# Patient Record
Sex: Male | Born: 1980 | Hispanic: No | Marital: Single
Health system: Southern US, Academic
[De-identification: ages and names within clinical notes are randomized; demographics above are authoritative.]

## PROBLEM LIST (undated history)

## (undated) ENCOUNTER — Ambulatory Visit
Payer: MEDICARE | Attending: Student in an Organized Health Care Education/Training Program | Primary: Student in an Organized Health Care Education/Training Program

## (undated) ENCOUNTER — Ambulatory Visit

## (undated) ENCOUNTER — Ambulatory Visit: Payer: MEDICARE

## (undated) ENCOUNTER — Telehealth

## (undated) ENCOUNTER — Encounter

## (undated) ENCOUNTER — Ambulatory Visit: Payer: MEDICARE | Attending: Pulmonary Disease | Primary: Pulmonary Disease

## (undated) ENCOUNTER — Inpatient Hospital Stay

## (undated) ENCOUNTER — Ambulatory Visit: Attending: Otolaryngology | Primary: Otolaryngology

## (undated) ENCOUNTER — Encounter: Attending: Family | Primary: Family

## (undated) ENCOUNTER — Ambulatory Visit: Payer: MEDICARE | Attending: Otolaryngology | Primary: Otolaryngology

## (undated) ENCOUNTER — Ambulatory Visit: Payer: MEDICARE | Attending: Nephrology | Primary: Nephrology

## (undated) ENCOUNTER — Encounter: Attending: Cardiovascular Disease | Primary: Cardiovascular Disease

## (undated) ENCOUNTER — Encounter: Attending: Pulmonary Disease | Primary: Pulmonary Disease

## (undated) ENCOUNTER — Encounter: Attending: Nephrology | Primary: Nephrology

## (undated) ENCOUNTER — Ambulatory Visit: Attending: Family | Primary: Family

## (undated) ENCOUNTER — Other Ambulatory Visit

## (undated) ENCOUNTER — Ambulatory Visit: Payer: MEDICARE | Attending: Cardiovascular Disease | Primary: Cardiovascular Disease

## (undated) ENCOUNTER — Encounter
Attending: Student in an Organized Health Care Education/Training Program | Primary: Student in an Organized Health Care Education/Training Program

## (undated) ENCOUNTER — Telehealth: Attending: Family | Primary: Family

## (undated) ENCOUNTER — Ambulatory Visit: Payer: MEDICARE | Attending: Surgery | Primary: Surgery

## (undated) ENCOUNTER — Telehealth: Attending: Otolaryngology | Primary: Otolaryngology

## (undated) ENCOUNTER — Encounter: Attending: Surgery | Primary: Surgery

## (undated) ENCOUNTER — Telehealth: Attending: Internal Medicine | Primary: Internal Medicine

## (undated) ENCOUNTER — Ambulatory Visit: Payer: MEDICARE | Attending: "Endocrinology | Primary: "Endocrinology

## (undated) ENCOUNTER — Telehealth: Attending: Nephrology | Primary: Nephrology

## (undated) ENCOUNTER — Ambulatory Visit: Attending: Nephrology | Primary: Nephrology

## (undated) DIAGNOSIS — I10 Essential (primary) hypertension: Secondary | ICD-10-CM

## (undated) DIAGNOSIS — R509 Fever, unspecified: Secondary | ICD-10-CM

## (undated) DIAGNOSIS — A630 Anogenital (venereal) warts: Secondary | ICD-10-CM

## (undated) DIAGNOSIS — B20 Human immunodeficiency virus [HIV] disease: Secondary | ICD-10-CM

## (undated) DIAGNOSIS — J189 Pneumonia, unspecified organism: Secondary | ICD-10-CM

## (undated) DIAGNOSIS — J45909 Unspecified asthma, uncomplicated: Secondary | ICD-10-CM

## (undated) DIAGNOSIS — R51 Headache: Secondary | ICD-10-CM

## (undated) DIAGNOSIS — Z91018 Allergy to other foods: Secondary | ICD-10-CM

## (undated) DIAGNOSIS — Z992 Dependence on renal dialysis: Secondary | ICD-10-CM

## (undated) DIAGNOSIS — Z23 Encounter for immunization: Secondary | ICD-10-CM

## (undated) DIAGNOSIS — Z21 Asymptomatic human immunodeficiency virus [HIV] infection status: Secondary | ICD-10-CM

## (undated) DIAGNOSIS — B029 Zoster without complications: Secondary | ICD-10-CM

## (undated) DIAGNOSIS — R519 Headache, unspecified: Secondary | ICD-10-CM

## (undated) DIAGNOSIS — I82409 Acute embolism and thrombosis of unspecified deep veins of unspecified lower extremity: Secondary | ICD-10-CM

## (undated) DIAGNOSIS — Z9109 Other allergy status, other than to drugs and biological substances: Secondary | ICD-10-CM

## (undated) DIAGNOSIS — R569 Unspecified convulsions: Secondary | ICD-10-CM

## (undated) DIAGNOSIS — M79606 Pain in leg, unspecified: Secondary | ICD-10-CM

## (undated) DIAGNOSIS — L0390 Cellulitis, unspecified: Secondary | ICD-10-CM

## (undated) DIAGNOSIS — N186 End stage renal disease: Secondary | ICD-10-CM

## (undated) HISTORY — PX: FRACTURE SURGERY: SHX138

## (undated) HISTORY — DX: Encounter for immunization: Z23

## (undated) HISTORY — DX: Anogenital (venereal) warts: A63.0

---
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
## 2002-04-06 ENCOUNTER — Emergency Department (HOSPITAL_COMMUNITY): Admission: EM | Admit: 2002-04-06 | Discharge: 2002-04-06 | Payer: Self-pay | Admitting: Emergency Medicine

## 2002-04-06 ENCOUNTER — Encounter: Payer: Self-pay | Admitting: Emergency Medicine

## 2005-05-18 ENCOUNTER — Emergency Department (HOSPITAL_COMMUNITY): Admission: EM | Admit: 2005-05-18 | Discharge: 2005-05-18 | Payer: Self-pay | Admitting: Emergency Medicine

## 2005-05-20 ENCOUNTER — Emergency Department (HOSPITAL_COMMUNITY): Admission: EM | Admit: 2005-05-20 | Discharge: 2005-05-21 | Payer: Self-pay | Admitting: Emergency Medicine

## 2008-04-10 ENCOUNTER — Emergency Department (HOSPITAL_COMMUNITY): Admission: EM | Admit: 2008-04-10 | Discharge: 2008-04-10 | Payer: Self-pay | Admitting: Emergency Medicine

## 2009-10-19 ENCOUNTER — Emergency Department (HOSPITAL_COMMUNITY): Admission: EM | Admit: 2009-10-19 | Discharge: 2009-10-19 | Payer: Self-pay | Admitting: Emergency Medicine

## 2010-07-30 ENCOUNTER — Emergency Department (HOSPITAL_COMMUNITY): Payer: BC Managed Care – PPO

## 2010-07-30 ENCOUNTER — Emergency Department (HOSPITAL_COMMUNITY)
Admission: EM | Admit: 2010-07-30 | Discharge: 2010-07-30 | Disposition: A | Discharge status: Home / Self Care | Payer: BC Managed Care – PPO | Attending: Emergency Medicine | Admitting: Emergency Medicine

## 2010-07-30 DIAGNOSIS — J45909 Unspecified asthma, uncomplicated: Secondary | ICD-10-CM | POA: Insufficient documentation

## 2010-07-30 DIAGNOSIS — R51 Headache: Secondary | ICD-10-CM | POA: Insufficient documentation

## 2010-07-30 DIAGNOSIS — R059 Cough, unspecified: Secondary | ICD-10-CM | POA: Insufficient documentation

## 2010-07-30 DIAGNOSIS — IMO0001 Reserved for inherently not codable concepts without codable children: Secondary | ICD-10-CM | POA: Insufficient documentation

## 2010-07-30 DIAGNOSIS — R079 Chest pain, unspecified: Secondary | ICD-10-CM | POA: Insufficient documentation

## 2010-07-30 DIAGNOSIS — R05 Cough: Secondary | ICD-10-CM | POA: Insufficient documentation

## 2010-07-30 DIAGNOSIS — J189 Pneumonia, unspecified organism: Secondary | ICD-10-CM | POA: Insufficient documentation

## 2010-07-30 LAB — POCT I-STAT, CHEM 8
BUN: 15 mg/dL (ref 6–23)
Calcium, Ion: 1.19 mmol/L (ref 1.12–1.32)
Chloride: 100 mEq/L (ref 96–112)
Creatinine, Ser: 1.4 mg/dL (ref 0.4–1.5)
Glucose, Bld: 134 mg/dL — ABNORMAL HIGH (ref 70–99)
HCT: 45 % (ref 39.0–52.0)
Hemoglobin: 15.3 g/dL (ref 13.0–17.0)
Potassium: 4.5 mEq/L (ref 3.5–5.1)
Sodium: 136 mEq/L (ref 135–145)
TCO2: 27 mmol/L (ref 0–100)

## 2010-07-30 LAB — CBC
HCT: 39.5 % (ref 39.0–52.0)
Hemoglobin: 14 g/dL (ref 13.0–17.0)
MCH: 29.2 pg (ref 26.0–34.0)
MCHC: 35.4 g/dL (ref 30.0–36.0)
MCV: 82.5 fL (ref 78.0–100.0)
Platelets: 148 10*3/uL — ABNORMAL LOW (ref 150–400)
RBC: 4.79 MIL/uL (ref 4.22–5.81)
RDW: 13.2 % (ref 11.5–15.5)
WBC: 16.3 10*3/uL — ABNORMAL HIGH (ref 4.0–10.5)

## 2010-07-30 LAB — DIFFERENTIAL
Basophils Absolute: 0 10*3/uL (ref 0.0–0.1)
Basophils Relative: 0 % (ref 0–1)
Eosinophils Absolute: 0 10*3/uL (ref 0.0–0.7)
Eosinophils Relative: 0 % (ref 0–5)
Lymphocytes Relative: 3 % — ABNORMAL LOW (ref 12–46)
Lymphs Abs: 0.4 10*3/uL — ABNORMAL LOW (ref 0.7–4.0)
Monocytes Absolute: 1 10*3/uL (ref 0.1–1.0)
Monocytes Relative: 6 % (ref 3–12)
Neutro Abs: 14.9 10*3/uL — ABNORMAL HIGH (ref 1.7–7.7)
Neutrophils Relative %: 91 % — ABNORMAL HIGH (ref 43–77)

## 2013-09-03 ENCOUNTER — Emergency Department (HOSPITAL_COMMUNITY): Payer: BC Managed Care – PPO

## 2013-09-03 ENCOUNTER — Emergency Department (HOSPITAL_COMMUNITY)
Admission: EM | Admit: 2013-09-03 | Discharge: 2013-09-03 | Disposition: A | Discharge status: Home / Self Care | Payer: BC Managed Care – PPO | Attending: Emergency Medicine | Admitting: Emergency Medicine

## 2013-09-03 ENCOUNTER — Encounter (HOSPITAL_COMMUNITY): Payer: Self-pay | Admitting: Emergency Medicine

## 2013-09-03 DIAGNOSIS — N12 Tubulo-interstitial nephritis, not specified as acute or chronic: Secondary | ICD-10-CM

## 2013-09-03 DIAGNOSIS — J45909 Unspecified asthma, uncomplicated: Secondary | ICD-10-CM | POA: Insufficient documentation

## 2013-09-03 DIAGNOSIS — Z79899 Other long term (current) drug therapy: Secondary | ICD-10-CM | POA: Insufficient documentation

## 2013-09-03 HISTORY — DX: Unspecified asthma, uncomplicated: J45.909

## 2013-09-03 LAB — I-STAT CHEM 8, ED
BUN: 6 mg/dL (ref 6–23)
Calcium, Ion: 1.19 mmol/L (ref 1.12–1.23)
Chloride: 104 mEq/L (ref 96–112)
Creatinine, Ser: 1 mg/dL (ref 0.50–1.35)
Glucose, Bld: 107 mg/dL — ABNORMAL HIGH (ref 70–99)
HCT: 42 % (ref 39.0–52.0)
Hemoglobin: 14.3 g/dL (ref 13.0–17.0)
Potassium: 4 mEq/L (ref 3.7–5.3)
Sodium: 139 mEq/L (ref 137–147)
TCO2: 24 mmol/L (ref 0–100)

## 2013-09-03 LAB — CBC WITH DIFFERENTIAL/PLATELET
Basophils Absolute: 0 10*3/uL (ref 0.0–0.1)
Basophils Relative: 0 % (ref 0–1)
Eosinophils Absolute: 0.1 10*3/uL (ref 0.0–0.7)
Eosinophils Relative: 3 % (ref 0–5)
HCT: 38.8 % — ABNORMAL LOW (ref 39.0–52.0)
Hemoglobin: 13.4 g/dL (ref 13.0–17.0)
Lymphocytes Relative: 25 % (ref 12–46)
Lymphs Abs: 1.3 10*3/uL (ref 0.7–4.0)
MCH: 28 pg (ref 26.0–34.0)
MCHC: 34.5 g/dL (ref 30.0–36.0)
MCV: 81 fL (ref 78.0–100.0)
Monocytes Absolute: 0.6 10*3/uL (ref 0.1–1.0)
Monocytes Relative: 12 % (ref 3–12)
Neutro Abs: 3.2 10*3/uL (ref 1.7–7.7)
Neutrophils Relative %: 60 % (ref 43–77)
Platelets: 213 10*3/uL (ref 150–400)
RBC: 4.79 MIL/uL (ref 4.22–5.81)
RDW: 13.5 % (ref 11.5–15.5)
WBC: 5.3 10*3/uL (ref 4.0–10.5)

## 2013-09-03 LAB — URINE MICROSCOPIC-ADD ON

## 2013-09-03 LAB — HEPATIC FUNCTION PANEL
ALT: 45 U/L (ref 0–53)
AST: 58 U/L — ABNORMAL HIGH (ref 0–37)
Albumin: 3 g/dL — ABNORMAL LOW (ref 3.5–5.2)
Alkaline Phosphatase: 52 U/L (ref 39–117)
Bilirubin, Direct: <0.2 mg/dL (ref 0.0–0.3)
Total Bilirubin: 0.5 mg/dL (ref 0.3–1.2)
Total Protein: 8.8 g/dL — ABNORMAL HIGH (ref 6.0–8.3)

## 2013-09-03 LAB — URINALYSIS, ROUTINE W REFLEX MICROSCOPIC
Bilirubin Urine: NEGATIVE
Glucose, UA: NEGATIVE mg/dL
Ketones, ur: NEGATIVE mg/dL
Nitrite: NEGATIVE
Protein, ur: 100 mg/dL — AB
Specific Gravity, Urine: 1.018 (ref 1.005–1.030)
Urobilinogen, UA: 0.2 mg/dL (ref 0.0–1.0)
pH: 7.5 (ref 5.0–8.0)

## 2013-09-03 LAB — LIPASE, BLOOD: Lipase: 39 U/L (ref 11–59)

## 2013-09-03 MED ORDER — HYDROCODONE-ACETAMINOPHEN 5-325 MG PO TABS: 1.0000 | ORAL_TABLET | Freq: Four times a day (QID) | ORAL | Status: DC | PRN

## 2013-09-03 MED ORDER — ACETAMINOPHEN 325 MG PO TABS
650.0000 mg | ORAL_TABLET | Freq: Once | ORAL | Status: AC
  Administered 2013-09-03: 650 mg via ORAL
  Filled 2013-09-03: qty 2

## 2013-09-03 MED ORDER — CIPROFLOXACIN HCL 500 MG PO TABS: 500.0000 mg | ORAL_TABLET | Freq: Two times a day (BID) | ORAL | Status: DC

## 2013-09-03 MED ORDER — CIPROFLOXACIN IN D5W 400 MG/200ML IV SOLN
400.0000 mg | Freq: Once | INTRAVENOUS | Status: AC
  Administered 2013-09-03: 400 mg via INTRAVENOUS
  Filled 2013-09-03: qty 200

## 2013-09-03 NOTE — ED Provider Notes (Signed)
Medical screening examination/treatment/procedure(s) were performed by non-physician practitioner and as supervising physician I was immediately available for consultation/collaboration.   EKG Interpretation None        Fredia Sorrow, MD 09/03/13 [phone]

## 2013-09-03 NOTE — ED Notes (Signed)
No report of nausea.

## 2013-09-03 NOTE — Discharge Instructions (Signed)
Please call your doctor for a followup appointment within 24-48 hours. When you talk to your doctor please let them know that you were seen in the emergency department and have them acquire all of your records so that they can discuss the findings with you and formulate a treatment plan to fully care for your new and ongoing problems. Please call and set-up an appointment with Urology Please take antibiotics as prescribed and on a full stomach  Please drink plenty of water  Please rest and stay hydrated  Please take medications as prescribed While on pain medications there is to be no drinking alcohol, driving, operating any heavy machinery. Please do not take any extra Tylenol with this medication for this can lead to Tylenol overdose and liver issues.  Please continue to monitor symptoms closely and if symptoms are to worsen or change (fever greater than 101, chills, sweating, nausea, vomiting, chest pain, shortness of breath, difficulty breathing, numbness, tingling, nausea, vomiting, worsening or changes to pain pattern) please report back to the ED immediately    Pyelonephritis, Adult Pyelonephritis is a kidney infection. In general, there are 2 main types of pyelonephritis:  Infections that come on quickly without any warning (acute pyelonephritis).  Infections that persist for a long period of time (chronic pyelonephritis). CAUSES  Two main causes of pyelonephritis are:  Bacteria traveling from the bladder to the kidney. This is a problem especially in pregnant women. The urine in the bladder can become filled with bacteria from multiple causes, including:  Inflammation of the prostate gland (prostatitis).  Sexual intercourse in females.  Bladder infection (cystitis).  Bacteria traveling from the bloodstream to the tissue part of the kidney. Problems that may increase your risk of getting a kidney infection include:  Diabetes.  Kidney stones or bladder  stones.  Cancer.  Catheters placed in the bladder.  Other abnormalities of the kidney or ureter. SYMPTOMS   Abdominal pain.  Pain in the side or flank area.  Fever.  Chills.  Upset stomach.  Blood in the urine (dark urine).  Frequent urination.  Strong or persistent urge to urinate.  Burning or stinging when urinating. DIAGNOSIS  Your caregiver may diagnose your kidney infection based on your symptoms. A urine sample may also be taken. TREATMENT  In general, treatment depends on how severe the infection is.   If the infection is mild and caught early, your caregiver may treat you with oral antibiotics and send you home.  If the infection is more severe, the bacteria may have gotten into the bloodstream. This will require intravenous (IV) antibiotics and a hospital stay. Symptoms may include:  High fever.  Severe flank pain.  Shaking chills.  Even after a hospital stay, your caregiver may require you to be on oral antibiotics for a period of time.  Other treatments may be required depending upon the cause of the infection. HOME CARE INSTRUCTIONS   Take your antibiotics as directed. Finish them even if you start to feel better.  Make an appointment to have your urine checked to make sure the infection is gone.  Drink enough fluids to keep your urine clear or pale yellow.  Take medicines for the bladder if you have urgency and frequency of urination as directed by your caregiver. SEEK IMMEDIATE MEDICAL CARE IF:   You have a fever or persistent symptoms for more than 2-3 days.  You have a fever and your symptoms suddenly get worse.  You are unable to take your antibiotics or  fluids.  You develop shaking chills.  You experience extreme weakness or fainting.  There is no improvement after 2 days of treatment. MAKE SURE YOU:  Understand these instructions.  Will watch your condition.  Will get help right away if you are not doing well or get  worse. Document Released: 02/13/2005 Document Revised: 08/15/2011 Document Reviewed: 07/20/2010 [facility] Patient Information 2015 [place], [place]. This information is not intended to replace advice given to you by your health care provider. Make sure you discuss any questions you have with your health care provider.   Emergency Department Resource Guide 1) Find a Doctor and Pay Out of Pocket Although you won't have to find out who is covered by your insurance plan, it is a good idea to ask around and get recommendations. You will then need to call the office and see if the doctor you have chosen will accept you as a new patient and what types of options they offer for patients who are self-pay. Some doctors offer discounts or will set up payment plans for their patients who do not have insurance, but you will need to ask so you aren't surprised when you get to your appointment.  2) Contact Your Local Health Department Not all health departments have doctors that can see patients for sick visits, but many do, so it is worth a call to see if yours does. If you don't know where your local health department is, you can check in your phone book. The CDC also has a tool to help you locate your state's health department, and many state websites also have listings of all of their local health departments.  3) Find a [facility] If your illness is not likely to be very severe or complicated, you may want to try a walk in clinic. These are popping up all over the country in pharmacies, drugstores, and shopping centers. They're usually staffed by nurse practitioners or physician assistants that have been trained to treat common illnesses and complaints. They're usually fairly quick and inexpensive. However, if you have serious medical issues or chronic medical problems, these are probably not your best option.  No Primary Care Doctor: - Call Health Connect at  [phone] - they can help you locate a primary  care doctor that  accepts your insurance, provides certain services, etc. - Physician Referral Service- [phone]  Chronic Pain Problems: Organization         Address  Phone   Notes  [facility]  [phone] Patients need to be referred by their primary care doctor.   Medication Assistance: Organization         Address  Phone   Notes  [facility] Medication [facility] [address] [place], [place] [address] [phone] --Must be a resident of [facility] -- Must have NO insurance coverage whatsoever (no Medicaid/ Medicare, etc.) -- The pt. MUST have a primary care doctor that directs their care regularly and follows them in the community   MedAssist  [phone]   [employer]  [phone]    Agencies that provide inexpensive medical care: Organization         Address  Phone   Notes  [place]  [phone]   [facility]    [phone]   [facility] [place],  [address] [phone]   [place] [address] [place] [phone]  Planned Parenthood    [phone]   [facility]    [phone]   Community Health and [address], [place] Phone:  [phone], Fax:  [phone] Hours of Operation:  9 am - 6 pm, M-F.  Also accepts Medicaid/Medicare and self-pay.  [facility] [place] [address], [address], [employer] Phone: [phone], Fax: [phone]. Hours of Operation:  8:30 am - 5:30 pm, M-F.  Also accepts Medicaid and self-pay.  [facility] [place] [address] [place] Phone: [phone]   [place], [place], [place] [phone], Ext. [phone] Mondays & Thursdays: 7-9 AM.  First 15 patients are seen on a first come, first serve basis.    [place]  Providers:  Organization         Address  Phone   Notes  [facility] [address], [place] [phone] Also accepts self-pay patients.  [facility] [id number] [place], [place]  [phone]   [place], [address], [place] [phone]   [facility] [address] [place] [phone]   Lucianne Lei [address], [place]   [phone] Only accepts [place] Access [place] patients after they have their name applied to their card.   Self-Pay (no insurance) in [facility]:  Organization         Address  Phone   Notes  Sickle Cell Patients, [facility] Internal Medicine [place] [phone]   [facility] [place] [phone]   [facility] [place]  [place], [place], [place] [phone]   [facility]/Dr. Osei-Bonsu  [address] [place] or [address], [place] [phone] Phone number for both [place] and [place] locations is the same.  Urgent Medical and [facility] [address], [place] [phone]   [facility] [address] [place] or [address] [phone]   [facility] [address], [place] [phone], phone; [phone], fax Sees patients 1st and 3rd Saturday of every month.  Must not qualify for public or private insurance (i.e. Medicaid, Medicare, [place] Health Choice, Veterans' Benefits)  Household income should be no more than 200% of the poverty level The clinic cannot treat you if you are pregnant or think you are pregnant  Sexually transmitted diseases are not treated at the clinic.    Dental Care: Organization         Address  Phone  Notes  [facility] of [facility] [employer] [phone] Accepts children up to age 61 who are enrolled in [place] or [place]; pregnant women with a Medicaid card; and children who have applied for Medicaid or [place] Health Choice, but were declined, whose parents can pay a reduced fee at time of service.  [facility] Department of [facility]  [address], [facility] [employer] [phone] Accepts children up to age 54 who are enrolled in [place] or [place]; pregnant women with a Medicaid card; and children who have applied for Medicaid or [place], but were declined,  whose parents can pay a reduced fee at time of service.  [place] Adult Dental Access PROGRAM  [place] [phone] Patients are seen by appointment only. Walk-ins are not accepted. [place] will see patients 25 years of age and older. Monday - Tuesday (8am-5pm) Most Wednesdays (8:30-5pm) $30 per visit, cash only  [facility] Adult Dental Access PROGRAM  [address], [facility] [phone] Patients are seen by appointment only. Walk-ins are not accepted. [place] will see patients 31 years of age and older. One Wednesday Evening (Monthly: Volunteer Based).  $30 per visit, cash only  [place]  [phone] for adults; Children under age 40, call Graduate Pediatric Dentistry at [phone]. Children aged 25-14, please call [phone] to request a pediatric application.  Dental services are provided in all areas of dental care including fillings, crowns and bridges, complete and partial dentures, implants, gum treatment, root canals, and extractions. Preventive care is also provided. Treatment is provided to both adults and children. Patients are selected via a lottery and there is often a waiting list.   [facility] [address] [place]  [phone] [email or web address]   [facility]  [address] [place], [place] [phone], Ext. [phone] Second and Fourth Thursday of each month, opens at 6:30 AM; Clinic ends at 9 AM.  Patients are seen on a first-come first-served basis, and a limited number are seen during each clinic.   [facility]  [address] Hillard Danker [place], [place] [phone]   Eligibility Requirements You must have lived in [place], [place], or [place] counties for at least the last three months.   You cannot be eligible for state or federal sponsored [employer], including [employer], [place], or [employer].   You generally cannot be eligible for healthcare insurance through your employer.    How to apply: Eligibility screenings are held every Tuesday and Wednesday afternoon from 1:00 pm until 4:00 pm. You do not need an appointment for the interview!  [facility] [address] [place], [place]   [place]  [place] Department  [place]  [phone]    Behavioral Health Resources in the Community: Intensive Outpatient Programs Organization         Address  Phone  Notes  [place]. [address] [place], [place] [phone]   [facility] Outpatient [address], [place], [place]   ADS: Alcohol & Drug Svcs [address] [place], [place]   [place] [address]  [place], [place] or [phone]   Substance Abuse Resources Organization         Address  Phone  Notes  Alcohol and Drug Services  [phone]   [place]  [phone]   [facility]   Chinita Pester  [phone]   Residential & Outpatient Substance Abuse Program  [phone]   Psychological Services Organization         Address  Phone  Notes  [facility] [place]  [place]  [phone]   [place] [address], [place] or [phone]    Mobile Crisis Teams Organization         Address  Phone  Notes  [facility]rapeutic Alternatives, Mobile  Crisis Care Unit  [phone]   [facility]  [address] [place], [place]   [facility] [address] [place] [phone]    Self-Help/Support Groups Organization         Address  Phone             Notes  [place]. of [place] - variety of support groups  [place] Call for more information  Narcotics Anonymous (NA), [facility] [address], [employer] [place]  2 meetings at this location   Special educational needs teacher         Address  Phone  Notes  ASAP Residential Treatment [place],    [place]  [phone]   [facility]  [address], [place] [id number], [place], [place]   [place], [place] [phone] Admissions: 8am-3pm M-F  Incentives Substance [place] [address],    [place], [place] [phone]   [facility] [address] [place], [place], [place]   [facility] [address]  [place], [place]   [facility]ight Programs - Intensive Outpatient [address], [place], [place]   [facility] ([place].,  [place], [place] [phone] or [phone]   [facility] Services (RTS) [address], [place], [place] Accepts Medicaid  [facility] [place] [address],  [place] [phone] Substance Abuse/Addiction Treatment   [facility] Organization         Address  Phone  Notes  [facility]  [phone]   Domenic Schwab, PhD [address] Arlis Porta [place], [place]   [phone] or [phone]    [place], [place] [phone]   [facility] [address] [place], [place] [phone] Insurance/Medicaid/sponsorship through [facility] and Families [address] [place]                                    [place], [place] [phone] [place] [address], [place] [phone]    Dr. Adele Schilder  [phone]   [facility] [place] Dept. 1) [address], [place] 2) [place] 3)  [place] [address], [place] [phone]  [phone]   [place] [phone] or [phone] (After Hours)

## 2013-09-03 NOTE — ED Notes (Signed)
Patient with abdominal pain that started at work yesterday afternoon.  Patient states he did vomit x1, which did not help with the pain.  Patient has taken APAP over the counter for pain, with no relief.

## 2013-09-03 NOTE — ED Notes (Signed)
Pt transported to CT ?

## 2013-09-03 NOTE — ED Provider Notes (Signed)
CSN: [id number]     Arrival date & time 09/03/13  0351 History   First MD Initiated Contact with Patient 09/03/13 [phone]     Chief Complaint  Patient presents with  . Abdominal Pain     (Consider location/radiation/quality/duration/timing/severity/associated sxs/prior Treatment) The history is provided by the patient. No language interpreter was used.  Jim Wyatt is a 33 y/o M with PMHx of asthma presenting to the ED with abdominal pain that started yesterday at approximately 3:30 PM. As per patient, reported that the abdominal pain is generalized, with more discomfort to the left side - described as a sharp pain that is intermittent with radiation to the lower back. Stated that he has had one episode of emesis this morning - NB/NB. Reported that he has noticed some blood in his urine this morning and was concerned. Reported that he has had multiple episodes of loose stools - mainly brown and watery. Stated that he has been using Tylenol for his pain with minimal relief. Stated that he does drink a lot of sweet tea. Denied fever, chills, melena, hematochezia, dysuria, chest pain, shortness of breath, difficulty breathing, leg swelling, penile pain/discharge/swelling.  PCP none   Past Medical History  Diagnosis Date  . Asthma    History reviewed. No pertinent past surgical history. History reviewed. No pertinent family history. History  Substance Use Topics  . Smoking status: Never Smoker   . Smokeless tobacco: Not on file  . Alcohol Use: No    Review of Systems  Constitutional: Negative for fever and chills.  Respiratory: Negative for chest tightness and shortness of breath.   Cardiovascular: Negative for chest pain.  Gastrointestinal: Positive for nausea, vomiting and abdominal pain. Negative for diarrhea, constipation, blood in stool and anal bleeding.  Genitourinary: Positive for hematuria. Negative for dysuria, flank pain, decreased urine volume, discharge, penile swelling,  scrotal swelling, penile pain and testicular pain.  Musculoskeletal: Positive for back pain. Negative for neck pain.  Neurological: Negative for weakness and numbness.      Allergies  Augmentin  Home Medications   Prior to Admission medications   Medication Sig Start Date End Date Taking? Authorizing Provider  albuterol (PROVENTIL HFA;VENTOLIN HFA) 108 (90 BASE) MCG/ACT inhaler Inhale 2 puffs into the lungs every 6 (six) hours as needed for wheezing or shortness of breath.   Yes Historical Provider, MD  ciprofloxacin (CIPRO) 500 MG tablet Take 1 tablet (500 mg total) by mouth 2 (two) times daily. 09/03/13   Maye Parkinson, PA-C  HYDROcodone-acetaminophen (NORCO/VICODIN) 5-325 MG per tablet Take 1 tablet by mouth every 6 (six) hours as needed for moderate pain or severe pain. 09/03/13   Happy [place], PA-C   BP 135/82  Pulse 66  Temp(Src) 97.9 F (36.6 C) (Oral)  Resp 16  SpO2 98% Physical Exam  Nursing note and vitals reviewed. Constitutional: He is oriented to person, place, and time. He appears well-developed and well-nourished. No distress.  HENT:  Head: Normocephalic and atraumatic.  Mouth/Throat: Oropharynx is clear and moist. No oropharyngeal exudate.  Eyes: Conjunctivae and EOM are normal. Pupils are equal, round, and reactive to light. Right eye exhibits no discharge. Left eye exhibits no discharge.  Neck: Normal range of motion. Neck supple. No tracheal deviation present.  Cardiovascular: Normal rate, regular rhythm and normal heart sounds.  Exam reveals no friction rub.   No murmur heard. Pulses:      Radial pulses are 2+ on the right side, and 2+ on the left side.  Pulmonary/Chest: Effort normal and breath sounds normal. No respiratory distress. He has no wheezes. He has no rales.  Abdominal: Soft. Bowel sounds are normal. He exhibits no distension. There is tenderness in the left lower quadrant. There is CVA tenderness. There is no rebound and no guarding.     Negative abdominal distension  BS normoactive in all 4 quadrants Abdomen soft to palpation  Discomfort upon palpation to the left side of the abdomen - mainly the LLQ Positive left sided CVA tenderness Negative peritoneal signs Negative rigidity or guarding noted  Musculoskeletal: Normal range of motion. He exhibits no edema and no tenderness.  Full ROM to upper and lower extremities without difficulty noted, negative ataxia noted.  Lymphadenopathy:    He has no cervical adenopathy.  Neurological: He is alert and oriented to person, place, and time. No cranial nerve deficit. He exhibits normal muscle tone. Coordination normal.  Skin: Skin is warm and dry. No rash noted. He is not diaphoretic. No erythema.  Psychiatric: He has a normal mood and affect. His behavior is normal. Thought content normal.    ED Course  Procedures (including critical care time)  Results for orders placed during the hospital encounter of 09/03/13  CBC WITH DIFFERENTIAL      Result Value Ref Range   WBC 5.3  4.0 - 10.5 K/uL   RBC 4.79  4.22 - 5.81 MIL/uL   Hemoglobin 13.4  13.0 - 17.0 g/dL   HCT 38.8 (*) 39.0 - 52.0 %   MCV 81.0  78.0 - 100.0 fL   MCH 28.0  26.0 - 34.0 pg   MCHC 34.5  30.0 - 36.0 g/dL   RDW 13.5  11.5 - 15.5 %   Platelets 213  150 - 400 K/uL   Neutrophils Relative % 60  43 - 77 %   Neutro Abs 3.2  1.7 - 7.7 K/uL   Lymphocytes Relative 25  12 - 46 %   Lymphs Abs 1.3  0.7 - 4.0 K/uL   Monocytes Relative 12  3 - 12 %   Monocytes Absolute 0.6  0.1 - 1.0 K/uL   Eosinophils Relative 3  0 - 5 %   Eosinophils Absolute 0.1  0.0 - 0.7 K/uL   Basophils Relative 0  0 - 1 %   Basophils Absolute 0.0  0.0 - 0.1 K/uL  URINALYSIS, ROUTINE W REFLEX MICROSCOPIC      Result Value Ref Range   Color, Urine YELLOW  YELLOW   APPearance CLOUDY (*) CLEAR   Specific Gravity, Urine 1.018  1.005 - 1.030   pH 7.5  5.0 - 8.0   Glucose, UA NEGATIVE  NEGATIVE mg/dL   Hgb urine dipstick TRACE (*) NEGATIVE    Bilirubin Urine NEGATIVE  NEGATIVE   Ketones, ur NEGATIVE  NEGATIVE mg/dL   Protein, ur 100 (*) NEGATIVE mg/dL   Urobilinogen, UA 0.2  0.0 - 1.0 mg/dL   Nitrite NEGATIVE  NEGATIVE   Leukocytes, UA MODERATE (*) NEGATIVE  LIPASE, BLOOD      Result Value Ref Range   Lipase 39  11 - 59 U/L  HEPATIC FUNCTION PANEL      Result Value Ref Range   Total Protein 8.8 (*) 6.0 - 8.3 g/dL   Albumin 3.0 (*) 3.5 - 5.2 g/dL   AST 58 (*) 0 - 37 U/L   ALT 45  0 - 53 U/L   Alkaline Phosphatase 52  39 - 117 U/L   Total Bilirubin 0.5  0.3 -  1.2 mg/dL   Bilirubin, Direct <0.2  0.0 - 0.3 mg/dL   Indirect Bilirubin NOT CALCULATED  0.3 - 0.9 mg/dL  URINE MICROSCOPIC-ADD ON      Result Value Ref Range   WBC, UA TOO NUMEROUS TO COUNT  <3 WBC/hpf   RBC / HPF 3-6  <3 RBC/hpf   Bacteria, UA MANY (*) RARE  I-STAT CHEM 8, ED      Result Value Ref Range   Sodium 139  137 - 147 mEq/L   Potassium 4.0  3.7 - 5.3 mEq/L   Chloride 104  96 - 112 mEq/L   BUN 6  6 - 23 mg/dL   Creatinine, Ser 1.00  0.50 - 1.35 mg/dL   Glucose, Bld 107 (*) 70 - 99 mg/dL   Calcium, Ion 1.19  1.12 - 1.23 mmol/L   TCO2 24  0 - 100 mmol/L   Hemoglobin 14.3  13.0 - 17.0 g/dL   HCT 42.0  39.0 - 52.0 %    Labs Review Labs Reviewed  CBC WITH DIFFERENTIAL - Abnormal; Notable for the following:    HCT 38.8 (*)    All other components within normal limits  URINALYSIS, ROUTINE W REFLEX MICROSCOPIC - Abnormal; Notable for the following:    APPearance CLOUDY (*)    Hgb urine dipstick TRACE (*)    Protein, ur 100 (*)    Leukocytes, UA MODERATE (*)    All other components within normal limits  HEPATIC FUNCTION PANEL - Abnormal; Notable for the following:    Total Protein 8.8 (*)    Albumin 3.0 (*)    AST 58 (*)    All other components within normal limits  URINE MICROSCOPIC-ADD ON - Abnormal; Notable for the following:    Bacteria, UA MANY (*)    All other components within normal limits  I-STAT CHEM 8, ED - Abnormal; Notable for  the following:    Glucose, Bld 107 (*)    All other components within normal limits  URINE CULTURE  LIPASE, BLOOD    Imaging Review Ct Abdomen Pelvis Wo Contrast  09/03/2013   CLINICAL DATA:  Abdominal pain  EXAM: CT ABDOMEN AND PELVIS WITHOUT CONTRAST  TECHNIQUE: Multidetector CT imaging of the abdomen and pelvis was performed following the standard protocol without IV contrast.  COMPARISON:  None.  FINDINGS: The lung bases are free of acute infiltrate or sizable effusion.  The liver, gallbladder, spleen, adrenal glands and pancreas are within normal limits. The kidneys are well visualized bilaterally. No renal calculi or obstructive changes are seen. The ureters are within normal limits bilaterally.  A few small appendicoliths are seen although no appendiceal dilatation is noted. Mild diverticular change is noted without definitive diverticulitis. The bladder is well distended. No pelvic mass lesion or sidewall abnormality is noted. Multiple inguinal lymph nodes are noted which are likely reactive in nature. A few small iliac lymph nodes are noted as well also likely reactive in nature.  Postsurgical changes are noted in the proximal right femur. A few sclerotic lesions are noted within the left iliac bone likely representing bone islands.  IMPRESSION: Scattered ankle lymph nodes likely reactive in nature. Clinical correlation and follow-up is recommended.  Scattered sclerotic densities within the left iliac bone likely representing bone islands. Short-term plain film follow-up is recommended in 3-6 months to assess for stability.  Diverticulosis without diverticulitis. Appendicolith without appendicitis.   Electronically Signed   By: Inez Catalina M.D.   On: 09/03/2013 08:48  EKG Interpretation None      MDM   Final diagnoses:  Pyelonephritis    Medications  ciprofloxacin (CIPRO) IVPB 400 mg (0 mg Intravenous Stopped 09/03/13 0956)  acetaminophen (TYLENOL) tablet 650 mg (650 mg Oral Given  09/03/13 1007)    Filed Vitals:   09/03/13 0930 09/03/13 0946 09/03/13 1000 09/03/13 1015  BP: 135/73 122/65 127/61 135/82  Pulse: 73 64 65 66  Temp:      TempSrc:      Resp: 16  16 16   SpO2: 97% 98% 99% 98%   CBC negative elevated white blood cell count-negative left shift or leukocytosis noted. Chem 8 noted a properly balanced electrolytes. BUN and creatinine elevation. Hepatic function panel noted mildly elevated AST of 58. Negative elevated bilirubin or alkaline phosphate. Lipase negative elevation. Urinalysis noted trace of Hgb with many bacteria and WBC too numerous to count. Urine culture pending. CT abdomen and pelvis without contrast noted scattered inguinal lymph nodes are likely reactive in nature. Scattered sclerotic densities within the left iliac bone likely representing bone islands-followup plain film recommended in 3-6 months. Diverticulosis without findings of diverticulitis. Appendicolith without appendicitis. Patient started on IV antibiotics, ciprofloxacin given secondary to possible pyelonephritis-cannot rule out possible prostatitis. Patient given IV fluids. Discussed case in great detail with attending physician, Dr. Gloris Manchester, as per physician recommended antibiotics to be administered in the ED setting and for patient to be discharged with antibiotics and for patient to follow-up with Urology. Attending physician reviewed labs and imaging.  Doubt pancreatitis. Doubt appendicitis. Doubt diverticulitis. Doubt colitis. Suspicion to be possible pyelonephritis versus prostatitis. Patient stable, afebrile. Patient not septic appearing. Discharged patient with antibiotics. For to help him on the center and neurology. Discussed with patient to rest and stay hydrated. Discussed with patient to closely monitor symptoms and if symptoms are to worsen or change to report back to the ED - strict return instructions given.  Patient agreed to plan of care, understood, all questions  answered.   Jamse Mead, PA-C 09/03/13 1253

## 2013-09-03 NOTE — ED Notes (Signed)
Given patient water, crackers, apple sauce and [place] sandwich for oral and food challenge.

## 2013-09-04 LAB — URINE CULTURE
Colony Count: 6000
Special Requests: NORMAL

## 2013-10-28 DIAGNOSIS — I82409 Acute embolism and thrombosis of unspecified deep veins of unspecified lower extremity: Secondary | ICD-10-CM

## 2013-10-28 DIAGNOSIS — J189 Pneumonia, unspecified organism: Secondary | ICD-10-CM

## 2013-10-28 HISTORY — DX: Acute embolism and thrombosis of unspecified deep veins of unspecified lower extremity: I82.409

## 2013-10-28 HISTORY — DX: Pneumonia, unspecified organism: J18.9

## 2013-11-01 ENCOUNTER — Emergency Department (HOSPITAL_COMMUNITY)
Admission: EM | Admit: 2013-11-01 | Discharge: 2013-11-02 | Disposition: A | Discharge status: Home / Self Care | Payer: BC Managed Care – PPO | Attending: Emergency Medicine | Admitting: Emergency Medicine

## 2013-11-01 ENCOUNTER — Encounter (HOSPITAL_COMMUNITY): Payer: Self-pay | Admitting: Emergency Medicine

## 2013-11-01 DIAGNOSIS — J189 Pneumonia, unspecified organism: Secondary | ICD-10-CM

## 2013-11-01 DIAGNOSIS — J159 Unspecified bacterial pneumonia: Secondary | ICD-10-CM | POA: Insufficient documentation

## 2013-11-01 DIAGNOSIS — R079 Chest pain, unspecified: Secondary | ICD-10-CM | POA: Insufficient documentation

## 2013-11-01 DIAGNOSIS — Z792 Long term (current) use of antibiotics: Secondary | ICD-10-CM | POA: Insufficient documentation

## 2013-11-01 DIAGNOSIS — J45901 Unspecified asthma with (acute) exacerbation: Secondary | ICD-10-CM | POA: Insufficient documentation

## 2013-11-01 NOTE — ED Notes (Addendum)
Pt. reports mid chest pain with SOB and dry cough onset Wednesday this week , denies nausea or diaphoresis . Pt. currently taking Prednisone for bronchitis.

## 2013-11-02 ENCOUNTER — Emergency Department (HOSPITAL_COMMUNITY): Payer: BC Managed Care – PPO

## 2013-11-02 LAB — I-STAT TROPONIN, ED: Troponin i, poc: 0 ng/mL (ref 0.00–0.08)

## 2013-11-02 MED ORDER — DOXYCYCLINE HYCLATE 100 MG PO CAPS: 100.0000 mg | ORAL_CAPSULE | Freq: Two times a day (BID) | ORAL | Status: DC

## 2013-11-02 NOTE — Discharge Instructions (Signed)
Limit sun exposure while taking antibiotic. Take Tylenol for pain. Stay well hydrated with fluids.  If you were given medicines take as directed.  If you are on coumadin or contraceptives realize their levels and effectiveness is altered by many different medicines.  If you have any reaction (rash, tongues swelling, other) to the medicines stop taking and see a physician.   Please follow up as directed and return to the ER or see a physician for new or worsening symptoms.  Thank you. Filed Vitals:   11/01/13 2336 11/01/13 2338 11/02/13 0000  BP: 136/82  131/74  Pulse: 87  80  Temp: 98.6 F (37 C)    TempSrc: Oral    Resp: 20  13  Height: 5\' 7"  (1.702 m)    Weight: 203 lb (92.08 kg)    SpO2: 99% 99% 95%

## 2013-11-02 NOTE — ED Provider Notes (Signed)
CSN: [id number]     Arrival date & time 11/01/13  2329 History   First MD Initiated Contact with Patient 11/01/13 2355     Chief Complaint  Patient presents with  . Chest Pain     (Consider location/radiation/quality/duration/timing/severity/associated sxs/prior Treatment) HPI Comments: 33 year old male with asthma history, no significant medical history otherwise, no smoking or alcohol presents with anterior chest ache, productive cough since Wednesday. Fairly constant. No cardiac history, no recent surgeries no leg swelling or leg pain no active cancer. Patient feels well otherwise and tolerating oral. Patient on prednisone for bronchitis diagnoses, no current antibiotics. Patient has had pneumonia in the past.  Patient is a 33 y.o. male presenting with chest pain. The history is provided by the patient.  Chest Pain Associated symptoms: cough and shortness of breath   Associated symptoms: no abdominal pain, no back pain, no fever, no headache and not vomiting     Past Medical History  Diagnosis Date  . Asthma    History reviewed. No pertinent past surgical history. No family history on file. History  Substance Use Topics  . Smoking status: Never Smoker   . Smokeless tobacco: Not on file  . Alcohol Use: No    Review of Systems  Constitutional: Negative for fever and chills.  HENT: Positive for congestion.   Eyes: Negative for visual disturbance.  Respiratory: Positive for cough and shortness of breath.   Cardiovascular: Positive for chest pain.  Gastrointestinal: Negative for vomiting and abdominal pain.  Genitourinary: Negative for dysuria and flank pain.  Musculoskeletal: Negative for back pain, neck pain and neck stiffness.  Skin: Negative for rash.  Neurological: Negative for light-headedness and headaches.      Allergies  Augmentin  Home Medications   Prior to Admission medications   Medication Sig Start Date End Date Taking? Authorizing Provider  albuterol  (PROVENTIL HFA;VENTOLIN HFA) 108 (90 BASE) MCG/ACT inhaler Inhale 2 puffs into the lungs every 6 (six) hours as needed for wheezing or shortness of breath.    Historical Provider, MD  ciprofloxacin (CIPRO) 500 MG tablet Take 1 tablet (500 mg total) by mouth 2 (two) times daily. 09/03/13   Marissa Sciacca, PA-C  HYDROcodone-acetaminophen (NORCO/VICODIN) 5-325 MG per tablet Take 1 tablet by mouth every 6 (six) hours as needed for moderate pain or severe pain. 09/03/13   Marissa Sciacca, PA-C   BP 136/82  Pulse 87  Temp(Src) 98.6 F (37 C) (Oral)  Resp 20  Ht 5\' 7"  (1.702 m)  Wt 203 lb (92.08 kg)  BMI 31.79 kg/m2  SpO2 99% Physical Exam  Nursing note and vitals reviewed. Constitutional: He is oriented to person, place, and time. He appears well-developed and well-nourished.  HENT:  Head: Normocephalic and atraumatic.  Eyes: Conjunctivae are normal. Right eye exhibits no discharge. Left eye exhibits no discharge.  Neck: Normal range of motion. Neck supple. No tracheal deviation present.  Cardiovascular: Normal rate and regular rhythm.   Pulmonary/Chest: Effort normal. He has rales (left lower).  Abdominal: Soft. He exhibits no distension. There is no tenderness. There is no guarding.  Musculoskeletal: He exhibits no edema and no tenderness.  Neurological: He is alert and oriented to person, place, and time.  Skin: Skin is warm. No rash noted.  Psychiatric: He has a normal mood and affect.    ED Course  Procedures (including critical care time) [place], ED    Imaging Review Dg Chest 2 View  11/02/2013   CLINICAL  DATA:  Chest pain, cough for 4 days.  EXAM: CHEST  2 VIEW  COMPARISON:  Chest radiograph July 30, 2010  FINDINGS: Patchy airspace opacities in left lung. No pleural effusions. Mild prominence of the central bronchovascular markings. No pneumothorax Cardiomediastinal silhouette is unremarkable. Soft tissue planes and included osseous structures  are nonsuspicious.  IMPRESSION: Patchy left lung airspace opacities concerning for pneumonia. Considering mild interstitial prominence this could reflect bronchopneumonia.   Electronically Signed   By: Elon Alas   On: 11/02/2013 00:25     EKG Interpretation   Date/Time:  Saturday November 01 2013 23:32:47 EDT Ventricular Rate:  79 PR Interval:  182 QRS Duration: 88 QT Interval:  374 QTC Calculation: 428 R Axis:   66 Text Interpretation:  Normal sinus rhythm Normal ECG Confirmed by Octavis Sheeler   MD, Mollie Rossano ([id number]) on 11/01/2013 11:56:29 PM      MDM   Final diagnoses:  CAP (community acquired pneumonia)   Clinically patient has pneumonia, confirmed on x-ray. I reviewed the chest x-ray showing infiltrate left lower lung. Patient very low risk for cardiac or blood clot. PE RC negative. EKG no acute findings. Plan for oral antibiotics and followup outpatient.  Results and differential diagnosis were discussed with the patient/parent/guardian. Close follow up outpatient was discussed, comfortable with the plan.   Medications - No data to display  Filed Vitals:   11/01/13 2336 11/01/13 2338  BP: 136/82   Pulse: 87   Temp: 98.6 F (37 C)   TempSrc: Oral   Resp: 20   Height: 5\' 7"  (1.702 m)   Weight: 203 lb (92.08 kg)   SpO2: 99% 99%         Mariea Clonts, MD 11/02/13 [phone]

## 2013-11-06 ENCOUNTER — Encounter (HOSPITAL_COMMUNITY): Payer: Self-pay | Admitting: Emergency Medicine

## 2013-11-06 DIAGNOSIS — R0902 Hypoxemia: Secondary | ICD-10-CM | POA: Diagnosis present

## 2013-11-06 DIAGNOSIS — K769 Liver disease, unspecified: Secondary | ICD-10-CM | POA: Diagnosis present

## 2013-11-06 DIAGNOSIS — K645 Perianal venous thrombosis: Secondary | ICD-10-CM | POA: Diagnosis present

## 2013-11-06 DIAGNOSIS — K2990 Gastroduodenitis, unspecified, without bleeding: Secondary | ICD-10-CM

## 2013-11-06 DIAGNOSIS — K439 Ventral hernia without obstruction or gangrene: Secondary | ICD-10-CM | POA: Diagnosis present

## 2013-11-06 DIAGNOSIS — J45909 Unspecified asthma, uncomplicated: Secondary | ICD-10-CM | POA: Diagnosis present

## 2013-11-06 DIAGNOSIS — R000 Tachycardia, unspecified: Secondary | ICD-10-CM | POA: Diagnosis present

## 2013-11-06 DIAGNOSIS — B59 Pneumocystosis: Secondary | ICD-10-CM | POA: Diagnosis present

## 2013-11-06 DIAGNOSIS — E875 Hyperkalemia: Secondary | ICD-10-CM | POA: Diagnosis present

## 2013-11-06 DIAGNOSIS — R319 Hematuria, unspecified: Secondary | ICD-10-CM | POA: Diagnosis present

## 2013-11-06 DIAGNOSIS — R197 Diarrhea, unspecified: Secondary | ICD-10-CM | POA: Diagnosis present

## 2013-11-06 DIAGNOSIS — R748 Abnormal levels of other serum enzymes: Secondary | ICD-10-CM | POA: Diagnosis present

## 2013-11-06 DIAGNOSIS — N170 Acute kidney failure with tubular necrosis: Secondary | ICD-10-CM | POA: Diagnosis not present

## 2013-11-06 DIAGNOSIS — B20 Human immunodeficiency virus [HIV] disease: Principal | ICD-10-CM | POA: Diagnosis present

## 2013-11-06 DIAGNOSIS — K2970 Gastritis, unspecified, without bleeding: Secondary | ICD-10-CM | POA: Diagnosis present

## 2013-11-06 DIAGNOSIS — E873 Alkalosis: Secondary | ICD-10-CM | POA: Diagnosis not present

## 2013-11-06 DIAGNOSIS — D696 Thrombocytopenia, unspecified: Secondary | ICD-10-CM | POA: Diagnosis present

## 2013-11-06 DIAGNOSIS — N032 Chronic nephritic syndrome with diffuse membranous glomerulonephritis: Secondary | ICD-10-CM | POA: Diagnosis present

## 2013-11-06 DIAGNOSIS — E874 Mixed disorder of acid-base balance: Secondary | ICD-10-CM | POA: Diagnosis not present

## 2013-11-06 DIAGNOSIS — B259 Cytomegaloviral disease, unspecified: Secondary | ICD-10-CM | POA: Diagnosis present

## 2013-11-06 NOTE — ED Notes (Addendum)
Pt. reports mid chest pain with SOB and dry cough for several days , seen here 5 days ago diagnosed with pneumonia discharged home .

## 2013-11-07 ENCOUNTER — Emergency Department (HOSPITAL_COMMUNITY): Payer: Self-pay

## 2013-11-07 ENCOUNTER — Inpatient Hospital Stay (HOSPITAL_COMMUNITY)
Admission: EM | Admit: 2013-11-07 | Discharge: 2013-11-25 | DRG: 974 | Disposition: A | Discharge status: Home / Self Care | Payer: Self-pay | Attending: Internal Medicine | Admitting: Internal Medicine

## 2013-11-07 DIAGNOSIS — B20 Human immunodeficiency virus [HIV] disease: Principal | ICD-10-CM

## 2013-11-07 DIAGNOSIS — N179 Acute kidney failure, unspecified: Secondary | ICD-10-CM

## 2013-11-07 DIAGNOSIS — Z8619 Personal history of other infectious and parasitic diseases: Secondary | ICD-10-CM | POA: Diagnosis present

## 2013-11-07 DIAGNOSIS — R319 Hematuria, unspecified: Secondary | ICD-10-CM

## 2013-11-07 DIAGNOSIS — I82409 Acute embolism and thrombosis of unspecified deep veins of unspecified lower extremity: Secondary | ICD-10-CM | POA: Diagnosis present

## 2013-11-07 DIAGNOSIS — J4520 Mild intermittent asthma, uncomplicated: Secondary | ICD-10-CM

## 2013-11-07 DIAGNOSIS — J45909 Unspecified asthma, uncomplicated: Secondary | ICD-10-CM | POA: Diagnosis present

## 2013-11-07 DIAGNOSIS — R748 Abnormal levels of other serum enzymes: Secondary | ICD-10-CM

## 2013-11-07 DIAGNOSIS — J189 Pneumonia, unspecified organism: Secondary | ICD-10-CM | POA: Diagnosis present

## 2013-11-07 DIAGNOSIS — B59 Pneumocystosis: Secondary | ICD-10-CM

## 2013-11-07 DIAGNOSIS — I82403 Acute embolism and thrombosis of unspecified deep veins of lower extremity, bilateral: Secondary | ICD-10-CM

## 2013-11-07 DIAGNOSIS — R109 Unspecified abdominal pain: Secondary | ICD-10-CM | POA: Diagnosis present

## 2013-11-07 DIAGNOSIS — R1013 Epigastric pain: Secondary | ICD-10-CM

## 2013-11-07 DIAGNOSIS — E669 Obesity, unspecified: Secondary | ICD-10-CM | POA: Diagnosis present

## 2013-11-07 DIAGNOSIS — Z21 Asymptomatic human immunodeficiency virus [HIV] infection status: Secondary | ICD-10-CM

## 2013-11-07 DIAGNOSIS — R509 Fever, unspecified: Secondary | ICD-10-CM

## 2013-11-07 HISTORY — DX: Human immunodeficiency virus (HIV) disease: B20

## 2013-11-07 HISTORY — DX: Asymptomatic human immunodeficiency virus (hiv) infection status: Z21

## 2013-11-07 HISTORY — DX: Zoster without complications: B02.9

## 2013-11-07 LAB — CBC WITH DIFFERENTIAL/PLATELET
Basophils Absolute: 0.1 10*3/uL (ref 0.0–0.1)
Basophils Relative: 1 % (ref 0–1)
Eosinophils Absolute: 0 10*3/uL (ref 0.0–0.7)
Eosinophils Relative: 0 % (ref 0–5)
HCT: 35.9 % — ABNORMAL LOW (ref 39.0–52.0)
Hemoglobin: 12.2 g/dL — ABNORMAL LOW (ref 13.0–17.0)
Lymphocytes Relative: 20 % (ref 12–46)
Lymphs Abs: 1.6 10*3/uL (ref 0.7–4.0)
MCH: 27.4 pg (ref 26.0–34.0)
MCHC: 34 g/dL (ref 30.0–36.0)
MCV: 80.5 fL (ref 78.0–100.0)
Monocytes Absolute: 0.4 10*3/uL (ref 0.1–1.0)
Monocytes Relative: 5 % (ref 3–12)
Neutro Abs: 5.8 10*3/uL (ref 1.7–7.7)
Neutrophils Relative %: 74 % (ref 43–77)
Platelets: 152 10*3/uL (ref 150–400)
RBC: 4.46 MIL/uL (ref 4.22–5.81)
RDW: 13.8 % (ref 11.5–15.5)
WBC Morphology: INCREASED
WBC: 7.9 10*3/uL (ref 4.0–10.5)

## 2013-11-07 LAB — STREP PNEUMONIAE URINARY ANTIGEN: Strep Pneumo Urinary Antigen: NEGATIVE

## 2013-11-07 LAB — URINALYSIS, ROUTINE W REFLEX MICROSCOPIC
Bilirubin Urine: NEGATIVE
Bilirubin Urine: NEGATIVE
Glucose, UA: NEGATIVE mg/dL
Glucose, UA: 250 mg/dL — AB
Ketones, ur: NEGATIVE mg/dL
Ketones, ur: 40 mg/dL — AB
Leukocytes, UA: NEGATIVE
Nitrite: NEGATIVE
Nitrite: POSITIVE — AB
Protein, ur: 100 mg/dL — AB
Protein, ur: >300 mg/dL — AB
Specific Gravity, Urine: 1.015 (ref 1.005–1.030)
Specific Gravity, Urine: 1.01 (ref 1.005–1.030)
Urobilinogen, UA: 0.2 mg/dL (ref 0.0–1.0)
Urobilinogen, UA: 4 mg/dL — ABNORMAL HIGH (ref 0.0–1.0)
pH: 7.5 (ref 5.0–8.0)
pH: 8.5 — ABNORMAL HIGH (ref 5.0–8.0)

## 2013-11-07 LAB — COMPREHENSIVE METABOLIC PANEL
ALT: 64 U/L — ABNORMAL HIGH (ref 0–53)
ALT: 51 U/L (ref 0–53)
AST: 68 U/L — ABNORMAL HIGH (ref 0–37)
AST: 49 U/L — ABNORMAL HIGH (ref 0–37)
Albumin: 2.5 g/dL — ABNORMAL LOW (ref 3.5–5.2)
Albumin: 2.2 g/dL — ABNORMAL LOW (ref 3.5–5.2)
Alkaline Phosphatase: 55 U/L (ref 39–117)
Alkaline Phosphatase: 52 U/L (ref 39–117)
Anion gap: 17 — ABNORMAL HIGH (ref 5–15)
Anion gap: 11 (ref 5–15)
BUN: 10 mg/dL (ref 6–23)
BUN: 8 mg/dL (ref 6–23)
CO2: 20 mEq/L (ref 19–32)
CO2: 24 mEq/L (ref 19–32)
Calcium: 8.9 mg/dL (ref 8.4–10.5)
Calcium: 8.3 mg/dL — ABNORMAL LOW (ref 8.4–10.5)
Chloride: 98 mEq/L (ref 96–112)
Chloride: 98 mEq/L (ref 96–112)
Creatinine, Ser: 0.86 mg/dL (ref 0.50–1.35)
Creatinine, Ser: 0.96 mg/dL (ref 0.50–1.35)
GFR calc Af Amer: >90 mL/min (ref >90)
GFR calc Af Amer: >90 mL/min (ref >90)
GFR calc non Af Amer: >90 mL/min (ref >90)
GFR calc non Af Amer: >90 mL/min (ref >90)
Glucose, Bld: 80 mg/dL (ref 70–99)
Glucose, Bld: 116 mg/dL — ABNORMAL HIGH (ref 70–99)
Potassium: 4.8 mEq/L (ref 3.7–5.3)
Potassium: 4 mEq/L (ref 3.7–5.3)
Sodium: 135 mEq/L — ABNORMAL LOW (ref 137–147)
Sodium: 133 mEq/L — ABNORMAL LOW (ref 137–147)
Total Bilirubin: 0.5 mg/dL (ref 0.3–1.2)
Total Bilirubin: 0.6 mg/dL (ref 0.3–1.2)
Total Protein: 7.8 g/dL (ref 6.0–8.3)
Total Protein: 7 g/dL (ref 6.0–8.3)

## 2013-11-07 LAB — URINE MICROSCOPIC-ADD ON

## 2013-11-07 LAB — I-STAT CG4 LACTIC ACID, ED: Lactic Acid, Venous: 2.23 mmol/L — ABNORMAL HIGH (ref 0.5–2.2)

## 2013-11-07 LAB — I-STAT ARTERIAL BLOOD GAS, ED
Acid-Base Excess: 4 mmol/L — ABNORMAL HIGH (ref 0.0–2.0)
Bicarbonate: 25.6 mEq/L — ABNORMAL HIGH (ref 20.0–24.0)
O2 Saturation: 93 %
Patient temperature: 98.6
TCO2: 26 mmol/L (ref 0–100)
pCO2 arterial: 28.6 mmHg — ABNORMAL LOW (ref 35.0–45.0)
pH, Arterial: 7.56 — ABNORMAL HIGH (ref 7.350–7.450)
pO2, Arterial: 56 mmHg — ABNORMAL LOW (ref 80.0–100.0)

## 2013-11-07 LAB — PROTIME-INR
INR: 1.07 (ref 0.00–1.49)
Prothrombin Time: 13.9 seconds (ref 11.6–15.2)

## 2013-11-07 LAB — TROPONIN I: Troponin I: <0.3 ng/mL (ref <0.30)

## 2013-11-07 LAB — LIPASE, BLOOD: Lipase: 35 U/L (ref 11–59)

## 2013-11-07 LAB — PRO B NATRIURETIC PEPTIDE: Pro B Natriuretic peptide (BNP): 33.5 pg/mL (ref 0–125)

## 2013-11-07 LAB — SALICYLATE LEVEL: Salicylate Lvl: <2 mg/dL — ABNORMAL LOW (ref 2.8–20.0)

## 2013-11-07 MED ORDER — ONDANSETRON HCL 4 MG/2ML IJ SOLN
4.0000 mg | Freq: Three times a day (TID) | INTRAMUSCULAR | Status: DC | PRN
  Administered 2013-11-10: 4 mg via INTRAVENOUS
  Filled 2013-11-07 (×2): qty 2

## 2013-11-07 MED ORDER — LEVOFLOXACIN IN D5W 750 MG/150ML IV SOLN
750.0000 mg | Freq: Once | INTRAVENOUS | Status: AC
Start: 2013-11-07 — End: 2013-11-07
  Administered 2013-11-07: 750 mg via INTRAVENOUS
  Filled 2013-11-07: qty 150

## 2013-11-07 MED ORDER — PANTOPRAZOLE SODIUM 40 MG PO TBEC
40.0000 mg | DELAYED_RELEASE_TABLET | Freq: Every day | ORAL | Status: DC
  Administered 2013-11-07 – 2013-11-25 (×19): 40 mg via ORAL
  Filled 2013-11-07 (×16): qty 1

## 2013-11-07 MED ORDER — HEPARIN SODIUM (PORCINE) 5000 UNIT/ML IJ SOLN
5000.0000 [IU] | Freq: Three times a day (TID) | INTRAMUSCULAR | Status: DC
  Administered 2013-11-07 – 2013-11-09 (×4): 5000 [IU] via SUBCUTANEOUS
  Filled 2013-11-07 (×16): qty 1

## 2013-11-07 MED ORDER — ALBUTEROL SULFATE (2.5 MG/3ML) 0.083% IN NEBU
3.0000 mL | INHALATION_SOLUTION | Freq: Four times a day (QID) | RESPIRATORY_TRACT | Status: DC | PRN
  Administered 2013-11-07 – 2013-11-22 (×10): 3 mL via RESPIRATORY_TRACT
  Filled 2013-11-07 (×10): qty 3

## 2013-11-07 MED ORDER — SODIUM CHLORIDE 0.9 % IV BOLUS (SEPSIS)
1000.0000 mL | Freq: Once | INTRAVENOUS | Status: AC
  Administered 2013-11-07: 1000 mL via INTRAVENOUS

## 2013-11-07 MED ORDER — ACETAMINOPHEN 325 MG PO TABS
650.0000 mg | ORAL_TABLET | Freq: Four times a day (QID) | ORAL | Status: DC | PRN
  Administered 2013-11-07 – 2013-11-24 (×25): 650 mg via ORAL
  Filled 2013-11-07 (×26): qty 2

## 2013-11-07 MED ORDER — MORPHINE SULFATE 4 MG/ML IJ SOLN
4.0000 mg | Freq: Once | INTRAMUSCULAR | Status: AC
  Administered 2013-11-07: 4 mg via INTRAVENOUS
  Filled 2013-11-07: qty 1

## 2013-11-07 MED ORDER — SODIUM CHLORIDE 0.9 % IV SOLN: INTRAVENOUS | Status: DC

## 2013-11-07 MED ORDER — VANCOMYCIN HCL IN DEXTROSE 1-5 GM/200ML-% IV SOLN
1000.0000 mg | Freq: Once | INTRAVENOUS | Status: DC
  Filled 2013-11-07: qty 200

## 2013-11-07 MED ORDER — IOHEXOL 350 MG/ML SOLN
100.0000 mL | Freq: Once | INTRAVENOUS | Status: AC | PRN
  Administered 2013-11-07: 100 mL via INTRAVENOUS

## 2013-11-07 MED ORDER — SODIUM CHLORIDE 0.9 % IV SOLN
INTRAVENOUS | Status: AC
  Administered 2013-11-07: 07:00:00 via INTRAVENOUS

## 2013-11-07 MED ORDER — LEVOFLOXACIN IN D5W 750 MG/150ML IV SOLN
750.0000 mg | INTRAVENOUS | Status: DC
  Administered 2013-11-07 – 2013-11-10 (×4): 750 mg via INTRAVENOUS
  Filled 2013-11-07 (×4): qty 150

## 2013-11-07 MED ORDER — ONDANSETRON HCL 4 MG/2ML IJ SOLN
4.0000 mg | Freq: Once | INTRAMUSCULAR | Status: AC
  Administered 2013-11-07: 4 mg via INTRAVENOUS
  Filled 2013-11-07: qty 2

## 2013-11-07 MED ORDER — VANCOMYCIN HCL IN DEXTROSE 1-5 GM/200ML-% IV SOLN
1000.0000 mg | Freq: Once | INTRAVENOUS | Status: AC
Start: 2013-11-07 — End: 2013-11-07
  Administered 2013-11-07: 1000 mg via INTRAVENOUS
  Filled 2013-11-07: qty 200

## 2013-11-07 MED ORDER — VANCOMYCIN HCL IN DEXTROSE 1-5 GM/200ML-% IV SOLN
1000.0000 mg | Freq: Three times a day (TID) | INTRAVENOUS | Status: DC
  Administered 2013-11-07 – 2013-11-09 (×7): 1000 mg via INTRAVENOUS
  Filled 2013-11-07 (×10): qty 200

## 2013-11-07 NOTE — H&P (Signed)
[facility] History and Physical  [name] [name] DOB: 06-11-1980 DOA: 11/07/2013  Referring physician: ED physician PCP: No PCP Per Patient  Specialists:   Chief Complaint: Chest pain, shortness of breath and abdominal pain to  HPI: Jim Wyatt is a 33 y.o. male with a past medical history of asthma, and a recently treated community-acquired pneumonia, who presents with a chest pain, shortness of breath and abdominal pain.  The patient was seen on 9/5 and diagnosed with community-acquired pneumonia. He was discharged home with doxycycline. He has been compliant with medication. His condition has improved initially. But 2 days ago, he started having chest pain again. It is located in the front chest. It is aggravated by coughing and deep breath. He also has a fever and chills. He has mild cough with very little sputum production. He took ibuprofen for chest pain, 12 pills in the past 2 days. During the past 2 days, he used albuterol inhaler very frequently, approximately once every hour.  In yesterday morning, patient started having abdominal pain. It is located at the epigastric area. It is constant, aching-like pain. Without radiation. He has nausea, but no vomiting. He also has watery diarrhea, without blood in stool. He had 4 bowel movement yesterday with watery stools. Because of worsening chest pain, abdominal pain shortness of breath, he comes to the emergency room.  He was found to have lipase 35 and negative urinalysis for UTI. CT abdomen and CTA showed left lower lobe pneumonia. No significant abnormalities in abdomen was demonstrated. ABG showed pH 7.56, PCO2 28.6, PaO2 56. Lactic acid is 2.23.  Review of Systems: As presented in the history of presenting illness, rest negative.  Where does patient live?  Lives with roommate in [place] Can patient participate in ADLs? Yes  Allergy:  Allergies  Allergen Reactions  . Augmentin [Amoxicillin-Pot  Clavulanate] Other (See Comments)    unknown  . Other     Patient has multiple food allergies    Past Medical History  Diagnosis Date  . Asthma   . Pneumonia     History reviewed. No pertinent past surgical history.  Social History:  reports that he has never smoked. He does not have any smokeless tobacco history on file. He reports that he does not drink alcohol or use illicit drugs.  Family History: Mother and sisters are healthy, father and no.  Prior to Admission medications   Medication Sig Start Date End Date Taking? Authorizing Provider  albuterol (PROVENTIL HFA;VENTOLIN HFA) 108 (90 BASE) MCG/ACT inhaler Inhale 2 puffs into the lungs every 6 (six) hours as needed for wheezing or shortness of breath.   Yes Historical Provider, MD  doxycycline (VIBRA-TABS) 100 MG tablet Take 100 mg by mouth 2 (two) times daily. For 7 days 11/02/13 11/09/13 Yes Historical Provider, MD  ibuprofen (ADVIL,MOTRIN) 200 MG tablet Take 600 mg by mouth 2 (two) times daily as needed.   Yes Historical Provider, MD    Physical Exam: Filed Vitals:   11/06/13 2355 11/07/13 0005 11/07/13 0021 11/07/13 0510  BP: 144/80 117/95  132/76  Pulse:   100 86  Temp: 100 F (37.8 C)   98.9 F (37.2 C)  TempSrc: Oral   Oral  Resp: 26 27 26 16   SpO2: 90%  98% 100%   General: Not in acute distress HEENT:       Eyes: PERRL, EOMI, no scleral icterus       ENT: No discharge from the ears and nose, no  pharynx injection, no tonsillar enlargement.        Neck: No JVD, no bruit, no mass felt. Cardiac: S1/S2, RRR, No murmurs, gallops or rubs Pulm: Good air movement bilaterally. No rales, wheezing, rhonchi or rubs. Abd: Soft, nondistended, tender over the epigastric area, no rebound pain, no organomegaly, BS present Ext: No edema. 2+DP/PT pulse bilaterally Musculoskeletal: No joint deformities, erythema, or stiffness, ROM full Skin: No rashes.  Neuro: Alert and oriented X3, cranial nerves II-XII grossly intact, muscle  strength 5/5 in all extremeties, sensation to light touch intact.  Psych: Patient is not psychotic, no suicidal or hemocidal ideation.  Labs on Admission:  Basic Metabolic Panel:  Recent Labs Lab 11/07/13 0023  NA 135*  K 4.8  CL 98  CO2 20  GLUCOSE 80  BUN 10  CREATININE 0.86  CALCIUM 8.9   Liver Function Tests:  Recent Labs Lab 11/07/13 0023  AST 68*  ALT 64*  ALKPHOS 55  BILITOT 0.5  PROT 7.8  ALBUMIN 2.5*    Recent Labs Lab 11/07/13 0023  LIPASE 35   No results found for this basename: AMMONIA,  in the last 168 hours CBC: No results found for this basename: WBC, NEUTROABS, HGB, HCT, MCV, PLT,  in the last 168 hours Cardiac Enzymes: No results found for this basename: CKTOTAL, CKMB, CKMBINDEX, TROPONINI,  in the last 168 hours  BNP (last 3 results) No results found for this basename: PROBNP,  in the last 8760 hours CBG: No results found for this basename: GLUCAP,  in the last 168 hours  Radiological Exams on Admission: Dg Chest 2 View  11/07/2013   CLINICAL DATA:  Chest pain, recent diagnosis of pneumonia.  EXAM: CHEST  2 VIEW  COMPARISON:  Chest radiograph November 02, 2013  FINDINGS: Cardiomediastinal silhouette is unremarkable and unchanged. Mild perihilar peribronchial cuffing with slightly increasing patchy airspace opacities left mid and lower lung zone. No pleural effusions. No pneumothorax. Soft tissue planes and included osseous structures nonsuspicious peer  IMPRESSION: Perihilar peribronchial cuffing and slightly worsening left lung patchy consolidation concerning for bronchopneumonia.   Electronically Signed   By: Elon Alas   On: 11/07/2013 01:42   Ct Angio Chest Pe W/cm &/or Wo Cm  11/07/2013   CLINICAL DATA:  Mid chest pain and shortness of breath with dry cough for several days. Diagnosed with pneumonia 5 days ago. Chest pain, hypoxia, tachycardia.  EXAM: CT ANGIOGRAPHY CHEST  CT ABDOMEN AND PELVIS WITH CONTRAST  TECHNIQUE: Multidetector  CT imaging of the chest was performed using the standard protocol during bolus administration of intravenous contrast. Multiplanar CT image reconstructions and MIPs were obtained to evaluate the vascular anatomy. Multidetector CT imaging of the abdomen and pelvis was performed using the standard protocol during bolus administration of intravenous contrast.  CONTRAST:  170mL OMNIPAQUE IOHEXOL 350 MG/ML SOLN  COMPARISON:  CT abdomen and pelvis 09/03/2013.  Chest 11/07/2013.  FINDINGS: CTA CHEST FINDINGS  Technically adequate study with moderately good opacification of the central and segmental pulmonary arteries. No focal filling defects are demonstrated. No evidence of significant pulmonary embolus.  Normal heart size. Normal caliber thoracic aorta. Great vessel origins are patent. Bilateral hilar and mediastinal lymph nodes are present without significant pathologic enlargement, likely to be reactive. Small esophageal hiatal hernia. Esophagus is decompressed.  Patchy airspace disease demonstrated in the left lung base consistent with left lower lobe pneumonia. Additional patchy infiltrative changes throughout the right lung as well. Airways appear patent. No pleural effusions. No pneumothorax.  The with  CT ABDOMEN and PELVIS FINDINGS  The liver, spleen, gallbladder, pancreas, adrenal glands, kidneys, abdominal aorta, inferior vena cava, and retroperitoneal lymph nodes are unremarkable. Stomach and small bowel are not abnormally distended. Gas and stool in the colon without distention. No free air or free fluid in the abdomen.  Pelvis: A 2 mm appendicolith is demonstrated. The appendix is not abnormally distended and no inflammatory changes are present. Unlikely to represent acute appendicitis. Bladder is mostly decompressed. Prostate gland is not enlarged. No free or loculated pelvic fluid collections. No pelvic mass or lymphadenopathy. No destructive bone lesions.  Review of the MIP images confirms the above  findings.  IMPRESSION: No evidence of significant pulmonary embolus. Patchy bilateral airspace disease in the lungs, concentrated in the left lower lung, consistent with pneumonia.  Appendicolith with without evidence of appendicitis. No acute changes otherwise demonstrated in the abdomen or pelvis.   Electronically Signed   By: Lucienne Capers M.D.   On: 11/07/2013 03:46   Ct Abdomen Pelvis W Contrast  11/07/2013   CLINICAL DATA:  Mid chest pain and shortness of breath with dry cough for several days. Diagnosed with pneumonia 5 days ago. Chest pain, hypoxia, tachycardia.  EXAM: CT ANGIOGRAPHY CHEST  CT ABDOMEN AND PELVIS WITH CONTRAST  TECHNIQUE: Multidetector CT imaging of the chest was performed using the standard protocol during bolus administration of intravenous contrast. Multiplanar CT image reconstructions and MIPs were obtained to evaluate the vascular anatomy. Multidetector CT imaging of the abdomen and pelvis was performed using the standard protocol during bolus administration of intravenous contrast.  CONTRAST:  144mL OMNIPAQUE IOHEXOL 350 MG/ML SOLN  COMPARISON:  CT abdomen and pelvis 09/03/2013.  Chest 11/07/2013.  FINDINGS: CTA CHEST FINDINGS  Technically adequate study with moderately good opacification of the central and segmental pulmonary arteries. No focal filling defects are demonstrated. No evidence of significant pulmonary embolus.  Normal heart size. Normal caliber thoracic aorta. Great vessel origins are patent. Bilateral hilar and mediastinal lymph nodes are present without significant pathologic enlargement, likely to be reactive. Small esophageal hiatal hernia. Esophagus is decompressed.  Patchy airspace disease demonstrated in the left lung base consistent with left lower lobe pneumonia. Additional patchy infiltrative changes throughout the right lung as well. Airways appear patent. No pleural effusions. No pneumothorax.  The with  CT ABDOMEN and PELVIS FINDINGS  The liver, spleen,  gallbladder, pancreas, adrenal glands, kidneys, abdominal aorta, inferior vena cava, and retroperitoneal lymph nodes are unremarkable. Stomach and small bowel are not abnormally distended. Gas and stool in the colon without distention. No free air or free fluid in the abdomen.  Pelvis: A 2 mm appendicolith is demonstrated. The appendix is not abnormally distended and no inflammatory changes are present. Unlikely to represent acute appendicitis. Bladder is mostly decompressed. Prostate gland is not enlarged. No free or loculated pelvic fluid collections. No pelvic mass or lymphadenopathy. No destructive bone lesions.  Review of the MIP images confirms the above findings.  IMPRESSION: No evidence of significant pulmonary embolus. Patchy bilateral airspace disease in the lungs, concentrated in the left lower lung, consistent with pneumonia.  Appendicolith with without evidence of appendicitis. No acute changes otherwise demonstrated in the abdomen or pelvis.   Electronically Signed   By: Lucienne Capers M.D.   On: 11/07/2013 03:46    Assessment/Plan Principal Problem:   Pneumonia Active Problems:   Abdominal pain   PNA (pneumonia)   1. Healthcare associated pneumonia:  CT chest and abdomen reveals left pulmonary  infiltrates. Patient seems to have a new pneumonia. He meets criteria for healthcare associated pneumonia. Patient is allergic to Augmentin, making him not a good candidate for beta lactamase. His respiratory alkalosis is likely due to overuse of albuterol inhaler.  -will admitte to Telemetry Bed -will get blood culture x 2 -will get urine legionella and S. pneumococcal antigen - will treat with vancomycin and Levaquin -will treat with albuterol inhaler -will give Zofran for Nausea -will get EKG and trop x 1  2.  abdominal pain and diarrhea: It is likely due to ibuprofen-induced gastritis, however it does not explain the diarrhea clearly. C diff colitis needs to be ruled out given his  recent antibiotics use.  -check C diff PCR -Protoxi 40 mg daily - IVF: NS 150cc/h - Zofran for nausea  3. Asthma: stable. No wheezing on lung auscultation -Albuterol inhaler when necessary   DVT ppx: SQ Heparin     Code Status: Full code Family Communication:  Yes, patient's girl friend  at bed side Disposition Plan: Admit to inpatient  Ivor Costa [facility] Pager [phone]  If 7PM-7AM, please contact night-coverage [email or web address] Password [email or web address] 11/07/2013, 5:13 AM

## 2013-11-07 NOTE — Progress Notes (Signed)
Pt placed on enteric precautions. Pt stated that he had 4 loose stools yesterday but has not had any since then.  Will obtain sample when pt has a bowel movement.  Jim Wyatt E

## 2013-11-07 NOTE — Progress Notes (Addendum)
Patient admitted after midnight by Dr. Blaine Hamper- please see H&P.   1. Healthcare associated pneumonia: CT chest and abdomen reveals left pulmonary infiltrates. Patient seems to have a new pneumonia. He meets criteria for healthcare associated pneumonia. Patient is allergic to Augmentin, making him not a good candidate for beta lactamase. His respiratory alkalosis is likely due to overuse of albuterol inhaler.  -will admitte to Telemetry Bed  -will get blood culture x 2  -will get urine legionella and S. pneumococcal antigen -HIV  - will treat with vancomycin and Levaquin  -will treat with albuterol inhaler  -will give Zofran for Nausea  -will get EKG and trop x 1  2. abdominal pain and diarrhea: It is likely due to ibuprofen-induced gastritis, however it does not explain the diarrhea clearly. C diff colitis needs to be ruled out given his recent antibiotics use.  -check C diff PCR  -Protoxi 40 mg daily  - IVF: NS 150cc/h  - Zofran for nausea  3. Asthma: stable. No wheezing on lung auscultation  -Albuterol inhaler when necessary 4. Hematuria -U/A -no pain so doubt stone- just had CT scan of abd  Eulogio Bear DO

## 2013-11-07 NOTE — ED Notes (Signed)
Pt. In xray 

## 2013-11-07 NOTE — ED Notes (Signed)
CG-4 result reported to Dr. Lita Mains

## 2013-11-07 NOTE — ED Provider Notes (Signed)
CSN: [id number]     Arrival date & time 11/06/13  2345 History   First MD Initiated Contact with Patient 11/07/13 0000     Chief Complaint  Patient presents with  . Chest Pain     (Consider location/radiation/quality/duration/timing/severity/associated sxs/prior Treatment) HPI Patient was seen on 9/5 and diagnosed with community-acquired pneumonia. Was discharged home with doxycycline. Patient states he's been compliant with medication. He's had worsening of his shortness of breath. Is worse with any exertion. He is at the point where he is unable to ambulate due to shortness of breath. He continues to have a mild cough. He's had subjective fevers and chills. He also is complaining of one day of upper abdominal pain. He believes that the pain is radiating from his chest. He's had no nausea or vomiting. Patient does admit to increased ibuprofen use. He has no lower extremity swelling or pain. Patient's lower chest pain is worse with deep breathing and coughing. Patient has an unknown allergy to Augmentin.     Past Medical History  Diagnosis Date  . Asthma   . Pneumonia    History reviewed. No pertinent past surgical history. No family history on file. History  Substance Use Topics  . Smoking status: Never Smoker   . Smokeless tobacco: Not on file  . Alcohol Use: No    Review of Systems  Constitutional: Positive for fever, chills and fatigue.  Respiratory: Positive for cough and shortness of breath. Negative for wheezing.   Cardiovascular: Positive for chest pain. Negative for palpitations and leg swelling.  Gastrointestinal: Positive for abdominal pain. Negative for nausea, vomiting, diarrhea and blood in stool.  Genitourinary: Negative for dysuria, frequency and hematuria.  Musculoskeletal: Negative for back pain, neck pain and neck stiffness.  Skin: Negative for rash and wound.  Neurological: Negative for dizziness, weakness, light-headedness, numbness and headaches.  All other  systems reviewed and are negative.     Allergies  Augmentin and Other  Home Medications   Prior to Admission medications   Medication Sig Start Date End Date Taking? Authorizing Provider  albuterol (PROVENTIL HFA;VENTOLIN HFA) 108 (90 BASE) MCG/ACT inhaler Inhale 2 puffs into the lungs every 6 (six) hours as needed for wheezing or shortness of breath.   Yes Historical Provider, MD  doxycycline (VIBRA-TABS) 100 MG tablet Take 100 mg by mouth 2 (two) times daily. For 7 days 11/02/13 11/09/13 Yes Historical Provider, MD  ibuprofen (ADVIL,MOTRIN) 200 MG tablet Take 600 mg by mouth 2 (two) times daily as needed.   Yes Historical Provider, MD   BP 117/95  Pulse 100  Temp(Src) 100 F (37.8 C) (Oral)  Resp 26  SpO2 98% Physical Exam  Nursing note and vitals reviewed. Constitutional: He is oriented to person, place, and time. He appears well-developed and well-nourished. He appears distressed.  Ill-appearing.   HENT:  Head: Normocephalic and atraumatic.  Mouth/Throat: Oropharynx is clear and moist. No oropharyngeal exudate.  Eyes: EOM are normal. Pupils are equal, round, and reactive to light.  Neck: Normal range of motion. Neck supple.  Cardiovascular: Normal rate and regular rhythm.   Pulmonary/Chest: Breath sounds normal. He is in respiratory distress. He has no wheezes. He has no rales. He exhibits no tenderness.  Tachypnea. Decreased breath sounds left base.  Abdominal: Soft. Bowel sounds are normal. He exhibits no distension and no mass. There is tenderness (abdominal tenderness in the epigastric and left upper quadrants.). There is no rebound and no guarding.  Musculoskeletal: Normal range of motion. He exhibits  no edema and no tenderness.  No calf swelling or tenderness.  Neurological: He is alert and oriented to person, place, and time.  Moves all extremities. 4/5 motor. Sensation is grossly intact.  Skin: Skin is warm and dry. No rash noted. No erythema.  Psychiatric: He has a  normal mood and affect. His behavior is normal.    ED Course  Procedures (including critical care time) Labs Review Labs Reviewed  URINALYSIS, ROUTINE W REFLEX MICROSCOPIC - Abnormal; Notable for the following:    APPearance CLOUDY (*)    Hgb urine dipstick SMALL (*)    Protein, ur 100 (*)    All other components within normal limits  COMPREHENSIVE METABOLIC PANEL - Abnormal; Notable for the following:    Sodium 135 (*)    Albumin 2.5 (*)    AST 68 (*)    ALT 64 (*)    Anion gap 17 (*)    All other components within normal limits  SALICYLATE LEVEL - Abnormal; Notable for the following:    Salicylate Lvl 123456 (*)    All other components within normal limits  I-STAT CG4 LACTIC ACID, ED - Abnormal; Notable for the following:    Lactic Acid, Venous 2.23 (*)    All other components within normal limits  I-STAT ARTERIAL BLOOD GAS, ED - Abnormal; Notable for the following:    pH, Arterial 7.560 (*)    pCO2 arterial 28.6 (*)    pO2, Arterial 56.0 (*)    Bicarbonate 25.6 (*)    Acid-Base Excess 4.0 (*)    All other components within normal limits  CULTURE, BLOOD (ROUTINE X 2)  CULTURE, BLOOD (ROUTINE X 2)  LIPASE, BLOOD  URINE MICROSCOPIC-ADD ON  CBC  PRO B NATRIURETIC PEPTIDE  TROPONIN I    Imaging Review Dg Chest 2 View  11/07/2013   CLINICAL DATA:  Chest pain, recent diagnosis of pneumonia.  EXAM: CHEST  2 VIEW  COMPARISON:  Chest radiograph November 02, 2013  FINDINGS: Cardiomediastinal silhouette is unremarkable and unchanged. Mild perihilar peribronchial cuffing with slightly increasing patchy airspace opacities left mid and lower lung zone. No pleural effusions. No pneumothorax. Soft tissue planes and included osseous structures nonsuspicious peer  IMPRESSION: Perihilar peribronchial cuffing and slightly worsening left lung patchy consolidation concerning for bronchopneumonia.   Electronically Signed   By: Elon Alas   On: 11/07/2013 01:42   Ct Angio Chest Pe W/cm  &/or Wo Cm  11/07/2013   CLINICAL DATA:  Mid chest pain and shortness of breath with dry cough for several days. Diagnosed with pneumonia 5 days ago. Chest pain, hypoxia, tachycardia.  EXAM: CT ANGIOGRAPHY CHEST  CT ABDOMEN AND PELVIS WITH CONTRAST  TECHNIQUE: Multidetector CT imaging of the chest was performed using the standard protocol during bolus administration of intravenous contrast. Multiplanar CT image reconstructions and MIPs were obtained to evaluate the vascular anatomy. Multidetector CT imaging of the abdomen and pelvis was performed using the standard protocol during bolus administration of intravenous contrast.  CONTRAST:  180mL OMNIPAQUE IOHEXOL 350 MG/ML SOLN  COMPARISON:  CT abdomen and pelvis 09/03/2013.  Chest 11/07/2013.  FINDINGS: CTA CHEST FINDINGS  Technically adequate study with moderately good opacification of the central and segmental pulmonary arteries. No focal filling defects are demonstrated. No evidence of significant pulmonary embolus.  Normal heart size. Normal caliber thoracic aorta. Great vessel origins are patent. Bilateral hilar and mediastinal lymph nodes are present without significant pathologic enlargement, likely to be reactive. Small esophageal hiatal hernia. Esophagus  is decompressed.  Patchy airspace disease demonstrated in the left lung base consistent with left lower lobe pneumonia. Additional patchy infiltrative changes throughout the right lung as well. Airways appear patent. No pleural effusions. No pneumothorax.  The with  CT ABDOMEN and PELVIS FINDINGS  The liver, spleen, gallbladder, pancreas, adrenal glands, kidneys, abdominal aorta, inferior vena cava, and retroperitoneal lymph nodes are unremarkable. Stomach and small bowel are not abnormally distended. Gas and stool in the colon without distention. No free air or free fluid in the abdomen.  Pelvis: A 2 mm appendicolith is demonstrated. The appendix is not abnormally distended and no inflammatory changes are  present. Unlikely to represent acute appendicitis. Bladder is mostly decompressed. Prostate gland is not enlarged. No free or loculated pelvic fluid collections. No pelvic mass or lymphadenopathy. No destructive bone lesions.  Review of the MIP images confirms the above findings.  IMPRESSION: No evidence of significant pulmonary embolus. Patchy bilateral airspace disease in the lungs, concentrated in the left lower lung, consistent with pneumonia.  Appendicolith with without evidence of appendicitis. No acute changes otherwise demonstrated in the abdomen or pelvis.   Electronically Signed   By: Lucienne Capers M.D.   On: 11/07/2013 03:46   Ct Abdomen Pelvis W Contrast  11/07/2013   CLINICAL DATA:  Mid chest pain and shortness of breath with dry cough for several days. Diagnosed with pneumonia 5 days ago. Chest pain, hypoxia, tachycardia.  EXAM: CT ANGIOGRAPHY CHEST  CT ABDOMEN AND PELVIS WITH CONTRAST  TECHNIQUE: Multidetector CT imaging of the chest was performed using the standard protocol during bolus administration of intravenous contrast. Multiplanar CT image reconstructions and MIPs were obtained to evaluate the vascular anatomy. Multidetector CT imaging of the abdomen and pelvis was performed using the standard protocol during bolus administration of intravenous contrast.  CONTRAST:  159mL OMNIPAQUE IOHEXOL 350 MG/ML SOLN  COMPARISON:  CT abdomen and pelvis 09/03/2013.  Chest 11/07/2013.  FINDINGS: CTA CHEST FINDINGS  Technically adequate study with moderately good opacification of the central and segmental pulmonary arteries. No focal filling defects are demonstrated. No evidence of significant pulmonary embolus.  Normal heart size. Normal caliber thoracic aorta. Great vessel origins are patent. Bilateral hilar and mediastinal lymph nodes are present without significant pathologic enlargement, likely to be reactive. Small esophageal hiatal hernia. Esophagus is decompressed.  Patchy airspace disease  demonstrated in the left lung base consistent with left lower lobe pneumonia. Additional patchy infiltrative changes throughout the right lung as well. Airways appear patent. No pleural effusions. No pneumothorax.  The with  CT ABDOMEN and PELVIS FINDINGS  The liver, spleen, gallbladder, pancreas, adrenal glands, kidneys, abdominal aorta, inferior vena cava, and retroperitoneal lymph nodes are unremarkable. Stomach and small bowel are not abnormally distended. Gas and stool in the colon without distention. No free air or free fluid in the abdomen.  Pelvis: A 2 mm appendicolith is demonstrated. The appendix is not abnormally distended and no inflammatory changes are present. Unlikely to represent acute appendicitis. Bladder is mostly decompressed. Prostate gland is not enlarged. No free or loculated pelvic fluid collections. No pelvic mass or lymphadenopathy. No destructive bone lesions.  Review of the MIP images confirms the above findings.  IMPRESSION: No evidence of significant pulmonary embolus. Patchy bilateral airspace disease in the lungs, concentrated in the left lower lung, consistent with pneumonia.  Appendicolith with without evidence of appendicitis. No acute changes otherwise demonstrated in the abdomen or pelvis.   Electronically Signed   By: Oren Beckmann.D.  On: 11/07/2013 03:46     EKG Interpretation None      MDM   Final diagnoses:  Community acquired pneumonia   Patient appears more comfortable after fluids and IV pain medication. CT chest and abdomen reveals left pulmonary infiltrates. Discussed with hospitalist and will admit to a tele bed.   Julianne Rice, MD 11/07/13 [phone]

## 2013-11-07 NOTE — Progress Notes (Signed)
Utilization review completed. Chrysta Fulcher, RN, BSN. 

## 2013-11-07 NOTE — Progress Notes (Signed)
ANTIBIOTIC CONSULT NOTE - INITIAL  Pharmacy Consult for vancomycin Indication: rule out pneumonia  Allergies  Allergen Reactions  . Augmentin [Amoxicillin-Pot Clavulanate] Other (See Comments)    unknown  . Other     Patient has multiple food allergies    Patient Measurements: Height: 5\' 7"  (170.2 cm) Weight: 208 lb 3.2 oz (94.439 kg) IBW/kg (Calculated) : 66.1  Vital Signs: Temp: 99.4 F (37.4 C) (09/11 0530) Temp src: Oral (09/11 0530) BP: 128/82 mmHg (09/11 0530) Pulse Rate: 87 (09/11 0530)  Labs:  Recent Labs  11/07/13 0023  CREATININE 0.86   Estimated Creatinine Clearance: 137.5 ml/min (by C-G formula based on Cr of 0.86).   Microbiology: No results found for this or any previous visit (from the past 720 hour(s)).  Medical History: Past Medical History  Diagnosis Date  . Asthma   . Pneumonia     Medications:  Prescriptions prior to admission  Medication Sig Dispense Refill  . albuterol (PROVENTIL HFA;VENTOLIN HFA) 108 (90 BASE) MCG/ACT inhaler Inhale 2 puffs into the lungs every 6 (six) hours as needed for wheezing or shortness of breath.      . doxycycline (VIBRA-TABS) 100 MG tablet Take 100 mg by mouth 2 (two) times daily. For 7 days      . ibuprofen (ADVIL,MOTRIN) 200 MG tablet Take 600 mg by mouth 2 (two) times daily as needed.       Scheduled:  . sodium chloride   Intravenous STAT  . heparin  5,000 Units Subcutaneous 3 times per day  . levofloxacin (LEVAQUIN) IV  750 mg Intravenous Q24H  . pantoprazole  40 mg Oral Q1200  . vancomycin  1,000 mg Intravenous Once  . vancomycin  1,000 mg Intravenous Q8H    Assessment: 33yo male c/o CO w/ dry cough for several days, was dx'd w/ PNA 5d ago, has been taking doxycycline, reports compliance, no evidence of PE on CT but c/w PNA, to begin IV ABX.  Goal of Therapy:  Vancomycin trough level 15-20 mcg/ml  Plan:  Will start vancomycin 1000mg  IV Q8H and monitor CBC, Cx, levels prn.  Wynona Neat, PharmD,  BCPS  11/07/2013,5:40 AM

## 2013-11-08 DIAGNOSIS — R319 Hematuria, unspecified: Secondary | ICD-10-CM

## 2013-11-08 DIAGNOSIS — J45909 Unspecified asthma, uncomplicated: Secondary | ICD-10-CM

## 2013-11-08 LAB — BASIC METABOLIC PANEL
Anion gap: 12 (ref 5–15)
BUN: 11 mg/dL (ref 6–23)
CO2: 21 mEq/L (ref 19–32)
Calcium: 8.2 mg/dL — ABNORMAL LOW (ref 8.4–10.5)
Chloride: 97 mEq/L (ref 96–112)
Creatinine, Ser: 0.98 mg/dL (ref 0.50–1.35)
GFR calc Af Amer: >90 mL/min (ref >90)
GFR calc non Af Amer: >90 mL/min (ref >90)
Glucose, Bld: 136 mg/dL — ABNORMAL HIGH (ref 70–99)
Potassium: 3.8 mEq/L (ref 3.7–5.3)
Sodium: 130 mEq/L — ABNORMAL LOW (ref 137–147)

## 2013-11-08 LAB — CBC
HCT: 36.5 % — ABNORMAL LOW (ref 39.0–52.0)
Hemoglobin: 12.5 g/dL — ABNORMAL LOW (ref 13.0–17.0)
MCH: 26.8 pg (ref 26.0–34.0)
MCHC: 34.2 g/dL (ref 30.0–36.0)
MCV: 78.3 fL (ref 78.0–100.0)
Platelets: 155 10*3/uL (ref 150–400)
RBC: 4.66 MIL/uL (ref 4.22–5.81)
RDW: 13.8 % (ref 11.5–15.5)
WBC: 8.5 10*3/uL (ref 4.0–10.5)

## 2013-11-08 LAB — GC/CHLAMYDIA PROBE AMP
CT Probe RNA: NEGATIVE
GC Probe RNA: NEGATIVE

## 2013-11-08 LAB — CLOSTRIDIUM DIFFICILE BY PCR: Toxigenic C. Difficile by PCR: NEGATIVE

## 2013-11-08 MED ORDER — IBUPROFEN 600 MG PO TABS
600.0000 mg | ORAL_TABLET | Freq: Four times a day (QID) | ORAL | Status: DC | PRN
  Administered 2013-11-08 – 2013-11-09 (×2): 600 mg via ORAL
  Filled 2013-11-08 (×3): qty 1

## 2013-11-08 MED ORDER — IBUPROFEN 400 MG PO TABS
400.0000 mg | ORAL_TABLET | Freq: Once | ORAL | Status: AC
  Administered 2013-11-08: 400 mg via ORAL
  Filled 2013-11-08: qty 1

## 2013-11-08 NOTE — Progress Notes (Signed)
11/08/2013 12:52 AM Progress Notes The patient had a temperature of 100.1 with chills and was given 650 mg of Tylenol at 2330.  An hour later his temp went up to 102.8. He received scheduled Vancomycin. The physician was informed and he ordered a one time dose of ibuprofen and to give ice to the patient. Will continue to monitor the patient.  Lupita Dawn

## 2013-11-08 NOTE — Progress Notes (Signed)
PROGRESS NOTE  Jim Wyatt [name] DOB: [birth year] DOA: 11/07/2013 PCP: No PCP Per Patient  Assessment/Plan: Healthcare associated pneumonia:  -CT chest and abdomen reveals left pulmonary infiltrates.  -blood culture x 2  -urine legionella  -S. pneumococcal antigen negative  -HIV  -vancomycin and Levaquin   abdominal pain and diarrhea: It is likely due to ibuprofen-induced gastritis, however it does not explain the diarrhea clearly. C diff colitis needs to be ruled out given his recent antibiotics use.  -C diff PCR  -Protonix 40 mg daily  - Zofran for nausea  CT scan done  Asthma: stable. No wheezing on lung auscultation  -Albuterol inhaler when necessary   Hematuria  -resolved -?UTI- culture pending -no pain so doubt stone- just had CT scan of abd   Code Status: full Family Communication: patient Disposition Plan:    Consultants:       HPI/Subjective: Feeling some better this AM Less SOB -fever last PM  Objective: Filed Vitals:   11/08/13 0700  BP: 123/64  Pulse: 90  Temp: 98.6 F (37 C)  Resp: 19    Intake/Output Summary (Last 24 hours) at 11/08/13 0848 Last data filed at 11/07/13 1215  Gross per 24 hour  Intake    240 ml  Output      0 ml  Net    240 ml   Filed Weights   11/07/13 0530  Weight: 94.439 kg (208 lb 3.2 oz)    Exam:   General:  A+Ox3, NAD  Cardiovascular: rrr  Respiratory: clear, no wheezing  Abdomen: +BS, soft  Musculoskeletal: no edema   Data Reviewed: Basic Metabolic Panel:  Recent Labs Lab 11/07/13 0023 11/07/13 0855 11/08/13 0135  NA 135* 133* 130*  K 4.8 4.0 3.8  CL 98 98 97  CO2 20 24 21   GLUCOSE 80 116* 136*  BUN 10 8 11   CREATININE 0.86 0.96 0.98  CALCIUM 8.9 8.3* 8.2*   Liver Function Tests:  Recent Labs Lab 11/07/13 0023 11/07/13 0855  AST 68* 49*  ALT 64* 51  ALKPHOS 55 52  BILITOT 0.5 0.6  PROT 7.8 7.0  ALBUMIN 2.5* 2.2*    Recent Labs Lab 11/07/13 0023  LIPASE 35    No results found for this basename: AMMONIA,  in the last 168 hours CBC:  Recent Labs Lab 11/07/13 0855 11/08/13 0135  WBC 7.9 8.5  NEUTROABS 5.8  --   HGB 12.2* 12.5*  HCT 35.9* 36.5*  MCV 80.5 78.3  PLT 152 155   Cardiac Enzymes:  Recent Labs Lab 11/07/13 0855  TROPONINI <0.30   BNP (last 3 results)  Recent Labs  11/07/13 0855  PROBNP 33.5   CBG: No results found for this basename: GLUCAP,  in the last 168 hours  No results found for this or any previous visit (from the past 240 hour(s)).   Studies: Dg Chest 2 View  11/07/2013   CLINICAL DATA:  Chest pain, recent diagnosis of pneumonia.  EXAM: CHEST  2 VIEW  COMPARISON:  Chest radiograph November 02, 2013  FINDINGS: Cardiomediastinal silhouette is unremarkable and unchanged. Mild perihilar peribronchial cuffing with slightly increasing patchy airspace opacities left mid and lower lung zone. No pleural effusions. No pneumothorax. Soft tissue planes and included osseous structures nonsuspicious peer  IMPRESSION: Perihilar peribronchial cuffing and slightly worsening left lung patchy consolidation concerning for bronchopneumonia.   Electronically Signed   By: Elon Alas   On: 11/07/2013 01:42   Ct Angio Chest Pe W/cm &/or Wo  Cm  11/07/2013   CLINICAL DATA:  Mid chest pain and shortness of breath with dry cough for several days. Diagnosed with pneumonia 5 days ago. Chest pain, hypoxia, tachycardia.  EXAM: CT ANGIOGRAPHY CHEST  CT ABDOMEN AND PELVIS WITH CONTRAST  TECHNIQUE: Multidetector CT imaging of the chest was performed using the standard protocol during bolus administration of intravenous contrast. Multiplanar CT image reconstructions and MIPs were obtained to evaluate the vascular anatomy. Multidetector CT imaging of the abdomen and pelvis was performed using the standard protocol during bolus administration of intravenous contrast.  CONTRAST:  146mL OMNIPAQUE IOHEXOL 350 MG/ML SOLN  COMPARISON:  CT abdomen and  pelvis 09/03/2013.  Chest 11/07/2013.  FINDINGS: CTA CHEST FINDINGS  Technically adequate study with moderately good opacification of the central and segmental pulmonary arteries. No focal filling defects are demonstrated. No evidence of significant pulmonary embolus.  Normal heart size. Normal caliber thoracic aorta. Great vessel origins are patent. Bilateral hilar and mediastinal lymph nodes are present without significant pathologic enlargement, likely to be reactive. Small esophageal hiatal hernia. Esophagus is decompressed.  Patchy airspace disease demonstrated in the left lung base consistent with left lower lobe pneumonia. Additional patchy infiltrative changes throughout the right lung as well. Airways appear patent. No pleural effusions. No pneumothorax.  The with  CT ABDOMEN and PELVIS FINDINGS  The liver, spleen, gallbladder, pancreas, adrenal glands, kidneys, abdominal aorta, inferior vena cava, and retroperitoneal lymph nodes are unremarkable. Stomach and small bowel are not abnormally distended. Gas and stool in the colon without distention. No free air or free fluid in the abdomen.  Pelvis: A 2 mm appendicolith is demonstrated. The appendix is not abnormally distended and no inflammatory changes are present. Unlikely to represent acute appendicitis. Bladder is mostly decompressed. Prostate gland is not enlarged. No free or loculated pelvic fluid collections. No pelvic mass or lymphadenopathy. No destructive bone lesions.  Review of the MIP images confirms the above findings.  IMPRESSION: No evidence of significant pulmonary embolus. Patchy bilateral airspace disease in the lungs, concentrated in the left lower lung, consistent with pneumonia.  Appendicolith with without evidence of appendicitis. No acute changes otherwise demonstrated in the abdomen or pelvis.   Electronically Signed   By: Lucienne Capers M.D.   On: 11/07/2013 03:46   Ct Abdomen Pelvis W Contrast  11/07/2013   CLINICAL DATA:  Mid  chest pain and shortness of breath with dry cough for several days. Diagnosed with pneumonia 5 days ago. Chest pain, hypoxia, tachycardia.  EXAM: CT ANGIOGRAPHY CHEST  CT ABDOMEN AND PELVIS WITH CONTRAST  TECHNIQUE: Multidetector CT imaging of the chest was performed using the standard protocol during bolus administration of intravenous contrast. Multiplanar CT image reconstructions and MIPs were obtained to evaluate the vascular anatomy. Multidetector CT imaging of the abdomen and pelvis was performed using the standard protocol during bolus administration of intravenous contrast.  CONTRAST:  17mL OMNIPAQUE IOHEXOL 350 MG/ML SOLN  COMPARISON:  CT abdomen and pelvis 09/03/2013.  Chest 11/07/2013.  FINDINGS: CTA CHEST FINDINGS  Technically adequate study with moderately good opacification of the central and segmental pulmonary arteries. No focal filling defects are demonstrated. No evidence of significant pulmonary embolus.  Normal heart size. Normal caliber thoracic aorta. Great vessel origins are patent. Bilateral hilar and mediastinal lymph nodes are present without significant pathologic enlargement, likely to be reactive. Small esophageal hiatal hernia. Esophagus is decompressed.  Patchy airspace disease demonstrated in the left lung base consistent with left lower lobe pneumonia. Additional patchy infiltrative  changes throughout the right lung as well. Airways appear patent. No pleural effusions. No pneumothorax.  The with  CT ABDOMEN and PELVIS FINDINGS  The liver, spleen, gallbladder, pancreas, adrenal glands, kidneys, abdominal aorta, inferior vena cava, and retroperitoneal lymph nodes are unremarkable. Stomach and small bowel are not abnormally distended. Gas and stool in the colon without distention. No free air or free fluid in the abdomen.  Pelvis: A 2 mm appendicolith is demonstrated. The appendix is not abnormally distended and no inflammatory changes are present. Unlikely to represent acute  appendicitis. Bladder is mostly decompressed. Prostate gland is not enlarged. No free or loculated pelvic fluid collections. No pelvic mass or lymphadenopathy. No destructive bone lesions.  Review of the MIP images confirms the above findings.  IMPRESSION: No evidence of significant pulmonary embolus. Patchy bilateral airspace disease in the lungs, concentrated in the left lower lung, consistent with pneumonia.  Appendicolith with without evidence of appendicitis. No acute changes otherwise demonstrated in the abdomen or pelvis.   Electronically Signed   By: Lucienne Capers M.D.   On: 11/07/2013 03:46    Scheduled Meds: . heparin  5,000 Units Subcutaneous 3 times per day  . levofloxacin (LEVAQUIN) IV  750 mg Intravenous Q24H  . pantoprazole  40 mg Oral Q1200  . vancomycin  1,000 mg Intravenous Q8H   Continuous Infusions:  Antibiotics Given (last 72 hours)   Date/Time Action Medication Dose Rate   11/07/13 0659 Given   levofloxacin (LEVAQUIN) IVPB 750 mg 750 mg 100 mL/hr   11/07/13 0834 Given   vancomycin (VANCOCIN) IVPB 1000 mg/200 mL premix 1,000 mg 200 mL/hr   11/07/13 1540 Given   vancomycin (VANCOCIN) IVPB 1000 mg/200 mL premix 1,000 mg 200 mL/hr   11/08/13 0029 Given   vancomycin (VANCOCIN) IVPB 1000 mg/200 mL premix 1,000 mg 200 mL/hr   11/08/13 0549 Given   levofloxacin (LEVAQUIN) IVPB 750 mg 750 mg 100 mL/hr   11/08/13 0734 Given   vancomycin (VANCOCIN) IVPB 1000 mg/200 mL premix 1,000 mg 200 mL/hr      Principal Problem:   Healthcare-associated pneumonia Active Problems:   Asthma   Abdominal pain    Time spent: 35 min    Latoya Maulding, [facility] Pager [phone]. If 7PM-7AM, please contact night-coverage at [email or web address], password [facility] 11/08/2013, 8:48 AM  LOS: 1 day

## 2013-11-09 ENCOUNTER — Encounter (HOSPITAL_COMMUNITY): Payer: Self-pay | Admitting: Internal Medicine

## 2013-11-09 DIAGNOSIS — R748 Abnormal levels of other serum enzymes: Secondary | ICD-10-CM | POA: Diagnosis present

## 2013-11-09 DIAGNOSIS — Z8619 Personal history of other infectious and parasitic diseases: Secondary | ICD-10-CM | POA: Diagnosis present

## 2013-11-09 DIAGNOSIS — Z21 Asymptomatic human immunodeficiency virus [HIV] infection status: Secondary | ICD-10-CM | POA: Diagnosis present

## 2013-11-09 DIAGNOSIS — E669 Obesity, unspecified: Secondary | ICD-10-CM | POA: Diagnosis present

## 2013-11-09 LAB — URINE CULTURE
Colony Count: NO GROWTH
Culture: NO GROWTH

## 2013-11-09 LAB — HIV 1/2 CONFIRMATION
HIV-1 antibody: POSITIVE — AB
HIV-2 Ab: NEGATIVE

## 2013-11-09 LAB — LEGIONELLA ANTIGEN, URINE: Legionella Antigen, Urine: NEGATIVE

## 2013-11-09 LAB — LACTATE DEHYDROGENASE: LDH: 541 U/L — ABNORMAL HIGH (ref 94–250)

## 2013-11-09 LAB — HIV ANTIBODY (ROUTINE TESTING W REFLEX): HIV 1&2 Ab, 4th Generation: REACTIVE — AB

## 2013-11-09 MED ORDER — DEXTROSE 5 % IV SOLN
340.0000 mg | Freq: Four times a day (QID) | INTRAVENOUS | Status: DC
  Administered 2013-11-09: 340 mg via INTRAVENOUS
  Filled 2013-11-09 (×4): qty 21.3

## 2013-11-09 MED ORDER — SULFAMETHOXAZOLE-TRIMETHOPRIM 400-80 MG/5ML IV SOLN
340.0000 mg | Freq: Four times a day (QID) | INTRAVENOUS | Status: DC
  Administered 2013-11-10: 340 mg via INTRAVENOUS
  Filled 2013-11-09 (×5): qty 21.3

## 2013-11-09 NOTE — Progress Notes (Signed)
Ambulated in hallway with patient. Patient walked approx. 800 feet. Denies any SOB or dizziness. O2 SATS 96-100%,  HR did jump increase to110-120 during the walk. Roxan Hockey, RN

## 2013-11-09 NOTE — Progress Notes (Signed)
Patient refused Heparin SQ. Patient questions if it was the cause of his "urine being blood tinged." Ysabelle Goodroe, Vilinda Boehringer, RN

## 2013-11-09 NOTE — Progress Notes (Signed)
PT's girlfriend asked to speak with RN. She asked the RN was the patient ok because he was making comments that "he is dying", and "this virus is taking over my body." Girlfriend informed nurse that she was diagnosed with HIV 2 years ago and informed the patient, who was in denial and refused to be tested. Asked nurse was the HIV test performed and nurse informed her that the patient would have to share that information with her.  Roxan Hockey, RN

## 2013-11-09 NOTE — Progress Notes (Signed)
PROGRESS NOTE  Jim DENNEN [name] DOB: 22-Oct-1980 DOA: 11/07/2013 PCP: No PCP Per Patient  Assessment/Plan: Healthcare associated pneumonia:  -CT chest and abdomen reveals left pulmonary infiltrates.  -blood culture x 2 NGTD -urine legionella  -S. pneumococcal antigen negative  -HIV  -vancomycin and Levaquin   abdominal pain and diarrhea: It is likely due to ibuprofen-induced gastritis Diarrhea resolved -C diff PCR neg -Protonix 40 mg daily  - Zofran for nausea  CT scan done  Asthma: stable. No wheezing on lung auscultation  -Albuterol inhaler when necessary   Hematuria  -resolved -?UTI- culture pending- NGTD -no pain so doubt stone- just had CT scan of abd   Code Status: full Family Communication: patient Disposition Plan:    Consultants:       HPI/Subjective: Still with occasional SOB Still getting fevers  Objective: Filed Vitals:   11/09/13 0720  BP:   Pulse:   Temp: 99.5 F (37.5 C)  Resp:     Intake/Output Summary (Last 24 hours) at 11/09/13 0920 Last data filed at 11/09/13 0532  Gross per 24 hour  Intake   1070 ml  Output      0 ml  Net   1070 ml   Filed Weights   11/07/13 0530  Weight: 94.439 kg (208 lb 3.2 oz)    Exam:   General:  A+Ox3, NAD  Cardiovascular: rrr  Respiratory: clear, no wheezing  Abdomen: +BS, soft  Musculoskeletal: no edema   Data Reviewed: Basic Metabolic Panel:  Recent Labs Lab 11/07/13 0023 11/07/13 0855 11/08/13 0135  NA 135* 133* 130*  K 4.8 4.0 3.8  CL 98 98 97  CO2 20 24 21   GLUCOSE 80 116* 136*  BUN 10 8 11   CREATININE 0.86 0.96 0.98  CALCIUM 8.9 8.3* 8.2*   Liver Function Tests:  Recent Labs Lab 11/07/13 0023 11/07/13 0855  AST 68* 49*  ALT 64* 51  ALKPHOS 55 52  BILITOT 0.5 0.6  PROT 7.8 7.0  ALBUMIN 2.5* 2.2*    Recent Labs Lab 11/07/13 0023  LIPASE 35   No results found for this basename: AMMONIA,  in the last 168 hours CBC:  Recent Labs Lab  11/07/13 0855 11/08/13 0135  WBC 7.9 8.5  NEUTROABS 5.8  --   HGB 12.2* 12.5*  HCT 35.9* 36.5*  MCV 80.5 78.3  PLT 152 155   Cardiac Enzymes:  Recent Labs Lab 11/07/13 0855  TROPONINI <0.30   BNP (last 3 results)  Recent Labs  11/07/13 0855  PROBNP 33.5   CBG: No results found for this basename: GLUCAP,  in the last 168 hours  Recent Results (from the past 240 hour(s))  CULTURE, BLOOD (ROUTINE X 2)     Status: None   Collection Time    11/07/13 12:23 AM      Result Value Ref Range Status   Specimen Description BLOOD RIGHT HAND   Final   Special Requests BOTTLES DRAWN AEROBIC AND ANAEROBIC 5CC   Final   Culture  Setup Time     Final   Value: 11/07/2013 04:48     Performed at [facility]   Culture     Final   Value:        BLOOD CULTURE RECEIVED NO GROWTH TO DATE CULTURE WILL BE HELD FOR 5 DAYS BEFORE ISSUING A FINAL NEGATIVE REPORT     Performed at [facility]   Report Status PENDING   Incomplete  CULTURE, BLOOD (ROUTINE X 2)  Status: None   Collection Time    11/07/13 12:28 AM      Result Value Ref Range Status   Specimen Description BLOOD LEFT ANTECUBITAL   Final   Special Requests BOTTLES DRAWN AEROBIC AND ANAEROBIC 10CC   Final   Culture  Setup Time     Final   Value: 11/07/2013 04:48     Performed at [facility]   Culture     Final   Value:        BLOOD CULTURE RECEIVED NO GROWTH TO DATE CULTURE WILL BE HELD FOR 5 DAYS BEFORE ISSUING A FINAL NEGATIVE REPORT     Performed at [facility]   Report Status PENDING   Incomplete  URINE CULTURE     Status: None   Collection Time    11/07/13  4:52 PM      Result Value Ref Range Status   Specimen Description URINE, RANDOM   Final   Special Requests NONE   Final   Culture  Setup Time     Final   Value: 11/07/2013 19:58     Performed at [place]     Final   Value: NO GROWTH     Performed at [facility]   Culture     Final   Value: NO  GROWTH     Performed at [facility]   Report Status 11/09/2013 FINAL   Final  GC/CHLAMYDIA PROBE AMP     Status: None   Collection Time    11/07/13  4:52 PM      Result Value Ref Range Status   CT Probe RNA NEGATIVE  NEGATIVE Final   GC Probe RNA NEGATIVE  NEGATIVE Final   Comment: (NOTE)                                                                                               **Normal Reference Range: Negative**          Assay performed using the Gen-Probe APTIMA COMBO2 (R) Assay.     Acceptable specimen types for this assay include APTIMA Swabs (Unisex,     endocervical, urethral, or vaginal), first void urine, and ThinPrep     liquid based cytology samples.     Performed at [facility] DIFFICILE BY PCR     Status: None   Collection Time    11/07/13 10:35 PM      Result Value Ref Range Status   C difficile by pcr NEGATIVE  NEGATIVE Final     Studies: No results found.  Scheduled Meds: . heparin  5,000 Units Subcutaneous 3 times per day  . levofloxacin (LEVAQUIN) IV  750 mg Intravenous Q24H  . pantoprazole  40 mg Oral Q1200  . vancomycin  1,000 mg Intravenous Q8H   Continuous Infusions:  Antibiotics Given (last 72 hours)   Date/Time Action Medication Dose Rate   11/07/13 0659 Given   levofloxacin (LEVAQUIN) IVPB 750 mg 750 mg 100 mL/hr   11/07/13 0834 Given   vancomycin (VANCOCIN) IVPB 1000 mg/200 mL premix  1,000 mg 200 mL/hr   11/07/13 1540 Given   vancomycin (VANCOCIN) IVPB 1000 mg/200 mL premix 1,000 mg 200 mL/hr   11/08/13 0029 Given   vancomycin (VANCOCIN) IVPB 1000 mg/200 mL premix 1,000 mg 200 mL/hr   11/08/13 0549 Given   levofloxacin (LEVAQUIN) IVPB 750 mg 750 mg 100 mL/hr   11/08/13 0734 Given   vancomycin (VANCOCIN) IVPB 1000 mg/200 mL premix 1,000 mg 200 mL/hr   11/08/13 1621 Given   vancomycin (VANCOCIN) IVPB 1000 mg/200 mL premix 1,000 mg 200 mL/hr   11/08/13 2308 Given   vancomycin (VANCOCIN) IVPB 1000 mg/200 mL  premix 1,000 mg 200 mL/hr   11/09/13 0532 Given   levofloxacin (LEVAQUIN) IVPB 750 mg 750 mg 100 mL/hr   11/09/13 0719 Given   vancomycin (VANCOCIN) IVPB 1000 mg/200 mL premix 1,000 mg 200 mL/hr      Principal Problem:   Healthcare-associated pneumonia Active Problems:   Asthma   Abdominal pain    Time spent: 35 min    Havard Radigan  [facility] Pager [phone]. If 7PM-7AM, please contact night-coverage at [email or web address], password [facility] 11/09/2013, 9:20 AM  LOS: 2 days

## 2013-11-09 NOTE — Progress Notes (Signed)
ANTIBIOTIC CONSULT NOTE - INITIAL  Pharmacy Consult for Bactrim Indication: r/o PCP PNA  Allergies  Allergen Reactions  . Augmentin [Amoxicillin-Pot Clavulanate] Other (See Comments)    unknown  . Banana   . Other     Patient has multiple food allergies    Patient Measurements: Height: 5\' 7"  (170.2 cm) Weight: 208 lb 3.2 oz (94.439 kg) IBW/kg (Calculated) : 66.1 Adjusted Body Weight: 77.4 kg  Vital Signs: Temp: 99.2 F (37.3 C) (09/13 1337) Temp src: Oral (09/13 1337) BP: 112/67 mmHg (09/13 1337) Pulse Rate: 116 (09/13 1337) Intake/Output from previous day: 09/12 0701 - 09/13 0700 In: 1310 [P.O.:960; IV Piggyback:350] Out: -  Intake/Output from this shift: Total I/O In: 360 [P.O.:360] Out: -   Labs:  Recent Labs  11/07/13 0023 11/07/13 0855 11/08/13 0135  WBC  --  7.9 8.5  HGB  --  12.2* 12.5*  PLT  --  152 155  CREATININE 0.86 0.96 0.98   Estimated Creatinine Clearance: 120.7 ml/min (by C-G formula based on Cr of 0.98). No results found for this basename: VANCOTROUGH, VANCOPEAK, VANCORANDOM, [place], GENTPEAK, GENTRANDOM, TOBRATROUGH, TOBRAPEAK, TOBRARND, AMIKACINPEAK, AMIKACINTROU, AMIKACIN,  in the last 72 hours   Microbiology: Recent Results (from the past 720 hour(s))  CULTURE, BLOOD (ROUTINE X 2)     Status: None   Collection Time    11/07/13 12:23 AM      Result Value Ref Range Status   Specimen Description BLOOD RIGHT HAND   Final   Special Requests BOTTLES DRAWN AEROBIC AND ANAEROBIC 5CC   Final   Culture  Setup Time     Final   Value: 11/07/2013 04:48     Performed at [facility]   Culture     Final   Value:        BLOOD CULTURE RECEIVED NO GROWTH TO DATE CULTURE WILL BE HELD FOR 5 DAYS BEFORE ISSUING A FINAL NEGATIVE REPORT     Performed at [facility]   Report Status PENDING   Incomplete  CULTURE, BLOOD (ROUTINE X 2)     Status: None   Collection Time    11/07/13 12:28 AM      Result Value Ref Range Status   Specimen Description BLOOD LEFT ANTECUBITAL   Final   Special Requests BOTTLES DRAWN AEROBIC AND ANAEROBIC 10CC   Final   Culture  Setup Time     Final   Value: 11/07/2013 04:48     Performed at [facility]   Culture     Final   Value:        BLOOD CULTURE RECEIVED NO GROWTH TO DATE CULTURE WILL BE HELD FOR 5 DAYS BEFORE ISSUING A FINAL NEGATIVE REPORT     Performed at [facility]   Report Status PENDING   Incomplete  URINE CULTURE     Status: None   Collection Time    11/07/13  4:52 PM      Result Value Ref Range Status   Specimen Description URINE, RANDOM   Final   Special Requests NONE   Final   Culture  Setup Time     Final   Value: 11/07/2013 19:58     Performed at [place]     Final   Value: NO GROWTH     Performed at [facility]   Culture     Final   Value: NO GROWTH     Performed at [facility]   Report Status 11/09/2013 FINAL   Final  GC/CHLAMYDIA PROBE AMP     Status: None   Collection Time    11/07/13  4:52 PM      Result Value Ref Range Status   CT Probe RNA NEGATIVE  NEGATIVE Final   GC Probe RNA NEGATIVE  NEGATIVE Final   Comment: (NOTE)                                                                                               **Normal Reference Range: Negative**          Assay performed using the Gen-Probe APTIMA COMBO2 (R) Assay.     Acceptable specimen types for this assay include APTIMA Swabs (Unisex,     endocervical, urethral, or vaginal), first void urine, and ThinPrep     liquid based cytology samples.     Performed at [facility] DIFFICILE BY PCR     Status: None   Collection Time    11/07/13 10:35 PM      Result Value Ref Range Status   C difficile by pcr NEGATIVE  NEGATIVE Final    Medical History: Past Medical History  Diagnosis Date  . Asthma   . Pneumonia   . Shingles     Assessment: 33 YOM who was admitted on 9/11 for worsening PNA despite  outpatient treatment with doxycycline. The patient has been noted to have persistent fevers and lacking improvement in infection. HIV antibody test has resulted as positive - waiting on confirmatory testing. ID was consulted and have discontinued Vancomycin and consulted pharmacy to dose Bactrim for r/o PCP PNA. SCr 0.98, CrCl~100 ml/min.   Given the patient's BMI>30 - will utilize an adjusted body weight for dosing.  Goal of Therapy:  Proper antibiotics for infection/cultures adjusted for renal/hepatic function   Plan:  1. Start Bactrim 340 mg TMP every 6 hours (~17.5 mg/kg/day of TMP) 2. Continue Levaquin 750 mg IV every 24 hours 3. Will continue to follow renal function, culture results, LOT, and antibiotic de-escalation plans   Alycia Rossetti, PharmD, BCPS Clinical Pharmacist Pager: [phone] 11/09/2013 4:35 PM
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
## 2013-11-10 DIAGNOSIS — Z21 Asymptomatic human immunodeficiency virus [HIV] infection status: Secondary | ICD-10-CM

## 2013-11-10 LAB — RESPIRATORY VIRUS PANEL
Adenovirus: NOT DETECTED
Influenza A H1: NOT DETECTED
Influenza A H3: NOT DETECTED
Influenza A: NOT DETECTED
Influenza B: NOT DETECTED
Metapneumovirus: NOT DETECTED
Parainfluenza 1: NOT DETECTED
Parainfluenza 2: NOT DETECTED
Parainfluenza 3: NOT DETECTED
Respiratory Syncytial Virus A: NOT DETECTED
Respiratory Syncytial Virus B: NOT DETECTED
Rhinovirus: NOT DETECTED

## 2013-11-10 LAB — RPR

## 2013-11-10 LAB — T-HELPER CELLS (CD4) COUNT (NOT AT ARMC)
CD4 % Helper T Cell: 3 % — ABNORMAL LOW (ref 33–55)
CD4 T Cell Abs: 40 /uL — ABNORMAL LOW (ref 400–2700)

## 2013-11-10 LAB — HEPATITIS PANEL, ACUTE
HCV Ab: NEGATIVE
Hep A IgM: NONREACTIVE
Hep B C IgM: NONREACTIVE
Hepatitis B Surface Ag: NEGATIVE

## 2013-11-10 MED ORDER — NYSTATIN 100000 UNIT/ML MT SUSP
5.0000 mL | Freq: Four times a day (QID) | OROMUCOSAL | Status: DC
  Administered 2013-11-10 – 2013-11-25 (×49): 500000 [IU] via ORAL
  Filled 2013-11-10 (×62): qty 5

## 2013-11-10 MED ORDER — LEVOFLOXACIN 750 MG PO TABS
750.0000 mg | ORAL_TABLET | ORAL | Status: DC
  Administered 2013-11-11: 750 mg via ORAL
  Filled 2013-11-10 (×2): qty 1

## 2013-11-10 MED ORDER — PROMETHAZINE HCL 25 MG/ML IJ SOLN: 12.5000 mg | Freq: Four times a day (QID) | INTRAMUSCULAR | Status: DC | PRN

## 2013-11-10 MED ORDER — IBUPROFEN 600 MG PO TABS
600.0000 mg | ORAL_TABLET | Freq: Four times a day (QID) | ORAL | Status: DC | PRN
  Administered 2013-11-10: 600 mg via ORAL
  Filled 2013-11-10 (×2): qty 1

## 2013-11-10 MED ORDER — ONDANSETRON HCL 4 MG/2ML IJ SOLN
4.0000 mg | INTRAMUSCULAR | Status: DC | PRN
  Administered 2013-11-10 – 2013-11-15 (×9): 4 mg via INTRAVENOUS
  Filled 2013-11-10 (×10): qty 2

## 2013-11-10 MED ORDER — SULFAMETHOXAZOLE-TMP DS 800-160 MG PO TABS
2.0000 | ORAL_TABLET | Freq: Four times a day (QID) | ORAL | Status: DC
  Administered 2013-11-10 – 2013-11-11 (×4): 2 via ORAL
  Filled 2013-11-10 (×7): qty 2

## 2013-11-10 NOTE — Progress Notes (Addendum)
PROGRESS NOTE  Jim Wyatt [id number] DOB: 11-Jan-1981 DOA: 11/07/2013 PCP: No PCP Per Patient  Assessment/Plan: Healthcare associated pneumonia:  -CT chest and abdomen reveals left pulmonary infiltrates.  -blood culture x 2 NGTD -urine legionella  -S. pneumococcal antigen negative  -HIV positive -Levaquin  - bactrim added for PCP coverage -change both to PO  HIV positive -Appreciate Dr. Megan Salon  abdominal pain and diarrhea: It is likely due to ibuprofen-induced gastritis Diarrhea resolved -C diff PCR neg -Protonix 40 mg daily  - Zofran for nausea  CT scan done  Asthma: stable. No wheezing on lung auscultation  -Albuterol inhaler when necessary   Hematuria  -resolved -?UTI- culture pending- NGTD -no pain so doubt stone- just had CT scan of abd  Refusing heparin -SCDS and ambualtion  Code Status: full Family Communication: patient Disposition Plan: home in AM?   Consultants:       HPI/Subjective: Still trying to process yesterday's information   Objective: Filed Vitals:   11/10/13 0513  BP: 104/62  Pulse: 98  Temp: 99.8 F (37.7 C)  Resp: 18    Intake/Output Summary (Last 24 hours) at 11/10/13 1001 Last data filed at 11/10/13 0600  Gross per 24 hour  Intake   1630 ml  Output      0 ml  Net   1630 ml   Filed Weights   11/07/13 0530  Weight: 94.439 kg (208 lb 3.2 oz)    Exam:   General:  A+Ox3, NAD  Cardiovascular: rrr  Respiratory: clear, no wheezing  Abdomen: +BS, soft  Musculoskeletal: no edema   Data Reviewed: Basic Metabolic Panel:  Recent Labs Lab 11/07/13 0023 11/07/13 0855 11/08/13 0135  NA 135* 133* 130*  K 4.8 4.0 3.8  CL 98 98 97  CO2 20 24 21   GLUCOSE 80 116* 136*  BUN 10 8 11   CREATININE 0.86 0.96 0.98  CALCIUM 8.9 8.3* 8.2*   Liver Function Tests:  Recent Labs Lab 11/07/13 0023 11/07/13 0855  AST 68* 49*  ALT 64* 51  ALKPHOS 55 52  BILITOT 0.5 0.6  PROT 7.8 7.0  ALBUMIN 2.5* 2.2*     Recent Labs Lab 11/07/13 0023  LIPASE 35   No results found for this basename: AMMONIA,  in the last 168 hours CBC:  Recent Labs Lab 11/07/13 0855 11/08/13 0135  WBC 7.9 8.5  NEUTROABS 5.8  --   HGB 12.2* 12.5*  HCT 35.9* 36.5*  MCV 80.5 78.3  PLT 152 155   Cardiac Enzymes:  Recent Labs Lab 11/07/13 0855  TROPONINI <0.30   BNP (last 3 results)  Recent Labs  11/07/13 0855  PROBNP 33.5   CBG: No results found for this basename: GLUCAP,  in the last 168 hours  Recent Results (from the past 240 hour(s))  CULTURE, BLOOD (ROUTINE X 2)     Status: None   Collection Time    11/07/13 12:23 AM      Result Value Ref Range Status   Specimen Description BLOOD RIGHT HAND   Final   Special Requests BOTTLES DRAWN AEROBIC AND ANAEROBIC 5CC   Final   Culture  Setup Time     Final   Value: 11/07/2013 04:48     Performed at [facility]   Culture     Final   Value:        BLOOD CULTURE RECEIVED NO GROWTH TO DATE CULTURE WILL BE HELD FOR 5 DAYS BEFORE ISSUING A FINAL NEGATIVE REPORT  Performed at [facility]   Report Status PENDING   Incomplete  CULTURE, BLOOD (ROUTINE X 2)     Status: None   Collection Time    11/07/13 12:28 AM      Result Value Ref Range Status   Specimen Description BLOOD LEFT ANTECUBITAL   Final   Special Requests BOTTLES DRAWN AEROBIC AND ANAEROBIC 10CC   Final   Culture  Setup Time     Final   Value: 11/07/2013 04:48     Performed at [facility]   Culture     Final   Value:        BLOOD CULTURE RECEIVED NO GROWTH TO DATE CULTURE WILL BE HELD FOR 5 DAYS BEFORE ISSUING A FINAL NEGATIVE REPORT     Performed at [facility]   Report Status PENDING   Incomplete  URINE CULTURE     Status: None   Collection Time    11/07/13  4:52 PM      Result Value Ref Range Status   Specimen Description URINE, RANDOM   Final   Special Requests NONE   Final   Culture  Setup Time     Final   Value: 11/07/2013 19:58      Performed at [place]     Final   Value: NO GROWTH     Performed at [facility]   Culture     Final   Value: NO GROWTH     Performed at [facility]   Report Status 11/09/2013 FINAL   Final  GC/CHLAMYDIA PROBE AMP     Status: None   Collection Time    11/07/13  4:52 PM      Result Value Ref Range Status   CT Probe RNA NEGATIVE  NEGATIVE Final   GC Probe RNA NEGATIVE  NEGATIVE Final   Comment: (NOTE)                                                                                               **Normal Reference Range: Negative**          Assay performed using the Gen-Probe APTIMA COMBO2 (R) Assay.     Acceptable specimen types for this assay include APTIMA Swabs (Unisex,     endocervical, urethral, or vaginal), first void urine, and ThinPrep     liquid based cytology samples.     Performed at [facility] DIFFICILE BY PCR     Status: None   Collection Time    11/07/13 10:35 PM      Result Value Ref Range Status   C difficile by pcr NEGATIVE  NEGATIVE Final     Studies: No results found.  Scheduled Meds: . heparin  5,000 Units Subcutaneous 3 times per day  . levofloxacin (LEVAQUIN) IV  750 mg Intravenous Q24H  . pantoprazole  40 mg Oral Q1200  . sulfamethoxazole-trimethoprim  340 mg of trimethoprim Intravenous Q6H   Continuous Infusions:  Antibiotics Given (last 72 hours)   Date/Time Action Medication Dose Rate   11/07/13 1540 Given  vancomycin (VANCOCIN) IVPB 1000 mg/200 mL premix 1,000 mg 200 mL/hr   11/08/13 0029 Given   vancomycin (VANCOCIN) IVPB 1000 mg/200 mL premix 1,000 mg 200 mL/hr   11/08/13 0549 Given   levofloxacin (LEVAQUIN) IVPB 750 mg 750 mg 100 mL/hr   11/08/13 0734 Given   vancomycin (VANCOCIN) IVPB 1000 mg/200 mL premix 1,000 mg 200 mL/hr   11/08/13 1621 Given   vancomycin (VANCOCIN) IVPB 1000 mg/200 mL premix 1,000 mg 200 mL/hr   11/08/13 2308 Given   vancomycin (VANCOCIN) IVPB 1000  mg/200 mL premix 1,000 mg 200 mL/hr   11/09/13 0532 Given   levofloxacin (LEVAQUIN) IVPB 750 mg 750 mg 100 mL/hr   11/09/13 0719 Given   vancomycin (VANCOCIN) IVPB 1000 mg/200 mL premix 1,000 mg 200 mL/hr   11/09/13 1951 Given  [not here from pharm]   sulfamethoxazole-trimethoprim (BACTRIM) 340 mg of trimethoprim in dextrose 5 % 500 mL IVPB 340 mg of trimethoprim 347.5 mL/hr   11/10/13 0211 Given   sulfamethoxazole-trimethoprim (BACTRIM) 340 mg of trimethoprim in dextrose 5 % 500 mL IVPB 340 mg of trimethoprim 347.5 mL/hr   11/10/13 0521 Given   levofloxacin (LEVAQUIN) IVPB 750 mg 750 mg 100 mL/hr      Principal Problem:   Pneumonia Active Problems:   Asthma   HIV antibody positive   Elevated liver enzymes   History of shingles   Obesity    Time spent: 25 min    Dominico Rod, [facility] Pager [phone]. If 7PM-7AM, please contact night-coverage at [email or web address], password [email or web address] 11/10/2013, 10:01 AM  LOS: 3 days

## 2013-11-10 NOTE — Progress Notes (Addendum)
INFECTIOUS DISEASE PROGRESS NOTE  ID: Jim Wyatt is a 33 y.o. male with  Principal Problem:   Pneumonia Active Problems:   Asthma   HIV antibody positive   Elevated liver enzymes   History of shingles   Obesity  Subjective: N/v last pm with "d" medication  Abtx:  Anti-infectives   Start     Dose/Rate Route Frequency Ordered Stop   11/10/13 0200  sulfamethoxazole-trimethoprim (BACTRIM) 340 mg of trimethoprim in dextrose 5 % 500 mL IVPB     340 mg of trimethoprim 347.5 mL/hr over 90 Minutes Intravenous Every 6 hours 11/09/13 2056     11/09/13 1800  sulfamethoxazole-trimethoprim (BACTRIM) 340 mg of trimethoprim in dextrose 5 % 500 mL IVPB  Status:  Discontinued     340 mg of trimethoprim 347.5 mL/hr over 90 Minutes Intravenous 4 times per day 11/09/13 1637 11/09/13 2056   11/07/13 0800  vancomycin (VANCOCIN) IVPB 1000 mg/200 mL premix  Status:  Discontinued     1,000 mg 200 mL/hr over 60 Minutes Intravenous Every 8 hours 11/07/13 0542 11/09/13 1603   11/07/13 0600  levofloxacin (LEVAQUIN) IVPB 750 mg     750 mg 100 mL/hr over 90 Minutes Intravenous Every 24 hours 11/07/13 0539     11/07/13 0545  vancomycin (VANCOCIN) IVPB 1000 mg/200 mL premix  Status:  Discontinued     1,000 mg 200 mL/hr over 60 Minutes Intravenous  Once 11/07/13 0539 11/07/13 0557   11/07/13 0030  levofloxacin (LEVAQUIN) IVPB 750 mg     750 mg 100 mL/hr over 90 Minutes Intravenous  Once 11/07/13 0029 11/07/13 0235   11/07/13 0030  vancomycin (VANCOCIN) IVPB 1000 mg/200 mL premix     1,000 mg 200 mL/hr over 60 Minutes Intravenous  Once 11/07/13 0029 11/07/13 0405      Medications:  Scheduled: . heparin  5,000 Units Subcutaneous 3 times per day  . levofloxacin (LEVAQUIN) IV  750 mg Intravenous Q24H  . pantoprazole  40 mg Oral Q1200  . sulfamethoxazole-trimethoprim  340 mg of trimethoprim Intravenous Q6H    Objective: Vital signs in last 24 hours: Temp:  [99.2 F (37.3 C)-102.6 F (39.2 C)]  99.8 F (37.7 C) (09/14 0513) Pulse Rate:  [98-126] 98 (09/14 0513) Resp:  [18-24] 18 (09/14 0513) BP: (104-127)/(62-67) 104/62 mmHg (09/14 0513) SpO2:  [95 %-100 %] 97 % (09/14 0513)   General appearance: alert, cooperative and no distress Resp: diminished breath sounds bilaterally Cardio: regular rate and rhythm GI: normal findings: bowel sounds normal and soft, non-tender  Lab Results  Recent Labs  11/08/13 0135  WBC 8.5  HGB 12.5*  HCT 36.5*  NA 130*  K 3.8  CL 97  CO2 21  BUN 11  CREATININE 0.98   Liver Panel No results found for this basename: PROT, ALBUMIN, AST, ALT, ALKPHOS, BILITOT, BILIDIR, IBILI,  in the last 72 hours Sedimentation Rate No results found for this basename: ESRSEDRATE,  in the last 72 hours C-Reactive Protein No results found for this basename: CRP,  in the last 72 hours  Microbiology: Recent Results (from the past 240 hour(s))  CULTURE, BLOOD (ROUTINE X 2)     Status: None   Collection Time    11/07/13 12:23 AM      Result Value Ref Range Status   Specimen Description BLOOD RIGHT HAND   Final   Special Requests BOTTLES DRAWN AEROBIC AND ANAEROBIC 5CC   Final   Culture  Setup Time  Final   Value: 11/07/2013 04:48     Performed at [facility]   Culture     Final   Value:        BLOOD CULTURE RECEIVED NO GROWTH TO DATE CULTURE WILL BE HELD FOR 5 DAYS BEFORE ISSUING A FINAL NEGATIVE REPORT     Performed at [facility]   Report Status PENDING   Incomplete  CULTURE, BLOOD (ROUTINE X 2)     Status: None   Collection Time    11/07/13 12:28 AM      Result Value Ref Range Status   Specimen Description BLOOD LEFT ANTECUBITAL   Final   Special Requests BOTTLES DRAWN AEROBIC AND ANAEROBIC 10CC   Final   Culture  Setup Time     Final   Value: 11/07/2013 04:48     Performed at [facility]   Culture     Final   Value:        BLOOD CULTURE RECEIVED NO GROWTH TO DATE CULTURE WILL BE HELD FOR 5 DAYS BEFORE ISSUING A  FINAL NEGATIVE REPORT     Performed at [facility]   Report Status PENDING   Incomplete  URINE CULTURE     Status: None   Collection Time    11/07/13  4:52 PM      Result Value Ref Range Status   Specimen Description URINE, RANDOM   Final   Special Requests NONE   Final   Culture  Setup Time     Final   Value: 11/07/2013 19:58     Performed at [place]     Final   Value: NO GROWTH     Performed at [facility]   Culture     Final   Value: NO GROWTH     Performed at [facility]   Report Status 11/09/2013 FINAL   Final  GC/CHLAMYDIA PROBE AMP     Status: None   Collection Time    11/07/13  4:52 PM      Result Value Ref Range Status   CT Probe RNA NEGATIVE  NEGATIVE Final   GC Probe RNA NEGATIVE  NEGATIVE Final   Comment: (NOTE)                                                                                               **Normal Reference Range: Negative**          Assay performed using the Gen-Probe APTIMA COMBO2 (R) Assay.     Acceptable specimen types for this assay include APTIMA Swabs (Unisex,     endocervical, urethral, or vaginal), first void urine, and ThinPrep     liquid based cytology samples.     Performed at [facility] DIFFICILE BY PCR     Status: None   Collection Time    11/07/13 10:35 PM      Result Value Ref Range Status   C difficile by pcr NEGATIVE  NEGATIVE Final    Studies/Results: No results found.   Assessment/Plan: Pneumonia HIV+  Total  days of antibiotics: levaquin (day 4), bactrim day 2  Can change anbx (bactrim and leavquin) to PO Await CD4 and HIV RNA, genotype, HLA, RPR, GC/Chlamydia Check PPD/quantiferon He is not hypoxic Hold ART to f/u He has informed his partner         Bobby Rumpf Infectious Diseases (pager) [phone] [email or web address] 11/10/2013, 10:38 AM  LOS: 3 days

## 2013-11-10 NOTE — Progress Notes (Signed)
Patient refuses heparin s.q. He believes it is causing blood in his urine last time blood in urine was a.m. 11-09-2013 on 1st shift per patient

## 2013-11-10 NOTE — Progress Notes (Signed)
Patient c/o of nausea and some vomiting with I.V. Bactrium. Kathline [employer] N.P.  Paged and made aware and change Zofran orders change to q4hrs.

## 2013-11-11 ENCOUNTER — Inpatient Hospital Stay (HOSPITAL_COMMUNITY): Payer: Self-pay

## 2013-11-11 DIAGNOSIS — R509 Fever, unspecified: Secondary | ICD-10-CM

## 2013-11-11 DIAGNOSIS — B20 Human immunodeficiency virus [HIV] disease: Principal | ICD-10-CM

## 2013-11-11 DIAGNOSIS — N179 Acute kidney failure, unspecified: Secondary | ICD-10-CM

## 2013-11-11 LAB — BASIC METABOLIC PANEL
Anion gap: 15 (ref 5–15)
Anion gap: 12 (ref 5–15)
BUN: 20 mg/dL (ref 6–23)
BUN: 21 mg/dL (ref 6–23)
CO2: 20 mEq/L (ref 19–32)
CO2: 21 mEq/L (ref 19–32)
Calcium: 8 mg/dL — ABNORMAL LOW (ref 8.4–10.5)
Calcium: 8.1 mg/dL — ABNORMAL LOW (ref 8.4–10.5)
Chloride: 94 mEq/L — ABNORMAL LOW (ref 96–112)
Chloride: 97 mEq/L (ref 96–112)
Creatinine, Ser: 2.55 mg/dL — ABNORMAL HIGH (ref 0.50–1.35)
Creatinine, Ser: 2.37 mg/dL — ABNORMAL HIGH (ref 0.50–1.35)
GFR calc Af Amer: 37 mL/min — ABNORMAL LOW (ref >90)
GFR calc Af Amer: 41 mL/min — ABNORMAL LOW (ref >90)
GFR calc non Af Amer: 32 mL/min — ABNORMAL LOW (ref >90)
GFR calc non Af Amer: 35 mL/min — ABNORMAL LOW (ref >90)
Glucose, Bld: 79 mg/dL (ref 70–99)
Glucose, Bld: 91 mg/dL (ref 70–99)
Potassium: 4.3 mEq/L (ref 3.7–5.3)
Potassium: 4.3 mEq/L (ref 3.7–5.3)
Sodium: 129 mEq/L — ABNORMAL LOW (ref 137–147)
Sodium: 130 mEq/L — ABNORMAL LOW (ref 137–147)

## 2013-11-11 LAB — CBC
HCT: 35.9 % — ABNORMAL LOW (ref 39.0–52.0)
Hemoglobin: 12.3 g/dL — ABNORMAL LOW (ref 13.0–17.0)
MCH: 26.3 pg (ref 26.0–34.0)
MCHC: 34.3 g/dL (ref 30.0–36.0)
MCV: 76.9 fL — ABNORMAL LOW (ref 78.0–100.0)
Platelets: 104 10*3/uL — ABNORMAL LOW (ref 150–400)
RBC: 4.67 MIL/uL (ref 4.22–5.81)
RDW: 13.9 % (ref 11.5–15.5)
WBC: 5.6 10*3/uL (ref 4.0–10.5)

## 2013-11-11 LAB — HIV-1 RNA ULTRAQUANT REFLEX TO GENTYP+
HIV 1 RNA Quant: 67561 copies/mL — ABNORMAL HIGH (ref <20)
HIV-1 RNA Quant, Log: 4.83 {Log} — ABNORMAL HIGH (ref <1.30)

## 2013-11-11 MED ORDER — SULFAMETHOXAZOLE-TMP DS 800-160 MG PO TABS
2.0000 | ORAL_TABLET | Freq: Three times a day (TID) | ORAL | Status: DC
  Administered 2013-11-11 – 2013-11-12 (×3): 2 via ORAL
  Filled 2013-11-11 (×5): qty 2

## 2013-11-11 MED ORDER — LEVOFLOXACIN 750 MG PO TABS
750.0000 mg | ORAL_TABLET | ORAL | Status: DC
  Administered 2013-11-13: 750 mg via ORAL
  Filled 2013-11-11 (×2): qty 1

## 2013-11-11 MED ORDER — SODIUM CHLORIDE 0.9 % IV BOLUS (SEPSIS)
500.0000 mL | Freq: Once | INTRAVENOUS | Status: AC
  Administered 2013-11-11: 500 mL via INTRAVENOUS

## 2013-11-11 MED ORDER — SODIUM CHLORIDE 0.9 % IV SOLN
INTRAVENOUS | Status: DC
  Administered 2013-11-11 – 2013-11-14 (×3): via INTRAVENOUS

## 2013-11-11 NOTE — Progress Notes (Signed)
INFECTIOUS DISEASE PROGRESS NOTE  ID: Jim Wyatt is a 33 y.o. male with  Principal Problem:   Pneumonia Active Problems:   Asthma   HIV antibody positive   Elevated liver enzymes   History of shingles   Obesity  Subjective: C/o dizziness. No SOB  Abtx:  Anti-infectives   Start     Dose/Rate Route Frequency Ordered Stop   11/11/13 0500  levofloxacin (LEVAQUIN) tablet 750 mg     750 mg Oral Every 24 hours 11/10/13 1042     11/10/13 1200  sulfamethoxazole-trimethoprim (BACTRIM DS) 800-160 MG per tablet 2 tablet     2 tablet Oral 4 times daily 11/10/13 1100     11/10/13 0200  sulfamethoxazole-trimethoprim (BACTRIM) 340 mg of trimethoprim in dextrose 5 % 500 mL IVPB  Status:  Discontinued     340 mg of trimethoprim 347.5 mL/hr over 90 Minutes Intravenous Every 6 hours 11/09/13 2056 11/10/13 1100   11/09/13 1800  sulfamethoxazole-trimethoprim (BACTRIM) 340 mg of trimethoprim in dextrose 5 % 500 mL IVPB  Status:  Discontinued     340 mg of trimethoprim 347.5 mL/hr over 90 Minutes Intravenous 4 times per day 11/09/13 1637 11/09/13 2056   11/07/13 0800  vancomycin (VANCOCIN) IVPB 1000 mg/200 mL premix  Status:  Discontinued     1,000 mg 200 mL/hr over 60 Minutes Intravenous Every 8 hours 11/07/13 0542 11/09/13 1603   11/07/13 0600  levofloxacin (LEVAQUIN) IVPB 750 mg  Status:  Discontinued     750 mg 100 mL/hr over 90 Minutes Intravenous Every 24 hours 11/07/13 0539 11/10/13 1042   11/07/13 0545  vancomycin (VANCOCIN) IVPB 1000 mg/200 mL premix  Status:  Discontinued     1,000 mg 200 mL/hr over 60 Minutes Intravenous  Once 11/07/13 0539 11/07/13 0557   11/07/13 0030  levofloxacin (LEVAQUIN) IVPB 750 mg     750 mg 100 mL/hr over 90 Minutes Intravenous  Once 11/07/13 0029 11/07/13 0235   11/07/13 0030  vancomycin (VANCOCIN) IVPB 1000 mg/200 mL premix     1,000 mg 200 mL/hr over 60 Minutes Intravenous  Once 11/07/13 0029 11/07/13 0405      Medications:  Scheduled: .  heparin  5,000 Units Subcutaneous 3 times per day  . levofloxacin  750 mg Oral Q24H  . nystatin  5 mL Oral QID  . pantoprazole  40 mg Oral Q1200  . sulfamethoxazole-trimethoprim  2 tablet Oral QID    Objective: Vital signs in last 24 hours: Temp:  [97.9 F (36.6 C)-101.7 F (38.7 C)] 97.9 F (36.6 C) (09/15 0535) Pulse Rate:  [82-110] 82 (09/15 0535) Resp:  [18-20] 19 (09/15 0535) BP: (109-127)/(53-66) 109/53 mmHg (09/15 0535) SpO2:  [97 %-100 %] 100 % (09/15 0535)   General appearance: alert, cooperative and no distress Resp: clear to auscultation bilaterally Cardio: regular rate and rhythm GI: normal findings: bowel sounds normal and soft, non-tender  Lab Results  Recent Labs  11/11/13 0333  WBC 5.6  HGB 12.3*  HCT 35.9*  NA 129*  K 4.3  CL 94*  CO2 20  BUN 20  CREATININE 2.55*   Liver Panel No results found for this basename: PROT, ALBUMIN, AST, ALT, ALKPHOS, BILITOT, BILIDIR, IBILI,  in the last 72 hours Sedimentation Rate No results found for this basename: ESRSEDRATE,  in the last 72 hours C-Reactive Protein No results found for this basename: CRP,  in the last 72 hours  Microbiology: Recent Results (from the past 240 hour(s))  CULTURE, BLOOD (  ROUTINE X 2)     Status: None   Collection Time    11/07/13 12:23 AM      Result Value Ref Range Status   Specimen Description BLOOD RIGHT HAND   Final   Special Requests BOTTLES DRAWN AEROBIC AND ANAEROBIC 5CC   Final   Culture  Setup Time     Final   Value: 11/07/2013 04:48     Performed at [facility]   Culture     Final   Value:        BLOOD CULTURE RECEIVED NO GROWTH TO DATE CULTURE WILL BE HELD FOR 5 DAYS BEFORE ISSUING A FINAL NEGATIVE REPORT     Performed at [facility]   Report Status PENDING   Incomplete  CULTURE, BLOOD (ROUTINE X 2)     Status: None   Collection Time    11/07/13 12:28 AM      Result Value Ref Range Status   Specimen Description BLOOD LEFT ANTECUBITAL   Final    Special Requests BOTTLES DRAWN AEROBIC AND ANAEROBIC 10CC   Final   Culture  Setup Time     Final   Value: 11/07/2013 04:48     Performed at [facility]   Culture     Final   Value:        BLOOD CULTURE RECEIVED NO GROWTH TO DATE CULTURE WILL BE HELD FOR 5 DAYS BEFORE ISSUING A FINAL NEGATIVE REPORT     Performed at [facility]   Report Status PENDING   Incomplete  URINE CULTURE     Status: None   Collection Time    11/07/13  4:52 PM      Result Value Ref Range Status   Specimen Description URINE, RANDOM   Final   Special Requests NONE   Final   Culture  Setup Time     Final   Value: 11/07/2013 19:58     Performed at [facility]     Final   Value: NO GROWTH     Performed at [facility]   Culture     Final   Value: NO GROWTH     Performed at [facility]   Report Status 11/09/2013 FINAL   Final  GC/CHLAMYDIA PROBE AMP     Status: None   Collection Time    11/07/13  4:52 PM      Result Value Ref Range Status   CT Probe RNA NEGATIVE  NEGATIVE Final   GC Probe RNA NEGATIVE  NEGATIVE Final   Comment: (NOTE)                                                                                               **Normal Reference Range: Negative**          Assay performed using the Gen-Probe APTIMA COMBO2 (R) Assay.     Acceptable specimen types for this assay include APTIMA Swabs (Unisex,     endocervical, urethral, or vaginal), first void urine, and ThinPrep     liquid based cytology samples.  Performed at [facility] DIFFICILE BY PCR     Status: None   Collection Time    11/07/13 10:35 PM      Result Value Ref Range Status   C difficile by pcr NEGATIVE  NEGATIVE Final  RESPIRATORY VIRUS PANEL     Status: None   Collection Time    11/09/13  3:19 PM      Result Value Ref Range Status   Source - RVPAN NASAL SWAB   Corrected   Comment: CORRECTED ON 09/14 AT 2100: PREVIOUSLY REPORTED AS NASAL SWAB    Respiratory Syncytial Virus A NOT DETECTED   Final   Respiratory Syncytial Virus B NOT DETECTED   Final   Influenza A NOT DETECTED   Final   Influenza B NOT DETECTED   Final   Parainfluenza 1 NOT DETECTED   Final   Parainfluenza 2 NOT DETECTED   Final   Parainfluenza 3 NOT DETECTED   Final   Metapneumovirus NOT DETECTED   Final   Rhinovirus NOT DETECTED   Final   Adenovirus NOT DETECTED   Final   Influenza A H1 NOT DETECTED   Final   Influenza A H3 NOT DETECTED   Final   Comment: (NOTE)           Normal Reference Range for each Analyte: NOT DETECTED     Testing performed using the Lumine[employer] xTAG Respiratory Viral Panel test     kit.     The analytical performance characteristics of this assay have been     determined by [facility].  The modifications have not been     cleared or approved by the FDA. This assay has been validated pursuant     to the CLIA regulations and is used for clinical purposes.     Performed at [facility]    Studies/Results: No results found.   Assessment/Plan: AIDS Pneumonia ARF  Will decrease his Bactrim dose to TID Await quantiferon Await his HIV RNA and genotype Not clear why his Cr increased (dramatically). Will recheck to see if we can still use bactrim, change dose or use alternate.   Total days of antibiotics levaquin (day 5), bactrim day 3          Bobby Rumpf Infectious Diseases (pager) [phone] [email or web address] 11/11/2013, 9:56 AM  LOS: 4 days

## 2013-11-11 NOTE — Progress Notes (Addendum)
PROGRESS NOTE  Jim Wyatt LEX:[id number] DOB: Sep 27, 1980 DOA: 11/07/2013 PCP: No PCP Per Patient  Jim Wyatt is a 33 y.o. male with a past medical history of asthma, and a recently treated community-acquired pneumonia, who presents with a chest pain, shortness of breath and abdominal pain.  The patient was seen on 9/5 and diagnosed with community-acquired pneumonia. He was discharged home with doxycycline. He has been compliant with medication. His condition has improved initially. But 2 days ago, he started having chest pain again. It is located in the front chest. It is aggravated by coughing and deep breath. He also has a fever and chills. He has mild cough with very little sputum production. He took ibuprofen for chest pain, 12 pills in the past 2 days. During the past 2 days, he used albuterol inhaler very frequently, approximately once every hour. He has been diagnosed with new HIV.  Seen by ID.  Developed AKI and is getting IVF  Assessment/Plan: Healthcare associated pneumonia:  -CT chest and abdomen reveals left pulmonary infiltrates.  -blood culture x 2 NGTD -urine legionella  -S. pneumococcal antigen negative  -HIV positive -Levaquin  - bactrim added for PCP coverage -change both to PO- time with meals to lessen nausea -fever curve has improved  AKI -stat repeat IVF -U/S ok  HIV positive -Appreciate ID  abdominal pain and diarrhea: It is likely due to ibuprofen-induced gastritis Diarrhea resolved -C diff PCR neg -Protonix 40 mg daily  - Zofran for nausea  CT scan done  Asthma: stable. No wheezing on lung auscultation  -Albuterol inhaler when necessary   Hematuria  -resolved -?UTI- culture pending- NGTD -no pain so doubt stone- just had CT scan of abd  Refusing heparin -SCDS and ambualtion  Code Status: full Family Communication: patient Disposition Plan: home soon   Consultants:       HPI/Subjective: Feeling better Some dizziness with  standing up   Objective: Filed Vitals:   11/11/13 1005  BP: 125/74  Pulse: 87  Temp: 98.6 F (37 C)  Resp:     Intake/Output Summary (Last 24 hours) at 11/11/13 1045 Last data filed at 11/10/13 1708  Gross per 24 hour  Intake    480 ml  Output   1051 ml  Net   -571 ml   Filed Weights   11/07/13 0530  Weight: 94.439 kg (208 lb 3.2 oz)    Exam:   General:  A+Ox3, NAD  Cardiovascular: rrr  Respiratory: clear, no wheezing  Abdomen: +BS, soft  Musculoskeletal: no edema   Data Reviewed: Basic Metabolic Panel:  Recent Labs Lab 11/07/13 0023 11/07/13 0855 11/08/13 0135 11/11/13 0333  NA 135* 133* 130* 129*  K 4.8 4.0 3.8 4.3  CL 98 98 97 94*  CO2 '20 24 21 20  ' GLUCOSE 80 116* 136* 79  BUN '10 8 11 20  ' CREATININE 0.86 0.96 0.98 2.55*  CALCIUM 8.9 8.3* 8.2* 8.0*   Liver Function Tests:  Recent Labs Lab 11/07/13 0023 11/07/13 0855  AST 68* 49*  ALT 64* 51  ALKPHOS 55 52  BILITOT 0.5 0.6  PROT 7.8 7.0  ALBUMIN 2.5* 2.2*    Recent Labs Lab 11/07/13 0023  LIPASE 35   No results found for this basename: AMMONIA,  in the last 168 hours CBC:  Recent Labs Lab 11/07/13 0855 11/08/13 0135 11/11/13 0333  WBC 7.9 8.5 5.6  NEUTROABS 5.8  --   --   HGB 12.2* 12.5* 12.3*  HCT 35.9* 36.5*  35.9*  MCV 80.5 78.3 76.9*  PLT 152 155 104*   Cardiac Enzymes:  Recent Labs Lab 11/07/13 0855  TROPONINI <0.30   BNP (last 3 results)  Recent Labs  11/07/13 0855  PROBNP 33.5   CBG: No results found for this basename: GLUCAP,  in the last 168 hours  Recent Results (from the past 240 hour(s))  CULTURE, BLOOD (ROUTINE X 2)     Status: None   Collection Time    11/07/13 12:23 AM      Result Value Ref Range Status   Specimen Description BLOOD RIGHT HAND   Final   Special Requests BOTTLES DRAWN AEROBIC AND ANAEROBIC 5CC   Final   Culture  Setup Time     Final   Value: 11/07/2013 04:48     Performed at [facility]   Culture     Final    Value:        BLOOD CULTURE RECEIVED NO GROWTH TO DATE CULTURE WILL BE HELD FOR 5 DAYS BEFORE ISSUING A FINAL NEGATIVE REPORT     Performed at [facility]   Report Status PENDING   Incomplete  CULTURE, BLOOD (ROUTINE X 2)     Status: None   Collection Time    11/07/13 12:28 AM      Result Value Ref Range Status   Specimen Description BLOOD LEFT ANTECUBITAL   Final   Special Requests BOTTLES DRAWN AEROBIC AND ANAEROBIC 10CC   Final   Culture  Setup Time     Final   Value: 11/07/2013 04:48     Performed at [facility]   Culture     Final   Value:        BLOOD CULTURE RECEIVED NO GROWTH TO DATE CULTURE WILL BE HELD FOR 5 DAYS BEFORE ISSUING A FINAL NEGATIVE REPORT     Performed at [facility]   Report Status PENDING   Incomplete  URINE CULTURE     Status: None   Collection Time    11/07/13  4:52 PM      Result Value Ref Range Status   Specimen Description URINE, RANDOM   Final   Special Requests NONE   Final   Culture  Setup Time     Final   Value: 11/07/2013 19:58     Performed at [place]     Final   Value: NO GROWTH     Performed at [facility]   Culture     Final   Value: NO GROWTH     Performed at [facility]   Report Status 11/09/2013 FINAL   Final  GC/CHLAMYDIA PROBE AMP     Status: None   Collection Time    11/07/13  4:52 PM      Result Value Ref Range Status   CT Probe RNA NEGATIVE  NEGATIVE Final   GC Probe RNA NEGATIVE  NEGATIVE Final   Comment: (NOTE)                                                                                               **  Normal Reference Range: Negative**          Assay performed using the Gen-Probe APTIMA COMBO2 (R) Assay.     Acceptable specimen types for this assay include APTIMA Swabs (Unisex,     endocervical, urethral, or vaginal), first void urine, and ThinPrep     liquid based cytology samples.     Performed at [facility] DIFFICILE BY  PCR     Status: None   Collection Time    11/07/13 10:35 PM      Result Value Ref Range Status   C difficile by pcr NEGATIVE  NEGATIVE Final  RESPIRATORY VIRUS PANEL     Status: None   Collection Time    11/09/13  3:19 PM      Result Value Ref Range Status   Source - RVPAN NASAL SWAB   Corrected   Comment: CORRECTED ON 09/14 AT 2100: PREVIOUSLY REPORTED AS NASAL SWAB   Respiratory Syncytial Virus A NOT DETECTED   Final   Respiratory Syncytial Virus B NOT DETECTED   Final   Influenza A NOT DETECTED   Final   Influenza B NOT DETECTED   Final   Parainfluenza 1 NOT DETECTED   Final   Parainfluenza 2 NOT DETECTED   Final   Parainfluenza 3 NOT DETECTED   Final   Metapneumovirus NOT DETECTED   Final   Rhinovirus NOT DETECTED   Final   Adenovirus NOT DETECTED   Final   Influenza A H1 NOT DETECTED   Final   Influenza A H3 NOT DETECTED   Final   Comment: (NOTE)           Normal Reference Range for each Analyte: NOT DETECTED     Testing performed using the Luminex xTAG Respiratory Viral Panel test     kit.     The analytical performance characteristics of this assay have been     determined by [facility].  The modifications have not been     cleared or approved by the FDA. This assay has been validated pursuant     to the CLIA regulations and is used for clinical purposes.     Performed at [facility]     Studies: US Renal  11/11/2013   CLINICAL DATA:  Acute renal injury, fever  EXAM: RENAL/URINARY TRACT ULTRASOUND COMPLETE  COMPARISON:  None.  FINDINGS: Right Kidney:  Length: 12.1 cm. Diffuse increased echogenicity of the right kidney is noted.  Left Kidney:  Length: 12.7 cm. Echogenicity within normal limits. No mass or hydronephrosis visualized.  Bladder:  Incompletely distended.  No gross abnormality is noted.  IMPRESSION: Hyperechoic right kidney. This may be related to a component of medical renal disease. No other focal abnormality is noted.   Electronically Signed    By: Inez Catalina M.D.   On: 11/11/2013 10:07    Scheduled Meds: . heparin  5,000 Units Subcutaneous 3 times per day  . levofloxacin  750 mg Oral Q24H  . nystatin  5 mL Oral QID  . pantoprazole  40 mg Oral Q1200  . sulfamethoxazole-trimethoprim  2 tablet Oral TID   Continuous Infusions: . sodium chloride 125 mL/hr at 11/11/13 0742   Antibiotics Given (last 72 hours)   Date/Time Action Medication Dose Rate   11/08/13 1621 Given   vancomycin (VANCOCIN) IVPB 1000 mg/200 mL premix 1,000 mg 200 mL/hr   11/08/13 2308 Given   vancomycin (VANCOCIN) IVPB 1000 mg/200 mL premix 1,000 mg 200  mL/hr   11/09/13 0532 Given   levofloxacin (LEVAQUIN) IVPB 750 mg 750 mg 100 mL/hr   11/09/13 0719 Given   vancomycin (VANCOCIN) IVPB 1000 mg/200 mL premix 1,000 mg 200 mL/hr   11/09/13 1951 Given  [not here from pharm]   sulfamethoxazole-trimethoprim (BACTRIM) 340 mg of trimethoprim in dextrose 5 % 500 mL IVPB 340 mg of trimethoprim 347.5 mL/hr   11/10/13 0211 Given   sulfamethoxazole-trimethoprim (BACTRIM) 340 mg of trimethoprim in dextrose 5 % 500 mL IVPB 340 mg of trimethoprim 347.5 mL/hr   11/10/13 0521 Given   levofloxacin (LEVAQUIN) IVPB 750 mg 750 mg 100 mL/hr   11/10/13 1155 Given   sulfamethoxazole-trimethoprim (BACTRIM DS) 800-160 MG per tablet 2 tablet 2 tablet    11/10/13 1821 Given   sulfamethoxazole-trimethoprim (BACTRIM DS) 800-160 MG per tablet 2 tablet 2 tablet    11/10/13 2221 Given   sulfamethoxazole-trimethoprim (BACTRIM DS) 800-160 MG per tablet 2 tablet 2 tablet    11/11/13 0551 Given   levofloxacin (LEVAQUIN) tablet 750 mg 750 mg    11/11/13 1006 Given   sulfamethoxazole-trimethoprim (BACTRIM DS) 800-160 MG per tablet 2 tablet 2 tablet       Principal Problem:   Pneumonia Active Problems:   Asthma   HIV antibody positive   Elevated liver enzymes   History of shingles   Obesity    Time spent: 25 min    Hermie Reagor, [facility] Pager [phone]. If  7PM-7AM, please contact night-coverage at [email or web address], password [facility] 11/11/2013, 10:45 AM  LOS: 4 days

## 2013-11-11 NOTE — Progress Notes (Signed)
Pt complains of feeling "light headed", vitals taken, ID MD at bedside, pt encouraged to have PO intake, pt told to not get up without assistance, will continue to monitor Rickard Rhymes, RN

## 2013-11-11 NOTE — Progress Notes (Addendum)
ANTIBIOTIC CONSULT NOTE - FOLLOW UP  Pharmacy Consult:  Bactrim Indication:  Rule out PCP PNA  Allergies  Allergen Reactions  . Augmentin [Amoxicillin-Pot Clavulanate] Other (See Comments)    unknown  . Banana   . Cauliflower [Brassica Oleracea Italica] Other (See Comments)    Pt states his throat closes up  . Other     Patient has multiple food allergies    Patient Measurements: Height: '5\' 7"'  (170.2 cm) Weight: 208 lb 3.2 oz (94.439 kg) IBW/kg (Calculated) : 66.1 Adjusted Body Weight: 77 kg  Vital Signs: Temp: 98.6 F (37 C) (09/15 1005) Temp src: Oral (09/15 1005) BP: 125/74 mmHg (09/15 1005) Pulse Rate: 87 (09/15 1005) Intake/Output from previous day: 09/14 0701 - 09/15 0700 In: 480 [P.O.:480] Out: 1051 [Urine:1050; Stool:1]  Labs:  Recent Labs  11/11/13 0333 11/11/13 1108  WBC 5.6  --   HGB 12.3*  --   PLT 104*  --   CREATININE 2.55* 2.37*   Estimated Creatinine Clearance: 49.9 ml/min (by C-G formula based on Cr of 2.37). No results found for this basename: VANCOTROUGH, VANCOPEAK, VANCORANDOM, [place], GENTPEAK, GENTRANDOM, TOBRATROUGH, TOBRAPEAK, TOBRARND, AMIKACINPEAK, AMIKACINTROU, AMIKACIN,  in the last 72 hours   Microbiology: Recent Results (from the past 720 hour(s))  CULTURE, BLOOD (ROUTINE X 2)     Status: None   Collection Time    11/07/13 12:23 AM      Result Value Ref Range Status   Specimen Description BLOOD RIGHT HAND   Final   Special Requests BOTTLES DRAWN AEROBIC AND ANAEROBIC 5CC   Final   Culture  Setup Time     Final   Value: 11/07/2013 04:48     Performed at [facility]   Culture     Final   Value:        BLOOD CULTURE RECEIVED NO GROWTH TO DATE CULTURE WILL BE HELD FOR 5 DAYS BEFORE ISSUING A FINAL NEGATIVE REPORT     Performed at [facility]   Report Status PENDING   Incomplete  CULTURE, BLOOD (ROUTINE X 2)     Status: None   Collection Time    11/07/13 12:28 AM      Result Value Ref Range Status   Specimen Description BLOOD LEFT ANTECUBITAL   Final   Special Requests BOTTLES DRAWN AEROBIC AND ANAEROBIC 10CC   Final   Culture  Setup Time     Final   Value: 11/07/2013 04:48     Performed at [facility]   Culture     Final   Value:        BLOOD CULTURE RECEIVED NO GROWTH TO DATE CULTURE WILL BE HELD FOR 5 DAYS BEFORE ISSUING A FINAL NEGATIVE REPORT     Performed at [facility]   Report Status PENDING   Incomplete  URINE CULTURE     Status: None   Collection Time    11/07/13  4:52 PM      Result Value Ref Range Status   Specimen Description URINE, RANDOM   Final   Special Requests NONE   Final   Culture  Setup Time     Final   Value: 11/07/2013 19:58     Performed at [facility]     Final   Value: NO GROWTH     Performed at [facility]   Culture     Final   Value: NO GROWTH     Performed at  [facility]   Report Status 11/09/2013 FINAL   Final  GC/CHLAMYDIA PROBE AMP     Status: None   Collection Time    11/07/13  4:52 PM      Result Value Ref Range Status   CT Probe RNA NEGATIVE  NEGATIVE Final   GC Probe RNA NEGATIVE  NEGATIVE Final   Comment: (NOTE)                                                                                               **Normal Reference Range: Negative**          Assay performed using the Gen-Probe APTIMA COMBO2 (R) Assay.     Acceptable specimen types for this assay include APTIMA Swabs (Unisex,     endocervical, urethral, or vaginal), first void urine, and ThinPrep     liquid based cytology samples.     Performed at [facility] DIFFICILE BY PCR     Status: None   Collection Time    11/07/13 10:35 PM      Result Value Ref Range Status   C difficile by pcr NEGATIVE  NEGATIVE Final  RESPIRATORY VIRUS PANEL     Status: None   Collection Time    11/09/13  3:19 PM      Result Value Ref Range Status   Source - RVPAN NASAL SWAB   Corrected   Comment: CORRECTED ON  09/14 AT 2100: PREVIOUSLY REPORTED AS NASAL SWAB   Respiratory Syncytial Virus A NOT DETECTED   Final   Respiratory Syncytial Virus B NOT DETECTED   Final   Influenza A NOT DETECTED   Final   Influenza B NOT DETECTED   Final   Parainfluenza 1 NOT DETECTED   Final   Parainfluenza 2 NOT DETECTED   Final   Parainfluenza 3 NOT DETECTED   Final   Metapneumovirus NOT DETECTED   Final   Rhinovirus NOT DETECTED   Final   Adenovirus NOT DETECTED   Final   Influenza A H1 NOT DETECTED   Final   Influenza A H3 NOT DETECTED   Final   Comment: (NOTE)           Normal Reference Range for each Analyte: NOT DETECTED     Testing performed using the Luminex xTAG Respiratory Viral Panel test     kit.     The analytical performance characteristics of this assay have been     determined by [facility].  The modifications have not been     cleared or approved by the FDA. This assay has been validated pursuant     to the CLIA regulations and is used for clinical purposes.     Performed at [facility]: 61 YOM who was admitted on 11/07/13 for worsening PNA despite outpatient treatment with doxycycline. The patient has been noted to have persistent fevers and lacking improvement in infection. HIV antibody test has resulted as positive and his CD4 count is low at 40.  Patient to continue on Bactrim for rule out  PCP PNA.  His renal function has significantly declined (confirmed).  Vanc 9/11 >> 9/13 LVQ 9/11 >>  Bactrim 9/13 >>  9/11 BCx x2 - NGTD 9/11 C.diff - neg 9/11 UCx - negative 9/11 S pneumo / Legionella - neg 9/11 HIV Ab - reactive 9/13 RVP - negative   Goal of Therapy:  Proper antibiotics for infection/cultures adjusted for renal/hepatic function    Plan:  - Decrease Bactrim to 2 DS tabs PO TID.  ID may switch to alternative therapy. - Change LVQ to 712m PO Q48H - Monitor renal function and K+ closely - F/U d/c ibuprofen    Hiran Leard D. DMina Marble PharmD,  BCPS Pager:  [phone]/15/2015, 12:59 PM

## 2013-11-11 NOTE — Care Management Note (Signed)
    Page 1 of 2   11/26/2013     10:50:41 AM CARE MANAGEMENT NOTE 11/26/2013  Patient:  Jim Wyatt, Jim Wyatt   Account Number:  [id number]  Date Initiated:  11/11/2013  Documentation initiated by:  Thyra Yinger  Subjective/Objective Assessment:   Pt adm on 11/06/13 with PNA, new 042 diagnosis.  PTA, pt independent, lives with girlfriend.     Action/Plan:   Will follow for dc needs as pt progresses.  Pt uninsured and has no PCP.   Anticipated DC Date:  11/25/2013   Anticipated DC Plan:  [place]  CM consult  Medication [facility]  Follow-up appt scheduled      Choice offered to / List presented to:             Status of service:  Completed, signed off Medicare Important Message given?   (If response is "NO", the following Medicare IM given date fields will be blank) Date Medicare IM given:   Medicare IM given by:   Date Additional Medicare IM given:   Additional Medicare IM given by:    Discharge Disposition:  HOME/SELF CARE  Per UR Regulation:  Reviewed for med. necessity/level of care/duration of stay  If discussed at [place] of Stay Meetings, dates discussed:   11/18/2013  11/20/2013  11/25/2013    Comments:  11/25/13 Ellan Lambert, RN, BSN [phone] Pt for dc home today with girlfriend, mother to assist.  Pt eligible for medication assistance through [facility] program.  Pt given [facility] letter with explanation of program benefits.  ID clinic to assist with costly HIV meds, per ID physician.  11/24/13 Ellan Lambert, RN, BSN [phone] Follow up appt made at [facility] for October 1 at 11:15.  Pt to dc on home Lovenox, and will need PT/INR check at this visit.  Clinic aware of need for PT/INR check at this appt.  Appt info put on AVS in EPIC.  11/18/13 Ellan Lambert, RN, BSN [phone] Pt's hosp stay complicated by acute kidney injury. Nephrology following; will cont to follow  progress.

## 2013-11-12 LAB — BASIC METABOLIC PANEL
Anion gap: 13 (ref 5–15)
BUN: 21 mg/dL (ref 6–23)
CO2: 19 mEq/L (ref 19–32)
Calcium: 7.7 mg/dL — ABNORMAL LOW (ref 8.4–10.5)
Chloride: 97 mEq/L (ref 96–112)
Creatinine, Ser: 2.35 mg/dL — ABNORMAL HIGH (ref 0.50–1.35)
GFR calc Af Amer: 41 mL/min — ABNORMAL LOW (ref >90)
GFR calc non Af Amer: 35 mL/min — ABNORMAL LOW (ref >90)
Glucose, Bld: 81 mg/dL (ref 70–99)
Potassium: 4.7 mEq/L (ref 3.7–5.3)
Sodium: 129 mEq/L — ABNORMAL LOW (ref 137–147)

## 2013-11-12 LAB — URINALYSIS, ROUTINE W REFLEX MICROSCOPIC
Bilirubin Urine: NEGATIVE
Glucose, UA: NEGATIVE mg/dL
Ketones, ur: NEGATIVE mg/dL
Leukocytes, UA: NEGATIVE
Nitrite: NEGATIVE
Protein, ur: 100 mg/dL — AB
Specific Gravity, Urine: 1.014 (ref 1.005–1.030)
Urobilinogen, UA: 1 mg/dL (ref 0.0–1.0)
pH: 7 (ref 5.0–8.0)

## 2013-11-12 LAB — URINE MICROSCOPIC-ADD ON

## 2013-11-12 LAB — QUANTIFERON TB GOLD ASSAY (BLOOD)
Interferon Gamma Release Assay: NEGATIVE
Mitogen value: 1.66 IU/mL
Quantiferon Nil Value: 0.42 IU/mL
TB Ag value: 0.38 IU/mL
TB Antigen Minus Nil Value: 0 IU/mL

## 2013-11-12 LAB — CBC
HCT: 35.2 % — ABNORMAL LOW (ref 39.0–52.0)
Hemoglobin: 11.9 g/dL — ABNORMAL LOW (ref 13.0–17.0)
MCH: 26.6 pg (ref 26.0–34.0)
MCHC: 33.8 g/dL (ref 30.0–36.0)
MCV: 78.6 fL (ref 78.0–100.0)
Platelets: 98 10*3/uL — ABNORMAL LOW (ref 150–400)
RBC: 4.48 MIL/uL (ref 4.22–5.81)
RDW: 14.1 % (ref 11.5–15.5)
WBC: 4.8 10*3/uL (ref 4.0–10.5)

## 2013-11-12 LAB — SODIUM, URINE, RANDOM: Sodium, Ur: 62 mEq/L

## 2013-11-12 LAB — CREATININE, URINE, RANDOM: Creatinine, Urine: 94.11 mg/dL

## 2013-11-12 MED ORDER — CYCLOBENZAPRINE HCL 10 MG PO TABS: 10.0000 mg | ORAL_TABLET | Freq: Once | ORAL | Status: DC

## 2013-11-12 MED ORDER — CLINDAMYCIN HCL 300 MG PO CAPS
300.0000 mg | ORAL_CAPSULE | Freq: Four times a day (QID) | ORAL | Status: DC
  Administered 2013-11-12 – 2013-11-25 (×51): 300 mg via ORAL
  Filled 2013-11-12 (×55): qty 1

## 2013-11-12 MED ORDER — OXYCODONE-ACETAMINOPHEN 5-325 MG PO TABS
1.0000 | ORAL_TABLET | Freq: Once | ORAL | Status: AC
  Administered 2013-11-12: 1 via ORAL
  Filled 2013-11-12: qty 1

## 2013-11-12 MED ORDER — PRIMAQUINE PHOSPHATE 26.3 MG PO TABS
30.0000 mg | ORAL_TABLET | Freq: Every day | ORAL | Status: DC
  Administered 2013-11-12 – 2013-11-25 (×14): 30 mg via ORAL
  Filled 2013-11-12 (×15): qty 2

## 2013-11-12 NOTE — Progress Notes (Signed)
Pt ambulated in hallway with gf.  Pt with no complaints, back to bed with call bell in reach.  Will continue to monitor.

## 2013-11-12 NOTE — Progress Notes (Signed)
PROGRESS NOTE  Jim Wyatt AGT:[id number] DOB: January 27, 1981 DOA: 11/07/2013 PCP: No PCP Per Patient  Jim Wyatt is a 33 y.o. male with a past medical history of asthma, and a recently treated community-acquired pneumonia, who presents with a chest pain, shortness of breath and abdominal pain.  The patient was seen on 9/5 and diagnosed with community-acquired pneumonia. He was discharged home with doxycycline. He has been compliant with medication. His condition has improved initially. But 2 days ago, he started having chest pain again. It is located in the front chest. It is aggravated by coughing and deep breath. He also has a fever and chills. He has mild cough with very little sputum production. He took ibuprofen for chest pain, 12 pills in the past 2 days. During the past 2 days, he used albuterol inhaler very frequently, approximately once every hour. He has been diagnosed with new HIV.  Seen by ID.  Developed AKI and is getting IVF  Assessment/Plan: Healthcare associated pneumonia:  -CT chest and abdomen reveals left pulmonary infiltrates.  -blood culture x 2 NGTD -urine legionella  -S. pneumococcal antigen negative  -HIV positive -Levaquin  - bactrim added for PCP coverage, But has to be stopped for acute renal failure. Changed to clindamycin.   AKI Could be secondary to combination of bactrim, vancomycin and contrast and nsaids int he last few days.  Bactrim and vancomycin stopped. Hydration and stop all nephrotoxic medications.  Repeat level improving. US RENAL  No hydronephrosis. Urine studies ordered and pending. Urine analysis ordered and repeat BMP ordered for am. If persistently high will call renal consult for further recommendations.   HIV positive -Appreciate ID  abdominal pain and diarrhea: It is likely due to ibuprofen-induced gastritis Diarrhea resolved -C diff PCR neg -Protonix 40 mg daily  - Zofran for nausea  CT scan done  Asthma: stable. No wheezing  on lung auscultation  -Albuterol inhaler when necessary   Hematuria  -resolved -?UTI- culture pending- NGTD -no pain so doubt stone- just had CT scan of abd  Refusing heparin -SCDS and ambulation.   Code Status: full Family Communication: patient Disposition Plan: home soon   Consultants:   ID    HPI/Subjective: Feeling better HAD A good sleep last night.    Objective: Filed Vitals:   11/12/13 1300  BP: 126/74  Pulse: 85  Temp: 98.7 F (37.1 C)  Resp: 18    Intake/Output Summary (Last 24 hours) at 11/12/13 1624 Last data filed at 11/12/13 0900  Gross per 24 hour  Intake    480 ml  Output   1125 ml  Net   -645 ml   Filed Weights   11/07/13 0530  Weight: 94.439 kg (208 lb 3.2 oz)    Exam:   General:  A+Ox3, NAD  Cardiovascular: rrr  Respiratory: clear, no wheezing  Abdomen: +BS, soft  Musculoskeletal: no edema   Data Reviewed: Basic Metabolic Panel:  Recent Labs Lab 11/07/13 0855 11/08/13 0135 11/11/13 0333 11/11/13 1108 11/12/13 0004  NA 133* 130* 129* 130* 129*  K 4.0 3.8 4.3 4.3 4.7  CL 98 97 94* 97 97  CO2 '24 21 20 21 19  ' GLUCOSE 116* 136* 79 91 81  BUN '8 11 20 21 21  ' CREATININE 0.96 0.98 2.55* 2.37* 2.35*  CALCIUM 8.3* 8.2* 8.0* 8.1* 7.7*   Liver Function Tests:  Recent Labs Lab 11/07/13 0023 11/07/13 0855  AST 68* 49*  ALT 64* 51  ALKPHOS 55 52  BILITOT 0.5  0.6  PROT 7.8 7.0  ALBUMIN 2.5* 2.2*    Recent Labs Lab 11/07/13 0023  LIPASE 35   No results found for this basename: AMMONIA,  in the last 168 hours CBC:  Recent Labs Lab 11/07/13 0855 11/08/13 0135 11/11/13 0333 11/12/13 0004  WBC 7.9 8.5 5.6 4.8  NEUTROABS 5.8  --   --   --   HGB 12.2* 12.5* 12.3* 11.9*  HCT 35.9* 36.5* 35.9* 35.2*  MCV 80.5 78.3 76.9* 78.6  PLT 152 155 104* 98*   Cardiac Enzymes:  Recent Labs Lab 11/07/13 0855  TROPONINI <0.30   BNP (last 3 results)  Recent Labs  11/07/13 0855  PROBNP 33.5   CBG: No results  found for this basename: GLUCAP,  in the last 168 hours  Recent Results (from the past 240 hour(s))  CULTURE, BLOOD (ROUTINE X 2)     Status: None   Collection Time    11/07/13 12:23 AM      Result Value Ref Range Status   Specimen Description BLOOD RIGHT HAND   Final   Special Requests BOTTLES DRAWN AEROBIC AND ANAEROBIC 5CC   Final   Culture  Setup Time     Final   Value: 11/07/2013 04:48     Performed at [facility]   Culture     Final   Value:        BLOOD CULTURE RECEIVED NO GROWTH TO DATE CULTURE WILL BE HELD FOR 5 DAYS BEFORE ISSUING A FINAL NEGATIVE REPORT     Performed at [facility]   Report Status PENDING   Incomplete  CULTURE, BLOOD (ROUTINE X 2)     Status: None   Collection Time    11/07/13 12:28 AM      Result Value Ref Range Status   Specimen Description BLOOD LEFT ANTECUBITAL   Final   Special Requests BOTTLES DRAWN AEROBIC AND ANAEROBIC 10CC   Final   Culture  Setup Time     Final   Value: 11/07/2013 04:48     Performed at [facility]   Culture     Final   Value:        BLOOD CULTURE RECEIVED NO GROWTH TO DATE CULTURE WILL BE HELD FOR 5 DAYS BEFORE ISSUING A FINAL NEGATIVE REPORT     Performed at [facility]   Report Status PENDING   Incomplete  URINE CULTURE     Status: None   Collection Time    11/07/13  4:52 PM      Result Value Ref Range Status   Specimen Description URINE, RANDOM   Final   Special Requests NONE   Final   Culture  Setup Time     Final   Value: 11/07/2013 19:58     Performed at [facility]     Final   Value: NO GROWTH     Performed at [facility]   Culture     Final   Value: NO GROWTH     Performed at [facility]   Report Status 11/09/2013 FINAL   Final  GC/CHLAMYDIA PROBE AMP     Status: None   Collection Time    11/07/13  4:52 PM      Result Value Ref Range Status   CT Probe RNA NEGATIVE  NEGATIVE Final   GC Probe RNA NEGATIVE  NEGATIVE Final    Comment: (NOTE)                                                                                               **  Normal Reference Range: Negative**          Assay performed using the Gen-Probe APTIMA COMBO2 (R) Assay.     Acceptable specimen types for this assay include APTIMA Swabs (Unisex,     endocervical, urethral, or vaginal), first void urine, and ThinPrep     liquid based cytology samples.     Performed at [facility] DIFFICILE BY PCR     Status: None   Collection Time    11/07/13 10:35 PM      Result Value Ref Range Status   C difficile by pcr NEGATIVE  NEGATIVE Final  RESPIRATORY VIRUS PANEL     Status: None   Collection Time    11/09/13  3:19 PM      Result Value Ref Range Status   Source - RVPAN NASAL SWAB   Corrected   Comment: CORRECTED ON 09/14 AT 2100: PREVIOUSLY REPORTED AS NASAL SWAB   Respiratory Syncytial Virus A NOT DETECTED   Final   Respiratory Syncytial Virus B NOT DETECTED   Final   Influenza A NOT DETECTED   Final   Influenza B NOT DETECTED   Final   Parainfluenza 1 NOT DETECTED   Final   Parainfluenza 2 NOT DETECTED   Final   Parainfluenza 3 NOT DETECTED   Final   Metapneumovirus NOT DETECTED   Final   Rhinovirus NOT DETECTED   Final   Adenovirus NOT DETECTED   Final   Influenza A H1 NOT DETECTED   Final   Influenza A H3 NOT DETECTED   Final   Comment: (NOTE)           Normal Reference Range for each Analyte: NOT DETECTED     Testing performed using the Luminex xTAG Respiratory Viral Panel test     kit.     The analytical performance characteristics of this assay have been     determined by [facility].  The modifications have not been     cleared or approved by the FDA. This assay has been validated pursuant     to the CLIA regulations and is used for clinical purposes.     Performed at [facility]     Studies: US Renal  11/11/2013   CLINICAL DATA:  Acute renal injury, fever  EXAM: RENAL/URINARY TRACT  ULTRASOUND COMPLETE  COMPARISON:  None.  FINDINGS: Right Kidney:  Length: 12.1 cm. Diffuse increased echogenicity of the right kidney is noted.  Left Kidney:  Length: 12.7 cm. Echogenicity within normal limits. No mass or hydronephrosis visualized.  Bladder:  Incompletely distended.  No gross abnormality is noted.  IMPRESSION: Hyperechoic right kidney. This may be related to a component of medical renal disease. No other focal abnormality is noted.   Electronically Signed   By: Inez Catalina M.D.   On: 11/11/2013 10:07    Scheduled Meds: . clindamycin  300 mg Oral 4 times per day  . [START ON 11/13/2013] levofloxacin  750 mg Oral Q48H  . nystatin  5 mL Oral QID  . pantoprazole  40 mg Oral Q1200  . primaquine  30 mg Oral Daily   Continuous Infusions: . sodium chloride 125 mL/hr at 11/11/13 0865   Antibiotics Given (last 72 hours)   Date/Time Action Medication Dose Rate   11/09/13 1951 Given  [not here from pharm]   sulfamethoxazole-trimethoprim (BACTRIM) 340 mg of trimethoprim in dextrose 5 % 500 mL IVPB 340 mg of trimethoprim 347.5 mL/hr  11/10/13 0211 Given   sulfamethoxazole-trimethoprim (BACTRIM) 340 mg of trimethoprim in dextrose 5 % 500 mL IVPB 340 mg of trimethoprim 347.5 mL/hr   11/10/13 0521 Given   levofloxacin (LEVAQUIN) IVPB 750 mg 750 mg 100 mL/hr   11/10/13 1155 Given   sulfamethoxazole-trimethoprim (BACTRIM DS) 800-160 MG per tablet 2 tablet 2 tablet    11/10/13 1821 Given   sulfamethoxazole-trimethoprim (BACTRIM DS) 800-160 MG per tablet 2 tablet 2 tablet    11/10/13 2221 Given   sulfamethoxazole-trimethoprim (BACTRIM DS) 800-160 MG per tablet 2 tablet 2 tablet    11/11/13 0551 Given   levofloxacin (LEVAQUIN) tablet 750 mg 750 mg    11/11/13 1006 Given   sulfamethoxazole-trimethoprim (BACTRIM DS) 800-160 MG per tablet 2 tablet 2 tablet    11/11/13 1501 Given   sulfamethoxazole-trimethoprim (BACTRIM DS) 800-160 MG per tablet 2 tablet 2 tablet    11/11/13 2310 Given    sulfamethoxazole-trimethoprim (BACTRIM DS) 800-160 MG per tablet 2 tablet 2 tablet    11/12/13 0915 Given   sulfamethoxazole-trimethoprim (BACTRIM DS) 800-160 MG per tablet 2 tablet 2 tablet       Principal Problem:   Pneumonia Active Problems:   Asthma   HIV antibody positive   Elevated liver enzymes   History of shingles   Obesity   AKI (acute kidney injury)    Time spent: 25 min    [facility] Pager [phone]. If 7PM-7AM, please contact night-coverage at [email or web address], password [facility] 11/12/2013, 4:24 PM  LOS: 5 days

## 2013-11-12 NOTE — Progress Notes (Addendum)
INFECTIOUS DISEASE PROGRESS NOTE  ID: Jim Wyatt is a 33 y.o. male with  Principal Problem:   Pneumonia Active Problems:   Asthma   HIV antibody positive   Elevated liver enzymes   History of shingles   Obesity   AKI (acute kidney injury)  Subjective: Without complaints  Abtx:  Anti-infectives   Start     Dose/Rate Route Frequency Ordered Stop   11/13/13 0600  levofloxacin (LEVAQUIN) tablet 750 mg     750 mg Oral Every 48 hours 11/11/13 1306     11/11/13 1600  sulfamethoxazole-trimethoprim (BACTRIM DS) 800-160 MG per tablet 2 tablet     2 tablet Oral 3 times daily 11/11/13 1011     11/11/13 0500  levofloxacin (LEVAQUIN) tablet 750 mg  Status:  Discontinued     750 mg Oral Every 24 hours 11/10/13 1042 11/11/13 1306   11/10/13 1200  sulfamethoxazole-trimethoprim (BACTRIM DS) 800-160 MG per tablet 2 tablet  Status:  Discontinued     2 tablet Oral 4 times daily 11/10/13 1100 11/11/13 1011   11/10/13 0200  sulfamethoxazole-trimethoprim (BACTRIM) 340 mg of trimethoprim in dextrose 5 % 500 mL IVPB  Status:  Discontinued     340 mg of trimethoprim 347.5 mL/hr over 90 Minutes Intravenous Every 6 hours 11/09/13 2056 11/10/13 1100   11/09/13 1800  sulfamethoxazole-trimethoprim (BACTRIM) 340 mg of trimethoprim in dextrose 5 % 500 mL IVPB  Status:  Discontinued     340 mg of trimethoprim 347.5 mL/hr over 90 Minutes Intravenous 4 times per day 11/09/13 1637 11/09/13 2056   11/07/13 0800  vancomycin (VANCOCIN) IVPB 1000 mg/200 mL premix  Status:  Discontinued     1,000 mg 200 mL/hr over 60 Minutes Intravenous Every 8 hours 11/07/13 0542 11/09/13 1603   11/07/13 0600  levofloxacin (LEVAQUIN) IVPB 750 mg  Status:  Discontinued     750 mg 100 mL/hr over 90 Minutes Intravenous Every 24 hours 11/07/13 0539 11/10/13 1042   11/07/13 0545  vancomycin (VANCOCIN) IVPB 1000 mg/200 mL premix  Status:  Discontinued     1,000 mg 200 mL/hr over 60 Minutes Intravenous  Once 11/07/13 0539 11/07/13  0557   11/07/13 0030  levofloxacin (LEVAQUIN) IVPB 750 mg     750 mg 100 mL/hr over 90 Minutes Intravenous  Once 11/07/13 0029 11/07/13 0235   11/07/13 0030  vancomycin (VANCOCIN) IVPB 1000 mg/200 mL premix     1,000 mg 200 mL/hr over 60 Minutes Intravenous  Once 11/07/13 0029 11/07/13 0405      Medications:  Scheduled: . [START ON 11/13/2013] levofloxacin  750 mg Oral Q48H  . nystatin  5 mL Oral QID  . pantoprazole  40 mg Oral Q1200  . sulfamethoxazole-trimethoprim  2 tablet Oral TID    Objective: Vital signs in last 24 hours: Temp:  [98.4 F (36.9 C)-99.9 F (37.7 C)] 98.7 F (37.1 C) (09/16 1300) Pulse Rate:  [85-96] 85 (09/16 1300) Resp:  [18-20] 18 (09/16 1300) BP: (125-127)/(66-74) 126/74 mmHg (09/16 1300) SpO2:  [98 %-100 %] 98 % (09/16 1300)   General appearance: alert and no distress Resp: clear to auscultation bilaterally Cardio: regular rate and rhythm GI: normal findings: bowel sounds normal and soft, non-tender  Lab Results  Recent Labs  11/11/13 0333 11/11/13 1108 11/12/13 0004  WBC 5.6  --  4.8  HGB 12.3*  --  11.9*  HCT 35.9*  --  35.2*  NA 129* 130* 129*  K 4.3 4.3 4.7  CL 94*  97 97  CO2 _0 BUN _1 CREATININE 2.55* 2.37* 2.35*   Liver Panel No results found for this basename: PROT, ALBUMIN, AST, ALT, ALKPHOS, BILITOT, BILIDIR, IBILI,  in the last 72 hours Sedimentation Rate No results found for this basename: ESRSEDRATE,  in the last 72 hours C-Reactive Protein No results found for this basename: CRP,  in the last 72 hours  Microbiology: Recent Results (from the past 240 hour(s))  CULTURE, BLOOD (ROUTINE X 2)     Status: None   Collection Time    11/07/13 12:23 AM      Result Value Ref Range Status   Specimen Description BLOOD RIGHT HAND   Final   Special Requests BOTTLES DRAWN AEROBIC AND ANAEROBIC 5CC   Final   Culture  Setup Time     Final   Value: 11/07/2013 04:48     Performed at [facility]   Culture      Final   Value:        BLOOD CULTURE RECEIVED NO GROWTH TO DATE CULTURE WILL BE HELD FOR 5 DAYS BEFORE ISSUING A FINAL NEGATIVE REPORT     Performed at [facility]   Report Status PENDING   Incomplete  CULTURE, BLOOD (ROUTINE X 2)     Status: None   Collection Time    11/07/13 12:28 AM      Result Value Ref Range Status   Specimen Description BLOOD LEFT ANTECUBITAL   Final   Special Requests BOTTLES DRAWN AEROBIC AND ANAEROBIC 10CC   Final   Culture  Setup Time     Final   Value: 11/07/2013 04:48     Performed at [facility]   Culture     Final   Value:        BLOOD CULTURE RECEIVED NO GROWTH TO DATE CULTURE WILL BE HELD FOR 5 DAYS BEFORE ISSUING A FINAL NEGATIVE REPORT     Performed at [facility]   Report Status PENDING   Incomplete  URINE CULTURE     Status: None   Collection Time    11/07/13  4:52 PM      Result Value Ref Range Status   Specimen Description URINE, RANDOM   Final   Special Requests NONE   Final   Culture  Setup Time     Final   Value: 11/07/2013 19:58     Performed at [facility]     Final   Value: NO GROWTH     Performed at [facility]   Culture     Final   Value: NO GROWTH     Performed at [facility]   Report Status 11/09/2013 FINAL   Final  GC/CHLAMYDIA PROBE AMP     Status: None   Collection Time    11/07/13  4:52 PM      Result Value Ref Range Status   CT Probe RNA NEGATIVE  NEGATIVE Final   GC Probe RNA NEGATIVE  NEGATIVE Final   Comment: (NOTE)                                                                                               **  Normal Reference Range: Negative**          Assay performed using the Gen-Probe APTIMA COMBO2 (R) Assay.     Acceptable specimen types for this assay include APTIMA Swabs (Unisex,     endocervical, urethral, or vaginal), first void urine, and ThinPrep     liquid based cytology samples.     Performed at [facility]  DIFFICILE BY PCR     Status: None   Collection Time    11/07/13 10:35 PM      Result Value Ref Range Status   C difficile by pcr NEGATIVE  NEGATIVE Final  RESPIRATORY VIRUS PANEL     Status: None   Collection Time    11/09/13  3:19 PM      Result Value Ref Range Status   Source - RVPAN NASAL SWAB   Corrected   Comment: CORRECTED ON 09/14 AT 2100: PREVIOUSLY REPORTED AS NASAL SWAB   Respiratory Syncytial Virus A NOT DETECTED   Final   Respiratory Syncytial Virus B NOT DETECTED   Final   Influenza A NOT DETECTED   Final   Influenza B NOT DETECTED   Final   Parainfluenza 1 NOT DETECTED   Final   Parainfluenza 2 NOT DETECTED   Final   Parainfluenza 3 NOT DETECTED   Final   Metapneumovirus NOT DETECTED   Final   Rhinovirus NOT DETECTED   Final   Adenovirus NOT DETECTED   Final   Influenza A H1 NOT DETECTED   Final   Influenza A H3 NOT DETECTED   Final   Comment: (NOTE)           Normal Reference Range for each Analyte: NOT DETECTED     Testing performed using the Luminex xTAG Respiratory Viral Panel test     kit.     The analytical performance characteristics of this assay have been     determined by [facility].  The modifications have not been     cleared or approved by the FDA. This assay has been validated pursuant     to the CLIA regulations and is used for clinical purposes.     Performed at [facility]    Studies/Results: US Renal  11/11/2013   CLINICAL DATA:  Acute renal injury, fever  EXAM: RENAL/URINARY TRACT ULTRASOUND COMPLETE  COMPARISON:  None.  FINDINGS: Right Kidney:  Length: 12.1 cm. Diffuse increased echogenicity of the right kidney is noted.  Left Kidney:  Length: 12.7 cm. Echogenicity within normal limits. No mass or hydronephrosis visualized.  Bladder:  Incompletely distended.  No gross abnormality is noted.  IMPRESSION: Hyperechoic right kidney. This may be related to a component of medical renal disease. No other focal abnormality is noted.    Electronically Signed   By: Inez Catalina M.D.   On: 11/11/2013 10:07     Assessment/Plan: AIDS  Pneumonia  ARF   Cr unchanged Will change bactrim to clinda, pyramethamine to see if we can improve his Cr quantiferon (-) Await his HIV genotype  May need to consider renal eval D/c planning  Total days of antibiotics levaquin (day 6), bactrim day [facility] (pager) [phone] [email or web address] 11/12/2013, 2:44 PM  LOS: 5 days

## 2013-11-13 LAB — CULTURE, BLOOD (ROUTINE X 2)
Culture: NO GROWTH
Culture: NO GROWTH

## 2013-11-13 LAB — CBC
HCT: 33.2 % — ABNORMAL LOW (ref 39.0–52.0)
Hemoglobin: 11.4 g/dL — ABNORMAL LOW (ref 13.0–17.0)
MCH: 26.9 pg (ref 26.0–34.0)
MCHC: 34.3 g/dL (ref 30.0–36.0)
MCV: 78.3 fL (ref 78.0–100.0)
Platelets: 117 10*3/uL — ABNORMAL LOW (ref 150–400)
RBC: 4.24 MIL/uL (ref 4.22–5.81)
RDW: 14.6 % (ref 11.5–15.5)
WBC: 5 10*3/uL (ref 4.0–10.5)

## 2013-11-13 LAB — BASIC METABOLIC PANEL
Anion gap: 14 (ref 5–15)
BUN: 19 mg/dL (ref 6–23)
CO2: 17 mEq/L — ABNORMAL LOW (ref 19–32)
Calcium: 7.6 mg/dL — ABNORMAL LOW (ref 8.4–10.5)
Chloride: 100 mEq/L (ref 96–112)
Creatinine, Ser: 2.44 mg/dL — ABNORMAL HIGH (ref 0.50–1.35)
GFR calc Af Amer: 39 mL/min — ABNORMAL LOW (ref >90)
GFR calc non Af Amer: 34 mL/min — ABNORMAL LOW (ref >90)
Glucose, Bld: 80 mg/dL (ref 70–99)
Potassium: 5.3 mEq/L (ref 3.7–5.3)
Sodium: 131 mEq/L — ABNORMAL LOW (ref 137–147)

## 2013-11-13 MED ORDER — AZITHROMYCIN 600 MG PO TABS
1200.0000 mg | ORAL_TABLET | ORAL | Status: DC
  Administered 2013-11-13: 1200 mg via ORAL
  Filled 2013-11-13 (×2): qty 2

## 2013-11-13 NOTE — Consult Note (Signed)
Reason for Consult: AKI  Referring Physician: Dr. Karleen Hampshire  HPI: Jim Wyatt is an 33 y.o. male with past medical history of asthma and newly diagnosed HIV/AIDS (CD4 109, VL 67K on 11/10/13) who were consulted for in regards to acute kidney injury.   Pt was admitted on 11/07/13 with left lower lobe HCAP due to previously failed antibiotic therapy with doxycyline  for CAP (ED admission on 9/6). On admission (9/11) he was given IV contrast for CT abdomen/pelvis imaging. He was also started on IV vancomycin (9/11-9/13) and levaquin (9/11-17) later put on bactrim DS (9/13-9/15) and since yesterday on clindamycin/primaquine (9/16) for suspected PCP pneumonia. He was not septic on admission or during hospitalization with final negative blood cultures and no episodes of recorded hypotension. He reports using OTC ibuprofen prior to admission for chest pain (reported taking 12 pills in 2 days) and also received 400-600 mg on 9/11-/14 during current hospitalization.   His creatine 07/30/10 on istat-chem8 panel was 1.4 and on admission was 0.86. Last normal Cr was was 0.98 on 9/12. Creatinine was not rechecked until 9/15 where it was found to be elevated at 2.55. He was started (and currently) on IV fluids (NS 125 mL/hr) ith improvement of creatinine to 2.35 on 9/16 and 2.44 today.    UA since 09/03/13 has revealed proteinuria (100 to >300) and hematuria (trace to large recently). Renal US on 11/11/13 revealed 12.1 cm right kidney with diffuse increased echogenicity and left 12.7 cm with normal echogenicity. CT abdomen w/cont on 9/11 was normal. Urinary sodium was 62 and urinary creatinine was 94.11 on 9/16 with calculated FeNa of 1.2%.   He denies personal history of kidney disease but reports his maternal grandmother was on HD for ESRD. He denies history of hematuria but reports having a UTI in July of this year that was treated. He denies fever, chills, rash, arthralgias, flank pain, LE edema, or urinary symptoms.  He had gross hematuria on 9/13 during hospitalization after receiving SQ heparin (9/11-13) with microcytic anemia since admission (Hg 11.4-12.5) with no prior history of anemia. He has also had acute thrombocytopenia since hospitalization (98K-117K) with history of thrombocytopenia in 2012 (148K).   He currently has nausea after starting clindamycin and tenderness to palpation of epigastric bulge with decreased PO intake for the past 2 days.      PMH:   Past Medical History  Diagnosis Date  . Asthma   . Pneumonia   . Shingles     PSH:  History reviewed. No pertinent past surgical history.  Allergies:  Allergies  Allergen Reactions  . Augmentin [Amoxicillin-Pot Clavulanate] Other (See Comments)    unknown  . Banana   . Cauliflower [Brassica Oleracea Italica] Other (See Comments)    Pt states his throat closes up  . Other     Patient has multiple food allergies    Medications:   Prior to Admission medications   Medication Sig Start Date End Date Taking? Authorizing Provider  albuterol (PROVENTIL HFA;VENTOLIN HFA) 108 (90 BASE) MCG/ACT inhaler Inhale 2 puffs into the lungs every 6 (six) hours as needed for wheezing or shortness of breath.   Yes Historical Provider, MD  ibuprofen (ADVIL,MOTRIN) 200 MG tablet Take 600 mg by mouth 2 (two) times daily as needed.   Yes Historical Provider, MD    Discontinued Meds:   Medications Discontinued During This Encounter  Medication Reason  . predniSONE (DELTASONE) 20 MG tablet Completed Course  . doxycycline (VIBRAMYCIN) 100 MG capsule Inpatient  Standard  . 0.9 %  sodium chloride infusion   . vancomycin (VANCOCIN) IVPB 1000 mg/200 mL premix   . vancomycin (VANCOCIN) IVPB 1000 mg/200 mL premix   . sulfamethoxazole-trimethoprim (BACTRIM) 340 mg of trimethoprim in dextrose 5 % 500 mL IVPB   . ondansetron (ZOFRAN) injection 4 mg   . levofloxacin (LEVAQUIN) IVPB 750 mg   . sulfamethoxazole-trimethoprim (BACTRIM) 340 mg of trimethoprim in  dextrose 5 % 500 mL IVPB   . ibuprofen (ADVIL,MOTRIN) tablet 600 mg   . sulfamethoxazole-trimethoprim (BACTRIM DS) 800-160 MG per tablet 2 tablet   . levofloxacin (LEVAQUIN) tablet 750 mg   . ibuprofen (ADVIL,MOTRIN) tablet 600 mg   . heparin injection 5,000 Units   . sulfamethoxazole-trimethoprim (BACTRIM DS) 800-160 MG per tablet 2 tablet       Family History:  No family history on file.  Social History:  reports that he has never smoked. He does not have any smokeless tobacco history on file. He reports that he does not drink alcohol or use illicit drugs. Review of Systems  Constitutional: Negative for fever and chills.       Decreased PO intake   HENT: Negative for sore throat.   Respiratory: Positive for cough (improved). Negative for hemoptysis, sputum production, shortness of breath and wheezing.   Cardiovascular: Negative for chest pain and leg swelling.  Gastrointestinal: Positive for nausea, vomiting and abdominal pain (epigastric ). Negative for diarrhea, constipation and blood in stool.  Genitourinary: Positive for hematuria (resolved). Negative for dysuria, urgency, frequency and flank pain.  Musculoskeletal: Negative for joint pain.  Skin: Negative for rash.  Neurological: Positive for headaches. Negative for dizziness.      Blood pressure 119/71, pulse 86, temperature 99.4 F (37.4 C), temperature source Oral, resp. rate 19, height '5\' 7"'  (1.702 m), weight 208 lb 3.2 oz (94.439 kg), SpO2 95.00%.  Physical Exam  Constitutional: He is oriented to person, place, and time. He appears well-developed and well-nourished. No distress.  HENT:  Head: Normocephalic and atraumatic.  Right Ear: External ear normal.  Left Ear: External ear normal.  Nose: Nose normal.  Mouth/Throat: Oropharynx is clear and moist. No oropharyngeal exudate.  Eyes: EOM are normal.  Neck: Normal range of motion. Neck supple.  Cardiovascular: Normal rate and regular rhythm.   Respiratory: Effort  normal and breath sounds normal. No respiratory distress. He has no wheezes. He has no rales.  GI: Soft. Bowel sounds are normal. He exhibits no distension. There is tenderness (At site of buldge (epigastric)). There is no rebound and no guarding.  Appears to be ventral hernia, unable to assess if reproducible   Musculoskeletal: Normal range of motion. He exhibits no edema and no tenderness.  Neurological: He is alert and oriented to person, place, and time.  Skin: Skin is warm and dry. No rash noted. He is not diaphoretic. No erythema. No pallor.  Psychiatric: He has a normal mood and affect. His behavior is normal. Judgment and thought content normal.  Neck A&P cervical nodes CV GR2/6 M,  Abdm Liver down 4 cm, pos bs, soft, firm ? Midline hernia 5 cm above UMB  Creatinine, Ser  Date/Time Value Ref Range Status  11/13/2013  4:00 AM 2.44* 0.50 - 1.35 mg/dL Final  11/12/2013 12:04 AM 2.35* 0.50 - 1.35 mg/dL Final  11/11/2013 11:08 AM 2.37* 0.50 - 1.35 mg/dL Final  11/11/2013  3:33 AM 2.55* 0.50 - 1.35 mg/dL Final  11/08/2013  1:35 AM 0.98  0.50 - 1.35 mg/dL Final  11/07/2013  8:55 AM 0.96  0.50 - 1.35 mg/dL Final  11/07/2013 12:23 AM 0.86  0.50 - 1.35 mg/dL Final  09/03/2013  4:21 AM 1.00  0.50 - 1.35 mg/dL Final  07/30/2010  4:37 PM 1.40  0.4 - 1.5 mg/dL Final    Results for orders placed during the hospital encounter of 11/07/13 (from the past 48 hour(s))  CBC     Status: Abnormal   Collection Time    11/12/13 12:04 AM      Result Value Ref Range   WBC 4.8  4.0 - 10.5 K/uL   RBC 4.48  4.22 - 5.81 MIL/uL   Hemoglobin 11.9 (*) 13.0 - 17.0 g/dL   HCT 35.2 (*) 39.0 - 52.0 %   MCV 78.6  78.0 - 100.0 fL   MCH 26.6  26.0 - 34.0 pg   MCHC 33.8  30.0 - 36.0 g/dL   RDW 14.1  11.5 - 15.5 %   Platelets 98 (*) 150 - 400 K/uL   Comment: CONSISTENT WITH PREVIOUS RESULT  BASIC METABOLIC PANEL     Status: Abnormal   Collection Time    11/12/13 12:04 AM      Result Value Ref Range   Sodium 129 (*) 137  - 147 mEq/L   Potassium 4.7  3.7 - 5.3 mEq/L   Chloride 97  96 - 112 mEq/L   CO2 19  19 - 32 mEq/L   Glucose, Bld 81  70 - 99 mg/dL   BUN 21  6 - 23 mg/dL   Creatinine, Ser 2.35 (*) 0.50 - 1.35 mg/dL   Calcium 7.7 (*) 8.4 - 10.5 mg/dL   GFR calc non Af Amer 35 (*) >90 mL/min   GFR calc Af Amer 41 (*) >90 mL/min   Comment: (NOTE)     The eGFR has been calculated using the CKD EPI equation.     This calculation has not been validated in all clinical situations.     eGFR's persistently <90 mL/min signify possible Chronic Kidney     Disease.   Anion gap 13  5 - 15  SODIUM, URINE, RANDOM     Status: None   Collection Time    11/12/13  4:33 PM      Result Value Ref Range   Sodium, Ur 62    CREATININE, URINE, RANDOM     Status: None   Collection Time    11/12/13  4:33 PM      Result Value Ref Range   Creatinine, Urine 94.11    URINALYSIS, ROUTINE W REFLEX MICROSCOPIC     Status: Abnormal   Collection Time    11/12/13  4:33 PM      Result Value Ref Range   Color, Urine YELLOW  YELLOW   APPearance CLEAR  CLEAR   Specific Gravity, Urine 1.014  1.005 - 1.030   pH 7.0  5.0 - 8.0   Glucose, UA NEGATIVE  NEGATIVE mg/dL   Hgb urine dipstick MODERATE (*) NEGATIVE   Bilirubin Urine NEGATIVE  NEGATIVE   Ketones, ur NEGATIVE  NEGATIVE mg/dL   Protein, ur 100 (*) NEGATIVE mg/dL   Urobilinogen, UA 1.0  0.0 - 1.0 mg/dL   Nitrite NEGATIVE  NEGATIVE   Leukocytes, UA NEGATIVE  NEGATIVE  URINE MICROSCOPIC-ADD ON     Status: None   Collection Time    11/12/13  4:33 PM      Result Value Ref Range   Squamous Epithelial / LPF RARE  RARE  WBC, UA 0-2  <3 WBC/hpf   RBC / HPF 3-6  <3 RBC/hpf   Bacteria, UA RARE  RARE  BASIC METABOLIC PANEL     Status: Abnormal   Collection Time    11/13/13  4:00 AM      Result Value Ref Range   Sodium 131 (*) 137 - 147 mEq/L   Potassium 5.3  3.7 - 5.3 mEq/L   Chloride 100  96 - 112 mEq/L   CO2 17 (*) 19 - 32 mEq/L   Glucose, Bld 80  70 - 99 mg/dL   BUN 19   6 - 23 mg/dL   Creatinine, Ser 2.44 (*) 0.50 - 1.35 mg/dL   Calcium 7.6 (*) 8.4 - 10.5 mg/dL   GFR calc non Af Amer 34 (*) >90 mL/min   GFR calc Af Amer 39 (*) >90 mL/min   Comment: (NOTE)     The eGFR has been calculated using the CKD EPI equation.     This calculation has not been validated in all clinical situations.     eGFR's persistently <90 mL/min signify possible Chronic Kidney     Disease.   Anion gap 14  5 - 15  CBC     Status: Abnormal   Collection Time    11/13/13 11:30 AM      Result Value Ref Range   WBC 5.0  4.0 - 10.5 K/uL   RBC 4.24  4.22 - 5.81 MIL/uL   Hemoglobin 11.4 (*) 13.0 - 17.0 g/dL   HCT 33.2 (*) 39.0 - 52.0 %   MCV 78.3  78.0 - 100.0 fL   MCH 26.9  26.0 - 34.0 pg   MCHC 34.3  30.0 - 36.0 g/dL   RDW 14.6  11.5 - 15.5 %   Platelets 117 (*) 150 - 400 K/uL   Comment: SPECIMEN CHECKED FOR CLOTS     CONSISTENT WITH PREVIOUS RESULT    No results found.   Assessment: Mr Guidroz is 33 yo man with pmh of asthma and newly diagnosed HIV/AIDS (CD4 40, VL 67K on 11/10/13) admitted on 9/11 for suspected HCAP most likely with PCP PNA who we are consulted for in regards to AKI with creatinine peak of 2.55.     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 No sepsis or hypotension suggesting ischemia. AIN unlikely as well with no fever, rash, or arthralgias, however would obtain CBC w/diff to assess for peripheral eosinophilia. FeNa 1.2% consistent with ATN (however typically <1% in CIN). Pt responsive to IV fluids, would continue at current rate. 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 Would also obtain CK level.  Monitor renal function panel daily, daily weights, strict I/O's, and avoid nephrotoxins (especially NSAIDS, sulfa drugs).         MOST LIKELY BACTRIM OR NSAID WITH BACTRIM BEING PRIME CANDIDATE.  HAS INTERSTITIAL PATTERN OF E'lyte/K.  LESS LIKELY CONTRAST.  WOULD AVOID SULFA FROM NOW ON. SHOULD RESOLVE SPONT, BUT IF HANGS ON BX AND ? STEROIDS Newly Diagnosed  HIV Infection/AIDS - Most likely since at least 2012 (lympocytopenia on 07/30/10 CBC)  ID following. Plan to start HAART once genotype known.  HCAP vs Possible PCP Pneumonia - Elevated LDH 541. Currently on clindamycin and primaquine. ID following. No hypoxia requiring corticosteroid use. Anion Gap Metabolic Acidosis - AG 14 with bicarbonate 17 in setting of AKI. Continue to monitor. May need bicarbonate if no improvement.  PART OF AIN TYPE PICTURE WITH RTA. Hyponatremia - Na 135 on admission (9/11), today 131. No significant response with IV fluids. Obtain serum osmolarity.   Hyperkalemia -  K 5.3 today. Most likely due to acidosis from AKI. Continue to monitor daily.  Acute Microcytic Anemia - Pt with no prior history of anemia with recent hematuria thought to be due to SQ heparin in setting of thrombocytopenia. Continue holding heparin. With elevated LDH and bactrim use hemolytic anemia in setting of possible G6PD deficiency possible. Obtain G6PD and checking hemolytic anemia labs .NEED TO CHECK G6PD Acute Thrombocytopenia - Platelet count 117K today. Most likely due to recent bactrim use vs heparin use.  VS G6PD Elevated AST/ALT - Hepatitis panel negative. No reported alcohol use. Normal appearing liver on CT abdomen. Etiology possibly due to HIV infection vs NAFLD.   RABBANI, MARJAN 11/13/2013, 2:13 PM

## 2013-11-13 NOTE — Progress Notes (Signed)
ANTIBIOTIC CONSULT NOTE - FOLLOW UP  Pharmacy Consult: azithromycin Indication:  Rule out PCP PNA  Allergies  Allergen Reactions  . Augmentin [Amoxicillin-Pot Clavulanate] Other (See Comments)    unknown  . Banana   . Cauliflower [Brassica Oleracea Italica] Other (See Comments)    Pt states his throat closes up  . Other     Patient has multiple food allergies    Patient Measurements: Height: '5\' 7"'  (170.2 cm) Weight: 208 lb 3.2 oz (94.439 kg) IBW/kg (Calculated) : 66.1 Adjusted Body Weight: 77 kg  Vital Signs: Temp: 99.4 F (37.4 C) (09/17 0428) Temp src: Oral (09/17 0428) BP: 119/71 mmHg (09/17 0428) Pulse Rate: 86 (09/17 0428) Intake/Output from previous day: 09/16 0701 - 09/17 0700 In: 1610 [P.O.:240; I.V.:1250] Out: -   Labs:  Recent Labs  11/11/13 0333 11/11/13 1108 11/12/13 0004 11/12/13 1633 11/13/13 0400  WBC 5.6  --  4.8  --   --   HGB 12.3*  --  11.9*  --   --   PLT 104*  --  98*  --   --   LABCREA  --   --   --  94.11  --   CREATININE 2.55* 2.37* 2.35*  --  2.44*   Estimated Creatinine Clearance: 48.5 ml/min (by C-G formula based on Cr of 2.44). No results found for this basename: VANCOTROUGH, VANCOPEAK, VANCORANDOM, [place], GENTPEAK, GENTRANDOM, [place], TOBRAPEAK, TOBRARND, AMIKACINPEAK, AMIKACINTROU, AMIKACIN,  in the last 72 hours   Microbiology: Recent Results (from the past 720 hour(s))  CULTURE, BLOOD (ROUTINE X 2)     Status: None   Collection Time    11/07/13 12:23 AM      Result Value Ref Range Status   Specimen Description BLOOD RIGHT HAND   Final   Special Requests BOTTLES DRAWN AEROBIC AND ANAEROBIC 5CC   Final   Culture  Setup Time     Final   Value: 11/07/2013 04:48     Performed at [facility]   Culture     Final   Value:        BLOOD CULTURE RECEIVED NO GROWTH TO DATE CULTURE WILL BE HELD FOR 5 DAYS BEFORE ISSUING A FINAL NEGATIVE REPORT     Performed at [facility]   Report Status PENDING    Incomplete  CULTURE, BLOOD (ROUTINE X 2)     Status: None   Collection Time    11/07/13 12:28 AM      Result Value Ref Range Status   Specimen Description BLOOD LEFT ANTECUBITAL   Final   Special Requests BOTTLES DRAWN AEROBIC AND ANAEROBIC 10CC   Final   Culture  Setup Time     Final   Value: 11/07/2013 04:48     Performed at [facility]   Culture     Final   Value:        BLOOD CULTURE RECEIVED NO GROWTH TO DATE CULTURE WILL BE HELD FOR 5 DAYS BEFORE ISSUING A FINAL NEGATIVE REPORT     Performed at [facility]   Report Status PENDING   Incomplete  URINE CULTURE     Status: None   Collection Time    11/07/13  4:52 PM      Result Value Ref Range Status   Specimen Description URINE, RANDOM   Final   Special Requests NONE   Final   Culture  Setup Time     Final   Value: 11/07/2013 19:58  Performed at [place]     Final   Value: NO GROWTH     Performed at [facility]   Culture     Final   Value: NO GROWTH     Performed at [facility]   Report Status 11/09/2013 FINAL   Final  GC/CHLAMYDIA PROBE AMP     Status: None   Collection Time    11/07/13  4:52 PM      Result Value Ref Range Status   CT Probe RNA NEGATIVE  NEGATIVE Final   GC Probe RNA NEGATIVE  NEGATIVE Final   Comment: (NOTE)                                                                                               **Normal Reference Range: Negative**          Assay performed using the Gen-Probe APTIMA COMBO2 (R) Assay.     Acceptable specimen types for this assay include APTIMA Swabs (Unisex,     endocervical, urethral, or vaginal), first void urine, and ThinPrep     liquid based cytology samples.     Performed at [facility] DIFFICILE BY PCR     Status: None   Collection Time    11/07/13 10:35 PM      Result Value Ref Range Status   C difficile by pcr NEGATIVE  NEGATIVE Final  RESPIRATORY VIRUS PANEL     Status: None    Collection Time    11/09/13  3:19 PM      Result Value Ref Range Status   Source - RVPAN NASAL SWAB   Corrected   Comment: CORRECTED ON 09/14 AT 2100: PREVIOUSLY REPORTED AS NASAL SWAB   Respiratory Syncytial Virus A NOT DETECTED   Final   Respiratory Syncytial Virus B NOT DETECTED   Final   Influenza A NOT DETECTED   Final   Influenza B NOT DETECTED   Final   Parainfluenza 1 NOT DETECTED   Final   Parainfluenza 2 NOT DETECTED   Final   Parainfluenza 3 NOT DETECTED   Final   Metapneumovirus NOT DETECTED   Final   Rhinovirus NOT DETECTED   Final   Adenovirus NOT DETECTED   Final   Influenza A H1 NOT DETECTED   Final   Influenza A H3 NOT DETECTED   Final   Comment: (NOTE)           Normal Reference Range for each Analyte: NOT DETECTED     Testing performed using the Luminex xTAG Respiratory Viral Panel test     kit.     The analytical performance characteristics of this assay have been     determined by [facility].  The modifications have not been     cleared or approved by the FDA. This assay has been validated pursuant     to the CLIA regulations and is used for clinical purposes.     Performed at [place]: 38 YOM who was admitted on  11/07/13 for worsening PNA despite outpatient treatment with doxycycline. The patient has been noted to have persistent fevers and lacking improvement in infection. Newly dx HIV with a CD4 54. Has been on treatment for PCP. Renal function has been an issue. D/w ID and will put on weekly azith for MAC prophylaxis.    Plan:   Azithromycin 1255m PO weekly  MOnnie Boer PharmD Pager: [phone]/17/2015 9:54 AM

## 2013-11-13 NOTE — Progress Notes (Addendum)
INFECTIOUS DISEASE PROGRESS NOTE  ID: Jim Wyatt is a 33 y.o. male with  Principal Problem:   Pneumonia Active Problems:   Asthma   HIV antibody positive   Elevated liver enzymes   History of shingles   Obesity   AKI (acute kidney injury)  Subjective: n/v  Abtx:  Anti-infectives   Start     Dose/Rate Route Frequency Ordered Stop   11/13/13 1100  azithromycin (ZITHROMAX) tablet 1,200 mg     1,200 mg Oral Weekly 11/13/13 0950     11/13/13 0600  levofloxacin (LEVAQUIN) tablet 750 mg     750 mg Oral Every 48 hours 11/11/13 1306     11/12/13 1800  clindamycin (CLEOCIN) capsule 300 mg     300 mg Oral 4 times per day 11/12/13 1458 11/29/13 1759   11/12/13 1600  primaquine tablet 30 mg     30 mg Oral Daily 11/12/13 1458 11/29/13 0959   11/11/13 1600  sulfamethoxazole-trimethoprim (BACTRIM DS) 800-160 MG per tablet 2 tablet  Status:  Discontinued     2 tablet Oral 3 times daily 11/11/13 1011 11/12/13 1458   11/11/13 0500  levofloxacin (LEVAQUIN) tablet 750 mg  Status:  Discontinued     750 mg Oral Every 24 hours 11/10/13 1042 11/11/13 1306   11/10/13 1200  sulfamethoxazole-trimethoprim (BACTRIM DS) 800-160 MG per tablet 2 tablet  Status:  Discontinued     2 tablet Oral 4 times daily 11/10/13 1100 11/11/13 1011   11/10/13 0200  sulfamethoxazole-trimethoprim (BACTRIM) 340 mg of trimethoprim in dextrose 5 % 500 mL IVPB  Status:  Discontinued     340 mg of trimethoprim 347.5 mL/hr over 90 Minutes Intravenous Every 6 hours 11/09/13 2056 11/10/13 1100   11/09/13 1800  sulfamethoxazole-trimethoprim (BACTRIM) 340 mg of trimethoprim in dextrose 5 % 500 mL IVPB  Status:  Discontinued     340 mg of trimethoprim 347.5 mL/hr over 90 Minutes Intravenous 4 times per day 11/09/13 1637 11/09/13 2056   11/07/13 0800  vancomycin (VANCOCIN) IVPB 1000 mg/200 mL premix  Status:  Discontinued     1,000 mg 200 mL/hr over 60 Minutes Intravenous Every 8 hours 11/07/13 0542 11/09/13 1603   11/07/13  0600  levofloxacin (LEVAQUIN) IVPB 750 mg  Status:  Discontinued     750 mg 100 mL/hr over 90 Minutes Intravenous Every 24 hours 11/07/13 0539 11/10/13 1042   11/07/13 0545  vancomycin (VANCOCIN) IVPB 1000 mg/200 mL premix  Status:  Discontinued     1,000 mg 200 mL/hr over 60 Minutes Intravenous  Once 11/07/13 0539 11/07/13 0557   11/07/13 0030  levofloxacin (LEVAQUIN) IVPB 750 mg     750 mg 100 mL/hr over 90 Minutes Intravenous  Once 11/07/13 0029 11/07/13 0235   11/07/13 0030  vancomycin (VANCOCIN) IVPB 1000 mg/200 mL premix     1,000 mg 200 mL/hr over 60 Minutes Intravenous  Once 11/07/13 0029 11/07/13 0405      Medications:  Scheduled: . azithromycin  1,200 mg Oral Weekly  . clindamycin  300 mg Oral 4 times per day  . cyclobenzaprine  10 mg Oral Once  . levofloxacin  750 mg Oral Q48H  . nystatin  5 mL Oral QID  . pantoprazole  40 mg Oral Q1200  . primaquine  30 mg Oral Daily    Objective: Vital signs in last 24 hours: Temp:  [98.6 F (37 C)-99.4 F (37.4 C)] 99.4 F (37.4 C) (09/17 0428) Pulse Rate:  [85-89] 86 (09/17 0428)  Resp:  [18-19] 19 (09/17 0428) BP: (119-129)/(71-74) 119/71 mmHg (09/17 0428) SpO2:  [95 %-98 %] 95 % (09/17 0428)   General appearance: mild distress Resp: clear to auscultation bilaterally Cardio: regular rate and rhythm GI: normal findings: bowel sounds normal and soft, non-tender  Lab Results  Recent Labs  11/11/13 0333  11/12/13 0004 11/13/13 0400  WBC 5.6  --  4.8  --   HGB 12.3*  --  11.9*  --   HCT 35.9*  --  35.2*  --   NA 129*  < > 129* 131*  K 4.3  < > 4.7 5.3  CL 94*  < > 97 100  CO2 20  < > 19 17*  BUN 20  < > 21 19  CREATININE 2.55*  < > 2.35* 2.44*  < > = values in this interval not displayed. Liver Panel No results found for this basename: PROT, ALBUMIN, AST, ALT, ALKPHOS, BILITOT, BILIDIR, IBILI,  in the last 72 hours Sedimentation Rate No results found for this basename: ESRSEDRATE,  in the last 72  hours C-Reactive Protein No results found for this basename: CRP,  in the last 72 hours  Microbiology: Recent Results (from the past 240 hour(s))  CULTURE, BLOOD (ROUTINE X 2)     Status: None   Collection Time    11/07/13 12:23 AM      Result Value Ref Range Status   Specimen Description BLOOD RIGHT HAND   Final   Special Requests BOTTLES DRAWN AEROBIC AND ANAEROBIC 5CC   Final   Culture  Setup Time     Final   Value: 11/07/2013 04:48     Performed at [facility]   Culture     Final   Value: NO GROWTH 5 DAYS     Performed at [facility]   Report Status 11/13/2013 FINAL   Final  CULTURE, BLOOD (ROUTINE X 2)     Status: None   Collection Time    11/07/13 12:28 AM      Result Value Ref Range Status   Specimen Description BLOOD LEFT ANTECUBITAL   Final   Special Requests BOTTLES DRAWN AEROBIC AND ANAEROBIC 10CC   Final   Culture  Setup Time     Final   Value: 11/07/2013 04:48     Performed at [facility]   Culture     Final   Value: NO GROWTH 5 DAYS     Performed at [facility]   Report Status 11/13/2013 FINAL   Final  URINE CULTURE     Status: None   Collection Time    11/07/13  4:52 PM      Result Value Ref Range Status   Specimen Description URINE, RANDOM   Final   Special Requests NONE   Final   Culture  Setup Time     Final   Value: 11/07/2013 19:58     Performed at [facility]     Final   Value: NO GROWTH     Performed at [facility]   Culture     Final   Value: NO GROWTH     Performed at [facility]   Report Status 11/09/2013 FINAL   Final  GC/CHLAMYDIA PROBE AMP     Status: None   Collection Time    11/07/13  4:52 PM      Result Value Ref Range Status   CT Probe RNA NEGATIVE  NEGATIVE Final   GC Probe RNA NEGATIVE  NEGATIVE Final   Comment: (NOTE)                                                                                               **Normal Reference Range: Negative**           Assay performed using the Gen-Probe APTIMA COMBO2 (R) Assay.     Acceptable specimen types for this assay include APTIMA Swabs (Unisex,     endocervical, urethral, or vaginal), first void urine, and ThinPrep     liquid based cytology samples.     Performed at [facility] DIFFICILE BY PCR     Status: None   Collection Time    11/07/13 10:35 PM      Result Value Ref Range Status   C difficile by pcr NEGATIVE  NEGATIVE Final  RESPIRATORY VIRUS PANEL     Status: None   Collection Time    11/09/13  3:19 PM      Result Value Ref Range Status   Source - RVPAN NASAL SWAB   Corrected   Comment: CORRECTED ON 09/14 AT 2100: PREVIOUSLY REPORTED AS NASAL SWAB   Respiratory Syncytial Virus A NOT DETECTED   Final   Respiratory Syncytial Virus B NOT DETECTED   Final   Influenza A NOT DETECTED   Final   Influenza B NOT DETECTED   Final   Parainfluenza 1 NOT DETECTED   Final   Parainfluenza 2 NOT DETECTED   Final   Parainfluenza 3 NOT DETECTED   Final   Metapneumovirus NOT DETECTED   Final   Rhinovirus NOT DETECTED   Final   Adenovirus NOT DETECTED   Final   Influenza A H1 NOT DETECTED   Final   Influenza A H3 NOT DETECTED   Final   Comment: (NOTE)           Normal Reference Range for each Analyte: NOT DETECTED     Testing performed using the Luminex xTAG Respiratory Viral Panel test     kit.     The analytical performance characteristics of this assay have been     determined by [facility].  The modifications have not been     cleared or approved by the FDA. This assay has been validated pursuant     to the CLIA regulations and is used for clinical purposes.     Performed at [facility]    Studies/Results: No results found.   Assessment/Plan: AIDS  Pneumonia  ARF   May need to change clinda/primaquine to atovaquone (vs azithro as cause of n/v).  Cr grossly unchanged  quantiferon (-)  Await his HIV genotype  Will get renal eval     Total days of antibiotics:  9/16 clinda/primaquine 9/11 levaquin 9/17 9/13 bactrim 9/16         Bobby Rumpf Infectious Diseases (pager) [phone] [email or web address] 11/13/2013, 11:57 AM  LOS: 6 days

## 2013-11-13 NOTE — Progress Notes (Signed)
PROGRESS NOTE  Jim Wyatt UKG:[id number] DOB: 1980/05/21 DOA: 11/07/2013 PCP: No PCP Per Patient  Jim Wyatt is a 33 y.o. male with a past medical history of asthma, and a recently treated community-acquired pneumonia, who presents with a chest pain, shortness of breath and abdominal pain.  The patient was seen on 9/5 and diagnosed with community-acquired pneumonia. He was discharged home with doxycycline. He has been compliant with medication. His condition has improved initially. But 2 days ago, he started having chest pain again. It is located in the front chest. It is aggravated by coughing and deep breath. He also has a fever and chills. He has mild cough with very little sputum production. He took ibuprofen for chest pain, 12 pills in the past 2 days. During the past 2 days, he used albuterol inhaler very frequently, approximately once every hour. He has been diagnosed with new HIV.  Seen by ID.  Developed AKI and is getting IVF  Assessment/Plan:   Healthcare associated pneumonia:  -CT chest and abdomen reveals left pulmonary infiltrates.  -blood culture x 2 NGTD -urine legionella  -S. pneumococcal antigen negative  -HIV positive -Levaquin  - bactrim added for PCP coverage, But has to be stopped for acute renal failure. Changed to clindamycin.   AKI Could be secondary to combination of bactrim, vancomycin and contrast and nsaids int he last few days.  Bactrim and vancomycin stopped. Hydration and stop all nephrotoxic medications.  Repeat level improving. US RENAL  No hydronephrosis. Urine studies ordered and pending. Urine analysis ordered and repeat BMP ordered for am. If persistently high will call renal consult for further recommendations.   HIV positive -Appreciate ID  abdominal pain and diarrhea: It is likely due to ibuprofen-induced gastritis Diarrhea resolved -C diff PCR neg -Protonix 40 mg daily  - Zofran for nausea  CT scan done  Asthma: stable. No  wheezing on lung auscultation  -Albuterol inhaler when necessary   Hematuria  -resolved -?UTI- culture pending- NGTD -no pain so doubt stone- just had CT scan of abd  Refusing heparin -SCDS and ambulation.   Code Status: full Family Communication: patient Disposition Plan: home soon   Consultants: Renal ID    HPI/Subjective: Feeling better HAD A good sleep last night.    Objective: Filed Vitals:   11/13/13 0428  BP: 119/71  Pulse: 86  Temp: 99.4 F (37.4 C)  Resp: 19    Intake/Output Summary (Last 24 hours) at 11/13/13 1705 Last data filed at 11/13/13 0749  Gross per 24 hour  Intake   1490 ml  Output      0 ml  Net   1490 ml   Filed Weights   11/07/13 0530  Weight: 94.439 kg (208 lb 3.2 oz)    Exam:   General:  A+Ox3, NAD  Cardiovascular: rrr  Respiratory: clear, no wheezing  Abdomen: +BS, soft  Musculoskeletal: no edema   Data Reviewed: Basic Metabolic Panel:  Recent Labs Lab 11/08/13 0135 11/11/13 0333 11/11/13 1108 11/12/13 0004 11/13/13 0400  NA 130* 129* 130* 129* 131*  K 3.8 4.3 4.3 4.7 5.3  CL 97 94* 97 97 100  CO2 '21 20 21 19 ' 17*  GLUCOSE 136* 79 91 81 80  BUN '11 20 21 21 19  ' CREATININE 0.98 2.55* 2.37* 2.35* 2.44*  CALCIUM 8.2* 8.0* 8.1* 7.7* 7.6*   Liver Function Tests:  Recent Labs Lab 11/07/13 0023 11/07/13 0855  AST 68* 49*  ALT 64* 51  ALKPHOS 55 52  BILITOT 0.5 0.6  PROT 7.8 7.0  ALBUMIN 2.5* 2.2*    Recent Labs Lab 11/07/13 0023  LIPASE 35   No results found for this basename: AMMONIA,  in the last 168 hours CBC:  Recent Labs Lab 11/07/13 0855 11/08/13 0135 11/11/13 0333 11/12/13 0004 11/13/13 1130  WBC 7.9 8.5 5.6 4.8 5.0  NEUTROABS 5.8  --   --   --   --   HGB 12.2* 12.5* 12.3* 11.9* 11.4*  HCT 35.9* 36.5* 35.9* 35.2* 33.2*  MCV 80.5 78.3 76.9* 78.6 78.3  PLT 152 155 104* 98* 117*   Cardiac Enzymes:  Recent Labs Lab 11/07/13 0855  TROPONINI <0.30   BNP (last 3  results)  Recent Labs  11/07/13 0855  PROBNP 33.5   CBG: No results found for this basename: GLUCAP,  in the last 168 hours  Recent Results (from the past 240 hour(s))  CULTURE, BLOOD (ROUTINE X 2)     Status: None   Collection Time    11/07/13 12:23 AM      Result Value Ref Range Status   Specimen Description BLOOD RIGHT HAND   Final   Special Requests BOTTLES DRAWN AEROBIC AND ANAEROBIC 5CC   Final   Culture  Setup Time     Final   Value: 11/07/2013 04:48     Performed at [facility]   Culture     Final   Value: NO GROWTH 5 DAYS     Performed at [facility]   Report Status 11/13/2013 FINAL   Final  CULTURE, BLOOD (ROUTINE X 2)     Status: None   Collection Time    11/07/13 12:28 AM      Result Value Ref Range Status   Specimen Description BLOOD LEFT ANTECUBITAL   Final   Special Requests BOTTLES DRAWN AEROBIC AND ANAEROBIC 10CC   Final   Culture  Setup Time     Final   Value: 11/07/2013 04:48     Performed at [facility]   Culture     Final   Value: NO GROWTH 5 DAYS     Performed at [facility]   Report Status 11/13/2013 FINAL   Final  URINE CULTURE     Status: None   Collection Time    11/07/13  4:52 PM      Result Value Ref Range Status   Specimen Description URINE, RANDOM   Final   Special Requests NONE   Final   Culture  Setup Time     Final   Value: 11/07/2013 19:58     Performed at [facility]     Final   Value: NO GROWTH     Performed at [facility]   Culture     Final   Value: NO GROWTH     Performed at [facility]   Report Status 11/09/2013 FINAL   Final  GC/CHLAMYDIA PROBE AMP     Status: None   Collection Time    11/07/13  4:52 PM      Result Value Ref Range Status   CT Probe RNA NEGATIVE  NEGATIVE Final   GC Probe RNA NEGATIVE  NEGATIVE Final   Comment: (NOTE)                                                                                               **  Normal  Reference Range: Negative**          Assay performed using the Gen-Probe APTIMA COMBO2 (R) Assay.     Acceptable specimen types for this assay include APTIMA Swabs (Unisex,     endocervical, urethral, or vaginal), first void urine, and ThinPrep     liquid based cytology samples.     Performed at [facility] DIFFICILE BY PCR     Status: None   Collection Time    11/07/13 10:35 PM      Result Value Ref Range Status   C difficile by pcr NEGATIVE  NEGATIVE Final  RESPIRATORY VIRUS PANEL     Status: None   Collection Time    11/09/13  3:19 PM      Result Value Ref Range Status   Source - RVPAN NASAL SWAB   Corrected   Comment: CORRECTED ON 09/14 AT 2100: PREVIOUSLY REPORTED AS NASAL SWAB   Respiratory Syncytial Virus A NOT DETECTED   Final   Respiratory Syncytial Virus B NOT DETECTED   Final   Influenza A NOT DETECTED   Final   Influenza B NOT DETECTED   Final   Parainfluenza 1 NOT DETECTED   Final   Parainfluenza 2 NOT DETECTED   Final   Parainfluenza 3 NOT DETECTED   Final   Metapneumovirus NOT DETECTED   Final   Rhinovirus NOT DETECTED   Final   Adenovirus NOT DETECTED   Final   Influenza A H1 NOT DETECTED   Final   Influenza A H3 NOT DETECTED   Final   Comment: (NOTE)           Normal Reference Range for each Analyte: NOT DETECTED     Testing performed using the Luminex xTAG Respiratory Viral Panel test     kit.     The analytical performance characteristics of this assay have been     determined by [facility].  The modifications have not been     cleared or approved by the FDA. This assay has been validated pursuant     to the CLIA regulations and is used for clinical purposes.     Performed at [facility]     Studies: No results found.  Scheduled Meds: . azithromycin  1,200 mg Oral Weekly  . clindamycin  300 mg Oral 4 times per day  . cyclobenzaprine  10 mg Oral Once  . levofloxacin  750 mg Oral Q48H  . nystatin  5 mL Oral QID   . pantoprazole  40 mg Oral Q1200  . primaquine  30 mg Oral Daily   Continuous Infusions: . sodium chloride 125 mL/hr at 11/12/13 2158   Antibiotics Given (last 72 hours)   Date/Time Action Medication Dose   11/10/13 1821 Given   sulfamethoxazole-trimethoprim (BACTRIM DS) 800-160 MG per tablet 2 tablet 2 tablet   11/10/13 2221 Given   sulfamethoxazole-trimethoprim (BACTRIM DS) 800-160 MG per tablet 2 tablet 2 tablet   11/11/13 0551 Given   levofloxacin (LEVAQUIN) tablet 750 mg 750 mg   11/11/13 1006 Given   sulfamethoxazole-trimethoprim (BACTRIM DS) 800-160 MG per tablet 2 tablet 2 tablet   11/11/13 1501 Given   sulfamethoxazole-trimethoprim (BACTRIM DS) 800-160 MG per tablet 2 tablet 2 tablet   11/11/13 2310 Given   sulfamethoxazole-trimethoprim (BACTRIM DS) 800-160 MG per tablet 2 tablet 2 tablet   11/12/13 0915 Given   sulfamethoxazole-trimethoprim (BACTRIM DS) 800-160 MG per tablet 2 tablet 2 tablet  11/12/13 1701 Given   clindamycin (CLEOCIN) capsule 300 mg 300 mg   11/12/13 1701 Given   primaquine tablet 30 mg 30 mg   11/12/13 2350 Given   clindamycin (CLEOCIN) capsule 300 mg 300 mg   11/13/13 0525 Given   levofloxacin (LEVAQUIN) tablet 750 mg 750 mg   11/13/13 0525 Given   clindamycin (CLEOCIN) capsule 300 mg 300 mg   11/13/13 7158 Given   primaquine tablet 30 mg 30 mg   11/13/13 1113 Given   clindamycin (CLEOCIN) capsule 300 mg 300 mg   11/13/13 1113 Given   azithromycin (ZITHROMAX) tablet 1,200 mg 1,200 mg      Principal Problem:   Pneumonia Active Problems:   Asthma   HIV antibody positive   Elevated liver enzymes   History of shingles   Obesity   AKI (acute kidney injury)    Time spent: 25 min    [facility] Pager [phone]. If 7PM-7AM, please contact night-coverage at [email or web address], password [email or web address] 11/13/2013, 5:05 PM  LOS: 6 days

## 2013-11-14 ENCOUNTER — Inpatient Hospital Stay (HOSPITAL_COMMUNITY): Payer: Self-pay

## 2013-11-14 DIAGNOSIS — R509 Fever, unspecified: Secondary | ICD-10-CM

## 2013-11-14 DIAGNOSIS — B20 Human immunodeficiency virus [HIV] disease: Secondary | ICD-10-CM | POA: Diagnosis present

## 2013-11-14 DIAGNOSIS — R079 Chest pain, unspecified: Secondary | ICD-10-CM

## 2013-11-14 DIAGNOSIS — R0602 Shortness of breath: Secondary | ICD-10-CM

## 2013-11-14 LAB — CBC WITH DIFFERENTIAL/PLATELET
Basophils Absolute: 0.1 10*3/uL (ref 0.0–0.1)
Basophils Relative: 1 % (ref 0–1)
Eosinophils Absolute: 0 10*3/uL (ref 0.0–0.7)
Eosinophils Relative: 0 % (ref 0–5)
HCT: 32.9 % — ABNORMAL LOW (ref 39.0–52.0)
Hemoglobin: 11.1 g/dL — ABNORMAL LOW (ref 13.0–17.0)
Lymphocytes Relative: 38 % (ref 12–46)
Lymphs Abs: 2.2 10*3/uL (ref 0.7–4.0)
MCH: 25.8 pg — ABNORMAL LOW (ref 26.0–34.0)
MCHC: 33.7 g/dL (ref 30.0–36.0)
MCV: 76.5 fL — ABNORMAL LOW (ref 78.0–100.0)
Monocytes Absolute: 0.3 10*3/uL (ref 0.1–1.0)
Monocytes Relative: 6 % (ref 3–12)
Neutro Abs: 3.2 10*3/uL (ref 1.7–7.7)
Neutrophils Relative %: 55 % (ref 43–77)
Platelets: 136 10*3/uL — ABNORMAL LOW (ref 150–400)
RBC: 4.3 MIL/uL (ref 4.22–5.81)
RDW: 14.4 % (ref 11.5–15.5)
WBC: 5.9 10*3/uL (ref 4.0–10.5)

## 2013-11-14 LAB — HAPTOGLOBIN: Haptoglobin: 284 mg/dL — ABNORMAL HIGH (ref 45–215)

## 2013-11-14 LAB — RENAL FUNCTION PANEL
Albumin: 1.6 g/dL — ABNORMAL LOW (ref 3.5–5.2)
Anion gap: 13 (ref 5–15)
BUN: 21 mg/dL (ref 6–23)
CO2: 17 mEq/L — ABNORMAL LOW (ref 19–32)
Calcium: 7.5 mg/dL — ABNORMAL LOW (ref 8.4–10.5)
Chloride: 98 mEq/L (ref 96–112)
Creatinine, Ser: 2.6 mg/dL — ABNORMAL HIGH (ref 0.50–1.35)
GFR calc Af Amer: 36 mL/min — ABNORMAL LOW (ref >90)
GFR calc non Af Amer: 31 mL/min — ABNORMAL LOW (ref >90)
Glucose, Bld: 86 mg/dL (ref 70–99)
Phosphorus: 2 mg/dL — ABNORMAL LOW (ref 2.3–4.6)
Potassium: 5.2 mEq/L (ref 3.7–5.3)
Sodium: 128 mEq/L — ABNORMAL LOW (ref 137–147)

## 2013-11-14 LAB — LACTATE DEHYDROGENASE: LDH: 697 U/L — ABNORMAL HIGH (ref 94–250)

## 2013-11-14 LAB — RETICULOCYTES
RBC.: 4.3 MIL/uL (ref 4.22–5.81)
Retic Count, Absolute: 34.4 10*3/uL (ref 19.0–186.0)
Retic Ct Pct: 0.8 % (ref 0.4–3.1)

## 2013-11-14 LAB — CK: Total CK: 328 U/L — ABNORMAL HIGH (ref 7–232)

## 2013-11-14 LAB — OSMOLALITY: Osmolality: 267 mOsm/kg — ABNORMAL LOW (ref 275–300)

## 2013-11-14 LAB — DIRECT ANTIGLOBULIN TEST (NOT AT ARMC)
DAT, IgG: NEGATIVE
DAT, complement: NEGATIVE

## 2013-11-14 LAB — BILIRUBIN, TOTAL: Total Bilirubin: 0.4 mg/dL (ref 0.3–1.2)

## 2013-11-14 LAB — PROTEIN / CREATININE RATIO, URINE
Creatinine, Urine: 91.68 mg/dL
Protein Creatinine Ratio: 16.08 — ABNORMAL HIGH (ref 0.00–0.15)
Total Protein, Urine: 1474 mg/dL

## 2013-11-14 LAB — TECHNOLOGIST SMEAR REVIEW

## 2013-11-14 LAB — HLA B*5701: HLA B 5701: NEGATIVE

## 2013-11-14 MED ORDER — SODIUM BICARBONATE 650 MG PO TABS
650.0000 mg | ORAL_TABLET | Freq: Three times a day (TID) | ORAL | Status: DC
  Administered 2013-11-14 (×3): 650 mg via ORAL
  Filled 2013-11-14 (×6): qty 1

## 2013-11-14 NOTE — Progress Notes (Signed)
*  PRELIMINARY RESULTS* Vascular Ultrasound Lower extremity venous duplex has been completed.  Preliminary findings: no evidence of DVT  Landry Mellow, RDMS, RVT  11/14/2013, 12:13 PM

## 2013-11-14 NOTE — Progress Notes (Signed)
Pt is complaining of chills and being cold. Pt denies nausea but states that his stomach is upset. Gave the pt zofran 4mg . [facility] notified. Will continue to monitor.  Shanon Rosser, RN

## 2013-11-14 NOTE — Progress Notes (Signed)
PROGRESS NOTE  Jim Wyatt LPF:[id number] DOB: 1980/06/17 DOA: 11/07/2013 PCP: No PCP Per Patient  Jim Wyatt is a 33 y.o. male with a past medical history of asthma, and a recently treated community-acquired pneumonia, who presents with a chest pain, shortness of breath and abdominal pain.  The patient was seen on 9/5 and diagnosed with community-acquired pneumonia. He was discharged home with doxycycline. He has been compliant with medication. His condition has improved initially. But 2 days ago, he started having chest pain again. It is located in the front chest. It is aggravated by coughing and deep breath. He also has a fever and chills. He has mild cough with very little sputum production. He took ibuprofen for chest pain, 12 pills in the past 2 days. During the past 2 days, he used albuterol inhaler very frequently, approximately once every hour. He has been diagnosed with new HIV.  Seen by ID.  Developed AKI and is getting IVF  Assessment/Plan:   Healthcare associated pneumonia:  -CT chest and abdomen reveals left pulmonary infiltrates.  -blood culture x 2 NGTD -urine legionella negative.  -S. pneumococcal antigen negative  -HIV positive - bactrim added for PCP coverage, But has to be stopped for acute renal failure. Changed to clindamycin.   AKI Could be secondary to combination of bactrim, vancomycin and contrast and nsaids int he last few days.  Bactrim and vancomycin stopped. Hydration and stop all nephrotoxic medications.  Renal consulted and recommendations given. Creatinine continues to increase.    HIV positive -Appreciate ID  abdominal pain and diarrhea: It is likely due to ibuprofen-induced gastritis Diarrhea resolved -C diff PCR neg -Protonix 40 mg daily  - Zofran for nausea  CT scan done  Asthma: stable. No wheezing on lung auscultation  -Albuterol inhaler when necessary   Hematuria  -resolved -?UTI- culture pending- NGTD   Refusing  heparin -SCDS and ambulation.   Fever: Blood cultures and UA done, CXR ordered. No rash seen.   Code Status: full Family Communication: patient Disposition Plan: home soon   Consultants: Renal ID    HPI/Subjective: No nausea or vomiting or abdominal pain.    Objective: Filed Vitals:   11/14/13 0327  BP: 124/70  Pulse: 99  Temp: 102 F (38.9 C)  Resp: 18    Intake/Output Summary (Last 24 hours) at 11/14/13 1647 Last data filed at 11/14/13 1445  Gross per 24 hour  Intake   1220 ml  Output      0 ml  Net   1220 ml   Filed Weights   11/07/13 0530  Weight: 94.439 kg (208 lb 3.2 oz)    Exam:   General:  A+Ox3, NAD  Cardiovascular: rrr  Respiratory: clear, no wheezing  Abdomen: +BS, soft  Musculoskeletal: no edema   Data Reviewed: Basic Metabolic Panel:  Recent Labs Lab 11/11/13 0333 11/11/13 1108 11/12/13 0004 11/13/13 0400 11/14/13 0323  NA 129* 130* 129* 131* 128*  K 4.3 4.3 4.7 5.3 5.2  CL 94* 97 97 100 98  CO2 '20 21 19 ' 17* 17*  GLUCOSE 79 91 81 80 86  BUN '20 21 21 19 21  ' CREATININE 2.55* 2.37* 2.35* 2.44* 2.60*  CALCIUM 8.0* 8.1* 7.7* 7.6* 7.5*  PHOS  --   --   --   --  2.0*   Liver Function Tests:  Recent Labs Lab 11/14/13 0323  BILITOT 0.4  ALBUMIN 1.6*   No results found for this basename: LIPASE, AMYLASE,  in the last  168 hours No results found for this basename: AMMONIA,  in the last 168 hours CBC:  Recent Labs Lab 11/08/13 0135 11/11/13 0333 11/12/13 0004 11/13/13 1130 11/14/13 0323  WBC 8.5 5.6 4.8 5.0 5.9  NEUTROABS  --   --   --   --  3.2  HGB 12.5* 12.3* 11.9* 11.4* 11.1*  HCT 36.5* 35.9* 35.2* 33.2* 32.9*  MCV 78.3 76.9* 78.6 78.3 76.5*  PLT 155 104* 98* 117* 136*   Cardiac Enzymes:  Recent Labs Lab 11/14/13 0323  CKTOTAL 328*   BNP (last 3 results)  Recent Labs  11/07/13 0855  PROBNP 33.5   CBG: No results found for this basename: GLUCAP,  in the last 168 hours  Recent Results (from the  past 240 hour(s))  CULTURE, BLOOD (ROUTINE X 2)     Status: None   Collection Time    11/07/13 12:23 AM      Result Value Ref Range Status   Specimen Description BLOOD RIGHT HAND   Final   Special Requests BOTTLES DRAWN AEROBIC AND ANAEROBIC 5CC   Final   Culture  Setup Time     Final   Value: 11/07/2013 04:48     Performed at [facility]   Culture     Final   Value: NO GROWTH 5 DAYS     Performed at [facility]   Report Status 11/13/2013 FINAL   Final  CULTURE, BLOOD (ROUTINE X 2)     Status: None   Collection Time    11/07/13 12:28 AM      Result Value Ref Range Status   Specimen Description BLOOD LEFT ANTECUBITAL   Final   Special Requests BOTTLES DRAWN AEROBIC AND ANAEROBIC 10CC   Final   Culture  Setup Time     Final   Value: 11/07/2013 04:48     Performed at [facility]   Culture     Final   Value: NO GROWTH 5 DAYS     Performed at [facility]   Report Status 11/13/2013 FINAL   Final  URINE CULTURE     Status: None   Collection Time    11/07/13  4:52 PM      Result Value Ref Range Status   Specimen Description URINE, RANDOM   Final   Special Requests NONE   Final   Culture  Setup Time     Final   Value: 11/07/2013 19:58     Performed at [place]     Final   Value: NO GROWTH     Performed at [facility]   Culture     Final   Value: NO GROWTH     Performed at [facility]   Report Status 11/09/2013 FINAL   Final  GC/CHLAMYDIA PROBE AMP     Status: None   Collection Time    11/07/13  4:52 PM      Result Value Ref Range Status   CT Probe RNA NEGATIVE  NEGATIVE Final   GC Probe RNA NEGATIVE  NEGATIVE Final   Comment: (NOTE)                                                                                               **  Normal Reference Range: Negative**          Assay performed using the Gen-Probe APTIMA COMBO2 (R) Assay.     Acceptable specimen types for this assay include APTIMA  Swabs (Unisex,     endocervical, urethral, or vaginal), first void urine, and ThinPrep     liquid based cytology samples.     Performed at [facility] DIFFICILE BY PCR     Status: None   Collection Time    11/07/13 10:35 PM      Result Value Ref Range Status   C difficile by pcr NEGATIVE  NEGATIVE Final  RESPIRATORY VIRUS PANEL     Status: None   Collection Time    11/09/13  3:19 PM      Result Value Ref Range Status   Source - RVPAN NASAL SWAB   Corrected   Comment: CORRECTED ON 09/14 AT 2100: PREVIOUSLY REPORTED AS NASAL SWAB   Respiratory Syncytial Virus A NOT DETECTED   Final   Respiratory Syncytial Virus B NOT DETECTED   Final   Influenza A NOT DETECTED   Final   Influenza B NOT DETECTED   Final   Parainfluenza 1 NOT DETECTED   Final   Parainfluenza 2 NOT DETECTED   Final   Parainfluenza 3 NOT DETECTED   Final   Metapneumovirus NOT DETECTED   Final   Rhinovirus NOT DETECTED   Final   Adenovirus NOT DETECTED   Final   Influenza A H1 NOT DETECTED   Final   Influenza A H3 NOT DETECTED   Final   Comment: (NOTE)           Normal Reference Range for each Analyte: NOT DETECTED     Testing performed using the Luminex xTAG Respiratory Viral Panel test     kit.     The analytical performance characteristics of this assay have been     determined by [facility].  The modifications have not been     cleared or approved by the FDA. This assay has been validated pursuant     to the CLIA regulations and is used for clinical purposes.     Performed at [facility]     Studies: Dg Chest 2 View  11/14/2013   CLINICAL DATA:  Fever, pneumonia  EXAM: CHEST  2 VIEW  COMPARISON:  11/07/2012  FINDINGS: Cardiomediastinal silhouette is stable. Persistent mild perihilar bronchitic changes. Again noted patchy infiltrate in the left perihilar and left infrahilar. Follow-up to complete resolution is recommended. No pulmonary edema.  IMPRESSION: Persistent  perihilar bronchitic changes and left perihilar and infrahilar infiltrate/ pneumonia. Follow-up to resolution is recommended. No new infiltrate. No pulmonary edema.   Electronically Signed   By: Lahoma Crocker M.D.   On: 11/14/2013 11:57    Scheduled Meds: . azithromycin  1,200 mg Oral Weekly  . clindamycin  300 mg Oral 4 times per day  . cyclobenzaprine  10 mg Oral Once  . nystatin  5 mL Oral QID  . pantoprazole  40 mg Oral Q1200  . primaquine  30 mg Oral Daily  . sodium bicarbonate  650 mg Oral TID   Continuous Infusions: . sodium chloride 125 mL/hr at 11/14/13 1337   Antibiotics Given (last 72 hours)   Date/Time Action Medication Dose   11/11/13 2310 Given   sulfamethoxazole-trimethoprim (BACTRIM DS) 800-160 MG per tablet 2 tablet 2 tablet   11/12/13 0915 Given   sulfamethoxazole-trimethoprim (BACTRIM DS) 800-160 MG  per tablet 2 tablet 2 tablet   11/12/13 1701 Given   clindamycin (CLEOCIN) capsule 300 mg 300 mg   11/12/13 1701 Given   primaquine tablet 30 mg 30 mg   11/12/13 2350 Given   clindamycin (CLEOCIN) capsule 300 mg 300 mg   11/13/13 0525 Given   levofloxacin (LEVAQUIN) tablet 750 mg 750 mg   11/13/13 0525 Given   clindamycin (CLEOCIN) capsule 300 mg 300 mg   11/13/13 0905 Given   primaquine tablet 30 mg 30 mg   11/13/13 1113 Given   clindamycin (CLEOCIN) capsule 300 mg 300 mg   11/13/13 1113 Given   azithromycin (ZITHROMAX) tablet 1,200 mg 1,200 mg   11/13/13 1707 Given   clindamycin (CLEOCIN) capsule 300 mg 300 mg   11/14/13 0001 Given   clindamycin (CLEOCIN) capsule 300 mg 300 mg   11/14/13 0545 Given   clindamycin (CLEOCIN) capsule 300 mg 300 mg   11/14/13 1106 Given   clindamycin (CLEOCIN) capsule 300 mg 300 mg   11/14/13 1106 Given   primaquine tablet 30 mg 30 mg      Principal Problem:   Pneumonia Active Problems:   Asthma   Elevated liver enzymes   History of shingles   Obesity   AKI (acute kidney injury)   Human immunodeficiency virus (HIV)  disease    Time spent: 25 min    [facility] Pager [phone]. If 7PM-7AM, please contact night-coverage at [email or web address], password [facility] 11/14/2013, 4:47 PM  LOS: 7 days

## 2013-11-14 NOTE — Progress Notes (Signed)
[facility] Rounding Note   Subjective:  Pt seen and examined in AM. Pt with fever of 102 F last night. He reports feeling warm with no chills. He continues to make good urine output with no hematuria or urinary symptoms. His nausea has resolved. He has a mild nonproductive cough but denies dyspnea, wheezing, LE edema, rash, arthralgias, abdominal/flank pain, or change in BM. He has decreased PO intake for the past few days and is currently on IVF's.      Objective:    General: Sleeping in bed  HEENT: No oral thrush, cervical adenopathy  Cardiac: Normal rate and regular rhythm.  Pulmonary: Decreased breath sound at bases. No wheezing, ronchi, or rales GI: Ventral hernia (midline) with tenderness. Normal BS.  Extremities: No LE edema  Vital signs in last 24 hours: Filed Vitals:   11/12/13 2032 11/13/13 0428 11/13/13 2000 11/14/13 0327  BP: 129/74 119/71 136/85 124/70  Pulse: 89 86 98 99  Temp: 98.6 F (37 C) 99.4 F (37.4 C) 99.7 F (37.6 C) 102 F (38.9 C)  TempSrc: Oral Oral Oral Oral  Resp: 18 19  18   Height:      Weight:      SpO2: 95% 95% 99% 95%   Weight change:   Intake/Output Summary (Last 24 hours) at 11/14/13 0839 Last data filed at 11/13/13 2100  Gross per 24 hour  Intake    625 ml  Output      0 ml  Net    625 ml    Physical Exam:  Blood pressure 124/70, pulse 99, temperature 102 F (38.9 C), temperature source Oral, resp. rate 18, height 5\' 7"  (1.702 m), weight 208 lb 3.2 oz (94.439 kg), SpO2 95.00%.  Labs:   Recent Labs Lab 11/07/13 0855 11/08/13 0135 11/11/13 0333 11/11/13 1108 11/12/13 0004 11/13/13 0400 11/14/13 0323  NA 133* 130* 129* 130* 129* 131* 128*  K 4.0 3.8 4.3 4.3 4.7 5.3 5.2  CL 98 97 94* 97 97 100 98  CO2 24 21 20 21 19  17* 17*  GLUCOSE 116* 136* 79 91 81 80 86  BUN 8 11 20 21 21 19 21   CREATININE 0.96 0.98 2.55* 2.37* 2.35* 2.44* 2.60*  CALCIUM 8.3* 8.2* 8.0* 8.1* 7.7* 7.6* 7.5*  PHOS  --   --   --   --   --    --  2.0*     Recent Labs Lab 11/07/13 0855 11/14/13 0323  AST 49*  --   ALT 51  --   ALKPHOS 52  --   BILITOT 0.6 0.4  PROT 7.0  --   ALBUMIN 2.2* 1.6*    Recent Labs Lab 11/07/13 0855  11/11/13 0333 11/12/13 0004 11/13/13 1130 11/14/13 0323  WBC 7.9  < > 5.6 4.8 5.0 5.9  NEUTROABS 5.8  --   --   --   --  3.2  HGB 12.2*  < > 12.3* 11.9* 11.4* 11.1*  HCT 35.9*  < > 35.9* 35.2* 33.2* 32.9*  MCV 80.5  < > 76.9* 78.6 78.3 76.5*  PLT 152  < > 104* 98* 117* 136*  < > = values in this interval not displayed.    ) Recent Labs Lab 11/07/13 0855 11/14/13 0323  CKTOTAL  --  328*  TROPONINI <0.30  --      Studies/Results: No results found.  . sodium chloride 125 mL/hr at 11/13/13 2100   . azithromycin  1,200 mg Oral Weekly  . clindamycin  300 mg Oral 4 times per day  . cyclobenzaprine  10 mg Oral Once  . levofloxacin  750 mg Oral Q48H  . nystatin  5 mL Oral QID  . pantoprazole  40 mg Oral Q1200  . primaquine  30 mg Oral Daily     I  have reviewed scheduled and prn medications.  ASSESSMENT/RECOMMENDATIONS  Assessment: Jim Wyatt is 33 yo man with pmh of asthma and newly diagnosed HIV/AIDS (CD4 40, VL 67K on 11/10/13) admitted on 9/11 for HCAP who we are consulted for in regards to AKI with a creatinine of 0.86 that has increased to 2.6 in the setting of IV contrast, IV/po bactrim, vancomycin and pre-admission NSAIDS.   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 AIN could be likely given recent fever, however no rash, arthralgias, white casts, or peripheral eosinophilia. FeNa 1.2% consistent with ATN and pt not responsive to IV fluids,  will likely take a few weeks for recovery. 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 CK level mildly elevated (not in range of rhabdo). Monitor renal function panel daily, daily weights, strict I/O's, and avoid nephrotoxins. Consider repeating UA.  Newly Diagnosed HIV Infection/AIDS - Most likely since at least 2012 (lympocytopenia on 07/30/10 CBC) ID following. Plan to start HAART once genotype known.  HCAP vs Possible PCP Pneumonia - Repeat LDH elevated at 697 with CD4 <200 concerning for PCP PNA. Currently on clindamycin and primaquine. ID following. No hypoxia requiring corticosteroid use. Consider repeat CXR. Anion Gap Metabolic Acidosis - AG 13 with bicarbonate 17 in setting of AKI. Consider adding sodium bicarbonate.  Hyponatremia - Na 135 on admission (9/11), today 128. No significant response with IV fluids. F/U serum osmolarity. Possibly SIADH due to PNA.  Hyperkalemia - K 5.2 today. Most likely due to acidosis from AKI. Continue to monitor daily.  Acute Microcytic Anemia -  Hg stable at 11.1, below baseline 14. Hemolytic labs due not suggest hemolytic anemia. F/U G6PD.   Acute Thrombocytopenia - Platelet count 136K today. Most likely due to recent bactrim use vs heparin use vs G6PD. TTP unlikely given no schistocytes on blood smear  Elevated AST/ALT - Hepatitis panel negative. No reported alcohol use. Normal appearing liver on CT abdomen. Etiology possibly due to HIV infection vs NAFLD  Above note by Dr. Naaman Plummer reviewed, pt seen and examined.  Impossible to specifically pinpoint the culprit cause of development of AKI in house following new recognition of HIV, administration of IV contrast, pre admission NSAIDS, and multiple inpt drug exposures including IV/po bactrim, vancomycin and NSAIDS.  Appears euvolemic by exam, reports voiding "hourly" although NO UOP is being recorded  by staff.  Has dipstick proteinuria (present since July - not quantitated) and one urine with TNTC RBC's although second UA this admit showed only 3-6 RBC's. Curiously increased echogenicity of only the right kidney. Creatinine has not yet plateaued. He is anxious to go home - but in my opinion should not be sent out until his renal function at least reaches a plateau. Jim Maes, MD [facility] [phone] Pager  11/14/2013, 1:44 PM

## 2013-11-14 NOTE — Progress Notes (Signed)
INFECTIOUS DISEASE PROGRESS NOTE  ID: Jim Wyatt is a 33 y.o. male with  Principal Problem:   Pneumonia Active Problems:   Asthma   HIV antibody positive   Elevated liver enzymes   History of shingles   Obesity   AKI (acute kidney injury)  Subjective: Denies loose BM, no dysuria, no pain at IV site.   Abtx:  Anti-infectives   Start     Dose/Rate Route Frequency Ordered Stop   11/13/13 1100  azithromycin (ZITHROMAX) tablet 1,200 mg     1,200 mg Oral Weekly 11/13/13 0950     11/13/13 0600  levofloxacin (LEVAQUIN) tablet 750 mg     750 mg Oral Every 48 hours 11/11/13 1306     11/12/13 1800  clindamycin (CLEOCIN) capsule 300 mg     300 mg Oral 4 times per day 11/12/13 1458 11/29/13 1759   11/12/13 1600  primaquine tablet 30 mg     30 mg Oral Daily 11/12/13 1458 11/29/13 0959   11/11/13 1600  sulfamethoxazole-trimethoprim (BACTRIM DS) 800-160 MG per tablet 2 tablet  Status:  Discontinued     2 tablet Oral 3 times daily 11/11/13 1011 11/12/13 1458   11/11/13 0500  levofloxacin (LEVAQUIN) tablet 750 mg  Status:  Discontinued     750 mg Oral Every 24 hours 11/10/13 1042 11/11/13 1306   11/10/13 1200  sulfamethoxazole-trimethoprim (BACTRIM DS) 800-160 MG per tablet 2 tablet  Status:  Discontinued     2 tablet Oral 4 times daily 11/10/13 1100 11/11/13 1011   11/10/13 0200  sulfamethoxazole-trimethoprim (BACTRIM) 340 mg of trimethoprim in dextrose 5 % 500 mL IVPB  Status:  Discontinued     340 mg of trimethoprim 347.5 mL/hr over 90 Minutes Intravenous Every 6 hours 11/09/13 2056 11/10/13 1100   11/09/13 1800  sulfamethoxazole-trimethoprim (BACTRIM) 340 mg of trimethoprim in dextrose 5 % 500 mL IVPB  Status:  Discontinued     340 mg of trimethoprim 347.5 mL/hr over 90 Minutes Intravenous 4 times per day 11/09/13 1637 11/09/13 2056   11/07/13 0800  vancomycin (VANCOCIN) IVPB 1000 mg/200 mL premix  Status:  Discontinued     1,000 mg 200 mL/hr over 60 Minutes Intravenous Every 8  hours 11/07/13 0542 11/09/13 1603   11/07/13 0600  levofloxacin (LEVAQUIN) IVPB 750 mg  Status:  Discontinued     750 mg 100 mL/hr over 90 Minutes Intravenous Every 24 hours 11/07/13 0539 11/10/13 1042   11/07/13 0545  vancomycin (VANCOCIN) IVPB 1000 mg/200 mL premix  Status:  Discontinued     1,000 mg 200 mL/hr over 60 Minutes Intravenous  Once 11/07/13 0539 11/07/13 0557   11/07/13 0030  levofloxacin (LEVAQUIN) IVPB 750 mg     750 mg 100 mL/hr over 90 Minutes Intravenous  Once 11/07/13 0029 11/07/13 0235   11/07/13 0030  vancomycin (VANCOCIN) IVPB 1000 mg/200 mL premix     1,000 mg 200 mL/hr over 60 Minutes Intravenous  Once 11/07/13 0029 11/07/13 0405      Medications:  Scheduled: . azithromycin  1,200 mg Oral Weekly  . clindamycin  300 mg Oral 4 times per day  . cyclobenzaprine  10 mg Oral Once  . nystatin  5 mL Oral QID  . pantoprazole  40 mg Oral Q1200  . primaquine  30 mg Oral Daily    Objective: Vital signs in last 24 hours: Temp:  [99.7 F (37.6 C)-102 F (38.9 C)] 102 F (38.9 C) (09/18 0327) Pulse Rate:  [98-99] 99 (  09/18 0327) Resp:  [18] 18 (09/18 0327) BP: (124-136)/(70-85) 124/70 mmHg (09/18 0327) SpO2:  [95 %-99 %] 95 % (09/18 0327)   General appearance: alert, cooperative and no distress Eyes: negative findings: conjunctivae and sclerae normal and pupils equal, round, reactive to light and accomodation Throat: lips, mucosa, and tongue normal; teeth and gums normal Resp: clear to auscultation bilaterally Cardio: regular rate and rhythm GI: normal findings: bowel sounds normal and soft, non-tender Extremities: LUE IV site is clean with no proximal cordis. He has no cordis or pain in his calves.  Lab Results  Recent Labs  11/13/13 0400 11/13/13 1130 11/14/13 0323  WBC  --  5.0 5.9  HGB  --  11.4* 11.1*  HCT  --  33.2* 32.9*  NA 131*  --  128*  K 5.3  --  5.2  CL 100  --  98  CO2 17*  --  17*  BUN 19  --  21  CREATININE 2.44*  --  2.60*    Liver Panel  Recent Labs  11/14/13 0323  ALBUMIN 1.6*  BILITOT 0.4   Sedimentation Rate No results found for this basename: ESRSEDRATE,  in the last 72 hours C-Reactive Protein No results found for this basename: CRP,  in the last 72 hours  Microbiology: Recent Results (from the past 240 hour(s))  CULTURE, BLOOD (ROUTINE X 2)     Status: None   Collection Time    11/07/13 12:23 AM      Result Value Ref Range Status   Specimen Description BLOOD RIGHT HAND   Final   Special Requests BOTTLES DRAWN AEROBIC AND ANAEROBIC 5CC   Final   Culture  Setup Time     Final   Value: 11/07/2013 04:48     Performed at [facility]   Culture     Final   Value: NO GROWTH 5 DAYS     Performed at [facility]   Report Status 11/13/2013 FINAL   Final  CULTURE, BLOOD (ROUTINE X 2)     Status: None   Collection Time    11/07/13 12:28 AM      Result Value Ref Range Status   Specimen Description BLOOD LEFT ANTECUBITAL   Final   Special Requests BOTTLES DRAWN AEROBIC AND ANAEROBIC 10CC   Final   Culture  Setup Time     Final   Value: 11/07/2013 04:48     Performed at [facility]   Culture     Final   Value: NO GROWTH 5 DAYS     Performed at [facility]   Report Status 11/13/2013 FINAL   Final  URINE CULTURE     Status: None   Collection Time    11/07/13  4:52 PM      Result Value Ref Range Status   Specimen Description URINE, RANDOM   Final   Special Requests NONE   Final   Culture  Setup Time     Final   Value: 11/07/2013 19:58     Performed at [facility]     Final   Value: NO GROWTH     Performed at [facility]   Culture     Final   Value: NO GROWTH     Performed at [facility]   Report Status 11/09/2013 FINAL   Final  GC/CHLAMYDIA PROBE AMP     Status: None   Collection Time    11/07/13  4:52 PM      Result Value Ref Range Status   CT Probe RNA NEGATIVE  NEGATIVE Final   GC Probe RNA NEGATIVE   NEGATIVE Final   Comment: (NOTE)                                                                                               **Normal Reference Range: Negative**          Assay performed using the Gen-Probe APTIMA COMBO2 (R) Assay.     Acceptable specimen types for this assay include APTIMA Swabs (Unisex,     endocervical, urethral, or vaginal), first void urine, and ThinPrep     liquid based cytology samples.     Performed at [facility] DIFFICILE BY PCR     Status: None   Collection Time    11/07/13 10:35 PM      Result Value Ref Range Status   C difficile by pcr NEGATIVE  NEGATIVE Final  RESPIRATORY VIRUS PANEL     Status: None   Collection Time    11/09/13  3:19 PM      Result Value Ref Range Status   Source - RVPAN NASAL SWAB   Corrected   Comment: CORRECTED ON 09/14 AT 2100: PREVIOUSLY REPORTED AS NASAL SWAB   Respiratory Syncytial Virus A NOT DETECTED   Final   Respiratory Syncytial Virus B NOT DETECTED   Final   Influenza A NOT DETECTED   Final   Influenza B NOT DETECTED   Final   Parainfluenza 1 NOT DETECTED   Final   Parainfluenza 2 NOT DETECTED   Final   Parainfluenza 3 NOT DETECTED   Final   Metapneumovirus NOT DETECTED   Final   Rhinovirus NOT DETECTED   Final   Adenovirus NOT DETECTED   Final   Influenza A H1 NOT DETECTED   Final   Influenza A H3 NOT DETECTED   Final   Comment: (NOTE)           Normal Reference Range for each Analyte: NOT DETECTED     Testing performed using the Luminex xTAG Respiratory Viral Panel test     kit.     The analytical performance characteristics of this assay have been     determined by [facility].  The modifications have not been     cleared or approved by the FDA. This assay has been validated pursuant     to the CLIA regulations and is used for clinical purposes.     Performed at [facility]    Studies/Results: No results found.   Assessment/Plan: AIDS  Pneumonia  ARF    Appreciate renal eval.  Will check fever w/u- doppler LE, CXR, BCx, UCx changed clinda/primaquine  Cr grossly unchanged  quantiferon (-)  Await his HIV genotype- would like to see if his Cr improves prior to starting ART.   Total days of antibiotics:  9/16 clinda/primaquine  9/11 levaquin 9/18  9/13 bactrim 9/16          Jim Wyatt Infectious Diseases (pager) [phone] [email or web address] 11/14/2013,  10:46 AM  LOS: 7 days

## 2013-11-15 ENCOUNTER — Inpatient Hospital Stay (HOSPITAL_COMMUNITY): Payer: Self-pay

## 2013-11-15 DIAGNOSIS — E872 Acidosis, unspecified: Secondary | ICD-10-CM

## 2013-11-15 LAB — CBC WITH DIFFERENTIAL/PLATELET
Basophils Absolute: 0.1 10*3/uL (ref 0.0–0.1)
Basophils Relative: 1 % (ref 0–1)
Eosinophils Absolute: 0 10*3/uL (ref 0.0–0.7)
Eosinophils Relative: 0 % (ref 0–5)
HCT: 33.4 % — ABNORMAL LOW (ref 39.0–52.0)
Hemoglobin: 11.6 g/dL — ABNORMAL LOW (ref 13.0–17.0)
Lymphocytes Relative: 25 % (ref 12–46)
Lymphs Abs: 1.7 10*3/uL (ref 0.7–4.0)
MCH: 27.4 pg (ref 26.0–34.0)
MCHC: 34.7 g/dL (ref 30.0–36.0)
MCV: 78.8 fL (ref 78.0–100.0)
Monocytes Absolute: 0.5 10*3/uL (ref 0.1–1.0)
Monocytes Relative: 8 % (ref 3–12)
Neutro Abs: 4.3 10*3/uL (ref 1.7–7.7)
Neutrophils Relative %: 66 % (ref 43–77)
Platelets: 166 10*3/uL (ref 150–400)
RBC: 4.24 MIL/uL (ref 4.22–5.81)
RDW: 15 % (ref 11.5–15.5)
WBC: 6.6 10*3/uL (ref 4.0–10.5)

## 2013-11-15 LAB — RENAL FUNCTION PANEL
Albumin: 1.5 g/dL — ABNORMAL LOW (ref 3.5–5.2)
Anion gap: 12 (ref 5–15)
BUN: 24 mg/dL — ABNORMAL HIGH (ref 6–23)
CO2: 14 mEq/L — ABNORMAL LOW (ref 19–32)
Calcium: 7.5 mg/dL — ABNORMAL LOW (ref 8.4–10.5)
Chloride: 101 mEq/L (ref 96–112)
Creatinine, Ser: 3.62 mg/dL — ABNORMAL HIGH (ref 0.50–1.35)
GFR calc Af Amer: 24 mL/min — ABNORMAL LOW (ref >90)
GFR calc non Af Amer: 21 mL/min — ABNORMAL LOW (ref >90)
Glucose, Bld: 91 mg/dL (ref 70–99)
Phosphorus: 3.4 mg/dL (ref 2.3–4.6)
Potassium: 5 mEq/L (ref 3.7–5.3)
Sodium: 127 mEq/L — ABNORMAL LOW (ref 137–147)

## 2013-11-15 LAB — URINE CULTURE
Colony Count: NO GROWTH
Culture: NO GROWTH
Special Requests: NORMAL

## 2013-11-15 LAB — OSMOLALITY, URINE: Osmolality, Ur: 318 mOsm/kg — ABNORMAL LOW (ref 390–1090)

## 2013-11-15 LAB — HEPATIC FUNCTION PANEL
ALT: 73 U/L — ABNORMAL HIGH (ref 0–53)
AST: 99 U/L — ABNORMAL HIGH (ref 0–37)
Albumin: 1.5 g/dL — ABNORMAL LOW (ref 3.5–5.2)
Alkaline Phosphatase: 75 U/L (ref 39–117)
Bilirubin, Direct: <0.2 mg/dL (ref 0.0–0.3)
Total Bilirubin: 0.4 mg/dL (ref 0.3–1.2)
Total Protein: 5.9 g/dL — ABNORMAL LOW (ref 6.0–8.3)

## 2013-11-15 LAB — SODIUM, URINE, RANDOM: Sodium, Ur: 81 mEq/L

## 2013-11-15 LAB — LIPID PANEL
Cholesterol: 100 mg/dL (ref 0–200)
HDL: 8 mg/dL — ABNORMAL LOW (ref >39)
LDL Cholesterol: 27 mg/dL (ref 0–99)
Total CHOL/HDL Ratio: 12.5 RATIO
Triglycerides: 327 mg/dL — ABNORMAL HIGH (ref <150)
VLDL: 65 mg/dL — ABNORMAL HIGH (ref 0–40)

## 2013-11-15 LAB — LIPASE, BLOOD: Lipase: 81 U/L — ABNORMAL HIGH (ref 11–59)

## 2013-11-15 MED ORDER — SODIUM BICARBONATE 8.4 % IV SOLN
INTRAVENOUS | Status: DC
  Administered 2013-11-15 – 2013-11-16 (×2): via INTRAVENOUS
  Filled 2013-11-15 (×4): qty 1000

## 2013-11-15 MED ORDER — ZOLPIDEM TARTRATE 5 MG PO TABS
5.0000 mg | ORAL_TABLET | Freq: Every evening | ORAL | Status: DC | PRN
  Administered 2013-11-15 – 2013-11-16 (×2): 5 mg via ORAL
  Filled 2013-11-15 (×2): qty 1

## 2013-11-15 MED ORDER — NAPHAZOLINE HCL 0.1 % OP SOLN
1.0000 [drp] | Freq: Four times a day (QID) | OPHTHALMIC | Status: DC | PRN
  Administered 2013-11-15: 1 [drp] via OPHTHALMIC
  Filled 2013-11-15 (×2): qty 15

## 2013-11-15 NOTE — Progress Notes (Addendum)
PROGRESS NOTE  Jim Wyatt:[id number] DOB: 01-08-1981 DOA: 11/07/2013 PCP: No PCP Per Patient  Jim Wyatt is a 33 y.o. male with a past medical history of asthma, and a recently treated community-acquired pneumonia, who presents with a chest pain, shortness of breath and abdominal pain.  The patient was seen on 9/5 and diagnosed with community-acquired pneumonia. He was discharged home with doxycycline. He has been compliant with medication. His condition has improved initially. But 2 days ago, he started having chest pain again. It is located in the front chest. It is aggravated by coughing and deep breath. He also has a fever and chills. He has mild cough with very little sputum production. He took ibuprofen for chest pain, 12 pills in the past 2 days. During the past 2 days, he used albuterol inhaler very frequently, approximately once every hour. He has been diagnosed with new HIV.  Seen by ID.  Developed AKI and is getting IVF  Assessment/Plan:   Healthcare associated pneumonia:  -CT chest and abdomen reveals left pulmonary infiltrates.  -blood culture x 2 NGTD -urine legionella negative.  -S. pneumococcal antigen negative  -HIV positive - bactrim added for PCP coverage, But has to be stopped for acute renal failure. Changed to clindamycin and primaquine.   AKI Could be secondary to combination of bactrim, vancomycin and contrast and nsaids int he last few days.  Bactrim and vancomycin stopped. Hydration and stop all nephrotoxic medications.  Renal consulted and recommendations given. Creatinine continues to increase despite, stopping the medications and started him on sodium bicarb. Plan for renal biopsy on Monday. IR consulted.   HIV positive -Appreciate ID  abdominal pain and diarrhea: thought to be  due to ibuprofen-induced gastritis. CT abdomen and pelvis on admission did not reveal any acute pathology.  Diarrhea resolved. -C diff PCR neg -Protonix 40 mg daily    - Zofran for nausea. US abdomen ordered to further evaluate the liver. Mild elevation of the liver function tests and mild elevation of the lipase.    Asthma: stable. No wheezing on lung auscultation . He reports his breathing is better.  -Albuterol inhaler when necessary   Hematuria  -resolved -?UTI- culture pending- NGTD   Refusing heparin -SCDS and ambulation.   Fever: Blood cultures and UA done, CXR ordered. No rash seen. Venous duplex negative for acute dvt. CXR does not reveal any new infiltrates. US abdomen is pending.   Code Status: full Family Communication: discussed with the mother in detail over the phone.  Disposition Plan: home soon   Consultants: Renal ID IR.    HPI/Subjective: No nausea or vomiting , BUT REPORTS OCCASIONAL abdominal pain.   Objective: Filed Vitals:   11/15/13 1550  BP: 145/81  Pulse: 105  Temp: 98.9 F (37.2 C)  Resp: 19    Intake/Output Summary (Last 24 hours) at 11/15/13 1744 Last data filed at 11/15/13 1300  Gross per 24 hour  Intake    120 ml  Output      0 ml  Net    120 ml   Filed Weights   11/07/13 0530 11/14/13 1737 11/15/13 0615  Weight: 94.439 kg (208 lb 3.2 oz) 95.346 kg (210 lb 3.2 oz) 96.026 kg (211 lb 11.2 oz)    Exam:   General:  A+Ox3, NAD  Cardiovascular: rrr  Respiratory: clear, no wheezing  Abdomen: +BS, soft  Musculoskeletal: no edema   Data Reviewed: Basic Metabolic Panel:  Recent Labs Lab 11/11/13 1108 11/12/13 0004 11/13/13  0400 11/14/13 0323 11/15/13 0820  NA 130* 129* 131* 128* 127*  K 4.3 4.7 5.3 5.2 5.0  CL 97 97 100 98 101  CO2 21 19 17* 17* 14*  GLUCOSE 91 81 80 86 91  BUN _0 24*  CREATININE 2.37* 2.35* 2.44* 2.60* 3.62*  CALCIUM 8.1* 7.7* 7.6* 7.5* 7.5*  PHOS  --   --   --  2.0* 3.4   Liver Function Tests:  Recent Labs Lab 11/14/13 0323 11/15/13 0820  AST  --  99*  ALT  --  73*  ALKPHOS  --  75  BILITOT 0.4 0.4  PROT  --  5.9*  ALBUMIN 1.6* 1.5*   1.5*    Recent Labs Lab 11/15/13 0820  LIPASE 81*   No results found for this basename: AMMONIA,  in the last 168 hours CBC:  Recent Labs Lab 11/11/13 0333 11/12/13 0004 11/13/13 1130 11/14/13 0323 11/15/13 0820  WBC 5.6 4.8 5.0 5.9 6.6  NEUTROABS  --   --   --  3.2 4.3  HGB 12.3* 11.9* 11.4* 11.1* 11.6*  HCT 35.9* 35.2* 33.2* 32.9* 33.4*  MCV 76.9* 78.6 78.3 76.5* 78.8  PLT 104* 98* 117* 136* 166   Cardiac Enzymes:  Recent Labs Lab 11/14/13 0323  CKTOTAL 328*   BNP (last 3 results)  Recent Labs  11/07/13 0855  PROBNP 33.5   CBG: No results found for this basename: GLUCAP,  in the last 168 hours  Recent Results (from the past 240 hour(s))  CULTURE, BLOOD (ROUTINE X 2)     Status: None   Collection Time    11/07/13 12:23 AM      Result Value Ref Range Status   Specimen Description BLOOD RIGHT HAND   Final   Special Requests BOTTLES DRAWN AEROBIC AND ANAEROBIC 5CC   Final   Culture  Setup Time     Final   Value: 11/07/2013 04:48     Performed at [facility]   Culture     Final   Value: NO GROWTH 5 DAYS     Performed at [facility]   Report Status 11/13/2013 FINAL   Final  CULTURE, BLOOD (ROUTINE X 2)     Status: None   Collection Time    11/07/13 12:28 AM      Result Value Ref Range Status   Specimen Description BLOOD LEFT ANTECUBITAL   Final   Special Requests BOTTLES DRAWN AEROBIC AND ANAEROBIC 10CC   Final   Culture  Setup Time     Final   Value: 11/07/2013 04:48     Performed at [facility]   Culture     Final   Value: NO GROWTH 5 DAYS     Performed at [facility]   Report Status 11/13/2013 FINAL   Final  URINE CULTURE     Status: None   Collection Time    11/07/13  4:52 PM      Result Value Ref Range Status   Specimen Description URINE, RANDOM   Final   Special Requests NONE   Final   Culture  Setup Time     Final   Value: 11/07/2013 19:58     Performed at [place]     Final    Value: NO GROWTH     Performed at [facility]   Culture     Final   Value: NO GROWTH  Performed at [facility]   Report Status 11/09/2013 FINAL   Final  GC/CHLAMYDIA PROBE AMP     Status: None   Collection Time    11/07/13  4:52 PM      Result Value Ref Range Status   CT Probe RNA NEGATIVE  NEGATIVE Final   GC Probe RNA NEGATIVE  NEGATIVE Final   Comment: (NOTE)                                                                                               **Normal Reference Range: Negative**          Assay performed using the Gen-Probe APTIMA COMBO2 (R) Assay.     Acceptable specimen types for this assay include APTIMA Swabs (Unisex,     endocervical, urethral, or vaginal), first void urine, and ThinPrep     liquid based cytology samples.     Performed at [facility] DIFFICILE BY PCR     Status: None   Collection Time    11/07/13 10:35 PM      Result Value Ref Range Status   C difficile by pcr NEGATIVE  NEGATIVE Final  RESPIRATORY VIRUS PANEL     Status: None   Collection Time    11/09/13  3:19 PM      Result Value Ref Range Status   Source - RVPAN NASAL SWAB   Corrected   Comment: CORRECTED ON 09/14 AT 2100: PREVIOUSLY REPORTED AS NASAL SWAB   Respiratory Syncytial Virus A NOT DETECTED   Final   Respiratory Syncytial Virus B NOT DETECTED   Final   Influenza A NOT DETECTED   Final   Influenza B NOT DETECTED   Final   Parainfluenza 1 NOT DETECTED   Final   Parainfluenza 2 NOT DETECTED   Final   Parainfluenza 3 NOT DETECTED   Final   Metapneumovirus NOT DETECTED   Final   Rhinovirus NOT DETECTED   Final   Adenovirus NOT DETECTED   Final   Influenza A H1 NOT DETECTED   Final   Influenza A H3 NOT DETECTED   Final   Comment: (NOTE)           Normal Reference Range for each Analyte: NOT DETECTED     Testing performed using the Luminex xTAG Respiratory Viral Panel test     kit.     The analytical performance characteristics of this  assay have been     determined by [facility].  The modifications have not been     cleared or approved by the FDA. This assay has been validated pursuant     to the CLIA regulations and is used for clinical purposes.     Performed at [place], BLOOD (ROUTINE X 2)     Status: None   Collection Time    11/14/13 12:30 PM      Result Value Ref Range Status   Specimen Description BLOOD RIGHT ARM   Final   Special Requests BOTTLES DRAWN AEROBIC ONLY 10CC   Final   Culture  Setup Time     Final   Value: 11/14/2013 21:41     Performed at [facility]   Culture     Final   Value:        BLOOD CULTURE RECEIVED NO GROWTH TO DATE CULTURE WILL BE HELD FOR 5 DAYS BEFORE ISSUING A FINAL NEGATIVE REPORT     Performed at [facility]   Report Status PENDING   Incomplete     Studies: Dg Chest 2 View  11/14/2013   CLINICAL DATA:  Fever, pneumonia  EXAM: CHEST  2 VIEW  COMPARISON:  11/07/2012  FINDINGS: Cardiomediastinal silhouette is stable. Persistent mild perihilar bronchitic changes. Again noted patchy infiltrate in the left perihilar and left infrahilar. Follow-up to complete resolution is recommended. No pulmonary edema.  IMPRESSION: Persistent perihilar bronchitic changes and left perihilar and infrahilar infiltrate/ pneumonia. Follow-up to resolution is recommended. No new infiltrate. No pulmonary edema.   Electronically Signed   By: Lahoma Crocker M.D.   On: 11/14/2013 11:57    Scheduled Meds: . azithromycin  1,200 mg Oral Weekly  . clindamycin  300 mg Oral 4 times per day  . cyclobenzaprine  10 mg Oral Once  . nystatin  5 mL Oral QID  . pantoprazole  40 mg Oral Q1200  . primaquine  30 mg Oral Daily   Continuous Infusions: . dextrose 5 % 1,000 mL with sodium bicarbonate 150 mEq infusion 75 mL/hr at 11/15/13 1330   Antibiotics Given (last 72 hours)   Date/Time Action Medication Dose   11/12/13 2350 Given   clindamycin (CLEOCIN) capsule 300 mg 300  mg   11/13/13 0525 Given   levofloxacin (LEVAQUIN) tablet 750 mg 750 mg   11/13/13 0525 Given   clindamycin (CLEOCIN) capsule 300 mg 300 mg   11/13/13 0905 Given   primaquine tablet 30 mg 30 mg   11/13/13 1113 Given   clindamycin (CLEOCIN) capsule 300 mg 300 mg   11/13/13 1113 Given   azithromycin (ZITHROMAX) tablet 1,200 mg 1,200 mg   11/13/13 1707 Given   clindamycin (CLEOCIN) capsule 300 mg 300 mg   11/14/13 0001 Given   clindamycin (CLEOCIN) capsule 300 mg 300 mg   11/14/13 0545 Given   clindamycin (CLEOCIN) capsule 300 mg 300 mg   11/14/13 1106 Given   clindamycin (CLEOCIN) capsule 300 mg 300 mg   11/14/13 1106 Given   primaquine tablet 30 mg 30 mg   11/14/13 1802 Given   clindamycin (CLEOCIN) capsule 300 mg 300 mg   11/14/13 2333 Given   clindamycin (CLEOCIN) capsule 300 mg 300 mg   11/15/13 0615 Given   clindamycin (CLEOCIN) capsule 300 mg 300 mg   11/15/13 1143 Given   clindamycin (CLEOCIN) capsule 300 mg 300 mg   11/15/13 1143 Given   primaquine tablet 30 mg 30 mg      Principal Problem:   Pneumonia Active Problems:   Asthma   Elevated liver enzymes   History of shingles   Obesity   AKI (acute kidney injury)   Human immunodeficiency virus (HIV) disease    Time spent: 25 min    [facility] Pager [phone]. If 7PM-7AM, please contact night-coverage at [email or web address], password [facility] 11/15/2013, 5:44 PM  LOS: 8 days

## 2013-11-15 NOTE — Consult Note (Signed)
Chief Complaint: Chief Complaint  Patient presents with  . Chest Pain   Acute renal failure New proteinuria  Referring Physician(s): Jamal Maes MD  History of Present Illness: Jim Wyatt is a 33 y.o. male  Pt admitted with pneumonia 11/07/13 Work up reveals worsening renal functions Acute renal insufficiency Baseline Cr 0.86 to 2.6 to 3.6 today Now with new proteinuria Recent diagnosis HIV/AIDS Dr Lorrene Reid has requested consult IR for random renal biopsy I have seen and examined pt Scheduled tentatively for 9/21  Past Medical History  Diagnosis Date  . Asthma   . Pneumonia   . Shingles     History reviewed. No pertinent past surgical history.  Allergies: Augmentin; Bactrim; Banana; Cauliflower; and Other  Medications: Prior to Admission medications   Medication Sig Start Date End Date Taking? Authorizing Provider  albuterol (PROVENTIL HFA;VENTOLIN HFA) 108 (90 BASE) MCG/ACT inhaler Inhale 2 puffs into the lungs every 6 (six) hours as needed for wheezing or shortness of breath.   Yes Historical Provider, MD  ibuprofen (ADVIL,MOTRIN) 200 MG tablet Take 600 mg by mouth 2 (two) times daily as needed.   Yes Historical Provider, MD    No family history on file.  History   Social History  . Marital Status: Single    Spouse Name: N/A    Number of Children: N/A  . Years of Education: N/A   Social History Main Topics  . Smoking status: Never Smoker   . Smokeless tobacco: None  . Alcohol Use: No  . Drug Use: No  . Sexual Activity: Yes    Partners: Female   Other Topics Concern  . None   Social History Narrative  . None     Review of Systems: A 12 point ROS discussed and pertinent positives are indicated in the HPI above.  All other systems are negative.  Review of Systems  Constitutional: Positive for appetite change. Negative for activity change, fatigue and unexpected weight change.  Respiratory: Negative for cough, choking, chest tightness  and shortness of breath.   Cardiovascular: Negative for chest pain.  Gastrointestinal: Positive for nausea. Negative for vomiting, abdominal pain and abdominal distention.  Genitourinary: Negative for difficulty urinating.  Musculoskeletal: Negative for back pain.  Skin: Negative for color change.  Psychiatric/Behavioral: Negative for confusion.    Vital Signs: BP 133/87  Pulse 98  Temp(Src) 98.6 F (37 C) (Oral)  Resp 18  Ht 5\' 7"  (1.702 m)  Wt 96.026 kg (211 lb 11.2 oz)  BMI 33.15 kg/m2  SpO2 95%  Physical Exam  Constitutional: He is oriented to person, place, and time. He appears well-nourished.  Cardiovascular: Normal rate and regular rhythm.   No murmur heard. Pulmonary/Chest: Effort normal and breath sounds normal. He has no wheezes.  Abdominal: Soft. Bowel sounds are normal. There is no tenderness.  Musculoskeletal: Normal range of motion. He exhibits no tenderness.  Neurological: He is alert and oriented to person, place, and time.  Skin: Skin is warm and dry.  Psychiatric: He has a normal mood and affect. His behavior is normal. Judgment and thought content normal.    Imaging: Dg Chest 2 View  11/14/2013   CLINICAL DATA:  Fever, pneumonia  EXAM: CHEST  2 VIEW  COMPARISON:  11/07/2012  FINDINGS: Cardiomediastinal silhouette is stable. Persistent mild perihilar bronchitic changes. Again noted patchy infiltrate in the left perihilar and left infrahilar. Follow-up to complete resolution is recommended. No pulmonary edema.  IMPRESSION: Persistent perihilar bronchitic changes and left perihilar  and infrahilar infiltrate/ pneumonia. Follow-up to resolution is recommended. No new infiltrate. No pulmonary edema.   Electronically Signed   By: Lahoma Crocker M.D.   On: 11/14/2013 11:57   Dg Chest 2 View  11/07/2013   CLINICAL DATA:  Chest pain, recent diagnosis of pneumonia.  EXAM: CHEST  2 VIEW  COMPARISON:  Chest radiograph November 02, 2013  FINDINGS: Cardiomediastinal silhouette is  unremarkable and unchanged. Mild perihilar peribronchial cuffing with slightly increasing patchy airspace opacities left mid and lower lung zone. No pleural effusions. No pneumothorax. Soft tissue planes and included osseous structures nonsuspicious peer  IMPRESSION: Perihilar peribronchial cuffing and slightly worsening left lung patchy consolidation concerning for bronchopneumonia.   Electronically Signed   By: Elon Alas   On: 11/07/2013 01:42   Dg Chest 2 View  11/02/2013   CLINICAL DATA:  Chest pain, cough for 4 days.  EXAM: CHEST  2 VIEW  COMPARISON:  Chest radiograph July 30, 2010  FINDINGS: Patchy airspace opacities in left lung. No pleural effusions. Mild prominence of the central bronchovascular markings. No pneumothorax Cardiomediastinal silhouette is unremarkable. Soft tissue planes and included osseous structures are nonsuspicious.  IMPRESSION: Patchy left lung airspace opacities concerning for pneumonia. Considering mild interstitial prominence this could reflect bronchopneumonia.   Electronically Signed   By: Elon Alas   On: 11/02/2013 00:25   Ct Angio Chest Pe W/cm &/or Wo Cm  11/07/2013   CLINICAL DATA:  Mid chest pain and shortness of breath with dry cough for several days. Diagnosed with pneumonia 5 days ago. Chest pain, hypoxia, tachycardia.  EXAM: CT ANGIOGRAPHY CHEST  CT ABDOMEN AND PELVIS WITH CONTRAST  TECHNIQUE: Multidetector CT imaging of the chest was performed using the standard protocol during bolus administration of intravenous contrast. Multiplanar CT image reconstructions and MIPs were obtained to evaluate the vascular anatomy. Multidetector CT imaging of the abdomen and pelvis was performed using the standard protocol during bolus administration of intravenous contrast.  CONTRAST:  151mL OMNIPAQUE IOHEXOL 350 MG/ML SOLN  COMPARISON:  CT abdomen and pelvis 09/03/2013.  Chest 11/07/2013.  FINDINGS: CTA CHEST FINDINGS  Technically adequate study with moderately good  opacification of the central and segmental pulmonary arteries. No focal filling defects are demonstrated. No evidence of significant pulmonary embolus.  Normal heart size. Normal caliber thoracic aorta. Great vessel origins are patent. Bilateral hilar and mediastinal lymph nodes are present without significant pathologic enlargement, likely to be reactive. Small esophageal hiatal hernia. Esophagus is decompressed.  Patchy airspace disease demonstrated in the left lung base consistent with left lower lobe pneumonia. Additional patchy infiltrative changes throughout the right lung as well. Airways appear patent. No pleural effusions. No pneumothorax.  The with  CT ABDOMEN and PELVIS FINDINGS  The liver, spleen, gallbladder, pancreas, adrenal glands, kidneys, abdominal aorta, inferior vena cava, and retroperitoneal lymph nodes are unremarkable. Stomach and small bowel are not abnormally distended. Gas and stool in the colon without distention. No free air or free fluid in the abdomen.  Pelvis: A 2 mm appendicolith is demonstrated. The appendix is not abnormally distended and no inflammatory changes are present. Unlikely to represent acute appendicitis. Bladder is mostly decompressed. Prostate gland is not enlarged. No free or loculated pelvic fluid collections. No pelvic mass or lymphadenopathy. No destructive bone lesions.  Review of the MIP images confirms the above findings.  IMPRESSION: No evidence of significant pulmonary embolus. Patchy bilateral airspace disease in the lungs, concentrated in the left lower lung, consistent with pneumonia.  Appendicolith  with without evidence of appendicitis. No acute changes otherwise demonstrated in the abdomen or pelvis.   Electronically Signed   By: Lucienne Capers M.D.   On: 11/07/2013 03:46   Ct Abdomen Pelvis W Contrast  11/07/2013   CLINICAL DATA:  Mid chest pain and shortness of breath with dry cough for several days. Diagnosed with pneumonia 5 days ago. Chest pain,  hypoxia, tachycardia.  EXAM: CT ANGIOGRAPHY CHEST  CT ABDOMEN AND PELVIS WITH CONTRAST  TECHNIQUE: Multidetector CT imaging of the chest was performed using the standard protocol during bolus administration of intravenous contrast. Multiplanar CT image reconstructions and MIPs were obtained to evaluate the vascular anatomy. Multidetector CT imaging of the abdomen and pelvis was performed using the standard protocol during bolus administration of intravenous contrast.  CONTRAST:  139mL OMNIPAQUE IOHEXOL 350 MG/ML SOLN  COMPARISON:  CT abdomen and pelvis 09/03/2013.  Chest 11/07/2013.  FINDINGS: CTA CHEST FINDINGS  Technically adequate study with moderately good opacification of the central and segmental pulmonary arteries. No focal filling defects are demonstrated. No evidence of significant pulmonary embolus.  Normal heart size. Normal caliber thoracic aorta. Great vessel origins are patent. Bilateral hilar and mediastinal lymph nodes are present without significant pathologic enlargement, likely to be reactive. Small esophageal hiatal hernia. Esophagus is decompressed.  Patchy airspace disease demonstrated in the left lung base consistent with left lower lobe pneumonia. Additional patchy infiltrative changes throughout the right lung as well. Airways appear patent. No pleural effusions. No pneumothorax.  The with  CT ABDOMEN and PELVIS FINDINGS  The liver, spleen, gallbladder, pancreas, adrenal glands, kidneys, abdominal aorta, inferior vena cava, and retroperitoneal lymph nodes are unremarkable. Stomach and small bowel are not abnormally distended. Gas and stool in the colon without distention. No free air or free fluid in the abdomen.  Pelvis: A 2 mm appendicolith is demonstrated. The appendix is not abnormally distended and no inflammatory changes are present. Unlikely to represent acute appendicitis. Bladder is mostly decompressed. Prostate gland is not enlarged. No free or loculated pelvic fluid collections.  No pelvic mass or lymphadenopathy. No destructive bone lesions.  Review of the MIP images confirms the above findings.  IMPRESSION: No evidence of significant pulmonary embolus. Patchy bilateral airspace disease in the lungs, concentrated in the left lower lung, consistent with pneumonia.  Appendicolith with without evidence of appendicitis. No acute changes otherwise demonstrated in the abdomen or pelvis.   Electronically Signed   By: Lucienne Capers M.D.   On: 11/07/2013 03:46   US Renal  11/11/2013   CLINICAL DATA:  Acute renal injury, fever  EXAM: RENAL/URINARY TRACT ULTRASOUND COMPLETE  COMPARISON:  None.  FINDINGS: Right Kidney:  Length: 12.1 cm. Diffuse increased echogenicity of the right kidney is noted.  Left Kidney:  Length: 12.7 cm. Echogenicity within normal limits. No mass or hydronephrosis visualized.  Bladder:  Incompletely distended.  No gross abnormality is noted.  IMPRESSION: Hyperechoic right kidney. This may be related to a component of medical renal disease. No other focal abnormality is noted.   Electronically Signed   By: Inez Catalina M.D.   On: 11/11/2013 10:07    Labs: Lab Results  Component Value Date   WBC 6.6 11/15/2013   HCT 33.4* 11/15/2013   MCV 78.8 11/15/2013   PLT 166 11/15/2013   NA 127* 11/15/2013   K 5.0 11/15/2013   CL 101 11/15/2013   CO2 14* 11/15/2013   GLUCOSE 91 11/15/2013   BUN 24* 11/15/2013   CREATININE 3.62* 11/15/2013  CALCIUM 7.5* 11/15/2013   PROT 5.9* 11/15/2013   ALBUMIN 1.5* 11/15/2013   ALBUMIN 1.5* 11/15/2013   AST 99* 11/15/2013   ALT 73* 11/15/2013   ALKPHOS 75 11/15/2013   BILITOT 0.4 11/15/2013   GFRNONAA 21* 11/15/2013   GFRAA 24* 11/15/2013   INR 1.07 11/07/2013    Assessment and Plan:  Worsening renal function Acute renal insufficiency New onset proteinuria Recent diagnosis HIV/AIDS Scheduled for random renal biopsy  Tentatively for 11/17/13 Check temp Pt aware of procedure benefits and risks and agreeable to proceed Consent signed  and in chart  Thank you for this interesting consult.  I greatly enjoyed meeting Jim Wyatt and look forward to participating in their care.  Lavonia Drafts PAC-IR   I spent a total of 25 minutes face to face in clinical consultation, greater than 50% of which was counseling/coordinating care for random renal biopsy

## 2013-11-15 NOTE — Progress Notes (Signed)
Pt is concerned of eyes being red-assessment shows blood shot in outside corners of both eyes; pt has complaints of stomach pain-describes the pain as being sharp; notified Dr. Karleen Hampshire, Dr. Karleen Hampshire will put in orders for clear eye drops.  Rowe Pavy, RN

## 2013-11-15 NOTE — Progress Notes (Addendum)
[facility]    Date of Admission:  11/07/2013   Total days of antibiotics 10        Day 4 clinda        Day 4 primaquine           ID: Jim Wyatt is a 33 y.o. male with  hiv disease, CD 4 count 40/VL 67,561 geno pending, ART naive admitted for pneumonia currently being treated for PCP, having worsening aki Principal Problem:   Pneumonia Active Problems:   Asthma   Elevated liver enzymes   History of shingles   Obesity   AKI (acute kidney injury)   Human immunodeficiency virus (HIV) disease    Subjective: Still feeling feverish. tmax 100.3 yesterday. Labs show continuing worsening of aki. Reports abdominal pain  Medications:  . azithromycin  1,200 mg Oral Weekly  . clindamycin  300 mg Oral 4 times per day  . cyclobenzaprine  10 mg Oral Once  . nystatin  5 mL Oral QID  . pantoprazole  40 mg Oral Q1200  . primaquine  30 mg Oral Daily    Objective: Vital signs in last 24 hours: Temp:  [98.6 F (37 C)-100.3 F (37.9 C)] 98.6 F (37 C) (09/19 1150) Pulse Rate:  [98-112] 98 (09/19 0615) Resp:  [18] 18 (09/19 0615) BP: (133-150)/(67-87) 133/87 mmHg (09/19 0615) SpO2:  [92 %-95 %] 95 % (09/19 0615) Weight:  [210 lb 3.2 oz (95.346 kg)-211 lb 11.2 oz (96.026 kg)] 211 lb 11.2 oz (96.026 kg) (09/19 0615)  Physical Exam  Constitutional: He is oriented to person, place, and time. He appears well-developed and well-nourished. No distress.  HENT:  Mouth/Throat: Oropharynx is clear and moist. No oropharyngeal exudate.  Cardiovascular: Normal rate, regular rhythm and normal heart sounds. Exam reveals no gallop and no friction rub.  No murmur heard.  Pulmonary/Chest: Effort normal and breath sounds normal. No respiratory distress. He has no wheezes.  Abdominal: Soft. Bowel sounds are normal. He exhibits no distension. There is no tenderness.  Lymphadenopathy:  He has no cervical adenopathy.  Neurological: He is alert and oriented to person,  place, and time.  Skin: warm to touch no rash  Psychiatric: He has a normal mood and affect. His behavior is normal.     Lab Results  Recent Labs  11/14/13 0323 11/15/13 0820  WBC 5.9 6.6  HGB 11.1* 11.6*  HCT 32.9* 33.4*  NA 128* 127*  K 5.2 5.0  CL 98 101  CO2 17* 14*  BUN 21 24*  CREATININE 2.60* 3.62*   Liver Panel  Recent Labs  11/14/13 0323 11/15/13 0820  PROT  --  5.9*  ALBUMIN 1.6* 1.5*  1.5*  AST  --  99*  ALT  --  73*  ALKPHOS  --  75  BILITOT 0.4 0.4  BILIDIR  --  <0.2  IBILI  --  NOT CALCULATED   Sedimentation Rate No results found for this basename: ESRSEDRATE,  in the last 72 hours C-Reactive Protein No results found for this basename: CRP,  in the last 72 hours  Microbiology: 9/18 blood cx ngtd 9/18 urine cx ngtd Studies/Results: Dg Chest 2 View  11/14/2013   CLINICAL DATA:  Fever, pneumonia  EXAM: CHEST  2 VIEW  COMPARISON:  11/07/2012  FINDINGS: Cardiomediastinal silhouette is stable. Persistent mild perihilar bronchitic changes. Again noted patchy infiltrate in the left perihilar and left infrahilar. Follow-up to complete resolution is recommended. No pulmonary edema.  IMPRESSION:  Persistent perihilar bronchitic changes and left perihilar and infrahilar infiltrate/ pneumonia. Follow-up to resolution is recommended. No new infiltrate. No pulmonary edema.   Electronically Signed   By: Lahoma Crocker M.D.   On: 11/14/2013 11:57    Assessment/Plan: Presumed PCP = continue with clindamycin and primaquine  Fever = will get afb blood culture  hiv disease= awaiting genotype but mainly deferring treatment until AKI is improved. Will undergo renal biopsy. Can do abacavir based regimen since [id number] is negative.  oi proph = azithromycin q week.  Metabolic acidosis = likely related to worsening AKI. Defer to renal went to start bicarb. Or if further worsens to whether he will need dialysis       Baxter Flattery [facility] Infect[facility]  Dise[facility] Cell: [phone] Pager: [phone]  11/15/2013, 3:13 PM

## 2013-11-15 NOTE — Progress Notes (Signed)
[facility] Rounding Note   Subjective:  Pt seen and examined in AM. Pt with low-grade fever of 100.3 F and chills last night. He continues to make good urine output with no hematuria or urinary symptoms. Overnight he was dyspneic with SpO2 of 92% requiring 2L oxygen via nasal cannula which he is currently on. He denies cough, wheezing, LE edema, rash, arthralgias, flank pain, bleeding, or change in BM. He continues to have abdominal pain and has decreased PO intake for the past few days. He continues to be on IVF's.  His renal failure continues to worsen, with heavy proteinuria noted on UPC    Objective:   Vital signs in last 24 hours: Filed Vitals:   11/14/13 1400 11/14/13 1737 11/14/13 2128 11/15/13 0615  BP: 138/79  150/67 133/87  Pulse: 101  112 98  Temp: 99.8 F (37.7 C)  100.2 F (37.9 C) 100.3 F (37.9 C)  TempSrc: Oral  Oral Oral  Resp: 18  18 18   Height:      Weight:  210 lb 3.2 oz (95.346 kg)  211 lb 11.2 oz (96.026 kg)  SpO2: 94%  92% 95%   Weight change:   Intake/Output Summary (Last 24 hours) at 11/15/13 0723 Last data filed at 11/14/13 1445  Gross per 24 hour  Intake    480 ml  Output      0 ml  Net    480 ml    Physical Exam:  Blood pressure 133/87, pulse 98, temperature 100.3 F (37.9 C), temperature source Oral, resp. rate 18, height 5\' 7"  (1.702 m), weight 211 lb 11.2 oz (96.026 kg), SpO2 95.00%.  General: Sleeping in bed Seen later and awake, alert, no distress HEENT: No oral thrush, cervical adenopathy  Cardiac: Normal rate and regular rhythm.  Pulmonary: Decreased breath sound at bases. No wheezing, ronchi, or rales GI: Ventral hernia (midline) with tenderness to palpation. Normal BS.  Extremities: No LE edema  Labs:   Recent Labs Lab 11/11/13 0333 11/11/13 1108 11/12/13 0004 11/13/13 0400 11/14/13 0323  NA 129* 130* 129* 131* 128*  K 4.3 4.3 4.7 5.3 5.2  CL 94* 97 97 100 98  CO2 20 21 19  17* 17*  GLUCOSE 79 91 81 80 86   BUN 20 21 21 19 21   CREATININE 2.55* 2.37* 2.35* 2.44* 2.60*  CALCIUM 8.0* 8.1* 7.7* 7.6* 7.5*  PHOS  --   --   --   --  2.0*     Recent Labs Lab 11/14/13 0323  BILITOT 0.4  ALBUMIN 1.6*    Recent Labs Lab 11/11/13 0333 11/12/13 0004 11/13/13 1130 11/14/13 0323  WBC 5.6 4.8 5.0 5.9  NEUTROABS  --   --   --  3.2  HGB 12.3* 11.9* 11.4* 11.1*  HCT 35.9* 35.2* 33.2* 32.9*  MCV 76.9* 78.6 78.3 76.5*  PLT 104* 98* 117* 136*     Recent Labs Lab 11/14/13 0323  CKTOTAL 328*     Studies/Results: Dg Chest 2 View  11/14/2013   CLINICAL DATA:  Fever, pneumonia  EXAM: CHEST  2 VIEW  COMPARISON:  11/07/2012  FINDINGS: Cardiomediastinal silhouette is stable. Persistent mild perihilar bronchitic changes. Again noted patchy infiltrate in the left perihilar and left infrahilar. Follow-up to complete resolution is recommended. No pulmonary edema.  IMPRESSION: Persistent perihilar bronchitic changes and left perihilar and infrahilar infiltrate/ pneumonia. Follow-up to resolution is recommended. No new infiltrate. No pulmonary edema.   Electronically Signed   By: Lahoma Crocker  M.D.   On: 11/14/2013 11:57    . sodium chloride 125 mL/hr at 11/14/13 1337   . azithromycin  1,200 mg Oral Weekly  . clindamycin  300 mg Oral 4 times per day  . cyclobenzaprine  10 mg Oral Once  . nystatin  5 mL Oral QID  . pantoprazole  40 mg Oral Q1200  . primaquine  30 mg Oral Daily  . sodium bicarbonate  650 mg Oral TID     I  have reviewed scheduled and prn medications.  ASSESSMENT/RECOMMENDATIONS   BACKGROUND Mr Kallenbach is 33 yo man with pmh of asthma and newly diagnosed HIV/AIDS (CD4 40, VL 67K on 11/10/13) admitted on 9/11 for HCAP who we are consulted for in regards to AKI with a creatinine of 0.86 that  increased to 2.6 in the setting of IV contrast, IV/po bactrim, vancomycin and pre-admission/admission NSAIDS. Renal function has continued to worsen despite elimination of offending agents, and pt  noted to have heavy proteinuria.    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  Initial working dx was AIN vs ATN (contrast/bactrim/levaquin) but with continued worsening of renal function, development of metabolic acidosis and hyperkalemia, and documentation of heavy proteinuria (16.6 grams) I have great concern about the possibility of HIVAN (ie collapsing FSGS) and cannot r/o HIVICK or other primary glomerular injury.    Continue to monitor renal function panel daily, daily weights, strict I/O's (no output recorded), and avoid nephrotoxins.  Will proceed with diagnostic percutaneous renal biopsy on Monday via IR.  Additional serologies ordered.   Newly Diagnosed HIV Infection/AIDS - Most likely since at least 2012 (lympocytopenia on 07/30/10 CBC). ID following. Plan to start HAART once genotype and renal function improves. Probable PCP Pneumonia - Persistent fevers, blood cultures pending. Repeat LDH elevated at 697, CD4 <200, and patchy inflitrate concerning for PCP PNA. Currently on clindamycin and primaquine. ID following. Pt with hypoxia overnight requiring oxygen supplementation, consider corticosteroids. Repeat CXR from 9/18 unchanged.  Metabolic Acidosis -  AG 12 with bicarbonate 14 in setting of AKI despite oral bicarb. Will give isotonic sodium bicarbonate at a rate of 50/hour Hypotonic Euvolemic Hyponatremia - Worsening. Na 127 today, 135 on admission (9/11). Chronic since >48 hr duration. No significant response with IV fluids. Obtain urine osmolarity and sodium. Possibly SIADH due to PCP PNA vs medication induced.  Hyperkalemia - Mild. Restrict diet with worsening renal function Acute Microcytic Anemia -  Hg stable at 11.6, below baseline 14. Hemolytic labs do not suggest hemolytic anemia. F/U G6PD.   Acute Thrombocytopenia - Resolved. Platelet count 166K today near baseline. Most likely due to recent  bactrim use in setting of chronic liver disease. TTP unlikely given no schistocytes on blood smear  Elevated AST/ALT - Hepatitis panel negative. No reported alcohol use. Normal appearing liver on CT abdomen. Etiology possibly due to HIV infection vs NAFLD. Repeat hepatic function panel and obtain lipase due to abdominal pain.  Juluis Mire, MD PGY-II IMTS Pager [phone] 11/15/2013, 7:23 AM  I have seen and examined this patient and agree with plan as noted above with highlighted additions. Recommend proceeding with percutaneous renal biopsy on Monday to establish cause of nephrotic range proteinuria and AKI with continued worsening.  Have discussed benefits and risks with patient and he agrees to proceed. Bx will be arranged through IR for Monday Give  IVF with bicarb slow rate.  Caren Griffins  Lorrene Reid, MD [facility] [phone] Pager 11/15/2013, 11:34 AM

## 2013-11-16 LAB — RENAL FUNCTION PANEL
Albumin: 1.3 g/dL — ABNORMAL LOW (ref 3.5–5.2)
Anion gap: 10 (ref 5–15)
BUN: 26 mg/dL — ABNORMAL HIGH (ref 6–23)
CO2: 19 mEq/L (ref 19–32)
Calcium: 7.3 mg/dL — ABNORMAL LOW (ref 8.4–10.5)
Chloride: 100 mEq/L (ref 96–112)
Creatinine, Ser: 4.53 mg/dL — ABNORMAL HIGH (ref 0.50–1.35)
GFR calc Af Amer: 19 mL/min — ABNORMAL LOW (ref >90)
GFR calc non Af Amer: 16 mL/min — ABNORMAL LOW (ref >90)
Glucose, Bld: 98 mg/dL (ref 70–99)
Phosphorus: 4 mg/dL (ref 2.3–4.6)
Potassium: 4.3 mEq/L (ref 3.7–5.3)
Sodium: 129 mEq/L — ABNORMAL LOW (ref 137–147)

## 2013-11-16 LAB — URINALYSIS, ROUTINE W REFLEX MICROSCOPIC
Bilirubin Urine: NEGATIVE
Glucose, UA: NEGATIVE mg/dL
Ketones, ur: NEGATIVE mg/dL
Leukocytes, UA: NEGATIVE
Nitrite: NEGATIVE
Protein, ur: >300 mg/dL — AB
Specific Gravity, Urine: 1.017 (ref 1.005–1.030)
Urobilinogen, UA: 0.2 mg/dL (ref 0.0–1.0)
pH: 7 (ref 5.0–8.0)

## 2013-11-16 LAB — LIPASE, BLOOD: Lipase: 73 U/L — ABNORMAL HIGH (ref 11–59)

## 2013-11-16 LAB — URINE MICROSCOPIC-ADD ON

## 2013-11-16 LAB — TROPONIN I: Troponin I: <0.3 ng/mL (ref <0.30)

## 2013-11-16 MED ORDER — MORPHINE SULFATE 2 MG/ML IJ SOLN
1.0000 mg | INTRAMUSCULAR | Status: DC | PRN
  Administered 2013-11-16: 2 mg via INTRAVENOUS
  Administered 2013-11-17: 1 mg via INTRAVENOUS
  Administered 2013-11-18 – 2013-11-20 (×3): 2 mg via INTRAVENOUS
  Filled 2013-11-16 (×5): qty 1

## 2013-11-16 MED ORDER — SODIUM BICARBONATE 650 MG PO TABS
650.0000 mg | ORAL_TABLET | Freq: Three times a day (TID) | ORAL | Status: DC
  Administered 2013-11-16 – 2013-11-18 (×6): 650 mg via ORAL
  Filled 2013-11-16 (×9): qty 1

## 2013-11-16 MED ORDER — GI COCKTAIL ~~LOC~~
30.0000 mL | Freq: Once | ORAL | Status: AC
  Administered 2013-11-16: 30 mL via ORAL
  Filled 2013-11-16: qty 30

## 2013-11-16 NOTE — Progress Notes (Signed)
[facility] Rounding Note   Subjective:  Pt seen and examined in AM. Pt's mother reports he had temperature of 101.5 F last night however per nurse he did not. He also had chills,  headache, dyspnea (low SpO2 91% requiring 1-2 L oxygen via [place]), dry cough, and possibly swelling/pain of his right foot. He continues to make good urine by his report  output (however it is not being recorded) with no hematuria or urinary symptoms. He denies rash, arthralgias, or flank pain. He continues to have abdominal pain and had recent dark stools. His renal function continues to worsen and he is scheduled to have renal biopsy on Monday.   Objective:   Vital signs in last 24 hours: Filed Vitals:   11/15/13 1516 11/15/13 1550 11/16/13 0433 11/16/13 0525  BP:  145/81  125/64  Pulse:  105  120  Temp: 99.8 F (37.7 C) 98.9 F (37.2 C)  99.6 F (37.6 C)  TempSrc:  Oral  Oral  Resp:  19  18  Height:      Weight:    213 lb 3.2 oz (96.707 kg)  SpO2:  92% 94% 91%   Weight change: 3 lb (1.361 kg)  Intake/Output Summary (Last 24 hours) at 11/16/13 0813 Last data filed at 11/15/13 1700  Gross per 24 hour  Intake    240 ml  Output      0 ml  Net    240 ml    Physical Exam:  Blood pressure 125/64, pulse 120, temperature 99.6 F (37.6 C), temperature source Oral, resp. rate 18, height 5\' 7"  (1.702 m), weight 213 lb 3.2 oz (96.707 kg), SpO2 91.00%.  General: No acute distress, breathing on 2L oxygen via [place] HEENT: No oral thrush, cervical adenopathy  Cardiac: Normal rate and regular rhythm  Pulmonary: Decreased breath sound at bases. No wheezing, ronchi, or rales GI: Ventral hernia (midline) with tenderness to palpation. Normal BS.  Extremities: No LE edema  Labs:   Recent Labs Lab 11/11/13 0333 11/11/13 1108 11/12/13 0004 11/13/13 0400 11/14/13 0323 11/15/13 0820 11/16/13 0415  NA 129* 130* 129* 131* 128* 127* 129*  K 4.3 4.3 4.7 5.3 5.2 5.0 4.3  CL 94* 97 97 100 98 101 100   CO2 20 21 19  17* 17* 14* 19  GLUCOSE 79 91 81 80 86 91 98  BUN 20 21 21 19 21  24* 26*  CREATININE 2.55* 2.37* 2.35* 2.44* 2.60* 3.62* 4.53*  CALCIUM 8.0* 8.1* 7.7* 7.6* 7.5* 7.5* 7.3*  PHOS  --   --   --   --  2.0* 3.4 4.0     Recent Labs Lab 11/14/13 0323 11/15/13 0820 11/16/13 0415  AST  --  99*  --   ALT  --  73*  --   ALKPHOS  --  75  --   BILITOT 0.4 0.4  --   PROT  --  5.9*  --   ALBUMIN 1.6* 1.5*  1.5* 1.3*    Recent Labs Lab 11/12/13 0004 11/13/13 1130 11/14/13 0323 11/15/13 0820  WBC 4.8 5.0 5.9 6.6  NEUTROABS  --   --  3.2 4.3  HGB 11.9* 11.4* 11.1* 11.6*  HCT 35.2* 33.2* 32.9* 33.4*  MCV 78.6 78.3 76.5* 78.8  PLT 98* 117* 136* 166     Recent Labs Lab 11/14/13 0323  CKTOTAL 328*     Studies/Results: Dg Chest 2 View  11/14/2013   CLINICAL DATA:  Fever, pneumonia  EXAM: CHEST  2  VIEW  COMPARISON:  11/07/2012  FINDINGS: Cardiomediastinal silhouette is stable. Persistent mild perihilar bronchitic changes. Again noted patchy infiltrate in the left perihilar and left infrahilar. Follow-up to complete resolution is recommended. No pulmonary edema.  IMPRESSION: Persistent perihilar bronchitic changes and left perihilar and infrahilar infiltrate/ pneumonia. Follow-up to resolution is recommended. No new infiltrate. No pulmonary edema.   Electronically Signed   By: Lahoma Crocker M.D.   On: 11/14/2013 11:57   US Abdomen Complete  11/15/2013   CLINICAL DATA:  Abdominal pain.  EXAM: ULTRASOUND ABDOMEN COMPLETE  COMPARISON:  CT of the abdomen and pelvis performed 11/07/2013, and renal ultrasound performed 11/11/2013  FINDINGS: Gallbladder:  A small amount of echogenic sludge is noted within the gallbladder. The gallbladder is otherwise unremarkable. No gallbladder wall thickening or pericholecystic fluid is seen. No or sonographic Murphy's sign is elicited.  Common bile duct:  Diameter: 0.5 cm, within normal limits in caliber.  Liver:  No focal lesion identified. Within  normal limits in parenchymal echogenicity.  IVC:  No abnormality visualized.  Pancreas:  Visualized portion unremarkable.  Spleen:  Size and appearance within normal limits.  Right Kidney:  Length: 13.8 cm. Significantly increased parenchymal echogenicity noted. No mass or hydronephrosis visualized.  Left Kidney:  Length: 15.3 cm. Significantly increased parenchymal echogenicity noted. No mass or hydronephrosis visualized.  Abdominal aorta:  No aneurysm visualized. The mid to distal abdominal aorta is obscured by overlying bowel gas.  Other findings:  None.  IMPRESSION: 1. No acute abnormality seen in the abdomen. 2. Small amount of echogenic sludge noted within the gallbladder; gallbladder otherwise unremarkable in appearance. 3. Significantly increased bilateral renal parenchymal echogenicity raises concern for medical renal disease, as noted on the prior study.   Electronically Signed   By: Garald Balding M.D.   On: 11/15/2013 21:47    . dextrose 5 % 1,000 mL with sodium bicarbonate 150 mEq infusion 75 mL/hr at 11/16/13 0319   . azithromycin  1,200 mg Oral Weekly  . clindamycin  300 mg Oral 4 times per day  . cyclobenzaprine  10 mg Oral Once  . nystatin  5 mL Oral QID  . pantoprazole  40 mg Oral Q1200  . primaquine  30 mg Oral Daily     I  have reviewed scheduled and prn medications.  ASSESSMENT/RECOMMENDATIONS   BACKGROUND Jim Wyatt is 33 yo man with pmh of asthma and newly diagnosed HIV/AIDS (CD4 40, VL 67K on 11/10/13) admitted on 9/11 for HCAP who we are consulted for in regards to AKI with a creatinine of 0.86 that  increased to 2.6 in the setting of IV contrast, IV/po bactrim, vancomycin and pre-admission/admission NSAIDS. Renal function has continued to worsen despite elimination of offending agents, and pt noted to have nephrotic range proteinuria.    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 Cr increased to 4.53 from 3.62 yesterday, above  baseline of 1. Pt with nephrotic-range proteinuria (16 g) and hypoalbuminemia concerning for collapsing form of FSGS or other HIV associated nephropathy (initially AKI was attributed to possible contrast +/- AIN (NSAIDS, antibiotics) vs ATN (contrast/bactrim/vancomycin/levaquin) but had only brief exposures to those meds, and this process is more rapidly progressive than expected). Continue to monitor renal function panel daily, daily weights, strict I/O's (still no output recorded but reported by pt), and avoid nephrotoxins. Pt to have IR guided diagnostic percutaneous renal biopsy on 11/17/13 to determine etiology of renal failure. Follow-up C3/C4, ANA, and ANCA screen  and repeat UA are all still pending.  Newly Diagnosed HIV Infection/AIDS - Most likely since at least 2012 (lympocytopenia on 07/30/10 CBC). ID following. Plan to start HAART once genotype known (possibly abacavir based regimen) and renal function improves. Pt on weekly 1200 mg PO azithromycin for MAC prophylaxis. 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 CXR on 9/18 unchanged. Blood cultures and AFB cultures pending. LDH elevated at 697, CD4 <200, and patchy inflitrate concerning for PCP PNA. Currently on clindamycin and primaquine. ID following. Pt with persistent hypoxia requiring oxygen supplementation, consider corticosteroids. Check pulse oximetry while ambulating.   Metabolic Acidosis -  Resolved. AG 10 with improved bicarbonate of 19 after IV bicarbonate administration. Will discontinue isotonic sodium bicarbonate 50 mL/hr  today and resume PO sodium bicarbonate 650 mg TID.  Hypotonic Euvolemic Hyponatremia - Improving. Na 129 today, 135 on admission (9/11). Chronic since >48 hr duration. No significant response with IV fluids. 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 Continue fluid restriction.  Hyperkalemia - Resolved.  Restrict diet with worsening renal function Acute Microcytic Anemia -  Hg stable at 11.6, below baseline 14. Hemolytic labs do not suggest hemolytic anemia. F/U G6PD. Obtain FOBT due to recent dark stools.  Acute Thrombocytopenia - Resolved. Platelet count 166K yesterrday near baseline. Most likely due to recent bactrim use in setting of chronic liver disease. TTP unlikely given no schistocytes on blood smear  Elevated AST/ALT - Hepatitis panel negative. No reported alcohol use. Normal appearing liver on CT abdomen. Etiology possibly due to HIV infection vs NAFLD. US abdomen on 9/19 with small amount of sludge within gallbladder. Have been abnormal since July so not new but not explained.  Jim Mire, MD PGY-II IMTS Pager [phone] 11/16/2013, 8:13 AM  I have seen and examined this patient and agree with plan as noted above with highlighted additions. Plan is for percutaneous renal biopsy on Monday to establish cause of nephrotic range proteinuria and AKI with continued worsening. Have discussed benefits and risks with patient and he agrees to proceed. Bx will be arranged through IR for Monday. There are multiple serologies still pending (ANA, complements, ANCA,) as well as a repeat UA (has had 1 UA with TNTC RBC's, a second with 3-6). Plan discussed with pt and his mother.  Jamal Maes, MD [facility] [phone] Pager 11/16/2013, 12:24 PM

## 2013-11-16 NOTE — Progress Notes (Signed)
[facility]    Date of Admission:  11/07/2013   Total days of antibiotics 11        Day 5 clinda        Day 5 primaquine           ID: Jim Wyatt is a 33 y.o. male with  hiv disease, CD 4 count 40/VL 67,561 geno pending, ART naive admitted for pneumonia currently being treated for PCP, having worsening aki Principal Problem:   Pneumonia Active Problems:   Asthma   Elevated liver enzymes   History of shingles   Obesity   AKI (acute kidney injury)   Human immunodeficiency virus (HIV) disease    Subjective: tmax 100.3 yesterday. Reports that abdominal pain is improved but now having substernal chest pain, intermittent no worsening feels somewhat worse with inspiration, occuring at rest, not necessarily worse with laying down. Worse with belching  Medications:  . azithromycin  1,200 mg Oral Weekly  . clindamycin  300 mg Oral 4 times per day  . cyclobenzaprine  10 mg Oral Once  . nystatin  5 mL Oral QID  . pantoprazole  40 mg Oral Q1200  . primaquine  30 mg Oral Daily  . sodium bicarbonate  650 mg Oral TID    Objective: Vital signs in last 24 hours: Temp:  [98.9 F (37.2 C)-99.8 F (37.7 C)] 99 F (37.2 C) (09/20 0947) Pulse Rate:  [98-120] 98 (09/20 0947) Resp:  [18-19] 19 (09/20 0947) BP: (125-145)/(64-81) 126/76 mmHg (09/20 0947) SpO2:  [91 %-94 %] 94 % (09/20 0947) Weight:  [213 lb 3.2 oz (96.707 kg)] 213 lb 3.2 oz (96.707 kg) (09/20 0525)  Physical Exam  Constitutional: He is oriented to person, place, and time. He appears well-developed and well-nourished. No distress.  HENT: left eye is injected Mouth/Throat: Oropharynx is clear and moist. No oropharyngeal exudate.  Cardiovascular: Normal rate, regular rhythm and normal heart sounds. Exam reveals no gallop and no friction rub.  No murmur heard.  chest = no chest wall tenderness Pulmonary/Chest: Effort normal and breath sounds normal. No respiratory distress. He has no  wheezes.  Abdominal: Soft. Bowel sounds are decreased.[name] He exhibits no distension. There is no tenderness.  Lymphadenopathy:  He has no cervical adenopathy.  Neurological: He is alert and oriented to person, place, and time.  Skin: warm to touch no rash  Psychiatric: He has a normal mood and affect. His behavior is normal.     Lab Results  Recent Labs  11/14/13 0323 11/15/13 0820 11/16/13 0415  WBC 5.9 6.6  --   HGB 11.1* 11.6*  --   HCT 32.9* 33.4*  --   NA 128* 127* 129*  K 5.2 5.0 4.3  CL 98 101 100  CO2 17* 14* 19  BUN 21 24* 26*  CREATININE 2.60* 3.62* 4.53*   Liver Panel  Recent Labs  11/14/13 0323 11/15/13 0820 11/16/13 0415  PROT  --  5.9*  --   ALBUMIN 1.6* 1.5*  1.5* 1.3*  AST  --  99*  --   ALT  --  73*  --   ALKPHOS  --  75  --   BILITOT 0.4 0.4  --   BILIDIR  --  <0.2  --   IBILI  --  NOT CALCULATED  --    Sedimentation Rate No results found for this basename: ESRSEDRATE,  in the last 72 hours C-Reactive Protein No results found for this basename:  CRP,  in the last 72 hours  Microbiology: 9/18 blood cx ngtd 9/18 urine cx ngtd Studies/Results: US Abdomen Complete  11/15/2013   CLINICAL DATA:  Abdominal pain.  EXAM: ULTRASOUND ABDOMEN COMPLETE  COMPARISON:  CT of the abdomen and pelvis performed 11/07/2013, and renal ultrasound performed 11/11/2013  FINDINGS: Gallbladder:  A small amount of echogenic sludge is noted within the gallbladder. The gallbladder is otherwise unremarkable. No gallbladder wall thickening or pericholecystic fluid is seen. No or sonographic Murphy's sign is elicited.  Common bile duct:  Diameter: 0.5 cm, within normal limits in caliber.  Liver:  No focal lesion identified. Within normal limits in parenchymal echogenicity.  IVC:  No abnormality visualized.  Pancreas:  Visualized portion unremarkable.  Spleen:  Size and appearance within normal limits.  Right Kidney:  Length: 13.8 cm. Significantly increased parenchymal  echogenicity noted. No mass or hydronephrosis visualized.  Left Kidney:  Length: 15.3 cm. Significantly increased parenchymal echogenicity noted. No mass or hydronephrosis visualized.  Abdominal aorta:  No aneurysm visualized. The mid to distal abdominal aorta is obscured by overlying bowel gas.  Other findings:  None.  IMPRESSION: 1. No acute abnormality seen in the abdomen. 2. Small amount of echogenic sludge noted within the gallbladder; gallbladder otherwise unremarkable in appearance. 3. Significantly increased bilateral renal parenchymal echogenicity raises concern for medical renal disease, as noted on the prior study.   Electronically Signed   By: Garald Balding M.D.   On: 11/15/2013 21:47    Assessment/Plan: Presumed PCP = continue with clindamycin and primaquine  Chest pain = primary team is starting chest pain work up. Has element that sounds like reflux. Agree to start GI cocktail to see if any improvement.  Fever = will get afb blood culture and check cmv /cmv VL  hiv disease= awaiting genotype but mainly deferring treatment until AKI is improved. Will undergo renal biopsy tomorrow. Can do abacavir based regimen since [id number] is negative.  aki = creatinine continues to worsen, but is not noticing difficulty with urination  oi proph = azithromycin q week.  Metabolic acidosis = improved with bicarb. Not meeting criteria for dialysis at this time       Baxter Flattery [facility] Cell: [phone] Pager: [phone]  11/16/2013, 2:26 PM

## 2013-11-16 NOTE — Progress Notes (Signed)
PROGRESS NOTE  Jim Wyatt FYT:[id number] DOB: 26-Mar-1980 DOA: 11/07/2013 PCP: No PCP Per Patient  Jim Wyatt is a 33 y.o. male with a past medical history of asthma, and a recently treated community-acquired pneumonia, who presents with a chest pain, shortness of breath and abdominal pain.  The patient was seen on 9/5 and diagnosed with community-acquired pneumonia. He was discharged home with doxycycline. He has been compliant with medication. His condition has improved initially. But 2 days ago, he started having chest pain again. It is located in the front chest. It is aggravated by coughing and deep breath. He also has a fever and chills. He has mild cough with very little sputum production. He took ibuprofen for chest pain, 12 pills in the past 2 days. During the past 2 days, he used albuterol inhaler very frequently, approximately once every hour. He has been diagnosed with new HIV.  Seen by ID.  Developed AKI and is getting IVF  Assessment/Plan:   Healthcare associated pneumonia:  -CT chest and abdomen reveals left pulmonary infiltrates.  -blood culture x 2 NGTD -urine legionella negative.  -S. pneumococcal antigen negative  -HIV positive - bactrim added for PCP coverage, But has to be stopped for acute renal failure. Changed to clindamycin and primaquine.   AKI Could be secondary to combination of bactrim, vancomycin and contrast and nsaids int he last few days.  Bactrim and vancomycin stopped. Hydration and stop all nephrotoxic medications.  Renal consulted and recommendations given. Creatinine continues to increase despite, stopping the medications and started him on sodium bicarb. Plan for renal biopsy on Monday. IR consulted.   HIV positive -Appreciate ID  abdominal pain: thought to be  due to ibuprofen-induced gastritis. ? Mild pancreatitis, consistent with elevation of lipase.  CT abdomen and pelvis on admission did not reveal any acute pathology.  Diarrhea  resolved. -C diff PCR neg -Protonix 40 mg daily  - Zofran for nausea. US abdomen ordered to further evaluate the liver. Mild elevation of the liver function tests and mild elevation of the lipase.    Asthma: stable. No wheezing on lung auscultation . He reports his breathing is better.  -Albuterol inhaler when necessary   Hematuria  -resolved -?UTI- culture pending- NGTD   Refusing heparin -SCDS and ambulation.   Fever: Blood cultures and UA done, CXR ordered. No rash seen. Venous duplex negative for acute dvt. CXR does not reveal any new infiltrates. US abdomen did reveal gall bladder sludge. HIDA scan ordered.  Pt reports some epigastric pain. Gi cocktail ordered . On protonix. EKG does not reveal any ischemic changes, troponin ordered.   Code Status: full Family Communication: discussed with the mother in detail.  Disposition Plan: pending further investigation.   Consultants: Renal ID IR.    HPI/Subjective: No nausea or vomiting , BUT REPORTS OCCASIONAL abdominal pain.   Objective: Filed Vitals:   11/16/13 1457  BP: 137/77  Pulse: 108  Temp: 100.3 F (37.9 C)  Resp:     Intake/Output Summary (Last 24 hours) at 11/16/13 1602 Last data filed at 11/16/13 0900  Gross per 24 hour  Intake    240 ml  Output      0 ml  Net    240 ml   Filed Weights   11/14/13 1737 11/15/13 0615 11/16/13 0525  Weight: 95.346 kg (210 lb 3.2 oz) 96.026 kg (211 lb 11.2 oz) 96.707 kg (213 lb 3.2 oz)    Exam:   General:  A+Ox3, NAD  Cardiovascular: rrr  Respiratory: clear, no wheezing  Abdomen: +BS, soft  Musculoskeletal: no edema   Data Reviewed: Basic Metabolic Panel:  Recent Labs Lab 11/12/13 0004 11/13/13 0400 11/14/13 0323 11/15/13 0820 11/16/13 0415  NA 129* 131* 128* 127* 129*  K 4.7 5.3 5.2 5.0 4.3  CL 97 100 98 101 100  CO2 19 17* 17* 14* 19  GLUCOSE 81 80 86 91 98  BUN _0 24* 26*  CREATININE 2.35* 2.44* 2.60* 3.62* 4.53*  CALCIUM 7.7* 7.6*  7.5* 7.5* 7.3*  PHOS  --   --  2.0* 3.4 4.0   Liver Function Tests:  Recent Labs Lab 11/14/13 0323 11/15/13 0820 11/16/13 0415  AST  --  99*  --   ALT  --  73*  --   ALKPHOS  --  75  --   BILITOT 0.4 0.4  --   PROT  --  5.9*  --   ALBUMIN 1.6* 1.5*  1.5* 1.3*    Recent Labs Lab 11/15/13 0820 11/16/13 1157  LIPASE 81* 73*   No results found for this basename: AMMONIA,  in the last 168 hours CBC:  Recent Labs Lab 11/11/13 0333 11/12/13 0004 11/13/13 1130 11/14/13 0323 11/15/13 0820  WBC 5.6 4.8 5.0 5.9 6.6  NEUTROABS  --   --   --  3.2 4.3  HGB 12.3* 11.9* 11.4* 11.1* 11.6*  HCT 35.9* 35.2* 33.2* 32.9* 33.4*  MCV 76.9* 78.6 78.3 76.5* 78.8  PLT 104* 98* 117* 136* 166   Cardiac Enzymes:  Recent Labs Lab 11/14/13 0323 11/16/13 1157  CKTOTAL 328*  --   TROPONINI  --  <0.30   BNP (last 3 results)  Recent Labs  11/07/13 0855  PROBNP 33.5   CBG: No results found for this basename: GLUCAP,  in the last 168 hours  Recent Results (from the past 240 hour(s))  CULTURE, BLOOD (ROUTINE X 2)     Status: None   Collection Time    11/07/13 12:23 AM      Result Value Ref Range Status   Specimen Description BLOOD RIGHT HAND   Final   Special Requests BOTTLES DRAWN AEROBIC AND ANAEROBIC 5CC   Final   Culture  Setup Time     Final   Value: 11/07/2013 04:48     Performed at [facility]   Culture     Final   Value: NO GROWTH 5 DAYS     Performed at [facility]   Report Status 11/13/2013 FINAL   Final  CULTURE, BLOOD (ROUTINE X 2)     Status: None   Collection Time    11/07/13 12:28 AM      Result Value Ref Range Status   Specimen Description BLOOD LEFT ANTECUBITAL   Final   Special Requests BOTTLES DRAWN AEROBIC AND ANAEROBIC 10CC   Final   Culture  Setup Time     Final   Value: 11/07/2013 04:48     Performed at [facility]   Culture     Final   Value: NO GROWTH 5 DAYS     Performed at [facility]   Report Status  11/13/2013 FINAL   Final  URINE CULTURE     Status: None   Collection Time    11/07/13  4:52 PM      Result Value Ref Range Status   Specimen Description URINE, RANDOM   Final   Special Requests NONE   Final  Culture  Setup Time     Final   Value: 11/07/2013 19:58     Performed at [place]     Final   Value: NO GROWTH     Performed at [facility]   Culture     Final   Value: NO GROWTH     Performed at [facility]   Report Status 11/09/2013 FINAL   Final  GC/CHLAMYDIA PROBE AMP     Status: None   Collection Time    11/07/13  4:52 PM      Result Value Ref Range Status   CT Probe RNA NEGATIVE  NEGATIVE Final   GC Probe RNA NEGATIVE  NEGATIVE Final   Comment: (NOTE)                                                                                               **Normal Reference Range: Negative**          Assay performed using the Gen-Probe APTIMA COMBO2 (R) Assay.     Acceptable specimen types for this assay include APTIMA Swabs (Unisex,     endocervical, urethral, or vaginal), first void urine, and ThinPrep     liquid based cytology samples.     Performed at [facility] DIFFICILE BY PCR     Status: None   Collection Time    11/07/13 10:35 PM      Result Value Ref Range Status   C difficile by pcr NEGATIVE  NEGATIVE Final  RESPIRATORY VIRUS PANEL     Status: None   Collection Time    11/09/13  3:19 PM      Result Value Ref Range Status   Source - RVPAN NASAL SWAB   Corrected   Comment: CORRECTED ON 09/14 AT 2100: PREVIOUSLY REPORTED AS NASAL SWAB   Respiratory Syncytial Virus A NOT DETECTED   Final   Respiratory Syncytial Virus B NOT DETECTED   Final   Influenza A NOT DETECTED   Final   Influenza B NOT DETECTED   Final   Parainfluenza 1 NOT DETECTED   Final   Parainfluenza 2 NOT DETECTED   Final   Parainfluenza 3 NOT DETECTED   Final   Metapneumovirus NOT DETECTED   Final   Rhinovirus NOT DETECTED   Final    Adenovirus NOT DETECTED   Final   Influenza A H1 NOT DETECTED   Final   Influenza A H3 NOT DETECTED   Final   Comment: (NOTE)           Normal Reference Range for each Analyte: NOT DETECTED     Testing performed using the Luminex xTAG Respiratory Viral Panel test     kit.     The analytical performance characteristics of this assay have been     determined by [facility].  The modifications have not been     cleared or approved by the FDA. This assay has been validated pursuant     to the CLIA regulations and is used for clinical purposes.  Performed at [place], BLOOD (ROUTINE X 2)     Status: None   Collection Time    11/14/13 12:30 PM      Result Value Ref Range Status   Specimen Description BLOOD RIGHT ARM   Final   Special Requests BOTTLES DRAWN AEROBIC ONLY 10CC   Final   Culture  Setup Time     Final   Value: 11/14/2013 21:41     Performed at [facility]   Culture     Final   Value:        BLOOD CULTURE RECEIVED NO GROWTH TO DATE CULTURE WILL BE HELD FOR 5 DAYS BEFORE ISSUING A FINAL NEGATIVE REPORT     Performed at [facility]   Report Status PENDING   Incomplete  URINE CULTURE     Status: None   Collection Time    11/14/13  5:44 PM      Result Value Ref Range Status   Specimen Description URINE, RANDOM   Final   Special Requests Normal   Final   Culture  Setup Time     Final   Value: 11/14/2013 18:24     Performed at [place]     Final   Value: NO GROWTH     Performed at [facility]   Culture     Final   Value: NO GROWTH     Performed at [facility]   Report Status 11/15/2013 FINAL   Final  AFB CULTURE, BLOOD     Status: None   Collection Time    11/15/13  8:40 PM      Result Value Ref Range Status   Specimen Description BLOOD RIGHT ARM   Final   Special Requests 5CC AFB   Final   Culture     Final   Value: CULTURE WILL BE EXAMINED FOR 6 WEEKS BEFORE ISSUING A  FINAL REPORT     Performed at [facility]   Report Status PENDING   Incomplete     Studies: US Abdomen Complete  11/15/2013   CLINICAL DATA:  Abdominal pain.  EXAM: ULTRASOUND ABDOMEN COMPLETE  COMPARISON:  CT of the abdomen and pelvis performed 11/07/2013, and renal ultrasound performed 11/11/2013  FINDINGS: Gallbladder:  A small amount of echogenic sludge is noted within the gallbladder. The gallbladder is otherwise unremarkable. No gallbladder wall thickening or pericholecystic fluid is seen. No or sonographic Murphy's sign is elicited.  Common bile duct:  Diameter: 0.5 cm, within normal limits in caliber.  Liver:  No focal lesion identified. Within normal limits in parenchymal echogenicity.  IVC:  No abnormality visualized.  Pancreas:  Visualized portion unremarkable.  Spleen:  Size and appearance within normal limits.  Right Kidney:  Length: 13.8 cm. Significantly increased parenchymal echogenicity noted. No mass or hydronephrosis visualized.  Left Kidney:  Length: 15.3 cm. Significantly increased parenchymal echogenicity noted. No mass or hydronephrosis visualized.  Abdominal aorta:  No aneurysm visualized. The mid to distal abdominal aorta is obscured by overlying bowel gas.  Other findings:  None.  IMPRESSION: 1. No acute abnormality seen in the abdomen. 2. Small amount of echogenic sludge noted within the gallbladder; gallbladder otherwise unremarkable in appearance. 3. Significantly increased bilateral renal parenchymal echogenicity raises concern for medical renal disease, as noted on the prior study.   Electronically Signed   By: Garald Balding M.D.   On: 11/15/2013 21:47    Scheduled Meds: .  azithromycin  1,200 mg Oral Weekly  . clindamycin  300 mg Oral 4 times per day  . cyclobenzaprine  10 mg Oral Once  . nystatin  5 mL Oral QID  . pantoprazole  40 mg Oral Q1200  . primaquine  30 mg Oral Daily  . sodium bicarbonate  650 mg Oral TID   Continuous Infusions:   Antibiotics  Given (last 72 hours)   Date/Time Action Medication Dose   11/13/13 1707 Given   clindamycin (CLEOCIN) capsule 300 mg 300 mg   11/14/13 0001 Given   clindamycin (CLEOCIN) capsule 300 mg 300 mg   11/14/13 0545 Given   clindamycin (CLEOCIN) capsule 300 mg 300 mg   11/14/13 1106 Given   clindamycin (CLEOCIN) capsule 300 mg 300 mg   11/14/13 1106 Given   primaquine tablet 30 mg 30 mg   11/14/13 1802 Given   clindamycin (CLEOCIN) capsule 300 mg 300 mg   11/14/13 2333 Given   clindamycin (CLEOCIN) capsule 300 mg 300 mg   11/15/13 0615 Given   clindamycin (CLEOCIN) capsule 300 mg 300 mg   11/15/13 1143 Given   clindamycin (CLEOCIN) capsule 300 mg 300 mg   11/15/13 1143 Given   primaquine tablet 30 mg 30 mg   11/15/13 1808 Given   clindamycin (CLEOCIN) capsule 300 mg 300 mg   11/16/13 0010 Given   clindamycin (CLEOCIN) capsule 300 mg 300 mg   11/16/13 1115 Given   clindamycin (CLEOCIN) capsule 300 mg 300 mg   11/16/13 5208 Given   primaquine tablet 30 mg 30 mg   11/16/13 1152 Given   clindamycin (CLEOCIN) capsule 300 mg 300 mg      Principal Problem:   Pneumonia Active Problems:   Asthma   Elevated liver enzymes   History of shingles   Obesity   AKI (acute kidney injury)   Human immunodeficiency virus (HIV) disease    Time spent: 25 min    [facility] Pager [phone]. If 7PM-7AM, please contact night-coverage at [email or web address], password [facility] 11/16/2013, 4:02 PM  LOS: 9 days

## 2013-11-17 ENCOUNTER — Inpatient Hospital Stay (HOSPITAL_COMMUNITY): Payer: Self-pay

## 2013-11-17 LAB — RENAL FUNCTION PANEL
Albumin: 1.3 g/dL — ABNORMAL LOW (ref 3.5–5.2)
Anion gap: 9 (ref 5–15)
BUN: 29 mg/dL — ABNORMAL HIGH (ref 6–23)
CO2: 20 mEq/L (ref 19–32)
Calcium: 7.4 mg/dL — ABNORMAL LOW (ref 8.4–10.5)
Chloride: 102 mEq/L (ref 96–112)
Creatinine, Ser: 5.2 mg/dL — ABNORMAL HIGH (ref 0.50–1.35)
GFR calc Af Amer: 16 mL/min — ABNORMAL LOW (ref >90)
GFR calc non Af Amer: 14 mL/min — ABNORMAL LOW (ref >90)
Glucose, Bld: 87 mg/dL (ref 70–99)
Phosphorus: 3.9 mg/dL (ref 2.3–4.6)
Potassium: 4.9 mEq/L (ref 3.7–5.3)
Sodium: 131 mEq/L — ABNORMAL LOW (ref 137–147)

## 2013-11-17 LAB — MPO/PR-3 (ANCA) ANTIBODIES
Myeloperoxidase Abs: <1
Serine Protease 3: <1

## 2013-11-17 LAB — LIPASE, BLOOD: Lipase: 51 U/L (ref 11–59)

## 2013-11-17 LAB — HEPATIC FUNCTION PANEL
ALT: 50 U/L (ref 0–53)
AST: 70 U/L — ABNORMAL HIGH (ref 0–37)
Albumin: 1.4 g/dL — ABNORMAL LOW (ref 3.5–5.2)
Alkaline Phosphatase: 64 U/L (ref 39–117)
Bilirubin, Direct: <0.2 mg/dL (ref 0.0–0.3)
Total Bilirubin: 0.4 mg/dL (ref 0.3–1.2)
Total Protein: 5.5 g/dL — ABNORMAL LOW (ref 6.0–8.3)

## 2013-11-17 LAB — APTT: aPTT: 37 seconds (ref 24–37)

## 2013-11-17 LAB — ANCA TITERS: C-ANCA: 1:40 {titer} — ABNORMAL HIGH

## 2013-11-17 LAB — CBC
HCT: 29.1 % — ABNORMAL LOW (ref 39.0–52.0)
Hemoglobin: 10.2 g/dL — ABNORMAL LOW (ref 13.0–17.0)
MCH: 26.8 pg (ref 26.0–34.0)
MCHC: 35.1 g/dL (ref 30.0–36.0)
MCV: 76.4 fL — ABNORMAL LOW (ref 78.0–100.0)
Platelets: 215 10*3/uL (ref 150–400)
RBC: 3.81 MIL/uL — ABNORMAL LOW (ref 4.22–5.81)
RDW: 14.9 % (ref 11.5–15.5)
WBC: 5.6 10*3/uL (ref 4.0–10.5)

## 2013-11-17 LAB — PROTIME-INR
INR: 1.02 (ref 0.00–1.49)
Prothrombin Time: 13.4 seconds (ref 11.6–15.2)

## 2013-11-17 LAB — ANCA SCREEN W REFLEX TITER
Atypical p-ANCA Screen: NEGATIVE
c-ANCA Screen: POSITIVE — AB
p-ANCA Screen: NEGATIVE

## 2013-11-17 LAB — GLUCOSE 6 PHOSPHATE DEHYDROGENASE: G6PDH: 14.4 U/g Hgb (ref 7.0–20.5)

## 2013-11-17 LAB — ANTI-DNA ANTIBODY, DOUBLE-STRANDED: ds DNA Ab: <1 IU/mL

## 2013-11-17 LAB — ANA: Anti Nuclear Antibody(ANA): NEGATIVE

## 2013-11-17 MED ORDER — POLYETHYLENE GLYCOL 3350 17 G PO PACK
17.0000 g | PACK | Freq: Every day | ORAL | Status: DC | PRN
  Filled 2013-11-17: qty 1

## 2013-11-17 MED ORDER — LIDOCAINE HCL (PF) 1 % IJ SOLN
INTRAMUSCULAR | Status: AC
  Filled 2013-11-17: qty 10

## 2013-11-17 MED ORDER — MIDAZOLAM HCL 2 MG/2ML IJ SOLN
INTRAMUSCULAR | Status: AC | PRN
  Administered 2013-11-17: 1 mg via INTRAVENOUS

## 2013-11-17 MED ORDER — HYDROCORTISONE 2.5 % RE CREA
TOPICAL_CREAM | Freq: Four times a day (QID) | RECTAL | Status: DC
  Administered 2013-11-17 – 2013-11-18 (×3): via RECTAL
  Administered 2013-11-19 (×2): 1 via RECTAL
  Administered 2013-11-21 – 2013-11-22 (×3): via RECTAL
  Filled 2013-11-17 (×2): qty 28.35

## 2013-11-17 MED ORDER — MIDAZOLAM HCL 2 MG/2ML IJ SOLN
INTRAMUSCULAR | Status: AC
  Filled 2013-11-17: qty 4

## 2013-11-17 MED ORDER — FENTANYL CITRATE 0.05 MG/ML IJ SOLN
INTRAMUSCULAR | Status: AC | PRN
  Administered 2013-11-17: 50 ug via INTRAVENOUS

## 2013-11-17 MED ORDER — TECHNETIUM TC 99M MEBROFENIN IV KIT: 5.0000 | PACK | Freq: Once | INTRAVENOUS | Status: AC | PRN

## 2013-11-17 MED ORDER — GI COCKTAIL ~~LOC~~
30.0000 mL | Freq: Once | ORAL | Status: DC
  Filled 2013-11-17: qty 30

## 2013-11-17 MED ORDER — DOCUSATE SODIUM 100 MG PO CAPS: 100.0000 mg | ORAL_CAPSULE | Freq: Two times a day (BID) | ORAL | Status: DC | PRN

## 2013-11-17 MED ORDER — FENTANYL CITRATE 0.05 MG/ML IJ SOLN
INTRAMUSCULAR | Status: AC
  Filled 2013-11-17: qty 4

## 2013-11-17 MED ORDER — HYDROCODONE-ACETAMINOPHEN 5-325 MG PO TABS
1.0000 | ORAL_TABLET | ORAL | Status: DC | PRN
  Administered 2013-11-17 (×2): 1 via ORAL
  Administered 2013-11-18 – 2013-11-25 (×23): 2 via ORAL
  Filled 2013-11-17 (×14): qty 2
  Filled 2013-11-17: qty 1
  Filled 2013-11-17 (×2): qty 2
  Filled 2013-11-17: qty 1
  Filled 2013-11-17 (×9): qty 2

## 2013-11-17 NOTE — Progress Notes (Signed)
PROGRESS NOTE  Jim Wyatt Xxxscotton PPJ:[id number] DOB: [birth year] DOA: 11/07/2013 PCP: No PCP Per Patient  Jim Wyatt is a 33 y.o. male with a past medical history of asthma, and a recently treated community-acquired pneumonia, who presents with a chest pain, shortness of breath and abdominal pain.  The patient was seen on 9/5 and diagnosed with community-acquired pneumonia. He was discharged home with doxycycline.he comes back with persistent recurrent symptoms. 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  He has been diagnosed with new HIV.  Seen by ID.  He developed acute renal failure.   Assessment/Plan:   Healthcare associated pneumonia:  -CT chest and abdomen reveals left pulmonary infiltrates.  -blood culture x 2 NGTD -urine legionella negative.  -S. pneumococcal antigen negative  -HIV positive - bactrim added for PCP coverage, But has to be stopped for acute renal failure. Changed to clindamycin and primaquine.   AKI Could be secondary to combination of bactrim, vancomycin and contrast and nsaids int he last few days.  Bactrim and vancomycin stopped. Hydration and stop all nephrotoxic medications.  Renal consulted and recommendations given. Creatinine continues to increase despite, stopping the medications and started him on sodium bicarb. S/p renal biopsy today. [name]   HIV positive -Appreciate ID  abdominal pain: thought to be  due to ibuprofen-induced gastritis. ? Mild pancreatitis, consistent with elevation of lipase.  CT abdomen and pelvis on admission did not reveal any acute pathology.  Diarrhea resolved. -C diff PCR neg -Protonix 40 mg daily  - Zofran for nausea. US abdomen ordered to further evaluate the liver. Mild elevation of the liver function tests and mild elevation of the lipase.    Asthma: stable. No wheezing on lung auscultation . He reports his breathing is better.  -Albuterol inhaler when necessary   Hematuria  -resolved -?UTI- culture pending-  NGTD   Refusing heparin -SCDS and ambulation.   Fever: Blood cultures and UA done, CXR ordered. No rash seen. Venous duplex negative for acute dvt. CXR does not reveal any new infiltrates. US abdomen did reveal gall bladder sludge. HIDA scan ordered.  Pt reports some epigastric pain. Gi cocktail ordered . On protonix. EKG does not reveal any ischemic changes, troponin ordered.   Code Status: full Family Communication: discussed with the mother in detail.  Disposition Plan: pending further investigation.   Consultants: Renal ID IR.    HPI/Subjective: No nausea or vomiting , BUT REPORTS OCCASIONAL abdominal pain.   Objective: Filed Vitals:   11/17/13 1612  BP: 144/67  Pulse: 99  Temp: 99.9 F (37.7 C)  Resp: 22    Intake/Output Summary (Last 24 hours) at 11/17/13 1923 Last data filed at 11/17/13 1230  Gross per 24 hour  Intake    240 ml  Output      0 ml  Net    240 ml   Filed Weights   11/15/13 0615 11/16/13 0525 11/17/13 0543  Weight: 96.026 kg (211 lb 11.2 oz) 96.707 kg (213 lb 3.2 oz) 95.89 kg (211 lb 6.4 oz)    Exam:   General:  A+Ox3, NAD  Cardiovascular: rrr  Respiratory: clear, no wheezing  Abdomen: +BS, soft  Musculoskeletal: no edema   Data Reviewed: Basic Metabolic Panel:  Recent Labs Lab 11/13/13 0400 11/14/13 0323 11/15/13 0820 11/16/13 0415 11/17/13 0540  NA 131* 128* 127* 129* 131*  K 5.3 5.2 5.0 4.3 4.9  CL 100 98 101 100 102  CO2 17* 17* 14* 19 20  GLUCOSE 80  86 91 98 87  BUN 19 21 24* 26* 29*  CREATININE 2.44* 2.60* 3.62* 4.53* 5.20*  CALCIUM 7.6* 7.5* 7.5* 7.3* 7.4*  PHOS  --  2.0* 3.4 4.0 3.9   Liver Function Tests:  Recent Labs Lab 11/14/13 0323 11/15/13 0820 11/16/13 0415 11/17/13 0540 11/17/13 1400  AST  --  99*  --   --  70*  ALT  --  73*  --   --  50  ALKPHOS  --  75  --   --  64  BILITOT 0.4 0.4  --   --  0.4  PROT  --  5.9*  --   --  5.5*  ALBUMIN 1.6* 1.5*  1.5* 1.3* 1.3* 1.4*    Recent  Labs Lab 11/15/13 0820 11/16/13 1157 11/17/13 0540  LIPASE 81* 73* 51   No results found for this basename: AMMONIA,  in the last 168 hours CBC:  Recent Labs Lab 11/12/13 0004 11/13/13 1130 11/14/13 0323 11/15/13 0820 11/17/13 0540  WBC 4.8 5.0 5.9 6.6 5.6  NEUTROABS  --   --  3.2 4.3  --   HGB 11.9* 11.4* 11.1* 11.6* 10.2*  HCT 35.2* 33.2* 32.9* 33.4* 29.1*  MCV 78.6 78.3 76.5* 78.8 76.4*  PLT 98* 117* 136* 166 215   Cardiac Enzymes:  Recent Labs Lab 11/14/13 0323 11/16/13 1157  CKTOTAL 328*  --   TROPONINI  --  <0.30   BNP (last 3 results)  Recent Labs  11/07/13 0855  PROBNP 33.5   CBG: No results found for this basename: GLUCAP,  in the last 168 hours  Recent Results (from the past 240 hour(s))  CLOSTRIDIUM DIFFICILE BY PCR     Status: None   Collection Time    11/07/13 10:35 PM      Result Value Ref Range Status   C difficile by pcr NEGATIVE  NEGATIVE Final  RESPIRATORY VIRUS PANEL     Status: None   Collection Time    11/09/13  3:19 PM      Result Value Ref Range Status   Source - RVPAN NASAL SWAB   Corrected   Comment: CORRECTED ON 09/14 AT 2100: PREVIOUSLY REPORTED AS NASAL SWAB   Respiratory Syncytial Virus A NOT DETECTED   Final   Respiratory Syncytial Virus B NOT DETECTED   Final   Influenza A NOT DETECTED   Final   Influenza B NOT DETECTED   Final   Parainfluenza 1 NOT DETECTED   Final   Parainfluenza 2 NOT DETECTED   Final   Parainfluenza 3 NOT DETECTED   Final   Metapneumovirus NOT DETECTED   Final   Rhinovirus NOT DETECTED   Final   Adenovirus NOT DETECTED   Final   Influenza A H1 NOT DETECTED   Final   Influenza A H3 NOT DETECTED   Final   Comment: (NOTE)           Normal Reference Range for each Analyte: NOT DETECTED     Testing performed using the Luminex xTAG Respiratory Viral Panel test     kit.     The analytical performance characteristics of this assay have been     determined by [facility].  The modifications  have not been     cleared or approved by the FDA. This assay has been validated pursuant     to the CLIA regulations and is used for clinical purposes.     Performed at [facility]  CULTURE, BLOOD (ROUTINE X 2)     Status: None   Collection Time    11/14/13 12:30 PM      Result Value Ref Range Status   Specimen Description BLOOD RIGHT ARM   Final   Special Requests BOTTLES DRAWN AEROBIC ONLY 10CC   Final   Culture  Setup Time     Final   Value: 11/14/2013 21:41     Performed at [facility]   Culture     Final   Value:        BLOOD CULTURE RECEIVED NO GROWTH TO DATE CULTURE WILL BE HELD FOR 5 DAYS BEFORE ISSUING A FINAL NEGATIVE REPORT     Performed at [facility]   Report Status PENDING   Incomplete  URINE CULTURE     Status: None   Collection Time    11/14/13  5:44 PM      Result Value Ref Range Status   Specimen Description URINE, RANDOM   Final   Special Requests Normal   Final   Culture  Setup Time     Final   Value: 11/14/2013 18:24     Performed at [facility]     Final   Value: NO GROWTH     Performed at [facility]   Culture     Final   Value: NO GROWTH     Performed at [facility]   Report Status 11/15/2013 FINAL   Final  AFB CULTURE, BLOOD     Status: None   Collection Time    11/15/13  8:40 PM      Result Value Ref Range Status   Specimen Description BLOOD RIGHT ARM   Final   Special Requests 5CC AFB   Final   Culture     Final   Value: CULTURE WILL BE EXAMINED FOR 6 WEEKS BEFORE ISSUING A FINAL REPORT     Performed at [facility]   Report Status PENDING   Incomplete     Studies: Nm Hepatobiliary  11/17/2013   CLINICAL DATA:  Abdominal pain, nausea, vomiting.  EXAM: NUCLEAR MEDICINE HEPATOBILIARY IMAGING  TECHNIQUE: Sequential images of the abdomen were obtained out to 60 minutes following intravenous administration of radiopharmaceutical.  RADIOPHARMACEUTICALS:  5.0 Millicurie  [id number] Choletec  COMPARISON:  Ultrasound 11/15/2012  FINDINGS: There is prompt uptake and excretion of radiotracer by the liver. Slight filling of the gallbladder early with the late further filling of the gallbladder. No evidence of cystic duct or common bile duct obstruction.  IMPRESSION: No evidence of biliary obstruction. There is slight filling of the gallbladder early with delayed further filling. This could reflect mild chronic gallbladder disease.   Electronically Signed   By: Rolm Baptise M.D.   On: 11/17/2013 09:27   US Abdomen Complete  11/15/2013   CLINICAL DATA:  Abdominal pain.  EXAM: ULTRASOUND ABDOMEN COMPLETE  COMPARISON:  CT of the abdomen and pelvis performed 11/07/2013, and renal ultrasound performed 11/11/2013  FINDINGS: Gallbladder:  A small amount of echogenic sludge is noted within the gallbladder. The gallbladder is otherwise unremarkable. No gallbladder wall thickening or pericholecystic fluid is seen. No or sonographic Murphy's sign is elicited.  Common bile duct:  Diameter: 0.5 cm, within normal limits in caliber.  Liver:  No focal lesion identified. Within normal limits in parenchymal echogenicity.  IVC:  No abnormality visualized.  Pancreas:  Visualized portion unremarkable.  Spleen:  Size and appearance within normal  limits.  Right Kidney:  Length: 13.8 cm. Significantly increased parenchymal echogenicity noted. No mass or hydronephrosis visualized.  Left Kidney:  Length: 15.3 cm. Significantly increased parenchymal echogenicity noted. No mass or hydronephrosis visualized.  Abdominal aorta:  No aneurysm visualized. The mid to distal abdominal aorta is obscured by overlying bowel gas.  Other findings:  None.  IMPRESSION: 1. No acute abnormality seen in the abdomen. 2. Small amount of echogenic sludge noted within the gallbladder; gallbladder otherwise unremarkable in appearance. 3. Significantly increased bilateral renal parenchymal echogenicity raises concern for medical renal  disease, as noted on the prior study.   Electronically Signed   By: Garald Balding M.D.   On: 11/15/2013 21:47   US Biopsy  11/17/2013   CLINICAL DATA:  33 year old male with acute renal failure, new diagnosis of HIV and progressive proteinuria. Random renal biopsy is warranted.  EXAM: ULTRASOUND BIOPSY CORE LIVER  Date: 11/17/2013  PROCEDURE: 1. Ultrasound-guided random biopsy of the lower pole cortex of the right kidney Interventional Radiologist:  Criselda Peaches, MD  ANESTHESIA/SEDATION: Moderate (conscious) sedation was used. Two mg Versed, 50 mcg Fentanyl were administered intravenously. The patient's vital signs were monitored continuously by radiology nursing throughout the procedure.  Sedation Time: 19 minutes  TECHNIQUE: Informed consent was obtained from the patient following explanation of the procedure, risks, benefits and alternatives. The patient understands, agrees and consents for the procedure. All questions were addressed. A time out was performed.  The bilateral flanks were interrogated with ultrasound. The right lower renal pole appears more accessible to percutaneous biopsy. An appropriate skin entry site was selected and marked. The region was sterilely prepped and draped in the standard fashion using Betadine skin prep.  Local anesthesia was attained by infiltration with 1% lidocaine. A small dermatotomy was made. Under real-time sonographic guidance, a 15 gauge trocar needle was advanced and positioned in the retroperitoneal fat just outside the renal parenchyma. Two 16 gauge core biopsies were then obtained, again under real-time sonographic guidance. Post biopsy imaging demonstrates no perinephric hematoma or evidence of active bleeding.  The biopsy specimens were placed in saline. The patient tolerated the procedure well.  COMPLICATIONS: None immediate  IMPRESSION: Technically successful ultrasound-guided random core biopsy of the lower pole cortex of the right kidney.  Signed,   Criselda Peaches, MD  [facility]  [facility]   Electronically Signed   By: Jacqulynn Cadet M.D.   On: 11/17/2013 15:35    Scheduled Meds: . azithromycin  1,200 mg Oral Weekly  . clindamycin  300 mg Oral 4 times per day  . cyclobenzaprine  10 mg Oral Once  . fentaNYL      . gi cocktail  30 mL Oral Once  . hydrocortisone   Rectal QID  . lidocaine (PF)      . midazolam      . nystatin  5 mL Oral QID  . pantoprazole  40 mg Oral Q1200  . primaquine  30 mg Oral Daily  . sodium bicarbonate  650 mg Oral TID   Continuous Infusions:   Antibiotics Given (last 72 hours)   Date/Time Action Medication Dose   11/14/13 2333 Given   clindamycin (CLEOCIN) capsule 300 mg 300 mg   11/15/13 0615 Given   clindamycin (CLEOCIN) capsule 300 mg 300 mg   11/15/13 1143 Given   clindamycin (CLEOCIN) capsule 300 mg 300 mg   11/15/13 1143 Given   primaquine tablet 30 mg 30 mg   11/15/13  1808 Given   clindamycin (CLEOCIN) capsule 300 mg 300 mg   11/16/13 0010 Given   clindamycin (CLEOCIN) capsule 300 mg 300 mg   11/16/13 0524 Given   clindamycin (CLEOCIN) capsule 300 mg 300 mg   11/16/13 5521 Given   primaquine tablet 30 mg 30 mg   11/16/13 1152 Given   clindamycin (CLEOCIN) capsule 300 mg 300 mg   11/16/13 1624 Given   clindamycin (CLEOCIN) capsule 300 mg 300 mg   11/16/13 2309 Given   clindamycin (CLEOCIN) capsule 300 mg 300 mg   11/17/13 7471 Given   clindamycin (CLEOCIN) capsule 300 mg 300 mg   11/17/13 5953 Given   primaquine tablet 30 mg 30 mg   11/17/13 1812 Given   clindamycin (CLEOCIN) capsule 300 mg 300 mg      Principal Problem:   Pneumonia Active Problems:   Asthma   Elevated liver enzymes   History of shingles   Obesity   AKI (acute kidney injury)   Human immunodeficiency virus (HIV) disease    Time spent: 25 min    [facility] Pager [phone]. If 7PM-7AM, please contact night-coverage at  [email or web address], password [facility] 11/17/2013, 7:23 PM  LOS: 10 days

## 2013-11-17 NOTE — Progress Notes (Signed)
I saw the patient and agree with the above assessment and plan.    Patient for renal biopsy today. Chart reviewed and patient has contrast load 11/07/2013 CT chest and CT abdomen pelvis.  Renal function was abnormal DFR until 11/11/2013 and has subsequently increased to 5.2 today. He also has nephrotic syndrome in the UPC of 16. This is in the setting of new diagnosis of HIV/AIDS.  He was on NSAIDs at home before admission. I think is possible that he has a component of contrast nephropathy with NSAID use leading to the acute kidney injury. Is also possible that his nephrotic syndrome as a consequence of HIV or other glomerular disease. This should be sorted out with today's biopsy. There are no indications for dialysis at the current time. We'll need to follow him closely and if there is no evidence of renal recovery pursue permanent access.  Serologies to date include a negative ANA but complements, double-stranded DNA, ANCA studies are pending.

## 2013-11-17 NOTE — Progress Notes (Signed)
Notified Dr. Karleen Hampshire of per request for something for hemmroids and stool softner.

## 2013-11-17 NOTE — Progress Notes (Signed)
[facility] Rounding Note   Subjective:  Pt seen and examined in AM.  Pt with low grade fever of 100.3 F yesterday and overnight Tmax 99.9 F. He is more conversant this morning and is feeling more like his normal self. He feels a little warm but denies chills. He reports his breathing is better and is not currently on oxygen. His mother reports he was a little short of breath after walking in the hall yesterday. He has a mild headache but denies chest pain which he believes yesterday was due to  acid reflux that improved with protonix. He denies nausea, vomiting, abdominal pain, and no longer has dark stools.  He has been making good urine by his report and has collected 3 jugs. He denies hematuria or other urinary symptoms. He denies rash, arthralgias, or flank pain. His renal function continues to worsen and he is scheduled to have renal biopsy this morning.   Objective:   Vital signs in last 24 hours: Filed Vitals:   11/16/13 0947 11/16/13 1457 11/16/13 2227 11/17/13 0543  BP: 126/76 137/77 125/70 131/62  Pulse: 98 108 93 107  Temp: 99 F (37.2 C) 100.3 F (37.9 C) 99 F (37.2 C) 99.9 F (37.7 C)  TempSrc: Oral Oral Oral Oral  Resp: 19  18 18   Height:      Weight:    211 lb 6.4 oz (95.89 kg)  SpO2: 94% 94% 92% 91%   Weight change: -1 lb 12.8 oz (-0.816 kg)  Intake/Output Summary (Last 24 hours) at 11/17/13 0730 Last data filed at 11/16/13 1800  Gross per 24 hour  Intake   2355 ml  Output      0 ml  Net   2355 ml    Physical Exam:  Blood pressure 131/62, pulse 107, temperature 99.9 F (37.7 C), temperature source Oral, resp. rate 18, height 5\' 7"  (1.702 m), weight 211 lb 6.4 oz (95.89 kg), SpO2 91.00%.  General: No acute distress, breathing on 2L oxygen via [place] HEENT: No oral thrush, cervical adenopathy  Cardiac: Normal rate and regular rhythm  Pulmonary: Crackles at  bases. No wheezing, ronchi, or rales GI: Ventral hernia (midline) with tenderness to  palpation. Normal BS.  Extremities: No LE edema  Labs:   Recent Labs Lab 11/11/13 1108 11/12/13 0004 11/13/13 0400 11/14/13 0323 11/15/13 0820 11/16/13 0415 11/17/13 0540  NA 130* 129* 131* 128* 127* 129* 131*  K 4.3 4.7 5.3 5.2 5.0 4.3 4.9  CL 97 97 100 98 101 100 102  CO2 21 19 17* 17* 14* 19 20  GLUCOSE 91 81 80 86 91 98 87  BUN 21 21 19 21  24* 26* 29*  CREATININE 2.37* 2.35* 2.44* 2.60* 3.62* 4.53* 5.20*  CALCIUM 8.1* 7.7* 7.6* 7.5* 7.5* 7.3* 7.4*  PHOS  --   --   --  2.0* 3.4 4.0 3.9     Recent Labs Lab 11/14/13 0323 11/15/13 0820 11/16/13 0415 11/17/13 0540  AST  --  99*  --   --   ALT  --  73*  --   --   ALKPHOS  --  75  --   --   BILITOT 0.4 0.4  --   --   PROT  --  5.9*  --   --   ALBUMIN 1.6* 1.5*  1.5* 1.3* 1.3*    Recent Labs Lab 11/13/13 1130 11/14/13 0323 11/15/13 0820 11/17/13 0540  WBC 5.0 5.9 6.6 5.6  NEUTROABS  --  3.2 4.3  --   HGB 11.4* 11.1* 11.6* 10.2*  HCT 33.2* 32.9* 33.4* 29.1*  MCV 78.3 76.5* 78.8 76.4*  PLT 117* 136* 166 215     Recent Labs Lab 11/14/13 0323 11/16/13 1157  CKTOTAL 328*  --   TROPONINI  --  <0.30     Studies/Results: US Abdomen Complete  11/15/2013   CLINICAL DATA:  Abdominal pain.  EXAM: ULTRASOUND ABDOMEN COMPLETE  COMPARISON:  CT of the abdomen and pelvis performed 11/07/2013, and renal ultrasound performed 11/11/2013  FINDINGS: Gallbladder:  A small amount of echogenic sludge is noted within the gallbladder. The gallbladder is otherwise unremarkable. No gallbladder wall thickening or pericholecystic fluid is seen. No or sonographic Murphy's sign is elicited.  Common bile duct:  Diameter: 0.5 cm, within normal limits in caliber.  Liver:  No focal lesion identified. Within normal limits in parenchymal echogenicity.  IVC:  No abnormality visualized.  Pancreas:  Visualized portion unremarkable.  Spleen:  Size and appearance within normal limits.  Right Kidney:  Length: 13.8 cm. Significantly increased  parenchymal echogenicity noted. No mass or hydronephrosis visualized.  Left Kidney:  Length: 15.3 cm. Significantly increased parenchymal echogenicity noted. No mass or hydronephrosis visualized.  Abdominal aorta:  No aneurysm visualized. The mid to distal abdominal aorta is obscured by overlying bowel gas.  Other findings:  None.  IMPRESSION: 1. No acute abnormality seen in the abdomen. 2. Small amount of echogenic sludge noted within the gallbladder; gallbladder otherwise unremarkable in appearance. 3. Significantly increased bilateral renal parenchymal echogenicity raises concern for medical renal disease, as noted on the prior study.   Electronically Signed   By: Garald Balding M.D.   On: 11/15/2013 21:47      . azithromycin  1,200 mg Oral Weekly  . clindamycin  300 mg Oral 4 times per day  . cyclobenzaprine  10 mg Oral Once  . nystatin  5 mL Oral QID  . pantoprazole  40 mg Oral Q1200  . primaquine  30 mg Oral Daily  . sodium bicarbonate  650 mg Oral TID     I  have reviewed scheduled and prn medications.  ASSESSMENT/RECOMMENDATIONS   BACKGROUND: Jim Wyatt is 33 yo man with pmh of asthma and newly diagnosed HIV/AIDS (CD4 40, VL 67K on 11/10/13) admitted on 9/11 for HCAP who we are consulted for in regards to AKI with a creatinine of 0.86 that  increased to 2.6 in the setting of IV contrast, IV/po bactrim, vancomycin and pre-admission/admission NSAIDS. Renal function has continued to worsen despite elimination of offending agents, and pt noted to have nephrotic range proteinuria.    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 Cr increased to 5.20 from  4.53 yesterday, above baseline of 1. Pt with nephrotic-range proteinuria (16 g) and hypoalbuminemia concerning for collapsing form of FSGS or other HIV associated nephropathy. Pt also with hematuria during hospitalization. Initially AKI thought to be due to ATN  (contast/bactrim/vancomycin/levaquin) vs AIN (NSAIDs, antibiotics, HIV infection) but renal function continues to worsen despite elimination of these offending toxin agents. Continue to monitor renal function panel daily, daily weights, strict I/O's (still no output recorded but reported by pt), and avoid nephrotoxins. Pt to have IR guided diagnostic percutaneous renal biopsy this morning to determine etiology of renal failure. Preliminary results in 48-72 hrs. Consider corticosteroid administration based on etiology. Follow-up C3/C4, ANA, and ANCA screen. Newly Diagnosed HIV 1 Infection/AIDS - Most likely since at least 2012 (  lympocytopenia on 07/30/10 CBC). ID following. Plan to start HAART once genotype known (possibly abacavir based regimen) and renal function improves. Pt on weekly 1200 mg PO azithromycin for MAC prophylaxis. 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 CXR on 9/18 unchanged. Blood cultures, AFB cultures, and CMV Ab pending. LDH elevated at 697, CD4 <200, and patchy inflitrates concerning for PCP PNA. Currently on clindamycin and primaquine. ID following. Pt with PaO2 of 56 on admission (9/11) with persistent hypoxia (SpO2 91%) during hospitalization requiring oxygen supplementation, consider corticosteroid administration. Check pulse oximetry while ambulating.   Metabolic Acidosis -  Resolved. No AG (9) with normal bicarbonate of 20. S/p IV bicarbonate administration on 9/19.Continue PO sodium bicarbonate 650 mg TID.  Hypotonic Euvolemic Hyponatremia - Improving. Na 131 today, 135 on admission (9/11). Chronic since >48 hr duration. No significant response with IV fluids. 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 Continue fluid restriction.  Hyperkalemia - Resolved. Restrict diet with worsening renal function Acute Microcytic Anemia -  Hg stable at 10.2, however below baseline 14  (on admission 12.2). Hemolytic labs do not suggest hemolytic anemia. F/U G6PD. Obtain FOBT due to recent dark stools. Pt with hematuria on recent UA on 11/16/13. Acute Thrombocytopenia - Resolved. Platelet count 215K yesterday near baseline. Most likely due to recent bactrim use in setting of chronic liver disease. TTP unlikely given no schistocytes on blood smear  Chronically elevated AST/ALT -  Elevated since July 2015. Hepatitis panel negative. No reported alcohol use. Normal appearing liver on CT abdomen. Etiology most likely due to chronic HIV infection. US abdomen on 9/19 with small amount of sludge within gallbladder.   Juluis Mire, MD PGY-II IMTS Pager [phone] 11/17/2013, 7:30 AM

## 2013-11-17 NOTE — Progress Notes (Signed)
INFECTIOUS DISEASE PROGRESS NOTE  ID: Jim Wyatt is a 33 y.o. male with  Principal Problem:   Pneumonia Active Problems:   Asthma   Elevated liver enzymes   History of shingles   Obesity   AKI (acute kidney injury)   Human immunodeficiency virus (HIV) disease  Subjective: Feeling poorly after taking his meds.  Has chest discomfort, does not feel like meds are stuck.   Abtx:  Anti-infectives   Start     Dose/Rate Route Frequency Ordered Stop   11/13/13 1100  azithromycin (ZITHROMAX) tablet 1,200 mg     1,200 mg Oral Weekly 11/13/13 0950     11/13/13 0600  levofloxacin (LEVAQUIN) tablet 750 mg  Status:  Discontinued     750 mg Oral Every 48 hours 11/11/13 1306 11/14/13 1050   11/12/13 1800  clindamycin (CLEOCIN) capsule 300 mg     300 mg Oral 4 times per day 11/12/13 1458 11/29/13 1759   11/12/13 1600  primaquine tablet 30 mg     30 mg Oral Daily 11/12/13 1458 11/29/13 0959   11/11/13 1600  sulfamethoxazole-trimethoprim (BACTRIM DS) 800-160 MG per tablet 2 tablet  Status:  Discontinued     2 tablet Oral 3 times daily 11/11/13 1011 11/12/13 1458   11/11/13 0500  levofloxacin (LEVAQUIN) tablet 750 mg  Status:  Discontinued     750 mg Oral Every 24 hours 11/10/13 1042 11/11/13 1306   11/10/13 1200  sulfamethoxazole-trimethoprim (BACTRIM DS) 800-160 MG per tablet 2 tablet  Status:  Discontinued     2 tablet Oral 4 times daily 11/10/13 1100 11/11/13 1011   11/10/13 0200  sulfamethoxazole-trimethoprim (BACTRIM) 340 mg of trimethoprim in dextrose 5 % 500 mL IVPB  Status:  Discontinued     340 mg of trimethoprim 347.5 mL/hr over 90 Minutes Intravenous Every 6 hours 11/09/13 2056 11/10/13 1100   11/09/13 1800  sulfamethoxazole-trimethoprim (BACTRIM) 340 mg of trimethoprim in dextrose 5 % 500 mL IVPB  Status:  Discontinued     340 mg of trimethoprim 347.5 mL/hr over 90 Minutes Intravenous 4 times per day 11/09/13 1637 11/09/13 2056   11/07/13 0800  vancomycin (VANCOCIN) IVPB  1000 mg/200 mL premix  Status:  Discontinued     1,000 mg 200 mL/hr over 60 Minutes Intravenous Every 8 hours 11/07/13 0542 11/09/13 1603   11/07/13 0600  levofloxacin (LEVAQUIN) IVPB 750 mg  Status:  Discontinued     750 mg 100 mL/hr over 90 Minutes Intravenous Every 24 hours 11/07/13 0539 11/10/13 1042   11/07/13 0545  vancomycin (VANCOCIN) IVPB 1000 mg/200 mL premix  Status:  Discontinued     1,000 mg 200 mL/hr over 60 Minutes Intravenous  Once 11/07/13 0539 11/07/13 0557   11/07/13 0030  levofloxacin (LEVAQUIN) IVPB 750 mg     750 mg 100 mL/hr over 90 Minutes Intravenous  Once 11/07/13 0029 11/07/13 0235   11/07/13 0030  vancomycin (VANCOCIN) IVPB 1000 mg/200 mL premix     1,000 mg 200 mL/hr over 60 Minutes Intravenous  Once 11/07/13 0029 11/07/13 0405      Medications:  Scheduled: . azithromycin  1,200 mg Oral Weekly  . clindamycin  300 mg Oral 4 times per day  . cyclobenzaprine  10 mg Oral Once  . gi cocktail  30 mL Oral Once  . nystatin  5 mL Oral QID  . pantoprazole  40 mg Oral Q1200  . primaquine  30 mg Oral Daily  . sodium bicarbonate  650 mg Oral  TID    Objective: Vital signs in last 24 hours: Temp:  [98.4 F (36.9 C)-100.3 F (37.9 C)] 98.4 F (36.9 C) (09/21 1031) Pulse Rate:  [93-108] 96 (09/21 1031) Resp:  [16-18] 16 (09/21 1031) BP: (125-137)/(62-87) 137/87 mmHg (09/21 1031) SpO2:  [91 %-94 %] 92 % (09/21 1031) Weight:  [95.89 kg (211 lb 6.4 oz)] 95.89 kg (211 lb 6.4 oz) (09/21 0543)   General appearance: alert, cooperative and no distress Resp: clear to auscultation bilaterally Cardio: regular rate and rhythm GI: normal findings: bowel sounds normal and soft, non-tender  Lab Results  Recent Labs  11/15/13 0820 11/16/13 0415 11/17/13 0540  WBC 6.6  --  5.6  HGB 11.6*  --  10.2*  HCT 33.4*  --  29.1*  NA 127* 129* 131*  K 5.0 4.3 4.9  CL 101 100 102  CO2 14* 19 20  BUN 24* 26* 29*  CREATININE 3.62* 4.53* 5.20*   Liver Panel  Recent  Labs  11/15/13 0820 11/16/13 0415 11/17/13 0540  PROT 5.9*  --   --   ALBUMIN 1.5*  1.5* 1.3* 1.3*  AST 99*  --   --   ALT 73*  --   --   ALKPHOS 75  --   --   BILITOT 0.4  --   --   BILIDIR <0.2  --   --   IBILI NOT CALCULATED  --   --    Sedimentation Rate No results found for this basename: ESRSEDRATE,  in the last 72 hours C-Reactive Protein No results found for this basename: CRP,  in the last 72 hours  Microbiology: Recent Results (from the past 240 hour(s))  URINE CULTURE     Status: None   Collection Time    11/07/13  4:52 PM      Result Value Ref Range Status   Specimen Description URINE, RANDOM   Final   Special Requests NONE   Final   Culture  Setup Time     Final   Value: 11/07/2013 19:58     Performed at [facility]     Final   Value: NO GROWTH     Performed at [facility]   Culture     Final   Value: NO GROWTH     Performed at [facility]   Report Status 11/09/2013 FINAL   Final  GC/CHLAMYDIA PROBE AMP     Status: None   Collection Time    11/07/13  4:52 PM      Result Value Ref Range Status   CT Probe RNA NEGATIVE  NEGATIVE Final   GC Probe RNA NEGATIVE  NEGATIVE Final   Comment: (NOTE)                                                                                               **Normal Reference Range: Negative**          Assay performed using the Gen-Probe APTIMA COMBO2 (R) Assay.     Acceptable specimen types for this assay include APTIMA Swabs (Unisex,  endocervical, urethral, or vaginal), first void urine, and ThinPrep     liquid based cytology samples.     Performed at [facility] DIFFICILE BY PCR     Status: None   Collection Time    11/07/13 10:35 PM      Result Value Ref Range Status   C difficile by pcr NEGATIVE  NEGATIVE Final  RESPIRATORY VIRUS PANEL     Status: None   Collection Time    11/09/13  3:19 PM      Result Value Ref Range Status   Source - RVPAN NASAL  SWAB   Corrected   Comment: CORRECTED ON 09/14 AT 2100: PREVIOUSLY REPORTED AS NASAL SWAB   Respiratory Syncytial Virus A NOT DETECTED   Final   Respiratory Syncytial Virus B NOT DETECTED   Final   Influenza A NOT DETECTED   Final   Influenza B NOT DETECTED   Final   Parainfluenza 1 NOT DETECTED   Final   Parainfluenza 2 NOT DETECTED   Final   Parainfluenza 3 NOT DETECTED   Final   Metapneumovirus NOT DETECTED   Final   Rhinovirus NOT DETECTED   Final   Adenovirus NOT DETECTED   Final   Influenza A H1 NOT DETECTED   Final   Influenza A H3 NOT DETECTED   Final   Comment: (NOTE)           Normal Reference Range for each Analyte: NOT DETECTED     Testing performed using the Luminex xTAG Respiratory Viral Panel test     kit.     The analytical performance characteristics of this assay have been     determined by [facility].  The modifications have not been     cleared or approved by the FDA. This assay has been validated pursuant     to the CLIA regulations and is used for clinical purposes.     Performed at [place], BLOOD (ROUTINE X 2)     Status: None   Collection Time    11/14/13 12:30 PM      Result Value Ref Range Status   Specimen Description BLOOD RIGHT ARM   Final   Special Requests BOTTLES DRAWN AEROBIC ONLY 10CC   Final   Culture  Setup Time     Final   Value: 11/14/2013 21:41     Performed at [facility]   Culture     Final   Value:        BLOOD CULTURE RECEIVED NO GROWTH TO DATE CULTURE WILL BE HELD FOR 5 DAYS BEFORE ISSUING A FINAL NEGATIVE REPORT     Performed at [facility]   Report Status PENDING   Incomplete  URINE CULTURE     Status: None   Collection Time    11/14/13  5:44 PM      Result Value Ref Range Status   Specimen Description URINE, RANDOM   Final   Special Requests Normal   Final   Culture  Setup Time     Final   Value: 11/14/2013 18:24     Performed at [place]     Final     Value: NO GROWTH     Performed at [facility]   Culture     Final   Value: NO GROWTH     Performed at [facility]   Report Status 11/15/2013 FINAL  Final  AFB CULTURE, BLOOD     Status: None   Collection Time    11/15/13  8:40 PM      Result Value Ref Range Status   Specimen Description BLOOD RIGHT ARM   Final   Special Requests 5CC AFB   Final   Culture     Final   Value: CULTURE WILL BE EXAMINED FOR 6 WEEKS BEFORE ISSUING A FINAL REPORT     Performed at [facility]   Report Status PENDING   Incomplete    Studies/Results: Nm Hepatobiliary  11/17/2013   CLINICAL DATA:  Abdominal pain, nausea, vomiting.  EXAM: NUCLEAR MEDICINE HEPATOBILIARY IMAGING  TECHNIQUE: Sequential images of the abdomen were obtained out to 60 minutes following intravenous administration of radiopharmaceutical.  RADIOPHARMACEUTICALS:  5.0 Millicurie IY-64B Choletec  COMPARISON:  Ultrasound 11/15/2012  FINDINGS: There is prompt uptake and excretion of radiotracer by the liver. Slight filling of the gallbladder early with the late further filling of the gallbladder. No evidence of cystic duct or common bile duct obstruction.  IMPRESSION: No evidence of biliary obstruction. There is slight filling of the gallbladder early with delayed further filling. This could reflect mild chronic gallbladder disease.   Electronically Signed   By: Rolm Baptise M.D.   On: 11/17/2013 09:27   US Abdomen Complete  11/15/2013   CLINICAL DATA:  Abdominal pain.  EXAM: ULTRASOUND ABDOMEN COMPLETE  COMPARISON:  CT of the abdomen and pelvis performed 11/07/2013, and renal ultrasound performed 11/11/2013  FINDINGS: Gallbladder:  A small amount of echogenic sludge is noted within the gallbladder. The gallbladder is otherwise unremarkable. No gallbladder wall thickening or pericholecystic fluid is seen. No or sonographic Murphy's sign is elicited.  Common bile duct:  Diameter: 0.5 cm, within normal limits in caliber.   Liver:  No focal lesion identified. Within normal limits in parenchymal echogenicity.  IVC:  No abnormality visualized.  Pancreas:  Visualized portion unremarkable.  Spleen:  Size and appearance within normal limits.  Right Kidney:  Length: 13.8 cm. Significantly increased parenchymal echogenicity noted. No mass or hydronephrosis visualized.  Left Kidney:  Length: 15.3 cm. Significantly increased parenchymal echogenicity noted. No mass or hydronephrosis visualized.  Abdominal aorta:  No aneurysm visualized. The mid to distal abdominal aorta is obscured by overlying bowel gas.  Other findings:  None.  IMPRESSION: 1. No acute abnormality seen in the abdomen. 2. Small amount of echogenic sludge noted within the gallbladder; gallbladder otherwise unremarkable in appearance. 3. Significantly increased bilateral renal parenchymal echogenicity raises concern for medical renal disease, as noted on the prior study.   Electronically Signed   By: Garald Balding M.D.   On: 11/15/2013 21:47     Assessment/Plan: AIDS  Pneumonia  ARF   changed clinda/primaquine  Fever w/u negative (BCx, UCx, doppler) For renal Bx today quantiferon (-)  Await his HIV genotype- would like to see if his Cr improves prior to starting ART.   Total days of antibiotics:  9/16 clinda/primaquine  9/11 levaquin 9/18  9/13 bactrim 9/16         Bobby Rumpf Infectious Diseases (pager) [phone] [email or web address] 11/17/2013, 11:36 AM  LOS: 10 days

## 2013-11-17 NOTE — Progress Notes (Signed)
CP note: pt called out c/o chest discomfort. Student nurse in to assess, get EKG and VS. Dr. Karleen Hampshire notified and will continue to monitor.

## 2013-11-17 NOTE — Procedures (Signed)
Interventional Radiology Procedure Note  Procedure:  Random core biopsy of right lower pole renal cortex Complications: None immediate Recommendations:  - Bedrest x 4 hrs - Path pending  Signed,  Criselda Peaches, MD

## 2013-11-18 DIAGNOSIS — D509 Iron deficiency anemia, unspecified: Secondary | ICD-10-CM

## 2013-11-18 DIAGNOSIS — I10 Essential (primary) hypertension: Secondary | ICD-10-CM

## 2013-11-18 DIAGNOSIS — E871 Hypo-osmolality and hyponatremia: Secondary | ICD-10-CM

## 2013-11-18 LAB — CMV IGM: CMV IgM: 53.4 AU/mL — ABNORMAL HIGH (ref <30.00)

## 2013-11-18 LAB — RENAL FUNCTION PANEL
Albumin: 1.5 g/dL — ABNORMAL LOW (ref 3.5–5.2)
Anion gap: 11 (ref 5–15)
BUN: 35 mg/dL — ABNORMAL HIGH (ref 6–23)
CO2: 19 mEq/L (ref 19–32)
Calcium: 7.8 mg/dL — ABNORMAL LOW (ref 8.4–10.5)
Chloride: 98 mEq/L (ref 96–112)
Creatinine, Ser: 5.33 mg/dL — ABNORMAL HIGH (ref 0.50–1.35)
GFR calc Af Amer: 15 mL/min — ABNORMAL LOW (ref >90)
GFR calc non Af Amer: 13 mL/min — ABNORMAL LOW (ref >90)
Glucose, Bld: 87 mg/dL (ref 70–99)
Phosphorus: 3.9 mg/dL (ref 2.3–4.6)
Potassium: 4.9 mEq/L (ref 3.7–5.3)
Sodium: 128 mEq/L — ABNORMAL LOW (ref 137–147)

## 2013-11-18 LAB — CMV ANTIBODY, IGG (EIA): CMV Ab - IgG: 1 U/mL — ABNORMAL HIGH (ref <0.60)

## 2013-11-18 LAB — CBC
HCT: 30.9 % — ABNORMAL LOW (ref 39.0–52.0)
Hemoglobin: 10.5 g/dL — ABNORMAL LOW (ref 13.0–17.0)
MCH: 26.2 pg (ref 26.0–34.0)
MCHC: 34 g/dL (ref 30.0–36.0)
MCV: 77.1 fL — ABNORMAL LOW (ref 78.0–100.0)
Platelets: 253 10*3/uL (ref 150–400)
RBC: 4.01 MIL/uL — ABNORMAL LOW (ref 4.22–5.81)
RDW: 15.2 % (ref 11.5–15.5)
WBC: 6.3 10*3/uL (ref 4.0–10.5)

## 2013-11-18 LAB — C3 COMPLEMENT: C3 Complement: 82 mg/dL — ABNORMAL LOW (ref 90–180)

## 2013-11-18 LAB — C4 COMPLEMENT: Complement C4, Body Fluid: 21 mg/dL (ref 10–40)

## 2013-11-18 MED ORDER — MAGNESIUM HYDROXIDE 400 MG/5ML PO SUSP: 30.0000 mL | Freq: Every day | ORAL | Status: DC | PRN

## 2013-11-18 MED ORDER — WITCH HAZEL-GLYCERIN EX PADS
MEDICATED_PAD | CUTANEOUS | Status: DC | PRN
  Administered 2013-11-18: 1 via TOPICAL
  Filled 2013-11-18: qty 100

## 2013-11-18 MED ORDER — SODIUM BICARBONATE 650 MG PO TABS: 1300.0000 mg | ORAL_TABLET | Freq: Three times a day (TID) | ORAL | Status: DC

## 2013-11-18 MED ORDER — SODIUM BICARBONATE 650 MG PO TABS
1300.0000 mg | ORAL_TABLET | Freq: Two times a day (BID) | ORAL | Status: DC
Start: 2013-11-18 — End: 2013-11-25
  Administered 2013-11-18 – 2013-11-25 (×15): 1300 mg via ORAL
  Filled 2013-11-18 (×16): qty 2

## 2013-11-18 MED ORDER — HYDROCORTISONE ACE-PRAMOXINE 1-1 % RE FOAM
1.0000 | Freq: Two times a day (BID) | RECTAL | Status: DC
  Administered 2013-11-18 – 2013-11-22 (×4): 1 via RECTAL
  Filled 2013-11-18: qty 10

## 2013-11-18 NOTE — Progress Notes (Signed)
[facility] Rounding Note   Subjective:  Pt seen and examined in AM.  He had no complications from his renal biopsy yesterday and denies flank or back pain. He denies fever or chills last night. Tmax was 99.6F. He continues to have low oxygen saturation and uses nasal canula oxygen on and off. He has painful nonbleeding external hemorrhoids and is using anusul cream and tucks pads for which are providing mild relief. He continues to make good urine by his report and denies hematuria or other urinary symptoms.  Objective:   Vital signs in last 24 hours: Filed Vitals:   11/17/13 1725 11/17/13 1750 11/17/13 1947 11/18/13 0540  BP: 146/81 142/70 124/68 143/85  Pulse: 103 102 102 84  Temp:   99.5 F (37.5 C) 98.5 F (36.9 C)  TempSrc:   Oral Oral  Resp:   20 18  Height:      Weight:    211 lb (95.709 kg)  SpO2:   91% 94%   Weight change: -6.4 oz (-0.181 kg)  Intake/Output Summary (Last 24 hours) at 11/18/13 0708 Last data filed at 11/17/13 1230  Gross per 24 hour  Intake    240 ml  Output      0 ml  Net    240 ml    Physical Exam:  Blood pressure 143/85, pulse 84, temperature 98.5 F (36.9 C), temperature source Oral, resp. rate 18, height 5\' 7"  (1.702 m), weight 211 lb (95.709 kg), SpO2 94.00%.  General: No acute distress, breathing on 2L oxygen via [place] HEENT: No oral thrush, cervical adenopathy  Cardiac: Normal rate and regular rhythm  Pulmonary: Crackles at  bases. No wheezing, ronchi, or rales GI: Ventral hernia (midline) with tenderness to palpation. Normal BS.  Extremities: No LE edema  Labs:   Recent Labs Lab 11/12/13 0004 11/13/13 0400 11/14/13 0323 11/15/13 0820 11/16/13 0415 11/17/13 0540 11/18/13 0550  NA 129* 131* 128* 127* 129* 131* 128*  K 4.7 5.3 5.2 5.0 4.3 4.9 4.9  CL 97 100 98 101 100 102 98  CO2 19 17* 17* 14* 19 20 19   GLUCOSE 81 80 86 91 98 87 87  BUN 21 19 21  24* 26* 29* 35*  CREATININE 2.35* 2.44* 2.60* 3.62* 4.53* 5.20*  5.33*  CALCIUM 7.7* 7.6* 7.5* 7.5* 7.3* 7.4* 7.8*  PHOS  --   --  2.0* 3.4 4.0 3.9 3.9     Recent Labs Lab 11/14/13 0323 11/15/13 0820  11/17/13 0540 11/17/13 1400 11/18/13 0550  AST  --  99*  --   --  70*  --   ALT  --  73*  --   --  50  --   ALKPHOS  --  75  --   --  64  --   BILITOT 0.4 0.4  --   --  0.4  --   PROT  --  5.9*  --   --  5.5*  --   ALBUMIN 1.6* 1.5*  1.5*  < > 1.3* 1.4* 1.5*  < > = values in this interval not displayed.  Recent Labs Lab 11/14/13 0323 11/15/13 0820 11/17/13 0540 11/18/13 0550  WBC 5.9 6.6 5.6 6.3  NEUTROABS 3.2 4.3  --   --   HGB 11.1* 11.6* 10.2* 10.5*  HCT 32.9* 33.4* 29.1* 30.9*  MCV 76.5* 78.8 76.4* 77.1*  PLT 136* 166 215 253     Recent Labs Lab 11/14/13 0323 11/16/13 1157  CKTOTAL 328*  --  TROPONINI  --  <0.30     Studies/Results: Nm Hepatobiliary  11/17/2013   CLINICAL DATA:  Abdominal pain, nausea, vomiting.  EXAM: NUCLEAR MEDICINE HEPATOBILIARY IMAGING  TECHNIQUE: Sequential images of the abdomen were obtained out to 60 minutes following intravenous administration of radiopharmaceutical.  RADIOPHARMACEUTICALS:  5.0 Millicurie 123XX123 Choletec  COMPARISON:  Ultrasound 11/15/2012  FINDINGS: There is prompt uptake and excretion of radiotracer by the liver. Slight filling of the gallbladder early with the late further filling of the gallbladder. No evidence of cystic duct or common bile duct obstruction.  IMPRESSION: No evidence of biliary obstruction. There is slight filling of the gallbladder early with delayed further filling. This could reflect mild chronic gallbladder disease.   Electronically Signed   By: Rolm Baptise M.D.   On: 11/17/2013 09:27   US Biopsy  11/17/2013   CLINICAL DATA:  33 year old male with acute renal failure, new diagnosis of HIV and progressive proteinuria. Random renal biopsy is warranted.  EXAM: ULTRASOUND BIOPSY CORE LIVER  Date: 11/17/2013  PROCEDURE: 1. Ultrasound-guided random biopsy of the lower  pole cortex of the right kidney Interventional Radiologist:  Criselda Peaches, MD  ANESTHESIA/SEDATION: Moderate (conscious) sedation was used. Two mg Versed, 50 mcg Fentanyl were administered intravenously. The patient's vital signs were monitored continuously by radiology nursing throughout the procedure.  Sedation Time: 19 minutes  TECHNIQUE: Informed consent was obtained from the patient following explanation of the procedure, risks, benefits and alternatives. The patient understands, agrees and consents for the procedure. All questions were addressed. A time out was performed.  The bilateral flanks were interrogated with ultrasound. The right lower renal pole appears more accessible to percutaneous biopsy. An appropriate skin entry site was selected and marked. The region was sterilely prepped and draped in the standard fashion using Betadine skin prep.  Local anesthesia was attained by infiltration with 1% lidocaine. A small dermatotomy was made. Under real-time sonographic guidance, a 15 gauge trocar needle was advanced and positioned in the retroperitoneal fat just outside the renal parenchyma. Two 16 gauge core biopsies were then obtained, again under real-time sonographic guidance. Post biopsy imaging demonstrates no perinephric hematoma or evidence of active bleeding.  The biopsy specimens were placed in saline. The patient tolerated the procedure well.  COMPLICATIONS: None immediate  IMPRESSION: Technically successful ultrasound-guided random core biopsy of the lower pole cortex of the right kidney.  Signed,  Criselda Peaches, MD  [facility]  [facility]   Electronically Signed   By: Jacqulynn Cadet M.D.   On: 11/17/2013 15:35      . azithromycin  1,200 mg Oral Weekly  . clindamycin  300 mg Oral 4 times per day  . cyclobenzaprine  10 mg Oral Once  . gi cocktail  30 mL Oral Once  . gi cocktail  30 mL Oral Once  . hydrocortisone   Rectal QID   . nystatin  5 mL Oral QID  . pantoprazole  40 mg Oral Q1200  . primaquine  30 mg Oral Daily  . sodium bicarbonate  650 mg Oral TID     I  have reviewed scheduled and prn medications.  ASSESSMENT/RECOMMENDATIONS   BACKGROUND: Mr Rhame is 33 yo man with pmh of asthma and newly diagnosed HIV/AIDS (CD4 40, VL 67K on 11/10/13) admitted on 9/11 for HCAP who we are consulted for in regards to AKI with a creatinine of 0.86 that  increased to 2.6 in the setting of IV contrast, IV/po bactrim,  vancomycin and pre-admission/admission NSAIDS. Renal function has continued to worsen despite elimination of offending agents, and pt noted to have nephrotic range proteinuria.    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 Possibly reaching plateau? 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 Pt also with hematuria during hospitalization. Continue to monitor renal function panel daily, daily weights, strict I/O's (still no output recorded but reported by pt), and avoid nephrotoxins. Pt had IR guided diagnostic percutaneous renal biopsy on 9/21 with preliminary results in 24-48 hrs. c-ANCA positive however most likely falsely positive in setting of HIV infection. Follow-up C3/C4 levels. Newly Diagnosed HIV 1 Infection/AIDS - Most likely since at least 2012 (lympocytopenia on 07/30/10 CBC). ID following. Plan to start HAART once genotype known (possibly abacavir based regimen) and renal function improves. Pt on weekly 1200 mg PO azithromycin for MAC prophylaxis. 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 CXR on 9/18 unchanged. Blood cultures, AFB cultures, and CMV Ab  pending. LDH elevated at 697, CD4 <200, and patchy inflitrates concerning for PCP PNA. Currently on clindamycin and primaquine. ID following. Pt with PaO2 of 56 on admission (9/11) with persistent hypoxia (SpO2 91%) during hospitalization requiring oxygen supplementation, consider corticosteroid administration. Check pulse oximetry while ambulating.   Hypertension - Currently hypertensive not on anti-hypertensive therapy. Continue to monitor. Metabolic Acidosis -  Resolved. No AG (11) with normal bicarbonate of 19. S/p IV bicarbonate administration on 9/19. Continue PO sodium bicarbonate 650 mg TID.  Hypotonic Euvolemic Hyponatremia - Na 128 today, 135 on admission (9/11). Chronic since >48 hr duration. No significant response with IV fluids. 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 Continue fluid restriction.  Hyperkalemia - Resolved. Restrict diet with worsening renal function Acute Microcytic Anemia -  Hg stable at 10.5, however below baseline 14 (on admission 12.2). Hemolytic labs did not suggest hemolytic anemia and pt is G6PD negative. Obtain FOBT due to recent dark stools (pt reported to have external non-bleeding hemorrhoids). Pt with hematuria on recent UA on 11/16/13.  Acute Thrombocytopenia - Resolved. Platelet count 253K today. Most likely due to recent bactrim use in setting of chronic liver disease. TTP unlikely given no schistocytes on blood smear  Chronically elevated AST/ALT -  Elevated since July 2015. Hepatitis panel negative. No reported alcohol use. Normal appearing liver on CT abdomen. Etiology most likely due to chronic HIV infection. US abdomen on 9/19 with small amount of sludge within gallbladder.   Juluis Mire, MD PGY-II IMTS Pager [phone] 11/18/2013, 7:08 AM  Attending note to follow

## 2013-11-18 NOTE — Progress Notes (Signed)
PROGRESS NOTE  Baruch Lewers Xxxscotton TWS:[id number] DOB: 1980-10-25 DOA: 11/07/2013 PCP: No PCP Per Patient  Jim Wyatt is a 33 y.o. male with a past medical history of asthma, and a recently treated community-acquired pneumonia, who presents with a chest pain, shortness of breath and abdominal pain.  The patient was seen on 9/5 and diagnosed with community-acquired pneumonia. He was discharged home with doxycycline.he comes back with persistent recurrent symptoms. 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  ID consulted for his new HIV status. On admission his creatinine was less than 1.   He developed acute renal failure and his creatinine was 2.55 on 9/15. His US renal is negative for hydronephrosis. His creatinine went up to 5.33 over the last one week despite hydration. He continues to have good urine output. Renal consulted and he underwent renal biopsy on 9/21 with results pending. .   Assessment/Plan:   Healthcare associated pneumonia:  -CT chest and abdomen reveals left pulmonary infiltrates.  -blood culture x 2 NGTD -urine legionella negative.  -S. pneumococcal antigen negative  -HIV positive - bactrim added for PCP coverage, But has to be stopped for acute renal failure. Changed to clindamycin and primaquine.   AKI Could be secondary to combination of bactrim, vancomycin and contrast and nsaids int he last few days.  Bactrim and vancomycin stopped. Hydration and stop all nephrotoxic medications.  Renal consulted and recommendations given. Creatinine continues to increase despite, stopping the medications and started him on sodium bicarb. S/p renal biopsy on 9/21 .   HIV positive -Appreciate ID  abdominal pain: thought to be  due to ibuprofen-induced gastritis. ? Mild pancreatitis, consistent with elevation of lipase.  CT abdomen and pelvis on admission did not reveal any acute pathology.  Diarrhea resolved.C diff PCR neg -Protonix 40 mg daily  - Zofran for nausea. US  abdomen ordered to further evaluate the liver. Mild elevation of the liver function tests and mild elevation of the lipase. HIDA scan negative for acute cholecystitis.    Asthma: stable. No wheezing on lung auscultation . He reports his breathing is better.  -Albuterol inhaler when necessary   Hematuria  -resolved -?UTI- culture pending- NGTD   Refusing heparin -SCDS and ambulation.   Fever: Blood cultures and UA done, CXR ordered. No rash seen. Venous duplex negative for acute dvt. CXR does not reveal any new infiltrates. US abdomen did reveal gall bladder sludge. HIDA scan ordered and negative.   Pt reports some epigastric pain. Gi cocktail ordered . On protonix. EKG does not reveal any ischemic changes, troponin ordereda nd is negative.   Code Status: full Family Communication: discussed with the mother in detail.  Disposition Plan: pending further investigation.   Consultants: Renal ID IR.    HPI/Subjective: No nausea or vomiting , constipated, stool softners and laxatives ordered.  Objective: Filed Vitals:   11/18/13 1416  BP: 128/67  Pulse: 87  Temp: 98.5 F (36.9 C)  Resp: 18    Intake/Output Summary (Last 24 hours) at 11/18/13 1727 Last data filed at 11/18/13 1300  Gross per 24 hour  Intake    480 ml  Output    800 ml  Net   -320 ml   Filed Weights   11/16/13 0525 11/17/13 0543 11/18/13 0540  Weight: 96.707 kg (213 lb 3.2 oz) 95.89 kg (211 lb 6.4 oz) 95.709 kg (211 lb)    Exam:   General:  A+Ox3, NAD  Cardiovascular: rrr  Respiratory: clear, no wheezing  Abdomen: +BS, soft  Musculoskeletal: no edema   Data Reviewed: Basic Metabolic Panel:  Recent Labs Lab 11/14/13 0323 11/15/13 0820 11/16/13 0415 11/17/13 0540 11/18/13 0550  NA 128* 127* 129* 131* 128*  K 5.2 5.0 4.3 4.9 4.9  CL 98 101 100 102 98  CO2 17* 14* '19 20 19  ' GLUCOSE 86 91 98 87 87  BUN 21 24* 26* 29* 35*  CREATININE 2.60* 3.62* 4.53* 5.20* 5.33*  CALCIUM 7.5* 7.5*  7.3* 7.4* 7.8*  PHOS 2.0* 3.4 4.0 3.9 3.9   Liver Function Tests:  Recent Labs Lab 11/14/13 0323 11/15/13 0820 11/16/13 0415 11/17/13 0540 11/17/13 1400 11/18/13 0550  AST  --  99*  --   --  70*  --   ALT  --  73*  --   --  50  --   ALKPHOS  --  75  --   --  64  --   BILITOT 0.4 0.4  --   --  0.4  --   PROT  --  5.9*  --   --  5.5*  --   ALBUMIN 1.6* 1.5*  1.5* 1.3* 1.3* 1.4* 1.5*    Recent Labs Lab 11/15/13 0820 11/16/13 1157 11/17/13 0540  LIPASE 81* 73* 51   No results found for this basename: AMMONIA,  in the last 168 hours CBC:  Recent Labs Lab 11/13/13 1130 11/14/13 0323 11/15/13 0820 11/17/13 0540 11/18/13 0550  WBC 5.0 5.9 6.6 5.6 6.3  NEUTROABS  --  3.2 4.3  --   --   HGB 11.4* 11.1* 11.6* 10.2* 10.5*  HCT 33.2* 32.9* 33.4* 29.1* 30.9*  MCV 78.3 76.5* 78.8 76.4* 77.1*  PLT 117* 136* 166 215 253   Cardiac Enzymes:  Recent Labs Lab 11/14/13 0323 11/16/13 1157  CKTOTAL 328*  --   TROPONINI  --  <0.30   BNP (last 3 results)  Recent Labs  11/07/13 0855  PROBNP 33.5   CBG: No results found for this basename: GLUCAP,  in the last 168 hours  Recent Results (from the past 240 hour(s))  RESPIRATORY VIRUS PANEL     Status: None   Collection Time    11/09/13  3:19 PM      Result Value Ref Range Status   Source - RVPAN NASAL SWAB   Corrected   Comment: CORRECTED ON 09/14 AT 2100: PREVIOUSLY REPORTED AS NASAL SWAB   Respiratory Syncytial Virus A NOT DETECTED   Final   Respiratory Syncytial Virus B NOT DETECTED   Final   Influenza A NOT DETECTED   Final   Influenza B NOT DETECTED   Final   Parainfluenza 1 NOT DETECTED   Final   Parainfluenza 2 NOT DETECTED   Final   Parainfluenza 3 NOT DETECTED   Final   Metapneumovirus NOT DETECTED   Final   Rhinovirus NOT DETECTED   Final   Adenovirus NOT DETECTED   Final   Influenza A H1 NOT DETECTED   Final   Influenza A H3 NOT DETECTED   Final   Comment: (NOTE)           Normal Reference Range for  each Analyte: NOT DETECTED     Testing performed using the Luminex xTAG Respiratory Viral Panel test     kit.     The analytical performance characteristics of this assay have been     determined by [facility].  The modifications have not been     cleared or  approved by the FDA. This assay has been validated pursuant     to the CLIA regulations and is used for clinical purposes.     Performed at [place], BLOOD (ROUTINE X 2)     Status: None   Collection Time    11/14/13 12:30 PM      Result Value Ref Range Status   Specimen Description BLOOD RIGHT ARM   Final   Special Requests BOTTLES DRAWN AEROBIC ONLY 10CC   Final   Culture  Setup Time     Final   Value: 11/14/2013 21:41     Performed at [facility]   Culture     Final   Value:        BLOOD CULTURE RECEIVED NO GROWTH TO DATE CULTURE WILL BE HELD FOR 5 DAYS BEFORE ISSUING A FINAL NEGATIVE REPORT     Performed at [facility]   Report Status PENDING   Incomplete  URINE CULTURE     Status: None   Collection Time    11/14/13  5:44 PM      Result Value Ref Range Status   Specimen Description URINE, RANDOM   Final   Special Requests Normal   Final   Culture  Setup Time     Final   Value: 11/14/2013 18:24     Performed at [facility]     Final   Value: NO GROWTH     Performed at [facility]   Culture     Final   Value: NO GROWTH     Performed at [facility]   Report Status 11/15/2013 FINAL   Final  AFB CULTURE, BLOOD     Status: None   Collection Time    11/15/13  8:40 PM      Result Value Ref Range Status   Specimen Description BLOOD RIGHT ARM   Final   Special Requests 5CC AFB   Final   Culture     Final   Value: CULTURE WILL BE EXAMINED FOR 6 WEEKS BEFORE ISSUING A FINAL REPORT     Performed at [facility]   Report Status PENDING   Incomplete     Studies: Nm Hepatobiliary  11/17/2013   CLINICAL DATA:  Abdominal pain,  nausea, vomiting.  EXAM: NUCLEAR MEDICINE HEPATOBILIARY IMAGING  TECHNIQUE: Sequential images of the abdomen were obtained out to 60 minutes following intravenous administration of radiopharmaceutical.  RADIOPHARMACEUTICALS:  5.0 Millicurie XV-40G Choletec  COMPARISON:  Ultrasound 11/15/2012  FINDINGS: There is prompt uptake and excretion of radiotracer by the liver. Slight filling of the gallbladder early with the late further filling of the gallbladder. No evidence of cystic duct or common bile duct obstruction.  IMPRESSION: No evidence of biliary obstruction. There is slight filling of the gallbladder early with delayed further filling. This could reflect mild chronic gallbladder disease.   Electronically Signed   By: Rolm Baptise M.D.   On: 11/17/2013 09:27   US Biopsy  11/17/2013   CLINICAL DATA:  33 year old male with acute renal failure, new diagnosis of HIV and progressive proteinuria. Random renal biopsy is warranted.  EXAM: ULTRASOUND BIOPSY CORE LIVER  Date: 11/17/2013  PROCEDURE: 1. Ultrasound-guided random biopsy of the lower pole cortex of the right kidney Interventional Radiologist:  Criselda Peaches, MD  ANESTHESIA/SEDATION: Moderate (conscious) sedation was used. Two mg Versed, 50 mcg Fentanyl were administered intravenously. The patient's vital signs were monitored continuously  by radiology nursing throughout the procedure.  Sedation Time: 19 minutes  TECHNIQUE: Informed consent was obtained from the patient following explanation of the procedure, risks, benefits and alternatives. The patient understands, agrees and consents for the procedure. All questions were addressed. A time out was performed.  The bilateral flanks were interrogated with ultrasound. The right lower renal pole appears more accessible to percutaneous biopsy. An appropriate skin entry site was selected and marked. The region was sterilely prepped and draped in the standard fashion using Betadine skin prep.  Local anesthesia  was attained by infiltration with 1% lidocaine. A small dermatotomy was made. Under real-time sonographic guidance, a 15 gauge trocar needle was advanced and positioned in the retroperitoneal fat just outside the renal parenchyma. Two 16 gauge core biopsies were then obtained, again under real-time sonographic guidance. Post biopsy imaging demonstrates no perinephric hematoma or evidence of active bleeding.  The biopsy specimens were placed in saline. The patient tolerated the procedure well.  COMPLICATIONS: None immediate  IMPRESSION: Technically successful ultrasound-guided random core biopsy of the lower pole cortex of the right kidney.  Signed,  Criselda Peaches, MD  [facility]  [facility]   Electronically Signed   By: Jacqulynn Cadet M.D.   On: 11/17/2013 15:35    Scheduled Meds: . azithromycin  1,200 mg Oral Weekly  . clindamycin  300 mg Oral 4 times per day  . cyclobenzaprine  10 mg Oral Once  . hydrocortisone   Rectal QID  . hydrocortisone-pramoxine  1 applicator Rectal BID  . nystatin  5 mL Oral QID  . pantoprazole  40 mg Oral Q1200  . primaquine  30 mg Oral Daily  . sodium bicarbonate  1,300 mg Oral BID   Continuous Infusions:   Antibiotics Given (last 72 hours)   Date/Time Action Medication Dose   11/15/13 1808 Given   clindamycin (CLEOCIN) capsule 300 mg 300 mg   11/16/13 0010 Given   clindamycin (CLEOCIN) capsule 300 mg 300 mg   11/16/13 0524 Given   clindamycin (CLEOCIN) capsule 300 mg 300 mg   11/16/13 3474 Given   primaquine tablet 30 mg 30 mg   11/16/13 1152 Given   clindamycin (CLEOCIN) capsule 300 mg 300 mg   11/16/13 1624 Given   clindamycin (CLEOCIN) capsule 300 mg 300 mg   11/16/13 2309 Given   clindamycin (CLEOCIN) capsule 300 mg 300 mg   11/17/13 0948 Given   clindamycin (CLEOCIN) capsule 300 mg 300 mg   11/17/13 2595 Given   primaquine tablet 30 mg 30 mg   11/17/13 1812 Given   clindamycin (CLEOCIN)  capsule 300 mg 300 mg   11/18/13 0120 Given   clindamycin (CLEOCIN) capsule 300 mg 300 mg   11/18/13 6387 Given   clindamycin (CLEOCIN) capsule 300 mg 300 mg   11/18/13 1045 Given   clindamycin (CLEOCIN) capsule 300 mg 300 mg   11/18/13 1045 Given   primaquine tablet 30 mg 30 mg      Principal Problem:   Pneumonia Active Problems:   Asthma   Elevated liver enzymes   History of shingles   Obesity   AKI (acute kidney injury)   Human immunodeficiency virus (HIV) disease    Time spent: 25 min    [facility] Pager [phone]. If 7PM-7AM, please contact night-coverage at [email or web address], password [facility] 11/18/2013, 5:27 PM  LOS: 11 days

## 2013-11-18 NOTE — Progress Notes (Signed)
INFECTIOUS DISEASE PROGRESS NOTE  ID: Jim Wyatt is a 33 y.o. male with  Principal Problem:   Pneumonia Active Problems:   Asthma   Elevated liver enzymes   History of shingles   Obesity   AKI (acute kidney injury)   Human immunodeficiency virus (HIV) disease  Subjective: Feels well.   Abtx:  Anti-infectives   Start     Dose/Rate Route Frequency Ordered Stop   11/13/13 1100  azithromycin (ZITHROMAX) tablet 1,200 mg     1,200 mg Oral Weekly 11/13/13 0950     11/13/13 0600  levofloxacin (LEVAQUIN) tablet 750 mg  Status:  Discontinued     750 mg Oral Every 48 hours 11/11/13 1306 11/14/13 1050   11/12/13 1800  clindamycin (CLEOCIN) capsule 300 mg     300 mg Oral 4 times per day 11/12/13 1458 11/29/13 1759   11/12/13 1600  primaquine tablet 30 mg     30 mg Oral Daily 11/12/13 1458 11/29/13 0959   11/11/13 1600  sulfamethoxazole-trimethoprim (BACTRIM DS) 800-160 MG per tablet 2 tablet  Status:  Discontinued     2 tablet Oral 3 times daily 11/11/13 1011 11/12/13 1458   11/11/13 0500  levofloxacin (LEVAQUIN) tablet 750 mg  Status:  Discontinued     750 mg Oral Every 24 hours 11/10/13 1042 11/11/13 1306   11/10/13 1200  sulfamethoxazole-trimethoprim (BACTRIM DS) 800-160 MG per tablet 2 tablet  Status:  Discontinued     2 tablet Oral 4 times daily 11/10/13 1100 11/11/13 1011   11/10/13 0200  sulfamethoxazole-trimethoprim (BACTRIM) 340 mg of trimethoprim in dextrose 5 % 500 mL IVPB  Status:  Discontinued     340 mg of trimethoprim 347.5 mL/hr over 90 Minutes Intravenous Every 6 hours 11/09/13 2056 11/10/13 1100   11/09/13 1800  sulfamethoxazole-trimethoprim (BACTRIM) 340 mg of trimethoprim in dextrose 5 % 500 mL IVPB  Status:  Discontinued     340 mg of trimethoprim 347.5 mL/hr over 90 Minutes Intravenous 4 times per day 11/09/13 1637 11/09/13 2056   11/07/13 0800  vancomycin (VANCOCIN) IVPB 1000 mg/200 mL premix  Status:  Discontinued     1,000 mg 200 mL/hr over 60 Minutes  Intravenous Every 8 hours 11/07/13 0542 11/09/13 1603   11/07/13 0600  levofloxacin (LEVAQUIN) IVPB 750 mg  Status:  Discontinued     750 mg 100 mL/hr over 90 Minutes Intravenous Every 24 hours 11/07/13 0539 11/10/13 1042   11/07/13 0545  vancomycin (VANCOCIN) IVPB 1000 mg/200 mL premix  Status:  Discontinued     1,000 mg 200 mL/hr over 60 Minutes Intravenous  Once 11/07/13 0539 11/07/13 0557   11/07/13 0030  levofloxacin (LEVAQUIN) IVPB 750 mg     750 mg 100 mL/hr over 90 Minutes Intravenous  Once 11/07/13 0029 11/07/13 0235   11/07/13 0030  vancomycin (VANCOCIN) IVPB 1000 mg/200 mL premix     1,000 mg 200 mL/hr over 60 Minutes Intravenous  Once 11/07/13 0029 11/07/13 0405      Medications:  Scheduled: . azithromycin  1,200 mg Oral Weekly  . clindamycin  300 mg Oral 4 times per day  . cyclobenzaprine  10 mg Oral Once  . hydrocortisone   Rectal QID  . hydrocortisone-pramoxine  1 applicator Rectal BID  . nystatin  5 mL Oral QID  . pantoprazole  40 mg Oral Q1200  . primaquine  30 mg Oral Daily  . sodium bicarbonate  1,300 mg Oral BID    Objective: Vital signs in last  24 hours: Temp:  [98.5 F (36.9 C)-99.9 F (37.7 C)] 98.5 F (36.9 C) (09/22 0540) Pulse Rate:  [84-104] 84 (09/22 0540) Resp:  [18-24] 18 (09/22 0540) BP: (124-153)/(67-86) 143/85 mmHg (09/22 0540) SpO2:  [90 %-100 %] 94 % (09/22 0540) Weight:  [95.709 kg (211 lb)] 95.709 kg (211 lb) (09/22 0540)   General appearance: alert, cooperative and no distress Resp: rales bibasilar Cardio: regular rate and rhythm GI: normal findings: bowel sounds normal and soft, non-tender  Lab Results  Recent Labs  11/17/13 0540 11/18/13 0550  WBC 5.6 6.3  HGB 10.2* 10.5*  HCT 29.1* 30.9*  NA 131* 128*  K 4.9 4.9  CL 102 98  CO2 20 19  BUN 29* 35*  CREATININE 5.20* 5.33*   Liver Panel  Recent Labs  11/17/13 1400 11/18/13 0550  PROT 5.5*  --   ALBUMIN 1.4* 1.5*  AST 70*  --   ALT 50  --   ALKPHOS 64  --     BILITOT 0.4  --   BILIDIR <0.2  --   IBILI NOT CALCULATED  --    Sedimentation Rate No results found for this basename: ESRSEDRATE,  in the last 72 hours C-Reactive Protein No results found for this basename: CRP,  in the last 72 hours  Microbiology: Recent Results (from the past 240 hour(s))  RESPIRATORY VIRUS PANEL     Status: None   Collection Time    11/09/13  3:19 PM      Result Value Ref Range Status   Source - RVPAN NASAL SWAB   Corrected   Comment: CORRECTED ON 09/14 AT 2100: PREVIOUSLY REPORTED AS NASAL SWAB   Respiratory Syncytial Virus A NOT DETECTED   Final   Respiratory Syncytial Virus B NOT DETECTED   Final   Influenza A NOT DETECTED   Final   Influenza B NOT DETECTED   Final   Parainfluenza 1 NOT DETECTED   Final   Parainfluenza 2 NOT DETECTED   Final   Parainfluenza 3 NOT DETECTED   Final   Metapneumovirus NOT DETECTED   Final   Rhinovirus NOT DETECTED   Final   Adenovirus NOT DETECTED   Final   Influenza A H1 NOT DETECTED   Final   Influenza A H3 NOT DETECTED   Final   Comment: (NOTE)           Normal Reference Range for each Analyte: NOT DETECTED     Testing performed using the Luminex xTAG Respiratory Viral Panel test     kit.     The analytical performance characteristics of this assay have been     determined by [facility].  The modifications have not been     cleared or approved by the FDA. This assay has been validated pursuant     to the CLIA regulations and is used for clinical purposes.     Performed at [place], BLOOD (ROUTINE X 2)     Status: None   Collection Time    11/14/13 12:30 PM      Result Value Ref Range Status   Specimen Description BLOOD RIGHT ARM   Final   Special Requests BOTTLES DRAWN AEROBIC ONLY 10CC   Final   Culture  Setup Time     Final   Value: 11/14/2013 21:41     Performed at [facility]   Culture     Final   Value:  BLOOD CULTURE RECEIVED NO GROWTH TO DATE CULTURE WILL BE  HELD FOR 5 DAYS BEFORE ISSUING A FINAL NEGATIVE REPORT     Performed at [facility]   Report Status PENDING   Incomplete  URINE CULTURE     Status: None   Collection Time    11/14/13  5:44 PM      Result Value Ref Range Status   Specimen Description URINE, RANDOM   Final   Special Requests Normal   Final   Culture  Setup Time     Final   Value: 11/14/2013 18:24     Performed at [place]     Final   Value: NO GROWTH     Performed at [facility]   Culture     Final   Value: NO GROWTH     Performed at [facility]   Report Status 11/15/2013 FINAL   Final  AFB CULTURE, BLOOD     Status: None   Collection Time    11/15/13  8:40 PM      Result Value Ref Range Status   Specimen Description BLOOD RIGHT ARM   Final   Special Requests 5CC AFB   Final   Culture     Final   Value: CULTURE WILL BE EXAMINED FOR 6 WEEKS BEFORE ISSUING A FINAL REPORT     Performed at [facility]   Report Status PENDING   Incomplete    Studies/Results: Nm Hepatobiliary  11/17/2013   CLINICAL DATA:  Abdominal pain, nausea, vomiting.  EXAM: NUCLEAR MEDICINE HEPATOBILIARY IMAGING  TECHNIQUE: Sequential images of the abdomen were obtained out to 60 minutes following intravenous administration of radiopharmaceutical.  RADIOPHARMACEUTICALS:  5.0 Millicurie GQ-67Y Choletec  COMPARISON:  Ultrasound 11/15/2012  FINDINGS: There is prompt uptake and excretion of radiotracer by the liver. Slight filling of the gallbladder early with the late further filling of the gallbladder. No evidence of cystic duct or common bile duct obstruction.  IMPRESSION: No evidence of biliary obstruction. There is slight filling of the gallbladder early with delayed further filling. This could reflect mild chronic gallbladder disease.   Electronically Signed   By: Rolm Baptise M.D.   On: 11/17/2013 09:27   US Biopsy  11/17/2013   CLINICAL DATA:  33 year old male with acute renal  failure, new diagnosis of HIV and progressive proteinuria. Random renal biopsy is warranted.  EXAM: ULTRASOUND BIOPSY CORE LIVER  Date: 11/17/2013  PROCEDURE: 1. Ultrasound-guided random biopsy of the lower pole cortex of the right kidney Interventional Radiologist:  Criselda Peaches, MD  ANESTHESIA/SEDATION: Moderate (conscious) sedation was used. Two mg Versed, 50 mcg Fentanyl were administered intravenously. The patient's vital signs were monitored continuously by radiology nursing throughout the procedure.  Sedation Time: 19 minutes  TECHNIQUE: Informed consent was obtained from the patient following explanation of the procedure, risks, benefits and alternatives. The patient understands, agrees and consents for the procedure. All questions were addressed. A time out was performed.  The bilateral flanks were interrogated with ultrasound. The right lower renal pole appears more accessible to percutaneous biopsy. An appropriate skin entry site was selected and marked. The region was sterilely prepped and draped in the standard fashion using Betadine skin prep.  Local anesthesia was attained by infiltration with 1% lidocaine. A small dermatotomy was made. Under real-time sonographic guidance, a 15 gauge trocar needle was advanced and positioned in the retroperitoneal fat just outside the renal parenchyma. Two 16 gauge  core biopsies were then obtained, again under real-time sonographic guidance. Post biopsy imaging demonstrates no perinephric hematoma or evidence of active bleeding.  The biopsy specimens were placed in saline. The patient tolerated the procedure well.  COMPLICATIONS: None immediate  IMPRESSION: Technically successful ultrasound-guided random core biopsy of the lower pole cortex of the right kidney.  Signed,  Criselda Peaches, MD  [facility]  [facility]   Electronically Signed   By: Jacqulynn Cadet M.D.   On: 11/17/2013 15:35      Assessment/Plan: AIDS  Pneumonia  ARF   Fever w/u negative (BCx, UCx, doppler)  His CMV IgM is elevated, await PCR prior to starting valcyte.  Await renal Bx result  Await his HIV genotype- would like to see if his Cr improves prior to starting ART.   Total days of antibiotics:  9/16 clinda/primaquine  9/11 levaquin 9/18  9/13 bactrim 9/16          [facility] Infectious [facility] (pager) [phone] [email or web address] 11/18/2013, 12:00 PM  LOS: 11 days

## 2013-11-18 NOTE — Progress Notes (Signed)
Pt ambulated approx. 230ft no distress no oxygen needed. o2 sats while ambulating were 96%.

## 2013-11-18 NOTE — Progress Notes (Signed)
SATURATION QUALIFICATIONS: (This note is used to comply with regulatory documentation for home oxygen)  Patient Saturations on Room Air at Rest = 99%  Patient Saturations on Room Air while Ambulating = 96%  Patient Saturations on 0 Liters of oxygen while Ambulating = 0%  Please briefly explain why patient needs home oxygen:pt did not require oxygen to ambulate or while at rest

## 2013-11-18 NOTE — Progress Notes (Signed)
I saw the patient and agree with the above assessment and plan.     Patient status post renal biopsy tomorrow which was without complication. He feels well today and has no complaints. No hematuria postbiopsy. Serologic workup has been negative to this point. I will not know the preliminary findings on the biopsy until least tomorrow afternoon.  Serum creatinine is relatively stable compared to yesterday. Hopefully this is the beginning of some recovery. Urine output is not strictly recorded however it sounds as if he is making a reasonable amount.  He has mild persistent hyponatremia and we will place a 1.5 L fluid restriction today.

## 2013-11-19 ENCOUNTER — Inpatient Hospital Stay (HOSPITAL_COMMUNITY): Payer: Self-pay

## 2013-11-19 LAB — RENAL FUNCTION PANEL
Albumin: 1.3 g/dL — ABNORMAL LOW (ref 3.5–5.2)
Anion gap: 9 (ref 5–15)
BUN: 34 mg/dL — ABNORMAL HIGH (ref 6–23)
CO2: 21 mEq/L (ref 19–32)
Calcium: 7.6 mg/dL — ABNORMAL LOW (ref 8.4–10.5)
Chloride: 99 mEq/L (ref 96–112)
Creatinine, Ser: 5.11 mg/dL — ABNORMAL HIGH (ref 0.50–1.35)
GFR calc Af Amer: 16 mL/min — ABNORMAL LOW (ref >90)
GFR calc non Af Amer: 14 mL/min — ABNORMAL LOW (ref >90)
Glucose, Bld: 86 mg/dL (ref 70–99)
Phosphorus: 4.4 mg/dL (ref 2.3–4.6)
Potassium: 4.4 mEq/L (ref 3.7–5.3)
Sodium: 129 mEq/L — ABNORMAL LOW (ref 137–147)

## 2013-11-19 LAB — CMV (CYTOMEGALOVIRUS) DNA ULTRAQUANT, PCR: CMV DNA Quant: 89011 copies/mL — ABNORMAL HIGH

## 2013-11-19 MED ORDER — INFLUENZA VAC SPLIT QUAD 0.5 ML IM SUSY
0.5000 mL | PREFILLED_SYRINGE | INTRAMUSCULAR | Status: AC
  Administered 2013-11-20: 0.5 mL via INTRAMUSCULAR
  Filled 2013-11-19 (×2): qty 0.5

## 2013-11-19 MED ORDER — PNEUMOCOCCAL VAC POLYVALENT 25 MCG/0.5ML IJ INJ
0.5000 mL | INJECTION | INTRAMUSCULAR | Status: AC
  Administered 2013-11-20: 0.5 mL via INTRAMUSCULAR
  Filled 2013-11-19: qty 0.5

## 2013-11-19 NOTE — Progress Notes (Signed)
Patient is complaining of 10 out of 10 pain due to hemorrhoids.

## 2013-11-19 NOTE — Progress Notes (Signed)
PROGRESS NOTE  Kaci Lassley Xxxscotton [id number] DOB: [birth year] DOA: 11/07/2013 PCP: No PCP Per Patient  Jim Wyatt is a 33 y.o. male with a past medical history of asthma, and a recently treated community-acquired pneumonia, who presents with a chest pain, shortness of breath and abdominal pain.  The patient was seen on 9/5 and diagnosed with community-acquired pneumonia. He was discharged home with doxycycline.he comes back with persistent recurrent symptoms. 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  ID consulted for his new HIV status. On admission his creatinine was less than 1.   He developed acute renal failure and his creatinine was 2.55 on 9/15. His US renal is negative for hydronephrosis. His creatinine went up to 5.33 over the last one week despite hydration. He continues to have good urine output. Renal consulted and he underwent renal biopsy on 9/21 with results pending. .   Assessment/Plan:   Healthcare associated pneumonia:  -CT chest and abdomen reveals left pulmonary infiltrates.  -blood culture x 2 NGTD -urine legionella negative.  -S. pneumococcal antigen negative  -HIV positive -Continue clindamycin and primaquine -repeat CXR, wean O2.   AKI -due to combination of bactrim, vancomycin and contrast and nsaids int he last few days.  -Bactrim and vancomycin stopped  -Renal following, urine output good, -S/p renal biopsy on 9/21 .   HIV/AIDS  -Appreciate IDinput -await HIV genotype  abdominal pain: thought to be  due to ibuprofen-induced gastritis. ? Mild pancreatitis, consistent with elevation of lipase.  CT abdomen and pelvis on admission did not reveal any acute pathology.  -improving -Diarrhea resolved.C diff PCR neg -Protonix 40 mg daily  -US abdomen : some Gb sludge  Asthma: stable. No wheezing on lung auscultation . He reports his breathing is better.  -Albuterol inhaler when necessary   Fever: -improving -on Abx for pneumonia -CMV titres  positive, start Antivirals per ID  Hemorrhoids -continue anusol, hydrocortisone cream  Code Status: full Family Communication: discussed with the mother in detail.  Disposition Plan: pending further investigation.   Consultants: Renal ID IR  HPI/Subjective: No nausea or vomiting , constipated, stool softners and laxatives ordered.   Objective: Filed Vitals:   11/19/13 0455  BP: 130/77  Pulse: 82  Temp: 97.9 F (36.6 C)  Resp: 18    Intake/Output Summary (Last 24 hours) at 11/19/13 1752 Last data filed at 11/19/13 0455  Gross per 24 hour  Intake      0 ml  Output   1000 ml  Net  -1000 ml   Filed Weights   11/16/13 0525 11/17/13 0543 11/18/13 0540  Weight: 96.707 kg (213 lb 3.2 oz) 95.89 kg (211 lb 6.4 oz) 95.709 kg (211 lb)    Exam:   General:  A+Ox3, NAD  Cardiovascular: rrr  Respiratory: clear, no wheezing  Abdomen: +BS, soft  Musculoskeletal: no edema   Data Reviewed: Basic Metabolic Panel:  Recent Labs Lab 11/15/13 0820 11/16/13 0415 11/17/13 0540 11/18/13 0550 11/19/13 0557  NA 127* 129* 131* 128* 129*  K 5.0 4.3 4.9 4.9 4.4  CL 101 100 102 98 99  CO2 14* 19 20 19 21   GLUCOSE 91 98 87 87 86  BUN 24* 26* 29* 35* 34*  CREATININE 3.62* 4.53* 5.20* 5.33* 5.11*  CALCIUM 7.5* 7.3* 7.4* 7.8* 7.6*  PHOS 3.4 4.0 3.9 3.9 4.4   Liver Function Tests:  Recent Labs Lab 11/14/13 0323 11/15/13 0820 11/16/13 0415 11/17/13 0540 11/17/13 1400 11/18/13 0550 11/19/13 0557  AST  --  99*  --   --  70*  --   --   ALT  --  73*  --   --  50  --   --   ALKPHOS  --  75  --   --  64  --   --   BILITOT 0.4 0.4  --   --  0.4  --   --   PROT  --  5.9*  --   --  5.5*  --   --   ALBUMIN 1.6* 1.5*  1.5* 1.3* 1.3* 1.4* 1.5* 1.3*    Recent Labs Lab 11/15/13 0820 11/16/13 1157 11/17/13 0540  LIPASE 81* 73* 51   No results found for this basename: AMMONIA,  in the last 168 hours CBC:  Recent Labs Lab 11/13/13 1130 11/14/13 0323 11/15/13 0820  11/17/13 0540 11/18/13 0550  WBC 5.0 5.9 6.6 5.6 6.3  NEUTROABS  --  3.2 4.3  --   --   HGB 11.4* 11.1* 11.6* 10.2* 10.5*  HCT 33.2* 32.9* 33.4* 29.1* 30.9*  MCV 78.3 76.5* 78.8 76.4* 77.1*  PLT 117* 136* 166 215 253   Cardiac Enzymes:  Recent Labs Lab 11/14/13 0323 11/16/13 1157  CKTOTAL 328*  --   TROPONINI  --  <0.30   BNP (last 3 results)  Recent Labs  11/07/13 0855  PROBNP 33.5   CBG: No results found for this basename: GLUCAP,  in the last 168 hours  Recent Results (from the past 240 hour(s))  CULTURE, BLOOD (ROUTINE X 2)     Status: None   Collection Time    11/14/13 12:30 PM      Result Value Ref Range Status   Specimen Description BLOOD RIGHT ARM   Final   Special Requests BOTTLES DRAWN AEROBIC ONLY 10CC   Final   Culture  Setup Time     Final   Value: 11/14/2013 21:41     Performed at [facility]   Culture     Final   Value:        BLOOD CULTURE RECEIVED NO GROWTH TO DATE CULTURE WILL BE HELD FOR 5 DAYS BEFORE ISSUING A FINAL NEGATIVE REPORT     Performed at [facility]   Report Status PENDING   Incomplete  URINE CULTURE     Status: None   Collection Time    11/14/13  5:44 PM      Result Value Ref Range Status   Specimen Description URINE, RANDOM   Final   Special Requests Normal   Final   Culture  Setup Time     Final   Value: 11/14/2013 18:24     Performed at [facility]     Final   Value: NO GROWTH     Performed at [facility]   Culture     Final   Value: NO GROWTH     Performed at [facility]   Report Status 11/15/2013 FINAL   Final  AFB CULTURE, BLOOD     Status: None   Collection Time    11/15/13  8:40 PM      Result Value Ref Range Status   Specimen Description BLOOD RIGHT ARM   Final   Special Requests 5CC AFB   Final   Culture     Final   Value: CULTURE WILL BE EXAMINED FOR 6 WEEKS BEFORE ISSUING A FINAL REPORT     Performed at [facility]  Report Status PENDING    Incomplete     Studies: Dg Chest 2 View  11/19/2013   CLINICAL DATA:  Followup hypoxia.  EXAM: CHEST  2 VIEW  COMPARISON:  11/14/2013.  FINDINGS: Improved perihilar opacities, not yet quite clear consistent with resolving pneumonia. No focal consolidation. Small RIGHT pleural effusion persists. Unchanged cardiomediastinal silhouette.  IMPRESSION: Improved aeration.   Electronically Signed   By: Rolla Flatten M.D.   On: 11/19/2013 12:43    Scheduled Meds: . azithromycin  1,200 mg Oral Weekly  . clindamycin  300 mg Oral 4 times per day  . cyclobenzaprine  10 mg Oral Once  . hydrocortisone   Rectal QID  . hydrocortisone-pramoxine  1 applicator Rectal BID  . nystatin  5 mL Oral QID  . pantoprazole  40 mg Oral Q1200  . primaquine  30 mg Oral Daily  . sodium bicarbonate  1,300 mg Oral BID   Continuous Infusions:   Antibiotics Given (last 72 hours)   Date/Time Action Medication Dose   11/16/13 2309 Given   clindamycin (CLEOCIN) capsule 300 mg 300 mg   11/17/13 0948 Given   clindamycin (CLEOCIN) capsule 300 mg 300 mg   11/17/13 0948 Given   primaquine tablet 30 mg 30 mg   11/17/13 1812 Given   clindamycin (CLEOCIN) capsule 300 mg 300 mg   11/18/13 0120 Given   clindamycin (CLEOCIN) capsule 300 mg 300 mg   11/18/13 0521 Given   clindamycin (CLEOCIN) capsule 300 mg 300 mg   11/18/13 1045 Given   clindamycin (CLEOCIN) capsule 300 mg 300 mg   11/18/13 1045 Given   primaquine tablet 30 mg 30 mg   11/18/13 1822 Given   clindamycin (CLEOCIN) capsule 300 mg 300 mg   11/18/13 2207 Given   clindamycin (CLEOCIN) capsule 300 mg 300 mg   11/19/13 [id number] Given   clindamycin (CLEOCIN) capsule 300 mg 300 mg   11/19/13 1106 Given   primaquine tablet 30 mg 30 mg   11/19/13 1107 Given   clindamycin (CLEOCIN) capsule 300 mg 300 mg      Principal Problem:   Pneumonia Active Problems:   Asthma   Elevated liver enzymes   History of shingles   Obesity   AKI (acute kidney injury)   Human  immunodeficiency virus (HIV) disease    Time spent: 20 min    [facility] Pager [phone]. If 7PM-7AM, please contact night-coverage at [email or web address], password [facility] 11/19/2013, 5:52 PM  LOS: 12 days

## 2013-11-19 NOTE — Progress Notes (Signed)
[facility] Rounding Note   Subjective:  Pt seen and examined in AM.  No acute events overnight.  He denies fever or chills last night. Tmax was 97.71F last night.  He continues to make good urine output  and denies hematuria or other urinary symptoms. He reports his breathing is improved and is on and off nasal canula oxygen. He continues to have painful nonbleeding external hemorrhoids. No nausea, vomiting, or abdominal pain.   Objective:   Vital signs in last 24 hours: Filed Vitals:   11/18/13 0540 11/18/13 1416 11/18/13 1957 11/19/13 0455  BP: 143/85 128/67 138/84 130/77  Pulse: 84 87 92 82  Temp: 98.5 F (36.9 C) 98.5 F (36.9 C) 99 F (37.2 C) 97.9 F (36.6 C)  TempSrc: Oral Oral Oral Oral  Resp: 18 18 18 18   Height:      Weight: 211 lb (95.709 kg)     SpO2: 94% 94% 91% 95%   Weight change:   Intake/Output Summary (Last 24 hours) at 11/19/13 0707 Last data filed at 11/19/13 0455  Gross per 24 hour  Intake    480 ml  Output   1800 ml  Net  -1320 ml    Physical Exam:  Blood pressure 130/77, pulse 82, temperature 97.9 F (36.6 C), temperature source Oral, resp. rate 18, height 5\' 7"  (1.702 m), weight 211 lb (95.709 kg), SpO2 95.00%.  General: No acute distress, breathing on oxygen via [place] HEENT: No oral thrush, cervical adenopathy  Cardiac: Normal rate and regular rhythm  Pulmonary: Crackles at  bases. No wheezing, ronchi, or rales GI: Ventral hernia (midline) with tenderness to palpation. Normal BS.  Extremities: No LE edema  Labs:   Recent Labs Lab 11/13/13 0400 11/14/13 0323 11/15/13 0820 11/16/13 0415 11/17/13 0540 11/18/13 0550 11/19/13 0557  NA 131* 128* 127* 129* 131* 128* 129*  K 5.3 5.2 5.0 4.3 4.9 4.9 4.4  CL 100 98 101 100 102 98 99  CO2 17* 17* 14* 19 20 19 21   GLUCOSE 80 86 91 98 87 87 86  BUN 19 21 24* 26* 29* 35* 34*  CREATININE 2.44* 2.60* 3.62* 4.53* 5.20* 5.33* 5.11*  CALCIUM 7.6* 7.5* 7.5* 7.3* 7.4* 7.8* 7.6*  PHOS  --   2.0* 3.4 4.0 3.9 3.9 4.4     Recent Labs Lab 11/14/13 0323 11/15/13 0820  11/17/13 1400 11/18/13 0550 11/19/13 0557  AST  --  99*  --  70*  --   --   ALT  --  73*  --  50  --   --   ALKPHOS  --  75  --  64  --   --   BILITOT 0.4 0.4  --  0.4  --   --   PROT  --  5.9*  --  5.5*  --   --   ALBUMIN 1.6* 1.5*  1.5*  < > 1.4* 1.5* 1.3*  < > = values in this interval not displayed.  Recent Labs Lab 11/14/13 0323 11/15/13 0820 11/17/13 0540 11/18/13 0550  WBC 5.9 6.6 5.6 6.3  NEUTROABS 3.2 4.3  --   --   HGB 11.1* 11.6* 10.2* 10.5*  HCT 32.9* 33.4* 29.1* 30.9*  MCV 76.5* 78.8 76.4* 77.1*  PLT 136* 166 215 253     Recent Labs Lab 11/14/13 0323 11/16/13 1157  CKTOTAL 328*  --   TROPONINI  --  <0.30     Studies/Results: Nm Hepatobiliary  11/17/2013   CLINICAL DATA:  Abdominal pain, nausea, vomiting.  EXAM: NUCLEAR MEDICINE HEPATOBILIARY IMAGING  TECHNIQUE: Sequential images of the abdomen were obtained out to 60 minutes following intravenous administration of radiopharmaceutical.  RADIOPHARMACEUTICALS:  5.0 [place] [id number] Choletec  COMPARISON:  Ultrasound 11/15/2012  FINDINGS: There is prompt uptake and excretion of radiotracer by the liver. Slight filling of the gallbladder early with the late further filling of the gallbladder. No evidence of cystic duct or common bile duct obstruction.  IMPRESSION: No evidence of biliary obstruction. There is slight filling of the gallbladder early with delayed further filling. This could reflect mild chronic gallbladder disease.   Electronically Signed   By: Rolm Baptise M.D.   On: 11/17/2013 09:27   US Biopsy  11/17/2013   CLINICAL DATA:  33 year old male with acute renal failure, new diagnosis of HIV and progressive proteinuria. Random renal biopsy is warranted.  EXAM: ULTRASOUND BIOPSY CORE LIVER  Date: 11/17/2013  PROCEDURE: 1. Ultrasound-guided random biopsy of the lower pole cortex of the right kidney Interventional Radiologist:   Criselda Peaches, MD  ANESTHESIA/SEDATION: Moderate (conscious) sedation was used. Two mg Versed, 50 mcg Fentanyl were administered intravenously. The patient's vital signs were monitored continuously by radiology nursing throughout the procedure.  Sedation Time: 19 minutes  TECHNIQUE: Informed consent was obtained from the patient following explanation of the procedure, risks, benefits and alternatives. The patient understands, agrees and consents for the procedure. All questions were addressed. A time out was performed.  The bilateral flanks were interrogated with ultrasound. The right lower renal pole appears more accessible to percutaneous biopsy. An appropriate skin entry site was selected and marked. The region was sterilely prepped and draped in the standard fashion using Betadine skin prep.  Local anesthesia was attained by infiltration with 1% lidocaine. A small dermatotomy was made. Under real-time sonographic guidance, a 15 gauge trocar needle was advanced and positioned in the retroperitoneal fat just outside the renal parenchyma. Two 16 gauge core biopsies were then obtained, again under real-time sonographic guidance. Post biopsy imaging demonstrates no perinephric hematoma or evidence of active bleeding.  The biopsy specimens were placed in saline. The patient tolerated the procedure well.  COMPLICATIONS: None immediate  IMPRESSION: Technically successful ultrasound-guided random core biopsy of the lower pole cortex of the right kidney.  Signed,  Criselda Peaches, MD  [facility]  [facility]   Electronically Signed   By: Jacqulynn Cadet M.D.   On: 11/17/2013 15:35      . azithromycin  1,200 mg Oral Weekly  . clindamycin  300 mg Oral 4 times per day  . cyclobenzaprine  10 mg Oral Once  . hydrocortisone   Rectal QID  . hydrocortisone-pramoxine  1 applicator Rectal BID  . nystatin  5 mL Oral QID  . pantoprazole  40 mg Oral Q1200  .  primaquine  30 mg Oral Daily  . sodium bicarbonate  1,300 mg Oral BID     I  have reviewed scheduled and prn medications.  ASSESSMENT/RECOMMENDATIONS   BACKGROUND: Mr Yohey is 33 yo man with pmh of asthma and newly diagnosed HIV/AIDS (CD4 40, VL 67K on 11/10/13) admitted on 9/11 for HCAP who we are consulted for in regards to AKI with a creatinine of 0.86 that  increased to 2.6 in the setting of IV contrast, IV/po bactrim, vancomycin and pre-admission/admission NSAIDS. Renal function has continued to worsen despite elimination of offending agents, and pt noted to have nephrotic range proteinuria.    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 Pt also with hematuria during hospitalization. Continue to monitor renal function panel daily, daily weights, strict I/O's, and avoidance of nephrotoxins. Pt had IR guided diagnostic percutaneous renal biopsy on 9/21 with preliminary results possibly this afternoon. c-ANCA positive however most likely falsely positive in setting of HIV infection.  Newly Diagnosed HIV 1 Infection/AIDS - Most likely since at least 2012 (lympocytopenia on 07/30/10 CBC). ID following. Plan to start HAART once genotype known (possibly abacavir based regimen) and renal function improves. Pt on weekly 1200 mg PO azithromycin for MAC prophylaxis. Probable PCP Pneumonia - CXR on 9/18 unchanged. Blood cultures & AFB cultures pending. LDH elevated at 697, CD4 <200, and patchy inflitrates concerning for PCP PNA. Currently on clindamycin and primaquine. ID following. Pt with PaO2 of 56 on admission (9/11) with persistent hypoxia (SpO2 91%)  during hospitalization requiring oxygen supplementation. Yesterday with normal oxygen saturation with ambulation.   Acute CMV Infection - ID following. Pt to start valcyteon after PCR results. Metabolic Acidosis -  Resolved. No AG (11) with normal bicarbonate of 19. S/p IV bicarbonate administration on 9/19. Continue PO sodium bicarbonate 650 mg TID.  Hypotonic Euvolemic Hyponatremia - Na 129 today, 135 on admission (9/11). Chronic since >48 hr duration. No significant response with IV fluids. Urine sodium >20 and osmolarity >100 in setting on acute renal failure. Continue 1.5 L fluid restriction.  Acute Microcytic Anemia - Yesterday Hg stable at 10.5, however below baseline 14 (on admission 12.2). Hemolytic labs did not suggest hemolytic anemia and pt is G6PD negative. Obtain FOBT due to recent dark stools (pt reported to have external non-bleeding hemorrhoids). Pt with hematuria on recent UA on 11/16/13.  Acute Thrombocytopenia - Resolved. Platelet count 253K today. Most likely due to recent bactrim use in setting of chronic liver disease. TTP unlikely given no schistocytes on blood smear  Chronically elevated AST/ALT -  Elevated since July 2015. Hepatitis panel negative. No reported alcohol use. Normal appearing liver on CT abdomen. Etiology most likely due to chronic HIV infection. US abdomen on 9/19 with small amount of sludge within gallbladder.   Juluis Mire, MD PGY-II [facility] Pager [phone] 11/19/2013 7:07 AM  Attending note to follow

## 2013-11-19 NOTE — Progress Notes (Signed)
I saw the patient and agree with the above assessment and plan.    Patient is doing well today and has been stable. No new issues. Potentially there will be results of the renal biopsy the preliminary form this afternoon. There is a question as to whether or not he has a current CMV infection and ID is evaluating.  His serum creatinine has improved over the past 24 hours. He made 1.8 L of urine. I believe he is having some recovery from the acute insult he sustained which I believe is largely driven by contrast exposure.  I await the results of the biopsy.

## 2013-11-20 DIAGNOSIS — B259 Cytomegaloviral disease, unspecified: Secondary | ICD-10-CM

## 2013-11-20 LAB — RENAL FUNCTION PANEL
Albumin: 1.5 g/dL — ABNORMAL LOW (ref 3.5–5.2)
Anion gap: 11 (ref 5–15)
BUN: 31 mg/dL — ABNORMAL HIGH (ref 6–23)
CO2: 21 mEq/L (ref 19–32)
Calcium: 8 mg/dL — ABNORMAL LOW (ref 8.4–10.5)
Chloride: 98 mEq/L (ref 96–112)
Creatinine, Ser: 4.94 mg/dL — ABNORMAL HIGH (ref 0.50–1.35)
GFR calc Af Amer: 17 mL/min — ABNORMAL LOW (ref >90)
GFR calc non Af Amer: 14 mL/min — ABNORMAL LOW (ref >90)
Glucose, Bld: 82 mg/dL (ref 70–99)
Phosphorus: 4.7 mg/dL — ABNORMAL HIGH (ref 2.3–4.6)
Potassium: 4.4 mEq/L (ref 3.7–5.3)
Sodium: 130 mEq/L — ABNORMAL LOW (ref 137–147)

## 2013-11-20 LAB — CULTURE, BLOOD (ROUTINE X 2): Culture: NO GROWTH

## 2013-11-20 LAB — CBC
HCT: 31.4 % — ABNORMAL LOW (ref 39.0–52.0)
Hemoglobin: 10.7 g/dL — ABNORMAL LOW (ref 13.0–17.0)
MCH: 26.5 pg (ref 26.0–34.0)
MCHC: 34.1 g/dL (ref 30.0–36.0)
MCV: 77.7 fL — ABNORMAL LOW (ref 78.0–100.0)
Platelets: 312 10*3/uL (ref 150–400)
RBC: 4.04 MIL/uL — ABNORMAL LOW (ref 4.22–5.81)
RDW: 15.1 % (ref 11.5–15.5)
WBC: 7.2 10*3/uL (ref 4.0–10.5)

## 2013-11-20 MED ORDER — VALGANCICLOVIR HCL 450 MG PO TABS
450.0000 mg | ORAL_TABLET | ORAL | Status: DC
  Administered 2013-11-20 – 2013-11-24 (×3): 450 mg via ORAL
  Filled 2013-11-20 (×4): qty 1

## 2013-11-20 MED ORDER — DOCUSATE SODIUM 100 MG PO CAPS
100.0000 mg | ORAL_CAPSULE | Freq: Two times a day (BID) | ORAL | Status: DC
  Administered 2013-11-20 – 2013-11-25 (×11): 100 mg via ORAL
  Filled 2013-11-20 (×12): qty 1

## 2013-11-20 NOTE — Progress Notes (Signed)
INFECTIOUS DISEASE PROGRESS NOTE  ID: Jim Wyatt is a 33 y.o. male with  Principal Problem:   Pneumonia Active Problems:   Asthma   Elevated liver enzymes   History of shingles   Obesity   AKI (acute kidney injury)   Human immunodeficiency virus (HIV) disease  Subjective: No complaints.    Abtx:  Anti-infectives   Start     Dose/Rate Route Frequency Ordered Stop   11/13/13 1100  azithromycin (ZITHROMAX) tablet 1,200 mg     1,200 mg Oral Weekly 11/13/13 0950     11/13/13 0600  levofloxacin (LEVAQUIN) tablet 750 mg  Status:  Discontinued     750 mg Oral Every 48 hours 11/11/13 1306 11/14/13 1050   11/12/13 1800  clindamycin (CLEOCIN) capsule 300 mg     300 mg Oral 4 times per day 11/12/13 1458 11/29/13 1759   11/12/13 1600  primaquine tablet 30 mg     30 mg Oral Daily 11/12/13 1458 11/29/13 0959   11/11/13 1600  sulfamethoxazole-trimethoprim (BACTRIM DS) 800-160 MG per tablet 2 tablet  Status:  Discontinued     2 tablet Oral 3 times daily 11/11/13 1011 11/12/13 1458   11/11/13 0500  levofloxacin (LEVAQUIN) tablet 750 mg  Status:  Discontinued     750 mg Oral Every 24 hours 11/10/13 1042 11/11/13 1306   11/10/13 1200  sulfamethoxazole-trimethoprim (BACTRIM DS) 800-160 MG per tablet 2 tablet  Status:  Discontinued     2 tablet Oral 4 times daily 11/10/13 1100 11/11/13 1011   11/10/13 0200  sulfamethoxazole-trimethoprim (BACTRIM) 340 mg of trimethoprim in dextrose 5 % 500 mL IVPB  Status:  Discontinued     340 mg of trimethoprim 347.5 mL/hr over 90 Minutes Intravenous Every 6 hours 11/09/13 2056 11/10/13 1100   11/09/13 1800  sulfamethoxazole-trimethoprim (BACTRIM) 340 mg of trimethoprim in dextrose 5 % 500 mL IVPB  Status:  Discontinued     340 mg of trimethoprim 347.5 mL/hr over 90 Minutes Intravenous 4 times per day 11/09/13 1637 11/09/13 2056   11/07/13 0800  vancomycin (VANCOCIN) IVPB 1000 mg/200 mL premix  Status:  Discontinued     1,000 mg 200 mL/hr over 60  Minutes Intravenous Every 8 hours 11/07/13 0542 11/09/13 1603   11/07/13 0600  levofloxacin (LEVAQUIN) IVPB 750 mg  Status:  Discontinued     750 mg 100 mL/hr over 90 Minutes Intravenous Every 24 hours 11/07/13 0539 11/10/13 1042   11/07/13 0545  vancomycin (VANCOCIN) IVPB 1000 mg/200 mL premix  Status:  Discontinued     1,000 mg 200 mL/hr over 60 Minutes Intravenous  Once 11/07/13 0539 11/07/13 0557   11/07/13 0030  levofloxacin (LEVAQUIN) IVPB 750 mg     750 mg 100 mL/hr over 90 Minutes Intravenous  Once 11/07/13 0029 11/07/13 0235   11/07/13 0030  vancomycin (VANCOCIN) IVPB 1000 mg/200 mL premix     1,000 mg 200 mL/hr over 60 Minutes Intravenous  Once 11/07/13 0029 11/07/13 0405      Medications:  Scheduled: . azithromycin  1,200 mg Oral Weekly  . clindamycin  300 mg Oral 4 times per day  . cyclobenzaprine  10 mg Oral Once  . hydrocortisone   Rectal QID  . hydrocortisone-pramoxine  1 applicator Rectal BID  . Influenza vac split quadrivalent PF  0.5 mL Intramuscular Tomorrow-1000  . nystatin  5 mL Oral QID  . pantoprazole  40 mg Oral Q1200  . pneumococcal 23 valent vaccine  0.5 mL Intramuscular Tomorrow-1000  .  primaquine  30 mg Oral Daily  . sodium bicarbonate  1,300 mg Oral BID  . valGANciclovir  450 mg Oral QODAY    Objective: Vital signs in last 24 hours: Temp:  [97.9 F (36.6 C)-100.1 F (37.8 C)] 97.9 F (36.6 C) (09/24 0558) Pulse Rate:  [90-102] 90 (09/24 0558) Resp:  [18] 18 (09/24 0558) BP: (120-130)/(62-82) 130/82 mmHg (09/24 0558) SpO2:  [92 %-94 %] 94 % (09/24 0558) Weight:  [95.4 kg (210 lb 5.1 oz)] 95.4 kg (210 lb 5.1 oz) (09/24 0558)   General appearance: alert, cooperative and no distress  Lab Results  Recent Labs  11/18/13 0550 11/19/13 0557 11/20/13 0453  WBC 6.3  --  7.2  HGB 10.5*  --  10.7*  HCT 30.9*  --  31.4*  NA 128* 129* 130*  K 4.9 4.4 4.4  CL 98 99 98  CO2 19 21 21   BUN 35* 34* 31*  CREATININE 5.33* 5.11* 4.94*   Liver  Panel  Recent Labs  11/17/13 1400  11/19/13 0557 11/20/13 0453  PROT 5.5*  --   --   --   ALBUMIN 1.4*  < > 1.3* 1.5*  AST 70*  --   --   --   ALT 50  --   --   --   ALKPHOS 64  --   --   --   BILITOT 0.4  --   --   --   BILIDIR <0.2  --   --   --   IBILI NOT CALCULATED  --   --   --   < > = values in this interval not displayed. Sedimentation Rate No results found for this basename: ESRSEDRATE,  in the last 72 hours C-Reactive Protein No results found for this basename: CRP,  in the last 72 hours  Microbiology: Recent Results (from the past 240 hour(s))  CULTURE, BLOOD (ROUTINE X 2)     Status: None   Collection Time    11/14/13 12:30 PM      Result Value Ref Range Status   Specimen Description BLOOD RIGHT ARM   Final   Special Requests BOTTLES DRAWN AEROBIC ONLY 10CC   Final   Culture  Setup Time     Final   Value: 11/14/2013 21:41     Performed at [facility]   Culture     Final   Value: NO GROWTH 5 DAYS     Performed at [facility]   Report Status 11/20/2013 FINAL   Final  URINE CULTURE     Status: None   Collection Time    11/14/13  5:44 PM      Result Value Ref Range Status   Specimen Description URINE, RANDOM   Final   Special Requests Normal   Final   Culture  Setup Time     Final   Value: 11/14/2013 18:24     Performed at [facility]     Final   Value: NO GROWTH     Performed at [facility]   Culture     Final   Value: NO GROWTH     Performed at [facility]   Report Status 11/15/2013 FINAL   Final  AFB CULTURE, BLOOD     Status: None   Collection Time    11/15/13  8:40 PM      Result Value Ref Range Status   Specimen Description BLOOD RIGHT ARM  Final   Special Requests 5CC AFB   Final   Culture     Final   Value: CULTURE WILL BE EXAMINED FOR 6 WEEKS BEFORE ISSUING A FINAL REPORT     Performed at [facility]   Report Status PENDING   Incomplete    Studies/Results: Dg Chest 2  View  11/19/2013   CLINICAL DATA:  Followup hypoxia.  EXAM: CHEST  2 VIEW  COMPARISON:  11/14/2013.  FINDINGS: Improved perihilar opacities, not yet quite clear consistent with resolving pneumonia. No focal consolidation. Small RIGHT pleural effusion persists. Unchanged cardiomediastinal silhouette.  IMPRESSION: Improved aeration.   Electronically Signed   By: Rolla Flatten M.D.   On: 11/19/2013 12:43     Assessment/Plan: AIDS  Pneumonia  ARF  CMV PCR 89,011 Fever w/u negative (BCx, UCx, doppler)   His CMV PCR+, will start valcyte 450mg  qod. Query if this is cause of his ARF? Await renal Bx result- out today Await his HIV genotype-  Cr improving.   Total days of antibiotics:  9/24 Valcyte 9/16 clinda/primaquine  9/11 levaquin 9/18  9/13 bactrim 9/16          Bobby Rumpf Infectious Diseases (pager) [phone] [email or web address] 11/20/2013, 10:00 AM  LOS: 13 days

## 2013-11-20 NOTE — Progress Notes (Signed)
PROGRESS NOTE  Jim Wyatt Xxxscotton [name] DOB: 12/27/1980 DOA: 11/07/2013 PCP: No PCP Per Patient  Jim Wyatt is a 33 y.o. male with a past medical history of asthma, and a recently treated community-acquired pneumonia, who presents with a chest pain, shortness of breath and abdominal pain.  The patient was seen on 9/5 and diagnosed with community-acquired pneumonia. He was discharged home with doxycycline.he comes back with persistent recurrent symptoms. 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  ID consulted for his new HIV status. On admission his creatinine was less than 1.   He developed acute renal failure and his creatinine was 2.55 on 9/15. His US renal is negative for hydronephrosis. His creatinine went up to 5.33 over the last one week despite hydration. He continues to have good urine output. Renal consulted and he underwent renal biopsy on 9/21 with results pending. .   Assessment/Plan:  Healthcare associated pneumonia:  -CT chest and abdomen reveals left pulmonary infiltrates.  -blood culture x 2 NGTD -urine legionella negative.  -S. pneumococcal antigen negative  -HIV positive -Continue clindamycin and primaquine -repeat CXR, wean O2.   AKI -due to combination of bactrim, vancomycin and contrast and nsaids int he last few days.  -Bactrim and vancomycin stopped  -Renal following, urine output good, -S/p renal biopsy on 9/21, results pending   HIV/AIDS  -Appreciate IDinput -await HIV genotype  abdominal pain: thought to be  due to ibuprofen-induced gastritis. ? Mild pancreatitis, consistent with elevation of lipase.  CT abdomen and pelvis on admission did not reveal any acute pathology.  -improving -Diarrhea resolved.C diff PCR neg -Protonix 40 mg daily  -US abdomen : some Gb sludge  Asthma: stable. -Albuterol inhaler when necessary   Fever: -improving -on Abx for pneumonia -CMV titres positive, started Valcyte  Hemorrhoids -continue anusol,  hydrocortisone cream -add sitz bath   Code Status: full Family Communication: discussed with the mother   Disposition Plan: pending further investigation.   Consultants: Renal ID IR  HPI/Subjective: hemorrhoids painful since yesterday  Objective: Filed Vitals:   11/20/13 0558  BP: 130/82  Pulse: 90  Temp: 97.9 F (36.6 C)  Resp: 18    Intake/Output Summary (Last 24 hours) at 11/20/13 1356 Last data filed at 11/20/13 1019  Gross per 24 hour  Intake    740 ml  Output   1850 ml  Net  -1110 ml   Filed Weights   11/17/13 0543 11/18/13 0540 11/20/13 0558  Weight: 95.89 kg (211 lb 6.4 oz) 95.709 kg (211 lb) 95.4 kg (210 lb 5.1 oz)    Exam:   General:  A+Ox3, NAD  Cardiovascular: rrr  Respiratory: clear, no wheezing  Abdomen: +BS, soft  Musculoskeletal: no edema   Data Reviewed: Basic Metabolic Panel:  Recent Labs Lab 11/16/13 0415 11/17/13 0540 11/18/13 0550 11/19/13 0557 11/20/13 0453  NA 129* 131* 128* 129* 130*  K 4.3 4.9 4.9 4.4 4.4  CL 100 102 98 99 98  CO2 19 20 19 21 21   GLUCOSE 98 87 87 86 82  BUN 26* 29* 35* 34* 31*  CREATININE 4.53* 5.20* 5.33* 5.11* 4.94*  CALCIUM 7.3* 7.4* 7.8* 7.6* 8.0*  PHOS 4.0 3.9 3.9 4.4 4.7*   Liver Function Tests:  Recent Labs Lab 11/14/13 0323 11/15/13 0820  11/17/13 0540 11/17/13 1400 11/18/13 0550 11/19/13 0557 11/20/13 0453  AST  --  99*  --   --  70*  --   --   --   ALT  --  73*  --   --  50  --   --   --   ALKPHOS  --  75  --   --  64  --   --   --   BILITOT 0.4 0.4  --   --  0.4  --   --   --   PROT  --  5.9*  --   --  5.5*  --   --   --   ALBUMIN 1.6* 1.5*  1.5*  < > 1.3* 1.4* 1.5* 1.3* 1.5*  < > = values in this interval not displayed.  Recent Labs Lab 11/15/13 0820 11/16/13 1157 11/17/13 0540  LIPASE 81* 73* 51   No results found for this basename: AMMONIA,  in the last 168 hours CBC:  Recent Labs Lab 11/14/13 0323 11/15/13 0820 11/17/13 0540 11/18/13 0550 11/20/13 0453    WBC 5.9 6.6 5.6 6.3 7.2  NEUTROABS 3.2 4.3  --   --   --   HGB 11.1* 11.6* 10.2* 10.5* 10.7*  HCT 32.9* 33.4* 29.1* 30.9* 31.4*  MCV 76.5* 78.8 76.4* 77.1* 77.7*  PLT 136* 166 215 253 312   Cardiac Enzymes:  Recent Labs Lab 11/14/13 0323 11/16/13 1157  CKTOTAL 328*  --   TROPONINI  --  <0.30   BNP (last 3 results)  Recent Labs  11/07/13 0855  PROBNP 33.5   CBG: No results found for this basename: GLUCAP,  in the last 168 hours  Recent Results (from the past 240 hour(s))  CULTURE, BLOOD (ROUTINE X 2)     Status: None   Collection Time    11/14/13 12:30 PM      Result Value Ref Range Status   Specimen Description BLOOD RIGHT ARM   Final   Special Requests BOTTLES DRAWN AEROBIC ONLY 10CC   Final   Culture  Setup Time     Final   Value: 11/14/2013 21:41     Performed at [facility]   Culture     Final   Value: NO GROWTH 5 DAYS     Performed at [facility]   Report Status 11/20/2013 FINAL   Final  URINE CULTURE     Status: None   Collection Time    11/14/13  5:44 PM      Result Value Ref Range Status   Specimen Description URINE, RANDOM   Final   Special Requests Normal   Final   Culture  Setup Time     Final   Value: 11/14/2013 18:24     Performed at [facility]     Final   Value: NO GROWTH     Performed at [facility]   Culture     Final   Value: NO GROWTH     Performed at [facility]   Report Status 11/15/2013 FINAL   Final  AFB CULTURE, BLOOD     Status: None   Collection Time    11/15/13  8:40 PM      Result Value Ref Range Status   Specimen Description BLOOD RIGHT ARM   Final   Special Requests 5CC AFB   Final   Culture     Final   Value: CULTURE WILL BE EXAMINED FOR 6 WEEKS BEFORE ISSUING A FINAL REPORT     Performed at [facility]   Report Status PENDING   Incomplete     Studies: Dg Chest 2 View  11/19/2013   CLINICAL DATA:  Followup hypoxia.  EXAM: CHEST  2 VIEW   COMPARISON:  11/14/2013.  FINDINGS: Improved perihilar opacities, not yet quite clear consistent with resolving pneumonia. No focal consolidation. Small RIGHT pleural effusion persists. Unchanged cardiomediastinal silhouette.  IMPRESSION: Improved aeration.   Electronically Signed   By: Rolla Flatten M.D.   On: 11/19/2013 12:43    Scheduled Meds: . azithromycin  1,200 mg Oral Weekly  . clindamycin  300 mg Oral 4 times per day  . cyclobenzaprine  10 mg Oral Once  . docusate sodium  100 mg Oral BID  . hydrocortisone   Rectal QID  . hydrocortisone-pramoxine  1 applicator Rectal BID  . Influenza vac split quadrivalent PF  0.5 mL Intramuscular Tomorrow-1000  . nystatin  5 mL Oral QID  . pantoprazole  40 mg Oral Q1200  . pneumococcal 23 valent vaccine  0.5 mL Intramuscular Tomorrow-1000  . primaquine  30 mg Oral Daily  . sodium bicarbonate  1,300 mg Oral BID  . valGANciclovir  450 mg Oral QODAY   Continuous Infusions:   Antibiotics Given (last 72 hours)   Date/Time Action Medication Dose   11/17/13 1812 Given   clindamycin (CLEOCIN) capsule 300 mg 300 mg   11/18/13 0120 Given   clindamycin (CLEOCIN) capsule 300 mg 300 mg   11/18/13 0521 Given   clindamycin (CLEOCIN) capsule 300 mg 300 mg   11/18/13 1045 Given   clindamycin (CLEOCIN) capsule 300 mg 300 mg   11/18/13 1045 Given   primaquine tablet 30 mg 30 mg   11/18/13 1822 Given   clindamycin (CLEOCIN) capsule 300 mg 300 mg   11/18/13 2207 Given   clindamycin (CLEOCIN) capsule 300 mg 300 mg   11/19/13 0647 Given   clindamycin (CLEOCIN) capsule 300 mg 300 mg   11/19/13 1106 Given   primaquine tablet 30 mg 30 mg   11/19/13 1107 Given   clindamycin (CLEOCIN) capsule 300 mg 300 mg   11/19/13 1835 Given   clindamycin (CLEOCIN) capsule 300 mg 300 mg   11/19/13 2308 Given   clindamycin (CLEOCIN) capsule 300 mg 300 mg   11/20/13 0547 Given   clindamycin (CLEOCIN) capsule 300 mg 300 mg   11/20/13 1159 Given   primaquine tablet 30 mg  30 mg   11/20/13 1203 Given   clindamycin (CLEOCIN) capsule 300 mg 300 mg      Principal Problem:   Pneumonia Active Problems:   Asthma   Elevated liver enzymes   History of shingles   Obesity   AKI (acute kidney injury)   Human immunodeficiency virus (HIV) disease    Time spent: 48 min    [facility] Pager [phone]. If 7PM-7AM, please contact night-coverage at [email or web address], password [facility] 11/20/2013, 1:56 PM  LOS: 13 days

## 2013-11-20 NOTE — Progress Notes (Signed)
Spoke with Dr. Broadus John over the phone, regarding sitz bath. Explained to pt the benefits of doing the sitz baths. Sitz baths are still in the room, pt tolerated the first one well but has refused all other sitz baths. Etta Quill, RN

## 2013-11-20 NOTE — Progress Notes (Addendum)
[facility] Rounding Note   Subjective:  Pt seen and examined in AM.  Pt with low-grade fever of 100.1 F last night with night sweats.  He continues to make good urine output (1.85L yesterday) and denies hematuria or other urinary symptoms. He still has dyspnea worse at night and continues to require oxygen. His mother reports his nail beds become blue mostly at night. He continues to have painful nonbleeding external hemorrhoids. No nausea, vomiting, or abdominal pain. Per his mother both of his legs are swollen.   Objective:   Vital signs in last 24 hours: Filed Vitals:   11/18/13 1957 11/19/13 0455 11/19/13 2126 11/20/13 0558  BP: 138/84 130/77 120/62 130/82  Pulse: 92 82 102 90  Temp: 99 F (37.2 C) 97.9 F (36.6 C) 100.1 F (37.8 C) 97.9 F (36.6 C)  TempSrc: Oral Oral Oral Oral  Resp: 18 18 18 18   Height:      Weight:    210 lb 5.1 oz (95.4 kg)  SpO2: 91% 95% 92% 94%   Weight change:   Intake/Output Summary (Last 24 hours) at 11/20/13 0709 Last data filed at 11/20/13 0553  Gross per 24 hour  Intake    500 ml  Output   1850 ml  Net  -1350 ml    Physical Exam:  Blood pressure 130/82, pulse 90, temperature 97.9 F (36.6 C), temperature source Oral, resp. rate 18, height 5\' 7"  (1.702 m), weight 210 lb 5.1 oz (95.4 kg), SpO2 94.00%.  General: No acute distress, breathing on oxygen via [place] HEENT: No oral thrush, cervical adenopathy  Cardiac: Normal rate and regular rhythm  Pulmonary: Crackles at  bases. No wheezing, ronchi, or rales GI: Ventral hernia (midline) with tenderness to palpation. Normal BS.  Extremities: Nonpitting b/l LE edema  Labs:   Recent Labs Lab 11/14/13 0323 11/15/13 0820 11/16/13 0415 11/17/13 0540 11/18/13 0550 11/19/13 0557 11/20/13 0453  NA 128* 127* 129* 131* 128* 129* 130*  K 5.2 5.0 4.3 4.9 4.9 4.4 4.4  CL 98 101 100 102 98 99 98  CO2 17* 14* 19 20 19 21 21   GLUCOSE 86 91 98 87 87 86 82  BUN 21 24* 26* 29* 35* 34*  31*  CREATININE 2.60* 3.62* 4.53* 5.20* 5.33* 5.11* 4.94*  CALCIUM 7.5* 7.5* 7.3* 7.4* 7.8* 7.6* 8.0*  PHOS 2.0* 3.4 4.0 3.9 3.9 4.4 4.7*     Recent Labs Lab 11/14/13 0323 11/15/13 0820  11/17/13 1400 11/18/13 0550 11/19/13 0557 11/20/13 0453  AST  --  99*  --  70*  --   --   --   ALT  --  73*  --  50  --   --   --   ALKPHOS  --  75  --  64  --   --   --   BILITOT 0.4 0.4  --  0.4  --   --   --   PROT  --  5.9*  --  5.5*  --   --   --   ALBUMIN 1.6* 1.5*  1.5*  < > 1.4* 1.5* 1.3* 1.5*  < > = values in this interval not displayed.  Recent Labs Lab 11/14/13 0323 11/15/13 0820 11/17/13 0540 11/18/13 0550 11/20/13 0453  WBC 5.9 6.6 5.6 6.3 7.2  NEUTROABS 3.2 4.3  --   --   --   HGB 11.1* 11.6* 10.2* 10.5* 10.7*  HCT 32.9* 33.4* 29.1* 30.9* 31.4*  MCV 76.5* 78.8 76.4*  77.1* 77.7*  PLT 136* 166 215 253 312     Recent Labs Lab 11/14/13 0323 11/16/13 1157  CKTOTAL 328*  --   TROPONINI  --  <0.30     Studies/Results: Dg Chest 2 View  11/19/2013   CLINICAL DATA:  Followup hypoxia.  EXAM: CHEST  2 VIEW  COMPARISON:  11/14/2013.  FINDINGS: Improved perihilar opacities, not yet quite clear consistent with resolving pneumonia. No focal consolidation. Small RIGHT pleural effusion persists. Unchanged cardiomediastinal silhouette.  IMPRESSION: Improved aeration.   Electronically Signed   By: Rolla Flatten M.D.   On: 11/19/2013 12:43      . azithromycin  1,200 mg Oral Weekly  . clindamycin  300 mg Oral 4 times per day  . cyclobenzaprine  10 mg Oral Once  . hydrocortisone   Rectal QID  . hydrocortisone-pramoxine  1 applicator Rectal BID  . Influenza vac split quadrivalent PF  0.5 mL Intramuscular Tomorrow-1000  . nystatin  5 mL Oral QID  . pantoprazole  40 mg Oral Q1200  . pneumococcal 23 valent vaccine  0.5 mL Intramuscular Tomorrow-1000  . primaquine  30 mg Oral Daily  . sodium bicarbonate  1,300 mg Oral BID     I  have reviewed scheduled and prn  medications.  ASSESSMENT/RECOMMENDATIONS   BACKGROUND: Mr Wiley is 33 yo man with pmh of asthma and newly diagnosed HIV/AIDS (CD4 40, VL 67K on 11/10/13) admitted on 9/11 for HCAP who we are consulted for in regards to AKI with a creatinine of 0.86 that  increased to 2.6 in the setting of IV contrast, IV/po bactrim, vancomycin and pre-admission/admission NSAIDS. Renal function has continued to worsen despite elimination of offending agents, and pt noted to have nephrotic range proteinuria.    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 Pt also with hematuria during hospitalization. Continue to monitor renal function panel daily, daily weights, strict I/O's, and avoidance of nephrotoxins. Pt had IR guided diagnostic percutaneous renal biopsy on 9/21 with preliminary results possibly this afternoon. c-ANCA positive however most likely falsely positive in setting of HIV infection. No indication for HD at this time. Pt will need outpatient nephrology follow-up.  Newly Diagnosed HIV 1 Infection/AIDS - Most likely since at least 2012 (lympocytopenia on 07/30/10 CBC). ID following. Plan to start HAART once genotype known (possibly abacavir based regimen) and renal function improves. Pt on weekly 1200 mg PO azithromycin for MAC prophylaxis. Probable PCP Pneumonia - CXR on 9/18 unchanged. Blood cultures & AFB cultures pending. LDH elevated at 697, CD4 <200, and patchy inflitrates concerning for PCP PNA. Currently on clindamycin and primaquine. ID following. Pt with PaO2 of 56 on admission (9/11) with persistent hypoxia (SpO2 91%) during  hospitalization requiring oxygen supplementation. Corticosteroid administration with CMV co-infection shown to have poorer prognosis.    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 ID following. Pt to start valcyteon.  Metabolic Acidosis -  Resolved. S/p IV bicarbonate administration on 9/19. Continue PO sodium bicarbonate 650 mg TID.  Hypotonic Euvolemic Hyponatremia - Na 130 today, 135 on admission (9/11). Chronic since >48 hr duration. No significant response with IV fluids. Urine sodium >20 and osmolarity >100 in setting on acute  renal failure. Continue 1.5 L fluid restriction.   Hyperphosphatemia - Phos 4.7 today in setting of acute renal failure. Continue to monitor. Acute Microcytic Anemia - Hg stable at 10.7, however below baseline 14 (on admission 12.2). Hemolytic labs did not suggest hemolytic anemia and pt is G6PD negative. Obtain FOBT due to recent dark stools (pt reported to have external non-bleeding hemorrhoids). Pt with hematuria on recent UA on 11/16/13.  Acute Thrombocytopenia - Resolved. Most likely due to recent bactrim use in setting of chronic liver disease. TTP unlikely given no schistocytes on blood smear  Chronically elevated AST/ALT -  Elevated since July 2015. Hepatitis panel negative. No reported alcohol use. Normal appearing liver on CT abdomen. 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 US abdomen on 9/19 with small amount of sludge within gallbladder.   Juluis Mire, MD PGY-II IMTS Pager [phone] 11/20/2013 7:09 AM  Attending note to follow

## 2013-11-20 NOTE — Progress Notes (Signed)
First Sitz bath complete. Pt tolerated well, states he feels like it made the hemmrohoids worst. Will notify dr. Etta Quill, RN

## 2013-11-20 NOTE — Progress Notes (Addendum)
I saw the patient and agree with the above assessment and plan.    She has been stable over the past 24 hours. Serum creatinine continues to improve and urine output is excellent. He is having pain with hemorrhoids. CMV PCR in the blood was positive. He is to start Valcyte renally dosed. He continues to have low-grade fevers, I suspect this is related to the CMV.  I spoke with [facility] nephropathology yesterday afternoon. They sample was only received yesterday. I expect a more formal preliminary result today.

## 2013-11-21 ENCOUNTER — Encounter (HOSPITAL_COMMUNITY): Payer: Self-pay | Admitting: General Surgery

## 2013-11-21 DIAGNOSIS — K649 Unspecified hemorrhoids: Secondary | ICD-10-CM

## 2013-11-21 LAB — RENAL FUNCTION PANEL
Albumin: 1.3 g/dL — ABNORMAL LOW (ref 3.5–5.2)
Anion gap: 9 (ref 5–15)
BUN: 29 mg/dL — ABNORMAL HIGH (ref 6–23)
CO2: 20 mEq/L (ref 19–32)
Calcium: 7.6 mg/dL — ABNORMAL LOW (ref 8.4–10.5)
Chloride: 101 mEq/L (ref 96–112)
Creatinine, Ser: 4.65 mg/dL — ABNORMAL HIGH (ref 0.50–1.35)
GFR calc Af Amer: 18 mL/min — ABNORMAL LOW (ref >90)
GFR calc non Af Amer: 16 mL/min — ABNORMAL LOW (ref >90)
Glucose, Bld: 86 mg/dL (ref 70–99)
Phosphorus: 4 mg/dL (ref 2.3–4.6)
Potassium: 4.5 mEq/L (ref 3.7–5.3)
Sodium: 130 mEq/L — ABNORMAL LOW (ref 137–147)

## 2013-11-21 LAB — CBC
HCT: 26.8 % — ABNORMAL LOW (ref 39.0–52.0)
Hemoglobin: 9.3 g/dL — ABNORMAL LOW (ref 13.0–17.0)
MCH: 26.8 pg (ref 26.0–34.0)
MCHC: 34.7 g/dL (ref 30.0–36.0)
MCV: 77.2 fL — ABNORMAL LOW (ref 78.0–100.0)
Platelets: 256 10*3/uL (ref 150–400)
RBC: 3.47 MIL/uL — ABNORMAL LOW (ref 4.22–5.81)
RDW: 14.9 % (ref 11.5–15.5)
WBC: 7.1 10*3/uL (ref 4.0–10.5)

## 2013-11-21 MED ORDER — FUROSEMIDE 40 MG PO TABS
40.0000 mg | ORAL_TABLET | Freq: Two times a day (BID) | ORAL | Status: DC
  Administered 2013-11-21 – 2013-11-25 (×10): 40 mg via ORAL
  Filled 2013-11-21 (×12): qty 1

## 2013-11-21 MED ORDER — LIDOCAINE HCL (PF) 2 % IJ SOLN
0.0000 mL | Freq: Once | INTRAMUSCULAR | Status: AC | PRN
  Filled 2013-11-21: qty 20

## 2013-11-21 NOTE — Progress Notes (Signed)
[facility] Rounding Note   Subjective:  Pt seen and examined in AM. No acute events overnight. He continues to make good urine output and denies hematuria or other urinary symptoms. He continues to have dyspnea worse at night and continues to require oxygen. He reports snoring, daytime sleepiness, and waking up from sleep short of breath. He continues to have painful nonbleeding external hemorrhoids that was not relieved with Sitz bath yesterday. No nausea, vomiting, or abdominal pain. Per his mother both of his legs are swollen. He was started on valcyte yesterday for CMV infection. He denies vision problems. Pt made aware of recent preliminary results indicating unfortunately collapsing form of FSGS.  Objective:   Vital signs in last 24 hours: Filed Vitals:   11/20/13 0558 11/20/13 1417 11/20/13 2138 11/21/13 0454  BP: 130/82 136/85 130/73 139/67  Pulse: 90 99 99 100  Temp: 97.9 F (36.6 C) 98.5 F (36.9 C) 99.3 F (37.4 C) 99.1 F (37.3 C)  TempSrc: Oral Oral Oral Oral  Resp: 18 17 18 18   Height:      Weight: 210 lb 5.1 oz (95.4 kg)   209 lb 14.1 oz (95.2 kg)  SpO2: 94% 92% 99% 92%   Weight change: -7.1 oz (-0.2 kg)  Intake/Output Summary (Last 24 hours) at 11/21/13 0721 Last data filed at 11/20/13 1810  Gross per 24 hour  Intake    360 ml  Output    900 ml  Net   -540 ml    Physical Exam:  Blood pressure 139/67, pulse 100, temperature 99.1 F (37.3 C), temperature source Oral, resp. rate 18, height 5\' 7"  (1.702 m), weight 209 lb 14.1 oz (95.2 kg), SpO2 92.00%.  General: No acute distress, breathing on oxygen via [place] HEENT: No oral thrush, cervical adenopathy  Cardiac: Normal rate and regular rhythm  Pulmonary: Crackles at left bases. No wheezing, ronchi, or rales GI: Ventral hernia (midline) with tenderness to palpation. Normal BS.  Extremities: Nonpitting b/l LE edema  Labs:   Recent Labs Lab 11/15/13 0820 11/16/13 0415 11/17/13 0540  11/18/13 0550 11/19/13 0557 11/20/13 0453 11/21/13 0602  NA 127* 129* 131* 128* 129* 130* 130*  K 5.0 4.3 4.9 4.9 4.4 4.4 4.5  CL 101 100 102 98 99 98 101  CO2 14* 19 20 19 21 21 20   GLUCOSE 91 98 87 87 86 82 86  BUN 24* 26* 29* 35* 34* 31* 29*  CREATININE 3.62* 4.53* 5.20* 5.33* 5.11* 4.94* 4.65*  CALCIUM 7.5* 7.3* 7.4* 7.8* 7.6* 8.0* 7.6*  PHOS 3.4 4.0 3.9 3.9 4.4 4.7* 4.0     Recent Labs Lab 11/15/13 0820  11/17/13 1400  11/19/13 0557 11/20/13 0453 11/21/13 0602  AST 99*  --  70*  --   --   --   --   ALT 73*  --  50  --   --   --   --   ALKPHOS 75  --  64  --   --   --   --   BILITOT 0.4  --  0.4  --   --   --   --   PROT 5.9*  --  5.5*  --   --   --   --   ALBUMIN 1.5*  1.5*  < > 1.4*  < > 1.3* 1.5* 1.3*  < > = values in this interval not displayed.  Recent Labs Lab 11/15/13 0820 11/17/13 0540 11/18/13 0550 11/20/13 0453 11/21/13 0602  WBC  6.6 5.6 6.3 7.2 7.1  NEUTROABS 4.3  --   --   --   --   HGB 11.6* 10.2* 10.5* 10.7* 9.3*  HCT 33.4* 29.1* 30.9* 31.4* 26.8*  MCV 78.8 76.4* 77.1* 77.7* 77.2*  PLT 166 215 253 312 256     Recent Labs Lab 11/16/13 1157  TROPONINI <0.30     Studies/Results: Dg Chest 2 View  11/19/2013   CLINICAL DATA:  Followup hypoxia.  EXAM: CHEST  2 VIEW  COMPARISON:  11/14/2013.  FINDINGS: Improved perihilar opacities, not yet quite clear consistent with resolving pneumonia. No focal consolidation. Small RIGHT pleural effusion persists. Unchanged cardiomediastinal silhouette.  IMPRESSION: Improved aeration.   Electronically Signed   By: Rolla Flatten M.D.   On: 11/19/2013 12:43      . azithromycin  1,200 mg Oral Weekly  . clindamycin  300 mg Oral 4 times per day  . cyclobenzaprine  10 mg Oral Once  . docusate sodium  100 mg Oral BID  . hydrocortisone   Rectal QID  . hydrocortisone-pramoxine  1 applicator Rectal BID  . nystatin  5 mL Oral QID  . pantoprazole  40 mg Oral Q1200  . primaquine  30 mg Oral Daily  . sodium  bicarbonate  1,300 mg Oral BID  . valGANciclovir  450 mg Oral QODAY     I  have reviewed scheduled and prn medications.  ASSESSMENT/RECOMMENDATIONS   BACKGROUND: Mr Borrego is 33 yo man with pmh of asthma and newly diagnosed HIV/AIDS (CD4 40, VL 67K on 11/10/13) admitted on 9/11 for HCAP who we are consulted for in regards to AKI with a creatinine of 0.86 that  increased to 2.6 in the setting of IV contrast, IV/po bactrim, vancomycin and pre-admission/admission NSAIDS. Renal function has continued to worsen despite elimination of offending agents, and pt noted to have nephrotic range proteinuria.    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 Etiology most likely ATN from exogenous toxin exposure (contast, bactrim, vanc, levaquin & NSAID use) in setting of collapsing form of FSGS. Pt had IR guided diagnostic percutaneous renal biopsy on 9/21 with preliminary results indicating unfortunately collapsing form of FSGS. Continue to monitor renal function panel daily, daily weights, strict I/O's, and avoidance of nephrotoxins. No indication for HD at this time. Will start PO lasix 40 mg BID. Will consult RD for education on low sodium diet. Pt to have outpatient nephrology follow-up with Dr. [name].   Newly Diagnosed HIV 1 Infection/AIDS - Most likely since at least 2012 (lympocytopenia on 07/30/10 CBC). ID following. Plan to start HAART once genotype known (possibly abacavir based regimen) and renal function improves. Pt on weekly 1200 mg PO azithromycin for MAC prophylaxis. Probable PCP Pneumonia - CXR on 9/23 improved. Blood cultures negative. AFB cultures final report pending. LDH elevated at 697, CD4 <200, and patchy inflitrates concerning for PCP PNA. Currently on clindamycin and primaquine. ID following. Pt with PaO2 of 56 on admission (9/11) with persistent hypoxia (SpO2 91%) during  hospitalization requiring oxygen supplementation. Corticosteroid administration with CMV co-infection shown to have poorer prognosis.    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 ID following. On valganciclovir 450 mg QOD (renally dosed). Pt needs outpatient vision examination.  Metabolic Acidosis -  Resolved. S/p IV bicarbonate administration on 9/19. Continue PO sodium bicarbonate 650 mg TID and on discharge.  Hypotonic Euvolemic Hyponatremia - Na  130 today, 135 on admission (9/11). Chronic since >48 hr duration. No significant response with IV fluids. Urine sodium >20 and osmolarity >100 in setting on acute renal failure. Continue 1.5 L fluid restriction.   Hyperphosphatemia - Resolved. Phos 4 today in setting of acute renal failure. Continue to monitor. Acute Microcytic Anemia - Hg stable at 9.3 today, however below baseline 14 (on admission 12.2). Hemolytic labs did not suggest hemolytic anemia and pt is G6PD negative. Obtain FOBT due to recent dark stools (pt reported to have external non-bleeding hemorrhoids). Pt with hematuria on recent UA on 11/16/13.  Acute Thrombocytopenia - Resolved. Most likely due to recent bactrim use in setting of chronic liver disease. TTP unlikely given no schistocytes on blood smear  Chronically elevated AST/ALT -  Elevated since July 2015. Hepatitis panel negative. No reported alcohol use. Normal appearing liver on CT abdomen. 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 US abdomen on 9/19 with small amount of sludge within gallbladder.  Probable OSA - Pt needs outpatient sleep study.   Juluis Mire, MD PGY-II IMTS Pager [phone] 11/21/2013 7:21 AM  Attending note to follow

## 2013-11-21 NOTE — Consult Note (Signed)
Jim Wyatt. Georgette Dover, MD, [facility] Surgery  General/ Trauma Surgery  11/21/2013 3:53 PM

## 2013-11-21 NOTE — Progress Notes (Signed)
PROGRESS NOTE  Jim Wyatt Xxxscotton [id number] DOB: 21-Dec-1980 DOA: 11/07/2013 PCP: No PCP Per Patient  Jim Wyatt is a 33 y.o. male with a past medical history of asthma, and a recently treated community-acquired pneumonia, who presents with a chest pain, shortness of breath and abdominal pain.  The patient was seen on 9/5 and diagnosed with community-acquired pneumonia. He was discharged home with doxycycline.he comes back with persistent recurrent symptoms. 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  ID consulted for his new HIV status. On admission his creatinine was less than 1.   He developed acute renal failure and his creatinine was 2.55 on 9/15. His US renal is negative for hydronephrosis. His creatinine went up to 5.33 over the last one week despite hydration. He continues to have good urine output. Renal consulted and he underwent renal biopsy on 9/21 with results pending. .   Assessment/Plan:  Community acquired pneumonia/probable PCP pneumonia -CT chest and abdomen reveals left pulmonary infiltrates.  -blood culture x 2 NGTD -urine legionella negative.  -S. pneumococcal antigen negative  -Continue clindamycin and primaquine per ID, ? duration -repeat CXR-improved, wean off O2.   AKI -due to combination of bactrim, vancomycin and contrast and nsaids int he last few days.  -Bactrim and vancomycin stopped  -Renal following, urine output good, -S/p renal biopsy on 9/21, results pending  -creatinine improving  HIV/AIDS  -newly diagnosed -Appreciate IDinput -await HIV genotype, to start HAART when creatinine improves  Hemorrhoids/ extremely tender -likely thrombosed -not improving with anusol, hydrocortisone cream, stool softeners and sitz bath -will ask [facility] for input  Asthma: stable. -Albuterol inhaler PRN  Fever: -improving -on Abx for pneumonia -CMV titres positive, started Valcyte 9/24  Hemorrhoids -continue anusol, hydrocortisone cream -add sitz bath    Code Status: full Family Communication: discussed with the mother   Disposition Plan: pending further investigation.   Consultants: Renal ID IR  HPI/Subjective: hemorrhoids very painful for 2days Not improving despite steroids, sitz bath etc  Objective: Filed Vitals:   11/21/13 0454  BP: 139/67  Pulse: 100  Temp: 99.1 F (37.3 C)  Resp: 18    Intake/Output Summary (Last 24 hours) at 11/21/13 0912 Last data filed at 11/20/13 1810  Gross per 24 hour  Intake    360 ml  Output    900 ml  Net   -540 ml   Filed Weights   11/18/13 0540 11/20/13 0558 11/21/13 0454  Weight: 95.709 kg (211 lb) 95.4 kg (210 lb 5.1 oz) 95.2 kg (209 lb 14.1 oz)    Exam:   General:  A+Ox3, NAD  Cardiovascular: rrr  Respiratory: clear, no wheezing  Abdomen: +BS, soft  Rectal: protuberant large tender hemorrhoids  Musculoskeletal: no edema   Data Reviewed: Basic Metabolic Panel:  Recent Labs Lab 11/17/13 0540 11/18/13 0550 11/19/13 0557 11/20/13 0453 11/21/13 0602  NA 131* 128* 129* 130* 130*  K 4.9 4.9 4.4 4.4 4.5  CL 102 98 99 98 101  CO2 20 19 21 21 20   GLUCOSE 87 87 86 82 86  BUN 29* 35* 34* 31* 29*  CREATININE 5.20* 5.33* 5.11* 4.94* 4.65*  CALCIUM 7.4* 7.8* 7.6* 8.0* 7.6*  PHOS 3.9 3.9 4.4 4.7* 4.0   Liver Function Tests:  Recent Labs Lab 11/15/13 0820  11/17/13 1400 11/18/13 0550 11/19/13 0557 11/20/13 0453 11/21/13 0602  AST 99*  --  70*  --   --   --   --   ALT 73*  --  50  --   --   --   --   ALKPHOS 75  --  64  --   --   --   --   BILITOT 0.4  --  0.4  --   --   --   --   PROT 5.9*  --  5.5*  --   --   --   --   ALBUMIN 1.5*  1.5*  < > 1.4* 1.5* 1.3* 1.5* 1.3*  < > = values in this interval not displayed.  Recent Labs Lab 11/15/13 0820 11/16/13 1157 11/17/13 0540  LIPASE 81* 73* 51   No results found for this basename: AMMONIA,  in the last 168 hours CBC:  Recent Labs Lab 11/15/13 0820 11/17/13 0540 11/18/13 0550 11/20/13 0453  11/21/13 0602  WBC 6.6 5.6 6.3 7.2 7.1  NEUTROABS 4.3  --   --   --   --   HGB 11.6* 10.2* 10.5* 10.7* 9.3*  HCT 33.4* 29.1* 30.9* 31.4* 26.8*  MCV 78.8 76.4* 77.1* 77.7* 77.2*  PLT 166 215 253 312 256   Cardiac Enzymes:  Recent Labs Lab 11/16/13 1157  TROPONINI <0.30   BNP (last 3 results)  Recent Labs  11/07/13 0855  PROBNP 33.5   CBG: No results found for this basename: GLUCAP,  in the last 168 hours  Recent Results (from the past 240 hour(s))  CULTURE, BLOOD (ROUTINE X 2)     Status: None   Collection Time    11/14/13 12:30 PM      Result Value Ref Range Status   Specimen Description BLOOD RIGHT ARM   Final   Special Requests BOTTLES DRAWN AEROBIC ONLY 10CC   Final   Culture  Setup Time     Final   Value: 11/14/2013 21:41     Performed at [facility]   Culture     Final   Value: NO GROWTH 5 DAYS     Performed at [facility]   Report Status 11/20/2013 FINAL   Final  URINE CULTURE     Status: None   Collection Time    11/14/13  5:44 PM      Result Value Ref Range Status   Specimen Description URINE, RANDOM   Final   Special Requests Normal   Final   Culture  Setup Time     Final   Value: 11/14/2013 18:24     Performed at [facility] Count     Final   Value: NO GROWTH     Performed at [facility]   Culture     Final   Value: NO GROWTH     Performed at [facility]   Report Status 11/15/2013 FINAL   Final  AFB CULTURE, BLOOD     Status: None   Collection Time    11/15/13  8:40 PM      Result Value Ref Range Status   Specimen Description BLOOD RIGHT ARM   Final   Special Requests 5CC AFB   Final   Culture     Final   Value: CULTURE WILL BE EXAMINED FOR 6 WEEKS BEFORE ISSUING A FINAL REPORT     Performed at [facility]   Report Status PENDING   Incomplete     Studies: Dg Chest 2 View  11/19/2013   CLINICAL DATA:  Followup hypoxia.  EXAM: CHEST  2 VIEW  COMPARISON:  11/14/2013.  FINDINGS:  Improved perihilar opacities, not yet quite clear consistent with resolving pneumonia. No focal consolidation. Small RIGHT pleural effusion persists. Unchanged cardiomediastinal silhouette.  IMPRESSION: Improved aeration.   Electronically Signed   By: Rolla Flatten M.D.   On: 11/19/2013 12:43    Scheduled Meds: . azithromycin  1,200 mg Oral Weekly  . clindamycin  300 mg Oral 4 times per day  . cyclobenzaprine  10 mg Oral Once  . docusate sodium  100 mg Oral BID  . hydrocortisone   Rectal QID  . hydrocortisone-pramoxine  1 applicator Rectal BID  . nystatin  5 mL Oral QID  . pantoprazole  40 mg Oral Q1200  . primaquine  30 mg Oral Daily  . sodium bicarbonate  1,300 mg Oral BID  . valGANciclovir  450 mg Oral QODAY   Continuous Infusions:   Antibiotics Given (last 72 hours)   Date/Time Action Medication Dose   11/18/13 1045 Given   clindamycin (CLEOCIN) capsule 300 mg 300 mg   11/18/13 1045 Given   primaquine tablet 30 mg 30 mg   11/18/13 1822 Given   clindamycin (CLEOCIN) capsule 300 mg 300 mg   11/18/13 2207 Given   clindamycin (CLEOCIN) capsule 300 mg 300 mg   11/19/13 0647 Given   clindamycin (CLEOCIN) capsule 300 mg 300 mg   11/19/13 1106 Given   primaquine tablet 30 mg 30 mg   11/19/13 1107 Given   clindamycin (CLEOCIN) capsule 300 mg 300 mg   11/19/13 1835 Given   clindamycin (CLEOCIN) capsule 300 mg 300 mg   11/19/13 2308 Given   clindamycin (CLEOCIN) capsule 300 mg 300 mg   11/20/13 0547 Given   clindamycin (CLEOCIN) capsule 300 mg 300 mg   11/20/13 1159 Given   primaquine tablet 30 mg 30 mg   11/20/13 1203 Given   clindamycin (CLEOCIN) capsule 300 mg 300 mg   11/20/13 1513 Given   valGANciclovir (VALCYTE) 450 MG tablet TABS 450 mg 450 mg   11/20/13 2321 Given   clindamycin (CLEOCIN) capsule 300 mg 300 mg   11/21/13 0557 Given   clindamycin (CLEOCIN) capsule 300 mg 300 mg      Principal Problem:   Pneumonia Active Problems:   Asthma   Elevated liver  enzymes   History of shingles   Obesity   AKI (acute kidney injury)   Human immunodeficiency virus (HIV) disease    Time spent: 12 min    [facility] Pager [phone]. If 7PM-7AM, please contact night-coverage at [email or web address], password [facility] 11/21/2013, 9:12 AM  LOS: 14 days

## 2013-11-21 NOTE — Progress Notes (Signed)
I saw the patient and agree with the above assessment and plan.    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 He also has ATN. 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  Fortunately, he does have some improvement in renal function again today. He has excellent urine output and stable electrolytes. The mainstay of treatment of his renal disease with the treatment of the HIV. He is not a candidate for ACE inhibitor or ARB at the current time. I do not think we need to pursue vascular access immediately. He has a slightly more edema on exam today. We will start low-dose oral Lasix twice daily. I will arrange followup with me within the next 2 weeks with labs checked.

## 2013-11-21 NOTE — Procedures (Signed)
Incision and Drainage of Thrombosed Hemorrhoid Procedure Note  Pre-operative Diagnosis: thrombosed hemorrhoid  Post-operative Diagnosis: same  Indications: This is a 33 yo black male who is a newly diagnosed HIV patient who has been in the hospital for 2 weeks for CAP and ARF.  He has had worsening of his hemorrhoids over the last several days despite treatment.  He was noticed to have a thrombosed hemorrhoid and we were called to evaluate it for further treatment.  Anesthesia: 2% plain lidocaine  Procedure Details  The procedure, risks and complications have been discussed in detail (including, but not limited to infection, bleeding) with the patient, and the patient has given verbal consent to the procedure.  The skin was sterilely prepped and draped over the affected area in the usual fashion. After adequate local anesthesia, I&D with a #11 blade was performed on the left lateral hemorrhoid.  A fair amount of clot was evacuated and the cavity was emptied.  Minimal bleeding was encountered.  Pressure was held for hemostasis. The patient was observed until stable.  Findings: Thrombosed hemorrhoid  EBL: 10 cc's  Drains: none  Condition: Tolerated procedure well and Stable   Complications: none.  Delanee Xin E 3:20 PM 11/21/2013

## 2013-11-21 NOTE — Procedures (Signed)
Successful evacuation of thrombosed external hemorrhoid  Imogene Burn. Georgette Dover, MD, [facility] Surgery  General/ Trauma Surgery  11/21/2013 3:53 PM

## 2013-11-21 NOTE — Progress Notes (Signed)
INFECTIOUS DISEASE PROGRESS NOTE  ID: Jim Wyatt is a 33 y.o. male with  Principal Problem:   Pneumonia Active Problems:   Asthma   Elevated liver enzymes   History of shingles   Obesity   AKI (acute kidney injury)   Human immunodeficiency virus (HIV) disease  Subjective: C/o rectal pain  Abtx:  Anti-infectives   Start     Dose/Rate Route Frequency Ordered Stop   11/20/13 1100  valGANciclovir (VALCYTE) 450 MG tablet TABS 450 mg     450 mg Oral Every other day 11/20/13 1007     11/13/13 1100  azithromycin (ZITHROMAX) tablet 1,200 mg     1,200 mg Oral Weekly 11/13/13 0950     11/13/13 0600  levofloxacin (LEVAQUIN) tablet 750 mg  Status:  Discontinued     750 mg Oral Every 48 hours 11/11/13 1306 11/14/13 1050   11/12/13 1800  clindamycin (CLEOCIN) capsule 300 mg     300 mg Oral 4 times per day 11/12/13 1458 11/29/13 1759   11/12/13 1600  primaquine tablet 30 mg     30 mg Oral Daily 11/12/13 1458 11/29/13 0959   11/11/13 1600  sulfamethoxazole-trimethoprim (BACTRIM DS) 800-160 MG per tablet 2 tablet  Status:  Discontinued     2 tablet Oral 3 times daily 11/11/13 1011 11/12/13 1458   11/11/13 0500  levofloxacin (LEVAQUIN) tablet 750 mg  Status:  Discontinued     750 mg Oral Every 24 hours 11/10/13 1042 11/11/13 1306   11/10/13 1200  sulfamethoxazole-trimethoprim (BACTRIM DS) 800-160 MG per tablet 2 tablet  Status:  Discontinued     2 tablet Oral 4 times daily 11/10/13 1100 11/11/13 1011   11/10/13 0200  sulfamethoxazole-trimethoprim (BACTRIM) 340 mg of trimethoprim in dextrose 5 % 500 mL IVPB  Status:  Discontinued     340 mg of trimethoprim 347.5 mL/hr over 90 Minutes Intravenous Every 6 hours 11/09/13 2056 11/10/13 1100   11/09/13 1800  sulfamethoxazole-trimethoprim (BACTRIM) 340 mg of trimethoprim in dextrose 5 % 500 mL IVPB  Status:  Discontinued     340 mg of trimethoprim 347.5 mL/hr over 90 Minutes Intravenous 4 times per day 11/09/13 1637 11/09/13 2056   11/07/13 0800  vancomycin (VANCOCIN) IVPB 1000 mg/200 mL premix  Status:  Discontinued     1,000 mg 200 mL/hr over 60 Minutes Intravenous Every 8 hours 11/07/13 0542 11/09/13 1603   11/07/13 0600  levofloxacin (LEVAQUIN) IVPB 750 mg  Status:  Discontinued     750 mg 100 mL/hr over 90 Minutes Intravenous Every 24 hours 11/07/13 0539 11/10/13 1042   11/07/13 0545  vancomycin (VANCOCIN) IVPB 1000 mg/200 mL premix  Status:  Discontinued     1,000 mg 200 mL/hr over 60 Minutes Intravenous  Once 11/07/13 0539 11/07/13 0557   11/07/13 0030  levofloxacin (LEVAQUIN) IVPB 750 mg     750 mg 100 mL/hr over 90 Minutes Intravenous  Once 11/07/13 0029 11/07/13 0235   11/07/13 0030  vancomycin (VANCOCIN) IVPB 1000 mg/200 mL premix     1,000 mg 200 mL/hr over 60 Minutes Intravenous  Once 11/07/13 0029 11/07/13 0405      Medications:  Scheduled: . azithromycin  1,200 mg Oral Weekly  . clindamycin  300 mg Oral 4 times per day  . cyclobenzaprine  10 mg Oral Once  . docusate sodium  100 mg Oral BID  . hydrocortisone   Rectal QID  . hydrocortisone-pramoxine  1 applicator Rectal BID  . nystatin  5 mL  Oral QID  . pantoprazole  40 mg Oral Q1200  . primaquine  30 mg Oral Daily  . sodium bicarbonate  1,300 mg Oral BID  . valGANciclovir  450 mg Oral QODAY    Objective: Vital signs in last 24 hours: Temp:  [98.5 F (36.9 C)-99.3 F (37.4 C)] 99.1 F (37.3 C) (09/25 0454) Pulse Rate:  [99-100] 100 (09/25 0454) Resp:  [17-18] 18 (09/25 0454) BP: (130-139)/(67-85) 139/67 mmHg (09/25 0454) SpO2:  [92 %-99 %] 92 % (09/25 0454) Weight:  [95.2 kg (209 lb 14.1 oz)] 95.2 kg (209 lb 14.1 oz) (09/25 0454)   General appearance: alert, cooperative and no distress Resp: clear to auscultation bilaterally Cardio: regular rate and rhythm GI: normal findings: bowel sounds normal and soft, non-tender Extremities: edema 2+   Lab Results  Recent Labs  11/19/13 0557 11/20/13 0453 11/21/13 0602  WBC  --  7.2  7.1  HGB  --  10.7* 9.3*  HCT  --  31.4* 26.8*  NA 129* 130*  --   K 4.4 4.4  --   CL 99 98  --   CO2 21 21  --   BUN 34* 31*  --   CREATININE 5.11* 4.94*  --    Liver Panel  Recent Labs  11/19/13 0557 11/20/13 0453  ALBUMIN 1.3* 1.5*   Sedimentation Rate No results found for this basename: ESRSEDRATE,  in the last 72 hours C-Reactive Protein No results found for this basename: CRP,  in the last 72 hours  Microbiology: Recent Results (from the past 240 hour(s))  CULTURE, BLOOD (ROUTINE X 2)     Status: None   Collection Time    11/14/13 12:30 PM      Result Value Ref Range Status   Specimen Description BLOOD RIGHT ARM   Final   Special Requests BOTTLES DRAWN AEROBIC ONLY 10CC   Final   Culture  Setup Time     Final   Value: 11/14/2013 21:41     Performed at [facility]   Culture     Final   Value: NO GROWTH 5 DAYS     Performed at [facility]   Report Status 11/20/2013 FINAL   Final  URINE CULTURE     Status: None   Collection Time    11/14/13  5:44 PM      Result Value Ref Range Status   Specimen Description URINE, RANDOM   Final   Special Requests Normal   Final   Culture  Setup Time     Final   Value: 11/14/2013 18:24     Performed at [facility]     Final   Value: NO GROWTH     Performed at [facility]   Culture     Final   Value: NO GROWTH     Performed at [facility]   Report Status 11/15/2013 FINAL   Final  AFB CULTURE, BLOOD     Status: None   Collection Time    11/15/13  8:40 PM      Result Value Ref Range Status   Specimen Description BLOOD RIGHT ARM   Final   Special Requests 5CC AFB   Final   Culture     Final   Value: CULTURE WILL BE EXAMINED FOR 6 WEEKS BEFORE ISSUING A FINAL REPORT     Performed at [facility]   Report Status PENDING   Incomplete  Studies/Results: Dg Chest 2 View  11/19/2013   CLINICAL DATA:  Followup hypoxia.  EXAM: CHEST  2 VIEW  COMPARISON:   11/14/2013.  FINDINGS: Improved perihilar opacities, not yet quite clear consistent with resolving pneumonia. No focal consolidation. Small RIGHT pleural effusion persists. Unchanged cardiomediastinal silhouette.  IMPRESSION: Improved aeration.   Electronically Signed   By: Rolla Flatten M.D.   On: 11/19/2013 12:43     Assessment/Plan: AIDS  Pneumonia  ARF  CMV PCR 89,011  Hemorrhoids Fever w/u negative (BCx, UCx, doppler)   Await renal Bx result Await his HIV genotype- will re-order Cr improving yesterday.   Total days of antibiotics:  9/24 Valcyte  9/16 clinda/primaquine  9/11 levaquin 9/18  9/13 bactrim 9/16          Bobby Rumpf Infectious Diseases (pager) [phone] [email or web address] 11/21/2013, 7:16 AM  LOS: 14 days

## 2013-11-21 NOTE — Plan of Care (Signed)
Problem: Food- and Nutrition-Related Knowledge Deficit (NB-1.1) Goal: Nutrition education Formal process to instruct or train a patient/client in a skill or to impart knowledge to help patients/clients voluntarily manage or modify food choices and eating behavior to maintain or improve health. Outcome: Completed/Met Date Met:  11/21/13  RD consulted for low sodium diet education.  RD provided "Low Sodium Nutrition Therapy" handout from the Academy of [employer]. Reviewed patient's dietary recall. Provided examples on ways to decrease sodium intake in diet. Discouraged intake of processed foods and use of salt shaker. Encouraged fresh fruits and vegetables as well as whole grain sources of carbohydrates to maximize fiber intake. Teach back method used.  Expect fair compliance.  Body mass index is 32.86 kg/(m^2). Pt meets criteria for Obesity Class I based on current BMI.  Current diet order is Regular. Labs and medications reviewed. No further nutrition interventions warranted at this time. If additional nutrition issues arise, please re-consult RD.   Arthur Holms, RD, LDN Pager #: [phone] After-Hours Pager #: [phone]

## 2013-11-21 NOTE — Consult Note (Signed)
Jim Wyatt 1981-02-04  [id number].   Requesting MD: Dr. Domenic Polite Chief Complaint/Reason for Consult: thrombosed hemorrhoid HPI: This is a very pleasant 33 yo black male who was admitted for CAP.  While admitted he was newly diagnosed with HIV as well.  About 4-5 days ago, he began having rectal pain and was noted to have hemorrhoids.  He was started on anusol, hydrocortisone cream, stool softeners, and sitz bathes, although the RN states he has not been doing his sitz bathes.  The patient states that this is not helping and his pain continues to worsen.  He did have a BM yesterday that was soft and no blood was noted.  There was a concern for a thrombosed hemorrhoid and we have been asked to see him today for further evaluation and management.  ROS: Please see HPI, otherwise all other systems have been reviewed and are negative.  History reviewed. No pertinent family history.  Past Medical History  Diagnosis Date  . Asthma   . Pneumonia   . Shingles   . HIV infection     Past Surgical History  Procedure Laterality Date  . Femur fracture surgery      Social History:  reports that he has never smoked. He does not have any smokeless tobacco history on file. He reports that he does not drink alcohol or use illicit drugs.  Allergies:  Allergies  Allergen Reactions  . Augmentin [Amoxicillin-Pot Clavulanate] Other (See Comments)    unknown  . Bactrim [Sulfamethoxazole-Tmp Ds]     Renal failure  . Banana   . Cauliflower [Brassica Oleracea Italica] Other (See Comments)    Pt states his throat closes up  . Other     Patient has multiple food allergies    Medications Prior to Admission  Medication Sig Dispense Refill  . albuterol (PROVENTIL HFA;VENTOLIN HFA) 108 (90 BASE) MCG/ACT inhaler Inhale 2 puffs into the lungs every 6 (six) hours as needed for wheezing or shortness of breath.      . [EXPIRED] doxycycline (VIBRA-TABS) 100 MG tablet Take 100 mg by mouth 2 (two)  times daily. For 7 days      . ibuprofen (ADVIL,MOTRIN) 200 MG tablet Take 600 mg by mouth 2 (two) times daily as needed.        Blood pressure 139/67, pulse 100, temperature 99.1 F (37.3 C), temperature source Oral, resp. rate 18, height _0  (1.702 m), weight 209 lb 14.1 oz (95.2 kg), SpO2 92.00%. Physical Exam: General: pleasant, WD, WN black male who is laying in bed in NAD Heart: regular, rate, and rhythm.  Normal s1,s2. No obvious murmurs, gallops, or rubs noted.  Palpable radial and pedal pulses bilaterally Lungs: CTAB, no wheezes, rhonchi, or rales noted.  Respiratory effort nonlabored Abd: soft, NT, ND, +BS, no masses, hernias, or organomegaly Rectal: multiple external hemorrhoids that range from 1.5 to 3 cm in size circumferentially around his anus.  The largest is noted on the left side, but is soft.  There is one on the right side from 2-4 oclock that is harder and feels to have clot inside.  The other 2 noted at 12 and 6 are smaller and soft as well.  Psych: A&Ox3 with an appropriate affect.    Results for orders placed during the hospital encounter of 11/07/13 (from the past 48 hour(s))  RENAL FUNCTION PANEL     Status: Abnormal   Collection Time    11/20/13  4:53 AM      Result  Value Ref Range   Sodium 130 (*) 137 - 147 mEq/L   Potassium 4.4  3.7 - 5.3 mEq/L   Chloride 98  96 - 112 mEq/L   CO2 21  19 - 32 mEq/L   Glucose, Bld 82  70 - 99 mg/dL   BUN 31 (*) 6 - 23 mg/dL   Creatinine, Ser 4.94 (*) 0.50 - 1.35 mg/dL   Calcium 8.0 (*) 8.4 - 10.5 mg/dL   Phosphorus 4.7 (*) 2.3 - 4.6 mg/dL   Albumin 1.5 (*) 3.5 - 5.2 g/dL   GFR calc non Af Amer 14 (*) >90 mL/min   GFR calc Af Amer 17 (*) >90 mL/min   Comment: (NOTE)     The eGFR has been calculated using the CKD EPI equation.     This calculation has not been validated in all clinical situations.     eGFR's persistently <90 mL/min signify possible Chronic Kidney     Disease.   Anion gap 11  5 - 15  CBC     Status:  Abnormal   Collection Time    11/20/13  4:53 AM      Result Value Ref Range   WBC 7.2  4.0 - 10.5 K/uL   RBC 4.04 (*) 4.22 - 5.81 MIL/uL   Hemoglobin 10.7 (*) 13.0 - 17.0 g/dL   HCT 31.4 (*) 39.0 - 52.0 %   MCV 77.7 (*) 78.0 - 100.0 fL   MCH 26.5  26.0 - 34.0 pg   MCHC 34.1  30.0 - 36.0 g/dL   RDW 15.1  11.5 - 15.5 %   Platelets 312  150 - 400 K/uL  RENAL FUNCTION PANEL     Status: Abnormal   Collection Time    11/21/13  6:02 AM      Result Value Ref Range   Sodium 130 (*) 137 - 147 mEq/L   Potassium 4.5  3.7 - 5.3 mEq/L   Chloride 101  96 - 112 mEq/L   CO2 20  19 - 32 mEq/L   Glucose, Bld 86  70 - 99 mg/dL   BUN 29 (*) 6 - 23 mg/dL   Creatinine, Ser 4.65 (*) 0.50 - 1.35 mg/dL   Calcium 7.6 (*) 8.4 - 10.5 mg/dL   Phosphorus 4.0  2.3 - 4.6 mg/dL   Albumin 1.3 (*) 3.5 - 5.2 g/dL   GFR calc non Af Amer 16 (*) >90 mL/min   GFR calc Af Amer 18 (*) >90 mL/min   Comment: (NOTE)     The eGFR has been calculated using the CKD EPI equation.     This calculation has not been validated in all clinical situations.     eGFR's persistently <90 mL/min signify possible Chronic Kidney     Disease.   Anion gap 9  5 - 15  CBC     Status: Abnormal   Collection Time    11/21/13  6:02 AM      Result Value Ref Range   WBC 7.1  4.0 - 10.5 K/uL   RBC 3.47 (*) 4.22 - 5.81 MIL/uL   Hemoglobin 9.3 (*) 13.0 - 17.0 g/dL   HCT 26.8 (*) 39.0 - 52.0 %   MCV 77.2 (*) 78.0 - 100.0 fL   MCH 26.8  26.0 - 34.0 pg   MCHC 34.7  30.0 - 36.0 g/dL   RDW 14.9  11.5 - 15.5 %   Platelets 256  150 - 400 K/uL   Dg Chest 2 View  11/19/2013   CLINICAL DATA:  Followup hypoxia.  EXAM: CHEST  2 VIEW  COMPARISON:  11/14/2013.  FINDINGS: Improved perihilar opacities, not yet quite clear consistent with resolving pneumonia. No focal consolidation. Small RIGHT pleural effusion persists. Unchanged cardiomediastinal silhouette.  IMPRESSION: Improved aeration.   Electronically Signed   By: Rolla Flatten M.D.   On: 11/19/2013  12:43       Assessment/Plan 1. CAP 2. HIV 3. Multiple external hemorrhoids, with one that is thrombosed  Plan: 1. I have discussed with the patient that I feel the hemorrhoid on the right side needs to be lanced and clot evacuated.  I think this will make the patient's pain better.  He will still need to continue routine hemorrhoidal care as already being implemented after this procedure for his other hemorrhoids along with sitz bathes, which will be the biggest assistance to getting the others better. 2. Procedure planned for this afternoon when tray arrives.  Korri Ask E 11/21/2013, 11:58 AM Pager: [phone]

## 2013-11-22 DIAGNOSIS — M79609 Pain in unspecified limb: Secondary | ICD-10-CM

## 2013-11-22 LAB — RENAL FUNCTION PANEL
Albumin: 1.3 g/dL — ABNORMAL LOW (ref 3.5–5.2)
Anion gap: 11 (ref 5–15)
BUN: 29 mg/dL — ABNORMAL HIGH (ref 6–23)
CO2: 22 mEq/L (ref 19–32)
Calcium: 7.7 mg/dL — ABNORMAL LOW (ref 8.4–10.5)
Chloride: 99 mEq/L (ref 96–112)
Creatinine, Ser: 4.9 mg/dL — ABNORMAL HIGH (ref 0.50–1.35)
GFR calc Af Amer: 17 mL/min — ABNORMAL LOW (ref >90)
GFR calc non Af Amer: 15 mL/min — ABNORMAL LOW (ref >90)
Glucose, Bld: 89 mg/dL (ref 70–99)
Phosphorus: 4.5 mg/dL (ref 2.3–4.6)
Potassium: 4.5 mEq/L (ref 3.7–5.3)
Sodium: 132 mEq/L — ABNORMAL LOW (ref 137–147)

## 2013-11-22 LAB — LACTATE DEHYDROGENASE: LDH: 830 U/L — ABNORMAL HIGH (ref 94–250)

## 2013-11-22 LAB — CBC
HCT: 27.9 % — ABNORMAL LOW (ref 39.0–52.0)
Hemoglobin: 9.3 g/dL — ABNORMAL LOW (ref 13.0–17.0)
MCH: 26.6 pg (ref 26.0–34.0)
MCHC: 33.3 g/dL (ref 30.0–36.0)
MCV: 79.7 fL (ref 78.0–100.0)
Platelets: 278 10*3/uL (ref 150–400)
RBC: 3.5 MIL/uL — ABNORMAL LOW (ref 4.22–5.81)
RDW: 15.1 % (ref 11.5–15.5)
WBC: 9 10*3/uL (ref 4.0–10.5)

## 2013-11-22 LAB — HIV-1 GENOTYPR PLUS

## 2013-11-22 MED ORDER — ALUM & MAG HYDROXIDE-SIMETH 200-200-20 MG/5ML PO SUSP
15.0000 mL | ORAL | Status: DC | PRN
  Administered 2013-11-22: 15 mL via ORAL
  Filled 2013-11-22: qty 30

## 2013-11-22 MED ORDER — ENOXAPARIN SODIUM 100 MG/ML ~~LOC~~ SOLN
1.0000 mg/kg | SUBCUTANEOUS | Status: DC
  Administered 2013-11-22 – 2013-11-24 (×3): 95 mg via SUBCUTANEOUS
  Filled 2013-11-22 (×4): qty 1

## 2013-11-22 NOTE — Progress Notes (Signed)
11/22/2013 8:45 AM Nursing note  pt. Refusing Sitz baths at this time. Education provided to patient, still declined. Will continue to encourage and monitor patient.  Fuad Forget, Arville Lime

## 2013-11-22 NOTE — Progress Notes (Signed)
*  Preliminary Results* Bilateral lower extremity venous duplex completed. Bilateral lower extremities are positive for deep vein thrombosis involving bilateral posterior tibial and peroneal veins. There is no evidence of Baker's cyst bilaterally.  11/22/2013  Maudry Mayhew, RVT, RDCS, RDMS

## 2013-11-22 NOTE — Progress Notes (Signed)
Admit: 11/07/2013 LOS: 15  43M new diagnosis HIV/AIDS and AKI with nephrotic syndrome. 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    Subjective:  S/p  Hemorrhoid procedure with CCS yesterday; some improved discomfort Stable SCR, not better today though Has had mild tachycardia and persistent hypoxia Has bilateral calf pain and is tender to palpation in the calves Started on Lasix yesterday  09/25 0701 - 09/26 0700 In: -  Out: 2550 [Urine:2550]  Filed Weights   11/20/13 0558 11/21/13 0454 11/22/13 0400  Weight: 95.4 kg (210 lb 5.1 oz) 95.2 kg (209 lb 14.1 oz) 93.895 kg (207 lb)    Scheduled Meds: . azithromycin  1,200 mg Oral Weekly  . clindamycin  300 mg Oral 4 times per day  . cyclobenzaprine  10 mg Oral Once  . docusate sodium  100 mg Oral BID  . furosemide  40 mg Oral BID  . hydrocortisone   Rectal QID  . hydrocortisone-pramoxine  1 applicator Rectal BID  . nystatin  5 mL Oral QID  . pantoprazole  40 mg Oral Q1200  . primaquine  30 mg Oral Daily  . sodium bicarbonate  1,300 mg Oral BID  . valGANciclovir  450 mg Oral QODAY   Continuous Infusions:  PRN Meds:.acetaminophen, albuterol, HYDROcodone-acetaminophen, magnesium hydroxide, morphine injection, naphazoline, ondansetron (ZOFRAN) IV, polyethylene glycol, promethazine, witch hazel-glycerin, zolpidem  Current Labs: reviewed   Physical Exam:  Blood pressure 130/67, pulse 107, temperature 100.5 F (38.1 C), temperature source Oral, resp. rate 17, height 5\' 7"  (1.702 m), weight 93.895 kg (207 lb), SpO2 91.00%. NAD, resting comfortably Tachy, nl s1s2, regular CTAB, nl wo TTP in calves b/l, no cords Minimal LEE No rashes/lesions  Assessment 1. AKI 2/2 ATN, collapsing FSGS, normal baseline SCr 2. 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 HIV/AIDs 4. CMV Viremia 5. Persistent hypoxia and tachycardia 6. Calf pain b/l 7. Presumed PCP PNA, on Tx  Plan 1. Renal function is stable, no chagne to lasix for now 2. LE  Dopplers to eval for DVT 3. I favor starting ARVs to treat renal disease 4. Cont lasix for now 5. Outpt f/u with me has been arraged  Pearson Grippe MD 11/22/2013, 8:48 AM   Recent Labs Lab 11/20/13 0453 11/21/13 0602 11/22/13 0412  NA 130* 130* 132*  K 4.4 4.5 4.5  CL 98 101 99  CO2 21 20 22   GLUCOSE 82 86 89  BUN 31* 29* 29*  CREATININE 4.94* 4.65* 4.90*  CALCIUM 8.0* 7.6* 7.7*  PHOS 4.7* 4.0 4.5    Recent Labs Lab 11/20/13 0453 11/21/13 0602 11/22/13 0412  WBC 7.2 7.1 9.0  HGB 10.7* 9.3* 9.3*  HCT 31.4* 26.8* 27.9*  MCV 77.7* 77.2* 79.7  PLT 312 256 278

## 2013-11-22 NOTE — Progress Notes (Signed)
ANTICOAGULATION CONSULT NOTE - Initial Consult  Pharmacy Consult for Lovenox Indication: bilateral DVT  Allergies  Allergen Reactions  . Augmentin [Amoxicillin-Pot Clavulanate] Other (See Comments)    unknown  . Bactrim [Sulfamethoxazole-Tmp Ds]     Renal failure  . Banana   . Cauliflower [Brassica Oleracea Italica] Other (See Comments)    Pt states his throat closes up  . Other     Patient has multiple food allergies    Patient Measurements: Height: 5\' 7"  (170.2 cm) Weight: 207 lb (93.895 kg) IBW/kg (Calculated) : 66.1  Vital Signs: Temp: 100.5 F (38.1 C) (09/26 0400) Temp src: Oral (09/26 0400) BP: 130/67 mmHg (09/26 0400) Pulse Rate: 107 (09/26 0400)  Labs:  Recent Labs  11/20/13 0453 11/21/13 0602 11/22/13 0412  HGB 10.7* 9.3* 9.3*  HCT 31.4* 26.8* 27.9*  PLT 312 256 278  CREATININE 4.94* 4.65* 4.90*    Estimated Creatinine Clearance: 24.1 ml/min (by C-G formula based on Cr of 4.9).   Medical History: Past Medical History  Diagnosis Date  . Asthma   . Pneumonia   . Shingles   . HIV infection     Medications:  Scheduled:  . azithromycin  1,200 mg Oral Weekly  . clindamycin  300 mg Oral 4 times per day  . cyclobenzaprine  10 mg Oral Once  . docusate sodium  100 mg Oral BID  . furosemide  40 mg Oral BID  . hydrocortisone   Rectal QID  . hydrocortisone-pramoxine  1 applicator Rectal BID  . nystatin  5 mL Oral QID  . pantoprazole  40 mg Oral Q1200  . primaquine  30 mg Oral Daily  . sodium bicarbonate  1,300 mg Oral BID  . valGANciclovir  450 mg Oral QODAY    Assessment: 33 year old male admitted with new diagnosis HIV/AIDS and AKI currently being treated for PCP.  He is found to have bilateral DVTs. Note he refused pharmacologic VTE prophylaxis during this admission. He is to begin anticoagulation with Lovenox.  His Lovenox regimen will be adjusted for a low creatinine clearance.  Goal of Therapy:  Anti-Xa level 0.6-1 units/ml 4hrs after  LMWH dose given Monitor platelets by anticoagulation protocol: Yes   Plan:  Lovenox 95mg  SQ q24h (~1mg /kg q24h) CBC q72h  Legrand Como, Pharm.D., BCPS, AAHIVP Clinical Pharmacist Phone: [phone] or [phone] 11/22/2013, 2:02 PM

## 2013-11-22 NOTE — Progress Notes (Signed)
11/22/2013 12:37 PM Nursing note Dr. Broadus John paged and made aware of LE Doppler findings. Will await further orders.  Benay Pomeroy, Arville Lime

## 2013-11-22 NOTE — Progress Notes (Signed)
Subjective: Less hemorrhoid pain  Objective: Vital signs in last 24 hours: Temp:  [98 F (36.7 C)-100.5 F (38.1 C)] 100.5 F (38.1 C) (09/26 0400) Pulse Rate:  [101-388] 107 (09/26 0400) Resp:  [17-19] 17 (09/26 0400) BP: (125-139)/(67-79) 130/67 mmHg (09/26 0400) SpO2:  [91 %-93 %] 91 % (09/26 0400) Weight:  [207 lb (93.895 kg)] 207 lb (93.895 kg) (09/26 0400) Last BM Date: 11/20/13  Intake/Output from previous day: 09/25 0701 - 09/26 0700 In: -  Out: 2550 [Urine:2550] Intake/Output this shift:    Incision/Wound: thrombosed ext hem open with minimal drainage Lab Results:   Recent Labs  11/21/13 0602 11/22/13 0412  WBC 7.1 9.0  HGB 9.3* 9.3*  HCT 26.8* 27.9*  PLT 256 278   BMET  Recent Labs  11/21/13 0602 11/22/13 0412  NA 130* 132*  K 4.5 4.5  CL 101 99  CO2 20 22  GLUCOSE 86 89  BUN 29* 29*  CREATININE 4.65* 4.90*  CALCIUM 7.6* 7.7*   PT/INR No results found for this basename: LABPROT, INR,  in the last 72 hours ABG No results found for this basename: PHART, PCO2, PO2, HCO3,  in the last 72 hours  Studies/Results: No results found.  Anti-infectives: Anti-infectives   Start     Dose/Rate Route Frequency Ordered Stop   11/20/13 1100  valGANciclovir (VALCYTE) 450 MG tablet TABS 450 mg     450 mg Oral Every other day 11/20/13 1007     11/13/13 1100  azithromycin (ZITHROMAX) tablet 1,200 mg     1,200 mg Oral Weekly 11/13/13 0950     11/13/13 0600  levofloxacin (LEVAQUIN) tablet 750 mg  Status:  Discontinued     750 mg Oral Every 48 hours 11/11/13 1306 11/14/13 1050   11/12/13 1800  clindamycin (CLEOCIN) capsule 300 mg     300 mg Oral 4 times per day 11/12/13 1458 11/29/13 1759   11/12/13 1600  primaquine tablet 30 mg     30 mg Oral Daily 11/12/13 1458 11/29/13 0959   11/11/13 1600  sulfamethoxazole-trimethoprim (BACTRIM DS) 800-160 MG per tablet 2 tablet  Status:  Discontinued     2 tablet Oral 3 times daily 11/11/13 1011 11/12/13 1458   11/11/13 0500  levofloxacin (LEVAQUIN) tablet 750 mg  Status:  Discontinued     750 mg Oral Every 24 hours 11/10/13 1042 11/11/13 1306   11/10/13 1200  sulfamethoxazole-trimethoprim (BACTRIM DS) 800-160 MG per tablet 2 tablet  Status:  Discontinued     2 tablet Oral 4 times daily 11/10/13 1100 11/11/13 1011   11/10/13 0200  sulfamethoxazole-trimethoprim (BACTRIM) 340 mg of trimethoprim in dextrose 5 % 500 mL IVPB  Status:  Discontinued     340 mg of trimethoprim 347.5 mL/hr over 90 Minutes Intravenous Every 6 hours 11/09/13 2056 11/10/13 1100   11/09/13 1800  sulfamethoxazole-trimethoprim (BACTRIM) 340 mg of trimethoprim in dextrose 5 % 500 mL IVPB  Status:  Discontinued     340 mg of trimethoprim 347.5 mL/hr over 90 Minutes Intravenous 4 times per day 11/09/13 1637 11/09/13 2056   11/07/13 0800  vancomycin (VANCOCIN) IVPB 1000 mg/200 mL premix  Status:  Discontinued     1,000 mg 200 mL/hr over 60 Minutes Intravenous Every 8 hours 11/07/13 0542 11/09/13 1603   11/07/13 0600  levofloxacin (LEVAQUIN) IVPB 750 mg  Status:  Discontinued     750 mg 100 mL/hr over 90 Minutes Intravenous Every 24 hours 11/07/13 0539 11/10/13 1042   11/07/13 0545  vancomycin (VANCOCIN) IVPB 1000 mg/200 mL premix  Status:  Discontinued     1,000 mg 200 mL/hr over 60 Minutes Intravenous  Once 11/07/13 0539 11/07/13 0557   11/07/13 0030  levofloxacin (LEVAQUIN) IVPB 750 mg     750 mg 100 mL/hr over 90 Minutes Intravenous  Once 11/07/13 0029 11/07/13 0235   11/07/13 0030  vancomycin (VANCOCIN) IVPB 1000 mg/200 mL premix     1,000 mg 200 mL/hr over 60 Minutes Intravenous  Once 11/07/13 0029 11/07/13 0405      Assessment - S/P excision thrombosed ext hemorrhoid. Local care. Cover with gauze until drainage stops. Keep clean. He may call our office if he has further problems.    LOS: 15 days    Kamee Bobst E 11/22/2013

## 2013-11-22 NOTE — Progress Notes (Signed)
PROGRESS NOTE  Jim Wyatt Xxxscotton [name] DOB: 1980/07/27 DOA: 11/07/2013 PCP: No PCP Per Patient  Jim Wyatt is a 33 y.o. male with a past medical history of asthma, and a recently treated community-acquired pneumonia, who presents with a chest pain, shortness of breath and abdominal pain.  The patient was seen on 9/5 and diagnosed with community-acquired pneumonia. He was discharged home with doxycycline.he comes back with persistent recurrent symptoms. 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  ID consulted for his new HIV status. On admission his creatinine was less than 1.   He developed acute renal failure and his creatinine was 2.55 on 9/15. His US renal is negative for hydronephrosis. His creatinine went up to 5.33 over the last one week despite hydration. He continues to have good urine output. Renal consulted and he underwent renal biopsy on 9/21 with results pending. .   Assessment/Plan:  Community acquired pneumonia/probable PCP pneumonia -CT chest and abdomen reveals left pulmonary infiltrates.  -blood culture x 2 NGTD -urine legionella negative, S. pneumococcal antigen negative  -Continue clindamycin and primaquine per ID, ? duration -repeat CXR-improved, wean off O2.   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 due to bactrim, vancomycin and contrast and nsaids  -off nephrotoxic agents -Renal following, urine output good, -S/p renal biopsy on 9/21 -creatinine stable, trended up a little  HIV/AIDS  -newly diagnosed -Appreciate IDinput -per Renal, needs to start HAART due to HIVAN  Hemorrhoids/ extremely tender -thrombosed, s/p evacuation of hematoma per CCS 9/25 -continue anusol, hydrocortisone cream, stool softeners and sitz bath  Calf pain and tenderness -was refusing Heparin SQ and hence was started on SCDs per Dr.Akula last week -check dopplers  Asthma: stable. -Albuterol inhaler PRN  Fever: -improving -on Abx for pneumonia -CMV titres  positive, started Valcyte 9/24  Code Status: full Family Communication: discussed with the mother   Disposition Plan: pending further investigation.   Consultants: Renal ID IR  HPI/Subjective: Complains of calf pain today, hemorroids better but still tender  Objective: Filed Vitals:   11/22/13 0400  BP: 130/67  Pulse: 107  Temp: 100.5 F (38.1 C)  Resp: 17    Intake/Output Summary (Last 24 hours) at 11/22/13 [id number] Last data filed at 11/21/13 2352  Gross per 24 hour  Intake      0 ml  Output   2550 ml  Net  -2550 ml   Filed Weights   11/20/13 0558 11/21/13 0454 11/22/13 0400  Weight: 95.4 kg (210 lb 5.1 oz) 95.2 kg (209 lb 14.1 oz) 93.895 kg (207 lb)    Exam:   General:  A+Ox3, NAD  Cardiovascular: rrr  Respiratory: clear, no wheezing  Abdomen: +BS, soft  Rectal: protuberant large tender hemorrhoids  Musculoskeletal: no edema, mild calf tenderness  Data Reviewed: Basic Metabolic Panel:  Recent Labs Lab 11/18/13 0550 11/19/13 0557 11/20/13 0453 11/21/13 0602 11/22/13 0412  NA 128* 129* 130* 130* 132*  K 4.9 4.4 4.4 4.5 4.5  CL 98 99 98 101 99  CO2 19 21 21 20 22   GLUCOSE 87 86 82 86 89  BUN 35* 34* 31* 29* 29*  CREATININE 5.33* 5.11* 4.94* 4.65* 4.90*  CALCIUM 7.8* 7.6* 8.0* 7.6* 7.7*  PHOS 3.9 4.4 4.7* 4.0 4.5   Liver Function Tests:  Recent Labs Lab 11/17/13 1400 11/18/13 0550 11/19/13 0557 11/20/13 0453 11/21/13 0602 11/22/13 0412  AST 70*  --   --   --   --   --  ALT 50  --   --   --   --   --   ALKPHOS 64  --   --   --   --   --   BILITOT 0.4  --   --   --   --   --   PROT 5.5*  --   --   --   --   --   ALBUMIN 1.4* 1.5* 1.3* 1.5* 1.3* 1.3*    Recent Labs Lab 11/16/13 1157 11/17/13 0540  LIPASE 73* 51   No results found for this basename: AMMONIA,  in the last 168 hours CBC:  Recent Labs Lab 11/17/13 0540 11/18/13 0550 11/20/13 0453 11/21/13 0602 11/22/13 0412  WBC 5.6 6.3 7.2 7.1 9.0  HGB 10.2* 10.5* 10.7*  9.3* 9.3*  HCT 29.1* 30.9* 31.4* 26.8* 27.9*  MCV 76.4* 77.1* 77.7* 77.2* 79.7  PLT 215 253 312 256 278   Cardiac Enzymes:  Recent Labs Lab 11/16/13 1157  TROPONINI <0.30   BNP (last 3 results)  Recent Labs  11/07/13 0855  PROBNP 33.5   CBG: No results found for this basename: GLUCAP,  in the last 168 hours  Recent Results (from the past 240 hour(s))  CULTURE, BLOOD (ROUTINE X 2)     Status: None   Collection Time    11/14/13 12:30 PM      Result Value Ref Range Status   Specimen Description BLOOD RIGHT ARM   Final   Special Requests BOTTLES DRAWN AEROBIC ONLY 10CC   Final   Culture  Setup Time     Final   Value: 11/14/2013 21:41     Performed at [facility]   Culture     Final   Value: NO GROWTH 5 DAYS     Performed at [facility]   Report Status 11/20/2013 FINAL   Final  URINE CULTURE     Status: None   Collection Time    11/14/13  5:44 PM      Result Value Ref Range Status   Specimen Description URINE, RANDOM   Final   Special Requests Normal   Final   Culture  Setup Time     Final   Value: 11/14/2013 18:24     Performed at [facility]     Final   Value: NO GROWTH     Performed at [facility]   Culture     Final   Value: NO GROWTH     Performed at [facility]   Report Status 11/15/2013 FINAL   Final  AFB CULTURE, BLOOD     Status: None   Collection Time    11/15/13  8:40 PM      Result Value Ref Range Status   Specimen Description BLOOD RIGHT ARM   Final   Special Requests 5CC AFB   Final   Culture     Final   Value: CULTURE WILL BE EXAMINED FOR 6 WEEKS BEFORE ISSUING A FINAL REPORT     Performed at [facility]   Report Status PENDING   Incomplete     Studies: No results found.  Scheduled Meds: . azithromycin  1,200 mg Oral Weekly  . clindamycin  300 mg Oral 4 times per day  . cyclobenzaprine  10 mg Oral Once  . docusate sodium  100 mg Oral BID  . furosemide  40 mg Oral BID    . hydrocortisone  Rectal QID  . hydrocortisone-pramoxine  1 applicator Rectal BID  . nystatin  5 mL Oral QID  . pantoprazole  40 mg Oral Q1200  . primaquine  30 mg Oral Daily  . sodium bicarbonate  1,300 mg Oral BID  . valGANciclovir  450 mg Oral QODAY   Continuous Infusions:   Antibiotics Given (last 72 hours)   Date/Time Action Medication Dose   11/19/13 1106 Given   primaquine tablet 30 mg 30 mg   11/19/13 1107 Given   clindamycin (CLEOCIN) capsule 300 mg 300 mg   11/19/13 1835 Given   clindamycin (CLEOCIN) capsule 300 mg 300 mg   11/19/13 2308 Given   clindamycin (CLEOCIN) capsule 300 mg 300 mg   11/20/13 0547 Given   clindamycin (CLEOCIN) capsule 300 mg 300 mg   11/20/13 1159 Given   primaquine tablet 30 mg 30 mg   11/20/13 1203 Given   clindamycin (CLEOCIN) capsule 300 mg 300 mg   11/20/13 1513 Given   valGANciclovir (VALCYTE) 450 MG tablet TABS 450 mg 450 mg   11/20/13 2321 Given   clindamycin (CLEOCIN) capsule 300 mg 300 mg   11/21/13 0557 Given   clindamycin (CLEOCIN) capsule 300 mg 300 mg   11/21/13 1155 Given   clindamycin (CLEOCIN) capsule 300 mg 300 mg   11/21/13 1155 Given   primaquine tablet 30 mg 30 mg   11/21/13 1729 Given   clindamycin (CLEOCIN) capsule 300 mg 300 mg   11/21/13 2348 Given   clindamycin (CLEOCIN) capsule 300 mg 300 mg   11/22/13 [id number] Given   clindamycin (CLEOCIN) capsule 300 mg 300 mg      Principal Problem:   Pneumonia Active Problems:   Asthma   Elevated liver enzymes   History of shingles   Obesity   AKI (acute kidney injury)   Human immunodeficiency virus (HIV) disease    Time spent: 87 min    [place] [facility] Pager [phone]. If 7PM-7AM, please contact night-coverage at [email or web address], password [facility] 11/22/2013, 8:32 AM  LOS: 15 days

## 2013-11-23 ENCOUNTER — Inpatient Hospital Stay (HOSPITAL_COMMUNITY): Payer: Self-pay

## 2013-11-23 ENCOUNTER — Inpatient Hospital Stay (HOSPITAL_COMMUNITY): Payer: MEDICAID

## 2013-11-23 LAB — RENAL FUNCTION PANEL
Albumin: 1.2 g/dL — ABNORMAL LOW (ref 3.5–5.2)
Anion gap: 13 (ref 5–15)
BUN: 30 mg/dL — ABNORMAL HIGH (ref 6–23)
CO2: 20 mEq/L (ref 19–32)
Calcium: 7.5 mg/dL — ABNORMAL LOW (ref 8.4–10.5)
Chloride: 98 mEq/L (ref 96–112)
Creatinine, Ser: 4.85 mg/dL — ABNORMAL HIGH (ref 0.50–1.35)
GFR calc Af Amer: 17 mL/min — ABNORMAL LOW (ref >90)
GFR calc non Af Amer: 15 mL/min — ABNORMAL LOW (ref >90)
Glucose, Bld: 96 mg/dL (ref 70–99)
Phosphorus: 4.9 mg/dL — ABNORMAL HIGH (ref 2.3–4.6)
Potassium: 4.3 mEq/L (ref 3.7–5.3)
Sodium: 131 mEq/L — ABNORMAL LOW (ref 137–147)

## 2013-11-23 LAB — CBC
HCT: 27.2 % — ABNORMAL LOW (ref 39.0–52.0)
Hemoglobin: 9.3 g/dL — ABNORMAL LOW (ref 13.0–17.0)
MCH: 26.4 pg (ref 26.0–34.0)
MCHC: 34.2 g/dL (ref 30.0–36.0)
MCV: 77.3 fL — ABNORMAL LOW (ref 78.0–100.0)
Platelets: 278 10*3/uL (ref 150–400)
RBC: 3.52 MIL/uL — ABNORMAL LOW (ref 4.22–5.81)
RDW: 15 % (ref 11.5–15.5)
WBC: 9.7 10*3/uL (ref 4.0–10.5)

## 2013-11-23 LAB — HIV-1 RNA ULTRAQUANT REFLEX TO GENTYP+
HIV 1 RNA Quant: 98300 copies/mL — ABNORMAL HIGH (ref <20)
HIV-1 RNA Quant, Log: 4.99 {Log} — ABNORMAL HIGH (ref <1.30)

## 2013-11-23 LAB — PROTIME-INR
INR: 1.03 (ref 0.00–1.49)
Prothrombin Time: 13.5 seconds (ref 11.6–15.2)

## 2013-11-23 MED ORDER — COUMADIN BOOK
Freq: Once | Status: AC
  Administered 2013-11-23: 10:00:00
  Filled 2013-11-23: qty 1

## 2013-11-23 MED ORDER — TECHNETIUM TO 99M ALBUMIN AGGREGATED
6.0000 | Freq: Once | INTRAVENOUS | Status: AC | PRN
  Administered 2013-11-23: 6 via INTRAVENOUS

## 2013-11-23 MED ORDER — WARFARIN SODIUM 7.5 MG PO TABS
7.5000 mg | ORAL_TABLET | Freq: Once | ORAL | Status: AC
  Administered 2013-11-23: 7.5 mg via ORAL
  Filled 2013-11-23: qty 1

## 2013-11-23 MED ORDER — WARFARIN - PHARMACIST DOSING INPATIENT: Freq: Every day | Status: DC

## 2013-11-23 MED ORDER — TECHNETIUM TC 99M DIETHYLENETRIAME-PENTAACETIC ACID
40.0000 | Freq: Once | INTRAVENOUS | Status: AC | PRN
  Administered 2013-11-23: 40 via RESPIRATORY_TRACT

## 2013-11-23 MED ORDER — HYDROCORTISONE ACE-PRAMOXINE 1-1 % RE FOAM
1.0000 | Freq: Two times a day (BID) | RECTAL | Status: DC | PRN
  Filled 2013-11-23: qty 10

## 2013-11-23 MED ORDER — WARFARIN VIDEO
Freq: Once | Status: AC
  Administered 2013-11-23: 22:00:00

## 2013-11-23 MED ORDER — HYDROCORTISONE 2.5 % RE CREA
TOPICAL_CREAM | Freq: Two times a day (BID) | RECTAL | Status: DC
Start: 2013-11-23 — End: 2013-11-25
  Administered 2013-11-23: 10:00:00 via RECTAL
  Administered 2013-11-23: 1 via RECTAL
  Administered 2013-11-24: 22:00:00 via RECTAL
  Filled 2013-11-23: qty 28.35

## 2013-11-23 NOTE — Progress Notes (Signed)
ANTICOAGULATION CONSULT NOTE - Initial Consult  Pharmacy Consult for Lovenox + warfarin Indication: bilateral DVT  Allergies  Allergen Reactions  . Augmentin [Amoxicillin-Pot Clavulanate] Other (See Comments)    unknown  . Bactrim [Sulfamethoxazole-Tmp Ds]     Renal failure  . Banana   . Cauliflower [Brassica Oleracea Italica] Other (See Comments)    Pt states his throat closes up  . Other     Patient has multiple food allergies    Patient Measurements: Height: 5\' 7"  (170.2 cm) Weight: 203 lb (92.08 kg) IBW/kg (Calculated) : 66.1  Vital Signs: Temp: 99.7 F (37.6 C) (09/27 0440) Temp src: Oral (09/27 0440) BP: 136/83 mmHg (09/27 0440) Pulse Rate: 101 (09/27 0440)  Labs:  Recent Labs  11/21/13 0602 11/22/13 0412 11/23/13 0416  HGB 9.3* 9.3* 9.3*  HCT 26.8* 27.9* 27.2*  PLT 256 278 278  CREATININE 4.65* 4.90* 4.85*    Estimated Creatinine Clearance: 24.1 ml/min (by C-G formula based on Cr of 4.85).   Medical History: Past Medical History  Diagnosis Date  . Asthma   . Pneumonia   . Shingles   . HIV infection     Medications:  Scheduled:  . azithromycin  1,200 mg Oral Weekly  . clindamycin  300 mg Oral 4 times per day  . cyclobenzaprine  10 mg Oral Once  . docusate sodium  100 mg Oral BID  . enoxaparin (LOVENOX) injection  1 mg/kg Subcutaneous Q24H  . furosemide  40 mg Oral BID  . hydrocortisone   Rectal BID  . nystatin  5 mL Oral QID  . pantoprazole  40 mg Oral Q1200  . primaquine  30 mg Oral Daily  . sodium bicarbonate  1,300 mg Oral BID  . valGANciclovir  450 mg Oral QODAY    Assessment: 33 year old male admitted with new diagnosis HIV/AIDS and AKI currently being treated for PCP.  He was found to have bilateral DVTs.  He is currently on Lovenox and will be bridged to warfarin.  His CBC is stable, hgb moderately low at 9.3, will obtain baseline INR.    Goal of Therapy:  Anti-Xa level 0.6-1 units/ml 4hrs after LMWH dose given Monitor  platelets by anticoagulation protocol: Yes   Plan:  -Warfarin 7.5 mg po x1 -Daily PT/INR -Continue Lovenox 95 mg SQ q24h  -CBC q72h -Discontinue Lovenox when INR > 2 for > 24 hours -Monitor for potential warfarin drug interactions when HAART therapy decided -Monitor for bleeding as pt had minor procedure for thrombosed hemorrhoid on 9/25 -Warfarin book, video ordered, will need education   Hughes Better, PharmD, BCPS Clinical Pharmacist Pager: [phone] 11/23/2013 8:38 AM

## 2013-11-23 NOTE — Progress Notes (Signed)
PROGRESS NOTE  Jim Wyatt [id number] DOB: 08-Apr-1980 DOA: 11/07/2013 PCP: No PCP Per Patient  Jim Wyatt is a 33 y.o. male with a past medical history of asthma, and a recently treated community-acquired pneumonia, who presents with a chest pain, shortness of breath and abdominal pain.  The patient was seen on 9/5 and diagnosed with community-acquired pneumonia. He was discharged home with doxycycline.he comes back with persistent recurrent symptoms. 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  ID consulted for his new HIV status. On admission his creatinine was less than 1.   He developed acute renal failure and his creatinine was 2.55 on 9/15. His US renal is negative for hydronephrosis. His creatinine went up to 5.33 over the last one week despite hydration. He continues to have good urine output. Renal consulted and he underwent renal biopsy on 9/21. Biopsy c/w HIVAN and ATN, creatinine plateaued, then had issues with large painful thrombosed hemorroids which didn't improve with conservative Rx and then seen by [facility] and underwent incision and evacuation of clot. 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  -blood culture x 2 NGTD -urine legionella negative, S. pneumococcal antigen negative  -Continue clindamycin and primaquine per ID, ? duration -repeat CXR-improved, wean off O2.   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 due to bactrim, vancomycin and contrast and nsaids  -off nephrotoxic agents -Renal following, urine output good, -S/p renal biopsy on 9/21 -creatinine plateaued, needs to start HAART  HIV/AIDS  -newly diagnosed -Appreciate IDinput -per Renal, needs to start HAART due to  HIVAN  Hemorrhoids/ extremely tender -thrombosed, s/p evacuation of hematoma per [facility] 9/25 -continue anusol, hydrocortisone cream, stool softeners and sitz bath, pain improving  DVt and suspected PE -pleuritic chest pain overnight -will check VQ scan -was refusing Heparin SQ early on and hence was started on SCDs per Dr.Akula last week -also Hep SQ had to be stopped due to some hematuria and for renal biopsy -continue Lovenox, start Coumadin today -not candidate for NOACs due to AKI  Asthma: stable. -Albuterol inhaler PRN  Fever: -febrile again overnight -continues to be on Abx for probable PCP  Pneumonia per ID -CMV titres positive, started Valcyte 9/24 -FU blood Cx drawn 9/26  Code Status: full Family Communication: discussed with the mother 9/25 Disposition Plan: home in 2-3days if stable  Consultants: Renal ID IR  HPI/Subjective: Pleuritic chest pain since last pm Still with calf pain and tenderness but improving  Objective: Filed Vitals:   11/23/13 0440  BP: 136/83  Pulse: 101  Temp: 99.7 F (37.6 C)  Resp: 18    Intake/Output Summary (Last 24 hours) at 11/23/13 0827 Last data filed at 11/23/13 0441  Gross per 24 hour  Intake    240 ml  Output   2100 ml  Net  -1860 ml   Filed Weights   11/21/13 0454 11/22/13 0400 11/23/13 0440  Weight: 95.2 kg (209 lb 14.1 oz) 93.895 kg (207 lb) 92.08 kg (203 lb)    Exam:   General:  A+Ox3, NAD  Cardiovascular: rrr  Respiratory: clear, no wheezing  Abdomen: +BS, soft  Rectal: protuberant large tender hemorrhoids  Musculoskeletal: no edema, mild calf tenderness  Data Reviewed: Basic Metabolic Panel:  Recent Labs  Lab 11/19/13 0557 11/20/13 0453 11/21/13 0602 11/22/13 0412 11/23/13 0416  NA 129* 130* 130* 132* 131*  K 4.4 4.4 4.5 4.5 4.3  CL 99 98 101 99 98  CO2 21 21 20 22 20   GLUCOSE 86 82 86 89 96  BUN 34* 31* 29* 29* 30*  CREATININE 5.11* 4.94* 4.65* 4.90* 4.85*  CALCIUM 7.6* 8.0* 7.6*  7.7* 7.5*  PHOS 4.4 4.7* 4.0 4.5 4.9*   Liver Function Tests:  Recent Labs Lab 11/17/13 1400  11/19/13 0557 11/20/13 0453 11/21/13 0602 11/22/13 0412 11/23/13 0416  AST 70*  --   --   --   --   --   --   ALT 50  --   --   --   --   --   --   ALKPHOS 64  --   --   --   --   --   --   BILITOT 0.4  --   --   --   --   --   --   PROT 5.5*  --   --   --   --   --   --   ALBUMIN 1.4*  < > 1.3* 1.5* 1.3* 1.3* 1.2*  < > = values in this interval not displayed.  Recent Labs Lab 11/16/13 1157 11/17/13 0540  LIPASE 73* 51   No results found for this basename: AMMONIA,  in the last 168 hours CBC:  Recent Labs Lab 11/18/13 0550 11/20/13 0453 11/21/13 0602 11/22/13 0412 11/23/13 0416  WBC 6.3 7.2 7.1 9.0 9.7  HGB 10.5* 10.7* 9.3* 9.3* 9.3*  HCT 30.9* 31.4* 26.8* 27.9* 27.2*  MCV 77.1* 77.7* 77.2* 79.7 77.3*  PLT 253 312 256 278 278   Cardiac Enzymes:  Recent Labs Lab 11/16/13 1157  TROPONINI <0.30   BNP (last 3 results)  Recent Labs  11/07/13 0855  PROBNP 33.5   CBG: No results found for this basename: GLUCAP,  in the last 168 hours  Recent Results (from the past 240 hour(s))  CULTURE, BLOOD (ROUTINE X 2)     Status: None   Collection Time    11/14/13 12:30 PM      Result Value Ref Range Status   Specimen Description BLOOD RIGHT ARM   Final   Special Requests BOTTLES DRAWN AEROBIC ONLY 10CC   Final   Culture  Setup Time     Final   Value: 11/14/2013 21:41     Performed at [facility]   Culture     Final   Value: NO GROWTH 5 DAYS     Performed at [facility]   Report Status 11/20/2013 FINAL   Final  URINE CULTURE     Status: None   Collection Time    11/14/13  5:44 PM      Result Value Ref Range Status   Specimen Description URINE, RANDOM   Final   Special Requests Normal   Final   Culture  Setup Time     Final   Value: 11/14/2013 18:24     Performed at [place]     Final   Value: NO GROWTH      Performed at [facility]   Culture     Final   Value: NO GROWTH     Performed at [facility]   Report Status 11/15/2013 FINAL   Final  AFB CULTURE, BLOOD     Status:  None   Collection Time    11/15/13  8:40 PM      Result Value Ref Range Status   Specimen Description BLOOD RIGHT ARM   Final   Special Requests 5CC AFB   Final   Culture     Final   Value: CULTURE WILL BE EXAMINED FOR 6 WEEKS BEFORE ISSUING A FINAL REPORT     Performed at [facility]   Report Status PENDING   Incomplete     Studies: No results found.  Scheduled Meds: . azithromycin  1,200 mg Oral Weekly  . clindamycin  300 mg Oral 4 times per day  . cyclobenzaprine  10 mg Oral Once  . docusate sodium  100 mg Oral BID  . enoxaparin (LOVENOX) injection  1 mg/kg Subcutaneous Q24H  . furosemide  40 mg Oral BID  . hydrocortisone   Rectal BID  . nystatin  5 mL Oral QID  . pantoprazole  40 mg Oral Q1200  . primaquine  30 mg Oral Daily  . sodium bicarbonate  1,300 mg Oral BID  . valGANciclovir  450 mg Oral QODAY   Continuous Infusions:   Antibiotics Given (last 72 hours)   Date/Time Action Medication Dose   11/20/13 1159 Given   primaquine tablet 30 mg 30 mg   11/20/13 1203 Given   clindamycin (CLEOCIN) capsule 300 mg 300 mg   11/20/13 1513 Given   valGANciclovir (VALCYTE) 450 MG tablet TABS 450 mg 450 mg   11/20/13 2321 Given   clindamycin (CLEOCIN) capsule 300 mg 300 mg   11/21/13 0557 Given   clindamycin (CLEOCIN) capsule 300 mg 300 mg   11/21/13 1155 Given   clindamycin (CLEOCIN) capsule 300 mg 300 mg   11/21/13 1155 Given   primaquine tablet 30 mg 30 mg   11/21/13 1729 Given   clindamycin (CLEOCIN) capsule 300 mg 300 mg   11/21/13 2348 Given   clindamycin (CLEOCIN) capsule 300 mg 300 mg   11/22/13 0552 Given   clindamycin (CLEOCIN) capsule 300 mg 300 mg   11/22/13 0951 Given   valGANciclovir (VALCYTE) 450 MG tablet TABS 450 mg 450 mg   11/22/13 [id number] Given   primaquine  tablet 30 mg 30 mg   11/22/13 1205 Given   clindamycin (CLEOCIN) capsule 300 mg 300 mg   11/22/13 1713 Given   clindamycin (CLEOCIN) capsule 300 mg 300 mg   11/22/13 2342 Given   clindamycin (CLEOCIN) capsule 300 mg 300 mg   11/23/13 0729 Given  [Pt  refused dose at scheduled time.]   clindamycin (CLEOCIN) capsule 300 mg 300 mg      Principal Problem:   Pneumonia Active Problems:   Asthma   Elevated liver enzymes   History of shingles   Obesity   AKI (acute kidney injury)   Human immunodeficiency virus (HIV) disease    Time spent: 76 min    [place] [facility] Pager [phone]. If 7PM-7AM, please contact night-coverage at [email or web address], password [facility] 11/23/2013, 8:27 AM  LOS: 16 days

## 2013-11-23 NOTE — Progress Notes (Signed)
Jim Wyatt notified of new pain complaint. Pt is asleep without distress at present.

## 2013-11-23 NOTE — Progress Notes (Signed)
Pt awakened by right lateral rib pain 8/10, radiating down right arm, worse with deep breathing. Pain is intensified when I push on the area of right lateral ribs and right upper arm. Pt denies SOB, O2 sat 95% on 2L O2. Medicated for pain.

## 2013-11-23 NOTE — Progress Notes (Signed)
Admit: 11/07/2013 LOS: 16  70M new diagnosis HIV/AIDS and AKI with nephrotic syndrome. 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  B/l DVTs 11/22/13  Subjective:  Found to have b/l DVTs yesterday R sided pleuritic pain overnight Pain in R calf improved, L calf still tender GFR stable, good UOP On LMWH, beginning coumadin Fever overnight  09/26 0701 - 09/27 0700 In: 240 [P.O.:240] Out: 2100 [Urine:2100]  Filed Weights   11/21/13 0454 11/22/13 0400 11/23/13 0440  Weight: 95.2 kg (209 lb 14.1 oz) 93.895 kg (207 lb) 92.08 kg (203 lb)    Scheduled Meds: . azithromycin  1,200 mg Oral Weekly  . clindamycin  300 mg Oral 4 times per day  . coumadin book   Does not apply Once  . cyclobenzaprine  10 mg Oral Once  . docusate sodium  100 mg Oral BID  . enoxaparin (LOVENOX) injection  1 mg/kg Subcutaneous Q24H  . furosemide  40 mg Oral BID  . hydrocortisone   Rectal BID  . nystatin  5 mL Oral QID  . pantoprazole  40 mg Oral Q1200  . primaquine  30 mg Oral Daily  . sodium bicarbonate  1,300 mg Oral BID  . valGANciclovir  450 mg Oral QODAY  . warfarin  7.5 mg Oral ONCE-1800  . warfarin   Does not apply Once  . Warfarin - Pharmacist Dosing Inpatient   Does not apply q1800   Continuous Infusions:  PRN Meds:.acetaminophen, albuterol, alum & mag hydroxide-simeth, HYDROcodone-acetaminophen, hydrocortisone-pramoxine, magnesium hydroxide, morphine injection, naphazoline, ondansetron (ZOFRAN) IV, polyethylene glycol, promethazine, witch hazel-glycerin, zolpidem  Current Labs: reviewed   Physical Exam:  Blood pressure 136/83, pulse 101, temperature 99.7 F (37.6 C), temperature source Oral, resp. rate 18, height 5\' 7"  (1.702 m), weight 92.08 kg (203 lb), SpO2 97.00%. NAD, resting comfortably Tachy, nl s1s2, regular CTAB, nl wo TTP in calves b/l, no cords Minimal LEE No rashes/lesions  Assessment 1. AKI 2/2 ATN, collapsing FSGS, normal baseline SCr 2. 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 HIV/AIDs 4. 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 Persistent hypoxia and tachycardia -- ? PCP or PE 6. Presumed PCP PNA, on Tx 7. B/l DVTs in calves 11/22/13; LMWH + Coumadin; Has pleuritic chest pain 8. Persistent intermittent fevers  Plan 1. Renal function is stable, no chagne to lasix for now 2. Anticoaguation with TRH and Pharm 3. I favor starting ARVs to treat renal disease 4. Cont lasix for now 5. Outpt f/u with me has been arraged  Pearson Grippe MD 11/23/2013, 9:03 AM   Recent Labs Lab 11/21/13 0602 11/22/13 0412 11/23/13 0416  NA 130* 132* 131*  K 4.5 4.5 4.3  CL 101 99 98  CO2 20 22 20   GLUCOSE 86 89 96  BUN 29* 29* 30*  CREATININE 4.65* 4.90* 4.85*  CALCIUM 7.6* 7.7* 7.5*  PHOS 4.0 4.5 4.9*    Recent Labs Lab 11/21/13 0602 11/22/13 0412 11/23/13 0416  WBC 7.1 9.0 9.7  HGB 9.3* 9.3* 9.3*  HCT 26.8* 27.9* 27.2*  MCV 77.2* 79.7 77.3*  PLT 256 278 278

## 2013-11-23 NOTE — Progress Notes (Signed)
Jonette Eva notified of T 101.8. Blood cultures ordered. Pt educated.

## 2013-11-24 DIAGNOSIS — J45909 Unspecified asthma, uncomplicated: Secondary | ICD-10-CM | POA: Diagnosis present

## 2013-11-24 DIAGNOSIS — I82409 Acute embolism and thrombosis of unspecified deep veins of unspecified lower extremity: Secondary | ICD-10-CM | POA: Diagnosis present

## 2013-11-24 LAB — PROTIME-INR
INR: 1.08 (ref 0.00–1.49)
Prothrombin Time: 14 seconds (ref 11.6–15.2)

## 2013-11-24 LAB — RENAL FUNCTION PANEL
Albumin: 1.3 g/dL — ABNORMAL LOW (ref 3.5–5.2)
Anion gap: 14 (ref 5–15)
BUN: 35 mg/dL — ABNORMAL HIGH (ref 6–23)
CO2: 21 mEq/L (ref 19–32)
Calcium: 8.1 mg/dL — ABNORMAL LOW (ref 8.4–10.5)
Chloride: 95 mEq/L — ABNORMAL LOW (ref 96–112)
Creatinine, Ser: 5.02 mg/dL — ABNORMAL HIGH (ref 0.50–1.35)
GFR calc Af Amer: 16 mL/min — ABNORMAL LOW (ref >90)
GFR calc non Af Amer: 14 mL/min — ABNORMAL LOW (ref >90)
Glucose, Bld: 99 mg/dL (ref 70–99)
Phosphorus: 5 mg/dL — ABNORMAL HIGH (ref 2.3–4.6)
Potassium: 4.1 mEq/L (ref 3.7–5.3)
Sodium: 130 mEq/L — ABNORMAL LOW (ref 137–147)

## 2013-11-24 LAB — CBC
HCT: 30.4 % — ABNORMAL LOW (ref 39.0–52.0)
Hemoglobin: 10 g/dL — ABNORMAL LOW (ref 13.0–17.0)
MCH: 25.8 pg — ABNORMAL LOW (ref 26.0–34.0)
MCHC: 32.9 g/dL (ref 30.0–36.0)
MCV: 78.6 fL (ref 78.0–100.0)
Platelets: 346 10*3/uL (ref 150–400)
RBC: 3.87 MIL/uL — ABNORMAL LOW (ref 4.22–5.81)
RDW: 15.1 % (ref 11.5–15.5)
WBC: 9.3 10*3/uL (ref 4.0–10.5)

## 2013-11-24 MED ORDER — LAMIVUDINE 10 MG/ML PO SOLN
100.0000 mg | Freq: Every day | ORAL | Status: DC
  Administered 2013-11-24 – 2013-11-25 (×2): 100 mg via ORAL
  Filled 2013-11-24 (×2): qty 10

## 2013-11-24 MED ORDER — DARUNAVIR ETHANOLATE 800 MG PO TABS: 800.0000 mg | ORAL_TABLET | Freq: Every day | ORAL | Status: DC

## 2013-11-24 MED ORDER — ABACAVIR SULFATE 300 MG PO TABS
600.0000 mg | ORAL_TABLET | Freq: Every day | ORAL | Status: DC
  Administered 2013-11-24 – 2013-11-25 (×2): 600 mg via ORAL
  Filled 2013-11-24 (×2): qty 2

## 2013-11-24 MED ORDER — DARUNAVIR ETHANOLATE 800 MG PO TABS
800.0000 mg | ORAL_TABLET | Freq: Every day | ORAL | Status: DC
  Administered 2013-11-24 – 2013-11-25 (×2): 800 mg via ORAL
  Filled 2013-11-24 (×3): qty 1

## 2013-11-24 MED ORDER — RITONAVIR 100 MG PO TABS
100.0000 mg | ORAL_TABLET | Freq: Every day | ORAL | Status: DC
  Administered 2013-11-24 – 2013-11-25 (×2): 100 mg via ORAL
  Filled 2013-11-24 (×3): qty 1

## 2013-11-24 MED ORDER — WARFARIN SODIUM 7.5 MG PO TABS
7.5000 mg | ORAL_TABLET | Freq: Once | ORAL | Status: AC
  Administered 2013-11-24: 7.5 mg via ORAL
  Filled 2013-11-24: qty 1

## 2013-11-24 NOTE — Progress Notes (Addendum)
PROGRESS NOTE  Jim Wyatt [id number] DOB: 1981/02/19 DOA: 11/07/2013 PCP: No PCP Per Patient  Jim Wyatt is a 33 y.o. male with a past medical history of asthma, and a recently treated community-acquired pneumonia, who presents with a chest pain, shortness of breath and abdominal pain.  The patient was seen on 9/5 and diagnosed with community-acquired pneumonia. He was discharged home with doxycycline.he comes back with persistent recurrent symptoms. 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  ID consulted for his new HIV status. On admission his creatinine was less than 1.   He developed acute renal failure and his creatinine was 2.55 on 9/15. His US renal is negative for hydronephrosis. His creatinine went up to 5.33 over the last one week despite hydration. He continues to have good urine output. Renal consulted and he underwent renal biopsy on 9/21. Biopsy c/w HIVAN and ATN, creatinine plateaued, then had issues with large painful thrombosed hemorroids which didn't improve with conservative Rx and then seen by [facility] and underwent incision and evacuation of clot. 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  -blood culture x 2 NGTD -urine legionella negative, S. pneumococcal antigen negative  -Continue clindamycin and primaquine per ID, for 21days total -repeat CXR-improved, weaned off O2.   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 due to bactrim, vancomycin and contrast and nsaids  -off nephrotoxic agents -Renal following, urine output good, -S/p renal biopsy on 9/21 -creatinine plateaued, needs to start HAART, D/w dr.Hatcher  HIV/AIDS  -newly diagnosed -ID following -per Renal, needs to start  HAART due to HIVAN  Hemorrhoids/ extremely tender -thrombosed, s/p evacuation of hematoma per [facility] 9/25 -continue anusol, hydrocortisone cream, stool softeners and sitz bath, pain improving  DVt and suspected PE -pleuritic chest pain overnight 9/26, VQ scan normal -was refusing Heparin SQ early on and hence was started on SCDs per Dr.Akula last week -also Hep SQ had to be stopped due to some hematuria/hemorroids and for renal biopsy -continue Lovenox bridge, started Coumadin 9/27 -not candidate for NOACs due to AKI  Asthma: stable. -Albuterol inhaler PRN  Fever: -febrile again overnight 9/26 -continues to be on Abx for probable PCP Pneumonia per ID -CMV titres positive, started Valcyte 9/24 -Blood Cx drawn 9/26 negative  Code Status: full Family Communication: discussed with the mother 9/25 Disposition Plan: home tomorrow if stable  Consultants: Renal ID IR  HPI/Subjective: Pleuritic chest pain better Still with calf pain and tenderness but improving  Objective: Filed Vitals:   11/24/13 0505  BP: 139/80  Pulse: 99  Temp: 98.6 F (37 C)  Resp: 18    Intake/Output Summary (Last 24 hours) at 11/24/13 1019 Last data filed at 11/24/13 0800  Gross per 24 hour  Intake    120 ml  Output   1600 ml  Net  -1480 ml   Filed Weights   11/22/13 0400 11/23/13 0440 11/24/13 0505  Weight: 93.895 kg (207 lb) 92.08 kg (203 lb) 91.354 kg (201 lb 6.4 oz)    Exam:   General:  A+Ox3, NAD  Cardiovascular: rrr  Respiratory: clear, no wheezing  Abdomen: +BS, soft  Rectal: improved hemorroids  Musculoskeletal: mild edema, calf tenderness improving  Data Reviewed: Basic Metabolic Panel:  Recent Labs Lab  11/20/13 0453 11/21/13 0602 11/22/13 0412 11/23/13 0416 11/24/13 0535  NA 130* 130* 132* 131* 130*  K 4.4 4.5 4.5 4.3 4.1  CL 98 101 99 98 95*  CO2 21 20 22 20 21   GLUCOSE 82 86 89 96 99  BUN 31* 29* 29* 30* 35*  CREATININE 4.94* 4.65* 4.90* 4.85* 5.02*    CALCIUM 8.0* 7.6* 7.7* 7.5* 8.1*  PHOS 4.7* 4.0 4.5 4.9* 5.0*   Liver Function Tests:  Recent Labs Lab 11/17/13 1400  11/20/13 0453 11/21/13 0602 11/22/13 0412 11/23/13 0416 11/24/13 0535  AST 70*  --   --   --   --   --   --   ALT 50  --   --   --   --   --   --   ALKPHOS 64  --   --   --   --   --   --   BILITOT 0.4  --   --   --   --   --   --   PROT 5.5*  --   --   --   --   --   --   ALBUMIN 1.4*  < > 1.5* 1.3* 1.3* 1.2* 1.3*  < > = values in this interval not displayed. No results found for this basename: LIPASE, AMYLASE,  in the last 168 hours No results found for this basename: AMMONIA,  in the last 168 hours CBC:  Recent Labs Lab 11/20/13 0453 11/21/13 0602 11/22/13 0412 11/23/13 0416 11/24/13 0535  WBC 7.2 7.1 9.0 9.7 9.3  HGB 10.7* 9.3* 9.3* 9.3* 10.0*  HCT 31.4* 26.8* 27.9* 27.2* 30.4*  MCV 77.7* 77.2* 79.7 77.3* 78.6  PLT 312 256 278 278 346   Cardiac Enzymes: No results found for this basename: CKTOTAL, CKMB, CKMBINDEX, TROPONINI,  in the last 168 hours BNP (last 3 results)  Recent Labs  11/07/13 0855  PROBNP 33.5   CBG: No results found for this basename: GLUCAP,  in the last 168 hours  Recent Results (from the past 240 hour(s))  CULTURE, BLOOD (ROUTINE X 2)     Status: None   Collection Time    11/14/13 12:30 PM      Result Value Ref Range Status   Specimen Description BLOOD RIGHT ARM   Final   Special Requests BOTTLES DRAWN AEROBIC ONLY 10CC   Final   Culture  Setup Time     Final   Value: 11/14/2013 21:41     Performed at [facility]   Culture     Final   Value: NO GROWTH 5 DAYS     Performed at [facility]   Report Status 11/20/2013 FINAL   Final  URINE CULTURE     Status: None   Collection Time    11/14/13  5:44 PM      Result Value Ref Range Status   Specimen Description URINE, RANDOM   Final   Special Requests Normal   Final   Culture  Setup Time     Final   Value: 11/14/2013 18:24     Performed at  [place]     Final   Value: NO GROWTH     Performed at [facility]   Culture     Final   Value: NO GROWTH     Performed at [facility]   Report Status 11/15/2013 FINAL   Final  AFB CULTURE, BLOOD  Status: None   Collection Time    11/15/13  8:40 PM      Result Value Ref Range Status   Specimen Description BLOOD RIGHT ARM   Final   Special Requests 5CC AFB   Final   Culture     Final   Value: CULTURE WILL BE EXAMINED FOR 6 WEEKS BEFORE ISSUING A FINAL REPORT     Performed at [facility]   Report Status PENDING   Incomplete  CULTURE, BLOOD (SINGLE)     Status: None   Collection Time    11/22/13  9:30 PM      Result Value Ref Range Status   Specimen Description BLOOD RIGHT HAND   Final   Special Requests BOTTLES DRAWN AEROBIC AND ANAEROBIC 5CC EACH   Final   Culture  Setup Time     Final   Value: 11/23/2013 03:31     Performed at [facility]   Culture     Final   Value:        BLOOD CULTURE RECEIVED NO GROWTH TO DATE CULTURE WILL BE HELD FOR 5 DAYS BEFORE ISSUING A FINAL NEGATIVE REPORT     Performed at [facility]   Report Status PENDING   Incomplete  CULTURE, BLOOD (SINGLE)     Status: None   Collection Time    11/23/13  9:50 AM      Result Value Ref Range Status   Specimen Description BLOOD RIGHT ARM   Final   Special Requests BOTTLES DRAWN AEROBIC AND ANAEROBIC 10CC   Final   Culture  Setup Time     Final   Value: 11/23/2013 16:40     Performed at [facility]   Culture     Final   Value:        BLOOD CULTURE RECEIVED NO GROWTH TO DATE CULTURE WILL BE HELD FOR 5 DAYS BEFORE ISSUING A FINAL NEGATIVE REPORT     Performed at [facility]   Report Status PENDING   Incomplete     Studies: Dg Chest 1 View  11/23/2013   CLINICAL DATA:  Right-sided chest pain.  EXAM: CHEST - 1 VIEW  COMPARISON:  11/19/2013.  FINDINGS: Mediastinum and hilar structures are normal. Cardiomegaly with mild  pulmonary venous congestion. Mild interstitial prominence. Findings consistent mild CHF. No pleural effusion pneumothorax. No acute osseus abnormality.  IMPRESSION: Findings consistent with mild congestive heart failure.   Electronically Signed   By: Marcello Moores  Register   On: 11/23/2013 12:35   Nm Pulmonary Perf And Vent  11/23/2013   CLINICAL DATA:  Pleuritic chest pain, DVT, question pulmonary embolism  EXAM: NUCLEAR MEDICINE VENTILATION - PERFUSION LUNG SCAN  TECHNIQUE: Ventilation images were obtained in multiple projections using inhaled aerosol technetium 99 M DTPA. Perfusion images were obtained in multiple projections after intravenous injection of Tc-2m MAA.  RADIOPHARMACEUTICALS:  40 mCi Tc-90m DTPA aerosol inhalation and 6 mCi Tc-9m MAA IV  COMPARISON:  None ; correlation chest radiograph 11/23/2013  FINDINGS: Ventilation: Mild central airway deposition of aerosol. Swallowed aerosol within stomach. Small subsegmental ventilatory defects identified at the lower lobes.  Perfusion: Normal perfusion images.  Chest radiograph significant for enlargement of cardiac silhouette and question minimal failure.  IMPRESSION: Normal perfusion lung scan.   Electronically Signed   By: Lavonia Dana M.D.   On: 11/23/2013 12:58    Scheduled Meds: . azithromycin  1,200 mg Oral Weekly  . clindamycin  300 mg Oral 4 times  per day  . cyclobenzaprine  10 mg Oral Once  . docusate sodium  100 mg Oral BID  . enoxaparin (LOVENOX) injection  1 mg/kg Subcutaneous Q24H  . furosemide  40 mg Oral BID  . hydrocortisone   Rectal BID  . nystatin  5 mL Oral QID  . pantoprazole  40 mg Oral Q1200  . primaquine  30 mg Oral Daily  . sodium bicarbonate  1,300 mg Oral BID  . valGANciclovir  450 mg Oral QODAY  . warfarin  7.5 mg Oral ONCE-1800  . Warfarin - Pharmacist Dosing Inpatient   Does not apply q1800   Continuous Infusions:   Antibiotics Given (last 72 hours)   Date/Time Action Medication Dose   11/21/13 1155 Given    clindamycin (CLEOCIN) capsule 300 mg 300 mg   11/21/13 1155 Given   primaquine tablet 30 mg 30 mg   11/21/13 1729 Given   clindamycin (CLEOCIN) capsule 300 mg 300 mg   11/21/13 2348 Given   clindamycin (CLEOCIN) capsule 300 mg 300 mg   11/22/13 0552 Given   clindamycin (CLEOCIN) capsule 300 mg 300 mg   11/22/13 0951 Given   valGANciclovir (VALCYTE) 450 MG tablet TABS 450 mg 450 mg   11/22/13 0952 Given   primaquine tablet 30 mg 30 mg   11/22/13 1205 Given   clindamycin (CLEOCIN) capsule 300 mg 300 mg   11/22/13 1713 Given   clindamycin (CLEOCIN) capsule 300 mg 300 mg   11/22/13 2342 Given   clindamycin (CLEOCIN) capsule 300 mg 300 mg   11/23/13 0729 Given  [Pt  refused dose at scheduled time.]   clindamycin (CLEOCIN) capsule 300 mg 300 mg   11/23/13 1000 Given   primaquine tablet 30 mg 30 mg   11/23/13 1200 Given   clindamycin (CLEOCIN) capsule 300 mg 300 mg   11/23/13 1842 Given   clindamycin (CLEOCIN) capsule 300 mg 300 mg   11/24/13 0132 Given   clindamycin (CLEOCIN) capsule 300 mg 300 mg   11/24/13 [id number] Given   clindamycin (CLEOCIN) capsule 300 mg 300 mg      Principal Problem:   Pneumonia Active Problems:   Asthma   Elevated liver enzymes   History of shingles   Obesity   AKI (acute kidney injury)   Human immunodeficiency virus (HIV) disease    Time spent: 47 min    [facility] Pager [phone]. If 7PM-7AM, please contact night-coverage at [email or web address], password [facility] 11/24/2013, 10:19 AM  LOS: 17 days

## 2013-11-24 NOTE — Progress Notes (Signed)
INFECTIOUS DISEASE PROGRESS NOTE  ID: Jim Wyatt is a 33 y.o. male with  Principal Problem:   Pneumonia Active Problems:   Asthma   Elevated liver enzymes   History of shingles   Obesity   AKI (acute kidney injury)   Human immunodeficiency virus (HIV) disease  Subjective: C/o R sided pleuritic CP  Abtx:  Anti-infectives   Start     Dose/Rate Route Frequency Ordered Stop   11/20/13 1100  valGANciclovir (VALCYTE) 450 MG tablet TABS 450 mg     450 mg Oral Every other day 11/20/13 1007     11/13/13 1100  azithromycin (ZITHROMAX) tablet 1,200 mg     1,200 mg Oral Weekly 11/13/13 0950     11/13/13 0600  levofloxacin (LEVAQUIN) tablet 750 mg  Status:  Discontinued     750 mg Oral Every 48 hours 11/11/13 1306 11/14/13 1050   11/12/13 1800  clindamycin (CLEOCIN) capsule 300 mg     300 mg Oral 4 times per day 11/12/13 1458 11/29/13 1759   11/12/13 1600  primaquine tablet 30 mg     30 mg Oral Daily 11/12/13 1458 11/29/13 0959   11/11/13 1600  sulfamethoxazole-trimethoprim (BACTRIM DS) 800-160 MG per tablet 2 tablet  Status:  Discontinued     2 tablet Oral 3 times daily 11/11/13 1011 11/12/13 1458   11/11/13 0500  levofloxacin (LEVAQUIN) tablet 750 mg  Status:  Discontinued     750 mg Oral Every 24 hours 11/10/13 1042 11/11/13 1306   11/10/13 1200  sulfamethoxazole-trimethoprim (BACTRIM DS) 800-160 MG per tablet 2 tablet  Status:  Discontinued     2 tablet Oral 4 times daily 11/10/13 1100 11/11/13 1011   11/10/13 0200  sulfamethoxazole-trimethoprim (BACTRIM) 340 mg of trimethoprim in dextrose 5 % 500 mL IVPB  Status:  Discontinued     340 mg of trimethoprim 347.5 mL/hr over 90 Minutes Intravenous Every 6 hours 11/09/13 2056 11/10/13 1100   11/09/13 1800  sulfamethoxazole-trimethoprim (BACTRIM) 340 mg of trimethoprim in dextrose 5 % 500 mL IVPB  Status:  Discontinued     340 mg of trimethoprim 347.5 mL/hr over 90 Minutes Intravenous 4 times per day 11/09/13 1637 11/09/13 2056     11/07/13 0800  vancomycin (VANCOCIN) IVPB 1000 mg/200 mL premix  Status:  Discontinued     1,000 mg 200 mL/hr over 60 Minutes Intravenous Every 8 hours 11/07/13 0542 11/09/13 1603   11/07/13 0600  levofloxacin (LEVAQUIN) IVPB 750 mg  Status:  Discontinued     750 mg 100 mL/hr over 90 Minutes Intravenous Every 24 hours 11/07/13 0539 11/10/13 1042   11/07/13 0545  vancomycin (VANCOCIN) IVPB 1000 mg/200 mL premix  Status:  Discontinued     1,000 mg 200 mL/hr over 60 Minutes Intravenous  Once 11/07/13 0539 11/07/13 0557   11/07/13 0030  levofloxacin (LEVAQUIN) IVPB 750 mg     750 mg 100 mL/hr over 90 Minutes Intravenous  Once 11/07/13 0029 11/07/13 0235   11/07/13 0030  vancomycin (VANCOCIN) IVPB 1000 mg/200 mL premix     1,000 mg 200 mL/hr over 60 Minutes Intravenous  Once 11/07/13 0029 11/07/13 0405      Medications:  Scheduled: . azithromycin  1,200 mg Oral Weekly  . clindamycin  300 mg Oral 4 times per day  . cyclobenzaprine  10 mg Oral Once  . docusate sodium  100 mg Oral BID  . enoxaparin (LOVENOX) injection  1 mg/kg Subcutaneous Q24H  . furosemide  40 mg Oral  BID  . hydrocortisone   Rectal BID  . nystatin  5 mL Oral QID  . pantoprazole  40 mg Oral Q1200  . primaquine  30 mg Oral Daily  . sodium bicarbonate  1,300 mg Oral BID  . valGANciclovir  450 mg Oral QODAY  . warfarin  7.5 mg Oral ONCE-1800  . Warfarin - Pharmacist Dosing Inpatient   Does not apply q1800    Objective: Vital signs in last 24 hours: Temp:  [98.6 F (37 C)-100.1 F (37.8 C)] 98.6 F (37 C) (09/28 0505) Pulse Rate:  [99-111] 99 (09/28 0505) Resp:  [18-20] 18 (09/28 0505) BP: (130-139)/(70-81) 139/80 mmHg (09/28 0505) SpO2:  [90 %-95 %] 90 % (09/28 0505) Weight:  [91.354 kg (201 lb 6.4 oz)] 91.354 kg (201 lb 6.4 oz) (09/28 0505)   General appearance: alert, cooperative and no distress Resp: clear to auscultation bilaterally Cardio: regular rate and rhythm GI: normal findings: bowel sounds  normal and soft, non-tender Extremities: swollen BLE. L calf tender.   Lab Results  Recent Labs  11/23/13 0416 11/24/13 0535  WBC 9.7 9.3  HGB 9.3* 10.0*  HCT 27.2* 30.4*  NA 131* 130*  K 4.3 4.1  CL 98 95*  CO2 20 21  BUN 30* 35*  CREATININE 4.85* 5.02*   Liver Panel  Recent Labs  11/23/13 0416 11/24/13 0535  ALBUMIN 1.2* 1.3*   Sedimentation Rate No results found for this basename: ESRSEDRATE,  in the last 72 hours C-Reactive Protein No results found for this basename: CRP,  in the last 72 hours  Microbiology: Recent Results (from the past 240 hour(s))  CULTURE, BLOOD (ROUTINE X 2)     Status: None   Collection Time    11/14/13 12:30 PM      Result Value Ref Range Status   Specimen Description BLOOD RIGHT ARM   Final   Special Requests BOTTLES DRAWN AEROBIC ONLY 10CC   Final   Culture  Setup Time     Final   Value: 11/14/2013 21:41     Performed at [facility]   Culture     Final   Value: NO GROWTH 5 DAYS     Performed at [facility]   Report Status 11/20/2013 FINAL   Final  URINE CULTURE     Status: None   Collection Time    11/14/13  5:44 PM      Result Value Ref Range Status   Specimen Description URINE, RANDOM   Final   Special Requests Normal   Final   Culture  Setup Time     Final   Value: 11/14/2013 18:24     Performed at [facility]     Final   Value: NO GROWTH     Performed at [facility]   Culture     Final   Value: NO GROWTH     Performed at [facility]   Report Status 11/15/2013 FINAL   Final  AFB CULTURE, BLOOD     Status: None   Collection Time    11/15/13  8:40 PM      Result Value Ref Range Status   Specimen Description BLOOD RIGHT ARM   Final   Special Requests 5CC AFB   Final   Culture     Final   Value: CULTURE WILL BE EXAMINED FOR 6 WEEKS BEFORE ISSUING A FINAL REPORT     Performed at [facility]  Report Status PENDING   Incomplete  CULTURE, BLOOD  (SINGLE)     Status: None   Collection Time    11/22/13  9:30 PM      Result Value Ref Range Status   Specimen Description BLOOD RIGHT HAND   Final   Special Requests BOTTLES DRAWN AEROBIC AND ANAEROBIC [facility] EACH   Final   Culture  Setup Time     Final   Value: 11/23/2013 03:31     Performed at [facility]   Culture     Final   Value:        BLOOD CULTURE RECEIVED NO GROWTH TO DATE CULTURE WILL BE HELD FOR 5 DAYS BEFORE ISSUING A FINAL NEGATIVE REPORT     Performed at [facility]   Report Status PENDING   Incomplete  CULTURE, BLOOD (SINGLE)     Status: None   Collection Time    11/23/13  9:50 AM      Result Value Ref Range Status   Specimen Description BLOOD RIGHT ARM   Final   Special Requests BOTTLES DRAWN AEROBIC AND ANAEROBIC 10CC   Final   Culture  Setup Time     Final   Value: 11/23/2013 16:40     Performed at [facility]   Culture     Final   Value:        BLOOD CULTURE RECEIVED NO GROWTH TO DATE CULTURE WILL BE HELD FOR 5 DAYS BEFORE ISSUING A FINAL NEGATIVE REPORT     Performed at [facility]   Report Status PENDING   Incomplete    Studies/Results: Dg Chest 1 View  11/23/2013   CLINICAL DATA:  Right-sided chest pain.  EXAM: CHEST - 1 VIEW  COMPARISON:  11/19/2013.  FINDINGS: Mediastinum and hilar structures are normal. Cardiomegaly with mild pulmonary venous congestion. Mild interstitial prominence. Findings consistent mild CHF. No pleural effusion pneumothorax. No acute osseus abnormality.  IMPRESSION: Findings consistent with mild congestive heart failure.   Electronically Signed   By: Marcello Moores  Register   On: 11/23/2013 12:35   Nm Pulmonary Perf And Vent  11/23/2013   CLINICAL DATA:  Pleuritic chest pain, DVT, question pulmonary embolism  EXAM: NUCLEAR MEDICINE VENTILATION - PERFUSION LUNG SCAN  TECHNIQUE: Ventilation images were obtained in multiple projections using inhaled aerosol technetium 99 M DTPA. Perfusion images were obtained in  multiple projections after intravenous injection of Tc-64mMAA.  RADIOPHARMACEUTICALS:  40 mCi Tc-957mTPA aerosol inhalation and 6 mCi Tc-9971mA IV  COMPARISON:  None ; correlation chest radiograph 11/23/2013  FINDINGS: Ventilation: Mild central airway deposition of aerosol. Swallowed aerosol within stomach. Small subsegmental ventilatory defects identified at the lower lobes.  Perfusion: Normal perfusion images.  Chest radiograph significant for enlargement of cardiac silhouette and question minimal failure.  IMPRESSION: Normal perfusion lung scan.   Electronically Signed   By: MarLavonia DanaD.   On: 11/23/2013 12:58     Assessment/Plan: AIDS (genotype naive, HLA -) Pneumonia, ? PCP ARF- Bx  FSGS, HIVAN Bilateral DVT 11-22-13 CMV PCR 89,011  Hemorrhoids  Severe protein-alb malnutrition  Total days of antibiotics:  9/24 Valcyte  9/16 clinda/primaquine  9/11 levaquin 9/18  9/13 bactrim 9/16  Needs to continue clinda/primaquine til 10-4 Will start him on darunavir/norvir/abacavir/epivir He has insurance, gets rx's from [facility].  heparin/coumadin for new DVT         [facility] (pager) [phone] 11/24/2013, 10:30 AM  LOS: 17 days

## 2013-11-24 NOTE — Progress Notes (Signed)
ANTICOAGULATION CONSULT NOTE - Follow Up Consult  Pharmacy Consult for Lovenox and Coumadin Indication: DVT, bilateral  Allergies  Allergen Reactions  . Augmentin [Amoxicillin-Pot Clavulanate] Other (See Comments)    unknown  . Bactrim [Sulfamethoxazole-Tmp Ds]     Renal failure  . Banana   . Cauliflower [Brassica Oleracea Italica] Other (See Comments)    Pt states his throat closes up  . Other     Patient has multiple food allergies    Patient Measurements: Height: 5\' 7"  (170.2 cm) Weight: 201 lb 6.4 oz (91.354 kg) IBW/kg (Calculated) : 66.1  Vital Signs: Temp: 98.7 F (37.1 C) (09/28 1407) Temp src: Oral (09/28 1407) BP: 130/81 mmHg (09/28 1407) Pulse Rate: 115 (09/28 1407)  Labs:  Recent Labs  11/22/13 0412 11/23/13 0416 11/23/13 0955 11/24/13 0535  HGB 9.3* 9.3*  --  10.0*  HCT 27.9* 27.2*  --  30.4*  PLT 278 278  --  346  LABPROT  --   --  13.5 14.0  INR  --   --  1.03 1.08  CREATININE 4.90* 4.85*  --  5.02*    Estimated Creatinine Clearance: 23.2 ml/min (by C-G formula based on Cr of 5.02).  Assessment:  Day # 2 overlap Lovenox and Coumadin for bilateral DVT. INR essentially unchanged after Coumadin 7.5 mg x 1 on 11/23/13.  HAART added today.  Ritonavir may effect Coumadin needs, as it can induce metabolism. May increase Coumadin needs during initiation.  If Ritonavir is stopped when therapeutic on Coumadin, an increase in INR would be expected.   Goal of Therapy:  INR 2-3 Anti-Xa level 0.6-1 units/ml 4hrs after LMWH dose given Monitor platelets by anticoagulation protocol: Yes   Plan:   Repeat Coumadin 7.5 mg tonight.  Continue Lovenox 95 mg SQ q24hrs.  Continue Lovenox a minumum of 5 days and until INR > 2 for 24 hrs.  Arty Baumgartner, [place] Pager: [phone] 11/24/2013,4:06 PM

## 2013-11-24 NOTE — Progress Notes (Signed)
[facility] Rounding Note   Subjective:  Pt seen and examined in AM. No acute events overnight. He continues to make good urine output (1.6L out) and denies hematuria or other urinary symptoms. He continues to have right sided pleuritic chest pain. VQ scan did not reveal evidence of PE. He continues to have left LE pain and swelling. He is on lovenox and coumadin. He denies bleeding.    Objective:   Vital signs in last 24 hours: Filed Vitals:   11/23/13 0440 11/23/13 1351 11/23/13 2109 11/24/13 0505  BP: 136/83 133/70 130/81 139/80  Pulse: 101 111 109 99  Temp: 99.7 F (37.6 C) 100.1 F (37.8 C) 99.7 F (37.6 C) 98.6 F (37 C)  TempSrc: Oral Oral Oral Oral  Resp: 18 19 20 18   Height:      Weight: 203 lb (92.08 kg)   201 lb 6.4 oz (91.354 kg)  SpO2: 97% 95% 95% 90%   Weight change: -1 lb 9.6 oz (-0.726 kg)  Intake/Output Summary (Last 24 hours) at 11/24/13 0734 Last data filed at 11/24/13 0506  Gross per 24 hour  Intake      0 ml  Output   1600 ml  Net  -1600 ml    Physical Exam:  Blood pressure 139/80, pulse 99, temperature 98.6 F (37 C), temperature source Oral, resp. rate 18, height 5\' 7"  (1.702 m), weight 201 lb 6.4 oz (91.354 kg), SpO2 90.00%.  General: No acute distress, breathing on oxygen via [place] HEENT: No oral thrush Cardiac: Normal rate and regular rhythm  Pulmonary: Crackles at left base. No wheezing, ronchi, or rales GI: Ventral hernia (midline) with tenderness to palpation. Normal BS.  Extremities: Nonpitting b/l LE edema with tenderness to palpation of left LE  Labs:   Recent Labs Lab 11/18/13 0550 11/19/13 0557 11/20/13 0453 11/21/13 0602 11/22/13 0412 11/23/13 0416 11/24/13 0535  NA 128* 129* 130* 130* 132* 131* 130*  K 4.9 4.4 4.4 4.5 4.5 4.3 4.1  CL 98 99 98 101 99 98 95*  CO2 19 21 21 20 22 20 21   GLUCOSE 87 86 82 86 89 96 99  BUN 35* 34* 31* 29* 29* 30* 35*  CREATININE 5.33* 5.11* 4.94* 4.65* 4.90* 4.85* 5.02*  CALCIUM  7.8* 7.6* 8.0* 7.6* 7.7* 7.5* 8.1*  PHOS 3.9 4.4 4.7* 4.0 4.5 4.9* 5.0*     Recent Labs Lab 11/17/13 1400  11/22/13 0412 11/23/13 0416 11/24/13 0535  AST 70*  --   --   --   --   ALT 50  --   --   --   --   ALKPHOS 64  --   --   --   --   BILITOT 0.4  --   --   --   --   PROT 5.5*  --   --   --   --   ALBUMIN 1.4*  < > 1.3* 1.2* 1.3*  < > = values in this interval not displayed.  Recent Labs Lab 11/21/13 0602 11/22/13 0412 11/23/13 0416 11/24/13 0535  WBC 7.1 9.0 9.7 9.3  HGB 9.3* 9.3* 9.3* 10.0*  HCT 26.8* 27.9* 27.2* 30.4*  MCV 77.2* 79.7 77.3* 78.6  PLT 256 278 278 346    No results found for this basename: CKTOTAL, CKMB, CKMBINDEX, TROPONINI,  in the last 168 hours   Studies/Results: Dg Chest 1 View  11/23/2013   CLINICAL DATA:  Right-sided chest pain.  EXAM: CHEST - 1 VIEW  COMPARISON:  11/19/2013.  FINDINGS: Mediastinum and hilar structures are normal. Cardiomegaly with mild pulmonary venous congestion. Mild interstitial prominence. Findings consistent mild CHF. No pleural effusion pneumothorax. No acute osseus abnormality.  IMPRESSION: Findings consistent with mild congestive heart failure.   Electronically Signed   By: Marcello Moores  Register   On: 11/23/2013 12:35   Nm Pulmonary Perf And Vent  11/23/2013   CLINICAL DATA:  Pleuritic chest pain, DVT, question pulmonary embolism  EXAM: NUCLEAR MEDICINE VENTILATION - PERFUSION LUNG SCAN  TECHNIQUE: Ventilation images were obtained in multiple projections using inhaled aerosol technetium 99 M DTPA. Perfusion images were obtained in multiple projections after intravenous injection of Tc-73m MAA.  RADIOPHARMACEUTICALS:  40 mCi Tc-50m DTPA aerosol inhalation and 6 mCi Tc-48m MAA IV  COMPARISON:  None ; correlation chest radiograph 11/23/2013  FINDINGS: Ventilation: Mild central airway deposition of aerosol. Swallowed aerosol within stomach. Small subsegmental ventilatory defects identified at the lower lobes.  Perfusion: Normal  perfusion images.  Chest radiograph significant for enlargement of cardiac silhouette and question minimal failure.  IMPRESSION: Normal perfusion lung scan.   Electronically Signed   By: Lavonia Dana M.D.   On: 11/23/2013 12:58      . azithromycin  1,200 mg Oral Weekly  . clindamycin  300 mg Oral 4 times per day  . cyclobenzaprine  10 mg Oral Once  . docusate sodium  100 mg Oral BID  . enoxaparin (LOVENOX) injection  1 mg/kg Subcutaneous Q24H  . furosemide  40 mg Oral BID  . hydrocortisone   Rectal BID  . nystatin  5 mL Oral QID  . pantoprazole  40 mg Oral Q1200  . primaquine  30 mg Oral Daily  . sodium bicarbonate  1,300 mg Oral BID  . valGANciclovir  450 mg Oral QODAY  . warfarin  7.5 mg Oral ONCE-1800  . Warfarin - Pharmacist Dosing Inpatient   Does not apply q1800     I  have reviewed scheduled and prn medications.  ASSESSMENT/RECOMMENDATIONS   BACKGROUND: Jim Wyatt is 33 yo man with pmh of asthma and newly diagnosed HIV/AIDS (CD4 40, VL 67K on 11/10/13) admitted on 9/11 for HCAP who we are consulted for in regards to AKI with a creatinine of 0.86 that  increased to 2.6 in the setting of IV contrast, IV/po bactrim, vancomycin and pre-admission/admission NSAIDS. 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 Etiology most likely ATN from exogenous toxin exposure (contast with concomitant NSAID use) in setting of collapsing form of FSGS. Pt had IR guided diagnostic percutaneous renal biopsy on 9/21 indicating unfortunately collapsing form of FSGS and ATN. Pt to start on HAART per ID. Continue to monitor renal function panel daily, daily weights, strict I/O's, and avoidance of nephrotoxins. No indication  for HD at this time. Continue PO lasix 40 mg BID (pt with mild pulmonary edema on recent CXR) and low sodium diet. Pt to have outpatient nephrology follow-up with Dr. [name] (already scheduled).   Bilateral Acute Distal LE DVT - Pt with bilateral   On 9/26.  In setting of hypercoagulable state of nephrotic syndrome and refusal of DVT prophylaxis. Currently on lovenox and coumadin.  Newly Diagnosed HIV 1 Infection/AIDS - Most  likely since at least 2012 (lympocytopenia on 07/30/10 CBC). CD4 40 with viral load 98K. ID following. Plan to start HAART once genotype known (possibly abacavir based regimen) and renal function improves. Pt on weekly 1200 mg PO azithromycin for MAC prophylaxis. Probable PCP Pneumonia -  Continues to have hypoxia and fevers.  LDH elevated at 830, worsened from 541, CD4 <200, and patchy inflitrates concerning for PCP PNA. Currently on clindamycin and primaquine. ID following. Pt with PaO2 of 56 on admission (9/11) with persistent hypoxia (SpO2 91%) during hospitalization requiring oxygen supplementation. Corticosteroid administration with CMV co-infection shown to have poorer prognosis.    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 ID following. On valganciclovir 450 mg QOD (renally dosed). Pt needs outpatient vision examination.  Hypotonic Euvolemic Hyponatremia - Na 130, 135 on admission (9/11). Chronic since >48 hr duration. No significant response with IV fluids. Urine sodium >20 and osmolarity >100 in setting on acute renal failure. Continue 1.5 L fluid restriction.   Hyperphosphatemia -Phos 5 today in setting of acute renal failure. Continue to monitor. Metabolic Acidosis -  Resolved. S/p IV bicarbonate administration on 9/19. Continue PO sodium bicarbonate 650 mg TID and on discharge.  Acute Microcytic Anemia - Hg stable at 10 today, however below baseline 14 (on admission 12.2). Hemolytic labs did not suggest hemolytic anemia and pt is G6PD negative. Obtain FOBT due to recent dark stools  (pt reported to have external non-bleeding hemorrhoids). Pt with hematuria on recent UA on 11/16/13.  Acute Thrombocytopenia - Resolved. Most likely due to recent bactrim use in setting of chronic liver disease. TTP unlikely given no schistocytes on blood smear  Chronically elevated AST/ALT -  Elevated since July 2015. Hepatitis panel negative. No reported alcohol use. Normal appearing liver on CT abdomen. 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 US abdomen on 9/19 with small amount of sludge within gallbladder.  Probable OSA - Pt needs outpatient sleep study.   Juluis Mire, MD PGY-II IMTS Pager [phone] 11/24/2013 7:34 AM  Attending note to follow

## 2013-11-24 NOTE — Progress Notes (Signed)
I have seen and examined this patient and agree with the plan of care  [facility] W 11/24/2013, 10:55 AM

## 2013-11-25 DIAGNOSIS — I82409 Acute embolism and thrombosis of unspecified deep veins of unspecified lower extremity: Secondary | ICD-10-CM

## 2013-11-25 DIAGNOSIS — E43 Unspecified severe protein-calorie malnutrition: Secondary | ICD-10-CM

## 2013-11-25 LAB — RENAL FUNCTION PANEL
Albumin: 1.2 g/dL — ABNORMAL LOW (ref 3.5–5.2)
Anion gap: 12 (ref 5–15)
BUN: 37 mg/dL — ABNORMAL HIGH (ref 6–23)
CO2: 24 mEq/L (ref 19–32)
Calcium: 8 mg/dL — ABNORMAL LOW (ref 8.4–10.5)
Chloride: 96 mEq/L (ref 96–112)
Creatinine, Ser: 5.11 mg/dL — ABNORMAL HIGH (ref 0.50–1.35)
GFR calc Af Amer: 16 mL/min — ABNORMAL LOW (ref >90)
GFR calc non Af Amer: 14 mL/min — ABNORMAL LOW (ref >90)
Glucose, Bld: 88 mg/dL (ref 70–99)
Phosphorus: 5.8 mg/dL — ABNORMAL HIGH (ref 2.3–4.6)
Potassium: 4 mEq/L (ref 3.7–5.3)
Sodium: 132 mEq/L — ABNORMAL LOW (ref 137–147)

## 2013-11-25 LAB — PROTIME-INR
INR: 1.6 — ABNORMAL HIGH (ref 0.00–1.49)
Prothrombin Time: 19.1 seconds — ABNORMAL HIGH (ref 11.6–15.2)

## 2013-11-25 MED ORDER — PANTOPRAZOLE SODIUM 40 MG PO TBEC: 40.0000 mg | DELAYED_RELEASE_TABLET | Freq: Every day | ORAL | Status: DC

## 2013-11-25 MED ORDER — FUROSEMIDE 40 MG PO TABS: 40.0000 mg | ORAL_TABLET | Freq: Two times a day (BID) | ORAL | Status: DC

## 2013-11-25 MED ORDER — RITONAVIR 100 MG PO TABS: 100.0000 mg | ORAL_TABLET | Freq: Every day | ORAL | Status: DC

## 2013-11-25 MED ORDER — WARFARIN SODIUM 5 MG PO TABS: 5.0000 mg | ORAL_TABLET | Freq: Once | ORAL | Status: DC

## 2013-11-25 MED ORDER — UNABLE TO FIND: Status: DC

## 2013-11-25 MED ORDER — DARUNAVIR ETHANOLATE 800 MG PO TABS: 800.0000 mg | ORAL_TABLET | Freq: Every day | ORAL | Status: DC

## 2013-11-25 MED ORDER — POLYETHYLENE GLYCOL 3350 17 G PO PACK: 17.0000 g | PACK | Freq: Every day | ORAL | Status: DC | PRN

## 2013-11-25 MED ORDER — VALGANCICLOVIR HCL 450 MG PO TABS: 450.0000 mg | ORAL_TABLET | ORAL | Status: DC

## 2013-11-25 MED ORDER — PRIMAQUINE PHOSPHATE 26.3 MG PO TABS: 30.0000 mg | ORAL_TABLET | Freq: Every day | ORAL | Status: DC

## 2013-11-25 MED ORDER — ABACAVIR SULFATE 300 MG PO TABS: 600.0000 mg | ORAL_TABLET | Freq: Every day | ORAL | Status: DC

## 2013-11-25 MED ORDER — HYDROCODONE-ACETAMINOPHEN 5-325 MG PO TABS: 1.0000 | ORAL_TABLET | ORAL | Status: DC | PRN

## 2013-11-25 MED ORDER — CLINDAMYCIN HCL 300 MG PO CAPS: 300.0000 mg | ORAL_CAPSULE | Freq: Four times a day (QID) | ORAL | Status: DC

## 2013-11-25 MED ORDER — ENOXAPARIN SODIUM 100 MG/ML ~~LOC~~ SOLN
90.0000 mg | SUBCUTANEOUS | Status: DC
  Administered 2013-11-25: 90 mg via SUBCUTANEOUS
  Filled 2013-11-25: qty 1

## 2013-11-25 MED ORDER — ENOXAPARIN SODIUM 100 MG/ML ~~LOC~~ SOLN: 90.0000 mg | SUBCUTANEOUS | Status: DC

## 2013-11-25 MED ORDER — WARFARIN SODIUM 5 MG PO TABS
5.0000 mg | ORAL_TABLET | Freq: Once | ORAL | Status: AC
  Administered 2013-11-25: 5 mg via ORAL
  Filled 2013-11-25: qty 1

## 2013-11-25 MED ORDER — AZITHROMYCIN 600 MG PO TABS: 1200.0000 mg | ORAL_TABLET | ORAL | Status: DC

## 2013-11-25 MED ORDER — LAMIVUDINE 10 MG/ML PO SOLN: 100.0000 mg | Freq: Every day | ORAL | Status: DC

## 2013-11-25 NOTE — Progress Notes (Signed)
INFECTIOUS DISEASE PROGRESS NOTE  ID: Jim Wyatt is a 33 y.o. male with  Principal Problem:   Pneumonia Active Problems:   Asthma   Elevated liver enzymes   History of shingles   Obesity   AKI (acute kidney injury)   Human immunodeficiency virus (HIV) disease   DVT (deep venous thrombosis)   Asthma, chronic  Subjective: Feels better  Abtx:  Anti-infectives   Start     Dose/Rate Route Frequency Ordered Stop   11/25/13 1200  Darunavir Ethanolate (PREZISTA) tablet 800 mg  Status:  Discontinued     800 mg Oral Daily with breakfast 11/24/13 1046 11/24/13 1105   11/24/13 1200  ritonavir (NORVIR) tablet 100 mg     100 mg Oral Daily with breakfast 11/24/13 1046     11/24/13 1200  abacavir (ZIAGEN) tablet 600 mg     600 mg Oral Daily 11/24/13 1046     11/24/13 1200  lamiVUDine (EPIVIR) 10 MG/ML solution 100 mg     100 mg Oral Daily 11/24/13 1046     11/24/13 1200  Darunavir Ethanolate (PREZISTA) tablet 800 mg     800 mg Oral Daily with breakfast 11/24/13 1105     11/20/13 1100  valGANciclovir (VALCYTE) 450 MG tablet TABS 450 mg     450 mg Oral Every other day 11/20/13 1007     11/13/13 1100  azithromycin (ZITHROMAX) tablet 1,200 mg     1,200 mg Oral Weekly 11/13/13 0950     11/13/13 0600  levofloxacin (LEVAQUIN) tablet 750 mg  Status:  Discontinued     750 mg Oral Every 48 hours 11/11/13 1306 11/14/13 1050   11/12/13 1800  clindamycin (CLEOCIN) capsule 300 mg     300 mg Oral 4 times per day 11/12/13 1458 11/29/13 1759   11/12/13 1600  primaquine tablet 30 mg     30 mg Oral Daily 11/12/13 1458 11/29/13 0959   11/11/13 1600  sulfamethoxazole-trimethoprim (BACTRIM DS) 800-160 MG per tablet 2 tablet  Status:  Discontinued     2 tablet Oral 3 times daily 11/11/13 1011 11/12/13 1458   11/11/13 0500  levofloxacin (LEVAQUIN) tablet 750 mg  Status:  Discontinued     750 mg Oral Every 24 hours 11/10/13 1042 11/11/13 1306   11/10/13 1200  sulfamethoxazole-trimethoprim (BACTRIM  DS) 800-160 MG per tablet 2 tablet  Status:  Discontinued     2 tablet Oral 4 times daily 11/10/13 1100 11/11/13 1011   11/10/13 0200  sulfamethoxazole-trimethoprim (BACTRIM) 340 mg of trimethoprim in dextrose 5 % 500 mL IVPB  Status:  Discontinued     340 mg of trimethoprim 347.5 mL/hr over 90 Minutes Intravenous Every 6 hours 11/09/13 2056 11/10/13 1100   11/09/13 1800  sulfamethoxazole-trimethoprim (BACTRIM) 340 mg of trimethoprim in dextrose 5 % 500 mL IVPB  Status:  Discontinued     340 mg of trimethoprim 347.5 mL/hr over 90 Minutes Intravenous 4 times per day 11/09/13 1637 11/09/13 2056   11/07/13 0800  vancomycin (VANCOCIN) IVPB 1000 mg/200 mL premix  Status:  Discontinued     1,000 mg 200 mL/hr over 60 Minutes Intravenous Every 8 hours 11/07/13 0542 11/09/13 1603   11/07/13 0600  levofloxacin (LEVAQUIN) IVPB 750 mg  Status:  Discontinued     750 mg 100 mL/hr over 90 Minutes Intravenous Every 24 hours 11/07/13 0539 11/10/13 1042   11/07/13 0545  vancomycin (VANCOCIN) IVPB 1000 mg/200 mL premix  Status:  Discontinued     1,000 mg  200 mL/hr over 60 Minutes Intravenous  Once 11/07/13 0539 11/07/13 0557   11/07/13 0030  levofloxacin (LEVAQUIN) IVPB 750 mg     750 mg 100 mL/hr over 90 Minutes Intravenous  Once 11/07/13 0029 11/07/13 0235   11/07/13 0030  vancomycin (VANCOCIN) IVPB 1000 mg/200 mL premix     1,000 mg 200 mL/hr over 60 Minutes Intravenous  Once 11/07/13 0029 11/07/13 0405      Medications:  Scheduled: . abacavir  600 mg Oral Daily  . azithromycin  1,200 mg Oral Weekly  . clindamycin  300 mg Oral 4 times per day  . darunavir  800 mg Oral Q breakfast  . docusate sodium  100 mg Oral BID  . enoxaparin (LOVENOX) injection  90 mg Subcutaneous Q24H  . furosemide  40 mg Oral BID  . hydrocortisone   Rectal BID  . lamiVUDine  100 mg Oral Daily  . nystatin  5 mL Oral QID  . pantoprazole  40 mg Oral Q1200  . primaquine  30 mg Oral Daily  . ritonavir  100 mg Oral Q  breakfast  . sodium bicarbonate  1,300 mg Oral BID  . valGANciclovir  450 mg Oral QODAY  . warfarin  5 mg Oral ONCE-1800  . Warfarin - Pharmacist Dosing Inpatient   Does not apply q1800    Objective: Vital signs in last 24 hours: Temp:  [98.3 F (36.8 C)-98.7 F (37.1 C)] 98.5 F (36.9 C) (09/29 0500) Pulse Rate:  [100-115] 100 (09/29 0500) Resp:  [19-20] 19 (09/29 0500) BP: (130-140)/(81-84) 132/84 mmHg (09/29 0500) SpO2:  [94 %-95 %] 94 % (09/29 0500) Weight:  [91.6 kg (201 lb 15.1 oz)] 91.6 kg (201 lb 15.1 oz) (09/29 0500)   General appearance: alert, cooperative and no distress Resp: clear to auscultation bilaterally Cardio: regular rate and rhythm GI: normal findings: bowel sounds normal and soft, non-tender  Lab Results  Recent Labs  11/23/13 0416 11/24/13 0535 11/25/13 0456  WBC 9.7 9.3  --   HGB 9.3* 10.0*  --   HCT 27.2* 30.4*  --   NA 131* 130* 132*  K 4.3 4.1 4.0  CL 98 95* 96  CO2 _0 BUN 30* 35* 37*  CREATININE 4.85* 5.02* 5.11*   Liver Panel  Recent Labs  11/24/13 0535 11/25/13 0456  ALBUMIN 1.3* 1.2*   Sedimentation Rate No results found for this basename: ESRSEDRATE,  in the last 72 hours C-Reactive Protein No results found for this basename: CRP,  in the last 72 hours  Microbiology: Recent Results (from the past 240 hour(s))  AFB CULTURE, BLOOD     Status: None   Collection Time    11/15/13  8:40 PM      Result Value Ref Range Status   Specimen Description BLOOD RIGHT ARM   Final   Special Requests 5CC AFB   Final   Culture     Final   Value: CULTURE WILL BE EXAMINED FOR 6 WEEKS BEFORE ISSUING A FINAL REPORT     Performed at [facility]   Report Status PENDING   Incomplete  CULTURE, BLOOD (SINGLE)     Status: None   Collection Time    11/22/13  9:30 PM      Result Value Ref Range Status   Specimen Description BLOOD RIGHT HAND   Final   Special Requests BOTTLES DRAWN AEROBIC AND ANAEROBIC 5CC EACH   Final    Culture  Setup Time  Final   Value: 11/23/2013 03:31     Performed at [facility]   Culture     Final   Value:        BLOOD CULTURE RECEIVED NO GROWTH TO DATE CULTURE WILL BE HELD FOR 5 DAYS BEFORE ISSUING A FINAL NEGATIVE REPORT     Performed at [facility]   Report Status PENDING   Incomplete  CULTURE, BLOOD (SINGLE)     Status: None   Collection Time    11/23/13  9:50 AM      Result Value Ref Range Status   Specimen Description BLOOD RIGHT ARM   Final   Special Requests BOTTLES DRAWN AEROBIC AND ANAEROBIC 10CC   Final   Culture  Setup Time     Final   Value: 11/23/2013 16:40     Performed at [facility]   Culture     Final   Value:        BLOOD CULTURE RECEIVED NO GROWTH TO DATE CULTURE WILL BE HELD FOR 5 DAYS BEFORE ISSUING A FINAL NEGATIVE REPORT     Performed at [facility]   Report Status PENDING   Incomplete    Studies/Results: Dg Chest 1 View  11/23/2013   CLINICAL DATA:  Right-sided chest pain.  EXAM: CHEST - 1 VIEW  COMPARISON:  11/19/2013.  FINDINGS: Mediastinum and hilar structures are normal. Cardiomegaly with mild pulmonary venous congestion. Mild interstitial prominence. Findings consistent mild CHF. No pleural effusion pneumothorax. No acute osseus abnormality.  IMPRESSION: Findings consistent with mild congestive heart failure.   Electronically Signed   By: Marcello Moores  Register   On: 11/23/2013 12:35   Nm Pulmonary Perf And Vent  11/23/2013   CLINICAL DATA:  Pleuritic chest pain, DVT, question pulmonary embolism  EXAM: NUCLEAR MEDICINE VENTILATION - PERFUSION LUNG SCAN  TECHNIQUE: Ventilation images were obtained in multiple projections using inhaled aerosol technetium 99 M DTPA. Perfusion images were obtained in multiple projections after intravenous injection of Tc-33mMAA.  RADIOPHARMACEUTICALS:  40 mCi Tc-979mTPA aerosol inhalation and 6 mCi Tc-9955mA IV  COMPARISON:  None ; correlation chest radiograph 11/23/2013  FINDINGS:  Ventilation: Mild central airway deposition of aerosol. Swallowed aerosol within stomach. Small subsegmental ventilatory defects identified at the lower lobes.  Perfusion: Normal perfusion images.  Chest radiograph significant for enlargement of cardiac silhouette and question minimal failure.  IMPRESSION: Normal perfusion lung scan.   Electronically Signed   By: MarLavonia DanaD.   On: 11/23/2013 12:58     Assessment/Plan: AIDS (genotype naive, HLA -)  Pneumonia, ? PCP  ARF- Bx FSGS, HIVAN  Bilateral DVT 11-22-13  CMV PCR 89,011  Hemorrhoids  Severe protein-alb malnutrition   Total days of antibiotics:  9/24 Valcyte  9/16 clinda/primaquine  9/11 levaquin 9/18  9/13 bactrim 9/16   f/u appt 12-09-13 at 3:00 with Dr JefDellis Filbert HatJohnnye Simall try to get him emergency ADAP          [facility] (pager) [phone] 11/25/2013, 10:29 AM  LOS: 18 days

## 2013-11-25 NOTE — Discharge Summary (Signed)
Physician Discharge Summary  Jim Wyatt Xxxscotton [name] DOB: Feb 21, 1981 DOA: 11/07/2013  PCP: No PCP Per Patient  Admit date: 11/07/2013 Discharge date: 11/25/2013  Time spent: 45 minutes  Recommendations for Outpatient Follow-up:  1. [facility], INR check 10/1 and stop lovenox if INR >2 2. Dr.Sanford, Renal 10/9 at 8:15am 3. 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 male with a past medical history of asthma, and a recently treated community-acquired pneumonia, who was admitted with chest pain, shortness of breath and abdominal pain  Hospital Course:  He was admitted and was found to have HIV, and HCAP/presumed PCP pneumonia  ID consulted for his new HIV status. On admission his creatinine was less than 1. He developed acute renal failure and his creatinine was 2.55 on 9/15. His US renal is negative for hydronephrosis. His creatinine went up to 5.33 over the last one week despite hydration. He continues to have good urine output. Renal consulted and he underwent renal biopsy on 9/21.  Biopsy c/w HIVAN and ATN, creatinine plateaued, then had issues with large painful thrombosed hemorroids which didn't improve with conservative Rx and then seen by [facility] and underwent incision and evacuation of clot.  Then 9/26 started complaining of Calf pain and  tenderness, found to have DVTs, had been refusing heparin most of hospitalization and also had to be held due to hematuria and Renal biopsy  Community acquired pneumonia/probable PCP pneumonia  -CT chest and abdomen reveals left pulmonary infiltrates.  -blood culture x 2 NGTD  -urine legionella negative, S. pneumococcal antigen negative  -Continue clindamycin and primaquine per ID, for 21days total, needs 5 more days -repeat CXR-improved, weaned off O2.   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 due to bactrim, vancomycin, contrast and nsaids  -off nephrotoxic agents  -Renal following, urine output good, but creatinine stable and plateaued around 5 -S/p renal biopsy on 9/21  -creatinine plateaued, started HAART,  -now on Po lasix, FU with Dr.Sanford 10/9, may need to start HD down the road if creatinine doesn't improve  HIV/AIDS  -newly diagnosed  -Followed by ID  -started on HAART due to HIVAN  -FU with ID, per dr.Hatcher emergency ADAP application started  Hemorrhoids/ extremely tender  -thrombosed, s/p evacuation of hematoma per [facility] 9/25  -continue anusol, hydrocortisone cream, stool softeners and sitz bath, pain improving   DVt and suspected PE  -pleuritic chest pain overnight 9/26, VQ scan normal  -was refusing Heparin SQ early on and hence was started on SCDs per Dr.Akula last week  -also Hep SQ had to be stopped due to some hematuria/hemorroids and for renal biopsy  -started on Anticoagulation with Lovenox/Coumadin -continue Lovenox bridge Day 3, started Coumadin 9/27, INR at [facility] 1.6 today -Next INR check on 10/1, stop lovenox  if INR >2 -not candidate for NOACs due to AKI   Asthma: stable.  -Albuterol inhaler PRN   CMV positivity/Fever:  -continues to be on Abx for probable PCP Pneumonia per ID  -CMV titres positive, started Valcyte 9/24  -Blood Cx drawn 9/26 negative -improved and stable  Chronically elevated AST/ALT  - Elevated since July 2015. Hepatitis panel  negative. No reported alcohol use. Normal appearing liver on CT abdomen. 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 US abdomen on 9/19 with small amount of sludge within gallbladder   Consultations:  ID  Renal  Discharge Exam: Filed Vitals:   11/25/13 1251  BP: 135/71  Pulse: 107  Temp:   Resp: 18    General: AAOx3 Cardiovascular: S1S2/RRR Respiratory: CTAB  Discharge Instructions You were cared for by a hospitalist during your hospital stay. If you have any questions about your discharge medications or the care you received while you were in the hospital after you are discharged, you can call the unit and asked to speak with the hospitalist on call if the hospitalist that took care of you is not available. Once you are discharged, your primary care physician will handle any further medical issues. Please note that NO REFILLS for any discharge medications will be authorized once you are discharged, as it is imperative that you return to your primary care physician (or establish a relationship with a primary care physician if you do not have one) for your aftercare needs so that they can reassess your need for medications and monitor your lab values.  Discharge Instructions   Discharge instructions    Complete by:  As directed   Renal low sodium diet     Increase activity slowly    Complete by:  As directed           Current Discharge Medication List    START taking these medications   Details  abacavir (ZIAGEN) 300 MG tablet Take 2 tablets (600 mg total) by mouth daily. Qty: 60 tablet, Refills: 0    azithromycin (ZITHROMAX) 600 MG tablet Take 2 tablets (1,200 mg total) by mouth once a week. Qty: 16 tablet, Refills: 0    clindamycin (CLEOCIN) 300 MG capsule Take 1 capsule (300 mg total) by mouth every 6 (six) hours. For 5 days Qty: 20 capsule, Refills: 0    Darunavir Ethanolate (PREZISTA) 800 MG tablet Take 1 tablet (800 mg total) by mouth daily with  breakfast. Qty: 30 tablet, Refills: 0    enoxaparin (LOVENOX) 100 MG/ML injection Inject 0.9 mLs (90 mg total) into the skin daily. Qty: 2 Syringe, Refills: 0    furosemide (LASIX) 40 MG tablet Take 1 tablet (40 mg total) by mouth 2 (two) times daily. Qty: 60 tablet, Refills: 0    HYDROcodone-acetaminophen (NORCO/VICODIN) 5-325 MG per tablet Take 1-2 tablets by mouth every 4 (four) hours as needed for moderate pain. Qty: 30 tablet, Refills: 0    lamiVUDine (EPIVIR) 10 MG/ML solution Take 10 mLs (100 mg total) by mouth daily. Qty: 240 mL, Refills: 0    pantoprazole (PROTONIX) 40 MG tablet Take 1 tablet (40 mg total) by mouth daily at 12 noon. Qty: 30 tablet, Refills: 0    polyethylene glycol (MIRALAX / GLYCOLAX) packet Take 17 g by mouth daily as needed for moderate constipation. Qty: 14 each, Refills: 0    primaquine 26.3 MG tablet Take 2 tablets (30 mg total) by mouth daily. For 5 days Qty: 10 tablet, Refills: 0  ritonavir (NORVIR) 100 MG TABS tablet Take 1 tablet (100 mg total) by mouth daily with breakfast. Qty: 30 tablet, Refills: 0    valGANciclovir (VALCYTE) 450 MG tablet Take 1 tablet (450 mg total) by mouth every other day. Qty: 15 tablet, Refills: 0    warfarin (COUMADIN) 5 MG tablet Take 1 tablet (5 mg total) by mouth one time only at 6 PM. Take 9/29 and 9/30 and then based on INR check 10/1 Qty: 30 tablet, Refills: 0      CONTINUE these medications which have NOT CHANGED   Details  albuterol (PROVENTIL HFA;VENTOLIN HFA) 108 (90 BASE) MCG/ACT inhaler Inhale 2 puffs into the lungs every 6 (six) hours as needed for wheezing or shortness of breath.      STOP taking these medications     doxycycline (VIBRA-TABS) 100 MG tablet      ibuprofen (ADVIL,MOTRIN) 200 MG tablet        Allergies  Allergen Reactions  . Augmentin [Amoxicillin-Pot Clavulanate] Other (See Comments)    unknown  . Bactrim [Sulfamethoxazole-Tmp Ds]     Renal failure  . Banana   .  Cauliflower [Brassica Oleracea Italica] Other (See Comments)    Pt states his throat closes up  . Other     Patient has multiple food allergies   Follow-up Information   Follow up with Rexene Agent, MD On 12/05/2013. (8:15am)    Specialty:  Nephrology   Contact information:   [place] [address] [phone]       Follow up with [facility]     On 11/27/2013. (11:15  Dr. Doreene Burke    will also check PT/INR at this appt.  Please bring photo ID and all meds you are currently taking. )    Contact information:   [place] [id number] [phone]      Follow up with Bobby Rumpf, MD On 12/09/2013.   Specialty:  Infectious Diseases   Contact information:   [address] [place] [address] [phone]        The results of significant diagnostics from this hospitalization (including imaging, microbiology, ancillary and laboratory) are listed below for reference.    Significant Diagnostic Studies: Dg Chest 1 View  11/23/2013   CLINICAL DATA:  Right-sided chest pain.  EXAM: CHEST - 1 VIEW  COMPARISON:  11/19/2013.  FINDINGS: Mediastinum and hilar structures are normal. Cardiomegaly with mild pulmonary venous congestion. Mild interstitial prominence. Findings consistent mild CHF. No pleural effusion pneumothorax. No acute osseus abnormality.  IMPRESSION: Findings consistent with mild congestive heart failure.   Electronically Signed   By: Marcello Moores  Register   On: 11/23/2013 12:35   Dg Chest 2 View  11/19/2013   CLINICAL DATA:  Followup hypoxia.  EXAM: CHEST  2 VIEW  COMPARISON:  11/14/2013.  FINDINGS: Improved perihilar opacities, not yet quite clear consistent with resolving pneumonia. No focal consolidation. Small RIGHT pleural effusion persists. Unchanged cardiomediastinal silhouette.  IMPRESSION: Improved aeration.   Electronically Signed   By: Rolla Flatten M.D.   On: 11/19/2013 12:43   Dg Chest 2  View  11/14/2013   CLINICAL DATA:  Fever, pneumonia  EXAM: CHEST  2 VIEW  COMPARISON:  11/07/2012  FINDINGS: Cardiomediastinal silhouette is stable. Persistent mild perihilar bronchitic changes. Again noted patchy infiltrate in the left perihilar and left infrahilar. Follow-up to complete resolution is recommended. No pulmonary edema.  IMPRESSION: Persistent perihilar bronchitic changes and left  perihilar and infrahilar infiltrate/ pneumonia. Follow-up to resolution is recommended. No new infiltrate. No pulmonary edema.   Electronically Signed   By: Lahoma Crocker M.D.   On: 11/14/2013 11:57   Dg Chest 2 View  11/07/2013   CLINICAL DATA:  Chest pain, recent diagnosis of pneumonia.  EXAM: CHEST  2 VIEW  COMPARISON:  Chest radiograph November 02, 2013  FINDINGS: Cardiomediastinal silhouette is unremarkable and unchanged. Mild perihilar peribronchial cuffing with slightly increasing patchy airspace opacities left mid and lower lung zone. No pleural effusions. No pneumothorax. Soft tissue planes and included osseous structures nonsuspicious peer  IMPRESSION: Perihilar peribronchial cuffing and slightly worsening left lung patchy consolidation concerning for bronchopneumonia.   Electronically Signed   By: Elon Alas   On: 11/07/2013 01:42   Dg Chest 2 View  11/02/2013   CLINICAL DATA:  Chest pain, cough for 4 days.  EXAM: CHEST  2 VIEW  COMPARISON:  Chest radiograph July 30, 2010  FINDINGS: Patchy airspace opacities in left lung. No pleural effusions. Mild prominence of the central bronchovascular markings. No pneumothorax Cardiomediastinal silhouette is unremarkable. Soft tissue planes and included osseous structures are nonsuspicious.  IMPRESSION: Patchy left lung airspace opacities concerning for pneumonia. Considering mild interstitial prominence this could reflect bronchopneumonia.   Electronically Signed   By: Elon Alas   On: 11/02/2013 00:25   Ct Angio Chest Pe W/cm &/or Wo Cm  11/07/2013    CLINICAL DATA:  Mid chest pain and shortness of breath with dry cough for several days. Diagnosed with pneumonia 5 days ago. Chest pain, hypoxia, tachycardia.  EXAM: CT ANGIOGRAPHY CHEST  CT ABDOMEN AND PELVIS WITH CONTRAST  TECHNIQUE: Multidetector CT imaging of the chest was performed using the standard protocol during bolus administration of intravenous contrast. Multiplanar CT image reconstructions and MIPs were obtained to evaluate the vascular anatomy. Multidetector CT imaging of the abdomen and pelvis was performed using the standard protocol during bolus administration of intravenous contrast.  CONTRAST:  111mL OMNIPAQUE IOHEXOL 350 MG/ML SOLN  COMPARISON:  CT abdomen and pelvis 09/03/2013.  Chest 11/07/2013.  FINDINGS: CTA CHEST FINDINGS  Technically adequate study with moderately good opacification of the central and segmental pulmonary arteries. No focal filling defects are demonstrated. No evidence of significant pulmonary embolus.  Normal heart size. Normal caliber thoracic aorta. Great vessel origins are patent. Bilateral hilar and mediastinal lymph nodes are present without significant pathologic enlargement, likely to be reactive. Small esophageal hiatal hernia. Esophagus is decompressed.  Patchy airspace disease demonstrated in the left lung base consistent with left lower lobe pneumonia. Additional patchy infiltrative changes throughout the right lung as well. Airways appear patent. No pleural effusions. No pneumothorax.  The with  CT ABDOMEN and PELVIS FINDINGS  The liver, spleen, gallbladder, pancreas, adrenal glands, kidneys, abdominal aorta, inferior vena cava, and retroperitoneal lymph nodes are unremarkable. Stomach and small bowel are not abnormally distended. Gas and stool in the colon without distention. No free air or free fluid in the abdomen.  Pelvis: A 2 mm appendicolith is demonstrated. The appendix is not abnormally distended and no inflammatory changes are present. Unlikely to  represent acute appendicitis. Bladder is mostly decompressed. Prostate gland is not enlarged. No free or loculated pelvic fluid collections. No pelvic mass or lymphadenopathy. No destructive bone lesions.  Review of the MIP images confirms the above findings.  IMPRESSION: No evidence of significant pulmonary embolus. Patchy bilateral airspace disease in the lungs, concentrated in the left lower lung, consistent with pneumonia.  Appendicolith with without evidence of appendicitis. No acute changes otherwise demonstrated in the abdomen or pelvis.   Electronically Signed   By: Lucienne Capers M.D.   On: 11/07/2013 03:46   Nm Hepatobiliary  11/17/2013   CLINICAL DATA:  Abdominal pain, nausea, vomiting.  EXAM: NUCLEAR MEDICINE HEPATOBILIARY IMAGING  TECHNIQUE: Sequential images of the abdomen were obtained out to 60 minutes following intravenous administration of radiopharmaceutical.  RADIOPHARMACEUTICALS:  5.0 Mill[place]urie 123XX123 Choletec  COMPARISON:  Ultrasound 11/15/2012  FINDINGS: There is prompt uptake and excretion of radiotracer by the liver. Slight filling of the gallbladder early with the late further filling of the gallbladder. No evidence of cystic duct or common bile duct obstruction.  IMPRESSION: No evidence of biliary obstruction. There is slight filling of the gallbladder early with delayed further filling. This could reflect mild chronic gallbladder disease.   Electronically Signed   By: Rolm Baptise M.D.   On: 11/17/2013 09:27   US Abdomen Complete  11/15/2013   CLINICAL DATA:  Abdominal pain.  EXAM: ULTRASOUND ABDOMEN COMPLETE  COMPARISON:  CT of the abdomen and pelvis performed 11/07/2013, and renal ultrasound performed 11/11/2013  FINDINGS: Gallbladder:  A small amount of echogenic sludge is noted within the gallbladder. The gallbladder is otherwise unremarkable. No gallbladder wall thickening or pericholecystic fluid is seen. No or sonographic Murphy's sign is elicited.  Common bile duct:   Diameter: 0.5 cm, within normal limits in caliber.  Liver:  No focal lesion identified. Within normal limits in parenchymal echogenicity.  IVC:  No abnormality visualized.  Pancreas:  Visualized portion unremarkable.  Spleen:  Size and appearance within normal limits.  Right Kidney:  Length: 13.8 cm. Significantly increased parenchymal echogenicity noted. No mass or hydronephrosis visualized.  Left Kidney:  Length: 15.3 cm. Significantly increased parenchymal echogenicity noted. No mass or hydronephrosis visualized.  Abdominal aorta:  No aneurysm visualized. The mid to distal abdominal aorta is obscured by overlying bowel gas.  Other findings:  None.  IMPRESSION: 1. No acute abnormality seen in the abdomen. 2. Small amount of echogenic sludge noted within the gallbladder; gallbladder otherwise unremarkable in appearance. 3. Significantly increased bilateral renal parenchymal echogenicity raises concern for medical renal disease, as noted on the prior study.   Electronically Signed   By: Garald Balding M.D.   On: 11/15/2013 21:47   Ct Abdomen Pelvis W Contrast  11/07/2013   CLINICAL DATA:  Mid chest pain and shortness of breath with dry cough for several days. Diagnosed with pneumonia 5 days ago. Chest pain, hypoxia, tachycardia.  EXAM: CT ANGIOGRAPHY CHEST  CT ABDOMEN AND PELVIS WITH CONTRAST  TECHNIQUE: Multidetector CT imaging of the chest was performed using the standard protocol during bolus administration of intravenous contrast. Multiplanar CT image reconstructions and MIPs were obtained to evaluate the vascular anatomy. Multidetector CT imaging of the abdomen and pelvis was performed using the standard protocol during bolus administration of intravenous contrast.  CONTRAST:  187mL OMNIPAQUE IOHEXOL 350 MG/ML SOLN  COMPARISON:  CT abdomen and pelvis 09/03/2013.  Chest 11/07/2013.  FINDINGS: CTA CHEST FINDINGS  Technically adequate study with moderately good opacification of the central and segmental  pulmonary arteries. No focal filling defects are demonstrated. No evidence of significant pulmonary embolus.  Normal heart size. Normal caliber thoracic aorta. Great vessel origins are patent. Bilateral hilar and mediastinal lymph nodes are present without significant pathologic enlargement, likely to be reactive. Small esophageal hiatal hernia. Esophagus is decompressed.  Patchy airspace disease demonstrated in the left lung base  consistent with left lower lobe pneumonia. Additional patchy infiltrative changes throughout the right lung as well. Airways appear patent. No pleural effusions. No pneumothorax.  The with  CT ABDOMEN and PELVIS FINDINGS  The liver, spleen, gallbladder, pancreas, adrenal glands, kidneys, abdominal aorta, inferior vena cava, and retroperitoneal lymph nodes are unremarkable. Stomach and small bowel are not abnormally distended. Gas and stool in the colon without distention. No free air or free fluid in the abdomen.  Pelvis: A 2 mm appendicolith is demonstrated. The appendix is not abnormally distended and no inflammatory changes are present. Unlikely to represent acute appendicitis. Bladder is mostly decompressed. Prostate gland is not enlarged. No free or loculated pelvic fluid collections. No pelvic mass or lymphadenopathy. No destructive bone lesions.  Review of the MIP images confirms the above findings.  IMPRESSION: No evidence of significant pulmonary embolus. Patchy bilateral airspace disease in the lungs, concentrated in the left lower lung, consistent with pneumonia.  Appendicolith with without evidence of appendicitis. No acute changes otherwise demonstrated in the abdomen or pelvis.   Electronically Signed   By: Lucienne Capers M.D.   On: 11/07/2013 03:46   US Renal  11/11/2013   CLINICAL DATA:  Acute renal injury, fever  EXAM: RENAL/URINARY TRACT ULTRASOUND COMPLETE  COMPARISON:  None.  FINDINGS: Right Kidney:  Length: 12.1 cm. Diffuse increased echogenicity of the right  kidney is noted.  Left Kidney:  Length: 12.7 cm. Echogenicity within normal limits. No mass or hydronephrosis visualized.  Bladder:  Incompletely distended.  No gross abnormality is noted.  IMPRESSION: Hyperechoic right kidney. This may be related to a component of medical renal disease. No other focal abnormality is noted.   Electronically Signed   By: Inez Catalina M.D.   On: 11/11/2013 10:07   Nm Pulmonary Perf And Vent  11/23/2013   CLINICAL DATA:  Pleuritic chest pain, DVT, question pulmonary embolism  EXAM: NUCLEAR MEDICINE VENTILATION - PERFUSION LUNG SCAN  TECHNIQUE: Ventilation images were obtained in multiple projections using inhaled aerosol technetium 99 M DTPA. Perfusion images were obtained in multiple projections after intravenous injection of Tc-3m MAA.  RADIOPHARMACEUTICALS:  40 mCi Tc-18m DTPA aerosol inhalation and 6 mCi Tc-27m MAA IV  COMPARISON:  None ; correlation chest radiograph 11/23/2013  FINDINGS: Ventilation: Mild central airway deposition of aerosol. Swallowed aerosol within stomach. Small subsegmental ventilatory defects identified at the lower lobes.  Perfusion: Normal perfusion images.  Chest radiograph significant for enlargement of cardiac silhouette and question minimal failure.  IMPRESSION: Normal perfusion lung scan.   Electronically Signed   By: Lavonia Dana M.D.   On: 11/23/2013 12:58   US Biopsy  11/17/2013   CLINICAL DATA:  33 year old male with acute renal failure, new diagnosis of HIV and progressive proteinuria. Random renal biopsy is warranted.  EXAM: ULTRASOUND BIOPSY CORE LIVER  Date: 11/17/2013  PROCEDURE: 1. Ultrasound-guided random biopsy of the lower pole cortex of the right kidney Interventional Radiologist:  Criselda Peaches, MD  ANESTHESIA/SEDATION: Moderate (conscious) sedation was used. Two mg Versed, 50 mcg Fentanyl were administered intravenously. The patient's vital signs were monitored continuously by radiology nursing throughout the procedure.   Sedation Time: 19 minutes  TECHNIQUE: Informed consent was obtained from the patient following explanation of the procedure, risks, benefits and alternatives. The patient understands, agrees and consents for the procedure. All questions were addressed. A time out was performed.  The bilateral flanks were interrogated with ultrasound. The right lower renal pole appears more accessible to percutaneous biopsy. An appropriate  skin entry site was selected and marked. The region was sterilely prepped and draped in the standard fashion using Betadine skin prep.  Local anesthesia was attained by infiltration with 1% lidocaine. A small dermatotomy was made. Under real-time sonographic guidance, a 15 gauge trocar needle was advanced and positioned in the retroperitoneal fat just outside the renal parenchyma. Two 16 gauge core biopsies were then obtained, again under real-time sonographic guidance. Post biopsy imaging demonstrates no perinephric hematoma or evidence of active bleeding.  The biopsy specimens were placed in saline. The patient tolerated the procedure well.  COMPLICATIONS: None immediate  IMPRESSION: Technically successful ultrasound-guided random core biopsy of the lower pole cortex of the right kidney.  Signed,  Criselda Peaches, MD  [facility]  [facility]   Electronically Signed   By: Jacqulynn Cadet M.D.   On: 11/17/2013 15:35    Microbiology: Recent Results (from the past 240 hour(s))  AFB CULTURE, BLOOD     Status: None   Collection Time    11/15/13  8:40 PM      Result Value Ref Range Status   Specimen Description BLOOD RIGHT ARM   Final   Special Requests 5CC AFB   Final   Culture     Final   Value: CULTURE WILL BE EXAMINED FOR 6 WEEKS BEFORE ISSUING A FINAL REPORT     Performed at [facility]   Report Status PENDING   Incomplete  CULTURE, BLOOD (SINGLE)     Status: None   Collection Time    11/22/13  9:30 PM      Result  Value Ref Range Status   Specimen Description BLOOD RIGHT HAND   Final   Special Requests BOTTLES DRAWN AEROBIC AND ANAEROBIC 5CC EACH   Final   Culture  Setup Time     Final   Value: 11/23/2013 03:31     Performed at [facility]   Culture     Final   Value:        BLOOD CULTURE RECEIVED NO GROWTH TO DATE CULTURE WILL BE HELD FOR 5 DAYS BEFORE ISSUING A FINAL NEGATIVE REPORT     Performed at [facility]   Report Status PENDING   Incomplete  CULTURE, BLOOD (SINGLE)     Status: None   Collection Time    11/23/13  9:50 AM      Result Value Ref Range Status   Specimen Description BLOOD RIGHT ARM   Final   Special Requests BOTTLES DRAWN AEROBIC AND ANAEROBIC 10CC   Final   Culture  Setup Time     Final   Value: 11/23/2013 16:40     Performed at [facility]   Culture     Final   Value:        BLOOD CULTURE RECEIVED NO GROWTH TO DATE CULTURE WILL BE HELD FOR 5 DAYS BEFORE ISSUING A FINAL NEGATIVE REPORT     Performed at [employer]   Report Status PENDING   Incomplete     Labs: Basic Metabolic Panel:  Recent Labs Lab 11/21/13 0602 11/22/13 0412 11/23/13 0416 11/24/13 0535 11/25/13 0456  NA 130* 132* 131* 130* 132*  K 4.5 4.5 4.3 4.1 4.0  CL 101 99 98 95* 96  CO2 20 22 20 21 24   GLUCOSE 86 89 96 99 88  BUN 29* 29* 30* 35* 37*  CREATININE 4.65* 4.90* 4.85* 5.02* 5.11*  CALCIUM 7.6* 7.7* 7.5* 8.1* 8.0*  PHOS 4.0 4.5 4.9* 5.0* 5.8*   Liver Function Tests:  Recent Labs Lab 11/21/13 0602 11/22/13 0412 11/23/13 0416 11/24/13 0535 11/25/13 0456  ALBUMIN 1.3* 1.3* 1.2* 1.3* 1.2*   No results found for this basename: LIPASE, AMYLASE,  in the last 168 hours No results found for this basename: AMMONIA,  in the last 168 hours CBC:  Recent Labs Lab 11/20/13 0453 11/21/13 0602 11/22/13 0412 11/23/13 0416 11/24/13 0535  WBC 7.2 7.1 9.0 9.7 9.3  HGB 10.7* 9.3* 9.3* 9.3* 10.0*  HCT 31.4* 26.8* 27.9* 27.2* 30.4*  MCV 77.7* 77.2* 79.7  77.3* 78.6  PLT 312 256 278 278 346   Cardiac Enzymes: No results found for this basename: CKTOTAL, CKMB, CKMBINDEX, TROPONINI,  in the last 168 hours BNP: BNP (last 3 results)  Recent Labs  11/07/13 0855  PROBNP 33.5   CBG: No results found for this basename: GLUCAP,  in the last 168 hours     Signed:  Zakariyah Freimark  [facility] 11/25/2013, 1:03 PM

## 2013-11-25 NOTE — Progress Notes (Signed)
Pt vitals after ambulating in the hallways 544ft. Pt ambulated independently with  RN supervision and on room air denying any discomfort,SOB except discomfort in BLE. PDarden Palmer Lailana Shira RN.  11/25/13 1251  Vitals  BP 135/71 mmHg  MAP (mmHg) 86  BP Location Right arm  BP Method Automatic  Patient Position (if appropriate) Sitting  Pulse Rate ! 107  Pulse Rate Source Dinamap  Resp 18  Oxygen Therapy  SpO2 94 %  O2 Device None (Room air)

## 2013-11-25 NOTE — Progress Notes (Signed)
Pt given his coumadin and Lovenox. Pt educated and thought on Lovenox administration. Pt able to return education demonstration by self administering his Lovenox. Pt discharge education and instructions completed with pt with family at bedside. All voices denies any questions; pt IV removed and handed his medication prescriptions. Pt discharge home with family to transport him home. Pt given letter for work and school as requested. Francis Gaines Narada Uzzle RN.

## 2013-11-25 NOTE — Progress Notes (Signed)
ANTICOAGULATION CONSULT NOTE - Follow Up Consult  Pharmacy Consult for Lovenox and Coumadin Indication: DVT, bilateral  Allergies  Allergen Reactions  . Augmentin [Amoxicillin-Pot Clavulanate] Other (See Comments)    unknown  . Bactrim [Sulfamethoxazole-Tmp Ds]     Renal failure  . Banana   . Cauliflower [Brassica Oleracea Italica] Other (See Comments)    Pt states his throat closes up  . Other     Patient has multiple food allergies    Patient Measurements: Height: 5\' 7"  (170.2 cm) Weight: 201 lb 15.1 oz (91.6 kg) IBW/kg (Calculated) : 66.1  Vital Signs: Temp: 98.5 F (36.9 C) (09/29 0500) Temp src: Oral (09/29 0500) BP: 132/84 mmHg (09/29 0500) Pulse Rate: 100 (09/29 0500)  Labs:  Recent Labs  11/23/13 0416 11/23/13 0955 11/24/13 0535 11/25/13 0456  HGB 9.3*  --  10.0*  --   HCT 27.2*  --  30.4*  --   PLT 278  --  346  --   LABPROT  --  13.5 14.0 19.1*  INR  --  1.03 1.08 1.60*  CREATININE 4.85*  --  5.02* 5.11*    Estimated Creatinine Clearance: 22.8 ml/min (by C-G formula based on Cr of 5.11).  Assessment:  Day # 3 overlap Lovenox and Coumadin for bilateral DVT. INR up to 1.6 after Coumadin 7.5 mg daily x 2 days.  Expecting discharge today, outpatient PT/INR on 10/1.   HAART added 9/28.  Ritonavir may effect Coumadin needs, as it can induce metabolism. May increase Coumadin needs during initiation.  If Ritonavir is stopped when therapeutic on Coumadin, an increase in INR would be expected.    Discussed Coumadin use with patient, including precautions, monitoring, and potential drug-drug and drug-food interactions.  Goal of Therapy:  INR 2-3 Anti-Xa level 0.6-1 units/ml 4hrs after LMWH dose given Monitor platelets by anticoagulation protocol: Yes   Plan:   Coumadin 5 mg today.    Would continue with Coumadin 5 mg daily until 1st PT/INR check on 10/1, then adjust as needed.  Will adjust Lovenox from 95 to 90 mg SQ q24hrs for current weight. Patient to  learn self-administration.  Continue Lovenox a minumum of 5 days and until INR > 2 for 24 hrs.  Arty Baumgartner, [place] Pager: [phone] 11/25/2013,10:58 AM

## 2013-11-25 NOTE — Discharge Instructions (Signed)
Information on my medicine - Coumadin   (Warfarin)  This medication education was reviewed with me or my healthcare representative as part of my discharge preparation.  The pharmacist that spoke with me during my hospital stay was:  Arty Baumgartner, [facility]  Why was Coumadin prescribed for you? Coumadin was prescribed for you because you have a blood clot or a medical condition that can cause an increased risk of forming blood clots. Blood clots can cause serious health problems by blocking the flow of blood to the heart, lung, or brain. Coumadin can prevent harmful blood clots from forming. As a reminder your indication for Coumadin is:   Deep Vein Thrombosis Treatment  What test will check on my response to Coumadin? While on Coumadin (warfarin) you will need to have an INR test regularly to ensure that your dose is keeping you in the desired range. The INR (international normalized ratio) number is calculated from the result of the laboratory test called prothrombin time (PT).  If an INR APPOINTMENT HAS NOT ALREADY BEEN MADE FOR YOU please schedule an appointment to have this lab work done by your health care provider within 7 days. Your INR goal is usually a number between:  2 to 3 or your provider may give you a more narrow range like 2-2.5.  Ask your health care provider during an office visit what your goal INR is.  What  do you need to  know  About  COUMADIN? Take Coumadin (warfarin) exactly as prescribed by your healthcare provider about the same time each day.  DO NOT stop taking without talking to the doctor who prescribed the medication.  Stopping without other blood clot prevention medication to take the place of Coumadin may increase your risk of developing a new clot or stroke.  Get refills before you run out.  What do you do if you miss a dose? If you miss a dose, take it as soon as you remember on the same day then continue your regularly scheduled regimen the next day.  Do not  take two doses of Coumadin at the same time.  Important Safety Information A possible side effect of Coumadin (Warfarin) is an increased risk of bleeding. You should call your healthcare provider right away if you experience any of the following:   Bleeding from an injury or your nose that does not stop.   Unusual colored urine (red or dark brown) or unusual colored stools (red or black).   Unusual bruising for unknown reasons.   A serious fall or if you hit your head (even if there is no bleeding).  Some foods or medicines interact with Coumadin (warfarin) and might alter your response to warfarin. To help avoid this:   Eat a balanced diet, maintaining a consistent amount of Vitamin K.   Notify your provider about major diet changes you plan to make.   Avoid alcohol or limit your intake to 1 drink for women and 2 drinks for men per day. (1 drink is 5 oz. wine, 12 oz. beer, or 1.5 oz. liquor.)  Make sure that ANY health care provider who prescribes medication for you knows that you are taking Coumadin (warfarin).  Also make sure the healthcare provider who is monitoring your Coumadin knows when you have started a new medication including herbals and non-prescription products.  Coumadin (Warfarin)  Major Drug Interactions  Increased Warfarin Effect Decreased Warfarin Effect  Alcohol (large quantities) Antibiotics (esp. Septra/Bactrim, Flagyl, Cipro) Amiodarone (Cordarone) Aspirin (ASA) Cimetidine (  Tagamet) Megestrol (Megace) NSAIDs (ibuprofen, naproxen, etc.) Piroxicam (Feldene) Propafenone (Rythmol SR) Propranolol (Inderal) Isoniazid (INH) Posaconazole (Noxafil) Barbiturates (Phenobarbital) Carbamazepine (Tegretol) Chlordiazepoxide (Librium) Cholestyramine (Questran) Griseofulvin Oral Contraceptives Rifampin Sucralfate (Carafate) Vitamin K   Coumadin (Warfarin) Major Herbal Interactions  Increased Warfarin Effect Decreased Warfarin Effect  Garlic Ginseng Ginkgo biloba  Coenzyme Q10 Green tea St. Johns wort    Coumadin (Warfarin) FOOD Interactions  Eat a consistent number of servings per week of foods HIGH in Vitamin K (1 serving =  cup)  Collards (cooked, or boiled & drained) Kale (cooked, or boiled & drained) Mustard greens (cooked, or boiled & drained) Parsley *serving size only =  cup Spinach (cooked, or boiled & drained) Swiss chard (cooked, or boiled & drained) Turnip greens (cooked, or boiled & drained)  Eat a consistent number of servings per week of foods MEDIUM-HIGH in Vitamin K (1 serving = 1 cup)  Asparagus (cooked, or boiled & drained) Broccoli (cooked, boiled & drained, or raw & chopped) Brussel sprouts (cooked, or boiled & drained) *serving size only =  cup Lettuce, raw (green leaf, endive, romaine) Spinach, raw Turnip greens, raw & chopped   These websites have more information on Coumadin (warfarin):  FailFactory.se; VeganReport.com.au;

## 2013-11-25 NOTE — Progress Notes (Signed)
Pt transport off unit via wheelchair with mother, auntie and belongings at side. Pt given his 6pm med prior to discharge. Francis Gaines Jamile Sivils RN.

## 2013-11-27 ENCOUNTER — Encounter: Payer: Self-pay | Admitting: Internal Medicine

## 2013-11-27 ENCOUNTER — Ambulatory Visit: Payer: Self-pay | Attending: Internal Medicine | Admitting: Internal Medicine

## 2013-11-27 VITALS — BP 150/90 | HR 104 | Temp 98.0°F | Resp 18 | Ht 67.0 in | Wt 198.0 lb

## 2013-11-27 DIAGNOSIS — Z86718 Personal history of other venous thrombosis and embolism: Secondary | ICD-10-CM | POA: Insufficient documentation

## 2013-11-27 DIAGNOSIS — N032 Chronic nephritic syndrome with diffuse membranous glomerulonephritis: Secondary | ICD-10-CM | POA: Insufficient documentation

## 2013-11-27 DIAGNOSIS — N269 Renal sclerosis, unspecified: Secondary | ICD-10-CM | POA: Insufficient documentation

## 2013-11-27 DIAGNOSIS — L0390 Cellulitis, unspecified: Secondary | ICD-10-CM

## 2013-11-27 DIAGNOSIS — J45909 Unspecified asthma, uncomplicated: Secondary | ICD-10-CM | POA: Insufficient documentation

## 2013-11-27 DIAGNOSIS — Z79899 Other long term (current) drug therapy: Secondary | ICD-10-CM | POA: Insufficient documentation

## 2013-11-27 DIAGNOSIS — B20 Human immunodeficiency virus [HIV] disease: Secondary | ICD-10-CM | POA: Insufficient documentation

## 2013-11-27 DIAGNOSIS — Z7901 Long term (current) use of anticoagulants: Secondary | ICD-10-CM | POA: Insufficient documentation

## 2013-11-27 DIAGNOSIS — I82403 Acute embolism and thrombosis of unspecified deep veins of lower extremity, bilateral: Secondary | ICD-10-CM

## 2013-11-27 DIAGNOSIS — R918 Other nonspecific abnormal finding of lung field: Secondary | ICD-10-CM | POA: Insufficient documentation

## 2013-11-27 DIAGNOSIS — N039 Chronic nephritic syndrome with unspecified morphologic changes: Secondary | ICD-10-CM | POA: Insufficient documentation

## 2013-11-27 HISTORY — DX: Cellulitis, unspecified: L03.90

## 2013-11-27 LAB — POCT INR: INR: 1.9

## 2013-11-27 NOTE — Progress Notes (Signed)
Pt to establish care for newly diagnosis CAP, DVT in both legs, HIV Pt is compliant with taking all medications Pt on last dose Lovenox injections. Taking Coumadin  Denies sob or chest pain Swelling +3 in both legs Sats 94% r/a

## 2013-11-27 NOTE — Progress Notes (Signed)
Patient ID: Jim Wyatt, male   DOB: 11/15/1980, 33 y.o.   MRN: [id number]   Jim Wyatt, is a 32 y.o. male  [id number]  [id number]  DOB - 1980/03/05  CC:  Chief Complaint  Patient presents with  . Hospitalization Follow-up  . Pneumonia  . DVT  . Establish Care       HPI: Jim Wyatt is a 33 y.o. male here today to establish medical care. Young man with past medical history of asthma and recently treated for community acquired pneumonia was admitted to the hospital on 11/07/2013 with a new diagnosis of HIV/AIDS, acute renal failure-biopsy showed FSGS and ATN, positive CMV titers, elevated liver enzymes, and bilateral DVT. Patient is currently on multiple medications including anticoagulation with Coumadin, bridging with Lovenox injection. Renal ultrasound is negative for hydronephrosis. CT chest and abdomen reveals left pulmonary infiltrates and infectious disease specialist is considering probable PCP pneumonia, patient is on primaquine. Patient is here today as hospital follow-up. He has no new complaints. Patient claims compliance with medications. He still has swelling bilaterally on his lower limbs. Patient has No headache, No chest pain, No abdominal pain - No Nausea, No new weakness tingling or numbness, denies shortness of breath.  Allergies  Allergen Reactions  . Augmentin [Amoxicillin-Pot Clavulanate] Other (See Comments)    unknown  . Bactrim [Sulfamethoxazole-Tmp Ds]     Renal failure  . Banana   . Cauliflower [Brassica Oleracea Italica] Other (See Comments)    Pt states his throat closes up  . Other     Patient has multiple food allergies   Past Medical History  Diagnosis Date  . Asthma   . Pneumonia   . Shingles   . HIV infection    Current Outpatient Prescriptions on File Prior to Visit  Medication Sig Dispense Refill  . abacavir (ZIAGEN) 300 MG tablet Take 2 tablets (600 mg total) by mouth daily.  60 tablet  0  . albuterol  (PROVENTIL HFA;VENTOLIN HFA) 108 (90 BASE) MCG/ACT inhaler Inhale 2 puffs into the lungs every 6 (six) hours as needed for wheezing or shortness of breath.      [name] azithromycin (ZITHROMAX) 600 MG tablet Take 2 tablets (1,200 mg total) by mouth once a week.  16 tablet  0  . clindamycin (CLEOCIN) 300 MG capsule Take 1 capsule (300 mg total) by mouth every 6 (six) hours. For 5 days  20 capsule  0  . Darunavir Ethanolate (PREZISTA) 800 MG tablet Take 1 tablet (800 mg total) by mouth daily with breakfast.  30 tablet  0  . enoxaparin (LOVENOX) 100 MG/ML injection Inject 0.9 mLs (90 mg total) into the skin daily.  2 Syringe  0  . furosemide (LASIX) 40 MG tablet Take 1 tablet (40 mg total) by mouth 2 (two) times daily.  60 tablet  0  . HYDROcodone-acetaminophen (NORCO/VICODIN) 5-325 MG per tablet Take 1-2 tablets by mouth every 4 (four) hours as needed for moderate pain.  30 tablet  0  . pantoprazole (PROTONIX) 40 MG tablet Take 1 tablet (40 mg total) by mouth daily at 12 noon.  30 tablet  0  . primaquine 26.3 MG tablet Take 2 tablets (30 mg total) by mouth daily. For 5 days  10 tablet  0  . ritonavir (NORVIR) 100 MG TABS tablet Take 1 tablet (100 mg total) by mouth daily with breakfast.  30 tablet  0  . warfarin (COUMADIN) 5 MG tablet Take 1 tablet (5 mg total) by mouth  one time only at 6 PM. Take 9/29 and 9/30 and then based on INR check 10/1  30 tablet  0  . lamiVUDine (EPIVIR) 10 MG/ML solution Take 10 mLs (100 mg total) by mouth daily.  240 mL  0  . polyethylene glycol (MIRALAX / GLYCOLAX) packet Take 17 g by mouth daily as needed for moderate constipation.  14 each  0  . UNABLE TO FIND This note is to excuse Mr.Burdick from work due to medical illness requiring hospitalization till 10/6  1 %  0  . UNABLE TO FIND This note is to excuse Mr.Rafanan from school due to medical illness requiring hospitalization till 10/6  1 %  0  . valGANciclovir (VALCYTE) 450 MG tablet Take 1 tablet (450 mg total) by mouth  every other day.  15 tablet  0   No current facility-administered medications on file prior to visit.   History reviewed. No pertinent family history. History   Social History  . Marital Status: Single    Spouse Name: N/A    Number of Children: N/A  . Years of Education: N/A   Occupational History  . Not on file.   Social History Main Topics  . Smoking status: Never Smoker   . Smokeless tobacco: Not on file  . Alcohol Use: No  . Drug Use: No  . Sexual Activity: Yes    Partners: Female   Other Topics Concern  . Not on file   Social History Narrative  . No narrative on file    Review of Systems: Constitutional: Negative for fever, chills, diaphoresis, activity change, appetite change and fatigue. HENT: Negative for ear pain, nosebleeds, congestion, facial swelling, rhinorrhea, neck pain, neck stiffness and ear discharge.  Eyes: Negative for pain, discharge, redness, itching and visual disturbance. Respiratory: Negative for cough, choking, chest tightness, shortness of breath, wheezing and stridor.  Cardiovascular: Negative for chest pain, palpitations and leg swelling. Gastrointestinal: Negative for abdominal distention. Genitourinary: Negative for dysuria, urgency, frequency, hematuria, flank pain, decreased urine volume, difficulty urinating and dyspareunia.  Musculoskeletal: Negative for back pain, joint swelling, arthralgia and gait problem. Neurological: Negative for dizziness, tremors, seizures, syncope, facial asymmetry, speech difficulty, weakness, light-headedness, numbness and headaches.  Hematological: Negative for adenopathy. Does not bruise/bleed easily. Psychiatric/Behavioral: Negative for hallucinations, behavioral problems, confusion, dysphoric mood, decreased concentration and agitation.    Objective:   Filed Vitals:   11/27/13 1146  BP: 150/90  Pulse: 104  Temp: 98 F (36.7 C)  Resp: 18    Physical Exam: Constitutional: Patient appears  well-developed and well-nourished. No distress. HENT: Normocephalic, atraumatic, External right and left ear normal. Oropharynx is clear and moist.  Eyes: Conjunctivae and EOM are normal. PERRLA, no scleral icterus. Neck: Normal ROM. Neck supple. No JVD. No tracheal deviation. No thyromegaly. CVS: RRR, S1/S2 +, no murmurs, no gallops, no carotid bruit.  Pulmonary: Effort and breath sounds normal, no stridor, rhonchi, wheezes, rales.  Abdominal: Soft. BS +, no distension, tenderness, rebound or guarding.  Musculoskeletal: Normal range of motion. Bilateral lower limb edema ++ Lymphadenopathy: No lymphadenopathy noted, cervical, inguinal or axillary Neuro: Alert. Normal reflexes, muscle tone coordination. No cranial nerve deficit. Skin: Skin is warm and dry. No rash noted. Not diaphoretic. No erythema. No pallor. Psychiatric: Normal mood and affect. Behavior, judgment, thought content normal.  Lab Results  Component Value Date   WBC 9.3 11/24/2013   HGB 10.0* 11/24/2013   HCT 30.4* 11/24/2013   MCV 78.6 11/24/2013   PLT 346 11/24/2013  Lab Results  Component Value Date   CREATININE 5.11* 11/25/2013   BUN 37* 11/25/2013   NA 132* 11/25/2013   K 4.0 11/25/2013   CL 96 11/25/2013   CO2 24 11/25/2013    No results found for this basename: HGBA1C   Lipid Panel     Component Value Date/Time   CHOL 100 11/15/2013 0820   TRIG 327* 11/15/2013 0820   HDL 8* 11/15/2013 0820   CHOLHDL 12.5 11/15/2013 0820   VLDL 65* 11/15/2013 0820   LDLCALC 27 11/15/2013 0820       Assessment and plan:   1. DVT (deep venous thrombosis), bilateral  - INR Continue Coumadin and Lovenox until INR is therapeutic Return in one week  2. Human immunodeficiency virus (HIV) disease  - COMPLETE METABOLIC PANEL WITH GFR; Future Follow-up with infectious disease specialist  3. 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  The patient was given clear instructions to go to ER or return to medical center if symptoms don't improve, worsen or new problems develop. The patient verbalized understanding. The patient was told to call to get lab results if they haven't heard anything in the next week.     This note has been created with Surveyor, quantity. Any transcriptional errors are unintentional.    [name], MD, [place], [place], [place], [place]   11/27/2013, 12:24 PM

## 2013-11-28 ENCOUNTER — Other Ambulatory Visit: Payer: Self-pay | Admitting: Licensed Clinical Social Worker

## 2013-11-28 ENCOUNTER — Encounter (HOSPITAL_COMMUNITY): Payer: Self-pay

## 2013-11-28 DIAGNOSIS — B20 Human immunodeficiency virus [HIV] disease: Secondary | ICD-10-CM

## 2013-11-28 MED ORDER — LAMIVUDINE 10 MG/ML PO SOLN: 100.0000 mg | Freq: Every day | ORAL | Status: DC

## 2013-11-28 MED ORDER — VALGANCICLOVIR HCL 450 MG PO TABS: 450.0000 mg | ORAL_TABLET | ORAL | Status: DC

## 2013-11-28 MED ORDER — RITONAVIR 100 MG PO TABS: 100.0000 mg | ORAL_TABLET | Freq: Every day | ORAL | Status: DC

## 2013-11-28 MED ORDER — AZITHROMYCIN 600 MG PO TABS: 1200.0000 mg | ORAL_TABLET | ORAL | Status: DC

## 2013-11-28 MED ORDER — ABACAVIR SULFATE 300 MG PO TABS: 600.0000 mg | ORAL_TABLET | Freq: Every day | ORAL | Status: DC

## 2013-11-28 MED ORDER — DARUNAVIR ETHANOLATE 800 MG PO TABS: 800.0000 mg | ORAL_TABLET | Freq: Every day | ORAL | Status: DC

## 2013-11-29 LAB — CULTURE, BLOOD (SINGLE)
Culture: NO GROWTH
Culture: NO GROWTH

## 2013-12-01 ENCOUNTER — Other Ambulatory Visit: Payer: Self-pay | Admitting: Infectious Diseases

## 2013-12-01 DIAGNOSIS — B20 Human immunodeficiency virus [HIV] disease: Secondary | ICD-10-CM

## 2013-12-01 MED ORDER — VALGANCICLOVIR HCL 450 MG PO TABS: 450.0000 mg | ORAL_TABLET | ORAL | Status: DC

## 2013-12-01 MED ORDER — RALTEGRAVIR POTASSIUM 400 MG PO TABS: 400.0000 mg | ORAL_TABLET | Freq: Two times a day (BID) | ORAL | Status: DC

## 2013-12-01 MED ORDER — DOLUTEGRAVIR SODIUM 50 MG PO TABS: 50.0000 mg | ORAL_TABLET | Freq: Every day | ORAL | Status: DC

## 2013-12-02 ENCOUNTER — Encounter (HOSPITAL_COMMUNITY): Payer: Self-pay | Admitting: Emergency Medicine

## 2013-12-02 ENCOUNTER — Emergency Department (HOSPITAL_COMMUNITY)
Admission: EM | Admit: 2013-12-02 | Discharge: 2013-12-02 | Disposition: A | Discharge status: Home / Self Care | Payer: MEDICAID | Attending: Emergency Medicine | Admitting: Emergency Medicine

## 2013-12-02 ENCOUNTER — Telehealth: Payer: Self-pay | Admitting: General Practice

## 2013-12-02 DIAGNOSIS — Z8619 Personal history of other infectious and parasitic diseases: Secondary | ICD-10-CM | POA: Insufficient documentation

## 2013-12-02 DIAGNOSIS — R000 Tachycardia, unspecified: Secondary | ICD-10-CM | POA: Insufficient documentation

## 2013-12-02 DIAGNOSIS — J45909 Unspecified asthma, uncomplicated: Secondary | ICD-10-CM | POA: Insufficient documentation

## 2013-12-02 DIAGNOSIS — B20 Human immunodeficiency virus [HIV] disease: Secondary | ICD-10-CM | POA: Insufficient documentation

## 2013-12-02 DIAGNOSIS — Z86718 Personal history of other venous thrombosis and embolism: Secondary | ICD-10-CM

## 2013-12-02 DIAGNOSIS — Z7901 Long term (current) use of anticoagulants: Secondary | ICD-10-CM | POA: Insufficient documentation

## 2013-12-02 DIAGNOSIS — M7989 Other specified soft tissue disorders: Secondary | ICD-10-CM

## 2013-12-02 DIAGNOSIS — I82402 Acute embolism and thrombosis of unspecified deep veins of left lower extremity: Secondary | ICD-10-CM | POA: Insufficient documentation

## 2013-12-02 DIAGNOSIS — Z792 Long term (current) use of antibiotics: Secondary | ICD-10-CM | POA: Insufficient documentation

## 2013-12-02 DIAGNOSIS — Z8701 Personal history of pneumonia (recurrent): Secondary | ICD-10-CM | POA: Insufficient documentation

## 2013-12-02 DIAGNOSIS — Z79899 Other long term (current) drug therapy: Secondary | ICD-10-CM | POA: Insufficient documentation

## 2013-12-02 LAB — BASIC METABOLIC PANEL
Anion gap: 13 (ref 5–15)
BUN: 20 mg/dL (ref 6–23)
CO2: 20 mEq/L (ref 19–32)
Calcium: 8.3 mg/dL — ABNORMAL LOW (ref 8.4–10.5)
Chloride: 103 mEq/L (ref 96–112)
Creatinine, Ser: 3.09 mg/dL — ABNORMAL HIGH (ref 0.50–1.35)
GFR calc Af Amer: 29 mL/min — ABNORMAL LOW (ref >90)
GFR calc non Af Amer: 25 mL/min — ABNORMAL LOW (ref >90)
Glucose, Bld: 125 mg/dL — ABNORMAL HIGH (ref 70–99)
Potassium: 3.1 mEq/L — ABNORMAL LOW (ref 3.7–5.3)
Sodium: 136 mEq/L — ABNORMAL LOW (ref 137–147)

## 2013-12-02 LAB — CBC WITH DIFFERENTIAL/PLATELET
Basophils Absolute: 0 10*3/uL (ref 0.0–0.1)
Basophils Relative: 0 % (ref 0–1)
Eosinophils Absolute: 0.1 10*3/uL (ref 0.0–0.7)
Eosinophils Relative: 2 % (ref 0–5)
HCT: 25.7 % — ABNORMAL LOW (ref 39.0–52.0)
Hemoglobin: 8.4 g/dL — ABNORMAL LOW (ref 13.0–17.0)
Lymphocytes Relative: 18 % (ref 12–46)
Lymphs Abs: 1.5 10*3/uL (ref 0.7–4.0)
MCH: 25.6 pg — ABNORMAL LOW (ref 26.0–34.0)
MCHC: 32.7 g/dL (ref 30.0–36.0)
MCV: 78.4 fL (ref 78.0–100.0)
Monocytes Absolute: 0.9 10*3/uL (ref 0.1–1.0)
Monocytes Relative: 11 % (ref 3–12)
Neutro Abs: 5.7 10*3/uL (ref 1.7–7.7)
Neutrophils Relative %: 69 % (ref 43–77)
Platelets: 294 10*3/uL (ref 150–400)
RBC: 3.28 MIL/uL — ABNORMAL LOW (ref 4.22–5.81)
RDW: 14.9 % (ref 11.5–15.5)
WBC: 8.2 10*3/uL (ref 4.0–10.5)

## 2013-12-02 LAB — APTT: aPTT: 44 seconds — ABNORMAL HIGH (ref 24–37)

## 2013-12-02 LAB — PROTIME-INR
INR: 1.58 — ABNORMAL HIGH (ref 0.00–1.49)
Prothrombin Time: 18.9 seconds — ABNORMAL HIGH (ref 11.6–15.2)

## 2013-12-02 MED ORDER — SODIUM CHLORIDE 0.9 % IV BOLUS (SEPSIS)
1000.0000 mL | Freq: Once | INTRAVENOUS | Status: AC
  Administered 2013-12-02: 1000 mL via INTRAVENOUS

## 2013-12-02 MED ORDER — HYDROCODONE-ACETAMINOPHEN 5-325 MG PO TABS: 1.0000 | ORAL_TABLET | ORAL | Status: DC | PRN

## 2013-12-02 MED ORDER — HYDROCODONE-ACETAMINOPHEN 5-325 MG PO TABS
2.0000 | ORAL_TABLET | Freq: Once | ORAL | Status: AC
  Administered 2013-12-02: 2 via ORAL
  Filled 2013-12-02: qty 2

## 2013-12-02 MED ORDER — SODIUM CHLORIDE 0.9 % IV SOLN
Freq: Once | INTRAVENOUS | Status: AC
  Administered 2013-12-02: 11:00:00 via INTRAVENOUS

## 2013-12-02 NOTE — ED Provider Notes (Signed)
CSN: [id number]     Arrival date & time 12/02/13  [id number] History   First MD Initiated Contact with Patient 12/02/13 [phone]     Chief Complaint  Patient presents with  . Leg Pain    Left known DVT     (Consider location/radiation/quality/duration/timing/severity/associated sxs/prior Treatment) Patient is a 33 y.o. male presenting with leg pain. The history is provided by the patient. No language interpreter was used.  Leg Pain Location:  Leg Injury: no   Leg location:  L leg Pain details:    Severity:  Moderate   Progression:  Worsening Associated symptoms: no fever   Associated symptoms comment:  Patient returns to the hospital after discharge home on 11/27/13 after admission for new diagnosis of bilateral LE DVT, HIV and pneumonia. He complains of pain in the left lower leg with increased swelling. He reports he is compliant with his medications and has been for his hospital follow up appointment with the [facility] since discharge. No chest pain or SOB. No fever, N, V.   Past Medical History  Diagnosis Date  . Asthma   . Pneumonia   . Shingles   . HIV infection    Past Surgical History  Procedure Laterality Date  . Femur fracture surgery     History reviewed. No pertinent family history. History  Substance Use Topics  . Smoking status: Never Smoker   . Smokeless tobacco: Not on file  . Alcohol Use: No    Review of Systems  Constitutional: Negative for fever and chills.  HENT: Negative.   Respiratory: Negative.   Cardiovascular: Negative.   Gastrointestinal: Negative.   Musculoskeletal:       See HPI.  Skin: Negative.  Negative for color change.  Neurological: Negative.  Negative for numbness.      Allergies  Augmentin; Bactrim; Banana; Cauliflower; and Other  Home Medications   Prior to Admission medications   Medication Sig Start Date End Date Taking? Authorizing Provider  abacavir (ZIAGEN) 300 MG tablet Take 2 tablets (600 mg total) by mouth  daily. 11/28/13  Yes Carlyle Basques, MD  albuterol (PROVENTIL HFA;VENTOLIN HFA) 108 (90 BASE) MCG/ACT inhaler Inhale 2 puffs into the lungs every 6 (six) hours as needed for wheezing or shortness of breath.   Yes Historical Provider, MD  azithromycin (ZITHROMAX) 600 MG tablet Take 2 tablets (1,200 mg total) by mouth once a week. 11/28/13  Yes Carlyle Basques, MD  clindamycin (CLEOCIN) 300 MG capsule Take 1 capsule (300 mg total) by mouth every 6 (six) hours. For 5 days 11/25/13  Yes Domenic Polite, MD  furosemide (LASIX) 40 MG tablet Take 1 tablet (40 mg total) by mouth 2 (two) times daily. 11/25/13  Yes Domenic Polite, MD  HYDROcodone-acetaminophen (NORCO/VICODIN) 5-325 MG per tablet Take 1-2 tablets by mouth every 4 (four) hours as needed for moderate pain. 11/25/13  Yes Domenic Polite, MD  pantoprazole (PROTONIX) 40 MG tablet Take 1 tablet (40 mg total) by mouth daily at 12 noon. 11/25/13  Yes Domenic Polite, MD  warfarin (COUMADIN) 5 MG tablet Take 5 mg by mouth daily.   Yes Historical Provider, MD  lamiVUDine (EPIVIR) 10 MG/ML solution Take 10 mLs (100 mg total) by mouth daily. 11/28/13   Carlyle Basques, MD  primaquine 26.3 MG tablet Take 2 tablets (30 mg total) by mouth daily. For 5 days 11/25/13   Domenic Polite, MD  raltegravir (ISENTRESS) 400 MG tablet Take 1 tablet (400 mg total) by mouth 2 (two) times daily.  12/01/13   Campbell Riches, MD  valGANciclovir (VALCYTE) 450 MG tablet Take 1 tablet (450 mg total) by mouth every other day. 12/01/13   Campbell Riches, MD   BP 130/75  Pulse 113  Temp(Src) 98 F (36.7 C) (Oral)  Resp 18  Ht 5\' 7"  (1.702 m)  Wt 187 lb (84.823 kg)  BMI 29.28 kg/m2  SpO2 98% Physical Exam  Constitutional: He is oriented to person, place, and time. He appears well-developed and well-nourished.  HENT:  Head: Normocephalic.  Neck: Normal range of motion. Neck supple.  Cardiovascular: Regular rhythm and intact distal pulses.  Tachycardia present.   No murmur  heard. Pulmonary/Chest: Effort normal and breath sounds normal.  Abdominal: Soft. Bowel sounds are normal. There is no tenderness. There is no rebound and no guarding.  Musculoskeletal: Normal range of motion.  Left LE markedly swollen at the calf. Generally tender, soft. No discoloration. LE's are of equal temperature.   Neurological: He is alert and oriented to person, place, and time.  Skin: Skin is warm and dry. No rash noted.  Psychiatric: He has a normal mood and affect.    ED Course  Procedures (including critical care time) Labs Review Labs Reviewed  CBC WITH DIFFERENTIAL  PROTIME-INR  APTT  BASIC METABOLIC PANEL   Results for orders placed during the hospital encounter of 12/02/13  CBC WITH DIFFERENTIAL      Result Value Ref Range   WBC 8.2  4.0 - 10.5 K/uL   RBC 3.28 (*) 4.22 - 5.81 MIL/uL   Hemoglobin 8.4 (*) 13.0 - 17.0 g/dL   HCT 25.7 (*) 39.0 - 52.0 %   MCV 78.4  78.0 - 100.0 fL   MCH 25.6 (*) 26.0 - 34.0 pg   MCHC 32.7  30.0 - 36.0 g/dL   RDW 14.9  11.5 - 15.5 %   Platelets 294  150 - 400 K/uL   Neutrophils Relative % 69  43 - 77 %   Neutro Abs 5.7  1.7 - 7.7 K/uL   Lymphocytes Relative 18  12 - 46 %   Lymphs Abs 1.5  0.7 - 4.0 K/uL   Monocytes Relative 11  3 - 12 %   Monocytes Absolute 0.9  0.1 - 1.0 K/uL   Eosinophils Relative 2  0 - 5 %   Eosinophils Absolute 0.1  0.0 - 0.7 K/uL   Basophils Relative 0  0 - 1 %   Basophils Absolute 0.0  0.0 - 0.1 K/uL  PROTIME-INR      Result Value Ref Range   Prothrombin Time 18.9 (*) 11.6 - 15.2 seconds   INR 1.58 (*) 0.00 - 1.49  APTT      Result Value Ref Range   aPTT 44 (*) 24 - 37 seconds  BASIC METABOLIC PANEL      Result Value Ref Range   Sodium 136 (*) 137 - 147 mEq/L   Potassium 3.1 (*) 3.7 - 5.3 mEq/L   Chloride 103  96 - 112 mEq/L   CO2 20  19 - 32 mEq/L   Glucose, Bld 125 (*) 70 - 99 mg/dL   BUN 20  6 - 23 mg/dL   Creatinine, Ser 3.09 (*) 0.50 - 1.35 mg/dL   Calcium 8.3 (*) 8.4 - 10.5 mg/dL   GFR  calc non Af Amer 25 (*) >90 mL/min   GFR calc Af Amer 29 (*) >90 mL/min   Anion gap 13  5 - 15    Imaging  Review No results found.   EKG Interpretation None      MDM   Final diagnoses:  None    1. Lower extremity pain 2. History of DVT  Pain is improved with medications. Discussed with Dr. Audie Pinto. Contact was made with patient's primary care physician so that he can direct patient as to any recommended changes to Coumadin dosing can be arranged. Vital signs improved. He was noted to be tachycardic on arrival, improved with fluids, and also present on previous visits to ED. He is stable for discharge home.     Dewaine Oats, PA-C 12/03/13 2259

## 2013-12-02 NOTE — Telephone Encounter (Signed)
Nurse from [facility] is calling to get some advice to for pt. , nurse states that she had to go ER for coumadin levels. Nurse would like to know if Dr. Lynnda Child like to change medications for pt. Because coumadin levels were off. Please f/u with pt.

## 2013-12-02 NOTE — ED Notes (Signed)
33 yo via POV with c/o Left leg pain. 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 Pt has known blood clot in left leg. Reports pain increasing last pm. Pt took 2 Vicodin as prescribed at 8:45 w/o relief. [name]/o x3. Denies, Fever/chills, N/V/D.

## 2013-12-02 NOTE — Discharge Instructions (Signed)
KEEP YOUR SCHEDULED APPOINTMENT WITH DR. Doreene Burke THIS WEEK. RETURN HERE WITH ANY WORSENING SYMPTOMS OR NEW CONCERNS.

## 2013-12-02 NOTE — ED Notes (Signed)
Pt resting, watching tv; no needs at this time 

## 2013-12-04 ENCOUNTER — Inpatient Hospital Stay (HOSPITAL_COMMUNITY)
Admission: EM | Admit: 2013-12-04 | Discharge: 2013-12-08 | DRG: 974 | Disposition: A | Discharge status: Home / Self Care | Payer: Self-pay | Attending: Internal Medicine | Admitting: Internal Medicine

## 2013-12-04 ENCOUNTER — Telehealth: Payer: Self-pay

## 2013-12-04 ENCOUNTER — Emergency Department (HOSPITAL_COMMUNITY): Payer: Self-pay

## 2013-12-04 ENCOUNTER — Encounter (HOSPITAL_COMMUNITY): Payer: Self-pay | Admitting: Emergency Medicine

## 2013-12-04 ENCOUNTER — Ambulatory Visit (HOSPITAL_COMMUNITY)
Admission: RE | Admit: 2013-12-04 | Discharge: 2013-12-04 | Disposition: A | Discharge status: Home / Self Care | Payer: MEDICAID | Source: Ambulatory Visit | Attending: Vascular Surgery | Admitting: Vascular Surgery

## 2013-12-04 ENCOUNTER — Ambulatory Visit: Payer: Self-pay | Attending: Internal Medicine | Admitting: Internal Medicine

## 2013-12-04 VITALS — BP 139/97 | HR 114 | Temp 98.9°F | Resp 16

## 2013-12-04 DIAGNOSIS — I82409 Acute embolism and thrombosis of unspecified deep veins of unspecified lower extremity: Secondary | ICD-10-CM | POA: Diagnosis present

## 2013-12-04 DIAGNOSIS — R748 Abnormal levels of other serum enzymes: Secondary | ICD-10-CM

## 2013-12-04 DIAGNOSIS — Z881 Allergy status to other antibiotic agents status: Secondary | ICD-10-CM

## 2013-12-04 DIAGNOSIS — N269 Renal sclerosis, unspecified: Secondary | ICD-10-CM | POA: Diagnosis present

## 2013-12-04 DIAGNOSIS — Z6828 Body mass index (BMI) 28.0-28.9, adult: Secondary | ICD-10-CM

## 2013-12-04 DIAGNOSIS — M79609 Pain in unspecified limb: Secondary | ICD-10-CM

## 2013-12-04 DIAGNOSIS — N039 Chronic nephritic syndrome with unspecified morphologic changes: Secondary | ICD-10-CM | POA: Diagnosis present

## 2013-12-04 DIAGNOSIS — M7989 Other specified soft tissue disorders: Secondary | ICD-10-CM | POA: Diagnosis present

## 2013-12-04 DIAGNOSIS — N032 Chronic nephritic syndrome with diffuse membranous glomerulonephritis: Secondary | ICD-10-CM

## 2013-12-04 DIAGNOSIS — D638 Anemia in other chronic diseases classified elsewhere: Secondary | ICD-10-CM | POA: Diagnosis present

## 2013-12-04 DIAGNOSIS — N179 Acute kidney failure, unspecified: Secondary | ICD-10-CM

## 2013-12-04 DIAGNOSIS — R6510 Systemic inflammatory response syndrome (SIRS) of non-infectious origin without acute organ dysfunction: Secondary | ICD-10-CM

## 2013-12-04 DIAGNOSIS — Z8619 Personal history of other infectious and parasitic diseases: Secondary | ICD-10-CM

## 2013-12-04 DIAGNOSIS — B20 Human immunodeficiency virus [HIV] disease: Secondary | ICD-10-CM

## 2013-12-04 DIAGNOSIS — R609 Edema, unspecified: Secondary | ICD-10-CM

## 2013-12-04 DIAGNOSIS — A419 Sepsis, unspecified organism: Principal | ICD-10-CM | POA: Diagnosis present

## 2013-12-04 DIAGNOSIS — J4520 Mild intermittent asthma, uncomplicated: Secondary | ICD-10-CM

## 2013-12-04 DIAGNOSIS — N184 Chronic kidney disease, stage 4 (severe): Secondary | ICD-10-CM | POA: Diagnosis present

## 2013-12-04 DIAGNOSIS — L03116 Cellulitis of left lower limb: Secondary | ICD-10-CM

## 2013-12-04 DIAGNOSIS — E43 Unspecified severe protein-calorie malnutrition: Secondary | ICD-10-CM

## 2013-12-04 DIAGNOSIS — E669 Obesity, unspecified: Secondary | ICD-10-CM | POA: Diagnosis present

## 2013-12-04 DIAGNOSIS — R509 Fever, unspecified: Secondary | ICD-10-CM

## 2013-12-04 DIAGNOSIS — J45909 Unspecified asthma, uncomplicated: Secondary | ICD-10-CM | POA: Diagnosis present

## 2013-12-04 DIAGNOSIS — N170 Acute kidney failure with tubular necrosis: Secondary | ICD-10-CM | POA: Diagnosis present

## 2013-12-04 DIAGNOSIS — B259 Cytomegaloviral disease, unspecified: Secondary | ICD-10-CM | POA: Diagnosis present

## 2013-12-04 DIAGNOSIS — M79604 Pain in right leg: Secondary | ICD-10-CM | POA: Insufficient documentation

## 2013-12-04 DIAGNOSIS — I82403 Acute embolism and thrombosis of unspecified deep veins of lower extremity, bilateral: Secondary | ICD-10-CM

## 2013-12-04 DIAGNOSIS — I82402 Acute embolism and thrombosis of unspecified deep veins of left lower extremity: Secondary | ICD-10-CM | POA: Diagnosis present

## 2013-12-04 DIAGNOSIS — Z79899 Other long term (current) drug therapy: Secondary | ICD-10-CM

## 2013-12-04 DIAGNOSIS — M79605 Pain in left leg: Secondary | ICD-10-CM | POA: Insufficient documentation

## 2013-12-04 DIAGNOSIS — Z7901 Long term (current) use of anticoagulants: Secondary | ICD-10-CM

## 2013-12-04 DIAGNOSIS — R600 Localized edema: Secondary | ICD-10-CM

## 2013-12-04 DIAGNOSIS — R0602 Shortness of breath: Secondary | ICD-10-CM | POA: Insufficient documentation

## 2013-12-04 HISTORY — DX: Allergy to other foods: Z91.018

## 2013-12-04 HISTORY — DX: Acute embolism and thrombosis of unspecified deep veins of unspecified lower extremity: I82.409

## 2013-12-04 HISTORY — DX: Other allergy status, other than to drugs and biological substances: Z91.09

## 2013-12-04 HISTORY — DX: Cellulitis, unspecified: L03.90

## 2013-12-04 HISTORY — DX: Pneumonia, unspecified organism: J18.9

## 2013-12-04 LAB — PROTIME-INR
INR: 1.84 — ABNORMAL HIGH (ref 0.00–1.49)
Prothrombin Time: 21.3 seconds — ABNORMAL HIGH (ref 11.6–15.2)

## 2013-12-04 LAB — COMPLETE METABOLIC PANEL WITH GFR
ALT: 9 U/L (ref 0–53)
AST: 19 U/L (ref 0–37)
Albumin: 2 g/dL — ABNORMAL LOW (ref 3.5–5.2)
Alkaline Phosphatase: 60 U/L (ref 39–117)
BUN: 19 mg/dL (ref 6–23)
CO2: 18 mEq/L — ABNORMAL LOW (ref 19–32)
Calcium: 7.9 mg/dL — ABNORMAL LOW (ref 8.4–10.5)
Chloride: 103 mEq/L (ref 96–112)
Creat: 2.64 mg/dL — ABNORMAL HIGH (ref 0.50–1.35)
GFR, Est African American: 36 mL/min — ABNORMAL LOW
GFR, Est Non African American: 31 mL/min — ABNORMAL LOW
Glucose, Bld: 116 mg/dL — ABNORMAL HIGH (ref 70–99)
Potassium: 3.4 mEq/L — ABNORMAL LOW (ref 3.5–5.3)
Sodium: 133 mEq/L — ABNORMAL LOW (ref 135–145)
Total Bilirubin: 0.4 mg/dL (ref 0.2–1.2)
Total Protein: 6.6 g/dL (ref 6.0–8.3)

## 2013-12-04 LAB — COMPREHENSIVE METABOLIC PANEL
ALT: 10 U/L (ref 0–53)
AST: 21 U/L (ref 0–37)
Albumin: 1.3 g/dL — ABNORMAL LOW (ref 3.5–5.2)
Alkaline Phosphatase: 68 U/L (ref 39–117)
Anion gap: 13 (ref 5–15)
BUN: 20 mg/dL (ref 6–23)
CO2: 19 mEq/L (ref 19–32)
Calcium: 8 mg/dL — ABNORMAL LOW (ref 8.4–10.5)
Chloride: 101 mEq/L (ref 96–112)
Creatinine, Ser: 2.67 mg/dL — ABNORMAL HIGH (ref 0.50–1.35)
GFR calc Af Amer: 35 mL/min — ABNORMAL LOW (ref >90)
GFR calc non Af Amer: 30 mL/min — ABNORMAL LOW (ref >90)
Glucose, Bld: 111 mg/dL — ABNORMAL HIGH (ref 70–99)
Potassium: 3.4 mEq/L — ABNORMAL LOW (ref 3.7–5.3)
Sodium: 133 mEq/L — ABNORMAL LOW (ref 137–147)
Total Bilirubin: 0.3 mg/dL (ref 0.3–1.2)
Total Protein: 7.6 g/dL (ref 6.0–8.3)

## 2013-12-04 LAB — CBC WITH DIFFERENTIAL/PLATELET
Basophils Absolute: 0 10*3/uL (ref 0.0–0.1)
Basophils Relative: 1 % (ref 0–1)
Eosinophils Absolute: 0.1 10*3/uL (ref 0.0–0.7)
Eosinophils Relative: 1 % (ref 0–5)
HCT: 25.7 % — ABNORMAL LOW (ref 39.0–52.0)
Hemoglobin: 8.7 g/dL — ABNORMAL LOW (ref 13.0–17.0)
Lymphocytes Relative: 16 % (ref 12–46)
Lymphs Abs: 1.4 10*3/uL (ref 0.7–4.0)
MCH: 26.5 pg (ref 26.0–34.0)
MCHC: 33.9 g/dL (ref 30.0–36.0)
MCV: 78.4 fL (ref 78.0–100.0)
Monocytes Absolute: 0.7 10*3/uL (ref 0.1–1.0)
Monocytes Relative: 8 % (ref 3–12)
Neutro Abs: 6.6 10*3/uL (ref 1.7–7.7)
Neutrophils Relative %: 74 % (ref 43–77)
Platelets: 243 10*3/uL (ref 150–400)
RBC: 3.28 MIL/uL — ABNORMAL LOW (ref 4.22–5.81)
RDW: 15 % (ref 11.5–15.5)
WBC: 8.8 10*3/uL (ref 4.0–10.5)

## 2013-12-04 LAB — URINALYSIS, ROUTINE W REFLEX MICROSCOPIC
Bilirubin Urine: NEGATIVE
Glucose, UA: 250 mg/dL — AB
Ketones, ur: NEGATIVE mg/dL
Leukocytes, UA: NEGATIVE
Nitrite: NEGATIVE
Protein, ur: >300 mg/dL — AB
Specific Gravity, Urine: 1.022 (ref 1.005–1.030)
Urobilinogen, UA: 0.2 mg/dL (ref 0.0–1.0)
pH: 6.5 (ref 5.0–8.0)

## 2013-12-04 LAB — URINE MICROSCOPIC-ADD ON

## 2013-12-04 LAB — HIV-1 GENOTYPR PLUS

## 2013-12-04 LAB — POCT INR: INR: 2

## 2013-12-04 LAB — MAGNESIUM: Magnesium: 1.6 mg/dL (ref 1.5–2.5)

## 2013-12-04 LAB — RETICULOCYTES
RBC.: 3.03 MIL/uL — ABNORMAL LOW (ref 4.22–5.81)
Retic Count, Absolute: 60.6 10*3/uL (ref 19.0–186.0)
Retic Ct Pct: 2 % (ref 0.4–3.1)

## 2013-12-04 LAB — I-STAT CG4 LACTIC ACID, ED: Lactic Acid, Venous: 1.1 mmol/L (ref 0.5–2.2)

## 2013-12-04 MED ORDER — SODIUM CHLORIDE 0.9 % IV BOLUS (SEPSIS)
1000.0000 mL | Freq: Once | INTRAVENOUS | Status: AC
  Administered 2013-12-04: 1000 mL via INTRAVENOUS

## 2013-12-04 MED ORDER — SODIUM CHLORIDE 0.9 % IV SOLN: INTRAVENOUS | Status: DC

## 2013-12-04 MED ORDER — FUROSEMIDE 40 MG PO TABS
40.0000 mg | ORAL_TABLET | Freq: Two times a day (BID) | ORAL | Status: DC
  Administered 2013-12-05 – 2013-12-06 (×3): 40 mg via ORAL
  Filled 2013-12-04 (×6): qty 1

## 2013-12-04 MED ORDER — ACETAMINOPHEN 325 MG PO TABS
650.0000 mg | ORAL_TABLET | Freq: Four times a day (QID) | ORAL | Status: DC | PRN
  Administered 2013-12-04 – 2013-12-05 (×2): 650 mg via ORAL
  Filled 2013-12-04 (×2): qty 2

## 2013-12-04 MED ORDER — HYDROMORPHONE HCL 1 MG/ML IJ SOLN
1.0000 mg | Freq: Once | INTRAMUSCULAR | Status: AC
Start: 2013-12-04 — End: 2013-12-04
  Administered 2013-12-04: 1 mg via INTRAVENOUS
  Filled 2013-12-04: qty 1

## 2013-12-04 MED ORDER — ABACAVIR SULFATE 300 MG PO TABS
600.0000 mg | ORAL_TABLET | Freq: Every day | ORAL | Status: DC
  Administered 2013-12-04 – 2013-12-08 (×5): 600 mg via ORAL
  Filled 2013-12-04 (×5): qty 2

## 2013-12-04 MED ORDER — POTASSIUM CHLORIDE IN NACL 20-0.9 MEQ/L-% IV SOLN
INTRAVENOUS | Status: DC
  Administered 2013-12-04: 22:00:00 via INTRAVENOUS
  Filled 2013-12-04 (×4): qty 1000

## 2013-12-04 MED ORDER — CEFTRIAXONE SODIUM 2 G IJ SOLR
2.0000 g | INTRAMUSCULAR | Status: DC
  Administered 2013-12-04: 2 g via INTRAVENOUS
  Filled 2013-12-04 (×2): qty 2

## 2013-12-04 MED ORDER — LAMIVUDINE 10 MG/ML PO SOLN
150.0000 mg | Freq: Every day | ORAL | Status: DC
  Administered 2013-12-04 – 2013-12-05 (×2): 150 mg via ORAL
  Filled 2013-12-04 (×2): qty 15

## 2013-12-04 MED ORDER — WARFARIN SODIUM 7.5 MG PO TABS
7.5000 mg | ORAL_TABLET | ORAL | Status: AC
  Administered 2013-12-04: 7.5 mg via ORAL
  Filled 2013-12-04: qty 1

## 2013-12-04 MED ORDER — PANTOPRAZOLE SODIUM 40 MG PO TBEC
40.0000 mg | DELAYED_RELEASE_TABLET | Freq: Every day | ORAL | Status: DC
  Administered 2013-12-05 – 2013-12-07 (×3): 40 mg via ORAL
  Filled 2013-12-04 (×3): qty 1

## 2013-12-04 MED ORDER — VANCOMYCIN HCL IN DEXTROSE 1-5 GM/200ML-% IV SOLN
1000.0000 mg | Freq: Once | INTRAVENOUS | Status: DC
  Filled 2013-12-04: qty 200

## 2013-12-04 MED ORDER — ALBUTEROL SULFATE (2.5 MG/3ML) 0.083% IN NEBU: 2.5000 mg | INHALATION_SOLUTION | Freq: Four times a day (QID) | RESPIRATORY_TRACT | Status: DC | PRN

## 2013-12-04 MED ORDER — MORPHINE SULFATE ER 60 MG PO TBCR: 60.0000 mg | EXTENDED_RELEASE_TABLET | Freq: Two times a day (BID) | ORAL | Status: DC

## 2013-12-04 MED ORDER — ONDANSETRON HCL 4 MG/2ML IJ SOLN
4.0000 mg | Freq: Once | INTRAMUSCULAR | Status: AC
  Administered 2013-12-04: 4 mg via INTRAVENOUS
  Filled 2013-12-04: qty 2

## 2013-12-04 MED ORDER — MORPHINE SULFATE ER 30 MG PO TBCR
60.0000 mg | EXTENDED_RELEASE_TABLET | Freq: Two times a day (BID) | ORAL | Status: DC
  Administered 2013-12-04 – 2013-12-08 (×8): 60 mg via ORAL
  Filled 2013-12-04 (×8): qty 2

## 2013-12-04 MED ORDER — VALGANCICLOVIR HCL 450 MG PO TABS
450.0000 mg | ORAL_TABLET | Freq: Every day | ORAL | Status: DC
  Administered 2013-12-04 – 2013-12-08 (×5): 450 mg via ORAL
  Filled 2013-12-04 (×5): qty 1

## 2013-12-04 MED ORDER — HYDROCODONE-ACETAMINOPHEN 5-325 MG PO TABS
1.0000 | ORAL_TABLET | ORAL | Status: DC | PRN
  Administered 2013-12-05 – 2013-12-08 (×5): 2 via ORAL
  Filled 2013-12-04 (×5): qty 2

## 2013-12-04 MED ORDER — AZITHROMYCIN 600 MG PO TABS
1200.0000 mg | ORAL_TABLET | ORAL | Status: DC
  Administered 2013-12-08: 1200 mg via ORAL
  Filled 2013-12-04: qty 2

## 2013-12-04 MED ORDER — WARFARIN - PHARMACIST DOSING INPATIENT: Freq: Every day | Status: DC

## 2013-12-04 MED ORDER — ALBUTEROL SULFATE HFA 108 (90 BASE) MCG/ACT IN AERS: 2.0000 | INHALATION_SPRAY | Freq: Four times a day (QID) | RESPIRATORY_TRACT | Status: DC | PRN

## 2013-12-04 MED ORDER — RALTEGRAVIR POTASSIUM 400 MG PO TABS
400.0000 mg | ORAL_TABLET | Freq: Two times a day (BID) | ORAL | Status: DC
  Administered 2013-12-04 – 2013-12-08 (×8): 400 mg via ORAL
  Filled 2013-12-04 (×10): qty 1

## 2013-12-04 NOTE — ED Provider Notes (Signed)
CSN: [id number]     Arrival date & time 12/04/13  1608 History   First MD Initiated Contact with Patient 12/04/13 1625     Chief Complaint  Patient presents with  . Leg Pain     (Consider location/radiation/quality/duration/timing/severity/associated sxs/prior Treatment) HPI Patient presents today as after recent evaluation for increasing left lower extremity pain, attributed to persistent DVT, now with increasing pain in the extremity, more swelling, and no fever. The patient was discharged from this facility one week ago after admission for pneumonia. He notes no new nausea, vomiting chest pain, belly pain, confusion, disorientation.  The fever began today, though the pain has been present since discharge, increasing noticeably over the past day. Today he was at his 43 office, found to have DVT, sent here for evaluation. Patient states that he takes a medication as directed, including recent antibiotics, which she seemed to be azithromycin, clindamycin, as well as antiviral medication for HIV.  Past Medical History  Diagnosis Date  . Asthma   . Pneumonia   . Shingles   . HIV infection    Past Surgical History  Procedure Laterality Date  . Femur fracture surgery     No family history on file. History  Substance Use Topics  . Smoking status: Never Smoker   . Smokeless tobacco: Not on file  . Alcohol Use: No    Review of Systems  Constitutional: Negative for fever and chills.  HENT: Negative.   Respiratory: Negative.   Cardiovascular: Negative.   Gastrointestinal: Negative.   Musculoskeletal:       See HPI.  Skin: Negative.  Negative for color change.  Neurological: Negative.  Negative for numbness.      Allergies  Augmentin; Bactrim; Banana; Cauliflower; and Other  Home Medications   Prior to Admission medications   Medication Sig Start Date End Date Taking? Authorizing Provider  abacavir (ZIAGEN) 300 MG tablet Take 2 tablets (600 mg total) by mouth  daily. 11/28/13   Carlyle Basques, MD  albuterol (PROVENTIL HFA;VENTOLIN HFA) 108 (90 BASE) MCG/ACT inhaler Inhale 2 puffs into the lungs every 6 (six) hours as needed for wheezing or shortness of breath.    Historical Provider, MD  azithromycin (ZITHROMAX) 600 MG tablet Take 2 tablets (1,200 mg total) by mouth once a week. 11/28/13   Carlyle Basques, MD  clindamycin (CLEOCIN) 300 MG capsule Take 1 capsule (300 mg total) by mouth every 6 (six) hours. For 5 days 11/25/13   Domenic Polite, MD  furosemide (LASIX) 40 MG tablet Take 1 tablet (40 mg total) by mouth 2 (two) times daily. 11/25/13   Domenic Polite, MD  HYDROcodone-acetaminophen (NORCO/VICODIN) 5-325 MG per tablet Take 1-2 tablets by mouth every 4 (four) hours as needed for moderate pain. 11/25/13   Domenic Polite, MD  HYDROcodone-acetaminophen (NORCO/VICODIN) 5-325 MG per tablet Take 1-2 tablets by mouth every 4 (four) hours as needed. 12/02/13   Shari A Upstill, PA-C  lamiVUDine (EPIVIR) 10 MG/ML solution Take 10 mLs (100 mg total) by mouth daily. 11/28/13   Carlyle Basques, MD  morphine (MS CONTIN) 60 MG 12 hr tablet Take 1 tablet (60 mg total) by mouth every 12 (twelve) hours. 12/04/13   [name], MD  pantoprazole (PROTONIX) 40 MG tablet Take 1 tablet (40 mg total) by mouth daily at 12 noon. 11/25/13   Domenic Polite, MD  primaquine 26.3 MG tablet Take 2 tablets (30 mg total) by mouth daily. For 5 days 11/25/13   Domenic Polite, MD  raltegravir (  ISENTRESS) 400 MG tablet Take 1 tablet (400 mg total) by mouth 2 (two) times daily. 12/01/13   Campbell Riches, MD  valGANciclovir (VALCYTE) 450 MG tablet Take 1 tablet (450 mg total) by mouth every other day. 12/01/13   Campbell Riches, MD  warfarin (COUMADIN) 5 MG tablet Take 5 mg by mouth daily.    Historical Provider, MD   BP 147/90  Pulse 109  Temp(Src) 103 F (39.4 C) (Oral)  Resp 16  SpO2 95% Physical Exam  Constitutional: He is oriented to person, place, and time. He appears  well-developed and well-nourished.  HENT:  Head: Normocephalic.  Neck: Normal range of motion. Neck supple.  Cardiovascular: Regular rhythm and intact distal pulses.  Tachycardia present.   No murmur heard. Pulmonary/Chest: Effort normal and breath sounds normal.  Abdominal: Soft. Bowel sounds are normal. There is no tenderness. There is no rebound and no guarding.  Musculoskeletal: Normal range of motion.  Left LE markedly swollen through the thigh distally. The left  is noticeably warmer than the right. Patient has appreciable pulses in both ankles, moves both ankles, and all toes appropriately.   Neurological: He is alert and oriented to person, place, and time.  Skin: Skin is warm and dry. No rash noted.  Psychiatric: He has a normal mood and affect.    ED Course  Procedures (including critical care time) Labs Review Labs Reviewed  CBC WITH DIFFERENTIAL - Abnormal; Notable for the following:    RBC 3.28 (*)    Hemoglobin 8.7 (*)    HCT 25.7 (*)    All other components within normal limits  COMPREHENSIVE METABOLIC PANEL  T-HELPER CELLS (CD4) COUNT  PROTIME-INR  I-STAT CG4 LACTIC ACID, ED    Imaging Review Dg Chest Port 1 View  (if Code Sepsis Called)  12/04/2013   CLINICAL DATA:  Fever.  Recent pneumonia.  Left leg pain.  EXAM: PORTABLE CHEST - 1 VIEW  COMPARISON:  Radiographs dated 11/23/2013 and 11/19/2013 and CT scan of the chest dated 11/07/2013  FINDINGS: Heart size and pulmonary vascularity are normal and the lungs are clear. No osseous abnormality. No effusions.  IMPRESSION: Normal chest.   Electronically Signed   By: Rozetta Nunnery M.D.   On: 12/04/2013 16:56   Cardiac monitor 120 tachycardia, abnormal Pulse oximetry 96% room air normal After the initial evaluation I reviewed the patient's chart, including recent ultrasound study today, outpatient note from 2 days ago, as well as hospital records from his pneumonia.  5:59 PM On exam the patient is tachycardic, and  his fever isn't going down. He appears calm, continues to have appreciable pulses in both lower extremities, no purpura neurologic status each lower extremity. I discussed his case with our infectious disease team in order to select antibiotics to treat his presumed cellulitis.  MDM  This young male with HIV, recent hospitalization for pneumonia now presents with pain, swelling, in the lower extremity, with no fever. Patient is also tachycardic, given his recent hospitalization for pneumonia, but differential was considered.  The patient does not have evidence for pneumonia today, but with concern for cellulitis, as well as compromised immune status, he was admitted for further evaluation and management.     Carmin Muskrat, MD 12/04/13 [phone]

## 2013-12-04 NOTE — Progress Notes (Signed)
Received pt report from James,RN-ED. 

## 2013-12-04 NOTE — ED Notes (Signed)
RN informed of PT temp

## 2013-12-04 NOTE — ED Notes (Signed)
Report attempted 

## 2013-12-04 NOTE — Patient Instructions (Signed)

## 2013-12-04 NOTE — ED Notes (Addendum)
Pt sent from having doppler study completed which confirmed blood clots in both legs. Left leg is swollen, red and hot to touch. Pt also c/o sob after taking morphine at 1430. Pt also presents with fever. Pt discharge from hospital on sept 29 with dx pneumonia.

## 2013-12-04 NOTE — ED Notes (Signed)
Attempted report 

## 2013-12-04 NOTE — Progress Notes (Addendum)
*  Preliminary Results* Bilateral lower extremity venous duplex completed. Bilateral lower extremities are positive for deep vein thrombosis involving the left popliteal, bilateral posterior tibial, and left peroneal veins. There is no evidence of Baker's cyst bilaterally.  Patient encounter: Patient presented to our department shivering, which continued throughout his appointment even after receiving multiple layered heated blankets. He also was reporting some shortness of breath which he states he gets after he takes morphine. He was given 2L oxygen.   Preliminary results have been discussed with Dr.Jegede and he has instructed the patient to go to the emergency department.  12/04/2013  Maudry Mayhew, RVT, RDCS, RDMS

## 2013-12-04 NOTE — ED Notes (Addendum)
Pt sent to ED for further eval for positive blood clots in bilateral legs . Reports L leg swelling started two days ago; L leg swollen from above the knee down, hot to touch, pedal pulses present, CMS intact. Pt recently d/c for hospital for pneumonia, denies shortness of breath on assessment. Has been having chills starting today; fever 103.

## 2013-12-04 NOTE — Progress Notes (Unsigned)
Patient ID: Jim Wyatt, male   DOB: 1980/08/10, 33 y.o.   MRN: [id number]   Jim Wyatt, is a 33 y.o. male  [id number]  [id number]  DOB - 06/15/1980  Chief Complaint  Patient presents with  . Follow-up  . Leg Swelling  . Leg Pain        Subjective:   Jim Wyatt is a 33 y.o. male here today for a follow up visit. Patient has No headache, No chest pain, No abdominal pain - No Nausea, No new weakness tingling or numbness, No Cough - SOB.  No problems updated.  ALLERGIES: Allergies  Allergen Reactions  . Augmentin [Amoxicillin-Pot Clavulanate] Other (See Comments)    unknown  . Bactrim [Sulfamethoxazole-Tmp Ds]     Renal failure  . Banana   . Cauliflower [Brassica Oleracea Italica] Other (See Comments)    Pt states his throat closes up  . Other     Patient has multiple food allergies    PAST MEDICAL HISTORY: Past Medical History  Diagnosis Date  . Asthma   . Pneumonia   . Shingles   . HIV infection     MEDICATIONS AT HOME: Prior to Admission medications   Medication Sig Start Date End Date Taking? Authorizing Provider  abacavir (ZIAGEN) 300 MG tablet Take 2 tablets (600 mg total) by mouth daily. 11/28/13   Carlyle Basques, MD  albuterol (PROVENTIL HFA;VENTOLIN HFA) 108 (90 BASE) MCG/ACT inhaler Inhale 2 puffs into the lungs every 6 (six) hours as needed for wheezing or shortness of breath.    Historical Provider, MD  azithromycin (ZITHROMAX) 600 MG tablet Take 2 tablets (1,200 mg total) by mouth once a week. 11/28/13   Carlyle Basques, MD  clindamycin (CLEOCIN) 300 MG capsule Take 1 capsule (300 mg total) by mouth every 6 (six) hours. For 5 days 11/25/13   Domenic Polite, MD  furosemide (LASIX) 40 MG tablet Take 1 tablet (40 mg total) by mouth 2 (two) times daily. 11/25/13   Domenic Polite, MD  HYDROcodone-acetaminophen (NORCO/VICODIN) 5-325 MG per tablet Take 1-2 tablets by mouth every 4 (four) hours as needed for moderate pain. 11/25/13    Domenic Polite, MD  HYDROcodone-acetaminophen (NORCO/VICODIN) 5-325 MG per tablet Take 1-2 tablets by mouth every 4 (four) hours as needed. 12/02/13   Shari A Upstill, PA-C  lamiVUDine (EPIVIR) 10 MG/ML solution Take 10 mLs (100 mg total) by mouth daily. 11/28/13   Carlyle Basques, MD  pantoprazole (PROTONIX) 40 MG tablet Take 1 tablet (40 mg total) by mouth daily at 12 noon. 11/25/13   Domenic Polite, MD  primaquine 26.3 MG tablet Take 2 tablets (30 mg total) by mouth daily. For 5 days 11/25/13   Domenic Polite, MD  raltegravir (ISENTRESS) 400 MG tablet Take 1 tablet (400 mg total) by mouth 2 (two) times daily. 12/01/13   Campbell Riches, MD  valGANciclovir (VALCYTE) 450 MG tablet Take 1 tablet (450 mg total) by mouth every other day. 12/01/13   Campbell Riches, MD  warfarin (COUMADIN) 5 MG tablet Take 5 mg by mouth daily.    Historical Provider, MD     Objective:   Filed Vitals:   12/04/13 1018  BP: 139/97  Pulse: 114  Temp: 98.9 F (37.2 C)  TempSrc: Oral  Resp: 16  SpO2: 97%    Exam General appearance : Awake, alert, not in any distress. Speech Clear. Not toxic looking HEENT: Atraumatic and Normocephalic, pupils equally reactive to light and accomodation Neck: supple, no JVD. No  cervical lymphadenopathy.  Chest:Good air entry bilaterally, no added sounds  CVS: S1 S2 regular, no murmurs.  Abdomen: Bowel sounds present, Non tender and not distended with no gaurding, rigidity or rebound. Extremities: B/L Lower Ext shows no edema, both legs are warm to touch Neurology: Awake alert, and oriented X 3, CN II-XII intact, Non focal Skin:No Rash Wounds:N/A  Data Review No results found for this basename: HGBA1C     Assessment & Plan   1. Human immunodeficiency virus (HIV) disease *** - COMPLETE METABOLIC PANEL WITH GFR - INR - Lower Extremity Venous Duplex Left; Future      Return if symptoms worsen or fail to improve, for Follow up Pain and comorbidities, DVT.  The  patient was given clear instructions to go to ER or return to medical center if symptoms don't improve, worsen or new problems develop. The patient verbalized understanding. The patient was told to call to get lab results if they haven't heard anything in the next week.   This note has been created with Surveyor, quantity. Any transcriptional errors are unintentional.    [name], MD, [place], [place], [place] and [place], [place]   12/04/2013, 10:40 AM

## 2013-12-04 NOTE — Progress Notes (Signed)
ANTICOAGULATION CONSULT NOTE - Initial Consult  Pharmacy Consult for warfarin Indication: DVT  Allergies  Allergen Reactions  . Augmentin [Amoxicillin-Pot Clavulanate] Other (See Comments)    unknown  . Bactrim [Sulfamethoxazole-Tmp Ds]     Renal failure  . Banana   . Cauliflower [Brassica Oleracea Italica] Other (See Comments)    Pt states his throat closes up  . Corn-Containing Products Hives  . Fish Allergy Hives  . Other     Patient has multiple food allergies  . Vancomycin     Patient states vancomycin caused kidney injury  . Onion Rash   Patient Measurements: Height: 5' 6.93" (170 cm) Weight: 186 lb 15.2 oz (84.8 kg) IBW/kg (Calculated) : 65.94  Vital Signs: Temp: 99.8 F (37.7 C) (10/08 1830) Temp Source: Oral (10/08 1734) BP: 141/75 mmHg (10/08 1830) Pulse Rate: 115 (10/08 1830)  Labs:  Recent Labs  12/02/13 1006 12/04/13 1135 12/04/13 1643 12/04/13 1708  HGB 8.4*  --  8.7*  --   HCT 25.7*  --  25.7*  --   PLT 294  --  243  --   APTT 44*  --   --   --   LABPROT 18.9*  --   --  21.3*  INR 1.58* 2.0  --  1.84*  CREATININE 3.09*  --  2.67*  --     Estimated Creatinine Clearance: 42.1 ml/min (by C-G formula based on Cr of 2.67).   Medical History: Past Medical History  Diagnosis Date  . Asthma   . Pneumonia   . Shingles   . HIV infection    Assessment: 33 y/o male w/ persistent DVT.  Bilateral DVT diagnosed during admission on 09/26, started on warfarin and completed 5 day enoxaparin bridge. Presented today with lower extremity pain, and swelling suspicious for persistent bilateral DVT. Current INR 1.84.  Goal of Therapy: Goal INR of 2-3 Monitor platelets while on anticoagulation   Plan:  Warfarin 7.5 mg x 1 F/u INR, consider enoxaparin bridge while INR sub-therapeutic.  Carl Best 12/04/2013,6:49 PM  Agree with above. Home dose 5mg  daily so will give extra coumadin tonight with subtherapeutic INR.  Nena Jordan, PharmD,  BCPS 12/04/2013, 7:08 PM

## 2013-12-04 NOTE — Progress Notes (Unsigned)
Pt returns today to the clinic with c/o bilat leg swelling with pain 10/10 since going back to work Monday.states he was up walking well on Monday at work, when pain started Wednesday. States it feels like a broken femur. No reports of chest pain or sob associated with symptoms. Temp 98.9 Vss. Swelling seen lower ankle to calf area.  Taking Vicodin for pain INR- 2.0 Coumadin 5 mg tab

## 2013-12-04 NOTE — H&P (Signed)
Hospitalist Admission History and Physical  Patient name: Jim Wyatt Medical record number: [id number] Date of birth: [birth year] Age: 33 y.o. Gender: male  Primary Care Provider: [name], MD  Chief Complaint: sepsis, LLE pain, cellulitis  History of Present Illness:This is a 33 y.o. 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 Pt noted to have been admitted 9/11-9/29 for CAP/PCP PNA with secondary diagnoses of AKI  And DVT. Pt was discharged on coumadin. LE U/S 9/26 showed bilateral LE DVT present-predominantly in post tibial veins bilaterally. Pt states that he has been compliant to coumadin use at home. Denies eating any green/leafy vegetables. Was bridged off of coumadin out pt. Reports progressive worsening pain over the past 1-2 days. Pain poorly controlled on outpt pain regimen. Per mom, pt spike fever 103 at home today. Was discharged on course of clindamycin and primaquine per ID recs. Completed 3-4 days ago. Denies any CP, SOB.  Presented to ER MAXIMUM TEMPERATURE 103, heart rate in the 100s 20/20, respirations in the tens, blood pressure in the 130s to 140s over 90s, satting greater than 95% on room air.significant laboratories include white blood cell count 8.8, hemoglobin 8.7, potassium 3.4, creatinine of 2.67, calcium of 8, INR 1.4.lites are within normal limits at 1.1. CD4 count and UA pending. Preliminary LE u/s in ER shows persistence of bilateral LE DVTs with no reports of extension. CXR WNL.EDP Vanita Panda discussed case with ID (Comer) about abx selection. Recommendation was for vancomycin. However, mother and pt report vancomycin induced AKI with use in the past.   Assessment and Plan: Jim Wyatt is a 33 y.o. year old male presenting with sepsis, LLE pain, Cellulitis   Active Problems:   Sepsis   1- Sepsis  -Will need to be placed on broad spectrum cellulitis  coverage. However, has multiple abx allergies including beta lactams, bactrim and vancomycin.  -also recently completed course of clindamcyin -Will discussed case with ID as to appropriate abx in this case  -pan culture in the interim.   2-LLE pain/cellulitis  -ddx includes postthombotic syndrome/phlegmasia vs. Infection  -cont w/ coumadin for DVT treatment- has eaten some green vegetables including green beans-unclear if this had significant vitamin K assd anticoagulation normalization -? If pt xarelto candidate  -cont with pain regimen  -f/u on abx w/ ID   3- DVT  -as above  -INR 1.84 -pending formal LE U/S imaging results  -no extension of DVT preliminarily  -cont coumadin per pharmacy   4-HIV -newly diagnosed -CD 4 count pending.  -continue HAART treatment regimen  -f/u w/ ID recs   5-CKD  - renal function improving in review of recent blood work  -cont to avoid nephrotoxins in house  -? Vancomycin induced AKI in the past-HOLD  -renal consult as clinically indicated   6-Asthma -no resp distress/hypoxia currently  -cont home albuterol   7-Anemia -likely anemia of chronic disease  -hgb slighly uptrending from recent labs  -no overt signs of bleeding currently -follow-anemia panel   FEN/GI: heart healthy diet-avoid green leafy vegetables  Prophylaxis: coumadin  Disposition: pending further evaluation  Code Status:Full Code    Patient Active Problem List   Diagnosis Date Noted  . Sepsis 12/04/2013  . Human immunodeficiency virus (HIV) disease 11/27/2013  . 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 DVT (deep venous thrombosis) 11/24/2013  . Asthma, chronic 11/24/2013  . Human immunodeficiency virus (HIV)  disease 11/14/2013  . AKI (acute kidney injury) 11/11/2013  . Elevated liver enzymes 11/09/2013  . History of shingles 11/09/2013  . Obesity 11/09/2013  . Pneumonia   . Asthma    Past Medical History: Past  Medical History  Diagnosis Date  . Asthma   . Pneumonia   . Shingles   . HIV infection     Past Surgical History: Past Surgical History  Procedure Laterality Date  . Femur fracture surgery      Social History: History   Social History  . Marital Status: Single    Spouse Name: N/A    Number of Children: N/A  . Years of Education: N/A   Social History Main Topics  . Smoking status: Never Smoker   . Smokeless tobacco: None  . Alcohol Use: No  . Drug Use: No  . Sexual Activity: Yes    Partners: Female   Other Topics Concern  . None   Social History Narrative  . None    Family History: No family history on file.  Allergies: Allergies  Allergen Reactions  . Augmentin [Amoxicillin-Pot Clavulanate] Other (See Comments)    unknown  . Bactrim [Sulfamethoxazole-Tmp Ds]     Renal failure  . Banana   . Cauliflower [Brassica Oleracea Italica] Other (See Comments)    Pt states his throat closes up  . Corn-Containing Products Hives  . Fish Allergy Hives  . Other     Patient has multiple food allergies  . Onion Rash    Current Facility-Administered Medications  Medication Dose Route Frequency Provider Last Rate Last Dose  . 0.9 %  sodium chloride infusion   Intravenous Continuous Shanda Howells, MD      . abacavir [facility]) tablet 600 mg  600 mg Oral Daily Shanda Howells, MD      . acetaminophen (TYLENOL) tablet 650 mg  650 mg Oral Q6H PRN Babette Relic, MD   650 mg at 12/04/13 1707  . albuterol (PROVENTIL HFA;VENTOLIN HFA) 108 (90 BASE) MCG/ACT inhaler 2 puff  2 puff Inhalation Q6H PRN Shanda Howells, MD      . azithromycin [facility]) tablet 1,200 mg  1,200 mg Oral Weekly Shanda Howells, MD      . furosemide (LASIX) tablet 40 mg  40 mg Oral BID Shanda Howells, MD      . HYDROcodone-acetaminophen (NORCO/VICODIN) 5-325 MG per tablet 1-2 tablet  1-2 tablet Oral Q4H PRN Shanda Howells, MD      . lamiVUDine (EPIVIR) 10 MG/ML solution 100 mg  100 mg Oral Daily Shanda Howells, MD       . morphine (MS CONTIN) 12 hr tablet 60 mg  60 mg Oral Q12H Shanda Howells, MD      . Derrill Memo ON 12/05/2013] pantoprazole (PROTONIX) EC tablet 40 mg  40 mg Oral Q1200 Shanda Howells, MD      . raltegravir (ISENTRESS) tablet 400 mg  400 mg Oral BID Shanda Howells, MD      . valGANciclovir (VALCYTE) 450 MG tablet TABS 450 mg  450 mg Oral QODAY Shanda Howells, MD       Current Outpatient Prescriptions  Medication Sig Dispense Refill  . abacavir (ZIAGEN) 300 MG tablet Take 2 tablets (600 mg total) by mouth daily.  60 tablet  0  . albuterol (PROVENTIL HFA;VENTOLIN HFA) 108 (90 BASE) MCG/ACT inhaler Inhale 2 puffs into the lungs every 6 (six) hours as needed for wheezing or shortness of breath.      [name] azithromycin (ZITHROMAX) 600 MG  tablet Take 2 tablets (1,200 mg total) by mouth once a week.  16 tablet  0  . clindamycin (CLEOCIN) 300 MG capsule Take 1 capsule (300 mg total) by mouth every 6 (six) hours. For 5 days  20 capsule  0  . furosemide (LASIX) 40 MG tablet Take 1 tablet (40 mg total) by mouth 2 (two) times daily.  60 tablet  0  . HYDROcodone-acetaminophen (NORCO/VICODIN) 5-325 MG per tablet Take 1-2 tablets by mouth every 4 (four) hours as needed for moderate pain.  30 tablet  0  . lamiVUDine (EPIVIR) 10 MG/ML solution Take 10 mLs (100 mg total) by mouth daily.  240 mL  0  . morphine (MS CONTIN) 60 MG 12 hr tablet Take 1 tablet (60 mg total) by mouth every 12 (twelve) hours.  60 tablet  0  . pantoprazole (PROTONIX) 40 MG tablet Take 1 tablet (40 mg total) by mouth daily at 12 noon.  30 tablet  0  . raltegravir (ISENTRESS) 400 MG tablet Take 1 tablet (400 mg total) by mouth 2 (two) times daily.  180 tablet  3  . warfarin (COUMADIN) 5 MG tablet Take 5 mg by mouth daily at 6 PM.       . valGANciclovir (VALCYTE) 450 MG tablet Take 1 tablet (450 mg total) by mouth every other day.  120 tablet  3   Review Of Systems: 12 point ROS negative except as noted above in HPI.  Physical Exam: Filed Vitals:    12/04/13 1830  BP: 141/75  Pulse: 115  Temp: 99.8 F (37.7 C)  Resp: 15    General: alert and cooperative HEENT: PERRLA and extra ocular movement intact Heart: S1, S2 normal, no murmur, rub or gallop, regular rate and rhythm Lungs: clear to auscultation, no wheezes or rales and unlabored breathing Abdomen: abdomen is soft without significant tenderness, masses, organomegaly or guarding Extremities: bilateral LE L>Rswelling, + redness and TTP on left  Skin:as above  Neurology: normal without focal findings  Labs and Imaging: Lab Results  Component Value Date/Time   NA 133* 12/04/2013  4:43 PM   K 3.4* 12/04/2013  4:43 PM   CL 101 12/04/2013  4:43 PM   CO2 19 12/04/2013  4:43 PM   BUN 20 12/04/2013  4:43 PM   CREATININE 2.67* 12/04/2013  4:43 PM   GLUCOSE 111* 12/04/2013  4:43 PM   Lab Results  Component Value Date   WBC 8.8 12/04/2013   HGB 8.7* 12/04/2013   HCT 25.7* 12/04/2013   MCV 78.4 12/04/2013   PLT 243 12/04/2013    Dg Chest Port 1 View  (if Code Sepsis Called)  12/04/2013   CLINICAL DATA:  Fever.  Recent pneumonia.  Left leg pain.  EXAM: PORTABLE CHEST - 1 VIEW  COMPARISON:  Radiographs dated 11/23/2013 and 11/19/2013 and CT scan of the chest dated 11/07/2013  FINDINGS: Heart size and pulmonary vascularity are normal and the lungs are clear. No osseous abnormality. No effusions.  IMPRESSION: Normal chest.   Electronically Signed   By: Rozetta Nunnery M.D.   On: 12/04/2013 16:56           Shanda Howells MD  Pager: [phone]

## 2013-12-04 NOTE — Telephone Encounter (Signed)
Michelle from Vascular lab called with results from doppler Patient is positive for DVT Call transferred to Dr Doreene Burke

## 2013-12-05 ENCOUNTER — Telehealth: Payer: Self-pay

## 2013-12-05 ENCOUNTER — Encounter (HOSPITAL_COMMUNITY): Payer: Self-pay

## 2013-12-05 DIAGNOSIS — R509 Fever, unspecified: Secondary | ICD-10-CM

## 2013-12-05 DIAGNOSIS — N032 Chronic nephritic syndrome with diffuse membranous glomerulonephritis: Secondary | ICD-10-CM

## 2013-12-05 DIAGNOSIS — B20 Human immunodeficiency virus [HIV] disease: Secondary | ICD-10-CM

## 2013-12-05 DIAGNOSIS — I82403 Acute embolism and thrombosis of unspecified deep veins of lower extremity, bilateral: Secondary | ICD-10-CM

## 2013-12-05 LAB — IRON AND TIBC
Iron: <10 ug/dL — ABNORMAL LOW (ref 42–135)
UIBC: 166 ug/dL (ref 125–400)

## 2013-12-05 LAB — PROTIME-INR
INR: 1.86 — ABNORMAL HIGH (ref 0.00–1.49)
Prothrombin Time: 21.4 seconds — ABNORMAL HIGH (ref 11.6–15.2)

## 2013-12-05 LAB — COMPREHENSIVE METABOLIC PANEL
ALT: 8 U/L (ref 0–53)
AST: 18 U/L (ref 0–37)
Albumin: 1.2 g/dL — ABNORMAL LOW (ref 3.5–5.2)
Alkaline Phosphatase: 64 U/L (ref 39–117)
Anion gap: 12 (ref 5–15)
BUN: 18 mg/dL (ref 6–23)
CO2: 18 mEq/L — ABNORMAL LOW (ref 19–32)
Calcium: 8 mg/dL — ABNORMAL LOW (ref 8.4–10.5)
Chloride: 100 mEq/L (ref 96–112)
Creatinine, Ser: 2.62 mg/dL — ABNORMAL HIGH (ref 0.50–1.35)
GFR calc Af Amer: 36 mL/min — ABNORMAL LOW (ref >90)
GFR calc non Af Amer: 31 mL/min — ABNORMAL LOW (ref >90)
Glucose, Bld: 98 mg/dL (ref 70–99)
Potassium: 3.6 mEq/L — ABNORMAL LOW (ref 3.7–5.3)
Sodium: 130 mEq/L — ABNORMAL LOW (ref 137–147)
Total Bilirubin: 0.2 mg/dL — ABNORMAL LOW (ref 0.3–1.2)
Total Protein: 7.1 g/dL (ref 6.0–8.3)

## 2013-12-05 LAB — FOLATE: Folate: 6.5 ng/mL

## 2013-12-05 LAB — VITAMIN B12: Vitamin B-12: 425 pg/mL (ref 211–911)

## 2013-12-05 LAB — CBC WITH DIFFERENTIAL/PLATELET
Basophils Absolute: 0 10*3/uL (ref 0.0–0.1)
Basophils Relative: 0 % (ref 0–1)
Eosinophils Absolute: 0.1 10*3/uL (ref 0.0–0.7)
Eosinophils Relative: 1 % (ref 0–5)
HCT: 23.7 % — ABNORMAL LOW (ref 39.0–52.0)
Hemoglobin: 8 g/dL — ABNORMAL LOW (ref 13.0–17.0)
Lymphocytes Relative: 21 % (ref 12–46)
Lymphs Abs: 2.2 10*3/uL (ref 0.7–4.0)
MCH: 27.1 pg (ref 26.0–34.0)
MCHC: 33.8 g/dL (ref 30.0–36.0)
MCV: 80.3 fL (ref 78.0–100.0)
Monocytes Absolute: 1.3 10*3/uL — ABNORMAL HIGH (ref 0.1–1.0)
Monocytes Relative: 12 % (ref 3–12)
Neutro Abs: 7 10*3/uL (ref 1.7–7.7)
Neutrophils Relative %: 66 % (ref 43–77)
Platelets: 243 10*3/uL (ref 150–400)
RBC: 2.95 MIL/uL — ABNORMAL LOW (ref 4.22–5.81)
RDW: 15.2 % (ref 11.5–15.5)
WBC: 10.6 10*3/uL — ABNORMAL HIGH (ref 4.0–10.5)

## 2013-12-05 LAB — T-HELPER CELLS (CD4) COUNT (NOT AT ARMC)
CD4 % Helper T Cell: 5 % — ABNORMAL LOW (ref 33–55)
CD4 T Cell Abs: 80 /uL — ABNORMAL LOW (ref 400–2700)

## 2013-12-05 LAB — FERRITIN: Ferritin: 718 ng/mL — ABNORMAL HIGH (ref 22–322)

## 2013-12-05 MED ORDER — BOOST / RESOURCE BREEZE PO LIQD
1.0000 | Freq: Two times a day (BID) | ORAL | Status: DC
  Administered 2013-12-05 – 2013-12-06 (×2): 1 via ORAL

## 2013-12-05 MED ORDER — LAMIVUDINE 150 MG PO TABS
150.0000 mg | ORAL_TABLET | Freq: Every day | ORAL | Status: DC
  Administered 2013-12-06 – 2013-12-08 (×3): 150 mg via ORAL
  Filled 2013-12-05 (×4): qty 1

## 2013-12-05 MED ORDER — WARFARIN SODIUM 7.5 MG PO TABS
7.5000 mg | ORAL_TABLET | Freq: Once | ORAL | Status: AC
  Administered 2013-12-05: 7.5 mg via ORAL
  Filled 2013-12-05: qty 1

## 2013-12-05 MED ORDER — ENOXAPARIN SODIUM 80 MG/0.8ML ~~LOC~~ SOLN
80.0000 mg | Freq: Two times a day (BID) | SUBCUTANEOUS | Status: DC
  Administered 2013-12-05 – 2013-12-06 (×4): 80 mg via SUBCUTANEOUS
  Filled 2013-12-05 (×8): qty 0.8

## 2013-12-05 MED ORDER — HYDROMORPHONE HCL 1 MG/ML IJ SOLN
1.0000 mg | INTRAMUSCULAR | Status: DC | PRN
  Administered 2013-12-05: 1 mg via INTRAVENOUS
  Filled 2013-12-05 (×2): qty 1

## 2013-12-05 NOTE — Progress Notes (Signed)
Pt's hr stays around 120, but moves up to 140's with movement. Broadus John MD made aware.

## 2013-12-05 NOTE — Telephone Encounter (Signed)
[facility] called in reference to patient lab work Wanted to make [place] aware patient albumin level is 2.0 Dr Doreene Burke made aware

## 2013-12-05 NOTE — Consult Note (Signed)
[facility] for Infectious Disease    Date of Admission:  12/04/2013  Date of Consult:  12/05/2013  Reason for Consult: fever, ? cellulitis Referring Physician: Dr. Broadus John   HPI: Jim Wyatt is an 33 y.o. male with newly dx HIV/AIDS, FSGS ATN /HIVAN. 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 He has been started on antiretrovirals. This past week he had worsening of swelling in his left lower extremity was seen in the emergency department and had another Doppler performed. He became febrile and was admitted to the hospital service last night with concerns that he might have cellulitis. On exam he has profound asymmetry of his legs with severe left lower exam the edema. I do not think as much the way to suggest cellulitis. Terms of other symptoms he really has no other focal symptoms to suggest a specific site of infection.  Past Medical History  Diagnosis Date  . Shingles < 2010  . HIV infection dx'd 10/2013  . Cellulitis 11/2013    LLE  . CAP (community acquired pneumonia) 10/2013  . DVT (deep venous thrombosis) 10/2013    BLE  . Multiple food allergies   . Multiple environmental allergies     "trees, dogs, cats"  . Chronic kidney disease   . 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 Femur fracture surgery Right 09/2001  . Fracture surgery    ergies:   Allergies  Allergen Reactions  . Augmentin [Amoxicillin-Pot Clavulanate] Other (See Comments)    unknown  . Bactrim [Sulfamethoxazole-Tmp Ds]     Renal failure  . Banana   . Cauliflower [Brassica Oleracea Italica] Other (See Comments)    Pt states his throat closes up  . Fish Allergy Hives  . Other     Patient has multiple food allergies, says he is allergic to corn, but not corn containing products   . Vancomycin     Patient states  vancomycin caused kidney injury  . Onion Rash     Medications: I have reviewed patients current medications as documented in Epic Anti-infectives   Start     Dose/Rate Route Frequency Ordered Stop   12/08/13 1000  azithromycin (ZITHROMAX) tablet 1,200 mg     1,200 mg Oral Weekly 12/04/13 1844     12/05/13 1730  lamiVUDine (EPIVIR) tablet 150 mg     150 mg Oral Daily 12/05/13 1725     12/04/13 2200  abacavir (ZIAGEN) tablet 600 mg     600 mg Oral Daily 12/04/13 1844     12/04/13 2200  lamiVUDine (EPIVIR) 10 MG/ML solution 150 mg  Status:  Discontinued     150 mg Oral Daily 12/04/13 1844 12/05/13 1725   12/04/13 2200  raltegravir (ISENTRESS) tablet 400 mg     400 mg Oral 2 times daily 12/04/13 1844     12/04/13 2200  valGANciclovir (VALCYTE) 450 MG tablet TABS 450 mg     450 mg Oral Daily 12/04/13 1844     12/04/13 2200  cefTRIAXone (ROCEPHIN) 2 g in dextrose 5 % 50 mL IVPB  Status:  Discontinued     2 g 100 mL/hr over 30 Minutes Intravenous Every 24 hours 12/04/13 2043 12/05/13 1102   12/04/13 1800  vancomycin (VANCOCIN) IVPB 1000 mg/200 mL premix  Status:  Discontinued     1,000 mg 200  mL/hr over 60 Minutes Intravenous  Once 12/04/13 1759 12/04/13 1843      Social History:  reports that he has never smoked. He has never used smokeless tobacco. He reports that he drinks alcohol. He reports that he does not use illicit drugs.  History reviewed. No pertinent family history.  As in HPI and primary teams notes otherwise 12 point review of systems is negative  Blood pressure 140/84, pulse 120, temperature 98.5 F (36.9 C), temperature source Oral, resp. rate 16, height _0  (1.702 m), weight 180 lb 8.9 oz (81.9 kg), SpO2 96.00%. General: Alert and awake, oriented x3, not in any acute distress. HEENT: anicteric sclera, pupils reactive to light and accommodation, EOMI, oropharynx clear and without exudate CVS regular rate, normal r,  no murmur rubs or gallops Chest: clear to  auscultation bilaterally, no wheezing, rales or rhonchi Abdomen: soft nontender, nondistended, normal bowel sounds, Extremities: Severe swelling of his left lower extremity  Neuro: nonfocal, strength and sensation intact   Results for orders placed during the hospital encounter of 12/04/13 (from the past 48 hour(s))  CBC WITH DIFFERENTIAL     Status: Abnormal   Collection Time    12/04/13  4:43 PM      Result Value Ref Range   WBC 8.8  4.0 - 10.5 K/uL   RBC 3.28 (*) 4.22 - 5.81 MIL/uL   Hemoglobin 8.7 (*) 13.0 - 17.0 g/dL   HCT 25.7 (*) 39.0 - 52.0 %   MCV 78.4  78.0 - 100.0 fL   MCH 26.5  26.0 - 34.0 pg   MCHC 33.9  30.0 - 36.0 g/dL   RDW 15.0  11.5 - 15.5 %   Platelets 243  150 - 400 K/uL   Neutrophils Relative % 74  43 - 77 %   Neutro Abs 6.6  1.7 - 7.7 K/uL   Lymphocytes Relative 16  12 - 46 %   Lymphs Abs 1.4  0.7 - 4.0 K/uL   Monocytes Relative 8  3 - 12 %   Monocytes Absolute 0.7  0.1 - 1.0 K/uL   Eosinophils Relative 1  0 - 5 %   Eosinophils Absolute 0.1  0.0 - 0.7 K/uL   Basophils Relative 1  0 - 1 %   Basophils Absolute 0.0  0.0 - 0.1 K/uL  COMPREHENSIVE METABOLIC PANEL     Status: Abnormal   Collection Time    12/04/13  4:43 PM      Result Value Ref Range   Sodium 133 (*) 137 - 147 mEq/L   Potassium 3.4 (*) 3.7 - 5.3 mEq/L   Chloride 101  96 - 112 mEq/L   CO2 19  19 - 32 mEq/L   Glucose, Bld 111 (*) 70 - 99 mg/dL   BUN 20  6 - 23 mg/dL   Creatinine, Ser 2.67 (*) 0.50 - 1.35 mg/dL   Calcium 8.0 (*) 8.4 - 10.5 mg/dL   Total Protein 7.6  6.0 - 8.3 g/dL   Albumin 1.3 (*) 3.5 - 5.2 g/dL   AST 21  0 - 37 U/L   ALT 10  0 - 53 U/L   Alkaline Phosphatase 68  39 - 117 U/L   Total Bilirubin 0.3  0.3 - 1.2 mg/dL   GFR calc non Af Amer 30 (*) >90 mL/min   GFR calc Af Amer 35 (*) >90 mL/min   Comment: (NOTE)     The eGFR has been calculated using the CKD EPI equation.  This calculation has not been validated in all clinical situations.     eGFR's persistently <90  mL/min signify possible Chronic Kidney     Disease.   Anion gap 13  5 - 15  CULTURE, BLOOD (ROUTINE X 2)     Status: None   Collection Time    12/04/13  4:43 PM      Result Value Ref Range   Specimen Description BLOOD ARM RIGHT     Special Requests BOTTLES DRAWN AEROBIC AND ANAEROBIC 5CC     Culture  Setup Time       Value: 12/04/2013 23:08     Performed at [facility]   Culture       Value:        BLOOD CULTURE RECEIVED NO GROWTH TO DATE CULTURE WILL BE HELD FOR 5 DAYS BEFORE ISSUING A FINAL NEGATIVE REPORT     Performed at [facility]   Report Status PENDING    CULTURE, BLOOD (ROUTINE X 2)     Status: None   Collection Time    12/04/13  4:45 PM      Result Value Ref Range   Specimen Description BLOOD FOREARM LEFT     Special Requests BOTTLES DRAWN AEROBIC AND ANAEROBIC 5CC     Culture  Setup Time       Value: 12/04/2013 23:01     Performed at [facility]   Culture       Value:        BLOOD CULTURE RECEIVED NO GROWTH TO DATE CULTURE WILL BE HELD FOR 5 DAYS BEFORE ISSUING A FINAL NEGATIVE REPORT     Performed at [facility]   Report Status PENDING    I-STAT CG4 LACTIC ACID, ED     Status: None   Collection Time    12/04/13  4:52 PM      Result Value Ref Range   Lactic Acid, Venous 1.10  0.5 - 2.2 mmol/L  T-HELPER CELLS (CD4) COUNT     Status: Abnormal   Collection Time    12/04/13  5:08 PM      Result Value Ref Range   CD4 T Cell Abs 80 (*) 400 - 2700 /uL   CD4 % Helper T Cell 5 (*) 33 - 55 %   Comment: Performed at [place]     Status: Abnormal   Collection Time    12/04/13  5:08 PM      Result Value Ref Range   Prothrombin Time 21.3 (*) 11.6 - 15.2 seconds   INR 1.84 (*) 0.00 - 1.49  VITAMIN B12     Status: None   Collection Time    12/04/13  7:12 PM      Result Value Ref Range   Vitamin B-12 425  211 - 911 pg/mL   Comment: Performed at [place]     Status: None    Collection Time    12/04/13  7:12 PM      Result Value Ref Range   Folate 6.5     Comment: (NOTE)     Reference Ranges            Deficient:       0.4 - 3.3 ng/mL            Indeterminate:   3.4 - 5.4 ng/mL            Normal:              >  5.4 ng/mL     Performed at [facility] TIBC     Status: Abnormal   Collection Time    12/04/13  7:12 PM      Result Value Ref Range   Iron <10 (*) 42 - 135 ug/dL   TIBC Not calculated due to Iron <10.  215 - 435 ug/dL   Saturation Ratios Not calculated due to Iron <10.  20 - 55 %   UIBC 166  125 - 400 ug/dL   Comment: Performed at [place]     Status: Abnormal   Collection Time    12/04/13  7:12 PM      Result Value Ref Range   Ferritin 718 (*) 22 - 322 ng/mL   Comment: Performed at [place]     Status: Abnormal   Collection Time    12/04/13  7:12 PM      Result Value Ref Range   Retic Ct Pct 2.0  0.4 - 3.1 %   RBC. 3.03 (*) 4.22 - 5.81 MIL/uL   Retic Count, Manual 60.6  19.0 - 186.0 K/uL  MAGNESIUM     Status: None   Collection Time    12/04/13  7:43 PM      Result Value Ref Range   Magnesium 1.6  1.5 - 2.5 mg/dL  URINALYSIS, ROUTINE W REFLEX MICROSCOPIC     Status: Abnormal   Collection Time    12/04/13  8:41 PM      Result Value Ref Range   Color, Urine YELLOW  YELLOW   APPearance CLEAR  CLEAR   Specific Gravity, Urine 1.022  1.005 - 1.030   pH 6.5  5.0 - 8.0   Glucose, UA 250 (*) NEGATIVE mg/dL   Hgb urine dipstick LARGE (*) NEGATIVE   Bilirubin Urine NEGATIVE  NEGATIVE   Ketones, ur NEGATIVE  NEGATIVE mg/dL   Protein, ur >300 (*) NEGATIVE mg/dL   Urobilinogen, UA 0.2  0.0 - 1.0 mg/dL   Nitrite NEGATIVE  NEGATIVE   Leukocytes, UA NEGATIVE  NEGATIVE  URINE MICROSCOPIC-ADD ON     Status: Abnormal   Collection Time    12/04/13  8:41 PM      Result Value Ref Range   Squamous Epithelial / LPF RARE  RARE   WBC, UA 3-6  <3 WBC/hpf   RBC / HPF 11-20  <3  RBC/hpf   Bacteria, UA RARE  RARE   Casts HYALINE CASTS (*) NEGATIVE   Comment: GRANULAR CAST  CBC WITH DIFFERENTIAL     Status: Abnormal   Collection Time    12/05/13  5:50 AM      Result Value Ref Range   WBC 10.6 (*) 4.0 - 10.5 K/uL   RBC 2.95 (*) 4.22 - 5.81 MIL/uL   Hemoglobin 8.0 (*) 13.0 - 17.0 g/dL   HCT 23.7 (*) 39.0 - 52.0 %   MCV 80.3  78.0 - 100.0 fL   MCH 27.1  26.0 - 34.0 pg   MCHC 33.8  30.0 - 36.0 g/dL   RDW 15.2  11.5 - 15.5 %   Platelets 243  150 - 400 K/uL   Neutrophils Relative % 66  43 - 77 %   Neutro Abs 7.0  1.7 - 7.7 K/uL   Lymphocytes Relative 21  12 - 46 %   Lymphs Abs 2.2  0.7 - 4.0 K/uL   Monocytes Relative 12  3 - 12 %  Monocytes Absolute 1.3 (*) 0.1 - 1.0 K/uL   Eosinophils Relative 1  0 - 5 %   Eosinophils Absolute 0.1  0.0 - 0.7 K/uL   Basophils Relative 0  0 - 1 %   Basophils Absolute 0.0  0.0 - 0.1 K/uL  COMPREHENSIVE METABOLIC PANEL     Status: Abnormal   Collection Time    12/05/13  5:50 AM      Result Value Ref Range   Sodium 130 (*) 137 - 147 mEq/L   Potassium 3.6 (*) 3.7 - 5.3 mEq/L   Chloride 100  96 - 112 mEq/L   CO2 18 (*) 19 - 32 mEq/L   Glucose, Bld 98  70 - 99 mg/dL   BUN 18  6 - 23 mg/dL   Creatinine, Ser 2.62 (*) 0.50 - 1.35 mg/dL   Calcium 8.0 (*) 8.4 - 10.5 mg/dL   Total Protein 7.1  6.0 - 8.3 g/dL   Albumin 1.2 (*) 3.5 - 5.2 g/dL   AST 18  0 - 37 U/L   ALT 8  0 - 53 U/L   Alkaline Phosphatase 64  39 - 117 U/L   Total Bilirubin 0.2 (*) 0.3 - 1.2 mg/dL   GFR calc non Af Amer 31 (*) >90 mL/min   GFR calc Af Amer 36 (*) >90 mL/min   Comment: (NOTE)     The eGFR has been calculated using the CKD EPI equation.     This calculation has not been validated in all clinical situations.     eGFR's persistently <90 mL/min signify possible Chronic Kidney     Disease.   Anion gap 12  5 - 15  PROTIME-INR     Status: Abnormal   Collection Time    12/05/13  5:50 AM      Result Value Ref Range   Prothrombin Time 21.4 (*) 11.6 -  15.2 seconds   INR 1.86 (*) 0.00 - 1.49   _0 (sdes,specrequest,cult,reptstatus)   ) Recent Results (from the past 720 hour(s))  CULTURE, BLOOD (ROUTINE X 2)     Status: None   Collection Time    11/07/13 12:23 AM      Result Value Ref Range Status   Specimen Description BLOOD RIGHT HAND   Final   Special Requests BOTTLES DRAWN AEROBIC AND ANAEROBIC 5CC   Final   Culture  Setup Time     Final   Value: 11/07/2013 04:48     Performed at [facility]   Culture     Final   Value: NO GROWTH 5 DAYS     Performed at [facility]   Report Status 11/13/2013 FINAL   Final  CULTURE, BLOOD (ROUTINE X 2)     Status: None   Collection Time    11/07/13 12:28 AM      Result Value Ref Range Status   Specimen Description BLOOD LEFT ANTECUBITAL   Final   Special Requests BOTTLES DRAWN AEROBIC AND ANAEROBIC 10CC   Final   Culture  Setup Time     Final   Value: 11/07/2013 04:48     Performed at [facility]   Culture     Final   Value: NO GROWTH 5 DAYS     Performed at [facility]   Report Status 11/13/2013 FINAL   Final  URINE CULTURE     Status: None   Collection Time    11/07/13  4:52 PM      Result  Value Ref Range Status   Specimen Description URINE, RANDOM   Final   Special Requests NONE   Final   Culture  Setup Time     Final   Value: 11/07/2013 19:58     Performed at [place]     Final   Value: NO GROWTH     Performed at [facility]   Culture     Final   Value: NO GROWTH     Performed at [facility]   Report Status 11/09/2013 FINAL   Final  GC/CHLAMYDIA PROBE AMP     Status: None   Collection Time    11/07/13  4:52 PM      Result Value Ref Range Status   CT Probe RNA NEGATIVE  NEGATIVE Final   GC Probe RNA NEGATIVE  NEGATIVE Final   Comment: (NOTE)                                                                                               **Normal Reference Range: Negative**           Assay performed using the Gen-Probe APTIMA COMBO2 (R) Assay.     Acceptable specimen types for this assay include APTIMA Swabs (Unisex,     endocervical, urethral, or vaginal), first void urine, and ThinPrep     liquid based cytology samples.     Performed at [facility] DIFFICILE BY PCR     Status: None   Collection Time    11/07/13 10:35 PM      Result Value Ref Range Status   C difficile by pcr NEGATIVE  NEGATIVE Final  RESPIRATORY VIRUS PANEL     Status: None   Collection Time    11/09/13  3:19 PM      Result Value Ref Range Status   Source - RVPAN NASAL SWAB   Corrected   Comment: CORRECTED ON 09/14 AT 2100: PREVIOUSLY REPORTED AS NASAL SWAB   Respiratory Syncytial Virus A NOT DETECTED   Final   Respiratory Syncytial Virus B NOT DETECTED   Final   Influenza A NOT DETECTED   Final   Influenza B NOT DETECTED   Final   Parainfluenza 1 NOT DETECTED   Final   Parainfluenza 2 NOT DETECTED   Final   Parainfluenza 3 NOT DETECTED   Final   Metapneumovirus NOT DETECTED   Final   Rhinovirus NOT DETECTED   Final   Adenovirus NOT DETECTED   Final   Influenza A H1 NOT DETECTED   Final   Influenza A H3 NOT DETECTED   Final   Comment: (NOTE)           Normal Reference Range for each Analyte: NOT DETECTED     Testing performed using the Luminex xTAG Respiratory Viral Panel test     kit.     The analytical performance characteristics of this assay have been     determined by [facility].  The modifications have not been     cleared or approved by the FDA. This  assay has been validated pursuant     to the CLIA regulations and is used for clinical purposes.     Performed at [place], BLOOD (ROUTINE X 2)     Status: None   Collection Time    11/14/13 12:30 PM      Result Value Ref Range Status   Specimen Description BLOOD RIGHT ARM   Final   Special Requests BOTTLES DRAWN AEROBIC ONLY 10CC   Final   Culture  Setup Time     Final   Value:  11/14/2013 21:41     Performed at [facility]   Culture     Final   Value: NO GROWTH 5 DAYS     Performed at [facility]   Report Status 11/20/2013 FINAL   Final  URINE CULTURE     Status: None   Collection Time    11/14/13  5:44 PM      Result Value Ref Range Status   Specimen Description URINE, RANDOM   Final   Special Requests Normal   Final   Culture  Setup Time     Final   Value: 11/14/2013 18:24     Performed at [place]     Final   Value: NO GROWTH     Performed at [facility]   Culture     Final   Value: NO GROWTH     Performed at [facility]   Report Status 11/15/2013 FINAL   Final  AFB CULTURE, BLOOD     Status: None   Collection Time    11/15/13  8:40 PM      Result Value Ref Range Status   Specimen Description BLOOD RIGHT ARM   Final   Special Requests 5CC AFB   Final   Culture     Final   Value: CULTURE WILL BE EXAMINED FOR 6 WEEKS BEFORE ISSUING A FINAL REPORT     Performed at [facility]   Report Status PENDING   Incomplete  CULTURE, BLOOD (SINGLE)     Status: None   Collection Time    11/22/13  9:30 PM      Result Value Ref Range Status   Specimen Description BLOOD RIGHT HAND   Final   Special Requests BOTTLES DRAWN AEROBIC AND ANAEROBIC 5CC EACH   Final   Culture  Setup Time     Final   Value: 11/23/2013 03:31     Performed at [facility]   Culture     Final   Value: NO GROWTH 5 DAYS     Performed at [facility]   Report Status 11/29/2013 FINAL   Final  CULTURE, BLOOD (SINGLE)     Status: None   Collection Time    11/23/13  9:50 AM      Result Value Ref Range Status   Specimen Description BLOOD RIGHT ARM   Final   Special Requests BOTTLES DRAWN AEROBIC AND ANAEROBIC 10CC   Final   Culture  Setup Time     Final   Value: 11/23/2013 16:40     Performed at [facility]   Culture     Final   Value: NO GROWTH 5 DAYS     Performed at [facility]     Report Status 11/29/2013 FINAL   Final  CULTURE, BLOOD (ROUTINE X 2)     Status: None   Collection Time  12/04/13  4:43 PM      Result Value Ref Range Status   Specimen Description BLOOD ARM RIGHT   Final   Special Requests BOTTLES DRAWN AEROBIC AND ANAEROBIC 5CC   Final   Culture  Setup Time     Final   Value: 12/04/2013 23:08     Performed at [facility]   Culture     Final   Value:        BLOOD CULTURE RECEIVED NO GROWTH TO DATE CULTURE WILL BE HELD FOR 5 DAYS BEFORE ISSUING A FINAL NEGATIVE REPORT     Performed at [facility]   Report Status PENDING   Incomplete  CULTURE, BLOOD (ROUTINE X 2)     Status: None   Collection Time    12/04/13  4:45 PM      Result Value Ref Range Status   Specimen Description BLOOD FOREARM LEFT   Final   Special Requests BOTTLES DRAWN AEROBIC AND ANAEROBIC 5CC   Final   Culture  Setup Time     Final   Value: 12/04/2013 23:01     Performed at [facility]   Culture     Final   Value:        BLOOD CULTURE RECEIVED NO GROWTH TO DATE CULTURE WILL BE HELD FOR 5 DAYS BEFORE ISSUING A FINAL NEGATIVE REPORT     Performed at [facility]   Report Status PENDING   Incomplete     Impression/Recommendation  Active Problems:   Sepsis   CLAYVON PARLETT is a 33 y.o. male with  HIV,AIDS FSGS/ATN , bilateral DVT's admitted for fever but without focal signs pointing to specific site of infection. He is still on valcyte for possible CMV infection (he had CMV viremia but I do not know that he had proven end organ infection)  #1 Fevers:  --ok to check AFB blood cultures but I expect his fevers are due to either his HIV itself, IRIS and or his DVT  #2 HIV; continue ARV with epivir increased to 133m tablet and needs PCP prophylaxis will start dapsone  #3 FSGS/ATN HIVAN; renal fxn much improved  #4 DVT: on anticoagulations.  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 We'll try to assess if he has evidence of end organ  infection anywhere       12/05/2013, 5:26 PM   Thank you so much for this interesting consult  [phone](pager) [phone](office) 12/05/2013, 5:26 PM  CRhina BrackettDam 12/05/2013, 5:26 PM

## 2013-12-05 NOTE — Discharge Instructions (Signed)

## 2013-12-05 NOTE — Care Management Note (Signed)
    Page 1 of 1   12/08/2013     11:21:52 AM CARE MANAGEMENT NOTE 12/08/2013  Patient:  Jim Wyatt, Jim Wyatt   Account Number:  [id number]  Date Initiated:  12/05/2013  Documentation initiated by:  Tomi Bamberger  Subjective/Objective Assessment:   dx sepsis  admit- lives with mom.     Action/Plan:   Anticipated DC Date:  12/08/2013   Anticipated DC Plan:  [place]  CM consult      Choice offered to / List presented to:             Status of service:  Completed, signed off Medicare Important Message given?  NO (If response is "NO", the following Medicare IM given date fields will be blank) Date Medicare IM given:   Medicare IM given by:   Date Additional Medicare IM given:   Additional Medicare IM given by:    Discharge Disposition:  HOME/SELF CARE  Per UR Regulation:  Reviewed for med. necessity/level of care/duration of stay  If discussed at [place] of Stay Meetings, dates discussed:    Comments:  12/08/13 [place], BSN [phone] patient is for dc today, No needs anticipated.  12/05/13 1640 Tomi Bamberger RN, BSN [phone] patient lives with mom.  pta indep.  Patient states he does not have a problem with getting his medications unless they have started him on something new.  NCM checked with Dr. Tommy Medal and he states patient will not be starting anything new.

## 2013-12-05 NOTE — Progress Notes (Addendum)
[facility] PROGRESS NOTE  Jim Wyatt [id number] DOB: August 22, 1980 DOA: 12/04/2013 PCP: [name], MD  Assessment/Plan: 1. Fever/SIRS -suspect could be related to DVT in LLE and associated inflammation -I do not see evidence of cellulitis, CXR/UA unremarkable -FU Blood Cx -Will ask ID to eval, stop Rocephin that was started last night -last admission he was treated empirically for PCP pneumonia for which he completed the 21day course  2. 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 HIV/AIDs -diagnosed in 9/15 -on HAARt regimen, which was changed since discharge, per pt due to cost -emergency ADAP application was initiated per Dr.Hatcher piror to discharge  4. AKI -due to ATN and HIVAN, s/p Renal Biopsy -creatinine suspect ATN part improving -due to FU with Dr.Sanford today  5. Asthma -stable  6. Anemia  -due to chronic disease and Iron defi -stable  Code Status: Full Code Family Communication: none at bedside Disposition Plan: home when stable   Consultants:  ID  HPI/Subjective: Complains of worsening L leg pain and swelling for 3-4days  Objective: Filed Vitals:   12/05/13 0806  BP: 144/86  Pulse: 110  Temp: 99.1 F (37.3 C)  Resp:     Intake/Output Summary (Last 24 hours) at 12/05/13 1103 Last data filed at 12/05/13 0500  Gross per 24 hour  Intake    960 ml  Output    350 ml  Net    610 ml   Filed Weights   12/04/13 1734 12/04/13 2025  Weight: 84.8 kg (186 lb 15.2 oz) 81.9 kg (180 lb 8.9 oz)     Exam:   General:  AAOx3, no distress  Cardiovascular: S1S2/RRR  Respiratory: CTAB  Abdomen: soft, NT, BS present  Musculoskeletal: 3plus edema LLE, 1plus in RLE  Data Reviewed: Basic Metabolic Panel:  Recent Labs Lab 12/02/13 1006 12/04/13 0936 12/04/13 1643 12/04/13 1943 12/05/13 0550  NA 136* 133* 133*  --  130*  K 3.1* 3.4* 3.4*  --  3.6*  CL 103 103 101  --  100  CO2 20 18* 19  --  18*  GLUCOSE 125* 116* 111*  --  98  BUN 20 19 20   --  18  CREATININE 3.09* 2.64* 2.67*  --  2.62*  CALCIUM 8.3* 7.9* 8.0*  --  8.0*  MG  --   --   --  1.6  --    Liver Function Tests:  Recent Labs Lab 12/04/13 0936 12/04/13 1643 12/05/13 0550  AST 19 21 18   ALT 9 10 8   ALKPHOS 60 68 64  BILITOT 0.4 0.3 0.2*  PROT 6.6 7.6 7.1  ALBUMIN 2.0* 1.3* 1.2*   No results found for this basename: LIPASE, AMYLASE,  in the last 168 hours No results found for this basename: AMMONIA,  in the last 168 hours CBC:  Recent Labs Lab 12/02/13 1006 12/04/13 1643 12/05/13 0550  WBC 8.2 8.8 10.6*  NEUTROABS 5.7 6.6 7.0  HGB 8.4* 8.7* 8.0*  HCT 25.7* 25.7* 23.7*  MCV 78.4 78.4 80.3  PLT 294 243 243   Cardiac Enzymes: No results found for this basename: CKTOTAL, CKMB, CKMBINDEX, TROPONINI,  in the last 168 hours BNP (last 3 results)  Recent Labs  11/07/13 0855  PROBNP 33.5   CBG: No results found for this basename: GLUCAP,  in the last 168 hours  Recent Results (from the past 240 hour(s))  CULTURE, BLOOD (ROUTINE X 2)     Status: None   Collection Time    12/04/13  4:43 PM      Result Value Ref Range Status   Specimen Description BLOOD ARM RIGHT   Final   Special Requests BOTTLES DRAWN AEROBIC AND ANAEROBIC 5CC   Final   Culture  Setup Time     Final   Value: 12/04/2013 23:08     Performed at [facility]   Culture     Final   Value:        BLOOD CULTURE RECEIVED NO GROWTH TO DATE CULTURE WILL BE HELD FOR 5 DAYS BEFORE ISSUING A FINAL NEGATIVE REPORT      Performed at [facility]   Report Status PENDING   Incomplete  CULTURE, BLOOD (ROUTINE X 2)     Status: None   Collection Time    12/04/13  4:45 PM      Result Value Ref Range Status   Specimen Description BLOOD FOREARM LEFT   Final   Special Requests BOTTLES DRAWN AEROBIC AND ANAEROBIC 5CC   Final   Culture  Setup Time     Final   Value: 12/04/2013 23:01     Performed at [facility]   Culture     Final   Value:        BLOOD CULTURE RECEIVED NO GROWTH TO DATE CULTURE WILL BE HELD FOR 5 DAYS BEFORE ISSUING A FINAL NEGATIVE REPORT     Performed at [facility]   Report Status PENDING   Incomplete     Studies: Dg Chest Port 1 View  (if Code Sepsis Called)  12/04/2013   CLINICAL DATA:  Fever.  Recent pneumonia.  Left leg pain.  EXAM: PORTABLE CHEST - 1 VIEW  COMPARISON:  Radiographs dated 11/23/2013 and 11/19/2013 and CT scan of the chest dated 11/07/2013  FINDINGS: Heart size and pulmonary vascularity are normal and the lungs are clear. No osseous abnormality. No effusions.  IMPRESSION: Normal chest.   Electronically Signed   By: Rozetta Nunnery M.D.   On: 12/04/2013 16:56    Scheduled Meds: . abacavir  600 mg Oral Daily  . [START ON 12/08/2013] azithromycin  1,200 mg Oral Weekly  . furosemide  40 mg Oral BID  . lamiVUDine  150 mg Oral Daily  . morphine  60 mg Oral Q12H  . pantoprazole  40 mg Oral Q1200  . raltegravir  400 mg Oral BID  . valGANciclovir  450 mg Oral Daily  . Warfarin - Pharmacist Dosing Inpatient   Does not apply q1800   Continuous Infusions:  Antibiotics Given (last 72 hours)   Date/Time Action Medication Dose Rate   12/04/13 2133 Given   abacavir (ZIAGEN) tablet 600 mg 600 mg    12/04/13 2133 Given   raltegravir (ISENTRESS) tablet 400 mg 400 mg    12/04/13 2247 Given   lamiVUDine (EPIVIR) 10 MG/ML solution 150 mg 150 mg    12/04/13 2247 Given   valGANciclovir (VALCYTE) 450 MG tablet TABS 450 mg 450 mg    12/04/13 2247 Given    cefTRIAXone (ROCEPHIN) 2 g in dextrose 5 % 50 mL IVPB 2 g 100 mL/hr   12/05/13 0920  Given   abacavir (ZIAGEN) tablet 600 mg 600 mg    12/05/13 0920 Given   lamiVUDine (EPIVIR) 10 MG/ML solution 150 mg 150 mg    12/05/13 0920 Given   raltegravir (ISENTRESS) tablet 400 mg 400 mg    12/05/13 0920 Given   valGANciclovir (VALCYTE) 450 MG tablet TABS 450 mg 450 mg       Active Problems:   Sepsis    Time spent: 62min    [facility] Pager [phone]. If 7PM-7AM, please contact night-coverage at [email or web address], password [facility] 12/05/2013, 11:03 AM  LOS: 1 day

## 2013-12-05 NOTE — Progress Notes (Signed)
INITIAL NUTRITION ASSESSMENT  DOCUMENTATION CODES Per approved criteria  -Severe malnutrition in the context of chronic illness  Pt meets criteria for severe MALNUTRITION in the context of chronic illness as evidenced by 10% weight loss in 1 month and reported intake <75% of estimated needs for >1 month.  INTERVENTION: - Resource Breeze po BID, each supplement provides 250 kcal and 9 grams of protein - RD will continue to monitor.   NUTRITION DIAGNOSIS: Inadequate oral intake related to newly diagnosed HIV and cellulitis as evidenced by wt loss and reported intake less than estimated needs.   Goal: Pt to meet >/= 90% of their estimated nutrition needs   Monitor:  Weight trend, po intake, acceptance of supplements, labs  Reason for Assessment: MST  33 y.o. male  Admitting Dx: <principal problem not specified>  ASSESSMENT: 33 y.o. 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   - Pt with newly diagnosed HIV - Pt reports a 20 lb wt loss within the past month. He attributes this to a decrease in appetite. He says that he eats a McMuffin from [employer] for breakfast, skips lunch, and eats a small dinner.  - Pt with no signs of fat or muscle wasting.   Labs: Na and K Low BUN WNL Height: Ht Readings from Last 1 Encounters:  12/04/13 5\' 7"  (1.702 m)    Weight: Wt Readings from Last 1 Encounters:  12/04/13 180 lb 8.9 oz (81.9 kg)    Ideal Body Weight: 66.1 kg  % Ideal Body Weight: 139%  Wt Readings from Last 10 Encounters:  12/04/13 180 lb 8.9 oz (81.9 kg)  12/02/13 187 lb (84.823 kg)  11/27/13 198 lb (89.812 kg)  11/25/13 201 lb 15.1 oz (91.6 kg)  11/01/13 203 lb (92.08 kg)    Usual Body Weight: 200 lbs  % Usual Body Weight: 90%  BMI:  Body mass index is 28.27 kg/(m^2).  Estimated Nutritional Needs: Kcal: 2050-2300 Protein: 100-120 g Fluid: 2.0-2.3 L/day  Skin:  Intact  Diet Order: Cardiac  EDUCATION NEEDS: -Education needs addressed   Intake/Output Summary (Last 24 hours) at 12/05/13 1113 Last data filed at 12/05/13 0500  Gross per 24 hour  Intake    960 ml  Output    350 ml  Net    610 ml    Last BM: prior to admission   Labs:   Recent Labs Lab 12/04/13 0936 12/04/13 1643 12/04/13 1943 12/05/13 0550  NA 133* 133*  --  130*  K 3.4* 3.4*  --  3.6*  CL 103 101  --  100  CO2 18* 19  --  18*  BUN 19 20  --  18  CREATININE 2.64* 2.67*  --  2.62*  CALCIUM 7.9* 8.0*  --  8.0*  MG  --   --  1.6  --   GLUCOSE 116* 111*  --  98    CBG (last 3)  No results found for this basename: GLUCAP,  in the last 72 hours  Scheduled Meds: . abacavir  600 mg Oral Daily  . [START ON 12/08/2013] azithromycin  1,200 mg Oral Weekly  . enoxaparin (LOVENOX) injection  80 mg Subcutaneous Q12H  . furosemide  40 mg Oral BID  . lamiVUDine  150 mg Oral Daily  . morphine  60 mg Oral Q12H  . pantoprazole  40 mg Oral Q1200  . raltegravir  400 mg Oral BID  .  valGANciclovir  450 mg Oral Daily  . Warfarin - Pharmacist Dosing Inpatient   Does not apply q1800    Continuous Infusions:   Past Medical History  Diagnosis Date  . Shingles < 2010  . HIV infection dx'd 10/2013  . Cellulitis 11/2013    LLE  . CAP (community acquired pneumonia) 10/2013  . DVT (deep venous thrombosis) 10/2013    BLE  . Multiple food allergies   . Multiple environmental allergies     "trees, dogs, cats"  . Chronic kidney disease   . 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 Femur fracture surgery Right 09/2001  . Fracture surgery      Terrace Arabia RD, LDN

## 2013-12-05 NOTE — Progress Notes (Signed)
[facility] [id number] Admission Data: 12/05/2013 12:33 AM Attending Provider: Shanda Howells, MD [id number], Gabrielle Dare, MD Code Status: Full   Jim Wyatt is a 33 y.o. male patient admitted from ED:  -No acute distress noted.  -No complaints of shortness of breath.  -No complaints of chest pain.   Cardiac Monitoring: Box #3 in place. Cardiac monitor yields:normal sinus rhythm.  Blood pressure 146/84, pulse 120, temperature 100.2 F (37.9 C), temperature source Oral, resp. rate 20, height 5\' 7"  (1.702 m), weight 81.9 kg (180 lb 8.9 oz), SpO2 96.00%.   IV Fluids:  IV in place, occlusive dsg intact without redness, IV cath forearm right, condition patent and no redness normal saline with 20K at 148ml/hr.   Allergies:  Augmentin; Bactrim; Banana; Cauliflower; Corn-containing products; Fish allergy; Other; Vancomycin; and Onion  Past Medical History:   has a past medical history of Shingles (< 2010); HIV infection (dx'd 10/2013); Cellulitis (11/2013); CAP (community acquired pneumonia) (10/2013); DVT (deep venous thrombosis) (10/2013); Multiple food allergies; Multiple environmental allergies; Chronic kidney disease; and Vancomycin-induced nephrotoxicity (10/2013).  Past Surgical History:   has past surgical history that includes Femur fracture surgery (Right, 09/2001) and Fracture surgery.  Social History:   reports that he has never smoked. He has never used smokeless tobacco. He reports that he drinks alcohol. He reports that he does not use illicit drugs.  Skin: intact with BLE edema worse on L side, charted in [facility]  Patient/Family orientated to room. Information packet given to patient/family. Admission inpatient armband information verified with patient/family to include name and date of birth and placed on patient arm. Side rails up x 2, fall assessment and education completed with patient/family. Patient/family able to verbalize understanding of risk associated with falls  and verbalized understanding to call for assistance before getting out of bed. Call light within reach. Patient/family able to voice and demonstrate understanding of unit orientation instructions.

## 2013-12-05 NOTE — Progress Notes (Signed)
[facility] for warfarin/Lovenox Indication: DVT  Allergies  Allergen Reactions  . Augmentin [Amoxicillin-Pot Clavulanate] Other (See Comments)    unknown  . Bactrim [Sulfamethoxazole-Tmp Ds]     Renal failure  . Banana   . Cauliflower [Brassica Oleracea Italica] Other (See Comments)    Pt states his throat closes up  . Corn-Containing Products Hives  . Fish Allergy Hives  . Other     Patient has multiple food allergies  . Vancomycin     Patient states vancomycin caused kidney injury  . Onion Rash   Patient Measurements: Height: 5\' 7"  (170.2 cm) Weight: 180 lb 8.9 oz (81.9 kg) IBW/kg (Calculated) : 66.1  Vital Signs: Temp: 99.1 F (37.3 C) (10/09 0806) Temp Source: Oral (10/09 0806) BP: 144/86 mmHg (10/09 0806) Pulse Rate: 110 (10/09 0806)  Labs:  Recent Labs  12/04/13 0936 12/04/13 1135 12/04/13 1643 12/04/13 1708 12/05/13 0550  HGB  --   --  8.7*  --  8.0*  HCT  --   --  25.7*  --  23.7*  PLT  --   --  243  --  243  LABPROT  --   --   --  21.3* 21.4*  INR  --  2.0  --  1.84* 1.86*  CREATININE 2.64*  --  2.67*  --  2.62*    Estimated Creatinine Clearance: 42.2 ml/min (by C-G formula based on Cr of 2.62).   Medical History: Past Medical History  Diagnosis Date  . Shingles < 2010  . HIV infection dx'd 10/2013  . Cellulitis 11/2013    LLE  . CAP (community acquired pneumonia) 10/2013  . DVT (deep venous thrombosis) 10/2013    BLE  . Multiple food allergies   . Multiple environmental allergies     "trees, dogs, cats"  . Chronic kidney disease   . 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  Bilateral DVT diagnosed during admission on 09/26, had imaging completed yesterday which still showed presence of acute bilateral DVTs. Current INR 1.86. Will start Lovenox while INR is subtherapeutic. Pt has AKI with SCr to 2.6 today and eCrCl ~40.  Hgb is low at 8 this morning, platelets  are stable.   PTA warfarin dose: 5 mg po daily  Goal of Therapy: Goal INR of 2-3 Monitor platelets while on anticoagulation    Plan:  -Warfarin 7.5 mg po x1 -Daily INR -Lovenox 80 mg [place] q12h -CBC q72h -Monitor hgb and renal fx closely, pt is borderline for requiring dose adjustment  -Dc Lovenox when INR therapeutic   Hughes Better, PharmD, BCPS Clinical Pharmacist Pager: [phone] 12/05/2013 10:50 AM

## 2013-12-06 ENCOUNTER — Inpatient Hospital Stay (HOSPITAL_COMMUNITY): Payer: Self-pay

## 2013-12-06 ENCOUNTER — Encounter (HOSPITAL_COMMUNITY): Payer: Self-pay

## 2013-12-06 DIAGNOSIS — A419 Sepsis, unspecified organism: Principal | ICD-10-CM

## 2013-12-06 DIAGNOSIS — M60004 Infective myositis, unspecified left leg: Secondary | ICD-10-CM

## 2013-12-06 DIAGNOSIS — E43 Unspecified severe protein-calorie malnutrition: Secondary | ICD-10-CM

## 2013-12-06 DIAGNOSIS — L03116 Cellulitis of left lower limb: Secondary | ICD-10-CM

## 2013-12-06 DIAGNOSIS — B349 Viral infection, unspecified: Secondary | ICD-10-CM

## 2013-12-06 DIAGNOSIS — I82409 Acute embolism and thrombosis of unspecified deep veins of unspecified lower extremity: Secondary | ICD-10-CM

## 2013-12-06 LAB — CBC WITH DIFFERENTIAL/PLATELET
Basophils Absolute: 0 10*3/uL (ref 0.0–0.1)
Basophils Relative: 0 % (ref 0–1)
Eosinophils Absolute: 0.1 10*3/uL (ref 0.0–0.7)
Eosinophils Relative: 1 % (ref 0–5)
HCT: 24.4 % — ABNORMAL LOW (ref 39.0–52.0)
Hemoglobin: 8.1 g/dL — ABNORMAL LOW (ref 13.0–17.0)
Lymphocytes Relative: 21 % (ref 12–46)
Lymphs Abs: 2.2 10*3/uL (ref 0.7–4.0)
MCH: 26 pg (ref 26.0–34.0)
MCHC: 33.2 g/dL (ref 30.0–36.0)
MCV: 78.2 fL (ref 78.0–100.0)
Monocytes Absolute: 1.1 10*3/uL — ABNORMAL HIGH (ref 0.1–1.0)
Monocytes Relative: 10 % (ref 3–12)
Neutro Abs: 7 10*3/uL (ref 1.7–7.7)
Neutrophils Relative %: 68 % (ref 43–77)
Platelets: 301 10*3/uL (ref 150–400)
RBC: 3.12 MIL/uL — ABNORMAL LOW (ref 4.22–5.81)
RDW: 15 % (ref 11.5–15.5)
WBC: 10.4 10*3/uL (ref 4.0–10.5)

## 2013-12-06 LAB — COMPREHENSIVE METABOLIC PANEL
ALT: 8 U/L (ref 0–53)
AST: 20 U/L (ref 0–37)
Albumin: 1.3 g/dL — ABNORMAL LOW (ref 3.5–5.2)
Alkaline Phosphatase: 71 U/L (ref 39–117)
Anion gap: 13 (ref 5–15)
BUN: 23 mg/dL (ref 6–23)
CO2: 21 mEq/L (ref 19–32)
Calcium: 8.4 mg/dL (ref 8.4–10.5)
Chloride: 97 mEq/L (ref 96–112)
Creatinine, Ser: 3.15 mg/dL — ABNORMAL HIGH (ref 0.50–1.35)
GFR calc Af Amer: 29 mL/min — ABNORMAL LOW (ref >90)
GFR calc non Af Amer: 25 mL/min — ABNORMAL LOW (ref >90)
Glucose, Bld: 103 mg/dL — ABNORMAL HIGH (ref 70–99)
Potassium: 3.7 mEq/L (ref 3.7–5.3)
Sodium: 131 mEq/L — ABNORMAL LOW (ref 137–147)
Total Bilirubin: 0.3 mg/dL (ref 0.3–1.2)
Total Protein: 7.7 g/dL (ref 6.0–8.3)

## 2013-12-06 LAB — URINE CULTURE
Colony Count: NO GROWTH
Culture: NO GROWTH

## 2013-12-06 LAB — PROTIME-INR
INR: 2.43 — ABNORMAL HIGH (ref 0.00–1.49)
Prothrombin Time: 26.4 seconds — ABNORMAL HIGH (ref 11.6–15.2)

## 2013-12-06 MED ORDER — FUROSEMIDE 40 MG PO TABS
40.0000 mg | ORAL_TABLET | Freq: Every day | ORAL | Status: DC
  Administered 2013-12-07: 40 mg via ORAL
  Filled 2013-12-06: qty 1

## 2013-12-06 MED ORDER — WARFARIN SODIUM 5 MG PO TABS
5.0000 mg | ORAL_TABLET | Freq: Once | ORAL | Status: AC
  Administered 2013-12-06: 5 mg via ORAL
  Filled 2013-12-06: qty 1

## 2013-12-06 MED ORDER — DAPSONE 100 MG PO TABS
100.0000 mg | ORAL_TABLET | Freq: Every day | ORAL | Status: DC
  Administered 2013-12-06 – 2013-12-08 (×3): 100 mg via ORAL
  Filled 2013-12-06 (×3): qty 1

## 2013-12-06 NOTE — Progress Notes (Signed)
Orthopedic Tech Progress Note Patient Details:  Jim Wyatt 1980/10/29 [id number] Set to specifics used with PT Ortho Devices Type of Ortho Device: Crutches Ortho Device/Splint Interventions: Ordered   [name] 12/06/2013, 4:50 PM

## 2013-12-06 NOTE — Progress Notes (Signed)
[facility] for [facility]ious [facility]    Subjective: No new complaints   Antibiotics:  Anti-infectives   Start     Dose/Rate Route Frequency Ordered Stop   12/08/13 1000  azithromycin (ZITHROMAX) tablet 1,200 mg     1,200 mg Oral Weekly 12/04/13 1844     12/06/13 1015  dapsone tablet 100 mg     100 mg Oral Daily 12/06/13 1012     12/05/13 1800  lamiVUDine (EPIVIR) tablet 150 mg     150 mg Oral Daily 12/05/13 1725     12/04/13 2200  abacavir (ZIAGEN) tablet 600 mg     600 mg Oral Daily 12/04/13 1844     12/04/13 2200  lamiVUDine (EPIVIR) 10 MG/ML solution 150 mg  Status:  Discontinued     150 mg Oral Daily 12/04/13 1844 12/05/13 1725   12/04/13 2200  raltegravir (ISENTRESS) tablet 400 mg     400 mg Oral 2 times daily 12/04/13 1844     12/04/13 2200  valGANciclovir (VALCYTE) 450 MG tablet TABS 450 mg     450 mg Oral Daily 12/04/13 1844     12/04/13 2200  cefTRIAXone (ROCEPHIN) 2 g in dextrose 5 % 50 mL IVPB  Status:  Discontinued     2 g 100 mL/hr over 30 Minutes Intravenous Every 24 hours 12/04/13 2043 12/05/13 1102   12/04/13 1800  vancomycin (VANCOCIN) IVPB 1000 mg/200 mL premix  Status:  Discontinued     1,000 mg 200 mL/hr over 60 Minutes Intravenous  Once 12/04/13 1759 12/04/13 1843      Medications: Scheduled Meds: . abacavir  600 mg Oral Daily  . [START ON 12/08/2013] azithromycin  1,200 mg Oral Weekly  . dapsone  100 mg Oral Daily  . enoxaparin (LOVENOX) injection  80 mg Subcutaneous Q12H  . feeding supplement (RESOURCE BREEZE)  1 Container Oral BID BM  . [START ON 12/07/2013] furosemide  40 mg Oral Daily  . lamiVUDine  150 mg Oral Daily  . morphine  60 mg Oral Q12H  . pantoprazole  40 mg Oral Q1200  . raltegravir  400 mg Oral BID  . valGANciclovir  450 mg Oral Daily  . warfarin  5 mg Oral ONCE-1800  . Warfarin - Pharmacist Dosing Inpatient   Does not apply q1800   Continuous Infusions:  PRN Meds:.acetaminophen, albuterol, HYDROcodone-acetaminophen,  HYDROmorphone (DILAUDID) injection    Objective: Weight change:  No intake or output data in the 24 hours ending 12/06/13 1637 Blood pressure 138/84, pulse 114, temperature 99 F (37.2 C), temperature source Oral, resp. rate 18, height '5\' 7"'  (1.702 m), weight 180 lb 8.9 oz (81.9 kg), SpO2 98.00%. Temp:  [98.4 F (36.9 C)-99.8 F (37.7 C)] 99 F (37.2 C) (10/10 1408) Pulse Rate:  [112-134] 114 (10/10 1408) Resp:  [18] 18 (10/10 1408) BP: (137-154)/(77-89) 138/84 mmHg (10/10 1408) SpO2:  [95 %-98 %] 98 % (10/10 1408)  Physical Exam: General: Alert and awake, oriented x3, not in any acute distress.  HEENT: anicteric sclera, pupils reactive to light and accommodation, EOMI, oropharynx clear and without exudate  CVS regular rate, normal r, no murmur rubs or gallops  Chest: clear to auscultation bilaterally, no wheezing, rales or rhonchi  Abdomen: soft nontender, nondistended, normal bowel sounds,  Extremities: Severe swelling of his left lower extremity  Neuro: nonfocal, strength and sensation intact   CBC:  Recent Labs Lab 12/02/13 1006 12/04/13 1135 12/04/13 1643 12/04/13 1708 12/05/13 0550 12/06/13 0348  HGB 8.4*  --  8.7*  --  8.0* 8.1*  HCT 25.7*  --  25.7*  --  23.7* 24.4*  PLT 294  --  243  --  243 301  INR 1.58* 2.0  --  1.84* 1.86* 2.43*  APTT 44*  --   --   --   --   --      BMET  Recent Labs  12/05/13 0550 12/06/13 0348  NA 130* 131*  K 3.6* 3.7  CL 100 97  CO2 18* 21  GLUCOSE 98 103*  BUN 18 23  CREATININE 2.62* 3.15*  CALCIUM 8.0* 8.4     Liver Panel   Recent Labs  12/05/13 0550 12/06/13 0348  PROT 7.1 7.7  ALBUMIN 1.2* 1.3*  AST 18 20  ALT 8 8  ALKPHOS 64 71  BILITOT 0.2* 0.3       Sedimentation Rate No results found for this basename: ESRSEDRATE,  in the last 72 hours C-Reactive Protein No results found for this basename: CRP,  in the last 72 hours  Micro Results: Recent Results (from the past 720 hour(s))  CULTURE,  BLOOD (ROUTINE X 2)     Status: None   Collection Time    11/07/13 12:23 AM      Result Value Ref Range Status   Specimen Description BLOOD RIGHT HAND   Final   Special Requests BOTTLES DRAWN AEROBIC AND ANAEROBIC 5CC   Final   Culture  Setup Time     Final   Value: 11/07/2013 04:48     Performed at [facility]   Culture     Final   Value: NO GROWTH 5 DAYS     Performed at [facility]   Report Status 11/13/2013 FINAL   Final  CULTURE, BLOOD (ROUTINE X 2)     Status: None   Collection Time    11/07/13 12:28 AM      Result Value Ref Range Status   Specimen Description BLOOD LEFT ANTECUBITAL   Final   Special Requests BOTTLES DRAWN AEROBIC AND ANAEROBIC 10CC   Final   Culture  Setup Time     Final   Value: 11/07/2013 04:48     Performed at [facility]   Culture     Final   Value: NO GROWTH 5 DAYS     Performed at [facility]   Report Status 11/13/2013 FINAL   Final  URINE CULTURE     Status: None   Collection Time    11/07/13  4:52 PM      Result Value Ref Range Status   Specimen Description URINE, RANDOM   Final   Special Requests NONE   Final   Culture  Setup Time     Final   Value: 11/07/2013 19:58     Performed at [facility]     Final   Value: NO GROWTH     Performed at [facility]   Culture     Final   Value: NO GROWTH     Performed at [facility]   Report Status 11/09/2013 FINAL   Final  GC/CHLAMYDIA PROBE AMP     Status: None   Collection Time    11/07/13  4:52 PM      Result Value Ref Range Status   CT Probe RNA NEGATIVE  NEGATIVE Final   GC Probe RNA NEGATIVE  NEGATIVE Final   Comment: (NOTE)                                                                                               **  Normal Reference Range: Negative**          Assay performed using the Gen-Probe APTIMA COMBO2 (R) Assay.     Acceptable specimen types for this assay include APTIMA Swabs (Unisex,     endocervical,  urethral, or vaginal), first void urine, and ThinPrep     liquid based cytology samples.     Performed at [facility] DIFFICILE BY PCR     Status: None   Collection Time    11/07/13 10:35 PM      Result Value Ref Range Status   C difficile by pcr NEGATIVE  NEGATIVE Final  RESPIRATORY VIRUS PANEL     Status: None   Collection Time    11/09/13  3:19 PM      Result Value Ref Range Status   Source - RVPAN NASAL SWAB   Corrected   Comment: CORRECTED ON 09/14 AT 2100: PREVIOUSLY REPORTED AS NASAL SWAB   Respiratory Syncytial Virus A NOT DETECTED   Final   Respiratory Syncytial Virus B NOT DETECTED   Final   Influenza A NOT DETECTED   Final   Influenza B NOT DETECTED   Final   Parainfluenza 1 NOT DETECTED   Final   Parainfluenza 2 NOT DETECTED   Final   Parainfluenza 3 NOT DETECTED   Final   Metapneumovirus NOT DETECTED   Final   Rhinovirus NOT DETECTED   Final   Adenovirus NOT DETECTED   Final   Influenza A H1 NOT DETECTED   Final   Influenza A H3 NOT DETECTED   Final   Comment: (NOTE)           Normal Reference Range for each Analyte: NOT DETECTED     Testing performed using the Luminex xTAG Respiratory Viral Panel test     kit.     The analytical performance characteristics of this assay have been     determined by [facility].  The modifications have not been     cleared or approved by the FDA. This assay has been validated pursuant     to the CLIA regulations and is used for clinical purposes.     Performed at [place], BLOOD (ROUTINE X 2)     Status: None   Collection Time    11/14/13 12:30 PM      Result Value Ref Range Status   Specimen Description BLOOD RIGHT ARM   Final   Special Requests BOTTLES DRAWN AEROBIC ONLY 10CC   Final   Culture  Setup Time     Final   Value: 11/14/2013 21:41     Performed at [facility]   Culture     Final   Value: NO GROWTH 5 DAYS     Performed at [facility]   Report  Status 11/20/2013 FINAL   Final  URINE CULTURE     Status: None   Collection Time    11/14/13  5:44 PM      Result Value Ref Range Status   Specimen Description URINE, RANDOM   Final   Special Requests Normal   Final   Culture  Setup Time     Final   Value: 11/14/2013 18:24     Performed at [facility]     Final   Value: NO GROWTH     Performed at [facility]   Culture     Final   Value:  NO GROWTH     Performed at [facility]   Report Status 11/15/2013 FINAL   Final  AFB CULTURE, BLOOD     Status: None   Collection Time    11/15/13  8:40 PM      Result Value Ref Range Status   Specimen Description BLOOD RIGHT ARM   Final   Special Requests 5CC AFB   Final   Culture     Final   Value: CULTURE WILL BE EXAMINED FOR 6 WEEKS BEFORE ISSUING A FINAL REPORT     Performed at [facility]   Report Status PENDING   Incomplete  CULTURE, BLOOD (SINGLE)     Status: None   Collection Time    11/22/13  9:30 PM      Result Value Ref Range Status   Specimen Description BLOOD RIGHT HAND   Final   Special Requests BOTTLES DRAWN AEROBIC AND ANAEROBIC 5CC EACH   Final   Culture  Setup Time     Final   Value: 11/23/2013 03:31     Performed at [facility]   Culture     Final   Value: NO GROWTH 5 DAYS     Performed at [facility]   Report Status 11/29/2013 FINAL   Final  CULTURE, BLOOD (SINGLE)     Status: None   Collection Time    11/23/13  9:50 AM      Result Value Ref Range Status   Specimen Description BLOOD RIGHT ARM   Final   Special Requests BOTTLES DRAWN AEROBIC AND ANAEROBIC 10CC   Final   Culture  Setup Time     Final   Value: 11/23/2013 16:40     Performed at [facility]   Culture     Final   Value: NO GROWTH 5 DAYS     Performed at [facility]   Report Status 11/29/2013 FINAL   Final  CULTURE, BLOOD (ROUTINE X 2)     Status: None   Collection Time    12/04/13  4:43 PM      Result Value Ref  Range Status   Specimen Description BLOOD ARM RIGHT   Final   Special Requests BOTTLES DRAWN AEROBIC AND ANAEROBIC 5CC   Final   Culture  Setup Time     Final   Value: 12/04/2013 23:08     Performed at [facility]   Culture     Final   Value:        BLOOD CULTURE RECEIVED NO GROWTH TO DATE CULTURE WILL BE HELD FOR 5 DAYS BEFORE ISSUING A FINAL NEGATIVE REPORT     Performed at [facility]   Report Status PENDING   Incomplete  CULTURE, BLOOD (ROUTINE X 2)     Status: None   Collection Time    12/04/13  4:45 PM      Result Value Ref Range Status   Specimen Description BLOOD FOREARM LEFT   Final   Special Requests BOTTLES DRAWN AEROBIC AND ANAEROBIC 5CC   Final   Culture  Setup Time     Final   Value: 12/04/2013 23:01     Performed at [facility]   Culture     Final   Value:        BLOOD CULTURE RECEIVED NO GROWTH TO DATE CULTURE WILL BE HELD FOR 5 DAYS BEFORE ISSUING A FINAL NEGATIVE REPORT     Performed at [facility]  Report Status PENDING   Incomplete  URINE CULTURE     Status: None   Collection Time    12/04/13  8:37 PM      Result Value Ref Range Status   Specimen Description URINE, CLEAN CATCH   Final   Special Requests NONE   Final   Culture  Setup Time     Final   Value: 12/05/2013 02:55     Performed at [facility]     Final   Value: NO GROWTH     Performed at [facility]   Culture     Final   Value: NO GROWTH     Performed at [facility]   Report Status 12/06/2013 FINAL   Final  AFB CULTURE, BLOOD     Status: None   Collection Time    12/05/13  6:42 PM      Result Value Ref Range Status   Specimen Description BLOOD RIGHT ARM   Final   Special Requests 5CC   Final   Culture     Final   Value: CULTURE WILL BE EXAMINED FOR 6 WEEKS BEFORE ISSUING A FINAL REPORT     Performed at [facility]   Report Status PENDING   Incomplete    Studies/Results: Ct Tibia Fibula Left Wo  Contrast  12/06/2013   CLINICAL DATA:  Severe left leg pain and swelling.  EXAM: CT TIBIA FIBULA LEFT WITHOUT CONTRAST  TECHNIQUE: Multidetector CT imaging was performed according to the standard protocol. Multiplanar CT image reconstructions were also generated.  COMPARISON:  None.  FINDINGS: There is diffuse subcutaneous soft tissue swelling/ edema and fluid suggesting cellulitis. I suspect there is also diffuse myofasciitis. No definite CT findings to suggest septic arthritis or osteomyelitis. A small knee joint effusion is noted.  IMPRESSION: Diffuse cellulitis and myofasciitis without definite findings for septic arthritis or osteomyelitis.   Electronically Signed   By: Kalman Jewels M.D.   On: 12/06/2013 13:28   Dg Chest Port 1 View  (if Code Sepsis Called)  12/04/2013   CLINICAL DATA:  Fever.  Recent pneumonia.  Left leg pain.  EXAM: PORTABLE CHEST - 1 VIEW  COMPARISON:  Radiographs dated 11/23/2013 and 11/19/2013 and CT scan of the chest dated 11/07/2013  FINDINGS: Heart size and pulmonary vascularity are normal and the lungs are clear. No osseous abnormality. No effusions.  IMPRESSION: Normal chest.   Electronically Signed   By: Rozetta Nunnery M.D.   On: 12/04/2013 16:56      Assessment/Plan:  Active Problems:   Sepsis   Protein-calorie malnutrition, severe    Jim Wyatt is a 33 y.o. 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 He is still on valcyte for possible CMV infection (he had CMV viremia but I do not know that he had proven end organ infection)   #1 Fevers:  --ok to check AFB blood cultures but I expect his fevers are due to either his HIV itself, IRIS and or his DVT   #2 HIV; continue ARV with epivir increased to 141m tablet and needs PCP prophylaxis with  dapsone   #3 FSGS/ATN HIVAN; renal fxn much improved on admission cr still 3.15  #4 DVT: on anticoagulations.   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 Would defer whether or not to continue or stop his medication to his outpatient infectious disease provider.  I expect he should be stable for discharge once his anticoagulation has been worked out.  NOTE HE DOES ALREADY HAVE ADAP APPROVAL AND MEDS BEIGN SENT BY [facility]  HE SHOULD HAVE NEW SCRIPT FOR 150 MG TABLET OF EPIVIR SENT IN TO TAKE PLACE OF LIQUID EPIVIR  HE SHOULD HAVE DAPSONE 100MG SCRIPT SENT IN  HE CURRENTLY HAS APPT WITH DR. HATCHER ON 12/09/13    I will sign off for now  Please call with further questions.     LOS: 2 days   Alcide Evener 12/06/2013, 4:37 PM

## 2013-12-06 NOTE — Progress Notes (Signed)
[facility] PROGRESS NOTE  Jim Wyatt [id number] DOB: 28-Nov-1980 DOA: 12/04/2013 PCP: [name], MD  Assessment/Plan: 1. Fever/SIRS -suspect could be related to DVT in LLE and associated inflammation vs HIV vs IRIS -I do not see evidence of cellulitis, CXR/UA unremarkable -FU Blood Cx, appreciate ID consult -ARB cultures ordered -last admission he was treated empirically for PCP pneumonia for which he completed the 21day course  2. 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 HIV/AIDs -diagnosed in 9/15 -on HAARt regimen, which was changed since discharge, per pt due to cost -emergency ADAP application was initiated per Dr.Hatcher piror to discharge  4. AKI -due to ATN and FSGS/ HIVAN, s/p Renal Biopsy -creatinine suspect ATN part improving -due to FU with Dr.Sanford  -creatinine 3 today, will cut down lasix dose  5. Asthma -stable  6. 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  Code Status: Full Code Family Communication: none at bedside Disposition Plan: home when stable   Consultants:  ID  HPI/Subjective: Reports L leg pain  About the same, denies any change in swelling although it appears better to me  Objective: Filed Vitals:   12/06/13 0720  BP: 152/89  Pulse:   Temp:   Resp:    No intake or output data in the 24 hours ending 12/06/13 1058 Filed Weights   12/04/13 1734 12/04/13 2025  Weight: 84.8 kg (186 lb 15.2  oz) 81.9 kg (180 lb 8.9 oz)    Exam:   General:  AAOx3, no distress  Cardiovascular: S1S2/RRR  Respiratory: CTAB  Abdomen: soft, NT, BS present  Musculoskeletal: 2-3plus edema LLE, 1plus in RLE  Data Reviewed: Basic Metabolic Panel:  Recent Labs Lab 12/02/13 1006 12/04/13 0936 12/04/13 1643 12/04/13 1943 12/05/13 0550 12/06/13 0348  NA 136* 133* 133*  --  130* 131*  K 3.1* 3.4* 3.4*  --  3.6* 3.7  CL 103 103 101  --  100 97  CO2 20 18* 19  --  18* 21  GLUCOSE 125* 116* 111*  --  98 103*  BUN 20 19 20   --  18 23  CREATININE 3.09* 2.64* 2.67*  --  2.62* 3.15*  CALCIUM 8.3* 7.9* 8.0*  --  8.0* 8.4  MG  --   --   --  1.6  --   --    Liver Function Tests:  Recent Labs Lab 12/04/13 0936 12/04/13 1643 12/05/13 0550 12/06/13 0348  AST 19 21 18 20   ALT 9 10 8 8   ALKPHOS 60 68 64 71  BILITOT 0.4 0.3 0.2* 0.3  PROT 6.6 7.6 7.1 7.7  ALBUMIN 2.0* 1.3* 1.2* 1.3*   No results found for this basename: LIPASE, AMYLASE,  in the last 168 hours No results found for this basename: AMMONIA,  in the last 168 hours CBC:  Recent Labs Lab 12/02/13 1006 12/04/13 1643 12/05/13 0550 12/06/13 0348  WBC 8.2 8.8 10.6* 10.4  NEUTROABS 5.7 6.6 7.0 7.0  HGB 8.4* 8.7* 8.0* 8.1*  HCT 25.7* 25.7* 23.7*  24.4*  MCV 78.4 78.4 80.3 78.2  PLT 294 243 243 301   Cardiac Enzymes: No results found for this basename: CKTOTAL, CKMB, CKMBINDEX, TROPONINI,  in the last 168 hours BNP (last 3 results)  Recent Labs  11/07/13 0855  PROBNP 33.5   CBG: No results found for this basename: GLUCAP,  in the last 168 hours  Recent Results (from the past 240 hour(s))  CULTURE, BLOOD (ROUTINE X 2)     Status: None   Collection Time    12/04/13  4:43 PM      Result Value Ref Range Status   Specimen Description BLOOD ARM RIGHT   Final   Special Requests BOTTLES DRAWN AEROBIC AND ANAEROBIC 5CC   Final   Culture  Setup Time     Final   Value: 12/04/2013 23:08     Performed at [facility]   Culture     Final   Value:        BLOOD CULTURE RECEIVED NO GROWTH TO DATE CULTURE WILL BE HELD FOR 5 DAYS BEFORE ISSUING A FINAL NEGATIVE REPORT     Performed at [facility]   Report Status PENDING   Incomplete  CULTURE, BLOOD (ROUTINE X 2)     Status: None   Collection Time    12/04/13  4:45 PM      Result Value Ref Range Status   Specimen Description BLOOD FOREARM LEFT   Final   Special Requests BOTTLES DRAWN AEROBIC AND ANAEROBIC 5CC   Final   Culture  Setup Time     Final   Value: 12/04/2013 23:01     Performed at [facility]   Culture     Final   Value:        BLOOD CULTURE RECEIVED NO GROWTH TO DATE CULTURE WILL BE HELD FOR 5 DAYS BEFORE ISSUING A FINAL NEGATIVE REPORT     Performed at [facility]   Report Status PENDING   Incomplete  URINE CULTURE     Status: None   Collection Time    12/04/13  8:37 PM      Result Value Ref Range Status   Specimen Description URINE, CLEAN CATCH   Final   Special Requests NONE   Final   Culture  Setup Time     Final   Value: 12/05/2013 02:55     Performed at [place]     Final   Value: NO GROWTH     Performed at [facility]   Culture     Final   Value: NO GROWTH     Performed at [facility]   Report Status 12/06/2013 FINAL   Final     Studies: Dg Chest Port 1 View  (if Code Sepsis Called)  12/04/2013   CLINICAL DATA:  Fever.  Recent pneumonia.  Left leg pain.  EXAM: PORTABLE CHEST - 1 VIEW  COMPARISON:  Radiographs dated 11/23/2013 and 11/19/2013 and CT scan of the chest dated 11/07/2013  FINDINGS: Heart size and pulmonary vascularity are normal and the lungs are clear. No osseous abnormality. No effusions.  IMPRESSION: Normal chest.   Electronically Signed   By: Rozetta Nunnery M.D.   On: 12/04/2013 16:56    Scheduled Meds: . abacavir  600 mg Oral Daily  . [START ON 12/08/2013] azithromycin  1,200 mg Oral Weekly  . dapsone  100 mg Oral Daily  .  enoxaparin (LOVENOX) injection  80 mg Subcutaneous Q12H  . feeding supplement (RESOURCE BREEZE)  1 Container Oral BID BM  . [START ON 12/07/2013] furosemide  40 mg Oral Daily  . lamiVUDine  150 mg Oral Daily  . morphine  60 mg Oral Q12H  . pantoprazole  40 mg Oral Q1200  . raltegravir  400 mg Oral BID  . valGANciclovir  450 mg Oral Daily  . warfarin  5 mg Oral ONCE-1800  . Warfarin - Pharmacist Dosing Inpatient   Does not apply q1800   Continuous Infusions:  Antibiotics Given (last 72 hours)   Date/Time Action Medication Dose Rate   12/04/13 2133 Given   abacavir (ZIAGEN) tablet 600 mg 600 mg    12/04/13 2133 Given   raltegravir (ISENTRESS) tablet 400 mg 400 mg    12/04/13 2247 Given   lamiVUDine (EPIVIR) 10 MG/ML solution 150 mg 150 mg    12/04/13 2247 Given   valGANciclovir (VALCYTE) 450 MG tablet TABS 450 mg 450 mg    12/04/13 2247 Given   cefTRIAXone (ROCEPHIN) 2 g in dextrose 5 % 50 mL IVPB 2 g 100 mL/hr   12/05/13 0920 Given   abacavir (ZIAGEN) tablet 600 mg 600 mg    12/05/13 0920 Given   lamiVUDine (EPIVIR) 10 MG/ML solution 150 mg 150 mg    12/05/13 0920 Given   raltegravir (ISENTRESS) tablet 400 mg 400 mg    12/05/13 0920 Given   valGANciclovir (VALCYTE) 450 MG tablet TABS 450 mg 450 mg    12/05/13 2250 Given   raltegravir (ISENTRESS) tablet 400 mg 400 mg    12/06/13 1030 Given   abacavir (ZIAGEN) tablet 600 mg 600 mg    12/06/13 1030 Given   raltegravir (ISENTRESS) tablet 400 mg 400 mg    12/06/13 1030 Given   valGANciclovir (VALCYTE) 450 MG tablet TABS 450 mg 450 mg    12/06/13 1030 Given   lamiVUDine (EPIVIR) tablet 150 mg 150 mg       Active Problems:   Sepsis    Time spent: 42min    [facility] Pager [phone]. If 7PM-7AM, please contact night-coverage at [email or web address], password [facility] 12/06/2013, 10:58 AM  LOS: 2 days

## 2013-12-06 NOTE — Progress Notes (Signed)
[place] for warfarin/Lovenox Indication: DVT  Allergies  Allergen Reactions  . Augmentin [Amoxicillin-Pot Clavulanate] Other (See Comments)    unknown  . Bactrim [Sulfamethoxazole-Tmp Ds]     Renal failure  . Banana   . Cauliflower [Brassica Oleracea Italica] Other (See Comments)    Pt states his throat closes up  . Fish Allergy Hives  . Other     Patient has multiple food allergies, says he is allergic to corn, but not corn containing products   . Vancomycin     Patient states vancomycin caused kidney injury  . Onion Rash   Patient Measurements: Height: 5\' 7"  (170.2 cm) Weight: 180 lb 8.9 oz (81.9 kg) IBW/kg (Calculated) : 66.1  Vital Signs: Temp: 99.8 F (37.7 C) (10/10 0628) Temp Source: Oral (10/10 0628) BP: 152/89 mmHg (10/10 0720) Pulse Rate: 112 (10/10 0628)  Labs:  Recent Labs  12/04/13 1643 12/04/13 1708 12/05/13 0550 12/06/13 0348  HGB 8.7*  --  8.0* 8.1*  HCT 25.7*  --  23.7* 24.4*  PLT 243  --  243 301  LABPROT  --  21.3* 21.4* 26.4*  INR  --  1.84* 1.86* 2.43*  CREATININE 2.67*  --  2.62* 3.15*    Estimated Creatinine Clearance: 35.1 ml/min (by C-G formula based on Cr of 3.15).   Medical History: Past Medical History  Diagnosis Date  . Shingles < 2010  . HIV infection dx'd 10/2013  . Cellulitis 11/2013    LLE  . CAP (community acquired pneumonia) 10/2013  . DVT (deep venous thrombosis) 10/2013    BLE  . Multiple food allergies   . Multiple environmental allergies     "trees, dogs, cats"  . Chronic kidney disease   . 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  Bilateral DVT diagnosed during admission on 9/26, had imaging completed 10/8 which still showed presence of acute bilateral DVTs. Baseline INR subtherapeutic. Lovenox started until INR therapeutic x2. INR now therapeutic 1.86>>2.43 today x1. Will recheck in AM. Pt has AKI with SCr bumped again  2.62>>3.15 today and eCrCl ~35.  Hgb low stable 8.1, platelets wnl.   PTA warfarin dose: 5 mg po daily  Goal of Therapy: Goal INR of 2-3 Monitor platelets while on anticoagulation   Plan:  -Warfarin 5 mg po x1 -Daily PT/INR -Lovenox 80 mg [place] q12h -CBC q72h -Monitor hgb and renal fx closely -Dc Lovenox when INR therapeutic  Elicia Lamp, PharmD Clinical Pharmacist - Resident Pager [phone] 12/06/2013 10:18 AM

## 2013-12-06 NOTE — Evaluation (Signed)
Physical Therapy Evaluation Patient Details Name: Jim Wyatt MRN: [id number] DOB: Jan 07, 1981 Today's Date: 12/06/2013   History of Present Illness  Patient is a 33 yo male admitted 12/04/13 with LLE edema/pain, sepsis.  Patient with recent admission with BLE DVT's.  PMH:  CKD, HIV, asthma, DVT  Clinical Impression  Patient presents with pain/edema LLE impacting functional mobility.  Instructed patient with crutch ambulation.  Will need to be at mod I level to return home.  Will benefit from acute PT to achieve this level of function prior to discharge.    Follow Up Recommendations No PT follow up;Supervision - Intermittent    Equipment Recommendations  Crutches    Recommendations for Other Services       Precautions / Restrictions Precautions Precautions: None Restrictions Weight Bearing Restrictions: No      Mobility  Bed Mobility Overal bed mobility: Independent                Transfers Overall transfer level: Modified independent Equipment used: Crutches             General transfer comment: Instructed patient on proper use of crutches during transfers.  Able to perform safely.  Ambulation/Gait Ambulation/Gait assistance: Supervision Ambulation Distance (Feet): 250 Feet Assistive device: Crutches Gait Pattern/deviations: Step-to pattern;Decreased stride length;Decreased weight shift to left;Antalgic Gait velocity: Decreased Gait velocity interpretation: Below normal speed for age/gender General Gait Details: Instructed patient on safe use of crutches.  Patient unable to bear weight on LLE, so used a NWB gait pattern (swing to).  Instructed patient to attempt to put LLE on ground to use a more normal gait sequence once pain subsides.  Stairs            Wheelchair Mobility    Modified Rankin (Stroke Patients Only)       Balance Overall balance assessment: Needs assistance         Standing balance support: Single extremity  supported Standing balance-Leahy Scale: Fair Standing balance comment: Good balance with use of crutches                             Pertinent Vitals/Pain Pain Assessment: 0-10 Pain Score: 10-Worst pain ever Pain Location: Lt LE Pain Descriptors / Indicators: Aching;Throbbing Pain Intervention(s): Limited activity within patient's tolerance;Repositioned    Home Living Family/patient expects to be discharged to:: Private residence Living Arrangements: Alone Available Help at Discharge: Friend(s);Available PRN/intermittently Type of Home: House Home Access: Stairs to enter Entrance Stairs-Rails: None Entrance Stairs-Number of Steps: 1 Home Layout: One level Home Equipment: Cane - single point      Prior Function Level of Independence: Independent with assistive device(s)         Comments: Using cane pta due to pain in LE's.  Otherwise independent pta.     Hand Dominance        Extremity/Trunk Assessment   Upper Extremity Assessment: Overall WFL for tasks assessed           Lower Extremity Assessment: RLE deficits/detail RLE Deficits / Details: Significant edema in LLE.  Painful.  Able to move against gravity    Cervical / Trunk Assessment: Normal  Communication   Communication: No difficulties  Cognition Arousal/Alertness: Awake/alert Behavior During Therapy: WFL for tasks assessed/performed Overall Cognitive Status: Within Functional Limits for tasks assessed                      General Comments  Exercises        Assessment/Plan    PT Assessment Patient needs continued PT services  PT Diagnosis Abnormality of gait;Acute pain   PT Problem List Decreased strength;Decreased range of motion;Decreased balance;Decreased mobility;Decreased knowledge of use of DME;Pain  PT Treatment Interventions DME instruction;Gait training;Stair training;Functional mobility training;Therapeutic activities;Patient/family education   PT Goals  (Current goals can be found in the Care Plan section) Acute Rehab PT Goals Patient Stated Goal: To decrease pain PT Goal Formulation: With patient Time For Goal Achievement: 12/13/13 Potential to Achieve Goals: Good    Frequency Min 3X/week   Barriers to discharge Decreased caregiver support Lives alone per patient.  Has friends that can check on him prn.    Co-evaluation               End of Session Equipment Utilized During Treatment: Gait belt Activity Tolerance: Patient limited by pain Patient left: in bed;with call bell/phone within reach (LLE elevated) Nurse Communication: Mobility status (Need for crutches - RN paging MD for order)         [id number] PT Time Calculation (min): 26 min   Charges:   PT Evaluation $Initial PT Evaluation Tier I: 1 Procedure PT Treatments $Gait Training: 8-22 mins   PT G CodesDespina Pole 12/06/2013, 4:07 PM Carita Pian. Sanjuana Kava, [place] Pager [phone]

## 2013-12-06 NOTE — Progress Notes (Signed)
PT Cancellation Note  Patient Details Name: Jim Wyatt MRN: [id number] DOB: Mar 28, 1980   Cancelled Treatment:    Reason Eval/Treat Not Completed: Pain limiting ability to participate.  Patient declining PT at this time due to pain.  Will attempt to return today as time allows.   Despina Pole 12/06/2013, 10:54 AM Carita Pian. Sanjuana Kava, [place] Pager [phone]

## 2013-12-06 NOTE — ED Provider Notes (Signed)
Medical screening examination/treatment/procedure(s) were performed by non-physician practitioner and as supervising physician I was immediately available for consultation/collaboration.    Dot Lanes, MD 12/06/13 2025

## 2013-12-07 DIAGNOSIS — J4520 Mild intermittent asthma, uncomplicated: Secondary | ICD-10-CM

## 2013-12-07 DIAGNOSIS — N179 Acute kidney failure, unspecified: Secondary | ICD-10-CM

## 2013-12-07 DIAGNOSIS — M7989 Other specified soft tissue disorders: Secondary | ICD-10-CM | POA: Diagnosis present

## 2013-12-07 DIAGNOSIS — B259 Cytomegaloviral disease, unspecified: Secondary | ICD-10-CM | POA: Diagnosis present

## 2013-12-07 LAB — CBC WITH DIFFERENTIAL/PLATELET
Basophils Absolute: 0 10*3/uL (ref 0.0–0.1)
Basophils Relative: 0 % (ref 0–1)
Eosinophils Absolute: 0.2 10*3/uL (ref 0.0–0.7)
Eosinophils Relative: 2 % (ref 0–5)
HCT: 22.5 % — ABNORMAL LOW (ref 39.0–52.0)
Hemoglobin: 7.6 g/dL — ABNORMAL LOW (ref 13.0–17.0)
Lymphocytes Relative: 18 % (ref 12–46)
Lymphs Abs: 1.5 10*3/uL (ref 0.7–4.0)
MCH: 26.1 pg (ref 26.0–34.0)
MCHC: 33.8 g/dL (ref 30.0–36.0)
MCV: 77.3 fL — ABNORMAL LOW (ref 78.0–100.0)
Monocytes Absolute: 0.8 10*3/uL (ref 0.1–1.0)
Monocytes Relative: 10 % (ref 3–12)
Neutro Abs: 6.1 10*3/uL (ref 1.7–7.7)
Neutrophils Relative %: 70 % (ref 43–77)
Platelets: 281 10*3/uL (ref 150–400)
RBC: 2.91 MIL/uL — ABNORMAL LOW (ref 4.22–5.81)
RDW: 14.8 % (ref 11.5–15.5)
WBC: 8.6 10*3/uL (ref 4.0–10.5)

## 2013-12-07 LAB — COMPREHENSIVE METABOLIC PANEL
ALT: 9 U/L (ref 0–53)
AST: 23 U/L (ref 0–37)
Albumin: 1.2 g/dL — ABNORMAL LOW (ref 3.5–5.2)
Alkaline Phosphatase: 66 U/L (ref 39–117)
Anion gap: 14 (ref 5–15)
BUN: 25 mg/dL — ABNORMAL HIGH (ref 6–23)
CO2: 20 mEq/L (ref 19–32)
Calcium: 8.2 mg/dL — ABNORMAL LOW (ref 8.4–10.5)
Chloride: 95 mEq/L — ABNORMAL LOW (ref 96–112)
Creatinine, Ser: 3.08 mg/dL — ABNORMAL HIGH (ref 0.50–1.35)
GFR calc Af Amer: 30 mL/min — ABNORMAL LOW (ref >90)
GFR calc non Af Amer: 26 mL/min — ABNORMAL LOW (ref >90)
Glucose, Bld: 107 mg/dL — ABNORMAL HIGH (ref 70–99)
Potassium: 3.5 mEq/L — ABNORMAL LOW (ref 3.7–5.3)
Sodium: 129 mEq/L — ABNORMAL LOW (ref 137–147)
Total Bilirubin: <0.2 mg/dL — ABNORMAL LOW (ref 0.3–1.2)
Total Protein: 7.3 g/dL (ref 6.0–8.3)

## 2013-12-07 LAB — PROTIME-INR
INR: 2.67 — ABNORMAL HIGH (ref 0.00–1.49)
Prothrombin Time: 28.4 seconds — ABNORMAL HIGH (ref 11.6–15.2)

## 2013-12-07 MED ORDER — WARFARIN SODIUM 5 MG PO TABS
5.0000 mg | ORAL_TABLET | Freq: Once | ORAL | Status: AC
  Administered 2013-12-07: 5 mg via ORAL
  Filled 2013-12-07: qty 1

## 2013-12-07 MED ORDER — FUROSEMIDE 40 MG PO TABS
40.0000 mg | ORAL_TABLET | Freq: Two times a day (BID) | ORAL | Status: DC
  Administered 2013-12-07 – 2013-12-08 (×2): 40 mg via ORAL
  Filled 2013-12-07 (×4): qty 1

## 2013-12-07 MED ORDER — DAPSONE 100 MG PO TABS: 100.0000 mg | ORAL_TABLET | Freq: Every day | ORAL | Status: DC

## 2013-12-07 MED ORDER — LAMIVUDINE 150 MG PO TABS: 150.0000 mg | ORAL_TABLET | Freq: Every day | ORAL | Status: DC

## 2013-12-07 MED ORDER — WARFARIN SODIUM 4 MG PO TABS
4.0000 mg | ORAL_TABLET | Freq: Once | ORAL | Status: DC
  Filled 2013-12-07: qty 1

## 2013-12-07 NOTE — Progress Notes (Signed)
[address] for warfarin/Lovenox Indication: DVT  Allergies  Allergen Reactions  . Augmentin [Amoxicillin-Pot Clavulanate] Other (See Comments)    unknown  . Bactrim [Sulfamethoxazole-Tmp Ds]     Renal failure  . Banana   . Cauliflower [Brassica Oleracea Italica] Other (See Comments)    Pt states his throat closes up  . Fish Allergy Hives  . Vancomycin     Patient states vancomycin caused kidney injury  . Onion Rash   Patient Measurements: Height: 5\' 7"  (170.2 cm) Weight: 180 lb 8.9 oz (81.9 kg) IBW/kg (Calculated) : 66.1  Vital Signs: Temp: 98.6 F (37 C) (10/11 0636) Temp Source: Oral (10/11 0636) BP: 133/82 mmHg (10/11 0636) Pulse Rate: 108 (10/11 0636)  Labs:  Recent Labs  12/05/13 0550 12/06/13 0348 12/07/13 0520  HGB 8.0* 8.1* 7.6*  HCT 23.7* 24.4* 22.5*  PLT 243 301 281  LABPROT 21.4* 26.4* 28.4*  INR 1.86* 2.43* 2.67*  CREATININE 2.62* 3.15* 3.08*    Estimated Creatinine Clearance: 35.9 ml/min (by C-G formula based on Cr of 3.08).   Medical History: Past Medical History  Diagnosis Date  . Shingles < 2010  . HIV infection dx'd 10/2013  . Cellulitis 11/2013    LLE  . CAP (community acquired pneumonia) 10/2013  . DVT (deep venous thrombosis) 10/2013    BLE  . Multiple food allergies   . Multiple environmental allergies     "trees, dogs, cats"  . Chronic kidney disease   . 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  Bilateral DVT diagnosed during admission on 9/26, had imaging completed 10/8 which still showed presence of acute bilateral DVTs. Baseline INR subtherapeutic- Lovenox started and now d/c'd. INR now therapeutic 2.43>>2.67 today x2. Pt has AKI with SCr slightly decr to 3.08 today and CrCl ~35.  Hgb decreased 7.6, platelets wnl. No bleeding documented  PTA warfarin dose: 5 mg po daily  Goal of Therapy: Goal INR of 2-3 Monitor platelets while on  anticoagulation   Plan:  -Warfarin 5 mg po x1 -Daily PT/INR -d/c lovenox -Mon s/sx bleeding  Elicia Lamp, PharmD Clinical Pharmacist - Resident Pager [phone] 12/07/2013 8:28 AM

## 2013-12-07 NOTE — Progress Notes (Signed)
PT Cancellation Note  Patient Details Name: Jim Wyatt MRN: [id number] DOB: May 25, 1980   Cancelled Treatment:    Reason Eval/Treat Not Completed: Pain limiting ability to participate.  Attempted to see patient x2 today.  Patient refused on both attempts (pain, wanting compression stockings).  Will return tomorrow for PT session.   Despina Pole 12/07/2013, 2:48 PM Carita Pian. Sanjuana Kava, [place] Pager [phone]

## 2013-12-07 NOTE — Progress Notes (Addendum)
Physician Discharge Summary  Jim Wyatt [id number] DOB: Jul 17, 1980 DOA: 12/04/2013  PCP: [name], MD  Admit date: 12/04/2013 Discharge date: 12/07/2013  Time spent: 45 minutes  Recommendations for Outpatient Follow-up:  1. Dr. Johnnye Sima 10/13 at [facility], AFB cultures pending 2. Dr.Sanford with [facility] 10/14 3. Dr. 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 Pt noted to have been admitted 9/11-9/29 for CAP/PCP PNA with secondary diagnoses of AKI And DVT. Pt was discharged on coumadin. LE U/S 9/26 showed bilateral LE DVT present-predominantly in post tibial veins bilaterally. Pt states that he has been compliant to coumadin use at home. Denies eating any green/leafy vegetables. Was bridged off of coumadin out pt. Reports progressive worsening pain over the past 1-2 days. Pain poorly controlled on outpt pain regimen. Per mom, pt spike fever 103 at home today. Was discharged on course of clindamycin and primaquine per ID recs. Completed 3-4 days ago. Denies any CP, SOB.  Presented to ER MAXIMUM TEMPERATURE 103, heart rate in the 100s 20/20, respirations in the tens, blood pressure in the 130s to 140s over 90s, satting  greater than 95% on room air.significant laboratories include white blood cell count 8.8, hemoglobin 8.7, potassium 3.4, creatinine of 2.67, calcium of 8, INR 1.4.lites are within normal limits at 1.1. CD4 count and UA pending. Preliminary LE u/s in ER shows persistence of bilateral LE DVTs with no reports of extension  Hospital Course:  1. Fever/SIRS -felt to be related to DVT in LLE and associated inflammation vs HIV vs IRIS  -myself and ID Dr.Vandam did not see evidence of cellulitis clinically, CXR/UA unremarkable  -FU Blood Cx negative thus far, appreciate ID consult  -ARB cultures ordered and pending -last admission he was treated empirically for PCP pneumonia for which he completed the 21day course   2. DVT bilateral with worsening of LLE swelling/post phlebitic syndrome  -Venous duplex 10/8 noted, persistent DVT bilaterally, originally diagnosed 9/26  -Suspect worsened when INR subtherapeutic  -When lovenox was stopped as outpatient his INR was 1.9, he was discharged on Lovenox/warfarin bridge on 9/29 with INR of 1.6  -NOACs not an option given CKD  -bridged with lovenox again while INR subtherapeutic, INR 2.4 today, bridged with lovenox for 2 days. -CT of left tib-fib with soft tissue swelling, which cannot be differentiated from cellulitis and ? Myofascitis on report. -Again myself and ID Dr.VanDam didn't think he had evidence of infection in his LLE. -I called and reviewed with Radiologist again who was not impressed with report of ? Myofascitis, he noted intact fat and fascial planes and noted soft tissue swelling. -pain control, physical therapy, elevation and Ted hose  -and continued on anticoagulation with coumadin  3. HIV/AIDs  -diagnosed in 9/15  -on HAARt regimen, which was changed since discharge, per pt due to cost  -emergency ADAP application was initiated per Dr.Hatcher piror to discharge  -Fu with Dr.Hatcher 10/13, started on Dapsone for prophylaxis per  Dr.VanDam   4. AKI  -due to ATN and FSGS/ HIVAN, s/p Renal Biopsy  -creatinine suspect ATN part improving  -due to FU with Dr.Sanford 10/14 -creatinine stable, continue lasix   5. Asthma  -stable   6. Anemia  -due to chronic disease and Iron defi  -will need Erythropoetin as outpatient  7. Positive CMV  -on valcyte   Consultations:  ID Dr.VanDam  Discharge Exam: Filed Vitals:   12/07/13 1414  BP: 151/82  Pulse: 108  Temp: 98.3 F (36.8 C)  Resp: 16    General: AAOx3 Cardiovascular: S1S2/RRR Respiratory:CTAB  Discharge Instructions You were cared for by a hospitalist during your hospital stay. If you have any questions about your discharge medications or the care you received while you were in the hospital after you are discharged, you can call the unit and asked to speak with the hospitalist on call if the hospitalist that took care of you is not available. Once you are discharged, your primary care physician will handle any further medical issues. Please note that NO REFILLS for any discharge medications will be authorized once you are discharged, as it is imperative that you return to your primary care physician (or establish a relationship with a primary care physician if you do not have one) for your aftercare needs so that they can reassess your need for medications and monitor your lab values.  Discharge Instructions   Diet - low sodium heart healthy    Complete by:  As directed      Increase activity slowly    Complete by:  As directed           Current Discharge Medication List    START taking these medications   Details  dapsone 100 MG tablet Take 1 tablet (100 mg total) by mouth daily. Qty: 30 tablet, Refills: 0    lamiVUDine (EPIVIR) 150 MG tablet Take 1 tablet (150 mg total) by mouth daily. Qty: 30 tablet, Refills: 0      CONTINUE these medications which have NOT CHANGED   Details  abacavir (ZIAGEN) 300 MG tablet Take 2 tablets (600 mg total)  by mouth daily. Qty: 60 tablet, Refills: 0   Associated Diagnoses: Acquired immune deficiency syndrome    albuterol (PROVENTIL HFA;VENTOLIN HFA) 108 (90 BASE) MCG/ACT inhaler Inhale 2 puffs into the lungs every 6 (six) hours as needed for wheezing or shortness of breath.    azithromycin (ZITHROMAX) 600 MG tablet Take 2 tablets (1,200 mg total) by mouth once a week. Qty: 16 tablet, Refills: 0   Associated Diagnoses: Acquired immune deficiency syndrome    furosemide (LASIX) 40 MG tablet Take 1 tablet (40 mg total) by mouth 2 (two) times daily. Qty: 60 tablet, Refills: 0    HYDROcodone-acetaminophen (NORCO/VICODIN) 5-325 MG per tablet Take 1-2 tablets by mouth every 4 (four) hours as needed for moderate pain. Qty: 30 tablet, Refills: 0    morphine (MS CONTIN) 60 MG 12 hr tablet Take 1 tablet (60 mg total) by mouth every 12 (twelve) hours. Qty: 60 tablet, Refills: 0    pantoprazole (PROTONIX) 40 MG tablet Take 1 tablet (40 mg total) by mouth daily at 12 noon. Qty: 30 tablet, Refills: 0    raltegravir (ISENTRESS) 400  MG tablet Take 1 tablet (400 mg total) by mouth 2 (two) times daily. Qty: 180 tablet, Refills: 3    warfarin (COUMADIN) 5 MG tablet Take 5 mg by mouth daily at 6 PM.     valGANciclovir (VALCYTE) 450 MG tablet Take 1 tablet (450 mg total) by mouth every other day. Qty: 120 tablet, Refills: 3   Associated Diagnoses: Acquired immune deficiency syndrome      STOP taking these medications     clindamycin (CLEOCIN) 300 MG capsule      lamiVUDine (EPIVIR) 10 MG/ML solution        Allergies  Allergen Reactions  . Augmentin [Amoxicillin-Pot Clavulanate] Other (See Comments)    unknown  . Bactrim [Sulfamethoxazole-Tmp Ds]     Renal failure  . Banana   . Cauliflower [Brassica Oleracea Italica] Other (See Comments)    Pt states his throat closes up  . Fish Allergy Hives  . Vancomycin     Patient states vancomycin caused kidney injury  . Onion Rash   Follow-up  Information   Follow up with [facility]     On 12/11/2013. (4:30 pm for hospital follow up)    Contact information:   [employer] [place] [id number] [phone]       The results of significant diagnostics from this hospitalization (including imaging, microbiology, ancillary and laboratory) are listed below for reference.    Significant Diagnostic Studies: Dg Chest 1 View  11/23/2013   CLINICAL DATA:  Right-sided chest pain.  EXAM: CHEST - 1 VIEW  COMPARISON:  11/19/2013.  FINDINGS: Mediastinum and hilar structures are normal. Cardiomegaly with mild pulmonary venous congestion. Mild interstitial prominence. Findings consistent mild CHF. No pleural effusion pneumothorax. No acute osseus abnormality.  IMPRESSION: Findings consistent with mild congestive heart failure.   Electronically Signed   By: Marcello Moores  Register   On: 11/23/2013 12:35   Dg Chest 2 View  11/19/2013   CLINICAL DATA:  Followup hypoxia.  EXAM: CHEST  2 VIEW  COMPARISON:  11/14/2013.  FINDINGS: Improved perihilar opacities, not yet quite clear consistent with resolving pneumonia. No focal consolidation. Small RIGHT pleural effusion persists. Unchanged cardiomediastinal silhouette.  IMPRESSION: Improved aeration.   Electronically Signed   By: Rolla Flatten M.D.   On: 11/19/2013 12:43   Dg Chest 2 View  11/14/2013   CLINICAL DATA:  Fever, pneumonia  EXAM: CHEST  2 VIEW  COMPARISON:  11/07/2012  FINDINGS: Cardiomediastinal silhouette is stable. Persistent mild perihilar bronchitic changes. Again noted patchy infiltrate in the left perihilar and left infrahilar. Follow-up to complete resolution is recommended. No pulmonary edema.  IMPRESSION: Persistent perihilar bronchitic changes and left perihilar and infrahilar infiltrate/ pneumonia. Follow-up to resolution is recommended. No new infiltrate. No pulmonary edema.   Electronically Signed   By: Lahoma Crocker M.D.   On: 11/14/2013 11:57   Nm  Hepatobiliary  11/17/2013   CLINICAL DATA:  Abdominal pain, nausea, vomiting.  EXAM: NUCLEAR MEDICINE HEPATOBILIARY IMAGING  TECHNIQUE: Sequential images of the abdomen were obtained out to 60 minutes following intravenous administration of radiopharmaceutical.  RADIOPHARMACEUTICALS:  5.0 Mill[place]urie 123XX123 Choletec  COMPARISON:  Ultrasound 11/15/2012  FINDINGS: There is prompt uptake and excretion of radiotracer by the liver. Slight filling of the gallbladder early with the late further filling of the gallbladder. No evidence of cystic duct or common bile duct obstruction.  IMPRESSION: No evidence of biliary obstruction. There is slight filling of the gallbladder early with delayed further filling. This  could reflect mild chronic gallbladder disease.   Electronically Signed   By: Rolm Baptise M.D.   On: 11/17/2013 09:27   US Abdomen Complete  11/15/2013   CLINICAL DATA:  Abdominal pain.  EXAM: ULTRASOUND ABDOMEN COMPLETE  COMPARISON:  CT of the abdomen and pelvis performed 11/07/2013, and renal ultrasound performed 11/11/2013  FINDINGS: Gallbladder:  A small amount of echogenic sludge is noted within the gallbladder. The gallbladder is otherwise unremarkable. No gallbladder wall thickening or pericholecystic fluid is seen. No or sonographic Murphy's sign is elicited.  Common bile duct:  Diameter: 0.5 cm, within normal limits in caliber.  Liver:  No focal lesion identified. Within normal limits in parenchymal echogenicity.  IVC:  No abnormality visualized.  Pancreas:  Visualized portion unremarkable.  Spleen:  Size and appearance within normal limits.  Right Kidney:  Length: 13.8 cm. Significantly increased parenchymal echogenicity noted. No mass or hydronephrosis visualized.  Left Kidney:  Length: 15.3 cm. Significantly increased parenchymal echogenicity noted. No mass or hydronephrosis visualized.  Abdominal aorta:  No aneurysm visualized. The mid to distal abdominal aorta is obscured by overlying bowel gas.   Other findings:  None.  IMPRESSION: 1. No acute abnormality seen in the abdomen. 2. Small amount of echogenic sludge noted within the gallbladder; gallbladder otherwise unremarkable in appearance. 3. Significantly increased bilateral renal parenchymal echogenicity raises concern for medical renal disease, as noted on the prior study.   Electronically Signed   By: Garald Balding M.D.   On: 11/15/2013 21:47   US Renal  11/11/2013   CLINICAL DATA:  Acute renal injury, fever  EXAM: RENAL/URINARY TRACT ULTRASOUND COMPLETE  COMPARISON:  None.  FINDINGS: Right Kidney:  Length: 12.1 cm. Diffuse increased echogenicity of the right kidney is noted.  Left Kidney:  Length: 12.7 cm. Echogenicity within normal limits. No mass or hydronephrosis visualized.  Bladder:  Incompletely distended.  No gross abnormality is noted.  IMPRESSION: Hyperechoic right kidney. This may be related to a component of medical renal disease. No other focal abnormality is noted.   Electronically Signed   By: Inez Catalina M.D.   On: 11/11/2013 10:07   Ct Tibia Fibula Left Wo Contrast  12/06/2013   CLINICAL DATA:  Severe left leg pain and swelling.  EXAM: CT TIBIA FIBULA LEFT WITHOUT CONTRAST  TECHNIQUE: Multidetector CT imaging was performed according to the standard protocol. Multiplanar CT image reconstructions were also generated.  COMPARISON:  None.  FINDINGS: There is diffuse subcutaneous soft tissue swelling/ edema and fluid suggesting cellulitis. I suspect there is also diffuse myofasciitis. No definite CT findings to suggest septic arthritis or osteomyelitis. A small knee joint effusion is noted.  IMPRESSION: Diffuse cellulitis and myofasciitis without definite findings for septic arthritis or osteomyelitis.   Electronically Signed   By: Kalman Jewels M.D.   On: 12/06/2013 13:28   Nm Pulmonary Perf And Vent  11/23/2013   CLINICAL DATA:  Pleuritic chest pain, DVT, question pulmonary embolism  EXAM: NUCLEAR MEDICINE VENTILATION -  PERFUSION LUNG SCAN  TECHNIQUE: Ventilation images were obtained in multiple projections using inhaled aerosol technetium 99 M DTPA. Perfusion images were obtained in multiple projections after intravenous injection of Tc-4m MAA.  RADIOPHARMACEUTICALS:  40 mCi Tc-11m DTPA aerosol inhalation and 6 mCi Tc-51m MAA IV  COMPARISON:  None ; correlation chest radiograph 11/23/2013  FINDINGS: Ventilation: Mild central airway deposition of aerosol. Swallowed aerosol within stomach. Small subsegmental ventilatory defects identified at the lower lobes.  Perfusion: Normal perfusion images.  Chest radiograph  significant for enlargement of cardiac silhouette and question minimal failure.  IMPRESSION: Normal perfusion lung scan.   Electronically Signed   By: Lavonia Dana M.D.   On: 11/23/2013 12:58   US Biopsy  11/17/2013   CLINICAL DATA:  33 year old male with acute renal failure, new diagnosis of HIV and progressive proteinuria. Random renal biopsy is warranted.  EXAM: ULTRASOUND BIOPSY CORE LIVER  Date: 11/17/2013  PROCEDURE: 1. Ultrasound-guided random biopsy of the lower pole cortex of the right kidney Interventional Radiologist:  Criselda Peaches, MD  ANESTHESIA/SEDATION: Moderate (conscious) sedation was used. Two mg Versed, 50 mcg Fentanyl were administered intravenously. The patient's vital signs were monitored continuously by radiology nursing throughout the procedure.  Sedation Time: 19 minutes  TECHNIQUE: Informed consent was obtained from the patient following explanation of the procedure, risks, benefits and alternatives. The patient understands, agrees and consents for the procedure. All questions were addressed. A time out was performed.  The bilateral flanks were interrogated with ultrasound. The right lower renal pole appears more accessible to percutaneous biopsy. An appropriate skin entry site was selected and marked. The region was sterilely prepped and draped in the standard fashion using Betadine skin  prep.  Local anesthesia was attained by infiltration with 1% lidocaine. A small dermatotomy was made. Under real-time sonographic guidance, a 15 gauge trocar needle was advanced and positioned in the retroperitoneal fat just outside the renal parenchyma. Two 16 gauge core biopsies were then obtained, again under real-time sonographic guidance. Post biopsy imaging demonstrates no perinephric hematoma or evidence of active bleeding.  The biopsy specimens were placed in saline. The patient tolerated the procedure well.  COMPLICATIONS: None immediate  IMPRESSION: Technically successful ultrasound-guided random core biopsy of the lower pole cortex of the right kidney.  Signed,  Criselda Peaches, MD  [facility]  [facility]   Electronically Signed   By: Jacqulynn Cadet M.D.   On: 11/17/2013 15:35   Dg Chest Port 1 View  (if Code Sepsis Called)  12/04/2013   CLINICAL DATA:  Fever.  Recent pneumonia.  Left leg pain.  EXAM: PORTABLE CHEST - 1 VIEW  COMPARISON:  Radiographs dated 11/23/2013 and 11/19/2013 and CT scan of the chest dated 11/07/2013  FINDINGS: Heart size and pulmonary vascularity are normal and the lungs are clear. No osseous abnormality. No effusions.  IMPRESSION: Normal chest.   Electronically Signed   By: Rozetta Nunnery M.D.   On: 12/04/2013 16:56    Microbiology: Recent Results (from the past 240 hour(s))  CULTURE, BLOOD (ROUTINE X 2)     Status: None   Collection Time    12/04/13  4:43 PM      Result Value Ref Range Status   Specimen Description BLOOD ARM RIGHT   Final   Special Requests BOTTLES DRAWN AEROBIC AND ANAEROBIC 5CC   Final   Culture  Setup Time     Final   Value: 12/04/2013 23:08     Performed at [facility]   Culture     Final   Value:        BLOOD CULTURE RECEIVED NO GROWTH TO DATE CULTURE WILL BE HELD FOR 5 DAYS BEFORE ISSUING A FINAL NEGATIVE REPORT     Performed at [facility]   Report Status PENDING    Incomplete  CULTURE, BLOOD (ROUTINE X 2)     Status: None   Collection Time    12/04/13  4:45 PM  Result Value Ref Range Status   Specimen Description BLOOD FOREARM LEFT   Final   Special Requests BOTTLES DRAWN AEROBIC AND ANAEROBIC 5CC   Final   Culture  Setup Time     Final   Value: 12/04/2013 23:01     Performed at [facility]   Culture     Final   Value:        BLOOD CULTURE RECEIVED NO GROWTH TO DATE CULTURE WILL BE HELD FOR 5 DAYS BEFORE ISSUING A FINAL NEGATIVE REPORT     Performed at [facility]   Report Status PENDING   Incomplete  URINE CULTURE     Status: None   Collection Time    12/04/13  8:37 PM      Result Value Ref Range Status   Specimen Description URINE, CLEAN CATCH   Final   Special Requests NONE   Final   Culture  Setup Time     Final   Value: 12/05/2013 02:55     Performed at [place]     Final   Value: NO GROWTH     Performed at [facility]   Culture     Final   Value: NO GROWTH     Performed at [facility]   Report Status 12/06/2013 FINAL   Final  AFB CULTURE, BLOOD     Status: None   Collection Time    12/05/13  6:42 PM      Result Value Ref Range Status   Specimen Description BLOOD RIGHT ARM   Final   Special Requests 5CC   Final   Culture     Final   Value: CULTURE WILL BE EXAMINED FOR 6 WEEKS BEFORE ISSUING A FINAL REPORT     Performed at [facility]   Report Status PENDING   Incomplete     Labs: Basic Metabolic Panel:  Recent Labs Lab 12/04/13 0936 12/04/13 1643 12/04/13 1943 12/05/13 0550 12/06/13 0348 12/07/13 0520  NA 133* 133*  --  130* 131* 129*  K 3.4* 3.4*  --  3.6* 3.7 3.5*  CL 103 101  --  100 97 95*  CO2 18* 19  --  18* 21 20  GLUCOSE 116* 111*  --  98 103* 107*  BUN 19 20  --  18 23 25*  CREATININE 2.64* 2.67*  --  2.62* 3.15* 3.08*  CALCIUM 7.9* 8.0*  --  8.0* 8.4 8.2*  MG  --   --  1.6  --   --   --    Liver Function Tests:  Recent  Labs Lab 12/04/13 0936 12/04/13 1643 12/05/13 0550 12/06/13 0348 12/07/13 0520  AST 19 21 18 20 23   ALT 9 10 8 8 9   ALKPHOS 60 68 64 71 66  BILITOT 0.4 0.3 0.2* 0.3 <0.2*  PROT 6.6 7.6 7.1 7.7 7.3  ALBUMIN 2.0* 1.3* 1.2* 1.3* 1.2*   No results found for this basename: LIPASE, AMYLASE,  in the last 168 hours No results found for this basename: AMMONIA,  in the last 168 hours CBC:  Recent Labs Lab 12/02/13 1006 12/04/13 1643 12/05/13 0550 12/06/13 0348 12/07/13 0520  WBC 8.2 8.8 10.6* 10.4 8.6  NEUTROABS 5.7 6.6 7.0 7.0 6.1  HGB 8.4* 8.7* 8.0* 8.1* 7.6*  HCT 25.7* 25.7* 23.7* 24.4* 22.5*  MCV 78.4 78.4 80.3 78.2 77.3*  PLT 294 243 243 301 281   Cardiac Enzymes: No results  found for this basename: CKTOTAL, CKMB, CKMBINDEX, TROPONINI,  in the last 168 hours BNP: BNP (last 3 results)  Recent Labs  11/07/13 0855  PROBNP 33.5   CBG: No results found for this basename: GLUCAP,  in the last 168 hours     Signed:  Saidy Ormand  [facility] 12/07/2013, 2:19 PM

## 2013-12-07 NOTE — Progress Notes (Signed)
Pt temp 100.8. Pt refused Tylenol at that time. Pt asked to remove blankets to see if it would improve. Rogue Bussing, NP notified; no new orders given. Rechecked temp and it was 98.9. Will continue to monitor.

## 2013-12-08 LAB — COMPREHENSIVE METABOLIC PANEL
ALT: 7 U/L (ref 0–53)
AST: 21 U/L (ref 0–37)
Albumin: 1.2 g/dL — ABNORMAL LOW (ref 3.5–5.2)
Alkaline Phosphatase: 63 U/L (ref 39–117)
Anion gap: 14 (ref 5–15)
BUN: 22 mg/dL (ref 6–23)
CO2: 21 mEq/L (ref 19–32)
Calcium: 8.1 mg/dL — ABNORMAL LOW (ref 8.4–10.5)
Chloride: 98 mEq/L (ref 96–112)
Creatinine, Ser: 3.04 mg/dL — ABNORMAL HIGH (ref 0.50–1.35)
GFR calc Af Amer: 30 mL/min — ABNORMAL LOW (ref >90)
GFR calc non Af Amer: 26 mL/min — ABNORMAL LOW (ref >90)
Glucose, Bld: 100 mg/dL — ABNORMAL HIGH (ref 70–99)
Potassium: 3.2 mEq/L — ABNORMAL LOW (ref 3.7–5.3)
Sodium: 133 mEq/L — ABNORMAL LOW (ref 137–147)
Total Bilirubin: <0.2 mg/dL — ABNORMAL LOW (ref 0.3–1.2)
Total Protein: 7.3 g/dL (ref 6.0–8.3)

## 2013-12-08 LAB — CBC WITH DIFFERENTIAL/PLATELET
Basophils Absolute: 0 10*3/uL (ref 0.0–0.1)
Basophils Relative: 0 % (ref 0–1)
Eosinophils Absolute: 0.1 10*3/uL (ref 0.0–0.7)
Eosinophils Relative: 1 % (ref 0–5)
HCT: 21.8 % — ABNORMAL LOW (ref 39.0–52.0)
Hemoglobin: 7.3 g/dL — ABNORMAL LOW (ref 13.0–17.0)
Lymphocytes Relative: 19 % (ref 12–46)
Lymphs Abs: 1.2 10*3/uL (ref 0.7–4.0)
MCH: 26.5 pg (ref 26.0–34.0)
MCHC: 33.5 g/dL (ref 30.0–36.0)
MCV: 79.3 fL (ref 78.0–100.0)
Monocytes Absolute: 0.5 10*3/uL (ref 0.1–1.0)
Monocytes Relative: 8 % (ref 3–12)
Neutro Abs: 4.7 10*3/uL (ref 1.7–7.7)
Neutrophils Relative %: 72 % (ref 43–77)
Platelets: 329 10*3/uL (ref 150–400)
RBC: 2.75 MIL/uL — ABNORMAL LOW (ref 4.22–5.81)
RDW: 14.8 % (ref 11.5–15.5)
WBC: 6.5 10*3/uL (ref 4.0–10.5)

## 2013-12-08 LAB — PROTIME-INR
INR: 2.17 — ABNORMAL HIGH (ref 0.00–1.49)
Prothrombin Time: 24.2 seconds — ABNORMAL HIGH (ref 11.6–15.2)

## 2013-12-08 MED ORDER — DAPSONE 100 MG PO TABS: 100.0000 mg | ORAL_TABLET | Freq: Every day | ORAL | Status: DC

## 2013-12-08 MED ORDER — LAMIVUDINE 150 MG PO TABS: 150.0000 mg | ORAL_TABLET | Freq: Every day | ORAL | Status: DC

## 2013-12-08 NOTE — Discharge Summary (Signed)
Nira Retort to be D/C'd Home per MD order.  Discussed with the patient and all questions fully answered.    Medication List    STOP taking these medications       clindamycin 300 MG capsule  Commonly known as:  CLEOCIN     lamiVUDine 10 MG/ML solution  Commonly known as:  EPIVIR  Replaced by:  lamiVUDine 150 MG tablet      TAKE these medications       abacavir 300 MG tablet  Commonly known as:  ZIAGEN  Take 2 tablets (600 mg total) by mouth daily.     albuterol 108 (90 BASE) MCG/ACT inhaler  Commonly known as:  PROVENTIL HFA;VENTOLIN HFA  Inhale 2 puffs into the lungs every 6 (six) hours as needed for wheezing or shortness of breath.     azithromycin 600 MG tablet  Commonly known as:  ZITHROMAX  Take 2 tablets (1,200 mg total) by mouth once a week.     dapsone 100 MG tablet  Take 1 tablet (100 mg total) by mouth daily.     furosemide 40 MG tablet  Commonly known as:  LASIX  Take 1 tablet (40 mg total) by mouth 2 (two) times daily.     HYDROcodone-acetaminophen 5-325 MG per tablet  Commonly known as:  NORCO/VICODIN  Take 1-2 tablets by mouth every 4 (four) hours as needed for moderate pain.     lamiVUDine 150 MG tablet  Commonly known as:  EPIVIR  Take 1 tablet (150 mg total) by mouth daily.     morphine 60 MG 12 hr tablet  Commonly known as:  MS CONTIN  Take 1 tablet (60 mg total) by mouth every 12 (twelve) hours.     pantoprazole 40 MG tablet  Commonly known as:  PROTONIX  Take 1 tablet (40 mg total) by mouth daily at 12 noon.     raltegravir 400 MG tablet  Commonly known as:  ISENTRESS  Take 1 tablet (400 mg total) by mouth 2 (two) times daily.     valGANciclovir 450 MG tablet  Commonly known as:  VALCYTE  Take 1 tablet (450 mg total) by mouth every other day.     warfarin 5 MG tablet  Commonly known as:  COUMADIN  Take 5 mg by mouth daily at 6 PM.        VVS, Skin clean, dry and intact without evidence of skin break down, no evidence of  skin tears noted. IV catheter discontinued intact. Site without signs and symptoms of complications. Dressing and pressure applied.  An After Visit Summary was printed and given to the patient.  D/c education completed with patient/family including follow up instructions, medication list, d/c activities limitations if indicated, with other d/c instructions as indicated by MD - patient able to verbalize understanding, all questions fully answered.   Patient instructed to return to ED, call 911, or call MD for any changes in condition.   Patient escorted via [place], and D/C home via private auto.  Audria Nine F 12/08/2013 12:04 PM

## 2013-12-09 ENCOUNTER — Encounter: Payer: Self-pay | Admitting: Infectious Diseases

## 2013-12-09 ENCOUNTER — Ambulatory Visit (INDEPENDENT_AMBULATORY_CARE_PROVIDER_SITE_OTHER): Payer: Self-pay | Admitting: Infectious Diseases

## 2013-12-09 ENCOUNTER — Encounter: Payer: Self-pay | Admitting: *Deleted

## 2013-12-09 VITALS — BP 142/87 | HR 131 | Temp 98.1°F | Ht 67.0 in | Wt 180.0 lb

## 2013-12-09 DIAGNOSIS — I82403 Acute embolism and thrombosis of unspecified deep veins of lower extremity, bilateral: Secondary | ICD-10-CM

## 2013-12-09 DIAGNOSIS — N032 Chronic nephritic syndrome with diffuse membranous glomerulonephritis: Secondary | ICD-10-CM

## 2013-12-09 DIAGNOSIS — B20 Human immunodeficiency virus [HIV] disease: Secondary | ICD-10-CM

## 2013-12-09 NOTE — Assessment & Plan Note (Signed)
He appears to be doing well. States he is back to 90% except for his leg swelling.  Will change his epivir to 150 mg daily when he completes his liquid rx. Will see him back in 1 month and repeat his labs then.  He was vaccinated in the hospital for pneumonia and flu. He will need to check hep B S Ab. .  Will look forward to his CD4 coming up and stopping CMV rx, dapsone, azithro.

## 2013-12-09 NOTE — Progress Notes (Signed)
   Subjective:    Patient ID: Jim Wyatt, male    DOB: 06-22-1980, 32 y.o.   MRN: [id number]  HPI 33 yo M with newly dx HIV/AIDS 10-2013. 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 His genotype was naive, his HLA was negative. He was started on ABC/ISN/3TC. He was treated with clinda/primaquine then changed to dapsone. He also had a thrombosed hemorrhoid evacuated.  He returned to hospital on 10-8 with BLE DVTs. He was d/c home on 10-12 on coumadin. His last Cr was 3.04 on 12-08-13. His INR was 2.17.   HIV 1 RNA Quant (copies/mL)  Date Value  11/21/2013 98300*  11/10/2013 67561*     CD4 T Cell Abs (/uL)  Date Value  12/04/2013 80*  11/09/2013 40*   Today c/o continued leg swelling. They do feel better with compression stockings. He has not had to take any pain meds yet. Has f/u with [facility] (Dr Lenna Sciara) for INR monitoring. Has appt 10-15.  Has renal appt 10-14.  Has been back to work.  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   Review of Systems  Constitutional: Positive for unexpected weight change. Negative for fever, chills and appetite change.  Respiratory: Negative for shortness of breath.   Cardiovascular: Negative for chest pain.  Gastrointestinal: Negative for diarrhea and constipation.  Genitourinary: Negative for difficulty urinating.  has lost 30#. Can feel his HR increase with exertion. Feels like he has to force his urine out.      Objective:   Physical Exam  Constitutional: He appears well-developed and well-nourished.  HENT:  Mouth/Throat: No oropharyngeal exudate.  Eyes: EOM are normal. Pupils are equal, round, and reactive to light.  Neck: Neck supple.  Cardiovascular:  tachycardia  Pulmonary/Chest: Effort normal and breath sounds normal.  Abdominal: Soft. Bowel sounds are normal.  Musculoskeletal: He exhibits edema.  Lymphadenopathy:    He has no  cervical adenopathy.          Assessment & Plan:

## 2013-12-09 NOTE — Progress Notes (Signed)
Pt called requesting hydrocodone. Dr. Doreene Burke denied that rx and will speak with him on his next appointment on Thursday.

## 2013-12-09 NOTE — Assessment & Plan Note (Signed)
greatly appreciate renal f/u.

## 2013-12-09 NOTE — Assessment & Plan Note (Signed)
Greatly appreciate [facility] f/u. He has f/u scheduled with them.

## 2013-12-10 LAB — CULTURE, BLOOD (ROUTINE X 2)
Culture: NO GROWTH
Culture: NO GROWTH

## 2013-12-11 ENCOUNTER — Encounter: Payer: Self-pay | Admitting: Internal Medicine

## 2013-12-11 ENCOUNTER — Ambulatory Visit: Payer: Self-pay | Attending: Internal Medicine | Admitting: Internal Medicine

## 2013-12-11 VITALS — BP 160/87 | HR 106 | Temp 98.4°F | Resp 14

## 2013-12-11 DIAGNOSIS — I82402 Acute embolism and thrombosis of unspecified deep veins of left lower extremity: Secondary | ICD-10-CM

## 2013-12-11 DIAGNOSIS — N183 Chronic kidney disease, stage 3 (moderate): Secondary | ICD-10-CM | POA: Insufficient documentation

## 2013-12-11 DIAGNOSIS — Z09 Encounter for follow-up examination after completed treatment for conditions other than malignant neoplasm: Secondary | ICD-10-CM

## 2013-12-11 DIAGNOSIS — I82503 Chronic embolism and thrombosis of unspecified deep veins of lower extremity, bilateral: Secondary | ICD-10-CM | POA: Insufficient documentation

## 2013-12-11 DIAGNOSIS — B20 Human immunodeficiency virus [HIV] disease: Secondary | ICD-10-CM

## 2013-12-11 DIAGNOSIS — Z7901 Long term (current) use of anticoagulants: Secondary | ICD-10-CM | POA: Insufficient documentation

## 2013-12-11 DIAGNOSIS — N032 Chronic nephritic syndrome with diffuse membranous glomerulonephritis: Secondary | ICD-10-CM

## 2013-12-11 DIAGNOSIS — J45909 Unspecified asthma, uncomplicated: Secondary | ICD-10-CM | POA: Insufficient documentation

## 2013-12-11 DIAGNOSIS — Z21 Asymptomatic human immunodeficiency virus [HIV] infection status: Secondary | ICD-10-CM | POA: Insufficient documentation

## 2013-12-11 DIAGNOSIS — Z86718 Personal history of other venous thrombosis and embolism: Secondary | ICD-10-CM | POA: Insufficient documentation

## 2013-12-11 DIAGNOSIS — D631 Anemia in chronic kidney disease: Secondary | ICD-10-CM | POA: Insufficient documentation

## 2013-12-11 DIAGNOSIS — N039 Chronic nephritic syndrome with unspecified morphologic changes: Secondary | ICD-10-CM | POA: Insufficient documentation

## 2013-12-11 LAB — POCT INR: INR: 3.1

## 2013-12-11 MED ORDER — HYDROCODONE-ACETAMINOPHEN 5-325 MG PO TABS: 1.0000 | ORAL_TABLET | Freq: Three times a day (TID) | ORAL | Status: DC | PRN

## 2013-12-11 NOTE — Progress Notes (Signed)
Pt is here following up on his B/L DVT's

## 2013-12-11 NOTE — Progress Notes (Signed)
Patient ID: Jim Wyatt, male   DOB: 05/04/1980, 33 y.o.   MRN: [id number]   Jim Wyatt, is a 33 y.o. male  [id number]  [id number]  DOB - Apr 05, 1980  Chief Complaint  Patient presents with  . Follow-up        Subjective:   Jim Wyatt is a 33 y.o. male here today for a follow up visit. 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 Patient was managed and discharged to be followed up in the clinic today. Patient's sepsis was due to  Community-acquired Pneumonia/PCP pneumonia,  He also had secondary diagnosis of acute kidney injury. Patient was discharged home on Coumadin plan needs to follow-up INR today. Patient denies any new symptoms today. Temperature has subsided. Patient is compliant with medications. Patient has No headache, No chest pain, No abdominal pain - No Nausea, No new weakness tingling or numbness, No Cough - SOB. His kidney functions has improved slightly. He also has anemia of chronic disease with stable hemoglobin. Patient follows up with infectious disease and nephrologist.  Problem  Human Immunodeficiency Virus (Hiv) Disease  Hospital Discharge Follow-Up    ALLERGIES: Allergies  Allergen Reactions  . Augmentin [Amoxicillin-Pot Clavulanate] Other (See Comments)    unknown  . Bactrim [Sulfamethoxazole-Tmp Ds]     Renal failure  . Banana   . Cauliflower [Brassica Oleracea Italica] Other (See Comments)    Pt states his throat closes up  . Fish Allergy Hives  . Vancomycin     Patient states vancomycin caused kidney injury  . Onion Rash    PAST MEDICAL HISTORY: Past Medical History  Diagnosis Date  . Shingles < 2010  . HIV infection dx'd 10/2013  . Cellulitis 11/2013    LLE  . CAP (community acquired pneumonia) 10/2013  . DVT (deep venous thrombosis) 10/2013    BLE  . Multiple food allergies   . Multiple environmental allergies    "trees, dogs, cats"  . Chronic kidney disease   . 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 Authorizing Provider  albuterol (PROVENTIL HFA;VENTOLIN HFA) 108 (90 BASE) MCG/ACT inhaler Inhale 2 puffs into the lungs every 6 (six) hours as needed for wheezing or shortness of breath.   Yes Historical Provider, MD  azithromycin (ZITHROMAX) 600 MG tablet Take 2 tablets (1,200 mg total) by mouth once a week. 11/28/13  Yes Carlyle Basques, MD  dapsone 100 MG tablet Take 1 tablet (100 mg total) by mouth daily. 12/08/13  Yes Domenic Polite, MD  furosemide (LASIX) 40 MG tablet Take 1 tablet (40 mg total) by mouth 2 (two) times daily. 11/25/13  Yes Domenic Polite, MD  morphine (MS CONTIN) 60 MG 12 hr tablet Take 1 tablet (60 mg total) by mouth every 12 (twelve) hours. 12/04/13  Yes [name], MD  pantoprazole (PROTONIX) 40 MG tablet Take 1 tablet (40 mg total) by mouth daily at 12 noon. 11/25/13  Yes Domenic Polite, MD  raltegravir (ISENTRESS) 400 MG tablet Take 1 tablet (400 mg total) by mouth 2 (two) times daily. 12/01/13  Yes Campbell Riches, MD  valGANciclovir (VALCYTE) 450 MG tablet Take 1 tablet (450 mg total) by mouth every other day. 12/01/13  Yes Campbell Riches, MD  warfarin (COUMADIN) 5 MG tablet Take 5 mg by mouth daily at 6 PM.  Yes Historical Provider, MD  abacavir (ZIAGEN) 300 MG tablet Take 2 tablets (600 mg total) by mouth daily. 11/28/13   Carlyle Basques, MD  HYDROcodone-acetaminophen (NORCO/VICODIN) 5-325 MG per tablet Take 1-2 tablets by mouth every 8 (eight) hours as needed for moderate pain. 12/11/13   [name], MD  lamiVUDine (EPIVIR) 150 MG tablet Take 1 tablet (150 mg total) by mouth daily. 12/08/13   Domenic Polite, MD     Objective:   Filed Vitals:   12/11/13 1659  BP: 160/87  Pulse: 106  Temp: 98.4 F (36.9 C)  TempSrc: Oral  Resp: 14  SpO2: 99%     Exam General appearance : Awake, alert, not in any distress. Speech Clear. Not toxic looking HEENT: Atraumatic and Normocephalic, pupils equally reactive to light and accomodation Neck: supple, no JVD. No cervical lymphadenopathy.  Chest:Good air entry bilaterally, no added sounds  CVS: S1 S2 regular, no murmurs.  Abdomen: Bowel sounds present, Non tender and not distended with no gaurding, rigidity or rebound. Extremities: B/L Lower Ext shows ++edema, both legs are warm to touch Neurology: Awake alert, and oriented X 3, CN II-XII intact, Non focal   Data Review No results found for this basename: HGBA1C     Assessment & Plan   1. DVT (deep venous thrombosis), left  - INR today today is 3.1   Hold Coumadin for one day and continue same dose from tomorrow.  2. Human immunodeficiency virus (HIV) disease  Follow-up with infectious disease specialist.  3. 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  [facility] discharge follow-up  Patient is stable , continue current management. Patient has been encouraged to be compliant with all medications.  Return in about 1 week (around 12/18/2013) for INR Check.  The patient was given clear instructions to go to ER or return to medical center if symptoms don't improve, worsen or new problems develop. The patient verbalized understanding. The patient was told to call to get lab results if they haven't heard anything in the next week.   This note has been created with Surveyor, quantity. Any transcriptional errors are unintentional.    [name], MD, [place], [place], [place] and [place], [place]   12/11/2013, 5:42 PM

## 2013-12-18 ENCOUNTER — Ambulatory Visit: Payer: PRIVATE HEALTH INSURANCE | Attending: Internal Medicine | Admitting: Pharmacist

## 2013-12-18 DIAGNOSIS — I82409 Acute embolism and thrombosis of unspecified deep veins of unspecified lower extremity: Secondary | ICD-10-CM

## 2013-12-18 DIAGNOSIS — I82401 Acute embolism and thrombosis of unspecified deep veins of right lower extremity: Secondary | ICD-10-CM

## 2013-12-18 DIAGNOSIS — M7989 Other specified soft tissue disorders: Secondary | ICD-10-CM

## 2013-12-18 DIAGNOSIS — Z8619 Personal history of other infectious and parasitic diseases: Secondary | ICD-10-CM

## 2013-12-18 LAB — POCT INR: INR: 1.2

## 2013-12-25 ENCOUNTER — Ambulatory Visit: Payer: PRIVATE HEALTH INSURANCE | Attending: Internal Medicine | Admitting: Pharmacist

## 2013-12-25 DIAGNOSIS — M7989 Other specified soft tissue disorders: Secondary | ICD-10-CM

## 2013-12-25 DIAGNOSIS — I82409 Acute embolism and thrombosis of unspecified deep veins of unspecified lower extremity: Secondary | ICD-10-CM

## 2013-12-25 DIAGNOSIS — I82403 Acute embolism and thrombosis of unspecified deep veins of lower extremity, bilateral: Secondary | ICD-10-CM

## 2013-12-25 DIAGNOSIS — Z8619 Personal history of other infectious and parasitic diseases: Secondary | ICD-10-CM

## 2013-12-25 LAB — POCT INR: INR: 1.2

## 2013-12-27 ENCOUNTER — Other Ambulatory Visit: Payer: Self-pay | Admitting: Internal Medicine

## 2013-12-28 LAB — AFB CULTURE, BLOOD

## 2014-01-01 ENCOUNTER — Ambulatory Visit: Payer: PRIVATE HEALTH INSURANCE | Attending: Internal Medicine | Admitting: Pharmacist

## 2014-01-01 DIAGNOSIS — I8290 Acute embolism and thrombosis of unspecified vein: Secondary | ICD-10-CM | POA: Diagnosis not present

## 2014-01-01 LAB — POCT INR: INR: 1

## 2014-01-01 MED ORDER — WARFARIN SODIUM 5 MG PO TABS: 5.0000 mg | ORAL_TABLET | Freq: Every day | ORAL | Status: DC

## 2014-01-08 ENCOUNTER — Ambulatory Visit: Payer: Self-pay | Attending: Internal Medicine | Admitting: Pharmacist

## 2014-01-08 DIAGNOSIS — I82403 Acute embolism and thrombosis of unspecified deep veins of lower extremity, bilateral: Secondary | ICD-10-CM

## 2014-01-08 DIAGNOSIS — Z8619 Personal history of other infectious and parasitic diseases: Secondary | ICD-10-CM

## 2014-01-08 DIAGNOSIS — M7989 Other specified soft tissue disorders: Secondary | ICD-10-CM

## 2014-01-08 DIAGNOSIS — I82409 Acute embolism and thrombosis of unspecified deep veins of unspecified lower extremity: Secondary | ICD-10-CM

## 2014-01-08 LAB — POCT INR: INR: 1

## 2014-01-09 ENCOUNTER — Telehealth: Payer: Self-pay | Admitting: *Deleted

## 2014-01-09 NOTE — Telephone Encounter (Signed)
Valcyte rx will run out in 6 days.  Initial rx was covered by a special co-pay card which only covered 1-30-day rx.  Pt's insurance with [employer] does not start until 02/27/2014.   Left message at [facility] and [facility], pt's PCP.  Requested assistance with fill rx for Valcyte.  Cost to the pt would be over $1000 for a 30-day supply.  Requested a call back today to discuss this need.

## 2014-01-12 ENCOUNTER — Ambulatory Visit: Payer: Self-pay | Admitting: Infectious Diseases

## 2014-01-13 NOTE — Telephone Encounter (Signed)
Patient is able to get Valcyte, he will pick it up this Thursday.

## 2014-01-14 NOTE — Telephone Encounter (Addendum)
[facility] cannot help the pt. Per [facility] [place] Counselor, Pt has 80/20 copay card to cover 6 more rxes, out-of-pocket.  Approx $250.00/month for the pt.  Pt gets paid tomorrow, 01/15/14, and will pick up rx then.

## 2014-01-14 NOTE — Telephone Encounter (Signed)
thanks

## 2014-01-15 ENCOUNTER — Ambulatory Visit: Payer: Self-pay | Attending: Internal Medicine | Admitting: Pharmacist

## 2014-01-15 DIAGNOSIS — I82403 Acute embolism and thrombosis of unspecified deep veins of lower extremity, bilateral: Secondary | ICD-10-CM | POA: Insufficient documentation

## 2014-01-15 DIAGNOSIS — M7989 Other specified soft tissue disorders: Secondary | ICD-10-CM

## 2014-01-15 DIAGNOSIS — Z8619 Personal history of other infectious and parasitic diseases: Secondary | ICD-10-CM

## 2014-01-15 LAB — POCT INR: INR: 1.1

## 2014-01-15 NOTE — Patient Instructions (Signed)
Take 1 1/2 tablet tonight and tomorrow then 1 tablet daily until seen next Thursday.

## 2014-01-18 LAB — AFB CULTURE, BLOOD

## 2014-01-21 ENCOUNTER — Ambulatory Visit: Payer: Self-pay | Attending: Internal Medicine | Admitting: Pharmacist

## 2014-01-21 DIAGNOSIS — Z8619 Personal history of other infectious and parasitic diseases: Secondary | ICD-10-CM

## 2014-01-21 DIAGNOSIS — I82403 Acute embolism and thrombosis of unspecified deep veins of lower extremity, bilateral: Secondary | ICD-10-CM

## 2014-01-21 DIAGNOSIS — I82409 Acute embolism and thrombosis of unspecified deep veins of unspecified lower extremity: Secondary | ICD-10-CM

## 2014-01-21 DIAGNOSIS — M7989 Other specified soft tissue disorders: Secondary | ICD-10-CM

## 2014-01-21 LAB — POCT INR: INR: 0.9

## 2014-01-21 MED ORDER — FUROSEMIDE 40 MG PO TABS: 40.0000 mg | ORAL_TABLET | Freq: Two times a day (BID) | ORAL | Status: DC

## 2014-01-21 MED ORDER — WARFARIN SODIUM 7.5 MG PO TABS: 7.5000 mg | ORAL_TABLET | Freq: Every day | ORAL | Status: DC

## 2014-01-21 NOTE — Progress Notes (Signed)
Take Coumadin 10 mg tab Wednesday through Friday then continue 7.5 mg tab daily Return to clinic next Thursday for repeat

## 2014-01-21 NOTE — Patient Instructions (Signed)
Vitamin K Foods and Warfarin Warfarin is a medicine that helps prevent harmful blood clots by causing blood to clot more slowly. It does this by decreasing the activity of vitamin K, which promotes normal blood clotting. For the dose of warfarin you have been prescribed to work well, you need to get about the same amount of vitamin K from your food from day to day. Suddenly getting a lot more vitamin K could cause your blood to clot too quickly. A sudden decrease in vitamin K intake could cause your blood to clot too slowly. These changes in vitamin K intake could lead to dangerous blood clotsor to bleeding. WHAT GENERAL GUIDELINES DO I NEED TO FOLLOW?  Keep your intake of vitamin K consistent from day to day. To do this, you must be aware of which foods contain moderate or high amounts of vitamin K. Listed below are some foods that are very high, high, or moderately high in vitamin K. If you eat these foods, make sure you eat a consistent amount of them from day to day.  Avoid major changes in your diet, or tell your health care provider before changing your diet.  If you take a multivitamin that contains vitamin K, be sure to take it every day.  If you drink green tea, drink the same amount each day. WHAT FOODS ARE VERY HIGH IN VITAMIN K?   Greens, such as Swiss chard and beet, collard, mustard, or turnip greens (fresh or frozen, cooked).  Kale (fresh or frozen, cooked).   Parsley (raw).  Spinach (cooked).  WHAT FOODS ARE HIGH IN VITAMIN K?  Asparagus (frozen, cooked).  Beans, green (frozen, cooked).  Broccoli.   Bok choy (cooked).   Brussels sprouts (fresh or frozen, cooked).  Cabbage (cooked).  Coleslaw. WHAT FOODS ARE MODERATELY HIGH IN VITAMIN K?  Blueberries.  Black-eyed peas.  Endive (raw).   Green leaf lettuce (raw).   Green scallions (raw).  Kale (raw).  Okra (frozen, cooked).  Plantains (fried).  Romaine lettuce (raw).   Sauerkraut  (canned).   Spinach (raw). Document Released: 12/11/2008 Document Revised: 02/18/2013 Document Reviewed: 12/18/2012 [facility] Patient Information 2015 [place], [place]. This information is not intended to replace advice given to you by your health care provider. Make sure you discuss any questions you have with your health care provider. Warfarin: What You Need to Know Warfarin is an anticoagulant. Anticoagulants help prevent the formation of blood clots. They also help stop the growth of blood clots. Warfarin is sometimes referred to as a "blood thinner."  Normally, when body tissues are cut or damaged, the blood clots in order to prevent blood loss. Sometimes clots form inside your blood vessels and obstruct the flow of blood through your circulatory system (thrombosis). These clots may travel through your bloodstream and become lodged in smaller blood vessels in your brain, which can cause a stroke, or in your lungs (pulmonary embolism). WHO SHOULD USE WARFARIN? Warfarin is prescribed for people at risk of developing harmful blood clots:  People with surgically implanted mechanical heart valves, irregular heart rhythms called atrial fibrillation, and certain clotting disorders.  People who have developed harmful blood clotting in the past, including those who have had a stroke or a pulmonary embolism, or thrombosis in their legs (deep vein thrombosis [DVT]).  People with an existing blood clot, such as a pulmonary embolism. WARFARIN DOSING Warfarin tablets come in different strengths. Each tablet strength is a different color, with the amount of warfarin (in milligrams) clearly printed  on the tablet. If the color of your tablet is different than usual when you receive a new prescription, report it immediately to your pharmacist or health care provider. WARFARIN MONITORING The goal of warfarin therapy is to lessen the clotting tendency of blood but not prevent clotting completely. Your health  care provider will monitor the anticoagulation effect of warfarin closely and adjust your dose as needed. For your safety, blood tests called prothrombin time (PT) or international normalized ratio (INR) are used to measure the effects of warfarin. Both of these tests can be done with a finger stick or a blood draw. The longer it takes the blood to clot, the higher the PT or INR. Your health care provider will inform you of your "target" PT or INR range. If, at any time, your PT or INR is above the target range, there is a risk of bleeding. If your PT or INR is below the target range, there is a risk of clotting. Whether you are started on warfarin while you are in the hospital or in your health care provider's office, you will need to have your PT or INR checked within one week of starting the medicine. Initially, some people are asked to have their PT or INR checked as much as twice a week. Once you are on a stable maintenance dose, the PT or INR is checked less often, usually once every 2 to 4 weeks. The warfarin dose may be adjusted if the PT or INR is not within the target range. It is important to keep all laboratory and health care provider follow-up appointments. Not keeping appointments could result in a chronic or permanent injury, pain, or disability because warfarin is a medicine that requires close monitoring. WHAT ARE THE SIDE EFFECTS OF WARFARIN?  Too much warfarin can cause bleeding (hemorrhage) from any part of the body. This may include bleeding from the gums, blood in the urine, bloody or dark stools, a nosebleed that is not easily stopped, coughing up blood, or vomiting blood.  Too little warfarin can increase the risk of blood clots.  Too little or too much warfarin can also increase the risk of a stroke.  Warfarin use may cause a skin rash or irritation, an unusual fever, continual nausea or stomach upset, or severe pain in your joints or back. SPECIAL PRECAUTIONS WHILE TAKING  WARFARIN Warfarin should be taken exactly as directed. It is very important to take warfarin as directed since bleeding or blood clots could result in chronic or permanent injury, pain, or disability.  Take your medicine at the same time every day. If you forget to take your dose, you can take it if it is within 6 hours of when it was due.  Do not change the dose of warfarin on your own to make up for missed or extra doses.  If you miss more than 2 doses in a row, you should contact your health care provider for advice. Avoid situations that cause bleeding. You may have a tendency to bleed more easily than usual while taking warfarin. The following actions can limit bleeding:  Using a softer toothbrush.  Flossing with waxed floss rather than unwaxed floss.  Shaving with an Copy rather than a blade.  Limiting the use of sharp objects.  Avoiding potentially harmful activities, such as contact sports. Warfarin and Pregnancy or Breastfeeding  Warfarin is not advised during the first trimester of pregnancy due to an increased risk of birth defects. In certain situations, a  woman may take warfarin after her first trimester of pregnancy. A woman who becomes pregnant or plans to become pregnant while taking warfarin should notify her health care provider immediately.  Although warfarin does not pass into breast milk, a woman who wishes to breastfeed while taking warfarin should also consult with her health care provider. Alcohol, Smoking, and Illicit Drug Use  Alcohol affects how warfarin works in the body. It is best to avoid alcoholic drinks or consume very small amounts while taking warfarin. In general, alcohol intake should be limited to 1 oz (30 mL) of liquor, 6 oz (180 mL) of wine, or 12 oz (360 mL) of beer each day. Notify your health care provider if you change your alcohol intake.  Smoking affects how warfarin works. It is best to avoid smoking while taking warfarin. Notify  your health care provider if you change your smoking habits.  It is best to avoid all illicit drugs while taking warfarin since there are few studies that show how warfarin interacts with these drugs. Other Medicines and Dietary Supplements Many prescription and over-the-counter medicines can interfere with warfarin. Be sure all of your health care providers know you are taking warfarin. Notify your health care provider who prescribed warfarin for you or your pharmacist before starting or stopping any new medicines, including over-the-counter vitamins, dietary supplements, and pain medicines. Your warfarin dose may need to be adjusted. Some common over-the-counter medicines that may increase the risk of bleeding while taking warfarin include:   Acetaminophen.  Aspirin.  Nonsteroidal anti-inflammatory medicines (NSAIDs), such as ibuprofen or naproxen.  Vitamin E. Dietary Considerations  Foods that have moderate or high amounts of vitamin K can interfere with warfarin. Avoid major changes in your diet or notify your health care provider before changing your diet. Eat a consistent amount of foods that have moderate or high amounts of vitamin K. Eating less foods containing vitamin K can increase the risk of bleeding. Eating more foods containing vitamin K can increase the risk of blood clots. Additional questions about dietary considerations can be discussed with a dietitian. Foods that are very high in vitamin K:  Greens, such as Swiss chard and beet, collard, mustard, or turnip greens (fresh or frozen, cooked).  Kale (fresh or frozen, cooked).  Parsley (raw).  Spinach (cooked). Foods that are high in vitamin K:  Asparagus (frozen, cooked).  Beans, green (frozen, cooked).  Broccoli.  Bok choy (cooked).  Brussels sprouts (fresh or frozen, cooked).  Cabbage (cooked).   Coleslaw. Foods that are moderately high in vitamin K:  Blueberries.  Black-eyed peas.  Endive  (raw).  Green leaf lettuce (raw).  Green scallions (raw).  Kale (raw).  Okra (frozen, cooked).  Plantains (fried).  Romaine lettuce (raw).  Sauerkraut (canned).  Spinach (raw). CALL YOUR CLINIC OR HEALTH CARE PROVIDER IF YOU:  Plan to have any surgery or procedure.  Feel sick, especially if you have diarrhea or vomiting.  Experience or anticipate any major changes in your diet.  Start or stop a prescription or over-the-counter medicine.  Become, plan to become, or think you may be pregnant.  Are having heavier than usual menstrual periods.  Have had a fall, accident, or any symptoms of bleeding or unusual bruising.  Develop an unusual fever. CALL 911 IN THE [place] OR GO TO THE EMERGENCY DEPARTMENT IF YOU:   Think you may be having an allergic reaction to warfarin. The signs of an allergic reaction could include itching, rash, hives, swelling, chest tightness, or  trouble breathing.  See signs of blood in your urine. The signs could include reddish, pinkish, or tea-colored urine.  See signs of blood in your stools. The signs could include bright red or black stools.  Vomit or cough up blood. In these instances, the blood could have either a bright red or a "coffee-grounds" appearance.  Have bleeding that will not stop after applying pressure for 30 minutes such as cuts, nosebleeds, or other injuries.  Have severe pain in your joints or back.  Have a new and severe headache.  Have sudden weakness or numbness of your face, arm, or leg, especially on one side of your body.  Have sudden confusion or trouble understanding.  Have sudden trouble seeing in one or both eyes.  Have sudden trouble walking, dizziness, loss of balance, or coordination.  Have trouble speaking or understanding (aphasia). Document Released: 02/13/2005 Document Revised: 06/30/2013 Document Reviewed: 08/09/2012 [facility] Patient Information 2015 [place], [place]. This information is not intended  to replace advice given to you by your health care provider. Make sure you discuss any questions you have with your health care provider.

## 2014-01-29 ENCOUNTER — Ambulatory Visit: Payer: Self-pay | Attending: Internal Medicine | Admitting: Pharmacist

## 2014-01-29 DIAGNOSIS — I8290 Acute embolism and thrombosis of unspecified vein: Secondary | ICD-10-CM | POA: Insufficient documentation

## 2014-01-29 LAB — POCT INR: INR: 0.9

## 2014-02-04 ENCOUNTER — Encounter: Payer: Self-pay | Admitting: Infectious Diseases

## 2014-02-04 ENCOUNTER — Ambulatory Visit (INDEPENDENT_AMBULATORY_CARE_PROVIDER_SITE_OTHER): Payer: Self-pay | Admitting: Infectious Diseases

## 2014-02-04 ENCOUNTER — Telehealth: Payer: Self-pay | Admitting: *Deleted

## 2014-02-04 VITALS — Temp 98.7°F | Wt 201.0 lb

## 2014-02-04 DIAGNOSIS — I82409 Acute embolism and thrombosis of unspecified deep veins of unspecified lower extremity: Secondary | ICD-10-CM

## 2014-02-04 DIAGNOSIS — B20 Human immunodeficiency virus [HIV] disease: Secondary | ICD-10-CM

## 2014-02-04 DIAGNOSIS — Z79899 Other long term (current) drug therapy: Secondary | ICD-10-CM

## 2014-02-04 DIAGNOSIS — Z113 Encounter for screening for infections with a predominantly sexual mode of transmission: Secondary | ICD-10-CM

## 2014-02-04 NOTE — Assessment & Plan Note (Signed)
Appears to be doing better (swelling improved). He will f/u with PCP regarding coumadin dosing (INR is low).

## 2014-02-04 NOTE — Telephone Encounter (Signed)
Left patient a voice mail to find out which pharmacy he would like his medications sent to. Myrtis Hopping CMA

## 2014-02-04 NOTE — Progress Notes (Signed)
   Subjective:    Patient ID: Jim Wyatt, male    DOB: 1980-05-04, 33 y.o.   MRN: [id number]  HPI 33 yo M with newly dx HIV/AIDS 10-2013. 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 His genotype was naive, his HLA was negative. He was started on ABC/ISN/3TC. He was treated with clinda/primaquine then changed to dapsone. He also had a thrombosed hemorrhoid evacuated.  He returned to hospital on 10-8 with BLE DVTs. He was d/c home on 10-12 on coumadin. He has been off art for 1 week, ran out. Has not been able to get to pharmacy. Is still taking coumadin.  Legs are back to normal size.   HIV 1 RNA QUANT (copies/mL)  Date Value  11/21/2013 98300*  11/10/2013 67561*   CD4 T CELL ABS (/uL)  Date Value  12/04/2013 80*  11/09/2013 40*    Review of Systems  Constitutional: Negative for appetite change and unexpected weight change.  Gastrointestinal: Negative for diarrhea and constipation.  Genitourinary: Positive for difficulty urinating.      Objective:   Physical Exam  Constitutional: He appears well-developed and well-nourished.  HENT:  Mouth/Throat: No oropharyngeal exudate.  Eyes: EOM are normal. Pupils are equal, round, and reactive to light.  Neck: Neck supple.  Cardiovascular: Normal rate, regular rhythm and normal heart sounds.   Pulmonary/Chest: Effort normal and breath sounds normal.  Abdominal: Soft. Bowel sounds are normal. He exhibits no distension. There is no tenderness.  Musculoskeletal: He exhibits no edema.  Lymphadenopathy:    He has no cervical adenopathy.          Assessment & Plan:

## 2014-02-04 NOTE — Assessment & Plan Note (Signed)
He has been off his meds, synchronously. Pt will get his rx filled. Will have his CM f/u with him. Will not do labs today due to his being off meds. rtc 2 months with labs prior.

## 2014-02-05 ENCOUNTER — Ambulatory Visit: Payer: Self-pay | Attending: Internal Medicine | Admitting: Pharmacist

## 2014-02-05 DIAGNOSIS — I82409 Acute embolism and thrombosis of unspecified deep veins of unspecified lower extremity: Secondary | ICD-10-CM

## 2014-02-05 LAB — POCT INR: INR: 1

## 2014-02-05 MED ORDER — RIVAROXABAN (XARELTO) VTE STARTER PACK (15 & 20 MG): ORAL_TABLET | ORAL | Status: DC

## 2014-02-05 NOTE — Patient Instructions (Signed)
Stop taking coumadin We will start you on Xarelto 15 mg tab twice a day x 21 days then switch to 20 mg tab daily Return with next scheduled office visit

## 2014-02-27 HISTORY — PX: INSERTION OF DIALYSIS CATHETER: SHX1324

## 2014-03-25 ENCOUNTER — Other Ambulatory Visit: Payer: Self-pay

## 2014-04-08 ENCOUNTER — Ambulatory Visit: Payer: Self-pay | Admitting: Infectious Diseases

## 2014-07-30 ENCOUNTER — Inpatient Hospital Stay (HOSPITAL_COMMUNITY)
Admission: EM | Admit: 2014-07-30 | Discharge: 2014-08-02 | DRG: 975 | Disposition: A | Discharge status: Home / Self Care | Payer: No Typology Code available for payment source | Attending: Internal Medicine | Admitting: Internal Medicine

## 2014-07-30 ENCOUNTER — Emergency Department (HOSPITAL_COMMUNITY): Payer: No Typology Code available for payment source

## 2014-07-30 ENCOUNTER — Inpatient Hospital Stay (HOSPITAL_COMMUNITY): Payer: No Typology Code available for payment source

## 2014-07-30 ENCOUNTER — Encounter (HOSPITAL_COMMUNITY): Payer: Self-pay | Admitting: *Deleted

## 2014-07-30 ENCOUNTER — Other Ambulatory Visit (HOSPITAL_COMMUNITY): Payer: Self-pay

## 2014-07-30 DIAGNOSIS — N189 Chronic kidney disease, unspecified: Secondary | ICD-10-CM | POA: Insufficient documentation

## 2014-07-30 DIAGNOSIS — N08 Glomerular disorders in diseases classified elsewhere: Secondary | ICD-10-CM | POA: Diagnosis not present

## 2014-07-30 DIAGNOSIS — Z6830 Body mass index (BMI) 30.0-30.9, adult: Secondary | ICD-10-CM | POA: Diagnosis not present

## 2014-07-30 DIAGNOSIS — A419 Sepsis, unspecified organism: Secondary | ICD-10-CM | POA: Diagnosis present

## 2014-07-30 DIAGNOSIS — B20 Human immunodeficiency virus [HIV] disease: Secondary | ICD-10-CM | POA: Diagnosis present

## 2014-07-30 DIAGNOSIS — Z86718 Personal history of other venous thrombosis and embolism: Secondary | ICD-10-CM | POA: Diagnosis not present

## 2014-07-30 DIAGNOSIS — J189 Pneumonia, unspecified organism: Secondary | ICD-10-CM | POA: Diagnosis present

## 2014-07-30 DIAGNOSIS — M545 Low back pain, unspecified: Secondary | ICD-10-CM

## 2014-07-30 DIAGNOSIS — N039 Chronic nephritic syndrome with unspecified morphologic changes: Secondary | ICD-10-CM | POA: Diagnosis present

## 2014-07-30 DIAGNOSIS — I120 Hypertensive chronic kidney disease with stage 5 chronic kidney disease or end stage renal disease: Secondary | ICD-10-CM | POA: Diagnosis present

## 2014-07-30 DIAGNOSIS — E872 Acidosis: Secondary | ICD-10-CM | POA: Diagnosis present

## 2014-07-30 DIAGNOSIS — Z881 Allergy status to other antibiotic agents status: Secondary | ICD-10-CM

## 2014-07-30 DIAGNOSIS — D638 Anemia in other chronic diseases classified elsewhere: Secondary | ICD-10-CM | POA: Diagnosis present

## 2014-07-30 DIAGNOSIS — Z91018 Allergy to other foods: Secondary | ICD-10-CM

## 2014-07-30 DIAGNOSIS — D6959 Other secondary thrombocytopenia: Secondary | ICD-10-CM | POA: Diagnosis present

## 2014-07-30 DIAGNOSIS — R630 Anorexia: Secondary | ICD-10-CM | POA: Diagnosis present

## 2014-07-30 DIAGNOSIS — N051 Unspecified nephritic syndrome with focal and segmental glomerular lesions: Secondary | ICD-10-CM

## 2014-07-30 DIAGNOSIS — Z9119 Patient's noncompliance with other medical treatment and regimen: Secondary | ICD-10-CM | POA: Diagnosis present

## 2014-07-30 DIAGNOSIS — Z9114 Patient's other noncompliance with medication regimen: Secondary | ICD-10-CM | POA: Diagnosis not present

## 2014-07-30 DIAGNOSIS — N032 Chronic nephritic syndrome with diffuse membranous glomerulonephritis: Secondary | ICD-10-CM | POA: Diagnosis present

## 2014-07-30 DIAGNOSIS — R509 Fever, unspecified: Secondary | ICD-10-CM | POA: Diagnosis present

## 2014-07-30 DIAGNOSIS — D696 Thrombocytopenia, unspecified: Secondary | ICD-10-CM | POA: Diagnosis present

## 2014-07-30 DIAGNOSIS — N269 Renal sclerosis, unspecified: Secondary | ICD-10-CM | POA: Diagnosis present

## 2014-07-30 DIAGNOSIS — N179 Acute kidney failure, unspecified: Secondary | ICD-10-CM | POA: Diagnosis not present

## 2014-07-30 DIAGNOSIS — Z91013 Allergy to seafood: Secondary | ICD-10-CM | POA: Diagnosis not present

## 2014-07-30 DIAGNOSIS — N185 Chronic kidney disease, stage 5: Secondary | ICD-10-CM | POA: Diagnosis present

## 2014-07-30 LAB — CBC WITH DIFFERENTIAL/PLATELET
Basophils Absolute: 0 10*3/uL (ref 0.0–0.1)
Basophils Relative: 0 % (ref 0–1)
Eosinophils Absolute: 0.2 10*3/uL (ref 0.0–0.7)
Eosinophils Relative: 1 % (ref 0–5)
HCT: 31.9 % — ABNORMAL LOW (ref 39.0–52.0)
Hemoglobin: 10.9 g/dL — ABNORMAL LOW (ref 13.0–17.0)
Lymphocytes Relative: 16 % (ref 12–46)
Lymphs Abs: 1.8 10*3/uL (ref 0.7–4.0)
MCH: 27.1 pg (ref 26.0–34.0)
MCHC: 34.2 g/dL (ref 30.0–36.0)
MCV: 79.4 fL (ref 78.0–100.0)
Monocytes Absolute: 0.7 10*3/uL (ref 0.1–1.0)
Monocytes Relative: 6 % (ref 3–12)
Neutro Abs: 8.9 10*3/uL — ABNORMAL HIGH (ref 1.7–7.7)
Neutrophils Relative %: 77 % (ref 43–77)
Platelets: 139 10*3/uL — ABNORMAL LOW (ref 150–400)
RBC: 4.02 MIL/uL — ABNORMAL LOW (ref 4.22–5.81)
RDW: 13.4 % (ref 11.5–15.5)
WBC: 11.5 10*3/uL — ABNORMAL HIGH (ref 4.0–10.5)

## 2014-07-30 LAB — COMPREHENSIVE METABOLIC PANEL
ALT: 18 U/L (ref 17–63)
AST: 19 U/L (ref 15–41)
Albumin: 2.7 g/dL — ABNORMAL LOW (ref 3.5–5.0)
Alkaline Phosphatase: 50 U/L (ref 38–126)
Anion gap: 9 (ref 5–15)
BUN: 44 mg/dL — ABNORMAL HIGH (ref 6–20)
CO2: 18 mmol/L — ABNORMAL LOW (ref 22–32)
Calcium: 8.4 mg/dL — ABNORMAL LOW (ref 8.9–10.3)
Chloride: 103 mmol/L (ref 101–111)
Creatinine, Ser: 6.7 mg/dL — ABNORMAL HIGH (ref 0.61–1.24)
GFR calc Af Amer: 11 mL/min — ABNORMAL LOW (ref >60)
GFR calc non Af Amer: 10 mL/min — ABNORMAL LOW (ref >60)
Glucose, Bld: 93 mg/dL (ref 65–99)
Potassium: 5 mmol/L (ref 3.5–5.1)
Sodium: 130 mmol/L — ABNORMAL LOW (ref 135–145)
Total Bilirubin: 0.5 mg/dL (ref 0.3–1.2)
Total Protein: 9.1 g/dL — ABNORMAL HIGH (ref 6.5–8.1)

## 2014-07-30 LAB — URINALYSIS, ROUTINE W REFLEX MICROSCOPIC
Bilirubin Urine: NEGATIVE
Glucose, UA: NEGATIVE mg/dL
Ketones, ur: NEGATIVE mg/dL
Leukocytes, UA: NEGATIVE
Nitrite: NEGATIVE
Protein, ur: >300 mg/dL — AB
Specific Gravity, Urine: 1.011 (ref 1.005–1.030)
Urobilinogen, UA: 0.2 mg/dL (ref 0.0–1.0)
pH: 7 (ref 5.0–8.0)

## 2014-07-30 LAB — BRAIN NATRIURETIC PEPTIDE: B Natriuretic Peptide: 28.7 pg/mL (ref 0.0–100.0)

## 2014-07-30 LAB — MAGNESIUM: Magnesium: 1.6 mg/dL — ABNORMAL LOW (ref 1.7–2.4)

## 2014-07-30 LAB — STREP PNEUMONIAE URINARY ANTIGEN: Strep Pneumo Urinary Antigen: NEGATIVE

## 2014-07-30 LAB — PHOSPHORUS: Phosphorus: 3.6 mg/dL (ref 2.5–4.6)

## 2014-07-30 LAB — URINE MICROSCOPIC-ADD ON

## 2014-07-30 LAB — TROPONIN I: Troponin I: <0.03 ng/mL (ref <0.031)

## 2014-07-30 LAB — INFLUENZA PANEL BY PCR (TYPE A & B)
H1N1 flu by pcr: NOT DETECTED
Influenza A By PCR: NEGATIVE
Influenza B By PCR: NEGATIVE

## 2014-07-30 LAB — SAVE SMEAR

## 2014-07-30 LAB — I-STAT CG4 LACTIC ACID, ED: Lactic Acid, Venous: 0.74 mmol/L (ref 0.5–2.0)

## 2014-07-30 MED ORDER — DAPSONE 100 MG PO TABS
100.0000 mg | ORAL_TABLET | Freq: Every day | ORAL | Status: DC
  Administered 2014-07-30 – 2014-08-02 (×4): 100 mg via ORAL
  Filled 2014-07-30 (×4): qty 1

## 2014-07-30 MED ORDER — AZITHROMYCIN 500 MG IV SOLR
500.0000 mg | INTRAVENOUS | Status: DC
  Administered 2014-07-31: 500 mg via INTRAVENOUS
  Filled 2014-07-30 (×2): qty 500

## 2014-07-30 MED ORDER — DOXYCYCLINE HYCLATE 100 MG PO TABS: 100.0000 mg | ORAL_TABLET | Freq: Two times a day (BID) | ORAL | Status: DC

## 2014-07-30 MED ORDER — MORPHINE SULFATE 4 MG/ML IJ SOLN
4.0000 mg | Freq: Once | INTRAMUSCULAR | Status: AC
  Administered 2014-07-30: 4 mg via INTRAVENOUS
  Filled 2014-07-30: qty 1

## 2014-07-30 MED ORDER — LEVOFLOXACIN IN D5W 500 MG/100ML IV SOLN: 500.0000 mg | INTRAVENOUS | Status: DC

## 2014-07-30 MED ORDER — HYDRALAZINE HCL 20 MG/ML IJ SOLN: 5.0000 mg | Freq: Four times a day (QID) | INTRAMUSCULAR | Status: DC | PRN

## 2014-07-30 MED ORDER — ABACAVIR SULFATE 300 MG PO TABS
600.0000 mg | ORAL_TABLET | Freq: Every day | ORAL | Status: DC
  Administered 2014-07-30 – 2014-08-02 (×4): 600 mg via ORAL
  Filled 2014-07-30 (×4): qty 2

## 2014-07-30 MED ORDER — ACETAMINOPHEN 325 MG PO TABS
650.0000 mg | ORAL_TABLET | Freq: Four times a day (QID) | ORAL | Status: DC | PRN
  Administered 2014-07-30 – 2014-07-31 (×2): 650 mg via ORAL
  Filled 2014-07-30 (×2): qty 2

## 2014-07-30 MED ORDER — CEFTRIAXONE SODIUM IN DEXTROSE 20 MG/ML IV SOLN
1.0000 g | INTRAVENOUS | Status: DC
  Administered 2014-07-31: 1 g via INTRAVENOUS
  Filled 2014-07-30 (×2): qty 50

## 2014-07-30 MED ORDER — BUDESONIDE 0.25 MG/2ML IN SUSP
0.2500 mg | Freq: Two times a day (BID) | RESPIRATORY_TRACT | Status: DC
  Administered 2014-07-31 – 2014-08-02 (×4): 0.25 mg via RESPIRATORY_TRACT
  Filled 2014-07-30 (×10): qty 2

## 2014-07-30 MED ORDER — LAMIVUDINE 10 MG/ML PO SOLN
100.0000 mg | Freq: Every day | ORAL | Status: DC
  Administered 2014-07-31 – 2014-08-02 (×3): 100 mg via ORAL
  Filled 2014-07-30 (×3): qty 10

## 2014-07-30 MED ORDER — LAMIVUDINE 150 MG PO TABS
150.0000 mg | ORAL_TABLET | Freq: Once | ORAL | Status: AC
  Administered 2014-07-30: 150 mg via ORAL
  Filled 2014-07-30: qty 1

## 2014-07-30 MED ORDER — FUROSEMIDE 40 MG PO TABS: 40.0000 mg | ORAL_TABLET | Freq: Every day | ORAL | Status: DC

## 2014-07-30 MED ORDER — AMLODIPINE BESYLATE 5 MG PO TABS
5.0000 mg | ORAL_TABLET | Freq: Every day | ORAL | Status: DC
  Administered 2014-07-30 – 2014-08-02 (×4): 5 mg via ORAL
  Filled 2014-07-30 (×4): qty 1

## 2014-07-30 MED ORDER — ONDANSETRON HCL 4 MG/2ML IJ SOLN: 4.0000 mg | Freq: Four times a day (QID) | INTRAMUSCULAR | Status: DC | PRN

## 2014-07-30 MED ORDER — SODIUM CHLORIDE 0.9 % IV BOLUS (SEPSIS)
1000.0000 mL | Freq: Once | INTRAVENOUS | Status: AC
  Administered 2014-07-30: 1000 mL via INTRAVENOUS

## 2014-07-30 MED ORDER — IPRATROPIUM-ALBUTEROL 0.5-2.5 (3) MG/3ML IN SOLN: 3.0000 mL | RESPIRATORY_TRACT | Status: DC | PRN

## 2014-07-30 MED ORDER — SODIUM CHLORIDE 0.9 % IV SOLN: INTRAVENOUS | Status: DC

## 2014-07-30 MED ORDER — LEVOFLOXACIN IN D5W 750 MG/150ML IV SOLN
750.0000 mg | Freq: Once | INTRAVENOUS | Status: AC
  Administered 2014-07-30: 750 mg via INTRAVENOUS
  Filled 2014-07-30: qty 150

## 2014-07-30 MED ORDER — RALTEGRAVIR POTASSIUM 400 MG PO TABS
400.0000 mg | ORAL_TABLET | Freq: Two times a day (BID) | ORAL | Status: DC
  Administered 2014-07-30 – 2014-08-02 (×6): 400 mg via ORAL
  Filled 2014-07-30 (×7): qty 1

## 2014-07-30 MED ORDER — SODIUM CHLORIDE 0.9 % IV SOLN
INTRAVENOUS | Status: AC
  Administered 2014-07-30 – 2014-07-31 (×2): via INTRAVENOUS

## 2014-07-30 MED ORDER — GUAIFENESIN ER 600 MG PO TB12
600.0000 mg | ORAL_TABLET | Freq: Two times a day (BID) | ORAL | Status: DC
  Administered 2014-07-30 – 2014-08-02 (×7): 600 mg via ORAL
  Filled 2014-07-30 (×8): qty 1

## 2014-07-30 MED ORDER — LEVOFLOXACIN IN D5W 500 MG/100ML IV SOLN
500.0000 mg | INTRAVENOUS | Status: DC
Start: 2014-08-01 — End: 2014-07-30

## 2014-07-30 MED ORDER — SODIUM BICARBONATE 650 MG PO TABS
650.0000 mg | ORAL_TABLET | Freq: Two times a day (BID) | ORAL | Status: DC
  Administered 2014-07-30 (×2): 650 mg via ORAL
  Filled 2014-07-30 (×4): qty 1

## 2014-07-30 MED ORDER — AZTREONAM 1 G IJ SOLR
500.0000 mg | INTRAMUSCULAR | Status: DC
  Filled 2014-07-30: qty 1

## 2014-07-30 MED ORDER — ONDANSETRON HCL 4 MG/2ML IJ SOLN
4.0000 mg | Freq: Once | INTRAMUSCULAR | Status: AC
  Administered 2014-07-30: 4 mg via INTRAVENOUS
  Filled 2014-07-30: qty 2

## 2014-07-30 MED ORDER — METHOCARBAMOL 500 MG PO TABS
500.0000 mg | ORAL_TABLET | Freq: Three times a day (TID) | ORAL | Status: DC | PRN
  Administered 2014-08-01: 500 mg via ORAL
  Filled 2014-07-30 (×2): qty 1

## 2014-07-30 MED ORDER — ACETAMINOPHEN 325 MG PO TABS
650.0000 mg | ORAL_TABLET | ORAL | Status: DC | PRN
  Administered 2014-07-30: 650 mg via ORAL
  Filled 2014-07-30: qty 2

## 2014-07-30 MED ORDER — HEPARIN SODIUM (PORCINE) 5000 UNIT/ML IJ SOLN
5000.0000 [IU] | Freq: Three times a day (TID) | INTRAMUSCULAR | Status: DC
  Administered 2014-07-30 – 2014-08-02 (×9): 5000 [IU] via SUBCUTANEOUS
  Filled 2014-07-30 (×11): qty 1

## 2014-07-30 MED ORDER — DEXTROSE 5 % IV SOLN
500.0000 mg | Freq: Three times a day (TID) | INTRAVENOUS | Status: DC
  Administered 2014-07-30 (×2): 500 mg via INTRAVENOUS
  Filled 2014-07-30 (×4): qty 0.5

## 2014-07-30 NOTE — Progress Notes (Signed)
Utilization Review Completed.Donne Anon T6/03/2014

## 2014-07-30 NOTE — ED Notes (Signed)
Attempted to call report

## 2014-07-30 NOTE — ED Notes (Signed)
Pt c/o left lower back pain onset yesterday. Also reports productive cough x 1 week with yellow green sputum. Denies previous back injury

## 2014-07-30 NOTE — Progress Notes (Signed)
ANTIBIOTIC CONSULT NOTE - INITIAL  Pharmacy Consult for Levofloxacin & Azactam Indication: rule out pneumonia  Allergies  Allergen Reactions  . Augmentin [Amoxicillin-Pot Clavulanate] Other (See Comments)    unknown  . Bactrim [Sulfamethoxazole-Trimethoprim]     Renal failure  . Banana   . Cauliflower [Brassica Oleracea Italica] Other (See Comments)    Pt states his throat closes up  . Fish Allergy Hives  . Vancomycin     Patient states vancomycin caused kidney injury  . Onion Rash    Patient Measurements: Height: 5\' 7"  (170.2 cm) Weight: 185 lb (83.915 kg) IBW/kg (Calculated) : 66.1  Vital Signs: Temp: 101.1 F (38.4 C) (06/02 0625) Temp Source: Oral (06/02 0625) BP: 150/87 mmHg (06/02 0600) Pulse Rate: 114 (06/02 0600) Intake/Output from previous day: 06/01 0701 - 06/02 0700 In: 1000 [I.V.:1000] Out: -  Intake/Output from this shift:    Labs:  Recent Labs  07/30/14 0350  WBC 11.5*  HGB 10.9*  PLT 139*  CREATININE 6.70*   Estimated Creatinine Clearance: 16.5 mL/min (by C-G formula based on Cr of 6.7). No results for input(s): VANCOTROUGH, VANCOPEAK, VANCORANDOM, GENTTROUGH, GENTPEAK, GENTRANDOM, TOBRATROUGH, TOBRAPEAK, TOBRARND, AMIKACINPEAK, AMIKACINTROU, AMIKACIN in the last 72 hours.   Microbiology: No results found for this or any previous visit (from the past 720 hour(s)).  Medical History: Past Medical History  Diagnosis Date  . Shingles < 2010  . HIV infection dx'd 10/2013  . Cellulitis 11/2013    LLE  . CAP (community acquired pneumonia) 10/2013  . DVT (deep venous thrombosis) 10/2013    BLE  . Multiple food allergies   . Multiple environmental allergies     "trees, dogs, cats"  . Chronic kidney disease   . 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 acetaminophen (TYLENOL) 500 MG tablet Take 1,000 mg by mouth every 6 (six) hours as needed for  moderate pain, fever or headache.   07/29/2014 at Unknown time  . albuterol (PROVENTIL HFA;VENTOLIN HFA) 108 (90 BASE) MCG/ACT inhaler Inhale 2 puffs into the lungs every 6 (six) hours as needed for wheezing or shortness of breath.   07/29/2014 at Unknown time  . dapsone 100 MG tablet Take 1 tablet (100 mg total) by mouth daily. (Patient not taking: Reported on 02/04/2014) 30 tablet 0 Not Taking at Unknown time  . EPIVIR 10 MG/ML solution TAKE 10 ML BY MOUTH DAILY (Patient not taking: Reported on 07/30/2014) 240 mL 2 Not Taking at Unknown time  . furosemide (LASIX) 40 MG tablet Take 1 tablet (40 mg total) by mouth 2 (two) times daily. (Patient not taking: Reported on 07/30/2014) 60 tablet 0 Not Taking at Unknown time  . lamiVUDine (EPIVIR) 150 MG tablet Take 1 tablet (150 mg total) by mouth daily. (Patient not taking: Reported on 02/04/2014) 30 tablet 0 Not Taking at Unknown time  . raltegravir (ISENTRESS) 400 MG tablet Take 1 tablet (400 mg total) by mouth 2 (two) times daily. (Patient not taking: Reported on 02/04/2014) 180 tablet 3 Not Taking at Unknown time  . Rivaroxaban (XARELTO STARTER PACK) 15 & 20 MG TBPK Take as directed on package: Start with one 15mg  tablet by mouth twice a day with food. On Day 22, switch to one 20mg  tablet once a day with food. (Patient not taking: Reported on 07/30/2014) 51 each 0 Not Taking at Unknown time  . ZIAGEN 300 MG tablet TAKE 2 TABLETS BY MOUTH DAILY (  Patient not taking: Reported on 02/04/2014) 60 tablet 2 Not Taking at Unknown time   Assessment: 34 yo M admitted 07/30/2014 with SOB and productive cough.  Pharmacy consulted to dose levofloxacin and aztreonam.  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   Thank you  for allowing pharmacy to be a part of this patients care team.  Rowe Robert Pharm.D., BCPS, AQ-Cardiology Clinical Pharmacist 07/30/2014 10:53 AM Pager: [phone] Phone: [phone]

## 2014-07-30 NOTE — Progress Notes (Signed)
ANTIBIOTIC CONSULT NOTE - FOLLOW UP  Pharmacy Consult for Rocephin/Azithromycin Indication: pneumonia  Allergies  Allergen Reactions  . Augmentin [Amoxicillin-Pot Clavulanate] Other (See Comments)    unknown  . Bactrim [Sulfamethoxazole-Trimethoprim]     Renal failure  . Banana   . Cauliflower [Brassica Oleracea Italica] Other (See Comments)    Pt states his throat closes up  . Fish Allergy Hives  . Vancomycin     Patient states vancomycin caused kidney injury  . Onion Rash    Patient Measurements: Height: 5\' 7"  (170.2 cm) Weight: 185 lb (83.915 kg) IBW/kg (Calculated) : 66.1  Vital Signs: Temp: 98.8 F (37.1 C) (06/02 1314) Temp Source: Oral (06/02 1314) BP: 162/98 mmHg (06/02 1314) Pulse Rate: 101 (06/02 1314) Intake/Output from previous day: 06/01 0701 - 06/02 0700 In: 1000 [I.V.:1000] Out: -  Intake/Output from this shift: Total I/O In: 240 [P.O.:240] Out: -   Labs:  Recent Labs  07/30/14 0350  WBC 11.5*  HGB 10.9*  PLT 139*  CREATININE 6.70*   Estimated Creatinine Clearance: 16.5 mL/min (by C-G formula based on Cr of 6.7). No results for input(s): VANCOTROUGH, VANCOPEAK, VANCORANDOM, GENTTROUGH, GENTPEAK, GENTRANDOM, TOBRATROUGH, TOBRAPEAK, TOBRARND, AMIKACINPEAK, AMIKACINTROU, AMIKACIN in the last 72 hours.   Microbiology: No results found for this or any previous visit (from the past 720 hour(s)).  Anti-infectives    Start     Dose/Rate Route Frequency Ordered Stop   08/01/14 0530  levofloxacin (LEVAQUIN) IVPB 500 mg  Status:  Discontinued     500 mg 100 mL/hr over 60 Minutes Intravenous Every 24 hours 07/30/14 0809 07/30/14 0833   08/01/14 0530  levofloxacin (LEVAQUIN) IVPB 500 mg  Status:  Discontinued     500 mg 100 mL/hr over 60 Minutes Intravenous Every 48 hours 07/30/14 0833 07/30/14 1457   07/31/14 1000  lamiVUDine (EPIVIR) 10 MG/ML solution 100 mg     100 mg Oral Daily 07/30/14 1509     07/30/14 1600  raltegravir (ISENTRESS) tablet 400  mg     400 mg Oral 2 times daily 07/30/14 1452     07/30/14 1600  abacavir (ZIAGEN) tablet 600 mg     600 mg Oral Daily 07/30/14 1452     07/30/14 1600  dapsone tablet 100 mg     100 mg Oral Daily 07/30/14 1452     07/30/14 1515  lamiVUDine (EPIVIR) tablet 150 mg     150 mg Oral  Once 07/30/14 1509     07/30/14 1000  doxycycline (VIBRA-TABS) tablet 100 mg  Status:  Discontinued     100 mg Oral Every 12 hours 07/30/14 0809 07/30/14 0829   07/30/14 1000  aztreonam (AZACTAM) injection 500 mg  Status:  Discontinued     500 mg Intramuscular Every 24 hours 07/30/14 0903 07/30/14 0916   07/30/14 0930  aztreonam (AZACTAM) 500 mg in dextrose 5 % 50 mL IVPB  Status:  Discontinued     500 mg 100 mL/hr over 30 Minutes Intravenous 3 times per day 07/30/14 0916 07/30/14 1457   07/30/14 0500  levofloxacin (LEVAQUIN) IVPB 750 mg     750 mg 100 mL/hr over 90 Minutes Intravenous  Once 07/30/14 0459 07/30/14 0700      Assessment: 34 yo M admitted 07/30/2014 with SOB and productive cough.  Pt originally ordered Levaquin and Aztreonam for CAP.  Pt has been seen by ID and antibiotics changed to Rocephin and Azithromycin for CAP.  Pt has received adequate coverage for CAP today with the  Levaquin dose.  Will start new antibiotics 6/3.  Goal of Therapy:  Renal Adjustment of Medications  Plan:  Rocephin 1gm IV q24h - start 6/3 Azithromycin 500mg  IV q24h - start 6/3  [place], Pharm.D., BCPS Clinical Pharmacist Pager [phone] 07/30/2014 3:17 PM

## 2014-07-30 NOTE — Consult Note (Signed)
[facility] Infectious Disease Consult Note  Date: 07/30/2014               Patient Name:  Jim Wyatt MRN: [id number]  DOB: 03-09-1980 Age / Sex: 34 y.o., male   PCP: [name], MD         Requesting Physician: Dr. Barton Dubois, MD    Consulting Reason:  AIDS, non-compliance with HAART/OIppx      Chief Complaint:   History of Present Illness: Mr. Tumlinson is a 34 year old male with PMH of HIV/AIDS (CD4 - 80 Oct 2015, VL [id number] Sept 2015) newly diagnosed during September 2015 admission (he stopped HAART six months ago due to cost of meds), FSGS/HIVAN (biopsy proven), hx of B/L DVT (he stopped Xarelto six months ago due to nausea) here with c/o acute onset low back pain since yesterday morning that started while he was coughing.  He denies preceding trauma or overuse but did have recent four hour long car ride to [facility].  The pain is constant but of varying intensity.  He also notes f/c with temp of 100.69F at home a few days ago.  He reports cough x 1 week with associated dyspnea but says he thought he was just getting a cold.  The cough is productive of yellow sputum.  He denies headache, neck pain, change in vision or hearing, hemoptysis, N/V, decreased appetite, abdominal pain, diarrhea, leg pain or swelling.  He has continued to work and go to school.  There are sick contacts at work (with sinus infection symptoms).    He was febrile to 102.66F in the ED with leukocytosis (11.5) and CXR concerning for PNA.  He was started on Levaquin.  ID has been consulted due to AIDS, non-compliance with HAART and to initiate HAART/OIppx.  Meds: Current Facility-Administered Medications  Medication Dose Route Frequency Provider Last Rate Last Dose  . 0.9 %  sodium chloride infusion   Intravenous Continuous Barton Dubois, MD      . acetaminophen (TYLENOL) tablet 650 mg  650 mg Oral Q6H PRN Barton Dubois, MD      . budesonide (PULMICORT) nebulizer solution 0.25 mg  0.25 mg Nebulization BID Barton Dubois, MD      . guaiFENesin ([place]) 12 hr tablet 600 mg  600 mg Oral BID Barton Dubois, MD      . heparin injection 5,000 Units  5,000 Units Subcutaneous 3 times per day Barton Dubois, MD      . hydrALAZINE (APRESOLINE) injection 5 mg  5 mg Intravenous Q6H PRN Barton Dubois, MD      . ipratropium-albuterol (DUONEB) 0.5-2.5 (3) MG/3ML nebulizer solution 3 mL  3 mL Nebulization Q4H PRN Barton Dubois, MD      . Derrill Memo ON 08/01/2014] levofloxacin (LEVAQUIN) IVPB 500 mg  500 mg Intravenous Q48H Barton Dubois, MD      . methocarbamol (ROBAXIN) tablet 500 mg  500 mg Oral Q8H PRN Barton Dubois, MD      . ondansetron [facility]) injection 4 mg  4 mg Intravenous Q6H PRN Barton Dubois, MD      . sodium bicarbonate tablet 650 mg  650 mg Oral BID Barton Dubois, MD        Allergies: Allergies as of 07/30/2014 - Review Complete 07/30/2014  Allergen Reaction Noted  . Augmentin [amoxicillin-pot clavulanate] Other (See Comments) 09/03/2013  . Bactrim [sulfamethoxazole-trimethoprim]  11/14/2013  . Banana  11/09/2013  . Cauliflower [brassica oleracea italica] Other (See Comments) 11/10/2013  . Fish allergy  Hives 12/04/2013  . Vancomycin  12/04/2013  . Onion Rash 12/04/2013   Past Medical History  Diagnosis Date  . Shingles < 2010  . HIV infection dx'd 10/2013  . Cellulitis 11/2013    LLE  . CAP (community acquired pneumonia) 10/2013  . DVT (deep venous thrombosis) 10/2013    BLE  . Multiple food allergies   . Multiple environmental allergies     "trees, dogs, cats"  . Chronic kidney disease   . 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 Femur fracture surgery Right 09/2001  . Fracture surgery     History reviewed. No pertinent family history. History   Social History  . Marital Status: Single    Spouse Name: N/A  . Number of Children: N/A  . Years of Education: N/A   Occupational History  . Not on file.   Social History Main Topics  .  Smoking status: Never Smoker   . Smokeless tobacco: Never Used  . Alcohol Use: Yes     Comment: 12/04/2013 "might drink a couple times/yr"  . Drug Use: No  . Sexual Activity:    Partners: Female   Other Topics Concern  . Not on file   Social History Narrative    Review of Systems: General:  + fever/chills; denies decreased appetite HEENT:  Denies headache, neck pain, change in vision or hearing, sinus pain Cardiopulm:  Denies chest pain, palpitations; + cough with cough-associated dyspnea GI:  Denies N/V, abdominal pain, diarrhea or constipation GU: + foamy urine (unchanged from prior); denies decreased UOP, dysuria, hematuria   Physical Exam: Blood pressure 150/87, pulse 114, temperature 101.1 F (38.4 C), temperature source Oral, resp. rate 23, height 5\' 7"  (1.702 m), weight 185 lb (83.915 kg), SpO2 96 %.   General: sitting up in bed in NAD; non-toxic appearing HEENT: [place]/AT Cardiac: RRR, no rubs, murmurs or gallops Pulm: clear to auscultation bilaterally, moving normal volumes of air; able to sit up independently for lung exam Abd: soft, nontender, nondistended, BS present Ext: warm and well perfused, no pedal edema MSK:  TTP of lumbar spine and left paraspinal musculature; he is able to sit up and move around independently Neuro: alert and oriented X3, responding appropriately, MAE spontaneously  Lab results: Basic Metabolic Panel:  Recent Labs  07/30/14 0350  NA 130*  K 5.0  CL 103  CO2 18*  GLUCOSE 93  BUN 44*  CREATININE 6.70*  CALCIUM 8.4*   Liver Function Tests:  Recent Labs  07/30/14 0350  AST 19  ALT 18  ALKPHOS 50  BILITOT 0.5  PROT 9.1*  ALBUMIN 2.7*   CBC:  Recent Labs  07/30/14 0350  WBC 11.5*  NEUTROABS 8.9*  HGB 10.9*  HCT 31.9*  MCV 79.4  PLT 139*   Cardiac Enzymes:  Recent Labs  07/30/14 0350  TROPONINI <0.03   Urinalysis:  Recent Labs  07/30/14 0440  COLORURINE YELLOW  LABSPEC 1.011  PHURINE 7.0  GLUCOSEU  NEGATIVE  HGBUR LARGE*  BILIRUBINUR NEGATIVE  KETONESUR NEGATIVE  PROTEINUR >300*  UROBILINOGEN 0.2  NITRITE NEGATIVE  LEUKOCYTESUR NEGATIVE   Imaging results:  Dg Chest 2 View  07/30/2014   CLINICAL DATA:  Cough and bronchitis.  Back pain.  EXAM: CHEST  2 VIEW  COMPARISON:  12/04/2013  FINDINGS: Patchy airspace opacity in the right upper lobe concerning for pneumonia. Minimal ill-defined left lobe opacity, atelectasis or pneumonia. The heart size and mediastinal contours are normal. There  is no pleural effusion or pneumothorax. Pulmonary vasculature is normal. No acute osseous abnormality is seen.  IMPRESSION: Patchy right upper lobe pneumonia. Atelectasis versus pneumonia in the left lower lobe.   Electronically Signed   By: Jeb Levering M.D.   On: 07/30/2014 04:34   Antibiotic day: 1 Aztreonam two doses on 06/02 (stopped) Levaquin one dose on 06/02 (stopped)  Azithromycin day 0 (to start 06/03) Ceftriaxone day 0 (to start 06/03) Dapsone day 1 (start 06/02)  Assessment, Plan, & Recommendations by Problem: Sepsis due to CAP:  Fever, leukocytosis and RUL opacity in this community dwelling patient.  His last CD4 was 80 but CXR is not c/w PCP.   - d/c levaquin and aztreonam - START CAP coverage with azithromycin and ceftriaxone - trend WBC/fever curve with trx - follow-up blood cx results  Acquired immune deficiency syndrome:  Non-compliant with HAART for the past six months because he cannot afford medications.   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  Pain varies in degree of intensity, not severe on my exam.  He denies drug use hx, no recent trauma, no gross neuro deficits to suggest spinal/disc infection.  There is mild disc space flattening at L5-S1 on lumbar xray.  I think it would be unlikely to have both a lung and spine pathology and since we have lung source will treat as above for CAP.  Primary team has started Robaxin for muscle spasm.  If back pain does not respond to conservative treatment or if fever and leukocytosis persists despite CAP treatment, will need to consider re-imaging with non-contrast CT or MRI (given his HIVAN).  Signed: Francesca Oman, DO IMTS on ID Consult Service 07/30/2014, 8:58 AM

## 2014-07-30 NOTE — ED Notes (Addendum)
Pt c/o cough x 1 week and lower back pain. Denies injury to lower back or history. Pt endorses "issues with my left leg." Denies shooting pain down L leg. Per family at bedside, pt attended bike week and was riding in the rain. Pt has been having productive cough since.

## 2014-07-30 NOTE — ED Notes (Signed)
Pt c/o nausea with morphine; provided cold wash cloth and zofran given

## 2014-07-30 NOTE — Consult Note (Signed)
34 yo M with dx HIV/AIDS 10-2013. He was suspected to have PCP at that time. 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  He was started on antiretroviral therapy.. He was treated with clinda/primaquine then changed to dapsone.  He returned to hospital on 10-8 with BLE DVTs and was treated with coumadin which has subsequently been discontinued in January. He is followed by Dr. Ryan Sanders at [facility] for the FSGS.  On Dec 08, 2013 creat was 3.05mg/dl.  On January 07, 2014 creat was down to 1.65mg/dl and albumin 2.7gm. At that time he was started on losartan 25mg daily with plans for repeat labs 7 days later. (He cannot recall if he actually took the losartan.) He never had the labs done. He presented to the hospital today with back pain after coughing this morning with no fx but mild disc flattening at L5-S1, and CXR showed ?RUL PNA and LLL infx. Creatinine was 6.7 and renal ultrasound revealed more echogenic kidneys with reduction in size(right 11.7cm, left 11.0cm) when compared with September 2015.  He denies uremic symptoms.  He otherwise feels well.  Past Medical History  Diagnosis Date  . Shingles < 2010  . HIV infection dx'd 10/2013  . Cellulitis 11/2013    LLE  . CAP (community acquired pneumonia) 10/2013  . DVT (deep venous thrombosis) 10/2013    BLE  . Multiple food allergies   . Multiple environmental allergies     "trees, dogs, cats"  . Chronic kidney disease   . 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 Femur fracture surgery Right 09/2001  . Fracture surgery     Social History:  reports that he has never smoked. He has never used smokeless tobacco. He reports that he drinks alcohol. He reports that he does not use illicit drugs. Allergies:  Allergies  Allergen Reactions  . Augmentin [Amoxicillin-Pot Clavulanate]  Other (See Comments)    unknown  . Bactrim [Sulfamethoxazole-Trimethoprim]     Renal failure  . Banana   . Cauliflower [Brassica Oleracea Italica] Other (See Comments)    Pt states his throat closes up  . Fish Allergy Hives  . Vancomycin     Patient states vancomycin caused kidney injury  . Onion Rash   History reviewed. No pertinent family history.  Medications:  Prior to Admission:  Prescriptions prior to admission  Medication Sig Dispense Refill Last Dose  . acetaminophen (TYLENOL) 500 MG tablet Take 1,000 mg by mouth every 6 (six) hours as needed for moderate pain, fever or headache.   07/29/2014 at Unknown time  . albuterol (PROVENTIL HFA;VENTOLIN HFA) 108 (90 BASE) MCG/ACT inhaler Inhale 2 puffs into the lungs every 6 (six) hours as needed for wheezing or shortness of breath.   07/29/2014 at Unknown time  . dapsone 100 MG tablet Take 1 tablet (100 mg total) by mouth daily. (Patient not taking: Reported on 02/04/2014) 30 tablet 0 Not Taking at Unknown time  . EPIVIR 10 MG/ML solution TAKE 10 ML BY MOUTH DAILY (Patient not taking: Reported on 07/30/2014) 240 mL 2 Not Taking at Unknown time  . furosemide (LASIX) 40 MG tablet Take 1 tablet (40 mg total) by mouth 2 (two) times daily. (Patient not taking: Reported on 07/30/2014) 60 tablet 0 Not Taking at Unknown time  . lamiVUDine (EPIVIR) 150 MG tablet Take 1   tablet (150 mg total) by mouth daily. (Patient not taking: Reported on 02/04/2014) 30 tablet 0 Not Taking at Unknown time  . raltegravir (ISENTRESS) 400 MG tablet Take 1 tablet (400 mg total) by mouth 2 (two) times daily. (Patient not taking: Reported on 02/04/2014) 180 tablet 3 Not Taking at Unknown time  . Rivaroxaban (XARELTO STARTER PACK) 15 & 20 MG TBPK Take as directed on package: Start with one 15mg tablet by mouth twice a day with food. On Day 22, switch to one 20mg tablet once a day with food. (Patient not taking: Reported on 07/30/2014) 51 each 0 Not Taking at Unknown time  . ZIAGEN 300  MG tablet TAKE 2 TABLETS BY MOUTH DAILY (Patient not taking: Reported on 02/04/2014) 60 tablet 2 Not Taking at Unknown time   Scheduled: . abacavir  600 mg Oral Daily  . [START ON 07/31/2014] azithromycin  500 mg Intravenous Q24H  . budesonide (PULMICORT) nebulizer solution  0.25 mg Nebulization BID  . [START ON 07/31/2014] cefTRIAXone (ROCEPHIN)  IV  1 g Intravenous Q24H  . dapsone  100 mg Oral Daily  . guaiFENesin  600 mg Oral BID  . heparin  5,000 Units Subcutaneous 3 times per day  . [START ON 07/31/2014] lamiVUDine  100 mg Oral Daily  . lamiVUDine  150 mg Oral Once  . raltegravir  400 mg Oral BID  . sodium bicarbonate  650 mg Oral BID    ROS: Back pain and minimal cough otherwise doing well. ROS otherwise negative  Blood pressure 162/98, pulse 101, temperature 98.8 F (37.1 C), temperature source Oral, resp. rate 20, height 5' 7" (1.702 m), weight 83.915 kg (185 lb), SpO2 100 %.  General appearance: alert and cooperative Head: Normocephalic, without obvious abnormality, atraumatic Eyes: negative Ears: Normal Nose: Nares normal. Septum midline. Mucosa normal. No drainage or sinus tenderness. Throat: lips, mucosa, and tongue normal; teeth and gums normal Resp: clear to auscultation bilaterally Chest wall: no tenderness Cardio: regular rate and rhythm, S1, S2 normal, no murmur, click, rub or gallop right flank is tender, but no CVA tenderness GI: soft, non-tender; bowel sounds normal; no masses,  no organomegaly Extremities: extremities normal, atraumatic, no cyanosis or edema Skin: Skin color, texture, turgor normal. No rashes or lesions Neurologic: Grossly normal Results for orders placed or performed during the hospital encounter of 07/30/14 (from the past 48 hour(s))  CBC with Differential/Platelet     Status: Abnormal   Collection Time: 07/30/14  3:50 AM  Result Value Ref Range   WBC 11.5 (H) 4.0 - 10.5 K/uL   RBC 4.02 (L) 4.22 - 5.81 MIL/uL   Hemoglobin 10.9 (L) 13.0 - 17.0  g/dL   HCT 31.9 (L) 39.0 - 52.0 %   MCV 79.4 78.0 - 100.0 fL   MCH 27.1 26.0 - 34.0 pg   MCHC 34.2 30.0 - 36.0 g/dL   RDW 13.4 11.5 - 15.5 %   Platelets 139 (L) 150 - 400 K/uL   Neutrophils Relative % 77 43 - 77 %   Neutro Abs 8.9 (H) 1.7 - 7.7 K/uL   Lymphocytes Relative 16 12 - 46 %   Lymphs Abs 1.8 0.7 - 4.0 K/uL   Monocytes Relative 6 3 - 12 %   Monocytes Absolute 0.7 0.1 - 1.0 K/uL   Eosinophils Relative 1 0 - 5 %   Eosinophils Absolute 0.2 0.0 - 0.7 K/uL   Basophils Relative 0 0 - 1 %   Basophils Absolute 0.0 0.0 - 0.1   K/uL  Comprehensive metabolic panel     Status: Abnormal   Collection Time: 07/30/14  3:50 AM  Result Value Ref Range   Sodium 130 (L) 135 - 145 mmol/L   Potassium 5.0 3.5 - 5.1 mmol/L   Chloride 103 101 - 111 mmol/L   CO2 18 (L) 22 - 32 mmol/L   Glucose, Bld 93 65 - 99 mg/dL   BUN 44 (H) 6 - 20 mg/dL   Creatinine, Ser 6.70 (H) 0.61 - 1.24 mg/dL   Calcium 8.4 (L) 8.9 - 10.3 mg/dL   Total Protein 9.1 (H) 6.5 - 8.1 g/dL   Albumin 2.7 (L) 3.5 - 5.0 g/dL   AST 19 15 - 41 U/L   ALT 18 17 - 63 U/L   Alkaline Phosphatase 50 38 - 126 U/L   Total Bilirubin 0.5 0.3 - 1.2 mg/dL   GFR calc non Af Amer 10 (L) >60 mL/min   GFR calc Af Amer 11 (L) >60 mL/min    Comment: (NOTE) The eGFR has been calculated using the CKD EPI equation. This calculation has not been validated in all clinical situations. eGFR's persistently <60 mL/min signify possible Chronic Kidney Disease.    Anion gap 9 5 - 15  Brain natriuretic peptide     Status: None   Collection Time: 07/30/14  3:50 AM  Result Value Ref Range   B Natriuretic Peptide 28.7 0.0 - 100.0 pg/mL  Troponin I     Status: None   Collection Time: 07/30/14  3:50 AM  Result Value Ref Range   Troponin I <0.03 <0.031 ng/mL    Comment:        NO INDICATION OF MYOCARDIAL INJURY.   I-Stat CG4 Lactic Acid, ED     Status: None   Collection Time: 07/30/14  3:54 AM  Result Value Ref Range   Lactic Acid, Venous 0.74 0.5 -  2.0 mmol/L  Urinalysis, Routine w reflex microscopic (not at [facility])     Status: Abnormal   Collection Time: 07/30/14  4:40 AM  Result Value Ref Range   Color, Urine YELLOW YELLOW   APPearance CLEAR CLEAR   Specific Gravity, Urine 1.011 1.005 - 1.030   pH 7.0 5.0 - 8.0   Glucose, UA NEGATIVE NEGATIVE mg/dL   Hgb urine dipstick LARGE (A) NEGATIVE   Bilirubin Urine NEGATIVE NEGATIVE   Ketones, ur NEGATIVE NEGATIVE mg/dL   Protein, ur >300 (A) NEGATIVE mg/dL   Urobilinogen, UA 0.2 0.0 - 1.0 mg/dL   Nitrite NEGATIVE NEGATIVE   Leukocytes, UA NEGATIVE NEGATIVE  Urine microscopic-add on     Status: None   Collection Time: 07/30/14  4:40 AM  Result Value Ref Range   Squamous Epithelial / LPF RARE RARE   WBC, UA 0-2 <3 WBC/hpf   RBC / HPF 11-20 <3 RBC/hpf   Bacteria, UA RARE RARE  Magnesium     Status: Abnormal   Collection Time: 07/30/14  8:50 AM  Result Value Ref Range   Magnesium 1.6 (L) 1.7 - 2.4 mg/dL  Phosphorus     Status: None   Collection Time: 07/30/14  8:50 AM  Result Value Ref Range   Phosphorus 3.6 2.5 - 4.6 mg/dL  Save smear     Status: None   Collection Time: 07/30/14  8:50 AM  Result Value Ref Range   Smear Review SMEAR STAINED AND AVAILABLE FOR REVIEW   Strep pneumoniae urinary antigen     Status: None   Collection Time: 07/30/14  2:05   PM  Result Value Ref Range   Strep Pneumo Urinary Antigen NEGATIVE NEGATIVE    Comment:        Infection due to S. pneumoniae cannot be absolutely ruled out since the antigen present may be below the detection limit of the test.    Dg Chest 2 View  07/30/2014   CLINICAL DATA:  Cough and bronchitis.  Back pain.  EXAM: CHEST  2 VIEW  COMPARISON:  12/04/2013  FINDINGS: Patchy airspace opacity in the right upper lobe concerning for pneumonia. Minimal ill-defined left lobe opacity, atelectasis or pneumonia. The heart size and mediastinal contours are normal. There is no pleural effusion or pneumothorax. Pulmonary vasculature is normal.  No acute osseous abnormality is seen.  IMPRESSION: Patchy right upper lobe pneumonia. Atelectasis versus pneumonia in the left lower lobe.   Electronically Signed   By: Jeb Levering M.D.   On: 07/30/2014 04:34   Dg Lumbar Spine 2-3 Views  07/30/2014   CLINICAL DATA:  Low back pain from yesterday, no known injury  EXAM: LUMBAR SPINE - 2-3 VIEW  COMPARISON:  None.  FINDINGS: Three views of lumbar spine submitted. No acute fracture or subluxation. Alignment and vertebral body heights are preserved. Mild disc space flattening at L5-S1 level.  IMPRESSION: No acute fracture or subluxation. Mild disc space flattening at L5-S1 level.   Electronically Signed   By: Lahoma Crocker M.D.   On: 07/30/2014 11:14   US Renal  07/30/2014   CLINICAL DATA:  Acute on chronic renal failure.  EXAM: RENAL / URINARY TRACT ULTRASOUND COMPLETE  COMPARISON:  11/15/2013  FINDINGS: Right Kidney:  Length: 11.7 cm slightly decreased in size from prior exam. Marked increased cortical echogenicity slightly worse. No mass or hydronephrosis visualized.  Left Kidney:  Length: 11.0 cm slightly decreased in size from prior exam. Marked increased cortical echogenicity slightly worse. No mass or hydronephrosis visualized.  Bladder:  Appears normal for degree of bladder distention.  IMPRESSION: Slight decrease in size of kidneys compared to the prior exam with slight progression of marked increased cortical echogenicity bilaterally compatible with significant medical renal disease.   Electronically Signed   By: Marin Olp M.D.   On: 07/30/2014 11:09    Assessment:  1 Collapsing FSGS due HIVAN 2 AKI possibly medication related(due to ARB), less likely progression of CKD  due to aggressive nature of HIVAN FSGS or renal vein thrombosis 3 Hypertension  Plan: 1) He will find out if he got the losartan 2) Begin education about dialysis 3) Start amlodipine for BP 4) Renal vein doppler or MRV if no improvement  Mikaela Hilgeman C 07/30/2014, 4:20  PM

## 2014-07-30 NOTE — H&P (Addendum)
[facility] History and Physical  CORDARREL REGALBUTO [id number] DOB: [birth year] DOA: 07/30/2014  Referring physician: Dr. Lita Mains  PCP: [name], MD   Chief Complaint: SOB, productive cough, Fever  HPI: Jim Wyatt is a 34 y.o. 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 Patient reports having cough and some intermittent SOB for the last week; he endorses symptoms continue worsening and for the last 2 days has been also having fever now. He endorses anorexia as well and decrease PO intake during this time. Patient denies CP, nausea, vomiting, HA's, hematuria, dysuria, melena, hematochezia, hemoptysis or any other complaints. In Ed he was found to have fever up to 102.3, elevated WBC's, infiltrates on CXR suggesting PNA, worsening renal function (Cr 6.7) and was tachycardic/tachypneic. Blood cx's taken and patient started on levaquin. [facility] called to admit patient for further evaluation and tx.   Review of Systems:  Positive for lower back pain, productive cough and fever; otherwise negative except as otherwise mentioned on HPI.  Past Medical History  Diagnosis Date  . Shingles < 2010  . HIV infection dx'd 10/2013  . Cellulitis 11/2013    LLE  . CAP (community acquired pneumonia) 10/2013  . DVT (deep venous thrombosis) 10/2013    BLE  . Multiple food allergies   . Multiple environmental allergies     "trees, dogs, cats"  . Chronic kidney disease   . 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 Femur fracture surgery Right 09/2001  . Fracture surgery     Social History:  reports that he has never smoked. He has never used smokeless tobacco. He reports that he drinks alcohol. He reports that he does not use illicit  drugs.  Allergies  Allergen Reactions  . Augmentin [Amoxicillin-Pot Clavulanate] Other (See Comments)    unknown  . Bactrim [Sulfamethoxazole-Trimethoprim]     Renal failure  . Banana   . Cauliflower [Brassica Oleracea Italica] Other (See Comments)    Pt states his throat closes up  . Fish Allergy Hives  . Vancomycin     Patient states vancomycin caused kidney injury  . Onion Rash    Family History: positive for HTN only  Prior to Admission medications   Medication Sig Start Date End Date Taking? Authorizing Provider  acetaminophen (TYLENOL) 500 MG tablet Take 1,000 mg by mouth every 6 (six) hours as needed for moderate pain, fever or headache.   Yes Historical Provider, MD  albuterol (PROVENTIL HFA;VENTOLIN HFA) 108 (90 BASE) MCG/ACT inhaler Inhale 2 puffs into the lungs every 6 (six) hours as needed for wheezing or shortness of breath.   Yes Historical Provider, MD  dapsone 100 MG tablet Take 1 tablet (100 mg total) by mouth daily. Patient not taking: Reported on 02/04/2014 12/08/13   Domenic Polite, MD  EPIVIR 10 MG/ML solution TAKE 10 ML BY MOUTH DAILY Patient not taking: Reported on 07/30/2014 12/29/13   Carlyle Basques, MD  furosemide (LASIX) 40 MG tablet Take 1 tablet (40 mg total) by mouth 2 (two) times daily. Patient not taking: Reported on 07/30/2014 01/21/14   [name], MD  lamiVUDine (EPIVIR) 150 MG tablet Take 1 tablet (150 mg total) by mouth daily. Patient not taking: Reported on 02/04/2014 12/08/13   Domenic Polite, MD  raltegravir (ISENTRESS) 400  MG tablet Take 1 tablet (400 mg total) by mouth 2 (two) times daily. Patient not taking: Reported on 02/04/2014 12/01/13   Campbell Riches, MD  Rivaroxaban (XARELTO STARTER PACK) 15 & 20 MG TBPK Take as directed on package: Start with one 15mg  tablet by mouth twice a day with food. On Day 22, switch to one 20mg  tablet once a day with food. Patient not taking: Reported on 07/30/2014 02/05/14   [name], MD   ZIAGEN 300 MG tablet TAKE 2 TABLETS BY MOUTH DAILY Patient not taking: Reported on 02/04/2014 12/29/13   Carlyle Basques, MD   Physical Exam: Filed Vitals:   07/30/14 0520 07/30/14 0530 07/30/14 0600 07/30/14 0625  BP: 157/96 156/85 150/87   Pulse: 118 114 114   Temp: 102.3 F (39.1 C)   101.1 F (38.4 C)  TempSrc: Oral   Oral  Resp: 24 22 23    Height:      Weight:      SpO2: 96% 97% 96%     Wt Readings from Last 3 Encounters:  07/30/14 83.915 kg (185 lb)  02/04/14 91.173 kg (201 lb)  12/09/13 81.647 kg (180 lb)    General:  Appears calm, AAOX3, no complaining of CP, no using accessory and with good O2 sat on RA at this moment. Patient able to follow commands and with just some complaints of lower back pain. Eyes: PERRL, normal lids, irises & conjunctiva, no icterus, no nystagmus ENT: grossly normal hearing, lips & tongue; no thrush, no erythema, no nystagmus, no exudates Neck: no LAD, masses or thyromegaly, no JVD Cardiovascular: tachycardia, no m/r/g. No LE edema. Respiratory: scattered wheezing and positive rhonchi; no crackles. Normal respiratory effort. Abdomen: soft, nt, nd, positive BS Skin: no rash or induration seen on limited exam Musculoskeletal: grossly normal tone BUE/BLE Psychiatric: grossly normal mood and affect, speech fluent and appropriate Neurologic: grossly non-focal.          Labs on Admission:  Basic Metabolic Panel:  Recent Labs Lab 07/30/14 0350  NA 130*  K 5.0  CL 103  CO2 18*  GLUCOSE 93  BUN 44*  CREATININE 6.70*  CALCIUM 8.4*   Liver Function Tests:  Recent Labs Lab 07/30/14 0350  AST 19  ALT 18  ALKPHOS 50  BILITOT 0.5  PROT 9.1*  ALBUMIN 2.7*   CBC:  Recent Labs Lab 07/30/14 0350  WBC 11.5*  NEUTROABS 8.9*  HGB 10.9*  HCT 31.9*  MCV 79.4  PLT 139*   Cardiac Enzymes:  Recent Labs Lab 07/30/14 0350  TROPONINI <0.03    BNP (last 3 results)  Recent Labs  07/30/14 0350  BNP 28.7    ProBNP (last 3  results)  Recent Labs  11/07/13 0855  PROBNP 33.5    CBG: No results for input(s): GLUCAP in the last 168 hours.  Radiological Exams on Admission: Dg Chest 2 View  07/30/2014   CLINICAL DATA:  Cough and bronchitis.  Back pain.  EXAM: CHEST  2 VIEW  COMPARISON:  12/04/2013  FINDINGS: Patchy airspace opacity in the right upper lobe concerning for pneumonia. Minimal ill-defined left lobe opacity, atelectasis or pneumonia. The heart size and mediastinal contours are normal. There is no pleural effusion or pneumothorax. Pulmonary vasculature is normal. No acute osseous abnormality is seen.  IMPRESSION: Patchy right upper lobe pneumonia. Atelectasis versus pneumonia in the left lower lobe.   Electronically Signed   By: Jeb Levering M.D.   On: 07/30/2014 04:34    EKG:  Sinus tachycardia, mild changes suggesting LVH; no acute ischemic abnormalities  Assessment/Plan 1-Sepsis: due to CAP. 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: related to his HIV status -Cr has double from last Cr on records (had CKD stage 4-5) -will hold lasix  -UA no suggesting UTI -will give fluid challenge, check renal US -follow renal function -given mild metabolic acidosis will start bicarb BID -renal service consultation  4-Metabolic acidosis: as mentioned above; will use bicarb tablet BID -follow BMET in  am -combination of CKD    5-Anemia of chronic disease: Hgb at 10 -will allow renal service to decide on epogen/iron therapy -no transfusion needed -will follow Hgb trend  6-Thrombocytopenia: due to sepsis most likely -will follow platelets trend -will check peripheral smear -no signs of active bleeding  7-elevated BP: no hx of HTN -will use PRN hydralazine for now  8-Lower back pain: presumed to be secondary to muscle spasm -will use some PRN robaxin -lumbar x-ray ordered; but not hx of injury   ID (Dr. Graylon Good) Renal Service (Dr. Posey Pronto)  Code Status: Full Code DVT Prophylaxis:heparin Family Communication: no family at bedside Disposition Plan: telemetry, LOS > 2 midnights; inpatient  Time spent: 70 minutes  [place], [facility] Pager [phone]

## 2014-07-30 NOTE — ED Notes (Signed)
Pt is HIV positive; has been noncompliant with HIV medications x 44months

## 2014-07-30 NOTE — ED Provider Notes (Signed)
CSN: [id number]     Arrival date & time 07/30/14  [id number] History  This chart was scribed for Jim Rice, MD by Chester Holstein, ED Scribe. This patient was seen in room A13C/A13C and the patient's care was started at 3:37 AM.     Chief Complaint  Patient presents with  . Back Pain  . Cough    The history is provided by the patient. No language interpreter was used.   HPI Comments: JECARYOUS Wyatt is a 34 y.o. male with PMHx of HIV, DVT, and CKD who presents to the Emergency Department complaining of productive cough with green sputum with onset 1 week ago worsening yesterday. Pt notes associated chills, fever, SOB, and low back pain with onset yesterday morning. Pt reports h/o DVT but states he is no longer taking Xarelto due to nausea. Pt has taken Tylenol for relief. Pt denies leg swelling and chest pain.   Past Medical History  Diagnosis Date  . Shingles < 2010  . HIV infection dx'd 10/2013  . Cellulitis 11/2013    LLE  . CAP (community acquired pneumonia) 10/2013  . DVT (deep venous thrombosis) 10/2013    BLE  . Multiple food allergies   . Multiple environmental allergies     "trees, dogs, cats"  . Chronic kidney disease   . 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 Femur fracture surgery Right 09/2001  . Fracture surgery     History reviewed. No pertinent family history. History  Substance Use Topics  . Smoking status: Never Smoker   . Smokeless tobacco: Never Used  . Alcohol Use: Yes     Comment: 12/04/2013 "might drink a couple times/yr"    Review of Systems  Constitutional: Positive for fever and chills.  HENT: Negative for congestion and sore throat.   Respiratory: Positive for cough and shortness of breath.   Cardiovascular: Negative for chest pain and leg swelling.  Gastrointestinal: Negative for vomiting, abdominal pain, diarrhea and constipation.  Musculoskeletal: Positive for myalgias and back pain.  Negative for neck pain and neck stiffness.  Skin: Negative for rash.  Neurological: Negative for dizziness, weakness, light-headedness, numbness and headaches.  All other systems reviewed and are negative.     Allergies  Augmentin; Bactrim; Banana; Cauliflower; Fish allergy; Vancomycin; and Onion  Home Medications   Prior to Admission medications   Medication Sig Start Date End Date Taking? Authorizing Provider  acetaminophen (TYLENOL) 500 MG tablet Take 1,000 mg by mouth every 6 (six) hours as needed for moderate pain, fever or headache.   Yes Historical Provider, MD  albuterol (PROVENTIL HFA;VENTOLIN HFA) 108 (90 BASE) MCG/ACT inhaler Inhale 2 puffs into the lungs every 6 (six) hours as needed for wheezing or shortness of breath.   Yes Historical Provider, MD  dapsone 100 MG tablet Take 1 tablet (100 mg total) by mouth daily. Patient not taking: Reported on 02/04/2014 12/08/13   Domenic Polite, MD  EPIVIR 10 MG/ML solution TAKE 10 ML BY MOUTH DAILY Patient not taking: Reported on 07/30/2014 12/29/13   Carlyle Basques, MD  furosemide (LASIX) 40 MG tablet Take 1 tablet (40 mg total) by mouth 2 (two) times daily. Patient not taking: Reported on 07/30/2014 01/21/14   [name], MD  lamiVUDine (EPIVIR) 150 MG tablet Take 1 tablet (150 mg total) by mouth daily. Patient not taking: Reported on 02/04/2014 12/08/13   Domenic Polite, MD  raltegravir (ISENTRESS) 400 MG tablet Take 1 tablet (400  mg total) by mouth 2 (two) times daily. Patient not taking: Reported on 02/04/2014 12/01/13   Campbell Riches, MD  Rivaroxaban (XARELTO STARTER PACK) 15 & 20 MG TBPK Take as directed on package: Start with one 15mg  tablet by mouth twice a day with food. On Day 22, switch to one 20mg  tablet once a day with food. Patient not taking: Reported on 07/30/2014 02/05/14   [name], MD  ZIAGEN 300 MG tablet TAKE 2 TABLETS BY MOUTH DAILY Patient not taking: Reported on 02/04/2014 12/29/13   Carlyle Basques,  MD   BP 157/96 mmHg  Pulse 118  Temp(Src) 102.3 F (39.1 C) (Oral)  Resp 24  Ht 5\' 7"  (1.702 m)  Wt 185 lb (83.915 kg)  BMI 28.97 kg/m2  SpO2 96% Physical Exam  Constitutional: He is oriented to person, place, and time. He appears well-developed and well-nourished. No distress.  HENT:  Head: Normocephalic and atraumatic.  Mouth/Throat: Oropharynx is clear and moist. No oropharyngeal exudate.  Eyes: EOM are normal. Pupils are equal, round, and reactive to light.  Neck: Normal range of motion. Neck supple.  No meningismus  Cardiovascular: Regular rhythm.  Exam reveals no gallop and no friction rub.   No murmur heard. Tachycardia  Pulmonary/Chest: Effort normal. No respiratory distress. He has no wheezes. He has rales (few scattered wheals especially in the bases).  Abdominal: Soft. Bowel sounds are normal. He exhibits no distension and no mass. There is no tenderness. There is no rebound and no guarding.  Musculoskeletal: Normal range of motion. He exhibits no edema or tenderness.  Tenderness to palpation over the left lumbar paraspinal muscles. No CVA tenderness. No midline tenderness.  Neurological: He is alert and oriented to person, place, and time.  5/5 motor in all extremities. Sensation is fully intact.  Skin: Skin is warm and dry. No rash noted. No erythema.  Psychiatric: He has a normal mood and affect. His behavior is normal.  Nursing note and vitals reviewed.   ED Course  Procedures (including critical care time) DIAGNOSTIC STUDIES: Oxygen Saturation is 98% on room air, normal by my interpretation.    COORDINATION OF CARE: 3:40 AM Discussed treatment plan with patient at beside, the patient agrees with the plan and has no further questions at this time.   Labs Review Labs Reviewed  CBC WITH DIFFERENTIAL/PLATELET - Abnormal; Notable for the following:    WBC 11.5 (*)    RBC 4.02 (*)    Hemoglobin 10.9 (*)    HCT 31.9 (*)    Platelets 139 (*)    Neutro Abs 8.9  (*)    All other components within normal limits  COMPREHENSIVE METABOLIC PANEL - Abnormal; Notable for the following:    Sodium 130 (*)    CO2 18 (*)    BUN 44 (*)    Creatinine, Ser 6.70 (*)    Calcium 8.4 (*)    Total Protein 9.1 (*)    Albumin 2.7 (*)    GFR calc non Af Amer 10 (*)    GFR calc Af Amer 11 (*)    All other components within normal limits  URINALYSIS, ROUTINE W REFLEX MICROSCOPIC (NOT AT [facility]) - Abnormal; Notable for the following:    Hgb urine dipstick LARGE (*)    Protein, ur >300 (*)    All other components within normal limits  CULTURE, BLOOD (ROUTINE X 2)  CULTURE, BLOOD (ROUTINE X 2)  TROPONIN I  URINE MICROSCOPIC-ADD ON  BRAIN NATRIURETIC PEPTIDE  I-STAT CG4 LACTIC ACID, ED    Imaging Review Dg Chest 2 View  07/30/2014   CLINICAL DATA:  Cough and bronchitis.  Back pain.  EXAM: CHEST  2 VIEW  COMPARISON:  12/04/2013  FINDINGS: Patchy airspace opacity in the right upper lobe concerning for pneumonia. Minimal ill-defined left lobe opacity, atelectasis or pneumonia. The heart size and mediastinal contours are normal. There is no pleural effusion or pneumothorax. Pulmonary vasculature is normal. No acute osseous abnormality is seen.  IMPRESSION: Patchy right upper lobe pneumonia. Atelectasis versus pneumonia in the left lower lobe.   Electronically Signed   By: Jeb Levering M.D.   On: 07/30/2014 04:34     EKG Interpretation None      MDM   Final diagnoses:  Community acquired pneumonia   I personally performed the services described in this documentation, which was scribed in my presence. The recorded information has been reviewed and is accurate.    Patient with improved symptoms after meds and IV fluids. Normal lactic acid level. Patient with multiple allergies to different antibiotics. Will start on Levaquin and have hospitalist add as see fit. Discussed with Dr. Alcario Drought. Will admit to telemetry bed.  Jim Rice, MD 07/30/14 [phone]

## 2014-07-30 NOTE — Evaluation (Signed)
Physical Therapy Evaluation Patient Details Name: Jim Wyatt MRN: [id number] DOB: 24-Dec-1980 Today's Date: 07/30/2014   History of Present Illness  Jim Wyatt is a 34 y.o. 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 Patient reports having cough and some intermittent SOB for the last week; he endorses symptoms continue worsening and for the last 2 days has been also having fever now.  Clinical Impression  Pt is at or close to baseline functioning and should be safe at home with available assist. There are no further acute PT needs and vitals/oxygen needs are being met on RA.  Will sign off at this time.     Follow Up Recommendations No PT follow up    Equipment Recommendations  None recommended by PT    Recommendations for Other Services       Precautions / Restrictions Precautions Precautions: None      Mobility  Bed Mobility Overal bed mobility: Independent                Transfers Overall transfer level: Independent                  Ambulation/Gait Ambulation/Gait assistance: Independent Ambulation Distance (Feet): 400 Feet Assistive device: None Gait Pattern/deviations: WFL(Within Functional Limits) Gait velocity: can speed up appreciably to at or above age appropriate levels Gait velocity interpretation: at or above normal speed for age/gender General Gait Details: [facility] and age appropriate.  No overt deviation/LOB with higher level balance challenges incl. 360* scanning, abrupt direction changes/start stops, speed changes, stepping over and around obstacles, and stairs without UE assist.  Stairs Stairs: Yes Stairs assistance: Independent Stair Management: No rails;Alternating pattern Number of Stairs: 4    Wheelchair Mobility    Modified Rankin (Stroke  Patients Only)       Balance Overall balance assessment: Independent                                           Pertinent Vitals/Pain Pain Assessment: No/denies pain    Home Living Family/patient expects to be discharged to:: Private residence Living Arrangements: Alone Available Help at Discharge: Friend(s);Available PRN/intermittently Type of Home: House Home Access: Stairs to enter Entrance Stairs-Rails: None Entrance Stairs-Number of Steps: 1 Home Layout: One level Home Equipment: Cane - single point      Prior Function Level of Independence: Independent with assistive device(s)         Comments: Using cane pta due to pain in LE's.  Otherwise independent pta.     Hand Dominance        Extremity/Trunk Assessment   Upper Extremity Assessment: Overall WFL for tasks assessed           Lower Extremity Assessment: Overall WFL for tasks assessed      Cervical / Trunk Assessment: Normal  Communication   Communication: No difficulties  Cognition Arousal/Alertness: Awake/alert Behavior During Therapy: WFL for tasks assessed/performed Overall Cognitive Status: Within Functional Limits for tasks assessed                      General Comments      Exercises        Assessment/Plan    PT Assessment  Patent does not need any further PT services  PT Diagnosis     PT Problem List    PT Treatment Interventions     PT Goals (Current goals can be found in the Care Plan section) Acute Rehab PT Goals PT Goal Formulation: All assessment and education complete, DC therapy    Frequency     Barriers to discharge        Co-evaluation               End of Session   Activity Tolerance: Patient tolerated treatment well Patient left: Other (comment) (independent in room and halls) Nurse Communication: Mobility status         Time: 1443-2469 PT Time Calculation (min) (ACUTE ONLY): 16 min   Charges:   PT  Evaluation $Initial PT Evaluation Tier I: 1 Procedure     PT G Codes:        Jasim Harari, Tessie Fass 07/30/2014, 6:09 PM  07/30/2014  Donnella Sham, [place] [phone]  (pager)

## 2014-07-31 DIAGNOSIS — N179 Acute kidney failure, unspecified: Secondary | ICD-10-CM

## 2014-07-31 DIAGNOSIS — N08 Glomerular disorders in diseases classified elsewhere: Secondary | ICD-10-CM

## 2014-07-31 DIAGNOSIS — N189 Chronic kidney disease, unspecified: Secondary | ICD-10-CM

## 2014-07-31 LAB — RPR: RPR Ser Ql: NONREACTIVE

## 2014-07-31 LAB — CBC WITH DIFFERENTIAL/PLATELET
Basophils Absolute: 0 10*3/uL (ref 0.0–0.1)
Basophils Relative: 0 % (ref 0–1)
Eosinophils Absolute: 0.2 10*3/uL (ref 0.0–0.7)
Eosinophils Relative: 3 % (ref 0–5)
HCT: 28.3 % — ABNORMAL LOW (ref 39.0–52.0)
Hemoglobin: 9.8 g/dL — ABNORMAL LOW (ref 13.0–17.0)
Lymphocytes Relative: 20 % (ref 12–46)
Lymphs Abs: 1.8 10*3/uL (ref 0.7–4.0)
MCH: 27.6 pg (ref 26.0–34.0)
MCHC: 34.6 g/dL (ref 30.0–36.0)
MCV: 79.7 fL (ref 78.0–100.0)
Monocytes Absolute: 0.6 10*3/uL (ref 0.1–1.0)
Monocytes Relative: 7 % (ref 3–12)
Neutro Abs: 6.2 10*3/uL (ref 1.7–7.7)
Neutrophils Relative %: 70 % (ref 43–77)
Platelets: 166 10*3/uL (ref 150–400)
RBC: 3.55 MIL/uL — ABNORMAL LOW (ref 4.22–5.81)
RDW: 13.8 % (ref 11.5–15.5)
WBC: 8.8 10*3/uL (ref 4.0–10.5)

## 2014-07-31 LAB — BASIC METABOLIC PANEL
Anion gap: 8 (ref 5–15)
BUN: 42 mg/dL — ABNORMAL HIGH (ref 6–20)
CO2: 15 mmol/L — ABNORMAL LOW (ref 22–32)
Calcium: 8.3 mg/dL — ABNORMAL LOW (ref 8.9–10.3)
Chloride: 111 mmol/L (ref 101–111)
Creatinine, Ser: 6.22 mg/dL — ABNORMAL HIGH (ref 0.61–1.24)
GFR calc Af Amer: 13 mL/min — ABNORMAL LOW (ref >60)
GFR calc non Af Amer: 11 mL/min — ABNORMAL LOW (ref >60)
Glucose, Bld: 105 mg/dL — ABNORMAL HIGH (ref 65–99)
Potassium: 4.7 mmol/L (ref 3.5–5.1)
Sodium: 134 mmol/L — ABNORMAL LOW (ref 135–145)

## 2014-07-31 LAB — T-HELPER CELLS (CD4) COUNT (NOT AT ARMC)
CD4 % Helper T Cell: 3 % — ABNORMAL LOW (ref 33–55)
CD4 T Cell Abs: 60 /uL — ABNORMAL LOW (ref 400–2700)

## 2014-07-31 LAB — LEGIONELLA ANTIGEN, URINE

## 2014-07-31 LAB — PATHOLOGIST SMEAR REVIEW

## 2014-07-31 MED ORDER — SODIUM BICARBONATE 650 MG PO TABS
650.0000 mg | ORAL_TABLET | Freq: Three times a day (TID) | ORAL | Status: DC
  Administered 2014-07-31 – 2014-08-02 (×7): 650 mg via ORAL
  Filled 2014-07-31 (×9): qty 1

## 2014-07-31 MED ORDER — CEFUROXIME AXETIL 500 MG PO TABS
500.0000 mg | ORAL_TABLET | Freq: Two times a day (BID) | ORAL | Status: DC
  Administered 2014-08-01 – 2014-08-02 (×3): 500 mg via ORAL
  Filled 2014-07-31 (×5): qty 1

## 2014-07-31 MED ORDER — MAGNESIUM SULFATE 2 GM/50ML IV SOLN
2.0000 g | Freq: Once | INTRAVENOUS | Status: AC
  Administered 2014-07-31: 2 g via INTRAVENOUS
  Filled 2014-07-31: qty 50

## 2014-07-31 MED ORDER — AZITHROMYCIN 250 MG PO TABS
250.0000 mg | ORAL_TABLET | Freq: Every day | ORAL | Status: AC
  Administered 2014-07-31 – 2014-08-02 (×3): 250 mg via ORAL
  Filled 2014-07-31 (×3): qty 1

## 2014-07-31 NOTE — Progress Notes (Signed)
Patient ID: Jim Wyatt, male   DOB: 10/26/1980, 34 y.o.   MRN: [id number]  [facility] Progress Note    Assessment/ Plan:   1. 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 Urine output not charted but the patient informs me that he has voided urine. No acute dialysis needs noted at this time. 2. Metabolic acidosis (anion gap) secondary to acute renal failure, adjust oral sodium bicarbonate dose 3. Anemia: Anemia of chronic disease/chronic kidney disease, check iron studies 4. HIV infection: Anti-retroviral therapy restarted, continue to monitor 5. 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    Objective:   BP 156/93 mmHg  Pulse 99  Temp(Src) 99 F (37.2 C) (Oral)  Resp 20  Ht 5\' 7"  (1.702 m)  Wt 88.95 kg (196 lb 1.6 oz)  BMI 30.71 kg/m2  SpO2 98%  Intake/Output Summary (Last 24 hours) at 07/31/14 0913 Last data filed at 07/30/14 1755  Gross per 24 hour  Intake    480 ml  Output      0 ml  Net    480 ml   Weight change: 5.035 kg (11 lb 1.6 oz)  Physical Exam: Gen: Comfortably resting in bed CVS: Pulse regular in rate and rhythm, S1 and S2 normal Resp: Clear to auscultation, no rales Abd: Soft, flat, nontender Ext: No lower extremity edema noted  Imaging: Dg Chest 2 View  07/30/2014   CLINICAL DATA:  Cough and bronchitis.  Back pain.  EXAM: CHEST  2 VIEW  COMPARISON:  12/04/2013  FINDINGS: Patchy airspace opacity in the right upper lobe concerning for pneumonia. Minimal ill-defined left lobe opacity, atelectasis  or pneumonia. The heart size and mediastinal contours are normal. There is no pleural effusion or pneumothorax. Pulmonary vasculature is normal. No acute osseous abnormality is seen.  IMPRESSION: Patchy right upper lobe pneumonia. Atelectasis versus pneumonia in the left lower lobe.   Electronically Signed   By: Jeb Levering M.D.   On: 07/30/2014 04:34   Dg Lumbar Spine 2-3 Views  07/30/2014   CLINICAL DATA:  Low back pain from yesterday, no known injury  EXAM: LUMBAR SPINE - 2-3 VIEW  COMPARISON:  None.  FINDINGS: Three views of lumbar spine submitted. No acute fracture or subluxation. Alignment and vertebral body heights are preserved. Mild disc space flattening at L5-S1 level.  IMPRESSION: No acute fracture or subluxation. Mild disc space flattening at L5-S1 level.   Electronically Signed   By: Lahoma Crocker M.D.   On: 07/30/2014 11:14   US Renal  07/30/2014   CLINICAL DATA:  Acute on chronic renal failure.  EXAM: RENAL / URINARY TRACT ULTRASOUND COMPLETE  COMPARISON:  11/15/2013  FINDINGS: Right Kidney:  Length: 11.7 cm slightly decreased in size from prior exam. Marked increased cortical echogenicity slightly worse. No mass or hydronephrosis visualized.  Left Kidney:  Length: 11.0 cm slightly decreased in size from prior exam. Marked increased cortical echogenicity slightly worse. No mass or hydronephrosis visualized.  Bladder:  Appears normal for degree of bladder distention.  IMPRESSION: Slight decrease in size of kidneys compared to the prior exam with slight  progression of marked increased cortical echogenicity bilaterally compatible with significant medical renal disease.   Electronically Signed   By: Marin Olp M.D.   On: 07/30/2014 11:09    Labs: BMET  Recent Labs Lab 07/30/14 0350 07/30/14 0850 07/31/14 0504  NA 130*  --  134*  K 5.0  --  4.7  CL 103  --  111  CO2 18*  --  15*  GLUCOSE 93  --  105*  BUN 44*  --  42*  CREATININE 6.70*  --  6.22*  CALCIUM 8.4*  --  8.3*  PHOS   --  3.6  --    CBC  Recent Labs Lab 07/30/14 0350 07/31/14 0504  WBC 11.5* 8.8  NEUTROABS 8.9* 6.2  HGB 10.9* 9.8*  HCT 31.9* 28.3*  MCV 79.4 79.7  PLT 139* 166    Medications:    . abacavir  600 mg Oral Daily  . amLODipine  5 mg Oral Daily  . azithromycin  500 mg Intravenous Q24H  . budesonide (PULMICORT) nebulizer solution  0.25 mg Nebulization BID  . cefTRIAXone (ROCEPHIN)  IV  1 g Intravenous Q24H  . dapsone  100 mg Oral Daily  . guaiFENesin  600 mg Oral BID  . heparin  5,000 Units Subcutaneous 3 times per day  . lamiVUDine  100 mg Oral Daily  . raltegravir  400 mg Oral BID  . sodium bicarbonate  650 mg Oral BID   Elmarie Shiley, MD 07/31/2014, 9:13 AM

## 2014-07-31 NOTE — Progress Notes (Signed)
[facility] for Infect[facility] Disease    Date of Admission:  07/30/2014   Total days of antibiotics 2        Day 2 abc        Day 2 rlg        Day 2 lamivudine        Day 2 azithro/ceftriaxone        Day 2 dapsone   ID: Jim Wyatt is a 34 y.o. male with advanced uncontrolled HIV disease, off of ART. 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 abacavir  600 mg Oral Daily  . amLODipine  5 mg Oral Daily  . azithromycin  500 mg Intravenous Q24H  . budesonide (PULMICORT) nebulizer solution  0.25 mg Nebulization BID  . cefTRIAXone (ROCEPHIN)  IV  1 g Intravenous Q24H  . dapsone  100 mg Oral Daily  . guaiFENesin  600 mg Oral BID  . heparin  5,000 Units Subcutaneous 3 times per day  . lamiVUDine  100 mg Oral Daily  . raltegravir  400 mg Oral BID  . sodium bicarbonate  650 mg Oral TID    Objective: Vital signs in last 24 hours: Temp:  [98.8 F (37.1 C)-99 F (37.2 C)] 99 F (37.2 C) (06/03 0442) Pulse Rate:  [90-99] 99 (06/03 0442) Resp:  [20] 20 (06/03 0442) BP: (156-165)/(93-96) 165/95 mmHg (06/03 1206) SpO2:  [98 %] 98 % (06/03 0727) Weight:  [196 lb 1.6 oz (88.95 kg)] 196 lb 1.6 oz (88.95 kg) (06/03 0442) Physical Exam  Constitutional: He is oriented to person, place, and time. He appears well-developed and well-nourished. No distress.  HENT:  Mouth/Throat: Oropharynx is clear and moist. No oropharyngeal exudate.  Cardiovascular: Normal rate, regular rhythm and normal heart sounds. Exam reveals no  gallop and no friction rub.  No murmur heard.  Pulmonary/Chest: Effort normal and breath sounds normal. No respiratory distress. He has no wheezes.  Abdominal: Soft. Bowel sounds are normal. He exhibits no distension. There is no tenderness.  Lymphadenopathy:  He has no cervical adenopathy.  Neurological: He is alert and oriented to person, place, and time.  Skin: Skin is warm and dry. No rash noted. No erythema.  Psychiatric: He has a normal mood and affect. His behavior is normal.     Lab Results  Recent Labs  07/30/14 0350 07/31/14 0504  WBC 11.5* 8.8  HGB 10.9* 9.8*  HCT 31.9* 28.3*  NA 130* 134*  K 5.0 4.7  CL 103 111  CO2 18* 15*  BUN 44* 42*  CREATININE 6.70* 6.22*   Liver Panel  Recent Labs  07/30/14 0350  PROT 9.1*  ALBUMIN 2.7*  AST 19  ALT 18  ALKPHOS 50  BILITOT 0.5   Sedimentation Rate No results for input(s): ESRSEDRATE in the last 72 hours. C-Reactive Protein No results for input(s): CRP in the last 72 hours.  Microbiology: Flu negative 6/2 blood cx negative Studies/Results: Dg Chest 2 View  07/30/2014   CLINICAL DATA:  Cough and bronchitis.  Back pain.  EXAM: CHEST  2 VIEW  COMPARISON:  12/04/2013  FINDINGS: Patchy airspace opacity in the right upper lobe concerning for pneumonia. Minimal ill-defined left lobe opacity, atelectasis or pneumonia. The heart size and mediastinal contours are normal. There is no pleural effusion or pneumothorax. Pulmonary vasculature is normal. No acute osseous abnormality is seen.  IMPRESSION: Patchy right upper lobe pneumonia. Atelectasis versus pneumonia in the left lower lobe.   Electronically Signed   By: Jeb Levering M.D.   On: 07/30/2014 04:34   Dg Lumbar Spine 2-3 Views  07/30/2014   CLINICAL DATA:  Low back pain from yesterday, no known injury  EXAM: LUMBAR SPINE - 2-3 VIEW  COMPARISON:  None.  FINDINGS: Three views of lumbar spine submitted. No acute fracture or subluxation. Alignment and vertebral body  heights are preserved. Mild disc space flattening at L5-S1 level.  IMPRESSION: No acute fracture or subluxation. Mild disc space flattening at L5-S1 level.   Electronically Signed   By: Lahoma Crocker M.D.   On: 07/30/2014 11:14   US Renal  07/30/2014   CLINICAL DATA:  Acute on chronic renal failure.  EXAM: RENAL / URINARY TRACT ULTRASOUND COMPLETE  COMPARISON:  11/15/2013  FINDINGS: Right Kidney:  Length: 11.7 cm slightly decreased in size from prior exam. Marked increased cortical echogenicity slightly worse. No mass or hydronephrosis visualized.  Left Kidney:  Length: 11.0 cm slightly decreased in size from prior exam. Marked increased cortical echogenicity slightly worse. No mass or hydronephrosis visualized.  Bladder:  Appears normal for degree of bladder distention.  IMPRESSION: Slight decrease in size of kidneys compared to the prior exam with slight progression of marked increased cortical echogenicity bilaterally compatible with significant medical renal disease.   Electronically Signed   By: Marin Olp M.D.   On: 07/30/2014 11:09     Assessment/Plan: hiv disease = restarted on RLG BID, abacavir 600mg  daily, lamivudine 100mg  liquid daily. He dislike liquid formulation. Informed him that he is unable to take pill at this time due to his kidney function. He will need new prescriptions for his HIV meds, since his prior rx have lapsed  oi proph = continue with dapsone 100mg  daily, and will start weekly azithromycin 1200mg  Q wk starting next week x 3 months  CAP = continue on azithromycin, can switch to orals, needs 3 more days. Can change ceftriaxone to cefuroxime 500mg  Q 12 to finish out 5 addn days for a total of 7 days of antimicrobials  FSGS/HIVAN = unclear how long his creatinine has been at 6. May improve if restarted and stays on hiv medications.  Return to clinic in 2-4 wks.  Baxter Flattery [facility] Cell: [phone] Pager: [phone]  07/31/2014,  1:51 PM

## 2014-07-31 NOTE — Progress Notes (Signed)
PROGRESS NOTE  JSEAN STOCKHAM [name] DOB: May 14, 1980 DOA: 07/30/2014 PCP: [name], MD  HPI: Mr. Macgill is a 34 y.o. male with a history of HIV/AIDS and bilateral DVT who presented 07/30/2014 to the ED with a worsening cough, and SOB with new onset fever and low back pain. He has not been compliant with HAART for 6 months due to cost.  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   Community acquired pneumonia   Metabolic acidosis   Anemia of chronic disease   Thrombocytopenia   Acute on chronic renal failure   HIVAN (HIV-associated nephropathy)   Community acquired pneumonia with sepsis - cough improving, fever curve improving - denies SOB - continue antibiotics, duoneb and budesonide - appreciate ID input, antibiotics narrowed to Ceftin  HIV - ID following - CD 4 decreased at 60, prophylaxis per ID, with Zithromax, Dapsone - review of CXR does not support PCP - HAART  Acute on chronic renal failure / FSGS with chronic glomerulonephritis - Nephrology following - amlodipine for BP control,  - renal vein doppler or MRV if no improvement - slight improvement in his renal function   Non anion gap metabolic acidosis - secondary to acute on chronic renal failure - adjusted oral sodium bicarbonate dose  Anemia of chronic disease - obtain iron studies   Diet: Diet Heart Room service appropriate?: Yes; Fluid consistency:: Thin Fluids: none DVT Prophylaxis: heparin  Code Status: Full Code Family Communication: none Disposition Plan: monitor, supportive care  Consultants:  Infectious  Disease  Nephrology  Procedures:  None   Antibiotics  Anti-infectives    Start     Dose/Rate Route Frequency Ordered Stop   08/01/14 0530  levofloxacin (LEVAQUIN) IVPB 500 mg  Status:  Discontinued     500 mg 100 mL/hr over 60 Minutes Intravenous Every 24 hours 07/30/14 0809 07/30/14 0833   08/01/14 0530  levofloxacin (LEVAQUIN) IVPB 500 mg  Status:  Discontinued     500 mg 100 mL/hr over 60 Minutes Intravenous Every 48 hours 07/30/14 0833 07/30/14 1457   07/31/14 1000  lamiVUDine (EPIVIR) 10 MG/ML solution 100 mg     100 mg Oral Daily 07/30/14 1509     07/31/14 0800  cefTRIAXone (ROCEPHIN) 1 g in dextrose 5 % 50 mL IVPB - Premix     1 g 100 mL/hr over 30 Minutes Intravenous Every 24 hours 07/30/14 1519     07/31/14 0800  azithromycin (ZITHROMAX) 500 mg in dextrose 5 % 250 mL IVPB     500 mg 250 mL/hr over 60 Minutes Intravenous Every 24 hours 07/30/14 1519     07/30/14 1600  raltegravir (ISENTRESS) tablet 400 mg     400 mg Oral 2 times daily 07/30/14 1452     07/30/14 1600  abacavir (ZIAGEN) tablet 600 mg     600 mg Oral Daily 07/30/14 1452     07/30/14 1600  dapsone tablet 100 mg     100 mg Oral Daily 07/30/14 1452     07/30/14 1515  lamiVUDine (EPIVIR) tablet 150 mg     15 0 mg Oral  Once 07/30/14 1509 07/30/14 1639   07/30/14 1000  doxycycline (  VIBRA-TABS) tablet 100 mg  Status:  Discontinued     100 mg Oral Every 12 hours 07/30/14 0809 07/30/14 0829   07/30/14 1000  aztreonam (AZACTAM) injection 500 mg  Status:  Discontinued     500 mg Intramuscular Every 24 hours 07/30/14 0903 07/30/14 0916   07/30/14 0930  aztreonam (AZACTAM) 500 mg in dextrose 5 % 50 mL IVPB  Status:  Discontinued     500 mg 100 mL/hr over 30 Minutes Intravenous 3 times per day 07/30/14 0916 07/30/14 1457   07/30/14 0500  levofloxacin (LEVAQUIN) IVPB 750 mg     750 mg 100 mL/hr over 90 Minutes Intravenous  Once 07/30/14 0459 07/30/14 0700       Studies  Dg Chest 2 View  07/30/2014   CLINICAL  DATA:  Cough and bronchitis.  Back pain.  EXAM: CHEST  2 VIEW  COMPARISON:  12/04/2013  FINDINGS: Patchy airspace opacity in the right upper lobe concerning for pneumonia. Minimal ill-defined left lobe opacity, atelectasis or pneumonia. The heart size and mediastinal contours are normal. There is no pleural effusion or pneumothorax. Pulmonary vasculature is normal. No acute osseous abnormality is seen.  IMPRESSION: Patchy right upper lobe pneumonia. Atelectasis versus pneumonia in the left lower lobe.   Electronically Signed   By: Jeb Levering M.D.   On: 07/30/2014 04:34   Dg Lumbar Spine 2-3 Views  07/30/2014   CLINICAL DATA:  Low back pain from yesterday, no known injury  EXAM: LUMBAR SPINE - 2-3 VIEW  COMPARISON:  None.  FINDINGS: Three views of lumbar spine submitted. No acute fracture or subluxation. Alignment and vertebral body heights are preserved. Mild disc space flattening at L5-S1 level.  IMPRESSION: No acute fracture or subluxation. Mild disc space flattening at L5-S1 level.   Electronically Signed   By: Lahoma Crocker M.D.   On: 07/30/2014 11:14   US Renal  07/30/2014   CLINICAL DATA:  Acute on chronic renal failure.  EXAM: RENAL / URINARY TRACT ULTRASOUND COMPLETE  COMPARISON:  11/15/2013  FINDINGS: Right Kidney:  Length: 11.7 cm slightly decreased in size from prior exam. Marked increased cortical echogenicity slightly worse. No mass or hydronephrosis visualized.  Left Kidney:  Length: 11.0 cm slightly decreased in size from prior exam. Marked increased cortical echogenicity slightly worse. No mass or hydronephrosis visualized.  Bladder:  Appears normal for degree of bladder distention.  IMPRESSION: Slight decrease in size of kidneys compared to the prior exam with slight progression of marked increased cortical echogenicity bilaterally compatible with significant medical renal disease.   Electronically Signed   By: Marin Olp M.D.   On: 07/30/2014 11:09    Objective  Filed Vitals:    07/30/14 2135 07/31/14 0442 07/31/14 0727 07/31/14 1206  BP: 162/96 156/93  165/95  Pulse: 90 99    Temp: 98.8 F (37.1 C) 99 F (37.2 C)    TempSrc: Oral Oral    Resp: 20 20    Height:      Weight:  88.95 kg (196 lb 1.6 oz)    SpO2: 98% 98% 98%     Intake/Output Summary (Last 24 hours) at 07/31/14 1510 Last data filed at 07/31/14 0900  Gross per 24 hour  Intake    480 ml  Output      0 ml  Net    480 ml   Filed Weights   07/30/14 0335 07/31/14 0442  Weight: 83.915 kg (185 lb) 88.95 kg (196 lb 1.6 oz)  Exam:  General:  NAD, resting in bed  HEENT: normocephalic, no scleral icterus  Cardiovascular: RRR without MRG, 2+ peripheral pulses, no edema  Respiratory: CTA biL, good breath sounds, no wheezing, no crackles, no rales  Abdomen: soft, non tender, no guarding  MSK/Extremities: no clubbing/cyanosis, no joint swelling  Skin: no rashes  Neuro: non focal, strength 5/5 in all 4   Data Reviewed: Basic Metabolic Panel:  Recent Labs Lab 07/30/14 0350 07/30/14 0850 07/31/14 0504  NA 130*  --  134*  K 5.0  --  4.7  CL 103  --  111  CO2 18*  --  15*  GLUCOSE 93  --  105*  BUN 44*  --  42*  CREATININE 6.70*  --  6.22*  CALCIUM 8.4*  --  8.3*  MG  --  1.6*  --   PHOS  --  3.6  --    Liver Function Tests:  Recent Labs Lab 07/30/14 0350  AST 19  ALT 18  ALKPHOS 50  BILITOT 0.5  PROT 9.1*  ALBUMIN 2.7*   No results for input(s): LIPASE, AMYLASE in the last 168 hours. No results for input(s): AMMONIA in the last 168 hours. CBC:  Recent Labs Lab 07/30/14 0350 07/31/14 0504  WBC 11.5* 8.8  NEUTROABS 8.9* 6.2  HGB 10.9* 9.8*  HCT 31.9* 28.3*  MCV 79.4 79.7  PLT 139* 166   Cardiac Enzymes:  Recent Labs Lab 07/30/14 0350  TROPONINI <0.03   BNP (last 3 results)  Recent Labs  07/30/14 0350  BNP 28.7    ProBNP (last 3 results)  Recent Labs  11/07/13 0855  PROBNP 33.5    CBG: No results for input(s): GLUCAP in the last 168  hours.  Recent Results (from the past 240 hour(s))  Culture, blood (routine x 2)     Status: None (Preliminary result)   Collection Time: 07/30/14  5:20 AM  Result Value Ref Range Status   Specimen Description BLOOD RIGHT ANTECUBITAL  Final   Special Requests BOTTLES DRAWN AEROBIC AND ANAEROBIC 5CC  Final   Culture   Final           BLOOD CULTURE RECEIVED NO GROWTH TO DATE CULTURE WILL BE HELD FOR 5 DAYS BEFORE ISSUING A FINAL NEGATIVE REPORT Performed at [facility]    Report Status PENDING  Incomplete  Culture, blood (routine x 2)     Status: None (Preliminary result)   Collection Time: 07/30/14  5:24 AM  Result Value Ref Range Status   Specimen Description BLOOD LEFT HAND  Final   Special Requests BOTTLES DRAWN AEROBIC AND ANAEROBIC 5CC  Final   Culture   Final           BLOOD CULTURE RECEIVED NO GROWTH TO DATE CULTURE WILL BE HELD FOR 5 DAYS BEFORE ISSUING A FINAL NEGATIVE REPORT Performed at [facility]    Report Status PENDING  Incomplete     Scheduled Meds: . abacavir  600 mg Oral Daily  . amLODipine  5 mg Oral Daily  . azithromycin  500 mg Intravenous Q24H  . budesonide (PULMICORT) nebulizer solution  0.25 mg Nebulization BID  . cefTRIAXone (ROCEPHIN)  IV  1 g Intravenous Q24H  . dapsone  100 mg Oral Daily  . guaiFENesin  600 mg Oral BID  . heparin  5,000 Units Subcutaneous 3 times per day  . lamiVUDine  100 mg Oral Daily  . raltegravir  400 mg Oral BID  . sodium  bicarbonate  650 mg Oral TID   Continuous Infusions:    Emily Filbert, PA-S  Time Spent: 25 minutes  Marzetta Board, MD [facility] Pager [phone]. If 7 PM - 7 AM, please contact night-coverage at [email or web address], password [facility] 07/31/2014, 3:10 PM  LOS: 1 day

## 2014-08-01 LAB — HEPATITIS B SURFACE ANTIBODY, QUANTITATIVE: Hepatitis B-Post: <3.1 m[IU]/mL — ABNORMAL LOW (ref >9.9)

## 2014-08-01 LAB — BASIC METABOLIC PANEL
Anion gap: 8 (ref 5–15)
BUN: 41 mg/dL — ABNORMAL HIGH (ref 6–20)
CO2: 19 mmol/L — ABNORMAL LOW (ref 22–32)
Calcium: 8.5 mg/dL — ABNORMAL LOW (ref 8.9–10.3)
Chloride: 108 mmol/L (ref 101–111)
Creatinine, Ser: 6.23 mg/dL — ABNORMAL HIGH (ref 0.61–1.24)
GFR calc Af Amer: 13 mL/min — ABNORMAL LOW (ref >60)
GFR calc non Af Amer: 11 mL/min — ABNORMAL LOW (ref >60)
Glucose, Bld: 82 mg/dL (ref 65–99)
Potassium: 4.7 mmol/L (ref 3.5–5.1)
Sodium: 135 mmol/L (ref 135–145)

## 2014-08-01 LAB — CBC
HCT: 28.1 % — ABNORMAL LOW (ref 39.0–52.0)
Hemoglobin: 9.6 g/dL — ABNORMAL LOW (ref 13.0–17.0)
MCH: 27 pg (ref 26.0–34.0)
MCHC: 34.2 g/dL (ref 30.0–36.0)
MCV: 79.2 fL (ref 78.0–100.0)
Platelets: 129 10*3/uL — ABNORMAL LOW (ref 150–400)
RBC: 3.55 MIL/uL — ABNORMAL LOW (ref 4.22–5.81)
RDW: 13.3 % (ref 11.5–15.5)
WBC: 5.6 10*3/uL (ref 4.0–10.5)

## 2014-08-01 NOTE — Progress Notes (Signed)
PROGRESS NOTE  Jim Wyatt [name] DOB: 12-02-1980 DOA: 07/30/2014 PCP: Jim Chessman, MD  HPI: [name] is a 34 y.o. male with a history of HIV/AIDS and bilateral DVT who presented 07/30/2014 to the ED with a worsening cough, and SOB with new onset fever and low back pain. He has not been compliant with HAART for 6 months due to cost.  Subjective / 24 H Interval events - no chest pain, shortness of breath, no abdominal pain, nausea or vomiting.    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   Community acquired pneumonia   Metabolic acidosis   Anemia of chronic disease   Thrombocytopenia   Acute on chronic renal failure   HIVAN (HIV-associated nephropathy)   Community acquired pneumonia with sepsis - cough improving, fever curve improving - denies SOB - continue antibiotics, duoneb and budesonide - appreciate ID input, antibiotics narrowed to Ceftin  HIV - ID following - CD 4 decreased at 60, prophylaxis per ID, with Zithromax, Dapsone - review of CXR does not support PCP - HAART  Acute on chronic renal failure / FSGS with chronic glomerulonephritis - Nephrology following - amlodipine for BP control,  - renal vein doppler or MRV if no improvement - slight improvement in his renal function however still elevated Cr - monitor BMP in am, if stable may be able to go home on Sunday   Non anion gap metabolic acidosis - secondary to acute on chronic renal failure - adjusted oral sodium bicarbonate dose  Anemia of chronic disease - obtain iron studies   Diet: Diet Heart Room service appropriate?: Yes; Fluid consistency:: Thin Fluids: none DVT Prophylaxis: heparin  Code Status: Full Code Family Communication: none Disposition Plan: monitor, supportive care  Consultants:  Infectious  Disease  Nephrology  Procedures:  None   Antibiotics  Anti-infectives    Start     Dose/Rate Route Frequency Ordered Stop   08/01/14 0730  cefUROXime (CEFTIN) tablet 500 mg     500 mg Oral 2 times daily with meals 07/31/14 1605     08/01/14 0530  levofloxacin (LEVAQUIN) IVPB 500 mg  Status:  Discontinued     500 mg 100 mL/hr over 60 Minutes Intravenous Every 24 hours 07/30/14 0809 07/30/14 0833   08/01/14 0530  levofloxacin (LEVAQUIN) IVPB 500 mg  Status:  Discontinued     500 mg 100 mL/hr over 60 Minutes Intravenous Every 48 hours 07/30/14 0833 07/30/14 1457   07/31/14 1630  azithromycin (ZITHROMAX) tablet 250 mg     250 mg Oral Daily 07/31/14 1605 08/03/14 0959   07/31/14 1000  lamiVUDine (EPIVIR) 10 MG/ML solution 100 mg     100 mg Oral Daily 07/30/14 1509     07/31/14 0800  cefTRIAXone (ROCEPHIN) 1 g in dextrose 5 % 50 mL IVPB - Premix  Status:  Discontinued     1 g 100 mL/hr over 30 Minutes Intravenous Every 24 hours 07/30/14 1519 07/31/14 1605   07/31/14 0800  azithromycin (ZITHROMAX) 500 mg in dextrose 5 % 250 mL IVPB  Status:  Discontinued     500 mg 250 mL/hr over 60 Minutes Intravenous Every 24 hours 07/30/14 1519 07/31/14 1605   07/30/14 1600  raltegravir (ISENTRESS) tablet 400 mg     400 mg Oral 2 times daily 07/30/14 1452     06 /02/16 1600  abacavir (ZIAGEN) tablet 600 mg  600 mg Oral Daily 07/30/14 1452     07/30/14 1600  dapsone tablet 100 mg     100 mg Oral Daily 07/30/14 1452     07/30/14 1515  lamiVUDine (EPIVIR) tablet 150 mg     150 mg Oral  Once 07/30/14 1509 07/30/14 1639   07/30/14 1000  doxycycline (VIBRA-TABS) tablet 100 mg  Status:  Discontinued     100 mg Oral Every 12 hours 07/30/14 0809 07/30/14 0829   07/30/14 1000  aztreonam (AZACTAM) injection 500 mg  Status:  Discontinued     500 mg Intramuscular Every 24 hours 07/30/14 0903 07/30/14 0916   07/30/14 0930  aztreonam (AZACTAM) 500 mg in dextrose 5 % 50 mL IVPB  Status:  Discontinued      500 mg 100 mL/hr over 30 Minutes Intravenous 3 times per day 07/30/14 0916 07/30/14 1457   07/30/14 0500  levofloxacin (LEVAQUIN) IVPB 750 mg     750 mg 100 mL/hr over 90 Minutes Intravenous  Once 07/30/14 0459 07/30/14 0700       Studies  No results found.  Objective  Filed Vitals:   07/31/14 1500 07/31/14 2031 08/01/14 0412 08/01/14 1009  BP: 145/90 131/98 160/97 151/90  Pulse: 99 99 90 97  Temp: 98.4 F (36.9 C) 98.5 F (36.9 C) 98.7 F (37.1 C)   TempSrc: Oral Oral Oral   Resp: 20 20 20    Height:      Weight:   87 kg (191 lb 12.8 oz)   SpO2: 99% 97% 94%     Intake/Output Summary (Last 24 hours) at 08/01/14 1144 Last data filed at 08/01/14 0413  Gross per 24 hour  Intake    240 ml  Output   1000 ml  Net   -760 ml   Filed Weights   07/30/14 0335 07/31/14 0442 08/01/14 0412  Weight: 83.915 kg (185 lb) 88.95 kg (196 lb 1.6 oz) 87 kg (191 lb 12.8 oz)    Exam:  General:  NAD, resting in bed  HEENT: normocephalic, no scleral icterus  Cardiovascular: RRR without MRG, 2+ peripheral pulses, no edema  Respiratory: CTA biL, good breath sounds, no wheezing, no crackles, no rales  Abdomen: soft, non tender, no guarding  MSK/Extremities: no clubbing/cyanosis, no joint swelling  Data Reviewed: Basic Metabolic Panel:  Recent Labs Lab 07/30/14 0350 07/30/14 0850 07/31/14 0504 08/01/14 0234  NA 130*  --  134* 135  K 5.0  --  4.7 4.7  CL 103  --  111 108  CO2 18*  --  15* 19*  GLUCOSE 93  --  105* 82  BUN 44*  --  42* 41*  CREATININE 6.70*  --  6.22* 6.23*  CALCIUM 8.4*  --  8.3* 8.5*  MG  --  1.6*  --   --   PHOS  --  3.6  --   --    Liver Function Tests:  Recent Labs Lab 07/30/14 0350  AST 19  ALT 18  ALKPHOS 50  BILITOT 0.5  PROT 9.1*  ALBUMIN 2.7*   No results for input(s): LIPASE, AMYLASE in the last 168 hours. No results for input(s): AMMONIA in the last 168 hours. CBC:  Recent Labs Lab 07/30/14 0350 07/31/14 0504 08/01/14 0234   WBC 11.5* 8.8 5.6  NEUTROABS 8.9* 6.2  --   HGB 10.9* 9.8* 9.6*  HCT 31.9* 28.3* 28.1*  MCV 79.4 79.7 79.2  PLT 139* 166 129*   Cardiac Enzymes:  Recent Labs Lab  07/30/14 0350  TROPONINI <0.03   BNP (last 3 results)  Recent Labs  07/30/14 0350  BNP 28.7    ProBNP (last 3 results)  Recent Labs  11/07/13 0855  PROBNP 33.5    CBG: No results for input(s): GLUCAP in the last 168 hours.  Recent Results (from the past 240 hour(s))  Culture, blood (routine x 2)     Status: None (Preliminary result)   Collection Time: 07/30/14  5:20 AM  Result Value Ref Range Status   Specimen Description BLOOD RIGHT ANTECUBITAL  Final   Special Requests BOTTLES DRAWN AEROBIC AND ANAEROBIC 5CC  Final   Culture   Final           BLOOD CULTURE RECEIVED NO GROWTH TO DATE CULTURE WILL BE HELD FOR 5 DAYS BEFORE ISSUING A FINAL NEGATIVE REPORT Performed at [facility]    Report Status PENDING  Incomplete  Culture, blood (routine x 2)     Status: None (Preliminary result)   Collection Time: 07/30/14  5:24 AM  Result Value Ref Range Status   Specimen Description BLOOD LEFT HAND  Final   Special Requests BOTTLES DRAWN AEROBIC AND ANAEROBIC 5CC  Final   Culture   Final           BLOOD CULTURE RECEIVED NO GROWTH TO DATE CULTURE WILL BE HELD FOR 5 DAYS BEFORE ISSUING A FINAL NEGATIVE REPORT Performed at [facility]    Report Status PENDING  Incomplete     Scheduled Meds: . abacavir  600 mg Oral Daily  . amLODipine  5 mg Oral Daily  . azithromycin  250 mg Oral Daily  . budesonide (PULMICORT) nebulizer solution  0.25 mg Nebulization BID  . cefUROXime  500 mg Oral BID WC  . dapsone  100 mg Oral Daily  . guaiFENesin  600 mg Oral BID  . heparin  5,000 Units Subcutaneous 3 times per day  . lamiVUDine  100 mg Oral Daily  . raltegravir  400 mg Oral BID  . sodium bicarbonate  650 mg Oral TID   Continuous Infusions:   Marzetta Board, MD [facility] Pager [phone].  If 7 PM - 7 AM, please contact night-coverage at [email or web address], password [facility] 08/01/2014, 11:44 AM  LOS: 2 days

## 2014-08-01 NOTE — Progress Notes (Signed)
Patient ID: VANNAK LOJEK, male   DOB: [birth year], 34 y.o.   MRN: [id number]  [facility] Progress Note   Assessment/ Plan:   1. 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 No change in renal function overnight raising concerns as to whether this is his new baseline from progressive FSGS/HIVAN with his noncompliance with anti-retroviral therapy. I have sensitized the patient regarding possible need for dialysis in the future if this trend continues. Continue to hold ARB. 2. Metabolic acidosis (anion gap) secondary to acute renal failure, appears to be improving on oral sodium bicarbonate 3. Anemia: Anemia of chronic disease/chronic kidney disease, very low iron saturations-if intravenous iron 4. HIV infection: Anti-retroviral therapy restarted, continue to monitor for reconstitution (patient has started filling out forms for patient assistance to help him get his anti-retroviral therapy) 5. Community acquired pneumonia with sepsis: On broad-spectrum antibiotic therapy and further management/serologies per admission service  Subjective:   Reports to be feeling well-denies any specific complaints    Objective:   BP 160/97 mmHg  Pulse 90  Temp(Src) 98.7 F (37.1 C) (Oral)  Resp 20  Ht 5\' 7"  (1.702 m)  Wt 87 kg (191 lb 12.8 oz)  BMI 30.03 kg/m2  SpO2 94%  Intake/Output Summary (Last 24 hours) at 08/01/14 0851 Last data filed at 08/01/14 0413  Gross per 24 hour  Intake    480 ml  Output   1000 ml  Net   -520 ml   Weight change: -1.95 kg (-4 lb 4.8 oz)  Physical Exam: Gen: Comfortably resting in bed CVS: Pulse regular in rate and rhythm, S1 and S2 normal Resp: Clear to auscultation, no rales Abd: Soft, flat, nontender Ext: No lower extremity edema  Imaging: Dg Lumbar  Spine 2-3 Views  07/30/2014   CLINICAL DATA:  Low back pain from yesterday, no known injury  EXAM: LUMBAR SPINE - 2-3 VIEW  COMPARISON:  None.  FINDINGS: Three views of lumbar spine submitted. No acute fracture or subluxation. Alignment and vertebral body heights are preserved. Mild disc space flattening at L5-S1 level.  IMPRESSION: No acute fracture or subluxation. Mild disc space flattening at L5-S1 level.   Electronically Signed   By: Lahoma Crocker M.D.   On: 07/30/2014 11:14   US Renal  07/30/2014   CLINICAL DATA:  Acute on chronic renal failure.  EXAM: RENAL / URINARY TRACT ULTRASOUND COMPLETE  COMPARISON:  11/15/2013  FINDINGS: Right Kidney:  Length: 11.7 cm slightly decreased in size from prior exam. Marked increased cortical echogenicity slightly worse. No mass or hydronephrosis visualized.  Left Kidney:  Length: 11.0 cm slightly decreased in size from prior exam. Marked increased cortical echogenicity slightly worse. No mass or hydronephrosis visualized.  Bladder:  Appears normal for degree of bladder distention.  IMPRESSION: Slight decrease in size of kidneys compared to the prior exam with slight progression of marked increased cortical echogenicity bilaterally compatible with significant medical renal disease.   Electronically Signed   By: Marin Olp M.D.   On: 07/30/2014 11:09    Labs: BMET  Recent Labs Lab 07/30/14 0350 07/30/14 0850 07/31/14 0504 08/01/14 0234  NA 130*  --  134* 135  K 5.0  --  4.7 4.7  CL 103  --  111 108  CO2 18*  --  15* 19*  GLUCOSE 93  --  105* 82  BUN 44*  --  42* 41*  CREATININE 6.70*  --  6.22* 6.23*  CALCIUM 8.4*  --  8.3* 8.5*  PHOS  --  3.6  --   --    CBC  Recent Labs Lab 07/30/14 0350 07/31/14 0504 08/01/14 0234  WBC 11.5* 8.8 5.6  NEUTROABS 8.9* 6.2  --   HGB 10.9* 9.8* 9.6*  HCT 31.9* 28.3* 28.1*  MCV 79.4 79.7 79.2  PLT 139* 166 129*    Medications:    . abacavir  600 mg Oral Daily  . amLODipine  5 mg Oral Daily  .  azithromycin  250 mg Oral Daily  . budesonide (PULMICORT) nebulizer solution  0.25 mg Nebulization BID  . cefUROXime  500 mg Oral BID WC  . dapsone  100 mg Oral Daily  . guaiFENesin  600 mg Oral BID  . heparin  5,000 Units Subcutaneous 3 times per day  . lamiVUDine  100 mg Oral Daily  . raltegravir  400 mg Oral BID  . sodium bicarbonate  650 mg Oral TID   Elmarie Shiley, MD 08/01/2014, 8:51 AM

## 2014-08-02 DIAGNOSIS — D638 Anemia in other chronic diseases classified elsewhere: Secondary | ICD-10-CM

## 2014-08-02 LAB — RENAL FUNCTION PANEL
Albumin: 2.3 g/dL — ABNORMAL LOW (ref 3.5–5.0)
Anion gap: 5 (ref 5–15)
BUN: 41 mg/dL — ABNORMAL HIGH (ref 6–20)
CO2: 21 mmol/L — ABNORMAL LOW (ref 22–32)
Calcium: 8.4 mg/dL — ABNORMAL LOW (ref 8.9–10.3)
Chloride: 109 mmol/L (ref 101–111)
Creatinine, Ser: 6.45 mg/dL — ABNORMAL HIGH (ref 0.61–1.24)
GFR calc Af Amer: 12 mL/min — ABNORMAL LOW (ref >60)
GFR calc non Af Amer: 10 mL/min — ABNORMAL LOW (ref >60)
Glucose, Bld: 91 mg/dL (ref 65–99)
Phosphorus: 3.5 mg/dL (ref 2.5–4.6)
Potassium: 4.5 mmol/L (ref 3.5–5.1)
Sodium: 135 mmol/L (ref 135–145)

## 2014-08-02 MED ORDER — CEFUROXIME AXETIL 500 MG PO TABS: 500.0000 mg | ORAL_TABLET | Freq: Two times a day (BID) | ORAL | Status: DC

## 2014-08-02 MED ORDER — AMLODIPINE BESYLATE 5 MG PO TABS: 5.0000 mg | ORAL_TABLET | Freq: Every day | ORAL | Status: DC

## 2014-08-02 MED ORDER — LAMIVUDINE 10 MG/ML PO SOLN: 100.0000 mg | Freq: Every day | ORAL | Status: DC

## 2014-08-02 MED ORDER — RALTEGRAVIR POTASSIUM 400 MG PO TABS: 400.0000 mg | ORAL_TABLET | Freq: Two times a day (BID) | ORAL | Status: DC

## 2014-08-02 MED ORDER — ABACAVIR SULFATE 300 MG PO TABS
600.0000 mg | ORAL_TABLET | Freq: Every day | ORAL | Status: DC
Start: 2014-08-02 — End: 2014-08-26

## 2014-08-02 MED ORDER — SODIUM BICARBONATE 650 MG PO TABS: 650.0000 mg | ORAL_TABLET | Freq: Three times a day (TID) | ORAL | Status: DC

## 2014-08-02 MED ORDER — AZITHROMYCIN 600 MG PO TABS: 1200.0000 mg | ORAL_TABLET | ORAL | Status: DC

## 2014-08-02 MED ORDER — ABACAVIR SULFATE 300 MG PO TABS: 600.0000 mg | ORAL_TABLET | Freq: Every day | ORAL | Status: DC

## 2014-08-02 MED ORDER — DAPSONE 100 MG PO TABS: 100.0000 mg | ORAL_TABLET | Freq: Every day | ORAL | Status: DC

## 2014-08-02 MED ORDER — ALBUTEROL SULFATE HFA 108 (90 BASE) MCG/ACT IN AERS: 2.0000 | INHALATION_SPRAY | Freq: Four times a day (QID) | RESPIRATORY_TRACT | Status: DC | PRN

## 2014-08-02 NOTE — Discharge Instructions (Addendum)
Follow with [name], MD in 5-7 days  Please get a complete blood count and chemistry panel checked by your Primary MD at your next visit, and again as instructed by your Primary MD. Please get your medications reviewed and adjusted by your Primary MD.  Please request your Primary MD to go over all Hospital Tests and Procedure/Radiological results at the follow up, please get all Hospital records sent to your Prim MD by signing hospital release before you go home.  If you had Pneumonia of Lung problems at the Hospital: Please get a 2 view Chest X ray done in 6-8 weeks after hospital discharge or sooner if instructed by your Primary MD.  If you have Congestive Heart Failure: Please call your Cardiologist or Primary MD anytime you have any of the following symptoms:  1) 3 pound weight gain in 24 hours or 5 pounds in 1 week  2) shortness of breath, with or without a dry hacking cough  3) swelling in the hands, feet or stomach  4) if you have to sleep on extra pillows at night in order to breathe  Follow cardiac low salt diet and 1.5 lit/day fluid restriction.  If you have diabetes Accuchecks 4 times/day, Once in AM empty stomach and then before each meal. Log in all results and show them to your primary doctor at your next visit. If any glucose reading is under 80 or above 300 call your primary MD immediately.  If you have Seizure/Convulsions/Epilepsy: Please do not drive, operate heavy machinery, participate in activities at heights or participate in high speed sports until you have seen by Primary MD or a Neurologist and advised to do so again.  If you had Gastrointestinal Bleeding: Please ask your Primary MD to check a complete blood count within one week of discharge or at your next visit. Your endoscopic/colonoscopic biopsies that are pending at the time of discharge, will also need to followed by your Primary MD.  Get Medicines reviewed and adjusted. Please take all your  medications with you for your next visit with your Primary MD  Please request your Primary MD to go over all hospital tests and procedure/radiological results at the follow up, please ask your Primary MD to get all Hospital records sent to his/her office.  If you experience worsening of your admission symptoms, develop shortness of breath, life threatening emergency, suicidal or homicidal thoughts you must seek medical attention immediately by calling 911 or calling your MD immediately  if symptoms less severe.  You must read complete instructions/literature along with all the possible adverse reactions/side effects for all the Medicines you take and that have been prescribed to you. Take any new Medicines after you have completely understood and accpet all the possible adverse reactions/side effects.   Do not drive or operate heavy machinery when taking Pain medications.   Do not take more than prescribed Pain, Sleep and Anxiety Medications  Special Instructions: If you have smoked or chewed Tobacco  in the last 2 yrs please stop smoking, stop any regular Alcohol  and or any Recreational drug use.  Wear Seat belts while driving.  Please note You were cared for by a hospitalist during your hospital stay. If you have any questions about your discharge medications or the care you received while you were in the hospital after you are discharged, you can call the unit and asked to speak with the hospitalist on call if the hospitalist that took care of you is not available. Once  you are discharged, your primary care physician will handle any further medical issues. Please note that NO REFILLS for any discharge medications will be authorized once you are discharged, as it is imperative that you return to your primary care physician (or establish a relationship with a primary care physician if you do not have one) for your aftercare needs so that they can reassess your need for medications and monitor your  lab values.  You can reach the hospitalist office at phone [phone] or fax [phone]   If you do not have a primary care physician, you can call [phone] for a physician referral.  Activity: As tolerated with Full fall precautions use walker/cane & assistance as needed  Diet: renal  Disposition Home

## 2014-08-02 NOTE — Progress Notes (Signed)
Pt given discharge instructions. IV and tele removed. Pt alert and oriented. Left with all belongings.   Will continue to monitor.   [name]

## 2014-08-02 NOTE — Discharge Summary (Signed)
Physician Discharge Summary  TAFT CONROW [id number] DOB: 21-Jun-1980 DOA: 07/30/2014  PCP: [name], MD  Admit date: 07/30/2014 Discharge date: 08/02/2014  Time spent: > 30 minutes  Recommendations for Outpatient Follow-up:  1. Follow up with Dr. Posey Pronto / [facility] in 1-2 weeks 2. Follow up with Dr. 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 Patient reports having cough and some intermittent SOB for the last week; he endorses symptoms continue worsening and for the last 2 days has been also having fever now. He endorses anorexia as well and decrease PO intake during this time. Patient denies CP, nausea, vomiting, HA's, hematuria, dysuria, melena, hematochezia, hemoptysis or any other complaints. In Ed he was found to have fever up to 102.3, elevated WBC's, infiltrates on CXR suggesting PNA, worsening renal function (Cr 6.7) and was tachycardic/tachypneic. Blood cx's taken and patient started on levaquin. [facility] called to admit patient for further evaluation and tx.  Hospital Course:  Community acquired pneumonia with sepsis - patient was admitted and  initially started on Azithromycin and Ceftriaxone. ID was consulted and have followed patient while hospitalized. His antibiotics were narrowed to Azthromycin for 3 days and Ceftin. His respiratory status has been stable and was discharged home in stable condition to complete a total 7 day course with Ceftin.  HIV - ID following, CD 4 decreased at 60, prophylaxis per ID with Zithromax weekly and Dapsone. Review of CXR does not support PCP. He has been non compliant with his HAART medication, all his medications were prescribed on discharge.  Acute on chronic renal failure / FSGS with chronic glomerulonephritis - Nephrology was consulted and have followed patient while hospitalized. His renal function is severely impaired however remained stable, and will need close outpatient follow up, Nephrology will arrange close follow up.  Non anion gap metabolic acidosis - secondary to acute on chronic renal failure, adjusted oral sodium bicarbonate dose History of DVT - provoked in the setting of prolonged hospitalization in September 2015, completed Coumadin and was taken off of anticoagulation January 2016.  Anemia of chronic disease   Procedures:  None    Consultations:  Nephrology   Discharge Exam: Filed Vitals:   08/01/14 1500 08/01/14 1943 08/02/14 0408 08/02/14 1038  BP: 155/96 161/96 144/91   Pulse: 95 103 90   Temp:  98.7 F (37.1 C) 98.1 F (36.7 C)   TempSrc:  Oral Oral   Resp: 18 18 18    Height:      Weight:   87 kg (191 lb 12.8 oz)   SpO2: 97% 97% 98% 98%   General: NAD Cardiovascular: RRR Respiratory: CTA biL  Discharge  Instructions    Medication List    STOP taking these medications        furosemide 40 MG tablet  Commonly known as:  LASIX     Rivaroxaban 15 & 20 MG Tbpk  Commonly known as:  XARELTO STARTER PACK      TAKE these medications        abacavir 300 MG tablet  Commonly known as:  ZIAGEN  Take 2 tablets (600 mg total) by mouth daily.      acetaminophen 500 MG tablet  Commonly known as:  TYLENOL  Take 1,000 mg by mouth every 6 (six) hours as needed for moderate pain, fever or headache.     albuterol 108 (90 BASE) MCG/ACT inhaler  Commonly known as:  PROVENTIL HFA;VENTOLIN HFA  Inhale 2 puffs into the lungs every 6 (six) hours as needed for wheezing or shortness of breath.     amLODipine 5 MG tablet  Commonly known as:  NORVASC  Take 1 tablet (5 mg total) by mouth daily.     azithromycin 600 MG tablet  Commonly known as:  ZITHROMAX  Take 2 tablets (1,200 mg total) by mouth once a week.     cefUROXime 500 MG tablet  Commonly known as:  CEFTIN  Take 1 tablet (500 mg total) by mouth 2 (two) times daily with a meal.     dapsone 100 MG tablet  Take 1 tablet (100 mg total) by mouth daily.     lamiVUDine 10 MG/ML solution  Commonly known as:  EPIVIR  Take 10 mLs (100 mg total) by mouth daily.     raltegravir 400 MG tablet  Commonly known as:  ISENTRESS  Take 1 tablet (400 mg total) by mouth 2 (two) times daily.     sodium bicarbonate 650 MG tablet  Take 1 tablet (650 mg total) by mouth 3 (three) times daily.           Follow-up Information    Follow up with PATEL,JAY K., MD. Schedule an appointment as soon as possible for a visit in 1 week.   Specialty:  Nephrology   Contact information:   [place] [address] [phone]       Follow up with Bobby Rumpf, MD. Schedule an appointment as soon as possible for a visit in 2 weeks.   Specialty:  Infectious Diseases   Contact information:   [address] [address] [phone]      The results of significant diagnostics from this hospitalization (including imaging, microbiology, ancillary and laboratory) are listed below for reference.    Significant Diagnostic Studies: Dg Chest 2 View  07/30/2014   CLINICAL DATA:  Cough and bronchitis.  Back pain.  EXAM: CHEST  2 VIEW  COMPARISON:  12/04/2013  FINDINGS: Patchy  airspace opacity in the right upper lobe concerning for pneumonia. Minimal ill-defined left lobe opacity, atelectasis or pneumonia. The heart size and mediastinal contours are normal. There is no pleural effusion or pneumothorax. Pulmonary vasculature is normal. No acute osseous abnormality is seen.  IMPRESSION: Patchy right upper lobe pneumonia. Atelectasis versus pneumonia in the left lower lobe.   Electronically Signed   By: Jeb Levering M.D.   On: 07/30/2014 04:34   Dg Lumbar Spine 2-3 Views  07/30/2014   CLINICAL DATA:  Low back pain from yesterday, no known injury  EXAM: LUMBAR SPINE - 2-3 VIEW  COMPARISON:  None.  FINDINGS: Three views of lumbar spine submitted. No  acute fracture or subluxation. Alignment and vertebral body heights are preserved. Mild disc space flattening at L5-S1 level.  IMPRESSION: No acute fracture or subluxation. Mild disc space flattening at L5-S1 level.   Electronically Signed   By: Lahoma Crocker M.D.   On: 07/30/2014 11:14   US Renal  07/30/2014   CLINICAL DATA:  Acute on chronic renal failure.  EXAM: RENAL / URINARY TRACT ULTRASOUND COMPLETE  COMPARISON:  11/15/2013  FINDINGS: Right Kidney:  Length: 11.7 cm slightly decreased in size from prior exam. Marked increased cortical echogenicity slightly worse. No mass or hydronephrosis visualized.  Left Kidney:  Length: 11.0 cm slightly decreased in size from prior exam. Marked increased cortical echogenicity slightly worse. No mass or hydronephrosis visualized.  Bladder:  Appears normal for degree of bladder distention.  IMPRESSION: Slight decrease in size of kidneys compared to the prior exam with slight progression of marked increased cortical echogenicity bilaterally compatible with significant medical renal disease.   Electronically Signed   By: Marin Olp M.D.   On: 07/30/2014 11:09    Microbiology: Recent Results (from the past 240 hour(s))  Culture, blood (routine x 2)     Status: None (Preliminary result)    Collection Time: 07/30/14  5:20 AM  Result Value Ref Range Status   Specimen Description BLOOD RIGHT ANTECUBITAL  Final   Special Requests BOTTLES DRAWN AEROBIC AND ANAEROBIC 5CC  Final   Culture   Final           BLOOD CULTURE RECEIVED NO GROWTH TO DATE CULTURE WILL BE HELD FOR 5 DAYS BEFORE ISSUING A FINAL NEGATIVE REPORT Performed at [facility]    Report Status PENDING  Incomplete  Culture, blood (routine x 2)     Status: None (Preliminary result)   Collection Time: 07/30/14  5:24 AM  Result Value Ref Range Status   Specimen Description BLOOD LEFT HAND  Final   Special Requests BOTTLES DRAWN AEROBIC AND ANAEROBIC 5CC  Final   Culture   Final           BLOOD CULTURE RECEIVED NO GROWTH TO DATE CULTURE WILL BE HELD FOR 5 DAYS BEFORE ISSUING A FINAL NEGATIVE REPORT Performed at [facility]    Report Status PENDING  Incomplete    Labs: Basic Metabolic Panel:  Recent Labs Lab 07/30/14 0350 07/30/14 0850 07/31/14 0504 08/01/14 0234 08/02/14 1030  NA 130*  --  134* 135 135  K 5.0  --  4.7 4.7 4.5  CL 103  --  111 108 109  CO2 18*  --  15* 19* 21*  GLUCOSE 93  --  105* 82 91  BUN 44*  --  42* 41* 41*  CREATININE 6.70*  --  6.22* 6.23* 6.45*  CALCIUM 8.4*  --  8.3* 8.5* 8.4*  MG  --  1.6*  --   --   --   PHOS  --  3.6  --   --  3.5   Liver Function Tests:  Recent Labs Lab 07/30/14 0350 08/02/14 1030  AST 19  --   ALT 18  --   ALKPHOS 50  --   BILITOT 0.5  --   PROT 9.1*  --   ALBUMIN 2.7* 2.3*   CBC:  Recent Labs Lab 07/30/14 0350 07/31/14 0504 08/01/14 0234  WBC 11.5* 8.8 5.6  NEUTROABS 8.9* 6.2  --   HGB 10.9* 9.8* 9.6*  HCT 31.9* 28.3* 28.1*  MCV 79.4 79.7 79.2  PLT 139*  166 129*   Cardiac Enzymes:  Recent Labs Lab 07/30/14 0350  TROPONINI <0.03   BNP: BNP (last 3 results)  Recent Labs  07/30/14 0350  BNP 28.7    ProBNP (last 3 results)  Recent Labs  11/07/13 0855  PROBNP 33.5   Signed:  GHERGHE,  COSTIN  [facility] 08/02/2014, 1:03 PM

## 2014-08-03 LAB — HIV-1 RNA QUANT-NO REFLEX-BLD
HIV 1 RNA Quant: <20 copies/mL
LOG10 HIV-1 RNA: UNDETERMINED log10copy/mL

## 2014-08-05 LAB — CULTURE, BLOOD (ROUTINE X 2)
Culture: NO GROWTH
Culture: NO GROWTH

## 2014-08-26 ENCOUNTER — Ambulatory Visit (INDEPENDENT_AMBULATORY_CARE_PROVIDER_SITE_OTHER): Payer: No Typology Code available for payment source | Admitting: Internal Medicine

## 2014-08-26 ENCOUNTER — Encounter: Payer: Self-pay | Admitting: Internal Medicine

## 2014-08-26 VITALS — BP 165/108 | HR 76 | Temp 98.3°F | Ht 67.0 in | Wt 193.0 lb

## 2014-08-26 DIAGNOSIS — N032 Chronic nephritic syndrome with diffuse membranous glomerulonephritis: Secondary | ICD-10-CM

## 2014-08-26 DIAGNOSIS — Z9114 Patient's other noncompliance with medication regimen: Secondary | ICD-10-CM | POA: Diagnosis not present

## 2014-08-26 DIAGNOSIS — B20 Human immunodeficiency virus [HIV] disease: Secondary | ICD-10-CM | POA: Diagnosis not present

## 2014-08-26 LAB — BASIC METABOLIC PANEL WITH GFR
BUN: 51 mg/dL — ABNORMAL HIGH (ref 6–23)
CO2: 20 mEq/L (ref 19–32)
Calcium: 8.7 mg/dL (ref 8.4–10.5)
Chloride: 104 mEq/L (ref 96–112)
Creat: 6.66 mg/dL — ABNORMAL HIGH (ref 0.50–1.35)
GFR, Est African American: 12 mL/min — ABNORMAL LOW
GFR, Est Non African American: 10 mL/min — ABNORMAL LOW
Glucose, Bld: 88 mg/dL (ref 70–99)
Potassium: 4.9 mEq/L (ref 3.5–5.3)
Sodium: 134 mEq/L — ABNORMAL LOW (ref 135–145)

## 2014-08-26 MED ORDER — DARUNAVIR-COBICISTAT 800-150 MG PO TABS: 1.0000 | ORAL_TABLET | Freq: Every day | ORAL | Status: DC

## 2014-08-26 MED ORDER — DOLUTEGRAVIR SODIUM 50 MG PO TABS: 50.0000 mg | ORAL_TABLET | Freq: Every day | ORAL | Status: DC

## 2014-08-26 MED ORDER — ABACAVIR SULFATE 300 MG PO TABS: 600.0000 mg | ORAL_TABLET | Freq: Every day | ORAL | Status: DC

## 2014-08-26 NOTE — Progress Notes (Signed)
Patient ID: Jim Wyatt, male   DOB: 01/30/1981, 34 y.o.   MRN: [id number]       Patient ID: Jim Wyatt, male   DOB: 1980/03/25, 34 y.o.   MRN: [id number]  HPI Has been ABC/RLG/3TC where he was well controlled, but unclear what happened to access to hiv meds after he was released for the hospital. he now only taking 400mg  RLG daily . He still feels fatigued, works full time as well as goes to school.  Outpatient Encounter Prescriptions as of 08/26/2014  Medication Sig  . acetaminophen (TYLENOL) 500 MG tablet Take 1,000 mg by mouth every 6 (six) hours as needed for moderate pain, fever or headache.  . albuterol (PROVENTIL HFA;VENTOLIN HFA) 108 (90 BASE) MCG/ACT inhaler Inhale 2 puffs into the lungs every 6 (six) hours as needed for wheezing or shortness of breath.  [name] azithromycin (ZITHROMAX) 600 MG tablet Take 2 tablets (1,200 mg total) by mouth once a week.  . dapsone 100 MG tablet Take 1 tablet (100 mg total) by mouth daily.  . sodium bicarbonate 650 MG tablet Take 1 tablet (650 mg total) by mouth 3 (three) times daily.  [name] abacavir (ZIAGEN) 300 MG tablet Take 2 tablets (600 mg total) by mouth daily. (Patient not taking: Reported on 08/26/2014)  . amLODipine (NORVASC) 5 MG tablet Take 1 tablet (5 mg total) by mouth daily.  . cefUROXime (CEFTIN) 500 MG tablet Take 1 tablet (500 mg total) by mouth 2 (two) times daily with a meal. (Patient not taking: Reported on 08/26/2014)  . lamiVUDine (EPIVIR) 10 MG/ML solution Take 10 mLs (100 mg total) by mouth daily. (Patient not taking: Reported on 08/26/2014)  . raltegravir (ISENTRESS) 400 MG tablet Take 1 tablet (400 mg total) by mouth 2 (two) times daily. (Patient not taking: Reported on 08/26/2014)   No facility-administered encounter medications on file as of 08/26/2014.     Patient Active Problem List   Diagnosis Date Noted  . Community acquired pneumonia 07/30/2014  . Sepsis 07/30/2014  . Metabolic acidosis 123456  . Anemia of chronic  disease 07/30/2014  . Thrombocytopenia 07/30/2014  . Acute on chronic renal failure   . HIVAN (HIV-associated nephropathy)   . Human immunodeficiency virus (HIV) disease 12/11/2013  . Hospital discharge follow-up 12/11/2013  . CMV (cytomegalovirus infection) 12/07/2013  . Protein-calorie malnutrition, severe 12/06/2013  . 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 DVT (deep venous thrombosis) 11/24/2013  . Asthma, chronic 11/24/2013  . Acquired immune deficiency syndrome 11/14/2013  . AKI (acute kidney injury) 11/11/2013  . Elevated liver enzymes 11/09/2013  . History of shingles 11/09/2013  . Obesity 11/09/2013  . Pneumonia   . Asthma      Health Maintenance Due  Topic Date Due  . TETANUS/TDAP  01/21/2002     Review of Systems  Physical Exam   BP 165/108 mmHg  Pulse 76  Temp(Src) 98.3 F (36.8 C) (Oral)  Ht 5\' 7"  (1.702 m)  Wt 193 lb (87.544 kg)  BMI 30.22 kg/m2  Lab Results  Component Value Date   CD4TCELL 3* 07/30/2014   Lab Results  Component Value Date   CD4TABS 60* 07/30/2014   CD4TABS 80* 12/04/2013   CD4TABS 40* 11/09/2013   Lab Results  Component Value Date   HIV1RNAQUANT <20 07/31/2014   No results found for: HEPBSAB No results found for: RPR  CBC Lab Results  Component Value Date   WBC 5.6 08/01/2014   RBC 3.55* 08/01/2014  HGB 9.6* 08/01/2014   HCT 28.1* 08/01/2014   PLT 129* 08/01/2014   MCV 79.2 08/01/2014   MCH 27.0 08/01/2014   MCHC 34.2 08/01/2014   RDW 13.3 08/01/2014   LYMPHSABS 1.8 07/31/2014   MONOABS 0.6 07/31/2014   EOSABS 0.2 07/31/2014   BASOSABS 0.0 07/31/2014   BMET Lab Results  Component Value Date   NA 135 08/02/2014   K 4.5 08/02/2014   CL 109 08/02/2014   CO2 21* 08/02/2014   GLUCOSE 91 08/02/2014   BUN 41* 08/02/2014   CREATININE 6.45* 08/02/2014   CALCIUM 8.4* 08/02/2014   GFRNONAA 10* 08/02/2014   GFRAA 12* 08/02/2014     Assessment and Plan Will  change his regimen empiric: DLG(gilead), prezcobix (jansen card), abacavir(viiv). In order to improve adherence, we have narrowed regimen to once aday dosing. Again reimphasized take all 3 and hold off if he doesn't receive all 3 meds from pharmacy. We will trouble shoot his insurance for him. i suspect he has element of denial to not prioritze his meds and getting access of meds  fsgs nephropathy = will check his bmp  Spent 25 min in face to face time, greater than 50% in adherence counseling

## 2014-08-28 ENCOUNTER — Telehealth: Payer: Self-pay | Admitting: *Deleted

## 2014-08-28 LAB — HIV-1 RNA ULTRAQUANT REFLEX TO GENTYP+
HIV 1 RNA Quant: 1419 copies/mL — ABNORMAL HIGH (ref <20)
HIV-1 RNA Quant, Log: 3.15 {Log} — ABNORMAL HIGH (ref <1.30)

## 2014-08-28 NOTE — Telephone Encounter (Signed)
Jim Skeens, RN working on prior authorization of patient's medication, in contact with his insurance to determine why the abacavir is not covered.  Prescobix was authorized, Tivicay needed no authorization. She is working on [employer] for patient assistance. [name], RN

## 2014-09-03 NOTE — Telephone Encounter (Addendum)
PA for Prezcobix approved 08/27/14 to 08/27/15. Requesting that Jim Wyatt try to find some type of coverage for Ziagen.

## 2014-09-03 NOTE — Telephone Encounter (Signed)
Worked with Pam this morning.  She contacted [employer], requested the override for ziagen.  Approved.  RN contacted pharmacy, his copay is $15 for ziagen.  Patient does have copay assistance cards.  [facility] will have his complete regimen ready for pick up in 1 hour.  RN relayed this information to the patient.   Gwenlyn Perking from [facility] Department called for follow up on this patient. The HD was just notified of his positive test result.  RN confirmed results, Gwenlyn Perking picked up paper copies. [name], RN

## 2014-09-04 LAB — HIV-1 INTEGRASE GENOTYPE

## 2014-09-07 LAB — HIV-1 GENOTYPR PLUS

## 2014-10-06 ENCOUNTER — Encounter: Payer: Self-pay | Admitting: Internal Medicine

## 2014-10-12 ENCOUNTER — Telehealth: Payer: Self-pay | Admitting: *Deleted

## 2014-10-12 ENCOUNTER — Other Ambulatory Visit: Payer: Self-pay | Admitting: *Deleted

## 2014-10-12 DIAGNOSIS — N189 Chronic kidney disease, unspecified: Secondary | ICD-10-CM

## 2014-10-12 MED ORDER — ONDANSETRON HCL 4 MG PO TABS: ORAL_TABLET | ORAL | Status: DC

## 2014-10-12 NOTE — Telephone Encounter (Signed)
Patient will come in 8/16 for labs as requested by Dr. Baxter Flattery (CMP with GFR ordered).  He would like to try the zofran for his nausea, as discussed in the emails between patient and Dr. Baxter Flattery (see below).  Zofran will be a new prescription. Please advise strength, directions, # pills, # refills.   Jim Gandy, RN ________________________  I do not have a RX for Zofran. Anything for the nausea would help. My next scheduled appointment is on the 6th of September. Is there a time I can come in before that?  -Jim Wyatt  ----- Message -----  From: Jim Basques, MD  Sent: 10/08/2014 4:20 PM EDT  To: Jim Wyatt  Subject: RE: Visit Follow-Up Question   Hi Jim Wyatt, have you been taking zofran before you take your HIV meds. We can send in an RX for it to help out with your symptoms. i was wondering if you can come in so that we can check your kidney function. Sometimes if kidney function worsens, it usually appears as worsening nausea.    ----- Message -----   From: Jim Wyatt   Sent: 10/06/2014 4:03 PM EDT    To: Jim Basques, MD  Subject: Visit Follow-Up Question   New medications causing extreme nausea. Please advise. Jim Wyatt

## 2014-10-12 NOTE — Telephone Encounter (Signed)
Zofran 4 mg 30 minutes before her meds as needed for nausea, #30 with 3 refills.

## 2014-10-13 ENCOUNTER — Other Ambulatory Visit: Payer: No Typology Code available for payment source

## 2014-10-13 DIAGNOSIS — N189 Chronic kidney disease, unspecified: Secondary | ICD-10-CM

## 2014-10-14 ENCOUNTER — Telehealth: Payer: Self-pay

## 2014-10-14 LAB — COMPLETE METABOLIC PANEL WITH GFR
ALT: 19 U/L (ref 9–46)
AST: 27 U/L (ref 10–40)
Albumin: 3.1 g/dL — ABNORMAL LOW (ref 3.6–5.1)
Alkaline Phosphatase: 38 U/L — ABNORMAL LOW (ref 40–115)
BUN: 48 mg/dL — ABNORMAL HIGH (ref 7–25)
CO2: 21 mmol/L (ref 20–31)
Calcium: 8.5 mg/dL — ABNORMAL LOW (ref 8.6–10.3)
Chloride: 105 mmol/L (ref 98–110)
Creat: 8.56 mg/dL — ABNORMAL HIGH (ref 0.60–1.35)
GFR, Est African American: 9 mL/min — ABNORMAL LOW (ref >=60)
GFR, Est Non African American: 7 mL/min — ABNORMAL LOW (ref >=60)
Glucose, Bld: 83 mg/dL (ref 65–99)
Potassium: 4.6 mmol/L (ref 3.5–5.3)
Sodium: 131 mmol/L — ABNORMAL LOW (ref 135–146)
Total Bilirubin: 0.3 mg/dL (ref 0.2–1.2)
Total Protein: 9.1 g/dL — ABNORMAL HIGH (ref 6.1–8.1)

## 2014-10-14 NOTE — Telephone Encounter (Signed)
Message left on machine for patient to call our office.  - 9:30 am  I will need additional information regarding his health history per DR Tommy Medal .  He suspects he may have renal failure and that is why creatinine is elevated.  Medical record shows history of acute kidney injury.

## 2014-10-14 NOTE — Telephone Encounter (Signed)
[facility] calling with critical value : Creatine: 8.56   1 month ago Creatine : 6.66  Please advise.   Laverle Patter, RN

## 2014-10-14 NOTE — Telephone Encounter (Signed)
As per our discussions, we need to see if he is now on HD if not needs to be seen reguarly by Nephrology

## 2014-10-15 NOTE — Telephone Encounter (Signed)
Called the patient per Dr Baxter Flattery to obtain further information about kidney health history was unable to reach him and left another message to call the office.

## 2014-11-03 ENCOUNTER — Ambulatory Visit: Payer: No Typology Code available for payment source | Admitting: Internal Medicine

## 2014-12-18 ENCOUNTER — Inpatient Hospital Stay (HOSPITAL_COMMUNITY): Payer: No Typology Code available for payment source | Admitting: Anesthesiology

## 2014-12-18 ENCOUNTER — Encounter (HOSPITAL_COMMUNITY)
Admission: EM | Disposition: A | Discharge status: Home / Self Care | Payer: Self-pay | Source: Home / Self Care | Attending: Internal Medicine

## 2014-12-18 ENCOUNTER — Emergency Department (HOSPITAL_COMMUNITY): Payer: No Typology Code available for payment source

## 2014-12-18 ENCOUNTER — Inpatient Hospital Stay (HOSPITAL_COMMUNITY)
Admission: EM | Admit: 2014-12-18 | Discharge: 2014-12-23 | DRG: 853 | Disposition: A | Discharge status: Home / Self Care | Payer: No Typology Code available for payment source | Attending: Internal Medicine | Admitting: Internal Medicine

## 2014-12-18 ENCOUNTER — Emergency Department (HOSPITAL_COMMUNITY)
Admission: EM | Admit: 2014-12-18 | Discharge: 2014-12-18 | Disposition: A | Discharge status: Home / Self Care | Payer: No Typology Code available for payment source | Source: Home / Self Care | Attending: Emergency Medicine | Admitting: Emergency Medicine

## 2014-12-18 ENCOUNTER — Encounter (HOSPITAL_COMMUNITY): Payer: Self-pay | Admitting: Emergency Medicine

## 2014-12-18 DIAGNOSIS — N032 Chronic nephritic syndrome with diffuse membranous glomerulonephritis: Secondary | ICD-10-CM | POA: Diagnosis present

## 2014-12-18 DIAGNOSIS — R6520 Severe sepsis without septic shock: Secondary | ICD-10-CM | POA: Diagnosis present

## 2014-12-18 DIAGNOSIS — M545 Low back pain, unspecified: Secondary | ICD-10-CM

## 2014-12-18 DIAGNOSIS — I150 Renovascular hypertension: Secondary | ICD-10-CM | POA: Diagnosis present

## 2014-12-18 DIAGNOSIS — N184 Chronic kidney disease, stage 4 (severe): Secondary | ICD-10-CM | POA: Diagnosis present

## 2014-12-18 DIAGNOSIS — M6009 Infective myositis, multiple sites: Secondary | ICD-10-CM | POA: Diagnosis not present

## 2014-12-18 DIAGNOSIS — A419 Sepsis, unspecified organism: Secondary | ICD-10-CM | POA: Diagnosis present

## 2014-12-18 DIAGNOSIS — N031 Chronic nephritic syndrome with focal and segmental glomerular lesions: Secondary | ICD-10-CM | POA: Diagnosis present

## 2014-12-18 DIAGNOSIS — M60009 Infective myositis, unspecified site: Secondary | ICD-10-CM

## 2014-12-18 DIAGNOSIS — M60051 Infective myositis, right thigh: Secondary | ICD-10-CM | POA: Diagnosis present

## 2014-12-18 DIAGNOSIS — N170 Acute kidney failure with tubular necrosis: Secondary | ICD-10-CM | POA: Diagnosis present

## 2014-12-18 DIAGNOSIS — Z880 Allergy status to penicillin: Secondary | ICD-10-CM | POA: Diagnosis not present

## 2014-12-18 DIAGNOSIS — N186 End stage renal disease: Secondary | ICD-10-CM | POA: Diagnosis present

## 2014-12-18 DIAGNOSIS — E871 Hypo-osmolality and hyponatremia: Secondary | ICD-10-CM | POA: Diagnosis present

## 2014-12-18 DIAGNOSIS — L02415 Cutaneous abscess of right lower limb: Secondary | ICD-10-CM | POA: Diagnosis present

## 2014-12-18 DIAGNOSIS — R509 Fever, unspecified: Secondary | ICD-10-CM | POA: Diagnosis not present

## 2014-12-18 DIAGNOSIS — E222 Syndrome of inappropriate secretion of antidiuretic hormone: Secondary | ICD-10-CM | POA: Diagnosis present

## 2014-12-18 DIAGNOSIS — B9689 Other specified bacterial agents as the cause of diseases classified elsewhere: Secondary | ICD-10-CM | POA: Diagnosis not present

## 2014-12-18 DIAGNOSIS — N269 Renal sclerosis, unspecified: Secondary | ICD-10-CM | POA: Diagnosis present

## 2014-12-18 DIAGNOSIS — Z881 Allergy status to other antibiotic agents status: Secondary | ICD-10-CM

## 2014-12-18 DIAGNOSIS — B999 Unspecified infectious disease: Secondary | ICD-10-CM

## 2014-12-18 DIAGNOSIS — E872 Acidosis: Secondary | ICD-10-CM | POA: Diagnosis present

## 2014-12-18 DIAGNOSIS — D696 Thrombocytopenia, unspecified: Secondary | ICD-10-CM | POA: Diagnosis present

## 2014-12-18 DIAGNOSIS — D631 Anemia in chronic kidney disease: Secondary | ICD-10-CM | POA: Diagnosis present

## 2014-12-18 DIAGNOSIS — R110 Nausea: Secondary | ICD-10-CM | POA: Diagnosis not present

## 2014-12-18 DIAGNOSIS — M609 Myositis, unspecified: Secondary | ICD-10-CM | POA: Diagnosis present

## 2014-12-18 DIAGNOSIS — K5900 Constipation, unspecified: Secondary | ICD-10-CM | POA: Insufficient documentation

## 2014-12-18 DIAGNOSIS — R609 Edema, unspecified: Secondary | ICD-10-CM

## 2014-12-18 DIAGNOSIS — A400 Sepsis due to streptococcus, group A: Secondary | ICD-10-CM | POA: Diagnosis present

## 2014-12-18 DIAGNOSIS — W010XXA Fall on same level from slipping, tripping and stumbling without subsequent striking against object, initial encounter: Secondary | ICD-10-CM | POA: Diagnosis present

## 2014-12-18 DIAGNOSIS — B950 Streptococcus, group A, as the cause of diseases classified elsewhere: Secondary | ICD-10-CM | POA: Diagnosis present

## 2014-12-18 DIAGNOSIS — W19XXXA Unspecified fall, initial encounter: Secondary | ICD-10-CM

## 2014-12-18 DIAGNOSIS — M726 Necrotizing fasciitis: Secondary | ICD-10-CM

## 2014-12-18 DIAGNOSIS — Z21 Asymptomatic human immunodeficiency virus [HIV] infection status: Secondary | ICD-10-CM | POA: Diagnosis not present

## 2014-12-18 DIAGNOSIS — B20 Human immunodeficiency virus [HIV] disease: Secondary | ICD-10-CM | POA: Diagnosis present

## 2014-12-18 DIAGNOSIS — M79651 Pain in right thigh: Secondary | ICD-10-CM

## 2014-12-18 DIAGNOSIS — D72829 Elevated white blood cell count, unspecified: Secondary | ICD-10-CM | POA: Diagnosis present

## 2014-12-18 DIAGNOSIS — B3781 Candidal esophagitis: Secondary | ICD-10-CM | POA: Diagnosis not present

## 2014-12-18 HISTORY — PX: I & D EXTREMITY: SHX5045

## 2014-12-18 LAB — SEDIMENTATION RATE: Sed Rate: 122 mm/h — ABNORMAL HIGH (ref 0–16)

## 2014-12-18 LAB — I-STAT CG4 LACTIC ACID, ED
Lactic Acid, Venous: 1.1 mmol/L (ref 0.5–2.0)
Lactic Acid, Venous: 1.04 mmol/L (ref 0.5–2.0)

## 2014-12-18 LAB — CBC WITH DIFFERENTIAL/PLATELET
Basophils Absolute: 0 10*3/uL (ref 0.0–0.1)
Basophils Relative: 0 %
Eosinophils Absolute: 0 10*3/uL (ref 0.0–0.7)
Eosinophils Relative: 0 %
HCT: 25 % — ABNORMAL LOW (ref 39.0–52.0)
Hemoglobin: 8.4 g/dL — ABNORMAL LOW (ref 13.0–17.0)
Lymphocytes Relative: 6 %
Lymphs Abs: 1.3 10*3/uL (ref 0.7–4.0)
MCH: 28.2 pg (ref 26.0–34.0)
MCHC: 33.6 g/dL (ref 30.0–36.0)
MCV: 83.9 fL (ref 78.0–100.0)
Monocytes Absolute: 0.9 10*3/uL (ref 0.1–1.0)
Monocytes Relative: 4 %
Neutro Abs: 19.1 10*3/uL — ABNORMAL HIGH (ref 1.7–7.7)
Neutrophils Relative %: 90 %
Platelets: 127 10*3/uL — ABNORMAL LOW (ref 150–400)
RBC: 2.98 MIL/uL — ABNORMAL LOW (ref 4.22–5.81)
RDW: 13.4 % (ref 11.5–15.5)
WBC: 21.3 10*3/uL — ABNORMAL HIGH (ref 4.0–10.5)

## 2014-12-18 LAB — COMPREHENSIVE METABOLIC PANEL
ALT: 14 U/L — ABNORMAL LOW (ref 17–63)
AST: 17 U/L (ref 15–41)
Albumin: 2.6 g/dL — ABNORMAL LOW (ref 3.5–5.0)
Alkaline Phosphatase: 32 U/L — ABNORMAL LOW (ref 38–126)
Anion gap: 12 (ref 5–15)
BUN: 61 mg/dL — ABNORMAL HIGH (ref 6–20)
CO2: 11 mmol/L — ABNORMAL LOW (ref 22–32)
Calcium: 8.6 mg/dL — ABNORMAL LOW (ref 8.9–10.3)
Chloride: 107 mmol/L (ref 101–111)
Creatinine, Ser: 9.48 mg/dL — ABNORMAL HIGH (ref 0.61–1.24)
GFR calc Af Amer: 8 mL/min — ABNORMAL LOW (ref >60)
GFR calc non Af Amer: 7 mL/min — ABNORMAL LOW (ref >60)
Glucose, Bld: 96 mg/dL (ref 65–99)
Potassium: 4.6 mmol/L (ref 3.5–5.1)
Sodium: 130 mmol/L — ABNORMAL LOW (ref 135–145)
Total Bilirubin: 0.8 mg/dL (ref 0.3–1.2)
Total Protein: 8.7 g/dL — ABNORMAL HIGH (ref 6.5–8.1)

## 2014-12-18 LAB — URINALYSIS, ROUTINE W REFLEX MICROSCOPIC
Bilirubin Urine: NEGATIVE
Glucose, UA: NEGATIVE mg/dL
Ketones, ur: NEGATIVE mg/dL
Leukocytes, UA: NEGATIVE
Nitrite: NEGATIVE
Protein, ur: >300 mg/dL — AB
Specific Gravity, Urine: 1.012 (ref 1.005–1.030)
Urobilinogen, UA: 0.2 mg/dL (ref 0.0–1.0)
pH: 6 (ref 5.0–8.0)

## 2014-12-18 LAB — URINE MICROSCOPIC-ADD ON

## 2014-12-18 LAB — CK: Total CK: 440 U/L — ABNORMAL HIGH (ref 49–397)

## 2014-12-18 LAB — I-STAT TROPONIN, ED: Troponin i, poc: 0.03 ng/mL (ref 0.00–0.08)

## 2014-12-18 SURGERY — IRRIGATION AND DEBRIDEMENT EXTREMITY
Anesthesia: General | Site: Thigh | Laterality: Right

## 2014-12-18 MED ORDER — FENTANYL CITRATE (PF) 250 MCG/5ML IJ SOLN
INTRAMUSCULAR | Status: AC
  Filled 2014-12-18: qty 5

## 2014-12-18 MED ORDER — MIDAZOLAM HCL 2 MG/2ML IJ SOLN
INTRAMUSCULAR | Status: DC | PRN
  Administered 2014-12-18: 2 mg via INTRAVENOUS

## 2014-12-18 MED ORDER — OXYCODONE-ACETAMINOPHEN 5-325 MG PO TABS
ORAL_TABLET | ORAL | Status: AC
  Filled 2014-12-18: qty 1

## 2014-12-18 MED ORDER — LIDOCAINE HCL (CARDIAC) 20 MG/ML IV SOLN
INTRAVENOUS | Status: AC
  Filled 2014-12-18: qty 5

## 2014-12-18 MED ORDER — FENTANYL CITRATE (PF) 250 MCG/5ML IJ SOLN
INTRAMUSCULAR | Status: DC | PRN
  Administered 2014-12-18: 150 ug via INTRAVENOUS
  Administered 2014-12-18 (×3): 50 ug via INTRAVENOUS

## 2014-12-18 MED ORDER — CLINDAMYCIN PHOSPHATE 900 MG/50ML IV SOLN
900.0000 mg | Freq: Once | INTRAVENOUS | Status: AC
  Administered 2014-12-18: 900 mg via INTRAVENOUS
  Filled 2014-12-18: qty 50

## 2014-12-18 MED ORDER — MORPHINE SULFATE (PF) 4 MG/ML IV SOLN
4.0000 mg | Freq: Once | INTRAVENOUS | Status: AC
  Administered 2014-12-18: 4 mg via INTRAVENOUS
  Filled 2014-12-18: qty 1

## 2014-12-18 MED ORDER — PROPOFOL 10 MG/ML IV BOLUS
INTRAVENOUS | Status: AC
  Filled 2014-12-18: qty 20

## 2014-12-18 MED ORDER — PROPOFOL 10 MG/ML IV BOLUS
INTRAVENOUS | Status: DC | PRN
Start: 2014-12-18 — End: 2014-12-19
  Administered 2014-12-18: 200 mg via INTRAVENOUS

## 2014-12-18 MED ORDER — SODIUM CHLORIDE 0.9 % IV BOLUS (SEPSIS)
1000.0000 mL | Freq: Once | INTRAVENOUS | Status: AC
  Administered 2014-12-18: 1000 mL via INTRAVENOUS

## 2014-12-18 MED ORDER — MIDAZOLAM HCL 2 MG/2ML IJ SOLN
INTRAMUSCULAR | Status: AC
  Filled 2014-12-18: qty 4

## 2014-12-18 MED ORDER — SODIUM CHLORIDE 0.9 % IV SOLN
INTRAVENOUS | Status: DC | PRN
Start: 2014-12-18 — End: 2014-12-19
  Administered 2014-12-18: 23:00:00 via INTRAVENOUS

## 2014-12-18 MED ORDER — ACETAMINOPHEN 500 MG PO TABS
1000.0000 mg | ORAL_TABLET | Freq: Once | ORAL | Status: AC
  Administered 2014-12-18: 1000 mg via ORAL
  Filled 2014-12-18: qty 2

## 2014-12-18 MED ORDER — SUCCINYLCHOLINE CHLORIDE 20 MG/ML IJ SOLN
INTRAMUSCULAR | Status: DC | PRN
  Administered 2014-12-18: 100 mg via INTRAVENOUS

## 2014-12-18 MED ORDER — ACETAMINOPHEN 325 MG PO TABS
650.0000 mg | ORAL_TABLET | Freq: Once | ORAL | Status: DC
  Filled 2014-12-18: qty 2

## 2014-12-18 MED ORDER — LIDOCAINE HCL (CARDIAC) 20 MG/ML IV SOLN
INTRAVENOUS | Status: DC | PRN
  Administered 2014-12-18: 60 mg via INTRAVENOUS

## 2014-12-18 MED ORDER — OXYCODONE-ACETAMINOPHEN 5-325 MG PO TABS
1.0000 | ORAL_TABLET | Freq: Once | ORAL | Status: DC
  Filled 2014-12-18: qty 1

## 2014-12-18 MED ORDER — SODIUM CHLORIDE 0.9 % IV SOLN
4.0000 mg/kg | Freq: Once | INTRAVENOUS | Status: AC
  Administered 2014-12-18: 345 mg via INTRAVENOUS
  Filled 2014-12-18: qty 6.9

## 2014-12-18 MED ORDER — OXYCODONE-ACETAMINOPHEN 5-325 MG PO TABS
1.0000 | ORAL_TABLET | Freq: Once | ORAL | Status: AC
  Administered 2014-12-18: 1 via ORAL

## 2014-12-18 MED ORDER — SODIUM CHLORIDE 0.9 % IV SOLN
250.0000 mg | Freq: Two times a day (BID) | INTRAVENOUS | Status: DC
  Administered 2014-12-18 – 2014-12-19 (×2): 250 mg via INTRAVENOUS
  Filled 2014-12-18 (×3): qty 250

## 2014-12-18 MED ORDER — KETOROLAC TROMETHAMINE 30 MG/ML IJ SOLN: 30.0000 mg | Freq: Once | INTRAMUSCULAR | Status: DC

## 2014-12-18 SURGICAL SUPPLY — 38 items
BINDER ABD UNIV 10 28-50 (GAUZE/BANDAGES/DRESSINGS) IMPLANT
BINDER ABDOM UNIV 10 (GAUZE/BANDAGES/DRESSINGS) ×2
BNDG GAUZE ELAST 4 BULKY (GAUZE/BANDAGES/DRESSINGS) ×1 IMPLANT
CANISTER SUCTION 2500CC (MISCELLANEOUS) ×2 IMPLANT
COVER SURGICAL LIGHT HANDLE (MISCELLANEOUS) ×2 IMPLANT
DRAIN PENROSE 1/2X12 LTX STRL (WOUND CARE) ×1 IMPLANT
DRAPE LAPAROSCOPIC ABDOMINAL (DRAPES) ×1 IMPLANT
DRAPE PED LAPAROTOMY (DRAPES) IMPLANT
DRAPE UTILITY XL STRL (DRAPES) ×4 IMPLANT
DRSG PAD ABDOMINAL 8X10 ST (GAUZE/BANDAGES/DRESSINGS) ×1 IMPLANT
ELECT CAUTERY BLADE 6.4 (BLADE) ×1 IMPLANT
ELECT REM PT RETURN 9FT ADLT (ELECTROSURGICAL) ×2
ELECTRODE REM PT RTRN 9FT ADLT (ELECTROSURGICAL) ×1 IMPLANT
GAUZE SPONGE 4X4 12PLY STRL (GAUZE/BANDAGES/DRESSINGS) ×1 IMPLANT
GLOVE BIO SURGEON STRL SZ7.5 (GLOVE) ×1 IMPLANT
GLOVE BIOGEL PI IND STRL 7.0 (GLOVE) IMPLANT
GLOVE BIOGEL PI IND STRL 8 (GLOVE) ×1 IMPLANT
GLOVE BIOGEL PI INDICATOR 7.0 (GLOVE) ×1
GLOVE BIOGEL PI INDICATOR 8 (GLOVE) ×2
GLOVE ECLIPSE 7.0 STRL STRAW (GLOVE) ×1 IMPLANT
GLOVE SURG ORTHO 8.0 STRL STRW (GLOVE) ×1 IMPLANT
GOWN STRL REUS W/ TWL LRG LVL3 (GOWN DISPOSABLE) ×2 IMPLANT
GOWN STRL REUS W/ TWL XL LVL3 (GOWN DISPOSABLE) ×1 IMPLANT
GOWN STRL REUS W/TWL LRG LVL3 (GOWN DISPOSABLE) ×4
GOWN STRL REUS W/TWL XL LVL3 (GOWN DISPOSABLE)
KIT BASIN OR (CUSTOM PROCEDURE TRAY) ×2 IMPLANT
KIT ROOM TURNOVER OR (KITS) ×2 IMPLANT
NS IRRIG 1000ML POUR BTL (IV SOLUTION) ×2 IMPLANT
PACK GENERAL/GYN (CUSTOM PROCEDURE TRAY) ×2 IMPLANT
PAD ARMBOARD 7.5X6 YLW CONV (MISCELLANEOUS) ×2 IMPLANT
SPONGE GAUZE 4X4 12PLY STER LF (GAUZE/BANDAGES/DRESSINGS) ×1 IMPLANT
SUT VIC AB 0 CT1 27 (SUTURE) ×2
SUT VIC AB 0 CT1 27XBRD ANBCTR (SUTURE) IMPLANT
SWAB COLLECTION DEVICE MRSA (MISCELLANEOUS) ×1 IMPLANT
TAPE CLOTH SURG 6X10 WHT LF (GAUZE/BANDAGES/DRESSINGS) ×1 IMPLANT
TOWEL OR 17X24 6PK STRL BLUE (TOWEL DISPOSABLE) ×1 IMPLANT
TOWEL OR 17X26 10 PK STRL BLUE (TOWEL DISPOSABLE) ×2 IMPLANT
TUBE ANAEROBIC SPECIMEN COL (MISCELLANEOUS) ×1 IMPLANT

## 2014-12-18 NOTE — ED Notes (Signed)
Pt. tripped and fell at work yesterday , no LOC / ambulatory , reports pain at right thigh and left lower back ,denies hematuria , no fever / respirations unlabored.

## 2014-12-18 NOTE — ED Notes (Signed)
Pt back to room from xray.

## 2014-12-18 NOTE — ED Notes (Signed)
PA at bedside.

## 2014-12-18 NOTE — Anesthesia Preprocedure Evaluation (Addendum)
Anesthesia Evaluation  Patient identified by MRN, date of birth, ID band Patient awake    Reviewed: Allergy & Precautions, NPO status , Patient's Chart, lab work & pertinent test results  Airway Mallampati: III  TM Distance: >3 FB Neck ROM: Full    Dental  (+) Teeth Intact, Dental Advisory Given   Pulmonary asthma ,    breath sounds clear to auscultation       Cardiovascular negative cardio ROS   Rhythm:Regular Rate:Normal     Neuro/Psych negative neurological ROS     GI/Hepatic negative GI ROS, Neg liver ROS,   Endo/Other  negative endocrine ROS  Renal/GU CRF and ARFRenal disease     Musculoskeletal   Abdominal   Peds  Hematology  (+) anemia ,   Anesthesia Other Findings   Reproductive/Obstetrics                            Anesthesia Physical Anesthesia Plan  ASA: III  Anesthesia Plan: General   Post-op Pain Management:    Induction: Intravenous  Airway Management Planned: Oral ETT  Additional Equipment:   Intra-op Plan:   Post-operative Plan: Extubation in OR  Informed Consent: I have reviewed the patients History and Physical, chart, labs and discussed the procedure including the risks, benefits and alternatives for the proposed anesthesia with the patient or authorized representative who has indicated his/her understanding and acceptance.   Dental advisory given  Plan Discussed with: CRNA  Anesthesia Plan Comments:         Anesthesia Quick Evaluation

## 2014-12-18 NOTE — ED Notes (Signed)
Pt from home for eval of right thigh pain, pt states sween yesterday for fall at work and was going to be admitted for "infection" but had to leave AMA due to school issues. Pt back today with increased fever and sob with palpitations. Pt alert and oriented, c/o weakness as well. Tenderness noted to pt right thigh.

## 2014-12-18 NOTE — ED Notes (Signed)
Patient transported to X-ray 

## 2014-12-18 NOTE — ED Provider Notes (Signed)
CSN: [id number]     Arrival date & time 12/18/14  1715 History   First MD Initiated Contact with Patient 12/18/14 1748     Chief Complaint  Patient presents with  . Fever  . Leg Pain     (Consider location/radiation/quality/duration/timing/severity/associated sxs/prior Treatment) HPI   Jim Wyatt is a 34 y.o M with a pmhx of HIV, DVT, CKD who presents to the emergency department today with fever and right thigh pain. Patient states that he was seen in the emergency department yesterday for the same issue however he left AMA for personal reasons. Patient states that 3 days ago he fell at work onto his right side. Since then he began having left lower back pain. Over the course of the last 2 days the pain has migrated down into his right thigh/quality between his groin and his knee. Pain is severe 10 out of 10. Patient is unable to lift his right leg due to pain. Patient is running a fever of 102. Patient states that he is having chills and feels like his heart is racing. Denies numbness, tingling, chest pain, shortness of breath, IV drug use. Denies bowel or bladder incontinence, saddle anesthesia.  Patient has a rod in his right femur that was placed 13 years ago.  Past Medical History  Diagnosis Date  . Shingles < 2010  . HIV infection ([place]) dx'd 10/2013  . Cellulitis 11/2013    LLE  . CAP (community acquired pneumonia) 10/2013  . DVT (deep venous thrombosis) ([place]) 10/2013    BLE  . Multiple food allergies   . Multiple environmental allergies     "trees, dogs, cats"  . Chronic kidney disease   . 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 Femur fracture surgery Right 09/2001  . Fracture surgery     No family history on file. Social History  Substance Use Topics  . Smoking status: Never Smoker   . Smokeless tobacco: Never Used  . Alcohol Use: Yes    Review of Systems  All other systems reviewed and are  negative.     Allergies  Bactrim; Cauliflower; Vancomycin; Augmentin; Banana; Fish allergy; and Onion  Home Medications   Prior to Admission medications   Medication Sig Start Date End Date Taking? Authorizing Provider  abacavir (ZIAGEN) 300 MG tablet Take 2 tablets (600 mg total) by mouth daily. 08/26/14  Yes Carlyle Basques, MD  acetaminophen (TYLENOL) 500 MG tablet Take 1,000 mg by mouth every 6 (six) hours as needed for moderate pain, fever or headache.   Yes Historical Provider, MD  albuterol (PROVENTIL HFA;VENTOLIN HFA) 108 (90 BASE) MCG/ACT inhaler Inhale 2 puffs into the lungs every 6 (six) hours as needed for wheezing or shortness of breath. 08/02/14  Yes Costin Karlyne Greenspan, MD  darunavir-cobicistat (PREZCOBIX) 800-150 MG per tablet Take 1 tablet by mouth daily. Swallow whole. Do NOT crush, break or chew tablets. Take with food. 08/26/14  Yes Carlyle Basques, MD  dolutegravir (TIVICAY) 50 MG tablet Take 1 tablet (50 mg total) by mouth daily. 08/26/14  Yes Carlyle Basques, MD  amLODipine (NORVASC) 5 MG tablet Take 1 tablet (5 mg total) by mouth daily. 08/02/14   Costin Karlyne Greenspan, MD  azithromycin (ZITHROMAX) 600 MG tablet Take 2 tablets (1,200 mg total) by mouth once a week. 08/02/14   Costin Karlyne Greenspan, MD  dapsone 100 MG tablet Take 1 tablet (100 mg total) by mouth daily. 08/02/14   [place],  MD  ondansetron (ZOFRAN) 4 MG tablet 30 minutes before her meds as needed for nausea 10/12/14   Michel Bickers, MD  sodium bicarbonate 650 MG tablet Take 1 tablet (650 mg total) by mouth 3 (three) times daily. 08/02/14   Costin Karlyne Greenspan, MD   BP 164/98 mmHg  Pulse 121  Temp(Src) 102.3 F (39.1 C) (Oral)  Resp 21  Ht 5\' 7"  (1.702 m)  Wt 190 lb (86.183 kg)  BMI 29.75 kg/m2  SpO2 100% Physical Exam  Constitutional: He is oriented to person, place, and time. He appears well-developed and well-nourished. No distress.  Ill-appearing male lying in bed. Patient febrile and tachycardic. Patient is  shivering.  HENT:  Head: Normocephalic and atraumatic.  Mouth/Throat: Oropharynx is clear and moist. No oropharyngeal exudate.  Eyes: Conjunctivae and EOM are normal. Pupils are equal, round, and reactive to light. Right eye exhibits no discharge. Left eye exhibits no discharge. No scleral icterus.  Neck: Normal range of motion. Neck supple.  Cardiovascular: Normal rate, regular rhythm, normal heart sounds and intact distal pulses.  Exam reveals no gallop and no friction rub.   No murmur heard. Pulmonary/Chest: Effort normal and breath sounds normal. No respiratory distress. He has no wheezes. He has no rales. He exhibits no tenderness.  Abdominal: Soft. Bowel sounds are normal. He exhibits no distension and no mass. There is no tenderness. There is no rebound and no guarding.  Musculoskeletal: Normal range of motion. He exhibits no edema.  Severe tenderness to palpation of right quadricep. Area appears swollen when compared to left quadricep. No erythema or signs of cellulitis.  Lymphadenopathy:    He has no cervical adenopathy.  Neurological: He is alert and oriented to person, place, and time. No cranial nerve deficit.  Patient unable to lift right leg due to severe pain.  Strength 5/5 throughout. No sensory deficits.    Skin: Skin is warm. No rash noted. He is diaphoretic. No erythema. No pallor.  Psychiatric: He has a normal mood and affect. His behavior is normal.  Nursing note and vitals reviewed.   ED Course  Procedures (including critical care time) Labs Review Labs Reviewed  COMPREHENSIVE METABOLIC PANEL - Abnormal; Notable for the following:    Sodium 130 (*)    CO2 11 (*)    BUN 61 (*)    Creatinine, Ser 9.48 (*)    Calcium 8.6 (*)    Total Protein 8.7 (*)    Albumin 2.6 (*)    ALT 14 (*)    Alkaline Phosphatase 32 (*)    GFR calc non Af Amer 7 (*)    GFR calc Af Amer 8 (*)    All other components within normal limits  CBC WITH DIFFERENTIAL/PLATELET - Abnormal;  Notable for the following:    WBC 21.3 (*)    RBC 2.98 (*)    Hemoglobin 8.4 (*)    HCT 25.0 (*)    Platelets 127 (*)    Neutro Abs 19.1 (*)    All other components within normal limits  URINALYSIS, ROUTINE W REFLEX MICROSCOPIC (NOT AT [facility]) - Abnormal; Notable for the following:    Color, Urine AMBER (*)    APPearance HAZY (*)    Hgb urine dipstick LARGE (*)    Protein, ur >300 (*)    All other components within normal limits  CK - Abnormal; Notable for the following:    Total CK 440 (*)    All other components within normal limits  CULTURE,  BLOOD (ROUTINE X 2)  CULTURE, BLOOD (ROUTINE X 2)  URINE CULTURE  URINE MICROSCOPIC-ADD ON  SEDIMENTATION RATE  I-STAT CG4 LACTIC ACID, ED  I-STAT TROPOININ, ED  I-STAT CG4 LACTIC ACID, ED    Imaging Review Dg Chest 2 View  12/18/2014  CLINICAL DATA:  Shortness breath, fever, tachycardia EXAM: CHEST  2 VIEW COMPARISON:  07/30/2014 FINDINGS: Lungs are clear.  No pleural effusion or pneumothorax. The heart is normal in size. Visualized osseous structures are within normal limits. IMPRESSION: Normal chest radiographs. Electronically Signed   By: Julian Hy M.D.   On: 12/18/2014 18:37   Dg Lumbar Spine Complete  12/18/2014  CLINICAL DATA:  Status post fall, with injury to the lower back. Initial encounter. EXAM: LUMBAR SPINE - COMPLETE 4+ VIEW COMPARISON:  Lumbar spine radiographs performed 07/30/2014 FINDINGS: There is no evidence of fracture or subluxation. Vertebral bodies demonstrate normal height and alignment. Intervertebral disc spaces are preserved. The visualized neural foramina are grossly unremarkable in appearance. The visualized bowel gas pattern is unremarkable in appearance; air and stool are noted within the colon. The sacroiliac joints are within normal limits. IMPRESSION: No evidence of fracture or subluxation along the lumbar spine. Electronically Signed   By: Garald Balding M.D.   On: 12/18/2014 03:51   Dg Femur, Min 2  Views Right  12/18/2014  CLINICAL DATA:  Status post fall, with injury to the right thigh. Initial encounter. EXAM: RIGHT FEMUR 2 VIEWS COMPARISON:  None. FINDINGS: There is no evidence of acute fracture or dislocation. The patient's right femoral intramedullary rod and screws appear intact, with associated mild chronic deformity. The right femoral head remains seated at the acetabulum. The knee joint is grossly unremarkable in appearance. No significant joint effusion is identified. A small focus of calcification overlying the anterior distal thigh may reflect a small loose body or vascular calcification. No significant soft tissue abnormalities are characterized on radiograph. IMPRESSION: No evidence of acute fracture or dislocation. Visualized hardware appears grossly intact. Electronically Signed   By: Garald Balding M.D.   On: 12/18/2014 03:52   I have personally reviewed and evaluated these images and lab results as part of my medical decision-making.   EKG Interpretation   Date/Time:  Friday December 18 2014 17:39:16 EDT Ventricular Rate:  128 PR Interval:  114 QRS Duration: 80 QT Interval:  292 QTC Calculation: 426 R Axis:   83 Text Interpretation:  Sinus tachycardia Septal infarct , age undetermined  ST \\T \ T wave abnormality, consider inferolateral ischemia Abnormal ECG  Since last tracing Rate faster and  Non-specific ST-t changes are new  Confirmed by Eulis Foster  MD, Vira Agar [id number]) on 12/18/2014 6:45:58 PM      MDM   Final diagnoses:  Infection    Patient with HIV/ADS presents to the emergency department febrile to 102, tachycardic complaining of right thigh pain. On physical exam no evidence of cellulitis. However concern for deep tissue infection, necrotizing fasciitis. Last CD4 count in 07/2014 was 60. Pt is severely immunocompromised.  No direct trauma to right thigh. Less likely to be compartment syndrome.  Will MRI right femur.  Lactic acid within normal limits. However  patient meets 3 SIRS criteria. We'll give IV antibiotics. Patient has severe allergy to vancomycin and Augmentin. We will consult ID for antibiotic recommendations.   Spoke with Dr. Drucilla Schmidt who recommends daptomycin, imipenem, clindamycin.  MRI technician states that they will not take the patient because his fever is 102. They state that they will not  take the patient has less fever is less than 100 because the MRI can raise the temperature over 104.  Patient has received 3 g of Tylenol over the course of today. Fever is still slowly increasing now 103.  Spoke with Dr. Rosendo Gros with general surgery and discussed the issue with concern in delay of care due to not being able to get the MRI. With the understanding that neck retracting fasciitis is not been officially diagnosed due to lack of imaging the concern is that he has an active infection that needs to be quickly debrided. Dr. Rosendo Gros states that orthopedics needs to be consulted for this issue due to the location of the possible source of infection as well as the fact that this patient has a prosthetic rod in his right femur. Recommend CT scan.  CT scan order for patient. Unfortunately contrast cannot be ordered for this patient due to creatinine being above 9.  ER physician Dr. Eulis Foster spoke with on-call orthopedic surgeon Dr. Rolena Infante. See his note for the context of their discussion.  Pt signed out to Dr. Eulis Foster at shift change. Pending CT imaging. This pt will need to be admitted.       Dondra Spry [place], PA-C 12/18/14 2116  Daleen Bo, MD 12/21/14 [phone]

## 2014-12-18 NOTE — ED Provider Notes (Signed)
  Face-to-face evaluation   History: He presents for evaluation of right leg pain, which was initially, after a fall yesterday. He was evaluated overnight in the emergency department, found to have a fever, but left before completing evaluation.  Physical exam: Alert, calm, cooperative, uncomfortable. Right thigh is very tender and swollen relative to the left. Neurovascularly intact distally in both feet. There is no lesion of either foot.   21:05- case was discussed with on-call orthopedics, Dr. Rolena Infante. I explained to him that we were concerned that the patient may have deep tissue infection, such as necrotizing fasciitis. He feels that it is very unlikely that this leg swelling and pain is related to his prior orthopedic procedure and appliance placement. He states that he will be happy to evaluate and treat the patient. If he has proven to have osteomyelitis. He recommends that the patient be treated with I&D, if he has an abscess, but that would not be his area of care and expertise. The patient is in CT at this time for imaging, because and MRI could not be done.   21:55- . I discussed the case findings with radiologist, Dr. Radene Knee. He does not believe this case represents necrotizing fasciitis at this time. He does see evidence for abscess, pyomyositis and myositis. Call placed to Gen. surgery for assistance in assessment and treatment   22:05-  Discussed with Dr. Rosendo Gros. He will review the CT images, and consider an operative treatment.  22:50- Dr. Rosendo Gros prefers that the patient be admitted by a hospitalist, internist.  10:55 PM-Consult complete with Dr. Arnoldo Morale. Patient case explained and discussed. She agrees to admit patient for further evaluation and treatment. Call ended at 23:06   CRITICAL CARE Performed by: Richarda Blade Total critical care time: 45 minutes Critical care time was exclusive of separately billable procedures and treating other patients. Critical care was  necessary to treat or prevent imminent or life-threatening deterioration. Critical care was time spent personally by me on the following activities: development of treatment plan with patient and/or surrogate as well as nursing, discussions with consultants, evaluation of patient's response to treatment, examination of patient, obtaining history from patient or surrogate, ordering and performing treatments and interventions, ordering and review of laboratory studies, ordering and review of radiographic studies, pulse oximetry and re-evaluation of patient's condition.   Medical screening examination/treatment/procedure(s) were conducted as a shared visit with non-physician practitioner(s) and myself.  I personally evaluated the patient during the encounter  Daleen Bo, MD 12/21/14 [phone]

## 2014-12-18 NOTE — ED Provider Notes (Signed)
CSN: [id number]     Arrival date & time 12/18/14  0001 History   By signing my name below, I, Jim Wyatt, attest that this documentation has been prepared under the direction and in the presence of Sharlett Iles, MD . Electronically Signed: Evelene Wyatt, Scribe. 12/18/2014. 3:04 AM.   Chief Complaint  Patient presents with  . Fall    The history is provided by the patient. No language interpreter was used.     HPI Comments:  Jim Wyatt is a 34 y.o. male who presents to the Emergency Department s/p fall yesterday complaining of moderate lower back pain (L>R) and right thigh pain following the incident. He notes he tripped and fell onto right side but caught his weight with outstretched hands. His pain is worse with movement. He denies head injury, LOC, neck pain, abdominal pain and CP. No alleviating factors noted.   Past Medical History  Diagnosis Date  . Shingles < 2010  . HIV infection ([place]) dx'd 10/2013  . Cellulitis 11/2013    LLE  . CAP (community acquired pneumonia) 10/2013  . DVT (deep venous thrombosis) ([place]) 10/2013    BLE  . Multiple food allergies   . Multiple environmental allergies     "trees, dogs, cats"  . Chronic kidney disease   . 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 Femur fracture surgery Right 09/2001  . Fracture surgery     No family history on file. Social History  Substance Use Topics  . Smoking status: Never Smoker   . Smokeless tobacco: Never Used  . Alcohol Use: Yes    Review of Systems  10 systems reviewed and all are negative for acute change except as noted in the HPI.  Allergies  Augmentin; Bactrim; Banana; Cauliflower; Fish allergy; Vancomycin; and Onion  Home Medications   Prior to Admission medications   Medication Sig Start Date End Date Taking? Authorizing Provider  abacavir (ZIAGEN) 300 MG tablet Take 2 tablets (600 mg total) by mouth daily. 08/26/14    Carlyle Basques, MD  acetaminophen (TYLENOL) 500 MG tablet Take 1,000 mg by mouth every 6 (six) hours as needed for moderate pain, fever or headache.    Historical Provider, MD  albuterol (PROVENTIL HFA;VENTOLIN HFA) 108 (90 BASE) MCG/ACT inhaler Inhale 2 puffs into the lungs every 6 (six) hours as needed for wheezing or shortness of breath. 08/02/14   Costin Karlyne Greenspan, MD  amLODipine (NORVASC) 5 MG tablet Take 1 tablet (5 mg total) by mouth daily. 08/02/14   Costin Karlyne Greenspan, MD  azithromycin (ZITHROMAX) 600 MG tablet Take 2 tablets (1,200 mg total) by mouth once a week. 08/02/14   Costin Karlyne Greenspan, MD  dapsone 100 MG tablet Take 1 tablet (100 mg total) by mouth daily. 08/02/14   Costin Karlyne Greenspan, MD  darunavir-cobicistat (PREZCOBIX) 800-150 MG per tablet Take 1 tablet by mouth daily. Swallow whole. Do NOT crush, break or chew tablets. Take with food. 08/26/14   Carlyle Basques, MD  dolutegravir (TIVICAY) 50 MG tablet Take 1 tablet (50 mg total) by mouth daily. 08/26/14   Carlyle Basques, MD  ondansetron (ZOFRAN) 4 MG tablet 30 minutes before her meds as needed for nausea 10/12/14   Michel Bickers, MD  sodium bicarbonate 650 MG tablet Take 1 tablet (650 mg total) by mouth 3 (three) times daily. 08/02/14   Costin Karlyne Greenspan, MD   BP 175/97 mmHg  Pulse 94  Temp(Src) 98.7 F (  37.1 C) (Oral)  Resp 22  SpO2 100% Physical Exam  Constitutional: He is oriented to person, place, and time. He appears well-developed and well-nourished. No distress.  HENT:  Head: Normocephalic and atraumatic.  Moist mucous membranes  Eyes: Conjunctivae are normal. Pupils are equal, round, and reactive to light.  Neck: Neck supple.  Cardiovascular: Normal rate, regular rhythm, normal heart sounds and intact distal pulses.   No murmur heard. Pulmonary/Chest: Effort normal and breath sounds normal.  Abdominal: Soft. Bowel sounds are normal. He exhibits no distension. There is no tenderness.  Musculoskeletal: He exhibits no edema.  TTP  of right thigh with no swellling ecchymosis or obvious deformity TTP of right hip with no hip asymmetry   Neurological: He is alert and oriented to person, place, and time.  Fluent speech  Skin: Skin is warm and dry.  Psychiatric: He has a normal mood and affect. Judgment normal.  Nursing note and vitals reviewed.   ED Course  Procedures   DIAGNOSTIC STUDIES:  Oxygen Saturation is 100% on RA, normal by my interpretation.    COORDINATION OF CARE:  3:01 AM Will order XR of back and RLE, as well as pain meds. Discussed treatment plan with pt at bedside and pt agreed to plan.  Labs Review Labs Reviewed - No data to display  Imaging Review No results found. Imaging Results       DG Lumbar Spine Complete (Final result) Result time: 12/18/14 03:51:21   Final result by Rad Results In Interface (12/18/14 03:51:21)   Narrative:   CLINICAL DATA: Status post fall, with injury to the lower back. Initial encounter.  EXAM: LUMBAR SPINE - COMPLETE 4+ VIEW  COMPARISON: Lumbar spine radiographs performed 07/30/2014  FINDINGS: There is no evidence of fracture or subluxation. Vertebral bodies demonstrate normal height and alignment. Intervertebral disc spaces are preserved. The visualized neural foramina are grossly unremarkable in appearance.  The visualized bowel gas pattern is unremarkable in appearance; air and stool are noted within the colon. The sacroiliac joints are within normal limits.  IMPRESSION: No evidence of fracture or subluxation along the lumbar spine.   Electronically Signed By: Garald Balding M.D. On: 12/18/2014 03:51          DG FEMUR, MIN 2 VIEWS RIGHT (Final result) Result time: 12/18/14 03:52:46   Procedure changed from DG Femur 1V Right      Final result by Rad Results In Interface (12/18/14 03:52:46)   Narrative:   CLINICAL DATA: Status post fall, with injury to the right thigh. Initial encounter.  EXAM: RIGHT FEMUR 2  VIEWS  COMPARISON: None.  FINDINGS: There is no evidence of acute fracture or dislocation. The patient's right femoral intramedullary rod and screws appear intact, with associated mild chronic deformity. The right femoral head remains seated at the acetabulum. The knee joint is grossly unremarkable in appearance. No significant joint effusion is identified. A small focus of calcification overlying the anterior distal thigh may reflect a small loose body or vascular calcification.  No significant soft tissue abnormalities are characterized on radiograph.  IMPRESSION: No evidence of acute fracture or dislocation. Visualized hardware appears grossly intact.       EKG Interpretation None      MDM   Final diagnoses:  Left-sided low back pain without sciatica  Right thigh pain  Fever, unspecified fever cause   34yo M who p/w R thigh and low back pain after a fall yesterday. Pt uncomfortable but in NAD at presentation. VS notable for hypertension  at 175/111. He was neurovascularly intact b/l LE, tenderness to palpation of R thigh without obvious deformity or skin changes. Obtained plain films of back and R leg which were negative for acute injury. During ED course, nurse informed me that patient had spiked fever to 102. I reviewed his chart and noted that in June his CD4 count was very low, <100. Informed patient of my concern for infection given immunocompromise and need for admission to hospital with full infectious workup. Pt stated that he had to get to class and didn't want to stay. I reviewed risks of leaving including worsening condition, disability or even death. Pt voiced understanding but continued to request to leave. I emphasized importance of coming straight back after class. Pt left AMA.  I personally performed the services described in this documentation, which was scribed in my presence. The recorded information has been reviewed and is accurate.   Sharlett Iles, MD 12/19/14 2110

## 2014-12-18 NOTE — Anesthesia Procedure Notes (Signed)
Date/Time: 12/18/2014 11:29 PM Performed by: Valetta Fuller Pre-anesthesia Checklist: Patient identified, Emergency Drugs available, Suction available and Patient being monitored Patient Re-evaluated:Patient Re-evaluated prior to inductionOxygen Delivery Method: Circle system utilized Preoxygenation: Pre-oxygenation with 100% oxygen Intubation Type: IV induction and Rapid sequence Laryngoscope Size: Miller and 2 Grade View: Grade I Tube type: Oral Tube size: 7.5 mm Number of attempts: 1 Airway Equipment and Method: Stylet Placement Confirmation: ETT inserted through vocal cords under direct vision,  positive ETCO2 and breath sounds checked- equal and bilateral Secured at: 24 cm Tube secured with: Tape Dental Injury: Teeth and Oropharynx as per pre-operative assessment

## 2014-12-18 NOTE — ED Notes (Signed)
Notified Dr. Rex Kras of temp.  Removed extra blankets from pts.

## 2014-12-18 NOTE — Consult Note (Signed)
Reason for Consult: Right lower extremity pain Referring Physician: Dr. Florence Canner is an 34 y.o. male.  HPI: The patient is a 34 year old male with a past medical history significant for HIV/AIDS, DVT, chronic kidney disease.  Patient states that he has a two-day history of right lower extremity pain. Patient states that he's continue with fevers during this time. He states he has decreased range of motion secondary to pain. He states there has been no trauma to his right lower extremity.  Upon evaluation ER the patient on a CT scan which revealed a focal abscess of the right quadriceps muscle. Secondary to this surgery was consulted for management of his deep right thigh compartment abscess.  Past Medical History  Diagnosis Date  . Shingles < 2010  . HIV infection ([place]) dx'd 10/2013  . Cellulitis 11/2013    LLE  . CAP (community acquired pneumonia) 10/2013  . DVT (deep venous thrombosis) ([place]) 10/2013    BLE  . Multiple food allergies   . Multiple environmental allergies     "trees, dogs, cats"  . Chronic kidney disease   . 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 Femur fracture surgery Right 09/2001  . Fracture surgery      No family history on file.  Social History:  reports that he has never smoked. He has never used smokeless tobacco. He reports that he drinks alcohol. He reports that he does not use illicit drugs.  Allergies:  Allergies  Allergen Reactions  . Bactrim [Sulfamethoxazole-Trimethoprim] Other (See Comments)    Renal failure  . Cauliflower [Brassica Oleracea Italica] Anaphylaxis and Other (See Comments)    Pt states his throat closes up  . Vancomycin Other (See Comments)    Patient states vancomycin caused kidney injury  . Augmentin [Amoxicillin-Pot Clavulanate] Other (See Comments)    unknown  . Banana Other (See Comments)  . Fish Allergy Hives  . Onion Rash    Medications: I have  reviewed the patient's current medications.  Results for orders placed or performed during the hospital encounter of 12/18/14 (from the past 48 hour(s))  Comprehensive metabolic panel     Status: Abnormal   Collection Time: 12/18/14  5:41 PM  Result Value Ref Range   Sodium 130 (L) 135 - 145 mmol/L   Potassium 4.6 3.5 - 5.1 mmol/L   Chloride 107 101 - 111 mmol/L   CO2 11 (L) 22 - 32 mmol/L   Glucose, Bld 96 65 - 99 mg/dL   BUN 61 (H) 6 - 20 mg/dL   Creatinine, Ser 9.48 (H) 0.61 - 1.24 mg/dL   Calcium 8.6 (L) 8.9 - 10.3 mg/dL   Total Protein 8.7 (H) 6.5 - 8.1 g/dL   Albumin 2.6 (L) 3.5 - 5.0 g/dL   AST 17 15 - 41 U/L   ALT 14 (L) 17 - 63 U/L   Alkaline Phosphatase 32 (L) 38 - 126 U/L   Total Bilirubin 0.8 0.3 - 1.2 mg/dL   GFR calc non Af Amer 7 (L) >60 mL/min   GFR calc Af Amer 8 (L) >60 mL/min    Comment: (NOTE) The eGFR has been calculated using the CKD EPI equation. This calculation has not been validated in all clinical situations. eGFR's persistently <60 mL/min signify possible Chronic Kidney Disease.    Anion gap 12 5 - 15  CBC with Differential     Status: Abnormal   Collection Time: 12/18/14  5:41  PM  Result Value Ref Range   WBC 21.3 (H) 4.0 - 10.5 K/uL   RBC 2.98 (L) 4.22 - 5.81 MIL/uL   Hemoglobin 8.4 (L) 13.0 - 17.0 g/dL   HCT 25.0 (L) 39.0 - 52.0 %   MCV 83.9 78.0 - 100.0 fL   MCH 28.2 26.0 - 34.0 pg   MCHC 33.6 30.0 - 36.0 g/dL   RDW 13.4 11.5 - 15.5 %   Platelets 127 (L) 150 - 400 K/uL   Neutrophils Relative % 90 %   Lymphocytes Relative 6 %   Monocytes Relative 4 %   Eosinophils Relative 0 %   Basophils Relative 0 %   Neutro Abs 19.1 (H) 1.7 - 7.7 K/uL   Lymphs Abs 1.3 0.7 - 4.0 K/uL   Monocytes Absolute 0.9 0.1 - 1.0 K/uL   Eosinophils Absolute 0.0 0.0 - 0.7 K/uL   Basophils Absolute 0.0 0.0 - 0.1 K/uL   WBC Morphology FEW NEUTROPHIL BANDS NOTED   CK     Status: Abnormal   Collection Time: 12/18/14  5:41 PM  Result Value Ref Range   Total CK 440  (H) 49 - 397 U/L  I-Stat Troponin, ED (not at [facility])     Status: None   Collection Time: 12/18/14  5:56 PM  Result Value Ref Range   Troponin i, poc 0.03 0.00 - 0.08 ng/mL   Comment 3            Comment: Due to the release kinetics of cTnI, a negative result within the first hours of the onset of symptoms does not rule out myocardial infarction with certainty. If myocardial infarction is still suspected, repeat the test at appropriate intervals.   I-Stat CG4 Lactic Acid, ED (Not at [facility])     Status: None   Collection Time: 12/18/14  5:58 PM  Result Value Ref Range   Lactic Acid, Venous 1.10 0.5 - 2.0 mmol/L  Urinalysis, Routine w reflex microscopic (not at [facility])     Status: Abnormal   Collection Time: 12/18/14  7:25 PM  Result Value Ref Range   Color, Urine AMBER (A) YELLOW    Comment: BIOCHEMICALS MAY BE AFFECTED BY COLOR   APPearance HAZY (A) CLEAR   Specific Gravity, Urine 1.012 1.005 - 1.030   pH 6.0 5.0 - 8.0   Glucose, UA NEGATIVE NEGATIVE mg/dL   Hgb urine dipstick LARGE (A) NEGATIVE   Bilirubin Urine NEGATIVE NEGATIVE   Ketones, ur NEGATIVE NEGATIVE mg/dL   Protein, ur >300 (A) NEGATIVE mg/dL   Urobilinogen, UA 0.2 0.0 - 1.0 mg/dL   Nitrite NEGATIVE NEGATIVE   Leukocytes, UA NEGATIVE NEGATIVE  Urine microscopic-add on     Status: None   Collection Time: 12/18/14  7:25 PM  Result Value Ref Range   Squamous Epithelial / LPF RARE RARE   WBC, UA 0-2 <3 WBC/hpf   RBC / HPF 21-50 <3 RBC/hpf   Bacteria, UA RARE RARE  Sedimentation rate     Status: Abnormal   Collection Time: 12/18/14  8:48 PM  Result Value Ref Range   Sed Rate 122 (H) 0 - 16 mm/hr  I-Stat CG4 Lactic Acid, ED (Not at [facility])     Status: None   Collection Time: 12/18/14  8:55 PM  Result Value Ref Range   Lactic Acid, Venous 1.04 0.5 - 2.0 mmol/L    Dg Chest 2 View  12/18/2014  CLINICAL DATA:  Shortness breath, fever, tachycardia EXAM: CHEST  2 VIEW COMPARISON:  07/30/2014 FINDINGS: Lungs are clear.  No  pleural effusion or pneumothorax. The heart is normal in size. Visualized osseous structures are within normal limits. IMPRESSION: Normal chest radiographs. Electronically Signed   By: Julian Hy M.D.   On: 12/18/2014 18:37   Dg Lumbar Spine Complete  12/18/2014  CLINICAL DATA:  Status post fall, with injury to the lower back. Initial encounter. EXAM: LUMBAR SPINE - COMPLETE 4+ VIEW COMPARISON:  Lumbar spine radiographs performed 07/30/2014 FINDINGS: There is no evidence of fracture or subluxation. Vertebral bodies demonstrate normal height and alignment. Intervertebral disc spaces are preserved. The visualized neural foramina are grossly unremarkable in appearance. The visualized bowel gas pattern is unremarkable in appearance; air and stool are noted within the colon. The sacroiliac joints are within normal limits. IMPRESSION: No evidence of fracture or subluxation along the lumbar spine. Electronically Signed   By: Garald Balding M.D.   On: 12/18/2014 03:51   Ct Femur Right Wo Contrast  12/18/2014  CLINICAL DATA:  Acute onset right leg pain for 2 days, status post fall. High fever. Initial encounter. EXAM: CT OF THE RIGHT FEMUR WITHOUT CONTRAST TECHNIQUE: Multidetector CT imaging was performed according to the standard protocol. Multiplanar CT image reconstructions were also generated. COMPARISON:  None. FINDINGS: There is marked diffuse enlargement of the right quadriceps musculature. There is a single focus of focal bulging of the superficial musculature, just below the skin surface, measuring approximately 2.8 x 2.3 cm, approximately 14 cm below the level of the right greater femoral trochanter. This most likely reflects a focal abscess. Additional vague decreased attenuation is seen tracking within the deeper quadriceps musculature, with diffuse intramuscular edema extending medially, and underlying pyomyositis cannot be excluded. Diffuse surrounding soft tissue inflammation is seen. It is  difficult to determine how much of this is drainable without contrast. Per clinical correlation, there is no evidence for compartment syndrome. The underlying osseous structures appear grossly intact, with mild chronic deformity involving the proximal right femur. An intramedullary rod and screws appear grossly intact, without evidence of loosening. The right femoral head remains seated at the acetabulum. A trace knee joint effusion is noted, with a few small loose bodies anteriorly. Mild infrapatellar soft tissue swelling is noted. The visualized bowel structures are grossly unremarkable. The bladder is moderately distended and grossly unremarkable in appearance. A small urachal remnant is incidentally noted. Visualized inguinal nodes remain borderline normal in size. The vasculature is not well assessed without contrast. IMPRESSION: 1. Marked diffuse enlargement of the right quadriceps musculature. Single focus of focal bulging of the superficial musculature, just below the skin surface, measuring 2.8 x 2.3 cm, approximately 14 cm below the level of the right greater femoral trochanter at the proximal right thigh. This most likely reflects a focal abscess. Additional vague decreased attenuation within the quadriceps musculature, with diffuse intramuscular edema extending medially. Underlying pyomyositis cannot be excluded. It is difficult to determine how much of this is drainable without contrast. 2. Diffuse surrounding soft tissue inflammation noted about the anterior compartment. No evidence for compartment syndrome, per clinical correlation. 3. No evidence for osteomyelitis. 4. Trace right knee joint effusion, with a few small anterior loose bodies and mild infrapatellar soft tissue swelling. These results were called by telephone at the time of interpretation on 12/18/2014 at 10:15 pm to Dr. Eulis Foster, who verbally acknowledged these results. Electronically Signed   By: Garald Balding M.D.   On: 12/18/2014 22:20     Dg Femur, Min 2 Views Right  12/18/2014  CLINICAL DATA:  Status post fall, with injury to the right thigh. Initial encounter. EXAM: RIGHT FEMUR 2 VIEWS COMPARISON:  None. FINDINGS: There is no evidence of acute fracture or dislocation. The patient's right femoral intramedullary rod and screws appear intact, with associated mild chronic deformity. The right femoral head remains seated at the acetabulum. The knee joint is grossly unremarkable in appearance. No significant joint effusion is identified. A small focus of calcification overlying the anterior distal thigh may reflect a small loose body or vascular calcification. No significant soft tissue abnormalities are characterized on radiograph. IMPRESSION: No evidence of acute fracture or dislocation. Visualized hardware appears grossly intact. Electronically Signed   By: Garald Balding M.D.   On: 12/18/2014 03:52    Review of Systems  Constitutional: Positive for fever.  HENT: Negative.   Respiratory: Negative.   Cardiovascular: Negative.   Genitourinary: Negative.   Musculoskeletal: Positive for myalgias (rle pain).  Skin: Negative.   Neurological: Negative.    Blood pressure 178/97, pulse 124, temperature 102.6 F (39.2 C), temperature source Oral, resp. rate 21, height '5\' 7"'  (1.702 m), weight 86.183 kg (190 lb), SpO2 100 %. Physical Exam  Constitutional: He is oriented to person, place, and time. He appears well-developed and well-nourished.  HENT:  Head: Normocephalic and atraumatic.  Eyes: Conjunctivae and EOM are normal. Pupils are equal, round, and reactive to light.  Neck: Normal range of motion. Neck supple.  Cardiovascular: Normal rate, regular rhythm and normal heart sounds.   Respiratory: Effort normal and breath sounds normal.  GI: Soft. Bowel sounds are normal. He exhibits no distension. There is no tenderness.  Musculoskeletal:       Right knee: He exhibits decreased range of motion.       Legs: Neurological: He is  alert and oriented to person, place, and time.    Assessment/Plan: 34 year old male with AIDS/HIV, chronic kidney disease, with a right lower extremity abscess.  1. Antibiotics per ID 2. We'll proceed to the operative for I&D of right lower extremity abscess. I discussed with the patient this potentially could require multiple washouts as well as extensive debridement. 3. I discussed with the patient the risks benefits the procedure to include but not limited to: Infection, bleeding, damage to surrounding structures, possible need for further debridements the patient voiced understanding and wishes to proceed.   Rosario Jacks., Alfreddie Consalvo 12/18/2014, 10:43 PM

## 2014-12-18 NOTE — Discharge Instructions (Signed)

## 2014-12-18 NOTE — Progress Notes (Signed)
ANTIBIOTIC CONSULT NOTE - INITIAL  Pharmacy Consult for daptomycin and imipenem Indication: deep tissue infection  Allergies  Allergen Reactions  . Bactrim [Sulfamethoxazole-Trimethoprim] Other (See Comments)    Renal failure  . Cauliflower [Brassica Oleracea Italica] Anaphylaxis and Other (See Comments)    Pt states his throat closes up  . Vancomycin Other (See Comments)    Patient states vancomycin caused kidney injury  . Augmentin [Amoxicillin-Pot Clavulanate] Other (See Comments)    unknown  . Banana Other (See Comments)  . Fish Allergy Hives  . Onion Rash    Patient Measurements: Height: 5\' 7"  (170.2 cm) Weight: 190 lb (86.183 kg) IBW/kg (Calculated) : 66.1  Vital Signs: Temp: 102.3 F (39.1 C) (10/21 1729) Temp Source: Oral (10/21 1729) BP: 178/104 mmHg (10/21 2015) Pulse Rate: 120 (10/21 2015) Intake/Output from previous day:   Intake/Output from this shift:    Labs:  Recent Labs  12/18/14 1741  WBC 21.3*  HGB 8.4*  PLT 127*  CREATININE 9.48*   Estimated Creatinine Clearance: 11.8 mL/min (by C-G formula based on Cr of 9.48). No results for input(s): VANCOTROUGH, VANCOPEAK, VANCORANDOM, GENTTROUGH, GENTPEAK, GENTRANDOM, TOBRATROUGH, TOBRAPEAK, TOBRARND, AMIKACINPEAK, AMIKACINTROU, AMIKACIN in the last 72 hours.   Microbiology: No results found for this or any previous visit (from the past 720 hour(s)).  Medical History: Past Medical History  Diagnosis Date  . Shingles < 2010  . HIV infection ([place]) dx'd 10/2013  . Cellulitis 11/2013    LLE  . CAP (community acquired pneumonia) 10/2013  . DVT (deep venous thrombosis) ([place]) 10/2013    BLE  . Multiple food allergies   . Multiple environmental allergies     "trees, dogs, cats"  . Chronic kidney disease   . 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 Presenting to ED with fever and right thigh pain after falling yesterday at work. No evidence of cellulitis, but concern for deep tissue infection/necrotizing fasciitis. Pharmacy consulted to dose imipenem and daptomycin. ID has been consulted, but has not yet seen the patient.  WBC 21.3, febrile to 103, SCr 9.48, CrCl 11  Goal of Therapy:  Resolution of infection, clinical improvement  Plan:  -Imipenem 250 mg q12h -Daptomycin 345 mg (4 mg/kg) x 1 dose prior to ID consult Expected duration 10 days with resolution of temperature and/or normalization of WBC Follow up culture results, LOT, ID physician recommendations/daptomycin approval -F/U potential for initiation of HD   Governor Specking, PharmD Clinical Pharmacy Resident Pager: [phone] 12/18/2014,8:35 PM

## 2014-12-19 DIAGNOSIS — D696 Thrombocytopenia, unspecified: Secondary | ICD-10-CM

## 2014-12-19 DIAGNOSIS — B20 Human immunodeficiency virus [HIV] disease: Secondary | ICD-10-CM | POA: Diagnosis present

## 2014-12-19 DIAGNOSIS — B9689 Other specified bacterial agents as the cause of diseases classified elsewhere: Secondary | ICD-10-CM

## 2014-12-19 DIAGNOSIS — M726 Necrotizing fasciitis: Secondary | ICD-10-CM

## 2014-12-19 DIAGNOSIS — M6009 Infective myositis, multiple sites: Secondary | ICD-10-CM

## 2014-12-19 DIAGNOSIS — N184 Chronic kidney disease, stage 4 (severe): Secondary | ICD-10-CM | POA: Diagnosis present

## 2014-12-19 DIAGNOSIS — N186 End stage renal disease: Secondary | ICD-10-CM

## 2014-12-19 DIAGNOSIS — D72829 Elevated white blood cell count, unspecified: Secondary | ICD-10-CM | POA: Diagnosis present

## 2014-12-19 DIAGNOSIS — I150 Renovascular hypertension: Secondary | ICD-10-CM | POA: Diagnosis present

## 2014-12-19 DIAGNOSIS — E871 Hypo-osmolality and hyponatremia: Secondary | ICD-10-CM | POA: Diagnosis present

## 2014-12-19 DIAGNOSIS — D631 Anemia in chronic kidney disease: Secondary | ICD-10-CM | POA: Diagnosis present

## 2014-12-19 DIAGNOSIS — L02415 Cutaneous abscess of right lower limb: Secondary | ICD-10-CM | POA: Diagnosis present

## 2014-12-19 DIAGNOSIS — M60051 Infective myositis, right thigh: Secondary | ICD-10-CM

## 2014-12-19 LAB — BASIC METABOLIC PANEL
Anion gap: 11 (ref 5–15)
BUN: 62 mg/dL — ABNORMAL HIGH (ref 6–20)
CO2: 14 mmol/L — ABNORMAL LOW (ref 22–32)
Calcium: 8.4 mg/dL — ABNORMAL LOW (ref 8.9–10.3)
Chloride: 107 mmol/L (ref 101–111)
Creatinine, Ser: 9.56 mg/dL — ABNORMAL HIGH (ref 0.61–1.24)
GFR calc Af Amer: 7 mL/min — ABNORMAL LOW (ref >60)
GFR calc non Af Amer: 6 mL/min — ABNORMAL LOW (ref >60)
Glucose, Bld: 94 mg/dL (ref 65–99)
Potassium: 5.1 mmol/L (ref 3.5–5.1)
Sodium: 132 mmol/L — ABNORMAL LOW (ref 135–145)

## 2014-12-19 LAB — BLOOD GAS, ARTERIAL
Acid-base deficit: 12.1 mmol/L — ABNORMAL HIGH (ref 0.0–2.0)
Bicarbonate: 12.8 mEq/L — ABNORMAL LOW (ref 20.0–24.0)
Drawn by: 441381
FIO2: 24
O2 Saturation: 98.4 %
Patient temperature: 100.3
TCO2: 13.6 mmol/L (ref 0–100)
pCO2 arterial: 26.8 mmHg — ABNORMAL LOW (ref 35.0–45.0)
pH, Arterial: 7.306 — ABNORMAL LOW (ref 7.350–7.450)
pO2, Arterial: 120 mmHg — ABNORMAL HIGH (ref 80.0–100.0)

## 2014-12-19 LAB — GRAM STAIN

## 2014-12-19 LAB — CBC
HCT: 25.4 % — ABNORMAL LOW (ref 39.0–52.0)
Hemoglobin: 8.5 g/dL — ABNORMAL LOW (ref 13.0–17.0)
MCH: 28 pg (ref 26.0–34.0)
MCHC: 33.5 g/dL (ref 30.0–36.0)
MCV: 83.6 fL (ref 78.0–100.0)
Platelets: 114 10*3/uL — ABNORMAL LOW (ref 150–400)
RBC: 3.04 MIL/uL — ABNORMAL LOW (ref 4.22–5.81)
RDW: 13.8 % (ref 11.5–15.5)
WBC: 23.6 10*3/uL — ABNORMAL HIGH (ref 4.0–10.5)

## 2014-12-19 LAB — NA AND K (SODIUM & POTASSIUM), RAND UR
Potassium Urine: 34 mmol/L
Sodium, Ur: 36 mmol/L

## 2014-12-19 LAB — OSMOLALITY, URINE: Osmolality, Ur: 305 mOsm/kg — ABNORMAL LOW (ref 390–1090)

## 2014-12-19 MED ORDER — DAPSONE 100 MG PO TABS
100.0000 mg | ORAL_TABLET | Freq: Every day | ORAL | Status: DC
  Administered 2014-12-19 – 2014-12-23 (×5): 100 mg via ORAL
  Filled 2014-12-19 (×6): qty 1

## 2014-12-19 MED ORDER — SODIUM CHLORIDE 0.9 % IV SOLN
500.0000 mg | INTRAVENOUS | Status: DC
  Filled 2014-12-19: qty 10

## 2014-12-19 MED ORDER — DOLUTEGRAVIR SODIUM 50 MG PO TABS
50.0000 mg | ORAL_TABLET | Freq: Every day | ORAL | Status: DC
  Administered 2014-12-19 – 2014-12-23 (×5): 50 mg via ORAL
  Filled 2014-12-19 (×5): qty 1

## 2014-12-19 MED ORDER — DARUNAVIR-COBICISTAT 800-150 MG PO TABS
1.0000 | ORAL_TABLET | Freq: Every day | ORAL | Status: DC
  Administered 2014-12-19 – 2014-12-22 (×4): 1 via ORAL
  Filled 2014-12-19 (×6): qty 1

## 2014-12-19 MED ORDER — PROMETHAZINE HCL 25 MG/ML IJ SOLN: 6.2500 mg | INTRAMUSCULAR | Status: DC | PRN

## 2014-12-19 MED ORDER — ACETAMINOPHEN 325 MG PO TABS
650.0000 mg | ORAL_TABLET | Freq: Four times a day (QID) | ORAL | Status: DC | PRN
  Administered 2014-12-19: 650 mg via ORAL
  Administered 2014-12-20: 325 mg via ORAL
  Administered 2014-12-20: 650 mg via ORAL
  Filled 2014-12-19 (×3): qty 2

## 2014-12-19 MED ORDER — CLINDAMYCIN PHOSPHATE 900 MG/50ML IV SOLN
900.0000 mg | Freq: Three times a day (TID) | INTRAVENOUS | Status: DC
  Administered 2014-12-19 – 2014-12-22 (×9): 900 mg via INTRAVENOUS
  Filled 2014-12-19 (×11): qty 50

## 2014-12-19 MED ORDER — DARUNAVIR-COBICISTAT 800-150 MG PO TABS: 1.0000 | ORAL_TABLET | Freq: Every day | ORAL | Status: DC

## 2014-12-19 MED ORDER — OXYCODONE-ACETAMINOPHEN 7.5-325 MG PO TABS: 1.0000 | ORAL_TABLET | ORAL | Status: DC | PRN

## 2014-12-19 MED ORDER — ALUM & MAG HYDROXIDE-SIMETH 200-200-20 MG/5ML PO SUSP: 30.0000 mL | Freq: Four times a day (QID) | ORAL | Status: DC | PRN

## 2014-12-19 MED ORDER — SODIUM CHLORIDE 0.9 % IV SOLN
INTRAVENOUS | Status: AC
  Administered 2014-12-19: 22:00:00 via INTRAVENOUS

## 2014-12-19 MED ORDER — HYDROMORPHONE HCL 1 MG/ML IJ SOLN: 0.2500 mg | INTRAMUSCULAR | Status: DC | PRN

## 2014-12-19 MED ORDER — OXYCODONE-ACETAMINOPHEN 5-325 MG PO TABS
1.0000 | ORAL_TABLET | ORAL | Status: DC | PRN
  Administered 2014-12-19 – 2014-12-21 (×8): 1 via ORAL
  Filled 2014-12-19 (×8): qty 1

## 2014-12-19 MED ORDER — AZITHROMYCIN 600 MG PO TABS
1200.0000 mg | ORAL_TABLET | ORAL | Status: DC
  Administered 2014-12-20: 1200 mg via ORAL
  Filled 2014-12-19: qty 2

## 2014-12-19 MED ORDER — OXYCODONE HCL 5 MG PO TABS
2.5000 mg | ORAL_TABLET | ORAL | Status: DC | PRN
  Administered 2014-12-20 – 2014-12-21 (×2): 2.5 mg via ORAL
  Filled 2014-12-19 (×2): qty 1

## 2014-12-19 MED ORDER — ONDANSETRON HCL 4 MG PO TABS: 4.0000 mg | ORAL_TABLET | Freq: Four times a day (QID) | ORAL | Status: DC | PRN

## 2014-12-19 MED ORDER — OXYCODONE HCL 5 MG/5ML PO SOLN: 5.0000 mg | Freq: Once | ORAL | Status: DC | PRN

## 2014-12-19 MED ORDER — HYDROMORPHONE HCL 1 MG/ML IJ SOLN: 0.5000 mg | INTRAMUSCULAR | Status: DC | PRN

## 2014-12-19 MED ORDER — HYDRALAZINE HCL 20 MG/ML IJ SOLN: 2.0000 mg | Freq: Four times a day (QID) | INTRAMUSCULAR | Status: DC | PRN

## 2014-12-19 MED ORDER — HYDROMORPHONE HCL 1 MG/ML IJ SOLN: 2.0000 mg | INTRAMUSCULAR | Status: DC | PRN

## 2014-12-19 MED ORDER — OXYCODONE HCL 5 MG PO TABS: 5.0000 mg | ORAL_TABLET | Freq: Once | ORAL | Status: DC | PRN

## 2014-12-19 MED ORDER — ACETAMINOPHEN 650 MG RE SUPP: 650.0000 mg | Freq: Four times a day (QID) | RECTAL | Status: DC | PRN

## 2014-12-19 MED ORDER — ABACAVIR SULFATE 300 MG PO TABS
600.0000 mg | ORAL_TABLET | Freq: Every day | ORAL | Status: DC
  Administered 2014-12-19 – 2014-12-23 (×5): 600 mg via ORAL
  Filled 2014-12-19 (×5): qty 2

## 2014-12-19 MED ORDER — AMLODIPINE BESYLATE 5 MG PO TABS
5.0000 mg | ORAL_TABLET | Freq: Every day | ORAL | Status: DC
  Administered 2014-12-19 – 2014-12-23 (×5): 5 mg via ORAL
  Filled 2014-12-19: qty 2
  Filled 2014-12-19: qty 1
  Filled 2014-12-19: qty 2
  Filled 2014-12-19 (×3): qty 1

## 2014-12-19 MED ORDER — SODIUM CHLORIDE 0.9 % IV SOLN: INTRAVENOUS | Status: DC

## 2014-12-19 MED ORDER — DIPHENHYDRAMINE HCL 25 MG PO CAPS
25.0000 mg | ORAL_CAPSULE | Freq: Four times a day (QID) | ORAL | Status: DC | PRN
  Administered 2014-12-19 – 2014-12-20 (×3): 25 mg via ORAL
  Filled 2014-12-19 (×2): qty 1

## 2014-12-19 MED ORDER — ONDANSETRON HCL 4 MG/2ML IJ SOLN
4.0000 mg | Freq: Four times a day (QID) | INTRAMUSCULAR | Status: DC | PRN
  Administered 2014-12-20 – 2014-12-22 (×2): 4 mg via INTRAVENOUS
  Filled 2014-12-19 (×2): qty 2

## 2014-12-19 MED ORDER — 0.9 % SODIUM CHLORIDE (POUR BTL) OPTIME
TOPICAL | Status: DC | PRN
Start: 2014-12-18 — End: 2014-12-19
  Administered 2014-12-18: 1000 mL

## 2014-12-19 MED ORDER — SODIUM BICARBONATE 650 MG PO TABS
650.0000 mg | ORAL_TABLET | Freq: Three times a day (TID) | ORAL | Status: DC
  Administered 2014-12-19 – 2014-12-23 (×13): 650 mg via ORAL
  Filled 2014-12-19 (×13): qty 1

## 2014-12-19 MED ORDER — OXYCODONE HCL 5 MG PO TABS
5.0000 mg | ORAL_TABLET | ORAL | Status: DC | PRN
  Administered 2014-12-19 – 2014-12-22 (×7): 5 mg via ORAL
  Filled 2014-12-19 (×7): qty 1

## 2014-12-19 NOTE — Progress Notes (Signed)
Spoke to Dr. Rogue Bussing to clarify MRI order for this AM. Dr. Rogue Bussing said to hold off on MRI until rounding MD comes this AM and let them decide.  [place]

## 2014-12-19 NOTE — Progress Notes (Signed)
Patient admitted after midnight. Chart reviewed. Patient examined.  Principal Problem:   Right thigh abscess/myositis. On primaxin, daptomycin, clinda per ID. 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 No uremic signs or symptoms. Have consulted nephrology in case they want to plan access while inpatient.   Thrombocytopenia ([place])   AIDS: CD4 60, quant 1419 in June. Continue home meds   Renovascular hypertension   Anemia due to end stage renal disease ([place])   Hyponatremia

## 2014-12-19 NOTE — Progress Notes (Signed)
1 Day Post-Op I&D R thigh fluid collection Subjective: Pain slightly better  Objective: Vital signs in last 24 hours: Temp:  [98.1 F (36.7 C)-103 F (39.4 C)] 99.8 F (37.7 C) (10/22 0520) Pulse Rate:  [116-126] 121 (10/22 0648) Resp:  [15-27] 16 (10/22 0520) BP: (141-178)/(68-104) 141/83 mmHg (10/22 0520) SpO2:  [98 %-100 %] 100 % (10/22 0520) Weight:  [86.183 kg (190 lb)-87.4 kg (192 lb 10.9 oz)] 87.4 kg (192 lb 10.9 oz) (10/22 0119)   Intake/Output from previous day: 10/21 0701 - 10/22 0700 In: 520 [P.O.:320; I.V.:200] Out: 25 [Blood:25] Intake/Output this shift: Total I/O In: 240 [P.O.:240] Out: 550 [Urine:550]   General appearance: alert and cooperative Extremities: no edema  Incision: no significant drainage  Lab Results:   Recent Labs  12/18/14 1741 12/19/14 0442  WBC 21.3* 23.6*  HGB 8.4* 8.5*  HCT 25.0* 25.4*  PLT 127* 114*   BMET  Recent Labs  12/18/14 1741 12/19/14 0442  NA 130* 132*  K 4.6 5.1  CL 107 107  CO2 11* 14*  GLUCOSE 96 94  BUN 61* 62*  CREATININE 9.48* 9.56*  CALCIUM 8.6* 8.4*   PT/INR No results for input(s): LABPROT, INR in the last 72 hours. ABG  Recent Labs  12/19/14 0342  PHART 7.306*  HCO3 12.8*    MEDS, Scheduled . sodium chloride   Intravenous STAT  . abacavir  600 mg Oral Daily  . amLODipine  5 mg Oral Daily  . darunavir-cobicistat  1 tablet Oral Daily  . dolutegravir  50 mg Oral Daily  . imipenem-cilastatin  250 mg Intravenous Q12H    Studies/Results: Dg Chest 2 View  12/18/2014  CLINICAL DATA:  Shortness breath, fever, tachycardia EXAM: CHEST  2 VIEW COMPARISON:  07/30/2014 FINDINGS: Lungs are clear.  No pleural effusion or pneumothorax. The heart is normal in size. Visualized osseous structures are within normal limits. IMPRESSION: Normal chest radiographs. Electronically Signed   By: Julian Hy M.D.   On: 12/18/2014 18:37   Dg Lumbar Spine Complete  12/18/2014  CLINICAL DATA:  Status post  fall, with injury to the lower back. Initial encounter. EXAM: LUMBAR SPINE - COMPLETE 4+ VIEW COMPARISON:  Lumbar spine radiographs performed 07/30/2014 FINDINGS: There is no evidence of fracture or subluxation. Vertebral bodies demonstrate normal height and alignment. Intervertebral disc spaces are preserved. The visualized neural foramina are grossly unremarkable in appearance. The visualized bowel gas pattern is unremarkable in appearance; air and stool are noted within the colon. The sacroiliac joints are within normal limits. IMPRESSION: No evidence of fracture or subluxation along the lumbar spine. Electronically Signed   By: Garald Balding M.D.   On: 12/18/2014 03:51   Ct Femur Right Wo Contrast  12/18/2014  CLINICAL DATA:  Acute onset right leg pain for 2 days, status post fall. High fever. Initial encounter. EXAM: CT OF THE RIGHT FEMUR WITHOUT CONTRAST TECHNIQUE: Multidetector CT imaging was performed according to the standard protocol. Multiplanar CT image reconstructions were also generated. COMPARISON:  None. FINDINGS: There is marked diffuse enlargement of the right quadriceps musculature. There is a single focus of focal bulging of the superficial musculature, just below the skin surface, measuring approximately 2.8 x 2.3 cm, approximately 14 cm below the level of the right greater femoral trochanter. This most likely reflects a focal abscess. Additional vague decreased attenuation is seen tracking within the deeper quadriceps musculature, with diffuse intramuscular edema extending medially, and underlying pyomyositis cannot be excluded. Diffuse surrounding soft tissue inflammation is  seen. It is difficult to determine how much of this is drainable without contrast. Per clinical correlation, there is no evidence for compartment syndrome. The underlying osseous structures appear grossly intact, with mild chronic deformity involving the proximal right femur. An intramedullary rod and screws appear  grossly intact, without evidence of loosening. The right femoral head remains seated at the acetabulum. A trace knee joint effusion is noted, with a few small loose bodies anteriorly. Mild infrapatellar soft tissue swelling is noted. The visualized bowel structures are grossly unremarkable. The bladder is moderately distended and grossly unremarkable in appearance. A small urachal remnant is incidentally noted. Visualized inguinal nodes remain borderline normal in size. The vasculature is not well assessed without contrast. IMPRESSION: 1. Marked diffuse enlargement of the right quadriceps musculature. Single focus of focal bulging of the superficial musculature, just below the skin surface, measuring 2.8 x 2.3 cm, approximately 14 cm below the level of the right greater femoral trochanter at the proximal right thigh. This most likely reflects a focal abscess. Additional vague decreased attenuation within the quadriceps musculature, with diffuse intramuscular edema extending medially. Underlying pyomyositis cannot be excluded. It is difficult to determine how much of this is drainable without contrast. 2. Diffuse surrounding soft tissue inflammation noted about the anterior compartment. No evidence for compartment syndrome, per clinical correlation. 3. No evidence for osteomyelitis. 4. Trace right knee joint effusion, with a few small anterior loose bodies and mild infrapatellar soft tissue swelling. These results were called by telephone at the time of interpretation on 12/18/2014 at 10:15 pm to Dr. Eulis Foster, who verbally acknowledged these results. Electronically Signed   By: Garald Balding M.D.   On: 12/18/2014 22:20   Dg Femur, Min 2 Views Right  12/18/2014  CLINICAL DATA:  Status post fall, with injury to the right thigh. Initial encounter. EXAM: RIGHT FEMUR 2 VIEWS COMPARISON:  None. FINDINGS: There is no evidence of acute fracture or dislocation. The patient's right femoral intramedullary rod and screws  appear intact, with associated mild chronic deformity. The right femoral head remains seated at the acetabulum. The knee joint is grossly unremarkable in appearance. No significant joint effusion is identified. A small focus of calcification overlying the anterior distal thigh may reflect a small loose body or vascular calcification. No significant soft tissue abnormalities are characterized on radiograph. IMPRESSION: No evidence of acute fracture or dislocation. Visualized hardware appears grossly intact. Electronically Signed   By: Garald Balding M.D.   On: 12/18/2014 03:52    Assessment: s/p Procedure(s): IRRIGATION AND DEBRIDEMENT RIGHT LOWER EXTREMITY Patient Active Problem List   Diagnosis Date Noted  . Abscess of right thigh 12/19/2014  . HIV infection ([place]) 12/19/2014  . Chronic kidney disease (CKD), stage IV (severe) ([place]) 12/19/2014  . Leukocytosis 12/19/2014  . 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 Anemia due to end stage renal disease ([place]) 12/19/2014  . Hyponatremia 12/19/2014  . Pyomyositis 12/18/2014  . Community acquired pneumonia 07/30/2014  . Sepsis ([place]) 07/30/2014  . Metabolic acidosis [id number]  . Anemia of chronic disease 07/30/2014  . Thrombocytopenia ([place]) 07/30/2014  . Acute on chronic renal failure ([place])   . 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 Human immunodeficiency virus (HIV) disease ([place]) 12/11/2013  . Hospital discharge follow-up 12/11/2013  . CMV (cytomegalovirus infection) ([place]) 12/07/2013  . Protein-calorie malnutrition, severe ([place]) 12/06/2013  . 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 DVT (deep venous thrombosis) ([place]) 11/24/2013  . Asthma, chronic 11/24/2013  . Acquired immune deficiency  syndrome ([place]) 11/14/2013  . AKI (acute kidney injury) ([place]) 11/11/2013  . Elevated liver enzymes 11/09/2013  . History of shingles 11/09/2013  . Obesity 11/09/2013  . Pneumonia   . Asthma     S/p  I&D R thigh abscess  Plan: Gram stain shows GPC, cultures pending  Change dressing daily.  [place] drain left in wound to encourage drainage Abx per ID   LOS: 1 day     .Rosario Adie, MD [facility], [place]   12/19/2014 10:10 AM

## 2014-12-19 NOTE — Consult Note (Addendum)
[facility]    Date of Admission:  12/18/2014  Date of Consult:  12/19/2014  Reason for Consult: Necrotizing deep tissue infection in patient with AIDS Referring Physician: Dr. Conley Canal   HPI: Jim Wyatt is an 34 y.o. male with HIV and AIDS chronic kidney disease on border of needing hemodialysis but refusing to consider this who was last seen in our clinic in late June. [facility] emerge department yesterday with 2 days of fever chills and increased pain in his right thigh. There was concern and rightfully so for potential necrotizing-I despite emerge department staff and they called me for recommendations for empiric antibiotics in this patient who has apparent penicillin allergies and he was changed over to daptomycin, clindamycin and imipenem. CT scan showed  enlargement of the right quadriceps musculature. Single focus of focal bulging of the superficial musculature, just below the skin surface, measuring 2.8 x 2.3 cm, approximately 14 cm below the level of the right greater femoral trochanter at the proximal right thigh. This most likely reflects a focal abscess. Additional vague decreased attenuation within the quadriceps musculature, with diffuse intramuscular edema extending medially. Underlying pyomyositis cannot be excluded. It is difficult to determine how much of this is drainable without contrast. 2. Diffuse surrounding soft tissue inflammation noted about the anterior compartment. No evidence for compartment syndrome, per clinical correlation. 3. No evidence for osteomyelitis. 4. Trace right knee joint effusion, with a few small anterior loose bodies and mild infrapatellar soft tissue swelling.  He was taken promptly operating room last night by general surgery performed irrigation and debridement of right quadriceps abscess and debridement of myositis in the muscle.  Cultures from the OR have shown gram-positive cocci  in pairs and chains and he has been narrowed to daptomycin and clindamycin.  I was called by the emerge department and then last night again around 2:30 by the hospitalist with regards to recommendations of narrowing of antibiotics and now seeing the patient formally. I noticed that one of anti-his antiretrovirals was not initially ordered and I placed an order for this early am.  Past Medical History  Diagnosis Date  . Shingles < 2010  . HIV infection ([place]) dx'd 10/2013  . Cellulitis 11/2013    LLE  . CAP (community acquired pneumonia) 10/2013  . DVT (deep venous thrombosis) ([place]) 10/2013    BLE  . Multiple food allergies   . Multiple environmental allergies     "trees, dogs, cats"  . Chronic kidney disease   . 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 Femur fracture surgery Right 09/2001  . Fracture surgery      Social History:  reports that he has never smoked. He has never used smokeless tobacco. He reports that he drinks alcohol. He reports that he does not use illicit drugs.   No family history on file.  Allergies  Allergen Reactions  . Bactrim [Sulfamethoxazole-Trimethoprim] Other (See Comments)    Renal failure  . Cauliflower [Brassica Oleracea Italica] Anaphylaxis and Other (See Comments)    Pt states his throat closes up  . Vancomycin Other (See Comments)    Patient states vancomycin caused kidney injury  . Augmentin [Amoxicillin-Pot Clavulanate] Other (See Comments)    unknown  . Banana Other (See Comments)  . Fish Allergy Hives  . Onion Rash     Medications: I have reviewed patients current medications as  documented in Epic Anti-infectives    Start     Dose/Rate Route Frequency Ordered Stop   12/20/14 2200  DAPTOmycin (CUBICIN) 500 mg in sodium chloride 0.9 % IVPB     500 mg 220 mL/hr over 30 Minutes Intravenous Every 48 hours 12/19/14 1017     12/20/14 1000  azithromycin (ZITHROMAX) tablet 1,200 mg       1,200 mg Oral Weekly 12/19/14 1031     12/19/14 1045  dapsone tablet 100 mg     100 mg Oral Daily 12/19/14 1031     12/19/14 1045  darunavir-cobicistat (PREZCOBIX) 800-150 MG per tablet 1 tablet  Status:  Discontinued     1 tablet Oral Daily 12/19/14 1031 12/19/14 1033   12/19/14 1000  abacavir (ZIAGEN) tablet 600 mg     600 mg Oral Daily 12/19/14 0115     12/19/14 1000  dolutegravir (TIVICAY) tablet 50 mg     50 mg Oral Daily 12/19/14 0115     12/19/14 1000  darunavir-cobicistat (PREZCOBIX) 800-150 MG per tablet 1 tablet     1 tablet Oral Daily 12/19/14 0357     12/18/14 2130  DAPTOmycin (CUBICIN) 345 mg in sodium chloride 0.9 % IVPB     4 mg/kg  86.2 kg 213.8 mL/hr over 30 Minutes Intravenous  Once 12/18/14 2056 12/18/14 2318   12/18/14 2100  imipenem-cilastatin (PRIMAXIN) 250 mg in sodium chloride 0.9 % 100 mL IVPB     250 mg 200 mL/hr over 30 Minutes Intravenous Every 12 hours 12/18/14 2037     12/18/14 2030  clindamycin (CLEOCIN) IVPB 900 mg     900 mg 100 mL/hr over 30 Minutes Intravenous  Once 12/18/14 2028 12/18/14 2148         ROS: As in history present illness otherwise 12 point review systems negative   Blood pressure 177/95, pulse 134, temperature 102.1 F (38.9 C), temperature source Oral, resp. rate 16, height _0  (1.702 m), weight 192 lb 10.9 oz (87.4 kg), SpO2 100 %. General: Alert and awake, oriented x3, not in any acute distress. HEENT: anicteric sclera,  EOMI, oropharynx clear and without exudate Cardiovascular: egular rate, normal r,  no murmur rubs or gallops Pulmonary: clear to auscultation bilaterally, no wheezing, rales or rhonchi Gastrointestinal: soft nontender, nondistended, normal bowel sounds, Musculoskeletal: thigh with dressing  Neuro: nonfocal, strength and sensation intact   Results for orders placed or performed during the hospital encounter of 12/18/14 (from the past 48 hour(s))  Comprehensive metabolic panel     Status: Abnormal    Collection Time: 12/18/14  5:41 PM  Result Value Ref Range   Sodium 130 (L) 135 - 145 mmol/L   Potassium 4.6 3.5 - 5.1 mmol/L   Chloride 107 101 - 111 mmol/L   CO2 11 (L) 22 - 32 mmol/L   Glucose, Bld 96 65 - 99 mg/dL   BUN 61 (H) 6 - 20 mg/dL   Creatinine, Ser 9.48 (H) 0.61 - 1.24 mg/dL   Calcium 8.6 (L) 8.9 - 10.3 mg/dL   Total Protein 8.7 (H) 6.5 - 8.1 g/dL   Albumin 2.6 (L) 3.5 - 5.0 g/dL   AST 17 15 - 41 U/L   ALT 14 (L) 17 - 63 U/L   Alkaline Phosphatase 32 (L) 38 - 126 U/L   Total Bilirubin 0.8 0.3 - 1.2 mg/dL   GFR calc non Af Amer 7 (L) >60 mL/min   GFR calc Af Amer 8 (L) >60 mL/min  Comment: (NOTE) The eGFR has been calculated using the CKD EPI equation. This calculation has not been validated in all clinical situations. eGFR's persistently <60 mL/min signify possible Chronic Kidney Disease.    Anion gap 12 5 - 15  CBC with Differential     Status: Abnormal   Collection Time: 12/18/14  5:41 PM  Result Value Ref Range   WBC 21.3 (H) 4.0 - 10.5 K/uL   RBC 2.98 (L) 4.22 - 5.81 MIL/uL   Hemoglobin 8.4 (L) 13.0 - 17.0 g/dL   HCT 25.0 (L) 39.0 - 52.0 %   MCV 83.9 78.0 - 100.0 fL   MCH 28.2 26.0 - 34.0 pg   MCHC 33.6 30.0 - 36.0 g/dL   RDW 13.4 11.5 - 15.5 %   Platelets 127 (L) 150 - 400 K/uL   Neutrophils Relative % 90 %   Lymphocytes Relative 6 %   Monocytes Relative 4 %   Eosinophils Relative 0 %   Basophils Relative 0 %   Neutro Abs 19.1 (H) 1.7 - 7.7 K/uL   Lymphs Abs 1.3 0.7 - 4.0 K/uL   Monocytes Absolute 0.9 0.1 - 1.0 K/uL   Eosinophils Absolute 0.0 0.0 - 0.7 K/uL   Basophils Absolute 0.0 0.0 - 0.1 K/uL   WBC Morphology FEW NEUTROPHIL BANDS NOTED   Culture, blood (routine x 2)     Status: None (Preliminary result)   Collection Time: 12/18/14  5:41 PM  Result Value Ref Range   Specimen Description BLOOD RIGHT HAND    Special Requests BOTTLES DRAWN AEROBIC AND ANAEROBIC 5CC    Culture NO GROWTH < 24 HOURS    Report Status PENDING   CK     Status:  Abnormal   Collection Time: 12/18/14  5:41 PM  Result Value Ref Range   Total CK 440 (H) 49 - 397 U/L  I-Stat Troponin, ED (not at [facility])     Status: None   Collection Time: 12/18/14  5:56 PM  Result Value Ref Range   Troponin i, poc 0.03 0.00 - 0.08 ng/mL   Comment 3            Comment: Due to the release kinetics of cTnI, a negative result within the first hours of the onset of symptoms does not rule out myocardial infarction with certainty. If myocardial infarction is still suspected, repeat the test at appropriate intervals.   I-Stat CG4 Lactic Acid, ED (Not at [facility])     Status: None   Collection Time: 12/18/14  5:58 PM  Result Value Ref Range   Lactic Acid, Venous 1.10 0.5 - 2.0 mmol/L  Culture, blood (routine x 2)     Status: None (Preliminary result)   Collection Time: 12/18/14  6:00 PM  Result Value Ref Range   Specimen Description BLOOD RIGHT ARM    Special Requests BOTTLES DRAWN AEROBIC AND ANAEROBIC 5CC    Culture NO GROWTH < 24 HOURS    Report Status PENDING   Urine culture     Status: None (Preliminary result)   Collection Time: 12/18/14  7:10 PM  Result Value Ref Range   Specimen Description URINE, RANDOM    Special Requests NONE    Culture CULTURE REINCUBATED FOR BETTER GROWTH    Report Status PENDING   Urinalysis, Routine w reflex microscopic (not at [facility])     Status: Abnormal   Collection Time: 12/18/14  7:25 PM  Result Value Ref Range   Color, Urine AMBER (A) YELLOW  Comment: BIOCHEMICALS MAY BE AFFECTED BY COLOR   APPearance HAZY (A) CLEAR   Specific Gravity, Urine 1.012 1.005 - 1.030   pH 6.0 5.0 - 8.0   Glucose, UA NEGATIVE NEGATIVE mg/dL   Hgb urine dipstick LARGE (A) NEGATIVE   Bilirubin Urine NEGATIVE NEGATIVE   Ketones, ur NEGATIVE NEGATIVE mg/dL   Protein, ur >300 (A) NEGATIVE mg/dL   Urobilinogen, UA 0.2 0.0 - 1.0 mg/dL   Nitrite NEGATIVE NEGATIVE   Leukocytes, UA NEGATIVE NEGATIVE  Urine microscopic-add on     Status: None   Collection  Time: 12/18/14  7:25 PM  Result Value Ref Range   Squamous Epithelial / LPF RARE RARE   WBC, UA 0-2 <3 WBC/hpf   RBC / HPF 21-50 <3 RBC/hpf   Bacteria, UA RARE RARE  Sedimentation rate     Status: Abnormal   Collection Time: 12/18/14  8:48 PM  Result Value Ref Range   Sed Rate 122 (H) 0 - 16 mm/hr  I-Stat CG4 Lactic Acid, ED (Not at [facility])     Status: None   Collection Time: 12/18/14  8:55 PM  Result Value Ref Range   Lactic Acid, Venous 1.04 0.5 - 2.0 mmol/L  Anaerobic culture     Status: None (Preliminary result)   Collection Time: 12/18/14 11:55 PM  Result Value Ref Range   Specimen Description WOUND RIGHT THIGH    Special Requests PT ON DAPTOMYOIN,CLINDAMYCIN AND PRIMAXIN    Gram Stain PENDING    Culture PENDING    Report Status PENDING   Gram stain     Status: None   Collection Time: 12/18/14 11:55 PM  Result Value Ref Range   Specimen Description WOUND    Special Requests NONE    Gram Stain      MODERATE WBC PRESENT,BOTH PMN AND MONONUCLEAR FEW GRAM POSITIVE COCCI IN PAIRS IN CHAINS Gram Stain Report Called to,Read Back By and Verified With: LOURDIS _0  12/19/14 MKELLY    Report Status 12/19/2014 FINAL   Culture, routine-abscess     Status: None (Preliminary result)   Collection Time: 12/18/14 11:55 PM  Result Value Ref Range   Specimen Description WOUND RIGHT THIGH    Special Requests PT ON DAPTOMYCIN,CLINDAMYCIN AND PRIMAXIN    Gram Stain PENDING    Culture PENDING    Report Status PENDING   Blood gas, arterial     Status: Abnormal   Collection Time: 12/19/14  3:42 AM  Result Value Ref Range   FIO2 24.00    Delivery systems NASAL CANNULA    pH, Arterial 7.306 (L) 7.350 - 7.450   pCO2 arterial 26.8 (L) 35.0 - 45.0 mmHg   pO2, Arterial 120 (H) 80.0 - 100.0 mmHg   Bicarbonate 12.8 (L) 20.0 - 24.0 mEq/L   TCO2 13.6 0 - 100 mmol/L   Acid-base deficit 12.1 (H) 0.0 - 2.0 mmol/L   O2 Saturation 98.4 %   Patient temperature 100.3    Collection site LEFT RADIAL     Drawn by [id number]    Sample type ARTERIAL DRAW    Allens test (pass/fail) PASS PASS  Basic metabolic panel     Status: Abnormal   Collection Time: 12/19/14  4:42 AM  Result Value Ref Range   Sodium 132 (L) 135 - 145 mmol/L   Potassium 5.1 3.5 - 5.1 mmol/L   Chloride 107 101 - 111 mmol/L   CO2 14 (L) 22 - 32 mmol/L   Glucose, Bld 94 65 - 99 mg/dL  BUN 62 (H) 6 - 20 mg/dL   Creatinine, Ser 9.56 (H) 0.61 - 1.24 mg/dL   Calcium 8.4 (L) 8.9 - 10.3 mg/dL   GFR calc non Af Amer 6 (L) >60 mL/min   GFR calc Af Amer 7 (L) >60 mL/min    Comment: (NOTE) The eGFR has been calculated using the CKD EPI equation. This calculation has not been validated in all clinical situations. eGFR's persistently <60 mL/min signify possible Chronic Kidney Disease.    Anion gap 11 5 - 15  CBC     Status: Abnormal   Collection Time: 12/19/14  4:42 AM  Result Value Ref Range   WBC 23.6 (H) 4.0 - 10.5 K/uL   RBC 3.04 (L) 4.22 - 5.81 MIL/uL   Hemoglobin 8.5 (L) 13.0 - 17.0 g/dL   HCT 25.4 (L) 39.0 - 52.0 %   MCV 83.6 78.0 - 100.0 fL   MCH 28.0 26.0 - 34.0 pg   MCHC 33.5 30.0 - 36.0 g/dL   RDW 13.8 11.5 - 15.5 %   Platelets 114 (L) 150 - 400 K/uL    Comment: CONSISTENT WITH PREVIOUS RESULT  Osmolality, urine     Status: Abnormal   Collection Time: 12/19/14  7:50 AM  Result Value Ref Range   Osmolality, Ur 305 (L) 390 - 1090 mOsm/kg    Comment: Performed at [facility]  Na and K (sodium & potassium), rand urine     Status: None   Collection Time: 12/19/14  7:51 AM  Result Value Ref Range   Sodium, Ur 36 mmol/L   Potassium Urine Timed 34 mmol/L   _0 (sdes,specrequest,cult,reptstatus)   ) Recent Results (from the past 720 hour(s))  Culture, blood (routine x 2)     Status: None (Preliminary result)   Collection Time: 12/18/14  5:41 PM  Result Value Ref Range Status   Specimen Description BLOOD RIGHT HAND  Final   Special Requests BOTTLES DRAWN AEROBIC AND ANAEROBIC 5CC  Final    Culture NO GROWTH < 24 HOURS  Final   Report Status PENDING  Incomplete  Culture, blood (routine x 2)     Status: None (Preliminary result)   Collection Time: 12/18/14  6:00 PM  Result Value Ref Range Status   Specimen Description BLOOD RIGHT ARM  Final   Special Requests BOTTLES DRAWN AEROBIC AND ANAEROBIC 5CC  Final   Culture NO GROWTH < 24 HOURS  Final   Report Status PENDING  Incomplete  Urine culture     Status: None (Preliminary result)   Collection Time: 12/18/14  7:10 PM  Result Value Ref Range Status   Specimen Description URINE, RANDOM  Final   Special Requests NONE  Final   Culture CULTURE REINCUBATED FOR BETTER GROWTH  Final   Report Status PENDING  Incomplete  Anaerobic culture     Status: None (Preliminary result)   Collection Time: 12/18/14 11:55 PM  Result Value Ref Range Status   Specimen Description WOUND RIGHT THIGH  Final   Special Requests PT ON DAPTOMYOIN,CLINDAMYCIN AND PRIMAXIN  Final   Gram Stain PENDING  Incomplete   Culture PENDING  Incomplete   Report Status PENDING  Incomplete  Gram stain     Status: None   Collection Time: 12/18/14 11:55 PM  Result Value Ref Range Status   Specimen Description WOUND  Final   Special Requests NONE  Final   Gram Stain   Final    MODERATE WBC PRESENT,BOTH PMN AND MONONUCLEAR FEW GRAM POSITIVE  COCCI IN PAIRS IN CHAINS Gram Stain Report Called to,Read Back By and Verified With: LOURDIS _0  12/19/14 MKELLY    Report Status 12/19/2014 FINAL  Final  Culture, routine-abscess     Status: None (Preliminary result)   Collection Time: 12/18/14 11:55 PM  Result Value Ref Range Status   Specimen Description WOUND RIGHT THIGH  Final   Special Requests PT ON DAPTOMYCIN,CLINDAMYCIN AND PRIMAXIN  Final   Gram Stain PENDING  Incomplete   Culture PENDING  Incomplete   Report Status PENDING  Incomplete     Impression/Recommendation  Principal Problem:   Pyomyositis Active Problems:   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 male with  fulminant onset of abscess in muscle with myositis cultures growing gram-positive cocci in pairs and chains consistent with streptococcal infection though we've not seen any growth of the specific organism yet. He also has HIV and AIDS that has been fairly poorly controlled though he endorses being compliant with therapy for the last 2 months.  He also has chronic kidney disease and is in need of hemodialysis. He seems to think that he will get a donor kidney have told him he will likely need dialysis at least as a temporary measure and certainly would not qualify for renal transplantation as of 8 if his HIV remains uncontrolled. 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  #1 Severe infection with abscess myositis:   Greatly appreciate general surgery's critical help here.  We'll continue daptomycin and clindamycin for now and hopefully narrow  If he grows a sensitive strep or staph I will chanllenge him with cephalosporin as his allergy list to these antibiotics are very low. And he can't even recall his allergies other than stating that he believes he had a childhood allergy to penicillin  #2 HIV and AIDS: Continue his current salvage regimen of TivicayPREZCOBIX and abacavir, dapsone for PCP  #3 CKD: There is a disconnect in my mind and his understanding of his disease here I have told him that if he does not have emergent dialysis should the need arise that he will die and that donor kidney his mentation is not likely to happen anytime soon.  I spent greater than 80 minutes with the patient including greater than 50% of time in face to  face counsel of the patients and in coordination of their care.        12/19/2014, 2:18 PM   Thank you so much for this interesting consult  [place] [facility] [place] [phone] (pager) [phone] (office) 12/19/2014, 2:18 PM  Rhina [place] 12/19/2014, 2:18 PM

## 2014-12-19 NOTE — Transfer of Care (Signed)
Immediate Anesthesia Transfer of Care Note  Patient: Jim Wyatt  Procedure(s) Performed: Procedure(s): IRRIGATION AND DEBRIDEMENT RIGHT LOWER EXTREMITY (Right)  Patient Location: PACU  Anesthesia Type:General  Level of Consciousness: sedated and responds to stimulation  Airway & Oxygen Therapy: Patient connected to nasal cannula oxygen  Post-op Assessment: Report given to RN and Post -op Vital signs reviewed and stable  Post vital signs: Reviewed and stable  Last Vitals:  Filed Vitals:   12/18/14 2148  BP:   Pulse:   Temp: 39.2 C  Resp:     Complications: No apparent anesthesia complications

## 2014-12-19 NOTE — Op Note (Signed)
12/18/2014  12:06 AM  PATIENT:  Jim Wyatt  34 y.o. male  PRE-OPERATIVE DIAGNOSIS:  right lower leg abscess  POST-OPERATIVE DIAGNOSIS:  Right quadriceps abscess, myositis, edema  PROCEDURE:  Procedure(s): IRRIGATION AND DEBRIDEMENT RIGHT LOWER EXTREMITY (Right)  SURGEON:  Surgeon(s) and Role:    * Ralene Ok, MD - Primary  ANESTHESIA:   general  EBL:  Total I/O In: 200 [I.V.:200] Out: 25 [Blood:10cc]  BLOOD ADMINISTERED:none  DRAINS: half-inch Penrose drain placed within the quadriceps compartment laterally.    LOCAL MEDICATIONS USED:  NONE  SPECIMEN:  Source of SpecimABSCESS, QUADRICEPS   DISPOSITION OF SPECIMEN:  microbiology   COUNTS:  YES  TOURNIQUET:  * No tourniquets in log *  DICTATION: .Dragon Dictation  indications for procedure: Patient is a 34 year old male with a history of age/HIV with a right lower quadrant abscess seen on CT scan as well as likely myositis. Secondary to this the patient was taken to the operating room for incision and drainage of the abscess.  Details of procedure after the patient was consented he was taken back to the operative room placed in the supine position with bilateral SCDs in place. Patient was then placed under general endotracheal anesthesia. Patient was then prepped and draped in the usual sterile fashion. A timeout was called and all facts were verified.  A longitudinal incision was made in the area of the greatest amount of fluctuance. This was done over previous incision site. The left coronary used to maintain hemostasis dissection was taken down to the fascial level. There was no abscess encountered at this point. The fascia was incised. There was a large amount of edema seen subfascially. There was no amount of necrotic tissue that could be seen. The muscle reacted lightly with electrocautery. Upon opening the fascia of the anterior medial compartment there did appear to be a small amount of purulence just on the  posterior side of the rectus medialis. Cultures were obtained from this area. I further explored distally to this area however there is no further areas of abscess. At this time the area was irrigated out with sterile saline. The fascia of the lateral compartment was reapproximated using 0 Vicryl in a standard running fashion. Half-inch Penrose drain was then placed into the area where the abscess was seen. This is brought out to the skin incision site and anchored to the skin using a 2-0 nylon. The skin was left open and dressed with 4 x 4's. The wound was then wrapped with a Kerlix. The patient tied the procedure well was taken to the recovery room stable condition.   PLAN OF CARE: Admit to inpatient   PATIENT DISPOSITION:  PACU - hemodynamically stable.   Delay start of Pharmacological VTE agent (>24hrs) due to surgical blood loss or risk of bleeding: not applicable

## 2014-12-19 NOTE — Consult Note (Signed)
Renal Service Consult Note Jim Wyatt 12/19/2014 [place] Requesting Physician:  Dr Conley Canal, C.   Reason for Consult:  HIV patient with progressive CKD HPI: The patient is a 34 y.o. year-old with hx of HIV, DVT and renal failure, presented to ED yesterday with R thigh and back pain.  Temp in ED was 102deg, he was admitted and started on IV abx,for abscess, r/o nec fasciitis. Last CD4 count in Jun was 60.  STarted on dapto/ imipenem/ clindamycin. CT showed a focal abscess of the quadriceps. Went to OR - there was no necrotic tissue seen, there was a lot of edema subfascially. There was a small amount of purulence posterior to the rectus medialis, cx's were taken. Further exploration showed no other issues. The area was irrigated and Penrose drain placed. Skin was left open. Creat was 9.56 and we were called to see for progression of renal failure.   Patient is f/b Dr [name] at [facility].  Per EPIC records creat was 6.5 in June and up to 8.5 in August. 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  The family member that is here says she has POA of Jim Wyatt.    [name] denies any problems besides the R thigh pain, no anorexia, n/v/d, no abd pain, no dysgeusia, no insomnia or hypersomnia, no wt loss. Eating good, no fatigue.     Past Medical History  Past Medical History  Diagnosis Date  . Shingles < 2010  . HIV infection ([place]) dx'd 10/2013  . Cellulitis 11/2013    LLE  . CAP (community acquired pneumonia) 10/2013  . DVT (deep venous thrombosis) ([place]) 10/2013    BLE  . Multiple food allergies   . Multiple environmental allergies     "trees, dogs, cats"  . Chronic kidney disease   . 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 Femur fracture surgery Right 09/2001  . Fracture surgery     Family History No family history on file. Social History  reports that he has never smoked. He has never used smokeless tobacco. He reports that he drinks alcohol. He reports that he does not use illicit drugs. Allergies  Allergies  Allergen Reactions  . Bactrim [Sulfamethoxazole-Trimethoprim] Other (See Comments)    Renal failure  . Cauliflower [Brassica Oleracea Italica] Anaphylaxis and Other (See Comments)    Pt states his throat closes up  . Vancomycin Other (See Comments)    Patient states vancomycin caused kidney injury  . Augmentin [Amoxicillin-Pot Clavulanate] Other (See Comments)    unknown  . Banana Other (See Comments)  . Fish Allergy Hives  . Onion Rash   Home medications Prior to Admission medications   Medication Sig Start Date End Date Taking? Authorizing Provider  abacavir (ZIAGEN) 300 MG tablet Take 2 tablets (600 mg total) by mouth daily. 08/26/14  Yes Carlyle Basques, MD  acetaminophen (TYLENOL) 500 MG tablet Take 1,000 mg by mouth every 6 (six) hours as needed for moderate pain, fever or headache.   Yes Historical Provider, MD  albuterol (PROVENTIL HFA;VENTOLIN HFA) 108 (90 BASE) MCG/ACT inhaler Inhale 2 puffs into the lungs every 6 (six) hours as needed for wheezing or shortness of breath. 08/02/14  Yes Costin Karlyne Greenspan, MD  darunavir-cobicistat (PREZCOBIX) 800-150 MG per tablet Take 1 tablet by mouth daily. Swallow whole. Do NOT crush, break or chew tablets. Take with food. 08/26/14  Yes Carlyle Basques, MD  dolutegravir (TIVICAY) 50 MG tablet Take 1 tablet (50 mg total) by mouth daily. 08/26/14  Yes Carlyle Basques, MD  amLODipine (NORVASC) 5 MG tablet Take 1 tablet (5 mg total) by mouth daily. 08/02/14   Costin Karlyne Greenspan, MD  azithromycin (ZITHROMAX) 600 MG tablet Take 2 tablets (1,200 mg total) by mouth once a week. 08/02/14   Costin Karlyne Greenspan,  MD  dapsone 100 MG tablet Take 1 tablet (100 mg total) by mouth daily. 08/02/14   Costin Karlyne Greenspan, MD  ondansetron (ZOFRAN) 4 MG tablet 30 minutes before her meds as needed for nausea 10/12/14   Michel Bickers, MD  sodium bicarbonate 650 MG tablet Take 1 tablet (650 mg total) by mouth 3 (three) times daily. 08/02/14   Caren Griffins, MD   Liver Function Tests  Recent Labs Lab 12/18/14 1741  AST 17  ALT 14*  ALKPHOS 32*  BILITOT 0.8  PROT 8.7*  ALBUMIN 2.6*   No results for input(s): LIPASE, AMYLASE in the last 168 hours. CBC  Recent Labs Lab 12/18/14 1741 12/19/14 0442  WBC 21.3* 23.6*  NEUTROABS 19.1*  --   HGB 8.4* 8.5*  HCT 25.0* 25.4*  MCV 83.9 83.6  PLT 127* 412*   Basic Metabolic Panel  Recent Labs Lab 12/18/14 1741 12/19/14 0442  NA 130* 132*  K 4.6 5.1  CL 107 107  CO2 11* 14*  GLUCOSE 96 94  BUN 61* 62*  CREATININE 9.48* 9.56*  CALCIUM 8.6* 8.4*    Filed Vitals:   12/19/14 0648 12/19/14 1145 12/19/14 1412 12/19/14 1530  BP:  157/88 177/95 152/80  Pulse: 121 134 134 102  Temp:  99.7 F (37.6 C) 102.1 F (38.9 C) 101.4 F (38.6 C)  TempSrc:  Oral Oral Oral  Resp:   16   Height:      Weight:      SpO2:   100%    Exam Alert, WDWN, no distress, calm No rash, cyanosis or gangrene Sclera anicteric, throat clear No jvd Chest is clear bilat, no rales/ wheezing RRR no MRG ABd soft scaphoid ntnd no mass/ ascites/ hsm GU normal male LE's and UE's no edema/ wound/ ulcers Neuro nf, no asterixis, O x3  Na 132 K 5.1  CO2 14  BUN 62   Creat 9.56  Ca 8.4 WBC 23k  Hb 8.5  plt 114 UA 21-50 rbcs/ 0-2 wbcs >300 prot CXR negative]   Assessment: 1 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  He has progression of his CKD. Surprisingly, patient denies having dialysis discussions with his  nephrologist, which I find hard to believe.  He is not overtly uremic and HD is not indicated at this time. The family member present and the patient were discussing whether or not he would agree to do dialysis. This is not a good time for fistula placement with an active infection; regardless the patient said he would refuse any access at this time and wouldn't do anything "until December".  I did not ask a lot of questions about dialysis as the patient was withdrawn and didn't seem to want to talk about  his renal problems at all.  The family member present was a lot more worried than he was about these issues.   2 Met acidosis - already on bicarb tabs 3 R thigh abscess, s/p I&D 4 HIV on meds 5 HTN on norvasc only. No vol excess  Plan- OK with NS at 75 cc/hr.  No other suggestions, will sign off. He can f/u with DR [name] at [facility].   Kelly Splinter MD [employer] pager [phone]    cell [phone] 12/19/2014, 8:07 PM

## 2014-12-19 NOTE — H&P (Signed)
[facility] Admission History and Physical       Jim Wyatt [id number] DOB: [birth year] DOA: 12/18/2014  Referring physician: EDP PCP: [name], MD  Specialists:   Chief Complaint: Fevers and Chills  HPI: Jim Wyatt is a 34 y.o. male with a history of HIV disease who presented to the ED with complaints of 2 days of fever and chills and increased pain in his right Thigh.  A ct scan of the Thigh was performed and revealed a deep abscess in the quadricepts muscle, and General Surgery was consulted and evaluated him and took him to the ED for an I+D.  He was placed on IV antibiotic Therapy of Primaxin, Clindamycin and Daptomycin.   He was seen and evaluated for admission post operatively and is awake and alert and currently hemodynamically stable.  He has some complaints of pain at this time at his surgical site.     Review of Systems:  Constitutional: No Weight Loss, No Weight Gain, Night Sweats, +Fevers, +Chills, Dizziness, Light Headedness, Fatigue, or Generalized Weakness HEENT: No Headaches, Difficulty Swallowing,Tooth/Dental Problems,Sore Throat,  No Sneezing, Rhinitis, Ear Ache, Nasal Congestion, or Post Nasal Drip,  Cardio-vascular:  No Chest pain, Orthopnea, PND, Edema in Lower Extremities, Anasarca, Dizziness, Palpitations  Resp: No Dyspnea, No DOE, No Productive Cough, No Non-Productive Cough, No Hemoptysis, No Wheezing.    GI: No Heartburn, Indigestion, Abdominal Pain, Nausea, Vomiting, Diarrhea, Constipation, Hematemesis, Hematochezia, Melena, Change in Bowel Habits,  Loss of Appetite  GU: No Dysuria, No Change in Color of Urine, No Urgency or Urinary Frequency, No Flank pain.  Musculoskeletal: No Joint Pain or Swelling, No Decreased Range of Motion, No Back Pain.  Neurologic: No Syncope, No Seizures, Muscle Weakness, Paresthesia, Vision Disturbance or Loss, No Diplopia, No Vertigo, No Difficulty Walking,  Skin: No Rash or Lesions. Psych: No  Change in Mood or Affect, No Depression or Anxiety, No Memory loss, No Confusion, or Hallucinations   Past Medical History  Diagnosis Date  . Shingles < 2010  . HIV infection (Jim Wy[place]) dx'd 10/2013  . Cellulitis 11/2013    LLE  . CAP (community acquired pneumonia) 10/2013  . DVT (deep venous thrombosis) ([place]) 10/2013    BLE  . Multiple food allergies   . Multiple environmental allergies     "trees, dogs, cats"  . Chronic kidney disease   . 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 Femur fracture surgery Right 09/2001  . Fracture surgery        Prior to Admission medications   Medication Sig Start Date End Date Taking? Authorizing Deon Duer  abacavir (ZIAGEN) 300 MG tablet Take 2 tablets (600 mg total) by mouth daily. 08/26/14  Yes Carlyle Basques, MD  acetaminophen (TYLENOL) 500 MG tablet Take 1,000 mg by mouth every 6 (six) hours as needed for moderate pain, fever or headache.   Yes Historical Brenisha Tsui, MD  albuterol (PROVENTIL HFA;VENTOLIN HFA) 108 (90 BASE) MCG/ACT inhaler Inhale 2 puffs into the lungs every 6 (six) hours as needed for wheezing or shortness of breath. 08/02/14  Yes Costin Karlyne Greenspan, MD  darunavir-cobicistat (PREZCOBIX) 800-150 MG per tablet Take 1 tablet by mouth daily. Swallow whole. Do NOT crush, break or chew tablets. Take with food. 08/26/14  Yes Carlyle Basques, MD  dolutegravir (TIVICAY) 50 MG tablet Take 1 tablet (50 mg total) by mouth daily. 08/26/14  Yes Carlyle Basques, MD  amLODipine (NORVASC) 5 MG tablet Take 1 tablet (  5 mg total) by mouth daily. 08/02/14   Costin Karlyne Greenspan, MD  azithromycin (ZITHROMAX) 600 MG tablet Take 2 tablets (1,200 mg total) by mouth once a week. 08/02/14   Costin Karlyne Greenspan, MD  dapsone 100 MG tablet Take 1 tablet (100 mg total) by mouth daily. 08/02/14   Costin Karlyne Greenspan, MD  ondansetron (ZOFRAN) 4 MG tablet 30 minutes before her meds as needed for nausea 10/12/14   Michel Bickers, MD  sodium  bicarbonate 650 MG tablet Take 1 tablet (650 mg total) by mouth 3 (three) times daily. 08/02/14   Costin Karlyne Greenspan, MD     Allergies  Allergen Reactions  . Bactrim [Sulfamethoxazole-Trimethoprim] Other (See Comments)    Renal failure  . Cauliflower [Brassica Oleracea Italica] Anaphylaxis and Other (See Comments)    Pt states his throat closes up  . Vancomycin Other (See Comments)    Patient states vancomycin caused kidney injury  . Augmentin [Amoxicillin-Pot Clavulanate] Other (See Comments)    unknown  . Banana Other (See Comments)  . Fish Allergy Hives  . Onion Rash    Social History:  reports that he has never smoked. He has never used smokeless tobacco. He reports that he drinks alcohol. He reports that he does not use illicit drugs.    No family history on file.     Physical Exam:  GEN:  Pleasant Well Nourished and Well Developed  34 y.o. African American  male examined and in no acute distress; cooperative with exam Filed Vitals:   12/18/14 2148 12/19/14 0023 12/19/14 0038 12/19/14 0053  BP:  146/92 148/95 150/87  Pulse:  122 118 119  Temp: 102.6 F (39.2 C) 98.1 F (36.7 C)  99.7 F (37.6 C)  TempSrc: Oral     Resp:  15 16 16   Height:      Weight:      SpO2:  100% 100% 100%   Blood pressure 150/87, pulse 119, temperature 99.7 F (37.6 C), temperature source Oral, resp. rate 16, height 5\' 7"  (1.702 m), weight 86.183 kg (190 lb), SpO2 100 %. PSYCH: He is alert and oriented x4; does not appear anxious does not appear depressed; affect is normal HEENT: Normocephalic and Atraumatic, Mucous membranes pink; PERRLA; EOM intact; Fundi:  Benign;  No scleral icterus, Nares: Patent, Oropharynx: Clear, Fair Dentition,    Neck:  FROM, No Cervical Lymphadenopathy nor Thyromegaly or Carotid Bruit; No JVD; Breasts:: Not examined CHEST WALL: No tenderness CHEST: Normal respiration, clear to auscultation bilaterally HEART: Regular rate and rhythm; no murmurs rubs or gallops BACK:  No kyphosis or scoliosis; No CVA tenderness ABDOMEN: Positive Bowel Sounds, Soft Non-Tender, No Rebound or Guarding; No Masses, No Organomegaly. Rectal Exam: Not done EXTREMITIES: No Cyanosis, Clubbing, or Edema; No Ulcerations.except for Dressing on the lateral Right Quadriceps area with Penrose drain attached Genitalia: not examined PULSES: 2+ and symmetric SKIN: Normal hydration no rash or ulceration CNS:  Alert and Oriented x 4, No Focal Deficits Vascular: pulses palpable throughout    Labs on Admission:  Basic Metabolic Panel:  Recent Labs Lab 12/18/14 1741  NA 130*  K 4.6  CL 107  CO2 11*  GLUCOSE 96  BUN 61*  CREATININE 9.48*  CALCIUM 8.6*   Liver Function Tests:  Recent Labs Lab 12/18/14 1741  AST 17  ALT 14*  ALKPHOS 32*  BILITOT 0.8  PROT 8.7*  ALBUMIN 2.6*   No results for input(s): LIPASE, AMYLASE in the last 168 hours. No results for  input(s): AMMONIA in the last 168 hours. CBC:  Recent Labs Lab 12/18/14 1741  WBC 21.3*  NEUTROABS 19.1*  HGB 8.4*  HCT 25.0*  MCV 83.9  PLT 127*   Cardiac Enzymes:  Recent Labs Lab 12/18/14 1741  CKTOTAL 440*    BNP (last 3 results)  Recent Labs  07/30/14 0350  BNP 28.7    ProBNP (last 3 results) No results for input(s): PROBNP in the last 8760 hours.  CBG: No results for input(s): GLUCAP in the last 168 hours.  Radiological Exams on Admission: Dg Chest 2 View  12/18/2014  CLINICAL DATA:  Shortness breath, fever, tachycardia EXAM: CHEST  2 VIEW COMPARISON:  07/30/2014 FINDINGS: Lungs are clear.  No pleural effusion or pneumothorax. The heart is normal in size. Visualized osseous structures are within normal limits. IMPRESSION: Normal chest radiographs. Electronically Signed   By: Julian Hy M.D.   On: 12/18/2014 18:37   Dg Lumbar Spine Complete  12/18/2014  CLINICAL DATA:  Status post fall, with injury to the lower back. Initial encounter. EXAM: LUMBAR SPINE - COMPLETE 4+ VIEW  COMPARISON:  Lumbar spine radiographs performed 07/30/2014 FINDINGS: There is no evidence of fracture or subluxation. Vertebral bodies demonstrate normal height and alignment. Intervertebral disc spaces are preserved. The visualized neural foramina are grossly unremarkable in appearance. The visualized bowel gas pattern is unremarkable in appearance; air and stool are noted within the colon. The sacroiliac joints are within normal limits. IMPRESSION: No evidence of fracture or subluxation along the lumbar spine. Electronically Signed   By: Garald Balding M.D.   On: 12/18/2014 03:51   Ct Femur Right Wo Contrast  12/18/2014  CLINICAL DATA:  Acute onset right leg pain for 2 days, status post fall. High fever. Initial encounter. EXAM: CT OF THE RIGHT FEMUR WITHOUT CONTRAST TECHNIQUE: Multidetector CT imaging was performed according to the standard protocol. Multiplanar CT image reconstructions were also generated. COMPARISON:  None. FINDINGS: There is marked diffuse enlargement of the right quadriceps musculature. There is a single focus of focal bulging of the superficial musculature, just below the skin surface, measuring approximately 2.8 x 2.3 cm, approximately 14 cm below the level of the right greater femoral trochanter. This most likely reflects a focal abscess. Additional vague decreased attenuation is seen tracking within the deeper quadriceps musculature, with diffuse intramuscular edema extending medially, and underlying pyomyositis cannot be excluded. Diffuse surrounding soft tissue inflammation is seen. It is difficult to determine how much of this is drainable without contrast. Per clinical correlation, there is no evidence for compartment syndrome. The underlying osseous structures appear grossly intact, with mild chronic deformity involving the proximal right femur. An intramedullary rod and screws appear grossly intact, without evidence of loosening. The right femoral head remains seated at the  acetabulum. A trace knee joint effusion is noted, with a few small loose bodies anteriorly. Mild infrapatellar soft tissue swelling is noted. The visualized bowel structures are grossly unremarkable. The bladder is moderately distended and grossly unremarkable in appearance. A small urachal remnant is incidentally noted. Visualized inguinal nodes remain borderline normal in size. The vasculature is not well assessed without contrast. IMPRESSION: 1. Marked diffuse enlargement of the right quadriceps musculature. Single focus of focal bulging of the superficial musculature, just below the skin surface, measuring 2.8 x 2.3 cm, approximately 14 cm below the level of the right greater femoral trochanter at the proximal right thigh. This most likely reflects a focal abscess. Additional vague decreased attenuation within the quadriceps musculature, with  diffuse intramuscular edema extending medially. Underlying pyomyositis cannot be excluded. It is difficult to determine how much of this is drainable without contrast. 2. Diffuse surrounding soft tissue inflammation noted about the anterior compartment. No evidence for compartment syndrome, per clinical correlation. 3. No evidence for osteomyelitis. 4. Trace right knee joint effusion, with a few small anterior loose bodies and mild infrapatellar soft tissue swelling. These results were called by telephone at the time of interpretation on 12/18/2014 at 10:15 pm to Dr. Eulis Foster, who verbally acknowledged these results. Electronically Signed   By: Garald Balding M.D.   On: 12/18/2014 22:20   Dg Femur, Min 2 Views Right  12/18/2014  CLINICAL DATA:  Status post fall, with injury to the right thigh. Initial encounter. EXAM: RIGHT FEMUR 2 VIEWS COMPARISON:  None. FINDINGS: There is no evidence of acute fracture or dislocation. The patient's right femoral intramedullary rod and screws appear intact, with associated mild chronic deformity. The right femoral head remains seated at  the acetabulum. The knee joint is grossly unremarkable in appearance. No significant joint effusion is identified. A small focus of calcification overlying the anterior distal thigh may reflect a small loose body or vascular calcification. No significant soft tissue abnormalities are characterized on radiograph. IMPRESSION: No evidence of acute fracture or dislocation. Visualized hardware appears grossly intact. Electronically Signed   By: Garald Balding M.D.   On: 12/18/2014 03:52        Assessment/Plan:   34 y.o. male with  Active Problems:    1.     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 Rosendo Gros   IV antibiotics Primaxin, Daptomycin, and Clindamycin   Consult  Infectious Diseases in AM     2.     HIV disease     On Abacvir, Tivacay, and Prezcobix    Prezcobix held due to Sulfa Allergy   Consult ID in AM   Check CD4 Count      3.     Leukocytosis- due to #1   Monitor Trend      4.    Anemia- due to Renal Disease   Monitor Hb level      5.    Thrombocytopenia   Monitor PLTs      6.    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     Stage IV CKD   Resume Bicarb Rx   Consult Renal in AM   Monitor BUN/Cr        9.     DVT Prophylaxis   SCDs    Change to Lovenox 24 hours after surgery     Code Status:     FULL CODE        Family Communication:  No Family Present    Disposition Plan:    Inpatient  Status with Anticipated stay of 2-3 days then discharge to Home     Time spent:  [facility] Pager [phone]   If [place] Please Contact the Day Rounding Team MD for [facility]  If 7PM-7AM, Please Contact Night-Floor Coverage  [email or web address] Password [email or web address] 12/19/2014, 1:03 AM     ADDENDUM:   Patient  was seen and  examined on 12/19/2014

## 2014-12-20 ENCOUNTER — Encounter (HOSPITAL_COMMUNITY): Payer: Self-pay | Admitting: Student

## 2014-12-20 DIAGNOSIS — R110 Nausea: Secondary | ICD-10-CM | POA: Diagnosis not present

## 2014-12-20 DIAGNOSIS — B950 Streptococcus, group A, as the cause of diseases classified elsewhere: Secondary | ICD-10-CM

## 2014-12-20 LAB — RENAL FUNCTION PANEL
Albumin: 2 g/dL — ABNORMAL LOW (ref 3.5–5.0)
Anion gap: 6 (ref 5–15)
BUN: 66 mg/dL — ABNORMAL HIGH (ref 6–20)
CO2: 16 mmol/L — ABNORMAL LOW (ref 22–32)
Calcium: 8.5 mg/dL — ABNORMAL LOW (ref 8.9–10.3)
Chloride: 107 mmol/L (ref 101–111)
Creatinine, Ser: 9.69 mg/dL — ABNORMAL HIGH (ref 0.61–1.24)
GFR calc Af Amer: 7 mL/min — ABNORMAL LOW (ref >60)
GFR calc non Af Amer: 6 mL/min — ABNORMAL LOW (ref >60)
Glucose, Bld: 96 mg/dL (ref 65–99)
Phosphorus: 4.4 mg/dL (ref 2.5–4.6)
Potassium: 4.4 mmol/L (ref 3.5–5.1)
Sodium: 129 mmol/L — ABNORMAL LOW (ref 135–145)

## 2014-12-20 LAB — CBC
HCT: 24.1 % — ABNORMAL LOW (ref 39.0–52.0)
Hemoglobin: 8 g/dL — ABNORMAL LOW (ref 13.0–17.0)
MCH: 27.6 pg (ref 26.0–34.0)
MCHC: 33.2 g/dL (ref 30.0–36.0)
MCV: 83.1 fL (ref 78.0–100.0)
Platelets: 122 10*3/uL — ABNORMAL LOW (ref 150–400)
RBC: 2.9 MIL/uL — ABNORMAL LOW (ref 4.22–5.81)
RDW: 14.2 % (ref 11.5–15.5)
WBC: 22.4 10*3/uL — ABNORMAL HIGH (ref 4.0–10.5)

## 2014-12-20 LAB — URINE CULTURE: Culture: 9000

## 2014-12-20 MED ORDER — CEFAZOLIN SODIUM 1-5 GM-% IV SOLN
1.0000 g | Freq: Two times a day (BID) | INTRAVENOUS | Status: DC
  Administered 2014-12-20 – 2014-12-22 (×4): 1 g via INTRAVENOUS
  Filled 2014-12-20 (×7): qty 50

## 2014-12-20 MED ORDER — DARBEPOETIN ALFA 40 MCG/0.4ML IJ SOSY
40.0000 ug | PREFILLED_SYRINGE | INTRAMUSCULAR | Status: DC
  Administered 2014-12-20: 40 ug via SUBCUTANEOUS
  Filled 2014-12-20: qty 0.4

## 2014-12-20 NOTE — Progress Notes (Signed)
Pt temp elevated 102.2 orally.  2 Tylenol given, blood cultures done 10/21.

## 2014-12-20 NOTE — Evaluation (Signed)
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  Patient will benefit from skilled PT to increase their independence and safety with mobility to allow discharge to the venue listed below.       Follow Up Recommendations Outpatient PT;Supervision - Intermittent    Equipment Recommendations  Rolling walker with 5" wheels    Recommendations for Other Services       Precautions / Restrictions Precautions Precautions: None Restrictions Weight Bearing Restrictions: No      Mobility  Bed Mobility                  Transfers Overall transfer level: Needs assistance Equipment used: Rolling walker (2 wheeled) Transfers: Sit to/from Stand Sit to Stand: Supervision         General transfer comment: Overall good rise from commode using grab bars, incr time due to pain; cues for positioning  Ambulation/Gait Ambulation/Gait assistance: Min guard;Supervision Ambulation Distance (Feet): 80 Feet Assistive device: Rolling walker (2 wheeled) Gait Pattern/deviations: Step-to pattern;Antalgic Gait velocity: slow Gait velocity interpretation: Below normal speed for age/gender General Gait Details: minguard progressing to supervision; taking minimal weight RLE, but able to get his heel down; heavy reliance on RW  Stairs            Wheelchair Mobility    Modified Rankin (Stroke Patients Only)       Balance                                             Pertinent Vitals/Pain Pain Assessment: 0-10 Pain Score: 10-Worst pain ever Pain Location: R  anterior thigh; says it's a 10/10 pain; says it's not as bad as the DVT pain he has experienced Pain Descriptors / Indicators: Aching;Guarding;Sore Pain Intervention(s): Limited activity within patient's tolerance;Monitored during session;Premedicated before session;Repositioned    Home Living Family/patient expects to be discharged to:: Private residence Living Arrangements: Non-relatives/Friends Available Help at Discharge: Friend(s);Available PRN/intermittently Type of Home: House Home Access: Stairs to enter Entrance Stairs-Rails: None Entrance Stairs-Number of Steps: 1 Home Layout: One level Home Equipment: Cane - single point;Crutches Additional Comments: he may not be able to find his crutches    Prior Function Level of Independence: Independent with assistive device(s)         Comments: Has had previous admissions with femur fracture and DVTs     Hand Dominance        Extremity/Trunk Assessment   Upper Extremity Assessment: Overall WFL for tasks assessed           Lower Extremity Assessment: RLE deficits/detail RLE Deficits / Details: Grossly decr AROM and strenght, limited by pain; significant swelling R thigh    Cervical / Trunk Assessment: Normal  Communication   Communication: No difficulties  Cognition Arousal/Alertness: Awake/alert Behavior During Therapy: WFL for tasks assessed/performed Overall Cognitive Status: Within Functional Limits for tasks assessed                      General Comments General comments (skin integrity,  edema, etc.): Decr tolerance of knee bending -- does not want to stretch quad at all due to anticipation of pain    Exercises        Assessment/Plan    PT Assessment Patient needs continued PT services  PT Diagnosis Difficulty walking;Acute pain   PT Problem List Decreased strength;Decreased range of motion;Decreased activity tolerance;Decreased balance;Decreased mobility;Decreased knowledge of use of  DME;Pain;Decreased knowledge of precautions  PT Treatment Interventions DME instruction;Gait training;Stair training;Functional mobility training;Therapeutic activities;Therapeutic exercise;Patient/family education   PT Goals (Current goals can be found in the Care Plan section) Acute Rehab PT Goals Patient Stated Goal: Hopes he can graduate in December PT Goal Formulation: With patient Time For Goal Achievement: 12/27/14 Potential to Achieve Goals: Good    Frequency Min 5X/week   Barriers to discharge        Co-evaluation               End of Session   Activity Tolerance: Patient tolerated treatment well Patient left: in chair;with call bell/phone within reach Nurse Communication: Mobility status         Time: [id number] PT Time Calculation (min) (ACUTE ONLY): 34 min   Charges:   PT Evaluation $Initial PT Evaluation Tier I: 1 Procedure PT Treatments $Gait Training: 8-22 mins   PT G Codes:        Quin Hoop 12/20/2014, 5:35 PM  Roney [place], [place] Pager [phone] Office [phone]

## 2014-12-20 NOTE — Progress Notes (Signed)
Utilization review completed.  

## 2014-12-20 NOTE — Progress Notes (Signed)
MEDICATION RELATED CONSULT NOTE - INITIAL   Pharmacy Consult for anemia Indication: anemia associated with chronic renal failure   Patient Measurements: Height: 5\' 7"  (170.2 cm) Weight: 192 lb 10.9 oz (87.4 kg) IBW/kg (Calculated) : 66.1  Vital Signs: Temp: 102.2 F (39 C) (10/23 1130) Temp Source: Oral (10/23 1130) BP: 144/75 mmHg (10/23 1130) Pulse Rate: 131 (10/23 1130) Intake/Output from previous day: 10/22 0701 - 10/23 0700 In: 1280 [P.O.:480; I.V.:600; IV Piggyback:200] Out: 1825 [Urine:1825] Intake/Output from this shift: Total I/O In: 200 [P.O.:200] Out: 500 [Urine:500]  Labs:  Recent Labs  12/18/14 1741 12/19/14 0442 12/20/14 0350  WBC 21.3* 23.6* 22.4*  HGB 8.4* 8.5* 8.0*  HCT 25.0* 25.4* 24.1*  PLT 127* 114* 122*  CREATININE 9.48* 9.56* 9.69*  PHOS  --   --  4.4  ALBUMIN 2.6*  --  2.0*  PROT 8.7*  --   --   AST 17  --   --   ALT 14*  --   --   ALKPHOS 32*  --   --   BILITOT 0.8  --   --    Estimated Creatinine Clearance: 11.7 mL/min (by C-G formula based on Cr of 9.69).   Microbiology: Recent Results (from the past 720 hour(s))  Culture, blood (routine x 2)     Status: None (Preliminary result)   Collection Time: 12/18/14  5:41 PM  Result Value Ref Range Status   Specimen Description BLOOD RIGHT HAND  Final   Special Requests BOTTLES DRAWN AEROBIC AND ANAEROBIC 5CC  Final   Culture NO GROWTH 2 DAYS  Final   Report Status PENDING  Incomplete  Culture, blood (routine x 2)     Status: None (Preliminary result)   Collection Time: 12/18/14  6:00 PM  Result Value Ref Range Status   Specimen Description BLOOD RIGHT ARM  Final   Special Requests BOTTLES DRAWN AEROBIC AND ANAEROBIC 5CC  Final   Culture NO GROWTH 2 DAYS  Final   Report Status PENDING  Incomplete  Urine culture     Status: None   Collection Time: 12/18/14  7:10 PM  Result Value Ref Range Status   Specimen Description URINE, RANDOM  Final   Special Requests NONE  Final   Culture  9,000 COLONIES/mL INSIGNIFICANT GROWTH  Final   Report Status 12/20/2014 FINAL  Final  Anaerobic culture     Status: None (Preliminary result)   Collection Time: 12/18/14 11:55 PM  Result Value Ref Range Status   Specimen Description WOUND RIGHT THIGH  Final   Special Requests PT ON DAPTOMYOIN,CLINDAMYCIN AND PRIMAXIN  Final   Gram Stain PENDING  Incomplete   Culture   Final    NO ANAEROBES ISOLATED; CULTURE IN PROGRESS FOR 5 DAYS Performed at [facility]    Report Status PENDING  Incomplete  Gram stain     Status: None   Collection Time: 12/18/14 11:55 PM  Result Value Ref Range Status   Specimen Description WOUND  Final   Special Requests NONE  Final   Gram Stain   Final    MODERATE WBC PRESENT,BOTH PMN AND MONONUCLEAR FEW GRAM POSITIVE COCCI IN PAIRS IN CHAINS Gram Stain Report Called to,Read Back By and Verified With: LOURDIS @0139  12/19/14 MKELLY    Report Status 12/19/2014 FINAL  Final  Culture, routine-abscess     Status: None (Preliminary result)   Collection Time: 12/18/14 11:55 PM  Result Value Ref Range Status   Specimen Description WOUND RIGHT THIGH  Final   Special Requests PT ON DAPTOMYCIN,CLINDAMYCIN AND PRIMAXIN  Final   Gram Stain PENDING  Incomplete   Culture   Final    MODERATE GROUP A STREP (S.PYOGENES) ISOLATED Performed at [facility]    Report Status PENDING  Incomplete     Medications:  Scheduled:  . abacavir  600 mg Oral Daily  . amLODipine  5 mg Oral Daily  . azithromycin  1,200 mg Oral Weekly  . clindamycin (CLEOCIN) IV  900 mg Intravenous 3 times per day  . dapsone  100 mg Oral Daily  . DAPTOmycin (CUBICIN)  IV  500 mg Intravenous Q48H  . darbepoetin (ARANESP) injection - NON-DIALYSIS  40 mcg Subcutaneous Q Sun-1800  . darunavir-cobicistat  1 tablet Oral Daily  . dolutegravir  50 mg Oral Daily  . sodium bicarbonate  650 mg Oral TID    Assessment: 34 year old man known to pharmacy from daptomycin dosing to start on  Aranesp for anemia associated with chronic renal failure.  Plan:  Aranesp 40 mcg SQ weekly on Sunday. Anemia panel ordered for tomorrow by MD.  Group A strep growing in abscess culture.  Discussed with Dr. Tommy Medal - will stop daptomycin and change to cefazolin 1g IV q12h - continue clindamycin for now.  Candie Mile 12/20/2014,12:45 PM

## 2014-12-20 NOTE — Progress Notes (Signed)
2 Days Post-Op I&D R thigh fluid collection Subjective: complains of pain in his R thigh  Objective: Vital signs in last 24 hours: Temp:  [99.7 F (37.6 C)-102.1 F (38.9 C)] 100.6 F (38.1 C) (10/23 0606) Pulse Rate:  [102-138] 133 (10/23 0606) Resp:  [16-18] 18 (10/23 0606) BP: (135-177)/(67-95) 135/68 mmHg (10/23 0606) SpO2:  [95 %-100 %] 95 % (10/23 0606)   Intake/Output from previous day: 10/22 0701 - 10/23 0700 In: 1280 [P.O.:480; I.V.:600; IV Piggyback:200] Out: 1825 [Urine:1825] Intake/Output this shift:     General appearance: alert and cooperative Extremities: no edema  Incision: no significant drainage  Lab Results:   Recent Labs  12/19/14 0442 12/20/14 0350  WBC 23.6* 22.4*  HGB 8.5* 8.0*  HCT 25.4* 24.1*  PLT 114* 122*   BMET  Recent Labs  12/19/14 0442 12/20/14 0350  NA 132* 129*  K 5.1 4.4  CL 107 107  CO2 14* 16*  GLUCOSE 94 96  BUN 62* 66*  CREATININE 9.56* 9.69*  CALCIUM 8.4* 8.5*   PT/INR No results for input(s): LABPROT, INR in the last 72 hours. ABG  Recent Labs  12/19/14 0342  PHART 7.306*  HCO3 12.8*    MEDS, Scheduled . abacavir  600 mg Oral Daily  . amLODipine  5 mg Oral Daily  . azithromycin  1,200 mg Oral Weekly  . clindamycin (CLEOCIN) IV  900 mg Intravenous 3 times per day  . dapsone  100 mg Oral Daily  . DAPTOmycin (CUBICIN)  IV  500 mg Intravenous Q48H  . darunavir-cobicistat  1 tablet Oral Daily  . dolutegravir  50 mg Oral Daily  . sodium bicarbonate  650 mg Oral TID    Studies/Results: Dg Chest 2 View  12/18/2014  CLINICAL DATA:  Shortness breath, fever, tachycardia EXAM: CHEST  2 VIEW COMPARISON:  07/30/2014 FINDINGS: Lungs are clear.  No pleural effusion or pneumothorax. The heart is normal in size. Visualized osseous structures are within normal limits. IMPRESSION: Normal chest radiographs. Electronically Signed   By: Julian Hy M.D.   On: 12/18/2014 18:37   Ct Femur Right Wo  Contrast  12/18/2014  CLINICAL DATA:  Acute onset right leg pain for 2 days, status post fall. High fever. Initial encounter. EXAM: CT OF THE RIGHT FEMUR WITHOUT CONTRAST TECHNIQUE: Multidetector CT imaging was performed according to the standard protocol. Multiplanar CT image reconstructions were also generated. COMPARISON:  None. FINDINGS: There is marked diffuse enlargement of the right quadriceps musculature. There is a single focus of focal bulging of the superficial musculature, just below the skin surface, measuring approximately 2.8 x 2.3 cm, approximately 14 cm below the level of the right greater femoral trochanter. This most likely reflects a focal abscess. Additional vague decreased attenuation is seen tracking within the deeper quadriceps musculature, with diffuse intramuscular edema extending medially, and underlying pyomyositis cannot be excluded. Diffuse surrounding soft tissue inflammation is seen. It is difficult to determine how much of this is drainable without contrast. Per clinical correlation, there is no evidence for compartment syndrome. The underlying osseous structures appear grossly intact, with mild chronic deformity involving the proximal right femur. An intramedullary rod and screws appear grossly intact, without evidence of loosening. The right femoral head remains seated at the acetabulum. A trace knee joint effusion is noted, with a few small loose bodies anteriorly. Mild infrapatellar soft tissue swelling is noted. The visualized bowel structures are grossly unremarkable. The bladder is moderately distended and grossly unremarkable in appearance. A small urachal  remnant is incidentally noted. Visualized inguinal nodes remain borderline normal in size. The vasculature is not well assessed without contrast. IMPRESSION: 1. Marked diffuse enlargement of the right quadriceps musculature. Single focus of focal bulging of the superficial musculature, just below the skin surface,  measuring 2.8 x 2.3 cm, approximately 14 cm below the level of the right greater femoral trochanter at the proximal right thigh. This most likely reflects a focal abscess. Additional vague decreased attenuation within the quadriceps musculature, with diffuse intramuscular edema extending medially. Underlying pyomyositis cannot be excluded. It is difficult to determine how much of this is drainable without contrast. 2. Diffuse surrounding soft tissue inflammation noted about the anterior compartment. No evidence for compartment syndrome, per clinical correlation. 3. No evidence for osteomyelitis. 4. Trace right knee joint effusion, with a few small anterior loose bodies and mild infrapatellar soft tissue swelling. These results were called by telephone at the time of interpretation on 12/18/2014 at 10:15 pm to Dr. Eulis Foster, who verbally acknowledged these results. Electronically Signed   By: Garald Balding M.D.   On: 12/18/2014 22:20    Assessment: s/p Procedure(s): IRRIGATION AND DEBRIDEMENT RIGHT LOWER EXTREMITY Patient Active Problem List   Diagnosis Date Noted  . Abscess of right thigh 12/19/2014  . HIV infection ([place]) 12/19/2014  . Chronic kidney disease (CKD), stage IV (severe) ([place]) 12/19/2014  . Leukocytosis 12/19/2014  . 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 Anemia due to end stage renal disease ([place]) 12/19/2014  . Hyponatremia 12/19/2014  . Necrotizing fasciitis ([place])   . AIDS ([place])   . Pyomyositis 12/18/2014  . Community acquired pneumonia 07/30/2014  . Sepsis ([place]) 07/30/2014  . Metabolic acidosis [id number]  . Anemia of chronic disease 07/30/2014  . Thrombocytopenia ([place]) 07/30/2014  . Acute on chronic renal failure ([place])   . 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 Human immunodeficiency virus (HIV) disease ([place]) 12/11/2013  . Hospital discharge follow-up 12/11/2013  . CMV (cytomegalovirus infection) ([place]) 12/07/2013  . Protein-calorie malnutrition, severe ([place]) 12/06/2013   . 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 DVT (deep venous thrombosis) ([place]) 11/24/2013  . Asthma, chronic 11/24/2013  . Acquired immune deficiency syndrome ([place]) 11/14/2013  . AKI (acute kidney injury) ([place]) 11/11/2013  . Elevated liver enzymes 11/09/2013  . History of shingles 11/09/2013  . Obesity 11/09/2013  . Pneumonia   . Asthma     S/p I&D R thigh abscess  Plan: Gram stain shows GPC, cultures pending  Change dressing daily starting today.  [place] drain left in wound to encourage drainage Abx per ID   LOS: 2 days     .Rosario Adie, MD [facility], [place]   12/20/2014 10:28 AM

## 2014-12-20 NOTE — Progress Notes (Signed)
[place]    Subjective: Patient was being visited by family so I remove myself in the room since he do not peter comfortable wanting to talk to me about his varus conditions of family present.   Antibiotics:  Anti-infectives    Start     Dose/Rate Route Frequency Ordered Stop   12/20/14 2200  DAPTOmycin (CUBICIN) 500 mg in sodium chloride 0.9 % IVPB  Status:  Discontinued     500 mg 220 mL/hr over 30 Minutes Intravenous Every 48 hours 12/19/14 1017 12/20/14 1318   12/20/14 2200  ceFAZolin (ANCEF) IVPB 1 g/50 mL premix     1 g 100 mL/hr over 30 Minutes Intravenous Every 12 hours 12/20/14 1318     12/20/14 1000  azithromycin (ZITHROMAX) tablet 1,200 mg     1,200 mg Oral Weekly 12/19/14 1031     12/19/14 1530  clindamycin (CLEOCIN) IVPB 900 mg     900 mg 100 mL/hr over 30 Minutes Intravenous 3 times per day 12/19/14 1438     12/19/14 1045  dapsone tablet 100 mg     100 mg Oral Daily 12/19/14 1031     12/19/14 1045  darunavir-cobicistat (PREZCOBIX) 800-150 MG per tablet 1 tablet  Status:  Discontinued     1 tablet Oral Daily 12/19/14 1031 12/19/14 1033   12/19/14 1000  abacavir (ZIAGEN) tablet 600 mg     600 mg Oral Daily 12/19/14 0115     12/19/14 1000  dolutegravir (TIVICAY) tablet 50 mg     50 mg Oral Daily 12/19/14 0115     12/19/14 1000  darunavir-cobicistat (PREZCOBIX) 800-150 MG per tablet 1 tablet     1 tablet Oral Daily 12/19/14 0357     12/18/14 2130  DAPTOmycin (CUBICIN) 345 mg in sodium chloride 0.9 % IVPB     4 mg/kg  86.2 kg 213.8 mL/hr over 30 Minutes Intravenous  Once 12/18/14 2056 12/18/14 2318   12/18/14 2100  imipenem-cilastatin (PRIMAXIN) 250 mg in sodium chloride 0.9 % 100 mL IVPB  Status:  Discontinued     250 mg 200 mL/hr over 30 Minutes Intravenous Every 12 hours 12/18/14 2037 12/19/14 1438   12/18/14 2030  clindamycin (CLEOCIN) IVPB 900 mg     900 mg 100 mL/hr over 30 Minutes Intravenous  Once 12/18/14  2028 12/18/14 2148      Medications: Scheduled Meds: . abacavir  600 mg Oral Daily  . amLODipine  5 mg Oral Daily  . azithromycin  1,200 mg Oral Weekly  .  ceFAZolin (ANCEF) IV  1 g Intravenous Q12H  . clindamycin (CLEOCIN) IV  900 mg Intravenous 3 times per day  . dapsone  100 mg Oral Daily  . darbepoetin (ARANESP) injection - NON-DIALYSIS  40 mcg Subcutaneous Q Sun-1800  . darunavir-cobicistat  1 tablet Oral Daily  . dolutegravir  50 mg Oral Daily  . sodium bicarbonate  650 mg Oral TID   Continuous Infusions:  PRN Meds:.acetaminophen **OR** acetaminophen, alum & mag hydroxide-simeth, diphenhydrAMINE, hydrALAZINE, HYDROmorphone (DILAUDID) injection, ondansetron **OR** ondansetron (ZOFRAN) IV, oxyCODONE-acetaminophen **AND** oxyCODONE, oxyCODONE    Objective: Weight change:   Intake/Output Summary (Last 24 hours) at 12/20/14 1612 Last data filed at 12/20/14 1439  Gross per 24 hour  Intake    440 ml  Output   1375 ml  Net   -935 ml   Blood pressure 110/54, pulse 131, temperature 99.6 F (37.6 C),  temperature source Oral, resp. rate 18, height 5\' 7"  (1.702 m), weight 192 lb 10.9 oz (87.4 kg), SpO2 97 %. Temp:  [99.6 F (37.6 C)-102.2 F (39 C)] 99.6 F (37.6 C) (10/23 1422) Pulse Rate:  [131-138] 131 (10/23 1422) Resp:  [16-18] 18 (10/23 1422) BP: (110-165)/(54-95) 110/54 mmHg (10/23 1422) SpO2:  [95 %-99 %] 97 % (10/23 1422)  Physical Exam: General: Alert and awake, oriented x3, n See above  CBC:  CBC Latest Ref Rng 12/20/2014 12/19/2014 12/18/2014  WBC 4.0 - 10.5 K/uL 22.4(H) 23.6(H) 21.3(H)  Hemoglobin 13.0 - 17.0 g/dL 8.0(L) 8.5(L) 8.4(L)  Hematocrit 39.0 - 52.0 % 24.1(L) 25.4(L) 25.0(L)  Platelets 150 - 400 K/uL 122(L) 114(L) 127(L)   CBC Latest Ref Rng 12/20/2014 12/19/2014 12/18/2014  WBC 4.0 - 10.5 K/uL 22.4(H) 23.6(H) 21.3(H)  Hemoglobin 13.0 - 17.0 g/dL 8.0(L) 8.5(L) 8.4(L)  Hematocrit 39.0 - 52.0 % 24.1(L) 25.4(L) 25.0(L)  Platelets 150 - 400 K/uL  122(L) 114(L) 127(L)       BMET  Recent Labs  12/19/14 0442 12/20/14 0350  NA 132* 129*  K 5.1 4.4  CL 107 107  CO2 14* 16*  GLUCOSE 94 96  BUN 62* 66*  CREATININE 9.56* 9.69*  CALCIUM 8.4* 8.5*     Liver Panel   Recent Labs  12/18/14 1741 12/20/14 0350  PROT 8.7*  --   ALBUMIN 2.6* 2.0*  AST 17  --   ALT 14*  --   ALKPHOS 32*  --   BILITOT 0.8  --        Sedimentation Rate  Recent Labs  12/18/14 2048  ESRSEDRATE 122*   C-Reactive Protein No results for input(s): CRP in the last 72 hours.  Micro Results: Recent Results (from the past 720 hour(s))  Culture, blood (routine x 2)     Status: None (Preliminary result)   Collection Time: 12/18/14  5:41 PM  Result Value Ref Range Status   Specimen Description BLOOD RIGHT HAND  Final   Special Requests BOTTLES DRAWN AEROBIC AND ANAEROBIC 5CC  Final   Culture NO GROWTH 2 DAYS  Final   Report Status PENDING  Incomplete  Culture, blood (routine x 2)     Status: None (Preliminary result)   Collection Time: 12/18/14  6:00 PM  Result Value Ref Range Status   Specimen Description BLOOD RIGHT ARM  Final   Special Requests BOTTLES DRAWN AEROBIC AND ANAEROBIC 5CC  Final   Culture NO GROWTH 2 DAYS  Final   Report Status PENDING  Incomplete  Urine culture     Status: None   Collection Time: 12/18/14  7:10 PM  Result Value Ref Range Status   Specimen Description URINE, RANDOM  Final   Special Requests NONE  Final   Culture 9,000 COLONIES/mL INSIGNIFICANT GROWTH  Final   Report Status 12/20/2014 FINAL  Final  Anaerobic culture     Status: None (Preliminary result)   Collection Time: 12/18/14 11:55 PM  Result Value Ref Range Status   Specimen Description WOUND RIGHT THIGH  Final   Special Requests PT ON DAPTOMYOIN,CLINDAMYCIN AND PRIMAXIN  Final   Gram Stain PENDING  Incomplete   Culture   Final    NO ANAEROBES ISOLATED; CULTURE IN PROGRESS FOR 5 DAYS Performed at [facility]    Report Status  PENDING  Incomplete  Gram stain     Status: None   Collection Time: 12/18/14 11:55 PM  Result Value Ref Range Status   Specimen Description WOUND  Final   Special Requests NONE  Final   Gram Stain   Final    MODERATE WBC PRESENT,BOTH PMN AND MONONUCLEAR FEW GRAM POSITIVE COCCI IN PAIRS IN CHAINS Gram Stain Report Called to,Read Back By and Verified With: LOURDIS @0139  12/19/14 MKELLY    Report Status 12/19/2014 FINAL  Final  Culture, routine-abscess     Status: None (Preliminary result)   Collection Time: 12/18/14 11:55 PM  Result Value Ref Range Status   Specimen Description WOUND RIGHT THIGH  Final   Special Requests PT ON DAPTOMYCIN,CLINDAMYCIN AND PRIMAXIN  Final   Gram Stain PENDING  Incomplete   Culture   Final    MODERATE GROUP A STREP (S.PYOGENES) ISOLATED Performed at [facility]    Report Status PENDING  Incomplete    Studies/Results: Dg Chest 2 View  12/18/2014  CLINICAL DATA:  Shortness breath, fever, tachycardia EXAM: CHEST  2 VIEW COMPARISON:  07/30/2014 FINDINGS: Lungs are clear.  No pleural effusion or pneumothorax. The heart is normal in size. Visualized osseous structures are within normal limits. IMPRESSION: Normal chest radiographs. Electronically Signed   By: Julian Hy M.D.   On: 12/18/2014 18:37   Ct Femur Right Wo Contrast  12/18/2014  CLINICAL DATA:  Acute onset right leg pain for 2 days, status post fall. High fever. Initial encounter. EXAM: CT OF THE RIGHT FEMUR WITHOUT CONTRAST TECHNIQUE: Multidetector CT imaging was performed according to the standard protocol. Multiplanar CT image reconstructions were also generated. COMPARISON:  None. FINDINGS: There is marked diffuse enlargement of the right quadriceps musculature. There is a single focus of focal bulging of the superficial musculature, just below the skin surface, measuring approximately 2.8 x 2.3 cm, approximately 14 cm below the level of the right greater femoral trochanter. This  most likely reflects a focal abscess. Additional vague decreased attenuation is seen tracking within the deeper quadriceps musculature, with diffuse intramuscular edema extending medially, and underlying pyomyositis cannot be excluded. Diffuse surrounding soft tissue inflammation is seen. It is difficult to determine how much of this is drainable without contrast. Per clinical correlation, there is no evidence for compartment syndrome. The underlying osseous structures appear grossly intact, with mild chronic deformity involving the proximal right femur. An intramedullary rod and screws appear grossly intact, without evidence of loosening. The right femoral head remains seated at the acetabulum. A trace knee joint effusion is noted, with a few small loose bodies anteriorly. Mild infrapatellar soft tissue swelling is noted. The visualized bowel structures are grossly unremarkable. The bladder is moderately distended and grossly unremarkable in appearance. A small urachal remnant is incidentally noted. Visualized inguinal nodes remain borderline normal in size. The vasculature is not well assessed without contrast. IMPRESSION: 1. Marked diffuse enlargement of the right quadriceps musculature. Single focus of focal bulging of the superficial musculature, just below the skin surface, measuring 2.8 x 2.3 cm, approximately 14 cm below the level of the right greater femoral trochanter at the proximal right thigh. This most likely reflects a focal abscess. Additional vague decreased attenuation within the quadriceps musculature, with diffuse intramuscular edema extending medially. Underlying pyomyositis cannot be excluded. It is difficult to determine how much of this is drainable without contrast. 2. Diffuse surrounding soft tissue inflammation noted about the anterior compartment. No evidence for compartment syndrome, per clinical correlation. 3. No evidence for osteomyelitis. 4. Trace right knee joint effusion, with a  few small anterior loose bodies and mild infrapatellar soft tissue swelling. These results were called by telephone at  the time of interpretation on 12/18/2014 at 10:15 pm to Dr. Eulis Foster, who verbally acknowledged these results. Electronically Signed   By: Garald Balding M.D.   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 male  with fulminant onset of abscess in muscle with myositis cultures growing group A strep He also has HIV and AIDS that has been fairly poorly controlled though he endorses being compliant with therapy for the last 2 months.  He also has chronic kidney disease and is in need of hemodialysis. He seems to think that he will get a donor kidney have told him he will likely need dialysis at least as a temporary measure and certainly would not qualify for renal transplantation as of 8 if his HIV remains uncontrolled. 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  #1 Severe infection with abscess myositis due to GAS:  Narrow to ancef and clindamycin  Greatly appreciate general surgery's critical help here.   #2 HIV and AIDS: Continue his current salvage regimen of TivicayPREZCOBIX and abacavir, dapsone for PCP  #3 CKD: There is a disconnect in my mind and his understanding of his disease. Dr Jonnie Finner saw as well greatly appreciate his help as well.   LOS: 2 days   Alcide Evener 12/20/2014, 4:12 PM

## 2014-12-20 NOTE — Progress Notes (Addendum)
[facility] PROGRESS NOTE  Jim Wyatt [name] DOB: 08/31/1980 DOA: 12/18/2014 PCP: [name], MD  Summary 34 y.o. 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 A ct scan of the Thigh was performed and revealed a deep abscess in the quadricepts muscle, and General Surgery was consulted and evaluated him and took him to the ED for an I+D.    Assessment/Plan:  Principal Problem:   Abscess of right thigh s/p I and D. cx growing GAS. Now on ancef and clinda per ID Active Problems:   FSGS (focal segmental glomerulosclerosis) with progressive CKD stage 5 now. Nephrology consulted and signed off. Nauseated postoperatively, but no uremic signs PTA. F/u Dr. [name] after discharge.   Sepsis ([place])   Thrombocytopenia ([place])   Renovascular hypertension   Anemia due to end stage renal disease [facility]): will check anemia panel and aranesp per pharmacy.   Hyponatremia   AIDS ([place]) last CD4 60, quant 1419 in June   Nausea without vomiting: antiemetics   Code Status:  full Family Communication:  Friend at bedside Disposition Plan:    Consultants:  Surgery  ID  nephrology  Procedures:   i and d right thigh abscess  Antibiotics:  Clinda, ancef  HPI/Subjective: C/o nausea. pain  Objective: Filed Vitals:   12/20/14 1422  BP: 110/54  Pulse: 131  Temp: 99.6 F (37.6 C)  Resp: 18    Intake/Output Summary (Last 24 hours) at 12/20/14 1519 Last data filed at 12/20/14 1439  Gross per 24 hour  Intake    440 ml  Output   1375 ml  Net   -935 ml   Filed Weights   12/18/14 1729 12/19/14 0119  Weight: 86.183 kg (190 lb) 87.4 kg (192 lb 10.9 oz)    Exam:   General:  Uncomfortable. Cold rag on head. oriented  Cardiovascular: RRR without MGR  Respiratory: CTA without WRR  Abdomen: S, NT, ND  Ext: dressing CDI  Basic  Metabolic Panel:  Recent Labs Lab 12/18/14 1741 12/19/14 0442 12/20/14 0350  NA 130* 132* 129*  K 4.6 5.1 4.4  CL 107 107 107  CO2 11* 14* 16*  GLUCOSE 96 94 96  BUN 61* 62* 66*  CREATININE 9.48* 9.56* 9.69*  CALCIUM 8.6* 8.4* 8.5*  PHOS  --   --  4.4   Liver Function Tests:  Recent Labs Lab 12/18/14 1741 12/20/14 0350  AST 17  --   ALT 14*  --   ALKPHOS 32*  --   BILITOT 0.8  --   PROT 8.7*  --   ALBUMIN 2.6* 2.0*   No results for input(s): LIPASE, AMYLASE in the last 168 hours. No results for input(s): AMMONIA in the last 168 hours. CBC:  Recent Labs Lab 12/18/14 1741 12/19/14 0442 12/20/14 0350  WBC 21.3* 23.6* 22.4*  NEUTROABS 19.1*  --   --   HGB 8.4* 8.5* 8.0*  HCT 25.0* 25.4* 24.1*  MCV 83.9 83.6 83.1  PLT 127* 114* 122*   Cardiac Enzymes:  Recent Labs Lab 12/18/14 1741  CKTOTAL 440*   BNP (last 3 results)  Recent Labs  07/30/14 0350  BNP 28.7    ProBNP (last 3 results) No results for input(s): PROBNP in the last 8760 hours.  CBG: No results for input(s): GLUCAP in the last 168 hours.  Recent Results (from the  past 240 hour(s))  Culture, blood (routine x 2)     Status: None (Preliminary result)   Collection Time: 12/18/14  5:41 PM  Result Value Ref Range Status   Specimen Description BLOOD RIGHT HAND  Final   Special Requests BOTTLES DRAWN AEROBIC AND ANAEROBIC 5CC  Final   Culture NO GROWTH 2 DAYS  Final   Report Status PENDING  Incomplete  Culture, blood (routine x 2)     Status: None (Preliminary result)   Collection Time: 12/18/14  6:00 PM  Result Value Ref Range Status   Specimen Description BLOOD RIGHT ARM  Final   Special Requests BOTTLES DRAWN AEROBIC AND ANAEROBIC 5CC  Final   Culture NO GROWTH 2 DAYS  Final   Report Status PENDING  Incomplete  Urine culture     Status: None   Collection Time: 12/18/14  7:10 PM  Result Value Ref Range Status   Specimen Description URINE, RANDOM  Final   Special Requests NONE  Final    Culture 9,000 COLONIES/mL INSIGNIFICANT GROWTH  Final   Report Status 12/20/2014 FINAL  Final  Anaerobic culture     Status: None (Preliminary result)   Collection Time: 12/18/14 11:55 PM  Result Value Ref Range Status   Specimen Description WOUND RIGHT THIGH  Final   Special Requests PT ON DAPTOMYOIN,CLINDAMYCIN AND PRIMAXIN  Final   Gram Stain PENDING  Incomplete   Culture   Final    NO ANAEROBES ISOLATED; CULTURE IN PROGRESS FOR 5 DAYS Performed at [facility]    Report Status PENDING  Incomplete  Gram stain     Status: None   Collection Time: 12/18/14 11:55 PM  Result Value Ref Range Status   Specimen Description WOUND  Final   Special Requests NONE  Final   Gram Stain   Final    MODERATE WBC PRESENT,BOTH PMN AND MONONUCLEAR FEW GRAM POSITIVE COCCI IN PAIRS IN CHAINS Gram Stain Report Called to,Read Back By and Verified With: LOURDIS @0139  12/19/14 MKELLY    Report Status 12/19/2014 FINAL  Final  Culture, routine-abscess     Status: None (Preliminary result)   Collection Time: 12/18/14 11:55 PM  Result Value Ref Range Status   Specimen Description WOUND RIGHT THIGH  Final   Special Requests PT ON DAPTOMYCIN,CLINDAMYCIN AND PRIMAXIN  Final   Gram Stain PENDING  Incomplete   Culture   Final    MODERATE GROUP A STREP (S.PYOGENES) ISOLATED Performed at [facility]    Report Status PENDING  Incomplete     Studies: Dg Chest 2 View  12/18/2014  CLINICAL DATA:  Shortness breath, fever, tachycardia EXAM: CHEST  2 VIEW COMPARISON:  07/30/2014 FINDINGS: Lungs are clear.  No pleural effusion or pneumothorax. The heart is normal in size. Visualized osseous structures are within normal limits. IMPRESSION: Normal chest radiographs. Electronically Signed   By: Julian Hy M.D.   On: 12/18/2014 18:37   Ct Femur Right Wo Contrast  12/18/2014  CLINICAL DATA:  Acute onset right leg pain for 2 days, status post fall. High fever. Initial encounter. EXAM: CT OF  THE RIGHT FEMUR WITHOUT CONTRAST TECHNIQUE: Multidetector CT imaging was performed according to the standard protocol. Multiplanar CT image reconstructions were also generated. COMPARISON:  None. FINDINGS: There is marked diffuse enlargement of the right quadriceps musculature. There is a single focus of focal bulging of the superficial musculature, just below the skin surface, measuring approximately 2.8 x 2.3 cm, approximately 14 cm below the level of  the right greater femoral trochanter. This most likely reflects a focal abscess. Additional vague decreased attenuation is seen tracking within the deeper quadriceps musculature, with diffuse intramuscular edema extending medially, and underlying pyomyositis cannot be excluded. Diffuse surrounding soft tissue inflammation is seen. It is difficult to determine how much of this is drainable without contrast. Per clinical correlation, there is no evidence for compartment syndrome. The underlying osseous structures appear grossly intact, with mild chronic deformity involving the proximal right femur. An intramedullary rod and screws appear grossly intact, without evidence of loosening. The right femoral head remains seated at the acetabulum. A trace knee joint effusion is noted, with a few small loose bodies anteriorly. Mild infrapatellar soft tissue swelling is noted. The visualized bowel structures are grossly unremarkable. The bladder is moderately distended and grossly unremarkable in appearance. A small urachal remnant is incidentally noted. Visualized inguinal nodes remain borderline normal in size. The vasculature is not well assessed without contrast. IMPRESSION: 1. Marked diffuse enlargement of the right quadriceps musculature. Single focus of focal bulging of the superficial musculature, just below the skin surface, measuring 2.8 x 2.3 cm, approximately 14 cm below the level of the right greater femoral trochanter at the proximal right thigh. This most likely  reflects a focal abscess. Additional vague decreased attenuation within the quadriceps musculature, with diffuse intramuscular edema extending medially. Underlying pyomyositis cannot be excluded. It is difficult to determine how much of this is drainable without contrast. 2. Diffuse surrounding soft tissue inflammation noted about the anterior compartment. No evidence for compartment syndrome, per clinical correlation. 3. No evidence for osteomyelitis. 4. Trace right knee joint effusion, with a few small anterior loose bodies and mild infrapatellar soft tissue swelling. These results were called by telephone at the time of interpretation on 12/18/2014 at 10:15 pm to Dr. Eulis Foster, who verbally acknowledged these results. Electronically Signed   By: Garald Balding M.D.   On: 12/18/2014 22:20    Scheduled Meds: . abacavir  600 mg Oral Daily  . amLODipine  5 mg Oral Daily  . azithromycin  1,200 mg Oral Weekly  .  ceFAZolin (ANCEF) IV  1 g Intravenous Q12H  . clindamycin (CLEOCIN) IV  900 mg Intravenous 3 times per day  . dapsone  100 mg Oral Daily  . darbepoetin (ARANESP) injection - NON-DIALYSIS  40 mcg Subcutaneous Q Sun-1800  . darunavir-cobicistat  1 tablet Oral Daily  . dolutegravir  50 mg Oral Daily  . sodium bicarbonate  650 mg Oral TID   Continuous Infusions:   Time spent: 25 minutes  [facility] [email or web address], password [facility] 12/20/2014, 3:19 PM  LOS: 2 days

## 2014-12-20 NOTE — Anesthesia Postprocedure Evaluation (Signed)
  Anesthesia Post-op Note  Patient: Jim Wyatt  Procedure(s) Performed: Procedure(s): IRRIGATION AND DEBRIDEMENT RIGHT LOWER EXTREMITY (Right)  Patient Location: PACU  Anesthesia Type:General  Level of Consciousness: awake and alert   Airway and Oxygen Therapy: Patient Spontanous Breathing  Post-op Pain: mild  Post-op Assessment: Post-op Vital signs reviewed              Post-op Vital Signs: Reviewed  Last Vitals:  Filed Vitals:   12/20/14 1422  BP: 110/54  Pulse: 131  Temp: 37.6 C  Resp: 18    Complications: No apparent anesthesia complications

## 2014-12-21 ENCOUNTER — Encounter (HOSPITAL_COMMUNITY): Payer: Self-pay | Admitting: General Surgery

## 2014-12-21 DIAGNOSIS — N184 Chronic kidney disease, stage 4 (severe): Secondary | ICD-10-CM

## 2014-12-21 DIAGNOSIS — N032 Chronic nephritic syndrome with diffuse membranous glomerulonephritis: Secondary | ICD-10-CM

## 2014-12-21 DIAGNOSIS — B20 Human immunodeficiency virus [HIV] disease: Secondary | ICD-10-CM

## 2014-12-21 DIAGNOSIS — L02415 Cutaneous abscess of right lower limb: Secondary | ICD-10-CM

## 2014-12-21 DIAGNOSIS — R509 Fever, unspecified: Secondary | ICD-10-CM

## 2014-12-21 DIAGNOSIS — Z21 Asymptomatic human immunodeficiency virus [HIV] infection status: Secondary | ICD-10-CM

## 2014-12-21 LAB — VITAMIN B12: Vitamin B-12: 231 pg/mL (ref 180–914)

## 2014-12-21 LAB — CBC
HCT: 23.7 % — ABNORMAL LOW (ref 39.0–52.0)
Hemoglobin: 7.8 g/dL — ABNORMAL LOW (ref 13.0–17.0)
MCH: 27.7 pg (ref 26.0–34.0)
MCHC: 32.9 g/dL (ref 30.0–36.0)
MCV: 84 fL (ref 78.0–100.0)
Platelets: 171 10*3/uL (ref 150–400)
RBC: 2.82 MIL/uL — ABNORMAL LOW (ref 4.22–5.81)
RDW: 14.3 % (ref 11.5–15.5)
WBC: 25.5 10*3/uL — ABNORMAL HIGH (ref 4.0–10.5)

## 2014-12-21 LAB — CULTURE, ROUTINE-ABSCESS

## 2014-12-21 LAB — FERRITIN: Ferritin: 545 ng/mL — ABNORMAL HIGH (ref 24–336)

## 2014-12-21 LAB — IRON AND TIBC
Iron: 14 ug/dL — ABNORMAL LOW (ref 45–182)
Saturation Ratios: 9 % — ABNORMAL LOW (ref 17.9–39.5)
TIBC: 158 ug/dL — ABNORMAL LOW (ref 250–450)
UIBC: 144 ug/dL

## 2014-12-21 LAB — FOLATE: Folate: 8.3 ng/mL (ref >5.9)

## 2014-12-21 LAB — BASIC METABOLIC PANEL
Anion gap: 10 (ref 5–15)
BUN: 70 mg/dL — ABNORMAL HIGH (ref 6–20)
CO2: 15 mmol/L — ABNORMAL LOW (ref 22–32)
Calcium: 8.6 mg/dL — ABNORMAL LOW (ref 8.9–10.3)
Chloride: 107 mmol/L (ref 101–111)
Creatinine, Ser: 11.12 mg/dL — ABNORMAL HIGH (ref 0.61–1.24)
GFR calc Af Amer: 6 mL/min — ABNORMAL LOW (ref >60)
GFR calc non Af Amer: 5 mL/min — ABNORMAL LOW (ref >60)
Glucose, Bld: 78 mg/dL (ref 65–99)
Potassium: 4.6 mmol/L (ref 3.5–5.1)
Sodium: 132 mmol/L — ABNORMAL LOW (ref 135–145)

## 2014-12-21 LAB — T-HELPER CELLS (CD4) COUNT (NOT AT ARMC)
CD4 % Helper T Cell: 1 % — ABNORMAL LOW (ref 33–55)
CD4 T Cell Abs: 20 /uL — ABNORMAL LOW (ref 400–2700)

## 2014-12-21 MED ORDER — CAMPHOR-MENTHOL 0.5-0.5 % EX LOTN
TOPICAL_LOTION | CUTANEOUS | Status: DC | PRN
  Filled 2014-12-21: qty 222

## 2014-12-21 NOTE — Progress Notes (Signed)
3 Days Post-Op  Subjective: Still with significant swelling and discomfort in right thigh but better than before surgery Occasionally febrile  Objective: Vital signs in last 24 hours: Temp:  [99.6 F (37.6 C)-102.2 F (39 C)] 101.4 F (38.6 C) (10/24 0554) Pulse Rate:  [127-134] 134 (10/24 0554) Resp:  [18] 18 (10/24 0554) BP: (110-144)/(54-75) 139/64 mmHg (10/24 0554) SpO2:  [96 %-99 %] 97 % (10/24 0554) Last BM Date: 12/18/14  Intake/Output from previous day: 10/23 0701 - 10/24 0700 In: 798 [P.O.:798] Out: 1300 [Urine:1300] Intake/Output this shift:    General appearance: alert, cooperative and no distress Extremities: Right thigh - edematous; mild erythema down to knee; tender to palpation Wound - clean; no granulation tissue yet; Penrose drain with old bloody drainage; drain removed easily  Lab Results:   Recent Labs  12/20/14 0350 12/21/14 0212  WBC 22.4* 25.5*  HGB 8.0* 7.8*  HCT 24.1* 23.7*  PLT 122* 171   BMET  Recent Labs  12/20/14 0350 12/21/14 0212  NA 129* 132*  K 4.4 4.6  CL 107 107  CO2 16* 15*  GLUCOSE 96 78  BUN 66* 70*  CREATININE 9.69* 11.12*  CALCIUM 8.5* 8.6*   PT/INR No results for input(s): LABPROT, INR in the last 72 hours. ABG  Recent Labs  12/19/14 0342  PHART 7.306*  HCO3 12.8*    Studies/Results: No results found.  Anti-infectives: Anti-infectives    Start     Dose/Rate Route Frequency Ordered Stop   12/20/14 2200  DAPTOmycin (CUBICIN) 500 mg in sodium chloride 0.9 % IVPB  Status:  Discontinued     500 mg 220 mL/hr over 30 Minutes Intravenous Every 48 hours 12/19/14 1017 12/20/14 1318   12/20/14 2200  ceFAZolin (ANCEF) IVPB 1 g/50 mL premix     1 g 100 mL/hr over 30 Minutes Intravenous Every 12 hours 12/20/14 1318     12/20/14 1000  azithromycin (ZITHROMAX) tablet 1,200 mg     1,200 mg Oral Weekly 12/19/14 1031     12/19/14 1530  clindamycin (CLEOCIN) IVPB 900 mg     900 mg 100 mL/hr over 30 Minutes  Intravenous 3 times per day 12/19/14 1438     12/19/14 1045  dapsone tablet 100 mg     100 mg Oral Daily 12/19/14 1031     12/19/14 1045  darunavir-cobicistat (PREZCOBIX) 800-150 MG per tablet 1 tablet  Status:  Discontinued     1 tablet Oral Daily 12/19/14 1031 12/19/14 1033   12/19/14 1000  abacavir (ZIAGEN) tablet 600 mg     600 mg Oral Daily 12/19/14 0115     12/19/14 1000  dolutegravir (TIVICAY) tablet 50 mg     50 mg Oral Daily 12/19/14 0115     12/19/14 1000  darunavir-cobicistat (PREZCOBIX) 800-150 MG per tablet 1 tablet     1 tablet Oral Daily 12/19/14 0357     12/18/14 2130  DAPTOmycin (CUBICIN) 345 mg in sodium chloride 0.9 % IVPB     4 mg/kg  86.2 kg 213.8 mL/hr over 30 Minutes Intravenous  Once 12/18/14 2056 12/18/14 2318   12/18/14 2100  imipenem-cilastatin (PRIMAXIN) 250 mg in sodium chloride 0.9 % 100 mL IVPB  Status:  Discontinued     250 mg 200 mL/hr over 30 Minutes Intravenous Every 12 hours 12/18/14 2037 12/19/14 1438   12/18/14 2030  clindamycin (CLEOCIN) IVPB 900 mg     900 mg 100 mL/hr over 30 Minutes Intravenous  Once 12/18/14 2028 12/18/14 2148  Assessment/Plan: s/p Procedure(s): IRRIGATION AND DEBRIDEMENT RIGHT LOWER EXTREMITY (Right) Continue dressing changes  Antibiotics per Infectious Disease   LOS: 3 days    Vedika Dumlao K. 12/21/2014

## 2014-12-21 NOTE — Progress Notes (Signed)
[facility] PROGRESS NOTE  Jim Wyatt [id number] DOB: 1980/06/25 DOA: 12/18/2014 PCP: [name], MD  Summary 34 y.o. 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 A ct scan of the Thigh was performed and revealed a deep abscess in the quadricepts muscle, and General Surgery was consulted and evaluated him and took him to the ED for an I+D.    Assessment/Plan:  Principal Problem:   Abscess of right thigh s/p I and D. cx growing GAS. 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 Nephrology consulted and signed off. Nauseated postoperatively, but no uremic signs PTA. F/u Dr. [name] after discharge.   Sepsis ([place])   Thrombocytopenia ([place])   Renovascular hypertension   Anemia due to end stage renal disease ([place]): anemia panel and aranesp per pharmacy. -transfuse for < 7    Hyponatremia   AIDS ([place]) last CD4 60, quant 1419 in June   Nausea without vomiting: antiemetics   Code Status:  full Family Communication:  No one at bedside Disposition Plan:    Consultants:  Surgery  ID  nephrology  Procedures:   i and d right thigh abscess  Antibiotics:  Clinda, ancef  HPI/Subjective: Needs to go to the bathroom  Objective: Filed Vitals:   12/21/14 0554  BP: 139/64  Pulse: 134  Temp: 101.4 F (38.6 C)  Resp: 18    Intake/Output Summary (Last 24 hours) at 12/21/14 1031 Last data filed at 12/21/14 0351  Gross per 24 hour  Intake    798 ml  Output   1300 ml  Net   -502 ml   Filed Weights   12/18/14 1729 12/19/14 0119  Weight: 86.183 kg (190 lb) 87.4 kg (192 lb 10.9 oz)    Exam:   General:  flat  Cardiovascular: RRR without MGR  Respiratory: CTA without WRR  Abdomen: S, NT, ND  Ext: dressing CDI  Basic Metabolic Panel:  Recent  Labs Lab 12/18/14 1741 12/19/14 0442 12/20/14 0350 12/21/14 0212  NA 130* 132* 129* 132*  K 4.6 5.1 4.4 4.6  CL 107 107 107 107  CO2 11* 14* 16* 15*  GLUCOSE 96 94 96 78  BUN 61* 62* 66* 70*  CREATININE 9.48* 9.56* 9.69* 11.12*  CALCIUM 8.6* 8.4* 8.5* 8.6*  PHOS  --   --  4.4  --    Liver Function Tests:  Recent Labs Lab 12/18/14 1741 12/20/14 0350  AST 17  --   ALT 14*  --   ALKPHOS 32*  --   BILITOT 0.8  --   PROT 8.7*  --   ALBUMIN 2.6* 2.0*   No results for input(s): LIPASE, AMYLASE in the last 168 hours. No results for input(s): AMMONIA in the last 168 hours. CBC:  Recent Labs Lab 12/18/14 1741 12/19/14 0442 12/20/14 0350 12/21/14 0212  WBC 21.3* 23.6* 22.4* 25.5*  NEUTROABS 19.1*  --   --   --   HGB 8.4* 8.5* 8.0* 7.8*  HCT 25.0* 25.4* 24.1* 23.7*  MCV 83.9 83.6 83.1 84.0  PLT 127* 114* 122* 171   Cardiac Enzymes:  Recent Labs Lab 12/18/14 1741  CKTOTAL 440*   BNP (last 3 results)  Recent Labs  07/30/14 0350  BNP 28.7    ProBNP (last 3 results) No results for  input(s): PROBNP in the last 8760 hours.  CBG: No results for input(s): GLUCAP in the last 168 hours.  Recent Results (from the past 240 hour(s))  Culture, blood (routine x 2)     Status: None (Preliminary result)   Collection Time: 12/18/14  5:41 PM  Result Value Ref Range Status   Specimen Description BLOOD RIGHT HAND  Final   Special Requests BOTTLES DRAWN AEROBIC AND ANAEROBIC 5CC  Final   Culture NO GROWTH 2 DAYS  Final   Report Status PENDING  Incomplete  Culture, blood (routine x 2)     Status: None (Preliminary result)   Collection Time: 12/18/14  6:00 PM  Result Value Ref Range Status   Specimen Description BLOOD RIGHT ARM  Final   Special Requests BOTTLES DRAWN AEROBIC AND ANAEROBIC 5CC  Final   Culture NO GROWTH 2 DAYS  Final   Report Status PENDING  Incomplete  Urine culture     Status: None   Collection Time: 12/18/14  7:10 PM  Result Value Ref Range Status    Specimen Description URINE, RANDOM  Final   Special Requests NONE  Final   Culture 9,000 COLONIES/mL INSIGNIFICANT GROWTH  Final   Report Status 12/20/2014 FINAL  Final  Anaerobic culture     Status: None (Preliminary result)   Collection Time: 12/18/14 11:55 PM  Result Value Ref Range Status   Specimen Description WOUND RIGHT THIGH  Final   Special Requests PT ON DAPTOMYOIN,CLINDAMYCIN AND PRIMAXIN  Final   Gram Stain   Final    FEW WBC PRESENT,BOTH PMN AND MONONUCLEAR NO SQUAMOUS EPITHELIAL CELLS SEEN RARE GRAM POSITIVE COCCI IN PAIRS Performed at [facility]    Culture   Final    NO ANAEROBES ISOLATED; CULTURE IN PROGRESS FOR 5 DAYS Performed at [facility]    Report Status PENDING  Incomplete  Gram stain     Status: None   Collection Time: 12/18/14 11:55 PM  Result Value Ref Range Status   Specimen Description WOUND  Final   Special Requests NONE  Final   Gram Stain   Final    MODERATE WBC PRESENT,BOTH PMN AND MONONUCLEAR FEW GRAM POSITIVE COCCI IN PAIRS IN CHAINS Gram Stain Report Called to,Read Back By and Verified With: LOURDIS @0139  12/19/14 MKELLY    Report Status 12/19/2014 FINAL  Final  Culture, routine-abscess     Status: None (Preliminary result)   Collection Time: 12/18/14 11:55 PM  Result Value Ref Range Status   Specimen Description WOUND RIGHT THIGH  Final   Special Requests PT ON DAPTOMYCIN,CLINDAMYCIN AND PRIMAXIN  Final   Gram Stain   Final    FEW WBC PRESENT,BOTH PMN AND MONONUCLEAR NO SQUAMOUS EPITHELIAL CELLS SEEN RARE GRAM POSITIVE COCCI IN PAIRS Performed at [facility]    Culture   Final    MODERATE GROUP A STREP (S.PYOGENES) ISOLATED Performed at [facility]    Report Status PENDING  Incomplete     Studies: No results found.  Scheduled Meds: . abacavir  600 mg Oral Daily  . amLODipine  5 mg Oral Daily  . azithromycin  1,200 mg Oral Weekly  .  ceFAZolin (ANCEF) IV  1 g Intravenous Q12H  .  clindamycin (CLEOCIN) IV  900 mg Intravenous 3 times per day  . dapsone  100 mg Oral Daily  . darbepoetin (ARANESP) injection - NON-DIALYSIS  40 mcg Subcutaneous Q Sun-1800  . darunavir-cobicistat  1 tablet Oral Daily  . dolutegravir  50 mg Oral Daily  . sodium bicarbonate  650 mg Oral TID   Continuous Infusions:   Time spent: 25 minutes  Raye Wiens  [facility] [email or web address], password [facility] 12/21/2014, 10:31 AM  LOS: 3 days

## 2014-12-21 NOTE — Progress Notes (Signed)
PT Cancellation Note  Patient Details Name: Jim Wyatt MRN: [id number] DOB: [birth year]   Cancelled Treatment:    Reason Eval/Treat Not Completed: Other (comment)   Politely declining PT, is waiting on a tray, and would rather eat lunch first;   Brief discussion re: crutches versus RW -- at this time pt is requesting RW, and I agree if it helps with his confidence getting around (he had fallen with crutches before); will update doc flowsheets;   Roney Marion, [place] Pager [phone] Office [phone]    Roney Marion [facility] 12/21/2014, 2:33 PM

## 2014-12-21 NOTE — Care Management Note (Signed)
Case Management Note  Patient Details  Name: Jim Wyatt MRN: [id number] Date of Birth: [birth year]  Subjective/Objective:                    Action/Plan: PT recommending outpatient PT .  Referral form for outpatient PT on shadow chart , if needed at DC , MD please sign and call case manager , Thanks Magdalen Spatz RN [phone] . However, patient also has dressing changes , unsure at this point if patient will need home health at discharge . Will continue to follow. Magdalen Spatz RN BSN   Expected Discharge Date:  12/22/14               Expected Discharge Plan:     In-House Referral:     Discharge planning Services     Post Acute Care Choice:    Choice offered to:     DME Arranged:    DME Agency:     HH Arranged:    HH Agency:     Status of Service:  In process, will continue to follow  Medicare Important Message Given:    Date Medicare IM Given:    Medicare IM give by:    Date Additional Medicare IM Given:    Additional Medicare Important Message give by:     If discussed at [place] of Stay Meetings, dates discussed:    Additional Comments:  Marilu Favre, RN 12/21/2014, 11:01 AM

## 2014-12-21 NOTE — Progress Notes (Signed)
[facility] for Infectious Disease    Subjective:  C/o taking mx pills at the same time   Antibiotics:  Anti-infectives    Start     Dose/Rate Route Frequency Ordered Stop   12/20/14 2200  DAPTOmycin (CUBICIN) 500 mg in sodium chloride 0.9 % IVPB  Status:  Discontinued     500 mg 220 mL/hr over 30 Minutes Intravenous Every 48 hours 12/19/14 1017 12/20/14 1318   12/20/14 2200  ceFAZolin (ANCEF) IVPB 1 g/50 mL premix     1 g 100 mL/hr over 30 Minutes Intravenous Every 12 hours 12/20/14 1318     12/20/14 1000  azithromycin (ZITHROMAX) tablet 1,200 mg     1,200 mg Oral Weekly 12/19/14 1031     12/19/14 1530  clindamycin (CLEOCIN) IVPB 900 mg     900 mg 100 mL/hr over 30 Minutes Intravenous 3 times per day 12/19/14 1438     12/19/14 1045  dapsone tablet 100 mg     100 mg Oral Daily 12/19/14 1031     12/19/14 1045  darunavir-cobicistat (PREZCOBIX) 800-150 MG per tablet 1 tablet  Status:  Discontinued     1 tablet Oral Daily 12/19/14 1031 12/19/14 1033   12/19/14 1000  abacavir (ZIAGEN) tablet 600 mg     600 mg Oral Daily 12/19/14 0115     12/19/14 1000  dolutegravir (TIVICAY) tablet 50 mg     50 mg Oral Daily 12/19/14 0115     12/19/14 1000  darunavir-cobicistat (PREZCOBIX) 800-150 MG per tablet 1 tablet     1 tablet Oral Daily 12/19/14 0357     12/18/14 2130  DAPTOmycin (CUBICIN) 345 mg in sodium chloride 0.9 % IVPB     4 mg/kg  86.2 kg 213.8 mL/hr over 30 Minutes Intravenous  Once 12/18/14 2056 12/18/14 2318   12/18/14 2100  imipenem-cilastatin (PRIMAXIN) 250 mg in sodium chloride 0.9 % 100 mL IVPB  Status:  Discontinued     250 mg 200 mL/hr over 30 Minutes Intravenous Every 12 hours 12/18/14 2037 12/19/14 1438   12/18/14 2030  clindamycin (CLEOCIN) IVPB 900 mg     900 mg 100 mL/hr over 30 Minutes Intravenous  Once 12/18/14 2028 12/18/14 2148      Medications: Scheduled Meds: . abacavir  600 mg Oral Daily  . amLODipine  5 mg Oral Daily  .  azithromycin  1,200 mg Oral Weekly  .  ceFAZolin (ANCEF) IV  1 g Intravenous Q12H  . clindamycin (CLEOCIN) IV  900 mg Intravenous 3 times per day  . dapsone  100 mg Oral Daily  . darbepoetin (ARANESP) injection - NON-DIALYSIS  40 mcg Subcutaneous Q Sun-1800  . darunavir-cobicistat  1 tablet Oral Daily  . dolutegravir  50 mg Oral Daily  . sodium bicarbonate  650 mg Oral TID   Continuous Infusions:  PRN Meds:.acetaminophen **OR** acetaminophen, alum & mag hydroxide-simeth, camphor-menthol, diphenhydrAMINE, hydrALAZINE, HYDROmorphone (DILAUDID) injection, ondansetron **OR** ondansetron (ZOFRAN) IV, oxyCODONE-acetaminophen **AND** oxyCODONE, oxyCODONE    Objective: Weight change:   Intake/Output Summary (Last 24 hours) at 12/21/14 1332 Last data filed at 12/21/14 0351  Gross per 24 hour  Intake    598 ml  Output    800 ml  Net   -202 ml   Blood pressure 139/64, pulse 134, temperature 101.4 F (38.6 C), temperature source Oral, resp. rate 18, height 5\' 7"  (1.702 m), weight 192 lb 10.9 oz (87.4 kg), SpO2 97 %.  Temp:  [99.6 F (37.6 C)-101.4 F (38.6 C)] 101.4 F (38.6 C) (10/24 0554) Pulse Rate:  [127-134] 134 (10/24 0554) Resp:  [18] 18 (10/24 0554) BP: (110-139)/(54-64) 139/64 mmHg (10/24 0554) SpO2:  [96 %-97 %] 97 % (10/24 0554)  Physical Exam: General: Alert and awake, oriented x3, n Leg with wound packed Neuro: nonfocal  CBC:  CBC Latest Ref Rng 12/21/2014 12/20/2014 12/19/2014  WBC 4.0 - 10.5 K/uL 25.5(H) 22.4(H) 23.6(H)  Hemoglobin 13.0 - 17.0 g/dL 7.8(L) 8.0(L) 8.5(L)  Hematocrit 39.0 - 52.0 % 23.7(L) 24.1(L) 25.4(L)  Platelets 150 - 400 K/uL 171 122(L) 114(L)   CBC Latest Ref Rng 12/21/2014 12/20/2014 12/19/2014  WBC 4.0 - 10.5 K/uL 25.5(H) 22.4(H) 23.6(H)  Hemoglobin 13.0 - 17.0 g/dL 7.8(L) 8.0(L) 8.5(L)  Hematocrit 39.0 - 52.0 % 23.7(L) 24.1(L) 25.4(L)  Platelets 150 - 400 K/uL 171 122(L) 114(L)       BMET  Recent Labs  12/20/14 0350 12/21/14 0212    NA 129* 132*  K 4.4 4.6  CL 107 107  CO2 16* 15*  GLUCOSE 96 78  BUN 66* 70*  CREATININE 9.69* 11.12*  CALCIUM 8.5* 8.6*     Liver Panel   Recent Labs  12/18/14 1741 12/20/14 0350  PROT 8.7*  --   ALBUMIN 2.6* 2.0*  AST 17  --   ALT 14*  --   ALKPHOS 32*  --   BILITOT 0.8  --        Sedimentation Rate  Recent Labs  12/18/14 2048  ESRSEDRATE 122*   C-Reactive Protein No results for input(s): CRP in the last 72 hours.  Micro Results: Recent Results (from the past 720 hour(s))  Culture, blood (routine x 2)     Status: None (Preliminary result)   Collection Time: 12/18/14  5:41 PM  Result Value Ref Range Status   Specimen Description BLOOD RIGHT HAND  Final   Special Requests BOTTLES DRAWN AEROBIC AND ANAEROBIC 5CC  Final   Culture NO GROWTH 2 DAYS  Final   Report Status PENDING  Incomplete  Culture, blood (routine x 2)     Status: None (Preliminary result)   Collection Time: 12/18/14  6:00 PM  Result Value Ref Range Status   Specimen Description BLOOD RIGHT ARM  Final   Special Requests BOTTLES DRAWN AEROBIC AND ANAEROBIC 5CC  Final   Culture NO GROWTH 2 DAYS  Final   Report Status PENDING  Incomplete  Urine culture     Status: None   Collection Time: 12/18/14  7:10 PM  Result Value Ref Range Status   Specimen Description URINE, RANDOM  Final   Special Requests NONE  Final   Culture 9,000 COLONIES/mL INSIGNIFICANT GROWTH  Final   Report Status 12/20/2014 FINAL  Final  Anaerobic culture     Status: None (Preliminary result)   Collection Time: 12/18/14 11:55 PM  Result Value Ref Range Status   Specimen Description WOUND RIGHT THIGH  Final   Special Requests PT ON DAPTOMYOIN,CLINDAMYCIN AND PRIMAXIN  Final   Gram Stain   Final    FEW WBC PRESENT,BOTH PMN AND MONONUCLEAR NO SQUAMOUS EPITHELIAL CELLS SEEN RARE GRAM POSITIVE COCCI IN PAIRS Performed at [facility]    Culture   Final    NO ANAEROBES ISOLATED; CULTURE IN PROGRESS FOR 5  DAYS Performed at [facility]    Report Status PENDING  Incomplete  Gram stain     Status: None   Collection Time: 12/18/14 11:55 PM  Result Value Ref Range Status   Specimen Description WOUND  Final   Special Requests NONE  Final   Gram Stain   Final    MODERATE WBC PRESENT,BOTH PMN AND MONONUCLEAR FEW GRAM POSITIVE COCCI IN PAIRS IN CHAINS Gram Stain Report Called to,Read Back By and Verified With: LOURDIS @0139  12/19/14 MKELLY    Report Status 12/19/2014 FINAL  Final  Culture, routine-abscess     Status: None   Collection Time: 12/18/14 11:55 PM  Result Value Ref Range Status   Specimen Description WOUND RIGHT THIGH  Final   Special Requests PT ON DAPTOMYCIN,CLINDAMYCIN AND PRIMAXIN  Final   Gram Stain   Final    FEW WBC PRESENT,BOTH PMN AND MONONUCLEAR NO SQUAMOUS EPITHELIAL CELLS SEEN RARE GRAM POSITIVE COCCI IN PAIRS Performed at [facility]    Culture   Final    MODERATE GROUP A STREP (S.PYOGENES) ISOLATED Performed at [facility]    Report Status 12/21/2014 FINAL  Final    Studies/Results: No results found.    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 male  with fulminant onset of abscess in muscle with myositis cultures growing group A strep He also has HIV and AIDS that has been fairly poorly controlled though he endorses being compliant with therapy for the last 2 months.  He also has chronic kidney disease and is in need of hemodialysis. He seems to think that he will get a donor kidney have  told him he will likely need dialysis at least as a temporary measure and certainly would not qualify for renal transplantation as of 8 if his HIV remains uncontrolled. 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  #1 Severe infection with abscess myositis due to GAS: I have been concerned by his fevers persisting postop. If they do not go away I would VOTE for return to OR vs repeat imaging of the leg to ensure no occult pocket abscess that needs attention surgically  Narrow to ancef and clindamycin  Greatly appreciate general surgery's critical help here.   #2 HIV and AIDS: Continue his current salvage regimen of TivicayPREZCOBIX and abacavir, dapsone for PCP, azithromycin for M avium  #3 CKD: There is a disconnect in my mind and his understanding of his disease. Dr Jonnie Finner saw as well greatly appreciate his help as well.   LOS: 3 days   Alcide Evener 12/21/2014, 1:32 PM

## 2014-12-22 ENCOUNTER — Inpatient Hospital Stay (HOSPITAL_COMMUNITY): Payer: No Typology Code available for payment source

## 2014-12-22 DIAGNOSIS — D631 Anemia in chronic kidney disease: Secondary | ICD-10-CM

## 2014-12-22 DIAGNOSIS — R1310 Dysphagia, unspecified: Secondary | ICD-10-CM

## 2014-12-22 DIAGNOSIS — B3781 Candidal esophagitis: Secondary | ICD-10-CM | POA: Insufficient documentation

## 2014-12-22 DIAGNOSIS — N186 End stage renal disease: Secondary | ICD-10-CM

## 2014-12-22 DIAGNOSIS — K5900 Constipation, unspecified: Secondary | ICD-10-CM

## 2014-12-22 LAB — CBC
HCT: 22.4 % — ABNORMAL LOW (ref 39.0–52.0)
Hemoglobin: 7.4 g/dL — ABNORMAL LOW (ref 13.0–17.0)
MCH: 27.4 pg (ref 26.0–34.0)
MCHC: 33 g/dL (ref 30.0–36.0)
MCV: 83 fL (ref 78.0–100.0)
Platelets: 214 10*3/uL (ref 150–400)
RBC: 2.7 MIL/uL — ABNORMAL LOW (ref 4.22–5.81)
RDW: 14.6 % (ref 11.5–15.5)
WBC: 22 10*3/uL — ABNORMAL HIGH (ref 4.0–10.5)

## 2014-12-22 LAB — SYNOVIAL CELL COUNT + DIFF, W/ CRYSTALS
Crystals, Fluid: NONE SEEN
Eosinophils-Synovial: 0 % (ref 0–1)
Lymphocytes-Synovial Fld: 6 % (ref 0–20)
Monocyte-Macrophage-Synovial Fluid: 19 % — ABNORMAL LOW (ref 50–90)
Neutrophil, Synovial: 75 % — ABNORMAL HIGH (ref 0–25)
WBC, Synovial: 7477 /mm3 — ABNORMAL HIGH (ref 0–200)

## 2014-12-22 LAB — BASIC METABOLIC PANEL
Anion gap: 10 (ref 5–15)
BUN: 73 mg/dL — ABNORMAL HIGH (ref 6–20)
CO2: 16 mmol/L — ABNORMAL LOW (ref 22–32)
Calcium: 8.3 mg/dL — ABNORMAL LOW (ref 8.9–10.3)
Chloride: 104 mmol/L (ref 101–111)
Creatinine, Ser: 11.88 mg/dL — ABNORMAL HIGH (ref 0.61–1.24)
GFR calc Af Amer: 6 mL/min — ABNORMAL LOW (ref >60)
GFR calc non Af Amer: 5 mL/min — ABNORMAL LOW (ref >60)
Glucose, Bld: 102 mg/dL — ABNORMAL HIGH (ref 65–99)
Potassium: 3.8 mmol/L (ref 3.5–5.1)
Sodium: 130 mmol/L — ABNORMAL LOW (ref 135–145)

## 2014-12-22 MED ORDER — NYSTATIN 100000 UNIT/ML MT SUSP
5.0000 mL | Freq: Four times a day (QID) | OROMUCOSAL | Status: DC
  Administered 2014-12-22 – 2014-12-23 (×3): 500000 [IU] via ORAL
  Filled 2014-12-22 (×3): qty 5

## 2014-12-22 MED ORDER — POLYETHYLENE GLYCOL 3350 17 G PO PACK
17.0000 g | PACK | Freq: Every day | ORAL | Status: DC
  Administered 2014-12-22 – 2014-12-23 (×2): 17 g via ORAL
  Filled 2014-12-22 (×2): qty 1

## 2014-12-22 MED ORDER — BISACODYL 10 MG RE SUPP: 10.0000 mg | Freq: Every day | RECTAL | Status: DC | PRN

## 2014-12-22 MED ORDER — CEPHALEXIN 500 MG PO CAPS
500.0000 mg | ORAL_CAPSULE | Freq: Two times a day (BID) | ORAL | Status: DC
  Administered 2014-12-22 – 2014-12-23 (×2): 500 mg via ORAL
  Filled 2014-12-22 (×2): qty 1

## 2014-12-22 MED ORDER — DARUNAVIR-COBICISTAT 800-150 MG PO TABS
1.0000 | ORAL_TABLET | Freq: Every day | ORAL | Status: DC
  Administered 2014-12-23: 1 via ORAL
  Filled 2014-12-22 (×2): qty 1

## 2014-12-22 MED ORDER — LIDOCAINE HCL (PF) 1 % IJ SOLN
30.0000 mL | Freq: Once | INTRAMUSCULAR | Status: AC
  Administered 2014-12-22: 30 mL via INTRADERMAL
  Filled 2014-12-22: qty 30

## 2014-12-22 MED ORDER — PHENOL 1.4 % MT LIQD
1.0000 | OROMUCOSAL | Status: DC | PRN
  Administered 2014-12-22: 1 via OROMUCOSAL

## 2014-12-22 MED ORDER — FLUCONAZOLE 100 MG PO TABS
100.0000 mg | ORAL_TABLET | Freq: Every day | ORAL | Status: DC
  Administered 2014-12-22 – 2014-12-23 (×2): 100 mg via ORAL
  Filled 2014-12-22 (×2): qty 1
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
## 2014-12-22 NOTE — Progress Notes (Signed)
Patient ID: Jim Wyatt, male   DOB: 10/11/1980, 34 y.o.   MRN: [id number]     [facility]      [address] [place], [place] [address]    Phone: [phone] FAX: [phone]     Subjective:  complains of right knee pain.   Objective:  Vital signs:  Filed Vitals:   12/21/14 1505 12/21/14 2207 12/22/14 0208 12/22/14 0655  BP: 146/71 136/75 134/90 127/71  Pulse: 122 115 114 112  Temp: 99.9 F (37.7 C) 99.7 F (37.6 C) 99.5 F (37.5 C) 99.1 F (37.3 C)  TempSrc: Oral Oral Oral Oral  Resp: _0 Height:      Weight:      SpO2: 95% 94% 94% 94%    Last BM Date: 12/21/14  Intake/Output   Yesterday:  10/24 0701 - 10/25 0700 In: 480 [P.O.:480] Out: 850 [Urine:850] This shift:  Total I/O In: -  Out: 325 [Urine:325]  Physical Exam: General: Pt awake/alert/oriented x4 in no acute distress Skin: right thigh-incision is clean, subq tissue, surrounding skin is without erythema, induration or tenderness.    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   Sepsis ([place])   Thrombocytopenia (HCC)   Chronic kidney disease (CKD), stage IV (severe) (HCC)   Renovascular hypertension   Anemia due to end stage renal disease (HCC)   Hyponatremia   AIDS (HCC)   Nausea without vomiting    Results:   Labs: Results for orders placed or performed during the hospital encounter of 12/18/14 (from the past 48 hour(s))  CBC     Status: Abnormal   Collection Time: 12/21/14  2:12 AM  Result Value Ref Range   WBC 25.5 (H) 4.0 - 10.5 K/uL   RBC 2.82 (L) 4.22 - 5.81 MIL/uL   Hemoglobin 7.8 (L) 13.0 - 17.0 g/dL   HCT 23.7 (L) 39.0 - 52.0 %   MCV 84.0 78.0 - 100.0 fL   MCH 27.7 26.0 - 34.0 pg   MCHC 32.9 30.0 - 36.0 g/dL   RDW 14.3 11.5 - 15.5 %   Platelets 171 150 - 400 K/uL  Vitamin B12     Status: None    Collection Time: 12/21/14  2:12 AM  Result Value Ref Range   Vitamin B-12 231 180 - 914 pg/mL    Comment: (NOTE) This assay is not validated for testing neonatal or myeloproliferative syndrome specimens for Vitamin B12 levels.   Folate     Status: None   Collection Time: 12/21/14  2:12 AM  Result Value Ref Range   Folate 8.3 >5.9 ng/mL  Iron and TIBC     Status: Abnormal   Collection Time: 12/21/14  2:12 AM  Result Value Ref Range   Iron 14 (L) 45 - 182 ug/dL   TIBC 158 (L) 250 - 450 ug/dL   Saturation Ratios 9 (L) 17.9 - 39.5 %   UIBC 144 ug/dL  Ferritin     Status: Abnormal   Collection Time: 12/21/14  2:12 AM  Result Value Ref Range   Ferritin 545 (H) 24 - 336 ng/mL  Basic metabolic panel     Status: Abnormal   Collection Time: 12/21/14  2:12 AM  Result Value Ref Range   Sodium 132 (L) 135 - 145 mmol/L   Potassium 4.6 3.5 - 5.1 mmol/L   Chloride 107  101 - 111 mmol/L   CO2 15 (L) 22 - 32 mmol/L   Glucose, Bld 78 65 - 99 mg/dL   BUN 70 (H) 6 - 20 mg/dL   Creatinine, Ser 11.12 (H) 0.61 - 1.24 mg/dL   Calcium 8.6 (L) 8.9 - 10.3 mg/dL   GFR calc non Af Amer 5 (L) >60 mL/min   GFR calc Af Amer 6 (L) >60 mL/min    Comment: (NOTE) The eGFR has been calculated using the CKD EPI equation. This calculation has not been validated in all clinical situations. eGFR's persistently <60 mL/min signify possible Chronic Kidney Disease.    Anion gap 10 5 - 15    Imaging / Studies: No results found.  Medications / Allergies:  Scheduled Meds: . abacavir  600 mg Oral Daily  . amLODipine  5 mg Oral Daily  . azithromycin  1,200 mg Oral Weekly  .  ceFAZolin (ANCEF) IV  1 g Intravenous Q12H  . clindamycin (CLEOCIN) IV  900 mg Intravenous 3 times per day  . dapsone  100 mg Oral Daily  . darbepoetin (ARANESP) injection - NON-DIALYSIS  40 mcg Subcutaneous Q Sun-1800  . darunavir-cobicistat  1 tablet Oral Daily  . dolutegravir  50 mg Oral Daily  . sodium bicarbonate  650 mg Oral TID    Continuous Infusions:  PRN Meds:.acetaminophen **OR** acetaminophen, alum & mag hydroxide-simeth, camphor-menthol, diphenhydrAMINE, hydrALAZINE, HYDROmorphone (DILAUDID) injection, ondansetron **OR** ondansetron (ZOFRAN) IV, oxyCODONE-acetaminophen **AND** oxyCODONE, oxyCODONE, phenol  Antibiotics: Anti-infectives    Start     Dose/Rate Route Frequency Ordered Stop   12/20/14 2200  DAPTOmycin (CUBICIN) 500 mg in sodium chloride 0.9 % IVPB  Status:  Discontinued     500 mg 220 mL/hr over 30 Minutes Intravenous Every 48 hours 12/19/14 1017 12/20/14 1318   12/20/14 2200  ceFAZolin (ANCEF) IVPB 1 g/50 mL premix     1 g 100 mL/hr over 30 Minutes Intravenous Every 12 hours 12/20/14 1318     12/20/14 1000  azithromycin (ZITHROMAX) tablet 1,200 mg     1,200 mg Oral Weekly 12/19/14 1031     12/19/14 1530  clindamycin (CLEOCIN) IVPB 900 mg     900 mg 100 mL/hr over 30 Minutes Intravenous 3 times per day 12/19/14 1438     12/19/14 1045  dapsone tablet 100 mg     100 mg Oral Daily 12/19/14 1031     12/19/14 1045  darunavir-cobicistat (PREZCOBIX) 800-150 MG per tablet 1 tablet  Status:  Discontinued     1 tablet Oral Daily 12/19/14 1031 12/19/14 1033   12/19/14 1000  abacavir (ZIAGEN) tablet 600 mg     600 mg Oral Daily 12/19/14 0115     12/19/14 1000  dolutegravir (TIVICAY) tablet 50 mg     50 mg Oral Daily 12/19/14 0115     12/19/14 1000  darunavir-cobicistat (PREZCOBIX) 800-150 MG per tablet 1 tablet     1 tablet Oral Daily 12/19/14 0357     12/18/14 2130  DAPTOmycin (CUBICIN) 345 mg in sodium chloride 0.9 % IVPB     4 mg/kg  86.2 kg 213.8 mL/hr over 30 Minutes Intravenous  Once 12/18/14 2056 12/18/14 2318   12/18/14 2100  imipenem-cilastatin (PRIMAXIN) 250 mg in sodium chloride 0.9 % 100 mL IVPB  Status:  Discontinued     250 mg 200 mL/hr over 30 Minutes Intravenous Every 12 hours 12/18/14 2037 12/19/14 1438   12/18/14 2030  clindamycin (CLEOCIN) IVPB 900 mg     900  mg 100 mL/hr over  30 Minutes Intravenous  Once 12/18/14 2028 12/18/14 2148        Assessment/Plan POD#4 I&D RLE---Dr. Rosendo Gros -the wound is clean, there is no surrounding erythema or areas of fluctuance.  I do not think he needs to go back to the OR for further debridement.  He does appear to have an effusion to his right knee.  Could consider ortho consult for aspiration. -antibiotics per primary team  Erby Pian, [facility] Surgery Pager [phone](7A-4:30P) For consults and floor pages call [phone](7A-4:30P)  12/22/2014 9:39 AM

## 2014-12-22 NOTE — Progress Notes (Signed)
[facility] PROGRESS NOTE  Jim Wyatt [id number] DOB: November 25, 1980 DOA: 12/18/2014 PCP: [name], MD  Summary 34 y.o. 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 A ct scan of the Thigh was performed and revealed a deep abscess in the quadricepts muscle, and General Surgery was consulted and evaluated him and took him to the ED for an I+D.    Assessment/Plan:    Abscess of right thigh s/p I and D. cx growing GAS. Change to PO abx  ?effusion- x ray does not show large amount of fluid in joint-- will defer to ortho if tap can be done-patient is having pain    FSGS (focal segmental glomerulosclerosis) with progressive CKD stage 5 now. Nephrology consulted and signed off. Nauseated postoperatively, but no uremic signs PTA. F/u Dr. [name] after discharge.   Sepsis ([place])   Thrombocytopenia ([place])   Renovascular hypertension   Anemia due to end stage renal disease ([place]): anemia panel and aranesp per pharmacy. -transfuse for < 7    Hyponatremia   AIDS ([place]) last CD4 60, quant 1419 in June   Nausea without vomiting: antiemetics Constipation -added miralax and suppository -limit narcotics  Code Status:  full Family Communication:  No one at bedside Disposition Plan:    Consultants:  Surgery  ID  Nephrology  ortho  Procedures:   i and d right thigh abscess  Antibiotics:  Clinda, ancef  HPI/Subjective: Having pain in right knee that started yesterday  Objective: Filed Vitals:   12/22/14 0655  BP: 127/71  Pulse: 112  Temp: 99.1 F (37.3 C)  Resp: 16    Intake/Output Summary (Last 24 hours) at 12/22/14 1417 Last data filed at 12/22/14 0930  Gross per 24 hour  Intake    480 ml  Output    825 ml  Net   -345 ml   Filed Weights   12/18/14 1729 12/19/14 0119  Weight: 86.183 kg (190 lb) 87.4 kg (192 lb 10.9 oz)     Exam:   General:  flat  Cardiovascular: RRR without MGR  Respiratory: CTA without WRR  Abdomen: S, NT, ND  Ext: right knee warm   Basic Metabolic Panel:  Recent Labs Lab 12/18/14 1741 12/19/14 0442 12/20/14 0350 12/21/14 0212 12/22/14 1241  NA 130* 132* 129* 132* 130*  K 4.6 5.1 4.4 4.6 3.8  CL 107 107 107 107 104  CO2 11* 14* 16* 15* 16*  GLUCOSE 96 94 96 78 102*  BUN 61* 62* 66* 70* 73*  CREATININE 9.48* 9.56* 9.69* 11.12* 11.88*  CALCIUM 8.6* 8.4* 8.5* 8.6* 8.3*  PHOS  --   --  4.4  --   --    Liver Function Tests:  Recent Labs Lab 12/18/14 1741 12/20/14 0350  AST 17  --   ALT 14*  --   ALKPHOS 32*  --   BILITOT 0.8  --   PROT 8.7*  --   ALBUMIN 2.6* 2.0*   No results for input(s): LIPASE, AMYLASE in the last 168 hours. No results for input(s): AMMONIA in the last 168 hours. CBC:  Recent Labs Lab 12/18/14 1741 12/19/14 0442 12/20/14 0350 12/21/14 0212 12/22/14 1241  WBC 21.3* 23.6* 22.4* 25.5* 22.0*  NEUTROABS 19.1*  --   --   --   --   HGB 8.4* 8.5* 8.0* 7.8* 7.4*  HCT 25.0*  25.4* 24.1* 23.7* 22.4*  MCV 83.9 83.6 83.1 84.0 83.0  PLT 127* 114* 122* 171 214   Cardiac Enzymes:  Recent Labs Lab 12/18/14 1741  CKTOTAL 440*   BNP (last 3 results)  Recent Labs  07/30/14 0350  BNP 28.7    ProBNP (last 3 results) No results for input(s): PROBNP in the last 8760 hours.  CBG: No results for input(s): GLUCAP in the last 168 hours.  Recent Results (from the past 240 hour(s))  Culture, blood (routine x 2)     Status: None (Preliminary result)   Collection Time: 12/18/14  5:41 PM  Result Value Ref Range Status   Specimen Description BLOOD RIGHT HAND  Final   Special Requests BOTTLES DRAWN AEROBIC AND ANAEROBIC 5CC  Final   Culture NO GROWTH 3 DAYS  Final   Report Status PENDING  Incomplete  Culture, blood (routine x 2)     Status: None (Preliminary result)   Collection Time: 12/18/14  6:00 PM  Result Value Ref Range Status    Specimen Description BLOOD RIGHT ARM  Final   Special Requests BOTTLES DRAWN AEROBIC AND ANAEROBIC 5CC  Final   Culture NO GROWTH 3 DAYS  Final   Report Status PENDING  Incomplete  Urine culture     Status: None   Collection Time: 12/18/14  7:10 PM  Result Value Ref Range Status   Specimen Description URINE, RANDOM  Final   Special Requests NONE  Final   Culture 9,000 COLONIES/mL INSIGNIFICANT GROWTH  Final   Report Status 12/20/2014 FINAL  Final  Anaerobic culture     Status: None (Preliminary result)   Collection Time: 12/18/14 11:55 PM  Result Value Ref Range Status   Specimen Description WOUND RIGHT THIGH  Final   Special Requests PT ON DAPTOMYOIN,CLINDAMYCIN AND PRIMAXIN  Final   Gram Stain   Final    FEW WBC PRESENT,BOTH PMN AND MONONUCLEAR NO SQUAMOUS EPITHELIAL CELLS SEEN RARE GRAM POSITIVE COCCI IN PAIRS Performed at [facility]    Culture   Final    NO ANAEROBES ISOLATED; CULTURE IN PROGRESS FOR 5 DAYS Performed at [facility]    Report Status PENDING  Incomplete  Gram stain     Status: None   Collection Time: 12/18/14 11:55 PM  Result Value Ref Range Status   Specimen Description WOUND  Final   Special Requests NONE  Final   Gram Stain   Final    MODERATE WBC PRESENT,BOTH PMN AND MONONUCLEAR FEW GRAM POSITIVE COCCI IN PAIRS IN CHAINS Gram Stain Report Called to,Read Back By and Verified With: LOURDIS @0139  12/19/14 MKELLY    Report Status 12/19/2014 FINAL  Final  Culture, routine-abscess     Status: None   Collection Time: 12/18/14 11:55 PM  Result Value Ref Range Status   Specimen Description WOUND RIGHT THIGH  Final   Special Requests PT ON DAPTOMYCIN,CLINDAMYCIN AND PRIMAXIN  Final   Gram Stain   Final    FEW WBC PRESENT,BOTH PMN AND MONONUCLEAR NO SQUAMOUS EPITHELIAL CELLS SEEN RARE GRAM POSITIVE COCCI IN PAIRS Performed at [facility]    Culture   Final    MODERATE GROUP A STREP (S.PYOGENES) ISOLATED Performed at  [facility]    Report Status 12/21/2014 FINAL  Final     Studies: Dg Knee Complete 4 Views Right  12/22/2014  CLINICAL DATA:  Patient with pain and swelling in the right knee. Recent aspiration from the joint. EXAM: RIGHT KNEE -  COMPLETE 4+ VIEW COMPARISON:  None. FINDINGS: Distal femoral hardware is demonstrated. Normal anatomic alignment. Mild degenerative changes, predominantly within the lateral compartment. No evidence for acute fracture or dislocation. Small joint effusion. Soft tissue swelling and subcutaneous edema involving the overlying soft tissues. IMPRESSION: Overlying soft tissue swelling and edema. No acute osseous abnormality. Electronically Signed   By: Lovey Newcomer M.D.   On: 12/22/2014 14:13    Scheduled Meds: . abacavir  600 mg Oral Daily  . amLODipine  5 mg Oral Daily  . azithromycin  1,200 mg Oral Weekly  . cephALEXin  500 mg Oral Q12H  . dapsone  100 mg Oral Daily  . darbepoetin (ARANESP) injection - NON-DIALYSIS  40 mcg Subcutaneous Q Sun-1800  . darunavir-cobicistat  1 tablet Oral Daily  . dolutegravir  50 mg Oral Daily  . fluconazole  100 mg Oral Daily  . polyethylene glycol  17 g Oral Daily  . sodium bicarbonate  650 mg Oral TID   Continuous Infusions:   Time spent: 25 minutes  Jim Wyatt, Jim Wyatt  [facility] [email or web address], password [facility] 12/22/2014, 2:17 PM  LOS: 4 days

## 2014-12-22 NOTE — Progress Notes (Signed)
PT Cancellation Note  Patient Details Name: ERICK AYE MRN: [id number] DOB: 1980/07/30   Cancelled Treatment:    Reason Eval/Treat Not Completed: Patient at procedure or test/unavailable. Reportedly at radiology, not in room. Will follow as time allows.    Cassell Clement, PT, CSCS Pager [phone] Office [phone]  12/22/2014, 2:01 PM

## 2014-12-22 NOTE — Progress Notes (Signed)
Physical Therapy Treatment Patient Details Name: Jim Wyatt MRN: [id number] DOB: 08-07-1980 Today's Date: 12/22/2014    History of Present Illness 34 year old male with AIDS/HIV, chronic kidney disease, with a right lower extremity abscess R quadriceps; now s/p I&D; PMH includes femur fx and multiple DVTs    PT Comments    Patient needing heavy encouragement to participate in PT session today. Mobility was slow and multiple breaks were taken by patient throughout. Patient willing to ambulate in room for a total of 30 feet with rw and supervision. Will continue to follow patient and attempt to progress mobility and independence as tolerated.   Follow Up Recommendations  Outpatient PT;Supervision - Intermittent     Equipment Recommendations  Rolling walker with 5" wheels    Recommendations for Other Services       Precautions / Restrictions Precautions Precautions: Fall Restrictions Weight Bearing Restrictions: No    Mobility  Bed Mobility Overal bed mobility: Needs Assistance Bed Mobility: Supine to Sit;Sit to Supine     Supine to sit: HOB elevated;Supervision Sit to supine: HOB elevated;Supervision   General bed mobility comments: Patient using LLE and hands to assist with moving Rt LE, patient not following commands for safe technique, reporting that he will do it his way and "My way always works".   Transfers Overall transfer level: Needs assistance Equipment used: Rolling walker (2 wheeled) Transfers: Sit to/from Stand Sit to Stand: Supervision         General transfer comment: Not following cues for safe technique, using both arms to push up with rw. Patient elevating bed for sit to stand transfer.   Ambulation/Gait Ambulation/Gait assistance: Supervision Ambulation Distance (Feet): 30 Feet Assistive device: Rolling walker (2 wheeled) Gait Pattern/deviations: Step-to pattern;Decreased weight shift to right;Decreased stance time - right Gait velocity:  decreased   General Gait Details: no loss of balance during ambulation, 3 standing breaks which are reportedly due to abdominal pain. Patient refusing to ambulate in hall.    Stairs            Wheelchair Mobility    Modified Rankin (Stroke Patients Only)       Balance Overall balance assessment: Needs assistance Sitting-balance support: No upper extremity supported Sitting balance-Leahy Scale: Good     Standing balance support: During functional activity Standing balance-Leahy Scale: Fair Standing balance comment: using rw                    Cognition Arousal/Alertness: Awake/alert Behavior During Therapy: Flat affect;Agitated (states "you're pissing me off" regarding gait belt) Overall Cognitive Status: Within Functional Limits for tasks assessed                      Exercises      General Comments General comments (skin integrity, edema, etc.): heavy encouragement needed for patient to participate in PT session      Pertinent Vitals/Pain Pain Assessment: Faces (patient not rating pain 0/10, reports that it just hurts) Faces Pain Scale: Hurts even more Pain Location: abdomin and Rt thigh, abdomin in worse Pain Intervention(s): Limited activity within patient's tolerance;Monitored during session    Home Living                      Prior Function            PT Goals (current goals can now be found in the care plan section) Acute Rehab PT Goals Patient Stated Goal: none stated PT Goal  Formulation: With patient Time For Goal Achievement: 12/27/14 Potential to Achieve Goals: Good Progress towards PT goals: Not progressing toward goals - comment (patient with decreased ambulation distance today)    Frequency  Min 5X/week    PT Plan Current plan remains appropriate    Co-evaluation             End of Session Equipment Utilized During Treatment: Other (comment) (patient refusing use of gait belt. ) Activity Tolerance:  Other (comment) (patient not expressing reason for ambulation limits) Patient left: in bed;with call bell/phone within reach     Time: 1448-1511 PT Time Calculation (min) (ACUTE ONLY): 23 min  Charges:  $Therapeutic Activity: 23-37 mins                    G Codes:      Cassell Clement, PT, CSCS Pager [phone] Office [phone]  12/22/2014, 3:29 PM

## 2014-12-22 NOTE — Progress Notes (Signed)
[facility]    Subjective:  C/o d dysphagia, constipation   Antibiotics:  Anti-infectives    Start     Dose/Rate Route Frequency Ordered Stop   12/22/14 1600  cephALEXin (KEFLEX) capsule 500 mg     500 mg Oral Every 12 hours 12/22/14 1003     12/22/14 1015  fluconazole (DIFLUCAN) tablet 100 mg     100 mg Oral Daily 12/22/14 1003     12/20/14 2200  DAPTOmycin (CUBICIN) 500 mg in sodium chloride 0.9 % IVPB  Status:  Discontinued     500 mg 220 mL/hr over 30 Minutes Intravenous Every 48 hours 12/19/14 1017 12/20/14 1318   12/20/14 2200  ceFAZolin (ANCEF) IVPB 1 g/50 mL premix  Status:  Discontinued     1 g 100 mL/hr over 30 Minutes Intravenous Every 12 hours 12/20/14 1318 12/22/14 1003   12/20/14 1000  azithromycin (ZITHROMAX) tablet 1,200 mg     1,200 mg Oral Weekly 12/19/14 1031     12/19/14 1530  clindamycin (CLEOCIN) IVPB 900 mg  Status:  Discontinued     900 mg 100 mL/hr over 30 Minutes Intravenous 3 times per day 12/19/14 1438 12/22/14 1003   12/19/14 1045  dapsone tablet 100 mg     100 mg Oral Daily 12/19/14 1031     12/19/14 1045  darunavir-cobicistat (PREZCOBIX) 800-150 MG per tablet 1 tablet  Status:  Discontinued     1 tablet Oral Daily 12/19/14 1031 12/19/14 1033   12/19/14 1000  abacavir (ZIAGEN) tablet 600 mg     600 mg Oral Daily 12/19/14 0115     12/19/14 1000  dolutegravir (TIVICAY) tablet 50 mg     50 mg Oral Daily 12/19/14 0115     12/19/14 1000  darunavir-cobicistat (PREZCOBIX) 800-150 MG per tablet 1 tablet     1 tablet Oral Daily 12/19/14 0357     12/18/14 2130  DAPTOmycin (CUBICIN) 345 mg in sodium chloride 0.9 % IVPB     4 mg/kg  86.2 kg 213.8 mL/hr over 30 Minutes Intravenous  Once 12/18/14 2056 12/18/14 2318   12/18/14 2100  imipenem-cilastatin (PRIMAXIN) 250 mg in sodium chloride 0.9 % 100 mL IVPB  Status:  Discontinued     250 mg 200 mL/hr over 30 Minutes Intravenous Every 12 hours 12/18/14 2037  12/19/14 1438   12/18/14 2030  clindamycin (CLEOCIN) IVPB 900 mg     900 mg 100 mL/hr over 30 Minutes Intravenous  Once 12/18/14 2028 12/18/14 2148      Medications: Scheduled Meds: . abacavir  600 mg Oral Daily  . amLODipine  5 mg Oral Daily  . azithromycin  1,200 mg Oral Weekly  . cephALEXin  500 mg Oral Q12H  . dapsone  100 mg Oral Daily  . darbepoetin (ARANESP) injection - NON-DIALYSIS  40 mcg Subcutaneous Q Sun-1800  . darunavir-cobicistat  1 tablet Oral Daily  . dolutegravir  50 mg Oral Daily  . fluconazole  100 mg Oral Daily  . sodium bicarbonate  650 mg Oral TID   Continuous Infusions:  PRN Meds:.acetaminophen **OR** acetaminophen, alum & mag hydroxide-simeth, camphor-menthol, diphenhydrAMINE, hydrALAZINE, HYDROmorphone (DILAUDID) injection, ondansetron **OR** ondansetron (ZOFRAN) IV, oxyCODONE-acetaminophen **AND** oxyCODONE, oxyCODONE, phenol    Objective: Weight change:   Intake/Output Summary (Last 24 hours) at 12/22/14 1058 Last data filed at 12/22/14 0705  Gross per 24 hour  Intake    480 ml  Output  825 ml  Net   -345 ml   Blood pressure 127/71, pulse 112, temperature 99.1 F (37.3 C), temperature source Oral, resp. rate 16, height 5\' 7"  (1.702 m), weight 192 lb 10.9 oz (87.4 kg), SpO2 94 %. Temp:  [99.1 F (37.3 C)-99.9 F (37.7 C)] 99.1 F (37.3 C) (10/25 0655) Pulse Rate:  [112-122] 112 (10/25 0655) Resp:  [16] 16 (10/25 0655) BP: (127-146)/(71-90) 127/71 mmHg (10/25 0655) SpO2:  [94 %-95 %] 94 % (10/25 0655)  Physical Exam: General: Alert and awake, oriented x3, n Leg with wound packed Neuro: nonfocal  CBC:  CBC Latest Ref Rng 12/21/2014 12/20/2014 12/19/2014  WBC 4.0 - 10.5 K/uL 25.5(H) 22.4(H) 23.6(H)  Hemoglobin 13.0 - 17.0 g/dL 7.8(L) 8.0(L) 8.5(L)  Hematocrit 39.0 - 52.0 % 23.7(L) 24.1(L) 25.4(L)  Platelets 150 - 400 K/uL 171 122(L) 114(L)   CBC Latest Ref Rng 12/21/2014 12/20/2014 12/19/2014  WBC 4.0 - 10.5 K/uL 25.5(H) 22.4(H)  23.6(H)  Hemoglobin 13.0 - 17.0 g/dL 7.8(L) 8.0(L) 8.5(L)  Hematocrit 39.0 - 52.0 % 23.7(L) 24.1(L) 25.4(L)  Platelets 150 - 400 K/uL 171 122(L) 114(L)       BMET  Recent Labs  12/20/14 0350 12/21/14 0212  NA 129* 132*  K 4.4 4.6  CL 107 107  CO2 16* 15*  GLUCOSE 96 78  BUN 66* 70*  CREATININE 9.69* 11.12*  CALCIUM 8.5* 8.6*     Liver Panel   Recent Labs  12/20/14 0350  ALBUMIN 2.0*       Sedimentation Rate No results for input(s): ESRSEDRATE in the last 72 hours. C-Reactive Protein No results for input(s): CRP in the last 72 hours.  Micro Results: Recent Results (from the past 720 hour(s))  Culture, blood (routine x 2)     Status: None (Preliminary result)   Collection Time: 12/18/14  5:41 PM  Result Value Ref Range Status   Specimen Description BLOOD RIGHT HAND  Final   Special Requests BOTTLES DRAWN AEROBIC AND ANAEROBIC 5CC  Final   Culture NO GROWTH 3 DAYS  Final   Report Status PENDING  Incomplete  Culture, blood (routine x 2)     Status: None (Preliminary result)   Collection Time: 12/18/14  6:00 PM  Result Value Ref Range Status   Specimen Description BLOOD RIGHT ARM  Final   Special Requests BOTTLES DRAWN AEROBIC AND ANAEROBIC 5CC  Final   Culture NO GROWTH 3 DAYS  Final   Report Status PENDING  Incomplete  Urine culture     Status: None   Collection Time: 12/18/14  7:10 PM  Result Value Ref Range Status   Specimen Description URINE, RANDOM  Final   Special Requests NONE  Final   Culture 9,000 COLONIES/mL INSIGNIFICANT GROWTH  Final   Report Status 12/20/2014 FINAL  Final  Anaerobic culture     Status: None (Preliminary result)   Collection Time: 12/18/14 11:55 PM  Result Value Ref Range Status   Specimen Description WOUND RIGHT THIGH  Final   Special Requests PT ON DAPTOMYOIN,CLINDAMYCIN AND PRIMAXIN  Final   Gram Stain   Final    FEW WBC PRESENT,BOTH PMN AND MONONUCLEAR NO SQUAMOUS EPITHELIAL CELLS SEEN RARE GRAM POSITIVE COCCI IN  PAIRS Performed at [facility]    Culture   Final    NO ANAEROBES ISOLATED; CULTURE IN PROGRESS FOR 5 DAYS Performed at [facility]    Report Status PENDING  Incomplete  Gram stain     Status: None  Collection Time: 12/18/14 11:55 PM  Result Value Ref Range Status   Specimen Description WOUND  Final   Special Requests NONE  Final   Gram Stain   Final    MODERATE WBC PRESENT,BOTH PMN AND MONONUCLEAR FEW GRAM POSITIVE COCCI IN PAIRS IN CHAINS Gram Stain Report Called to,Read Back By and Verified With: LOURDIS @0139  12/19/14 MKELLY    Report Status 12/19/2014 FINAL  Final  Culture, routine-abscess     Status: None   Collection Time: 12/18/14 11:55 PM  Result Value Ref Range Status   Specimen Description WOUND RIGHT THIGH  Final   Special Requests PT ON DAPTOMYCIN,CLINDAMYCIN AND PRIMAXIN  Final   Gram Stain   Final    FEW WBC PRESENT,BOTH PMN AND MONONUCLEAR NO SQUAMOUS EPITHELIAL CELLS SEEN RARE GRAM POSITIVE COCCI IN PAIRS Performed at [facility]    Culture   Final    MODERATE GROUP A STREP (S.PYOGENES) ISOLATED Performed at [facility]    Report Status 12/21/2014 FINAL  Final    Studies/Results: No results found.    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 male  with fulminant onset of abscess in muscle with myositis cultures growing group A strep He also has HIV and AIDS that has been fairly poorly controlled though he endorses being compliant with therapy for the last 2 months.  He also  has chronic kidney disease and is in need of hemodialysis. He seems to think that he will get a donor kidney have told him he will likely need dialysis at least as a temporary measure and certainly would not qualify for renal transplantation as of 8 if his HIV remains uncontrolled. 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  #1 Severe infection with abscess myositis due to GAS: Fevers defervescing and CCS following  Change to PO keflex 500mg  po BID and finish up 2 weeks of postoperative antibiotics  Greatly appreciate general surgery's critical help here.   #2 HIV and AIDS: Continue his current salvage regimen of TivicayPREZCOBIX and abacavir, dapsone for PCP, azithromycin for M avium  #3 Dysphagia: will give fluconazole 100mg  daily in case he has esophageal candidasis  #4 constipation: defer to primary team, counselled him to reduce any narcotics he may be getting for pain  #3 CKD: There is a disconnect in my mind and his understanding of his disease. Dr Jonnie Finner saw as well greatly appreciate his help as well. IF he wants transplant his HIV will need to be under control and he should go to [facility] for evaluation.  I will arrange for [facility] for him in the next 2 weeks  I will otherwise sign off for now  Please call with further questions.  LOS: 4 days   Alcide Evener 12/22/2014, 10:58 AM

## 2014-12-23 LAB — CULTURE, BLOOD (ROUTINE X 2)
Culture: NO GROWTH
Culture: NO GROWTH

## 2014-12-23 LAB — CBC
HCT: 22.5 % — ABNORMAL LOW (ref 39.0–52.0)
Hemoglobin: 7.3 g/dL — ABNORMAL LOW (ref 13.0–17.0)
MCH: 27 pg (ref 26.0–34.0)
MCHC: 32.4 g/dL (ref 30.0–36.0)
MCV: 83.3 fL (ref 78.0–100.0)
Platelets: 231 10*3/uL (ref 150–400)
RBC: 2.7 MIL/uL — ABNORMAL LOW (ref 4.22–5.81)
RDW: 14.3 % (ref 11.5–15.5)
WBC: 19.7 10*3/uL — ABNORMAL HIGH (ref 4.0–10.5)

## 2014-12-23 LAB — BASIC METABOLIC PANEL
Anion gap: 11 (ref 5–15)
BUN: 74 mg/dL — ABNORMAL HIGH (ref 6–20)
CO2: 17 mmol/L — ABNORMAL LOW (ref 22–32)
Calcium: 8.2 mg/dL — ABNORMAL LOW (ref 8.9–10.3)
Chloride: 100 mmol/L — ABNORMAL LOW (ref 101–111)
Creatinine, Ser: 12.19 mg/dL — ABNORMAL HIGH (ref 0.61–1.24)
GFR calc Af Amer: 6 mL/min — ABNORMAL LOW (ref >60)
GFR calc non Af Amer: 5 mL/min — ABNORMAL LOW (ref >60)
Glucose, Bld: 107 mg/dL — ABNORMAL HIGH (ref 65–99)
Potassium: 3.9 mmol/L (ref 3.5–5.1)
Sodium: 128 mmol/L — ABNORMAL LOW (ref 135–145)

## 2014-12-23 LAB — ANAEROBIC CULTURE

## 2014-12-23 MED ORDER — CEPHALEXIN 500 MG PO CAPS: 500.0000 mg | ORAL_CAPSULE | Freq: Two times a day (BID) | ORAL | Status: DC

## 2014-12-23 MED ORDER — METOPROLOL TARTRATE 12.5 MG HALF TABLET
12.5000 mg | ORAL_TABLET | Freq: Two times a day (BID) | ORAL | Status: DC
  Administered 2014-12-23: 12.5 mg via ORAL
  Filled 2014-12-23: qty 1

## 2014-12-23 MED ORDER — OXYCODONE HCL 5 MG PO TABS: 5.0000 mg | ORAL_TABLET | ORAL | Status: DC | PRN

## 2014-12-23 MED ORDER — FLUCONAZOLE 100 MG PO TABS: 100.0000 mg | ORAL_TABLET | Freq: Every day | ORAL | Status: DC

## 2014-12-23 MED ORDER — NYSTATIN 100000 UNIT/ML MT SUSP: 5.0000 mL | Freq: Four times a day (QID) | OROMUCOSAL | Status: DC

## 2014-12-23 MED ORDER — METOPROLOL TARTRATE 25 MG PO TABS
12.5000 mg | ORAL_TABLET | Freq: Two times a day (BID) | ORAL | Status: DC
Start: 2014-12-23 — End: 2015-01-12

## 2014-12-23 NOTE — Progress Notes (Signed)
Patient ID: Jim Wyatt, male   DOB: [birth year], 34 y.o.   MRN: [id number]     [facility]      [address] [place], [place] [address]    Phone: [phone] FAX: [phone]     Subjective: No concerns re wound.    Objective:  Vital signs:  Filed Vitals:   12/22/14 1448 12/22/14 2139 12/22/14 2200 12/23/14 0632  BP: 141/79 172/89 146/75 161/85  Pulse: 110 122 108 110  Temp: 98.9 F (37.2 C) 98.4 F (36.9 C)  98.1 F (36.7 C)  TempSrc: Oral Oral  Oral  Resp: _0 Height:      Weight:      SpO2: 100% 96%  95%    Last BM Date: 12/22/14  Intake/Output   Yesterday:  10/25 0701 - 10/26 0700 In: 480 [P.O.:480] Out: 325 [Urine:325] This shift: I/O last 3 completed shifts: In: 480 [P.O.:480] Out: 600 [Urine:600]    Physical Exam: General: Pt awake/alert/oriented x4 in no acute distress Skin: RLE wound is clean, surrounding skin is soft and without erythema or induration, packing replaced.    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   Sepsis ([facility])   Thrombocytopenia (HCC)   Chronic kidney disease (CKD), stage IV (severe) (HCC)   Renovascular hypertension   Anemia due to end stage renal disease (HCC)   Hyponatremia   AIDS (HCC)   Nausea without vomiting   Candidal esophagitis (HCC)   Constipation    Results:   Labs: Results for orders placed or performed during the hospital encounter of 12/18/14 (from the past 48 hour(s))  CBC     Status: Abnormal   Collection Time: 12/22/14 12:41 PM  Result Value Ref Range   WBC 22.0 (H) 4.0 - 10.5 K/uL   RBC 2.70 (L) 4.22 - 5.81 MIL/uL   Hemoglobin 7.4 (L) 13.0 - 17.0 g/dL   HCT 22.4 (L) 39.0 - 52.0 %   MCV 83.0 78.0 - 100.0 fL   MCH 27.4 26.0 - 34.0 pg   MCHC 33.0 30.0 - 36.0 g/dL   RDW 14.6 11.5 - 15.5 %   Platelets 214 150 - 400 K/uL   Basic metabolic panel     Status: Abnormal   Collection Time: 12/22/14 12:41 PM  Result Value Ref Range   Sodium 130 (L) 135 - 145 mmol/L   Potassium 3.8 3.5 - 5.1 mmol/L   Chloride 104 101 - 111 mmol/L   CO2 16 (L) 22 - 32 mmol/L   Glucose, Bld 102 (H) 65 - 99 mg/dL   BUN 73 (H) 6 - 20 mg/dL   Creatinine, Ser 11.88 (H) 0.61 - 1.24 mg/dL   Calcium 8.3 (L) 8.9 - 10.3 mg/dL   GFR calc non Af Amer 5 (L) >60 mL/min   GFR calc Af Amer 6 (L) >60 mL/min    Comment: (NOTE) The eGFR has been calculated using the CKD EPI equation. This calculation has not been validated in all clinical situations. eGFR's persistently <60 mL/min signify possible Chronic Kidney Disease.    Anion gap 10 5 - 15  Body fluid culture     Status: None (Preliminary result)   Collection Time: 12/22/14  1:45 PM  Result Value Ref Range   Specimen Description FLUID KNEE RIGHT SYNOVIAL    Special Requests NONE    Gram Stain  RARE WBC PRESENT,BOTH PMN AND MONONUCLEAR NO ORGANISMS SEEN    Culture NO GROWTH < 24 HOURS    Report Status PENDING   Synovial cell count + diff, w/ crystals     Status: Abnormal   Collection Time: 12/22/14  1:45 PM  Result Value Ref Range   Color, Synovial YELLOW YELLOW   Appearance-Synovial CLOUDY (A) CLEAR   Crystals, Fluid NO CRYSTALS SEEN    WBC, Synovial 7477 (H) 0 - 200 /cu mm   Neutrophil, Synovial 75 (H) 0 - 25 %   Lymphocytes-Synovial Fld 6 0 - 20 %   Monocyte-Macrophage-Synovial Fluid 19 (L) 50 - 90 %   Eosinophils-Synovial 0 0 - 1 %  CBC     Status: Abnormal   Collection Time: 12/22/14 11:25 PM  Result Value Ref Range   WBC 19.7 (H) 4.0 - 10.5 K/uL   RBC 2.70 (L) 4.22 - 5.81 MIL/uL   Hemoglobin 7.3 (L) 13.0 - 17.0 g/dL   HCT 22.5 (L) 39.0 - 52.0 %   MCV 83.3 78.0 - 100.0 fL   MCH 27.0 26.0 - 34.0 pg   MCHC 32.4 30.0 - 36.0 g/dL   RDW 14.3 11.5 - 15.5 %   Platelets 231 150 - 400 K/uL  Basic metabolic panel     Status: Abnormal   Collection Time: 12/22/14 11:25 PM   Result Value Ref Range   Sodium 128 (L) 135 - 145 mmol/L   Potassium 3.9 3.5 - 5.1 mmol/L   Chloride 100 (L) 101 - 111 mmol/L   CO2 17 (L) 22 - 32 mmol/L   Glucose, Bld 107 (H) 65 - 99 mg/dL   BUN 74 (H) 6 - 20 mg/dL   Creatinine, Ser 12.19 (H) 0.61 - 1.24 mg/dL   Calcium 8.2 (L) 8.9 - 10.3 mg/dL   GFR calc non Af Amer 5 (L) >60 mL/min   GFR calc Af Amer 6 (L) >60 mL/min    Comment: (NOTE) The eGFR has been calculated using the CKD EPI equation. This calculation has not been validated in all clinical situations. eGFR's persistently <60 mL/min signify possible Chronic Kidney Disease.    Anion gap 11 5 - 15    Imaging / Studies: Dg Knee Complete 4 Views Right  12/22/2014  CLINICAL DATA:  Patient with pain and swelling in the right knee. Recent aspiration from the joint. EXAM: RIGHT KNEE - COMPLETE 4+ VIEW COMPARISON:  None. FINDINGS: Distal femoral hardware is demonstrated. Normal anatomic alignment. Mild degenerative changes, predominantly within the lateral compartment. No evidence for acute fracture or dislocation. Small joint effusion. Soft tissue swelling and subcutaneous edema involving the overlying soft tissues. IMPRESSION: Overlying soft tissue swelling and edema. No acute osseous abnormality. Electronically Signed   By: Lovey Newcomer M.D.   On: 12/22/2014 14:13    Medications / Allergies:  Scheduled Meds: . abacavir  600 mg Oral Daily  . amLODipine  5 mg Oral Daily  . azithromycin  1,200 mg Oral Weekly  . cephALEXin  500 mg Oral Q12H  . dapsone  100 mg Oral Daily  . darbepoetin (ARANESP) injection - NON-DIALYSIS  40 mcg Subcutaneous Q Sun-1800  . darunavir-cobicistat  1 tablet Oral Q breakfast  . dolutegravir  50 mg Oral Daily  . fluconazole  100 mg Oral Daily  . nystatin  5 mL Oral QID  . polyethylene glycol  17 g Oral Daily  . sodium bicarbonate  650 mg Oral TID   Continuous Infusions:  PRN Meds:.acetaminophen **  OR** acetaminophen, alum & mag hydroxide-simeth,  bisacodyl, camphor-menthol, diphenhydrAMINE, hydrALAZINE, HYDROmorphone (DILAUDID) injection, ondansetron **OR** ondansetron (ZOFRAN) IV, oxyCODONE-acetaminophen **AND** oxyCODONE, oxyCODONE, phenol  Antibiotics: Anti-infectives    Start     Dose/Rate Route Frequency Ordered Stop   12/23/14 0800  darunavir-cobicistat (PREZCOBIX) 800-150 MG per tablet 1 tablet     1 tablet Oral Daily with breakfast 12/22/14 1431     12/22/14 1600  cephALEXin (KEFLEX) capsule 500 mg     500 mg Oral Every 12 hours 12/22/14 1003     12/22/14 1015  fluconazole (DIFLUCAN) tablet 100 mg     100 mg Oral Daily 12/22/14 1003     12/20/14 2200  DAPTOmycin (CUBICIN) 500 mg in sodium chloride 0.9 % IVPB  Status:  Discontinued     500 mg 220 mL/hr over 30 Minutes Intravenous Every 48 hours 12/19/14 1017 12/20/14 1318   12/20/14 2200  ceFAZolin (ANCEF) IVPB 1 g/50 mL premix  Status:  Discontinued     1 g 100 mL/hr over 30 Minutes Intravenous Every 12 hours 12/20/14 1318 12/22/14 1003   12/20/14 1000  azithromycin (ZITHROMAX) tablet 1,200 mg     1,200 mg Oral Weekly 12/19/14 1031     12/19/14 1530  clindamycin (CLEOCIN) IVPB 900 mg  Status:  Discontinued     900 mg 100 mL/hr over 30 Minutes Intravenous 3 times per day 12/19/14 1438 12/22/14 1003   12/19/14 1045  dapsone tablet 100 mg     100 mg Oral Daily 12/19/14 1031     12/19/14 1045  darunavir-cobicistat (PREZCOBIX) 800-150 MG per tablet 1 tablet  Status:  Discontinued     1 tablet Oral Daily 12/19/14 1031 12/19/14 1033   12/19/14 1000  abacavir (ZIAGEN) tablet 600 mg     600 mg Oral Daily 12/19/14 0115     12/19/14 1000  dolutegravir (TIVICAY) tablet 50 mg     50 mg Oral Daily 12/19/14 0115     12/19/14 1000  darunavir-cobicistat (PREZCOBIX) 800-150 MG per tablet 1 tablet  Status:  Discontinued     1 tablet Oral Daily 12/19/14 0357 12/22/14 1431   12/18/14 2130  DAPTOmycin (CUBICIN) 345 mg in sodium chloride 0.9 % IVPB     4 mg/kg  86.2 kg 213.8 mL/hr over 30  Minutes Intravenous  Once 12/18/14 2056 12/18/14 2318   12/18/14 2100  imipenem-cilastatin (PRIMAXIN) 250 mg in sodium chloride 0.9 % 100 mL IVPB  Status:  Discontinued     250 mg 200 mL/hr over 30 Minutes Intravenous Every 12 hours 12/18/14 2037 12/19/14 1438   12/18/14 2030  clindamycin (CLEOCIN) IVPB 900 mg     900 mg 100 mL/hr over 30 Minutes Intravenous  Once 12/18/14 2028 12/18/14 2148          Assessment/Plan POD#5 I&D RLE---Dr. Rosendo Gros -wound is clean, WBC is tending down and afebrile.  No concerns for further abscesses.  He needs instruction on wet today dressing changes and home health.  Antibiotics per ID recs.   -dispo per primary team  Erby Pian, ANP-BC [facility] Pager [phone](7A-4:30P) For consults and floor pages call [phone](7A-4:30P)  12/23/2014 9:26 AM

## 2014-12-23 NOTE — Progress Notes (Signed)
Patient discharged to home with instructions, wheelchair from advance sent with patient.

## 2014-12-23 NOTE — Care Management Note (Signed)
Case Management Note  Patient Details  Name: KEISHAWN FANELLA MRN: [id number] Date of Birth: 05-Aug-1980  Subjective/Objective:                    Action/Plan:  Outpatient PT referral form faxed to [facility] . Expected Discharge Date:  12/22/14               Expected Discharge Plan:  Home/Self Care  In-House Referral:     Discharge planning Services  CM Consult  Post Acute Care Choice:    Choice offered to:  Patient  DME Arranged:    DME Agency:     HH Arranged:    [place] Agency:     Status of Service:  Completed, signed off  Medicare Important Message Given:    Date Medicare IM Given:    Medicare IM give by:    Date Additional Medicare IM Given:    Additional Medicare Important Message give by:     If discussed at [place] of Stay Meetings, dates discussed:    Additional Comments:  Marilu Favre, RN 12/23/2014, 9:41 AM

## 2014-12-23 NOTE — Progress Notes (Signed)
Physical Therapy Treatment Patient Details Name: Jim Wyatt MRN: [id number] DOB: Apr 18, 1980 Today's Date: 12/23/2014    History of Present Illness 34 year old male with AIDS/HIV, chronic kidney disease, with a right lower extremity abscess R quadriceps; now s/p I&D; PMH includes femur fx and multiple DVTs    PT Comments    Patient making gradual progress with PT. Patient able to increase his ambulation to 125 feet today but remains slow and frequent standing breaks required. Mobility limited by patient's reports of pain and fatigue. Patient resistant to cues for safety during session, refusing use of gait belt and suggestions for mobility. Will continue to follow and progress mobility as tolerated as well as encouraging safety with independence.   Follow Up Recommendations  Outpatient PT;Supervision - Intermittent     Equipment Recommendations  Rolling walker with 5" wheels    Recommendations for Other Services       Precautions / Restrictions Precautions Precautions: Fall Restrictions Weight Bearing Restrictions: No    Mobility  Bed Mobility Overal bed mobility: Needs Assistance Bed Mobility: Supine to Sit     Supine to sit: Supervision     General bed mobility comments: hooking Rt LE with Lt LE.   Transfers Overall transfer level: Needs assistance Equipment used: None Transfers: Sit to/from Stand Sit to Stand: Supervision         General transfer comment: standing with bed rail. Refusing use of rw or gait belt.   Ambulation/Gait Ambulation/Gait assistance: Supervision Ambulation Distance (Feet): 125 Feet Assistive device: Rolling walker (2 wheeled) Gait Pattern/deviations: Step-to pattern;Decreased weight shift to right;Decreased stance time - right Gait velocity: decreased   General Gait Details: frequent standing breaks taken during session, slow pattern but no loss of balance.    Stairs            Wheelchair Mobility    Modified  [place] (Stroke Patients Only)       Balance Overall balance assessment: Needs assistance Sitting-balance support: No upper extremity supported Sitting balance-Leahy Scale: Good     Standing balance support: During functional activity Standing balance-Leahy Scale: Fair Standing balance comment: using rw                    Cognition Arousal/Alertness: Awake/alert Behavior During Therapy: Flat affect Overall Cognitive Status: Within Functional Limits for tasks assessed                      Exercises      General Comments General comments (skin integrity, edema, etc.): Cues for safety during session, patient refusing use of gait belt and cues for safety during session.       Pertinent Vitals/Pain Pain Assessment: 0-10 Pain Score: 10-Worst pain ever (reporting pain as 30 out of 10. ) Pain Location: Rt leg, throat, abdomin Pain Descriptors / Indicators:  (pain) Pain Intervention(s): Monitored during session;Limited activity within patient's tolerance    Home Living                      Prior Function            PT Goals (current goals can now be found in the care plan section) Acute Rehab PT Goals Patient Stated Goal: get some rest. PT Goal Formulation: With patient Time For Goal Achievement: 12/27/14 Potential to Achieve Goals: Good Progress towards PT goals: Progressing toward goals    Frequency  Min 5X/week    PT Plan Current plan remains appropriate  Co-evaluation             End of Session Equipment Utilized During Treatment: Other (comment) (refusing use of gait belt) Activity Tolerance: Patient limited by fatigue;Patient limited by pain Patient left: in chair;with call bell/phone within reach;with nursing/sitter in room     Time: 0924-0959 PT Time Calculation (min) (ACUTE ONLY): 35 min  Charges:  $Gait Training: 23-37 mins                    G Codes:      Cassell Clement, PT, CSCS Pager [phone] Office [phone]  12/23/2014, 10:57 AM

## 2014-12-23 NOTE — Discharge Summary (Signed)
Physician Discharge Summary  Jim Wyatt [name] DOB: [birth year] DOA: 12/18/2014  PCP: [name], MD  Admit date: 12/18/2014 Discharge date: 12/23/2014  Time spent: 35 minutes  Recommendations for Outpatient Follow-up:  1. Suspect patient does not take medications as given 2. Cbc, bmp 1 week 3. ID follow up with Tommy Medal 2 week 4. Final culture from knee aspiration pending 5. 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 male with a history of HIV disease who presented to the ED with complaints of 2 days of fever and chills and increased pain in his right Thigh. A ct scan of the Thigh was performed and revealed a deep abscess in the quadricepts muscle, and General Surgery was consulted and evaluated him and took him to the ED for an I+D. He was placed on IV antibiotic Therapy of Primaxin, Clindamycin and Daptomycin. He was seen and evaluated for admission post operatively and is awake and alert and currently hemodynamically stable. He has some complaints of pain at this time at his surgical site  Hospital Course:  Severe infection with abscess myositis due to GAS:  Fevers defervescing and CCS following Change to PO keflex 500mg  po BID and finish up 2 weeks of postoperative antibiotics WBCs trending down  HIV and  AIDS:  TivicayPREZCOBIX and abacavir, dapsone for PCP, azithromycin for M avium  Dysphagia: will give fluconazole 100mg  daily in case he has esophageal candidasis -14 days treatment-- re-eval in 2 weeks to see if he needs longer -added nystatin  CKD:  - Dr Jonnie Finner  -follow up with Dr. [name] -do not think patient understands to severity of his kidney function -says he has never heard of dialysis  Knee effusion -appears to be swelling above knee -ortho did tap-- no growth on culture   Procedures:    Consultations:  Ortho  Surgery  ID  neprho  Discharge Exam: Filed Vitals:   12/23/14 0632  BP: 161/85  Pulse: 110  Temp: 98.1 F (36.7 C)  Resp: 18    General: awake, NAD-- throat pain stable   Discharge Instructions   Discharge Instructions    Diet general    Complete by:  As directed      Discharge instructions    Complete by:  As directed   Wet to dry dressing changes daily Cbc, BMP 1 week     Increase activity slowly    Complete by:  As directed           Discharge Medication List as of 12/23/2014 11:04 AM    START taking these medications   Details  cephALEXin (KEFLEX) 500 MG capsule Take 1 capsule (500 mg total) by mouth every 12 (twelve) hours., Starting 12/23/2014, Until Discontinued, Print    fluconazole (DIFLUCAN) 100 MG tablet Take 1 tablet (100 mg total) by  mouth daily., Starting 12/23/2014, Until Discontinued, Print    metoprolol tartrate (LOPRESSOR) 25 MG tablet Take 0.5 tablets (12.5 mg total) by mouth 2 (two) times daily., Starting 12/23/2014, Until Discontinued, Print    nystatin (MYCOSTATIN) 100000 UNIT/ML suspension Take 5 mLs (500,000 Units total) by mouth 4 (four) times daily., Starting 12/23/2014, Until Discontinued, Print    oxyCODONE (OXY IR/ROXICODONE) 5 MG immediate release tablet Take 1 tablet (5 mg total) by mouth every 4 (four) hours as needed for moderate pain., Starting 12/23/2014, Until Discontinued, Print       CONTINUE these medications which have NOT CHANGED   Details  abacavir (ZIAGEN) 300 MG tablet Take 2 tablets (600 mg total) by mouth daily., Starting 08/26/2014, Until Discontinued, Normal    acetaminophen (TYLENOL) 500 MG tablet Take 1,000 mg by mouth every 6 (six) hours as needed for moderate pain, fever or headache., Until Discontinued, Historical Med    albuterol (PROVENTIL HFA;VENTOLIN HFA) 108 (90 BASE) MCG/ACT inhaler Inhale 2 puffs into the lungs every 6 (six) hours as needed for wheezing or shortness of breath., Starting 08/02/2014, Until Discontinued, Print    darunavir-cobicistat (PREZCOBIX) 800-150 MG per tablet Take 1 tablet by mouth daily. Swallow whole. Do NOT crush, break or chew tablets. Take with food., Starting 08/26/2014, Until Discontinued, Normal    dolutegravir (TIVICAY) 50 MG tablet Take 1 tablet (50 mg total) by mouth daily., Starting 08/26/2014, Until Discontinued, Normal    amLODipine (NORVASC) 5 MG tablet Take 1 tablet (5 mg total) by mouth daily., Starting 08/02/2014, Until Discontinued, Print    azithromycin (ZITHROMAX) 600 MG tablet Take 2 tablets (1,200 mg total) by mouth once a week., Starting 08/02/2014, Until Discontinued, Print    dapsone 100 MG tablet Take 1 tablet (100 mg total) by mouth daily., Starting 08/02/2014, Until Discontinued, Print    ondansetron (ZOFRAN) 4 MG tablet 30 minutes before her meds as needed for nausea, Normal    sodium bicarbonate 650 MG tablet Take 1 tablet (650 mg total) by mouth 3 (three) times daily., Starting 08/02/2014, Until Discontinued, Print       Allergies  Allergen Reactions  . Bactrim [Sulfamethoxazole-Trimethoprim] Other (See Comments)    Renal failure  . Cauliflower [Brassica Oleracea Italica] Anaphylaxis and Other (See Comments)    Pt states his throat closes up  . Vancomycin Other (See Comments)    Patient states vancomycin caused kidney injury  . Augmentin [Amoxicillin-Pot Clavulanate] Other (See Comments)    unknown   . Banana Other (See Comments)  . Fish Allergy Hives  . Onion Rash   Follow-up Information    Follow up with [facility]   Specialty:  Rehabilitation   Why:  If they do not call you in 2 business days call them    Contact information:   [id number] [place]      Follow up with [name], MD In 1 week.   Specialty:  Internal Medicine   Contact information:   [place] [address] [phone]        The results of significant diagnostics from this hospitalization (including imaging, microbiology, ancillary and laboratory) are listed below for reference.    Significant Diagnostic Studies: Dg Chest 2 View  12/18/2014  CLINICAL DATA:  Shortness breath, fever, tachycardia EXAM: CHEST  2 VIEW COMPARISON:  07/30/2014 FINDINGS: Lungs are clear.  No pleural effusion or pneumothorax. The heart is normal in size. Visualized osseous structures are  within normal limits. IMPRESSION: Normal chest radiographs. Electronically Signed   By: Julian Hy M.D.   On: 12/18/2014 18:37   Dg Lumbar Spine Complete  12/18/2014  CLINICAL DATA:  Status post fall, with injury to the lower back. Initial encounter. EXAM: LUMBAR SPINE - COMPLETE 4+ VIEW COMPARISON:  Lumbar spine radiographs performed 07/30/2014 FINDINGS: There is no evidence of fracture or subluxation. Vertebral bodies demonstrate normal height and alignment. Intervertebral disc spaces are preserved. The visualized neural foramina are grossly unremarkable in appearance. The visualized bowel gas pattern is unremarkable in appearance; air and stool are noted within the colon. The sacroiliac joints are within normal limits. IMPRESSION: No evidence of fracture or subluxation along the lumbar spine. Electronically Signed   By: Garald Balding M.D.   On: 12/18/2014 03:51   Ct Femur Right Wo Contrast  12/18/2014  CLINICAL  DATA:  Acute onset right leg pain for 2 days, status post fall. High fever. Initial encounter. EXAM: CT OF THE RIGHT FEMUR WITHOUT CONTRAST TECHNIQUE: Multidetector CT imaging was performed according to the standard protocol. Multiplanar CT image reconstructions were also generated. COMPARISON:  None. FINDINGS: There is marked diffuse enlargement of the right quadriceps musculature. There is a single focus of focal bulging of the superficial musculature, just below the skin surface, measuring approximately 2.8 x 2.3 cm, approximately 14 cm below the level of the right greater femoral trochanter. This most likely reflects a focal abscess. Additional vague decreased attenuation is seen tracking within the deeper quadriceps musculature, with diffuse intramuscular edema extending medially, and underlying pyomyositis cannot be excluded. Diffuse surrounding soft tissue inflammation is seen. It is difficult to determine how much of this is drainable without contrast. Per clinical correlation, there is no evidence for compartment syndrome. The underlying osseous structures appear grossly intact, with mild chronic deformity involving the proximal right femur. An intramedullary rod and screws appear grossly intact, without evidence of loosening. The right femoral head remains seated at the acetabulum. A trace knee joint effusion is noted, with a few small loose bodies anteriorly. Mild infrapatellar soft tissue swelling is noted. The visualized bowel structures are grossly unremarkable. The bladder is moderately distended and grossly unremarkable in appearance. A small urachal remnant is incidentally noted. Visualized inguinal nodes remain borderline normal in size. The vasculature is not well assessed without contrast. IMPRESSION: 1. Marked diffuse enlargement of the right quadriceps musculature. Single focus of focal bulging of the superficial musculature, just below the skin surface, measuring 2.8 x 2.3 cm, approximately 14  cm below the level of the right greater femoral trochanter at the proximal right thigh. This most likely reflects a focal abscess. Additional vague decreased attenuation within the quadriceps musculature, with diffuse intramuscular edema extending medially. Underlying pyomyositis cannot be excluded. It is difficult to determine how much of this is drainable without contrast. 2. Diffuse surrounding soft tissue inflammation noted about the anterior compartment. No evidence for compartment syndrome, per clinical correlation. 3. No evidence for osteomyelitis. 4. Trace right knee joint effusion, with a few small anterior loose bodies and mild infrapatellar soft tissue swelling. These results were called by telephone at the time of interpretation on 12/18/2014 at 10:15 pm to Dr. Eulis Foster, who verbally acknowledged these results. Electronically Signed   By: Garald Balding M.D.   On: 12/18/2014 22:20   Dg Knee Complete 4 Views Right  12/22/2014  CLINICAL DATA:  Patient with pain and swelling in the right knee. Recent aspiration from the joint. EXAM: RIGHT KNEE - COMPLETE  4+ VIEW COMPARISON:  None. FINDINGS: Distal femoral hardware is demonstrated. Normal anatomic alignment. Mild degenerative changes, predominantly within the lateral compartment. No evidence for acute fracture or dislocation. Small joint effusion. Soft tissue swelling and subcutaneous edema involving the overlying soft tissues. IMPRESSION: Overlying soft tissue swelling and edema. No acute osseous abnormality. Electronically Signed   By: Lovey Newcomer M.D.   On: 12/22/2014 14:13   Dg Femur, Min 2 Views Right  12/18/2014  CLINICAL DATA:  Status post fall, with injury to the right thigh. Initial encounter. EXAM: RIGHT FEMUR 2 VIEWS COMPARISON:  None. FINDINGS: There is no evidence of acute fracture or dislocation. The patient's right femoral intramedullary rod and screws appear intact, with associated mild chronic deformity. The right femoral head remains  seated at the acetabulum. The knee joint is grossly unremarkable in appearance. No significant joint effusion is identified. A small focus of calcification overlying the anterior distal thigh may reflect a small loose body or vascular calcification. No significant soft tissue abnormalities are characterized on radiograph. IMPRESSION: No evidence of acute fracture or dislocation. Visualized hardware appears grossly intact. Electronically Signed   By: Garald Balding M.D.   On: 12/18/2014 03:52    Microbiology: Recent Results (from the past 240 hour(s))  Culture, blood (routine x 2)     Status: None   Collection Time: 12/18/14  5:41 PM  Result Value Ref Range Status   Specimen Description BLOOD RIGHT HAND  Final   Special Requests BOTTLES DRAWN AEROBIC AND ANAEROBIC 5CC  Final   Culture NO GROWTH 5 DAYS  Final   Report Status 12/23/2014 FINAL  Final  Culture, blood (routine x 2)     Status: None   Collection Time: 12/18/14  6:00 PM  Result Value Ref Range Status   Specimen Description BLOOD RIGHT ARM  Final   Special Requests BOTTLES DRAWN AEROBIC AND ANAEROBIC 5CC  Final   Culture NO GROWTH 5 DAYS  Final   Report Status 12/23/2014 FINAL  Final  Urine culture     Status: None   Collection Time: 12/18/14  7:10 PM  Result Value Ref Range Status   Specimen Description URINE, RANDOM  Final   Special Requests NONE  Final   Culture 9,000 COLONIES/mL INSIGNIFICANT GROWTH  Final   Report Status 12/20/2014 FINAL  Final  Anaerobic culture     Status: None   Collection Time: 12/18/14 11:55 PM  Result Value Ref Range Status   Specimen Description WOUND RIGHT THIGH  Final   Special Requests PT ON DAPTOMYOIN,CLINDAMYCIN AND PRIMAXIN  Final   Gram Stain   Final    FEW WBC PRESENT,BOTH PMN AND MONONUCLEAR NO SQUAMOUS EPITHELIAL CELLS SEEN RARE GRAM POSITIVE COCCI IN PAIRS Performed at [facility]    Culture   Final    NO ANAEROBES ISOLATED Performed at [facility]    Report  Status 12/23/2014 FINAL  Final  Gram stain     Status: None   Collection Time: 12/18/14 11:55 PM  Result Value Ref Range Status   Specimen Description WOUND  Final   Special Requests NONE  Final   Gram Stain   Final    MODERATE WBC PRESENT,BOTH PMN AND MONONUCLEAR FEW GRAM POSITIVE COCCI IN PAIRS IN CHAINS Gram Stain Report Called to,Read Back By and Verified With: LOURDIS @0139  12/19/14 MKELLY    Report Status 12/19/2014 FINAL  Final  Culture, routine-abscess     Status: None   Collection Time: 12/18/14 11:55 PM  Result Value Ref Range Status   Specimen Description WOUND RIGHT THIGH  Final   Special Requests PT ON DAPTOMYCIN,CLINDAMYCIN AND PRIMAXIN  Final   Gram Stain   Final    FEW WBC PRESENT,BOTH PMN AND MONONUCLEAR NO SQUAMOUS EPITHELIAL CELLS SEEN RARE GRAM POSITIVE COCCI IN PAIRS Performed at [facility]    Culture   Final    MODERATE GROUP A STREP (S.PYOGENES) ISOLATED Performed at [facility]    Report Status 12/21/2014 FINAL  Final  Body fluid culture     Status: None (Preliminary result)   Collection Time: 12/22/14  1:45 PM  Result Value Ref Range Status   Specimen Description FLUID KNEE RIGHT SYNOVIAL  Final   Special Requests NONE  Final   Gram Stain   Final    RARE WBC PRESENT,BOTH PMN AND MONONUCLEAR NO ORGANISMS SEEN    Culture NO GROWTH < 24 HOURS  Final   Report Status PENDING  Incomplete     Labs: Basic Metabolic Panel:  Recent Labs Lab 12/19/14 0442 12/20/14 0350 12/21/14 0212 12/22/14 1241 12/22/14 2325  NA 132* 129* 132* 130* 128*  K 5.1 4.4 4.6 3.8 3.9  CL 107 107 107 104 100*  CO2 14* 16* 15* 16* 17*  GLUCOSE 94 96 78 102* 107*  BUN 62* 66* 70* 73* 74*  CREATININE 9.56* 9.69* 11.12* 11.88* 12.19*  CALCIUM 8.4* 8.5* 8.6* 8.3* 8.2*  PHOS  --  4.4  --   --   --    Liver Function Tests:  Recent Labs Lab 12/18/14 1741 12/20/14 0350  AST 17  --   ALT 14*  --   ALKPHOS 32*  --   BILITOT 0.8  --   PROT 8.7*   --   ALBUMIN 2.6* 2.0*   No results for input(s): LIPASE, AMYLASE in the last 168 hours. No results for input(s): AMMONIA in the last 168 hours. CBC:  Recent Labs Lab 12/18/14 1741 12/19/14 0442 12/20/14 0350 12/21/14 0212 12/22/14 1241 12/22/14 2325  WBC 21.3* 23.6* 22.4* 25.5* 22.0* 19.7*  NEUTROABS 19.1*  --   --   --   --   --   HGB 8.4* 8.5* 8.0* 7.8* 7.4* 7.3*  HCT 25.0* 25.4* 24.1* 23.7* 22.4* 22.5*  MCV 83.9 83.6 83.1 84.0 83.0 83.3  PLT 127* 114* 122* 171 214 231   Cardiac Enzymes:  Recent Labs Lab 12/18/14 1741  CKTOTAL 440*   BNP: BNP (last 3 results)  Recent Labs  07/30/14 0350  BNP 28.7    ProBNP (last 3 results) No results for input(s): PROBNP in the last 8760 hours.  CBG: No results for input(s): GLUCAP in the last 168 hours.     SignedEulogio Bear  [facility] 12/23/2014, 3:48 PM

## 2014-12-25 LAB — BODY FLUID CULTURE: Culture: NO GROWTH

## 2015-01-11 ENCOUNTER — Telehealth: Payer: Self-pay | Admitting: Infectious Disease

## 2015-01-11 ENCOUNTER — Ambulatory Visit (INDEPENDENT_AMBULATORY_CARE_PROVIDER_SITE_OTHER): Payer: No Typology Code available for payment source | Admitting: Internal Medicine

## 2015-01-11 ENCOUNTER — Encounter: Payer: Self-pay | Admitting: Internal Medicine

## 2015-01-11 VITALS — BP 162/96 | HR 98 | Temp 97.5°F | Wt 176.0 lb

## 2015-01-11 DIAGNOSIS — N189 Chronic kidney disease, unspecified: Secondary | ICD-10-CM

## 2015-01-11 DIAGNOSIS — B20 Human immunodeficiency virus [HIV] disease: Secondary | ICD-10-CM

## 2015-01-11 DIAGNOSIS — D631 Anemia in chronic kidney disease: Secondary | ICD-10-CM

## 2015-01-11 DIAGNOSIS — N184 Chronic kidney disease, stage 4 (severe): Secondary | ICD-10-CM

## 2015-01-11 DIAGNOSIS — M25461 Effusion, right knee: Secondary | ICD-10-CM

## 2015-01-11 LAB — COMPLETE METABOLIC PANEL WITH GFR
ALT: 12 U/L (ref 9–46)
AST: 41 U/L — ABNORMAL HIGH (ref 10–40)
Albumin: 2.9 g/dL — ABNORMAL LOW (ref 3.6–5.1)
Alkaline Phosphatase: 35 U/L — ABNORMAL LOW (ref 40–115)
BUN: 72 mg/dL — ABNORMAL HIGH (ref 7–25)
CO2: 16 mmol/L — ABNORMAL LOW (ref 20–31)
Calcium: 8.3 mg/dL — ABNORMAL LOW (ref 8.6–10.3)
Chloride: 103 mmol/L (ref 98–110)
Creat: 10.77 mg/dL — ABNORMAL HIGH (ref 0.60–1.35)
GFR, Est African American: 7 mL/min — ABNORMAL LOW (ref >=60)
GFR, Est Non African American: 6 mL/min — ABNORMAL LOW (ref >=60)
Glucose, Bld: 86 mg/dL (ref 65–99)
Potassium: 4.7 mmol/L (ref 3.5–5.3)
Sodium: 129 mmol/L — ABNORMAL LOW (ref 135–146)
Total Bilirubin: 0.5 mg/dL (ref 0.2–1.2)
Total Protein: 9.9 g/dL — ABNORMAL HIGH (ref 6.1–8.1)

## 2015-01-11 LAB — CBC WITH DIFFERENTIAL/PLATELET
Basophils Absolute: 0 10*3/uL (ref 0.0–0.1)
Basophils Relative: 1 % (ref 0–1)
Eosinophils Absolute: 0.2 10*3/uL (ref 0.0–0.7)
Eosinophils Relative: 4 % (ref 0–5)
HCT: 17 % — ABNORMAL LOW (ref 39.0–52.0)
Hemoglobin: 5.6 g/dL — CL (ref 13.0–17.0)
Lymphocytes Relative: 38 % (ref 12–46)
Lymphs Abs: 1.6 10*3/uL (ref 0.7–4.0)
MCH: 27.9 pg (ref 26.0–34.0)
MCHC: 32.9 g/dL (ref 30.0–36.0)
MCV: 84.6 fL (ref 78.0–100.0)
MPV: 8.9 fL (ref 8.6–12.4)
Monocytes Absolute: 0.3 10*3/uL (ref 0.1–1.0)
Monocytes Relative: 7 % (ref 3–12)
Neutro Abs: 2.1 10*3/uL (ref 1.7–7.7)
Neutrophils Relative %: 50 % (ref 43–77)
Platelets: 206 10*3/uL (ref 150–400)
RBC: 2.01 MIL/uL — ABNORMAL LOW (ref 4.22–5.81)
RDW: 14.5 % (ref 11.5–15.5)
WBC: 4.1 10*3/uL (ref 4.0–10.5)

## 2015-01-11 NOTE — Telephone Encounter (Signed)
Received critical lab of hemoglobin 5.6  Will call pt advise return for repeat blood draw and blood transfusion in the ED  Left VM to call back re critically low lab  If he does not want to come to ED tonight at minimum should have repeated first thing in am since he meets criteria for blood transfusion

## 2015-01-11 NOTE — Progress Notes (Signed)
Patient ID: Jim Wyatt, male   DOB: [birth year], 34 y.o.   MRN: [id number]       Patient ID: Jim Wyatt, male   DOB: 04/18/1980, 34 y.o.   MRN: [id number]  HPI Ronith is a 34yo M with HIV disease, FSGS with CKD 5, and Cr 12.  CD 4 count of 20/VL 1419 (in June) taking DLG/DRVc/ABC. Recently hospitalized for deep tissue infection/abscess 2/2 GAS. He did have associated right knee effusion where he underwent arthrocentesis roughly day 4 of abtx. Cell count did not suggest septic arthritis @7 ,500 cells with 75% N. The decision was made to discharge on 10/26 with 2 wk of cephalexin. He is here for follow up for hiv disease and his group a strep infection. He has finished his 2 wk course of oral abtx but still has signifcantly swelling of his right knee. He denies any fever, chills, but has occ nightsweats. He feels that his knee is becoming increasingly swollen and painful when weight bearing. He comes in with a cane.  Outpatient Encounter Prescriptions as of 01/11/2015  Medication Sig  . abacavir (ZIAGEN) 300 MG tablet Take 2 tablets (600 mg total) by mouth daily.  [name] acetaminophen (TYLENOL) 500 MG tablet Take 1,000 mg by mouth every 6 (six) hours as needed for moderate pain, fever or headache.  . albuterol (PROVENTIL HFA;VENTOLIN HFA) 108 (90 BASE) MCG/ACT inhaler Inhale 2 puffs into the lungs every 6 (six) hours as needed for wheezing or shortness of breath.  [name] amLODipine (NORVASC) 5 MG tablet Take 1 tablet (5 mg total) by mouth daily.  [name] azithromycin (ZITHROMAX) 600 MG tablet Take 2 tablets (1,200 mg total) by mouth once a week.  . cephALEXin (KEFLEX) 500 MG capsule Take 1 capsule (500 mg total) by mouth every 12 (twelve) hours.  . dapsone 100 MG tablet Take 1 tablet (100 mg total) by mouth daily.  . darunavir-cobicistat (PREZCOBIX) 800-150 MG per tablet Take 1 tablet by mouth daily. Swallow whole. Do NOT crush, break or chew tablets. Take with food.  . dolutegravir (TIVICAY) 50 MG tablet  Take 1 tablet (50 mg total) by mouth daily.  . fluconazole (DIFLUCAN) 100 MG tablet Take 1 tablet (100 mg total) by mouth daily.  . sodium bicarbonate 650 MG tablet Take 1 tablet (650 mg total) by mouth 3 (three) times daily.  . metoprolol tartrate (LOPRESSOR) 25 MG tablet Take 0.5 tablets (12.5 mg total) by mouth 2 (two) times daily. (Patient not taking: Reported on 01/11/2015)  . nystatin (MYCOSTATIN) 100000 UNIT/ML suspension Take 5 mLs (500,000 Units total) by mouth 4 (four) times daily. (Patient not taking: Reported on 01/11/2015)  . ondansetron (ZOFRAN) 4 MG tablet 30 minutes before her meds as needed for nausea (Patient not taking: Reported on 01/11/2015)  . oxyCODONE (OXY IR/ROXICODONE) 5 MG immediate release tablet Take 1 tablet (5 mg total) by mouth every 4 (four) hours as needed for moderate pain. (Patient not taking: Reported on 01/11/2015)   No facility-administered encounter medications on file as of 01/11/2015.     Patient Active Problem List   Diagnosis Date Noted  . Candidal esophagitis ([place])   . Constipation   . Nausea without vomiting 12/20/2014  . Abscess of right thigh 12/19/2014  . Chronic kidney disease (CKD), stage IV (severe) ([place]) 12/19/2014  . 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 Anemia due to end stage renal disease ([place]) 12/19/2014  . Hyponatremia 12/19/2014  . AIDS ([place])   . Community acquired pneumonia 07/30/2014  .  Sepsis ([place]) 07/30/2014  . Metabolic acidosis 123456  . Anemia of chronic disease 07/30/2014  . Thrombocytopenia ([place]) 07/30/2014  . Acute on chronic renal failure ([place])   . 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 Human immunodeficiency virus (HIV) disease ([place]) 12/11/2013  . Hospital discharge follow-up 12/11/2013  . CMV (cytomegalovirus infection) ([place]) 12/07/2013  . Protein-calorie malnutrition, severe ([place]) 12/06/2013  . 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 DVT (deep venous  thrombosis) ([place]) 11/24/2013  . Asthma, chronic 11/24/2013  . Acquired immune deficiency syndrome ([place]) 11/14/2013  . AKI (acute kidney injury) ([place]) 11/11/2013  . Elevated liver enzymes 11/09/2013  . History of shingles 11/09/2013  . Obesity 11/09/2013  . Pneumonia   . Asthma      Health Maintenance Due  Topic Date Due  . TETANUS/TDAP  01/21/2002  . INFLUENZA VACCINE  09/28/2014     Review of Systems + right knee swelling, pain. occ nightsweats. Fatigue. 10 point ros is otherwise negative Physical Exam   BP 162/96 mmHg  Pulse 98  Temp(Src) 97.5 F (36.4 C) (Oral)  Wt 176 lb (79.833 kg) Physical Exam  Constitutional: He is oriented to person, place, and time. He appears well-developed and well-nourished. No distress.  HENT:  Mouth/Throat: Oropharynx is clear and moist. No oropharyngeal exudate.  Ext: right knee has moderate size effusion, warm to touch, no erythema. Full range of motion Lymphadenopathy:  He has no cervical adenopathy.  Skin: Skin is warm and dry. No rash noted. No erythema.  Psychiatric: He has a normal mood and affect. His behavior is normal.    Lab Results  Component Value Date   CD4TCELL 1* 12/19/2014   Lab Results  Component Value Date   CD4TABS 20* 12/19/2014   CD4TABS 60* 07/30/2014   CD4TABS 80* 12/04/2013   Lab Results  Component Value Date   HIV1RNAQUANT 1419* 08/26/2014   No results found for: HEPBSAB No results found for: RPR  CBC Lab Results  Component Value Date   WBC 19.7* 12/22/2014   RBC 2.70* 12/22/2014   HGB 7.3* 12/22/2014   HCT 22.5* 12/22/2014   PLT 231 12/22/2014   MCV 83.3 12/22/2014   MCH 27.0 12/22/2014   MCHC 32.4 12/22/2014   RDW 14.3 12/22/2014   LYMPHSABS 1.3 12/18/2014   MONOABS 0.9 12/18/2014   EOSABS 0.0 12/18/2014   BASOSABS 0.0 12/18/2014   BMET Lab Results  Component Value Date   NA 128* 12/22/2014   K 3.9 12/22/2014   CL 100* 12/22/2014   CO2 17* 12/22/2014   GLUCOSE 107* 12/22/2014    BUN 74* 12/22/2014   CREATININE 12.19* 12/22/2014   CALCIUM 8.2* 12/22/2014   GFRNONAA 5* 12/22/2014   GFRAA 6* 12/22/2014     Assessment and Plan  hiv disease = will continue on current regimen, but need to check labs  ckd 5 = dose adjust his hiv regimen. Recommend he follows up with Dr. [name]. Continues to take bicarb for metabolic acidosis  Right knee effusion = we were able to get [facility] appt for tomorrow at 2:30 to retap knee to see if he has septic arthrtis. His previous arthrocentesis was on 10/25 with cell count of 7,477 75N but he had been  Leukocytosis = will recheck cbc with diff to see that his wbc has recovered  ---------------- Addendum = CBC shows low hgb of 5.8 but unsure if that is correct. Patient is asymptomatic. Clinic staff has asked patient to go to ED  for evaluation and likely blood transfuision

## 2015-01-12 ENCOUNTER — Telehealth: Payer: Self-pay | Admitting: *Deleted

## 2015-01-12 ENCOUNTER — Observation Stay (HOSPITAL_COMMUNITY)
Admission: EM | Admit: 2015-01-12 | Discharge: 2015-01-13 | Disposition: A | Discharge status: Home / Self Care | Payer: No Typology Code available for payment source | Attending: Internal Medicine | Admitting: Internal Medicine

## 2015-01-12 ENCOUNTER — Emergency Department (HOSPITAL_COMMUNITY): Payer: No Typology Code available for payment source

## 2015-01-12 ENCOUNTER — Encounter (HOSPITAL_COMMUNITY): Payer: Self-pay | Admitting: Emergency Medicine

## 2015-01-12 ENCOUNTER — Telehealth: Payer: Self-pay | Admitting: Internal Medicine

## 2015-01-12 DIAGNOSIS — B20 Human immunodeficiency virus [HIV] disease: Secondary | ICD-10-CM | POA: Insufficient documentation

## 2015-01-12 DIAGNOSIS — N184 Chronic kidney disease, stage 4 (severe): Secondary | ICD-10-CM | POA: Insufficient documentation

## 2015-01-12 DIAGNOSIS — D62 Acute posthemorrhagic anemia: Secondary | ICD-10-CM | POA: Diagnosis not present

## 2015-01-12 DIAGNOSIS — D649 Anemia, unspecified: Secondary | ICD-10-CM

## 2015-01-12 DIAGNOSIS — M25461 Effusion, right knee: Secondary | ICD-10-CM

## 2015-01-12 DIAGNOSIS — Z8701 Personal history of pneumonia (recurrent): Secondary | ICD-10-CM | POA: Insufficient documentation

## 2015-01-12 DIAGNOSIS — Z872 Personal history of diseases of the skin and subcutaneous tissue: Secondary | ICD-10-CM | POA: Insufficient documentation

## 2015-01-12 DIAGNOSIS — D631 Anemia in chronic kidney disease: Secondary | ICD-10-CM | POA: Insufficient documentation

## 2015-01-12 DIAGNOSIS — D638 Anemia in other chronic diseases classified elsewhere: Secondary | ICD-10-CM | POA: Diagnosis not present

## 2015-01-12 DIAGNOSIS — Z79899 Other long term (current) drug therapy: Secondary | ICD-10-CM | POA: Insufficient documentation

## 2015-01-12 DIAGNOSIS — Z86718 Personal history of other venous thrombosis and embolism: Secondary | ICD-10-CM | POA: Insufficient documentation

## 2015-01-12 LAB — CBC WITH DIFFERENTIAL/PLATELET
Basophils Absolute: 0 10*3/uL (ref 0.0–0.1)
Basophils Relative: 1 %
Eosinophils Absolute: 0.2 10*3/uL (ref 0.0–0.7)
Eosinophils Relative: 6 %
HCT: 16.1 % — ABNORMAL LOW (ref 39.0–52.0)
Hemoglobin: 5.1 g/dL — CL (ref 13.0–17.0)
Lymphocytes Relative: 43 %
Lymphs Abs: 1.5 10*3/uL (ref 0.7–4.0)
MCH: 26.7 pg (ref 26.0–34.0)
MCHC: 31.7 g/dL (ref 30.0–36.0)
MCV: 84.3 fL (ref 78.0–100.0)
Monocytes Absolute: 0.2 10*3/uL (ref 0.1–1.0)
Monocytes Relative: 7 %
Neutro Abs: 1.5 10*3/uL — ABNORMAL LOW (ref 1.7–7.7)
Neutrophils Relative %: 44 %
Platelets: 198 10*3/uL (ref 150–400)
RBC: 1.91 MIL/uL — ABNORMAL LOW (ref 4.22–5.81)
RDW: 14.9 % (ref 11.5–15.5)
WBC: 3.5 10*3/uL — ABNORMAL LOW (ref 4.0–10.5)

## 2015-01-12 LAB — CBC
HCT: 17.2 % — ABNORMAL LOW (ref 39.0–52.0)
Hemoglobin: 5.6 g/dL — CL (ref 13.0–17.0)
MCH: 27.5 pg (ref 26.0–34.0)
MCHC: 32.6 g/dL (ref 30.0–36.0)
MCV: 84.3 fL (ref 78.0–100.0)
Platelets: 177 10*3/uL (ref 150–400)
RBC: 2.04 MIL/uL — ABNORMAL LOW (ref 4.22–5.81)
RDW: 14.6 % (ref 11.5–15.5)
WBC: 3.5 10*3/uL — ABNORMAL LOW (ref 4.0–10.5)

## 2015-01-12 LAB — ABO/RH: ABO/RH(D): O POS

## 2015-01-12 LAB — BASIC METABOLIC PANEL
Anion gap: 7 (ref 5–15)
BUN: 75 mg/dL — ABNORMAL HIGH (ref 6–20)
CO2: 17 mmol/L — ABNORMAL LOW (ref 22–32)
Calcium: 8.6 mg/dL — ABNORMAL LOW (ref 8.9–10.3)
Chloride: 107 mmol/L (ref 101–111)
Creatinine, Ser: 11.66 mg/dL — ABNORMAL HIGH (ref 0.61–1.24)
GFR calc Af Amer: 6 mL/min — ABNORMAL LOW (ref >60)
GFR calc non Af Amer: 5 mL/min — ABNORMAL LOW (ref >60)
Glucose, Bld: 94 mg/dL (ref 65–99)
Potassium: 4.7 mmol/L (ref 3.5–5.1)
Sodium: 131 mmol/L — ABNORMAL LOW (ref 135–145)

## 2015-01-12 LAB — RETICULOCYTES
RBC.: <1 MIL/uL — ABNORMAL LOW (ref 4.22–5.81)
Retic Ct Pct: 1.7 % (ref 0.4–3.1)

## 2015-01-12 LAB — PREPARE RBC (CROSSMATCH)

## 2015-01-12 LAB — FERRITIN: Ferritin: 484 ng/mL — ABNORMAL HIGH (ref 24–336)

## 2015-01-12 LAB — I-STAT TROPONIN, ED: Troponin i, poc: 0.02 ng/mL (ref 0.00–0.08)

## 2015-01-12 LAB — IRON AND TIBC
Iron: 59 ug/dL (ref 45–182)
Saturation Ratios: 24 % (ref 17.9–39.5)
TIBC: 248 ug/dL — ABNORMAL LOW (ref 250–450)
UIBC: 189 ug/dL

## 2015-01-12 LAB — FOLATE: Folate: 10.6 ng/mL (ref >5.9)

## 2015-01-12 LAB — VITAMIN B12: Vitamin B-12: 1187 pg/mL — ABNORMAL HIGH (ref 180–914)

## 2015-01-12 LAB — RPR

## 2015-01-12 MED ORDER — DOLUTEGRAVIR SODIUM 50 MG PO TABS
50.0000 mg | ORAL_TABLET | Freq: Every day | ORAL | Status: DC
  Administered 2015-01-12: 50 mg via ORAL
  Filled 2015-01-12: qty 1

## 2015-01-12 MED ORDER — HYDRALAZINE HCL 20 MG/ML IJ SOLN: 5.0000 mg | INTRAMUSCULAR | Status: DC | PRN

## 2015-01-12 MED ORDER — DARUNAVIR-COBICISTAT 800-150 MG PO TABS
1.0000 | ORAL_TABLET | Freq: Every day | ORAL | Status: DC
  Administered 2015-01-12: 1 via ORAL
  Filled 2015-01-12 (×2): qty 1

## 2015-01-12 MED ORDER — OXYCODONE HCL 5 MG PO TABS: 5.0000 mg | ORAL_TABLET | ORAL | Status: DC | PRN

## 2015-01-12 MED ORDER — ONDANSETRON HCL 4 MG PO TABS: 4.0000 mg | ORAL_TABLET | Freq: Three times a day (TID) | ORAL | Status: DC | PRN

## 2015-01-12 MED ORDER — SODIUM CHLORIDE 0.9 % IV SOLN
Freq: Once | INTRAVENOUS | Status: AC
Start: 2015-01-13 — End: 2015-01-13
  Administered 2015-01-13: via INTRAVENOUS

## 2015-01-12 MED ORDER — ABACAVIR SULFATE 300 MG PO TABS
600.0000 mg | ORAL_TABLET | Freq: Every day | ORAL | Status: DC
  Administered 2015-01-12: 600 mg via ORAL
  Filled 2015-01-12: qty 2

## 2015-01-12 MED ORDER — ALBUTEROL SULFATE (2.5 MG/3ML) 0.083% IN NEBU: 2.5000 mg | INHALATION_SOLUTION | Freq: Four times a day (QID) | RESPIRATORY_TRACT | Status: DC | PRN

## 2015-01-12 MED ORDER — ACETAMINOPHEN 500 MG PO TABS
1000.0000 mg | ORAL_TABLET | Freq: Four times a day (QID) | ORAL | Status: DC | PRN
  Administered 2015-01-12: 1000 mg via ORAL
  Filled 2015-01-12: qty 2

## 2015-01-12 MED ORDER — DAPSONE 100 MG PO TABS
100.0000 mg | ORAL_TABLET | Freq: Every day | ORAL | Status: DC
  Administered 2015-01-12: 100 mg via ORAL
  Filled 2015-01-12 (×2): qty 1

## 2015-01-12 MED ORDER — SODIUM CHLORIDE 0.9 % IV SOLN
10.0000 mL/h | Freq: Once | INTRAVENOUS | Status: AC
  Administered 2015-01-12: 10 mL/h via INTRAVENOUS

## 2015-01-12 NOTE — ED Notes (Signed)
Patient returned from Xray and placed in the room

## 2015-01-12 NOTE — Telephone Encounter (Signed)
Also left patient a voicemail advising him that physicians have been trying to reach him regarding his critical lab values, advise he follow instructions and report to the ED. [name], RN

## 2015-01-12 NOTE — Progress Notes (Signed)
[facility] notified of hgb 5.6. Pt states he will leave by 7 am if he is not discharge because he has to go to class.

## 2015-01-12 NOTE — ED Notes (Signed)
Pt reports he was sent here by PCP for low hemoglobin drawn yesterday of 5.7. Pt denies having low hgb in the past.

## 2015-01-12 NOTE — Telephone Encounter (Signed)
Mr Jim Wyatt DID call me back shortly after midnight. He is aware of his hgb value and would agree to a blood transfuion  If repeat labs showed his hemoglobin to still be too low. He does not have symptoms--though this level is low enough he should be transfused regardless of symptoms. He plans on going to class this am and then keeping his appt with orthopedics though I told him they would not likely drain his knee if they knew how low his hemoglobin was. He was going to see if they were willing to do it with the low hemoglobing and if not he would come back to get blood checked then, otherwise after his ortho appt. He is going to need a recheck regardless and will need transfusion.  It may get a little tricky given his very poor renal funciotn. May want to transfuse him ONE unit carefullyy then wai and see if he can tolerate a 2nd unit. He is not on HD (refuses this)

## 2015-01-12 NOTE — ED Provider Notes (Signed)
CSN: [id number]     Arrival date & time 01/12/15  1418 History   First MD Initiated Contact with Patient 01/12/15 1536     Chief Complaint  Patient presents with  . low hemoglobin    . Chest Pain     (Consider location/radiation/quality/duration/timing/severity/associated sxs/prior Treatment) Patient is a 34 y.o. male presenting with weakness. The history is provided by the patient.  Weakness This is a new problem. The current episode started more than 2 days ago. The problem occurs constantly. The problem has been gradually worsening. Pertinent negatives include no chest pain, no abdominal pain, no headaches and no shortness of breath. The symptoms are aggravated by exertion. Nothing relieves the symptoms. He has tried nothing for the symptoms.    Past Medical History  Diagnosis Date  . Shingles < 2010  . HIV infection ([place]) dx'd 10/2013  . Cellulitis 11/2013    LLE  . CAP (community acquired pneumonia) 10/2013  . DVT (deep venous thrombosis) ([place]) 10/2013    BLE  . Multiple food allergies   . Multiple environmental allergies     "trees, dogs, cats"  . Chronic kidney disease   . 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 Femur fracture surgery Right 09/2001  . Fracture surgery    . I&d extremity Right 12/18/2014    Procedure: IRRIGATION AND DEBRIDEMENT RIGHT LOWER EXTREMITY;  Surgeon: Ralene Ok, MD;  Location: [facility];  Service: General;  Laterality: Right;   No family history on file. Social History  Substance Use Topics  . Smoking status: Never Smoker   . Smokeless tobacco: Never Used  . Alcohol Use: Yes    Review of Systems  Respiratory: Negative for shortness of breath.   Cardiovascular: Negative for chest pain.  Gastrointestinal: Negative for abdominal pain.  Neurological: Positive for weakness. Negative for headaches.  All other systems reviewed and are negative.     Allergies  Bactrim; Cauliflower;  Vancomycin; Augmentin; Banana; Fish allergy; and Onion  Home Medications   Prior to Admission medications   Medication Sig Start Date End Date Taking? Authorizing Provider  abacavir (ZIAGEN) 300 MG tablet Take 2 tablets (600 mg total) by mouth daily. 08/26/14   Carlyle Basques, MD  acetaminophen (TYLENOL) 500 MG tablet Take 1,000 mg by mouth every 6 (six) hours as needed for moderate pain, fever or headache.    Historical Provider, MD  albuterol (PROVENTIL HFA;VENTOLIN HFA) 108 (90 BASE) MCG/ACT inhaler Inhale 2 puffs into the lungs every 6 (six) hours as needed for wheezing or shortness of breath. 08/02/14   Costin Karlyne Greenspan, MD  amLODipine (NORVASC) 5 MG tablet Take 1 tablet (5 mg total) by mouth daily. 08/02/14   Costin Karlyne Greenspan, MD  azithromycin (ZITHROMAX) 600 MG tablet Take 2 tablets (1,200 mg total) by mouth once a week. 08/02/14   Costin Karlyne Greenspan, MD  cephALEXin (KEFLEX) 500 MG capsule Take 1 capsule (500 mg total) by mouth every 12 (twelve) hours. 12/23/14   Geradine Girt, DO  dapsone 100 MG tablet Take 1 tablet (100 mg total) by mouth daily. 08/02/14   Costin Karlyne Greenspan, MD  darunavir-cobicistat (PREZCOBIX) 800-150 MG per tablet Take 1 tablet by mouth daily. Swallow whole. Do NOT crush, break or chew tablets. Take with food. 08/26/14   Carlyle Basques, MD  dolutegravir (TIVICAY) 50 MG tablet Take 1 tablet (50 mg total) by mouth daily. 08/26/14   Carlyle Basques, MD  fluconazole (DIFLUCAN) 100 MG  tablet Take 1 tablet (100 mg total) by mouth daily. 12/23/14   Geradine Girt, DO  metoprolol tartrate (LOPRESSOR) 25 MG tablet Take 0.5 tablets (12.5 mg total) by mouth 2 (two) times daily. Patient not taking: Reported on 01/11/2015 12/23/14   Geradine Girt, DO  nystatin (MYCOSTATIN) 100000 UNIT/ML suspension Take 5 mLs (500,000 Units total) by mouth 4 (four) times daily. Patient not taking: Reported on 01/11/2015 12/23/14   Geradine Girt, DO  ondansetron (ZOFRAN) 4 MG tablet 30 minutes before her meds  as needed for nausea Patient not taking: Reported on 01/11/2015 10/12/14   Michel Bickers, MD  oxyCODONE (OXY IR/ROXICODONE) 5 MG immediate release tablet Take 1 tablet (5 mg total) by mouth every 4 (four) hours as needed for moderate pain. Patient not taking: Reported on 01/11/2015 12/23/14   Geradine Girt, DO  sodium bicarbonate 650 MG tablet Take 1 tablet (650 mg total) by mouth 3 (three) times daily. 08/02/14   Costin Karlyne Greenspan, MD   BP 158/112 mmHg  Pulse 86  Temp(Src) 97.8 F (36.6 C) (Oral)  Resp 16  Ht 5\' 7"  (1.702 m)  Wt 176 lb (79.833 kg)  BMI 27.56 kg/m2  SpO2 100% Physical Exam  Constitutional: He is oriented to person, place, and time. He appears well-developed and well-nourished. No distress.  HENT:  Head: Normocephalic and atraumatic.  Eyes: Conjunctivae are normal.  Neck: Neck supple. No tracheal deviation present.  Cardiovascular: Normal rate, regular rhythm and normal heart sounds.   Pulmonary/Chest: Effort normal and breath sounds normal. No respiratory distress.  Abdominal: Soft. He exhibits no distension. There is no tenderness.  Musculoskeletal:  Right thigh wound is clean, dry, intact with very scant bleeding after removal of dressing which appears to have dried out.  Neurological: He is alert and oriented to person, place, and time.  Skin: Skin is warm and dry.  Psychiatric: He has a normal mood and affect.    ED Course  Procedures (including critical care time)  CRITICAL CARE Performed by: Leo Grosser Total critical care time: 30 minutes Critical care time was exclusive of separately billable procedures and treating other patients. Critical care was necessary to treat or prevent imminent or life-threatening deterioration. Critical care was time spent personally by me on the following activities: development of treatment plan with patient and/or surrogate as well as nursing, discussions with consultants, evaluation of patient's response to treatment,  examination of patient, obtaining history from patient or surrogate, ordering and performing treatments and interventions, ordering and review of laboratory studies, ordering and review of radiographic studies, pulse oximetry and re-evaluation of patient's condition.   Labs Review Labs Reviewed  BASIC METABOLIC PANEL - Abnormal; Notable for the following:    Sodium 131 (*)    CO2 17 (*)    BUN 75 (*)    Creatinine, Ser 11.66 (*)    Calcium 8.6 (*)    GFR calc non Af Amer 5 (*)    GFR calc Af Amer 6 (*)    All other components within normal limits  CBC WITH DIFFERENTIAL/PLATELET - Abnormal; Notable for the following:    WBC 3.5 (*)    RBC 1.91 (*)    Hemoglobin 5.1 (*)    HCT 16.1 (*)    Neutro Abs 1.5 (*)    All other components within normal limits  VITAMIN B12  FOLATE  IRON AND TIBC  FERRITIN  RETICULOCYTES  I-STAT TROPOININ, ED  TYPE AND SCREEN  ABO/RH  PREPARE  RBC (CROSSMATCH)    Imaging Review Dg Chest 2 View  01/12/2015  CLINICAL DATA:  Right lower chest pain beginning today.  Asthma. EXAM: CHEST  2 VIEW COMPARISON:  12/18/2014 FINDINGS: The heart size and mediastinal contours are within normal limits. Both lungs are clear. The visualized skeletal structures are unremarkable. IMPRESSION: No active cardiopulmonary disease. Electronically Signed   By: Marin Olp M.D.   On: 01/12/2015 15:19   I have personally reviewed and evaluated these images and lab results as part of my medical decision-making.   EKG Interpretation   Date/Time:  Tuesday January 12 2015 14:32:11 EST Ventricular Rate:  89 PR Interval:  184 QRS Duration: 82 QT Interval:  378 QTC Calculation: 459 R Axis:   80 Text Interpretation:  Normal sinus rhythm Normal ECG Confirmed by Addison Whidbee  MD, Quillian Quince [id number]) on 01/12/2015 3:41:15 PM      MDM   Final diagnoses:  Symptomatic anemia  Chronic kidney disease (CKD), stage IV (severe) (HCC)  Acute blood loss anemia   34 year old male with history  of HIV and CKD with chronic anemia and recent admission for abscess of right thigh status post incision and drainage presents with ongoing weakness, seen in routine follow-up by infectious disease yesterday and on screening lab work noted to have a hemoglobin of 5.  Patient has progressively decreasing hemoglobin today, will be provided with urgent PRBC transfusion for symptomatically anemia. Etiology is likely multifactorial with chronic kidney disease suppressing hematopoiesis and chronic low volume bleed from right leg wound which appears clean, dry, and intact today but has small amount of bleeding after removal of bandage. Anemia panel ordered for inpatient follow-up. Will require admission to the hospital for monitoring after transfusion. Hospitalist was consulted for admission and will see the patient in the emergency department.     Leo Grosser, MD 01/13/15 [phone]

## 2015-01-12 NOTE — ED Notes (Signed)
Pt now reports he is also having some chest pain after being told  By PCP he may need a blood transfusion. Rates cp 4/10, across chest. Pt reports he feels anxious.

## 2015-01-12 NOTE — Telephone Encounter (Signed)
Received call from [facility] for critical lab value of Creatinine of 10.77.  Review of the chart shows this is in line with previous readings as he has CKD4.  I also noted that Dr Tommy Medal was notified about his critical lab value of Hgb of 5.6 and left VM message for patient to see ED or follow up in AM for repeat testing.  I also attempted to call to reiterate this but I did not have an answer on his listed number.  Jim Groves, DO

## 2015-01-12 NOTE — Telephone Encounter (Signed)
Thanks Ambre!

## 2015-01-12 NOTE — ED Notes (Signed)
Attempted Report x1.   

## 2015-01-12 NOTE — Telephone Encounter (Signed)
Thanks Cindee Salt that is his baseline creatinine right now. Probably needs to go on HD eventually but he refuses. I am sorry you got called

## 2015-01-12 NOTE — ED Notes (Signed)
Diet Tray Ordered

## 2015-01-12 NOTE — ED Notes (Signed)
Admitting MD at the bedside.  

## 2015-01-12 NOTE — Progress Notes (Signed)
Attempted to get report. Told to call back in 5 minutes.

## 2015-01-12 NOTE — Telephone Encounter (Signed)
RN received referral for [facility] Nurse. Prior to contacting the patient RN reviewed the patient's chart and noted he is currently inpatient. The plan at this time is to visit the patient in the hospital for a face to face encounter to offer additional assistance with medication adherence and management of his HIV.

## 2015-01-12 NOTE — H&P (Signed)
[facility] History and Physical  Jim Wyatt [id number] DOB: 1981/01/01 DOA: 01/12/2015  Referring physician: Emergency Department PCP: [name], MD   CHIEF COMPLAINT:  anemia    HPI: Jim Wyatt is a 34 y.o. male with multiple medical problems not limited to asthma,  HIV / AIDS, CKD stage 4,  DVT in 2015, CAP with sepsis and chronic anemia. Patient was hospitalized late October for right quadricep abscess for which he underwent I&D by Dr. Rosendo Gros. He was treated with primaxin, daptomycin, and clindamycin. Wound culture positive for Group A strep. During same admission he had a right knee effusion which was aspirated but culture negative. Discharged home 12/23/14 with 2 weeks of Keflex. Patient had follow-up appointment yesterday with Infectious Disease who was concerned about right knee swelling.  Patient has been doing wet-to-dry dressings at home daily. There has been some bleeding from the wound the patient describes it as minimal.  No right thigh pain. Overall patient feels the swelling in his right knee has improved since hospital discharge. It is still uncomfortable to walk at times. No fevers or chills. Patient complains of intermittent lower right chest pain . Pain unrelated to coughing or movement, mainly just tender to palpation Patient has no other medical complaints. No blood in stool, no hematemesis, no melena. Bowel movements are normal.   ED COURSE:     1 unit of packed red blood cells ordered and in infusing  Labs:   Sodium 131, BUN 75, creatinine 11.6, albumin 2.9, AST 41, ALT 12 troponin 0.02 WBC 3.5 hemoglobin 5.1 , platelets 190  CXR:   No active disease  EKG:    Normal sinus rhythm. QT interval 459   Medications  0.9 %  sodium chloride infusion (not administered)    Review of Systems  Constitutional: Negative.   HENT: Negative.   Eyes: Negative.   Respiratory: Negative.   Cardiovascular: Negative.   Gastrointestinal: Negative.    Genitourinary: Negative.   Musculoskeletal: Positive for joint pain.  Skin: Negative.   Neurological: Negative.   Endo/Heme/Allergies: Negative.   Psychiatric/Behavioral: Negative.     Past Medical History  Diagnosis Date  . Shingles < 2010  . HIV infection ([place]) dx'd 10/2013  . Cellulitis 11/2013    LLE  . CAP (community acquired pneumonia) 10/2013  . DVT (deep venous thrombosis) ([place]) 10/2013    BLE  . Multiple food allergies   . Multiple environmental allergies     "trees, dogs, cats"  . Chronic kidney disease   . 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 Femur fracture surgery Right 09/2001  . Fracture surgery    . I&d extremity Right 12/18/2014    Procedure: IRRIGATION AND DEBRIDEMENT RIGHT LOWER EXTREMITY;  Surgeon: Ralene Ok, MD;  Location: [facility];  Service: General;  Laterality: Right;    SOCIAL HISTORY:  reports that he has never smoked. He has never used smokeless tobacco. He reports that he drinks alcohol. He reports that he does not use illicit drugs. Lives:   With roommate at home   Assistive devices:   None needed for ambulation.   Allergies  Allergen Reactions  . Bactrim [Sulfamethoxazole-Trimethoprim] Other (See Comments)    Renal failure  . Cauliflower [Brassica Oleracea Italica] Anaphylaxis and Other (See Comments)    Pt states his throat closes up  . Vancomycin Other (See Comments)    Patient states vancomycin caused kidney injury  . Augmentin [Amoxicillin-Pot Clavulanate] Other (See  Comments)    unknown  . Banana Other (See Comments)  . Fish Allergy Hives  . Onion Rash   [place]: Maternal Grandmother had diabetes and kidney disease   Prior to Admission medications   Medication Sig Start Date End Date Taking? Authorizing Provider  abacavir (ZIAGEN) 300 MG tablet Take 2 tablets (600 mg total) by mouth daily. 08/26/14   Carlyle Basques, MD  acetaminophen (TYLENOL) 500 MG tablet Take 1,000 mg by  mouth every 6 (six) hours as needed for moderate pain, fever or headache.    Historical Provider, MD  albuterol (PROVENTIL HFA;VENTOLIN HFA) 108 (90 BASE) MCG/ACT inhaler Inhale 2 puffs into the lungs every 6 (six) hours as needed for wheezing or shortness of breath. 08/02/14   Costin Karlyne Greenspan, MD  amLODipine (NORVASC) 5 MG tablet Take 1 tablet (5 mg total) by mouth daily. 08/02/14   Costin Karlyne Greenspan, MD  azithromycin (ZITHROMAX) 600 MG tablet Take 2 tablets (1,200 mg total) by mouth once a week. 08/02/14   Costin Karlyne Greenspan, MD  cephALEXin (KEFLEX) 500 MG capsule Take 1 capsule (500 mg total) by mouth every 12 (twelve) hours. 12/23/14   Geradine Girt, DO  dapsone 100 MG tablet Take 1 tablet (100 mg total) by mouth daily. 08/02/14   Costin Karlyne Greenspan, MD  darunavir-cobicistat (PREZCOBIX) 800-150 MG per tablet Take 1 tablet by mouth daily. Swallow whole. Do NOT crush, break or chew tablets. Take with food. 08/26/14   Carlyle Basques, MD  dolutegravir (TIVICAY) 50 MG tablet Take 1 tablet (50 mg total) by mouth daily. 08/26/14   Carlyle Basques, MD  fluconazole (DIFLUCAN) 100 MG tablet Take 1 tablet (100 mg total) by mouth daily. 12/23/14   Geradine Girt, DO  metoprolol tartrate (LOPRESSOR) 25 MG tablet Take 0.5 tablets (12.5 mg total) by mouth 2 (two) times daily. Patient not taking: Reported on 01/11/2015 12/23/14   Geradine Girt, DO  nystatin (MYCOSTATIN) 100000 UNIT/ML suspension Take 5 mLs (500,000 Units total) by mouth 4 (four) times daily. Patient not taking: Reported on 01/11/2015 12/23/14   Geradine Girt, DO  ondansetron (ZOFRAN) 4 MG tablet 30 minutes before her meds as needed for nausea Patient not taking: Reported on 01/11/2015 10/12/14   Michel Bickers, MD  oxyCODONE (OXY IR/ROXICODONE) 5 MG immediate release tablet Take 1 tablet (5 mg total) by mouth every 4 (four) hours as needed for moderate pain. Patient not taking: Reported on 01/11/2015 12/23/14   Geradine Girt, DO  sodium bicarbonate 650 MG  tablet Take 1 tablet (650 mg total) by mouth 3 (three) times daily. 08/02/14   Costin Karlyne Greenspan, MD   PHYSICAL EXAM: Filed Vitals:   01/12/15 1425 01/12/15 1537 01/12/15 1600  BP: 166/101 158/112 149/101  Pulse: 95 86 90  Temp: 97.8 F (36.6 C)    TempSrc: Oral    Resp: 18 16 18   Height: 5\' 7"  (1.702 m)    Weight: 79.833 kg (176 lb)    SpO2: 100% 100% 100%    Wt Readings from Last 3 Encounters:  01/12/15 79.833 kg (176 lb)  01/11/15 79.833 kg (176 lb)  12/19/14 87.4 kg (192 lb 10.9 oz)    General:  Pleasant black male. Appears calm and comfortable Eyes: PER, normal lids, irises & conjunctiva ENT: grossly normal hearing, lips & tongue Neck: no LAD, no masses Cardiovascular: RRR, no murmurs. No LE edema.  Respiratory: Respirations even and unlabored. Normal respiratory effort. Lungs CTA bilaterally, no wheezes / rales .  Abdomen: soft, non-distended, non-tender, active bowel sounds. No obvious masses.  Skin: no rash seen on limited exam Right LE: Approx 5 inch vertical wound open with pink tissue, no purulent drainage or odor. Scant blood on fresh dressing. Right knee edematous but nontender.  Musculoskeletal: grossly normal tone BUE/BLE. Localized tenderness right of lower sternum Psychiatric: grossly normal mood and affect, speech fluent and appropriate Neurologic: grossly non-focal.         LABS ON ADMISSION:    Basic Metabolic Panel:  Recent Labs Lab 01/11/15 1651 01/12/15 1450  NA 129* 131*  K 4.7 4.7  CL 103 107  CO2 16* 17*  GLUCOSE 86 94  BUN 72* 75*  CREATININE 10.77* 11.66*  CALCIUM 8.3* 8.6*   Liver Function Tests:  Recent Labs Lab 01/11/15 1651  AST 41*  ALT 12  ALKPHOS 35*  BILITOT 0.5  PROT 9.9*  ALBUMIN 2.9*    CBC:  Recent Labs Lab 01/11/15 1651 01/12/15 1450  WBC 4.1 3.5*  NEUTROABS 2.1 1.5*  HGB 5.6* 5.1*  HCT 17.0* 16.1*  MCV 84.6 84.3  PLT 206 198    Recent Labs  07/30/14 0350  BNP 28.7   CREATININE: 11.66 mg/dL  ABNORMAL (01/12/15 1450) Estimated creatinine clearance - 9.3 mL/min  Radiological Exams on Admission: Dg Chest 2 View  01/12/2015  CLINICAL DATA:  Right lower chest pain beginning today.  Asthma. EXAM: CHEST  2 VIEW COMPARISON:  12/18/2014 FINDINGS: The heart size and mediastinal contours are within normal limits. Both lungs are clear. The visualized skeletal structures are unremarkable. IMPRESSION: No active cardiopulmonary disease. Electronically Signed   By: Marin Olp M.D.   On: 01/12/2015 15:19    ASSESSMENT / PLAN  Anemia of chronic disease with acute drop in Hgb. Baseline hemoglobin is 7-8, down to 5.6 yesterday and 5.1 today. Patient describes only scant blood loss with right thigh dressing changes. Surprising that hemoglobin would have fallen 2 g in 3 weeks based on minimal blood loss patient is describing but no other overt bleeding.  -Admit to observation, Medical bed -1 unit of red blood cells currently being transfused now -follow up CBC this evening  Right thigh wound. He had I&D of right thigh abscess late last month.Treated with IV antibiotics followed by 2 weeks of Keflex. Doing daily wet to dry dressings at home.  Wound looks clean -Consult General Surgery to evaluate wound. Healing as expected?  Right knee pain / swelling.  Knee aspirated last month, negative culture. Patient feels swelling overall better but still uncomfortable to walk.  -consider Orthopedic consult in am for persistent right knee pain / swelling.   HIV / AIDS. Followed by Infectious Disease. Patient states he is compliant with medications. CD4 abs 20 with 1% Helper T cells on 12/19/14 -Continue home HIV meds.   CKD. Creatinine 11-12 which is about baseline. Patient evaluated by nephrology during last month's admission. 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 Patient states he is still thinking about dialysis but has not decided.   History of  lower extremity DVT. No pharmacologic prophylaxis for now given active bleeding  CONSULTANTS:    General Surgery  Code Status: fulll code  DVT Prophylaxis: SCDs and ambulation Q shift  Family Communication:   Patient alert, oriented and understands plan of care.  Disposition Plan: Discharge to home 24-48 hours   Time spent: 60 minutes Tye Savoy  NP [facility] Pager [phone]

## 2015-01-13 ENCOUNTER — Ambulatory Visit: Payer: Self-pay | Admitting: *Deleted

## 2015-01-13 DIAGNOSIS — B20 Human immunodeficiency virus [HIV] disease: Secondary | ICD-10-CM

## 2015-01-13 LAB — BASIC METABOLIC PANEL
Anion gap: 10 (ref 5–15)
BUN: 75 mg/dL — ABNORMAL HIGH (ref 6–20)
CO2: 15 mmol/L — ABNORMAL LOW (ref 22–32)
Calcium: 8.6 mg/dL — ABNORMAL LOW (ref 8.9–10.3)
Chloride: 108 mmol/L (ref 101–111)
Creatinine, Ser: 11.57 mg/dL — ABNORMAL HIGH (ref 0.61–1.24)
GFR calc Af Amer: 6 mL/min — ABNORMAL LOW (ref >60)
GFR calc non Af Amer: 5 mL/min — ABNORMAL LOW (ref >60)
Glucose, Bld: 89 mg/dL (ref 65–99)
Potassium: 4.6 mmol/L (ref 3.5–5.1)
Sodium: 133 mmol/L — ABNORMAL LOW (ref 135–145)

## 2015-01-13 LAB — T-HELPER CELL (CD4) - (RCID CLINIC ONLY)
CD4 % Helper T Cell: 4 % — ABNORMAL LOW (ref 33–55)
CD4 T Cell Abs: 50 /uL — ABNORMAL LOW (ref 400–2700)

## 2015-01-13 LAB — CBC
HCT: 20.7 % — ABNORMAL LOW (ref 39.0–52.0)
Hemoglobin: 7 g/dL — ABNORMAL LOW (ref 13.0–17.0)
MCH: 28.5 pg (ref 26.0–34.0)
MCHC: 33.8 g/dL (ref 30.0–36.0)
MCV: 84.1 fL (ref 78.0–100.0)
Platelets: 185 10*3/uL (ref 150–400)
RBC: 2.46 MIL/uL — ABNORMAL LOW (ref 4.22–5.81)
RDW: 14.4 % (ref 11.5–15.5)
WBC: 3.9 10*3/uL — ABNORMAL LOW (ref 4.0–10.5)

## 2015-01-13 LAB — HIV-1 RNA QUANT-NO REFLEX-BLD
HIV 1 RNA Quant: 114123 copies/mL — ABNORMAL HIGH (ref <20)
HIV-1 RNA Quant, Log: 5.06 Log copies/mL — ABNORMAL HIGH (ref <1.30)

## 2015-01-13 NOTE — Progress Notes (Signed)
Pt has signed out AMA. He verbalized understanding and consequences of leaving hospital against medical advice.

## 2015-01-13 NOTE — Progress Notes (Signed)
Midlevel informed of pt leaving AMA.

## 2015-01-14 LAB — TYPE AND SCREEN
ABO/RH(D): O POS
Antibody Screen: NEGATIVE
Unit division: 0
Unit division: 0

## 2015-01-15 ENCOUNTER — Telehealth: Payer: Self-pay | Admitting: *Deleted

## 2015-01-15 NOTE — Progress Notes (Signed)
Patient ID: Jim Wyatt, male   DOB: 11/23/1980, 34 y.o.   MRN: [id number] RN received communication for Dr Tommy Medal stating he would like for me to meet the patient in the hospital to offer [facility]. RN arrived at 9am this morning and upon reviewing the chart noted that the patient signed himself out the hospital against medical advice before 7am this morning. RN informed Dr Tommy Medal of the findings as well.

## 2015-01-15 NOTE — Telephone Encounter (Signed)
Patient called stating he went to the hospital on Dr. Lucianne Lei Dam's advise due to a critical hemoglobin of 5.6. He noticed that it is now 7.0 and wanted to know if he needed to go back. Talked to Dr. Tommy Medal and he said that 7 is ok; informed patient. Myrtis Hopping

## 2015-01-28 DIAGNOSIS — N186 End stage renal disease: Secondary | ICD-10-CM

## 2015-01-28 DIAGNOSIS — Z992 Dependence on renal dialysis: Secondary | ICD-10-CM

## 2015-01-28 HISTORY — DX: End stage renal disease: N18.6

## 2015-01-28 HISTORY — DX: End stage renal disease: Z99.2

## 2015-02-02 ENCOUNTER — Other Ambulatory Visit: Payer: Self-pay | Admitting: *Deleted

## 2015-02-02 DIAGNOSIS — N185 Chronic kidney disease, stage 5: Secondary | ICD-10-CM

## 2015-02-02 DIAGNOSIS — Z01810 Encounter for preprocedural cardiovascular examination: Secondary | ICD-10-CM

## 2015-02-08 ENCOUNTER — Emergency Department (HOSPITAL_COMMUNITY)
Admission: EM | Admit: 2015-02-08 | Discharge: 2015-02-09 | Disposition: A | Discharge status: Home / Self Care | Payer: No Typology Code available for payment source | Attending: Emergency Medicine | Admitting: Emergency Medicine

## 2015-02-08 ENCOUNTER — Encounter (HOSPITAL_COMMUNITY): Payer: Self-pay | Admitting: Family Medicine

## 2015-02-08 DIAGNOSIS — Z86718 Personal history of other venous thrombosis and embolism: Secondary | ICD-10-CM | POA: Insufficient documentation

## 2015-02-08 DIAGNOSIS — Z872 Personal history of diseases of the skin and subcutaneous tissue: Secondary | ICD-10-CM | POA: Diagnosis not present

## 2015-02-08 DIAGNOSIS — Z8619 Personal history of other infectious and parasitic diseases: Secondary | ICD-10-CM | POA: Insufficient documentation

## 2015-02-08 DIAGNOSIS — Z21 Asymptomatic human immunodeficiency virus [HIV] infection status: Secondary | ICD-10-CM | POA: Diagnosis not present

## 2015-02-08 DIAGNOSIS — I129 Hypertensive chronic kidney disease with stage 1 through stage 4 chronic kidney disease, or unspecified chronic kidney disease: Secondary | ICD-10-CM | POA: Insufficient documentation

## 2015-02-08 DIAGNOSIS — N189 Chronic kidney disease, unspecified: Secondary | ICD-10-CM | POA: Diagnosis not present

## 2015-02-08 DIAGNOSIS — Z79899 Other long term (current) drug therapy: Secondary | ICD-10-CM | POA: Diagnosis not present

## 2015-02-08 DIAGNOSIS — D649 Anemia, unspecified: Secondary | ICD-10-CM | POA: Insufficient documentation

## 2015-02-08 DIAGNOSIS — Z8701 Personal history of pneumonia (recurrent): Secondary | ICD-10-CM | POA: Diagnosis not present

## 2015-02-08 DIAGNOSIS — R718 Other abnormality of red blood cells: Secondary | ICD-10-CM | POA: Diagnosis present

## 2015-02-08 HISTORY — DX: Essential (primary) hypertension: I10

## 2015-02-08 LAB — COMPREHENSIVE METABOLIC PANEL
ALT: 12 U/L — ABNORMAL LOW (ref 17–63)
AST: 15 U/L (ref 15–41)
Albumin: 2.7 g/dL — ABNORMAL LOW (ref 3.5–5.0)
Alkaline Phosphatase: 34 U/L — ABNORMAL LOW (ref 38–126)
Anion gap: 9 (ref 5–15)
BUN: 73 mg/dL — ABNORMAL HIGH (ref 6–20)
CO2: 19 mmol/L — ABNORMAL LOW (ref 22–32)
Calcium: 8.6 mg/dL — ABNORMAL LOW (ref 8.9–10.3)
Chloride: 105 mmol/L (ref 101–111)
Creatinine, Ser: 10.71 mg/dL — ABNORMAL HIGH (ref 0.61–1.24)
GFR calc Af Amer: 6 mL/min — ABNORMAL LOW (ref >60)
GFR calc non Af Amer: 6 mL/min — ABNORMAL LOW (ref >60)
Glucose, Bld: 118 mg/dL — ABNORMAL HIGH (ref 65–99)
Potassium: 4.6 mmol/L (ref 3.5–5.1)
Sodium: 133 mmol/L — ABNORMAL LOW (ref 135–145)
Total Bilirubin: 0.6 mg/dL (ref 0.3–1.2)
Total Protein: 9.2 g/dL — ABNORMAL HIGH (ref 6.5–8.1)

## 2015-02-08 LAB — CBC
HCT: 19 % — ABNORMAL LOW (ref 39.0–52.0)
Hemoglobin: 6.2 g/dL — CL (ref 13.0–17.0)
MCH: 28.7 pg (ref 26.0–34.0)
MCHC: 32.6 g/dL (ref 30.0–36.0)
MCV: 88 fL (ref 78.0–100.0)
Platelets: 172 10*3/uL (ref 150–400)
RBC: 2.16 MIL/uL — ABNORMAL LOW (ref 4.22–5.81)
RDW: 14.5 % (ref 11.5–15.5)
WBC: 4.6 10*3/uL (ref 4.0–10.5)

## 2015-02-08 LAB — TYPE AND SCREEN
ABO/RH(D): O POS
Antibody Screen: NEGATIVE

## 2015-02-08 NOTE — ED Notes (Signed)
Pt sent here by his doctor for low hgb. Denies any weakness, SOB.

## 2015-02-08 NOTE — ED Provider Notes (Signed)
CSN: [id number]     Arrival date & time 02/08/15  1728 History   First MD Initiated Contact with Patient 02/08/15 2117     Chief Complaint  Patient presents with  . low hemo      (Consider location/radiation/quality/duration/timing/severity/associated sxs/prior Treatment) HPI   Jim Wyatt is a(n) 34 y.o. male who presents for anemia. He has a pmh of HIV (poorly controlled with last CD4 count at 50 and Viral load >11000).  Patient was seen by his nephrologist today. He got a call from his nephrologist telling him to go to the emergency department for blood transfusion. The patient denies chest pain, shortness of breath, feelings of presyncope. He has chronic anemia. He denies any recent blood loss.Denies fevers, chills, myalgias, arthralgias. Denies DOE, SOB, chest tightness or pressure, radiation to left arm, jaw or back, or diaphoresis. Denies dysuria, flank pain, suprapubic pain, frequency, urgency, or hematuria. Denies headaches, light headedness, weakness, visual disturbances. Denies abdominal pain, nausea, vomiting, diarrhea or constipation.     Past Medical History  Diagnosis Date  . Shingles < 2010  . HIV infection ([place]) dx'd 10/2013  . Cellulitis 11/2013    LLE  . CAP (community acquired pneumonia) 10/2013  . DVT (deep venous thrombosis) ([place]) 10/2013    BLE  . Multiple food allergies   . Multiple environmental allergies     "trees, dogs, cats"  . Chronic kidney disease   . 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 Hypertension    Past Surgical History  Procedure Laterality Date  . Femur fracture surgery Right 09/2001  . Fracture surgery    . I&d extremity Right 12/18/2014    Procedure: IRRIGATION AND DEBRIDEMENT RIGHT LOWER EXTREMITY;  Surgeon: Ralene Ok, MD;  Location: [facility];  Service: General;  Laterality: Right;   Family History  Problem Relation Age of Onset  . Diabetes Maternal Grandmother   . Kidney disease Maternal Grandmother    Social History   Substance Use Topics  . Smoking status: Never Smoker   . Smokeless tobacco: Never Used  . Alcohol Use: Yes    Review of Systems  Ten systems reviewed and are negative for acute change, except as noted in the HPI.    Allergies  Bactrim; Cauliflower; Vancomycin; Amlodipine; Augmentin; Banana; Fish allergy; and Onion  Home Medications   Prior to Admission medications   Medication Sig Start Date End Date Taking? Authorizing Provider  abacavir (ZIAGEN) 300 MG tablet Take 2 tablets (600 mg total) by mouth daily. 08/26/14  Yes Carlyle Basques, MD  carvedilol (COREG) 12.5 MG tablet Take 12.5 mg by mouth 2 (two) times daily. 01/28/15  Yes Historical Provider, MD  dapsone 100 MG tablet Take 1 tablet (100 mg total) by mouth daily. 08/02/14  Yes Costin Karlyne Greenspan, MD  darunavir-cobicistat (PREZCOBIX) 800-150 MG per tablet Take 1 tablet by mouth daily. Swallow whole. Do NOT crush, break or chew tablets. Take with food. 08/26/14  Yes Carlyle Basques, MD  dolutegravir (TIVICAY) 50 MG tablet Take 1 tablet (50 mg total) by mouth daily. 08/26/14  Yes Carlyle Basques, MD  albuterol (PROVENTIL HFA;VENTOLIN HFA) 108 (90 BASE) MCG/ACT inhaler Inhale 2 puffs into the lungs every 6 (six) hours as needed for wheezing or shortness of breath. 08/02/14   Costin Karlyne Greenspan, MD  ondansetron (ZOFRAN) 4 MG tablet 30 minutes before her meds as needed for nausea Patient taking differently: Take 4 mg by mouth every 8 (eight) hours as needed for nausea or vomiting. 30 minutes before  her meds as needed for nausea 10/12/14   Michel Bickers, MD  oxyCODONE (OXY IR/ROXICODONE) 5 MG immediate release tablet Take 1 tablet (5 mg total) by mouth every 4 (four) hours as needed for moderate pain. 12/23/14   Jessica Upchurch Vann, DO   BP 173/94 mmHg  Pulse 97  Temp(Src) 99.3 F (37.4 C) (Oral)  Resp 18  SpO2 100% Physical Exam Physical Exam  Nursing note and vitals reviewed. Constitutional: He appears well-developed and well-nourished. No  distress.  HENT:  Head: Normocephalic and atraumatic.  Eyes: Conjunctivae normal are normal. No scleral icterus.  Neck: Normal range of motion. Neck supple.  Cardiovascular: Normal rate, regular rhythm and normal heart sounds.   Pulmonary/Chest: Effort normal and breath sounds normal. No respiratory distress.  Abdominal: Soft. There is no tenderness.  Musculoskeletal: He exhibits no edema.  Neurological: He is alert.  Skin: Skin is warm and dry. He is not diaphoretic.  Psychiatric: His behavior is normal.    ED Course  Procedures (including critical care time) Labs Review Labs Reviewed  COMPREHENSIVE METABOLIC PANEL - Abnormal; Notable for the following:    Sodium 133 (*)    CO2 19 (*)    Glucose, Bld 118 (*)    BUN 73 (*)    Creatinine, Ser 10.71 (*)    Calcium 8.6 (*)    Total Protein 9.2 (*)    Albumin 2.7 (*)    ALT 12 (*)    Alkaline Phosphatase 34 (*)    GFR calc non Af Amer 6 (*)    GFR calc Af Amer 6 (*)    All other components within normal limits  CBC - Abnormal; Notable for the following:    RBC 2.16 (*)    Hemoglobin 6.2 (*)    HCT 19.0 (*)    All other components within normal limits  POC OCCULT BLOOD, ED  TYPE AND SCREEN    Imaging Review No results found. I have personally reviewed and evaluated these images and lab results as part of my medical decision-making.   EKG Interpretation None      MDM   Final diagnoses:  Anemia, unspecified anemia type    Patient with negative orthostatics. I have discussed the case with Dr. Rex Kras. We feel that given his high viral load and low CD4 count, a blood transfusion is not without risk of acquired infection such as cytomegalovirus.  I spoke with Dr. Jimmy Footman who is on call for nephrology. I discussed the case with him and provided his last 3. Hemoglobin switch of all been in the range of 5-7. Today, the patient's hemoglobin is 6.2. He is asymptomatic. I do not feel that blood transfusion is necessary, Dr.  Jimmy Footman agrees. Patient is in agreement with plan of care as well. He will be discharged. Asked him to follow-up in the next 2 days for recheck of his hemoglobin to make sure it is not trending downward. Patient agrees. He appears safe for discharge at this time. Discussed return precautions.    Margarita Mail, PA-C 02/09/15 [place], MD 02/09/15 [phone]

## 2015-02-08 NOTE — Discharge Instructions (Signed)
Anemia, Nonspecific Anemia is a condition in which the concentration of red blood cells or hemoglobin in the blood is below normal. Hemoglobin is a substance in red blood cells that carries oxygen to the tissues of the body. Anemia results in not enough oxygen reaching these tissues.  CAUSES  Common causes of anemia include:   Excessive bleeding. Bleeding may be internal or external. This includes excessive bleeding from periods (in women) or from the intestine.   Poor nutrition.   Chronic kidney, thyroid, and liver disease.  Bone marrow disorders that decrease red blood cell production.  Cancer and treatments for cancer.  HIV, AIDS, and their treatments.  Spleen problems that increase red blood cell destruction.  Blood disorders.  Excess destruction of red blood cells due to infection, medicines, and autoimmune disorders. SIGNS AND SYMPTOMS   Minor weakness.   Dizziness.   Headache.  Palpitations.   Shortness of breath, especially with exercise.   Paleness.  Cold sensitivity.  Indigestion.  Nausea.  Difficulty sleeping.  Difficulty concentrating. Symptoms may occur suddenly or they may develop slowly.  DIAGNOSIS  Additional blood tests are often needed. These help your health care provider determine the best treatment. Your health care provider will check your stool for blood and look for other causes of blood loss.  TREATMENT  Treatment varies depending on the cause of the anemia. Treatment can include:   Supplements of iron, vitamin B12, or folic acid.   Hormone medicines.   A blood transfusion. This may be needed if blood loss is severe.   Hospitalization. This may be needed if there is significant continual blood loss.   Dietary changes.  Spleen removal. HOME CARE INSTRUCTIONS Keep all follow-up appointments. It often takes many weeks to correct anemia, and having your health care provider check on your condition and your response to  treatment is very important. SEEK IMMEDIATE MEDICAL CARE IF:   You develop extreme weakness, shortness of breath, or chest pain.   You become dizzy or have trouble concentrating.  You develop heavy vaginal bleeding.   You develop a rash.   You have bloody or black, tarry stools.   You faint.   You vomit up blood.   You vomit repeatedly.   You have abdominal pain.  You have a fever or persistent symptoms for more than 2-3 days.   You have a fever and your symptoms suddenly get worse.   You are dehydrated.  MAKE SURE YOU:  Understand these instructions.  Will watch your condition.  Will get help right away if you are not doing well or get worse.   This information is not intended to replace advice given to you by your health care provider. Make sure you discuss any questions you have with your health care provider.   Document Released: 03/23/2004 Document Revised: 10/16/2012 Document Reviewed: 08/09/2012 Elsevier Interactive Patient Education 2016 [employer]  

## 2015-02-08 NOTE — ED Notes (Signed)
Pt states he came from doctor for low Hbg, no c/o SOB or dizziness. Pt has had blood transfusion before, denies blood in stool, reports bleeding gums recently

## 2015-02-08 NOTE — Discharge Summary (Signed)
Patient left AMA at 6:42am the morning after admission,  prior to being seen by daytime rounding MD.

## 2015-02-08 NOTE — ED Notes (Signed)
Hgb 6.2 from lab.

## 2015-02-08 NOTE — ED Notes (Signed)
Call from

## 2015-02-10 ENCOUNTER — Encounter: Payer: Self-pay | Admitting: Vascular Surgery

## 2015-02-16 ENCOUNTER — Other Ambulatory Visit: Payer: Self-pay

## 2015-02-16 ENCOUNTER — Ambulatory Visit (INDEPENDENT_AMBULATORY_CARE_PROVIDER_SITE_OTHER): Payer: PRIVATE HEALTH INSURANCE | Admitting: Vascular Surgery

## 2015-02-16 ENCOUNTER — Ambulatory Visit (INDEPENDENT_AMBULATORY_CARE_PROVIDER_SITE_OTHER)
Admission: RE | Admit: 2015-02-16 | Discharge: 2015-02-16 | Disposition: A | Discharge status: Home / Self Care | Payer: PRIVATE HEALTH INSURANCE | Source: Ambulatory Visit | Attending: Vascular Surgery | Admitting: Vascular Surgery

## 2015-02-16 ENCOUNTER — Encounter: Payer: Self-pay | Admitting: Vascular Surgery

## 2015-02-16 ENCOUNTER — Ambulatory Visit (HOSPITAL_COMMUNITY)
Admission: RE | Admit: 2015-02-16 | Discharge: 2015-02-16 | Disposition: A | Discharge status: Home / Self Care | Payer: PRIVATE HEALTH INSURANCE | Source: Ambulatory Visit | Attending: Vascular Surgery | Admitting: Vascular Surgery

## 2015-02-16 VITALS — BP 157/98 | HR 95 | Temp 98.2°F | Resp 16 | Ht 67.5 in | Wt 172.6 lb

## 2015-02-16 DIAGNOSIS — Z01810 Encounter for preprocedural cardiovascular examination: Secondary | ICD-10-CM | POA: Diagnosis not present

## 2015-02-16 DIAGNOSIS — N186 End stage renal disease: Secondary | ICD-10-CM | POA: Diagnosis not present

## 2015-02-16 DIAGNOSIS — N185 Chronic kidney disease, stage 5: Secondary | ICD-10-CM | POA: Diagnosis not present

## 2015-02-16 NOTE — Progress Notes (Signed)
Subjective:     Patient ID: Jim Wyatt, male   DOB: Mar 10, 1980, 34 y.o.   MRN: [id number]  HPI this 34 year old male has end-stage renal disease and was referred by Dr. [name] for evaluation of vascular access. Patient is right-handed. He is HIV positive. His dialysis began last week through a catheter in the right IJ. He does not know the exact etiology of his kidney failure.   Past Medical History  Diagnosis Date  . Shingles < 2010  . HIV infection ([place]) dx'd 10/2013  . Cellulitis 11/2013    LLE  . CAP (community acquired pneumonia) 10/2013  . DVT (deep venous thrombosis) ([place]) 10/2013    BLE  . Multiple food allergies   . Multiple environmental allergies     "trees, dogs, cats"  . Chronic kidney disease   . 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 Hypertension     Social History  Substance Use Topics  . Smoking status: Never Smoker   . Smokeless tobacco: Never Used  . Alcohol Use: Yes    Family History  Problem Relation Age of Onset  . Diabetes Maternal Grandmother   . Kidney disease Maternal Grandmother     Allergies  Allergen Reactions  . Bactrim [Sulfamethoxazole-Trimethoprim] Other (See Comments)    Renal failure  . Cauliflower [Brassica Oleracea Italica] Anaphylaxis and Other (See Comments)    Pt states his throat closes up  . Vancomycin Other (See Comments)    Patient states vancomycin caused kidney injury  . Amlodipine Palpitations    fatigue  . Augmentin [Amoxicillin-Pot Clavulanate] Other (See Comments)    unknown  . Banana Other (See Comments)  . Fish Allergy Hives  . Onion Rash     Current outpatient prescriptions:  .  abacavir (ZIAGEN) 300 MG tablet, Take 2 tablets (600 mg total) by mouth daily., Disp: 60 tablet, Rfl: 11 .  albuterol (PROVENTIL HFA;VENTOLIN HFA) 108 (90 BASE) MCG/ACT inhaler, Inhale 2 puffs into the lungs every 6 (six) hours as needed for wheezing or shortness of breath., Disp: 1 Inhaler, Rfl: 2 .  carvedilol (COREG) 12.5  MG tablet, Take 12.5 mg by mouth 2 (two) times daily., Disp: , Rfl: 12 .  dapsone 100 MG tablet, Take 1 tablet (100 mg total) by mouth daily., Disp: 30 tablet, Rfl: 1 .  darunavir-cobicistat (PREZCOBIX) 800-150 MG per tablet, Take 1 tablet by mouth daily. Swallow whole. Do NOT crush, break or chew tablets. Take with food., Disp: 30 tablet, Rfl: 11 .  dolutegravir (TIVICAY) 50 MG tablet, Take 1 tablet (50 mg total) by mouth daily., Disp: 30 tablet, Rfl: 11 .  ondansetron (ZOFRAN) 4 MG tablet, 30 minutes before her meds as needed for nausea (Patient taking differently: Take 4 mg by mouth every 8 (eight) hours as needed for nausea or vomiting. 30 minutes before her meds as needed for nausea), Disp: 30 tablet, Rfl: 3 .  oxyCODONE (OXY IR/ROXICODONE) 5 MG immediate release tablet, Take 1 tablet (5 mg total) by mouth every 4 (four) hours as needed for moderate pain., Disp: 20 tablet, Rfl: 0  Filed Vitals:   02/16/15 1220 02/16/15 1222  BP: 174/101 157/98  Pulse: 95   Temp: 98.2 F (36.8 C)   TempSrc: Oral   Resp: 16   Height: 5' 7.5" (1.715 m)   Weight: 172 lb 9.6 oz (78.291 kg)     Body mass index is 26.62 kg/(m^2).           Review of Systems Denies chest pain,  dyspnea on exertion, PND, orthopnea. Does have a history of leg swelling and DVT. As noted above has HIV positive status. -other systems negative complete review of systems other than history of shingles     Objective:   Physical Exam BP 157/98 mmHg  Pulse 95  Temp(Src) 98.2 F (36.8 C) (Oral)  Resp 16  Ht 5' 7.5" (1.715 m)  Wt 172 lb 9.6 oz (78.291 kg)  BMI 26.62 kg/m2  Gen.-alert and oriented x3 in no apparent distress HEENT normal for age Lungs no rhonchi or wheezing Cardiovascular regular rhythm no murmurs carotid pulses 3+ palpable no bruits audible Abdomen soft nontender no palpable masses Musculoskeletal free of  major deformities Skin clear -no rashes Neurologic normal Lower extremities 3+ femoral and  dorsalis pedis pulses palpable bilaterally with no edema  both upper extremities examined. Cephalic vein in the left forearm appears to be  And excellent vein communicating with the basilic system in the antecubital area.   Today I ordered bilateral vein mapping and arterial studies. There are serial studies in the upper extremities are normal. The vein mapping confirms a widely patent cephalic vein in the left forearm as well as upper arm.      Assessment:      #1 end-stage renal disease-on dialysis Tuesday Thursday Saturday -started last week #2 HIV positive #3 history of shingles     Plan:      plan left radial cephalic AV fistula on Wednesday January 4 - risks and benefits discussed with patient and he would like to proceed

## 2015-02-16 NOTE — Progress Notes (Signed)
Filed Vitals:   02/16/15 1220 02/16/15 1222  BP: 174/101 157/98  Pulse: 95   Temp: 98.2 F (36.8 C)   TempSrc: Oral   Resp: 16   Height: 5' 7.5" (1.715 m)   Weight: 172 lb 9.6 oz (78.291 kg)

## 2015-02-19 NOTE — Progress Notes (Signed)
I am closing these encounters as I am responsible for the [facility] now and the provider at this time is no longer employed at our facility to close them. I did not provide this service on this date.    Nicoletta Ba, PharmD, BCPS, [facility]  [phone]

## 2015-02-19 NOTE — Progress Notes (Signed)
I am closing these encounters as I am responsible for the [facility] now and the provider at this time is no longer employed at our facility to close them. I did not provide this service on this date.    Nicoletta Ba, PharmD, BCPS, [facility]  [phone]

## 2015-02-19 NOTE — Progress Notes (Signed)
I am closing these encounters as I am responsible for the [facility] now and the provider at this time is no longer employed at our facility to close them. I did not provide this service on this date.    Nicoletta Ba, PharmD, BCPS, [facility]  [phone]

## 2015-02-19 NOTE — Progress Notes (Signed)
I am closing these encounters as I am responsible for the [facility] now and the provider at this time is no longer employed at our facility to close them. I did not provide this service on this date.    Nicoletta Ba, PharmD, BCPS, [facility]  [phone]

## 2015-02-19 NOTE — Progress Notes (Signed)
I am closing these encounters as I am responsible for the [facility] now and the provider at this time is no longer employed at our facility to close them. I did not provide this service on this date.    Nicoletta Ba, PharmD, BCPS, [facility]  [phone]

## 2015-02-25 ENCOUNTER — Encounter (HOSPITAL_COMMUNITY): Payer: Self-pay | Admitting: Cardiology

## 2015-02-25 ENCOUNTER — Inpatient Hospital Stay (HOSPITAL_COMMUNITY)
Admission: EM | Admit: 2015-02-25 | Discharge: 2015-02-27 | DRG: 974 | Disposition: A | Discharge status: Home / Self Care | Payer: PRIVATE HEALTH INSURANCE | Attending: Internal Medicine | Admitting: Internal Medicine

## 2015-02-25 ENCOUNTER — Emergency Department (HOSPITAL_COMMUNITY): Payer: PRIVATE HEALTH INSURANCE

## 2015-02-25 DIAGNOSIS — Z79891 Long term (current) use of opiate analgesic: Secondary | ICD-10-CM | POA: Diagnosis not present

## 2015-02-25 DIAGNOSIS — D696 Thrombocytopenia, unspecified: Secondary | ICD-10-CM | POA: Diagnosis present

## 2015-02-25 DIAGNOSIS — N186 End stage renal disease: Secondary | ICD-10-CM | POA: Diagnosis present

## 2015-02-25 DIAGNOSIS — R000 Tachycardia, unspecified: Secondary | ICD-10-CM

## 2015-02-25 DIAGNOSIS — E876 Hypokalemia: Secondary | ICD-10-CM | POA: Diagnosis present

## 2015-02-25 DIAGNOSIS — Z881 Allergy status to other antibiotic agents status: Secondary | ICD-10-CM | POA: Diagnosis not present

## 2015-02-25 DIAGNOSIS — Z91013 Allergy to seafood: Secondary | ICD-10-CM

## 2015-02-25 DIAGNOSIS — I120 Hypertensive chronic kidney disease with stage 5 chronic kidney disease or end stage renal disease: Secondary | ICD-10-CM | POA: Diagnosis present

## 2015-02-25 DIAGNOSIS — E43 Unspecified severe protein-calorie malnutrition: Secondary | ICD-10-CM | POA: Diagnosis present

## 2015-02-25 DIAGNOSIS — M25559 Pain in unspecified hip: Secondary | ICD-10-CM

## 2015-02-25 DIAGNOSIS — J189 Pneumonia, unspecified organism: Secondary | ICD-10-CM | POA: Diagnosis not present

## 2015-02-25 DIAGNOSIS — Z6826 Body mass index (BMI) 26.0-26.9, adult: Secondary | ICD-10-CM | POA: Diagnosis not present

## 2015-02-25 DIAGNOSIS — Z91018 Allergy to other foods: Secondary | ICD-10-CM | POA: Diagnosis not present

## 2015-02-25 DIAGNOSIS — Z79899 Other long term (current) drug therapy: Secondary | ICD-10-CM | POA: Diagnosis not present

## 2015-02-25 DIAGNOSIS — M79606 Pain in leg, unspecified: Secondary | ICD-10-CM

## 2015-02-25 DIAGNOSIS — Z86718 Personal history of other venous thrombosis and embolism: Secondary | ICD-10-CM

## 2015-02-25 DIAGNOSIS — M79604 Pain in right leg: Secondary | ICD-10-CM

## 2015-02-25 DIAGNOSIS — B20 Human immunodeficiency virus [HIV] disease: Secondary | ICD-10-CM | POA: Diagnosis present

## 2015-02-25 DIAGNOSIS — M79609 Pain in unspecified limb: Secondary | ICD-10-CM | POA: Diagnosis not present

## 2015-02-25 DIAGNOSIS — R509 Fever, unspecified: Secondary | ICD-10-CM

## 2015-02-25 DIAGNOSIS — Z888 Allergy status to other drugs, medicaments and biological substances status: Secondary | ICD-10-CM

## 2015-02-25 DIAGNOSIS — A419 Sepsis, unspecified organism: Principal | ICD-10-CM | POA: Diagnosis present

## 2015-02-25 DIAGNOSIS — Z992 Dependence on renal dialysis: Secondary | ICD-10-CM | POA: Diagnosis not present

## 2015-02-25 DIAGNOSIS — D638 Anemia in other chronic diseases classified elsewhere: Secondary | ICD-10-CM | POA: Diagnosis present

## 2015-02-25 HISTORY — DX: Fever, unspecified: R50.9

## 2015-02-25 HISTORY — DX: Pain in leg, unspecified: M79.606

## 2015-02-25 LAB — COMPREHENSIVE METABOLIC PANEL
ALT: 16 U/L — ABNORMAL LOW (ref 17–63)
AST: 35 U/L (ref 15–41)
Albumin: 2.6 g/dL — ABNORMAL LOW (ref 3.5–5.0)
Alkaline Phosphatase: 37 U/L — ABNORMAL LOW (ref 38–126)
Anion gap: 9 (ref 5–15)
BUN: 7 mg/dL (ref 6–20)
CO2: 27 mmol/L (ref 22–32)
Calcium: 8.4 mg/dL — ABNORMAL LOW (ref 8.9–10.3)
Chloride: 97 mmol/L — ABNORMAL LOW (ref 101–111)
Creatinine, Ser: 3.15 mg/dL — ABNORMAL HIGH (ref 0.61–1.24)
GFR calc Af Amer: 28 mL/min — ABNORMAL LOW (ref >60)
GFR calc non Af Amer: 25 mL/min — ABNORMAL LOW (ref >60)
Glucose, Bld: 84 mg/dL (ref 65–99)
Potassium: 3.1 mmol/L — ABNORMAL LOW (ref 3.5–5.1)
Sodium: 133 mmol/L — ABNORMAL LOW (ref 135–145)
Total Bilirubin: 0.7 mg/dL (ref 0.3–1.2)
Total Protein: 8.5 g/dL — ABNORMAL HIGH (ref 6.5–8.1)

## 2015-02-25 LAB — CBC WITH DIFFERENTIAL/PLATELET
Basophils Absolute: 0 10*3/uL (ref 0.0–0.1)
Basophils Relative: 1 %
Eosinophils Absolute: 0.1 10*3/uL (ref 0.0–0.7)
Eosinophils Relative: 1 %
HCT: 23.9 % — ABNORMAL LOW (ref 39.0–52.0)
Hemoglobin: 7.9 g/dL — ABNORMAL LOW (ref 13.0–17.0)
Lymphocytes Relative: 18 %
Lymphs Abs: 1.6 10*3/uL (ref 0.7–4.0)
MCH: 29.3 pg (ref 26.0–34.0)
MCHC: 33.1 g/dL (ref 30.0–36.0)
MCV: 88.5 fL (ref 78.0–100.0)
Monocytes Absolute: 0.6 10*3/uL (ref 0.1–1.0)
Monocytes Relative: 6 %
Neutro Abs: 6.6 10*3/uL (ref 1.7–7.7)
Neutrophils Relative %: 74 %
Platelets: 127 10*3/uL — ABNORMAL LOW (ref 150–400)
RBC: 2.7 MIL/uL — ABNORMAL LOW (ref 4.22–5.81)
RDW: 13.8 % (ref 11.5–15.5)
WBC: 8.8 10*3/uL (ref 4.0–10.5)

## 2015-02-25 LAB — SEDIMENTATION RATE: Sed Rate: 122 mm/hr — ABNORMAL HIGH (ref 0–16)

## 2015-02-25 LAB — CREATININE, SERUM
Creatinine, Ser: 4.12 mg/dL — ABNORMAL HIGH (ref 0.61–1.24)
GFR calc Af Amer: 21 mL/min — ABNORMAL LOW (ref >60)
GFR calc non Af Amer: 18 mL/min — ABNORMAL LOW (ref >60)

## 2015-02-25 LAB — INFLUENZA PANEL BY PCR (TYPE A & B)
H1N1 flu by pcr: NOT DETECTED
Influenza A By PCR: NEGATIVE
Influenza B By PCR: NEGATIVE

## 2015-02-25 LAB — CBC
HCT: 20.5 % — ABNORMAL LOW (ref 39.0–52.0)
Hemoglobin: 6.7 g/dL — CL (ref 13.0–17.0)
MCH: 28.6 pg (ref 26.0–34.0)
MCHC: 32.7 g/dL (ref 30.0–36.0)
MCV: 87.6 fL (ref 78.0–100.0)
Platelets: 113 10*3/uL — ABNORMAL LOW (ref 150–400)
RBC: 2.34 MIL/uL — ABNORMAL LOW (ref 4.22–5.81)
RDW: 13.8 % (ref 11.5–15.5)
WBC: 8.9 10*3/uL (ref 4.0–10.5)

## 2015-02-25 LAB — URINALYSIS, ROUTINE W REFLEX MICROSCOPIC
Bilirubin Urine: NEGATIVE
Glucose, UA: NEGATIVE mg/dL
Ketones, ur: NEGATIVE mg/dL
Leukocytes, UA: NEGATIVE
Nitrite: NEGATIVE
Protein, ur: >300 mg/dL — AB
Specific Gravity, Urine: 1.01 (ref 1.005–1.030)
pH: 8 (ref 5.0–8.0)

## 2015-02-25 LAB — URINE MICROSCOPIC-ADD ON: Bacteria, UA: NONE SEEN

## 2015-02-25 LAB — PREPARE RBC (CROSSMATCH)

## 2015-02-25 LAB — I-STAT CG4 LACTIC ACID, ED
Lactic Acid, Venous: 1.36 mmol/L (ref 0.5–2.0)
Lactic Acid, Venous: 0.44 mmol/L — ABNORMAL LOW (ref 0.5–2.0)

## 2015-02-25 LAB — C-REACTIVE PROTEIN: CRP: 8.2 mg/dL — ABNORMAL HIGH (ref <1.0)

## 2015-02-25 MED ORDER — HYDROMORPHONE HCL 1 MG/ML IJ SOLN
1.0000 mg | Freq: Once | INTRAMUSCULAR | Status: AC
  Administered 2015-02-25: 1 mg via INTRAVENOUS
  Filled 2015-02-25: qty 1

## 2015-02-25 MED ORDER — HEPARIN SODIUM (PORCINE) 5000 UNIT/ML IJ SOLN
5000.0000 [IU] | Freq: Three times a day (TID) | INTRAMUSCULAR | Status: DC
  Administered 2015-02-25 – 2015-02-27 (×5): 5000 [IU] via SUBCUTANEOUS
  Filled 2015-02-25 (×5): qty 1

## 2015-02-25 MED ORDER — SODIUM CHLORIDE 0.9 % IV SOLN: Freq: Once | INTRAVENOUS | Status: DC

## 2015-02-25 MED ORDER — SULFAMETHOXAZOLE-TRIMETHOPRIM 400-80 MG/5ML IV SOLN
380.0000 mg | INTRAVENOUS | Status: DC
  Filled 2015-02-25: qty 23.8

## 2015-02-25 MED ORDER — SODIUM CHLORIDE 0.9 % IV BOLUS (SEPSIS)
1000.0000 mL | Freq: Once | INTRAVENOUS | Status: AC
Start: 2015-02-25 — End: 2015-02-25
  Administered 2015-02-25: 1000 mL via INTRAVENOUS

## 2015-02-25 MED ORDER — DOLUTEGRAVIR SODIUM 50 MG PO TABS
50.0000 mg | ORAL_TABLET | Freq: Every day | ORAL | Status: DC
  Administered 2015-02-26: 50 mg via ORAL
  Filled 2015-02-25 (×3): qty 1

## 2015-02-25 MED ORDER — ACETAMINOPHEN 325 MG PO TABS
650.0000 mg | ORAL_TABLET | ORAL | Status: DC | PRN
  Administered 2015-02-25: 650 mg via ORAL
  Filled 2015-02-25: qty 2

## 2015-02-25 MED ORDER — ACETAMINOPHEN 325 MG PO TABS
325.0000 mg | ORAL_TABLET | Freq: Once | ORAL | Status: AC
  Administered 2015-02-26: 325 mg via ORAL
  Filled 2015-02-25: qty 1

## 2015-02-25 MED ORDER — OXYCODONE HCL 5 MG PO TABS
5.0000 mg | ORAL_TABLET | ORAL | Status: DC | PRN
  Administered 2015-02-25 – 2015-02-27 (×6): 5 mg via ORAL
  Filled 2015-02-25 (×6): qty 1

## 2015-02-25 MED ORDER — LEVOFLOXACIN IN D5W 750 MG/150ML IV SOLN
750.0000 mg | Freq: Once | INTRAVENOUS | Status: AC
  Administered 2015-02-26: 750 mg via INTRAVENOUS
  Filled 2015-02-25: qty 150

## 2015-02-25 MED ORDER — SODIUM CHLORIDE 0.9 % IV BOLUS (SEPSIS)
500.0000 mL | Freq: Once | INTRAVENOUS | Status: AC
Start: 2015-02-25 — End: 2015-02-25
  Administered 2015-02-25: 500 mL via INTRAVENOUS

## 2015-02-25 MED ORDER — CEFEPIME HCL 2 G IJ SOLR
2.0000 g | Freq: Once | INTRAMUSCULAR | Status: AC
  Administered 2015-02-25: 2 g via INTRAVENOUS
  Filled 2015-02-25 (×2): qty 2

## 2015-02-25 MED ORDER — ABACAVIR SULFATE 300 MG PO TABS
600.0000 mg | ORAL_TABLET | Freq: Every day | ORAL | Status: DC
Start: 2015-02-25 — End: 2015-02-27
  Administered 2015-02-26: 600 mg via ORAL
  Filled 2015-02-25 (×3): qty 2

## 2015-02-25 MED ORDER — PIPERACILLIN-TAZOBACTAM 3.375 G IVPB 30 MIN: 3.3750 g | Freq: Once | INTRAVENOUS | Status: DC

## 2015-02-25 MED ORDER — CARVEDILOL 12.5 MG PO TABS
12.5000 mg | ORAL_TABLET | Freq: Two times a day (BID) | ORAL | Status: DC
  Administered 2015-02-25 – 2015-02-27 (×4): 12.5 mg via ORAL
  Filled 2015-02-25 (×4): qty 1

## 2015-02-25 MED ORDER — SODIUM CHLORIDE 0.9 % IV SOLN
INTRAVENOUS | Status: DC
  Administered 2015-02-25: 17:00:00 via INTRAVENOUS

## 2015-02-25 MED ORDER — ACETAMINOPHEN 325 MG PO TABS
650.0000 mg | ORAL_TABLET | Freq: Once | ORAL | Status: AC
  Administered 2015-02-25: 650 mg via ORAL
  Filled 2015-02-25: qty 2

## 2015-02-25 MED ORDER — LEVOFLOXACIN IN D5W 500 MG/100ML IV SOLN: 500.0000 mg | INTRAVENOUS | Status: DC

## 2015-02-25 MED ORDER — DARUNAVIR-COBICISTAT 800-150 MG PO TABS
1.0000 | ORAL_TABLET | Freq: Every day | ORAL | Status: DC
  Administered 2015-02-26: 1 via ORAL
  Filled 2015-02-25 (×3): qty 1

## 2015-02-25 MED ORDER — HYDROMORPHONE HCL 1 MG/ML IJ SOLN
1.0000 mg | Freq: Once | INTRAMUSCULAR | Status: AC
Start: 2015-02-25 — End: 2015-02-25
  Administered 2015-02-25: 1 mg via INTRAVENOUS
  Filled 2015-02-25: qty 1

## 2015-02-25 MED ORDER — POTASSIUM CHLORIDE CRYS ER 20 MEQ PO TBCR
40.0000 meq | EXTENDED_RELEASE_TABLET | Freq: Once | ORAL | Status: AC
Start: 2015-02-25 — End: 2015-02-25
  Administered 2015-02-25: 40 meq via ORAL
  Filled 2015-02-25: qty 2

## 2015-02-25 MED ORDER — LEVOFLOXACIN IN D5W 750 MG/150ML IV SOLN: 750.0000 mg | INTRAVENOUS | Status: DC

## 2015-02-25 MED ORDER — DAPSONE 100 MG PO TABS
100.0000 mg | ORAL_TABLET | Freq: Every day | ORAL | Status: DC
  Administered 2015-02-26: 100 mg via ORAL
  Filled 2015-02-25 (×3): qty 1

## 2015-02-25 NOTE — Progress Notes (Addendum)
ANTIBIOTIC CONSULT NOTE - INITIAL  Pharmacy Consult for cefepime Indication: Possible port infection  Allergies  Allergen Reactions  . Bactrim [Sulfamethoxazole-Trimethoprim] Other (See Comments)    Renal failure  . Cauliflower [Brassica Oleracea Italica] Anaphylaxis and Other (See Comments)    Pt states his throat closes up  . Vancomycin Other (See Comments)    Patient states vancomycin caused kidney injury  . Amlodipine Palpitations    fatigue  . Augmentin [Amoxicillin-Pot Clavulanate] Other (See Comments)    unknown  . Banana Other (See Comments)  . Fish Allergy Hives  . Onion Rash    Patient Measurements: Height: 5\' 7"  (170.2 cm) Weight: 167 lb (75.751 kg) IBW/kg (Calculated) : 66.1 Adjusted Body Weight:  Vital Signs: Temp: 101.6 F (38.7 C) (12/29 1223) Temp Source: Oral (12/29 1223) BP: 140/83 mmHg (12/29 1223) Pulse Rate: 117 (12/29 1223) Intake/Output from previous day:   Intake/Output from this shift:    Labs: No results for input(s): WBC, HGB, PLT, LABCREA, CREATININE in the last 72 hours. Estimated Creatinine Clearance: 9.3 mL/min (by C-G formula based on Cr of 10.71). No results for input(s): VANCOTROUGH, VANCOPEAK, VANCORANDOM, GENTTROUGH, GENTPEAK, GENTRANDOM, TOBRATROUGH, TOBRAPEAK, TOBRARND, AMIKACINPEAK, AMIKACINTROU, AMIKACIN in the last 72 hours.   Microbiology: No results found for this or any previous visit (from the past 720 hour(s)).  Medical History: Past Medical History  Diagnosis Date  . Shingles < 2010  . HIV infection ([place]) dx'd 10/2013  . Cellulitis 11/2013    LLE  . CAP (community acquired pneumonia) 10/2013  . DVT (deep venous thrombosis) ([place]) 10/2013    BLE  . Multiple food allergies   . Multiple environmental allergies     "trees, dogs, cats"  . Chronic kidney disease   . 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 Hypertension     Medications:  See EMR   Assessment: ESRD on HD (new-start) admitted for fever and  possible port infection. Pt received vancomycin x1 today at HD. Tm 101.6, LA wnl, blood and urine cx drawn.  Goal of Therapy:  Resolution of infection  Plan:  -Cefepme 2g IV x1 then 2g IV qHD -Monitor cultures, HD schedule  -Resumption of vanc  Masters, Jake Church 02/25/2015,1:06 PM  Addendum -Add Bactrim for empiric PCP coverage -HIV RNA elevated, CD4 50 -Watch cultures -Bactrim 5 mg/kg IV q24h -Pt has documented Bactrim allergy but is already ESRD   Harvel Quale  02/25/2015 2:46 PM  ADDN: Pharmacy is consulted to renally adjust levaquin. Pt received cefepime 2g IV once in the ED which is good for 24h and will start levaquin tomorrow morning.  Plan: Levaquin 750mg  IV once followed by 500mg  q48h for 5 day course  Andrey Cota. Diona Foley, PharmD, [facility] Pharmacist Pager [phone]

## 2015-02-25 NOTE — ED Notes (Signed)
Pt reports he has been having cramps in his legs for the past couple of days. Today at dialysis he had a fever and was told to come here. States they are concerned that his port may be infected and gave him vancomycin there.

## 2015-02-25 NOTE — Progress Notes (Signed)
Pt with continued temp of 100.2 post admin of 650 mg tylenol p.o. And 500cc bolus. Page to K. Schorr to ensure it is OK to administer blood at this time with call back. New order for an additional 325 mg tylenol p.o prior to starting blood, then OK to transfuse. Dorthey Sawyer, RN

## 2015-02-25 NOTE — Progress Notes (Addendum)
Pt with elevated temp of 101.1. Denies chills. Page to K. Schorr for notification. New order for Tylenol 650 prn and 500cc NS bolus. Dorthey Sawyer, RN

## 2015-02-25 NOTE — H&P (Addendum)
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 Today was brought to the hospital after patient was found to have fever during the session of hemodialysis. Patient was given vancomycin and sent to the ED for further evaluation. Patient was found to be tachycardic and febrile. Code sepsis was called. Patient was given 1 L normal saline bolus blood pressure has now improved, was not given 30 ml/kg  fluid bolus due to the ESRD. As patient complained of right thigh pain CT scan of the right leg was done which was negative for any significant abnormality. Lower extremity venous duplex was done which was negative for DVT. Patient only complains of cough with yellow-colored phlegm which started this morning before he went for hemodialysis. He denies chest pain. He also had shortness of breath this morning. Denies nausea vomiting or diarrhea. Patient has history of AIDS with last CD4 count of 20 , and is followed by infectious disease as outpatient. Currently patient is on antiretroviral therapy, dapsone for PCP prophylaxis.  Patient currently not hypoxic, not requiring oxygen O2 sats 99 percent on room air  Allergies:   Allergies  Allergen Reactions  . Bactrim [Sulfamethoxazole-Trimethoprim] Other (See Comments)    Renal failure  . Cauliflower [Brassica Oleracea Italica] Anaphylaxis and Other (See Comments)    Pt states his throat closes up  . Vancomycin Other (See Comments)    Patient states vancomycin caused kidney injury  . Amlodipine Palpitations    fatigue  . Augmentin [Amoxicillin-Pot Clavulanate] Other (See  Comments)    unknown  . Banana Other (See Comments)  . Fish Allergy Hives  . Onion Rash      Past Medical History  Diagnosis Date  . Shingles < 2010  . HIV infection ([place]) dx'd 10/2013  . Cellulitis 11/2013    LLE  . CAP (community acquired pneumonia) 10/2013  . DVT (deep venous thrombosis) ([place]) 10/2013    BLE  . Multiple food allergies   . Multiple environmental allergies     "trees, dogs, cats"  . Chronic kidney disease   . 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 Hypertension     Past Surgical History  Procedure Laterality Date  . Femur fracture surgery Right 09/2001  . Fracture surgery    . I&d extremity Right 12/18/2014    Procedure: IRRIGATION AND DEBRIDEMENT RIGHT LOWER EXTREMITY;  Surgeon: Ralene Ok, MD;  Location: [facility];  Service: General;  Laterality: Right;    Prior to Admission medications   Medication Sig Start Date End Date Taking? Authorizing Provider  abacavir (ZIAGEN) 300 MG tablet Take 2 tablets (600 mg total) by mouth daily. 08/26/14  Yes Carlyle Basques, MD  albuterol (PROVENTIL HFA;VENTOLIN HFA) 108 (90 BASE) MCG/ACT inhaler Inhale 2 puffs into the lungs every 6 (six) hours as needed for wheezing or shortness of breath. 08/02/14  Yes Costin Karlyne Greenspan, MD  carvedilol (COREG) 12.5 MG tablet Take 12.5 mg by mouth 2 (two) times daily. 01/28/15  Yes Historical Provider, MD  dapsone 100 MG tablet Take 1 tablet (100 mg total) by mouth daily. 08/02/14  Yes Costin Karlyne Greenspan, MD  darunavir-cobicistat (PREZCOBIX) 800-150 MG per tablet Take 1 tablet by mouth daily. Swallow whole. Do  NOT crush, break or chew tablets. Take with food. 08/26/14  Yes Carlyle Basques, MD  dolutegravir (TIVICAY) 50 MG tablet Take 1 tablet (50 mg total) by mouth daily. 08/26/14  Yes Carlyle Basques, MD  ondansetron (ZOFRAN) 4 MG tablet 30 minutes before her meds as needed for nausea Patient taking differently: Take 4 mg by mouth every 8 (eight) hours as needed for nausea or vomiting. 30 minutes  before her meds as needed for nausea 10/12/14  Yes Michel Bickers, MD  oxyCODONE (OXY IR/ROXICODONE) 5 MG immediate release tablet Take 1 tablet (5 mg total) by mouth every 4 (four) hours as needed for moderate pain. 12/23/14  Yes Geradine Girt, DO    Social History:  reports that he has never smoked. He has never used smokeless tobacco. He reports that he drinks alcohol. He reports that he does not use illicit drugs.  Family History  Problem Relation Age of Onset  . Diabetes Maternal Grandmother   . Kidney disease Maternal Grandmother     Filed Weights   02/25/15 1240  Weight: 75.751 kg (167 lb)    All the positives are listed in BOLD  Review of Systems:  HEENT: Headache, blurred vision, runny nose, sore throat Neck: Hypothyroidism, hyperthyroidism,,lymphadenopathy Chest : Shortness of breath, history of COPD, Asthma, cough Heart : Chest pain, history of coronary arterey disease GI:  Nausea, vomiting, diarrhea, constipation, GERD GU: Dysuria, urgency, frequency of urination, hematuria Neuro: Stroke, seizures, syncope Psych: Depression, anxiety, hallucinations   Physical Exam: Blood pressure 160/98, pulse 106, temperature 101.6 F (38.7 C), temperature source Oral, resp. rate 22, height 5\' 7"  (1.702 m), weight 75.751 kg (167 lb), SpO2 99 %. Constitutional:   Patient is a well-developed and well-nourished male* in no acute distress and cooperative with exam. Head: Normocephalic and atraumatic Mouth: Mucus membranes moist Eyes: PERRL, EOMI, conjunctivae normal Neck: Supple, No Thyromegaly Cardiovascular: RRR, S1 normal, S2 normal Pulmonary/Chest: CTAB, no wheezes, rales, or rhonchi. Hemodialysis catheter in place, no erythema or tenderness noted around the catheter site. Abdominal: Soft. Non-tender, non-distended, bowel sounds are normal, no masses, organomegaly, or guarding present.  Neurological: A&O x3, Strength is normal and symmetric bilaterally, cranial nerve II-XII are  grossly intact, no focal motor deficit, sensory intact to light touch bilaterally.  Extremities : No Cyanosis, Clubbing or Edema. Tenderness noted on palpation of right thigh  Labs on Admission:  Basic Metabolic Panel:  Recent Labs Lab 02/25/15 1215  NA 133*  K 3.1*  CL 97*  CO2 27  GLUCOSE 84  BUN 7  CREATININE 3.15*  CALCIUM 8.4*   Liver Function Tests:  Recent Labs Lab 02/25/15 1215  AST 35  ALT 16*  ALKPHOS 37*  BILITOT 0.7  PROT 8.5*  ALBUMIN 2.6*   No results for input(s): LIPASE, AMYLASE in the last 168 hours. No results for input(s): AMMONIA in the last 168 hours. CBC:  Recent Labs Lab 02/25/15 1215  WBC 8.8  NEUTROABS 6.6  HGB 7.9*  HCT 23.9*  MCV 88.5  PLT 127*   Cardiac Enzymes: No results for input(s): CKTOTAL, CKMB, CKMBINDEX, TROPONINI in the last 168 hours.  BNP (last 3 results)  Recent Labs  07/30/14 0350  BNP 28.7    Radiological Exams on Admission: Dg Chest 2 View  02/25/2015  CLINICAL DATA:  Shortness of breath. EXAM: CHEST  2 VIEW COMPARISON:  January 12, 2015. FINDINGS: The heart size and mediastinal contours are within normal limits. No pneumothorax or pleural effusion is noted. Interval  placement of right internal jugular dialysis catheter is noted with distal tip in expected position of SVC. Left lung is clear. New right upper lobe airspace opacity is noted concerning for possible pneumonia. The visualized skeletal structures are unremarkable. IMPRESSION: New right upper lobe airspace opacity is noted concerning for pneumonia or possibly atelectasis. Electronically Signed   By: Marijo Conception, M.D.   On: 02/25/2015 13:20   Ct Femur Right Wo Contrast  02/25/2015  CLINICAL DATA:  Pain in the mid calf.  History of myositis. EXAM: CT OF THE RIGHT FEMUR WITHOUT CONTRAST TECHNIQUE: Multidetector CT imaging was performed according to the standard protocol. Multiplanar CT image reconstructions were also generated. COMPARISON:  CT scan  dated 12/18/2014 and radiographs dated 02/25/2015 and 12/22/2014 FINDINGS: The soft tissue swelling and edema seen in the anterior musculature of the right thigh on the prior CT scan of 12/18/2014 has completely resolved. The muscles of the thigh appear normal. The fracture of the mid femur is completely healed and unchanged. There is no knee joint effusion or hip joint effusion. IMPRESSION: No acute abnormalities of right femur or of the soft tissues of the right thigh. Resolution of the soft tissue swelling noted on the prior CT scan of 12/18/2014. Electronically Signed   By: Lorriane Shire M.D.   On: 02/25/2015 14:12   Dg Hip Unilat With Pelvis 2-3 Views Right  02/25/2015  CLINICAL DATA:  Right pelvic and hip pain, no known injury, initial encounter EXAM: DG HIP (WITH OR WITHOUT PELVIS) 2-3V RIGHT COMPARISON:  12/18/2014 FINDINGS: There again noted changes consistent with prior medullary rod placement. Healing femoral fracture is again noted. No acute fracture or dislocation is seen. IMPRESSION: Healing right femoral fracture following ORIF. No acute abnormality is noted. Electronically Signed   By: Inez Catalina M.D.   On: 02/25/2015 13:20    EKG: Independently reviewed. Normal sinus rhythm   Assessment/Plan Active Problems:   Sepsis ([place])   CAP (community acquired pneumonia)   AIDS  Community acquired pneumonia Will admit the patient and start Levaquin per pharmacy consultation. Check urine for Legionella, urinary strep pneumonia and region, blood cultures 2, sputum culture. I called and discussed with infectious disease Dr. Linus Salmons, who does not recommend starting treatment for healthcare associated pneumonia.   AIDS  patient has CD4 20, continue antiretroviral therapy Dapsone for PCP prophylaxis  ESRD  patient is on hemodialysis.  Hypokalemia   replace potassium and check BMP in a.m.  Right leg pain  patient had I& of right thigh abscess in October. CT scan of the femur/thigh  shows no significant abnormality, and shows resolution of the soft tissue swelling noted on prior CT.    Hypertension Continue Coreg 12.5 m twice a day  DVT prophylaxis Heparin  Code status: Full code  Family discussion: No family present at bedside   Time Spent on Admission: 60 minutes  [facility] Pager: [phone] 02/25/2015, 3:43 PM  If 7PM-7AM, please contact night-coveragand hypokalemiae  [email or web address]  Password [email or web address]

## 2015-02-25 NOTE — ED Provider Notes (Signed)
CSN: [id number]     Arrival date & time 02/25/15  1118 History   First MD Initiated Contact with Patient 02/25/15 1138     Chief Complaint  Patient presents with  . Fever  . Leg Pain     (Consider location/radiation/quality/duration/timing/severity/associated sxs/prior Treatment) Patient is a 34 y.o. male presenting with fever and leg pain. The history is provided by the patient.  Fever Temp source:  Subjective Severity:  Moderate Timing:  Constant Progression:  Worsening Chronicity:  New Relieved by:  None tried Worsened by:  Nothing tried Ineffective treatments:  None tried Associated symptoms: chills and myalgias   Associated symptoms: no chest pain, no confusion, no congestion, no cough, no diarrhea, no dysuria, no ear pain, no headaches, no nausea, no rash, no rhinorrhea, no somnolence, no sore throat and no vomiting   Risk factors: immunosuppression (Pt has AIDS)   Risk factors: no recent travel and no sick contacts   Leg Pain Associated symptoms: fever   Associated symptoms: no back pain and no neck pain     Past Medical History  Diagnosis Date  . Shingles < 2010  . HIV infection ([place]) dx'd 10/2013  . Cellulitis 11/2013    LLE  . CAP (community acquired pneumonia) 10/2013  . DVT (deep venous thrombosis) ([place]) 10/2013    BLE  . Multiple food allergies   . Multiple environmental allergies     "trees, dogs, cats"  . Chronic kidney disease   . 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 Hypertension    Past Surgical History  Procedure Laterality Date  . Femur fracture surgery Right 09/2001  . Fracture surgery    . I&d extremity Right 12/18/2014    Procedure: IRRIGATION AND DEBRIDEMENT RIGHT LOWER EXTREMITY;  Surgeon: Ralene Ok, MD;  Location: [facility];  Service: General;  Laterality: Right;   Family History  Problem Relation Age of Onset  . Diabetes Maternal Grandmother   . Kidney disease Maternal Grandmother    Social History  Substance Use Topics  .  Smoking status: Never Smoker   . Smokeless tobacco: Never Used  . Alcohol Use: Yes    Review of Systems  Constitutional: Positive for fever and chills.  HENT: Negative for congestion, ear pain, rhinorrhea and sore throat.   Eyes: Negative for pain.  Respiratory: Negative for cough and chest tightness.   Cardiovascular: Negative for chest pain.  Gastrointestinal: Negative for nausea, vomiting and diarrhea.  Genitourinary: Negative for dysuria.  Musculoskeletal: Positive for myalgias, arthralgias and gait problem. Negative for back pain and neck pain.  Skin: Negative for rash.  Neurological: Negative for headaches.  Psychiatric/Behavioral: Negative for confusion.      Allergies  Bactrim; Cauliflower; Vancomycin; Amlodipine; Augmentin; Banana; Fish allergy; and Onion  Home Medications   Prior to Admission medications   Medication Sig Start Date End Date Taking? Authorizing Provider  abacavir (ZIAGEN) 300 MG tablet Take 2 tablets (600 mg total) by mouth daily. 08/26/14  Yes Carlyle Basques, MD  albuterol (PROVENTIL HFA;VENTOLIN HFA) 108 (90 BASE) MCG/ACT inhaler Inhale 2 puffs into the lungs every 6 (six) hours as needed for wheezing or shortness of breath. 08/02/14  Yes Costin Karlyne Greenspan, MD  carvedilol (COREG) 12.5 MG tablet Take 12.5 mg by mouth 2 (two) times daily. 01/28/15  Yes Historical Provider, MD  dapsone 100 MG tablet Take 1 tablet (100 mg total) by mouth daily. 08/02/14  Yes Costin Karlyne Greenspan, MD  darunavir-cobicistat (PREZCOBIX) 800-150 MG per tablet Take 1 tablet by mouth  daily. Swallow whole. Do NOT crush, break or chew tablets. Take with food. 08/26/14  Yes Carlyle Basques, MD  dolutegravir (TIVICAY) 50 MG tablet Take 1 tablet (50 mg total) by mouth daily. 08/26/14  Yes Carlyle Basques, MD  ondansetron (ZOFRAN) 4 MG tablet 30 minutes before her meds as needed for nausea Patient taking differently: Take 4 mg by mouth every 8 (eight) hours as needed for nausea or vomiting. 30 minutes  before her meds as needed for nausea 10/12/14  Yes Michel Bickers, MD  oxyCODONE (OXY IR/ROXICODONE) 5 MG immediate release tablet Take 1 tablet (5 mg total) by mouth every 4 (four) hours as needed for moderate pain. 12/23/14  Yes Geradine Girt, DO  I'm very worried about this at BP 159/109 mmHg  Pulse 102  Temp(Src) 101.6 F (38.7 C) (Oral)  Resp 22  Ht 5\' 7"  (1.702 m)  Wt 75.751 kg  BMI 26.15 kg/m2  SpO2 99% Physical Exam  Constitutional: He appears well-developed and well-nourished. He appears ill. He appears distressed.  HENT:  Head: Head is without abrasion, without contusion and without laceration.  Mouth/Throat: Oropharynx is clear and moist and mucous membranes are normal.  Eyes: Conjunctivae and EOM are normal. Pupils are equal, round, and reactive to light.  Neck: Normal range of motion.  Cardiovascular: Regular rhythm and normal pulses.  Tachycardia present.   Pulmonary/Chest: Effort normal and breath sounds normal.  Abdominal: Normal appearance. There is no tenderness. There is no rigidity, no rebound, no guarding, no CVA tenderness, no tenderness at McBurney's point and negative Murphy's sign. No hernia.  Musculoskeletal:       Right hip: He exhibits decreased range of motion and tenderness. He exhibits no swelling, no deformity and no laceration.       Right upper leg: He exhibits tenderness. He exhibits no swelling, no edema, no deformity and no laceration.       Legs: Neurological: He is alert. He has normal strength. No cranial nerve deficit or sensory deficit. Coordination normal. GCS eye subscore is 4. GCS verbal subscore is 5. GCS motor subscore is 6.  Skin: No abrasion and no rash noted. He is not diaphoretic.       ED Course  Procedures (including critical care time) Labs Review Labs Reviewed  COMPREHENSIVE METABOLIC PANEL - Abnormal; Notable for the following:    Sodium 133 (*)    Potassium 3.1 (*)    Chloride 97 (*)    Creatinine, Ser 3.15 (*)     Calcium 8.4 (*)    Total Protein 8.5 (*)    Albumin 2.6 (*)    ALT 16 (*)    Alkaline Phosphatase 37 (*)    GFR calc non Af Amer 25 (*)    GFR calc Af Amer 28 (*)    All other components within normal limits  URINALYSIS, ROUTINE W REFLEX MICROSCOPIC (NOT AT [facility]) - Abnormal; Notable for the following:    Hgb urine dipstick MODERATE (*)    Protein, ur >300 (*)    All other components within normal limits  CBC WITH DIFFERENTIAL/PLATELET - Abnormal; Notable for the following:    RBC 2.70 (*)    Hemoglobin 7.9 (*)    HCT 23.9 (*)    Platelets 127 (*)    All other components within normal limits  SEDIMENTATION RATE - Abnormal; Notable for the following:    Sed Rate 122 (*)    All other components within normal limits  URINE MICROSCOPIC-ADD ON - Abnormal;  Notable for the following:    Squamous Epithelial / LPF 0-5 (*)    All other components within normal limits  I-STAT CG4 LACTIC ACID, ED - Abnormal; Notable for the following:    Lactic Acid, Venous 0.44 (*)    All other components within normal limits  CULTURE, BLOOD (ROUTINE X 2)  CULTURE, BLOOD (ROUTINE X 2)  URINE CULTURE  C-REACTIVE PROTEIN  I-STAT CG4 LACTIC ACID, ED    Imaging Review Dg Chest 2 View  02/25/2015  CLINICAL DATA:  Shortness of breath. EXAM: CHEST  2 VIEW COMPARISON:  January 12, 2015. FINDINGS: The heart size and mediastinal contours are within normal limits. No pneumothorax or pleural effusion is noted. Interval placement of right internal jugular dialysis catheter is noted with distal tip in expected position of SVC. Left lung is clear. New right upper lobe airspace opacity is noted concerning for possible pneumonia. The visualized skeletal structures are unremarkable. IMPRESSION: New right upper lobe airspace opacity is noted concerning for pneumonia or possibly atelectasis. Electronically Signed   By: Marijo Conception, M.D.   On: 02/25/2015 13:20   Ct Femur Right Wo Contrast  02/25/2015  CLINICAL DATA:   Pain in the mid calf.  History of myositis. EXAM: CT OF THE RIGHT FEMUR WITHOUT CONTRAST TECHNIQUE: Multidetector CT imaging was performed according to the standard protocol. Multiplanar CT image reconstructions were also generated. COMPARISON:  CT scan dated 12/18/2014 and radiographs dated 02/25/2015 and 12/22/2014 FINDINGS: The soft tissue swelling and edema seen in the anterior musculature of the right thigh on the prior CT scan of 12/18/2014 has completely resolved. The muscles of the thigh appear normal. The fracture of the mid femur is completely healed and unchanged. There is no knee joint effusion or hip joint effusion. IMPRESSION: No acute abnormalities of right femur or of the soft tissues of the right thigh. Resolution of the soft tissue swelling noted on the prior CT scan of 12/18/2014. Electronically Signed   By: Lorriane Shire M.D.   On: 02/25/2015 14:12   Dg Hip Unilat With Pelvis 2-3 Views Right  02/25/2015  CLINICAL DATA:  Right pelvic and hip pain, no known injury, initial encounter EXAM: DG HIP (WITH OR WITHOUT PELVIS) 2-3V RIGHT COMPARISON:  12/18/2014 FINDINGS: There again noted changes consistent with prior medullary rod placement. Healing femoral fracture is again noted. No acute fracture or dislocation is seen. IMPRESSION: Healing right femoral fracture following ORIF. No acute abnormality is noted. Electronically Signed   By: Inez Catalina M.D.   On: 02/25/2015 13:20   I have personally reviewed and evaluated these images and lab results as part of my medical decision-making.   EKG Interpretation None      MDM   Final diagnoses:  Hip pain  Leg pain  Right leg pain  Fever, unspecified fever cause  Tachycardia  Sepsis, due to unspecified organism [facility])    34 year old African-American male with history of AIDS, end-stage renal disease currently on dialysis through a right-sided PermCath, history of right leg myositis present in the setting of fever and leg pain. Patient  was at dialysis today received full session of dialysis. He was noted during session to have a fever. Patient given vancomycin during dialysis. Due to concern with fever and immunosuppression and sent to emergency department for further evaluation.  On arrival patient was found to be tachycardic and febrile. No hypotension noted. Code sepsis was called at that time. Due to dialysis-dependent state will not initiate 30 mL  per KG at this time. Will start with 1 L normal saline bolus. Patient reported pain in right thigh. He reports this is similar to both previous DVT and myositis and leg. Patient has no obvious erythema, fluctuance, induration and leg. He does have full range of motion although pain with passive range of motion at right hip joint. Patient's catheter site has mild erythema but no fluctuance or drainage. No obvious infection noted. Patient has no tenderness to palpation at that site. Patient denies any recent sick contacts, recent travel. He does report compliance with medication.  Patient had blood cultures, basic laboratory analysis, lactate, inflammatory markers, chest x-ray, ultrasound right leg, CT right leg obtained to further evaluate the cause of sepsis. Patient given cefepime in the emergency department as he has already received vancomycin in dialysis. Patient additionally given pain and antipyretic medication. We'll continue to monitor.  CT leg without obvious recurrence of abscess. No significant fracture noted on x-ray of hip. Patient had no significant electrolyte abnormality noted. Lactic acid was not significantly elevated. White blood cell count within normal limits. Patient's tachycardia improving with fluids and pain medication. Chest x-ray concerning for possible pneumonia. Urinalysis not consistent with urinary tract infection.  Due to possible pneumonia attempted to give patient Bactrim in setting of AIDS status. Patient reportedly has allergy to medication. The patient  has fever and tachycardia of unknown origin at this time. Possible pneumonia or catheter site infection. Additionally awaiting ultrasound right leg for possible DVT. Patient admitted to medicine at this time for further management of symptoms. Discussed case with Dr. Toney Sang team. Patient stable at time of admission and in agreement with plan.  Attending has seen and evaluated patient and Dr. Rex Kras is in agreement with plan.  Esaw Grandchild, MD 02/25/15 [place], MD 02/26/15 [place], MD 02/26/15 0730

## 2015-02-25 NOTE — Progress Notes (Signed)
Flu PCR negative. Discontinued droplet precautions. Dorthey Sawyer, RN

## 2015-02-25 NOTE — Progress Notes (Signed)
Admission note:  Arrival Method: patient arrived from ED on a stretcher. Mental Orientation:  Alert and oriented x 4. Telemetry: Tele [id number] box, verified by two nurses, NSR 104. Assessment: See doc flow sheets. Skin: Warm, dry and flaky, healed incision noted on the right thigh, assessed by two nurses Abigail Butts). IV: Right forearm NS 50 ml/hr. Pain: 6/10 in right thigh, administered oxy 5mg . Tubes: N/A Safety Measures: Call light and phone within reach, bed in low position. Fall Prevention Safety Plan: Reviewed fall prevention safety plan, understood and acknowledged. Admission Screening: IN progress. 6700 Orientation: Patient has been oriented to the unit, staff and to the room.

## 2015-02-25 NOTE — Progress Notes (Signed)
CRITICAL VALUE ALERT  Critical value received:  hgb = 6.7  Date of notification:  02/25/15  Time of notification:  1939  Critical value read back:Yes.    Nurse who received alert:  Ulice Dash, RN  MD notified (1st page):  Raliegh Ip Schorr  Time of first page:  1939  Responding MD:  Lamar Blinks  , New order for type and screen  Time MD responded:  1940

## 2015-02-25 NOTE — Progress Notes (Signed)
Preliminary results by tech - Right Lower Ext. Venous Duplex Completed. Negative for deep and superficial vein thrombosis in the right leg.  Susanna Benge, BS, RDMS, RVT  

## 2015-02-26 DIAGNOSIS — J189 Pneumonia, unspecified organism: Secondary | ICD-10-CM

## 2015-02-26 DIAGNOSIS — A419 Sepsis, unspecified organism: Principal | ICD-10-CM

## 2015-02-26 LAB — CBC
HCT: 23.8 % — ABNORMAL LOW (ref 39.0–52.0)
Hemoglobin: 7.7 g/dL — ABNORMAL LOW (ref 13.0–17.0)
MCH: 28.5 pg (ref 26.0–34.0)
MCHC: 32.4 g/dL (ref 30.0–36.0)
MCV: 88.1 fL (ref 78.0–100.0)
Platelets: 118 10*3/uL — ABNORMAL LOW (ref 150–400)
RBC: 2.7 MIL/uL — ABNORMAL LOW (ref 4.22–5.81)
RDW: 14.5 % (ref 11.5–15.5)
WBC: 8.6 10*3/uL (ref 4.0–10.5)

## 2015-02-26 LAB — COMPREHENSIVE METABOLIC PANEL
ALT: 16 U/L — ABNORMAL LOW (ref 17–63)
AST: 32 U/L (ref 15–41)
Albumin: 2.2 g/dL — ABNORMAL LOW (ref 3.5–5.0)
Alkaline Phosphatase: 34 U/L — ABNORMAL LOW (ref 38–126)
Anion gap: 7 (ref 5–15)
BUN: 16 mg/dL (ref 6–20)
CO2: 22 mmol/L (ref 22–32)
Calcium: 8.5 mg/dL — ABNORMAL LOW (ref 8.9–10.3)
Chloride: 105 mmol/L (ref 101–111)
Creatinine, Ser: 5.42 mg/dL — ABNORMAL HIGH (ref 0.61–1.24)
GFR calc Af Amer: 15 mL/min — ABNORMAL LOW (ref >60)
GFR calc non Af Amer: 13 mL/min — ABNORMAL LOW (ref >60)
Glucose, Bld: 73 mg/dL (ref 65–99)
Potassium: 3.7 mmol/L (ref 3.5–5.1)
Sodium: 134 mmol/L — ABNORMAL LOW (ref 135–145)
Total Bilirubin: 0.8 mg/dL (ref 0.3–1.2)
Total Protein: 7.6 g/dL (ref 6.5–8.1)

## 2015-02-26 LAB — URINE CULTURE: Culture: NO GROWTH

## 2015-02-26 MED ORDER — HYDRALAZINE HCL 20 MG/ML IJ SOLN
10.0000 mg | Freq: Once | INTRAMUSCULAR | Status: AC
  Administered 2015-02-26: 10 mg via INTRAVENOUS
  Filled 2015-02-26: qty 1

## 2015-02-26 MED ORDER — ONDANSETRON HCL 4 MG/2ML IJ SOLN
4.0000 mg | Freq: Four times a day (QID) | INTRAMUSCULAR | Status: DC | PRN
  Administered 2015-02-26: 4 mg via INTRAVENOUS
  Filled 2015-02-26: qty 2

## 2015-02-26 NOTE — Progress Notes (Signed)
Utilization Review Completed.Donne Anon T12/30/2016

## 2015-02-26 NOTE — Progress Notes (Addendum)
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 Today was brought to the hospital after patient was found to have fever during the session of hemodialysis. Patient was given vancomycin and sent to the ED for further evaluation. Patient was found to be tachycardic and febrile. Code sepsis was called. Patient was given 1 L normal saline bolus blood pressure has now improved, was not given 30 ml/kg fluid bolus due to the ESRD. As patient complained of right thigh pain CT scan of the right leg was done which was negative for any significant abnormality. Lower extremity venous duplex was done which was negative for DVT. Patient only complains of cough with yellow-colored phlegm which started this morning before he went for hemodialysis. He denies chest pain. He also had shortness of breath this morning. Denies nausea vomiting or diarrhea. Patient has history of AIDS with last CD4 count of 20 , and is followed by infectious disease as outpatient. Currently patient is on antiretroviral therapy, dapsone for PCP prophylaxis.  Patient currently not hypoxic, not requiring oxygen O2 sats 99 percent on room air  Assessment/Plan  Sepsis due to possible community acquired pneumonia, felt well this morning, but then started vomiting this afternoon.  CXR with RUL infiltrate vs. atelectasis -  If he develops diarrhea, check GI pathogen panel -  Continue levofloxacin -  Repeat CXR in AM -  Has right chest HD catheter, but no signs of surrounding infection  Nausea  and vomiting -  May be due to side effect from medication -  Could suggest gastritis/gastroenteritis -  Supportive care  AIDS  patient has CD4 50 and VL 114 123 on 01/11/2015 -  Continue antiretroviral therapy -  Dapsone for PCP prophylaxis -  Consider azithromycin for MAC prophylaxis once levofloxacin course complete  ESRD patient is on hemodialysis.  Hypokalemia, resolved  Right leg pain patient had I& of right thigh abscess in October. CT scan of the femur/thigh shows no significant abnormality, and shows resolution of the soft tissue swelling noted on prior CT.  Duplex negative.     Hypertension  Continue Coreg 12.5 m twice a day  Diet:  renal Access:  PIV IVF:  yes Proph:  heparin  Code Status: full Family Communication: patient alone Disposition Plan: pending blood cultures negative, tolerating diet, possibly discharge tomorrow if fevers trending down   Consultants:  Nephrology for HD  Procedures:  CT right thigh, duplex right lower extremity   Antibiotics:  Levofloxacin 12/29 >   HPI/Subjective:  This morning, he stated that he felt well.  He had had some shortness fo breath at the time of admission, but denies SOB, cough, sputum production today.  Denied nausea, vomiting, diarrhea.  THis afternoon, he developed nausea adn vomiting and felt unwell.     Objective: Filed Vitals:   02/26/15 0128 02/26/15 0406 02/26/15 0610 02/26/15 1000  BP: 148/103 168/103 154/91 154/98  Pulse: 100 94 99 102  Temp: 99.2 F (37.3 C) 98.5 F (36.9 C) 98.5 F (36.9 C) 99.3 F (37.4 C)  TempSrc: Oral Oral  Oral  Resp: 16  20 18   Height:      Weight:  SpO2: 99% 100% 100% 99%    Intake/Output Summary (Last 24 hours) at 02/26/15 1713 Last data filed at 02/26/15 1300  Gross per 24 hour  Intake 1196.67 ml  Output    450 ml  Net 746.67 ml   Filed Weights   02/25/15 1240  Weight: 75.751 kg (167 lb)   Body mass index is 26.15 kg/(m^2).  Exam:   General:   Adult male, No acute distress  HEENT:  NCAT, MMM  Cardiovascular:  RRR, nl S1, S2 2/6 systolic murmur, 2+ pulses, warm extremities.  Right chest HD catheter site c/d/i without induration, erythema, or purulence  Respiratory:  CTAB, no increased WOB  Abdomen:   NABS, soft, NT/ND  MSK:   Normal tone and bulk, no LEE  Neuro:  Grossly intact  Data Reviewed: Basic Metabolic Panel:  Recent Labs Lab 02/25/15 1215 02/25/15 1843 02/26/15 0810  NA 133*  --  134*  K 3.1*  --  3.7  CL 97*  --  105  CO2 27  --  22  GLUCOSE 84  --  73  BUN 7  --  16  CREATININE 3.15* 4.12* 5.42*  CALCIUM 8.4*  --  8.5*   Liver Function Tests:  Recent Labs Lab 02/25/15 1215 02/26/15 0810  AST 35 32  ALT 16* 16*  ALKPHOS 37* 34*  BILITOT 0.7 0.8  PROT 8.5* 7.6  ALBUMIN 2.6* 2.2*   No results for input(s): LIPASE, AMYLASE in the last 168 hours. No results for input(s): AMMONIA in the last 168 hours. CBC:  Recent Labs Lab 02/25/15 1215 02/25/15 1843 02/26/15 0810  WBC 8.8 8.9 8.6  NEUTROABS 6.6  --   --   HGB 7.9* 6.7* 7.7*  HCT 23.9* 20.5* 23.8*  MCV 88.5 87.6 88.1  PLT 127* 113* 118*    Recent Results (from the past 240 hour(s))  Culture, blood (routine x 2)     Status: None (Preliminary result)   Collection Time: 02/25/15 12:15 PM  Result Value Ref Range Status   Specimen Description BLOOD LEFT FOREARM  Final   Special Requests BOTTLES DRAWN AEROBIC AND ANAEROBIC 5MLS  Final   Culture NO GROWTH < 24 HOURS  Final   Report Status PENDING  Incomplete  Urine culture     Status: None   Collection Time: 02/25/15 12:51 PM  Result Value Ref Range Status   Specimen Description URINE, CLEAN CATCH  Final   Special Requests NONE  Final   Culture NO GROWTH 1 DAY  Final   Report Status 02/26/2015 FINAL  Final  Culture, blood (routine x 2)     Status: None (Preliminary result)   Collection Time: 02/25/15  1:40 PM  Result Value Ref Range Status   Specimen Description BLOOD LEFT HAND   Final   Special Requests BOTTLES DRAWN AEROBIC AND ANAEROBIC 5MLS  Final   Culture NO GROWTH < 24 HOURS  Final   Report Status PENDING  Incomplete     Studies: Dg Chest 2 View  02/25/2015  CLINICAL DATA:  Shortness of breath. EXAM: CHEST  2 VIEW COMPARISON:  January 12, 2015. FINDINGS: The heart size and mediastinal contours are within normal limits. No pneumothorax or pleural effusion is noted. Interval placement of right internal jugular dialysis catheter is noted with distal tip in expected position of SVC. Left lung is clear. New right upper lobe airspace opacity is noted concerning for possible pneumonia. The visualized skeletal structures are unremarkable. IMPRESSION: New right upper lobe airspace  opacity is noted concerning for pneumonia or possibly atelectasis. Electronically Signed   By: Marijo Conception, M.D.   On: 02/25/2015 13:20   Ct Femur Right Wo Contrast  02/25/2015  CLINICAL DATA:  Pain in the mid calf.  History of myositis. EXAM: CT OF THE RIGHT FEMUR WITHOUT CONTRAST TECHNIQUE: Multidetector CT imaging was performed according to the standard protocol. Multiplanar CT image reconstructions were also generated. COMPARISON:  CT scan dated 12/18/2014 and radiographs dated 02/25/2015 and 12/22/2014 FINDINGS: The soft tissue swelling and edema seen in the anterior musculature of the right thigh on the prior CT scan of 12/18/2014 has completely resolved. The muscles of the thigh appear normal. The fracture of the mid femur is completely healed and unchanged. There is no knee joint effusion or hip joint effusion. IMPRESSION: No acute abnormalities of right femur or of the soft tissues of the right thigh. Resolution of the soft tissue swelling noted on the prior CT scan of 12/18/2014. Electronically Signed   By: Lorriane Shire M.D.   On: 02/25/2015 14:12   Dg Hip Unilat With Pelvis 2-3 Views Right  02/25/2015  CLINICAL DATA:  Right pelvic and hip pain, no known injury, initial encounter  EXAM: DG HIP (WITH OR WITHOUT PELVIS) 2-3V RIGHT COMPARISON:  12/18/2014 FINDINGS: There again noted changes consistent with prior medullary rod placement. Healing femoral fracture is again noted. No acute fracture or dislocation is seen. IMPRESSION: Healing right femoral fracture following ORIF. No acute abnormality is noted. Electronically Signed   By: Inez Catalina M.D.   On: 02/25/2015 13:20    Scheduled Meds: . sodium chloride   Intravenous Once  . abacavir  600 mg Oral Daily  . carvedilol  12.5 mg Oral BID  . dapsone  100 mg Oral Daily  . darunavir-cobicistat  1 tablet Oral Daily  . dolutegravir  50 mg Oral Daily  . heparin subcutaneous  5,000 Units Subcutaneous 3 times per day  . [START ON 02/28/2015] levofloxacin (LEVAQUIN) IV  500 mg Intravenous Q48H   Continuous Infusions: . sodium chloride 50 mL/hr at 02/26/15 [id number]    Active Problems:   Sepsis ([place])   CAP (community acquired pneumonia)    Time spent: 30 min    Jonavon Trieu, [facility] Pager [phone]. If 7PM-7AM, please contact night-coverage at [email or web address], password [facility] 02/26/2015, 5:13 PM  LOS: 1 day

## 2015-02-26 NOTE — Progress Notes (Signed)
Pt with elevated B/P of 168/103.  Remains asymptomatic. Page to K. Schorr for notification. Dorthey Sawyer, RN

## 2015-02-27 ENCOUNTER — Inpatient Hospital Stay (HOSPITAL_COMMUNITY): Payer: PRIVATE HEALTH INSURANCE

## 2015-02-27 DIAGNOSIS — B20 Human immunodeficiency virus [HIV] disease: Secondary | ICD-10-CM

## 2015-02-27 DIAGNOSIS — D631 Anemia in chronic kidney disease: Secondary | ICD-10-CM

## 2015-02-27 DIAGNOSIS — N186 End stage renal disease: Secondary | ICD-10-CM

## 2015-02-27 DIAGNOSIS — Z992 Dependence on renal dialysis: Secondary | ICD-10-CM

## 2015-02-27 DIAGNOSIS — D696 Thrombocytopenia, unspecified: Secondary | ICD-10-CM

## 2015-02-27 LAB — RENAL FUNCTION PANEL
Albumin: 2.2 g/dL — ABNORMAL LOW (ref 3.5–5.0)
Anion gap: 7 (ref 5–15)
BUN: 24 mg/dL — ABNORMAL HIGH (ref 6–20)
CO2: 23 mmol/L (ref 22–32)
Calcium: 8.7 mg/dL — ABNORMAL LOW (ref 8.9–10.3)
Chloride: 105 mmol/L (ref 101–111)
Creatinine, Ser: 7.86 mg/dL — ABNORMAL HIGH (ref 0.61–1.24)
GFR calc Af Amer: 9 mL/min — ABNORMAL LOW (ref >60)
GFR calc non Af Amer: 8 mL/min — ABNORMAL LOW (ref >60)
Glucose, Bld: 88 mg/dL (ref 65–99)
Phosphorus: 3.1 mg/dL (ref 2.5–4.6)
Potassium: 3.9 mmol/L (ref 3.5–5.1)
Sodium: 135 mmol/L (ref 135–145)

## 2015-02-27 LAB — TYPE AND SCREEN
ABO/RH(D): O POS
Antibody Screen: NEGATIVE
Unit division: 0

## 2015-02-27 LAB — CBC
HCT: 23.6 % — ABNORMAL LOW (ref 39.0–52.0)
Hemoglobin: 7.6 g/dL — ABNORMAL LOW (ref 13.0–17.0)
MCH: 28.3 pg (ref 26.0–34.0)
MCHC: 32.2 g/dL (ref 30.0–36.0)
MCV: 87.7 fL (ref 78.0–100.0)
Platelets: 159 10*3/uL (ref 150–400)
RBC: 2.69 MIL/uL — ABNORMAL LOW (ref 4.22–5.81)
RDW: 14.7 % (ref 11.5–15.5)
WBC: 6.3 10*3/uL (ref 4.0–10.5)

## 2015-02-27 MED ORDER — RENA-VITE PO TABS: 1.0000 | ORAL_TABLET | Freq: Every day | ORAL | Status: DC

## 2015-02-27 MED ORDER — SODIUM CHLORIDE 0.9 % IV SOLN: 100.0000 mL | INTRAVENOUS | Status: DC | PRN

## 2015-02-27 MED ORDER — PROMETHAZINE HCL 12.5 MG PO TABS: 12.5000 mg | ORAL_TABLET | Freq: Four times a day (QID) | ORAL | Status: DC | PRN

## 2015-02-27 MED ORDER — LIDOCAINE-PRILOCAINE 2.5-2.5 % EX CREA: 1.0000 "application " | TOPICAL_CREAM | CUTANEOUS | Status: DC | PRN

## 2015-02-27 MED ORDER — PENTAFLUOROPROP-TETRAFLUOROETH EX AERO: 1.0000 "application " | INHALATION_SPRAY | CUTANEOUS | Status: DC | PRN

## 2015-02-27 MED ORDER — LEVOFLOXACIN 500 MG PO TABS: 500.0000 mg | ORAL_TABLET | ORAL | Status: DC

## 2015-02-27 MED ORDER — NEPRO/CARBSTEADY PO LIQD: 237.0000 mL | Freq: Two times a day (BID) | ORAL | Status: DC

## 2015-02-27 MED ORDER — NA FERRIC GLUC CPLX IN SUCROSE 12.5 MG/ML IV SOLN
125.0000 mg | Freq: Once | INTRAVENOUS | Status: AC
  Administered 2015-02-27: 125 mg via INTRAVENOUS
  Filled 2015-02-27: qty 10

## 2015-02-27 MED ORDER — OXYCODONE HCL 5 MG PO TABS
ORAL_TABLET | ORAL | Status: AC
  Filled 2015-02-27: qty 1

## 2015-02-27 MED ORDER — HEPARIN SODIUM (PORCINE) 1000 UNIT/ML DIALYSIS: 1000.0000 [IU] | INTRAMUSCULAR | Status: DC | PRN

## 2015-02-27 MED ORDER — ALTEPLASE 2 MG IJ SOLR: 2.0000 mg | Freq: Once | INTRAMUSCULAR | Status: DC | PRN

## 2015-02-27 MED ORDER — LIDOCAINE HCL (PF) 1 % IJ SOLN: 5.0000 mL | INTRAMUSCULAR | Status: DC | PRN

## 2015-02-27 MED ORDER — LEVOFLOXACIN 500 MG PO TABS: 750.0000 mg | ORAL_TABLET | ORAL | Status: DC

## 2015-02-27 NOTE — Consult Note (Signed)
[facility] Renal Consultation Note    Indication for Consultation:  Management of ESRD/hemodialysis; anemia, hypertension/volume and secondary hyperparathyroidism PCP: [name], MD  HPI: Jim Wyatt is a 34 y.o. male with ESRD, HTN, HIV, hx DVT admitted with sepsis/CAP.  Had fever at dialysis 12/29 and sent to the hospital. CXR showed RUL infiltrate.  A CT of the right thigh was due due to pain which was negative. He was treated with sepsis protocol.  BC have been negative to date.  He will be d/c later today following dialysis. Tmax was 99.3. He felt worse before HD but worsened during treatment.  He primary symptom was a cough. He has only been on HD for several weeks and does not yet have a permanent access.  One is planned for next week. He denies SOB, edema, N, V, D.  He has had some cramps during his HD treatments.  He is due for HD today.   Past Medical History  Diagnosis Date  . Shingles < 2010  . HIV infection ([place]) dx'd 10/2013  . Cellulitis 11/2013    LLE  . CAP (community acquired pneumonia) 10/2013  . DVT (deep venous thrombosis) ([place]) 10/2013    BLE  . Multiple food allergies   . Multiple environmental allergies     "trees, dogs, cats"  . Hypertension   . Fever   . Leg pain   . Chronic kidney disease   . 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 Femur fracture surgery Right 09/2001  . Fracture surgery    . I&d extremity Right 12/18/2014    Procedure: IRRIGATION AND DEBRIDEMENT RIGHT LOWER EXTREMITY;  Surgeon: Ralene Ok, MD;  Location: [place];  Service: General;  Laterality: Right;  . Insertion of dialysis catheter  2016   Family History  Problem Relation Age of Onset  . Diabetes Maternal Grandmother   . Kidney disease Maternal Grandmother    Social History:  reports that he has never smoked. He has never used smokeless tobacco. He reports that he drinks alcohol. He reports that he  does not use illicit drugs. Allergies  Allergen Reactions  . Bactrim [Sulfamethoxazole-Trimethoprim] Other (See Comments)    Renal failure  . Cauliflower [Brassica Oleracea Italica] Anaphylaxis and Other (See Comments)    Pt states his throat closes up  . Vancomycin Other (See Comments)    Patient states vancomycin caused kidney injury  . Amlodipine Palpitations    fatigue  . Augmentin [Amoxicillin-Pot Clavulanate] Other (See Comments)    unknown  . Banana Other (See Comments)  . Fish Allergy Hives  . Onion Rash   Prior to Admission medications   Medication Sig Start Date End Date Taking? Authorizing Provider  abacavir (ZIAGEN) 300 MG tablet Take 2 tablets (600 mg total) by mouth daily. 08/26/14  Yes Carlyle Basques, MD  albuterol (PROVENTIL HFA;VENTOLIN HFA) 108 (90 BASE) MCG/ACT inhaler Inhale 2 puffs into the lungs every 6 (six) hours as needed for wheezing or shortness of breath. 08/02/14  Yes Costin Karlyne Greenspan, MD  carvedilol (COREG) 12.5 MG tablet Take 12.5 mg by mouth 2 (two) times daily. 01/28/15  Yes Historical Provider, MD  dapsone 100 MG tablet Take 1 tablet (100 mg total) by mouth daily. 08/02/14  Yes Costin Karlyne Greenspan, MD  darunavir-cobicistat (PREZCOBIX) 800-150 MG per tablet Take 1 tablet by mouth daily. Swallow whole. Do NOT crush, break or chew tablets. Take with food. 08/26/14  Yes Carlyle Basques,  MD  dolutegravir (TIVICAY) 50 MG tablet Take 1 tablet (50 mg total) by mouth daily. 08/26/14  Yes Carlyle Basques, MD  ondansetron (ZOFRAN) 4 MG tablet 30 minutes before her meds as needed for nausea Patient taking differently: Take 4 mg by mouth every 8 (eight) hours as needed for nausea or vomiting. 30 minutes before her meds as needed for nausea 10/12/14  Yes Michel Bickers, MD  oxyCODONE (OXY IR/ROXICODONE) 5 MG immediate release tablet Take 1 tablet (5 mg total) by mouth every 4 (four) hours as needed for moderate pain. 12/23/14  Yes Geradine Girt, DO  levofloxacin (LEVAQUIN) 500 MG  tablet Take 1.5 tablets (750 mg total) by mouth every other day. 02/28/15   Janece Canterbury, MD  promethazine (PHENERGAN) 12.5 MG tablet Take 1 tablet (12.5 mg total) by mouth every 6 (six) hours as needed for nausea or vomiting (and 30 minutes prior to taking antibiotics). 02/27/15   Janece Canterbury, MD   Current Facility-Administered Medications  Medication Dose Route Frequency Provider Last Rate Last Dose  . 0.9 %  sodium chloride infusion   Intravenous Continuous Oswald Hillock, MD 50 mL/hr at 02/26/15 0427    . 0.9 %  sodium chloride infusion   Intravenous Once Jeryl [place], NP   Stopped at 02/25/15 2045  . abacavir (ZIAGEN) tablet 600 mg  600 mg Oral Daily Oswald Hillock, MD   600 mg at 02/26/15 1030  . acetaminophen (TYLENOL) tablet 650 mg  650 mg Oral Q4H PRN Rhetta Mura Schorr, NP   650 mg at 02/25/15 2200  . carvedilol (COREG) tablet 12.5 mg  12.5 mg Oral BID Oswald Hillock, MD   12.5 mg at 02/27/15 1096  . dapsone tablet 100 mg  100 mg Oral Daily Oswald Hillock, MD   100 mg at 02/26/15 1030  . darunavir-cobicistat (PREZCOBIX) 800-150 MG per tablet 1 tablet  1 tablet Oral Daily Oswald Hillock, MD   1 tablet at 02/26/15 1031  . dolutegravir (TIVICAY) tablet 50 mg  50 mg Oral Daily Oswald Hillock, MD   50 mg at 02/26/15 1030  . feeding supplement (NEPRO CARB STEADY) liquid 237 mL  237 mL Oral BID BM Alric Seton, PA-C   237 mL at 02/27/15 0916  . heparin injection 5,000 Units  5,000 Units Subcutaneous 3 times per day Oswald Hillock, MD   5,000 Units at 02/27/15 [phone]  . [START ON 02/28/2015] levofloxacin (LEVAQUIN) IVPB 500 mg  500 mg Intravenous Q48H Rebecka Apley, RPH      . multivitamin (RENA-VIT) tablet 1 tablet  1 tablet Oral QHS Alric Seton, PA-C      . ondansetron [facility]) injection 4 mg  4 mg Intravenous Q6H PRN Janece Canterbury, MD   4 mg at 02/26/15 1422  . oxyCODONE (Oxy IR/ROXICODONE) immediate release tablet 5 mg  5 mg Oral Q4H PRN Oswald Hillock, MD   5 mg at 02/26/15 2249    Labs: Basic Metabolic Panel:  Recent Labs Lab 02/25/15 1215 02/25/15 1843 02/26/15 0810  NA 133*  --  134*  K 3.1*  --  3.7  CL 97*  --  105  CO2 27  --  22  GLUCOSE 84  --  73  BUN 7  --  16  CREATININE 3.15* 4.12* 5.42*  CALCIUM 8.4*  --  8.5*   Liver Function Tests:  Recent Labs Lab 02/25/15 1215 02/26/15 0810  AST 35 32  ALT 16* 16*  ALKPHOS 37* 34*  BILITOT 0.7 0.8  PROT 8.5* 7.6  ALBUMIN 2.6* 2.2*   CBC:  Recent Labs Lab 02/25/15 1215 02/25/15 1843 02/26/15 0810  WBC 8.8 8.9 8.6  NEUTROABS 6.6  --   --   HGB 7.9* 6.7* 7.7*  HCT 23.9* 20.5* 23.8*  MCV 88.5 87.6 88.1  PLT 127* 113* 118*  Studies/Results: Dg Chest 2 View  02/25/2015  CLINICAL DATA:  Shortness of breath. EXAM: CHEST  2 VIEW COMPARISON:  January 12, 2015. FINDINGS: The heart size and mediastinal contours are within normal limits. No pneumothorax or pleural effusion is noted. Interval placement of right internal jugular dialysis catheter is noted with distal tip in expected position of SVC. Left lung is clear. New right upper lobe airspace opacity is noted concerning for possible pneumonia. The visualized skeletal structures are unremarkable. IMPRESSION: New right upper lobe airspace opacity is noted concerning for pneumonia or possibly atelectasis. Electronically Signed   By: Marijo Conception, M.D.   On: 02/25/2015 13:20   Ct Femur Right Wo Contrast  02/25/2015  CLINICAL DATA:  Pain in the mid calf.  History of myositis. EXAM: CT OF THE RIGHT FEMUR WITHOUT CONTRAST TECHNIQUE: Multidetector CT imaging was performed according to the standard protocol. Multiplanar CT image reconstructions were also generated. COMPARISON:  CT scan dated 12/18/2014 and radiographs dated 02/25/2015 and 12/22/2014 FINDINGS: The soft tissue swelling and edema seen in the anterior musculature of the right thigh on the prior CT scan of 12/18/2014 has completely resolved. The muscles of the thigh appear normal. The  fracture of the mid femur is completely healed and unchanged. There is no knee joint effusion or hip joint effusion. IMPRESSION: No acute abnormalities of right femur or of the soft tissues of the right thigh. Resolution of the soft tissue swelling noted on the prior CT scan of 12/18/2014. Electronically Signed   By: Lorriane Shire M.D.   On: 02/25/2015 14:12   Dg Chest Port 1 View  02/27/2015  CLINICAL DATA:  Pneumonia. EXAM: PORTABLE CHEST 1 VIEW COMPARISON:  February 25, 2015. FINDINGS: The heart size and mediastinal contours are within normal limits. No pneumothorax or pleural effusion is noted. Right internal jugular dialysis catheter is again noted with distal tip in expected position of the SVC. Left lung is clear. Stable right upper lobe airspace opacity is noted concerning for pneumonia or atelectasis. The visualized skeletal structures are unremarkable. IMPRESSION: Stable right upper lobe airspace opacity is noted concerning for pneumonia or atelectasis. Electronically Signed   By: Marijo Conception, M.D.   On: 02/27/2015 08:42   Dg Hip Unilat With Pelvis 2-3 Views Right  02/25/2015  CLINICAL DATA:  Right pelvic and hip pain, no known injury, initial encounter EXAM: DG HIP (WITH OR WITHOUT PELVIS) 2-3V RIGHT COMPARISON:  12/18/2014 FINDINGS: There again noted changes consistent with prior medullary rod placement. Healing femoral fracture is again noted. No acute fracture or dislocation is seen. IMPRESSION: Healing right femoral fracture following ORIF. No acute abnormality is noted. Electronically Signed   By: Inez Catalina M.D.   On: 02/25/2015 13:20    ROS: As per HPI otherwise negative. Physical Exam: Filed Vitals:   02/26/15 1723 02/26/15 2144 02/27/15 0505 02/27/15 0824  BP: 146/96 155/93 155/99 168/87  Pulse: 94 93 89 91  Temp: 98.9 F (37.2 C) 99.2 F (37.3 C) 98.8 F (37.1 C) 98.2 F (36.8 C)  TempSrc: Oral  Oral Oral  Resp: '17 18 20 18  ' Height:  Weight:  75.751 kg (167  lb)    SpO2: 100% 100% 100% 100%     General: Well developed, well nourished young AAM in no acute distress. Head: Normocephalic, atraumatic, sclera non-icteric, mucus membranes are moist Neck: Supple. JVD not elevated. Lungs: Crackels left base Breathing is unlabored. Heart: RRR with S1 S2. No murmurs, rubs, or gallops appreciated. Abdomen: Soft, non-tender, non-distended with normoactive bowel sounds. M-S:  Strength and tone appear normal for age. Lower extremities:without edema or ischemic changes, no open wounds  Neuro: Alert and oriented X 3. Moves all extremities spontaneously. Psych:  Responds to questions appropriately with a normal affect. Dialysis Access:right IJ  Dialysis Orders: relatively new start at [facility] 4.25 EDW 76 but leaving below edw 2k 2.5 ca [name] 400/A 1.5 Mircera 200 12/27 venofer 100 q HD no VDRA  Assessment/Plan: 1.  Sepsis due to possible CAP - RUL infiltrate per CXR  BC no growth todate. -to complete a 7 day course of levaquin.ESR 122; fol;low up BC after d/c 2.  ESRD -  TTS - HD today prior to discharge 3.  Hypertension/volume  - continue to lower EDW/volume 4.  Anemia  - continue Fe - had ESA this week - Hgb 6.7 on admission up to 7.7 post transfusion 1 unit yesterday 5.  Metabolic bone disease -  Not on VDRA yet or binders - outpt P was 5.6 with iPTH 261- will check with SW at outpt unit to see what binders his insurance covers and start this after discharge. 6.  Nutrition -renal diet 7.  HIV/AIDS- CDr 50 12/2014 - current meds  Myriam Jacobson, PA-C [facility] [phone] 02/27/2015, 11:39 AM   Pt seen, examined and agree w A/P as above. ESRD patient with hx of HIV, shingles, CAP, DVT and HTN.  Presented with fever during last HD, given IV abx and sent to ED 12/29. Had pulm infiltrate on CXR and admitted for pneumonia. Today fevers are down and plan is for dc home. Asked to see for HD today prior to dc.  Kelly Splinter MD [employer] pager [phone]    cell [phone] 02/27/2015, 1:37 PM

## 2015-02-27 NOTE — Discharge Summary (Addendum)
Physician Discharge Summary  Jim Wyatt [id number] DOB: Feb 07, 1981 DOA: 02/25/2015  PCP: [name], MD  Admit date: 02/25/2015 Discharge date: 02/27/2015  Recommendations for Outpatient Follow-up:  1. F/u with PCP in 1-2 weeks or sooner if needed.  Repeat CXR in 3-4 weeks to ensure resolution of pneumonia. 2. Complete 7-day course of levofloxacin  Discharge Diagnoses:  Principal Problem:   Sepsis ([place]) Active Problems:   Protein-calorie malnutrition, severe ([place])   Anemia of chronic disease   Thrombocytopenia ([place])   AIDS ([place])   End stage renal disease ([place])   CAP (community acquired pneumonia)   Discharge Condition: stable, improved  Diet recommendation: renal dialysis  Wt Readings from Last 3 Encounters:  02/26/15 75.751 kg (167 lb)  02/16/15 78.291 kg (172 lb 9.6 oz)  01/12/15 80.8 kg (178 lb 2.1 oz)    History of present illness:   34 year old male who has a past medical history of Shingles (< 2010); AIDS ([place]) (dx'd 10/2013) CD4 50 and VL 114 123 on 01/11/2015, compliant with ART and dapsone; Cellulitis (11/2013); CAP (community acquired pneumonia) (10/2013); DVT (deep venous thrombosis) ([place]) (10/2013); ESRD on HD, 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  He was brought to the hospital after he was found to have fever during the session of hemodialysis. He was given vancomycin and sent to the ED for further evaluation. Patient was tachycardic and febrile and was found to have a RUL pneumonia on  CXR.  He also complained of worsening right leg pain.  As patient complained of right thigh pain CT scan of the right leg was done which was negative for any significant abnormality. Lower extremity venous duplex was done which was negative for DVT.  Hospital Course:   Sepsis due to possible community acquired pneumonia. CXR with persistent RUL infiltrate.  Blood cultures were no growth to date. He was given levofloxacin after discussion  with infectious disease. He should complete a seven-day course of levofloxacin and was given pills to take. He will not need antibiotics at hemodialysis.  There are no signs of infection on the CT scan of his right leg. His right chest hemodialysis catheter also showed no signs of infection.  Nausea and vomiting, common side effects from antibiotics. He was able to tolerate food and drink.  AIDS.  CD4 50 and VL 114 123 on 01/11/2015. Continued antiretroviral therapy and dapsone for PCP prophylaxis. Follow-up with infectious disease. He is currently on levofloxacin but once he has completed this course, consider azithromycin for MAC prophylaxis.  ESRD, continued hemodialysis.  Hypokalemia, resolved With hemodialysis.   Right leg pain, patient had I& of right thigh abscess in October. CT scan of the femur/thigh shows no significant abnormality, and shows resolution of the soft tissue swelling noted on prior CT. Duplex  was negative.    Hypertension Stable, continued Coreg 12.5 m twice a day  Anemia of renal disease, received 1 unit transfusion during hospitalization.  Chronic intermittent thrombocytopenia, platelets remained greater than 100,000, no obvious bleeding.  Consultants:  Nephrology for HD  Procedures:  CT right thigh, duplex right lower extremity  Antibiotics:  Levofloxacin 12/29 >  Discharge Exam: Filed Vitals:   02/27/15 0505 02/27/15 0824  BP: 155/99 168/87  Pulse: 89 91  Temp: 98.8 F (37.1 C) 98.2 F (36.8 C)  Resp: 20 18   Filed Vitals:   02/26/15 1723 02/26/15 2144 02/27/15 0505 02/27/15 0824  BP: 146/96 155/93 155/99 168/87  Pulse: 94 93 89 91  Temp: 98.9 F (37.2  C) 99.2 F (37.3 C) 98.8 F (37.1 C) 98.2 F (36.8 C)  TempSrc: Oral  Oral Oral  Resp: 17 18 20 18   Height:      Weight:  75.751 kg (167 lb)    SpO2: 100% 100% 100% 100%     General: Adult male, No acute distress  HEENT: NCAT, MMM  Cardiovascular: RRR, nl S1, S2 Right  chest HD catheter site c/d/i without induration, erythema, or purulence  Respiratory: CTAB, no increased WOB  Abdomen: NABS, soft, NT/ND  MSK: Normal tone and bulk, no LEE  Neuro: Grossly intact  Discharge Instructions      Discharge Instructions    Call MD for:  difficulty breathing, headache or visual disturbances    Complete by:  As directed      Call MD for:  extreme fatigue    Complete by:  As directed      Call MD for:  hives    Complete by:  As directed      Call MD for:  persistant dizziness or light-headedness    Complete by:  As directed      Call MD for:  persistant nausea and vomiting    Complete by:  As directed      Call MD for:  severe uncontrolled pain    Complete by:  As directed      Call MD for:  temperature >100.4    Complete by:  As directed      Diet - low sodium heart healthy    Complete by:  As directed      Discharge instructions    Complete by:  As directed   Please continue levofloxacin, your next dose is due tomorrow morning and your last dose is due Tuesday morning.  Take phenergan about 30 minutes to an hour before you take your antibiotics.  If you develop new fevers, chills, shortness of breath, or any other concerning symptoms, please return to the hospital.     Increase activity slowly    Complete by:  As directed             Medication List    STOP taking these medications        ondansetron 4 MG tablet  Commonly known as:  ZOFRAN      TAKE these medications        abacavir 300 MG tablet  Commonly known as:  ZIAGEN  Take 2 tablets (600 mg total) by mouth daily.     albuterol 108 (90 Base) MCG/ACT inhaler  Commonly known as:  PROVENTIL HFA;VENTOLIN HFA  Inhale 2 puffs into the lungs every 6 (six) hours as needed for wheezing or shortness of breath.     carvedilol 12.5 MG tablet  Commonly known as:  COREG  Take 12.5 mg by mouth 2 (two) times daily.     dapsone 100 MG tablet  Take 1 tablet (100 mg total) by mouth  daily.     darunavir-cobicistat 800-150 MG tablet  Commonly known as:  PREZCOBIX  Take 1 tablet by mouth daily. Swallow whole. Do NOT crush, break or chew tablets. Take with food.  Notes to Patient:  Take this medicine with your next meal and then resume taking as prescribed     dolutegravir 50 MG tablet  Commonly known as:  TIVICAY  Take 1 tablet (50 mg total) by mouth daily.     levofloxacin 500 MG tablet  Commonly known as:  LEVAQUIN  Take 1 tablet (500  mg total) by mouth every other day.     oxyCODONE 5 MG immediate release tablet  Commonly known as:  Oxy IR/ROXICODONE  Take 1 tablet (5 mg total) by mouth every 4 (four) hours as needed for moderate pain.     promethazine 12.5 MG tablet  Commonly known as:  PHENERGAN  Take 1 tablet (12.5 mg total) by mouth every 6 (six) hours as needed for nausea or vomiting (and 30 minutes prior to taking antibiotics).       Follow-up Information    Follow up with [name], MD.   Specialty:  Internal Medicine   Contact information:   [place] [address] [phone]       Follow up with Carlyle Basques, MD. Schedule an appointment as soon as possible for a visit in 2 weeks.   Specialty:  Infectious Diseases   Contact information:   [address] [place] [address] [phone]        The results of significant diagnostics from this hospitalization (including imaging, microbiology, ancillary and laboratory) are listed below for reference.    Significant Diagnostic Studies: Dg Chest 2 View  02/25/2015  CLINICAL DATA:  Shortness of breath. EXAM: CHEST  2 VIEW COMPARISON:  January 12, 2015. FINDINGS: The heart size and mediastinal contours are within normal limits. No pneumothorax or pleural effusion is noted. Interval placement of right internal jugular dialysis catheter is noted with distal tip in expected position of SVC. Left lung is clear. New right upper lobe airspace opacity is  noted concerning for possible pneumonia. The visualized skeletal structures are unremarkable. IMPRESSION: New right upper lobe airspace opacity is noted concerning for pneumonia or possibly atelectasis. Electronically Signed   By: Marijo Conception, M.D.   On: 02/25/2015 13:20   Ct Femur Right Wo Contrast  02/25/2015  CLINICAL DATA:  Pain in the mid calf.  History of myositis. EXAM: CT OF THE RIGHT FEMUR WITHOUT CONTRAST TECHNIQUE: Multidetector CT imaging was performed according to the standard protocol. Multiplanar CT image reconstructions were also generated. COMPARISON:  CT scan dated 12/18/2014 and radiographs dated 02/25/2015 and 12/22/2014 FINDINGS: The soft tissue swelling and edema seen in the anterior musculature of the right thigh on the prior CT scan of 12/18/2014 has completely resolved. The muscles of the thigh appear normal. The fracture of the mid femur is completely healed and unchanged. There is no knee joint effusion or hip joint effusion. IMPRESSION: No acute abnormalities of right femur or of the soft tissues of the right thigh. Resolution of the soft tissue swelling noted on the prior CT scan of 12/18/2014. Electronically Signed   By: Lorriane Shire M.D.   On: 02/25/2015 14:12   Dg Chest Port 1 View  02/27/2015  CLINICAL DATA:  Pneumonia. EXAM: PORTABLE CHEST 1 VIEW COMPARISON:  February 25, 2015. FINDINGS: The heart size and mediastinal contours are within normal limits. No pneumothorax or pleural effusion is noted. Right internal jugular dialysis catheter is again noted with distal tip in expected position of the SVC. Left lung is clear. Stable right upper lobe airspace opacity is noted concerning for pneumonia or atelectasis. The visualized skeletal structures are unremarkable. IMPRESSION: Stable right upper lobe airspace opacity is noted concerning for pneumonia or atelectasis. Electronically Signed   By: Marijo Conception, M.D.   On: 02/27/2015 08:42   Dg Hip Unilat With Pelvis 2-3  Views Right  02/25/2015  CLINICAL DATA:  Right pelvic and hip  pain, no known injury, initial encounter EXAM: DG HIP (WITH OR WITHOUT PELVIS) 2-3V RIGHT COMPARISON:  12/18/2014 FINDINGS: There again noted changes consistent with prior medullary rod placement. Healing femoral fracture is again noted. No acute fracture or dislocation is seen. IMPRESSION: Healing right femoral fracture following ORIF. No acute abnormality is noted. Electronically Signed   By: Inez Catalina M.D.   On: 02/25/2015 13:20    Microbiology: Recent Results (from the past 240 hour(s))  Culture, blood (routine x 2)     Status: None (Preliminary result)   Collection Time: 02/25/15 12:15 PM  Result Value Ref Range Status   Specimen Description BLOOD LEFT FOREARM  Final   Special Requests BOTTLES DRAWN AEROBIC AND ANAEROBIC 5MLS  Final   Culture NO GROWTH 2 DAYS  Final   Report Status PENDING  Incomplete  Urine culture     Status: None   Collection Time: 02/25/15 12:51 PM  Result Value Ref Range Status   Specimen Description URINE, CLEAN CATCH  Final   Special Requests NONE  Final   Culture NO GROWTH 1 DAY  Final   Report Status 02/26/2015 FINAL  Final  Culture, blood (routine x 2)     Status: None (Preliminary result)   Collection Time: 02/25/15  1:40 PM  Result Value Ref Range Status   Specimen Description BLOOD LEFT HAND  Final   Special Requests BOTTLES DRAWN AEROBIC AND ANAEROBIC 5MLS  Final   Culture NO GROWTH 2 DAYS  Final   Report Status PENDING  Incomplete  Culture, blood (single)     Status: None (Preliminary result)   Collection Time: 02/26/15 11:30 AM  Result Value Ref Range Status   Specimen Description BLOOD RIGHT HAND  Final   Special Requests BOTTLES DRAWN AEROBIC AND ANAEROBIC 10CC  Final   Culture NO GROWTH < 24 HOURS  Final   Report Status PENDING  Incomplete     Labs: Basic Metabolic Panel:  Recent Labs Lab 02/25/15 1215 02/25/15 1843 02/26/15 0810  NA 133*  --  134*  K 3.1*  --  3.7   CL 97*  --  105  CO2 27  --  22  GLUCOSE 84  --  73  BUN 7  --  16  CREATININE 3.15* 4.12* 5.42*  CALCIUM 8.4*  --  8.5*   Liver Function Tests:  Recent Labs Lab 02/25/15 1215 02/26/15 0810  AST 35 32  ALT 16* 16*  ALKPHOS 37* 34*  BILITOT 0.7 0.8  PROT 8.5* 7.6  ALBUMIN 2.6* 2.2*   No results for input(s): LIPASE, AMYLASE in the last 168 hours. No results for input(s): AMMONIA in the last 168 hours. CBC:  Recent Labs Lab 02/25/15 1215 02/25/15 1843 02/26/15 0810  WBC 8.8 8.9 8.6  NEUTROABS 6.6  --   --   HGB 7.9* 6.7* 7.7*  HCT 23.9* 20.5* 23.8*  MCV 88.5 87.6 88.1  PLT 127* 113* 118*   Cardiac Enzymes: No results for input(s): CKTOTAL, CKMB, CKMBINDEX, TROPONINI in the last 168 hours. BNP: BNP (last 3 results)  Recent Labs  07/30/14 0350  BNP 28.7    ProBNP (last 3 results) No results for input(s): PROBNP in the last 8760 hours.  CBG: No results for input(s): GLUCAP in the last 168 hours.  Time coordinating discharge: 35 minutes  Signed:  Maryon Kemnitz  [facility] 02/27/2015, 12:59 PM

## 2015-02-27 NOTE — Progress Notes (Signed)
Pt bp 156/103, notified MD, no new orders, MD okay to proceed with discharge.

## 2015-02-27 NOTE — Progress Notes (Signed)
Jim Wyatt to be D/C'd Home per MD order.  Discussed prescriptions and follow up appointments with the patient. Prescriptions given to patient, medication list explained in detail. Pt verbalized understanding.    Medication List    STOP taking these medications        ondansetron 4 MG tablet  Commonly known as:  ZOFRAN      TAKE these medications        abacavir 300 MG tablet  Commonly known as:  ZIAGEN  Take 2 tablets (600 mg total) by mouth daily.     albuterol 108 (90 Base) MCG/ACT inhaler  Commonly known as:  PROVENTIL HFA;VENTOLIN HFA  Inhale 2 puffs into the lungs every 6 (six) hours as needed for wheezing or shortness of breath.     carvedilol 12.5 MG tablet  Commonly known as:  COREG  Take 12.5 mg by mouth 2 (two) times daily.     dapsone 100 MG tablet  Take 1 tablet (100 mg total) by mouth daily.     darunavir-cobicistat 800-150 MG tablet  Commonly known as:  PREZCOBIX  Take 1 tablet by mouth daily. Swallow whole. Do NOT crush, break or chew tablets. Take with food.  Notes to Patient:  Take this medicine with your next meal and then resume taking as prescribed     dolutegravir 50 MG tablet  Commonly known as:  TIVICAY  Take 1 tablet (50 mg total) by mouth daily.     levofloxacin 500 MG tablet  Commonly known as:  LEVAQUIN  Take 1 tablet (500 mg total) by mouth every other day.     oxyCODONE 5 MG immediate release tablet  Commonly known as:  Oxy IR/ROXICODONE  Take 1 tablet (5 mg total) by mouth every 4 (four) hours as needed for moderate pain.     promethazine 12.5 MG tablet  Commonly known as:  PHENERGAN  Take 1 tablet (12.5 mg total) by mouth every 6 (six) hours as needed for nausea or vomiting (and 30 minutes prior to taking antibiotics).        Filed Vitals:   02/27/15 1600 02/27/15 1622  BP: 160/89 156/103  Pulse: 88 84  Temp:  98.5 F (36.9 C)  Resp: 15 14    Skin clean, dry and intact without evidence of skin break down, no evidence  of skin tears noted. IV catheter discontinued intact. Site without signs and symptoms of complications. Dressing and pressure applied. Pt denies pain at this time. No complaints noted.  An After Visit Summary was printed and given to the patient. Patient escorted via [place], and D/C home via private auto.  [name] A 02/27/2015 6:26 PM

## 2015-03-02 ENCOUNTER — Encounter (HOSPITAL_COMMUNITY): Payer: Self-pay | Admitting: *Deleted

## 2015-03-02 LAB — CULTURE, BLOOD (ROUTINE X 2)
Culture: NO GROWTH
Culture: NO GROWTH

## 2015-03-02 LAB — HIV 1/2 AB DIFFERENTIATION
HIV 1 Ab: POSITIVE — AB
HIV 2 Ab: UNDETERMINED

## 2015-03-02 LAB — HIV ANTIBODY (ROUTINE TESTING W REFLEX)

## 2015-03-02 MED ORDER — SODIUM CHLORIDE 0.9 % IV SOLN
INTRAVENOUS | Status: DC
  Administered 2015-03-03 (×2): via INTRAVENOUS

## 2015-03-02 MED ORDER — CIPROFLOXACIN IN D5W 400 MG/200ML IV SOLN
400.0000 mg | INTRAVENOUS | Status: AC
  Administered 2015-03-03: 400 mg via INTRAVENOUS
  Filled 2015-03-02: qty 200

## 2015-03-02 MED ORDER — CHLORHEXIDINE GLUCONATE CLOTH 2 % EX PADS: 6.0000 | MEDICATED_PAD | Freq: Once | CUTANEOUS | Status: DC

## 2015-03-02 NOTE — Progress Notes (Signed)
Dr. Kalman Shan, Anesthesia, made aware of pt abnormal CXR and recent admission for PNA. Okay to repeat CXR on DOS.

## 2015-03-02 NOTE — Progress Notes (Signed)
Measure pt neck on DOS to complete Sleep Apnea screening.

## 2015-03-02 NOTE — Progress Notes (Signed)
Pt denies SOB, chest pain, and being under the care of a cardiologist. Pt denies having a stress test , echo and cardiac cath. Pt made aware to stop otc vitamins, herbal medications, and NSAID's. Pt verbalized understanding of all pre-op instructions.

## 2015-03-03 ENCOUNTER — Encounter (HOSPITAL_COMMUNITY): Payer: Self-pay | Admitting: Anesthesiology

## 2015-03-03 ENCOUNTER — Ambulatory Visit (HOSPITAL_COMMUNITY)
Admission: RE | Admit: 2015-03-03 | Discharge: 2015-03-03 | Disposition: A | Discharge status: Home / Self Care | Payer: BLUE CROSS/BLUE SHIELD | Source: Ambulatory Visit | Attending: Vascular Surgery | Admitting: Vascular Surgery

## 2015-03-03 ENCOUNTER — Other Ambulatory Visit: Payer: Self-pay | Admitting: *Deleted

## 2015-03-03 ENCOUNTER — Encounter (HOSPITAL_COMMUNITY)
Admission: RE | Disposition: A | Discharge status: Home / Self Care | Payer: Self-pay | Source: Ambulatory Visit | Attending: Vascular Surgery

## 2015-03-03 ENCOUNTER — Ambulatory Visit (HOSPITAL_COMMUNITY): Payer: BLUE CROSS/BLUE SHIELD | Admitting: Anesthesiology

## 2015-03-03 DIAGNOSIS — N186 End stage renal disease: Secondary | ICD-10-CM | POA: Insufficient documentation

## 2015-03-03 DIAGNOSIS — Z992 Dependence on renal dialysis: Secondary | ICD-10-CM | POA: Diagnosis not present

## 2015-03-03 DIAGNOSIS — Z4931 Encounter for adequacy testing for hemodialysis: Secondary | ICD-10-CM

## 2015-03-03 DIAGNOSIS — Z880 Allergy status to penicillin: Secondary | ICD-10-CM | POA: Insufficient documentation

## 2015-03-03 DIAGNOSIS — Z21 Asymptomatic human immunodeficiency virus [HIV] infection status: Secondary | ICD-10-CM | POA: Insufficient documentation

## 2015-03-03 DIAGNOSIS — I120 Hypertensive chronic kidney disease with stage 5 chronic kidney disease or end stage renal disease: Secondary | ICD-10-CM | POA: Diagnosis not present

## 2015-03-03 HISTORY — PX: AV FISTULA PLACEMENT: SHX1204

## 2015-03-03 HISTORY — DX: Headache: R51

## 2015-03-03 HISTORY — DX: Headache, unspecified: R51.9

## 2015-03-03 LAB — CULTURE, BLOOD (SINGLE): Culture: NO GROWTH

## 2015-03-03 LAB — POCT I-STAT 4, (NA,K, GLUC, HGB,HCT)
Glucose, Bld: 96 mg/dL (ref 65–99)
HCT: 31 % — ABNORMAL LOW (ref 39.0–52.0)
Hemoglobin: 10.5 g/dL — ABNORMAL LOW (ref 13.0–17.0)
Potassium: 3.2 mmol/L — ABNORMAL LOW (ref 3.5–5.1)
Sodium: 139 mmol/L (ref 135–145)

## 2015-03-03 SURGERY — ARTERIOVENOUS (AV) FISTULA CREATION
Anesthesia: Monitor Anesthesia Care | Site: Arm Lower | Laterality: Left

## 2015-03-03 MED ORDER — HEPARIN SODIUM (PORCINE) 5000 UNIT/ML IJ SOLN
INTRAMUSCULAR | Status: DC | PRN
  Administered 2015-03-03: 500 mL

## 2015-03-03 MED ORDER — FENTANYL CITRATE (PF) 250 MCG/5ML IJ SOLN
INTRAMUSCULAR | Status: AC
  Filled 2015-03-03: qty 5

## 2015-03-03 MED ORDER — EPHEDRINE SULFATE 50 MG/ML IJ SOLN
INTRAMUSCULAR | Status: AC
  Filled 2015-03-03: qty 1

## 2015-03-03 MED ORDER — FENTANYL CITRATE (PF) 100 MCG/2ML IJ SOLN
INTRAMUSCULAR | Status: DC | PRN
  Administered 2015-03-03: 25 ug via INTRAVENOUS
  Administered 2015-03-03: 50 ug via INTRAVENOUS

## 2015-03-03 MED ORDER — DIPHENHYDRAMINE HCL 50 MG/ML IJ SOLN
INTRAMUSCULAR | Status: AC
  Filled 2015-03-03: qty 1

## 2015-03-03 MED ORDER — DIPHENHYDRAMINE HCL 50 MG/ML IJ SOLN
INTRAMUSCULAR | Status: DC | PRN
  Administered 2015-03-03: 25 mg via INTRAVENOUS

## 2015-03-03 MED ORDER — LIDOCAINE-EPINEPHRINE (PF) 1 %-1:200000 IJ SOLN
INTRAMUSCULAR | Status: AC
  Filled 2015-03-03: qty 30

## 2015-03-03 MED ORDER — OXYCODONE HCL 5 MG PO TABS: 5.0000 mg | ORAL_TABLET | ORAL | Status: DC | PRN

## 2015-03-03 MED ORDER — PROPOFOL 10 MG/ML IV BOLUS
INTRAVENOUS | Status: AC
  Filled 2015-03-03: qty 20

## 2015-03-03 MED ORDER — MIDAZOLAM HCL 5 MG/5ML IJ SOLN
INTRAMUSCULAR | Status: DC | PRN
Start: 2015-03-03 — End: 2015-03-03
  Administered 2015-03-03: 2 mg via INTRAVENOUS

## 2015-03-03 MED ORDER — PROPOFOL 10 MG/ML IV BOLUS
INTRAVENOUS | Status: DC | PRN
  Administered 2015-03-03: 20 mg via INTRAVENOUS

## 2015-03-03 MED ORDER — MIDAZOLAM HCL 2 MG/2ML IJ SOLN
INTRAMUSCULAR | Status: AC
  Filled 2015-03-03: qty 2

## 2015-03-03 MED ORDER — PROTAMINE SULFATE 10 MG/ML IV SOLN
INTRAVENOUS | Status: AC
  Filled 2015-03-03: qty 5

## 2015-03-03 MED ORDER — LIDOCAINE HCL (CARDIAC) 20 MG/ML IV SOLN
INTRAVENOUS | Status: AC
  Filled 2015-03-03: qty 5

## 2015-03-03 MED ORDER — DEXAMETHASONE SODIUM PHOSPHATE 4 MG/ML IJ SOLN
INTRAMUSCULAR | Status: AC
  Filled 2015-03-03: qty 2

## 2015-03-03 MED ORDER — ESMOLOL HCL 100 MG/10ML IV SOLN
INTRAVENOUS | Status: AC
  Filled 2015-03-03: qty 10

## 2015-03-03 MED ORDER — PROPOFOL 500 MG/50ML IV EMUL
INTRAVENOUS | Status: DC | PRN
  Administered 2015-03-03: 75 ug/kg/min via INTRAVENOUS

## 2015-03-03 MED ORDER — ONDANSETRON HCL 4 MG/2ML IJ SOLN
INTRAMUSCULAR | Status: AC
  Filled 2015-03-03: qty 2

## 2015-03-03 MED ORDER — STERILE WATER FOR INJECTION IJ SOLN
INTRAMUSCULAR | Status: AC
  Filled 2015-03-03: qty 10

## 2015-03-03 MED ORDER — LIDOCAINE-EPINEPHRINE (PF) 1 %-1:200000 IJ SOLN
INTRAMUSCULAR | Status: DC | PRN
  Administered 2015-03-03: 30 mL

## 2015-03-03 MED ORDER — 0.9 % SODIUM CHLORIDE (POUR BTL) OPTIME
TOPICAL | Status: DC | PRN
  Administered 2015-03-03: 1000 mL

## 2015-03-03 MED ORDER — FENTANYL CITRATE (PF) 100 MCG/2ML IJ SOLN: 25.0000 ug | INTRAMUSCULAR | Status: DC | PRN

## 2015-03-03 MED ORDER — ONDANSETRON HCL 4 MG/2ML IJ SOLN: 4.0000 mg | Freq: Once | INTRAMUSCULAR | Status: DC | PRN

## 2015-03-03 SURGICAL SUPPLY — 27 items
ARMBAND PINK RESTRICT EXTREMIT (MISCELLANEOUS) ×2 IMPLANT
CANISTER SUCTION 2500CC (MISCELLANEOUS) ×2 IMPLANT
CLIP TI MEDIUM 6 (CLIP) ×2 IMPLANT
CLIP TI WIDE RED SMALL 6 (CLIP) ×2 IMPLANT
COVER PROBE W GEL 5X96 (DRAPES) IMPLANT
DRAIN PENROSE 1/4X12 LTX STRL (WOUND CARE) ×2 IMPLANT
ELECT REM PT RETURN 9FT ADLT (ELECTROSURGICAL) ×2
ELECTRODE REM PT RTRN 9FT ADLT (ELECTROSURGICAL) ×1 IMPLANT
GEL ULTRASOUND 20GR AQUASONIC (MISCELLANEOUS) IMPLANT
GLOVE BIO SURGEON STRL SZ 6.5 (GLOVE) ×4 IMPLANT
GLOVE BIOGEL PI IND STRL 6.5 (GLOVE) IMPLANT
GLOVE BIOGEL PI INDICATOR 6.5 (GLOVE) ×2
GLOVE SS BIOGEL STRL SZ 7 (GLOVE) ×1 IMPLANT
GLOVE SUPERSENSE BIOGEL SZ 7 (GLOVE) ×1
GOWN STRL REUS W/ TWL LRG LVL3 (GOWN DISPOSABLE) ×3 IMPLANT
GOWN STRL REUS W/TWL LRG LVL3 (GOWN DISPOSABLE) ×6
KIT BASIN OR (CUSTOM PROCEDURE TRAY) ×2 IMPLANT
KIT ROOM TURNOVER OR (KITS) ×2 IMPLANT
LIQUID BAND (GAUZE/BANDAGES/DRESSINGS) ×2 IMPLANT
NS IRRIG 1000ML POUR BTL (IV SOLUTION) ×2 IMPLANT
PACK CV ACCESS (CUSTOM PROCEDURE TRAY) ×2 IMPLANT
PAD ARMBOARD 7.5X6 YLW CONV (MISCELLANEOUS) ×4 IMPLANT
SUT PROLENE 6 0 BV (SUTURE) ×2 IMPLANT
SUT VIC AB 3-0 SH 27 (SUTURE) ×2
SUT VIC AB 3-0 SH 27X BRD (SUTURE) ×1 IMPLANT
UNDERPAD 30X30 INCONTINENT (UNDERPADS AND DIAPERS) ×2 IMPLANT
WATER STERILE IRR 1000ML POUR (IV SOLUTION) ×2 IMPLANT

## 2015-03-03 NOTE — Anesthesia Procedure Notes (Signed)
Procedure Name: MAC Date/Time: 03/03/2015 11:10 AM Performed by: Suzy Bouchard Patient Re-evaluated:Patient Re-evaluated prior to inductionOxygen Delivery Method: Nasal cannula Intubation Type: IV induction

## 2015-03-03 NOTE — Op Note (Signed)
OPERATIVE REPORT  Date of Surgery: 03/03/2015  Surgeon: [name], MD  Assistant: Leontine Locket  PA   Pre-op Diagnosis: End Stage Renal Disease N18.6  Post-op Diagnosis: end stage renal disease  Procedure: Procedure(s): Creation of Left Arm RADIOCEPHALIC ARTERIOVENOUS FISTULA   Anesthesia: Mac  EBL: Minimal  Complications: None  Procedure Details: The patient was taken off and placed in supine position at which time the left upper extremity was prepped Betadine scrub and solution draped in routine sterile manner. After infiltration of 1% Xylocaine with epinephrine a short longitudinal incision was made just proximal to the wrist midway between the radial artery and cephalic vein. The cephalic vein was dissected free branches ligated with 3 and 4-0 silk ties and divided. It was transected after ligating it distally and gently dilated with heparinized saline and marked for orientation purposes. It was an excellent vein being about 3-3-1/2 mm in size. Radial artery was then exposed beneath fascia. Care was taken not to injure the radial nerve. Artery was a 2-1/2-3 mm vessel with good pulse. It was encircled proximally and distally with Vesseloops opened 15 blade extended with Potts scissors. The vein was carefully measured spatulated and anastomosed end to side with 6-0 Prolene. Vessel loops then released there was excellent pulse and palpable thrill up to the antecubital area. No heparin or protamine was given. Adequate hemostasis was achieved and the wound was closed in layers of Vicryl subcuticular fashion with Dermabond patient taken to recovery room in satisfactory condition   [name], MD 03/03/2015 12:08 PM

## 2015-03-03 NOTE — Transfer of Care (Signed)
Immediate Anesthesia Transfer of Care Note  Patient: Jim Wyatt  Procedure(s) Performed: Procedure(s): Creation of Left Arm RADIOCEPHALIC ARTERIOVENOUS FISTULA  (Left)  Patient Location: PACU  Anesthesia Type:MAC  Level of Consciousness: awake, alert , oriented and patient cooperative  Airway & Oxygen Therapy: Patient Spontanous Breathing  Post-op Assessment: Report given to RN and Post -op Vital signs reviewed and stable  Post vital signs: Reviewed and stable  Last Vitals:  Filed Vitals:   03/03/15 0935 03/03/15 1217  BP: 155/101 149/90  Pulse:    Temp:  36.6 C  Resp:  15    Complications: No apparent anesthesia complications

## 2015-03-03 NOTE — Anesthesia Preprocedure Evaluation (Addendum)
Anesthesia Evaluation  Patient identified by MRN, date of birth, ID band Patient awake    Reviewed: Allergy & Precautions, NPO status , Patient's Chart, lab work & pertinent test results  Airway Mallampati: III  TM Distance: >3 FB Neck ROM: Full    Dental  (+) Teeth Intact, Dental Advisory Given   Pulmonary asthma , pneumonia, resolved,  Pt treated for CAP on 02/25/15 given fever and RUL infiltrate  Completely clear breath sounds  breath sounds clear to auscultation       Cardiovascular hypertension, negative cardio ROS   Rhythm:Regular Rate:Normal     Neuro/Psych  Headaches,    GI/Hepatic negative GI ROS, Neg liver ROS,   Endo/Other  negative endocrine ROS  Renal/GU CRF and ARFRenal diseaseK is 3.4     Musculoskeletal   Abdominal   Peds  Hematology  (+) anemia , HIV, Hgb is 10 today   Anesthesia Other Findings States he never had respiratory distress or wheezing or needed oxygen during CAP treatment 1 week ago   Reproductive/Obstetrics                           Anesthesia Physical  Anesthesia Plan  ASA: III  Anesthesia Plan: MAC   Post-op Pain Management:    Induction: Intravenous  Airway Management Planned: Simple Face Mask  Additional Equipment:   Intra-op Plan:   Post-operative Plan:   Informed Consent: I have reviewed the patients History and Physical, chart, labs and discussed the procedure including the risks, benefits and alternatives for the proposed anesthesia with the patient or authorized representative who has indicated his/her understanding and acceptance.   Dental advisory given  Plan Discussed with: CRNA and Anesthesiologist  Anesthesia Plan Comments:         Anesthesia Quick Evaluation

## 2015-03-03 NOTE — Discharge Instructions (Signed)
° ° °  03/03/2015 Jim Wyatt [id number] 1980/11/25  Surgeon(s): Mal Misty, MD  Procedure(s): Creation of Left Arm RADIOCEPHALIC ARTERIOVENOUS FISTULA   x Do not stick fistula for 12 weeks

## 2015-03-03 NOTE — Interval H&P Note (Signed)
History and Physical Interval Note:  03/03/2015 10:31 AM  Jim Wyatt  has presented today for surgery, with the diagnosis of End Stage Renal Disease N18.6  The various methods of treatment have been discussed with the patient and family. After consideration of risks, benefits and other options for treatment, the patient has consented to  Procedure(s): RADIOCEPHALIC ARTERIOVENOUS (AV) FISTULA CREATION (Left) as a surgical intervention .  The patient's history has been reviewed, patient examined, no change in status, stable for surgery.  I have reviewed the patient's chart and labs.  Questions were answered to the patient's satisfaction.     [name]

## 2015-03-03 NOTE — Anesthesia Postprocedure Evaluation (Signed)
Anesthesia Post Note  Patient: Jim Wyatt  Procedure(s) Performed: Procedure(s) (LRB): Creation of Left Arm RADIOCEPHALIC ARTERIOVENOUS FISTULA  (Left)  Patient location during evaluation: PACU Anesthesia Type: MAC Level of consciousness: awake and alert Pain management: pain level controlled Vital Signs Assessment: post-procedure vital signs reviewed and stable Respiratory status: spontaneous breathing, nonlabored ventilation and respiratory function stable Cardiovascular status: blood pressure returned to baseline and stable Postop Assessment: no signs of nausea or vomiting Anesthetic complications: no    Last Vitals:  Filed Vitals:   03/03/15 1315 03/03/15 1330  BP: 139/98 140/99  Pulse: 82 89  Temp:    Resp: 17 14    Last Pain:  Filed Vitals:   03/03/15 1335  PainSc: Asleep                 Zenaida Deed

## 2015-03-03 NOTE — H&P (View-Only) (Signed)
Subjective:     Patient ID: Jim Wyatt, male   DOB: Dec 08, 1980, 35 y.o.   MRN: [id number]  HPI this 35 year old male has end-stage renal disease and was referred by Dr. [name] for evaluation of vascular access. Patient is right-handed. He is HIV positive. His dialysis began last week through a catheter in the right IJ. He does not know the exact etiology of his kidney failure.   Past Medical History  Diagnosis Date  . Shingles < 2010  . HIV infection ([place]) dx'd 10/2013  . Cellulitis 11/2013    LLE  . CAP (community acquired pneumonia) 10/2013  . DVT (deep venous thrombosis) ([place]) 10/2013    BLE  . Multiple food allergies   . Multiple environmental allergies     "trees, dogs, cats"  . Chronic kidney disease   . 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 Hypertension     Social History  Substance Use Topics  . Smoking status: Never Smoker   . Smokeless tobacco: Never Used  . Alcohol Use: Yes    Family History  Problem Relation Age of Onset  . Diabetes Maternal Grandmother   . Kidney disease Maternal Grandmother     Allergies  Allergen Reactions  . Bactrim [Sulfamethoxazole-Trimethoprim] Other (See Comments)    Renal failure  . Cauliflower [Brassica Oleracea Italica] Anaphylaxis and Other (See Comments)    Pt states his throat closes up  . Vancomycin Other (See Comments)    Patient states vancomycin caused kidney injury  . Amlodipine Palpitations    fatigue  . Augmentin [Amoxicillin-Pot Clavulanate] Other (See Comments)    unknown  . Banana Other (See Comments)  . Fish Allergy Hives  . Onion Rash     Current outpatient prescriptions:  .  abacavir (ZIAGEN) 300 MG tablet, Take 2 tablets (600 mg total) by mouth daily., Disp: 60 tablet, Rfl: 11 .  albuterol (PROVENTIL HFA;VENTOLIN HFA) 108 (90 BASE) MCG/ACT inhaler, Inhale 2 puffs into the lungs every 6 (six) hours as needed for wheezing or shortness of breath., Disp: 1 Inhaler, Rfl: 2 .  carvedilol (COREG) 12.5  MG tablet, Take 12.5 mg by mouth 2 (two) times daily., Disp: , Rfl: 12 .  dapsone 100 MG tablet, Take 1 tablet (100 mg total) by mouth daily., Disp: 30 tablet, Rfl: 1 .  darunavir-cobicistat (PREZCOBIX) 800-150 MG per tablet, Take 1 tablet by mouth daily. Swallow whole. Do NOT crush, break or chew tablets. Take with food., Disp: 30 tablet, Rfl: 11 .  dolutegravir (TIVICAY) 50 MG tablet, Take 1 tablet (50 mg total) by mouth daily., Disp: 30 tablet, Rfl: 11 .  ondansetron (ZOFRAN) 4 MG tablet, 30 minutes before her meds as needed for nausea (Patient taking differently: Take 4 mg by mouth every 8 (eight) hours as needed for nausea or vomiting. 30 minutes before her meds as needed for nausea), Disp: 30 tablet, Rfl: 3 .  oxyCODONE (OXY IR/ROXICODONE) 5 MG immediate release tablet, Take 1 tablet (5 mg total) by mouth every 4 (four) hours as needed for moderate pain., Disp: 20 tablet, Rfl: 0  Filed Vitals:   02/16/15 1220 02/16/15 1222  BP: 174/101 157/98  Pulse: 95   Temp: 98.2 F (36.8 C)   TempSrc: Oral   Resp: 16   Height: 5' 7.5" (1.715 m)   Weight: 172 lb 9.6 oz (78.291 kg)     Body mass index is 26.62 kg/(m^2).           Review of Systems Denies chest pain,  dyspnea on exertion, PND, orthopnea. Does have a history of leg swelling and DVT. As noted above has HIV positive status. -other systems negative complete review of systems other than history of shingles     Objective:   Physical Exam BP 157/98 mmHg  Pulse 95  Temp(Src) 98.2 F (36.8 C) (Oral)  Resp 16  Ht 5' 7.5" (1.715 m)  Wt 172 lb 9.6 oz (78.291 kg)  BMI 26.62 kg/m2  Gen.-alert and oriented x3 in no apparent distress HEENT normal for age Lungs no rhonchi or wheezing Cardiovascular regular rhythm no murmurs carotid pulses 3+ palpable no bruits audible Abdomen soft nontender no palpable masses Musculoskeletal free of  major deformities Skin clear -no rashes Neurologic normal Lower extremities 3+ femoral and  dorsalis pedis pulses palpable bilaterally with no edema  both upper extremities examined. Cephalic vein in the left forearm appears to be  And excellent vein communicating with the basilic system in the antecubital area.   Today I ordered bilateral vein mapping and arterial studies. There are serial studies in the upper extremities are normal. The vein mapping confirms a widely patent cephalic vein in the left forearm as well as upper arm.      Assessment:      #1 end-stage renal disease-on dialysis Tuesday Thursday Saturday -started last week #2 HIV positive #3 history of shingles     Plan:      plan left radial cephalic AV fistula on Wednesday January 4 - risks and benefits discussed with patient and he would like to proceed

## 2015-03-04 ENCOUNTER — Encounter (HOSPITAL_COMMUNITY): Payer: Self-pay | Admitting: Vascular Surgery

## 2015-03-05 ENCOUNTER — Telehealth: Payer: Self-pay | Admitting: Vascular Surgery

## 2015-03-05 NOTE — Telephone Encounter (Signed)
LM for pt re appt, dpm °

## 2015-03-05 NOTE — Telephone Encounter (Signed)
-----   Message from Mena Goes, RN sent at 03/03/2015 12:43 PM EST ----- Regarding: schedule   ----- Message -----    From: Gabriel Earing, PA-C    Sent: 03/03/2015  12:05 PM      To: [facility]vs Charge Pool  S/p left RC AVF. F/u with Dr. Kellie Simmering in 6 weeks with duplex.  Thanks, Aldona Bar

## 2015-03-18 ENCOUNTER — Inpatient Hospital Stay: Payer: PRIVATE HEALTH INSURANCE | Admitting: Internal Medicine

## 2015-03-22 ENCOUNTER — Telehealth: Payer: Self-pay | Admitting: *Deleted

## 2015-03-22 NOTE — Telephone Encounter (Signed)
RN received referral for [place] to assist the patient with medication adherence. RN reviewed the chart prior to contacting the patient and noted he had a fistula placed for Dialysis. The chart states he is suppose to receive dialysis treatment on T,Th, Sat. RN contacted the patient and left a message stating my name and reason for my call. RN asked the patient to return my call. RN would also like to ensure that the patient is seeking financial guidance from the Case Workers at the dialysis center. The patient would most likely qualify for disability based on his Dialysis/ESRD.

## 2015-03-22 NOTE — Telephone Encounter (Signed)
Thanks Ambre!

## 2015-04-16 ENCOUNTER — Encounter: Payer: Self-pay | Admitting: Vascular Surgery

## 2015-04-20 ENCOUNTER — Ambulatory Visit: Payer: Self-pay | Admitting: Internal Medicine

## 2015-04-22 ENCOUNTER — Encounter: Payer: Self-pay | Admitting: Internal Medicine

## 2015-04-22 ENCOUNTER — Ambulatory Visit (INDEPENDENT_AMBULATORY_CARE_PROVIDER_SITE_OTHER): Payer: BLUE CROSS/BLUE SHIELD | Admitting: Internal Medicine

## 2015-04-22 VITALS — BP 145/89 | HR 98 | Temp 99.3°F | Ht 67.0 in | Wt 158.0 lb

## 2015-04-22 DIAGNOSIS — B20 Human immunodeficiency virus [HIV] disease: Secondary | ICD-10-CM

## 2015-04-22 MED ORDER — DOLUTEGRAVIR SODIUM 50 MG PO TABS: 50.0000 mg | ORAL_TABLET | Freq: Every day | ORAL | Status: DC

## 2015-04-22 MED ORDER — DARUNAVIR-COBICISTAT 800-150 MG PO TABS: 1.0000 | ORAL_TABLET | Freq: Every day | ORAL | Status: DC

## 2015-04-22 MED ORDER — ONDANSETRON 8 MG PO TBDP: 8.0000 mg | ORAL_TABLET | Freq: Three times a day (TID) | ORAL | Status: DC | PRN

## 2015-04-22 MED ORDER — RILPIVIRINE HCL 25 MG PO TABS: 25.0000 mg | ORAL_TABLET | Freq: Every day | ORAL | Status: DC

## 2015-04-22 MED ORDER — DAPSONE 100 MG PO TABS: 100.0000 mg | ORAL_TABLET | Freq: Every day | ORAL | Status: DC

## 2015-04-22 NOTE — Progress Notes (Signed)
Patient ID: Jim Wyatt, male   DOB: August 06, 1980, 35 y.o.   MRN: [id number]       Patient ID: Jim Wyatt, male   DOB: 10-Jan-1981, 35 y.o.   MRN: [id number]  HPI Jim Wyatt is a 35yo M with HIV disease and CKD 5 with ESRD on HD, poorly controlled with CD 4 cout50/VL 114,000 Nov 2016. Hx of poor insight and non-compliance Stopped taking his hiv regimen due to nausea. He has recently started hemodialysis which makes him fatigue on days of dialysis but tolerating his sessions.   Outpatient Encounter Prescriptions as of 04/22/2015  Medication Sig  . abacavir (ZIAGEN) 300 MG tablet Take 2 tablets (600 mg total) by mouth daily. (Patient not taking: Reported on 04/22/2015)  . albuterol (PROVENTIL HFA;VENTOLIN HFA) 108 (90 BASE) MCG/ACT inhaler Inhale 2 puffs into the lungs every 6 (six) hours as needed for wheezing or shortness of breath. (Patient not taking: Reported on 04/22/2015)  . carvedilol (COREG) 12.5 MG tablet Take 12.5 mg by mouth 2 (two) times daily. Reported on 04/22/2015  . dapsone 100 MG tablet Take 1 tablet (100 mg total) by mouth daily. (Patient not taking: Reported on 04/22/2015)  . darunavir-cobicistat (PREZCOBIX) 800-150 MG per tablet Take 1 tablet by mouth daily. Swallow whole. Do NOT crush, break or chew tablets. Take with food. (Patient not taking: Reported on 04/22/2015)  . dolutegravir (TIVICAY) 50 MG tablet Take 1 tablet (50 mg total) by mouth daily. (Patient not taking: Reported on 04/22/2015)  . levofloxacin (LEVAQUIN) 500 MG tablet Take 1 tablet (500 mg total) by mouth every other day. (Patient not taking: Reported on 04/22/2015)  . oxyCODONE (OXY IR/ROXICODONE) 5 MG immediate release tablet Take 1 tablet (5 mg total) by mouth every 4 (four) hours as needed for moderate pain. (Patient not taking: Reported on 04/22/2015)  . promethazine (PHENERGAN) 12.5 MG tablet Take 1 tablet (12.5 mg total) by mouth every 6 (six) hours as needed for nausea or vomiting (and 30 minutes prior to  taking antibiotics). (Patient not taking: Reported on 04/22/2015)   No facility-administered encounter medications on file as of 04/22/2015.     Patient Active Problem List   Diagnosis Date Noted  . CAP (community acquired pneumonia) 02/25/2015  . End stage renal disease ([place]) 02/16/2015  . Swelling of right knee joint 01/12/2015  . History of DVT (deep vein thrombosis) 01/12/2015  . Anemia 01/12/2015  . Acute blood loss anemia   . Candidal esophagitis ([place])   . Constipation   . Nausea without vomiting 12/20/2014  . Abscess of right thigh 12/19/2014  . Chronic kidney disease (CKD), stage IV (severe) ([place]) 12/19/2014  . 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 Anemia due to end stage renal disease ([place]) 12/19/2014  . Hyponatremia 12/19/2014  . AIDS ([place])   . Community acquired pneumonia 07/30/2014  . Sepsis ([place]) 07/30/2014  . Metabolic acidosis 123456  . Anemia of chronic disease 07/30/2014  . Thrombocytopenia ([place]) 07/30/2014  . Acute on chronic renal failure ([place])   . 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 Human immunodeficiency virus (HIV) disease ([place]) 12/11/2013  . Hospital discharge follow-up 12/11/2013  . CMV (cytomegalovirus infection) ([place]) 12/07/2013  . Protein-calorie malnutrition, severe ([place]) 12/06/2013  . 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 DVT (deep venous thrombosis) ([place]) 11/24/2013  . Asthma, chronic 11/24/2013  . Acquired immune deficiency syndrome ([place]) 11/14/2013  . AKI (acute kidney injury) ([place]) 11/11/2013  . Elevated liver enzymes 11/09/2013  . History  of shingles 11/09/2013  . Obesity 11/09/2013  . Pneumonia   . Asthma      Health Maintenance Due  Topic Date Due  . TETANUS/TDAP  01/21/2002     Review of Systems Nausea with medications. No fever, chills, nightsweats. Physical Exam   BP 145/89 mmHg  Pulse 98  Temp(Src) 99.3 F (37.4 C) (Oral)  Ht 5\' 7"  (1.702 m)  Wt 158 lb  (71.668 kg)  BMI 24.74 kg/m2 Physical Exam  Constitutional: He is oriented to person, place, and time. He appears chronically ill. No distress.  HENT:  Mouth/Throat: Oropharynx is clear and moist. No oropharyngeal exudate.  Cardiovascular: Normal rate, regular rhythm and normal heart sounds. Exam reveals no gallop and no friction rub.  No murmur heard.  Pulmonary/Chest: Effort normal and breath sounds normal. No respiratory distress. He has no wheezes.  Abdominal: Soft. Bowel sounds are normal. He exhibits no distension. There is no tenderness.  Lymphadenopathy:  He has no cervical adenopathy.  Neurological: He is alert and oriented to person, place, and time.  Skin: Skin is warm and dry. No rash noted. No erythema.  Psychiatric: He has a normal mood and affect. His behavior is normal.    Lab Results  Component Value Date   CD4TCELL 4* 01/11/2015   Lab Results  Component Value Date   CD4TABS 50* 01/11/2015   CD4TABS 20* 12/19/2014   CD4TABS 60* 07/30/2014   Lab Results  Component Value Date   HIV1RNAQUANT [id number]* 01/11/2015   No results found for: HEPBSAB No results found for: RPR  CBC Lab Results  Component Value Date   WBC 6.3 02/27/2015   RBC 2.69* 02/27/2015   HGB 10.5* 03/03/2015   HCT 31.0* 03/03/2015   PLT 159 02/27/2015   MCV 87.7 02/27/2015   MCH 28.3 02/27/2015   MCHC 32.2 02/27/2015   RDW 14.7 02/27/2015   LYMPHSABS 1.6 02/25/2015   MONOABS 0.6 02/25/2015   EOSABS 0.1 02/25/2015   BASOSABS 0.0 02/25/2015   BMET Lab Results  Component Value Date   NA 139 03/03/2015   K 3.2* 03/03/2015   CL 105 02/27/2015   CO2 23 02/27/2015   GLUCOSE 96 03/03/2015   BUN 24* 02/27/2015   CREATININE 7.86* 02/27/2015   CALCIUM 8.7* 02/27/2015   GFRNONAA 8* 02/27/2015   GFRAA 9* 02/27/2015     Assessment and Plan HIV disease - poorly controlled = discuss this regimen - Dlg/prexcobix/ripilivirine, in hopes he is willing to take these medicaitons  Nausea =  Will give anti emetic  AIDS =Refill dapsone for oi proph  Will get baseline labs today

## 2015-04-23 LAB — T-HELPER CELL (CD4) - (RCID CLINIC ONLY)
CD4 % Helper T Cell: 4 % — ABNORMAL LOW (ref 33–55)
CD4 T Cell Abs: 20 /uL — ABNORMAL LOW (ref 400–2700)

## 2015-04-26 LAB — HIV-1 RNA QUANT-NO REFLEX-BLD
HIV 1 RNA Quant: 83181 copies/mL — ABNORMAL HIGH (ref <20)
HIV-1 RNA Quant, Log: 4.92 Log copies/mL — ABNORMAL HIGH (ref <1.30)

## 2015-04-27 ENCOUNTER — Ambulatory Visit (INDEPENDENT_AMBULATORY_CARE_PROVIDER_SITE_OTHER): Payer: Self-pay | Admitting: Vascular Surgery

## 2015-04-27 ENCOUNTER — Ambulatory Visit (HOSPITAL_COMMUNITY)
Admission: RE | Admit: 2015-04-27 | Discharge: 2015-04-27 | Disposition: A | Discharge status: Home / Self Care | Payer: BLUE CROSS/BLUE SHIELD | Source: Ambulatory Visit | Attending: Vascular Surgery | Admitting: Vascular Surgery

## 2015-04-27 ENCOUNTER — Encounter: Payer: Self-pay | Admitting: Vascular Surgery

## 2015-04-27 VITALS — BP 138/80 | HR 96 | Temp 99.3°F | Resp 16 | Ht 67.0 in | Wt 155.0 lb

## 2015-04-27 DIAGNOSIS — B20 Human immunodeficiency virus [HIV] disease: Secondary | ICD-10-CM | POA: Insufficient documentation

## 2015-04-27 DIAGNOSIS — N186 End stage renal disease: Secondary | ICD-10-CM | POA: Diagnosis present

## 2015-04-27 DIAGNOSIS — I129 Hypertensive chronic kidney disease with stage 1 through stage 4 chronic kidney disease, or unspecified chronic kidney disease: Secondary | ICD-10-CM | POA: Diagnosis not present

## 2015-04-27 DIAGNOSIS — N189 Chronic kidney disease, unspecified: Secondary | ICD-10-CM | POA: Insufficient documentation

## 2015-04-27 DIAGNOSIS — Z4931 Encounter for adequacy testing for hemodialysis: Secondary | ICD-10-CM

## 2015-04-27 DIAGNOSIS — Z992 Dependence on renal dialysis: Secondary | ICD-10-CM

## 2015-04-27 NOTE — Progress Notes (Signed)
Subjective:     Patient ID: Jim Wyatt, male   DOB: 1980/08/27, 35 y.o.   MRN: [id number]  HPI  This 35 year old male end-stage renal disease on hemodialysis Tuesday Thursday Saturday. He returns today for follow-up regarding a left radial-cephalic AV fistula which I created on 03/03/2015. He denies pain or numbness in the left hand. He currently is being dialyzed through a catheter in the right IJ.   Review of Systems     Objective:   Physical Exam BP 138/80 mmHg  Pulse 96  Temp(Src) 99.3 F (37.4 C) (Oral)  Resp 16  Ht 5\' 7"  (1.702 m)  Wt 155 lb (70.308 kg)  BMI 24.27 kg/m2  SpO2 98%   general well-developed well nourished male no apparent distress alert and oriented 3  left upper extremity with excellent radial-cephalic AV fistula with vein easily visualized through the skin with excellent pulse and palpable thrill. Left hand well perfused. A few branches are noted but are not affecting the flow through the fistula    today I ordered a duplex scan of the left forearm fistula which reveals it to be of good caliber and with excellent flow. As noted 2 branches are present which could be ligated and later time if the fistula does not develop properly     Assessment:      end-stage renal disease with a nicely functioning left radial-cephalic AV fistula 2 branches are visible but fistula has developed nicely with excellent flow and do not think these branches will effect performance     Plan:      may utilize left radial-cephalic AV fistula as of 06/01/2015  If for some reason flow rates diminish could consider ligation of side branches at a later date but doubt that will be necessary

## 2015-05-24 ENCOUNTER — Ambulatory Visit: Payer: BLUE CROSS/BLUE SHIELD | Admitting: Internal Medicine

## 2015-06-16 ENCOUNTER — Telehealth: Payer: Self-pay | Admitting: *Deleted

## 2015-06-16 NOTE — Telephone Encounter (Signed)
He has been historically reluctant to take his hiv medication. We can either try to reach out him through hemodialysis center, though i don't know which one that is.can also try to have ambre reach out. I feel like we have tried on numerous fronts to get him to be adherent to his hiv meds. He knows the consequences if he doesn't take meds.

## 2015-06-16 NOTE — Telephone Encounter (Signed)
RN received a message from [facility], stating concerns about the patient's level of commitment adhering to his medications. RN reviewed Mr. Hanko chart prior to calling the patient. A message was left stating the my name and that I would love to offer support to him. RN asked the patient to please return my call and if he did not want to speak directly I would be happy to answer his email or text message. RN stressed to the patient that I would really appreciate the opportunity to be a support for him. RN provided my work cell phone and email address for the patient to return my call

## 2015-06-16 NOTE — Telephone Encounter (Signed)
[facility] in [place] reporting unable to reach pt since 04/23/15 to deliver rxes.  RN sent Kinnie Scales, Community[facility] RN, a message requesting that she reach out to the pt.  The patient also "No Showed" for his last appt with Dr. Baxter Flattery on 05/24/15.  Patient currently on hemodialysis on T/Th/Sat.  Last CD4 value was 20, VL = [id number].

## 2015-06-17 ENCOUNTER — Telehealth: Payer: Self-pay | Admitting: *Deleted

## 2015-06-17 NOTE — Telephone Encounter (Signed)
-----   Message from Ileana Roup, RN sent at 06/16/2015 10:47 AM EDT ----- Regarding: Previous referral It looks like you tried to contact this pt a while ago.  Per [facility] in [place] they have been unable to contact the pt to deliver his HIV rxes since 04/23/15.  Would you be able to reach out to him again?  He also needs to make follow-up appts for lab and MD.  Thank you for the great work you are doing.  Langley Gauss

## 2015-07-06 ENCOUNTER — Encounter (HOSPITAL_COMMUNITY): Payer: Self-pay | Admitting: Emergency Medicine

## 2015-07-06 ENCOUNTER — Emergency Department (HOSPITAL_COMMUNITY): Payer: BLUE CROSS/BLUE SHIELD

## 2015-07-06 ENCOUNTER — Encounter (HOSPITAL_COMMUNITY): Payer: BLUE CROSS/BLUE SHIELD

## 2015-07-06 ENCOUNTER — Emergency Department (HOSPITAL_COMMUNITY)
Admit: 2015-07-06 | Discharge: 2015-07-06 | Disposition: A | Discharge status: Home / Self Care | Payer: BLUE CROSS/BLUE SHIELD | Attending: Emergency Medicine | Admitting: Emergency Medicine

## 2015-07-06 ENCOUNTER — Inpatient Hospital Stay (HOSPITAL_COMMUNITY)
Admission: EM | Admit: 2015-07-06 | Discharge: 2015-07-10 | DRG: 102 | Disposition: A | Discharge status: Home / Self Care | Payer: BLUE CROSS/BLUE SHIELD | Attending: Internal Medicine | Admitting: Internal Medicine

## 2015-07-06 DIAGNOSIS — N186 End stage renal disease: Secondary | ICD-10-CM | POA: Diagnosis present

## 2015-07-06 DIAGNOSIS — D696 Thrombocytopenia, unspecified: Secondary | ICD-10-CM | POA: Diagnosis present

## 2015-07-06 DIAGNOSIS — R112 Nausea with vomiting, unspecified: Secondary | ICD-10-CM | POA: Diagnosis not present

## 2015-07-06 DIAGNOSIS — G039 Meningitis, unspecified: Secondary | ICD-10-CM | POA: Insufficient documentation

## 2015-07-06 DIAGNOSIS — Z22322 Carrier or suspected carrier of Methicillin resistant Staphylococcus aureus: Secondary | ICD-10-CM

## 2015-07-06 DIAGNOSIS — E8889 Other specified metabolic disorders: Secondary | ICD-10-CM | POA: Diagnosis present

## 2015-07-06 DIAGNOSIS — B20 Human immunodeficiency virus [HIV] disease: Secondary | ICD-10-CM | POA: Diagnosis present

## 2015-07-06 DIAGNOSIS — R9431 Abnormal electrocardiogram [ECG] [EKG]: Secondary | ICD-10-CM | POA: Diagnosis not present

## 2015-07-06 DIAGNOSIS — Z888 Allergy status to other drugs, medicaments and biological substances status: Secondary | ICD-10-CM | POA: Diagnosis not present

## 2015-07-06 DIAGNOSIS — R51 Headache: Principal | ICD-10-CM | POA: Diagnosis present

## 2015-07-06 DIAGNOSIS — Z883 Allergy status to other anti-infective agents status: Secondary | ICD-10-CM

## 2015-07-06 DIAGNOSIS — N2581 Secondary hyperparathyroidism of renal origin: Secondary | ICD-10-CM | POA: Diagnosis present

## 2015-07-06 DIAGNOSIS — Z86718 Personal history of other venous thrombosis and embolism: Secondary | ICD-10-CM | POA: Diagnosis not present

## 2015-07-06 DIAGNOSIS — N032 Chronic nephritic syndrome with diffuse membranous glomerulonephritis: Secondary | ICD-10-CM | POA: Diagnosis present

## 2015-07-06 DIAGNOSIS — Z79899 Other long term (current) drug therapy: Secondary | ICD-10-CM | POA: Diagnosis not present

## 2015-07-06 DIAGNOSIS — R519 Headache, unspecified: Secondary | ICD-10-CM

## 2015-07-06 DIAGNOSIS — Z21 Asymptomatic human immunodeficiency virus [HIV] infection status: Secondary | ICD-10-CM | POA: Diagnosis not present

## 2015-07-06 DIAGNOSIS — Z9119 Patient's noncompliance with other medical treatment and regimen: Secondary | ICD-10-CM | POA: Diagnosis not present

## 2015-07-06 DIAGNOSIS — R609 Edema, unspecified: Secondary | ICD-10-CM

## 2015-07-06 DIAGNOSIS — D631 Anemia in chronic kidney disease: Secondary | ICD-10-CM | POA: Diagnosis present

## 2015-07-06 DIAGNOSIS — D899 Disorder involving the immune mechanism, unspecified: Secondary | ICD-10-CM

## 2015-07-06 DIAGNOSIS — I160 Hypertensive urgency: Secondary | ICD-10-CM | POA: Diagnosis present

## 2015-07-06 DIAGNOSIS — D849 Immunodeficiency, unspecified: Secondary | ICD-10-CM | POA: Diagnosis present

## 2015-07-06 DIAGNOSIS — Z91018 Allergy to other foods: Secondary | ICD-10-CM | POA: Diagnosis not present

## 2015-07-06 DIAGNOSIS — Z91013 Allergy to seafood: Secondary | ICD-10-CM | POA: Diagnosis not present

## 2015-07-06 DIAGNOSIS — I120 Hypertensive chronic kidney disease with stage 5 chronic kidney disease or end stage renal disease: Secondary | ICD-10-CM | POA: Diagnosis present

## 2015-07-06 DIAGNOSIS — G94 Other disorders of brain in diseases classified elsewhere: Secondary | ICD-10-CM | POA: Diagnosis present

## 2015-07-06 DIAGNOSIS — Z881 Allergy status to other antibiotic agents status: Secondary | ICD-10-CM | POA: Diagnosis not present

## 2015-07-06 DIAGNOSIS — Z992 Dependence on renal dialysis: Secondary | ICD-10-CM | POA: Diagnosis not present

## 2015-07-06 DIAGNOSIS — R0600 Dyspnea, unspecified: Secondary | ICD-10-CM

## 2015-07-06 DIAGNOSIS — H538 Other visual disturbances: Secondary | ICD-10-CM | POA: Diagnosis not present

## 2015-07-06 LAB — COMPREHENSIVE METABOLIC PANEL
ALT: 10 U/L — ABNORMAL LOW (ref 17–63)
AST: 11 U/L — ABNORMAL LOW (ref 15–41)
Albumin: 3.1 g/dL — ABNORMAL LOW (ref 3.5–5.0)
Alkaline Phosphatase: 41 U/L (ref 38–126)
Anion gap: 11 (ref 5–15)
BUN: 42 mg/dL — ABNORMAL HIGH (ref 6–20)
CO2: 27 mmol/L (ref 22–32)
Calcium: 8.4 mg/dL — ABNORMAL LOW (ref 8.9–10.3)
Chloride: 99 mmol/L — ABNORMAL LOW (ref 101–111)
Creatinine, Ser: 8.99 mg/dL — ABNORMAL HIGH (ref 0.61–1.24)
GFR calc Af Amer: 8 mL/min — ABNORMAL LOW (ref >60)
GFR calc non Af Amer: 7 mL/min — ABNORMAL LOW (ref >60)
Glucose, Bld: 95 mg/dL (ref 65–99)
Potassium: 3.9 mmol/L (ref 3.5–5.1)
Sodium: 137 mmol/L (ref 135–145)
Total Bilirubin: 1.5 mg/dL — ABNORMAL HIGH (ref 0.3–1.2)
Total Protein: 8.5 g/dL — ABNORMAL HIGH (ref 6.5–8.1)

## 2015-07-06 LAB — CBC
HCT: 31.6 % — ABNORMAL LOW (ref 39.0–52.0)
Hemoglobin: 10.5 g/dL — ABNORMAL LOW (ref 13.0–17.0)
MCH: 27.8 pg (ref 26.0–34.0)
MCHC: 33.2 g/dL (ref 30.0–36.0)
MCV: 83.6 fL (ref 78.0–100.0)
Platelets: 48 10*3/uL — ABNORMAL LOW (ref 150–400)
RBC: 3.78 MIL/uL — ABNORMAL LOW (ref 4.22–5.81)
RDW: 16.8 % — ABNORMAL HIGH (ref 11.5–15.5)
WBC: 4.4 10*3/uL (ref 4.0–10.5)

## 2015-07-06 LAB — CRYPTOCOCCAL ANTIGEN: Crypto Ag: NEGATIVE

## 2015-07-06 LAB — TROPONIN I
Troponin I: 0.04 ng/mL — ABNORMAL HIGH (ref <0.031)
Troponin I: 0.04 ng/mL — ABNORMAL HIGH (ref <0.031)

## 2015-07-06 LAB — LIPASE, BLOOD: Lipase: 33 U/L (ref 11–51)

## 2015-07-06 MED ORDER — SODIUM CHLORIDE 0.9% FLUSH: 3.0000 mL | INTRAVENOUS | Status: DC | PRN

## 2015-07-06 MED ORDER — CLONIDINE HCL 0.1 MG PO TABS
0.1000 mg | ORAL_TABLET | Freq: Once | ORAL | Status: AC
  Administered 2015-07-06: 0.1 mg via ORAL
  Filled 2015-07-06: qty 1

## 2015-07-06 MED ORDER — DIPHENHYDRAMINE HCL 50 MG/ML IJ SOLN
25.0000 mg | Freq: Once | INTRAMUSCULAR | Status: AC
  Administered 2015-07-06: 25 mg via INTRAVENOUS
  Filled 2015-07-06: qty 1

## 2015-07-06 MED ORDER — CARVEDILOL 12.5 MG PO TABS
12.5000 mg | ORAL_TABLET | Freq: Two times a day (BID) | ORAL | Status: DC
  Administered 2015-07-06 – 2015-07-10 (×7): 12.5 mg via ORAL
  Filled 2015-07-06 (×7): qty 1

## 2015-07-06 MED ORDER — SODIUM CHLORIDE 0.9% FLUSH
3.0000 mL | Freq: Two times a day (BID) | INTRAVENOUS | Status: DC
  Administered 2015-07-06 – 2015-07-09 (×5): 3 mL via INTRAVENOUS

## 2015-07-06 MED ORDER — METOCLOPRAMIDE HCL 5 MG/ML IJ SOLN
10.0000 mg | Freq: Once | INTRAMUSCULAR | Status: AC
  Administered 2015-07-06: 10 mg via INTRAVENOUS
  Filled 2015-07-06: qty 2

## 2015-07-06 MED ORDER — HYDROMORPHONE HCL 1 MG/ML IJ SOLN
0.5000 mg | Freq: Four times a day (QID) | INTRAMUSCULAR | Status: DC | PRN
  Administered 2015-07-06 – 2015-07-09 (×8): 0.5 mg via INTRAVENOUS
  Filled 2015-07-06 (×8): qty 1

## 2015-07-06 MED ORDER — PROMETHAZINE HCL 25 MG PO TABS
12.5000 mg | ORAL_TABLET | Freq: Four times a day (QID) | ORAL | Status: DC | PRN
  Administered 2015-07-08: 12.5 mg via ORAL
  Filled 2015-07-06 (×2): qty 1

## 2015-07-06 MED ORDER — PROCHLORPERAZINE EDISYLATE 5 MG/ML IJ SOLN
5.0000 mg | Freq: Four times a day (QID) | INTRAMUSCULAR | Status: DC | PRN
  Administered 2015-07-07 – 2015-07-08 (×2): 5 mg via INTRAVENOUS
  Filled 2015-07-06 (×2): qty 2

## 2015-07-06 MED ORDER — PROMETHAZINE HCL 12.5 MG RE SUPP
12.5000 mg | Freq: Four times a day (QID) | RECTAL | Status: DC | PRN
  Filled 2015-07-06: qty 1

## 2015-07-06 MED ORDER — LABETALOL HCL 5 MG/ML IV SOLN
20.0000 mg | Freq: Once | INTRAVENOUS | Status: AC
  Administered 2015-07-06: 20 mg via INTRAVENOUS
  Filled 2015-07-06: qty 4

## 2015-07-06 MED ORDER — LABETALOL HCL 5 MG/ML IV SOLN
10.0000 mg | Freq: Once | INTRAVENOUS | Status: AC
  Administered 2015-07-06: 10 mg via INTRAVENOUS
  Filled 2015-07-06: qty 4

## 2015-07-06 NOTE — ED Notes (Signed)
Pt is refusing MRI.

## 2015-07-06 NOTE — ED Notes (Signed)
Notified Dr. Redmond Pulling of patients refusal of MRI.

## 2015-07-06 NOTE — Progress Notes (Signed)
Pt arrived on unit 2140 hrs, A&O x4 no obvious distress, C/O 8/10 headache, bruising noted to left arm area of fistula, admission orders implemented, pt oriented to room and equipment

## 2015-07-06 NOTE — ED Notes (Signed)
Admitting MD at bedside.

## 2015-07-06 NOTE — ED Notes (Signed)
Portable X-ray at bedside- Tommi Rumps.

## 2015-07-06 NOTE — Progress Notes (Signed)
Date: 07/06/2015               Patient Name:  Jim Wyatt MRN: [id number]  DOB: Sep 23, 1980 Age / Sex: 35 y.o., male   PCP: [name], MD         Medical Service: Internal Medicine Teaching Service         Attending Physician: Dr. Sid Falcon, MD    First Contact: Dr. Jule Ser Pager: [phone]  Second Contact: Dr. Dellia Nims Pager: [phone]       After Hours (After 5p/  First Contact Pager: [phone]  weekends / holidays): Second Contact Pager: [phone]   Chief Complaint: headache, nausea, vomiting  History of Present Illness: Jim Wyatt is a 35 y.o. 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  States his headaches began last Tuesday and have progressively become worse.  Described as throbbing, constant, located "down the middle of my brain".  Rates pain currently and at worst a 7/10.  Has taken Tylenol at home with no relief.  He cannot think of anything that makes pain better.  It is worsened by change of position.  He does have a remote history of headaches but nothing of this intensity and unremitting duration.  Associated symptoms also include blurry vision that began about 3 days ago which is entirely new to him.  He denies any confusion, changes in mental status, stiff neck, fever, chills, myalgias, cough, rashes, diarrhea or constipation.  He reports weight loss this week, fatigue, malaise, and mild abdominal pain.  No recent sick contacts.  He does report some gait instability that is not new for him.  No recent falls.  During the course of his illness, patient has developed nausea and vomiting that began same day as his headaches.  He does have history of nausea and vomiting as noted during previous hospitalizations and reports nausea with many of his medications especially his HIV meds.  He is reporting  about 2 episodes of non-blood, non-bilious emesis per day with constant nausea.  Vomiting is associated with oral intake but he reports no loss of appetite.  This has resulted in him being unable to take any of his medications for the past week, including BP meds.     Patient went to dialysis today for the first time in 7 days due to his acute illness.  While at dialysis, he developed pain and swelling at his left fistula site so they switched access to his right chest dialysis access.  This was stopped shortly afterwards due to headache and blurred vision.  He ultimately received about 1/2 his dialysis treatment today.  In the ED, patient was hypertensive and given labetalol and clonidine.  He was also given anti-emetics.  His symptoms did not really improve with this.  CT was negative.  EKG showed possible new T-wave inversions that were new.  Troponin was 0.04.  CBC with WBC 4.4, Hgb 10.5, HCT 31, platelets 48.  BMET showed K+ 3.9, creatine 8.9.  Meds: Current Facility-Administered Medications  Medication Dose Route Frequency Provider Last Rate Last Dose  . carvedilol (COREG) tablet 12.5 mg  12.5 mg Oral BID WC Charlesetta Shanks, MD   12.5 mg at 07/06/15 1520  . promethazine (PHENERGAN) tablet 12.5 mg  12.5 mg Oral Q6H PRN Francesca Oman, DO  Or  . promethazine (PHENERGAN) suppository 12.5 mg  12.5 mg Rectal Q6H PRN Francesca Oman, DO       Or  . prochlorperazine (COMPAZINE) injection 5 mg  5 mg Intravenous Q6H PRN Francesca Oman, DO       Current Outpatient Prescriptions  Medication Sig Dispense Refill  . acetaminophen (TYLENOL) 500 MG tablet Take 1,000 mg by mouth every 6 (six) hours as needed (pain).    [name] albuterol (PROVENTIL HFA;VENTOLIN HFA) 108 (90 BASE) MCG/ACT inhaler Inhale 2 puffs into the lungs every 6 (six) hours as needed for wheezing or shortness of breath. 1 Inhaler 2  . carvedilol (COREG) 12.5 MG tablet Take 12.5 mg by mouth 2 (two) times daily. Reported on 04/22/2015  12  .  ondansetron (ZOFRAN ODT) 8 MG disintegrating tablet Take 1 tablet (8 mg total) by mouth every 8 (eight) hours as needed for nausea or vomiting. 30 tablet 3  . dapsone 100 MG tablet Take 1 tablet (100 mg total) by mouth daily. (Patient not taking: Reported on 07/06/2015) 30 tablet 1  . darunavir-cobicistat (PREZCOBIX) 800-150 MG tablet Take 1 tablet by mouth daily. Swallow whole. Do NOT crush, break or chew tablets. Take with food. (Patient not taking: Reported on 07/06/2015) 30 tablet 11  . dolutegravir (TIVICAY) 50 MG tablet Take 1 tablet (50 mg total) by mouth daily. (Patient not taking: Reported on 07/06/2015) 30 tablet 11  . rilpivirine (EDURANT) 25 MG TABS tablet Take 1 tablet (25 mg total) by mouth daily with breakfast. (Patient not taking: Reported on 07/06/2015) 30 tablet 11    Allergies: Allergies as of 07/06/2015 - Review Complete 07/06/2015  Allergen Reaction Noted  . Bactrim [sulfamethoxazole-trimethoprim] Other (See Comments) 11/14/2013  . Cauliflower [brassica oleracea italica] Anaphylaxis and Other (See Comments) 11/10/2013  . Vancomycin Other (See Comments) 12/04/2013  . Amlodipine Palpitations 01/12/2015  . Augmentin [amoxicillin-pot clavulanate] Other (See Comments) 09/03/2013  . Banana Other (See Comments) 11/09/2013  . Fish allergy Hives 12/04/2013  . Onion Rash 12/04/2013   Past Medical History  Diagnosis Date  . Shingles < 2010  . HIV infection ([place]) dx'd 10/2013  . Cellulitis 11/2013    LLE  . CAP (community acquired pneumonia) 10/2013  . DVT (deep venous thrombosis) ([place]) 10/2013    BLE  . Multiple food allergies   . Multiple environmental allergies     "trees, dogs, cats"  . Hypertension   . Fever   . Leg pain   . Chronic kidney disease   . 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 Asthma   . Headache    Past Surgical History  Procedure Laterality Date  . Femur fracture surgery Right 09/2001  . Fracture surgery    . I&d extremity Right 12/18/2014     Procedure: IRRIGATION AND DEBRIDEMENT RIGHT LOWER EXTREMITY;  Surgeon: Ralene Ok, MD;  Location: [place];  Service: General;  Laterality: Right;  . Insertion of dialysis catheter  2016  . Av fistula placement Left 03/03/2015    Procedure: Creation of Left Arm RADIOCEPHALIC ARTERIOVENOUS FISTULA ;  Surgeon: Mal Misty, MD;  Location: [facility] OR;  Service: Vascular;  Laterality: Left;   Family History  Problem Relation Age of Onset  . Diabetes Maternal Grandmother   . Kidney disease Maternal Grandmother   . Hypertension Mother    Social History   Social History  . Marital Status: Single    Spouse Name: N/A  . Number of Children: N/A  . Years of Education: N/A  Occupational History  . Not on file.   Social History Main Topics  . Smoking status: Never Smoker   . Smokeless tobacco: Never Used  . Alcohol Use: Yes     Comment: I stopped drinking along time ago "  . Drug Use: No  . Sexual Activity: Not on file   Other Topics Concern  . Not on file   Social History Narrative    Review of Systems: Pertinent items are noted in HPI.  Physical Exam: Blood pressure 160/107, pulse 85, temperature 98.6 F (37 C), temperature source Oral, resp. rate 16, height 5\' 7"  (1.702 m), weight 150 lb (68.04 kg), SpO2 100 %. General: lying in bed, tired appearing, alert and oriented HEENT: EOMI, no scleral icterus, fundoscopic exam negative Cardiac: RRR, no rubs, murmurs or gallops Pulm: clear to auscultation bilaterally, moving normal volumes of air Abd: soft, nondistended, BS present, mild diffuse tenderness Ext: warm and well perfused, mild pedal edema.  Left AV fistula with thrill, mild swelling of left forearm Neuro: alert and oriented X3, cranial nerves II-XII grossly intact Skin: warm, dry, intact  Lab results: Basic Metabolic Panel:  Recent Labs  07/06/15 1100  NA 137  K 3.9  CL 99*  CO2 27  GLUCOSE 95  BUN 42*  CREATININE 8.99*  CALCIUM 8.4*   Liver Function  Tests:  Recent Labs  07/06/15 1100  AST 11*  ALT 10*  ALKPHOS 41  BILITOT 1.5*  PROT 8.5*  ALBUMIN 3.1*    Recent Labs  07/06/15 1100  LIPASE 33   CBC:  Recent Labs  07/06/15 1100  WBC 4.4  HGB 10.5*  HCT 31.6*  MCV 83.6  PLT 48*   Cardiac Enzymes:  Recent Labs  07/06/15 1425  TROPONINI 0.04*    Imaging results:  Ct Head Wo Contrast  07/06/2015  CLINICAL DATA:  Headache since last Tuesday. Pt also hypertensive Hx: HTN, Dialysis, HIV infection EXAM: CT HEAD WITHOUT CONTRAST TECHNIQUE: Contiguous axial images were obtained from the base of the skull through the vertex without intravenous contrast. COMPARISON:  04/10/2008 FINDINGS: Brain: No evidence of acute infarction, hemorrhage, extra-axial collection, ventriculomegaly, or mass effect. Vascular: No hyperdense vessel or unexpected calcification. Skull: Negative for fracture or focal lesion. Sinuses/Orbits: Mild mucoperiosteal thickening in visualized maxillary sinuses with changes of previous right maxillary antrectomy. Other: None. IMPRESSION: 1. Negative for bleed or other acute intracranial process. Electronically Signed   By: Lucrezia Europe M.D.   On: 07/06/2015 12:59    Other results: EKG: sinus rhythm, prolonged QT, ? 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  Neurologic exam is presently unremarkable.  No fever, neck stiffness.  Symptoms may be related to non-adherence with blood pressure medications as his BP is quite elevated at admission.  May  also be due to dehydration for poor PO intake and vomiting.  Due to his immune compromise, intracranial OI is also on the differential despite no focal neurologic deficits or other symptoms to suggest an acute infectious illness.  Patient does have history of previous OI.  At present, patient is mentating appropriately, non-toxic appearing - MRI brain without contrast to help rule out mass or CNS infection - check cryptococcal antigen, hold off on LP for now unless neuro exam changes - check Toxoplasma IgG - check influenza - UDS - consider ophthamology consult in AM for proper fundoscopic exam - consider checking CMV IgM - blood pressure control - Dilaudid prn for now  Nausea & Vomiting: patient reports poor PO intake over the past week but no loss of appetite and is currently asking to eat.  No recent sick contacts, no diarrhea to suggest a gastroenteritis - clear liquids as tolerated - pherergan or compazine q6h prn  EKG Changes: showing possible new T-wave inversions previously not seen on prior EKGs.  He is not having any chest pain and low suspicion for cardiac event.  A troponin checked in the ED was 0.04. - trend troponins - telemetry - repeat EKG in AM  HTN: patient on Carvedilol 12.5mg  BID but has not taken for past week - restart home meds - consider adding hydralazine if needed  HIV Disease: patient has not been taking ART since March and has been non-adherent in the past.  Supposed to be on rilpivirine, Tivicay, and Prezcobix.  He also has not been taking his Dapsone for OI prophylaxis. - hold HIV meds as he is not taking at home - check CD4, VL  ESRD: he is on Tues, Thurs, Sat dialysis.  Went today but did not get full treatment.  Otherwise had not been in past week.  Had pain and swelling in left arm today during HD resulting in stopping treatment.  Ultrasound showed no evidence of DVT or superficial thrombosis - nephrology consult in the morning  Normocytic Anemia:  hemoglobin 10.5 on admission which is better than previous.  Likely 2/2 to chronic disease - follow for now  Thrombocytopenia: chronic, no signs of obvious bleeding.  Likely 2/2 to chronic disease state - follow for now  Diet: Clear Liquid  Code: Full  DVT PPx: SCD   Dispo: Disposition is deferred at this time, awaiting improvement of current medical problems. Anticipated discharge in approximately 2-3 day(s).   The patient does have a current PCP ([name], MD) and does not need an [facility] hospital follow-up appointment after discharge.  The patient does not have transportation limitations that hinder transportation to clinic appointments.  Signed: Jule Ser, DO 07/06/2015, 8:47 PM

## 2015-07-06 NOTE — ED Notes (Signed)
Onset one week ago nausea and emesis denies diarrhea. Has dialysis on Tues/Thurs/Sat did not go for 8 days due to symptoms. Went to dialysis today used left arm access swelling developed left forearm pain 9/10 throbbing radial bruit felt. Dialysis switch to right chest dialysis access. Total time 1:15 mins. Stopped due to patient complaints of headache and blurry vision.  Currently headache 10/10 throbbing. Alert answering and following commands appropriate.

## 2015-07-06 NOTE — ED Provider Notes (Signed)
CSN: [id number]     Arrival date & time 07/06/15  1034 History   First MD Initiated Contact with Patient 07/06/15 1059     Chief Complaint  Patient presents with  . Headache  . Emesis  . Vascular Access Problem     (Consider location/radiation/quality/duration/timing/severity/associated sxs/prior Treatment) HPI Patient states he had a severe headache that started 1 week ago. He denies significant history of headaches. He reports it's severe and generalized. He reports he missed dialysis due to discomfort and feeling sick. Today he went to dialysis but did not make it through his full treatment. He reports his arm started to swell at his access site in the fistula. He reports when that happened his headache got much more intense again. He reports he has had nausea and about 2 episodes of vomiting per day. He states 3 days ago he developed blurred vision. He does not endorse focal weakness numbness or extremity dysfunction. He does however report when he is been up and trying to move around he gets dizzy. He reports due to his symptoms he has not been able to take his medications regularly and has not really been able to eat. He has not had fever or neck stiffness. He has tried Tylenol for pain with no relief. He endorses photophobia. Past Medical History  Diagnosis Date  . Shingles < 2010  . HIV infection ([place]) dx'd 10/2013  . Cellulitis 11/2013    LLE  . CAP (community acquired pneumonia) 10/2013  . DVT (deep venous thrombosis) ([place]) 10/2013    BLE  . Multiple food allergies   . Multiple environmental allergies     "trees, dogs, cats"  . Hypertension   . Fever   . Leg pain   . Chronic kidney disease   . 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 Asthma   . Headache    Past Surgical History  Procedure Laterality Date  . Femur fracture surgery Right 09/2001  . Fracture surgery    . I&d extremity Right 12/18/2014    Procedure: IRRIGATION AND DEBRIDEMENT RIGHT LOWER EXTREMITY;   Surgeon: Ralene Ok, MD;  Location: [place];  Service: General;  Laterality: Right;  . Insertion of dialysis catheter  2016  . Av fistula placement Left 03/03/2015    Procedure: Creation of Left Arm RADIOCEPHALIC ARTERIOVENOUS FISTULA ;  Surgeon: Mal Misty, MD;  Location: [facility] OR;  Service: Vascular;  Laterality: Left;   Family History  Problem Relation Age of Onset  . Diabetes Maternal Grandmother   . Kidney disease Maternal Grandmother   . Hypertension Mother    Social History  Substance Use Topics  . Smoking status: Never Smoker   . Smokeless tobacco: Never Used  . Alcohol Use: Yes     Comment: I stopped drinking along time ago "    Review of Systems 10 Systems reviewed and are negative for acute change except as noted in the HPI.    Allergies  Bactrim; Cauliflower; Vancomycin; Amlodipine; Augmentin; Banana; Fish allergy; and Onion  Home Medications   Prior to Admission medications   Medication Sig Start Date End Date Taking? Authorizing Provider  acetaminophen (TYLENOL) 500 MG tablet Take 1,000 mg by mouth every 6 (six) hours as needed (pain).   Yes Historical Provider, MD  albuterol (PROVENTIL HFA;VENTOLIN HFA) 108 (90 BASE) MCG/ACT inhaler Inhale 2 puffs into the lungs every 6 (six) hours as needed for wheezing or shortness of breath. 08/02/14  Yes Costin Karlyne Greenspan, MD  carvedilol (COREG) 12.5  MG tablet Take 12.5 mg by mouth 2 (two) times daily. Reported on 04/22/2015 01/28/15  Yes Historical Provider, MD  ondansetron (ZOFRAN ODT) 8 MG disintegrating tablet Take 1 tablet (8 mg total) by mouth every 8 (eight) hours as needed for nausea or vomiting. 04/22/15  Yes Carlyle Basques, MD  dapsone 100 MG tablet Take 1 tablet (100 mg total) by mouth daily. Patient not taking: Reported on 07/06/2015 04/22/15   Carlyle Basques, MD  darunavir-cobicistat (PREZCOBIX) 800-150 MG tablet Take 1 tablet by mouth daily. Swallow whole. Do NOT crush, break or chew tablets. Take with food. Patient not  taking: Reported on 07/06/2015 04/22/15   Carlyle Basques, MD  dolutegravir (TIVICAY) 50 MG tablet Take 1 tablet (50 mg total) by mouth daily. Patient not taking: Reported on 07/06/2015 04/22/15   Carlyle Basques, MD  rilpivirine (EDURANT) 25 MG TABS tablet Take 1 tablet (25 mg total) by mouth daily with breakfast. Patient not taking: Reported on 07/06/2015 04/22/15   Carlyle Basques, MD   BP 162/119 mmHg  Pulse 87  Temp(Src) 98.6 F (37 C) (Oral)  Resp 13  Ht 5\' 7"  (1.702 m)  Wt 150 lb (68.04 kg)  BMI 23.49 kg/m2  SpO2 100% Physical Exam  Constitutional: He is oriented to person, place, and time.  Awake, alert, nontoxic appearance. Patient appears to be in pain but is not toxic appearing. Mental status is awake and alert.  HENT:  Head: Normocephalic and atraumatic.  Nose: Nose normal.  Mouth/Throat: Oropharynx is clear and moist. No oropharyngeal exudate.  Eyes: Conjunctivae and EOM are normal. Pupils are equal, round, and reactive to light. Right eye exhibits no discharge. Left eye exhibits no discharge.  Neck: Neck supple.  Cardiovascular: Normal rate, regular rhythm, normal heart sounds and intact distal pulses.   Pulmonary/Chest: Effort normal and breath sounds normal. He exhibits no tenderness.  Abdominal: Soft. He exhibits no distension. There is no tenderness. There is no rebound.  Musculoskeletal: Normal range of motion. He exhibits tenderness.  Baseline ROM, no obvious new focal weakness. Patient has a AV fistula on the right forearm. He reports significant pain with palpation on the volar proximal aspect of his forearm. There is palpable thrill in the fistula. I do not appreciate warmth or crepitus. He did not have any active bleeding, there are to clean dressings over his access sites. No lower extremity edema or calf tenderness.  Neurological: He is alert and oriented to person, place, and time. No cranial nerve deficit. He exhibits normal muscle tone.  Mental status and motor strength  appears baseline for patient and situation.  Skin: Skin is warm and dry. No rash noted.  Psychiatric: He has a normal mood and affect.  Nursing note and vitals reviewed.   ED Course  Procedures (including critical care time) CRITICAL CARE Performed by: Charlesetta Shanks   Total critical care time: 45 minutes  Critical care time was exclusive of separately billable procedures and treating other patients.  Critical care was necessary to treat or prevent imminent or life-threatening deterioration.  Critical care was time spent personally by me on the following activities: development of treatment plan with patient and/or surrogate as well as nursing, discussions with consultants, evaluation of patient's response to treatment, examination of patient, obtaining history from patient or surrogate, ordering and performing treatments and interventions, ordering and review of laboratory studies, ordering and review of radiographic studies, pulse oximetry and re-evaluation of patient's condition. Labs Review Labs Reviewed  COMPREHENSIVE METABOLIC PANEL - Abnormal; Notable  for the following:    Chloride 99 (*)    BUN 42 (*)    Creatinine, Ser 8.99 (*)    Calcium 8.4 (*)    Total Protein 8.5 (*)    Albumin 3.1 (*)    AST 11 (*)    ALT 10 (*)    Total Bilirubin 1.5 (*)    GFR calc non Af Amer 7 (*)    GFR calc Af Amer 8 (*)    All other components within normal limits  CBC - Abnormal; Notable for the following:    RBC 3.78 (*)    Hemoglobin 10.5 (*)    HCT 31.6 (*)    RDW 16.8 (*)    Platelets 48 (*)    All other components within normal limits  TROPONIN I - Abnormal; Notable for the following:    Troponin I 0.04 (*)    All other components within normal limits  LIPASE, BLOOD    Imaging Review Ct Head Wo Contrast  07/06/2015  CLINICAL DATA:  Headache since last Tuesday. Pt also hypertensive Hx: HTN, Dialysis, HIV infection EXAM: CT HEAD WITHOUT CONTRAST TECHNIQUE: Contiguous axial  images were obtained from the base of the skull through the vertex without intravenous contrast. COMPARISON:  04/10/2008 FINDINGS: Brain: No evidence of acute infarction, hemorrhage, extra-axial collection, ventriculomegaly, or mass effect. Vascular: No hyperdense vessel or unexpected calcification. Skull: Negative for fracture or focal lesion. Sinuses/Orbits: Mild mucoperiosteal thickening in visualized maxillary sinuses with changes of previous right maxillary antrectomy. Other: None. IMPRESSION: 1. Negative for bleed or other acute intracranial process. Electronically Signed   By: Lucrezia Europe M.D.   On: 07/06/2015 12:59   I have personally reviewed and evaluated these images and lab results as part of my medical decision-making.   EKG Interpretation   Date/Time:  Tuesday Jul 06 2015 11:50:31 EDT Ventricular Rate:  86 PR Interval:  193 QRS Duration: 86 QT Interval:  413 QTC Calculation: 494 R Axis:   95 Text Interpretation:  Sinus rhythm Probable left atrial enlargement  Borderline right axis deviation Abnormal T, consider ischemia, diffuse  leads Prolonged QT interval agree. t wave inversion. new compared with old  Confirmed by Johnney Killian, MD, Jeannie Done [phone]) on 07/06/2015 12:20:42 PM      Recheck: Patient reported significant improvement with Reglan, Benadryl and labetalol. Mental status remains clear. Consult: Dr. Redmond Pulling internal medicine service has evaluated the patient for admission.. MDM   Final diagnoses:  Hypertensive urgency  Acute intractable headache, unspecified headache type  ESRD on dialysis [facility])   Patient presents with a weeklong headache. Symptoms have incrementally developed. Blurred vision developed about 3 days after onset of headache. He has not had fever, neck stiffness or loss of vision. Highest suspicion is for hypertension related headache. Patient's mental status is clear and neurologic examination is intact. Patient has been noncompliant with blood pressure  medications and pressure is significantly elevated. Patient also has HIV/AIDS. At this time, I did feel that intracranial infection was of somewhat lower probability given clear mental status, no localizing neurologic symptoms, no fever or signs of acute infectious illness. Patient will be admitted for ongoing management. Patient has been given labetalol for blood pressure management and Reglan and Benadryl for headache.    Charlesetta Shanks, MD 07/06/15 [phone]

## 2015-07-06 NOTE — ED Notes (Signed)
Patient transported to MRI 

## 2015-07-06 NOTE — H&P (Signed)
Date: 07/06/2015               Patient Name:  Jim Wyatt MRN: [id number]  DOB: 06/05/1980 Age / Sex: 35 y.o., male   PCP: [name], MD              Medical Service: Internal Medicine Teaching Service              Attending Physician: Dr. Charlesetta Shanks, MD    First Contact: Devonne Doughty, MS 3 Pager: [phone]  Second Contact: Dr. Jule Ser Pager: [phone]  Third Contact Dr. 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  He says headaches began last Tuesday.  He describes headache as a throbbing, unremitting pain that has been constant for the past week, worsening slightly this morning.  He says pain is predominantly in the "middle of my brain" and he points to the frontal area of his skull.  He rates pain 7/10.  He has taken Tylenol with no relief.  Nothing seems to make the pain better.  Changing positions makes the pain worse.  Coughing and laughing do not make the pain worse.  He has had headaches in the past, but never this intense for so long. He also states he has had blurry vision over the past week associated with headaches.  He has never had blurry vision in the past and does not wear glasses/contacts.  He denies confusion, changes in mental status, stiff neck, fevers, chills.  He endorses some gait instability but says this is not new.  He does not have a history of migraines.   Pt also complains of one week of nausea and vomiting that began on the same day as his headaches.  Pt says he has about 2 episodes of non-bloody vomiting per day.  Vomiting is associated with liquid and food intake.  He says he cannot keep any  food, liquid, or medication down.  He has not taken anything to alleviate the pain and says the Zofran he received in the ED has not helped.  He says he's had similar episodes of nausea and vomiting earlier this year due to HAART side effects.  Anti-emetics helped relieved symptoms at this time but he can't remember what specific medication helped.  Pt states he has a good appetite but constantly vomits back up whatever he attempts to eat.  He has lost 6 pounds in the past week.  Denies diarrhea or constipation.  No sick contacts.      Of note, patient discontinued his HIV medications and dapsone in March due to nausea and vomiting.  He has not seen his ID doctor since February, when his CD4 count was 20.  His CD4 count has been <100 for the past year.     Patient is also on T/Th/Sat dialysis for CKD.  He missed his dialysis appointments last week due to headache, nausea and vomiting.  He presented to dialysis today, where he developed pain and discomfort at L fistula access site when dialysis began.  Dialysis was aborted due to patient pain and headache.  No bleeding from fistula site.  His last complete dialysis session was last Tuesday.   Pt states he began having SOB this morning prior to dialysis - no cough or CP.  Denies any specific recent sick contacts, although he says his coworkers are often sick. He works as an Media planner and was well enough to go into work up until today, despite constant headache, nausea, and vomiting.   Also spoke to patient's fiance.  She says pt began developing headaches over the past month and n/v for the past week.  She also notes he frequently has chills but does not know if he's had any fevers.  She says he is not very compliant with his medicines but that he doesn't like discussing his health with her.   Meds: Current Facility-Administered Medications  Medication Dose Route Frequency Provider Last Rate Last Dose  . carvedilol (COREG) tablet 12.5 mg  12.5 mg  Oral BID WC Charlesetta Shanks, MD   12.5 mg at 07/06/15 1520   Current Outpatient Prescriptions  Medication Sig Dispense Refill  . acetaminophen (TYLENOL) 500 MG tablet Take 1,000 mg by mouth every 6 (six) hours as needed (pain).    [name] albuterol (PROVENTIL HFA;VENTOLIN HFA) 108 (90 BASE) MCG/ACT inhaler Inhale 2 puffs into the lungs every 6 (six) hours as needed for wheezing or shortness of breath. 1 Inhaler 2  . carvedilol (COREG) 12.5 MG tablet Take 12.5 mg by mouth 2 (two) times daily. Reported on 04/22/2015  12  . ondansetron (ZOFRAN ODT) 8 MG disintegrating tablet Take 1 tablet (8 mg total) by mouth every 8 (eight) hours as needed for nausea or vomiting. 30 tablet 3  . dapsone 100 MG tablet Take 1 tablet (100 mg total) by mouth daily. (Patient not taking: Reported on 07/06/2015) 30 tablet 1  . darunavir-cobicistat (PREZCOBIX) 800-150 MG tablet Take 1 tablet by mouth daily. Swallow whole. Do NOT crush, break or chew tablets. Take with food. (Patient not taking: Reported on 07/06/2015) 30 tablet 11  . dolutegravir (TIVICAY) 50 MG tablet Take 1 tablet (50 mg total) by mouth daily. (Patient not taking: Reported on 07/06/2015) 30 tablet 11  . rilpivirine (EDURANT) 25 MG TABS tablet Take 1 tablet (25 mg total) by mouth daily with breakfast. (Patient not taking: Reported on 07/06/2015) 30 tablet 11    Allergies: Allergies as of 07/06/2015 - Review Complete 07/06/2015  Allergen Reaction Noted  . Bactrim [sulfamethoxazole-trimethoprim] Other (See Comments) 11/14/2013  . Cauliflower [brassica oleracea italica] Anaphylaxis and Other (See Comments) 11/10/2013  . Vancomycin Other (See Comments) 12/04/2013  . Amlodipine Palpitations 01/12/2015  . Augmentin [amoxicillin-pot clavulanate] Other (See Comments) 09/03/2013  . Banana Other (See Comments) 11/09/2013  . Fish allergy Hives 12/04/2013  . Onion Rash 12/04/2013   Past Medical History  Diagnosis Date  . Shingles < 2010  . HIV infection ([facility]) dx'd 10/2013    . Cellulitis 11/2013    LLE  . CAP (community acquired pneumonia) 10/2013  . DVT (deep venous thrombosis) ([place]) 10/2013    BLE  . Multiple food allergies   . Multiple environmental allergies     "trees, dogs, cats"  . Hypertension   . Fever   . Leg pain   . Chronic kidney disease   . 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 Asthma   . Headache    Past Surgical History  Procedure Laterality Date  . Femur fracture surgery Right 09/2001  . Fracture surgery    . I&d extremity Right 12/18/2014    Procedure: IRRIGATION AND DEBRIDEMENT  RIGHT LOWER EXTREMITY;  Surgeon: Ralene Ok, MD;  Location: [facility];  Service: General;  Laterality: Right;  . Insertion of dialysis catheter  2016  . Av fistula placement Left 03/03/2015    Procedure: Creation of Left Arm RADIOCEPHALIC ARTERIOVENOUS FISTULA ;  Surgeon: Mal Misty, MD;  Location: [facility] OR;  Service: Vascular;  Laterality: Left;   Family History  Problem Relation Age of Onset  . Diabetes Maternal Grandmother   . Kidney disease Maternal Grandmother   . Hypertension Mother    Social History   Social History  . Marital Status: Single    Spouse Name: N/A  . Number of Children: N/A  . Years of Education: N/A   Occupational History  . Not on file.   Social History Main Topics  . Smoking status: Never Smoker   . Smokeless tobacco: Never Used  . Alcohol Use: Yes     Comment: I stopped drinking along time ago "  . Drug Use: No  . Sexual Activity: Not on file   Other Topics Concern  . Not on file   Social History Narrative    Review of Systems: Pertinent items noted in HPI and remainder of comprehensive ROS otherwise negative.  Physical Exam: Blood pressure 162/119, pulse 87, temperature 98.6 F (37 C), temperature source Oral, resp. rate 13, height 5\' 7"  (1.702 m), weight 68.04 kg (150 lb), SpO2 100 %. BP 160/107 mmHg  Pulse 85  Temp(Src) 98.6 F (37 C) (Oral)  Resp 16  Ht 5\' 7"  (1.702 m)  Wt 68.04 kg (150 lb)   BMI 23.49 kg/m2  SpO2 100%  General Appearance:    Fatigued, alert, cooperative  Head:    Normocephalic, without obvious abnormality, atraumatic  Eyes:    PERRL, conjunctiva/corneas clear, EOM's intact, fundi    benign, both eyes       Neck:   Supple, symmetrical, trachea midline, no adenopathy;       thyroid:  No enlargement/tenderness/nodules; no carotid   bruit or JVD  Lungs:     Clear to auscultation bilaterally, respirations unlabored  Heart:    Regular rate and rhythm.  No murmur  Abdomen:    Soft, nondistended.  Mild tenderness to palpation in all 4 quadrants.    Extremities:   Extremities normal, atraumatic, no cyanosis or edema  Pulses:   2+ and symmetric radial pulses   Skin:   Skin color, texture, turgor normal, no rashes or lesions  Neurologic:   CN III, IV, V, VI, VII, IX, X intact.  Normal strength and sensation throughout.  5/5 strength in upper and lower extremities.    Lab results: CBC    Component Value Date/Time   WBC 4.4 07/06/2015 1100   RBC 3.78* 07/06/2015 1100   RBC <1.00* 01/12/2015 1617   HGB 10.5* 07/06/2015 1100   HCT 31.6* 07/06/2015 1100   PLT 48* 07/06/2015 1100   MCV 83.6 07/06/2015 1100   MCH 27.8 07/06/2015 1100   MCHC 33.2 07/06/2015 1100   RDW 16.8* 07/06/2015 1100   LYMPHSABS 1.6 02/25/2015 1215   MONOABS 0.6 02/25/2015 1215   EOSABS 0.1 02/25/2015 1215   BASOSABS 0.0 02/25/2015 1215    BMP Latest Ref Rng 07/06/2015  Glucose 65 - 99 mg/dL 95  BUN 6 - 20 mg/dL 42(H)  Creatinine 0.61 - 1.24 mg/dL 8.99(H)  Sodium 135 - 145 mmol/L 137  Potassium 3.5 - 5.1 mmol/L 3.9  Chloride 101 - 111 mmol/L 99(L)  CO2 22 - 32  mmol/L 27  Calcium 8.9 - 10.3 mg/dL 8.4(L)   Lipase: 33 Troponin: 0.04  Imaging results:  Ct Head Wo Contrast  07/06/2015  CLINICAL DATA:  Headache since last Tuesday. Pt also hypertensive Hx: HTN, Dialysis, HIV infection EXAM: CT HEAD WITHOUT CONTRAST TECHNIQUE: Contiguous axial images were obtained from the base of the skull  through the vertex without intravenous contrast. COMPARISON:  04/10/2008 FINDINGS: Brain: No evidence of acute infarction, hemorrhage, extra-axial collection, ventriculomegaly, or mass effect. Vascular: No hyperdense vessel or unexpected calcification. Skull: Negative for fracture or focal lesion. Sinuses/Orbits: Mild mucoperiosteal thickening in visualized maxillary sinuses with changes of previous right maxillary antrectomy. Other: None. IMPRESSION: 1. Negative for bleed or other acute intracranial process. Electronically Signed   By: Lucrezia Europe M.D.   On: 07/06/2015 12:59   Other results: EKG: Sinus rhythm Probable left atrial enlargement  Borderline right axis deviation Abnormal T, consider ischemia, diffuse  leads Prolonged QT interval   Assessment & Plan by Problem: 35 yo M with hx of HIV/CD4<50 and Stage 5 CKD presenting with 1 week of Ha, worsening today, as well as 1 week of n/v.    Headache/blurry vision Given patient's headache and most recent CD4< 20, concerned for an opportunistic infection such as toxoplasmosis vs. Cryptococcal meningitis vs. CMV.  Less concerned for meningitis given pt afebrile and denies nuchal rigidity.  Lack of fever would also point away from infectious cause, but patient's immunocompromised status necessitates full workup to rule out infection.  CNS lymphoma also on differential given headaches in an immunocompromised patient.  Further imaging will be helpful in teasing out potential CNS causes of headache.  Headache may also be secondary due dehydration from vomiting.  Pt has been unable to keep down liquids for the past week, putting him at risk of dehydration and subsequent headaches.  Of note, patient is fairly conversant and does not appear toxic- decreasing concern for acute systemic infection.  -MRI brain without contrast (in setting of CKD, contrast is contraindicated). Should help rule out mass or CNS infection.  -Cryptococcal antigen -Toxoplasma gondii  Ab -Influenza panel  -UDS   Nausea/vomiting Nausea and vomiting associated with dietary intake is concerning for PUD vs. Gastroparesis.  PUD would explain vomiting after meals, but would expect poor appetite in anticipation of vomiting.  Gastroparesis may be less likely because no history of DM.  Not concerned about drug side effect because he has not started any new medications recently.  No sick contacts and no diarrhea, so less concerned about gastroenteritis.  -Clear liquids as tolerated -Zofran PRN   SOB  Likely secondary to pain during dialysis this morning.  EKG ruled out MI.  May be asthma exacerbation.  -Requests to be on nasal cannula for now  HIV  Has not been on HAART since March.  No recent illnesses until this past week.  -CD4 count/HIV viral load  -do NOT restart HAART at this time  HTN Pt has been taking Carvedilol as prescribed.  Systolics have been in the 160s since admission.   -Carvedilol 12.5mg  q2  CKD Diagnosed with CKD in January 2017.  T/Th/Sun dialysis - noncompliant over past week.  -Avoid contrast and IVF unless necessary  This is a Careers information officer Note.  The care of the patient was discussed with Dr. Jule Ser and the assessment and plan was formulated with their assistance.  Please see their note for official documentation of the patient encounter.   Signed: Randal Buba, Med Student 07/06/2015, 4:35  PM  

## 2015-07-06 NOTE — ED Notes (Signed)
Pt back in room.

## 2015-07-06 NOTE — Progress Notes (Signed)
VASCULAR LAB PRELIMINARY  PRELIMINARY  PRELIMINARY  PRELIMINARY  Left upper extremity venous duplex completed.    Left arm-  No evidence of DVT or superficial thrombosis.   Other finding patent throughout the AVF. Radial artery connected to cephalic forearm. Bandage on patient mid forearm test was limited.  Janifer Adie, RVT, RDMS 07/06/2015, 4:39 PM

## 2015-07-06 NOTE — ED Notes (Signed)
Attempted report x1. 

## 2015-07-07 ENCOUNTER — Inpatient Hospital Stay (HOSPITAL_COMMUNITY): Payer: BLUE CROSS/BLUE SHIELD

## 2015-07-07 DIAGNOSIS — R112 Nausea with vomiting, unspecified: Secondary | ICD-10-CM

## 2015-07-07 DIAGNOSIS — H538 Other visual disturbances: Secondary | ICD-10-CM

## 2015-07-07 DIAGNOSIS — B20 Human immunodeficiency virus [HIV] disease: Secondary | ICD-10-CM

## 2015-07-07 DIAGNOSIS — Z9119 Patient's noncompliance with other medical treatment and regimen: Secondary | ICD-10-CM

## 2015-07-07 DIAGNOSIS — R9431 Abnormal electrocardiogram [ECG] [EKG]: Secondary | ICD-10-CM

## 2015-07-07 DIAGNOSIS — H539 Unspecified visual disturbance: Secondary | ICD-10-CM

## 2015-07-07 DIAGNOSIS — N186 End stage renal disease: Secondary | ICD-10-CM

## 2015-07-07 DIAGNOSIS — Z9115 Patient's noncompliance with renal dialysis: Secondary | ICD-10-CM

## 2015-07-07 DIAGNOSIS — Z992 Dependence on renal dialysis: Secondary | ICD-10-CM

## 2015-07-07 DIAGNOSIS — I120 Hypertensive chronic kidney disease with stage 5 chronic kidney disease or end stage renal disease: Secondary | ICD-10-CM

## 2015-07-07 DIAGNOSIS — D649 Anemia, unspecified: Secondary | ICD-10-CM

## 2015-07-07 DIAGNOSIS — I160 Hypertensive urgency: Secondary | ICD-10-CM | POA: Insufficient documentation

## 2015-07-07 DIAGNOSIS — R519 Headache, unspecified: Secondary | ICD-10-CM | POA: Insufficient documentation

## 2015-07-07 DIAGNOSIS — Z21 Asymptomatic human immunodeficiency virus [HIV] infection status: Secondary | ICD-10-CM

## 2015-07-07 DIAGNOSIS — R51 Headache: Principal | ICD-10-CM

## 2015-07-07 LAB — TOXOPLASMA GONDII ANTIBODY, IGG: Toxoplasma IgG Ratio: <3 IU/mL (ref 0.0–7.1)

## 2015-07-07 LAB — CBC WITH DIFFERENTIAL/PLATELET
Basophils Absolute: 0 10*3/uL (ref 0.0–0.1)
Basophils Relative: 1 %
Eosinophils Absolute: 0.3 10*3/uL (ref 0.0–0.7)
Eosinophils Relative: 10 %
HCT: 30.1 % — ABNORMAL LOW (ref 39.0–52.0)
Hemoglobin: 9.7 g/dL — ABNORMAL LOW (ref 13.0–17.0)
Lymphocytes Relative: 15 %
Lymphs Abs: 0.4 10*3/uL — ABNORMAL LOW (ref 0.7–4.0)
MCH: 26.9 pg (ref 26.0–34.0)
MCHC: 32.2 g/dL (ref 30.0–36.0)
MCV: 83.6 fL (ref 78.0–100.0)
Monocytes Absolute: 0.5 10*3/uL (ref 0.1–1.0)
Monocytes Relative: 16 %
Neutro Abs: 1.7 10*3/uL (ref 1.7–7.7)
Neutrophils Relative %: 57 %
Platelets: 54 10*3/uL — ABNORMAL LOW (ref 150–400)
RBC: 3.6 MIL/uL — ABNORMAL LOW (ref 4.22–5.81)
RDW: 16.5 % — ABNORMAL HIGH (ref 11.5–15.5)
WBC: 2.9 10*3/uL — ABNORMAL LOW (ref 4.0–10.5)

## 2015-07-07 LAB — CBC
HCT: 30.6 % — ABNORMAL LOW (ref 39.0–52.0)
Hemoglobin: 10.1 g/dL — ABNORMAL LOW (ref 13.0–17.0)
MCH: 27.7 pg (ref 26.0–34.0)
MCHC: 33 g/dL (ref 30.0–36.0)
MCV: 84.1 fL (ref 78.0–100.0)
Platelets: 76 10*3/uL — ABNORMAL LOW (ref 150–400)
RBC: 3.64 MIL/uL — ABNORMAL LOW (ref 4.22–5.81)
RDW: 16.5 % — ABNORMAL HIGH (ref 11.5–15.5)
WBC: 4 10*3/uL (ref 4.0–10.5)

## 2015-07-07 LAB — T-HELPER CELLS (CD4) COUNT (NOT AT ARMC)
CD4 % Helper T Cell: 3 % — ABNORMAL LOW (ref 33–55)
CD4 T Cell Abs: 10 /uL — ABNORMAL LOW (ref 400–2700)

## 2015-07-07 LAB — HIV-1 RNA QUANT-NO REFLEX-BLD
HIV 1 RNA Quant: 86000 copies/mL
LOG10 HIV-1 RNA: 4.934 log10copy/mL

## 2015-07-07 LAB — RAPID URINE DRUG SCREEN, HOSP PERFORMED
Amphetamines: NOT DETECTED
Barbiturates: NOT DETECTED
Benzodiazepines: NOT DETECTED
Cocaine: NOT DETECTED
Opiates: POSITIVE — AB
Tetrahydrocannabinol: NOT DETECTED

## 2015-07-07 LAB — RENAL FUNCTION PANEL
Albumin: 3 g/dL — ABNORMAL LOW (ref 3.5–5.0)
Anion gap: 13 (ref 5–15)
BUN: 54 mg/dL — ABNORMAL HIGH (ref 6–20)
CO2: 25 mmol/L (ref 22–32)
Calcium: 8.8 mg/dL — ABNORMAL LOW (ref 8.9–10.3)
Chloride: 101 mmol/L (ref 101–111)
Creatinine, Ser: 11.46 mg/dL — ABNORMAL HIGH (ref 0.61–1.24)
GFR calc Af Amer: 6 mL/min — ABNORMAL LOW (ref >60)
GFR calc non Af Amer: 5 mL/min — ABNORMAL LOW (ref >60)
Glucose, Bld: 79 mg/dL (ref 65–99)
Phosphorus: 6.7 mg/dL — ABNORMAL HIGH (ref 2.5–4.6)
Potassium: 4.3 mmol/L (ref 3.5–5.1)
Sodium: 139 mmol/L (ref 135–145)

## 2015-07-07 LAB — PROTIME-INR
INR: 1.1 (ref 0.00–1.49)
Prothrombin Time: 14.4 seconds (ref 11.6–15.2)

## 2015-07-07 LAB — TYPE AND SCREEN
ABO/RH(D): O POS
Antibody Screen: NEGATIVE

## 2015-07-07 LAB — INFLUENZA PANEL BY PCR (TYPE A & B)
H1N1 flu by pcr: NOT DETECTED
Influenza A By PCR: NEGATIVE
Influenza B By PCR: NEGATIVE

## 2015-07-07 LAB — CSF CELL COUNT WITH DIFFERENTIAL
RBC Count, CSF: 5 /mm3 — ABNORMAL HIGH
Tube #: 1
WBC, CSF: 2 /mm3 (ref 0–5)

## 2015-07-07 LAB — MRSA PCR SCREENING: MRSA by PCR: POSITIVE — AB

## 2015-07-07 LAB — TROPONIN I: Troponin I: 0.04 ng/mL — ABNORMAL HIGH (ref <0.031)

## 2015-07-07 LAB — GLUCOSE, CSF: Glucose, CSF: 52 mg/dL (ref 40–70)

## 2015-07-07 LAB — PROTEIN, CSF: Total  Protein, CSF: 81 mg/dL — ABNORMAL HIGH (ref 15–45)

## 2015-07-07 MED ORDER — RENA-VITE PO TABS
1.0000 | ORAL_TABLET | Freq: Every day | ORAL | Status: DC
  Administered 2015-07-08 – 2015-07-09 (×2): 1 via ORAL
  Filled 2015-07-07 (×2): qty 1

## 2015-07-07 MED ORDER — HYDRALAZINE HCL 20 MG/ML IJ SOLN
5.0000 mg | Freq: Four times a day (QID) | INTRAMUSCULAR | Status: DC
  Administered 2015-07-07 (×2): 5 mg via INTRAVENOUS
  Filled 2015-07-07 (×2): qty 1

## 2015-07-07 MED ORDER — HYDROMORPHONE HCL 1 MG/ML IJ SOLN
0.5000 mg | Freq: Once | INTRAMUSCULAR | Status: DC
  Filled 2015-07-07: qty 1

## 2015-07-07 MED ORDER — DIPHENHYDRAMINE HCL 50 MG/ML IJ SOLN: 12.5000 mg | Freq: Once | INTRAMUSCULAR | Status: DC

## 2015-07-07 MED ORDER — HYDRALAZINE HCL 20 MG/ML IJ SOLN
10.0000 mg | Freq: Four times a day (QID) | INTRAMUSCULAR | Status: DC
  Administered 2015-07-07 – 2015-07-08 (×3): 10 mg via INTRAVENOUS
  Filled 2015-07-07 (×3): qty 1

## 2015-07-07 MED ORDER — SODIUM CHLORIDE 0.9 % IV SOLN
Freq: Once | INTRAVENOUS | Status: AC
  Administered 2015-07-07: 17:00:00 via INTRAVENOUS

## 2015-07-07 MED ORDER — METOCLOPRAMIDE HCL 5 MG/ML IJ SOLN: 10.0000 mg | Freq: Once | INTRAMUSCULAR | Status: DC

## 2015-07-07 MED ORDER — CALCITRIOL 0.25 MCG PO CAPS
0.2500 ug | ORAL_CAPSULE | ORAL | Status: DC
  Administered 2015-07-08 – 2015-07-10 (×2): 0.25 ug via ORAL
  Filled 2015-07-07 (×2): qty 1

## 2015-07-07 MED ORDER — LABETALOL HCL 5 MG/ML IV SOLN
10.0000 mg | INTRAVENOUS | Status: DC | PRN
  Administered 2015-07-08: 10 mg via INTRAVENOUS
  Filled 2015-07-07: qty 4

## 2015-07-07 NOTE — Consult Note (Signed)
[facility] Renal Consultation Note  Indication for Consultation:  Management of ESRD/hemodialysis; anemia, hypertension/volume and secondary hyperparathyroidism  HPI: Jim Wyatt is a 35 y.o. 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 He went to [facility]est ([facility]) after missing txs on 5/06  and 5/04  With HO multple Missed txs over past month. His L FA AVF infiltrated but  used his R IJ Perm cath .Have used his AVF multiple times with plans to remove his perm cath  This week as op . Pre HD  BP 209/127 and below his edw  By 1 kg.He complained  of severe headache , blurred vision  And signed off hd after 2hr 20 min and came to ER. He denies fevers, chills, chest pain , dizziness, abdominal pain. He reports missing intermittent Hd txs because" feels too weak."      In the ER/hypertensive and given labetalol and clonidine / given anti-emetics/CT  Hd was negative. EKG showed possible new T-wave inversions that were new. Troponin was 0.04. CBC=WBC 4.4, Hgb 10.5, platelets 48. K+ 3.9, creatine 8.9.CXR No active dz.      Past Medical History  Diagnosis Date  . Shingles < 2010  . HIV infection ([place]) dx'd 10/2013  . Cellulitis 11/2013    LLE  . CAP (community acquired pneumonia) 10/2013  . DVT (deep venous thrombosis) ([place]) 10/2013    BLE  . Multiple food allergies   . Multiple environmental allergies     "trees, dogs, cats"  . Hypertension   . Fever   . Leg pain   . Chronic kidney disease   . 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 Asthma   . Headache     Past Surgical History  Procedure Laterality Date  . Femur fracture surgery Right 09/2001  . Fracture surgery    . I&d extremity Right 12/18/2014    Procedure: IRRIGATION AND DEBRIDEMENT RIGHT LOWER EXTREMITY;  Surgeon: Ralene Ok, MD;  Location: [place];  Service: General;  Laterality: Right;  . Insertion of  dialysis catheter  2016  . Av fistula placement Left 03/03/2015    Procedure: Creation of Left Arm RADIOCEPHALIC ARTERIOVENOUS FISTULA ;  Surgeon: Mal Misty, MD;  Location[facility] OR;  Service: Vascular;  Laterality: Left;      Family History  Problem Relation Age of Onset  . Diabetes Maternal Grandmother   . Kidney disease Maternal Grandmother   . Hypertension Mother       reports that he has never smoked. He has never used smokeless tobacco. He reports that he drinks alcohol. He reports that he does not use illicit drugs.   Allergies  Allergen Reactions  . Bactrim [Sulfamethoxazole-Trimethoprim] Other (See Comments)    Renal failure  . Cauliflower [Brassica Oleracea Italica] Anaphylaxis and Other (See Comments)    Pt states his throat closes up  . Vancomycin Other (See Comments)    Patient states vancomycin caused kidney injury  . Amlodipine Palpitations    fatigue  . Augmentin [Amoxicillin-Pot Clavulanate] Other (See Comments)    unknown  . Banana Other (See Comments)  . Fish Allergy Hives  . Onion Rash    Prior to Admission medications   Medication Sig Start Date End Date Taking? Authorizing Provider  acetaminophen (TYLENOL) 500 MG tablet Take 1,000 mg by mouth every 6 (six) hours as needed (pain).   Yes Historical Provider,  MD  albuterol (PROVENTIL HFA;VENTOLIN HFA) 108 (90 BASE) MCG/ACT inhaler Inhale 2 puffs into the lungs every 6 (six) hours as needed for wheezing or shortness of breath. 08/02/14  Yes Costin Karlyne Greenspan, MD  carvedilol (COREG) 12.5 MG tablet Take 12.5 mg by mouth 2 (two) times daily. Reported on 04/22/2015 01/28/15  Yes Historical Provider, MD  ondansetron (ZOFRAN ODT) 8 MG disintegrating tablet Take 1 tablet (8 mg total) by mouth every 8 (eight) hours as needed for nausea or vomiting. 04/22/15  Yes Carlyle Basques, MD  dapsone 100 MG tablet Take 1 tablet (100 mg total) by mouth daily. Patient not taking: Reported on 07/06/2015 04/22/15   Carlyle Basques, MD   darunavir-cobicistat (PREZCOBIX) 800-150 MG tablet Take 1 tablet by mouth daily. Swallow whole. Do NOT crush, break or chew tablets. Take with food. Patient not taking: Reported on 07/06/2015 04/22/15   Carlyle Basques, MD  dolutegravir (TIVICAY) 50 MG tablet Take 1 tablet (50 mg total) by mouth daily. Patient not taking: Reported on 07/06/2015 04/22/15   Carlyle Basques, MD  rilpivirine [facility]) 25 MG TABS tablet Take 1 tablet (25 mg total) by mouth daily with breakfast. Patient not taking: Reported on 07/06/2015 04/22/15   Carlyle Basques, MD     Results for orders placed or performed during the hospital encounter of 07/06/15 (from the past 48 hour(s))  Lipase, blood     Status: None   Collection Time: 07/06/15 11:00 AM  Result Value Ref Range   Lipase 33 11 - 51 U/L  Comprehensive metabolic panel     Status: Abnormal   Collection Time: 07/06/15 11:00 AM  Result Value Ref Range   Sodium 137 135 - 145 mmol/L   Potassium 3.9 3.5 - 5.1 mmol/L   Chloride 99 (L) 101 - 111 mmol/L   CO2 27 22 - 32 mmol/L   Glucose, Bld 95 65 - 99 mg/dL   BUN 42 (H) 6 - 20 mg/dL   Creatinine, Ser 8.99 (H) 0.61 - 1.24 mg/dL   Calcium 8.4 (L) 8.9 - 10.3 mg/dL   Total Protein 8.5 (H) 6.5 - 8.1 g/dL   Albumin 3.1 (L) 3.5 - 5.0 g/dL   AST 11 (L) 15 - 41 U/L   ALT 10 (L) 17 - 63 U/L   Alkaline Phosphatase 41 38 - 126 U/L   Total Bilirubin 1.5 (H) 0.3 - 1.2 mg/dL   GFR calc non Af Amer 7 (L) >60 mL/min   GFR calc Af Amer 8 (L) >60 mL/min    Comment: (NOTE) The eGFR has been calculated using the CKD EPI equation. This calculation has not been validated in all clinical situations. eGFR's persistently <60 mL/min signify possible Chronic Kidney Disease.    Anion gap 11 5 - 15  CBC     Status: Abnormal   Collection Time: 07/06/15 11:00 AM  Result Value Ref Range   WBC 4.4 4.0 - 10.5 K/uL   RBC 3.78 (L) 4.22 - 5.81 MIL/uL   Hemoglobin 10.5 (L) 13.0 - 17.0 g/dL   HCT 31.6 (L) 39.0 - 52.0 %   MCV 83.6 78.0 - 100.0  fL   MCH 27.8 26.0 - 34.0 pg   MCHC 33.2 30.0 - 36.0 g/dL   RDW 16.8 (H) 11.5 - 15.5 %   Platelets 48 (L) 150 - 400 K/uL    Comment: PLATELET COUNT CONFIRMED BY SMEAR  Troponin I     Status: Abnormal   Collection Time: 07/06/15  2:25 PM  Result  Value Ref Range   Troponin I 0.04 (H) <0.031 ng/mL    Comment:        PERSISTENTLY INCREASED TROPONIN VALUES IN THE RANGE OF 0.04-0.49 ng/mL CAN BE SEEN IN:       -UNSTABLE ANGINA       -CONGESTIVE HEART FAILURE       -MYOCARDITIS       -CHEST TRAUMA       -ARRYHTHMIAS       -LATE PRESENTING MYOCARDIAL INFARCTION       -COPD   CLINICAL FOLLOW-UP RECOMMENDED.   T-helper cells (CD4) count (not at [facility])     Status: Abnormal   Collection Time: 07/06/15  5:31 PM  Result Value Ref Range   CD4 T Cell Abs 10 (L) 400 - 2700 /uL   CD4 % Helper T Cell 3 (L) 33 - 55 %    Comment: Performed at [facility]  Toxoplasma gondii antibody, IgG     Status: None   Collection Time: 07/06/15  5:31 PM  Result Value Ref Range   Toxoplasma IgG Ratio <3.0 0.0 - 7.1 IU/mL    Comment: (NOTE)                                 Negative        <7.2                                 Equivocal  7.2 - 8.7                                 Positive        >8.7 Performed At: [facility] [place], [place] [id number] Lindon Romp MD [id number]   Cryptococcal antigen     Status: None   Collection Time: 07/06/15 10:10 PM  Result Value Ref Range   Crypto Ag NEGATIVE NEGATIVE   Cryptococcal Ag Titer NOT INDICATED NOT INDICATED  Troponin I (q 6hr x 3)     Status: Abnormal   Collection Time: 07/06/15 10:10 PM  Result Value Ref Range   Troponin I 0.04 (H) <0.031 ng/mL  Influenza panel by PCR (type A & B, H1N1)     Status: None   Collection Time: 07/06/15 11:31 PM  Result Value Ref Range   Influenza A By PCR NEGATIVE NEGATIVE   Influenza B By PCR NEGATIVE NEGATIVE   H1N1 flu by pcr NOT DETECTED NOT DETECTED    Comment:         The Xpert Flu assay (FDA approved for nasal aspirates or washes and nasopharyngeal swab specimens), is intended as an aid in the diagnosis of influenza and should not be used as a sole basis for treatment.   Troponin I (q 6hr x 3)     Status: Abnormal   Collection Time: 07/07/15  2:50 AM  Result Value Ref Range   Troponin I 0.04 (H) <0.031 ng/mL    Comment:        PERSISTENTLY INCREASED TROPONIN VALUES IN THE RANGE OF 0.04-0.49 ng/mL CAN BE SEEN IN:       -UNSTABLE ANGINA       -CONGESTIVE HEART FAILURE       -MYOCARDITIS       -CHEST TRAUMA       -  ARRYHTHMIAS       -LATE PRESENTING MYOCARDIAL INFARCTION       -COPD   CLINICAL FOLLOW-UP RECOMMENDED.   CBC with Differential/Platelet     Status: Abnormal   Collection Time: 07/07/15  2:50 AM  Result Value Ref Range   WBC 2.9 (L) 4.0 - 10.5 K/uL   RBC 3.60 (L) 4.22 - 5.81 MIL/uL   Hemoglobin 9.7 (L) 13.0 - 17.0 g/dL   HCT 30.1 (L) 39.0 - 52.0 %   MCV 83.6 78.0 - 100.0 fL   MCH 26.9 26.0 - 34.0 pg   MCHC 32.2 30.0 - 36.0 g/dL   RDW 16.5 (H) 11.5 - 15.5 %   Platelets 54 (L) 150 - 400 K/uL    Comment: REPEATED TO VERIFY CONSISTENT WITH PREVIOUS RESULT    Neutrophils Relative % 57 %   Neutro Abs 1.7 1.7 - 7.7 K/uL   Lymphocytes Relative 15 %   Lymphs Abs 0.4 (L) 0.7 - 4.0 K/uL   Monocytes Relative 16 %   Monocytes Absolute 0.5 0.1 - 1.0 K/uL   Eosinophils Relative 10 %   Eosinophils Absolute 0.3 0.0 - 0.7 K/uL   Basophils Relative 1 %   Basophils Absolute 0.0 0.0 - 0.1 K/uL  Renal function panel     Status: Abnormal   Collection Time: 07/07/15  2:50 AM  Result Value Ref Range   Sodium 139 135 - 145 mmol/L   Potassium 4.3 3.5 - 5.1 mmol/L   Chloride 101 101 - 111 mmol/L   CO2 25 22 - 32 mmol/L   Glucose, Bld 79 65 - 99 mg/dL   BUN 54 (H) 6 - 20 mg/dL   Creatinine, Ser 11.46 (H) 0.61 - 1.24 mg/dL   Calcium 8.8 (L) 8.9 - 10.3 mg/dL   Phosphorus 6.7 (H) 2.5 - 4.6 mg/dL   Albumin 3.0 (L) 3.5 - 5.0 g/dL   GFR calc  non Af Amer 5 (L) >60 mL/min   GFR calc Af Amer 6 (L) >60 mL/min    Comment: (NOTE) The eGFR has been calculated using the CKD EPI equation. This calculation has not been validated in all clinical situations. eGFR's persistently <60 mL/min signify possible Chronic Kidney Disease.    Anion gap 13 5 - 15  Urine rapid drug screen (hosp performed)     Status: Abnormal   Collection Time: 07/07/15  7:59 AM  Result Value Ref Range   Opiates POSITIVE (A) NONE DETECTED   Cocaine NONE DETECTED NONE DETECTED   Benzodiazepines NONE DETECTED NONE DETECTED   Amphetamines NONE DETECTED NONE DETECTED   Tetrahydrocannabinol NONE DETECTED NONE DETECTED   Barbiturates NONE DETECTED NONE DETECTED    Comment:        DRUG SCREEN FOR MEDICAL PURPOSES ONLY.  IF CONFIRMATION IS NEEDED FOR ANY PURPOSE, NOTIFY LAB WITHIN 5 DAYS.        LOWEST DETECTABLE LIMITS FOR URINE DRUG SCREEN Drug Class       Cutoff (ng/mL) Amphetamine      1000 Barbiturate      200 Benzodiazepine   774 Tricyclics       128 Opiates          300 Cocaine          300 THC              50   Protime-INR     Status: None   Collection Time: 07/07/15 12:01 PM  Result Value Ref Range   Prothrombin Time 14.4  11.6 - 15.2 seconds   INR 1.10 0.00 - 1.49    ROS: see hpi  Physical Exam: Filed Vitals:   07/07/15 1030 07/07/15 1031  BP: 173/110   Pulse: 83   Temp:    Resp:  11     General: alert thin AA M  Chronically  ill appearing NAD HEENT: EOMI, no icterus, MM dry  Cardiac: RRR, no rubs, murmurs or gallops Pulm: CTA  Bilaterally,nonlabored breathjing Abd: soft, nondistended, BS + normal, mild diffuse tenderness Extremities: trace  pedal edema. Left AV fistula with thrill, mild swelling of left forearm Neuro: alert/ oriented X3,  Moves all exterm. No gross focal deficits Skin: warm, dry, no overt rash, no pedal ulcera Dialysis Access L FA AVF pos bruit  And swelling sec Infiltration appearance / R IL perm  No dc    Dialysis Orders: [facility]   [facility] . EDW = 71.0 ( yesrt pre 69.9) HD Bath 2k,2ca  Time 3hr 63mn Heparin 5000. Access LFA AVF and R IJ perm cath     Clacitriol 0.289m po /HD   Other  Op labs = hgb 11.2 5/02 ca 9.3  Phos 6.8  pth 508<402  Assessment/Plan 1. Headaches/ Blurry Vision/ HTN= per Admit  Team wu= element of Uremia and htn playing some role in  2. ESRD -  HD TTS use permcath with avf infiltration yest. Ice  Pack to swollen AVF 3. Hypertension/volume  - volume ok is below edw/ needs meds ?? Compliance as op  4. Anemia of ESRD  - HGB 9.7 no eas at op start Aranesp in hosp/  5. Metabolic bone disease -  Po vit d on hd , needs binders when eating 6. Thrombocytopenia= per admit team 7. HIV = per admit, noted he Has not taken ART since March and has been non-adherent in the past   DaErnest HaberPA-C [phone]/11/2015, 12:58 PM

## 2015-07-07 NOTE — Progress Notes (Signed)
Pt back from IR 

## 2015-07-07 NOTE — Progress Notes (Signed)
Pt vomited once. md made aware. md aware also that pt is not complaint with strict bed rest. md will change order.   Pt has yellow socks on, bed alarm, and fall mat in room.   Plan is for pt to get 1 time extra dose for pain right before he goes down for MRI. rn called MRI and will see when MRI can be performed to plan this.

## 2015-07-07 NOTE — Care Management Note (Signed)
Case Management Note  Patient Details  Name: Jim Wyatt MRN: [id number] Date of Birth: [birth year]  Subjective/Objective:   Pt admitted with Acquired immune deficiency syndrome. He is from home with friends.                  Action/Plan: Continue medical work up. CM following for discharge needs.   Expected Discharge Date:                  Expected Discharge Plan:     In-House Referral:     Discharge planning Services     Post Acute Care Choice:    Choice offered to:     DME Arranged:    DME Agency:     HH Arranged:    HH Agency:     Status of Service:  In process, will continue to follow  Medicare Important Message Given:    Date Medicare IM Given:    Medicare IM give by:    Date Additional Medicare IM Given:    Additional Medicare Important Message give by:     If discussed at [place] of Stay Meetings, dates discussed:    Additional Comments:  Pollie Friar, RN 07/07/2015, 3:23 PM

## 2015-07-07 NOTE — Progress Notes (Signed)
Pt refusing to wear yellow socks

## 2015-07-07 NOTE — Progress Notes (Signed)
Pt bed alarm activated, pt adamant that he is going to get out of bed to go to restroom and wash hands, Is somewhat respectful but non compliant with strict bedrest order. Pt did ambulate to restroom with steady gait and w/o assist and back to bed w/o incident.

## 2015-07-07 NOTE — Progress Notes (Addendum)
Pt  reported sudden onset SOB to tech. Tech informed rn, tech obtained vitals. 100% on room air. Placed on 2 L. rn assessed, pt R 11/min. No distress noted.   md paged and made aware

## 2015-07-07 NOTE — Progress Notes (Signed)
   07/07/15 2207  Vitals  Temp 98 F (36.7 C)  Temp Source Oral  BP (!) 153/109 mmHg (RN notified)  BP Location Right Arm  BP Method Automatic  Patient Position (if appropriate) Lying  Pulse Rate 82  Pulse Rate Source Dinamap  Resp 18  Oxygen Therapy  SpO2 98 %  O2 Device Nasal Cannula  O2 Flow Rate (L/min) 2 L/min   Teaching services paged.

## 2015-07-07 NOTE — Progress Notes (Signed)
Per infection prevention, droplet precautions can be discontinued.

## 2015-07-07 NOTE — Progress Notes (Signed)
Visitor came out and said pt was ready to talk to nurse.  rn went in and explained phlebotomy was going to draw blood work, that pt had order for platelet transfusion and a lumbar puncture. Pt agreeable to plan. Will get pt to sign blood/platelet consent once type and screen performed.

## 2015-07-07 NOTE — Progress Notes (Signed)
Radiology called and stated pt must have a bedside LP attempt before IR will do LP. rn explained pts comorbidities. Protocol is still 1 attempt bedside LP.  Will alert md.  Dr Johnnye Sima made aware and called IR to see if IR initially could do LP. No definite answer at this time.

## 2015-07-07 NOTE — Progress Notes (Signed)
Pt to IR

## 2015-07-07 NOTE — Consult Note (Signed)
[facility]  Date of Admission:  07/06/2015  Date of Consult:  07/07/2015  Reason for Consult:AIDS Referring Physician: Daryll Drown  Impression/Recommendation AIDS (prev DRVc/RPV/DTGV) ESRD Headache N/v Worsened vision  Would Check BCx Check doppler LUE MRI of head Check LP (esp for HSV pcr, Crypto, JC pcr, routine Cx, CMV pcr) after PLT transfusion Agree with holding ART for now.  Hold atbx unless he clinically changes (fever, mental status change), until he has LP (then start vanco/ceftriaxone/amp). Would ask for pharmacy assistance for these due to his "allergy" history.   Comment Broad ddx on this pt. His low CD4 count would suggest that he may have an atypical presentation of meningitis as he may lack the inflammatory response to make this.  I do not believe it is safe to do his LP at bedside due to his low PLT count. WIll need PLT transfusion prior and if bedside fails, he would need 2 plt transfusions.   Thank you so much for this interesting consult,   Bobby Rumpf (pager) [phone] [email or web address]  Jim Wyatt is an 35 y.o. male.  HPI: 35 yo M with AIDS, ESRD, who has been off ART since 03-2015, adm on 5-9 with headaches for 1 month and n/v for the last week. He also has had blurry vision which is new. He has had chills but has not had fever. He has missed HD appts this week and finally his work, due to feeling unwell.  In ED he was found to have nl CT head. WBC normal, PLT 48.   HIV 1 RNA QUANT (copies/mL)  Date Value  04/22/2015 83181*  01/11/2015 114123*  08/26/2014 1419*   CD4 T CELL ABS (/uL)  Date Value  07/06/2015 10*  04/22/2015 20*  01/11/2015 50*      Past Medical History  Diagnosis Date  . Shingles < 2010  . HIV infection ([place]) dx'd 10/2013  . Cellulitis 11/2013    LLE  . CAP (community acquired pneumonia) 10/2013  . DVT (deep venous thrombosis) ([place]) 10/2013    BLE  . Multiple food allergies   .  Multiple environmental allergies     "trees, dogs, cats"  . Hypertension   . Fever   . Leg pain   . Chronic kidney disease   . 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 Asthma   . Headache     Past Surgical History  Procedure Laterality Date  . Femur fracture surgery Right 09/2001  . Fracture surgery    . I&d extremity Right 12/18/2014    Procedure: IRRIGATION AND DEBRIDEMENT RIGHT LOWER EXTREMITY;  Surgeon: Ralene Ok, MD;  Location: [facility];  Service: General;  Laterality: Right;  . Insertion of dialysis catheter  2016  . Av fistula placement Left 03/03/2015    Procedure: Creation of Left Arm RADIOCEPHALIC ARTERIOVENOUS FISTULA ;  Surgeon: Mal Misty, MD;  Location: [facility];  Service: Vascular;  Laterality: Left;     Allergies  Allergen Reactions  . Bactrim [Sulfamethoxazole-Trimethoprim] Other (See Comments)    Renal failure  . Cauliflower [Brassica Oleracea Italica] Anaphylaxis and Other (See Comments)    Pt states his throat closes up  . Vancomycin Other (See Comments)    Patient states vancomycin caused kidney injury  . Amlodipine Palpitations    fatigue  . Augmentin [Amoxicillin-Pot Clavulanate] Other (See Comments)    unknown  . Banana Other (See Comments)  . Fish Allergy Hives  . Onion Rash    Medications:  Scheduled: . sodium chloride   Intravenous Once  . [START ON 07/08/2015] calcitRIOL  0.25 mcg Oral Q T,Th,Sa-HD  . carvedilol  12.5 mg Oral BID WC  . hydrALAZINE  10 mg Intravenous Q6H  .  HYDROmorphone (DILAUDID) injection  0.5 mg Intravenous Once  . [START ON 07/08/2015] multivitamin  1 tablet Oral QHS  . sodium chloride flush  3 mL Intravenous Q12H    Abtx:  Anti-infectives    None      Total days of antibiotics: 0          Social History:  reports that he has never smoked. He has never used smokeless tobacco. He reports that he drinks alcohol. He reports that he does not use illicit drugs.  Family History  Problem Relation Age of Onset    . Diabetes Maternal Grandmother   . Kidney disease Maternal Grandmother   . Hypertension Mother     General ROS: see HPI. no oral ulcers. ART gave him n/v. no loose BM, brusing LUE AVF. see HPI  Blood pressure 180/108, pulse 87, temperature 98.4 F (36.9 C), temperature source Oral, resp. rate 16, height '5\' 7"'  (1.702 m), weight 68.176 kg (150 lb 4.8 oz), SpO2 98 %. General appearance: alert, cooperative, fatigued and mild distress Eyes: negative findings: pupils equal, round, reactive to light and accomodation and no photophobia Throat: normal findings: oropharynx pink & moist without lesions or evidence of thrush Neck: no adenopathy, supple, symmetrical, trachea midline and FROM without resistance to movement Lungs: clear to auscultation bilaterally and R chest HD line- clean, no d/c. present since Dec 2016 Heart: regular rate and rhythm Abdomen: normal findings: bowel sounds normal and soft, non-tender Extremities: edema none LE and LUE AVF swollen, bruising. non-tender. +thrill/bruit Neurologic: Mental status: Alert, oriented, thought content appropriate Sensory: normal Motor: grossly normal   Results for orders placed or performed during the hospital encounter of 07/06/15 (from the past 48 hour(s))  Lipase, blood     Status: None   Collection Time: 07/06/15 11:00 AM  Result Value Ref Range   Lipase 33 11 - 51 U/L  Comprehensive metabolic panel     Status: Abnormal   Collection Time: 07/06/15 11:00 AM  Result Value Ref Range   Sodium 137 135 - 145 mmol/L   Potassium 3.9 3.5 - 5.1 mmol/L   Chloride 99 (L) 101 - 111 mmol/L   CO2 27 22 - 32 mmol/L   Glucose, Bld 95 65 - 99 mg/dL   BUN 42 (H) 6 - 20 mg/dL   Creatinine, Ser 8.99 (H) 0.61 - 1.24 mg/dL   Calcium 8.4 (L) 8.9 - 10.3 mg/dL   Total Protein 8.5 (H) 6.5 - 8.1 g/dL   Albumin 3.1 (L) 3.5 - 5.0 g/dL   AST 11 (L) 15 - 41 U/L   ALT 10 (L) 17 - 63 U/L   Alkaline Phosphatase 41 38 - 126 U/L   Total Bilirubin 1.5 (H) 0.3 -  1.2 mg/dL   GFR calc non Af Amer 7 (L) >60 mL/min   GFR calc Af Amer 8 (L) >60 mL/min    Comment: (NOTE) The eGFR has been calculated using the CKD EPI equation. This calculation has not been validated in all clinical situations. eGFR's persistently <60 mL/min signify possible Chronic Kidney Disease.    Anion gap 11 5 - 15  CBC     Status: Abnormal   Collection Time: 07/06/15 11:00 AM  Result Value Ref Range   WBC 4.4 4.0 -  10.5 K/uL   RBC 3.78 (L) 4.22 - 5.81 MIL/uL   Hemoglobin 10.5 (L) 13.0 - 17.0 g/dL   HCT 31.6 (L) 39.0 - 52.0 %   MCV 83.6 78.0 - 100.0 fL   MCH 27.8 26.0 - 34.0 pg   MCHC 33.2 30.0 - 36.0 g/dL   RDW 16.8 (H) 11.5 - 15.5 %   Platelets 48 (L) 150 - 400 K/uL    Comment: PLATELET COUNT CONFIRMED BY SMEAR  Troponin I     Status: Abnormal   Collection Time: 07/06/15  2:25 PM  Result Value Ref Range   Troponin I 0.04 (H) <0.031 ng/mL    Comment:        PERSISTENTLY INCREASED TROPONIN VALUES IN THE RANGE OF 0.04-0.49 ng/mL CAN BE SEEN IN:       -UNSTABLE ANGINA       -CONGESTIVE HEART FAILURE       -MYOCARDITIS       -CHEST TRAUMA       -ARRYHTHMIAS       -LATE PRESENTING MYOCARDIAL INFARCTION       -COPD   CLINICAL FOLLOW-UP RECOMMENDED.   T-helper cells (CD4) count (not at [facility])     Status: Abnormal   Collection Time: 07/06/15  5:31 PM  Result Value Ref Range   CD4 T Cell Abs 10 (L) 400 - 2700 /uL   CD4 % Helper T Cell 3 (L) 33 - 55 %    Comment: Performed at [facility]  Toxoplasma gondii antibody, IgG     Status: None   Collection Time: 07/06/15  5:31 PM  Result Value Ref Range   Toxoplasma IgG Ratio <3.0 0.0 - 7.1 IU/mL    Comment: (NOTE)                                 Negative        <7.2                                 Equivocal  7.2 - 8.7                                 Positive        >8.7 Performed At: [facility] [place], [place] [id number] Lindon Romp MD TZ:[id number]   Cryptococcal  antigen     Status: None   Collection Time: 07/06/15 10:10 PM  Result Value Ref Range   Crypto Ag NEGATIVE NEGATIVE   Cryptococcal Ag Titer NOT INDICATED NOT INDICATED  Troponin I (q 6hr x 3)     Status: Abnormal   Collection Time: 07/06/15 10:10 PM  Result Value Ref Range   Troponin I 0.04 (H) <0.031 ng/mL  Influenza panel by PCR (type A & B, H1N1)     Status: None   Collection Time: 07/06/15 11:31 PM  Result Value Ref Range   Influenza A By PCR NEGATIVE NEGATIVE   Influenza B By PCR NEGATIVE NEGATIVE   H1N1 flu by pcr NOT DETECTED NOT DETECTED    Comment:        The Xpert Flu assay (FDA approved for nasal aspirates or washes and nasopharyngeal swab specimens), is intended as an aid in the diagnosis of influenza and should not be used as  a sole basis for treatment.   Troponin I (q 6hr x 3)     Status: Abnormal   Collection Time: 07/07/15  2:50 AM  Result Value Ref Range   Troponin I 0.04 (H) <0.031 ng/mL    Comment:        PERSISTENTLY INCREASED TROPONIN VALUES IN THE RANGE OF 0.04-0.49 ng/mL CAN BE SEEN IN:       -UNSTABLE ANGINA       -CONGESTIVE HEART FAILURE       -MYOCARDITIS       -CHEST TRAUMA       -ARRYHTHMIAS       -LATE PRESENTING MYOCARDIAL INFARCTION       -COPD   CLINICAL FOLLOW-UP RECOMMENDED.   CBC with Differential/Platelet     Status: Abnormal   Collection Time: 07/07/15  2:50 AM  Result Value Ref Range   WBC 2.9 (L) 4.0 - 10.5 K/uL   RBC 3.60 (L) 4.22 - 5.81 MIL/uL   Hemoglobin 9.7 (L) 13.0 - 17.0 g/dL   HCT 30.1 (L) 39.0 - 52.0 %   MCV 83.6 78.0 - 100.0 fL   MCH 26.9 26.0 - 34.0 pg   MCHC 32.2 30.0 - 36.0 g/dL   RDW 16.5 (H) 11.5 - 15.5 %   Platelets 54 (L) 150 - 400 K/uL    Comment: REPEATED TO VERIFY CONSISTENT WITH PREVIOUS RESULT    Neutrophils Relative % 57 %   Neutro Abs 1.7 1.7 - 7.7 K/uL   Lymphocytes Relative 15 %   Lymphs Abs 0.4 (L) 0.7 - 4.0 K/uL   Monocytes Relative 16 %   Monocytes Absolute 0.5 0.1 - 1.0 K/uL    Eosinophils Relative 10 %   Eosinophils Absolute 0.3 0.0 - 0.7 K/uL   Basophils Relative 1 %   Basophils Absolute 0.0 0.0 - 0.1 K/uL  Renal function panel     Status: Abnormal   Collection Time: 07/07/15  2:50 AM  Result Value Ref Range   Sodium 139 135 - 145 mmol/L   Potassium 4.3 3.5 - 5.1 mmol/L   Chloride 101 101 - 111 mmol/L   CO2 25 22 - 32 mmol/L   Glucose, Bld 79 65 - 99 mg/dL   BUN 54 (H) 6 - 20 mg/dL   Creatinine, Ser 11.46 (H) 0.61 - 1.24 mg/dL   Calcium 8.8 (L) 8.9 - 10.3 mg/dL   Phosphorus 6.7 (H) 2.5 - 4.6 mg/dL   Albumin 3.0 (L) 3.5 - 5.0 g/dL   GFR calc non Af Amer 5 (L) >60 mL/min   GFR calc Af Amer 6 (L) >60 mL/min    Comment: (NOTE) The eGFR has been calculated using the CKD EPI equation. This calculation has not been validated in all clinical situations. eGFR's persistently <60 mL/min signify possible Chronic Kidney Disease.    Anion gap 13 5 - 15  Urine rapid drug screen (hosp performed)     Status: Abnormal   Collection Time: 07/07/15  7:59 AM  Result Value Ref Range   Opiates POSITIVE (A) NONE DETECTED   Cocaine NONE DETECTED NONE DETECTED   Benzodiazepines NONE DETECTED NONE DETECTED   Amphetamines NONE DETECTED NONE DETECTED   Tetrahydrocannabinol NONE DETECTED NONE DETECTED   Barbiturates NONE DETECTED NONE DETECTED    Comment:        DRUG SCREEN FOR MEDICAL PURPOSES ONLY.  IF CONFIRMATION IS NEEDED FOR ANY PURPOSE, NOTIFY LAB WITHIN 5 DAYS.        LOWEST DETECTABLE LIMITS  FOR URINE DRUG SCREEN Drug Class       Cutoff (ng/mL) Amphetamine      1000 Barbiturate      200 Benzodiazepine   702 Tricyclics       637 Opiates          300 Cocaine          300 THC              50   Protime-INR     Status: None   Collection Time: 07/07/15 12:01 PM  Result Value Ref Range   Prothrombin Time 14.4 11.6 - 15.2 seconds   INR 1.10 0.00 - 1.49  Prepare Pheresed Platelets     Status: None (Preliminary result)   Collection Time: 07/07/15 12:57 PM    Result Value Ref Range   Unit Number [id number]    Blood Component Type PLTPHER LR2    Unit division 00    Status of Unit ALLOCATED    Transfusion Status OK TO TRANSFUSE       Component Value Date/Time   SDES BLOOD RIGHT HAND 02/26/2015 1130   SPECREQUEST BOTTLES DRAWN AEROBIC AND ANAEROBIC 10CC 02/26/2015 1130   CULT NO GROWTH 5 DAYS 02/26/2015 1130   REPTSTATUS 03/03/2015 FINAL 02/26/2015 1130   Ct Head Wo Contrast  07/06/2015  CLINICAL DATA:  Headache since last Tuesday. Pt also hypertensive Hx: HTN, Dialysis, HIV infection EXAM: CT HEAD WITHOUT CONTRAST TECHNIQUE: Contiguous axial images were obtained from the base of the skull through the vertex without intravenous contrast. COMPARISON:  04/10/2008 FINDINGS: Brain: No evidence of acute infarction, hemorrhage, extra-axial collection, ventriculomegaly, or mass effect. Vascular: No hyperdense vessel or unexpected calcification. Skull: Negative for fracture or focal lesion. Sinuses/Orbits: Mild mucoperiosteal thickening in visualized maxillary sinuses with changes of previous right maxillary antrectomy. Other: None. IMPRESSION: 1. Negative for bleed or other acute intracranial process. Electronically Signed   By: Lucrezia Europe M.D.   On: 07/06/2015 12:59   Dg Chest Port 1 View  07/06/2015  CLINICAL DATA:  Dyspnea and shortness of breath for 1 day. On dialysis. EXAM: PORTABLE CHEST 1 VIEW COMPARISON:  02/27/2015 FINDINGS: Heart size remains at the upper limits of normal and stable. Right jugular dialysis catheter remains in appropriate position. Previously seen right upper lobe airspace disease has resolved since prior exam. Both lungs are clear. No evidence pneumothorax or pleural effusion. IMPRESSION: No active disease. Electronically Signed   By: Earle Gell M.D.   On: 07/06/2015 20:55   No results found for this or any previous visit (from the past 240 hour(s)).    07/07/2015, 2:34 PM     LOS: 1 day    Records and images were  personally reviewed where available.

## 2015-07-07 NOTE — Progress Notes (Signed)
Page sent to md asking if pt still needs to be on strict bed rest.

## 2015-07-07 NOTE — Progress Notes (Signed)
Pt bed alarm went off, rn entered, pt sitting on the side of bed. rn informed pt that he was on strict bed rest. Pt not happy with this, saying he will get up no matter what. rn explained that she needed to talk to the doctor about reason for strict bed rest. Pt said "hurry up, you have 2 minutes to figure out". rn asked if pt would not get up while rn was out of room and pt had urinal at bedside. Pt said he would not agree and that now rn had "1 min 37 seconds".  Pt saying he had to wash his hands. rn offered to get a wet wash cloth with soap for pt to wash hands.   This rn came out of room. Night shift rn went into room to remind pt about bed rest.

## 2015-07-07 NOTE — Progress Notes (Addendum)
rn performed nasal swab and swab had blood on it after swabbing left nares. No active bleeding noted from nares.   md made aware

## 2015-07-07 NOTE — Progress Notes (Signed)
rn went into pt room. Pt talking on cell phone. rn told pt she needed to talk to him for a minute.  Pt continued talking on the phone, said "it's going to take a minute" and pointed to cell phone. rn said that she really needed to talk to the patient for a minute. Pt continued to talk on the phone and said "its going to take a minute".  rn said she would come back in 5 minutes.  rn needed to inform pt that he was getting more blood work done for a type and screen and then a platelet transfusion was ordered and rn needed to get informed consent for platet transfusion.

## 2015-07-07 NOTE — Progress Notes (Signed)
Subjective: Pt was seen and examined during morning rounds.  He complains of a headache and blurry vision, but says that Dilaudid helped his headache.  He says he is still nauseous this morning but was able to eat a sandwich yesterday without vomiting.  While examining the patient, he felt like he needed to vomit and starting spitting into the bag without any gross emesis.  He says he slept well overnight.  Pt says he went down for MRI yesterday but the noise of the machine hurt his head too much so he was unable to undergo MRI.  He is amenable to trying MRI again today if he is given pain medicine ahead of time.  Later in the morning, he stated he had SOB which was not relieved with Albuterol.  Oxygen supplementation via nasal cannula improved symptoms immediately.  He denies CP, fevers, chills, or abdominal pain.  BP remained elevated in the 170s overnight and this morning.     Objective: Vital signs in last 24 hours: Filed Vitals:   07/07/15 0811 07/07/15 0954 07/07/15 1030 07/07/15 1031  BP: 174/116 162/106 173/110   Pulse: 88 83 83   Temp: 97.6 F (36.4 C) 97.9 F (36.6 C)    TempSrc: Oral Oral    Resp: 17 17  11   Height:      Weight:      SpO2: 100% 99% 100%    Weight change:   Intake/Output Summary (Last 24 hours) at 07/07/15 1342 Last data filed at 07/06/15 2158  Gross per 24 hour  Intake      3 ml  Output      0 ml  Net      3 ml   General: Cooperative but not very interactive. Lungs: CTAB Heart: RRR. 2+ radial pulses Abdomen: Soft, nondistended.  No TTP  Extremities: Warm, dry.   Lab Results: CBC    Component Value Date/Time   WBC 2.9* 07/07/2015 0250   RBC 3.60* 07/07/2015 0250   RBC <1.00* 01/12/2015 1617   HGB 9.7* 07/07/2015 0250   HCT 30.1* 07/07/2015 0250   PLT 54* 07/07/2015 0250   MCV 83.6 07/07/2015 0250   MCH 26.9 07/07/2015 0250   MCHC 32.2 07/07/2015 0250   RDW 16.5* 07/07/2015 0250   LYMPHSABS 0.4* 07/07/2015 0250   MONOABS 0.5 07/07/2015 0250    EOSABS 0.3 07/07/2015 0250   BASOSABS 0.0 07/07/2015 0250   CMP Latest Ref Rng 07/07/2015 07/06/2015  Glucose 65 - 99 mg/dL 79 95  BUN 6 - 20 mg/dL 54(H) 42(H)  Creatinine 0.61 - 1.24 mg/dL 11.46(H) 8.99(H)  Sodium 135 - 145 mmol/L 139 137  Potassium 3.5 - 5.1 mmol/L 4.3 3.9  Chloride 101 - 111 mmol/L 101 99(L)  CO2 22 - 32 mmol/L 25 27  Calcium 8.9 - 10.3 mg/dL 8.8(L) 8.4(L)  Total Protein 6.5 - 8.1 g/dL - 8.5(H)  Total Bilirubin 0.3 - 1.2 mg/dL - 1.5(H)  Alkaline Phos 38 - 126 U/L - 41  AST 15 - 41 U/L - 11(L)  ALT 17 - 63 U/L - 10(L)    Lipase: 33 Troponin: 0.04, 0.04, 0.04  Toxoplasma IgG ratio: <3.0 (negative) Cryptococcal antigen: negative  Flu panel: negative   CD4 count: 10 (low)   UDS: positive for opiates (drawn after Dilaudid administered)   INR: 1.1   Micro Results: No results found for this or any previous visit (from the past 240 hour(s)). Studies/Results: Ct Head Wo Contrast  07/06/2015  CLINICAL DATA:  Headache  since last Tuesday. Pt also hypertensive Hx: HTN, Dialysis, HIV infection EXAM: CT HEAD WITHOUT CONTRAST TECHNIQUE: Contiguous axial images were obtained from the base of the skull through the vertex without intravenous contrast. COMPARISON:  04/10/2008 FINDINGS: Brain: No evidence of acute infarction, hemorrhage, extra-axial collection, ventriculomegaly, or mass effect. Vascular: No hyperdense vessel or unexpected calcification. Skull: Negative for fracture or focal lesion. Sinuses/Orbits: Mild mucoperiosteal thickening in visualized maxillary sinuses with changes of previous right maxillary antrectomy. Other: None. IMPRESSION: 1. Negative for bleed or other acute intracranial process. Electronically Signed   By: Lucrezia Europe M.D.   On: 07/06/2015 12:59   Dg Chest Port 1 View  07/06/2015  CLINICAL DATA:  Dyspnea and shortness of breath for 1 day. On dialysis. EXAM: PORTABLE CHEST 1 VIEW COMPARISON:  02/27/2015 FINDINGS: Heart size remains at the upper  limits of normal and stable. Right jugular dialysis catheter remains in appropriate position. Previously seen right upper lobe airspace disease has resolved since prior exam. Both lungs are clear. No evidence pneumothorax or pleural effusion. IMPRESSION: No active disease. Electronically Signed   By: Earle Gell M.D.   On: 07/06/2015 20:55   Medications: I have reviewed the patient's current medications. Scheduled Meds: . sodium chloride   Intravenous Once  . [START ON 07/08/2015] calcitRIOL  0.25 mcg Oral Q T,Th,Sa-HD  . carvedilol  12.5 mg Oral BID WC  . hydrALAZINE  5 mg Intravenous Q6H  .  HYDROmorphone (DILAUDID) injection  0.5 mg Intravenous Once  . [START ON 07/08/2015] multivitamin  1 tablet Oral QHS  . sodium chloride flush  3 mL Intravenous Q12H   Continuous Infusions:  PRN Meds:.HYDROmorphone (DILAUDID) injection, promethazine **OR** prochlorperazine **OR** promethazine, sodium chloride flush Assessment/Plan: 35 yo M with hx of HIV/CD4 <20 and Stage 5 CKD presenting with 1 week of Ha, worsening today, as well as 1 week of n/v.   Headache/blurry vision Most concerned for uncontrolled HTN causing headache and symptoms.  Patient does not look toxic and is afebile and not tachycardic.  However, must rule out opportunistic infection in setting of CD4 count of 10 and one week of symptoms.  Cryptococcal antigen negative and toxo IgG negative.  Awaiting results of toxo Ab, crypto Ab.   Have consulted ID who suggests holding off on antibiotic therapy until MRI can be done.  ID recommends LP today.  Given thrombocytopenia, will need platelet transfusion prior to LP, which will be performed by IR.  Optho consulted for complaint of blurry vision - will not see patient until MRI performed.  Have discussed importance of MRI with patient and he is amenable for scan today if he is given pain medicine ahead of time. -ID following, appreciate recs -MRI scan of brain today with adequate pain meds ahead of  time  -LP this afternoon following platelet transfusion. Will be performed by IR, appreciate help.  -Dilaudid PRN for headache  -Cryptococcal Ab, toxo Ab pending  -Hydralazine 5mg  q6 for hypertension -Coreg 12.5mg  bid   Nausea/vomiting Able to tolerate sandwich last night without vomiting.  Had some jello this morning but felt nauseous and nearly vomited. Received compazine 5mg  injection this morning.  -Clear liquids as tolerated -Compazine PRN   HTN Remains hypertensive (systolics in [id number]) despite Coreg 12.5 mg bid.   -Start hydralazine 5mg  q6  -Continue Coreg 12.5 mg bid   CKD Diagnosed with CKD in January 2017. T/Th/Sun dialysis - noncompliant over past week. -Nephrology following, appreciate recs -Will call outpatient dialysis team to alert  that patient is in hospital  [place] in setting of chronic disease -Transfuse platelets prior to LP  HIV  Has not been on HAART since March.  CD4 count of 10.  -do NOT restart HAART at this time  This is a Careers information officer Note.  The care of the patient was discussed with Dr. Jule Ser and the assessment and plan formulated with their assistance.  Please see their attached note for official documentation of the daily encounter.   LOS: 1 day   Randal Buba, Med Student 07/07/2015, 1:42 PM

## 2015-07-07 NOTE — Progress Notes (Signed)
Subjective: Patient seen and examined this morning on rounds.  Still complains of headache and blurry vision.  Reports improvement of symptoms with dilaudid and anti-nausea meds but still is nauseous as well.  He refused MRI last night due to concern that the noise would hur his head.  He is more agreeable to go today.  Also agreeable to LP.  No fever, chills, abdominal pain.  BP still elevated.  Objective: Vital signs in last 24 hours: Filed Vitals:   07/07/15 0954 07/07/15 1030 07/07/15 1031 07/07/15 1402  BP: 162/106 173/110  180/108  Pulse: 83 83  87  Temp: 97.9 F (36.6 C)   98.4 F (36.9 C)  TempSrc: Oral   Oral  Resp: 17  11 16   Height:      Weight:      SpO2: 99% 100%  98%   Weight change:   Intake/Output Summary (Last 24 hours) at 07/07/15 1416 Last data filed at 07/06/15 2158  Gross per 24 hour  Intake      3 ml  Output      0 ml  Net      3 ml   General: resting in bed, not distressed, withdrawn HEENT: EOMI, no scleral icterus Cardiac: RRR, no rubs, murmurs or gallops Pulm: clear to auscultation bilaterally, moving normal volumes of air Abd: soft, nontender, nondistended, BS present Ext: warm and well perfused, no pedal edema Neuro: alert and oriented X3, cranial nerves II-XII grossly intact  Lab Results: Basic Metabolic Panel:  Recent Labs Lab 07/06/15 1100 07/07/15 0250  NA 137 139  K 3.9 4.3  CL 99* 101  CO2 27 25  GLUCOSE 95 79  BUN 42* 54*  CREATININE 8.99* 11.46*  CALCIUM 8.4* 8.8*  PHOS  --  6.7*   Liver Function Tests:  Recent Labs Lab 07/06/15 1100 07/07/15 0250  AST 11*  --   ALT 10*  --   ALKPHOS 41  --   BILITOT 1.5*  --   PROT 8.5*  --   ALBUMIN 3.1* 3.0*    Recent Labs Lab 07/06/15 1100  LIPASE 33   CBC:  Recent Labs Lab 07/06/15 1100 07/07/15 0250  WBC 4.4 2.9*  NEUTROABS  --  1.7  HGB 10.5* 9.7*  HCT 31.6* 30.1*  MCV 83.6 83.6  PLT 48* 54*   Cardiac Enzymes:  Recent Labs Lab 07/06/15 1425  07/06/15 2210 07/07/15 0250  TROPONINI 0.04* 0.04* 0.04*   Coagulation:  Recent Labs Lab 07/07/15 1201  LABPROT 14.4  INR 1.10   Urine Drug Screen: Drugs of Abuse     Component Value Date/Time   LABOPIA POSITIVE* 07/07/2015 0759   COCAINSCRNUR NONE DETECTED 07/07/2015 0759   LABBENZ NONE DETECTED 07/07/2015 0759   AMPHETMU NONE DETECTED 07/07/2015 0759   THCU NONE DETECTED 07/07/2015 0759   LABBARB NONE DETECTED 07/07/2015 0759     Micro Results: No results found for this or any previous visit (from the past 240 hour(s)). Studies/Results: Ct Head Wo Contrast  07/06/2015  CLINICAL DATA:  Headache since last Tuesday. Pt also hypertensive Hx: HTN, Dialysis, HIV infection EXAM: CT HEAD WITHOUT CONTRAST TECHNIQUE: Contiguous axial images were obtained from the base of the skull through the vertex without intravenous contrast. COMPARISON:  04/10/2008 FINDINGS: Brain: No evidence of acute infarction, hemorrhage, extra-axial collection, ventriculomegaly, or mass effect. Vascular: No hyperdense vessel or unexpected calcification. Skull: Negative for fracture or focal lesion. Sinuses/Orbits: Mild mucoperiosteal thickening in visualized maxillary sinuses with changes of  previous right maxillary antrectomy. Other: None. IMPRESSION: 1. Negative for bleed or other acute intracranial process. Electronically Signed   By: Lucrezia Europe M.D.   On: 07/06/2015 12:59   Dg Chest Port 1 View  07/06/2015  CLINICAL DATA:  Dyspnea and shortness of breath for 1 day. On dialysis. EXAM: PORTABLE CHEST 1 VIEW COMPARISON:  02/27/2015 FINDINGS: Heart size remains at the upper limits of normal and stable. Right jugular dialysis catheter remains in appropriate position. Previously seen right upper lobe airspace disease has resolved since prior exam. Both lungs are clear. No evidence pneumothorax or pleural effusion. IMPRESSION: No active disease. Electronically Signed   By: Earle Gell M.D.   On: 07/06/2015 20:55    Medications: I have reviewed the patient's current medications. Scheduled Meds: . sodium chloride   Intravenous Once  . [START ON 07/08/2015] calcitRIOL  0.25 mcg Oral Q T,Th,Sa-HD  . carvedilol  12.5 mg Oral BID WC  . hydrALAZINE  5 mg Intravenous Q6H  .  HYDROmorphone (DILAUDID) injection  0.5 mg Intravenous Once  . [START ON 07/08/2015] multivitamin  1 tablet Oral QHS  . 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  Remained hypertensive overnight on home medications.  Due to his immune compromise, OI remains a concern as well - appreciate ID recommendations - await MRI - LP under image guidance d/t low platelets.  Will give a unit of platelets ahead of time - Dilaudid prn for headache - Continue coreg 12.5mg  BID - Add hydralazine 10mg  Q6H, if not improved may consider adding clonidine - Toxoplasma IgG negative, IgM processing - Cryptococcal antigen negative - discussed with ophthamology who would like MRI results first  Nausea & Vomiting: patient reports poor PO intake over the past week but no loss of appetite and is currently tolerating diet okay.  Did have some emesis this morning. No recent sick contacts, no diarrhea to suggest a gastroenteritis.  May be due to uremia from missed HD sessions. - clear liquids as tolerated - pherergan or  compazine q6h prn  EKG Changes: showing possible new T-wave inversions previously not seen on prior EKGs. He is not having any chest pain and low suspicion for cardiac event. A troponin checked in the ED was 0.04. - troponins overnight were stable - d/c telemetry  HTN: patient on Carvedilol 12.5mg  BID but has not taken for past week - continue carvedilol - added hydralazine as above  HIV Disease: patient has not been taking ART since March and has been non-adherent in the past. Supposed to be on rilpivirine, Tivicay, and Prezcobix. He also has not been taking his Dapsone for OI prophylaxis. - hold HIV meds as he is not taking at home - CD4 10 - VL pending  ESRD: he is on Tues, Thurs, Sat dialysis. He has been non-adherent recently with his HD so symptoms may be in part due to uremia as well - nephrology consult  Normocytic Anemia: hemoglobin  10.5 on admission which is better than previous. Likely 2/2 to chronic disease - follow for now  Thrombocytopenia: chronic, no signs of obvious bleeding. Likely 2/2 to chronic disease state - follow for now  Diet: Clear Liquid  Code: Full  Dispo: Disposition is deferred at this time, awaiting improvement of current medical problems.  Anticipated discharge in approximately 2-3 day(s).   The patient does have a current PCP ([name], MD) and does not need an [facility] hospital follow-up appointment after discharge.  The patient does not have transportation limitations that hinder transportation to clinic appointments.  .Services Needed at time of discharge: Y = Yes, Blank = No PT:   OT:   RN:   Equipment:   Other:     LOS: 1 day   Jule Ser, DO 07/07/2015, 2:16 PM

## 2015-07-07 NOTE — Progress Notes (Signed)
Per infection prevention pt should have MRSA pcr if never performed before.

## 2015-07-07 NOTE — Progress Notes (Signed)
rn called MRI, they had 5 stat MRI lined up in front of pt.  md made aware.

## 2015-07-07 NOTE — Progress Notes (Signed)
Pt 99% on room air but requesting to still wear oxygen [place]

## 2015-07-07 NOTE — Progress Notes (Signed)
Pt stating headache was returning. rn informed pt that he could not receive IV pain medication while platelets were infusing. Pt stated "I am not going to make it". rn informed pt that he could get IV pain medication once infusion was done.

## 2015-07-08 ENCOUNTER — Inpatient Hospital Stay (HOSPITAL_COMMUNITY): Payer: BLUE CROSS/BLUE SHIELD

## 2015-07-08 DIAGNOSIS — G039 Meningitis, unspecified: Secondary | ICD-10-CM

## 2015-07-08 DIAGNOSIS — R519 Headache, unspecified: Secondary | ICD-10-CM | POA: Insufficient documentation

## 2015-07-08 DIAGNOSIS — R51 Headache: Secondary | ICD-10-CM

## 2015-07-08 DIAGNOSIS — H538 Other visual disturbances: Secondary | ICD-10-CM | POA: Insufficient documentation

## 2015-07-08 DIAGNOSIS — D696 Thrombocytopenia, unspecified: Secondary | ICD-10-CM

## 2015-07-08 LAB — PREPARE PLATELET PHERESIS: Unit division: 0

## 2015-07-08 LAB — CRYPTOCOCCAL ANTIGEN, CSF: Crypto Ag: NEGATIVE

## 2015-07-08 LAB — CBC
HCT: 29.7 % — ABNORMAL LOW (ref 39.0–52.0)
Hemoglobin: 9.7 g/dL — ABNORMAL LOW (ref 13.0–17.0)
MCH: 27.5 pg (ref 26.0–34.0)
MCHC: 32.7 g/dL (ref 30.0–36.0)
MCV: 84.1 fL (ref 78.0–100.0)
Platelets: 71 10*3/uL — ABNORMAL LOW (ref 150–400)
RBC: 3.53 MIL/uL — ABNORMAL LOW (ref 4.22–5.81)
RDW: 16.6 % — ABNORMAL HIGH (ref 11.5–15.5)
WBC: 3.9 10*3/uL — ABNORMAL LOW (ref 4.0–10.5)

## 2015-07-08 LAB — VDRL, CSF: VDRL Quant, CSF: NONREACTIVE

## 2015-07-08 LAB — RENAL FUNCTION PANEL
Albumin: 3.1 g/dL — ABNORMAL LOW (ref 3.5–5.0)
Anion gap: 16 — ABNORMAL HIGH (ref 5–15)
BUN: 60 mg/dL — ABNORMAL HIGH (ref 6–20)
CO2: 22 mmol/L (ref 22–32)
Calcium: 9.2 mg/dL (ref 8.9–10.3)
Chloride: 102 mmol/L (ref 101–111)
Creatinine, Ser: 12.74 mg/dL — ABNORMAL HIGH (ref 0.61–1.24)
GFR calc Af Amer: 5 mL/min — ABNORMAL LOW (ref >60)
GFR calc non Af Amer: 5 mL/min — ABNORMAL LOW (ref >60)
Glucose, Bld: 97 mg/dL (ref 65–99)
Phosphorus: 6.6 mg/dL — ABNORMAL HIGH (ref 2.5–4.6)
Potassium: 4.1 mmol/L (ref 3.5–5.1)
Sodium: 140 mmol/L (ref 135–145)

## 2015-07-08 LAB — TOXOPLASMA GONDII ANTIBODY, IGM: Toxoplasma Antibody- IgM: <3 AU/mL (ref 0.0–7.9)

## 2015-07-08 LAB — INFECT DISEASE AB IGM REFLEX 1

## 2015-07-08 MED ORDER — DOLUTEGRAVIR SODIUM 50 MG PO TABS
50.0000 mg | ORAL_TABLET | Freq: Every day | ORAL | Status: DC
  Administered 2015-07-08 – 2015-07-09 (×2): 50 mg via ORAL
  Filled 2015-07-08 (×3): qty 1

## 2015-07-08 MED ORDER — CALCIUM CARBONATE ANTACID 500 MG PO CHEW
200.0000 mg | CHEWABLE_TABLET | Freq: Three times a day (TID) | ORAL | Status: DC
  Administered 2015-07-08 – 2015-07-10 (×7): 200 mg via ORAL
  Filled 2015-07-08 (×7): qty 1

## 2015-07-08 MED ORDER — SODIUM CHLORIDE 0.9 % IV SOLN
500.0000 mg | Freq: Once | INTRAVENOUS | Status: AC
  Administered 2015-07-08: 500 mg via INTRAVENOUS
  Filled 2015-07-08: qty 0.5

## 2015-07-08 MED ORDER — ACETAMINOPHEN 325 MG PO TABS
650.0000 mg | ORAL_TABLET | Freq: Four times a day (QID) | ORAL | Status: DC | PRN
  Administered 2015-07-08 – 2015-07-09 (×5): 650 mg via ORAL
  Filled 2015-07-08 (×5): qty 2

## 2015-07-08 MED ORDER — DARUNAVIR-COBICISTAT 800-150 MG PO TABS
1.0000 | ORAL_TABLET | Freq: Every day | ORAL | Status: DC
  Administered 2015-07-08 – 2015-07-09 (×2): 1 via ORAL
  Filled 2015-07-08 (×2): qty 1

## 2015-07-08 MED ORDER — HYDRALAZINE HCL 25 MG PO TABS
25.0000 mg | ORAL_TABLET | Freq: Four times a day (QID) | ORAL | Status: DC
  Administered 2015-07-08 – 2015-07-10 (×9): 25 mg via ORAL
  Filled 2015-07-08 (×9): qty 1

## 2015-07-08 MED ORDER — RILPIVIRINE HCL 25 MG PO TABS
25.0000 mg | ORAL_TABLET | Freq: Every day | ORAL | Status: DC
  Administered 2015-07-09: 25 mg via ORAL
  Filled 2015-07-08: qty 1

## 2015-07-08 MED ORDER — VANCOMYCIN HCL IN DEXTROSE 750-5 MG/150ML-% IV SOLN
750.0000 mg | INTRAVENOUS | Status: DC | PRN
  Administered 2015-07-09: 750 mg via INTRAVENOUS
  Filled 2015-07-08 (×2): qty 150

## 2015-07-08 MED ORDER — VANCOMYCIN HCL 10 G IV SOLR
1500.0000 mg | Freq: Once | INTRAVENOUS | Status: DC
  Filled 2015-07-08: qty 1500

## 2015-07-08 MED ORDER — SODIUM CHLORIDE 0.9 % IV SOLN
500.0000 mg | INTRAVENOUS | Status: DC
  Administered 2015-07-09 – 2015-07-10 (×2): 500 mg via INTRAVENOUS
  Filled 2015-07-08 (×2): qty 0.5

## 2015-07-08 MED ORDER — VANCOMYCIN HCL 10 G IV SOLR
1500.0000 mg | Freq: Once | INTRAVENOUS | Status: AC
  Administered 2015-07-08: 1500 mg via INTRAVENOUS
  Filled 2015-07-08: qty 1500

## 2015-07-08 NOTE — Progress Notes (Signed)
Pt refused MRI this am. Pt educated, continued to refuse but stated that he may later once his headache is relieved. MRI to be rescheduled at this time.

## 2015-07-08 NOTE — Progress Notes (Signed)
Dressing changed on peripheral IV in LFA, flushed and saline locked. Patient states he hasn't been taking any of his medications due to how sick they make him feel. Patient states he has not spoken with anyone about his HIV diagnosis although he states his girlfriend is aware and has been tested. RN and patient discussed community support groups as well as discussing treatment plan with MD in order to find what is best for him and what works best for treating side effects of HIV medications. Patient covered eyes and began to regress, RN respected patients privacy and reinforced that RN is present if he changes his mind or has any further questions. RN will continue to monitor.

## 2015-07-08 NOTE — Progress Notes (Signed)
INFECTIOUS DISEASE PROGRESS NOTE  ID: Jim Wyatt is a 35 y.o. 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 Will be willing to retry ART if it does not make him nauseated.   Abtx:  Anti-infectives    Start     Dose/Rate Route Frequency Ordered Stop   07/09/15 1400  meropenem (MERREM) 500 mg in sodium chloride 0.9 % 50 mL IVPB     500 mg 100 mL/hr over 30 Minutes Intravenous Every 24 hours 07/08/15 1203     07/09/15 1400  vancomycin (VANCOCIN) IVPB 750 mg/150 ml premix     750 mg 150 mL/hr over 60 Minutes Intravenous Every Dialysis 07/08/15 1206     07/08/15 1545  vancomycin (VANCOCIN) 1,500 mg in sodium chloride 0.9 % 500 mL IVPB     1,500 mg 250 mL/hr over 120 Minutes Intravenous  Once 07/08/15 1536     07/08/15 1315  vancomycin (VANCOCIN) 1,500 mg in sodium chloride 0.9 % 500 mL IVPB  Status:  Discontinued     1,500 mg 250 mL/hr over 120 Minutes Intravenous  Once 07/08/15 1203 07/08/15 1536   07/08/15 1245  meropenem (MERREM) 500 mg in sodium chloride 0.9 % 50 mL IVPB     500 mg 100 mL/hr over 30 Minutes Intravenous  Once 07/08/15 1203 07/08/15 1524      Medications:  Scheduled: . calcitRIOL  0.25 mcg Oral Q T,Th,Sa-HD  . calcium carbonate  200 mg of elemental calcium Oral TID WC  . carvedilol  12.5 mg Oral BID WC  . hydrALAZINE  25 mg Oral Q6H  .  HYDROmorphone (DILAUDID) injection  0.5 mg Intravenous Once  . [START ON 07/09/2015] meropenem (MERREM) IV  500 mg Intravenous Q24H  . multivitamin  1 tablet Oral QHS  . sodium chloride flush  3 mL Intravenous Q12H  . vancomycin  1,500  mg Intravenous Once    Objective: Vital signs in last 24 hours: Temp:  [97.7 F (36.5 C)-98.3 F (36.8 C)] 97.9 F (36.6 C) (05/11 1457) Pulse Rate:  [78-98] 78 (05/11 1457) Resp:  [15-18] 16 (05/11 1457) BP: (150-179)/(92-116) 150/92 mmHg (05/11 1457) SpO2:  [98 %-100 %] 100 % (05/11 1457)   General appearance: alert, cooperative and no distress Neck: no tenderness with motion.  Resp: clear to auscultation bilaterally Cardio: regular rate and rhythm GI: normal findings: bowel sounds normal and soft, non-tender  Lab Results  Recent Labs  07/07/15 0250 07/07/15 2125 07/08/15 0516  WBC 2.9* 4.0 3.9*  HGB 9.7* 10.1* 9.7*  HCT 30.1* 30.6* 29.7*  NA 139  --  140  K 4.3  --  4.1  CL 101  --  102  CO2 25  --  22  BUN 54*  --  60*  CREATININE 11.46*  --  12.74*   Liver Panel  Recent Labs  07/06/15 1100 07/07/15 0250 07/08/15 0516  PROT 8.5*  --   --   ALBUMIN 3.1* 3.0* 3.1*  AST 11*  --   --   ALT 10*  --   --   ALKPHOS 41  --   --  BILITOT 1.5*  --   --    Sedimentation Rate No results for input(s): ESRSEDRATE in the last 72 hours. C-Reactive Protein No results for input(s): CRP in the last 72 hours.  Microbiology: Recent Results (from the past 240 hour(s))  MRSA PCR Screening     Status: Abnormal   Collection Time: 07/07/15 12:59 PM  Result Value Ref Range Status   MRSA by PCR POSITIVE (A) NEGATIVE Final    Comment:        The GeneXpert MRSA Assay (FDA approved for NASAL specimens only), is one component of a comprehensive MRSA colonization surveillance program. It is not intended to diagnose MRSA infection nor to guide or monitor treatment for MRSA infections. RESULT CALLED TO, READ BACK BY AND VERIFIED WITH: F. HASZ RN 15:30 07/07/15 ([email or web address])   Anaerobic culture     Status: None (Preliminary result)   Collection Time: 07/07/15  5:57 PM  Result Value Ref Range Status   Specimen Description CSF  Final   Special Requests NONE  Final   Gram  Stain   Final    CYTOSPIN SMEAR WBC PRESENT, PREDOMINANTLY MONONUCLEAR NO ORGANISMS SEEN    Culture   Final    NO ANAEROBES ISOLATED; CULTURE IN PROGRESS FOR 5 DAYS   Report Status PENDING  Incomplete  CSF culture     Status: None (Preliminary result)   Collection Time: 07/07/15  5:57 PM  Result Value Ref Range Status   Specimen Description CSF  Final   Special Requests NONE  Final   Gram Stain   Final    WBC PRESENT, PREDOMINANTLY MONONUCLEAR NO ORGANISMS SEEN CYTOSPIN    Culture NO GROWTH < 24 HOURS  Final   Report Status PENDING  Incomplete  Culture, blood (routine x 2)     Status: None (Preliminary result)   Collection Time: 07/07/15  6:55 PM  Result Value Ref Range Status   Specimen Description BLOOD RIGHT FOREARM  Final   Special Requests BOTTLES DRAWN AEROBIC ONLY [place]  Final   Culture NO GROWTH < 24 HOURS  Final   Report Status PENDING  Incomplete  Culture, blood (Routine X 2) w Reflex to ID Panel     Status: None (Preliminary result)   Collection Time: 07/07/15  9:25 PM  Result Value Ref Range Status   Specimen Description BLOOD RIGHT HAND  Final   Special Requests   Final    BOTTLES DRAWN AEROBIC AND ANAEROBIC BLUE 10CC RED [place]   Culture NO GROWTH < 24 HOURS  Final   Report Status PENDING  Incomplete    Studies/Results: Dg Chest Port 1 View  07/06/2015  CLINICAL DATA:  Dyspnea and shortness of breath for 1 day. On dialysis. EXAM: PORTABLE CHEST 1 VIEW COMPARISON:  02/27/2015 FINDINGS: Heart size remains at the upper limits of normal and stable. Right jugular dialysis catheter remains in appropriate position. Previously seen right upper lobe airspace disease has resolved since prior exam. Both lungs are clear. No evidence pneumothorax or pleural effusion. IMPRESSION: No active disease. Electronically Signed   By: Earle Gell M.D.   On: 07/06/2015 20:55   Dg Fluoro Guide Lumbar Puncture  07/07/2015  CLINICAL DATA:  HIV with fever, headache and blurred vision. EXAM:  DIAGNOSTIC LUMBAR PUNCTURE UNDER FLUOROSCOPIC GUIDANCE FLUOROSCOPY TIME:  Radiation Exposure Index (as provided by the fluoroscopic device): 4.6 mGy If the device does not provide the exposure index: Fluoroscopy Time (in minutes and seconds):  0 minutes and 56 seconds Number of  Acquired Images: PROCEDURE: Informed consent was obtained from the patient prior to the procedure, including potential complications of headache, allergy, and pain. With the patient prone, the lower back was prepped with Betadine. 1% Lidocaine was used for local anesthesia. Lumbar puncture was performed at the L4-5 level using a 20 gauge needle with return of clear CSF. 10.5 ml of CSF were obtained for laboratory studies. The patient tolerated the procedure well and there were no apparent complications. IMPRESSION: Fluoroscopic guided lumbar puncture yielding 10.5 cc clear CSF for appropriate laboratory evaluation. Electronically Signed   By: Marijo Sanes M.D.   On: 07/07/2015 17:26     Assessment/Plan: AIDS (prev DRVc/RPV/DTGV) ESRD Meningitis (7477 WBC, 75%N) Headache N/v Worsened vision Thrombocytopenia  Total days of antibiotics: 2 vanco/merrem  Await CSF Cx and PCR studies.  Would restart his ART No change in atbx          Bobby Rumpf Infectious Diseases (pager) [phone] [email or web address] 07/08/2015, 4:00 PM  LOS: 2 days

## 2015-07-08 NOTE — Progress Notes (Addendum)
Pharmacy Antibiotic Note  Jim Wyatt is a 35 y.o. male with HIV, ESRD admitted on 07/06/2015 with headache/blurry vision, concern for an opportunistic infection such as toxoplasmosis vs. Cryptococcal meningitis vs. CMV. Today 07/08/15 the pharmacy has been consulted for Vancomycin and Meropenem dosing for possible meningitis.  Neprhology folowing:  ESRD - HD TTS .Noting that the patient went to outpt HD yesterday 5/10 ([facility]) after missing txs on 5/06 and 5/04 With HO multple Missed txs over past month.  Nephrology plans for HD tomorrow 5/12.   Plan: Vancomycin 1500 mg IV x1 then plan 750 mg IV after each dialys (HD on Friday 5/12). Plan vancomycin  750mg  IV x1 after HD tomorrow . Will f/u tomorrow and order further HD doses based on HD schedule.  Meropenem 500 mg IV x1 today then 500 mg IV q24h (post HD on HD days) Monitor clinical status, renal function, culture results Vancomycin random pre HD level at steady state. (goal level = 15-25 mcg/ml  pre HD).   Height: 5\' 7"  (170.2 cm) Weight: 150 lb 4.8 oz (68.176 kg) IBW/kg (Calculated) : 66.1  Temp (24hrs), Avg:98.1 F (36.7 C), Min:97.7 F (36.5 C), Max:98.4 F (36.9 C)   Recent Labs Lab 07/06/15 1100 07/07/15 0250 07/07/15 2125 07/08/15 0516  WBC 4.4 2.9* 4.0 3.9*  CREATININE 8.99* 11.46*  --  12.74*    Estimated Creatinine Clearance: 7.8 mL/min (by C-G formula based on Cr of 12.74).    Allergies  Allergen Reactions  . Bactrim [Sulfamethoxazole-Trimethoprim] Other (See Comments)    Renal failure  . Cauliflower [Brassica Oleracea Italica] Anaphylaxis and Other (See Comments)    Pt states his throat closes up  . Vancomycin Other (See Comments)    Patient states vancomycin caused kidney injury  . Amlodipine Palpitations    fatigue  . Augmentin [Amoxicillin-Pot Clavulanate] Other (See Comments)    unknown  . Banana Other (See Comments)  . Fish Allergy Hives  . Onion Rash    Antimicrobials this  admission: Vancomycin 5/11 >> Meropenem 5/11 >>   Dose adjustments this admission:   Microbiology results: 5/10 BCx: pending 5/10 CSF: pending 5/10  MRSA PCR:  positive  Thank you for allowing pharmacy to be a part of this patient's care.  Nicole Cella, RPh Clinical Pharmacist Pager: [phone] 07/08/2015 11:42 AM

## 2015-07-08 NOTE — Progress Notes (Signed)
Subjective: Jim Wyatt was seen and examined on morning rounds.  He was talkative and sitting up at beginning of interview but became more withdrawn over course of conversation.  States headache and blurry vision have not changed - continue to be constant.  He says Dilaudid helps decrease headache pain for a couple of hours but then it gets worse again.  Also endorses nausea and one episode of vomiting yesterday, although he has only taken anti-emetics once during hospitalization.  He has been able to keep down his oral medications.  Overall, patient seems withdrawn and does not like interacting with the team.  He does not want to go to dialysis.  He had an LP done yesterday following platelet transfusion that he said didn't cause much pain.  Blood pressure continues to be elevated, with systolics in the [id number].  He is on Carvedilol, hydralazine, and labetalol. He says most recent NSAID use was twice a day Ibuprofen from Thursday to Monday last week.  He normally doesn't take NSAIDs.  When asked about his Augmentin allergy, he says he was told he had the allergy as a child and is unsure what the reaction was.     Objective: Vital signs in last 24 hours: Filed Vitals:   07/08/15 0443 07/08/15 0657 07/08/15 0723 07/08/15 0842  BP: 166/108 161/110 157/95 162/100  Pulse: 81  85 92  Temp:    98.2 F (36.8 C)  TempSrc:    Oral  Resp:    16  Height:      Weight:      SpO2:    100%   Weight change:   Intake/Output Summary (Last 24 hours) at 07/08/15 1057 Last data filed at 07/08/15 0538  Gross per 24 hour  Intake   1178 ml  Output      0 ml  Net   1178 ml   General: Cooperative, but not very interactive Lungs: CTAB Heart: RRR, 2+ radial pulses Abdomen: Soft, nondistended.  No TTP Extremities: Warm, dry  Lab Results: CBC    Component Value Date/Time   WBC 3.9* 07/08/2015 0516   RBC 3.53* 07/08/2015 0516   RBC <1.00* 01/12/2015 1617   HGB 9.7* 07/08/2015 0516   HCT 29.7* 07/08/2015  0516   PLT 71* 07/08/2015 0516   MCV 84.1 07/08/2015 0516   MCH 27.5 07/08/2015 0516   MCHC 32.7 07/08/2015 0516   RDW 16.6* 07/08/2015 0516   LYMPHSABS 0.4* 07/07/2015 0250   MONOABS 0.5 07/07/2015 0250   EOSABS 0.3 07/07/2015 0250   BASOSABS 0.0 07/07/2015 0250    CMP Latest Ref Rng 07/08/2015 07/07/2015 07/06/2015  Glucose 65 - 99 mg/dL 97 79 95  BUN 6 - 20 mg/dL 60(H) 54(H) 42(H)  Creatinine 0.61 - 1.24 mg/dL 12.74(H) 11.46(H) 8.99(H)  Sodium 135 - 145 mmol/L 140 139 137  Potassium 3.5 - 5.1 mmol/L 4.1 4.3 3.9  Chloride 101 - 111 mmol/L 102 101 99(L)  CO2 22 - 32 mmol/L 22 25 27   Calcium 8.9 - 10.3 mg/dL 9.2 8.8(L) 8.4(L)  Total Protein 6.5 - 8.1 g/dL - - 8.5(H)  Total Bilirubin 0.3 - 1.2 mg/dL - - 1.5(H)  Alkaline Phos 38 - 126 U/L - - 41  AST 15 - 41 U/L - - 11(L)  ALT 17 - 63 U/L - - 10(L)   CSF cultures pending: Cyrptococcal Ag, JC virus, HSV, CMV, VDRL CSF fluid: clear, 5 RBC per field, Protein 81 (high), glucose 51 (normal).   No organisms.  WBCs.  Cultures pending: anaerobes, acid fast, fungi   Micro Results: Recent Results (from the past 240 hour(s))  MRSA PCR Screening     Status: Abnormal   Collection Time: 07/07/15 12:59 PM  Result Value Ref Range Status   MRSA by PCR POSITIVE (A) NEGATIVE Final    Comment:        The GeneXpert MRSA Assay (FDA approved for NASAL specimens only), is one component of a comprehensive MRSA colonization surveillance program. It is not intended to diagnose MRSA infection nor to guide or monitor treatment for MRSA infections. RESULT CALLED TO, READ BACK BY AND VERIFIED WITH: F. HASZ RN 15:30 07/07/15 ([email or web address])   CSF culture     Status: None (Preliminary result)   Collection Time: 07/07/15  5:57 PM  Result Value Ref Range Status   Specimen Description CSF  Final   Special Requests NONE  Final   Gram Stain   Final    WBC PRESENT, PREDOMINANTLY MONONUCLEAR NO ORGANISMS SEEN CYTOSPIN    Culture PENDING  Incomplete   Report  Status PENDING  Incomplete  Culture, blood (routine x 2)     Status: None (Preliminary result)   Collection Time: 07/07/15  6:55 PM  Result Value Ref Range Status   Specimen Description BLOOD RIGHT FOREARM  Final   Special Requests BOTTLES DRAWN AEROBIC ONLY [place]  Final   Culture PENDING  Incomplete   Report Status PENDING  Incomplete  Culture, blood (Routine X 2) w Reflex to ID Panel     Status: None (Preliminary result)   Collection Time: 07/07/15  9:25 PM  Result Value Ref Range Status   Specimen Description BLOOD RIGHT HAND  Final   Special Requests   Final    BOTTLES DRAWN AEROBIC AND ANAEROBIC BLUE 10CC RED [place]   Culture PENDING  Incomplete   Report Status PENDING  Incomplete   Studies/Results: Ct Head Wo Contrast  07/06/2015  CLINICAL DATA:  Headache since last Tuesday. Pt also hypertensive Hx: HTN, Dialysis, HIV infection EXAM: CT HEAD WITHOUT CONTRAST TECHNIQUE: Contiguous axial images were obtained from the base of the skull through the vertex without intravenous contrast. COMPARISON:  04/10/2008 FINDINGS: Brain: No evidence of acute infarction, hemorrhage, extra-axial collection, ventriculomegaly, or mass effect. Vascular: No hyperdense vessel or unexpected calcification. Skull: Negative for fracture or focal lesion. Sinuses/Orbits: Mild mucoperiosteal thickening in visualized maxillary sinuses with changes of previous right maxillary antrectomy. Other: None. IMPRESSION: 1. Negative for bleed or other acute intracranial process. Electronically Signed   By: Lucrezia Europe M.D.   On: 07/06/2015 12:59   Dg Chest Port 1 View  07/06/2015  CLINICAL DATA:  Dyspnea and shortness of breath for 1 day. On dialysis. EXAM: PORTABLE CHEST 1 VIEW COMPARISON:  02/27/2015 FINDINGS: Heart size remains at the upper limits of normal and stable. Right jugular dialysis catheter remains in appropriate position. Previously seen right upper lobe airspace disease has resolved since prior exam. Both lungs are clear.  No evidence pneumothorax or pleural effusion. IMPRESSION: No active disease. Electronically Signed   By: Earle Gell M.D.   On: 07/06/2015 20:55   Dg Fluoro Guide Lumbar Puncture  07/07/2015  CLINICAL DATA:  HIV with fever, headache and blurred vision. EXAM: DIAGNOSTIC LUMBAR PUNCTURE UNDER FLUOROSCOPIC GUIDANCE FLUOROSCOPY TIME:  Radiation Exposure Index (as provided by the fluoroscopic device): 4.6 mGy If the device does not provide the exposure index: Fluoroscopy Time (in minutes and seconds):  0 minutes and 56 seconds Number of Acquired Images: PROCEDURE: Informed  consent was obtained from the patient prior to the procedure, including potential complications of headache, allergy, and pain. With the patient prone, the lower back was prepped with Betadine. 1% Lidocaine was used for local anesthesia. Lumbar puncture was performed at the L4-5 level using a 20 gauge needle with return of clear CSF. 10.5 ml of CSF were obtained for laboratory studies. The patient tolerated the procedure well and there were no apparent complications. IMPRESSION: Fluoroscopic guided lumbar puncture yielding 10.5 cc clear CSF for appropriate laboratory evaluation. Electronically Signed   By: Marijo Sanes M.D.   On: 07/07/2015 17:26   Medications: I have reviewed the patient's current medications. Scheduled Meds: . calcitRIOL  0.25 mcg Oral Q T,Th,Sa-HD  . calcium carbonate  200 mg of elemental calcium Oral TID WC  . carvedilol  12.5 mg Oral BID WC  . hydrALAZINE  25 mg Oral Q6H  .  HYDROmorphone (DILAUDID) injection  0.5 mg Intravenous Once  . multivitamin  1 tablet Oral QHS  . 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     Headache/blurry vision Continues to endorse headache and blurry vision.  Headache only temporarily quelled with Dilaudid.  Remains hypertensive, labetalol added to regimen last night.  LP lab results pending.  MRI scheduled for this morning - will receive pain meds before imaging.  ID recommends beginning abx treatment for asymptomatic meningitis - Vancomycin and Miripenem in setting of Augmentin allergy.  Still working up cause of headache: uremia vs. HTN vs. Opportunistic infection    -ID following, appreciate recs -Per ID recommendation, will start on Vancomycin and Miripenem for possible aseptic meningitis.  Vancomycin okay despite allergy d/t already terrible renal function - will not cause more harm to kidneys.  Using Miripenem instead of ceftriaxone/ampcillin per pharmacy because better for patients on dialysis and avoids ampcillin given bactrim allergy.  -MRI scan of brain today with adequate pain meds ahead of time  -Dilaudid PRN q6 for headache  -Tylenol 650 mg PRN for headache  -Up Hydralazine to 25mg  q6 -Coreg 12.5mg  bid   -Labetalol 10mg  PRN for HTN  -CSF results pending    Nausea/vomiting One episode of vomiting yesterday.  Continues to be nauseous.   -Clear liquids as tolerated -Compazine PRN   HTN Remains hypertensive (systolics in [id number]) despite Coreg 12.5 mg bid, hydralazine 5mg  and labetalol 10mg .  Make changes as outline above:  -Up hydralazine to 25mg  q6  -Continue Coreg 12.5 mg bid -Labetalol 10mg  PRN    CKD T/Th/Sun dialysis.  Does not want to go to dialysis today.  Nephro recommends dialysis tomorrow  -Nephrology following, appreciate recs  HIV  Has not been on HAART since March. CD4 count of 10.  -do NOT restart HAART at this time  This is a Careers information officer Note.  The care of the patient was discussed with Dr. Jule Ser and the assessment and plan formulated with their assistance.  Please see their attached note for official  documentation of the daily encounter.   LOS: 2 days   Jim Wyatt, Med Student 07/08/2015, 10:57 AM

## 2015-07-08 NOTE — Progress Notes (Signed)
Subjective: Patient seen and examined this morning on rounds.  Still complaining of headache and nods head yes to whether having blurry vision.  Underwent LP yesterday with no difficulty.  Refused MRI this morning, reminded him that he has a 1 time pain medication dose to get prior to MRI.  No fever or chills.  Objective: Vital signs in last 24 hours: Filed Vitals:   07/08/15 0443 07/08/15 0657 07/08/15 0723 07/08/15 0842  BP: 166/108 161/110 157/95 162/100  Pulse: 81  85 92  Temp:    98.2 F (36.8 C)  TempSrc:    Oral  Resp:    16  Height:      Weight:      SpO2:    100%   Weight change:   Intake/Output Summary (Last 24 hours) at 07/08/15 1102 Last data filed at 07/08/15 0538  Gross per 24 hour  Intake   1178 ml  Output      0 ml  Net   1178 ml   General: resting in bed, not distressed, withdrawn, soft spoken HEENT: EOMI, no scleral icterus Cardiac: RRR, no rubs, murmurs or gallops Pulm: clear to auscultation bilaterally, moving normal volumes of air Abd: soft, nontender, nondistended, BS present Ext: warm and well perfused, no pedal edema Neuro: alert and oriented X3, cranial nerves II-XII grossly intact  Lab Results: Basic Metabolic Panel:  Recent Labs Lab 07/07/15 0250 07/08/15 0516  NA 139 140  K 4.3 4.1  CL 101 102  CO2 25 22  GLUCOSE 79 97  BUN 54* 60*  CREATININE 11.46* 12.74*  CALCIUM 8.8* 9.2  PHOS 6.7* 6.6*   Liver Function Tests:  Recent Labs Lab 07/06/15 1100 07/07/15 0250 07/08/15 0516  AST 11*  --   --   ALT 10*  --   --   ALKPHOS 41  --   --   BILITOT 1.5*  --   --   PROT 8.5*  --   --   ALBUMIN 3.1* 3.0* 3.1*    Recent Labs Lab 07/06/15 1100  LIPASE 33   CBC:  Recent Labs Lab 07/07/15 0250 07/07/15 2125 07/08/15 0516  WBC 2.9* 4.0 3.9*  NEUTROABS 1.7  --   --   HGB 9.7* 10.1* 9.7*  HCT 30.1* 30.6* 29.7*  MCV 83.6 84.1 84.1  PLT 54* 76* 71*   Cardiac Enzymes:  Recent Labs Lab 07/06/15 1425 07/06/15 2210  07/07/15 0250  TROPONINI 0.04* 0.04* 0.04*   Coagulation:  Recent Labs Lab 07/07/15 1201  LABPROT 14.4  INR 1.10   Urine Drug Screen: Drugs of Abuse     Component Value Date/Time   LABOPIA POSITIVE* 07/07/2015 0759   COCAINSCRNUR NONE DETECTED 07/07/2015 0759   LABBENZ NONE DETECTED 07/07/2015 0759   AMPHETMU NONE DETECTED 07/07/2015 0759   THCU NONE DETECTED 07/07/2015 0759   LABBARB NONE DETECTED 07/07/2015 0759     Micro Results: Recent Results (from the past 240 hour(s))  MRSA PCR Screening     Status: Abnormal   Collection Time: 07/07/15 12:59 PM  Result Value Ref Range Status   MRSA by PCR POSITIVE (A) NEGATIVE Final    Comment:        The GeneXpert MRSA Assay (FDA approved for NASAL specimens only), is one component of a comprehensive MRSA colonization surveillance program. It is not intended to diagnose MRSA infection nor to guide or monitor treatment for MRSA infections. RESULT CALLED TO, READ BACK BY AND VERIFIED WITH: F. HASZ RN  15:30 07/07/15 ([email or web address])   CSF culture     Status: None (Preliminary result)   Collection Time: 07/07/15  5:57 PM  Result Value Ref Range Status   Specimen Description CSF  Final   Special Requests NONE  Final   Gram Stain   Final    WBC PRESENT, PREDOMINANTLY MONONUCLEAR NO ORGANISMS SEEN CYTOSPIN    Culture PENDING  Incomplete   Report Status PENDING  Incomplete  Culture, blood (routine x 2)     Status: None (Preliminary result)   Collection Time: 07/07/15  6:55 PM  Result Value Ref Range Status   Specimen Description BLOOD RIGHT FOREARM  Final   Special Requests BOTTLES DRAWN AEROBIC ONLY [place]  Final   Culture PENDING  Incomplete   Report Status PENDING  Incomplete  Culture, blood (Routine X 2) w Reflex to ID Panel     Status: None (Preliminary result)   Collection Time: 07/07/15  9:25 PM  Result Value Ref Range Status   Specimen Description BLOOD RIGHT HAND  Final   Special Requests   Final    BOTTLES DRAWN  AEROBIC AND ANAEROBIC BLUE 10CC RED [place]   Culture PENDING  Incomplete   Report Status PENDING  Incomplete   Studies/Results: Ct Head Wo Contrast  07/06/2015  CLINICAL DATA:  Headache since last Tuesday. Pt also hypertensive Hx: HTN, Dialysis, HIV infection EXAM: CT HEAD WITHOUT CONTRAST TECHNIQUE: Contiguous axial images were obtained from the base of the skull through the vertex without intravenous contrast. COMPARISON:  04/10/2008 FINDINGS: Brain: No evidence of acute infarction, hemorrhage, extra-axial collection, ventriculomegaly, or mass effect. Vascular: No hyperdense vessel or unexpected calcification. Skull: Negative for fracture or focal lesion. Sinuses/Orbits: Mild mucoperiosteal thickening in visualized maxillary sinuses with changes of previous right maxillary antrectomy. Other: None. IMPRESSION: 1. Negative for bleed or other acute intracranial process. Electronically Signed   By: Lucrezia Europe M.D.   On: 07/06/2015 12:59   Dg Chest Port 1 View  07/06/2015  CLINICAL DATA:  Dyspnea and shortness of breath for 1 day. On dialysis. EXAM: PORTABLE CHEST 1 VIEW COMPARISON:  02/27/2015 FINDINGS: Heart size remains at the upper limits of normal and stable. Right jugular dialysis catheter remains in appropriate position. Previously seen right upper lobe airspace disease has resolved since prior exam. Both lungs are clear. No evidence pneumothorax or pleural effusion. IMPRESSION: No active disease. Electronically Signed   By: Earle Gell M.D.   On: 07/06/2015 20:55   Dg Fluoro Guide Lumbar Puncture  07/07/2015  CLINICAL DATA:  HIV with fever, headache and blurred vision. EXAM: DIAGNOSTIC LUMBAR PUNCTURE UNDER FLUOROSCOPIC GUIDANCE FLUOROSCOPY TIME:  Radiation Exposure Index (as provided by the fluoroscopic device): 4.6 mGy If the device does not provide the exposure index: Fluoroscopy Time (in minutes and seconds):  0 minutes and 56 seconds Number of Acquired Images: PROCEDURE: Informed consent was  obtained from the patient prior to the procedure, including potential complications of headache, allergy, and pain. With the patient prone, the lower back was prepped with Betadine. 1% Lidocaine was used for local anesthesia. Lumbar puncture was performed at the L4-5 level using a 20 gauge needle with return of clear CSF. 10.5 ml of CSF were obtained for laboratory studies. The patient tolerated the procedure well and there were no apparent complications. IMPRESSION: Fluoroscopic guided lumbar puncture yielding 10.5 cc clear CSF for appropriate laboratory evaluation. Electronically Signed   By: Marijo Sanes M.D.   On: 07/07/2015 17:26   Medications: I have reviewed  the patient's current medications. Scheduled Meds: . calcitRIOL  0.25 mcg Oral Q T,Th,Sa-HD  . calcium carbonate  200 mg of elemental calcium Oral TID WC  . carvedilol  12.5 mg Oral BID WC  . hydrALAZINE  25 mg Oral Q6H  .  HYDROmorphone (DILAUDID) injection  0.5 mg Intravenous Once  . multivitamin  1 tablet Oral QHS  . 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  May very well be driven by his blood pressure and non-adherence to HD.  He has remained hypertensive overnight and PRN Labetalol was added.  However, given his profound immune compromise, infection still remains very much a concern. - appreciate  infectious disease for following and their recommendations - await MRI, hopefully today - continue dilaudid prn for headache, add tylenol prn - LP done yesterday with glucose 52, protein 81, WBC 2, RBC 5 not indicative of bacterial meningitis.  Due to his low CD4 and potentially being unable to mount an inflammatory response, this may be an atypical presentation.  Spoke with our pharmacist who recommended Vanc and Meropenem in place of Vanc, Ceftriaxone, Ampicillin due to his allergies - continuing blood pressure control as below - discussed with ophthamology who would like MRI results first - await further LP culture data  Nausea & Vomiting: seems to be improving, tolerating clear liquid diet - clear liquids as tolerated - pherergan or compazine q6h prn  HTN: patient on Carvedilol 12.5mg  BID at home but has not taken for past week prior to admission - continue carvedilol - increase hydralazine to 25mg  PO Q6H - labetalol 10mg  IV Q2H prn for SBP > 160 or DBP >95  HIV Disease: patient has not been taking ART since March and has been non-adherent in the past. Supposed to be on rilpivirine, Tivicay, and Prezcobix. He also has not been taking his Dapsone for OI prophylaxis. - holding HIV meds as he is not taking at home - CD4 10 - VL 86,000  ESRD: he is on Tues, Thurs, Sat dialysis. He has been non-adherent recently with his HD so symptoms may be in part due to uremia as well - nephrology consult - plan for HD tomorrow AM, but patient informed nursing this morning that he does not want to have dialysis.   - discussed with patient the necessity for dialysis and he says he will think about it  Normocytic Anemia: Likely 2/2 to chronic disease - follow for now  Thrombocytopenia: chronic, no signs of obvious bleeding. Likely 2/2 to chronic disease state - follow for now  Diet: Clear Liquid  Code: Full  Dispo: Disposition is deferred at this time, awaiting improvement of current medical  problems.  Anticipated discharge in approximately 2-3 day(s).   The patient does have a current PCP ([name], MD) and does not need an [facility] hospital follow-up appointment after discharge.  The patient does not have transportation limitations that hinder transportation to clinic appointments.  .Services Needed at time  of discharge: Y = Yes, Blank = No PT:   OT:   RN:   Equipment:   Other:     LOS: 2 days   Jule Ser, DO 07/08/2015, 11:02 AM

## 2015-07-08 NOTE — Progress Notes (Signed)
[facility] ROUNDING NOTE   Subjective:   Interval History:  No complaints no issues this am resting in bed  Objective:  Vital signs in last 24 hours:  Temp:  [97.7 F (36.5 C)-98.4 F (36.9 C)] 98.2 F (36.8 C) (05/11 0842) Pulse Rate:  [81-98] 92 (05/11 0842) Resp:  [11-18] 16 (05/11 0842) BP: (153-180)/(95-116) 162/100 mmHg (05/11 0842) SpO2:  [98 %-100 %] 100 % (05/11 0842)  Weight change:  Filed Weights   07/06/15 1040 07/06/15 2157 07/07/15 0455  Weight: 68.04 kg (150 lb) 68.357 kg (150 lb 11.2 oz) 68.176 kg (150 lb 4.8 oz)    Intake/Output: I/O last 3 completed shifts: In: 1181 [P.O.:480; I.V.:506; Blood:195] Out: -    Intake/Output this shift:     General: alert thin AA M Chronically ill appearing NAD HEENT: EOMI, no icterus, MM dry  Cardiac: RRR, no rubs, murmurs or gallops Pulm: CTA Bilaterally,nonlabored breathjing Abd: soft, nondistended, BS + normal, mild diffuse tenderness Extremities: trace pedal edema. Left AV fistula with thrill, mild swelling of left forearm Neuro: alert/ oriented X3, Moves all exterm. No gross focal deficits Skin: warm, dry, no overt rash, no pedal ulcera Dialysis Access L FA AVF pos bruit And swelling sec Infiltration appearance / R IL perm    Dialysis Orders: [facility] . EDW = 71.0 ( yesrt pre 69.9) HD Bath 2k,2ca Time 3hr 56min Heparin 5000. Access LFA AVF and R IJ perm cath  Clacitriol 0.25mcg po /HD     Basic Metabolic Panel:  Recent Labs Lab 07/06/15 1100 07/07/15 0250 07/08/15 0516  NA 137 139 140  K 3.9 4.3 4.1  CL 99* 101 102  CO2 27 25 22   GLUCOSE 95 79 97  BUN 42* 54* 60*  CREATININE 8.99* 11.46* 12.74*  CALCIUM 8.4* 8.8* 9.2  PHOS  --  6.7* 6.6*    Liver Function Tests:  Recent Labs Lab 07/06/15 1100 07/07/15 0250 07/08/15 0516  AST 11*  --   --   ALT 10*  --   --   ALKPHOS 41  --   --   BILITOT 1.5*  --   --   PROT 8.5*  --   --   ALBUMIN 3.1* 3.0*  3.1*    Recent Labs Lab 07/06/15 1100  LIPASE 33   No results for input(s): AMMONIA in the last 168 hours.  CBC:  Recent Labs Lab 07/06/15 1100 07/07/15 0250 07/07/15 2125 07/08/15 0516  WBC 4.4 2.9* 4.0 3.9*  NEUTROABS  --  1.7  --   --   HGB 10.5* 9.7* 10.1* 9.7*  HCT 31.6* 30.1* 30.6* 29.7*  MCV 83.6 83.6 84.1 84.1  PLT 48* 54* 76* 71*    Cardiac Enzymes:  Recent Labs Lab 07/06/15 1425 07/06/15 2210 07/07/15 0250  TROPONINI 0.04* 0.04* 0.04*    BNP: Invalid input(s): POCBNP  CBG: No results for input(s): GLUCAP in the last 168 hours.  Microbiology: Results for orders placed or performed during the hospital encounter of 07/06/15  MRSA PCR Screening     Status: Abnormal   Collection Time: 07/07/15 12:59 PM  Result Value Ref Range Status   MRSA by PCR POSITIVE (A) NEGATIVE Final    Comment:        The GeneXpert MRSA Assay (FDA approved for NASAL specimens only), is one component of a comprehensive MRSA colonization surveillance program. It is not intended to diagnose MRSA infection nor to guide or monitor treatment for MRSA infections.  RESULT CALLED TO, READ BACK BY AND VERIFIED WITH: F. HASZ RN 15:30 07/07/15 ([email or web address])   CSF culture     Status: None (Preliminary result)   Collection Time: 07/07/15  5:57 PM  Result Value Ref Range Status   Specimen Description CSF  Final   Special Requests NONE  Final   Gram Stain   Final    WBC PRESENT, PREDOMINANTLY MONONUCLEAR NO ORGANISMS SEEN CYTOSPIN    Culture PENDING  Incomplete   Report Status PENDING  Incomplete  Culture, blood (routine x 2)     Status: None (Preliminary result)   Collection Time: 07/07/15  6:55 PM  Result Value Ref Range Status   Specimen Description BLOOD RIGHT FOREARM  Final   Special Requests BOTTLES DRAWN AEROBIC ONLY [place]  Final   Culture PENDING  Incomplete   Report Status PENDING  Incomplete  Culture, blood (Routine X 2) w Reflex to ID Panel     Status: None (Preliminary  result)   Collection Time: 07/07/15  9:25 PM  Result Value Ref Range Status   Specimen Description BLOOD RIGHT HAND  Final   Special Requests   Final    BOTTLES DRAWN AEROBIC AND ANAEROBIC BLUE 10CC RED [place]   Culture PENDING  Incomplete   Report Status PENDING  Incomplete    Coagulation Studies:  Recent Labs  07/07/15 1201  LABPROT 14.4  INR 1.10    Urinalysis: No results for input(s): COLORURINE, LABSPEC, PHURINE, GLUCOSEU, HGBUR, BILIRUBINUR, KETONESUR, PROTEINUR, UROBILINOGEN, NITRITE, LEUKOCYTESUR in the last 72 hours.  Invalid input(s): APPERANCEUR    Imaging: Ct Head Wo Contrast  07/06/2015  CLINICAL DATA:  Headache since last Tuesday. Pt also hypertensive Hx: HTN, Dialysis, HIV infection EXAM: CT HEAD WITHOUT CONTRAST TECHNIQUE: Contiguous axial images were obtained from the base of the skull through the vertex without intravenous contrast. COMPARISON:  04/10/2008 FINDINGS: Brain: No evidence of acute infarction, hemorrhage, extra-axial collection, ventriculomegaly, or mass effect. Vascular: No hyperdense vessel or unexpected calcification. Skull: Negative for fracture or focal lesion. Sinuses/Orbits: Mild mucoperiosteal thickening in visualized maxillary sinuses with changes of previous right maxillary antrectomy. Other: None. IMPRESSION: 1. Negative for bleed or other acute intracranial process. Electronically Signed   By: Lucrezia Europe M.D.   On: 07/06/2015 12:59   Dg Chest Port 1 View  07/06/2015  CLINICAL DATA:  Dyspnea and shortness of breath for 1 day. On dialysis. EXAM: PORTABLE CHEST 1 VIEW COMPARISON:  02/27/2015 FINDINGS: Heart size remains at the upper limits of normal and stable. Right jugular dialysis catheter remains in appropriate position. Previously seen right upper lobe airspace disease has resolved since prior exam. Both lungs are clear. No evidence pneumothorax or pleural effusion. IMPRESSION: No active disease. Electronically Signed   By: Earle Gell M.D.   On:  07/06/2015 20:55   Dg Fluoro Guide Lumbar Puncture  07/07/2015  CLINICAL DATA:  HIV with fever, headache and blurred vision. EXAM: DIAGNOSTIC LUMBAR PUNCTURE UNDER FLUOROSCOPIC GUIDANCE FLUOROSCOPY TIME:  Radiation Exposure Index (as provided by the fluoroscopic device): 4.6 mGy If the device does not provide the exposure index: Fluoroscopy Time (in minutes and seconds):  0 minutes and 56 seconds Number of Acquired Images: PROCEDURE: Informed consent was obtained from the patient prior to the procedure, including potential complications of headache, allergy, and pain. With the patient prone, the lower back was prepped with Betadine. 1% Lidocaine was used for local anesthesia. Lumbar puncture was performed at the L4-5 level using a 20 gauge needle with return  of clear CSF. 10.5 ml of CSF were obtained for laboratory studies. The patient tolerated the procedure well and there were no apparent complications. IMPRESSION: Fluoroscopic guided lumbar puncture yielding 10.5 cc clear CSF for appropriate laboratory evaluation. Electronically Signed   By: Marijo Sanes M.D.   On: 07/07/2015 17:26     Medications:     . calcitRIOL  0.25 mcg Oral Q T,Th,Sa-HD  . calcium carbonate  200 mg of elemental calcium Oral TID WC  . carvedilol  12.5 mg Oral BID WC  . hydrALAZINE  25 mg Oral Q6H  .  HYDROmorphone (DILAUDID) injection  0.5 mg Intravenous Once  . multivitamin  1 tablet Oral QHS  . sodium chloride flush  3 mL Intravenous Q12H   acetaminophen, HYDROmorphone (DILAUDID) injection, labetalol, promethazine **OR** prochlorperazine **OR** promethazine, sodium chloride flush  Assessment/ Plan:  Jim Wyatt is a 35 y.o. 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 He went [facility] yest ([facility]) after missing txs on 5/06 and 5/04 With HO multple Missed txs over past month. His L FA AVF infiltrated but used his R IJ  Perm cath .Have used his AVF multiple times with plans to remove his perm cath This week as op  1. Headaches/ Blurry Vision/ HTN= per Admit Team wu= element of Uremia and htn playing some role in  2. ESRD - HD TTS use permcath with avf infiltration yest. Ice Pack to swollen AVF  Will  Dialyze tomorrow and rest arm  3. Hypertension/volume - volume ok is below edw/ needs meds ?? Compliance as op  4. Anemia of ESRD - HGB 9.7 no eas at op start Aranesp in hosp/  5. Metabolic bone disease - Po vit d on hd , needs binders when eating 6. Thrombocytopenia= per admit team 7. HIV = per admit, noted he Has not taken ART since March and has been non-adherent in the past   LOS: 2 Annalis Kaczmarczyk W @TODAY @10 :17 AM

## 2015-07-09 DIAGNOSIS — B20 Human immunodeficiency virus [HIV] disease: Secondary | ICD-10-CM | POA: Insufficient documentation

## 2015-07-09 DIAGNOSIS — G039 Meningitis, unspecified: Secondary | ICD-10-CM | POA: Insufficient documentation

## 2015-07-09 LAB — RENAL FUNCTION PANEL
Albumin: 3.1 g/dL — ABNORMAL LOW (ref 3.5–5.0)
Anion gap: 15 (ref 5–15)
BUN: 62 mg/dL — ABNORMAL HIGH (ref 6–20)
CO2: 21 mmol/L — ABNORMAL LOW (ref 22–32)
Calcium: 9.1 mg/dL (ref 8.9–10.3)
Chloride: 103 mmol/L (ref 101–111)
Creatinine, Ser: 13.84 mg/dL — ABNORMAL HIGH (ref 0.61–1.24)
GFR calc Af Amer: 5 mL/min — ABNORMAL LOW (ref >60)
GFR calc non Af Amer: 4 mL/min — ABNORMAL LOW (ref >60)
Glucose, Bld: 84 mg/dL (ref 65–99)
Phosphorus: 5.8 mg/dL — ABNORMAL HIGH (ref 2.5–4.6)
Potassium: 4.1 mmol/L (ref 3.5–5.1)
Sodium: 139 mmol/L (ref 135–145)

## 2015-07-09 LAB — CBC
HCT: 30.8 % — ABNORMAL LOW (ref 39.0–52.0)
Hemoglobin: 10 g/dL — ABNORMAL LOW (ref 13.0–17.0)
MCH: 27.4 pg (ref 26.0–34.0)
MCHC: 32.5 g/dL (ref 30.0–36.0)
MCV: 84.4 fL (ref 78.0–100.0)
Platelets: 69 10*3/uL — ABNORMAL LOW (ref 150–400)
RBC: 3.65 MIL/uL — ABNORMAL LOW (ref 4.22–5.81)
RDW: 16.7 % — ABNORMAL HIGH (ref 11.5–15.5)
WBC: 3.9 10*3/uL — ABNORMAL LOW (ref 4.0–10.5)

## 2015-07-09 LAB — HERPES SIMPLEX VIRUS(HSV) DNA BY PCR
HSV 1 DNA: NEGATIVE
HSV 2 DNA: NEGATIVE

## 2015-07-09 MED ORDER — DARUNAVIR-COBICISTAT 800-150 MG PO TABS: 1.0000 | ORAL_TABLET | Freq: Every day | ORAL | Status: DC

## 2015-07-09 MED ORDER — CHLORHEXIDINE GLUCONATE CLOTH 2 % EX PADS
6.0000 | MEDICATED_PAD | Freq: Every day | CUTANEOUS | Status: DC
  Administered 2015-07-10: 6 via TOPICAL

## 2015-07-09 MED ORDER — ACETAMINOPHEN 325 MG PO TABS
ORAL_TABLET | ORAL | Status: AC
  Filled 2015-07-09: qty 2

## 2015-07-09 MED ORDER — PROCHLORPERAZINE EDISYLATE 5 MG/ML IJ SOLN
5.0000 mg | Freq: Four times a day (QID) | INTRAMUSCULAR | Status: DC
  Administered 2015-07-09 – 2015-07-10 (×3): 5 mg via INTRAVENOUS
  Filled 2015-07-09: qty 2
  Filled 2015-07-09 (×4): qty 1
  Filled 2015-07-09 (×2): qty 2

## 2015-07-09 MED ORDER — MUPIROCIN 2 % EX OINT
1.0000 "application " | TOPICAL_OINTMENT | Freq: Two times a day (BID) | CUTANEOUS | Status: DC
  Administered 2015-07-10 (×2): 1 via NASAL
  Filled 2015-07-09: qty 22

## 2015-07-09 MED ORDER — HYDROMORPHONE HCL 1 MG/ML IJ SOLN
INTRAMUSCULAR | Status: AC
  Filled 2015-07-09: qty 1

## 2015-07-09 MED ORDER — DILTIAZEM HCL ER COATED BEADS 120 MG PO CP24
120.0000 mg | ORAL_CAPSULE | Freq: Every day | ORAL | Status: DC
  Administered 2015-07-09 – 2015-07-10 (×2): 120 mg via ORAL
  Filled 2015-07-09 (×3): qty 1

## 2015-07-09 MED ORDER — PROMETHAZINE HCL 25 MG PO TABS
12.5000 mg | ORAL_TABLET | Freq: Four times a day (QID) | ORAL | Status: DC
  Administered 2015-07-10 (×2): 12.5 mg via ORAL
  Filled 2015-07-09 (×3): qty 1

## 2015-07-09 MED ORDER — PROMETHAZINE HCL 25 MG RE SUPP
12.5000 mg | Freq: Four times a day (QID) | RECTAL | Status: DC
  Filled 2015-07-09 (×4): qty 1

## 2015-07-09 MED ORDER — VANCOMYCIN HCL IN DEXTROSE 750-5 MG/150ML-% IV SOLN
INTRAVENOUS | Status: AC
  Filled 2015-07-09: qty 150

## 2015-07-09 MED ORDER — POLYVINYL ALCOHOL 1.4 % OP SOLN
1.0000 [drp] | OPHTHALMIC | Status: DC | PRN
  Administered 2015-07-10: 1 [drp] via OPHTHALMIC
  Filled 2015-07-09: qty 15

## 2015-07-09 MED ORDER — ALBUTEROL SULFATE (2.5 MG/3ML) 0.083% IN NEBU
2.5000 mg | INHALATION_SOLUTION | Freq: Four times a day (QID) | RESPIRATORY_TRACT | Status: DC | PRN
  Administered 2015-07-09: 2.5 mg via RESPIRATORY_TRACT
  Filled 2015-07-09: qty 3

## 2015-07-09 MED ORDER — POLYETHYLENE GLYCOL 3350 17 G PO PACK
17.0000 g | PACK | Freq: Every day | ORAL | Status: DC
  Administered 2015-07-10: 17 g via ORAL
  Filled 2015-07-09 (×2): qty 1

## 2015-07-09 MED ORDER — HYDROMORPHONE HCL 1 MG/ML IJ SOLN
0.5000 mg | INTRAMUSCULAR | Status: DC | PRN
  Administered 2015-07-09 (×3): 0.5 mg via INTRAVENOUS
  Filled 2015-07-09: qty 1

## 2015-07-09 MED ORDER — LORAZEPAM 1 MG PO TABS
1.0000 mg | ORAL_TABLET | Freq: Once | ORAL | Status: AC
  Administered 2015-07-09: 1 mg via ORAL
  Filled 2015-07-09: qty 1

## 2015-07-09 NOTE — Procedures (Signed)
I have seen and examined this patient and agree with the plan of care   Seen on dialysis   BP 150/70 goal 500 cc  Hb 10   Ca  9.1  Phos 5.8   Alb 3.1   Alder Murri W 07/09/2015, 9:14 AM

## 2015-07-09 NOTE — Progress Notes (Signed)
INFECTIOUS DISEASE PROGRESS NOTE  ID: Jim Wyatt is a 35 y.o. 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  Can't keep ART down.  No BM in 2 days  Abtx:  Anti-infectives    Start     Dose/Rate Route Frequency Ordered Stop   07/10/15 0800  darunavir-cobicistat (PREZCOBIX) 800-150 MG per tablet 1 tablet     1 tablet Oral Daily with breakfast 07/09/15 1337     07/09/15 1400  meropenem (MERREM) 500 mg in sodium chloride 0.9 % 50 mL IVPB     500 mg 100 mL/hr over 30 Minutes Intravenous Every 24 hours 07/08/15 1203     07/09/15 1400  vancomycin (VANCOCIN) IVPB 750 mg/150 ml premix     750 mg 150 mL/hr over 60 Minutes Intravenous Every Dialysis 07/08/15 1206     07/09/15 0934  Vancomycin (VANCOCIN) 750-5 MG/150ML-% IVPB    Comments:  [place], Zenaida   : cabinet override      07/09/15 0934 07/09/15 1005   07/09/15 0800  rilpivirine (EDURANT) tablet 25 mg     25 mg Oral Daily with breakfast 07/08/15 1817     07/08/15 2000  dolutegravir (TIVICAY) tablet 50 mg     50 mg Oral Daily 07/08/15 1817     07/08/15 2000  darunavir-cobicistat (PREZCOBIX) 800-150 MG per tablet 1 tablet  Status:  Discontinued     1 tablet Oral Daily 07/08/15 1817 07/09/15 1337   07/08/15 1545  vancomycin (VANCOCIN) 1,500 mg in sodium chloride 0.9 % 500 mL IVPB     1,500 mg 250 mL/hr over 120 Minutes Intravenous  Once 07/08/15 1536 07/08/15 1834   07/08/15 1315  vancomycin (VANCOCIN) 1,500 mg in sodium chloride 0.9 % 500 mL IVPB  Status:  Discontinued     1,500 mg 250 mL/hr over 120  Minutes Intravenous  Once 07/08/15 1203 07/08/15 1536   07/08/15 1245  meropenem (MERREM) 500 mg in sodium chloride 0.9 % 50 mL IVPB     500 mg 100 mL/hr over 30 Minutes Intravenous  Once 07/08/15 1203 07/08/15 1524      Medications:  Scheduled: . calcitRIOL  0.25 mcg Oral Q T,Th,Sa-HD  . calcium carbonate  200 mg of elemental calcium Oral TID WC  . carvedilol  12.5 mg Oral BID WC  . [START ON 07/10/2015] Chlorhexidine Gluconate Cloth  6 each Topical Q0600  . [START ON 07/10/2015] darunavir-cobicistat  1 tablet Oral Q breakfast  . diltiazem  120 mg Oral Daily  . dolutegravir  50 mg Oral Daily  . hydrALAZINE  25 mg Oral Q6H  .  HYDROmorphone (DILAUDID) injection  0.5 mg Intravenous Once  . meropenem (MERREM) IV  500 mg Intravenous Q24H  . multivitamin  1 tablet Oral QHS  . mupirocin ointment  1 application Nasal BID  . promethazine  12.5 mg Oral Q6H   Or  . prochlorperazine  5 mg Intravenous Q6H   Or  . promethazine  12.5 mg Rectal Q6H  . rilpivirine  25 mg Oral Q breakfast  . sodium chloride flush  3 mL Intravenous  Q12H    Objective: Vital signs in last 24 hours: Temp:  [97.9 F (36.6 C)-98.2 F (36.8 C)] 98.2 F (36.8 C) (05/12 1422) Pulse Rate:  [81-94] 94 (05/12 1422) Resp:  [18] 18 (05/12 1422) BP: (152-178)/(85-103) 157/97 mmHg (05/12 1422) SpO2:  [96 %-100 %] 99 % (05/12 1422) Weight:  [67.7 kg (149 lb 4 oz)-71.714 kg (158 lb 1.6 oz)] 67.7 kg (149 lb 4 oz) (05/12 1107)   General appearance: alert, cooperative and mild distress Neck: FROM Resp: clear to auscultation bilaterally Cardio: tachycardia GI: normal findings: bowel sounds normal and soft, non-tender  Lab Results  Recent Labs  07/08/15 0516 07/09/15 0342  WBC 3.9* 3.9*  HGB 9.7* 10.0*  HCT 29.7* 30.8*  NA 140 139  K 4.1 4.1  CL 102 103  CO2 22 21*  BUN 60* 62*  CREATININE 12.74* 13.84*   Liver Panel  Recent Labs  07/08/15 0516 07/09/15 0342  ALBUMIN 3.1* 3.1*   Sedimentation Rate No  results for input(s): ESRSEDRATE in the last 72 hours. C-Reactive Protein No results for input(s): CRP in the last 72 hours.  Microbiology: Recent Results (from the past 240 hour(s))  MRSA PCR Screening     Status: Abnormal   Collection Time: 07/07/15 12:59 PM  Result Value Ref Range Status   MRSA by PCR POSITIVE (A) NEGATIVE Final    Comment:        The GeneXpert MRSA Assay (FDA approved for NASAL specimens only), is one component of a comprehensive MRSA colonization surveillance program. It is not intended to diagnose MRSA infection nor to guide or monitor treatment for MRSA infections. RESULT CALLED TO, READ BACK BY AND VERIFIED WITH: F. HASZ RN 15:30 07/07/15 ([email or web address])   Anaerobic culture     Status: None (Preliminary result)   Collection Time: 07/07/15  5:57 PM  Result Value Ref Range Status   Specimen Description CSF  Final   Special Requests NONE  Final   Gram Stain   Final    CYTOSPIN SMEAR WBC PRESENT, PREDOMINANTLY MONONUCLEAR NO ORGANISMS SEEN    Culture   Final    NO ANAEROBES ISOLATED; CULTURE IN PROGRESS FOR 5 DAYS   Report Status PENDING  Incomplete  CSF culture     Status: None (Preliminary result)   Collection Time: 07/07/15  5:57 PM  Result Value Ref Range Status   Specimen Description CSF  Final   Special Requests NONE  Final   Gram Stain   Final    WBC PRESENT, PREDOMINANTLY MONONUCLEAR NO ORGANISMS SEEN CYTOSPIN    Culture NO GROWTH 2 DAYS  Final   Report Status PENDING  Incomplete  Culture, blood (routine x 2)     Status: None (Preliminary result)   Collection Time: 07/07/15  6:55 PM  Result Value Ref Range Status   Specimen Description BLOOD RIGHT FOREARM  Final   Special Requests BOTTLES DRAWN AEROBIC ONLY [place]  Final   Culture NO GROWTH 2 DAYS  Final   Report Status PENDING  Incomplete  Culture, blood (Routine X 2) w Reflex to ID Panel     Status: None (Preliminary result)   Collection Time: 07/07/15  9:25 PM  Result Value Ref Range  Status   Specimen Description BLOOD RIGHT HAND  Final   Special Requests   Final    BOTTLES DRAWN AEROBIC AND ANAEROBIC BLUE 10CC RED [place]   Culture NO GROWTH 2 DAYS  Final   Report Status PENDING  Incomplete  Studies/Results: Mr Brain Wo Contrast  07/08/2015  CLINICAL DATA:  Headache with nausea and vomiting. HIV, and ESRD, hypertension EXAM: MRI HEAD WITHOUT CONTRAST TECHNIQUE: Multiplanar, multiecho pulse sequences of the brain and surrounding structures were obtained without intravenous contrast. COMPARISON:  CT head 07/06/2015 FINDINGS: T2 hyperintensity in the basal ganglia bilaterally. This is in the caudate and putamen and is symmetric. The thalamus is spared. Brainstem and cerebellum normal. Cerebral white matter normal. No areas of restricted diffusion.  Negative for hemorrhage. Negative for mass or edema. No fluid collection or shift of the midline structures. Paranasal sinuses clear. Pituitary and skull base normal. Normal orbit. IMPRESSION: Symmetric T2 hyperintensity in the caudate and putamen bilaterally without restricted diffusion. Given the history of HIV, HIV encephalopathy is a consideration. Metabolic or toxic encephalopathy possible. These lesions are not hyperintense on T1 as typically seen with hepatic encephalopathy or hyperglycemia. Extra pontine myelinolysis is a consideration. Electronically Signed   By: Franchot Gallo M.D.   On: 07/08/2015 21:35   Dg Fluoro Guide Lumbar Puncture  07/07/2015  CLINICAL DATA:  HIV with fever, headache and blurred vision. EXAM: DIAGNOSTIC LUMBAR PUNCTURE UNDER FLUOROSCOPIC GUIDANCE FLUOROSCOPY TIME:  Radiation Exposure Index (as provided by the fluoroscopic device): 4.6 mGy If the device does not provide the exposure index: Fluoroscopy Time (in minutes and seconds):  0 minutes and 56 seconds Number of Acquired Images: PROCEDURE: Informed consent was obtained from the patient prior to the procedure, including potential complications of headache,  allergy, and pain. With the patient prone, the lower back was prepped with Betadine. 1% Lidocaine was used for local anesthesia. Lumbar puncture was performed at the L4-5 level using a 20 gauge needle with return of clear CSF. 10.5 ml of CSF were obtained for laboratory studies. The patient tolerated the procedure well and there were no apparent complications. IMPRESSION: Fluoroscopic guided lumbar puncture yielding 10.5 cc clear CSF for appropriate laboratory evaluation. Electronically Signed   By: Marijo Sanes M.D.   On: 07/07/2015 17:26     Assessment/Plan: AIDS (prev DRVc/RPV/DTGV) ESRD Meningitis (7477 WBC, 75%N)  Headache  N/v  Worsened vision Thrombocytopenia HTN  Total days of antibiotics: 3 vanco/merrem  No change in antibiotics at this time His BCx and CXF Cx are pending Hold  ART due to N/V Bowel hygiene          Bobby Rumpf Infectious Diseases (pager) [phone] [email or web address] 07/09/2015, 4:13 PM  LOS: 3 days

## 2015-07-09 NOTE — Care Management Note (Signed)
Case Management Note  Patient Details  Name: Jim Wyatt MRN: [id number] Date of Birth: 03-24-1980  Subjective/Objective:                    Action/Plan: Pt with continued medical work up. Having some issues with nausea. CM following for discharge needs.   Expected Discharge Date:                  Expected Discharge Plan:     In-House Referral:     Discharge planning Services     Post Acute Care Choice:    Choice offered to:     DME Arranged:    DME Agency:     HH Arranged:    HH Agency:     Status of Service:  In process, will continue to follow  Medicare Important Message Given:    Date Medicare IM Given:    Medicare IM give by:    Date Additional Medicare IM Given:    Additional Medicare Important Message give by:     If discussed at [place] of Stay Meetings, dates discussed:    Additional Comments:  Pollie Friar, RN 07/09/2015, 2:00 PM

## 2015-07-09 NOTE — Progress Notes (Signed)
Nurse tech states she went into room to answer bed alarm, patient was in bathroom, NT asked patient if he was OK, patient did not reply, NT attempted to open door, patient slammed door shut. Patient then ambulated back to bed, no issues. Patient upset that staff uses bed alarm. RN explained floor policy, explained patient is at risk for falls r/t blurred vision and d/t extra equipment (IV pole). Patient still refused bed alarm. Patient stated he hated this hospital, needed to leave. Patient's mom stated to patient that he needed to stay. Will continue to monitor patient.

## 2015-07-09 NOTE — Progress Notes (Signed)
Subjective: Patient seen and examined this morning on rounds while at HD.  He is still complaining of headache, blurry vision, nausea, and photophobia.  Headaches reported as improved with dilaudid interspersed with Tylenol.  Reports he vomited last night after starting his HIV medications.  He continues to be nauseous but has not been asking for his anti-emetics.  Objective: Vital signs in last 24 hours: Filed Vitals:   07/09/15 1030 07/09/15 1100 07/09/15 1107 07/09/15 1422  BP: 163/89 169/88 163/95 157/97  Pulse: 83 89 86 94  Temp:   97.9 F (36.6 C) 98.2 F (36.8 C)  TempSrc:   Oral Oral  Resp:   18 18  Height:      Weight:   149 lb 4 oz (67.7 kg)   SpO2:   100% 99%   Weight change:   Intake/Output Summary (Last 24 hours) at 07/09/15 1424 Last data filed at 07/09/15 1107  Gross per 24 hour  Intake      0 ml  Output      0 ml  Net      0 ml   General: resting in bed, not distressed, withdrawn, soft spoken HEENT: EOMI, no scleral icterus Pulm: normal effort Neuro: alert and oriented X3, cranial nerves II-XII grossly intact  Lab Results: Basic Metabolic Panel:  Recent Labs Lab 07/08/15 0516 07/09/15 0342  NA 140 139  K 4.1 4.1  CL 102 103  CO2 22 21*  GLUCOSE 97 84  BUN 60* 62*  CREATININE 12.74* 13.84*  CALCIUM 9.2 9.1  PHOS 6.6* 5.8*   Liver Function Tests:  Recent Labs Lab 07/06/15 1100  07/08/15 0516 07/09/15 0342  AST 11*  --   --   --   ALT 10*  --   --   --   ALKPHOS 41  --   --   --   BILITOT 1.5*  --   --   --   PROT 8.5*  --   --   --   ALBUMIN 3.1*  < > 3.1* 3.1*  < > = values in this interval not displayed.  Recent Labs Lab 07/06/15 1100  LIPASE 33   CBC:  Recent Labs Lab 07/07/15 0250  07/08/15 0516 07/09/15 0342  WBC 2.9*  < > 3.9* 3.9*  NEUTROABS 1.7  --   --   --   HGB 9.7*  < > 9.7* 10.0*  HCT 30.1*  < > 29.7* 30.8*  MCV 83.6  < > 84.1 84.4  PLT 54*  < > 71* 69*  < > = values in this interval not  displayed. Cardiac Enzymes:  Recent Labs Lab 07/06/15 1425 07/06/15 2210 07/07/15 0250  TROPONINI 0.04* 0.04* 0.04*   Coagulation:  Recent Labs Lab 07/07/15 1201  LABPROT 14.4  INR 1.10   Urine Drug Screen: Drugs of Abuse     Component Value Date/Time   LABOPIA POSITIVE* 07/07/2015 0759   COCAINSCRNUR NONE DETECTED 07/07/2015 0759   LABBENZ NONE DETECTED 07/07/2015 0759   AMPHETMU NONE DETECTED 07/07/2015 0759   THCU NONE DETECTED 07/07/2015 0759   LABBARB NONE DETECTED 07/07/2015 0759     Micro Results: Recent Results (from the past 240 hour(s))  MRSA PCR Screening     Status: Abnormal   Collection Time: 07/07/15 12:59 PM  Result Value Ref Range Status   MRSA by PCR POSITIVE (A) NEGATIVE Final    Comment:        The GeneXpert MRSA Assay (FDA approved  for NASAL specimens only), is one component of a comprehensive MRSA colonization surveillance program. It is not intended to diagnose MRSA infection nor to guide or monitor treatment for MRSA infections. RESULT CALLED TO, READ BACK BY AND VERIFIED WITH: F. HASZ RN 15:30 07/07/15 ([email or web address])   Anaerobic culture     Status: None (Preliminary result)   Collection Time: 07/07/15  5:57 PM  Result Value Ref Range Status   Specimen Description CSF  Final   Special Requests NONE  Final   Gram Stain   Final    CYTOSPIN SMEAR WBC PRESENT, PREDOMINANTLY MONONUCLEAR NO ORGANISMS SEEN    Culture   Final    NO ANAEROBES ISOLATED; CULTURE IN PROGRESS FOR 5 DAYS   Report Status PENDING  Incomplete  CSF culture     Status: None (Preliminary result)   Collection Time: 07/07/15  5:57 PM  Result Value Ref Range Status   Specimen Description CSF  Final   Special Requests NONE  Final   Gram Stain   Final    WBC PRESENT, PREDOMINANTLY MONONUCLEAR NO ORGANISMS SEEN CYTOSPIN    Culture NO GROWTH 2 DAYS  Final   Report Status PENDING  Incomplete  Culture, blood (routine x 2)     Status: None (Preliminary result)    Collection Time: 07/07/15  6:55 PM  Result Value Ref Range Status   Specimen Description BLOOD RIGHT FOREARM  Final   Special Requests BOTTLES DRAWN AEROBIC ONLY [place]  Final   Culture NO GROWTH < 24 HOURS  Final   Report Status PENDING  Incomplete  Culture, blood (Routine X 2) w Reflex to ID Panel     Status: None (Preliminary result)   Collection Time: 07/07/15  9:25 PM  Result Value Ref Range Status   Specimen Description BLOOD RIGHT HAND  Final   Special Requests   Final    BOTTLES DRAWN AEROBIC AND ANAEROBIC BLUE 10CC RED [place]   Culture NO GROWTH < 24 HOURS  Final   Report Status PENDING  Incomplete   Studies/Results: Mr Brain Wo Contrast  07/08/2015  CLINICAL DATA:  Headache with nausea and vomiting. HIV, and ESRD, hypertension EXAM: MRI HEAD WITHOUT CONTRAST TECHNIQUE: Multiplanar, multiecho pulse sequences of the brain and surrounding structures were obtained without intravenous contrast. COMPARISON:  CT head 07/06/2015 FINDINGS: T2 hyperintensity in the basal ganglia bilaterally. This is in the caudate and putamen and is symmetric. The thalamus is spared. Brainstem and cerebellum normal. Cerebral white matter normal. No areas of restricted diffusion.  Negative for hemorrhage. Negative for mass or edema. No fluid collection or shift of the midline structures. Paranasal sinuses clear. Pituitary and skull base normal. Normal orbit. IMPRESSION: Symmetric T2 hyperintensity in the caudate and putamen bilaterally without restricted diffusion. Given the history of HIV, HIV encephalopathy is a consideration. Metabolic or toxic encephalopathy possible. These lesions are not hyperintense on T1 as typically seen with hepatic encephalopathy or hyperglycemia. Extra pontine myelinolysis is a consideration. Electronically Signed   By: Franchot Gallo M.D.   On: 07/08/2015 21:35   Dg Fluoro Guide Lumbar Puncture  07/07/2015  CLINICAL DATA:  HIV with fever, headache and blurred vision. EXAM: DIAGNOSTIC LUMBAR  PUNCTURE UNDER FLUOROSCOPIC GUIDANCE FLUOROSCOPY TIME:  Radiation Exposure Index (as provided by the fluoroscopic device): 4.6 mGy If the device does not provide the exposure index: Fluoroscopy Time (in minutes and seconds):  0 minutes and 56 seconds Number of Acquired Images: PROCEDURE: Informed consent was obtained from the patient prior to  the procedure, including potential complications of headache, allergy, and pain. With the patient prone, the lower back was prepped with Betadine. 1% Lidocaine was used for local anesthesia. Lumbar puncture was performed at the L4-5 level using a 20 gauge needle with return of clear CSF. 10.5 ml of CSF were obtained for laboratory studies. The patient tolerated the procedure well and there were no apparent complications. IMPRESSION: Fluoroscopic guided lumbar puncture yielding 10.5 cc clear CSF for appropriate laboratory evaluation. Electronically Signed   By: Marijo Sanes M.D.   On: 07/07/2015 17:26   Medications: I have reviewed the patient's current medications. Scheduled Meds: . calcitRIOL  0.25 mcg Oral Q T,Th,Sa-HD  . calcium carbonate  200 mg of elemental calcium Oral TID WC  . carvedilol  12.5 mg Oral BID WC  . [START ON 07/10/2015] Chlorhexidine Gluconate Cloth  6 each Topical Q0600  . [START ON 07/10/2015] darunavir-cobicistat  1 tablet Oral Q breakfast  . diltiazem  120 mg Oral Daily  . dolutegravir  50 mg Oral Daily  . hydrALAZINE  25 mg Oral Q6H  .  HYDROmorphone (DILAUDID) injection  0.5 mg Intravenous Once  . meropenem (MERREM) IV  500 mg Intravenous Q24H  . multivitamin  1 tablet Oral QHS  . mupirocin ointment  1 application Nasal BID  . promethazine  12.5 mg Oral Q6H   Or  . prochlorperazine  5 mg Intravenous Q6H   Or  . promethazine  12.5 mg Rectal Q6H  . rilpivirine  25 mg Oral Q breakfast  . 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  May be due to blood pressure and non-adherence to HD.  MRI completed yesterday with consideration for HIV encephalopathy vs metabolic vs toxic encephalopathy based on symmetric T2 hyperintensity in the caudate and putamen bilaterally without restricted diffusion.  Given his profound immune compromise, infection still remains very much a concern. - appreciate infectious disease for following and their recommendations - restarted ART yesterday, at risk for IRIS - treatment for HIV encephalopathy is ART compliance.  We will schedule his anti-emetics to be given at times of getting ART - continue dilaudid prn, tylenol prn - continue vancomycin and meropenem with concern for atypical meningitis presentation - continuing blood pressure control as below - await further LP culture data  Nausea & Vomiting: still having nausea, not much vomiting.  Advance diet and see how he does.  Patient believes medications make him nauseous - renal diet - pherergan or compazine q6h scheduled  HTN: patient on Carvedilol 12.5mg  BID at home but has not taken for past week prior to admission - continue carvedilol 12.5mg  PO BID - continue hydralazine to 25mg  PO Q6H - add diltiazem 120mg  PO daily - labetalol 10mg  IV Q2H prn for SBP > 160 or DBP >95  HIV Disease: patient has not been taking ART since March and has been non-adherent in the past. Supposed to be on rilpivirine,  Tivicay, and Prezcobix. He also has not been taking his Dapsone for OI prophylaxis. - restarted his HIV medications yesterday, hopefully he will be able to continue these - scheduled anti-emetics as above - CD4 10 - VL 86,000  ESRD: he is on Tues, Thurs, Sat dialysis. He has been non-adherent recently with his HD so symptoms may be in part due to uremia as well - nephrology consult - HD today   Non-Adherence to Treatment: Patient has been non-adherent to therapy for HIV, HTN, and HD.  Reports nausea and adverse effects to treatment as a large contributor but wonder if there is an underlying depression component - administer PHQ-9 this afternoon  Normocytic Anemia: Likely 2/2 to chronic disease - follow for now  Thrombocytopenia: chronic, no signs of obvious bleeding. Likely 2/2 to chronic disease state - follow for now  Diet: Clear Liquid  Code: Full  Dispo: Disposition is deferred at this time, awaiting improvement of current medical problems.  Anticipated discharge in approximately 2-3 day(s).   The patient does have a current PCP ([name], MD) and does not need an [facility] hospital follow-up appointment after discharge.  The patient does not have transportation limitations that hinder transportation to clinic appointments.  .Services Needed at time of discharge: Y = Yes, Blank = No PT:   OT:   RN:   Equipment:   Other:     LOS: 3 days   Jule Ser, DO 07/09/2015, 2:24 PM

## 2015-07-09 NOTE — Progress Notes (Signed)
Subjective: Mr Jim Wyatt was seen and examined on morning rounds today.  He was receiving dialysis when we saw him.  Continues to complain of generalized headache, blurry vision (L>R), and nausea.  Headache improved with Dilaudid q6 and Tylenol in between doses of Dilaudid.  He said he vomited once last night after re-starting his HIV medications.  He is frustrated that he continues to be nauseous, despite the fact that he has not asked for any anti-emetics.  Blood pressure continues to be elevated with systolics in the 123456.  Discussed MRI results showing HIV encephalopathy and importance of continuing HAART therapy - patient states his nausea makes him unable to take medication.   Objective: Vital signs in last 24 hours: Filed Vitals:   07/09/15 1000 07/09/15 1030 07/09/15 1100 07/09/15 1107  BP: 173/91 163/89 169/88 163/95  Pulse: 92 83 89 86  Temp:    97.9 F (36.6 C)  TempSrc:    Oral  Resp:    18  Height:      Weight:    67.7 kg (149 lb 4 oz)  SpO2:    100%   Weight change:   Intake/Output Summary (Last 24 hours) at 07/09/15 1237 Last data filed at 07/09/15 1107  Gross per 24 hour  Intake      0 ml  Output      0 ml  Net      0 ml   General: Moody, frustrated.  NAD.  Respiratory: breathing unlabored Heart: RRR. No JVD.  Abdomen: Soft, non-distended   Lab Results: CBC    Component Value Date/Time   WBC 3.9* 07/09/2015 0342   RBC 3.65* 07/09/2015 0342   RBC <1.00* 01/12/2015 1617   HGB 10.0* 07/09/2015 0342   HCT 30.8* 07/09/2015 0342   PLT 69* 07/09/2015 0342   MCV 84.4 07/09/2015 0342   MCH 27.4 07/09/2015 0342   MCHC 32.5 07/09/2015 0342   RDW 16.7* 07/09/2015 0342   LYMPHSABS 0.4* 07/07/2015 0250   MONOABS 0.5 07/07/2015 0250   EOSABS 0.3 07/07/2015 0250   BASOSABS 0.0 07/07/2015 0250     BMP Latest Ref Rng 07/09/2015 07/08/2015 07/07/2015  Glucose 65 - 99 mg/dL 84 97 79  BUN 6 - 20 mg/dL 62(H) 60(H) 54(H)  Creatinine 0.61 - 1.24 mg/dL 13.84(H) 12.74(H)  11.46(H)  Sodium 135 - 145 mmol/L 139 140 139  Potassium 3.5 - 5.1 mmol/L 4.1 4.1 4.3  Chloride 101 - 111 mmol/L 103 102 101  CO2 22 - 32 mmol/L 21(L) 22 25  Calcium 8.9 - 10.3 mg/dL 9.1 9.2 8.8(L)    Acid fast, fungi, anaerobic CSF cultures pending JC, HSV, CMV pending  Negative cultures: Cryptococcal, VDRL, Toxo   Micro Results: Recent Results (from the past 240 hour(s))  MRSA PCR Screening     Status: Abnormal   Collection Time: 07/07/15 12:59 PM  Result Value Ref Range Status   MRSA by PCR POSITIVE (A) NEGATIVE Final    Comment:        The GeneXpert MRSA Assay (FDA approved for NASAL specimens only), is one component of a comprehensive MRSA colonization surveillance program. It is not intended to diagnose MRSA infection nor to guide or monitor treatment for MRSA infections. RESULT CALLED TO, READ BACK BY AND VERIFIED WITH: F. HASZ RN 15:30 07/07/15 ([email or web address])   Anaerobic culture     Status: None (Preliminary result)   Collection Time: 07/07/15  5:57 PM  Result Value Ref Range Status   Specimen Description CSF  Final   Special Requests NONE  Final   Gram Stain   Final    CYTOSPIN SMEAR WBC PRESENT, PREDOMINANTLY MONONUCLEAR NO ORGANISMS SEEN    Culture   Final    NO ANAEROBES ISOLATED; CULTURE IN PROGRESS FOR 5 DAYS   Report Status PENDING  Incomplete  CSF culture     Status: None (Preliminary result)   Collection Time: 07/07/15  5:57 PM  Result Value Ref Range Status   Specimen Description CSF  Final   Special Requests NONE  Final   Gram Stain   Final    WBC PRESENT, PREDOMINANTLY MONONUCLEAR NO ORGANISMS SEEN CYTOSPIN    Culture NO GROWTH 2 DAYS  Final   Report Status PENDING  Incomplete  Culture, blood (routine x 2)     Status: None (Preliminary result)   Collection Time: 07/07/15  6:55 PM  Result Value Ref Range Status   Specimen Description BLOOD RIGHT FOREARM  Final   Special Requests BOTTLES DRAWN AEROBIC ONLY [place]  Final   Culture NO GROWTH < 24  HOURS  Final   Report Status PENDING  Incomplete  Culture, blood (Routine X 2) w Reflex to ID Panel     Status: None (Preliminary result)   Collection Time: 07/07/15  9:25 PM  Result Value Ref Range Status   Specimen Description BLOOD RIGHT HAND  Final   Special Requests   Final    BOTTLES DRAWN AEROBIC AND ANAEROBIC BLUE 10CC RED [place]   Culture NO GROWTH < 24 HOURS  Final   Report Status PENDING  Incomplete   Studies/Results: Mr Brain Wo Contrast  07/08/2015  CLINICAL DATA:  Headache with nausea and vomiting. HIV, and ESRD, hypertension EXAM: MRI HEAD WITHOUT CONTRAST TECHNIQUE: Multiplanar, multiecho pulse sequences of the brain and surrounding structures were obtained without intravenous contrast. COMPARISON:  CT head 07/06/2015 FINDINGS: T2 hyperintensity in the basal ganglia bilaterally. This is in the caudate and putamen and is symmetric. The thalamus is spared. Brainstem and cerebellum normal. Cerebral white matter normal. No areas of restricted diffusion.  Negative for hemorrhage. Negative for mass or edema. No fluid collection or shift of the midline structures. Paranasal sinuses clear. Pituitary and skull base normal. Normal orbit. IMPRESSION: Symmetric T2 hyperintensity in the caudate and putamen bilaterally without restricted diffusion. Given the history of HIV, HIV encephalopathy is a consideration. Metabolic or toxic encephalopathy possible. These lesions are not hyperintense on T1 as typically seen with hepatic encephalopathy or hyperglycemia. Extra pontine myelinolysis is a consideration. Electronically Signed   By: Franchot Gallo M.D.   On: 07/08/2015 21:35   Dg Fluoro Guide Lumbar Puncture  07/07/2015  CLINICAL DATA:  HIV with fever, headache and blurred vision. EXAM: DIAGNOSTIC LUMBAR PUNCTURE UNDER FLUOROSCOPIC GUIDANCE FLUOROSCOPY TIME:  Radiation Exposure Index (as provided by the fluoroscopic device): 4.6 mGy If the device does not provide the exposure index: Fluoroscopy Time  (in minutes and seconds):  0 minutes and 56 seconds Number of Acquired Images: PROCEDURE: Informed consent was obtained from the patient prior to the procedure, including potential complications of headache, allergy, and pain. With the patient prone, the lower back was prepped with Betadine. 1% Lidocaine was used for local anesthesia. Lumbar puncture was performed at the L4-5 level using a 20 gauge needle with return of clear CSF. 10.5 ml of CSF were obtained for laboratory studies. The patient tolerated the procedure well and there were no apparent complications. IMPRESSION: Fluoroscopic guided lumbar puncture yielding 10.5 cc clear CSF for  appropriate laboratory evaluation. Electronically Signed   By: Marijo Sanes M.D.   On: 07/07/2015 17:26   Medications: I have reviewed the patient's current medications. Scheduled Meds: . calcitRIOL  0.25 mcg Oral Q T,Th,Sa-HD  . calcium carbonate  200 mg of elemental calcium Oral TID WC  . carvedilol  12.5 mg Oral BID WC  . darunavir-cobicistat  1 tablet Oral Daily  . diltiazem  120 mg Oral Daily  . dolutegravir  50 mg Oral Daily  . hydrALAZINE  25 mg Oral Q6H  .  HYDROmorphone (DILAUDID) injection  0.5 mg Intravenous Once  . meropenem (MERREM) IV  500 mg Intravenous Q24H  . multivitamin  1 tablet Oral QHS  . promethazine  12.5 mg Oral Q6H   Or  . prochlorperazine  5 mg Intravenous Q6H   Or  . promethazine  12.5 mg Rectal Q6H  . rilpivirine  25 mg Oral Q breakfast  . sodium chloride flush  3 mL Intravenous Q12H   Continuous Infusions:  PRN Meds:.acetaminophen, HYDROmorphone (DILAUDID) injection, labetalol, sodium chloride flush, vancomycin Assessment/Plan: 35 yo M with hx of HIV/CD4 <20 and Stage 5 CKD presenting with 1 week of Ha, worsening today, as well as 1 week of n/v.   Headache/blurry vision Continues to endorse headache and blurry vision, vision more blurry in L eye than R. Headache only temporarily quelled with Dilaudid.  MRI findings  from yesterday showed: "Symmetric T2 hyperintensity in the caudate and putamen bilaterally without restricted diffusion. Given the history of HIV, HIV encephalopathy is a consideration. Metabolic or toxic encephalopathy possible. These lesions are not hyperintense on T1 as typically seen with hepatic encephalopathy or hyperglycemia. Extra pontine myelinolysis is a consideration."  Treatment for HIV encephalopathy is HAART compliance, which was restarted yesterday.   -ID following, appreciate recs -Restarted HAART therapy (home meds) yesterday - Prezcobix, Tivicay, Edurant.  Continue HAART.   -Per ID recommendation, continue on Vancomycin and Miripenem for possible aseptic meningitis.  Will continue until lab results back.    -Dilaudid PRN q4 for headache  -Tylenol 650 mg PRN for headache   Nausea/vomiting One episode of vomiting yesterday after receiving HAART meds. Continues to be nauseous.  Pt believes medications causing nausea.  -Compazine q6 (NOT PRN)   HTN Remains hypertensive (systolics in 123456).  Is on Coreg 12.5 mg bid and increased hydralazine to 25mg  q6 yesterday, without improvement. -Continue Coreg 12.5 mg bid and hydralazine 25mg  -Start Diltiazem 120mg  qday   CKD T/Th/Sun dialysis.  Received dialysis today, was able to tolerate. -Nephrology following, appreciate recs  HIV  Pt had been of HAART meds since March.  Meds were restarted yesterday but pt states he vomited after taking medication.  -Continue HIV meds - Prezcobix, Tivicay, Edurant.  -Start Compazine q6 to hopefully quell nausea.   This is a Careers information officer Note.  The care of the patient was discussed with Dr. Jule Ser and the assessment and plan formulated with their assistance.  Please see their attached note for official documentation of the daily encounter.   LOS: 3 days   Randal Buba, Med Student 07/09/2015, 12:37 PM

## 2015-07-10 DIAGNOSIS — D849 Immunodeficiency, unspecified: Secondary | ICD-10-CM

## 2015-07-10 LAB — CBC
HCT: 29.6 % — ABNORMAL LOW (ref 39.0–52.0)
Hemoglobin: 9.7 g/dL — ABNORMAL LOW (ref 13.0–17.0)
MCH: 28 pg (ref 26.0–34.0)
MCHC: 32.8 g/dL (ref 30.0–36.0)
MCV: 85.5 fL (ref 78.0–100.0)
Platelets: 75 10*3/uL — ABNORMAL LOW (ref 150–400)
RBC: 3.46 MIL/uL — ABNORMAL LOW (ref 4.22–5.81)
RDW: 16.5 % — ABNORMAL HIGH (ref 11.5–15.5)
WBC: 3.1 10*3/uL — ABNORMAL LOW (ref 4.0–10.5)

## 2015-07-10 LAB — RENAL FUNCTION PANEL
Albumin: 2.9 g/dL — ABNORMAL LOW (ref 3.5–5.0)
Anion gap: 10 (ref 5–15)
BUN: 22 mg/dL — ABNORMAL HIGH (ref 6–20)
CO2: 28 mmol/L (ref 22–32)
Calcium: 8.7 mg/dL — ABNORMAL LOW (ref 8.9–10.3)
Chloride: 99 mmol/L — ABNORMAL LOW (ref 101–111)
Creatinine, Ser: 7.99 mg/dL — ABNORMAL HIGH (ref 0.61–1.24)
GFR calc Af Amer: 9 mL/min — ABNORMAL LOW (ref >60)
GFR calc non Af Amer: 8 mL/min — ABNORMAL LOW (ref >60)
Glucose, Bld: 114 mg/dL — ABNORMAL HIGH (ref 65–99)
Phosphorus: 4.3 mg/dL (ref 2.5–4.6)
Potassium: 3.6 mmol/L (ref 3.5–5.1)
Sodium: 137 mmol/L (ref 135–145)

## 2015-07-10 LAB — CMV DNA, QUANTITATIVE, PCR
CMV DNA Quant: NEGATIVE IU/mL
Log10 CMV Qn DNA Pl: UNDETERMINED log10 IU/mL

## 2015-07-10 MED ORDER — DILTIAZEM HCL ER COATED BEADS 180 MG PO CP24: 180.0000 mg | ORAL_CAPSULE | Freq: Every day | ORAL | Status: DC

## 2015-07-10 MED ORDER — PROMETHAZINE HCL 12.5 MG PO TABS: 12.5000 mg | ORAL_TABLET | Freq: Four times a day (QID) | ORAL | Status: DC | PRN

## 2015-07-10 MED ORDER — CALCIUM CARBONATE ANTACID 500 MG PO CHEW: 200.0000 mg | CHEWABLE_TABLET | Freq: Three times a day (TID) | ORAL | Status: DC

## 2015-07-10 MED ORDER — HYDRALAZINE HCL 25 MG PO TABS: 25.0000 mg | ORAL_TABLET | Freq: Three times a day (TID) | ORAL | Status: DC

## 2015-07-10 MED ORDER — ALBUTEROL SULFATE HFA 108 (90 BASE) MCG/ACT IN AERS: 2.0000 | INHALATION_SPRAY | Freq: Four times a day (QID) | RESPIRATORY_TRACT | Status: DC | PRN

## 2015-07-10 MED ORDER — HYDROCODONE-ACETAMINOPHEN 5-325 MG PO TABS: 1.0000 | ORAL_TABLET | Freq: Four times a day (QID) | ORAL | Status: DC | PRN

## 2015-07-10 MED ORDER — RENA-VITE PO TABS: 1.0000 | ORAL_TABLET | Freq: Every day | ORAL | Status: DC

## 2015-07-10 NOTE — Progress Notes (Signed)
Pt discharging at this time taking all personal belongings. IV discontinued, dry dressing applied. Discharge instructions provided along with prescriptions with verbal understanding. Pt denies pain or discomfort. No noted distress. Pt aware to follow up appt.

## 2015-07-10 NOTE — Progress Notes (Signed)
Patient ID: Jim Wyatt, male   DOB: [birth year], 35 y.o.   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 calcitRIOL  0.25 mcg Oral Q T,Th,Sa-HD  . calcium carbonate  200 mg of elemental calcium Oral TID WC  . carvedilol  12.5 mg Oral BID WC  . Chlorhexidine Gluconate Cloth  6 each Topical Q0600  . diltiazem  120 mg Oral Daily  . hydrALAZINE  25 mg Oral Q6H  .  HYDROmorphone (DILAUDID) injection  0.5 mg Intravenous Once  . meropenem (MERREM) IV  500 mg Intravenous Q24H  . multivitamin  1 tablet Oral QHS  . mupirocin ointment  1 application Nasal BID  . polyethylene glycol  17 g Oral Daily  . promethazine  12.5 mg Oral Q6H   Or  . prochlorperazine  5 mg Intravenous Q6H   Or  . promethazine  12.5 mg Rectal Q6H  . sodium chloride flush  3 mL Intravenous Q12H    SUBJECTIVE: He tells me that he is feeling much better. He is no longer having any headache and his nausea has improved. He was able to eat lunch today without any difficulty. He still has a gritty sensation in both eyes (left greater than right) but this has improved and he no longer has significant blurred vision. He states that he has always had difficulty tolerating his antiretroviral medications. He has been taking them infrequently over the past several months because he says that a cause nausea and vomiting. He has not tried taking his HIV medications since admission. He tells me that he  feels like vancomycin makes him anxious and he does not want to take anymore.  Review of Systems: Review of Systems  Constitutional: Negative for fever, chills, weight loss, malaise/fatigue and diaphoresis.  HENT: Negative for sore throat.   Eyes: Positive for pain. Negative for blurred vision.  Respiratory: Negative for cough, sputum production and shortness of breath.   Cardiovascular: Negative for chest pain.  Gastrointestinal: Negative for nausea, vomiting and diarrhea.  Genitourinary: Negative for dysuria and frequency.  Musculoskeletal: Negative for myalgias, joint pain and neck pain.  Skin: Negative for rash.  Neurological: Negative for dizziness, focal weakness and headaches.    Past Medical History  Diagnosis Date  . Shingles < 2010  . HIV infection ([place]) dx'd 10/2013  . Cellulitis 11/2013    LLE  . CAP (community acquired pneumonia) 10/2013  . DVT (deep venous thrombosis) ([place]) 10/2013    BLE  . Multiple food allergies   . Multiple environmental allergies     "trees, dogs, cats"  . Hypertension   . Fever   . Leg pain   . Chronic kidney disease   . 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 Asthma   . Headache     Social History  Substance Use Topics  . Smoking status:  Never Smoker   . Smokeless tobacco: Never Used  . Alcohol Use: Yes     Comment: I stopped drinking along time ago "    Family History  Problem Relation Age of Onset  . Diabetes Maternal Grandmother   . Kidney disease Maternal Grandmother   . Hypertension Mother    Allergies  Allergen Reactions  . Bactrim [Sulfamethoxazole-Trimethoprim] Other (See Comments)    Renal failure  . Cauliflower [Brassica Oleracea Italica] Anaphylaxis and Other (See Comments)    Pt states his throat closes up  . Vancomycin Other (See Comments)    Patient states vancomycin caused kidney injury  . Amlodipine Palpitations    fatigue  . Augmentin [Amoxicillin-Pot Clavulanate] Other (See Comments)    unknown  .  Banana Other (See Comments)  . Fish Allergy Hives  . Onion Rash    OBJECTIVE: Filed Vitals:   07/10/15 0505 07/10/15 0832 07/10/15 1038 07/10/15 1237  BP: 144/89 154/94 149/90 151/91  Pulse: 96 105  96  Temp: 98.4 F (36.9 C) 98.7 F (37.1 C) 98 F (36.7 C) 98.2 F (36.8 C)  TempSrc: Oral Oral Oral Oral  Resp: 20 18 18 18   Height:      Weight:      SpO2: 98% 99% 99% 100%   Body mass index is 23.37 kg/(m^2).  Physical Exam  Constitutional: He is oriented to person, place, and time.  He is alert, comfortable and talkative.  HENT:  Mouth/Throat: No oropharyngeal exudate.  Eyes: Conjunctivae are normal. Left eye exhibits no discharge.  Neck: Neck supple.  Cardiovascular: Normal rate and regular rhythm.   No murmur heard. Pulmonary/Chest: Breath sounds normal. He has no wheezes. He has no rales.  Abdominal: Soft. He exhibits no mass. There is no tenderness.  Musculoskeletal: Normal range of motion. He exhibits no edema or tenderness.  Neurological: He is alert and oriented to person, place, and time.  Skin: No rash noted.  Psychiatric: Mood and affect normal.    Lab Results Lab Results  Component Value Date   WBC 3.1* 07/10/2015   HGB 9.7* 07/10/2015   HCT 29.6* 07/10/2015   MCV 85.5 07/10/2015   PLT 75* 07/10/2015    Lab Results  Component Value Date   CREATININE 7.99* 07/10/2015   BUN 22* 07/10/2015   NA 137 07/10/2015   K 3.6 07/10/2015   CL 99* 07/10/2015   CO2 28 07/10/2015    Lab Results  Component Value Date   ALT 10* 07/06/2015   AST 11* 07/06/2015   ALKPHOS 41 07/06/2015   BILITOT 1.5* 07/06/2015     Microbiology: Recent Results (from the past 240 hour(s))  MRSA PCR Screening     Status: Abnormal   Collection Time: 07/07/15 12:59 PM  Result Value Ref Range Status   MRSA by PCR POSITIVE (A) NEGATIVE Final    Comment:        The GeneXpert MRSA Assay (FDA approved for NASAL specimens only), is one component of a comprehensive MRSA  colonization surveillance program. It is not intended to diagnose MRSA infection nor to guide or monitor treatment for MRSA infections. RESULT CALLED TO, READ BACK BY AND VERIFIED WITH: F. HASZ RN 15:30 07/07/15 ([email or web address])   Anaerobic culture     Status: None (Preliminary result)   Collection Time: 07/07/15  5:57 PM  Result Value Ref Range Status   Specimen Description CSF  Final   Special Requests NONE  Final   Gram Stain   Final  CYTOSPIN SMEAR WBC PRESENT, PREDOMINANTLY MONONUCLEAR NO ORGANISMS SEEN    Culture   Final    NO ANAEROBES ISOLATED; CULTURE IN PROGRESS FOR 5 DAYS   Report Status PENDING  Incomplete  CSF culture     Status: None (Preliminary result)   Collection Time: 07/07/15  5:57 PM  Result Value Ref Range Status   Specimen Description CSF  Final   Special Requests NONE  Final   Gram Stain   Final    WBC PRESENT, PREDOMINANTLY MONONUCLEAR NO ORGANISMS SEEN CYTOSPIN    Culture NO GROWTH 3 DAYS  Final   Report Status PENDING  Incomplete  Culture, blood (routine x 2)     Status: None (Preliminary result)   Collection Time: 07/07/15  6:55 PM  Result Value Ref Range Status   Specimen Description BLOOD RIGHT FOREARM  Final   Special Requests BOTTLES DRAWN AEROBIC ONLY [place]  Final   Culture NO GROWTH 3 DAYS  Final   Report Status PENDING  Incomplete  Culture, blood (Routine X 2) w Reflex to ID Panel     Status: None (Preliminary result)   Collection Time: 07/07/15  9:25 PM  Result Value Ref Range Status   Specimen Description BLOOD RIGHT HAND  Final   Special Requests   Final    BOTTLES DRAWN AEROBIC AND ANAEROBIC BLUE 10CC RED [place]   Culture NO GROWTH 3 DAYS  Final   Report Status PENDING  Incomplete     ASSESSMENT: I'm not certain what caused his headache, nausea and blurred vision. His CSF results are fairly unimpressive and totally nonspecific. He only had 2 white blood cells and some elevation of his protein. His cryptococcal antigen is negative. CSF  and blood cultures are negative. I doubt that he has bacterial meningitis. He does not want to take anymore vancomycin so I will stop both vancomycin and meropenem for now. He does not feel like he can tolerate his antiretroviral medications. This will need to be addressed as an outpatient.  PLAN: 1. Discontinue vancomycin and meropenem 2. Home soon with outpatient follow-up if he continues to improve  Michel Bickers, MD [facility] for [place] [phone] pager   [phone] cell 07/10/2015, 1:43 PM

## 2015-07-10 NOTE — Progress Notes (Addendum)
[facility] ROUNDING NOTE   Subjective:   Interval History:  No complaints  Restless night   Objective:  Vital signs in last 24 hours:  Temp:  [97.9 F (36.6 C)-98.7 F (37.1 C)] 98.7 F (37.1 C) (05/13 0832) Pulse Rate:  [83-107] 105 (05/13 0832) Resp:  [15-20] 18 (05/13 0832) BP: (142-173)/(74-101) 154/94 mmHg (05/13 0832) SpO2:  [98 %-100 %] 99 % (05/13 0832) Weight:  [67.7 kg (149 lb 4 oz)] 67.7 kg (149 lb 4 oz) (05/12 1107)  Weight change: -3.614 kg (-7 lb 15.5 oz) Filed Weights   07/09/15 0559 07/09/15 0718 07/09/15 1107  Weight: 71.714 kg (158 lb 1.6 oz) 68.1 kg (150 lb 2.1 oz) 67.7 kg (149 lb 4 oz)    Intake/Output: I/O last 3 completed shifts: In: 240 [P.O.:240] Out: 0    Intake/Output this shift:     General: alert thin AA M Chronically ill appearing NAD HEENT: EOMI, no icterus, MM dry  Cardiac: RRR, no rubs, murmurs or gallops Pulm: CTA Bilaterally,nonlabored breathjing Abd: soft, nondistended, BS + normal, mild diffuse tenderness Extremities: trace pedal edema. Left AV fistula with thrill, mild swelling of left forearm Neuro: alert/ oriented X3, Moves all exterm. No gross focal deficits Skin: warm, dry, no overt rash, no pedal ulcera Dialysis Access L FA AVF pos bruit And swelling sec Infiltration appearance / R IL perm   Dialysis Orders: [facility] . EDW = 71.0 ( yesrt pre 69.9) HD Bath 2k,2ca Time 3hr 42min Heparin 5000. Access LFA AVF and R IJ perm cath  Clacitriol 0.36mcg po /HD    Basic Metabolic Panel:  Recent Labs Lab 07/06/15 1100 07/07/15 0250 07/08/15 0516 07/09/15 0342 07/10/15 0310  NA 137 139 140 139 137  K 3.9 4.3 4.1 4.1 3.6  CL 99* 101 102 103 99*  CO2 27 25 22  21* 28  GLUCOSE 95 79 97 84 114*  BUN 42* 54* 60* 62* 22*  CREATININE 8.99* 11.46* 12.74* 13.84* 7.99*  CALCIUM 8.4* 8.8* 9.2 9.1 8.7*  PHOS  --  6.7* 6.6* 5.8* 4.3    Liver Function Tests:  Recent Labs Lab  07/06/15 1100 07/07/15 0250 07/08/15 0516 07/09/15 0342 07/10/15 0310  AST 11*  --   --   --   --   ALT 10*  --   --   --   --   ALKPHOS 41  --   --   --   --   BILITOT 1.5*  --   --   --   --   PROT 8.5*  --   --   --   --   ALBUMIN 3.1* 3.0* 3.1* 3.1* 2.9*    Recent Labs Lab 07/06/15 1100  LIPASE 33   No results for input(s): AMMONIA in the last 168 hours.  CBC:  Recent Labs Lab 07/07/15 0250 07/07/15 2125 07/08/15 0516 07/09/15 0342 07/10/15 0311  WBC 2.9* 4.0 3.9* 3.9* 3.1*  NEUTROABS 1.7  --   --   --   --   HGB 9.7* 10.1* 9.7* 10.0* 9.7*  HCT 30.1* 30.6* 29.7* 30.8* 29.6*  MCV 83.6 84.1 84.1 84.4 85.5  PLT 54* 76* 71* 69* 75*    Cardiac Enzymes:  Recent Labs Lab 07/06/15 1425 07/06/15 2210 07/07/15 0250  TROPONINI 0.04* 0.04* 0.04*    BNP: Invalid input(s): POCBNP  CBG: No results for input(s): GLUCAP in the last 168 hours.  Microbiology: Results for orders placed or performed during the  hospital encounter of 07/06/15  MRSA PCR Screening     Status: Abnormal   Collection Time: 07/07/15 12:59 PM  Result Value Ref Range Status   MRSA by PCR POSITIVE (A) NEGATIVE Final    Comment:        The GeneXpert MRSA Assay (FDA approved for NASAL specimens only), is one component of a comprehensive MRSA colonization surveillance program. It is not intended to diagnose MRSA infection nor to guide or monitor treatment for MRSA infections. RESULT CALLED TO, READ BACK BY AND VERIFIED WITH: F. HASZ RN 15:30 07/07/15 ([email or web address])   Anaerobic culture     Status: None (Preliminary result)   Collection Time: 07/07/15  5:57 PM  Result Value Ref Range Status   Specimen Description CSF  Final   Special Requests NONE  Final   Gram Stain   Final    CYTOSPIN SMEAR WBC PRESENT, PREDOMINANTLY MONONUCLEAR NO ORGANISMS SEEN    Culture   Final    NO ANAEROBES ISOLATED; CULTURE IN PROGRESS FOR 5 DAYS   Report Status PENDING  Incomplete  CSF culture     Status:  None (Preliminary result)   Collection Time: 07/07/15  5:57 PM  Result Value Ref Range Status   Specimen Description CSF  Final   Special Requests NONE  Final   Gram Stain   Final    WBC PRESENT, PREDOMINANTLY MONONUCLEAR NO ORGANISMS SEEN CYTOSPIN    Culture NO GROWTH 2 DAYS  Final   Report Status PENDING  Incomplete  Culture, blood (routine x 2)     Status: None (Preliminary result)   Collection Time: 07/07/15  6:55 PM  Result Value Ref Range Status   Specimen Description BLOOD RIGHT FOREARM  Final   Special Requests BOTTLES DRAWN AEROBIC ONLY [place]  Final   Culture NO GROWTH 2 DAYS  Final   Report Status PENDING  Incomplete  Culture, blood (Routine X 2) w Reflex to ID Panel     Status: None (Preliminary result)   Collection Time: 07/07/15  9:25 PM  Result Value Ref Range Status   Specimen Description BLOOD RIGHT HAND  Final   Special Requests   Final    BOTTLES DRAWN AEROBIC AND ANAEROBIC BLUE 10CC RED [place]   Culture NO GROWTH 2 DAYS  Final   Report Status PENDING  Incomplete    Coagulation Studies:  Recent Labs  07/07/15 1201  LABPROT 14.4  INR 1.10    Urinalysis: No results for input(s): COLORURINE, LABSPEC, PHURINE, GLUCOSEU, HGBUR, BILIRUBINUR, KETONESUR, PROTEINUR, UROBILINOGEN, NITRITE, LEUKOCYTESUR in the last 72 hours.  Invalid input(s): APPERANCEUR    Imaging: Mr Brain Wo Contrast  07/08/2015  CLINICAL DATA:  Headache with nausea and vomiting. HIV, and ESRD, hypertension EXAM: MRI HEAD WITHOUT CONTRAST TECHNIQUE: Multiplanar, multiecho pulse sequences of the brain and surrounding structures were obtained without intravenous contrast. COMPARISON:  CT head 07/06/2015 FINDINGS: T2 hyperintensity in the basal ganglia bilaterally. This is in the caudate and putamen and is symmetric. The thalamus is spared. Brainstem and cerebellum normal. Cerebral white matter normal. No areas of restricted diffusion.  Negative for hemorrhage. Negative for mass or edema. No fluid  collection or shift of the midline structures. Paranasal sinuses clear. Pituitary and skull base normal. Normal orbit. IMPRESSION: Symmetric T2 hyperintensity in the caudate and putamen bilaterally without restricted diffusion. Given the history of HIV, HIV encephalopathy is a consideration. Metabolic or toxic encephalopathy possible. These lesions are not hyperintense on T1 as typically seen with hepatic encephalopathy or  hyperglycemia. Extra pontine myelinolysis is a consideration. Electronically Signed   By: Franchot Gallo M.D.   On: 07/08/2015 21:35     Medications:     . calcitRIOL  0.25 mcg Oral Q T,Th,Sa-HD  . calcium carbonate  200 mg of elemental calcium Oral TID WC  . carvedilol  12.5 mg Oral BID WC  . Chlorhexidine Gluconate Cloth  6 each Topical Q0600  . diltiazem  120 mg Oral Daily  . hydrALAZINE  25 mg Oral Q6H  .  HYDROmorphone (DILAUDID) injection  0.5 mg Intravenous Once  . meropenem (MERREM) IV  500 mg Intravenous Q24H  . multivitamin  1 tablet Oral QHS  . mupirocin ointment  1 application Nasal BID  . polyethylene glycol  17 g Oral Daily  . promethazine  12.5 mg Oral Q6H   Or  . prochlorperazine  5 mg Intravenous Q6H   Or  . promethazine  12.5 mg Rectal Q6H  . sodium chloride flush  3 mL Intravenous Q12H   acetaminophen, albuterol, HYDROmorphone (DILAUDID) injection, labetalol, polyvinyl alcohol, sodium chloride flush, vancomycin  Assessment/ Plan:  Jim Wyatt is a 35 y.o. 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 He went [facility]est ([facility]) after missing txs on 5/06 and 5/04 With HO multple Missed txs over past month. His L FA AVF infiltrated but used his R IJ Perm cath .Have used his AVF multiple times with plans to remove his perm cath This week as op  1. Headaches/ Blurry Vision/ HTN= per Admit Team wu= element of Uremia and htn playing some role in   2. ESRD - HD TTS use permcath with avf infiltration yest. Ice Pack to swollen AVF  3. Hypertension/volume - volume ok is below edw/ needs meds ?? Compliance as op  4. Anemia of ESRD - HGB 9.7 no eas at op start Aranesp in hosp/  5. Metabolic bone disease - Po vit d on hd , needs binders when eating 6. Thrombocytopenia= per admit team 7. HIV = per admit, noted he Has not taken ART since March and has been non-adherent in the past    LOS: 4 WEBB,MARTIN W   @TODAY @9 :14 AM  As Dw Dr.Webb Pt off HD OP schedule TTS/ Clear for dc from renal issues and   has spot for Monday 5//15/17  630 at [facility]  Then go back to Thursday / Sat. If dc next 24 hrs / I discussed with Pt in room today this schedule , he agrees . Ernest Haber PA-C

## 2015-07-10 NOTE — Discharge Summary (Signed)
Name: Jim Wyatt MRN: [id number] DOB: November 03, 1980 35 y.o. 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   Thrombocytopenia ([place])   Anemia due to end stage renal disease ([place])   ESRD on dialysis ([place])   Hypertensive urgency   Blurry vision, bilateral   AIDS (acquired immune deficiency syndrome) ([place])   Meningitis  Discharge Medications:   Medication List    STOP taking these medications        acetaminophen 500 MG tablet  Commonly known as:  TYLENOL     ondansetron 8 MG disintegrating tablet  Commonly known as:  ZOFRAN ODT      TAKE these medications        albuterol 108 (90 Base) MCG/ACT inhaler  Commonly known as:  PROVENTIL HFA;VENTOLIN HFA  Inhale 2 puffs into the lungs every 6 (six) hours as needed for wheezing or shortness of breath.     calcium carbonate 500 MG chewable tablet  Commonly known as:  TUMS - dosed in mg elemental calcium  Chew 1 tablet (200 mg of elemental calcium total) by mouth 3 (three) times daily with meals.     carvedilol 12.5 MG tablet  Commonly known as:  COREG  Take 12.5 mg by mouth 2 (two) times daily. Reported on 04/22/2015     dapsone 100 MG tablet  Take 1 tablet (100 mg total) by mouth daily.     darunavir-cobicistat 800-150 MG tablet  Commonly known as:  PREZCOBIX  Take 1 tablet by mouth daily. Swallow whole. Do NOT crush, break or chew tablets. Take with food.     diltiazem 180 MG 24 hr capsule  Commonly known as:  CARDIZEM CD  Take 1 capsule (180 mg total) by mouth daily.  Start taking on:  07/11/2015     dolutegravir 50 MG tablet  Commonly known as:  TIVICAY  Take 1 tablet (50 mg total) by  mouth daily.     hydrALAZINE 25 MG tablet  Commonly known as:  APRESOLINE  Take 1 tablet (25 mg total) by mouth 3 (three) times daily.     HYDROcodone-acetaminophen 5-325 MG tablet  Commonly known as:  NORCO  Take 1-2 tablets by mouth every 6 (six) hours as needed for moderate pain or severe pain.     multivitamin Tabs tablet  Take 1 tablet by mouth at bedtime.     promethazine 12.5 MG tablet  Commonly known as:  PHENERGAN  Take 1 tablet (12.5 mg total) by mouth every 6 (six) hours as needed for nausea or vomiting.     rilpivirine 25 MG Tabs tablet  Commonly known as:  EDURANT  Take 1 tablet (25 mg total) by mouth daily with breakfast.        Disposition and follow-up:   [name] was discharged from [facility] in Stable condition.    1.  At the hospital follow up visit please address:  - how is his nausea, headaches? - is he compliant with BP meds? - has he followed up with infectious disease to start back on ART? - is he going to dialysis?  2.  Labs / imaging needed at time of follow-up: none  3.  Pending labs/  test needing follow-up: none  Follow-up Appointments:     Follow-up Information    Schedule an appointment as soon as possible for a visit with [place].   Why:  If you do not hear from them by Tuesday, please call to make appointment.   Contact information:   [address] [place] [id number]      Schedule an appointment as soon as possible for a visit with [facility] for Infectious Disease.   Specialty:  Infectious Diseases   Why:  Call to make appointment with Dr. Johnnye Sima or Dr. Lequita Asal information:   [place], [place] [id number] [place] [phone]      Discharge Instructions:   Consultations: Treatment Team:  Edrick Oh, MD  Procedures Performed:  Ct Head Wo Contrast  07/06/2015   CLINICAL DATA:  Headache since last Tuesday. Pt also hypertensive Hx: HTN, Dialysis, HIV infection EXAM: CT HEAD WITHOUT CONTRAST TECHNIQUE: Contiguous axial images were obtained from the base of the skull through the vertex without intravenous contrast. COMPARISON:  04/10/2008 FINDINGS: Brain: No evidence of acute infarction, hemorrhage, extra-axial collection, ventriculomegaly, or mass effect. Vascular: No hyperdense vessel or unexpected calcification. Skull: Negative for fracture or focal lesion. Sinuses/Orbits: Mild mucoperiosteal thickening in visualized maxillary sinuses with changes of previous right maxillary antrectomy. Other: None. IMPRESSION: 1. Negative for bleed or other acute intracranial process. Electronically Signed   By: Lucrezia Europe M.D.   On: 07/06/2015 12:59   Mr Brain Wo Contrast  07/08/2015  CLINICAL DATA:  Headache with nausea and vomiting. HIV, and ESRD, hypertension EXAM: MRI HEAD WITHOUT CONTRAST TECHNIQUE: Multiplanar, multiecho pulse sequences of the brain and surrounding structures were obtained without intravenous contrast. COMPARISON:  CT head 07/06/2015 FINDINGS: T2 hyperintensity in the basal ganglia bilaterally. This is in the caudate and putamen and is symmetric. The thalamus is spared. Brainstem and cerebellum normal. Cerebral white matter normal. No areas of restricted diffusion.  Negative for hemorrhage. Negative for mass or edema. No fluid collection or shift of the midline structures. Paranasal sinuses clear. Pituitary and skull base normal. Normal orbit. IMPRESSION: Symmetric T2 hyperintensity in the caudate and putamen bilaterally without restricted diffusion. Given the history of HIV, HIV encephalopathy is a consideration. Metabolic or toxic encephalopathy possible. These lesions are not hyperintense on T1 as typically seen with hepatic encephalopathy or hyperglycemia. Extra pontine myelinolysis is a consideration. Electronically Signed   By: Franchot Gallo M.D.   On:  07/08/2015 21:35   Dg Chest Port 1 View  07/06/2015  CLINICAL DATA:  Dyspnea and shortness of breath for 1 day. On dialysis. EXAM: PORTABLE CHEST 1 VIEW COMPARISON:  02/27/2015 FINDINGS: Heart size remains at the upper limits of normal and stable. Right jugular dialysis catheter remains in appropriate position. Previously seen right upper lobe airspace disease has resolved since prior exam. Both lungs are clear. No evidence pneumothorax or pleural effusion. IMPRESSION: No active disease. Electronically Signed   By: Earle Gell M.D.   On: 07/06/2015 20:55   Dg Fluoro Guide Lumbar Puncture  07/07/2015  CLINICAL DATA:  HIV with fever, headache and blurred vision. EXAM: DIAGNOSTIC LUMBAR PUNCTURE UNDER FLUOROSCOPIC GUIDANCE FLUOROSCOPY TIME:  Radiation Exposure Index (as provided by the fluoroscopic device): 4.6 mGy If the device does not provide the exposure index: Fluoroscopy Time (in minutes and seconds):  0 minutes and 56 seconds Number of Acquired Images: PROCEDURE: Informed consent was obtained  from the patient prior to the procedure, including potential complications of headache, allergy, and pain. With the patient prone, the lower back was prepped with Betadine. 1% Lidocaine was used for local anesthesia. Lumbar puncture was performed at the L4-5 level using a 20 gauge needle with return of clear CSF. 10.5 ml of CSF were obtained for laboratory studies. The patient tolerated the procedure well and there were no apparent complications. IMPRESSION: Fluoroscopic guided lumbar puncture yielding 10.5 cc clear CSF for appropriate laboratory evaluation. Electronically Signed   By: Marijo Sanes M.D.   On: 07/07/2015 17:26     Admission HPI:  Jim Wyatt is a 35 y.o. 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 States his headaches began last Tuesday  and have progressively become worse. Described as throbbing, constant, located "down the middle of my brain". Rates pain currently and at worst a 7/10. Has taken Tylenol at home with no relief. He cannot think of anything that makes pain better. It is worsened by change of position. He does have a remote history of headaches but nothing of this intensity and unremitting duration. Associated symptoms also include blurry vision that began about 3 days ago which is entirely new to him. He denies any confusion, changes in mental status, stiff neck, fever, chills, myalgias, cough, rashes, diarrhea or constipation. He reports weight loss this week, fatigue, malaise, and mild abdominal pain. No recent sick contacts. He does report some gait instability that is not new for him. No recent falls. During the course of his illness, patient has developed nausea and vomiting that began same day as his headaches. He does have history of nausea and vomiting as noted during previous hospitalizations and reports nausea with many of his medications especially his HIV meds. He is reporting about 2 episodes of non-blood, non-bilious emesis per day with constant nausea. Vomiting is associated with oral intake but he reports no loss of appetite. This has resulted in him being unable to take any of his medications for the past week, including BP meds.   Patient went to dialysis today for the first time in 7 days due to his acute illness. While at dialysis, he developed pain and swelling at his left fistula site so they switched access to his right chest dialysis access. This was stopped shortly afterwards due to headache and blurred vision. He ultimately received about 1/2 his dialysis treatment today.  In the ED, patient was hypertensive and given labetalol and clonidine. He was also given anti-emetics. His symptoms did not really improve with this. CT was negative. EKG showed possible new T-wave inversions  that were new. Troponin was 0.04. CBC with WBC 4.4, Hgb 10.5, HCT 31, platelets 48. BMET showed K+ 3.9, creatine 8.9.  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)   Hypertensive urgency   Blurry vision, bilateral   AIDS (acquired immune deficiency syndrome) ([place])   Meningitis   Headache/blurry vision: Jim Wyatt was admitted to the internal medicine teaching service at [facility] on May 9th with one week of  headaches, blurry vision, nausea, and vomiting.  He presented following a dialysis appointment during which time he developed swelling around his AV fistula site and had increased headache pain.  Had not been to dialysis in one week and has not been taking HIV medications for the past two months.  He stated he had a constant headache for the past week that was not relieved with Tylenol.  Headache was associated with constant blurry vision in both eyes.   During admission he was hypertensive to the 160s with HR in the 80s.  Hypertension was poorly controlled with Coreg, Hydralazine, and Diltiazem during hospitalization.  Headache pain was controlled with Dilaudid and Tylenol.  He underwent LP and head MRI while admitted.  MRI showed "symmetric T2 hyperintensity in the caudate and putamen bilaterally without restricted diffusion", suggestive of HIV encephalopathy vs. metabolic or toxic encephalopathy.  LP did not show any specific findings and was negative for JC virus, HSV and Cryptococcus.  ID followed patient throughout hospitalization and put him on Vancomycin and Meropenem for two days to cover atypical meningitis until cultures came back.  Consulted with ophtho who did not feel it necessary to  consult patient because CMV result negative.  Workup was negative for opportunistic infection as cause of symptoms. By hospital day 5, pt stated his headache and blurry vision had subsided and he was ready for discharge.  Pt was discharged with a prescription for Norco (15 tablets) for headache pain.            Nausea/vomiting: Pt had nausea throughout hospitalization which improved with Phenergan.  He associated the nausea with his HIV medication.  Nausea had resolved by discharge on Day 5 and he was tolerating a regular diet.  He was discharged with a prescription for Phenergan to take as needed.    HIV : Pt had CD 4 count of 10 and viral load of 86,000 on admission.  Had been of HAART medications since March.  ID started patient back on his HAART therapy (Prezcobix, Tivicay, Edurant) on Hospital Day 3.  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  Plan to follow up as outpatient with infectious disease clinic and continue HAART therapy and dapsone prophylaxis.    CKD: Patient is a T/Th/S dialysis patient who is poorly compliant.  Missed one week of dialysis prior to admission.  He received dialysis during hospitalization which improved his creatinine from 11 during admission to 8 during discharge.  CKD may have contributed to headache.  Patient was educated on the importance of making his scheduled dialysis appointments and how important compliance is towards procuring a kidney transplant in the future.   HTN: Hypertension poorly controlled during hospitalization despite treatment with Coreg, Hydralazine, and Diltiazem. Systolics were in the [id number] during admission and trended down to the 140s upon discharge.  Patient was discharged with instructions to continue home medication (Coreg 12.5 mg), as well as Hydralazine 25 mg tid and Diltiazem 180mg  daily.    Discharge  Vitals:   BP 159/90 mmHg  Pulse 108  Temp(Src) 98.9 F (37.2 C) (Oral)  Resp 18  Ht 5\' 7"  (1.702 m)  Wt 149 lb 4 oz (67.7 kg)  BMI 23.37 kg/m2  SpO2 100%  Discharge Labs:  Results for orders placed or performed during the hospital encounter of 07/06/15 (from the past 24 hour(s))  Renal function panel  Status: Abnormal   Collection Time: 07/10/15  3:10 AM  Result Value Ref Range   Sodium 137 135 - 145 mmol/L   Potassium 3.6 3.5 - 5.1 mmol/L   Chloride 99 (L) 101 - 111 mmol/L   CO2 28 22 - 32 mmol/L   Glucose, Bld 114 (H) 65 - 99 mg/dL   BUN 22 (H) 6 - 20 mg/dL   Creatinine, Ser 7.99 (H) 0.61 - 1.24 mg/dL   Calcium 8.7 (L) 8.9 - 10.3 mg/dL   Phosphorus 4.3 2.5 - 4.6 mg/dL   Albumin 2.9 (L) 3.5 - 5.0 g/dL   GFR calc non Af Amer 8 (L) >60 mL/min   GFR calc Af Amer 9 (L) >60 mL/min   Anion gap 10 5 - 15  CBC     Status: Abnormal   Collection Time: 07/10/15  3:11 AM  Result Value Ref Range   WBC 3.1 (L) 4.0 - 10.5 K/uL   RBC 3.46 (L) 4.22 - 5.81 MIL/uL   Hemoglobin 9.7 (L) 13.0 - 17.0 g/dL   HCT 29.6 (L) 39.0 - 52.0 %   MCV 85.5 78.0 - 100.0 fL   MCH 28.0 26.0 - 34.0 pg   MCHC 32.8 30.0 - 36.0 g/dL   RDW 16.5 (H) 11.5 - 15.5 %   Platelets 75 (L) 150 - 400 K/uL    Signed: Jule Ser, DO 07/10/2015, 4:14 PM

## 2015-07-10 NOTE — Progress Notes (Signed)
Subjective: Patient seen and examined this morning.  Overnight, patient reports his headache is doing better, nausea is better with scheduled anti-emetics.  States that when he takes his ART he just vomits.  He does not want any more doses of vancomycin because it causes him to be anxious.  He received a one-time dose of Ativan last night.  He reports his vision is improved, but Left eye is still a little blurry.  He tolerated HD okay yesterday with no acute issues.  Reports he has not tried to eat solid food since we advanced his diet.  He is asking for his bed to be repositioned so that it is not in the sun.  Objective: Vital signs in last 24 hours: Filed Vitals:   07/09/15 2128 07/10/15 0016 07/10/15 0505 07/10/15 0832  BP: 147/83 148/74 144/89 154/94  Pulse: 88 107 96 105  Temp: 98.5 F (36.9 C) 98.6 F (37 C) 98.4 F (36.9 C) 98.7 F (37.1 C)  TempSrc: Oral Oral Oral Oral  Resp: 18 20 20 18   Height:      Weight:      SpO2: 100% 99% 98% 99%   Weight change: -7 lb 15.5 oz (-3.614 kg)  Intake/Output Summary (Last 24 hours) at 07/10/15 0848 Last data filed at 07/09/15 1900  Gross per 24 hour  Intake    240 ml  Output      0 ml  Net    240 ml   General: sleeping in bed, easily aroused, not distressed, withdrawn, soft spoken HEENT: EOMI, no scleral icterus Neck: full ROM Cardiac: mild tachycardia Pulm: normal effort Extremities: mild swelling at left fistula site Neuro: alert and oriented X3, cranial nerves II-XII grossly intact  Lab Results: Basic Metabolic Panel:  Recent Labs Lab 07/09/15 0342 07/10/15 0310  NA 139 137  K 4.1 3.6  CL 103 99*  CO2 21* 28  GLUCOSE 84 114*  BUN 62* 22*  CREATININE 13.84* 7.99*  CALCIUM 9.1 8.7*  PHOS 5.8* 4.3   Liver Function Tests:  Recent Labs Lab 07/06/15 1100  07/09/15 0342 07/10/15 0310  AST 11*  --   --   --   ALT 10*  --   --   --   ALKPHOS 41  --   --   --   BILITOT 1.5*  --   --   --   PROT 8.5*  --   --    --   ALBUMIN 3.1*  < > 3.1* 2.9*  < > = values in this interval not displayed.  Recent Labs Lab 07/06/15 1100  LIPASE 33   CBC:  Recent Labs Lab 07/07/15 0250  07/09/15 0342 07/10/15 0311  WBC 2.9*  < > 3.9* 3.1*  NEUTROABS 1.7  --   --   --   HGB 9.7*  < > 10.0* 9.7*  HCT 30.1*  < > 30.8* 29.6*  MCV 83.6  < > 84.4 85.5  PLT 54*  < > 69* 75*  < > = values in this interval not displayed. Cardiac Enzymes:  Recent Labs Lab 07/06/15 1425 07/06/15 2210 07/07/15 0250  TROPONINI 0.04* 0.04* 0.04*   Coagulation:  Recent Labs Lab 07/07/15 1201  LABPROT 14.4  INR 1.10   Urine Drug Screen: Drugs of Abuse     Component Value Date/Time   LABOPIA POSITIVE* 07/07/2015 0759   COCAINSCRNUR NONE DETECTED 07/07/2015 [place] DETECTED 07/07/2015 [place] DETECTED 07/07/2015 0759  THCU NONE DETECTED 07/07/2015 0759   LABBARB NONE DETECTED 07/07/2015 [id number]     Micro Results: Recent Results (from the past 240 hour(s))  MRSA PCR Screening     Status: Abnormal   Collection Time: 07/07/15 12:59 PM  Result Value Ref Range Status   MRSA by PCR POSITIVE (A) NEGATIVE Final    Comment:        The GeneXpert MRSA Assay (FDA approved for NASAL specimens only), is one component of a comprehensive MRSA colonization surveillance program. It is not intended to diagnose MRSA infection nor to guide or monitor treatment for MRSA infections. RESULT CALLED TO, READ BACK BY AND VERIFIED WITH: F. HASZ RN 15:30 07/07/15 ([email or web address])   Anaerobic culture     Status: None (Preliminary result)   Collection Time: 07/07/15  5:57 PM  Result Value Ref Range Status   Specimen Description CSF  Final   Special Requests NONE  Final   Gram Stain   Final    CYTOSPIN SMEAR WBC PRESENT, PREDOMINANTLY MONONUCLEAR NO ORGANISMS SEEN    Culture   Final    NO ANAEROBES ISOLATED; CULTURE IN PROGRESS FOR 5 DAYS   Report Status PENDING  Incomplete  CSF culture     Status: None  (Preliminary result)   Collection Time: 07/07/15  5:57 PM  Result Value Ref Range Status   Specimen Description CSF  Final   Special Requests NONE  Final   Gram Stain   Final    WBC PRESENT, PREDOMINANTLY MONONUCLEAR NO ORGANISMS SEEN CYTOSPIN    Culture NO GROWTH 2 DAYS  Final   Report Status PENDING  Incomplete  Culture, blood (routine x 2)     Status: None (Preliminary result)   Collection Time: 07/07/15  6:55 PM  Result Value Ref Range Status   Specimen Description BLOOD RIGHT FOREARM  Final   Special Requests BOTTLES DRAWN AEROBIC ONLY [place]  Final   Culture NO GROWTH 2 DAYS  Final   Report Status PENDING  Incomplete  Culture, blood (Routine X 2) w Reflex to ID Panel     Status: None (Preliminary result)   Collection Time: 07/07/15  9:25 PM  Result Value Ref Range Status   Specimen Description BLOOD RIGHT HAND  Final   Special Requests   Final    BOTTLES DRAWN AEROBIC AND ANAEROBIC BLUE 10CC RED [place]   Culture NO GROWTH 2 DAYS  Final   Report Status PENDING  Incomplete   Studies/Results: Mr Brain Wo Contrast  07/08/2015  CLINICAL DATA:  Headache with nausea and vomiting. HIV, and ESRD, hypertension EXAM: MRI HEAD WITHOUT CONTRAST TECHNIQUE: Multiplanar, multiecho pulse sequences of the brain and surrounding structures were obtained without intravenous contrast. COMPARISON:  CT head 07/06/2015 FINDINGS: T2 hyperintensity in the basal ganglia bilaterally. This is in the caudate and putamen and is symmetric. The thalamus is spared. Brainstem and cerebellum normal. Cerebral white matter normal. No areas of restricted diffusion.  Negative for hemorrhage. Negative for mass or edema. No fluid collection or shift of the midline structures. Paranasal sinuses clear. Pituitary and skull base normal. Normal orbit. IMPRESSION: Symmetric T2 hyperintensity in the caudate and putamen bilaterally without restricted diffusion. Given the history of HIV, HIV encephalopathy is a consideration. Metabolic  or toxic encephalopathy possible. These lesions are not hyperintense on T1 as typically seen with hepatic encephalopathy or hyperglycemia. Extra pontine myelinolysis is a consideration. Electronically Signed   By: Franchot Gallo M.D.   On: 07/08/2015 21:35   Medications: I  have reviewed the patient's current medications. Scheduled Meds: . calcitRIOL  0.25 mcg Oral Q T,Th,Sa-HD  . calcium carbonate  200 mg of elemental calcium Oral TID WC  . carvedilol  12.5 mg Oral BID WC  . Chlorhexidine Gluconate Cloth  6 each Topical Q0600  . diltiazem  120 mg Oral Daily  . hydrALAZINE  25 mg Oral Q6H  .  HYDROmorphone (DILAUDID) injection  0.5 mg Intravenous Once  . meropenem (MERREM) IV  500 mg Intravenous Q24H  . multivitamin  1 tablet Oral QHS  . mupirocin ointment  1 application Nasal BID  . polyethylene glycol  17 g Oral Daily  . promethazine  12.5 mg Oral Q6H   Or  . prochlorperazine  5 mg Intravenous Q6H   Or  . promethazine  12.5 mg Rectal Q6H  . 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  States headaches are better this AM.  Unsure of whether this may be due to blood pressure and non-adherence to HD.  MRI completed 5/11 with consideration for  HIV encephalopathy vs metabolic vs toxic encephalopathy based on symmetric T2 hyperintensity in the caudate and putamen bilaterally without restricted diffusion.  Given his profound immune compromise, infection still remains very much a concern. - appreciate infectious disease for following and their recommendations - ART held by ID due to nausea and vomiting - continue dilaudid prn, tylenol prn - continuing vancomycin and meropenem with concern for atypical bacterial meningitis presentation per ID.  Patient is reluctant to continue vancomycin but tried to explain to why we are treating him currently with antibiotics - also considering that this may be his HIV presenting as an aseptic meningitis - continuing blood pressure control as below - await further LP culture data.  Thus far, CSF gram stain with WBC present, no organisms.  Culture is pending.  HSV negative, crypto negative.  CMV and JC are pending.  Nausea & Vomiting: nausea better on scheduled anti-emetics.  No more vomiting but we are holding his ART.  Reports he has not eaten solid food yet since advancing his diet - renal diet - pherergan or compazine q6h scheduled  HTN: patient on Carvedilol 12.5mg  BID at home but has not taken for past week prior to admission - blood pressure is better controlled today.  Follow today after getting his diltiazem.  May increase diltiazem tomorrow - continue carvedilol 12.5mg  PO BID - continue hydralazine to 25mg  PO Q6H - continue diltiazem 120mg  PO daily - labetalol 10mg  IV Q2H prn for SBP > 160 or DBP >95  HIV Disease: patient has not been taking ART since March and has been non-adherent in the past. Supposed to be on rilpivirine, Tivicay, and Prezcobix. He also has not been taking his Dapsone for OI prophylaxis. - ID following - restarted his HIV medications 5/11, but stopped them  on 5/12 due to nausea/vomiting - CD4 10 - VL 86,000  ESRD: he is on Tues, Thurs, Sat dialysis. He has been  non-adherent recently with his HD so symptoms may be in part due to uremia as well - nephrology consult - HD yesterday  Non-Adherence to Treatment: Patient has been non-adherent to therapy for HIV, HTN, and HD.  Reports nausea and adverse effects to treatment as a large contributor but wonder if there is an underlying depression component - PHQ-9 given to him by [facility] and patient stated he would fill it out later - he reports his mood is good and he has no feelings of being down or depressed  Normocytic Anemia: Likely 2/2 to chronic disease - follow for now  Thrombocytopenia: chronic, no signs of obvious bleeding. Likely 2/2 to chronic disease state - follow for now  Diet: Renal  Code: Full  Dispo: Disposition is deferred at this time, awaiting improvement of current medical problems.  Anticipated discharge in approximately 2-3 day(s).   The patient does have a current PCP ([name], MD) and does not need an [facility] hospital follow-up appointment after discharge.  The patient does not have transportation limitations that hinder transportation to clinic appointments.  .Services Needed at time of discharge: Y = Yes, Blank = No PT:   OT:   RN:   Equipment:   Other:     LOS: 4 days   Jule Ser, DO 07/10/2015, 8:48 AM

## 2015-07-10 NOTE — Discharge Instructions (Signed)
Please go to dialysis on Monday morning.  Then follow your regular schedule thereafter.  Please make an appointment with Dr. Baxter Flattery or Dr. Johnnye Sima at Infectious Disease.  I have asked our [facility] to make an appointment for you.  If you do not hear from them by Tuesday, please call.  We are discharging you on 2 new blood pressure medications:  Diltiazem and Hydralazine.  Please take as prescribed.  We are discharging you with some anti-nausea medications and a short-course of pain medications.  While taking Norco, do not use Tylenol because Norco has Tylenol already in it.  I also sent in a refill for your albuterol inhaler.  Take care, Dr. Juleen China

## 2015-07-11 LAB — JC VIRUS, PCR CSF: JC Virus PCR, CSF: NEGATIVE

## 2015-07-11 LAB — CSF CULTURE W GRAM STAIN: Culture: NO GROWTH

## 2015-07-11 LAB — CSF CULTURE

## 2015-07-13 LAB — CULTURE, BLOOD (ROUTINE X 2)
Culture: NO GROWTH
Culture: NO GROWTH

## 2015-07-13 LAB — ANAEROBIC CULTURE

## 2015-08-05 LAB — FUNGUS CULTURE WITH STAIN

## 2015-08-05 LAB — FUNGAL ORGANISM REFLEX

## 2015-08-05 LAB — FUNGUS CULTURE RESULT

## 2015-08-20 LAB — ACID FAST CULTURE WITH REFLEXED SENSITIVITIES (MYCOBACTERIA): Acid Fast Culture: NEGATIVE

## 2015-08-30 ENCOUNTER — Ambulatory Visit (INDEPENDENT_AMBULATORY_CARE_PROVIDER_SITE_OTHER): Payer: BLUE CROSS/BLUE SHIELD | Admitting: Internal Medicine

## 2015-08-30 ENCOUNTER — Encounter: Payer: Self-pay | Admitting: Internal Medicine

## 2015-08-30 VITALS — BP 196/125 | HR 98 | Temp 98.6°F | Wt 141.2 lb

## 2015-08-30 DIAGNOSIS — B20 Human immunodeficiency virus [HIV] disease: Secondary | ICD-10-CM

## 2015-08-30 DIAGNOSIS — G43A0 Cyclical vomiting, not intractable: Secondary | ICD-10-CM

## 2015-08-30 DIAGNOSIS — I10 Essential (primary) hypertension: Secondary | ICD-10-CM

## 2015-08-30 DIAGNOSIS — Z9114 Patient's other noncompliance with medication regimen: Secondary | ICD-10-CM

## 2015-08-30 DIAGNOSIS — Z992 Dependence on renal dialysis: Secondary | ICD-10-CM

## 2015-08-30 DIAGNOSIS — R1115 Cyclical vomiting syndrome unrelated to migraine: Secondary | ICD-10-CM

## 2015-08-30 DIAGNOSIS — N186 End stage renal disease: Secondary | ICD-10-CM

## 2015-08-30 MED ORDER — METOCLOPRAMIDE HCL 5 MG PO TABS: 5.0000 mg | ORAL_TABLET | Freq: Four times a day (QID) | ORAL | Status: DC

## 2015-08-30 NOTE — Progress Notes (Signed)
Patient ID: Jim Wyatt, male   DOB: 03-31-1980, 35 y.o.   MRN: [id number]       Patient ID: Jim Wyatt, male   DOB: Mar 13, 1980, 35 y.o.   MRN: [id number]  HPI 35yo M with HIV disease, CD 4 count 10/VL 86,000 ( may 2017) admitted mid may for headache, meningitis ruled out. He states that his hiv medications often caused nausea with vomiting despite taking anti-emetics. He states he would split his hiv medications and take them through out the day with meals Taking medications irregularly, thus asked to stop taking hiv meds and readdress in clinic. He states that he gets n/v with hiv meds as well as other medications, including multivitamin. He does tolerate taking his BP meds, he does mention that they were recently changed 2 wk ago by dr sanford. Despite sbp in 200, he denies headache, chest pain, visual disturbance. He states he feels poorly when his bp in 150s. He states that he only makes it to HD roughly twice a week due to his work schedule for which he does not want to miss since it jeapordizes his employment/access to insurance.  He is not taking any oi proph again because of n/v.   Outpatient Encounter Prescriptions as of 08/30/2015  Medication Sig  . albuterol (PROVENTIL HFA;VENTOLIN HFA) 108 (90 Base) MCG/ACT inhaler Inhale 2 puffs into the lungs every 6 (six) hours as needed for wheezing or shortness of breath.  . calcium carbonate (TUMS - DOSED IN MG ELEMENTAL CALCIUM) 500 MG chewable tablet Chew 1 tablet (200 mg of elemental calcium total) by mouth 3 (three) times daily with meals.  . carvedilol (COREG) 12.5 MG tablet Take 12.5 mg by mouth 2 (two) times daily. Reported on 04/22/2015  . diltiazem (CARDIZEM CD) 180 MG 24 hr capsule Take 1 capsule (180 mg total) by mouth daily.  . hydrALAZINE (APRESOLINE) 25 MG tablet Take 1 tablet (25 mg total) by mouth 3 (three) times daily.  [name] HYDROcodone-acetaminophen (NORCO) 5-325 MG tablet Take 1-2 tablets by mouth every 6 (six) hours as  needed for moderate pain or severe pain.  . multivitamin (RENA-VIT) TABS tablet Take 1 tablet by mouth at bedtime.  . promethazine (PHENERGAN) 12.5 MG tablet Take 1 tablet (12.5 mg total) by mouth every 6 (six) hours as needed for nausea or vomiting.  . [DISCONTINUED] dapsone 100 MG tablet Take 1 tablet (100 mg total) by mouth daily. (Patient not taking: Reported on 07/06/2015)  . [DISCONTINUED] darunavir-cobicistat (PREZCOBIX) 800-150 MG tablet Take 1 tablet by mouth daily. Swallow whole. Do NOT crush, break or chew tablets. Take with food. (Patient not taking: Reported on 07/06/2015)  . [DISCONTINUED] dolutegravir (TIVICAY) 50 MG tablet Take 1 tablet (50 mg total) by mouth daily. (Patient not taking: Reported on 07/06/2015)  . [DISCONTINUED] rilpivirine (EDURANT) 25 MG TABS tablet Take 1 tablet (25 mg total) by mouth daily with breakfast. (Patient not taking: Reported on 07/06/2015)   No facility-administered encounter medications on file as of 08/30/2015.     Patient Active Problem List   Diagnosis Date Noted  . AIDS (acquired immune deficiency syndrome) ([place])   . Meningitis   . Cephalalgia   . Blurry vision, bilateral   . Headache   . Hypertensive urgency   . New onset of headache in immunocompromised patient ([place]) 07/06/2015  . ESRD on dialysis ([place]) 04/27/2015  . CAP (community acquired pneumonia) 02/25/2015  . End stage renal disease ([place]) 02/16/2015  . Swelling of right knee joint  01/12/2015  . History of DVT (deep vein thrombosis) 01/12/2015  . Anemia 01/12/2015  . Acute blood loss anemia   . Candidal esophagitis ([place])   . Constipation   . Nausea without vomiting 12/20/2014  . Abscess of right thigh 12/19/2014  . Chronic kidney disease (CKD), stage IV (severe) ([place]) 12/19/2014  . 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 Anemia due to end stage renal disease ([place]) 12/19/2014  . Hyponatremia 12/19/2014  . Community acquired pneumonia 07/30/2014  . Sepsis ([place]) 07/30/2014  . Metabolic  acidosis [id number]  . Anemia of chronic disease 07/30/2014  . Thrombocytopenia ([place]) 07/30/2014  . Acute on chronic renal failure ([place])   . 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 Human immunodeficiency virus (HIV) disease ([place]) 12/11/2013  . Hospital discharge follow-up 12/11/2013  . CMV (cytomegalovirus infection) ([place]) 12/07/2013  . Protein-calorie malnutrition, severe ([place]) 12/06/2013  . 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 DVT (deep venous thrombosis) ([place]) 11/24/2013  . Asthma, chronic 11/24/2013  . Acquired immune deficiency syndrome ([place]) 11/14/2013  . AKI (acute kidney injury) ([place]) 11/11/2013  . Elevated liver enzymes 11/09/2013  . History of shingles 11/09/2013  . Obesity 11/09/2013  . Pneumonia   . Asthma      Health Maintenance Due  Topic Date Due  . TETANUS/TDAP  01/21/2002     Review of Systems +fatigue, n/v/. Otherwise 10 point ros is negative Physical Exam   BP 196/125 mmHg  Pulse 98  Temp(Src) 98.6 F (37 C)  Wt 141 lb 4 oz (64.071 kg) Physical Exam  Constitutional: He is oriented to person, place, and time. He appears well-developed and well-nourished. No distress.  HENT:  Mouth/Throat: Oropharynx is clear and moist. No oropharyngeal exudate.  Cardiovascular: Normal rate, regular rhythm and normal heart sounds. Exam reveals no gallop and no friction rub.  No murmur heard.  Pulmonary/Chest: Effort normal and breath sounds normal. No respiratory distress. He has no wheezes.  Abdominal: Soft. Bowel sounds are normal. He exhibits no distension. There is no tenderness.  Lymphadenopathy:  He has no cervical adenopathy.  Ext: marked engorged vessels for HD on left arm Neurological: He is alert and oriented to person, place, and time.  Skin: Skin is warm and dry. No rash noted. No erythema.  Psychiatric: He has a normal mood and affect. His behavior is normal.     Lab Results  Component Value Date    CD4TCELL 3* 07/06/2015   Lab Results  Component Value Date   CD4TABS 10* 07/06/2015   CD4TABS 20* 04/22/2015   CD4TABS 50* 01/11/2015   Lab Results  Component Value Date   HIV1RNAQUANT 86000 07/06/2015   No results found for: HEPBSAB No results found for: RPR  CBC Lab Results  Component Value Date   WBC 3.1* 07/10/2015   RBC 3.46* 07/10/2015   HGB 9.7* 07/10/2015   HCT 29.6* 07/10/2015   PLT 75* 07/10/2015   MCV 85.5 07/10/2015   MCH 28.0 07/10/2015   MCHC 32.8 07/10/2015   RDW 16.5* 07/10/2015   LYMPHSABS 0.4* 07/07/2015   MONOABS 0.5 07/07/2015   EOSABS 0.3 07/07/2015   BASOSABS 0.0 07/07/2015   BMET Lab Results  Component Value Date   NA 137 07/10/2015   K 3.6 07/10/2015   CL 99* 07/10/2015   CO2 28 07/10/2015   GLUCOSE 114* 07/10/2015   BUN 22* 07/10/2015   CREATININE 7.99* 07/10/2015   CALCIUM 8.7* 07/10/2015   GFRNONAA 8* 07/10/2015   GFRAA 9* 07/10/2015  Assessment and Plan  hiv disease = poorly controlled. He has had a very challenging course of understanding the need to take hiv medication and now finding a regimen for which he can tolerate from the side effect profile. i also question the delivery for which he was taking his meds by splitting them for smaller size? And taking them throughout the day, which also at high risk for gaining resistance. He is adamant not taking them until we fix his nausea. i have impressed upon him that he should take his oi proph since his cd 4 count is 10.  Nausea = will try reglan and if it doesn't work we will refer to gi for evaluation to see if other diagnostics would be of value ? Gastric motility study though he does not having diabetes. His esrd is related to hypertensive nephropathy.   Uncontrolled htn = he states he hasn't taken his bp meds this afternoon. He states that this is his normal range. Again, mentioned he is at high risk of stroke with such elevated bp. Recommend to follow up with dr sanford for  further titration.  esrd on hd = recommended to try to make hd as scheduled as tiw instead of twice a week.  He has been a very difficult patient to get through to the importance of taking hiv medications. 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 Again, tried to reinforce that he can continue to work if he takes care of his health.  Will refer to our outreach team.

## 2015-09-20 ENCOUNTER — Emergency Department (HOSPITAL_COMMUNITY)
Admission: EM | Admit: 2015-09-20 | Discharge: 2015-09-20 | Disposition: A | Discharge status: Home / Self Care | Payer: BLUE CROSS/BLUE SHIELD | Attending: Internal Medicine | Admitting: Internal Medicine

## 2015-09-20 ENCOUNTER — Encounter (HOSPITAL_COMMUNITY): Payer: Self-pay | Admitting: Emergency Medicine

## 2015-09-20 DIAGNOSIS — I129 Hypertensive chronic kidney disease with stage 1 through stage 4 chronic kidney disease, or unspecified chronic kidney disease: Secondary | ICD-10-CM | POA: Insufficient documentation

## 2015-09-20 DIAGNOSIS — J45909 Unspecified asthma, uncomplicated: Secondary | ICD-10-CM | POA: Insufficient documentation

## 2015-09-20 DIAGNOSIS — Z5321 Procedure and treatment not carried out due to patient leaving prior to being seen by health care provider: Secondary | ICD-10-CM | POA: Insufficient documentation

## 2015-09-20 DIAGNOSIS — G43909 Migraine, unspecified, not intractable, without status migrainosus: Secondary | ICD-10-CM | POA: Diagnosis present

## 2015-09-20 DIAGNOSIS — N189 Chronic kidney disease, unspecified: Secondary | ICD-10-CM | POA: Diagnosis not present

## 2015-09-20 NOTE — ED Notes (Signed)
Pt called for A4 in waiting but no answer.

## 2015-09-20 NOTE — ED Triage Notes (Signed)
Patient reports headache onset last week , denies injury or fever . No emesis or blurred vision .

## 2015-09-20 NOTE — ED Notes (Signed)
Pt called for D31 by RN but no answer in waiting room.

## 2015-09-22 ENCOUNTER — Encounter: Payer: Self-pay | Admitting: Internal Medicine

## 2015-09-22 ENCOUNTER — Ambulatory Visit (INDEPENDENT_AMBULATORY_CARE_PROVIDER_SITE_OTHER): Payer: Medicare Other | Admitting: Internal Medicine

## 2015-09-22 VITALS — BP 171/108 | HR 99 | Temp 98.4°F | Ht 67.0 in | Wt 139.8 lb

## 2015-09-22 DIAGNOSIS — G44021 Chronic cluster headache, intractable: Secondary | ICD-10-CM | POA: Diagnosis not present

## 2015-09-22 MED ORDER — AMITRIPTYLINE HCL 10 MG PO TABS: 12.5000 mg | ORAL_TABLET | Freq: Every day | ORAL | Status: DC

## 2015-09-22 MED ORDER — DEXAMETHASONE 2 MG PO TABS: 4.0000 mg | ORAL_TABLET | Freq: Two times a day (BID) | ORAL | 0 refills | Status: DC

## 2015-09-22 NOTE — Progress Notes (Signed)
Patient ID: Jim Wyatt, male   DOB: Mar 30, 1980, 35 y.o.   MRN: [id number]       Patient ID: Jim Wyatt, male   DOB: 04-27-1980, 35 y.o.   MRN: [id number]  HPI 35yo M with hiv disease, CD 4 count of 10/VL 86K, starting back on his HIV regimen of tivicay/DRVc/ABC at least for the last week. Still having nausea though committed to taking meds every day. He states that he Just finished HD this morning post HD BP still in sbp 170s. Has bad headache plus insomnia. Takes tylenol frequently.often feels poorly during/after HD  Premed with zofran has helped his take his meds for hte past week, currently on tivicay, prezcobix, abacavir.  Outpatient Encounter Prescriptions as of 09/22/2015  Medication Sig  . albuterol (PROVENTIL HFA;VENTOLIN HFA) 108 (90 Base) MCG/ACT inhaler Inhale 2 puffs into the lungs every 6 (six) hours as needed for wheezing or shortness of breath.  . calcium carbonate (TUMS - DOSED IN MG ELEMENTAL CALCIUM) 500 MG chewable tablet Chew 1 tablet (200 mg of elemental calcium total) by mouth 3 (three) times daily with meals.  . carvedilol (COREG) 12.5 MG tablet Take 12.5 mg by mouth 2 (two) times daily. Reported on 04/22/2015  . diltiazem (CARDIZEM CD) 180 MG 24 hr capsule Take 1 capsule (180 mg total) by mouth daily.  . hydrALAZINE (APRESOLINE) 25 MG tablet Take 1 tablet (25 mg total) by mouth 3 (three) times daily.  [name] HYDROcodone-acetaminophen (NORCO) 5-325 MG tablet Take 1-2 tablets by mouth every 6 (six) hours as needed for moderate pain or severe pain. (Patient not taking: Reported on 09/22/2015)  . metoCLOPramide (REGLAN) 5 MG tablet Take 1 tablet (5 mg total) by mouth 4 (four) times daily.  . multivitamin (RENA-VIT) TABS tablet Take 1 tablet by mouth at bedtime. (Patient not taking: Reported on 09/22/2015)  . promethazine (PHENERGAN) 12.5 MG tablet Take 1 tablet (12.5 mg total) by mouth every 6 (six) hours as needed for nausea or vomiting. (Patient not taking: Reported on  09/22/2015)   No facility-administered encounter medications on file as of 09/22/2015.      Patient Active Problem List   Diagnosis Date Noted  . AIDS (acquired immune deficiency syndrome) ([place])   . Meningitis   . Cephalalgia   . Blurry vision, bilateral   . Headache   . Hypertensive urgency   . New onset of headache in immunocompromised patient ([place]) 07/06/2015  . ESRD on dialysis ([place]) 04/27/2015  . CAP (community acquired pneumonia) 02/25/2015  . End stage renal disease ([place]) 02/16/2015  . Swelling of right knee joint 01/12/2015  . History of DVT (deep vein thrombosis) 01/12/2015  . Anemia 01/12/2015  . Acute blood loss anemia   . Candidal esophagitis ([place])   . Constipation   . Nausea without vomiting 12/20/2014  . Abscess of right thigh 12/19/2014  . Chronic kidney disease (CKD), stage IV (severe) ([place]) 12/19/2014  . 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 Anemia due to end stage renal disease ([place]) 12/19/2014  . Hyponatremia 12/19/2014  . Community acquired pneumonia 07/30/2014  . Sepsis ([place]) 07/30/2014  . Metabolic acidosis 123456  . Anemia of chronic disease 07/30/2014  . Thrombocytopenia ([place]) 07/30/2014  . Acute on chronic renal failure ([place])   . 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 Human immunodeficiency virus (HIV) disease ([place]) 12/11/2013  . Hospital discharge follow-up 12/11/2013  . CMV (cytomegalovirus infection) ([place]) 12/07/2013  . Protein-calorie malnutrition, severe ([place]) 12/06/2013  . 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 DVT (deep venous thrombosis) ([place]) 11/24/2013  . Asthma, chronic 11/24/2013  . Acquired immune deficiency syndrome ([place]) 11/14/2013  . AKI (acute kidney injury) ([place]) 11/11/2013  . Elevated liver enzymes 11/09/2013  . History of shingles 11/09/2013  . Obesity 11/09/2013  . Pneumonia   . Asthma      Health Maintenance Due  Topic Date Due  . TETANUS/TDAP  01/21/2002      Review of Systems Headache, nausea, otherwise 10 point ros is negative Physical Exam   BP (!) 171/108 (BP Location: Right Arm, Patient Position: Sitting, Cuff Size: Normal)   Pulse 99   Temp 98.4 F (36.9 C) (Oral)   Ht 5\' 7"  (1.702 m)   Wt 139 lb 12.4 oz (63.4 kg)   BMI 21.89 kg/m  Physical Exam  Constitutional: He is oriented to person, place, and time. He appears well-developed and well-nourished. No distress.  HENT:  Mouth/Throat: Oropharynx is clear and moist. No oropharyngeal exudate.  Cardiovascular: Normal rate, regular rhythm and normal heart sounds. Exam reveals no gallop and no friction rub.  No murmur heard.  Pulmonary/Chest: Effort normal and breath sounds normal. No respiratory distress. He has no wheezes.  Abdominal: Soft. Bowel sounds are normal. He exhibits no distension. There is no tenderness.  Lymphadenopathy:  He has no cervical adenopathy.  Neurological: He is alert and oriented to person, place, and time.  Ext: left arm +thrill Skin: Skin is warm and dry. No rash noted. No erythema.  Psychiatric: He has a normal mood and affect. His behavior is normal.    Lab Results  Component Value Date   CD4TCELL 3 (L) 07/06/2015   Lab Results  Component Value Date   CD4TABS 10 (L) 07/06/2015   CD4TABS 20 (L) 04/22/2015   CD4TABS 50 (L) 01/11/2015   Lab Results  Component Value Date   HIV1RNAQUANT 86,000 07/06/2015   No results found for: HEPBSAB No results found for: RPR  CBC Lab Results  Component Value Date   WBC 3.1 (L) 07/10/2015   RBC 3.46 (L) 07/10/2015   HGB 9.7 (L) 07/10/2015   HCT 29.6 (L) 07/10/2015   PLT 75 (L) 07/10/2015   MCV 85.5 07/10/2015   MCH 28.0 07/10/2015   MCHC 32.8 07/10/2015   RDW 16.5 (H) 07/10/2015   LYMPHSABS 0.4 (L) 07/07/2015   MONOABS 0.5 07/07/2015   EOSABS 0.3 07/07/2015   BASOSABS 0.0 07/07/2015   BMET Lab Results  Component Value Date   NA 137 07/10/2015   K 3.6 07/10/2015   CL 99 (L) 07/10/2015   CO2  28 07/10/2015   GLUCOSE 114 (H) 07/10/2015   BUN 22 (H) 07/10/2015   CREATININE 7.99 (H) 07/10/2015   CALCIUM 8.7 (L) 07/10/2015   GFRNONAA 8 (L) 07/10/2015   GFRAA 9 (L) 07/10/2015     Assessment and Plan  HIV disease= advanced hiv disease, and has been difficult to convince to take meds regularly for several years, though he appears that he is willing to try to take current regimen.  Rebound headache? = likely due to acute fluctuation during hemodialysis. He does states that he feels that he he has chronic headaches, regardless of HD days as well. will give him dex taper for rescue then also give amytriptiline qhs to see if it helps with chronic headache  htn = poorly controlled. Reinforced to make sure he takes his HTN meds. Will defer to dr sanford to see if needs further adjustment  hiv oi proph = ideally he also needs to be on MAC proph with azithromycin that he refuses to take because of nausea, as well as should be on daily bactirm for pcp proph.   rtc in 3 wk for blood work and see how ha are doing

## 2015-09-23 ENCOUNTER — Telehealth: Payer: Self-pay

## 2015-09-23 NOTE — Telephone Encounter (Signed)
Called patient to see what time he could come in on Monday August 14th in the morning per Dr. Baxter Flattery. Dr.Snider is willing to see him that day due to his dialysis treatment being on Tuesdays, Thursdays, and Saturdays. Left voice mail to call back to schedule appointment. Rodman Key, LPN

## 2015-10-06 ENCOUNTER — Ambulatory Visit (INDEPENDENT_AMBULATORY_CARE_PROVIDER_SITE_OTHER): Payer: Self-pay | Admitting: Internal Medicine

## 2015-10-06 ENCOUNTER — Encounter: Payer: Self-pay | Admitting: Internal Medicine

## 2015-10-06 VITALS — BP 189/124 | HR 85 | Temp 98.0°F | Wt 167.8 lb

## 2015-10-06 DIAGNOSIS — Z992 Dependence on renal dialysis: Secondary | ICD-10-CM

## 2015-10-06 DIAGNOSIS — R110 Nausea: Secondary | ICD-10-CM

## 2015-10-06 DIAGNOSIS — N186 End stage renal disease: Secondary | ICD-10-CM

## 2015-10-06 DIAGNOSIS — B20 Human immunodeficiency virus [HIV] disease: Secondary | ICD-10-CM

## 2015-10-06 DIAGNOSIS — I10 Essential (primary) hypertension: Secondary | ICD-10-CM

## 2015-10-06 MED ORDER — ONDANSETRON 8 MG PO TBDP: 8.0000 mg | ORAL_TABLET | Freq: Three times a day (TID) | ORAL | 11 refills | Status: DC | PRN

## 2015-10-06 MED ORDER — DOLUTEGRAVIR SODIUM 50 MG PO TABS: 50.0000 mg | ORAL_TABLET | Freq: Every day | ORAL | 11 refills | Status: DC

## 2015-10-06 MED ORDER — DARUNAVIR-COBICISTAT 800-150 MG PO TABS: 1.0000 | ORAL_TABLET | Freq: Every day | ORAL | 11 refills | Status: DC

## 2015-10-06 MED ORDER — ABACAVIR SULFATE 300 MG PO TABS: 600.0000 mg | ORAL_TABLET | Freq: Every day | ORAL | 11 refills | Status: DC

## 2015-10-06 NOTE — Progress Notes (Signed)
Patient ID: Jim Wyatt, male   DOB: Oct 13, 1980, 35 y.o.   MRN: [id number]  HPI Jim Wyatt is a 35yo M with advanced HIV disease, CD 4 count of 10/VL 86,000. He has started taking his medications regularly without missing a dose in the last 2 weeks. He states that the premedication with zofran has helped him with keeping his HIV medications without vomiting.  He states that he feels better from that standpoint  He still subscribes to post HD headaches. He continues to take his HTN medications as directed  Outpatient Encounter Prescriptions as of 10/06/2015  Medication Sig  . albuterol (PROVENTIL HFA;VENTOLIN HFA) 108 (90 Base) MCG/ACT inhaler Inhale 2 puffs into the lungs every 6 (six) hours as needed for wheezing or shortness of breath.  . calcium carbonate (TUMS - DOSED IN MG ELEMENTAL CALCIUM) 500 MG chewable tablet Chew 1 tablet (200 mg of elemental calcium total) by mouth 3 (three) times daily with meals.  . carvedilol (COREG) 12.5 MG tablet Take 12.5 mg by mouth 2 (two) times daily. Reported on 04/22/2015  . dexamethasone (DECADRON) 2 MG tablet Take 2 tablets (4 mg total) by mouth 2 (two) times daily. X 4 days then 1 tab twice a day x 4 days  . diltiazem (CARDIZEM CD) 180 MG 24 hr capsule Take 1 capsule (180 mg total) by mouth daily.  . hydrALAZINE (APRESOLINE) 25 MG tablet Take 1 tablet (25 mg total) by mouth 3 (three) times daily.  [name] HYDROcodone-acetaminophen (NORCO) 5-325 MG tablet Take 1-2 tablets by mouth every 6 (six) hours as needed for moderate pain or severe pain.  [name] metoCLOPramide (REGLAN) 5 MG tablet Take 1 tablet (5 mg total) by mouth 4 (four) times daily.  . multivitamin (RENA-VIT) TABS tablet Take 1 tablet by mouth at bedtime.  . promethazine (PHENERGAN) 12.5 MG tablet Take 1 tablet (12.5 mg total) by mouth every 6 (six) hours as needed for nausea or vomiting.   Facility-Administered Encounter Medications as of 10/06/2015  Medication  . amitriptyline (ELAVIL) tablet 15  mg     Patient Active Problem List   Diagnosis Date Noted  . AIDS (acquired immune deficiency syndrome) ([place])   . Meningitis   . Cephalalgia   . Blurry vision, bilateral   . Headache   . Hypertensive urgency   . New onset of headache in immunocompromised patient ([place]) 07/06/2015  . ESRD on dialysis ([place]) 04/27/2015  . CAP (community acquired pneumonia) 02/25/2015  . End stage renal disease ([place]) 02/16/2015  . Swelling of right knee joint 01/12/2015  . History of DVT (deep vein thrombosis) 01/12/2015  . Anemia 01/12/2015  . Acute blood loss anemia   . Candidal esophagitis ([place])   . Constipation   . Nausea without vomiting 12/20/2014  . Abscess of right thigh 12/19/2014  . Chronic kidney disease (CKD), stage IV (severe) ([place]) 12/19/2014  . 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 Anemia due to end stage renal disease ([place]) 12/19/2014  . Hyponatremia 12/19/2014  . Community acquired pneumonia 07/30/2014  . Sepsis ([place]) 07/30/2014  . Metabolic acidosis 123456  . Anemia of chronic disease 07/30/2014  . Thrombocytopenia ([place]) 07/30/2014  . Acute on chronic renal failure ([place])   . 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 Human immunodeficiency virus (HIV) disease ([place]) 12/11/2013  . Hospital discharge follow-up 12/11/2013  . CMV (cytomegalovirus infection) ([place]) 12/07/2013  . Protein-calorie malnutrition, severe ([place]) 12/06/2013  . 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 DVT (deep  venous thrombosis) ([place]) 11/24/2013  . Asthma, chronic 11/24/2013  . Acquired immune deficiency syndrome ([place]) 11/14/2013  . AKI (acute kidney injury) ([place]) 11/11/2013  . Elevated liver enzymes 11/09/2013  . History of shingles 11/09/2013  . Obesity 11/09/2013  . Pneumonia   . Asthma      Health Maintenance Due  Topic Date Due  . TETANUS/TDAP  01/21/2002  . INFLUENZA VACCINE  09/28/2015     Review of Systems Denies headache. Has  hemodialysis. Has not taken his BP meds this morning  Physical Exam   BP (!) 189/124   Pulse 85   Temp 98 F (36.7 C) (Oral)   Wt 167 lb 12.8 oz (76.1 kg)   SpO2 100%   BMI 26.28 kg/m   Constitutional: He is oriented to person, place, and time. He appears well-developed and well-nourished. No distress.  HENT:  Mouth/Throat: Oropharynx is clear and moist. No oropharyngeal exudate.  Ext: left arm has tortuous veins from dialysis site Neurological: He is alert and oriented to person, place, and time.  Skin: Skin is warm and dry. No rash noted. No erythema.  Psychiatric: He has a normal mood and affect. His behavior is normal.    Lab Results  Component Value Date   CD4TCELL 3 (L) 07/06/2015   Lab Results  Component Value Date   CD4TABS 10 (L) 07/06/2015   CD4TABS 20 (L) 04/22/2015   CD4TABS 50 (L) 01/11/2015   Lab Results  Component Value Date   HIV1RNAQUANT 86,000 07/06/2015   CBC Lab Results  Component Value Date   WBC 3.1 (L) 07/10/2015   RBC 3.46 (L) 07/10/2015   HGB 9.7 (L) 07/10/2015   HCT 29.6 (L) 07/10/2015   PLT 75 (L) 07/10/2015   MCV 85.5 07/10/2015   MCH 28.0 07/10/2015   MCHC 32.8 07/10/2015   RDW 16.5 (H) 07/10/2015   LYMPHSABS 0.4 (L) 07/07/2015   MONOABS 0.5 07/07/2015   EOSABS 0.3 07/07/2015   BASOSABS 0.0 07/07/2015   BMET Lab Results  Component Value Date   NA 137 07/10/2015   K 3.6 07/10/2015   CL 99 (L) 07/10/2015   CO2 28 07/10/2015   GLUCOSE 114 (H) 07/10/2015   BUN 22 (H) 07/10/2015   CREATININE 7.99 (H) 07/10/2015   CALCIUM 8.7 (L) 07/10/2015   GFRNONAA 8 (L) 07/10/2015   GFRAA 9 (L) 07/10/2015    Assessment and Plan  HIV disease = patient appears to be tolerating his regimen with anti-emetic premedication. This visit was to establish that he can take meds routinely. We will have him come back in 4 wk for repeat labs and visit in 6 wk. Pharmacy did further adherence counseling with the patient. Patient was able to recognize  which medicines he takes routinely. We will reorder hiv meds plus zofran.  At next visit, will try to have him restart oi proph  Headache = he appears to have it mostly post HD. Will reach out to dr [name] to see if would it is all due to fluctuating BP vs. Doing chronic treatment with amitryptiline, low dose.  Poorly controlled HTN = the patient has known elevated BP likely one of cause of his ESRD. The patient is taking his meds. Will defer to nephrology to see if any changes needed. Elevated today, but he denies symptoms, and has not taking his meds yet this morning.  Nausea = refill zofran  rtc in 6 wk

## 2015-10-06 NOTE — Progress Notes (Signed)
Patient ID: Jim Wyatt, male   DOB: 13-Feb-1981, 35 y.o.   MRN: [id number]  HPI: Jim Wyatt is a 35 y.o. male who presents for HIV follow-up.  Allergies: Allergies  Allergen Reactions  . Bactrim [Sulfamethoxazole-Trimethoprim] Other (See Comments)    Renal failure  . Cauliflower [Brassica Oleracea Italica] Anaphylaxis and Other (See Comments)    Pt states his throat closes up  . Vancomycin Other (See Comments)    Patient states vancomycin caused kidney injury  . Amlodipine Palpitations    fatigue  . Augmentin [Amoxicillin-Pot Clavulanate] Other (See Comments)    unknown  . Banana Other (See Comments)  . Fish Allergy Hives  . Onion Rash    Vitals: Temp: 98 F (36.7 C) (08/09 1121) Temp Source: Oral (08/09 1121) BP: 189/124 (08/09 1121) Pulse Rate: 85 (08/09 1121)  Past Medical History: Past Medical History:  Diagnosis Date  . Asthma   . CAP (community acquired pneumonia) 10/2013  . Cellulitis 11/2013   LLE  . Chronic kidney disease   . DVT (deep venous thrombosis) ([place]) 10/2013   BLE  . Fever   . Headache   . HIV infection ([place]) dx'd 10/2013  . Hypertension   . Leg pain   . Multiple environmental allergies    "trees, dogs, cats"  . Multiple food allergies   . Shingles < 2010  . 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 Marital status: Single    Spouse name: N/A  . Number of children: N/A  . Years of education: N/A   Social History Main Topics  . Smoking status: Never Smoker  . Smokeless tobacco: Never Used  . Alcohol use Yes     Comment: I stopped drinking along time ago "  . Drug use: No  . Sexual activity: Not Asked   Other Topics Concern  . None   Social History Narrative  . None    Current Regimen: Ziagen 600 mg daily Prezcobix 800-150 mg daily Tivicay 50 mg daily  Labs: HIV 1 RNA Quant (copies/mL)  Date Value  07/06/2015 86,000  04/22/2015 83,181 (H)  01/11/2015 114,123  (H)   CD4 T Cell Abs (/uL)  Date Value  07/06/2015 10 (L)  04/22/2015 20 (L)  01/11/2015 50 (L)   Hepatitis B Surface Ag (no units)  Date Value  11/10/2013 NEGATIVE   HCV Ab (no units)  Date Value  11/10/2013 NEGATIVE   Lipids:    Component Value Date/Time   CHOL 100 11/15/2013 0820   TRIG 327 (H) 11/15/2013 0820   HDL 8 (L) 11/15/2013 0820   CHOLHDL 12.5 11/15/2013 0820   VLDL 65 (H) 11/15/2013 0820   LDLCALC 27 11/15/2013 0820    Assessment: Jim Wyatt was able to point out the medications he was taking: Prezcobix, abacavir, and Tivicay. He reports being more compliant, maybe missing 1 or 2 doses since last visit. He also reports nausea has improved after being prescribed Zofran. Confirmed that he receives HD T/Th/S which is why he is on his current HIV regimen. He claims he doesn't eat consistent meals and works at night (until 11pm, sometimes 2am), so he often takes his medications on an empty stomach. Explained to him that Prezcobix needs to be taken with food in order to be absorbed, and taking with food could help more with nausea.  Recommendations: -Continue current HIV regimen -Continue Zofran prn -Recommended eating a meal ([place] per [place]) after work before  taking meds  -F/u with Dr. Baxter Flattery in 6 weeks   Gwenlyn Perking, PharmD PGY1 Pharmacy Resident Pager: [phone] 10/06/2015 12:09 PM

## 2015-11-05 ENCOUNTER — Encounter (HOSPITAL_COMMUNITY): Payer: Self-pay | Admitting: Emergency Medicine

## 2015-11-05 ENCOUNTER — Emergency Department (HOSPITAL_COMMUNITY): Payer: Self-pay

## 2015-11-05 ENCOUNTER — Emergency Department (HOSPITAL_COMMUNITY)
Admission: EM | Admit: 2015-11-05 | Discharge: 2015-11-05 | Disposition: A | Discharge status: Home / Self Care | Payer: Self-pay | Attending: Emergency Medicine | Admitting: Emergency Medicine

## 2015-11-05 DIAGNOSIS — Z992 Dependence on renal dialysis: Secondary | ICD-10-CM | POA: Insufficient documentation

## 2015-11-05 DIAGNOSIS — G939 Disorder of brain, unspecified: Secondary | ICD-10-CM

## 2015-11-05 DIAGNOSIS — N186 End stage renal disease: Secondary | ICD-10-CM | POA: Insufficient documentation

## 2015-11-05 DIAGNOSIS — I120 Hypertensive chronic kidney disease with stage 5 chronic kidney disease or end stage renal disease: Secondary | ICD-10-CM | POA: Insufficient documentation

## 2015-11-05 DIAGNOSIS — R569 Unspecified convulsions: Secondary | ICD-10-CM | POA: Insufficient documentation

## 2015-11-05 DIAGNOSIS — Z79899 Other long term (current) drug therapy: Secondary | ICD-10-CM | POA: Insufficient documentation

## 2015-11-05 DIAGNOSIS — J45909 Unspecified asthma, uncomplicated: Secondary | ICD-10-CM | POA: Insufficient documentation

## 2015-11-05 LAB — COMPREHENSIVE METABOLIC PANEL
ALT: 11 U/L — ABNORMAL LOW (ref 17–63)
AST: 23 U/L (ref 15–41)
Albumin: 3.5 g/dL (ref 3.5–5.0)
Alkaline Phosphatase: 49 U/L (ref 38–126)
Anion gap: 25 — ABNORMAL HIGH (ref 5–15)
BUN: 60 mg/dL — ABNORMAL HIGH (ref 6–20)
CO2: 12 mmol/L — ABNORMAL LOW (ref 22–32)
Calcium: 8.6 mg/dL — ABNORMAL LOW (ref 8.9–10.3)
Chloride: 102 mmol/L (ref 101–111)
Creatinine, Ser: 16.44 mg/dL — ABNORMAL HIGH (ref 0.61–1.24)
GFR calc Af Amer: 4 mL/min — ABNORMAL LOW (ref >60)
GFR calc non Af Amer: 3 mL/min — ABNORMAL LOW (ref >60)
Glucose, Bld: 125 mg/dL — ABNORMAL HIGH (ref 65–99)
Potassium: 4.4 mmol/L (ref 3.5–5.1)
Sodium: 139 mmol/L (ref 135–145)
Total Bilirubin: 0.7 mg/dL (ref 0.3–1.2)
Total Protein: 8 g/dL (ref 6.5–8.1)

## 2015-11-05 LAB — URINALYSIS, ROUTINE W REFLEX MICROSCOPIC
Bilirubin Urine: NEGATIVE
Glucose, UA: 100 mg/dL — AB
Ketones, ur: NEGATIVE mg/dL
Leukocytes, UA: NEGATIVE
Nitrite: NEGATIVE
Protein, ur: >300 mg/dL — AB
Specific Gravity, Urine: 1.013 (ref 1.005–1.030)
pH: 8 (ref 5.0–8.0)

## 2015-11-05 LAB — CBC WITH DIFFERENTIAL/PLATELET
Basophils Absolute: 0.1 10*3/uL (ref 0.0–0.1)
Basophils Relative: 1 %
Eosinophils Absolute: 0.4 10*3/uL (ref 0.0–0.7)
Eosinophils Relative: 6 %
HCT: 33.9 % — ABNORMAL LOW (ref 39.0–52.0)
Hemoglobin: 10.8 g/dL — ABNORMAL LOW (ref 13.0–17.0)
Lymphocytes Relative: 40 %
Lymphs Abs: 2.5 10*3/uL (ref 0.7–4.0)
MCH: 29.2 pg (ref 26.0–34.0)
MCHC: 31.9 g/dL (ref 30.0–36.0)
MCV: 91.6 fL (ref 78.0–100.0)
Monocytes Absolute: 0.9 10*3/uL (ref 0.1–1.0)
Monocytes Relative: 14 %
Neutro Abs: 2.4 10*3/uL (ref 1.7–7.7)
Neutrophils Relative %: 39 %
Platelets: 116 10*3/uL — ABNORMAL LOW (ref 150–400)
RBC: 3.7 MIL/uL — ABNORMAL LOW (ref 4.22–5.81)
RDW: 15.7 % — ABNORMAL HIGH (ref 11.5–15.5)
WBC: 6.3 10*3/uL (ref 4.0–10.5)

## 2015-11-05 LAB — CSF CELL COUNT WITH DIFFERENTIAL
RBC Count, CSF: 0 /mm3
RBC Count, CSF: 460 /mm3 — ABNORMAL HIGH
Tube #: 1
Tube #: 4
WBC, CSF: 1 /mm3 (ref 0–5)
WBC, CSF: 1 /mm3 (ref 0–5)

## 2015-11-05 LAB — RAPID URINE DRUG SCREEN, HOSP PERFORMED
Amphetamines: NOT DETECTED
Barbiturates: NOT DETECTED
Benzodiazepines: NOT DETECTED
Cocaine: NOT DETECTED
Opiates: POSITIVE — AB
Tetrahydrocannabinol: NOT DETECTED

## 2015-11-05 LAB — GLUCOSE, CSF: Glucose, CSF: 58 mg/dL (ref 40–70)

## 2015-11-05 LAB — URINE MICROSCOPIC-ADD ON

## 2015-11-05 LAB — T-HELPER CELLS (CD4) COUNT (NOT AT ARMC)
CD4 % Helper T Cell: 2 % — ABNORMAL LOW (ref 33–55)
CD4 T Cell Abs: 20 /uL — ABNORMAL LOW (ref 400–2700)

## 2015-11-05 LAB — CRYPTOCOCCAL ANTIGEN, CSF: Crypto Ag: NEGATIVE

## 2015-11-05 LAB — PROTEIN, CSF: Total  Protein, CSF: 81 mg/dL — ABNORMAL HIGH (ref 15–45)

## 2015-11-05 LAB — ETHANOL: Alcohol, Ethyl (B): <5 mg/dL (ref <5)

## 2015-11-05 MED ORDER — LEVETIRACETAM 500 MG PO TABS: 500.0000 mg | ORAL_TABLET | ORAL | 0 refills | Status: DC

## 2015-11-05 MED ORDER — LIDOCAINE HCL (PF) 1 % IJ SOLN
5.0000 mL | Freq: Once | INTRAMUSCULAR | Status: AC
  Administered 2015-11-05: 5 mL
  Filled 2015-11-05: qty 5

## 2015-11-05 MED ORDER — ONDANSETRON 4 MG PO TBDP
8.0000 mg | ORAL_TABLET | Freq: Once | ORAL | Status: AC
  Administered 2015-11-05: 8 mg via ORAL
  Filled 2015-11-05: qty 2

## 2015-11-05 MED ORDER — LEVETIRACETAM 500 MG PO TABS: 500.0000 mg | ORAL_TABLET | Freq: Once | ORAL | 0 refills | Status: DC

## 2015-11-05 MED ORDER — IOPAMIDOL (ISOVUE-300) INJECTION 61%
INTRAVENOUS | Status: AC
  Administered 2015-11-05: 75 mL via INTRAVENOUS
  Filled 2015-11-05: qty 75

## 2015-11-05 MED ORDER — SODIUM CHLORIDE 0.9 % IV SOLN
500.0000 mg | Freq: Once | INTRAVENOUS | Status: AC
  Administered 2015-11-05: 500 mg via INTRAVENOUS
  Filled 2015-11-05: qty 5

## 2015-11-05 MED ORDER — LEVETIRACETAM 250 MG PO TABS: 250.0000 mg | ORAL_TABLET | ORAL | 0 refills | Status: DC

## 2015-11-05 MED ORDER — LIDOCAINE HCL (PF) 1 % IJ SOLN
5.0000 mL | Freq: Once | INTRAMUSCULAR | Status: DC
  Filled 2015-11-05: qty 5

## 2015-11-05 MED ORDER — LORAZEPAM 2 MG/ML IJ SOLN
INTRAMUSCULAR | Status: AC
  Administered 2015-11-05: 2 mg
  Filled 2015-11-05: qty 1

## 2015-11-05 NOTE — ED Provider Notes (Signed)
[facility] DEPT Provider Note   CSN: [id number] Arrival date & time: 11/05/15  0524     History   Chief Complaint Chief Complaint  Patient presents with  . Seizures    HPI Jim Wyatt is a 35 y.o. male.  Pt is a 35 year old male past medical history of uncontrolled HIV, HTN and CKD (on hemodialysis) who presents the ED with complaint of "jerking", onset yesterday afternoon. Patient reports starting yesterday afternoon he began to have intermittent jerking/shaking of his arms. He reports his arms would continuously jerk his arms every few minutes. Denies any aggravating symptoms. Patient reports his symptoms improved when he was in the his car with the heat lasting on his way to the ED. Denies history of similar symptoms in the past. Patient denies any other pain or complaints at this time. He notes he goes to hemodialysis approximately 1-2 times per week and states he did not go to his scheduled treatment yesterday. Patient reports he does not go to 3 times a week due to dialysis making him feel bad. Patient denies history of seizures. He reports he has been taking all his home medications as prescribed. Denies fever, chills, headache, lightheadedness, dizziness, visual changes, cough, shortness of breath, chest pain, abdominal pain, nausea, vomiting, diarrhea, urinary sxs, numbness, tingling, weakness, syncope, fall, head injury. Patient denies any recent changes in his medications.      Past Medical History:  Diagnosis Date  . Asthma   . CAP (community acquired pneumonia) 10/2013  . Cellulitis 11/2013   LLE  . Chronic kidney disease   . DVT (deep venous thrombosis) ([place]) 10/2013   BLE  . Fever   . Headache   . HIV infection ([place]) dx'd 10/2013  . Hypertension   . Leg pain   . Multiple environmental allergies    "trees, dogs, cats"  . Multiple food allergies   . Shingles < 2010  . 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 AIDS (acquired immune deficiency syndrome) ([place])   . Meningitis   . Cephalalgia   . Blurry vision, bilateral   . Headache   . Hypertensive urgency   . New onset of headache in immunocompromised patient ([place]) 07/06/2015  . ESRD on dialysis ([place]) 04/27/2015  . CAP (community acquired pneumonia) 02/25/2015  . End stage renal disease ([place]) 02/16/2015  . Swelling of right knee joint 01/12/2015  . History of DVT (deep vein thrombosis) 01/12/2015  . Anemia 01/12/2015  . Acute blood loss anemia   . Candidal esophagitis ([place])   . Constipation   . Nausea without vomiting 12/20/2014  . Abscess of right thigh 12/19/2014  . Chronic kidney disease (CKD), stage IV (severe) ([place]) 12/19/2014  . 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 Anemia due to end stage renal disease ([place]) 12/19/2014  . Hyponatremia 12/19/2014  . Community acquired pneumonia 07/30/2014  . Sepsis ([place]) 07/30/2014  . Metabolic acidosis 50/27/7412  . Anemia of chronic disease 07/30/2014  . Thrombocytopenia ([place]) 07/30/2014  . Acute on chronic renal failure ([place])   . 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 Human immunodeficiency virus (HIV) disease ([place]) 12/11/2013  . Hospital discharge follow-up 12/11/2013  . CMV (cytomegalovirus infection) ([place]) 12/07/2013  . Protein-calorie malnutrition, severe ([place]) 12/06/2013  . 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 DVT (deep venous thrombosis) ([place]) 11/24/2013  . Asthma, chronic 11/24/2013  . Acquired immune deficiency syndrome ([place]) 11/14/2013  . AKI (  acute kidney injury) ([place]) 11/11/2013  . Elevated liver enzymes 11/09/2013  . History of shingles 11/09/2013  . Obesity 11/09/2013  . Pneumonia   . Asthma     Past Surgical History:  Procedure Laterality Date  . AV FISTULA PLACEMENT Left 03/03/2015   Procedure: Creation of Left Arm RADIOCEPHALIC ARTERIOVENOUS FISTULA ;  Surgeon: Mal Misty, MD;  Location: [facility];  Service: Vascular;  Laterality: Left;  . FEMUR FRACTURE SURGERY Right 09/2001  . FRACTURE SURGERY    . I&D EXTREMITY Right 12/18/2014   Procedure: IRRIGATION AND DEBRIDEMENT RIGHT LOWER EXTREMITY;  Surgeon: Ralene Ok, MD;  Location: [facility];  Service: General;  Laterality: Right;  . INSERTION OF DIALYSIS CATHETER  2016       Home Medications    Prior to Admission medications   Medication Sig Start Date End Date Taking? Authorizing Provider  abacavir (ZIAGEN) 300 MG tablet Take 2 tablets (600 mg total) by mouth daily. 10/06/15  Yes Carlyle Basques, MD  albuterol (PROVENTIL HFA;VENTOLIN HFA) 108 (90 Base) MCG/ACT inhaler Inhale 2 puffs into the lungs every 6 (six) hours as needed for wheezing or shortness of breath. 07/10/15  Yes Jule Ser, DO  carvedilol (COREG) 12.5 MG tablet Take 12.5 mg by mouth 2 (two) times daily. Reported on 04/22/2015 01/28/15  Yes Historical Provider, MD  darunavir-cobicistat (PREZCOBIX) 800-150 MG tablet Take 1 tablet by mouth daily. Swallow whole. Do NOT crush, break or chew tablets. Take with food. 10/06/15  Yes Carlyle Basques, MD  diltiazem (CARDIZEM CD) 180 MG 24 hr capsule Take 1 capsule (180 mg total) by mouth daily. 07/11/15  Yes Jule Ser, DO  dolutegravir (TIVICAY) 50 MG tablet Take 1 tablet (50 mg total) by mouth daily. 10/06/15  Yes Carlyle Basques, MD  hydrALAZINE (APRESOLINE) 25 MG tablet Take 1 tablet (25 mg total) by mouth 3 (three) times daily. 07/10/15  Yes Jule Ser, DO  metoCLOPramide (REGLAN) 5 MG tablet Take 1 tablet (5 mg total) by mouth 4 (four) times daily. 08/30/15  Yes Carlyle Basques, MD  ondansetron (ZOFRAN ODT) 8 MG disintegrating tablet Take 1 tablet (8 mg total) by mouth every 8 (eight) hours as needed for nausea or vomiting. 10/06/15  Yes Carlyle Basques, MD  calcium carbonate (TUMS - DOSED IN MG ELEMENTAL CALCIUM) 500 MG chewable tablet Chew 1 tablet (200 mg of elemental calcium total) by mouth 3 (three) times daily with  meals. Patient not taking: Reported on 11/05/2015 07/10/15   Jule Ser, DO  dexamethasone (DECADRON) 2 MG tablet Take 2 tablets (4 mg total) by mouth 2 (two) times daily. X 4 days then 1 tab twice a day x 4 days Patient not taking: Reported on 11/05/2015 09/22/15   Carlyle Basques, MD  HYDROcodone-acetaminophen (NORCO) 5-325 MG tablet Take 1-2 tablets by mouth every 6 (six) hours as needed for moderate pain or severe pain. Patient not taking: Reported on 11/05/2015 07/10/15   Jule Ser, DO  levETIRAcetam (KEPPRA) 250 MG tablet Take 1 tablet (250 mg total) by mouth See admin instructions. Take 1 tablet after your dialysis treatments. 11/05/15   Nona Dell, PA-C  levETIRAcetam (KEPPRA) 500 MG tablet Take 1 tablet (500 mg total) by mouth 1 day or 1 dose. 11/05/15 11/06/15  Nona Dell, PA-C  multivitamin (RENA-VIT) TABS tablet Take 1 tablet by mouth at bedtime. Patient not taking: Reported on 11/05/2015 07/10/15   Jule Ser, DO  promethazine (PHENERGAN) 12.5 MG tablet Take 1 tablet (12.5 mg total) by  mouth every 6 (six) hours as needed for nausea or vomiting. Patient not taking: Reported on 11/05/2015 07/10/15   Jule Ser, DO    Family History Family History  Problem Relation Age of Onset  . Hypertension Mother   . Diabetes Maternal Grandmother   . Kidney disease Maternal Grandmother     Social History Social History  Substance Use Topics  . Smoking status: Never Smoker  . Smokeless tobacco: Never Used  . Alcohol use Yes     Comment: I stopped drinking along time ago "     Allergies   Bactrim [sulfamethoxazole-trimethoprim]; Cauliflower [brassica oleracea italica]; Vancomycin; Other; Amlodipine; Augmentin [amoxicillin-pot clavulanate]; Banana; Fish allergy; and Onion   Review of Systems Review of Systems  Neurological:       Shaking/jerking  All other systems reviewed and are negative.    Physical Exam Updated Vital Signs BP (!) 172/119   Pulse 96    Temp 98.7 F (37.1 C) (Oral)   Resp 13   SpO2 99%   Physical Exam  Constitutional: He is oriented to person, place, and time. He appears well-developed and well-nourished. No distress.  HENT:  Head: Normocephalic and atraumatic. Head is without raccoon's eyes, without Battle's sign, without abrasion, without contusion and without laceration.  Mouth/Throat: Uvula is midline, oropharynx is clear and moist and mucous membranes are normal. No oropharyngeal exudate, posterior oropharyngeal edema, posterior oropharyngeal erythema or tonsillar abscesses. No tonsillar exudate.  Eyes: Conjunctivae and EOM are normal. Pupils are equal, round, and reactive to light. Right eye exhibits no discharge. Left eye exhibits no discharge. No scleral icterus.  Neck: Normal range of motion. Neck supple.  Cardiovascular: Normal rate, regular rhythm, normal heart sounds and intact distal pulses.   Pulmonary/Chest: Effort normal and breath sounds normal. No respiratory distress. He has no wheezes. He has no rales. He exhibits no tenderness.  Abdominal: Soft. Bowel sounds are normal. He exhibits no distension and no mass. There is no tenderness. There is no rebound and no guarding. No hernia.  Musculoskeletal: He exhibits no edema.  Neurological: He is alert and oriented to person, place, and time.  Pt intermittently jerking/contracting his arms appx. every 1-2 minutes. Pt able to move all 4 extremities spontaneously, sensation grossly intact. During exam pt began to have tonic-clonic seizure lasting appx 20-30 seconds. Pt post-ictal s/p seizure.  Skin: Skin is warm and dry. He is not diaphoretic.  Fistula noted to left arm, palpable thrill.  Nursing note and vitals reviewed.    ED Treatments / Results  Labs (all labs ordered are listed, but only abnormal results are displayed) Labs Reviewed  CBC WITH DIFFERENTIAL/PLATELET - Abnormal; Notable for the following:       Result Value   RBC 3.70 (*)    Hemoglobin  10.8 (*)    HCT 33.9 (*)    RDW 15.7 (*)    Platelets 116 (*)    All other components within normal limits  COMPREHENSIVE METABOLIC PANEL - Abnormal; Notable for the following:    CO2 12 (*)    Glucose, Bld 125 (*)    BUN 60 (*)    Creatinine, Ser 16.44 (*)    Calcium 8.6 (*)    ALT 11 (*)    GFR calc non Af Amer 3 (*)    GFR calc Af Amer 4 (*)    Anion gap 25 (*)    All other components within normal limits  CSF CELL COUNT WITH DIFFERENTIAL - Abnormal; Notable for  the following:    RBC Count, CSF 460 (*)    All other components within normal limits  PROTEIN, CSF - Abnormal; Notable for the following:    Total  Protein, CSF 81 (*)    All other components within normal limits  CSF CULTURE  ETHANOL  CSF CELL COUNT WITH DIFFERENTIAL  GLUCOSE, CSF  CRYPTOCOCCAL ANTIGEN, CSF  URINE RAPID DRUG SCREEN, HOSP PERFORMED  URINALYSIS, ROUTINE W REFLEX MICROSCOPIC (NOT AT [facility])  T-HELPER CELLS (CD4) COUNT (NOT AT [facility])  HIV 1 RNA QUANT-NO REFLEX-BLD  HERPES SIMPLEX VIRUS(HSV) DNA BY PCR  JC VIRUS, PCR CSF    EKG  EKG Interpretation  Date/Time:  Friday November 05 2015 06:24:57 EDT Ventricular Rate:  126 PR Interval:    QRS Duration: 94 QT Interval:  323 QTC Calculation: 468 R Axis:   85 Text Interpretation:  Sinus tachycardia LAE, consider biatrial enlargement Probable left ventricular hypertrophy Confirmed by Betsey Holiday  MD, CHRISTOPHER [phone]) on 11/05/2015 6:49:32 AM       Radiology Ct Head W & Wo Contrast  Result Date: 11/05/2015 CLINICAL DATA:  Seizure.  HIV. EXAM: CT HEAD WITHOUT AND WITH CONTRAST TECHNIQUE: Contiguous axial images were obtained from the base of the skull through the vertex without and with intravenous contrast CONTRAST:  75 mL Isovue-300 COMPARISON:  Brain MRI 07/08/2015 and noncontrast CT 07/06/2015 FINDINGS: Brain: There is no evidence of acute cortically based infarct, intracranial hemorrhage, mass, midline shift, or extra-axial fluid collection. The  ventricles and sulci are normal. There is poor delineation of the lentiform nuclei bilaterally. No abnormal enhancement is identified. Vascular: Unremarkable. Skull: No fracture or focal osseous lesion. Sinuses/Orbits: Chronic left sphenoid and bilateral maxillary sinusitis with mild mucosal thickening currently, incompletely visualized. Clear mastoid air cells. Unremarkable orbits. Other: None. IMPRESSION: 1. Poor delineation of the lentiform nuclei bilaterally corresponding to T2 signal abnormality on prior MRI. 2. No mass or enhancing lesion. Electronically Signed   By: Logan Bores M.D.   On: 11/05/2015 09:41    Procedures Procedures (including critical care time)  Medications Ordered in ED Medications  lidocaine (PF) (XYLOCAINE) 1 % injection 5 mL (5 mLs Infiltration Not Given 11/05/15 1102)  LORazepam (ATIVAN) 2 MG/ML injection (2 mg  Given 11/05/15 0621)  iopamidol (ISOVUE-300) 61 % injection (75 mLs Intravenous Contrast Given 11/05/15 0830)  lidocaine (PF) (XYLOCAINE) 1 % injection 5 mL (5 mLs Infiltration Given by Other 11/05/15 1035)  levETIRAcetam (KEPPRA) 500 mg in sodium chloride 0.9 % 100 mL IVPB (0 mg Intravenous Stopped 11/05/15 1117)     Initial Impression / Assessment and Plan / ED Course  I have reviewed the triage vital signs and the nursing notes.  Pertinent labs & imaging results that were available during my care of the patient were reviewed by me and considered in my medical decision making (see chart for details).  Clinical Course   Pt with hx of uncontrolled HIV, HTN and CKD who presents with "shaking/jerking" of arms that started yesterday. Pt reports only going to dialysis once or twice per week due to it not making him feel well. Missed his dialysis yesterday. Girlfriend at bedside reports she thinks his last dialysis treatment was 3 days ago. VSS. On initial exam, pt was laying in bed and had intermittent jerking/clonic activity to his arms, A&Ox3. During exam, pt began to  have a tonic-clonic seizure that lasted appx 20-30 seconds. Pt was post-ictal s/p seizure. Pt given ativan.   Pt with uncontrolled HIV. Chart review  shows pt's most recent CD4 count 10 and HIV RNA quant 86,000 which were done in May 2017 during an admission for HA. Pt was last seen by ID (Dr. Baxter Flattery) on 10/06/15 and was reported to have restarted his medications without missing a dose for 2 weeks. Plan for pt to follow up in 4 weeks for labs and visit in 6 weeks.   Pt remained post-ictal for appx 1.5 hours s/p seizure. I contacted pt's mother who reports that she is his power of attorney and she consented for treatment including CT head with contrast and LP. BUN 60, Cr 16.44. Remaining labs unremarkable. CT head unremarkable. On reevaluation pt was A&Ox3, pt consented to LP. LP performed by Dr. Darl Householder, opening pressure 33, CSF labs sent. Pt reported multiple times that he did not want to stay for admission for further work-up/management of his new onset seizures. Consulted neurology. Dr. Leonel Ramsay recommended loading pt with keppra in the ED and starting him on keppra 500mg  QD. Mother reports pt has a follow up with ID next week.  Initial CSF labs unremarkable, pending labs for CSF HSV, CSF JC virus, CD4 count and HIV RNA quant. On reevaluation pt continues to remain A&Ox3, no neuro deficits on exam. Pt continues to refuse admission. I had a long discussion with pt regarding risks associated with going home versus admission to complete new onset seizure workup. Pt reported understanding however he continued to refuse admission. Due to pt with unremarkable labs and imaging thus far and pt being A&Ox3, will plan to d/c pt home with Keppra and neuro follow up. Advised pt to refrain from driving until he follows up with neurology. Advised pt to follow up with ID at his scheduled appointment next week. Discussed pending results. Discussed strict return precautions with pt.  Final Clinical Impressions(s) / ED  Diagnoses   Final diagnoses:  Seizure ([place])    New Prescriptions New Prescriptions   LEVETIRACETAM (KEPPRA) 250 MG TABLET    Take 1 tablet (250 mg total) by mouth See admin instructions. Take 1 tablet after your dialysis treatments.   LEVETIRACETAM (KEPPRA) 500 MG TABLET    Take 1 tablet (500 mg total) by mouth 1 day or 1 dose.     Chesley Noon [place], [place] 11/05/15 [place], MD 11/05/15 2325

## 2015-11-05 NOTE — Discharge Instructions (Signed)
Take your seizure medication as prescribed. Continue taking your home medications as prescribed. Continue going to your dialysis treatment as scheduled. Refrain from driving until you follow up with neurology. Call the neurology clinic listed above this afternoon to schedule a follow-up appointment. Follow-up with your infectious disease doctor at your scheduled appointment next week. Please return to the Emergency Department if symptoms worsen or new onset of fever, headache, visual changes, change in mental status, chest pain, difficult breathing, abdominal pain, vomiting, unable to keep fluids down, numbness, tingling, weakness, syncope, seizure.

## 2015-11-05 NOTE — ED Notes (Signed)
Pt resting and unable to urinate at this time.

## 2015-11-05 NOTE — ED Notes (Signed)
Pt resting, unable to provide urine at this time

## 2015-11-05 NOTE — ED Notes (Signed)
Per mom pt has been complaining of severe headaches after dialysis for about 1 year.

## 2015-11-05 NOTE — ED Notes (Signed)
Patient transported to CT 

## 2015-11-05 NOTE — ED Triage Notes (Signed)
Pt. reports seizure episode this morning witnessed by wife , alert and oriented at arrival / respirations unlabored , he missed his hemodialysis yesterday .

## 2015-11-05 NOTE — ED Notes (Signed)
Pt verbalizes understanding of discharge instructions. Verbalizes understanding of no driving until follow up with neurology. Ambulatory. NAD. Being driven home by girlfriend.

## 2015-11-05 NOTE — ED Notes (Signed)
Pt beginning to vomit on departure. PA notified and verbal order given for 8 mg ODT zofran.

## 2015-11-06 LAB — HIV-1 RNA QUANT-NO REFLEX-BLD
HIV 1 RNA Quant: 125000 {copies}/mL
LOG10 HIV-1 RNA: 5.097 {Log_copies}/mL

## 2015-11-07 LAB — HERPES SIMPLEX VIRUS(HSV) DNA BY PCR
HSV 1 DNA: NEGATIVE
HSV 2 DNA: NEGATIVE

## 2015-11-08 ENCOUNTER — Other Ambulatory Visit: Payer: Self-pay

## 2015-11-09 LAB — CSF CULTURE: Culture: NO GROWTH

## 2015-11-09 LAB — CSF CULTURE W GRAM STAIN

## 2015-11-09 LAB — JC VIRUS, PCR CSF: JC Virus PCR, CSF: NEGATIVE

## 2015-11-10 ENCOUNTER — Emergency Department (HOSPITAL_COMMUNITY)
Admission: EM | Admit: 2015-11-10 | Discharge: 2015-11-10 | Disposition: A | Discharge status: Home / Self Care | Payer: BLUE CROSS/BLUE SHIELD | Attending: Emergency Medicine | Admitting: Emergency Medicine

## 2015-11-10 ENCOUNTER — Encounter (HOSPITAL_COMMUNITY): Payer: Self-pay | Admitting: *Deleted

## 2015-11-10 DIAGNOSIS — J45909 Unspecified asthma, uncomplicated: Secondary | ICD-10-CM | POA: Insufficient documentation

## 2015-11-10 DIAGNOSIS — Z79899 Other long term (current) drug therapy: Secondary | ICD-10-CM | POA: Insufficient documentation

## 2015-11-10 DIAGNOSIS — Z992 Dependence on renal dialysis: Secondary | ICD-10-CM | POA: Insufficient documentation

## 2015-11-10 DIAGNOSIS — N186 End stage renal disease: Secondary | ICD-10-CM | POA: Insufficient documentation

## 2015-11-10 DIAGNOSIS — R51 Headache: Secondary | ICD-10-CM | POA: Insufficient documentation

## 2015-11-10 DIAGNOSIS — I120 Hypertensive chronic kidney disease with stage 5 chronic kidney disease or end stage renal disease: Secondary | ICD-10-CM | POA: Insufficient documentation

## 2015-11-10 DIAGNOSIS — R519 Headache, unspecified: Secondary | ICD-10-CM

## 2015-11-10 MED ORDER — LORAZEPAM 1 MG PO TABS
1.0000 mg | ORAL_TABLET | Freq: Once | ORAL | Status: AC
  Administered 2015-11-10: 1 mg via ORAL
  Filled 2015-11-10: qty 1

## 2015-11-10 MED ORDER — CLONIDINE HCL 0.2 MG PO TABS
0.2000 mg | ORAL_TABLET | Freq: Once | ORAL | Status: AC
  Administered 2015-11-10: 0.2 mg via ORAL
  Filled 2015-11-10: qty 1

## 2015-11-10 MED ORDER — METOCLOPRAMIDE HCL 5 MG/ML IJ SOLN
10.0000 mg | Freq: Once | INTRAMUSCULAR | Status: AC
  Administered 2015-11-10: 10 mg via INTRAMUSCULAR
  Filled 2015-11-10: qty 2

## 2015-11-10 MED ORDER — CARVEDILOL 12.5 MG PO TABS
12.5000 mg | ORAL_TABLET | Freq: Once | ORAL | Status: AC
  Administered 2015-11-10: 12.5 mg via ORAL
  Filled 2015-11-10: qty 1

## 2015-11-10 MED ORDER — SODIUM CHLORIDE 0.9 % IV SOLN
500.0000 mg | Freq: Once | INTRAVENOUS | Status: DC
  Filled 2015-11-10: qty 5

## 2015-11-10 MED ORDER — LEVETIRACETAM 500 MG PO TABS
1000.0000 mg | ORAL_TABLET | Freq: Once | ORAL | Status: AC
  Administered 2015-11-10: 1000 mg via ORAL
  Filled 2015-11-10: qty 2

## 2015-11-10 MED ORDER — LORAZEPAM 2 MG/ML IJ SOLN: 1.0000 mg | Freq: Once | INTRAMUSCULAR | Status: DC

## 2015-11-10 MED ORDER — OXYCODONE-ACETAMINOPHEN 5-325 MG PO TABS
2.0000 | ORAL_TABLET | Freq: Once | ORAL | Status: AC
  Administered 2015-11-10: 2 via ORAL
  Filled 2015-11-10: qty 2

## 2015-11-10 NOTE — ED Notes (Signed)
Applied seizure pads and 2L O2 per nasal cannula per RN request.

## 2015-11-10 NOTE — ED Notes (Signed)
Pt resting quietly at this time with eyes closed.  Friend at bedside.  Pt denies headache at this time

## 2015-11-10 NOTE — ED Notes (Signed)
Went in pts room to get pt ready for discharge, pts mother had called the girlfriend who was bedside, agitated and angry about pt being discharged with pts blood pressure reading. RN Maudie Mercury and MD [facility] notified.

## 2015-11-10 NOTE — ED Provider Notes (Signed)
[facility] DEPT Provider Note   CSN: [id number] Arrival date & time: 11/10/15  1213     History   Chief Complaint Chief Complaint  Patient presents with  . Headache    HPI Jim Wyatt is a 35 y.o. male.  HPI Patient is a long-standing history of recurrent headaches as well as a history of intermittent compliance with his HIV medications.  He was seen in emergency department on 11/05/2015 for headache at that time was worked up aggressively including CT scan the head and lumbar puncture with studies sent off for HSV, JC virus, fungal culture.  All cultures and tests of returning negative.  At that time the patient was recommended to be placed in the hospital for additional workup but he refused.  He was started on Keppra after consultation by neurology for possible seizure-like activity.  Patient returns emergency department today after dialysis where he reports recurrent headache at this time.  He denies fevers and chills.  No neck pain or stiffness.  Denies weakness of arms or legs.  Family reports no altered mental status.  Patient denies back pain.  No chest pain or shortness of breath.  Denies abdominal pain.  No recent injury or fall.   Past Medical History:  Diagnosis Date  . Asthma   . CAP (community acquired pneumonia) 10/2013  . Cellulitis 11/2013   LLE  . Chronic kidney disease   . DVT (deep venous thrombosis) ([place]) 10/2013   BLE  . Fever   . Headache   . HIV infection ([place]) dx'd 10/2013  . Hypertension   . Leg pain   . Multiple environmental allergies    "trees, dogs, cats"  . Multiple food allergies   . Shingles < 2010  . 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 AIDS (acquired immune deficiency syndrome) ([place])   . Meningitis   . Cephalalgia   . Blurry vision, bilateral   . Headache   . Hypertensive urgency   . New onset of headache in immunocompromised patient ([place]) 07/06/2015  . ESRD on  dialysis ([place]) 04/27/2015  . CAP (community acquired pneumonia) 02/25/2015  . End stage renal disease ([place]) 02/16/2015  . Swelling of right knee joint 01/12/2015  . History of DVT (deep vein thrombosis) 01/12/2015  . Anemia 01/12/2015  . Acute blood loss anemia   . Candidal esophagitis ([place])   . Constipation   . Nausea without vomiting 12/20/2014  . Abscess of right thigh 12/19/2014  . Chronic kidney disease (CKD), stage IV (severe) ([place]) 12/19/2014  . 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 Anemia due to end stage renal disease ([place]) 12/19/2014  . Hyponatremia 12/19/2014  . Community acquired pneumonia 07/30/2014  . Sepsis ([place]) 07/30/2014  . Metabolic acidosis 25/06/3974  . Anemia of chronic disease 07/30/2014  . Thrombocytopenia ([place]) 07/30/2014  . Acute on chronic renal failure ([place])   . 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 Human immunodeficiency virus (HIV) disease ([place]) 12/11/2013  . Hospital discharge follow-up 12/11/2013  . CMV (cytomegalovirus infection) ([place]) 12/07/2013  . Protein-calorie malnutrition, severe ([place]) 12/06/2013  . 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 DVT (deep venous thrombosis) ([place]) 11/24/2013  . Asthma, chronic 11/24/2013  . Acquired immune deficiency syndrome ([place]) 11/14/2013  . AKI (acute kidney injury) ([place]) 11/11/2013  . Elevated liver enzymes 11/09/2013  . History of shingles 11/09/2013  . Obesity 11/09/2013  . Pneumonia   . Asthma  Past Surgical History:  Procedure Laterality Date  . AV FISTULA PLACEMENT Left 03/03/2015   Procedure: Creation of Left Arm RADIOCEPHALIC ARTERIOVENOUS FISTULA ;  Surgeon: Mal Misty, MD;  Location: [facility];  Service: Vascular;  Laterality: Left;  . FEMUR FRACTURE SURGERY Right 09/2001  . FRACTURE SURGERY    . I&D EXTREMITY Right 12/18/2014   Procedure: IRRIGATION AND DEBRIDEMENT RIGHT LOWER EXTREMITY;  Surgeon: Ralene Ok, MD;  Location: [facility];   Service: General;  Laterality: Right;  . INSERTION OF DIALYSIS CATHETER  2016       Home Medications    Prior to Admission medications   Medication Sig Start Date End Date Taking? Authorizing Provider  abacavir (ZIAGEN) 300 MG tablet Take 2 tablets (600 mg total) by mouth daily. 10/06/15  Yes Carlyle Basques, MD  albuterol (PROVENTIL HFA;VENTOLIN HFA) 108 (90 Base) MCG/ACT inhaler Inhale 2 puffs into the lungs every 6 (six) hours as needed for wheezing or shortness of breath. 07/10/15  Yes Jule Ser, DO  carvedilol (COREG) 12.5 MG tablet Take 12.5 mg by mouth 2 (two) times daily. Reported on 04/22/2015 01/28/15  Yes Historical Provider, MD  darunavir-cobicistat (PREZCOBIX) 800-150 MG tablet Take 1 tablet by mouth daily. Swallow whole. Do NOT crush, break or chew tablets. Take with food. 10/06/15  Yes Carlyle Basques, MD  diltiazem (CARDIZEM CD) 180 MG 24 hr capsule Take 1 capsule (180 mg total) by mouth daily. 07/11/15  Yes Jule Ser, DO  dolutegravir (TIVICAY) 50 MG tablet Take 1 tablet (50 mg total) by mouth daily. 10/06/15  Yes Carlyle Basques, MD  hydrALAZINE (APRESOLINE) 25 MG tablet Take 1 tablet (25 mg total) by mouth 3 (three) times daily. 07/10/15  Yes Jule Ser, DO  levETIRAcetam (KEPPRA) 250 MG tablet Take 1 tablet (250 mg total) by mouth See admin instructions. Take 1 tablet after your dialysis treatments. 11/05/15  Yes Nona Dell, PA-C  levETIRAcetam (KEPPRA) 500 MG tablet Take 1 tablet (500 mg total) by mouth 1 day or 1 dose. 11/05/15 11/10/15 Yes Nona Dell, PA-C  metoCLOPramide (REGLAN) 5 MG tablet Take 1 tablet (5 mg total) by mouth 4 (four) times daily. 08/30/15  Yes Carlyle Basques, MD  multivitamin (RENA-VIT) TABS tablet Take 1 tablet by mouth at bedtime. 07/10/15  Yes Jule Ser, DO  ondansetron (ZOFRAN ODT) 8 MG disintegrating tablet Take 1 tablet (8 mg total) by mouth every 8 (eight) hours as needed for nausea or vomiting. 10/06/15  Yes Carlyle Basques,  MD  promethazine (PHENERGAN) 12.5 MG tablet Take 1 tablet (12.5 mg total) by mouth every 6 (six) hours as needed for nausea or vomiting. 07/10/15  Yes Jule Ser, DO  calcium carbonate (TUMS - DOSED IN MG ELEMENTAL CALCIUM) 500 MG chewable tablet Chew 1 tablet (200 mg of elemental calcium total) by mouth 3 (three) times daily with meals. Patient not taking: Reported on 11/10/2015 07/10/15   Jule Ser, DO  dexamethasone (DECADRON) 2 MG tablet Take 2 tablets (4 mg total) by mouth 2 (two) times daily. X 4 days then 1 tab twice a day x 4 days Patient not taking: Reported on 11/10/2015 09/22/15   Carlyle Basques, MD  HYDROcodone-acetaminophen (NORCO) 5-325 MG tablet Take 1-2 tablets by mouth every 6 (six) hours as needed for moderate pain or severe pain. Patient not taking: Reported on 11/10/2015 07/10/15   Jule Ser, DO    Family History Family History  Problem Relation Age of Onset  . Hypertension Mother   . Diabetes  Maternal Grandmother   . Kidney disease Maternal Grandmother     Social History Social History  Substance Use Topics  . Smoking status: Never Smoker  . Smokeless tobacco: Never Used  . Alcohol use Yes     Comment: I stopped drinking along time ago "     Allergies   Bactrim [sulfamethoxazole-trimethoprim]; Cauliflower [brassica oleracea italica]; Vancomycin; Other; Amlodipine; Augmentin [amoxicillin-pot clavulanate]; Banana; Fish allergy; and Onion   Review of Systems Review of Systems  All other systems reviewed and are negative.    Physical Exam Updated Vital Signs BP (!) 174/125 (BP Location: Right Arm)   Pulse 112   Temp 98.5 F (36.9 C) (Oral)   Resp 16   Ht 5\' 7"  (1.702 m)   Wt 143 lb 4.8 oz (65 kg)   SpO2 96%   BMI 22.44 kg/m   Physical Exam  Constitutional: He is oriented to person, place, and time. He appears well-developed and well-nourished.  HENT:  Head: Normocephalic and atraumatic.  Eyes: Pupils are equal, round, and reactive to  light.  Neck: Normal range of motion. Neck supple.  No meningeal signs  Cardiovascular: Regular rhythm.   Pulmonary/Chest: Effort normal.  Abdominal: Soft.  Musculoskeletal: Normal range of motion.  Neurological: He is alert and oriented to person, place, and time.  5/5 strength in major muscle groups of  bilateral upper and lower extremities. Speech normal. No facial asymetry.   Nursing note and vitals reviewed.    ED Treatments / Results  Labs (all labs ordered are listed, but only abnormal results are displayed) Labs Reviewed - No data to display  EKG  EKG Interpretation  Date/Time:  Wednesday November 10 2015 12:27:00 EDT Ventricular Rate:  101 PR Interval:    QRS Duration: 94 QT Interval:  331 QTC Calculation: 429 R Axis:   21 Text Interpretation:  Sinus tachycardia Right atrial enlargement Borderline T abnormalities, lateral leads No significant change was found Confirmed by Eleshia Wooley  MD, Lennette Bihari ([id number]) on 11/10/2015 4:02:39 PM       Radiology No results found.  Procedures Procedures (including critical care time)  Medications Ordered in ED Medications  metoCLOPramide (REGLAN) injection 10 mg (10 mg Intramuscular Given 11/10/15 1250)  oxyCODONE-acetaminophen (PERCOCET/ROXICET) 5-325 MG per tablet 2 tablet (2 tablets Oral Given 11/10/15 1250)  levETIRAcetam (KEPPRA) tablet 1,000 mg (1,000 mg Oral Given 11/10/15 1421)  LORazepam (ATIVAN) tablet 1 mg (1 mg Oral Given 11/10/15 1421)  cloNIDine (CATAPRES) tablet 0.2 mg (0.2 mg Oral Given 11/10/15 1605)  carvedilol (COREG) tablet 12.5 mg (12.5 mg Oral Given 11/10/15 1605)     Initial Impression / Assessment and Plan / ED Course  I have reviewed the triage vital signs and the nursing notes.  Pertinent labs & imaging results that were available during my care of the patient were reviewed by me and considered in my medical decision making (see chart for details).  Clinical Course    I personally reviewed the patient's  workup on September 8 which was extensive workup.  No acute pathology was found.  Cultures and additional CSF studies were all negative.  Patient has a history of recurrent headaches.  He's been seen in his infectious disease clinic and diagnosed with cluster headaches.  The patient will need outpatient neurology follow-up.  There is a significant issue with compliance of medications at this time.  Patient was given Keppra.  Patient feels much better at this time.  Discharge home in good condition.  No indication for additional  workup given the extensive workup completed 5 days ago.  Patient updated.  Family updated.  Discharge home in good condition.  Final Clinical Impressions(s) / ED Diagnoses   Final diagnoses:  Headache, unspecified headache type    New Prescriptions New Prescriptions   No medications on file     Jola Schmidt, MD 11/10/15 1650

## 2015-11-10 NOTE — ED Notes (Signed)
Pt jerking while talking and s.o. Is flustered stating he's having a seizure. [name]&o x4 during the episode with no period of being postictal. Pt continues to states he can't breathe, Science writer at bedside as well. Pt in no distress, Dr. Venora Maples informed. The plan is to d/c pt soon.

## 2015-11-10 NOTE — ED Notes (Signed)
Pt s.o. Came to nurses station and states, "He said he is about to have a panic attack, can you give him ativan?". Family member asked to have pt breathe in his nose and out of his mouth. Dr. Venora Maples informed. Will treat pt h/a.

## 2015-11-10 NOTE — ED Triage Notes (Signed)
Pt arrives from home via [facility]. Pt states he woke up with a h/a, went to dialysis and received his full tx of 2 hrs. Pt states his h/a never improved, he took his bp meds and then called 911. Pt states he had a seizure on Friday, head CT was neg. Pt states he is now having blurred vision, photophobia and lightheadedness. Pt htn with EMS, Hx of same.

## 2015-11-10 NOTE — ED Notes (Signed)
Pt states that he can't breathe. Pt O2 saturation is at 99% on RA. Pt given 2L [place] for comfort.

## 2015-11-11 ENCOUNTER — Telehealth: Payer: Self-pay

## 2015-11-11 ENCOUNTER — Ambulatory Visit: Payer: Self-pay | Attending: Internal Medicine | Admitting: Internal Medicine

## 2015-11-11 ENCOUNTER — Encounter: Payer: Self-pay | Admitting: Internal Medicine

## 2015-11-11 VITALS — BP 112/64 | HR 114 | Temp 98.7°F | Resp 18 | Ht 67.0 in | Wt 140.4 lb

## 2015-11-11 DIAGNOSIS — N186 End stage renal disease: Secondary | ICD-10-CM | POA: Insufficient documentation

## 2015-11-11 DIAGNOSIS — Z881 Allergy status to other antibiotic agents status: Secondary | ICD-10-CM | POA: Insufficient documentation

## 2015-11-11 DIAGNOSIS — R51 Headache: Secondary | ICD-10-CM | POA: Insufficient documentation

## 2015-11-11 DIAGNOSIS — B20 Human immunodeficiency virus [HIV] disease: Secondary | ICD-10-CM | POA: Insufficient documentation

## 2015-11-11 DIAGNOSIS — Z86718 Personal history of other venous thrombosis and embolism: Secondary | ICD-10-CM | POA: Insufficient documentation

## 2015-11-11 DIAGNOSIS — Z91018 Allergy to other foods: Secondary | ICD-10-CM | POA: Insufficient documentation

## 2015-11-11 DIAGNOSIS — I120 Hypertensive chronic kidney disease with stage 5 chronic kidney disease or end stage renal disease: Secondary | ICD-10-CM | POA: Insufficient documentation

## 2015-11-11 DIAGNOSIS — N032 Chronic nephritic syndrome with diffuse membranous glomerulonephritis: Secondary | ICD-10-CM | POA: Insufficient documentation

## 2015-11-11 DIAGNOSIS — Z91013 Allergy to seafood: Secondary | ICD-10-CM | POA: Insufficient documentation

## 2015-11-11 DIAGNOSIS — Z992 Dependence on renal dialysis: Secondary | ICD-10-CM | POA: Insufficient documentation

## 2015-11-11 DIAGNOSIS — Z09 Encounter for follow-up examination after completed treatment for conditions other than malignant neoplasm: Secondary | ICD-10-CM

## 2015-11-11 DIAGNOSIS — N269 Renal sclerosis, unspecified: Secondary | ICD-10-CM | POA: Insufficient documentation

## 2015-11-11 DIAGNOSIS — Z880 Allergy status to penicillin: Secondary | ICD-10-CM | POA: Insufficient documentation

## 2015-11-11 DIAGNOSIS — Z8619 Personal history of other infectious and parasitic diseases: Secondary | ICD-10-CM | POA: Insufficient documentation

## 2015-11-11 NOTE — Telephone Encounter (Signed)
Patient's Mother is calling from [facility]  They have just finished visit and patient admitted he has not taken his HIV medications in six months due to vomiting.  She is concerned and wonders if the medications can be changed.   After sereral questions it seems the patient is taking all of his medication at one time which makes it hard to separate which medication could be causing the vomiting.  I have asked that the HIV medications be taken seperatley and at least two hours before the other medications to see if vomiting occurs.  His Mother is to call our office with this information.  Patient was advised to go to the Emergency Room if his symptoms worsen.   Laverle Patter, RN

## 2015-11-11 NOTE — Progress Notes (Signed)
Jim Wyatt, is a 35 y.o. male  IOE:[id number]  HWE:[id number]  DOB - 21-May-1980  Subjective:   Jim Wyatt is a 35 y.o. male with history of HIV/AIDS, FSGS with ESRD on HD, long-standing history of recurrent headaches as well as a history of intermittent compliance with his HIV medications here today for ED visit follow up. He was seen in emergency department on 11/05/2015 for headache, at that time was worked up aggressively including CT head and lumbar puncture with studies sent off for HSV, JC virus, fungal culture. All cultures and tests are negative. At that time the patient was recommended to be placed in the hospital for additional workup but he refused. He was started on Keppra after consultation by neurology for possible seizure-like activity. Patient returned to the emergency department again on 11/10/2015 after dialysis where he reported recurrent headache. He denied fever and chills. No neck pain or stiffness. Denies weakness of arms or legs. He was diagnosed with cluster headache and recommended for Neurology outpatient follow up. He was given keppra and discharged home to be followed up here today. He just returned from a dialysis session, present today with his mother who is visiting to make sure he is compliant with medications and dialysis sessions. He has previously not been compliant. He has intermittent vomiting, and nausea. No new complaint except for weakness and tiredness following dialysis today. Denies any fever. No chest pain, No abdominal pain, No new weakness tingling or numbness, No Cough - SOB.  ALLERGIES: Allergies  Allergen Reactions  . Bactrim [Sulfamethoxazole-Trimethoprim] Other (See Comments)    Renal failure  . Cauliflower [Brassica Oleracea Italica] Anaphylaxis and Other (See Comments)    Pt states his throat closes up  . Vancomycin Other (See Comments)    Patient states vancomycin caused kidney injury  . Other     Patient has multiple food  allergies, says he is allergic to corn, but not corn containing products. Peanuts  . Amlodipine Palpitations    fatigue  . Augmentin [Amoxicillin-Pot Clavulanate] Other (See Comments)    unknown  . Banana Other (See Comments)  . Fish Allergy Hives  . Onion Rash    PAST MEDICAL HISTORY: Past Medical History:  Diagnosis Date  . Asthma   . CAP (community acquired pneumonia) 10/2013  . Cellulitis 11/2013   LLE  . Chronic kidney disease   . DVT (deep venous thrombosis) ([place]) 10/2013   BLE  . Fever   . Headache   . HIV infection ([place]) dx'd 10/2013  . Hypertension   . Leg pain   . Multiple environmental allergies    "trees, dogs, cats"  . Multiple food allergies   . Shingles < 2010  . 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 Authorizing Provider  abacavir (ZIAGEN) 300 MG tablet Take 2 tablets (600 mg total) by mouth daily. 10/06/15   Carlyle Basques, MD  albuterol (PROVENTIL HFA;VENTOLIN HFA) 108 (90 Base) MCG/ACT inhaler Inhale 2 puffs into the lungs every 6 (six) hours as needed for wheezing or shortness of breath. 07/10/15   Jule Ser, DO  calcium carbonate (TUMS - DOSED IN MG ELEMENTAL CALCIUM) 500 MG chewable tablet Chew 1 tablet (200 mg of elemental calcium total) by mouth 3 (three) times daily with meals. Patient not taking: Reported on 11/10/2015 07/10/15   Jule Ser, DO  carvedilol (COREG) 12.5 MG tablet Take 12.5 mg by mouth 2 (two) times  daily. Reported on 04/22/2015 01/28/15   Historical Provider, MD  darunavir-cobicistat (PREZCOBIX) 800-150 MG tablet Take 1 tablet by mouth daily. Swallow whole. Do NOT crush, break or chew tablets. Take with food. 10/06/15   Carlyle Basques, MD  dexamethasone (DECADRON) 2 MG tablet Take 2 tablets (4 mg total) by mouth 2 (two) times daily. X 4 days then 1 tab twice a day x 4 days Patient not taking: Reported on 11/10/2015 09/22/15   Carlyle Basques, MD  diltiazem (CARDIZEM CD) 180 MG 24 hr capsule Take 1 capsule (180 mg total) by mouth daily. 07/11/15   Jule Ser, DO  dolutegravir (TIVICAY) 50 MG tablet Take 1 tablet (50 mg total) by mouth daily. 10/06/15   Carlyle Basques, MD  hydrALAZINE (APRESOLINE) 25 MG tablet Take 1 tablet (25 mg total) by mouth 3 (three) times daily. 07/10/15   Jule Ser, DO  HYDROcodone-acetaminophen (NORCO) 5-325 MG tablet Take 1-2 tablets by mouth every 6 (six) hours as needed for moderate pain or severe pain. Patient not taking: Reported on 11/10/2015 07/10/15   Jule Ser, DO  levETIRAcetam (KEPPRA) 250 MG tablet Take 1 tablet (250 mg total) by mouth See admin instructions. Take 1 tablet after your dialysis treatments. 11/05/15   Nona Dell, PA-C  levETIRAcetam (KEPPRA) 500 MG tablet Take 1 tablet (500 mg total) by mouth 1 day or 1 dose. 11/05/15 11/10/15  Nona Dell, PA-C  metoCLOPramide (REGLAN) 5 MG tablet Take 1 tablet (5 mg total) by mouth 4 (four) times daily. 08/30/15   Carlyle Basques, MD  multivitamin (RENA-VIT) TABS tablet Take 1 tablet by mouth at bedtime. 07/10/15   Jule Ser, DO  ondansetron (ZOFRAN ODT) 8 MG disintegrating tablet Take 1 tablet (8 mg total) by mouth every 8 (eight) hours as needed for nausea or vomiting. 10/06/15   Carlyle Basques, MD  promethazine (PHENERGAN) 12.5 MG tablet Take 1 tablet (12.5 mg total) by mouth every 6 (six) hours as needed for nausea or vomiting. 07/10/15   Jule Ser, DO    Objective:   Vitals:   11/11/15 1447  BP: 112/64  Pulse: (!) 114  Resp: 18  Temp: 98.7 F (37.1 C)  TempSrc: Oral  SpO2: 97%  Weight: 140 lb 6.4 oz (63.7 kg)  Height: 5\' 7"  (1.702 m)   Exam General appearance : Awake, alert, not in any distress but appears tired (just finished a dialysis session). Speech Clear. Chronically ill-looking HEENT: Atraumatic and Normocephalic, pupils equally reactive to light and accomodation Neck: Supple, no JVD. No  cervical lymphadenopathy.  Chest: Good air entry bilaterally, no added sounds  CVS: S1 S2 regular, no murmurs.  Abdomen: Bowel sounds present, Non tender and not distended with no gaurding, rigidity or rebound. Extremities: B/L Lower Ext shows no edema, both legs are warm to touch Neurology: Awake alert, and oriented X 3, CN II-XII intact, Non focal Skin: No Rash  Data Review No results found for: HGBA1C  Assessment & Plan   1. Human immunodeficiency virus (HIV) disease ([place])  - Continue current medications per ID specialist - Follow up with ID as scheduled  2. 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 Hospital discharge follow-up  - Continue current medications and other management  Patient have been counseled extensively about nutrition and exercise  Return in about 4 weeks (around 12/09/2015), or if  symptoms worsen or fail to improve, for Abdominal Pain, N/V, Follow up HTN.  The patient was given clear instructions to go to ER or return to medical center if symptoms don't improve, worsen or new problems develop. The patient verbalized understanding. The patient was told to call to get lab results if they haven't heard anything in the next week.   This note has been created with Surveyor, quantity. Any transcriptional errors are unintentional.    [name], MD, [place], Karilyn Cota, [place] and [place], [place]   11/11/2015, 2:58 PM

## 2015-11-11 NOTE — Patient Instructions (Signed)

## 2015-11-11 NOTE — Progress Notes (Signed)
Patient is here for HFU  Patient complains of being fatigue.  Patient has taken medication today.  Patient denies any suicidal ideations at this time.

## 2015-11-15 ENCOUNTER — Encounter: Payer: Self-pay | Admitting: *Deleted

## 2015-11-15 ENCOUNTER — Encounter: Payer: Self-pay | Admitting: Internal Medicine

## 2015-11-18 ENCOUNTER — Telehealth: Payer: Self-pay

## 2015-11-18 NOTE — Telephone Encounter (Signed)
Pt contacted the office and stated he would like an appointment because he is having some major ear pain. I informed pt he that Dr. Doreene Burke is booked and we don't have a walk-in provider today or tomrorow pt would like to know what he should do for the time being. I put patient on hold and went and spoke with Dr. Doreene Burke. Per Dr. Doreene Burke did pt go to dialysis today pt stated he was at dyalsis and he was about to leave. Per Dr. Doreene Burke if the ear pain gets worse this evening then pt will need to go to ed because the ear pain is probably coming from the dialysis. I made pt aware of the order that Dr. Doreene Burke gave pt states he will just go to the ED because his ear has gotten worse

## 2015-11-21 ENCOUNTER — Inpatient Hospital Stay (HOSPITAL_COMMUNITY)
Admission: EM | Admit: 2015-11-21 | Discharge: 2015-11-24 | DRG: 154 | Disposition: A | Discharge status: Home / Self Care | Payer: Self-pay | Attending: Oncology | Admitting: Oncology

## 2015-11-21 ENCOUNTER — Telehealth: Payer: Self-pay | Admitting: Internal Medicine

## 2015-11-21 ENCOUNTER — Encounter (HOSPITAL_COMMUNITY): Payer: Self-pay | Admitting: *Deleted

## 2015-11-21 DIAGNOSIS — Z86718 Personal history of other venous thrombosis and embolism: Secondary | ICD-10-CM

## 2015-11-21 DIAGNOSIS — Z8249 Family history of ischemic heart disease and other diseases of the circulatory system: Secondary | ICD-10-CM

## 2015-11-21 DIAGNOSIS — B20 Human immunodeficiency virus [HIV] disease: Secondary | ICD-10-CM | POA: Diagnosis present

## 2015-11-21 DIAGNOSIS — Z9114 Patient's other noncompliance with medication regimen: Secondary | ICD-10-CM

## 2015-11-21 DIAGNOSIS — R569 Unspecified convulsions: Secondary | ICD-10-CM

## 2015-11-21 DIAGNOSIS — Z882 Allergy status to sulfonamides status: Secondary | ICD-10-CM

## 2015-11-21 DIAGNOSIS — H6091 Unspecified otitis externa, right ear: Principal | ICD-10-CM | POA: Diagnosis present

## 2015-11-21 DIAGNOSIS — H6093 Unspecified otitis externa, bilateral: Secondary | ICD-10-CM | POA: Diagnosis present

## 2015-11-21 DIAGNOSIS — Z833 Family history of diabetes mellitus: Secondary | ICD-10-CM

## 2015-11-21 DIAGNOSIS — N186 End stage renal disease: Secondary | ICD-10-CM | POA: Diagnosis present

## 2015-11-21 DIAGNOSIS — D631 Anemia in chronic kidney disease: Secondary | ICD-10-CM | POA: Diagnosis present

## 2015-11-21 DIAGNOSIS — Z9119 Patient's noncompliance with other medical treatment and regimen: Secondary | ICD-10-CM

## 2015-11-21 DIAGNOSIS — I120 Hypertensive chronic kidney disease with stage 5 chronic kidney disease or end stage renal disease: Secondary | ICD-10-CM | POA: Diagnosis present

## 2015-11-21 DIAGNOSIS — B9562 Methicillin resistant Staphylococcus aureus infection as the cause of diseases classified elsewhere: Secondary | ICD-10-CM | POA: Diagnosis present

## 2015-11-21 DIAGNOSIS — H6691 Otitis media, unspecified, right ear: Secondary | ICD-10-CM | POA: Diagnosis present

## 2015-11-21 DIAGNOSIS — E875 Hyperkalemia: Secondary | ICD-10-CM | POA: Diagnosis present

## 2015-11-21 DIAGNOSIS — Z79899 Other long term (current) drug therapy: Secondary | ICD-10-CM

## 2015-11-21 DIAGNOSIS — E8889 Other specified metabolic disorders: Secondary | ICD-10-CM | POA: Diagnosis present

## 2015-11-21 DIAGNOSIS — J320 Chronic maxillary sinusitis: Secondary | ICD-10-CM | POA: Diagnosis present

## 2015-11-21 DIAGNOSIS — H6120 Impacted cerumen, unspecified ear: Secondary | ICD-10-CM | POA: Diagnosis present

## 2015-11-21 DIAGNOSIS — J45909 Unspecified asthma, uncomplicated: Secondary | ICD-10-CM | POA: Diagnosis present

## 2015-11-21 DIAGNOSIS — N29 Other disorders of kidney and ureter in diseases classified elsewhere: Secondary | ICD-10-CM

## 2015-11-21 DIAGNOSIS — Z888 Allergy status to other drugs, medicaments and biological substances status: Secondary | ICD-10-CM

## 2015-11-21 DIAGNOSIS — Z992 Dependence on renal dialysis: Secondary | ICD-10-CM

## 2015-11-21 LAB — COMPREHENSIVE METABOLIC PANEL
ALT: 10 U/L — ABNORMAL LOW (ref 17–63)
AST: 11 U/L — ABNORMAL LOW (ref 15–41)
Albumin: 2.8 g/dL — ABNORMAL LOW (ref 3.5–5.0)
Alkaline Phosphatase: 40 U/L (ref 38–126)
Anion gap: 12 (ref 5–15)
BUN: 36 mg/dL — ABNORMAL HIGH (ref 6–20)
CO2: 27 mmol/L (ref 22–32)
Calcium: 8.7 mg/dL — ABNORMAL LOW (ref 8.9–10.3)
Chloride: 94 mmol/L — ABNORMAL LOW (ref 101–111)
Creatinine, Ser: 9.93 mg/dL — ABNORMAL HIGH (ref 0.61–1.24)
GFR calc Af Amer: 7 mL/min — ABNORMAL LOW (ref >60)
GFR calc non Af Amer: 6 mL/min — ABNORMAL LOW (ref >60)
Glucose, Bld: 74 mg/dL (ref 65–99)
Potassium: 5.5 mmol/L — ABNORMAL HIGH (ref 3.5–5.1)
Sodium: 133 mmol/L — ABNORMAL LOW (ref 135–145)
Total Bilirubin: 0.6 mg/dL (ref 0.3–1.2)
Total Protein: 7.2 g/dL (ref 6.5–8.1)

## 2015-11-21 LAB — CBC WITH DIFFERENTIAL/PLATELET
Basophils Absolute: 0 K/uL (ref 0.0–0.1)
Basophils Relative: 0 %
Eosinophils Absolute: 0.6 K/uL (ref 0.0–0.7)
Eosinophils Relative: 6 %
HCT: 30.8 % — ABNORMAL LOW (ref 39.0–52.0)
Hemoglobin: 9.6 g/dL — ABNORMAL LOW (ref 13.0–17.0)
Lymphocytes Relative: 8 %
Lymphs Abs: 0.7 K/uL (ref 0.7–4.0)
MCH: 28.7 pg (ref 26.0–34.0)
MCHC: 31.2 g/dL (ref 30.0–36.0)
MCV: 92.2 fL (ref 78.0–100.0)
Monocytes Absolute: 0.8 K/uL (ref 0.1–1.0)
Monocytes Relative: 8 %
Neutro Abs: 7.5 K/uL (ref 1.7–7.7)
Neutrophils Relative %: 78 %
Platelets: 196 K/uL (ref 150–400)
RBC: 3.34 MIL/uL — ABNORMAL LOW (ref 4.22–5.81)
RDW: 14.9 % (ref 11.5–15.5)
WBC: 9.6 K/uL (ref 4.0–10.5)

## 2015-11-21 LAB — I-STAT CG4 LACTIC ACID, ED: Lactic Acid, Venous: 0.89 mmol/L (ref 0.5–1.9)

## 2015-11-21 MED ORDER — ABACAVIR SULFATE 300 MG PO TABS
600.0000 mg | ORAL_TABLET | Freq: Every day | ORAL | Status: DC
  Filled 2015-11-21: qty 2

## 2015-11-21 MED ORDER — SUCROFERRIC OXYHYDROXIDE 500 MG PO CHEW
500.0000 mg | CHEWABLE_TABLET | Freq: Three times a day (TID) | ORAL | Status: DC
  Administered 2015-11-22 – 2015-11-24 (×6): 500 mg via ORAL
  Filled 2015-11-21 (×8): qty 1

## 2015-11-21 MED ORDER — ONDANSETRON HCL 4 MG/2ML IJ SOLN
4.0000 mg | Freq: Four times a day (QID) | INTRAMUSCULAR | Status: DC | PRN
  Administered 2015-11-23 – 2015-11-24 (×2): 4 mg via INTRAVENOUS
  Filled 2015-11-21 (×2): qty 2

## 2015-11-21 MED ORDER — ONDANSETRON HCL 4 MG/2ML IJ SOLN
4.0000 mg | Freq: Once | INTRAMUSCULAR | Status: AC
  Administered 2015-11-21: 4 mg via INTRAVENOUS
  Filled 2015-11-21: qty 2

## 2015-11-21 MED ORDER — SODIUM CHLORIDE 0.9 % IV SOLN
Freq: Once | INTRAVENOUS | Status: AC
  Administered 2015-11-21: via INTRAVENOUS

## 2015-11-21 MED ORDER — HYDRALAZINE HCL 50 MG PO TABS
100.0000 mg | ORAL_TABLET | ORAL | Status: DC
  Administered 2015-11-22 – 2015-11-24 (×7): 100 mg via ORAL
  Filled 2015-11-21 (×7): qty 2

## 2015-11-21 MED ORDER — SODIUM CHLORIDE 0.9 % IV BOLUS (SEPSIS)
1000.0000 mL | INTRAVENOUS | Status: AC
  Administered 2015-11-21: 1000 mL via INTRAVENOUS

## 2015-11-21 MED ORDER — DILTIAZEM HCL ER COATED BEADS 180 MG PO CP24
180.0000 mg | ORAL_CAPSULE | Freq: Every day | ORAL | Status: DC
  Administered 2015-11-22 – 2015-11-24 (×3): 180 mg via ORAL
  Filled 2015-11-21 (×3): qty 1

## 2015-11-21 MED ORDER — HYDROMORPHONE HCL 1 MG/ML IJ SOLN
0.5000 mg | Freq: Four times a day (QID) | INTRAMUSCULAR | Status: DC | PRN
  Administered 2015-11-22 – 2015-11-24 (×4): 0.5 mg via INTRAVENOUS
  Filled 2015-11-21 (×5): qty 1

## 2015-11-21 MED ORDER — LEVETIRACETAM 500 MG PO TABS
500.0000 mg | ORAL_TABLET | ORAL | Status: DC
  Administered 2015-11-22 (×2): 500 mg via ORAL
  Filled 2015-11-21 (×2): qty 1

## 2015-11-21 MED ORDER — DOLUTEGRAVIR SODIUM 50 MG PO TABS: 50.0000 mg | ORAL_TABLET | Freq: Every day | ORAL | Status: DC

## 2015-11-21 MED ORDER — ONDANSETRON HCL 4 MG PO TABS: 4.0000 mg | ORAL_TABLET | Freq: Four times a day (QID) | ORAL | Status: DC | PRN

## 2015-11-21 MED ORDER — HYDROMORPHONE HCL 1 MG/ML IJ SOLN
0.5000 mg | Freq: Once | INTRAMUSCULAR | Status: AC
Start: 2015-11-21 — End: 2015-11-21
  Administered 2015-11-21: 0.5 mg via INTRAVENOUS
  Filled 2015-11-21 (×2): qty 1

## 2015-11-21 MED ORDER — LEVETIRACETAM 250 MG PO TABS
250.0000 mg | ORAL_TABLET | ORAL | Status: DC
  Administered 2015-11-23: 250 mg via ORAL
  Filled 2015-11-21: qty 1

## 2015-11-21 MED ORDER — DEXTROSE 5 % IV SOLN
1.0000 g | INTRAVENOUS | Status: DC
  Administered 2015-11-21 – 2015-11-23 (×3): 1 g via INTRAVENOUS
  Filled 2015-11-21 (×4): qty 1

## 2015-11-21 MED ORDER — HEPARIN SODIUM (PORCINE) 5000 UNIT/ML IJ SOLN
5000.0000 [IU] | Freq: Three times a day (TID) | INTRAMUSCULAR | Status: DC
  Administered 2015-11-22 – 2015-11-23 (×7): 5000 [IU] via SUBCUTANEOUS
  Filled 2015-11-21 (×6): qty 1

## 2015-11-21 MED ORDER — CARVEDILOL 12.5 MG PO TABS
12.5000 mg | ORAL_TABLET | Freq: Two times a day (BID) | ORAL | Status: DC
  Administered 2015-11-22 – 2015-11-24 (×4): 12.5 mg via ORAL
  Filled 2015-11-21 (×4): qty 1

## 2015-11-21 MED ORDER — LOSARTAN POTASSIUM 50 MG PO TABS
100.0000 mg | ORAL_TABLET | Freq: Every day | ORAL | Status: DC
  Administered 2015-11-22: 100 mg via ORAL
  Filled 2015-11-21 (×2): qty 2

## 2015-11-21 MED ORDER — ACETAMINOPHEN 650 MG RE SUPP: 650.0000 mg | Freq: Four times a day (QID) | RECTAL | Status: DC | PRN

## 2015-11-21 MED ORDER — VANCOMYCIN HCL 10 G IV SOLR
1500.0000 mg | Freq: Once | INTRAVENOUS | Status: DC
  Filled 2015-11-21: qty 1500

## 2015-11-21 MED ORDER — DARUNAVIR-COBICISTAT 800-150 MG PO TABS
1.0000 | ORAL_TABLET | Freq: Every day | ORAL | Status: DC
  Filled 2015-11-21: qty 1

## 2015-11-21 MED ORDER — DAPTOMYCIN 500 MG IV SOLR
4.0000 mg/kg | Freq: Once | INTRAVENOUS | Status: AC
  Administered 2015-11-22: 273 mg via INTRAVENOUS
  Filled 2015-11-21: qty 5.46

## 2015-11-21 MED ORDER — CIPROFLOXACIN-DEXAMETHASONE 0.3-0.1 % OT SUSP
4.0000 [drp] | Freq: Two times a day (BID) | OTIC | Status: DC
  Administered 2015-11-22 – 2015-11-24 (×6): 4 [drp] via OTIC
  Filled 2015-11-21: qty 7.5

## 2015-11-21 MED ORDER — ACETAMINOPHEN 325 MG PO TABS
650.0000 mg | ORAL_TABLET | Freq: Four times a day (QID) | ORAL | Status: DC | PRN
  Administered 2015-11-22 – 2015-11-23 (×2): 650 mg via ORAL
  Filled 2015-11-21 (×2): qty 2

## 2015-11-21 NOTE — Progress Notes (Signed)
Pharmacy Antibiotic Note  Jim Wyatt is a 35 y.o. male admitted on 11/21/2015 with HIV/AIDS/otits externa. Last CD4 count <20. MD ordering abx by recomendation of Dr. Johnnye Sima.  Pharmacy has been consulted for vancomycin/ceftazidime dosing. ESRD on HD TTS - last on 9/23. Afebrile, wbc wnl.  Plan: Vanc 1500mg  IV x 1; then 750mg  IV qHD TTS (not yet entered) Ceftazidime 1g IV q24h Monitor clinical progress, c/s, renal function, abx plan/LOT Vanc levels as indicated   Height: 5\' 7"  (170.2 cm) Weight: 154 lb 5.2 oz (70 kg) IBW/kg (Calculated) : 66.1  Temp (24hrs), Avg:98 F (36.7 C), Min:98 F (36.7 C), Max:98 F (36.7 C)   Recent Labs Lab 11/21/15 2010 11/21/15 2054  WBC 9.6  --   CREATININE 9.93*  --   LATICACIDVEN  --  0.89    Estimated Creatinine Clearance: 10 mL/min (by C-G formula based on SCr of 9.93 mg/dL (H)).    Allergies  Allergen Reactions  . Bactrim [Sulfamethoxazole-Trimethoprim] Other (See Comments)    Renal failure  . Cauliflower [Brassica Oleracea Italica] Anaphylaxis and Other (See Comments)    Pt states his throat closes up  . Coconut Flavor Hives and Shortness Of Breath  . Fish Allergy Hives and Shortness Of Breath    Throat closes up  . Onion Hives and Shortness Of Breath  . Vancomycin Other (See Comments)    Patient states vancomycin caused kidney injury  . Corn-Containing Products Other (See Comments)    Reaction to corn per allergy test (corn cereal ok)  . Peanut-Containing Drug Products Hives  . Reglan [Metoclopramide] Other (See Comments)    Causes ticks/ jerks  . Amlodipine Palpitations and Other (See Comments)    fatigue  . Augmentin [Amoxicillin-Pot Clavulanate] Hives  . Banana Other (See Comments)    Per allergy test    Antimicrobials this admission: 9/24 vanc >>  9/24 ceftaz >>   Dose adjustments this admission:   Microbiology results:   Elicia Lamp, PharmD, BCPS Clinical Pharmacist 11/21/2015 9:40 PM

## 2015-11-21 NOTE — Telephone Encounter (Signed)
Opened by mistake.

## 2015-11-21 NOTE — ED Triage Notes (Signed)
Pt reports having seizure here on 9/9. Still having right side facial swelling, right ear pain and now having twitching/tremors again similar to tremors prior to seizure on 9/9. Was also seen here on 9/13. Reports taking all meds as prescribed. Last dialysis was Saturday.

## 2015-11-21 NOTE — ED Provider Notes (Signed)
[facility] DEPT Provider Note   CSN: [id number] Arrival date & time: 11/21/15  1713     History   Chief Complaint Chief Complaint  Patient presents with  . Otalgia  . Facial Swelling    HPI Jim Wyatt is a 35 y.o. male with past medical history of end-stage renal disease and HIV/AIDS. He presents today with chief complaint of right ear pain. He's been seen multiple times for severe headache and most recently had a seizure. Patient complains of right ear pain has been ongoing for the past several days. He has noticed some drainage from the ear. He denies headaches, visual disturbances. No known fevers. His last viral load was 86,000, and his CD4 count was less than 20.Patient dialyzes Tuesday, Thursday, Saturday and last dialyzed yesterday.  HPI  Past Medical History:  Diagnosis Date  . Asthma   . CAP (community acquired pneumonia) 10/2013  . Cellulitis 11/2013   LLE  . Chronic kidney disease   . DVT (deep venous thrombosis) ([place]) 10/2013   BLE  . Fever   . Headache   . HIV infection ([place]) dx'd 10/2013  . Hypertension   . Leg pain   . Multiple environmental allergies    "trees, dogs, cats"  . Multiple food allergies   . Shingles < 2010  . 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 Otitis externa of both ears 11/21/2015  . AIDS (acquired immunodeficiency syndrome), CD4 <=200 ([place])   . Meningitis   . Cephalalgia   . Blurry vision, bilateral   . Headache   . Hypertensive urgency   . New onset of headache in immunocompromised patient ([place]) 07/06/2015  . ESRD on dialysis ([place]) 04/27/2015  . CAP (community acquired pneumonia) 02/25/2015  . ESRD (end stage renal disease) ([place]) 02/16/2015  . Swelling of right knee joint 01/12/2015  . History of DVT (deep vein thrombosis) 01/12/2015  . Anemia 01/12/2015  . Acute blood loss anemia   . Candidal esophagitis ([place])   . Constipation   . Nausea without vomiting  12/20/2014  . Abscess of right thigh 12/19/2014  . Chronic kidney disease (CKD), stage IV (severe) ([place]) 12/19/2014  . 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 Anemia due to end stage renal disease ([place]) 12/19/2014  . Hyponatremia 12/19/2014  . Community acquired pneumonia 07/30/2014  . Sepsis ([place]) 07/30/2014  . Metabolic acidosis 38/11/1749  . Anemia of chronic disease 07/30/2014  . Thrombocytopenia ([place]) 07/30/2014  . Acute on chronic renal failure ([place])   . 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 Human immunodeficiency virus (HIV) disease ([place]) 12/11/2013  . Hospital discharge follow-up 12/11/2013  . CMV (cytomegalovirus infection) ([place]) 12/07/2013  . Protein-calorie malnutrition, severe ([place]) 12/06/2013  . 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 DVT (deep venous thrombosis) ([place]) 11/24/2013  . Asthma, chronic 11/24/2013  . Acquired immune deficiency syndrome ([place]) 11/14/2013  . AKI (acute kidney injury) ([place]) 11/11/2013  . Elevated liver enzymes 11/09/2013  . History of shingles 11/09/2013  . Obesity 11/09/2013  . Pneumonia   . Asthma     Past Surgical History:  Procedure Laterality Date  . AV FISTULA PLACEMENT Left 03/03/2015   Procedure: Creation of Left Arm RADIOCEPHALIC ARTERIOVENOUS FISTULA ;  Surgeon: Mal Misty, MD;  Location: [facility];  Service: Vascular;  Laterality: Left;  . FEMUR FRACTURE SURGERY Right 09/2001  . FRACTURE SURGERY    . I&D EXTREMITY Right 12/18/2014  Procedure: IRRIGATION AND DEBRIDEMENT RIGHT LOWER EXTREMITY;  Surgeon: Ralene Ok, MD;  Location: [facility];  Service: General;  Laterality: Right;  . INSERTION OF DIALYSIS CATHETER  2016       Home Medications    Prior to Admission medications   Medication Sig Start Date End Date Taking? Authorizing Provider  abacavir (ZIAGEN) 300 MG tablet Take 2 tablets (600 mg total) by mouth daily. Patient taking differently: Take 300 mg by mouth every  evening.  10/06/15  Yes Carlyle Basques, MD  acetaminophen (TYLENOL) 500 MG tablet Take 1,000 mg by mouth every 6 (six) hours as needed (pain).   Yes Historical Provider, MD  albuterol (PROVENTIL HFA;VENTOLIN HFA) 108 (90 Base) MCG/ACT inhaler Inhale 2 puffs into the lungs every 6 (six) hours as needed for wheezing or shortness of breath. 07/10/15  Yes Jule Ser, DO  B Complex-C-Folic Acid (DIALYVITE 756) 0.8 MG WAFR Take 800 mg by mouth daily.   Yes Historical Provider, MD  carvedilol (COREG) 12.5 MG tablet Take 12.5 mg by mouth 2 (two) times daily. Reported on 04/22/2015 01/28/15  Yes Historical Provider, MD  Cyanocobalamin (VITAMIN B-12 PO) Take 6,000 mcg by mouth 3 (three) times daily. 2 gummies equal 6000 mcg   Yes Historical Provider, MD  darunavir-cobicistat (PREZCOBIX) 800-150 MG tablet Take 1 tablet by mouth daily. Swallow whole. Do NOT crush, break or chew tablets. Take with food. Patient taking differently: Take 1 tablet by mouth every evening. Swallow whole. Do NOT crush, break or chew tablets. Take with food. 10/06/15  Yes Carlyle Basques, MD  diltiazem (CARDIZEM CD) 180 MG 24 hr capsule Take 1 capsule (180 mg total) by mouth daily. 07/11/15  Yes Jule Ser, DO  diphenhydrAMINE (BENADRYL) 25 MG tablet Take 25 mg by mouth every 4 (four) hours.   Yes Historical Provider, MD  dolutegravir (TIVICAY) 50 MG tablet Take 1 tablet (50 mg total) by mouth daily. Patient taking differently: Take 50 mg by mouth every evening.  10/06/15  Yes Carlyle Basques, MD  hydrALAZINE (APRESOLINE) 100 MG tablet Take 100 mg by mouth 3 (three) times daily. 6:30am, 2:30pm, 11pm - for high blood pressure   Yes Historical Provider, MD  levETIRAcetam (KEPPRA) 250 MG tablet Take 1 tablet (250 mg total) by mouth See admin instructions. Take 1 tablet after your dialysis treatments. Patient taking differently: Take 250 mg by mouth See admin instructions. Take 1 tablet (250 mg) by mouth on Tuesday, Thursday, Saturday at 7pm  (dialysis days) 11/05/15  Yes Nona Dell, PA-C  levETIRAcetam (KEPPRA) 500 MG tablet Take 500 mg by mouth See admin instructions. Take 1 tablet (500 mg) by mouth Sunday, Monday, Wednesday, Friday at 7pm (non-dialysis days)   Yes Historical Provider, MD  losartan (COZAAR) 100 MG tablet Take 100 mg by mouth at bedtime.   Yes Historical Provider, MD  ondansetron (ZOFRAN) 4 MG tablet Take 4 mg by mouth every 12 (twelve) hours as needed for nausea or vomiting.  11/10/15  Yes Historical Provider, MD  oxymetazoline (AFRIN) 0.05 % nasal spray Place 2-3 sprays into both nostrils 2 (two) times daily.   Yes Historical Provider, MD  sucroferric oxyhydroxide (VELPHORO) 500 MG chewable tablet Chew 500 mg by mouth See admin instructions. Chew 1 tablet (500 mg) by mouth after every meal (2-3 times daily)   Yes Historical Provider, MD  azithromycin (ZITHROMAX) 600 MG tablet Take 2 tablets (1,200 mg total) by mouth once a week. 11/24/15   Florinda Marker, MD  calcium carbonate (  TUMS - DOSED IN MG ELEMENTAL CALCIUM) 500 MG chewable tablet Chew 1 tablet (200 mg of elemental calcium total) by mouth 3 (three) times daily with meals. Patient not taking: Reported on 11/21/2015 07/10/15   Jule Ser, DO  cefTAZidime 1 g in dextrose 5 % 50 mL Inject 1 g into the vein every other day. 2 grams IV with dialysis until December 09, 2015. 11/24/15   Florinda Marker, MD  ciprofloxacin-dexamethasone (CIPRODEX) otic suspension Place 4 drops into the right ear 2 (two) times daily. 11/24/15   Florinda Marker, MD  dapsone 100 MG tablet Take 1 tablet (100 mg total) by mouth daily. 11/24/15   Florinda Marker, MD  hydrALAZINE (APRESOLINE) 25 MG tablet Take 1 tablet (25 mg total) by mouth 3 (three) times daily. Patient not taking: Reported on 11/21/2015 07/10/15   Jule Ser, DO  HYDROcodone-acetaminophen [facility]) 5-325 MG tablet Take 1-2 tablets by mouth every 6 (six) hours as needed for moderate pain or severe pain. Patient not taking:  Reported on 11/21/2015 07/10/15   Jule Ser, DO  multivitamin (RENA-VIT) TABS tablet Take 1 tablet by mouth at bedtime. Patient not taking: Reported on 11/21/2015 07/10/15   Jule Ser, DO  ondansetron (ZOFRAN ODT) 8 MG disintegrating tablet Take 1 tablet (8 mg total) by mouth every 8 (eight) hours as needed for nausea or vomiting. Patient not taking: Reported on 11/21/2015 10/06/15   Carlyle Basques, MD  promethazine (PHENERGAN) 12.5 MG tablet Take 1 tablet (12.5 mg total) by mouth every 6 (six) hours as needed for nausea or vomiting. Patient not taking: Reported on 11/21/2015 07/10/15   Jule Ser, DO    Family History Family History  Problem Relation Age of Onset  . Hypertension Mother   . Diabetes Maternal Grandmother   . Kidney disease Maternal Grandmother     Social History Social History  Substance Use Topics  . Smoking status: Never Smoker  . Smokeless tobacco: Never Used  . Alcohol use Yes     Comment: I stopped drinking along time ago "     Allergies   Bactrim [sulfamethoxazole-trimethoprim]; Cauliflower [brassica oleracea italica]; Coconut flavor; Fish allergy; Onion; Vancomycin; Peanut-containing drug products; Reglan [metoclopramide]; Amlodipine; Augmentin [amoxicillin-pot clavulanate]; and Banana   Review of Systems Review of Systems Ten systems are reviewed and are negative for acute change except as noted in the HPI  Physical Exam Updated Vital Signs BP (!) 199/91 (BP Location: Left Leg)   Pulse (!) 101   Temp 98.2 F (36.8 C) (Oral)   Resp 18   Ht 5\' 7"  (1.702 m)   Wt 68.2 kg   SpO2 99%   BMI 23.55 kg/m   Physical Exam  Constitutional: He appears well-developed and well-nourished. No distress.  HENT:  Head: Normocephalic and atraumatic.  Right Ear: There is drainage, swelling and tenderness. There is mastoid tenderness.  Left Ear: There is drainage and swelling. No tenderness.  Bilateral ear canals with neck white discharge. The left ear is  nonpainful, however, there is reactive auricular adenopathy. Right ear with auricular adenopathy, draining white discharge from the ear, exquisitely tender, tender around the mastoid process.  Eyes: EOM are normal. Pupils are equal, round, and reactive to light.  Neck: Normal range of motion. Neck supple.  Cardiovascular: Normal rate and regular rhythm.   Pulmonary/Chest: Effort normal and breath sounds normal.  Abdominal: Soft.  Nursing note and vitals reviewed.    ED Treatments / Results  Labs (all labs ordered are listed,  but only abnormal results are displayed) Labs Reviewed  MRSA PCR SCREENING - Abnormal; Notable for the following:       Result Value   MRSA by PCR POSITIVE (*)    All other components within normal limits  COMPREHENSIVE METABOLIC PANEL - Abnormal; Notable for the following:    Sodium 133 (*)    Potassium 5.5 (*)    Chloride 94 (*)    BUN 36 (*)    Creatinine, Ser 9.93 (*)    Calcium 8.7 (*)    Albumin 2.8 (*)    AST 11 (*)    ALT 10 (*)    GFR calc non Af Amer 6 (*)    GFR calc Af Amer 7 (*)    All other components within normal limits  CBC WITH DIFFERENTIAL/PLATELET - Abnormal; Notable for the following:    RBC 3.34 (*)    Hemoglobin 9.6 (*)    HCT 30.8 (*)    All other components within normal limits  CBC - Abnormal; Notable for the following:    RBC 3.42 (*)    Hemoglobin 9.7 (*)    HCT 31.4 (*)    All other components within normal limits  SEDIMENTATION RATE - Abnormal; Notable for the following:    Sed Rate 69 (*)    All other components within normal limits  C-REACTIVE PROTEIN - Abnormal; Notable for the following:    CRP 3.3 (*)    All other components within normal limits  RENAL FUNCTION PANEL - Abnormal; Notable for the following:    Potassium 6.1 (*)    Chloride 98 (*)    BUN 38 (*)    Creatinine, Ser 9.78 (*)    Phosphorus 6.4 (*)    Albumin 2.7 (*)    GFR calc non Af Amer 6 (*)    GFR calc Af Amer 7 (*)    All other components  within normal limits  BASIC METABOLIC PANEL - Abnormal; Notable for the following:    Potassium 6.8 (*)    Chloride 99 (*)    BUN 43 (*)    Creatinine, Ser 11.34 (*)    GFR calc non Af Amer 5 (*)    GFR calc Af Amer 6 (*)    All other components within normal limits  CD4/CD8 (T-HELPER/T-SUPPRESSOR CELL) - Abnormal; Notable for the following:    Total lymphocyte count 761 (*)    CD4% 4 (*)    CD4 absolute 30 (*)    CD8tox 57 (*)    Ratio 0.07 (*)    All other components within normal limits  BASIC METABOLIC PANEL - Abnormal; Notable for the following:    Potassium 5.3 (*)    Chloride 96 (*)    BUN 55 (*)    Creatinine, Ser 13.01 (*)    GFR calc non Af Amer 4 (*)    GFR calc Af Amer 5 (*)    All other components within normal limits  CBC - Abnormal; Notable for the following:    RBC 3.11 (*)    Hemoglobin 9.0 (*)    HCT 28.2 (*)    All other components within normal limits  RENAL FUNCTION PANEL - Abnormal; Notable for the following:    Potassium 5.2 (*)    Chloride 97 (*)    BUN 55 (*)    Creatinine, Ser 13.04 (*)    Phosphorus 7.2 (*)    Albumin 2.5 (*)    GFR calc non Af Wyvonnia Lora  4 (*)    GFR calc Af Amer 5 (*)    All other components within normal limits  BASIC METABOLIC PANEL - Abnormal; Notable for the following:    Chloride 99 (*)    Glucose, Bld 111 (*)    BUN 30 (*)    Creatinine, Ser 9.62 (*)    GFR calc non Af Amer 6 (*)    GFR calc Af Amer 7 (*)    All other components within normal limits  EAR CULTURE  AEROBIC CULTURE (SUPERFICIAL SPECIMEN)  HEPATITIS B SURFACE ANTIGEN  HIV-1 RNA ULTRAQUANT REFLEX TO GENTYP+  I-STAT CG4 LACTIC ACID, ED    EKG  EKG Interpretation  Date/Time:  Sunday November 21 2015 21:05:06 EDT Ventricular Rate:  97 PR Interval:    QRS Duration: 97 QT Interval:  369 QTC Calculation: 469 R Axis:   39 Text Interpretation:  Sinus rhythm Probable left atrial enlargement Borderline prolonged QT interval When compared with ECG of  11/10/2015 No significant change was found Confirmed by [facility]  MD, Nunzio Cory [phone]) on 11/22/2015 6:22:31 PM       Radiology No results found.  Procedures Procedures (including critical care time)  Medications Ordered in ED Medications  0.9 %  sodium chloride infusion ( Intravenous New Bag/Given 11/21/15 2341)  sodium chloride 0.9 % bolus 1,000 mL (0 mLs Intravenous Stopped 11/21/15 2144)  HYDROmorphone (DILAUDID) injection 0.5 mg (0.5 mg Intravenous Given 11/21/15 2031)  ondansetron (ZOFRAN) injection 4 mg (4 mg Intravenous Given 11/21/15 2031)  DAPTOmycin (CUBICIN) 273 mg in sodium chloride 0.9 % IVPB (273 mg Intravenous Given 11/22/15 0106)  sodium polystyrene (KAYEXALATE) 15 GM/60ML suspension 30 g (30 g Oral Given 11/22/15 0846)  calcitRIOL (ROCALTROL) 0.5 MCG capsule (0.5 mcg  Given 11/23/15 1214)     Initial Impression / Assessment and Plan / ED Course  I have reviewed the triage vital signs and the nursing notes.  Pertinent labs & imaging results that were available during my care of the patient were reviewed by me and considered in my medical decision making (see chart for details).  Clinical Course    Patient with ESRD, HIV/AIDS with high viral load and very low CD4 count. I do not feel the standard treatment for otitis externa is sufficient. His risk for abnormal infections including pseudomonas and aspergillus is high givenhis lack of immunity. He has a slightly elevated K but was dialyzed yesterday. I have discussed the case with Dr. Johnnye Sima who has given abx recommendations. The patient will be admitted by the internal medicine service. He appears stable throughout visit.   Final Clinical Impressions(s) / ED Diagnoses   Final diagnoses:  ESRD (end stage renal disease) ([place])  Otitis externa of both ears  AIDS (acquired immunodeficiency syndrome), CD4 <=200 ([place])    New Prescriptions Discharge Medication List as of 11/24/2015  1:21 PM    START taking these medications     Details  azithromycin (ZITHROMAX) 600 MG tablet Take 2 tablets (1,200 mg total) by mouth once a week., Starting Wed 11/24/2015, Normal    cefTAZidime 1 g in dextrose 5 % 50 mL Inject 1 g into the vein every other day. 2 grams IV with dialysis until December 09, 2015., Starting Wed 11/24/2015, No Print    ciprofloxacin-dexamethasone (CIPRODEX) otic suspension Place 4 drops into the right ear 2 (two) times daily., Starting Wed 11/24/2015, Normal    dapsone 100 MG tablet Take 1 tablet (100 mg total) by mouth daily., Starting Wed 11/24/2015, Normal  Margarita Mail, PA-C 11/25/15 1254    Lacretia Leigh, MD 11/29/15 Einar Crow

## 2015-11-21 NOTE — H&P (Signed)
Date: 11/21/2015               Patient Name:  Jim Wyatt MRN: [id number]  DOB: 1980/11/05 Age / Sex: 35 y.o., male   PCP: [name], MD         Medical Service: Internal Medicine Teaching Service         Attending Physician: Dr. Annia Belt, MD    First Contact: Dr. Kalman Shan Pager: [phone]  Second Contact: Dr. Quay Burow Pager: [phone]       After Hours (After 5p/  First Contact Pager: [phone]  weekends / holidays): Second Contact Pager: [phone]   Chief Complaint: Ear pain  History of Present Illness: [name] is a 35yo male with PMH of AIDS, seizure, asthma, ESRD on HD, and HTN presenting to the ED with a 5 day history of R ear pain accompanied by swelling, headache, decreased hearing and pain in the are. He states that he has not had recent illness, denied sick contacts and has been taking all his anti-retrovirals as prescribed. He denies trouble swallowing, acute change in vision, change in appetite, syncope, dizziness, fevers, or chills. He was recently diagnosed with seizures (had one episode of tonic-clonic seizure in the ED) and was started on Keppra; he refused admission for further f/u at the time. CT at the time was negative, CSF studies and cultures were also negative including JC and HSV. He states compliance with that, but states he does still have some occasional jerking movements. He is on HD T, Th, S; he does still make urine. He denies chest pain, shortness of breath, abdominal pain, N/V/D, paraesthesias or weakness.  Last HIV RNA quant was 125,000 and CD count was 20 on 9/08.   WBC 9.6, Hgb 9.6, Hct 30.8; Na 133, K 5.5, Cl 94, CO2 27, BUN 36, Cr 9.93, Ca 8.7, Albumin 2.8  Meds:  Current Facility-Administered Medications for the 11/21/15 encounter [facility] Encounter)  Medication  . amitriptyline (ELAVIL) tablet 15 mg   Current Meds  Medication Sig  . abacavir (ZIAGEN) 300 MG tablet Take 2 tablets (600 mg total) by mouth daily.  (Patient taking differently: Take 300 mg by mouth every evening. )  . acetaminophen (TYLENOL) 500 MG tablet Take 1,000 mg by mouth every 6 (six) hours as needed (pain).  [name] albuterol (PROVENTIL HFA;VENTOLIN HFA) 108 (90 Base) MCG/ACT inhaler Inhale 2 puffs into the lungs every 6 (six) hours as needed for wheezing or shortness of breath.  . B Complex-C-Folic Acid (DIALYVITE 716) 0.8 MG WAFR Take 800 mg by mouth daily.  . carvedilol (COREG) 12.5 MG tablet Take 12.5 mg by mouth 2 (two) times daily. Reported on 04/22/2015  . Cyanocobalamin (VITAMIN B-12 PO) Take 6,000 mcg by mouth 3 (three) times daily. 2 gummies equal 6000 mcg  . darunavir-cobicistat (PREZCOBIX) 800-150 MG tablet Take 1 tablet by mouth daily. Swallow whole. Do NOT crush, break or chew tablets. Take with food. (Patient taking differently: Take 1 tablet by mouth every evening. Swallow whole. Do NOT crush, break or chew tablets. Take with food.)  . diltiazem (CARDIZEM CD) 180 MG 24 hr capsule Take 1 capsule (180 mg total) by mouth daily.  . diphenhydrAMINE (BENADRYL) 25 MG tablet Take 25 mg by mouth every 4 (four) hours.  . dolutegravir (TIVICAY) 50 MG tablet Take 1 tablet (50 mg total) by mouth daily. (Patient taking differently: Take 50 mg by mouth every evening. )  . hydrALAZINE (APRESOLINE) 100 MG tablet  Take 100 mg by mouth 3 (three) times daily. 6:30am, 2:30pm, 11pm - for high blood pressure  . levETIRAcetam (KEPPRA) 250 MG tablet Take 1 tablet (250 mg total) by mouth See admin instructions. Take 1 tablet after your dialysis treatments. (Patient taking differently: Take 250 mg by mouth See admin instructions. Take 1 tablet (250 mg) by mouth on Tuesday, Thursday, Saturday at 7pm (dialysis days))  . levETIRAcetam (KEPPRA) 500 MG tablet Take 500 mg by mouth See admin instructions. Take 1 tablet (500 mg) by mouth Sunday, Monday, Wednesday, Friday at 7pm (non-dialysis days)  . losartan (COZAAR) 100 MG tablet Take 100 mg by mouth at bedtime.    . ondansetron (ZOFRAN) 4 MG tablet Take 4 mg by mouth every 12 (twelve) hours as needed for nausea or vomiting.   [name] oxymetazoline (AFRIN) 0.05 % nasal spray Place 2-3 sprays into both nostrils 2 (two) times daily.  . sucroferric oxyhydroxide (VELPHORO) 500 MG chewable tablet Chew 500 mg by mouth See admin instructions. Chew 1 tablet (500 mg) by mouth after every meal (2-3 times daily)     Allergies: Allergies as of 11/21/2015 - Review Complete 11/21/2015  Allergen Reaction Noted  . Bactrim [sulfamethoxazole-trimethoprim] Other (See Comments) 11/14/2013  . Cauliflower [brassica oleracea italica] Anaphylaxis and Other (See Comments) 11/10/2013  . Coconut flavor Hives and Shortness Of Breath 11/21/2015  . Fish allergy Hives and Shortness Of Breath 12/04/2013  . Onion Hives and Shortness Of Breath 12/04/2013  . Vancomycin Other (See Comments) 12/04/2013  . Corn-containing products Other (See Comments) 12/04/2013  . Peanut-containing drug products Hives 11/21/2015  . Reglan [metoclopramide] Other (See Comments) 11/21/2015  . Amlodipine Palpitations and Other (See Comments) 01/12/2015  . Augmentin [amoxicillin-pot clavulanate] Hives 09/03/2013  . Banana Other (See Comments) 11/09/2013   Past Medical History:  Diagnosis Date  . Asthma   . CAP (community acquired pneumonia) 10/2013  . Cellulitis 11/2013   LLE  . Chronic kidney disease   . DVT (deep venous thrombosis) ([place]) 10/2013   BLE  . Fever   . Headache   . HIV infection ([place]) dx'd 10/2013  . Hypertension   . Leg pain   . Multiple environmental allergies    "trees, dogs, cats"  . Multiple food allergies   . Shingles < 2010  . 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   Physical Exam: Blood pressure 146/95, pulse 96, temperature 98 F (36.7 C),  temperature source Oral, resp. rate 18, height '5\' 7"'  (1.702 m), weight 70 kg (154 lb 5.2 oz), SpO2 97 %.  Constitutional: NAD, lying comfortably in bed HEENT: diminished hearing in R ear otherwise CN 2-12 intact; white discharge in external auditory canal in R ear with no visualization of TM, associated pre-auricular tenderness, post-auricular swelling on R. L ear with clear visualization of TM without drainage. Pre-auricular lymphadenopathy bilaterally. Unable to visualize oropharynx.  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 Poor delineation of the lentiform nuclei bilaterally corresponding to T2 signal abnormality on prior MRI. 2. No mass or enhancing lesion.  Assessment & Plan by Problem: Active Problems:   Otitis externa  Otitis Externa: Patient with L  ear discharge, pain, periauricular swelling and periauricular lymphadenopathy in setting of immunocompromise with poorly controlled AIDS. ED provider spoke with Dr. Johnnye Sima in [place] who recommended vanc and ceftazidine. Patient outright refused vancomycin due to causing his kidney failure and causing N/V. Will confer with Dr. Baxter Flattery who is his ID physician for further guidance.  --ceftazidime and daptomycin per pharmacy dosing; ciprodex drops  --CT Maxofacial to evaluate extent of infection --culture of ear drainage --f/u AM labs and CRP, ESR  HIV disease: Patient on anti-retroviral regimen of Prezcobix, Abacavir, and Tivicay and reports compliance.  --Continue current regimen with zofran to control nausea --Per Dr. Storm Frisk note, patient should be on azithromycin and bactrim for MAC and PCP prophylaxis but has refused in the past; will attempt to address this  ESRD on HD: Patient with HIVAN on HD since Dec 2015 on T,Th,S schedule. Labs stable at this time, and  patient had HD day before admission. --monitor renal labs --provide HD on patient's TThS schedule if patient is still hospitalized  HTN: Patient with hypertension 2/2 to ESRD. Patient is on Coreg 12.41m BID, Cardizem 1820mDaily, hydralazine 2567mID, and losartan 100m34mily for BP control. --monitor --continue home regimen  H/o seizure: Patient had one seizure in ED this month and was started on Keppra. Evaluation in the ED was negative for cause as discussed above. Patient has outpt Neuro appointment.  --continue Keppra 500mg81mF, 250mg 26m  Dispo: Admit patient to Observation with expected length of stay less than 2 midnights.  Signed: GoricaAlphonzo Grieve/24/2017, 10:24 PM  Pager: [phone]

## 2015-11-21 NOTE — Progress Notes (Signed)
Pharmacy Antibiotic Note  Jim Wyatt is a 35 y.o. male admitted on 11/21/2015 with HIV/AIDS/otits externa.  Pharmacy has been consulted for antibiotic dosing though now pt is refusing vanc, to start Cubicin; will need official ID consult to continue after first dose.  Plan: Cubicin 4mg /kg x1 (will provide 48hr of coverage) and f/u ID rec.  Height: 5\' 7"  (170.2 cm) Weight: 150 lb 9.2 oz (68.3 kg) IBW/kg (Calculated) : 66.1  Temp (24hrs), Avg:98.3 F (36.8 C), Min:98 F (36.7 C), Max:98.6 F (37 C)   Recent Labs Lab 11/21/15 2010 11/21/15 2054  WBC 9.6  --   CREATININE 9.93*  --   LATICACIDVEN  --  0.89    Estimated Creatinine Clearance: 10 mL/min (by C-G formula based on SCr of 9.93 mg/dL (H)).    Allergies  Allergen Reactions  . Bactrim [Sulfamethoxazole-Trimethoprim] Other (See Comments)    Renal failure  . Cauliflower [Brassica Oleracea Italica] Anaphylaxis and Other (See Comments)    Pt states his throat closes up  . Coconut Flavor Hives and Shortness Of Breath  . Fish Allergy Hives and Shortness Of Breath    Throat closes up  . Onion Hives and Shortness Of Breath  . Vancomycin Other (See Comments)    Patient states vancomycin caused kidney injury  . Corn-Containing Products Other (See Comments)    Reaction to corn per allergy test (corn cereal ok)  . Peanut-Containing Drug Products Hives  . Reglan [Metoclopramide] Other (See Comments)    Causes ticks/ jerks  . Amlodipine Palpitations and Other (See Comments)    fatigue  . Augmentin [Amoxicillin-Pot Clavulanate] Hives  . Banana Other (See Comments)    Per allergy test     Thank you for allowing pharmacy to be a part of this patient's care.  Wynona Neat, PharmD, BCPS  11/21/2015 11:09 PM

## 2015-11-22 ENCOUNTER — Observation Stay (HOSPITAL_COMMUNITY): Payer: Self-pay

## 2015-11-22 DIAGNOSIS — G40909 Epilepsy, unspecified, not intractable, without status epilepticus: Secondary | ICD-10-CM

## 2015-11-22 DIAGNOSIS — H608X1 Other otitis externa, right ear: Secondary | ICD-10-CM

## 2015-11-22 DIAGNOSIS — Z22322 Carrier or suspected carrier of Methicillin resistant Staphylococcus aureus: Secondary | ICD-10-CM

## 2015-11-22 DIAGNOSIS — Z9114 Patient's other noncompliance with medication regimen: Secondary | ICD-10-CM

## 2015-11-22 DIAGNOSIS — Z79899 Other long term (current) drug therapy: Secondary | ICD-10-CM

## 2015-11-22 LAB — RENAL FUNCTION PANEL
Albumin: 2.7 g/dL — ABNORMAL LOW (ref 3.5–5.0)
Anion gap: 9 (ref 5–15)
BUN: 38 mg/dL — ABNORMAL HIGH (ref 6–20)
CO2: 29 mmol/L (ref 22–32)
Calcium: 9.1 mg/dL (ref 8.9–10.3)
Chloride: 98 mmol/L — ABNORMAL LOW (ref 101–111)
Creatinine, Ser: 9.78 mg/dL — ABNORMAL HIGH (ref 0.61–1.24)
GFR calc Af Amer: 7 mL/min — ABNORMAL LOW (ref >60)
GFR calc non Af Amer: 6 mL/min — ABNORMAL LOW (ref >60)
Glucose, Bld: 93 mg/dL (ref 65–99)
Phosphorus: 6.4 mg/dL — ABNORMAL HIGH (ref 2.5–4.6)
Potassium: 6.1 mmol/L — ABNORMAL HIGH (ref 3.5–5.1)
Sodium: 136 mmol/L (ref 135–145)

## 2015-11-22 LAB — BASIC METABOLIC PANEL
Anion gap: 11 (ref 5–15)
BUN: 43 mg/dL — ABNORMAL HIGH (ref 6–20)
CO2: 27 mmol/L (ref 22–32)
Calcium: 9.2 mg/dL (ref 8.9–10.3)
Chloride: 99 mmol/L — ABNORMAL LOW (ref 101–111)
Creatinine, Ser: 11.34 mg/dL — ABNORMAL HIGH (ref 0.61–1.24)
GFR calc Af Amer: 6 mL/min — ABNORMAL LOW (ref >60)
GFR calc non Af Amer: 5 mL/min — ABNORMAL LOW (ref >60)
Glucose, Bld: 85 mg/dL (ref 65–99)
Potassium: 6.8 mmol/L (ref 3.5–5.1)
Sodium: 137 mmol/L (ref 135–145)

## 2015-11-22 LAB — CBC
HCT: 31.4 % — ABNORMAL LOW (ref 39.0–52.0)
Hemoglobin: 9.7 g/dL — ABNORMAL LOW (ref 13.0–17.0)
MCH: 28.4 pg (ref 26.0–34.0)
MCHC: 30.9 g/dL (ref 30.0–36.0)
MCV: 91.8 fL (ref 78.0–100.0)
Platelets: 198 10*3/uL (ref 150–400)
RBC: 3.42 MIL/uL — ABNORMAL LOW (ref 4.22–5.81)
RDW: 15.3 % (ref 11.5–15.5)
WBC: 10.1 10*3/uL (ref 4.0–10.5)

## 2015-11-22 LAB — C-REACTIVE PROTEIN: CRP: 3.3 mg/dL — ABNORMAL HIGH (ref <1.0)

## 2015-11-22 LAB — SEDIMENTATION RATE: Sed Rate: 69 mm/hr — ABNORMAL HIGH (ref 0–16)

## 2015-11-22 LAB — MRSA PCR SCREENING: MRSA by PCR: POSITIVE — AB

## 2015-11-22 MED ORDER — SODIUM CHLORIDE 0.9 % IV SOLN: 62.5000 mg | INTRAVENOUS | Status: DC

## 2015-11-22 MED ORDER — SODIUM CHLORIDE 0.9 % IV SOLN
4.0000 mg/kg | Freq: Once | INTRAVENOUS | Status: DC
  Filled 2015-11-22: qty 5.46

## 2015-11-22 MED ORDER — CALCITRIOL 0.5 MCG PO CAPS
0.5000 ug | ORAL_CAPSULE | ORAL | Status: DC
  Administered 2015-11-23: 0.5 ug via ORAL
  Filled 2015-11-22: qty 1

## 2015-11-22 MED ORDER — DARUNAVIR-COBICISTAT 800-150 MG PO TABS
1.0000 | ORAL_TABLET | ORAL | Status: DC
  Administered 2015-11-22 – 2015-11-23 (×2): 1 via ORAL
  Filled 2015-11-22 (×4): qty 1

## 2015-11-22 MED ORDER — ALTEPLASE 2 MG IJ SOLR: 2.0000 mg | Freq: Once | INTRAMUSCULAR | Status: DC | PRN

## 2015-11-22 MED ORDER — ABACAVIR SULFATE 300 MG PO TABS
600.0000 mg | ORAL_TABLET | ORAL | Status: DC
  Administered 2015-11-22 – 2015-11-23 (×2): 600 mg via ORAL
  Filled 2015-11-22 (×3): qty 2

## 2015-11-22 MED ORDER — CALCIUM CARBONATE ANTACID 500 MG PO CHEW
2.0000 | CHEWABLE_TABLET | Freq: Three times a day (TID) | ORAL | Status: DC
  Administered 2015-11-23: 400 mg via ORAL
  Filled 2015-11-22 (×4): qty 2

## 2015-11-22 MED ORDER — LIDOCAINE HCL (PF) 1 % IJ SOLN: 5.0000 mL | INTRAMUSCULAR | Status: DC | PRN

## 2015-11-22 MED ORDER — SODIUM POLYSTYRENE SULFONATE 15 GM/60ML PO SUSP: 30.0000 g | Freq: Once | ORAL | Status: DC

## 2015-11-22 MED ORDER — MUPIROCIN 2 % EX OINT
1.0000 "application " | TOPICAL_OINTMENT | Freq: Two times a day (BID) | CUTANEOUS | Status: DC
  Administered 2015-11-22 – 2015-11-24 (×6): 1 via NASAL
  Filled 2015-11-22 (×2): qty 22

## 2015-11-22 MED ORDER — DOLUTEGRAVIR SODIUM 50 MG PO TABS
50.0000 mg | ORAL_TABLET | ORAL | Status: DC
  Administered 2015-11-22 – 2015-11-23 (×2): 50 mg via ORAL
  Filled 2015-11-22 (×3): qty 1

## 2015-11-22 MED ORDER — DEXTROSE 50 % IV SOLN: 1.0000 | Freq: Once | INTRAVENOUS | Status: DC

## 2015-11-22 MED ORDER — ALBUTEROL SULFATE (2.5 MG/3ML) 0.083% IN NEBU: 10.0000 mg | INHALATION_SOLUTION | Freq: Once | RESPIRATORY_TRACT | Status: DC

## 2015-11-22 MED ORDER — LIDOCAINE-PRILOCAINE 2.5-2.5 % EX CREA
1.0000 | TOPICAL_CREAM | CUTANEOUS | Status: DC | PRN
Start: 2015-11-22 — End: 2015-11-24

## 2015-11-22 MED ORDER — HEPARIN SODIUM (PORCINE) 1000 UNIT/ML DIALYSIS
5000.0000 [IU] | INTRAMUSCULAR | Status: DC | PRN
  Filled 2015-11-22: qty 5

## 2015-11-22 MED ORDER — SODIUM CHLORIDE 0.9 % IV SOLN: 100.0000 mL | INTRAVENOUS | Status: DC | PRN

## 2015-11-22 MED ORDER — PENTAFLUOROPROP-TETRAFLUOROETH EX AERO
1.0000 | INHALATION_SPRAY | CUTANEOUS | Status: DC | PRN
Start: 2015-11-22 — End: 2015-11-24

## 2015-11-22 MED ORDER — LIDOCAINE-PRILOCAINE 2.5-2.5 % EX CREA: 1.0000 "application " | TOPICAL_CREAM | CUTANEOUS | Status: DC | PRN

## 2015-11-22 MED ORDER — INSULIN ASPART 100 UNIT/ML IV SOLN: 10.0000 [IU] | Freq: Once | INTRAVENOUS | Status: DC

## 2015-11-22 MED ORDER — CHLORHEXIDINE GLUCONATE CLOTH 2 % EX PADS
6.0000 | MEDICATED_PAD | Freq: Every day | CUTANEOUS | Status: DC
  Administered 2015-11-23 – 2015-11-24 (×2): 6 via TOPICAL

## 2015-11-22 MED ORDER — SODIUM POLYSTYRENE SULFONATE 15 GM/60ML PO SUSP
30.0000 g | Freq: Once | ORAL | Status: AC
  Administered 2015-11-22: 30 g via ORAL
  Filled 2015-11-22: qty 120

## 2015-11-22 NOTE — Progress Notes (Signed)
Was notified for Potassium of 6.8.  Patient refused dialysis earlier today. Insulin followed by D50, Albuterol, Kayexalate ordered.  Patient is refusing medications.  Patient is refusing EKG currently.  Previous EKG today showed no EKG changes. He is also refusing lab draws.  He is only agreeable to having vitals done.  Talked to patient in person to discuss hyperkalemia.  Patient said he did not want to be bothered and did not want to take medications to lower potassium.  I explained that with his potassium being high he was at risk for his heart to stop and ultimately result in death.  He stated he understood and wanted to do dialysis tomorrow on his scheduled day.

## 2015-11-22 NOTE — Progress Notes (Signed)
   Subjective: Patient was seen and examined this morning. He continues to have right ear pain without drainage that has been progressively worsening over the past one week. He denies fever, chills, chest pain, shortness of breath, nausea, palpitations, or nausea.   Objective: Vital signs in last 24 hours: Vitals:   11/21/15 1900 11/21/15 2130 11/21/15 2240 11/22/15 0500  BP: 139/82 146/95 (!) 148/89 (!) 142/88  Pulse: 87 96 94 100  Resp:  _0 Temp:   98.6 F (37 C) 99.2 F (37.3 C)  TempSrc:   Oral Oral  SpO2: 90% 97% 100% 94%  Weight:   150 lb 9.2 oz (68.3 kg)   Height:   _1  (1.702 m)    Physical Exam General: Vital signs reviewed.  Patient is in no acute distress and cooperative with exam.  HEENT: Erythematous external auditory canal and tenderness on manipulation of ear.  Cardiovascular: RRR Pulmonary/Chest: Clear to auscultation bilaterally, no wheezes, rales, or rhonchi. Abdominal: Soft, non-tender Extremities: No lower extremity edema bilaterally Psychiatric: Normal mood and affect. speech and behavior is normal. Cognition and memory are normal.    Assessment/Plan: Active Problems:   Otitis externa  Severe Otitis Externa: Classified as severe as patient is immunocompromised with poorly controlled AIDS. Patient was started on Ceftazidime and Daptomycin as patient was refusing Vancomycin. He is also being treated with Ciprodex drops. Recommendations would be for patient to follow up with ENT for consideration of wick placement. Patient may be able to be transitioned to Ciprofloxacin 500 mg QD. Patient is afebrile and without leukocytosis. No evidence of malignant otitis externa. ESR and CRP are mildly elevate. CT maxillofacial showed right periauricular soft tissue swelling/ cellulitis. Soft tissue in right external auditory canal and internal ear concerning for otitis externus and otitis media without osseous changes.  -Ceftazidime and daptomycin per pharmacy  dosing -Ciprodex drops  -Culture of ear drainage pending -ID following, we appreciate recommendations  Hyperkalemia: K 6.1 this morning. Given one dose of kayexalate. No EKG changes. -Repeat BMET this afternoon -Holding losartan  AIDS: Patient on anti-retroviral regimen of Prezcobix, Abacavir, and Tivicay and reports compliance. Per Dr. Storm Frisk note, patient should be on azithromycin and bactrim for MAC and PCP prophylaxis but has refused in the past. We will discuss with pharmacy other options for ppx. Last CD4 count was 20 and HIV viral load 125,000 on 11/05/15. -Continue Prezcobix, Abacavir and Tivicay  ESRD on HD: Patient with HIVAN on HD since Dec 2015 on TTS schedule.  -Nephrology following, we appreciate recommendations  HTN: Normotensive. Patient is on Coreg 12.5 mg BID, Cardizem 180 mg Daily, hydralazine 25 mg TID, and losartan 184m daily for BP control. -Holding losartan -Continue Coreg 12.5 mg BID, Cardizem 180 mg Daily, hydralazine 25 mg TID  H/o seizure: Patient follows with Neurology. -Continue Keppra 500 mg SMWF, 2563mTTS  DVT/PE ppx: Heparin TID FEN: Renal CODE: FULL  Dispo: Anticipated discharge in approximately 2 day(s).   LOS: 0 days   AlMartyn MalayDO PGY-3 Internal Medicine Resident Pager # [phone]/25/2017 10:41 AM

## 2015-11-22 NOTE — Progress Notes (Signed)
New Admission Note:  Arrival Method: Stretcher from ED around 2230  Mental Orientation: Pt is a/o x4 Telemetry: None Assessment: Completed Skin: No skin issues Iv: Right forearm, saline locked Pain: Pt is c/o pain on right side of face and the right ear on a scale of 0-10 it is ranging from a 5-7. Tubes: None Safety Measures: Safety Fall Prevention Plan was given, discussed and signed. Admission: Completed [address] Orientation: Patient has been orientated to the room, unit and the staff. Family: At bedside upon arrival  Orders have been reviewed and implemented. Will continue to monitor the patient. Call light has been placed within reach.  Perry Mount, RN  Phone Number: [phone]

## 2015-11-22 NOTE — Progress Notes (Signed)
Pt refused to come to dialysis. Arrived at bedside to transport pt and he stated he is not needing an extra treatment and will come tomorrow on his regular scheduled dialysis day.

## 2015-11-22 NOTE — Care Management Note (Signed)
Case Management Note  Patient Details  Name: Jim Wyatt MRN: [id number] Date of Birth: August 30, 1980  Subjective/Objective:       CM following for progression and d/c planning.              Action/Plan: 11/22/2015 This CM attempted to meet with pt and was met at door by representatives from finance who were visiting pt to assist with Medicaid application. Per this rep the pt reports an income of $5000 per month , therefore would not qualify for medicaid. The pt reports that his insurance goes into effect on Nov 28, 2015. This CM attempted to discuss with pt a plan for HD in the interim and to verify if he will actually have insurance at that time or if he will be starting a new job at that time, which would effect when his new insurance became effective. The pt stated that this information was "too personal" to discuss. This CM then reminded the pt that in order to assist him we must ask "personal' questions. Pt was reminded that the HD center may not continue treatments if he has no payor source, however the pt laughingly stated that they were already billing him.  This pt was unwilling to discuss options and was very vague about his work, his insurance and his plans. This was discussed with the pt RN, who stated that unfortunately this pt has declined most of his treatments today and has taken phone call while she tried to provide care and explanations to him. Will continue to follow and assist as needed.   Expected Discharge Date:  11/25/15               Expected Discharge Plan:  Home/Self Care  In-House Referral:  Clinical Social Work  Discharge planning Services  CM Consult  Post Acute Care Choice:  NA Choice offered to:     DME Arranged:    DME Agency:     HH Arranged:    HH Agency:     Status of Service:  In process, will continue to follow  If discussed at Long Length of Stay Meetings, dates discussed:    Additional Comments:  Adron Bene, RN 11/22/2015, 4:16 PM

## 2015-11-22 NOTE — Consult Note (Signed)
[facility]  Date of Admission:  11/21/2015  Date of Consult:  11/22/2015  Reason for Consult: Otitis Externa w/poorly controlled AIDS Referring Physician: Beryle Beams   Impression/Recommendation Severe Otitis Externa Otitis Media  AIDS ESRD on HD HTN +MRSA PCR Hyperkalemia  No evidence of malignant otitis externa on CT Will stop dapto Continue Ceftazidime for now Await ear culture (although superficial typically not helpful) Continue Cipro otic drops. Consider ENT consult for ear wick placement Monitor WBC and for fever Continue Prezocobix, Abacavir, and Tivicay- premedicate with zofran as needed  Consider Ambre eval for assistance with adherence.  Check CD4 count and HIV RNA load States he will take HD in AM  Thank you so much for this interesting consult,  (pager) [phone] [email or web address]  Jim Wyatt is an 35 y.o. male.  HPI: PMH of HIV/AIDS with poor medication compliance, headaches, ESRD on HD, FSGS, HIVAN, chronic anemia, HTN, CMV, shingles, candidal esophagitis, DVT, seizure, and asthma admitted on 9/24 with acute severe otitis externa.   He is followed by Dr. Wilder Glade for his HIV/AIDS.  He was last seen in office 10/06/2015.  Labs 11/04/2012 show a CD4 count of 20 and VL 125,000. He has known history of poor compliance with his medication and reports premedication with Zofran helps him keep down his medications without vomiting.  He denies any missed doses of his ART other than 9/24.    Recently evaluated in ED on 11/05/2015 for headache. Workup with negative head CT and lumbar puncture with negative workup of HSV, JC virus, and fungal culture.  He was started on keppra by neurology for possible seizure like activity.  He denied admission for further workup for headache and possible seizure, but returned on 9/13 with recurrent headaches.  He was afebrile with no meningeal concerns and therefore discharged home with recommended  neurology followup.    He presented on 9/24 with 5 day history of right ear pain with white discharge, headache, swelling, and decreased hearing. On exam he had preauricular swelling, periauricular lymphadenopathy, and pain with manipulation.  Denied any fever, chills, dizziness, dysphagia, anorexia, change in vision, shortness of breath, chest pain, or sick contacts.  He was afebrile.  WBC 9.6, sed rate 69, C-reactive protein 3.3, and lactic acid 0.89.  CT maxillofacial showed right periauricular soft tissue swelling/cellulitis; soft tissue in right external auditory canal and internal ear concerning for otitis externus and otitis media without osseous changes; moderate chronic maxillary sinusitis; maxillary molar dental caries; lymphadenopathy within the neck.  He refused vancomycin due to his concerns of prior history of it causing his renal failure and nausea.  Therefore he was started on daptomycin and ceftazidime IV, and cipro drops.  Overnight, Tmax 100.2 with stable vitals signs.  WBC 10.1 today.  He has additionally refused dialysis today as it is not his normal T/Th/Sat schedule, as his K was 6.1-> 6.8.  He is also refusing treatment for hyperkalemia.  ID consult for further management with antibiotic guidance given his immunocompromised state.    He is very clear that he wants to go home.   HIV 1 RNA Quant (copies/mL)  Date Value  11/05/2015 125,000  07/06/2015 86,000  04/22/2015 83,181 (H)   CD4 T Cell Abs (/uL)  Date Value  11/05/2015 20 (L)  07/06/2015 10 (L)  04/22/2015 20 (L)     Past Medical History:  Diagnosis Date  . Asthma   . CAP (community acquired pneumonia) 10/2013  . Cellulitis 11/2013  LLE  . Chronic kidney disease   . DVT (deep venous thrombosis) ([place]) 10/2013   BLE  . Fever   . Headache   . HIV infection ([place]) dx'd 10/2013  . Hypertension   . Leg pain   . Multiple environmental allergies    "trees, dogs, cats"  . Multiple food allergies   . Shingles <  2010  . 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 AV FISTULA PLACEMENT Left 03/03/2015   Procedure: Creation of Left Arm RADIOCEPHALIC ARTERIOVENOUS FISTULA ;  Surgeon: Mal Misty, MD;  Location: [facility];  Service: Vascular;  Laterality: Left;  . FEMUR FRACTURE SURGERY Right 09/2001  . FRACTURE SURGERY    . I&D EXTREMITY Right 12/18/2014   Procedure: IRRIGATION AND DEBRIDEMENT RIGHT LOWER EXTREMITY;  Surgeon: Ralene Ok, MD;  Location: [facility];  Service: General;  Laterality: Right;  . INSERTION OF DIALYSIS CATHETER  2016     Allergies  Allergen Reactions  . Bactrim [Sulfamethoxazole-Trimethoprim] Other (See Comments)    Renal failure  . Cauliflower [Brassica Oleracea Italica] Anaphylaxis and Other (See Comments)    Pt states his throat closes up  . Coconut Flavor Hives and Shortness Of Breath  . Fish Allergy Hives and Shortness Of Breath    Throat closes up  . Onion Hives and Shortness Of Breath  . Vancomycin Other (See Comments)    Patient states vancomycin caused kidney injury  . Corn-Containing Products Other (See Comments)    Reaction to corn per allergy test (corn cereal ok)  . Peanut-Containing Drug Products Hives  . Reglan [Metoclopramide] Other (See Comments)    Causes ticks/ jerks  . Amlodipine Palpitations and Other (See Comments)    fatigue  . Augmentin [Amoxicillin-Pot Clavulanate] Hives  . Banana Other (See Comments)    Per allergy test    Medications:  Scheduled: . abacavir  600 mg Oral Q24H  . carvedilol  12.5 mg Oral BID WC  . cefTAZidime (FORTAZ)  IV  1 g Intravenous Q24H  . Chlorhexidine Gluconate Cloth  6 each Topical Q0600  . ciprofloxacin-dexamethasone  4 drop Both Ears BID  . darunavir-cobicistat  1 tablet Oral Q24H  . diltiazem  180 mg Oral Daily  . dolutegravir  50 mg Oral Q24H  . [START ON 11/25/2015] ferric gluconate (FERRLECIT/NULECIT) IV  62.5 mg Intravenous Q Thu-HD  . heparin   5,000 Units Subcutaneous Q8H  . hydrALAZINE  100 mg Oral 3 times per day  . [START ON 11/23/2015] levETIRAcetam  250 mg Oral Once per day on Tue Thu Sat  . levETIRAcetam  500 mg Oral Once per day on Sun Mon Wed Fri  . mupirocin ointment  1 application Nasal BID  . sucroferric oxyhydroxide  500 mg Oral TID WC    Abtx:  Anti-infectives    Start     Dose/Rate Route Frequency Ordered Stop   11/22/15 1800  abacavir (ZIAGEN) tablet 600 mg     600 mg Oral Every 24 hours 11/22/15 0932     11/22/15 1800  darunavir-cobicistat (PREZCOBIX) 800-150 MG per tablet 1 tablet     1 tablet Oral Every 24 hours 11/22/15 0932     11/22/15 1800  dolutegravir (TIVICAY) tablet 50 mg     50 mg Oral Every 24 hours 11/22/15 0932     11/22/15 1000  abacavir (ZIAGEN) tablet 600 mg  Status:  Discontinued     600 mg Oral Daily 11/21/15 2242  11/22/15 0932   11/22/15 1000  dolutegravir (TIVICAY) tablet 50 mg  Status:  Discontinued     50 mg Oral Daily 11/21/15 2242 11/22/15 0932   11/22/15 0800  darunavir-cobicistat (PREZCOBIX) 800-150 MG per tablet 1 tablet  Status:  Discontinued     1 tablet Oral Daily with breakfast 11/21/15 2242 11/22/15 0932   11/21/15 2315  DAPTOmycin (CUBICIN) 273 mg in sodium chloride 0.9 % IVPB     4 mg/kg  68.3 kg 210.9 mL/hr over 30 Minutes Intravenous  Once 11/21/15 2307 11/22/15 0136   11/21/15 2145  vancomycin (VANCOCIN) 1,500 mg in sodium chloride 0.9 % 500 mL IVPB  Status:  Discontinued     1,500 mg 250 mL/hr over 120 Minutes Intravenous  Once 11/21/15 2140 11/21/15 2247   11/21/15 2145  cefTAZidime (FORTAZ) 1 g in dextrose 5 % 50 mL IVPB     1 g 100 mL/hr over 30 Minutes Intravenous Every 24 hours 11/21/15 2140        Total days of antibiotics: 1          Social History:  reports that he has never smoked. He has never used smokeless tobacco. He reports that he drinks alcohol. He reports that he does not use drugs.  Family History  Problem Relation Age of Onset  .  Hypertension Mother   . Diabetes Maternal Grandmother   . Kidney disease Maternal Grandmother     Negative except right ear pain and diminshed hearing from right ear.  Blood pressure (!) 144/82, pulse 96, temperature 100.1 F (37.8 C), temperature source Oral, resp. rate 16, height '5\' 7"'  (1.702 m), weight 68.3 kg (150 lb 9.2 oz), SpO2 96 %. General appearance: alert and no acute distress Ears: right periauricular swelling and tenderness.  D/c present in both ears.  Throat: lips, mucosa, and tongue normal; teeth and gums normal Neck: supple, symmetrical, trachea midline Lungs: clear to auscultation bilaterally Heart: regular rate and rhythm, S1, S2 normal, no murmur, click, rub or gallop Abdomen: soft, non-tender; bowel sounds normal; no masses,  no organomegaly Extremities: left forearm AVF +b/t Skin: Skin color, texture, turgor normal. No rashes or lesions Neurologic: Mental status: Alert, oriented, thought content appropriate   Results for orders placed or performed during the hospital encounter of 11/21/15 (from the past 48 hour(s))  Comprehensive metabolic panel     Status: Abnormal   Collection Time: 11/21/15  8:10 PM  Result Value Ref Range   Sodium 133 (L) 135 - 145 mmol/L   Potassium 5.5 (H) 3.5 - 5.1 mmol/L   Chloride 94 (L) 101 - 111 mmol/L   CO2 27 22 - 32 mmol/L   Glucose, Bld 74 65 - 99 mg/dL   BUN 36 (H) 6 - 20 mg/dL   Creatinine, Ser 9.93 (H) 0.61 - 1.24 mg/dL   Calcium 8.7 (L) 8.9 - 10.3 mg/dL   Total Protein 7.2 6.5 - 8.1 g/dL   Albumin 2.8 (L) 3.5 - 5.0 g/dL   AST 11 (L) 15 - 41 U/L   ALT 10 (L) 17 - 63 U/L   Alkaline Phosphatase 40 38 - 126 U/L   Total Bilirubin 0.6 0.3 - 1.2 mg/dL   GFR calc non Af Amer 6 (L) >60 mL/min   GFR calc Af Amer 7 (L) >60 mL/min    Comment: (NOTE) The eGFR has been calculated using the CKD EPI equation. This calculation has not been validated in all clinical situations. eGFR's persistently <60 mL/min signify possible  Chronic  Kidney Disease.    Anion gap 12 5 - 15  CBC with Differential     Status: Abnormal   Collection Time: 11/21/15  8:10 PM  Result Value Ref Range   WBC 9.6 4.0 - 10.5 K/uL   RBC 3.34 (L) 4.22 - 5.81 MIL/uL   Hemoglobin 9.6 (L) 13.0 - 17.0 g/dL   HCT 30.8 (L) 39.0 - 52.0 %   MCV 92.2 78.0 - 100.0 fL   MCH 28.7 26.0 - 34.0 pg   MCHC 31.2 30.0 - 36.0 g/dL   RDW 14.9 11.5 - 15.5 %   Platelets 196 150 - 400 K/uL   Neutrophils Relative % 78 %   Neutro Abs 7.5 1.7 - 7.7 K/uL   Lymphocytes Relative 8 %   Lymphs Abs 0.7 0.7 - 4.0 K/uL   Monocytes Relative 8 %   Monocytes Absolute 0.8 0.1 - 1.0 K/uL   Eosinophils Relative 6 %   Eosinophils Absolute 0.6 0.0 - 0.7 K/uL   Basophils Relative 0 %   Basophils Absolute 0.0 0.0 - 0.1 K/uL  I-Stat CG4 Lactic Acid, ED     Status: None   Collection Time: 11/21/15  8:54 PM  Result Value Ref Range   Lactic Acid, Venous 0.89 0.5 - 1.9 mmol/L  MRSA PCR Screening     Status: Abnormal   Collection Time: 11/21/15 11:00 PM  Result Value Ref Range   MRSA by PCR POSITIVE (A) NEGATIVE    Comment:        The GeneXpert MRSA Assay (FDA approved for NASAL specimens only), is one component of a comprehensive MRSA colonization surveillance program. It is not intended to diagnose MRSA infection nor to guide or monitor treatment for MRSA infections. RESULT CALLED TO, READ BACK BY AND VERIFIED WITH: DAVIS,C RN [id number] AT 0202 SKEEN,P   CBC     Status: Abnormal   Collection Time: 11/22/15 12:33 AM  Result Value Ref Range   WBC 10.1 4.0 - 10.5 K/uL   RBC 3.42 (L) 4.22 - 5.81 MIL/uL   Hemoglobin 9.7 (L) 13.0 - 17.0 g/dL   HCT 31.4 (L) 39.0 - 52.0 %   MCV 91.8 78.0 - 100.0 fL   MCH 28.4 26.0 - 34.0 pg   MCHC 30.9 30.0 - 36.0 g/dL   RDW 15.3 11.5 - 15.5 %   Platelets 198 150 - 400 K/uL  Sedimentation rate     Status: Abnormal   Collection Time: 11/22/15 12:33 AM  Result Value Ref Range   Sed Rate 69 (H) 0 - 16 mm/hr  C-reactive protein     Status:  Abnormal   Collection Time: 11/22/15 12:33 AM  Result Value Ref Range   CRP 3.3 (H) <1.0 mg/dL  Renal function panel     Status: Abnormal   Collection Time: 11/22/15 12:33 AM  Result Value Ref Range   Sodium 136 135 - 145 mmol/L   Potassium 6.1 (H) 3.5 - 5.1 mmol/L   Chloride 98 (L) 101 - 111 mmol/L   CO2 29 22 - 32 mmol/L   Glucose, Bld 93 65 - 99 mg/dL   BUN 38 (H) 6 - 20 mg/dL   Creatinine, Ser 9.78 (H) 0.61 - 1.24 mg/dL   Calcium 9.1 8.9 - 10.3 mg/dL   Phosphorus 6.4 (H) 2.5 - 4.6 mg/dL   Albumin 2.7 (L) 3.5 - 5.0 g/dL   GFR calc non Af Amer 6 (L) >60 mL/min   GFR calc Af Amer 7 (L) >  60 mL/min    Comment: (NOTE) The eGFR has been calculated using the CKD EPI equation. This calculation has not been validated in all clinical situations. eGFR's persistently <60 mL/min signify possible Chronic Kidney Disease.    Anion gap 9 5 - 15  Basic metabolic panel     Status: Abnormal   Collection Time: 11/22/15 11:51 AM  Result Value Ref Range   Sodium 137 135 - 145 mmol/L   Potassium 6.8 (HH) 3.5 - 5.1 mmol/L    Comment: NO VISIBLE HEMOLYSIS CRITICAL RESULT CALLED TO, READ BACK BY AND VERIFIED WITH: J.NERENDAS,RN 11/22/15 1304 BY BSLADE    Chloride 99 (L) 101 - 111 mmol/L   CO2 27 22 - 32 mmol/L   Glucose, Bld 85 65 - 99 mg/dL   BUN 43 (H) 6 - 20 mg/dL   Creatinine, Ser 11.34 (H) 0.61 - 1.24 mg/dL   Calcium 9.2 8.9 - 10.3 mg/dL   GFR calc non Af Amer 5 (L) >60 mL/min   GFR calc Af Amer 6 (L) >60 mL/min    Comment: (NOTE) The eGFR has been calculated using the CKD EPI equation. This calculation has not been validated in all clinical situations. eGFR's persistently <60 mL/min signify possible Chronic Kidney Disease.    Anion gap 11 5 - 15      Component Value Date/Time   SDES CSF 11/05/2015 1035   SPECREQUEST TUBE 2 11/05/2015 1035   CULT NO GROWTH 3 DAYS 11/05/2015 1035   REPTSTATUS 11/09/2015 FINAL 11/05/2015 1035   Ct Maxillofacial Wo Contrast  Result Date:  11/22/2015 CLINICAL DATA:  Severe teeth pain, bilateral ear pain and fever for 1 week. History of HIV, headache. EXAM: CT MAXILLOFACIAL WITHOUT CONTRAST TECHNIQUE: Multidetector CT imaging of the maxillofacial structures was performed. Multiplanar CT image reconstructions were also generated. A small metallic BB was placed on the right temple in order to reliably differentiate right from left. GFR 7. COMPARISON:  CT HEAD November 05, 2015 FINDINGS: OSSEOUS: The mandible is intact, the condyles are located. No acute facial fracture. No destructive bony lesions. Tooth 1 and 16 dental caries. No additional dental pathology. ORBITS: Ocular globes and orbital contents are normal. SINUSES: Moderate chronic maxillary sinusitis with mucosal thickening. Remaining paranasal sinuses are well-aerated. Ostiomeatal units are patent. Soft tissue effaces the RIGHT external auditory canal without destructive bony changes, extending into the meso- and hypo tympanum. Small amount of soft tissue most compatible with cerumen LEFT external auditory canal. Included mastoid aircells are well aerated. SOFT TISSUES: Neck lymphadenopathy. Asymmetric RIGHT preauricular soft tissue swelling in suspected small effusion though sensitivity decreased without contrast. LIMITED INTRACRANIAL: Normal. IMPRESSION: RIGHT periauricular soft tissue swelling/ cellulitis. Soft tissue in RIGHT external auditory canal and internal ear concerning for otitis externus and otitis media without osseous changes. Recommend direct inspection. Moderate chronic maxillary sinusitis. Maxillary molar dental caries. Lymphadenopathy within the included neck, this may be reactive though lymphoproliferative disease has a similar appearance. Electronically Signed   By: Elon Alas M.D.   On: 11/22/2015 02:16   Recent Results (from the past 240 hour(s))  MRSA PCR Screening     Status: Abnormal   Collection Time: 11/21/15 11:00 PM  Result Value Ref Range Status   MRSA  by PCR POSITIVE (A) NEGATIVE Final    Comment:        The GeneXpert MRSA Assay (FDA approved for NASAL specimens only), is one component of a comprehensive MRSA colonization surveillance program. It is not intended to diagnose MRSA  infection nor to guide or monitor treatment for MRSA infections. RESULT CALLED TO, READ BACK BY AND VERIFIED WITH: DAVIS,C RN [id number] AT 0202 SKEEN,P       11/22/2015, 11:28 AM     LOS: 0 days    Records and images were personally review

## 2015-11-22 NOTE — Progress Notes (Signed)
Patient refused cardiac monitoring, stat EKG, Insulin 10 units, Kay exalate.  Tried to convince and explained to the patient the risks but he says he is aware of everything and will not go to dialysis today.  Physician made aware about the situation

## 2015-11-22 NOTE — Progress Notes (Signed)
Patient with HIV has CD4 count of 20 and viral load of 125,000 warranting prophylaxis against opportunistic infections PCP and MAC. Antiviral regimen includes dolutegravir, abacavir, and darunavir-cobicistat. Patient reports allergy to SMX-TMP, the first-line agent for PCP, stating this medication caused kidney injury and subsequent dialysis. He has since refused SMX-TMP and azithromycin as prophylaxis. The AIDSinfo (NIH database) guidelines provide alternatives for opportunistic infections to be utilized in this setting, considering his concern for kidney function and CrCl 9.3 mL/min. Oral PCP prophylaxis to replace SMX-TMP: . Dapsone  o 100mg  daily or 50mg  2 times daily o 50mg  daily + pyrimethamine 50mg  weekly + leucovorin 25mg  weekly o 200mg  weekly + pyrimethamine 75mg  weekly + leucovorin 25mg  weekly o Requires testing for glucose-6-dehydrogenase deficiency o Use pyrimethamine cautiously in renal impairment o Nephrotic syndrome is an adverse reaction of dapsone . Atovaquone o 1500mg  daily o 1500mg  daily + pyrimethamine 25mg  daily + leucovorin 10mg  daily o Major interaction with rifabutin . Aerosolized pentamidine o 300mg  via Respigard II nebulizer monthly Oral MAC prophylaxis to replace azithromycin: . Clarithromycin o 500mg  2 times daily o CrCl <30 mL/min - 250mg  2 times daily or 500mg  daily o Use caution with darunavir-cobicistat o Major interactions regarding CYP3A4 inhibition and QTc prolongation with concomitant diltiazem and ondansetron . Rifabutin o 300mg  daily o Avoid with dapsone if possible; major interactions with atovaquone (avoid), darunavir-cobicistat,  o CrCl <30 mL/min - 50% of dose daily o Requires testing for TB Of note, atovaquone alone and in combination and three-drug regimens including dapsone also address toxoplasmosis prophylaxis, given his CD4 count. If agreeable and appropriate, patient can be initiated on the above agent(s) to prevent opportunistic infections.  His need for prophylaxis can be reevaluated if his CD4 count improves and becomes stable.  Annabelle Harman, PharmD Candidate, [place]

## 2015-11-22 NOTE — Significant Event (Signed)
Pt's potassium 6.1-->6.8 after 30 g Kayexelate at 0846 this AM .  Initially refused dialysis (as documented in initial consult note).  Returned to pt's room to discuss increasing potassium.  Explained explicitly the risk of sudden cardiac death with K+ of 6.8.  He again refused dialysis today, saying "I have had this explained to me 50,000 times, do you think I am going to change my mind now?".  Per nursing staff, he has refused all medical management for the K+ of 6.8 subsequent to the initial dose of Kayexelate at 846 AM .  He is also refusing telemetry monitoring.    Madelon Lips MD [facility]  Cell: [phone]  Pager [phone]

## 2015-11-22 NOTE — Progress Notes (Signed)
Patient also refused continuous Pulse oximetry.

## 2015-11-22 NOTE — Consult Note (Signed)
Reason for Consult: To manage dialysis and dialysis related needs Referring Physician: Dr. Donovan Kail is an 35 y.o. male.  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  He is found to have otitis externa.    We are seeing him for dialysis and dialysis related needs.  He began having ear pain several days ago and this prompted his ED visit.  Because he is immunocompromised vanc/ ceftaz and ciprodex gtts were recommended; however pt declined vanc as he has had nephrotoxicity from that in the past.  He is getting dapto instead.    He normally dialyzes TTS at [facility].  His K+ was found to be 6.1 and then 6.8 on repeat.  Orders written for hemodialysis when K of 6.1 noted; pt refused dialysis today as "it is not his day".  This provider discussed the need for the extra treatment and risks of sudden cardiac death with a K in such a high range.  Pt refused and even refused a shortened treatment today with checking K+ one hour into treatment, saying he has to get himself "mentally prepared" to do dialysis.  Dialyzes at [facility] EDW 64 kg HD 3 hr 45 min Bath 2K/2Ca, Dialyzer F 180, Heparin 5000 u q Rx Access L FA fistula  Past Medical History:  Diagnosis Date  . Asthma   . CAP (community acquired pneumonia) 10/2013  . Cellulitis 11/2013   LLE  . Chronic kidney disease   . DVT (deep venous thrombosis) ([place]) 10/2013   BLE  . Fever   . Headache   . HIV infection ([place]) dx'd 10/2013  . Hypertension   . Leg pain   . Multiple environmental allergies    "trees, dogs, cats"  . Multiple food allergies   . Shingles < 2010  . 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 AV FISTULA PLACEMENT Left 03/03/2015   Procedure: Creation of Left Arm RADIOCEPHALIC ARTERIOVENOUS FISTULA ;  Surgeon: Mal Misty, MD;  Location: [place];  Service: Vascular;  Laterality: Left;   . FEMUR FRACTURE SURGERY Right 09/2001  . FRACTURE SURGERY    . I&D EXTREMITY Right 12/18/2014   Procedure: IRRIGATION AND DEBRIDEMENT RIGHT LOWER EXTREMITY;  Surgeon: Ralene Ok, MD;  Location: [place];  Service: General;  Laterality: Right;  . INSERTION OF DIALYSIS CATHETER  2016    Family History  Problem Relation Age of Onset  . Hypertension Mother   . Diabetes Maternal Grandmother   . Kidney disease Maternal Grandmother     Social History:  reports that he has never smoked. He has never used smokeless tobacco. He reports that he drinks alcohol. He reports that he does not use drugs.  Allergies:  Allergies  Allergen Reactions  . Bactrim [Sulfamethoxazole-Trimethoprim] Other (See Comments)    Renal failure  . Cauliflower [Brassica Oleracea Italica] Anaphylaxis and Other (See Comments)    Pt states his throat closes up  . Coconut Flavor Hives and Shortness Of Breath  . Fish Allergy Hives and Shortness Of Breath    Throat closes up  . Onion Hives and Shortness Of Breath  . Vancomycin Other (See Comments)    Patient states vancomycin caused kidney injury  . Peanut-Containing Drug Products Hives  . Reglan [Metoclopramide] Other (See Comments)    Causes ticks/ jerks  . Amlodipine Palpitations and Other (See  Comments)    fatigue  . Augmentin [Amoxicillin-Pot Clavulanate] Hives  . Banana Other (See Comments)    Per allergy test    Medications: I have reviewed the patient's current medications. Calcitriol 0.5 mcg 3x weekly Mircera 100 mcg q 2 weeks Venofer 50 mg q week  Results for orders placed or performed during the hospital encounter of 11/21/15 (from the past 48 hour(s))  Comprehensive metabolic panel     Status: Abnormal   Collection Time: 11/21/15  8:10 PM  Result Value Ref Range   Sodium 133 (L) 135 - 145 mmol/L   Potassium 5.5 (H) 3.5 - 5.1 mmol/L   Chloride 94 (L) 101 - 111 mmol/L   CO2 27 22 - 32 mmol/L   Glucose, Bld 74 65 - 99 mg/dL   BUN 36 (H) 6 - 20  mg/dL   Creatinine, Ser 9.93 (H) 0.61 - 1.24 mg/dL   Calcium 8.7 (L) 8.9 - 10.3 mg/dL   Total Protein 7.2 6.5 - 8.1 g/dL   Albumin 2.8 (L) 3.5 - 5.0 g/dL   AST 11 (L) 15 - 41 U/L   ALT 10 (L) 17 - 63 U/L   Alkaline Phosphatase 40 38 - 126 U/L   Total Bilirubin 0.6 0.3 - 1.2 mg/dL   GFR calc non Af Amer 6 (L) >60 mL/min   GFR calc Af Amer 7 (L) >60 mL/min    Comment: (NOTE) The eGFR has been calculated using the CKD EPI equation. This calculation has not been validated in all clinical situations. eGFR's persistently <60 mL/min signify possible Chronic Kidney Disease.    Anion gap 12 5 - 15  CBC with Differential     Status: Abnormal   Collection Time: 11/21/15  8:10 PM  Result Value Ref Range   WBC 9.6 4.0 - 10.5 K/uL   RBC 3.34 (L) 4.22 - 5.81 MIL/uL   Hemoglobin 9.6 (L) 13.0 - 17.0 g/dL   HCT 30.8 (L) 39.0 - 52.0 %   MCV 92.2 78.0 - 100.0 fL   MCH 28.7 26.0 - 34.0 pg   MCHC 31.2 30.0 - 36.0 g/dL   RDW 14.9 11.5 - 15.5 %   Platelets 196 150 - 400 K/uL   Neutrophils Relative % 78 %   Neutro Abs 7.5 1.7 - 7.7 K/uL   Lymphocytes Relative 8 %   Lymphs Abs 0.7 0.7 - 4.0 K/uL   Monocytes Relative 8 %   Monocytes Absolute 0.8 0.1 - 1.0 K/uL   Eosinophils Relative 6 %   Eosinophils Absolute 0.6 0.0 - 0.7 K/uL   Basophils Relative 0 %   Basophils Absolute 0.0 0.0 - 0.1 K/uL  I-Stat CG4 Lactic Acid, ED     Status: None   Collection Time: 11/21/15  8:54 PM  Result Value Ref Range   Lactic Acid, Venous 0.89 0.5 - 1.9 mmol/L  MRSA PCR Screening     Status: Abnormal   Collection Time: 11/21/15 11:00 PM  Result Value Ref Range   MRSA by PCR POSITIVE (A) NEGATIVE    Comment:        The GeneXpert MRSA Assay (FDA approved for NASAL specimens only), is one component of a comprehensive MRSA colonization surveillance program. It is not intended to diagnose MRSA infection nor to guide or monitor treatment for MRSA infections. RESULT CALLED TO, READ BACK BY AND VERIFIED  WITH: DAVIS,C RN [id number] AT 0202 SKEEN,P   CBC     Status: Abnormal   Collection Time: 11/22/15 12:33  AM  Result Value Ref Range   WBC 10.1 4.0 - 10.5 K/uL   RBC 3.42 (L) 4.22 - 5.81 MIL/uL   Hemoglobin 9.7 (L) 13.0 - 17.0 g/dL   HCT 31.4 (L) 39.0 - 52.0 %   MCV 91.8 78.0 - 100.0 fL   MCH 28.4 26.0 - 34.0 pg   MCHC 30.9 30.0 - 36.0 g/dL   RDW 15.3 11.5 - 15.5 %   Platelets 198 150 - 400 K/uL  Sedimentation rate     Status: Abnormal   Collection Time: 11/22/15 12:33 AM  Result Value Ref Range   Sed Rate 69 (H) 0 - 16 mm/hr  C-reactive protein     Status: Abnormal   Collection Time: 11/22/15 12:33 AM  Result Value Ref Range   CRP 3.3 (H) <1.0 mg/dL  Renal function panel     Status: Abnormal   Collection Time: 11/22/15 12:33 AM  Result Value Ref Range   Sodium 136 135 - 145 mmol/L   Potassium 6.1 (H) 3.5 - 5.1 mmol/L   Chloride 98 (L) 101 - 111 mmol/L   CO2 29 22 - 32 mmol/L   Glucose, Bld 93 65 - 99 mg/dL   BUN 38 (H) 6 - 20 mg/dL   Creatinine, Ser 9.78 (H) 0.61 - 1.24 mg/dL   Calcium 9.1 8.9 - 10.3 mg/dL   Phosphorus 6.4 (H) 2.5 - 4.6 mg/dL   Albumin 2.7 (L) 3.5 - 5.0 g/dL   GFR calc non Af Amer 6 (L) >60 mL/min   GFR calc Af Amer 7 (L) >60 mL/min    Comment: (NOTE) The eGFR has been calculated using the CKD EPI equation. This calculation has not been validated in all clinical situations. eGFR's persistently <60 mL/min signify possible Chronic Kidney Disease.    Anion gap 9 5 - 15  Basic metabolic panel     Status: Abnormal   Collection Time: 11/22/15 11:51 AM  Result Value Ref Range   Sodium 137 135 - 145 mmol/L   Potassium 6.8 (HH) 3.5 - 5.1 mmol/L    Comment: NO VISIBLE HEMOLYSIS CRITICAL RESULT CALLED TO, READ BACK BY AND VERIFIED WITH: J.NERENDAS,RN 11/22/15 1304 BY BSLADE    Chloride 99 (L) 101 - 111 mmol/L   CO2 27 22 - 32 mmol/L   Glucose, Bld 85 65 - 99 mg/dL   BUN 43 (H) 6 - 20 mg/dL   Creatinine, Ser 11.34 (H) 0.61 - 1.24 mg/dL   Calcium 9.2 8.9 -  10.3 mg/dL   GFR calc non Af Amer 5 (L) >60 mL/min   GFR calc Af Amer 6 (L) >60 mL/min    Comment: (NOTE) The eGFR has been calculated using the CKD EPI equation. This calculation has not been validated in all clinical situations. eGFR's persistently <60 mL/min signify possible Chronic Kidney Disease.    Anion gap 11 5 - 15    Ct Maxillofacial Wo Contrast  Result Date: 11/22/2015 CLINICAL DATA:  Severe teeth pain, bilateral ear pain and fever for 1 week. History of HIV, headache. EXAM: CT MAXILLOFACIAL WITHOUT CONTRAST TECHNIQUE: Multidetector CT imaging of the maxillofacial structures was performed. Multiplanar CT image reconstructions were also generated. A small metallic BB was placed on the right temple in order to reliably differentiate right from left. GFR 7. COMPARISON:  CT HEAD November 05, 2015 FINDINGS: OSSEOUS: The mandible is intact, the condyles are located. No acute facial fracture. No destructive bony lesions. Tooth 1 and 16 dental caries. No additional dental pathology.  ORBITS: Ocular globes and orbital contents are normal. SINUSES: Moderate chronic maxillary sinusitis with mucosal thickening. Remaining paranasal sinuses are well-aerated. Ostiomeatal units are patent. Soft tissue effaces the RIGHT external auditory canal without destructive bony changes, extending into the meso- and hypo tympanum. Small amount of soft tissue most compatible with cerumen LEFT external auditory canal. Included mastoid aircells are well aerated. SOFT TISSUES: Neck lymphadenopathy. Asymmetric RIGHT preauricular soft tissue swelling in suspected small effusion though sensitivity decreased without contrast. LIMITED INTRACRANIAL: Normal. IMPRESSION: RIGHT periauricular soft tissue swelling/ cellulitis. Soft tissue in RIGHT external auditory canal and internal ear concerning for otitis externus and otitis media without osseous changes. Recommend direct inspection. Moderate chronic maxillary sinusitis. Maxillary  molar dental caries. Lymphadenopathy within the included neck, this may be reactive though lymphoproliferative disease has a similar appearance. Electronically Signed   By: Elon Alas M.D.   On: 11/22/2015 02:16    ROS: all other systems reviewed and are negative except as per HPI. Blood pressure (!) 153/88, pulse 96, temperature 100.2 F (37.9 C), temperature source Oral, resp. rate 18, height '5\' 7"'  (1.702 m), weight 68.3 kg (150 lb 9.2 oz), SpO2 96 %.   Assessment/Plan: 1 Hyperkalemia: K 6.1--> 6.8 despite getting Kayexelate.  Discussed risks of refusing dialysis with pt and will continue to do so.   2 ESRD: TTS at [facility].  Will plan for HD today and tomorrow 3 Hypertension: on Cartia 180 mg daily, Coreg 12.5 mg BID, hydral 100 mg TID, losartan 100 mg daily.  Some elevated pressures here likely due to volume as he doesn't get down to his EDW. 4. 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  Not due until October 2nd.  Hgb 10.2 9/21 5. Metabolic Bone Disease: Phos 7.4, Corrected Ca 9.0 (8/29), PTH 636 (8/29).  On Calcitriol 0.5 q treatment, binders are TUMS (400 mg elemental calcium) TID with meals 6.  HIV: on Tivicay and Pezcobix.   7.  Otitis externa: per primary on dapto/ceftaz.  CT pending.  Madelon Lips 11/22/2015, 1:06 PM

## 2015-11-23 LAB — BASIC METABOLIC PANEL
Anion gap: 14 (ref 5–15)
BUN: 55 mg/dL — ABNORMAL HIGH (ref 6–20)
CO2: 25 mmol/L (ref 22–32)
Calcium: 9 mg/dL (ref 8.9–10.3)
Chloride: 96 mmol/L — ABNORMAL LOW (ref 101–111)
Creatinine, Ser: 13.01 mg/dL — ABNORMAL HIGH (ref 0.61–1.24)
GFR calc Af Amer: 5 mL/min — ABNORMAL LOW (ref >60)
GFR calc non Af Amer: 4 mL/min — ABNORMAL LOW (ref >60)
Glucose, Bld: 86 mg/dL (ref 65–99)
Potassium: 5.3 mmol/L — ABNORMAL HIGH (ref 3.5–5.1)
Sodium: 135 mmol/L (ref 135–145)

## 2015-11-23 LAB — RENAL FUNCTION PANEL
Albumin: 2.5 g/dL — ABNORMAL LOW (ref 3.5–5.0)
Anion gap: 12 (ref 5–15)
BUN: 55 mg/dL — ABNORMAL HIGH (ref 6–20)
CO2: 26 mmol/L (ref 22–32)
Calcium: 8.9 mg/dL (ref 8.9–10.3)
Chloride: 97 mmol/L — ABNORMAL LOW (ref 101–111)
Creatinine, Ser: 13.04 mg/dL — ABNORMAL HIGH (ref 0.61–1.24)
GFR calc Af Amer: 5 mL/min — ABNORMAL LOW (ref >60)
GFR calc non Af Amer: 4 mL/min — ABNORMAL LOW (ref >60)
Glucose, Bld: 82 mg/dL (ref 65–99)
Phosphorus: 7.2 mg/dL — ABNORMAL HIGH (ref 2.5–4.6)
Potassium: 5.2 mmol/L — ABNORMAL HIGH (ref 3.5–5.1)
Sodium: 135 mmol/L (ref 135–145)

## 2015-11-23 LAB — CBC
HCT: 28.2 % — ABNORMAL LOW (ref 39.0–52.0)
Hemoglobin: 9 g/dL — ABNORMAL LOW (ref 13.0–17.0)
MCH: 28.9 pg (ref 26.0–34.0)
MCHC: 31.9 g/dL (ref 30.0–36.0)
MCV: 90.7 fL (ref 78.0–100.0)
Platelets: 161 10*3/uL (ref 150–400)
RBC: 3.11 MIL/uL — ABNORMAL LOW (ref 4.22–5.81)
RDW: 15.4 % (ref 11.5–15.5)
WBC: 8.5 10*3/uL (ref 4.0–10.5)

## 2015-11-23 LAB — CD4/CD8 (T-HELPER/T-SUPPRESSOR CELL)
CD4 absolute: 30 /uL — ABNORMAL LOW (ref 500–1900)
CD4%: 4 % — ABNORMAL LOW (ref 30.0–60.0)
CD8 T Cell Abs: 430 /uL (ref 230–1000)
CD8tox: 57 % — ABNORMAL HIGH (ref 15.0–40.0)
Ratio: 0.07 — ABNORMAL LOW (ref 1.0–3.0)
Total lymphocyte count: 761 /uL — ABNORMAL LOW (ref 1000–4000)

## 2015-11-23 LAB — HEPATITIS B SURFACE ANTIGEN: Hepatitis B Surface Ag: NEGATIVE

## 2015-11-23 MED ORDER — CALCITRIOL 0.5 MCG PO CAPS
ORAL_CAPSULE | ORAL | Status: AC
  Administered 2015-11-23: 0.5 ug
  Filled 2015-11-23: qty 1

## 2015-11-23 MED ORDER — ALBUTEROL SULFATE (2.5 MG/3ML) 0.083% IN NEBU: 2.5000 mg | INHALATION_SOLUTION | Freq: Once | RESPIRATORY_TRACT | Status: DC

## 2015-11-23 NOTE — Progress Notes (Signed)
   Subjective: Patient was evaluated this morning on rounds. He was in dialysis. He states that his right ear pain has decreased however continues to have difficulty hearing in that ear. He complains of some shortness of breath that started this morning. He denies any chest pain.  Objective:  Vital signs in last 24 hours: Vitals:   11/23/15 0930 11/23/15 1000 11/23/15 1030 11/23/15 1100  BP: (!) 165/84 (!) 160/95 (!) 169/92 (!) 161/90  Pulse: (!) 104 (!) 104 (!) 104 (!) 104  Resp:      Temp:      TempSrc:      SpO2:      Weight:      Height:       Physical Exam  Constitutional: No distress.  Speaking in complete sentences  Cardiovascular: Normal rate, regular rhythm and normal heart sounds.  Exam reveals no gallop and no friction rub.   No murmur heard. Pulmonary/Chest: Effort normal and breath sounds normal. No respiratory distress. He has no wheezes. He has no rales.  Abdominal: Soft. He exhibits no distension. There is no tenderness.  Neurological: He is alert.  Skin: Skin is warm and dry.     Assessment/Plan:  Active Problems:   Otitis externa  Severe Otitis Externa Classified as severe as patient is immunocompromised with poorly controlled AIDS. Patient is on Ceftazidime and Ciprodex drops.  Right ear pain has decreased the patient complains of difficulty hearing in his right ear. This is likely due to fluid accumulation. A wick placement may help with antibiotic distribution.    - Consult ENT for wick placement -Ceftazidime  -Ciprodex drops  -Culture of ear drainage pending -ID following, we appreciate recommendations  Hyperkalemia K 5.3 this morning. Patient is in dialysis for his regular scheduled treatment. -BMET -Holding losartan  AIDS Patient reports allergy to SMX-TMP and refuses azithromycin for MAC prophylaxis.  Appreciate pharmacy's recommendations on alternative prophylaxis treatments for PCP and MAC.  Will need to consult with ID for their preference  on these recommendations. -Continue Prezcobix, Abacavir and Tivicay  ESRD on HD Patient with HIVAN on HD since Dec 2015 on TTS schedule.  -Nephrology following, we appreciate recommendations  HTN Blood pressure 162/94, receiving dialysis today and should improve. Patient is on Coreg 12.5 mg BID, Cardizem 180 mg Daily, hydralazine 25 mg TID, and losartan 100mg  daily for BP control. -Holding losartan -Continue Coreg 12.5 mg BID, Cardizem 180 mg Daily, hydralazine 25 mg TID  H/o seizure Patient follows with Neurology. -Continue Keppra 500 mg SMWF, 250mg  TTS  DVT/PE ppx: Heparin TID FEN: Renal CODE: FULL  Dispo: Anticipated discharge in approximately 1 day(s).   Valinda Party, DO 11/23/2015, 11:24 AM Pager: [phone]

## 2015-11-23 NOTE — Progress Notes (Signed)
[facility] Progress Note   Assessment/ Plan:   Assessment/Plan: 1 Hyperkalemia: K 5.3 this AM and dialysis will correct this 2 ESRD: TTS at [facility].  refused dialysis for hyperkalemia 9/25; plan for HD today 9/26 3 Hypertension: on Cartia 180 mg daily, Coreg 12.5 mg BID, hydral 100 mg TID, losartan 100 mg daily.  Some elevated pressures here likely due to volume as he doesn't get down to his EDW.  Pre-wt 71 kg 9/26, will continue to try and chip away to get down to EDW of 64 (pt thinks his EDW is 67 but I am doubtful as he is having some mild SOB this AM consistent with fluid overload) 4. 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  Not due until October 2nd.  Hgb 10.2 9/21; suspect a dilutional component so will eval after dialysis, Hgb 9.0 5. Metabolic Bone Disease: Phos 7.4, Corrected Ca 9.0 (8/29), PTH 636 (8/29).  On Calcitriol 0.5 q treatment, binders are TUMS (400 mg elemental calcium) TID with meals 6.  HIV: on Tivicay, abacavir, and Pezcobix.  ID following.  Cd4 ct 20 and VL 125,000 7.  Otitis externa: per primary on dapto/ceftaz/ ciprodex gtts.  CT confirming R otitis externa/ media and possible reactive LAD without mastoiditis.  ID following  Subjective:    Alert and oriented this AM . Potassium 5.3 this morning.     Objective:   BP (!) 156/87   Pulse 100   Temp 98.5 F (36.9 C) (Oral)   Resp 16   Ht 5\' 7"  (1.702 m)   Wt 71.8 kg (158 lb 4.6 oz)   SpO2 98%   BMI 24.79 kg/m   Physical Exam: Gen:NAD Resp:nl WOB ABD: nondistended PYK:DXIPJAS to have 1+ LE edema  Pt has declined a complete physical examination  Dialysis orders: Dialyzes at [facility] EDW 64 kg HD 3 hr 45 min Bath 2K/2Ca, Dialyzer F 180, Heparin 5000 u q Rx Access L FA fistula Calcitriol 0.5 mcg 3x weekly Mircera 100 mcg q 2 weeks (next due 10/2) Venofer 50 mg q week   Labs: BMET  Recent Labs Lab 11/21/15 2010 11/22/15 0033 11/22/15 1151 11/23/15 0629 11/23/15 0739  NA 133* 136 137  135 135  K 5.5* 6.1* 6.8* 5.3* 5.2*  CL 94* 98* 99* 96* 97*  CO2 27 29 27 25 26   GLUCOSE 74 93 85 86 82  BUN 36* 38* 43* 55* 55*  CREATININE 9.93* 9.78* 11.34* 13.01* 13.04*  CALCIUM 8.7* 9.1 9.2 9.0 8.9  PHOS  --  6.4*  --   --  7.2*   CBC  Recent Labs Lab 11/21/15 2010 11/22/15 0033 11/23/15 0734  WBC 9.6 10.1 8.5  NEUTROABS 7.5  --   --   HGB 9.6* 9.7* 9.0*  HCT 30.8* 31.4* 28.2*  MCV 92.2 91.8 90.7  PLT 196 198 161    @IMGRELPRIORS @ Medications:    . abacavir  600 mg Oral Q24H  . albuterol  10 mg Nebulization Once  . albuterol  2.5 mg Nebulization Once  . calcitRIOL  0.5 mcg Oral Q T,Th,Sa-HD  . calcium carbonate  2 tablet Oral TID WC  . carvedilol  12.5 mg Oral BID WC  . cefTAZidime (FORTAZ)  IV  1 g Intravenous Q24H  . Chlorhexidine Gluconate Cloth  6 each Topical Q0600  . ciprofloxacin-dexamethasone  4 drop Both Ears BID  . darunavir-cobicistat  1 tablet Oral Q24H  . dextrose  1 ampule Intravenous Once  . diltiazem  180 mg Oral Daily  . dolutegravir  50 mg Oral Q24H  . [START ON 11/25/2015] ferric gluconate (FERRLECIT/NULECIT) IV  62.5 mg Intravenous Q Thu-HD  . heparin  5,000 Units Subcutaneous Q8H  . hydrALAZINE  100 mg Oral 3 times per day  . insulin aspart  10 Units Intravenous Once  . levETIRAcetam  250 mg Oral Once per day on Tue Thu Sat  . levETIRAcetam  500 mg Oral Once per day on Sun Mon Wed Fri  . mupirocin ointment  1 application Nasal BID  . sodium polystyrene  30 g Oral Once  . sucroferric oxyhydroxide  500 mg Oral TID WC     Madelon Lips MD [facility] Cell: [phone]  Pgr: [phone] 11/23/2015, 8:18 AM

## 2015-11-23 NOTE — Progress Notes (Signed)
Pharmacy Antibiotic Note  Jim Wyatt is a 35 y.o. male admitted on 11/21/2015 with HIV/AIDS/otits externa.  Pharmacy has been consulted for vancomycin/ceftazidime dosing.   ESRD on HD TTS - last on 9/23, currently in HD. Of note, per nephrology would benefit from extra HD session to reduce weight as he is ~7kg above EDW, however patient is currently refusing.  Patient also refuses opportunistic infection prophylaxis- please see detailed note from Annabelle Harman, PharmD candidate written on 9/25 for more details regarding opportunistic infection prophylaxis options.  Plan: -Ceftazidime 1g IV q24h- continue this regimen as possibility for extra HD sessions -Monitor clinical progress, c/s, HD schedule/tolerance, abx LOT   Height: 5\' 7"  (170.2 cm) Weight: 158 lb 4.6 oz (71.8 kg) IBW/kg (Calculated) : 66.1  Temp (24hrs), Avg:99.3 F (37.4 C), Min:98.5 F (36.9 C), Max:100.2 F (37.9 C)   Recent Labs Lab 11/21/15 2010 11/21/15 2054 11/22/15 0033 11/22/15 1151 11/23/15 0629 11/23/15 0734 11/23/15 0739  WBC 9.6  --  10.1  --   --  8.5  --   CREATININE 9.93*  --  9.78* 11.34* 13.01*  --  13.04*  LATICACIDVEN  --  0.89  --   --   --   --   --     Estimated Creatinine Clearance: 7.6 mL/min (by C-G formula based on SCr of 13.04 mg/dL (H)).    Allergies  Allergen Reactions  . Bactrim [Sulfamethoxazole-Trimethoprim] Other (See Comments)    Renal failure  . Cauliflower [Brassica Oleracea Italica] Anaphylaxis and Other (See Comments)    Pt states his throat closes up  . Coconut Flavor Hives and Shortness Of Breath  . Fish Allergy Hives and Shortness Of Breath    Throat closes up  . Onion Hives and Shortness Of Breath  . Vancomycin Other (See Comments)    Patient states vancomycin caused kidney injury  . Peanut-Containing Drug Products Hives  . Reglan [Metoclopramide] Other (See Comments)    Causes ticks/ jerks  . Amlodipine Palpitations and Other (See Comments)    fatigue   . Augmentin [Amoxicillin-Pot Clavulanate] Hives  . Banana Other (See Comments)    Per allergy test    Antimicrobials this admission: Dapto 9/24 x1 Ceftaz 9/24 >>   Dose adjustments this admission: n/a  Microbiology results: 9/24 MRSA PCR: pos 9/24 ear cx: sent  Lamoine Fredricksen D. Suriyah Vergara, PharmD, BCPS Clinical Pharmacist Pager: [phone] 11/23/2015 8:38 AM

## 2015-11-23 NOTE — Procedures (Signed)
Patient seen on Hemodialysis. QB 400 L FA AVF, UF goal 3L .  He is significantly above his EDW of 64 kg at 71 kg.  He would benefit from an extra treatment but he has declined extra treatments previously and is not amenable to an extra session.     Treatment adjusted as needed.  Madelon Lips Cell [phone] Pager # [phone] 8:16 AM

## 2015-11-23 NOTE — Consult Note (Signed)
Reason for Consult:right ear pain Referring Physician: Wilmar Prabhakar is an 35 y.o. male.  HPI: hx of right ear pain since Sunday. He has not had ear issues previously. He had no manipulation of the ear. 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 Some drainage. He has been on abx and ciprodex. No facial weakness. Preauricular swelling and pain with movement of the ear.   Past Medical History:  Diagnosis Date  . Asthma   . CAP (community acquired pneumonia) 10/2013  . Cellulitis 11/2013   LLE  . Chronic kidney disease   . DVT (deep venous thrombosis) ([place]) 10/2013   BLE  . Fever   . Headache   . HIV infection ([place]) dx'd 10/2013  . Hypertension   . Leg pain   . Multiple environmental allergies    "trees, dogs, cats"  . Multiple food allergies   . Shingles < 2010  . 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 AV FISTULA PLACEMENT Left 03/03/2015   Procedure: Creation of Left Arm RADIOCEPHALIC ARTERIOVENOUS FISTULA ;  Surgeon: Mal Misty, MD;  Location: [facility];  Service: Vascular;  Laterality: Left;  . FEMUR FRACTURE SURGERY Right 09/2001  . FRACTURE SURGERY    . I&D EXTREMITY Right 12/18/2014   Procedure: IRRIGATION AND DEBRIDEMENT RIGHT LOWER EXTREMITY;  Surgeon: Ralene Ok, MD;  Location: [facility];  Service: General;  Laterality: Right;  . INSERTION OF DIALYSIS CATHETER  2016    Family History  Problem Relation Age of Onset  . Hypertension Mother   . Diabetes Maternal Grandmother   . Kidney disease Maternal Grandmother     Social History:  reports that he has never smoked. He has never used smokeless tobacco. He reports that he drinks alcohol. He reports that he does not use drugs.  Allergies:  Allergies  Allergen Reactions  . Bactrim [Sulfamethoxazole-Trimethoprim] Other (See Comments)    Renal failure  . Cauliflower [Brassica Oleracea Italica] Anaphylaxis and Other (See Comments)    Pt states his  throat closes up  . Coconut Flavor Hives and Shortness Of Breath  . Fish Allergy Hives and Shortness Of Breath    Throat closes up  . Onion Hives and Shortness Of Breath  . Vancomycin Other (See Comments)    Patient states vancomycin caused kidney injury  . Peanut-Containing Drug Products Hives  . Reglan [Metoclopramide] Other (See Comments)    Causes ticks/ jerks  . Amlodipine Palpitations and Other (See Comments)    fatigue  . Augmentin [Amoxicillin-Pot Clavulanate] Hives  . Banana Other (See Comments)    Per allergy test    Medications: I have reviewed the patient's current medications.  Results for orders placed or performed during the hospital encounter of 11/21/15 (from the past 48 hour(s))  Comprehensive metabolic panel     Status: Abnormal   Collection Time: 11/21/15  8:10 PM  Result Value Ref Range   Sodium 133 (L) 135 - 145 mmol/L   Potassium 5.5 (H) 3.5 - 5.1 mmol/L   Chloride 94 (L) 101 - 111 mmol/L   CO2 27 22 - 32 mmol/L   Glucose, Bld 74 65 - 99 mg/dL   BUN 36 (H) 6 - 20 mg/dL   Creatinine, Ser 9.93 (H) 0.61 - 1.24 mg/dL   Calcium 8.7 (L) 8.9 - 10.3 mg/dL   Total Protein 7.2 6.5 - 8.1 g/dL   Albumin 2.8 (L) 3.5 - 5.0 g/dL   AST  11 (L) 15 - 41 U/L   ALT 10 (L) 17 - 63 U/L   Alkaline Phosphatase 40 38 - 126 U/L   Total Bilirubin 0.6 0.3 - 1.2 mg/dL   GFR calc non Af Amer 6 (L) >60 mL/min   GFR calc Af Amer 7 (L) >60 mL/min    Comment: (NOTE) The eGFR has been calculated using the CKD EPI equation. This calculation has not been validated in all clinical situations. eGFR's persistently <60 mL/min signify possible Chronic Kidney Disease.    Anion gap 12 5 - 15  CBC with Differential     Status: Abnormal   Collection Time: 11/21/15  8:10 PM  Result Value Ref Range   WBC 9.6 4.0 - 10.5 K/uL   RBC 3.34 (L) 4.22 - 5.81 MIL/uL   Hemoglobin 9.6 (L) 13.0 - 17.0 g/dL   HCT 30.8 (L) 39.0 - 52.0 %   MCV 92.2 78.0 - 100.0 fL   MCH 28.7 26.0 - 34.0 pg   MCHC 31.2  30.0 - 36.0 g/dL   RDW 14.9 11.5 - 15.5 %   Platelets 196 150 - 400 K/uL   Neutrophils Relative % 78 %   Neutro Abs 7.5 1.7 - 7.7 K/uL   Lymphocytes Relative 8 %   Lymphs Abs 0.7 0.7 - 4.0 K/uL   Monocytes Relative 8 %   Monocytes Absolute 0.8 0.1 - 1.0 K/uL   Eosinophils Relative 6 %   Eosinophils Absolute 0.6 0.0 - 0.7 K/uL   Basophils Relative 0 %   Basophils Absolute 0.0 0.0 - 0.1 K/uL  I-Stat CG4 Lactic Acid, ED     Status: None   Collection Time: 11/21/15  8:54 PM  Result Value Ref Range   Lactic Acid, Venous 0.89 0.5 - 1.9 mmol/L  MRSA PCR Screening     Status: Abnormal   Collection Time: 11/21/15 11:00 PM  Result Value Ref Range   MRSA by PCR POSITIVE (A) NEGATIVE    Comment:        The GeneXpert MRSA Assay (FDA approved for NASAL specimens only), is one component of a comprehensive MRSA colonization surveillance program. It is not intended to diagnose MRSA infection nor to guide or monitor treatment for MRSA infections. RESULT CALLED TO, READ BACK BY AND VERIFIED WITH: DAVIS,C RN [phone] AT 0202 SKEEN,P   Ear culture     Status: None (Preliminary result)   Collection Time: 11/21/15 11:16 PM  Result Value Ref Range   Specimen Description EAR    Special Requests Immunocompromised    Culture CULTURE REINCUBATED FOR BETTER GROWTH    Report Status PENDING   CBC     Status: Abnormal   Collection Time: 11/22/15 12:33 AM  Result Value Ref Range   WBC 10.1 4.0 - 10.5 K/uL   RBC 3.42 (L) 4.22 - 5.81 MIL/uL   Hemoglobin 9.7 (L) 13.0 - 17.0 g/dL   HCT 31.4 (L) 39.0 - 52.0 %   MCV 91.8 78.0 - 100.0 fL   MCH 28.4 26.0 - 34.0 pg   MCHC 30.9 30.0 - 36.0 g/dL   RDW 15.3 11.5 - 15.5 %   Platelets 198 150 - 400 K/uL  Sedimentation rate     Status: Abnormal   Collection Time: 11/22/15 12:33 AM  Result Value Ref Range   Sed Rate 69 (H) 0 - 16 mm/hr  C-reactive protein     Status: Abnormal   Collection Time: 11/22/15 12:33 AM  Result Value Ref Range  CRP 3.3 (H) <1.0  mg/dL  Renal function panel     Status: Abnormal   Collection Time: 11/22/15 12:33 AM  Result Value Ref Range   Sodium 136 135 - 145 mmol/L   Potassium 6.1 (H) 3.5 - 5.1 mmol/L   Chloride 98 (L) 101 - 111 mmol/L   CO2 29 22 - 32 mmol/L   Glucose, Bld 93 65 - 99 mg/dL   BUN 38 (H) 6 - 20 mg/dL   Creatinine, Ser 9.78 (H) 0.61 - 1.24 mg/dL   Calcium 9.1 8.9 - 10.3 mg/dL   Phosphorus 6.4 (H) 2.5 - 4.6 mg/dL   Albumin 2.7 (L) 3.5 - 5.0 g/dL   GFR calc non Af Amer 6 (L) >60 mL/min   GFR calc Af Amer 7 (L) >60 mL/min    Comment: (NOTE) The eGFR has been calculated using the CKD EPI equation. This calculation has not been validated in all clinical situations. eGFR's persistently <60 mL/min signify possible Chronic Kidney Disease.    Anion gap 9 5 - 15  Basic metabolic panel     Status: Abnormal   Collection Time: 11/22/15 11:51 AM  Result Value Ref Range   Sodium 137 135 - 145 mmol/L   Potassium 6.8 (HH) 3.5 - 5.1 mmol/L    Comment: NO VISIBLE HEMOLYSIS CRITICAL RESULT CALLED TO, READ BACK BY AND VERIFIED WITH: J.NERENDAS,RN 11/22/15 1304 BY BSLADE    Chloride 99 (L) 101 - 111 mmol/L   CO2 27 22 - 32 mmol/L   Glucose, Bld 85 65 - 99 mg/dL   BUN 43 (H) 6 - 20 mg/dL   Creatinine, Ser 11.34 (H) 0.61 - 1.24 mg/dL   Calcium 9.2 8.9 - 10.3 mg/dL   GFR calc non Af Amer 5 (L) >60 mL/min   GFR calc Af Amer 6 (L) >60 mL/min    Comment: (NOTE) The eGFR has been calculated using the CKD EPI equation. This calculation has not been validated in all clinical situations. eGFR's persistently <60 mL/min signify possible Chronic Kidney Disease.    Anion gap 11 5 - 15  Cd4/cd8 (t-helper/t-suppressor cell)     Status: Abnormal   Collection Time: 11/22/15  4:18 PM  Result Value Ref Range   Total lymphocyte count 761 (L) 1,000 - 4,000 /uL   CD4% 4 (L) 30.0 - 60.0 %   CD4 absolute 30 (L) 500 - 1,900 /uL   CD8tox 57 (H) 15.0 - 40.0 %   CD8 T Cell Abs 430 230 - 1,000 /uL   Ratio 0.07 (L) 1.0 -  3.0    Comment: Performed at [facility] metabolic panel     Status: Abnormal   Collection Time: 11/23/15  6:29 AM  Result Value Ref Range   Sodium 135 135 - 145 mmol/L   Potassium 5.3 (H) 3.5 - 5.1 mmol/L   Chloride 96 (L) 101 - 111 mmol/L   CO2 25 22 - 32 mmol/L   Glucose, Bld 86 65 - 99 mg/dL   BUN 55 (H) 6 - 20 mg/dL   Creatinine, Ser 13.01 (H) 0.61 - 1.24 mg/dL   Calcium 9.0 8.9 - 10.3 mg/dL   GFR calc non Af Amer 4 (L) >60 mL/min   GFR calc Af Amer 5 (L) >60 mL/min    Comment: (NOTE) The eGFR has been calculated using the CKD EPI equation. This calculation has not been validated in all clinical situations. eGFR's persistently <60 mL/min signify possible Chronic Kidney Disease.  Anion gap 14 5 - 15  CBC     Status: Abnormal   Collection Time: 11/23/15  7:34 AM  Result Value Ref Range   WBC 8.5 4.0 - 10.5 K/uL   RBC 3.11 (L) 4.22 - 5.81 MIL/uL   Hemoglobin 9.0 (L) 13.0 - 17.0 g/dL   HCT 28.2 (L) 39.0 - 52.0 %   MCV 90.7 78.0 - 100.0 fL   MCH 28.9 26.0 - 34.0 pg   MCHC 31.9 30.0 - 36.0 g/dL   RDW 15.4 11.5 - 15.5 %   Platelets 161 150 - 400 K/uL  Renal function panel     Status: Abnormal   Collection Time: 11/23/15  7:39 AM  Result Value Ref Range   Sodium 135 135 - 145 mmol/L   Potassium 5.2 (H) 3.5 - 5.1 mmol/L   Chloride 97 (L) 101 - 111 mmol/L   CO2 26 22 - 32 mmol/L   Glucose, Bld 82 65 - 99 mg/dL   BUN 55 (H) 6 - 20 mg/dL   Creatinine, Ser 13.04 (H) 0.61 - 1.24 mg/dL   Calcium 8.9 8.9 - 10.3 mg/dL   Phosphorus 7.2 (H) 2.5 - 4.6 mg/dL   Albumin 2.5 (L) 3.5 - 5.0 g/dL   GFR calc non Af Amer 4 (L) >60 mL/min   GFR calc Af Amer 5 (L) >60 mL/min    Comment: (NOTE) The eGFR has been calculated using the CKD EPI equation. This calculation has not been validated in all clinical situations. eGFR's persistently <60 mL/min signify possible Chronic Kidney Disease.    Anion gap 12 5 - 15  Hepatitis B surface antigen     Status: None    Collection Time: 11/23/15  7:40 AM  Result Value Ref Range   Hepatitis B Surface Ag Negative Negative    Comment: (NOTE) A courtesy copy of this report has been sent to [facility], [phone]. STAT results faxed to [phone] also to [phone]  November 23, 2015  3:05pm Called to [facility] Performed At: [facility] [place], [place] [id number] Lindon Romp MD LP:[id number]     Ct Maxillofacial Wo Contrast  Result Date: 11/22/2015 CLINICAL DATA:  Severe teeth pain, bilateral ear pain and fever for 1 week. History of HIV, headache. EXAM: CT MAXILLOFACIAL WITHOUT CONTRAST TECHNIQUE: Multidetector CT imaging of the maxillofacial structures was performed. Multiplanar CT image reconstructions were also generated. A small metallic BB was placed on the right temple in order to reliably differentiate right from left. GFR 7. COMPARISON:  CT HEAD November 05, 2015 FINDINGS: OSSEOUS: The mandible is intact, the condyles are located. No acute facial fracture. No destructive bony lesions. Tooth 1 and 16 dental caries. No additional dental pathology. ORBITS: Ocular globes and orbital contents are normal. SINUSES: Moderate chronic maxillary sinusitis with mucosal thickening. Remaining paranasal sinuses are well-aerated. Ostiomeatal units are patent. Soft tissue effaces the RIGHT external auditory canal without destructive bony changes, extending into the meso- and hypo tympanum. Small amount of soft tissue most compatible with cerumen LEFT external auditory canal. Included mastoid aircells are well aerated. SOFT TISSUES: Neck lymphadenopathy. Asymmetric RIGHT preauricular soft tissue swelling in suspected small effusion though sensitivity decreased without contrast. LIMITED INTRACRANIAL: Normal. IMPRESSION: RIGHT periauricular soft tissue swelling/ cellulitis. Soft tissue in RIGHT external auditory canal and internal ear concerning for otitis externus and otitis media  without osseous changes. Recommend direct inspection. Moderate chronic maxillary sinusitis. Maxillary molar dental caries. Lymphadenopathy within the included neck, this may  be reactive though lymphoproliferative disease has a similar appearance. Electronically Signed   By: Elon Alas M.D.   On: 11/22/2015 02:16    ROS Blood pressure (!) 163/104, pulse (!) 113, temperature 98.9 F (37.2 C), temperature source Oral, resp. rate 16, height '5\' 7"'  (1.702 m), weight 68.2 kg (150 lb 5.7 oz), SpO2 99 %. Physical Exam  Constitutional: He appears well-developed and well-nourished.  HENT:  Head: Normocephalic and atraumatic.  Nose: Nose normal.  Mouth/Throat: Oropharynx is clear and moist.  Left ear normal. Right ear has drainage from the canal. The ear is not swollen but is tender to movement of the pinna. The canal had a brown material that was soft in canal. Culture sent. Suctioned out the debris with suction and headlight. I used the otoscope after to see the TM was intact and did not look like there was fluid in the middle ear and the TM was not inflammed. His hearing was much better after the cleaning and pain better.  Eyes: Conjunctivae are normal. Pupils are equal, round, and reactive to light.  Neck: Normal range of motion. Neck supple.  Neurological: No cranial nerve deficit.    Assessment/Plan: Right otitis externa- he has significant debris cleaned out of the ear with suction. Culture was taken. He had immediate improvement of the hearing after cleaning. He likely has OE but also some cerumen impaction. I would continue the drops as I think he will be much better now. Check culture for MRSA before changing abx. Strict water precuations.   Melissa Montane 11/23/2015, 4:28 PM

## 2015-11-23 NOTE — Progress Notes (Addendum)
INFECTIOUS DISEASE PROGRESS NOTE  ID: Jim Wyatt is a 35 y.o. male with  Active Problems:   Otitis externa  Subjective:  Currently in dialysis.  Continues to have right ear tenderness and diminished hearing.     Abtx:  Anti-infectives    Start     Dose/Rate Route Frequency Ordered Stop   11/22/15 1800  abacavir (ZIAGEN) tablet 600 mg     600 mg Oral Every 24 hours 11/22/15 0932     11/22/15 1800  darunavir-cobicistat (PREZCOBIX) 800-150 MG per tablet 1 tablet     1 tablet Oral Every 24 hours 11/22/15 0932     11/22/15 1800  dolutegravir (TIVICAY) tablet 50 mg     50 mg Oral Every 24 hours 11/22/15 0932     11/22/15 1530  DAPTOmycin (CUBICIN) 273 mg in sodium chloride 0.9 % IVPB  Status:  Discontinued     4 mg/kg  68.3 kg 210.9 mL/hr over 30 Minutes Intravenous  Once 11/22/15 1446 11/22/15 1546   11/22/15 1000  abacavir (ZIAGEN) tablet 600 mg  Status:  Discontinued     600 mg Oral Daily 11/21/15 2242 11/22/15 0932   11/22/15 1000  dolutegravir (TIVICAY) tablet 50 mg  Status:  Discontinued     50 mg Oral Daily 11/21/15 2242 11/22/15 0932   11/22/15 0800  darunavir-cobicistat (PREZCOBIX) 800-150 MG per tablet 1 tablet  Status:  Discontinued     1 tablet Oral Daily with breakfast 11/21/15 2242 11/22/15 0932   11/21/15 2315  DAPTOmycin (CUBICIN) 273 mg in sodium chloride 0.9 % IVPB     4 mg/kg  68.3 kg 210.9 mL/hr over 30 Minutes Intravenous  Once 11/21/15 2307 11/22/15 0136   11/21/15 2145  vancomycin (VANCOCIN) 1,500 mg in sodium chloride 0.9 % 500 mL IVPB  Status:  Discontinued     1,500 mg 250 mL/hr over 120 Minutes Intravenous  Once 11/21/15 2140 11/21/15 2247   11/21/15 2145  cefTAZidime (FORTAZ) 1 g in dextrose 5 % 50 mL IVPB     1 g 100 mL/hr over 30 Minutes Intravenous Every 24 hours 11/21/15 2140        Medications:  Scheduled: . abacavir  600 mg Oral Q24H  . albuterol  10 mg Nebulization Once  . albuterol  2.5 mg Nebulization Once  . calcitRIOL      .  calcitRIOL  0.5 mcg Oral Q T,Th,Sa-HD  . calcium carbonate  2 tablet Oral TID WC  . carvedilol  12.5 mg Oral BID WC  . cefTAZidime (FORTAZ)  IV  1 g Intravenous Q24H  . Chlorhexidine Gluconate Cloth  6 each Topical Q0600  . ciprofloxacin-dexamethasone  4 drop Both Ears BID  . darunavir-cobicistat  1 tablet Oral Q24H  . dextrose  1 ampule Intravenous Once  . diltiazem  180 mg Oral Daily  . dolutegravir  50 mg Oral Q24H  . [START ON 11/25/2015] ferric gluconate (FERRLECIT/NULECIT) IV  62.5 mg Intravenous Q Thu-HD  . heparin  5,000 Units Subcutaneous Q8H  . hydrALAZINE  100 mg Oral 3 times per day  . insulin aspart  10 Units Intravenous Once  . levETIRAcetam  250 mg Oral Once per day on Tue Thu Sat  . levETIRAcetam  500 mg Oral Once per day on Sun Mon Wed Fri  . mupirocin ointment  1 application Nasal BID  . sodium polystyrene  30 g Oral Once  . sucroferric oxyhydroxide  500 mg Oral TID WC    Objective:  Vital signs in last 24 hours: Temp:  [98.5 F (36.9 C)-100.2 F (37.9 C)] 98.5 F (36.9 C) (09/26 0715) Pulse Rate:  [94-106] 104 (09/26 1100) Resp:  [16-18] 16 (09/26 0715) BP: (153-170)/(81-109) 161/90 (09/26 1100) SpO2:  [94 %-99 %] 98 % (09/26 0715) Weight:  [71.8 kg (158 lb 4.6 oz)] 71.8 kg (158 lb 4.6 oz) (09/26 0715)   General appearance: alert and no distress Ears: mild right periauricular swelling and tenderness with discharge. Resp: clear to auscultation bilaterally Cardio: regular rate and rhythm Extremities: extremities normal, atraumatic, no cyanosis or edema and Left forearm AVF- accessed in HD Neurologic: Mental status: Alert, oriented, thought content appropriate  Lab Results  Recent Labs  11/22/15 0033  11/23/15 0629 11/23/15 0734 11/23/15 0739  WBC 10.1  --   --  8.5  --   HGB 9.7*  --   --  9.0*  --   HCT 31.4*  --   --  28.2*  --   NA 136  < > 135  --  135  K 6.1*  < > 5.3*  --  5.2*  CL 98*  < > 96*  --  97*  CO2 29  < > 25  --  26  BUN 38*  < >  55*  --  55*  CREATININE 9.78*  < > 13.01*  --  13.04*  < > = values in this interval not displayed. Liver Panel  Recent Labs  11/21/15 2010 11/22/15 0033 11/23/15 0739  PROT 7.2  --   --   ALBUMIN 2.8* 2.7* 2.5*  AST 11*  --   --   ALT 10*  --   --   ALKPHOS 40  --   --   BILITOT 0.6  --   --    Sedimentation Rate  Recent Labs  11/22/15 0033  ESRSEDRATE 69*   C-Reactive Protein  Recent Labs  11/22/15 0033  CRP 3.3*    Microbiology: Recent Results (from the past 240 hour(s))  MRSA PCR Screening     Status: Abnormal   Collection Time: 11/21/15 11:00 PM  Result Value Ref Range Status   MRSA by PCR POSITIVE (A) NEGATIVE Final    Comment:        The GeneXpert MRSA Assay (FDA approved for NASAL specimens only), is one component of a comprehensive MRSA colonization surveillance program. It is not intended to diagnose MRSA infection nor to guide or monitor treatment for MRSA infections. RESULT CALLED TO, READ BACK BY AND VERIFIED WITH: DAVIS,C RN [id number] AT 0202 SKEEN,P     Studies/Results: Ct Maxillofacial Wo Contrast  Result Date: 11/22/2015 CLINICAL DATA:  Severe teeth pain, bilateral ear pain and fever for 1 week. History of HIV, headache. EXAM: CT MAXILLOFACIAL WITHOUT CONTRAST TECHNIQUE: Multidetector CT imaging of the maxillofacial structures was performed. Multiplanar CT image reconstructions were also generated. A small metallic BB was placed on the right temple in order to reliably differentiate right from left. GFR 7. COMPARISON:  CT HEAD November 05, 2015 FINDINGS: OSSEOUS: The mandible is intact, the condyles are located. No acute facial fracture. No destructive bony lesions. Tooth 1 and 16 dental caries. No additional dental pathology. ORBITS: Ocular globes and orbital contents are normal. SINUSES: Moderate chronic maxillary sinusitis with mucosal thickening. Remaining paranasal sinuses are well-aerated. Ostiomeatal units are patent. Soft tissue effaces  the RIGHT external auditory canal without destructive bony changes, extending into the meso- and hypo tympanum. Small amount of soft tissue most compatible  with cerumen LEFT external auditory canal. Included mastoid aircells are well aerated. SOFT TISSUES: Neck lymphadenopathy. Asymmetric RIGHT preauricular soft tissue swelling in suspected small effusion though sensitivity decreased without contrast. LIMITED INTRACRANIAL: Normal. IMPRESSION: RIGHT periauricular soft tissue swelling/ cellulitis. Soft tissue in RIGHT external auditory canal and internal ear concerning for otitis externus and otitis media without osseous changes. Recommend direct inspection. Moderate chronic maxillary sinusitis. Maxillary molar dental caries. Lymphadenopathy within the included neck, this may be reactive though lymphoproliferative disease has a similar appearance. Electronically Signed   By: Elon Alas M.D.   On: 11/22/2015 02:16     Assessment/Plan:  Total days of antibiotics: 2  Impression/Recommendation Severe Otitis Externa Otitis Media  AIDS ESRD on HD HTN +MRSA PCR Hyperkalemia  No evidence of malignant otitis externa on CT Continue Ceftazidime while in hospital Can dispo home on 7 days of ceftaz with HD Await ear culture (although superficial typically not helpful) Continue Cipro otic drops. Consider ENT consult Await CD4 count and HIV RNA load Continue Prezocobix, Abacavir, and Tivicay- premedicate with zofran as needed  Consider Ambre eval for assistance with adherence.  Encourage starting empiric dapsone/azithro on discharge for MAC/PCP prophylaxis  He has taken dapsone prior, would be hesitant to use pentamidine as misses apices.   Available as needed.   Infectious Diseases (pager) [phone] [email or web address] 11/23/2015, 10:43 AM  LOS: 1 day

## 2015-11-24 ENCOUNTER — Ambulatory Visit: Payer: Self-pay | Admitting: *Deleted

## 2015-11-24 DIAGNOSIS — B20 Human immunodeficiency virus [HIV] disease: Secondary | ICD-10-CM

## 2015-11-24 LAB — BASIC METABOLIC PANEL WITH GFR
Anion gap: 10 (ref 5–15)
BUN: 30 mg/dL — ABNORMAL HIGH (ref 6–20)
CO2: 29 mmol/L (ref 22–32)
Calcium: 9 mg/dL (ref 8.9–10.3)
Chloride: 99 mmol/L — ABNORMAL LOW (ref 101–111)
Creatinine, Ser: 9.62 mg/dL — ABNORMAL HIGH (ref 0.61–1.24)
GFR calc Af Amer: 7 mL/min — ABNORMAL LOW
GFR calc non Af Amer: 6 mL/min — ABNORMAL LOW
Glucose, Bld: 111 mg/dL — ABNORMAL HIGH (ref 65–99)
Potassium: 4.6 mmol/L (ref 3.5–5.1)
Sodium: 138 mmol/L (ref 135–145)

## 2015-11-24 MED ORDER — AZITHROMYCIN 600 MG PO TABS: 1200.0000 mg | ORAL_TABLET | ORAL | 0 refills | Status: DC

## 2015-11-24 MED ORDER — DAPSONE 100 MG PO TABS: 100.0000 mg | ORAL_TABLET | Freq: Every day | ORAL | 11 refills | Status: DC

## 2015-11-24 MED ORDER — RENA-VITE PO TABS: 1.0000 | ORAL_TABLET | Freq: Every day | ORAL | Status: DC

## 2015-11-24 MED ORDER — CIPROFLOXACIN-DEXAMETHASONE 0.3-0.1 % OT SUSP: 4.0000 [drp] | Freq: Two times a day (BID) | OTIC | 0 refills | Status: DC

## 2015-11-24 MED ORDER — SUCROFERRIC OXYHYDROXIDE 500 MG PO CHEW
1000.0000 mg | CHEWABLE_TABLET | Freq: Three times a day (TID) | ORAL | Status: DC
  Administered 2015-11-24: 1000 mg via ORAL
  Filled 2015-11-24 (×2): qty 2

## 2015-11-24 MED ORDER — CEFTAZIDIME 1 G IJ SOLR: 1.0000 g | INTRAMUSCULAR | 0 refills | Status: DC

## 2015-11-24 NOTE — Discharge Instructions (Signed)
CONTINUE ALL MEDICATIONS AS PRESCRIBED.  YOU WILL CONTINUE TO RECEIVE YOUR ANTIBIOTICS WITH DIALYSIS. USE YOUR DROPS IN THE RIGHT EAR AS WELL.  TAKE DAPSONE 100 MG ONCE A DAY  AND AZITHROMYCIN 1200 MG ONCE A WEEK TO HELP PREVENT INFECTIONS GIVEN YOUR HIV/AIDS STATUS.

## 2015-11-24 NOTE — Progress Notes (Signed)
Subjective:  Feeling somewhat better/ yesterday HD tolerated and  ENT yest. Cleaned out ear /cerumen impaction .   Objective Vital signs in last 24 hours: Vitals:   11/23/15 1733 11/23/15 2119 11/24/15 0045 11/24/15 0612  BP: (!) 158/92 (!) 152/97 (!) 153/96 (!) 190/92  Pulse: (!) 107 97 (!) 105 (!) 102  Resp: 16 16 16 18   Temp: 98.7 F (37.1 C) 99.6 F (37.6 C) 99 F (37.2 C) 98.7 F (37.1 C)  TempSrc: Oral Oral Oral Oral  SpO2: 100% 99% 98% 99%  Weight:      Height:       Weight change:   Physical Exam: General: alert NAD  AAM  Ox3 Heart: RRR no rub Lungs: CTA  Abdomen: soft Bs pos NT. ND Extremities: trace bi  pedal edema  Dialysis Access: pos bruit LFA AVF    OP Dialysis orders: Dialyzes at [facility] 64 kg HD 3 hr 45 min Bath 2K/2Ca, Dialyzer F 180, Heparin 5000 u q RxAccess L FA fistula Calcitriol 0.5 mcg 3x weekly Mircera 100 mcg q 2 weeks (next due 10/2) Venofer 50 mg q week  Problem/Plan: 1 Hyperkalemia: K 5.3/5.2 yesterday pre hd  /am labs pending  . Had HD yesterday so K should be ok  2 ESRD: TTS at [facility].  On schedule now .Noted had refused dialysis for ^ K 6.8  9/25. Hd in am  If still here  3 Hypertension:  Per night RN was not taking BP meds as per scheduled  And did vomit last pm after some meds given/ on Cartia 180 mg daily, Coreg 12.5 mg BID, hydral 100 mg TID, losartan 100 mg daily. Some ^ bp   likely due to ^ volume as he doesn't get down to his EDW= 64kg /.yest post hd wt 68.2 with uf  2906 denies cramping  Did not sign off . In am Attempt  3.5 to 4l uf  4. 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 Not due until October 3rd. Hgb 10.2 9/21; suspect a dilutional component so will eval after dialysis, Hgb 9.0 yesterday pre hd / Fu next hgb / weekly venofer on hd  5. Metabolic Bone Disease: Phos 7.4,> 7.2 yesterday  Corrected Ca 9.0 (8/29), PTH 636 (8/29). On Calcitriol 0.5 q treatment, binders are TUMS (400 mg elemental calcium) TID with meals and  Velphoro1 tid meals ,noted refusing tums  / and taking Velphoro  incr to 2  6. HIV: on Tivicay, abacavir, and Pezcobix. ID following.  Cd4 ct 20 and VL 125,000 7.  R Otitis externa/ Cerumen Impaction = ENT yesterday suctioned ear out = Feels better and hearing better / per primary on dapto/ceftaz/ ciprodex gtts. CT confirming R otitis externa/ media and possible reactive LAD without mastoiditis.  ID following    Ernest Haber, PA-C [facility] Beeper [phone] 11/24/2015,8:14 AM  LOS: 2 days   Labs: Basic Metabolic Panel:  Recent Labs Lab 11/22/15 0033 11/22/15 1151 11/23/15 0629 11/23/15 0739  NA 136 137 135 135  K 6.1* 6.8* 5.3* 5.2*  CL 98* 99* 96* 97*  CO2 29 27 25 26   GLUCOSE 93 85 86 82  BUN 38* 43* 55* 55*  CREATININE 9.78* 11.34* 13.01* 13.04*  CALCIUM 9.1 9.2 9.0 8.9  PHOS 6.4*  --   --  7.2*   Liver Function Tests:  Recent Labs Lab 11/21/15 2010 11/22/15 0033 11/23/15 0739  AST 11*  --   --   ALT 10*  --   --  ALKPHOS 40  --   --   BILITOT 0.6  --   --   PROT 7.2  --   --   ALBUMIN 2.8* 2.7* 2.5*  CBC:  Recent Labs Lab 11/21/15 2010 11/22/15 0033 11/23/15 0734  WBC 9.6 10.1 8.5  NEUTROABS 7.5  --   --   HGB 9.6* 9.7* 9.0*  HCT 30.8* 31.4* 28.2*  MCV 92.2 91.8 90.7  PLT 196 198 161      . abacavir  600 mg Oral Q24H  . albuterol  10 mg Nebulization Once  . albuterol  2.5 mg Nebulization Once  . calcitRIOL  0.5 mcg Oral Q T,Th,Sa-HD  . calcium carbonate  2 tablet Oral TID WC  . carvedilol  12.5 mg Oral BID WC  . cefTAZidime (FORTAZ)  IV  1 g Intravenous Q24H  . Chlorhexidine Gluconate Cloth  6 each Topical Q0600  . ciprofloxacin-dexamethasone  4 drop Both Ears BID  . darunavir-cobicistat  1 tablet Oral Q24H  . dextrose  1 ampule Intravenous Once  . diltiazem  180 mg Oral Daily  . dolutegravir  50 mg Oral Q24H  . heparin  5,000 Units Subcutaneous Q8H  . hydrALAZINE  100 mg Oral 3 times per day  . insulin aspart  10  Units Intravenous Once  . levETIRAcetam  250 mg Oral Once per day on Tue Thu Sat  . levETIRAcetam  500 mg Oral Once per day on Sun Mon Wed Fri  . multivitamin  1 tablet Oral QHS  . mupirocin ointment  1 application Nasal BID  . sodium polystyrene  30 g Oral Once  . sucroferric oxyhydroxide  500 mg Oral TID WC

## 2015-11-24 NOTE — Progress Notes (Signed)
   Subjective: Patient was evaluated this morning on rounds. He was laying comfortably in bed. He states he wants to go home today. He states that hearing in his right ear has improved. He declined full physical exam because he did not want to move to help for lung exam.  Objective:  Vital signs in last 24 hours: Vitals:   11/23/15 2119 11/24/15 0045 11/24/15 0612 11/24/15 1001  BP: (!) 152/97 (!) 153/96 (!) 190/92 (!) 199/91  Pulse: 97 (!) 105 (!) 102 (!) 101  Resp: 16 16 18 18   Temp: 99.6 F (37.6 C) 99 F (37.2 C) 98.7 F (37.1 C) 98.2 F (36.8 C)  TempSrc: Oral Oral Oral Oral  SpO2: 99% 98% 99% 99%  Weight:      Height:       Physical Exam  Constitutional: He is well-developed, well-nourished, and in no distress.  Cardiovascular: Normal rate, regular rhythm and normal heart sounds.   Pulmonary/Chest: Effort normal. No respiratory distress.  Declined exam  Abdominal: Soft. He exhibits no distension. There is no tenderness.  Musculoskeletal: He exhibits no edema or tenderness.  Neurological: He is alert.  Skin: Skin is warm and dry.   Assessment/Plan:  Active Problems:   Otitis externa  Severe Otitis Externa Patient had significant debris cleaned out of his right ear with suction and culture was taken.  He reported improvement in hearing after cleaning. At this point we will continue with Cipro drops. Infectious disease recommended 7 days of Ceftazidime with hemodialysis.   - Ceftazidime 7 days with HD - Ciprodex drops  - Culture of ear drainage pending - Appreciate IDs recommendations - appreciate ENTs recommendations  Hyperkalemia Labs pending this morning. Patient's K was corrected with dialysis yesterday.  Not concerned for hyperkalemia at this time. -Holding losartan  AIDS Patient's CD4 count is 30. ID recommended dapsone/azithromycin on discharge for MAC/PCP prophylaxis. As patient states he is allergic to Bactrim. -Continue Prezcobix, Abacavir and  Tivicay  ESRD on HD Patient with HIVAN on HD since Dec 2015 on TTSschedule.  -Nephrology following, we appreciate recommendations  HTN Blood pressure 190/92. Patient is on Coreg 12.5 mg BID, Cardizem 180 mg Daily, hydralazine 25 mg TID, and losartan 100mg  daily for BP control. -Holding losartan -Continue Coreg 12.5 mg BID, Cardizem 180 mg Daily, hydralazine 25 mg TID  H/o seizure Patient follows with Neurology. -Continue Keppra 500 mg SMWF, 250mg  TTS  DVT/PE ppx: Heparin TID FEN: Renal CODE: FULL  Dispo: Anticipated discharge today(s).   Valinda Party, DO 11/24/2015, 11:22 AM Pager: [phone]

## 2015-11-24 NOTE — Consult Note (Signed)
Concerning the question of the patient's Ceftazidime dosing with regards to his hemodialysis schedule:  [facility]'s Guide to Antimicrobial Therapy recommends giving an additional 1g of Ceftazidime after the hemodialysis is complete. Therefore, on dialysis days, the patient should receive a total of 2g of Ceftazidime.  Alena Bills, PharmD Candidate 2018, [place], PharmD Candidate 2018, [facility] Shiela Mayer, PharmD Candidate 2018, [place]

## 2015-11-24 NOTE — Progress Notes (Signed)
Patient will need Ceftazidime 1 gram IV with HD and Ceftazidime 1 gram IV after HD on the following days: Thursday, September 28th Saturday, September 30th Tuesday, October 3rd Thursday, October 5th Saturday, October 7th Tuesday, October 10th Thursday, October 12th  This will complete his course for treatment of Otitis Externa.  Martyn Malay, DO PGY-3 Internal Medicine Resident Pager # [phone] 11/24/2015 11:54 AM

## 2015-11-24 NOTE — Progress Notes (Signed)
INFECTIOUS DISEASE PROGRESS NOTE  Jim Wyatt is a 35 y.o. male with  Active Problems:   Otitis externa  Subjective:   Feels better.    Abtx:  Anti-infectives    Start     Dose/Rate Route Frequency Ordered Stop   11/22/15 1800  abacavir (ZIAGEN) tablet 600 mg     600 mg Oral Every 24 hours 11/22/15 0932     11/22/15 1800  darunavir-cobicistat (PREZCOBIX) 800-150 MG per tablet 1 tablet     1 tablet Oral Every 24 hours 11/22/15 0932     11/22/15 1800  dolutegravir (TIVICAY) tablet 50 mg     50 mg Oral Every 24 hours 11/22/15 0932     11/22/15 1530  DAPTOmycin (CUBICIN) 273 mg in sodium chloride 0.9 % IVPB  Status:  Discontinued     4 mg/kg  68.3 kg 210.9 mL/hr over 30 Minutes Intravenous  Once 11/22/15 1446 11/22/15 1546   11/22/15 1000  abacavir (ZIAGEN) tablet 600 mg  Status:  Discontinued     600 mg Oral Daily 11/21/15 2242 11/22/15 0932   11/22/15 1000  dolutegravir (TIVICAY) tablet 50 mg  Status:  Discontinued     50 mg Oral Daily 11/21/15 2242 11/22/15 0932   11/22/15 0800  darunavir-cobicistat (PREZCOBIX) 800-150 MG per tablet 1 tablet  Status:  Discontinued     1 tablet Oral Daily with breakfast 11/21/15 2242 11/22/15 0932   11/21/15 2315  DAPTOmycin (CUBICIN) 273 mg in sodium chloride 0.9 % IVPB     4 mg/kg  68.3 kg 210.9 mL/hr over 30 Minutes Intravenous  Once 11/21/15 2307 11/22/15 0136   11/21/15 2145  vancomycin (VANCOCIN) 1,500 mg in sodium chloride 0.9 % 500 mL IVPB  Status:  Discontinued     1,500 mg 250 mL/hr over 120 Minutes Intravenous  Once 11/21/15 2140 11/21/15 2247   11/21/15 2145  cefTAZidime (FORTAZ) 1 g in dextrose 5 % 50 mL IVPB     1 g 100 mL/hr over 30 Minutes Intravenous Every 24 hours 11/21/15 2140        Medications:  Scheduled: . abacavir  600 mg Oral Q24H  . albuterol  10 mg Nebulization Once  . albuterol  2.5 mg Nebulization Once  . calcitRIOL  0.5 mcg Oral Q T,Th,Sa-HD  . carvedilol  12.5 mg Oral BID WC  . cefTAZidime  (FORTAZ)  IV  1 g Intravenous Q24H  . Chlorhexidine Gluconate Cloth  6 each Topical Q0600  . ciprofloxacin-dexamethasone  4 drop Both Ears BID  . darunavir-cobicistat  1 tablet Oral Q24H  . dextrose  1 ampule Intravenous Once  . diltiazem  180 mg Oral Daily  . dolutegravir  50 mg Oral Q24H  . heparin  5,000 Units Subcutaneous Q8H  . hydrALAZINE  100 mg Oral 3 times per day  . insulin aspart  10 Units Intravenous Once  . levETIRAcetam  250 mg Oral Once per day on Tue Thu Sat  . levETIRAcetam  500 mg Oral Once per day on Sun Mon Wed Fri  . multivitamin  1 tablet Oral QHS  . mupirocin ointment  1 application Nasal BID  . sodium polystyrene  30 g Oral Once  . sucroferric oxyhydroxide  1,000 mg Oral TID WC    Objective: Vital signs in last 24 hours: Temp:  [98.3 F (36.8 C)-99.6 F (37.6 C)] 98.7 F (37.1 C) (09/27 0612) Pulse Rate:  [97-113] 102 (09/27 0612) Resp:  [16-18] 18 (09/27 0612) BP: (  152-190)/(90-104) 190/92 (09/27 0612) SpO2:  [97 %-100 %] 99 % (09/27 0612) Weight:  [68.2 kg (150 lb 5.7 oz)] 68.2 kg (150 lb 5.7 oz) (09/26 1110)   General appearance: alert and no distress Ears: Improved right periauricular tenderness, decreased discharge Resp: clear to auscultation bilaterally Cardio: regular rate and rhythm Extremities: extremities normal, atraumatic, no cyanosis or edema and Left forearm AVF- +b/t Neurologic: Mental status: Alert, oriented, thought content appropriate  Lab Results  Recent Labs  11/22/15 0033  11/23/15 0629 11/23/15 0734 11/23/15 0739  WBC 10.1  --   --  8.5  --   HGB 9.7*  --   --  9.0*  --   HCT 31.4*  --   --  28.2*  --   NA 136  < > 135  --  135  K 6.1*  < > 5.3*  --  5.2*  CL 98*  < > 96*  --  97*  CO2 29  < > 25  --  26  BUN 38*  < > 55*  --  55*  CREATININE 9.78*  < > 13.01*  --  13.04*  < > = values in this interval not displayed. Liver Panel  Recent Labs  11/21/15 2010 11/22/15 0033 11/23/15 0739  PROT 7.2  --   --     ALBUMIN 2.8* 2.7* 2.5*  AST 11*  --   --   ALT 10*  --   --   ALKPHOS 40  --   --   BILITOT 0.6  --   --    Sedimentation Rate  Recent Labs  11/22/15 0033  ESRSEDRATE 69*   C-Reactive Protein  Recent Labs  11/22/15 0033  CRP 3.3*    Microbiology: Recent Results (from the past 240 hour(s))  MRSA PCR Screening     Status: Abnormal   Collection Time: 11/21/15 11:00 PM  Result Value Ref Range Status   MRSA by PCR POSITIVE (A) NEGATIVE Final    Comment:        The GeneXpert MRSA Assay (FDA approved for NASAL specimens only), is one component of a comprehensive MRSA colonization surveillance program. It is not intended to diagnose MRSA infection nor to guide or monitor treatment for MRSA infections. RESULT CALLED TO, READ BACK BY AND VERIFIED WITH: DAVIS,C RN [phone] AT 0202 SKEEN,P   Ear culture     Status: None (Preliminary result)   Collection Time: 11/21/15 11:16 PM  Result Value Ref Range Status   Specimen Description EAR  Final   Special Requests Immunocompromised  Final   Culture CULTURE REINCUBATED FOR BETTER GROWTH  Final   Report Status PENDING  Incomplete  Aerobic Culture (superficial specimen)     Status: None (Preliminary result)   Collection Time: 11/23/15  4:38 PM  Result Value Ref Range Status   Specimen Description EAR RIGHT  Final   Special Requests NONE  Final   Gram Stain   Final    NO WBC SEEN MODERATE GRAM POSITIVE COCCI IN PAIRS IN CLUSTERS FEW GRAM NEGATIVE COCCOBACILLI RARE GRAM POSITIVE RODS RARE GRAM NEGATIVE DIPLOCOCCI    Culture TOO YOUNG TO READ  Final   Report Status PENDING  Incomplete    Studies/Results: No results found.   Assessment/Plan:  Total days of antibiotics: 4  Impression/Recommendation Severe Otitis Externa/ Cerumen impaction  AIDS ESRD on HD HTN +MRSA PCR Hyperkalemia  Yesterday ENT cleaned out ear/cerumen impaction- no evidence of OM Await culture from canal sent- need to  r/o MRSA on cx Can  dispo home on 7 days of ceftaz and vanco with HD Continue Cipro otic drops Await superficial ear culture (although superficial typically not helpful) Await HIV RNA load CD4 30   Continue Prezocobix, Abacavir, and Tivicay- premedicate with zofran as needed  Consider Ambre eval for assistance with adherence.  Encourage starting empiric dapsone/azithro on discharge for MAC/PCP prophylaxis  He has taken dapsone prior, would be hesitant to use pentamidine as misses apices.   My great appreciation to IMTS  Available as needed.   Infectious Diseases (pager) [phone] [email or web address] 11/24/2015, 9:42 AM  LOS: 2 days

## 2015-11-24 NOTE — Progress Notes (Signed)
Discharge Note:  Patient alert and oriented X 4 and in no distress.  Patient given discharge instructions regarding signs and symptoms to report, medications, diet, activity, and upcoming appointments.  He verbalized understanding of all instructions.  Peripheral IV discontinued.  Patient confirmed that he had all of his personal belongings.  He will be transported out via wheelchair by hospital volunteer.

## 2015-11-25 ENCOUNTER — Telehealth: Payer: Self-pay | Admitting: *Deleted

## 2015-11-25 LAB — EAR CULTURE

## 2015-11-25 NOTE — Telephone Encounter (Signed)
RN attempted to meet the patient during his last hospitalization but the patient had already left for the day.   RN called the patient today but received an automated message stating the patient cannot accept calls at this time  RN then called the patient's emergency contact (his mother) Ms. Joelene Millin without giving a lot of details RN explained that I am a nurse trying to get in contact with her son to offer Case  Management services. Ms. Joelene Millin shared with me that she is fully aware of his medical hx. Even after this statement RN never actually stated the patient's medical diagnosis but did state I am concerned about his willingness to adhere to his medication.   Ms. Maylon Cos stated Mr. Taillon phone is off but she would really like for me to get in contact with him and offer help. Ms. Maylon Cos stated she came up to [facility] and organized his medications in the past so she knows he is struggling to adhere to his medication. She states he is most likely depressed as well but she is working hard to help him through it.   RN waiting on a call back from Ms. Joelene Millin letting me know if Mr. Cafaro will allow me to make a home visit

## 2015-11-26 ENCOUNTER — Ambulatory Visit: Payer: Self-pay | Admitting: *Deleted

## 2015-11-26 VITALS — BP 172/110 | HR 88 | Temp 97.6°F | Ht 66.0 in

## 2015-11-26 DIAGNOSIS — B20 Human immunodeficiency virus [HIV] disease: Secondary | ICD-10-CM

## 2015-11-27 LAB — AEROBIC CULTURE W GRAM STAIN (SUPERFICIAL SPECIMEN): Gram Stain: NONE SEEN

## 2015-11-27 LAB — AEROBIC CULTURE  (SUPERFICIAL SPECIMEN)

## 2015-11-28 NOTE — Discharge Summary (Signed)
Medicine attending discharge note: I attest to the accuracy of the discharge evaluation and plan as recorded by resident physician Dr. Kalman Shan in her progress note dated on the day of discharge 11/24/2015.  Clinical summary: 35 year old HIV-positive man  poorly compliant with his anti-retroviral therapy. 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 He presented with a one-week history of increasing pain in his right ear. He denied any fever. On his initial examination temperature 99.3. Preauricular lymphadenopathy noted. Ear canal partially occluded with wax and a white exudate. Tympanic membrane could not be visualized. CBC with white count 9600, 78% neutrophils, 8 lymphocytes. CT scans of the sinuses showed chronic maxillary sinusitis. Soft tissue swelling in the right external auditory canal. No destructive bony changes. No mastoiditis.  Hospital course: In view of his immunocompromised status, he was treated with broad-spectrum parenteral antibiotics in addition to topical antibiotics. He was seen in consultation by infectious disease. His antiretroviral therapy was resumed. His pain slowly subsided. He was seen in consultation by ear nose and throat surgery. Debris in the right canal was cleared with suction. Gram stain of the exudate showed multiple bacterial species including gram-positive cocci in pairs and clusters, rare gram-negative cocci bacilli, rare gram-positive rods, rare gram-negative diplococci. Cultures grew a rare colony of MRSA and rare Staphylococcus lugdunensis. He was otherwise stable for discharge at that time to continue vancomycin and Ceftazidime  to be administered at time of his dialysis sessions and continue Cipro topical eardrops.  Disposition: Condition stable at time of discharge Follow-up with infectious disease and nephrology. There were no complications

## 2015-11-29 ENCOUNTER — Telehealth: Payer: Self-pay | Admitting: *Deleted

## 2015-11-29 NOTE — Telephone Encounter (Signed)
During my home visit with the patient on 09/29 he was planning on picking his medication up from the pharmacy on 10/01 (patient's new insurance became active on this date).   RN received a message from the patient's mother wanted a update on the patient's medications.  RN contacted [employer] and spoke with the store manager who stated the patient's medications are still over 600.00 dollars and it does not appear that the patient has provided his up to date insurance information  RN sent a message to the patient's mother asking her if [name] has called or taken a copy of his new insurance information. I cannot contact [name] because currently his phone is not in service.

## 2015-11-30 ENCOUNTER — Encounter: Payer: Self-pay | Admitting: Internal Medicine

## 2015-11-30 NOTE — Progress Notes (Signed)
Ear Swab culture came back positive for MRSA and Staph Lugdnensis. These results were discussed with ID and they recommend treating with 2 weeks of Vancomycin with HD. He was originally discharged with Ceftazidime. Patient will receive Vancomycin 750 mg IV after HD sessions on October 5, Oct 7, Oct 10, Oct 12, Oct 14 and Oct 17 then stop. This was discussed with Nephrology.  Martyn Malay, DO PGY-3 Internal Medicine Resident Pager # [phone] 11/30/2015 4:44 PM

## 2015-12-01 NOTE — Discharge Summary (Signed)
Name: Jim Wyatt MRN: [id number] DOB: 1980/03/04 35 y.o. PCP: [name], MD  Date of Admission: 11/21/2015  6:11 PM Date of Discharge: 11/24/2015 Attending Physician: Murriel Hopper MD  Discharge Diagnosis: 1. Otitis externa of right ear   Discharge Medications:   Medication List    TAKE these medications   abacavir 300 MG tablet Commonly known as:  ZIAGEN Take 2 tablets (600 mg total) by mouth daily. What changed:  how much to take  when to take this   acetaminophen 500 MG tablet Commonly known as:  TYLENOL Take 1,000 mg by mouth every 6 (six) hours as needed (pain).   albuterol 108 (90 Base) MCG/ACT inhaler Commonly known as:  PROVENTIL HFA;VENTOLIN HFA Inhale 2 puffs into the lungs every 6 (six) hours as needed for wheezing or shortness of breath.   azithromycin 600 MG tablet Commonly known as:  ZITHROMAX Take 2 tablets (1,200 mg total) by mouth once a week.   calcium carbonate 500 MG chewable tablet Commonly known as:  TUMS - dosed in mg elemental calcium Chew 1 tablet (200 mg of elemental calcium total) by mouth 3 (three) times daily with meals.   carvedilol 12.5 MG tablet Commonly known as:  COREG Take 12.5 mg by mouth 2 (two) times daily. Reported on 04/22/2015   cefTAZidime 1 g in dextrose 5 % 50 mL Inject 1 g into the vein every other day. 2 grams IV with dialysis until December 09, 2015.   ciprofloxacin-dexamethasone otic suspension Commonly known as:  CIPRODEX Place 4 drops into the right ear 2 (two) times daily.   dapsone 100 MG tablet Take 1 tablet (100 mg total) by mouth daily.   darunavir-cobicistat 800-150 MG tablet Commonly known as:  PREZCOBIX Take 1 tablet by mouth daily. Swallow whole. Do NOT crush, break or chew tablets. Take with food. What changed:  when to take this  additional instructions   diltiazem 180 MG 24 hr capsule Commonly known as:  CARDIZEM CD Take 1 capsule (180 mg total) by mouth daily.     diphenhydrAMINE 25 MG tablet Commonly known as:  BENADRYL Take 25 mg by mouth every 4 (four) hours.   dolutegravir 50 MG tablet Commonly known as:  TIVICAY Take 1 tablet (50 mg total) by mouth daily. What changed:  when to take this   hydrALAZINE 100 MG tablet Commonly known as:  APRESOLINE Take 100 mg by mouth 3 (three) times daily. 6:30am, 2:30pm, 11pm - for high blood pressure   hydrALAZINE 25 MG tablet Commonly known as:  APRESOLINE Take 1 tablet (25 mg total) by mouth 3 (three) times daily.   HYDROcodone-acetaminophen 5-325 MG tablet Commonly known as:  NORCO Take 1-2 tablets by mouth every 6 (six) hours as needed for moderate pain or severe pain.   levETIRAcetam 500 MG tablet Commonly known as:  KEPPRA Take 500 mg by mouth See admin instructions. Take 1 tablet (500 mg) by mouth Sunday, Monday, Wednesday, Friday at 7pm (non-dialysis days) What changed:  Another medication with the same name was changed. Make sure you understand how and when to take each.   levETIRAcetam 250 MG tablet Commonly known as:  KEPPRA Take 1 tablet (250 mg total) by mouth See admin instructions. Take 1 tablet after your dialysis treatments. What changed:  additional instructions   losartan 100 MG tablet Commonly known as:  COZAAR Take 100 mg by mouth at bedtime.   DIALYVITE 800 0.8 MG Wafr Take 800 mg by mouth daily.  multivitamin Tabs tablet Take 1 tablet by mouth at bedtime.   ondansetron 4 MG tablet Commonly known as:  ZOFRAN Take 4 mg by mouth every 12 (twelve) hours as needed for nausea or vomiting.   ondansetron 8 MG disintegrating tablet Commonly known as:  ZOFRAN ODT Take 1 tablet (8 mg total) by mouth every 8 (eight) hours as needed for nausea or vomiting.   oxymetazoline 0.05 % nasal spray Commonly known as:  AFRIN Place 2-3 sprays into both nostrils 2 (two) times daily.   promethazine 12.5 MG tablet Commonly known as:  PHENERGAN Take 1 tablet (12.5 mg total) by mouth  every 6 (six) hours as needed for nausea or vomiting.   VELPHORO 500 MG chewable tablet Generic drug:  sucroferric oxyhydroxide Chew 500 mg by mouth See admin instructions. Chew 1 tablet (500 mg) by mouth after every meal (2-3 times daily)   VITAMIN B-12 PO Take 6,000 mcg by mouth 3 (three) times daily. 2 gummies equal 6000 mcg       Disposition and follow-up:   [name] was discharged from [facility] in stable condition.  At the hospital follow up visit please address:  1.  Anti-retroviral therapy compliance, MAC and PCP prophylaxis compliance  2.  Labs / imaging needed at time of follow-up: none  3.  Pending labs/ test needing follow-up: none  Follow-up Appointments: Follow-up Information    [name], MD .   Specialty:  Internal Medicine Contact information: [place] [address] [phone]        Carlyle Basques, MD. Schedule an appointment as soon as possible for a visit today.   Specialty:  Infectious Diseases Contact information: [place] [address] [place] [address] [place] [facility] Course by problem list: Active Problems:  Otitis externa of right ear Patient with poorly controlled AIDS noncompliant with his anti-retroviral's  was admitted with severe otitis externa. He was treated with Ceftazidime and Ciprodex drops.  ENT was consulted and patient's ear was cleaned with improvement in hearing afterwards. The patient's pain had also decreased during admission.   Ear swab culture came back positive for MRSA and staph Lugdnensis.  ID recommended treating with vancomycin and ceftazidime with hemodialysis. He was also discharged with Ciprodex drops.    Hyperkalemia Patient had a potassium of 6.8 on 9/25. He declined hemodialysis or other medication options. He was explained the risk of death but declined intervention. He resumed his dialysis the following day.  Prior to  discharge his hyperkalemia resolved and his potassium was 4.6.  ESRD on Hemodialysis Patient received inpatient hemodialysis on his scheduled days (Tuesday, Thursday and Saturday).   AIDS His antiretrovirals were continued during admission.  Patient's CD4 count was 30. Dapsone/azithromycin was recommended on discharge for MAC/PCP prophylaxis since patient states he is allergic to Bactrim.  Hypertension Continued patient's blood pressure medications with the exception of losartan.    History of seizures Stable during admission.  Continued keppra 500mg  Sunday, Monday, Wednesday, Friday, 250mg  Tuesday, Thursday, Saturday   Discharge Vitals:   BP (!) 199/91 (BP Location: Left Leg)   Pulse (!) 101   Temp 98.2 F (36.8 C) (Oral)   Resp 18   Ht 5\' 7"  (1.702 m)   Wt 150 lb 5.7 oz (68.2 kg)   SpO2 99%   BMI 23.55 kg/m   Pertinent Labs, Studies, and Procedures:  CT Maxillofacial wo contrast  Discharge  Instructions: Discharge Instructions    Diet - low sodium heart healthy    Complete by:  As directed    Increase activity slowly    Complete by:  As directed       Signed: Valinda Party, DO 12/01/2015, 12:13 AM   Pager: [phone]

## 2015-12-02 LAB — REFLEX TO GENOSURE(R) MG: HIV GenoSure(R) MG PDF: 0

## 2015-12-02 LAB — HIV-1 RNA ULTRAQUANT REFLEX TO GENTYP+
HIV-1 RNA BY PCR: 550 copies/mL
HIV-1 RNA Quant, Log: 2.74 log10copy/mL

## 2015-12-06 ENCOUNTER — Encounter: Payer: Self-pay | Admitting: Internal Medicine

## 2015-12-06 ENCOUNTER — Ambulatory Visit (INDEPENDENT_AMBULATORY_CARE_PROVIDER_SITE_OTHER): Payer: BLUE CROSS/BLUE SHIELD | Admitting: Internal Medicine

## 2015-12-06 VITALS — BP 200/122 | HR 108 | Temp 98.9°F | Wt 149.0 lb

## 2015-12-06 DIAGNOSIS — B20 Human immunodeficiency virus [HIV] disease: Secondary | ICD-10-CM | POA: Diagnosis not present

## 2015-12-06 DIAGNOSIS — R21 Rash and other nonspecific skin eruption: Secondary | ICD-10-CM | POA: Diagnosis not present

## 2015-12-06 DIAGNOSIS — I10 Essential (primary) hypertension: Secondary | ICD-10-CM | POA: Diagnosis not present

## 2015-12-06 LAB — CBC WITH DIFFERENTIAL/PLATELET
Basophils Absolute: 51 cells/uL (ref 0–200)
Basophils Relative: 1 %
Eosinophils Absolute: 102 cells/uL (ref 15–500)
Eosinophils Relative: 2 %
HCT: 33.8 % — ABNORMAL LOW (ref 38.5–50.0)
Hemoglobin: 10.7 g/dL — ABNORMAL LOW (ref 13.2–17.1)
Lymphocytes Relative: 10 %
Lymphs Abs: 510 cells/uL — ABNORMAL LOW (ref 850–3900)
MCH: 28.6 pg (ref 27.0–33.0)
MCHC: 31.7 g/dL — ABNORMAL LOW (ref 32.0–36.0)
MCV: 90.4 fL (ref 80.0–100.0)
MPV: 9.3 fL (ref 7.5–12.5)
Monocytes Absolute: 612 cells/uL (ref 200–950)
Monocytes Relative: 12 %
Neutro Abs: 3825 cells/uL (ref 1500–7800)
Neutrophils Relative %: 75 %
Platelets: 152 10*3/uL (ref 140–400)
RBC: 3.74 MIL/uL — ABNORMAL LOW (ref 4.20–5.80)
RDW: 15.9 % — ABNORMAL HIGH (ref 11.0–15.0)
WBC: 5.1 10*3/uL (ref 3.8–10.8)

## 2015-12-06 MED ORDER — ABACAVIR SULFATE 300 MG PO TABS: 600.0000 mg | ORAL_TABLET | Freq: Every day | ORAL | 11 refills | Status: DC

## 2015-12-06 MED ORDER — DARUNAVIR-COBICISTAT 800-150 MG PO TABS: 1.0000 | ORAL_TABLET | Freq: Every evening | ORAL | 11 refills | Status: DC

## 2015-12-06 MED ORDER — DOLUTEGRAVIR SODIUM 50 MG PO TABS: 50.0000 mg | ORAL_TABLET | Freq: Every evening | ORAL | 11 refills | Status: DC

## 2015-12-06 NOTE — Progress Notes (Signed)
RFV: follow up for HIV disease, and hospital follow up  Patient ID: Jim Wyatt, male   DOB: 12-29-1980, 35 y.o.   MRN: [id number]  HPI Jim Wyatt is a 35yo M with poorly controlled HIV disease, malignant HTN, ESRD on HD, with CD 4 count of 20/VL 2,740 Taking meds every day until last week when he was unable to afford his tivicay co-pay. He was recently discharged from the hospital for otitis externa which is improving, treated with courses of ceftaz/vanco, which finishes on oct 12th.   ROS: itchiness to dorsum of hands bilaterally, on his back  Outpatient Encounter Prescriptions as of 12/06/2015  Medication Sig  . abacavir (ZIAGEN) 300 MG tablet Take 2 tablets (600 mg total) by mouth daily. (Patient taking differently: Take 300 mg by mouth every evening. )  . acetaminophen (TYLENOL) 500 MG tablet Take 1,000 mg by mouth every 6 (six) hours as needed (pain).  [name] albuterol (PROVENTIL HFA;VENTOLIN HFA) 108 (90 Base) MCG/ACT inhaler Inhale 2 puffs into the lungs every 6 (six) hours as needed for wheezing or shortness of breath.  [name] azithromycin (ZITHROMAX) 600 MG tablet Take 2 tablets (1,200 mg total) by mouth once a week.  . B Complex-C-Folic Acid (DIALYVITE 941) 0.8 MG WAFR Take 800 mg by mouth daily.  . calcium carbonate (TUMS - DOSED IN MG ELEMENTAL CALCIUM) 500 MG chewable tablet Chew 1 tablet (200 mg of elemental calcium total) by mouth 3 (three) times daily with meals.  . carvedilol (COREG) 12.5 MG tablet Take 12.5 mg by mouth 2 (two) times daily. Reported on 04/22/2015  . cefTAZidime 1 g in dextrose 5 % 50 mL Inject 1 g into the vein every other day. 2 grams IV with dialysis until December 09, 2015.  . ciprofloxacin-dexamethasone (CIPRODEX) otic suspension Place 4 drops into the right ear 2 (two) times daily.  . Cyanocobalamin (VITAMIN B-12 PO) Take 6,000 mcg by mouth 3 (three) times daily. 2 gummies equal 6000 mcg  . dapsone 100 MG tablet Take 1 tablet (100 mg total) by mouth daily.  .  darunavir-cobicistat (PREZCOBIX) 800-150 MG tablet Take 1 tablet by mouth daily. Swallow whole. Do NOT crush, break or chew tablets. Take with food. (Patient taking differently: Take 1 tablet by mouth every evening. Swallow whole. Do NOT crush, break or chew tablets. Take with food.)  . diltiazem (CARDIZEM CD) 180 MG 24 hr capsule Take 1 capsule (180 mg total) by mouth daily.  . diphenhydrAMINE (BENADRYL) 25 MG tablet Take 25 mg by mouth every 4 (four) hours.  . dolutegravir (TIVICAY) 50 MG tablet Take 1 tablet (50 mg total) by mouth daily. (Patient taking differently: Take 50 mg by mouth every evening. )  . GNP CRANBERRY EXTRACT PO Take by mouth once.  . hydrALAZINE (APRESOLINE) 100 MG tablet Take 100 mg by mouth 3 (three) times daily. 6:30am, 2:30pm, 11pm - for high blood pressure  . hydrALAZINE (APRESOLINE) 25 MG tablet Take 1 tablet (25 mg total) by mouth 3 (three) times daily.  [name] HYDROcodone-acetaminophen (NORCO) 5-325 MG tablet Take 1-2 tablets by mouth every 6 (six) hours as needed for moderate pain or severe pain.  [name] levETIRAcetam (KEPPRA) 250 MG tablet Take 1 tablet (250 mg total) by mouth See admin instructions. Take 1 tablet after your dialysis treatments. (Patient taking differently: Take 250 mg by mouth See admin instructions. Take 1 tablet (250 mg) by mouth on Tuesday, Thursday, Saturday at 7pm (dialysis days))  . levETIRAcetam (KEPPRA) 500 MG tablet Take 500  mg by mouth See admin instructions. Take 1 tablet (500 mg) by mouth Sunday, Monday, Wednesday, Friday at 7pm (non-dialysis days)  . losartan (COZAAR) 100 MG tablet Take 100 mg by mouth at bedtime.  . multivitamin (RENA-VIT) TABS tablet Take 1 tablet by mouth at bedtime.  [name] nystatin (MYCOSTATIN) 100000 UNIT/ML suspension Take 5 mLs by mouth 4 (four) times daily.  . ondansetron (ZOFRAN ODT) 8 MG disintegrating tablet Take 1 tablet (8 mg total) by mouth every 8 (eight) hours as needed for nausea or vomiting.  . ondansetron (ZOFRAN) 4 MG  tablet Take 4 mg by mouth every 12 (twelve) hours as needed for nausea or vomiting.   [name] oxymetazoline (AFRIN) 0.05 % nasal spray Place 2-3 sprays into both nostrils 2 (two) times daily.  . promethazine (PHENERGAN) 12.5 MG tablet Take 1 tablet (12.5 mg total) by mouth every 6 (six) hours as needed for nausea or vomiting.  . sucroferric oxyhydroxide (VELPHORO) 500 MG chewable tablet Chew 500 mg by mouth See admin instructions. Chew 1 tablet (500 mg) by mouth after every meal (2-3 times daily)   Facility-Administered Encounter Medications as of 12/06/2015  Medication  . amitriptyline (ELAVIL) tablet 15 mg     Patient Active Problem List   Diagnosis Date Noted  . Otitis externa of both ears 11/21/2015  . AIDS (acquired immunodeficiency syndrome), CD4 <=200 ([place])   . Meningitis   . Cephalalgia   . Blurry vision, bilateral   . Headache   . Hypertensive urgency   . New onset of headache in immunocompromised patient ([place]) 07/06/2015  . ESRD on dialysis ([place]) 04/27/2015  . CAP (community acquired pneumonia) 02/25/2015  . ESRD (end stage renal disease) ([place]) 02/16/2015  . Swelling of right knee joint 01/12/2015  . History of DVT (deep vein thrombosis) 01/12/2015  . Anemia 01/12/2015  . Acute blood loss anemia   . Candidal esophagitis ([place])   . Constipation   . Nausea without vomiting 12/20/2014  . Abscess of right thigh 12/19/2014  . Chronic kidney disease (CKD), stage IV (severe) ([place]) 12/19/2014  . 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 Anemia due to end stage renal disease ([place]) 12/19/2014  . Hyponatremia 12/19/2014  . Community acquired pneumonia 07/30/2014  . Sepsis ([place]) 07/30/2014  . Metabolic acidosis 09/60/4540  . Anemia of chronic disease 07/30/2014  . Thrombocytopenia ([place]) 07/30/2014  . Acute on chronic renal failure ([place])   . 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 Human immunodeficiency virus (HIV) disease ([place]) 12/11/2013  . Hospital discharge follow-up 12/11/2013   . CMV (cytomegalovirus infection) ([place]) 12/07/2013  . Protein-calorie malnutrition, severe ([place]) 12/06/2013  . 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 DVT (deep venous thrombosis) ([place]) 11/24/2013  . Asthma, chronic 11/24/2013  . Acquired immune deficiency syndrome ([place]) 11/14/2013  . AKI (acute kidney injury) ([place]) 11/11/2013  . Elevated liver enzymes 11/09/2013  . History of shingles 11/09/2013  . Obesity 11/09/2013  . Pneumonia   . Asthma      Health Maintenance Due  Topic Date Due  . TETANUS/TDAP  01/21/2002  . INFLUENZA VACCINE  09/28/2015     Review of Systems 10 point ros is reviewed, positive pertinents listed in hpi Physical Exam   BP (!) 200/122   Pulse (!) 108   Temp 98.9 F (37.2 C) (Oral)   Wt 67.6 kg (149 lb)   BMI 24.05 kg/m  Physical Exam  Constitutional: He is oriented to person, place, and time. He appears well-developed and  well-nourished. No distress.  HENT:  Mouth/Throat: Oropharynx is clear and moist. No oropharyngeal exudate.  Cardiovascular: Normal rate, regular rhythm and normal heart sounds. Exam reveals no gallop and no friction rub.  No murmur heard.  Pulmonary/Chest: Effort normal and breath sounds normal. No respiratory distress. He has no wheezes.  Abdominal: Soft. Bowel sounds are normal. He exhibits no distension. There is no tenderness.  Lymphadenopathy:  He has no cervical adenopathy.  Neurological: He is alert and oriented to person, place, and time.  Skin: Skin is warm and dry. No rash noted. No erythema.  Psychiatric: He has a normal mood and affect. His behavior is normal.    Lab Results  Component Value Date   CD4TCELL 2 (L) 11/05/2015   Lab Results  Component Value Date   CD4TABS 20 (L) 11/05/2015   CD4TABS 10 (L) 07/06/2015   CD4TABS 20 (L) 04/22/2015   Lab Results  Component Value Date   HIV1RNAQUANT 125,000 11/05/2015   No results found for: HEPBSAB No results  found for: RPR  CBC Lab Results  Component Value Date   WBC 8.5 11/23/2015   RBC 3.11 (L) 11/23/2015   HGB 9.0 (L) 11/23/2015   HCT 28.2 (L) 11/23/2015   PLT 161 11/23/2015   MCV 90.7 11/23/2015   MCH 28.9 11/23/2015   MCHC 31.9 11/23/2015   RDW 15.4 11/23/2015   LYMPHSABS 0.7 11/21/2015   MONOABS 0.8 11/21/2015   EOSABS 0.6 11/21/2015   BASOSABS 0.0 11/21/2015   BMET Lab Results  Component Value Date   NA 138 11/24/2015   K 4.6 11/24/2015   CL 99 (L) 11/24/2015   CO2 29 11/24/2015   GLUCOSE 111 (H) 11/24/2015   BUN 30 (H) 11/24/2015   CREATININE 9.62 (H) 11/24/2015   CALCIUM 9.0 11/24/2015   GFRNONAA 6 (L) 11/24/2015   GFRAA 7 (L) 11/24/2015     Assessment and Plan   Rash = will check for syphilis vs. Drug rash. He is off of his HIV meds, thus, unlikely to be those medicaitons. Concern that it may be related to seizure meds. For now continue with topical treatment. Will check for eos, currently only localized to hands  Htn, poorly controlled = repeat BP 166/114.   hiv disease= continue to have him take medication and applaud his adherence, will clear up his insurance pharmacy issue he reports

## 2015-12-06 NOTE — Progress Notes (Signed)
Patient ID: Jim Wyatt, male   DOB: Mar 24, 1980, 35 y.o.   MRN: [id number] HPI: Jim Wyatt is a 35 y.o. male who is here for his HIV visit  Allergies: Allergies  Allergen Reactions  . Bactrim [Sulfamethoxazole-Trimethoprim] Other (See Comments)    Renal failure  . Cauliflower [Brassica Oleracea Italica] Anaphylaxis and Other (See Comments)    Pt states his throat closes up  . Coconut Flavor Hives and Shortness Of Breath  . Fish Allergy Hives and Shortness Of Breath    Throat closes up  . Onion Hives and Shortness Of Breath  . Vancomycin Other (See Comments)    Patient states vancomycin caused kidney injury  . Peanut-Containing Drug Products Hives  . Reglan [Metoclopramide] Other (See Comments)    Causes ticks/ jerks  . Amlodipine Palpitations and Other (See Comments)    fatigue  . Augmentin [Amoxicillin-Pot Clavulanate] Hives  . Banana Other (See Comments)    Per allergy test    Vitals: Temp: 98.9 F (37.2 C) (10/09 0957) Temp Source: Oral (10/09 0957) BP: 200/122 (10/09 0957) Pulse Rate: 108 (10/09 0957)  Past Medical History: Past Medical History:  Diagnosis Date  . Asthma   . CAP (community acquired pneumonia) 10/2013  . Cellulitis 11/2013   LLE  . Chronic kidney disease   . DVT (deep venous thrombosis) ([place]) 10/2013   BLE  . Fever   . Headache   . HIV infection ([place]) dx'd 10/2013  . Hypertension   . Leg pain   . Multiple environmental allergies    "trees, dogs, cats"  . Multiple food allergies   . Shingles < 2010  . 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 Marital status: Single    Spouse name: N/A  . Number of children: N/A  . Years of education: N/A   Social History Main Topics  . Smoking status: Never Smoker  . Smokeless tobacco: Never Used  . Alcohol use Yes     Comment: I stopped drinking along time ago "  . Drug use: No  . Sexual activity: Not Asked   Other Topics Concern  .  None   Social History Narrative  . None    Previous Regimen:   Current Regimen: ABC/DTG/Prez  Labs: HIV 1 RNA Quant (copies/mL)  Date Value  11/05/2015 125,000  07/06/2015 86,000  04/22/2015 83,181 (H)   CD4 T Cell Abs (/uL)  Date Value  11/05/2015 20 (L)  07/06/2015 10 (L)  04/22/2015 20 (L)   Hepatitis B Surface Ag (no units)  Date Value  11/23/2015 Negative   HCV Ab (no units)  Date Value  11/10/2013 NEGATIVE    CrCl: Estimated Creatinine Clearance: 9.9 mL/min (by C-G formula based on SCr of 9.62 mg/dL (H)).  Lipids:    Component Value Date/Time   CHOL 100 11/15/2013 0820   TRIG 327 (H) 11/15/2013 0820   HDL 8 (L) 11/15/2013 0820   CHOLHDL 12.5 11/15/2013 0820   VLDL 65 (H) 11/15/2013 0820   LDLCALC 27 11/15/2013 0820    Assessment: Jim Wyatt is an ESRD pt who is getting his HD on TTS. He stopped taking his ART last due to copay issue with his insurance? Not sure why. I'm going to send his HIV meds to [facility] pharmacy so we can know exactly what is the issue. I offered to [facility] all of [facility] rx to [facility] but he prefers to keep getting his other meds at [facility].  Otherwise, his numbers are getting better. He stated that he was very compliant with his meds since his sz around early 41? Phone number but prefer not to be placed in chart. [phone]. Apparently, he has to go through a specialty pharmacy. We'll try to figure out which one that is. Apparently, [facility] was on an old supply since none of the pharmacy has his rx. I'll send the Rxs to [facility] since he has BCBS.   Recommendations:  Cont same regimen Send Rx to [facility], Annia Belt, PharmD, BCPS, AAHIVP, CPP [facility] 12/06/2015, 10:35 AM

## 2015-12-07 LAB — RPR

## 2015-12-14 ENCOUNTER — Other Ambulatory Visit: Payer: Self-pay | Admitting: Pharmacist Clinician (PhC)/ Clinical Pharmacy Specialist

## 2015-12-14 ENCOUNTER — Telehealth: Payer: Self-pay | Admitting: Pharmacist Clinician (PhC)/ Clinical Pharmacy Specialist

## 2015-12-14 DIAGNOSIS — B20 Human immunodeficiency virus [HIV] disease: Secondary | ICD-10-CM

## 2015-12-14 MED ORDER — ABACAVIR SULFATE 300 MG PO TABS: 600.0000 mg | ORAL_TABLET | Freq: Every day | ORAL | 11 refills | Status: DC

## 2015-12-14 MED ORDER — DOLUTEGRAVIR SODIUM 50 MG PO TABS: 50.0000 mg | ORAL_TABLET | Freq: Every evening | ORAL | 11 refills | Status: DC

## 2015-12-14 MED ORDER — DARUNAVIR-COBICISTAT 800-150 MG PO TABS: 1.0000 | ORAL_TABLET | Freq: Every evening | ORAL | 11 refills | Status: DC

## 2015-12-14 NOTE — Telephone Encounter (Signed)
Rx was originally sent to [facility]. They haven't shipped yet and pt has $700 copay. Will tx to [facility] instead so they can help with the copay issue.

## 2015-12-26 ENCOUNTER — Other Ambulatory Visit: Payer: Self-pay | Admitting: Internal Medicine

## 2015-12-28 ENCOUNTER — Encounter (HOSPITAL_COMMUNITY): Payer: Self-pay

## 2015-12-28 ENCOUNTER — Emergency Department (HOSPITAL_COMMUNITY): Payer: BLUE CROSS/BLUE SHIELD

## 2015-12-28 ENCOUNTER — Emergency Department (HOSPITAL_COMMUNITY): Payer: BLUE CROSS/BLUE SHIELD | Admitting: Anesthesiology

## 2015-12-28 ENCOUNTER — Encounter (HOSPITAL_COMMUNITY)
Admission: EM | Disposition: A | Discharge status: Home Health | Payer: Self-pay | Source: Home / Self Care | Attending: Internal Medicine

## 2015-12-28 ENCOUNTER — Inpatient Hospital Stay (HOSPITAL_COMMUNITY)
Admission: EM | Admit: 2015-12-28 | Discharge: 2016-01-04 | DRG: 329 | Disposition: A | Discharge status: Home Health | Payer: BLUE CROSS/BLUE SHIELD | Attending: Internal Medicine | Admitting: Internal Medicine

## 2015-12-28 DIAGNOSIS — B20 Human immunodeficiency virus [HIV] disease: Secondary | ICD-10-CM | POA: Diagnosis present

## 2015-12-28 DIAGNOSIS — N2581 Secondary hyperparathyroidism of renal origin: Secondary | ICD-10-CM | POA: Diagnosis present

## 2015-12-28 DIAGNOSIS — N170 Acute kidney failure with tubular necrosis: Secondary | ICD-10-CM | POA: Diagnosis present

## 2015-12-28 DIAGNOSIS — T50996A Underdosing of other drugs, medicaments and biological substances, initial encounter: Secondary | ICD-10-CM | POA: Diagnosis present

## 2015-12-28 DIAGNOSIS — D631 Anemia in chronic kidney disease: Secondary | ICD-10-CM | POA: Diagnosis present

## 2015-12-28 DIAGNOSIS — Z992 Dependence on renal dialysis: Secondary | ICD-10-CM

## 2015-12-28 DIAGNOSIS — K55049 Acute infarction of large intestine, extent unspecified: Secondary | ICD-10-CM | POA: Diagnosis not present

## 2015-12-28 DIAGNOSIS — D62 Acute posthemorrhagic anemia: Secondary | ICD-10-CM | POA: Diagnosis not present

## 2015-12-28 DIAGNOSIS — E875 Hyperkalemia: Secondary | ICD-10-CM | POA: Diagnosis not present

## 2015-12-28 DIAGNOSIS — R0682 Tachypnea, not elsewhere classified: Secondary | ICD-10-CM

## 2015-12-28 DIAGNOSIS — K439 Ventral hernia without obstruction or gangrene: Secondary | ICD-10-CM | POA: Diagnosis present

## 2015-12-28 DIAGNOSIS — Z789 Other specified health status: Secondary | ICD-10-CM

## 2015-12-28 DIAGNOSIS — Z882 Allergy status to sulfonamides status: Secondary | ICD-10-CM

## 2015-12-28 DIAGNOSIS — R509 Fever, unspecified: Secondary | ICD-10-CM

## 2015-12-28 DIAGNOSIS — I10 Essential (primary) hypertension: Secondary | ICD-10-CM | POA: Diagnosis present

## 2015-12-28 DIAGNOSIS — H6091 Unspecified otitis externa, right ear: Secondary | ICD-10-CM | POA: Diagnosis present

## 2015-12-28 DIAGNOSIS — R188 Other ascites: Secondary | ICD-10-CM | POA: Diagnosis present

## 2015-12-28 DIAGNOSIS — G40909 Epilepsy, unspecified, not intractable, without status epilepticus: Secondary | ICD-10-CM | POA: Diagnosis present

## 2015-12-28 DIAGNOSIS — Z79899 Other long term (current) drug therapy: Secondary | ICD-10-CM

## 2015-12-28 DIAGNOSIS — K37 Unspecified appendicitis: Secondary | ICD-10-CM | POA: Diagnosis present

## 2015-12-28 DIAGNOSIS — I517 Cardiomegaly: Secondary | ICD-10-CM | POA: Diagnosis present

## 2015-12-28 DIAGNOSIS — R571 Hypovolemic shock: Secondary | ICD-10-CM | POA: Diagnosis not present

## 2015-12-28 DIAGNOSIS — I120 Hypertensive chronic kidney disease with stage 5 chronic kidney disease or end stage renal disease: Secondary | ICD-10-CM | POA: Diagnosis not present

## 2015-12-28 DIAGNOSIS — I959 Hypotension, unspecified: Secondary | ICD-10-CM | POA: Diagnosis not present

## 2015-12-28 DIAGNOSIS — D696 Thrombocytopenia, unspecified: Secondary | ICD-10-CM | POA: Diagnosis not present

## 2015-12-28 DIAGNOSIS — R51 Headache: Secondary | ICD-10-CM | POA: Diagnosis present

## 2015-12-28 DIAGNOSIS — J45909 Unspecified asthma, uncomplicated: Secondary | ICD-10-CM | POA: Diagnosis present

## 2015-12-28 DIAGNOSIS — K561 Intussusception: Secondary | ICD-10-CM | POA: Diagnosis not present

## 2015-12-28 DIAGNOSIS — Z881 Allergy status to other antibiotic agents status: Secondary | ICD-10-CM

## 2015-12-28 DIAGNOSIS — N186 End stage renal disease: Secondary | ICD-10-CM

## 2015-12-28 DIAGNOSIS — G44021 Chronic cluster headache, intractable: Secondary | ICD-10-CM

## 2015-12-28 DIAGNOSIS — I9581 Postprocedural hypotension: Secondary | ICD-10-CM | POA: Diagnosis not present

## 2015-12-28 DIAGNOSIS — Z91018 Allergy to other foods: Secondary | ICD-10-CM

## 2015-12-28 DIAGNOSIS — Z888 Allergy status to other drugs, medicaments and biological substances status: Secondary | ICD-10-CM

## 2015-12-28 DIAGNOSIS — Z9119 Patient's noncompliance with other medical treatment and regimen: Secondary | ICD-10-CM

## 2015-12-28 HISTORY — DX: End stage renal disease: N18.6

## 2015-12-28 HISTORY — DX: Dependence on renal dialysis: Z99.2

## 2015-12-28 HISTORY — PX: LAPAROTOMY: SHX154

## 2015-12-28 LAB — COMPREHENSIVE METABOLIC PANEL
ALT: 22 U/L (ref 17–63)
AST: 56 U/L — ABNORMAL HIGH (ref 15–41)
Albumin: 3 g/dL — ABNORMAL LOW (ref 3.5–5.0)
Alkaline Phosphatase: 41 U/L (ref 38–126)
Anion gap: 14 (ref 5–15)
BUN: 42 mg/dL — ABNORMAL HIGH (ref 6–20)
CO2: 28 mmol/L (ref 22–32)
Calcium: 8.4 mg/dL — ABNORMAL LOW (ref 8.9–10.3)
Chloride: 95 mmol/L — ABNORMAL LOW (ref 101–111)
Creatinine, Ser: 11.08 mg/dL — ABNORMAL HIGH (ref 0.61–1.24)
GFR calc Af Amer: 6 mL/min — ABNORMAL LOW (ref >60)
GFR calc non Af Amer: 5 mL/min — ABNORMAL LOW (ref >60)
Glucose, Bld: 98 mg/dL (ref 65–99)
Potassium: 3.7 mmol/L (ref 3.5–5.1)
Sodium: 137 mmol/L (ref 135–145)
Total Bilirubin: 1 mg/dL (ref 0.3–1.2)
Total Protein: 8 g/dL (ref 6.5–8.1)

## 2015-12-28 LAB — LIPASE, BLOOD: Lipase: 43 U/L (ref 11–51)

## 2015-12-28 LAB — URINALYSIS, ROUTINE W REFLEX MICROSCOPIC
Bilirubin Urine: NEGATIVE
Glucose, UA: 100 mg/dL — AB
Ketones, ur: NEGATIVE mg/dL
Leukocytes, UA: NEGATIVE
Nitrite: NEGATIVE
Protein, ur: >300 mg/dL — AB
Specific Gravity, Urine: 1.019 (ref 1.005–1.030)
pH: 8 (ref 5.0–8.0)

## 2015-12-28 LAB — CBC
HCT: 38.3 % — ABNORMAL LOW (ref 39.0–52.0)
Hemoglobin: 12.6 g/dL — ABNORMAL LOW (ref 13.0–17.0)
MCH: 29.5 pg (ref 26.0–34.0)
MCHC: 32.9 g/dL (ref 30.0–36.0)
MCV: 89.7 fL (ref 78.0–100.0)
Platelets: 97 10*3/uL — ABNORMAL LOW (ref 150–400)
RBC: 4.27 MIL/uL (ref 4.22–5.81)
RDW: 15.1 % (ref 11.5–15.5)
WBC: 5 10*3/uL (ref 4.0–10.5)

## 2015-12-28 LAB — URINE MICROSCOPIC-ADD ON: WBC, UA: NONE SEEN WBC/hpf (ref 0–5)

## 2015-12-28 LAB — TROPONIN I: Troponin I: 0.05 ng/mL (ref <0.03)

## 2015-12-28 SURGERY — LAPAROTOMY, EXPLORATORY
Anesthesia: General | Site: Abdomen

## 2015-12-28 MED ORDER — MEPERIDINE HCL 25 MG/ML IJ SOLN: 6.2500 mg | INTRAMUSCULAR | Status: DC | PRN

## 2015-12-28 MED ORDER — METOPROLOL TARTRATE 5 MG/5ML IV SOLN
INTRAVENOUS | Status: DC | PRN
  Administered 2015-12-28 (×2): 2 mg via INTRAVENOUS

## 2015-12-28 MED ORDER — ONDANSETRON HCL 4 MG/2ML IJ SOLN: 4.0000 mg | Freq: Four times a day (QID) | INTRAMUSCULAR | Status: DC | PRN

## 2015-12-28 MED ORDER — ONDANSETRON 4 MG PO TBDP: 8.0000 mg | ORAL_TABLET | Freq: Three times a day (TID) | ORAL | Status: DC | PRN

## 2015-12-28 MED ORDER — ONDANSETRON HCL 4 MG PO TABS: 4.0000 mg | ORAL_TABLET | Freq: Four times a day (QID) | ORAL | Status: DC | PRN

## 2015-12-28 MED ORDER — CEFAZOLIN IN D5W 1 GM/50ML IV SOLN
1.0000 g | Freq: Three times a day (TID) | INTRAVENOUS | Status: DC
  Administered 2015-12-28: 1 g via INTRAVENOUS
  Filled 2015-12-28 (×3): qty 50

## 2015-12-28 MED ORDER — RENA-VITE PO TABS
1.0000 | ORAL_TABLET | Freq: Every day | ORAL | Status: DC
  Administered 2015-12-30 – 2016-01-03 (×4): 1 via ORAL
  Filled 2015-12-28 (×8): qty 1

## 2015-12-28 MED ORDER — CEFAZOLIN IN D5W 1 GM/50ML IV SOLN
1.0000 g | INTRAVENOUS | Status: DC
  Administered 2015-12-29 – 2015-12-30 (×2): 1 g via INTRAVENOUS
  Filled 2015-12-28 (×4): qty 50

## 2015-12-28 MED ORDER — HYDROMORPHONE HCL 2 MG/ML IJ SOLN
1.0000 mg | Freq: Once | INTRAMUSCULAR | Status: DC
  Filled 2015-12-28: qty 1

## 2015-12-28 MED ORDER — LIDOCAINE 2% (20 MG/ML) 5 ML SYRINGE
INTRAMUSCULAR | Status: AC
  Filled 2015-12-28: qty 5

## 2015-12-28 MED ORDER — 0.9 % SODIUM CHLORIDE (POUR BTL) OPTIME
TOPICAL | Status: DC | PRN
  Administered 2015-12-28 (×2): 1000 mL

## 2015-12-28 MED ORDER — SUGAMMADEX SODIUM 200 MG/2ML IV SOLN
INTRAVENOUS | Status: AC
  Filled 2015-12-28: qty 2

## 2015-12-28 MED ORDER — SODIUM CHLORIDE 0.9 % IV BOLUS (SEPSIS)
500.0000 mL | Freq: Once | INTRAVENOUS | Status: AC
  Administered 2015-12-28: 500 mL via INTRAVENOUS

## 2015-12-28 MED ORDER — AMITRIPTYLINE HCL 25 MG PO TABS
12.5000 mg | ORAL_TABLET | Freq: Every day | ORAL | Status: DC
  Administered 2015-12-30 – 2016-01-02 (×3): 12.5 mg via ORAL
  Filled 2015-12-28: qty 1
  Filled 2015-12-28 (×2): qty 0.5
  Filled 2015-12-28: qty 1
  Filled 2015-12-28: qty 0.5
  Filled 2015-12-28: qty 1

## 2015-12-28 MED ORDER — LEVETIRACETAM 500 MG PO TABS
500.0000 mg | ORAL_TABLET | ORAL | Status: DC
  Administered 2015-12-31 – 2016-01-03 (×3): 500 mg via ORAL
  Filled 2015-12-28 (×5): qty 1

## 2015-12-28 MED ORDER — MIDAZOLAM HCL 5 MG/5ML IJ SOLN
INTRAMUSCULAR | Status: DC | PRN
  Administered 2015-12-28: 2 mg via INTRAVENOUS

## 2015-12-28 MED ORDER — HYDROMORPHONE HCL 1 MG/ML IJ SOLN
0.5000 mg | INTRAMUSCULAR | Status: DC | PRN
  Administered 2015-12-28: 0.5 mg via INTRAVENOUS
  Filled 2015-12-28: qty 1

## 2015-12-28 MED ORDER — HYDROMORPHONE HCL 2 MG/ML IJ SOLN
1.0000 mg | Freq: Once | INTRAMUSCULAR | Status: AC
  Administered 2015-12-28: 1 mg via INTRAVENOUS
  Filled 2015-12-28: qty 1

## 2015-12-28 MED ORDER — TRAMADOL HCL 50 MG PO TABS
50.0000 mg | ORAL_TABLET | Freq: Four times a day (QID) | ORAL | Status: DC | PRN
  Administered 2015-12-31 – 2016-01-03 (×3): 50 mg via ORAL
  Filled 2015-12-28 (×3): qty 1

## 2015-12-28 MED ORDER — SODIUM CHLORIDE 0.9 % IJ SOLN
INTRAMUSCULAR | Status: AC
  Filled 2015-12-28: qty 10

## 2015-12-28 MED ORDER — FENTANYL CITRATE (PF) 100 MCG/2ML IJ SOLN
INTRAMUSCULAR | Status: AC
  Filled 2015-12-28: qty 2

## 2015-12-28 MED ORDER — DAPSONE 100 MG PO TABS
100.0000 mg | ORAL_TABLET | Freq: Every day | ORAL | Status: DC
  Administered 2015-12-29 – 2016-01-03 (×4): 100 mg via ORAL
  Filled 2015-12-28 (×8): qty 1

## 2015-12-28 MED ORDER — SUFENTANIL CITRATE 50 MCG/ML IV SOLN
INTRAVENOUS | Status: DC | PRN
  Administered 2015-12-28: 10 ug via INTRAVENOUS
  Administered 2015-12-28: 15 ug via INTRAVENOUS

## 2015-12-28 MED ORDER — PROPOFOL 10 MG/ML IV BOLUS
INTRAVENOUS | Status: DC | PRN
  Administered 2015-12-28: 150 mg via INTRAVENOUS

## 2015-12-28 MED ORDER — DIPHENHYDRAMINE HCL 50 MG/ML IJ SOLN
12.5000 mg | Freq: Once | INTRAMUSCULAR | Status: AC
  Administered 2015-12-28: 12.5 mg via INTRAVENOUS
  Filled 2015-12-28: qty 1

## 2015-12-28 MED ORDER — HYDROMORPHONE HCL 2 MG/ML IJ SOLN
INTRAMUSCULAR | Status: AC
  Filled 2015-12-28: qty 1

## 2015-12-28 MED ORDER — SUGAMMADEX SODIUM 200 MG/2ML IV SOLN
INTRAVENOUS | Status: DC | PRN
  Administered 2015-12-28: 200 mg via INTRAVENOUS

## 2015-12-28 MED ORDER — LIDOCAINE HCL (CARDIAC) 20 MG/ML IV SOLN
INTRAVENOUS | Status: DC | PRN
  Administered 2015-12-28: 100 mg via INTRAVENOUS

## 2015-12-28 MED ORDER — PROMETHAZINE HCL 25 MG/ML IJ SOLN: 6.2500 mg | INTRAMUSCULAR | Status: DC | PRN

## 2015-12-28 MED ORDER — GLYCOPYRROLATE 0.2 MG/ML IJ SOLN
INTRAMUSCULAR | Status: DC | PRN
  Administered 2015-12-28: 0.2 mg via INTRAVENOUS

## 2015-12-28 MED ORDER — DIPHENHYDRAMINE HCL 50 MG/ML IJ SOLN: 12.5000 mg | Freq: Four times a day (QID) | INTRAMUSCULAR | Status: DC | PRN

## 2015-12-28 MED ORDER — SUCROFERRIC OXYHYDROXIDE 500 MG PO CHEW
500.0000 mg | CHEWABLE_TABLET | Freq: Three times a day (TID) | ORAL | Status: DC
  Administered 2016-01-01 – 2016-01-04 (×7): 500 mg via ORAL
  Filled 2015-12-28 (×22): qty 1

## 2015-12-28 MED ORDER — ALBUTEROL SULFATE (2.5 MG/3ML) 0.083% IN NEBU
2.5000 mg | INHALATION_SOLUTION | Freq: Four times a day (QID) | RESPIRATORY_TRACT | Status: DC | PRN
  Administered 2016-01-03: 2.5 mg via RESPIRATORY_TRACT
  Filled 2015-12-28: qty 3

## 2015-12-28 MED ORDER — ABACAVIR SULFATE 300 MG PO TABS
600.0000 mg | ORAL_TABLET | Freq: Every day | ORAL | Status: DC
  Administered 2016-01-01 – 2016-01-03 (×2): 600 mg via ORAL
  Filled 2015-12-28 (×8): qty 2

## 2015-12-28 MED ORDER — ONDANSETRON HCL 4 MG/2ML IJ SOLN
INTRAMUSCULAR | Status: AC
  Filled 2015-12-28: qty 2

## 2015-12-28 MED ORDER — ONDANSETRON HCL 4 MG/2ML IJ SOLN
INTRAMUSCULAR | Status: DC | PRN
  Administered 2015-12-28: 4 mg via INTRAVENOUS

## 2015-12-28 MED ORDER — ACETAMINOPHEN 325 MG PO TABS
650.0000 mg | ORAL_TABLET | Freq: Four times a day (QID) | ORAL | Status: DC | PRN
  Administered 2016-01-04: 650 mg via ORAL
  Filled 2015-12-28: qty 2

## 2015-12-28 MED ORDER — METOPROLOL TARTRATE 5 MG/5ML IV SOLN
INTRAVENOUS | Status: AC
  Filled 2015-12-28: qty 5

## 2015-12-28 MED ORDER — SODIUM CHLORIDE 0.9% FLUSH: 9.0000 mL | INTRAVENOUS | Status: DC | PRN

## 2015-12-28 MED ORDER — SODIUM CHLORIDE 0.9 % IV SOLN
INTRAVENOUS | Status: DC | PRN
  Administered 2015-12-28: 19:00:00 via INTRAVENOUS

## 2015-12-28 MED ORDER — SUFENTANIL CITRATE 50 MCG/ML IV SOLN
INTRAVENOUS | Status: AC
  Filled 2015-12-28: qty 1

## 2015-12-28 MED ORDER — DEXAMETHASONE SODIUM PHOSPHATE 10 MG/ML IJ SOLN
INTRAMUSCULAR | Status: AC
  Filled 2015-12-28: qty 1

## 2015-12-28 MED ORDER — ONDANSETRON HCL 4 MG/2ML IJ SOLN
4.0000 mg | Freq: Once | INTRAMUSCULAR | Status: AC
  Administered 2015-12-28: 4 mg via INTRAVENOUS
  Filled 2015-12-28: qty 2

## 2015-12-28 MED ORDER — DIALYVITE 800 0.8 MG PO WAFR: 800.0000 mg | WAFER | Freq: Every day | ORAL | Status: DC

## 2015-12-28 MED ORDER — SUCCINYLCHOLINE CHLORIDE 20 MG/ML IJ SOLN
INTRAMUSCULAR | Status: DC | PRN
  Administered 2015-12-28: 120 mg via INTRAVENOUS

## 2015-12-28 MED ORDER — ROCURONIUM 10MG/ML (10ML) SYRINGE FOR MEDFUSION PUMP - OPTIME
INTRAVENOUS | Status: DC | PRN
  Administered 2015-12-28: 50 mg via INTRAVENOUS

## 2015-12-28 MED ORDER — SUCCINYLCHOLINE CHLORIDE 200 MG/10ML IV SOSY
PREFILLED_SYRINGE | INTRAVENOUS | Status: AC
  Filled 2015-12-28: qty 10

## 2015-12-28 MED ORDER — HYDROMORPHONE HCL 1 MG/ML IJ SOLN
1.0000 mg | Freq: Once | INTRAMUSCULAR | Status: AC
  Administered 2015-12-28: 1 mg via INTRAVENOUS
  Filled 2015-12-28: qty 1

## 2015-12-28 MED ORDER — HYDRALAZINE HCL 50 MG PO TABS: 100.0000 mg | ORAL_TABLET | ORAL | Status: DC

## 2015-12-28 MED ORDER — CEFAZOLIN SODIUM 1 G IJ SOLR
INTRAMUSCULAR | Status: DC | PRN
  Administered 2015-12-28: 2 g via INTRAMUSCULAR

## 2015-12-28 MED ORDER — HYDROMORPHONE 1 MG/ML IV SOLN
INTRAVENOUS | Status: DC
  Administered 2015-12-29: 4.8 mg via INTRAVENOUS
  Administered 2015-12-29: 02:00:00 via INTRAVENOUS
  Administered 2015-12-29: 1.6 mg via INTRAVENOUS
  Administered 2015-12-29: 3.2 mg via INTRAVENOUS
  Administered 2015-12-30: 2 mg via INTRAVENOUS
  Administered 2015-12-30: 0.8 mg via INTRAVENOUS
  Administered 2015-12-30: 2.2 mg via INTRAVENOUS
  Administered 2015-12-30: 19:00:00 via INTRAVENOUS
  Administered 2015-12-30: 4.2 mg via INTRAVENOUS
  Administered 2015-12-30: 2.2 mg via INTRAVENOUS
  Administered 2015-12-31: 1 mg via INTRAVENOUS
  Administered 2015-12-31: 3.4 mg via INTRAVENOUS
  Administered 2015-12-31: 0 mg via INTRAVENOUS
  Filled 2015-12-28 (×3): qty 25

## 2015-12-28 MED ORDER — IOPAMIDOL (ISOVUE-300) INJECTION 61%
INTRAVENOUS | Status: AC
  Filled 2015-12-28: qty 30

## 2015-12-28 MED ORDER — LOSARTAN POTASSIUM 50 MG PO TABS: 100.0000 mg | ORAL_TABLET | Freq: Every day | ORAL | Status: DC

## 2015-12-28 MED ORDER — PROPOFOL 10 MG/ML IV BOLUS
INTRAVENOUS | Status: AC
  Filled 2015-12-28: qty 20

## 2015-12-28 MED ORDER — CARVEDILOL 12.5 MG PO TABS
12.5000 mg | ORAL_TABLET | Freq: Two times a day (BID) | ORAL | Status: DC
  Administered 2015-12-29 – 2016-01-03 (×9): 12.5 mg via ORAL
  Filled 2015-12-28 (×10): qty 1

## 2015-12-28 MED ORDER — LEVETIRACETAM 250 MG PO TABS
250.0000 mg | ORAL_TABLET | ORAL | Status: DC
  Administered 2015-12-30 – 2016-01-01 (×2): 250 mg via ORAL
  Filled 2015-12-28 (×3): qty 1

## 2015-12-28 MED ORDER — AZITHROMYCIN 600 MG PO TABS
1200.0000 mg | ORAL_TABLET | ORAL | Status: DC
  Administered 2015-12-29: 1200 mg via ORAL
  Filled 2015-12-28 (×2): qty 2

## 2015-12-28 MED ORDER — DOCUSATE SODIUM 100 MG PO CAPS
100.0000 mg | ORAL_CAPSULE | Freq: Two times a day (BID) | ORAL | Status: DC
  Administered 2015-12-29 – 2016-01-03 (×8): 100 mg via ORAL
  Filled 2015-12-28 (×12): qty 1

## 2015-12-28 MED ORDER — NALOXONE HCL 0.4 MG/ML IJ SOLN: 0.4000 mg | INTRAMUSCULAR | Status: DC | PRN

## 2015-12-28 MED ORDER — ONDANSETRON HCL 4 MG/2ML IJ SOLN
4.0000 mg | Freq: Four times a day (QID) | INTRAMUSCULAR | Status: DC | PRN
  Administered 2016-01-03: 4 mg via INTRAVENOUS
  Filled 2015-12-28: qty 2

## 2015-12-28 MED ORDER — GLYCOPYRROLATE 0.2 MG/ML IV SOSY
PREFILLED_SYRINGE | INTRAVENOUS | Status: AC
  Filled 2015-12-28: qty 3

## 2015-12-28 MED ORDER — SODIUM CHLORIDE 0.9% FLUSH
3.0000 mL | Freq: Two times a day (BID) | INTRAVENOUS | Status: DC
  Administered 2015-12-29 – 2016-01-03 (×9): 3 mL via INTRAVENOUS

## 2015-12-28 MED ORDER — MIDAZOLAM HCL 2 MG/2ML IJ SOLN
INTRAMUSCULAR | Status: AC
  Filled 2015-12-28: qty 2

## 2015-12-28 MED ORDER — SODIUM CHLORIDE 0.9 % IV SOLN
INTRAVENOUS | Status: DC
  Administered 2015-12-28: 19:00:00 via INTRAVENOUS

## 2015-12-28 MED ORDER — HYDROMORPHONE HCL 1 MG/ML IJ SOLN
0.2500 mg | INTRAMUSCULAR | Status: DC | PRN
  Administered 2015-12-28 (×2): 0.5 mg via INTRAVENOUS

## 2015-12-28 MED ORDER — DEXAMETHASONE SODIUM PHOSPHATE 10 MG/ML IJ SOLN
INTRAMUSCULAR | Status: DC | PRN
  Administered 2015-12-28: 10 mg via INTRAVENOUS

## 2015-12-28 MED ORDER — DARUNAVIR-COBICISTAT 800-150 MG PO TABS
1.0000 | ORAL_TABLET | Freq: Every evening | ORAL | Status: DC
  Administered 2016-01-01 – 2016-01-03 (×2): 1 via ORAL
  Filled 2015-12-28 (×8): qty 1

## 2015-12-28 MED ORDER — METRONIDAZOLE IN NACL 5-0.79 MG/ML-% IV SOLN
500.0000 mg | Freq: Once | INTRAVENOUS | Status: AC
  Administered 2015-12-28: 500 mg via INTRAVENOUS
  Filled 2015-12-28: qty 100

## 2015-12-28 MED ORDER — DIPHENHYDRAMINE HCL 12.5 MG/5ML PO ELIX
12.5000 mg | ORAL_SOLUTION | Freq: Four times a day (QID) | ORAL | Status: DC | PRN
  Filled 2015-12-28: qty 5

## 2015-12-28 MED ORDER — DOLUTEGRAVIR SODIUM 50 MG PO TABS
50.0000 mg | ORAL_TABLET | Freq: Every evening | ORAL | Status: DC
  Administered 2016-01-01 – 2016-01-03 (×2): 50 mg via ORAL
  Filled 2015-12-28 (×7): qty 1

## 2015-12-28 MED ORDER — ACETAMINOPHEN 650 MG RE SUPP: 650.0000 mg | Freq: Four times a day (QID) | RECTAL | Status: DC | PRN

## 2015-12-28 SURGICAL SUPPLY — 43 items
BLADE SURG ROTATE 9660 (MISCELLANEOUS) ×1 IMPLANT
CANISTER SUCTION 2500CC (MISCELLANEOUS) ×2 IMPLANT
CHLORAPREP W/TINT 26ML (MISCELLANEOUS) ×2 IMPLANT
COVER SURGICAL LIGHT HANDLE (MISCELLANEOUS) ×2 IMPLANT
DRAPE LAPAROSCOPIC ABDOMINAL (DRAPES) ×2 IMPLANT
DRAPE WARM FLUID 44X44 (DRAPE) ×2 IMPLANT
DRSG OPSITE POSTOP 4X10 (GAUZE/BANDAGES/DRESSINGS) ×1 IMPLANT
DRSG OPSITE POSTOP 4X8 (GAUZE/BANDAGES/DRESSINGS) IMPLANT
DRSG TELFA 3X8 NADH (GAUZE/BANDAGES/DRESSINGS) ×2 IMPLANT
ELECT BLADE 6.5 EXT (BLADE) ×1 IMPLANT
ELECT CAUTERY BLADE 6.4 (BLADE) ×2 IMPLANT
ELECT REM PT RETURN 9FT ADLT (ELECTROSURGICAL) ×2
ELECTRODE REM PT RTRN 9FT ADLT (ELECTROSURGICAL) ×1 IMPLANT
GLOVE BIO SURGEON STRL SZ8 (GLOVE) ×2 IMPLANT
GLOVE BIOGEL PI IND STRL 8 (GLOVE) ×1 IMPLANT
GLOVE BIOGEL PI INDICATOR 8 (GLOVE) ×1
GOWN STRL REUS W/ TWL LRG LVL3 (GOWN DISPOSABLE) ×1 IMPLANT
GOWN STRL REUS W/ TWL XL LVL3 (GOWN DISPOSABLE) ×1 IMPLANT
GOWN STRL REUS W/TWL LRG LVL3 (GOWN DISPOSABLE) ×2
GOWN STRL REUS W/TWL XL LVL3 (GOWN DISPOSABLE) ×2
KIT BASIN OR (CUSTOM PROCEDURE TRAY) ×2 IMPLANT
KIT ROOM TURNOVER OR (KITS) ×2 IMPLANT
NS IRRIG 1000ML POUR BTL (IV SOLUTION) ×4 IMPLANT
PACK GENERAL/GYN (CUSTOM PROCEDURE TRAY) ×2 IMPLANT
PAD ARMBOARD 7.5X6 YLW CONV (MISCELLANEOUS) ×2 IMPLANT
PAD DRESSING TELFA 3X8 NADH (GAUZE/BANDAGES/DRESSINGS) IMPLANT
RELOAD PROXIMATE 75MM BLUE (ENDOMECHANICALS) ×4 IMPLANT
RELOAD STAPLE 75 3.8 BLU REG (ENDOMECHANICALS) IMPLANT
SEALER TISSUE X1 CVD JAW (INSTRUMENTS) ×1 IMPLANT
SPECIMEN JAR LARGE (MISCELLANEOUS) ×1 IMPLANT
SPONGE LAP 18X18 X RAY DECT (DISPOSABLE) ×1 IMPLANT
STAPLER GUN LINEAR PROX 60 (STAPLE) ×1 IMPLANT
STAPLER PROXIMATE 75MM BLUE (STAPLE) ×1 IMPLANT
STAPLER VISISTAT 35W (STAPLE) ×2 IMPLANT
SUCTION POOLE TIP (SUCTIONS) ×2 IMPLANT
SUT PDS AB 1 TP1 96 (SUTURE) ×4 IMPLANT
SUT SILK 2 0 SH CR/8 (SUTURE) ×2 IMPLANT
SUT SILK 2 0 TIES 10X30 (SUTURE) ×2 IMPLANT
SUT SILK 3 0 SH CR/8 (SUTURE) ×2 IMPLANT
SUT SILK 3 0 TIES 10X30 (SUTURE) ×2 IMPLANT
TOWEL OR 17X26 10 PK STRL BLUE (TOWEL DISPOSABLE) ×2 IMPLANT
TRAY FOLEY CATH 16FRSI W/METER (SET/KITS/TRAYS/PACK) ×1 IMPLANT
YANKAUER SUCT BULB TIP NO VENT (SUCTIONS) ×1 IMPLANT

## 2015-12-28 NOTE — Anesthesia Postprocedure Evaluation (Signed)
Anesthesia Post Note  Patient: Jim Wyatt  Procedure(s) Performed: Procedure(s) (LRB): EXPLORATORY LAPAROTOMY BOWEL RESECTION ILEOCECECTOMY (N/A)  Patient location during evaluation: PACU Anesthesia Type: General Level of consciousness: sedated and patient cooperative Pain management: pain level controlled Vital Signs Assessment: post-procedure vital signs reviewed and stable Respiratory status: spontaneous breathing Cardiovascular status: stable Anesthetic complications: no    Last Vitals:  Vitals:   12/28/15 2100 12/28/15 2111  BP: (!) 191/135 (!) 185/140  Pulse: 100 (!) 107  Resp: 13 15  Temp:  36.3 C    Last Pain:  Vitals:   12/28/15 2111  TempSrc:   PainSc: [place]

## 2015-12-28 NOTE — Transfer of Care (Signed)
Immediate Anesthesia Transfer of Care Note  Patient: Jim Wyatt  Procedure(s) Performed: Procedure(s): EXPLORATORY LAPAROTOMY BOWEL RESECTION ILEOCECECTOMY (N/A)  Patient Location: PACU  Anesthesia Type:General  Level of Consciousness: sedated, patient cooperative and responds to stimulation  Airway & Oxygen Therapy: Patient Spontanous Breathing and Patient connected to nasal cannula oxygen  Post-op Assessment: Report given to RN, Post -op Vital signs reviewed and stable and Patient moving all extremities X 4  Post vital signs: Reviewed and stable  Last Vitals:  Vitals:   12/28/15 1745 12/28/15 2044  BP: (!) 172/124 (!) 193/139  Pulse: 103 100  Resp: 13 17  Temp:  36.3 C    Last Pain:  Vitals:   12/28/15 2044  TempSrc:   PainSc: Asleep         Complications: No apparent anesthesia complications

## 2015-12-28 NOTE — Progress Notes (Signed)
Arrived with HD needles intact in left arm.   I removed both needles and held pressure for 10 minutes over each site.   Hemostasis achieved.    Pressure drsgs applied.

## 2015-12-28 NOTE — ED Notes (Signed)
Signed Consent at the bedside

## 2015-12-28 NOTE — Anesthesia Procedure Notes (Signed)
Procedure Name: Intubation Date/Time: 12/28/2015 7:03 PM Performed by: Claris Che Pre-anesthesia Checklist: Patient identified, Emergency Drugs available, Suction available, Patient being monitored and Timeout performed Patient Re-evaluated:Patient Re-evaluated prior to inductionOxygen Delivery Method: Circle system utilized Preoxygenation: Pre-oxygenation with 100% oxygen Intubation Type: IV induction, Rapid sequence and Cricoid Pressure applied Laryngoscope Size: Mac and 3 Grade View: Grade II Tube type: Oral Tube size: 7.5 mm Number of attempts: 1 Airway Equipment and Method: Stylet Placement Confirmation: ETT inserted through vocal cords under direct vision,  positive ETCO2 and breath sounds checked- equal and bilateral Secured at: 22 cm Tube secured with: Tape Dental Injury: Teeth and Oropharynx as per pre-operative assessment

## 2015-12-28 NOTE — ED Notes (Signed)
Attempted Report x 1 to Short Stay

## 2015-12-28 NOTE — ED Provider Notes (Signed)
[facility] DEPT Provider Note   CSN: [id number] Arrival date & time: 12/28/15  1004     History   Chief Complaint Chief Complaint  Patient presents with  . Abdominal Pain    HPI Jim Wyatt is a 35 y.o. male.  HPI  35 year old male presents with acute abdominal pain that started 2 hours ago on dialysis. Jim Wyatt was about 2 hours into his 3 hour and 45 minute session. Today's Tuesday, Jim Wyatt last had dialysis Saturday. Jim Wyatt also had concomitant acute shortness of breath. No chest pain. Had nausea and vomiting a couple days ago but none since and none now. No diarrhea. Has never had abdominal pain like this before. Denies any abdominal surgeries. Currently his pain is severe. Jim Wyatt states Jim Wyatt did take his blood pressure medicine this morning. Is not on any blood thinners.  Past Medical History:  Diagnosis Date  . Asthma   . CAP (community acquired pneumonia) 10/2013  . Cellulitis 11/2013   LLE  . Chronic kidney disease   . DVT (deep venous thrombosis) ([place]) 10/2013   BLE  . Fever   . Headache   . HIV infection ([place]) dx'd 10/2013  . Hypertension   . Leg pain   . Multiple environmental allergies    "trees, dogs, cats"  . Multiple food allergies   . Shingles < 2010  . 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 Otitis externa of both ears 11/21/2015  . AIDS (acquired immunodeficiency syndrome), CD4 <=200 ([place])   . Meningitis   . Cephalalgia   . Blurry vision, bilateral   . Headache   . Hypertensive urgency   . New onset of headache in immunocompromised patient ([place]) 07/06/2015  . ESRD on dialysis ([place]) 04/27/2015  . CAP (community acquired pneumonia) 02/25/2015  . ESRD (end stage renal disease) ([place]) 02/16/2015  . Swelling of right knee joint 01/12/2015  . History of DVT (deep vein thrombosis) 01/12/2015  . Anemia 01/12/2015  . Acute blood loss anemia   . Candidal esophagitis ([place])   . Constipation   . Nausea  without vomiting 12/20/2014  . Abscess of right thigh 12/19/2014  . Chronic kidney disease (CKD), stage IV (severe) ([place]) 12/19/2014  . 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 Anemia due to end stage renal disease ([place]) 12/19/2014  . Hyponatremia 12/19/2014  . Community acquired pneumonia 07/30/2014  . Sepsis ([place]) 07/30/2014  . Metabolic acidosis 50/10/3816  . Anemia of chronic disease 07/30/2014  . Thrombocytopenia ([place]) 07/30/2014  . Acute on chronic renal failure ([place])   . 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 Human immunodeficiency virus (HIV) disease ([place]) 12/11/2013  . Hospital discharge follow-up 12/11/2013  . CMV (cytomegalovirus infection) ([place]) 12/07/2013  . Protein-calorie malnutrition, severe ([place]) 12/06/2013  . 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 DVT (deep venous thrombosis) ([place]) 11/24/2013  . Asthma, chronic 11/24/2013  . Acquired immune deficiency syndrome ([place]) 11/14/2013  . AKI (acute kidney injury) ([place]) 11/11/2013  . Elevated liver enzymes 11/09/2013  . History of shingles 11/09/2013  . Obesity 11/09/2013  . Pneumonia   . Asthma     Past Surgical History:  Procedure Laterality Date  . AV FISTULA PLACEMENT Left 03/03/2015   Procedure: Creation of Left Arm RADIOCEPHALIC ARTERIOVENOUS FISTULA ;  Surgeon: Mal Misty, MD;  Location: [facility];  Service: Vascular;  Laterality: Left;  . FEMUR FRACTURE SURGERY Right 09/2001  . FRACTURE SURGERY    .  I&D EXTREMITY Right 12/18/2014   Procedure: IRRIGATION AND DEBRIDEMENT RIGHT LOWER EXTREMITY;  Surgeon: Ralene Ok, MD;  Location: [facility];  Service: General;  Laterality: Right;  . INSERTION OF DIALYSIS CATHETER  2016       Home Medications    Prior to Admission medications   Medication Sig Start Date End Date Taking? Authorizing Provider  abacavir (ZIAGEN) 300 MG tablet Take 2 tablets (600 mg total) by mouth daily with supper. 12/14/15  Yes Carlyle Basques, MD  acetaminophen (TYLENOL) 500 MG tablet Take 1,000 mg by mouth every 6 (six) hours as needed (pain).   Yes Historical Provider, MD  albuterol (PROVENTIL HFA;VENTOLIN HFA) 108 (90 Base) MCG/ACT inhaler Inhale 2 puffs into the lungs every 6 (six) hours as needed for wheezing or shortness of breath. 07/10/15  Yes Jule Ser, DO  CARTIA XT 180 MG 24 hr capsule Take 180 mg by mouth daily. 12/11/15  Yes Historical Provider, MD  carvedilol (COREG) 25 MG tablet Take 25 mg by mouth 2 (two) times daily. 12/02/15  Yes Historical Provider, MD  dapsone 100 MG tablet Take 1 tablet (100 mg total) by mouth daily. 11/24/15  Yes Alexa Angela Burke, MD  darunavir-cobicistat (PREZCOBIX) 800-150 MG tablet Take 1 tablet by mouth every evening. Swallow whole. Do NOT crush, break or chew tablets. Take with food. 12/14/15  Yes Carlyle Basques, MD  diphenhydrAMINE (BENADRYL) 25 MG tablet Take 25 mg by mouth every 4 (four) hours as needed for allergies.    Yes Historical Provider, MD  dolutegravir (TIVICAY) 50 MG tablet Take 1 tablet (50 mg total) by mouth every evening. 12/14/15  Yes Carlyle Basques, MD  hydrALAZINE (APRESOLINE) 100 MG tablet Take 100 mg by mouth 3 (three) times daily. 6:30am, 2:30pm, 11pm - for high blood pressure   Yes Historical Provider, MD  levETIRAcetam (KEPPRA) 250 MG tablet Take 1 tablet (250 mg total) by mouth See admin instructions. Take 1 tablet after your dialysis treatments. Patient taking differently: Take 250 mg by mouth See admin instructions. Take 1 tablet (250 mg) by mouth on Tuesday, Thursday, Saturday at 7pm (dialysis days) 11/05/15  Yes Nona Dell, PA-C  levETIRAcetam (KEPPRA) 500 MG tablet Take 500 mg by mouth See admin instructions. Take 1 tablet (500 mg) by mouth Sunday, Monday, Wednesday, Friday at 7pm (non-dialysis days)   Yes Historical Provider, MD  losartan (COZAAR) 100 MG tablet Take 100 mg by mouth at bedtime.   Yes Historical Provider, MD  ondansetron (ZOFRAN ODT)  8 MG disintegrating tablet Take 1 tablet (8 mg total) by mouth every 8 (eight) hours as needed for nausea or vomiting. 10/06/15  Yes Carlyle Basques, MD  sucroferric oxyhydroxide (VELPHORO) 500 MG chewable tablet Chew 500 mg by mouth See admin instructions. Chew 1 tablet (500 mg) by mouth after every meal (2-3 times daily)   Yes Historical Provider, MD  calcium carbonate (TUMS - DOSED IN MG ELEMENTAL CALCIUM) 500 MG chewable tablet Chew 1 tablet (200 mg of elemental calcium total) by mouth 3 (three) times daily with meals. Patient not taking: Reported on 12/28/2015 07/10/15   Jule Ser, DO    Family History Family History  Problem Relation Age of Onset  . Hypertension Mother   . Diabetes Maternal Grandmother   . Kidney disease Maternal Grandmother     Social History Social History  Substance Use Topics  . Smoking status: Never Smoker  . Smokeless tobacco: Never Used  . Alcohol use No     Comment:  I stopped drinking along time ago "     Allergies   Bactrim [sulfamethoxazole-trimethoprim]; Vancomycin; Reglan [metoclopramide]; Amlodipine; and Augmentin [amoxicillin-pot clavulanate]   Review of Systems Review of Systems  Respiratory: Positive for shortness of breath.   Cardiovascular: Negative for chest pain.  Gastrointestinal: Positive for abdominal pain, nausea and vomiting. Negative for diarrhea.  Musculoskeletal: Negative for back pain.  All other systems reviewed and are negative.    Physical Exam Updated Vital Signs BP (!) 188/131   Pulse 98   Temp 97.6 F (36.4 C) (Oral)   Resp 24   Ht 5\' 7"  (1.702 m)   Wt 147 lb 11.3 oz (67 kg)   SpO2 93%   BMI 23.13 kg/m   Physical Exam  Constitutional: Jim Wyatt is oriented to person, place, and time. Jim Wyatt appears well-developed and well-nourished.  HENT:  Head: Normocephalic and atraumatic.  Right Ear: External ear normal.  Left Ear: External ear normal.  Nose: Nose normal.  Eyes: Right eye exhibits no discharge. Left eye  exhibits no discharge.  Neck: Neck supple.  Cardiovascular: Normal rate, regular rhythm and normal heart sounds.   Pulses:      Dorsalis pedis pulses are 2+ on the right side, and 2+ on the left side.  Pulmonary/Chest: Effort normal and breath sounds normal.  Abdominal: Soft. Jim Wyatt exhibits no distension. There is tenderness (diffuse). There is guarding.  Musculoskeletal: Jim Wyatt exhibits no edema.  Neurological: Jim Wyatt is alert and oriented to person, place, and time.  Skin: Skin is warm. Jim Wyatt is diaphoretic.  Nursing note and vitals reviewed.    ED Treatments / Results  Labs (all labs ordered are listed, but only abnormal results are displayed) Labs Reviewed  COMPREHENSIVE METABOLIC PANEL - Abnormal; Notable for the following:       Result Value   Chloride 95 (*)    BUN 42 (*)    Creatinine, Ser 11.08 (*)    Calcium 8.4 (*)    Albumin 3.0 (*)    AST 56 (*)    GFR calc non Af Amer 5 (*)    GFR calc Af Amer 6 (*)    All other components within normal limits  CBC - Abnormal; Notable for the following:    Hemoglobin 12.6 (*)    HCT 38.3 (*)    Platelets 97 (*)    All other components within normal limits  TROPONIN I - Abnormal; Notable for the following:    Troponin I 0.05 (*)    All other components within normal limits  LIPASE, BLOOD  URINALYSIS, ROUTINE W REFLEX MICROSCOPIC (NOT AT [facility])  I-STAT CG4 LACTIC ACID, ED    EKG  EKG Interpretation None       Radiology Ct Abdomen Pelvis Wo Contrast  Result Date: 12/28/2015 CLINICAL DATA:  Abdominal pain starting during dialysis today. Hypertension. EXAM: CT ABDOMEN AND PELVIS WITHOUT CONTRAST TECHNIQUE: Multidetector CT imaging of the abdomen and pelvis was performed following the standard protocol without IV contrast. COMPARISON:  Multiple exams, including the 11/07/2013 FINDINGS: Lower chest: Mild to moderate cardiomegaly. Hepatobiliary: Unremarkable Pancreas: Unremarkable Spleen: Unremarkable Adrenals/Urinary Tract: Unremarkable  Stomach/Bowel: Suspected ileocolic intussusception, about 5.5 cm in length, with dilated right colon at about 6.3 cm diameter. Possible bowel wall thickening and irregularity in this region. I do not see dilated proximal loops of small bowel. Appendix not visible. Vascular/Lymphatic: No significant atherosclerotic calcification. Indistinctness of tissue planes around the aorta and IVC. Inguinal lymph nodes up to about 1.3 cm in short axis.  Reduced sensitivity for adenopathy. Reproductive: Unremarkable Other: Pelvic ascites. Ascites in the right paracolic gutter. Diffuse edema in the subcutaneous and mesenteric tissues which in conjunction with the lack of oral and IV contrast causes poor definition of structures intra-abdominally. 1.9 by 3.9 by 2.8 cm area of accentuated density in the upper midline subcutaneous tissues, probably a small herniation of omentum. Intramedullary nail in the right femur only partially seen. Musculoskeletal: Unremarkable IMPRESSION: 1. Dilated and potentially inflamed loop of bowel in the right abdomen is thought to be colon, and there appears to be incorporation of adipose tissue into the center of this structure strongly favoring ileocolic intussusception. 2. Because of the severity of fluid infiltration in the mesentery and fatty tissues, as well as the lack of oral and IV contrast, this abnormality in the right abdomen is not entirely specific. I do favor intussusception. The fluid infiltration could be due to hypoproteinemia/hypoalbuminemia or right heart failure. 3. Suspected small ventral upper abdominal hernia containing omental adipose tissue. 4. Mild to moderate enlargement of the heart. 5. Ascites in the right paracolic gutter and a small amount of pelvic ascites. Electronically Signed   By: Van Clines M.D.   On: 12/28/2015 14:20   Dg Chest 2 View  Result Date: 12/28/2015 CLINICAL DATA:  35 year old male with generalized abdominal pain and shortness of breath after  2 hours of dialysis EXAM: CHEST  2 VIEW COMPARISON:  Prior chest x-ray 07/06/2015 FINDINGS: Stable cardiomegaly. Mediastinal contours are within normal limits. Mild scratch then no significant vascular congestion, pulmonary edema, pleural effusion or pneumothorax. No evidence of free air under the diaphragm. No focal airspace consolidation. Osseous structures are intact and unremarkable. The previously noted right IJ approach tunneled hemodialysis catheter has been removed. IMPRESSION: 1. Stable cardiomegaly. 2. No evidence of edema or other acute cardiopulmonary process. Electronically Signed   By: Jacqulynn Cadet M.D.   On: 12/28/2015 13:10    Procedures Procedures (including critical care time)  Medications Ordered in ED Medications  iopamidol (ISOVUE-300) 61 % injection (not administered)  albuterol (PROVENTIL HFA;VENTOLIN HFA) 108 (90 Base) MCG/ACT inhaler 2 puff (not administered)  azithromycin (ZITHROMAX) tablet 1,200 mg (not administered)  abacavir (ZIAGEN) tablet 600 mg (not administered)  DIALYVITE 800 WAFR 800 mg (not administered)  carvedilol (COREG) tablet 12.5 mg (not administered)  dapsone tablet 100 mg (not administered)  darunavir-cobicistat (PREZCOBIX) 800-150 MG per tablet 1 tablet (not administered)  hydrALAZINE (APRESOLINE) tablet 100 mg (not administered)  levETIRAcetam (KEPPRA) tablet 250 mg (not administered)  losartan (COZAAR) tablet 100 mg (not administered)  ondansetron (ZOFRAN-ODT) disintegrating tablet 8 mg (not administered)  multivitamin (RENA-VIT) tablet 1 tablet (not administered)  sucroferric oxyhydroxide (VELPHORO) chewable tablet 500 mg (not administered)  dolutegravir (TIVICAY) tablet 50 mg (not administered)  ceFAZolin (ANCEF) IVPB 1 g/50 mL premix (1 g Intravenous New Bag/Given 12/28/15 1605)  HYDROmorphone (DILAUDID) injection 1 mg (1 mg Intravenous Given 12/28/15 1121)  ondansetron (ZOFRAN) injection 4 mg (4 mg Intravenous Given 12/28/15 1158)    HYDROmorphone (DILAUDID) injection 1 mg (1 mg Intravenous Given 12/28/15 1246)  HYDROmorphone (DILAUDID) injection 1 mg (1 mg Intravenous Given 12/28/15 1341)  sodium chloride 0.9 % bolus 500 mL (500 mLs Intravenous New Bag/Given 12/28/15 1535)  diphenhydrAMINE (BENADRYL) injection 12.5 mg (12.5 mg Intravenous Given 12/28/15 1602)     Initial Impression / Assessment and Plan / ED Course  I have reviewed the triage vital signs and the nursing notes.  Pertinent labs & imaging results that were available during  my care of the patient were reviewed by me and considered in my medical decision making (see chart for details).  Clinical Course  Comment By Time  Patient is diffusely tender. Good pulses, currently with HTN. Will get CT, labs, ECG and chest xray. Dilaudid for pain Sherwood Gambler, MD 10/31 1035    Patient's pain is better after multiple doses of Dilaudid. His CT is concerning for a right upper quadrant intussusception. Will add on a lactic acid, and initial white blood cell count is normal. Discussed with surgery, PA will, who will come evaluate. Given multiple comorbidities, internal medicine teaching service will admit.  Final Clinical Impressions(s) / ED Diagnoses   Final diagnoses:  Intussusception [facility])    New Prescriptions New Prescriptions   No medications on file     Sherwood Gambler, MD 12/28/15 1607

## 2015-12-28 NOTE — ED Notes (Signed)
Pt returned from CT °

## 2015-12-28 NOTE — ED Notes (Signed)
Delay in lab draw, doctors still examining pt.

## 2015-12-28 NOTE — ED Notes (Signed)
Asked pt for a urine sample. Pt stated that he was unable to go at this time. Will ask later.

## 2015-12-28 NOTE — Consult Note (Signed)
Reason for Consult: Possible intussception Referring Physician: [name] is an 35 y.o. male.  HPI: Pt with PMH focal segmental glomerulonephritis, CMV, HIV, HTN, on HD today and started having generalized, non-radiating abdominal pain after 2 hours on HD. Denies pain like this in the past. Associated symptoms include nausea and one episode of vomiting. Taken off HD and brought to the ED, hypertensive, afebrile, and diaphoretic. He denies hematochezia, melena, and hematemesis. Last BM was today and was normal. No history of abdominal surgeries. Not on any blood thinning medications.  ED workup:   CT scan: Suspected ileocolic intussusception, about 5.5 cm in length, with dilated right colon at about 6.3 cm diameter. Possible bowel wall thickening and irregularity in  this region. I do not see dilated proximal loops of small bowel. Appendix not visible. Small ventral upper abdominal hernia containing omental adipose tissue. Ascites in the  right paracolic gutter and a small amount of pelvic ascites.  CXR stable cardiomegaly  Troponin 0.05  WBC 5.0  BUN 42/ SCr 11.08  Lactate Pending  RPR negative   Past Medical History:  Diagnosis Date  . Asthma   . CAP (community acquired pneumonia) 10/2013  . Cellulitis 11/2013   LLE  . Chronic kidney disease   . DVT (deep venous thrombosis) ([place]) 10/2013   BLE  . Fever   . Headache   . HIV infection ([place]) dx'd 10/2013  . Hypertension   . Leg pain   . Multiple environmental allergies    "trees, dogs, cats"  . Multiple food allergies   . Shingles < 2010  . 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 AV FISTULA PLACEMENT Left 03/03/2015   Procedure: Creation of Left Arm RADIOCEPHALIC ARTERIOVENOUS FISTULA ;  Surgeon: Mal Misty, MD;  Location: [facility];  Service: Vascular;  Laterality: Left;  . FEMUR FRACTURE SURGERY Right 09/2001  . FRACTURE SURGERY    . I&D EXTREMITY  Right 12/18/2014   Procedure: IRRIGATION AND DEBRIDEMENT RIGHT LOWER EXTREMITY;  Surgeon: Ralene Ok, MD;  Location: [facility];  Service: General;  Laterality: Right;  . INSERTION OF DIALYSIS CATHETER  2016    Family History  Problem Relation Age of Onset  . Hypertension Mother   . Diabetes Maternal Grandmother   . Kidney disease Maternal Grandmother     Social History:  reports that he has never smoked. He has never used smokeless tobacco. He reports that he does not drink alcohol or use drugs.  Allergies:  Allergies  Allergen Reactions  . Bactrim [Sulfamethoxazole-Trimethoprim] Other (See Comments)    Renal failure  . Cauliflower [Brassica Oleracea Italica] Anaphylaxis and Other (See Comments)    Pt states his throat closes up  . Coconut Flavor Hives and Shortness Of Breath  . Fish Allergy Hives and Shortness Of Breath    Throat closes up  . Onion Hives and Shortness Of Breath  . Vancomycin Other (See Comments)    Patient states vancomycin caused kidney injury  . Peanut-Containing Drug Products Hives  . Reglan [Metoclopramide] Other (See Comments)    Causes ticks/ jerks  . Amlodipine Palpitations and Other (See Comments)    fatigue  . Augmentin [Amoxicillin-Pot Clavulanate] Hives  . Banana Other (See Comments)    Per allergy test    Medications: I have reviewed the patient's current medications.  Results for orders placed or performed during the hospital encounter of 12/28/15 (from the past 48 hour(s))  Lipase, blood     Status: None   Collection Time: 12/28/15 10:11 AM  Result Value Ref Range   Lipase 43 11 - 51 U/L  Comprehensive metabolic panel     Status: Abnormal   Collection Time: 12/28/15 10:11 AM  Result Value Ref Range   Sodium 137 135 - 145 mmol/L   Potassium 3.7 3.5 - 5.1 mmol/L    Comment: SLIGHT HEMOLYSIS   Chloride 95 (L) 101 - 111 mmol/L   CO2 28 22 - 32 mmol/L   Glucose, Bld 98 65 - 99 mg/dL   BUN 42 (H) 6 - 20 mg/dL   Creatinine, Ser 11.08 (H)  0.61 - 1.24 mg/dL   Calcium 8.4 (L) 8.9 - 10.3 mg/dL   Total Protein 8.0 6.5 - 8.1 g/dL   Albumin 3.0 (L) 3.5 - 5.0 g/dL   AST 56 (H) 15 - 41 U/L   ALT 22 17 - 63 U/L   Alkaline Phosphatase 41 38 - 126 U/L   Total Bilirubin 1.0 0.3 - 1.2 mg/dL   GFR calc non Af Amer 5 (L) >60 mL/min   GFR calc Af Amer 6 (L) >60 mL/min    Comment: (NOTE) The eGFR has been calculated using the CKD EPI equation. This calculation has not been validated in all clinical situations. eGFR's persistently <60 mL/min signify possible Chronic Kidney Disease.    Anion gap 14 5 - 15  CBC     Status: Abnormal   Collection Time: 12/28/15 10:11 AM  Result Value Ref Range   WBC 5.0 4.0 - 10.5 K/uL   RBC 4.27 4.22 - 5.81 MIL/uL   Hemoglobin 12.6 (L) 13.0 - 17.0 g/dL   HCT 38.3 (L) 39.0 - 52.0 %   MCV 89.7 78.0 - 100.0 fL   MCH 29.5 26.0 - 34.0 pg   MCHC 32.9 30.0 - 36.0 g/dL   RDW 15.1 11.5 - 15.5 %   Platelets 97 (L) 150 - 400 K/uL    Comment: SPECIMEN CHECKED FOR CLOTS REPEATED TO VERIFY PLATELET COUNT CONFIRMED BY SMEAR   Troponin I     Status: Abnormal   Collection Time: 12/28/15 10:36 AM  Result Value Ref Range   Troponin I 0.05 (HH) <0.03 ng/mL    Comment: CRITICAL RESULT CALLED TO, READ BACK BY AND VERIFIED WITH: Jim Wyatt AT 1222 12/28/15 BY ZBEECH.     Ct Abdomen Pelvis Wo Contrast  Result Date: 12/28/2015 CLINICAL DATA:  Abdominal pain starting during dialysis today. Hypertension. EXAM: CT ABDOMEN AND PELVIS WITHOUT CONTRAST TECHNIQUE: Multidetector CT imaging of the abdomen and pelvis was performed following the standard protocol without IV contrast. COMPARISON:  Multiple exams, including the 11/07/2013 FINDINGS: Lower chest: Mild to moderate cardiomegaly. Hepatobiliary: Unremarkable Pancreas: Unremarkable Spleen: Unremarkable Adrenals/Urinary Tract: Unremarkable Stomach/Bowel: Suspected ileocolic intussusception, about 5.5 cm in length, with dilated right colon at about 6.3 cm diameter.  Possible bowel wall thickening and irregularity in this region. I do not see dilated proximal loops of small bowel. Appendix not visible. Vascular/Lymphatic: No significant atherosclerotic calcification. Indistinctness of tissue planes around the aorta and IVC. Inguinal lymph nodes up to about 1.3 cm in short axis. Reduced sensitivity for adenopathy. Reproductive: Unremarkable Other: Pelvic ascites. Ascites in the right paracolic gutter. Diffuse edema in the subcutaneous and mesenteric tissues which in conjunction with the lack of oral and IV contrast causes poor definition of structures intra-abdominally. 1.9 by 3.9 by 2.8 cm area of accentuated density in the upper midline subcutaneous tissues, probably  a small herniation of omentum. Intramedullary nail in the right femur only partially seen. Musculoskeletal: Unremarkable IMPRESSION: 1. Dilated and potentially inflamed loop of bowel in the right abdomen is thought to be colon, and there appears to be incorporation of adipose tissue into the center of this structure strongly favoring ileocolic intussusception. 2. Because of the severity of fluid infiltration in the mesentery and fatty tissues, as well as the lack of oral and IV contrast, this abnormality in the right abdomen is not entirely specific. I do favor intussusception. The fluid infiltration could be due to hypoproteinemia/hypoalbuminemia or right heart failure. 3. Suspected small ventral upper abdominal hernia containing omental adipose tissue. 4. Mild to moderate enlargement of the heart. 5. Ascites in the right paracolic gutter and a small amount of pelvic ascites. Electronically Signed   By: Van Clines M.D.   On: 12/28/2015 14:20   Dg Chest 2 View  Result Date: 12/28/2015 CLINICAL DATA:  35 year old male with generalized abdominal pain and shortness of breath after 2 hours of dialysis EXAM: CHEST  2 VIEW COMPARISON:  Prior chest x-ray 07/06/2015 FINDINGS: Stable cardiomegaly. Mediastinal  contours are within normal limits. Mild scratch then no significant vascular congestion, pulmonary edema, pleural effusion or pneumothorax. No evidence of free air under the diaphragm. No focal airspace consolidation. Osseous structures are intact and unremarkable. The previously noted right IJ approach tunneled hemodialysis catheter has been removed. IMPRESSION: 1. Stable cardiomegaly. 2. No evidence of edema or other acute cardiopulmonary process. Electronically Signed   By: Jacqulynn Cadet M.D.   On: 12/28/2015 13:10    Review of Systems  Constitutional: Negative for chills and fever.  Respiratory: Positive for shortness of breath.   Cardiovascular: Negative for chest pain and palpitations.  Gastrointestinal: Positive for abdominal pain, nausea and vomiting. Negative for constipation and diarrhea.  Genitourinary: Negative for dysuria and hematuria.  Neurological: Negative for headaches.   Blood pressure (!) 176/117, pulse 93, temperature 97.6 F (36.4 C), temperature source Oral, resp. rate (!) 9, height _0  (1.702 m), weight 67 kg (147 lb 11.3 oz), SpO2 97 %. Physical Exam General: Pleasant and cooperative AA male sitting up and leaning forward on hospital bed, appears stated age, mild abdominal distress. HEENT: head -normocephalic, atraumatic; mouth: pink mucosa, no exudates.  Neck- Trachea is midline, no thyromegaly or JVD appreciated; no carotid bruits CV- RRR, normal S1/S2, no M/R/G, radial and dorsalis pedis pulses 2+ BL Pulm- breathing is non-labored. CTABL, no wheezes, rhales, rhonchi. Abd- soft, TTP RLQ over a palpable mass with no overlying erythema; tender, reducible ventral hernia with no overlying erythema. appropriate bowel sounds in 4 quadrants, no organomegaly.  MSK- UE/LE symmetrical, no cyanosis, clubbing, or edema. Neuro- moves all extremities spontaneously, no paresthesias. Psych- Alert and Oriented x3 with appropriate affect  Assessment/Plan: Abdominal  pain Vomiting Palpable right lower quadrant mass  - suspect ileocolonic intussusception without signs of bowel perforation  - OR today with Dr. Kae Heller for exploratory laparotomy with possible bowel resection  Ventral hernia - fat containing, reducible   FEN: NPO ID: Ancef perioperatively VTE: SCD's  Dispo: Admit to medical service for multiple medical problems exploratory laparotomy today by Dr. Kae Heller for ileocolonic intussusception Follow lactate   Wyatt,Jim 12/28/2015, 2:32 PM

## 2015-12-28 NOTE — ED Notes (Signed)
Surgery at the bedside.

## 2015-12-28 NOTE — ED Notes (Signed)
Delay in lab draw,  Dr examining pt.

## 2015-12-28 NOTE — ED Triage Notes (Signed)
Per [facility], Pt is coming from Dialysis. Pt is a T/Th/Sat. Pt received two hours of treatment today. Pt reports his abdomen started hurting through two hours of his dialysis. Complains of generalized abdominal pain. Vitals per EMS: 200/130, 100% on RA (Pt requested Oxygen), 86 HR.

## 2015-12-28 NOTE — Op Note (Signed)
Operative Note  Jim Wyatt  [id number]  [id number]  12/28/2015   Surgeon: Victorino Sparrow ConnorMD  Assistant: Gurney Maxin MD  Procedure performed: Exploratory Laparotomy, ileocecectomy   Preop diagnosis: ileocolonic intussusception  Post-op diagnosis/intraop findings: ileocolic intussusception which reduced during dissection, infarcted appendix  Specimens: ileocecectomy  EBL: 78GN  Complications: none  Description of procedure: After obtaining informed consent the patient was taken to the operating room and placed supine on operating room table wheregeneral endotracheal anesthesia was initiated, preoperative antibiotics were administered, SCDs applied, and a formal timeout was performed. The abdomen was clipped, prepped and draped in the usual sterile fashion and a midline laparotomy was created. The cecum was easily visible and firm. The right colon and small bowel were mobilized lateral/inferior to medial/superior beginning at the white line of toldt using a combination of blunt and cautery dissection. The hepatic flexure was mobilized. During mobilization the intussusception reduced and the appendix was noted to be beefy red and firm, consistent with ischemic changes. No overt mass was palpable in the cecum or terminal ileum. The remainder of the small bowel was inspected without abnormality. Given concern for malignant lead point in this HIV+ adult, an ileocecetomy was then performed. A blue load linear cutting stapler was used to transect the terminal ileum about 10cm from the ileocecal valve. The right colon was transected in the same fashion just proximal to the right colic vessels. The interceding mesentery was divided with the Enseal and the ileocolic pedicle was reinforced with a figure of eight suture of 2-0 silk. A side to side functional end to end ileocolic anastamosis was then created with another fire of the blue load linear cutting stapler, the common enterotomy  was closed with a [id number]. A 3-0 silk lembert was placed at the apex of the staple line. The anastomosis was widely patent and well-perfused. Several 3-0 silk sutures were used to endorse hemostasis along the staple line. The mesenteric defect was closed with a running 3-0 silk. The abdomen was irrigated and hemostasis ensured. The bowel was then run once more to confirm no other abnormality. The incision was closed with a running looped PDS. The skin was loosely reapproximated with staples and wicks. A transparent dressing was then applied and the patient was awakened, extubated and taken to recovery in stable condition.   All counts were correct at the completion of the case

## 2015-12-28 NOTE — Anesthesia Preprocedure Evaluation (Signed)
Anesthesia Evaluation  Patient identified by MRN, date of birth, ID band Patient awake    Reviewed: Allergy & Precautions, NPO status , Patient's Chart, lab work & pertinent test results  Airway Mallampati: III  TM Distance: >3 FB Neck ROM: Full    Dental  (+) Teeth Intact, Dental Advisory Given   Pulmonary asthma , pneumonia, resolved,  Pt treated for CAP on 02/25/15 given fever and RUL infiltrate  Completely clear breath sounds  breath sounds clear to auscultation       Cardiovascular hypertension, negative cardio ROS   Rhythm:Regular Rate:Normal     Neuro/Psych  Headaches,    GI/Hepatic negative GI ROS, Neg liver ROS,   Endo/Other  negative endocrine ROS  Renal/GU CRF and 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     Musculoskeletal   Abdominal   Peds  Hematology  (+) anemia , HIV, Hgb is 10 today   Anesthesia Other Findings States he never had respiratory distress or wheezing or needed oxygen during CAP treatment 1 week ago   Reproductive/Obstetrics                             Anesthesia Physical  Anesthesia Plan  ASA: IV  Anesthesia Plan: General   Post-op Pain Management:    Induction: Intravenous, Rapid sequence and Cricoid pressure planned  Airway Management Planned: Oral ETT  Additional Equipment:   Intra-op Plan:   Post-operative Plan: Possible Post-op intubation/ventilation  Informed Consent: I have reviewed the patients History and Physical, chart, labs and discussed the procedure including the risks, benefits and alternatives for the proposed anesthesia with the patient or authorized representative who has indicated his/her understanding and acceptance.   Dental advisory given  Plan Discussed with: CRNA  Anesthesia Plan Comments:         Anesthesia Quick Evaluation

## 2015-12-28 NOTE — ED Notes (Signed)
Phlebotomy coming for Lactic Acid

## 2015-12-28 NOTE — H&P (Signed)
Date: 12/28/2015               Patient Name:  Jim Wyatt MRN: [id number]  DOB: Jul 17, 1980 Age / Sex: 35 y.o., male   PCP: [name], MD         Medical Service: Internal Medicine Teaching Service         Attending Physician: Dr. Joni Reining, DO     First Contact: Dr. Ledell Noss  Pager: [phone]  Second Contact: Dr. Maryellen Pile Pager: [phone]       After Hours (After 5p/  First Contact Pager: [phone]  weekends / holidays): Second Contact Pager: [phone]   Chief Complaint: abdominal pain   History of Present Illness: Jim Wyatt is a 35 y.o. male with a PMH of HIV, hypertension, seizures, HIV associated nephropathy and ESRD who presents with abdominal pain. This morning he was napping during hemodialysis when he developed a sudden abdominal pain. The pain was sharp and diffuse at first then localized to the middle of his abdomen and his lower right abdomen. The pain is severe and constant. He has never had any pain like this in the past. Since highschool he has had a knot in the center of his abdomen which is painfull at times. His last bowel movement was hours ago, he denies any recent diarrhea or constipation. He is nauseous and had two episodes of non bloody, non billy vomiting today.He has been experiencing chills which started 4 weeks ago, they come and go and are worse at night. He denies fever, congestion, runny nose, or cough.  In the ED he was found to be hypertensive and tachycardic. At home he is on hydralazine TID for hypertension, he last took this medication at 2 am. CT abdomen was suspicious for intussusception. He was given 1/2 L NS and general surgery was consulted.   Meds:   (Not in a hospital admission)   Allergies: Allergies as of 12/28/2015 - Review Complete 12/28/2015  Allergen Reaction Noted  . Bactrim [sulfamethoxazole-trimethoprim] Other (See Comments) 11/14/2013  . Vancomycin Other (See Comments) 12/04/2013  . Reglan  [metoclopramide] Other (See Comments) 11/21/2015  . Amlodipine Palpitations and Other (See Comments) 01/12/2015  . Augmentin [amoxicillin-pot clavulanate] Hives 09/03/2013   Past Medical History:  Diagnosis Date  . Asthma   . CAP (community acquired pneumonia) 10/2013  . Cellulitis 11/2013   LLE  . Chronic kidney disease   . DVT (deep venous thrombosis) ([place]) 10/2013   BLE  . Fever   . Headache   . HIV infection ([place]) dx'd 10/2013  . Hypertension   . Leg pain   . Multiple environmental allergies    "trees, dogs, cats"  . Multiple food allergies   . Shingles < 2010  . 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 He denies alcohol, smoking, or illicit drug use.   Review of Systems: A complete ROS was negative except as per HPI.   Physical Exam: Vitals:   12/28/15 1500 12/28/15 1515 12/28/15 1545 12/28/15 1600  BP: (!) 176/114 (!) 174/121 (!) 171/123 (!) 188/131  Pulse: 99 97 94 98  Resp: 14 12 11 24   Temp:      TempSrc:      SpO2: (!) 86% 92% 94% 93%  Weight:  Height:       Physical Exam  Constitutional: He is oriented to person, place, and time. He appears distressed.  HENT:  Head: Normocephalic and atraumatic.  Cardiovascular: Regular rhythm and intact distal pulses.  Tachycardia present.   No murmur heard. Pulmonary/Chest: Effort normal. He has no wheezes. He has no rales.  Abdominal: Soft. He exhibits mass. There is tenderness.  Umbilical hernia  Visible mass in right lower quadrant   Musculoskeletal: He exhibits no edema or deformity.  Neurological: He is alert and oriented to person, place, and time.  Skin: Skin is warm and dry.    Labs: CBC:  Recent Labs Lab 12/28/15 1011  WBC 5.0  HGB 12.6*  HCT 38.3*  MCV 89.7  PLT 97*    Basic Metabolic Panel:  Recent  Labs Lab 12/28/15 1011  NA 137  K 3.7  CL 95*  CO2 28  GLUCOSE 98  BUN 42*  CREATININE 11.08*  CALCIUM 8.4*   Cardiac Enzymes:  Recent Labs Lab 12/28/15 1036  TROPONINI 0.05*   Liver Function Tests:  Recent Labs Lab 12/28/15 1011  AST 56*  ALT 22  ALKPHOS 41  BILITOT 1.0  PROT 8.0  ALBUMIN 3.0*    Recent Labs Lab 12/28/15 1011  LIPASE 43   EKG: EKG: Normal sinus rhythm, prolonged QTc   Imaging: Ct Abdomen Pelvis Wo Contrast  Result Date: 12/28/2015 CLINICAL DATA:  Abdominal pain starting during dialysis today. Hypertension. EXAM: CT ABDOMEN AND PELVIS WITHOUT CONTRAST TECHNIQUE: Multidetector CT imaging of the abdomen and pelvis was performed following the standard protocol without IV contrast. COMPARISON:  Multiple exams, including the 11/07/2013 FINDINGS: Lower chest: Mild to moderate cardiomegaly. Hepatobiliary: Unremarkable Pancreas: Unremarkable Spleen: Unremarkable Adrenals/Urinary Tract: Unremarkable Stomach/Bowel: Suspected ileocolic intussusception, about 5.5 cm in length, with dilated right colon at about 6.3 cm diameter. Possible bowel wall thickening and irregularity in this region. I do not see dilated proximal loops of small bowel. Appendix not visible. Vascular/Lymphatic: No significant atherosclerotic calcification. Indistinctness of tissue planes around the aorta and IVC. Inguinal lymph nodes up to about 1.3 cm in short axis. Reduced sensitivity for adenopathy. Reproductive: Unremarkable Other: Pelvic ascites. Ascites in the right paracolic gutter. Diffuse edema in the subcutaneous and mesenteric tissues which in conjunction with the lack of oral and IV contrast causes poor definition of structures intra-abdominally. 1.9 by 3.9 by 2.8 cm area of accentuated density in the upper midline subcutaneous tissues, probably a small herniation of omentum. Intramedullary nail in the right femur only partially seen. Musculoskeletal: Unremarkable IMPRESSION: 1.  Dilated and potentially inflamed loop of bowel in the right abdomen is thought to be colon, and there appears to be incorporation of adipose tissue into the center of this structure strongly favoring ileocolic intussusception. 2. Because of the severity of fluid infiltration in the mesentery and fatty tissues, as well as the lack of oral and IV contrast, this abnormality in the right abdomen is not entirely specific. I do favor intussusception. The fluid infiltration could be due to hypoproteinemia/hypoalbuminemia or right heart failure. 3. Suspected small ventral upper abdominal hernia containing omental adipose tissue. 4. Mild to moderate enlargement of the heart. 5. Ascites in the right paracolic gutter and a small amount of pelvic ascites. Electronically Signed   By: Van Clines M.D.   On: 12/28/2015 14:20   Dg Chest 2 View  Result Date: 12/28/2015 CLINICAL DATA:  35 year old male with generalized abdominal pain and shortness of breath after 2 hours of dialysis EXAM:  CHEST  2 VIEW COMPARISON:  Prior chest x-ray 07/06/2015 FINDINGS: Stable cardiomegaly. Mediastinal contours are within normal limits. Mild scratch then no significant vascular congestion, pulmonary edema, pleural effusion or pneumothorax. No evidence of free air under the diaphragm. No focal airspace consolidation. Osseous structures are intact and unremarkable. The previously noted right IJ approach tunneled hemodialysis catheter has been removed. IMPRESSION: 1. Stable cardiomegaly. 2. No evidence of edema or other acute cardiopulmonary process. Electronically Signed   By: Jacqulynn Cadet M.D.   On: 12/28/2015 13:10   Assessment & Plan by Problem:   Active Problems:   Acquired immune deficiency syndrome (HCC)   Human immunodeficiency virus (HIV) disease ([place])   ESRD on dialysis ([place])   Intussusception ([place])   Hypertension  Abdominal pain  Jim Wyatt is a 35 y.o. male with PMH HIV, hypertension, seizures, HIV  associated nephropathy and ESRD who presents with abdominal pain. The pain started suddenly this morning during hemodialysis. CT scan in the ED was suspicious for intussusception. He has had chills for the past month but denies any changes in bowel habits or melena. He denies any symptoms of viral infection. Surgery has seen him and plans for exploratory laparotomy this evening. The pathology from this surgery may give more insight into the cause of his intussusception. His last bowel movement was earlier today but he has had some nausea and two episodes of non bilious vomiting. We will monitor for signs of obstruction.  - general surgery is managing   AIDS Last CD4 count 11/22/2015 was 30, HIV viral load 11/05/2015 125,000. He is seen at [facility] and has had some problems with insurance coverage and copay for medications. He just got his HIV medications two days ago but hasn't gotten around to starting them. We have continued his home medications abacavir 600 mg daily, darunavir-cobicistat 800-150 mg daily, dolutegravir 50 mg daily, and dapsone 100 mg daily for prophylaxis.   ESRD, focal segmental glomerular sclerosis  Creatinine 11.08 on admission, baseline is around 10. He is on Tuesday, Thursday, Saturday dialysis. He received dialysis today but this was stopped when he developed abdominal pain. We will need to consult nephrology in the morning.   Seizures  We have continued his home medication keppra 250 mg at Tuesday, Thursday , Saturday dialysis.   Hypertension  We have continued his home medication hydalazine 100 mg TID, carvedilol 12.5 mg BID, and  losartan 100 mg at bedtime.   Asthma  We have continued his home medications include albuterol inhaler q 6 hours PRN.   F NS 10 ml/hr  E none  N NPO for surgery  DVT Ppx SCDs  Code Status FULL   Dispo: Admit patient to Inpatient with expected length of stay greater than 2 midnights.  Signed: Ledell Noss, MD 12/28/2015, 4:56 PM  Pager:  [phone]

## 2015-12-28 NOTE — ED Notes (Signed)
Taken to Xray.

## 2015-12-28 NOTE — Progress Notes (Signed)
Pt tachycardic 140s since arrival from PACU.   12 lead EKG obtained confirming ST rate 140.  Pt  C/O 10/10 abd pain not relieved by Dilaudid 1 mg IV given on arrival to  Unit.  BP 110/76.   Dr Heber [place] notified of same.  No new orders received.

## 2015-12-28 NOTE — ED Notes (Signed)
Pt taken to CT.

## 2015-12-28 NOTE — ED Notes (Signed)
Admitting MD just left from examining the patient

## 2015-12-29 ENCOUNTER — Encounter (HOSPITAL_COMMUNITY): Payer: Self-pay | Admitting: Surgery

## 2015-12-29 DIAGNOSIS — Z992 Dependence on renal dialysis: Secondary | ICD-10-CM

## 2015-12-29 DIAGNOSIS — Z79899 Other long term (current) drug therapy: Secondary | ICD-10-CM

## 2015-12-29 DIAGNOSIS — N186 End stage renal disease: Secondary | ICD-10-CM

## 2015-12-29 DIAGNOSIS — E785 Hyperlipidemia, unspecified: Secondary | ICD-10-CM

## 2015-12-29 DIAGNOSIS — B20 Human immunodeficiency virus [HIV] disease: Secondary | ICD-10-CM

## 2015-12-29 DIAGNOSIS — R571 Hypovolemic shock: Secondary | ICD-10-CM

## 2015-12-29 DIAGNOSIS — I120 Hypertensive chronic kidney disease with stage 5 chronic kidney disease or end stage renal disease: Secondary | ICD-10-CM

## 2015-12-29 DIAGNOSIS — G40909 Epilepsy, unspecified, not intractable, without status epilepticus: Secondary | ICD-10-CM

## 2015-12-29 DIAGNOSIS — Z9049 Acquired absence of other specified parts of digestive tract: Secondary | ICD-10-CM

## 2015-12-29 DIAGNOSIS — D62 Acute posthemorrhagic anemia: Secondary | ICD-10-CM

## 2015-12-29 LAB — BASIC METABOLIC PANEL
Anion gap: 14 (ref 5–15)
BUN: 62 mg/dL — ABNORMAL HIGH (ref 6–20)
CO2: 24 mmol/L (ref 22–32)
Calcium: 8.2 mg/dL — ABNORMAL LOW (ref 8.9–10.3)
Chloride: 100 mmol/L — ABNORMAL LOW (ref 101–111)
Creatinine, Ser: 13.39 mg/dL — ABNORMAL HIGH (ref 0.61–1.24)
GFR calc Af Amer: 5 mL/min — ABNORMAL LOW (ref >60)
GFR calc non Af Amer: 4 mL/min — ABNORMAL LOW (ref >60)
Glucose, Bld: 103 mg/dL — ABNORMAL HIGH (ref 65–99)
Potassium: 6 mmol/L — ABNORMAL HIGH (ref 3.5–5.1)
Sodium: 138 mmol/L (ref 135–145)

## 2015-12-29 LAB — CBC
HCT: 33.4 % — ABNORMAL LOW (ref 39.0–52.0)
HCT: 30.7 % — ABNORMAL LOW (ref 39.0–52.0)
HCT: 26.1 % — ABNORMAL LOW (ref 39.0–52.0)
HCT: 22.5 % — ABNORMAL LOW (ref 39.0–52.0)
HCT: 26.2 % — ABNORMAL LOW (ref 39.0–52.0)
Hemoglobin: 10.7 g/dL — ABNORMAL LOW (ref 13.0–17.0)
Hemoglobin: 9.7 g/dL — ABNORMAL LOW (ref 13.0–17.0)
Hemoglobin: 8.2 g/dL — ABNORMAL LOW (ref 13.0–17.0)
Hemoglobin: 7.2 g/dL — ABNORMAL LOW (ref 13.0–17.0)
Hemoglobin: 8.3 g/dL — ABNORMAL LOW (ref 13.0–17.0)
MCH: 29.2 pg (ref 26.0–34.0)
MCH: 28.8 pg (ref 26.0–34.0)
MCH: 28.8 pg (ref 26.0–34.0)
MCH: 29.3 pg (ref 26.0–34.0)
MCH: 28.4 pg (ref 26.0–34.0)
MCHC: 32 g/dL (ref 30.0–36.0)
MCHC: 31.6 g/dL (ref 30.0–36.0)
MCHC: 31.4 g/dL (ref 30.0–36.0)
MCHC: 32 g/dL (ref 30.0–36.0)
MCHC: 31.7 g/dL (ref 30.0–36.0)
MCV: 91 fL (ref 78.0–100.0)
MCV: 91.1 fL (ref 78.0–100.0)
MCV: 91.6 fL (ref 78.0–100.0)
MCV: 91.5 fL (ref 78.0–100.0)
MCV: 89.7 fL (ref 78.0–100.0)
Platelets: 137 10*3/uL — ABNORMAL LOW (ref 150–400)
Platelets: 111 10*3/uL — ABNORMAL LOW (ref 150–400)
Platelets: 108 10*3/uL — ABNORMAL LOW (ref 150–400)
Platelets: 98 10*3/uL — ABNORMAL LOW (ref 150–400)
Platelets: 98 10*3/uL — ABNORMAL LOW (ref 150–400)
RBC: 3.67 MIL/uL — ABNORMAL LOW (ref 4.22–5.81)
RBC: 3.37 MIL/uL — ABNORMAL LOW (ref 4.22–5.81)
RBC: 2.85 MIL/uL — ABNORMAL LOW (ref 4.22–5.81)
RBC: 2.46 MIL/uL — ABNORMAL LOW (ref 4.22–5.81)
RBC: 2.92 MIL/uL — ABNORMAL LOW (ref 4.22–5.81)
RDW: 15.6 % — ABNORMAL HIGH (ref 11.5–15.5)
RDW: 16 % — ABNORMAL HIGH (ref 11.5–15.5)
RDW: 16.2 % — ABNORMAL HIGH (ref 11.5–15.5)
RDW: 16.3 % — ABNORMAL HIGH (ref 11.5–15.5)
RDW: 16.9 % — ABNORMAL HIGH (ref 11.5–15.5)
WBC: 7.2 10*3/uL (ref 4.0–10.5)
WBC: 7.8 10*3/uL (ref 4.0–10.5)
WBC: 6.6 10*3/uL (ref 4.0–10.5)
WBC: 6 10*3/uL (ref 4.0–10.5)
WBC: 6.3 10*3/uL (ref 4.0–10.5)

## 2015-12-29 LAB — COMPREHENSIVE METABOLIC PANEL
ALT: 37 U/L (ref 17–63)
AST: 128 U/L — ABNORMAL HIGH (ref 15–41)
Albumin: 2.3 g/dL — ABNORMAL LOW (ref 3.5–5.0)
Alkaline Phosphatase: 38 U/L (ref 38–126)
Anion gap: 15 (ref 5–15)
BUN: 52 mg/dL — ABNORMAL HIGH (ref 6–20)
CO2: 23 mmol/L (ref 22–32)
Calcium: 8.3 mg/dL — ABNORMAL LOW (ref 8.9–10.3)
Chloride: 98 mmol/L — ABNORMAL LOW (ref 101–111)
Creatinine, Ser: 12.76 mg/dL — ABNORMAL HIGH (ref 0.61–1.24)
GFR calc Af Amer: 5 mL/min — ABNORMAL LOW (ref >60)
GFR calc non Af Amer: 5 mL/min — ABNORMAL LOW (ref >60)
Glucose, Bld: 122 mg/dL — ABNORMAL HIGH (ref 65–99)
Potassium: 5.9 mmol/L — ABNORMAL HIGH (ref 3.5–5.1)
Sodium: 136 mmol/L (ref 135–145)
Total Bilirubin: 0.8 mg/dL (ref 0.3–1.2)
Total Protein: 6.5 g/dL (ref 6.5–8.1)

## 2015-12-29 LAB — PREPARE RBC (CROSSMATCH)

## 2015-12-29 LAB — MRSA PCR SCREENING: MRSA by PCR: POSITIVE — AB

## 2015-12-29 LAB — PROTIME-INR
INR: 1.19
Prothrombin Time: 15.2 seconds (ref 11.4–15.2)

## 2015-12-29 LAB — GLUCOSE, CAPILLARY: Glucose-Capillary: 109 mg/dL — ABNORMAL HIGH (ref 65–99)

## 2015-12-29 MED ORDER — SODIUM CHLORIDE 0.9 % IV SOLN
Freq: Once | INTRAVENOUS | Status: AC
Start: 2015-12-29 — End: 2015-12-29
  Administered 2015-12-29: 10 mL/h via INTRAVENOUS

## 2015-12-29 MED ORDER — SODIUM CHLORIDE 0.9 % IV BOLUS (SEPSIS)
500.0000 mL | Freq: Once | INTRAVENOUS | Status: AC
  Administered 2015-12-29: 500 mL via INTRAVENOUS

## 2015-12-29 MED ORDER — CHLORHEXIDINE GLUCONATE CLOTH 2 % EX PADS
6.0000 | MEDICATED_PAD | Freq: Every day | CUTANEOUS | Status: AC
  Administered 2015-12-29 – 2016-01-02 (×3): 6 via TOPICAL

## 2015-12-29 MED ORDER — MUPIROCIN 2 % EX OINT
1.0000 "application " | TOPICAL_OINTMENT | Freq: Two times a day (BID) | CUTANEOUS | Status: AC
  Administered 2015-12-29 – 2016-01-02 (×10): 1 via NASAL
  Filled 2015-12-29 (×4): qty 22

## 2015-12-29 MED ORDER — WHITE PETROLATUM GEL
Status: AC
  Filled 2015-12-29: qty 1

## 2015-12-29 NOTE — Progress Notes (Addendum)
Paged PA Earnstine Regal to inform him of pt's increased pain levels and increased shortness of breath. PA was aware of this information during the prior dressing change at 1700.  Pt states the "pain makes it hard to breath". No return call from PA, so RN will pass this information along to the PM nurse to follow up.

## 2015-12-29 NOTE — Progress Notes (Signed)
Subjective: Mr. Voland is feeling uncomfortable this morning, tachycardic to 120s and hypotensive in 80s/60s, s/p ileocectomy last night. Continues to have slowly oozing bleeding from abdominal midline bandage with moderate diffuse abdominal tenderness to palpation, not distended or peritonitic. He is concerned about having too many doctors in the room at once. Reports mild shortness of breath due to abdominal pain with breathing.    Objective: Vital signs in last 24 hours: Vitals:   12/29/15 1600 12/29/15 1613 12/29/15 1700 12/29/15 1715  BP: 100/68 100/68 109/73 121/73  Pulse: (!) 108 (!) 108 (!) 106 (!) 107  Resp: (!) 21 14 14 19   Temp:  98.3 F (36.8 C) 98.3 F (36.8 C) 97.9 F (36.6 C)  TempSrc:  Oral Oral Oral  SpO2: 97% 95% 95% 95%  Weight:      Height:        Intake/Output Summary (Last 24 hours) at 12/29/15 1733 Last data filed at 12/29/15 0800  Gross per 24 hour  Intake          1136.63 ml  Output              225 ml  Net           911.63 ml    Physical Exam General appearance: Thin, tired-appearing gentleman resting in bed, alert and conversational HENT: Normocephalic, atraumatic Cardiovascular: Tachycardic rate but normal rhythm, no murmurs, rubs, gallops Respiratory/Chest: Clear to ausculation bilaterally, mildly tachypneic Abdomen: Midline abdominal incision present without sign of infection, small amounts of blood oozing around lower aspect, abdomen soft, moderately tender to palpation in all quadrants, worst in RLQ and periumbilical over incision, non-distended Skin: Warm, dry, intact Neuro: Cranial nerves grossly intact, alert and oriented Psych: Appropriate affect  Labs / Imaging / Procedures: CBC Latest Ref Rng & Units 12/29/2015 12/29/2015 12/29/2015  WBC 4.0 - 10.5 K/uL 6.0 6.6 7.8  Hemoglobin 13.0 - 17.0 g/dL 7.2(L) 8.2(L) 9.7(L)  Hematocrit 39.0 - 52.0 % 22.5(L) 26.1(L) 30.7(L)  Platelets 150 - 400 K/uL 98(L) 108(L) 111(L)   BMP Latest Ref Rng &  Units 12/29/2015 12/29/2015 12/28/2015  Glucose 65 - 99 mg/dL 103(H) 122(H) 98  BUN 6 - 20 mg/dL 62(H) 52(H) 42(H)  Creatinine 0.61 - 1.24 mg/dL 13.39(H) 12.76(H) 11.08(H)  Sodium 135 - 145 mmol/L 138 136 137  Potassium 3.5 - 5.1 mmol/L 6.0(H) 5.9(H) 3.7  Chloride 101 - 111 mmol/L 100(L) 98(L) 95(L)  CO2 22 - 32 mmol/L 24 23 28   Calcium 8.9 - 10.3 mg/dL 8.2(L) 8.3(L) 8.4(L)   No results found.  Assessment/Plan: Jim Wyatt is a 35 y.o. gentleman with PMH AIDS, ESRD on HD TTS, ACD  admitted for acute abdominal, found to have ileocecal intussusception and taken to OR for resection.   Ileocecal intussusception, due to infarcted appendix, s/p resection and anastomosis on 10/31, now POD#1 with hypovolemic shock and worsening anemia. BP this AM in 80s/60s, HR 120s, s/p 500cc bolus over the course of the day, now bp 120/73 with HR 107. Hb now 7.2 from 10.7 at midnight, ongoing oozing bleeding (not arterial) which could not be resolved with surgical intervention. - Surgery following and low threshold for return to OR if apparent hemorrhage or worsening bleeding through dressing - Remain NPO - Dilaudid PCA for pain control - Frequent vitals, dressing assessment, can consider light fluid resuscitation if hypotension recurrs - Transfuse 1 u pRBCs, assess post transfusion HH - Follow AM CBC  Acute blood loss anemia, Hb 7.2 at 3pm from  10.7 at previous midnight, ongoing oozing at incision site appears venous, likely secondary to uremia in setting of ESRD, needs HD tomorrow - 1u pRBCs this evening, follow post trans HH - AM CBC - Serial abdominal exams and bleeding assessment  AIDS, last CD 30 in September, elevated viral load, previous difficulty with insurance.  - Restarted his ART - Dapsone for PCP ppx - Started Azithro 1200mg  weekly for MAC ppx  ESRD on HD, nephrology on board, worsening uremia, hyperkalemia (now 6.0) - Follow AM RFP - Nephrology planning for HD tomorrow - Fluid boluses  are keeping hyperkalemia at manageable level  Hypertension - Holding meds in setting of hypotension  Seizure disorder - Continue home keppra.   Dispo: Anticipated discharge in approximately 3-5 day(s).   LOS: 1 day   Asencion Partridge, MD 12/29/2015, 5:33 PM Pager: [phone]

## 2015-12-29 NOTE — Evaluation (Signed)
Physical Therapy Evaluation Patient Details Name: Jim Wyatt MRN: [id number] DOB: 03/17/1980 Today's Date: 12/29/2015   History of Present Illness  Pt is a 35 y/o male s/p ex lap with bowel resection secondary to intussusception. PMH including but not limited to HIV/AIDS, ESRD and HTN.  Clinical Impression  Pt presented supine in bed with HOB elevated, awake and willing to participate in therapy session. Prior to admission, pt reported being independent with all functional mobility. Pt with limited participation in therapy evaluation secondary to significant pain (8/10) with sitting and with movement. Pt would continue to benefit from skilled physical therapy services at this time while admitted and after d/c to address his below listed limitations in order to improve his overall safety and independence with functional mobility.     Follow Up Recommendations Home health PT;Supervision/Assistance - 24 hour    Equipment Recommendations  None recommended by PT    Recommendations for Other Services       Precautions / Restrictions Precautions Precautions: Fall Restrictions Weight Bearing Restrictions: No      Mobility  Bed Mobility Overal bed mobility: Needs Assistance Bed Mobility: Rolling;Sidelying to Sit;Sit to Sidelying Rolling: Min assist Sidelying to sit: Min assist     Sit to sidelying: Max assist General bed mobility comments: PT instructed pt in log roll technique to achieve sitting EOB. Pt required max A with bilateral LEs for return to supine in bed.  Transfers Overall transfer level: Needs assistance Equipment used: 1 person hand held assist Transfers: Sit to/from Stand Sit to Stand: Min assist         General transfer comment: pt required increased time, use of bed rails and 1 HHA to achieve full standing  Ambulation/Gait             General Gait Details: deferred secondary to pain and fatigue  Stairs            Wheelchair Mobility     Modified Rankin (Stroke Patients Only)       Balance Overall balance assessment: Needs assistance Sitting-balance support: Feet supported;Bilateral upper extremity supported Sitting balance-Leahy Scale: Poor Sitting balance - Comments: pt able to sit EOB for ~15 mins with min guard for safety and bilateral UEs on bed. Postural control: Other (comment) (flexed trunk secondary to abdominal pain) Standing balance support: During functional activity;Single extremity supported Standing balance-Leahy Scale: Poor Standing balance comment: pt standing for approximately 5 seconds with 1 HHA prior to returning to sit EOB secondary to pain and fatigue                             Pertinent Vitals/Pain Pain Assessment: 0-10 Pain Score: 8  Pain Location: abdomen, incision site Pain Descriptors / Indicators: Grimacing;Guarding;Moaning Pain Intervention(s): Monitored during session;Repositioned  BP stable throughout session, in sitting = 90/66, in supine = 87/67    Home Living Family/patient expects to be discharged to:: Private residence Living Arrangements: Non-relatives/Friends Available Help at Discharge: Friend(s);Available PRN/intermittently   Home Access: Level entry     Home Layout: One level Home Equipment: Walker - 2 wheels      Prior Function Level of Independence: Independent               Hand Dominance        Extremity/Trunk Assessment   Upper Extremity Assessment: Overall WFL for tasks assessed           Lower Extremity Assessment: Overall WFL for  tasks assessed         Communication   Communication: No difficulties  Cognition Arousal/Alertness: Awake/alert Behavior During Therapy: WFL for tasks assessed/performed Overall Cognitive Status: Within Functional Limits for tasks assessed                      General Comments      Exercises     Assessment/Plan    PT Assessment Patient needs continued PT services  PT  Problem List Decreased strength;Decreased range of motion;Decreased activity tolerance;Decreased balance;Decreased mobility;Decreased coordination;Decreased knowledge of use of DME;Pain          PT Treatment Interventions DME instruction;Gait training;Functional mobility training;Therapeutic activities;Therapeutic exercise;Balance training;Neuromuscular re-education;Patient/family education    PT Goals (Current goals can be found in the Care Plan section)  Acute Rehab PT Goals Patient Stated Goal: decrease pain PT Goal Formulation: With patient Time For Goal Achievement: 01/12/16 Potential to Achieve Goals: Good    Frequency Min 3X/week   Barriers to discharge Decreased caregiver support      Co-evaluation               End of Session Equipment Utilized During Treatment: Oxygen;Other (comment) (pt with 4 L O2 via [place]; however, pt removing [place] throughout) Activity Tolerance: Patient limited by pain;Patient limited by fatigue Patient left: in bed;with SCD's reapplied;with nursing/sitter in room (pt's nurse in room administering meds) Nurse Communication: Mobility status         Time: 1000-1032 PT Time Calculation (min) (ACUTE ONLY): 32 min   Charges:     PT Treatments $Therapeutic Activity: 23-37 mins   PT G CodesClearnce Sorrel Tyana Butzer 12/29/2015, 11:39 AM Sherie Don, PT, DPT [phone]

## 2015-12-29 NOTE — Progress Notes (Signed)
S: Complains of incisional pain, relieved somewhat with PCA. No nausea. Some bloating.   Vitals, labs, intake/output, and orders reviewed at this time. -Was initially hypertensive post-op but SBP has been 80-90s (MAPs in 70s) since about 11pm with associated tachycardia. CBC at midnight w/ hgb 10.7 (12.6 yesterday but baseline was 10.7 3 weeks ago; platelets were 97 yesterday to 137 at midnight). No improvement with 500cc bolus.  Gen: A&Ox3, mentating appropriately H&N: EOMI, atraumatic, neck supple Chest: unlabored respirations, tachycardic and regular. Bounding dorsalis pedis pulse bilaterally. Palpable thrill in left arm AVF. Abd: soft, tender at incision and right hemiabdomen, less so on left. No guarding. Mildly distended.  [place] saturated with blood at inferior aspect.  Ext: warm, no edema Neuro: grossly normal  Lines/tubes/drains: PIV  A/P:  POD 1 s/p exploratory laparotomy, ileocecectomy for ileocolic intussusception. Hypotension- steadily low BP for last several hours. AM CBC pending. Bounding distal pulses. Continue supportive care. If worsening or significant change in hgb this morning may require re-exploration NPO, await return of bowel function Foley can be removed    Romana Juniper, MD [facility], [place] Pager [phone]

## 2015-12-29 NOTE — Consult Note (Addendum)
Renal Service Consult Note [place] 12/29/2015 Sol Blazing Requesting Physician:  Dr Heber Wedgefield  Reason for Consult:  ESRD pt with intussusception HPI: The patient is a 35 y.o. year-old with hx of HTN, HIV, DVT and ESRD on HD started Dec 2016.  Presented yesterday from HD with abd pain, onset  2hrs into HD.  Was found to have ileocolonic intussusception and was taken to OR.  During mobilization of the bowel, the intussusception reduced itself.  The appendix looked ischemic.  An ileocolectomy was performed.  Patient is stable postop today. Asked to see for HD.    Patient started HD in Dec 2016.  Hx of HIV.  No problems with HD recently.     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 MRI changes could be HIV enceph vs metabolic/ toxic enceph. CMV was neg.  Low CD4/ AIDS. TTS HD. Got better.  HTN improved.  Sept '17 -  R otitis externa and media, no bone destruction on CT, seen by ENT, low growth of both MRSA and staph lugdenensis. To get vanc with OP HD at dc.      ROS  denies CP  no joint pain   no HA  no blurry vision  no rash  no diarrhea  no nausea/ vomiting  no dysuria  no difficulty voiding  no change in urine color    Past Medical History  Past Medical History:  Diagnosis Date  . Asthma   . CAP (community acquired pneumonia) 10/2013  . Cellulitis 11/2013   LLE  . Chronic kidney disease   . DVT (deep venous thrombosis) ([place]) 10/2013   BLE  . Fever   . Headache   . HIV infection ([place]) dx'd 10/2013  . Hypertension   . Leg pain    . Multiple environmental allergies    "trees, dogs, cats"  . Multiple food allergies   . Shingles < 2010  . 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 AV FISTULA PLACEMENT Left 03/03/2015   Procedure: Creation of Left Arm RADIOCEPHALIC ARTERIOVENOUS FISTULA ;  Surgeon: Mal Misty, MD;  Location: [facility];  Service: Vascular;  Laterality: Left;  . FEMUR FRACTURE SURGERY Right 09/2001  . FRACTURE SURGERY    . I&D EXTREMITY Right 12/18/2014   Procedure: IRRIGATION AND DEBRIDEMENT RIGHT LOWER EXTREMITY;  Surgeon: Ralene Ok, MD;  Location: [facility];  Service: General;  Laterality: Right;  . INSERTION OF DIALYSIS CATHETER  2016  . LAPAROTOMY N/A 12/28/2015   Procedure: EXPLORATORY LAPAROTOMY BOWEL RESECTION ILEOCECECTOMY;  Surgeon: Clovis Riley, MD;  Location: [facility];  Service: General;  Laterality: N/A;   Family History  Family History  Problem Relation Age of Onset  . Hypertension Mother   . Diabetes Maternal Grandmother   . Kidney disease Maternal Grandmother    Social History  reports that he has never smoked. He has never used smokeless tobacco. He  reports that he does not drink alcohol or use drugs. Allergies  Allergies  Allergen Reactions  . Bactrim [Sulfamethoxazole-Trimethoprim] Other (See Comments)    Renal failure  . Vancomycin Other (See Comments)    Patient states vancomycin caused kidney injury  . Reglan [Metoclopramide] Other (See Comments)    Causes ticks/ jerks  . Amlodipine Palpitations and Other (See Comments)    fatigue  . Augmentin [Amoxicillin-Pot Clavulanate] Hives   Home medications Prior to Admission medications   Medication Sig Start Date End Date Taking? Authorizing Provider  abacavir (ZIAGEN) 300 MG tablet Take 2 tablets (600 mg total) by mouth daily with supper. 12/14/15  Yes Carlyle Basques, MD  acetaminophen (TYLENOL) 500 MG tablet Take 1,000 mg by mouth every 6 (six)  hours as needed (pain).   Yes Historical Provider, MD  albuterol (PROVENTIL HFA;VENTOLIN HFA) 108 (90 Base) MCG/ACT inhaler Inhale 2 puffs into the lungs every 6 (six) hours as needed for wheezing or shortness of breath. 07/10/15  Yes Jule Ser, DO  CARTIA XT 180 MG 24 hr capsule Take 180 mg by mouth daily. 12/11/15  Yes Historical Provider, MD  carvedilol (COREG) 25 MG tablet Take 25 mg by mouth 2 (two) times daily. 12/02/15  Yes Historical Provider, MD  dapsone 100 MG tablet Take 1 tablet (100 mg total) by mouth daily. 11/24/15  Yes Alexa Angela Burke, MD  darunavir-cobicistat (PREZCOBIX) 800-150 MG tablet Take 1 tablet by mouth every evening. Swallow whole. Do NOT crush, break or chew tablets. Take with food. 12/14/15  Yes Carlyle Basques, MD  diphenhydrAMINE (BENADRYL) 25 MG tablet Take 25 mg by mouth every 4 (four) hours as needed for allergies.    Yes Historical Provider, MD  dolutegravir (TIVICAY) 50 MG tablet Take 1 tablet (50 mg total) by mouth every evening. 12/14/15  Yes Carlyle Basques, MD  hydrALAZINE (APRESOLINE) 100 MG tablet Take 100 mg by mouth 3 (three) times daily. 6:30am, 2:30pm, 11pm - for high blood pressure   Yes Historical Provider, MD  levETIRAcetam (KEPPRA) 250 MG tablet Take 1 tablet (250 mg total) by mouth See admin instructions. Take 1 tablet after your dialysis treatments. Patient taking differently: Take 250 mg by mouth See admin instructions. Take 1 tablet (250 mg) by mouth on Tuesday, Thursday, Saturday at 7pm (dialysis days) 11/05/15  Yes Nona Dell, PA-C  levETIRAcetam (KEPPRA) 500 MG tablet Take 500 mg by mouth See admin instructions. Take 1 tablet (500 mg) by mouth Sunday, Monday, Wednesday, Friday at 7pm (non-dialysis days)   Yes Historical Provider, MD  losartan (COZAAR) 100 MG tablet Take 100 mg by mouth at bedtime.   Yes Historical Provider, MD  ondansetron (ZOFRAN ODT) 8 MG disintegrating tablet Take 1 tablet (8 mg total) by mouth every 8 (eight) hours as  needed for nausea or vomiting. 10/06/15  Yes Carlyle Basques, MD  sucroferric oxyhydroxide (VELPHORO) 500 MG chewable tablet Chew 500 mg by mouth See admin instructions. Chew 1 tablet (500 mg) by mouth after every meal (2-3 times daily)   Yes Historical Provider, MD  calcium carbonate (TUMS - DOSED IN MG ELEMENTAL CALCIUM) 500 MG chewable tablet Chew 1 tablet (200 mg of elemental calcium total) by mouth 3 (three) times daily with meals. Patient not taking: Reported on 12/28/2015 07/10/15   Jule Ser, DO   Liver Function Tests  Recent Labs Lab 12/28/15 1011 12/29/15 0532  AST 56* 128*  ALT 22 37  ALKPHOS 41 38  BILITOT 1.0 0.8  PROT 8.0 6.5  ALBUMIN 3.0* 2.3*    Recent Labs Lab 12/28/15 1011  LIPASE 43   CBC  Recent Labs Lab 12/29/15 0048 12/29/15 0532 12/29/15 1217  WBC 7.2 7.8 6.6  HGB 10.7* 9.7* 8.2*  HCT 33.4* 30.7* 26.1*  MCV 91.0 91.1 91.6  PLT 137* 111* 811*   Basic Metabolic Panel  Recent Labs Lab 12/28/15 1011 12/29/15 0532  NA 137 136  K 3.7 5.9*  CL 95* 98*  CO2 28 23  GLUCOSE 98 122*  BUN 42* 52*  CREATININE 11.08* 12.76*  CALCIUM 8.4* 8.3*   Iron/TIBC/Ferritin/ %Sat    Component Value Date/Time   IRON 59 01/12/2015 1617   TIBC 248 (L) 01/12/2015 1617   FERRITIN 484 (H) 01/12/2015 1617   IRONPCTSAT 24 01/12/2015 1617    Vitals:   12/29/15 0838 12/29/15 0900 12/29/15 1146 12/29/15 1158  BP: (!) 86/62  (!) 88/52   Pulse:  (!) 122 (!) 113   Resp:   15 15  Temp: 98.2 F (36.8 C)  98.9 F (37.2 C)   TempSrc: Oral  Oral   SpO2:   90% 93%  Weight:      Height:       Exam Gen thin AAF, lying flat, nasal O2, no distress No rash, cyanosis or gangrene Sclera anicteric, throat clear  No jvd or bruits Chest clear bilat RRR no MRG Abd large dressing mid-abdomen, blood-soaked, vol guarding, not palpated pt request GU normal male MS no joint effusions or deformity Ext no LE edema / no wounds or ulcers Neuro is alert, Ox 3 , nf L arm  AVF+bruit   [facility]   3h 8min   66.5kg   2/2 bath  P4   LFA AVF    Hep 5000 mircera 100 q 2, last 10/24 Calcitriol 0.75 tiw, last pth 975, P 7.6   Assessment: 1. SP ileocecectomy for intussusception - stable post op 2. 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 Vol is up 2-3kg  4. HIV/ AIDS - last CD4 in Sept remained low at 30 5. Recent otitis externa/ media - cx's grew MRSA and staph lugdunensis, rx'd w vanc 6. Hypotension - prob from postp/ anesthesia, 3rd spacing 7. Hyperkalemia    Plan - HD tomorrow, no hep, low K bath  Kelly Splinter MD [employer] pager [phone]   12/29/2015, 1:03 PM

## 2015-12-29 NOTE — Progress Notes (Signed)
1 Day Post-Op  Subjective: Dr Kae Heller looked at him earlier and was concerned with tachycardia and hypotension.  Medicine has treated with Fluids, EKG shows sinus tachycardia.  We saw him earlier but he did not want [place] to touch dressing which is very bloody.  Repeat H/H reveals decline in the H/H.  I just took down the dressing.  Very bloody with oozing.  He has some wicks in place and I pulled a couple of them taking down dressing.  I don't see any specific bleeding sites.  I cleaned incision well and replaced DSD.  Abdomen is tender but not tight, no distension.    Objective: Vital signs in last 24 hours: Temp:  [97.3 F (36.3 C)-98.9 F (37.2 C)] 98.9 F (37.2 C) (11/01 1146) Pulse Rate:  [90-142] 119 (11/01 1400) Resp:  [9-25] 25 (11/01 1400) BP: (75-199)/(52-140) 88/60 (11/01 1400) SpO2:  [86 %-100 %] 93 % (11/01 1400) Weight:  [69.1 kg (152 lb 4.8 oz)] 69.1 kg (152 lb 4.8 oz) (10/31 2137)    Intake/Output from previous day: 10/31 0701 - 11/01 0700 In: 1666.6 [I.V.:616.6; IV Piggyback:1050] Out: 225 [Urine:175; Blood:50] Intake/Output this shift: Total I/O In: 20 [Other:20] Out: -   General appearance: alert, cooperative, no distress and anxious GI: Bloody dressing removed.  Site cleaned and a couple of the wick came out.  Abd not distended, or overly tight.  Oozing from some of the open areas below the wicks.    Lab Results:   Recent Labs  12/29/15 0532 12/29/15 1217  WBC 7.8 6.6  HGB 9.7* 8.2*  HCT 30.7* 26.1*  PLT 111* 108*    BMET  Recent Labs  12/29/15 0532 12/29/15 1217  NA 136 138  K 5.9* 6.0*  CL 98* 100*  CO2 23 24  GLUCOSE 122* 103*  BUN 52* 62*  CREATININE 12.76* 13.39*  CALCIUM 8.3* 8.2*   PT/INR No results for input(s): LABPROT, INR in the last 72 hours.   Recent Labs Lab 12/28/15 1011 12/29/15 0532  AST 56* 128*  ALT 22 37  ALKPHOS 41 38  BILITOT 1.0 0.8  PROT 8.0 6.5  ALBUMIN 3.0* 2.3*     Lipase     Component Value  Date/Time   LIPASE 43 12/28/2015 1011     Studies/Results: Ct Abdomen Pelvis Wo Contrast  Result Date: 12/28/2015 CLINICAL DATA:  Abdominal pain starting during dialysis today. Hypertension. EXAM: CT ABDOMEN AND PELVIS WITHOUT CONTRAST TECHNIQUE: Multidetector CT imaging of the abdomen and pelvis was performed following the standard protocol without IV contrast. COMPARISON:  Multiple exams, including the 11/07/2013 FINDINGS: Lower chest: Mild to moderate cardiomegaly. Hepatobiliary: Unremarkable Pancreas: Unremarkable Spleen: Unremarkable Adrenals/Urinary Tract: Unremarkable Stomach/Bowel: Suspected ileocolic intussusception, about 5.5 cm in length, with dilated right colon at about 6.3 cm diameter. Possible bowel wall thickening and irregularity in this region. I do not see dilated proximal loops of small bowel. Appendix not visible. Vascular/Lymphatic: No significant atherosclerotic calcification. Indistinctness of tissue planes around the aorta and IVC. Inguinal lymph nodes up to about 1.3 cm in short axis. Reduced sensitivity for adenopathy. Reproductive: Unremarkable Other: Pelvic ascites. Ascites in the right paracolic gutter. Diffuse edema in the subcutaneous and mesenteric tissues which in conjunction with the lack of oral and IV contrast causes poor definition of structures intra-abdominally. 1.9 by 3.9 by 2.8 cm area of accentuated density in the upper midline subcutaneous tissues, probably a small herniation of omentum. Intramedullary nail in the right femur only partially seen. Musculoskeletal: Unremarkable  IMPRESSION: 1. Dilated and potentially inflamed loop of bowel in the right abdomen is thought to be colon, and there appears to be incorporation of adipose tissue into the center of this structure strongly favoring ileocolic intussusception. 2. Because of the severity of fluid infiltration in the mesentery and fatty tissues, as well as the lack of oral and IV contrast, this abnormality in the  right abdomen is not entirely specific. I do favor intussusception. The fluid infiltration could be due to hypoproteinemia/hypoalbuminemia or right heart failure. 3. Suspected small ventral upper abdominal hernia containing omental adipose tissue. 4. Mild to moderate enlargement of the heart. 5. Ascites in the right paracolic gutter and a small amount of pelvic ascites. Electronically Signed   By: Van Clines M.D.   On: 12/28/2015 14:20   Dg Chest 2 View  Result Date: 12/28/2015 CLINICAL DATA:  35 year old male with generalized abdominal pain and shortness of breath after 2 hours of dialysis EXAM: CHEST  2 VIEW COMPARISON:  Prior chest x-ray 07/06/2015 FINDINGS: Stable cardiomegaly. Mediastinal contours are within normal limits. Mild scratch then no significant vascular congestion, pulmonary edema, pleural effusion or pneumothorax. No evidence of free air under the diaphragm. No focal airspace consolidation. Osseous structures are intact and unremarkable. The previously noted right IJ approach tunneled hemodialysis catheter has been removed. IMPRESSION: 1. Stable cardiomegaly. 2. No evidence of edema or other acute cardiopulmonary process. Electronically Signed   By: Jacqulynn Cadet M.D.   On: 12/28/2015 13:10    Medications: . abacavir  600 mg Oral Q supper  . amitriptyline  12.5 mg Oral QHS  . azithromycin  1,200 mg Oral Q Wed  . carvedilol  12.5 mg Oral BID WC  .  ceFAZolin (ANCEF) IV  1 g Intravenous Q24H  . Chlorhexidine Gluconate Cloth  6 each Topical Q0600  . dapsone  100 mg Oral Daily  . darunavir-cobicistat  1 tablet Oral QPM  . docusate sodium  100 mg Oral BID  . dolutegravir  50 mg Oral QPM  . HYDROmorphone   Intravenous Q4H  . [START ON 12/30/2015] levETIRAcetam  250 mg Oral Once per day on Tue Thu Sat  . levETIRAcetam  500 mg Oral Once per day on Sun Mon Wed Fri  . multivitamin  1 tablet Oral QHS  . mupirocin ointment  1 application Nasal BID  . sodium chloride  500 mL  Intravenous Once  . sodium chloride flush  3 mL Intravenous Q12H  . sucroferric oxyhydroxide  500 mg Oral TID WC  . white petrolatum       . sodium chloride 10 mL/hr at 12/29/15 5009    Assessment/Plan ileocolic intussusception which reduced during dissection, infarcted appendix Exploratory Laparotomy, ileocecectomy  12/28/15, Dr. Romana Juniper Post op hypotension Anemia/thrombocytopenia - H/H 12.6/38.3 yesterday AM, now down to 8.2/26.1 ESRD on HD HIV/AIDS  Seizures Hypertension Asthma FEN:  NPO except ice chips/Fluid bolus for BP FG:HWEXH pre op; azithromycin weekly DVT: SCD   Plan:  We cleaned him up and replaced with a dry dressing. CBC repeat at 1500 and again in the AM. I have added a type and screen.   Medicine and renal can transfuse as they think necessary.  We will watch.  If dressing soaks thru please call and let us look at it again.     LOS: 1 day    Rayder Sullenger 12/29/2015 [phone]

## 2015-12-30 LAB — CBC
HCT: 27.4 % — ABNORMAL LOW (ref 39.0–52.0)
HCT: 22.6 % — ABNORMAL LOW (ref 39.0–52.0)
HCT: 22.4 % — ABNORMAL LOW (ref 39.0–52.0)
Hemoglobin: 8.7 g/dL — ABNORMAL LOW (ref 13.0–17.0)
Hemoglobin: 7.3 g/dL — ABNORMAL LOW (ref 13.0–17.0)
Hemoglobin: 7.1 g/dL — ABNORMAL LOW (ref 13.0–17.0)
MCH: 29.4 pg (ref 26.0–34.0)
MCH: 29.2 pg (ref 26.0–34.0)
MCH: 28.7 pg (ref 26.0–34.0)
MCHC: 31.8 g/dL (ref 30.0–36.0)
MCHC: 32.3 g/dL (ref 30.0–36.0)
MCHC: 31.7 g/dL (ref 30.0–36.0)
MCV: 92.6 fL (ref 78.0–100.0)
MCV: 90.4 fL (ref 78.0–100.0)
MCV: 90.7 fL (ref 78.0–100.0)
Platelets: 97 10*3/uL — ABNORMAL LOW (ref 150–400)
Platelets: 79 10*3/uL — ABNORMAL LOW (ref 150–400)
Platelets: 79 10*3/uL — ABNORMAL LOW (ref 150–400)
RBC: 2.96 MIL/uL — ABNORMAL LOW (ref 4.22–5.81)
RBC: 2.5 MIL/uL — ABNORMAL LOW (ref 4.22–5.81)
RBC: 2.47 MIL/uL — ABNORMAL LOW (ref 4.22–5.81)
RDW: 17.7 % — ABNORMAL HIGH (ref 11.5–15.5)
RDW: 17.4 % — ABNORMAL HIGH (ref 11.5–15.5)
RDW: 16.9 % — ABNORMAL HIGH (ref 11.5–15.5)
WBC: 6.5 10*3/uL (ref 4.0–10.5)
WBC: 4.9 10*3/uL (ref 4.0–10.5)
WBC: 4.8 10*3/uL (ref 4.0–10.5)

## 2015-12-30 LAB — RENAL FUNCTION PANEL
Albumin: 2.6 g/dL — ABNORMAL LOW (ref 3.5–5.0)
Anion gap: 18 — ABNORMAL HIGH (ref 5–15)
BUN: 80 mg/dL — ABNORMAL HIGH (ref 6–20)
CO2: 21 mmol/L — ABNORMAL LOW (ref 22–32)
Calcium: 8.6 mg/dL — ABNORMAL LOW (ref 8.9–10.3)
Chloride: 98 mmol/L — ABNORMAL LOW (ref 101–111)
Creatinine, Ser: 14.43 mg/dL — ABNORMAL HIGH (ref 0.61–1.24)
GFR calc Af Amer: 5 mL/min — ABNORMAL LOW (ref >60)
GFR calc non Af Amer: 4 mL/min — ABNORMAL LOW (ref >60)
Glucose, Bld: 75 mg/dL (ref 65–99)
Phosphorus: 11.1 mg/dL — ABNORMAL HIGH (ref 2.5–4.6)
Potassium: 6.2 mmol/L — ABNORMAL HIGH (ref 3.5–5.1)
Sodium: 137 mmol/L (ref 135–145)

## 2015-12-30 LAB — HEPATITIS B SURFACE ANTIGEN: Hepatitis B Surface Ag: NEGATIVE

## 2015-12-30 LAB — PREPARE RBC (CROSSMATCH)

## 2015-12-30 MED ORDER — SILVER NITRATE-POT NITRATE 75-25 % EX MISC
5.0000 | Freq: Once | CUTANEOUS | Status: AC
  Administered 2015-12-30: 6 via TOPICAL
  Filled 2015-12-30: qty 5

## 2015-12-30 MED ORDER — HYDROMORPHONE HCL 1 MG/ML IJ SOLN
1.0000 mg | Freq: Once | INTRAMUSCULAR | Status: AC
  Administered 2015-12-30 (×2): 1 mg via INTRAVENOUS

## 2015-12-30 MED ORDER — SODIUM CHLORIDE 0.9 % IV SOLN
Freq: Once | INTRAVENOUS | Status: AC
  Administered 2015-12-30: 21:00:00 via INTRAVENOUS

## 2015-12-30 MED ORDER — HYDROMORPHONE HCL 1 MG/ML IJ SOLN
INTRAMUSCULAR | Status: AC
  Administered 2015-12-30: 1 mg via INTRAVENOUS
  Filled 2015-12-30: qty 1

## 2015-12-30 MED ORDER — SILVER NITRATE-POT NITRATE 75-25 % EX MISC
5.0000 "application " | Freq: Once | CUTANEOUS | Status: DC
  Filled 2015-12-30: qty 5

## 2015-12-30 NOTE — Progress Notes (Signed)
OT Cancellation Note  Patient Details Name: Jim Wyatt MRN: [id number] DOB: [birth year]   Cancelled Treatment:    Reason Eval/Treat Not Completed: Patient at procedure or test/ unavailable (HD). Will follow.  Malka So 12/30/2015, 8:47 AM  [phone]

## 2015-12-30 NOTE — Progress Notes (Signed)
Pt to HD

## 2015-12-30 NOTE — Progress Notes (Signed)
Dr. Hulen Skains at pts bedside to look at surgical site

## 2015-12-30 NOTE — Progress Notes (Signed)
Updated Dr. Hulen Skains on pts continuous bleeding at surgical site and am K+ result of 6.2. Orders to not use heparin. Dr. Jimmy Footman updated on situation also

## 2015-12-30 NOTE — Progress Notes (Signed)
[facility] Progress Note   Subjective: no c/o, BP's soft on HD  Vitals:   12/30/15 0830 12/30/15 0852 12/30/15 0900 12/30/15 0930  BP: 100/60 (!) 74/51 (!) 99/50 (!) 110/45  Pulse: (!) 114  (!) 104 (!) 101  Resp:      Temp:      TempSrc:      SpO2:      Weight:      Height:        Inpatient medications: . abacavir  600 mg Oral Q supper  . amitriptyline  12.5 mg Oral QHS  . azithromycin  1,200 mg Oral Q Wed  . carvedilol  12.5 mg Oral BID WC  .  ceFAZolin (ANCEF) IV  1 g Intravenous Q24H  . Chlorhexidine Gluconate Cloth  6 each Topical Q0600  . dapsone  100 mg Oral Daily  . darunavir-cobicistat  1 tablet Oral QPM  . docusate sodium  100 mg Oral BID  . dolutegravir  50 mg Oral QPM  . HYDROmorphone   Intravenous Q4H  . levETIRAcetam  250 mg Oral Once per day on Tue Thu Sat  . levETIRAcetam  500 mg Oral Once per day on Sun Mon Wed Fri  . multivitamin  1 tablet Oral QHS  . mupirocin ointment  1 application Nasal BID  . silver nitrate applicators  5 Stick Topical Once  . sodium chloride flush  3 mL Intravenous Q12H  . sucroferric oxyhydroxide  500 mg Oral TID WC   . sodium chloride 10 mL/hr at 12/29/15 0742   acetaminophen **OR** acetaminophen, albuterol, diphenhydrAMINE **OR** diphenhydrAMINE, HYDROmorphone (DILAUDID) injection, naloxone **AND** sodium chloride flush, ondansetron **OR** ondansetron (ZOFRAN) IV, traMADol  Exam: Gen thin AAF, lying flat, no distress No jvd or bruits Chest clear bilat RRR no MRG Abd large dressing mid-abdomen, dec'd BS MS no joint effusions or deformity Ext no LE edema / no wounds or ulcers Neuro is alert, Ox 3 , nf L arm AVF+bruit   [facility]   3h 42min   66.5kg   2/2 bath  P4   LFA AVF    Hep 5000 mircera 100 q 2, last 10/24 Calcitriol 0.75 tiw, last pth 975, P 7.6   Assessment: 1. SP ileocecectomy for intussusception - stable post op 2. 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 Vol is up  2-3kg  4. HTN - BP's low,  holding home dilt/ cozaar/hydral, on coreg only 5. HIV/ AIDS - last CD4 in Sept remained low at 30 6. Recent otitis externa/ media - + MRSA and staph lugdunensis, rx'd w vanc 7. Hypotension - prob from postp/ anesthesia/ PCA, 3rd spacing 8. Hyperkalemia - HD today 9. Anemia of CKD/ ABL - Hb stable 8-9 range, next esa due ~11/7   Plan - HD today, no UF w low BP's   Kelly Splinter MD [facility] pager [phone]   12/30/2015, 10:06 AM    Recent Labs Lab 12/29/15 0532 12/29/15 1217 12/30/15 0326  NA 136 138 137  K 5.9* 6.0* 6.2*  CL 98* 100* 98*  CO2 23 24 21*  GLUCOSE 122* 103* 75  BUN 52* 62* 80*  CREATININE 12.76* 13.39* 14.43*  CALCIUM 8.3* 8.2* 8.6*  PHOS  --   --  11.1*    Recent Labs Lab 12/28/15 1011 12/29/15 0532 12/30/15 0326  AST 56* 128*  --   ALT 22 37  --   ALKPHOS 41 38  --   BILITOT  1.0 0.8  --   PROT 8.0 6.5  --   ALBUMIN 3.0* 2.3* 2.6*    Recent Labs Lab 12/29/15 1512 12/29/15 2022 12/30/15 0326  WBC 6.0 6.3 6.5  HGB 7.2* 8.3* 8.7*  HCT 22.5* 26.2* 27.4*  MCV 91.5 89.7 92.6  PLT 98* 98* 97*   Iron/TIBC/Ferritin/ %Sat    Component Value Date/Time   IRON 59 01/12/2015 1617   TIBC 248 (L) 01/12/2015 1617   FERRITIN 484 (H) 01/12/2015 1617   IRONPCTSAT 24 01/12/2015 1617

## 2015-12-30 NOTE — Progress Notes (Signed)
Subjective: Bleeding seems to have slowed this morning, some ongoing oozing but better than yesterday. Jim Wyatt reports pain under better control today but still not passing gas. He is s/p 1 u pRBCs yesterday evening, Hb up to 8.7 this morning and vitals remain stable. Underwent hemodialysis this morning and has some soft pressures during, no heparin used.   Objective: Vital signs in last 24 hours: Vitals:   12/30/15 1100 12/30/15 1103 12/30/15 1200 12/30/15 1209  BP: 108/66 114/71  138/78  Pulse: (!) 107 (!) 109  (!) 108  Resp:  10 (!) 22 18  Temp:  97.9 F (36.6 C)  98.2 F (36.8 C)  TempSrc:  Oral  Oral  SpO2:  99% 100% 99%  Weight:  69.6 kg (153 lb 7 oz)    Height:        Intake/Output Summary (Last 24 hours) at 12/30/15 1547 Last data filed at 12/30/15 1103  Gross per 24 hour  Intake           302.92 ml  Output             1040 ml  Net          -737.08 ml   Physical Exam General appearance: Thin, tired-appearing gentleman resting in bed, alert and conversational HENT: Normocephalic, atraumatic Cardiovascular: Tachycardic rate but normal rhythm, no murmurs, rubs, gallops Respiratory/Chest: Clear to ausculation bilaterally, mildly tachypneic Abdomen: Midline abdominal incision present without sign of infection, small amounts of blood oozing around lower aspect, abdomen soft, moderately tender to palpation in all quadrants, worst in RLQ and periumbilical over incision, non-distended Skin: Warm, dry, intact Neuro: Cranial nerves grossly intact, alert and oriented Psych: Appropriate affect  Labs / Imaging / Procedures: CBC Latest Ref Rng & Units 12/30/2015 12/30/2015 12/29/2015  WBC 4.0 - 10.5 K/uL 4.9 6.5 6.3  Hemoglobin 13.0 - 17.0 g/dL 7.3(L) 8.7(L) 8.3(L)  Hematocrit 39.0 - 52.0 % 22.6(L) 27.4(L) 26.2(L)  Platelets 150 - 400 K/uL 79(L) 97(L) 98(L)   BMP Latest Ref Rng & Units 12/30/2015 12/29/2015 12/29/2015  Glucose 65 - 99 mg/dL 75 103(H) 122(H)  BUN 6 - 20 mg/dL  80(H) 62(H) 52(H)  Creatinine 0.61 - 1.24 mg/dL 14.43(H) 13.39(H) 12.76(H)  Sodium 135 - 145 mmol/L 137 138 136  Potassium 3.5 - 5.1 mmol/L 6.2(H) 6.0(H) 5.9(H)  Chloride 101 - 111 mmol/L 98(L) 100(L) 98(L)  CO2 22 - 32 mmol/L 21(L) 24 23  Calcium 8.9 - 10.3 mg/dL 8.6(L) 8.2(L) 8.3(L)   No results found.  Assessment/Plan: Jim Wyatt is a 35 y.o. gentleman with PMH AIDS, ESRD on HD TTS, ACD  admitted for acute abdominal, found to have ileocecal intussusception and taken to OR for resection.   Ileocecal intussusception, with infarcted appendix, s/p resection and anastomosis on 10/31, now POD#1 with hypovolemic shock and worsening anemia. BP this AM in 80s/60s, HR 120s, s/p 500cc bolus over the course of the day, now bp 120/73 with HR 107. Hb now 8.7 from 7.2 yesterday evening, improved oozing blood from surgical incision - Surgery following and appreciate recs, some silver nitrate sticks used for local hemostasis this afternoon - Remain NPO, adjusted to allow sips with meds - Dilaudid PCA for pain control - Frequent vitals, dressing assessment, can consider light fluid resuscitation if hypotension recurrs - Daily CBCs  Acute blood loss anemia, Hb 8.7 this AM from 7.2 previous evening, improving oozing at incision site, likely secondary to uremia in setting of ESRD, should improve with HD today -  Check CBC at 3pm, Addendum - Hb at 7.3, recheck CBC around 7pm, may require another unit pRBCs overnight - Abdominal exam and bleeding assessment with any change in pain  AIDS, last CD 30 in September, elevated viral load, previous difficulty with insurance.  - Restarted ART - Dapsone for PCP ppx - Started Azithro 1200mg  weekly for MAC ppx  ESRD on HD, s/p HD today with some low pressures during - Daily BMPs, follow renal rec  Hypertension - Holding meds in setting of hypotension  Seizure disorder - Continue home keppra.  Dispo: Anticipated discharge in approximately 3-5 day(s).     LOS: 2 days   Asencion Partridge, MD 12/30/2015, 3:47 PM Pager: [phone]

## 2015-12-30 NOTE — Progress Notes (Signed)
RN received a referral duirng his current hospitalization. RN arrived at the hospital and noted that patient has been discharged from the hospital early

## 2015-12-30 NOTE — Progress Notes (Signed)
2 Days Post-Op  Subjective: Dr. Hulen Skains saw him earlier this AM, and changed dressing, removed wicks.  The current dressing is pretty saturated and the oozing seems a little more than yesterday.  BP is also better.  Abd is not distended, and no peritonitis.  He is still complaining of some SOB  sats reading 99% room air.  Objective: Vital signs in last 24 hours: Temp:  [97.9 F (36.6 C)-98.4 F (36.9 C)] 98.2 F (36.8 C) (11/02 1209) Pulse Rate:  [90-119] 108 (11/02 1209) Resp:  [6-25] 18 (11/02 1209) BP: (74-152)/(45-139) 138/78 (11/02 1209) SpO2:  [91 %-100 %] 99 % (11/02 1209) Weight:  [69.6 kg (153 lb 7 oz)-70.7 kg (155 lb 12.8 oz)] 69.6 kg (153 lb 7 oz) (11/02 1103)   transfused 300 ml NPO  No urine output 1040 ml off in HD this AM Afebrile, Tachycardia is better, BP is better WBC is normal and his H/H has gone from 7.2 yesterday up to 8.7 this AM.   Platelets still low at 97K   Intake/Output from previous day: 11/01 0701 - 11/02 0700 In: 322.9 [Blood:302.9] Out: -  Intake/Output this shift: Total I/O In: -  Out: 1040 [Other:1040]  General appearance: alert, cooperative and no distress GI: still very tender, ongoing oozing, wicks are out.  May be more than yesterday.    Lab Results:   Recent Labs  12/29/15 2022 12/30/15 0326  WBC 6.3 6.5  HGB 8.3* 8.7*  HCT 26.2* 27.4*  PLT 98* 97*    BMET  Recent Labs  12/29/15 1217 12/30/15 0326  NA 138 137  K 6.0* 6.2*  CL 100* 98*  CO2 24 21*  GLUCOSE 103* 75  BUN 62* 80*  CREATININE 13.39* 14.43*  CALCIUM 8.2* 8.6*   PT/INR  Recent Labs  12/29/15 2022  LABPROT 15.2  INR 1.19     Recent Labs Lab 12/28/15 1011 12/29/15 0532 12/30/15 0326  AST 56* 128*  --   ALT 22 37  --   ALKPHOS 41 38  --   BILITOT 1.0 0.8  --   PROT 8.0 6.5  --   ALBUMIN 3.0* 2.3* 2.6*     Lipase     Component Value Date/Time   LIPASE 43 12/28/2015 1011     Studies/Results: Ct Abdomen Pelvis Wo Contrast  Result  Date: 12/28/2015 CLINICAL DATA:  Abdominal pain starting during dialysis today. Hypertension. EXAM: CT ABDOMEN AND PELVIS WITHOUT CONTRAST TECHNIQUE: Multidetector CT imaging of the abdomen and pelvis was performed following the standard protocol without IV contrast. COMPARISON:  Multiple exams, including the 11/07/2013 FINDINGS: Lower chest: Mild to moderate cardiomegaly. Hepatobiliary: Unremarkable Pancreas: Unremarkable Spleen: Unremarkable Adrenals/Urinary Tract: Unremarkable Stomach/Bowel: Suspected ileocolic intussusception, about 5.5 cm in length, with dilated right colon at about 6.3 cm diameter. Possible bowel wall thickening and irregularity in this region. I do not see dilated proximal loops of small bowel. Appendix not visible. Vascular/Lymphatic: No significant atherosclerotic calcification. Indistinctness of tissue planes around the aorta and IVC. Inguinal lymph nodes up to about 1.3 cm in short axis. Reduced sensitivity for adenopathy. Reproductive: Unremarkable Other: Pelvic ascites. Ascites in the right paracolic gutter. Diffuse edema in the subcutaneous and mesenteric tissues which in conjunction with the lack of oral and IV contrast causes poor definition of structures intra-abdominally. 1.9 by 3.9 by 2.8 cm area of accentuated density in the upper midline subcutaneous tissues, probably a small herniation of omentum. Intramedullary nail in the right femur only partially seen. Musculoskeletal: Unremarkable  IMPRESSION: 1. Dilated and potentially inflamed loop of bowel in the right abdomen is thought to be colon, and there appears to be incorporation of adipose tissue into the center of this structure strongly favoring ileocolic intussusception. 2. Because of the severity of fluid infiltration in the mesentery and fatty tissues, as well as the lack of oral and IV contrast, this abnormality in the right abdomen is not entirely specific. I do favor intussusception. The fluid infiltration could be due  to hypoproteinemia/hypoalbuminemia or right heart failure. 3. Suspected small ventral upper abdominal hernia containing omental adipose tissue. 4. Mild to moderate enlargement of the heart. 5. Ascites in the right paracolic gutter and a small amount of pelvic ascites. Electronically Signed   By: Van Clines M.D.   On: 12/28/2015 14:20   Dg Chest 2 View  Result Date: 12/28/2015 CLINICAL DATA:  35 year old male with generalized abdominal pain and shortness of breath after 2 hours of dialysis EXAM: CHEST  2 VIEW COMPARISON:  Prior chest x-ray 07/06/2015 FINDINGS: Stable cardiomegaly. Mediastinal contours are within normal limits. Mild scratch then no significant vascular congestion, pulmonary edema, pleural effusion or pneumothorax. No evidence of free air under the diaphragm. No focal airspace consolidation. Osseous structures are intact and unremarkable. The previously noted right IJ approach tunneled hemodialysis catheter has been removed. IMPRESSION: 1. Stable cardiomegaly. 2. No evidence of edema or other acute cardiopulmonary process. Electronically Signed   By: Jacqulynn Cadet M.D.   On: 12/28/2015 13:10    Medications: . abacavir  600 mg Oral Q supper  . amitriptyline  12.5 mg Oral QHS  . azithromycin  1,200 mg Oral Q Wed  . carvedilol  12.5 mg Oral BID WC  .  ceFAZolin (ANCEF) IV  1 g Intravenous Q24H  . Chlorhexidine Gluconate Cloth  6 each Topical Q0600  . dapsone  100 mg Oral Daily  . darunavir-cobicistat  1 tablet Oral QPM  . docusate sodium  100 mg Oral BID  . dolutegravir  50 mg Oral QPM  . HYDROmorphone   Intravenous Q4H  . levETIRAcetam  250 mg Oral Once per day on Tue Thu Sat  . levETIRAcetam  500 mg Oral Once per day on Sun Mon Wed Fri  . multivitamin  1 tablet Oral QHS  . mupirocin ointment  1 application Nasal BID  . silver nitrate applicators  5 Stick Topical Once  . sodium chloride flush  3 mL Intravenous Q12H  . sucroferric oxyhydroxide  500 mg Oral TID WC     Assessment/Plan ileocolic intussusception which reduced during dissection, infarcted appendix Exploratory Laparotomy, ileocecectomy  12/28/15, Dr. Romana Juniper  POD2 Post op hypotension Anemia/thrombocytopenia - H/H 12.6/38.3 yesterday AM, now down to 8.2/26.1 ESRD on HD HIV/AIDS  Seizures Hypertension Asthma FEN:  NPO except ice chips/Fluid bolus for BP MA:UQJFH pre op; azithromycin weekly DVT: SCD  Plan:  We have some silver nitrate at bedside.  I will order a staple removal, and decide about trying to use this to decrease bleeding. I do not see any active bleeding.   We removed staples, found one area lower 1/5 with some clot, we left that intact.  I found a couple other areas upper portion of the wound that was oozing and we used 5 silver nitrate sticks on this area.  Bleeding appears to have stopped.  We packed it dry for now.  Smaller dressing so nursing can follow more closely, with binder in place .     LOS: 2 days  Garth Diffley 12/30/2015 [phone]

## 2015-12-30 NOTE — Progress Notes (Signed)
Patient still oozing a fair amount from the supraumbilical surgical incision. Looks like dark blood, but the current dressing was just changed.  Previous dressing was from the beginning of the shift, about 7PM yesterday.  Seems to be slowing down, but have ordered some silver nitrate sticks to bedside for application.  Also, have aske his not to get heparin during dialysis if possible.  Kathryne Eriksson. Dahlia Bailiff, MD, [place] [phone] [facility] Surgery

## 2015-12-31 LAB — TYPE AND SCREEN
ABO/RH(D): O POS
Antibody Screen: NEGATIVE
Unit division: 0
Unit division: 0

## 2015-12-31 LAB — BASIC METABOLIC PANEL
Anion gap: 11 (ref 5–15)
BUN: 39 mg/dL — ABNORMAL HIGH (ref 6–20)
CO2: 26 mmol/L (ref 22–32)
Calcium: 8.4 mg/dL — ABNORMAL LOW (ref 8.9–10.3)
Chloride: 95 mmol/L — ABNORMAL LOW (ref 101–111)
Creatinine, Ser: 9.43 mg/dL — ABNORMAL HIGH (ref 0.61–1.24)
GFR calc Af Amer: 8 mL/min — ABNORMAL LOW (ref >60)
GFR calc non Af Amer: 7 mL/min — ABNORMAL LOW (ref >60)
Glucose, Bld: 76 mg/dL (ref 65–99)
Potassium: 4.4 mmol/L (ref 3.5–5.1)
Sodium: 132 mmol/L — ABNORMAL LOW (ref 135–145)

## 2015-12-31 LAB — HEPATITIS B SURFACE ANTIBODY,QUALITATIVE: Hep B S Ab: NONREACTIVE

## 2015-12-31 LAB — CBC
HCT: 24.8 % — ABNORMAL LOW (ref 39.0–52.0)
Hemoglobin: 8 g/dL — ABNORMAL LOW (ref 13.0–17.0)
MCH: 29.2 pg (ref 26.0–34.0)
MCHC: 32.3 g/dL (ref 30.0–36.0)
MCV: 90.5 fL (ref 78.0–100.0)
Platelets: 67 10*3/uL — ABNORMAL LOW (ref 150–400)
RBC: 2.74 MIL/uL — ABNORMAL LOW (ref 4.22–5.81)
RDW: 16 % — ABNORMAL HIGH (ref 11.5–15.5)
WBC: 5.2 10*3/uL (ref 4.0–10.5)

## 2015-12-31 LAB — GLUCOSE, CAPILLARY: Glucose-Capillary: 81 mg/dL (ref 65–99)

## 2015-12-31 MED ORDER — HYDRALAZINE HCL 50 MG PO TABS
100.0000 mg | ORAL_TABLET | Freq: Three times a day (TID) | ORAL | Status: DC
  Administered 2015-12-31 – 2016-01-04 (×13): 100 mg via ORAL
  Filled 2015-12-31 (×13): qty 2

## 2015-12-31 MED ORDER — INFLUENZA VAC SPLIT QUAD 0.5 ML IM SUSY
0.5000 mL | PREFILLED_SYRINGE | INTRAMUSCULAR | Status: DC | PRN
Start: 2015-12-31 — End: 2016-01-04

## 2015-12-31 MED ORDER — HYDROMORPHONE 1 MG/ML IV SOLN
INTRAVENOUS | Status: DC
  Administered 2015-12-31: 1.6 mg via INTRAVENOUS
  Administered 2015-12-31: 0.8 mg via INTRAVENOUS
  Administered 2016-01-01: 1.6 mg via INTRAVENOUS
  Administered 2016-01-01: 2.6 mg via INTRAVENOUS
  Administered 2016-01-01: 13 mg via INTRAVENOUS
  Administered 2016-01-01: 1.2 mg via INTRAVENOUS
  Administered 2016-01-02: 14 mg via INTRAVENOUS
  Administered 2016-01-02: 7 mg via INTRAVENOUS
  Administered 2016-01-02: 2 mg via INTRAVENOUS
  Filled 2015-12-31 (×2): qty 25

## 2015-12-31 MED ORDER — DIPHENHYDRAMINE HCL 12.5 MG/5ML PO ELIX
12.5000 mg | ORAL_SOLUTION | Freq: Four times a day (QID) | ORAL | Status: DC | PRN
  Filled 2015-12-31: qty 5

## 2015-12-31 MED ORDER — LOSARTAN POTASSIUM 50 MG PO TABS
50.0000 mg | ORAL_TABLET | Freq: Every day | ORAL | Status: DC
  Administered 2015-12-31: 50 mg via ORAL
  Filled 2015-12-31: qty 1

## 2015-12-31 MED ORDER — PNEUMOCOCCAL VAC POLYVALENT 25 MCG/0.5ML IJ INJ: 0.5000 mL | INJECTION | INTRAMUSCULAR | Status: DC | PRN

## 2015-12-31 MED ORDER — DIPHENHYDRAMINE HCL 50 MG/ML IJ SOLN: 12.5000 mg | Freq: Four times a day (QID) | INTRAMUSCULAR | Status: DC | PRN

## 2015-12-31 MED ORDER — NALOXONE HCL 0.4 MG/ML IJ SOLN: 0.4000 mg | INTRAMUSCULAR | Status: DC | PRN

## 2015-12-31 MED ORDER — SODIUM CHLORIDE 0.9% FLUSH: 9.0000 mL | INTRAVENOUS | Status: DC | PRN

## 2015-12-31 NOTE — Progress Notes (Signed)
[facility] Progress Note   Subjective:  "I'm OK". Sitting up in bed, sleeping intermittently. Has clear liquid lunch. Denies pain.   Objective Vitals:   12/31/15 0735 12/31/15 1023 12/31/15 1110 12/31/15 1220  BP: (!) 164/107  (!) 161/103   Pulse: 96  93   Resp: 12  14 18   Temp: 98 F (36.7 C) 98.7 F (37.1 C) 98.3 F (36.8 C)   TempSrc: Oral Oral Oral   SpO2: 99%  100%   Weight:      Height:       Physical Exam General: slightly lethargic, NAD. Heart: S1,S2, RRR. SR on monitor.  Lungs:BBS CTA Abdomen: Mid line incision, abd binder in place. Hypo-active BS. Non-distended.  Extremities: No LE edema.  Dialysis Access: LFA AVF + bruit  [facility] 3h 54min 66.5kg 2/2 bath P4 LFA AVF Hep 5000 mircera 100 q 2, last 10/24 Calcitriol 0.75 tiw, last pth 975, P 7.6  Additional Objective Labs: Basic Metabolic Panel:  Recent Labs Lab 12/29/15 1217 12/30/15 0326 12/31/15 0448  NA 138 137 132*  K 6.0* 6.2* 4.4  CL 100* 98* 95*  CO2 24 21* 26  GLUCOSE 103* 75 76  BUN 62* 80* 39*  CREATININE 13.39* 14.43* 9.43*  CALCIUM 8.2* 8.6* 8.4*  PHOS  --  11.1*  --    Liver Function Tests:  Recent Labs Lab 12/28/15 1011 12/29/15 0532 12/30/15 0326  AST 56* 128*  --   ALT 22 37  --   ALKPHOS 41 38  --   BILITOT 1.0 0.8  --   PROT 8.0 6.5  --   ALBUMIN 3.0* 2.3* 2.6*    Recent Labs Lab 12/28/15 1011  LIPASE 43   CBC:  Recent Labs Lab 12/29/15 2022 12/30/15 0326 12/30/15 1416 12/30/15 1810 12/31/15 0448  WBC 6.3 6.5 4.9 4.8 5.2  HGB 8.3* 8.7* 7.3* 7.1* 8.0*  HCT 26.2* 27.4* 22.6* 22.4* 24.8*  MCV 89.7 92.6 90.4 90.7 90.5  PLT 98* 97* 79* 79* 67*   Blood Culture    Component Value Date/Time   SDES EAR RIGHT 11/23/2015 1638   SPECREQUEST NONE 11/23/2015 1638   CULT  11/23/2015 1638    RARE METHICILLIN RESISTANT STAPHYLOCOCCUS AUREUS RARE STAPHYLOCOCCUS LUGDUNENSIS    REPTSTATUS 11/27/2015 FINAL 11/23/2015 1638     Cardiac Enzymes:  Recent Labs Lab 12/28/15 1036  TROPONINI 0.05*   CBG:  Recent Labs Lab 12/29/15 0838 12/31/15 0733  GLUCAP 109* 81   Iron Studies: No results for input(s): IRON, TIBC, TRANSFERRIN, FERRITIN in the last 72 hours. @lablastinr3 @ Studies/Results: No results found. Medications: . sodium chloride 10 mL/hr at 12/30/15 1700   . abacavir  600 mg Oral Q supper  . amitriptyline  12.5 mg Oral QHS  . azithromycin  1,200 mg Oral Q Wed  . carvedilol  12.5 mg Oral BID WC  .  ceFAZolin (ANCEF) IV  1 g Intravenous Q24H  . Chlorhexidine Gluconate Cloth  6 each Topical Q0600  . dapsone  100 mg Oral Daily  . darunavir-cobicistat  1 tablet Oral QPM  . docusate sodium  100 mg Oral BID  . dolutegravir  50 mg Oral QPM  . hydrALAZINE  100 mg Oral Q8H  . HYDROmorphone   Intravenous Q4H  . levETIRAcetam  250 mg Oral Once per day on Tue Thu Sat  . levETIRAcetam  500 mg Oral Once per day on Sun Mon Wed Fri  . losartan  50 mg Oral Daily  .  multivitamin  1 tablet Oral QHS  . mupirocin ointment  1 application Nasal BID  . silver nitrate applicators  5 application Topical Once  . sodium chloride flush  3 mL Intravenous Q12H  . sucroferric oxyhydroxide  500 mg Oral TID WC     Assessment/Plan: 1. SP ileocecectomy for intussusception: Post op day 3. On PCA dilaudid. Taking clear liquids. Per primary.  2. [facility]D -[facility]. For HD tomorrow. No heparin.  3. Anemia -HGB 8.0. ESA due 11/07. Follow HGB.  4. Secondary hyperparathyroidism - Cont binders/VDRA. Ca 8.4  5. HTN/volume - HD 12/30/15 Pre Wt 70.2 Net UF 1040 Post wt 69.6. Still 3.1 above OP EDW. HD tomorrow. Attempt 3-3.5 liters as tolerated.  6. Nutrition - Albumin 2.6 clear liq diet. Add prostat/renal vit when taking solid food.  7. HIV/AIDS: Per primary.  8. Otitis externa:+ MRSA and staph lugdunensis Treated with Vanc.   Rita H. Brown NP-C 12/31/2015, 1:27 PM  [facility] [phone]  Pt seen,  examined and agree w A/P as above.  Kelly Splinter MD [employer] pager [phone]   12/31/2015, 2:56 PM

## 2015-12-31 NOTE — Progress Notes (Signed)
PT Cancellation Note  Patient Details Name: Jim Wyatt MRN: [id number] DOB: December 11, 1980   Cancelled Treatment:    Reason Eval/Treat Not Completed: Patient declined, no reason specified. Pt refusing participation in therapy session at this time, stating that he was "too tired". PT reviewed importance of mobility while in hospital and pt stated that he has been OOB in the recliner since previous session. PT will continue to f/u with pt as appropriate.   Jim Wyatt 12/31/2015, 8:46 AM Sherie Don, PT, DPT [phone]

## 2015-12-31 NOTE — Progress Notes (Signed)
3 Days Post-Op  Subjective: No flatus, does have bowel sounds, no further bleeding.   Objective: Vital signs in last 24 hours: Temp:  [98 F (36.7 C)-99.1 F (37.3 C)] 98.3 F (36.8 C) (11/03 1110) Pulse Rate:  [93-117] 93 (11/03 1110) Resp:  [11-21] 18 (11/03 1220) BP: (149-176)/(96-107) 161/103 (11/03 1110) SpO2:  [97 %-100 %] 100 % (11/03 1110)  NPO Afebrile, BP back up Creatinine is better , H/H is stable  Intake/Output from previous day: 11/02 0701 - 11/03 0700 In: 735 [I.V.:400; Blood:335] Out: 1040  Intake/Output this shift: No intake/output data recorded.  General appearance: alert and no distress GI: few BS, no flatus, wet to dry dressing started. Incision looks fine.  Lab Results:   Recent Labs  12/30/15 1810 12/31/15 0448  WBC 4.8 5.2  HGB 7.1* 8.0*  HCT 22.4* 24.8*  PLT 79* 67*    BMET  Recent Labs  12/30/15 0326 12/31/15 0448  NA 137 132*  K 6.2* 4.4  CL 98* 95*  CO2 21* 26  GLUCOSE 75 76  BUN 80* 39*  CREATININE 14.43* 9.43*  CALCIUM 8.6* 8.4*   PT/INR  Recent Labs  12/29/15 2022  LABPROT 15.2  INR 1.19     Recent Labs Lab 12/28/15 1011 12/29/15 0532 12/30/15 0326  AST 56* 128*  --   ALT 22 37  --   ALKPHOS 41 38  --   BILITOT 1.0 0.8  --   PROT 8.0 6.5  --   ALBUMIN 3.0* 2.3* 2.6*     Lipase     Component Value Date/Time   LIPASE 43 12/28/2015 1011     Studies/Results: No results found.  Medications: . abacavir  600 mg Oral Q supper  . amitriptyline  12.5 mg Oral QHS  . azithromycin  1,200 mg Oral Q Wed  . carvedilol  12.5 mg Oral BID WC  .  ceFAZolin (ANCEF) IV  1 g Intravenous Q24H  . Chlorhexidine Gluconate Cloth  6 each Topical Q0600  . dapsone  100 mg Oral Daily  . darunavir-cobicistat  1 tablet Oral QPM  . docusate sodium  100 mg Oral BID  . dolutegravir  50 mg Oral QPM  . hydrALAZINE  100 mg Oral Q8H  . HYDROmorphone   Intravenous Q4H  . levETIRAcetam  250 mg Oral Once per day on Tue Thu Sat  .  levETIRAcetam  500 mg Oral Once per day on Sun Mon Wed Fri  . losartan  50 mg Oral Daily  . multivitamin  1 tablet Oral QHS  . mupirocin ointment  1 application Nasal BID  . silver nitrate applicators  5 application Topical Once  . sodium chloride flush  3 mL Intravenous Q12H  . sucroferric oxyhydroxide  500 mg Oral TID WC    Assessment/Plan ileocolic intussusception which reduced during dissection, infarcted appendix Exploratory Laparotomy, ileocecectomy 12/28/15, Dr. Romana Juniper  POD2 Post op hypotension Anemia/thrombocytopenia- H/H 12.6/38.3 yesterday AM, now down to 8.2/26.1 ESRD on HD HIV/AIDS  Seizures Hypertension Asthma FEN: NPO except ice chips/Fluid bolus for BP KT:GYBWL pre op; azithromycin weekly DVT: SCD   Plan:  Sips and chips for now.  If he does well we can advance his diet slowly.  He can start the antiviral when he can keep clear liquids down.    LOS: 3 days    Jim Wyatt 12/31/2015 [phone]

## 2015-12-31 NOTE — Progress Notes (Signed)
Subjective: Jim Wyatt states that he feels tired this morning, was able to sleep "a little." Received 1 u pRBCs overnight for Hb 7.1. Pain feels about the same. Reports not passing gas. When asked about his refusing certain oral medications (ART, AIDS ppx meds) he states that they cause discomfort if taken on an empty stomach. Report tolerating sips/chips, amenable to trial of clear liquids.   Objective: Vital signs in last 24 hours: Vitals:   12/31/15 0636 12/31/15 0735 12/31/15 1023 12/31/15 1110  BP:  (!) 164/107  (!) 161/103  Pulse:  96  93  Resp: 12 12  14   Temp:  98 F (36.7 C) 98.7 F (37.1 C) 98.3 F (36.8 C)  TempSrc:  Oral Oral Oral  SpO2: 100% 99%  100%  Weight:      Height:        Intake/Output Summary (Last 24 hours) at 12/31/15 1114 Last data filed at 12/31/15 0158  Gross per 24 hour  Intake              735 ml  Output                0 ml  Net              735 ml   Physical Exam General appearance: Thin, tired-appearing gentleman resting in bed, somnolent HENT: Normocephalic, atraumatic Cardiovascular: Normal rate and rhythm, no murmurs, rubs, gallops Respiratory/Chest: Clear to ausculation bilaterally, mildly tachypneic Abdomen: Bowel sounds present, abdominal binder in place, visible underlying bandage clean/dry/intact, no oozing of blood, moderately tender to palpation in all quadrants, worst in RLQ and periumbilical over incision, non-distended Skin: Warm, dry, intact Psych: Appropriate affect  Labs / Imaging / Procedures: CBC Latest Ref Rng & Units 12/31/2015 12/30/2015 12/30/2015  WBC 4.0 - 10.5 K/uL 5.2 4.8 4.9  Hemoglobin 13.0 - 17.0 g/dL 8.0(L) 7.1(L) 7.3(L)  Hematocrit 39.0 - 52.0 % 24.8(L) 22.4(L) 22.6(L)  Platelets 150 - 400 K/uL 67(L) 79(L) 79(L)   BMP Latest Ref Rng & Units 12/31/2015 12/30/2015 12/29/2015  Glucose 65 - 99 mg/dL 76 75 103(H)  BUN 6 - 20 mg/dL 39(H) 80(H) 62(H)  Creatinine 0.61 - 1.24 mg/dL 9.43(H) 14.43(H) 13.39(H)  Sodium  135 - 145 mmol/L 132(L) 137 138  Potassium 3.5 - 5.1 mmol/L 4.4 6.2(H) 6.0(H)  Chloride 101 - 111 mmol/L 95(L) 98(L) 100(L)  CO2 22 - 32 mmol/L 26 21(L) 24  Calcium 8.9 - 10.3 mg/dL 8.4(L) 8.6(L) 8.2(L)    1d ago  Hep B S Ab Non Reactive    No results found.  Assessment/Plan: Jim Wyatt is a 35 y.o. gentleman with PMH AIDS, ESRD on HD TTS, ACD  admitted for acute abdominal, found to have ileocecal intussusception and taken to OR for resection.   Ileocecal intussusception, with infarcted appendix, s/p resection and anastomosis on 10/31, now POD#3 with resolved post-op bleeding. Hb now 8.0 from 7.1 yesterday evening, oozing blood from surgical incision has stopped - Surgery following and appreciate recs, some silver nitrate sticks used for local hemostasis - Tolerated sips/chips, will advance to clear liquid diet - Dilaudid PCA for pain control - Stable for transfer to med-surg room - Daily CBCs  Acute blood loss anemia, secondary to surgery in setting of hyperuremia, resolving, Hb 8.0 this AM from 7.1 previous evening, s/p 1 u pRBCs overnight - Daily CBCs  AIDS, last CD 30 in September, elevated viral load, previous difficulty with insurance, refusing to take ART or ppx without  food - Attempt advancing diet - ART - Dapsone for PCP ppx - Azithro 1200mg  weekly for MAC ppx  ESRD on HD, s/p HD on 11/2 - Daily BMPs, follow renal recs  Hypertension, bp 175/105 this AM, home meds previously held - Restart Hydralazine 100 TID - Restart Losartan 50 mg daily (1/2 home dose)  Seizure disorder - Continue home keppra.  Dispo: Anticipated discharge in approximately 2-4 day(s).   LOS: 3 days   Asencion Partridge, MD 12/31/2015, 11:14 AM Pager: [phone]

## 2015-12-31 NOTE — Progress Notes (Signed)
Patient removed PCA pulse ox sensor and refuses to wear it now.  RN spoke with Internal Medicine Resident, OK with patient getting pulse ox checked intermittently with Q4 VS.

## 2015-12-31 NOTE — Progress Notes (Signed)
OT Cancellation Note  Patient Details Name: Jim Wyatt MRN: [id number] DOB: 1980/12/12   Cancelled Treatment:    Reason Eval/Treat Not Completed: Fatigue/lethargy limiting ability to participate;Pain limiting ability to participate. Pt responding verbally, but not opening his eyes. Educated in benefits of activity.  Malka So 12/31/2015, 2:21 PM

## 2015-12-31 NOTE — Progress Notes (Signed)
PT Cancellation Note  Patient Details Name: Jim Wyatt MRN: [id number] DOB: June 10, 1980   Cancelled Treatment:    Reason Eval/Treat Not Completed: Patient declined, no reason specified;Fatigue/lethargy limiting ability to participate. Pt continuing to be very lethargic this PM and refusing to participate in therapy session. PT will continue to f/u with pt as appropriate.   Clearnce Sorrel Mirca Yale 12/31/2015, 5:30 PM Sherie Don, [place], DPT [phone]

## 2016-01-01 ENCOUNTER — Inpatient Hospital Stay (HOSPITAL_COMMUNITY): Payer: BLUE CROSS/BLUE SHIELD

## 2016-01-01 LAB — BASIC METABOLIC PANEL
Anion gap: 12 (ref 5–15)
BUN: 52 mg/dL — ABNORMAL HIGH (ref 6–20)
CO2: 24 mmol/L (ref 22–32)
Calcium: 8.5 mg/dL — ABNORMAL LOW (ref 8.9–10.3)
Chloride: 96 mmol/L — ABNORMAL LOW (ref 101–111)
Creatinine, Ser: 12.03 mg/dL — ABNORMAL HIGH (ref 0.61–1.24)
GFR calc Af Amer: 6 mL/min — ABNORMAL LOW (ref >60)
GFR calc non Af Amer: 5 mL/min — ABNORMAL LOW (ref >60)
Glucose, Bld: 65 mg/dL (ref 65–99)
Potassium: 4.5 mmol/L (ref 3.5–5.1)
Sodium: 132 mmol/L — ABNORMAL LOW (ref 135–145)

## 2016-01-01 LAB — CBC
HCT: 27.2 % — ABNORMAL LOW (ref 39.0–52.0)
Hemoglobin: 8.8 g/dL — ABNORMAL LOW (ref 13.0–17.0)
MCH: 29.1 pg (ref 26.0–34.0)
MCHC: 32.4 g/dL (ref 30.0–36.0)
MCV: 90.1 fL (ref 78.0–100.0)
Platelets: 83 10*3/uL — ABNORMAL LOW (ref 150–400)
RBC: 3.02 MIL/uL — ABNORMAL LOW (ref 4.22–5.81)
RDW: 15.7 % — ABNORMAL HIGH (ref 11.5–15.5)
WBC: 6.5 10*3/uL (ref 4.0–10.5)

## 2016-01-01 LAB — GLUCOSE, CAPILLARY: Glucose-Capillary: 71 mg/dL (ref 65–99)

## 2016-01-01 LAB — PHOSPHORUS: Phosphorus: 7 mg/dL — ABNORMAL HIGH (ref 2.5–4.6)

## 2016-01-01 LAB — ALBUMIN: Albumin: 2.4 g/dL — ABNORMAL LOW (ref 3.5–5.0)

## 2016-01-01 MED ORDER — LIDOCAINE HCL (PF) 1 % IJ SOLN: 5.0000 mL | INTRAMUSCULAR | Status: DC | PRN

## 2016-01-01 MED ORDER — SODIUM CHLORIDE 0.9 % IV SOLN: 100.0000 mL | INTRAVENOUS | Status: DC | PRN

## 2016-01-01 MED ORDER — LIDOCAINE-PRILOCAINE 2.5-2.5 % EX CREA: 1.0000 "application " | TOPICAL_CREAM | CUTANEOUS | Status: DC | PRN

## 2016-01-01 MED ORDER — LOSARTAN POTASSIUM 50 MG PO TABS
100.0000 mg | ORAL_TABLET | Freq: Every day | ORAL | Status: DC
  Administered 2016-01-01 – 2016-01-03 (×3): 100 mg via ORAL
  Filled 2016-01-01 (×3): qty 2

## 2016-01-01 MED ORDER — PENTAFLUOROPROP-TETRAFLUOROETH EX AERO: 1.0000 "application " | INHALATION_SPRAY | CUTANEOUS | Status: DC | PRN

## 2016-01-01 NOTE — Progress Notes (Signed)
[facility] Progress Note   Subjective: "I'm OK". Doesn't respond to verbal unless questions repeated, although he is awake and oriented.  On HD tolerating well. Contact isolation for MRSA.  Objective Vitals:   01/01/16 0238 01/01/16 0455 01/01/16 0551 01/01/16 0825  BP: (!) 154/96  (!) 143/91 (!) 174/96  Pulse: 94  91 96  Resp: 16 15 14 16   Temp: 99.4 F (37.4 C)  99.8 F (37.7 C) 98.5 F (36.9 C)  TempSrc: Oral  Oral Oral  SpO2: 98% 96% 96%   Weight:      Height:       PPhysical Exam General: Awake, alert, NAD. Heart: S1,S2, RRR. SR on monitor.  Lungs:BBS CTA Abdomen: Mid line incision, abd binder in place. Hypo-active BS. Non-distended.  Extremities: No LE edema.  Dialysis Access: LFA AVF cannulated at present  [facility] 3h 107min 66.5kg 2/2 bath P4 LFA AVF Hep 5000 mircera 100 q 2, last 10/24 Calcitriol 0.75 tiw, last pth 975, P 7.6 Additional Objective Labs: Basic Metabolic Panel:  Recent Labs Lab 12/30/15 0326 12/31/15 0448 01/01/16 0350  NA 137 132* 132*  K 6.2* 4.4 4.5  CL 98* 95* 96*  CO2 21* 26 24  GLUCOSE 75 76 65  BUN 80* 39* 52*  CREATININE 14.43* 9.43* 12.03*  CALCIUM 8.6* 8.4* 8.5*  PHOS 11.1*  --   --    Liver Function Tests:  Recent Labs Lab 12/28/15 1011 12/29/15 0532 12/30/15 0326  AST 56* 128*  --   ALT 22 37  --   ALKPHOS 41 38  --   BILITOT 1.0 0.8  --   PROT 8.0 6.5  --   ALBUMIN 3.0* 2.3* 2.6*    Recent Labs Lab 12/28/15 1011  LIPASE 43   CBC:  Recent Labs Lab 12/30/15 0326 12/30/15 1416 12/30/15 1810 12/31/15 0448 01/01/16 0350  WBC 6.5 4.9 4.8 5.2 6.5  HGB 8.7* 7.3* 7.1* 8.0* 8.8*  HCT 27.4* 22.6* 22.4* 24.8* 27.2*  MCV 92.6 90.4 90.7 90.5 90.1  PLT 97* 79* 79* 67* 83*   Blood Culture    Component Value Date/Time   SDES EAR RIGHT 11/23/2015 1638   SPECREQUEST NONE 11/23/2015 1638   CULT  11/23/2015 1638    RARE METHICILLIN RESISTANT STAPHYLOCOCCUS AUREUS RARE  STAPHYLOCOCCUS LUGDUNENSIS    REPTSTATUS 11/27/2015 FINAL 11/23/2015 1638    Cardiac Enzymes:  Recent Labs Lab 12/28/15 1036  TROPONINI 0.05*   CBG:  Recent Labs Lab 12/29/15 0838 12/31/15 0733 01/01/16 0734  GLUCAP 109* 81 71   Iron Studies: No results for input(s): IRON, TIBC, TRANSFERRIN, FERRITIN in the last 72 hours. @lablastinr3 @ Studies/Results: No results found. Medications: . sodium chloride 1 mL (12/31/15 1523)   . abacavir  600 mg Oral Q supper  . amitriptyline  12.5 mg Oral QHS  . azithromycin  1,200 mg Oral Q Wed  . carvedilol  12.5 mg Oral BID WC  . Chlorhexidine Gluconate Cloth  6 each Topical Q0600  . dapsone  100 mg Oral Daily  . darunavir-cobicistat  1 tablet Oral QPM  . docusate sodium  100 mg Oral BID  . dolutegravir  50 mg Oral QPM  . hydrALAZINE  100 mg Oral Q8H  . HYDROmorphone   Intravenous Q4H  . levETIRAcetam  250 mg Oral Once per day on Tue Thu Sat  . levETIRAcetam  500 mg Oral Once per day on Sun Mon Wed Fri  . losartan  100 mg Oral Daily  .  multivitamin  1 tablet Oral QHS  . mupirocin ointment  1 application Nasal BID  . sodium chloride flush  3 mL Intravenous Q12H  . sucroferric oxyhydroxide  500 mg Oral TID WC     Assessment/Plan: 1. SP ileocecectomy for intussusception: Post op day 4. On PCA dilaudid. Taking clear liquids. No Nausea. Per primary 2. ESRD -[facility]. On HD. No heparin. K+ 4.5 3. Anemia -HGB ^ 8.8. ESA due 11/07. Follow HGB.  4. Secondary hyperparathyroidism - Cont binders/VDRA. Ca 8.5. Last Phos 11.1. Recheck today.  5. HTN/volume - Pre Wt 69.2 UFG 4000 BP 174/96. Continue coreg, hydralazine and losartan.  6. Nutrition - Albumin 2.6-recheck today. clear liq diet.Add nepro when diet advanced. Renal vit  7. HIV/AIDS: Per primary.  8. Otitis externa:+MRSA and staph lugdunensis Treated with Vanc.   Rita H. Brown NP-C 01/01/2016, 8:38 AM  [facility] [phone]  Pt seen, examined and agree w A/P  as above. Up 3kg, BP's borderline high, cont BP meds, UF to dry wt w HD today.  Awaiting recovery of bowel function.  Kelly Splinter MD [employer] pager [phone]   01/01/2016, 11:42 AM

## 2016-01-01 NOTE — Progress Notes (Signed)
4 Days Post-Op  Subjective: No bowel function  Objective: Vital signs in last 24 hours: Temp:  [98.5 F (36.9 C)-100.4 F (38 C)] 98.5 F (36.9 C) (11/04 0825) Pulse Rate:  [91-113] 113 (11/04 1030) Resp:  [11-18] 16 (11/04 1030) BP: (112-177)/(56-111) 112/56 (11/04 1030) SpO2:  [92 %-100 %] 97 % (11/04 0825) Weight:  [69.2 kg (152 lb 8.9 oz)-74 kg (163 lb 2.3 oz)] 69.2 kg (152 lb 8.9 oz) (11/04 0825) Last BM Date: 12/28/15  Intake/Output from previous day: 11/03 0701 - 11/04 0700 In: 557.3 [P.O.:120; I.V.:337.3; IV Piggyback:100] Out: -  Intake/Output this shift: No intake/output data recorded.  General appearance: alert and cooperative GI: soft, approp ttp, ND, decerased BS, incision c/d/i  Lab Results:   Recent Labs  12/31/15 0448 01/01/16 0350  WBC 5.2 6.5  HGB 8.0* 8.8*  HCT 24.8* 27.2*  PLT 67* 83*   BMET  Recent Labs  12/31/15 0448 01/01/16 0350  NA 132* 132*  K 4.4 4.5  CL 95* 96*  CO2 26 24  GLUCOSE 76 65  BUN 39* 52*  CREATININE 9.43* 12.03*  CALCIUM 8.4* 8.5*   PT/INR  Recent Labs  12/29/15 2022  LABPROT 15.2  INR 1.19   ABG No results for input(s): PHART, HCO3 in the last 72 hours.  Invalid input(s): PCO2, PO2  Studies/Results: No results found.  Anti-infectives: Anti-infectives    Start     Dose/Rate Route Frequency Ordered Stop   12/29/15 1600  ceFAZolin (ANCEF) IVPB 1 g/50 mL premix  Status:  Discontinued     1 g 100 mL/hr over 30 Minutes Intravenous Every 24 hours 12/28/15 2141 12/31/15 1621   12/29/15 1000  azithromycin (ZITHROMAX) tablet 1,200 mg     1,200 mg Oral Every Wed 12/28/15 1528     12/29/15 1000  dapsone tablet 100 mg     100 mg Oral Daily 12/28/15 1528     12/28/15 2200  abacavir (ZIAGEN) tablet 600 mg     600 mg Oral Daily with supper 12/28/15 1528     12/28/15 2200  darunavir-cobicistat (PREZCOBIX) 800-150 MG per tablet 1 tablet     1 tablet Oral Every evening 12/28/15 1528     12/28/15 2200   dolutegravir (TIVICAY) tablet 50 mg     50 mg Oral Every evening 12/28/15 1528     12/28/15 1930  metroNIDAZOLE (FLAGYL) IVPB 500 mg     500 mg 100 mL/hr over 60 Minutes Intravenous  Once 12/28/15 1917 12/28/15 2005   12/28/15 1545  ceFAZolin (ANCEF) IVPB 1 g/50 mL premix  Status:  Discontinued     1 g 100 mL/hr over 30 Minutes Intravenous Every 8 hours 12/28/15 1541 12/28/15 2140      Assessment/Plan: s/p Procedure(s): EXPLORATORY LAPAROTOMY BOWEL RESECTION ILEOCECECTOMY (N/A) Await bowel function Mobilize WTD dressign changes   LOS: 4 days    Rosario Jacks., Jabre Heo 01/01/2016

## 2016-01-01 NOTE — Progress Notes (Signed)
Subjective: [name] reports some improvement in his post-op pain today. States that he still presses PCA button about twice an hour. Diet was advanced to clear liquids yesterday but patient reports that he only tried drinking water, has appetite today and agreeable to trying juices/more advanced fluids. He reports passing gas. Tolerating hemodialysis well this morning.  Objective: Vital signs in last 24 hours: Vitals:   01/01/16 0551 01/01/16 0825 01/01/16 0827 01/01/16 0900  BP: (!) 143/91 (!) 174/96 (!) 177/94 (!) 159/83  Pulse: 91 96 93 (!) 108  Resp: 14 16 17 16   Temp: 99.8 F (37.7 C) 98.5 F (36.9 C)    TempSrc: Oral Oral    SpO2: 96% 97%    Weight:  69.2 kg (152 lb 8.9 oz)    Height:        Intake/Output Summary (Last 24 hours) at 01/01/16 0924 Last data filed at 01/01/16 0644  Gross per 24 hour  Intake           557.27 ml  Output                0 ml  Net           557.27 ml   Physical Exam General appearance: Thin, tired-appearing gentleman resting in bed, mumbles short responses HENT: Normocephalic, atraumatic Cardiovascular: Normal rate and rhythm, no murmurs, rubs, gallops Respiratory/Chest: Clear to ausculation bilaterally, mildly tachypneic Abdomen: Bowel sounds present, abdominal binder in place, visible underlying bandage clean/dry/intact, moderately tender to palpation  Skin: Warm, dry, intact Psych: Appropriate affect  Labs / Imaging / Procedures: CBC Latest Ref Rng & Units 01/01/2016 12/31/2015 12/30/2015  WBC 4.0 - 10.5 K/uL 6.5 5.2 4.8  Hemoglobin 13.0 - 17.0 g/dL 8.8(L) 8.0(L) 7.1(L)  Hematocrit 39.0 - 52.0 % 27.2(L) 24.8(L) 22.4(L)  Platelets 150 - 400 K/uL 83(L) 67(L) 79(L)   BMP Latest Ref Rng & Units 01/01/2016 12/31/2015 12/30/2015  Glucose 65 - 99 mg/dL 65 76 75  BUN 6 - 20 mg/dL 52(H) 39(H) 80(H)  Creatinine 0.61 - 1.24 mg/dL 12.03(H) 9.43(H) 14.43(H)  Sodium 135 - 145 mmol/L 132(L) 132(L) 137  Potassium 3.5 - 5.1 mmol/L 4.5 4.4 6.2(H)    Chloride 101 - 111 mmol/L 96(L) 95(L) 98(L)  CO2 22 - 32 mmol/L 24 26 21(L)  Calcium 8.9 - 10.3 mg/dL 8.5(L) 8.4(L) 8.6(L)   No results found.  Assessment/Plan: KERRIE LATOUR is a 35 y.o. gentleman with PMH AIDS, ESRD on HD TTS, ACD  admitted for acute abdominal, found to have ileocecal intussusception and taken to OR for resection.   Ileocecal intussusception, with infarcted appendix, s/p resection and anastomosis on 10/31, now POD#3 with resolved post-op bleeding. Hb now 8.0 from 7.1 yesterday evening, oozing blood from surgical incision has stopped - Surgery following and appreciate recs - Tolerating water, advancing diet to full liquid - Dilaudid PCA for pain control, consider wean to PRN IV Q1-2 today or tomorrow - Daily CBCs  Acute blood loss anemia, secondary to surgery in setting of hyperuremia, resolving, Hb 8.0 this AM from 7.1 previous evening, s/p 1 u pRBCs overnight - Daily CBCs  AIDS, last CD 30 in September, elevated viral load, previous difficulty with insurance, refusing to take ART or ppx without food - Advancing diet - ART - Dapsone for PCP ppx - Azithro 1200mg  weekly for MAC ppx  ESRD on HD, s/p HD on 11/2, 11/4 - Daily BMPs, follow renal recs  Hypertension, bp 177/94 this AM despite restarted Hydralazine  and half dose Losartan - Hydralazine 100 TID - Resume Losartan 100 mg daily, home dose - Reassess following HD - Hold home Dilt XL  Seizure disorder - Continue home keppra.  Dispo: Anticipated discharge in approximately 2-4 day(s).    LOS: 4 days   Asencion Partridge, MD 01/01/2016, 9:24 AM Pager: [phone]

## 2016-01-01 NOTE — Progress Notes (Signed)
OT Cancellation Note  Patient Details Name: JARAN SAINZ MRN: [id number] DOB: [birth year]   Cancelled Treatment:    Reason Eval/Treat Not Completed: Patient at procedure or test/ unavailable;Other (comment) (Pt at hemodialysis.)   Merri Ray Nikaela Coyne 01/01/2016, 1:08 PM  Hulda Humphrey OTR/L [phone]

## 2016-01-01 NOTE — Progress Notes (Signed)
Patient explained and encouraged to use his incentive but continues to refuse.

## 2016-01-02 ENCOUNTER — Other Ambulatory Visit: Payer: Self-pay | Admitting: Pharmacist Clinician (PhC)/ Clinical Pharmacy Specialist

## 2016-01-02 LAB — BASIC METABOLIC PANEL
Anion gap: 11 (ref 5–15)
BUN: 32 mg/dL — ABNORMAL HIGH (ref 6–20)
CO2: 26 mmol/L (ref 22–32)
Calcium: 8.7 mg/dL — ABNORMAL LOW (ref 8.9–10.3)
Chloride: 97 mmol/L — ABNORMAL LOW (ref 101–111)
Creatinine, Ser: 8.79 mg/dL — ABNORMAL HIGH (ref 0.61–1.24)
GFR calc Af Amer: 8 mL/min — ABNORMAL LOW (ref >60)
GFR calc non Af Amer: 7 mL/min — ABNORMAL LOW (ref >60)
Glucose, Bld: 81 mg/dL (ref 65–99)
Potassium: 4 mmol/L (ref 3.5–5.1)
Sodium: 134 mmol/L — ABNORMAL LOW (ref 135–145)

## 2016-01-02 LAB — CBC
HCT: 27.5 % — ABNORMAL LOW (ref 39.0–52.0)
Hemoglobin: 9 g/dL — ABNORMAL LOW (ref 13.0–17.0)
MCH: 29.5 pg (ref 26.0–34.0)
MCHC: 32.7 g/dL (ref 30.0–36.0)
MCV: 90.2 fL (ref 78.0–100.0)
Platelets: 99 10*3/uL — ABNORMAL LOW (ref 150–400)
RBC: 3.05 MIL/uL — ABNORMAL LOW (ref 4.22–5.81)
RDW: 15.6 % — ABNORMAL HIGH (ref 11.5–15.5)
WBC: 4.6 10*3/uL (ref 4.0–10.5)

## 2016-01-02 MED ORDER — HYDROCODONE-ACETAMINOPHEN 5-325 MG PO TABS
1.0000 | ORAL_TABLET | ORAL | Status: DC | PRN
Start: 2016-01-02 — End: 2016-01-03
  Administered 2016-01-02: 2 via ORAL
  Administered 2016-01-02: 1 via ORAL
  Administered 2016-01-03: 2 via ORAL
  Filled 2016-01-02: qty 1
  Filled 2016-01-02 (×2): qty 2

## 2016-01-02 MED ORDER — HYDROMORPHONE HCL 1 MG/ML IJ SOLN: 0.5000 mg | INTRAMUSCULAR | Status: DC | PRN

## 2016-01-02 NOTE — Progress Notes (Signed)
Pt refused incentive spirometer, elavil, colace, low grade temp, encouraged to ambulate but refused, will continue to monitor.

## 2016-01-02 NOTE — Progress Notes (Signed)
5 Days Post-Op  Subjective: Denies n/v. Reports flatus. Doesn't like the liquids but drank them. Denies burping.  Not really parti in therapies. States he doesn't understand why has to do IS. It hurts when he does it 1 low grade temp  Objective: Vital signs in last 24 hours: Temp:  [98.6 F (37 C)-100.5 F (38.1 C)] 99.3 F (37.4 C) (11/05 1027) Pulse Rate:  [96-111] 96 (11/05 1027) Resp:  [12-19] 14 (11/05 1027) BP: (112-157)/(54-98) 141/72 (11/05 1027) SpO2:  [93 %-99 %] 96 % (11/05 1027) Weight:  [65.7 kg (144 lb 13.5 oz)] 65.7 kg (144 lb 13.5 oz) (11/04 1214) Last BM Date: 12/28/15  Intake/Output from previous day: 11/04 0701 - 11/05 0700 In: 488.8 [P.O.:460; I.V.:28.8] Out: 3500  Intake/Output this shift: No intake/output data recorded.  Alert, nad cta Mild tachy Soft, nd, +BS, open midline incision - some fibrinous exudate. Fascia intact  Lab Results:   Recent Labs  01/01/16 0350 01/02/16 0423  WBC 6.5 4.6  HGB 8.8* 9.0*  HCT 27.2* 27.5*  PLT 83* 99*   BMET  Recent Labs  01/01/16 0350 01/02/16 0423  NA 132* 134*  K 4.5 4.0  CL 96* 97*  CO2 24 26  GLUCOSE 65 81  BUN 52* 32*  CREATININE 12.03* 8.79*  CALCIUM 8.5* 8.7*   PT/INR No results for input(s): LABPROT, INR in the last 72 hours. ABG No results for input(s): PHART, HCO3 in the last 72 hours.  Invalid input(s): PCO2, PO2  Studies/Results: Dg Chest Port 1 View  Result Date: 01/01/2016 CLINICAL DATA:  Acute onset of fever and tachypnea. Initial encounter. EXAM: PORTABLE CHEST 1 VIEW COMPARISON:  Chest radiograph performed 12/28/2015 FINDINGS: The lungs are well-aerated. Minimal bilateral opacities may reflect atelectasis or mild infection. There is no evidence of pleural effusion or pneumothorax. The cardiomediastinal silhouette is mildly enlarged. No acute osseous abnormalities are seen. IMPRESSION: Mild cardiomegaly. Minimal bilateral opacities may reflect atelectasis or mild infection.  Electronically Signed   By: Garald Balding M.D.   On: 01/01/2016 21:48    Anti-infectives: Anti-infectives    Start     Dose/Rate Route Frequency Ordered Stop   12/29/15 1600  ceFAZolin (ANCEF) IVPB 1 g/50 mL premix  Status:  Discontinued     1 g 100 mL/hr over 30 Minutes Intravenous Every 24 hours 12/28/15 2141 12/31/15 1621   12/29/15 1000  azithromycin (ZITHROMAX) tablet 1,200 mg     1,200 mg Oral Every Wed 12/28/15 1528     12/29/15 1000  dapsone tablet 100 mg     100 mg Oral Daily 12/28/15 1528     12/28/15 2200  abacavir (ZIAGEN) tablet 600 mg     600 mg Oral Daily with supper 12/28/15 1528     12/28/15 2200  darunavir-cobicistat (PREZCOBIX) 800-150 MG per tablet 1 tablet     1 tablet Oral Every evening 12/28/15 1528     12/28/15 2200  dolutegravir (TIVICAY) tablet 50 mg     50 mg Oral Every evening 12/28/15 1528     12/28/15 1930  metroNIDAZOLE (FLAGYL) IVPB 500 mg     500 mg 100 mL/hr over 60 Minutes Intravenous  Once 12/28/15 1917 12/28/15 2005   12/28/15 1545  ceFAZolin (ANCEF) IVPB 1 g/50 mL premix  Status:  Discontinued     1 g 100 mL/hr over 30 Minutes Intravenous Every 8 hours 12/28/15 1541 12/28/15 2140      Assessment/Plan: ileocolic intussusception which reduced during dissection, infarcted appendix Exploratory  Laparotomy, ileocecectomy 12/28/15, Dr. Romana Juniper  Adv diet to soft Can dc pca and start oral pain meds, IV for breakthru Cont local wound care Discussed importance of ambulation, rationale for IS Was able to pull 1400 on IS, encouraged him to do at least once per hr. Seemed very reluctant even after discussion  Cont pt/ot.  Await bowel movement before dc  Leighton Ruff. Redmond Pulling, MD, FACS General, Bariatric, & Minimally Invasive Surgery [facility], [place]   LOS: 5 days    Gayland Curry 01/02/2016

## 2016-01-02 NOTE — Progress Notes (Signed)
[facility] Progress Note   Subjective: "I'm always in pain".  Resting quietly, NAD. Still on dilaudid PCA for pain control. Says he has been OOB earlier but nursing note says he has refused.   Objective Vitals:   01/02/16 0400 01/02/16 0620 01/02/16 0623 01/02/16 0631  BP:  (!) 157/98    Pulse:  (!) 108    Resp: 16  16 16   Temp:  99.7 F (37.6 C)    TempSrc:      SpO2: 99% 94%  97%  Weight:      Height:       Physical Exam General: Quiet. NAD Heart: S1,S2, RRR Lungs: CTAB Abdomen: mid line incision, drsg CDI, abd binder in place. Active BS X 4 quads. No distention Extremities: No LE edema.  Dialysis Access: LFA AVF  [facility] 3h 66min 66.5kg 2/2 bath P4 LFA AVF Hep 5000 mircera 100 q 2, last 10/24 Calcitriol 0.75 tiw, last pth 975, P 7.6  Additional Objective Labs: Basic Metabolic Panel:  Recent Labs Lab 12/30/15 0326 12/31/15 0448 01/01/16 0350 01/01/16 1433 01/02/16 0423  NA 137 132* 132*  --  134*  K 6.2* 4.4 4.5  --  4.0  CL 98* 95* 96*  --  97*  CO2 21* 26 24  --  26  GLUCOSE 75 76 65  --  81  BUN 80* 39* 52*  --  32*  CREATININE 14.43* 9.43* 12.03*  --  8.79*  CALCIUM 8.6* 8.4* 8.5*  --  8.7*  PHOS 11.1*  --   --  7.0*  --    Liver Function Tests:  Recent Labs Lab 12/28/15 1011 12/29/15 0532 12/30/15 0326 01/01/16 1433  AST 56* 128*  --   --   ALT 22 37  --   --   ALKPHOS 41 38  --   --   BILITOT 1.0 0.8  --   --   PROT 8.0 6.5  --   --   ALBUMIN 3.0* 2.3* 2.6* 2.4*    Recent Labs Lab 12/28/15 1011  LIPASE 43   CBC:  Recent Labs Lab 12/30/15 1416 12/30/15 1810 12/31/15 0448 01/01/16 0350 01/02/16 0423  WBC 4.9 4.8 5.2 6.5 4.6  HGB 7.3* 7.1* 8.0* 8.8* 9.0*  HCT 22.6* 22.4* 24.8* 27.2* 27.5*  MCV 90.4 90.7 90.5 90.1 90.2  PLT 79* 79* 67* 83* 99*   Blood Culture    Component Value Date/Time   SDES EAR RIGHT 11/23/2015 1638   SPECREQUEST NONE 11/23/2015 1638   CULT  11/23/2015 1638    RARE METHICILLIN RESISTANT STAPHYLOCOCCUS AUREUS RARE STAPHYLOCOCCUS LUGDUNENSIS    REPTSTATUS 11/27/2015 FINAL 11/23/2015 1638    Cardiac Enzymes:  Recent Labs Lab 12/28/15 1036  TROPONINI 0.05*   CBG:  Recent Labs Lab 12/29/15 0838 12/31/15 0733 01/01/16 0734  GLUCAP 109* 81 71   Iron Studies: No results for input(s): IRON, TIBC, TRANSFERRIN, FERRITIN in the last 72 hours. @lablastinr3 @ Studies/Results: Dg Chest Port 1 View  Result Date: 01/01/2016 CLINICAL DATA:  Acute onset of fever and tachypnea. Initial encounter. EXAM: PORTABLE CHEST 1 VIEW COMPARISON:  Chest radiograph performed 12/28/2015 FINDINGS: The lungs are well-aerated. Minimal bilateral opacities may reflect atelectasis or mild infection. There is no evidence of pleural effusion or pneumothorax. The cardiomediastinal silhouette is mildly enlarged. No acute osseous abnormalities are seen. IMPRESSION: Mild cardiomegaly. Minimal bilateral opacities may reflect atelectasis or mild infection. Electronically Signed   By: Garald Balding M.D.   On: 01/01/2016  21:48   Medications: . sodium chloride 1 mL (12/31/15 1523)   . abacavir  600 mg Oral Q supper  . amitriptyline  12.5 mg Oral QHS  . azithromycin  1,200 mg Oral Q Wed  . carvedilol  12.5 mg Oral BID WC  . Chlorhexidine Gluconate Cloth  6 each Topical Q0600  . dapsone  100 mg Oral Daily  . darunavir-cobicistat  1 tablet Oral QPM  . docusate sodium  100 mg Oral BID  . dolutegravir  50 mg Oral QPM  . hydrALAZINE  100 mg Oral Q8H  . HYDROmorphone   Intravenous Q4H  . levETIRAcetam  250 mg Oral Once per day on Tue Thu Sat  . levETIRAcetam  500 mg Oral Once per day on Sun Mon Wed Fri  . losartan  100 mg Oral Daily  . multivitamin  1 tablet Oral QHS  . mupirocin ointment  1 application Nasal BID  . sodium chloride flush  3 mL Intravenous Q12H  . sucroferric oxyhydroxide  500 mg Oral TID WC     Assessment/Plan: 1. SP ileocecectomy for intussusception: Post  op day 4. On PCA dilaudid. Taking clear liquids. No Nausea. No return of bowel function yet.  Per primary. Noncompliant with ambulation, IS post op.  2. ESRD -[facility]. HD 01/01/16. Next tx 01/04/16. No heparin. K+ 4.0 3. Anemia -HGB ^ 9.0. ESA due 11/07. Follow HGB.  4. Secondary hyperparathyroidism - Cont binders/VDRA. Ca 8.7 Phos 7.0  5. HTN/volume -HD 01/01/16  Pre Wt 69.2 UFG 4000 Net UF 3500 post wt 65.7 kg. Now under OP EDW. Consider lower EDW/may need to increase coreg. Continue coreg, hydralazine and losartan.  6. Nutrition - Albumin 2.6-recheck today. clear liq diet.Add nepro when diet advanced. Renal vit  7. HIV/AIDS: Per primary.  8. Otitis externa:+MRSA and staph lugdunensisTreated with Vanc.    Rita H. Brown NP-C 01/02/2016, 9:38 AM  [facility] [phone]  Pt seen, examined and agree w A/P as above. Encouraged him to try to get off the PCA as that may improve his ileus.  He didn't respond very much to that idea.  Kelly Splinter MD [employer] pager [phone]   01/02/2016, 12:02 PM

## 2016-01-02 NOTE — Progress Notes (Signed)
Medicine attending: I personally examined this patient today. He continues to improve. Increasing ability to tolerate clear liquids and will advance to soft solids. Low-grade temperature up to 100.5 over the last 24 hours. Lungs are clear. Regular cardiac rhythm no murmur. Bowel sounds are present. He is passing gas but has not yet had a bowel movement. No calf tenderness.  Pain requirements are decreasing. We will discontinue the parenteral narcotics. Use when necessary pain medication. Pathology on the ileocecum ectomy specimen from October 31 shows benign inflammatory changes and some ischemic changes but no malignancy. Continue current plan.

## 2016-01-02 NOTE — Progress Notes (Signed)
Subjective: Jim Wyatt is feeling okay this morning. Reports pain improving. Had been drinking liquid diet without pain or nausea. Interested in advancing diet to solid food. Passing gas but not bowel movement yet. Discussed plan to advance diet, transition off PCA pump, and the need to work with PT/OT and use his incentive spirometer. Patient voiced agreement.   Objective: Vital signs in last 24 hours: Vitals:   01/02/16 0620 01/02/16 0623 01/02/16 0631 01/02/16 1027  BP: (!) 157/98   (!) 141/72  Pulse: (!) 108   96  Resp:  16 16 14   Temp: 99.7 F (37.6 C)   99.3 F (37.4 C)  TempSrc:    Oral  SpO2: 94%  97% 96%  Weight:      Height:        Intake/Output Summary (Last 24 hours) at 01/02/16 1207 Last data filed at 01/02/16 0631  Gross per 24 hour  Intake           488.79 ml  Output             3500 ml  Net         -3011.21 ml   Physical Exam General appearance: Thin, tired-appearing gentleman resting in bed, mumbles short responses HENT: Normocephalic, atraumatic Cardiovascular: Normal rate and rhythm, no murmurs, rubs, gallops Respiratory/Chest: Clear to ausculation bilaterally, normal work of breathing Abdomen: Bowel sounds present, abdominal binder in place, visible underlying bandage clean/dry/intact, moderately tender to palpation  Skin: Warm, dry, intact Psych: Blunt affect  Labs / Imaging / Procedures: CBC Latest Ref Rng & Units 01/02/2016 01/01/2016 12/31/2015  WBC 4.0 - 10.5 K/uL 4.6 6.5 5.2  Hemoglobin 13.0 - 17.0 g/dL 9.0(L) 8.8(L) 8.0(L)  Hematocrit 39.0 - 52.0 % 27.5(L) 27.2(L) 24.8(L)  Platelets 150 - 400 K/uL 99(L) 83(L) 67(L)   BMP Latest Ref Rng & Units 01/02/2016 01/01/2016 12/31/2015  Glucose 65 - 99 mg/dL 81 65 76  BUN 6 - 20 mg/dL 32(H) 52(H) 39(H)  Creatinine 0.61 - 1.24 mg/dL 8.79(H) 12.03(H) 9.43(H)  Sodium 135 - 145 mmol/L 134(L) 132(L) 132(L)  Potassium 3.5 - 5.1 mmol/L 4.0 4.5 4.4  Chloride 101 - 111 mmol/L 97(L) 96(L) 95(L)  CO2 22 - 32  mmol/L 26 24 26   Calcium 8.9 - 10.3 mg/dL 8.7(L) 8.5(L) 8.4(L)   Dg Chest Port 1 View  Result Date: 01/01/2016 CLINICAL DATA:  Acute onset of fever and tachypnea. Initial encounter. EXAM: PORTABLE CHEST 1 VIEW COMPARISON:  Chest radiograph performed 12/28/2015 FINDINGS: The lungs are well-aerated. Minimal bilateral opacities may reflect atelectasis or mild infection. There is no evidence of pleural effusion or pneumothorax. The cardiomediastinal silhouette is mildly enlarged. No acute osseous abnormalities are seen. IMPRESSION: Mild cardiomegaly. Minimal bilateral opacities may reflect atelectasis or mild infection. Electronically Signed   By: Garald Balding M.D.   On: 01/01/2016 21:48    Assessment/Plan: Jim Wyatt is a 35 y.o. gentleman with PMH AIDS, ESRD on HD TTS, ACD  admitted for acute abdominal, found to have ileocecal intussusception and taken to OR for resection.   Ileocecal intussusception, with infarcted appendix, s/p resection and anastomosis on 10/31, now POD#5 with resolved post-op bleeding. Hb stable/increasing, oozing blood from surgical incision has stopped, surgical path shows no malignant source - Surgery following and appreciate recs - Tolerating full liquid diet, advance to soft solid diet - Dc'd PCA and start Norco 5-325 po Q4PRN and Dilaudid 0.5 IV Q2PRN for breakthrough - Daily CBCs  AIDS, last CD 30 in  September, elevated viral load, previous difficulty with insurance, refusing to take ART or ppx without food - Acquiring updated CD4 and viral load - Advancing diet - ART - Dapsone for PCP ppx - Azithro 1200mg  weekly for MAC ppx  ESRD on HD, s/p HD on 11/2, 11/4 - Daily BMPs, follow renal recs  Hypertension, bp 177/94 this AM despite restarted Hydralazine and half dose Losartan - Hydralazine 100 TID - Resume Losartan 100 mg daily, home dose - Reassess following HD - Hold home Dilt XL  Seizure disorder - Continue home keppra.  Dispo: Anticipated  discharge in approximately 2-4 day(s).    LOS: 5 days   Asencion Partridge, MD 01/02/2016, 12:07 PM Pager: [phone]

## 2016-01-02 NOTE — Evaluation (Signed)
Occupational Therapy Evaluation Patient Details Name: Jim Wyatt MRN: [id number] DOB: 1980-11-17 Today's Date: 01/02/2016    History of Present Illness Pt is a 35 y/o male s/p ex lap with bowel resection secondary to intussusception. PMH including but not limited to HIV/AIDS, ESRD and HTN.   Clinical Impression   PTA Pt independent in ADL and mobility. Pt currently min guard for seated ADL and mobility, and requires greatly increased time due to fatigue and limited energy. Pt will benefit from skilled OT in the acute care setting prior to d/c home to work on energy conservation skills and increased stamina for ADL to maximize independence.    Follow Up Recommendations  Supervision/Assistance - 24 hour;No OT follow up    Equipment Recommendations  None recommended by OT    Recommendations for Other Services       Precautions / Restrictions Precautions Precautions: Fall Required Braces or Orthoses: Other Brace/Splint Other Brace/Splint: abdominal binder Restrictions Weight Bearing Restrictions: No      Mobility Bed Mobility Overal bed mobility: Modified Independent Bed Mobility: Supine to Sit;Sit to Supine           General bed mobility comments: Pt required vastly more time than usual.   Transfers Overall transfer level: Needs assistance Equipment used: 1 person hand held assist Transfers: Sit to/from Stand Sit to Stand: Min assist         General transfer comment: Pt used bed rail and stood x 1, and sat immediately down again. Pt with 1 HHA to stand and stay up on 2nd attempt    Balance Overall balance assessment: Needs assistance Sitting-balance support: No upper extremity supported;Feet supported Sitting balance-Leahy Scale: Fair     Standing balance support: Single extremity supported;During functional activity Standing balance-Leahy Scale: Poor Standing balance comment: 1 HHA for balance, not able to stand at sink                             ADL Overall ADL's : Needs assistance/impaired     Grooming: Oral care;Wash/dry face;Supervision/safety;Sitting Grooming Details (indicate cue type and reason): unable to maintain standing at sink             Lower Body Dressing: Bed level;Min guard Lower Body Dressing Details (indicate cue type and reason): increased time required Toilet Transfer: Supervision/safety;Ambulation;BSC (in bathroom)   Toileting- Clothing Manipulation and Hygiene: Supervision/safety       Functional mobility during ADLs: Min guard (1 HHA 50% of the time for balance) General ADL Comments: Pt with very low energy. Pt able to don/doff socks, perform ADL seated at sink (due to energy) And Pt complaining of light headedness when walking     Vision     Perception     Praxis      Pertinent Vitals/Pain Pain Assessment: 0-10 Pain Score: 9  Pain Location: abdomen. incision site Pain Descriptors / Indicators: Throbbing;Sharp;Grimacing Pain Intervention(s): Limited activity within patient's tolerance;Monitored during session;Premedicated before session;Repositioned     Hand Dominance Right   Extremity/Trunk Assessment Upper Extremity Assessment Upper Extremity Assessment: Overall WFL for tasks assessed;Generalized weakness   Lower Extremity Assessment Lower Extremity Assessment: Overall WFL for tasks assessed;Generalized weakness   Cervical / Trunk Assessment Cervical / Trunk Assessment: Normal   Communication Communication Communication: No difficulties   Cognition Arousal/Alertness: Lethargic Behavior During Therapy: Flat affect;WFL for tasks assessed/performed Overall Cognitive Status: Within Functional Limits for tasks assessed  General Comments       Exercises       Shoulder Instructions      Home Living Family/patient expects to be discharged to:: Private residence Living Arrangements: Non-relatives/Friends Available Help at Discharge:  Friend(s);Available PRN/intermittently Type of Home: House Home Access: Level entry     Home Layout: One level     Bathroom Shower/Tub: Tub/shower unit Shower/tub characteristics: Architectural technologist: Standard Bathroom Accessibility: Yes How Accessible: Accessible via walker Home Equipment: Walker - 2 wheels;Crutches   Additional Comments: big [place] sheppard at home      Prior Functioning/Environment Level of Independence: Independent        Comments: driving, 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,         OT Problem List: Decreased strength;Decreased activity tolerance;Impaired balance (sitting and/or standing);Pain   OT Treatment/Interventions: Self-care/ADL training;Therapeutic exercise;Energy conservation;Therapeutic activities;Patient/family education;Balance training    OT Goals(Current goals can be found in the care plan section) Acute Rehab OT Goals Patient Stated Goal: be without pain OT Goal Formulation: With patient Time For Goal Achievement: 01/09/16 Potential to Achieve Goals: Good ADL Goals Pt Will Perform Grooming: with modified independence;standing (for a 5 min period with no sitting breaks) Pt Will Transfer to Toilet: with modified independence;ambulating;regular height toilet Additional ADL Goal #1: Pt will recall and demonstrate 2 ways to conserve energy during an OT session  OT Frequency: Min 3X/week   Barriers to D/C: Decreased caregiver support  Pt lives with roomate who works and has girlfriend, but not able to provide 24 hour care       Co-evaluation              End of Session Equipment Utilized During Treatment: Gait belt Nurse Communication: Mobility status;Other (comment) (on her way in room with pain meds)  Activity Tolerance: Patient limited by fatigue Patient left: in bed;with call bell/phone within reach;with family/visitor present   Time: 1829-9371 OT Time Calculation (min): 72 min Charges:  OT General  Charges $OT Visit: 1 Procedure OT Evaluation $OT Eval Low Complexity: 1 Procedure OT Treatments $Self Care/Home Management : 23-37 mins $Therapeutic Activity: 23-37 mins G-Codes:    Merri Ray Hannahmarie Asberry 2016-01-10, 4:52 PM  Hulda Humphrey OTR/L [phone]

## 2016-01-02 NOTE — Progress Notes (Signed)
Patient needs encouragement with ambulation, refusing some of his medications, MD made aware

## 2016-01-03 ENCOUNTER — Ambulatory Visit: Payer: PRIVATE HEALTH INSURANCE

## 2016-01-03 LAB — BASIC METABOLIC PANEL WITH GFR
Anion gap: 10 (ref 5–15)
BUN: 51 mg/dL — ABNORMAL HIGH (ref 6–20)
CO2: 28 mmol/L (ref 22–32)
Calcium: 8.7 mg/dL — ABNORMAL LOW (ref 8.9–10.3)
Chloride: 95 mmol/L — ABNORMAL LOW (ref 101–111)
Creatinine, Ser: 11.44 mg/dL — ABNORMAL HIGH (ref 0.61–1.24)
GFR calc Af Amer: 6 mL/min — ABNORMAL LOW
GFR calc non Af Amer: 5 mL/min — ABNORMAL LOW
Glucose, Bld: 112 mg/dL — ABNORMAL HIGH (ref 65–99)
Potassium: 3.8 mmol/L (ref 3.5–5.1)
Sodium: 133 mmol/L — ABNORMAL LOW (ref 135–145)

## 2016-01-03 LAB — CBC
HCT: 25.6 % — ABNORMAL LOW (ref 39.0–52.0)
Hemoglobin: 8.3 g/dL — ABNORMAL LOW (ref 13.0–17.0)
MCH: 28.9 pg (ref 26.0–34.0)
MCHC: 32.4 g/dL (ref 30.0–36.0)
MCV: 89.2 fL (ref 78.0–100.0)
Platelets: 103 K/uL — ABNORMAL LOW (ref 150–400)
RBC: 2.87 MIL/uL — ABNORMAL LOW (ref 4.22–5.81)
RDW: 15.4 % (ref 11.5–15.5)
WBC: 4.5 K/uL (ref 4.0–10.5)

## 2016-01-03 MED ORDER — HYDROMORPHONE HCL 1 MG/ML IJ SOLN: 0.5000 mg | INTRAMUSCULAR | Status: DC | PRN

## 2016-01-03 MED ORDER — HYDROCODONE-ACETAMINOPHEN 5-325 MG PO TABS
1.0000 | ORAL_TABLET | Freq: Four times a day (QID) | ORAL | Status: DC | PRN
  Administered 2016-01-03 – 2016-01-04 (×2): 2 via ORAL
  Administered 2016-01-04: 1 via ORAL
  Filled 2016-01-03 (×3): qty 2

## 2016-01-03 MED ORDER — CALCIUM CARBONATE ANTACID 500 MG PO CHEW: 200.0000 mg | CHEWABLE_TABLET | Freq: Three times a day (TID) | ORAL | Status: DC

## 2016-01-03 MED ORDER — HEPARIN SODIUM (PORCINE) 5000 UNIT/ML IJ SOLN
5000.0000 [IU] | Freq: Three times a day (TID) | INTRAMUSCULAR | Status: DC
  Filled 2016-01-03 (×2): qty 1

## 2016-01-03 MED ORDER — DARBEPOETIN ALFA 100 MCG/0.5ML IJ SOSY
100.0000 ug | PREFILLED_SYRINGE | INTRAMUSCULAR | Status: DC
  Administered 2016-01-04: 100 ug via INTRAVENOUS
  Filled 2016-01-03: qty 0.5

## 2016-01-03 MED ORDER — AMITRIPTYLINE HCL 25 MG PO TABS
25.0000 mg | ORAL_TABLET | Freq: Every day | ORAL | Status: DC
  Administered 2016-01-03: 25 mg via ORAL
  Filled 2016-01-03: qty 1

## 2016-01-03 MED ORDER — DILTIAZEM HCL ER COATED BEADS 180 MG PO CP24
180.0000 mg | ORAL_CAPSULE | Freq: Every day | ORAL | Status: DC
  Administered 2016-01-03: 180 mg via ORAL
  Filled 2016-01-03: qty 1

## 2016-01-03 NOTE — Progress Notes (Signed)
Pt bladder scanned with 200cc noted. Pt says he will try to go to the bathroom to urinate as encouraged

## 2016-01-03 NOTE — Plan of Care (Signed)
Problem: Activity: Goal: Risk for activity intolerance will decrease Outcome: Not Progressing Pt very lethargic and refuses to get out of bed  Problem: Nutrition: Goal: Adequate nutrition will be maintained Outcome: Not Progressing Poor dietary intake

## 2016-01-03 NOTE — Progress Notes (Signed)
OT Cancellation Note  Patient Details Name: Jim Wyatt MRN: [id number] DOB: 01/10/1981   Cancelled Treatment:    Reason Eval/Treat Not Completed: Patient declined, no reason specified;Other (comment): Pt unwilling to open eyes to participate with therapist but did verbalize that he was "not going to move right now" and too tired to participate with therapy at this time. Continued to encourage pt to participate on multiple attempts, but pt continued to decline. Will continue to check back as able.  [address] OTR/L [phone] 01/03/2016, 12:25 PM

## 2016-01-03 NOTE — Care Management Note (Addendum)
Case Management Note  Patient Details  Name: CARLEE TESFAYE MRN: [id number] Date of Birth: April 09, 1980  Subjective/Objective:                    Action/Plan: [facility] was able to locate insurance information. Patient states he has BCBS , but does not have insurance card or information with him and cannot "get it" . Explained need it for records and to arrange home health . Patient consented to NCM giving [place] his social security number . Advanced trying to research insurance information.   Expected Discharge Date:                  Expected Discharge Plan:  [place]  In-House Referral:     Discharge planning Services  CM Consult  Post Acute Care Choice:  Home Health Choice offered to:  Patient  DME Arranged:    DME Agency:     HH Arranged:  RN, PT [place] Agency:  [place]  Status of Service:  In process, will continue to follow  If discussed at Long Length of Stay Meetings, dates discussed:    Additional Comments:  Marilu Favre, RN 01/03/2016, 11:00 AM

## 2016-01-03 NOTE — Progress Notes (Signed)
PT Cancellation Note  Patient Details Name: Jim Wyatt MRN: [id number] DOB: [birth year]   Cancelled Treatment:    Reason Eval/Treat Not Completed: Patient declined, no reason specified Pt declined, despite encouragement, due to being "too tired and in pain". Pt reported that he will request pain meds. PT will check on pt later as time allows.    [place] April, PTA Pager: [phone]   01/03/2016, 10:25 AM

## 2016-01-03 NOTE — Progress Notes (Signed)
Patient ID: Jim Wyatt, male   DOB: 06-05-1980, 35 y.o.   MRN: [id number]  [facility] Progress Note    Subjective:   Tired and has some abdominal pain   Objective:   BP (!) 147/98 (BP Location: Right Arm)   Pulse (!) 102   Temp 99.2 F (37.3 C) (Oral)   Resp 17   Ht 5\' 7"  (1.702 m)   Wt 65.7 kg (144 lb 13.5 oz) Comment: Bed Scale  SpO2 92%   BMI 22.69 kg/m   Intake/Output: I/O last 3 completed shifts: In: 739.5 [P.O.:720; I.V.:19.5] Out: 0    Intake/Output this shift:  No intake/output data recorded. Weight change:   Physical Exam: Gen:WD AAM in NAD3 CVS: no rub Resp:cta Abd: binder in place, +BS, tender Ext:no edema, LAVF +T/B  Labs: BMET  Recent Labs Lab 12/28/15 1011 12/29/15 0532 12/29/15 1217 12/30/15 0326 12/31/15 0448 01/01/16 0350 01/01/16 1433 01/02/16 0423 01/03/16 0143  NA 137 136 138 137 132* 132*  --  134* 133*  K 3.7 5.9* 6.0* 6.2* 4.4 4.5  --  4.0 3.8  CL 95* 98* 100* 98* 95* 96*  --  97* 95*  CO2 28 23 24  21* 26 24  --  26 28  GLUCOSE 98 122* 103* 75 76 65  --  81 112*  BUN 42* 52* 62* 80* 39* 52*  --  32* 51*  CREATININE 11.08* 12.76* 13.39* 14.43* 9.43* 12.03*  --  8.79* 11.44*  ALBUMIN 3.0* 2.3*  --  2.6*  --   --  2.4*  --   --   CALCIUM 8.4* 8.3* 8.2* 8.6* 8.4* 8.5*  --  8.7* 8.7*  PHOS  --   --   --  11.1*  --   --  7.0*  --   --    CBC  Recent Labs Lab 12/31/15 0448 01/01/16 0350 01/02/16 0423 01/03/16 0143  WBC 5.2 6.5 4.6 4.5  HGB 8.0* 8.8* 9.0* 8.3*  HCT 24.8* 27.2* 27.5* 25.6*  MCV 90.5 90.1 90.2 89.2  PLT 67* 83* 99* 103*    @IMGRELPRIORS @ Medications:    . abacavir  600 mg Oral Q supper  . amitriptyline  12.5 mg Oral QHS  . azithromycin  1,200 mg Oral Q Wed  . carvedilol  12.5 mg Oral BID WC  . dapsone  100 mg Oral Daily  . darunavir-cobicistat  1 tablet Oral QPM  . docusate sodium  100 mg Oral BID  . dolutegravir  50 mg Oral QPM  . hydrALAZINE  100 mg Oral Q8H  . levETIRAcetam   250 mg Oral Once per day on Tue Thu Sat  . levETIRAcetam  500 mg Oral Once per day on Sun Mon Wed Fri  . losartan  100 mg Oral Daily  . multivitamin  1 tablet Oral QHS  . sodium chloride flush  3 mL Intravenous Q12H  . sucroferric oxyhydroxide  500 mg Oral TID WC   [facility] 3h 44min 66.5kg 2/2 bath P4 LFA AVF Hep 5000 mircera 100 q 2, last 10/24 Calcitriol 0.75 tiw, last pth 975, P 7.6  Assessment/ Plan:   1. Intussusception- s/p ileocecectomy post op day 5 per primary 2. ESRD- continue with TTS schedule while he remains an inpatient 3. Anemia: on micera as an outpatient.  aranesp here with HD tomorrow 4. CKD-MBD: cont binders  5. Nutrition:renal diet 6. Hypertension:stable 7. Disposition- pt is poorly motivated to work with PT/OT.   Donetta Potts,  MD [place] Pager [phone] 01/03/2016, 10:39 AM

## 2016-01-03 NOTE — Discharge Instructions (Signed)
Dressing Change Pack wound wet to dry twice and day till healed. A dressing is a material placed over wounds. It keeps the wound clean, dry, and protected from further injury.  BEFORE YOU BEGIN  Get your supplies together. Things you may need include:  Salt solution (saline).  4 x 4's  Tape.  Gloves.  Belly (abdominal) pads.  Gauze squares.  Plastic bags.  Take pain medicine 30 minutes before the bandage change if you need it.  Take a shower before you do the first bandage change of the day. Put plastic wrap or a bag over the dressing. REMOVING YOUR OLD BANDAGE  Wash your hands with soap and water. Dry your hands with a clean towel.  Put on your gloves.  Remove any tape.  Remove the old bandage as told. If it sticks, put a small amount of warm water on it to loosen the bandage.  Remove any gauze or packing tape in your wound.  Take off your gloves.  Put the gloves, tape, gauze, or any packing tape in a plastic bag. CHANGING YOUR BANDAGE  Open the supplies.  Take the cap off the salt solution.  Open the gauze. Leave the gauze on the inside of the package.  Put on your gloves.  Clean your wound as told by your doctor.  Keep your wound dry if your doctor told you to do so.  Your doctor may tell you to do one or more of the following:  Pick up the gauze. Pour the salt solution over the gauze. Squeeze out the extra salt solution.  Put medicated cream or other medicine on your wound.  Put solution soaked gauze only in your wound, not on the skin around it.  Pack your wound loosely.  Put dry gauze on your wound.  Put belly pads over the dry gauze if your bandages soak through.  Tape the bandage in place so it will not fall off. Do not wrap the tape all the way around your arm or leg.  Wrap the bandage with the flexible gauze bandage as told by your doctor.  Take off your gloves. Put them in the plastic bag with the old bandage. Tie the bag shut and  throw it away.  Keep the bandage clean and dry.  Wash your hands. GET HELP RIGHT AWAY IF:   Your skin around the wound looks red.  Your wound feels more tender or sore.  You see yellowish-white fluid (pus) in the wound.  Your wound smells bad.  You have a fever.  Your skin around the wound has a red rash that itches and burns.  You see black or yellow skin in your wound that was not there before.  You feel sick to your stomach (nauseous), throw up (vomit), and feel very tired.   This information is not intended to replace advice given to you by your health care provider. Make sure you discuss any questions you have with your health care provider.   Document Released: 05/12/2008 Document Revised: 03/06/2014 Document Reviewed: 12/25/2010 Elsevier Interactive Patient Education 2016 [facility], [place] [id number]  OPEN ABDOMINAL SURGERY: POST OP INSTRUCTIONS  Always review your discharge instruction sheet given to you by the facility where your surgery was performed.  IF YOU HAVE DISABILITY OR FAMILY LEAVE FORMS, YOU MUST BRING THEM TO THE OFFICE FOR PROCESSING.  PLEASE DO NOT GIVE THEM TO YOUR DOCTOR.  1. A prescription for pain medication  may be given to you upon discharge.  Take your pain medication as prescribed, if needed.  If narcotic pain medicine is not needed, then you may take acetaminophen (Tylenol) or ibuprofen (Advil) as needed. 2. Take your usually prescribed medications unless otherwise directed. 3. If you need a refill on your pain medication, please contact your pharmacy. They will contact our office to request authorization.  Prescriptions will not be filled after 5pm or on week-ends. 4. You should follow a light diet the first few days after arrival home, such as soup and crackers, pudding, etc.unless your doctor has advised otherwise. A high-fiber, low fat diet can be resumed as tolerated.   Be sure to include lots of fluids  daily. Most patients will experience some swelling and bruising on the chest and neck area.  Ice packs will help.  Swelling and bruising can take several days to resolve 5. Most patients will experience some swelling and bruising in the area of the incision. Ice pack will help. Swelling and bruising can take several days to resolve..  6. It is common to experience some constipation if taking pain medication after surgery.  Increasing fluid intake and taking a stool softener will usually help or prevent this problem from occurring.  A mild laxative (Milk of Magnesia or Miralax) should be taken according to package directions if there are no bowel movements after 48 hours. 7.  You may have steri-strips (small skin tapes) in place directly over the incision.  These strips should be left on the skin for 7-10 days.  If your surgeon used skin glue on the incision, you may shower in 24 hours.  The glue will flake off over the next 2-3 weeks.  Any sutures or staples will be removed at the office during your follow-up visit. You may find that a light gauze bandage over your incision may keep your staples from being rubbed or pulled. You may shower and replace the bandage daily. 8. ACTIVITIES:  You may resume regular (light) daily activities beginning the next day--such as daily self-care, walking, climbing stairs--gradually increasing activities as tolerated.  You may have sexual intercourse when it is comfortable.  Refrain from any heavy lifting or straining until approved by your doctor. a. You may drive when you no longer are taking prescription pain medication, you can comfortably wear a seatbelt, and you can safely maneuver your car and apply brakes b. Return to Work: ___________________________________ 62. You should see your doctor in the office for a follow-up appointment approximately two weeks after your surgery.  Make sure that you call for this appointment within a day or two after you arrive home to insure  a convenient appointment time. OTHER INSTRUCTIONS:  _____________________________________________________________ _____________________________________________________________  WHEN TO CALL YOUR DOCTOR: 1. Fever over 101.0 2. Inability to urinate 3. Nausea and/or vomiting 4. Extreme swelling or bruising 5. Continued bleeding from incision. 6. Increased pain, redness, or drainage from the incision. 7. Difficulty swallowing or breathing 8. Muscle cramping or spasms. 9. Numbness or tingling in hands or feet or around lips.  The clinic staff is available to answer your questions during regular business hours.  Please dont hesitate to call and ask to speak to one of the nurses if you have concerns.  For further questions, please visit [email or web address]

## 2016-01-03 NOTE — Progress Notes (Signed)
6 Days Post-Op  Subjective: Tolerating po Had BM yesterday  Objective: Vital signs in last 24 hours: Temp:  [99 F (37.2 C)-100 F (37.8 C)] 99.2 F (37.3 C) (11/06 0602) Pulse Rate:  [96-103] 102 (11/06 0602) Resp:  [14-17] 17 (11/06 0602) BP: (134-147)/(72-98) 147/98 (11/06 0602) SpO2:  [92 %-96 %] 92 % (11/06 0602) Last BM Date: 12/28/15  Intake/Output from previous day: 11/05 0701 - 11/06 0700 In: 360 [P.O.:360] Out: -  Intake/Output this shift: No intake/output data recorded.  Exam: Abdomen soft, non distended. Open wound clean  Lab Results:   Recent Labs  01/02/16 0423 01/03/16 0143  WBC 4.6 4.5  HGB 9.0* 8.3*  HCT 27.5* 25.6*  PLT 99* 103*   BMET  Recent Labs  01/02/16 0423 01/03/16 0143  NA 134* 133*  K 4.0 3.8  CL 97* 95*  CO2 26 28  GLUCOSE 81 112*  BUN 32* 51*  CREATININE 8.79* 11.44*  CALCIUM 8.7* 8.7*   PT/INR No results for input(s): LABPROT, INR in the last 72 hours. ABG No results for input(s): PHART, HCO3 in the last 72 hours.  Invalid input(s): PCO2, PO2  Studies/Results: Dg Chest Port 1 View  Result Date: 01/01/2016 CLINICAL DATA:  Acute onset of fever and tachypnea. Initial encounter. EXAM: PORTABLE CHEST 1 VIEW COMPARISON:  Chest radiograph performed 12/28/2015 FINDINGS: The lungs are well-aerated. Minimal bilateral opacities may reflect atelectasis or mild infection. There is no evidence of pleural effusion or pneumothorax. The cardiomediastinal silhouette is mildly enlarged. No acute osseous abnormalities are seen. IMPRESSION: Mild cardiomegaly. Minimal bilateral opacities may reflect atelectasis or mild infection. Electronically Signed   By: Garald Balding M.D.   On: 01/01/2016 21:48    Anti-infectives: Anti-infectives    Start     Dose/Rate Route Frequency Ordered Stop   12/29/15 1600  ceFAZolin (ANCEF) IVPB 1 g/50 mL premix  Status:  Discontinued     1 g 100 mL/hr over 30 Minutes Intravenous Every 24 hours 12/28/15 2141  12/31/15 1621   12/29/15 1000  azithromycin (ZITHROMAX) tablet 1,200 mg     1,200 mg Oral Every Wed 12/28/15 1528     12/29/15 1000  dapsone tablet 100 mg     100 mg Oral Daily 12/28/15 1528     12/28/15 2200  abacavir (ZIAGEN) tablet 600 mg     600 mg Oral Daily with supper 12/28/15 1528     12/28/15 2200  darunavir-cobicistat (PREZCOBIX) 800-150 MG per tablet 1 tablet     1 tablet Oral Every evening 12/28/15 1528     12/28/15 2200  dolutegravir (TIVICAY) tablet 50 mg     50 mg Oral Every evening 12/28/15 1528     12/28/15 1930  metroNIDAZOLE (FLAGYL) IVPB 500 mg     500 mg 100 mL/hr over 60 Minutes Intravenous  Once 12/28/15 1917 12/28/15 2005   12/28/15 1545  ceFAZolin (ANCEF) IVPB 1 g/50 mL premix  Status:  Discontinued     1 g 100 mL/hr over 30 Minutes Intravenous Every 8 hours 12/28/15 1541 12/28/15 2140      Assessment/Plan: s/p Procedure(s): EXPLORATORY LAPAROTOMY BOWEL RESECTION ILEOCECECTOMY (N/A)  Continue wound care On soft diet.  Ween off oral pain meds From a general surg standpoint, ready for d/c as early as tomorrow.  Will need home health for wound care   LOS: 6 days    Vandy Fong A 01/03/2016

## 2016-01-03 NOTE — Progress Notes (Signed)
Attempts to bladder scan as ordered met by excuse after excuse. Will keep trying to bladder scan 

## 2016-01-03 NOTE — Progress Notes (Signed)
Subjective: Tolerating soft solid diet well, still refusing some of his medications. Reports having a BM yesterday. PCA stopped yesterday and patient only required po Norco 3 times for pain control (no IV breakthrough used). Also worked with OT yesterday. Jim Wyatt is irritable today and reports ongoing fatigue, states that he is trying to sleep during the day and is typically up at night for work.   Objective: Vital signs in last 24 hours: Vitals:   01/02/16 1027 01/02/16 1405 01/02/16 2216 01/03/16 0602  BP: (!) 141/72 (!) 147/84 134/83 (!) 147/98  Pulse: 96 (!) 103 99 (!) 102  Resp: 14 16 16 17   Temp: 99.3 F (37.4 C) 100 F (37.8 C) 99 F (37.2 C) 99.2 F (37.3 C)  TempSrc: Oral Oral Oral Oral  SpO2: 96% 93% 95% 92%  Weight:      Height:        Intake/Output Summary (Last 24 hours) at 01/03/16 4540 Last data filed at 01/02/16 1700  Gross per 24 hour  Intake              360 ml  Output                0 ml  Net              360 ml   Physical Exam General appearance: Thin, tired-appearing gentleman resting in bed, mumbles short responses HENT: Normocephalic, atraumatic Cardiovascular: Normal rate and rhythm, no murmurs, rubs, gallops Respiratory/Chest: Clear to ausculation bilaterally, normal work of breathing Abdomen: Bowel sounds present, abdominal binder in place, visible underlying bandage clean/dry/intact, moderately tender to palpation  Skin: Warm, dry, intact Psych: Blunt affect  Labs / Imaging / Procedures: CBC Latest Ref Rng & Units 01/03/2016 01/02/2016 01/01/2016  WBC 4.0 - 10.5 K/uL 4.5 4.6 6.5  Hemoglobin 13.0 - 17.0 g/dL 8.3(L) 9.0(L) 8.8(L)  Hematocrit 39.0 - 52.0 % 25.6(L) 27.5(L) 27.2(L)  Platelets 150 - 400 K/uL 103(L) 99(L) 83(L)   BMP Latest Ref Rng & Units 01/03/2016 01/02/2016 01/01/2016  Glucose 65 - 99 mg/dL 112(H) 81 65  BUN 6 - 20 mg/dL 51(H) 32(H) 52(H)  Creatinine 0.61 - 1.24 mg/dL 11.44(H) 8.79(H) 12.03(H)  Sodium 135 - 145 mmol/L 133(L)  134(L) 132(L)  Potassium 3.5 - 5.1 mmol/L 3.8 4.0 4.5  Chloride 101 - 111 mmol/L 95(L) 97(L) 96(L)  CO2 22 - 32 mmol/L 28 26 24   Calcium 8.9 - 10.3 mg/dL 8.7(L) 8.7(L) 8.5(L)   No results found.  Assessment/Plan: Jim Wyatt is a 35 y.o. gentleman with PMH AIDS, ESRD on HD TTS, ACD  admitted for acute abdominal, found to have ileocecal intussusception and taken to OR for resection.   Ileocecal intussusception, with infarcted appendix, s/p resection and anastomosis on 10/31, now POD#5 with resolved post-op bleeding. Hb stable/increasing, oozing blood from surgical incision has stopped, surgical path shows no malignant source - Surgery following and appreciate recs - Tolerating soft solid diet, advance to renal diet - Norco 5-325 po Q6PRN, weaning as tolerate - Daily CBCs  AIDS, last CD 30 in September, elevated viral load, previous difficulty with insurance, refusing to take ART or ppx without food - Acquiring updated CD4 and viral load - Advancing diet - ART - Dapsone for PCP ppx - Azithro 1200mg  weekly for MAC ppx  ESRD on HD, s/p HD on 11/2, 11/4 - Daily BMPs, follow renal recs - Likely HD tomorrow  Hypertension, bp 177/94 this AM despite restarted Hydralazine and half dose Losartan -  Hydralazine 100 TID - Resume Losartan 100 mg daily, home dose - Reassess following HD - Restart home Dilt XL 180  Seizure disorder - Continue home keppra.  Dispo: Anticipated discharge in approximately 1-2 days.   LOS: 6 days   Jim Partridge, MD 01/03/2016, 7:05 AM Pager: [phone]

## 2016-01-03 NOTE — Progress Notes (Signed)
Pt remained in bed most of the day, not very motivated to do anything. Requires a lot of prompting to take po meds.

## 2016-01-04 DIAGNOSIS — K561 Intussusception: Principal | ICD-10-CM

## 2016-01-04 LAB — T-HELPER CELLS (CD4) COUNT (NOT AT ARMC)
CD4 % Helper T Cell: 2 % — ABNORMAL LOW (ref 33–55)
CD4 T Cell Abs: 10 /uL — ABNORMAL LOW (ref 400–2700)

## 2016-01-04 LAB — CBC
HCT: 25.8 % — ABNORMAL LOW (ref 39.0–52.0)
Hemoglobin: 8.5 g/dL — ABNORMAL LOW (ref 13.0–17.0)
MCH: 29.3 pg (ref 26.0–34.0)
MCHC: 32.9 g/dL (ref 30.0–36.0)
MCV: 89 fL (ref 78.0–100.0)
Platelets: 118 10*3/uL — ABNORMAL LOW (ref 150–400)
RBC: 2.9 MIL/uL — ABNORMAL LOW (ref 4.22–5.81)
RDW: 15.8 % — ABNORMAL HIGH (ref 11.5–15.5)
WBC: 5.3 10*3/uL (ref 4.0–10.5)

## 2016-01-04 LAB — BASIC METABOLIC PANEL
Anion gap: 15 (ref 5–15)
BUN: 67 mg/dL — ABNORMAL HIGH (ref 6–20)
CO2: 24 mmol/L (ref 22–32)
Calcium: 8.9 mg/dL (ref 8.9–10.3)
Chloride: 96 mmol/L — ABNORMAL LOW (ref 101–111)
Creatinine, Ser: 14.2 mg/dL — ABNORMAL HIGH (ref 0.61–1.24)
GFR calc Af Amer: 5 mL/min — ABNORMAL LOW (ref >60)
GFR calc non Af Amer: 4 mL/min — ABNORMAL LOW (ref >60)
Glucose, Bld: 80 mg/dL (ref 65–99)
Potassium: 4.3 mmol/L (ref 3.5–5.1)
Sodium: 135 mmol/L (ref 135–145)

## 2016-01-04 MED ORDER — HYDROCODONE-ACETAMINOPHEN 5-325 MG PO TABS: 1.0000 | ORAL_TABLET | Freq: Four times a day (QID) | ORAL | 0 refills | Status: DC | PRN

## 2016-01-04 MED ORDER — AMITRIPTYLINE HCL 25 MG PO TABS: 25.0000 mg | ORAL_TABLET | Freq: Every day | ORAL | 0 refills | Status: DC

## 2016-01-04 MED ORDER — AZITHROMYCIN 600 MG PO TABS: 1200.0000 mg | ORAL_TABLET | ORAL | 0 refills | Status: DC

## 2016-01-04 MED ORDER — ALBUTEROL SULFATE HFA 108 (90 BASE) MCG/ACT IN AERS: 2.0000 | INHALATION_SPRAY | Freq: Four times a day (QID) | RESPIRATORY_TRACT | 2 refills | Status: DC | PRN

## 2016-01-04 MED ORDER — DARBEPOETIN ALFA 100 MCG/0.5ML IJ SOSY
PREFILLED_SYRINGE | INTRAMUSCULAR | Status: AC
  Filled 2016-01-04: qty 0.5

## 2016-01-04 NOTE — Discharge Summary (Signed)
Name: Jim Wyatt MRN: [id number] DOB: [birth year] 35 y.o. PCP: Jim Garter, MD  Date of Admission: 12/28/2015 10:04 AM Date of Discharge: 01/10/2016 Attending Physician: No att. providers found  Discharge Diagnosis: 1. Acute ileocecal intussusception  Principal Problem:   Intussusception ([place]) Active Problems:   Acquired immune deficiency syndrome (HCC)   Human immunodeficiency virus (HIV) disease ([place])   Anemia due to end stage renal disease ([place])   Acute blood loss anemia   ESRD on dialysis ([place])   Hypertension   Seizure disorder ([place])   Discharge Medications:   Medication List    STOP taking these medications   calcium carbonate 500 MG chewable tablet Commonly known as:  TUMS - dosed in mg elemental calcium     TAKE these medications   abacavir 300 MG tablet Commonly known as:  ZIAGEN Take 2 tablets (600 mg total) by mouth daily with supper.   acetaminophen 500 MG tablet Commonly known as:  TYLENOL Take 1,000 mg by mouth every 6 (six) hours as needed (pain).   albuterol 108 (90 Base) MCG/ACT inhaler Commonly known as:  PROVENTIL HFA;VENTOLIN HFA Inhale 2 puffs into the lungs every 6 (six) hours as needed for wheezing or shortness of breath.   amitriptyline 25 MG tablet Commonly known as:  ELAVIL Take 1 tablet (25 mg total) by mouth at bedtime.   azithromycin 600 MG tablet Commonly known as:  ZITHROMAX Take 2 tablets (1,200 mg total) by mouth once a week.   CARTIA XT 180 MG 24 hr capsule Generic drug:  diltiazem Take 180 mg by mouth daily.   carvedilol 25 MG tablet Commonly known as:  COREG Take 25 mg by mouth 2 (two) times daily.   dapsone 100 MG tablet Take 1 tablet (100 mg total) by mouth daily.   darunavir-cobicistat 800-150 MG tablet Commonly known as:  PREZCOBIX Take 1 tablet by mouth every evening. Swallow whole. Do NOT crush, break or chew tablets. Take with food.   dolutegravir 50 MG tablet Commonly known as:   TIVICAY Take 1 tablet (50 mg total) by mouth every evening.   hydrALAZINE 100 MG tablet Commonly known as:  APRESOLINE Take 100 mg by mouth 3 (three) times daily. 6:30am, 2:30pm, 11pm - for high blood pressure   HYDROcodone-acetaminophen 5-325 MG tablet Commonly known as:  NORCO/VICODIN Take 1-2 tablets by mouth every 6 (six) hours as needed for moderate pain or severe pain.   levETIRAcetam 500 MG tablet Commonly known as:  KEPPRA Take 500 mg by mouth See admin instructions. Take 1 tablet (500 mg) by mouth Sunday, Monday, Wednesday, Friday at 7pm (non-dialysis days) What changed:  Another medication with the same name was changed. Make sure you understand how and when to take each.   levETIRAcetam 250 MG tablet Commonly known as:  KEPPRA Take 1 tablet (250 mg total) by mouth See admin instructions. Take 1 tablet after your dialysis treatments. What changed:  additional instructions   losartan 100 MG tablet Commonly known as:  COZAAR Take 100 mg by mouth at bedtime.   ondansetron 8 MG disintegrating tablet Commonly known as:  ZOFRAN ODT Take 1 tablet (8 mg total) by mouth every 8 (eight) hours as needed for nausea or vomiting.   VELPHORO 500 MG chewable tablet Generic drug:  sucroferric oxyhydroxide Chew 500 mg by mouth See admin instructions. Chew 1 tablet (500 mg) by mouth after every meal (2-3 times daily)       Disposition and follow-up:  [name] was discharged from [facility] in Stable condition.  At the hospital follow up visit please address:  1.  Intussusception s/p ileocecal resection, assess abdominal exam and incision for healing or lack thereof  ESRD on HD - ensure that patient is attending dialysis sessions TTS  AIDS/HIV - last CD4 count of 10 on 01/03/16, patient intermittently refusing ART during hospital stay due to inadequate diet options - ensure that patient is taking his ART and PCP/MAC prophylaxis  2.  Labs / imaging  needed at time of follow-up: CBC  3.  Pending labs/ test needing follow-up: HIV-1 RNA ultraquant  Follow-up Appointments: Follow-up Information    Jim Riley, MD Follow up on 01/14/2016.   Specialty:  General Surgery Why:   You have a follow up appointment at 2 PM, be at the office 30 minutes early for check in.   Contact information: [phone]        Jim Chessman, MD. Schedule an appointment as soon as possible for a visit.   Specialty:  Internal Medicine Why:  for follow up within two weeks Contact information: [place] [address] [phone]           Hospital Course by problem list: Principal Problem:   Intussusception [facility]) Active Problems:   Acquired immune deficiency syndrome ([place])   Human immunodeficiency virus (HIV) disease ([place])   Anemia due to end stage renal disease ([place])   Acute blood loss anemia   ESRD on dialysis ([place])   Hypertension   Seizure disorder ([place])   1. Intussusception Jim Wyatt presented with acute RLQ abdominal pain on 10/31 and was discovered to have ileocolic intussusception on abdominal CT. He went to the OR on 10/31 for exploratory laparotomy and ileocecectomy, surgical pathology revealed inflamed and ischemic appendix. His post op course was complicated by post-op bleeding, hypotension, and oozing from his incision that required transfusion of two units of blood. This bleeding was likely exacerbated by uremia in the setting ESRD and missing most of his previous dialysis session. He received hemodialysis while inpatient on 11/2, 11/4, 11/7 with his bleeding dramatically improving after the initial session. He also had significant post-op pain requiring PCA pump for several days. PCA pump was discontinued on 11/5 and he was successfully transitioned to an oral pain regimen. Following the surgery he was NPO for several days, he began a liquid diet on 11/4 and was advanced without complication to full diet by  discharge on 11/7. His hemoglobin remained stable for three days before discharge, and he was discharge home with PCP and gen surg follow up.  2. ESRD on HD, secondary to HIVAN, presented from dialysis on 10/31, his session was cut short. He was becoming increasingly uremic and hyperkalemic on 11/1 and received IVF to stabilize him until his first dialysis session on 11/2. He tolerated dialysis on 11/4 and 11/7 as well.   3. HIV/AIDS, previously untreated with CD4 count of 30 in Sept 2017, had not started ART due to insurance issues. His home ART regimen and prophylactic medicines were started while inpatient but he did not receive them consistently until 11/4. This was initially due to the patient being NPO for 2-3 days post-op, followed by the patient then refusing to take these medications until he had a full diet. Patient was compliant with these medications by the time of discharge.   Discharge Vitals:   BP 137/73 (BP Location: Right Arm)   Pulse (!) 106  Temp 100.2 F (37.9 C) (Oral)   Resp 18   Ht 5\' 7"  (1.702 m)   Wt 65.2 kg (143 lb 11.8 oz) Comment: weighed in bed   SpO2 93%   BMI 22.51 kg/m   Pertinent Labs, Studies, and Procedures:     Ref Range & Units 6d ago 7d ago 8d ago   WBC 4.0 - 10.5 K/uL 5.3  4.5  4.6    RBC 4.22 - 5.81 MIL/uL 2.90   2.87   3.05     Hemoglobin 13.0 - 17.0 g/dL 8.5   8.3   9.0     HCT 39.0 - 52.0 % 25.8   25.6   27.5     MCV 78.0 - 100.0 fL 89.0  89.2  90.2    MCH 26.0 - 34.0 pg 29.3  28.9  29.5    MCHC 30.0 - 36.0 g/dL 32.9  32.4  32.7    RDW 11.5 - 15.5 % 15.8   15.4  15.6     Platelets 150 - 400 K/uL 118   103CM   99CM    Comments: CONSISTENT WITH PREVIOUS RESULT     Ref Range & Units 7d ago 29mo ago 65mo ago   CD4 T Cell Abs 400 - 2,700 /uL 10   20   10      CD4 % Helper T Cell 33 - 55 % 2   2CM   3CM     CT Abdomen Pelvis WO contrast - 12/28/15 IMPRESSION: 1. Dilated and potentially inflamed loop of bowel in the right abdomen is thought to  be colon, and there appears to be incorporation of adipose tissue into the center of this structure strongly favoring ileocolic intussusception. 2. Because of the severity of fluid infiltration in the mesentery and fatty tissues, as well as the lack of oral and IV contrast, this abnormality in the right abdomen is not entirely specific. I do favor intussusception. The fluid infiltration could be due to hypoproteinemia/hypoalbuminemia or right heart failure. 3. Suspected small ventral upper abdominal hernia containing omental adipose tissue. 4. Mild to moderate enlargement of the heart. 5. Ascites in the right paracolic gutter and a small amount of pelvic ascites.  Surgical Pathology: Colon, segmental resection, Ileocecetomy - BENIGN SMALL BOWEL AND COLONIC TYPE MUCOSA WITH EDEMATOUS SUBMUCOSA. - SEROSITIS. - ISCHEMIC TYPE CHANGES. - MILDLY INFLAMED APPENDIX. - FOUR BENIGN LYMPH NODES (0/4). - THE SURGICAL RESECTION MARGINS ARE HISTOLOGICALLY VIABLE.  Discharge Instructions: Discharge Instructions    Call MD for:  persistant dizziness or light-headedness    Complete by:  As directed    Call MD for:  persistant nausea and vomiting    Complete by:  As directed    Call MD for:  redness, tenderness, or signs of infection (pain, swelling, redness, odor or green/yellow discharge around incision site)    Complete by:  As directed    Call MD for:  severe uncontrolled pain    Complete by:  As directed    Call MD for:  temperature >100.4    Complete by:  As directed    Diet - low sodium heart healthy    Complete by:  As directed    Discharge instructions    Complete by:  As directed    Is continue to take her medications as prescribed. It is essential that you adhere to your antiretroviral and take these medications religiously - as interruptions lead to viral resistance. Please continue to attend your scheduled dialysis  sessions 3 times weekly. Please schedule follow-up appointment with  your primary care doctor and attend the scheduled surgery follow-up visit on 11/17.   We have provided a short prescription for pain medication to cover the next 2-3 days. This medication should be weaned down and discontinued, as you are more than a week out from surgery and healing well. Tylenol and/or ibuprofen should be sufficient hereafter.  If you develops high fevers, severe abdominal pain, uncontrolled nausea vomiting, bleeding or leakage of purulent fluid - please visit the ER immediately for evaluation.   Increase activity slowly    Complete by:  As directed       Signed: Asencion Partridge, MD 01/10/2016, 5:35 PM   Pager: [phone]

## 2016-01-04 NOTE — Care Management Note (Addendum)
Case Management Note  Patient Details  Name: Jim Wyatt MRN: [id number] Date of Birth: [birth year]  Subjective/Objective:                    Action/Plan: [facility] was able to locate insurance information. Patient states he has BCBS , but does not have insurance card or information with him and cannot "get it" . Explained need it for records and to arrange home health . Patient consented to NCM giving [place] his social security number . [facility] trying to research insurance information.   Expected Discharge Date:                  Expected Discharge Plan:  [place]  In-House Referral:     Discharge planning Services  CM Consult  Post Acute Care Choice:  Home Health Choice offered to:  Patient  DME Arranged:    DME Agency:     HH Arranged:  RN, PT [place] Agency:  [place]  Status of Service:  In process, will continue to follow  If discussed at Long Length of Stay Meetings, dates discussed:    Additional Comments: 01/04/2016  CM spoke with pt directly regarding "medication assistance" per AVS - pt stated he didn't need any assistance but deferred CM to mom.  CM asked mom and she deferred CM to pt.  CM contacted infectious disease doctor Jim Wyatt and informed him that pt may have an issue paying for HIV meds - CM was informed to tell pt that clinic would be contacting him today or tomorrow morning to ensure pt can afford his medications - CM confirmed pts phone number with pt and provided phone number to dr Jim Wyatt.  CM spoke with pt directly in room - mom at bedside and immediately asked pt about I&D meds in the presence of CM - CM did not discuss HIV status during any interaction with mom.  CM did not mention/discuss HIV status during any interaction with mom.  Pt informed mom and CM that he no longer has an issue with affording I&D meds "my copay is only $10" - pt would not elaborate which med was $10.  Mom asked about  hydralazine - CM informed mom how to locate $4 list on the Internet.  Pt has PCP with [facility] and is active with I&D clinic. Pt has discharge order written - stays with roommate and mom will help when needed.  CM confirmed with [facility] and confirmed referral was accepted  (insurance accepted) - including specified dressing changes.  Pt to discharge home today Maryclare Labrador, RN 01/04/2016, 3:03 PM

## 2016-01-04 NOTE — Progress Notes (Signed)
Subjective: Visited Mr. Brayboy at his dialysis session today. He reports his abdominal pain remains prominent, but improving. Reports having another BM yesterday. He is tolerating solid diet. He has been ambulatory with assistance and is agreeable to [place] setting up [facility] PT and RN.   Objective: Vital signs in last 24 hours: Vitals:   01/04/16 0731 01/04/16 0800 01/04/16 0830 01/04/16 0900  BP: 132/66 (!) 145/79 126/70 123/65  Pulse: 93 97 98 98  Resp:      Temp:      TempSrc:      SpO2:      Weight:      Height:        Intake/Output Summary (Last 24 hours) at 01/04/16 0906 Last data filed at 01/04/16 0516  Gross per 24 hour  Intake              540 ml  Output                0 ml  Net              540 ml   Physical Exam General appearance: Thin, tired-appearing gentleman resting in bed, mumbles short responses HENT: Normocephalic, atraumatic Cardiovascular: Normal rate and rhythm, no murmurs, rubs, gallops Respiratory/Chest: Clear to ausculation bilaterally, normal work of breathing Abdomen: Bowel sounds present, abdominal binder in place, visible underlying bandage clean/dry/intact, mildly tender to palpation  Skin: Warm, dry, intact Psych: Blunt affect  Labs / Imaging / Procedures: CBC Latest Ref Rng & Units 01/04/2016 01/03/2016 01/02/2016  WBC 4.0 - 10.5 K/uL 5.3 4.5 4.6  Hemoglobin 13.0 - 17.0 g/dL 8.5(L) 8.3(L) 9.0(L)  Hematocrit 39.0 - 52.0 % 25.8(L) 25.6(L) 27.5(L)  Platelets 150 - 400 K/uL 118(L) 103(L) 99(L)   BMP Latest Ref Rng & Units 01/04/2016 01/03/2016 01/02/2016  Glucose 65 - 99 mg/dL 80 112(H) 81  BUN 6 - 20 mg/dL 67(H) 51(H) 32(H)  Creatinine 0.61 - 1.24 mg/dL 14.20(H) 11.44(H) 8.79(H)  Sodium 135 - 145 mmol/L 135 133(L) 134(L)  Potassium 3.5 - 5.1 mmol/L 4.3 3.8 4.0  Chloride 101 - 111 mmol/L 96(L) 95(L) 97(L)  CO2 22 - 32 mmol/L 24 28 26   Calcium 8.9 - 10.3 mg/dL 8.9 8.7(L) 8.7(L)   No results found.  Assessment/Plan: Jim Wyatt is a 35 y.o.  gentleman with PMH AIDS, ESRD on HD TTS, ACD  admitted for acute abdominal, found to have ileocecal intussusception and taken to OR for resection.   Ileocecal intussusception, with infarcted appendix, s/p resection and anastomosis on 10/31, now POD#5 with resolved post-op bleeding. Hb stable/increasing, oozing blood from surgical incision has stopped, surgical path shows no malignant source - Hb stable, bowel function returned and tolerating solid diet - Norco 5-325 po Q6PRN supply for 2-3  AIDS, last CD 30 in September, elevated viral load, previous difficulty with insurance, refusing to take ART or ppx without food - Acquiring updated CD4 and viral load - Taking ART now that he has solid food diet - Dapsone for PCP ppx - Azithro 1200mg  weekly for MAC ppx  ESRD on HD, s/p HD on 11/2, 11/4, 11/7 - HD today  Hypertension, bp 177/94 this AM despite restarted Hydralazine and half dose Losartan - Hydralazine 100 TID - Resume Losartan 100 mg daily, home dose - Reassess following HD - Restart home Dilt XL 180  Seizure disorder - Continue home keppra.  Dispo: Anticipated discharge today. Follow up health and wellness.   LOS: 7 days   [place] Skye  Wynetta Emery, MD 01/04/2016, 9:06 AM Pager: [phone]

## 2016-01-04 NOTE — Procedures (Signed)
I was present at this dialysis session. I have reviewed the session itself and made appropriate changes.   Filed Weights   01/01/16 0825 01/01/16 1214 01/04/16 0717  Weight: 69.2 kg (152 lb 8.9 oz) 65.7 kg (144 lb 13.5 oz) 66.4 kg (146 lb 6.2 oz)     Recent Labs Lab 01/01/16 1433  01/04/16 0601  NA  --   < > 135  K  --   < > 4.3  CL  --   < > 96*  CO2  --   < > 24  GLUCOSE  --   < > 80  BUN  --   < > 67*  CREATININE  --   < > 14.20*  CALCIUM  --   < > 8.9  PHOS 7.0*  --   --   < > = values in this interval not displayed.   Recent Labs Lab 01/02/16 0423 01/03/16 0143 01/04/16 0601  WBC 4.6 4.5 5.3  HGB 9.0* 8.3* 8.5*  HCT 27.5* 25.6* 25.8*  MCV 90.2 89.2 89.0  PLT 99* 103* 118*    Scheduled Meds: . abacavir  600 mg Oral Q supper  . amitriptyline  25 mg Oral QHS  . azithromycin  1,200 mg Oral Q Wed  . carvedilol  12.5 mg Oral BID WC  . dapsone  100 mg Oral Daily  . darbepoetin (ARANESP) injection - DIALYSIS  100 mcg Intravenous Q Tue-HD  . darunavir-cobicistat  1 tablet Oral QPM  . diltiazem  180 mg Oral Daily  . docusate sodium  100 mg Oral BID  . dolutegravir  50 mg Oral QPM  . heparin  5,000 Units Subcutaneous Q8H  . hydrALAZINE  100 mg Oral Q8H  . levETIRAcetam  250 mg Oral Once per day on Tue Thu Sat  . levETIRAcetam  500 mg Oral Once per day on Sun Mon Wed Fri  . losartan  100 mg Oral Daily  . multivitamin  1 tablet Oral QHS  . sodium chloride flush  3 mL Intravenous Q12H  . sucroferric oxyhydroxide  500 mg Oral TID WC   Continuous Infusions: . sodium chloride 1 mL (12/31/15 1523)   PRN Meds:.acetaminophen **OR** acetaminophen, albuterol, diphenhydrAMINE **OR** diphenhydrAMINE, HYDROcodone-acetaminophen, Influenza vac split quadrivalent PF, ondansetron **OR** ondansetron (ZOFRAN) IV, pneumococcal 23 valent vaccine, traMADol   Donetta Potts,  MD 01/04/2016, 8:13 AM

## 2016-01-04 NOTE — Progress Notes (Signed)
   01/04/16 0900  OT Visit Information  Last OT Received On 01/04/16  Reason Eval/Treat Not Completed Patient at procedure or test/ unavailable. Pt at hemodialysis this am. Will return as able.  History of Present Illness Pt is a 35 y/o male s/p ex lap with bowel resection secondary to intussusception. PMH including but not limited to HIV/AIDS, ESRD and HTN.  [place], [place]

## 2016-01-04 NOTE — Progress Notes (Signed)
Physical Therapy Cancellation Note   01/04/16 0825  PT Visit Information  Last PT Received On 01/04/16  Reason Eval/Treat Not Completed Patient at procedure or test/unavailable; Pt in HD this am. PT will check on pt later as time allows.   History of Present Illness Pt is a 35 y/o male s/p ex lap with bowel resection secondary to intussusception. PMH including but not limited to HIV/AIDS, ESRD and HTN.   Earney Navy, PTA Pager: [phone]

## 2016-01-04 NOTE — Progress Notes (Signed)
Physical Therapy Treatment Patient Details Name: Jim Wyatt MRN: [id number] DOB: 1980-09-09 Today's Date: 01/04/2016    History of Present Illness Pt is a 35 y/o male s/p ex lap with bowel resection secondary to intussusception. PMH including but not limited to HIV/AIDS, ESRD and HTN.    PT Comments    Pt tolerated gait this session. Encouraged to participate in therex but pt declined. Mother present for session. Pt is a little unsteady with gait but no LOB and generally weak. Current plan remains appropriate.   Follow Up Recommendations  Home health PT;Supervision/Assistance - 24 hour     Equipment Recommendations  None recommended by PT    Recommendations for Other Services       Precautions / Restrictions Precautions Precautions: Fall Restrictions Weight Bearing Restrictions: No    Mobility  Bed Mobility Overal bed mobility: Modified Independent Bed Mobility: Supine to Sit           General bed mobility comments: increased time and effort; cues for sequencing and technique to decrease abdominal pain but pt stated "I have my own way of doing it"  Transfers Overall transfer level: Needs assistance Equipment used: None Transfers: Sit to/from Stand Sit to Stand: Min guard         General transfer comment: min guard for safety  Ambulation/Gait Ambulation/Gait assistance: Min guard Ambulation Distance (Feet): 100 Feet Assistive device: 1 person hand held assist Gait Pattern/deviations: Step-through pattern;Decreased stride length;Drifts right/left     General Gait Details: pt a little unsteady with gait but no LOB; mother present and actively participating   Stairs            Wheelchair Mobility    Modified Rankin (Stroke Patients Only)       Balance     Sitting balance-Leahy Scale: Good       Standing balance-Leahy Scale: Good                      Cognition Arousal/Alertness: Awake/alert Behavior During Therapy: WFL  for tasks assessed/performed Overall Cognitive Status: Within Functional Limits for tasks assessed                      Exercises      General Comments General comments (skin integrity, edema, etc.): pt refused sitting up in recliner; pt educated on benefits of being OOB and upright; encouraged lying flat to stretch abdomen while healing      Pertinent Vitals/Pain Pain Assessment: Faces Faces Pain Scale: Hurts little more Pain Location: abdomen Pain Descriptors / Indicators: Sore Pain Intervention(s): Limited activity within patient's tolerance;Monitored during session;Premedicated before session;Repositioned    Home Living                      Prior Function            PT Goals (current goals can now be found in the care plan section) Acute Rehab PT Goals Patient Stated Goal: decrease pain PT Goal Formulation: With patient Time For Goal Achievement: 01/12/16 Potential to Achieve Goals: Good Progress towards PT goals: Progressing toward goals    Frequency    Min 3X/week      PT Plan Current plan remains appropriate    Co-evaluation             End of Session Equipment Utilized During Treatment: Gait belt Activity Tolerance: Patient tolerated treatment well Patient left: in bed;with call bell/phone within reach (sitting EOB with  mother present)     Time: 4730-8569 PT Time Calculation (min) (ACUTE ONLY): 18 min  Charges:  $Gait Training: 8-22 mins                    G Codes:      [place] April, PTA Pager: [phone]   01/04/2016, 3:34 PM

## 2016-01-04 NOTE — Progress Notes (Signed)
7 Days Post-Op  Subjective: He is having allot of abdominal pain this afternoon after HD.  He did not eat, was taken to HD. Eating well and was having BM yesterday.  Open wound OK.  He had some necrotic tissue in the mid line and I cleaned it away with an applicator stick.the wound.  Wound looks fine and is otherwise fine.    Objective: Vital signs in last 24 hours: Temp:  [98.2 F (36.8 C)-99.1 F (37.3 C)] 98.2 F (36.8 C) (11/07 1101) Pulse Rate:  [89-99] 98 (11/07 1101) Resp:  [16-20] 20 (11/07 0717) BP: (115-145)/(61-97) 128/71 (11/07 1101) SpO2:  [92 %-95 %] 94 % (11/07 1101) Weight:  [65.2 kg (143 lb 11.8 oz)-66.4 kg (146 lb 6.2 oz)] 65.2 kg (143 lb 11.8 oz) (11/07 1101) Last BM Date: 12/28/15  540 PO recorded Afebrile, VSS WBC is normal  Intake/Output from previous day: 11/06 0701 - 11/07 0700 In: 540 [P.O.:540] Out: -  Intake/Output this shift: Total I/O In: -  Out: 1000 [Other:1000]  General appearance: alert, cooperative, mild distress and having pain, no nausea or vomiting.  Just very uncomfortable. GI: Open wound looks fine, I cleaned some necrotic tissue in the midline with applicator stick. The wound is clean and looks good otherwise.  + BS, tolerating diet and having BM  Lab Results:   Recent Labs  01/03/16 0143 01/04/16 0601  WBC 4.5 5.3  HGB 8.3* 8.5*  HCT 25.6* 25.8*  PLT 103* 118*    BMET  Recent Labs  01/03/16 0143 01/04/16 0601  NA 133* 135  K 3.8 4.3  CL 95* 96*  CO2 28 24  GLUCOSE 112* 80  BUN 51* 67*  CREATININE 11.44* 14.20*  CALCIUM 8.7* 8.9   PT/INR No results for input(s): LABPROT, INR in the last 72 hours.   Recent Labs Lab 12/29/15 0532 12/30/15 0326 01/01/16 1433  AST 128*  --   --   ALT 37  --   --   ALKPHOS 38  --   --   BILITOT 0.8  --   --   PROT 6.5  --   --   ALBUMIN 2.3* 2.6* 2.4*     Lipase     Component Value Date/Time   LIPASE 43 12/28/2015 1011     Studies/Results: No results  found.  Medications: . abacavir  600 mg Oral Q supper  . amitriptyline  25 mg Oral QHS  . azithromycin  1,200 mg Oral Q Wed  . carvedilol  12.5 mg Oral BID WC  . dapsone  100 mg Oral Daily  . darbepoetin (ARANESP) injection - DIALYSIS  100 mcg Intravenous Q Tue-HD  . darunavir-cobicistat  1 tablet Oral QPM  . diltiazem  180 mg Oral Daily  . docusate sodium  100 mg Oral BID  . dolutegravir  50 mg Oral QPM  . heparin  5,000 Units Subcutaneous Q8H  . hydrALAZINE  100 mg Oral Q8H  . levETIRAcetam  250 mg Oral Once per day on Tue Thu Sat  . levETIRAcetam  500 mg Oral Once per day on Sun Mon Wed Fri  . losartan  100 mg Oral Daily  . multivitamin  1 tablet Oral QHS  . sodium chloride flush  3 mL Intravenous Q12H  . sucroferric oxyhydroxide  500 mg Oral TID WC    Assessment/Plan ileocolic intussusception which reduced during dissection, infarcted appendix Exploratory Laparotomy, ileocecectomy 12/28/15, Dr. Romana Juniper POD 7 Post op hypotension Anemia/thrombocytopenia- H/H 12.6/38.3 yesterday  AM, now down to 8.2/26.1 ESRD on HD HD TTS HIV/AIDS  Seizures Hypertension Asthma FEN: Renal diet QD:IYMEB pre op; azithromycin weekly back on HIV Rx  DVT: SCD   Plan:  Continue wound care wet to dry BID.  He just got some PO pain meds.  Will follow with you.  He has follow up with Dr. Kae Heller and dressing instructions for home in the AVS.  Case manager is working on home health for dressing changes and I told him family needs to learn how to do dressing changes.    LOS: 7 days    Trudi Morgenthaler 01/04/2016 [phone]

## 2016-01-04 NOTE — Progress Notes (Signed)
Pt medicated with prn tylenol before discharged home with his mom. Education on importance of incentive spirometry given. Went over discharge teaching with pt's mom before discharging him in stable condition, no concerns expressed. AVS, work notes and discharge scripts provided

## 2016-01-04 NOTE — Progress Notes (Signed)
  Pt's mom has concerns regarding discharging pt today as he has a fever as charted. MD paged

## 2016-01-06 ENCOUNTER — Telehealth: Payer: Self-pay | Admitting: Internal Medicine

## 2016-01-06 DIAGNOSIS — Z23 Encounter for immunization: Secondary | ICD-10-CM | POA: Diagnosis not present

## 2016-01-06 DIAGNOSIS — N2581 Secondary hyperparathyroidism of renal origin: Secondary | ICD-10-CM | POA: Diagnosis not present

## 2016-01-06 DIAGNOSIS — D509 Iron deficiency anemia, unspecified: Secondary | ICD-10-CM | POA: Diagnosis not present

## 2016-01-06 DIAGNOSIS — N186 End stage renal disease: Secondary | ICD-10-CM | POA: Diagnosis not present

## 2016-01-06 NOTE — Telephone Encounter (Signed)
Pts mother calling stating she is filing for FMLA Papers will be faxed to [facility] today or tomorrow   Pts mother is filing for FMLA due to her taking care of pt  Pt had appendix surgery and she is currently driving to his home from [place] to change his dressings  Pts mother states pt also is in renal failure and undergoes dialysis. For this reason she is requesting where it states "occurance" on the form, for "intermident" to be stated due to her continuous help

## 2016-01-07 NOTE — Telephone Encounter (Signed)
Thank you. Dr. Doreene Burke had left an FMLA paperwork here at [facility] for me and I wanted to make sure it wasn't the one I already had.

## 2016-01-07 NOTE — Telephone Encounter (Signed)
Jim Wyatt

## 2016-01-07 NOTE — Telephone Encounter (Signed)
MA will look out for paperwork. What is the mothers name?

## 2016-01-08 DIAGNOSIS — N2581 Secondary hyperparathyroidism of renal origin: Secondary | ICD-10-CM | POA: Diagnosis not present

## 2016-01-08 DIAGNOSIS — N186 End stage renal disease: Secondary | ICD-10-CM | POA: Diagnosis not present

## 2016-01-08 DIAGNOSIS — Z23 Encounter for immunization: Secondary | ICD-10-CM | POA: Diagnosis not present

## 2016-01-08 DIAGNOSIS — D509 Iron deficiency anemia, unspecified: Secondary | ICD-10-CM | POA: Diagnosis not present

## 2016-01-09 ENCOUNTER — Inpatient Hospital Stay (HOSPITAL_COMMUNITY)
Admission: EM | Admit: 2016-01-09 | Discharge: 2016-01-11 | DRG: 919 | Disposition: A | Discharge status: Home / Self Care | Payer: Medicare Other | Attending: Internal Medicine | Admitting: Internal Medicine

## 2016-01-09 ENCOUNTER — Encounter (HOSPITAL_COMMUNITY): Payer: Self-pay | Admitting: *Deleted

## 2016-01-09 DIAGNOSIS — I959 Hypotension, unspecified: Secondary | ICD-10-CM | POA: Diagnosis present

## 2016-01-09 DIAGNOSIS — I120 Hypertensive chronic kidney disease with stage 5 chronic kidney disease or end stage renal disease: Secondary | ICD-10-CM | POA: Diagnosis present

## 2016-01-09 DIAGNOSIS — J45909 Unspecified asthma, uncomplicated: Secondary | ICD-10-CM | POA: Diagnosis present

## 2016-01-09 DIAGNOSIS — Z8249 Family history of ischemic heart disease and other diseases of the circulatory system: Secondary | ICD-10-CM

## 2016-01-09 DIAGNOSIS — D62 Acute posthemorrhagic anemia: Secondary | ICD-10-CM | POA: Diagnosis present

## 2016-01-09 DIAGNOSIS — N186 End stage renal disease: Secondary | ICD-10-CM

## 2016-01-09 DIAGNOSIS — F329 Major depressive disorder, single episode, unspecified: Secondary | ICD-10-CM | POA: Diagnosis present

## 2016-01-09 DIAGNOSIS — K561 Intussusception: Secondary | ICD-10-CM | POA: Diagnosis present

## 2016-01-09 DIAGNOSIS — Z992 Dependence on renal dialysis: Secondary | ICD-10-CM

## 2016-01-09 DIAGNOSIS — N2581 Secondary hyperparathyroidism of renal origin: Secondary | ICD-10-CM | POA: Diagnosis present

## 2016-01-09 DIAGNOSIS — Z888 Allergy status to other drugs, medicaments and biological substances status: Secondary | ICD-10-CM

## 2016-01-09 DIAGNOSIS — D631 Anemia in chronic kidney disease: Secondary | ICD-10-CM | POA: Diagnosis present

## 2016-01-09 DIAGNOSIS — K922 Gastrointestinal hemorrhage, unspecified: Secondary | ICD-10-CM | POA: Diagnosis not present

## 2016-01-09 DIAGNOSIS — G40909 Epilepsy, unspecified, not intractable, without status epilepticus: Secondary | ICD-10-CM | POA: Diagnosis present

## 2016-01-09 DIAGNOSIS — Z9049 Acquired absence of other specified parts of digestive tract: Secondary | ICD-10-CM

## 2016-01-09 DIAGNOSIS — K91840 Postprocedural hemorrhage and hematoma of a digestive system organ or structure following a digestive system procedure: Principal | ICD-10-CM | POA: Diagnosis present

## 2016-01-09 DIAGNOSIS — Z841 Family history of disorders of kidney and ureter: Secondary | ICD-10-CM

## 2016-01-09 DIAGNOSIS — Z79899 Other long term (current) drug therapy: Secondary | ICD-10-CM

## 2016-01-09 DIAGNOSIS — I10 Essential (primary) hypertension: Secondary | ICD-10-CM | POA: Diagnosis present

## 2016-01-09 DIAGNOSIS — K921 Melena: Secondary | ICD-10-CM | POA: Diagnosis present

## 2016-01-09 DIAGNOSIS — B20 Human immunodeficiency virus [HIV] disease: Secondary | ICD-10-CM | POA: Diagnosis present

## 2016-01-09 DIAGNOSIS — Z833 Family history of diabetes mellitus: Secondary | ICD-10-CM

## 2016-01-09 DIAGNOSIS — Z881 Allergy status to other antibiotic agents status: Secondary | ICD-10-CM

## 2016-01-09 DIAGNOSIS — Z86718 Personal history of other venous thrombosis and embolism: Secondary | ICD-10-CM | POA: Diagnosis not present

## 2016-01-09 LAB — COMPREHENSIVE METABOLIC PANEL
ALT: 8 U/L — ABNORMAL LOW (ref 17–63)
AST: 55 U/L — ABNORMAL HIGH (ref 15–41)
Albumin: 2 g/dL — ABNORMAL LOW (ref 3.5–5.0)
Alkaline Phosphatase: 30 U/L — ABNORMAL LOW (ref 38–126)
Anion gap: 10 (ref 5–15)
BUN: 36 mg/dL — ABNORMAL HIGH (ref 6–20)
CO2: 30 mmol/L (ref 22–32)
Calcium: 8.7 mg/dL — ABNORMAL LOW (ref 8.9–10.3)
Chloride: 97 mmol/L — ABNORMAL LOW (ref 101–111)
Creatinine, Ser: 8.83 mg/dL — ABNORMAL HIGH (ref 0.61–1.24)
GFR calc Af Amer: 8 mL/min — ABNORMAL LOW (ref >60)
GFR calc non Af Amer: 7 mL/min — ABNORMAL LOW (ref >60)
Glucose, Bld: 137 mg/dL — ABNORMAL HIGH (ref 65–99)
Potassium: 3.8 mmol/L (ref 3.5–5.1)
Sodium: 137 mmol/L (ref 135–145)
Total Bilirubin: 0.5 mg/dL (ref 0.3–1.2)
Total Protein: 6.2 g/dL — ABNORMAL LOW (ref 6.5–8.1)

## 2016-01-09 LAB — CBC
HCT: 18.3 % — ABNORMAL LOW (ref 39.0–52.0)
Hemoglobin: 5.9 g/dL — CL (ref 13.0–17.0)
MCH: 28.9 pg (ref 26.0–34.0)
MCHC: 32.2 g/dL (ref 30.0–36.0)
MCV: 89.7 fL (ref 78.0–100.0)
Platelets: 178 10*3/uL (ref 150–400)
RBC: 2.04 MIL/uL — ABNORMAL LOW (ref 4.22–5.81)
RDW: 15.8 % — ABNORMAL HIGH (ref 11.5–15.5)
WBC: 4.6 10*3/uL (ref 4.0–10.5)

## 2016-01-09 LAB — PREPARE RBC (CROSSMATCH)

## 2016-01-09 MED ORDER — LOSARTAN POTASSIUM 50 MG PO TABS
100.0000 mg | ORAL_TABLET | Freq: Every day | ORAL | Status: DC
  Administered 2016-01-09: 100 mg via ORAL
  Filled 2016-01-09: qty 2

## 2016-01-09 MED ORDER — ONDANSETRON 4 MG PO TBDP: 8.0000 mg | ORAL_TABLET | Freq: Three times a day (TID) | ORAL | Status: DC | PRN

## 2016-01-09 MED ORDER — DAPSONE 100 MG PO TABS
100.0000 mg | ORAL_TABLET | Freq: Every day | ORAL | Status: DC
  Administered 2016-01-09 – 2016-01-11 (×3): 100 mg via ORAL
  Filled 2016-01-09 (×3): qty 1

## 2016-01-09 MED ORDER — ALBUTEROL SULFATE (2.5 MG/3ML) 0.083% IN NEBU: 3.0000 mL | INHALATION_SOLUTION | Freq: Four times a day (QID) | RESPIRATORY_TRACT | Status: DC | PRN

## 2016-01-09 MED ORDER — ACETAMINOPHEN 500 MG PO TABS: 1000.0000 mg | ORAL_TABLET | Freq: Four times a day (QID) | ORAL | Status: DC | PRN

## 2016-01-09 MED ORDER — ACETAMINOPHEN 325 MG PO TABS: 650.0000 mg | ORAL_TABLET | Freq: Four times a day (QID) | ORAL | Status: DC | PRN

## 2016-01-09 MED ORDER — CARVEDILOL 25 MG PO TABS
25.0000 mg | ORAL_TABLET | Freq: Two times a day (BID) | ORAL | Status: DC
  Administered 2016-01-09 – 2016-01-11 (×4): 25 mg via ORAL
  Filled 2016-01-09 (×4): qty 1

## 2016-01-09 MED ORDER — ONDANSETRON HCL 4 MG PO TABS
4.0000 mg | ORAL_TABLET | Freq: Four times a day (QID) | ORAL | Status: DC | PRN
  Administered 2016-01-09 – 2016-01-10 (×2): 4 mg via ORAL
  Filled 2016-01-09 (×3): qty 1

## 2016-01-09 MED ORDER — SENNOSIDES-DOCUSATE SODIUM 8.6-50 MG PO TABS: 1.0000 | ORAL_TABLET | Freq: Every evening | ORAL | Status: DC | PRN

## 2016-01-09 MED ORDER — ONDANSETRON HCL 4 MG/2ML IJ SOLN
4.0000 mg | Freq: Four times a day (QID) | INTRAMUSCULAR | Status: DC | PRN
Start: 2016-01-09 — End: 2016-01-11

## 2016-01-09 MED ORDER — HYDROCODONE-ACETAMINOPHEN 5-325 MG PO TABS
1.0000 | ORAL_TABLET | Freq: Four times a day (QID) | ORAL | Status: DC | PRN
  Administered 2016-01-11: 2 via ORAL
  Filled 2016-01-09: qty 2

## 2016-01-09 MED ORDER — HYDRALAZINE HCL 50 MG PO TABS
100.0000 mg | ORAL_TABLET | Freq: Three times a day (TID) | ORAL | Status: DC
  Administered 2016-01-09 – 2016-01-10 (×2): 100 mg via ORAL
  Filled 2016-01-09 (×3): qty 2

## 2016-01-09 MED ORDER — DILTIAZEM HCL ER COATED BEADS 180 MG PO CP24
180.0000 mg | ORAL_CAPSULE | Freq: Every day | ORAL | Status: DC
  Administered 2016-01-09 – 2016-01-11 (×2): 180 mg via ORAL
  Filled 2016-01-09 (×3): qty 1

## 2016-01-09 MED ORDER — LEVETIRACETAM 500 MG PO TABS
500.0000 mg | ORAL_TABLET | ORAL | Status: DC
  Administered 2016-01-09 – 2016-01-10 (×2): 500 mg via ORAL
  Filled 2016-01-09 (×3): qty 1

## 2016-01-09 MED ORDER — SODIUM CHLORIDE 0.9% FLUSH
3.0000 mL | Freq: Two times a day (BID) | INTRAVENOUS | Status: DC
  Administered 2016-01-09 – 2016-01-10 (×2): 3 mL via INTRAVENOUS

## 2016-01-09 MED ORDER — DOLUTEGRAVIR SODIUM 50 MG PO TABS
50.0000 mg | ORAL_TABLET | Freq: Every evening | ORAL | Status: DC
  Administered 2016-01-09: 50 mg via ORAL
  Filled 2016-01-09 (×3): qty 1

## 2016-01-09 MED ORDER — ABACAVIR SULFATE 300 MG PO TABS
600.0000 mg | ORAL_TABLET | Freq: Every day | ORAL | Status: DC
  Administered 2016-01-09: 600 mg via ORAL
  Filled 2016-01-09 (×3): qty 2

## 2016-01-09 MED ORDER — ACETAMINOPHEN 650 MG RE SUPP: 650.0000 mg | Freq: Four times a day (QID) | RECTAL | Status: DC | PRN

## 2016-01-09 MED ORDER — DARUNAVIR-COBICISTAT 800-150 MG PO TABS
1.0000 | ORAL_TABLET | Freq: Every evening | ORAL | Status: DC
  Administered 2016-01-09: 1 via ORAL
  Filled 2016-01-09 (×3): qty 1

## 2016-01-09 MED ORDER — AMITRIPTYLINE HCL 50 MG PO TABS
25.0000 mg | ORAL_TABLET | Freq: Every day | ORAL | Status: DC
  Administered 2016-01-09 – 2016-01-10 (×2): 25 mg via ORAL
  Filled 2016-01-09 (×2): qty 1

## 2016-01-09 NOTE — ED Notes (Signed)
Care handoff to Laruth Bouchard, RN

## 2016-01-09 NOTE — Consult Note (Signed)
Surgical Consultation Requesting provider: Dr. Audie Pinto  CC: hematochezia  HPI: This is a 35yo gentleman known to [place] s/p ileocecectomy for intussusception on 10/31. He was discharged home 5 days ago and presents today having had 4 blood-tinged bowel movements. This is associated with some dizziness. He has been eating alright and no issues like this before today, although he did have issues with bleeding from his wound immediately post-op; this has resolved. Last hemodialysis was yesterday.     Allergies  Allergen Reactions  . Bactrim [Sulfamethoxazole-Trimethoprim] Other (See Comments)    Renal failure  . Vancomycin Other (See Comments)    Patient states vancomycin caused kidney injury  . Reglan [Metoclopramide] Other (See Comments)    Causes ticks/ jerks  . Amlodipine Palpitations and Other (See Comments)    fatigue  . Augmentin [Amoxicillin-Pot Clavulanate] Hives    Past Medical History:  Diagnosis Date  . Asthma   . CAP (community acquired pneumonia) 10/2013  . Cellulitis 11/2013   LLE  . DVT (deep venous thrombosis) ([place]) 10/2013   BLE  . 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 Went on HD Dec 2016.   [name] Fever   . Headache   . HIV infection ([place]) dx'd 10/2013  . Hypertension   . Leg pain   . Multiple environmental allergies    "trees, dogs, cats"  . Multiple food allergies   . Shingles < 2010  . 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 AV FISTULA PLACEMENT Left 03/03/2015   Procedure: Creation of Left Arm RADIOCEPHALIC ARTERIOVENOUS FISTULA ;  Surgeon: Mal Misty, MD;  Location: [facility];  Service: Vascular;  Laterality: Left;  . FEMUR FRACTURE SURGERY Right 09/2001  . FRACTURE SURGERY    . I&D EXTREMITY Right 12/18/2014   Procedure: IRRIGATION AND DEBRIDEMENT RIGHT LOWER EXTREMITY;  Surgeon: Ralene Ok, MD;  Location: [facility];  Service: General;  Laterality:  Right;  . INSERTION OF DIALYSIS CATHETER  2016  . LAPAROTOMY N/A 12/28/2015   Procedure: EXPLORATORY LAPAROTOMY BOWEL RESECTION ILEOCECECTOMY;  Surgeon: Clovis Riley, MD;  Location: [facility];  Service: General;  Laterality: N/A;    Family History  Problem Relation Age of Onset  . Hypertension Mother   . Diabetes Maternal Grandmother   . Kidney disease Maternal Grandmother     Social History   Social History  . Marital status: Single    Spouse name: N/A  . Number of children: N/A  . Years of education: N/A   Social History Main Topics  . Smoking status: Never Smoker  . Smokeless tobacco: Never Used  . Alcohol use No     Comment: I stopped drinking along time ago "  . Drug use: No  . Sexual activity: Not Asked   Other Topics Concern  . None   Social History Narrative  . None    No current facility-administered medications on file prior to encounter.    Current Outpatient Prescriptions on File Prior to Encounter  Medication Sig Dispense Refill  . abacavir (ZIAGEN) 300 MG tablet Take 2 tablets (600 mg total) by mouth daily with supper. 60 tablet 11  . acetaminophen (TYLENOL) 500 MG tablet Take 1,000 mg by mouth every 6 (six) hours as needed (pain).    [name] albuterol (PROVENTIL HFA;VENTOLIN HFA) 108 (90 Base) MCG/ACT inhaler Inhale 2 puffs into the lungs every 6 (six) hours as needed  for wheezing or shortness of breath. 1 Inhaler 2  . amitriptyline (ELAVIL) 25 MG tablet Take 1 tablet (25 mg total) by mouth at bedtime. 60 tablet 0  . azithromycin (ZITHROMAX) 600 MG tablet Take 2 tablets (1,200 mg total) by mouth once a week. 30 tablet 0  . CARTIA XT 180 MG 24 hr capsule Take 180 mg by mouth daily.  3  . carvedilol (COREG) 25 MG tablet Take 25 mg by mouth 2 (two) times daily.  12  . dapsone 100 MG tablet Take 1 tablet (100 mg total) by mouth daily. 30 tablet 11  . darunavir-cobicistat (PREZCOBIX) 800-150 MG tablet Take 1 tablet by mouth every evening. Swallow whole. Do NOT  crush, break or chew tablets. Take with food. 30 tablet 11  . diphenhydrAMINE (BENADRYL) 25 MG tablet Take 25 mg by mouth every 4 (four) hours as needed for allergies.     [name] dolutegravir (TIVICAY) 50 MG tablet Take 1 tablet (50 mg total) by mouth every evening. 30 tablet 11  . hydrALAZINE (APRESOLINE) 100 MG tablet Take 100 mg by mouth 3 (three) times daily. 6:30am, 2:30pm, 11pm - for high blood pressure    . HYDROcodone-acetaminophen (NORCO/VICODIN) 5-325 MG tablet Take 1-2 tablets by mouth every 6 (six) hours as needed for moderate pain or severe pain. 15 tablet 0  . levETIRAcetam (KEPPRA) 250 MG tablet Take 1 tablet (250 mg total) by mouth See admin instructions. Take 1 tablet after your dialysis treatments. (Patient taking differently: Take 250 mg by mouth See admin instructions. Take 1 tablet (250 mg) by mouth on Tuesday, Thursday, Saturday at 7pm (dialysis days)) 30 tablet 0  . levETIRAcetam (KEPPRA) 500 MG tablet Take 500 mg by mouth See admin instructions. Take 1 tablet (500 mg) by mouth Sunday, Monday, Wednesday, Friday at 7pm (non-dialysis days)    . losartan (COZAAR) 100 MG tablet Take 100 mg by mouth at bedtime.    . ondansetron (ZOFRAN ODT) 8 MG disintegrating tablet Take 1 tablet (8 mg total) by mouth every 8 (eight) hours as needed for nausea or vomiting. 30 tablet 11  . sucroferric oxyhydroxide (VELPHORO) 500 MG chewable tablet Chew 500 mg by mouth See admin instructions. Chew 1 tablet (500 mg) by mouth after every meal (2-3 times daily)      Review of Systems: a complete, 10pt review of systems was completed with pertinent positives and negatives as documented in the HPI.   Physical Exam: Vitals:   01/09/16 1230 01/09/16 1300  BP: 132/75 112/67  Pulse: 117 110  Resp: 16 13  Temp:     Gen: A&Ox3, no distress  Head: normocephalic, atraumatic, EOMI, anicteric.  Neck: supple without mass or thyromegaly Chest: unlabored respirations   Cardiovascular: RRR with palpable distal  pulses Abdomen: soft, nondistended, mild right hemiabdominal tenderness. Midline wound with mild fibrinous slough but healing well.  Extremities: warm, without edema, no deformities  Neuro: grossly intact Psych: appropriate mood and affect   Labs from today are pending  CBC Latest Ref Rng & Units 01/04/2016 01/03/2016 01/02/2016  WBC 4.0 - 10.5 K/uL 5.3 4.5 4.6  Hemoglobin 13.0 - 17.0 g/dL 8.5(L) 8.3(L) 9.0(L)  Hematocrit 39.0 - 52.0 % 25.8(L) 25.6(L) 27.5(L)  Platelets 150 - 400 K/uL 118(L) 103(L) 99(L)    CMP Latest Ref Rng & Units 01/04/2016 01/03/2016 01/02/2016  Glucose 65 - 99 mg/dL 80 112(H) 81  BUN 6 - 20 mg/dL 67(H) 51(H) 32(H)  Creatinine 0.61 - 1.24 mg/dL 14.20(H) 11.44(H) 8.79(H)  Sodium 135 - 145 mmol/L 135 133(L) 134(L)  Potassium 3.5 - 5.1 mmol/L 4.3 3.8 4.0  Chloride 101 - 111 mmol/L 96(L) 95(L) 97(L)  CO2 22 - 32 mmol/L 24 28 26   Calcium 8.9 - 10.3 mg/dL 8.9 8.7(L) 8.7(L)  Total Protein 6.5 - 8.1 g/dL - - -  Total Bilirubin 0.3 - 1.2 mg/dL - - -  Alkaline Phos 38 - 126 U/L - - -  AST 15 - 41 U/L - - -  ALT 17 - 63 U/L - - -    Lab Results  Component Value Date   INR 1.19 12/29/2015   INR 1.10 07/07/2015   INR 1.0 02/05/2014   Surgical Pathology: Colon, segmental resection, Ileocecetomy - BENIGN SMALL BOWEL AND COLONIC TYPE MUCOSA WITH EDEMATOUS SUBMUCOSA. - SEROSITIS. - ISCHEMIC TYPE CHANGES. - MILDLY INFLAMED APPENDIX. - FOUR BENIGN LYMPH NODES (0/4). - THE SURGICAL RESECTION MARGINS ARE HISTOLOGICALLY VIABLE. Imaging: n/a  A/P: Hematochezia 2 weeks s/p open ileocecectomy for intussusception. I suspect that the anastomosis has sloughed given timeframe after surgery and hopefully will self-resolve. Recommend admission for serial exams/labs, supportive care. He may require endoscopic intervention if the bleeding continues.   Given his bleeding issues intra- and peri-op and now, it may be worth considering to assess his platelet function/coagulopathy panel.  He doesn't note easy bruising but does on occasion have gum bleeding.    Romana Juniper, MD [facility], [place] Pager [phone]

## 2016-01-09 NOTE — ED Provider Notes (Signed)
[facility] DEPT Provider Note   CSN: [id number] Arrival date & time: 01/09/16  1202     History   Chief Complaint Chief Complaint  Patient presents with  . Blood In Stools    HPI Jim Wyatt is a 35 y.o. male.  HPI Pt was here on 10/31 and had abd surgery. reprots having blood in stools and abd pain today. Last dialysis was Saturday. Past Medical History:  Diagnosis Date  . Asthma   . CAP (community acquired pneumonia) 10/2013  . Cellulitis 11/2013   LLE  . DVT (deep venous thrombosis) ([place]) 10/2013   BLE  . 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 Went on HD Dec 2016.   [name] Fever   . Headache   . HIV infection ([place]) dx'd 10/2013  . Hypertension   . Leg pain   . Multiple environmental allergies    "trees, dogs, cats"  . Multiple food allergies   . Shingles < 2010  . 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 Dysuria 01/18/2016  . Hematochezia 01/09/2016  . Postoperative anemia due to acute blood loss 01/09/2016  . Seizure disorder ([place]) 12/29/2015  . Intussusception ([place]) 12/28/2015  . Hypertension 12/28/2015  . Otitis externa of both ears 11/21/2015  . AIDS (acquired immunodeficiency syndrome), CD4 <=200 ([place])   . Meningitis   . Cephalalgia   . Blurry vision, bilateral   . Headache   . Hypertensive urgency   . New onset of headache in immunocompromised patient ([place]) 07/06/2015  . ESRD on dialysis ([place]) 04/27/2015  . CAP (community acquired pneumonia) 02/25/2015  . ESRD (end stage renal disease) ([place]) 02/16/2015  . History of DVT (deep vein thrombosis) 01/12/2015  . Candidal esophagitis ([place])   . Constipation   . Nausea without vomiting 12/20/2014  . Abscess of right thigh 12/19/2014  . Chronic kidney disease (CKD), stage IV (severe) ([place]) 12/19/2014  . 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 Anemia due to end stage renal disease ([place])  12/19/2014  . Hyponatremia 12/19/2014  . Community acquired pneumonia 07/30/2014  . Anemia of chronic disease 07/30/2014  . Acute on chronic renal failure ([place])   . 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 Human immunodeficiency virus (HIV) disease ([place]) 12/11/2013  . CMV (cytomegalovirus infection) ([place]) 12/07/2013  . Protein-calorie malnutrition, severe ([place]) 12/06/2013  . 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 DVT (deep venous thrombosis) ([place]) 11/24/2013  . Asthma, chronic 11/24/2013  . Acquired immune deficiency syndrome ([place]) 11/14/2013  . AKI (acute kidney injury) ([place]) 11/11/2013  . Elevated liver enzymes 11/09/2013  . History of shingles 11/09/2013  . Obesity 11/09/2013  . Asthma     Past Surgical History:  Procedure Laterality Date  . AV FISTULA PLACEMENT Left 03/03/2015   Procedure: Creation of Left Arm RADIOCEPHALIC ARTERIOVENOUS FISTULA ;  Surgeon: Mal Misty, MD;  Location: [facility];  Service: Vascular;  Laterality: Left;  . FEMUR FRACTURE SURGERY Right 09/2001  . FRACTURE SURGERY    . I&D EXTREMITY Right 12/18/2014   Procedure: IRRIGATION AND DEBRIDEMENT RIGHT LOWER EXTREMITY;  Surgeon: Ralene Ok, MD;  Location: [facility];  Service: General;  Laterality: Right;  . INSERTION OF DIALYSIS CATHETER  2016  . LAPAROTOMY N/A 12/28/2015   Procedure: EXPLORATORY LAPAROTOMY BOWEL RESECTION ILEOCECECTOMY;  Surgeon: Clovis Riley, MD;  Location: [facility];  Service: General;  Laterality: N/A;       Home Medications    Prior to Admission medications   Medication Sig Start Date End Date Taking? Authorizing Provider  acetaminophen (TYLENOL) 500 MG tablet Take 1,000 mg by mouth every 6 (six) hours as needed (pain).   Yes Historical Provider, MD  albuterol (PROVENTIL HFA;VENTOLIN HFA) 108 (90 Base) MCG/ACT inhaler Inhale 2 puffs into the lungs every 6 (six) hours as needed for wheezing or shortness of breath. 01/04/16  Yes Asencion Partridge, MD  Ascorbic Acid (VITAMIN C) 250 MG CHEW Chew 250 mg by mouth daily.   Yes Historical Provider, MD  Cyanocobalamin 2500 MCG CHEW Chew 5,000 mcg by mouth daily.   Yes Historical Provider, MD  sucroferric oxyhydroxide (VELPHORO) 500 MG chewable tablet Chew 500 mg by mouth See admin instructions. Chew 1 tablet (500 mg) by mouth after every meal (2-3 times daily)   Yes Historical Provider, MD  abacavir (ZIAGEN) 300 MG tablet Take 2 tablets (600 mg total) by mouth daily with supper. 12/14/15   Carlyle Basques, MD  azithromycin (ZITHROMAX) 600 MG tablet Take 2 tablets (1,200 mg total) by mouth once a week. On Mondays 01/11/16   Asencion Partridge, MD  CARTIA XT 180 MG 24 hr capsule Take 180 mg by mouth daily. 12/11/15   Historical Provider, MD  carvedilol (COREG) 25 MG tablet Take 25 mg by mouth 2 (two) times daily. 12/02/15   Historical Provider, MD  dapsone 100 MG tablet Take 1 tablet (100 mg total) by mouth daily. 11/24/15   Alexa Angela Burke, MD  darunavir-cobicistat (PREZCOBIX) 800-150 MG tablet Take 1 tablet by mouth every evening. Swallow whole. Do NOT crush, break or chew tablets. Take with food. 12/14/15   Carlyle Basques, MD  dolutegravir (TIVICAY) 50 MG tablet Take 1 tablet (50 mg total) by mouth every evening. 12/14/15   Carlyle Basques, MD  hydrALAZINE (APRESOLINE) 100 MG tablet Take 0.5 tablets (50 mg total) by mouth 2 (two) times daily. 6:30am, 2:30pm, 11pm - for high blood pressure 01/11/16   Asencion Partridge, MD  HYDROcodone-acetaminophen (NORCO/VICODIN) 5-325 MG tablet Take 1-2 tablets by mouth every 6 (six) hours as needed for moderate pain or severe pain. 01/04/16   Asencion Partridge, MD  levETIRAcetam (KEPPRA) 250 MG tablet Take 1 tablet (250 mg total) by mouth See admin instructions. Take 1 tablet after your dialysis treatments. Patient taking differently: Take 250 mg by mouth See admin instructions. Take 1 tablet (250 mg) by mouth on Tuesday, Thursday, Saturday at 7pm (dialysis days) 11/05/15   Nona Dell, PA-C  levETIRAcetam (KEPPRA) 500 MG tablet Take 500 mg by mouth See admin instructions. Take 1 tablet (500 mg) by mouth Sunday, Monday, Wednesday, Friday at 7pm (non-dialysis days)    Historical Provider, MD  losartan (COZAAR) 100 MG tablet Take 100 mg by mouth at bedtime.    Historical Provider, MD  ondansetron (ZOFRAN ODT) 8 MG disintegrating tablet Take 1 tablet (8 mg total) by mouth every 8 (eight) hours as needed for nausea or vomiting. 10/06/15   Carlyle Basques, MD    Family History Family History  Problem Relation Age of Onset  . Hypertension Mother   . Diabetes Maternal Grandmother   . Kidney disease Maternal Grandmother     Social History Social History  Substance Use Topics  . Smoking status: Never Smoker  . Smokeless tobacco: Never Used  . Alcohol use No     Comment: I stopped drinking along time ago "  Allergies   Bactrim [sulfamethoxazole-trimethoprim]; Vancomycin; Reglan [metoclopramide]; Amlodipine; and Augmentin [amoxicillin-pot clavulanate]   Review of Systems Review of Systems  Constitutional: Negative for fever.  Gastrointestinal: Positive for blood in stool and nausea.  All other systems reviewed and are negative.    Physical Exam Updated Vital Signs BP 113/62 (BP Location: Right Arm)   Pulse (!) 104   Temp 98.6 F (37 C) (Oral)   Resp 18   Ht 5\' 7"  (1.702 m)   Wt 145 lb 8.1 oz (66 kg)   SpO2 92%   BMI 22.79 kg/m   Physical Exam Physical Exam  Nursing note and vitals reviewed. Constitutional: He is oriented to person, place, and time. He appears well-developed and well-nourished. No distress.  HENT:  Head: Normocephalic and atraumatic.  Eyes: Pupils are equal, round, and reactive to light.  Neck: Normal range of motion.  Cardiovascular: Normal rate and intact distal pulses.   Pulmonary/Chest: No respiratory distress.  Abdominal: Normal appearance. He exhibits no distension.  Diffuse abdominal tenderness with no rebound  or guarding.  Active bowel sounds.   Musculoskeletal: Normal range of motion.  Neurological: He is alert and oriented to person, place, and time. No cranial nerve deficit.  Skin: Skin is warm and dry. No rash noted.  Psychiatric: He has a normal mood and affect. His behavior is normal.    ED Treatments / Results  Labs (all labs ordered are listed, but only abnormal results are displayed) Labs Reviewed  COMPREHENSIVE METABOLIC PANEL - Abnormal; Notable for the following:       Result Value   Chloride 97 (*)    Glucose, Bld 137 (*)    BUN 36 (*)    Creatinine, Ser 8.83 (*)    Calcium 8.7 (*)    Total Protein 6.2 (*)    Albumin 2.0 (*)    AST 55 (*)    ALT 8 (*)    Alkaline Phosphatase 30 (*)    GFR calc non Af Amer 7 (*)    GFR calc Af Amer 8 (*)    All other components within normal limits  CBC - Abnormal; Notable for the following:    RBC 2.04 (*)    Hemoglobin 5.9 (*)    HCT 18.3 (*)    RDW 15.8 (*)    All other components within normal limits  COMPREHENSIVE METABOLIC PANEL - Abnormal; Notable for the following:    Chloride 99 (*)    BUN 45 (*)    Creatinine, Ser 10.33 (*)    Calcium 8.2 (*)    Total Protein 5.8 (*)    Albumin 1.9 (*)    AST 45 (*)    ALT 6 (*)    Alkaline Phosphatase 35 (*)    GFR calc non Af Amer 6 (*)    GFR calc Af Amer 7 (*)    All other components within normal limits  CBC - Abnormal; Notable for the following:    RBC 2.48 (*)    Hemoglobin 7.2 (*)    HCT 21.9 (*)    RDW 15.9 (*)    All other components within normal limits  OCCULT BLOOD X 1 CARD TO LAB, STOOL - Abnormal; Notable for the following:    Fecal Occult Bld POSITIVE (*)    All other components within normal limits  CBC - Abnormal; Notable for the following:    RBC 2.43 (*)    Hemoglobin 6.9 (*)    HCT 21.3 (*)  RDW 15.8 (*)    All other components within normal limits  APTT - Abnormal; Notable for the following:    aPTT 37 (*)    All other components within normal  limits  BASIC METABOLIC PANEL - Abnormal; Notable for the following:    Chloride 98 (*)    BUN 60 (*)    Creatinine, Ser 12.48 (*)    Calcium 8.3 (*)    GFR calc non Af Amer 5 (*)    GFR calc Af Amer 5 (*)    All other components within normal limits  CBC - Abnormal; Notable for the following:    RBC 2.81 (*)    Hemoglobin 8.1 (*)    HCT 24.6 (*)    RDW 15.6 (*)    All other components within normal limits  HEMOGLOBIN AND HEMATOCRIT, BLOOD - Abnormal; Notable for the following:    Hemoglobin 7.7 (*)    HCT 22.9 (*)    All other components within normal limits  GLUCOSE, CAPILLARY  PROTIME-INR  POC OCCULT BLOOD, ED  TYPE AND SCREEN  PREPARE RBC (CROSSMATCH)  PREPARE RBC (CROSSMATCH)    EKG  EKG Interpretation None       Radiology No results found. Results for orders placed or performed during the hospital encounter of 01/09/16  Comprehensive metabolic panel  Result Value Ref Range   Sodium 137 135 - 145 mmol/L   Potassium 3.8 3.5 - 5.1 mmol/L   Chloride 97 (L) 101 - 111 mmol/L   CO2 30 22 - 32 mmol/L   Glucose, Bld 137 (H) 65 - 99 mg/dL   BUN 36 (H) 6 - 20 mg/dL   Creatinine, Ser 8.83 (H) 0.61 - 1.24 mg/dL   Calcium 8.7 (L) 8.9 - 10.3 mg/dL   Total Protein 6.2 (L) 6.5 - 8.1 g/dL   Albumin 2.0 (L) 3.5 - 5.0 g/dL   AST 55 (H) 15 - 41 U/L   ALT 8 (L) 17 - 63 U/L   Alkaline Phosphatase 30 (L) 38 - 126 U/L   Total Bilirubin 0.5 0.3 - 1.2 mg/dL   GFR calc non Af Amer 7 (L) >60 mL/min   GFR calc Af Amer 8 (L) >60 mL/min   Anion gap 10 5 - 15  CBC  Result Value Ref Range   WBC 4.6 4.0 - 10.5 K/uL   RBC 2.04 (L) 4.22 - 5.81 MIL/uL   Hemoglobin 5.9 (LL) 13.0 - 17.0 g/dL   HCT 18.3 (L) 39.0 - 52.0 %   MCV 89.7 78.0 - 100.0 fL   MCH 28.9 26.0 - 34.0 pg   MCHC 32.2 30.0 - 36.0 g/dL   RDW 15.8 (H) 11.5 - 15.5 %   Platelets 178 150 - 400 K/uL  Comprehensive metabolic panel  Result Value Ref Range   Sodium 138 135 - 145 mmol/L   Potassium 4.4 3.5 - 5.1 mmol/L    Chloride 99 (L) 101 - 111 mmol/L   CO2 28 22 - 32 mmol/L   Glucose, Bld 76 65 - 99 mg/dL   BUN 45 (H) 6 - 20 mg/dL   Creatinine, Ser 10.33 (H) 0.61 - 1.24 mg/dL   Calcium 8.2 (L) 8.9 - 10.3 mg/dL   Total Protein 5.8 (L) 6.5 - 8.1 g/dL   Albumin 1.9 (L) 3.5 - 5.0 g/dL   AST 45 (H) 15 - 41 U/L   ALT 6 (L) 17 - 63 U/L   Alkaline Phosphatase 35 (L) 38 - 126 U/L  Total Bilirubin 0.5 0.3 - 1.2 mg/dL   GFR calc non Af Amer 6 (L) >60 mL/min   GFR calc Af Amer 7 (L) >60 mL/min   Anion gap 11 5 - 15  CBC  Result Value Ref Range   WBC 6.4 4.0 - 10.5 K/uL   RBC 2.48 (L) 4.22 - 5.81 MIL/uL   Hemoglobin 7.2 (L) 13.0 - 17.0 g/dL   HCT 21.9 (L) 39.0 - 52.0 %   MCV 88.3 78.0 - 100.0 fL   MCH 29.0 26.0 - 34.0 pg   MCHC 32.9 30.0 - 36.0 g/dL   RDW 15.9 (H) 11.5 - 15.5 %   Platelets 172 150 - 400 K/uL  Occult blood card to lab, stool  Result Value Ref Range   Fecal Occult Bld POSITIVE (A) NEGATIVE  CBC  Result Value Ref Range   WBC 5.9 4.0 - 10.5 K/uL   RBC 2.43 (L) 4.22 - 5.81 MIL/uL   Hemoglobin 6.9 (LL) 13.0 - 17.0 g/dL   HCT 21.3 (L) 39.0 - 52.0 %   MCV 87.7 78.0 - 100.0 fL   MCH 28.4 26.0 - 34.0 pg   MCHC 32.4 30.0 - 36.0 g/dL   RDW 15.8 (H) 11.5 - 15.5 %   Platelets 193 150 - 400 K/uL  Glucose, capillary  Result Value Ref Range   Glucose-Capillary 77 65 - 99 mg/dL  APTT  Result Value Ref Range   aPTT 37 (H) 24 - 36 seconds  Protime-INR  Result Value Ref Range   Prothrombin Time 13.0 11.4 - 15.2 seconds   INR 6.96   Basic metabolic panel  Result Value Ref Range   Sodium 137 135 - 145 mmol/L   Potassium 5.1 3.5 - 5.1 mmol/L   Chloride 98 (L) 101 - 111 mmol/L   CO2 27 22 - 32 mmol/L   Glucose, Bld 89 65 - 99 mg/dL   BUN 60 (H) 6 - 20 mg/dL   Creatinine, Ser 12.48 (H) 0.61 - 1.24 mg/dL   Calcium 8.3 (L) 8.9 - 10.3 mg/dL   GFR calc non Af Amer 5 (L) >60 mL/min   GFR calc Af Amer 5 (L) >60 mL/min   Anion gap 12 5 - 15  CBC  Result Value Ref Range   WBC 6.6 4.0 - 10.5  K/uL   RBC 2.81 (L) 4.22 - 5.81 MIL/uL   Hemoglobin 8.1 (L) 13.0 - 17.0 g/dL   HCT 24.6 (L) 39.0 - 52.0 %   MCV 87.5 78.0 - 100.0 fL   MCH 28.8 26.0 - 34.0 pg   MCHC 32.9 30.0 - 36.0 g/dL   RDW 15.6 (H) 11.5 - 15.5 %   Platelets 200 150 - 400 K/uL  Hemoglobin and hematocrit, blood  Result Value Ref Range   Hemoglobin 7.7 (L) 13.0 - 17.0 g/dL   HCT 22.9 (L) 39.0 - 52.0 %  Type and screen [facility]  Result Value Ref Range   ABO/RH(D) O POS    Antibody Screen NEG    Sample Expiration 01/12/2016    Unit Number [id number]    Blood Component Type RED CELLS,LR    Unit division 00    Status of Unit ISSUED,FINAL    Transfusion Status OK TO TRANSFUSE    Crossmatch Result Compatible    Unit Number [id number]    Blood Component Type RED CELLS,LR    Unit division 00    Status of Unit ISSUED,FINAL    Transfusion Status OK TO  TRANSFUSE    Crossmatch Result Compatible    Unit Number [id number]    Blood Component Type RED CELLS,LR    Unit division 00    Status of Unit REL FROM [facility]    Transfusion Status OK TO TRANSFUSE    Crossmatch Result Compatible    Unit Number [id number]    Blood Component Type RED CELLS,LR    Unit division 00    Status of Unit ISSUED,FINAL    Transfusion Status OK TO TRANSFUSE    Crossmatch Result Compatible   Prepare RBC  Result Value Ref Range   Order Confirmation ORDER PROCESSED BY BLOOD BANK   Prepare RBC  Result Value Ref Range   Order Confirmation      ORDER PROCESSED BY BLOOD BANK BLOOD ALREADY AVAILABLE   Ct Abdomen Pelvis Wo Wyatt  Result Date: 12/28/2015 CLINICAL DATA:  Abdominal pain starting during dialysis today. Hypertension. EXAM: CT ABDOMEN AND PELVIS WITHOUT Wyatt TECHNIQUE: Multidetector CT imaging of the abdomen and pelvis was performed following the standard protocol without IV Wyatt. COMPARISON:  Multiple exams, including the 11/07/2013 FINDINGS: Lower chest: Mild to moderate cardiomegaly.  Hepatobiliary: Unremarkable Pancreas: Unremarkable Spleen: Unremarkable Adrenals/Urinary Tract: Unremarkable Stomach/Bowel: Suspected ileocolic intussusception, about 5.5 cm in length, with dilated right colon at about 6.3 cm diameter. Possible bowel wall thickening and irregularity in this region. I do not see dilated proximal loops of small bowel. Appendix not visible. Vascular/Lymphatic: No significant atherosclerotic calcification. Indistinctness of tissue planes around the aorta and IVC. Inguinal lymph nodes up to about 1.3 cm in short axis. Reduced sensitivity for adenopathy. Reproductive: Unremarkable Other: Pelvic ascites. Ascites in the right paracolic gutter. Diffuse edema in the subcutaneous and mesenteric tissues which in conjunction with the lack of oral and IV Wyatt causes poor definition of structures intra-abdominally. 1.9 by 3.9 by 2.8 cm area of accentuated density in the upper midline subcutaneous tissues, probably a small herniation of omentum. Intramedullary nail in the right femur only partially seen. Musculoskeletal: Unremarkable IMPRESSION: 1. Dilated and potentially inflamed loop of bowel in the right abdomen is thought to be colon, and there appears to be incorporation of adipose tissue into the center of this structure strongly favoring ileocolic intussusception. 2. Because of the severity of fluid infiltration in the mesentery and fatty tissues, as well as the lack of oral and IV Wyatt, this abnormality in the right abdomen is not entirely specific. I do favor intussusception. The fluid infiltration could be due to hypoproteinemia/hypoalbuminemia or right heart failure. 3. Suspected small ventral upper abdominal hernia containing omental adipose tissue. 4. Mild to moderate enlargement of the heart. 5. Ascites in the right paracolic gutter and a small amount of pelvic ascites. Electronically Signed   By: Van Clines M.D.   On: 12/28/2015 14:20   Dg Chest 2 View  Result  Date: 12/28/2015 CLINICAL DATA:  35 year old male with generalized abdominal pain and shortness of breath after 2 hours of dialysis EXAM: CHEST  2 VIEW COMPARISON:  Prior chest x-ray 07/06/2015 FINDINGS: Stable cardiomegaly. Mediastinal contours are within normal limits. Mild scratch then no significant vascular congestion, pulmonary edema, pleural effusion or pneumothorax. No evidence of free air under the diaphragm. No focal airspace consolidation. Osseous structures are intact and unremarkable. The previously noted right IJ approach tunneled hemodialysis catheter has been removed. IMPRESSION: 1. Stable cardiomegaly. 2. No evidence of edema or other acute cardiopulmonary process. Electronically Signed   By: Jacqulynn Cadet M.D.   On: 12/28/2015 13:10   Jim  Brain Wo Wyatt  Result Date: 01/17/2016 CLINICAL DATA:  35 year old male with a single seizure episode on 11/05/2015. HIV. Numbness in the left fourth and fifth fingers. Initial encounter. EXAM: MRI HEAD WITHOUT Wyatt TECHNIQUE: Multiplanar, multiecho pulse sequences of the brain and surrounding structures were obtained without intravenous Wyatt. COMPARISON:  CT face 11/22/2015. Head CT without Wyatt 11/05/2015. Brain MRI 07/08/2015. FINDINGS: Brain: Generalized cerebral volume loss for age appears mildly progressed since May. No restricted diffusion to suggest acute infarction. No midline shift, mass effect, evidence of mass lesion, ventriculomegaly, extra-axial collection or acute intracranial hemorrhage. Cervicomedullary junction and pituitary are within normal limits. T2 and FLAIR hyperintensity and swelling of the bilateral caudate and putamen has regressed since May (series 10, image 15 today versus series 6, image 15 previously), but does not appear completely resolved. No cortical encephalomalacia. No chronic cerebral blood products. Thin slice coronal T2 weighted images do reveal asymmetric T2 hyperintensity of the left hippocampus,  which is also apparent on coronal trace diffusion (series 15, image 10 and series 8, image 42). The left hippocampus appears slightly larger than the right. The dentate gyrus remains visible. Other temporal lobe structures appear normal. Only minimal nonspecific scattered small areas of cerebral white matter T2 or FLAIR hyperintensity are noted. Globus pallidus, thalami I, brainstem, and cerebellum signal remains normal. Vascular: Major intracranial vascular flow voids are stable and within normal limits. Skull and upper cervical spine: Negative. Stable visible bone marrow signal. Sinuses/Orbits: Negative orbits soft tissues. Similar moderate maxillary sinus mucosal thickening compared to May. Chronic sphenoid sinusitis greater on the left is stable. Other: Right tympanic cavity and right external auditory canal pneumatization appears normalized since the September face CT. Visible internal auditory structures appear normal. Mastoids remain clear. Superficial right periauricular soft tissue inflammation appears resolved. Negative scalp soft tissues. Stable visible upper cervical lymph nodes. IMPRESSION: 1. Mild asymmetric T2 hyperintensity and swelling of the left hippocampus. This may reflect recent seizure activity. No other acute signal abnormality in the brain. 2. Regressed but not completely resolved bilateral caudate and putamen T2 hyperintensity and swelling since the MRI on 07/08/2015 (please see also that report). 3. 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 4. Resolved right periauricular, right EAC and right tympanic cavity inflammatory changes since 11/22/2015. Electronically Signed   By: Genevie Ann M.D.   On: 01/17/2016 08:56   Dg Chest Port 1 View  Result Date: 01/01/2016 CLINICAL DATA:  Acute onset of fever and tachypnea. Initial encounter. EXAM: PORTABLE CHEST 1 VIEW COMPARISON:  Chest radiograph performed  12/28/2015 FINDINGS: The lungs are well-aerated. Minimal bilateral opacities may reflect atelectasis or mild infection. There is no evidence of pleural effusion or pneumothorax. The cardiomediastinal silhouette is mildly enlarged. No acute osseous abnormalities are seen. IMPRESSION: Mild cardiomegaly. Minimal bilateral opacities may reflect atelectasis or mild infection. Electronically Signed   By: Garald Balding M.D.   On: 01/01/2016 21:48    Procedures Procedures (including critical care time)  Medications Ordered in ED Medications  0.9 %  sodium chloride infusion (250 mLs Intravenous New Bag/Given 01/10/16 1538)     Initial Impression / Assessment and Plan / ED Course  I have reviewed the triage vital signs and the nursing notes.  Pertinent labs & imaging results that were available during my care of the patient were reviewed by me and considered in my medical decision making (see chart for details).  Clinical Course   Discussed  with the general surgeon, Dr. Windle Guard, who was come see the patient in the emergency room. Surgery recommends admission to medical service.  Wall call hospitalist for admission   Final Clinical Impressions(s) / ED Diagnoses   Final diagnoses:  None    New Prescriptions Discharge Medication List as of 01/11/2016  4:43 PM       Leonard Schwartz, MD 01/26/16 [phone]

## 2016-01-09 NOTE — H&P (Signed)
Date: 01/09/2016               Patient Name:  Jim Wyatt MRN: [id number]  DOB: 13-Oct-1980 Age / Sex: 35 y.o., male   PCP: [name], MD         Medical Service: Internal Medicine Teaching Service         Attending Physician: Dr. Joni Reining    First Contact: Dr. Asencion Partridge Pager: [phone]  Second Contact: Dr. Burgess Estelle Pager: [phone]       After Hours (After 5p/  First Contact Pager: [phone]  weekends / holidays): Second Contact Pager: [phone]   Chief Complaint: Hematochezia   History of Present Illness: Jim Wyatt is a 35 y.o. gentleman with PMH recent ileocecectomy for intussusception, HIV/AIDS (CD4 10 on 01/03/16), ESRD on HD, HTN who presents for blood in his bowel movements. He was discharged five days ago from our service after recovering from this surgery and post-op bleeding. He reports ongoing RLQ abdominal pain since discharge requiring Norco Q4-6 hrs that had nearly resolved by yesterday. Yesterday his pain was improved (requiring only prn Tylenol) but he noticed progressive fatigue and some shortness of breath. He slept throughout his dialysis session yesterday and went straight to bed when he got home, forgetting to take all of his medications. He awoke last night around midnight with tenesmus, and had a large bloody stool. This occurred 3 additional times throughout the morning, seeming to progressively darker, and he resolved to travel to our ED. His RLQ feels similar to previous, and he endorses dizziness, tachycardia, and mild shortness of breath today. He reports having intermittent dark tarry stool this past week and even during his previous admission, but the significance of this is unclear given his stable/improving Hb upon discharge and reported Hb improving to 11 when checked at dialysis this week. He denies chest pain, NSAID use, or new abdominal pain. He reports full compliance with his medications this week with yesterday being the  exception.  In the ED he was afebrile, HR 125, RR 20, BP 148/92, 97% on RA, with generalized abdominal pain on exam. Labs remarkable for Hb 5.9, platelets and WBC stable CMP similar to previous. Surgery consulted and felt that this could represent anastomosis sloughing. Orders were placed to transfuse 2 units pRBCs.  Meds:  Current Meds  Medication Sig  . acetaminophen (TYLENOL) 500 MG tablet Take 1,000 mg by mouth every 6 (six) hours as needed (pain).  [name] albuterol (PROVENTIL HFA;VENTOLIN HFA) 108 (90 Base) MCG/ACT inhaler Inhale 2 puffs into the lungs every 6 (six) hours as needed for wheezing or shortness of breath.  . Ascorbic Acid (VITAMIN C) 250 MG CHEW Chew 250 mg by mouth daily.  [name] azithromycin (ZITHROMAX) 600 MG tablet Take 2 tablets (1,200 mg total) by mouth once a week.  . Cyanocobalamin 2500 MCG CHEW Chew 5,000 mcg by mouth daily.  . sucroferric oxyhydroxide (VELPHORO) 500 MG chewable tablet Chew 500 mg by mouth See admin instructions. Chew 1 tablet (500 mg) by mouth after every meal (2-3 times daily)    Allergies: Allergies as of 01/09/2016 - Review Complete 01/09/2016  Allergen Reaction Noted  . Bactrim [sulfamethoxazole-trimethoprim] Other (See Comments) 11/14/2013  . Vancomycin Other (See Comments) 12/04/2013  . Reglan [metoclopramide] Other (See Comments) 11/21/2015  . Amlodipine Palpitations and Other (See Comments) 01/12/2015  . Augmentin [amoxicillin-pot clavulanate] Hives 09/03/2013   Past Medical History:  Diagnosis Date  . Asthma   .  CAP (community acquired pneumonia) 10/2013  . Cellulitis 11/2013   LLE  . DVT (deep venous thrombosis) ([place]) 10/2013   BLE  . 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 Went on HD Dec 2016.   [name] Fever   . Headache   . HIV infection ([place]) dx'd 10/2013  . Hypertension   . Leg pain   . Multiple environmental allergies    "trees, dogs, cats"  . Multiple food allergies   . Shingles <  2010  . 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 Hypertension Mother   . Diabetes Maternal Grandmother   . Kidney disease Maternal Grandmother     Social History:  Social History   Social History  . Marital status: Single    Spouse name: N/A  . Number of children: N/A  . Years of education: N/A   Occupational History  . Not on file.   Social History Main Topics  . Smoking status: Never Smoker  . Smokeless tobacco: Never Used  . Alcohol use No     Comment: I stopped drinking along time ago "  . Drug use: No  . Sexual activity: Not on file   Other Topics Concern  . Not on file   Social History Narrative  . No narrative on file    Review of Systems: A complete ROS was negative except as per HPI.   Physical Exam: Blood pressure 130/80, pulse 114, temperature 98.8 F (37.1 C), temperature source Oral, resp. rate 17, height 5\' 7"  (1.702 m), weight 64.9 kg (143 lb 1.6 oz), SpO2 100 %.  General appearance: Tired-appearing gentleman resting comfortably in bed, in no distress HENT: Normocephalic, atraumatic, moist mucous membranes Eyes: PERRL, EOM inact, non-icteric Cardiovascular: Tachyardic rate and regular rhythm, hyperdynamic precordium, no murmurs, rubs, gallops Respiratory: Clear to auscultation bilaterally, normal work of breathing Abdomen: BS+, soft, mild generalized tenderness to palpation, worst in RLQ , non-distended, midline bandaged incision clean/dry/intact Extremities: Normal bulk and range of motion, no edema, bounding peripheral pulses, palpable thrill of left radial artery Skin: Warm, dry, intact Neuro: Alert and oriented, cranial nerves grossly intact Psych: Appropriate affect, clear speech, thoughts linear and goal-directed  Labs: CBC Latest Ref Rng & Units 01/09/2016 01/04/2016 01/03/2016  WBC 4.0 - 10.5 K/uL 4.6 5.3 4.5  Hemoglobin 13.0 - 17.0 g/dL 5.9(LL) 8.5(L) 8.3(L)    Hematocrit 39.0 - 52.0 % 18.3(L) 25.8(L) 25.6(L)  Platelets 150 - 400 K/uL 178 118(L) 103(L)   Surgical Pathology: Colon, segmental resection, Ileocecetomy - BENIGN SMALL BOWEL AND COLONIC TYPE MUCOSA WITH EDEMATOUS SUBMUCOSA. - SEROSITIS. - ISCHEMIC TYPE CHANGES. - MILDLY INFLAMED APPENDIX. - FOUR BENIGN LYMPH NODES (0/4). - THE SURGICAL RESECTION MARGINS ARE HISTOLOGICALLY VIABLE.  Assessment & Plan by Problem: Mr. Guevara is a 35 yo gentleman with AIDS/HIV, ESRD on HD, HTN, recent ileocecectomy for intussusception presenting with hematochezia and blood-loss anemia.   Hematochezia, with 4x bloody BMs this morning, Hb 5.9 from 8.5 at discharge 5 days ago, anastomosis sloughing vs IRIS-related vs mucosal kaposi sarcoma in setting of AIDS/HIV - Appreciate surgery recs - 2 units pRBCs  - POC FOBT - Check coag panel - Follow post-transfusion and daily CBCs - Monitor for abdominal exam changes - May require EGD and coloscopy depending on Hb trend  ESRD on HD, TTS dialysis, will require as inpatient - consult renal tomorrow  AIDS/HIV,  last CD4 count of 10 on 01/03/2016, viral load pending - Continue home ART - Dapsone PCP ppx - Azithro weekly ppx HTN - continue home medications Seizures - hold keppra, only received on dialysis days  FEN/GI: Renal diet, replete electrolytes as needed  DVT ppx: Lovenox  Code status: Full  Dispo: Admit patient to Observation with expected length of stay less than 2 midnights.  Signed: Asencion Partridge, MD 01/09/2016, 3:46 PM  Pager: [phone]

## 2016-01-09 NOTE — ED Notes (Signed)
Pt eating ice intermittently

## 2016-01-09 NOTE — ED Notes (Signed)
Report called to rn on 5 w 

## 2016-01-09 NOTE — ED Triage Notes (Signed)
Pt was here on 10/31 and had abd surgery. reprots having blood in stools and abd pain today. Last dialysis was Saturday.

## 2016-01-10 LAB — COMPREHENSIVE METABOLIC PANEL
ALT: 6 U/L — ABNORMAL LOW (ref 17–63)
AST: 45 U/L — ABNORMAL HIGH (ref 15–41)
Albumin: 1.9 g/dL — ABNORMAL LOW (ref 3.5–5.0)
Alkaline Phosphatase: 35 U/L — ABNORMAL LOW (ref 38–126)
Anion gap: 11 (ref 5–15)
BUN: 45 mg/dL — ABNORMAL HIGH (ref 6–20)
CO2: 28 mmol/L (ref 22–32)
Calcium: 8.2 mg/dL — ABNORMAL LOW (ref 8.9–10.3)
Chloride: 99 mmol/L — ABNORMAL LOW (ref 101–111)
Creatinine, Ser: 10.33 mg/dL — ABNORMAL HIGH (ref 0.61–1.24)
GFR calc Af Amer: 7 mL/min — ABNORMAL LOW (ref >60)
GFR calc non Af Amer: 6 mL/min — ABNORMAL LOW (ref >60)
Glucose, Bld: 76 mg/dL (ref 65–99)
Potassium: 4.4 mmol/L (ref 3.5–5.1)
Sodium: 138 mmol/L (ref 135–145)
Total Bilirubin: 0.5 mg/dL (ref 0.3–1.2)
Total Protein: 5.8 g/dL — ABNORMAL LOW (ref 6.5–8.1)

## 2016-01-10 LAB — CBC
HCT: 21.9 % — ABNORMAL LOW (ref 39.0–52.0)
HCT: 21.3 % — ABNORMAL LOW (ref 39.0–52.0)
Hemoglobin: 7.2 g/dL — ABNORMAL LOW (ref 13.0–17.0)
Hemoglobin: 6.9 g/dL — CL (ref 13.0–17.0)
MCH: 29 pg (ref 26.0–34.0)
MCH: 28.4 pg (ref 26.0–34.0)
MCHC: 32.9 g/dL (ref 30.0–36.0)
MCHC: 32.4 g/dL (ref 30.0–36.0)
MCV: 88.3 fL (ref 78.0–100.0)
MCV: 87.7 fL (ref 78.0–100.0)
Platelets: 172 10*3/uL (ref 150–400)
Platelets: 193 10*3/uL (ref 150–400)
RBC: 2.48 MIL/uL — ABNORMAL LOW (ref 4.22–5.81)
RBC: 2.43 MIL/uL — ABNORMAL LOW (ref 4.22–5.81)
RDW: 15.9 % — ABNORMAL HIGH (ref 11.5–15.5)
RDW: 15.8 % — ABNORMAL HIGH (ref 11.5–15.5)
WBC: 6.4 10*3/uL (ref 4.0–10.5)
WBC: 5.9 10*3/uL (ref 4.0–10.5)

## 2016-01-10 LAB — HEMOGLOBIN AND HEMATOCRIT, BLOOD
HCT: 22.9 % — ABNORMAL LOW (ref 39.0–52.0)
Hemoglobin: 7.7 g/dL — ABNORMAL LOW (ref 13.0–17.0)

## 2016-01-10 LAB — GLUCOSE, CAPILLARY: Glucose-Capillary: 77 mg/dL (ref 65–99)

## 2016-01-10 LAB — APTT: aPTT: 37 seconds — ABNORMAL HIGH (ref 24–36)

## 2016-01-10 LAB — PREPARE RBC (CROSSMATCH)

## 2016-01-10 LAB — OCCULT BLOOD X 1 CARD TO LAB, STOOL: Fecal Occult Bld: POSITIVE — AB

## 2016-01-10 LAB — PROTIME-INR
INR: 0.98
Prothrombin Time: 13 seconds (ref 11.4–15.2)

## 2016-01-10 MED ORDER — SODIUM CHLORIDE 0.9 % IV SOLN
Freq: Once | INTRAVENOUS | Status: AC
  Administered 2016-01-10: 250 mL via INTRAVENOUS

## 2016-01-10 MED ORDER — CALCITRIOL 0.25 MCG PO CAPS
0.7500 ug | ORAL_CAPSULE | ORAL | Status: DC
  Administered 2016-01-11: 0.75 ug via ORAL
  Filled 2016-01-10: qty 3

## 2016-01-10 MED ORDER — DARBEPOETIN ALFA 200 MCG/0.4ML IJ SOSY
200.0000 ug | PREFILLED_SYRINGE | INTRAMUSCULAR | Status: DC
  Administered 2016-01-11: 200 ug via INTRAVENOUS
  Filled 2016-01-10: qty 0.4

## 2016-01-10 MED ORDER — HYDRALAZINE HCL 25 MG PO TABS
25.0000 mg | ORAL_TABLET | Freq: Three times a day (TID) | ORAL | Status: DC
  Administered 2016-01-10: 25 mg via ORAL
  Filled 2016-01-10: qty 1

## 2016-01-10 MED ORDER — AZITHROMYCIN 600 MG PO TABS
1200.0000 mg | ORAL_TABLET | ORAL | Status: DC
  Administered 2016-01-10: 1200 mg via ORAL
  Filled 2016-01-10: qty 2

## 2016-01-10 NOTE — Progress Notes (Signed)
Subjective: Jim Wyatt is feeling okay this morning. Reports he had trouble sleeping in the uncomfortably hospital bed overnight. His abdominal pain in the RLQ is about the same as previous and he denies further hematochezia overnight or this morning. His Hb at 2am was 7.2 (up from 5.9 on admission s/p 2 units pRBC), recheck later this morning (10 AM) revealed Hb decreasing again, now 6.9. He has had some hypotension this morning, 110/60 from 166/99 overnight. Asymptomatic but will hold some of his antihypertensives. Will transfuse another unit and curbside/consult GI to get their assessment.  Objective: Vital signs in last 24 hours: Vitals:   01/09/16 2116 01/10/16 0447 01/10/16 1018 01/10/16 1020  BP: (!) 166/99 (!) 103/46 110/60 110/60  Pulse: (!) 122 93 92 92  Resp: 18 18    Temp: 99 F (37.2 C) 98.2 F (36.8 C)    TempSrc: Oral Oral    SpO2: 98% 97%  96%  Weight:      Height:        Intake/Output Summary (Last 24 hours) at 01/10/16 1228 Last data filed at 01/09/16 2115  Gross per 24 hour  Intake              636 ml  Output                0 ml  Net              636 ml    Physical Exam General appearance: Tired-appearing gentleman resting comfortably in bed, in no distress HENT: Normocephalic, atraumatic, moist mucous membranes, no visible lesions in oropharynx Eyes: PERRL, EOM inact, non-icteric Cardiovascular: Regular rate and rhythm, no murmurs, rubs, gallops Respiratory: Clear to auscultation bilaterally, normal work of breathing Abdomen: BS+, soft, mild generalized tenderness to palpation, worst in RLQ , non-distended, midline bandaged incision clean/dry/intact Extremities: Normal bulk and range of motion, no edema, bounding peripheral pulses, palpable thrill of left radial artery Skin: Warm, dry, intact, scattered acne but no visible skin lesions Psych: Appropriate affect, clear speech, thoughts linear and goal-directed  Labs / Imaging / Procedures: CBC Latest Ref  Rng & Units 01/10/2016 01/10/2016 01/09/2016  WBC 4.0 - 10.5 K/uL 5.9 6.4 4.6  Hemoglobin 13.0 - 17.0 g/dL 6.9(LL) 7.2(L) 5.9(LL)  Hematocrit 39.0 - 52.0 % 21.3(L) 21.9(L) 18.3(L)  Platelets 150 - 400 K/uL 193 172 178   BMP Latest Ref Rng & Units 01/10/2016 01/09/2016 01/04/2016  Glucose 65 - 99 mg/dL 76 137(H) 80  BUN 6 - 20 mg/dL 45(H) 36(H) 67(H)  Creatinine 0.61 - 1.24 mg/dL 10.33(H) 8.83(H) 14.20(H)  Sodium 135 - 145 mmol/L 138 137 135  Potassium 3.5 - 5.1 mmol/L 4.4 3.8 4.3  Chloride 101 - 111 mmol/L 99(L) 97(L) 96(L)  CO2 22 - 32 mmol/L 28 30 24   Calcium 8.9 - 10.3 mg/dL 8.2(L) 8.7(L) 8.9   No results found.  Assessment/Plan: Jim Wyatt is a 35 yo gentleman with AIDS/HIV, ESRD on HD, HTN, recent ileocecectomy for intussusception presenting with hematochezia and blood-loss anemia.   Hematochezia, with 4x bloody BMs early on 11/12, Hb was 5.9 from 8.5 at discharge 5 days prior, Hb recovered to 7.2 but down to 6.9 already 8 hours later, possibly due to lab variation or dilution with increased fluid intake. The source of his hematochezia is likely his anastomosis, other considerations being IRIS-related (patient with AIDS and CD4 count of 10 who began ART 1.5 weeks ago), mucosal kaposi sarcoma less likely given negative skin and oropharyngeal exam.  No hematochezia since presentation, bleeding may have stopped at this point.  - Appreciate surgery and GI recs - s/p 2 units pRBCs overnight, placed for 1 additional unit pRBCs today following Hb 6.9 - Follow post-transfusion and daily CBCs - [facility]/Dr. Buccini on board, curbside discussion, recommend continued obs, can consider tagged RBC scan, but amenable to full consult if situation changes  ESRD on HD, TTS dialysis, will require as inpatient - renal consulted to plan for likely HD tomorrow  AIDS/HIV, last CD4 count of 10 on 01/03/2016, viral load pending - Continue home ART - Dapsone PCP ppx - Azithro weekly ppx HTN - continue  home medications Seizures - hold keppra, only received on dialysis days Depression - continue home Amitriptyline 25mg  QHS, monitor for QT changes on telemetry as interaction with Azithromycin may occur  FEN/GI: Renal diet, replete electrolytes as needed  DVT ppx: Lovenox  Dispo: Anticipated discharge in approximately 2-3 day(s).   LOS: 1 day   Asencion Partridge, MD 01/10/2016, 12:27 PM Pager: [phone]

## 2016-01-10 NOTE — Progress Notes (Signed)
Subjective:  Recently DC 10/31- 93/79/02  Ileocolic Intussuception/Infarcted Appendix >Ileocrctomy, now admitted with Hematochezia Hb 5.9 from 8.5 at discharge . He attdemonstrated ischemic changes in the intussusception but resection margins were viable.  No tumor seenended HD last on Saturday on schedule with out problems .This am Dr. Elder Negus eval.  And noted he reviewed his Pathology form recent surgery  And it ""demonstrated ischemic changes in the intussusception but resection margins were viable.  No tumor seen.""  He had a nonbloody stool this am . About to eat a solid Lunch     Objective Vital signs in last 24 hours: Vitals:   01/10/16 0447 01/10/16 1018 01/10/16 1020 01/10/16 1237  BP: (!) 103/46 110/60 110/60 (!) 110/55  Pulse: 93 92 92 92  Resp: 18   16  Temp: 98.2 F (36.8 C)   98.4 F (36.9 C)  TempSrc: Oral   Oral  SpO2: 97%  96% 96%  Weight:      Height:       Weight change:   Physical Exam: General: Alert AAM NAD OX4  Heart: RRR ,no rub or mur  Lungs: CTA  Abdomen:  BS +, soft ,minimal tenderness with midline bandage  Not removed  Extremities: no pedal edema Dialysis Access: Pos bruit LUA AVF   Problem/Plan: 1.  Hematochezia-  Per admit team ,CCS 2. ESRD - HD on schedule TTS  3. Anemia -  Of ESRD and Recent Surgery - sp transfusion 1 unit prbcs  hgb 5.9> transfusion >7.2>6.9/ ESA on hd weekly tues next due 01/12/16  200 Aranesp  4. Secondary hyperparathyroidism -vit d on hd and renvela binder 5. HTN/volume -bp lowish /3 bp meds  May need to hold /taper down//   below edw with recent OP  HD/ on bp meds  HD in AM ,no excess vol on exam ,keep even have dc'd losartan and dec'd hydralazine, cont coreg for now 6. HIV/ AIDS - meds per admit team  7. Depression -meds   Ernest Haber, PA-C [facility] Beeper [phone] 01/10/2016,1:20 PM  LOS: 1 day   Pt seen, examined, agree w assess/plan as above with additions as indicated. ESRD pt readmitted  after recent abd surgery now with BRBPR.  Per admit team.  BP's soft, we are reducing BP meds for now.  Plan HD tomorrow.  Kelly Splinter MD [facility] pager [phone]    cell [phone] 01/10/2016, 3:45 PM     Labs: Basic Metabolic Panel:  Recent Labs Lab 01/04/16 0601 01/09/16 1238 01/10/16 0257  NA 135 137 138  K 4.3 3.8 4.4  CL 96* 97* 99*  CO2 24 30 28   GLUCOSE 80 137* 76  BUN 67* 36* 45*  CREATININE 14.20* 8.83* 10.33*  CALCIUM 8.9 8.7* 8.2*   Liver Function Tests:  Recent Labs Lab 01/09/16 1238 01/10/16 0257  AST 55* 45*  ALT 8* 6*  ALKPHOS 30* 35*  BILITOT 0.5 0.5  PROT 6.2* 5.8*  ALBUMIN 2.0* 1.9*   No results for input(s): LIPASE, AMYLASE in the last 168 hours. No results for input(s): AMMONIA in the last 168 hours. CBC:  Recent Labs Lab 01/04/16 0601 01/09/16 1238 01/10/16 0257 01/10/16 0925  WBC 5.3 4.6 6.4 5.9  HGB 8.5* 5.9* 7.2* 6.9*  HCT 25.8* 18.3* 21.9* 21.3*  MCV 89.0 89.7 88.3 87.7  PLT 118* 178 172 193   Cardiac Enzymes: No results for input(s): CKTOTAL, CKMB, CKMBINDEX, TROPONINI in the last 168 hours. CBG:  Recent Labs Lab  01/10/16 0826  GLUCAP 77    Studies/Results: No results found. Medications:  . sodium chloride   Intravenous Once  . abacavir  600 mg Oral Q supper  . amitriptyline  25 mg Oral QHS  . azithromycin  1,200 mg Oral Weekly  . carvedilol  25 mg Oral BID  . dapsone  100 mg Oral Daily  . darunavir-cobicistat  1 tablet Oral QPM  . diltiazem  180 mg Oral Daily  . dolutegravir  50 mg Oral QPM  . hydrALAZINE  100 mg Oral TID  . levETIRAcetam  500 mg Oral Once per day on Sun Mon Wed Fri  . losartan  100 mg Oral QHS  . sodium chloride flush  3 mL Intravenous Q12H

## 2016-01-10 NOTE — Progress Notes (Signed)
Patient ID: Jim Wyatt, male   DOB: 02/07/1981, 35 y.o.   MRN: [id number]  [facility] Surgery Progress Note     Subjective: Abdomen still sore this morning. He did have a BM earlier today and it was not bloody.  Objective: Vital signs in last 24 hours: Temp:  [98.2 F (36.8 C)-99.4 F (37.4 C)] 98.2 F (36.8 C) (11/13 0447) Pulse Rate:  [92-125] 92 (11/13 1018) Resp:  [9-22] 18 (11/13 0447) BP: (103-166)/(46-107) 110/60 (11/13 1018) SpO2:  [97 %-100 %] 97 % (11/13 0447) Weight:  [143 lb 1.6 oz (64.9 kg)] 143 lb 1.6 oz (64.9 kg) (11/12 1210) Last BM Date: 01/09/16  Intake/Output from previous day: 11/12 0701 - 11/13 0700 In: 636 [Blood:636] Out: -  Intake/Output this shift: No intake/output data recorded.  PE: Gen:  Alert, NAD, pleasant Card:  Regular rhythm, tachy Pulm:  CTAB Abd: Soft, ND, tender right side of abdomen, +BS, midline incision healing well and nearly closed with no concern for infection  Lab Results:   Recent Labs  01/10/16 0257 01/10/16 0925  WBC 6.4 5.9  HGB 7.2* 6.9*  HCT 21.9* 21.3*  PLT 172 193   BMET  Recent Labs  01/09/16 1238 01/10/16 0257  NA 137 138  K 3.8 4.4  CL 97* 99*  CO2 30 28  GLUCOSE 137* 76  BUN 36* 45*  CREATININE 8.83* 10.33*  CALCIUM 8.7* 8.2*   PT/INR No results for input(s): LABPROT, INR in the last 72 hours. CMP     Component Value Date/Time   NA 138 01/10/2016 0257   K 4.4 01/10/2016 0257   CL 99 (L) 01/10/2016 0257   CO2 28 01/10/2016 0257   GLUCOSE 76 01/10/2016 0257   BUN 45 (H) 01/10/2016 0257   CREATININE 10.33 (H) 01/10/2016 0257   CREATININE 10.77 (H) 01/11/2015 1651   CALCIUM 8.2 (L) 01/10/2016 0257   PROT 5.8 (L) 01/10/2016 0257   ALBUMIN 1.9 (L) 01/10/2016 0257   AST 45 (H) 01/10/2016 0257   ALT 6 (L) 01/10/2016 0257   ALKPHOS 35 (L) 01/10/2016 0257   BILITOT 0.5 01/10/2016 0257   GFRNONAA 6 (L) 01/10/2016 0257   GFRNONAA 6 (L) 01/11/2015 1651   GFRAA 7 (L) 01/10/2016 0257    GFRAA 7 (L) 01/11/2015 1651   Lipase     Component Value Date/Time   LIPASE 43 12/28/2015 1011       Studies/Results: No results found.  Anti-infectives: Anti-infectives    Start     Dose/Rate Route Frequency Ordered Stop   01/09/16 2000  abacavir (ZIAGEN) tablet 600 mg     600 mg Oral Daily with supper 01/09/16 1719     01/09/16 2000  darunavir-cobicistat (PREZCOBIX) 800-150 MG per tablet 1 tablet     1 tablet Oral Every evening 01/09/16 1719     01/09/16 2000  dolutegravir (TIVICAY) tablet 50 mg     50 mg Oral Every evening 01/09/16 1719     01/09/16 2000  dapsone tablet 100 mg     100 mg Oral Daily 01/09/16 1719         Assessment/Plan S/p open ileocecectomy for intussusception 10/31 Dr. Kae Heller - Surgical path: BENIGN SMALL BOWEL AND COLONIC TYPE MUCOSA WITH EDEMATOUS SUBMUCOSA, ISCHEMIC TYPE CHANGES, SEROSITIS - readmitted yesterday for hematochezia - suspect that the anastomosis has sloughed given timeframe after surgery and hopefully will self-resolve - May require endoscopic intervention if bleeding continues - Hg slightly down today, 6.9 from 7.2 (5.9  on admission)  ESRD on HD AIDS/HIV HTN Seizures  VTE - SCDs FEN - renal diet  Plan - Hg 5.9 on admission, 7.2 after 2 uPRBC, now down to 6.9 again; about to receive 2 more units. continue to monitor with serial exams/labs. Had BM this morning that was non-bloody.   LOS: 1 day    Jerrye Beavers , [facility] Surgery 01/10/2016, 10:30 AM Pager: [phone] Consults: [phone] Mon-Fri 7:00 am-4:30 pm Sat-Sun 7:00 am-11:30 am

## 2016-01-10 NOTE — Consult Note (Signed)
WOC consulted for BID wound care.  Met with bedside nurse. Patient followed by [facility] for midline surgical wound.  Normal saline moist gauze BID to the midline wound, change BID.  Orders updated. [facility]CS has evaluated this patient this am.  Discussed POC with bedside nurse.  Re consult if needed, will not follow at this time. Thanks  Yan Pankratz [employer], RN,CWOCN, CNS [phone])

## 2016-01-11 ENCOUNTER — Telehealth: Payer: Self-pay | Admitting: Internal Medicine

## 2016-01-11 LAB — BASIC METABOLIC PANEL
Anion gap: 12 (ref 5–15)
BUN: 60 mg/dL — ABNORMAL HIGH (ref 6–20)
CO2: 27 mmol/L (ref 22–32)
Calcium: 8.3 mg/dL — ABNORMAL LOW (ref 8.9–10.3)
Chloride: 98 mmol/L — ABNORMAL LOW (ref 101–111)
Creatinine, Ser: 12.48 mg/dL — ABNORMAL HIGH (ref 0.61–1.24)
GFR calc Af Amer: 5 mL/min — ABNORMAL LOW (ref >60)
GFR calc non Af Amer: 5 mL/min — ABNORMAL LOW (ref >60)
Glucose, Bld: 89 mg/dL (ref 65–99)
Potassium: 5.1 mmol/L (ref 3.5–5.1)
Sodium: 137 mmol/L (ref 135–145)

## 2016-01-11 LAB — CBC
HCT: 24.6 % — ABNORMAL LOW (ref 39.0–52.0)
Hemoglobin: 8.1 g/dL — ABNORMAL LOW (ref 13.0–17.0)
MCH: 28.8 pg (ref 26.0–34.0)
MCHC: 32.9 g/dL (ref 30.0–36.0)
MCV: 87.5 fL (ref 78.0–100.0)
Platelets: 200 10*3/uL (ref 150–400)
RBC: 2.81 MIL/uL — ABNORMAL LOW (ref 4.22–5.81)
RDW: 15.6 % — ABNORMAL HIGH (ref 11.5–15.5)
WBC: 6.6 10*3/uL (ref 4.0–10.5)

## 2016-01-11 MED ORDER — CALCITRIOL 0.25 MCG PO CAPS
ORAL_CAPSULE | ORAL | Status: AC
  Filled 2016-01-11: qty 1

## 2016-01-11 MED ORDER — HYDRALAZINE HCL 25 MG PO TABS
25.0000 mg | ORAL_TABLET | Freq: Three times a day (TID) | ORAL | Status: DC
  Filled 2016-01-11: qty 1

## 2016-01-11 MED ORDER — DARBEPOETIN ALFA 200 MCG/0.4ML IJ SOSY
PREFILLED_SYRINGE | INTRAMUSCULAR | Status: AC
  Filled 2016-01-11: qty 0.4

## 2016-01-11 MED ORDER — CALCITRIOL 0.5 MCG PO CAPS
ORAL_CAPSULE | ORAL | Status: AC
  Filled 2016-01-11: qty 3

## 2016-01-11 MED ORDER — AZITHROMYCIN 600 MG PO TABS: 1200.0000 mg | ORAL_TABLET | ORAL | 0 refills | Status: DC

## 2016-01-11 MED ORDER — HYDRALAZINE HCL 100 MG PO TABS: 50.0000 mg | ORAL_TABLET | Freq: Three times a day (TID) | ORAL | Status: DC

## 2016-01-11 MED ORDER — HYDRALAZINE HCL 100 MG PO TABS: 50.0000 mg | ORAL_TABLET | Freq: Two times a day (BID) | ORAL | 1 refills | Status: DC

## 2016-01-11 NOTE — Telephone Encounter (Signed)
Left pt VM to return my call, does not appear that patient has any insurance coverage. If patient does have coverage other than what he had in the system we need an updated card to file the last three visits. If patient is uninsured he may see the financial counselor to see if he qualifies for assistance. Patient is currently set up as self pay.

## 2016-01-11 NOTE — Progress Notes (Signed)
Nsg Discharge Note  Admit Date:  01/09/2016 Discharge date: 01/11/2016   Jim Wyatt to be D/C'd home per MD order.  AVS completed.  Copy for chart, and copy for patient signed, and dated. Patient/caregiver able to verbalize understanding.  Discharge Medication:   Medication List    TAKE these medications   abacavir 300 MG tablet Commonly known as:  ZIAGEN Take 2 tablets (600 mg total) by mouth daily with supper.   acetaminophen 500 MG tablet Commonly known as:  TYLENOL Take 1,000 mg by mouth every 6 (six) hours as needed (pain).   albuterol 108 (90 Base) MCG/ACT inhaler Commonly known as:  PROVENTIL HFA;VENTOLIN HFA Inhale 2 puffs into the lungs every 6 (six) hours as needed for wheezing or shortness of breath.   amitriptyline 25 MG tablet Commonly known as:  ELAVIL Take 1 tablet (25 mg total) by mouth at bedtime.   azithromycin 600 MG tablet Commonly known as:  ZITHROMAX Take 2 tablets (1,200 mg total) by mouth once a week. On Mondays What changed:  additional instructions   CARTIA XT 180 MG 24 hr capsule Generic drug:  diltiazem Take 180 mg by mouth daily.   carvedilol 25 MG tablet Commonly known as:  COREG Take 25 mg by mouth 2 (two) times daily.   Cyanocobalamin 2500 MCG Chew Chew 5,000 mcg by mouth daily.   dapsone 100 MG tablet Take 1 tablet (100 mg total) by mouth daily.   darunavir-cobicistat 800-150 MG tablet Commonly known as:  PREZCOBIX Take 1 tablet by mouth every evening. Swallow whole. Do NOT crush, break or chew tablets. Take with food.   dolutegravir 50 MG tablet Commonly known as:  TIVICAY Take 1 tablet (50 mg total) by mouth every evening.   hydrALAZINE 100 MG tablet Commonly known as:  APRESOLINE Take 0.5 tablets (50 mg total) by mouth 2 (two) times daily. 6:30am, 2:30pm, 11pm - for high blood pressure What changed:  how much to take  when to take this   HYDROcodone-acetaminophen 5-325 MG tablet Commonly known as:   NORCO/VICODIN Take 1-2 tablets by mouth every 6 (six) hours as needed for moderate pain or severe pain.   levETIRAcetam 500 MG tablet Commonly known as:  KEPPRA Take 500 mg by mouth See admin instructions. Take 1 tablet (500 mg) by mouth Sunday, Monday, Wednesday, Friday at 7pm (non-dialysis days) What changed:  Another medication with the same name was changed. Make sure you understand how and when to take each.   levETIRAcetam 250 MG tablet Commonly known as:  KEPPRA Take 1 tablet (250 mg total) by mouth See admin instructions. Take 1 tablet after your dialysis treatments. What changed:  additional instructions   losartan 100 MG tablet Commonly known as:  COZAAR Take 100 mg by mouth at bedtime.   ondansetron 8 MG disintegrating tablet Commonly known as:  ZOFRAN ODT Take 1 tablet (8 mg total) by mouth every 8 (eight) hours as needed for nausea or vomiting.   VELPHORO 500 MG chewable tablet Generic drug:  sucroferric oxyhydroxide Chew 500 mg by mouth See admin instructions. Chew 1 tablet (500 mg) by mouth after every meal (2-3 times daily)   Vitamin C 250 MG Chew Chew 250 mg by mouth daily.       Discharge Assessment: Vitals:   01/11/16 1214 01/11/16 1503  BP: (!) 143/81 113/62  Pulse: (!) 106 (!) 104  Resp: 18   Temp: 98.6 F (37 C)    Skin clean, dry and intact  without evidence of skin break down, no evidence of skin tears noted. IV catheter discontinued intact. Site without signs and symptoms of complications - no redness or edema noted at insertion site, patient denies c/o pain - only slight tenderness at site.  Dressing with slight pressure applied.  D/c Instructions-Education: Discharge instructions given to patient/family with verbalized understanding. D/c education completed with patient/family including follow up instructions, medication list, d/c activities limitations if indicated, with other d/c instructions as indicated by MD - patient able to verbalize  understanding, all questions fully answered. Patient instructed to return to ED, call 911, or call MD for any changes in condition.  Patient escorted via [place], and D/C home via private auto with mother.  Wyonia Hough, RN 01/11/2016 5:31 PM

## 2016-01-11 NOTE — Progress Notes (Signed)
Subjective: No new hematochezia. Hb 8.1 from 6.9 after 1 unit pRBCs yesterday. Scheduled for HD today, minimize heparin due to recent bleeding events. Tolerated HD well. Poor appetite but tolerating solid food well.  Hemoglobin stabilized after transfusion, no new bleeding events. Clear for discharge later today from our standpoint.  Discussed with surgery/Dr. Dalbert Batman at 2:40PM, agreed that he is stable for discharge. Had follow up with Dr. Kae Heller on 11/17 and with our clinic on 11/21  Objective: Vital signs in last 24 hours: Vitals:   01/10/16 1759 01/10/16 1824 01/10/16 2102 01/11/16 0455  BP: 112/63 108/61 117/72 132/82  Pulse: 87 88 94 99  Resp: 16 18 18 18   Temp: 98.7 F (37.1 C) 98.4 F (36.9 C) 98.3 F (36.8 C) 98.3 F (36.8 C)  TempSrc: Oral Oral Oral Oral  SpO2: 95% 96% 100% 94%  Weight:    67.1 kg (147 lb 14.4 oz)  Height:        Intake/Output Summary (Last 24 hours) at 01/11/16 0657 Last data filed at 01/11/16 0458  Gross per 24 hour  Intake              616 ml  Output                0 ml  Net              616 ml    Physical Exam General appearance: Tired-appearing gentleman resting comfortably in bed, in no distress HENT: Normocephalic, atraumatic, moist mucous membranes, no visible lesions in oropharynx Eyes: PERRL, EOM inact, non-icteric Cardiovascular: Regular rate and rhythm, no murmurs, rubs, gallops Respiratory: Clear to auscultation bilaterally, normal work of breathing Abdomen: BS+, soft, mild generalized tenderness to palpation, worst in RLQ , non-distended, midline bandaged incision clean/dry/intact Extremities: Normal bulk and range of motion, no edema, bounding peripheral pulses, palpable thrill of left radial artery Skin: Warm, dry, intact, scattered acne but no visible skin lesions Psych: Appropriate affect, clear speech, thoughts linear and goal-directed  Labs / Imaging / Procedures: CBC Latest Ref Rng & Units 01/11/2016 01/10/2016 01/10/2016   WBC 4.0 - 10.5 K/uL 6.6 - 5.9  Hemoglobin 13.0 - 17.0 g/dL 8.1(L) 7.7(L) 6.9(LL)  Hematocrit 39.0 - 52.0 % 24.6(L) 22.9(L) 21.3(L)  Platelets 150 - 400 K/uL 200 - 193   BMP Latest Ref Rng & Units 01/11/2016 01/10/2016 01/09/2016  Glucose 65 - 99 mg/dL 89 76 137(H)  BUN 6 - 20 mg/dL 60(H) 45(H) 36(H)  Creatinine 0.61 - 1.24 mg/dL 12.48(H) 10.33(H) 8.83(H)  Sodium 135 - 145 mmol/L 137 138 137  Potassium 3.5 - 5.1 mmol/L 5.1 4.4 3.8  Chloride 101 - 111 mmol/L 98(L) 99(L) 97(L)  CO2 22 - 32 mmol/L 27 28 30   Calcium 8.9 - 10.3 mg/dL 8.3(L) 8.2(L) 8.7(L)   No results found.  Assessment/Plan: Jim Wyatt is a 35 yo gentleman with AIDS/HIV, ESRD on HD, HTN, recent ileocecectomy for intussusception presenting with hematochezia and blood-loss anemia.   Hematochezia, with 4x bloody BMs early on 11/12, Hb was 5.9 from 8.5 at discharge 5 days prior, Hb recovered to 7.2 but down to 6.9 already 8 hours later, possibly due to lab variation or dilution with increased fluid intake. The source of his hematochezia is likely his anastomosis, other considerations being IRIS-related (patient with AIDS and CD4 count of 10 who began ART 1.5 weeks ago), mucosal kaposi sarcoma less likely given negative skin and oropharyngeal exam. No hematochezia since presentation, bleeding may have stopped at this  point.  - Appreciate surgery and GI recs - s/p 2 units pRBCs overnight Hb increased to 7.1. Minimal drop to 6.9 likely dilutional vs lab variation. Received additional 1 unit yesterday and now stable at Hb 8.1 without new bleeding events. [facility]/Dr. Buccini on board, curbside discussion, recommend continued obs, can consider tagged RBC scan, but amenable to full consult if situation changes  ESRD on HD, TTS dialysis, will require as inpatient, tolerated HD well - continue HD on TTS  AIDS/HIV, last CD4 count of 10 on 01/03/2016, viral load pending - Continue home ART - Dapsone PCP ppx - Azithro weekly  ppx HTN - continue home medications, reduce hydralazine dose Seizures - hold keppra, only received on dialysis days Depression - continue home Amitriptyline 25mg  QHS, monitor for QT changes on telemetry as interaction with Azithromycin may occur  FEN/GI: Renal diet, replete electrolytes as needed  DVT ppx: Lovenox  Dispo: Anticipated discharge in approximately 2-3 day(s).   LOS: 2 days   Asencion Partridge, MD 01/11/2016, 6:57 AM Pager: [phone]

## 2016-01-11 NOTE — Discharge Instructions (Addendum)
It was a pleasure taking care of you.  Please follow up with your PCP.  At the appointment, please repeat the blood work to check your hemoglobin and also ask to obtain a new EKG.  Please also note the decreased dose of the hydralazine.  Please talk with your PCP about changing you depression medicine (amitryptiline ) to something else, as it may interact with the azithromycin antibiotic and one of the HIV medicine   Please call your PCP or come to the ER, if you have persistent bowel movements that are bloody.   11/21 2:45 PM- come to Internal Medicine clinic if you do not hear back from [facility].

## 2016-01-11 NOTE — Discharge Summary (Signed)
Name: Jim Wyatt MRN: [id number] DOB: 1980/03/23 35 y.o. PCP: Jim Garter, MD  Date of Admission: 01/09/2016 12:10 PM Date of Discharge: 01/11/2016 Attending Physician: Jim Groves, DO  Discharge Diagnosis: 1. Hematochezia  Principal Problem:   Acute blood loss anemia Active Problems:   Anemia due to end stage renal disease (Jim Wyatt)   ESRD on dialysis (Jim Wyatt)   AIDS (acquired immunodeficiency syndrome), CD4 <=200 (Jim Wyatt)   Hypertension   Seizure disorder (Jim Wyatt)   Hematochezia   GI bleed   Discharge Medications:   Medication List    TAKE these medications   abacavir 300 MG tablet Commonly known as:  ZIAGEN Take 2 tablets (600 mg total) by mouth daily with supper.   acetaminophen 500 MG tablet Commonly known as:  TYLENOL Take 1,000 mg by mouth every 6 (six) hours as needed (pain).   albuterol 108 (90 Base) MCG/ACT inhaler Commonly known as:  PROVENTIL HFA;VENTOLIN HFA Inhale 2 puffs into the lungs every 6 (six) hours as needed for wheezing or shortness of breath.   amitriptyline 25 MG tablet Commonly known as:  ELAVIL Take 1 tablet (25 mg total) by mouth at bedtime.   azithromycin 600 MG tablet Commonly known as:  ZITHROMAX Take 2 tablets (1,200 mg total) by mouth once a week. On Mondays What changed:  additional instructions   CARTIA XT 180 MG 24 hr capsule Generic drug:  diltiazem Take 180 mg by mouth daily.   carvedilol 25 MG tablet Commonly known as:  COREG Take 25 mg by mouth 2 (two) times daily.   Cyanocobalamin 2500 MCG Chew Chew 5,000 mcg by mouth daily.   dapsone 100 MG tablet Take 1 tablet (100 mg total) by mouth daily.   darunavir-cobicistat 800-150 MG tablet Commonly known as:  PREZCOBIX Take 1 tablet by mouth every evening. Swallow whole. Do NOT crush, break or chew tablets. Take with food.   dolutegravir 50 MG tablet Commonly known as:  TIVICAY Take 1 tablet (50 mg total) by mouth every evening.   hydrALAZINE 100 MG  tablet Commonly known as:  APRESOLINE Take 0.5 tablets (50 mg total) by mouth 3 (three) times daily. 6:30am, 2:30pm, 11pm - for high blood pressure What changed:  how much to take   HYDROcodone-acetaminophen 5-325 MG tablet Commonly known as:  NORCO/VICODIN Take 1-2 tablets by mouth every 6 (six) hours as needed for moderate pain or severe pain.   levETIRAcetam 500 MG tablet Commonly known as:  KEPPRA Take 500 mg by mouth See admin instructions. Take 1 tablet (500 mg) by mouth Sunday, Monday, Wednesday, Friday at 7pm (non-dialysis days) What changed:  Another medication with the same name was changed. Make sure you understand how and when to take each.   levETIRAcetam 250 MG tablet Commonly known as:  KEPPRA Take 1 tablet (250 mg total) by mouth See admin instructions. Take 1 tablet after your dialysis treatments. What changed:  additional instructions   losartan 100 MG tablet Commonly known as:  COZAAR Take 100 mg by mouth at bedtime.   ondansetron 8 MG disintegrating tablet Commonly known as:  ZOFRAN ODT Take 1 tablet (8 mg total) by mouth every 8 (eight) hours as needed for nausea or vomiting.   VELPHORO 500 MG chewable tablet Generic drug:  sucroferric oxyhydroxide Chew 500 mg by mouth See admin instructions. Chew 1 tablet (500 mg) by mouth after every meal (2-3 times daily)   Vitamin C 250 MG Chew Chew 250 mg by mouth daily.  Disposition and follow-up:   [name] was discharged from Jim Wyatt in Stable condition.  At the hospital follow up visit please address:  1.  Hematochezia - felt to be secondary to post-surgical anastomosis sloughing after his recent bowel obstruction, assess for further episodes of bloody bowel movements, melena, or symptoms of anema  Intussusception s/p ileocecal resection, assess abdominal exam and incision for healing or lack thereof  ESRD on HD - ensure that patient is attending dialysis sessions  TTS  AIDS/HIV - last CD4 count of 10 on 01/03/16, patient intermittently refusing ART during hospital stay due to inadequate diet options - ensure that patient is taking his ART and PCP/MAC prophylaxis  Depression - assess control on Amitriptyline, consider checking EKG for QT prolongation (interaction with weekly Azithromycin) and potentially changing to another antidepressant  2.  Labs / imaging needed at time of follow-up: CBC, EKG  3.  Pending labs/ test needing follow-up: HIV-1 RNA ultraquant from 01/03/16  Follow-up Appointments: Follow-up Information    Jim Chessman, MD. Go on 01/18/2016.   Specialty:  Internal Medicine Why:  At 2:45 PM, please arrive 15 minutes early to check in. Contact information: [place] [address] [phone]        Jim Wyatt Follow up.   Why:  Resumption of home health services arranged (RN,PT). Contact information: [place] [address] [phone]        Jim Riley, MD Follow up on 01/14/2016.   Specialty:  General Surgery Why:  2pm with Dr. Kae Wyatt. Please arrive 30 minutes prior to your appointment to fill out necessary paperwork. Contact information: Jim Wyatt Hospital Course by problem list: Principal Problem:   Acute blood loss anemia Active Problems:   Anemia due to end stage renal disease (Jim Wyatt)   ESRD on dialysis (Jim Wyatt)   AIDS (acquired immunodeficiency syndrome), CD4 <=200 (Jim Wyatt)   Hypertension   Seizure disorder (Jim Wyatt)   Hematochezia   GI bleed   1. Hematochezia Jim Wyatt is a 35 year old gentleman with PMH ESRD on HD, HIVAN, AIDS, seizures, and recent admission for intussesception requiring ileocolic resection on 57/84 who presented on 11/12 following several grossly bloody bowel movements overnight and progressive fatigue. He was found to have Hgb 5.9 from a stable 8.5 on discharge from previous hospitalization 5 days prior. He was  transfused 2 units of packed RBCs. Surgery evaluated the patient and felt this likely due anastomosis sloughing given the timeframe surgery, which typically self-resolves. Post-transfusion Hgb recovered to 7.1 early on 11/13, apparent drop to 6.9 later that day seemed likely due to lab variation and/or dilution given increased fluid intake. He had no episodes of bloody BM during this hospital stay. Discussed with GI who advised ongoing observation, given the risk of endoscopic/colonoscopic intervention and the source likely being the post-op anastomosis. Transfused one additional unit of packed RBCs given Hgb 6.9 and he increased to 8.1 by 11/14 with no further observed bleeding. He continues to have fatigue but suspect to be multifactorial or psychological given no change in this symptom with correction of anemia during this hospitalization or previous. Jim Wyatt has had no further hematochezia in 48+ hours, Hgb appropriately responding to transfusion, and close follow up scheduled with general surgery and primary care clinic. Following his session of dialysis today he was felt to be stable for discharge.  2. ESRD on HD - received  inpatient dialysis on 16/10 without complication  Discharge Vitals:   BP 113/62 (BP Location: Right Arm)   Pulse (!) 104   Temp 98.6 F (37 C) (Oral)   Resp 18   Ht 5\' 7"  (1.702 m)   Wt 66 kg (145 lb 8.1 oz)   SpO2 92%   BMI 22.79 kg/m   Pertinent Labs, Studies, and Procedures:  CBC Latest Ref Rng & Units 01/11/2016 01/10/2016 01/10/2016  WBC 4.0 - 10.5 K/uL 6.6 - 5.9  Hemoglobin 13.0 - 17.0 g/dL 8.1(L) 7.7(L) 6.9(LL)  Hematocrit 39.0 - 52.0 % 24.6(L) 22.9(L) 21.3(L)  Platelets 150 - 400 K/uL 200 - 193   BMP Latest Ref Rng & Units 01/11/2016 01/10/2016 01/09/2016  Glucose 65 - 99 mg/dL 89 76 137(H)  BUN 6 - 20 mg/dL 60(H) 45(H) 36(H)  Creatinine 0.61 - 1.24 mg/dL 12.48(H) 10.33(H) 8.83(H)  Sodium 135 - 145 mmol/L 137 138 137  Potassium 3.5 - 5.1 mmol/L 5.1  4.4 3.8  Chloride 101 - 111 mmol/L 98(L) 99(L) 97(L)  CO2 22 - 32 mmol/L 27 28 30   Calcium 8.9 - 10.3 mg/dL 8.3(L) 8.2(L) 8.7(L)     Ref Range & Units 1d ago   Prothrombin Time 11.4 - 15.2 seconds 13.0    INR  0.98     Ref Range & Units 1d ago  Fecal Occult Bld NEGATIVE POSITIVE     Discharge Instructions: Discharge Instructions    Diet - low sodium heart healthy    Complete by:  As directed    Increase activity slowly    Complete by:  As directed       Signed: Asencion Partridge, MD 01/11/2016, 4:07 PM   Pager: [phone]

## 2016-01-11 NOTE — Progress Notes (Signed)
   [facility] Progress Note   Subjective: no further rectal bleeding  Vitals:   01/11/16 1030 01/11/16 1100 01/11/16 1108 01/11/16 1214  BP:  (!) 141/75 (!) 143/77 (!) 143/81  Pulse:  100 98 (!) 106  Resp: 16 14 15 18   Temp:   98.2 F (36.8 C) 98.6 F (37 C)  TempSrc:   Oral Oral  SpO2:   99% 92%  Weight:   66 kg (145 lb 8.1 oz)   Height:        Inpatient medications: . abacavir  600 mg Oral Q supper  . amitriptyline  25 mg Oral QHS  . azithromycin  1,200 mg Oral Weekly  . calcitRIOL      . calcitRIOL  0.75 mcg Oral Q T,Th,Sa-HD  . carvedilol  25 mg Oral BID  . dapsone  100 mg Oral Daily  . Darbepoetin Alfa      . darbepoetin (ARANESP) injection - DIALYSIS  200 mcg Intravenous Q Tue-HD  . darunavir-cobicistat  1 tablet Oral QPM  . diltiazem  180 mg Oral Daily  . dolutegravir  50 mg Oral QPM  . hydrALAZINE  25 mg Oral TID  . levETIRAcetam  500 mg Oral Once per day on Sun Mon Wed Fri  . sodium chloride flush  3 mL Intravenous Q12H    acetaminophen **OR** acetaminophen, albuterol, HYDROcodone-acetaminophen, ondansetron **OR** ondansetron (ZOFRAN) IV, senna-docusate  [facility]   3h 104min   66.5kg   2/2 bath  P4   LFA AVF    Hep 5000 mircera 100 q 2, last 10/24 Calcitriol 0.75 tiw, last pth 975, P 7.6    Exam: General: Alert AAM NAD OX4  Heart: RRR ,no rub or mur  Lungs: CTA  Abdomen:  BS +, soft ,minimal tenderness with midline bandage  Not removed  Extremities: no pedal edema Dialysis Access: Pos bruit LUA AVF    Problems: 1.  Hematochezia-  may have resolved.  Hb 8.1 this am 2. ESRD - HD on schedule TTS  3. Anemia of CKD/ ABL - better 4. Secondary hyperparathyroidism -vit d on hd and renvela binder 5. HTN - BP's up today, resume hydralazine at lower dose and cont other meds 6. HIV/ AIDS - meds per admit team  7. Depression -meds   Plan - as above   Kelly Splinter MD [facility] pager [phone]    01/11/2016, 12:29 PM    Recent Labs Lab 01/09/16 1238 01/10/16 0257 01/11/16 0520  NA 137 138 137  K 3.8 4.4 5.1  CL 97* 99* 98*  CO2 30 28 27   GLUCOSE 137* 76 89  BUN 36* 45* 60*  CREATININE 8.83* 10.33* 12.48*  CALCIUM 8.7* 8.2* 8.3*    Recent Labs Lab 01/09/16 1238 01/10/16 0257  AST 55* 45*  ALT 8* 6*  ALKPHOS 30* 35*  BILITOT 0.5 0.5  PROT 6.2* 5.8*  ALBUMIN 2.0* 1.9*    Recent Labs Lab 01/10/16 0257 01/10/16 0925 01/10/16 1956 01/11/16 0520  WBC 6.4 5.9  --  6.6  HGB 7.2* 6.9* 7.7* 8.1*  HCT 21.9* 21.3* 22.9* 24.6*  MCV 88.3 87.7  --  87.5  PLT 172 193  --  200   Iron/TIBC/Ferritin/ %Sat    Component Value Date/Time   IRON 59 01/12/2015 1617   TIBC 248 (L) 01/12/2015 1617   FERRITIN 484 (H) 01/12/2015 1617   IRONPCTSAT 24 01/12/2015 1617

## 2016-01-11 NOTE — Progress Notes (Signed)
Subjective: Alert.  No distress. Minimal right sided abdominal pain. Eating some but appetite not good No stools last 24 hours. Hemoglobin went from 6.9-8.1 after 1 unit transfusion yesterday  Objective: Vital signs in last 24 hours: Temp:  [97.5 F (36.4 C)-98.7 F (37.1 C)] 98.3 F (36.8 C) (11/14 0455) Pulse Rate:  [87-99] 99 (11/14 0455) Resp:  [16-18] 18 (11/14 0455) BP: (108-136)/(53-82) 132/82 (11/14 0455) SpO2:  [94 %-100 %] 94 % (11/14 0455) Weight:  [67.1 kg (147 lb 14.4 oz)] 67.1 kg (147 lb 14.4 oz) (11/14 0455) Last BM Date: 01/10/16  Intake/Output from previous day: 11/13 0701 - 11/14 0700 In: 616 [P.O.:300; Blood:316] Out: -  Intake/Output this shift: Total I/O In: 300 [P.O.:300] Out: -   General appearance: Alert.  Does not appear to be any distress.  Family in room.  Depressed affect. Resp: clear to auscultation bilaterally GI: Soft.  No significant tenderness.  Wounds clean.  Dressing in place  Lab Results:  Results for orders placed or performed during the hospital encounter of 01/09/16 (from the past 24 hour(s))  Glucose, capillary     Status: None   Collection Time: 01/10/16  8:26 AM  Result Value Ref Range   Glucose-Capillary 77 65 - 99 mg/dL  CBC     Status: Abnormal   Collection Time: 01/10/16  9:25 AM  Result Value Ref Range   WBC 5.9 4.0 - 10.5 K/uL   RBC 2.43 (L) 4.22 - 5.81 MIL/uL   Hemoglobin 6.9 (LL) 13.0 - 17.0 g/dL   HCT 21.3 (L) 39.0 - 52.0 %   MCV 87.7 78.0 - 100.0 fL   MCH 28.4 26.0 - 34.0 pg   MCHC 32.4 30.0 - 36.0 g/dL   RDW 15.8 (H) 11.5 - 15.5 %   Platelets 193 150 - 400 K/uL  Prepare RBC     Status: None   Collection Time: 01/10/16 11:04 AM  Result Value Ref Range   Order Confirmation      ORDER PROCESSED BY BLOOD BANK BLOOD ALREADY AVAILABLE  APTT     Status: Abnormal   Collection Time: 01/10/16  1:16 PM  Result Value Ref Range   aPTT 37 (H) 24 - 36 seconds  Protime-INR     Status: None   Collection Time:  01/10/16  1:16 PM  Result Value Ref Range   Prothrombin Time 13.0 11.4 - 15.2 seconds   INR 0.98   Hemoglobin and hematocrit, blood     Status: Abnormal   Collection Time: 01/10/16  7:56 PM  Result Value Ref Range   Hemoglobin 7.7 (L) 13.0 - 17.0 g/dL   HCT 22.9 (L) 39.0 - 45.8 %  Basic metabolic panel     Status: Abnormal   Collection Time: 01/11/16  5:20 AM  Result Value Ref Range   Sodium 137 135 - 145 mmol/L   Potassium 5.1 3.5 - 5.1 mmol/L   Chloride 98 (L) 101 - 111 mmol/L   CO2 27 22 - 32 mmol/L   Glucose, Bld 89 65 - 99 mg/dL   BUN 60 (H) 6 - 20 mg/dL   Creatinine, Ser 12.48 (H) 0.61 - 1.24 mg/dL   Calcium 8.3 (L) 8.9 - 10.3 mg/dL   GFR calc non Af Amer 5 (L) >60 mL/min   GFR calc Af Amer 5 (L) >60 mL/min   Anion gap 12 5 - 15  CBC     Status: Abnormal   Collection Time: 01/11/16  5:20 AM  Result Value  Ref Range   WBC 6.6 4.0 - 10.5 K/uL   RBC 2.81 (L) 4.22 - 5.81 MIL/uL   Hemoglobin 8.1 (L) 13.0 - 17.0 g/dL   HCT 24.6 (L) 39.0 - 52.0 %   MCV 87.5 78.0 - 100.0 fL   MCH 28.8 26.0 - 34.0 pg   MCHC 32.9 30.0 - 36.0 g/dL   RDW 15.6 (H) 11.5 - 15.5 %   Platelets 200 150 - 400 K/uL     Studies/Results: No results found.  [name] abacavir  600 mg Oral Q supper  . amitriptyline  25 mg Oral QHS  . azithromycin  1,200 mg Oral Weekly  . calcitRIOL  0.75 mcg Oral Q T,Th,Sa-HD  . carvedilol  25 mg Oral BID  . dapsone  100 mg Oral Daily  . darbepoetin (ARANESP) injection - DIALYSIS  200 mcg Intravenous Q Tue-HD  . darunavir-cobicistat  1 tablet Oral QPM  . diltiazem  180 mg Oral Daily  . dolutegravir  50 mg Oral QPM  . hydrALAZINE  25 mg Oral TID  . levETIRAcetam  500 mg Oral Once per day on Sun Mon Wed Fri  . sodium chloride flush  3 mL Intravenous Q12H     Assessment/Plan:   S/p open ileocecectomy for intussusception 10/31 Dr. Kae Heller - Surgical path: BENIGN SMALL BOWEL AND COLONIC TYPE MUCOSA WITH EDEMATOUS SUBMUCOSA, ISCHEMIC TYPE CHANGES, SEROSITIS - readmitted  03/11/15 for hematochezia - suspect that the anastomosis has sloughed given timeframe after surgery and hopefully will self-resolve - May require endoscopic intervention if bleeding continues Hemoglobin up to 8.1 today.  Appropriate rise. -I think that he has stopped bleeding. -Advance diet as tolerated. -Discharge tomorrow if remains stable-  ESRD on HD--- minimal heparin, please  AIDS/HIV HTN Seizures VTE - SCDs... No heparin or Lovenox due to bleeding event. FEN - renal diet  @PROBHOSP @  LOS: 2 days    Jim Wyatt M 01/11/2016  . .prob

## 2016-01-11 NOTE — Care Management Note (Addendum)
Case Management Note  Patient Details  Name: Jim Wyatt MRN: [id number] Date of Birth: 1980/09/30  Subjective/Objective:          Acute blood loss anemia 2/2 GI bleed in setting of recent ileocecetomy and chronic anemia 2/2 ESRD, hx of AIDS, ESRD on HD( TTHS). Resides with a roommate. Mother Jim Wyatt (mom) is durable POA. Prior to admit [facility] provided pt with home health services( RN,PT).   Jim Wyatt (Mother)     [phone]       Action/Plan: Return to home when medically stable. CM to f/u with disposition needs.  Expected Discharge Date:     01/11/2016             Expected Discharge Plan:  [place]  In-House Referral:     Discharge planning Services  CM Consult  Post Acute Care Choice:  Resumption of Svcs/PTA Provider Choice offered to:  Patient  DME Arranged:    DME Agency:     HH Arranged:  RN, PT [facility] Agency:  [place] @ [phone]  Status of Service:  COMPLETED  If discussed at Long Length of Stay Meetings, dates discussed:    Additional Comments:  Sharin Mons, RN 01/11/2016, 12:54 PM

## 2016-01-12 ENCOUNTER — Ambulatory Visit (INDEPENDENT_AMBULATORY_CARE_PROVIDER_SITE_OTHER): Payer: BLUE CROSS/BLUE SHIELD | Admitting: Neurology

## 2016-01-12 ENCOUNTER — Encounter: Payer: Self-pay | Admitting: Neurology

## 2016-01-12 DIAGNOSIS — R569 Unspecified convulsions: Secondary | ICD-10-CM

## 2016-01-12 NOTE — Patient Instructions (Addendum)
1. Schedule open MRI brain without contrast 2. Schedule 1-hour sleep-deprived EEG 3. Continue Keppra as instructed 4. Follow-up in 2 months, call for any changes  Seizure Precautions: 1. If medication has been prescribed for you to prevent seizures, take it exactly as directed.  Do not stop taking the medicine without talking to your doctor first, even if you have not had a seizure in a long time.   2. Avoid activities in which a seizure would cause danger to yourself or to others.  Don't operate dangerous machinery, swim alone, or climb in high or dangerous places, such as on ladders, roofs, or girders.  Do not drive unless your doctor says you may.  3. If you have any warning that you may have a seizure, lay down in a safe place where you can't hurt yourself.    4.  No driving for 6 months from last seizure, as per [facility].   Please refer to the following link on the [place] website for more information: http://www.epilepsyfoundation.org/answerplace/Social/driving/drivingu.cfm   5.  Maintain good sleep hygiene. Avoid alcohol.  93.  Contact your doctor if you have any problems that may be related to the medicine you are taking.  7.  Call 911 and bring the patient back to the ED if:        A.  The seizure lasts longer than 5 minutes.       B.  The patient doesn't awaken shortly after the seizure  C.  The patient has new problems such as difficulty seeing, speaking or moving  D.  The patient was injured during the seizure  E.  The patient has a temperature over 102 F (39C)  F.  The patient vomited and now is having trouble breathing
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
## 2016-01-13 ENCOUNTER — Telehealth: Payer: Self-pay | Admitting: *Deleted

## 2016-01-13 DIAGNOSIS — N2581 Secondary hyperparathyroidism of renal origin: Secondary | ICD-10-CM | POA: Diagnosis not present

## 2016-01-13 DIAGNOSIS — Z23 Encounter for immunization: Secondary | ICD-10-CM | POA: Diagnosis not present

## 2016-01-13 DIAGNOSIS — D509 Iron deficiency anemia, unspecified: Secondary | ICD-10-CM | POA: Diagnosis not present

## 2016-01-13 DIAGNOSIS — N186 End stage renal disease: Secondary | ICD-10-CM | POA: Diagnosis not present

## 2016-01-13 LAB — TYPE AND SCREEN
ABO/RH(D): O POS
Antibody Screen: NEGATIVE
Unit division: 0
Unit division: 0
Unit division: 0
Unit division: 0

## 2016-01-13 NOTE — Telephone Encounter (Signed)
MA called to inform patient of mothers FMLA paperwork being completed and faxed. Hard copy will be placed at the front desk for pick up under [employer]. MA provided call back number to [facility] for any questions or concerns.

## 2016-01-14 LAB — REFLEX TO GENOSURE(R) MG: HIV GenoSure(R) MG PDF: 0

## 2016-01-14 LAB — HIV-1 RNA ULTRAQUANT REFLEX TO GENTYP+
HIV-1 RNA BY PCR: 136000 copies/mL
HIV-1 RNA Quant, Log: 5.134 log10copy/mL

## 2016-01-15 DIAGNOSIS — N186 End stage renal disease: Secondary | ICD-10-CM | POA: Diagnosis not present

## 2016-01-15 DIAGNOSIS — D509 Iron deficiency anemia, unspecified: Secondary | ICD-10-CM | POA: Diagnosis not present

## 2016-01-15 DIAGNOSIS — Z23 Encounter for immunization: Secondary | ICD-10-CM | POA: Diagnosis not present

## 2016-01-15 DIAGNOSIS — N2581 Secondary hyperparathyroidism of renal origin: Secondary | ICD-10-CM | POA: Diagnosis not present

## 2016-01-17 ENCOUNTER — Ambulatory Visit
Admission: RE | Admit: 2016-01-17 | Discharge: 2016-01-17 | Disposition: A | Discharge status: Home / Self Care | Payer: BLUE CROSS/BLUE SHIELD | Source: Ambulatory Visit | Attending: Neurology | Admitting: Neurology

## 2016-01-17 ENCOUNTER — Telehealth: Payer: Self-pay | Admitting: Internal Medicine

## 2016-01-17 ENCOUNTER — Encounter: Payer: Self-pay | Admitting: Neurology

## 2016-01-17 DIAGNOSIS — R569 Unspecified convulsions: Secondary | ICD-10-CM

## 2016-01-17 DIAGNOSIS — B20 Human immunodeficiency virus [HIV] disease: Secondary | ICD-10-CM | POA: Diagnosis not present

## 2016-01-17 NOTE — Telephone Encounter (Signed)
APT. REMINDER CALL, LMTCB °

## 2016-01-18 ENCOUNTER — Ambulatory Visit (INDEPENDENT_AMBULATORY_CARE_PROVIDER_SITE_OTHER): Payer: BLUE CROSS/BLUE SHIELD | Admitting: Internal Medicine

## 2016-01-18 ENCOUNTER — Encounter: Payer: Self-pay | Admitting: Internal Medicine

## 2016-01-18 VITALS — BP 147/75 | HR 101 | Temp 97.9°F | Ht 67.0 in | Wt 154.2 lb

## 2016-01-18 DIAGNOSIS — D62 Acute posthemorrhagic anemia: Secondary | ICD-10-CM | POA: Diagnosis not present

## 2016-01-18 DIAGNOSIS — Z9049 Acquired absence of other specified parts of digestive tract: Secondary | ICD-10-CM | POA: Diagnosis not present

## 2016-01-18 DIAGNOSIS — Z8719 Personal history of other diseases of the digestive system: Secondary | ICD-10-CM

## 2016-01-18 DIAGNOSIS — R300 Dysuria: Secondary | ICD-10-CM

## 2016-01-18 NOTE — Patient Instructions (Signed)
STOP amitriptiline. Try to bring back a urine specimen when you get a chance tomorrow morning.  We will check your blood counts today and call you if they are off.

## 2016-01-18 NOTE — Progress Notes (Signed)
   CC: Dysuria  HPI:  [name] is a 35 y.o. male who presents today for dysuria. Please see assessment & plan for status of chronic medical problems.  Past Medical History:  Diagnosis Date  . Asthma   . CAP (community acquired pneumonia) 10/2013  . Cellulitis 11/2013   LLE  . DVT (deep venous thrombosis) ([place]) 10/2013   BLE  . 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 Went on HD Dec 2016.   [name] Fever   . Headache   . HIV infection ([place]) dx'd 10/2013  . Hypertension   . Leg pain   . Multiple environmental allergies    "trees, dogs, cats"  . Multiple food allergies   . Shingles < 2010  . 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   Physical Exam:  Vitals:   01/18/16 1506  BP: (!) 147/75  Pulse: (!) 101  Temp: 97.9 F (36.6 C)  TempSrc: Oral  SpO2: 93%  Weight: 154 lb 3.2 oz (69.9 kg)  Height: 5\' 7"  (1.702 m)   Physical Exam  Constitutional: He is oriented to person, place, and time. No distress.  HENT:  Head: Normocephalic and atraumatic.  Eyes: Conjunctivae are normal. No scleral icterus.  Pulmonary/Chest: Effort normal. No respiratory distress.  Abdominal: Bowel sounds are normal. He exhibits no distension. There is tenderness. There is no rebound and no guarding.  Bandage covering midline incision. Tenderness to palpation just below the umbilicus overlying the area of the bladder.  Neurological: He is alert and oriented to person, place, and time.  Skin: He is not diaphoretic.     Assessment & Plan:   See Encounters Tab for problem based charting.  Patient discussed with Dr. Daryll Drown

## 2016-01-19 ENCOUNTER — Other Ambulatory Visit (INDEPENDENT_AMBULATORY_CARE_PROVIDER_SITE_OTHER): Payer: BLUE CROSS/BLUE SHIELD

## 2016-01-19 DIAGNOSIS — R300 Dysuria: Secondary | ICD-10-CM | POA: Diagnosis not present

## 2016-01-19 DIAGNOSIS — Z23 Encounter for immunization: Secondary | ICD-10-CM | POA: Diagnosis not present

## 2016-01-19 DIAGNOSIS — D509 Iron deficiency anemia, unspecified: Secondary | ICD-10-CM | POA: Diagnosis not present

## 2016-01-19 DIAGNOSIS — N2581 Secondary hyperparathyroidism of renal origin: Secondary | ICD-10-CM | POA: Diagnosis not present

## 2016-01-19 DIAGNOSIS — N186 End stage renal disease: Secondary | ICD-10-CM | POA: Diagnosis not present

## 2016-01-19 LAB — POCT URINALYSIS DIPSTICK
Bilirubin, UA: NEGATIVE
Glucose, UA: 100
Ketones, UA: NEGATIVE
Leukocytes, UA: NEGATIVE
Nitrite, UA: NEGATIVE
Protein, UA: >=300
Spec Grav, UA: 1.02
Urobilinogen, UA: 0.2
pH, UA: 8.5

## 2016-01-19 LAB — CBC
Hematocrit: 22.8 % — ABNORMAL LOW (ref 37.5–51.0)
Hemoglobin: 7.5 g/dL — ABNORMAL LOW (ref 12.6–17.7)
MCH: 28.7 pg (ref 26.6–33.0)
MCHC: 32.9 g/dL (ref 31.5–35.7)
MCV: 87 fL (ref 79–97)
Platelets: 254 10*3/uL (ref 150–379)
RBC: 2.61 x10E6/uL — CL (ref 4.14–5.80)
RDW: 15 % (ref 12.3–15.4)
WBC: 8.5 10*3/uL (ref 3.4–10.8)

## 2016-01-19 NOTE — Progress Notes (Signed)
His urine is without findings of infection given negative nitrites and leukocytes. He does have moderate RBCs which appears to be chronic when I review prior urinalyses dating back as far as one year ago.

## 2016-01-19 NOTE — Assessment & Plan Note (Addendum)
Assessment Over the last 4-5 days, he's developed dysuria. It is difficult to ascertain if he is producing less urine as he has been having diminishing urine output since initiating dialysis. He has not been having regular bowel movements, and his last one was yesterday in 2-3 days prior before yesterday. He does not report any fevers, back pain, chills.  He is normally on a Tuesday/Thursday/Saturday dialysis schedule though it was adjusted for this week given the upcoming holiday, and he missed his dialysis session on Monday out of not knowing this change.  With his mother other room, I asked about sexual history, and he denied any recent encounters, new rashes, discharge.  Amitriptyline was started at his last hospitalization, and I'm wondering if he is having urinary retention as a side effect. The absence of CVA tenderness is reassuring for no acute pyelonephritis. Certainly a urinary tract infection is also possible.  Plan -Recommended to his mother to use bulking agents, like Metamucil, to help with bowel movements as motility agents, like MiraLAX or senna, and have higher risk of anastomotic bleeding -Provided them with a sample cup to see if he is able to produce a specimen. Obviously, these results may be limited to interpretation the longer the sample remains in room air -Discontinued amitriptyline. He is on a low dose and would expect improvement over the next week and his symptoms and with dialysis.  ADDENDUM 01/19/2016  1:22 PM:  Urinalysis without nitrites or leukocytes which would be consistent with infection. I left a voicemail to that effect per my conversation with her that I had earlier.

## 2016-01-19 NOTE — Assessment & Plan Note (Signed)
Assessment He was hospitalized 12/28/15 through 01/10/16 after being found to have ileocolic intussusception on abdominal CT. He underwent exploratory laparotomy and ileocecectomy.    He was hospitalized 01/09/16 through 01/11/16 for hematochezia. His hemoglobin improved to 8.1 from 5.9 with RBC transfusions and was without hematochezia for at least 48 hours prior to discharge.  Since discharge, he has not had any bloody bowel movements. His appetite is fair, and he feels his energy is good.  Plan -Check CBC today  ADDENDUM 01/19/2016  8:38 AM:  CBC was notable for hemoglobin 7.5, down from 8.1 one week ago. I will see if we can recheck a CBC at outpatient dialysis. If his Hb is <7.5, he will need to come to the ED for further workup.  I spoke with PA Amalia Hailey who told me that they will repeat CBC. She told me they check it weekly anyhow though may not have done so if he missed his dialysis session on Monday.

## 2016-01-22 DIAGNOSIS — Z23 Encounter for immunization: Secondary | ICD-10-CM | POA: Diagnosis not present

## 2016-01-22 DIAGNOSIS — N2581 Secondary hyperparathyroidism of renal origin: Secondary | ICD-10-CM | POA: Diagnosis not present

## 2016-01-22 DIAGNOSIS — D509 Iron deficiency anemia, unspecified: Secondary | ICD-10-CM | POA: Diagnosis not present

## 2016-01-22 DIAGNOSIS — N186 End stage renal disease: Secondary | ICD-10-CM | POA: Diagnosis not present

## 2016-01-24 ENCOUNTER — Ambulatory Visit (INDEPENDENT_AMBULATORY_CARE_PROVIDER_SITE_OTHER): Payer: BLUE CROSS/BLUE SHIELD | Admitting: Neurology

## 2016-01-24 DIAGNOSIS — R569 Unspecified convulsions: Secondary | ICD-10-CM | POA: Diagnosis not present

## 2016-01-24 NOTE — Progress Notes (Signed)
Internal Medicine Clinic Attending  Case discussed with Dr. Patel,Rushil soon after the resident saw the patient.  We reviewed the resident's history and exam and pertinent patient test results.  I agree with the assessment, diagnosis, and plan of care documented in the resident's note. 

## 2016-01-25 DIAGNOSIS — N2581 Secondary hyperparathyroidism of renal origin: Secondary | ICD-10-CM | POA: Diagnosis not present

## 2016-01-25 DIAGNOSIS — D509 Iron deficiency anemia, unspecified: Secondary | ICD-10-CM | POA: Diagnosis not present

## 2016-01-25 DIAGNOSIS — N186 End stage renal disease: Secondary | ICD-10-CM | POA: Diagnosis not present

## 2016-01-25 DIAGNOSIS — Z23 Encounter for immunization: Secondary | ICD-10-CM | POA: Diagnosis not present

## 2016-01-26 ENCOUNTER — Telehealth: Payer: Self-pay | Admitting: Neurology

## 2016-01-26 NOTE — Telephone Encounter (Signed)
Left VM regarding EEG and MRI results. EEG normal. MRI did not show any tumor, stroke, or bleed. It showed changes seen post-seizure. Continue same dose Keppra.

## 2016-01-26 NOTE — Procedures (Signed)
ELECTROENCEPHALOGRAM REPORT  Date of Study: 01/24/2016  Patient's Name: Jim Wyatt MRN: [id number] Date of Birth: 10/04/1980  Referring Provider: Dr. Ellouise Newer  Clinical History: This is a 35 year old man with HIV, CKD, with new onset seizure last 10/2015. He initially started having myoclonic jerks followed by a witnessed convulsion.  Medications: Keppra, Ziagen, Tylenol, Proventil, Elavil, Zithromax, Coreg, Cartia, Prezcobix, Tivicay, Apresoline,Cozaar  Technical Summary: A multichannel digital 1-hour sleep-deprived EEG recording measured by the international 10-20 system with electrodes applied with paste and impedances below 5000 ohms performed in our laboratory with EKG monitoring in an awake and asleep patient.  Hyperventilation and photic stimulation were performed.  The digital EEG was referentially recorded, reformatted, and digitally filtered in a variety of bipolar and referential montages for optimal display.    Description: The patient is awake and asleep during the recording.  During maximal wakefulness, there is a symmetric, medium voltage 10 Hz posterior dominant rhythm that attenuates with eye opening.  The record is symmetric.  During drowsiness and sleep, there is an increase in theta slowing of the background.  Vertex waves and symmetric sleep spindles were seen.  Hyperventilation and photic stimulation did not elicit any abnormalities.  There were no epileptiform discharges or electrographic seizures seen.    EKG lead was unremarkable.  Impression: This 1-hour awake and asleep EEG is normal.    Clinical Correlation: A normal EEG does not exclude a clinical diagnosis of epilepsy.  If further clinical questions remain, prolonged EEG may be helpful.  Clinical correlation is advised.   Ellouise Newer, M.D.

## 2016-01-27 DIAGNOSIS — D509 Iron deficiency anemia, unspecified: Secondary | ICD-10-CM | POA: Diagnosis not present

## 2016-01-27 DIAGNOSIS — N2581 Secondary hyperparathyroidism of renal origin: Secondary | ICD-10-CM | POA: Diagnosis not present

## 2016-01-27 DIAGNOSIS — N186 End stage renal disease: Secondary | ICD-10-CM | POA: Diagnosis not present

## 2016-01-27 DIAGNOSIS — N049 Nephrotic syndrome with unspecified morphologic changes: Secondary | ICD-10-CM | POA: Diagnosis not present

## 2016-01-27 DIAGNOSIS — Z23 Encounter for immunization: Secondary | ICD-10-CM | POA: Diagnosis not present

## 2016-01-27 DIAGNOSIS — Z992 Dependence on renal dialysis: Secondary | ICD-10-CM | POA: Diagnosis not present

## 2016-01-29 DIAGNOSIS — D509 Iron deficiency anemia, unspecified: Secondary | ICD-10-CM | POA: Diagnosis not present

## 2016-01-29 DIAGNOSIS — N186 End stage renal disease: Secondary | ICD-10-CM | POA: Diagnosis not present

## 2016-01-29 DIAGNOSIS — R509 Fever, unspecified: Secondary | ICD-10-CM | POA: Diagnosis not present

## 2016-01-29 DIAGNOSIS — D631 Anemia in chronic kidney disease: Secondary | ICD-10-CM | POA: Diagnosis not present

## 2016-01-29 DIAGNOSIS — N2581 Secondary hyperparathyroidism of renal origin: Secondary | ICD-10-CM | POA: Diagnosis not present

## 2016-02-01 ENCOUNTER — Observation Stay (HOSPITAL_COMMUNITY)
Admission: EM | Admit: 2016-02-01 | Discharge: 2016-02-02 | Disposition: A | Discharge status: Home / Self Care | Payer: Medicare Other | Attending: Internal Medicine | Admitting: Internal Medicine

## 2016-02-01 ENCOUNTER — Emergency Department (HOSPITAL_COMMUNITY): Payer: Medicare Other

## 2016-02-01 ENCOUNTER — Encounter (HOSPITAL_COMMUNITY): Payer: Self-pay | Admitting: Emergency Medicine

## 2016-02-01 DIAGNOSIS — Z992 Dependence on renal dialysis: Secondary | ICD-10-CM | POA: Diagnosis not present

## 2016-02-01 DIAGNOSIS — D649 Anemia, unspecified: Secondary | ICD-10-CM | POA: Diagnosis not present

## 2016-02-01 DIAGNOSIS — G40909 Epilepsy, unspecified, not intractable, without status epilepticus: Secondary | ICD-10-CM | POA: Diagnosis not present

## 2016-02-01 DIAGNOSIS — R109 Unspecified abdominal pain: Secondary | ICD-10-CM

## 2016-02-01 DIAGNOSIS — E43 Unspecified severe protein-calorie malnutrition: Secondary | ICD-10-CM | POA: Insufficient documentation

## 2016-02-01 DIAGNOSIS — D61818 Other pancytopenia: Principal | ICD-10-CM | POA: Insufficient documentation

## 2016-02-01 DIAGNOSIS — J45909 Unspecified asthma, uncomplicated: Secondary | ICD-10-CM | POA: Diagnosis not present

## 2016-02-01 DIAGNOSIS — Z881 Allergy status to other antibiotic agents status: Secondary | ICD-10-CM | POA: Insufficient documentation

## 2016-02-01 DIAGNOSIS — I120 Hypertensive chronic kidney disease with stage 5 chronic kidney disease or end stage renal disease: Secondary | ICD-10-CM | POA: Diagnosis not present

## 2016-02-01 DIAGNOSIS — R197 Diarrhea, unspecified: Secondary | ICD-10-CM | POA: Diagnosis not present

## 2016-02-01 DIAGNOSIS — Z882 Allergy status to sulfonamides status: Secondary | ICD-10-CM | POA: Diagnosis not present

## 2016-02-01 DIAGNOSIS — Z880 Allergy status to penicillin: Secondary | ICD-10-CM | POA: Diagnosis not present

## 2016-02-01 DIAGNOSIS — Z9119 Patient's noncompliance with other medical treatment and regimen: Secondary | ICD-10-CM | POA: Insufficient documentation

## 2016-02-01 DIAGNOSIS — B20 Human immunodeficiency virus [HIV] disease: Secondary | ICD-10-CM | POA: Diagnosis present

## 2016-02-01 DIAGNOSIS — N186 End stage renal disease: Secondary | ICD-10-CM | POA: Diagnosis not present

## 2016-02-01 DIAGNOSIS — Z86718 Personal history of other venous thrombosis and embolism: Secondary | ICD-10-CM | POA: Insufficient documentation

## 2016-02-01 LAB — LIPASE, BLOOD: Lipase: 52 U/L — ABNORMAL HIGH (ref 11–51)

## 2016-02-01 LAB — COMPREHENSIVE METABOLIC PANEL
ALT: 11 U/L — ABNORMAL LOW (ref 17–63)
AST: 40 U/L (ref 15–41)
Albumin: 2.2 g/dL — ABNORMAL LOW (ref 3.5–5.0)
Alkaline Phosphatase: 33 U/L — ABNORMAL LOW (ref 38–126)
Anion gap: 12 (ref 5–15)
BUN: 61 mg/dL — ABNORMAL HIGH (ref 6–20)
CO2: 28 mmol/L (ref 22–32)
Calcium: 7.6 mg/dL — ABNORMAL LOW (ref 8.9–10.3)
Chloride: 93 mmol/L — ABNORMAL LOW (ref 101–111)
Creatinine, Ser: 14.07 mg/dL — ABNORMAL HIGH (ref 0.61–1.24)
GFR calc Af Amer: 5 mL/min — ABNORMAL LOW (ref >60)
GFR calc non Af Amer: 4 mL/min — ABNORMAL LOW (ref >60)
Glucose, Bld: 98 mg/dL (ref 65–99)
Potassium: 4.5 mmol/L (ref 3.5–5.1)
Sodium: 133 mmol/L — ABNORMAL LOW (ref 135–145)
Total Bilirubin: 0.6 mg/dL (ref 0.3–1.2)
Total Protein: 8 g/dL (ref 6.5–8.1)

## 2016-02-01 LAB — CBC
HCT: 25.7 % — ABNORMAL LOW (ref 39.0–52.0)
Hemoglobin: 8.3 g/dL — ABNORMAL LOW (ref 13.0–17.0)
MCH: 28.9 pg (ref 26.0–34.0)
MCHC: 32.3 g/dL (ref 30.0–36.0)
MCV: 89.5 fL (ref 78.0–100.0)
Platelets: 115 10*3/uL — ABNORMAL LOW (ref 150–400)
RBC: 2.87 MIL/uL — ABNORMAL LOW (ref 4.22–5.81)
RDW: 17.3 % — ABNORMAL HIGH (ref 11.5–15.5)
WBC: 2.7 10*3/uL — ABNORMAL LOW (ref 4.0–10.5)

## 2016-02-01 LAB — D-DIMER, QUANTITATIVE: D-Dimer, Quant: 2.15 ug/mL-FEU — ABNORMAL HIGH (ref 0.00–0.50)

## 2016-02-01 LAB — CBC WITH DIFFERENTIAL/PLATELET
Basophils Absolute: 0 10*3/uL (ref 0.0–0.1)
Basophils Relative: 1 %
Eosinophils Absolute: 0 10*3/uL (ref 0.0–0.7)
Eosinophils Relative: 0 %
HCT: 20.8 % — ABNORMAL LOW (ref 39.0–52.0)
Hemoglobin: 6.7 g/dL — CL (ref 13.0–17.0)
Lymphocytes Relative: 21 %
Lymphs Abs: 0.8 10*3/uL (ref 0.7–4.0)
MCH: 29.3 pg (ref 26.0–34.0)
MCHC: 32.2 g/dL (ref 30.0–36.0)
MCV: 90.8 fL (ref 78.0–100.0)
Monocytes Absolute: 0.5 10*3/uL (ref 0.1–1.0)
Monocytes Relative: 12 %
Neutro Abs: 2.6 10*3/uL (ref 1.7–7.7)
Neutrophils Relative %: 66 %
Platelets: 97 10*3/uL — ABNORMAL LOW (ref 150–400)
RBC: 2.29 MIL/uL — ABNORMAL LOW (ref 4.22–5.81)
RDW: 17.6 % — ABNORMAL HIGH (ref 11.5–15.5)
WBC: 3.9 10*3/uL — ABNORMAL LOW (ref 4.0–10.5)

## 2016-02-01 LAB — PREPARE RBC (CROSSMATCH)

## 2016-02-01 LAB — PROTIME-INR
INR: 0.98
Prothrombin Time: 13 seconds (ref 11.4–15.2)

## 2016-02-01 LAB — LACTIC ACID, PLASMA
Lactic Acid, Venous: 0.6 mmol/L (ref 0.5–1.9)
Lactic Acid, Venous: 0.4 mmol/L — ABNORMAL LOW (ref 0.5–1.9)

## 2016-02-01 LAB — FERRITIN: Ferritin: 1050 ng/mL — ABNORMAL HIGH (ref 24–336)

## 2016-02-01 LAB — APTT: aPTT: 46 seconds — ABNORMAL HIGH (ref 24–36)

## 2016-02-01 LAB — LACTATE DEHYDROGENASE: LDH: 356 U/L — ABNORMAL HIGH (ref 98–192)

## 2016-02-01 MED ORDER — IOPAMIDOL (ISOVUE-300) INJECTION 61%
INTRAVENOUS | Status: AC
  Filled 2016-02-01: qty 100

## 2016-02-01 MED ORDER — LOSARTAN POTASSIUM 50 MG PO TABS
100.0000 mg | ORAL_TABLET | Freq: Every day | ORAL | Status: DC
  Filled 2016-02-01: qty 2

## 2016-02-01 MED ORDER — LEVETIRACETAM 250 MG PO TABS: 250.0000 mg | ORAL_TABLET | ORAL | Status: DC

## 2016-02-01 MED ORDER — DAPSONE 100 MG PO TABS
100.0000 mg | ORAL_TABLET | Freq: Every day | ORAL | Status: DC
  Administered 2016-02-02: 100 mg via ORAL
  Filled 2016-02-01 (×2): qty 1

## 2016-02-01 MED ORDER — SUCROFERRIC OXYHYDROXIDE 500 MG PO CHEW: 500.0000 mg | CHEWABLE_TABLET | ORAL | Status: DC

## 2016-02-01 MED ORDER — CARVEDILOL 25 MG PO TABS
25.0000 mg | ORAL_TABLET | Freq: Two times a day (BID) | ORAL | Status: DC
  Administered 2016-02-01 – 2016-02-02 (×2): 25 mg via ORAL
  Filled 2016-02-01 (×2): qty 1

## 2016-02-01 MED ORDER — LEVETIRACETAM 500 MG PO TABS: 500.0000 mg | ORAL_TABLET | ORAL | Status: DC

## 2016-02-01 MED ORDER — DOLUTEGRAVIR SODIUM 50 MG PO TABS
50.0000 mg | ORAL_TABLET | Freq: Every evening | ORAL | Status: DC
  Administered 2016-02-01: 50 mg via ORAL
  Filled 2016-02-01: qty 1

## 2016-02-01 MED ORDER — ALBUTEROL SULFATE (2.5 MG/3ML) 0.083% IN NEBU: 3.0000 mL | INHALATION_SOLUTION | Freq: Four times a day (QID) | RESPIRATORY_TRACT | Status: DC | PRN

## 2016-02-01 MED ORDER — VITAMIN C 250 MG PO CHEW: 250.0000 mg | CHEWABLE_TABLET | Freq: Every day | ORAL | Status: DC

## 2016-02-01 MED ORDER — DARUNAVIR-COBICISTAT 800-150 MG PO TABS
1.0000 | ORAL_TABLET | Freq: Every evening | ORAL | Status: DC
  Administered 2016-02-01: 1 via ORAL
  Filled 2016-02-01 (×2): qty 1

## 2016-02-01 MED ORDER — VITAMIN B-12 1000 MCG PO TABS
5000.0000 ug | ORAL_TABLET | Freq: Every day | ORAL | Status: DC
  Filled 2016-02-01: qty 5

## 2016-02-01 MED ORDER — ONDANSETRON HCL 4 MG PO TABS
8.0000 mg | ORAL_TABLET | Freq: Once | ORAL | Status: AC
  Administered 2016-02-01: 8 mg via ORAL
  Filled 2016-02-01: qty 2

## 2016-02-01 MED ORDER — DILTIAZEM HCL ER COATED BEADS 180 MG PO CP24
180.0000 mg | ORAL_CAPSULE | Freq: Every day | ORAL | Status: DC
  Administered 2016-02-02: 180 mg via ORAL
  Filled 2016-02-01: qty 1

## 2016-02-01 MED ORDER — SODIUM CHLORIDE 0.9 % IV BOLUS (SEPSIS)
1000.0000 mL | Freq: Once | INTRAVENOUS | Status: AC
  Administered 2016-02-01: 1000 mL via INTRAVENOUS

## 2016-02-01 MED ORDER — ONDANSETRON 4 MG PO TBDP: 8.0000 mg | ORAL_TABLET | Freq: Three times a day (TID) | ORAL | Status: DC | PRN

## 2016-02-01 MED ORDER — ABACAVIR SULFATE 300 MG PO TABS
600.0000 mg | ORAL_TABLET | Freq: Every day | ORAL | Status: DC
  Administered 2016-02-01: 600 mg via ORAL
  Filled 2016-02-01 (×2): qty 2

## 2016-02-01 MED ORDER — HYDRALAZINE HCL 50 MG PO TABS
100.0000 mg | ORAL_TABLET | Freq: Three times a day (TID) | ORAL | Status: DC
  Administered 2016-02-01 – 2016-02-02 (×3): 100 mg via ORAL
  Filled 2016-02-01 (×4): qty 2

## 2016-02-01 MED ORDER — SODIUM CHLORIDE 0.9 % IV SOLN: 10.0000 mL/h | Freq: Once | INTRAVENOUS | Status: DC

## 2016-02-01 NOTE — Progress Notes (Signed)
Patient refused for me to assess his abdominal area.

## 2016-02-01 NOTE — ED Notes (Signed)
Critical Lab: Hemoglobin 6.7 MD made aware

## 2016-02-01 NOTE — ED Notes (Signed)
Pt refuses cardiac monitoring  

## 2016-02-01 NOTE — ED Notes (Signed)
Patient undressed, in gown, on continuous pulse oximetry and blood pressure cuff 

## 2016-02-01 NOTE — ED Provider Notes (Signed)
[facility] DEPT Provider Note   CSN: [id number] Arrival date & time: 02/01/16  5093     History   Chief Complaint Chief Complaint  Patient presents with  . Abdominal Pain    HPI Jim Wyatt is a 35 y.o. male.  Patient is a 35 year old male with multiple chronic medical conditions as listed below including ESRD on hemodialysis, HIV, anemia of chronic disease, recent ex lap and ileocecectomy on 10/31 secondary to intussusception, presenting with right sided abdominal pain starting on Saturday. He also reports low grade fevers at his last dialysis appointment on Saturday. He also developed mild diarrhea starting today, but denies any nausea, vomiting, or blood in his stools. He was started on azithromycin, which he's been taking once each week as directed. He tried Tylenol for relief of the abdominal pain, but it was minimally effective. He has mild shortness of breath with exertion, but denies any chest pain, cough, headache, or dizziness. He has a follow up appointment with his PCP on Thursday, and general surgeon on Friday this week.      Past Medical History:  Diagnosis Date  . Asthma   . CAP (community acquired pneumonia) 10/2013  . Cellulitis 11/2013   LLE  . DVT (deep venous thrombosis) ([place]) 10/2013   BLE  . 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 Went on HD Dec 2016.   [name] Fever   . Headache   . HIV infection ([place]) dx'd 10/2013  . Hypertension   . Leg pain   . Multiple environmental allergies    "trees, dogs, cats"  . Multiple food allergies   . Shingles < 2010  . 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 Dysuria 01/18/2016  . Hematochezia 01/09/2016  . Postoperative anemia due to acute blood loss 01/09/2016  . Seizure disorder ([place]) 12/29/2015  . Intussusception ([place]) 12/28/2015  . Hypertension 12/28/2015  . Otitis externa of both ears  11/21/2015  . AIDS (acquired immunodeficiency syndrome), CD4 <=200 ([place])   . Meningitis   . Cephalalgia   . Blurry vision, bilateral   . Headache   . Hypertensive urgency   . New onset of headache in immunocompromised patient ([place]) 07/06/2015  . ESRD on dialysis ([place]) 04/27/2015  . CAP (community acquired pneumonia) 02/25/2015  . ESRD (end stage renal disease) ([place]) 02/16/2015  . History of DVT (deep vein thrombosis) 01/12/2015  . Candidal esophagitis ([place])   . Constipation   . Nausea without vomiting 12/20/2014  . Abscess of right thigh 12/19/2014  . Chronic kidney disease (CKD), stage IV (severe) ([place]) 12/19/2014  . 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 Anemia due to end stage renal disease ([place]) 12/19/2014  . Hyponatremia 12/19/2014  . Community acquired pneumonia 07/30/2014  . Anemia of chronic disease 07/30/2014  . Acute on chronic renal failure ([place])   . 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 Human immunodeficiency virus (HIV) disease ([place]) 12/11/2013  . CMV (cytomegalovirus infection) ([place]) 12/07/2013  . Protein-calorie malnutrition, severe ([place]) 12/06/2013  . 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 DVT (deep venous thrombosis) ([place]) 11/24/2013  . Asthma, chronic 11/24/2013  . Acquired immune deficiency syndrome ([place]) 11/14/2013  . AKI (acute kidney injury) ([place]) 11/11/2013  . Elevated liver enzymes 11/09/2013  . History of shingles 11/09/2013  . Obesity 11/09/2013  . Asthma     Past Surgical History:  Procedure Laterality Date  . AV FISTULA PLACEMENT Left 03/03/2015   Procedure: Creation of Left Arm RADIOCEPHALIC ARTERIOVENOUS FISTULA ;  Surgeon: Mal Misty, MD;  Location: [facility];  Service: Vascular;  Laterality: Left;  . FEMUR FRACTURE SURGERY Right 09/2001  . FRACTURE SURGERY    . I&D EXTREMITY Right 12/18/2014   Procedure: IRRIGATION AND DEBRIDEMENT RIGHT LOWER EXTREMITY;  Surgeon: Ralene Ok, MD;   Location: [facility];  Service: General;  Laterality: Right;  . INSERTION OF DIALYSIS CATHETER  2016  . LAPAROTOMY N/A 12/28/2015   Procedure: EXPLORATORY LAPAROTOMY BOWEL RESECTION ILEOCECECTOMY;  Surgeon: Clovis Riley, MD;  Location: [facility];  Service: General;  Laterality: N/A;       Home Medications    Prior to Admission medications   Medication Sig Start Date End Date Taking? Authorizing Provider  abacavir (ZIAGEN) 300 MG tablet Take 2 tablets (600 mg total) by mouth daily with supper. 12/14/15   Carlyle Basques, MD  acetaminophen (TYLENOL) 500 MG tablet Take 1,000 mg by mouth every 6 (six) hours as needed (pain).    Historical Provider, MD  albuterol (PROVENTIL HFA;VENTOLIN HFA) 108 (90 Base) MCG/ACT inhaler Inhale 2 puffs into the lungs every 6 (six) hours as needed for wheezing or shortness of breath. 01/04/16   Asencion Partridge, MD  Ascorbic Acid (VITAMIN C) 250 MG CHEW Chew 250 mg by mouth daily.    Historical Provider, MD  azithromycin (ZITHROMAX) 600 MG tablet Take 2 tablets (1,200 mg total) by mouth once a week. On Mondays 01/11/16   Asencion Partridge, MD  CARTIA XT 180 MG 24 hr capsule Take 180 mg by mouth daily. 12/11/15   Historical Provider, MD  carvedilol (COREG) 25 MG tablet Take 25 mg by mouth 2 (two) times daily. 12/02/15   Historical Provider, MD  Cyanocobalamin 2500 MCG CHEW Chew 5,000 mcg by mouth daily.    Historical Provider, MD  dapsone 100 MG tablet Take 1 tablet (100 mg total) by mouth daily. 11/24/15   Alexa Angela [place], MD  darunavir-cobicistat (PREZCOBIX) 800-150 MG tablet Take 1 tablet by mouth every evening. Swallow whole. Do NOT crush, break or chew tablets. Take with food. 12/14/15   Carlyle Basques, MD  dolutegravir (TIVICAY) 50 MG tablet Take 1 tablet (50 mg total) by mouth every evening. 12/14/15   Carlyle Basques, MD  hydrALAZINE (APRESOLINE) 100 MG tablet Take 0.5 tablets (50 mg total) by mouth 2 (two) times daily. 6:30am, 2:30pm, 11pm - for high blood pressure 01/11/16    Asencion Partridge, MD  HYDROcodone-acetaminophen (NORCO/VICODIN) 5-325 MG tablet Take 1-2 tablets by mouth every 6 (six) hours as needed for moderate pain or severe pain. 01/04/16   Asencion Partridge, MD  levETIRAcetam (KEPPRA) 250 MG tablet Take 1 tablet (250 mg total) by mouth See admin instructions. Take 1 tablet after your dialysis treatments. Patient taking differently: Take 250 mg by mouth See admin instructions. Take 1 tablet (250 mg) by mouth on Tuesday, Thursday, Saturday at 7pm (dialysis days) 11/05/15   Nona Dell, PA-C  levETIRAcetam (KEPPRA) 500 MG tablet Take 500 mg by mouth See admin instructions. Take 1 tablet (500 mg) by mouth Sunday, Monday, Wednesday, Friday at 7pm (non-dialysis days)    Historical Provider, MD  losartan (COZAAR) 100 MG tablet Take 100 mg by mouth at bedtime.    Historical Provider, MD  ondansetron (ZOFRAN ODT) 8 MG disintegrating tablet Take 1 tablet (8 mg total) by mouth every 8 (eight) hours as needed for nausea or  vomiting. 10/06/15   Carlyle Basques, MD  sucroferric oxyhydroxide (VELPHORO) 500 MG chewable tablet Chew 500 mg by mouth See admin instructions. Chew 1 tablet (500 mg) by mouth after every meal (2-3 times daily)    Historical Provider, MD    Family History Family History  Problem Relation Age of Onset  . Hypertension Mother   . Diabetes Maternal Grandmother   . Kidney disease Maternal Grandmother     Social History Social History  Substance Use Topics  . Smoking status: Never Smoker  . Smokeless tobacco: Never Used  . Alcohol use No     Comment: I stopped drinking along time ago "     Allergies   Bactrim [sulfamethoxazole-trimethoprim]; Vancomycin; Reglan [metoclopramide]; Amlodipine; and Augmentin [amoxicillin-pot clavulanate]   Review of Systems Review of Systems  Constitutional: Positive for fever. Negative for chills.  HENT: Negative for ear pain and sore throat.   Eyes: Negative for pain and visual disturbance.  Respiratory:  Positive for shortness of breath. Negative for cough.   Cardiovascular: Negative for chest pain, palpitations and leg swelling.  Gastrointestinal: Positive for abdominal pain and diarrhea. Negative for blood in stool, nausea and vomiting.  Genitourinary: Negative for dysuria, flank pain and hematuria.  Musculoskeletal: Negative for back pain and neck pain.  Skin: Negative for color change and rash.  Neurological: Negative for dizziness, seizures, syncope, weakness, numbness and headaches.     Physical Exam Updated Vital Signs BP 141/88 (BP Location: Right Arm)   Pulse 92   Temp 99.1 F (37.3 C) (Oral)   Resp 18   SpO2 95%   Physical Exam  Constitutional: No distress.  Thin male, lying comfortably in bed, non-toxic appearance.  HENT:  Head: Normocephalic and atraumatic.  Mouth/Throat: Oropharynx is clear and moist.  Eyes: Conjunctivae and EOM are normal. Pupils are equal, round, and reactive to light.  Neck: Normal range of motion. Neck supple.  No nuchal rigidity or meningeal signs.  Cardiovascular: Normal rate, regular rhythm, normal heart sounds and intact distal pulses.   Pulmonary/Chest: Effort normal and breath sounds normal. No respiratory distress. He has no wheezes. He has no rales. He exhibits no tenderness.  Abdominal: Soft. Bowel sounds are normal. He exhibits no distension. There is tenderness. There is no guarding.  TTP RLQ, with mild tenderness at right flank. Negative Murphy's, negative McBurney's, no CVA tenderness. No peritoneal signs. Surgical incision clean, dry, and well approximated. No drainage, erythema, or warmth noted.  Musculoskeletal: Normal range of motion. He exhibits no edema or tenderness.  Lymphadenopathy:    He has no cervical adenopathy.  Neurological: He is alert.  Skin: Skin is warm and dry.  Psychiatric: He has a normal mood and affect.  Nursing note and vitals reviewed.    ED Treatments / Results  Labs (all labs ordered are listed, but  only abnormal results are displayed) Labs Reviewed  COMPREHENSIVE METABOLIC PANEL  CBC WITH DIFFERENTIAL/PLATELET  PREGNANCY, URINE  LACTIC ACID, PLASMA  LACTIC ACID, PLASMA  LIPASE, BLOOD    EKG  EKG Interpretation None       Radiology Ct Abdomen Pelvis Wo Contrast  Result Date: 02/01/2016 CLINICAL DATA:  Right side abdominal pain for 4 days, diarrhea, status post ileocecal ectomy EXAM: CT ABDOMEN AND PELVIS WITHOUT CONTRAST TECHNIQUE: Multidetector CT imaging of the abdomen and pelvis was performed following the standard protocol without IV contrast. COMPARISON:  12/28/2015 FINDINGS: Lower chest: Lung bases are normal.  Cardiomegaly is noted. Hepatobiliary: Unenhanced liver shows no biliary  ductal dilatation. Gallbladder is contracted without evidence of calcified gallstones. Trace perihepatic ascites Pancreas: Unenhanced pancreas appears normal. Spleen: Borderline splenomegaly. The spleen measures 12.3 cm in length. Adrenals/Urinary Tract: No adrenal gland mass. Unenhanced kidneys are symmetrical in size. No nephrolithiasis. No hydronephrosis or hydroureter. The urinary bladder is under distended. Stomach/Bowel: No small bowel obstruction. There are postsurgical changes with sutures post ileocecal resection in right lower quadrant. No definite colonic obstruction is noted on this unenhanced scan. Some liquid stool/ air-fluid level noted within transverse colon. Diarrhea cannot be excluded. Vascular/Lymphatic: No aortic aneurysm. No definite mesenteric adenopathy is noted on this unenhanced scan. There is a right inguinal lymph node measures 1.5 cm. Left inguinal lymph node measures 1.4 cm. This might be reactive. Clinical correlation is necessary Reproductive: Prostate gland is unremarkable. Other: There is diffuse mesenteric edema. Mild anasarca infiltration subcutaneous fat abdominal and pelvic wall. No free abdominal air. Postsurgical changes scarring midline anterior abdominal wall.  Musculoskeletal: No destructive bony lesions are noted. Sagittal images of the spine are unremarkable. IMPRESSION: 1. Cardiomegaly is noted. 2. Borderline splenomegaly. 3. Postsurgical changes are noted post ileocecal resection in right lower quadrant. No small bowel or colonic obstruction. Some liquids stool air-fluid level noted within transverse colon. Diarrhea cannot be excluded. 4. Diffuse mild mesenteric edema/infiltration is noted. There is anasarca mild infiltration subcutaneous fat abdominal and pelvic wall. 5. There are enlarged bilateral inguinal lymph nodes. Although this might be reactive lymphoproliferative disease cannot be excluded. Clinical correlation is necessary. Electronically Signed   By: Lahoma Crocker M.D.   On: 02/01/2016 10:33    Procedures Procedures (including critical care time)  Medications Ordered in ED Medications  sodium chloride 0.9 % bolus 1,000 mL (not administered)     Initial Impression / Assessment and Plan / ED Course  I have reviewed the triage vital signs and the nursing notes.  Pertinent labs & imaging results that were available during my care of the patient were reviewed by me and considered in my medical decision making (see chart for details).  Clinical Course    Patient is 35 yo M who had recent ex lap and ileocecectomy on 10/31, presenting with right sided abdominal pain. He also reports low grade fever, mild diarrhea, and shortness of breath with exertion. He denies any blood in his stools. His breath sounds are CTA bilaterally. Abdominal TTP noted on right side, but his surgical incision is healing well with no evidence of infection. Patient refused any pain meds or antiemetics. CMP unremarkable from baseline, but hgb of 6.7 noted on CBC, down from baseline of 7.5. IVF given and CT abdomen ordered but showed no acute pathology. However, given symptomatic anemia and multiple comorbidities, consult to hospitalist placed for admission, who agreed to  admit patient for observation after receiving 1 unit PRBCs. Patient agreeable to receive blood, but did not want to be admitted and threatened to leave AMA. However, after a lengthy discussion about the risks of leaving including death, patient agreed to stay and admitting team graciously evaluated him in the ED.  Final Clinical Impressions(s) / ED Diagnoses   Final diagnoses:  Symptomatic anemia  Right sided abdominal pain    New Prescriptions New Prescriptions   No medications on file     Rosilyn Mings II, [place] 02/01/16 2218    Leo Grosser, MD 02/02/16 1139

## 2016-02-01 NOTE — H&P (Signed)
Date: 02/01/2016               Patient Name:  Jim Wyatt MRN: [id number]  DOB: [birth year] Age / Sex: 35 y.o., male   PCP: [name], MD         Medical Service: Internal Medicine Teaching Service         Attending Physician: Dr. Lucious Groves, DO    First Contact: Dr. Holley Raring Pager: [phone]  Second Contact: Dr. Berline Lopes Pager: [phone]       After Hours (After 5p/  First Contact Pager: [phone]  weekends / holidays): Second Contact Pager: [phone]   Chief Complaint: Symptomatic Anemia  History of Present Illness: [name] is a 35 y.o. male with a h/o of PMH recent ileocecectomy for intussusception, HIV/AIDS (CD4 10 on 01/03/16), ESRD on HD, siezure d/o and HTN who presents with RLQ abdominal pain, diarrhea, and fatigue. Pt was recently discharged on 01/11/2016 for hematochezia in the setting of his recent ileocecectomy for intussusception. Patient reports since that time is feeling well and has had no complaints until this Saturday 4 days ago. At this time he started to experience right lower quadrant abdominal pain and this pain is steadily worsened over the last several days. Today patient reports having watery diarrhea, but he denies any blood or mucus in his stool. Patient reports at he is felt mildly feverish at home, but denies any chills, headaches, headedness or dizziness, chest pain, shortness of breath. Patient reports that she became concerned of an infection and presented to the emergency department.  In the emergency department patient was found to be anemic with a hemoglobin of 6.7. At this time the patient's discharge on 11/14 his hemoglobin was 8.1, and at this hospital follow-up on 11/21 it was 7.5. In light of the patient's steadily declining hemoglobin, without evidence of GI or other bleeding, patient was admitted for further evaluation. He received one unit of packed red blood cells. Patient was also found to be leukopenic with a  WBC count of 3.9 (2 weeks prior 8.5) and thrombocytopenic with platelet of 97 (2 weeks prior to 154).  He was evaluated with CT abdomen pelvis which demonstrated mild mesenteric edema diffusely, and bilateral inguinal lymph nodes which may be reactive versus infectious. No bowel obstruction or acute infection was noted.  Patient reports that he feels "fine" and was disappointed at the suggestion that he needed to be admitted for observation. He reports that he would like to find out what his labs show following his transfusion can be discharged home. However upon further conversation he was agreeable to admission for observation. He reports that he has been compliant with his home antiretroviral therapy. He denies any sick contacts recently.  Meds: Current Facility-Administered Medications  Medication Dose Route Frequency Provider Last Rate Last Dose  . 0.9 %  sodium chloride infusion  10 mL/hr Intravenous Once Daryl F de Villier II, PA      . iopamidol (ISOVUE-300) 61 % injection            Current Outpatient Prescriptions  Medication Sig Dispense Refill  . abacavir (ZIAGEN) 300 MG tablet Take 2 tablets (600 mg total) by mouth daily with supper. 60 tablet 11  . acetaminophen (TYLENOL) 500 MG tablet Take 1,000 mg by mouth every 6 (six) hours as needed (pain).    [name] albuterol (PROVENTIL HFA;VENTOLIN HFA) 108 (90 Base) MCG/ACT inhaler Inhale 2 puffs into the lungs every  6 (six) hours as needed for wheezing or shortness of breath. 1 Inhaler 2  . azithromycin (ZITHROMAX) 600 MG tablet Take 2 tablets (1,200 mg total) by mouth once a week. On Mondays 30 tablet 0  . CARTIA XT 180 MG 24 hr capsule Take 180 mg by mouth daily.  3  . carvedilol (COREG) 25 MG tablet Take 25 mg by mouth 2 (two) times daily.  12  . Cyanocobalamin 2500 MCG CHEW Chew 5,000 mcg by mouth daily.    . dapsone 100 MG tablet Take 1 tablet (100 mg total) by mouth daily. 30 tablet 11  . darunavir-cobicistat (PREZCOBIX) 800-150 MG  tablet Take 1 tablet by mouth every evening. Swallow whole. Do NOT crush, break or chew tablets. Take with food. 30 tablet 11  . dolutegravir (TIVICAY) 50 MG tablet Take 1 tablet (50 mg total) by mouth every evening. 30 tablet 11  . hydrALAZINE (APRESOLINE) 100 MG tablet Take 0.5 tablets (50 mg total) by mouth 2 (two) times daily. 6:30am, 2:30pm, 11pm - for high blood pressure (Patient taking differently: Take 100 mg by mouth 3 (three) times daily. ) 60 tablet 1  . levETIRAcetam (KEPPRA) 250 MG tablet Take 1 tablet (250 mg total) by mouth See admin instructions. Take 1 tablet after your dialysis treatments. (Patient taking differently: Take 250 mg by mouth See admin instructions. Take 1 tablet (250 mg) by mouth on Tuesday, Thursday, Saturday at 7pm (dialysis days)) 30 tablet 0  . levETIRAcetam (KEPPRA) 500 MG tablet Take 500 mg by mouth See admin instructions. Take 1 tablet (500 mg) by mouth Sunday, Monday, Wednesday, Friday at 7pm (non-dialysis days)    . losartan (COZAAR) 100 MG tablet Take 100 mg by mouth at bedtime.    . ondansetron (ZOFRAN ODT) 8 MG disintegrating tablet Take 1 tablet (8 mg total) by mouth every 8 (eight) hours as needed for nausea or vomiting. 30 tablet 11  . Ascorbic Acid (VITAMIN C) 250 MG CHEW Chew 250 mg by mouth daily.    [name] HYDROcodone-acetaminophen (NORCO/VICODIN) 5-325 MG tablet Take 1-2 tablets by mouth every 6 (six) hours as needed for moderate pain or severe pain. (Patient not taking: Reported on 02/01/2016) 15 tablet 0  . sucroferric oxyhydroxide (VELPHORO) 500 MG chewable tablet Chew 500 mg by mouth See admin instructions. Chew 1 tablet (500 mg) by mouth after every meal (2-3 times daily)      (Not in a hospital admission) Allergies: Allergies as of 02/01/2016 - Review Complete 02/01/2016  Allergen Reaction Noted  . Bactrim [sulfamethoxazole-trimethoprim] Other (See Comments) 11/14/2013  . Vancomycin Other (See Comments) 12/04/2013  . Reglan [metoclopramide] Other  (See Comments) 11/21/2015  . Amlodipine Palpitations and Other (See Comments) 01/12/2015  . Augmentin [amoxicillin-pot clavulanate] Hives 09/03/2013   Past Medical History:  Diagnosis Date  . Asthma   . CAP (community acquired pneumonia) 10/2013  . Cellulitis 11/2013   LLE  . DVT (deep venous thrombosis) ([place]) 10/2013   BLE  . 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 Went on HD Dec 2016.   [name] Fever   . Headache   . HIV infection ([place]) dx'd 10/2013  . Hypertension   . Leg pain   . Multiple environmental allergies    "trees, dogs, cats"  . Multiple food allergies   . Shingles < 2010  . 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  Hypertension Mother   . Diabetes Maternal Grandmother   . Kidney disease Maternal Grandmother    Social History: Pt lives with a roommate in [place]. Denies tobacco, drug and alcohol use. 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  Review of Systems: A complete ROS was negative except as per HPI. Review of Systems  Constitutional: Positive for fever and malaise/fatigue. Negative for chills, diaphoresis and weight loss.  Eyes: Negative for blurred vision.  Respiratory: Negative for cough, hemoptysis and shortness of breath.   Cardiovascular: Negative for chest pain and leg swelling.  Gastrointestinal: Positive for diarrhea. Negative for abdominal pain, blood in stool, constipation, melena, nausea and vomiting.  Genitourinary: Negative for dysuria, frequency and urgency.  Musculoskeletal: Negative for myalgias.  Skin: Negative for rash.  Neurological: Negative for dizziness, tremors, weakness and headaches.  Endo/Heme/Allergies: Negative for polydipsia.  Psychiatric/Behavioral: The patient is not nervous/anxious.    Physical Exam: Vitals:   02/01/16 1448 02/01/16 1515 02/01/16 1545 02/01/16 1615  BP: (!)  147/101     Pulse: 88 100 90 88  Resp: 18     Temp: 98.7 F (37.1 C)     TempSrc: Oral     SpO2: 93% 95% 92% 93%   Physical Exam  Constitutional: He is oriented to person, place, and time. He appears well-developed and well-nourished. He is cooperative. No distress.  HENT:  Head: Normocephalic and atraumatic.  Right Ear: Hearing normal.  Left Ear: Hearing normal.  Nose: Nose normal.  Mouth/Throat: Mucous membranes are normal.  Cardiovascular: Normal rate, regular rhythm, S1 normal, S2 normal and intact distal pulses.  Exam reveals no gallop.   No murmur heard. Pulmonary/Chest: Effort normal and breath sounds normal. No respiratory distress. He has no wheezes. He has no rhonchi. He has no rales. He exhibits no tenderness.  Abdominal: Soft. Normal appearance and bowel sounds are normal. He exhibits no ascites. There is no hepatosplenomegaly. There is tenderness in the right lower quadrant. There is no rigidity, no rebound, no guarding, no CVA tenderness and negative Murphy's sign.  Neurological: He is alert and oriented to person, place, and time. He has normal strength.  Skin: Skin is warm, dry and intact. He is not diaphoretic.  Psychiatric: He has a normal mood and affect. His speech is normal and behavior is normal.   Labs: CBC:  Recent Labs Lab 02/01/16 0821  WBC 3.9*  NEUTROABS 2.6  HGB 6.7*  HCT 20.8*  MCV 90.8  PLT 97*   Basic Metabolic Panel:  Recent Labs Lab 02/01/16 0821  NA 133*  K 4.5  CL 93*  CO2 28  GLUCOSE 98  BUN 61*  CREATININE 14.07*  CALCIUM 7.6*   Liver Function Tests:  Recent Labs Lab 02/01/16 0821  AST 40  ALT 11*  ALKPHOS 33*  BILITOT 0.6  PROT 8.0  ALBUMIN 2.2*    Recent Labs Lab 02/01/16 0821  LIPASE 52*   EKG: EKG Interpretation  Date/Time:  Tuesday February 01 2016 10:11:40 EST Ventricular Rate:  86 PR Interval:    QRS Duration: 90 QT Interval:  406 QTC Calculation: 486 R Axis:   105 Text Interpretation:  Sinus  rhythm Right axis deviation Borderline prolonged QT interval Since last tracing rate slower Otherwise no significant change Confirmed by KNOTT MD, DANIEL [phone]) on 02/01/2016 10:17:46 AM  Imaging: Ct Abdomen Pelvis Wo Contrast Result Date: 02/01/2016 CLINICAL DATA:  Right side abdominal pain for 4 days, diarrhea, status post ileocecal ectomy EXAM: CT  ABDOMEN AND PELVIS WITHOUT CONTRAST TECHNIQUE: Multidetector CT imaging of the abdomen and pelvis was performed following the standard protocol without IV contrast. COMPARISON:  12/28/2015 FINDINGS: Lower chest: Lung bases are normal.  Cardiomegaly is noted. Hepatobiliary: Unenhanced liver shows no biliary ductal dilatation. Gallbladder is contracted without evidence of calcified gallstones. Trace perihepatic ascites Pancreas: Unenhanced pancreas appears normal. Spleen: Borderline splenomegaly. The spleen measures 12.3 cm in length. Adrenals/Urinary Tract: No adrenal gland mass. Unenhanced kidneys are symmetrical in size. No nephrolithiasis. No hydronephrosis or hydroureter. The urinary bladder is under distended. Stomach/Bowel: No small bowel obstruction. There are postsurgical changes with sutures post ileocecal resection in right lower quadrant. No definite colonic obstruction is noted on this unenhanced scan. Some liquid stool/ air-fluid level noted within transverse colon. Diarrhea cannot be excluded. Vascular/Lymphatic: No aortic aneurysm. No definite mesenteric adenopathy is noted on this unenhanced scan. There is a right inguinal lymph node measures 1.5 cm. Left inguinal lymph node measures 1.4 cm. This might be reactive. Clinical correlation is necessary Reproductive: Prostate gland is unremarkable. Other: There is diffuse mesenteric edema. Mild anasarca infiltration subcutaneous fat abdominal and pelvic wall. No free abdominal air. Postsurgical changes scarring midline anterior abdominal wall. Musculoskeletal: No destructive bony lesions are noted.  Sagittal images of the spine are unremarkable. IMPRESSION: 1. Cardiomegaly is noted. 2. Borderline splenomegaly. 3. Postsurgical changes are noted post ileocecal resection in right lower quadrant. No small bowel or colonic obstruction. Some liquids stool air-fluid level noted within transverse colon. Diarrhea cannot be excluded. 4. Diffuse mild mesenteric edema/infiltration is noted. There is anasarca mild infiltration subcutaneous fat abdominal and pelvic wall. 5. There are enlarged bilateral inguinal lymph nodes. Although this might be reactive lymphoproliferative disease cannot be excluded. Clinical correlation is necessary. Electronically Signed   By: Lahoma Crocker M.D.   On: 02/01/2016 10:33   Assessment & Plan by Problem: Principal Problem:   Symptomatic anemia Active Problems:   Protein-calorie malnutrition, severe (HCC)   Human immunodeficiency virus (HIV) disease ([place])   ESRD on dialysis ([place])   Seizure disorder [facility])  [name] is a 35 y.o. male with AIDS, ESRD on HD (TTS), s/p ileocecectomy who presents for RLQ abd pain and diarrhea found to be anemic.  1) Anemia: Hemoglobin down trended from 8.1 to 6.7 in 3 weeks. Patient denies any hematemesis, or hematochezia/melena. S/p 1 unit packed RBCs in ED. May be 2/2 AOCD w/ ESRD, concern for hemolysis on dapsone. LFTs wnl w/o hyperbili. - Trend Hgb q12h - check PT/aPTT - check LDH, haptoglobin, D-dimer for hemolysis - check ferritin  2) Pancytopenia: Leukocytopenic, thrombocytopenic, and anemic from 3 wks. Possibly 2/2 AIDS, also concerned about aplastic anemia though not severe enough. Dapsone could cause depression of multiple cell lines. - CBC in AM  3) AIDS: CD4 10 / VL 500,000 on 01/03/16. Non-compliant w/ HART. Reportedly compliant w/ Dapsone and Azithro ppx. Will c/s ID, appreciate assistance. - repeat CD4 / VL - restart HART - continue dapsone and azithro ppx  4) ESRD: HD TTS. Electrolytes wnl. Nephrology c/s. - BMP in  AM  5) Siezure d/o: No h/o recent siezure. Continue home [place].  6) HTN: BP stable. Continue losartan, hydralizine, carvedilol home meds.  DVT PPx - low molecular weight heparin  Code Status - FULL  Consults Placed - ID / Nephrology  Dispo: Admit patient to observation.  Signed: Holley Raring, MD 02/01/2016, 4:57 PM  Pager: [phone]

## 2016-02-01 NOTE — Progress Notes (Addendum)
Patient refused to be assessed and would ask why do we ask the same questions over and over.

## 2016-02-01 NOTE — ED Triage Notes (Signed)
Pt here with right sided abd pain; pt with abd sx on 10/31 with wound still noted; dressing clean at present; pt sts last BM was today and loose

## 2016-02-01 NOTE — ED Notes (Signed)
Patient transported to CT 

## 2016-02-02 DIAGNOSIS — Z881 Allergy status to other antibiotic agents status: Secondary | ICD-10-CM

## 2016-02-02 DIAGNOSIS — R197 Diarrhea, unspecified: Secondary | ICD-10-CM | POA: Diagnosis not present

## 2016-02-02 DIAGNOSIS — D696 Thrombocytopenia, unspecified: Secondary | ICD-10-CM

## 2016-02-02 DIAGNOSIS — B20 Human immunodeficiency virus [HIV] disease: Secondary | ICD-10-CM

## 2016-02-02 DIAGNOSIS — G9349 Other encephalopathy: Secondary | ICD-10-CM | POA: Diagnosis not present

## 2016-02-02 DIAGNOSIS — D61818 Other pancytopenia: Secondary | ICD-10-CM | POA: Diagnosis not present

## 2016-02-02 DIAGNOSIS — Z888 Allergy status to other drugs, medicaments and biological substances status: Secondary | ICD-10-CM

## 2016-02-02 DIAGNOSIS — Z79899 Other long term (current) drug therapy: Secondary | ICD-10-CM

## 2016-02-02 DIAGNOSIS — Z8249 Family history of ischemic heart disease and other diseases of the circulatory system: Secondary | ICD-10-CM

## 2016-02-02 DIAGNOSIS — D649 Anemia, unspecified: Secondary | ICD-10-CM | POA: Diagnosis not present

## 2016-02-02 DIAGNOSIS — Z841 Family history of disorders of kidney and ureter: Secondary | ICD-10-CM

## 2016-02-02 DIAGNOSIS — N186 End stage renal disease: Secondary | ICD-10-CM | POA: Diagnosis not present

## 2016-02-02 DIAGNOSIS — Z9049 Acquired absence of other specified parts of digestive tract: Secondary | ICD-10-CM

## 2016-02-02 DIAGNOSIS — Z992 Dependence on renal dialysis: Secondary | ICD-10-CM

## 2016-02-02 DIAGNOSIS — D72819 Decreased white blood cell count, unspecified: Secondary | ICD-10-CM

## 2016-02-02 DIAGNOSIS — Z9114 Patient's other noncompliance with medication regimen: Secondary | ICD-10-CM

## 2016-02-02 DIAGNOSIS — G40909 Epilepsy, unspecified, not intractable, without status epilepticus: Secondary | ICD-10-CM

## 2016-02-02 DIAGNOSIS — Z833 Family history of diabetes mellitus: Secondary | ICD-10-CM

## 2016-02-02 DIAGNOSIS — Z9889 Other specified postprocedural states: Secondary | ICD-10-CM

## 2016-02-02 DIAGNOSIS — I517 Cardiomegaly: Secondary | ICD-10-CM

## 2016-02-02 LAB — CBC
HCT: 24.8 % — ABNORMAL LOW (ref 39.0–52.0)
Hemoglobin: 7.9 g/dL — ABNORMAL LOW (ref 13.0–17.0)
MCH: 28.3 pg (ref 26.0–34.0)
MCHC: 31.9 g/dL (ref 30.0–36.0)
MCV: 88.9 fL (ref 78.0–100.0)
Platelets: 101 10*3/uL — ABNORMAL LOW (ref 150–400)
RBC: 2.79 MIL/uL — ABNORMAL LOW (ref 4.22–5.81)
RDW: 17.3 % — ABNORMAL HIGH (ref 11.5–15.5)
WBC: 4.1 10*3/uL (ref 4.0–10.5)

## 2016-02-02 LAB — HIV-1 RNA QUANT-NO REFLEX-BLD
HIV 1 RNA Quant: 455000 copies/mL
LOG10 HIV-1 RNA: 5.658 log10copy/mL

## 2016-02-02 LAB — TYPE AND SCREEN
Blood Product Expiration Date: 201712192359
ISSUE DATE / TIME: 201712051415
Unit Type and Rh: 5100

## 2016-02-02 LAB — BASIC METABOLIC PANEL
Anion gap: 14 (ref 5–15)
BUN: 72 mg/dL — ABNORMAL HIGH (ref 6–20)
CO2: 24 mmol/L (ref 22–32)
Calcium: 7.8 mg/dL — ABNORMAL LOW (ref 8.9–10.3)
Chloride: 96 mmol/L — ABNORMAL LOW (ref 101–111)
Creatinine, Ser: 15.54 mg/dL — ABNORMAL HIGH (ref 0.61–1.24)
GFR calc Af Amer: 4 mL/min — ABNORMAL LOW (ref >60)
GFR calc non Af Amer: 4 mL/min — ABNORMAL LOW (ref >60)
Glucose, Bld: 103 mg/dL — ABNORMAL HIGH (ref 65–99)
Potassium: 5.1 mmol/L (ref 3.5–5.1)
Sodium: 134 mmol/L — ABNORMAL LOW (ref 135–145)

## 2016-02-02 LAB — SAVE SMEAR

## 2016-02-02 LAB — HAPTOGLOBIN: Haptoglobin: 17 mg/dL — ABNORMAL LOW (ref 34–200)

## 2016-02-02 NOTE — Progress Notes (Signed)
Went to see patient to access prior to having HD. Patient missed treatment yesterday, assumption was that he would have HD today while in hospital.. Patient said he was going home, that he would have hemodialysis at his center tomorrow. Asked again for clarification if he was refusing dialysis and he said, "yes!".    Juanell Fairly, [facility] [phone] (pager)  Patient seen and examined, agree with above note with above modifications.  Corliss Parish, MD 02/02/2016

## 2016-02-02 NOTE — Discharge Summary (Signed)
Name: Jim Wyatt MRN: [id number] DOB: 09-29-1980 35 y.o. PCP: [name], MD  Date of Admission: 02/01/2016  7:22 AM Date of Discharge: 02/02/2016 Attending Physician: Dr. Joni Reining, MD  Discharge Diagnosis: Principal Problem:   Symptomatic anemia Active Problems:   Protein-calorie malnutrition, severe ([place])   Human immunodeficiency virus (HIV) disease ([place])   ESRD on dialysis ([place])   Seizure disorder ([place])  Discharge Medications:   Medication List    TAKE these medications   abacavir 300 MG tablet Commonly known as:  ZIAGEN Take 2 tablets (600 mg total) by mouth daily with supper.   acetaminophen 500 MG tablet Commonly known as:  TYLENOL Take 1,000 mg by mouth every 6 (six) hours as needed (pain).   albuterol 108 (90 Base) MCG/ACT inhaler Commonly known as:  PROVENTIL HFA;VENTOLIN HFA Inhale 2 puffs into the lungs every 6 (six) hours as needed for wheezing or shortness of breath.   azithromycin 600 MG tablet Commonly known as:  ZITHROMAX Take 2 tablets (1,200 mg total) by mouth once a week. On Mondays   CARTIA XT 180 MG 24 hr capsule Generic drug:  diltiazem Take 180 mg by mouth daily.   carvedilol 25 MG tablet Commonly known as:  COREG Take 25 mg by mouth 2 (two) times daily.   Cyanocobalamin 2500 MCG Chew Chew 5,000 mcg by mouth daily.   dapsone 100 MG tablet Take 1 tablet (100 mg total) by mouth daily.   darunavir-cobicistat 800-150 MG tablet Commonly known as:  PREZCOBIX Take 1 tablet by mouth every evening. Swallow whole. Do NOT crush, break or chew tablets. Take with food.   dolutegravir 50 MG tablet Commonly known as:  TIVICAY Take 1 tablet (50 mg total) by mouth every evening.   hydrALAZINE 100 MG tablet Commonly known as:  APRESOLINE Take 0.5 tablets (50 mg total) by mouth 2 (two) times daily. 6:30am, 2:30pm, 11pm - for high blood pressure What changed:  how much to take  when to take this  additional instructions     HYDROcodone-acetaminophen 5-325 MG tablet Commonly known as:  NORCO/VICODIN Take 1-2 tablets by mouth every 6 (six) hours as needed for moderate pain or severe pain.   levETIRAcetam 500 MG tablet Commonly known as:  KEPPRA Take 500 mg by mouth See admin instructions. Take 1 tablet (500 mg) by mouth Sunday, Monday, Wednesday, Friday at 7pm (non-dialysis days) What changed:  Another medication with the same name was changed. Make sure you understand how and when to take each.   levETIRAcetam 250 MG tablet Commonly known as:  KEPPRA Take 1 tablet (250 mg total) by mouth See admin instructions. Take 1 tablet after your dialysis treatments. What changed:  additional instructions   losartan 100 MG tablet Commonly known as:  COZAAR Take 100 mg by mouth at bedtime.   ondansetron 8 MG disintegrating tablet Commonly known as:  ZOFRAN ODT Take 1 tablet (8 mg total) by mouth every 8 (eight) hours as needed for nausea or vomiting.   VELPHORO 500 MG chewable tablet Generic drug:  sucroferric oxyhydroxide Chew 500 mg by mouth See admin instructions. Chew 1 tablet (500 mg) by mouth after every meal (2-3 times daily)   Vitamin C 250 MG Chew Chew 250 mg by mouth daily.       Disposition and follow-up:   [name] was discharged from [facility] in Good condition.  At the hospital follow up visit please address:  1.  Symptomatic Anemia:  check Hgb and consider f/u FOBT. Improvement in fatique and malaise?  RLQ abd pain / diarrhea: Recurrence of symptoms?  HIV AIDS: Compliance w/ HART? Would be helpful to reassess pt's barriers to compliance.  Pancytopenia: f/u CBC and consider possible causes of multi-cell line suppression in light of HIV, risk of bone marrow infection, and polypharmacy which may contribute to pancytopenia.  2.  Labs / imaging needed at time of follow-up: CBC, FOBT  3.  Pending labs/ test needing follow-up: CD4 / VL  Follow-up  Appointments: Follow-up Information    [facility]             . Schedule an appointment as soon as possible for a visit.   Contact information: [address] [place] [phone]       [name], MD. Schedule an appointment as soon as possible for a visit.   Specialty:  Internal Medicine Contact information: [place] [address] [phone]          Hospital Course by problem list: Principal Problem:   Symptomatic anemia Active Problems:   Protein-calorie malnutrition, severe (HCC)   Human immunodeficiency virus (HIV) disease ([place])   ESRD on dialysis ([place])   Seizure disorder ([place])   Symptomatic Anemia: Pt presented with complaint of worsening fatigue and malaise x 4 days. He was found to have a Hgb of 6.7 in the ED and received 1 unit of pRBC with an appropriate response Hgb to 8.3, then 7.9 on the day of discharge. He was not complaining of gross GI bleeding, but likely source from recent ileocecectomy 3 weeks ago. No s/s of hemolysis.  RLQ abd pain / diarrhea: Pt complained of 4 days of RLQ pain and 1 day of watery diarrhea. He reports spontaneous resolution of his symptoms on HD 1. Pt was afebrile and w/ self-limited course, ID has low suspicion for AIDS related infection. We recommend outpt follow up and return to for evaluation if symptoms recurr.  HIV AIDS: Pt reports non-compliance with HART therapy, but does endorse compliance w/ ppx medications dapsone and azithromycin. ID was consulted and recommended continuing current therapy with follow in their outpatient clinic.  Pancytopenia: Pt initially with multi-cell line depression w/ leukopenia, anemia, and thrombocytopenia compared with prior labs 2 weeks before this admission. Post-transfusion labs the next morning represent moderate improvement which appear to be consistent with the patient's baseline. ID has low suspicion for marrow  suppression at this time and recommend continuing dapsone ppx.  Discharge Vitals:   BP (!) 143/90 (BP Location: Right Arm)   Pulse 86   Temp 98.4 F (36.9 C) (Oral)   Resp 18   SpO2 92%   Pertinent Labs, Studies, and Procedures: As above.  Procedures Performed:  Ct Abdomen Pelvis Wo Contrast IMPRESSION: 1. Cardiomegaly is noted. 2. Borderline splenomegaly. 3. Postsurgical changes are noted post ileocecal resection in right lower quadrant. No small bowel or colonic obstruction. Some liquids stool air-fluid level noted within transverse colon. Diarrhea cannot be excluded. 4. Diffuse mild mesenteric edema/infiltration is noted. There is anasarca mild infiltration subcutaneous fat abdominal and pelvic wall. 5. There are enlarged bilateral inguinal lymph nodes. Although this might be reactive lymphoproliferative disease cannot be excluded. Clinical correlation is necessary.   Consultations: Treatment Team:  Corliss Parish, MD  Lita Mains, MD  Discharge Instructions: Discharge Instructions    Call MD for:  persistant dizziness or light-headedness    Complete by:  As  directed    Call MD for:  persistant nausea and vomiting    Complete by:  As directed    Call MD for:  temperature >100.4    Complete by:  As directed    Diet - low sodium heart healthy    Complete by:  As directed    Discharge instructions    Complete by:  As directed    Please follow up in the Infectious Disease clinic.   Increase activity slowly    Complete by:  As directed      Signed: Holley Raring, MD 02/02/2016, 1:35 PM   Pager: [phone]

## 2016-02-02 NOTE — Progress Notes (Signed)
Patient would not allow me to swab his nose for mrsa screen. Will continue to keep him on contact during this stay.

## 2016-02-02 NOTE — Progress Notes (Signed)
Subjective: Currently, the patient is feeling comfortable. He denies any abd pain this morning. Reports no further episodes of diarrhea. No complaints. States "I'm going to be discharged today".  Interval Events: Appropriate Hgb response post-transfusion.  Objective: Vital signs in last 24 hours: Vitals:   02/01/16 2142 02/01/16 2145 02/01/16 2230 02/02/16 0600  BP: (!) 163/112 (!) 163/112 (!) 156/94 (!) 143/90  Pulse: 88   86  Resp: 18   18  Temp: 98.8 F (37.1 C)   98.4 F (36.9 C)  TempSrc: Oral   Oral  SpO2: 92%   92%   Intake/Output:  12/05 0701 - 12/06 0700 In: 340 [Blood:340] Out: -     Physical Exam: Physical Exam  Constitutional: No distress.  Eyes: No scleral icterus.  Cardiovascular: Normal rate, regular rhythm and normal heart sounds.   Pulmonary/Chest: Effort normal and breath sounds normal.  Abdominal: Soft. Bowel sounds are normal. He exhibits no distension. There is no tenderness. There is no guarding.  Ventral scar from bowel resection  Musculoskeletal: He exhibits no edema.   Labs: CBC:  Recent Labs Lab 02/01/16 0821 02/01/16 1836 02/02/16 0648  WBC 3.9* 2.7* 4.1  NEUTROABS 2.6  --   --   HGB 6.7* 8.3* 7.9*  HCT 20.8* 25.7* 24.8*  MCV 90.8 89.5 88.9  PLT 97* 462* 703*   Metabolic Panel:  Recent Labs Lab 02/01/16 0821 02/01/16 1836 02/02/16 0648  NA 133*  --  134*  K 4.5  --  5.1  CL 93*  --  96*  CO2 28  --  24  GLUCOSE 98  --  103*  BUN 61*  --  72*  CREATININE 14.07*  --  15.54*  CALCIUM 7.6*  --  7.8*  ALT 11*  --   --   ALKPHOS 33*  --   --   BILITOT 0.6  --   --   PROT 8.0  --   --   ALBUMIN 2.2*  --   --   LIPASE 52*  --   --   LABPROT  --  13.0  --   INR  --  0.98  --     Medications: Scheduled Medications: . sodium chloride  10 mL/hr Intravenous Once  . abacavir  600 mg Oral Q supper  . carvedilol  25 mg Oral BID  . dapsone  100 mg Oral Daily  . darunavir-cobicistat  1 tablet Oral QPM  . diltiazem  180 mg  Oral Daily  . dolutegravir  50 mg Oral QPM  . hydrALAZINE  100 mg Oral TID  . [START ON 02/03/2016] levETIRAcetam  250 mg Oral Q T,Th,Sa-HD  . levETIRAcetam  500 mg Oral Q M,W,F,Sa-1800  . losartan  100 mg Oral QHS  . vitamin B-12  5,000 mcg Oral Daily   PRN Medications: albuterol, ondansetron  Assessment/Plan: Jim Wyatt is a 35 y.o. male with AIDS, ESRD on HD (TTS), s/p ileocecectomy who presents for RLQ abd pain and diarrhea found to be anemic.  1) RLQ Pain and Diarrhea: resolved today per pt. ID w/ low suspicion for AIDS related infection. Recommend f/u in ID clinic, cont azithro MAC ppx.  2) Anemia: Hemoglobin down trended from 8.1 to 6.7 in 3 weeks. Patient denies any hematemesis, or hematochezia/melena. S/p 1 unit packed RBCs in ED. May be 2/2 AOCD w/ ESRD, concern for hemolysis on dapsone. LFTs wnl w/o hyperbili. - Trend Hgb q12h - check PT/aPTT - check LDH, haptoglobin, D-dimer  for hemolysis - check ferritin  3) Pancytopenia: Leukocytopenic, thrombocytopenic, and anemic from 3 wks. Possibly 2/2 AIDS, also concerned about aplastic anemia though not severe enough. Dapsone could cause depression of multiple cell lines. - CBC in AM  4) AIDS: CD4 10 / VL 500,000 on 01/03/16. Non-compliant w/ HART. Reportedly compliant w/ Dapsone and Azithro ppx. ID c/s, appreciate assistance. - repeat CD4 / VL - restart HART - continue dapsone and azithro ppx  5) ESRD: HD TTS. Electrolytes wnl. Nephrology c/s. 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 Continue home [place].  7) HTN: BP stable. Continue losartan, hydralizine, carvedilol home meds.  Length of Stay: 0 day(s) Dispo: Anticipated discharge in approximately 1 day(s).  Holley Raring, MD Pager: [phone] (7AM-5PM) 02/02/2016, 9:14 AM

## 2016-02-02 NOTE — Consult Note (Signed)
[facility] for Infectious Disease  Date of Admission:  02/01/2016  Date of Consult:  02/02/2016  Reason for Consult: AIDS associated diarrhea Referring Physician: Dr. Barton Dubois.   Impression/Recommendation  AIDS- last CD4 count 10 on 11/6. Repeat pending.   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    Chronic thrombocytopenia - plt has been low in the past and now is in low 100's.  Leukopenia - stable at 4. His b/l is around 4-6. Likely from AIDS.  Cardiomegaly seen on CXR, no ECHO in chart, may consider getting one.   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   Seizure disorder    Unclear etiology of RLQ abd pain and one day of watery diarrhea. CT abdomen does not show any major abnormal changes. However, patient is feeling well today with no further diarrhea. Differential for AIDS related diarrhea is very broad but given rapid improvement I don't suspect of any active infection currently in addition to his HIV AIDS.  His pancytopenia was transient, WBC drop from already low baseline could be due to pRBC transfusion but now it's stable around his baseline. His anemia could be from on going GI bleed. Could repeat FOBT. plt cound has fluctuating in the past which is a acute phase reactant, but now seems to be stable around 100. I don't suspect any significant bone marrow suppression. I would recommend continuing his ART with dapsone. Not sure why he is not taking azithromycin, he definite needs that for MAC ppx.   He may be discharged home today with f/up with Dr. Baxter Flattery outpatient.   Thank you so much for this interesting consult,   Ahmed, Tasrif (pager) [phone] [email or web address]  Jim Wyatt is an 35 y.o. male.  HPI:   35 yo male with AIDS, ESRD, seizures, HTN among other medical hx presented with RLQ abd pain, diarrhea, and fatigue. Recently discharged on 01/01/16 when he was admitted  for hematochezia, had recent ileocecectomy for intussusceptions. He was doing well but 4 days PTA he started having RLQ abd pain which was gradually worsening and the day of admission he started having watery diarrhea.    His hgb was 6.7 on admission down from 8.1 on discharge, FOBT +, received transfusion. CT abdomen pelvis showed diffuse mild mesenteric edema. No bowel wall edema or obstruction. Enlarged b/l inguinal lymph nodes.   On abacavir + darunavir-cobicistat + dolutegravir for HIV AIDS and on dapsone ppx, not on azithromycin for ppx. States noncompliance with ART but has been taking dapsone.   Patient is refusing to give me a detailed history. Repetitively saying that everything is in the chart and that he does not like to repeat himself. States his abdominal pain has resolved completely and his diarrhea was only yesterday. Did not qualify or quantify the diarrhea further. He states he feels fine right now and is ready to get out of the hospital.   Past Medical History:  Diagnosis Date  . Asthma   . CAP (community acquired pneumonia) 10/2013  . Cellulitis 11/2013   LLE  . DVT (deep venous thrombosis) ([place]) 10/2013   BLE  . 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 Went on HD Dec 2016.   [name] Fever   . Headache   . HIV infection ([place]) dx'd 10/2013  . Hypertension   . Leg pain   .  Multiple environmental allergies    "trees, dogs, cats"  . Multiple food allergies   . Shingles < 2010  . 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 AV FISTULA PLACEMENT Left 03/03/2015   Procedure: Creation of Left Arm RADIOCEPHALIC ARTERIOVENOUS FISTULA ;  Surgeon: Mal Misty, MD;  Location: [facility];  Service: Vascular;  Laterality: Left;  . FEMUR FRACTURE SURGERY Right 09/2001  . FRACTURE SURGERY    . I&D EXTREMITY Right 12/18/2014   Procedure: IRRIGATION AND DEBRIDEMENT RIGHT LOWER EXTREMITY;   Surgeon: Ralene Ok, MD;  Location: [facility];  Service: General;  Laterality: Right;  . INSERTION OF DIALYSIS CATHETER  2016  . LAPAROTOMY N/A 12/28/2015   Procedure: EXPLORATORY LAPAROTOMY BOWEL RESECTION ILEOCECECTOMY;  Surgeon: Clovis Riley, MD;  Location: [facility];  Service: General;  Laterality: N/A;     Allergies  Allergen Reactions  . Bactrim [Sulfamethoxazole-Trimethoprim] Other (See Comments)    Renal failure  . Vancomycin Other (See Comments)    Patient states vancomycin caused kidney injury  . Reglan [Metoclopramide] Other (See Comments)    Causes ticks/ jerks  . Amlodipine Palpitations and Other (See Comments)    fatigue  . Augmentin [Amoxicillin-Pot Clavulanate] Hives    Medications: I have reviewed the patient's current medications.  Abtx:  Anti-infectives    Start     Dose/Rate Route Frequency Ordered Stop   02/02/16 1000  dapsone tablet 100 mg     100 mg Oral Daily 02/01/16 1819     02/01/16 1930  darunavir-cobicistat (PREZCOBIX) 800-150 MG per tablet 1 tablet     1 tablet Oral Every evening 02/01/16 1819     02/01/16 1900  abacavir (ZIAGEN) tablet 600 mg     600 mg Oral Daily with supper 02/01/16 1819     02/01/16 1900  dolutegravir (TIVICAY) tablet 50 mg     50 mg Oral Every evening 02/01/16 1819        Total days of antibiotics: none          Social History:  reports that he has never smoked. He has never used smokeless tobacco. He reports that he does not drink alcohol or use drugs.  Family History  Problem Relation Age of Onset  . Hypertension Mother   . Diabetes Maternal Grandmother   . Kidney disease Maternal Grandmother     ROS was limited due to his refusal to give history.  Blood pressure (!) 143/90, pulse 86, temperature 98.4 F (36.9 C), temperature source Oral, resp. rate 18, SpO2 92 %. Physical Exam  Constitutional: No distress.  HENT:  Mouth/Throat: No oropharyngeal exudate.  Eyes: EOM are normal. Pupils are equal, round, and  reactive to light.  Neck: Neck supple.  Cardiovascular: Normal rate, regular rhythm and normal heart sounds.   Pulmonary/Chest: Effort normal and breath sounds normal.  Abdominal: Soft. Bowel sounds are normal. There is no tenderness. There is no rebound.  Well healed, peri-umbilical wound   Musculoskeletal: He exhibits no edema.    Patient refused to let me examine him.   Results for orders placed or performed during the hospital encounter of 02/01/16 (from the past 48 hour(s))  Comprehensive metabolic panel     Status: Abnormal   Collection Time: 02/01/16  8:21 AM  Result Value Ref Range   Sodium 133 (L) 135 - 145 mmol/L   Potassium 4.5 3.5 - 5.1 mmol/L   Chloride 93 (L) 101 - 111 mmol/L  CO2 28 22 - 32 mmol/L   Glucose, Bld 98 65 - 99 mg/dL   BUN 61 (H) 6 - 20 mg/dL   Creatinine, Ser 14.07 (H) 0.61 - 1.24 mg/dL   Calcium 7.6 (L) 8.9 - 10.3 mg/dL   Total Protein 8.0 6.5 - 8.1 g/dL   Albumin 2.2 (L) 3.5 - 5.0 g/dL   AST 40 15 - 41 U/L   ALT 11 (L) 17 - 63 U/L   Alkaline Phosphatase 33 (L) 38 - 126 U/L   Total Bilirubin 0.6 0.3 - 1.2 mg/dL   GFR calc non Af Amer 4 (L) >60 mL/min   GFR calc Af Amer 5 (L) >60 mL/min    Comment: (NOTE) The eGFR has been calculated using the CKD EPI equation. This calculation has not been validated in all clinical situations. eGFR's persistently <60 mL/min signify possible Chronic Kidney Disease.    Anion gap 12 5 - 15  CBC with Differential     Status: Abnormal   Collection Time: 02/01/16  8:21 AM  Result Value Ref Range   WBC 3.9 (L) 4.0 - 10.5 K/uL   RBC 2.29 (L) 4.22 - 5.81 MIL/uL   Hemoglobin 6.7 (LL) 13.0 - 17.0 g/dL    Comment: REPEATED TO VERIFY K.KING, RN 02/01/16 '@0925'  BY [place],N CRITICAL RESULT CALLED TO, READ BACK BY AND VERIFIED WITH:    HCT 20.8 (L) 39.0 - 52.0 %   MCV 90.8 78.0 - 100.0 fL   MCH 29.3 26.0 - 34.0 pg   MCHC 32.2 30.0 - 36.0 g/dL   RDW 17.6 (H) 11.5 - 15.5 %   Platelets 97 (L) 150 - 400 K/uL     Comment: SPECIMEN CHECKED FOR CLOTS PLATELET COUNT CONFIRMED BY SMEAR    Neutrophils Relative % 66 %   Neutro Abs 2.6 1.7 - 7.7 K/uL   Lymphocytes Relative 21 %   Lymphs Abs 0.8 0.7 - 4.0 K/uL   Monocytes Relative 12 %   Monocytes Absolute 0.5 0.1 - 1.0 K/uL   Eosinophils Relative 0 %   Eosinophils Absolute 0.0 0.0 - 0.7 K/uL   Basophils Relative 1 %   Basophils Absolute 0.0 0.0 - 0.1 K/uL  Lactic acid, plasma     Status: None   Collection Time: 02/01/16  8:21 AM  Result Value Ref Range   Lactic Acid, Venous 0.6 0.5 - 1.9 mmol/L  Lipase, blood     Status: Abnormal   Collection Time: 02/01/16  8:21 AM  Result Value Ref Range   Lipase 52 (H) 11 - 51 U/L  Lactic acid, plasma     Status: Abnormal   Collection Time: 02/01/16 11:46 AM  Result Value Ref Range   Lactic Acid, Venous 0.4 (L) 0.5 - 1.9 mmol/L  Prepare RBC     Status: None   Collection Time: 02/01/16 12:04 PM  Result Value Ref Range   Order Confirmation ORDER PROCESSED BY BLOOD BANK   Type and screen [place]     Status: None   Collection Time: 02/01/16 12:11 PM  Result Value Ref Range   ISSUE DATE / TIME [id number]    Blood Product Unit Number [id number]    PRODUCT CODE [id number]    Unit Type and Rh 5100    Blood Product Expiration Date [id number]   CBC     Status: Abnormal   Collection Time: 02/01/16  6:36 PM  Result Value Ref Range   WBC 2.7 (L) 4.0 - 10.5 K/uL  RBC 2.87 (L) 4.22 - 5.81 MIL/uL   Hemoglobin 8.3 (L) 13.0 - 17.0 g/dL    Comment: POST TRANSFUSION SPECIMEN   HCT 25.7 (L) 39.0 - 52.0 %   MCV 89.5 78.0 - 100.0 fL   MCH 28.9 26.0 - 34.0 pg   MCHC 32.3 30.0 - 36.0 g/dL   RDW 17.3 (H) 11.5 - 15.5 %   Platelets 115 (L) 150 - 400 K/uL    Comment: CONSISTENT WITH PREVIOUS RESULT  Lactate dehydrogenase     Status: Abnormal   Collection Time: 02/01/16  6:36 PM  Result Value Ref Range   LDH 356 (H) 98 - 192 U/L  Haptoglobin     Status: Abnormal   Collection Time: 02/01/16   6:36 PM  Result Value Ref Range   Haptoglobin 17 (L) 34 - 200 mg/dL    Comment: (NOTE) Performed At: [facility] [address] [place], [place] [id number] Lindon Romp MD EL:[id number]   D-dimer, quantitative (not at [facility])     Status: Abnormal   Collection Time: 02/01/16  6:36 PM  Result Value Ref Range   D-Dimer, Quant 2.15 (H) 0.00 - 0.50 ug/mL-FEU    Comment: (NOTE) At the manufacturer cut-off of 0.50 ug/mL FEU, this assay has been documented to exclude PE with a sensitivity and negative predictive value of 97 to 99%.  At this time, this assay has not been approved by the FDA to exclude DVT/VTE. Results should be correlated with clinical presentation.   Ferritin     Status: Abnormal   Collection Time: 02/01/16  6:36 PM  Result Value Ref Range   Ferritin 1,050 (H) 24 - 336 ng/mL  APTT     Status: Abnormal   Collection Time: 02/01/16  6:36 PM  Result Value Ref Range   aPTT 46 (H) 24 - 36 seconds    Comment:        IF BASELINE aPTT IS ELEVATED, SUGGEST PATIENT RISK ASSESSMENT BE USED TO DETERMINE APPROPRIATE ANTICOAGULANT THERAPY.   Protime-INR     Status: None   Collection Time: 02/01/16  6:36 PM  Result Value Ref Range   Prothrombin Time 13.0 11.4 - 15.2 seconds   INR 0.98   CBC     Status: Abnormal   Collection Time: 02/02/16  6:48 AM  Result Value Ref Range   WBC 4.1 4.0 - 10.5 K/uL   RBC 2.79 (L) 4.22 - 5.81 MIL/uL   Hemoglobin 7.9 (L) 13.0 - 17.0 g/dL   HCT 24.8 (L) 39.0 - 52.0 %   MCV 88.9 78.0 - 100.0 fL   MCH 28.3 26.0 - 34.0 pg   MCHC 31.9 30.0 - 36.0 g/dL   RDW 17.3 (H) 11.5 - 15.5 %   Platelets 101 (L) 150 - 400 K/uL    Comment: CONSISTENT WITH PREVIOUS RESULT  Basic metabolic panel     Status: Abnormal   Collection Time: 02/02/16  6:48 AM  Result Value Ref Range   Sodium 134 (L) 135 - 145 mmol/L   Potassium 5.1 3.5 - 5.1 mmol/L   Chloride 96 (L) 101 - 111 mmol/L   CO2 24 22 - 32 mmol/L   Glucose, Bld 103 (H) 65 - 99 mg/dL   BUN 72  (H) 6 - 20 mg/dL   Creatinine, Ser 15.54 (H) 0.61 - 1.24 mg/dL   Calcium 7.8 (L) 8.9 - 10.3 mg/dL   GFR calc non Af Amer 4 (L) >60 mL/min   GFR calc Af Amer 4 (  L) >60 mL/min    Comment: (NOTE) The eGFR has been calculated using the CKD EPI equation. This calculation has not been validated in all clinical situations. eGFR's persistently <60 mL/min signify possible Chronic Kidney Disease.    Anion gap 14 5 - 15      Component Value Date/Time   SDES EAR RIGHT 11/23/2015 1638   SPECREQUEST NONE 11/23/2015 1638   CULT  11/23/2015 1638    RARE METHICILLIN RESISTANT STAPHYLOCOCCUS AUREUS RARE STAPHYLOCOCCUS LUGDUNENSIS    REPTSTATUS 11/27/2015 FINAL 11/23/2015 1638   Ct Abdomen Pelvis Wo Contrast  Result Date: 02/01/2016 CLINICAL DATA:  Right side abdominal pain for 4 days, diarrhea, status post ileocecal ectomy EXAM: CT ABDOMEN AND PELVIS WITHOUT CONTRAST TECHNIQUE: Multidetector CT imaging of the abdomen and pelvis was performed following the standard protocol without IV contrast. COMPARISON:  12/28/2015 FINDINGS: Lower chest: Lung bases are normal.  Cardiomegaly is noted. Hepatobiliary: Unenhanced liver shows no biliary ductal dilatation. Gallbladder is contracted without evidence of calcified gallstones. Trace perihepatic ascites Pancreas: Unenhanced pancreas appears normal. Spleen: Borderline splenomegaly. The spleen measures 12.3 cm in length. Adrenals/Urinary Tract: No adrenal gland mass. Unenhanced kidneys are symmetrical in size. No nephrolithiasis. No hydronephrosis or hydroureter. The urinary bladder is under distended. Stomach/Bowel: No small bowel obstruction. There are postsurgical changes with sutures post ileocecal resection in right lower quadrant. No definite colonic obstruction is noted on this unenhanced scan. Some liquid stool/ air-fluid level noted within transverse colon. Diarrhea cannot be excluded. Vascular/Lymphatic: No aortic aneurysm. No definite mesenteric adenopathy is  noted on this unenhanced scan. There is a right inguinal lymph node measures 1.5 cm. Left inguinal lymph node measures 1.4 cm. This might be reactive. Clinical correlation is necessary Reproductive: Prostate gland is unremarkable. Other: There is diffuse mesenteric edema. Mild anasarca infiltration subcutaneous fat abdominal and pelvic wall. No free abdominal air. Postsurgical changes scarring midline anterior abdominal wall. Musculoskeletal: No destructive bony lesions are noted. Sagittal images of the spine are unremarkable. IMPRESSION: 1. Cardiomegaly is noted. 2. Borderline splenomegaly. 3. Postsurgical changes are noted post ileocecal resection in right lower quadrant. No small bowel or colonic obstruction. Some liquids stool air-fluid level noted within transverse colon. Diarrhea cannot be excluded. 4. Diffuse mild mesenteric edema/infiltration is noted. There is anasarca mild infiltration subcutaneous fat abdominal and pelvic wall. 5. There are enlarged bilateral inguinal lymph nodes. Although this might be reactive lymphoproliferative disease cannot be excluded. Clinical correlation is necessary. Electronically Signed   By: Lahoma Crocker M.D.   On: 02/01/2016 10:33   No results found for this or any previous visit (from the past 240 hour(s)).    02/02/2016, 10:07 AM     LOS: 0 days    Records and images were personally reviewed where available.

## 2016-02-03 ENCOUNTER — Telehealth: Payer: Self-pay

## 2016-02-03 ENCOUNTER — Telehealth: Payer: Self-pay | Admitting: Neurology

## 2016-02-03 ENCOUNTER — Ambulatory Visit: Payer: Self-pay | Admitting: Internal Medicine

## 2016-02-03 DIAGNOSIS — D509 Iron deficiency anemia, unspecified: Secondary | ICD-10-CM | POA: Diagnosis not present

## 2016-02-03 DIAGNOSIS — N186 End stage renal disease: Secondary | ICD-10-CM | POA: Diagnosis not present

## 2016-02-03 DIAGNOSIS — N2581 Secondary hyperparathyroidism of renal origin: Secondary | ICD-10-CM | POA: Diagnosis not present

## 2016-02-03 DIAGNOSIS — R509 Fever, unspecified: Secondary | ICD-10-CM | POA: Diagnosis not present

## 2016-02-03 DIAGNOSIS — D631 Anemia in chronic kidney disease: Secondary | ICD-10-CM | POA: Diagnosis not present

## 2016-02-03 LAB — T-HELPER CELLS (CD4) COUNT (NOT AT ARMC)
CD4 % Helper T Cell: 4 % — ABNORMAL LOW (ref 33–55)
CD4 T Cell Abs: 20 /uL — ABNORMAL LOW (ref 400–2700)

## 2016-02-03 MED ORDER — LEVETIRACETAM 500 MG PO TABS: ORAL_TABLET | ORAL | 2 refills | Status: DC

## 2016-02-03 MED ORDER — LEVETIRACETAM 250 MG PO TABS: ORAL_TABLET | ORAL | 2 refills | Status: DC

## 2016-02-03 NOTE — Telephone Encounter (Signed)
Followed up with patient. See other phone note dated 02/03/16.

## 2016-02-03 NOTE — Telephone Encounter (Signed)
Patient called [facility] on 02/02/16 at 11:44PM . Initial Comment: Caller states he needs a prescription refill. Needs authorization. Just discharged from hospital. Additional Documentation: Wants refill on Keppra 500mg  daily. States he was in hospital for a different issue and did not realize he needed refill. Medication was prescribed by hospital doctor when he was in hospital in Sept.   Contacted patient. He needs refills on the Keppra 250mg  he takes Tuesday, Thursday, and Saturdays. He also needs refill on Keppra 500mg  he takes one tablet the rest of the week. Patient was last seen 01/12/16. Is this okay?

## 2016-02-03 NOTE — Telephone Encounter (Signed)
Ok to fill, thanks 

## 2016-02-03 NOTE — Telephone Encounter (Signed)
RX sent to pharmacy. Patient notified.

## 2016-02-03 NOTE — Telephone Encounter (Signed)
PT called and said his prescription for Keppra has not been called in to the pharmacy/Dawn CB# [phone]

## 2016-02-05 DIAGNOSIS — D631 Anemia in chronic kidney disease: Secondary | ICD-10-CM | POA: Diagnosis not present

## 2016-02-05 DIAGNOSIS — N186 End stage renal disease: Secondary | ICD-10-CM | POA: Diagnosis not present

## 2016-02-05 DIAGNOSIS — R509 Fever, unspecified: Secondary | ICD-10-CM | POA: Diagnosis not present

## 2016-02-05 DIAGNOSIS — D509 Iron deficiency anemia, unspecified: Secondary | ICD-10-CM | POA: Diagnosis not present

## 2016-02-05 DIAGNOSIS — N2581 Secondary hyperparathyroidism of renal origin: Secondary | ICD-10-CM | POA: Diagnosis not present

## 2016-02-08 DIAGNOSIS — D509 Iron deficiency anemia, unspecified: Secondary | ICD-10-CM | POA: Diagnosis not present

## 2016-02-08 DIAGNOSIS — N186 End stage renal disease: Secondary | ICD-10-CM | POA: Diagnosis not present

## 2016-02-08 DIAGNOSIS — D631 Anemia in chronic kidney disease: Secondary | ICD-10-CM | POA: Diagnosis not present

## 2016-02-08 DIAGNOSIS — N2581 Secondary hyperparathyroidism of renal origin: Secondary | ICD-10-CM | POA: Diagnosis not present

## 2016-02-08 DIAGNOSIS — R509 Fever, unspecified: Secondary | ICD-10-CM | POA: Diagnosis not present

## 2016-02-12 DIAGNOSIS — N2581 Secondary hyperparathyroidism of renal origin: Secondary | ICD-10-CM | POA: Diagnosis not present

## 2016-02-12 DIAGNOSIS — N186 End stage renal disease: Secondary | ICD-10-CM | POA: Diagnosis not present

## 2016-02-12 DIAGNOSIS — D509 Iron deficiency anemia, unspecified: Secondary | ICD-10-CM | POA: Diagnosis not present

## 2016-02-12 DIAGNOSIS — R509 Fever, unspecified: Secondary | ICD-10-CM | POA: Diagnosis not present

## 2016-02-12 DIAGNOSIS — D631 Anemia in chronic kidney disease: Secondary | ICD-10-CM | POA: Diagnosis not present

## 2016-02-15 DIAGNOSIS — D509 Iron deficiency anemia, unspecified: Secondary | ICD-10-CM | POA: Diagnosis not present

## 2016-02-15 DIAGNOSIS — D631 Anemia in chronic kidney disease: Secondary | ICD-10-CM | POA: Diagnosis not present

## 2016-02-15 DIAGNOSIS — N186 End stage renal disease: Secondary | ICD-10-CM | POA: Diagnosis not present

## 2016-02-15 DIAGNOSIS — R509 Fever, unspecified: Secondary | ICD-10-CM | POA: Diagnosis not present

## 2016-02-15 DIAGNOSIS — N2581 Secondary hyperparathyroidism of renal origin: Secondary | ICD-10-CM | POA: Diagnosis not present

## 2016-02-17 DIAGNOSIS — R509 Fever, unspecified: Secondary | ICD-10-CM | POA: Diagnosis not present

## 2016-02-17 DIAGNOSIS — D631 Anemia in chronic kidney disease: Secondary | ICD-10-CM | POA: Diagnosis not present

## 2016-02-17 DIAGNOSIS — N2581 Secondary hyperparathyroidism of renal origin: Secondary | ICD-10-CM | POA: Diagnosis not present

## 2016-02-17 DIAGNOSIS — D509 Iron deficiency anemia, unspecified: Secondary | ICD-10-CM | POA: Diagnosis not present

## 2016-02-17 DIAGNOSIS — N186 End stage renal disease: Secondary | ICD-10-CM | POA: Diagnosis not present

## 2016-02-22 DIAGNOSIS — J45909 Unspecified asthma, uncomplicated: Secondary | ICD-10-CM | POA: Diagnosis present

## 2016-02-22 DIAGNOSIS — D649 Anemia, unspecified: Secondary | ICD-10-CM | POA: Diagnosis not present

## 2016-02-22 DIAGNOSIS — Z992 Dependence on renal dialysis: Secondary | ICD-10-CM | POA: Diagnosis not present

## 2016-02-22 DIAGNOSIS — D61818 Other pancytopenia: Secondary | ICD-10-CM | POA: Diagnosis not present

## 2016-02-22 DIAGNOSIS — E8770 Fluid overload, unspecified: Secondary | ICD-10-CM | POA: Diagnosis not present

## 2016-02-22 DIAGNOSIS — D631 Anemia in chronic kidney disease: Secondary | ICD-10-CM | POA: Diagnosis not present

## 2016-02-22 DIAGNOSIS — G40909 Epilepsy, unspecified, not intractable, without status epilepticus: Secondary | ICD-10-CM | POA: Diagnosis present

## 2016-02-22 DIAGNOSIS — N186 End stage renal disease: Secondary | ICD-10-CM | POA: Diagnosis not present

## 2016-02-22 DIAGNOSIS — B20 Human immunodeficiency virus [HIV] disease: Secondary | ICD-10-CM | POA: Diagnosis present

## 2016-02-22 DIAGNOSIS — I120 Hypertensive chronic kidney disease with stage 5 chronic kidney disease or end stage renal disease: Secondary | ICD-10-CM | POA: Diagnosis not present

## 2016-02-22 DIAGNOSIS — R0602 Shortness of breath: Secondary | ICD-10-CM | POA: Diagnosis not present

## 2016-02-23 ENCOUNTER — Telehealth: Payer: Self-pay | Admitting: *Deleted

## 2016-02-23 NOTE — Telephone Encounter (Signed)
Pharmacy called to let MD know that the patient has not been filling his medication regularly.  He was due 1 month ago for refill.  Patient has been hospitalized recently, had a viral load of 455,000 on 12/5, ID consult 12/6. No follow up at [facility] scheduled. Pharmacy wanted to know if medication should be held until the patient is seen by MD.  They will continue to reach out to the patient for delivery of medication.  RN left message for patient asking him to call, stating he needed an appointment with [facility] after he was released from the hospital, letting him know the pharmacy was trying to deliver medication to him. [name], RN

## 2016-02-24 NOTE — Telephone Encounter (Signed)
Have pharmacy refill meds and schedule follow up. He has long hx of noncompliance. Only so much we can do.

## 2016-02-26 DIAGNOSIS — R509 Fever, unspecified: Secondary | ICD-10-CM | POA: Diagnosis not present

## 2016-02-26 DIAGNOSIS — N2581 Secondary hyperparathyroidism of renal origin: Secondary | ICD-10-CM | POA: Diagnosis not present

## 2016-02-26 DIAGNOSIS — D509 Iron deficiency anemia, unspecified: Secondary | ICD-10-CM | POA: Diagnosis not present

## 2016-02-26 DIAGNOSIS — N186 End stage renal disease: Secondary | ICD-10-CM | POA: Diagnosis not present

## 2016-02-26 DIAGNOSIS — D631 Anemia in chronic kidney disease: Secondary | ICD-10-CM | POA: Diagnosis not present

## 2016-02-27 DIAGNOSIS — Z992 Dependence on renal dialysis: Secondary | ICD-10-CM | POA: Diagnosis not present

## 2016-02-27 DIAGNOSIS — N049 Nephrotic syndrome with unspecified morphologic changes: Secondary | ICD-10-CM | POA: Diagnosis not present

## 2016-02-27 DIAGNOSIS — N186 End stage renal disease: Secondary | ICD-10-CM | POA: Diagnosis not present

## 2016-02-29 DIAGNOSIS — N2581 Secondary hyperparathyroidism of renal origin: Secondary | ICD-10-CM | POA: Diagnosis not present

## 2016-02-29 DIAGNOSIS — D509 Iron deficiency anemia, unspecified: Secondary | ICD-10-CM | POA: Diagnosis not present

## 2016-02-29 DIAGNOSIS — N186 End stage renal disease: Secondary | ICD-10-CM | POA: Diagnosis not present

## 2016-03-01 ENCOUNTER — Ambulatory Visit: Payer: Medicare Other | Attending: Internal Medicine | Admitting: Internal Medicine

## 2016-03-01 ENCOUNTER — Encounter: Payer: Self-pay | Admitting: Internal Medicine

## 2016-03-01 VITALS — BP 175/91 | HR 108 | Temp 98.8°F | Resp 18 | Ht 67.0 in | Wt 162.0 lb

## 2016-03-01 DIAGNOSIS — G4733 Obstructive sleep apnea (adult) (pediatric): Secondary | ICD-10-CM | POA: Diagnosis not present

## 2016-03-01 DIAGNOSIS — Z881 Allergy status to other antibiotic agents status: Secondary | ICD-10-CM | POA: Diagnosis not present

## 2016-03-01 DIAGNOSIS — Z86718 Personal history of other venous thrombosis and embolism: Secondary | ICD-10-CM | POA: Diagnosis not present

## 2016-03-01 DIAGNOSIS — N186 End stage renal disease: Secondary | ICD-10-CM | POA: Insufficient documentation

## 2016-03-01 DIAGNOSIS — Z992 Dependence on renal dialysis: Secondary | ICD-10-CM | POA: Diagnosis not present

## 2016-03-01 DIAGNOSIS — N032 Chronic nephritic syndrome with diffuse membranous glomerulonephritis: Secondary | ICD-10-CM | POA: Diagnosis not present

## 2016-03-01 DIAGNOSIS — J45909 Unspecified asthma, uncomplicated: Secondary | ICD-10-CM | POA: Insufficient documentation

## 2016-03-01 DIAGNOSIS — B20 Human immunodeficiency virus [HIV] disease: Secondary | ICD-10-CM | POA: Diagnosis not present

## 2016-03-01 DIAGNOSIS — Z882 Allergy status to sulfonamides status: Secondary | ICD-10-CM | POA: Insufficient documentation

## 2016-03-01 DIAGNOSIS — I120 Hypertensive chronic kidney disease with stage 5 chronic kidney disease or end stage renal disease: Secondary | ICD-10-CM | POA: Insufficient documentation

## 2016-03-01 DIAGNOSIS — Z79899 Other long term (current) drug therapy: Secondary | ICD-10-CM | POA: Diagnosis not present

## 2016-03-01 DIAGNOSIS — Z888 Allergy status to other drugs, medicaments and biological substances status: Secondary | ICD-10-CM | POA: Diagnosis not present

## 2016-03-01 DIAGNOSIS — D62 Acute posthemorrhagic anemia: Secondary | ICD-10-CM | POA: Insufficient documentation

## 2016-03-01 NOTE — Patient Instructions (Signed)
DASH Eating Plan DASH stands for "Dietary Approaches to Stop Hypertension." The DASH eating plan is a healthy eating plan that has been shown to reduce high blood pressure (hypertension). Additional health benefits may include reducing the risk of type 2 diabetes mellitus, heart disease, and stroke. The DASH eating plan may also help with weight loss. What do I need to know about the DASH eating plan? For the DASH eating plan, you will follow these general guidelines:  Choose foods with less than 150 milligrams of sodium per serving (as listed on the food label).  Use salt-free seasonings or herbs instead of table salt or sea salt.  Check with your health care provider or pharmacist before using salt substitutes.  Eat lower-sodium products. These are often labeled as "low-sodium" or "no salt added."  Eat fresh foods. Avoid eating a lot of canned foods.  Eat more vegetables, fruits, and low-fat dairy products.  Choose whole grains. Look for the word "whole" as the first word in the ingredient list.  Choose fish and skinless chicken or turkey more often than red meat. Limit fish, poultry, and meat to 6 oz (170 g) each day.  Limit sweets, desserts, sugars, and sugary drinks.  Choose heart-healthy fats.  Eat more home-cooked food and less restaurant, buffet, and fast food.  Limit fried foods.  Do not fry foods. Cook foods using methods such as baking, boiling, grilling, and broiling instead.  When eating at a restaurant, ask that your food be prepared with less salt, or no salt if possible. What foods can I eat? Seek help from a dietitian for individual calorie needs. Grains  Whole grain or whole wheat bread. Brown rice. Whole grain or whole wheat pasta. Quinoa, bulgur, and whole grain cereals. Low-sodium cereals. Corn or whole wheat flour tortillas. Whole grain cornbread. Whole grain crackers. Low-sodium crackers. Vegetables  Fresh or frozen vegetables (raw, steamed, roasted, or  grilled). Low-sodium or reduced-sodium tomato and vegetable juices. Low-sodium or reduced-sodium tomato sauce and paste. Low-sodium or reduced-sodium canned vegetables. Fruits  All fresh, canned (in natural juice), or frozen fruits. Meat and Other Protein Products  Ground beef (85% or leaner), grass-fed beef, or beef trimmed of fat. Skinless chicken or turkey. Ground chicken or turkey. Pork trimmed of fat. All fish and seafood. Eggs. Dried beans, peas, or lentils. Unsalted nuts and seeds. Unsalted canned beans. Dairy  Low-fat dairy products, such as skim or 1% milk, 2% or reduced-fat cheeses, low-fat ricotta or cottage cheese, or plain low-fat yogurt. Low-sodium or reduced-sodium cheeses. Fats and Oils  Tub margarines without trans fats. Light or reduced-fat mayonnaise and salad dressings (reduced sodium). Avocado. Safflower, olive, or canola oils. Natural peanut or almond butter. Other  Unsalted popcorn and pretzels. The items listed above may not be a complete list of recommended foods or beverages. Contact your dietitian for more options.  What foods are not recommended? Grains  White bread. White pasta. White rice. Refined cornbread. Bagels and croissants. Crackers that contain trans fat. Vegetables  Creamed or fried vegetables. Vegetables in a cheese sauce. Regular canned vegetables. Regular canned tomato sauce and paste. Regular tomato and vegetable juices. Fruits  Canned fruit in light or heavy syrup. Fruit juice. Meat and Other Protein Products  Fatty cuts of meat. Ribs, chicken wings, bacon, sausage, bologna, salami, chitterlings, fatback, hot dogs, bratwurst, and packaged luncheon meats. Salted nuts and seeds. Canned beans with salt. Dairy  Whole or 2% milk, cream, half-and-half, and cream cheese. Whole-fat or sweetened yogurt. Full-fat cheeses   or blue cheese. Nondairy creamers and whipped toppings. Processed cheese, cheese spreads, or cheese curds. Condiments  Onion and garlic  salt, seasoned salt, table salt, and sea salt. Canned and packaged gravies. Worcestershire sauce. Tartar sauce. Barbecue sauce. Teriyaki sauce. Soy sauce, including reduced sodium. Steak sauce. Fish sauce. Oyster sauce. Cocktail sauce. Horseradish. Ketchup and mustard. Meat flavorings and tenderizers. Bouillon cubes. Hot sauce. Tabasco sauce. Marinades. Taco seasonings. Relishes. Fats and Oils  Butter, stick margarine, lard, shortening, ghee, and bacon fat. Coconut, palm kernel, or palm oils. Regular salad dressings. Other  Pickles and olives. Salted popcorn and pretzels. The items listed above may not be a complete list of foods and beverages to avoid. Contact your dietitian for more information.  Where can I find more information? National Heart, Lung, and Blood Institute: travelstabloid.com This information is not intended to replace advice given to you by your health care provider. Make sure you discuss any questions you have with your health care provider. Document Released: 02/02/2011 Document Revised: 07/22/2015 Document Reviewed: 12/18/2012 Elsevier Interactive Patient Education  2017 Else[employer] Inc[employer]   Chronic Kidney Disease, Adult Chronic kidney disease (CKD) happens when the kidneys are damaged during a time of 3 or more months. The kidneys are two organs that do many important jobs in the body. These jobs include:  Removing wastes and extra fluids from the blood.  Making hormones that maintain the amount of fluid in your tissues and blood vessels.  Making sure that the body has the right amount of fluids and chemicals. Most of the time, this condition does not go away, but it can usually be controlled. Steps must be taken to slow down the kidney damage or stop it from getting worse. Otherwise, the kidneys may stop working. Follow these instructions at home:  Follow your diet as told by your doctor. You may need to avoid alcohol, salty foods (sodium),  and foods that are high in potassium, calcium, and protein.  Take over-the-counter and prescription medicines only as told by your doctor. Do not take any new medicines unless your doctor says you can do that. These include vitamins and minerals.  Medicines and nutritional supplements can make kidney damage worse.  Your doctor may need to change how much medicine you take.  Do not use any tobacco products. These include cigarettes, chewing tobacco, and e-cigarettes. If you need help quitting, ask your doctor.  Keep all follow-up visits as told by your doctor. This is important.  Check your blood pressure. Tell your doctor if there are changes to your blood pressure.  Get to a healthy weight. Stay at that weight. If you need help with this, ask your doctor.  Start or continue an exercise plan. Try to exercise at least 30 minutes a day, 5 days a week.  Stay up-to-date with your shots (immunizations) as told by your doctor. Contact a doctor if:  Your symptoms get worse.  You have new symptoms. Get help right away if:  You have symptoms of end-stage kidney disease. These include:  Headaches.  Skin that is darker or lighter than normal.  Numbness in your hands or feet.  Easy bruising.  Having hiccups often.  Chest pain.  Shortness of breath.  Stopping of menstrual periods in women.  You have a fever.  You are making very little pee (urine).  You have pain or bleeding when you pee (urinate). This information is not intended to replace advice given to you by your health care provider. Make sure you  discuss any questions you have with your health care provider. Document Released: 05/10/2009 Document Revised: 07/22/2015 Document Reviewed: 10/13/2011 Elsevier Interactive Patient Education  2017 [place].  Dialysis Diet Introduction Dialysis is a treatment that cleans your blood. It is used when the kidneys are damaged. When you need dialysis, you should watch your  diet. This is because some nutrients can build up in your blood between treatments and make you sick. These nutrients are:  Potassium.  Phosphorus.  Sodium. Your doctor or dietitian will:  Tell you how much of these you can have.  Tell you if you need to look out for other nutrients too.  Help you plan meals.  Tell you how much to drink each day. What do I need to know about this diet?  Limit potassium. Potassium is in milk, fruits, and vegetables.  Limit phosphorus. Phosphorus is in milk, cheese, beans, nuts, and carbonated beverages.  Limit salt (sodium). Foods that have a lot sodium include processed and cured meats, ready-made frozen meals, canned vegetables, and salty snack foods.  Do not use salt substitutes.  Try not to eat whole-grain foods and foods that have a lot of fiber.  Follow your doctor's instructions about how much to drink. You may be told to:  Write down what you drink.  Write down foods you eat that are made mostly from water, such as gelatin and soups.  Drink from small cups.  Ask your doctor if you should take a medicine that binds phosphorus.  Take vitamin and mineral supplements only as told by your doctor.  Eat meat, poultry, fish, and eggs. Limit nuts and beans.  Before you cook potatoes, cut them into small pieces. Then boil them in unsalted water.  Drain all fluid from cooked vegetables and canned fruits before you eat them. What foods can I eat? Grains  White bread. White rice. Cooked cereal. Unsalted popcorn. Tortillas. Pasta. Vegetables  Fresh or frozen broccoli, carrots, and green beans. Cabbage. Cauliflower. Celery. Cucumbers. Eggplant. Radishes. Zucchini. Fruits  Apples. Fresh or frozen berries. Fresh or canned pears, peaches, and pineapple. Grapes. Plums. Meats and Other Protein Sources  Fresh or frozen beef, pork, chicken, and fish. Eggs. Low-sodium canned tuna or salmon. Dairy  Cream cheese. Heavy cream. Ricotta  cheese. Beverages  Apple cider. Cranberry juice. Grape juice. Lemonade. Black coffee. Condiments  Herbs. Spices. Jam and jelly. Honey. Sweets and Desserts  Sherbet. Cakes. Cookies. Fats and Oils  Olive oil, canola oil, and safflower oil. Other  Non-dairy creamer. Non-dairy whipped topping. Homemade broth without salt. The items listed above may not be a complete list of recommended foods or beverages. Contact your dietitian for more options.  What foods are not recommended? Grains  Whole-grain bread. Whole-grain pasta. High-fiber cereal. Vegetables  Potatoes. Beets. Tomatoes. Winter squash and pumpkin. Asparagus. Spinach. Parsnips. Fruits  Star fruit. Bananas. Oranges. Kiwi. Nectarines. Prunes. Melon. Dried fruit. Avocado. Meats and Other Protein Sources  Canned, smoked, and cured meats. Soil scientist. Sardines. Nuts and seeds. Peanut butter. Beans and legumes. Dairy  Milk. Buttermilk. Yogurt. Cheese and cottage cheese. Processed cheese spreads. Beverages  Orange juice. Prune juice. Carbonated soft drinks. Condiments  Salt. Salt substitutes. Soy sauce. Sweets and Desserts  Ice cream. Chocolate. Candied nuts. Fats and Oils  Butter. Margarine. Other  Ready-made frozen meals. Canned soups. The items listed above may not be a complete list of foods and beverages to avoid. Contact your dietitian for more information.  This information is not intended to  replace advice given to you by your health care provider. Make sure you discuss any questions you have with your health care provider. Document Released: 08/15/2011 Document Revised: 07/22/2015 Document Reviewed: 09/16/2013  2017 Elsevier

## 2016-03-01 NOTE — Progress Notes (Signed)
Jim Wyatt, is a 36 y.o. male  NWG:[id number]  [id number]:[id number]  DOB - 29-May-1980  Chief Complaint  Patient presents with  . Hospitalization Follow-up      Subjective:   Jim Wyatt is a 36 y.o. 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 Patient  presented to the hospital with complaint of worsening fatigue and malaise x 4 days. He was found to have a Hgb of 6.7 in the ED and received 1 unit of pRBC with an appropriate response Hgb to 8.3, then 7.9 on the day of discharge. He was not complaining of gross GI bleeding, but likely source from recent ileocecectomy 3 weeks ago. No s/s of hemolysis. Patient has lots of questions about his medical conditions. 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 He wants to know why he keeps dropping his hemoglobin and also why he is not on kidney transplant list despite that his mother has volunteered to donate her kidney. Patient has No headache, No chest pain, No abdominal pain - No Nausea, No new weakness tingling or numbness, No Cough - SOB. Patient still has fatigue despite improving Hb, endorses snoring and possible OSA. Never had sleep study.  No problems updated.  ALLERGIES: Allergies  Allergen Reactions  . Bactrim [Sulfamethoxazole-Trimethoprim] Other (See Comments)    Renal failure  . Vancomycin Other (See Comments)    Patient states vancomycin caused kidney injury  . Reglan [Metoclopramide] Other (See Comments)    Causes ticks/ jerks  . Amlodipine Palpitations and Other (See Comments)    fatigue  . Augmentin [Amoxicillin-Pot Clavulanate] Hives    PAST MEDICAL HISTORY: Past Medical History:  Diagnosis Date  . Asthma   . CAP (community acquired pneumonia) 10/2013  . Cellulitis 11/2013   LLE  . DVT (deep venous thrombosis) ([place]) 10/2013   BLE  . ESRD on hemodialysis ([place])  01/2015   biopsy proven FSGS with ATN on renal bx from Sept 2015. Went on HD Dec 2016.   [name] Fever   . Headache   . HIV infection ([place]) dx'd 10/2013  . Hypertension   . Leg pain   . Multiple environmental allergies    "trees, dogs, cats"  . Multiple food allergies   . Shingles < 2010  . 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 Authorizing Provider  abacavir (ZIAGEN) 300 MG tablet Take 2 tablets (600 mg total) by mouth daily with supper. 12/14/15  Yes Carlyle Basques, MD  acetaminophen (TYLENOL) 500 MG tablet Take 1,000 mg by mouth every 6 (six) hours as needed (pain).   Yes Historical Provider, MD  albuterol (PROVENTIL HFA;VENTOLIN HFA) 108 (90 Base) MCG/ACT inhaler Inhale 2 puffs into the lungs every 6 (six) hours as needed for wheezing or shortness of breath. 01/04/16  Yes Asencion Partridge, MD  Ascorbic Acid (VITAMIN C) 250 MG CHEW Chew 250 mg by mouth daily.   Yes Historical Provider, MD  azithromycin (ZITHROMAX) 600 MG tablet Take 2 tablets (1,200 mg total) by mouth once a week. On Mondays 01/11/16  Yes Asencion Partridge, MD  CARTIA XT 180 MG 24 hr capsule Take 180 mg by mouth daily. 12/11/15  Yes Historical Provider, MD  carvedilol (COREG) 25 MG tablet Take 25 mg by mouth 2 (two) times daily. 12/02/15  Yes Historical Provider, MD  Cyanocobalamin 2500 MCG CHEW Chew 5,000 mcg by mouth daily.   Yes Historical Provider, MD  dapsone 100 MG tablet Take 1 tablet (100 mg total) by mouth daily. 11/24/15  Yes Alexa Angela Burke, MD  darunavir-cobicistat (PREZCOBIX) 800-150 MG tablet Take 1 tablet by mouth every evening. Swallow whole. Do NOT crush, break or chew tablets. Take with food. 12/14/15  Yes Carlyle Basques, MD  dolutegravir (TIVICAY) 50 MG tablet Take 1 tablet (50 mg total) by mouth every evening. 12/14/15  Yes Carlyle Basques, MD  hydrALAZINE (APRESOLINE) 100 MG tablet Take 0.5 tablets (50 mg total) by  mouth 2 (two) times daily. 6:30am, 2:30pm, 11pm - for high blood pressure Patient taking differently: Take 100 mg by mouth 3 (three) times daily.  01/11/16  Yes Asencion Partridge, MD  HYDROcodone-acetaminophen (NORCO/VICODIN) 5-325 MG tablet Take 1-2 tablets by mouth every 6 (six) hours as needed for moderate pain or severe pain. 01/04/16  Yes Asencion Partridge, MD  levETIRAcetam (KEPPRA) 250 MG tablet Take 1 tablet (250 mg) by mouth on Tuesday, Thursday, Saturday at 7pm 02/03/16  Yes Cameron Sprang, MD  levETIRAcetam (KEPPRA) 500 MG tablet Take 1 tablet (500 mg) by mouth Sunday, Monday, Wednesday, Friday at 7pm (non-dialysis days) 02/03/16  Yes Cameron Sprang, MD  losartan (COZAAR) 100 MG tablet Take 100 mg by mouth at bedtime.   Yes Historical Provider, MD  ondansetron (ZOFRAN ODT) 8 MG disintegrating tablet Take 1 tablet (8 mg total) by mouth every 8 (eight) hours as needed for nausea or vomiting. 10/06/15  Yes Carlyle Basques, MD  sucroferric oxyhydroxide (VELPHORO) 500 MG chewable tablet Chew 500 mg by mouth See admin instructions. Chew 1 tablet (500 mg) by mouth after every meal (2-3 times daily)   Yes Historical Provider, MD    Objective:   Vitals:   03/01/16 1035  BP: (!) 175/91  Pulse: (!) 108  Resp: 18  Temp: 98.8 F (37.1 C)  TempSrc: Oral  SpO2: 95%  Weight: 162 lb (73.5 kg)  Height: 5\' 7"  (1.702 m)   Exam General appearance : Awake, alert, not in any distress. Speech Clear. Not toxic looking, chronically ill-looking HEENT: Atraumatic and Normocephalic, pupils equally reactive to light and accomodation Neck: Supple, no JVD. No cervical lymphadenopathy.  Chest: Good air entry bilaterally, no added sounds  CVS: S1 S2 regular, no murmurs.  Abdomen: Bowel sounds present, Non tender and not distended with no gaurding, rigidity or rebound. Extremities: B/L Lower Ext shows no edema, both legs are warm to touch Neurology: Awake alert, and oriented X 3, CN II-XII intact, Non focal Skin: No  Rash  Data Review No results found for: HGBA1C  Assessment & Plan   1. Postoperative anemia due to acute blood loss - Anemia multifactorial, most likely post-op related complicating anemia of chronic disease and ESRD - Continue current regimen of scheduled blood transfusion - Continue Epoetin injections - May need GI consult for Colonoscopy to r/o LGI Bleeding  2. Human immunodeficiency virus (HIV) disease ([place])  - Continue antiretroviral - Follow up with Infectious Disease Specialist  3. 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 Obstructive Sleep Apnea  - Split Night Study  Patient have been counseled extensively about nutrition and exercise. Other issues discussed during this visit include: low cholesterol diet, weight control and daily  exercise, importance of adherence with medications and regular follow-up. We also discussed long term complications of uncontrolled hypertension.   Return in about 3 months (around 05/30/2016) for CKD/ESRD, Follow up HTN.  The patient was given clear instructions to go to ER or return to medical center if symptoms don't improve, worsen or new problems develop. The patient verbalized understanding. The patient was told to call to get lab results if they haven't heard anything in the next week.   This note has been created with Surveyor, quantity. Any transcriptional errors are unintentional.    [name], MD, [place], Karilyn Cota, [place] and [place], [place]   03/01/2016, 11:41 AM

## 2016-03-02 DIAGNOSIS — D509 Iron deficiency anemia, unspecified: Secondary | ICD-10-CM | POA: Diagnosis not present

## 2016-03-02 DIAGNOSIS — N2581 Secondary hyperparathyroidism of renal origin: Secondary | ICD-10-CM | POA: Diagnosis not present

## 2016-03-02 DIAGNOSIS — N186 End stage renal disease: Secondary | ICD-10-CM | POA: Diagnosis not present

## 2016-03-03 DIAGNOSIS — E8770 Fluid overload, unspecified: Secondary | ICD-10-CM | POA: Diagnosis not present

## 2016-03-03 DIAGNOSIS — N186 End stage renal disease: Secondary | ICD-10-CM | POA: Diagnosis not present

## 2016-03-06 ENCOUNTER — Ambulatory Visit: Payer: Self-pay | Admitting: Internal Medicine

## 2016-03-07 DIAGNOSIS — D689 Coagulation defect, unspecified: Secondary | ICD-10-CM | POA: Diagnosis not present

## 2016-03-07 DIAGNOSIS — N2581 Secondary hyperparathyroidism of renal origin: Secondary | ICD-10-CM | POA: Diagnosis not present

## 2016-03-07 DIAGNOSIS — N186 End stage renal disease: Secondary | ICD-10-CM | POA: Diagnosis not present

## 2016-03-07 DIAGNOSIS — D631 Anemia in chronic kidney disease: Secondary | ICD-10-CM | POA: Diagnosis not present

## 2016-03-09 DIAGNOSIS — D689 Coagulation defect, unspecified: Secondary | ICD-10-CM | POA: Diagnosis not present

## 2016-03-09 DIAGNOSIS — N2581 Secondary hyperparathyroidism of renal origin: Secondary | ICD-10-CM | POA: Diagnosis not present

## 2016-03-09 DIAGNOSIS — D631 Anemia in chronic kidney disease: Secondary | ICD-10-CM | POA: Diagnosis not present

## 2016-03-09 DIAGNOSIS — N186 End stage renal disease: Secondary | ICD-10-CM | POA: Diagnosis not present

## 2016-03-11 DIAGNOSIS — N186 End stage renal disease: Secondary | ICD-10-CM | POA: Diagnosis not present

## 2016-03-11 DIAGNOSIS — D631 Anemia in chronic kidney disease: Secondary | ICD-10-CM | POA: Diagnosis not present

## 2016-03-11 DIAGNOSIS — D689 Coagulation defect, unspecified: Secondary | ICD-10-CM | POA: Diagnosis not present

## 2016-03-11 DIAGNOSIS — N2581 Secondary hyperparathyroidism of renal origin: Secondary | ICD-10-CM | POA: Diagnosis not present

## 2016-03-13 ENCOUNTER — Encounter: Payer: Self-pay | Admitting: Neurology

## 2016-03-13 ENCOUNTER — Ambulatory Visit (INDEPENDENT_AMBULATORY_CARE_PROVIDER_SITE_OTHER): Payer: Medicare Other | Admitting: Neurology

## 2016-03-13 VITALS — BP 170/84 | HR 64 | Ht 67.0 in | Wt 156.2 lb

## 2016-03-13 DIAGNOSIS — B20 Human immunodeficiency virus [HIV] disease: Secondary | ICD-10-CM

## 2016-03-13 DIAGNOSIS — R569 Unspecified convulsions: Secondary | ICD-10-CM | POA: Insufficient documentation

## 2016-03-13 MED ORDER — LEVETIRACETAM 250 MG PO TABS: ORAL_TABLET | ORAL | 11 refills | Status: DC

## 2016-03-13 MED ORDER — LEVETIRACETAM 500 MG PO TABS: ORAL_TABLET | ORAL | 11 refills | Status: DC
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
## 2016-03-13 NOTE — Patient Instructions (Signed)
It is strongly recommended that you take a seizure medication due to potential risk of having another seizure. Since you do not want to take a different medication at this time, it is recommended you continue on the [place]. [place] driving laws indicate that one should not drive until 6 months seizure-free. Flying a plane should not be done as well. Follow-up on as needed basis.

## 2016-03-15 ENCOUNTER — Inpatient Hospital Stay (HOSPITAL_COMMUNITY)
Admission: EM | Admit: 2016-03-15 | Discharge: 2016-03-21 | DRG: 393 | Disposition: A | Discharge status: Home / Self Care | Payer: Medicare Other | Attending: Internal Medicine | Admitting: Internal Medicine

## 2016-03-15 ENCOUNTER — Encounter (HOSPITAL_COMMUNITY): Payer: Self-pay | Admitting: *Deleted

## 2016-03-15 ENCOUNTER — Ambulatory Visit: Payer: Self-pay | Admitting: Internal Medicine

## 2016-03-15 ENCOUNTER — Emergency Department (HOSPITAL_COMMUNITY): Payer: Medicare Other

## 2016-03-15 DIAGNOSIS — N186 End stage renal disease: Secondary | ICD-10-CM | POA: Diagnosis present

## 2016-03-15 DIAGNOSIS — Z9049 Acquired absence of other specified parts of digestive tract: Secondary | ICD-10-CM | POA: Diagnosis not present

## 2016-03-15 DIAGNOSIS — Z992 Dependence on renal dialysis: Secondary | ICD-10-CM | POA: Diagnosis not present

## 2016-03-15 DIAGNOSIS — N2581 Secondary hyperparathyroidism of renal origin: Secondary | ICD-10-CM | POA: Diagnosis not present

## 2016-03-15 DIAGNOSIS — J45909 Unspecified asthma, uncomplicated: Secondary | ICD-10-CM | POA: Diagnosis present

## 2016-03-15 DIAGNOSIS — B20 Human immunodeficiency virus [HIV] disease: Secondary | ICD-10-CM | POA: Diagnosis present

## 2016-03-15 DIAGNOSIS — Z79899 Other long term (current) drug therapy: Secondary | ICD-10-CM

## 2016-03-15 DIAGNOSIS — R933 Abnormal findings on diagnostic imaging of other parts of digestive tract: Secondary | ICD-10-CM | POA: Diagnosis present

## 2016-03-15 DIAGNOSIS — F339 Major depressive disorder, recurrent, unspecified: Secondary | ICD-10-CM | POA: Diagnosis not present

## 2016-03-15 DIAGNOSIS — I120 Hypertensive chronic kidney disease with stage 5 chronic kidney disease or end stage renal disease: Secondary | ICD-10-CM | POA: Diagnosis present

## 2016-03-15 DIAGNOSIS — Z91018 Allergy to other foods: Secondary | ICD-10-CM

## 2016-03-15 DIAGNOSIS — F329 Major depressive disorder, single episode, unspecified: Secondary | ICD-10-CM | POA: Diagnosis present

## 2016-03-15 DIAGNOSIS — D62 Acute posthemorrhagic anemia: Secondary | ICD-10-CM | POA: Diagnosis present

## 2016-03-15 DIAGNOSIS — E8889 Other specified metabolic disorders: Secondary | ICD-10-CM | POA: Diagnosis present

## 2016-03-15 DIAGNOSIS — G40909 Epilepsy, unspecified, not intractable, without status epilepticus: Secondary | ICD-10-CM

## 2016-03-15 DIAGNOSIS — Z880 Allergy status to penicillin: Secondary | ICD-10-CM

## 2016-03-15 DIAGNOSIS — Z888 Allergy status to other drugs, medicaments and biological substances status: Secondary | ICD-10-CM | POA: Diagnosis not present

## 2016-03-15 DIAGNOSIS — N63 Unspecified lump in unspecified breast: Secondary | ICD-10-CM

## 2016-03-15 DIAGNOSIS — K921 Melena: Secondary | ICD-10-CM

## 2016-03-15 DIAGNOSIS — Z9119 Patient's noncompliance with other medical treatment and regimen: Secondary | ICD-10-CM | POA: Diagnosis not present

## 2016-03-15 DIAGNOSIS — Z9889 Other specified postprocedural states: Secondary | ICD-10-CM | POA: Diagnosis not present

## 2016-03-15 DIAGNOSIS — D631 Anemia in chronic kidney disease: Secondary | ICD-10-CM | POA: Diagnosis present

## 2016-03-15 DIAGNOSIS — K922 Gastrointestinal hemorrhage, unspecified: Secondary | ICD-10-CM | POA: Diagnosis not present

## 2016-03-15 DIAGNOSIS — K6389 Other specified diseases of intestine: Secondary | ICD-10-CM | POA: Diagnosis not present

## 2016-03-15 DIAGNOSIS — R109 Unspecified abdominal pain: Secondary | ICD-10-CM | POA: Diagnosis not present

## 2016-03-15 DIAGNOSIS — K625 Hemorrhage of anus and rectum: Secondary | ICD-10-CM | POA: Diagnosis not present

## 2016-03-15 DIAGNOSIS — D649 Anemia, unspecified: Secondary | ICD-10-CM | POA: Diagnosis not present

## 2016-03-15 DIAGNOSIS — K5090 Crohn's disease, unspecified, without complications: Secondary | ICD-10-CM | POA: Diagnosis not present

## 2016-03-15 DIAGNOSIS — R1084 Generalized abdominal pain: Secondary | ICD-10-CM | POA: Diagnosis not present

## 2016-03-15 DIAGNOSIS — E875 Hyperkalemia: Secondary | ICD-10-CM | POA: Diagnosis not present

## 2016-03-15 DIAGNOSIS — Z882 Allergy status to sulfonamides status: Secondary | ICD-10-CM

## 2016-03-15 DIAGNOSIS — K5730 Diverticulosis of large intestine without perforation or abscess without bleeding: Secondary | ICD-10-CM | POA: Diagnosis present

## 2016-03-15 DIAGNOSIS — K644 Residual hemorrhoidal skin tags: Secondary | ICD-10-CM | POA: Diagnosis present

## 2016-03-15 DIAGNOSIS — Z86718 Personal history of other venous thrombosis and embolism: Secondary | ICD-10-CM

## 2016-03-15 DIAGNOSIS — K648 Other hemorrhoids: Secondary | ICD-10-CM | POA: Diagnosis not present

## 2016-03-15 DIAGNOSIS — Z841 Family history of disorders of kidney and ureter: Secondary | ICD-10-CM | POA: Diagnosis not present

## 2016-03-15 DIAGNOSIS — Z79891 Long term (current) use of opiate analgesic: Secondary | ICD-10-CM

## 2016-03-15 DIAGNOSIS — Z833 Family history of diabetes mellitus: Secondary | ICD-10-CM | POA: Diagnosis not present

## 2016-03-15 DIAGNOSIS — Z8249 Family history of ischemic heart disease and other diseases of the circulatory system: Secondary | ICD-10-CM

## 2016-03-15 DIAGNOSIS — R509 Fever, unspecified: Secondary | ICD-10-CM | POA: Diagnosis not present

## 2016-03-15 DIAGNOSIS — Z8719 Personal history of other diseases of the digestive system: Secondary | ICD-10-CM

## 2016-03-15 LAB — CBC
HCT: 22.7 % — ABNORMAL LOW (ref 39.0–52.0)
Hemoglobin: 7.3 g/dL — ABNORMAL LOW (ref 13.0–17.0)
MCH: 30.3 pg (ref 26.0–34.0)
MCHC: 32.2 g/dL (ref 30.0–36.0)
MCV: 94.2 fL (ref 78.0–100.0)
Platelets: 113 10*3/uL — ABNORMAL LOW (ref 150–400)
RBC: 2.41 MIL/uL — ABNORMAL LOW (ref 4.22–5.81)
RDW: 19.2 % — ABNORMAL HIGH (ref 11.5–15.5)
WBC: 4.2 10*3/uL (ref 4.0–10.5)

## 2016-03-15 LAB — COMPREHENSIVE METABOLIC PANEL
ALT: 12 U/L — ABNORMAL LOW (ref 17–63)
AST: 30 U/L (ref 15–41)
Albumin: 2.8 g/dL — ABNORMAL LOW (ref 3.5–5.0)
Alkaline Phosphatase: 44 U/L (ref 38–126)
Anion gap: 18 — ABNORMAL HIGH (ref 5–15)
BUN: 95 mg/dL — ABNORMAL HIGH (ref 6–20)
CO2: 23 mmol/L (ref 22–32)
Calcium: 8.1 mg/dL — ABNORMAL LOW (ref 8.9–10.3)
Chloride: 94 mmol/L — ABNORMAL LOW (ref 101–111)
Creatinine, Ser: 15.58 mg/dL — ABNORMAL HIGH (ref 0.61–1.24)
GFR calc Af Amer: 4 mL/min — ABNORMAL LOW (ref >60)
GFR calc non Af Amer: 4 mL/min — ABNORMAL LOW (ref >60)
Glucose, Bld: 84 mg/dL (ref 65–99)
Potassium: 5.5 mmol/L — ABNORMAL HIGH (ref 3.5–5.1)
Sodium: 135 mmol/L (ref 135–145)
Total Bilirubin: 0.7 mg/dL (ref 0.3–1.2)
Total Protein: 7.2 g/dL (ref 6.5–8.1)

## 2016-03-15 LAB — CBC WITH DIFFERENTIAL/PLATELET
Basophils Absolute: 0 10*3/uL (ref 0.0–0.1)
Basophils Relative: 0 %
Eosinophils Absolute: 0 10*3/uL (ref 0.0–0.7)
Eosinophils Relative: 1 %
HCT: 19.8 % — ABNORMAL LOW (ref 39.0–52.0)
Hemoglobin: 6.4 g/dL — CL (ref 13.0–17.0)
Lymphocytes Relative: 16 %
Lymphs Abs: 0.6 10*3/uL — ABNORMAL LOW (ref 0.7–4.0)
MCH: 30.8 pg (ref 26.0–34.0)
MCHC: 32.3 g/dL (ref 30.0–36.0)
MCV: 95.2 fL (ref 78.0–100.0)
Monocytes Absolute: 0.3 10*3/uL (ref 0.1–1.0)
Monocytes Relative: 8 %
Neutro Abs: 3 10*3/uL (ref 1.7–7.7)
Neutrophils Relative %: 75 %
Platelets: 102 10*3/uL — ABNORMAL LOW (ref 150–400)
RBC: 2.08 MIL/uL — ABNORMAL LOW (ref 4.22–5.81)
RDW: 18.6 % — ABNORMAL HIGH (ref 11.5–15.5)
WBC: 3.9 10*3/uL — ABNORMAL LOW (ref 4.0–10.5)

## 2016-03-15 LAB — PREPARE RBC (CROSSMATCH)

## 2016-03-15 LAB — LACTATE DEHYDROGENASE: LDH: 379 U/L — ABNORMAL HIGH (ref 98–192)

## 2016-03-15 LAB — POC OCCULT BLOOD, ED: Fecal Occult Bld: POSITIVE — AB

## 2016-03-15 MED ORDER — ONDANSETRON HCL 4 MG/2ML IJ SOLN
4.0000 mg | Freq: Once | INTRAMUSCULAR | Status: AC
  Administered 2016-03-15: 4 mg via INTRAVENOUS
  Filled 2016-03-15: qty 2

## 2016-03-15 MED ORDER — LEVETIRACETAM 500 MG PO TABS
500.0000 mg | ORAL_TABLET | ORAL | Status: DC
  Filled 2016-03-15: qty 1

## 2016-03-15 MED ORDER — ACETAMINOPHEN 650 MG RE SUPP: 650.0000 mg | Freq: Four times a day (QID) | RECTAL | Status: DC | PRN

## 2016-03-15 MED ORDER — MORPHINE SULFATE (PF) 4 MG/ML IV SOLN
4.0000 mg | Freq: Once | INTRAVENOUS | Status: AC
  Administered 2016-03-15: 4 mg via INTRAVENOUS
  Filled 2016-03-15: qty 1

## 2016-03-15 MED ORDER — IOPAMIDOL (ISOVUE-300) INJECTION 61%
INTRAVENOUS | Status: AC
  Filled 2016-03-15: qty 30

## 2016-03-15 MED ORDER — DILTIAZEM HCL ER COATED BEADS 180 MG PO CP24
180.0000 mg | ORAL_CAPSULE | Freq: Every day | ORAL | Status: DC
  Administered 2016-03-16 – 2016-03-21 (×5): 180 mg via ORAL
  Filled 2016-03-15 (×6): qty 1

## 2016-03-15 MED ORDER — DARUNAVIR-COBICISTAT 800-150 MG PO TABS
1.0000 | ORAL_TABLET | Freq: Every evening | ORAL | Status: DC
  Administered 2016-03-16 – 2016-03-20 (×5): 1 via ORAL
  Filled 2016-03-15 (×7): qty 1

## 2016-03-15 MED ORDER — CARVEDILOL 25 MG PO TABS
25.0000 mg | ORAL_TABLET | Freq: Two times a day (BID) | ORAL | Status: DC
  Administered 2016-03-15 – 2016-03-21 (×10): 25 mg via ORAL
  Filled 2016-03-15 (×10): qty 1

## 2016-03-15 MED ORDER — LEVETIRACETAM 250 MG PO TABS
250.0000 mg | ORAL_TABLET | ORAL | Status: DC
  Administered 2016-03-16: 250 mg via ORAL
  Filled 2016-03-15 (×2): qty 1

## 2016-03-15 MED ORDER — DAPSONE 100 MG PO TABS
100.0000 mg | ORAL_TABLET | Freq: Every day | ORAL | Status: DC
  Administered 2016-03-16 – 2016-03-21 (×6): 100 mg via ORAL
  Filled 2016-03-15 (×8): qty 1

## 2016-03-15 MED ORDER — SENNOSIDES-DOCUSATE SODIUM 8.6-50 MG PO TABS: 1.0000 | ORAL_TABLET | Freq: Every evening | ORAL | Status: DC | PRN

## 2016-03-15 MED ORDER — SODIUM CHLORIDE 0.9 % IV SOLN: Freq: Once | INTRAVENOUS | Status: DC

## 2016-03-15 MED ORDER — HYDROCODONE-ACETAMINOPHEN 5-325 MG PO TABS
1.0000 | ORAL_TABLET | Freq: Four times a day (QID) | ORAL | Status: DC | PRN
  Administered 2016-03-15 – 2016-03-20 (×5): 2 via ORAL
  Filled 2016-03-15 (×4): qty 2

## 2016-03-15 MED ORDER — SODIUM CHLORIDE 0.9 % IV SOLN
Freq: Once | INTRAVENOUS | Status: AC
  Administered 2016-03-15: 13:00:00 via INTRAVENOUS

## 2016-03-15 MED ORDER — ABACAVIR SULFATE 300 MG PO TABS
600.0000 mg | ORAL_TABLET | Freq: Every day | ORAL | Status: DC
  Administered 2016-03-16 – 2016-03-20 (×5): 600 mg via ORAL
  Filled 2016-03-15 (×7): qty 2

## 2016-03-15 MED ORDER — ONDANSETRON HCL 4 MG PO TABS
4.0000 mg | ORAL_TABLET | Freq: Four times a day (QID) | ORAL | Status: DC | PRN
  Administered 2016-03-20: 4 mg via ORAL
  Filled 2016-03-15: qty 1

## 2016-03-15 MED ORDER — LOSARTAN POTASSIUM 50 MG PO TABS
100.0000 mg | ORAL_TABLET | Freq: Every day | ORAL | Status: DC
  Administered 2016-03-15 – 2016-03-20 (×6): 100 mg via ORAL
  Filled 2016-03-15 (×6): qty 2

## 2016-03-15 MED ORDER — ACETAMINOPHEN 325 MG PO TABS
650.0000 mg | ORAL_TABLET | Freq: Four times a day (QID) | ORAL | Status: DC | PRN
  Administered 2016-03-17: 650 mg via ORAL
  Filled 2016-03-15: qty 2

## 2016-03-15 MED ORDER — SODIUM CHLORIDE 0.9% FLUSH
3.0000 mL | Freq: Two times a day (BID) | INTRAVENOUS | Status: DC
  Administered 2016-03-15 – 2016-03-21 (×10): 3 mL via INTRAVENOUS

## 2016-03-15 MED ORDER — DOLUTEGRAVIR SODIUM 50 MG PO TABS
50.0000 mg | ORAL_TABLET | Freq: Every evening | ORAL | Status: DC
  Administered 2016-03-16 – 2016-03-20 (×4): 50 mg via ORAL
  Filled 2016-03-15 (×5): qty 1

## 2016-03-15 MED ORDER — HYDRALAZINE HCL 50 MG PO TABS
50.0000 mg | ORAL_TABLET | Freq: Three times a day (TID) | ORAL | Status: DC
  Administered 2016-03-15 – 2016-03-21 (×13): 50 mg via ORAL
  Filled 2016-03-15 (×14): qty 1

## 2016-03-15 MED ORDER — ONDANSETRON HCL 4 MG/2ML IJ SOLN
4.0000 mg | Freq: Four times a day (QID) | INTRAMUSCULAR | Status: DC | PRN
  Administered 2016-03-15 – 2016-03-19 (×3): 4 mg via INTRAVENOUS
  Filled 2016-03-15 (×4): qty 2

## 2016-03-15 NOTE — ED Notes (Signed)
Pt oob to br with steady gait 

## 2016-03-15 NOTE — ED Notes (Signed)
Lab called critical hgb of 6.4

## 2016-03-15 NOTE — Progress Notes (Signed)
New Admission Note: transfer from ED  Arrival Method:  stretcher Mental Orientation: a/ox4 Telemetry:placed Assessment: Completed Skin: cleas dry intact IV:Rh 1 unit RBC Pain: none Tubes: none Safety Measures: Safety Fall Prevention Plan has been given, discussed and signed Admission: Completed Unit Orientation: Patient has been orientated to the room, unit and staff.  Family: none present  Orders have been reviewed and implemented. Will continue to monitor the patient. Call light has been placed within reach and bed alarm has been activated.   Retta Mac BSN, RN

## 2016-03-15 NOTE — ED Triage Notes (Addendum)
Pt states he has been vomiting dark red blood and noticing blood in his stools for 2 days.  Dialysis T-H-S, last dialysis sat.

## 2016-03-15 NOTE — ED Notes (Signed)
Pt does n ot return from bathroom and states through door that he is ok but unable to leave bathroom. Blood returned to blood bank due to time laspse.

## 2016-03-15 NOTE — ED Provider Notes (Addendum)
[facility] DEPT Provider Note   CSN: [id number] Arrival date & time: 03/15/16 5621     History    Chief Complaint  Patient presents with  . Rectal Bleeding  . Hematemesis     HPI Jim Wyatt is a 36 y.o. male.  36yo M w/ extensive PMH including HIV, ESRD on HD, HTn who p/w bloody stool. The patient states that he has had 2 days of bright red blood in his stool and several episodes of diarrhea daily. He reports associated generalized abdominal pain. He had bloody stools once previously, just after he had surgery for intussusception. He never required intervention from gastroenterology and it eventually resolved. He states that he has a long history of vomiting but in contrast to triage note, he has not had any hematemesis. He denies any fevers, inside use, or alcohol use. Last dialysis was 4 days ago, he missed yesterday because he was sick.   Past Medical History:  Diagnosis Date  . Asthma   . CAP (community acquired pneumonia) 10/2013  . Cellulitis 11/2013   LLE  . DVT (deep venous thrombosis) ([place]) 10/2013   BLE  . 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 Went on HD Dec 2016.   [name] Fever   . Headache   . HIV infection ([place]) dx'd 10/2013  . Hypertension   . Leg pain   . Multiple environmental allergies    "trees, dogs, cats"  . Multiple food allergies   . Shingles < 2010  . 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 GI bleed 03/15/2016  . New onset seizure ([place]) 03/13/2016  . Symptomatic anemia 02/01/2016  . Dysuria 01/18/2016  . Hematochezia 01/09/2016  . Postoperative anemia due to acute blood loss 01/09/2016  . Seizure disorder ([place]) 12/29/2015  . Intussusception ([place]) 12/28/2015  . Hypertension 12/28/2015  . Otitis externa of both ears 11/21/2015  . AIDS (acquired immunodeficiency syndrome), CD4 <=200 ([place])   . Meningitis   . Cephalalgia   .  Blurry vision, bilateral   . Headache   . Hypertensive urgency   . New onset of headache in immunocompromised patient ([place]) 07/06/2015  . ESRD on dialysis ([place]) 04/27/2015  . CAP (community acquired pneumonia) 02/25/2015  . ESRD (end stage renal disease) ([place]) 02/16/2015  . History of DVT (deep vein thrombosis) 01/12/2015  . Candidal esophagitis ([place])   . Constipation   . Nausea without vomiting 12/20/2014  . Abscess of right thigh 12/19/2014  . Chronic kidney disease (CKD), stage IV (severe) ([place]) 12/19/2014  . 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 Anemia due to end stage renal disease ([place]) 12/19/2014  . Hyponatremia 12/19/2014  . Community acquired pneumonia 07/30/2014  . Anemia of chronic disease 07/30/2014  . Acute on chronic renal failure ([place])   . 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 Human immunodeficiency virus (HIV) disease ([place]) 12/11/2013  . CMV (cytomegalovirus infection) ([place]) 12/07/2013  . Protein-calorie malnutrition, severe ([place]) 12/06/2013  . 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 DVT (deep venous thrombosis) ([place]) 11/24/2013  . Asthma, chronic 11/24/2013  . Acquired immune deficiency syndrome ([place]) 11/14/2013  . AKI (acute kidney injury) ([place]) 11/11/2013  . Elevated liver enzymes 11/09/2013  . History of shingles 11/09/2013  . Obesity 11/09/2013  . Abdominal pain 11/07/2013  . Asthma     Past Surgical History:  Procedure Laterality  Date  . AV FISTULA PLACEMENT Left 03/03/2015   Procedure: Creation of Left Arm RADIOCEPHALIC ARTERIOVENOUS FISTULA ;  Surgeon: Mal Misty, MD;  Location: [facility];  Service: Vascular;  Laterality: Left;  . FEMUR FRACTURE SURGERY Right 09/2001  . FRACTURE SURGERY    . I&D EXTREMITY Right 12/18/2014   Procedure: IRRIGATION AND DEBRIDEMENT RIGHT LOWER EXTREMITY;  Surgeon: Ralene Ok, MD;  Location: [facility];  Service: General;  Laterality: Right;  . INSERTION OF DIALYSIS  CATHETER  2016  . LAPAROTOMY N/A 12/28/2015   Procedure: EXPLORATORY LAPAROTOMY BOWEL RESECTION ILEOCECECTOMY;  Surgeon: Clovis Riley, MD;  Location: [facility];  Service: General;  Laterality: N/A;        Home Medications    Prior to Admission medications   Medication Sig Start Date End Date Taking? Authorizing Provider  abacavir (ZIAGEN) 300 MG tablet Take 2 tablets (600 mg total) by mouth daily with supper. 12/14/15  Yes Carlyle Basques, MD  acetaminophen (TYLENOL) 500 MG tablet Take 1,000 mg by mouth every 6 (six) hours as needed (pain).   Yes Historical Provider, MD  albuterol (PROVENTIL HFA;VENTOLIN HFA) 108 (90 Base) MCG/ACT inhaler Inhale 2 puffs into the lungs every 6 (six) hours as needed for wheezing or shortness of breath. 01/04/16  Yes Asencion Partridge, MD  Ascorbic Acid (VITAMIN C) 250 MG CHEW Chew 250 mg by mouth daily.   Yes Historical Provider, MD  azithromycin (ZITHROMAX) 600 MG tablet Take 2 tablets (1,200 mg total) by mouth once a week. On Mondays 01/11/16  Yes Asencion Partridge, MD  CARTIA XT 180 MG 24 hr capsule Take 180 mg by mouth daily. 12/11/15  Yes Historical Provider, MD  carvedilol (COREG) 25 MG tablet Take 25 mg by mouth 2 (two) times daily. 12/02/15  Yes Historical Provider, MD  Cyanocobalamin 2500 MCG CHEW Chew 5,000 mcg by mouth daily.   Yes Historical Provider, MD  dapsone 100 MG tablet Take 1 tablet (100 mg total) by mouth daily. 11/24/15  Yes Alexa Angela Burke, MD  darunavir-cobicistat (PREZCOBIX) 800-150 MG tablet Take 1 tablet by mouth every evening. Swallow whole. Do NOT crush, break or chew tablets. Take with food. 12/14/15  Yes Carlyle Basques, MD  dolutegravir (TIVICAY) 50 MG tablet Take 1 tablet (50 mg total) by mouth every evening. 12/14/15  Yes Carlyle Basques, MD  hydrALAZINE (APRESOLINE) 100 MG tablet Take 0.5 tablets (50 mg total) by mouth 2 (two) times daily. 6:30am, 2:30pm, 11pm - for high blood pressure Patient taking differently: Take 100 mg by mouth 3 (three)  times daily.  01/11/16  Yes Asencion Partridge, MD  HYDROcodone-acetaminophen (NORCO/VICODIN) 5-325 MG tablet Take 1-2 tablets by mouth every 6 (six) hours as needed for moderate pain or severe pain. 01/04/16  Yes Asencion Partridge, MD  levETIRAcetam (KEPPRA) 250 MG tablet Take 1 tablet (250 mg) by mouth on Tuesday, Thursday, Saturday at 7pm Patient taking differently: Take 250 mg by mouth See admin instructions. Take 1 tablet (250 mg) by mouth on Tuesday, Thursday, Saturday at 7pm 03/13/16  Yes Cameron Sprang, MD  levETIRAcetam (KEPPRA) 500 MG tablet Take 1 tablet (500 mg) by mouth Sunday, Monday, Wednesday, Friday at 7pm (non-dialysis days) 03/13/16  Yes Cameron Sprang, MD  losartan (COZAAR) 100 MG tablet Take 100 mg by mouth at bedtime.   Yes Historical Provider, MD  ondansetron (ZOFRAN ODT) 8 MG disintegrating tablet Take 1 tablet (8 mg total) by mouth every 8 (eight) hours as needed for nausea or vomiting. Patient  not taking: Reported on 03/15/2016 10/06/15   Carlyle Basques, MD  sucroferric oxyhydroxide (VELPHORO) 500 MG chewable tablet Chew 500 mg by mouth See admin instructions. Chew 1 tablet (500 mg) by mouth after every meal (2-3 times daily)    Historical Provider, MD      Family History  Problem Relation Age of Onset  . Hypertension Mother   . Diabetes Maternal Grandmother   . Kidney disease Maternal Grandmother      Social History  Substance Use Topics  . Smoking status: Never Smoker  . Smokeless tobacco: Never Used  . Alcohol use No     Comment: I stopped drinking along time ago "     Allergies     Bactrim [sulfamethoxazole-trimethoprim]; Vancomycin; Reglan [metoclopramide]; Amlodipine; and Augmentin [amoxicillin-pot clavulanate]    Review of Systems  10 Systems reviewed and are negative for acute change except as noted in the HPI.   Physical Exam Updated Vital Signs BP (!) 162/113 (BP Location: Right Arm)   Pulse 99   Temp 99 F (37.2 C) (Oral)   Resp 20   Ht 5\' 7"   (1.702 m)   Wt 150 lb (68 kg)   SpO2 95%   BMI 23.49 kg/m   Physical Exam  Constitutional: He is oriented to person, place, and time. He appears well-developed and well-nourished.  uncomfortable  HENT:  Head: Normocephalic and atraumatic.  Moist mucous membranes  Eyes: Conjunctivae are normal. Pupils are equal, round, and reactive to light.  Neck: Neck supple.  Cardiovascular: Normal rate, regular rhythm and normal heart sounds.   No murmur heard. Pulmonary/Chest: Effort normal and breath sounds normal.  Abdominal: Soft. Bowel sounds are normal. He exhibits no distension. There is tenderness. There is no rebound and no guarding.  Generalized abd tenderness without focal tenderness or peritonitis  Genitourinary:  Genitourinary Comments: Small, non-thrombosed, non bleeding external hemorrhoid; bright red blood on rectal exam  Musculoskeletal: He exhibits no edema.  Neurological: He is alert and oriented to person, place, and time.  Fluent speech  Skin: Skin is warm and dry.  Psychiatric:  Distressed, uncomfortable  Nursing note and vitals reviewed. Chaperone was present during exam.     ED Treatments / Results  Labs (all labs ordered are listed, but only abnormal results are displayed) Labs Reviewed  COMPREHENSIVE METABOLIC PANEL - Abnormal; Notable for the following:       Result Value   Potassium 5.5 (*)    Chloride 94 (*)    BUN 95 (*)    Creatinine, Ser 15.58 (*)    Calcium 8.1 (*)    Albumin 2.8 (*)    ALT 12 (*)    GFR calc non Af Amer 4 (*)    GFR calc Af Amer 4 (*)    Anion gap 18 (*)    All other components within normal limits  CBC WITH DIFFERENTIAL/PLATELET - Abnormal; Notable for the following:    WBC 3.9 (*)    RBC 2.08 (*)    Hemoglobin 6.4 (*)    HCT 19.8 (*)    RDW 18.6 (*)    Platelets 102 (*)    Lymphs Abs 0.6 (*)    All other components within normal limits  POC OCCULT BLOOD, ED - Abnormal; Notable for the following:    Fecal Occult Bld  POSITIVE (*)    All other components within normal limits  TYPE AND SCREEN  PREPARE RBC (CROSSMATCH)     EKG  EKG Interpretation  Date/Time:  Ventricular Rate:    PR Interval:    QRS Duration:   QT Interval:    QTC Calculation:   R Axis:     Text Interpretation:           Radiology Dg Abd 1 View  Result Date: 03/15/2016 CLINICAL DATA:  Abdominal pain EXAM: ABDOMEN - 1 VIEW COMPARISON:  CT abdomen and pelvis February 01, 2016 FINDINGS: The there is moderate stool in the colon. There is postoperative change in the right mid abdomen. There is no bowel dilatation or air-fluid level suggesting bowel obstruction. No free air. There are small phleboliths in the pelvis. IMPRESSION: No bowel obstruction or free air. Postoperative change right mid abdomen. Electronically Signed   By: Lowella Grip III M.D.   On: 03/15/2016 11:53    Procedures Procedures (including critical care time) .Critical Care Performed by: Sharlett Iles Authorized by: Sharlett Iles   Critical care provider statement:    Critical care time (minutes):  45   Critical care time was exclusive of:  Separately billable procedures and treating other patients   Critical care was necessary to treat or prevent imminent or life-threatening deterioration of the following conditions: GI bleeding, anemia.   Critical care was time spent personally by me on the following activities:  Development of treatment plan with patient or surrogate, discussions with consultants, evaluation of patient's response to treatment, examination of patient, obtaining history from patient or surrogate, ordering and performing treatments and interventions, ordering and review of laboratory studies, ordering and review of radiographic studies, re-evaluation of patient's condition and review of old charts    Medications Ordered in ED  Medications  0.9 %  sodium chloride infusion (not administered)  morphine 4 MG/ML injection  4 mg (4 mg Intravenous Given 03/15/16 1207)  ondansetron (ZOFRAN) injection 4 mg (4 mg Intravenous Given 03/15/16 1207)     Initial Impression / Assessment and Plan / ED Course  I have reviewed the triage vital signs and the nursing notes.  Pertinent labs & imaging results that were available during my care of the patient were reviewed by me and considered in my medical decision making (see chart for details).  Clinical Course     PT w/ h/o anemia and previous surgical repair of intussusception presents with 2 days of abdominal pain associated with bloody stools. He was uncomfortable but nontoxic on exam. BP and heart rate reassuring. BRB noted on rectal exam. KUB shows no signs of obstruction. Labs show potassium 5.5 without significant EKG changes, PVC 3.9, hemoglobin 6.4 which is lower than previous. Obtained consent and transfused 1u blood.  Discussed w/ nephrology, Dr. Hollie Salk; I appreciate her assistance. She will arrange for dialysis during admission. Discussed w/ GI, Dr. Collene Mares, I appreciate her willingness to see pt in consultation. Discussed admission with internal medicine teaching service. Patient admitted for further care.  Final Clinical Impressions(s) / ED Diagnoses   Final diagnoses:  Rectal bleeding  Anemia, unspecified type  Hyperkalemia     New Prescriptions   No medications on file       Sharlett Iles, MD 03/15/16 [place], MD 03/15/16 1353

## 2016-03-15 NOTE — H&P (Signed)
Date: 03/15/2016               Patient Name:  Jim Wyatt MRN: [id number]  DOB: 1980-07-29 Age / Sex: 36 y.o., male   PCP: Jim Garter, MD         Medical Service: Internal Medicine Teaching Service         Attending Physician: Dr. Sid Falcon, MD    First Contact: Dr. Einar Wyatt Pager: [phone]  Second Contact: Dr. Burgess Wyatt Pager: [phone]       After Hours (After 5p/  First Contact Pager: [phone]  weekends / holidays): Second Contact Pager: [phone]   Chief Complaint: rectal bleeding  History of Present Illness:  [name] is a 36 year old male with medical history significant for HIV with intermittent ART compliance, ESRD on HD, recent intussusception requiring ileocolic resection 19/75 with subsequent lower GI bleed and hospitalization and transfusion who presents for evaluation of a 2 day history of BRBPR and generalized abdominal pain. History of this once before shortly after surgery for intussusception however resolved without intervention. Also reports associated nausea and NBNB vomiting "many times." Endorses SOB as well. Denied any chest pain, fevers, chills, diarrhea, sick contacts or recent illness. Reports compliance with dialysis sessions.  In the ED, VS showed a temp of 99*, pulse 106, BP 192/125 (later 163/93), respirations 20 and was saturating well on room air. CMET significant for hyperkalemia at 5.5 and Cr consistent with ESRD. CBC significant for chronic pancytopenia however acutely worsened anemia with Hb at 6.4. Had one grossly bloody BM in ED. +FOBT. KUB negative for acute process. He was transfused 1 unit of blood and subsequently admitted for evaluation.   Meds:  Current Meds  Medication Sig  . abacavir (ZIAGEN) 300 MG tablet Take 2 tablets (600 mg total) by mouth daily with supper.  [name] acetaminophen (TYLENOL) 500 MG tablet Take 1,000 mg by mouth every 6 (six) hours as needed (pain).  [name] albuterol (PROVENTIL HFA;VENTOLIN HFA)  108 (90 Base) MCG/ACT inhaler Inhale 2 puffs into the lungs every 6 (six) hours as needed for wheezing or shortness of breath.  . Ascorbic Acid (VITAMIN C) 250 MG CHEW Chew 250 mg by mouth daily.  [name] azithromycin (ZITHROMAX) 600 MG tablet Take 2 tablets (1,200 mg total) by mouth once a week. On Mondays  . CARTIA XT 180 MG 24 hr capsule Take 180 mg by mouth daily.  . carvedilol (COREG) 25 MG tablet Take 25 mg by mouth 2 (two) times daily.  . Cyanocobalamin 2500 MCG CHEW Chew 5,000 mcg by mouth daily.  . dapsone 100 MG tablet Take 1 tablet (100 mg total) by mouth daily.  . darunavir-cobicistat (PREZCOBIX) 800-150 MG tablet Take 1 tablet by mouth every evening. Swallow whole. Do NOT crush, break or chew tablets. Take with food.  . dolutegravir (TIVICAY) 50 MG tablet Take 1 tablet (50 mg total) by mouth every evening.  . hydrALAZINE (APRESOLINE) 100 MG tablet Take 0.5 tablets (50 mg total) by mouth 2 (two) times daily. 6:30am, 2:30pm, 11pm - for high blood pressure (Patient taking differently: Take 100 mg by mouth 3 (three) times daily. )  . HYDROcodone-acetaminophen (NORCO/VICODIN) 5-325 MG tablet Take 1-2 tablets by mouth every 6 (six) hours as needed for moderate pain or severe pain.  [name] levETIRAcetam (KEPPRA) 250 MG tablet Take 1 tablet (250 mg) by mouth on Tuesday, Thursday, Saturday at 7pm (Patient taking differently: Take 250 mg by mouth See  admin instructions. Take 1 tablet (250 mg) by mouth on Tuesday, Thursday, Saturday at 7pm)  . levETIRAcetam (KEPPRA) 500 MG tablet Take 1 tablet (500 mg) by mouth Sunday, Monday, Wednesday, Friday at 7pm (non-dialysis days)  . losartan (COZAAR) 100 MG tablet Take 100 mg by mouth at bedtime.   Allergies: Allergies as of 03/15/2016 - Review Complete 03/15/2016  Allergen Reaction Noted  . Bactrim [sulfamethoxazole-trimethoprim] Other (See Comments) 11/14/2013  . Vancomycin Other (See Comments) 12/04/2013  . Reglan [metoclopramide] Other (See Comments)  11/21/2015  . Amlodipine Palpitations and Other (See Comments) 01/12/2015  . Augmentin [amoxicillin-pot clavulanate] Hives 09/03/2013   Past Medical History:  Diagnosis Date  . Asthma   . CAP (community acquired pneumonia) 10/2013  . Cellulitis 11/2013   LLE  . DVT (deep venous thrombosis) ([place]) 10/2013   BLE  . ESRD on hemodialysis ([place]) 01/2015   biopsy proven FSGS with ATN on renal bx from Sept 2015. Went on HD Dec 2016.   [name] Fever   . Headache   . HIV infection ([place]) dx'd 10/2013  . Hypertension   . Leg pain   . Multiple environmental allergies    "trees, dogs, cats"  . Multiple food allergies   . Shingles < 2010  . 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 MGM with diabetes and renal disease. SHx: Has never smoked. Denied any recent alcohol or drug use.  Review of Systems: A complete ROS was negative except as per HPI.   Physical Exam: Blood pressure (!) 162/113, pulse 99, temperature 99 F (37.2 C), temperature source Oral, resp. rate 20, height 5\' 7"  (1.702 m), weight 150 lb (68 kg), SpO2 95 %. General: Chronically-ill appearing african [place] male. Sitting upright on edge of bed with head slouched. Avoids eye contact. Speaks softly.  HENT: No conjunctival injection, icterus or ptosis. Oropharynx clear, mucous membranes moist.  Cardiovascular: Tachycardic but otherwise regular. No murmur or rub appreciated. Pulmonary: Faint bibasilar crackles appreciated. Otherwise clear. Breathing unlabored. Abdomen: +bowel sounds. Refused remainder of abdominal exam. Extremities: No peripheral edema noted BL. AVF present left arm with palpable thrill and audible bruit. No surrounding erythema.  Skin: Warm, dry. No cyanosis.  Psych: Guarded and closed to interview. Agitated with questions and physical exam. Frustrated.  EKG: Sinus tachycardia. Prolonged QT (497)  KUB: No obstruction or free air. Post-op changes in right mid-abdomen  Assessment & Plan  by Problem: Principal Problem:   GI bleed Active Problems:   Human immunodeficiency virus (HIV) disease (H[facility])   Anemia due to end stage renal disease ([facility])   ESRD (end stage renal disease) ([facility])   Seizure disorder (H[facility])  GI Bleed, Acute on Chronic Anemia Admitted 10/31-11/13 with acute ileocecal intussusception s/p ileocecectomy complicated by post-op bleeding, hypotension and anemia secondary to bleeding from incision requiring transfusion of 2 units PRB. He was subsequently admitted 11/12-11/14 with BRBPR and progressive fatigue. He required 2 units PRB during stay and was seen by general surgery who attributed bleed to anastomosis sloughing given the timeframe of presentation. His bleeding spontaneously resolved by the time of discharge and had been without rectal bleeding since that discharge.  Presenting currently with 2 day history of large volume bloody bowel movements with associated diffuse abdominal pain non-bloody emesis.  -Transfusing 1 unit PRBs, f/u post transfusion H&H -Morphine for pain -GI consulted -Consider CT abdomen if syx progress -Coags -Tele -Haptoglobin, LDH  ESRD on HD, TTSat: Secondary to FSGS, confirmed with biopsy in 2015. Began Dialysis in  2016. Reports compliance with HD sessions recently. Case discussed with nephro who will dialyze tomorrow.  -HD tomorrow per nephro  HTN: Hypertensive. Continue Hydral 50 mg BID, Coreg 25 mg BID, Losartan 100mg .  HIV Infection, AIDS: Reports recent compliance with antiretroviral's, Dapsone and Azithromycin. Most recent viral load 455,000 01/2016 with CD4 count 30.  -Continue Ziagen, Prezcobix, Tivicay -Continue Dapsone -Azithromycin every Monday -Recheck CD4 count  Seizure Disorder: Continue Keppra per renal dosing  IVF: None, normal saline lock Diet: renal with fluid restriction <1241mL DVT prophylaxis: SCDs Code status: FULL  Dispo: Admit patient to Inpatient with expected length of stay greater than 2  midnights.  SignedEinar Gip, DO 03/15/2016, 2:43 PM  Pager: [phone]

## 2016-03-15 NOTE — ED Notes (Signed)
ED Provider at bedside. 

## 2016-03-15 NOTE — ED Notes (Signed)
Admitting providers at bedside

## 2016-03-16 ENCOUNTER — Inpatient Hospital Stay (HOSPITAL_COMMUNITY): Payer: Medicare Other

## 2016-03-16 DIAGNOSIS — G40909 Epilepsy, unspecified, not intractable, without status epilepticus: Secondary | ICD-10-CM

## 2016-03-16 LAB — COMPREHENSIVE METABOLIC PANEL
ALT: 11 U/L — ABNORMAL LOW (ref 17–63)
AST: 30 U/L (ref 15–41)
Albumin: 3.1 g/dL — ABNORMAL LOW (ref 3.5–5.0)
Alkaline Phosphatase: 48 U/L (ref 38–126)
Anion gap: 18 — ABNORMAL HIGH (ref 5–15)
BUN: 108 mg/dL — ABNORMAL HIGH (ref 6–20)
CO2: 25 mmol/L (ref 22–32)
Calcium: 8.5 mg/dL — ABNORMAL LOW (ref 8.9–10.3)
Chloride: 93 mmol/L — ABNORMAL LOW (ref 101–111)
Creatinine, Ser: 17.48 mg/dL — ABNORMAL HIGH (ref 0.61–1.24)
GFR calc Af Amer: 4 mL/min — ABNORMAL LOW (ref >60)
GFR calc non Af Amer: 3 mL/min — ABNORMAL LOW (ref >60)
Glucose, Bld: 94 mg/dL (ref 65–99)
Potassium: 6.4 mmol/L (ref 3.5–5.1)
Sodium: 136 mmol/L (ref 135–145)
Total Bilirubin: 0.8 mg/dL (ref 0.3–1.2)
Total Protein: 7.9 g/dL (ref 6.5–8.1)

## 2016-03-16 LAB — CBC
HCT: 23.4 % — ABNORMAL LOW (ref 39.0–52.0)
HCT: 25.5 % — ABNORMAL LOW (ref 39.0–52.0)
Hemoglobin: 7.5 g/dL — ABNORMAL LOW (ref 13.0–17.0)
Hemoglobin: 8.4 g/dL — ABNORMAL LOW (ref 13.0–17.0)
MCH: 30.5 pg (ref 26.0–34.0)
MCH: 30.8 pg (ref 26.0–34.0)
MCHC: 32.1 g/dL (ref 30.0–36.0)
MCHC: 32.9 g/dL (ref 30.0–36.0)
MCV: 95.1 fL (ref 78.0–100.0)
MCV: 93.4 fL (ref 78.0–100.0)
Platelets: 144 10*3/uL — ABNORMAL LOW (ref 150–400)
Platelets: 110 10*3/uL — ABNORMAL LOW (ref 150–400)
RBC: 2.46 MIL/uL — ABNORMAL LOW (ref 4.22–5.81)
RBC: 2.73 MIL/uL — ABNORMAL LOW (ref 4.22–5.81)
RDW: 19.4 % — ABNORMAL HIGH (ref 11.5–15.5)
RDW: 19.4 % — ABNORMAL HIGH (ref 11.5–15.5)
WBC: 4.1 10*3/uL (ref 4.0–10.5)
WBC: 3.8 10*3/uL — ABNORMAL LOW (ref 4.0–10.5)

## 2016-03-16 LAB — GLUCOSE, CAPILLARY
Glucose-Capillary: 76 mg/dL (ref 65–99)
Glucose-Capillary: 100 mg/dL — ABNORMAL HIGH (ref 65–99)

## 2016-03-16 LAB — T-HELPER CELLS (CD4) COUNT (NOT AT ARMC)
CD4 % Helper T Cell: 4 % — ABNORMAL LOW (ref 33–55)
CD4 T Cell Abs: 20 /uL — ABNORMAL LOW (ref 400–2700)

## 2016-03-16 LAB — PREPARE RBC (CROSSMATCH)

## 2016-03-16 LAB — HEPATITIS B SURFACE ANTIGEN: Hepatitis B Surface Ag: NEGATIVE

## 2016-03-16 MED ORDER — AZITHROMYCIN 600 MG PO TABS
1200.0000 mg | ORAL_TABLET | ORAL | Status: DC
  Administered 2016-03-20: 1200 mg via ORAL
  Filled 2016-03-16: qty 2

## 2016-03-16 MED ORDER — DARBEPOETIN ALFA 200 MCG/0.4ML IJ SOSY
200.0000 ug | PREFILLED_SYRINGE | INTRAMUSCULAR | Status: DC
  Administered 2016-03-16: 200 ug via INTRAVENOUS

## 2016-03-16 MED ORDER — DOCUSATE SODIUM 100 MG PO CAPS: 100.0000 mg | ORAL_CAPSULE | Freq: Every day | ORAL | Status: DC | PRN

## 2016-03-16 MED ORDER — LORAZEPAM 2 MG/ML IJ SOLN
INTRAMUSCULAR | Status: AC
  Administered 2016-03-16: 1 mg via INTRAVENOUS
  Filled 2016-03-16: qty 1

## 2016-03-16 MED ORDER — SODIUM CHLORIDE 0.9 % IV SOLN
750.0000 mg | INTRAVENOUS | Status: AC
  Administered 2016-03-16: 750 mg via INTRAVENOUS
  Filled 2016-03-16: qty 7.5

## 2016-03-16 MED ORDER — SODIUM CHLORIDE 0.9 % IV SOLN
1000.0000 mg | INTRAVENOUS | Status: DC
  Filled 2016-03-16: qty 10

## 2016-03-16 MED ORDER — DOXERCALCIFEROL 4 MCG/2ML IV SOLN
INTRAVENOUS | Status: AC
  Administered 2016-03-16: 4 ug via INTRAVENOUS
  Filled 2016-03-16: qty 2

## 2016-03-16 MED ORDER — DARBEPOETIN ALFA 200 MCG/0.4ML IJ SOSY
PREFILLED_SYRINGE | INTRAMUSCULAR | Status: AC
  Administered 2016-03-16: 200 ug via INTRAVENOUS
  Filled 2016-03-16: qty 0.4

## 2016-03-16 MED ORDER — LEVETIRACETAM 250 MG PO TABS
250.0000 mg | ORAL_TABLET | ORAL | Status: DC
  Administered 2016-03-16: 250 mg via ORAL
  Filled 2016-03-16: qty 1

## 2016-03-16 MED ORDER — LEVETIRACETAM 250 MG PO TABS: 250.0000 mg | ORAL_TABLET | Freq: Once | ORAL | Status: DC

## 2016-03-16 MED ORDER — SODIUM CHLORIDE 0.9 % IV SOLN: Freq: Once | INTRAVENOUS | Status: DC

## 2016-03-16 MED ORDER — DOXERCALCIFEROL 4 MCG/2ML IV SOLN
4.0000 ug | INTRAVENOUS | Status: DC
  Administered 2016-03-16 – 2016-03-21 (×3): 4 ug via INTRAVENOUS
  Filled 2016-03-16 (×3): qty 2

## 2016-03-16 MED ORDER — LEVETIRACETAM 500 MG PO TABS
500.0000 mg | ORAL_TABLET | Freq: Every day | ORAL | Status: DC
  Administered 2016-03-17: 500 mg via ORAL
  Filled 2016-03-16 (×2): qty 1

## 2016-03-16 MED ORDER — SODIUM CHLORIDE 0.9 % IV SOLN
125.0000 mg | INTRAVENOUS | Status: DC
  Administered 2016-03-16: 125 mg via INTRAVENOUS
  Filled 2016-03-16 (×2): qty 10

## 2016-03-16 MED ORDER — LORAZEPAM 2 MG/ML IJ SOLN
1.0000 mg | Freq: Once | INTRAMUSCULAR | Status: AC
  Administered 2016-03-16: 1 mg via INTRAVENOUS

## 2016-03-16 NOTE — Consult Note (Signed)
Reason for Consult: Hematochezia Referring Physician: [facility]  [name] HPI: This is a 36 year old male with a PMH of HIV, ESRD and s/p ileocolonic intussusception resection 11/2015 admitted with complaints of progressive hematochezia and abdominal pain.  The patient reports multiple episodes of hematochezia and his HGB was at 6.4 g/dL on admission.  Further evaluation with a CT scan of the abdomen was suggestive for an inflammatory component to the right side of the colon.  There was also a chronic mesenteric edema noted, which appears to be stable.   Past Medical History:  Diagnosis Date  . Asthma   . CAP (community acquired pneumonia) 10/2013  . Cellulitis 11/2013   LLE  . DVT (deep venous thrombosis) ([place]) 10/2013   BLE  . 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 Went on HD Dec 2016.   [name] Fever   . Headache   . HIV infection ([place]) dx'd 10/2013  . Hypertension   . Leg pain   . Multiple environmental allergies    "trees, dogs, cats"  . Multiple food allergies   . Shingles < 2010  . 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 AV FISTULA PLACEMENT Left 03/03/2015   Procedure: Creation of Left Arm RADIOCEPHALIC ARTERIOVENOUS FISTULA ;  Surgeon: Mal Misty, MD;  Location: [place];  Service: Vascular;  Laterality: Left;  . FEMUR FRACTURE SURGERY Right 09/2001  . FRACTURE SURGERY    . I&D EXTREMITY Right 12/18/2014   Procedure: IRRIGATION AND DEBRIDEMENT RIGHT LOWER EXTREMITY;  Surgeon: Ralene Ok, MD;  Location: [place];  Service: General;  Laterality: Right;  . INSERTION OF DIALYSIS CATHETER  2016  . LAPAROTOMY N/A 12/28/2015   Procedure: EXPLORATORY LAPAROTOMY BOWEL RESECTION ILEOCECECTOMY;  Surgeon: Clovis Riley, MD;  Location: [place];  Service: General;  Laterality: N/A;    Family History  Problem Relation Age of Onset  . Hypertension Mother   .  Diabetes Maternal Grandmother   . Kidney disease Maternal Grandmother     Social History:  reports that he has never smoked. He has never used smokeless tobacco. He reports that he does not drink alcohol or use drugs.  Allergies:  Allergies  Allergen Reactions  . Bactrim [Sulfamethoxazole-Trimethoprim] Other (See Comments)    Renal failure  . Vancomycin Other (See Comments)    Patient states vancomycin caused kidney injury  . Reglan [Metoclopramide] Other (See Comments)    Causes ticks/ jerks  . Amlodipine Palpitations and Other (See Comments)    fatigue  . Augmentin [Amoxicillin-Pot Clavulanate] Hives    Medications:  Scheduled: . sodium chloride   Intravenous Once  . abacavir  600 mg Oral Q supper  . carvedilol  25 mg Oral BID  . dapsone  100 mg Oral Daily  . darunavir-cobicistat  1 tablet Oral QPM  . diltiazem  180 mg Oral Daily  . dolutegravir  50 mg Oral QPM  . hydrALAZINE  50 mg Oral TID  . levETIRAcetam  250 mg Oral Once per day on Tue Thu Sat  . [START ON 03/17/2016] levETIRAcetam  500 mg Oral QODAY  . losartan  100 mg Oral QHS  . sodium chloride flush  3 mL Intravenous Q12H   Continuous:   Results for orders placed or performed during the hospital encounter of 03/15/16 (from the past 24 hour(s))  Comprehensive metabolic panel  Status: Abnormal   Collection Time: 03/15/16 10:52 AM  Result Value Ref Range   Sodium 135 135 - 145 mmol/L   Potassium 5.5 (H) 3.5 - 5.1 mmol/L   Chloride 94 (L) 101 - 111 mmol/L   CO2 23 22 - 32 mmol/L   Glucose, Bld 84 65 - 99 mg/dL   BUN 95 (H) 6 - 20 mg/dL   Creatinine, Ser 15.58 (H) 0.61 - 1.24 mg/dL   Calcium 8.1 (L) 8.9 - 10.3 mg/dL   Total Protein 7.2 6.5 - 8.1 g/dL   Albumin 2.8 (L) 3.5 - 5.0 g/dL   AST 30 15 - 41 U/L   ALT 12 (L) 17 - 63 U/L   Alkaline Phosphatase 44 38 - 126 U/L   Total Bilirubin 0.7 0.3 - 1.2 mg/dL   GFR calc non Af Amer 4 (L) >60 mL/min   GFR calc Af Amer 4 (L) >60 mL/min   Anion gap 18 (H) 5 -  15  CBC with Differential     Status: Abnormal   Collection Time: 03/15/16 10:52 AM  Result Value Ref Range   WBC 3.9 (L) 4.0 - 10.5 K/uL   RBC 2.08 (L) 4.22 - 5.81 MIL/uL   Hemoglobin 6.4 (LL) 13.0 - 17.0 g/dL   HCT 19.8 (L) 39.0 - 52.0 %   MCV 95.2 78.0 - 100.0 fL   MCH 30.8 26.0 - 34.0 pg   MCHC 32.3 30.0 - 36.0 g/dL   RDW 18.6 (H) 11.5 - 15.5 %   Platelets 102 (L) 150 - 400 K/uL   Neutrophils Relative % 75 %   Lymphocytes Relative 16 %   Monocytes Relative 8 %   Eosinophils Relative 1 %   Basophils Relative 0 %   Neutro Abs 3.0 1.7 - 7.7 K/uL   Lymphs Abs 0.6 (L) 0.7 - 4.0 K/uL   Monocytes Absolute 0.3 0.1 - 1.0 K/uL   Eosinophils Absolute 0.0 0.0 - 0.7 K/uL   Basophils Absolute 0.0 0.0 - 0.1 K/uL   RBC Morphology TEARDROP CELLS   Type and screen [place]     Status: None (Preliminary result)   Collection Time: 03/15/16 10:52 AM  Result Value Ref Range   ABO/RH(D) O POS    Antibody Screen NEG    Sample Expiration 03/18/2016    Unit Number [id number]    Blood Component Type RED CELLS,LR    Unit division 00    Status of Unit ALLOCATED    Transfusion Status OK TO TRANSFUSE    Crossmatch Result Compatible    Unit Number [id number]    Blood Component Type RED CELLS,LR    Unit division 00    Status of Unit ISSUED,FINAL    Transfusion Status OK TO TRANSFUSE    Crossmatch Result Compatible    Unit Number [id number]    Blood Component Type RED CELLS,LR    Unit division 00    Status of Unit ALLOCATED    Transfusion Status OK TO TRANSFUSE    Crossmatch Result Compatible   POC occult blood, ED Provider will collect     Status: Abnormal   Collection Time: 03/15/16 11:16 AM  Result Value Ref Range   Fecal Occult Bld POSITIVE (A) NEGATIVE  Prepare RBC     Status: None   Collection Time: 03/15/16 12:40 PM  Result Value Ref Range   Order Confirmation ORDER PROCESSED BY BLOOD BANK   Prepare RBC     Status: None   Collection Time:  03/15/16  6:39  PM  Result Value Ref Range   Order Confirmation BB SAMPLE OR UNITS ALREADY AVAILABLE   CBC     Status: Abnormal   Collection Time: 03/15/16  9:36 PM  Result Value Ref Range   WBC 4.2 4.0 - 10.5 K/uL   RBC 2.41 (L) 4.22 - 5.81 MIL/uL   Hemoglobin 7.3 (L) 13.0 - 17.0 g/dL   HCT 22.7 (L) 39.0 - 52.0 %   MCV 94.2 78.0 - 100.0 fL   MCH 30.3 26.0 - 34.0 pg   MCHC 32.2 30.0 - 36.0 g/dL   RDW 19.2 (H) 11.5 - 15.5 %   Platelets 113 (L) 150 - 400 K/uL  Lactate dehydrogenase     Status: Abnormal   Collection Time: 03/15/16  9:36 PM  Result Value Ref Range   LDH 379 (H) 98 - 192 U/L  Comprehensive metabolic panel     Status: Abnormal   Collection Time: 03/16/16  6:37 AM  Result Value Ref Range   Sodium 136 135 - 145 mmol/L   Potassium 6.4 (HH) 3.5 - 5.1 mmol/L   Chloride 93 (L) 101 - 111 mmol/L   CO2 25 22 - 32 mmol/L   Glucose, Bld 94 65 - 99 mg/dL   BUN 108 (H) 6 - 20 mg/dL   Creatinine, Ser 17.48 (H) 0.61 - 1.24 mg/dL   Calcium 8.5 (L) 8.9 - 10.3 mg/dL   Total Protein 7.9 6.5 - 8.1 g/dL   Albumin 3.1 (L) 3.5 - 5.0 g/dL   AST 30 15 - 41 U/L   ALT 11 (L) 17 - 63 U/L   Alkaline Phosphatase 48 38 - 126 U/L   Total Bilirubin 0.8 0.3 - 1.2 mg/dL   GFR calc non Af Amer 3 (L) >60 mL/min   GFR calc Af Amer 4 (L) >60 mL/min   Anion gap 18 (H) 5 - 15  Glucose, capillary     Status: None   Collection Time: 03/16/16  8:06 AM  Result Value Ref Range   Glucose-Capillary 76 65 - 99 mg/dL     Ct Abdomen Pelvis Wo Contrast  Result Date: 03/16/2016 CLINICAL DATA:  Hematochezia. EXAM: CT ABDOMEN AND PELVIS WITHOUT CONTRAST TECHNIQUE: Multidetector CT imaging of the abdomen and pelvis was performed following the standard protocol without IV contrast. COMPARISON:  02/01/2016 FINDINGS: Lower chest: Unchanged cardiomegaly. There is a small pericardial effusion measuring heterogeneous density, unchanged in size and appearance from prior exam. Trace left pleural thickening/fluid. Minimal left lower lobe  atelectasis. Hepatobiliary: No evidence for focal lesion allowing for lack contrast. Gallbladder is physiologically distended. Probable gallbladder wall thickening. No calcified stone. Pancreas: Not well-defined given lack contrast and mesenteric edema. No evidence of ductal dilatation or inflammation. Spleen: Borderline splenomegaly again seen, spleen measures 12.3 cm. No gross focal in Adrenals/Urinary Tract: No evidence of adrenal nodule. Mild thinning of renal parenchyma without hydronephrosis. Patient with end-stage renal disease on dialysis. Urinary bladder is minimally distended Stomach/Bowel: Suboptimally defined given lack of enteric contrast and mesenteric edema. Stomach is physiologically distended. Enteric suture line in the right lower quadrant, patient is post ileus seek ectomy. Contrast within a few scattered colonic diverticula. There is probable wall thickening involving the transverse colon, and possibly descending and sigmoid colon to a lesser extent. No bowel dilatation to suggest obstruction. Vascular/Lymphatic: IVC appears prominent. Edema of the intra-abdominal fat limits assessment for retroperitoneal and abdominal adenopathy. Prominent inguinal nodes are unchanged from prior. Reproductive: Prostate is unremarkable. Soft tissue  density in the left inguinal canal may be inguinal testis or complex fluid. Other: Diffuse infiltration of intra-abdominal and subcutaneous fat. Probable minimal free fluid in the abdomen and pelvis. No evidence of free air. Musculoskeletal: There are no acute or suspicious osseous abnormalities. IMPRESSION: 1. Probable colonic wall thickening, suggesting colitis which may be infectious or inflammatory. Reduce sensitivity given lack of contrast, paucity of intra- abdominal fat and mesenteric edema. 2. Mesenteric and body wall edema, chronic. 3. Small pericardial effusion. 4. Soft tissue density in the left inguinal canal may be heterogeneous fluid or inguinal testis.  Recommend correlation with physical exam. 5. Question of gallbladder wall thickening. If there is concern for hepatobiliary pathology, right upper quadrant ultrasound recommended. Electronically Signed   By: Jeb Levering M.D.   On: 03/16/2016 01:06   Dg Abd 1 View  Result Date: 03/15/2016 CLINICAL DATA:  Abdominal pain EXAM: ABDOMEN - 1 VIEW COMPARISON:  CT abdomen and pelvis February 01, 2016 FINDINGS: The there is moderate stool in the colon. There is postoperative change in the right mid abdomen. There is no bowel dilatation or air-fluid level suggesting bowel obstruction. No free air. There are small phleboliths in the pelvis. IMPRESSION: No bowel obstruction or free air. Postoperative change right mid abdomen. Electronically Signed   By: Lowella Grip III M.D.   On: 03/15/2016 11:53    ROS:  As stated above in the HPI otherwise negative.  Blood pressure (!) 154/87, pulse 66, temperature 98.2 F (36.8 C), temperature source Oral, resp. rate 19, height 5\' 7"  (1.702 m), weight 68.7 kg (151 lb 6.4 oz), SpO2 98 %.    PE: Gen: NAD, Alert and Oriented HEENT:  [place]/AT, EOMI Neck: Supple, no LAD Lungs: CTA Bilaterally CV: RRR without M/G/R ABM: he did not want to be touch in the abdomen. Ext: No C/C/E  Assessment/Plan: 1) Hematochezia. 2) ? Right sided colitis. 3) Anemia. 4) ESRD - TTS   He has a wide range for his baseline anemia, 7-12 over the past year, but over the last 4 months it was from 7-8.  He denies any further hematochezia since his admission.  I believe he requires a colonoscopy, but he does not want to make any decision at this time.  He states that he did not sleep well and he is too sleepy to make a decision at this time.  Plan: 1) I will reassess tomorrow, but his colonoscopy will not be performed until Saturday.  Shakesha Soltau D 03/16/2016, 9:00 AM

## 2016-03-16 NOTE — Progress Notes (Signed)
Patient stated, "I'm not going to dialysis until I can eat!" MD notified. Molt, MD stated that she would put in for a diet order. Will continue to monitor.

## 2016-03-16 NOTE — Consult Note (Signed)
[facility] Renal Consultation Note  Indication for Consultation:  Management of ESRD/hemodialysis; anemia, hypertension/volume and secondary hyperparathyroidism  HPI: Jim Wyatt is a 35 y.o. malewith ESRD sec HIV FSGS,  (HD start 01/2015 now at [facility])  HIV on ARVs-Dr. Snider,HTN,10/31- 01/04/16  Ileocolic /intussusception/Infarcted apendix >Ileocectomy, some problems with compliance . He is now admitted with nausea and vomiting, BRBPR for about 2 days and TNTC bowel movements with blood and sharp intermittent abdominal pain. In the ER was FOBT + ,  Hgb = 6.4. ( last op h[facility] 8.3 on [facility]09/16 [facility]) and K 5.5 in ER  . He denied dizziness, fevers, chills. He missed his last HD  tx Tues 03/13/16  sec to symptoms . Noted in  recent past he has refused ESA and FE at op [facility],agreeed started back 02/26/16. Currently he is  sitting at bottom of bed  with minimal abdominal discomfort reported. Noted K now 6.4 and hgb 7.4 after one unit PRBC's transfused yesterday.     Past Medical History:  Diagnosis Date  . Asthma   . CAP (community acquired pneumonia) 10/2013  . Cellulitis 11/2013   LLE  . DVT (deep venous thrombosis) (HCC) 10/2013   BLE  . ESRD on hemodialysis (HCC) 01/2015   biopsy proven FSGS with ATN on renal bx from Sept 2015. Went on HD Dec 2016.   . Fever   . Headache   . HIV infection (HCC) dx'd 10/2013  . Hypertension   . Leg pain   . Multiple environmental allergies    "trees, dogs, cats"  . Multiple food allergies   . Shingles < 2010  . 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 AV FISTULA PLACEMENT Left 03/03/2015   Procedure: Creation of Left Arm RADIOCEPHALIC ARTERIOVENOUS FISTULA ;  Surgeon: James D Lawson, MD;  Location: [facility] OR;  Service: Vascular;  Laterality: Left;  . FEMUR FRACTURE SURGERY Right 09/2001  . FRACTURE SURGERY    . I&D EXTREMITY Right 12/18/2014   Procedure:  IRRIGATION AND DEBRIDEMENT RIGHT LOWER EXTREMITY;  Surgeon: Armando Ramirez, MD;  Location: [facility] OR;  Service: General;  Laterality: Right;  . INSERTION OF DIALYSIS CATHETER  2016  . LAPAROTOMY N/A 12/28/2015   Procedure: EXPLORATORY LAPAROTOMY BOWEL RESECTION ILEOCECECTOMY;  Surgeon: Chelsea A Connor, MD;  Location: [facility] OR;  Service: General;  Laterality: N/A;      Family History  Problem Relation Age of Onset  . Hypertension Mother   . Diabetes Maternal Grandmother   . Kidney disease Maternal Grandmother       reports that he has never smoked. He has never used smokeless tobacco. He reports that he does not drink alcohol or use drugs.   Allergies  Allergen Reactions  . Bactrim [Sulfamethoxazole-Trimethoprim] Other (See Comments)    Renal failure  . Vancomycin Other (See Comments)    Patient states vancomycin caused kidney injury  . Reglan [Metoclopramide] Other (See Comments)    Causes ticks/ jerks  . Amlodipine Palpitations and Other (See Comments)    fatigue  . Augmentin [Amoxicillin-Pot Clavulanate] Hives    Prior to Admission medications   Medication Sig Start Date End Date Taking? Authorizing Provider  abacavir (ZIAGEN) 300 MG tablet Take 2 tablets (600 mg total) by mouth daily with supper. 12/14/15  Yes Cynthia Snider, MD  acetaminophen (TYLENOL) 500 MG tablet Take 1,000 mg by mouth every 6 (six) hours as needed (pain).     Yes Historical Provider, MD  albuterol (PROVENTIL HFA;VENTOLIN HFA) 108 (90 Base) MCG/ACT inhaler Inhale 2 puffs into the lungs every 6 (six) hours as needed for wheezing or shortness of breath. 01/04/16  Yes Adam Johnson, MD  Ascorbic Acid (VITAMIN C) 250 MG CHEW Chew 250 mg by mouth daily.   Yes Historical Provider, MD  azithromycin (ZITHROMAX) 600 MG tablet Take 2 tablets (1,200 mg total) by mouth once a week. On Mondays 01/11/16  Yes Adam Johnson, MD  CARTIA XT 180 MG 24 hr capsule Take 180 mg by mouth daily. 12/11/15  Yes Historical Provider, MD   carvedilol (COREG) 25 MG tablet Take 25 mg by mouth 2 (two) times daily. 12/02/15  Yes Historical Provider, MD  Cyanocobalamin 2500 MCG CHEW Chew 5,000 mcg by mouth daily.   Yes Historical Provider, MD  dapsone 100 MG tablet Take 1 tablet (100 mg total) by mouth daily. 11/24/15  Yes Alexa R Burns, MD  darunavir-cobicistat (PREZCOBIX) 800-150 MG tablet Take 1 tablet by mouth every evening. Swallow whole. Do NOT crush, break or chew tablets. Take with food. 12/14/15  Yes Cynthia Snider, MD  dolutegravir (TIVICAY) 50 MG tablet Take 1 tablet (50 mg total) by mouth every evening. 12/14/15  Yes Cynthia Snider, MD  hydrALAZINE (APRESOLINE) 100 MG tablet Take 0.5 tablets (50 mg total) by mouth 2 (two) times daily. 6:30am, 2:30pm, 11pm - for high blood pressure Patient taking differently: Take 100 mg by mouth 3 (three) times daily.  01/11/16  Yes Adam Johnson, MD  HYDROcodone-acetaminophen (NORCO/VICODIN) 5-325 MG tablet Take 1-2 tablets by mouth every 6 (six) hours as needed for moderate pain or severe pain. 01/04/16  Yes Adam Johnson, MD  levETIRAcetam (KEPPRA) 250 MG tablet Take 1 tablet (250 mg) by mouth on Tuesday, Thursday, Saturday at 7pm Patient taking differently: Take 250 mg by mouth See admin instructions. Take 1 tablet (250 mg) by mouth on Tuesday, Thursday, Saturday at 7pm 03/13/16  Yes Karen M Aquino, MD  levETIRAcetam (KEPPRA) 500 MG tablet Take 1 tablet (500 mg) by mouth Sunday, Monday, Wednesday, Friday at 7pm (non-dialysis days) 03/13/16  Yes Karen M Aquino, MD  losartan (COZAAR) 100 MG tablet Take 100 mg by mouth at bedtime.   Yes Historical Provider, MD  ondansetron (ZOFRAN ODT) 8 MG disintegrating tablet Take 1 tablet (8 mg total) by mouth every 8 (eight) hours as needed for nausea or vomiting. Patient not taking: Reported on 03/15/2016 10/06/15   Cynthia Snider, MD  sucroferric oxyhydroxide (VELPHORO) 500 MG chewable tablet Chew 500 mg by mouth See admin instructions. Chew 1 tablet (500 mg) by  mouth after every meal (2-3 times daily)    Historical Provider, MD    PRN:acetaminophen **OR** acetaminophen, docusate sodium, HYDROcodone-acetaminophen, ondansetron **OR** ondansetron (ZOFRAN) IV, senna-docusate  Results for orders placed or performed during the hospital encounter of 03/15/16 (from the past 48 hour(s))  Comprehensive metabolic panel     Status: Abnormal   Collection Time: 03/15/16 10:52 AM  Result Value Ref Range   Sodium 135 135 - 145 mmol/L   Potassium 5.5 (H) 3.5 - 5.1 mmol/L   Chloride 94 (L) 101 - 111 mmol/L   CO2 23 22 - 32 mmol/L   Glucose, Bld 84 65 - 99 mg/dL   BUN 95 (H) 6 - 20 mg/dL   Creatinine, Ser 15.58 (H) 0.61 - 1.24 mg/dL   Calcium 8.1 (L) 8.9 - 10.3 mg/dL   Total Protein 7.2 6.5 - 8.1 g/dL   Albumin 2.8 (  L) 3.5 - 5.0 g/dL   AST 30 15 - 41 U/L   ALT 12 (L) 17 - 63 U/L   Alkaline Phosphatase 44 38 - 126 U/L   Total Bilirubin 0.7 0.3 - 1.2 mg/dL   GFR calc non Af Amer 4 (L) >60 mL/min   GFR calc Af Amer 4 (L) >60 mL/min    Comment: (NOTE) The eGFR has been calculated using the CKD EPI equation. This calculation has not been validated in all clinical situations. eGFR's persistently <60 mL/min signify possible Chronic Kidney Disease.    Anion gap 18 (H) 5 - 15  CBC with Differential     Status: Abnormal   Collection Time: 03/15/16 10:52 AM  Result Value Ref Range   WBC 3.9 (L) 4.0 - 10.5 K/uL   RBC 2.08 (L) 4.22 - 5.81 MIL/uL   Hemoglobin 6.4 (LL) 13.0 - 17.0 g/dL    Comment: REPEATED TO VERIFY CRITICAL RESULT CALLED TO, READ BACK BY AND VERIFIED WITH: KELLY MOON,RN AT 1223 ON 01.17.18 BY S. YARBROUGH    HCT 19.8 (L) 39.0 - 52.0 %   MCV 95.2 78.0 - 100.0 fL   MCH 30.8 26.0 - 34.0 pg   MCHC 32.3 30.0 - 36.0 g/dL   RDW 18.6 (H) 11.5 - 15.5 %   Platelets 102 (L) 150 - 400 K/uL    Comment: REPEATED TO VERIFY PLATELET COUNT CONFIRMED BY SMEAR    Neutrophils Relative % 75 %   Lymphocytes Relative 16 %   Monocytes Relative 8 %    Eosinophils Relative 1 %   Basophils Relative 0 %   Neutro Abs 3.0 1.7 - 7.7 K/uL   Lymphs Abs 0.6 (L) 0.7 - 4.0 K/uL   Monocytes Absolute 0.3 0.1 - 1.0 K/uL   Eosinophils Absolute 0.0 0.0 - 0.7 K/uL   Basophils Absolute 0.0 0.0 - 0.1 K/uL   RBC Morphology TEARDROP CELLS   Type and screen [facility]     Status: None (Preliminary result)   Collection Time: 03/15/16 10:52 AM  Result Value Ref Range   ABO/RH(D) O POS    Antibody Screen NEG    Sample Expiration 03/18/2016    Unit Number [id number]    Blood Component Type RED CELLS,LR    Unit division 00    Status of Unit ALLOCATED    Transfusion Status OK TO TRANSFUSE    Crossmatch Result Compatible    Unit Number [id number]    Blood Component Type RED CELLS,LR    Unit division 00    Status of Unit ISSUED,FINAL    Transfusion Status OK TO TRANSFUSE    Crossmatch Result Compatible    Unit Number [id number]    Blood Component Type RED CELLS,LR    Unit division 00    Status of Unit ALLOCATED    Transfusion Status OK TO TRANSFUSE    Crossmatch Result Compatible   POC occult blood, ED Provider will collect     Status: Abnormal   Collection Time: 03/15/16 11:16 AM  Result Value Ref Range   Fecal Occult Bld POSITIVE (A) NEGATIVE  Prepare RBC     Status: None   Collection Time: 03/15/16 12:40 PM  Result Value Ref Range   Order Confirmation ORDER PROCESSED BY BLOOD BANK   Prepare RBC     Status: None   Collection Time: 03/15/16  6:39 PM  Result Value Ref Range   Order Confirmation BB SAMPLE OR UNITS ALREADY AVAILABLE   CBC       Status: Abnormal   Collection Time: 03/15/16  9:36 PM  Result Value Ref Range   WBC 4.2 4.0 - 10.5 K/uL   RBC 2.41 (L) 4.22 - 5.81 MIL/uL   Hemoglobin 7.3 (L) 13.0 - 17.0 g/dL   HCT 22.7 (L) 39.0 - 52.0 %   MCV 94.2 78.0 - 100.0 fL   MCH 30.3 26.0 - 34.0 pg   MCHC 32.2 30.0 - 36.0 g/dL   RDW 19.2 (H) 11.5 - 15.5 %   Platelets 113 (L) 150 - 400 K/uL    Comment: REPEATED TO  VERIFY SPECIMEN CHECKED FOR CLOTS CONSISTENT WITH PREVIOUS RESULT   Lactate dehydrogenase     Status: Abnormal   Collection Time: 03/15/16  9:36 PM  Result Value Ref Range   LDH 379 (H) 98 - 192 U/L  Comprehensive metabolic panel     Status: Abnormal   Collection Time: 03/16/16  6:37 AM  Result Value Ref Range   Sodium 136 135 - 145 mmol/L   Potassium 6.4 (HH) 3.5 - 5.1 mmol/L    Comment: NO VISIBLE HEMOLYSIS CRITICAL RESULT CALLED TO, READ BACK BY AND VERIFIED WITH: A.MABE,RN 0820 03/16/16 CLARK,S    Chloride 93 (L) 101 - 111 mmol/L   CO2 25 22 - 32 mmol/L   Glucose, Bld 94 65 - 99 mg/dL   BUN 108 (H) 6 - 20 mg/dL   Creatinine, Ser 17.48 (H) 0.61 - 1.24 mg/dL   Calcium 8.5 (L) 8.9 - 10.3 mg/dL   Total Protein 7.9 6.5 - 8.1 g/dL   Albumin 3.1 (L) 3.5 - 5.0 g/dL   AST 30 15 - 41 U/L   ALT 11 (L) 17 - 63 U/L   Alkaline Phosphatase 48 38 - 126 U/L   Total Bilirubin 0.8 0.3 - 1.2 mg/dL   GFR calc non Af Amer 3 (L) >60 mL/min   GFR calc Af Amer 4 (L) >60 mL/min    Comment: (NOTE) The eGFR has been calculated using the CKD EPI equation. This calculation has not been validated in all clinical situations. eGFR's persistently <60 mL/min signify possible Chronic Kidney Disease.    Anion gap 18 (H) 5 - 15  Glucose, capillary     Status: None   Collection Time: 03/16/16  8:06 AM  Result Value Ref Range   Glucose-Capillary 76 65 - 99 mg/dL    ROS: see hpi   Physical Exam: Vitals:   03/15/16 2106 03/16/16 0559  BP: (!) 179/114 (!) 154/87  Pulse: 68 66  Resp: 18 19  Temp: 98.7 F (37.1 C) 98.2 F (36.8 C)     General: alert thin AAM chronically ill appearing NAD, OX3 HEENT: Blunt, MMdry  Eomi, nonicteric  Neck: no jvd , supple Heart: RRR no rub, mur or gallop Lungs: CTA bilat Abdomen: BS pos. Healed midline sur. Scar. Minimally tender epigastric , no rebound  Extremities: NO pedal edema Skin: dry, scattered dry excoriations , no overt trrash   Neuro: Moves all extrem. No  acute focal deficits  Dialysis Access: pos bruit LUA AVF  Dialysis Orders: [facility] . EDW 66 kg HD Bath 2k, 2ca  Time 3hrs 45min Heparin 2,000. Access LUA AVF     Hec4 mcg IV/HD   mircera 225mcg q 2wks  Hd  Last on    Units IV/HD   Other op labs hgb 8.3  03/09/16 ca 9 phos 7.4  pth 942  TFS29%   Assessment/Plan 1. Hyperkalemia= hd today  2. ESRD -    Nl HD = TTS (missed last op hd sec gi bld) 3. Lower GI Bleed= per admit team/ Gi Consulted  /NO HEP on HD  4. Hypertension/volume  - BP up on admit 179/114> 154/87  ,meds (in past ??comlaince Noted bp down with admits and up at [facility]) HD today 2 liter up per wts/ is able to stand this am  In room /keep standngs wt . 5. Anemia  - of esrd and GI Bld-  Sp transfused 1 unit yest . hgb 6.4 to 7.4  this am/  Check  pre hd /iv venofer q thurs .hd , Next ESA due 1/25 max  6. Metabolic bone disease -  Hec  q hd binder when pos 7. HIV -meds per admit  Ernest Haber, PA-C [place] [phone] 03/16/2016, 9:08 AM

## 2016-03-16 NOTE — Significant Event (Signed)
Rapid Response Event Note  Overview: Time Called: 3437 Arrival Time: 1020 Event Type: Neurologic  Initial Focused Assessment: Per RN patient called out that he couldn't see, then she witnessed seizure activity.  Upon my arrival patient confused, mildly agitated wanting to sit on the side of the bed.  Able to follow some commands but not attending very well.   BP 205/105  HR 116  RR 24  O2 sat 97% on 2l Cole  Interventions: MD at bedside. Keppra ordered and given 1mg  Ativan given IV  1100am:  Patient sleeping BP 155/87  HR 106  RR 16  O2 sat 95%  Plan HD this am  Plan of Care (if not transferred): Rn to call if futher seizure activity or if assistance needed  Event Summary: Name of Physician Notified: Dr Danford Bad at 1020    at    Outcome: Stayed in room and stabalized  Event End Time: Jasper  Raliegh Ip

## 2016-03-16 NOTE — Progress Notes (Signed)
03/16/2016 11:05 AM  RN Cardell Peach requested help for this patient. This RN walked into the room and noticed that the patient was having a possible seizure and body was stiff as a board at 1017. We rolled patient over onto his left side and monitored his breathing. This seizure like-activity ended around 1019. We assisted getting the patient straighten in the bed, set up suction and provided the patient with oxygen. Rapid response was notified and at bedside. Teaching service notified, at bedside. New orders written. Mediations given, check eMAR. EKG preformed. Patient stable at this time. Will continue to monitor and assess with attending RN.   Keil Pickering [employer], RN-BC [employer] Phone [phone]

## 2016-03-16 NOTE — Progress Notes (Addendum)
Subjective: Jim Wyatt was seen and evaluated today at  Bedside at around 9 am. He denied any acute events overnight however reports he got barely any sleep and was up all night. Otherwise reports he continues to feel fatigued. Reports continued large volume bloody bowel movements.   UPDATE: 10:23 received page from RN that rapid response was called approx 15-20 minutes prior due to seizure activity. Per RN, patient called out " I cant see." Came in to find patient staring off blankly, then reportedly entire body stiffened and he was clenching his jaw.   Objective:  Vital signs in last 24 hours: Vitals:   03/15/16 1809 03/15/16 2031 03/15/16 2106 03/16/16 0559  BP: (!) 169/100  (!) 179/114 (!) 154/87  Pulse: 94  68 66  Resp: 20  18 19   Temp: 98.5 F (36.9 C)  98.7 F (37.1 C) 98.2 F (36.8 C)  TempSrc: Oral  Oral Oral  SpO2: 96%  91% 98%  Weight: 151 lb 6.4 oz (68.7 kg) 151 lb 6.4 oz (68.7 kg)    Height: 5\' 7"  (1.702 m) 5\' 7"  (1.702 m)     General: Chronically-ill appearing african [place] male. Sitting upright on edge of bed. Head hanging down. In no acute distress. Not open to exam. Fatigued Cardiovascular: Regular rate and rhythm. No murmur or rub appreciated. Pulmonary: CTA BL, no wheezing, crackles or rhonchi appreciated. Unlabored breathing.  Abdomen: +bowel sounds Extremities: AVF in place left forearm. Skin: Warm, dry. No cyanosis.  Psych: Mood dysthymic and affect was mood congruent.  CT abdomen/pelvis: Colonic wall thickening suggesting colitis - infectious vs inflammatory? ?GB wall thickening  Assessment/Plan:  Principal Problem:   GI bleed Active Problems:   Human immunodeficiency virus (HIV) disease (HCC)   Anemia due to end stage renal disease (HCC)   ESRD (end stage renal disease) (HCC)   Seizure disorder (HCC)   Hyperkalemia  GI Bleed, ?Chrons, Anemia Hb 7.5, was 6.4 on admission, s/p transfusion 1 unit. Continues to have bloody bowel movements. Reports  significant fatigue overnight and reports he could not sleep. CT abdomen pelvis with some colonic wall thickening suggesting colitis. LDH chronically significantly elevated. With his history of intussusception secondary to ileocolic inflammation, recurrent GI bleeds, weight loss and chronic diarrhea - question if patient has Chrons disease?  -Seen and evaluated by GI will perform colonoscopy Saturday.  -Haptoglobin pending -Give 1 unit with dialysis today -Aranesp Thursday during dialysis  Seizure Pt reports he takes keppra 500 mg M,W,F, Sun (non-dialysis days) and 250 mg post dialysis (T,Thur,Sat). Did not receive yesterdays 500 mg keppra - unsure if took before presenting for evaluation however did have n/v yesterday as well. Contacted pharmacy post seizure who in the setting of not having yesterdays Keppra and had recent seizure recommended 1000 mg IV Keppra loading followed by 250mg  PO after dialysis.  Again, discussed case with pharmacy who believe perhaps patient is not taking Keppra according to ESRD dosing and may have been confused on the instructions. Will follow their recommendations of Keppra 500 mg PO DAILY + an additional 250 mg PO on dialysis days -Greatly appreciate pharmacies recommendations  -Received 1 mg Ativan shortly after event -250 mg PO Keppra given shortly after seizure -Complete 1000 mg loading with 750 mg Keppra IV -Dialysis today -- to get 250 mg PO post dialysis  ESRD on HD TThSat Dialysis today.   Hyperkalemia 6.4 this morning EKG without changes. Will be dialyzed today.   HIV Continuing Dapsone and antiretrovirals Ziagen, Prezcobix  and tivicay.  -CD4 count pending  Hypertension: hypertensive but expect improvement with HD. Continue current therapy.   Dispo: Anticipated discharge in approximately 3-4 day(s).   Jim Nery, DO 03/16/2016, 8:44 AM Pager: [phone]

## 2016-03-16 NOTE — Procedures (Signed)
Patient was seen on dialysis and the procedure was supervised.  BFR 400  Via AVF BP is  151/91.   Patient appears to be tolerating treatment well  Jim Wyatt A 03/16/2016

## 2016-03-16 NOTE — Progress Notes (Signed)
OT Cancellation Note  Patient Details Name: MEAD SLANE MRN: [id number] DOB: 04-30-1980   Cancelled Treatment:    Reason Eval/Treat Not Completed: Medical issues which prohibited therapy. Seizure-like activity earlier today.   Tyrone Schimke OTR/L Pager: [phone]  03/16/2016, 12:12 PM

## 2016-03-16 NOTE — Evaluation (Signed)
Physical Therapy Evaluation Patient Details Name: Jim Wyatt MRN: [id number] DOB: Jul 11, 1980 Today's Date: 03/16/2016   History of Present Illness  [name] is a 36 year old male with medical history significant for HIV with intermittent ART compliance, ESRD on HD, recent intussusception requiring ileocolic resection 40/98 with subsequent lower GI bleed and hospitalization and transfusion who presents for evaluation of a 2 day history of BRBPR and generalized abdominal pain.  Clinical Impression  Pt admitted with/for complications above.  Mobility evaluation limited due to decreased arousal.  Pt currently limited functionally due to the problems listed below.  (see problems list.)  Pt will benefit from PT to maximize function and safety to be able to get home safely with available assist.     Follow Up Recommendations Home health PT;Other (comment) (TBA further once can assess ful mobility)    Equipment Recommendations  None recommended by PT    Recommendations for Other Services       Precautions / Restrictions        Mobility  Bed Mobility Overal bed mobility: Needs Assistance Bed Mobility: Supine to Sit;Sit to Supine     Supine to sit: Min assist Sit to supine: Min assist   General bed mobility comments: due to arousal level  Transfers                 General transfer comment: pt's RN stopped the session due to seizure this am that had yet to be documented.  So aborted further mobility.  Ambulation/Gait                Stairs            Wheelchair Mobility    Modified Rankin (Stroke Patients Only)       Balance Overall balance assessment: Needs assistance   Sitting balance-Leahy Scale: Fair Sitting balance - Comments: sat at EOB without UE assist                                     Pertinent Vitals/Pain      Home Living Family/patient expects to be discharged to:: Private residence Living Arrangements:  Non-relatives/Friends Available Help at Discharge: Friend(s);Available PRN/intermittently Type of Home: House Home Access: Level entry     Home Layout: One level Home Equipment: Walker - 2 wheels;Crutches Additional Comments: big [place] sheppard at home    Prior Function Level of Independence: Independent         Comments: driving, 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,      Journalist, newspaper   Dominant Hand: Right    Extremity/Trunk Assessment                Communication   Communication: No difficulties  Cognition Arousal/Alertness: Lethargic Behavior During Therapy: Flat affect Overall Cognitive Status: Difficult to assess                      General Comments      Exercises     Assessment/Plan    PT Assessment Patient needs continued PT services  PT Problem List Decreased activity tolerance;Decreased mobility;Decreased strength          PT Treatment Interventions Gait training;Functional mobility training;Therapeutic activities;Patient/family education    PT Goals (Current goals can be found in the Care Plan section)  Acute Rehab PT Goals Patient Stated Goal: pt unable to to sedated PT Goal  Formulation: Patient unable to participate in goal setting Time For Goal Achievement: 03/23/16 Potential to Achieve Goals: Good    Frequency Min 3X/week   Barriers to discharge        Co-evaluation               End of Session   Activity Tolerance: Treatment limited secondary to medical complications (Comment) Patient left: in bed;with call bell/phone within reach;with bed alarm set Nurse Communication: Mobility status         Time: 1165-7903 PT Time Calculation (min) (ACUTE ONLY): 11 min   Charges:   PT Evaluation $PT Eval Low Complexity: 1 Procedure     PT G CodesTessie Fass Kamdyn Covel 03/16/2016, 11:53 AM 03/16/2016  Donnella Sham, PT [phone]  (pager)

## 2016-03-16 NOTE — Progress Notes (Signed)
Unable to complete neuro assessment. Patient unavailable at this time.

## 2016-03-16 NOTE — Progress Notes (Signed)
Pt refused nasal swab for MRSA PCR. Pt has previous history of MRSA and HD pt. Pt refused oral contrast. MD notified and called CT tech. MD called back to say that they will do CT without oral contrast. Pt taken to CT and is back resting in bed.

## 2016-03-16 NOTE — Progress Notes (Signed)
  Date: 03/16/2016  Patient name: Jim Wyatt  Medical record number: [id number]  Date of birth: 1980/07/12   I have seen and evaluated this patient and I have discussed the plan of care with the house staff. Please see Dr. Rober Minion note for complete details. I concur with her findings with the following additions/corrections:   Patient suffered a seizure around 10am this morning.  Had missed Keppra dosing due to hospitalization and hematochezia.  He is now getting placed on IV Keppra.  GI has seen him and agree that he needs a colonoscopy for evaluation.  Earliest would be Saturday.  Patient will also need to consent for procedure.  He was not able to make decisions this morning, possibly in a prodromal state leading up to seizure.  Reassess tomorrow.    Sid Falcon, MD 03/16/2016, 12:35 PM

## 2016-03-16 NOTE — Progress Notes (Signed)
Patient unavailable at this time. Unable to complete q2h neuro checks.

## 2016-03-16 NOTE — Progress Notes (Signed)
I received a call from the unit secretary stating that the patient was stating that he could not see. I immediately entered the patients room and noted that the patient was sitting at the end of the bed, fearful stating, "I can't see, I can't see." I remained with the patient while calling for help. At 1017, the patient became stiff, laid back in bed, and was unresponsive. My colleagues then came into help. At 1018, the patient began to shake. We turned the patient on his side, shifted him straight onto the bed, set up oxygen and suction; and remained with the patient until the seizure activity stopped at 1019. The rapid response nurse was called and arrived at approximately 1021. The MD was also called. The patient was confused. We were ordered to give 1 mg ativan and obtain a STAT 12 lead EKG. Orders followed. Also, MD ordered 250 mg PO of Keppra and 750 mg of Keppra Iv. Orders followed. Will continue to monitor.

## 2016-03-16 NOTE — Progress Notes (Signed)
MD originally ordered neuro checks every 30 minutes. MD notified. Changed order to neuro checks every 2 hours for 5 hours. Order to be followed. Will continue to monitor.

## 2016-03-16 NOTE — Progress Notes (Signed)
CRITICAL VALUE ALERT  Critical value received: K+:6.4  Date of notification: 03/16/16  Time of notification: 0820  Critical value read back: Yes  Nurse who received alert: Cardell Peach  MD notified (1st page): Helene Kelp, MD  Time of first page: [phone]  Responding MD: Helene Kelp, MD  Time MD responded: [phone]

## 2016-03-17 ENCOUNTER — Inpatient Hospital Stay (HOSPITAL_COMMUNITY): Payer: Medicare Other

## 2016-03-17 LAB — COMPREHENSIVE METABOLIC PANEL
ALT: 11 U/L — ABNORMAL LOW (ref 17–63)
AST: 28 U/L (ref 15–41)
Albumin: 2.5 g/dL — ABNORMAL LOW (ref 3.5–5.0)
Alkaline Phosphatase: 40 U/L (ref 38–126)
Anion gap: 13 (ref 5–15)
BUN: 39 mg/dL — ABNORMAL HIGH (ref 6–20)
CO2: 27 mmol/L (ref 22–32)
Calcium: 8.3 mg/dL — ABNORMAL LOW (ref 8.9–10.3)
Chloride: 96 mmol/L — ABNORMAL LOW (ref 101–111)
Creatinine, Ser: 10.05 mg/dL — ABNORMAL HIGH (ref 0.61–1.24)
GFR calc Af Amer: 7 mL/min — ABNORMAL LOW (ref >60)
GFR calc non Af Amer: 6 mL/min — ABNORMAL LOW (ref >60)
Glucose, Bld: 75 mg/dL (ref 65–99)
Potassium: 4.1 mmol/L (ref 3.5–5.1)
Sodium: 136 mmol/L (ref 135–145)
Total Bilirubin: 1 mg/dL (ref 0.3–1.2)
Total Protein: 6.8 g/dL (ref 6.5–8.1)

## 2016-03-17 LAB — PROTIME-INR
INR: 0.98
Prothrombin Time: 13 seconds (ref 11.4–15.2)

## 2016-03-17 LAB — CBC
HCT: 22.6 % — ABNORMAL LOW (ref 39.0–52.0)
Hemoglobin: 7.3 g/dL — ABNORMAL LOW (ref 13.0–17.0)
MCH: 30.4 pg (ref 26.0–34.0)
MCHC: 32.3 g/dL (ref 30.0–36.0)
MCV: 94.2 fL (ref 78.0–100.0)
Platelets: 99 10*3/uL — ABNORMAL LOW (ref 150–400)
RBC: 2.4 MIL/uL — ABNORMAL LOW (ref 4.22–5.81)
RDW: 19.4 % — ABNORMAL HIGH (ref 11.5–15.5)
WBC: 3.7 10*3/uL — ABNORMAL LOW (ref 4.0–10.5)

## 2016-03-17 LAB — FIBRINOGEN: Fibrinogen: 258 mg/dL (ref 210–475)

## 2016-03-17 LAB — APTT: aPTT: 42 seconds — ABNORMAL HIGH (ref 24–36)

## 2016-03-17 LAB — HAPTOGLOBIN: Haptoglobin: <10 mg/dL — ABNORMAL LOW (ref 34–200)

## 2016-03-17 LAB — GLUCOSE, CAPILLARY: Glucose-Capillary: 70 mg/dL (ref 65–99)

## 2016-03-17 MED ORDER — LIDOCAINE HCL (PF) 1 % IJ SOLN: 5.0000 mL | INTRAMUSCULAR | Status: DC | PRN

## 2016-03-17 MED ORDER — HEPARIN SODIUM (PORCINE) 1000 UNIT/ML DIALYSIS: 1000.0000 [IU] | INTRAMUSCULAR | Status: DC | PRN

## 2016-03-17 MED ORDER — ALTEPLASE 2 MG IJ SOLR: 2.0000 mg | Freq: Once | INTRAMUSCULAR | Status: DC | PRN

## 2016-03-17 MED ORDER — LIDOCAINE-PRILOCAINE 2.5-2.5 % EX CREA: 1.0000 "application " | TOPICAL_CREAM | CUTANEOUS | Status: DC | PRN

## 2016-03-17 MED ORDER — SODIUM CHLORIDE 0.9 % IV SOLN: 100.0000 mL | INTRAVENOUS | Status: DC | PRN

## 2016-03-17 MED ORDER — PEG 3350-KCL-NA BICARB-NACL 420 G PO SOLR
4000.0000 mL | Freq: Once | ORAL | Status: AC
  Administered 2016-03-17: 4000 mL via ORAL
  Filled 2016-03-17 (×2): qty 4000

## 2016-03-17 MED ORDER — PENTAFLUOROPROP-TETRAFLUOROETH EX AERO: 1.0000 "application " | INHALATION_SPRAY | CUTANEOUS | Status: DC | PRN

## 2016-03-17 MED ORDER — VENLAFAXINE HCL ER 75 MG PO CP24
75.0000 mg | ORAL_CAPSULE | Freq: Every day | ORAL | Status: DC
  Administered 2016-03-17 – 2016-03-21 (×5): 75 mg via ORAL
  Filled 2016-03-17 (×6): qty 1

## 2016-03-17 NOTE — Progress Notes (Signed)
Pts. B/P is 102/56 HR 96. MD notified. Instructed to give hydralazine. Med given. Will continue to monitor.

## 2016-03-17 NOTE — Progress Notes (Signed)
  Date: 03/17/2016  Patient name: Jim Wyatt  Medical record number: [id number]  Date of birth: Mar 02, 1980   I have seen and evaluated this patient and I have discussed the plan of care with the house staff. Please see Dr. Rober Minion note for complete details. I concur with her findings with the following additions/corrections:   Unfortunately, Jim Wyatt has not responded to conversation attempts by physicians or staff intermittently today.  Family will be coming in to discuss with him and assist.  He responds to verbal stimuli on occasion.  He is moving all extremities.  This is not consistent with delirium currently.   Sid Falcon, MD 03/17/2016, 3:38 PM

## 2016-03-17 NOTE — Progress Notes (Signed)
[facility] Cross Cover Progress Note  Jim Wyatt is a 35yo male with HIV/AIDS, ESRD on HD, h/o ileocecal intussusception admitted for GI bleed. Patient was evaluated at bedside at request of primary team in respect to new onset fever and colonoscopy prep.   Patient was sleeping comfortably in supine position on arrival. Patient denies current complaints, but after prompting from his mother, endorsed generalized abdominal discomfort for the last few hours that started even before starting [place], but had not had change to his diarrhea, and no N/V. Denies chest pain or shortness of breath currently. Mother states that he did have an episode of shortness of breath earlier and was placed on [place] but found to be saturating adequately on RA. Symptoms resolved with use of inhaler.   Physical Exam: Constitutional: NAD, VS reviewed - afebrile, normotensive, normocardic CV: RRR, no murmurs, rubs or gallops appreciated Resp: breathing comfortably on RA, CTAB, no crackles or wheezes Abd: normal bowel sounds, soft, nondistended, nontender, no guarding.   Assessment & Plan: Patient with new febrile episode earlier tonight with only change in symptoms being the mild gen abd discomfort which may or may not be attributed to the underlying inflammatory process in his intestines. Exam unchanged from priors and fever responded well with acetaminophen. CXR unremarkable.  --f/u BCx --plan for colonoscopy tomorrow --continued encouraging pt to complete prep  --continue monitoring vitals for return of fever and monitor for new symptoms  Alphonzo Grieve, MD IMTS - PGY1 Pager [phone]

## 2016-03-17 NOTE — Progress Notes (Signed)
OT Cancellation Note  Patient Details Name: Jim Wyatt MRN: [id number] DOB: [birth year]   Cancelled Treatment:    Reason Eval/Treat Not Completed: Other (comment) (with minimal response to MD  low BP noted)  Peri Maris  [phone] 03/17/2016, 3:56 PM

## 2016-03-17 NOTE — Progress Notes (Signed)
   Subjective: Mr. Wilczak was seen and evaluated today at bedside. Not cooperative to examination. Indicated he agreed with being tired.   Objective:  Vital signs in last 24 hours: Vitals:   03/16/16 2012 03/16/16 2200 03/17/16 0057 03/17/16 0534  BP: (!) 158/110 (!) 156/103 (!) 149/79 (!) 98/57  Pulse: (!) 112  (!) 109 87  Resp: 16   18  Temp: 99.3 F (37.4 C)   98.8 F (37.1 C)  TempSrc: Oral   Oral  SpO2: 91% 99% 93% 97%  Weight:    140 lb 14 oz (63.9 kg)  Height:       General: Chronically-ill appearing male. Not open to examination. Laying in bed, refusing to cooperate.  Cardiovascular: Regular rate and rhythm. No murmur or rub appreciated. Pulmonary: CTA BL on anterior chest. Abdomen: +bowel sounds.  Extremities: No peripheral edema noted BL. Psych: Mood dysthymic and closed to interview.   Assessment/Plan:  Principal Problem:   GI bleed Active Problems:   Human immunodeficiency virus (HIV) disease ([place])   Anemia due to end stage renal disease ([facility])   ESRD (end stage renal disease) ([place])   Seizure disorder ([place])   Hyperkalemia  GI Bleed Patient indicated he was still having bloody bowel movements this morning. GI saw and evaluated patient and understandibly signed off secondary to patients non-compliance and lack of willingness to participate. Colonoscopy incredibly important to this patients work-up and have contacted patients mother, his HCPOA who is coming to see the patient who apparently can often positively influence him. -GI will be reconsulted -Clear diet -f/u CBC in AM, Hb currently 8.4 -LDH elevated, Haptoglobin low  Seizure Disorder No more seizure activity since yesterday.  -Keppra 500 mg daily + 250 mg after dialysis  ESRD on HD TThSat  Dialysis tomorrow per nephro  Depression Starting effexor  Dispo: Anticipated discharge in approximately 3 day(s).   Timia Casselman, DO 03/17/2016, 7:46 AM Pager: [phone]

## 2016-03-17 NOTE — Progress Notes (Signed)
Patients B/P is 102/43 and HR 86. MD notified. Ordered to hold Coreg and Cardizem dose. Will continue to monitor.

## 2016-03-17 NOTE — Progress Notes (Signed)
Physical Therapy Treatment Patient Details Name: THELMA VIANA MRN: [id number] DOB: 1980-09-12 Today's Date: 03/17/2016    History of Present Illness Mr. Guerin Lashomb is a 36 year old male with medical history significant for HIV with intermittent ART compliance, ESRD on HD, recent intussusception requiring ileocolic resection 90/30 with subsequent lower GI bleed and hospitalization and transfusion who presents for evaluation of a 2 day history of BRBPR and generalized abdominal pain.    PT Comments    Finally alert/aroused enough to participate, wanting to get an update on his status and wanting to eat.  Mobilized at a supervision level.  Follow Up Recommendations  Home health PT (may not need follow up if gains independence over w/e)     Equipment Recommendations  None recommended by PT    Recommendations for Other Services       Precautions / Restrictions      Mobility  Bed Mobility Overal bed mobility: Modified Independent                Transfers Overall transfer level: Needs assistance   Transfers: Sit to/from Stand Sit to Stand: Supervision            Ambulation/Gait Ambulation/Gait assistance: Supervision Ambulation Distance (Feet): 600 Feet Assistive device: None Gait Pattern/deviations: Step-through pattern   Gait velocity interpretation: Below normal speed for age/gender General Gait Details: slower and some drifting.  Legs sore and "weak" from doing little over last 2 days   Stairs            Wheelchair Mobility    Modified Rankin (Stroke Patients Only)       Balance Overall balance assessment: Needs assistance   Sitting balance-Leahy Scale: Fair     Standing balance support: No upper extremity supported Standing balance-Leahy Scale: Fair                      Cognition Arousal/Alertness: Awake/alert Behavior During Therapy: Flat affect Overall Cognitive Status: Difficult to assess                       Exercises      General Comments General comments (skin integrity, edema, etc.): sats 89% on RA after ambulation  so reapplied oxygen      Pertinent Vitals/Pain Pain Assessment: No/denies pain    Home Living                      Prior Function            PT Goals (current goals can now be found in the care plan section) Acute Rehab PT Goals Patient Stated Goal: pt unable to to sedated PT Goal Formulation: Patient unable to participate in goal setting Time For Goal Achievement: 03/23/16 Potential to Achieve Goals: Good Progress towards PT goals: Progressing toward goals    Frequency    Min 3X/week      PT Plan Current plan remains appropriate    Co-evaluation             End of Session Equipment Utilized During Treatment: Oxygen Activity Tolerance: Patient tolerated treatment well Patient left: in bed;with call bell/phone within reach;with bed alarm set     Time: 1551-1620 PT Time Calculation (min) (ACUTE ONLY): 29 min  Charges:  $Gait Training: 8-22 mins $Therapeutic Activity: 8-22 mins                    G Codes:  Tessie Fass Mehgan Santmyer 03/17/2016, 4:35 PM 03/17/2016  Donnella Sham, [place] [phone]  (pager)

## 2016-03-17 NOTE — Progress Notes (Signed)
Subjective: Patient refuses to acknowledge hospital staff and physician.  Objective: Vital signs in last 24 hours: Temp:  [97.9 F (36.6 C)-99.3 F (37.4 C)] 98.5 F (36.9 C) (01/19 0934) Pulse Rate:  [87-116] 88 (01/19 0934) Resp:  [12-24] 18 (01/19 0934) BP: (98-205)/(43-112) 102/43 (01/19 0934) SpO2:  [91 %-99 %] 99 % (01/19 0934) Weight:  [63.9 kg (140 lb 14 oz)-68.1 kg (150 lb 2.1 oz)] 63.9 kg (140 lb 14 oz) (01/19 0534) Last BM Date: 03/15/16  Intake/Output from previous day: 01/18 0701 - 01/19 0700 In: 1131.4 [P.O.:480; Blood:651.4] Out: 1971  Intake/Output this shift: No intake/output data recorded.  General appearance: lying with eyes closed.  attempting to sleep GI: soft, nontender, nondistended.  Lab Results:  Recent Labs  03/16/16 0637 03/16/16 2100 03/17/16 0816  WBC 4.1 3.8* 3.7*  HGB 7.5* 8.4* 7.3*  HCT 23.4* 25.5* 22.6*  PLT 144* 110* PENDING   BMET  Recent Labs  03/15/16 1052 03/16/16 0637 03/17/16 0816  NA 135 136 136  K 5.5* 6.4* 4.1  CL 94* 93* 96*  CO2 23 25 27   GLUCOSE 84 94 75  BUN 95* 108* 39*  CREATININE 15.58* 17.48* 10.05*  CALCIUM 8.1* 8.5* 8.3*   LFT  Recent Labs  03/17/16 0816  PROT 6.8  ALBUMIN 2.5*  AST 28  ALT 11*  ALKPHOS 40  BILITOT 1.0   PT/INR  Recent Labs  03/17/16 0726  LABPROT 13.0  INR 0.98   Hepatitis Panel  Recent Labs  03/16/16 1300  HEPBSAG Negative   C-Diff No results for input(s): CDIFFTOX in the last 72 hours. Fecal Lactopherrin No results for input(s): FECLLACTOFRN in the last 72 hours.  Studies/Results: Ct Abdomen Pelvis Wo Contrast  Result Date: 03/16/2016 CLINICAL DATA:  Hematochezia. EXAM: CT ABDOMEN AND PELVIS WITHOUT CONTRAST TECHNIQUE: Multidetector CT imaging of the abdomen and pelvis was performed following the standard protocol without IV contrast. COMPARISON:  02/01/2016 FINDINGS: Lower chest: Unchanged cardiomegaly. There is a small pericardial effusion measuring  heterogeneous density, unchanged in size and appearance from prior exam. Trace left pleural thickening/fluid. Minimal left lower lobe atelectasis. Hepatobiliary: No evidence for focal lesion allowing for lack contrast. Gallbladder is physiologically distended. Probable gallbladder wall thickening. No calcified stone. Pancreas: Not well-defined given lack contrast and mesenteric edema. No evidence of ductal dilatation or inflammation. Spleen: Borderline splenomegaly again seen, spleen measures 12.3 cm. No gross focal in Adrenals/Urinary Tract: No evidence of adrenal nodule. Mild thinning of renal parenchyma without hydronephrosis. Patient with end-stage renal disease on dialysis. Urinary bladder is minimally distended Stomach/Bowel: Suboptimally defined given lack of enteric contrast and mesenteric edema. Stomach is physiologically distended. Enteric suture line in the right lower quadrant, patient is post ileus seek ectomy. Contrast within a few scattered colonic diverticula. There is probable wall thickening involving the transverse colon, and possibly descending and sigmoid colon to a lesser extent. No bowel dilatation to suggest obstruction. Vascular/Lymphatic: IVC appears prominent. Edema of the intra-abdominal fat limits assessment for retroperitoneal and abdominal adenopathy. Prominent inguinal nodes are unchanged from prior. Reproductive: Prostate is unremarkable. Soft tissue density in the left inguinal canal may be inguinal testis or complex fluid. Other: Diffuse infiltration of intra-abdominal and subcutaneous fat. Probable minimal free fluid in the abdomen and pelvis. No evidence of free air. Musculoskeletal: There are no acute or suspicious osseous abnormalities. IMPRESSION: 1. Probable colonic wall thickening, suggesting colitis which may be infectious or inflammatory. Reduce sensitivity given lack of contrast, paucity of intra- abdominal fat and  mesenteric edema. 2. Mesenteric and body wall edema,  chronic. 3. Small pericardial effusion. 4. Soft tissue density in the left inguinal canal may be heterogeneous fluid or inguinal testis. Recommend correlation with physical exam. 5. Question of gallbladder wall thickening. If there is concern for hepatobiliary pathology, right upper quadrant ultrasound recommended. Electronically Signed   By: Jeb Levering M.D.   On: 03/16/2016 01:06   Dg Abd 1 View  Result Date: 03/15/2016 CLINICAL DATA:  Abdominal pain EXAM: ABDOMEN - 1 VIEW COMPARISON:  CT abdomen and pelvis February 01, 2016 FINDINGS: The there is moderate stool in the colon. There is postoperative change in the right mid abdomen. There is no bowel dilatation or air-fluid level suggesting bowel obstruction. No free air. There are small phleboliths in the pelvis. IMPRESSION: No bowel obstruction or free air. Postoperative change right mid abdomen. Electronically Signed   By: Lowella Grip III M.D.   On: 03/15/2016 11:53    Medications:  Scheduled: . abacavir  600 mg Oral Q supper  . [START ON 03/20/2016] azithromycin  1,200 mg Oral Weekly  . carvedilol  25 mg Oral BID  . dapsone  100 mg Oral Daily  . [START ON 03/23/2016] darbepoetin (ARANESP) injection - DIALYSIS  200 mcg Intravenous Q Thu-HD  . darunavir-cobicistat  1 tablet Oral QPM  . diltiazem  180 mg Oral Daily  . dolutegravir  50 mg Oral QPM  . doxercalciferol  4 mcg Intravenous Q T,Th,Sa-HD  . ferric gluconate (FERRLECIT/NULECIT) IV  125 mg Intravenous Q Thu-HD  . hydrALAZINE  50 mg Oral TID  . levETIRAcetam  250 mg Oral Once per day on Tue Thu Sat  . levETIRAcetam  500 mg Oral Daily  . losartan  100 mg Oral QHS  . sodium chloride flush  3 mL Intravenous Q12H   Continuous:   Assessment/Plan: 1) Hematochezia. 2) Anemia. 3) Colitis.   I cannot treat the patient if he refuses to respond.  Just prior to my entry into the room the nurse tech reported that he was uncooperative by not responding.  No bleeding was reported to  me by the nurse tech.  Plan: 1) Unable to render treatment. 2) Reconsult when patient is a willing participant in his healthcare. 3) Signing off.  LOS: 2 days   Kareemah Grounds D 03/17/2016, 10:06 AM

## 2016-03-17 NOTE — Progress Notes (Signed)
 [facility] Progress Note   Subjective: " I'm still having bloody stools". When asked why he wouldn't talk to GI yesterday he said he was having a seizure and couldn't talk. Confirmed this with RN. C/O tenderness RUQ of abd.   Objective Vitals:   03/17/16 0057 03/17/16 0534 03/17/16 0752 03/17/16 0934  BP: (!) 149/79 (!) 98/57 (!) 109/56 (!) 102/43  Pulse: (!) 109 87  88  Resp:  18  18  Temp:  98.8 F (37.1 C)  98.5 F (36.9 C)  TempSrc:  Oral  Oral  SpO2: 93% 97%  99%  Weight:  63.9 kg (140 lb 14 oz)    Height:       Physical Exam General: Chronically ill appearing male, NAD ZOXWR:U0,A5, 2/6 systolic M Lungs:CTAB Abdomen: soft, tender upper quadrants Extremities: No LE edema.  Dialysis Access: LFA AVF + bruit   Additional Objective Labs: Basic Metabolic Panel:  Recent Labs Lab 03/15/16 1052 03/16/16 0637 03/17/16 0816  NA 135 136 136  K 5.5* 6.4* 4.1  CL 94* 93* 96*  CO2 23 25 27   GLUCOSE 84 94 75  BUN 95* 108* 39*  CREATININE 15.58* 17.48* 10.05*  CALCIUM 8.1* 8.5* 8.3*   Liver Function Tests:  Recent Labs Lab 03/15/16 1052 03/16/16 0637 03/17/16 0816  AST 30 30 28   ALT 12* 11* 11*  ALKPHOS 44 48 40  BILITOT 0.7 0.8 1.0  PROT 7.2 7.9 6.8  ALBUMIN 2.8* 3.1* 2.5*   No results for input(s): LIPASE, AMYLASE in the last 168 hours. CBC:  Recent Labs Lab 03/15/16 1052 03/15/16 2136 03/16/16 0637 03/16/16 2100 03/17/16 0816  WBC 3.9* 4.2 4.1 3.8* 3.7*  NEUTROABS 3.0  --   --   --   --   HGB 6.4* 7.3* 7.5* 8.4* 7.3*  HCT 19.8* 22.7* 23.4* 25.5* 22.6*  MCV 95.2 94.2 95.1 93.4 94.2  PLT 102* 113* 144* 110* 99*   Blood Culture    Component Value Date/Time   SDES EAR RIGHT 11/23/2015 1638   SPECREQUEST NONE 11/23/2015 1638   CULT  11/23/2015 1638    RARE METHICILLIN RESISTANT STAPHYLOCOCCUS AUREUS RARE STAPHYLOCOCCUS LUGDUNENSIS    REPTSTATUS 11/27/2015 FINAL 11/23/2015 1638    Cardiac Enzymes: No results for input(s):  CKTOTAL, CKMB, CKMBINDEX, TROPONINI in the last 168 hours. CBG:  Recent Labs Lab 03/16/16 0806 03/16/16 1021 03/17/16 0805  GLUCAP 76 100* 70   Iron Studies: No results for input(s): IRON, TIBC, TRANSFERRIN, FERRITIN in the last 72 hours. @lablastinr3 @ Studies/Results: Ct Abdomen Pelvis Wo Contrast  Result Date: 03/16/2016 CLINICAL DATA:  Hematochezia. EXAM: CT ABDOMEN AND PELVIS WITHOUT CONTRAST TECHNIQUE: Multidetector CT imaging of the abdomen and pelvis was performed following the standard protocol without IV contrast. COMPARISON:  02/01/2016 FINDINGS: Lower chest: Unchanged cardiomegaly. There is a small pericardial effusion measuring heterogeneous density, unchanged in size and appearance from prior exam. Trace left pleural thickening/fluid. Minimal left lower lobe atelectasis. Hepatobiliary: No evidence for focal lesion allowing for lack contrast. Gallbladder is physiologically distended. Probable gallbladder wall thickening. No calcified stone. Pancreas: Not well-defined given lack contrast and mesenteric edema. No evidence of ductal dilatation or inflammation. Spleen: Borderline splenomegaly again seen, spleen measures 12.3 cm. No gross focal in Adrenals/Urinary Tract: No evidence of adrenal nodule. Mild thinning of renal parenchyma without hydronephrosis. Patient with end-stage renal disease on dialysis. Urinary bladder is minimally distended Stomach/Bowel: Suboptimally defined given lack of enteric contrast and mesenteric edema. Stomach is physiologically distended. Enteric suture line  in the right lower quadrant, patient is post ileus seek ectomy. Contrast within a few scattered colonic diverticula. There is probable wall thickening involving the transverse colon, and possibly descending and sigmoid colon to a lesser extent. No bowel dilatation to suggest obstruction. Vascular/Lymphatic: IVC appears prominent. Edema of the intra-abdominal fat limits assessment for retroperitoneal and  abdominal adenopathy. Prominent inguinal nodes are unchanged from prior. Reproductive: Prostate is unremarkable. Soft tissue density in the left inguinal canal may be inguinal testis or complex fluid. Other: Diffuse infiltration of intra-abdominal and subcutaneous fat. Probable minimal free fluid in the abdomen and pelvis. No evidence of free air. Musculoskeletal: There are no acute or suspicious osseous abnormalities. IMPRESSION: 1. Probable colonic wall thickening, suggesting colitis which may be infectious or inflammatory. Reduce sensitivity given lack of contrast, paucity of intra- abdominal fat and mesenteric edema. 2. Mesenteric and body wall edema, chronic. 3. Small pericardial effusion. 4. Soft tissue density in the left inguinal canal may be heterogeneous fluid or inguinal testis. Recommend correlation with physical exam. 5. Question of gallbladder wall thickening. If there is concern for hepatobiliary pathology, right upper quadrant ultrasound recommended. Electronically Signed   By: Jeb Levering M.D.   On: 03/16/2016 01:06   Medications:  . abacavir  600 mg Oral Q supper  . [START ON 03/20/2016] azithromycin  1,200 mg Oral Weekly  . carvedilol  25 mg Oral BID  . dapsone  100 mg Oral Daily  . [START ON 03/23/2016] darbepoetin (ARANESP) injection - DIALYSIS  200 mcg Intravenous Q Thu-HD  . darunavir-cobicistat  1 tablet Oral QPM  . diltiazem  180 mg Oral Daily  . dolutegravir  50 mg Oral QPM  . doxercalciferol  4 mcg Intravenous Q T,Th,Sa-HD  . ferric gluconate (FERRLECIT/NULECIT) IV  125 mg Intravenous Q Thu-HD  . hydrALAZINE  50 mg Oral TID  . levETIRAcetam  250 mg Oral Once per day on Tue Thu Sat  . levETIRAcetam  500 mg Oral Daily  . losartan  100 mg Oral QHS  . sodium chloride flush  3 mL Intravenous Q12H  . venlafaxine XR  75 mg Oral Daily     Dialysis Orders: [facility]: [facility] . EDW 66 kg HD Bath 2k, 2ca  Time 3hrs 29min Heparin 2,000. Access LUA AVF     Hec4 mcg IV/HD    mircera 255mcg q 2wks  Hd  Last on    Units IV/HD   Other op labs hgb 8.3  03/09/16 ca 9 phos 7.4  pth 942  TFS29%   Assessment/Plan 1. Hyperkalemia: Resolved. K+ 4.1 today.  2. ESRD - [facility]. Next HD tomorrow. 2.0 K bath. No heparin.  3. Lower GI Bleed: per admit team/ Gi Consulted  Refusing to talk to GI. GI signed off however pt says he was having a seizure when MD was present.  4. Hypertension/volume  - BP up on admit 179/114> 154/87  ,meds (in past ??comlaince Noted bp down with admits and up at op kid center). HD 03/15/16 Pre wt 68.1 kg Net UF 1971 Post wt 66 kg. For HD tomorrow UFG to 66 kg.  5. Anemia  -HGB 7.3 today. Max ESA at OP unit last dose 03/09/16. Rec'd 2 unit of PRBCs since adm. Tranfuse on HD tomorrow if HGB < 7.0 6. Metabolic bone disease -  Hec  q hd binder when pos 7. HIV -meds per admit 8. Nutrition: Clear liq diet.  9. Seizures: H/O seizure disorder. Had seizure yesterday. Has been  restarted on Keppra. No seizure activity today.    H.  NP-C 03/17/2016, 1:37 PM  [employer] [phone]

## 2016-03-17 NOTE — Progress Notes (Signed)
Patient refused MRSA swab. Patient educated.

## 2016-03-18 ENCOUNTER — Encounter (HOSPITAL_COMMUNITY)
Admission: EM | Disposition: A | Discharge status: Home / Self Care | Payer: Self-pay | Source: Home / Self Care | Attending: Internal Medicine

## 2016-03-18 ENCOUNTER — Encounter (HOSPITAL_COMMUNITY): Payer: Self-pay | Admitting: *Deleted

## 2016-03-18 DIAGNOSIS — K625 Hemorrhage of anus and rectum: Secondary | ICD-10-CM

## 2016-03-18 DIAGNOSIS — B20 Human immunodeficiency virus [HIV] disease: Secondary | ICD-10-CM

## 2016-03-18 DIAGNOSIS — K648 Other hemorrhoids: Principal | ICD-10-CM

## 2016-03-18 DIAGNOSIS — R933 Abnormal findings on diagnostic imaging of other parts of digestive tract: Secondary | ICD-10-CM

## 2016-03-18 DIAGNOSIS — K6389 Other specified diseases of intestine: Secondary | ICD-10-CM

## 2016-03-18 DIAGNOSIS — K921 Melena: Secondary | ICD-10-CM

## 2016-03-18 HISTORY — PX: COLONOSCOPY: SHX5424

## 2016-03-18 LAB — COMPREHENSIVE METABOLIC PANEL
ALT: 12 U/L — ABNORMAL LOW (ref 17–63)
AST: 23 U/L (ref 15–41)
Albumin: 2.3 g/dL — ABNORMAL LOW (ref 3.5–5.0)
Alkaline Phosphatase: 34 U/L — ABNORMAL LOW (ref 38–126)
Anion gap: 13 (ref 5–15)
BUN: 48 mg/dL — ABNORMAL HIGH (ref 6–20)
CO2: 25 mmol/L (ref 22–32)
Calcium: 8.4 mg/dL — ABNORMAL LOW (ref 8.9–10.3)
Chloride: 96 mmol/L — ABNORMAL LOW (ref 101–111)
Creatinine, Ser: 12.31 mg/dL — ABNORMAL HIGH (ref 0.61–1.24)
GFR calc Af Amer: 5 mL/min — ABNORMAL LOW (ref >60)
GFR calc non Af Amer: 5 mL/min — ABNORMAL LOW (ref >60)
Glucose, Bld: 82 mg/dL (ref 65–99)
Potassium: 4.2 mmol/L (ref 3.5–5.1)
Sodium: 134 mmol/L — ABNORMAL LOW (ref 135–145)
Total Bilirubin: 0.8 mg/dL (ref 0.3–1.2)
Total Protein: 6 g/dL — ABNORMAL LOW (ref 6.5–8.1)

## 2016-03-18 LAB — CBC
HCT: 21 % — ABNORMAL LOW (ref 39.0–52.0)
Hemoglobin: 6.7 g/dL — CL (ref 13.0–17.0)
MCH: 30.5 pg (ref 26.0–34.0)
MCHC: 31.9 g/dL (ref 30.0–36.0)
MCV: 95.5 fL (ref 78.0–100.0)
Platelets: 117 10*3/uL — ABNORMAL LOW (ref 150–400)
RBC: 2.2 MIL/uL — ABNORMAL LOW (ref 4.22–5.81)
RDW: 19.1 % — ABNORMAL HIGH (ref 11.5–15.5)
WBC: 2.5 10*3/uL — ABNORMAL LOW (ref 4.0–10.5)

## 2016-03-18 LAB — PREPARE RBC (CROSSMATCH)

## 2016-03-18 LAB — GLUCOSE, CAPILLARY: Glucose-Capillary: 77 mg/dL (ref 65–99)

## 2016-03-18 SURGERY — COLONOSCOPY
Anesthesia: Moderate Sedation

## 2016-03-18 MED ORDER — FENTANYL CITRATE (PF) 100 MCG/2ML IJ SOLN
INTRAMUSCULAR | Status: AC
  Filled 2016-03-18: qty 2

## 2016-03-18 MED ORDER — PEG 3350-KCL-NA BICARB-NACL 420 G PO SOLR: 4000.0000 mL | Freq: Once | ORAL | Status: DC

## 2016-03-18 MED ORDER — SODIUM CHLORIDE 0.9 % IV SOLN
INTRAVENOUS | Status: DC
  Administered 2016-03-18: 500 mL via INTRAVENOUS

## 2016-03-18 MED ORDER — MIDAZOLAM HCL 5 MG/5ML IJ SOLN
INTRAMUSCULAR | Status: DC | PRN
  Administered 2016-03-18 (×5): 1 mg via INTRAVENOUS

## 2016-03-18 MED ORDER — FENTANYL CITRATE (PF) 100 MCG/2ML IJ SOLN
INTRAMUSCULAR | Status: DC | PRN
  Administered 2016-03-18 (×2): 25 ug via INTRAVENOUS

## 2016-03-18 MED ORDER — SODIUM CHLORIDE 0.9 % IV SOLN: Freq: Once | INTRAVENOUS | Status: DC

## 2016-03-18 MED ORDER — DIPHENHYDRAMINE HCL 50 MG/ML IJ SOLN
INTRAMUSCULAR | Status: AC
  Filled 2016-03-18: qty 1

## 2016-03-18 MED ORDER — MIDAZOLAM HCL 5 MG/ML IJ SOLN
INTRAMUSCULAR | Status: AC
  Filled 2016-03-18: qty 2

## 2016-03-18 MED ORDER — LEVETIRACETAM 500 MG PO TABS
500.0000 mg | ORAL_TABLET | Freq: Every day | ORAL | Status: DC
  Administered 2016-03-18 – 2016-03-21 (×4): 500 mg via ORAL
  Filled 2016-03-18 (×3): qty 1

## 2016-03-18 NOTE — Progress Notes (Signed)
[facility] ROUNDING NOTE   Subjective:   Interval History:  Agrees to bowel prep  No complaints   Objective:  Vital signs in last 24 hours:  Temp:  [97.8 F (36.6 C)-101.2 F (38.4 C)] 97.8 F (36.6 C) (01/20 0559) Pulse Rate:  [74-98] 76 (01/20 0559) Resp:  [16-18] 16 (01/20 0559) BP: (100-118)/(43-69) 117/64 (01/20 0559) SpO2:  [92 %-100 %] 94 % (01/20 0559) Weight:  [65 kg (143 lb 4.8 oz)] 65 kg (143 lb 4.8 oz) (01/19 2057)  Weight change: -3.099 kg (-6 lb 13.3 oz) Filed Weights   03/16/16 1638 03/17/16 0534 03/17/16 2057  Weight: 66 kg (145 lb 8.1 oz) 63.9 kg (140 lb 14 oz) 65 kg (143 lb 4.8 oz)    Intake/Output: I/O last 3 completed shifts: In: 480 [P.O.:480] Out: 0    Intake/Output this shift:  No intake/output data recorded.  CVS- RRR RS- CTA ABD- BS present soft non-distended EXT- no edema   Basic Metabolic Panel:  Recent Labs Lab 03/15/16 1052 03/16/16 0637 03/17/16 0816 03/18/16 0433  NA 135 136 136 134*  K 5.5* 6.4* 4.1 4.2  CL 94* 93* 96* 96*  CO2 23 25 27 25   GLUCOSE 84 94 75 82  BUN 95* 108* 39* 48*  CREATININE 15.58* 17.48* 10.05* 12.31*  CALCIUM 8.1* 8.5* 8.3* 8.4*    Liver Function Tests:  Recent Labs Lab 03/15/16 1052 03/16/16 0637 03/17/16 0816 03/18/16 0433  AST 30 30 28 23   ALT 12* 11* 11* 12*  ALKPHOS 44 48 40 34*  BILITOT 0.7 0.8 1.0 0.8  PROT 7.2 7.9 6.8 6.0*  ALBUMIN 2.8* 3.1* 2.5* 2.3*   No results for input(s): LIPASE, AMYLASE in the last 168 hours. No results for input(s): AMMONIA in the last 168 hours.  CBC:  Recent Labs Lab 03/15/16 1052 03/15/16 2136 03/16/16 0637 03/16/16 2100 03/17/16 0816 03/18/16 0433  WBC 3.9* 4.2 4.1 3.8* 3.7* 2.5*  NEUTROABS 3.0  --   --   --   --   --   HGB 6.4* 7.3* 7.5* 8.4* 7.3* 6.7*  HCT 19.8* 22.7* 23.4* 25.5* 22.6* 21.0*  MCV 95.2 94.2 95.1 93.4 94.2 95.5  PLT 102* 113* 144* 110* 99* 117*    Cardiac Enzymes: No results for input(s): CKTOTAL, CKMB,  CKMBINDEX, TROPONINI in the last 168 hours.  BNP: Invalid input(s): POCBNP  CBG:  Recent Labs Lab 03/16/16 0806 03/16/16 1021 03/17/16 0805 03/18/16 0831  GLUCAP 76 100* 70 59    Microbiology: Results for orders placed or performed during the hospital encounter of 12/28/15  MRSA PCR Screening     Status: Abnormal   Collection Time: 12/28/15  9:43 PM  Result Value Ref Range Status   MRSA by PCR POSITIVE (A) NEGATIVE Final    Comment:        The GeneXpert MRSA Assay (FDA approved for NASAL specimens only), is one component of a comprehensive MRSA colonization surveillance program. It is not intended to diagnose MRSA infection nor to guide or monitor treatment for MRSA infections. RESULT CALLED TO, READ BACK BY AND VERIFIED WITH: C LUCK,RN @0056  12/29/15 MKELLY,MLT     Coagulation Studies:  Recent Labs  03/17/16 0726  LABPROT 13.0  INR 0.98    Urinalysis: No results for input(s): COLORURINE, LABSPEC, PHURINE, GLUCOSEU, HGBUR, BILIRUBINUR, KETONESUR, PROTEINUR, UROBILINOGEN, NITRITE, LEUKOCYTESUR in the last 72 hours.  Invalid input(s): APPERANCEUR    Imaging: Dg Chest Port 1 View  Result Date: 03/17/2016 CLINICAL DATA:  Fever. EXAM: PORTABLE CHEST 1 VIEW COMPARISON:  Radiograph 01/01/2016. Lung bases from CT abdomen yesterday. FINDINGS: Stable cardiomegaly. No pulmonary edema. Trace left pleural fluid on recent CT is not seen radiographically. No large pleural effusion. No focal airspace disease. No pneumothorax. IMPRESSION: Stable cardiomegaly without acute abnormality. Electronically Signed   By: Jeb Levering M.D.   On: 03/17/2016 22:02     Medications:    . abacavir  600 mg Oral Q supper  . [START ON 03/20/2016] azithromycin  1,200 mg Oral Weekly  . carvedilol  25 mg Oral BID  . dapsone  100 mg Oral Daily  . [START ON 03/23/2016] darbepoetin (ARANESP) injection - DIALYSIS  200 mcg Intravenous Q Thu-HD  . darunavir-cobicistat  1 tablet Oral QPM  .  diltiazem  180 mg Oral Daily  . dolutegravir  50 mg Oral QPM  . doxercalciferol  4 mcg Intravenous Q T,Th,Sa-HD  . ferric gluconate (FERRLECIT/NULECIT) IV  125 mg Intravenous Q Thu-HD  . hydrALAZINE  50 mg Oral TID  . levETIRAcetam  250 mg Oral Once per day on Tue Thu Sat  . levETIRAcetam  500 mg Oral Daily  . losartan  100 mg Oral QHS  . sodium chloride flush  3 mL Intravenous Q12H  . venlafaxine XR  75 mg Oral Daily   sodium chloride, acetaminophen **OR** acetaminophen, docusate sodium, HYDROcodone-acetaminophen, ondansetron **OR** ondansetron (ZOFRAN) IV, senna-docusate  Assessment/ Plan:  1. Hyperkalemia: Resolved. K+ 4.1 today.  2. ESRD - [facility] [place]. 2.0 K bath. No heparin.  3. Lower GI Bleed: per admit team/ Gi Consulted  4. Hypertension/volume - BP up on admit 179/114>154/87 ,meds (in past ??comlaince Noted bp down with admits and up at op kid center). HD 03/15/16 Pre wt 68.1 kg Net UF 1971 Post wt 66 kg. For HD today UFG to 66 kg.  5. Anemia -HGB 7.3 today. Max ESA at OP unit last dose 03/09/16. Rec'd 2 unit of PRBCs since adm. Tranfuse on HD if HGB < 7.0 6. Metabolic bone disease - Hec q hd binder when pos 7. HIV -meds per admit 8. Nutrition: Clear liq diet.  9. Seizures: H/O seizure disorder. Had seizure yesterday. Has been restarted on Keppra. No seizure activity today.      LOS: 3 Suleima Ohlendorf W @TODAY @8 :41 AM

## 2016-03-18 NOTE — Progress Notes (Addendum)
   Subjective:  Pt had spiked a one time fever to 101.2 which had resolved after tylenol- Currently afebrile. CXR negative. BC pending. Mother at bedside.  He had drunk half of the golytely. Mother did confirm he had few more bloody diarrhea after golytely.  Discussed with Dr Havery Moros- he is on schedule for colonoscopy today afternoon.   Objective:  Vital signs in last 24 hours: Vitals:   03/17/16 1820 03/17/16 2057 03/18/16 0004 03/18/16 0559  BP: 113/61 118/69 100/62 117/64  Pulse: 98 87 74 76  Resp: 16 16 16 16   Temp: (!) 101.2 F (38.4 C) 99.2 F (37.3 C) 99.1 F (37.3 C) 97.8 F (36.6 C)  TempSrc: Oral Oral Oral Oral  SpO2: 100% 97% 92% 94%  Weight:  143 lb 4.8 oz (65 kg)    Height:  5\' 7"  (1.702 m)     General: Chronically-ill appearing male. Not open to examination. Laying in bed, uttered few words today. Mother at bedside   Cardiovascular: Regular rate and rhythm. No murmur or rub appreciated. MSK: a dime size mobile mass under leftr nipple Pulmonary: CTA BL on anterior chest. Abdomen: +bowel sounds.  Extremities: No peripheral edema noted BL.  Assessment/Plan:  Principal Problem:   GI bleed Active Problems:   Human immunodeficiency virus (HIV) disease (HCC)   Anemia due to end stage renal disease (HCC)   ESRD (end stage renal disease) (HCC)   Seizure disorder (HCC)   Hyperkalemia  GI Bleed: NPO currently. Colonoscopy this afternoon. HgB of 6.7 this morning. Will be gettingg transfusion during dialysis later today.   -colonoscopy today  -transfuse if < 7- ordered 1 unit to be given during dialysis   Seizure Disorder -Keppra 500 mg daily + 250 mg after dialysis  ESRD on HD TThSat  Dialysis today   Depression -effexor  Dime sized mobile mass under left nipple: mother concerned about mass. Mass able to be palpated. -May need an ultrasound to characterize if cystic.    DVT PPX: holding heparin for GIB.  Dispo: Anticipated discharge in  approximately 2 day(s).   Burgess Estelle, MD 03/18/2016, 12:41 PM Pager: [phone]

## 2016-03-18 NOTE — Op Note (Signed)
[facility] Patient Name: Jim Wyatt Procedure Date : 03/18/2016 MRN: [id number] Attending MD: Carlota Raspberry. Armbruster MD, MD Date of Birth: 09/26/1980 CSN: [id number] Age: 36 Admit Type: Inpatient Procedure:                Colonoscopy Indications:              Generalized abdominal pain, Hematochezia, Abnormal                            CT of the GI tract, history of HIV with low CD4                            count Providers:                Remo Lipps P. Armbruster MD, MD, Cleda Daub, RN,                            William Dalton, Technician Referring MD:              Medicines:                Fentanyl 50 micrograms IV, Midazolam 5 mg IV Complications:            No immediate complications. Estimated blood loss:                            Minimal. Estimated Blood Loss:     Estimated blood loss was minimal. Procedure:                Pre-Anesthesia Assessment:                           - Prior to the procedure, a History and Physical                            was performed, and patient medications and                            allergies were reviewed. The patient's tolerance of                            previous anesthesia was also reviewed. The risks                            and benefits of the procedure and the sedation                            options and risks were discussed with the patient.                            All questions were answered, and informed consent                            was obtained. Prior Anticoagulants: The patient has  taken no previous anticoagulant or antiplatelet                            agents. ASA Grade Assessment: III - A patient with                            severe systemic disease. After reviewing the risks                            and benefits, the patient was deemed in                            satisfactory condition to undergo the procedure.                           After obtaining  informed consent, the colonoscope                            was passed under direct vision. Throughout the                            procedure, the patient's blood pressure, pulse, and                            oxygen saturations were monitored continuously. The                            EC-[id number] ([id number]) scope was introduced through                            the anus and advanced to the the ileocolonic                            anastomosis. The colonoscopy was performed without                            difficulty. The patient tolerated the procedure                            well. The quality of the bowel preparation was                            fair. Ileocolonic anastomosis and the rectum were                            photographed. Scope In: 2:21:54 PM Scope Out: 2:43:03 PM Scope Withdrawal Time: 0 hours 15 minutes 14 seconds  Total Procedure Duration: 0 hours 21 minutes 9 seconds  Findings:      The perianal exam findings include non-thrombosed external hemorrhoids.      A patchy area of mucosa was found at the anastomosis, with areas of       erythema and a few dark purple patches, one of which had a ? fistulous       tract noted. Biopsies were taken with  a cold forceps for histology. No       evidence or stigmata of bleeding noted.      The neo-terminal ileum appeared normal.      A few medium-mouthed diverticula were found in the left colon.      Internal hemorrhoids were found during retroflexion.      The bowel prep was only fair but NO blood was noted anywhere in the       colon. The exam was otherwise without abnormality. Impression:               - Preparation of the colon was fair.                           - Non-thrombosed external hemorrhoids found on                            perianal exam.                           - Mucosa at the colonic anastomosis as described                            above - unclear if this represents benign post                             surgical changes versus other. Biopsied.                           - The examined portion of the ileum was normal.                           - Diverticulosis in the left colon.                           - Internal hemorrhoids.                           Overall no inflammation noted to correlate with CT                            findings. No blood in the colon. Bleeding could be                            due to hemorrhoids (internal and large external) Moderate Sedation:      Moderate (conscious) sedation was administered by the endoscopy nurse       and supervised by the endoscopist. The following parameters were       monitored: oxygen saturation, heart rate, blood pressure, and response       to care. Total physician intraservice time was 25 minutes. Recommendation:           - Return patient to hospital ward for ongoing care.                           - Advance diet as tolerated.                           -  Continue present medications.                           - Await pathology results                           - Consideration for EGD pending course                           - GI service will follow Procedure Code(s):        --- Professional ---                           [id number], Colonoscopy, flexible; with biopsy, single                            or multiple                           99152, Moderate sedation services provided by the                            same physician or other qualified health care                            professional performing the diagnostic or                            therapeutic service that the sedation supports,                            requiring the presence of an independent trained                            observer to assist in the monitoring of the                            patient's level of consciousness and physiological                            status; initial 15 minutes of intraservice time,                             patient age 49 years or older                           [phone], Moderate sedation services; each additional                            15 minutes intraservice time Diagnosis Code(s):        --- Professional ---                           K64.4, Residual hemorrhoidal skin tags  K64.8, Other hemorrhoids                           K63.89, Other specified diseases of intestine                           R10.84, Generalized abdominal pain                           K92.1, Melena (includes Hematochezia)                           K57.30, Diverticulosis of large intestine without                            perforation or abscess without bleeding                           R93.3, Abnormal findings on diagnostic imaging of                            other parts of digestive tract CPT copyright 2016 [employer]. All rights reserved. The codes documented in this report are preliminary and upon coder review may  be revised to meet current compliance requirements. Remo Lipps P. Armbruster MD, MD 03/18/2016 2:58:09 PM This report has been signed electronically. Number of Addenda: 0

## 2016-03-18 NOTE — Progress Notes (Signed)
After multiple attempts of encouraging pt to drink colon prep, pt was only able to drink half of the golytely. Pt says his BMs are not clear yet. Will continue to monitor.

## 2016-03-18 NOTE — Interval H&P Note (Signed)
History and Physical Interval Note:  03/18/2016 2:14 PM  Jim Wyatt  has presented today for surgery, with the diagnosis of Hematochezia and abnormal CT scan  The various methods of treatment have been discussed with the patient and family. After consideration of risks, benefits and other options for treatment, the patient has consented to  Procedure(s): COLONOSCOPY (N/A) as a surgical intervention .  The patient's history has been reviewed, patient examined, no change in status, stable for surgery.  I have reviewed the patient's chart and labs.  Questions were answered to the patient's satisfaction.     [name]

## 2016-03-18 NOTE — Progress Notes (Signed)
  Date: 03/18/2016  Patient name: Jim Wyatt  Medical record number: [id number]  Date of birth: 04-03-1980   I have seen and evaluated this patient and I have discussed the plan of care with the house staff. Please see Dr. Sherlynn Carbon note for complete details. I concur with his findings.  Sid Falcon, MD 03/18/2016, 2:11 PM

## 2016-03-18 NOTE — H&P (View-Only) (Signed)
Reason for Consult: Hematochezia Referring Physician: [facility]  [name] HPI: This is a 36 year old male with a PMH of HIV, ESRD and s/p ileocolonic intussusception resection 11/2015 admitted with complaints of progressive hematochezia and abdominal pain.  The patient reports multiple episodes of hematochezia and his HGB was at 6.4 g/dL on admission.  Further evaluation with a CT scan of the abdomen was suggestive for an inflammatory component to the right side of the colon.  There was also a chronic mesenteric edema noted, which appears to be stable.   Past Medical History:  Diagnosis Date  . Asthma   . CAP (community acquired pneumonia) 10/2013  . Cellulitis 11/2013   LLE  . DVT (deep venous thrombosis) ([place]) 10/2013   BLE  . 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 Went on HD Dec 2016.   [name] Fever   . Headache   . HIV infection ([place]) dx'd 10/2013  . Hypertension   . Leg pain   . Multiple environmental allergies    "trees, dogs, cats"  . Multiple food allergies   . Shingles < 2010  . 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 AV FISTULA PLACEMENT Left 03/03/2015   Procedure: Creation of Left Arm RADIOCEPHALIC ARTERIOVENOUS FISTULA ;  Surgeon: Mal Misty, MD;  Location: [facility];  Service: Vascular;  Laterality: Left;  . FEMUR FRACTURE SURGERY Right 09/2001  . FRACTURE SURGERY    . I&D EXTREMITY Right 12/18/2014   Procedure: IRRIGATION AND DEBRIDEMENT RIGHT LOWER EXTREMITY;  Surgeon: Ralene Ok, MD;  Location: [facility];  Service: General;  Laterality: Right;  . INSERTION OF DIALYSIS CATHETER  2016  . LAPAROTOMY N/A 12/28/2015   Procedure: EXPLORATORY LAPAROTOMY BOWEL RESECTION ILEOCECECTOMY;  Surgeon: Clovis Riley, MD;  Location: [place] [facility];  Service: General;  Laterality: N/A;    Family History  Problem Relation Age of Onset  . Hypertension Mother   .  Diabetes Maternal Grandmother   . Kidney disease Maternal Grandmother     Social History:  reports that he has never smoked. He has never used smokeless tobacco. He reports that he does not drink alcohol or use drugs.  Allergies:  Allergies  Allergen Reactions  . Bactrim [Sulfamethoxazole-Trimethoprim] Other (See Comments)    Renal failure  . Vancomycin Other (See Comments)    Patient states vancomycin caused kidney injury  . Reglan [Metoclopramide] Other (See Comments)    Causes ticks/ jerks  . Amlodipine Palpitations and Other (See Comments)    fatigue  . Augmentin [Amoxicillin-Pot Clavulanate] Hives    Medications:  Scheduled: . sodium chloride   Intravenous Once  . abacavir  600 mg Oral Q supper  . carvedilol  25 mg Oral BID  . dapsone  100 mg Oral Daily  . darunavir-cobicistat  1 tablet Oral QPM  . diltiazem  180 mg Oral Daily  . dolutegravir  50 mg Oral QPM  . hydrALAZINE  50 mg Oral TID  . levETIRAcetam  250 mg Oral Once per day on Tue Thu Sat  . [START ON 03/17/2016] levETIRAcetam  500 mg Oral QODAY  . losartan  100 mg Oral QHS  . sodium chloride flush  3 mL Intravenous Q12H   Continuous:   Results for orders placed or performed during the hospital encounter of 03/15/16 (from the past 24 hour(s))  Comprehensive metabolic panel  Status: Abnormal   Collection Time: 03/15/16 10:52 AM  Result Value Ref Range   Sodium 135 135 - 145 mmol/L   Potassium 5.5 (H) 3.5 - 5.1 mmol/L   Chloride 94 (L) 101 - 111 mmol/L   CO2 23 22 - 32 mmol/L   Glucose, Bld 84 65 - 99 mg/dL   BUN 95 (H) 6 - 20 mg/dL   Creatinine, Ser 15.58 (H) 0.61 - 1.24 mg/dL   Calcium 8.1 (L) 8.9 - 10.3 mg/dL   Total Protein 7.2 6.5 - 8.1 g/dL   Albumin 2.8 (L) 3.5 - 5.0 g/dL   AST 30 15 - 41 U/L   ALT 12 (L) 17 - 63 U/L   Alkaline Phosphatase 44 38 - 126 U/L   Total Bilirubin 0.7 0.3 - 1.2 mg/dL   GFR calc non Af Amer 4 (L) >60 mL/min   GFR calc Af Amer 4 (L) >60 mL/min   Anion gap 18 (H) 5 -  15  CBC with Differential     Status: Abnormal   Collection Time: 03/15/16 10:52 AM  Result Value Ref Range   WBC 3.9 (L) 4.0 - 10.5 K/uL   RBC 2.08 (L) 4.22 - 5.81 MIL/uL   Hemoglobin 6.4 (LL) 13.0 - 17.0 g/dL   HCT 19.8 (L) 39.0 - 52.0 %   MCV 95.2 78.0 - 100.0 fL   MCH 30.8 26.0 - 34.0 pg   MCHC 32.3 30.0 - 36.0 g/dL   RDW 18.6 (H) 11.5 - 15.5 %   Platelets 102 (L) 150 - 400 K/uL   Neutrophils Relative % 75 %   Lymphocytes Relative 16 %   Monocytes Relative 8 %   Eosinophils Relative 1 %   Basophils Relative 0 %   Neutro Abs 3.0 1.7 - 7.7 K/uL   Lymphs Abs 0.6 (L) 0.7 - 4.0 K/uL   Monocytes Absolute 0.3 0.1 - 1.0 K/uL   Eosinophils Absolute 0.0 0.0 - 0.7 K/uL   Basophils Absolute 0.0 0.0 - 0.1 K/uL   RBC Morphology TEARDROP CELLS   Type and screen [place]     Status: None (Preliminary result)   Collection Time: 03/15/16 10:52 AM  Result Value Ref Range   ABO/RH(D) O POS    Antibody Screen NEG    Sample Expiration 03/18/2016    Unit Number [id number]    Blood Component Type RED CELLS,LR    Unit division 00    Status of Unit ALLOCATED    Transfusion Status OK TO TRANSFUSE    Crossmatch Result Compatible    Unit Number [id number]    Blood Component Type RED CELLS,LR    Unit division 00    Status of Unit ISSUED,FINAL    Transfusion Status OK TO TRANSFUSE    Crossmatch Result Compatible    Unit Number [id number]    Blood Component Type RED CELLS,LR    Unit division 00    Status of Unit ALLOCATED    Transfusion Status OK TO TRANSFUSE    Crossmatch Result Compatible   POC occult blood, ED Provider will collect     Status: Abnormal   Collection Time: 03/15/16 11:16 AM  Result Value Ref Range   Fecal Occult Bld POSITIVE (A) NEGATIVE  Prepare RBC     Status: None   Collection Time: 03/15/16 12:40 PM  Result Value Ref Range   Order Confirmation ORDER PROCESSED BY BLOOD BANK   Prepare RBC     Status: None   Collection Time:  03/15/16  6:39  PM  Result Value Ref Range   Order Confirmation BB SAMPLE OR UNITS ALREADY AVAILABLE   CBC     Status: Abnormal   Collection Time: 03/15/16  9:36 PM  Result Value Ref Range   WBC 4.2 4.0 - 10.5 K/uL   RBC 2.41 (L) 4.22 - 5.81 MIL/uL   Hemoglobin 7.3 (L) 13.0 - 17.0 g/dL   HCT 22.7 (L) 39.0 - 52.0 %   MCV 94.2 78.0 - 100.0 fL   MCH 30.3 26.0 - 34.0 pg   MCHC 32.2 30.0 - 36.0 g/dL   RDW 19.2 (H) 11.5 - 15.5 %   Platelets 113 (L) 150 - 400 K/uL  Lactate dehydrogenase     Status: Abnormal   Collection Time: 03/15/16  9:36 PM  Result Value Ref Range   LDH 379 (H) 98 - 192 U/L  Comprehensive metabolic panel     Status: Abnormal   Collection Time: 03/16/16  6:37 AM  Result Value Ref Range   Sodium 136 135 - 145 mmol/L   Potassium 6.4 (HH) 3.5 - 5.1 mmol/L   Chloride 93 (L) 101 - 111 mmol/L   CO2 25 22 - 32 mmol/L   Glucose, Bld 94 65 - 99 mg/dL   BUN 108 (H) 6 - 20 mg/dL   Creatinine, Ser 17.48 (H) 0.61 - 1.24 mg/dL   Calcium 8.5 (L) 8.9 - 10.3 mg/dL   Total Protein 7.9 6.5 - 8.1 g/dL   Albumin 3.1 (L) 3.5 - 5.0 g/dL   AST 30 15 - 41 U/L   ALT 11 (L) 17 - 63 U/L   Alkaline Phosphatase 48 38 - 126 U/L   Total Bilirubin 0.8 0.3 - 1.2 mg/dL   GFR calc non Af Amer 3 (L) >60 mL/min   GFR calc Af Amer 4 (L) >60 mL/min   Anion gap 18 (H) 5 - 15  Glucose, capillary     Status: None   Collection Time: 03/16/16  8:06 AM  Result Value Ref Range   Glucose-Capillary 76 65 - 99 mg/dL     Ct Abdomen Pelvis Wo Contrast  Result Date: 03/16/2016 CLINICAL DATA:  Hematochezia. EXAM: CT ABDOMEN AND PELVIS WITHOUT CONTRAST TECHNIQUE: Multidetector CT imaging of the abdomen and pelvis was performed following the standard protocol without IV contrast. COMPARISON:  02/01/2016 FINDINGS: Lower chest: Unchanged cardiomegaly. There is a small pericardial effusion measuring heterogeneous density, unchanged in size and appearance from prior exam. Trace left pleural thickening/fluid. Minimal left lower lobe  atelectasis. Hepatobiliary: No evidence for focal lesion allowing for lack contrast. Gallbladder is physiologically distended. Probable gallbladder wall thickening. No calcified stone. Pancreas: Not well-defined given lack contrast and mesenteric edema. No evidence of ductal dilatation or inflammation. Spleen: Borderline splenomegaly again seen, spleen measures 12.3 cm. No gross focal in Adrenals/Urinary Tract: No evidence of adrenal nodule. Mild thinning of renal parenchyma without hydronephrosis. Patient with end-stage renal disease on dialysis. Urinary bladder is minimally distended Stomach/Bowel: Suboptimally defined given lack of enteric contrast and mesenteric edema. Stomach is physiologically distended. Enteric suture line in the right lower quadrant, patient is post ileus seek ectomy. Contrast within a few scattered colonic diverticula. There is probable wall thickening involving the transverse colon, and possibly descending and sigmoid colon to a lesser extent. No bowel dilatation to suggest obstruction. Vascular/Lymphatic: IVC appears prominent. Edema of the intra-abdominal fat limits assessment for retroperitoneal and abdominal adenopathy. Prominent inguinal nodes are unchanged from prior. Reproductive: Prostate is unremarkable. Soft tissue  density in the left inguinal canal may be inguinal testis or complex fluid. Other: Diffuse infiltration of intra-abdominal and subcutaneous fat. Probable minimal free fluid in the abdomen and pelvis. No evidence of free air. Musculoskeletal: There are no acute or suspicious osseous abnormalities. IMPRESSION: 1. Probable colonic wall thickening, suggesting colitis which may be infectious or inflammatory. Reduce sensitivity given lack of contrast, paucity of intra- abdominal fat and mesenteric edema. 2. Mesenteric and body wall edema, chronic. 3. Small pericardial effusion. 4. Soft tissue density in the left inguinal canal may be heterogeneous fluid or inguinal testis.  Recommend correlation with physical exam. 5. Question of gallbladder wall thickening. If there is concern for hepatobiliary pathology, right upper quadrant ultrasound recommended. Electronically Signed   By: Jeb Levering M.D.   On: 03/16/2016 01:06   Dg Abd 1 View  Result Date: 03/15/2016 CLINICAL DATA:  Abdominal pain EXAM: ABDOMEN - 1 VIEW COMPARISON:  CT abdomen and pelvis February 01, 2016 FINDINGS: The there is moderate stool in the colon. There is postoperative change in the right mid abdomen. There is no bowel dilatation or air-fluid level suggesting bowel obstruction. No free air. There are small phleboliths in the pelvis. IMPRESSION: No bowel obstruction or free air. Postoperative change right mid abdomen. Electronically Signed   By: Lowella Grip III M.D.   On: 03/15/2016 11:53    ROS:  As stated above in the HPI otherwise negative.  Blood pressure (!) 154/87, pulse 66, temperature 98.2 F (36.8 C), temperature source Oral, resp. rate 19, height 5\' 7"  (1.702 m), weight 68.7 kg (151 lb 6.4 oz), SpO2 98 %.    PE: Gen: NAD, Alert and Oriented HEENT:  [place]/AT, EOMI Neck: Supple, no LAD Lungs: CTA Bilaterally CV: RRR without M/G/R ABM: he did not want to be touch in the abdomen. Ext: No C/C/E  Assessment/Plan: 1) Hematochezia. 2) ? Right sided colitis. 3) Anemia. 4) ESRD - TTS   He has a wide range for his baseline anemia, 7-12 over the past year, but over the last 4 months it was from 7-8.  He denies any further hematochezia since his admission.  I believe he requires a colonoscopy, but he does not want to make any decision at this time.  He states that he did not sleep well and he is too sleepy to make a decision at this time.  Plan: 1) I will reassess tomorrow, but his colonoscopy will not be performed until Saturday.  Bion Todorov D 03/16/2016, 9:00 AM

## 2016-03-18 NOTE — Progress Notes (Signed)
CRITICAL VALUE ALERT  Critical value received: Hgb 6.7 Date of notification: 03/18/16 Time of notification: 0630 Critical value read back: yes Nurse who received alert: Candise Bowens, RN MD notified: yes

## 2016-03-19 DIAGNOSIS — N63 Unspecified lump in unspecified breast: Secondary | ICD-10-CM

## 2016-03-19 LAB — TYPE AND SCREEN
ABO/RH(D): O POS
Antibody Screen: NEGATIVE
Unit division: 0
Unit division: 0
Unit division: 0
Unit division: 0
Unit division: 0

## 2016-03-19 LAB — CBC
HCT: 23.5 % — ABNORMAL LOW (ref 39.0–52.0)
Hemoglobin: 7.5 g/dL — ABNORMAL LOW (ref 13.0–17.0)
MCH: 30.7 pg (ref 26.0–34.0)
MCHC: 31.9 g/dL (ref 30.0–36.0)
MCV: 96.3 fL (ref 78.0–100.0)
Platelets: 158 10*3/uL (ref 150–400)
RBC: 2.44 MIL/uL — ABNORMAL LOW (ref 4.22–5.81)
RDW: 18.2 % — ABNORMAL HIGH (ref 11.5–15.5)
WBC: 4 10*3/uL (ref 4.0–10.5)

## 2016-03-19 LAB — BASIC METABOLIC PANEL
Anion gap: 10 (ref 5–15)
BUN: 52 mg/dL — ABNORMAL HIGH (ref 6–20)
CO2: 27 mmol/L (ref 22–32)
Calcium: 8.1 mg/dL — ABNORMAL LOW (ref 8.9–10.3)
Chloride: 98 mmol/L — ABNORMAL LOW (ref 101–111)
Creatinine, Ser: 14.53 mg/dL — ABNORMAL HIGH (ref 0.61–1.24)
GFR calc Af Amer: 4 mL/min — ABNORMAL LOW (ref >60)
GFR calc non Af Amer: 4 mL/min — ABNORMAL LOW (ref >60)
Glucose, Bld: 99 mg/dL (ref 65–99)
Potassium: 4.3 mmol/L (ref 3.5–5.1)
Sodium: 135 mmol/L (ref 135–145)

## 2016-03-19 LAB — PREPARE RBC (CROSSMATCH)

## 2016-03-19 LAB — RETICULOCYTES
RBC.: 2.85 MIL/uL — ABNORMAL LOW (ref 4.22–5.81)
Retic Count, Absolute: 182.4 10*3/uL (ref 19.0–186.0)
Retic Ct Pct: 6.4 % — ABNORMAL HIGH (ref 0.4–3.1)

## 2016-03-19 LAB — GLUCOSE, CAPILLARY: Glucose-Capillary: 75 mg/dL (ref 65–99)

## 2016-03-19 LAB — SAVE SMEAR

## 2016-03-19 LAB — DIRECT ANTIGLOBULIN TEST (NOT AT ARMC)
DAT, IgG: NEGATIVE
DAT, complement: NEGATIVE

## 2016-03-19 LAB — MRSA PCR SCREENING: MRSA by PCR: POSITIVE — AB

## 2016-03-19 MED ORDER — DOXERCALCIFEROL 4 MCG/2ML IV SOLN
INTRAVENOUS | Status: AC
  Administered 2016-03-19: 4 ug via INTRAVENOUS
  Filled 2016-03-19: qty 2

## 2016-03-19 MED ORDER — HYDROXYZINE HCL 25 MG PO TABS
25.0000 mg | ORAL_TABLET | Freq: Once | ORAL | Status: AC
  Administered 2016-03-19: 25 mg via ORAL
  Filled 2016-03-19: qty 1

## 2016-03-19 MED ORDER — LEVETIRACETAM 250 MG PO TABS
250.0000 mg | ORAL_TABLET | Freq: Once | ORAL | Status: AC
  Administered 2016-03-19: 15:00:00 via ORAL
  Filled 2016-03-19: qty 1

## 2016-03-19 MED ORDER — ALTEPLASE 2 MG IJ SOLR: 2.0000 mg | Freq: Once | INTRAMUSCULAR | Status: DC | PRN

## 2016-03-19 MED ORDER — HYDROCODONE-ACETAMINOPHEN 5-325 MG PO TABS
ORAL_TABLET | ORAL | Status: AC
  Administered 2016-03-19: 2 via ORAL
  Filled 2016-03-19: qty 2

## 2016-03-19 MED ORDER — SODIUM CHLORIDE 0.9 % IV SOLN: 100.0000 mL | INTRAVENOUS | Status: DC | PRN

## 2016-03-19 MED ORDER — LIDOCAINE HCL (PF) 1 % IJ SOLN: 5.0000 mL | INTRAMUSCULAR | Status: DC | PRN

## 2016-03-19 MED ORDER — LIDOCAINE-PRILOCAINE 2.5-2.5 % EX CREA: 1.0000 "application " | TOPICAL_CREAM | CUTANEOUS | Status: DC | PRN

## 2016-03-19 MED ORDER — SODIUM CHLORIDE 0.9 % IV SOLN: Freq: Once | INTRAVENOUS | Status: DC

## 2016-03-19 MED ORDER — PENTAFLUOROPROP-TETRAFLUOROETH EX AERO: 1.0000 "application " | INHALATION_SPRAY | CUTANEOUS | Status: DC | PRN

## 2016-03-19 MED ORDER — HEPARIN SODIUM (PORCINE) 1000 UNIT/ML DIALYSIS: 1000.0000 [IU] | INTRAMUSCULAR | Status: DC | PRN

## 2016-03-19 NOTE — Progress Notes (Addendum)
   Subjective: Jim Wyatt was seen and evaluated today during HD. Fiance was present at bedside in room. She reports she was able to convince him to do HD today as he initially refused. Pt denied any complaints other than feeling fatigued. Denied any more episodes of bright red blood in stool.   Objective:  Vital signs in last 24 hours: Vitals:   03/18/16 1543 03/18/16 2253 03/18/16 2332 03/19/16 0603  BP: 112/66 134/85 (!) 142/83 114/64  Pulse: 70  82 79  Resp: 19  16 16   Temp:   98.9 F (37.2 C) 98.1 F (36.7 C)  TempSrc:   Oral Oral  SpO2: 98%  94% 92%  Weight:   144 lb (65.3 kg)   Height:       General: Chronically-ill appearing male laying in bed with eyes closed during HD. Not open to examination however was in no acute distress. Resting comfortably.  Cardiovascular: Regular rate and rhythm. No murmur or rub appreciated. Pulmonary: CTA BL on anterior chest. Unlabored breathing.  Abdomen: Soft. No guarding or rigidity. +bowel sounds.  Extremities: No peripheral edema noted BL. Intact distal pulses. Skin: Warm, dry. No cyanosis.   Colonoscopy 03/18/16: Fair prep but exam without active bleeding. Large non-thrombosed external hemorrhoids and internal hemorrhoids. Patchy area of mucosa found at the anastomosis with areas of erythema and a few dark purple patches, one of which had a ?fistulous tract. A few diverticuli in left colon.   Assessment/Plan:  Principal Problem:   GI bleed Active Problems:   Human immunodeficiency virus (HIV) disease ([place])   Anemia due to end stage renal disease ([facility])   ESRD (end stage renal disease) ([facility])   Seizure disorder ([facility])   Hyperkalemia   Rectal bleeding  GI bleeding: Colonoscopy yesterday with above findings. Pt denied any further rectal bleeding since scope yesterday. Still continues to complain of fatigue however this has improved.  -GI on board and we continue to appreciate their recommendations -Hb 7.5 today, did not receive unit  ordered yesterday as he did not have HD. Will still transfuse 1 unit and f/u post-transfusion H&H.  ESRD on HD (TuThrSat): Pt initially refused HD today however eventually complied. Continue to appreciate nephro recs  Seizure disorder: on Keppra 500 mg daily and 250 mg after dialysis. Pt missed his scheduled HD session yesterday 2/2 colonoscopy -- have ordered 250 mg today after HD  Dime sized mobile mass under left nipple: Palpable.  -[place] left breast/axilla ordered  Depression: On Effexor  Dispo: Anticipated discharge in approximately 1 day(s).   Jim Manfredi, DO 03/19/2016, 7:50 AM Pager: [phone]

## 2016-03-19 NOTE — Progress Notes (Signed)
Patient arrived to unit per bed.  Reviewed treatment plan and this RN agrees.  Report received from bedside RN, Rodena Piety.  Consent verified.  Patient A & O X 4. Lung sounds clear to ausculation in all fields. No edema. Cardiac: NSR.  Prepped LLAVF with alcohol and cannulated with two 15 gauge needles.  Pulsation of blood noted.  Flushed access well with saline per protocol.  Connected and secured lines and initiated tx at 1006.  UF goal of 600 mL and net fluid removal of 100 mL.  Will continue to monitor.

## 2016-03-19 NOTE — Progress Notes (Signed)
Back to room from HD. Lab here to draw labs. Pt refused.  Dr.Molt notified.

## 2016-03-19 NOTE — Progress Notes (Signed)
  Date: 03/19/2016  Patient name: Jim Wyatt  Medical record number: [id number]  Date of birth: December 27, 1980   I have seen and evaluated this patient and I have discussed the plan of care with the house staff. Please see Dr. Rober Minion note for complete details. I concur with her findings with the following additions/corrections:   Dr. Danford Bad and I also discussed on rounds this morning further investigating Jim Wyatt persistent elevated LDH and low haptoglobin in the setting of a normal bilirubin.  I think he should be evaluated for a hemolytic process as well, and Dr. Danford Bad will start that process.   Sid Falcon, MD 03/19/2016, 1:08 PM

## 2016-03-19 NOTE — Progress Notes (Signed)
OT Cancellation Note  Patient Details Name: Jim Wyatt MRN: [id number] DOB: May 17, 1980   Cancelled Treatment:    Reason Eval/Treat Not Completed: Patient at procedure or test/ unavailable--pt in hemodialysis. Will re-attempt eval at a later time.  Almon Register [id number] 03/19/2016, 12:12 PM

## 2016-03-19 NOTE — Progress Notes (Signed)
Dialysis treatment completed.  1100 mL ultrafiltrated and net fluid removal 100 mL.    Patient status unchanged. Lung sounds clear to ausculation in all fields. No edema. Cardiac: NSR.  Disconnected lines and removed needles.  Pressure held for 10 minutes and band aid/gauze dressing applied.  Report given to bedside RN, Rodena Piety.

## 2016-03-19 NOTE — Procedures (Signed)
I have seen and examined this patient and agree with the plan of care   BP 130/70     Ca 8.1   Hb 7.5   [name] W 03/19/2016, 12:13 PM

## 2016-03-20 ENCOUNTER — Encounter (HOSPITAL_COMMUNITY): Payer: Self-pay | Admitting: Gastroenterology

## 2016-03-20 DIAGNOSIS — K625 Hemorrhage of anus and rectum: Secondary | ICD-10-CM

## 2016-03-20 DIAGNOSIS — Z79899 Other long term (current) drug therapy: Secondary | ICD-10-CM

## 2016-03-20 DIAGNOSIS — Z841 Family history of disorders of kidney and ureter: Secondary | ICD-10-CM

## 2016-03-20 DIAGNOSIS — Z8249 Family history of ischemic heart disease and other diseases of the circulatory system: Secondary | ICD-10-CM

## 2016-03-20 DIAGNOSIS — Z888 Allergy status to other drugs, medicaments and biological substances status: Secondary | ICD-10-CM

## 2016-03-20 DIAGNOSIS — Z9889 Other specified postprocedural states: Secondary | ICD-10-CM

## 2016-03-20 DIAGNOSIS — F339 Major depressive disorder, recurrent, unspecified: Secondary | ICD-10-CM

## 2016-03-20 DIAGNOSIS — Z833 Family history of diabetes mellitus: Secondary | ICD-10-CM

## 2016-03-20 LAB — CBC
HCT: 26.9 % — ABNORMAL LOW (ref 39.0–52.0)
Hemoglobin: 8.6 g/dL — ABNORMAL LOW (ref 13.0–17.0)
MCH: 30.7 pg (ref 26.0–34.0)
MCHC: 32 g/dL (ref 30.0–36.0)
MCV: 96.1 fL (ref 78.0–100.0)
Platelets: 164 10*3/uL (ref 150–400)
RBC: 2.8 MIL/uL — ABNORMAL LOW (ref 4.22–5.81)
RDW: 18.4 % — ABNORMAL HIGH (ref 11.5–15.5)
WBC: 3.5 10*3/uL — ABNORMAL LOW (ref 4.0–10.5)

## 2016-03-20 LAB — TYPE AND SCREEN
Blood Product Expiration Date: 201802022359
ISSUE DATE / TIME: 201801211100
Unit Type and Rh: 5100

## 2016-03-20 LAB — GLUCOSE, CAPILLARY: Glucose-Capillary: 109 mg/dL — ABNORMAL HIGH (ref 65–99)

## 2016-03-20 MED ORDER — HYDROCORTISONE ACETATE 25 MG RE SUPP
25.0000 mg | Freq: Two times a day (BID) | RECTAL | Status: DC
  Filled 2016-03-20: qty 1

## 2016-03-20 NOTE — Progress Notes (Signed)
   Subjective: Jim Wyatt was seen and evaluated today at bedside. Reports his fatigue has improved. Denied any more bloody bowel movements. Wants to go home but does not feel ready.   Objective:  Vital signs in last 24 hours: Vitals:   03/19/16 1903 03/19/16 2115 03/20/16 0409 03/20/16 0953  BP: 130/66 (!) 113/55 115/61 (!) 128/55  Pulse: 79 78 72 72  Resp: 16 17 18 16   Temp: 97.5 F (36.4 C) 97.9 F (36.6 C) 98.5 F (36.9 C) 99.1 F (37.3 C)  TempSrc: Oral Oral Oral Oral  SpO2: 98% 94% 92% 93%  Weight:   145 lb 1.6 oz (65.8 kg)   Height:       General: Chronically ill appearing male resting in bed, In no acute distress. More open to exam than previously however still guarded.  HENT: No conjunctival injection, icterus or ptosis. Oropharynx clear, mucous membranes moist. No mucosal lesions appreciated.  Cardiovascular: Regular rate and rhythm. No murmur or rub appreciated. Pulmonary: CTA BL, no wheezing on anterior chest. Unlabored breathing.  Abdomen: Soft, non-tender and non-distended.+bowel sounds.  Skin: Warm, dry. No cyanosis. Numerous scattered hyperpigmented patches.   Assessment/Plan:  Principal Problem:   GI bleed Active Problems:   Human immunodeficiency virus (HIV) disease ([facility])   Anemia due to end stage renal disease ([facility])   ESRD (end stage renal disease) ([facility])   Seizure disorder ([facility])   Hyperkalemia   Rectal bleeding   Breast lump or mass  GI Bleeding: Denied any BRBPR. Hb 8.5 today after transfusion of 1 unit during HD yesterday. Still continues to complain of fatigue but has improved.  -GI on and we continue to appreciate their recommendations -Awaiting surgical pathology from colonoscopy  -Have added anusol sup and stool softeners for hemorrhoids  Anemia: Working up other sources of pts chronic anemia. ^LDH + decreased haptoglobin suggest a hemolytic process. Coombs negative. Awaiting remainder of labs -Anti-GBM -ANCA titers -Protein  electrophoresis -Saved smear  ESRD on HD (TuThrSat): Dialyzed yesterday without apparent complication. Continue to appreciate nephrology's assistance in management of this patient  Seizure disorder: Keppra PO  Dime sized mobile mass under left nipple: Ordered breast US yesterday however hospital unable to complete. Will be ordered as an outpatient  Depression: on effexor. Seen by psychiatry today. Continue to appreciate their recs.   Dispo: Anticipated discharge in approximately 1 day(s).   Primus Gritton, DO 03/20/2016, 2:00 PM Pager: [phone]

## 2016-03-20 NOTE — Care Management Important Message (Signed)
Important Message  Patient Details  Name: VUK SKILLERN MRN: [id number] Date of Birth: June 16, 1980   Medicare Important Message Given:  Yes    Ahtziry Saathoff, Rory Percy, RN 03/20/2016, 10:28 AM

## 2016-03-20 NOTE — Progress Notes (Signed)
  Date: 03/20/2016  Patient name: Jim Wyatt  Medical record number: [id number]  Date of birth: 1980/08/12   I have seen and evaluated this patient and I have discussed the plan of care with the house staff. Please see Dr. Rober Minion note for complete details. I concur with her findings.    Sid Falcon, MD 03/20/2016, 3:59 PM

## 2016-03-20 NOTE — Consult Note (Signed)
[place] [facility] Consult   Reason for Consult:  Depression and chronic medical probolems Referring Physician:  Dr. Daryll Drown Patient Identification: Jim Wyatt MRN:  [id number] Principal Diagnosis: GI bleed Diagnosis:   Patient Active Problem List   Diagnosis Date Noted  . Breast lump or mass [N63.0]   . Rectal bleeding [K62.5]   . GI bleed [K92.2] 03/15/2016  . Hyperkalemia [E87.5]   . New onset seizure ([place]) [R56.9] 03/13/2016  . Symptomatic anemia [D64.9] 02/01/2016  . Dysuria [R30.0] 01/18/2016  . Hematochezia [K92.1] 01/09/2016  . Postoperative anemia due to acute blood loss [D62] 01/09/2016  . Seizure disorder ([place]) [C94.709] 12/29/2015  . Intussusception ([place]) [K56.1] 12/28/2015  . Hypertension [I10] 12/28/2015  . Otitis externa of both ears [H60.93] 11/21/2015  . AIDS (acquired immunodeficiency syndrome), CD4 <=200 ([place]) [B20]   . Meningitis [G03.9]   . Cephalalgia [R51]   . Blurry vision, bilateral [H53.8]   . Headache [R51]   . Hypertensive urgency [I16.0]   . New onset of headache in immunocompromised patient ([place]) Eurydice.[place], D84.9] 07/06/2015  . ESRD on dialysis ([place]) [N18.6, Z99.2] 04/27/2015  . CAP (community acquired pneumonia) [J18.9] 02/25/2015  . ESRD (end stage renal disease) ([place]) [N18.6] 02/16/2015  . History of DVT (deep vein thrombosis) [Z86.718] 01/12/2015  . Candidal esophagitis ([place]) [B37.81]   . Constipation [K59.00]   . Nausea without vomiting [R11.0] 12/20/2014  . Abscess of right thigh [L02.415] 12/19/2014  . Chronic kidney disease (CKD), stage IV (severe) ([place]) [N18.4] 12/19/2014  . 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 Anemia due to end stage renal disease ([place]) [N18.6, D63.1] 12/19/2014  . Hyponatremia [E87.1] 12/19/2014  . Community acquired pneumonia [J18.9] 07/30/2014  . Anemia of chronic disease [D63.8] 07/30/2014  . Acute on chronic renal failure (HCC) [N17.9, N18.9]   . 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 Human immunodeficiency virus (HIV) disease ([place]) [B20] 12/11/2013  . CMV (cytomegalovirus infection) ([place]) [B25.9] 12/07/2013  . Protein-calorie malnutrition, severe ([place]) [E43] 12/06/2013  . 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 DVT (deep venous thrombosis) ([place]) [I82.409] 11/24/2013  . Asthma, chronic [J45.909] 11/24/2013  . Acquired immune deficiency syndrome ([place]) [B20] 11/14/2013  . AKI (acute kidney injury) ([place]) [N17.9] 11/11/2013  . Elevated liver enzymes [R74.8] 11/09/2013  . History of shingles [Z86.19] 11/09/2013  . Obesity [E66.9] 11/09/2013  . Abdominal pain [R10.9] 11/07/2013  . Asthma [J45.909]     Total Time spent with patient: 1 hour  Subjective:   Jim Wyatt is a 36 y.o. male patient admitted with GI bleed and depression.  HPI:  Jim Wyatt is a 36 year old male with medical history significant for HIV with intermittent ART compliance, ESRD on HD, recent intussusception requiring ileocolic resection 62/83 with subsequent lower GI bleed and hospitalization and transfusion who presents for evaluation of a 2 day history of BRBPR and generalized abdominal pain.   Patient seen, chart reviewed, and case discussed with staff RN for this face to face psychiatric consultation and evaluation of depression and treatment needs. Patient appeared lying on his bed, with being tired, fatigue, no interest to participate with physical therapy and asking them to come after 9 PM. 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 He has been staying in bed and has less physical and mental activity. He is refusing to participate in daily hospital care. Patient denied being depressed and taking antidepressant medication when  asked. He has limited insight into his mental health problems. He denied suicide or homicide ideation, intention or plans.   Past Psychiatric History: History of  depression and no history of acute psych admissions.  Risk to Self: Is patient at risk for suicide?: No Risk to Others:   Prior Inpatient Therapy:   Prior Outpatient Therapy:    Past Medical History:  Past Medical History:  Diagnosis Date  . Asthma   . CAP (community acquired pneumonia) 10/2013  . Cellulitis 11/2013   LLE  . DVT (deep venous thrombosis) ([place]) 10/2013   BLE  . ESRD on hemodialysis ([place]) 01/2015   biopsy proven FSGS with ATN on renal bx from Sept 2015. Went on HD Dec 2016.   [name] Fever   . Headache   . HIV infection ([place]) dx'd 10/2013  . Hypertension   . Leg pain   . Multiple environmental allergies    "trees, dogs, cats"  . Multiple food allergies   . Shingles < 2010  . 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 AV FISTULA PLACEMENT Left 03/03/2015   Procedure: Creation of Left Arm RADIOCEPHALIC ARTERIOVENOUS FISTULA ;  Surgeon: Mal Misty, MD;  Location: [facility];  Service: Vascular;  Laterality: Left;  . COLONOSCOPY N/A 03/18/2016   Procedure: COLONOSCOPY;  Surgeon: Manus Gunning, MD;  Location: [facility];  Service: Gastroenterology;  Laterality: N/A;  . FEMUR FRACTURE SURGERY Right 09/2001  . FRACTURE SURGERY    . I&D EXTREMITY Right 12/18/2014   Procedure: IRRIGATION AND DEBRIDEMENT RIGHT LOWER EXTREMITY;  Surgeon: Ralene Ok, MD;  Location: [facility];  Service: General;  Laterality: Right;  . INSERTION OF DIALYSIS CATHETER  2016  . LAPAROTOMY N/A 12/28/2015   Procedure: EXPLORATORY LAPAROTOMY BOWEL RESECTION ILEOCECECTOMY;  Surgeon: Clovis Riley, MD;  Location: [facility];  Service: General;  Laterality: N/A;   Family History:  Family History  Problem Relation Age of Onset  . Hypertension Mother   . Diabetes Maternal Grandmother   . Kidney disease Maternal Grandmother    Family Psychiatric  History: not contributory Social History:  History  Alcohol Use No    Comment: I stopped drinking  along time ago "     History  Drug Use No    Social History   Social History  . Marital status: Single    Spouse name: N/A  . Number of children: N/A  . Years of education: N/A   Social History Main Topics  . Smoking status: Never Smoker  . Smokeless tobacco: Never Used  . Alcohol use No     Comment: I stopped drinking along time ago "  . Drug use: No  . Sexual activity: Not Asked   Other Topics Concern  . None   Social History Narrative  . None   Additional Social History:    Allergies:   Allergies  Allergen Reactions  . Bactrim [Sulfamethoxazole-Trimethoprim] Other (See Comments)    Renal failure  . Vancomycin Other (See Comments)    Patient states vancomycin caused kidney injury  . Reglan [Metoclopramide] Other (See Comments)    Causes ticks/ jerks  . Amlodipine Palpitations and Other (See Comments)    fatigue  . Augmentin [Amoxicillin-Pot Clavulanate] Hives    Labs:  Results for orders placed or performed during the hospital encounter of 03/15/16 (from the past 48 hour(s))  Prepare RBC     Status: None   Collection Time: 03/18/16 12:47 PM  Result Value Ref Range   Order Confirmation      ORDER PROCESSED BY BLOOD BANK BB SAMPLE OR UNITS ALREADY AVAILABLE  CBC     Status: Abnormal   Collection Time: 03/19/16  4:47 AM  Result Value Ref Range   WBC 4.0 4.0 - 10.5 K/uL   RBC 2.44 (L) 4.22 - 5.81 MIL/uL   Hemoglobin 7.5 (L) 13.0 - 17.0 g/dL   HCT 23.5 (L) 39.0 - 52.0 %   MCV 96.3 78.0 - 100.0 fL   MCH 30.7 26.0 - 34.0 pg   MCHC 31.9 30.0 - 36.0 g/dL   RDW 18.2 (H) 11.5 - 15.5 %   Platelets 158 150 - 400 K/uL  Basic metabolic panel     Status: Abnormal   Collection Time: 03/19/16  4:47 AM  Result Value Ref Range   Sodium 135 135 - 145 mmol/L   Potassium 4.3 3.5 - 5.1 mmol/L   Chloride 98 (L) 101 - 111 mmol/L   CO2 27 22 - 32 mmol/L   Glucose, Bld 99 65 - 99 mg/dL   BUN 52 (H) 6 - 20 mg/dL   Creatinine, Ser 14.53 (H) 0.61 - 1.24 mg/dL   Calcium 8.1  (L) 8.9 - 10.3 mg/dL   GFR calc non Af Amer 4 (L) >60 mL/min   GFR calc Af Amer 4 (L) >60 mL/min    Comment: (NOTE) The eGFR has been calculated using the CKD EPI equation. This calculation has not been validated in all clinical situations. eGFR's persistently <60 mL/min signify possible Chronic Kidney Disease.    Anion gap 10 5 - 15  MRSA PCR Screening     Status: Abnormal   Collection Time: 03/19/16  7:21 AM  Result Value Ref Range   MRSA by PCR POSITIVE (A) NEGATIVE    Comment:        The GeneXpert MRSA Assay (FDA approved for NASAL specimens only), is one component of a comprehensive MRSA colonization surveillance program. It is not intended to diagnose MRSA infection nor to guide or monitor treatment for MRSA infections. RESULT CALLED TO, READ BACK BY AND VERIFIED WITH: A MINTZ,RN AT 0076 03/19/16 BY L BENFIELD   Glucose, capillary     Status: None   Collection Time: 03/19/16  7:51 AM  Result Value Ref Range   Glucose-Capillary 75 65 - 99 mg/dL  Type and screen [place]     Status: None   Collection Time: 03/19/16 10:00 AM  Result Value Ref Range   ISSUE DATE / TIME [id number]    Blood Product Unit Number [id number]    PRODUCT CODE [id number]    Unit Type and Rh 5100    Blood Product Expiration Date [id number]   Prepare RBC     Status: None   Collection Time: 03/19/16 11:00 AM  Result Value Ref Range   Order Confirmation ORDER PROCESSED BY BLOOD BANK   Direct antiglobulin test (not at [facility])     Status: None   Collection Time: 03/19/16 11:51 AM  Result Value Ref Range   DAT, complement NEG    DAT, IgG NEG   Reticulocytes     Status: Abnormal   Collection Time: 03/19/16  6:57 PM  Result Value Ref Range   Retic Ct Pct 6.4 (H) 0.4 - 3.1 %   RBC. 2.85 (L) 4.22 - 5.81 MIL/uL   Retic Count, Manual 182.4 19.0 - 186.0 K/uL  Save smear     Status: None  Collection Time: 03/19/16  6:57 PM  Result Value Ref Range   Smear Review SMEAR STAINED  AND AVAILABLE FOR REVIEW   CBC     Status: Abnormal   Collection Time: 03/20/16  7:15 AM  Result Value Ref Range   WBC 3.5 (L) 4.0 - 10.5 K/uL   RBC 2.80 (L) 4.22 - 5.81 MIL/uL   Hemoglobin 8.6 (L) 13.0 - 17.0 g/dL   HCT 26.9 (L) 39.0 - 52.0 %   MCV 96.1 78.0 - 100.0 fL   MCH 30.7 26.0 - 34.0 pg   MCHC 32.0 30.0 - 36.0 g/dL   RDW 18.4 (H) 11.5 - 15.5 %   Platelets 164 150 - 400 K/uL  Glucose, capillary     Status: Abnormal   Collection Time: 03/20/16  7:43 AM  Result Value Ref Range   Glucose-Capillary 109 (H) 65 - 99 mg/dL    Current Facility-Administered Medications  Medication Dose Route Frequency Provider Last Rate Last Dose  . 0.9 %  sodium chloride infusion   Intravenous Once Burgess Estelle, MD      . 0.9 %  sodium chloride infusion   Intravenous Once Madelon Lips, MD      . abacavir [facility]) tablet 600 mg  600 mg Oral Q supper Burgess Estelle, MD   600 mg at 03/19/16 1644  . acetaminophen (TYLENOL) tablet 650 mg  650 mg Oral Q6H PRN Burgess Estelle, MD   650 mg at 03/17/16 1822   Or  . acetaminophen (TYLENOL) suppository 650 mg  650 mg Rectal Q6H PRN Burgess Estelle, MD      . azithromycin (ZITHROMAX) tablet 1,200 mg  1,200 mg Oral Weekly Bethany Molt, DO   1,200 mg at 03/20/16 1013  . carvedilol (COREG) tablet 25 mg  25 mg Oral BID Burgess Estelle, MD   25 mg at 03/20/16 1012  . dapsone tablet 100 mg  100 mg Oral Daily Burgess Estelle, MD   100 mg at 03/20/16 1013  . [START ON 03/23/2016] Darbepoetin Alfa (ARANESP) injection 200 mcg  200 mcg Intravenous Q Thu-HD Ernest Haber, PA-C   200 mcg at 03/16/16 1603  . darunavir-cobicistat (PREZCOBIX) 800-150 MG per tablet 1 tablet  1 tablet Oral QPM Burgess Estelle, MD   1 tablet at 03/19/16 1643  . diltiazem (CARDIZEM CD) 24 hr capsule 180 mg  180 mg Oral Daily Burgess Estelle, MD   180 mg at 03/20/16 1012  . docusate sodium (COLACE) capsule 100 mg  100 mg Oral Daily PRN Bethany Molt, DO      . dolutegravir (TIVICAY) tablet 50 mg  50 mg Oral  QPM Burgess Estelle, MD   50 mg at 03/19/16 1642  . doxercalciferol (HECTOROL) injection 4 mcg  4 mcg Intravenous Q T,Th,Sa-HD Ernest Haber, PA-C   4 mcg at 03/19/16 1026  . ferric gluconate (NULECIT) 125 mg in sodium chloride 0.9 % 100 mL IVPB  125 mg Intravenous Q Thu-HD Ernest Haber, PA-C   125 mg at 03/16/16 1533  . hydrALAZINE (APRESOLINE) tablet 50 mg  50 mg Oral TID Burgess Estelle, MD   50 mg at 03/20/16 1012  . HYDROcodone-acetaminophen (NORCO/VICODIN) 5-325 MG per tablet 1-2 tablet  1-2 tablet Oral Q6H PRN Burgess Estelle, MD   2 tablet at 03/20/16 0630  . levETIRAcetam (KEPPRA) tablet 250 mg  250 mg Oral Once per day on Tue Thu Sat Sid Falcon, MD   250 mg at 03/16/16 1742  . levETIRAcetam (KEPPRA) tablet 500 mg  500 mg Oral Daily Sid Falcon, MD   500 mg at 03/20/16 1012  . losartan (COZAAR) tablet 100 mg  100 mg Oral QHS Burgess Estelle, MD   100 mg at 03/19/16 2216  . ondansetron (ZOFRAN) tablet 4 mg  4 mg Oral Q6H PRN Burgess Estelle, MD       Or  . ondansetron (ZOFRAN) injection 4 mg  4 mg Intravenous Q6H PRN Burgess Estelle, MD   4 mg at 03/19/16 1658  . senna-docusate (Senokot-S) tablet 1 tablet  1 tablet Oral QHS PRN Burgess Estelle, MD      . sodium chloride flush (NS) 0.9 % injection 3 mL  3 mL Intravenous Q12H Burgess Estelle, MD   3 mL at 03/20/16 1000  . venlafaxine XR (EFFEXOR-XR) 24 hr capsule 75 mg  75 mg Oral Daily Burgess Estelle, MD   75 mg at 03/20/16 1013    Musculoskeletal: Strength & Muscle Tone: decreased Gait & Station: unable to stand Patient leans: N/A  Psychiatric Specialty Exam: Physical Exam as per history and physical  ROS Generalized weakness, fatigue, loss of interest and possible disturbed sleep due to third shift work schedule. No Fever-chills, No Headache, No changes with Vision or hearing, reports vertigo No problems swallowing food or Liquids, No Chest pain, Cough or Shortness of Breath, No Abdominal pain, No Nausea or Vommitting, Bowel movements are  regular, No Blood in stool or Urine, No dysuria, No new skin rashes or bruises, No new joints pains-aches,  No new weakness, tingling, numbness in any extremity, No recent weight gain or loss, No polyuria, polydypsia or polyphagia,  A full 10 point Review of Systems was done, except as stated above, all other Review of Systems were negative.   Blood pressure (!) 128/55, pulse 72, temperature 99.1 F (37.3 C), temperature source Oral, resp. rate 16, height '5\' 7"'  (1.702 m), weight 65.8 kg (145 lb 1.6 oz), SpO2 93 %.Body mass index is 22.73 kg/m.  General Appearance: Guarded  Eye Contact:  Good  Speech:  Clear and Coherent and Slow  Volume:  Decreased  Mood:  Depressed  Affect:  Constricted and Depressed  Thought Process:  Coherent and Goal Directed  Orientation:  Full (Time, Place, and Person)  Thought Content:  WDL  Suicidal Thoughts:  No  Homicidal Thoughts:  No  Memory:  Immediate;   Good Recent;   Fair Remote;   Fair  Judgement:  Poor  Insight:  Fair  Psychomotor Activity:  Psychomotor Retardation  Concentration:  Concentration: Fair and Attention Span: Fair  Recall:  Good  Fund of Knowledge:  Good  Language:  Good  Akathisia:  Negative  Handed:  Right  AIMS (if indicated):     Assets:  Communication Skills Desire for Improvement Financial Resources/Insurance Housing Leisure Time Resilience Transportation  ADL's:  Impaired  Cognition:  WNL  Sleep:        Treatment Plan Summary: This is a 35 years old male with multiple medical problems presented with GI bleed and chronic depression. Patient has limited insight and poor compliance with inpatient treatment and refusing to participate in physical therapy.   Will increase Effexor XR 150 mg daily for better psychomotor activity and depression He has no safety concerns and denied active SI/HI and no evidence of psychosis Daily contact with patient to assess and evaluate symptoms and progress in treatment and  Medication management  Appreciate psychiatric consultation and we sign off as of today Please contact [phone] or [phone]  if needs further assistance  Disposition: No evidence of imminent risk to self or others at present.   Supportive therapy provided about ongoing stressors.  Ambrose Finland, MD 03/20/2016 11:51 AM

## 2016-03-20 NOTE — Progress Notes (Signed)
[facility] ROUNDING NOTE   Subjective:   Interval History:  HD yesterday. Tired but no new c/os  Objective:  Vital signs in last 24 hours:  Temp:  [97.5 F (36.4 C)-99.1 F (37.3 C)] 99.1 F (37.3 C) (01/22 0953) Pulse Rate:  [72-97] 72 (01/22 0953) Resp:  [12-18] 16 (01/22 0953) BP: (113-153)/(55-96) 128/55 (01/22 0953) SpO2:  [92 %-98 %] 93 % (01/22 0953) Weight:  [65.8 kg (145 lb 1.6 oz)-66 kg (145 lb 8.1 oz)] 65.8 kg (145 lb 1.6 oz) (01/22 0409)  Weight change: 0.782 kg (1 lb 11.6 oz) Filed Weights   03/19/16 0958 03/19/16 1351 03/20/16 0409  Weight: 66.1 kg (145 lb 11.6 oz) 66 kg (145 lb 8.1 oz) 65.8 kg (145 lb 1.6 oz)    Intake/Output: I/O last 3 completed shifts: In: 455 [P.O.:120; Blood:335] Out: 200 [Urine:100; Other:100]   Intake/Output this shift:  No intake/output data recorded.  General: chronically-ill appearing AAM NAD CVS- RRR RS- CTA ABD- BS present soft non-distended EXT- no edema Dialysis Access: LUA AVF+bruit   Basic Metabolic Panel:  Recent Labs Lab 03/15/16 1052 03/16/16 0637 03/17/16 0816 03/18/16 0433 03/19/16 0447  NA 135 136 136 134* 135  K 5.5* 6.4* 4.1 4.2 4.3  CL 94* 93* 96* 96* 98*  CO2 23 25 27 25 27   GLUCOSE 84 94 75 82 99  BUN 95* 108* 39* 48* 52*  CREATININE 15.58* 17.48* 10.05* 12.31* 14.53*  CALCIUM 8.1* 8.5* 8.3* 8.4* 8.1*    Liver Function Tests:  Recent Labs Lab 03/15/16 1052 03/16/16 0637 03/17/16 0816 03/18/16 0433  AST 30 30 28 23   ALT 12* 11* 11* 12*  ALKPHOS 44 48 40 34*  BILITOT 0.7 0.8 1.0 0.8  PROT 7.2 7.9 6.8 6.0*  ALBUMIN 2.8* 3.1* 2.5* 2.3*   No results for input(s): LIPASE, AMYLASE in the last 168 hours. No results for input(s): AMMONIA in the last 168 hours.  CBC:  Recent Labs Lab 03/15/16 1052  03/16/16 2100 03/17/16 0816 03/18/16 0433 03/19/16 0447 03/20/16 0715  WBC 3.9*  < > 3.8* 3.7* 2.5* 4.0 3.5*  NEUTROABS 3.0  --   --   --   --   --   --   HGB 6.4*  < >  8.4* 7.3* 6.7* 7.5* 8.6*  HCT 19.8*  < > 25.5* 22.6* 21.0* 23.5* 26.9*  MCV 95.2  < > 93.4 94.2 95.5 96.3 96.1  PLT 102*  < > 110* 99* 117* 158 164  < > = values in this interval not displayed.  Cardiac Enzymes: No results for input(s): CKTOTAL, CKMB, CKMBINDEX, TROPONINI in the last 168 hours.  BNP: Invalid input(s): POCBNP  CBG:  Recent Labs Lab 03/16/16 1021 03/17/16 0805 03/18/16 0831 03/19/16 0751 03/20/16 0743  GLUCAP 100* 70 37 75 109*    Microbiology: Results for orders placed or performed during the hospital encounter of 03/15/16  Culture, blood (routine x 2)     Status: None (Preliminary result)   Collection Time: 03/17/16  7:51 PM  Result Value Ref Range Status   Specimen Description BLOOD RIGHT ANTECUBITAL  Final   Special Requests BOTTLES DRAWN AEROBIC AND ANAEROBIC 10CC  Final   Culture NO GROWTH 2 DAYS  Final   Report Status PENDING  Incomplete  Culture, blood (routine x 2)     Status: None (Preliminary result)   Collection Time: 03/17/16  7:56 PM  Result Value Ref Range Status   Specimen Description BLOOD RIGHT HAND  Final   Special Requests BOTTLES DRAWN AEROBIC ONLY 10CC  Final   Culture NO GROWTH 2 DAYS  Final   Report Status PENDING  Incomplete  MRSA PCR Screening     Status: Abnormal   Collection Time: 03/19/16  7:21 AM  Result Value Ref Range Status   MRSA by PCR POSITIVE (A) NEGATIVE Final    Comment:        The GeneXpert MRSA Assay (FDA approved for NASAL specimens only), is one component of a comprehensive MRSA colonization surveillance program. It is not intended to diagnose MRSA infection nor to guide or monitor treatment for MRSA infections. RESULT CALLED TO, READ BACK BY AND VERIFIED WITH: A MINTZ,RN AT 2831 03/19/16 BY L BENFIELD     Coagulation Studies: No results for input(s): LABPROT, INR in the last 72 hours.  Urinalysis: No results for input(s): COLORURINE, LABSPEC, PHURINE, GLUCOSEU, HGBUR, BILIRUBINUR, KETONESUR,  PROTEINUR, UROBILINOGEN, NITRITE, LEUKOCYTESUR in the last 72 hours.  Invalid input(s): APPERANCEUR    Imaging: No results found.   Medications:    . sodium chloride   Intravenous Once  . sodium chloride   Intravenous Once  . abacavir  600 mg Oral Q supper  . azithromycin  1,200 mg Oral Weekly  . carvedilol  25 mg Oral BID  . dapsone  100 mg Oral Daily  . [START ON 03/23/2016] darbepoetin (ARANESP) injection - DIALYSIS  200 mcg Intravenous Q Thu-HD  . darunavir-cobicistat  1 tablet Oral QPM  . diltiazem  180 mg Oral Daily  . dolutegravir  50 mg Oral QPM  . doxercalciferol  4 mcg Intravenous Q T,Th,Sa-HD  . ferric gluconate (FERRLECIT/NULECIT) IV  125 mg Intravenous Q Thu-HD  . hydrALAZINE  50 mg Oral TID  . levETIRAcetam  250 mg Oral Once per day on Tue Thu Sat  . levETIRAcetam  500 mg Oral Daily  . losartan  100 mg Oral QHS  . sodium chloride flush  3 mL Intravenous Q12H  . venlafaxine XR  75 mg Oral Daily   acetaminophen **OR** acetaminophen, docusate sodium, HYDROcodone-acetaminophen, ondansetron **OR** ondansetron (ZOFRAN) IV, senna-docusate   Dialysis Orders: Center: [facility] on [facility] . EDW 66 kg HD Bath 2k, 2ca  Time 3hrs 72min Heparin 2,000. Access LUA AVF     Hec4 mcg IV/HD   mircera 233mcg q 2wks  Hd  Last on    Units IV/HD   Other op labs hgb 8.3  03/09/16 ca 9 phos 7.4  pth 942  TFS29%    Assessment/ Plan:  1. Hyperkalemia: Resolved with HD  2. ESRD - [facility]. 2.0 K bath. No heparin.  3. Lower GI Bleed: w/u per admit team/ s/p colonoscopy 03/18/16 -no active bleeding, external hemorrhoids, patchy mucosa ?fistulous tract biopsied, few diverticula   4. Hypertension/volume - BP up on admit , now controlled on meds (in past ??compliance Noted bp down with admits and up at op kid center). Now at EDW Next HD tomorrow - UF to dry  5. Anemia -HGB 8.6 trending up   Max ESA at OP unit last dose 03/09/16. Rec'd 2 unit of PRBCs since adm. Tranfuse on HD if HGB < 7.0 6. Metabolic  bone disease - Hec q hd binder when pos 7. HIV -meds per admit 8. Nutrition: Clear liq diet.  9. Seizures: H/O seizure disorder. Had seizure yesterday. Has been restarted on Keppra. No seizure activity today.     Jim Child PA-C [facility] Pager [phone] 03/20/2016,10:33 AM  Pt seen, examined and agree w A/P as above.  Kelly Splinter MD [employer] pager [phone]   03/20/2016, 4:23 PM

## 2016-03-20 NOTE — Progress Notes (Signed)
OT NOTE  Pt requesting OT to return(8 am) reporting he was fatigued and was up til 2 AM. OT agreeable and established a POC to complete oral care after 10 AM  OT returning at 11AM and encouraging patient to complete OT eval. Pt reports "come back another time. " pt educated this is the second attempt and to participate. Pt declined. RN notified.   Rn reports pt is making transfers to bathroom level .   Pt has HD T TH SAT during AM hours and works third shift otherwise.   Pt encouraged to participate and educated on multiple attempts by OT to evaluate. Pt just refused.    Jeri Modena   OTR/L Pager: [phone] Office: [phone] .

## 2016-03-20 NOTE — Progress Notes (Signed)
PT Cancellation Note  Patient Details Name: Jim Wyatt MRN: [id number] DOB: 1980-08-20   Cancelled Treatment:    Reason Eval/Treat Not Completed: Other (comment) (Was in BR every attempt PT made).  PT waited to come for therapy after 11 as requested by pt and still was not able to get him for therapy.  Can try later as time and pt allow.   Ramond Dial 03/20/2016, 12:10 PM   Mee Hives, PT MS Acute Rehab Dept. Number: [place]

## 2016-03-21 ENCOUNTER — Telehealth: Payer: Self-pay | Admitting: Internal Medicine

## 2016-03-21 LAB — ANCA TITERS
Atypical P-ANCA titer: <1:20 {titer}
C-ANCA: <1:20 {titer}
P-ANCA: <1:20 {titer}

## 2016-03-21 LAB — PROTEIN ELECTROPHORESIS, SERUM
A/G Ratio: 0.9 (ref 0.7–1.7)
Albumin ELP: 3.1 g/dL (ref 2.9–4.4)
Alpha-1-Globulin: 0.2 g/dL (ref 0.0–0.4)
Alpha-2-Globulin: 0.4 g/dL (ref 0.4–1.0)
Beta Globulin: 0.8 g/dL (ref 0.7–1.3)
Gamma Globulin: 2 g/dL — ABNORMAL HIGH (ref 0.4–1.8)
Globulin, Total: 3.3 g/dL (ref 2.2–3.9)
PDF: 0
Total Protein ELP: 6.4 g/dL (ref 6.0–8.5)

## 2016-03-21 LAB — RENAL FUNCTION PANEL
Albumin: 2.1 g/dL — ABNORMAL LOW (ref 3.5–5.0)
Anion gap: 7 (ref 5–15)
BUN: 27 mg/dL — ABNORMAL HIGH (ref 6–20)
CO2: 27 mmol/L (ref 22–32)
Calcium: 8.5 mg/dL — ABNORMAL LOW (ref 8.9–10.3)
Chloride: 100 mmol/L — ABNORMAL LOW (ref 101–111)
Creatinine, Ser: 11.76 mg/dL — ABNORMAL HIGH (ref 0.61–1.24)
GFR calc Af Amer: 6 mL/min — ABNORMAL LOW (ref >60)
GFR calc non Af Amer: 5 mL/min — ABNORMAL LOW (ref >60)
Glucose, Bld: 96 mg/dL (ref 65–99)
Phosphorus: 7.5 mg/dL — ABNORMAL HIGH (ref 2.5–4.6)
Potassium: 3.7 mmol/L (ref 3.5–5.1)
Sodium: 134 mmol/L — ABNORMAL LOW (ref 135–145)

## 2016-03-21 LAB — GLUCOSE, CAPILLARY: Glucose-Capillary: 83 mg/dL (ref 65–99)

## 2016-03-21 LAB — CBC
HCT: 29.9 % — ABNORMAL LOW (ref 39.0–52.0)
Hemoglobin: 9.5 g/dL — ABNORMAL LOW (ref 13.0–17.0)
MCH: 30.6 pg (ref 26.0–34.0)
MCHC: 31.8 g/dL (ref 30.0–36.0)
MCV: 96.5 fL (ref 78.0–100.0)
Platelets: 198 10*3/uL (ref 150–400)
RBC: 3.1 MIL/uL — ABNORMAL LOW (ref 4.22–5.81)
RDW: 18.1 % — ABNORMAL HIGH (ref 11.5–15.5)
WBC: 4.1 10*3/uL (ref 4.0–10.5)

## 2016-03-21 MED ORDER — DOXERCALCIFEROL 4 MCG/2ML IV SOLN
INTRAVENOUS | Status: AC
  Filled 2016-03-21: qty 2

## 2016-03-21 MED ORDER — HYDROCORTISONE ACETATE 25 MG RE SUPP: 25.0000 mg | Freq: Two times a day (BID) | RECTAL | 0 refills | Status: DC

## 2016-03-21 MED ORDER — VENLAFAXINE HCL ER 150 MG PO CP24: 150.0000 mg | ORAL_CAPSULE | Freq: Every day | ORAL | Status: DC

## 2016-03-21 MED ORDER — SUCROFERRIC OXYHYDROXIDE 500 MG PO CHEW: 1000.0000 mg | CHEWABLE_TABLET | Freq: Every morning | ORAL | Status: DC

## 2016-03-21 MED ORDER — VENLAFAXINE HCL ER 150 MG PO CP24: 150.0000 mg | ORAL_CAPSULE | Freq: Every day | ORAL | 1 refills | Status: DC

## 2016-03-21 MED ORDER — HYDRALAZINE HCL 100 MG PO TABS: 50.0000 mg | ORAL_TABLET | Freq: Three times a day (TID) | ORAL | 1 refills | Status: DC

## 2016-03-21 MED ORDER — DOCUSATE SODIUM 100 MG PO CAPS: 100.0000 mg | ORAL_CAPSULE | Freq: Every day | ORAL | 0 refills | Status: DC | PRN

## 2016-03-21 MED ORDER — LEVETIRACETAM 500 MG PO TABS: 500.0000 mg | ORAL_TABLET | Freq: Every day | ORAL | 11 refills | Status: DC

## 2016-03-21 NOTE — Progress Notes (Addendum)
   Subjective:   No acute events overnight. Pt much more alert and interactive this morning- sitting in the chair- working with OT. Had  a bowel movement yesterday, non-bloody, brown. Denies any vomiting.  Feels like he could not sleep, and would like to go home today.   Objective:  Vital signs in last 24 hours: Vitals:   03/20/16 1700 03/20/16 2100 03/20/16 2232 03/21/16 0631  BP: 119/62 (!) 135/55 135/80 (!) 145/83  Pulse: 78 80  76  Resp: 16 16    Temp: 99 F (37.2 C) 98 F (36.7 C)  98.3 F (36.8 C)  TempSrc: Oral Axillary  Oral  SpO2: 92% 92%  96%  Weight:      Height:       General: more interactive today, and sitting in chair Cardiovascular: Regular rate and rhythm. No murmur or rub appreciated. Pulmonary: CTA BL, no wheezing on anterior chest. Unlabored breathing.  Abdomen: Soft, non-tender and non-distended.+bowel sounds.   Assessment/Plan:  Principal Problem:   GI bleed Active Problems:   Human immunodeficiency virus (HIV) disease (HCC)   Anemia due to end stage renal disease (HCC)   ESRD (end stage renal disease) (HCC)   Seizure disorder (HCC)   Hyperkalemia   Rectal bleeding   Breast lump or mass  GI Bleeding: Denied any further episodes of BRBPR. Hb 8.6 1/22 after transfusion of 1 unit during HD. Colonoscopy showed external and internal hemorrhoids and some diverticula. No blood. A patchy area of mucosa at the anastamosis.  HgB is stable today at 9.5 -Awaiting surgical pathology from colonoscopy  -anusol sup and stool softeners for hemorrhoids- to be used for no more than 1 week -high fiber diet   ESRD on HD (TuThrSat):  Continue to appreciate nephrology's assistance in management of this patient -dialysis today  Seizure disorder: Keppra PO   Anemia: Working up other sources of pts chronic anemia. ^LDH + decreased haptoglobin suggest a hemolytic process. Coombs negative. Awaiting remainder of labs -Anti-GBM -ANCA titers -Protein  electrophoresis -Saved smear -These labs to be followed up at hospital follow up appt as they are still pending   Depression: on effexor. Seen by psychiatry . Continue to appreciate their recs.   Dispo: Anticipated discharge today  Burgess Estelle, MD 03/21/2016, 8:44 AM Pager: [phone]

## 2016-03-21 NOTE — Progress Notes (Signed)
  Date: 03/21/2016  Patient name: Jim Wyatt  Medical record number: [id number]  Date of birth: [birth year]   I have seen and evaluated this patient and I have discussed the plan of care with the house staff. Please see Dr. Sherlynn Carbon note for complete details. I concur with his findings.   [name] should be advised to only use topical steroid products for hemorrhoids for 1 week and then reassess need.  They can cause skin thinning and other issues if used for too prolonged a period.   Sid Falcon, MD 03/21/2016, 12:55 PM

## 2016-03-21 NOTE — Progress Notes (Signed)
Jim Wyatt to be D/C'd Home per MD order.  Discussed prescriptions and follow up appointments with the patient. Prescriptions given to patient, medication list explained in detail. Pt verbalized understanding.  Allergies as of 03/21/2016      Reactions   Bactrim [sulfamethoxazole-trimethoprim] Other (See Comments)   Renal failure   Vancomycin Other (See Comments)   Patient states vancomycin caused kidney injury   Reglan [metoclopramide] Other (See Comments)   Causes ticks/ jerks   Amlodipine Palpitations, Other (See Comments)   fatigue   Augmentin [amoxicillin-pot Clavulanate] Hives      Medication List    STOP taking these medications   HYDROcodone-acetaminophen 5-325 MG tablet Commonly known as:  NORCO/VICODIN     TAKE these medications   abacavir 300 MG tablet Commonly known as:  ZIAGEN Take 2 tablets (600 mg total) by mouth daily with supper.   acetaminophen 500 MG tablet Commonly known as:  TYLENOL Take 1,000 mg by mouth every 6 (six) hours as needed (pain).   albuterol 108 (90 Base) MCG/ACT inhaler Commonly known as:  PROVENTIL HFA;VENTOLIN HFA Inhale 2 puffs into the lungs every 6 (six) hours as needed for wheezing or shortness of breath.   azithromycin 600 MG tablet Commonly known as:  ZITHROMAX Take 2 tablets (1,200 mg total) by mouth once a week. On Mondays   CARTIA XT 180 MG 24 hr capsule Generic drug:  diltiazem Take 180 mg by mouth daily.   carvedilol 25 MG tablet Commonly known as:  COREG Take 25 mg by mouth 2 (two) times daily.   Cyanocobalamin 2500 MCG Chew Chew 5,000 mcg by mouth daily.   dapsone 100 MG tablet Take 1 tablet (100 mg total) by mouth daily.   darunavir-cobicistat 800-150 MG tablet Commonly known as:  PREZCOBIX Take 1 tablet by mouth every evening. Swallow whole. Do NOT crush, break or chew tablets. Take with food.   docusate sodium 100 MG capsule Commonly known as:  COLACE Take 1 capsule (100 mg total) by mouth daily as  needed for mild constipation.   dolutegravir 50 MG tablet Commonly known as:  TIVICAY Take 1 tablet (50 mg total) by mouth every evening.   hydrALAZINE 100 MG tablet Commonly known as:  APRESOLINE Take 0.5 tablets (50 mg total) by mouth 3 (three) times daily. 6:30am, 2:30pm, 11pm - for high blood pressure What changed:  when to take this   hydrocortisone 25 MG suppository Commonly known as:  ANUSOL-HC Place 1 suppository (25 mg total) rectally 2 (two) times daily. For no more than 7 days   levETIRAcetam 250 MG tablet Commonly known as:  KEPPRA Take 1 tablet (250 mg) by mouth on Tuesday, Thursday, Saturday at 7pm What changed:  how much to take  how to take this  when to take this  additional instructions   levETIRAcetam 500 MG tablet Commonly known as:  KEPPRA Take 1 tablet (500 mg total) by mouth daily. What changed:  how much to take  how to take this  when to take this  additional instructions   losartan 100 MG tablet Commonly known as:  COZAAR Take 100 mg by mouth at bedtime.   ondansetron 8 MG disintegrating tablet Commonly known as:  ZOFRAN ODT Take 1 tablet (8 mg total) by mouth every 8 (eight) hours as needed for nausea or vomiting.   VELPHORO 500 MG chewable tablet Generic drug:  sucroferric oxyhydroxide Chew 500 mg by mouth See admin instructions. Chew 1 tablet (500 mg) by mouth  after every meal (2-3 times daily)   venlafaxine XR 150 MG 24 hr capsule Commonly known as:  EFFEXOR-XR Take 1 capsule (150 mg total) by mouth daily. Start taking on:  03/22/2016   Vitamin C 250 MG Chew Chew 250 mg by mouth daily.       Vitals:   03/21/16 1630 03/21/16 1637  BP: (!) 144/78 (!) 145/62  Pulse: 83 83  Resp: 16 16  Temp:  98.9 F (37.2 C)    Skin clean, dry and intact without evidence of skin break down, no evidence of skin tears noted. IV catheter discontinued intact. Site without signs and symptoms of complications. Dressing and pressure applied.  Pt denies pain at this time. No complaints noted.  An After Visit Summary was printed and given to the patient. Patient escorted via [place], and D/C home via private auto.  Haywood Lasso BSN, RN [facility] Phone [phone]

## 2016-03-21 NOTE — Evaluation (Signed)
Occupational Therapy Evaluation Patient Details Name: Jim Wyatt MRN: [id number] DOB: Jul 11, 1980 Today's Date: 03/21/2016    History of Present Illness 36yo male admitted with GIB   Past Medical History:  Diagnosis Date  . Asthma   . CAP (community acquired pneumonia) 10/2013  . Cellulitis 11/2013   LLE  . DVT (deep venous thrombosis) ([place]) 10/2013   BLE  . 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 Went on HD Dec 2016.   [name] Fever   . Headache   . HIV infection ([place]) dx'd 10/2013  . Hypertension   . Leg pain   . Multiple environmental allergies    "trees, dogs, cats"  . Multiple food allergies   . Shingles < 2010  . 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 All education has been completed and the patient has no further questions. See below for any follow-up Occupational Therapy or equipment needs. OT to sign off. Thank you for referral.      Follow Up Recommendations  No OT follow up    Equipment Recommendations  None recommended by OT    Recommendations for Other Services       Precautions / Restrictions Precautions Precautions: None      Mobility Bed Mobility Overal bed mobility: Independent                Transfers Overall transfer level: Independent                    Balance                                            ADL Overall ADL's : Independent                                       General ADL Comments: completed bed level transfer, sink level grooming, don shoes, able to follow 3 step command. Pt upright in chair. Question need for psych follow up due to flat affect and limited engagement in coversation with therapist     Vision Additional Comments: pt does report he likes sitting in the dark. pt with lights off and dim  light provided for safety with movement   Perception     Praxis      Pertinent Vitals/Pain Pain Assessment: Faces Faces Pain Scale: Hurts little more Pain Location: stomach Pain Descriptors / Indicators: Discomfort Pain Intervention(s): Monitored during session;Repositioned;Patient requesting pain meds-RN notified     Hand Dominance Right   Extremity/Trunk Assessment Upper Extremity Assessment Upper Extremity Assessment: Overall WFL for tasks assessed   Lower Extremity Assessment Lower Extremity Assessment: Overall WFL for tasks assessed   Cervical / Trunk Assessment Cervical / Trunk Assessment: Normal   Communication Communication Communication: No difficulties   Cognition Arousal/Alertness: Awake/alert Behavior During Therapy: Flat affect Overall Cognitive Status: Difficult to assess                 General Comments: pt flat affect and minimal responses to open ended questions . pt will give yes or no responses mainly   General Comments       Exercises  Shoulder Instructions      Home Living Family/patient expects to be discharged to:: Private residence Living Arrangements: Non-relatives/Friends Available Help at Discharge: Friend(s);Available PRN/intermittently Type of Home: House Home Access: Level entry     Home Layout: One level     Bathroom Shower/Tub: Tub/shower unit Shower/tub characteristics: Architectural technologist: Standard Bathroom Accessibility: Yes How Accessible: Accessible via walker Home Equipment: Gilford Rile - 2 wheels;Crutches   Additional Comments: big german sheppard at home      Prior Functioning/Environment Level of Independence: Independent        Comments: driving, 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,         OT Problem List:     OT Treatment/Interventions:      OT Goals(Current goals can be found in the care plan section)    OT Frequency:     Barriers to D/C:            Co-evaluation               End of Session Nurse Communication: Mobility status;Precautions  Activity Tolerance: Patient tolerated treatment well Patient left: in chair;with call bell/phone within reach   Time: 0747-0800 OT Time Calculation (min): 13 min Charges:  OT General Charges $OT Visit: 1 Procedure OT Evaluation $OT Eval Low Complexity: 1 Procedure G-Codes:    Peri Maris 2016-04-06, 8:28 AM   Jeri Modena   OTR/L Pager: [phone] Office: [phone] .

## 2016-03-21 NOTE — Progress Notes (Signed)
PT Cancellation Note  Patient Details Name: Jim Wyatt MRN: [id number] DOB: 09-09-1980   Cancelled Treatment:    Reason Eval/Treat Not Completed: Patient at procedure or test/unavailable.  Pt in HD. 03/21/2016  Donnella Sham, PT [phone]  (pager)   Jim Wyatt 03/21/2016, 4:59 PM

## 2016-03-21 NOTE — Discharge Instructions (Signed)
Please start taking high fiber diet- there are things like metamucil which can be mixed in the water to help increase fiber intake-  Please use the anusol suppository for no more than 7 days as prolonged use can cause thinning of the skin Please follow up in our clinic on Friday for hospital follow up appointment Please see your PCP on February 7th- please call 1 week before per their request to confirm the time  High-Fiber Diet Fiber, also called dietary fiber, is a type of carbohydrate found in fruits, vegetables, whole grains, and beans. A high-fiber diet can have many health benefits. Your health care provider may recommend a high-fiber diet to help:  Prevent constipation. Fiber can make your bowel movements more regular.  Lower your cholesterol.  Relieve hemorrhoids, uncomplicated diverticulosis, or irritable bowel syndrome.  Prevent overeating as part of a weight-loss plan.  Prevent heart disease, type 2 diabetes, and certain cancers. What is my plan? The recommended daily intake of fiber includes:  38 grams for men under age 70.  30 grams for men over age 53.  37 grams for women under age 36.  27 grams for women over age 23. You can get the recommended daily intake of dietary fiber by eating a variety of fruits, vegetables, grains, and beans. Your health care provider may also recommend a fiber supplement if it is not possible to get enough fiber through your diet. What do I need to know about a high-fiber diet?  Fiber supplements have not been widely studied for their effectiveness, so it is better to get fiber through food sources.  Always check the fiber content on thenutrition facts label of any prepackaged food. Look for foods that contain at least 5 grams of fiber per serving.  Ask your dietitian if you have questions about specific foods that are related to your condition, especially if those foods are not listed in the following section.  Increase your daily  fiber consumption gradually. Increasing your intake of dietary fiber too quickly may cause bloating, cramping, or gas.  Drink plenty of water. Water helps you to digest fiber. What foods can I eat? Grains  Whole-grain breads. Multigrain cereal. Oats and oatmeal. Brown rice. Barley. Bulgur wheat. [place]. Bran muffins. Popcorn. Rye wafer crackers. Vegetables  Sweet potatoes. Spinach. Kale. Artichokes. Cabbage. Broccoli. Green peas. Carrots. Squash. Fruits  Berries. Pears. Apples. Oranges. Avocados. Prunes and raisins. Dried figs. Meats and Other Protein Sources  [place], kidney, pinto, and soy beans. Split peas. Lentils. Nuts and seeds. Dairy  Fiber-fortified yogurt. Beverages  Fiber-fortified soy milk. Fiber-fortified orange juice. Other  Fiber bars. The items listed above may not be a complete list of recommended foods or beverages. Contact your dietitian for more options.  What foods are not recommended? Grains  White bread. Pasta made with refined flour. White rice. Vegetables  Fried potatoes. Canned vegetables. Well-cooked vegetables. Fruits  Fruit juice. Cooked, strained fruit. Meats and Other Protein Sources  Fatty cuts of meat. Fried Sales executive or fried fish. Dairy  Milk. Yogurt. Cream cheese. Sour cream. Beverages  Soft drinks. Other  Cakes and pastries. Butter and oils. The items listed above may not be a complete list of foods and beverages to avoid. Contact your dietitian for more information.  What are some tips for including high-fiber foods in my diet?  Eat a wide variety of high-fiber foods.  Make sure that half of all grains consumed each day are whole grains.  Replace breads and cereals made from refined  flour or white flour with whole-grain breads and cereals.  Replace white rice with brown rice, bulgur wheat, or millet.  Start the day with a breakfast that is high in fiber, such as a cereal that contains at least 5 grams of fiber per serving.  Use beans in  place of meat in soups, salads, or pasta.  Eat high-fiber snacks, such as berries, raw vegetables, nuts, or popcorn. This information is not intended to replace advice given to you by your health care provider. Make sure you discuss any questions you have with your health care provider. Document Released: 02/13/2005 Document Revised: 07/22/2015 Document Reviewed: 07/29/2013 Elsevier Interactive Patient Education  2017 Else[employer] Inc.   Hemorrhoids Hemorrhoids are swollen veins in and around the rectum or anus. There are two types of hemorrhoids:  Internal hemorrhoids. These occur in the veins that are just inside the rectum. They may poke through to the outside and become irritated and painful.  External hemorrhoids. These occur in the veins that are outside of the anus and can be felt as a painful swelling or hard lump near the anus. Most hemorrhoids do not cause serious problems, and they can be managed with home treatments such as diet and lifestyle changes. If home treatments do not help your symptoms, procedures can be done to shrink or remove the hemorrhoids. What are the causes? This condition is caused by increased pressure in the anal area. This pressure may result from various things, including:  Constipation.  Straining to have a bowel movement.  Diarrhea.  Pregnancy.  Obesity.  Sitting for long periods of time.  Heavy lifting or other activity that causes you to strain.  Anal sex. What are the signs or symptoms? Symptoms of this condition include:  Pain.  Anal itching or irritation.  Rectal bleeding.  Leakage of stool (feces).  Anal swelling.  One or more lumps around the anus. How is this diagnosed? This condition can often be diagnosed through a visual exam. Other exams or tests may also be done, such as:  Examination of the rectal area with a gloved hand (digital rectal exam).  Examination of the anal canal using a small tube (anoscope).  A blood  test, if you have lost a significant amount of blood.  A test to look inside the colon (sigmoidoscopy or colonoscopy). How is this treated? This condition can usually be treated at home. However, various procedures may be done if dietary changes, lifestyle changes, and other home treatments do not help your symptoms. These procedures can help make the hemorrhoids smaller or remove them completely. Some of these procedures involve surgery, and others do not. Common procedures include:  Rubber band ligation. Rubber bands are placed at the base of the hemorrhoids to cut off the blood supply to them.  Sclerotherapy. Medicine is injected into the hemorrhoids to shrink them.  Infrared coagulation. A type of light energy is used to get rid of the hemorrhoids.  Hemorrhoidectomy surgery. The hemorrhoids are surgically removed, and the veins that supply them are tied off.  Stapled hemorrhoidopexy surgery. A circular stapling device is used to remove the hemorrhoids and use staples to cut off the blood supply to them. Follow these instructions at home: Eating and drinking  Eat foods that have a lot of fiber in them, such as whole grains, beans, nuts, fruits, and vegetables. Ask your health care provider about taking products that have added fiber (fiber supplements).  Drink enough fluid to keep your urine clear or pale  yellow. Managing pain and swelling  Take warm sitz baths for 20 minutes, 3-4 times a day to ease pain and discomfort.  If directed, apply ice to the affected area. Using ice packs between sitz baths may be helpful.  Put ice in a plastic bag.  Place a towel between your skin and the bag.  Leave the ice on for 20 minutes, 2-3 times a day. General instructions  Take over-the-counter and prescription medicines only as told by your health care provider.  Use medicated creams or suppositories as told.  Exercise regularly.  Go to the bathroom when you have the urge to have a  bowel movement. Do not wait.  Avoid straining to have bowel movements.  Keep the anal area dry and clean. Use wet toilet paper or moist towelettes after a bowel movement.  Do not sit on the toilet for long periods of time. This increases blood pooling and pain. Contact a health care provider if:  You have increasing pain and swelling that are not controlled by treatment or medicine.  You have uncontrolled bleeding.  You have difficulty having a bowel movement, or you are unable to have a bowel movement.  You have pain or inflammation outside the area of the hemorrhoids. This information is not intended to replace advice given to you by your health care provider. Make sure you discuss any questions you have with your health care provider. Document Released: 02/11/2000 Document Revised: 07/14/2015 Document Reviewed: 10/28/2014 Elsevier Interactive Patient Education  2017 [employer].

## 2016-03-21 NOTE — Telephone Encounter (Signed)
TOC-APPT with the [facility] per Dr. Tiburcio Pea on 03/24/2016 @ 2:15p.m.

## 2016-03-21 NOTE — Progress Notes (Signed)
Subjective:  No cos/ for hd today on schedule  /possible dc after  HD ??  Objective Vital signs in last 24 hours: Vitals:   03/20/16 1700 03/20/16 2100 03/20/16 2232 03/21/16 0631  BP: 119/62 (!) 135/55 135/80 (!) 145/83  Pulse: 78 80  76  Resp: 16 16    Temp: 99 F (37.2 C) 98 F (36.7 C)  98.3 F (36.8 C)  TempSrc: Oral Axillary  Oral  SpO2: 92% 92%  96%  Weight:      Height:       Weight change:   Physical Exam: General: alert NAD OX4 Heart: RRR no mur, rub or gal  Lungs: CTA  bilat Abdomen: BS pos  Soft , NT/ ND Extremities: no pedal edema Dialysis Access: pos bruit LUA  AVF + bruit    Dialysis Orders: [facility]: [facility] 66 kgHD Bath 2k, 2caTime 3hrs 56minHeparin 2,000. Access LUA AVF  Hec74mcg IV/HD mircera 225mcg q 2wks Hd Last on Units IV/HD  Other op labs hgb 8.3 03/09/16 ca 9 phos 7.4 pth 942 TFS29%  Problem/Plan: 1. Hyperkalemia: Resolved with HD  2. ESRD - TTS [facility] . 2.0 K bath. No heparin.  ok fro dc per renal view 3. Lower GI Bleed:w/u per admit team/ s/p colonoscopy 03/18/16 -no active bleeding, external hemorrhoids, patchy mucosa ?fistulous tract biopsied, few diverticula   4. Hypertension/volume - BP up on admit , now controlled on meds (in past ??compliance Noted bp down with admits and up at op kid center). Now at EDW Next HD today -  5. Anemia -HGB 8.6 trending up>9.5    Max ESA at OP unit last dose 03/09/16. Rec'd 2 unit of PRBCs since adm. Tranfuse on HD if HGB < 7.0 6. Metabolic bone disease - Hec q hd binder =velphoro 7. HIV -meds per admit 8. Nutrition: Clear liq diet.  9. Seizures: H/O seizure disorder. Had seizure yesterday. Has been restarted on Keppra. No seizure activity today.    Ernest Haber, PA-C [place] [phone] 03/21/2016,9:48 AM  LOS: 6 days   Pt seen, examined and agree w A/P as above.  Kelly Splinter MD [facility] pager [phone]   03/21/2016, 3:54  PM    Labs: Basic Metabolic Panel:  Recent Labs Lab 03/17/16 0816 03/18/16 0433 03/19/16 0447  NA 136 134* 135  K 4.1 4.2 4.3  CL 96* 96* 98*  CO2 27 25 27   GLUCOSE 75 82 99  BUN 39* 48* 52*  CREATININE 10.05* 12.31* 14.53*  CALCIUM 8.3* 8.4* 8.1*   Liver Function Tests:  Recent Labs Lab 03/16/16 0637 03/17/16 0816 03/18/16 0433  AST 30 28 23   ALT 11* 11* 12*  ALKPHOS 48 40 34*  BILITOT 0.8 1.0 0.8  PROT 7.9 6.8 6.0*  ALBUMIN 3.1* 2.5* 2.3*   CBC:  Recent Labs Lab 03/15/16 1052  03/17/16 0816 03/18/16 0433 03/19/16 0447 03/20/16 0715 03/21/16 0815  WBC 3.9*  < > 3.7* 2.5* 4.0 3.5* 4.1  NEUTROABS 3.0  --   --   --   --   --   --   HGB 6.4*  < > 7.3* 6.7* 7.5* 8.6* 9.5*  HCT 19.8*  < > 22.6* 21.0* 23.5* 26.9* 29.9*  MCV 95.2  < > 94.2 95.5 96.3 96.1 96.5  PLT 102*  < > 99* 117* 158 164 198  < > = values in this interval not displayed. Cardiac Enzymes: No results for input(s): CKTOTAL, CKMB, CKMBINDEX, TROPONINI in  the last 168 hours. CBG:  Recent Labs Lab 03/17/16 0805 03/18/16 0831 03/19/16 0751 03/20/16 0743 03/21/16 0759  GLUCAP 70 77 75 109* 83    Studies/Results: No results found. Medications:  . sodium chloride   Intravenous Once  . sodium chloride   Intravenous Once  . abacavir  600 mg Oral Q supper  . azithromycin  1,200 mg Oral Weekly  . carvedilol  25 mg Oral BID  . dapsone  100 mg Oral Daily  . [START ON 03/23/2016] darbepoetin (ARANESP) injection - DIALYSIS  200 mcg Intravenous Q Thu-HD  . darunavir-cobicistat  1 tablet Oral QPM  . diltiazem  180 mg Oral Daily  . dolutegravir  50 mg Oral QPM  . doxercalciferol  4 mcg Intravenous Q T,Th,Sa-HD  . ferric gluconate (FERRLECIT/NULECIT) IV  125 mg Intravenous Q Thu-HD  . hydrALAZINE  50 mg Oral TID  . hydrocortisone  25 mg Rectal BID  . levETIRAcetam  250 mg Oral Once per day on Tue Thu Sat  . levETIRAcetam  500 mg Oral Daily  . losartan  100 mg Oral QHS  . sodium chloride  flush  3 mL Intravenous Q12H  . venlafaxine XR  75 mg Oral Daily

## 2016-03-21 NOTE — Discharge Summary (Signed)
Name: Jim Wyatt MRN: [id number] DOB: 03-05-1980 36 y.o. PCP: [name], MD  Date of Admission: 03/15/2016 10:00 AM Date of Discharge: 03/21/2016 Attending Physician: Sid Falcon, MD  Discharge Diagnosis: 1. GI Bleed 2. ESRD on HD 3. Depression 4. SEizure Disorder   Principal Problem:   GI bleed Active Problems:   Human immunodeficiency virus (HIV) disease ([place])   Anemia due to end stage renal disease (HCC)   ESRD (end stage renal disease) (HCC)   Seizure disorder (HCC)   Hyperkalemia   Rectal bleeding   Breast lump or mass   Discharge Medications: Allergies as of 03/21/2016      Reactions   Bactrim [sulfamethoxazole-trimethoprim] Other (See Comments)   Renal failure   Vancomycin Other (See Comments)   Patient states vancomycin caused kidney injury   Reglan [metoclopramide] Other (See Comments)   Causes ticks/ jerks   Amlodipine Palpitations, Other (See Comments)   fatigue   Augmentin [amoxicillin-pot Clavulanate] Hives      Medication List    STOP taking these medications   HYDROcodone-acetaminophen 5-325 MG tablet Commonly known as:  NORCO/VICODIN     TAKE these medications   abacavir 300 MG tablet Commonly known as:  ZIAGEN Take 2 tablets (600 mg total) by mouth daily with supper.   acetaminophen 500 MG tablet Commonly known as:  TYLENOL Take 1,000 mg by mouth every 6 (six) hours as needed (pain).   albuterol 108 (90 Base) MCG/ACT inhaler Commonly known as:  PROVENTIL HFA;VENTOLIN HFA Inhale 2 puffs into the lungs every 6 (six) hours as needed for wheezing or shortness of breath.   azithromycin 600 MG tablet Commonly known as:  ZITHROMAX Take 2 tablets (1,200 mg total) by mouth once a week. On Mondays   CARTIA XT 180 MG 24 hr capsule Generic drug:  diltiazem Take 180 mg by mouth daily.   carvedilol 25 MG tablet Commonly known as:  COREG Take 25 mg by mouth 2 (two) times daily.   Cyanocobalamin 2500 MCG Chew Chew 5,000 mcg  by mouth daily.   dapsone 100 MG tablet Take 1 tablet (100 mg total) by mouth daily.   darunavir-cobicistat 800-150 MG tablet Commonly known as:  PREZCOBIX Take 1 tablet by mouth every evening. Swallow whole. Do NOT crush, break or chew tablets. Take with food.   docusate sodium 100 MG capsule Commonly known as:  COLACE Take 1 capsule (100 mg total) by mouth daily as needed for mild constipation.   dolutegravir 50 MG tablet Commonly known as:  TIVICAY Take 1 tablet (50 mg total) by mouth every evening.   hydrALAZINE 100 MG tablet Commonly known as:  APRESOLINE Take 0.5 tablets (50 mg total) by mouth 3 (three) times daily. 6:30am, 2:30pm, 11pm - for high blood pressure What changed:  when to take this   hydrocortisone 25 MG suppository Commonly known as:  ANUSOL-HC Place 1 suppository (25 mg total) rectally 2 (two) times daily. For no more than 7 days   levETIRAcetam 250 MG tablet Commonly known as:  KEPPRA Take 1 tablet (250 mg) by mouth on Tuesday, Thursday, Saturday at 7pm What changed:  how much to take  how to take this  when to take this  additional instructions   levETIRAcetam 500 MG tablet Commonly known as:  KEPPRA Take 1 tablet (500 mg total) by mouth daily. What changed:  how much to take  how to take this  when to take this  additional instructions   losartan  100 MG tablet Commonly known as:  COZAAR Take 100 mg by mouth at bedtime.   ondansetron 8 MG disintegrating tablet Commonly known as:  ZOFRAN ODT Take 1 tablet (8 mg total) by mouth every 8 (eight) hours as needed for nausea or vomiting.   VELPHORO 500 MG chewable tablet Generic drug:  sucroferric oxyhydroxide Chew 500 mg by mouth See admin instructions. Chew 1 tablet (500 mg) by mouth after every meal (2-3 times daily)   venlafaxine XR 150 MG 24 hr capsule Commonly known as:  EFFEXOR-XR Take 1 capsule (150 mg total) by mouth daily. Start taking on:  03/22/2016   Vitamin C 250 MG  Chew Chew 250 mg by mouth daily.       Disposition and follow-up:   Jim Wyatt was discharged from [facility] in stable condition.  At the hospital follow up visit please address:  1.  GI Bleed: Rectal bleeding? Pain? Has he been taking stool softeners and using Anusol for hemorrhoid relief?   ESRD: Compliant with HD?  Depression: Compliant with Effexor?  Seizure Disorder: Compliance with DAILY 500mg  keppra + post-dialysis 250 mg?  2.  Labs / imaging needed at time of follow-up: None.  3.  Pending labs/ test needing follow-up: None.  Follow-up Appointments: Follow-up Information    [name], MD. Schedule an appointment as soon as possible for a visit on 04/05/2016.   Specialty:  Internal Medicine Why:  Per their instruction, please call ahead to confirm the time on this date  Contact information: [place] [address] [phone]        [facility]. Go on 03/24/2016.   Why:  2:15 PM for hospital follow up appointment  Contact information: [address] [place] Hospital Course by problem list: Principal Problem:   GI bleed Active Problems:   Human immunodeficiency virus (HIV) disease ([place])   Anemia due to end stage renal disease (HCC)   ESRD (end stage renal disease) (HCC)   Seizure disorder (HCC)   Hyperkalemia   Rectal bleeding   Breast lump or mass   1. GI Bleed Jim Wyatt is a 36 y/o M with Mhx significant for HIV, ESRD on HD and intussusception s/p correction 10/17 who presented for evaluation of BRBPR. Patient had an episode of GIB about 3 weeks after surgery and presents again with the same complaint. He endorsed several large volume bright red bowel movements and sharp intermittent abdominal pain. Also endorsed nausea and vomiting.  CT abdomen showed probable colonic wall thickening suggesting colitis and questionable  gallbladder wall thickening. On admission he was acutely anemic with Hb at 6.4 and required several transfusions secondary to recurrent GIB. He had a colonoscopy with biopsy during admission which revealed large non-thrombosed external and internal hemorrhoids, benign post-surgical changes at anastomosis site and diverticulosis. No blood was observed during procedure. Following the colonoscopy he had resolution of his rectal bleeding with stabilization of his hemoglobin. Biopsies taken during colonoscopy returned with benign nonspecific inflammation at anastomosis site.   2. ESRD on HD Received HD without complication with assistance of nephrology.   3. Depression Patient was anhedonic and minimally interactive during most of the admission prompting psychiatry consult and evaluation. Psychiatry suggested Effexor 150 mg daily. Patient started to have some improved moods before discharge.   4. Seizure Disorder Patient had witnessed seizure on HD2. Patient had not been  taking keppra as prescribed. Was loaded with IV Keppra and started back on proper regimen of keppra.   Discharge Vitals:   BP 136/76 (BP Location: Right Arm)   Pulse 81   Temp 98.2 F (36.8 C) (Oral)   Resp 15   Ht 5\' 7"  (1.702 m)   Wt 149 lb 7.6 oz (67.8 kg)   SpO2 96%   BMI 23.41 kg/m   Pertinent Labs, Studies, and Procedures:  CT Abdomen/Pelvis: Probable colonic wall thickening consistent with colitis.  Colonoscopy: Large non-thrombosed external and internal hemorrhoids. Non-specific post-surgical inflammation at anast. Site. Diverticuli Hb on admission 6.4 --> 9.5 on discharge LDH ^ at 379 Haptoglobin undetectable  Protein electrophoresis: Hypergammaglobulinemia Anti-GBM, c-ANCA, p-ANCA: Negative Hemodialysis: twice  Discharge Instructions: Discharge Instructions    Diet - low sodium heart healthy    Complete by:  As directed    Increase activity slowly    Complete by:  As directed     Please follow-up with the  internal medicine clinic.   Signed: Greycen Felter, DO 03/21/2016, 4:24 PM

## 2016-03-22 LAB — CULTURE, BLOOD (ROUTINE X 2)
Culture: NO GROWTH
Culture: NO GROWTH

## 2016-03-22 LAB — GLOMERULAR BASEMENT MEMBRANE ANTIBODIES: GBM Ab: 7 units (ref 0–20)

## 2016-03-22 NOTE — Telephone Encounter (Signed)
Called for TOC, no answer, lm for rtc

## 2016-03-23 ENCOUNTER — Telehealth: Payer: Self-pay | Admitting: Internal Medicine

## 2016-03-23 DIAGNOSIS — D631 Anemia in chronic kidney disease: Secondary | ICD-10-CM | POA: Diagnosis not present

## 2016-03-23 DIAGNOSIS — N186 End stage renal disease: Secondary | ICD-10-CM | POA: Diagnosis not present

## 2016-03-23 DIAGNOSIS — D689 Coagulation defect, unspecified: Secondary | ICD-10-CM | POA: Diagnosis not present

## 2016-03-23 DIAGNOSIS — N2581 Secondary hyperparathyroidism of renal origin: Secondary | ICD-10-CM | POA: Diagnosis not present

## 2016-03-23 NOTE — Telephone Encounter (Signed)
APT. REMINDER CALL, LMTCB °

## 2016-03-24 ENCOUNTER — Ambulatory Visit (INDEPENDENT_AMBULATORY_CARE_PROVIDER_SITE_OTHER): Payer: Medicare Other | Admitting: Internal Medicine

## 2016-03-24 ENCOUNTER — Encounter: Payer: Self-pay | Admitting: Internal Medicine

## 2016-03-24 VITALS — BP 157/86 | HR 95 | Temp 98.1°F | Ht 67.0 in | Wt 142.7 lb

## 2016-03-24 DIAGNOSIS — F32A Depression, unspecified: Secondary | ICD-10-CM

## 2016-03-24 DIAGNOSIS — Z79899 Other long term (current) drug therapy: Secondary | ICD-10-CM

## 2016-03-24 DIAGNOSIS — N186 End stage renal disease: Secondary | ICD-10-CM

## 2016-03-24 DIAGNOSIS — K625 Hemorrhage of anus and rectum: Secondary | ICD-10-CM

## 2016-03-24 DIAGNOSIS — Z992 Dependence on renal dialysis: Secondary | ICD-10-CM

## 2016-03-24 DIAGNOSIS — F329 Major depressive disorder, single episode, unspecified: Secondary | ICD-10-CM | POA: Diagnosis not present

## 2016-03-24 DIAGNOSIS — K648 Other hemorrhoids: Secondary | ICD-10-CM

## 2016-03-24 DIAGNOSIS — Z09 Encounter for follow-up examination after completed treatment for conditions other than malignant neoplasm: Secondary | ICD-10-CM

## 2016-03-24 DIAGNOSIS — K922 Gastrointestinal hemorrhage, unspecified: Secondary | ICD-10-CM | POA: Diagnosis present

## 2016-03-24 DIAGNOSIS — Z8719 Personal history of other diseases of the digestive system: Secondary | ICD-10-CM | POA: Diagnosis not present

## 2016-03-24 DIAGNOSIS — G40909 Epilepsy, unspecified, not intractable, without status epilepticus: Secondary | ICD-10-CM | POA: Diagnosis not present

## 2016-03-24 NOTE — Assessment & Plan Note (Addendum)
Patient was hospitalized for GI bleeding. He had a colonoscopy which showed anorectal hemorrhoids. No bleeding was observed on colonoscopy. His hemoglobin was stable at time of discharge. Since discharge she has had no further episodes of GI bleeding.  Patient states continued abdominal pain around the site of his incision from an abdominal resection of his intussuscepted bowel which took place several months ago.  Patient has had 2 small episodes of diarrhea yesterday which were not associated with significant abdominal pain. However this morning the patient does note some symptoms of racing heart and shortness of breath. He does not have significant pallor on exam but does have bounding pulses. I would like to check a CBC today just to make sure his blood counts have not dropped.  Patient is otherwise HDS stable without signs of bleeding.  Plan: CBC today. Continue to observe for signs and symptoms of GI bleeding. Follow-up with his PCP.

## 2016-03-24 NOTE — Assessment & Plan Note (Signed)
Patient reported during his hospitalization to be noncompliant with Keppra therapy. He was noted to have a witnessed seizure on hospital day 2. He was loaded with IV Keppra and restarted on his previous home medication regimen. Since discharged patient has not had any further seizures. He reports that he has been compliant with his Keppra 250 mg Tuesday, Thursday, Saturday dosed after dialysis.

## 2016-03-24 NOTE — Progress Notes (Signed)
Internal Medicine Clinic Attending  Case discussed with Dr. Strelow at the time of the visit.  We reviewed the resident's history and exam and pertinent patient test results.  I agree with the assessment, diagnosis, and plan of care documented in the resident's note.  

## 2016-03-24 NOTE — Patient Instructions (Signed)
You seem to be doing well since her discharge from the hospital. Please follow-up with your primary doctor.

## 2016-03-24 NOTE — Progress Notes (Addendum)
CC: HFU HPI: [name] is a 36 y.o. male with a h/o of ESRD on HD, HIV, s/p bowel resection, blood loss anemia, seizure d/o and depression who presents for hospital follow up.  Please see Problem-based charting for HPI and the status of patient's chronic medical conditions.  Past Medical History:  Diagnosis Date  . Asthma   . CAP (community acquired pneumonia) 10/2013  . Cellulitis 11/2013   LLE  . DVT (deep venous thrombosis) ([place]) 10/2013   BLE  . 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 Went on HD Dec 2016.   [name] Fever   . Headache   . HIV infection ([place]) dx'd 10/2013  . Hypertension   . Leg pain   . Multiple environmental allergies    "trees, dogs, cats"  . Multiple food allergies   . Shingles < 2010  . 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 Smoking status: Never Smoker  . Smokeless tobacco: Never Used  . Alcohol use No     Comment: I stopped drinking along time ago "   Family History  Problem Relation Age of Onset  . Hypertension Mother   . Diabetes Maternal Grandmother   . Kidney disease Maternal Grandmother    Review of Systems: ROS in HPI. Otherwise: Review of Systems  Constitutional: Negative for chills, fever and weight loss.  Respiratory: Positive for shortness of breath. Negative for cough.   Cardiovascular: Negative for chest pain and leg swelling.  Gastrointestinal: Positive for abdominal pain and diarrhea. Negative for constipation, nausea and vomiting.  Genitourinary: Negative for dysuria, frequency and urgency.  Neurological: Positive for dizziness.   Physical Exam: Vitals:   03/24/16 1433  BP: (!) 157/86  Pulse: 95  Temp: 98.1 F (36.7 C)  TempSrc: Oral  SpO2: 99%  Weight: 142 lb 11.2 oz (64.7 kg)  Height: 5\' 7"  (1.702 m)   Physical Exam  Constitutional: He is cooperative. No distress.  Cardiovascular: Normal rate, regular rhythm,  normal heart sounds and normal pulses.  Exam reveals no gallop.   No murmur heard. Bounding pulses  Pulmonary/Chest: Effort normal and breath sounds normal. No respiratory distress. He has no wheezes. He has no rhonchi. He has no rales.  Abdominal: Soft. Bowel sounds are normal. There is generalized tenderness.  Well healing midline incision  Musculoskeletal: He exhibits no edema.  Skin: Skin is warm, dry and intact. He is not diaphoretic. No pallor.    Assessment & Plan:  See encounters tab for problem based medical decision making. Patient discussed with Dr. Dareen Piano  Seizure disorder [facility]) Patient reported during his hospitalization to be noncompliant with Keppra therapy. He was noted to have a witnessed seizure on hospital day 2. He was loaded with IV Keppra and restarted on his previous home medication regimen. Since discharged patient has not had any further seizures. He reports that he has been compliant with his Keppra 250 mg Tuesday, Thursday, Saturday dosed after dialysis.  GI bleed Patient was hospitalized for GI bleeding. He had a colonoscopy which showed anorectal hemorrhoids. No bleeding was observed on colonoscopy. His hemoglobin was stable at time of discharge. Since discharge she has had no further episodes of GI bleeding.  Patient states continued abdominal pain around the site of his incision from an abdominal resection of his intussuscepted bowel which took place several months ago.  Patient has had 2 small episodes of  diarrhea yesterday which were not associated with significant abdominal pain. However this morning the patient does note some symptoms of racing heart and shortness of breath. He does not have significant pallor on exam but does have bounding pulses. I would like to check a CBC today just to make sure his blood counts have not dropped.  Patient is otherwise HDS stable without signs of bleeding.  Plan: CBC today. Continue to observe for signs and symptoms  of GI bleeding. Follow-up with his PCP.  ESRD on dialysis [facility]) Patient has a history of intermittent noncompliance with dialysis. He reports he has been compliant with dialysis since leaving the hospital.  Plan: Continue hemodialysis for ESRD.  Depression On hospitalization patient was noted to be anhedonic with depressed mood. He was evaluated by psychiatry. He was started on Effexor extended release 150 mg daily. He was reported to have good effect of this during his hospitalization.   Patient reports that he has not yet picked up his prescription after leaving the hospital. I recommended that he do this since he was having good effect previously while hospitalized.  Assessment: Unspecified depression, well controlled.  Plan: Continue Effexor extended release 150 mg.   Signed: Holley Raring, MD 03/24/2016, 4:47 PM  Pager: [phone]

## 2016-03-24 NOTE — Assessment & Plan Note (Signed)
Patient has a history of intermittent noncompliance with dialysis. He reports he has been compliant with dialysis since leaving the hospital.  Plan: Continue hemodialysis for ESRD.

## 2016-03-24 NOTE — Assessment & Plan Note (Addendum)
On hospitalization patient was noted to be anhedonic with depressed mood. He was evaluated by psychiatry. He was started on Effexor extended release 150 mg daily. He was reported to have good effect of this during his hospitalization.   Patient reports that he has not yet picked up his prescription after leaving the hospital. I recommended that he do this since he was having good effect previously while hospitalized.  Assessment: Unspecified depression, well controlled.  Plan: Continue Effexor extended release 150 mg.

## 2016-03-24 NOTE — Telephone Encounter (Signed)
Came to HFU appt today

## 2016-03-25 ENCOUNTER — Other Ambulatory Visit: Payer: Self-pay | Admitting: Internal Medicine

## 2016-03-25 DIAGNOSIS — N186 End stage renal disease: Secondary | ICD-10-CM | POA: Diagnosis not present

## 2016-03-25 DIAGNOSIS — D631 Anemia in chronic kidney disease: Secondary | ICD-10-CM | POA: Diagnosis not present

## 2016-03-25 DIAGNOSIS — N2581 Secondary hyperparathyroidism of renal origin: Secondary | ICD-10-CM | POA: Diagnosis not present

## 2016-03-25 DIAGNOSIS — D689 Coagulation defect, unspecified: Secondary | ICD-10-CM | POA: Diagnosis not present

## 2016-03-25 LAB — CBC
Hematocrit: 30 % — ABNORMAL LOW (ref 37.5–51.0)
Hemoglobin: 9.5 g/dL — ABNORMAL LOW (ref 13.0–17.7)
MCH: 30.3 pg (ref 26.6–33.0)
MCHC: 31.7 g/dL (ref 31.5–35.7)
MCV: 96 fL (ref 79–97)
Platelets: 201 10*3/uL (ref 150–379)
RBC: 3.14 x10E6/uL — ABNORMAL LOW (ref 4.14–5.80)
RDW: 16.2 % — ABNORMAL HIGH (ref 12.3–15.4)
WBC: 5.1 10*3/uL (ref 3.4–10.8)

## 2016-03-27 ENCOUNTER — Telehealth: Payer: Self-pay | Admitting: *Deleted

## 2016-03-27 NOTE — Telephone Encounter (Signed)
[facility] called stating they are unable to contact patient to arrange delivery of his medications. Called patient and left a voice mail to call the pharmacy and also to call the clinic for a follow up appointment. Myrtis Hopping

## 2016-03-28 DIAGNOSIS — D631 Anemia in chronic kidney disease: Secondary | ICD-10-CM | POA: Diagnosis not present

## 2016-03-28 DIAGNOSIS — D689 Coagulation defect, unspecified: Secondary | ICD-10-CM | POA: Diagnosis not present

## 2016-03-28 DIAGNOSIS — N2581 Secondary hyperparathyroidism of renal origin: Secondary | ICD-10-CM | POA: Diagnosis not present

## 2016-03-28 DIAGNOSIS — N186 End stage renal disease: Secondary | ICD-10-CM | POA: Diagnosis not present

## 2016-03-29 DIAGNOSIS — N049 Nephrotic syndrome with unspecified morphologic changes: Secondary | ICD-10-CM | POA: Diagnosis not present

## 2016-03-29 DIAGNOSIS — Z992 Dependence on renal dialysis: Secondary | ICD-10-CM | POA: Diagnosis not present

## 2016-03-29 DIAGNOSIS — N186 End stage renal disease: Secondary | ICD-10-CM | POA: Diagnosis not present

## 2016-03-30 DIAGNOSIS — N2581 Secondary hyperparathyroidism of renal origin: Secondary | ICD-10-CM | POA: Diagnosis not present

## 2016-03-30 DIAGNOSIS — R509 Fever, unspecified: Secondary | ICD-10-CM | POA: Diagnosis not present

## 2016-03-30 DIAGNOSIS — N186 End stage renal disease: Secondary | ICD-10-CM | POA: Diagnosis not present

## 2016-03-30 DIAGNOSIS — D631 Anemia in chronic kidney disease: Secondary | ICD-10-CM | POA: Diagnosis not present

## 2016-04-01 DIAGNOSIS — N2581 Secondary hyperparathyroidism of renal origin: Secondary | ICD-10-CM | POA: Diagnosis not present

## 2016-04-01 DIAGNOSIS — N186 End stage renal disease: Secondary | ICD-10-CM | POA: Diagnosis not present

## 2016-04-01 DIAGNOSIS — D631 Anemia in chronic kidney disease: Secondary | ICD-10-CM | POA: Diagnosis not present

## 2016-04-01 DIAGNOSIS — R509 Fever, unspecified: Secondary | ICD-10-CM | POA: Diagnosis not present

## 2016-04-03 ENCOUNTER — Ambulatory Visit (INDEPENDENT_AMBULATORY_CARE_PROVIDER_SITE_OTHER): Payer: Medicare Other | Admitting: Internal Medicine

## 2016-04-03 ENCOUNTER — Encounter: Payer: Self-pay | Admitting: Internal Medicine

## 2016-04-03 VITALS — BP 147/102 | HR 109 | Temp 98.0°F | Ht 67.0 in | Wt 158.0 lb

## 2016-04-03 DIAGNOSIS — Z9119 Patient's noncompliance with other medical treatment and regimen: Secondary | ICD-10-CM | POA: Diagnosis not present

## 2016-04-03 DIAGNOSIS — N186 End stage renal disease: Secondary | ICD-10-CM | POA: Diagnosis not present

## 2016-04-03 DIAGNOSIS — Z91199 Patient's noncompliance with other medical treatment and regimen due to unspecified reason: Secondary | ICD-10-CM

## 2016-04-03 DIAGNOSIS — Z992 Dependence on renal dialysis: Secondary | ICD-10-CM | POA: Diagnosis not present

## 2016-04-03 DIAGNOSIS — R110 Nausea: Secondary | ICD-10-CM | POA: Diagnosis not present

## 2016-04-03 DIAGNOSIS — B20 Human immunodeficiency virus [HIV] disease: Secondary | ICD-10-CM | POA: Diagnosis not present

## 2016-04-03 MED ORDER — ONDANSETRON 8 MG PO TBDP: 8.0000 mg | ORAL_TABLET | Freq: Three times a day (TID) | ORAL | 11 refills | Status: DC | PRN

## 2016-04-03 NOTE — Progress Notes (Signed)
HPI: Jim Wyatt is a 36 y.o. male who is here for his HIV f/u.   Allergies: Allergies  Allergen Reactions  . Bactrim [Sulfamethoxazole-Trimethoprim] Other (See Comments)    Renal failure  . Vancomycin Other (See Comments)    Patient states vancomycin caused kidney injury  . Reglan [Metoclopramide] Other (See Comments)    Causes ticks/ jerks  . Amlodipine Palpitations and Other (See Comments)    fatigue  . Augmentin [Amoxicillin-Pot Clavulanate] Hives    Vitals: Temp: 98 F (36.7 C) (02/05 1550) Temp Source: Oral (02/05 1550) BP: 147/102 (02/05 1550) Pulse Rate: 109 (02/05 1550)  Past Medical History: Past Medical History:  Diagnosis Date  . Asthma   . CAP (community acquired pneumonia) 10/2013  . Cellulitis 11/2013   LLE  . DVT (deep venous thrombosis) ([place]) 10/2013   BLE  . 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 Went on HD Dec 2016.   [name] Fever   . Headache   . HIV infection ([place]) dx'd 10/2013  . Hypertension   . Leg pain   . Multiple environmental allergies    "trees, dogs, cats"  . Multiple food allergies   . Shingles < 2010  . 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 Marital status: Single    Spouse name: N/A  . Number of children: N/A  . Years of education: N/A   Social History Main Topics  . Smoking status: Never Smoker  . Smokeless tobacco: Never Used  . Alcohol use No     Comment: I stopped drinking along time ago "  . Drug use: No  . Sexual activity: Not Asked   Other Topics Concern  . None   Social History Narrative  . None    Previous Regimen: RAL, RPV  Current Regimen: ABC, DTG, Prezcobix  Labs: HIV 1 RNA Quant (copies/mL)  Date Value  02/01/2016 455,000  11/05/2015 125,000  07/06/2015 86,000   CD4 T Cell Abs (/uL)  Date Value  03/15/2016 20 (L)  02/01/2016 20 (L)  01/03/2016 10 (L)   Hep B S Ab (no units)   Date Value  12/30/2015 Non Reactive   Hepatitis B Surface Ag (no units)  Date Value  03/16/2016 Negative   HCV Ab (no units)  Date Value  11/10/2013 NEGATIVE    CrCl: Estimated Creatinine Clearance: 8.4 mL/min (by C-G formula based on SCr of 11.76 mg/dL (H)).  Lipids:    Component Value Date/Time   CHOL 100 11/15/2013 0820   TRIG 327 (H) 11/15/2013 0820   HDL 8 (L) 11/15/2013 0820   CHOLHDL 12.5 11/15/2013 0820   VLDL 65 (H) 11/15/2013 0820   LDLCALC 27 11/15/2013 0820    Assessment: Jim Wyatt is here for his HIV visit. He looks terrible today. He is ESRD now and his HIV is very poorly controlled. He has a very poor history of adherence. He stated that he has been taking his meds but we know that he is not being honest with [place]. His VL has always been elevated. I called [facility] today to check on his fill history. He hasn't filled there since Oct. He kept stating that he has been taking his old supply. There is no way this is true at this point.   We fear that he has resistance mutations at this point. He doesn't want to do labs today. Therefore, we are  going to bring him back next week to get a genotype then to see me in March to adjust the regimen as needed.   Recommendations:  Genotype next week with integrase F/u with me in March for regimen adjustment if needed  Onnie Boer, PharmD, BCPS, AAHIVP, CPP Clinical [facility]ious [facility] 04/03/2016, 11:12 PM

## 2016-04-03 NOTE — Patient Instructions (Signed)
FEb 12th @ 8:30 lab  March 5th- @ 8:30 with [facility]

## 2016-04-03 NOTE — Progress Notes (Signed)
Patient ID: Jim Wyatt, male   DOB: [birth year], 36 y.o.   MRN: [id number]  HPI 36yo M with poorly controlled hiv disease, ESRD on HD, poorly controlled HTn, CD 4 count of 20/VL 455,000 on DTG/ABC/ DRVc. He was admitte. He wad from 1/17- 1/23 for gi bleed. He had hx of intussusception resection in oct 2017 but has had recent hematochezia with hgb down to 6.4. CT showed inflammation to right side of colon. He was seen by psychiatry who increased effexor xr 150mg  daily.  He reports taking his meds (though had clearly detectable VL in December. We also know that pharmacy was having difficulty contacting him to send meds by mail)  He reports that he needs zofran refill  Has living related donor, interested in getting kidney  Still working on [employer].  Outpatient Encounter Prescriptions as of 04/03/2016  Medication Sig  . [DISCONTINUED] hydrocortisone (ANUSOL-HC) 25 MG suppository Place 1 suppository (25 mg total) rectally 2 (two) times daily. For no more than 7 days  . abacavir (ZIAGEN) 300 MG tablet Take 2 tablets (600 mg total) by mouth daily with supper.  [name] acetaminophen (TYLENOL) 500 MG tablet Take 1,000 mg by mouth every 6 (six) hours as needed (pain).  [name] albuterol (PROVENTIL HFA;VENTOLIN HFA) 108 (90 Base) MCG/ACT inhaler Inhale 2 puffs into the lungs every 6 (six) hours as needed for wheezing or shortness of breath.  . Ascorbic Acid (VITAMIN C) 250 MG CHEW Chew 250 mg by mouth daily.  [name] azithromycin (ZITHROMAX) 600 MG tablet Take 2 tablets (1,200 mg total) by mouth once a week. On Mondays  . CARTIA XT 180 MG 24 hr capsule Take 180 mg by mouth daily.  . carvedilol (COREG) 25 MG tablet Take 25 mg by mouth 2 (two) times daily.  . Cyanocobalamin 2500 MCG CHEW Chew 5,000 mcg by mouth daily.  . dapsone 100 MG tablet Take 1 tablet (100 mg total) by mouth daily.  . darunavir-cobicistat (PREZCOBIX) 800-150 MG tablet Take 1 tablet by mouth every evening. Swallow whole. Do  NOT crush, break or chew tablets. Take with food.  . dolutegravir (TIVICAY) 50 MG tablet Take 1 tablet (50 mg total) by mouth every evening.  . hydrALAZINE (APRESOLINE) 100 MG tablet Take 0.5 tablets (50 mg total) by mouth 3 (three) times daily. 6:30am, 2:30pm, 11pm - for high blood pressure  . levETIRAcetam (KEPPRA) 250 MG tablet Take 1 tablet (250 mg) by mouth on Tuesday, Thursday, Saturday at 7pm (Patient taking differently: Take 250 mg by mouth See admin instructions. Take 1 tablet (250 mg) by mouth on Tuesday, Thursday, Saturday at 7pm)  . levETIRAcetam (KEPPRA) 500 MG tablet Take 1 tablet (500 mg total) by mouth daily.  [name] losartan (COZAAR) 100 MG tablet Take 100 mg by mouth at bedtime.  . ondansetron (ZOFRAN ODT) 8 MG disintegrating tablet Take 1 tablet (8 mg total) by mouth every 8 (eight) hours as needed for nausea or vomiting. (Patient not taking: Reported on 03/15/2016)  . sucroferric oxyhydroxide (VELPHORO) 500 MG chewable tablet Chew 500 mg by mouth See admin instructions. Chew 1 tablet (500 mg) by mouth after every meal (2-3 times daily)  . venlafaxine XR (EFFEXOR-XR) 150 MG 24 hr capsule Take 1 capsule (150 mg total) by mouth daily.  . [DISCONTINUED] docusate sodium (COLACE) 100 MG capsule Take 1 capsule (100 mg total) by mouth daily as needed for mild constipation.   No facility-administered encounter medications on file as of 04/03/2016.  Patient Active Problem List   Diagnosis Date Noted  . Depression 03/24/2016  . Breast lump or mass   . Rectal bleeding   . GI bleed 03/15/2016  . Hyperkalemia   . New onset seizure ([place]) 03/13/2016  . Symptomatic anemia 02/01/2016  . Dysuria 01/18/2016  . Hematochezia 01/09/2016  . Postoperative anemia due to acute blood loss 01/09/2016  . Seizure disorder ([place]) 12/29/2015  . Intussusception ([place]) 12/28/2015  . Hypertension 12/28/2015  . Otitis externa of both ears 11/21/2015  . AIDS (acquired immunodeficiency syndrome), CD4 <=200  ([place])   . Meningitis   . Cephalalgia   . Blurry vision, bilateral   . Headache   . Hypertensive urgency   . New onset of headache in immunocompromised patient ([place]) 07/06/2015  . ESRD on dialysis ([place]) 04/27/2015  . CAP (community acquired pneumonia) 02/25/2015  . ESRD (end stage renal disease) ([place]) 02/16/2015  . History of DVT (deep vein thrombosis) 01/12/2015  . Candidal esophagitis ([place])   . Constipation   . Nausea without vomiting 12/20/2014  . Abscess of right thigh 12/19/2014  . Chronic kidney disease (CKD), stage IV (severe) ([place]) 12/19/2014  . 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 Anemia due to end stage renal disease ([place]) 12/19/2014  . Hyponatremia 12/19/2014  . Community acquired pneumonia 07/30/2014  . Anemia of chronic disease 07/30/2014  . Acute on chronic renal failure ([place])   . 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 Human immunodeficiency virus (HIV) disease ([place]) 12/11/2013  . CMV (cytomegalovirus infection) ([place]) 12/07/2013  . Protein-calorie malnutrition, severe ([place]) 12/06/2013  . 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 DVT (deep venous thrombosis) ([place]) 11/24/2013  . Asthma, chronic 11/24/2013  . Acquired immune deficiency syndrome ([place]) 11/14/2013  . AKI (acute kidney injury) ([place]) 11/11/2013  . Elevated liver enzymes 11/09/2013  . History of shingles 11/09/2013  . Obesity 11/09/2013  . Abdominal pain 11/07/2013  . Asthma      Health Maintenance Due  Topic Date Due  . TETANUS/TDAP  01/21/2002  . INFLUENZA VACCINE  09/28/2015     Review of Systems Less headaches at HD, no bloody stools. 10 point ros is negative Physical Exam   BP (!) 147/102   Pulse (!) 109   Temp 98 F (36.7 C) (Oral)   Ht 5\' 7"  (1.702 m)   Wt 158 lb (71.7 kg)   BMI 24.75 kg/m   Physical Exam  Constitutional: He is oriented to person, place, and time. He appears chronically ill. No distress.  HENT:  Mouth/Throat:  Oropharynx is clear and moist. No oropharyngeal exudate.  Cardiovascular: Normal rate, regular rhythm and normal heart sounds. Exam reveals no gallop and no friction rub.  No murmur heard.  Pulmonary/Chest: Effort normal and breath sounds normal. No respiratory distress. He has no wheezes.  Abdominal: Soft. Bowel sounds are normal. He exhibits no distension. There is no tenderness.  Lymphadenopathy:  He has no cervical adenopathy.  Neurological: He is alert and oriented to person, place, and time.  Ext: left arm has tortuous vessels from HD Psychiatric: He has a normal mood and affect. His behavior is normal.    Lab Results  Component Value Date   CD4TCELL 4 (L) 03/15/2016   Lab Results  Component Value Date   CD4TABS 20 (L) 03/15/2016   CD4TABS 20 (L) 02/01/2016   CD4TABS 10 (L) 01/03/2016   Lab Results  Component Value Date   HIV1RNAQUANT 455,000 02/01/2016   Lab Results  Component  Value Date   HEPBSAB Non Reactive 12/30/2015   Lab Results  Component Value Date   LABRPR NON REAC 12/06/2015    CBC Lab Results  Component Value Date   WBC 5.1 03/24/2016   RBC 3.14 (L) 03/24/2016   HGB 9.5 (L) 03/21/2016   HCT 30.0 (L) 03/24/2016   PLT 201 03/24/2016   MCV 96 03/24/2016   MCH 30.3 03/24/2016   MCHC 31.7 03/24/2016   RDW 16.2 (H) 03/24/2016   LYMPHSABS 0.6 (L) 03/15/2016   MONOABS 0.3 03/15/2016   EOSABS 0.0 03/15/2016    BMET Lab Results  Component Value Date   NA 134 (L) 03/21/2016   K 3.7 03/21/2016   CL 100 (L) 03/21/2016   CO2 27 03/21/2016   GLUCOSE 96 03/21/2016   BUN 27 (H) 03/21/2016   CREATININE 11.76 (H) 03/21/2016   CALCIUM 8.5 (L) 03/21/2016   GFRNONAA 5 (L) 03/21/2016   GFRAA 6 (L) 03/21/2016      Assessment and Plan  hiv disease/advanced AIDS = has still poorly controlled but he states he is taking meds. Will need to get genotype to readjust his regimen. He is reluctant to get labs today. Will place order in epic when he decides to  get labs  Poor insight = unclear how to convince him to have better adherence. Possibly discussion of having kidney transplant would motivate him to take meds.   Anti nausea = refill zofran  oi proph = continue with dapsone and aizthromycin

## 2016-04-03 NOTE — Addendum Note (Signed)
Addended by: Dinah Beers on: 04/03/2016 11:31 PM   Modules accepted: Orders

## 2016-04-04 DIAGNOSIS — D631 Anemia in chronic kidney disease: Secondary | ICD-10-CM | POA: Diagnosis not present

## 2016-04-04 DIAGNOSIS — R509 Fever, unspecified: Secondary | ICD-10-CM | POA: Diagnosis not present

## 2016-04-04 DIAGNOSIS — N186 End stage renal disease: Secondary | ICD-10-CM | POA: Diagnosis not present

## 2016-04-04 DIAGNOSIS — N2581 Secondary hyperparathyroidism of renal origin: Secondary | ICD-10-CM | POA: Diagnosis not present

## 2016-04-06 DIAGNOSIS — R509 Fever, unspecified: Secondary | ICD-10-CM | POA: Diagnosis not present

## 2016-04-06 DIAGNOSIS — D631 Anemia in chronic kidney disease: Secondary | ICD-10-CM | POA: Diagnosis not present

## 2016-04-06 DIAGNOSIS — N2581 Secondary hyperparathyroidism of renal origin: Secondary | ICD-10-CM | POA: Diagnosis not present

## 2016-04-06 DIAGNOSIS — N186 End stage renal disease: Secondary | ICD-10-CM | POA: Diagnosis not present

## 2016-04-08 DIAGNOSIS — N2581 Secondary hyperparathyroidism of renal origin: Secondary | ICD-10-CM | POA: Diagnosis not present

## 2016-04-08 DIAGNOSIS — R509 Fever, unspecified: Secondary | ICD-10-CM | POA: Diagnosis not present

## 2016-04-08 DIAGNOSIS — D631 Anemia in chronic kidney disease: Secondary | ICD-10-CM | POA: Diagnosis not present

## 2016-04-08 DIAGNOSIS — N186 End stage renal disease: Secondary | ICD-10-CM | POA: Diagnosis not present

## 2016-04-10 ENCOUNTER — Other Ambulatory Visit: Payer: Self-pay

## 2016-04-11 DIAGNOSIS — N2581 Secondary hyperparathyroidism of renal origin: Secondary | ICD-10-CM | POA: Diagnosis not present

## 2016-04-11 DIAGNOSIS — N186 End stage renal disease: Secondary | ICD-10-CM | POA: Diagnosis not present

## 2016-04-11 DIAGNOSIS — D631 Anemia in chronic kidney disease: Secondary | ICD-10-CM | POA: Diagnosis not present

## 2016-04-11 DIAGNOSIS — R509 Fever, unspecified: Secondary | ICD-10-CM | POA: Diagnosis not present

## 2016-04-13 DIAGNOSIS — R509 Fever, unspecified: Secondary | ICD-10-CM | POA: Diagnosis not present

## 2016-04-13 DIAGNOSIS — D631 Anemia in chronic kidney disease: Secondary | ICD-10-CM | POA: Diagnosis not present

## 2016-04-13 DIAGNOSIS — N2581 Secondary hyperparathyroidism of renal origin: Secondary | ICD-10-CM | POA: Diagnosis not present

## 2016-04-13 DIAGNOSIS — N186 End stage renal disease: Secondary | ICD-10-CM | POA: Diagnosis not present

## 2016-04-15 DIAGNOSIS — R509 Fever, unspecified: Secondary | ICD-10-CM | POA: Diagnosis not present

## 2016-04-15 DIAGNOSIS — D631 Anemia in chronic kidney disease: Secondary | ICD-10-CM | POA: Diagnosis not present

## 2016-04-15 DIAGNOSIS — N186 End stage renal disease: Secondary | ICD-10-CM | POA: Diagnosis not present

## 2016-04-15 DIAGNOSIS — N2581 Secondary hyperparathyroidism of renal origin: Secondary | ICD-10-CM | POA: Diagnosis not present

## 2016-04-18 DIAGNOSIS — N2581 Secondary hyperparathyroidism of renal origin: Secondary | ICD-10-CM | POA: Diagnosis not present

## 2016-04-18 DIAGNOSIS — D631 Anemia in chronic kidney disease: Secondary | ICD-10-CM | POA: Diagnosis not present

## 2016-04-18 DIAGNOSIS — R509 Fever, unspecified: Secondary | ICD-10-CM | POA: Diagnosis not present

## 2016-04-18 DIAGNOSIS — N186 End stage renal disease: Secondary | ICD-10-CM | POA: Diagnosis not present

## 2016-04-19 ENCOUNTER — Ambulatory Visit: Payer: Medicare Other | Attending: Internal Medicine | Admitting: Internal Medicine

## 2016-04-19 ENCOUNTER — Other Ambulatory Visit: Payer: Self-pay | Admitting: Internal Medicine

## 2016-04-19 ENCOUNTER — Encounter: Payer: Self-pay | Admitting: Internal Medicine

## 2016-04-19 VITALS — BP 177/91 | HR 110 | Temp 98.7°F | Resp 18 | Ht 67.0 in | Wt 147.8 lb

## 2016-04-19 DIAGNOSIS — G40909 Epilepsy, unspecified, not intractable, without status epilepticus: Secondary | ICD-10-CM | POA: Insufficient documentation

## 2016-04-19 DIAGNOSIS — N186 End stage renal disease: Secondary | ICD-10-CM | POA: Insufficient documentation

## 2016-04-19 DIAGNOSIS — Z86718 Personal history of other venous thrombosis and embolism: Secondary | ICD-10-CM | POA: Diagnosis not present

## 2016-04-19 DIAGNOSIS — B20 Human immunodeficiency virus [HIV] disease: Secondary | ICD-10-CM | POA: Diagnosis not present

## 2016-04-19 DIAGNOSIS — Z992 Dependence on renal dialysis: Secondary | ICD-10-CM

## 2016-04-19 DIAGNOSIS — Z09 Encounter for follow-up examination after completed treatment for conditions other than malignant neoplasm: Secondary | ICD-10-CM | POA: Diagnosis not present

## 2016-04-19 DIAGNOSIS — Z880 Allergy status to penicillin: Secondary | ICD-10-CM | POA: Diagnosis not present

## 2016-04-19 DIAGNOSIS — Z882 Allergy status to sulfonamides status: Secondary | ICD-10-CM | POA: Insufficient documentation

## 2016-04-19 DIAGNOSIS — G4733 Obstructive sleep apnea (adult) (pediatric): Secondary | ICD-10-CM | POA: Insufficient documentation

## 2016-04-19 DIAGNOSIS — Z5189 Encounter for other specified aftercare: Secondary | ICD-10-CM | POA: Insufficient documentation

## 2016-04-19 DIAGNOSIS — Z79899 Other long term (current) drug therapy: Secondary | ICD-10-CM | POA: Diagnosis not present

## 2016-04-19 DIAGNOSIS — I120 Hypertensive chronic kidney disease with stage 5 chronic kidney disease or end stage renal disease: Secondary | ICD-10-CM | POA: Insufficient documentation

## 2016-04-19 DIAGNOSIS — Z881 Allergy status to other antibiotic agents status: Secondary | ICD-10-CM | POA: Insufficient documentation

## 2016-04-19 MED ORDER — ALBUTEROL SULFATE HFA 108 (90 BASE) MCG/ACT IN AERS: 2.0000 | INHALATION_SPRAY | Freq: Four times a day (QID) | RESPIRATORY_TRACT | 3 refills | Status: DC | PRN

## 2016-04-19 NOTE — Patient Instructions (Signed)
DASH Eating Plan DASH stands for "Dietary Approaches to Stop Hypertension." The DASH eating plan is a healthy eating plan that has been shown to reduce high blood pressure (hypertension). Additional health benefits may include reducing the risk of type 2 diabetes mellitus, heart disease, and stroke. The DASH eating plan may also help with weight loss. What do I need to know about the DASH eating plan? For the DASH eating plan, you will follow these general guidelines:  Choose foods with less than 150 milligrams of sodium per serving (as listed on the food label).  Use salt-free seasonings or herbs instead of table salt or sea salt.  Check with your health care provider or pharmacist before using salt substitutes.  Eat lower-sodium products. These are often labeled as "low-sodium" or "no salt added."  Eat fresh foods. Avoid eating a lot of canned foods.  Eat more vegetables, fruits, and low-fat dairy products.  Choose whole grains. Look for the word "whole" as the first word in the ingredient list.  Choose fish and skinless chicken or turkey more often than red meat. Limit fish, poultry, and meat to 6 oz (170 g) each day.  Limit sweets, desserts, sugars, and sugary drinks.  Choose heart-healthy fats.  Eat more home-cooked food and less restaurant, buffet, and fast food.  Limit fried foods.  Do not fry foods. Cook foods using methods such as baking, boiling, grilling, and broiling instead.  When eating at a restaurant, ask that your food be prepared with less salt, or no salt if possible. What foods can I eat? Seek help from a dietitian for individual calorie needs. Grains  Whole grain or whole wheat bread. Brown rice. Whole grain or whole wheat pasta. Quinoa, bulgur, and whole grain cereals. Low-sodium cereals. Corn or whole wheat flour tortillas. Whole grain cornbread. Whole grain crackers. Low-sodium crackers. Vegetables  Fresh or frozen vegetables (raw, steamed, roasted, or  grilled). Low-sodium or reduced-sodium tomato and vegetable juices. Low-sodium or reduced-sodium tomato sauce and paste. Low-sodium or reduced-sodium canned vegetables. Fruits  All fresh, canned (in natural juice), or frozen fruits. Meat and Other Protein Products  Ground beef (85% or leaner), grass-fed beef, or beef trimmed of fat. Skinless chicken or turkey. Ground chicken or turkey. Pork trimmed of fat. All fish and seafood. Eggs. Dried beans, peas, or lentils. Unsalted nuts and seeds. Unsalted canned beans. Dairy  Low-fat dairy products, such as skim or 1% milk, 2% or reduced-fat cheeses, low-fat ricotta or cottage cheese, or plain low-fat yogurt. Low-sodium or reduced-sodium cheeses. Fats and Oils  Tub margarines without trans fats. Light or reduced-fat mayonnaise and salad dressings (reduced sodium). Avocado. Safflower, olive, or canola oils. Natural peanut or almond butter. Other  Unsalted popcorn and pretzels. The items listed above may not be a complete list of recommended foods or beverages. Contact your dietitian for more options.  What foods are not recommended? Grains  White bread. White pasta. White rice. Refined cornbread. Bagels and croissants. Crackers that contain trans fat. Vegetables  Creamed or fried vegetables. Vegetables in a cheese sauce. Regular canned vegetables. Regular canned tomato sauce and paste. Regular tomato and vegetable juices. Fruits  Canned fruit in light or heavy syrup. Fruit juice. Meat and Other Protein Products  Fatty cuts of meat. Ribs, chicken wings, bacon, sausage, bologna, salami, chitterlings, fatback, hot dogs, bratwurst, and packaged luncheon meats. Salted nuts and seeds. Canned beans with salt. Dairy  Whole or 2% milk, cream, half-and-half, and cream cheese. Whole-fat or sweetened yogurt. Full-fat cheeses   or blue cheese. Nondairy creamers and whipped toppings. Processed cheese, cheese spreads, or cheese curds. Condiments  Onion and garlic  salt, seasoned salt, table salt, and sea salt. Canned and packaged gravies. Worcestershire sauce. Tartar sauce. Barbecue sauce. Teriyaki sauce. Soy sauce, including reduced sodium. Steak sauce. Fish sauce. Oyster sauce. Cocktail sauce. Horseradish. Ketchup and mustard. Meat flavorings and tenderizers. Bouillon cubes. Hot sauce. Tabasco sauce. Marinades. Taco seasonings. Relishes. Fats and Oils  Butter, stick margarine, lard, shortening, ghee, and bacon fat. Coconut, palm kernel, or palm oils. Regular salad dressings. Other  Pickles and olives. Salted popcorn and pretzels. The items listed above may not be a complete list of foods and beverages to avoid. Contact your dietitian for more information.  Where can I find more information? National Heart, Lung, and Blood Institute: travelstabloid.com This information is not intended to replace advice given to you by your health care provider. Make sure you discuss any questions you have with your health care provider. Document Released: 02/02/2011 Document Revised: 07/22/2015 Document Reviewed: 12/18/2012 Elsevier Interactive Patient Education  2017 [employer] Hypertension Hypertension, commonly called high blood pressure, is when the force of blood pumping through your arteries is too strong. Your arteries are the blood vessels that carry blood from your heart throughout your body. A blood pressure reading consists of a higher number over a lower number, such as 110/72. The higher number (systolic) is the pressure inside your arteries when your heart pumps. The lower number (diastolic) is the pressure inside your arteries when your heart relaxes. Ideally you want your blood pressure below 120/80. Hypertension forces your heart to work harder to pump blood. Your arteries may become narrow or stiff. Having untreated or uncontrolled hypertension can cause heart attack, stroke, kidney disease, and other problems. What  increases the risk? Some risk factors for high blood pressure are controllable. Others are not. Risk factors you cannot control include:  Race. You may be at higher risk if you are African American.  Age. Risk increases with age.  Gender. Men are at higher risk than women before age 80 years. After age 60, women are at higher risk than men. Risk factors you can control include:  Not getting enough exercise or physical activity.  Being overweight.  Getting too much fat, sugar, calories, or salt in your diet.  Drinking too much alcohol. What are the signs or symptoms? Hypertension does not usually cause signs or symptoms. Extremely high blood pressure (hypertensive crisis) may cause headache, anxiety, shortness of breath, and nosebleed. How is this diagnosed? To check if you have hypertension, your health care provider will measure your blood pressure while you are seated, with your arm held at the level of your heart. It should be measured at least twice using the same arm. Certain conditions can cause a difference in blood pressure between your right and left arms. A blood pressure reading that is higher than normal on one occasion does not mean that you need treatment. If it is not clear whether you have high blood pressure, you may be asked to return on a different day to have your blood pressure checked again. Or, you may be asked to monitor your blood pressure at home for 1 or more weeks. How is this treated? Treating high blood pressure includes making lifestyle changes and possibly taking medicine. Living a healthy lifestyle can help lower high blood pressure. You may need to change some of your habits. Lifestyle changes may include:  Following the DASH diet. This  diet is high in fruits, vegetables, and whole grains. It is low in salt, red meat, and added sugars.  Keep your sodium intake below 2,300 mg per day.  Getting at least 30-45 minutes of aerobic exercise at least 4 times  per week.  Losing weight if necessary.  Not smoking.  Limiting alcoholic beverages.  Learning ways to reduce stress. Your health care provider may prescribe medicine if lifestyle changes are not enough to get your blood pressure under control, and if one of the following is true:  You are 53-62 years of age and your systolic blood pressure is above 140.  You are 9 years of age or older, and your systolic blood pressure is above 150.  Your diastolic blood pressure is above 90.  You have diabetes, and your systolic blood pressure is over 269 or your diastolic blood pressure is over 90.  You have kidney disease and your blood pressure is above 140/90.  You have heart disease and your blood pressure is above 140/90. Your personal target blood pressure may vary depending on your medical conditions, your age, and other factors. Follow these instructions at home:  Have your blood pressure rechecked as directed by your health care provider.  Take medicines only as directed by your health care provider. Follow the directions carefully. Blood pressure medicines must be taken as prescribed. The medicine does not work as well when you skip doses. Skipping doses also puts you at risk for problems.  Do not smoke.  Monitor your blood pressure at home as directed by your health care provider. Contact a health care provider if:  You think you are having a reaction to medicines taken.  You have recurrent headaches or feel dizzy.  You have swelling in your ankles.  You have trouble with your vision. Get help right away if:  You develop a severe headache or confusion.  You have unusual weakness, numbness, or feel faint.  You have severe chest or abdominal pain.  You vomit repeatedly.  You have trouble breathing. This information is not intended to replace advice given to you by your health care provider. Make sure you discuss any questions you have with your health care  provider. Document Released: 02/13/2005 Document Revised: 07/22/2015 Document Reviewed: 12/06/2012 Elsevier Interactive Patient Education  2017 [place] Kidney Disease End-stage kidney disease occurs when the kidneys are so damaged that they cannot do their job. The kidneys are two organs that do many important jobs in the body, which include:  Removing wastes and extra fluids from the blood.  Making hormones that maintain the amount of fluid in your tissues and blood vessels.  Maintaining the right amount of fluids and chemicals in the body. When the kidneys are damaged and cannot do their job, life-threatening problems occur. Without the help of the kidneys, toxins build up in the blood. In end-stage kidney disease, the kidneys cannot get better. What are the causes? End-stage kidney disease usually occurs when a long-lasting (chronic) kidney disease gets worse. It may also occur after the kidneys are suddenly damaged (acute kidney injury). What increases the risk? This condition is more likely to develop in people who are:  Older than age 16.  Male.  Of African-American descent.  Current smokers or former smokers.  Obese. You may also have an increased risk for end-stage kidney disease if you:  Have a family history of chronic kidney disease (CKD).  Have had kidney disease for many years.  Have other longstanding medical  conditions that affect the kidneys, such as:  Cardiovascular disease including high blood pressure.  Diabetes.  Certain diseases that affect the immune system. What are the signs or symptoms?  Swelling (edema) of the face, legs, ankles, or feet.  Numbness, tingling, or loss of feeling (sensation) in your hands or feet.  Tiredness (lethargy).  Nausea or vomiting.  Confusion, trouble concentrating, or loss of consciousness.  Chest pain.  Shortness of breath.  Little to no urine production.  Muscle twitches and cramps,  especially in the legs.  Constant itchiness.  Loss of appetite.  Pale skin and tissue lining your eyelids (conjunctiva).  Headaches.  Abnormally dark or light skin.  Decrease in muscle size (muscle wasting).  Easy bruising.  Frequent hiccups.  Stopping of menstruation in women.  Seizures. How is this diagnosed? Your health care provider will measure your blood pressure and do some tests. These may include:  Urine tests.  Blood tests.  Imaging tests.  A test in which a sample of tissue is removed from the kidneys to be looked at under a microscope (kidney biopsy). How is this treated? There are two treatments for end-stage kidney disease:  A procedure that removes toxic wastes from the body (dialysis). Depending on the type of dialysis you choose, it may be performed more than one time a day (peritoneal dialysis) or several times a week (hemodialysis).  Surgery toreceive a new kidney (kidney transplant). In addition to having dialysis or a kidney transplant, you may need to take medicines:  To control high blood pressure (hypertension).  To control cholesterol.  To maintain healthy electrolyte levels in your blood. You may also be given a specific diet to follow that includes requirements or limits for:  Salt (sodium).  Protein.  Phosphorous.  Potassium.  Calcium. Follow these instructions at home:  Follow your prescribed diet.  Take over-the-counter and prescription medicines only as told by your health care provider.  Do not take any new medicines unless approved by your health care provider. Many medicines can worsen your kidney damage.  Do not take any vitamin and mineral supplements unless approved by your health care provider. Many nutritional supplements can worsen your kidney damage.  The dose of some medicines that you take may need to be adjusted.  Do not use any tobacco products, such as cigarettes, chewing tobacco, and e-cigarettes. If  you need help quitting, ask your health care provider.  Keep all follow-up visits as told by your health care provider. This is important.  Keep track of your blood pressure. Report changes in your blood pressure as told by your health care provider.  Achieve and maintain a healthy weight. If you need help with this, ask your health care provider.  Start or continue an exercise plan. Try to exercise at least 30 minutes a day, 5 days a week.  Stay current with immunizations as told by your health care provider. Where to find more information:  American Association of Kidney Patients: BombTimer.gl  National Kidney Foundation: www.kidney.[place]: https://mathis.com/  [facility]: www.lifeoptions.org and www.kidneyschool.org Contact a health care provider if:  Your symptoms get worse.  You develop new symptoms. Get help right away if:  You have weakness in an arm or leg on one side of your body.  You have difficulty speaking or you are slurring your speech.  You have a sudden change in your vision.  You have a sudden, severe headache.  You have a sudden weight increase.  You have difficulty breathing.  Your symptoms suddenly get worse. This information is not intended to replace advice given to you by your health care provider. Make sure you discuss any questions you have with your health care provider. Document Released: 05/06/2003 Document Revised: 07/22/2015 Document Reviewed: 10/13/2011 Elsevier Interactive Patient Education  2017 [employer].

## 2016-04-19 NOTE — Progress Notes (Signed)
Patient is here for HFU HTN  Patient denies pain at this time.  Patient has taken medication today. Patient has eaten today.  Patient request a refill on his inhaler.

## 2016-04-19 NOTE — Telephone Encounter (Signed)
Patient does not follow in our clinic and [facility]. He was seen here for hospital follow up. Please confirm if he wants to follow here or if he wants to follow at [facility]

## 2016-04-19 NOTE — Progress Notes (Signed)
Jim Wyatt, is a 36 y.o. male  ZOX:[id number]  WJX:[id number]  DOB - [birth year]  Chief Complaint  Patient presents with  . Hospitalization Follow-up  . Hypertension      Subjective:   Jim Wyatt is a 36 y.o. 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 Patient was recently admitted to the hospital for Rectal bleeding and pain. CT abdomen showed probable colonic wall thickening suggesting colitis and questionable gallbladder wall thickening. On admission he was acutely anemic with Hb at 6.4 and required several transfusions secondary to recurrent GIB. He had a colonoscopy with biopsy during admission which revealed large non-thrombosed external and internal hemorrhoids, benign post-surgical changes at anastomosis site and diverticulosis. No blood was observed during procedure. Following the colonoscopy he had resolution of his rectal bleeding with stabilization of his hemoglobin. Biopsies taken during colonoscopy returned with benign nonspecific inflammation at anastomosis site. Patient was discharged home in a hemodynamically stable condition. He is here today for hospital discharge follow up. He has no new complaint. Patient has No headache, No chest pain, No abdominal pain - No Nausea, No new weakness tingling or numbness.  Problem  Osa (Obstructive Sleep Apnea)  Hospital Discharge Follow-Up    ALLERGIES: Allergies  Allergen Reactions  . Bactrim [Sulfamethoxazole-Trimethoprim] Other (See Comments)    Renal failure  . Vancomycin Other (See Comments)    Patient states vancomycin caused kidney injury  . Reglan [Metoclopramide] Other (See Comments)    Causes ticks/ jerks  . Amlodipine Palpitations and Other (See Comments)    fatigue  . Augmentin [Amoxicillin-Pot Clavulanate] Hives    PAST MEDICAL HISTORY: Past Medical  History:  Diagnosis Date  . Asthma   . CAP (community acquired pneumonia) 10/2013  . Cellulitis 11/2013   LLE  . DVT (deep venous thrombosis) ([place]) 10/2013   BLE  . ESRD on hemodialysis ([place]) 01/2015   biopsy proven FSGS with ATN on renal bx from Sept 2015. Went on HD Dec 2016.   [name] Fever   . Headache   . HIV infection ([place]) dx'd 10/2013  . Hypertension   . Leg pain   . Multiple environmental allergies    "trees, dogs, cats"  . Multiple food allergies   . Shingles < 2010  . 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 Authorizing Provider  abacavir (ZIAGEN) 300 MG tablet Take 2 tablets (600 mg total) by mouth daily with supper. 12/14/15  Yes Carlyle Basques, MD  acetaminophen (TYLENOL) 500 MG tablet Take 1,000 mg by mouth every 6 (six) hours as needed (pain).   Yes Historical Provider, MD  albuterol (PROVENTIL HFA;VENTOLIN HFA) 108 (90 Base) MCG/ACT inhaler Inhale 2 puffs into the lungs every 6 (six) hours as needed for wheezing or shortness of breath. 04/19/16  Yes [name], MD  Ascorbic Acid (VITAMIN C) 250 MG CHEW Chew 250 mg by mouth daily.   Yes Historical Provider, MD  Cyanocobalamin 2500 MCG CHEW Chew 5,000 mcg by mouth daily.   Yes Historical Provider, MD  dapsone 100 MG tablet Take 1 tablet (100 mg total) by mouth daily. 11/24/15  Yes Alexa Angela Burke, MD  darunavir-cobicistat (PREZCOBIX) 800-150 MG tablet Take 1 tablet by mouth every evening. Swallow whole. Do NOT crush, break or chew tablets. Take  with food. 12/14/15  Yes Carlyle Basques, MD  dolutegravir (TIVICAY) 50 MG tablet Take 1 tablet (50 mg total) by mouth every evening. 12/14/15  Yes Carlyle Basques, MD  hydrALAZINE (APRESOLINE) 100 MG tablet Take 0.5 tablets (50 mg total) by mouth 3 (three) times daily. 6:30am, 2:30pm, 11pm - for high blood pressure 03/21/16  Yes Burgess Estelle, MD  levETIRAcetam (KEPPRA) 250 MG tablet  Take 1 tablet (250 mg) by mouth on Tuesday, Thursday, Saturday at 7pm Patient taking differently: Take 250 mg by mouth See admin instructions. Take 1 tablet (250 mg) by mouth on Tuesday, Thursday, Saturday at 7pm 03/13/16  Yes Cameron Sprang, MD  levETIRAcetam (KEPPRA) 500 MG tablet Take 1 tablet (500 mg total) by mouth daily. 03/21/16  Yes Burgess Estelle, MD  losartan (COZAAR) 100 MG tablet Take 100 mg by mouth at bedtime.   Yes Historical Provider, MD  ondansetron (ZOFRAN ODT) 8 MG disintegrating tablet Take 1 tablet (8 mg total) by mouth every 8 (eight) hours as needed for nausea or vomiting. 04/03/16  Yes Carlyle Basques, MD  sucroferric oxyhydroxide (VELPHORO) 500 MG chewable tablet Chew 500 mg by mouth See admin instructions. Chew 1 tablet (500 mg) by mouth after every meal (2-3 times daily)   Yes Historical Provider, MD  venlafaxine XR (EFFEXOR-XR) 150 MG 24 hr capsule Take 1 capsule (150 mg total) by mouth daily. 03/22/16  Yes Burgess Estelle, MD  CARTIA XT 300 MG 24 hr capsule TK ONE C PO ONCE A DAY 02/17/16   Historical Provider, MD  DOK 100 MG capsule TK ONE C PO D PRN FOR MILD CONSTIPATION 03/21/16   Historical Provider, MD  labetalol (NORMODYNE) 200 MG tablet  04/19/16   Historical Provider, MD    Objective:   Vitals:   04/19/16 0915  BP: (!) 177/91  Pulse: (!) 110  Resp: 18  Temp: 98.7 F (37.1 C)  TempSrc: Oral  SpO2: 95%  Weight: 147 lb 12.8 oz (67 kg)  Height: 5\' 7"  (1.702 m)   Exam General appearance : Awake, alert, not in any distress. Speech Clear. Not toxic looking HEENT: Atraumatic and Normocephalic, pupils equally reactive to light and accomodation Neck: Supple, no JVD. No cervical lymphadenopathy.  Chest: Good air entry bilaterally, no added sounds  CVS: S1 S2 regular, no murmurs.  Abdomen: Bowel sounds present, Non tender and not distended with no gaurding, rigidity or rebound. Extremities: B/L Lower Ext shows no edema, both legs are warm to touch Neurology: Awake  alert, and oriented X 3, CN II-XII intact, Non focal Skin: No Rash  Data Review No results found for: HGBA1C  Assessment & Plan   1. ESRD on dialysis [facility])  - Continue HD as scheduled  2. Human immunodeficiency virus (HIV) disease ([place])  - Continue follow up with ID specialist  3. OSA (obstructive sleep apnea)  - Sleep study ordered  4. Hospital discharge follow-up  - Patient stable post-discharge.   Patient have been counseled extensively about nutrition and exercise.   Return in about 3 months (around 07/17/2016) for Follow up HTN, CKD/ESRD.  The patient was given clear instructions to go to ER or return to medical center if symptoms don't improve, worsen or new problems develop. The patient verbalized understanding. The patient was told to call to get lab results if they haven't heard anything in the next week.   This note has been created with Surveyor, quantity. Any transcriptional errors are unintentional.  [name], MD, [place], Karilyn Cota, [place] [facility] [place], [place]   04/19/2016, 9:39 AM

## 2016-04-20 DIAGNOSIS — N186 End stage renal disease: Secondary | ICD-10-CM | POA: Diagnosis not present

## 2016-04-20 DIAGNOSIS — R509 Fever, unspecified: Secondary | ICD-10-CM | POA: Diagnosis not present

## 2016-04-20 DIAGNOSIS — N2581 Secondary hyperparathyroidism of renal origin: Secondary | ICD-10-CM | POA: Diagnosis not present

## 2016-04-20 DIAGNOSIS — D631 Anemia in chronic kidney disease: Secondary | ICD-10-CM | POA: Diagnosis not present

## 2016-04-21 ENCOUNTER — Ambulatory Visit (HOSPITAL_BASED_OUTPATIENT_CLINIC_OR_DEPARTMENT_OTHER): Payer: BLUE CROSS/BLUE SHIELD

## 2016-04-22 DIAGNOSIS — D631 Anemia in chronic kidney disease: Secondary | ICD-10-CM | POA: Diagnosis not present

## 2016-04-22 DIAGNOSIS — N186 End stage renal disease: Secondary | ICD-10-CM | POA: Diagnosis not present

## 2016-04-22 DIAGNOSIS — R509 Fever, unspecified: Secondary | ICD-10-CM | POA: Diagnosis not present

## 2016-04-22 DIAGNOSIS — N2581 Secondary hyperparathyroidism of renal origin: Secondary | ICD-10-CM | POA: Diagnosis not present

## 2016-04-25 DIAGNOSIS — D631 Anemia in chronic kidney disease: Secondary | ICD-10-CM | POA: Diagnosis not present

## 2016-04-25 DIAGNOSIS — R509 Fever, unspecified: Secondary | ICD-10-CM | POA: Diagnosis not present

## 2016-04-25 DIAGNOSIS — N2581 Secondary hyperparathyroidism of renal origin: Secondary | ICD-10-CM | POA: Diagnosis not present

## 2016-04-25 DIAGNOSIS — N186 End stage renal disease: Secondary | ICD-10-CM | POA: Diagnosis not present

## 2016-04-26 DIAGNOSIS — Z992 Dependence on renal dialysis: Secondary | ICD-10-CM | POA: Diagnosis not present

## 2016-04-26 DIAGNOSIS — N049 Nephrotic syndrome with unspecified morphologic changes: Secondary | ICD-10-CM | POA: Diagnosis not present

## 2016-04-26 DIAGNOSIS — N186 End stage renal disease: Secondary | ICD-10-CM | POA: Diagnosis not present

## 2016-04-27 ENCOUNTER — Emergency Department (HOSPITAL_COMMUNITY): Payer: Medicare Other

## 2016-04-27 ENCOUNTER — Emergency Department (HOSPITAL_COMMUNITY)
Admission: EM | Admit: 2016-04-27 | Discharge: 2016-04-27 | Disposition: A | Discharge status: Home / Self Care | Payer: Medicare Other | Attending: Emergency Medicine | Admitting: Emergency Medicine

## 2016-04-27 DIAGNOSIS — Z79899 Other long term (current) drug therapy: Secondary | ICD-10-CM | POA: Insufficient documentation

## 2016-04-27 DIAGNOSIS — Z992 Dependence on renal dialysis: Secondary | ICD-10-CM | POA: Diagnosis not present

## 2016-04-27 DIAGNOSIS — N186 End stage renal disease: Secondary | ICD-10-CM | POA: Diagnosis not present

## 2016-04-27 DIAGNOSIS — D631 Anemia in chronic kidney disease: Secondary | ICD-10-CM | POA: Diagnosis not present

## 2016-04-27 DIAGNOSIS — I10 Essential (primary) hypertension: Secondary | ICD-10-CM | POA: Diagnosis not present

## 2016-04-27 DIAGNOSIS — L259 Unspecified contact dermatitis, unspecified cause: Secondary | ICD-10-CM | POA: Diagnosis not present

## 2016-04-27 DIAGNOSIS — I120 Hypertensive chronic kidney disease with stage 5 chronic kidney disease or end stage renal disease: Secondary | ICD-10-CM | POA: Diagnosis not present

## 2016-04-27 DIAGNOSIS — J45909 Unspecified asthma, uncomplicated: Secondary | ICD-10-CM | POA: Diagnosis not present

## 2016-04-27 DIAGNOSIS — N2581 Secondary hyperparathyroidism of renal origin: Secondary | ICD-10-CM | POA: Diagnosis not present

## 2016-04-27 DIAGNOSIS — R0602 Shortness of breath: Secondary | ICD-10-CM | POA: Diagnosis not present

## 2016-04-27 DIAGNOSIS — R21 Rash and other nonspecific skin eruption: Secondary | ICD-10-CM | POA: Diagnosis present

## 2016-04-27 LAB — CBC WITH DIFFERENTIAL/PLATELET
Basophils Absolute: 0 10*3/uL (ref 0.0–0.1)
Basophils Relative: 1 %
Eosinophils Absolute: 0.4 10*3/uL (ref 0.0–0.7)
Eosinophils Relative: 10 %
HCT: 35.2 % — ABNORMAL LOW (ref 39.0–52.0)
Hemoglobin: 10.9 g/dL — ABNORMAL LOW (ref 13.0–17.0)
Lymphocytes Relative: 13 %
Lymphs Abs: 0.5 10*3/uL — ABNORMAL LOW (ref 0.7–4.0)
MCH: 29.1 pg (ref 26.0–34.0)
MCHC: 31 g/dL (ref 30.0–36.0)
MCV: 93.9 fL (ref 78.0–100.0)
Monocytes Absolute: 0.4 10*3/uL (ref 0.1–1.0)
Monocytes Relative: 10 %
Neutro Abs: 2.9 10*3/uL (ref 1.7–7.7)
Neutrophils Relative %: 68 %
Platelets: 141 10*3/uL — ABNORMAL LOW (ref 150–400)
RBC: 3.75 MIL/uL — ABNORMAL LOW (ref 4.22–5.81)
RDW: 16.8 % — ABNORMAL HIGH (ref 11.5–15.5)
WBC: 4.2 10*3/uL (ref 4.0–10.5)

## 2016-04-27 LAB — COMPREHENSIVE METABOLIC PANEL
ALT: 11 U/L — ABNORMAL LOW (ref 17–63)
AST: 28 U/L (ref 15–41)
Albumin: 3.1 g/dL — ABNORMAL LOW (ref 3.5–5.0)
Alkaline Phosphatase: 57 U/L (ref 38–126)
Anion gap: 11 (ref 5–15)
BUN: 17 mg/dL (ref 6–20)
CO2: 28 mmol/L (ref 22–32)
Calcium: 8.9 mg/dL (ref 8.9–10.3)
Chloride: 95 mmol/L — ABNORMAL LOW (ref 101–111)
Creatinine, Ser: 6.24 mg/dL — ABNORMAL HIGH (ref 0.61–1.24)
GFR calc Af Amer: 12 mL/min — ABNORMAL LOW (ref >60)
GFR calc non Af Amer: 11 mL/min — ABNORMAL LOW (ref >60)
Glucose, Bld: 134 mg/dL — ABNORMAL HIGH (ref 65–99)
Potassium: 4.3 mmol/L (ref 3.5–5.1)
Sodium: 134 mmol/L — ABNORMAL LOW (ref 135–145)
Total Bilirubin: 0.9 mg/dL (ref 0.3–1.2)
Total Protein: 8.2 g/dL — ABNORMAL HIGH (ref 6.5–8.1)

## 2016-04-27 LAB — I-STAT CG4 LACTIC ACID, ED: Lactic Acid, Venous: 0.98 mmol/L (ref 0.5–1.9)

## 2016-04-27 MED ORDER — LEVETIRACETAM 250 MG PO TABS
250.0000 mg | ORAL_TABLET | Freq: Once | ORAL | Status: AC
  Administered 2016-04-27: 250 mg via ORAL
  Filled 2016-04-27: qty 1

## 2016-04-27 MED ORDER — TRIAMCINOLONE ACETONIDE 0.025 % EX OINT: 1.0000 "application " | TOPICAL_OINTMENT | Freq: Two times a day (BID) | CUTANEOUS | 0 refills | Status: DC

## 2016-04-27 NOTE — ED Notes (Signed)
This RN called the lab to verify what color tube is needed for the levetiracetam and the lab stated it was a gold tube. One gold tube was drawn and sent down to the lab. Lab now called the provider and stated that the blood level is a red tube not a gold tube and a red tube was not drawn on the pt.

## 2016-04-27 NOTE — ED Provider Notes (Signed)
[facility] DEPT Provider Note   CSN: [id number] Arrival date & time: 04/27/16  1552    History   Chief Complaint Chief Complaint  Patient presents with  . Rash  . Shortness of Breath    HPI Jim Wyatt is a 36 y.o. male.  The patient is a 36 y.o. male with a h/o of PMH recent ileocecectomy for intussusception, HIV/AIDS (CD4 20 on 03/15/16), FSGS with ESRD on M/W/F HD, seizure disorder and HTN. He presents to the emergency department for multiple complaints. Patient reports being most concerned about feeling as though he would have a seizure today. He states that she has a sensation of being dazed prior to onset of seizure activity. He states that he felt this while he was at dialysis. He states that he started to develop some blurry vision which is also characteristic of symptoms prior to a seizure. He took a 500 mg tablet of Keppra and fell asleep. He reports that the symptoms had resolved upon waking. He states that he is feeling much better now since taking this medication. He denies missing any doses of his Keppra. He further denies having any witnessed seizure activity while at dialysis today. No associated fevers. No new or worsening numbness or paresthesias. No complaints of vomiting or extremity weakness.  Patient also concerned about a rash which he has had for approximately one week. He has been taking Benadryl and using cortisone cream and calamine lotion with no relief. He reports that the rash is itchy. He denies any new soaps, lotions, or detergents. No new food ingestions. He reports being started on labetalol recently, but he discontinued taking this 2 days ago with no change in his rash. He denies a history of similar symptoms. No exposure to persons with similar rash. No recent antibiotic use.     Past Medical History:  Diagnosis Date  . Asthma   . CAP (community acquired pneumonia) 10/2013  . Cellulitis 11/2013   LLE  . DVT (deep venous thrombosis) ([place]) 10/2013    BLE  . ESRD on hemodialysis ([place]) 01/2015   biopsy proven FSGS with ATN on renal bx from Sept 2015. Went on HD Dec 2016.   [name] Fever   . Headache   . HIV infection ([place]) dx'd 10/2013  . Hypertension   . Leg pain   . Multiple environmental allergies    "trees, dogs, cats"  . Multiple food allergies   . Shingles < 2010  . 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 OSA (obstructive sleep apnea) 04/19/2016  . Hospital discharge follow-up 04/19/2016  . Depression 03/24/2016  . Breast lump or mass   . Rectal bleeding   . GI bleed 03/15/2016  . Hyperkalemia   . New onset seizure ([place]) 03/13/2016  . Symptomatic anemia 02/01/2016  . Dysuria 01/18/2016  . Hematochezia 01/09/2016  . Postoperative anemia due to acute blood loss 01/09/2016  . Seizure disorder ([place]) 12/29/2015  . Intussusception ([place]) 12/28/2015  . Hypertension 12/28/2015  . Otitis externa of both ears 11/21/2015  . AIDS (acquired immunodeficiency syndrome), CD4 <=200 ([place])   . Meningitis   . Cephalalgia   . Blurry vision, bilateral   . Headache   . Hypertensive urgency   . New onset of headache in immunocompromised patient ([place]) 07/06/2015  . ESRD on dialysis ([place]) 04/27/2015  . CAP (community acquired pneumonia) 02/25/2015  . ESRD (end stage renal disease) ([place]) 02/16/2015  . History  of DVT (deep vein thrombosis) 01/12/2015  . Candidal esophagitis ([place])   . Constipation   . Nausea without vomiting 12/20/2014  . Abscess of right thigh 12/19/2014  . Chronic kidney disease (CKD), stage IV (severe) ([place]) 12/19/2014  . 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 Anemia due to end stage renal disease ([place]) 12/19/2014  . Hyponatremia 12/19/2014  . Community acquired pneumonia 07/30/2014  . Anemia of chronic disease 07/30/2014  . Acute on chronic renal failure ([place])   . 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 Human immunodeficiency virus (HIV) disease ([place])  12/11/2013  . CMV (cytomegalovirus infection) ([place]) 12/07/2013  . Protein-calorie malnutrition, severe ([place]) 12/06/2013  . 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 DVT (deep venous thrombosis) ([place]) 11/24/2013  . Asthma, chronic 11/24/2013  . Acquired immune deficiency syndrome ([place]) 11/14/2013  . AKI (acute kidney injury) ([place]) 11/11/2013  . Elevated liver enzymes 11/09/2013  . History of shingles 11/09/2013  . Obesity 11/09/2013  . Abdominal pain 11/07/2013  . Asthma     Past Surgical History:  Procedure Laterality Date  . AV FISTULA PLACEMENT Left 03/03/2015   Procedure: Creation of Left Arm RADIOCEPHALIC ARTERIOVENOUS FISTULA ;  Surgeon: Mal Misty, MD;  Location: [facility];  Service: Vascular;  Laterality: Left;  . COLONOSCOPY N/A 03/18/2016   Procedure: COLONOSCOPY;  Surgeon: Manus Gunning, MD;  Location: [facility];  Service: Gastroenterology;  Laterality: N/A;  . FEMUR FRACTURE SURGERY Right 09/2001  . FRACTURE SURGERY    . I&D EXTREMITY Right 12/18/2014   Procedure: IRRIGATION AND DEBRIDEMENT RIGHT LOWER EXTREMITY;  Surgeon: Ralene Ok, MD;  Location: [facility];  Service: General;  Laterality: Right;  . INSERTION OF DIALYSIS CATHETER  2016  . LAPAROTOMY N/A 12/28/2015   Procedure: EXPLORATORY LAPAROTOMY BOWEL RESECTION ILEOCECECTOMY;  Surgeon: Clovis Riley, MD;  Location: [facility];  Service: General;  Laterality: N/A;       Home Medications    Prior to Admission medications   Medication Sig Start Date End Date Taking? Authorizing Provider  abacavir (ZIAGEN) 300 MG tablet Take 2 tablets (600 mg total) by mouth daily with supper. 12/14/15  Yes Carlyle Basques, MD  acetaminophen (TYLENOL) 500 MG tablet Take 1,000 mg by mouth every 6 (six) hours as needed (pain).   Yes Historical Provider, MD  albuterol (PROVENTIL HFA;VENTOLIN HFA) 108 (90 Base) MCG/ACT inhaler Inhale 2 puffs into the lungs every 6 (six) hours as  needed for wheezing or shortness of breath. 04/19/16  Yes [name], MD  Ascorbic Acid (VITAMIN C) 250 MG CHEW Chew 250 mg by mouth daily.   Yes Historical Provider, MD  CARTIA XT 300 MG 24 hr capsule TK ONE C PO ONCE A DAY 02/17/16  Yes Historical Provider, MD  Cyanocobalamin 2500 MCG CHEW Chew 5,000 mcg by mouth daily.   Yes Historical Provider, MD  dapsone 100 MG tablet Take 1 tablet (100 mg total) by mouth daily. 11/24/15  Yes Alexa Angela Burke, MD  darunavir-cobicistat (PREZCOBIX) 800-150 MG tablet Take 1 tablet by mouth every evening. Swallow whole. Do NOT crush, break or chew tablets. Take with food. 12/14/15  Yes Carlyle Basques, MD  dolutegravir (TIVICAY) 50 MG tablet Take 1 tablet (50 mg total) by mouth every evening. 12/14/15  Yes Carlyle Basques, MD  hydrALAZINE (APRESOLINE) 100 MG tablet Take 0.5 tablets (50 mg total) by mouth 3 (three) times daily. 6:30am, 2:30pm, 11pm - for high blood pressure 03/21/16  Yes Burgess Estelle, MD  labetalol (NORMODYNE) 200 MG tablet  04/19/16  Yes Historical Provider, MD  levETIRAcetam (KEPPRA) 250 MG tablet Take 1 tablet (250 mg) by mouth on Tuesday, Thursday, Saturday at 7pm Patient taking differently: Take 250 mg by mouth See admin instructions. Take 1 tablet (250 mg) by mouth on Tuesday, Thursday, Saturday at 7pm 03/13/16  Yes Cameron Sprang, MD  levETIRAcetam (KEPPRA) 500 MG tablet Take 1 tablet (500 mg total) by mouth daily. 03/21/16  Yes Burgess Estelle, MD  losartan (COZAAR) 100 MG tablet Take 100 mg by mouth at bedtime.   Yes Historical Provider, MD  sucroferric oxyhydroxide (VELPHORO) 500 MG chewable tablet Chew 500 mg by mouth See admin instructions. Chew 1 tablet (500 mg) by mouth after every meal (2-3 times daily)   Yes Historical Provider, MD  ondansetron (ZOFRAN ODT) 8 MG disintegrating tablet Take 1 tablet (8 mg total) by mouth every 8 (eight) hours as needed for nausea or vomiting. Patient not taking: Reported on 04/27/2016 04/03/16   Carlyle Basques, MD  triamcinolone (KENALOG) 0.025 % ointment Apply 1 application topically 2 (two) times daily. Do not apply to face 04/27/16   Antonietta Breach, PA-C  venlafaxine XR (EFFEXOR-XR) 150 MG 24 hr capsule TAKE 1 CAPSULE(150 MG) BY MOUTH DAILY Patient not taking: Reported on 04/27/2016 04/19/16   Holley Raring, MD    Family History Family History  Problem Relation Age of Onset  . Hypertension Mother   . Diabetes Maternal Grandmother   . Kidney disease Maternal Grandmother     Social History Social History  Substance Use Topics  . Smoking status: Never Smoker  . Smokeless tobacco: Never Used  . Alcohol use No     Comment: I stopped drinking along time ago "     Allergies   Bactrim [sulfamethoxazole-trimethoprim]; Vancomycin; Reglan [metoclopramide]; Amlodipine; and Augmentin [amoxicillin-pot clavulanate]   Review of Systems Review of Systems Ten systems reviewed and are negative for acute change, except as noted in the HPI.    Physical Exam Updated Vital Signs BP (!) 166/112   Pulse 97   Temp 98.8 F (37.1 C) (Oral)   Resp 11   SpO2 92%   Physical Exam  Constitutional: He is oriented to person, place, and time. He appears well-developed. No distress.  Chronically ill appearing. Seems fatigued, but in NAD.  HENT:  Head: Normocephalic and atraumatic.  Mouth/Throat: Oropharynx is clear and moist.  Eyes: Conjunctivae and EOM are normal. No scleral icterus.  Neck: Normal range of motion.  No meningismus  Cardiovascular: Regular rhythm and intact distal pulses.   Borderline tachycardia.  Pulmonary/Chest: Effort normal. No respiratory distress. He has no wheezes. He has no rales.  Respirations even and unlabored. Lungs CTAB. No firm nodular masses to the left breast. No fluctuance, erythema, or heat to touch. No nipple discharge or inversion on the left.  Abdominal: Soft. He exhibits no distension. There is no tenderness. There is no guarding.  Soft, nontender abdomen.  Surgical midline incision from prior ileocecectomy.   Musculoskeletal: Normal range of motion.  Neurological: He is alert and oriented to person, place, and time. No cranial nerve deficit. He exhibits normal muscle tone. Coordination normal.  GCS 15. Speech is goal oriented. Patient moving all extremities.  Skin: Skin is warm and dry. He is not diaphoretic. No erythema. No pallor.  Slightly raised, punctate, papular, pruritic rash to dorsum of b/l hands. No skin peeling or sloughing.  Psychiatric: He has a normal mood and affect. His behavior is  normal.  Nursing note and vitals reviewed.    ED Treatments / Results  Labs (all labs ordered are listed, but only abnormal results are displayed) Labs Reviewed  COMPREHENSIVE METABOLIC PANEL - Abnormal; Notable for the following:       Result Value   Sodium 134 (*)    Chloride 95 (*)    Glucose, Bld 134 (*)    Creatinine, Ser 6.24 (*)    Total Protein 8.2 (*)    Albumin 3.1 (*)    ALT 11 (*)    GFR calc non Af Amer 11 (*)    GFR calc Af Amer 12 (*)    All other components within normal limits  CBC WITH DIFFERENTIAL/PLATELET - Abnormal; Notable for the following:    RBC 3.75 (*)    Hemoglobin 10.9 (*)    HCT 35.2 (*)    RDW 16.8 (*)    Platelets 141 (*)    Lymphs Abs 0.5 (*)    All other components within normal limits  LEVETIRACETAM LEVEL  I-STAT CG4 LACTIC ACID, ED  I-STAT CG4 LACTIC ACID, ED    EKG  EKG Interpretation  Date/Time:  Thursday April 27 2016 15:58:45 EST Ventricular Rate:  113 PR Interval:  154 QRS Duration: 92 QT Interval:  340 QTC Calculation: 466 R Axis:   20 Text Interpretation:  Sinus tachycardia Nonspecific T wave abnormality Abnormal ECG No significant change since last tracing Confirmed by [facility] MD, Corene Cornea [phone]) on 04/27/2016 8:42:21 PM       Radiology Dg Chest 2 View  Result Date: 04/27/2016 CLINICAL DATA:  Shortness of breath. EXAM: CHEST  2 VIEW COMPARISON:  Radiograph March 17, 2016.  FINDINGS: Stable cardiomediastinal silhouette. No pneumothorax or pleural effusion is noted. Both lungs are clear. The visualized skeletal structures are unremarkable. IMPRESSION: No active cardiopulmonary disease. Electronically Signed   By: Marijo Conception, M.D.   On: 04/27/2016 16:25    Procedures Procedures (including critical care time)  Medications Ordered in ED Medications  levETIRAcetam (KEPPRA) tablet 250 mg (250 mg Oral Given 04/27/16 2214)     Initial Impression / Assessment and Plan / ED Course  I have reviewed the triage vital signs and the nursing notes.  Pertinent labs & imaging results that were available during my care of the patient were reviewed by me and considered in my medical decision making (see chart for details).     36 year old male presents to the emergency department for multiple complaints. He states that he was most concerned about sensations of being "dazed" with blurry vision. He states that he has had similar symptoms in the past prior to onset of seizure-like activity. He reports being compliant with his antiseizure medications. Patient states that his symptoms resolved after taking a dose of Keppra and a nap.  Patient has no focal neurologic deficits on exam today. He is afebrile. No recent infectious symptoms or apparent external factors which could lower his seizure threshold. Only contributing factor may be medication noncompliance as prior notes suggest this. Keppra level ordered; collected by RN. Remainder of laboratory work up is at baseline.  Patient with secondary complaints of rash to his b/l hands. This appears most c/w contact dermatitis. Findings not c/w SJS, erythema multiforme major, or erythema multiforme minor. He also complains of some shortness of breath, but states that this is chronic for him. He has no hypoxia today. Chest x-ray is clear.  The patient is chronically ill, I see no indication for further emergent  workup or inpatient  management. I have encouraged him to follow-up with his neurologist and his primary care doctor regarding his visit today. Patient instructed to continue taking his daily doses of medication. Return precautions discussed and provided. Patient discharged in stable condition with no unaddressed concerns.   Final Clinical Impressions(s) / ED Diagnoses   Final diagnoses:  Contact dermatitis, unspecified contact dermatitis type, unspecified trigger  Hypertension not at goal    New Prescriptions Discharge Medication List as of 04/27/2016  9:53 PM    START taking these medications   Details  triamcinolone (KENALOG) 0.025 % ointment Apply 1 application topically 2 (two) times daily. Do not apply to face, Starting Thu 04/27/2016, Print         Antonietta Breach, PA-C 04/27/16 2259    Merrily Pew, MD 04/27/16 [phone]

## 2016-04-27 NOTE — ED Triage Notes (Signed)
Pt reports he is T/Th/Sa dialysis patient. He has a rash on his right hand and has had increased shortness of breath, especially during treatment today. Pt also states he has hx of seizures and felt like he might have a seizure today at HD but did not.

## 2016-04-27 NOTE — Discharge Instructions (Signed)
Continue taking your daily medications as prescribed. Follow-up with your neurologist regarding your pending Keppra level. You may use triamcinolone as prescribed for your rash. This can be followed up on by your primary care doctor. You may return to the ED as needed for new or worsening symptoms.

## 2016-04-27 NOTE — ED Notes (Addendum)
Pt sts the only new thing in his routine is labetalol which he stopped taking on Tuesday but no change. No new soaps, detergents or foods. Pt taking benadryl, cortisone cream and calamine lotion with no relief.

## 2016-04-29 DIAGNOSIS — N2581 Secondary hyperparathyroidism of renal origin: Secondary | ICD-10-CM | POA: Diagnosis not present

## 2016-04-29 DIAGNOSIS — N186 End stage renal disease: Secondary | ICD-10-CM | POA: Diagnosis not present

## 2016-04-29 DIAGNOSIS — D631 Anemia in chronic kidney disease: Secondary | ICD-10-CM | POA: Diagnosis not present

## 2016-04-30 LAB — LEVETIRACETAM LEVEL: Levetiracetam Lvl: 22.5 ug/mL (ref 10.0–40.0)

## 2016-05-01 ENCOUNTER — Ambulatory Visit: Payer: Medicare Other

## 2016-05-02 ENCOUNTER — Emergency Department (HOSPITAL_COMMUNITY)
Admission: EM | Admit: 2016-05-02 | Discharge: 2016-05-03 | Disposition: A | Discharge status: Home / Self Care | Payer: Medicare Other | Attending: Emergency Medicine | Admitting: Emergency Medicine

## 2016-05-02 ENCOUNTER — Encounter (HOSPITAL_COMMUNITY): Payer: Self-pay | Admitting: Emergency Medicine

## 2016-05-02 ENCOUNTER — Emergency Department (HOSPITAL_COMMUNITY): Payer: Medicare Other

## 2016-05-02 DIAGNOSIS — J45909 Unspecified asthma, uncomplicated: Secondary | ICD-10-CM | POA: Diagnosis not present

## 2016-05-02 DIAGNOSIS — Z79899 Other long term (current) drug therapy: Secondary | ICD-10-CM | POA: Diagnosis not present

## 2016-05-02 DIAGNOSIS — N186 End stage renal disease: Secondary | ICD-10-CM | POA: Diagnosis not present

## 2016-05-02 DIAGNOSIS — I120 Hypertensive chronic kidney disease with stage 5 chronic kidney disease or end stage renal disease: Secondary | ICD-10-CM | POA: Insufficient documentation

## 2016-05-02 DIAGNOSIS — M79605 Pain in left leg: Secondary | ICD-10-CM | POA: Insufficient documentation

## 2016-05-02 DIAGNOSIS — M79606 Pain in leg, unspecified: Secondary | ICD-10-CM

## 2016-05-02 DIAGNOSIS — Z992 Dependence on renal dialysis: Secondary | ICD-10-CM | POA: Diagnosis not present

## 2016-05-02 DIAGNOSIS — N2581 Secondary hyperparathyroidism of renal origin: Secondary | ICD-10-CM | POA: Diagnosis not present

## 2016-05-02 DIAGNOSIS — D631 Anemia in chronic kidney disease: Secondary | ICD-10-CM | POA: Diagnosis not present

## 2016-05-02 MED ORDER — HYDROCODONE-ACETAMINOPHEN 5-325 MG PO TABS
1.0000 | ORAL_TABLET | Freq: Once | ORAL | Status: AC
Start: 2016-05-02 — End: 2016-05-02
  Administered 2016-05-02: 1 via ORAL
  Filled 2016-05-02: qty 1

## 2016-05-02 MED ORDER — ENOXAPARIN SODIUM 80 MG/0.8ML ~~LOC~~ SOLN
1.0000 mg/kg | Freq: Once | SUBCUTANEOUS | Status: AC
  Administered 2016-05-02: 65 mg via SUBCUTANEOUS
  Filled 2016-05-02: qty 0.8

## 2016-05-02 NOTE — ED Provider Notes (Signed)
[facility] DEPT Provider Note   CSN: [id number] Arrival date & time: 05/02/16  2014     History   Chief Complaint Chief Complaint  Patient presents with  . Leg Pain    left    HPI Jim Wyatt is a 36 y.o. male presents to the ED with left thigh pain. Pt states pain began 28 hours ago, has been progressively worsening and is now a 7/10 constant sharp pain, worsened by walking and alleviated somewhat by standing. Pain localized to anterior thigh from groin to approximately 2 inches above the knee. Denies hx of trauma/falls, recent travel or long periods of being stationary. Hx of DVT September 2015, for which the pt was anticoagulated with warfarin and coumadin, but this was discontinued. Endorses SOB for 3 months, but this is unchanged, as well as no change in abdominal pain (remote history of ileocecectomy and intussusception).     Denies HA, dizziness, CP, n/v/d, dysuria, hematuria, hematochezia.   The history is provided by the patient.    Past Medical History:  Diagnosis Date  . Asthma   . CAP (community acquired pneumonia) 10/2013  . Cellulitis 11/2013   LLE  . DVT (deep venous thrombosis) ([place]) 10/2013   BLE  . 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 Went on HD Dec 2016.   [name] Fever   . Headache   . HIV infection ([place]) dx'd 10/2013  . Hypertension   . Leg pain   . Multiple environmental allergies    "trees, dogs, cats"  . Multiple food allergies   . Shingles < 2010  . 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 OSA (obstructive sleep apnea) 04/19/2016  . Hospital discharge follow-up 04/19/2016  . Depression 03/24/2016  . Breast lump or mass   . Rectal bleeding   . GI bleed 03/15/2016  . Hyperkalemia   . New onset seizure ([place]) 03/13/2016  . Symptomatic anemia 02/01/2016  . Dysuria 01/18/2016  . Hematochezia 01/09/2016  . Postoperative anemia due  to acute blood loss 01/09/2016  . Seizure disorder ([place]) 12/29/2015  . Intussusception ([place]) 12/28/2015  . Hypertension 12/28/2015  . Otitis externa of both ears 11/21/2015  . AIDS (acquired immunodeficiency syndrome), CD4 <=200 ([place])   . Meningitis   . Cephalalgia   . Blurry vision, bilateral   . Headache   . Hypertensive urgency   . New onset of headache in immunocompromised patient ([place]) 07/06/2015  . ESRD on dialysis ([place]) 04/27/2015  . CAP (community acquired pneumonia) 02/25/2015  . ESRD (end stage renal disease) ([place]) 02/16/2015  . History of DVT (deep vein thrombosis) 01/12/2015  . Candidal esophagitis ([place])   . Constipation   . Nausea without vomiting 12/20/2014  . Abscess of right thigh 12/19/2014  . Chronic kidney disease (CKD), stage IV (severe) ([place]) 12/19/2014  . 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 Anemia due to end stage renal disease ([place]) 12/19/2014  . Hyponatremia 12/19/2014  . Community acquired pneumonia 07/30/2014  . Anemia of chronic disease 07/30/2014  . Acute on chronic renal failure ([place])   . 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 Human immunodeficiency virus (HIV) disease ([place]) 12/11/2013  . CMV (cytomegalovirus infection) ([place]) 12/07/2013  . Protein-calorie malnutrition, severe ([place]) 12/06/2013  . 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 DVT (deep venous thrombosis) ([place]) 11/24/2013  . Asthma, chronic 11/24/2013  .  Acquired immune deficiency syndrome ([place]) 11/14/2013  . AKI (acute kidney injury) ([place]) 11/11/2013  . Elevated liver enzymes 11/09/2013  . History of shingles 11/09/2013  . Obesity 11/09/2013  . Abdominal pain 11/07/2013  . Asthma     Past Surgical History:  Procedure Laterality Date  . AV FISTULA PLACEMENT Left 03/03/2015   Procedure: Creation of Left Arm RADIOCEPHALIC ARTERIOVENOUS FISTULA ;  Surgeon: Mal Misty, MD;  Location: [facility];  Service: Vascular;  Laterality: Left;    . COLONOSCOPY N/A 03/18/2016   Procedure: COLONOSCOPY;  Surgeon: Manus Gunning, MD;  Location: [facility];  Service: Gastroenterology;  Laterality: N/A;  . FEMUR FRACTURE SURGERY Right 09/2001  . FRACTURE SURGERY    . I&D EXTREMITY Right 12/18/2014   Procedure: IRRIGATION AND DEBRIDEMENT RIGHT LOWER EXTREMITY;  Surgeon: Ralene Ok, MD;  Location: [facility];  Service: General;  Laterality: Right;  . INSERTION OF DIALYSIS CATHETER  2016  . LAPAROTOMY N/A 12/28/2015   Procedure: EXPLORATORY LAPAROTOMY BOWEL RESECTION ILEOCECECTOMY;  Surgeon: Clovis Riley, MD;  Location: [facility];  Service: General;  Laterality: N/A;       Home Medications    Prior to Admission medications   Medication Sig Start Date End Date Taking? Authorizing Provider  abacavir (ZIAGEN) 300 MG tablet Take 2 tablets (600 mg total) by mouth daily with supper. 12/14/15   Carlyle Basques, MD  acetaminophen (TYLENOL) 500 MG tablet Take 1,000 mg by mouth every 6 (six) hours as needed (pain).    Historical Provider, MD  albuterol (PROVENTIL HFA;VENTOLIN HFA) 108 (90 Base) MCG/ACT inhaler Inhale 2 puffs into the lungs every 6 (six) hours as needed for wheezing or shortness of breath. 04/19/16   [name], MD  Ascorbic Acid (VITAMIN C) 250 MG CHEW Chew 250 mg by mouth daily.    Historical Provider, MD  CARTIA XT 300 MG 24 hr capsule TK ONE C PO ONCE A DAY 02/17/16   Historical Provider, MD  Cyanocobalamin 2500 MCG CHEW Chew 5,000 mcg by mouth daily.    Historical Provider, MD  dapsone 100 MG tablet Take 1 tablet (100 mg total) by mouth daily. 11/24/15   Alexa Angela Burke, MD  darunavir-cobicistat (PREZCOBIX) 800-150 MG tablet Take 1 tablet by mouth every evening. Swallow whole. Do NOT crush, break or chew tablets. Take with food. 12/14/15   Carlyle Basques, MD  dolutegravir (TIVICAY) 50 MG tablet Take 1 tablet (50 mg total) by mouth every evening. 12/14/15   Carlyle Basques, MD  hydrALAZINE (APRESOLINE) 100 MG tablet  Take 0.5 tablets (50 mg total) by mouth 3 (three) times daily. 6:30am, 2:30pm, 11pm - for high blood pressure 03/21/16   Burgess Estelle, MD  labetalol (NORMODYNE) 200 MG tablet  04/19/16   Historical Provider, MD  levETIRAcetam (KEPPRA) 250 MG tablet Take 1 tablet (250 mg) by mouth on Tuesday, Thursday, Saturday at 7pm Patient taking differently: Take 250 mg by mouth See admin instructions. Take 1 tablet (250 mg) by mouth on Tuesday, Thursday, Saturday at 7pm 03/13/16   Cameron Sprang, MD  levETIRAcetam (KEPPRA) 500 MG tablet Take 1 tablet (500 mg total) by mouth daily. 03/21/16   Burgess Estelle, MD  losartan (COZAAR) 100 MG tablet Take 100 mg by mouth at bedtime.    Historical Provider, MD  ondansetron (ZOFRAN ODT) 8 MG disintegrating tablet Take 1 tablet (8 mg total) by mouth every 8 (eight) hours as needed for nausea or vomiting. Patient not taking: Reported on 04/27/2016 04/03/16  Carlyle Basques, MD  sucroferric oxyhydroxide [facility]) 500 MG chewable tablet Chew 500 mg by mouth See admin instructions. Chew 1 tablet (500 mg) by mouth after every meal (2-3 times daily)    Historical Provider, MD  triamcinolone (KENALOG) 0.025 % ointment Apply 1 application topically 2 (two) times daily. Do not apply to face 04/27/16   Antonietta Breach, PA-C  venlafaxine XR (EFFEXOR-XR) 150 MG 24 hr capsule TAKE 1 CAPSULE(150 MG) BY MOUTH DAILY Patient not taking: Reported on 04/27/2016 04/19/16   Holley Raring, MD    Family History Family History  Problem Relation Age of Onset  . Hypertension Mother   . Diabetes Maternal Grandmother   . Kidney disease Maternal Grandmother     Social History Social History  Substance Use Topics  . Smoking status: Never Smoker  . Smokeless tobacco: Never Used  . Alcohol use No     Comment: I stopped drinking along time ago "     Allergies   Bactrim [sulfamethoxazole-trimethoprim]; Vancomycin; Reglan [metoclopramide]; Amlodipine; and Augmentin [amoxicillin-pot clavulanate]   Review  of Systems Review of Systems  Constitutional: Negative for chills and fever.  Respiratory: Positive for shortness of breath.   Cardiovascular: Negative for chest pain and leg swelling.  Gastrointestinal: Positive for abdominal pain. Negative for blood in stool, diarrhea, nausea and vomiting.  Musculoskeletal: Negative for joint swelling.  Skin: Negative for color change and wound.  Allergic/Immunologic: Positive for environmental allergies and immunocompromised state.  Neurological: Negative for dizziness, syncope, weakness and headaches.  Psychiatric/Behavioral: Negative for confusion.     Physical Exam Updated Vital Signs BP (!) 171/106   Pulse 96   SpO2 97%   Physical Exam  Constitutional: He is oriented to person, place, and time.  Chronically ill-appearing  HENT:  Head: Normocephalic and atraumatic.  Eyes: Conjunctivae are normal. Right eye exhibits no discharge. Left eye exhibits no discharge. No scleral icterus.  Neck: No JVD present. No tracheal deviation present.  Cardiovascular: Normal rate and regular rhythm.   Pulmonary/Chest: Effort normal and breath sounds normal. He has no wheezes. He has no rales.  Abdominal: He exhibits no distension.  Musculoskeletal: Normal range of motion. He exhibits tenderness. He exhibits no edema or deformity.  5/5 strength in LE, negative Homan's, peripheral pulses intact, extremity warm, mild TTP of anterior thigh  Neurological: He is alert and oriented to person, place, and time.  Skin: Skin is warm and dry. Capillary refill takes less than 2 seconds.  Psychiatric: He has a normal mood and affect. His behavior is normal. Judgment and thought content normal.     ED Treatments / Results  Labs (all labs ordered are listed, but only abnormal results are displayed) Labs Reviewed - No data to display  EKG  EKG Interpretation None       Radiology Dg Tibia/fibula Left  Result Date: 05/02/2016 CLINICAL DATA:  Left leg pain EXAM:  LEFT TIBIA AND FIBULA - 2 VIEW COMPARISON:  CT 12/06/2013 FINDINGS: There is no evidence of fracture or other focal bone lesions. Soft tissues are unremarkable. IMPRESSION: Negative. Electronically Signed   By: Donavan Foil M.D.   On: 05/02/2016 21:22   Dg Femur Min 2 Views Left  Result Date: 05/02/2016 CLINICAL DATA:  Leg pain EXAM: LEFT FEMUR 2 VIEWS COMPARISON:  None. FINDINGS: There is no evidence of fracture or other focal bone lesions. Soft tissues are unremarkable. IMPRESSION: Negative. Electronically Signed   By: Donavan Foil M.D.   On: 05/02/2016 21:23  Procedures Procedures (including critical care time)  Medications Ordered in ED Medications  enoxaparin (LOVENOX) injection 65 mg (65 mg Subcutaneous Given 05/02/16 2324)  HYDROcodone-acetaminophen (NORCO/VICODIN) 5-325 MG per tablet 1 tablet (1 tablet Oral Given 05/02/16 2324)     Initial Impression / Assessment and Plan / ED Course  I have reviewed the triage vital signs and the nursing notes.  Pertinent labs & imaging results that were available during my care of the patient were reviewed by me and considered in my medical decision making (see chart for details).     35yom presenting to ED with 24 hours left thigh pain. No hx trauma/falls, Xrays of femur and tib/fib negative for fracture or bone lesions. Coupled with lack of systemic symptoms, unlikely to be osteomyelitis or other infectious process. Given AIDS+ status and hx of previous DVT, will give one-time dose of lovenox. Ultrasound is unavailable at this time; pt will return tomorrow for doppler of LE for further workup of DVT. Pt will f/u with PCP within 1 week for re-evaluation; discussed indications to return to ED if symptoms continue or worsen.   Final Clinical Impressions(s) / ED Diagnoses   Final diagnoses:  Left leg pain    New Prescriptions Discharge Medication List as of 05/02/2016 11:56 PM       Renita Papa, [place] 05/03/16 0103    Isla Pence,  MD 05/03/16 2129

## 2016-05-02 NOTE — ED Triage Notes (Signed)
HD pt Tues, Thr, Saturday, las HD today, c/o 9/10 constant left leg pain since yesterday morning with no relief, pt states he has a hx of DVT.

## 2016-05-03 ENCOUNTER — Ambulatory Visit (HOSPITAL_BASED_OUTPATIENT_CLINIC_OR_DEPARTMENT_OTHER)
Admission: RE | Admit: 2016-05-03 | Discharge: 2016-05-03 | Disposition: A | Discharge status: Home / Self Care | Payer: Medicare Other | Source: Ambulatory Visit | Attending: Physician Assistant | Admitting: Physician Assistant

## 2016-05-03 DIAGNOSIS — M79605 Pain in left leg: Secondary | ICD-10-CM | POA: Diagnosis not present

## 2016-05-03 DIAGNOSIS — M79609 Pain in unspecified limb: Secondary | ICD-10-CM

## 2016-05-03 NOTE — Progress Notes (Signed)
Preliminary results by tech - Left Lower Ext. Venous Completed. Negative for acute deep vein thrombosis or superficial vein thrombosis in the left leg. The left popliteal vein demonstrated partial compression with residual thrombus on the posterior wall, which appeared chronic. All other veins are patent without evidence of thrombus.  Oda Cogan, BS, RDMS, RVT

## 2016-05-05 ENCOUNTER — Ambulatory Visit: Payer: Self-pay

## 2016-05-06 DIAGNOSIS — N186 End stage renal disease: Secondary | ICD-10-CM | POA: Diagnosis not present

## 2016-05-06 DIAGNOSIS — N2581 Secondary hyperparathyroidism of renal origin: Secondary | ICD-10-CM | POA: Diagnosis not present

## 2016-05-06 DIAGNOSIS — D631 Anemia in chronic kidney disease: Secondary | ICD-10-CM | POA: Diagnosis not present

## 2016-05-09 ENCOUNTER — Emergency Department (HOSPITAL_COMMUNITY): Payer: Medicare Other

## 2016-05-09 ENCOUNTER — Emergency Department (HOSPITAL_COMMUNITY)
Admission: EM | Admit: 2016-05-09 | Discharge: 2016-05-09 | Disposition: A | Discharge status: Home / Self Care | Payer: Medicare Other | Attending: Emergency Medicine | Admitting: Emergency Medicine

## 2016-05-09 DIAGNOSIS — R51 Headache: Secondary | ICD-10-CM | POA: Insufficient documentation

## 2016-05-09 DIAGNOSIS — Z992 Dependence on renal dialysis: Secondary | ICD-10-CM | POA: Insufficient documentation

## 2016-05-09 DIAGNOSIS — G253 Myoclonus: Secondary | ICD-10-CM | POA: Insufficient documentation

## 2016-05-09 DIAGNOSIS — N186 End stage renal disease: Secondary | ICD-10-CM | POA: Insufficient documentation

## 2016-05-09 DIAGNOSIS — R519 Headache, unspecified: Secondary | ICD-10-CM

## 2016-05-09 DIAGNOSIS — J45909 Unspecified asthma, uncomplicated: Secondary | ICD-10-CM | POA: Insufficient documentation

## 2016-05-09 DIAGNOSIS — D631 Anemia in chronic kidney disease: Secondary | ICD-10-CM | POA: Diagnosis not present

## 2016-05-09 DIAGNOSIS — N2581 Secondary hyperparathyroidism of renal origin: Secondary | ICD-10-CM | POA: Diagnosis not present

## 2016-05-09 DIAGNOSIS — Z79899 Other long term (current) drug therapy: Secondary | ICD-10-CM | POA: Insufficient documentation

## 2016-05-09 DIAGNOSIS — I150 Renovascular hypertension: Secondary | ICD-10-CM | POA: Insufficient documentation

## 2016-05-09 LAB — CBC WITH DIFFERENTIAL/PLATELET
Basophils Absolute: 0 10*3/uL (ref 0.0–0.1)
Basophils Relative: 1 %
Eosinophils Absolute: 0.6 10*3/uL (ref 0.0–0.7)
Eosinophils Relative: 14 %
HCT: 30.6 % — ABNORMAL LOW (ref 39.0–52.0)
Hemoglobin: 9.8 g/dL — ABNORMAL LOW (ref 13.0–17.0)
Lymphocytes Relative: 11 %
Lymphs Abs: 0.5 10*3/uL — ABNORMAL LOW (ref 0.7–4.0)
MCH: 28.3 pg (ref 26.0–34.0)
MCHC: 32 g/dL (ref 30.0–36.0)
MCV: 88.4 fL (ref 78.0–100.0)
Monocytes Absolute: 0.5 10*3/uL (ref 0.1–1.0)
Monocytes Relative: 12 %
Neutro Abs: 2.9 10*3/uL (ref 1.7–7.7)
Neutrophils Relative %: 63 %
Platelets: 144 10*3/uL — ABNORMAL LOW (ref 150–400)
RBC: 3.46 MIL/uL — ABNORMAL LOW (ref 4.22–5.81)
RDW: 17.4 % — ABNORMAL HIGH (ref 11.5–15.5)
WBC: 4.6 10*3/uL (ref 4.0–10.5)

## 2016-05-09 LAB — COMPREHENSIVE METABOLIC PANEL
ALT: 34 U/L (ref 17–63)
AST: 44 U/L — ABNORMAL HIGH (ref 15–41)
Albumin: 2.9 g/dL — ABNORMAL LOW (ref 3.5–5.0)
Alkaline Phosphatase: 45 U/L (ref 38–126)
Anion gap: 12 (ref 5–15)
BUN: 43 mg/dL — ABNORMAL HIGH (ref 6–20)
CO2: 27 mmol/L (ref 22–32)
Calcium: 8.5 mg/dL — ABNORMAL LOW (ref 8.9–10.3)
Chloride: 95 mmol/L — ABNORMAL LOW (ref 101–111)
Creatinine, Ser: 9.77 mg/dL — ABNORMAL HIGH (ref 0.61–1.24)
GFR calc Af Amer: 7 mL/min — ABNORMAL LOW (ref >60)
GFR calc non Af Amer: 6 mL/min — ABNORMAL LOW (ref >60)
Glucose, Bld: 122 mg/dL — ABNORMAL HIGH (ref 65–99)
Potassium: 3.8 mmol/L (ref 3.5–5.1)
Sodium: 134 mmol/L — ABNORMAL LOW (ref 135–145)
Total Bilirubin: 0.8 mg/dL (ref 0.3–1.2)
Total Protein: 7.3 g/dL (ref 6.5–8.1)

## 2016-05-09 LAB — I-STAT CHEM 8, ED
BUN: 48 mg/dL — ABNORMAL HIGH (ref 6–20)
Calcium, Ion: 1.04 mmol/L — ABNORMAL LOW (ref 1.15–1.40)
Chloride: 94 mmol/L — ABNORMAL LOW (ref 101–111)
Creatinine, Ser: 10.7 mg/dL — ABNORMAL HIGH (ref 0.61–1.24)
Glucose, Bld: 124 mg/dL — ABNORMAL HIGH (ref 65–99)
HCT: 32 % — ABNORMAL LOW (ref 39.0–52.0)
Hemoglobin: 10.9 g/dL — ABNORMAL LOW (ref 13.0–17.0)
Potassium: 3.9 mmol/L (ref 3.5–5.1)
Sodium: 135 mmol/L (ref 135–145)
TCO2: 31 mmol/L (ref 0–100)

## 2016-05-09 MED ORDER — HYDRALAZINE HCL 25 MG PO TABS
50.0000 mg | ORAL_TABLET | Freq: Once | ORAL | Status: AC
  Administered 2016-05-09: 50 mg via ORAL
  Filled 2016-05-09: qty 2

## 2016-05-09 MED ORDER — PROCHLORPERAZINE EDISYLATE 5 MG/ML IJ SOLN
10.0000 mg | Freq: Once | INTRAMUSCULAR | Status: AC
  Administered 2016-05-09: 10 mg via INTRAVENOUS
  Filled 2016-05-09: qty 2

## 2016-05-09 MED ORDER — SODIUM CHLORIDE 0.9 % IV SOLN
1000.0000 mg | Freq: Once | INTRAVENOUS | Status: AC
  Administered 2016-05-09: 1000 mg via INTRAVENOUS
  Filled 2016-05-09: qty 10

## 2016-05-09 MED ORDER — MAGNESIUM SULFATE 2 GM/50ML IV SOLN
2.0000 g | Freq: Once | INTRAVENOUS | Status: AC
Start: 2016-05-09 — End: 2016-05-09
  Administered 2016-05-09: 2 g via INTRAVENOUS
  Filled 2016-05-09: qty 50

## 2016-05-09 MED ORDER — DIPHENHYDRAMINE HCL 50 MG/ML IJ SOLN
25.0000 mg | Freq: Once | INTRAMUSCULAR | Status: AC
  Administered 2016-05-09: 25 mg via INTRAVENOUS
  Filled 2016-05-09: qty 1

## 2016-05-09 MED ORDER — SODIUM CHLORIDE 0.9 % IV BOLUS (SEPSIS)
500.0000 mL | Freq: Once | INTRAVENOUS | Status: AC
  Administered 2016-05-09: 500 mL via INTRAVENOUS

## 2016-05-09 MED ORDER — OXYCODONE HCL 5 MG PO TABS
5.0000 mg | ORAL_TABLET | Freq: Once | ORAL | Status: AC
  Administered 2016-05-09: 5 mg via ORAL
  Filled 2016-05-09: qty 1

## 2016-05-09 NOTE — ED Notes (Signed)
Got patient into a gown and on the monitor 

## 2016-05-09 NOTE — Discharge Instructions (Signed)
Call your dialysis center and see when you can get this done.  Return for worsening symptoms, loss of consciousness.

## 2016-05-09 NOTE — ED Notes (Signed)
EMS placed 18G IV was in pt's right hand, NOT in left, as originally charted. IV became occluded and therefore was removed. Catheter intact upon removal.

## 2016-05-09 NOTE — ED Notes (Signed)
Patient transported to CT 

## 2016-05-09 NOTE — ED Triage Notes (Signed)
Patient transported via [facility]. Patient was about an hour into dialysis when he started feeling like he was going to have a seizure. Patient reports he normally has hypertension and starts having muscle twitches before having a seizure. Patient reports last dialysis was Thursday and that he hasn't slept in 2 days due to his work schedule. Patient reports taking his pre-dialysis dose of Kepra but not his post-dialysis dose. Patient's BP was 210/120 manually when EMS arrived.

## 2016-05-09 NOTE — ED Provider Notes (Signed)
[facility] DEPT Provider Note   CSN: [id number] Arrival date & time: 05/09/16  1247     History   Chief Complaint Chief Complaint  Patient presents with  . Hypertension    HPI Jim Wyatt is a 36 y.o. male.  36 yo M with a significant past medical history of AIDS last CD4 count of 20 comes in with a chief complaint of feeling like he is going to have a seizure. He states that his blood pressure usually ramps up and he has some shaking prior to have a fall body shaking event. He denies fevers denies vomiting denies neck pain or headache. Denies recent head injury. States he is compliant with his Keppra.   The history is provided by the patient.  Hypertension  Pertinent negatives include no chest pain, no abdominal pain, no headaches and no shortness of breath.  Seizures   This is a recurrent problem. The current episode started 3 to 5 hours ago. The problem has not changed since onset.Associated symptoms include sleepiness. Pertinent negatives include no confusion, no headaches, no visual disturbance, no chest pain, no vomiting and no diarrhea. Characteristics include eye blinking. The seizure(s) had no focality. Possible causes do not include medication or dosage change or recent illness. There has been no fever.    Past Medical History:  Diagnosis Date  . Asthma   . CAP (community acquired pneumonia) 10/2013  . Cellulitis 11/2013   LLE  . DVT (deep venous thrombosis) ([place]) 10/2013   BLE  . 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 Went on HD Dec 2016.   [name] Fever   . Headache   . HIV infection ([place]) dx'd 10/2013  . Hypertension   . Leg pain   . Multiple environmental allergies    "trees, dogs, cats"  . Multiple food allergies   . Shingles < 2010  . 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 OSA (obstructive sleep apnea) 04/19/2016  . Hospital discharge  follow-up 04/19/2016  . Depression 03/24/2016  . Breast lump or mass   . Rectal bleeding   . GI bleed 03/15/2016  . Hyperkalemia   . New onset seizure ([place]) 03/13/2016  . Symptomatic anemia 02/01/2016  . Dysuria 01/18/2016  . Hematochezia 01/09/2016  . Postoperative anemia due to acute blood loss 01/09/2016  . Seizure disorder ([place]) 12/29/2015  . Intussusception ([place]) 12/28/2015  . Hypertension 12/28/2015  . Otitis externa of both ears 11/21/2015  . AIDS (acquired immunodeficiency syndrome), CD4 <=200 ([place])   . Meningitis   . Cephalalgia   . Blurry vision, bilateral   . Headache   . Hypertensive urgency   . New onset of headache in immunocompromised patient ([place]) 07/06/2015  . ESRD on dialysis ([place]) 04/27/2015  . CAP (community acquired pneumonia) 02/25/2015  . ESRD (end stage renal disease) ([place]) 02/16/2015  . History of DVT (deep vein thrombosis) 01/12/2015  . Candidal esophagitis ([place])   . Constipation   . Nausea without vomiting 12/20/2014  . Abscess of right thigh 12/19/2014  . Chronic kidney disease (CKD), stage IV (severe) ([place]) 12/19/2014  . 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 Anemia due to end stage renal disease ([place]) 12/19/2014  . Hyponatremia 12/19/2014  . Community acquired pneumonia 07/30/2014  . Anemia of chronic disease 07/30/2014  . Acute on chronic renal failure ([place])   . 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  Human immunodeficiency virus (HIV) disease ([place]) 12/11/2013  . CMV (cytomegalovirus infection) ([place]) 12/07/2013  . Protein-calorie malnutrition, severe ([place]) 12/06/2013  . 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 DVT (deep venous thrombosis) ([place]) 11/24/2013  . Asthma, chronic 11/24/2013  . Acquired immune deficiency syndrome ([place]) 11/14/2013  . AKI (acute kidney injury) ([place]) 11/11/2013  . Elevated liver enzymes 11/09/2013  . History of shingles 11/09/2013  . Obesity 11/09/2013  .  Abdominal pain 11/07/2013  . Asthma     Past Surgical History:  Procedure Laterality Date  . AV FISTULA PLACEMENT Left 03/03/2015   Procedure: Creation of Left Arm RADIOCEPHALIC ARTERIOVENOUS FISTULA ;  Surgeon: Mal Misty, MD;  Location: [facility];  Service: Vascular;  Laterality: Left;  . COLONOSCOPY N/A 03/18/2016   Procedure: COLONOSCOPY;  Surgeon: Manus Gunning, MD;  Location: [facility];  Service: Gastroenterology;  Laterality: N/A;  . FEMUR FRACTURE SURGERY Right 09/2001  . FRACTURE SURGERY    . I&D EXTREMITY Right 12/18/2014   Procedure: IRRIGATION AND DEBRIDEMENT RIGHT LOWER EXTREMITY;  Surgeon: Ralene Ok, MD;  Location: [facility];  Service: General;  Laterality: Right;  . INSERTION OF DIALYSIS CATHETER  2016  . LAPAROTOMY N/A 12/28/2015   Procedure: EXPLORATORY LAPAROTOMY BOWEL RESECTION ILEOCECECTOMY;  Surgeon: Clovis Riley, MD;  Location: [facility];  Service: General;  Laterality: N/A;       Home Medications    Prior to Admission medications   Medication Sig Start Date End Date Taking? Authorizing Provider  abacavir (ZIAGEN) 300 MG tablet Take 2 tablets (600 mg total) by mouth daily with supper. 12/14/15  Yes Carlyle Basques, MD  acetaminophen (TYLENOL) 500 MG tablet Take 1,000 mg by mouth every 6 (six) hours as needed (pain).   Yes Historical Provider, MD  albuterol (PROVENTIL HFA;VENTOLIN HFA) 108 (90 Base) MCG/ACT inhaler Inhale 2 puffs into the lungs every 6 (six) hours as needed for wheezing or shortness of breath. 04/19/16  Yes [name], MD  Ascorbic Acid (VITAMIN C) 250 MG CHEW Chew 500 mg by mouth daily.    Yes Historical Provider, MD  azithromycin (ZITHROMAX) 600 MG tablet Take 1,200 mg by mouth daily.   Yes Historical Provider, MD  Cyanocobalamin 2500 MCG CHEW Chew 5,000 mcg by mouth daily.   Yes Historical Provider, MD  dapsone 100 MG tablet Take 1 tablet (100 mg total) by mouth daily. 11/24/15  Yes Alexa Angela Burke, MD  darunavir-cobicistat  (PREZCOBIX) 800-150 MG tablet Take 1 tablet by mouth every evening. Swallow whole. Do NOT crush, break or chew tablets. Take with food. 12/14/15  Yes Carlyle Basques, MD  diltiazem (CARDIZEM LA) 180 MG 24 hr tablet Take 180 mg by mouth at bedtime.   Yes Historical Provider, MD  dolutegravir (TIVICAY) 50 MG tablet Take 1 tablet (50 mg total) by mouth every evening. 12/14/15  Yes Carlyle Basques, MD  hydrALAZINE (APRESOLINE) 100 MG tablet Take 0.5 tablets (50 mg total) by mouth 3 (three) times daily. 6:30am, 2:30pm, 11pm - for high blood pressure 03/21/16  Yes Burgess Estelle, MD  hydrocortisone (ANUSOL-HC) 25 MG suppository Place 25 mg rectally 2 (two) times daily.   Yes Historical Provider, MD  levETIRAcetam (KEPPRA) 250 MG tablet Take 1 tablet (250 mg) by mouth on Tuesday, Thursday, Saturday at 7pm Patient taking differently: Take 250-500 mg by mouth See admin instructions. Pt takes 250mg  once daily - takes extra 250mg  on dialysis days Wallie Char, Sunday = 500mg  03/13/16  Yes Lezlie Octave  Delice Lesch, MD  losartan (COZAAR) 100 MG tablet Take 100 mg by mouth at bedtime.   Yes Historical Provider, MD  ondansetron (ZOFRAN ODT) 8 MG disintegrating tablet Take 1 tablet (8 mg total) by mouth every 8 (eight) hours as needed for nausea or vomiting. 04/03/16  Yes Carlyle Basques, MD  sevelamer carbonate (RENVELA) 800 MG tablet Take 1,600-2,400 mg by mouth 3 (three) times daily with meals.   Yes Historical Provider, MD  venlafaxine XR (EFFEXOR-XR) 150 MG 24 hr capsule TAKE 1 CAPSULE(150 MG) BY MOUTH DAILY 04/19/16  Yes Holley Raring, MD  levETIRAcetam (KEPPRA) 500 MG tablet Take 1 tablet (500 mg total) by mouth daily. Patient not taking: Reported on 05/09/2016 03/21/16   Burgess Estelle, MD  triamcinolone (KENALOG) 0.025 % ointment Apply 1 application topically 2 (two) times daily. Do not apply to face Patient not taking: Reported on 05/09/2016 04/27/16   Antonietta Breach, PA-C    Family History Family History  Problem Relation Age of  Onset  . Hypertension Mother   . Diabetes Maternal Grandmother   . Kidney disease Maternal Grandmother     Social History Social History  Substance Use Topics  . Smoking status: Never Smoker  . Smokeless tobacco: Never Used  . Alcohol use No     Comment: I stopped drinking along time ago "     Allergies   Bactrim [sulfamethoxazole-trimethoprim]; Reglan [metoclopramide]; Vancomycin; Amlodipine; and Augmentin [amoxicillin-pot clavulanate]   Review of Systems Review of Systems  Constitutional: Negative for chills and fever.  HENT: Negative for congestion and facial swelling.   Eyes: Negative for discharge and visual disturbance.  Respiratory: Negative for shortness of breath.   Cardiovascular: Negative for chest pain and palpitations.  Gastrointestinal: Negative for abdominal pain, diarrhea and vomiting.  Musculoskeletal: Negative for arthralgias and myalgias.  Skin: Negative for color change and rash.  Neurological: Positive for seizures. Negative for tremors, syncope and headaches.  Psychiatric/Behavioral: Negative for confusion and dysphoric mood.     Physical Exam Updated Vital Signs BP (!) 163/114   Pulse 98   Temp 98.4 F (36.9 C) (Oral)   Resp 13   SpO2 94%   Physical Exam  Constitutional: He appears well-developed and well-nourished.  HENT:  Head: Normocephalic and atraumatic.  Eyes: EOM are normal. Pupils are equal, round, and reactive to light.  Neck: Normal range of motion. Neck supple. No JVD present.  Cardiovascular: Normal rate and regular rhythm.  Exam reveals no gallop and no friction rub.   No murmur heard. Pulmonary/Chest: No respiratory distress. He has no wheezes.  Abdominal: He exhibits no distension and no mass. There is no tenderness. There is no rebound and no guarding.  Musculoskeletal: Normal range of motion.  Neurological: He is alert.  Sleepy on exam, will open eyes and answer questions, but briefly.  Left arm twitching.  Skin: No rash  noted. No pallor.  Psychiatric: He has a normal mood and affect. His behavior is normal.  Nursing note and vitals reviewed.    ED Treatments / Results  Labs (all labs ordered are listed, but only abnormal results are displayed) Labs Reviewed  CBC WITH DIFFERENTIAL/PLATELET - Abnormal; Notable for the following:       Result Value   RBC 3.46 (*)    Hemoglobin 9.8 (*)    HCT 30.6 (*)    RDW 17.4 (*)    Platelets 144 (*)    Lymphs Abs 0.5 (*)    All other components within normal limits  COMPREHENSIVE METABOLIC PANEL - Abnormal; Notable for the following:    Sodium 134 (*)    Chloride 95 (*)    Glucose, Bld 122 (*)    BUN 43 (*)    Creatinine, Ser 9.77 (*)    Calcium 8.5 (*)    Albumin 2.9 (*)    AST 44 (*)    GFR calc non Af Amer 6 (*)    GFR calc Af Amer 7 (*)    All other components within normal limits  I-STAT CHEM 8, ED - Abnormal; Notable for the following:    Chloride 94 (*)    BUN 48 (*)    Creatinine, Ser 10.70 (*)    Glucose, Bld 124 (*)    Calcium, Ion 1.04 (*)    Hemoglobin 10.9 (*)    HCT 32.0 (*)    All other components within normal limits  CBG MONITORING, ED    EKG  EKG Interpretation None       Radiology Ct Head Wo Contrast  Result Date: 05/09/2016 CLINICAL DATA:  Headaches EXAM: CT HEAD WITHOUT CONTRAST TECHNIQUE: Contiguous axial images were obtained from the base of the skull through the vertex without intravenous contrast. COMPARISON:  01/17/2016 FINDINGS: Brain: No evidence of acute infarction, hemorrhage, hydrocephalus, extra-axial collection or mass lesion/mass effect. Vascular: No hyperdense vessel or unexpected calcification. Skull: Normal. Negative for fracture or focal lesion. Sinuses/Orbits: Mucosal thickening is noted in the maxillary antra bilaterally stable from the previous exam. Other: None IMPRESSION: No acute intracranial abnormality noted. Mucosal thickening in the maxillary antra bilaterally. Electronically Signed   By: Inez Catalina M.D.   On: 05/09/2016 14:25    Procedures Procedures (including critical care time)  Medications Ordered in ED Medications  sodium chloride 0.9 % bolus 500 mL (not administered)  hydrALAZINE (APRESOLINE) tablet 50 mg (not administered)  levETIRAcetam (KEPPRA) 1,000 mg in sodium chloride 0.9 % 100 mL IVPB (0 mg Intravenous Stopped 05/09/16 1415)  prochlorperazine (COMPAZINE) injection 10 mg (10 mg Intravenous Given 05/09/16 1456)  diphenhydrAMINE (BENADRYL) injection 25 mg (25 mg Intravenous Given 05/09/16 1456)  magnesium sulfate IVPB 2 g 50 mL (2 g Intravenous New Bag/Given 05/09/16 1551)  oxyCODONE (Oxy IR/ROXICODONE) immediate release tablet 5 mg (5 mg Oral Given 05/09/16 1547)     Initial Impression / Assessment and Plan / ED Course  I have reviewed the triage vital signs and the nursing notes.  Pertinent labs & imaging results that were available during my care of the patient were reviewed by me and considered in my medical decision making (see chart for details).     36 yo M With a chief complaint of feeling like he's going to have a seizure. Patient having some sleepiness and some twitching of his left upper extremity. Patient was recently diagnosed with seizures over the past year. His EEG at that time was negative.   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 As I review the old neurology notes this does seem to be an issue and the patient has missed dialysis. His symptoms have improved while he is in the ED. Continue to have a headache. Given a headache cocktail with improvement. Give magnesium small fluid bolus and a dose of his home hydralazine.  I feel the patient is likely safe for D/c post meds, encouraged for follow up closely with dialysis.   Turned over to Dr. Stark Jock.   The  patients results and plan were reviewed and discussed.   Any x-rays performed were  independently reviewed by myself.   Differential diagnosis were considered with the presenting HPI.  Medications  sodium chloride 0.9 % bolus 500 mL (not administered)  hydrALAZINE (APRESOLINE) tablet 50 mg (not administered)  levETIRAcetam (KEPPRA) 1,000 mg in sodium chloride 0.9 % 100 mL IVPB (0 mg Intravenous Stopped 05/09/16 1415)  prochlorperazine (COMPAZINE) injection 10 mg (10 mg Intravenous Given 05/09/16 1456)  diphenhydrAMINE (BENADRYL) injection 25 mg (25 mg Intravenous Given 05/09/16 1456)  magnesium sulfate IVPB 2 g 50 mL (2 g Intravenous New Bag/Given 05/09/16 1551)  oxyCODONE (Oxy IR/ROXICODONE) immediate release tablet 5 mg (5 mg Oral Given 05/09/16 1547)    Vitals:   05/09/16 1345 05/09/16 1400 05/09/16 1448 05/09/16 1500  BP:  159/93 (!) 166/109 (!) 163/114  Pulse: 106 101 102 98  Resp: 11 26 20 13   Temp:      TempSrc:      SpO2: 97% 91% 100% 94%    Final diagnoses:  Renovascular hypertension  Headache disorder  Myoclonus       Final Clinical Impressions(s) / ED Diagnoses   Final diagnoses:  Renovascular hypertension  Headache disorder  Myoclonus    New Prescriptions New Prescriptions   No medications on file     Deno Etienne, DO 05/09/16 1609

## 2016-05-09 NOTE — ED Notes (Signed)
Verified mag infusion rate with EDP. Confirmed 225ml/hr infusion.

## 2016-05-11 DIAGNOSIS — D631 Anemia in chronic kidney disease: Secondary | ICD-10-CM | POA: Diagnosis not present

## 2016-05-11 DIAGNOSIS — N186 End stage renal disease: Secondary | ICD-10-CM | POA: Diagnosis not present

## 2016-05-11 DIAGNOSIS — N2581 Secondary hyperparathyroidism of renal origin: Secondary | ICD-10-CM | POA: Diagnosis not present

## 2016-05-12 ENCOUNTER — Encounter (HOSPITAL_COMMUNITY): Payer: Self-pay

## 2016-05-12 ENCOUNTER — Inpatient Hospital Stay (HOSPITAL_COMMUNITY)
Admission: EM | Admit: 2016-05-12 | Discharge: 2016-05-21 | DRG: 640 | Disposition: A | Discharge status: Home / Self Care | Payer: Medicare Other | Attending: Emergency Medicine | Admitting: Emergency Medicine

## 2016-05-12 ENCOUNTER — Emergency Department (HOSPITAL_COMMUNITY): Payer: Medicare Other

## 2016-05-12 DIAGNOSIS — Z833 Family history of diabetes mellitus: Secondary | ICD-10-CM | POA: Diagnosis not present

## 2016-05-12 DIAGNOSIS — Z992 Dependence on renal dialysis: Secondary | ICD-10-CM

## 2016-05-12 DIAGNOSIS — R0789 Other chest pain: Secondary | ICD-10-CM | POA: Diagnosis not present

## 2016-05-12 DIAGNOSIS — I313 Pericardial effusion (noninflammatory): Secondary | ICD-10-CM | POA: Diagnosis not present

## 2016-05-12 DIAGNOSIS — B20 Human immunodeficiency virus [HIV] disease: Secondary | ICD-10-CM | POA: Diagnosis present

## 2016-05-12 DIAGNOSIS — N184 Chronic kidney disease, stage 4 (severe): Secondary | ICD-10-CM | POA: Diagnosis not present

## 2016-05-12 DIAGNOSIS — I319 Disease of pericardium, unspecified: Secondary | ICD-10-CM | POA: Diagnosis not present

## 2016-05-12 DIAGNOSIS — R079 Chest pain, unspecified: Secondary | ICD-10-CM | POA: Diagnosis not present

## 2016-05-12 DIAGNOSIS — I161 Hypertensive emergency: Secondary | ICD-10-CM | POA: Diagnosis not present

## 2016-05-12 DIAGNOSIS — R21 Rash and other nonspecific skin eruption: Secondary | ICD-10-CM | POA: Diagnosis not present

## 2016-05-12 DIAGNOSIS — N2581 Secondary hyperparathyroidism of renal origin: Secondary | ICD-10-CM | POA: Diagnosis not present

## 2016-05-12 DIAGNOSIS — Z9115 Patient's noncompliance with renal dialysis: Secondary | ICD-10-CM | POA: Diagnosis not present

## 2016-05-12 DIAGNOSIS — E8770 Fluid overload, unspecified: Secondary | ICD-10-CM | POA: Diagnosis not present

## 2016-05-12 DIAGNOSIS — J45909 Unspecified asthma, uncomplicated: Secondary | ICD-10-CM | POA: Diagnosis present

## 2016-05-12 DIAGNOSIS — Z515 Encounter for palliative care: Secondary | ICD-10-CM

## 2016-05-12 DIAGNOSIS — Z881 Allergy status to other antibiotic agents status: Secondary | ICD-10-CM | POA: Diagnosis not present

## 2016-05-12 DIAGNOSIS — N186 End stage renal disease: Secondary | ICD-10-CM | POA: Diagnosis present

## 2016-05-12 DIAGNOSIS — H538 Other visual disturbances: Secondary | ICD-10-CM

## 2016-05-12 DIAGNOSIS — Z7189 Other specified counseling: Secondary | ICD-10-CM

## 2016-05-12 DIAGNOSIS — F329 Major depressive disorder, single episode, unspecified: Secondary | ICD-10-CM | POA: Diagnosis present

## 2016-05-12 DIAGNOSIS — R51 Headache: Secondary | ICD-10-CM

## 2016-05-12 DIAGNOSIS — I120 Hypertensive chronic kidney disease with stage 5 chronic kidney disease or end stage renal disease: Secondary | ICD-10-CM | POA: Diagnosis present

## 2016-05-12 DIAGNOSIS — J9811 Atelectasis: Secondary | ICD-10-CM | POA: Diagnosis present

## 2016-05-12 DIAGNOSIS — I3139 Other pericardial effusion (noninflammatory): Secondary | ICD-10-CM

## 2016-05-12 DIAGNOSIS — R0600 Dyspnea, unspecified: Secondary | ICD-10-CM

## 2016-05-12 DIAGNOSIS — Z841 Family history of disorders of kidney and ureter: Secondary | ICD-10-CM | POA: Diagnosis not present

## 2016-05-12 DIAGNOSIS — D631 Anemia in chronic kidney disease: Secondary | ICD-10-CM | POA: Diagnosis present

## 2016-05-12 DIAGNOSIS — I10 Essential (primary) hypertension: Secondary | ICD-10-CM | POA: Diagnosis not present

## 2016-05-12 DIAGNOSIS — I160 Hypertensive urgency: Secondary | ICD-10-CM | POA: Diagnosis not present

## 2016-05-12 DIAGNOSIS — Z8249 Family history of ischemic heart disease and other diseases of the circulatory system: Secondary | ICD-10-CM | POA: Diagnosis not present

## 2016-05-12 DIAGNOSIS — I129 Hypertensive chronic kidney disease with stage 1 through stage 4 chronic kidney disease, or unspecified chronic kidney disease: Secondary | ICD-10-CM | POA: Diagnosis not present

## 2016-05-12 DIAGNOSIS — G4733 Obstructive sleep apnea (adult) (pediatric): Secondary | ICD-10-CM | POA: Diagnosis present

## 2016-05-12 DIAGNOSIS — R0602 Shortness of breath: Secondary | ICD-10-CM | POA: Diagnosis not present

## 2016-05-12 DIAGNOSIS — Z09 Encounter for follow-up examination after completed treatment for conditions other than malignant neoplasm: Secondary | ICD-10-CM

## 2016-05-12 DIAGNOSIS — R519 Headache, unspecified: Secondary | ICD-10-CM

## 2016-05-12 DIAGNOSIS — R109 Unspecified abdominal pain: Secondary | ICD-10-CM | POA: Diagnosis not present

## 2016-05-12 DIAGNOSIS — Z888 Allergy status to other drugs, medicaments and biological substances status: Secondary | ICD-10-CM | POA: Diagnosis not present

## 2016-05-12 DIAGNOSIS — R000 Tachycardia, unspecified: Secondary | ICD-10-CM | POA: Diagnosis not present

## 2016-05-12 DIAGNOSIS — Z86718 Personal history of other venous thrombosis and embolism: Secondary | ICD-10-CM

## 2016-05-12 DIAGNOSIS — J811 Chronic pulmonary edema: Secondary | ICD-10-CM

## 2016-05-12 DIAGNOSIS — Z79899 Other long term (current) drug therapy: Secondary | ICD-10-CM

## 2016-05-12 DIAGNOSIS — Z21 Asymptomatic human immunodeficiency virus [HIV] infection status: Secondary | ICD-10-CM | POA: Diagnosis not present

## 2016-05-12 DIAGNOSIS — Z9119 Patient's noncompliance with other medical treatment and regimen: Secondary | ICD-10-CM

## 2016-05-12 DIAGNOSIS — G40909 Epilepsy, unspecified, not intractable, without status epilepticus: Secondary | ICD-10-CM | POA: Diagnosis not present

## 2016-05-12 DIAGNOSIS — R509 Fever, unspecified: Secondary | ICD-10-CM | POA: Diagnosis not present

## 2016-05-12 DIAGNOSIS — G441 Vascular headache, not elsewhere classified: Secondary | ICD-10-CM | POA: Diagnosis not present

## 2016-05-12 LAB — CBC
HCT: 30.2 % — ABNORMAL LOW (ref 39.0–52.0)
Hemoglobin: 9.7 g/dL — ABNORMAL LOW (ref 13.0–17.0)
MCH: 29.3 pg (ref 26.0–34.0)
MCHC: 32.1 g/dL (ref 30.0–36.0)
MCV: 91.2 fL (ref 78.0–100.0)
Platelets: 99 10*3/uL — ABNORMAL LOW (ref 150–400)
RBC: 3.31 MIL/uL — ABNORMAL LOW (ref 4.22–5.81)
RDW: 18 % — ABNORMAL HIGH (ref 11.5–15.5)
WBC: 8.6 10*3/uL (ref 4.0–10.5)

## 2016-05-12 LAB — TROPONIN I
Troponin I: 0.16 ng/mL (ref <0.03)
Troponin I: 0.18 ng/mL (ref <0.03)

## 2016-05-12 LAB — BASIC METABOLIC PANEL
Anion gap: 18 — ABNORMAL HIGH (ref 5–15)
BUN: 53 mg/dL — ABNORMAL HIGH (ref 6–20)
CO2: 23 mmol/L (ref 22–32)
Calcium: 9.4 mg/dL (ref 8.9–10.3)
Chloride: 98 mmol/L — ABNORMAL LOW (ref 101–111)
Creatinine, Ser: 10.76 mg/dL — ABNORMAL HIGH (ref 0.61–1.24)
GFR calc Af Amer: 6 mL/min — ABNORMAL LOW (ref >60)
GFR calc non Af Amer: 6 mL/min — ABNORMAL LOW (ref >60)
Glucose, Bld: 88 mg/dL (ref 65–99)
Potassium: 4 mmol/L (ref 3.5–5.1)
Sodium: 139 mmol/L (ref 135–145)

## 2016-05-12 LAB — I-STAT TROPONIN, ED: Troponin i, poc: 0.16 ng/mL (ref 0.00–0.08)

## 2016-05-12 LAB — D-DIMER, QUANTITATIVE: D-Dimer, Quant: 3.6 ug/mL-FEU — ABNORMAL HIGH (ref 0.00–0.50)

## 2016-05-12 LAB — MRSA PCR SCREENING: MRSA by PCR: POSITIVE — AB

## 2016-05-12 MED ORDER — LORATADINE 10 MG PO TABS
10.0000 mg | ORAL_TABLET | Freq: Once | ORAL | Status: AC
  Administered 2016-05-12: 10 mg via ORAL
  Filled 2016-05-12: qty 1

## 2016-05-12 MED ORDER — ACETAMINOPHEN 325 MG PO TABS
650.0000 mg | ORAL_TABLET | Freq: Four times a day (QID) | ORAL | Status: DC | PRN
  Administered 2016-05-12 – 2016-05-17 (×4): 650 mg via ORAL
  Filled 2016-05-12 (×3): qty 2

## 2016-05-12 MED ORDER — LEVETIRACETAM 250 MG PO TABS
250.0000 mg | ORAL_TABLET | ORAL | Status: DC
  Administered 2016-05-13 – 2016-05-20 (×5): 250 mg via ORAL
  Filled 2016-05-12 (×5): qty 1

## 2016-05-12 MED ORDER — LEVETIRACETAM 500 MG PO TABS
500.0000 mg | ORAL_TABLET | Freq: Every day | ORAL | Status: DC
  Administered 2016-05-13 – 2016-05-21 (×9): 500 mg via ORAL
  Filled 2016-05-12 (×10): qty 1

## 2016-05-12 MED ORDER — PENTAFLUOROPROP-TETRAFLUOROETH EX AERO: 1.0000 "application " | INHALATION_SPRAY | CUTANEOUS | Status: DC | PRN

## 2016-05-12 MED ORDER — DARUNAVIR-COBICISTAT 800-150 MG PO TABS
1.0000 | ORAL_TABLET | Freq: Every evening | ORAL | Status: DC
  Administered 2016-05-12 – 2016-05-21 (×9): 1 via ORAL
  Filled 2016-05-12 (×11): qty 1

## 2016-05-12 MED ORDER — SEVELAMER CARBONATE 0.8 G PO PACK
1.6000 g | PACK | Freq: Three times a day (TID) | ORAL | Status: DC
  Administered 2016-05-12: 1.6 g via ORAL
  Filled 2016-05-12 (×5): qty 2

## 2016-05-12 MED ORDER — DAPSONE 100 MG PO TABS
100.0000 mg | ORAL_TABLET | Freq: Every day | ORAL | Status: DC
  Administered 2016-05-13 – 2016-05-21 (×9): 100 mg via ORAL
  Filled 2016-05-12 (×9): qty 1

## 2016-05-12 MED ORDER — IOPAMIDOL (ISOVUE-370) INJECTION 76%
INTRAVENOUS | Status: AC
  Filled 2016-05-12: qty 100

## 2016-05-12 MED ORDER — HYDRALAZINE HCL 25 MG PO TABS
50.0000 mg | ORAL_TABLET | Freq: Three times a day (TID) | ORAL | Status: DC
  Administered 2016-05-12 – 2016-05-17 (×15): 50 mg via ORAL
  Filled 2016-05-12 (×16): qty 2

## 2016-05-12 MED ORDER — IOPAMIDOL (ISOVUE-370) INJECTION 76%
INTRAVENOUS | Status: AC
  Administered 2016-05-12: 18:00:00
  Filled 2016-05-12: qty 50

## 2016-05-12 MED ORDER — ABACAVIR SULFATE 300 MG PO TABS
600.0000 mg | ORAL_TABLET | Freq: Every day | ORAL | Status: DC
  Administered 2016-05-12 – 2016-05-21 (×9): 600 mg via ORAL
  Filled 2016-05-12 (×11): qty 2

## 2016-05-12 MED ORDER — SODIUM CHLORIDE 0.9% FLUSH
3.0000 mL | Freq: Two times a day (BID) | INTRAVENOUS | Status: DC
  Administered 2016-05-12 – 2016-05-20 (×14): 3 mL via INTRAVENOUS

## 2016-05-12 MED ORDER — DOXERCALCIFEROL 4 MCG/2ML IV SOLN
6.0000 ug | INTRAVENOUS | Status: DC
  Administered 2016-05-16 – 2016-05-20 (×3): 6 ug via INTRAVENOUS
  Filled 2016-05-12 (×4): qty 4

## 2016-05-12 MED ORDER — DOLUTEGRAVIR SODIUM 50 MG PO TABS
50.0000 mg | ORAL_TABLET | Freq: Every evening | ORAL | Status: DC
  Administered 2016-05-12 – 2016-05-21 (×9): 50 mg via ORAL
  Filled 2016-05-12 (×10): qty 1

## 2016-05-12 MED ORDER — ACETAMINOPHEN 650 MG RE SUPP: 650.0000 mg | Freq: Four times a day (QID) | RECTAL | Status: DC | PRN

## 2016-05-12 MED ORDER — ACETAMINOPHEN 500 MG PO TABS: 1000.0000 mg | ORAL_TABLET | Freq: Four times a day (QID) | ORAL | Status: DC | PRN

## 2016-05-12 MED ORDER — CHLORHEXIDINE GLUCONATE CLOTH 2 % EX PADS
6.0000 | MEDICATED_PAD | Freq: Every day | CUTANEOUS | Status: AC
  Administered 2016-05-14 – 2016-05-17 (×4): 6 via TOPICAL

## 2016-05-12 MED ORDER — LOSARTAN POTASSIUM 50 MG PO TABS
100.0000 mg | ORAL_TABLET | Freq: Every day | ORAL | Status: DC
  Administered 2016-05-12 – 2016-05-19 (×8): 100 mg via ORAL
  Filled 2016-05-12 (×9): qty 2

## 2016-05-12 MED ORDER — IPRATROPIUM-ALBUTEROL 0.5-2.5 (3) MG/3ML IN SOLN: 3.0000 mL | Freq: Four times a day (QID) | RESPIRATORY_TRACT | Status: DC | PRN

## 2016-05-12 MED ORDER — FUROSEMIDE 10 MG/ML IJ SOLN
80.0000 mg | Freq: Once | INTRAMUSCULAR | Status: AC
  Administered 2016-05-12: 80 mg via INTRAVENOUS
  Filled 2016-05-12: qty 8

## 2016-05-12 MED ORDER — SODIUM CHLORIDE 0.9 % IV SOLN: 100.0000 mL | INTRAVENOUS | Status: DC | PRN

## 2016-05-12 MED ORDER — VITAMIN C 250 MG PO TABS
500.0000 mg | ORAL_TABLET | Freq: Every day | ORAL | Status: DC
  Filled 2016-05-12: qty 2

## 2016-05-12 MED ORDER — HYDRALAZINE HCL 20 MG/ML IJ SOLN
5.0000 mg | Freq: Four times a day (QID) | INTRAMUSCULAR | Status: DC | PRN
  Administered 2016-05-12 – 2016-05-19 (×5): 5 mg via INTRAVENOUS
  Filled 2016-05-12 (×4): qty 1

## 2016-05-12 MED ORDER — HYDROCORTISONE 1 % EX CREA
TOPICAL_CREAM | CUTANEOUS | Status: DC | PRN
  Filled 2016-05-12: qty 28

## 2016-05-12 MED ORDER — ALBUTEROL SULFATE HFA 108 (90 BASE) MCG/ACT IN AERS: 2.0000 | INHALATION_SPRAY | Freq: Four times a day (QID) | RESPIRATORY_TRACT | Status: DC | PRN

## 2016-05-12 MED ORDER — ALTEPLASE 2 MG IJ SOLR: 2.0000 mg | Freq: Once | INTRAMUSCULAR | Status: DC | PRN

## 2016-05-12 MED ORDER — HEPARIN SODIUM (PORCINE) 5000 UNIT/ML IJ SOLN
5000.0000 [IU] | Freq: Three times a day (TID) | INTRAMUSCULAR | Status: DC
  Administered 2016-05-12 – 2016-05-18 (×10): 5000 [IU] via SUBCUTANEOUS
  Filled 2016-05-12 (×13): qty 1

## 2016-05-12 MED ORDER — ONDANSETRON HCL 4 MG/2ML IJ SOLN
4.0000 mg | Freq: Once | INTRAMUSCULAR | Status: AC
  Administered 2016-05-12: 4 mg via INTRAVENOUS
  Filled 2016-05-12: qty 2

## 2016-05-12 MED ORDER — HYDRALAZINE HCL 25 MG PO TABS
50.0000 mg | ORAL_TABLET | Freq: Once | ORAL | Status: AC
  Administered 2016-05-12: 50 mg via ORAL
  Filled 2016-05-12: qty 2

## 2016-05-12 MED ORDER — HEPARIN SODIUM (PORCINE) 1000 UNIT/ML DIALYSIS
1000.0000 [IU] | INTRAMUSCULAR | Status: DC | PRN
  Filled 2016-05-12: qty 1

## 2016-05-12 MED ORDER — RENA-VITE PO TABS
1.0000 | ORAL_TABLET | Freq: Every day | ORAL | Status: DC
  Administered 2016-05-12 – 2016-05-17 (×4): 1 via ORAL
  Filled 2016-05-12 (×5): qty 1

## 2016-05-12 MED ORDER — LIDOCAINE-PRILOCAINE 2.5-2.5 % EX CREA
1.0000 "application " | TOPICAL_CREAM | CUTANEOUS | Status: DC | PRN
  Filled 2016-05-12: qty 5

## 2016-05-12 MED ORDER — MUPIROCIN 2 % EX OINT
1.0000 "application " | TOPICAL_OINTMENT | Freq: Two times a day (BID) | CUTANEOUS | Status: AC
  Administered 2016-05-12 – 2016-05-17 (×9): 1 via NASAL
  Filled 2016-05-12: qty 22

## 2016-05-12 MED ORDER — LIDOCAINE HCL (PF) 1 % IJ SOLN: 5.0000 mL | INTRAMUSCULAR | Status: DC | PRN

## 2016-05-12 MED ORDER — IOPAMIDOL (ISOVUE-370) INJECTION 76%
INTRAVENOUS | Status: AC
  Administered 2016-05-12: 100 mL
  Filled 2016-05-12: qty 100

## 2016-05-12 NOTE — ED Triage Notes (Signed)
Pt from home by [facility] for SOB and HTN. Pt blood pressure was 216/146 with EMS. Pt has not missed dialysis but did not have a full treatment yesterday. Pt has not felt good since October and got worse over the last 4 weeks. Pt has has the shakes and doesn't know why. Pt has history of seizures and has not taken meds today.

## 2016-05-12 NOTE — Progress Notes (Signed)
Pt c/o mildly SOB at rest with no adventitious breath sounds. O2 sat = 98%. O2 increased from 1L [place] to 2L [place]. PRN Hydralazine given for HTN. Hemodialysis unable to take patient until about 2AM. MD paged. Will continue to monitor.  Pt also with rash to bilateral ears and right hand. Hydrocortisone cream ordered per standing orders, however pt states this med will not work. Pt's Benadryl request paged to MD.

## 2016-05-12 NOTE — Consult Note (Signed)
[facility] Renal Consultation Note    Indication for Consultation:  Management of ESRD/hemodialysis; anemia, hypertension/volume and secondary hyperparathyroidism  HPI: Jim Wyatt is a 36 y.o. 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 Jim Wyatt presented to ED this morning with worsening SOB, chest pain and elevated BP. He reports that he has been feeling SOB since having bowel surgery in October 2017 but has been progressively worsening and felt like he was "gasping for air" this morning.  ED course significant for BP 216/146 SpO2 94% on 2L [place] elevated D-dimer 3.6. CXR with mild vascular congestion. Currently seen in ED awaiting chest CT. Will be admitted for further evaluation. He reports dyspnea, chest "pressure".  Recently seen in ED on 3/13 with headache and concerns for seizure. He had a head CT with no acute findings.  Currently dialyzes at [facility]. Dialysis compliance has been a problem for him. He is usually getting under 3 hours of dialysis of late. Last HD was Thursday 3/15 for 2h 38. Left 6kg over EDW.  He states he has been unable to tolerate full treatments with severe headaches during treatment. Per center notes has not been compliant with BP meds. SBPs have been between 180-200 during pre and post treatments.   Past Medical History:  Diagnosis Date  . Asthma   . CAP (community acquired pneumonia) 10/2013  . Cellulitis 11/2013   LLE  . DVT (deep venous thrombosis) ([place]) 10/2013   BLE  . ESRD on hemodialysis ([place]) 01/2015   biopsy proven FSGS with ATN on renal bx from Sept 2015. Went on HD Dec 2016.   [name] Fever   . Headache   . HIV infection ([place]) dx'd 10/2013  . Hypertension   . Leg pain   . Multiple environmental allergies    "trees, dogs, cats"  . Multiple food allergies   . Shingles < 2010  . 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 AV  FISTULA PLACEMENT Left 03/03/2015   Procedure: Creation of Left Arm RADIOCEPHALIC ARTERIOVENOUS FISTULA ;  Surgeon: Mal Misty, MD;  Location: [place];  Service: Vascular;  Laterality: Left;  . COLONOSCOPY N/A 03/18/2016   Procedure: COLONOSCOPY;  Surgeon: Manus Gunning, MD;  Location: [place];  Service: Gastroenterology;  Laterality: N/A;  . FEMUR FRACTURE SURGERY Right 09/2001  . FRACTURE SURGERY    . I&D EXTREMITY Right 12/18/2014   Procedure: IRRIGATION AND DEBRIDEMENT RIGHT LOWER EXTREMITY;  Surgeon: Ralene Ok, MD;  Location: [place];  Service: General;  Laterality: Right;  . INSERTION OF DIALYSIS CATHETER  2016  . LAPAROTOMY N/A 12/28/2015   Procedure: EXPLORATORY LAPAROTOMY BOWEL RESECTION ILEOCECECTOMY;  Surgeon: Clovis Riley, MD;  Location: [place];  Service: General;  Laterality: N/A;   Family History  Problem Relation Age of Onset  . Hypertension Mother   . Diabetes Maternal Grandmother   . Kidney disease Maternal Grandmother    Social History:  reports that he has never smoked. He has never used smokeless tobacco. He reports that he does not drink alcohol or use drugs. Allergies  Allergen Reactions  . Bactrim [Sulfamethoxazole-Trimethoprim] Other (See Comments)    Renal failure  . Reglan [Metoclopramide] Other (See Comments)    Causes ticks/ jerks  . Vancomycin Other (See Comments)    Patient states vancomycin caused kidney injury  . Amlodipine Palpitations and Other (See Comments)    fatigue  .  Augmentin [Amoxicillin-Pot Clavulanate] Hives   Prior to Admission medications   Medication Sig Start Date End Date Taking? Authorizing Provider  abacavir (ZIAGEN) 300 MG tablet Take 2 tablets (600 mg total) by mouth daily with supper. 12/14/15  Yes Carlyle Basques, MD  acetaminophen (TYLENOL) 500 MG tablet Take 1,000 mg by mouth every 6 (six) hours as needed (pain).   Yes Historical Provider, MD  albuterol (PROVENTIL HFA;VENTOLIN HFA) 108 (90 Base) MCG/ACT  inhaler Inhale 2 puffs into the lungs every 6 (six) hours as needed for wheezing or shortness of breath. 04/19/16  Yes [name], MD  Ascorbic Acid (VITAMIN C) 250 MG CHEW Chew 500 mg by mouth daily.    Yes Historical Provider, MD  azithromycin (ZITHROMAX) 600 MG tablet Take 1,200 mg by mouth once a week.    Yes Historical Provider, MD  dapsone 100 MG tablet Take 1 tablet (100 mg total) by mouth daily. 11/24/15  Yes Alexa Angela Burke, MD  darunavir-cobicistat (PREZCOBIX) 800-150 MG tablet Take 1 tablet by mouth every evening. Swallow whole. Do NOT crush, break or chew tablets. Take with food. 12/14/15  Yes Carlyle Basques, MD  dolutegravir (TIVICAY) 50 MG tablet Take 1 tablet (50 mg total) by mouth every evening. 12/14/15  Yes Carlyle Basques, MD  hydrALAZINE (APRESOLINE) 100 MG tablet Take 0.5 tablets (50 mg total) by mouth 3 (three) times daily. 6:30am, 2:30pm, 11pm - for high blood pressure Patient taking differently: Take 100 mg by mouth 3 (three) times daily. 6:30am, 2:30pm, 11pm - for high blood pressure  03/21/16  Yes Burgess Estelle, MD  levETIRAcetam (KEPPRA) 250 MG tablet Take 1 tablet (250 mg) by mouth on Tuesday, Thursday, Saturday at 7pm Patient taking differently: Take 250-500 mg by mouth See admin instructions. takes extra 250mg  on dialysis days Wallie Char, Sunday 03/13/16  Yes Cameron Sprang, MD  levETIRAcetam (KEPPRA) 500 MG tablet Take 1 tablet (500 mg total) by mouth daily. 03/21/16  Yes Burgess Estelle, MD  losartan (COZAAR) 100 MG tablet Take 100 mg by mouth at bedtime.   Yes Historical Provider, MD  ondansetron (ZOFRAN ODT) 8 MG disintegrating tablet Take 1 tablet (8 mg total) by mouth every 8 (eight) hours as needed for nausea or vomiting. Patient not taking: Reported on 05/12/2016 04/03/16   Carlyle Basques, MD  triamcinolone (KENALOG) 0.025 % ointment Apply 1 application topically 2 (two) times daily. Do not apply to face Patient not taking: Reported on 05/09/2016 04/27/16   Antonietta Breach,  PA-C  venlafaxine XR (EFFEXOR-XR) 150 MG 24 hr capsule TAKE 1 CAPSULE(150 MG) BY MOUTH DAILY Patient not taking: Reported on 05/12/2016 04/19/16   Holley Raring, MD   No current facility-administered medications for this encounter.    Current Outpatient Prescriptions  Medication Sig Dispense Refill  . abacavir (ZIAGEN) 300 MG tablet Take 2 tablets (600 mg total) by mouth daily with supper. 60 tablet 11  . acetaminophen (TYLENOL) 500 MG tablet Take 1,000 mg by mouth every 6 (six) hours as needed (pain).    [name] albuterol (PROVENTIL HFA;VENTOLIN HFA) 108 (90 Base) MCG/ACT inhaler Inhale 2 puffs into the lungs every 6 (six) hours as needed for wheezing or shortness of breath. 3 Inhaler 3  . Ascorbic Acid (VITAMIN C) 250 MG CHEW Chew 500 mg by mouth daily.     [name] azithromycin (ZITHROMAX) 600 MG tablet Take 1,200 mg by mouth once a week.     . dapsone 100 MG tablet Take 1 tablet (100 mg total) by  mouth daily. 30 tablet 11  . darunavir-cobicistat (PREZCOBIX) 800-150 MG tablet Take 1 tablet by mouth every evening. Swallow whole. Do NOT crush, break or chew tablets. Take with food. 30 tablet 11  . dolutegravir (TIVICAY) 50 MG tablet Take 1 tablet (50 mg total) by mouth every evening. 30 tablet 11  . hydrALAZINE (APRESOLINE) 100 MG tablet Take 0.5 tablets (50 mg total) by mouth 3 (three) times daily. 6:30am, 2:30pm, 11pm - for high blood pressure (Patient taking differently: Take 100 mg by mouth 3 (three) times daily. 6:30am, 2:30pm, 11pm - for high blood pressure ) 60 tablet 1  . levETIRAcetam (KEPPRA) 250 MG tablet Take 1 tablet (250 mg) by mouth on Tuesday, Thursday, Saturday at 7pm (Patient taking differently: Take 250-500 mg by mouth See admin instructions. takes extra 250mg  on dialysis days Tu, Thurs, Sunday) 60 tablet 11  . levETIRAcetam (KEPPRA) 500 MG tablet Take 1 tablet (500 mg total) by mouth daily. 60 tablet 11  . losartan (COZAAR) 100 MG tablet Take 100 mg by mouth at bedtime.    . ondansetron  (ZOFRAN ODT) 8 MG disintegrating tablet Take 1 tablet (8 mg total) by mouth every 8 (eight) hours as needed for nausea or vomiting. (Patient not taking: Reported on 05/12/2016) 30 tablet 11  . triamcinolone (KENALOG) 0.025 % ointment Apply 1 application topically 2 (two) times daily. Do not apply to face (Patient not taking: Reported on 05/09/2016) 15 g 0  . venlafaxine XR (EFFEXOR-XR) 150 MG 24 hr capsule TAKE 1 CAPSULE(150 MG) BY MOUTH DAILY (Patient not taking: Reported on 05/12/2016) 30 capsule 0    ROS: As per HPI otherwise negative.  Physical Exam: Vitals:   05/12/16 1345 05/12/16 1400 05/12/16 1430 05/12/16 1440  BP: (!) 180/108 (!) 170/112 (!) 170/111 (!) 170/111  Pulse: (!) 108 (!) 113 (!) 109 (!) 110  Resp: 16 (!) 22 14 20   Temp:      TempSrc:      SpO2: 97% 97% 94% 97%  Weight:      Height:         General: Thin alert NAD on nasal oxygen  Head: NCAT sclera not icteric MMM Neck: Supple. No JVD No masses Lungs: CTA bilaterally without wheezes or rhonchi. +crackles to left lung field Breathing is unlabored. Heart: RRR with S1 S2 Abdomen: soft NT + BS Lower extremities: 1+ LE edema  Neuro: A & O  X 3. Moves all extremities spontaneously. Psych:  Responds to questions appropriately with a normal affect. Dialysis Access: LUE AVF +thrill   Labs: Basic Metabolic Panel:  Recent Labs Lab 05/09/16 1336 05/09/16 1350 05/12/16 0747  NA 134* 135 139  K 3.8 3.9 4.0  CL 95* 94* 98*  CO2 27  --  23  GLUCOSE 122* 124* 88  BUN 43* 48* 53*  CREATININE 9.77* 10.70* 10.76*  CALCIUM 8.5*  --  9.4   Liver Function Tests:  Recent Labs Lab 05/09/16 1336  AST 44*  ALT 34  ALKPHOS 45  BILITOT 0.8  PROT 7.3  ALBUMIN 2.9*   No results for input(s): LIPASE, AMYLASE in the last 168 hours. No results for input(s): AMMONIA in the last 168 hours. CBC:  Recent Labs Lab 05/09/16 1336 05/09/16 1350 05/12/16 0747  WBC 4.6  --  8.6  NEUTROABS 2.9  --   --   HGB 9.8* 10.9* 9.7*   HCT 30.6* 32.0* 30.2*  MCV 88.4  --  91.2  PLT 144*  --  99*  Cardiac Enzymes: No results for input(s): CKTOTAL, CKMB, CKMBINDEX, TROPONINI in the last 168 hours. CBG: No results for input(s): GLUCAP in the last 168 hours. Iron Studies: No results for input(s): IRON, TIBC, TRANSFERRIN, FERRITIN in the last 72 hours. Studies/Results: Dg Chest 2 View  Result Date: 05/12/2016 CLINICAL DATA:  Shortness of breath and chest pain EXAM: CHEST  2 VIEW COMPARISON:  04/27/2016 FINDINGS: Cardiac shadow remains enlarged. Mild increased vascular congestion is noted without significant edema or effusion. No focal infiltrate is identified. No bony abnormality is seen. IMPRESSION: Changes consistent with mild central vascular congestion. Electronically Signed   By: Inez Catalina M.D.   On: 05/12/2016 08:11   Ct Angio Chest Pe W Or Wo Contrast  Result Date: 05/12/2016 CLINICAL DATA:  Chest pain and shortness of breath since yesterday. EXAM: CT ANGIOGRAPHY CHEST WITH CONTRAST TECHNIQUE: Multidetector CT imaging of the chest was performed using the standard protocol during bolus administration of intravenous contrast. Multiplanar CT image reconstructions and MIPs were obtained to evaluate the vascular anatomy. CONTRAST:  180 cc of Isovue 370 COMPARISON:  Chest radiographs dated 05/12/2016. FINDINGS: Cardiovascular: Initial images obtained with 100 cc of Isovue 370 did not demonstrate adequate opacification of the pulmonary arteries. Therefore, repeat images were obtained with an additional 80 cc of Isovue 370. These demonstrate normally opacified pulmonary arteries with no pulmonary arterial filling defects. The heart is enlarged. The pericardial effusion is demonstrated, measuring 1.9 cm in maximum thickness. Mediastinum/Nodes: No enlarged mediastinal, hilar, or axillary lymph nodes. Thyroid gland, trachea, and esophagus demonstrate no significant findings. Lungs/Pleura: Prominent pulmonary vasculature. Small left  pleural effusion. Upper Abdomen: No acute abnormality. Musculoskeletal: Minimal thoracic spine degenerative changes. Review of the MIP images confirms the above findings. IMPRESSION: 1. No pulmonary emboli. 2. Small to moderate-sized pericardial effusion. 3. Small left pleural effusion. 4. Cardiomegaly and pulmonary vascular congestion. Electronically Signed   By: Claudie Revering M.D.   On: 05/12/2016 16:03    Dialysis Orders:  [facility] TTS 3h 72mins BFR 450/800 2/2 EDW 65.5kg  -Hectorol 6 mcg IV q HD -Mircera 200 mcg IV 2 weeks (last dose 3/10)  -Venofer 50 mg IV q week (to start 3/22)  Assessment/Plan: 1.  Dyspnea/Chest pain - per primary CT angio pending  2.  ESRD -  TTS - will place orders for HD today with excess volume and dyspnea d/t frequent short tmts  then resume schedule tomorrow  3.  Hypertension/volume  - BP elevated reports intolerance with multiple BP meds - now on hydrazlyine/ Left 6 kg over last HD but limit UF goal 3L today with headaches during HD  4.  Anemia  - Hgb 9.7 follow on esa  5.  Metabolic bone disease -  Cont VDRA/Renvela binder 6.  Nutrition - Renal diet/vitamins 7. HIV/AIDS - on ART per primary 8. Seizure disorder - on [place] PA-C [place] Pager [phone] 05/12/2016, 3:11 PM   I have seen and examined this patient and agree with plan and assessment in the above note with renal recommendations/intervention highlighted.  Discussed the importance of compliance with HD and BP control.  CT angio performed and was negative for PE but did show pericardial effusion and left pleural effusion.  Moderate pericardial effusion likely due to poor dialysis compliance.  Plan for HD for UF and will get back on schedule tomorrow.   Governor Rooks Draya Felker,MD 05/12/2016 4:23 PM

## 2016-05-12 NOTE — ED Provider Notes (Signed)
[facility] DEPT Provider Note   CSN: [id number] Arrival date & time: 05/12/16  3016     History   Chief Complaint Chief Complaint  Patient presents with  . Hypertension  . Shortness of Breath    HPI Jim Wyatt is a 36 y.o. male.  Patient is a 36 year male with a history of HIV, hypertension and end-stage renal disease on dialysis who presents with shortness of breath. He states he's been short of breath for about the last week. He feels like he is fluid overloaded. He has puffiness to his face and feels like he is holding onto fluid in his belly. He has some mild leg swelling. He has a little bit of a cough. He denies any fevers. He does dialysis on Tuesday Thursday Saturday and he did go to dialysis yesterday but he stopped early because he developed a headache. He states he normally developed a headache of a pool of fluid off too fast. He has a little bit of associated chest pain.      Past Medical History:  Diagnosis Date  . Asthma   . CAP (community acquired pneumonia) 10/2013  . Cellulitis 11/2013   LLE  . DVT (deep venous thrombosis) ([place]) 10/2013   BLE  . 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 Went on HD Dec 2016.   [name] Fever   . Headache   . HIV infection ([place]) dx'd 10/2013  . Hypertension   . Leg pain   . Multiple environmental allergies    "trees, dogs, cats"  . Multiple food allergies   . Shingles < 2010  . 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 Shortness of breath 05/12/2016  . OSA (obstructive sleep apnea) 04/19/2016  . Hospital discharge follow-up 04/19/2016  . Depression 03/24/2016  . Breast lump or mass   . Rectal bleeding   . GI bleed 03/15/2016  . Hyperkalemia   . New onset seizure ([place]) 03/13/2016  . Symptomatic anemia 02/01/2016  . Dysuria 01/18/2016  . Hematochezia 01/09/2016  . Postoperative anemia due to acute blood  loss 01/09/2016  . Seizure disorder ([place]) 12/29/2015  . Intussusception ([place]) 12/28/2015  . Hypertension 12/28/2015  . Otitis externa of both ears 11/21/2015  . AIDS (acquired immunodeficiency syndrome), CD4 <=200 ([place])   . Meningitis   . Cephalalgia   . Blurry vision, bilateral   . Headache   . Hypertensive urgency   . New onset of headache in immunocompromised patient ([place]) 07/06/2015  . ESRD on dialysis ([place]) 04/27/2015  . CAP (community acquired pneumonia) 02/25/2015  . ESRD (end stage renal disease) ([place]) 02/16/2015  . History of DVT (deep vein thrombosis) 01/12/2015  . Candidal esophagitis ([place])   . Constipation   . Nausea without vomiting 12/20/2014  . Abscess of right thigh 12/19/2014  . Chronic kidney disease (CKD), stage IV (severe) ([place]) 12/19/2014  . 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 Anemia due to end stage renal disease ([place]) 12/19/2014  . Hyponatremia 12/19/2014  . Community acquired pneumonia 07/30/2014  . Anemia of chronic disease 07/30/2014  . Acute on chronic renal failure ([place])   . 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 Human immunodeficiency virus (HIV) disease ([place]) 12/11/2013  . CMV (cytomegalovirus infection) ([place]) 12/07/2013  . Protein-calorie malnutrition, severe ([place]) 12/06/2013  . 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 DVT (deep venous  thrombosis) ([place]) 11/24/2013  . Asthma, chronic 11/24/2013  . Acquired immune deficiency syndrome ([place]) 11/14/2013  . AKI (acute kidney injury) ([place]) 11/11/2013  . Elevated liver enzymes 11/09/2013  . History of shingles 11/09/2013  . Obesity 11/09/2013  . Abdominal pain 11/07/2013  . Asthma     Past Surgical History:  Procedure Laterality Date  . AV FISTULA PLACEMENT Left 03/03/2015   Procedure: Creation of Left Arm RADIOCEPHALIC ARTERIOVENOUS FISTULA ;  Surgeon: Mal Misty, MD;  Location: [facility];  Service: Vascular;  Laterality: Left;  . COLONOSCOPY  N/A 03/18/2016   Procedure: COLONOSCOPY;  Surgeon: Manus Gunning, MD;  Location: [facility];  Service: Gastroenterology;  Laterality: N/A;  . FEMUR FRACTURE SURGERY Right 09/2001  . FRACTURE SURGERY    . I&D EXTREMITY Right 12/18/2014   Procedure: IRRIGATION AND DEBRIDEMENT RIGHT LOWER EXTREMITY;  Surgeon: Ralene Ok, MD;  Location: [facility];  Service: General;  Laterality: Right;  . INSERTION OF DIALYSIS CATHETER  2016  . LAPAROTOMY N/A 12/28/2015   Procedure: EXPLORATORY LAPAROTOMY BOWEL RESECTION ILEOCECECTOMY;  Surgeon: Clovis Riley, MD;  Location: [facility];  Service: General;  Laterality: N/A;       Home Medications    Prior to Admission medications   Medication Sig Start Date End Date Taking? Authorizing Provider  abacavir (ZIAGEN) 300 MG tablet Take 2 tablets (600 mg total) by mouth daily with supper. 12/14/15  Yes Carlyle Basques, MD  acetaminophen (TYLENOL) 500 MG tablet Take 1,000 mg by mouth every 6 (six) hours as needed (pain).   Yes Historical Provider, MD  albuterol (PROVENTIL HFA;VENTOLIN HFA) 108 (90 Base) MCG/ACT inhaler Inhale 2 puffs into the lungs every 6 (six) hours as needed for wheezing or shortness of breath. 04/19/16  Yes [name], MD  Ascorbic Acid (VITAMIN C) 250 MG CHEW Chew 500 mg by mouth daily.    Yes Historical Provider, MD  azithromycin (ZITHROMAX) 600 MG tablet Take 1,200 mg by mouth once a week.    Yes Historical Provider, MD  dapsone 100 MG tablet Take 1 tablet (100 mg total) by mouth daily. 11/24/15  Yes Alexa Angela Burke, MD  darunavir-cobicistat (PREZCOBIX) 800-150 MG tablet Take 1 tablet by mouth every evening. Swallow whole. Do NOT crush, break or chew tablets. Take with food. 12/14/15  Yes Carlyle Basques, MD  dolutegravir (TIVICAY) 50 MG tablet Take 1 tablet (50 mg total) by mouth every evening. 12/14/15  Yes Carlyle Basques, MD  hydrALAZINE (APRESOLINE) 100 MG tablet Take 0.5 tablets (50 mg total) by mouth 3 (three) times daily.  6:30am, 2:30pm, 11pm - for high blood pressure Patient taking differently: Take 100 mg by mouth 3 (three) times daily. 6:30am, 2:30pm, 11pm - for high blood pressure  03/21/16  Yes Burgess Estelle, MD  levETIRAcetam (KEPPRA) 250 MG tablet Take 1 tablet (250 mg) by mouth on Tuesday, Thursday, Saturday at 7pm Patient taking differently: Take 250-500 mg by mouth See admin instructions. takes extra 250mg  on dialysis days [place] Char, Sunday 03/13/16  Yes Cameron Sprang, MD  levETIRAcetam (KEPPRA) 500 MG tablet Take 1 tablet (500 mg total) by mouth daily. 03/21/16  Yes Burgess Estelle, MD  losartan (COZAAR) 100 MG tablet Take 100 mg by mouth at bedtime.   Yes Historical Provider, MD  ondansetron (ZOFRAN ODT) 8 MG disintegrating tablet Take 1 tablet (8 mg total) by mouth every 8 (eight) hours as needed for nausea or vomiting. Patient not taking: Reported on 05/12/2016 04/03/16   Carlyle Basques,  MD  triamcinolone (KENALOG) 0.025 % ointment Apply 1 application topically 2 (two) times daily. Do not apply to face Patient not taking: Reported on 05/09/2016 04/27/16   Antonietta Breach, PA-C  venlafaxine XR (EFFEXOR-XR) 150 MG 24 hr capsule TAKE 1 CAPSULE(150 MG) BY MOUTH DAILY Patient not taking: Reported on 05/12/2016 04/19/16   Holley Raring, MD    Family History Family History  Problem Relation Age of Onset  . Hypertension Mother   . Diabetes Maternal Grandmother   . Kidney disease Maternal Grandmother     Social History Social History  Substance Use Topics  . Smoking status: Never Smoker  . Smokeless tobacco: Never Used  . Alcohol use No     Comment: I stopped drinking along time ago "     Allergies   Bactrim [sulfamethoxazole-trimethoprim]; Reglan [metoclopramide]; Vancomycin; Amlodipine; and Augmentin [amoxicillin-pot clavulanate]   Review of Systems Review of Systems  Constitutional: Positive for fatigue. Negative for chills, diaphoresis and fever.  HENT: Negative for congestion, rhinorrhea and sneezing.    Eyes: Negative.   Respiratory: Positive for cough and shortness of breath. Negative for chest tightness.   Cardiovascular: Positive for chest pain and leg swelling.  Gastrointestinal: Negative for abdominal pain, blood in stool, diarrhea, nausea and vomiting.  Genitourinary: Negative for difficulty urinating, flank pain, frequency and hematuria.  Musculoskeletal: Negative for arthralgias and back pain.  Skin: Positive for rash (Has had a rash to his hands for the last several months).  Neurological: Negative for dizziness, speech difficulty, weakness, numbness and headaches.     Physical Exam Updated Vital Signs BP (!) 170/111 (BP Location: Left Arm)   Pulse (!) 110   Temp 98.5 F (36.9 C) (Oral)   Resp 20   Ht 5\' 7"  (1.702 m)   Wt 152 lb 1.9 oz (69 kg)   SpO2 97%   BMI 23.82 kg/m   Physical Exam  Constitutional: He is oriented to person, place, and time. He appears well-developed and well-nourished.  HENT:  Head: Normocephalic and atraumatic.  Eyes: Pupils are equal, round, and reactive to light.  Neck: Normal range of motion. Neck supple.  Cardiovascular: Normal rate and regular rhythm.  Exam reveals gallop.   Murmur heard. Pulmonary/Chest: Effort normal. No respiratory distress. He has no wheezes. He has rales. He exhibits no tenderness.  Abdominal: Soft. Bowel sounds are normal. There is no tenderness. There is no rebound and no guarding.  Musculoskeletal: Normal range of motion. He exhibits edema (2+ pain edema bilaterally).  Lymphadenopathy:    He has no cervical adenopathy.  Neurological: He is alert and oriented to person, place, and time.  Skin: Skin is warm and dry. No rash noted.  Psychiatric: He has a normal mood and affect.     ED Treatments / Results  Labs (all labs ordered are listed, but only abnormal results are displayed) Labs Reviewed  BASIC METABOLIC PANEL - Abnormal; Notable for the following:       Result Value   Chloride 98 (*)    BUN 53 (*)     Creatinine, Ser 10.76 (*)    GFR calc non Af Amer 6 (*)    GFR calc Af Amer 6 (*)    Anion gap 18 (*)    All other components within normal limits  CBC - Abnormal; Notable for the following:    RBC 3.31 (*)    Hemoglobin 9.7 (*)    HCT 30.2 (*)    RDW 18.0 (*)  Platelets 99 (*)    All other components within normal limits  D-DIMER, QUANTITATIVE (NOT AT [facility]) - Abnormal; Notable for the following:    D-Dimer, Quant 3.60 (*)    All other components within normal limits  I-STAT TROPOININ, ED - Abnormal; Notable for the following:    Troponin i, poc 0.16 (*)    All other components within normal limits  T-HELPER CELLS (CD4) COUNT (NOT AT [facility])  GENOSURE INTEGRASE HIV EDI  HIV-1 RNA ULTRAQUANT REFLEX TO GENTYP+    EKG  EKG Interpretation  Date/Time:  Friday May 12 2016 07:48:00 EDT Ventricular Rate:  113 PR Interval:    QRS Duration: 97 QT Interval:  358 QTC Calculation: 491 R Axis:   50 Text Interpretation:  Sinus tachycardia LAE, consider biatrial enlargement Left ventricular hypertrophy Prolonged QT interval since last tracing no significant change Confirmed by Miller Limehouse  MD, Gable Odonohue ([id number]) on 05/12/2016 8:16:39 AM       Radiology Dg Chest 2 View  Result Date: 05/12/2016 CLINICAL DATA:  Shortness of breath and chest pain EXAM: CHEST  2 VIEW COMPARISON:  04/27/2016 FINDINGS: Cardiac shadow remains enlarged. Mild increased vascular congestion is noted without significant edema or effusion. No focal infiltrate is identified. No bony abnormality is seen. IMPRESSION: Changes consistent with mild central vascular congestion. Electronically Signed   By: Inez Catalina M.D.   On: 05/12/2016 08:11    Procedures Procedures (including critical care time)  Medications Ordered in ED Medications  hydrALAZINE (APRESOLINE) tablet 50 mg (50 mg Oral Given 05/12/16 0831)  furosemide (LASIX) injection 80 mg (80 mg Intravenous Given 05/12/16 1030)  iopamidol (ISOVUE-370) 76 % injection  (100 mLs  Contrast Given 05/12/16 1500)     Initial Impression / Assessment and Plan / ED Course  I have reviewed the triage vital signs and the nursing notes.  Pertinent labs & imaging results that were available during my care of the patient were reviewed by me and considered in my medical decision making (see chart for details).  Clinical Course as of May 13 1515  Fri May 12, 2016  0941 Patient presents with shortness of breath. His oxygen saturation drops to 89-90% at times. He feels like he is a little fluid overloaded but when I ask him if his shortness of breath is typical for him or whether this is unusual, he says "I have no idea". He does have a history of DVT in the past. He's not currently on anticoagulants. I feel given that, it's necessary to check a CT to assess for PE.  [MB]  1127 Patient is refusing CT angio. He says that he doesn't want to get contrast because they hurt his kidneys the first time. I explained to him that he starting on dialysis with a would not alter his kidney function but he is still refusing. I will check a d-dimer.  [MB]    Clinical Course User Index [MB] Malvin Johns, MD    Patient presents with pain controlled hypertension and shortness of breath. Is also tachycardic. On review of records he's been tachycardic and hypertensive on prior visits as well. His initial blood pressure was 201/133. He was given dose of hydralazine and it has improved to 169/116. He still feels short of breath. His chest x-ray shows some vascular congestion but no overt edema. His oxygen saturations dropped to about 90% off oxygen but he requests to stay on oxygen as it symptomatically makes him feel better. He does have a history of DVT  but currently he is refusing a CTA of the chest. I did order a VQ scan. This is still pending. He doesn't have ischemic changes on EKG. He denies any chest pain. His troponin is mildly bumped but I suspect it's related to his underlying renal  disease. I have consulted family medicine who is on-call for unassigned to admit the patient.  Final Clinical Impressions(s) / ED Diagnoses   Final diagnoses:  Hypertensive urgency  Shortness of breath  Dyspnea    New Prescriptions New Prescriptions   No medications on file     Malvin Johns, MD 05/12/16 1517

## 2016-05-12 NOTE — ED Notes (Signed)
Patient transported to CT 

## 2016-05-12 NOTE — ED Notes (Signed)
Gave pt snack and turned off oxygen to see how pt does off O2

## 2016-05-12 NOTE — ED Notes (Signed)
Pt was off O2 for about 5 mins and asked to be put back on O2 because he felt short of breath.

## 2016-05-12 NOTE — Progress Notes (Signed)
CTA of the chest did not show PE, but did show a mild to moderate-sized pericardial effusion. Likely related to volume overload. Discussed with cardiologist on call, who agreed that we should get an ECHO after he receives HD to monitor the size of the effusion.  ECHO ordered.  Hyman Bible, MD

## 2016-05-12 NOTE — Progress Notes (Signed)
Patient arrives to [facility] at 1700. Pt oriented to unit. Vitals remain on the same trend, htn and tachycardic. Pt given overdue dose of hydralazine. [facility] at bedside aware of HTN, and recent dose of hydralazine.    Continuous pulse ox requested for patient, pt requests to use o2 via nasal canula for comfort.    Pt's fiance is at bedside. Pt is comfortable. Denies complaints other than hunger.

## 2016-05-12 NOTE — ED Notes (Signed)
Patient transported to X-ray 

## 2016-05-12 NOTE — H&P (Signed)
[facility] Admission History and Physical Service Pager: [phone]  Patient name: Jim Wyatt Medical record number: [id number] Date of birth: 12-Sep-1980 Age: 36 y.o. Gender: male  Primary Care Provider: [name], MD Consultants: None  Code Status: Full code  Chief Complaint: Shortness of breath  Assessment and Plan: Jim Wyatt is a 36 y.o. male presenting with one day of worsening shortness of breath with changes in vision. PMH is significant for poorly controlled hypertension, HIV, end-stage renal disease on dialysis (T/TH/S) and history of DVT x 1 in 2015 (not on any anti-coagulation).  Shortness of breath with chest pain: Reports chronic shortness of breath and does not require oxygen at home, but does have history of asthma. Denies cough and has not required increasing number of pillows with sleep. Does feel like SOB is worse with exertion. On 2L in ED, but when O2 was turned off, satting appropriately on RA. Shortness of breath is most likely caused by volume overload in the setting of HD sessions that were stopped early x 2 due to headache. CXR read as vascular congestion, and lung exam revealed bibasilar crackles and rhonchi in L mid-lung. No lower extremity edema. Tachycardic to 113 and centralized chest pressure that feels like "someone is pushing on his chest". Not worse with exertion. Troponin elevated to 0.16 (could be demand ischemia), but EKG without signs of ischemia/infarct. Only risk factor for CAD is hypertension, making ACS unlikely. In patient with AIDS with recent CD4 count of 4, there is concern for infectious etiology, however patient denies sputum production, fever or chills. CXR without focal infiltrate. Also with history of unprovoked bilateral DVT in tachycardic patient with CP with D-dimer elevated to 3.60, there is concern for PE. Wells score is a 3.  - Admit to inpatient with telemetry under attending [place] - CTA  chest to r/o PE - trend troponins - AM EKG - CD4/HIV RNA quant - continue home azithromycin and dapsone for HIV opportunistic infection prophylaxis - duonebs q6hrs PRN - cardiac monitoring and continuous pulse ox  Headache and vision changes:  Reports headaches about 3 times a week coinciding with his T/TH/S dialysis sessions. Typically does not have vision changes, however did note feeling "hazy" yesterday at dialysis.  Does have a history of seizure disorder having had 2 seizures in the past with the last one in January and takes Keppra daily.  Denies any auras associated with headaches or tearing.  Nephrologist noted that these headaches are likely associated with removal of fluid and acute changes in blood pressure associated with the dialysis. Pt had no focal neurological deficits on neuro exam. States that he is unable to read signs on the wall at the moment. HTN in the setting of volume overload is also likely contributing. Given his CD4 count of 4, also need to think about opportunistic infections such as toxoplasmosis, although Pt states that he takes Dapsone as prescribed and had a recent CT head 05/09/16 that was normal. Pt states he saw an eye doctor last year, who told him he has "changes caused by high blood pressure".  - visual acuity exam - tylenol PRN headache - neuro checks q4hrs - Given that these headaches and vision changes are related to HD, will hold off on imaging to see if these symptoms improve after HD. Consider imaging if no improvement. - consider neuro vs ophthalmology consult   Renovascular hypertension with hypertensive urgency: Blood pressure elevated to 176/118 and tachycardic to 117 with changes  in vision. Nephrologist stated that patient is not being dialyzed enough. He had reportedly been leaving dialysis about one hour early for the past several months. States that he is now at a unit that he likes and has not been leaving early.  Takes losartan 100 mg daily,  hydralazine 100 mg 3 times a day and apparently takes cartia xt, but this is not on his medication list.  - Continue hydralazine 100 mg daily and losartan 100 mg daily - hydralazine 5mg  IV PRN for SBP >180 or DBP >110 - continuous cardiac monitoring  End-stage renal disease: Receives dialysis on a T/TH/S, but had to stop early during the last 2 HD sessions because he developed a headache. This is not unusual and states that he gets a headache when they pull off fluid too fast. Creatinine is 10.69 and BMP is otherwise normal. Seen by Nephrology in ED, will try to get him to HD today, otherwise tomorrow. Has a living related donor and is interested in kidney transplant.  - nephrology consulted;appreciate assistance - AM renal function panel  HIV disease/advanced AIDS: Last CD4 count on 1/18 was 4 and viral load to 455,000.  Last ID visit, on 2/5 there were plans to draw labs for genotype and possibility of readjusting his medication. Was able to verbalize his medication regimen perfectly. Takes abicavir 300mg  BID, azithromycin 600mg  BID once per week (on mondays), dapsone 100mg  daily and tivicay 50mg .  Episode of shortness of breath could represent an AIDS defining illness. Denies any cough, but does have some bibasilar crackles and L middle lobe rhonchi.  - continue abicavir 300mg  BID, dapsone 100mg  and tivicar 50mg  daily - repeat CD4/RNA quant - f/u genosure integrase and RNA ultraquant reflex to genotype - ID consulted;appreciate assistance  History of DVT: Bilateral 2015 9/15. Due to immobility. Completed course of anticoagulation. No LE tenderness, erythema or palpable cords. Given clinical picture suggestive of PE and elevated d-dimer, ordered CTA chest.  - f/u CTA chest to r/o PE - avoiding SCDs, will get subcutaneous heparin for prophylaxis  Seizure disorder: Last seizure in January. Has only had 2 seizures in his lifetime. Taking keppra every day.  - continue keppra 500mg  daily and an  additional 250mg  on dialysis days (T/TH/S)  OSA: Supposed to have sleep study at the end of this month. History of obesity, but has lost >50 pounds d/t renal disease and HIV/advanced AIDS. Does not currently use CPAP.  - f/u sleep study as an outpatient  Asthma: Takes albuterol. Has reportedly been using it around four times daily recently for shortness of breath. Denies tobacco.  - duonebs PRN - consider controller med - outpatient PFTs  FEN/GI: renal diet with fluid restriction Prophylaxis: subcutaneous heparin  Disposition: admit to telemetry under attending Eniola  History of Present Illness:  Jim Wyatt is a 36 y.o. male presenting with shortness of breath, chest discomfort and vision changes with hypertension. Patient went to work last night after dialysis and noted vision started going blurry without headache.  Vision progressively getting worse without chest pain, but did have shortness of breath.  Does endorse chronic shortness of breath worse with ambulation.  Came home from work and went to sleep briefly and woke up gasping for air. Called 911 and transported to [facility]. Currently denies cough, headache, fevers, chills, nausea, vomiting or diarrhea. Does endorse some centralized chest pain without radiation. Pain is constant and has been there for about a day now. Nothing makes the pain worse or better.  Additionally notes that he is unable to read messages on the wall. Denies headache at the moment.   In ED, he had CXR that showed vascular congestion and troponin elevated to 0.16.  EKG showed LAE with LVH and prolonged QTC to 491 and showed no acute ST changes. No electrolyte abnormalities. Blood pressure elevated to 176/118 and tachycardic to 115 and satting well on room air. D-dimer elevated to 3.6.    Review Of Systems: Per HPI   ROS  Patient Active Problem List   Diagnosis Date Noted  . OSA (obstructive sleep apnea) 04/19/2016  . Hospital discharge follow-up 04/19/2016   . Depression 03/24/2016  . Breast lump or mass   . Rectal bleeding   . GI bleed 03/15/2016  . Hyperkalemia   . New onset seizure ([place]) 03/13/2016  . Symptomatic anemia 02/01/2016  . Dysuria 01/18/2016  . Hematochezia 01/09/2016  . Postoperative anemia due to acute blood loss 01/09/2016  . Seizure disorder ([place]) 12/29/2015  . Intussusception ([place]) 12/28/2015  . Hypertension 12/28/2015  . Otitis externa of both ears 11/21/2015  . AIDS (acquired immunodeficiency syndrome), CD4 <=200 ([place])   . Meningitis   . Cephalalgia   . Blurry vision, bilateral   . Headache   . Hypertensive urgency   . New onset of headache in immunocompromised patient ([place]) 07/06/2015  . ESRD on dialysis ([place]) 04/27/2015  . CAP (community acquired pneumonia) 02/25/2015  . ESRD (end stage renal disease) ([place]) 02/16/2015  . History of DVT (deep vein thrombosis) 01/12/2015  . Candidal esophagitis ([place])   . Constipation   . Nausea without vomiting 12/20/2014  . Abscess of right thigh 12/19/2014  . Chronic kidney disease (CKD), stage IV (severe) ([place]) 12/19/2014  . 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 Anemia due to end stage renal disease ([place]) 12/19/2014  . Hyponatremia 12/19/2014  . Community acquired pneumonia 07/30/2014  . Anemia of chronic disease 07/30/2014  . Acute on chronic renal failure ([place])   . 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 Human immunodeficiency virus (HIV) disease ([place]) 12/11/2013  . CMV (cytomegalovirus infection) ([place]) 12/07/2013  . Protein-calorie malnutrition, severe ([place]) 12/06/2013  . 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 DVT (deep venous thrombosis) ([place]) 11/24/2013  . Asthma, chronic 11/24/2013  . Acquired immune deficiency syndrome ([place]) 11/14/2013  . AKI (acute kidney injury) ([place]) 11/11/2013  . Elevated liver enzymes 11/09/2013  . History of shingles 11/09/2013  . Obesity 11/09/2013  . Abdominal pain 11/07/2013   . Asthma     Past Medical History: Past Medical History:  Diagnosis Date  . Asthma   . CAP (community acquired pneumonia) 10/2013  . Cellulitis 11/2013   LLE  . DVT (deep venous thrombosis) ([place]) 10/2013   BLE  . ESRD on hemodialysis ([place]) 01/2015   biopsy proven FSGS with ATN on renal bx from Sept 2015. Went on HD Dec 2016.   [name] Fever   . Headache   . HIV infection ([place]) dx'd 10/2013  . Hypertension   . Leg pain   . Multiple environmental allergies    "trees, dogs, cats"  . Multiple food allergies   . Shingles < 2010  . 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 AV FISTULA PLACEMENT Left 03/03/2015   Procedure: Creation of Left Arm RADIOCEPHALIC ARTERIOVENOUS FISTULA ;  Surgeon: Mal Misty, MD;  Location: [facility];  Service: Vascular;  Laterality: Left;  . COLONOSCOPY  N/A 03/18/2016   Procedure: COLONOSCOPY;  Surgeon: Manus Gunning, MD;  [facility] ENDOSCOPY;  Service: Gastroenterology;  Laterality: N/A;  . FEMUR FRACTURE SURGERY Right 09/2001  . FRACTURE SURGERY    . I&D EXTREMITY Right 12/18/2014   Procedure: IRRIGATION AND DEBRIDEMENT RIGHT LOWER EXTREMITY;  Surgeon: Ralene Ok, MD;  Location: [facility];  Service: General;  Laterality: Right;  . INSERTION OF DIALYSIS CATHETER  2016  . LAPAROTOMY N/A 12/28/2015   Procedure: EXPLORATORY LAPAROTOMY BOWEL RESECTION ILEOCECECTOMY;  Surgeon: Clovis Riley, MD;  Location: [facility];  Service: General;  Laterality: N/A;    Social History: Social History  Substance Use Topics  . Smoking status: Never Smoker  . Smokeless tobacco: Never Used  . Alcohol use No     Comment: I stopped drinking along time ago "   No drugs, alcohol, tobacco  Family History: Family History  Problem Relation Age of Onset  . Hypertension Mother   . Diabetes Maternal Grandmother   . Kidney disease Maternal Grandmother     Allergies and  Medications: Allergies  Allergen Reactions  . Bactrim [Sulfamethoxazole-Trimethoprim] Other (See Comments)    Renal failure  . Reglan [Metoclopramide] Other (See Comments)    Causes ticks/ jerks  . Vancomycin Other (See Comments)    Patient states vancomycin caused kidney injury  . Amlodipine Palpitations and Other (See Comments)    fatigue  . Augmentin [Amoxicillin-Pot Clavulanate] Hives   No current facility-administered medications on file prior to encounter.    Current Outpatient Prescriptions on File Prior to Encounter  Medication Sig Dispense Refill  . abacavir (ZIAGEN) 300 MG tablet Take 2 tablets (600 mg total) by mouth daily with supper. 60 tablet 11  . acetaminophen (TYLENOL) 500 MG tablet Take 1,000 mg by mouth every 6 (six) hours as needed (pain).    [name] albuterol (PROVENTIL HFA;VENTOLIN HFA) 108 (90 Base) MCG/ACT inhaler Inhale 2 puffs into the lungs every 6 (six) hours as needed for wheezing or shortness of breath. 3 Inhaler 3  . Ascorbic Acid (VITAMIN C) 250 MG CHEW Chew 500 mg by mouth daily.     [name] azithromycin (ZITHROMAX) 600 MG tablet Take 1,200 mg by mouth once a week.     . dapsone 100 MG tablet Take 1 tablet (100 mg total) by mouth daily. 30 tablet 11  . darunavir-cobicistat (PREZCOBIX) 800-150 MG tablet Take 1 tablet by mouth every evening. Swallow whole. Do NOT crush, break or chew tablets. Take with food. 30 tablet 11  . dolutegravir (TIVICAY) 50 MG tablet Take 1 tablet (50 mg total) by mouth every evening. 30 tablet 11  . hydrALAZINE (APRESOLINE) 100 MG tablet Take 0.5 tablets (50 mg total) by mouth 3 (three) times daily. 6:30am, 2:30pm, 11pm - for high blood pressure (Patient taking differently: Take 100 mg by mouth 3 (three) times daily. 6:30am, 2:30pm, 11pm - for high blood pressure ) 60 tablet 1  . levETIRAcetam (KEPPRA) 250 MG tablet Take 1 tablet (250 mg) by mouth on Tuesday, Thursday, Saturday at 7pm (Patient taking differently: Take 250-500 mg by mouth See  admin instructions. takes extra 250mg  on dialysis days Tu, Thurs, Sunday) 60 tablet 11  . levETIRAcetam (KEPPRA) 500 MG tablet Take 1 tablet (500 mg total) by mouth daily. 60 tablet 11  . losartan (COZAAR) 100 MG tablet Take 100 mg by mouth at bedtime.    . ondansetron (ZOFRAN ODT) 8 MG disintegrating tablet Take 1 tablet (8 mg total) by mouth every 8 (eight)  hours as needed for nausea or vomiting. (Patient not taking: Reported on 05/12/2016) 30 tablet 11  . triamcinolone (KENALOG) 0.025 % ointment Apply 1 application topically 2 (two) times daily. Do not apply to face (Patient not taking: Reported on 05/09/2016) 15 g 0  . venlafaxine XR (EFFEXOR-XR) 150 MG 24 hr capsule TAKE 1 CAPSULE(150 MG) BY MOUTH DAILY (Patient not taking: Reported on 05/12/2016) 30 capsule 0    Objective: BP (!) 169/116   Pulse (!) 117   Temp 98.5 F (36.9 C) (Oral)   Resp 13   Ht 5\' 7"  (1.702 m)   Wt 152 lb 1.9 oz (69 kg)   SpO2 98%   BMI 23.82 kg/m  Exam: General: 36 year old male sitting up in bed with nasal cannula during comfortable in no acute distress Eyes: EOMI, PERRL, anicteric, non-injected ENTM: no nasal drainage, clear oropharynx, mildly tachy MM Neck: supple, no LAD Cardiovascular: tachycardic with regular rhythm, no MRG Respiratory: no increased WOB,  Gastrointestinal: well-healed mid-line scar, soft, mildly tender RUQ without guarding, no distension or peritoneal signs, +BS MSK: no gross deformities, no edema Derm: warm and dry Neuro: CN2-12 WNL, no changes in sensation, strength 5/5 throughout, no dysmetria Psych: Normal mood and affect  Labs and Imaging: CBC BMET   Recent Labs Lab 05/12/16 0747  WBC 8.6  HGB 9.7*  HCT 30.2*  PLT 99*    Recent Labs Lab 05/12/16 0747  NA 139  K 4.0  CL 98*  CO2 23  BUN 53*  CREATININE 10.76*  GLUCOSE 88  CALCIUM 9.4      Eloise Levels, MD 05/12/2016, 1:09 PM PGY-1, [facility] pager: [phone], text pages  welcome  FPTS Upper-Level Resident Addendum  I have independently interviewed and examined the patient. I have discussed the above with the original author and agree with their documentation. My edits for correction/addition/clarification are in blue. Please see also any attending notes.   Hyman Bible, MD PGY-2, [facility] Service pager: [phone] (text pages welcome through [place])

## 2016-05-13 ENCOUNTER — Inpatient Hospital Stay (HOSPITAL_COMMUNITY): Payer: Medicare Other

## 2016-05-13 ENCOUNTER — Other Ambulatory Visit (HOSPITAL_COMMUNITY): Payer: Self-pay

## 2016-05-13 DIAGNOSIS — I319 Disease of pericardium, unspecified: Secondary | ICD-10-CM

## 2016-05-13 DIAGNOSIS — Z79899 Other long term (current) drug therapy: Secondary | ICD-10-CM

## 2016-05-13 LAB — ECHOCARDIOGRAM COMPLETE
FS: 35 % (ref 28–44)
Height: 67 in
IVS/LV PW RATIO, ED: 0.92
LA ID, A-P, ES: 44 mm
LA diam end sys: 44 mm
LA diam index: 2.38 cm/m2
LA vol A4C: 87.5 ml
LA vol index: 55.7 mL/m2
LA vol: 103 mL
LV PW d: 13.6 mm — AB (ref 0.6–1.1)
LVOT SV: 126 mL
LVOT VTI: 25.7 cm
LVOT area: 4.91 cm2
LVOT diameter: 25 mm
LVOT peak grad rest: 9 mmHg
LVOT peak vel: 151 cm/s
Lateral S' vel: 21.9 cm/s
MV pk A vel: 161 m/s
MV pk E vel: 57.7 m/s
TAPSE: 24.4 mm
Weight: 2582.03 oz

## 2016-05-13 LAB — RENAL FUNCTION PANEL
Albumin: 2.8 g/dL — ABNORMAL LOW (ref 3.5–5.0)
Anion gap: 14 (ref 5–15)
BUN: 43 mg/dL — ABNORMAL HIGH (ref 6–20)
CO2: 25 mmol/L (ref 22–32)
Calcium: 8.9 mg/dL (ref 8.9–10.3)
Chloride: 96 mmol/L — ABNORMAL LOW (ref 101–111)
Creatinine, Ser: 8.78 mg/dL — ABNORMAL HIGH (ref 0.61–1.24)
GFR calc Af Amer: 8 mL/min — ABNORMAL LOW (ref >60)
GFR calc non Af Amer: 7 mL/min — ABNORMAL LOW (ref >60)
Glucose, Bld: 96 mg/dL (ref 65–99)
Phosphorus: 4.3 mg/dL (ref 2.5–4.6)
Potassium: 3.2 mmol/L — ABNORMAL LOW (ref 3.5–5.1)
Sodium: 135 mmol/L (ref 135–145)

## 2016-05-13 LAB — HEPATIC FUNCTION PANEL
ALT: 20 U/L (ref 17–63)
AST: 23 U/L (ref 15–41)
Albumin: 2.8 g/dL — ABNORMAL LOW (ref 3.5–5.0)
Alkaline Phosphatase: 41 U/L (ref 38–126)
Bilirubin, Direct: 0.2 mg/dL (ref 0.1–0.5)
Indirect Bilirubin: 0.6 mg/dL (ref 0.3–0.9)
Total Bilirubin: 0.8 mg/dL (ref 0.3–1.2)
Total Protein: 6.8 g/dL (ref 6.5–8.1)

## 2016-05-13 LAB — CBC
HCT: 31.1 % — ABNORMAL LOW (ref 39.0–52.0)
Hemoglobin: 10.1 g/dL — ABNORMAL LOW (ref 13.0–17.0)
MCH: 29.4 pg (ref 26.0–34.0)
MCHC: 32.5 g/dL (ref 30.0–36.0)
MCV: 90.4 fL (ref 78.0–100.0)
Platelets: 100 10*3/uL — ABNORMAL LOW (ref 150–400)
RBC: 3.44 MIL/uL — ABNORMAL LOW (ref 4.22–5.81)
RDW: 18.7 % — ABNORMAL HIGH (ref 11.5–15.5)
WBC: 6.5 10*3/uL (ref 4.0–10.5)

## 2016-05-13 LAB — TROPONIN I: Troponin I: 0.15 ng/mL (ref <0.03)

## 2016-05-13 MED ORDER — METOPROLOL TARTRATE 12.5 MG HALF TABLET
12.5000 mg | ORAL_TABLET | Freq: Two times a day (BID) | ORAL | Status: DC
  Administered 2016-05-13 – 2016-05-14 (×3): 12.5 mg via ORAL
  Filled 2016-05-13 (×2): qty 1

## 2016-05-13 MED ORDER — LIDOCAINE HCL (PF) 1 % IJ SOLN: 5.0000 mL | INTRAMUSCULAR | Status: DC | PRN

## 2016-05-13 MED ORDER — ACETAMINOPHEN 325 MG PO TABS
ORAL_TABLET | ORAL | Status: AC
Start: 2016-05-13 — End: 2016-05-13
  Administered 2016-05-13: 650 mg via ORAL
  Filled 2016-05-13: qty 2

## 2016-05-13 MED ORDER — LIDOCAINE-PRILOCAINE 2.5-2.5 % EX CREA
1.0000 | TOPICAL_CREAM | CUTANEOUS | Status: DC | PRN
Start: 2016-05-13 — End: 2016-05-13
  Filled 2016-05-13: qty 5

## 2016-05-13 MED ORDER — SODIUM CHLORIDE 0.9 % IV SOLN
100.0000 mL | INTRAVENOUS | Status: DC | PRN
Start: 2016-05-13 — End: 2016-05-13

## 2016-05-13 MED ORDER — PROMETHAZINE HCL 25 MG/ML IJ SOLN
12.5000 mg | Freq: Every day | INTRAMUSCULAR | Status: DC | PRN
Start: 2016-05-13 — End: 2016-05-22
  Administered 2016-05-13 – 2016-05-20 (×8): 12.5 mg via INTRAVENOUS
  Filled 2016-05-13 (×8): qty 1

## 2016-05-13 MED ORDER — NIFEDIPINE ER 60 MG PO TB24
60.0000 mg | ORAL_TABLET | Freq: Every day | ORAL | Status: DC
  Administered 2016-05-13 – 2016-05-17 (×5): 60 mg via ORAL
  Filled 2016-05-13 (×6): qty 1

## 2016-05-13 MED ORDER — SODIUM CHLORIDE 0.9 % IV SOLN
125.0000 mg | INTRAVENOUS | Status: DC
  Administered 2016-05-18: 125 mg via INTRAVENOUS
  Filled 2016-05-13 (×3): qty 10

## 2016-05-13 MED ORDER — HEPARIN SODIUM (PORCINE) 1000 UNIT/ML DIALYSIS
1000.0000 [IU] | INTRAMUSCULAR | Status: DC | PRN
Start: 2016-05-13 — End: 2016-05-13
  Filled 2016-05-13: qty 1

## 2016-05-13 MED ORDER — VITAMIN C 500 MG PO TABS
500.0000 mg | ORAL_TABLET | Freq: Every day | ORAL | Status: DC
  Administered 2016-05-16 – 2016-05-20 (×2): 500 mg via ORAL
  Filled 2016-05-13 (×9): qty 1

## 2016-05-13 MED ORDER — TRAMADOL HCL 50 MG PO TABS
50.0000 mg | ORAL_TABLET | Freq: Four times a day (QID) | ORAL | Status: DC | PRN
  Administered 2016-05-13 – 2016-05-15 (×5): 50 mg via ORAL
  Filled 2016-05-13 (×5): qty 1

## 2016-05-13 MED ORDER — HYDROMORPHONE HCL 1 MG/ML IJ SOLN
1.0000 mg | Freq: Once | INTRAMUSCULAR | Status: AC
  Administered 2016-05-13: 1 mg via INTRAVENOUS

## 2016-05-13 MED ORDER — HYDROMORPHONE HCL 1 MG/ML IJ SOLN
INTRAMUSCULAR | Status: AC
  Administered 2016-05-13: 1 mg via INTRAVENOUS
  Filled 2016-05-13: qty 1

## 2016-05-13 MED ORDER — ALTEPLASE 2 MG IJ SOLR: 2.0000 mg | Freq: Once | INTRAMUSCULAR | Status: DC | PRN

## 2016-05-13 MED ORDER — DARBEPOETIN ALFA 200 MCG/0.4ML IJ SOSY
200.0000 ug | PREFILLED_SYRINGE | INTRAMUSCULAR | Status: DC
  Administered 2016-05-20: 200 ug via INTRAVENOUS
  Filled 2016-05-13: qty 0.4

## 2016-05-13 MED ORDER — ACETAMINOPHEN 325 MG PO TABS
ORAL_TABLET | ORAL | Status: AC
  Filled 2016-05-13: qty 1

## 2016-05-13 MED ORDER — ONDANSETRON HCL 4 MG/2ML IJ SOLN: 4.0000 mg | Freq: Once | INTRAMUSCULAR | Status: DC

## 2016-05-13 MED ORDER — PENTAFLUOROPROP-TETRAFLUOROETH EX AERO: 1.0000 "application " | INHALATION_SPRAY | CUTANEOUS | Status: DC | PRN

## 2016-05-13 MED ORDER — DIPHENHYDRAMINE HCL 25 MG PO CAPS
25.0000 mg | ORAL_CAPSULE | Freq: Once | ORAL | Status: AC
Start: 2016-05-13 — End: 2016-05-13
  Administered 2016-05-13: 25 mg via ORAL
  Filled 2016-05-13: qty 1

## 2016-05-13 NOTE — Consult Note (Signed)
[facility] for Infectious Disease       Reason for Consult: HIV    Referring Physician: Dr. Gwendlyn Deutscher  Active Problems:   Shortness of breath   Dyspnea   Hypertensive emergency   Pericardial effusion   Tachycardia   HIV disease ([place])   . abacavir  600 mg Oral Q supper  . acetaminophen      . Chlorhexidine Gluconate Cloth  6 each Topical Q0600  . dapsone  100 mg Oral Daily  . [START ON 05/20/2016] darbepoetin (ARANESP) injection - DIALYSIS  200 mcg Intravenous Q Sat-HD  . darunavir-cobicistat  1 tablet Oral QPM  . dolutegravir  50 mg Oral QPM  . doxercalciferol  6 mcg Intravenous Q T,Th,Sa-HD  . [START ON 05/18/2016] ferric gluconate (FERRLECIT/NULECIT) IV  125 mg Intravenous Q Thu-HD  . heparin  5,000 Units Subcutaneous Q8H  . hydrALAZINE  50 mg Oral TID  . levETIRAcetam  250 mg Oral Once per day on Tue Thu Sat  . levETIRAcetam  500 mg Oral Daily  . losartan  100 mg Oral QHS  . multivitamin  1 tablet Oral QHS  . mupirocin ointment  1 application Nasal BID  . NIFEdipine  60 mg Oral Daily  . sevelamer carbonate  1.6 g Oral TID WC  . sodium chloride flush  3 mL Intravenous Q12H  . vitamin C  500 mg Oral Daily    Recommendations: Continue ARVs Appropriate labs sent  We will arrange follow up in our clinic  Assessment: He has poorly controlled HIV and poor insight into his situation.  Also not compliant with dialysis.     Antibiotics: Prezcobix, abacavir, Tivicay  HPI: Jim Wyatt is a 36 y.o. male with HIV, ESRD on dialysis, history of poor compliance and difficult to get managed in clinic who is here with volume overload with recent poor compliance with dialysis.  Says he is taking his ARVs despite his viral load 'going up', though he has not had labs since December despite multiple attempts to get him in.  Has not had a suppressed viral load in recent history.   History reviewed as above and previous notes of difficulty getting him engaged in his care.  No  associated sob, cough, sputum. Works 3rd shift.  Previous CD4s reviewed and has not been higher than 20.  Has not been taking Dapsone.    Review of Systems:  Constitutional: negative for fevers and chills Gastrointestinal: negative for diarrhea All other systems reviewed and are negative    Past Medical History:  Diagnosis Date  . Asthma   . CAP (community acquired pneumonia) 10/2013  . Cellulitis 11/2013   LLE  . DVT (deep venous thrombosis) ([place]) 10/2013   BLE  . ESRD on hemodialysis ([employer]) 01/2015   biopsy proven FSGS with ATN on renal bx from Sept 2015. Went on HD Dec 2016.   [name] Fever   . Headache   . HIV infection ([place]) dx'd 10/2013  . Hypertension   . Leg pain   . Multiple environmental allergies    "trees, dogs, cats"  . Multiple food allergies   . Shingles < 2010  . 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 Smoking status: Never Smoker  . Smokeless tobacco: Never Used  . Alcohol use No     Comment: I stopped drinking along time ago "    Family History  Problem Relation Age of Onset  . Hypertension Mother   .  Diabetes Maternal Grandmother   . Kidney disease Maternal Grandmother     Allergies  Allergen Reactions  . Bactrim [Sulfamethoxazole-Trimethoprim] Other (See Comments)    Renal failure  . Reglan [Metoclopramide] Other (See Comments)    Causes ticks/ jerks  . Vancomycin Other (See Comments)    Patient states vancomycin caused kidney injury  . Amlodipine Palpitations and Other (See Comments)    fatigue  . Augmentin [Amoxicillin-Pot Clavulanate] Hives    Physical Exam: Constitutional: in no apparent distress and alert  Vitals:   05/13/16 0800 05/13/16 0809  BP: (!) 157/92 (!) 179/102  Pulse: (!) 113 (!) 111  Resp: (!) 28 16  Temp:  97.9 F (36.6 C)   EYES: anicteric ENMT: no thrush Cardiovascular: Cor RRR Respiratory: CTA B; normal respiratory effort, no crackles noted GI: Bowel sounds are  normal Musculoskeletal: no pedal edema noted Skin: negatives: no rash  Lab Results  Component Value Date   WBC 6.5 05/13/2016   HGB 10.1 (L) 05/13/2016   HCT 31.1 (L) 05/13/2016   MCV 90.4 05/13/2016   PLT 100 (L) 05/13/2016    Lab Results  Component Value Date   CREATININE 8.78 (H) 05/13/2016   BUN 43 (H) 05/13/2016   NA 135 05/13/2016   K 3.2 (L) 05/13/2016   CL 96 (L) 05/13/2016   CO2 25 05/13/2016    Lab Results  Component Value Date   ALT 20 05/13/2016   AST 23 05/13/2016   ALKPHOS 41 05/13/2016     Microbiology: Recent Results (from the past 240 hour(s))  MRSA PCR Screening     Status: Abnormal   Collection Time: 05/12/16  6:13 PM  Result Value Ref Range Status   MRSA by PCR POSITIVE (A) NEGATIVE Final    Comment:        The GeneXpert MRSA Assay (FDA approved for NASAL specimens only), is one component of a comprehensive MRSA colonization surveillance program. It is not intended to diagnose MRSA infection nor to guide or monitor treatment for MRSA infections. RESULT CALLED TO, READ BACK BY AND VERIFIED WITH: D HART RN 2010 05/12/16 A Deno Etienne, [facility] Infectious Disease [facility] [email or web address] [phone] pager  [phone] cell 05/13/2016, 10:26 AM

## 2016-05-13 NOTE — Progress Notes (Signed)
Spoke with provider regarding patients continued headache rated at an 8.  New orders received.

## 2016-05-13 NOTE — Progress Notes (Signed)
Received patient back from dialysis.  Patient c/o headache which he gets with dialysis treatment, says nothing helps the headache just has to eventually wear off.  BP continues to be elevated after dialysis.

## 2016-05-13 NOTE — Progress Notes (Signed)
Patient did sleep and voiced some improvement in headache after receiving Tramadol and Benadryl.  BP remains elevated, new medications have been added this shift.  Iv Hydralazine given without improvement.  HR noted to be from 105 to 120's.  RN has spoken with provider regarding above.

## 2016-05-13 NOTE — Progress Notes (Signed)
  Echocardiogram 2D Echocardiogram has been performed.  Jim Wyatt 05/13/2016, 3:56 PM

## 2016-05-13 NOTE — Progress Notes (Signed)
[facility] Progress Note   Dialysis Orders: [facility] 3h 47mins BFR 450/800 2/2 EDW 65.5kg  -Hectorol 6 mcg IV q HD -Mircera 200 mcg IV 2 weeks (last dose 3/10)  -Venofer 50 mg IV q week (to start 3/22)  Assessment/Plan: 1.  Dyspnea/Chest pain - per primary CT angio neg 2.  ESRD -  TTS -has had 3 hours today - signing off - adamant - goal was 4 L -he got 3 off - net might only be 2.5- last got to edw 3/6 - even ran full time K 3.2 4 K  3.  Hypertension/volume  - BP elevated reports intolerance with multiple BP meds - now on hydrazlyine/ volume up/pericardial effusion and left pleural effusion 4.  Anemia  - Hgb 10.1- last Mircera 200 3/10- due to for redose 3/24/weekly Fe 5.  Metabolic bone disease -  Cont VDRA/Renvela binder 6.  Nutrition - Renal diet/vitamins 7. HIV/AIDS - on ART per primary 8. Seizure disorder - on Keppra  9. Headaches - LONG standing worse after about 3.5 - 3 hours on dialysis - BP notorious high but doubt that is the etiology- given 1 mg dilaudid but not off dialysis- primary addressing 10. Nonadherence to HD - he attributes to headaches but I don't think that is I the only issue  Myriam Jacobson, PA-C [place] [phone] 05/13/2016,8:16 AM  LOS: 1 day   Subjective:   c/o terrible headache  Objective Vitals:   05/13/16 0505 05/13/16 0530 05/13/16 0600 05/13/16 0630  BP: (!) 178/106 (!) 190/100 (!) 199/112 (!) 182/103  Pulse: (!) 108 (!) 108 (!) 114 (!) 112  Resp: 16 16 16 16   Temp:      TempSrc:      SpO2:      Weight:      Height:       Physical Exam General: miserable Heart: tachy red Lungs: limited exam due to moaning and groaning Abdomen: soft nt Extremities: no edema  Additional Objective Labs: Basic Metabolic Panel:  Recent Labs Lab 05/09/16 1336 05/09/16 1350 05/12/16 0747 05/13/16 0523  NA 134* 135 139 135  K 3.8 3.9 4.0 3.2*  CL 95* 94* 98* 96*  CO2 27  --  23 25  GLUCOSE 122* 124* 88  96  BUN 43* 48* 53* 43*  CREATININE 9.77* 10.70* 10.76* 8.78*  CALCIUM 8.5*  --  9.4 8.9  PHOS  --   --   --  4.3   Liver Function Tests:  Recent Labs Lab 05/09/16 1336 05/13/16 0500 05/13/16 0523  AST 44* 23  --   ALT 34 20  --   ALKPHOS 45 41  --   BILITOT 0.8 0.8  --   PROT 7.3 6.8  --   ALBUMIN 2.9* 2.8* 2.8*   No results for input(s): LIPASE, AMYLASE in the last 168 hours. CBC:  Recent Labs Lab 05/09/16 1336 05/09/16 1350 05/12/16 0747 05/13/16 0523  WBC 4.6  --  8.6 6.5  NEUTROABS 2.9  --   --   --   HGB 9.8* 10.9* 9.7* 10.1*  HCT 30.6* 32.0* 30.2* 31.1*  MCV 88.4  --  91.2 90.4  PLT 144*  --  99* 100*   Blood Culture    Component Value Date/Time   SDES BLOOD RIGHT HAND 03/17/2016 1956   SPECREQUEST BOTTLES DRAWN AEROBIC ONLY 10CC 03/17/2016 1956   CULT NO GROWTH 5 DAYS 03/17/2016 1956   REPTSTATUS 03/22/2016 FINAL 03/17/2016 1956  Cardiac Enzymes:  Recent Labs Lab 05/12/16 1757 05/12/16 2251 05/13/16 0523  TROPONINI 0.16* 0.18* 0.15*   CBG: No results for input(s): GLUCAP in the last 168 hours. Iron Studies: No results for input(s): IRON, TIBC, TRANSFERRIN, FERRITIN in the last 72 hours. Lab Results  Component Value Date   INR 0.98 03/17/2016   INR 0.98 02/01/2016   INR 0.98 01/10/2016   Studies/Results: Dg Chest 2 View  Result Date: 05/12/2016 CLINICAL DATA:  Shortness of breath and chest pain EXAM: CHEST  2 VIEW COMPARISON:  04/27/2016 FINDINGS: Cardiac shadow remains enlarged. Mild increased vascular congestion is noted without significant edema or effusion. No focal infiltrate is identified. No bony abnormality is seen. IMPRESSION: Changes consistent with mild central vascular congestion. Electronically Signed   By: Inez Catalina M.D.   On: 05/12/2016 08:11   Ct Angio Chest Pe W Or Wo Contrast  Result Date: 05/12/2016 CLINICAL DATA:  Chest pain and shortness of breath since yesterday. EXAM: CT ANGIOGRAPHY CHEST WITH CONTRAST TECHNIQUE:  Multidetector CT imaging of the chest was performed using the standard protocol during bolus administration of intravenous contrast. Multiplanar CT image reconstructions and MIPs were obtained to evaluate the vascular anatomy. CONTRAST:  180 cc of Isovue 370 COMPARISON:  Chest radiographs dated 05/12/2016. FINDINGS: Cardiovascular: Initial images obtained with 100 cc of Isovue 370 did not demonstrate adequate opacification of the pulmonary arteries. Therefore, repeat images were obtained with an additional 80 cc of Isovue 370. These demonstrate normally opacified pulmonary arteries with no pulmonary arterial filling defects. The heart is enlarged. The pericardial effusion is demonstrated, measuring 1.9 cm in maximum thickness. Mediastinum/Nodes: No enlarged mediastinal, hilar, or axillary lymph nodes. Thyroid gland, trachea, and esophagus demonstrate no significant findings. Lungs/Pleura: Prominent pulmonary vasculature. Small left pleural effusion. Upper Abdomen: No acute abnormality. Musculoskeletal: Minimal thoracic spine degenerative changes. Review of the MIP images confirms the above findings. IMPRESSION: 1. No pulmonary emboli. 2. Small to moderate-sized pericardial effusion. 3. Small left pleural effusion. 4. Cardiomegaly and pulmonary vascular congestion. Electronically Signed   By: Claudie Revering M.D.   On: 05/12/2016 16:03   Medications:  . abacavir  600 mg Oral Q supper  . acetaminophen      . Chlorhexidine Gluconate Cloth  6 each Topical Q0600  . dapsone  100 mg Oral Daily  . darunavir-cobicistat  1 tablet Oral QPM  . dolutegravir  50 mg Oral QPM  . doxercalciferol  6 mcg Intravenous Q T,Th,Sa-HD  . heparin  5,000 Units Subcutaneous Q8H  . hydrALAZINE  50 mg Oral TID  .  HYDROmorphone (DILAUDID) injection  1 mg Intravenous Once  . levETIRAcetam  250 mg Oral Once per day on Tue Thu Sat  . levETIRAcetam  500 mg Oral Daily  . losartan  100 mg Oral QHS  . multivitamin  1 tablet Oral QHS  .  mupirocin ointment  1 application Nasal BID  . sevelamer carbonate  1.6 g Oral TID WC  . sodium chloride flush  3 mL Intravenous Q12H  . vitamin C  500 mg Oral Daily    I have seen and examined this patient and agree with plan and assessment in the above note with renal recommendations/intervention highlighted.  Still with headache but has been ongoing.  Stressed compliance with HD.  Will also add procardia 60 mg daily to help with HA. Broad[place] John A Ethel Veronica,MD 05/13/2016 8:44 AM

## 2016-05-13 NOTE — Progress Notes (Signed)
Family Medicine Teaching Service Daily Progress Note Intern Pager: [phone]  Patient name: Jim Wyatt Medical record number: [id number] Date of birth: 1980/06/04 Age: 35 y.o. Gender: male  Primary Care Provider: [name], MD Consultants: Nephro  Code Status: FULL   Pt Overview and Major Events to Date:  3/16: Admitted to [facility]  3/17: HD   Assessment and Plan: Jim Wyatt is a 35 y.o. male presenting with one day of worsening shortness of breath with changes in vision. PMH is significant for poorly controlled hypertension, HIV, end-stage renal disease on dialysis (T/TH/S) and history of DVT x 1 in 2015 (not on any anti-coagulation).  Shortness of breath with chest pain:  Shortness of breath is most likely caused by volume overload in the setting of HD sessions that were stopped early x 2 due to headache. CXR with vascular congestion. Troponin peaked at  0.18 but downtrended (could be demand ischemia vs. Elevation due to ESRD), but EKG without signs of ischemia/infarct. In patient with AIDS with recent CD4 count of 4, there is concern for infectious etiology, however patient denies sputum production, fever or chills. CXR without focal infiltrate. CTA chest negative for PE but did reveal small to moderate sized pericardial effusion and small left pleural effusion.  -  telemetry and continuous pulse ox  - CD4/HIV RNA quant - continue home azithromycin and dapsone for HIV opportunistic infection prophylaxis - duonebs q6hrs PRN - HD for volume removal  -ECHO   Headache and vision changes:  Reports headaches about 3 times a week coinciding with his T/TH/S dialysis sessions. Typically does not have vision changes with the headaches. Nephrologist noted that these headaches are likely associated with removal of fluid and acute changes in blood pressure associated with the dialysis. [name] HTN in the setting of volume overload is also likely contributing. Given his CD4 count of 4, also  need to think about opportunistic infections such as toxoplasmosis, although Pt states that he takes Dapsone as prescribed and had a recent CT head 05/09/16 that was normal. Pt states he saw an eye doctor last year, who told him he has "changes caused by high blood pressure".  - tylenol PRN headache - neuro checks q4hrs - MRI brain  - consider neuro vs ophthalmology consult   Renovascular hypertension with hypertensive urgency: BP remains significantly elevated. Nephrologist stated that patient is not being dialyzed enough. He had reportedly been leaving dialysis about one hour early for the past several months. Takes losartan 100 mg daily, hydralazine 100 mg 3 times a day and apparently takes cartia xt, but this is not on his medication list.  - Continue hydralazine 100 mg daily and losartan 100 mg daily -Nifedipine 60 mg daily added by nephrology; will add Metoprolol 12.5 mg BID as BPs and HR still elevated  - hydralazine 5mg  IV PRN for SBP >180 or DBP >110 - continuous cardiac monitoring  End-stage renal disease: Receives dialysis on a T/TH/S, but frequently stops early due to headaches. . Creatinine is 8.78 and K 3.2.  - nephrology consulted;appreciate assistance - daily renal function panel  HIV disease/advanced AIDS: Last CD4 count on 1/18 was 4 and viral load to 455,000.   Takes abicavir 300mg  BID, azithromycin 600mg  BID once per week (on mondays), dapsone 100mg  daily and tivicay 50mg .   - continue abicavir 300mg  BID, dapsone 100mg  and tivicar 50mg  daily - repeat CD4/RNA quant - f/u genosure integrase and RNA ultraquant reflex to genotype - ID consulted;appreciate assistance  History of  DVT: Bilateral 2015 9/15. Due to immobility. Completed course of anticoagulation. No LE tenderness, erythema or palpable cords. CTA chest neg for PE.  - SQ heparin for DVT prophylaxis  -continue to monitor   Seizure disorder: Last seizure in January. Has only had 2 seizures in his lifetime. Taking  keppra every day.  - continue keppra 500mg  daily and an additional 250mg  on dialysis days (T/TH/S)  OSA: Supposed to have sleep study at the end of this month. History of obesity, but has lost >50 pounds d/t renal disease and HIV/advanced AIDS. Does not currently use CPAP.  - sleep study as an outpatient  Asthma: Takes albuterol. Has reportedly been using it around four times daily recently for shortness of breath. Denies tobacco.  - duonebs PRN - consider controller med - outpatient PFTs  FEN/GI: renal diet with fluid restriction Prophylaxis: subcutaneous heparin  Disposition: Pending improvement in SOB and excess volume   Subjective:  Complains of headache that is "only one notch better". Denies SOB and chest pain. Still with blurry vision although improving and able to read the clock.   Objective: Temp:  [97.9 F (36.6 C)-98.3 F (36.8 C)] 97.9 F (36.6 C) (03/17 0809) Pulse Rate:  [108-130] 111 (03/17 0809) Resp:  [13-28] 16 (03/17 0809) BP: (157-199)/(92-136) 179/102 (03/17 0809) SpO2:  [94 %-99 %] 98 % (03/17 0809) Weight:  [161 lb 3.2 oz (73.1 kg)-161 lb 6 oz (73.2 kg)] 161 lb 6 oz (73.2 kg) (03/17 0445) Physical Exam: General: lying in bed, NAD  Cardiovascular: Tachycardic regular rhythm. No murmur.  Respiratory: CTAB. Normal WOB.  Abdomen: +BS, Soft, NTND  Extremities: No LE edema Neuro: CN II-XII intact. Strength 5/5 in all extremities. Normal speech and though content.   Laboratory:  Recent Labs Lab 05/09/16 1336 05/09/16 1350 05/12/16 0747 05/13/16 0523  WBC 4.6  --  8.6 6.5  HGB 9.8* 10.9* 9.7* 10.1*  HCT 30.6* 32.0* 30.2* 31.1*  PLT 144*  --  99* 100*    Recent Labs Lab 05/09/16 1336 05/09/16 1350 05/12/16 0747 05/13/16 0500 05/13/16 0523  NA 134* 135 139  --  135  K 3.8 3.9 4.0  --  3.2*  CL 95* 94* 98*  --  96*  CO2 27  --  23  --  25  BUN 43* 48* 53*  --  43*  CREATININE 9.77* 10.70* 10.76*  --  8.78*  CALCIUM 8.5*  --  9.4  --  8.9   PROT 7.3  --   --  6.8  --   BILITOT 0.8  --   --  0.8  --   ALKPHOS 45  --   --  41  --   ALT 34  --   --  20  --   AST 44*  --   --  23  --   GLUCOSE 122* 124* 88  --  96     Imaging/Diagnostic Tests: Dg Chest 2 View  Result Date: 05/12/2016 CLINICAL DATA:  Shortness of breath and chest pain EXAM: CHEST  2 VIEW COMPARISON:  04/27/2016 FINDINGS: Cardiac shadow remains enlarged. Mild increased vascular congestion is noted without significant edema or effusion. No focal infiltrate is identified. No bony abnormality is seen. IMPRESSION: Changes consistent with mild central vascular congestion. Electronically Signed   By: Inez Catalina M.D.   On: 05/12/2016 08:11   Ct Head Wo Contrast  Result Date: 05/09/2016 CLINICAL DATA:  Headaches EXAM: CT HEAD WITHOUT CONTRAST TECHNIQUE: Contiguous axial images  were obtained from the base of the skull through the vertex without intravenous contrast. COMPARISON:  01/17/2016 FINDINGS: Brain: No evidence of acute infarction, hemorrhage, hydrocephalus, extra-axial collection or mass lesion/mass effect. Vascular: No hyperdense vessel or unexpected calcification. Skull: Normal. Negative for fracture or focal lesion. Sinuses/Orbits: Mucosal thickening is noted in the maxillary antra bilaterally stable from the previous exam. Other: None IMPRESSION: No acute intracranial abnormality noted. Mucosal thickening in the maxillary antra bilaterally. Electronically Signed   By: Inez Catalina M.D.   On: 05/09/2016 14:25   Ct Angio Chest Pe W Or Wo Contrast  Result Date: 05/12/2016 CLINICAL DATA:  Chest pain and shortness of breath since yesterday. EXAM: CT ANGIOGRAPHY CHEST WITH CONTRAST TECHNIQUE: Multidetector CT imaging of the chest was performed using the standard protocol during bolus administration of intravenous contrast. Multiplanar CT image reconstructions and MIPs were obtained to evaluate the vascular anatomy. CONTRAST:  180 cc of Isovue 370 COMPARISON:  Chest  radiographs dated 05/12/2016. FINDINGS: Cardiovascular: Initial images obtained with 100 cc of Isovue 370 did not demonstrate adequate opacification of the pulmonary arteries. Therefore, repeat images were obtained with an additional 80 cc of Isovue 370. These demonstrate normally opacified pulmonary arteries with no pulmonary arterial filling defects. The heart is enlarged. The pericardial effusion is demonstrated, measuring 1.9 cm in maximum thickness. Mediastinum/Nodes: No enlarged mediastinal, hilar, or axillary lymph nodes. Thyroid gland, trachea, and esophagus demonstrate no significant findings. Lungs/Pleura: Prominent pulmonary vasculature. Small left pleural effusion. Upper Abdomen: No acute abnormality. Musculoskeletal: Minimal thoracic spine degenerative changes. Review of the MIP images confirms the above findings. IMPRESSION: 1. No pulmonary emboli. 2. Small to moderate-sized pericardial effusion. 3. Small left pleural effusion. 4. Cardiomegaly and pulmonary vascular congestion. Electronically Signed   By: Claudie Revering M.D.   On: 05/12/2016 16:03     Nicolette Bang, DO 05/13/2016, 8:55 AM PGY-2, [place] Intern pager: [phone], text pages welcome

## 2016-05-13 NOTE — Progress Notes (Signed)
BP noted to be 150/80, heart rate remains in 110's to 120's.

## 2016-05-14 ENCOUNTER — Inpatient Hospital Stay (HOSPITAL_COMMUNITY): Payer: Medicare Other

## 2016-05-14 LAB — CBC
HCT: 30.7 % — ABNORMAL LOW (ref 39.0–52.0)
Hemoglobin: 9.7 g/dL — ABNORMAL LOW (ref 13.0–17.0)
MCH: 29.1 pg (ref 26.0–34.0)
MCHC: 31.6 g/dL (ref 30.0–36.0)
MCV: 92.2 fL (ref 78.0–100.0)
Platelets: 118 10*3/uL — ABNORMAL LOW (ref 150–400)
RBC: 3.33 MIL/uL — ABNORMAL LOW (ref 4.22–5.81)
RDW: 18.9 % — ABNORMAL HIGH (ref 11.5–15.5)
WBC: 6.1 10*3/uL (ref 4.0–10.5)

## 2016-05-14 LAB — RENAL FUNCTION PANEL
Albumin: 2.6 g/dL — ABNORMAL LOW (ref 3.5–5.0)
Anion gap: 12 (ref 5–15)
BUN: 43 mg/dL — ABNORMAL HIGH (ref 6–20)
CO2: 25 mmol/L (ref 22–32)
Calcium: 8.8 mg/dL — ABNORMAL LOW (ref 8.9–10.3)
Chloride: 96 mmol/L — ABNORMAL LOW (ref 101–111)
Creatinine, Ser: 10.41 mg/dL — ABNORMAL HIGH (ref 0.61–1.24)
GFR calc Af Amer: 7 mL/min — ABNORMAL LOW (ref >60)
GFR calc non Af Amer: 6 mL/min — ABNORMAL LOW (ref >60)
Glucose, Bld: 76 mg/dL (ref 65–99)
Phosphorus: 6 mg/dL — ABNORMAL HIGH (ref 2.5–4.6)
Potassium: 4.1 mmol/L (ref 3.5–5.1)
Sodium: 133 mmol/L — ABNORMAL LOW (ref 135–145)

## 2016-05-14 LAB — HEMOGLOBIN A1C
Hgb A1c MFr Bld: <4.2 % — ABNORMAL LOW (ref 4.8–5.6)
Mean Plasma Glucose: <74 mg/dL

## 2016-05-14 MED ORDER — SEVELAMER CARBONATE 800 MG PO TABS
1600.0000 mg | ORAL_TABLET | Freq: Three times a day (TID) | ORAL | Status: DC
  Filled 2016-05-14: qty 2

## 2016-05-14 MED ORDER — CAMPHOR-MENTHOL 0.5-0.5 % EX LOTN
TOPICAL_LOTION | CUTANEOUS | Status: DC | PRN
  Administered 2016-05-15: 01:00:00 via TOPICAL
  Filled 2016-05-14 (×2): qty 222

## 2016-05-14 MED ORDER — SEVELAMER CARBONATE 800 MG PO TABS: 1600.0000 mg | ORAL_TABLET | Freq: Three times a day (TID) | ORAL | Status: DC

## 2016-05-14 MED ORDER — METOPROLOL TARTRATE 25 MG PO TABS
25.0000 mg | ORAL_TABLET | Freq: Two times a day (BID) | ORAL | Status: DC
  Administered 2016-05-14 – 2016-05-19 (×10): 25 mg via ORAL
  Filled 2016-05-14 (×11): qty 1

## 2016-05-14 MED ORDER — SUCROFERRIC OXYHYDROXIDE 500 MG PO CHEW
500.0000 mg | CHEWABLE_TABLET | Freq: Three times a day (TID) | ORAL | Status: DC
  Administered 2016-05-14 – 2016-05-16 (×7): 500 mg via ORAL
  Filled 2016-05-14 (×9): qty 1

## 2016-05-14 NOTE — Progress Notes (Signed)
Patient continues to c/o headache but refused Tylenol or Tramadol during 7 a to 7 p shift because he says it does nothing.  BP continues to be elevated, meds continue to be adjusted.  HR remains in 110's to 120's.  Patient sleeping vast majority of shift with all lights off and blinds drawn.

## 2016-05-14 NOTE — Progress Notes (Signed)
RN called nephrology to ask about switching Renvela to tablet as patient refuses to take powder.  Med was switched then patient said he would only take the chewable tablet which per pharmacy we do not have.    Patient still c/o headache this morning, he told nurse yesterday that usually after several hours the headache gets better but that it did not happen this time cause they left him on the machine too long.  Rn told patient they were trying to get fluid off, he did not see if that way.  Will continue to monitor.

## 2016-05-14 NOTE — Progress Notes (Signed)
Family Medicine Teaching Service Daily Progress Note Intern Pager: [phone]  Patient name: Jim Wyatt Medical record number: [id number] Date of birth: [birth year] Age: 36 y.o. Gender: male  Primary Care Provider: [name], MD Consultants: Nephro  Code Status: FULL   Pt Overview and Major Events to Date:  3/16: Admitted to [facility]  3/17: HD   Assessment and Plan: Jim Wyatt is a 36 y.o. male presenting with one day of worsening shortness of breath with changes in vision. PMH is significant for poorly controlled hypertension, HIV, end-stage renal disease on dialysis (T/TH/S) and history of DVT x 1 in 2015 (not on any anti-coagulation).  Shortness of breath with chest pain:  Shortness of breath is most likely caused by volume overload in the setting of HD sessions that were stopped early x 2 due to headache. CXR with vascular congestion. Troponin peaked at  0.18 but downtrended (could be demand ischemia vs. Elevation due to ESRD), but EKG without signs of ischemia/infarct. In patient with AIDS with recent CD4 count of 4, there is concern for infectious etiology, however patient denies sputum production, fever or chills. CTA chest negative for PE but did reveal small to moderate sized pericardial effusion and small left pleural effusion. HD last 3/17. Echo 3/17 with EF 60-65% and moderate pericardial effusion. - telemetry and continuous pulse ox  - CD4/HIV RNA quant - continue home azithromycin and dapsone for HIV opportunistic infection prophylaxis - wean O2 as able - repeat 2 view CXR - duonebs q6hrs PRN - consider TB as possible cause of pericarditis, although patient denies symptoms (night sweats, fevers, feeling ill). Last TB test 2015.   Headache and vision changes:  Reports headaches about 3 times a week coinciding with his T/TH/S dialysis sessions. Typically does not have vision changes with the headaches. Nephrologist noted that these headaches are likely associated  with removal of fluid and acute changes in blood pressure associated with the dialysis. HTN in the setting of volume overload is also likely contributing. Given his CD4 count of 4, also need to think about opportunistic infections such as toxoplasmosis, although Pt states that he takes Dapsone as prescribed and had a recent CT head 05/09/16 that was normal. Pt states he saw an eye doctor last year, who told him he has "changes caused by high blood pressure".  - tylenol PRN headache - neuro checks q4hrs - MRI brain ordered, patient refused - consider neuro vs ophthalmology consult   Renovascular hypertension with hypertensive urgency: BP remains significantly elevated. Nephrologist stated that patient is not being dialyzed enough. He had reportedly been leaving dialysis about one hour early for the past several months. Takes losartan 100 mg daily, hydralazine 100 mg 3 times a day and apparently takes cartia xt, but this is not on his medication list.  - Continue hydralazine 100 mg daily and losartan 100 mg daily - Nifedipine 60 mg daily added by nephrology; will increase Metoprolol to 25 mg BID as BPs and HR still elevated  - hydralazine 5mg  IV PRN for SBP >180 or DBP >110 - continuous cardiac monitoring  End-stage renal disease: Receives dialysis on a T/TH/S, but frequently stops early due to headaches. Creatinine is 8.78 and K 3.2.  - nephrology consulted;appreciate assistance - daily renal function panel  HIV disease/advanced AIDS: Last CD4 count on 1/18 was 4 and viral load to 455,000.   Takes abicavir 300mg  BID, azithromycin 600mg  BID once per week (on mondays), dapsone 100mg  daily and tivicay 50mg .   -  continue abicavir 300mg  BID, dapsone 100mg  and tivicar 50mg  daily - repeat CD4/RNA quant - f/u genosure integrase and RNA ultraquant reflex to genotype - ID consulted;appreciate assistance  History of DVT: Bilateral 2015 9/15. Due to immobility. Completed course of anticoagulation. No LE  tenderness, erythema or palpable cords. CTA chest neg for PE.  - SQ heparin for DVT prophylaxis  -continue to monitor   Seizure disorder: Last seizure in January. Has only had 2 seizures in his lifetime. Taking keppra every day.  - continue keppra 500mg  daily and an additional 250mg  on dialysis days (T/TH/S)  OSA: Supposed to have sleep study at the end of this month. History of obesity, but has lost >50 pounds d/t renal disease and HIV/advanced AIDS. Does not currently use CPAP.  - sleep study as an outpatient  Asthma: Takes albuterol. Has reportedly been using it around four times daily recently for shortness of breath. Denies tobacco.  - duonebs PRN - consider controller med - outpatient PFTs  FEN/GI: renal diet with fluid restriction Prophylaxis: subcutaneous heparin  Disposition: Pending improvement in SOB and excess volume   Subjective:  Patient endorses continued headache today. Denies CP, wants to go back to sleep.   Objective: Temp:  [97.9 F (36.6 C)-98.7 F (37.1 C)] 98.7 F (37.1 C) (03/18 0657) Pulse Rate:  [106-124] 117 (03/18 0657) Resp:  [16-28] 18 (03/18 0657) BP: (132-193)/(70-137) 146/88 (03/18 0657) SpO2:  [95 %-99 %] 97 % (03/18 0657) Weight:  [157 lb 12.8 oz (71.6 kg)] 157 lb 12.8 oz (71.6 kg) (03/18 0657) Physical Exam: General: lying in bed, NAD with [place] in place.  Cardiovascular: Tachycardic regular rhythm. No murmur.  Respiratory: CTAB. Normal WOB.  Abdomen: +BS, Soft, NTND  Extremities: No LE edema Neuro: CN II-XII intact. Strength 5/5 in all extremities. Normal speech and though content.   Laboratory:  Recent Labs Lab 05/09/16 1336 05/09/16 1350 05/12/16 0747 05/13/16 0523  WBC 4.6  --  8.6 6.5  HGB 9.8* 10.9* 9.7* 10.1*  HCT 30.6* 32.0* 30.2* 31.1*  PLT 144*  --  99* 100*    Recent Labs Lab 05/09/16 1336 05/09/16 1350 05/12/16 0747 05/13/16 0500 05/13/16 0523  NA 134* 135 139  --  135  K 3.8 3.9 4.0  --  3.2*  CL 95* 94*  98*  --  96*  CO2 27  --  23  --  25  BUN 43* 48* 53*  --  43*  CREATININE 9.77* 10.70* 10.76*  --  8.78*  CALCIUM 8.5*  --  9.4  --  8.9  PROT 7.3  --   --  6.8  --   BILITOT 0.8  --   --  0.8  --   ALKPHOS 45  --   --  41  --   ALT 34  --   --  20  --   AST 44*  --   --  23  --   GLUCOSE 122* 124* 88  --  96     Imaging/Diagnostic Tests: Dg Chest 2 View  Result Date: 05/12/2016 CLINICAL DATA:  Shortness of breath and chest pain EXAM: CHEST  2 VIEW COMPARISON:  04/27/2016 FINDINGS: Cardiac shadow remains enlarged. Mild increased vascular congestion is noted without significant edema or effusion. No focal infiltrate is identified. No bony abnormality is seen. IMPRESSION: Changes consistent with mild central vascular congestion. Electronically Signed   By: Inez Catalina M.D.   On: 05/12/2016 08:11   Ct Head Wo Contrast  Result Date: 05/09/2016 CLINICAL DATA:  Headaches EXAM: CT HEAD WITHOUT CONTRAST TECHNIQUE: Contiguous axial images were obtained from the base of the skull through the vertex without intravenous contrast. COMPARISON:  01/17/2016 FINDINGS: Brain: No evidence of acute infarction, hemorrhage, hydrocephalus, extra-axial collection or mass lesion/mass effect. Vascular: No hyperdense vessel or unexpected calcification. Skull: Normal. Negative for fracture or focal lesion. Sinuses/Orbits: Mucosal thickening is noted in the maxillary antra bilaterally stable from the previous exam. Other: None IMPRESSION: No acute intracranial abnormality noted. Mucosal thickening in the maxillary antra bilaterally. Electronically Signed   By: Inez Catalina M.D.   On: 05/09/2016 14:25   Ct Angio Chest Pe W Or Wo Contrast  Result Date: 05/12/2016 CLINICAL DATA:  Chest pain and shortness of breath since yesterday. EXAM: CT ANGIOGRAPHY CHEST WITH CONTRAST TECHNIQUE: Multidetector CT imaging of the chest was performed using the standard protocol during bolus administration of intravenous contrast.  Multiplanar CT image reconstructions and MIPs were obtained to evaluate the vascular anatomy. CONTRAST:  180 cc of Isovue 370 COMPARISON:  Chest radiographs dated 05/12/2016. FINDINGS: Cardiovascular: Initial images obtained with 100 cc of Isovue 370 did not demonstrate adequate opacification of the pulmonary arteries. Therefore, repeat images were obtained with an additional 80 cc of Isovue 370. These demonstrate normally opacified pulmonary arteries with no pulmonary arterial filling defects. The heart is enlarged. The pericardial effusion is demonstrated, measuring 1.9 cm in maximum thickness. Mediastinum/Nodes: No enlarged mediastinal, hilar, or axillary lymph nodes. Thyroid gland, trachea, and esophagus demonstrate no significant findings. Lungs/Pleura: Prominent pulmonary vasculature. Small left pleural effusion. Upper Abdomen: No acute abnormality. Musculoskeletal: Minimal thoracic spine degenerative changes. Review of the MIP images confirms the above findings. IMPRESSION: 1. No pulmonary emboli. 2. Small to moderate-sized pericardial effusion. 3. Small left pleural effusion. 4. Cardiomegaly and pulmonary vascular congestion. Electronically Signed   By: Claudie Revering M.D.   On: 05/12/2016 16:03   Sela Hilding, MD 05/14/2016, 7:57 AM PGY-1, [facility] Intern pager: [phone], text pages welcome

## 2016-05-14 NOTE — Progress Notes (Signed)
Patient ID: Jim Wyatt, male   DOB: 08-22-1980, 36 y.o.   MRN: [id number] S: refusing meds and signed off HD last night.  Continues to c/o of HA O:BP (!) 146/88 (BP Location: Right Arm)   Pulse (!) 117   Temp 98.7 F (37.1 C) (Oral)   Resp 18   Ht 5\' 7"  (1.702 m)   Wt 71.6 kg (157 lb 12.8 oz)   SpO2 97%   BMI 24.71 kg/m   Intake/Output Summary (Last 24 hours) at 05/14/16 0946 Last data filed at 05/14/16 0600  Gross per 24 hour  Intake              580 ml  Output                0 ml  Net              580 ml   Intake/Output: I/O last 3 completed shifts: In: 580 [P.O.:580] Out: 2575 [Other:2575]  Intake/Output this shift:  No intake/output data recorded. Weight change: 2.578 kg (5 lb 10.9 oz) Gen: NAD CVS: no rub Resp:cta Abd: benign Ext:no edema, LUE AVF +T/B   Recent Labs Lab 05/09/16 1336 05/09/16 1350 05/12/16 0747 05/13/16 0500 05/13/16 0523 05/14/16 0824  NA 134* 135 139  --  135 133*  K 3.8 3.9 4.0  --  3.2* 4.1  CL 95* 94* 98*  --  96* 96*  CO2 27  --  23  --  25 25  GLUCOSE 122* 124* 88  --  96 76  BUN 43* 48* 53*  --  43* 43*  CREATININE 9.77* 10.70* 10.76*  --  8.78* 10.41*  ALBUMIN 2.9*  --   --  2.8* 2.8* 2.6*  CALCIUM 8.5*  --  9.4  --  8.9 8.8*  PHOS  --   --   --   --  4.3 6.0*  AST 44*  --   --  23  --   --   ALT 34  --   --  20  --   --    Liver Function Tests:  Recent Labs Lab 05/09/16 1336 05/13/16 0500 05/13/16 0523 05/14/16 0824  AST 44* 23  --   --   ALT 34 20  --   --   ALKPHOS 45 41  --   --   BILITOT 0.8 0.8  --   --   PROT 7.3 6.8  --   --   ALBUMIN 2.9* 2.8* 2.8* 2.6*   No results for input(s): LIPASE, AMYLASE in the last 168 hours. No results for input(s): AMMONIA in the last 168 hours. CBC:  Recent Labs Lab 05/09/16 1336  05/12/16 0747 05/13/16 0523 05/14/16 0824  WBC 4.6  --  8.6 6.5 6.1  NEUTROABS 2.9  --   --   --   --   HGB 9.8*  < > 9.7* 10.1* 9.7*  HCT 30.6*  < > 30.2* 31.1* 30.7*  MCV 88.4  --   91.2 90.4 92.2  PLT 144*  --  99* 100* 118*  < > = values in this interval not displayed. Cardiac Enzymes:  Recent Labs Lab 05/12/16 1757 05/12/16 2251 05/13/16 0523  TROPONINI 0.16* 0.18* 0.15*   CBG: No results for input(s): GLUCAP in the last 168 hours.  Iron Studies: No results for input(s): IRON, TIBC, TRANSFERRIN, FERRITIN in the last 72 hours. Studies/Results: Ct Angio Chest Pe W Or Wo Contrast  Result Date: 05/12/2016 CLINICAL  DATA:  Chest pain and shortness of breath since yesterday. EXAM: CT ANGIOGRAPHY CHEST WITH CONTRAST TECHNIQUE: Multidetector CT imaging of the chest was performed using the standard protocol during bolus administration of intravenous contrast. Multiplanar CT image reconstructions and MIPs were obtained to evaluate the vascular anatomy. CONTRAST:  180 cc of Isovue 370 COMPARISON:  Chest radiographs dated 05/12/2016. FINDINGS: Cardiovascular: Initial images obtained with 100 cc of Isovue 370 did not demonstrate adequate opacification of the pulmonary arteries. Therefore, repeat images were obtained with an additional 80 cc of Isovue 370. These demonstrate normally opacified pulmonary arteries with no pulmonary arterial filling defects. The heart is enlarged. The pericardial effusion is demonstrated, measuring 1.9 cm in maximum thickness. Mediastinum/Nodes: No enlarged mediastinal, hilar, or axillary lymph nodes. Thyroid gland, trachea, and esophagus demonstrate no significant findings. Lungs/Pleura: Prominent pulmonary vasculature. Small left pleural effusion. Upper Abdomen: No acute abnormality. Musculoskeletal: Minimal thoracic spine degenerative changes. Review of the MIP images confirms the above findings. IMPRESSION: 1. No pulmonary emboli. 2. Small to moderate-sized pericardial effusion. 3. Small left pleural effusion. 4. Cardiomegaly and pulmonary vascular congestion. Electronically Signed   By: Claudie Revering M.D.   On: 05/12/2016 16:03   . abacavir  600 mg  Oral Q supper  . Chlorhexidine Gluconate Cloth  6 each Topical Q0600  . dapsone  100 mg Oral Daily  . [START ON 05/20/2016] darbepoetin (ARANESP) injection - DIALYSIS  200 mcg Intravenous Q Sat-HD  . darunavir-cobicistat  1 tablet Oral QPM  . dolutegravir  50 mg Oral QPM  . doxercalciferol  6 mcg Intravenous Q T,Th,Sa-HD  . [START ON 05/18/2016] ferric gluconate (FERRLECIT/NULECIT) IV  125 mg Intravenous Q Thu-HD  . heparin  5,000 Units Subcutaneous Q8H  . hydrALAZINE  50 mg Oral TID  . levETIRAcetam  250 mg Oral Once per day on Tue Thu Sat  . levETIRAcetam  500 mg Oral Daily  . losartan  100 mg Oral QHS  . metoprolol tartrate  12.5 mg Oral BID  . multivitamin  1 tablet Oral QHS  . mupirocin ointment  1 application Nasal BID  . NIFEdipine  60 mg Oral Daily  . sevelamer carbonate  1,600 mg Oral TID WC  . sodium chloride flush  3 mL Intravenous Q12H  . vitamin C  500 mg Oral Daily    BMET    Component Value Date/Time   NA 133 (L) 05/14/2016 0824   K 4.1 05/14/2016 0824   CL 96 (L) 05/14/2016 0824   CO2 25 05/14/2016 0824   GLUCOSE 76 05/14/2016 0824   BUN 43 (H) 05/14/2016 0824   CREATININE 10.41 (H) 05/14/2016 0824   CREATININE 10.77 (H) 01/11/2015 1651   CALCIUM 8.8 (L) 05/14/2016 0824   GFRNONAA 6 (L) 05/14/2016 0824   GFRNONAA 6 (L) 01/11/2015 1651   GFRAA 7 (L) 05/14/2016 0824   GFRAA 7 (L) 01/11/2015 1651   CBC    Component Value Date/Time   WBC 6.1 05/14/2016 0824   RBC 3.33 (L) 05/14/2016 0824   HGB 9.7 (L) 05/14/2016 0824   HCT 30.7 (L) 05/14/2016 0824   HCT 30.0 (L) 03/24/2016 1517   PLT 118 (L) 05/14/2016 0824   PLT 201 03/24/2016 1517   MCV 92.2 05/14/2016 0824   MCV 96 03/24/2016 1517   MCH 29.1 05/14/2016 0824   MCHC 31.6 05/14/2016 0824   RDW 18.9 (H) 05/14/2016 0824   RDW 16.2 (H) 03/24/2016 1517   LYMPHSABS 0.5 (L) 05/09/2016 1336   MONOABS 0.5  05/09/2016 1336   EOSABS 0.6 05/09/2016 1336   BASOSABS 0.0 05/09/2016 1336   Dialysis Orders:  [facility] 3h 9mins BFR 450/800 2/2 EDW 65.5kg  -Hectorol 6 mcg IV q HD -Mircera 200 mcg IV 2 weeks (last dose 3/10)  -Venofer 50 mg IV q week (to start 3/22)  Assessment/Plan: 1. Dyspnea/Chest pain - per primary CT angio neg 2. ESRD - TTS - continues to sign off the machine after only 3 hours 05/13/16.  This is an ongoing issue and given his issues with compliance and HA will consult palliative care to discuss goals/limits of care as well as assist with his Headaches.  Not sure these are related to dialysis as much as HTN and psychosocial issues.   3. Hypertension/volume - BP elevated reports intolerance with multiple BP meds - now on hydrazlyine/ volume up/pericardial effusion and left pleural effusion due to poor dialysis adequacy (due to noncompliance.  Added Procardia to help with BP and HA. 4. Anemia - Hgb 10.1- last Mircera 200 3/10- due to for redose 3/24/weekly Fe 5. Metabolic bone disease - Cont VDRA, refused binders yesterday.  Wants a chewable, velphoro. 6. Nutrition - Renal diet/vitamins 7. HIV/AIDS - on ART per primary 8. Seizure disorder - on Keppra.  Would ask Neuro to evaluate HA's and can be done as an outpatient. 9. Headaches - LONG standing worse after about 3.5 - 3 hours on dialysis - BP notorious high but doubt that is the etiology- given 1 mg dilaudid but not off dialysis- primary addressing 10. Nonadherence to HD - he attributes to headaches but more likely due to psycho-social issues.  Will consult palliative care to help set goals/limits of care and address his pain issues.  He is slowly killing himself and we need to address his main issue of discomfort with HD.  He is not a candidate for PD due to AIDS and noncompliance.   Donetta Potts, MD [employer] [phone]

## 2016-05-14 NOTE — Progress Notes (Signed)
Pt refused his MRI, will continue to monitor, Thanks Arvella Nigh RN

## 2016-05-15 DIAGNOSIS — Z515 Encounter for palliative care: Secondary | ICD-10-CM

## 2016-05-15 DIAGNOSIS — G441 Vascular headache, not elsewhere classified: Secondary | ICD-10-CM

## 2016-05-15 DIAGNOSIS — Z7189 Other specified counseling: Secondary | ICD-10-CM

## 2016-05-15 LAB — T-HELPER CELLS (CD4) COUNT (NOT AT ARMC)
CD4 % Helper T Cell: 1 % — ABNORMAL LOW (ref 33–55)
CD4 T Cell Abs: 4 /uL — ABNORMAL LOW (ref 400–2700)

## 2016-05-15 LAB — CBC
HCT: 30 % — ABNORMAL LOW (ref 39.0–52.0)
Hemoglobin: 9.6 g/dL — ABNORMAL LOW (ref 13.0–17.0)
MCH: 29.4 pg (ref 26.0–34.0)
MCHC: 32 g/dL (ref 30.0–36.0)
MCV: 91.7 fL (ref 78.0–100.0)
Platelets: 139 10*3/uL — ABNORMAL LOW (ref 150–400)
RBC: 3.27 MIL/uL — ABNORMAL LOW (ref 4.22–5.81)
RDW: 19.5 % — ABNORMAL HIGH (ref 11.5–15.5)
WBC: 6.4 10*3/uL (ref 4.0–10.5)

## 2016-05-15 LAB — RENAL FUNCTION PANEL
Albumin: 2.6 g/dL — ABNORMAL LOW (ref 3.5–5.0)
Anion gap: 11 (ref 5–15)
BUN: 56 mg/dL — ABNORMAL HIGH (ref 6–20)
CO2: 24 mmol/L (ref 22–32)
Calcium: 9.1 mg/dL (ref 8.9–10.3)
Chloride: 99 mmol/L — ABNORMAL LOW (ref 101–111)
Creatinine, Ser: 12.49 mg/dL — ABNORMAL HIGH (ref 0.61–1.24)
GFR calc Af Amer: 5 mL/min — ABNORMAL LOW (ref >60)
GFR calc non Af Amer: 5 mL/min — ABNORMAL LOW (ref >60)
Glucose, Bld: 77 mg/dL (ref 65–99)
Phosphorus: 5.9 mg/dL — ABNORMAL HIGH (ref 2.5–4.6)
Potassium: 4.6 mmol/L (ref 3.5–5.1)
Sodium: 134 mmol/L — ABNORMAL LOW (ref 135–145)

## 2016-05-15 LAB — CRYPTOCOCCAL ANTIGEN: Crypto Ag: NEGATIVE

## 2016-05-15 MED ORDER — AZITHROMYCIN 600 MG PO TABS: 600.0000 mg | ORAL_TABLET | Freq: Once | ORAL | Status: DC

## 2016-05-15 MED ORDER — TOPIRAMATE 25 MG PO TABS
50.0000 mg | ORAL_TABLET | Freq: Two times a day (BID) | ORAL | Status: DC
  Administered 2016-05-15 – 2016-05-21 (×10): 50 mg via ORAL
  Filled 2016-05-15 (×12): qty 2

## 2016-05-15 MED ORDER — ACETAMINOPHEN 500 MG PO TABS
1000.0000 mg | ORAL_TABLET | Freq: Once | ORAL | Status: DC
  Filled 2016-05-15: qty 2

## 2016-05-15 MED ORDER — TOPIRAMATE 25 MG PO TABS
50.0000 mg | ORAL_TABLET | ORAL | Status: DC
  Administered 2016-05-16 – 2016-05-20 (×4): 50 mg via ORAL
  Filled 2016-05-15 (×2): qty 2

## 2016-05-15 MED ORDER — AZITHROMYCIN 600 MG PO TABS
600.0000 mg | ORAL_TABLET | Freq: Two times a day (BID) | ORAL | Status: DC
  Filled 2016-05-15: qty 1

## 2016-05-15 MED ORDER — HYDROCERIN EX CREA
TOPICAL_CREAM | Freq: Two times a day (BID) | CUTANEOUS | Status: DC
  Administered 2016-05-15 – 2016-05-16 (×2): via TOPICAL
  Administered 2016-05-17: 1 via TOPICAL
  Administered 2016-05-20: 10:00:00 via TOPICAL
  Filled 2016-05-15: qty 113

## 2016-05-15 MED ORDER — AZITHROMYCIN 600 MG PO TABS
1200.0000 mg | ORAL_TABLET | ORAL | Status: DC
  Administered 2016-05-15: 1200 mg via ORAL
  Filled 2016-05-15: qty 2

## 2016-05-15 NOTE — Progress Notes (Signed)
Family Medicine Teaching Service Daily Progress Note Intern Pager: [phone]  Patient name: Jim Wyatt Medical record number: [id number] Date of birth: 19-Apr-1980 Age: 36 y.o. Gender: male  Primary Care Provider: [name], MD Consultants: Nephro  Code Status: FULL   Pt Overview and Major Events to Date:  3/16: Admitted to [facility]  3/17: HD   Assessment and Plan: Jim Wyatt is a 36 y.o. male presenting with one day of worsening shortness of breath with changes in vision. PMH is significant for poorly controlled hypertension, HIV, end-stage renal disease on dialysis (T/TH/S) and history of DVT x 1 in 2015 (not on any anti-coagulation).  Headache, ongoing with resolved changes in vision:  Headaches are likely associated with removal of fluid and acute changes in blood pressure associated with the dialysis. HTN in the setting of volume overload is also likely contributing. Given his CD4 count of 4, also need to think about opportunistic infections like cryptococcal meningitis and toxoplasmosis. Discussed case with Dr. Wilder Glade with ID who recommended LP with CSF profile, cryptococcal Ag, toxoplasmosis Ag to start as well as serum cryptococcal ag.  - formal ID consult - tylenol PRN headache - neuro checks q4hrs - MRI brain ordered, patient refused - consider neuro vs ophthalmology consult   Shortness of breath with chest pain, stable:   SOB likely 2/2 overload due to poor compliance with HD and or stopping sessions early. HD last on 3/17 and stopped one hour early. Echo 3/17 with EF 60-65%. CXR unchanged from prior.  - telemetry and continuous pulse ox  - CD4/HIV RNA quant - continue home azithromycin (will receive weekly dose today 3/19) and dapsone for HIV opportunistic infection prophylaxis - wean O2 as able - duonebs q6hrs PRN  Renovascular hypertension with hypertensive urgency, improved: Takes losartan 100 mg daily, hydralazine 100 mg 3 times a day and apparently  takes cartia xt, but this is not on his medication list. Nephrology attributing this to not being dialyzed enough.  - Continue hydralazine 100 mg daily and losartan 100 mg daily - Nifedipine 60 mg daily added by nephrology; will increase Metoprolol to 25 mg BID as BPs and HR still elevated  - hydralazine 5mg  IV PRN for SBP >180 or DBP >110 - continuous cardiac monitoring  End-stage renal disease: Receives dialysis on a T/TH/S, but frequently stops early due to headaches. Phosphate to 5.9 and potassium 4.6. Signed off early from dialysis on 3/17. Nephrology put in palliative c/s - nephrology consulted;appreciate assistance - daily renal function panel - palliative c/s  HIV disease/advanced AIDS: Last CD4 count on 1/18 was 4 and viral load to 455,000.   Takes abicavir 300mg  BID, azithromycin 600mg  BID once per week (on mondays), dapsone 100mg  daily and tivicay 50mg .   - continue abicavir 300mg  BID, dapsone 100mg  and tivicar 50mg  daily - repeat CD4/RNA quant - f/u genosure integrase and RNA ultraquant reflex to genotype - formal ID consult - will perform LP  History of DVT: Bilateral 2015 9/15. Due to immobility. Completed course of anticoagulation. No LE tenderness, erythema or palpable cords. CTA chest neg for PE.  - SQ heparin for DVT prophylaxis  -continue to monitor   Seizure disorder: Last seizure in January. Has only had 2 seizures in his lifetime. Taking keppra every day.  - continue keppra 500mg  daily and an additional 250mg  on dialysis days (T/TH/S) - discontinued tramadol  OSA: Supposed to have sleep study at the end of this month. History of obesity, but has lost >50  pounds d/t renal disease and HIV/advanced AIDS. Does not currently use CPAP.  - sleep study as an outpatient  Asthma: Takes albuterol. Has reportedly been using it around four times daily recently for shortness of breath. Denies tobacco.  - duonebs PRN - consider controller med - outpatient PFTs  FEN/GI:  renal diet with fluid restriction Prophylaxis: subcutaneous heparin  Disposition: pending improvement with headache and infectious r/o  Subjective:  Patient endorses continued headache today, but denies vision changes, CP or SOB.   Objective: Temp:  [98.3 F (36.8 C)-98.9 F (37.2 C)] 98.4 F (36.9 C) (03/19 0600) Pulse Rate:  [102-117] 111 (03/19 0600) Resp:  [18-20] 18 (03/19 0600) BP: (115-166)/(61-113) 131/67 (03/19 0600) SpO2:  [93 %-96 %] 94 % (03/19 0600) Weight:  [158 lb 8 oz (71.9 kg)] 158 lb 8 oz (71.9 kg) (03/19 0600) Physical Exam: General: lying in bed, NAD with Frederickson in place.  Cardiovascular: Tachycardic regular rhythm. No murmur.  Respiratory: CTAB. Normal WOB.  Abdomen: +BS, Soft, NTND  Extremities: No LE edema Neuro: CN II-XII intact. Strength 5/5 in all extremities. Normal speech and though content.   Laboratory:  Recent Labs Lab 05/13/16 0523 05/14/16 0824 05/15/16 0517  WBC 6.5 6.1 6.4  HGB 10.1* 9.7* 9.6*  HCT 31.1* 30.7* 30.0*  PLT 100* 118* 139*    Recent Labs Lab 05/09/16 1336  05/13/16 0500 05/13/16 0523 05/14/16 0824 05/15/16 0517  NA 134*  < >  --  135 133* 134*  K 3.8  < >  --  3.2* 4.1 4.6  CL 95*  < >  --  96* 96* 99*  CO2 27  < >  --  25 25 24   BUN 43*  < >  --  43* 43* 56*  CREATININE 9.77*  < >  --  8.78* 10.41* 12.49*  CALCIUM 8.5*  < >  --  8.9 8.8* 9.1  PROT 7.3  --  6.8  --   --   --   BILITOT 0.8  --  0.8  --   --   --   ALKPHOS 45  --  41  --   --   --   ALT 34  --  20  --   --   --   AST 44*  --  23  --   --   --   GLUCOSE 122*  < >  --  96 76 77  < > = values in this interval not displayed.   Imaging/Diagnostic Tests: Dg Chest 2 View  Result Date: 05/12/2016 CLINICAL DATA:  Shortness of breath and chest pain EXAM: CHEST  2 VIEW COMPARISON:  04/27/2016 FINDINGS: Cardiac shadow remains enlarged. Mild increased vascular congestion is noted without significant edema or effusion. No focal infiltrate is identified. No  bony abnormality is seen. IMPRESSION: Changes consistent with mild central vascular congestion. Electronically Signed   By: Inez Catalina M.D.   On: 05/12/2016 08:11   Ct Head Wo Contrast  Result Date: 05/09/2016 CLINICAL DATA:  Headaches EXAM: CT HEAD WITHOUT CONTRAST TECHNIQUE: Contiguous axial images were obtained from the base of the skull through the vertex without intravenous contrast. COMPARISON:  01/17/2016 FINDINGS: Brain: No evidence of acute infarction, hemorrhage, hydrocephalus, extra-axial collection or mass lesion/mass effect. Vascular: No hyperdense vessel or unexpected calcification. Skull: Normal. Negative for fracture or focal lesion. Sinuses/Orbits: Mucosal thickening is noted in the maxillary antra bilaterally stable from the previous exam. Other: None IMPRESSION: No acute intracranial abnormality noted.  Mucosal thickening in the maxillary antra bilaterally. Electronically Signed   By: Inez Catalina M.D.   On: 05/09/2016 14:25   Ct Angio Chest Pe W Or Wo Contrast  Result Date: 05/12/2016 CLINICAL DATA:  Chest pain and shortness of breath since yesterday. EXAM: CT ANGIOGRAPHY CHEST WITH CONTRAST TECHNIQUE: Multidetector CT imaging of the chest was performed using the standard protocol during bolus administration of intravenous contrast. Multiplanar CT image reconstructions and MIPs were obtained to evaluate the vascular anatomy. CONTRAST:  180 cc of Isovue 370 COMPARISON:  Chest radiographs dated 05/12/2016. FINDINGS: Cardiovascular: Initial images obtained with 100 cc of Isovue 370 did not demonstrate adequate opacification of the pulmonary arteries. Therefore, repeat images were obtained with an additional 80 cc of Isovue 370. These demonstrate normally opacified pulmonary arteries with no pulmonary arterial filling defects. The heart is enlarged. The pericardial effusion is demonstrated, measuring 1.9 cm in maximum thickness. Mediastinum/Nodes: No enlarged mediastinal, hilar, or axillary  lymph nodes. Thyroid gland, trachea, and esophagus demonstrate no significant findings. Lungs/Pleura: Prominent pulmonary vasculature. Small left pleural effusion. Upper Abdomen: No acute abnormality. Musculoskeletal: Minimal thoracic spine degenerative changes. Review of the MIP images confirms the above findings. IMPRESSION: 1. No pulmonary emboli. 2. Small to moderate-sized pericardial effusion. 3. Small left pleural effusion. 4. Cardiomegaly and pulmonary vascular congestion. Electronically Signed   By: Claudie Revering M.D.   On: 05/12/2016 16:03   Eloise Levels, MD 05/15/2016, 7:16 AM PGY-1, [place] Intern pager: [phone], text pages welcome

## 2016-05-15 NOTE — Progress Notes (Signed)
Pt refusing to have continuous pulse ox on and also humidifier on.  Says " humidifier makes me short of breath and the pulse ox machine I don't need it feel better now."  02 2L 96%."  Instructed pt to call as needed.  Verbalized understanding.  Will continue to monitor.  Karie Kirks, Therapist, sports.

## 2016-05-15 NOTE — Progress Notes (Signed)
[facility] Progress Note   Subjective: no new c/o's.   Vitals:   05/14/16 2347 05/15/16 0600 05/15/16 0842 05/15/16 1040  BP: 115/69 131/67 (!) 152/84 134/77  Pulse: (!) 105 (!) 111  (!) 113  Resp:  18    Temp:  98.4 F (36.9 C)    TempSrc:  Oral    SpO2:  94%    Weight:  71.9 kg (158 lb 8 oz)    Height:        Inpatient medications: . abacavir  600 mg Oral Q supper  . azithromycin  1,200 mg Oral Weekly  . Chlorhexidine Gluconate Cloth  6 each Topical Q0600  . dapsone  100 mg Oral Daily  . [START ON 05/20/2016] darbepoetin (ARANESP) injection - DIALYSIS  200 mcg Intravenous Q Sat-HD  . darunavir-cobicistat  1 tablet Oral QPM  . dolutegravir  50 mg Oral QPM  . doxercalciferol  6 mcg Intravenous Q T,Th,Sa-HD  . [START ON 05/18/2016] ferric gluconate (FERRLECIT/NULECIT) IV  125 mg Intravenous Q Thu-HD  . heparin  5,000 Units Subcutaneous Q8H  . hydrALAZINE  50 mg Oral TID  . levETIRAcetam  250 mg Oral Once per day on Tue Thu Sat  . levETIRAcetam  500 mg Oral Daily  . losartan  100 mg Oral QHS  . metoprolol tartrate  25 mg Oral BID  . multivitamin  1 tablet Oral QHS  . mupirocin ointment  1 application Nasal BID  . NIFEdipine  60 mg Oral Daily  . sodium chloride flush  3 mL Intravenous Q12H  . sucroferric oxyhydroxide  500 mg Oral TID WC  . vitamin C  500 mg Oral Daily    acetaminophen **OR** acetaminophen, camphor-menthol, hydrALAZINE, hydrocortisone cream, ipratropium-albuterol, promethazine  Exam: Alert, no distress NO jvd Chest dec'd L base, R clear RRR  Abd soft ntnd Ext no edema  LUE AVF+bruit  Dialysis: [facility] 3h 26mins BFR 450/800 2/2 EDW 65.5kg  -Hectorol 6 mcg IV q HD -Mircera 200 mcg IV 2 weeks (last dose 3/10)  -Venofer 50 mg IV q week (to start 3/22)      Assessment: 1. Headaches - ongoing issue, per primary team/ ID 2. ESRD - cont HD TTS 3. CP - due to vol overload, resolved 4. Nonadherence - an issue w HD 5. HIV/ AIDS-  counts are low 6. HTN - poorly controlled, titrating medications up  7. Hist of DVT 8. Seizure d/o - pm Le[[ra 9. 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    Plan -  HD tomorrow   Kelly Splinter MD [facility] pager [phone]   05/15/2016, 11:48 AM    Recent Labs Lab 05/13/16 0523 05/14/16 0824 05/15/16 0517  NA 135 133* 134*  K 3.2* 4.1 4.6  CL 96* 96* 99*  CO2 25 25 24   GLUCOSE 96 76 77  BUN 43* 43* 56*  CREATININE 8.78* 10.41* 12.49*  CALCIUM 8.9 8.8* 9.1  PHOS 4.3 6.0* 5.9*    Recent Labs Lab 05/09/16 1336 05/13/16 0500 05/13/16 0523 05/14/16 0824 05/15/16 0517  AST 44* 23  --   --   --   ALT 34 20  --   --   --   ALKPHOS 45 41  --   --   --   BILITOT 0.8 0.8  --   --   --   PROT 7.3 6.8  --   --   --   ALBUMIN  2.9* 2.8* 2.8* 2.6* 2.6*    Recent Labs Lab 05/09/16 1336  05/13/16 0523 05/14/16 0824 05/15/16 0517  WBC 4.6  < > 6.5 6.1 6.4  NEUTROABS 2.9  --   --   --   --   HGB 9.8*  < > 10.1* 9.7* 9.6*  HCT 30.6*  < > 31.1* 30.7* 30.0*  MCV 88.4  < > 90.4 92.2 91.7  PLT 144*  < > 100* 118* 139*  < > = values in this interval not displayed. Iron/TIBC/Ferritin/ %Sat    Component Value Date/Time   IRON 59 01/12/2015 1617   TIBC 248 (L) 01/12/2015 1617   FERRITIN 1,050 (H) 02/01/2016 1836   IRONPCTSAT 24 01/12/2015 1617

## 2016-05-15 NOTE — Progress Notes (Signed)
Discussed case with patient's neurologist, Dr. Delice Lesch. She recommended that we try Topamax 50mg  bid plus an additional 50mg  of Topamax after HD. Orders placed.  Hyman Bible, MD

## 2016-05-15 NOTE — Consult Note (Signed)
Consultation Note Date: 05/15/2016   Patient Name: Jim Wyatt  DOB: Apr 20, 1980  MRN: [id number]  Age / Sex: 36 y.o., male  PCP: [name], MD Referring Physician: No att. providers found  Reason for Consultation: Establishing goals of care and Pain control  HPI/Patient Profile: 36 y.o. 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 He has been followed by his primary team, FMTS, Nephrology, and Infectious Disease.  His CP and SOB appear to have resolved with HD and by starting oxygen via n/c.  He is still complaining of HA, but his vision has improved.  We were called for GOC and symptom management of HA.    Clinical Assessment and Goals of Care: After reviewing Epic notes, labs and imaging and receiving report from the primary team, I spoke with Jim Wyatt at bedside.    Jim Wyatt initially does not want to speak with me and refers me to his fiance at bedside. His affect is very flat and he does not want to engage with me.   His primary complaints are (1) HA that started Saturday in HD and was not relieved with dilaudid, and (2) a rash on is hands, back, ankles and legs.  The rash is dry darkened/thickened patches of skin that itch and burn and cause significant irritation.  He confirms that his mother is the person that would make medical decisions for him if he was unable to do so.  He is the only child of a single parent.  Further he confirms his code status is full code.  He can not foresee a time that he would want to discontinue HD unless it caused him physical pain that was too severe to tolerate.  I asked about non-compliance with HD.  He states that now that he is under the care of Ms. Lorrene Reid and has changed dialysis  centers there will be no further non-compliance.  He had some issues with HD but those have been resolved by changing centers.  He develops HAs from time to time in HD.  Eating a meal before coming to HD helps prevent the HAs.    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  He has a Art therapist and would like to fly more.  He manages to work and then take HD early in the morning so he can return to work again in the evening.  I spoke at length with is mother on the phone.  She tells me that 1 year ago when he first started HD her son had difficulty with compliance.  That is no longer true.  She has become involved in his care and he is very compliant with both his medications and his hemodialysis.  She feels [facility] is doing a great job with him.  She states very strongly that he will continue to  be compliant because he wanted to live.  She talked with me about the seizures he developed while in HD.  He sees a neurologist, Dr. Ellouise Newer for the seizures.  She asks that if he is seen by a neurologist for HA - could it be Dr. Delice Lesch?  Finally she expresses frustration - both Mrs. Butts and her sister are willing to donate a kidney to [facility].  They want to know what to do to start the evaluation process for a kidney transplant and is frustrated that no one has talked with her about this.  Primary Decision Maker:  PATIENT    SUMMARY OF RECOMMENDATIONS    Neurology consultation (Dr. Santiago Glad Aquino's office) for HA.  Please provide Mrs. Butts (mother) information about evaluation for kidney transplant and the requirements.  Code Status/Advance Care Planning:  Full code   Symptom Management:   Uncertain etiology of HA.  Perhaps changes in BP during HD.  In which case I'm not certain what can be done.  Agree with neurology consult for recommendations regarding HA.  Would consider a triptan trial if nephrology agrees.  Narcotics are not effective in this  situation.  Rash - discuss with nephrology or outpatient derm.  Will request Eucerin in the mean time  Consider restarting Effexor that he was on prior to admission. (his headache is no better without it)  Additional Recommendations (Limitations, Scope, Preferences):  Full Scope Treatment  Prognosis:   Unable to determine  Discharge Planning: D/C to home when appropriate.  Patient anxious to go home.      Primary Diagnoses: Present on Admission: . Shortness of breath   I have reviewed the medical record, interviewed the patient and family, and examined the patient. The following aspects are pertinent.  Past Medical History:  Diagnosis Date  . Asthma   . CAP (community acquired pneumonia) 10/2013  . Cellulitis 11/2013   LLE  . DVT (deep venous thrombosis) ([place]) 10/2013   BLE  . 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 Went on HD Dec 2016.   [name] Fever   . Headache   . HIV infection ([place]) dx'd 10/2013  . Hypertension   . Leg pain   . Multiple environmental allergies    "trees, dogs, cats"  . Multiple food allergies   . Shingles < 2010  . 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 Marital status: Single    Spouse name: N/A  . Number of children: N/A  . Years of education: N/A   Social History Main Topics  . Smoking status: Never Smoker  . Smokeless tobacco: Never Used  . Alcohol use No     Comment: I stopped drinking along time ago "  . Drug use: No  . Sexual activity: Not Asked   Other Topics Concern  . None   Social History Narrative  . None   Family History  Problem Relation Age of Onset  . Hypertension Mother   . Diabetes Maternal Grandmother   . Kidney disease Maternal Grandmother    Scheduled Meds: . abacavir  600 mg Oral Q supper  . azithromycin  1,200 mg Oral Weekly  . Chlorhexidine Gluconate Cloth  6 each Topical Q0600  . dapsone  100 mg Oral Daily  .  [START ON 05/20/2016] darbepoetin (ARANESP) injection - DIALYSIS  200 mcg Intravenous Q Sat-HD  . darunavir-cobicistat  1 tablet Oral QPM  .  dolutegravir  50 mg Oral QPM  . doxercalciferol  6 mcg Intravenous Q T,Th,Sa-HD  . [START ON 05/18/2016] ferric gluconate (FERRLECIT/NULECIT) IV  125 mg Intravenous Q Thu-HD  . heparin  5,000 Units Subcutaneous Q8H  . hydrALAZINE  50 mg Oral TID  . levETIRAcetam  250 mg Oral Once per day on Tue Thu Sat  . levETIRAcetam  500 mg Oral Daily  . losartan  100 mg Oral QHS  . metoprolol tartrate  25 mg Oral BID  . multivitamin  1 tablet Oral QHS  . mupirocin ointment  1 application Nasal BID  . NIFEdipine  60 mg Oral Daily  . sodium chloride flush  3 mL Intravenous Q12H  . sucroferric oxyhydroxide  500 mg Oral TID WC  . vitamin C  500 mg Oral Daily   Continuous Infusions: PRN Meds:.acetaminophen **OR** acetaminophen, camphor-menthol, hydrALAZINE, hydrocortisone cream, ipratropium-albuterol, promethazine Allergies  Allergen Reactions  . Bactrim [Sulfamethoxazole-Trimethoprim] Other (See Comments)    Renal failure  . Reglan [Metoclopramide] Other (See Comments)    Causes ticks/ jerks  . Vancomycin Other (See Comments)    Patient states vancomycin caused kidney injury  . Amlodipine Palpitations and Other (See Comments)    fatigue  . Augmentin [Amoxicillin-Pot Clavulanate] Hives   Review of Systems No complaints of CP, SOB, anorexia, changes in bowel habits.  Does complain of HA, itchy burning skin rash  Physical Exam  Well developed male, A&O, NAD, flat affect CV rrr Resp cta Skin multiple areas of patches of thickened/darkened skin on back hands and ankles.  Vital Signs: BP 131/86 (BP Location: Right Arm)   Pulse 100   Temp 98.3 F (36.8 C) (Oral)   Resp 18   Ht 5\' 7"  (1.702 m)   Wt 71.9 kg (158 lb 8 oz) Comment: b scale  SpO2 91%   BMI 24.82 kg/m  Pain Assessment: 0-10   Pain Score: Asleep   SpO2: SpO2: 91 % O2 Device:SpO2: 91 % O2  Flow Rate: .O2 Flow Rate (L/min): 2 L/min  IO: Intake/output summary:  Intake/Output Summary (Last 24 hours) at 05/15/16 1430 Last data filed at 05/15/16 0700  Gross per 24 hour  Intake              200 ml  Output                0 ml  Net              200 ml    LBM: Last BM Date: 05/13/16 Baseline Weight: Weight: 69 kg (152 lb 1.9 oz) Most recent weight: Weight: 71.9 kg (158 lb 8 oz) (b scale)     Palliative Assessment/Data:   Flowsheet Rows     Most Recent Value  Intake Tab  Referral Department  Nephrology  Unit at Time of Referral  Cardiac/Telemetry Unit  Palliative Care Primary Diagnosis  Sepsis/Infectious Disease  Date Notified  05/14/16  Palliative Care Type  New Palliative care  Reason for referral  Clarify Goals of Care  Date of Admission  05/12/16  # of days IP prior to Palliative referral  2  Clinical Assessment  Psychosocial & Spiritual Assessment  Palliative Care Outcomes      Time In: 2:00 Time Out: 3:10 Time Total: 70 Greater than 50%  of this time was spent counseling and coordinating care related to the above assessment and plan.  Signed by: Imogene Burn, PA-C Palliative Medicine Pager: [phone]  Please contact Palliative Medicine Team phone at [phone]  for questions and concerns.  For individual provider: See Shea [name]

## 2016-05-16 ENCOUNTER — Inpatient Hospital Stay (HOSPITAL_COMMUNITY): Payer: Medicare Other

## 2016-05-16 DIAGNOSIS — R109 Unspecified abdominal pain: Secondary | ICD-10-CM

## 2016-05-16 LAB — CBC
HCT: 31.5 % — ABNORMAL LOW (ref 39.0–52.0)
Hemoglobin: 10 g/dL — ABNORMAL LOW (ref 13.0–17.0)
MCH: 29.2 pg (ref 26.0–34.0)
MCHC: 31.7 g/dL (ref 30.0–36.0)
MCV: 92.1 fL (ref 78.0–100.0)
Platelets: 148 10*3/uL — ABNORMAL LOW (ref 150–400)
RBC: 3.42 MIL/uL — ABNORMAL LOW (ref 4.22–5.81)
RDW: 20 % — ABNORMAL HIGH (ref 11.5–15.5)
WBC: 7.5 10*3/uL (ref 4.0–10.5)

## 2016-05-16 LAB — RENAL FUNCTION PANEL
Albumin: 2.7 g/dL — ABNORMAL LOW (ref 3.5–5.0)
Anion gap: 16 — ABNORMAL HIGH (ref 5–15)
BUN: 64 mg/dL — ABNORMAL HIGH (ref 6–20)
CO2: 23 mmol/L (ref 22–32)
Calcium: 9.4 mg/dL (ref 8.9–10.3)
Chloride: 97 mmol/L — ABNORMAL LOW (ref 101–111)
Creatinine, Ser: 14.81 mg/dL — ABNORMAL HIGH (ref 0.61–1.24)
GFR calc Af Amer: 4 mL/min — ABNORMAL LOW (ref >60)
GFR calc non Af Amer: 4 mL/min — ABNORMAL LOW (ref >60)
Glucose, Bld: 62 mg/dL — ABNORMAL LOW (ref 65–99)
Phosphorus: 6.3 mg/dL — ABNORMAL HIGH (ref 2.5–4.6)
Potassium: 4.8 mmol/L (ref 3.5–5.1)
Sodium: 136 mmol/L (ref 135–145)

## 2016-05-16 LAB — PROTIME-INR
INR: 0.91
Prothrombin Time: 12.2 seconds (ref 11.4–15.2)

## 2016-05-16 MED ORDER — HYDROMORPHONE HCL 1 MG/ML IJ SOLN
1.0000 mg | Freq: Once | INTRAMUSCULAR | Status: AC
  Administered 2016-05-16: 1 mg via INTRAVENOUS

## 2016-05-16 MED ORDER — SODIUM CHLORIDE 0.9 % IV SOLN: 100.0000 mL | INTRAVENOUS | Status: DC | PRN

## 2016-05-16 MED ORDER — LIDOCAINE-PRILOCAINE 2.5-2.5 % EX CREA: 1.0000 "application " | TOPICAL_CREAM | CUTANEOUS | Status: DC | PRN

## 2016-05-16 MED ORDER — LIDOCAINE HCL (PF) 1 % IJ SOLN
5.0000 mL | INTRAMUSCULAR | Status: DC | PRN
Start: 2016-05-16 — End: 2016-05-17
  Filled 2016-05-16 (×2): qty 5

## 2016-05-16 MED ORDER — IOPAMIDOL (ISOVUE-300) INJECTION 61%
INTRAVENOUS | Status: AC
  Administered 2016-05-16: 100 mL
  Filled 2016-05-16: qty 100

## 2016-05-16 MED ORDER — SUCROFERRIC OXYHYDROXIDE 500 MG PO CHEW
500.0000 mg | CHEWABLE_TABLET | Freq: Three times a day (TID) | ORAL | Status: DC
  Administered 2016-05-17 – 2016-05-20 (×7): 500 mg via ORAL
  Filled 2016-05-16 (×17): qty 1

## 2016-05-16 MED ORDER — SUCROFERRIC OXYHYDROXIDE 500 MG PO CHEW
500.0000 mg | CHEWABLE_TABLET | Freq: Three times a day (TID) | ORAL | Status: DC
  Filled 2016-05-16: qty 1

## 2016-05-16 MED ORDER — HYDROMORPHONE HCL 1 MG/ML IJ SOLN
INTRAMUSCULAR | Status: AC
Start: 2016-05-16 — End: 2016-05-16
  Administered 2016-05-16: 1 mg via INTRAVENOUS
  Filled 2016-05-16: qty 1

## 2016-05-16 MED ORDER — ALTEPLASE 2 MG IJ SOLR: 2.0000 mg | Freq: Once | INTRAMUSCULAR | Status: DC | PRN

## 2016-05-16 MED ORDER — HEPARIN SODIUM (PORCINE) 1000 UNIT/ML DIALYSIS
1000.0000 [IU] | INTRAMUSCULAR | Status: DC | PRN
  Filled 2016-05-16: qty 1

## 2016-05-16 MED ORDER — HEPARIN SODIUM (PORCINE) 1000 UNIT/ML DIALYSIS
3000.0000 [IU] | INTRAMUSCULAR | Status: DC | PRN
  Filled 2016-05-16: qty 3

## 2016-05-16 MED ORDER — PENTAFLUOROPROP-TETRAFLUOROETH EX AERO: 1.0000 "application " | INHALATION_SPRAY | CUTANEOUS | Status: DC | PRN

## 2016-05-16 MED ORDER — DOXERCALCIFEROL 4 MCG/2ML IV SOLN
INTRAVENOUS | Status: AC
  Administered 2016-05-16: 6 ug via INTRAVENOUS
  Filled 2016-05-16: qty 4

## 2016-05-16 MED ORDER — IOPAMIDOL (ISOVUE-300) INJECTION 61%
INTRAVENOUS | Status: AC
  Filled 2016-05-16: qty 30

## 2016-05-16 NOTE — Progress Notes (Signed)
Pt having complain of headache, pt refused to take tylenol, Family medicine paged and he was given Tylenol 1000 mg extra strength but he is refusing that either, pt is requesting to have chest X-ray done because he is feeling congested and paged MD again, he said "he is not going to do that chest x-ray now" and pt suppose to try at least tylenol but pt said that "it does not work at him all"  Will inform the patient that MD is unable to add any kind of narcotics and other medicine now, will inform to the patient. scheduled for HD tomorrow, will continue to monitor the patient.  Jim Wyatt

## 2016-05-16 NOTE — Progress Notes (Signed)
Family Medicine Teaching Service Daily Progress Note Intern Pager: [phone]  Patient name: Jim Wyatt Medical record number: [id number] Date of birth: Dec 06, 1980 Age: 36 y.o. Gender: male  Primary Care Provider: [name], MD Consultants: Nephro  Code Status: FULL   Pt Overview and Major Events to Date:  3/16: Admitted to [facility]  3/17: HD   Assessment and Plan: Jim Wyatt is a 36 y.o. male presenting with one day of worsening shortness of breath with changes in vision. PMH is significant for poorly controlled hypertension, HIV, end-stage renal disease on dialysis (T/TH/S) and history of DVT x 1 in 2015 (not on any anti-coagulation).  Headache, ongoing with resolved changes in vision:  HA rated 3/10 this morning without vision changes. Likely related to fluid overload/acute volume removal and also concerning for infectious etiology given CD4 count of 4.  Serum cryptococcal ag is negative - LP today and will send away for cryptococcal ag and toxoplasmosis ag - formal ID consult after LP - tylenol PRN headache - neuro checks q4hrs - MRI brain ordered, patient refused - cont topimax  Shortness of breath with chest pain, stable:   SOB likely 2/2 overload due to poor compliance with HD and or stopping sessions early. HD last on 3/17 and stopped one hour early. Echo 3/17 with EF 60-65%. Patient complaining of SOB this morning. Does have crackles throughout L lung, however is in left lateral decubitus position making this likely a dependent finding.  - telemetry and continuous pulse ox  - HIV RNA quant - continue home azithromycin (once per week on Monday) and dapsone for HIV opportunistic infection prophylaxis - wean O2 as able - duonebs q6hrs PRN - ID consult following LP  Renovascular hypertension with hypertensive urgency:  BP to 166/107. Denies CP or changes in vision, does have improved HA from yesterday.  - Continue hydralazine 100 mg daily and losartan 100 mg  daily - Nifedipine 60 mg daily added by nephrology; will increase Metoprolol to 25 mg BID as BPs and HR still elevated  - hydralazine 5mg  IV PRN for SBP >180 or DBP >110 - continuous cardiac monitoring  End-stage renal disease: Receives dialysis on a T/TH/S, but frequently stops early due to headaches. Phosphate to 6.3  and potassium 4.8.  - plan for HD today - nephrology consulted;appreciate assistance - daily renal function panel - palliative c/s- patient wishes to continue receiving dialysis, but will stop if becomes too painful  HIV disease/advanced AIDS: Last CD4 count on 1/18 was 4 and viral load to 455,000.   Takes abicavir 300mg  BID, azithromycin 600mg  BID once per week (on mondays), dapsone 100mg  daily and tivicay 50mg .   - continue abicavir 300mg  BID, dapsone 100mg  and tivicar 50mg  daily - repeat CD4/RNA quant - f/u genosure integrase and RNA ultraquant reflex to genotype - formal ID consult - will perform LP  History of DVT: Bilateral 2015 9/15. Due to immobility. Completed course of anticoagulation. No LE tenderness, erythema or palpable cords. CTA chest neg for PE.  - SQ heparin for DVT prophylaxis  -continue to monitor   Seizure disorder: Last seizure in January. Has only had 2 seizures in his lifetime. Taking keppra every day.  - continue keppra 500mg  daily and an additional 250mg  on dialysis days (T/TH/S) - discontinued tramadol  OSA: Supposed to have sleep study at the end of this month. History of obesity, but has lost >50 pounds d/t renal disease and HIV/advanced AIDS. Does not currently use CPAP.  - sleep  study as an outpatient  Asthma: Takes albuterol. Has reportedly been using it around four times daily recently for shortness of breath. Denies tobacco.  - duonebs PRN - consider controller med - outpatient PFTs  FEN/GI: renal diet with fluid restriction Prophylaxis: subcutaneous heparin  Disposition: pending improvement with headache and infectious  r/o  Subjective:  Headache improved from yesterday and is complaining of SOB this morning, but is satting well on RA ([place] not in place)  Objective: Temp:  [98.3 F (36.8 C)-98.7 F (37.1 C)] 98.7 F (37.1 C) (03/19 2123) Pulse Rate:  [100-116] 114 (03/20 0649) Resp:  [16-18] 16 (03/20 0649) BP: (130-167)/(77-117) 167/117 (03/20 0649) SpO2:  [91 %-97 %] 97 % (03/20 0649) Weight:  [159 lb 6.4 oz (72.3 kg)] 159 lb 6.4 oz (72.3 kg) (03/20 0630) Physical Exam: General: lying in bed, NAD with [place] outside of nostrils Cardiovascular: Tachycardic regular rhythm. No murmur.  Respiratory: CTAB. Normal WOB.  Abdomen: +BS, Soft, NTND  Extremities: No LE edema Neuro: CN II-XII intact. Strength 5/5 in all extremities. Normal speech and though content.   Laboratory:  Recent Labs Lab 05/14/16 0824 05/15/16 0517 05/16/16 0408  WBC 6.1 6.4 7.5  HGB 9.7* 9.6* 10.0*  HCT 30.7* 30.0* 31.5*  PLT 118* 139* 148*    Recent Labs Lab 05/09/16 1336  05/13/16 0500  05/14/16 0824 05/15/16 0517 05/16/16 0408  NA 134*  < >  --   < > 133* 134* 136  K 3.8  < >  --   < > 4.1 4.6 4.8  CL 95*  < >  --   < > 96* 99* 97*  CO2 27  < >  --   < > 25 24 23   BUN 43*  < >  --   < > 43* 56* 64*  CREATININE 9.77*  < >  --   < > 10.41* 12.49* 14.81*  CALCIUM 8.5*  < >  --   < > 8.8* 9.1 9.4  PROT 7.3  --  6.8  --   --   --   --   BILITOT 0.8  --  0.8  --   --   --   --   ALKPHOS 45  --  41  --   --   --   --   ALT 34  --  20  --   --   --   --   AST 44*  --  23  --   --   --   --   GLUCOSE 122*  < >  --   < > 76 77 62*  < > = values in this interval not displayed.   Imaging/Diagnostic Tests: Dg Chest 2 View  Result Date: 05/12/2016 CLINICAL DATA:  Shortness of breath and chest pain EXAM: CHEST  2 VIEW COMPARISON:  04/27/2016 FINDINGS: Cardiac shadow remains enlarged. Mild increased vascular congestion is noted without significant edema or effusion. No focal infiltrate is identified. No bony abnormality is  seen. IMPRESSION: Changes consistent with mild central vascular congestion. Electronically Signed   By: Inez Catalina M.D.   On: 05/12/2016 08:11   Ct Head Wo Contrast  Result Date: 05/09/2016 CLINICAL DATA:  Headaches EXAM: CT HEAD WITHOUT CONTRAST TECHNIQUE: Contiguous axial images were obtained from the base of the skull through the vertex without intravenous contrast. COMPARISON:  01/17/2016 FINDINGS: Brain: No evidence of acute infarction, hemorrhage, hydrocephalus, extra-axial collection or mass lesion/mass effect. Vascular: No hyperdense vessel or  unexpected calcification. Skull: Normal. Negative for fracture or focal lesion. Sinuses/Orbits: Mucosal thickening is noted in the maxillary antra bilaterally stable from the previous exam. Other: None IMPRESSION: No acute intracranial abnormality noted. Mucosal thickening in the maxillary antra bilaterally. Electronically Signed   By: Inez Catalina M.D.   On: 05/09/2016 14:25   Ct Angio Chest Pe W Or Wo Contrast  Result Date: 05/12/2016 CLINICAL DATA:  Chest pain and shortness of breath since yesterday. EXAM: CT ANGIOGRAPHY CHEST WITH CONTRAST TECHNIQUE: Multidetector CT imaging of the chest was performed using the standard protocol during bolus administration of intravenous contrast. Multiplanar CT image reconstructions and MIPs were obtained to evaluate the vascular anatomy. CONTRAST:  180 cc of Isovue 370 COMPARISON:  Chest radiographs dated 05/12/2016. FINDINGS: Cardiovascular: Initial images obtained with 100 cc of Isovue 370 did not demonstrate adequate opacification of the pulmonary arteries. Therefore, repeat images were obtained with an additional 80 cc of Isovue 370. These demonstrate normally opacified pulmonary arteries with no pulmonary arterial filling defects. The heart is enlarged. The pericardial effusion is demonstrated, measuring 1.9 cm in maximum thickness. Mediastinum/Nodes: No enlarged mediastinal, hilar, or axillary lymph nodes. Thyroid  gland, trachea, and esophagus demonstrate no significant findings. Lungs/Pleura: Prominent pulmonary vasculature. Small left pleural effusion. Upper Abdomen: No acute abnormality. Musculoskeletal: Minimal thoracic spine degenerative changes. Review of the MIP images confirms the above findings. IMPRESSION: 1. No pulmonary emboli. 2. Small to moderate-sized pericardial effusion. 3. Small left pleural effusion. 4. Cardiomegaly and pulmonary vascular congestion. Electronically Signed   By: Claudie Revering M.D.   On: 05/12/2016 16:03   Eloise Levels, MD 05/16/2016, 8:00 AM PGY-1, [place] Intern pager: [phone], text pages welcome

## 2016-05-16 NOTE — Discharge Summary (Signed)
[facility] Discharge Summary  Patient name: Jim Wyatt Medical record number: [id number] Date of birth: [birth year] Age: 36 y.o. Gender: male Date of Admission: 05/12/2016  Date of Discharge: 05/21/2016 Admitting Physician: Kinnie Feil, MD  Primary Care Provider: [name], MD Consultants: Infectious diseases, nephrology, cardiology  Indication for Hospitalization: Headaches with vision changes  Discharge Diagnoses/Problem List:  Headache with vision changes Shortness of breath with chest pain Renovascular hypertension with hypertensive urgency End-stage renal disease Anemia of chronic disease Advanced AIDS Seizure disorder Asthma  Disposition: Discharge home  Discharge Condition: Stable, improved.  Discharge Exam:  General: lying in bed in NAD with [place] in place (oxygen off) Cardiovascular: tachycardic, RR, S4 and 2+ palpable pulses Respiratory: NWOB, CTABL Abdomen: +BS, Soft, NTND  Extremities: No LE edema Neuro:no focal neurological deficits.  Normal speech and though content.   Brief Hospital Course:   Headache with vision changes:  Presented with one day of worsening shortness of breath with vision changes and hypertensive urgency. Headaches reportedly coincided with dialysis days, which are Tuesdays Thursdays and Saturdays. Headaches improved with blood pressure management and after completing several dialysis sessions. CT head without contrast showed no acute intracranial abnormality neuro exam showed no focal neurological deficits. Given advanced AIDS and headache with vision changes and CD4 of 4, LP was performed that showed negative culture, cryptococcal ag, and toxoplasmosis and normal cell counts as well.  Additionally started with topamax which apparently helped. Recommended continuing strict dialysis attendance and completing whole sessions as well as continuing with topamax.   Shortness of breath:  CXR showed  interval minimal left basilar atelectasis and small bilateral pleural effusions and stable cardiomegaly. Patient was started on 2 L of oxygen in the emergency department, but had no desaturations. Initial exam revealed bibasilar crackles and rhonchi in the left midlung and tachycardia to 115. Wells score of 3, d-dimer was sent which was elevated, but this is chronically elevated. CTA showed no pulmonary embolus but did show a small to moderate-sized pericardial effusion and a small left pleural effusion as well as cardiomegaly and pulmonary vascular congestion. Echo showed normal ejection fraction with wall thickness increased in a pattern of severe left ventricle hypertrophy and severely dilated left atrium. Nephrology followed during the duration of admission and patient received dialysis several times. Initially he stopped at mid-session 2/2 pain, but was able to finish when given dilauded. At one point he complained of chest pain and had mildly elevated troponin and had normal EKG and was evaluated by Cardiology.  Cardiology and nephrology attributed his symptoms of headache and shortness of breath to fluid overload secondary to lack of dialysis, both of which improved after compliance with BP meds and dialysis. Was discharged on hydralazine 75mg  TID, toprol XL 100mg  BID, procardia 60mg  daily for BP control.   AIDS: CD4 count to 4 with pending RNA quant. Per chart reviewed genotype was ordered from recommendations of ID clinic. LP showed no current toxoplasmosis or cryptococcal ag in CSF.  At one point complaining of abd pain and given advanced aids, ordered CT abd/pelvis which showed non-specific mild diffuse intraperitoneal and retroperitoneal soft tissue. Continued patient on antiretroviral medication during admission as well as prophylactic antibiotics. Recommending follow up with ID clinic and continuing to take medication as prescribed.   Issues for Follow Up:  1. ESRD: Needs to follow up with  dialysis and complete full sessions 2. HTN: Continue hydralazine 75mg  TID, toprol XL 100mg  BID, procardia 60mg  daily and with  dialysis 3. Headache: Cont to HTN meds as above as well as topamax and dialysis 4. AIDS: Cont with current regimen and prophlaxis and follow up with ID  Significant Procedures: LP, CT head, CTA chest, echo  Significant Labs and Imaging:   CT abd/pelvis:  IMPRESSION: 1. No acute abnormality seen to explain the patient's symptoms. 2. Mild diffuse intraperitoneal and retroperitoneal soft tissue edema within the abdomen and pelvis. 3. Diffuse soft tissue edema along the abdominal wall raises question for mild anasarca. 4. Mild bilateral renal atrophy. 5. Trace left pleural effusion, with associated atelectasis. 6. Mild cardiomegaly.  Small pericardial effusion.  CT head: IMPRESSION: No acute intracranial abnormality noted.  Mucosal thickening in the maxillary antra bilaterally.  CTA chest: IMPRESSION: 1. No pulmonary emboli. 2. Small to moderate-sized pericardial effusion. 3. Small left pleural effusion. 4. Cardiomegaly and pulmonary vascular congestion.  LP: Normal cell count, no growth in cultures, cryptococcal ag, toxoplasmosis ag negative    Recent Labs Lab 05/19/16 0253 05/20/16 0506 05/21/16 0913  WBC 10.9* 8.1 6.0  HGB 10.5* 9.9* 10.1*  HCT 33.9* 32.3* 32.2*  PLT 176 183 194    Recent Labs Lab 05/17/16 0454 05/18/16 0424 05/18/16 1342 05/19/16 0718 05/20/16 0506 05/21/16 0913  NA 137 137 134* 137 138 136  K 4.4 3.9 4.2 5.2* 5.2* 3.8  CL 96* 93* 99* 100* 98* 97*  CO2 27 29 27 27 27 27   GLUCOSE 71 85 115* 77 79 134*  BUN 31* 44* 12 21* 25* 34*  CREATININE 9.33* 11.92* 5.20* 8.19* 6.98* 6.74*  CALCIUM 9.5 9.8 8.5* 9.7 9.8 9.9  PHOS 5.6*  --  2.7 4.8* 5.6* 5.6*  ALBUMIN 3.1*  --  2.7* 2.8* 2.7* 2.9*    Results/Tests Pending at Time of Discharge: RNA quant, genosure integrase  Discharge Medications:  Allergies as of  05/21/2016      Reactions   Bactrim [sulfamethoxazole-trimethoprim] Other (See Comments)   Renal failure   Reglan [metoclopramide] Other (See Comments)   Causes ticks/ jerks   Vancomycin Other (See Comments)   Patient states vancomycin caused kidney injury   Amlodipine Palpitations, Other (See Comments)   fatigue   Augmentin [amoxicillin-pot Clavulanate] Hives      Medication List    STOP taking these medications   ondansetron 8 MG disintegrating tablet Commonly known as:  ZOFRAN ODT   triamcinolone 0.025 % ointment Commonly known as:  KENALOG     TAKE these medications   abacavir 300 MG tablet Commonly known as:  ZIAGEN Take 2 tablets (600 mg total) by mouth daily with supper.   acetaminophen 500 MG tablet Commonly known as:  TYLENOL Take 1,000 mg by mouth every 6 (six) hours as needed (pain).   albuterol 108 (90 Base) MCG/ACT inhaler Commonly known as:  PROVENTIL HFA;VENTOLIN HFA Inhale 2 puffs into the lungs every 6 (six) hours as needed for wheezing or shortness of breath.   azithromycin 600 MG tablet Commonly known as:  ZITHROMAX Take 1,200 mg by mouth once a week.   dapsone 100 MG tablet Take 1 tablet (100 mg total) by mouth daily.   darunavir-cobicistat 800-150 MG tablet Commonly known as:  PREZCOBIX Take 1 tablet by mouth every evening. Swallow whole. Do NOT crush, break or chew tablets. Take with food.   dolutegravir 50 MG tablet Commonly known as:  TIVICAY Take 1 tablet (50 mg total) by mouth every evening.   hydrALAZINE 25 MG tablet Commonly known as:  APRESOLINE Take 3 tablets (75  mg total) by mouth 3 (three) times daily. What changed:  medication strength  how much to take  additional instructions   levETIRAcetam 250 MG tablet Commonly known as:  KEPPRA Take 1 tablet (250 mg) by mouth on Tuesday, Thursday, Saturday at 7pm What changed:  how much to take  how to take this  when to take this  additional instructions   levETIRAcetam  500 MG tablet Commonly known as:  KEPPRA Take 1 tablet (500 mg total) by mouth daily. What changed:  Another medication with the same name was changed. Make sure you understand how and when to take each.   losartan 100 MG tablet Commonly known as:  COZAAR Take 100 mg by mouth at bedtime.   metoprolol succinate 100 MG 24 hr tablet Commonly known as:  TOPROL-XL Take 1 tablet (100 mg total) by mouth daily. Take with or immediately following a meal.   NIFEdipine 60 MG 24 hr tablet Commonly known as:  PROCARDIA-XL/ADALAT CC Take 1 tablet (60 mg total) by mouth daily.   topiramate 50 MG tablet Commonly known as:  TOPAMAX Take 1 tablet (50 mg total) by mouth 2 (two) times daily.   venlafaxine XR 150 MG 24 hr capsule Commonly known as:  EFFEXOR-XR TAKE 1 CAPSULE(150 MG) BY MOUTH DAILY   Vitamin C 250 MG Chew Chew 500 mg by mouth daily.       Discharge Instructions: Please refer to Patient Instructions section of EMR for full details.  Patient was counseled important signs and symptoms that should prompt return to medical care, changes in medications, dietary instructions, activity restrictions, and follow up appointments.   Follow-Up Appointments: Follow-up Information    [name], MD.   Specialty:  Internal Medicine Contact information: [place] [id number] [phone]           Eloise Levels, MD 05/23/2016, 3:53 PM PGY-1, [place]

## 2016-05-16 NOTE — Procedures (Signed)
Stable on dialysis now, BFR 350. Is 6-7 over dry weight.  Plan UF 4 L today.  May need extra HD while here for vol excess.    I was present at this dialysis session, have reviewed the session itself and made  appropriate changes Kelly Splinter MD [place] pager [phone]   05/16/2016, 3:21 PM

## 2016-05-16 NOTE — Progress Notes (Addendum)
Pt continues to complain about a rash/itching to multiple sites, including head, chest, ears, right hand, and legs. No discolored or raised areas observed by this RN, however pt's right hand appears irritated from scratching and mildly swollen. RN has explained that this may be due to high serum phosphorus levels. Pt requests to see a dermatologist. Will page MD and continue to monitor.

## 2016-05-17 ENCOUNTER — Ambulatory Visit: Payer: Self-pay | Admitting: Internal Medicine

## 2016-05-17 ENCOUNTER — Ambulatory Visit: Payer: Self-pay | Admitting: Neurology

## 2016-05-17 DIAGNOSIS — Z833 Family history of diabetes mellitus: Secondary | ICD-10-CM

## 2016-05-17 DIAGNOSIS — Z8249 Family history of ischemic heart disease and other diseases of the circulatory system: Secondary | ICD-10-CM

## 2016-05-17 DIAGNOSIS — Z841 Family history of disorders of kidney and ureter: Secondary | ICD-10-CM

## 2016-05-17 DIAGNOSIS — Z881 Allergy status to other antibiotic agents status: Secondary | ICD-10-CM

## 2016-05-17 DIAGNOSIS — Z888 Allergy status to other drugs, medicaments and biological substances status: Secondary | ICD-10-CM

## 2016-05-17 DIAGNOSIS — R0600 Dyspnea, unspecified: Secondary | ICD-10-CM

## 2016-05-17 DIAGNOSIS — I160 Hypertensive urgency: Secondary | ICD-10-CM

## 2016-05-17 DIAGNOSIS — R0602 Shortness of breath: Secondary | ICD-10-CM

## 2016-05-17 DIAGNOSIS — Z21 Asymptomatic human immunodeficiency virus [HIV] infection status: Secondary | ICD-10-CM

## 2016-05-17 LAB — CSF CELL COUNT WITH DIFFERENTIAL
RBC Count, CSF: 0 /mm3
Tube #: 3
WBC, CSF: 1 /mm3 (ref 0–5)

## 2016-05-17 LAB — CBC
HCT: 32.9 % — ABNORMAL LOW (ref 39.0–52.0)
Hemoglobin: 10.4 g/dL — ABNORMAL LOW (ref 13.0–17.0)
MCH: 29.3 pg (ref 26.0–34.0)
MCHC: 31.6 g/dL (ref 30.0–36.0)
MCV: 92.7 fL (ref 78.0–100.0)
Platelets: 139 10*3/uL — ABNORMAL LOW (ref 150–400)
RBC: 3.55 MIL/uL — ABNORMAL LOW (ref 4.22–5.81)
RDW: 20.1 % — ABNORMAL HIGH (ref 11.5–15.5)
WBC: 6.9 10*3/uL (ref 4.0–10.5)

## 2016-05-17 LAB — RENAL FUNCTION PANEL
Albumin: 3.1 g/dL — ABNORMAL LOW (ref 3.5–5.0)
Anion gap: 14 (ref 5–15)
BUN: 31 mg/dL — ABNORMAL HIGH (ref 6–20)
CO2: 27 mmol/L (ref 22–32)
Calcium: 9.5 mg/dL (ref 8.9–10.3)
Chloride: 96 mmol/L — ABNORMAL LOW (ref 101–111)
Creatinine, Ser: 9.33 mg/dL — ABNORMAL HIGH (ref 0.61–1.24)
GFR calc Af Amer: 8 mL/min — ABNORMAL LOW (ref >60)
GFR calc non Af Amer: 7 mL/min — ABNORMAL LOW (ref >60)
Glucose, Bld: 71 mg/dL (ref 65–99)
Phosphorus: 5.6 mg/dL — ABNORMAL HIGH (ref 2.5–4.6)
Potassium: 4.4 mmol/L (ref 3.5–5.1)
Sodium: 137 mmol/L (ref 135–145)

## 2016-05-17 LAB — PROTEIN AND GLUCOSE, CSF
Glucose, CSF: 50 mg/dL (ref 40–70)
Total  Protein, CSF: 34 mg/dL (ref 15–45)

## 2016-05-17 LAB — CRYPTOCOCCAL ANTIGEN, CSF: Crypto Ag: NEGATIVE

## 2016-05-17 MED ORDER — DIPHENHYDRAMINE HCL 25 MG PO CAPS
25.0000 mg | ORAL_CAPSULE | Freq: Four times a day (QID) | ORAL | Status: DC | PRN
  Administered 2016-05-17 – 2016-05-20 (×2): 25 mg via ORAL
  Filled 2016-05-17: qty 1

## 2016-05-17 MED ORDER — FENTANYL CITRATE (PF) 100 MCG/2ML IJ SOLN
25.0000 ug | Freq: Once | INTRAMUSCULAR | Status: AC
  Administered 2016-05-17: 25 ug via INTRAVENOUS
  Filled 2016-05-17: qty 2

## 2016-05-17 MED ORDER — HYDROCORTISONE 1 % EX CREA
TOPICAL_CREAM | Freq: Two times a day (BID) | CUTANEOUS | Status: DC
  Administered 2016-05-21: 10:00:00 via TOPICAL
  Filled 2016-05-17: qty 28

## 2016-05-17 NOTE — Progress Notes (Signed)
Family Medicine Teaching Service Daily Progress Note Intern Pager: [phone]  Patient name: Jim Wyatt Medical record number: [id number] Date of birth: [birth year] Age: 36 y.o. Gender: male  Primary Care Provider: [name], MD Consultants: Nephro  Code Status: FULL   Pt Overview and Major Events to Date:  3/16: Admitted to [facility]  3/17: HD   Assessment and Plan: Jim Wyatt is a 36 y.o. male presenting with one day of worsening shortness of breath with changes in vision. PMH is significant for poorly controlled hypertension, HIV, end-stage renal disease on dialysis (T/TH/S) and history of DVT x 1 in 2015 (not on any anti-coagulation).  Headache, ongoing with resolved changes in vision:  HA rated 8/10 this morning without vision changes. BP to 121/82 this AM. Was able to complete dialysis yesterday. LP performed today.  - f/u LP studies - ID consulted; awaiting recs - tylenol PRN headache - neuro checks q4hrs - cont topamax  Shortness of breath with chest pain, stable:   SOB likely 2/2 overload due to stopping HD sessions early. Completed full session on 3/20. Echo 3/17 with EF 60-65%. Patient complaining of SOB this morning. Does have crackles throughout R lung now however is in right lateral decubitus position making this likely a dependent finding.  - telemetry and continuous pulse ox  - HIV RNA quant - continue home azithromycin (once per week on Monday) and dapsone for HIV opportunistic infection prophylaxis - wean O2 as able - duonebs q6hrs PRN - ID consulted;awaiting recs  Abd pain: reporting abd pain yesterday. Given CD4 count of 4 had CT abd/pelvis that showed non-specific findings of mild diffuse intraperitoneal and retroperitoneal soft tissue edema within the abdomen and pelvis. Diffuse soft tissue edema along the abdominal wall raises question for mild anasarca. Abd pain improved this morning and was able to eat breakfast.  -continue to  monitor  Renovascular hypertension with hypertensive urgency:  BP to 121/82 this AM and denies vision changes.  - Continue hydralazine 100 mg daily and losartan 100 mg daily - Nifedipine 60 mg daily added by nephrology; will increase Metoprolol to 25 mg BID as BPs and HR still elevated  - hydralazine 5mg  IV PRN for SBP >180 or DBP >110 - continuous cardiac monitoring  End-stage renal disease: Receives dialysis on a T/TH/S, but frequently stops early due to headaches. Phosphate to 5.6  and potassium 4.4 this AM. Dialysis yesterday and completed full session.  Did have a headache half-way through, but received dose of dilauded that healped.  - plan for HD thursday - nephrology consulted;appreciate assistance - daily renal function panel - palliative c/s- patient wishes to continue receiving dialysis, but will stop if becomes too painful  HIV disease/advanced AIDS: Last CD4 count on 1/18 was 4 and viral load to 455,000.   Takes abicavir 300mg  BID, azithromycin 600mg  BID once per week (on mondays), dapsone 100mg  daily and tivicay 50mg .   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 Due to immobility. Completed course of anticoagulation. No LE tenderness, erythema or palpable cords. CTA chest neg for PE.  - SQ heparin for DVT prophylaxis  -continue to monitor   Seizure disorder: Last seizure in January. Has only had 2 seizures in his lifetime. Taking keppra every day.  - continue keppra 500mg  daily and an  additional 250mg  on dialysis days (T/TH/S) - discontinued tramadol  OSA: Supposed to have sleep study at the end of this month. History of obesity, but has lost >50 pounds d/t renal disease and HIV/advanced AIDS. Does not currently use CPAP.  - sleep study as an outpatient  Asthma: Takes  albuterol. Has reportedly been using it around four times daily recently for shortness of breath. Denies tobacco.  - duonebs PRN - consider controller med - outpatient PFTs  FEN/GI: renal diet with fluid restriction Prophylaxis: subcutaneous heparin  Disposition: pending improvement with headache and infectious r/o  Subjective:  Complaining of HA this morning. Denies abd pain. Denies vision changes or chest pain.   Objective: Temp:  [98 F (36.7 C)-98.8 F (37.1 C)] 98.8 F (37.1 C) (03/21 0542) Pulse Rate:  [103-116] 108 (03/21 0542) Resp:  [15-19] 18 (03/21 0542) BP: (121-194)/(82-128) 121/82 (03/21 0542) SpO2:  [93 %-100 %] 100 % (03/21 0542) Weight:  [149 lb 11.1 oz (67.9 kg)-159 lb 6.3 oz (72.3 kg)] 152 lb 4.8 oz (69.1 kg) (03/21 0542) Physical Exam: General: lying in bed, NAD with [place] outside of nostrils Cardiovascular: Tachycardic regular rhythm. No murmur.  Respiratory: CTAB. Normal WOB.  Abdomen: +BS, Soft, NTND  Extremities: No LE edema Neuro: CN II-XII intact. Strength 5/5 in all extremities. Normal speech and though content.   Laboratory:  Recent Labs Lab 05/15/16 0517 05/16/16 0408 05/17/16 0454  WBC 6.4 7.5 6.9  HGB 9.6* 10.0* 10.4*  HCT 30.0* 31.5* 32.9*  PLT 139* 148* 139*    Recent Labs Lab 05/13/16 0500  05/15/16 0517 05/16/16 0408 05/17/16 0454  NA  --   < > 134* 136 137  K  --   < > 4.6 4.8 4.4  CL  --   < > 99* 97* 96*  CO2  --   < > 24 23 27   BUN  --   < > 56* 64* 31*  CREATININE  --   < > 12.49* 14.81* 9.33*  CALCIUM  --   < > 9.1 9.4 9.5  PROT 6.8  --   --   --   --   BILITOT 0.8  --   --   --   --   ALKPHOS 41  --   --   --   --   ALT 20  --   --   --   --   AST 23  --   --   --   --   GLUCOSE  --   < > 77 62* 71  < > = values in this interval not displayed.   Imaging/Diagnostic Tests: Dg Chest 2 View  Result Date: 05/12/2016 CLINICAL DATA:  Shortness of breath and chest pain EXAM: CHEST  2 VIEW COMPARISON:  04/27/2016  FINDINGS: Cardiac shadow remains enlarged. Mild increased vascular congestion is noted without significant edema or effusion. No focal infiltrate is identified. No bony abnormality is seen. IMPRESSION: Changes consistent with mild central vascular congestion. Electronically Signed   By: Inez Catalina M.D.   On: 05/12/2016 08:11   Ct Head Wo Contrast  Result Date: 05/09/2016 CLINICAL DATA:  Headaches EXAM: CT HEAD WITHOUT CONTRAST TECHNIQUE: Contiguous axial images were obtained from the base of the skull through the vertex without intravenous contrast. COMPARISON:  01/17/2016 FINDINGS: Brain: No evidence of acute infarction, hemorrhage, hydrocephalus, extra-axial collection or mass lesion/mass effect. Vascular: No hyperdense vessel or unexpected calcification. Skull: Normal. Negative for fracture or focal lesion. Sinuses/Orbits: Mucosal  thickening is noted in the maxillary antra bilaterally stable from the previous exam. Other: None IMPRESSION: No acute intracranial abnormality noted. Mucosal thickening in the maxillary antra bilaterally. Electronically Signed   By: Inez Catalina M.D.   On: 05/09/2016 14:25   Ct Angio Chest Pe W Or Wo Contrast  Result Date: 05/12/2016 CLINICAL DATA:  Chest pain and shortness of breath since yesterday. EXAM: CT ANGIOGRAPHY CHEST WITH CONTRAST TECHNIQUE: Multidetector CT imaging of the chest was performed using the standard protocol during bolus administration of intravenous contrast. Multiplanar CT image reconstructions and MIPs were obtained to evaluate the vascular anatomy. CONTRAST:  180 cc of Isovue 370 COMPARISON:  Chest radiographs dated 05/12/2016. FINDINGS: Cardiovascular: Initial images obtained with 100 cc of Isovue 370 did not demonstrate adequate opacification of the pulmonary arteries. Therefore, repeat images were obtained with an additional 80 cc of Isovue 370. These demonstrate normally opacified pulmonary arteries with no pulmonary arterial filling defects. The  heart is enlarged. The pericardial effusion is demonstrated, measuring 1.9 cm in maximum thickness. Mediastinum/Nodes: No enlarged mediastinal, hilar, or axillary lymph nodes. Thyroid gland, trachea, and esophagus demonstrate no significant findings. Lungs/Pleura: Prominent pulmonary vasculature. Small left pleural effusion. Upper Abdomen: No acute abnormality. Musculoskeletal: Minimal thoracic spine degenerative changes. Review of the MIP images confirms the above findings. IMPRESSION: 1. No pulmonary emboli. 2. Small to moderate-sized pericardial effusion. 3. Small left pleural effusion. 4. Cardiomegaly and pulmonary vascular congestion. Electronically Signed   By: Claudie Revering M.D.   On: 05/12/2016 16:03   Eloise Levels, MD 05/17/2016, 7:27 AM PGY-1, [place] Intern pager: [phone], text pages welcome

## 2016-05-17 NOTE — Consult Note (Signed)
[name] for Infectious Disease    Date of Admission:  05/12/2016          Reason for Consult: Intermittent headaches in the setting of uncontrolled HIV infection   Referring Physician: Dr. Rosana Berger  Active Problems:   HIV disease [facility])   Shortness of breath   Dyspnea   Hypertensive emergency   Pericardial effusion   Tachycardia   Palliative care encounter   Goals of care, counseling/discussion   . abacavir  600 mg Oral Q supper  . acetaminophen  1,000 mg Oral Once  . azithromycin  1,200 mg Oral Weekly  . Chlorhexidine Gluconate Cloth  6 each Topical Q0600  . dapsone  100 mg Oral Daily  . [START ON 05/20/2016] darbepoetin (ARANESP) injection - DIALYSIS  200 mcg Intravenous Q Sat-HD  . darunavir-cobicistat  1 tablet Oral QPM  . dolutegravir  50 mg Oral QPM  . doxercalciferol  6 mcg Intravenous Q T,Th,Sa-HD  . [START ON 05/18/2016] ferric gluconate (FERRLECIT/NULECIT) IV  125 mg Intravenous Q Thu-HD  . heparin  5,000 Units Subcutaneous Q8H  . hydrALAZINE  50 mg Oral TID  . hydrocerin   Topical BID  . hydrocortisone cream   Topical BID  . levETIRAcetam  250 mg Oral Once per day on Tue Thu Sat  . levETIRAcetam  500 mg Oral Daily  . losartan  100 mg Oral QHS  . metoprolol tartrate  25 mg Oral BID  . multivitamin  1 tablet Oral QHS  . mupirocin ointment  1 application Nasal BID  . NIFEdipine  60 mg Oral Daily  . sodium chloride flush  3 mL Intravenous Q12H  . sucroferric oxyhydroxide  500 mg Oral TID WC  . topiramate  50 mg Oral BID  . topiramate  50 mg Oral Once per day on Tue Thu Sat  . vitamin C  500 mg Oral Daily    Recommendations: 1. Continue current antiretroviral regimen 2. Await repeat viral load 3. HIV genotype and integrase resistance assays ordered 4. Follow-up as previously scheduled with Dr. Carlyle Basques in our clinic on 05/30/2016   Assessment: Jim Wyatt has advanced HIV infection with an extremely low CD4 count of 4. His last  viral load in September was 455,000. A repeat viral load is pending. This obviously puts him at high risk for HIV related opportunistic infection although I do not see any evidence of infection at this time. His lumbar puncture is normal. Also, the fact that his headaches are intermittent and only associated with dialysis days makes CNS infection extremely unlikely. He tells me that he takes his medications faithfully and only rarely misses. When he was seen by Dr. Baxter Flattery in February a call was placed to his pharmacy and they indicated he had not filled his prescriptions since last October. I'm not sure he is not taking his medication or if his virus has developed resistance. I will continue his current antiretroviral regimen for now. I have ordered HIV resistance assays to be drawn while here. He can follow up in clinic in early April. I will sign off now.   HPI: Jim Wyatt is a 36 y.o. male with HIV infection who is followed by my partner, Dr. Carlyle Basques. His viral load has not been suppressed for many years although he states that he takes his medications with few misses. He has end-stage renal disease on dialysis and was admitted recently with shortness of breath due to  volume overload. He has described intermittent headaches since starting hemodialysis in 2016. They occur only on hemodialysis days. He notes that they are worse if he has not eaten well before dialysis. He underwent lumbar puncture today.   Review of Systems: Review of Systems  Constitutional: Negative for chills, diaphoresis, fever, malaise/fatigue and weight loss.  Respiratory: Negative for cough.   Cardiovascular: Negative for chest pain.  Gastrointestinal: Negative for abdominal pain, diarrhea, nausea and vomiting.  Skin: Negative for rash.  Neurological: Positive for headaches.    Past Medical History:  Diagnosis Date  . Asthma   . CAP (community acquired pneumonia) 10/2013  . Cellulitis 11/2013   LLE  . DVT  (deep venous thrombosis) ([place]) 10/2013   BLE  . 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 Went on HD Dec 2016.   [name] Fever   . Headache   . HIV infection ([place]) dx'd 10/2013  . Hypertension   . Leg pain   . Multiple environmental allergies    "trees, dogs, cats"  . Multiple food allergies   . Shingles < 2010  . 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 Smoking status: Never Smoker  . Smokeless tobacco: Never Used  . Alcohol use No     Comment: I stopped drinking along time ago "    Family History  Problem Relation Age of Onset  . Hypertension Mother   . Diabetes Maternal Grandmother   . Kidney disease Maternal Grandmother    Allergies  Allergen Reactions  . Bactrim [Sulfamethoxazole-Trimethoprim] Other (See Comments)    Renal failure  . Reglan [Metoclopramide] Other (See Comments)    Causes ticks/ jerks  . Vancomycin Other (See Comments)    Patient states vancomycin caused kidney injury  . Amlodipine Palpitations and Other (See Comments)    fatigue  . Augmentin [Amoxicillin-Pot Clavulanate] Hives    OBJECTIVE: Blood pressure (!) 168/88, pulse (!) 111, temperature 98.1 F (36.7 C), temperature source Oral, resp. rate 20, height 5\' 7"  (1.702 m), weight 152 lb 4.8 oz (69.1 kg), SpO2 93 %.  Physical Exam  Constitutional: He is oriented to person, place, and time.  He is a very pleasant young man sitting up in bed. He is in no distress.  HENT:  Mouth/Throat: No oropharyngeal exudate.  Neck: Neck supple.  Cardiovascular: Normal rate and regular rhythm.   No murmur heard. Pulmonary/Chest: Effort normal and breath sounds normal.  Abdominal: Soft. There is no tenderness.  Musculoskeletal: Normal range of motion. He exhibits no edema or tenderness.  Neurological: He is alert and oriented to person, place, and time.  Skin: No rash noted.  Psychiatric: Mood and affect normal.     Lab Results Lab Results  Component Value Date   WBC 6.9 05/17/2016   HGB 10.4 (L) 05/17/2016   HCT 32.9 (L) 05/17/2016   MCV 92.7 05/17/2016   PLT 139 (L) 05/17/2016    Lab Results  Component Value Date   CREATININE 9.33 (H) 05/17/2016   BUN 31 (H) 05/17/2016   NA 137 05/17/2016   K 4.4 05/17/2016   CL 96 (L) 05/17/2016   CO2 27 05/17/2016    Lab Results  Component Value Date   ALT 20 05/13/2016   AST 23 05/13/2016   ALKPHOS 41 05/13/2016   BILITOT 0.8 05/13/2016    HIV 1 RNA Quant (copies/mL)  Date Value  02/01/2016 455,000  11/05/2015 125,000  07/06/2015 86,000   CD4 T Cell Abs (/uL)  Date Value  05/12/2016 4 (L)  03/15/2016 20 (L)  02/01/2016 20 (L)    Microbiology: Recent Results (from the past 240 hour(s))  MRSA PCR Screening     Status: Abnormal   Collection Time: 05/12/16  6:13 PM  Result Value Ref Range Status   MRSA by PCR POSITIVE (A) NEGATIVE Final    Comment:        The GeneXpert MRSA Assay (FDA approved for NASAL specimens only), is one component of a comprehensive MRSA colonization surveillance program. It is not intended to diagnose MRSA infection nor to guide or monitor treatment for MRSA infections. RESULT CALLED TO, READ BACK BY AND VERIFIED WITH: D HART RN 2010 05/12/16 A BROWNING   CSF culture with Stat gram stain     Status: None (Preliminary result)   Collection Time: 05/17/16  9:40 AM  Result Value Ref Range Status   Specimen Description CSF  Final   Special Requests Immunocompromised  Final   Gram Stain NO WBC SEEN NO ORGANISMS SEEN CYTOSPIN SMEAR   Final   Culture PENDING  Incomplete   Report Status PENDING  Incomplete    Michel Bickers, MD [place] [facility] [phone] pager   [phone] cell 05/17/2016, 6:07 PM

## 2016-05-17 NOTE — Progress Notes (Signed)
[facility] Progress Note   Subjective: no new c/o's.    Vitals:   05/17/16 0019 05/17/16 0542 05/17/16 1115 05/17/16 1151  BP: (!) 165/101 121/82 127/69 131/75  Pulse:  (!) 108 (!) 110 (!) 111  Resp:  18  20  Temp:  98.8 F (37.1 C)  98.1 F (36.7 C)  TempSrc:  Oral  Oral  SpO2:  100%  93%  Weight:  69.1 kg (152 lb 4.8 oz)    Height:        Inpatient medications: . abacavir  600 mg Oral Q supper  . acetaminophen  1,000 mg Oral Once  . azithromycin  1,200 mg Oral Weekly  . Chlorhexidine Gluconate Cloth  6 each Topical Q0600  . dapsone  100 mg Oral Daily  . [START ON 05/20/2016] darbepoetin (ARANESP) injection - DIALYSIS  200 mcg Intravenous Q Sat-HD  . darunavir-cobicistat  1 tablet Oral QPM  . dolutegravir  50 mg Oral QPM  . doxercalciferol  6 mcg Intravenous Q T,Th,Sa-HD  . [START ON 05/18/2016] ferric gluconate (FERRLECIT/NULECIT) IV  125 mg Intravenous Q Thu-HD  . heparin  5,000 Units Subcutaneous Q8H  . hydrALAZINE  50 mg Oral TID  . hydrocerin   Topical BID  . levETIRAcetam  250 mg Oral Once per day on Tue Thu Sat  . levETIRAcetam  500 mg Oral Daily  . losartan  100 mg Oral QHS  . metoprolol tartrate  25 mg Oral BID  . multivitamin  1 tablet Oral QHS  . mupirocin ointment  1 application Nasal BID  . NIFEdipine  60 mg Oral Daily  . sodium chloride flush  3 mL Intravenous Q12H  . sucroferric oxyhydroxide  500 mg Oral TID WC  . topiramate  50 mg Oral BID  . topiramate  50 mg Oral Once per day on Tue Thu Sat  . vitamin C  500 mg Oral Daily    sodium chloride, sodium chloride, acetaminophen **OR** acetaminophen, alteplase, camphor-menthol, diphenhydrAMINE, heparin, heparin, hydrALAZINE, hydrocortisone cream, ipratropium-albuterol, lidocaine (PF), lidocaine-prilocaine, pentafluoroprop-tetrafluoroeth, promethazine  Exam: Alert, no distress NO jvd Chest dec'd L base, R clear RRR  Abd soft ntnd Ext no edema  LUE AVF+bruit  Dialysis: [id number] 3h  7mins BFR 450/800 2/2 EDW 65.5kg  -Hectorol 6 mcg IV q HD -Mircera 200 mcg IV 2 weeks (last dose 3/10)  -Venofer 50 mg IV q week (to start 3/22)      Assessment: 1. Headaches - sp LP today, results pend 2. ESRD - cont HD TTS 3. CP/ SOB - resolved, due to #4 4. Vol overload - getting closer to dry wt 5. Nonadherence - an issue w HD 6. HIV/ AIDS- counts are low 7. HTN - losart/ hydral at home and here. Metoprolol and procardia added here. BP's down. 8. Hist of DVT 9. Seizure d/o - pm Le[[ra 10. 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   Plan - HD tomorrow, UF to dry wt; may need to decrease BP meds w volume control   Kelly Splinter MD [facility] pager [phone]   05/17/2016, 2:33 PM    Recent Labs Lab 05/15/16 0517 05/16/16 0408 05/17/16 0454  NA 134* 136 137  K 4.6 4.8 4.4  CL 99* 97* 96*  CO2 24 23 27   GLUCOSE 77 62* 71  BUN 56* 64* 31*  CREATININE 12.49* 14.81* 9.33*  CALCIUM 9.1 9.4 9.5  PHOS 5.9* 6.3* 5.6*    Recent  Labs Lab 05/13/16 0500  05/15/16 0517 05/16/16 0408 05/17/16 0454  AST 23  --   --   --   --   ALT 20  --   --   --   --   ALKPHOS 41  --   --   --   --   BILITOT 0.8  --   --   --   --   PROT 6.8  --   --   --   --   ALBUMIN 2.8*  < > 2.6* 2.7* 3.1*  < > = values in this interval not displayed.  Recent Labs Lab 05/15/16 0517 05/16/16 0408 05/17/16 0454  WBC 6.4 7.5 6.9  HGB 9.6* 10.0* 10.4*  HCT 30.0* 31.5* 32.9*  MCV 91.7 92.1 92.7  PLT 139* 148* 139*   Iron/TIBC/Ferritin/ %Sat    Component Value Date/Time   IRON 59 01/12/2015 1617   TIBC 248 (L) 01/12/2015 1617   FERRITIN 1,050 (H) 02/01/2016 1836   IRONPCTSAT 24 01/12/2015 1617

## 2016-05-17 NOTE — Procedures (Signed)
Lumbar Puncture Procedure Note  Pre-operative Diagnosis: r/o enpceh  Post-operative Diagnosis: r/o enceph  Indications: Diagnostic  Procedure Details   Consent: Informed consent was obtained. Risks of the procedure were discussed including: infection, bleeding, pain and headache.  The patient was positioned under sterile conditions. Betadine solution and sterile drapes were utilized. A spinal needle was inserted at the L3 - L4 interspace.  Spinal fluid was obtained and sent to the laboratory.  Findings slight traumatic that cleared fast  57mL of clear spinal fluid was obtained. Opening Pressure: nromal cm H2O pressure. Closing Pressure: normal cm H2O pressure.  Complications:  None; patient tolerated the procedure well.        Condition: stable  Plan Bed rest for 5 hours.   Lavon Paganini. Titus Mould, MD, [place] Pgr: [facility]

## 2016-05-18 ENCOUNTER — Inpatient Hospital Stay (HOSPITAL_COMMUNITY): Payer: Medicare Other

## 2016-05-18 DIAGNOSIS — R0789 Other chest pain: Secondary | ICD-10-CM

## 2016-05-18 DIAGNOSIS — R079 Chest pain, unspecified: Secondary | ICD-10-CM

## 2016-05-18 DIAGNOSIS — I313 Pericardial effusion (noninflammatory): Secondary | ICD-10-CM

## 2016-05-18 DIAGNOSIS — I10 Essential (primary) hypertension: Secondary | ICD-10-CM

## 2016-05-18 LAB — CBC
HCT: 31.6 % — ABNORMAL LOW (ref 39.0–52.0)
Hemoglobin: 10.2 g/dL — ABNORMAL LOW (ref 13.0–17.0)
MCH: 29.8 pg (ref 26.0–34.0)
MCHC: 32.3 g/dL (ref 30.0–36.0)
MCV: 92.4 fL (ref 78.0–100.0)
Platelets: 189 10*3/uL (ref 150–400)
RBC: 3.42 MIL/uL — ABNORMAL LOW (ref 4.22–5.81)
RDW: 20.3 % — ABNORMAL HIGH (ref 11.5–15.5)
WBC: 7.3 10*3/uL (ref 4.0–10.5)

## 2016-05-18 LAB — BASIC METABOLIC PANEL
Anion gap: 15 (ref 5–15)
BUN: 44 mg/dL — ABNORMAL HIGH (ref 6–20)
CO2: 29 mmol/L (ref 22–32)
Calcium: 9.8 mg/dL (ref 8.9–10.3)
Chloride: 93 mmol/L — ABNORMAL LOW (ref 101–111)
Creatinine, Ser: 11.92 mg/dL — ABNORMAL HIGH (ref 0.61–1.24)
GFR calc Af Amer: 6 mL/min — ABNORMAL LOW (ref >60)
GFR calc non Af Amer: 5 mL/min — ABNORMAL LOW (ref >60)
Glucose, Bld: 85 mg/dL (ref 65–99)
Potassium: 3.9 mmol/L (ref 3.5–5.1)
Sodium: 137 mmol/L (ref 135–145)

## 2016-05-18 LAB — RENAL FUNCTION PANEL
Albumin: 2.7 g/dL — ABNORMAL LOW (ref 3.5–5.0)
Anion gap: 8 (ref 5–15)
BUN: 12 mg/dL (ref 6–20)
CO2: 27 mmol/L (ref 22–32)
Calcium: 8.5 mg/dL — ABNORMAL LOW (ref 8.9–10.3)
Chloride: 99 mmol/L — ABNORMAL LOW (ref 101–111)
Creatinine, Ser: 5.2 mg/dL — ABNORMAL HIGH (ref 0.61–1.24)
GFR calc Af Amer: 15 mL/min — ABNORMAL LOW (ref >60)
GFR calc non Af Amer: 13 mL/min — ABNORMAL LOW (ref >60)
Glucose, Bld: 115 mg/dL — ABNORMAL HIGH (ref 65–99)
Phosphorus: 2.7 mg/dL (ref 2.5–4.6)
Potassium: 4.2 mmol/L (ref 3.5–5.1)
Sodium: 134 mmol/L — ABNORMAL LOW (ref 135–145)

## 2016-05-18 LAB — TROPONIN I
Troponin I: 0.5 ng/mL (ref <0.03)
Troponin I: 0.48 ng/mL (ref <0.03)

## 2016-05-18 MED ORDER — PENTAFLUOROPROP-TETRAFLUOROETH EX AERO: 1.0000 "application " | INHALATION_SPRAY | CUTANEOUS | Status: DC | PRN

## 2016-05-18 MED ORDER — NIFEDIPINE ER OSMOTIC RELEASE 30 MG PO TB24
30.0000 mg | ORAL_TABLET | Freq: Every day | ORAL | Status: DC
  Administered 2016-05-18: 30 mg via ORAL
  Filled 2016-05-18: qty 1

## 2016-05-18 MED ORDER — LIDOCAINE-PRILOCAINE 2.5-2.5 % EX CREA: 1.0000 "application " | TOPICAL_CREAM | CUTANEOUS | Status: DC | PRN

## 2016-05-18 MED ORDER — HEPARIN SODIUM (PORCINE) 1000 UNIT/ML DIALYSIS
1000.0000 [IU] | INTRAMUSCULAR | Status: DC | PRN
  Filled 2016-05-18: qty 1

## 2016-05-18 MED ORDER — SODIUM CHLORIDE 0.9 % IV SOLN: 100.0000 mL | INTRAVENOUS | Status: DC | PRN

## 2016-05-18 MED ORDER — ALTEPLASE 2 MG IJ SOLR: 2.0000 mg | Freq: Once | INTRAMUSCULAR | Status: DC | PRN

## 2016-05-18 MED ORDER — NITROGLYCERIN 2 % TD OINT
1.0000 [in_us] | TOPICAL_OINTMENT | Freq: Four times a day (QID) | TRANSDERMAL | Status: DC
  Administered 2016-05-18: 1 [in_us] via TOPICAL
  Filled 2016-05-18 (×2): qty 30

## 2016-05-18 MED ORDER — DOXERCALCIFEROL 4 MCG/2ML IV SOLN
INTRAVENOUS | Status: AC
  Administered 2016-05-18: 6 ug via INTRAVENOUS
  Filled 2016-05-18: qty 4

## 2016-05-18 MED ORDER — LIDOCAINE HCL (PF) 1 % IJ SOLN: 5.0000 mL | INTRAMUSCULAR | Status: DC | PRN

## 2016-05-18 MED ORDER — NIFEDIPINE ER 60 MG PO TB24
60.0000 mg | ORAL_TABLET | Freq: Every day | ORAL | Status: DC
  Administered 2016-05-19 – 2016-05-21 (×3): 60 mg via ORAL
  Filled 2016-05-18 (×3): qty 1

## 2016-05-18 MED ORDER — SIMETHICONE 80 MG PO CHEW
80.0000 mg | CHEWABLE_TABLET | Freq: Four times a day (QID) | ORAL | Status: DC | PRN
  Filled 2016-05-18: qty 1

## 2016-05-18 MED ORDER — LEVALBUTEROL HCL 0.63 MG/3ML IN NEBU: 0.6300 mg | INHALATION_SOLUTION | Freq: Four times a day (QID) | RESPIRATORY_TRACT | Status: DC | PRN

## 2016-05-18 MED ORDER — CALCIUM CARBONATE ANTACID 500 MG PO CHEW
1.0000 | CHEWABLE_TABLET | Freq: Two times a day (BID) | ORAL | Status: DC | PRN
  Administered 2016-05-18: 200 mg via ORAL
  Filled 2016-05-18: qty 1

## 2016-05-18 MED ORDER — HYDRALAZINE HCL 50 MG PO TABS
50.0000 mg | ORAL_TABLET | Freq: Three times a day (TID) | ORAL | Status: DC
  Administered 2016-05-18 – 2016-05-20 (×4): 50 mg via ORAL
  Filled 2016-05-18 (×4): qty 1

## 2016-05-18 NOTE — Progress Notes (Signed)
Patient complains of chest pressure.  Family at bedside.  Denies shortness of breath.  Vital signs within patient baseline.  MD notified. Marcille Blanco, RN

## 2016-05-18 NOTE — Progress Notes (Addendum)
Nurse received critical lab result for troponin, paged MD waiting for call back. Marcille Blanco, RN   MD notified of critical lab at 1500. Marcille Blanco, RN

## 2016-05-18 NOTE — Progress Notes (Deleted)
[facility] Progress Note   Subjective: no new c/o's.  Has resting sinus tachycardia. He says that his "normal" heart rate is about 110-115.   Vitals:   05/18/16 1100 05/18/16 1130 05/18/16 1200 05/18/16 1223  BP: (!) 141/111 (!) 173/108 (!) 181/109 (!) 175/89  Pulse: (!) 115 (!) 113 (!) 114 (!) 116  Resp: (!) 29 17 15 20   Temp:    98.3 F (36.8 C)  TempSrc:    Oral  SpO2:    99%  Weight:    66.6 kg (146 lb 13.2 oz)  Height:        Inpatient medications: . abacavir  600 mg Oral Q supper  . azithromycin  1,200 mg Oral Weekly  . dapsone  100 mg Oral Daily  . [START ON 05/20/2016] darbepoetin (ARANESP) injection - DIALYSIS  200 mcg Intravenous Q Sat-HD  . darunavir-cobicistat  1 tablet Oral QPM  . dolutegravir  50 mg Oral QPM  . doxercalciferol  6 mcg Intravenous Q T,Th,Sa-HD  . ferric gluconate (FERRLECIT/NULECIT) IV  125 mg Intravenous Q Thu-HD  . heparin  5,000 Units Subcutaneous Q8H  . hydrocerin   Topical BID  . hydrocortisone cream   Topical BID  . levETIRAcetam  250 mg Oral Once per day on Tue Thu Sat  . levETIRAcetam  500 mg Oral Daily  . losartan  100 mg Oral QHS  . metoprolol tartrate  25 mg Oral BID  . multivitamin  1 tablet Oral QHS  . NIFEdipine  30 mg Oral Daily  . sodium chloride flush  3 mL Intravenous Q12H  . sucroferric oxyhydroxide  500 mg Oral TID WC  . topiramate  50 mg Oral BID  . topiramate  50 mg Oral Once per day on Tue Thu Sat  . vitamin C  500 mg Oral Daily    acetaminophen **OR** acetaminophen, camphor-menthol, diphenhydrAMINE, hydrALAZINE, levalbuterol, promethazine  Exam: Alert, no distress NO jvd Chest dec'd L base, R clear RRR  Abd soft ntnd Ext no edema  LUE AVF+bruit  Dialysis: [facility] 3h 35mins BFR 450/800 2/2 EDW 65.5kg  -Hectorol 6 mcg IV q HD -Mircera 200 mcg IV 2 weeks (last dose 3/10)  -Venofer 50 mg IV q week (to start 3/22)      Assessment: 1. Headaches - sp LP today, results negative. Seen by ID,  likely not infectious cause.  2. ESRD - cont HD TTS 3. CP/ SOB - resolved, due to #4 4. Vol overload - better, +1kg after HD today 5. Nonadherence - an issue w HD 6. HIV/ AIDS- counts are low 7. HTN - losart/ hydral at home and here. Metoprolol and procardia added here. BP's still up.  8. Hist of DVT 9. Seizure d/o - pm Le[[ra 10. 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   Plan - HD today, max UF as tolerated   Kelly Splinter MD [facility] pager [phone]   05/18/2016, 2:50 PM    Recent Labs Lab 05/16/16 0408 05/17/16 0454 05/18/16 0424 05/18/16 1342  NA 136 137 137 134*  K 4.8 4.4 3.9 4.2  CL 97* 96* 93* 99*  CO2 23 27 29 27   GLUCOSE 62* 71 85 115*  BUN 64* 31* 44* 12  CREATININE 14.81* 9.33* 11.92* 5.20*  CALCIUM 9.4 9.5 9.8 8.5*  PHOS 6.3* 5.6*  --  2.7    Recent Labs Lab 05/13/16 0500  05/16/16 0408 05/17/16 0454 05/18/16 1342  AST  23  --   --   --   --   ALT 20  --   --   --   --   ALKPHOS 41  --   --   --   --   BILITOT 0.8  --   --   --   --   PROT 6.8  --   --   --   --   ALBUMIN 2.8*  < > 2.7* 3.1* 2.7*  < > = values in this interval not displayed.  Recent Labs Lab 05/16/16 0408 05/17/16 0454 05/18/16 0424  WBC 7.5 6.9 7.3  HGB 10.0* 10.4* 10.2*  HCT 31.5* 32.9* 31.6*  MCV 92.1 92.7 92.4  PLT 148* 139* 189   Iron/TIBC/Ferritin/ %Sat    Component Value Date/Time   IRON 59 01/12/2015 1617   TIBC 248 (L) 01/12/2015 1617   FERRITIN 1,050 (H) 02/01/2016 1836   IRONPCTSAT 24 01/12/2015 1617

## 2016-05-18 NOTE — Consult Note (Signed)
CARDIOLOGY CONSULT NOTE   Patient ID: Jim Wyatt MRN: [id number] DOB/AGE: February 11, 1981 36 y.o.  Admit date: 05/12/2016  Primary Physician   [name], MD Primary Cardiologist  New Reason for Consultation   Chest pain and elevated troponin  Requesting Physician  Dr. Rosalyn Gess   HPI: Jim Wyatt is a 36 y.o. male with a history of ESRD on HD, DVT in 2015, uncontrolled HTN, HIV, GID and seizure  who came to  ER 05/12/16 for progressive worsening of shortness of breath, chest pain and elevated blood pressure. He is also complaining of headache with blurred vision. CT angiogram of chest showed no PE, small to moderate size pericardial effusion. Echocardiogram showed left ventricular function of 60-65%, no wall motion abnormality, moderate pericardial effusion without evidence of hemodynamic compromise. Her pressure improved with the medication. It was felt that his pericardial and pleural effusion contributing to SOB, likely related to inadequate dialysis (non compliant). CT of the head without acute finding, patient refused MRI of brain. Headache somewhat improved on Topamax. He  was seen by ID due to extremely low CD4 count of 4. LP showing normal CSF cell count and negative cryptococcal ag.  Patient has developed a substernal chest pressure this morning. Troponin level came back as 0.5. On admission troponin level 0.16-->0.18-->0.15. EKG shows sinus tachycardia at rate of 118's per minute. No acute finding. Repeat CXR showed Congestive heart failure with bilateral pulmonary interstitial edema. Still  complaining of 4 out of 10 chest pressure. Elevated blood pressure to 170s over 80s  Denies history of illicit drug use or tobacco abuse. No family history of CAD  Past Medical History:  Diagnosis Date  . Asthma   . CAP (community acquired pneumonia) 10/2013  . Cellulitis 11/2013   LLE  . DVT (deep venous thrombosis) ([place]) 10/2013   BLE  . 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 Went on HD Dec 2016.   [name] Fever   . Headache   . HIV infection ([place]) dx'd 10/2013  . Hypertension   . Leg pain   . Multiple environmental allergies    "trees, dogs, cats"  . Multiple food allergies   . Shingles < 2010  . 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 AV FISTULA PLACEMENT Left 03/03/2015   Procedure: Creation of Left Arm RADIOCEPHALIC ARTERIOVENOUS FISTULA ;  Surgeon: Mal Misty, MD;  Location: [place];  Service: Vascular;  Laterality: Left;  . COLONOSCOPY N/A 03/18/2016   Procedure: COLONOSCOPY;  Surgeon: Manus Gunning, MD;  Location: [place];  Service: Gastroenterology;  Laterality: N/A;  . FEMUR FRACTURE SURGERY Right 09/2001  . FRACTURE SURGERY    . I&D EXTREMITY Right 12/18/2014   Procedure: IRRIGATION AND DEBRIDEMENT RIGHT LOWER EXTREMITY;  Surgeon: Ralene Ok, MD;  Location: [place];  Service: General;  Laterality: Right;  . INSERTION OF DIALYSIS CATHETER  2016  . LAPAROTOMY N/A 12/28/2015   Procedure: EXPLORATORY LAPAROTOMY BOWEL RESECTION ILEOCECECTOMY;  Surgeon: Clovis Riley, MD;  Location: [place];  Service: General;  Laterality: N/A;    Allergies  Allergen Reactions  . Bactrim [Sulfamethoxazole-Trimethoprim] Other (See Comments)    Renal failure  . Reglan [Metoclopramide] Other (See Comments)    Causes ticks/ jerks  . Vancomycin Other (See Comments)    Patient states vancomycin caused kidney injury  . Amlodipine Palpitations and Other (See Comments)  fatigue  . Augmentin [Amoxicillin-Pot Clavulanate] Hives    I have reviewed the patient's current medications . abacavir  600 mg Oral Q supper  . azithromycin  1,200 mg Oral Weekly  . dapsone  100 mg Oral Daily  . [START ON 05/20/2016] darbepoetin (ARANESP) injection - DIALYSIS  200 mcg Intravenous Q Sat-HD  . darunavir-cobicistat  1 tablet Oral QPM  . dolutegravir  50 mg  Oral QPM  . doxercalciferol  6 mcg Intravenous Q T,Th,Sa-HD  . ferric gluconate (FERRLECIT/NULECIT) IV  125 mg Intravenous Q Thu-HD  . heparin  5,000 Units Subcutaneous Q8H  . hydrocerin   Topical BID  . hydrocortisone cream   Topical BID  . levETIRAcetam  250 mg Oral Once per day on Tue Thu Sat  . levETIRAcetam  500 mg Oral Daily  . losartan  100 mg Oral QHS  . metoprolol tartrate  25 mg Oral BID  . multivitamin  1 tablet Oral QHS  . NIFEdipine  30 mg Oral Daily  . sodium chloride flush  3 mL Intravenous Q12H  . sucroferric oxyhydroxide  500 mg Oral TID WC  . topiramate  50 mg Oral BID  . topiramate  50 mg Oral Once per day on Tue Thu Sat  . vitamin C  500 mg Oral Daily    acetaminophen **OR** acetaminophen, camphor-menthol, diphenhydrAMINE, hydrALAZINE, levalbuterol, promethazine  Prior to Admission medications   Medication Sig Start Date End Date Taking? Authorizing Provider  abacavir (ZIAGEN) 300 MG tablet Take 2 tablets (600 mg total) by mouth daily with supper. 12/14/15  Yes Carlyle Basques, MD  acetaminophen (TYLENOL) 500 MG tablet Take 1,000 mg by mouth every 6 (six) hours as needed (pain).   Yes Historical Provider, MD  albuterol (PROVENTIL HFA;VENTOLIN HFA) 108 (90 Base) MCG/ACT inhaler Inhale 2 puffs into the lungs every 6 (six) hours as needed for wheezing or shortness of breath. 04/19/16  Yes [name], MD  Ascorbic Acid (VITAMIN C) 250 MG CHEW Chew 500 mg by mouth daily.    Yes Historical Provider, MD  azithromycin (ZITHROMAX) 600 MG tablet Take 1,200 mg by mouth once a week.    Yes Historical Provider, MD  dapsone 100 MG tablet Take 1 tablet (100 mg total) by mouth daily. 11/24/15  Yes Alexa Angela Burke, MD  darunavir-cobicistat (PREZCOBIX) 800-150 MG tablet Take 1 tablet by mouth every evening. Swallow whole. Do NOT crush, break or chew tablets. Take with food. 12/14/15  Yes Carlyle Basques, MD  dolutegravir (TIVICAY) 50 MG tablet Take 1 tablet (50 mg total) by mouth  every evening. 12/14/15  Yes Carlyle Basques, MD  hydrALAZINE (APRESOLINE) 100 MG tablet Take 0.5 tablets (50 mg total) by mouth 3 (three) times daily. 6:30am, 2:30pm, 11pm - for high blood pressure Patient taking differently: Take 100 mg by mouth 3 (three) times daily. 6:30am, 2:30pm, 11pm - for high blood pressure  03/21/16  Yes Burgess Estelle, MD  levETIRAcetam (KEPPRA) 250 MG tablet Take 1 tablet (250 mg) by mouth on Tuesday, Thursday, Saturday at 7pm Patient taking differently: Take 250-500 mg by mouth See admin instructions. takes extra 250mg  on dialysis days Wallie Char, Sunday 03/13/16  Yes Cameron Sprang, MD  levETIRAcetam (KEPPRA) 500 MG tablet Take 1 tablet (500 mg total) by mouth daily. 03/21/16  Yes Burgess Estelle, MD  losartan (COZAAR) 100 MG tablet Take 100 mg by mouth at bedtime.   Yes Historical Provider, MD  ondansetron (ZOFRAN ODT) 8 MG disintegrating tablet Take 1 tablet (  8 mg total) by mouth every 8 (eight) hours as needed for nausea or vomiting. Patient not taking: Reported on 05/12/2016 04/03/16   Carlyle Basques, MD  triamcinolone (KENALOG) 0.025 % ointment Apply 1 application topically 2 (two) times daily. Do not apply to face Patient not taking: Reported on 05/09/2016 04/27/16   Antonietta Breach, PA-C  venlafaxine XR (EFFEXOR-XR) 150 MG 24 hr capsule TAKE 1 CAPSULE(150 MG) BY MOUTH DAILY Patient not taking: Reported on 05/12/2016 04/19/16   Holley Raring, MD     Social History   Social History  . Marital status: Single    Spouse name: N/A  . Number of children: N/A  . Years of education: N/A   Occupational History  . Not on file.   Social History Main Topics  . Smoking status: Never Smoker  . Smokeless tobacco: Never Used  . Alcohol use No     Comment: I stopped drinking along time ago "  . Drug use: No  . Sexual activity: Not on file   Other Topics Concern  . Not on file   Social History Narrative  . No narrative on file    Family Status  Relation Status  . Mother Alive   . Father Alive  . Maternal Grandmother    Family History  Problem Relation Age of Onset  . Hypertension Mother   . Diabetes Maternal Grandmother   . Kidney disease Maternal Grandmother       ROS:  Full 14 point review of systems complete and found to be negative unless listed above.  Physical Exam: Blood pressure (!) 175/89, pulse (!) 116, temperature 98.3 F (36.8 C), temperature source Oral, resp. rate 20, height 5\' 7"  (1.702 m), weight 146 lb 13.2 oz (66.6 kg), SpO2 99 %.  General:Weekly ill-appearing  male in no acute distress Head: Eyes PERRLA, No xanthomas. Normocephalic and atraumatic, oropharynx without edema or exudate.  Lungs: Resp regular and unlabored, left lower lobe crackles.  Heart: RRR no s3, s4, or murmurs. Visible PMI Neck: No carotid bruits. No lymphadenopathy. No  JVD. Abdomen: Bowel sounds present, abdomen soft and non-tender without masses or hernias noted. Msk:  No spine or cva tenderness. No weakness, no joint deformities or effusions. Extremities: No clubbing, cyanosis or edema. DP/PT/Radials 2+ and equal bilaterally. Neuro: Alert and oriented X 3. No focal deficits noted. Psych:  Good affect, responds appropriately Skin: No rashes or lesions noted.  Labs:   Lab Results  Component Value Date   WBC 7.3 05/18/2016   HGB 10.2 (L) 05/18/2016   HCT 31.6 (L) 05/18/2016   MCV 92.4 05/18/2016   PLT 189 05/18/2016    Recent Labs  05/16/16 0954  INR 0.91    Recent Labs Lab 05/13/16 0500  05/18/16 1342  NA  --   < > 134*  K  --   < > 4.2  CL  --   < > 99*  CO2  --   < > 27  BUN  --   < > 12  CREATININE  --   < > 5.20*  CALCIUM  --   < > 8.5*  PROT 6.8  --   --   BILITOT 0.8  --   --   ALKPHOS 41  --   --   ALT 20  --   --   AST 23  --   --   GLUCOSE  --   < > 115*  ALBUMIN 2.8*  < > 2.7*  < > =  values in this interval not displayed. Magnesium  Date Value Ref Range Status  07/30/2014 1.6 (L) 1.7 - 2.4 mg/dL Final    Recent Labs   05/18/16 1342  TROPONINI 0.50*   No results for input(s): TROPIPOC in the last 72 hours. Pro B Natriuretic peptide (BNP)  Date/Time Value Ref Range Status  11/07/2013 08:55 AM 33.5 0 - 125 pg/mL Final   Lab Results  Component Value Date   CHOL 100 11/15/2013   HDL 8 (L) 11/15/2013   LDLCALC 27 11/15/2013   TRIG 327 (H) 11/15/2013   Lab Results  Component Value Date   DDIMER 3.60 (H) 05/12/2016   Lipase  Date/Time Value Ref Range Status  02/01/2016 08:21 AM 52 (H) 11 - 51 U/L Final   No results found for: TSH, T4TOTAL, T3FREE, THYROIDAB Vitamin B-12  Date/Time Value Ref Range Status  01/12/2015 04:17 PM 1,187 (H) 180 - 914 pg/mL Final    Comment:    (NOTE) This assay is not validated for testing neonatal or myeloproliferative syndrome specimens for Vitamin B12 levels.    Folate  Date/Time Value Ref Range Status  01/12/2015 04:12 PM 10.6 >5.9 ng/mL Final   Ferritin  Date/Time Value Ref Range Status  02/01/2016 06:36 PM 1,050 (H) 24 - 336 ng/mL Final   TIBC  Date/Time Value Ref Range Status  01/12/2015 04:17 PM 248 (L) 250 - 450 ug/dL Final   Iron  Date/Time Value Ref Range Status  01/12/2015 04:17 PM 59 45 - 182 ug/dL Final   Retic Ct Pct  Date/Time Value Ref Range Status  03/19/2016 06:57 PM 6.4 (H) 0.4 - 3.1 % Final   Echo: 05/13/16 Study Conclusions  - Left ventricle: The cavity size was normal. Wall thickness was   increased in a pattern of severe LVH. Systolic function was   normal. The estimated ejection fraction was in the range of 60%   to 65%. Wall motion was normal; there were no regional wall   motion abnormalities. - Left atrium: The atrium was severely dilated. - Right atrium: The atrium was mildly dilated. - Pericardium, extracardiac: A moderate pericardial effusion was   identified circumferential to the heart. There was no evidence of   hemodynamic compromise.   Radiology:  Dg Chest 2 View  Result Date: 05/18/2016 CLINICAL DATA:   Shortness of breath. EXAM: CHEST  2 VIEW COMPARISON:  05/14/2016 . FINDINGS: Cardiomegaly with mild bilateral interstitial prominence. Findings suggest mild CHF. No pleural effusion or pneumothorax. No acute bony abnormality identified. IMPRESSION: Congestive heart failure with bilateral pulmonary interstitial edema . Electronically Signed   By: Marcello Moores  Register   On: 05/18/2016 15:09   Ct Abdomen Pelvis W Contrast  Result Date: 05/17/2016 CLINICAL DATA:  Acute onset of generalized abdominal pain. Initial encounter. EXAM: CT ABDOMEN AND PELVIS WITH CONTRAST TECHNIQUE: Multidetector CT imaging of the abdomen and pelvis was performed using the standard protocol following bolus administration of intravenous contrast. CONTRAST:  129mL ISOVUE-300 IOPAMIDOL (ISOVUE-300) INJECTION 61% COMPARISON:  CT of the abdomen and pelvis from 03/16/2016 FINDINGS: Lower chest: A trace left pleural effusion is noted, with associated atelectasis. The heart is mildly enlarged. A small pericardial effusion is noted. Hepatobiliary: The liver is unremarkable in appearance. The gallbladder is within normal limits. The common bile duct remains normal in caliber. Pancreas: The pancreas is within normal limits. Spleen: The spleen is unremarkable in appearance. Adrenals/Urinary Tract: The adrenal glands are unremarkable in appearance. Mild bilateral renal atrophy is noted, with nonspecific perinephric  edema. There is no evidence of hydronephrosis. No renal or ureteral stones are seen. Stomach/Bowel: Postoperative change is noted along the ascending colon. The colon is grossly unremarkable in appearance. The small bowel is grossly unremarkable, though difficult to fully assess given the lack of intraperitoneal fat. Nonspecific edema is seen tracking within the abdomen and pelvis. The stomach is partially filled with solid material and is unremarkable in appearance. Vascular/Lymphatic: The abdominal aorta is unremarkable in appearance. The  inferior vena cava is grossly unremarkable. No retroperitoneal lymphadenopathy is seen. No pelvic sidewall lymphadenopathy is identified. Reproductive: The bladder is decompressed and not well assessed. The prostate remains normal in size. Other: Diffuse soft tissue edema is seen tracking along the abdominal and pelvic wall. Musculoskeletal: No acute osseous abnormalities are identified. The visualized musculature is unremarkable in appearance. IMPRESSION: 1. No acute abnormality seen to explain the patient's symptoms. 2. Mild diffuse intraperitoneal and retroperitoneal soft tissue edema within the abdomen and pelvis. 3. Diffuse soft tissue edema along the abdominal wall raises question for mild anasarca. 4. Mild bilateral renal atrophy. 5. Trace left pleural effusion, with associated atelectasis. 6. Mild cardiomegaly.  Small pericardial effusion. Electronically Signed   By: Garald Balding M.D.   On: 05/17/2016 03:19    ASSESSMENT AND PLAN:     1. Chest pain with elevated troponin - Recent episode started this morning. His pain is constant. On presentation his troponin trend was flat to 0.15. Repeat troponin today 0.5. Echo this admission showed normal EF with moderate pericardial effusion.  - His symptoms in setting up uncontrolled blood pressure and end-stage renal disease. Start nitro paste.   2. Pericardial effusion - Moderate on echo. Felt due to noncompliance with dialysis. He is not hemodynamically compromised given his vital signs. Will get limited echo evaluate with definity.   3. Uncontrolled HTN - BP elevated. Continue current medication. As above.    SignedLeanor Kail, PA 05/18/2016, 4:19 PM Pager [phone]  Pt seen and examined  I agree with findings as noted by B Bhagat above  Briefly pt is a 36 yo with ESRD, HIV, HTN admttied on 3/16 with SOB, CP and hypertension Pt had been noncompliant with dialysis   Found to have mod PE on CT  Echo showed Nomal LVEF  Mod effusion.   Pt  has undergon dialysis.  Today developed CP  Trop 0.5  Was 0.18 peak on admit  Currenty pain free  Is back from dialysis ON exam, pt a thin 36 yo in NAD  JVP normal  Lungs are rel clear  Chest nontender.  Abd benign  Ext without edema   Trop elevation may represent subendocardial ischemia in setting of LVH, hypertension and ESRD I have reviewed echo  Poor acoustic windows limiting Difficult to evla regional wall motion  I would recomm limited echo to 1 eval pericardial effusion and 2 eval LVEF and wall motion   Will review  I would add NTG paste to regimen to improve BP control     Will continue to follow   Jim Wyatt

## 2016-05-18 NOTE — Progress Notes (Signed)
Pt's mother now has concerns about black mold in the patient's living situation possibly causing some of his current headache and fatigue symptoms. Pt is renting a house with a roommate in [place], but is planning to move in May. Pt's mother states she found the black mold in his bathroom and there may be more in other parts of that house.

## 2016-05-18 NOTE — Progress Notes (Signed)
[facility] Progress Note   Subjective: no new c/o's.  Has resting sinus tachycardia. He says that his "normal" heart rate is about 110-115.   Vitals:   05/18/16 1100 05/18/16 1130 05/18/16 1200 05/18/16 1223  BP: (!) 141/111 (!) 173/108 (!) 181/109 (!) 175/89  Pulse: (!) 115 (!) 113 (!) 114 (!) 116  Resp: (!) 29 17 15 20   Temp:    98.3 F (36.8 C)  TempSrc:    Oral  SpO2:    99%  Weight:    66.6 kg (146 lb 13.2 oz)  Height:        Inpatient medications: . abacavir  600 mg Oral Q supper  . azithromycin  1,200 mg Oral Weekly  . dapsone  100 mg Oral Daily  . [START ON 05/20/2016] darbepoetin (ARANESP) injection - DIALYSIS  200 mcg Intravenous Q Sat-HD  . darunavir-cobicistat  1 tablet Oral QPM  . dolutegravir  50 mg Oral QPM  . doxercalciferol  6 mcg Intravenous Q T,Th,Sa-HD  . ferric gluconate (FERRLECIT/NULECIT) IV  125 mg Intravenous Q Thu-HD  . heparin  5,000 Units Subcutaneous Q8H  . hydrocerin   Topical BID  . hydrocortisone cream   Topical BID  . levETIRAcetam  250 mg Oral Once per day on Tue Thu Sat  . levETIRAcetam  500 mg Oral Daily  . losartan  100 mg Oral QHS  . metoprolol tartrate  25 mg Oral BID  . multivitamin  1 tablet Oral QHS  . NIFEdipine  30 mg Oral Daily  . sodium chloride flush  3 mL Intravenous Q12H  . sucroferric oxyhydroxide  500 mg Oral TID WC  . topiramate  50 mg Oral BID  . topiramate  50 mg Oral Once per day on Tue Thu Sat  . vitamin C  500 mg Oral Daily    acetaminophen **OR** acetaminophen, camphor-menthol, diphenhydrAMINE, hydrALAZINE, levalbuterol, promethazine  Exam: Alert, no distress NO jvd Chest dec'd L base, R clear RRR  Abd soft ntnd Ext no edema  LUE AVF+bruit  Dialysis: [facility] 3h 50mins BFR 450/800 2/2 EDW 65.5kg  -Hectorol 6 mcg IV q HD -Mircera 200 mcg IV 2 weeks (last dose 3/10)  -Venofer 50 mg IV q week (to start 3/22)      Assessment: 1. Headaches - sp LP today, results negative. Seen by ID,  likely not infectious cause.  2. ESRD - cont HD TTS 3. CP/ SOB - resolved, due to #4 4. Vol overload - better, +1kg after HD today 5. Nonadherence - an issue w HD 6. HIV/ AIDS- counts are low 7. HTN - losart/ hydral at home and here. Metoprolol and procardia added here. BP's still up.  8. Hist of DVT 9. Seizure d/o - pm Le[[ra 10. 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   Plan - HD today, max UF as tolerated   Kelly Splinter MD [facility] pager [phone]   05/18/2016, 3:01 PM    Recent Labs Lab 05/16/16 0408 05/17/16 0454 05/18/16 0424 05/18/16 1342  NA 136 137 137 134*  K 4.8 4.4 3.9 4.2  CL 97* 96* 93* 99*  CO2 23 27 29 27   GLUCOSE 62* 71 85 115*  BUN 64* 31* 44* 12  CREATININE 14.81* 9.33* 11.92* 5.20*  CALCIUM 9.4 9.5 9.8 8.5*  PHOS 6.3* 5.6*  --  2.7    Recent Labs Lab 05/13/16 0500  05/16/16 0408 05/17/16 0454 05/18/16 1342  AST  23  --   --   --   --   ALT 20  --   --   --   --   ALKPHOS 41  --   --   --   --   BILITOT 0.8  --   --   --   --   PROT 6.8  --   --   --   --   ALBUMIN 2.8*  < > 2.7* 3.1* 2.7*  < > = values in this interval not displayed.  Recent Labs Lab 05/16/16 0408 05/17/16 0454 05/18/16 0424  WBC 7.5 6.9 7.3  HGB 10.0* 10.4* 10.2*  HCT 31.5* 32.9* 31.6*  MCV 92.1 92.7 92.4  PLT 148* 139* 189   Iron/TIBC/Ferritin/ %Sat    Component Value Date/Time   IRON 59 01/12/2015 1617   TIBC 248 (L) 01/12/2015 1617   FERRITIN 1,050 (H) 02/01/2016 1836   IRONPCTSAT 24 01/12/2015 1617

## 2016-05-18 NOTE — Progress Notes (Signed)
Pt continues to refuse PRN/scheduled Hydrocortisone cream despite claiming to have a rash/pruritis. Pt is however using the Eucerin cream at his bedside and states that it is starting to work. Hydrocortisone cream left at pt's bedside and RN has encouraged use. Will continue to monitor.

## 2016-05-18 NOTE — Progress Notes (Signed)
Family Medicine Teaching Service Daily Progress Note Intern Pager: [phone]  Patient name: Jim Wyatt Medical record number: [id number] Date of birth: [birth year] Age: 36 y.o. Gender: male  Primary Care Provider: [name], MD Consultants: Nephro  Code Status: FULL   Pt Overview and Major Events to Date:  3/16: Admitted to [facility]  3/17: HD   Assessment and Plan: Jim Wyatt is a 36 y.o. male presenting with one day of worsening shortness of breath with changes in vision. PMH is significant for poorly controlled hypertension, HIV, end-stage renal disease on dialysis (T/TH/S) and history of DVT x 1 in 2015 (not on any anti-coagulation).  Headache, ongoing with resolved changes in vision:  HA rated 3/10 this morning without vision changes. BP to 113/69 this AM.  LP showing normal CSF cell count and negative cryptococcal ag. Patient asked about receiving dilauded at outpatient center. Recommended that he ask nephrologist about this. Last night told nurse about black mold being in his home and wondering if this might be contributing to his symptoms. Fortunately he is moving in May.  - f/u CSF culture and toxoplasmosis ag - ID consulted; appreciate assistance.  - tylenol PRN headache - neuro checks q4hrs - cont topamax  Shortness of breath with chest pain, ongoing:   Reported CP this morning and ordered STAT ekg and troponin. Continues to endorse SOB and is currently satting 100% on 3LNC.  Persistent crackles in mid-lower lung fields on L side.  - repeat CXR - oxpenex PRN, given tachycardia - wean O2 as able - telemetry and continuous pulse ox  - HIV RNA quant - continue home azithromycin (once per week on Monday) and dapsone for HIV opportunistic infection prophylaxis  Renovascular hypertension with hypertensive urgency, improving:  BP to 113/69 this AM and denies vision changes.  Will adjust BP meds as below.  - Holding hydralazine given low BP prior to dialysis this  AM - cont losartan 100 mg daily- will consider holding this PM if continued low BP - decreased nifedipine to 30mg  daily added by nephrology; and cont metoprolol 25 mg BID  - hydralazine 5mg  IV PRN for SBP >180 or DBP >110 - continuous cardiac monitoring  End-stage renal disease: Potassium to 3.9. Will have dialysis today. BP on the lower side, will make changes to BP meds as above.  - plan for HD 3/22 - nephrology consulted;appreciate assistance - daily renal function panel - palliative c/s- patient wishes to continue receiving dialysis, but will stop if becomes too painful  HIV disease/advanced AIDS: CD4 count to 4 and viral load pending. Seen by ID who recommended continuing with current regimen and following up with RNA quant/genotype labs and f/u with Dr. Graylon Good on 05/30/16. LP yesterday showing normal cell count and negative cyptococcal ag in CSF.  - f/u CSF culture - continue abicavir 300mg  BID, dapsone 100mg  and tivicar 50mg  daily - f/u genosure integrase and RNA ultraquant reflex to genotype and RNA quant  Abd pain, stable:  CT abd showing non-specific edema. Continues with appropriate appetite.  -continue to monitor  Seizure disorder, stable: Last seizure in January. Has only had 2 seizures in his lifetime. Taking keppra every day.  - continue keppra 500mg  daily and an additional 250mg  on dialysis days (T/TH/S) - discontinued tramadol  Patient concern for OSA: Supposed to have sleep study at the end of this month. History of obesity, but has lost >50 pounds d/t renal disease and HIV/advanced AIDS. Does not currently use CPAP.  - sleep  study as an outpatient  Asthma: Takes albuterol. Has reportedly been using it around four times daily recently for shortness of breath. Denies tobacco.  - duonebs PRN - consider controller med - outpatient PFTs  FEN/GI: renal diet with fluid restriction Prophylaxis: subcutaneous heparin  Disposition: pending improvement with headache and  infectious r/o  Subjective:  Complaining of chest discomfort this morning described as pressure that does not radiate. Denies abd pain, nausea or vomiting. Does endorse SOB, but does not have increased WOB.  Objective: Temp:  [98.1 F (36.7 C)-98.6 F (37 C)] 98.6 F (37 C) (03/22 0508) Pulse Rate:  [76-122] 122 (03/22 0628) Resp:  [16-20] 16 (03/22 0508) BP: (113-168)/(69-88) 113/69 (03/22 0508) SpO2:  [93 %-100 %] 95 % (03/22 0628) Weight:  [155 lb 9.6 oz (70.6 kg)] 155 lb 9.6 oz (70.6 kg) (03/22 0508) Physical Exam: General: lying in bed, NAD with [place] in place Cardiovascular: tachycardic, RR, 2+ palpable pulses Respiratory: NWOB, crackles in mid-lower lung fields on L side, no rhonchi or wheezing Abdomen: +BS, Soft, NTND  Extremities: No LE edema Neuro: CN II-XII intact. Strength 5/5 in all extremities. Normal speech and though content.   Laboratory:  Recent Labs Lab 05/16/16 0408 05/17/16 0454 05/18/16 0424  WBC 7.5 6.9 7.3  HGB 10.0* 10.4* 10.2*  HCT 31.5* 32.9* 31.6*  PLT 148* 139* 189    Recent Labs Lab 05/13/16 0500  05/16/16 0408 05/17/16 0454 05/18/16 0424  NA  --   < > 136 137 137  K  --   < > 4.8 4.4 3.9  CL  --   < > 97* 96* 93*  CO2  --   < > 23 27 29   BUN  --   < > 64* 31* 44*  CREATININE  --   < > 14.81* 9.33* 11.92*  CALCIUM  --   < > 9.4 9.5 9.8  PROT 6.8  --   --   --   --   BILITOT 0.8  --   --   --   --   ALKPHOS 41  --   --   --   --   ALT 20  --   --   --   --   AST 23  --   --   --   --   GLUCOSE  --   < > 62* 71 85  < > = values in this interval not displayed.   Imaging/Diagnostic Tests: Dg Chest 2 View  Result Date: 05/12/2016 CLINICAL DATA:  Shortness of breath and chest pain EXAM: CHEST  2 VIEW COMPARISON:  04/27/2016 FINDINGS: Cardiac shadow remains enlarged. Mild increased vascular congestion is noted without significant edema or effusion. No focal infiltrate is identified. No bony abnormality is seen. IMPRESSION: Changes  consistent with mild central vascular congestion. Electronically Signed   By: Inez Catalina M.D.   On: 05/12/2016 08:11   Ct Head Wo Contrast  Result Date: 05/09/2016 CLINICAL DATA:  Headaches EXAM: CT HEAD WITHOUT CONTRAST TECHNIQUE: Contiguous axial images were obtained from the base of the skull through the vertex without intravenous contrast. COMPARISON:  01/17/2016 FINDINGS: Brain: No evidence of acute infarction, hemorrhage, hydrocephalus, extra-axial collection or mass lesion/mass effect. Vascular: No hyperdense vessel or unexpected calcification. Skull: Normal. Negative for fracture or focal lesion. Sinuses/Orbits: Mucosal thickening is noted in the maxillary antra bilaterally stable from the previous exam. Other: None IMPRESSION: No acute intracranial abnormality noted. Mucosal thickening in the maxillary antra bilaterally. Electronically  Signed   By: Inez Catalina M.D.   On: 05/09/2016 14:25   Ct Angio Chest Pe W Or Wo Contrast  Result Date: 05/12/2016 CLINICAL DATA:  Chest pain and shortness of breath since yesterday. EXAM: CT ANGIOGRAPHY CHEST WITH CONTRAST TECHNIQUE: Multidetector CT imaging of the chest was performed using the standard protocol during bolus administration of intravenous contrast. Multiplanar CT image reconstructions and MIPs were obtained to evaluate the vascular anatomy. CONTRAST:  180 cc of Isovue 370 COMPARISON:  Chest radiographs dated 05/12/2016. FINDINGS: Cardiovascular: Initial images obtained with 100 cc of Isovue 370 did not demonstrate adequate opacification of the pulmonary arteries. Therefore, repeat images were obtained with an additional 80 cc of Isovue 370. These demonstrate normally opacified pulmonary arteries with no pulmonary arterial filling defects. The heart is enlarged. The pericardial effusion is demonstrated, measuring 1.9 cm in maximum thickness. Mediastinum/Nodes: No enlarged mediastinal, hilar, or axillary lymph nodes. Thyroid gland, trachea, and  esophagus demonstrate no significant findings. Lungs/Pleura: Prominent pulmonary vasculature. Small left pleural effusion. Upper Abdomen: No acute abnormality. Musculoskeletal: Minimal thoracic spine degenerative changes. Review of the MIP images confirms the above findings. IMPRESSION: 1. No pulmonary emboli. 2. Small to moderate-sized pericardial effusion. 3. Small left pleural effusion. 4. Cardiomegaly and pulmonary vascular congestion. Electronically Signed   By: Claudie Revering M.D.   On: 05/12/2016 16:03   Eloise Levels, MD 05/18/2016, 7:29 AM PGY-1, [place] Intern pager: [phone], text pages welcome

## 2016-05-19 ENCOUNTER — Inpatient Hospital Stay (HOSPITAL_COMMUNITY): Payer: Medicare Other

## 2016-05-19 DIAGNOSIS — I10 Essential (primary) hypertension: Secondary | ICD-10-CM

## 2016-05-19 DIAGNOSIS — R079 Chest pain, unspecified: Secondary | ICD-10-CM

## 2016-05-19 DIAGNOSIS — I313 Pericardial effusion (noninflammatory): Secondary | ICD-10-CM

## 2016-05-19 LAB — RENAL FUNCTION PANEL
Albumin: 2.8 g/dL — ABNORMAL LOW (ref 3.5–5.0)
Anion gap: 10 (ref 5–15)
BUN: 21 mg/dL — ABNORMAL HIGH (ref 6–20)
CO2: 27 mmol/L (ref 22–32)
Calcium: 9.7 mg/dL (ref 8.9–10.3)
Chloride: 100 mmol/L — ABNORMAL LOW (ref 101–111)
Creatinine, Ser: 8.19 mg/dL — ABNORMAL HIGH (ref 0.61–1.24)
GFR calc Af Amer: 9 mL/min — ABNORMAL LOW (ref >60)
GFR calc non Af Amer: 8 mL/min — ABNORMAL LOW (ref >60)
Glucose, Bld: 77 mg/dL (ref 65–99)
Phosphorus: 4.8 mg/dL — ABNORMAL HIGH (ref 2.5–4.6)
Potassium: 5.2 mmol/L — ABNORMAL HIGH (ref 3.5–5.1)
Sodium: 137 mmol/L (ref 135–145)

## 2016-05-19 LAB — CBC
HCT: 33.9 % — ABNORMAL LOW (ref 39.0–52.0)
Hemoglobin: 10.5 g/dL — ABNORMAL LOW (ref 13.0–17.0)
MCH: 29 pg (ref 26.0–34.0)
MCHC: 31 g/dL (ref 30.0–36.0)
MCV: 93.6 fL (ref 78.0–100.0)
Platelets: 176 10*3/uL (ref 150–400)
RBC: 3.62 MIL/uL — ABNORMAL LOW (ref 4.22–5.81)
RDW: 19.8 % — ABNORMAL HIGH (ref 11.5–15.5)
WBC: 10.9 10*3/uL — ABNORMAL HIGH (ref 4.0–10.5)

## 2016-05-19 LAB — TROPONIN I: Troponin I: 0.4 ng/mL (ref <0.03)

## 2016-05-19 MED ORDER — HEPARIN SODIUM (PORCINE) 1000 UNIT/ML DIALYSIS: 1000.0000 [IU] | INTRAMUSCULAR | Status: DC | PRN

## 2016-05-19 MED ORDER — PENTAFLUOROPROP-TETRAFLUOROETH EX AERO: 1.0000 "application " | INHALATION_SPRAY | CUTANEOUS | Status: DC | PRN

## 2016-05-19 MED ORDER — SODIUM CHLORIDE 0.9 % IV SOLN: 100.0000 mL | INTRAVENOUS | Status: DC | PRN

## 2016-05-19 MED ORDER — PERFLUTREN LIPID MICROSPHERE
1.0000 mL | INTRAVENOUS | Status: AC | PRN
  Administered 2016-05-19: 2 mL via INTRAVENOUS
  Filled 2016-05-19: qty 10

## 2016-05-19 MED ORDER — METOPROLOL TARTRATE 25 MG PO TABS
25.0000 mg | ORAL_TABLET | Freq: Four times a day (QID) | ORAL | Status: DC
  Administered 2016-05-19: 25 mg via ORAL
  Filled 2016-05-19 (×2): qty 1

## 2016-05-19 MED ORDER — LIDOCAINE HCL (PF) 1 % IJ SOLN: 5.0000 mL | INTRAMUSCULAR | Status: DC | PRN

## 2016-05-19 MED ORDER — HYDRALAZINE HCL 20 MG/ML IJ SOLN
INTRAMUSCULAR | Status: AC
  Filled 2016-05-19: qty 1

## 2016-05-19 MED ORDER — ALTEPLASE 2 MG IJ SOLR: 2.0000 mg | Freq: Once | INTRAMUSCULAR | Status: DC | PRN

## 2016-05-19 MED ORDER — LIDOCAINE-PRILOCAINE 2.5-2.5 % EX CREA: 1.0000 "application " | TOPICAL_CREAM | CUTANEOUS | Status: DC | PRN

## 2016-05-19 NOTE — Progress Notes (Signed)
Progress Note  Patient Name: Jim Wyatt Date of Encounter: 05/19/2016  Primary Cardiologist:   New    Subjective   NO CP  Breathing is OK  Bad HA    Inpatient Medications    Scheduled Meds: . abacavir  600 mg Oral Q supper  . azithromycin  1,200 mg Oral Weekly  . dapsone  100 mg Oral Daily  . [START ON 05/20/2016] darbepoetin (ARANESP) injection - DIALYSIS  200 mcg Intravenous Q Sat-HD  . darunavir-cobicistat  1 tablet Oral QPM  . dolutegravir  50 mg Oral QPM  . doxercalciferol  6 mcg Intravenous Q T,Th,Sa-HD  . ferric gluconate (FERRLECIT/NULECIT) IV  125 mg Intravenous Q Thu-HD  . heparin  5,000 Units Subcutaneous Q8H  . hydrALAZINE  50 mg Oral TID  . hydrocerin   Topical BID  . hydrocortisone cream   Topical BID  . levETIRAcetam  250 mg Oral Once per day on Tue Thu Sat  . levETIRAcetam  500 mg Oral Daily  . losartan  100 mg Oral QHS  . metoprolol tartrate  25 mg Oral BID  . multivitamin  1 tablet Oral QHS  . NIFEdipine  60 mg Oral Daily  . nitroGLYCERIN  1 inch Topical Q6H  . sodium chloride flush  3 mL Intravenous Q12H  . sucroferric oxyhydroxide  500 mg Oral TID WC  . topiramate  50 mg Oral BID  . topiramate  50 mg Oral Once per day on Tue Thu Sat  . vitamin C  500 mg Oral Daily   Continuous Infusions:  PRN Meds: acetaminophen **OR** acetaminophen, calcium carbonate, camphor-menthol, diphenhydrAMINE, hydrALAZINE, levalbuterol, promethazine, simethicone   Vital Signs    Vitals:   05/18/16 1200 05/18/16 1223 05/18/16 2000 05/19/16 0514  BP: (!) 181/109 (!) 175/89 (!) 143/97 (!) 177/89  Pulse: (!) 114 (!) 116 (!) 115 80  Resp: 15 20 20    Temp:  98.3 F (36.8 C) 98.7 F (37.1 C) 98.7 F (37.1 C)  TempSrc:  Oral Oral Oral  SpO2:  99% 98% 100%  Weight:  146 lb 13.2 oz (66.6 kg)  143 lb 14.4 oz (65.3 kg)  Height:        Intake/Output Summary (Last 24 hours) at 05/19/16 1037 Last data filed at 05/19/16 0515  Gross per 24 hour  Intake               225 ml  Output             4000 ml  Net            -3775 ml   Filed Weights   05/18/16 0823 05/18/16 1223 05/19/16 0514  Weight: 156 lb 4.9 oz (70.9 kg) 146 lb 13.2 oz (66.6 kg) 143 lb 14.4 oz (65.3 kg)    Telemetry    Sinus tach  Personally Reviewed  ECG      Physical Exam   GEN: No acute distress.   Neck: No JVD Cardiac: RRR, no murmurs, rubs,  + S4    Respiratory: Clear to auscultation bilaterally. GI: Soft, nontender, non-distended  MS: No edema; No deformity. Neuro:  Nonfocal  Psych: Normal affect   Labs    Chemistry Recent Labs Lab 05/13/16 0500  05/17/16 0454 05/18/16 0424 05/18/16 1342 05/19/16 0718  NA  --   < > 137 137 134* 137  K  --   < > 4.4 3.9 4.2 5.2*  CL  --   < > 96* 93* 99*  100*  CO2  --   < > 27 29 27 27   GLUCOSE  --   < > 71 85 115* 77  BUN  --   < > 31* 44* 12 21*  CREATININE  --   < > 9.33* 11.92* 5.20* 8.19*  CALCIUM  --   < > 9.5 9.8 8.5* 9.7  PROT 6.8  --   --   --   --   --   ALBUMIN 2.8*  < > 3.1*  --  2.7* 2.8*  AST 23  --   --   --   --   --   ALT 20  --   --   --   --   --   ALKPHOS 41  --   --   --   --   --   BILITOT 0.8  --   --   --   --   --   GFRNONAA  --   < > 7* 5* 13* 8*  GFRAA  --   < > 8* 6* 15* 9*  ANIONGAP  --   < > 14 15 8 10   < > = values in this interval not displayed.   Hematology Recent Labs Lab 05/17/16 0454 05/18/16 0424 05/19/16 0253  WBC 6.9 7.3 10.9*  RBC 3.55* 3.42* 3.62*  HGB 10.4* 10.2* 10.5*  HCT 32.9* 31.6* 33.9*  MCV 92.7 92.4 93.6  MCH 29.3 29.8 29.0  MCHC 31.6 32.3 31.0  RDW 20.1* 20.3* 19.8*  PLT 139* 189 176    Cardiac Enzymes Recent Labs Lab 05/13/16 0523 05/18/16 1342 05/18/16 1618 05/19/16 0253  TROPONINI 0.15* 0.50* 0.48* 0.40*   No results for input(s): TROPIPOC in the last 168 hours.   BNPNo results for input(s): BNP, PROBNP in the last 168 hours.   DDimer  Recent Labs Lab 05/12/16 1106  DDIMER 3.60*     Radiology    Dg Chest 2 View  Result Date:  05/18/2016 CLINICAL DATA:  Shortness of breath. EXAM: CHEST  2 VIEW COMPARISON:  05/14/2016 . FINDINGS: Cardiomegaly with mild bilateral interstitial prominence. Findings suggest mild CHF. No pleural effusion or pneumothorax. No acute bony abnormality identified. IMPRESSION: Congestive heart failure with bilateral pulmonary interstitial edema . Electronically Signed   By: Marcello Moores  Register   On: 05/18/2016 15:09    Cardiac Studies   Limited echo pending   Patient Profile     36 y.o. male with hx of ESRD  (on HD), HIV, uncontrolled HTN, seizures, DVT 2015  PResent with SOB, CP and elevatied HTN  Pt noncompliant with Rx   Assessment & Plan    1  CP  With mild trop  Elevation  May be related to HTN witn demand ischemia in setting of dialysis   Curr symptom free  Echo very differicut  Would recomm limited echo with contrast to eval wall motion   2  Pericardial effusion  Hemodyn noncompromising    BP is high   3   HTN  BP is high  Will increase metoprolol to qid  May increase further   Signed, Dorris Carnes, MD  05/19/2016, 10:37 AM

## 2016-05-19 NOTE — Progress Notes (Signed)
[facility] Progress Note   Subjective: had HD yest with 4000 cc off.  CXR done after HD though showed vasc congestion, CXR's reviewed in detail. BP's remain high.   Vitals:   05/18/16 1200 05/18/16 1223 05/18/16 2000 05/19/16 0514  BP: (!) 181/109 (!) 175/89 (!) 143/97 (!) 177/89  Pulse: (!) 114 (!) 116 (!) 115 80  Resp: 15 20 20    Temp:  98.3 F (36.8 C) 98.7 F (37.1 C) 98.7 F (37.1 C)  TempSrc:  Oral Oral Oral  SpO2:  99% 98% 100%  Weight:  66.6 kg (146 lb 13.2 oz)  65.3 kg (143 lb 14.4 oz)  Height:        Inpatient medications: . abacavir  600 mg Oral Q supper  . azithromycin  1,200 mg Oral Weekly  . dapsone  100 mg Oral Daily  . [START ON 05/20/2016] darbepoetin (ARANESP) injection - DIALYSIS  200 mcg Intravenous Q Sat-HD  . darunavir-cobicistat  1 tablet Oral QPM  . dolutegravir  50 mg Oral QPM  . doxercalciferol  6 mcg Intravenous Q T,Th,Sa-HD  . ferric gluconate (FERRLECIT/NULECIT) IV  125 mg Intravenous Q Thu-HD  . heparin  5,000 Units Subcutaneous Q8H  . hydrALAZINE  50 mg Oral TID  . hydrocerin   Topical BID  . hydrocortisone cream   Topical BID  . levETIRAcetam  250 mg Oral Once per day on Tue Thu Sat  . levETIRAcetam  500 mg Oral Daily  . losartan  100 mg Oral QHS  . metoprolol tartrate  25 mg Oral QID  . multivitamin  1 tablet Oral QHS  . NIFEdipine  60 mg Oral Daily  . nitroGLYCERIN  1 inch Topical Q6H  . sodium chloride flush  3 mL Intravenous Q12H  . sucroferric oxyhydroxide  500 mg Oral TID WC  . topiramate  50 mg Oral BID  . topiramate  50 mg Oral Once per day on Tue Thu Sat  . vitamin C  500 mg Oral Daily    acetaminophen **OR** acetaminophen, calcium carbonate, camphor-menthol, diphenhydrAMINE, hydrALAZINE, levalbuterol, promethazine, simethicone  Exam: Alert, no distress NO jvd Chest dec'd L base, R clear RRR  Abd soft ntnd Ext no edema  LUE AVF+bruit  Dialysis: [id number]  3h 6mins   450/800  2/2   EDW 65.5kg  L AVF   Heparin none  -Hectorol 6 mcg IV q HD -Mircera 200 mcg IV 2 weeks (last dose 3/10)  -Venofer 50 mg IV q week (to start 3/22)      Assessment: 1. Headaches - sp LP today, results negative. Has chronic vol overload and HTN that may be contributing.  2. ESRD - cont HD TTS 3. CP/ SOB - resolved, due to #4 4. Vol overload - better and down to dry wt but still BP's are high and CXR evidence of vol excess yesterday. Have talked with pt about extra HD today and try to get vol down further, may have lost body wt.   5. Nonadherence - an issue w HD 6. HIV/ AIDS- counts are low 7. HTN - losart/ hydral at home and here. Metoprolol and procardia added here. BP's still up.  8. Hist of DVT 9. Seizure d/o - pm Le[[ra 10. 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   Plan - extra HD today, try to lower dry wt   Kelly Splinter MD [place] pager [phone]   05/19/2016, 11:43 AM  Recent Labs Lab 05/17/16 0454 05/18/16 0424 05/18/16 1342 05/19/16 0718  NA 137 137 134* 137  K 4.4 3.9 4.2 5.2*  CL 96* 93* 99* 100*  CO2 27 29 27 27   GLUCOSE 71 85 115* 77  BUN 31* 44* 12 21*  CREATININE 9.33* 11.92* 5.20* 8.19*  CALCIUM 9.5 9.8 8.5* 9.7  PHOS 5.6*  --  2.7 4.8*    Recent Labs Lab 05/13/16 0500  05/17/16 0454 05/18/16 1342 05/19/16 0718  AST 23  --   --   --   --   ALT 20  --   --   --   --   ALKPHOS 41  --   --   --   --   BILITOT 0.8  --   --   --   --   PROT 6.8  --   --   --   --   ALBUMIN 2.8*  < > 3.1* 2.7* 2.8*  < > = values in this interval not displayed.  Recent Labs Lab 05/17/16 0454 05/18/16 0424 05/19/16 0253  WBC 6.9 7.3 10.9*  HGB 10.4* 10.2* 10.5*  HCT 32.9* 31.6* 33.9*  MCV 92.7 92.4 93.6  PLT 139* 189 176   Iron/TIBC/Ferritin/ %Sat    Component Value Date/Time   IRON 59 01/12/2015 1617   TIBC 248 (L) 01/12/2015 1617   FERRITIN 1,050 (H) 02/01/2016 1836   IRONPCTSAT 24 01/12/2015 1617

## 2016-05-19 NOTE — Progress Notes (Signed)
Pt took off his nitro patch and rubbed off the medicine, pt was saying that it was causing him to be SOB, RN explained the importance of the medicine, but pt stated he did not want this med again, MD was notified of this and that he refuses his nitro, will continue to monitor, Thanks, Beola Cord RN.

## 2016-05-19 NOTE — Progress Notes (Signed)
Family Medicine Teaching Service Daily Progress Note Intern Pager: [phone]  Patient name: Jim Wyatt Medical record number: [id number] Date of birth: April 05, 1980 Age: 36 y.o. Gender: male  Primary Care Provider: [name], MD Consultants: Nephro  Code Status: FULL   Pt Overview and Major Events to Date:  3/16: Admitted to [facility]  3/17: HD   Assessment and Plan: Jim Wyatt is a 36 y.o. male presenting with one day of worsening shortness of breath with changes in vision. PMH is significant for poorly controlled hypertension, HIV, end-stage renal disease on dialysis (T/TH/S) and history of DVT x 1 in 2015 (not on any anti-coagulation).  Headache, ongoing with resolved changes in vision:  HA rated 4/10 this morning without vision changes. BP to 177/84 this AM.  LP showing normal CSF cell count and negative cryptococcal ag. Received dialysis yesterday and was able to complete course. Per nephrology, will have another course.  - f/u CSF culture and toxoplasmosis ag - ID consulted; appreciate assistance.  - tylenol PRN headache - neuro checks q4hrs - cont topamax  Shortness of breath with chest pain, ongoing:   EKG yesterday showing sinus tachycardia without ischemic changes.  Troponin elevated to 0.5 and trended down to 0.4 this morning. Recommended echo to evaluate pericardial effusion, LVEF and wall motion. They felt that this could represent subendocardial ischemia in setting of LVH, hypertension and ESRD.  Also added nitro paste for symptom management, but patient pulled off patch and wiped off paste.  CXR showing cardiomegaly with mild bilateral interstitial prominence.  - repeat echo - oxpenex PRN, given tachycardia - wean O2 as able - telemetry and continuous pulse ox  - HIV RNA quant - continue home azithromycin (once per week on Monday) and dapsone for HIV opportunistic infection prophylaxis  Renovascular hypertension with hypertensive urgency, improving:  BP  this morning 177/84 with ranges from 142-177/83-97. Restarted hydralazine due to elevated BP after dialysis.  Nifedipine increased back to 60 and first dose at 1000.  Cardiology recommended increasing metoprolol 25mg  4x daily.  - cont hydralazine - cont losartan 100 mg daily - cont nifedipine 60mg  daily and metoprolol 25 mg QID - hydralazine 5mg  IV PRN for SBP >180 or DBP >110 - continuous cardiac monitoring  End-stage renal disease: Received dialysis yesterday and completed whole session.  Will have another session today.  Phos to 4.8 and potassium to 5.2.  - nephrology consulted;appreciate assistance - daily renal function panel - palliative c/s- patient wishes to continue receiving dialysis, but will stop if becomes too painful  HIV disease/advanced AIDS: RNA quant and integrase pending. NGTD CSF cultures. Scheduled follow up with Dr. Graylon Good 05/30/16  - f/u CSF culture - continue abicavir 300mg  BID, dapsone 100mg  and tivicar 50mg  daily - f/u genosure integrase and RNA ultraquant reflex to genotype and RNA quant  Abd pain, stable:  Continues with appropriate appetite.  -continue to monitor  Seizure disorder, stable: Last seizure in January. Has only had 2 seizures in his lifetime. Taking keppra every day.  - continue keppra 500mg  daily and an additional 250mg  on dialysis days (T/TH/S) - discontinued tramadol  Patient concern for OSA: Supposed to have sleep study at the end of this month. History of obesity, but has lost >50 pounds d/t renal disease and HIV/advanced AIDS. Does not currently use CPAP.  - sleep study as an outpatient  Asthma: Takes albuterol. Has reportedly been using it around four times daily recently for shortness of breath. Denies tobacco.  - duonebs PRN -  consider controller med - outpatient PFTs   FEN/GI: renal diet with fluid restriction Prophylaxis: subcutaneous heparin  Disposition: pending improvement with headache and infectious r/o  Subjective:   No complaints this morning. Chest pain has resolved and denies any abd pain, but does continue to endorse SOB.   Objective: Temp:  [98.3 F (36.8 C)-98.7 F (37.1 C)] 98.7 F (37.1 C) (03/23 0514) Pulse Rate:  [80-118] 80 (03/23 0514) Resp:  [15-29] 20 (03/22 2000) BP: (141-181)/(78-111) 177/89 (03/23 0514) SpO2:  [98 %-100 %] 100 % (03/23 0514) Weight:  [143 lb 14.4 oz (65.3 kg)-156 lb 4.9 oz (70.9 kg)] 143 lb 14.4 oz (65.3 kg) (03/23 0514) Physical Exam: General: lying in bed, NAD with [place] in place Cardiovascular: tachycardic, RR, 2+ palpable pulses Respiratory: NWOB, bibasilar crackles without any wheezing or rhonchi Abdomen: +BS, Soft, NTND  Extremities: No LE edema Neuro: CN II-XII intact. Strength 5/5 in all extremities. Normal speech and though content.   Laboratory:  Recent Labs Lab 05/17/16 0454 05/18/16 0424 05/19/16 0253  WBC 6.9 7.3 10.9*  HGB 10.4* 10.2* 10.5*  HCT 32.9* 31.6* 33.9*  PLT 139* 189 176    Recent Labs Lab 05/13/16 0500  05/17/16 0454 05/18/16 0424 05/18/16 1342  NA  --   < > 137 137 134*  K  --   < > 4.4 3.9 4.2  CL  --   < > 96* 93* 99*  CO2  --   < > 27 29 27   BUN  --   < > 31* 44* 12  CREATININE  --   < > 9.33* 11.92* 5.20*  CALCIUM  --   < > 9.5 9.8 8.5*  PROT 6.8  --   --   --   --   BILITOT 0.8  --   --   --   --   ALKPHOS 41  --   --   --   --   ALT 20  --   --   --   --   AST 23  --   --   --   --   GLUCOSE  --   < > 71 85 115*  < > = values in this interval not displayed.   Imaging/Diagnostic Tests: Dg Chest 2 View  Result Date: 05/12/2016 CLINICAL DATA:  Shortness of breath and chest pain EXAM: CHEST  2 VIEW COMPARISON:  04/27/2016 FINDINGS: Cardiac shadow remains enlarged. Mild increased vascular congestion is noted without significant edema or effusion. No focal infiltrate is identified. No bony abnormality is seen. IMPRESSION: Changes consistent with mild central vascular congestion. Electronically Signed   By: Inez Catalina M.D.   On: 05/12/2016 08:11   Ct Head Wo Contrast  Result Date: 05/09/2016 CLINICAL DATA:  Headaches EXAM: CT HEAD WITHOUT CONTRAST TECHNIQUE: Contiguous axial images were obtained from the base of the skull through the vertex without intravenous contrast. COMPARISON:  01/17/2016 FINDINGS: Brain: No evidence of acute infarction, hemorrhage, hydrocephalus, extra-axial collection or mass lesion/mass effect. Vascular: No hyperdense vessel or unexpected calcification. Skull: Normal. Negative for fracture or focal lesion. Sinuses/Orbits: Mucosal thickening is noted in the maxillary antra bilaterally stable from the previous exam. Other: None IMPRESSION: No acute intracranial abnormality noted. Mucosal thickening in the maxillary antra bilaterally. Electronically Signed   By: Inez Catalina M.D.   On: 05/09/2016 14:25   Ct Angio Chest Pe W Or Wo Contrast  Result Date: 05/12/2016 CLINICAL DATA:  Chest pain and shortness of breath  since yesterday. EXAM: CT ANGIOGRAPHY CHEST WITH CONTRAST TECHNIQUE: Multidetector CT imaging of the chest was performed using the standard protocol during bolus administration of intravenous contrast. Multiplanar CT image reconstructions and MIPs were obtained to evaluate the vascular anatomy. CONTRAST:  180 cc of Isovue 370 COMPARISON:  Chest radiographs dated 05/12/2016. FINDINGS: Cardiovascular: Initial images obtained with 100 cc of Isovue 370 did not demonstrate adequate opacification of the pulmonary arteries. Therefore, repeat images were obtained with an additional 80 cc of Isovue 370. These demonstrate normally opacified pulmonary arteries with no pulmonary arterial filling defects. The heart is enlarged. The pericardial effusion is demonstrated, measuring 1.9 cm in maximum thickness. Mediastinum/Nodes: No enlarged mediastinal, hilar, or axillary lymph nodes. Thyroid gland, trachea, and esophagus demonstrate no significant findings. Lungs/Pleura: Prominent pulmonary  vasculature. Small left pleural effusion. Upper Abdomen: No acute abnormality. Musculoskeletal: Minimal thoracic spine degenerative changes. Review of the MIP images confirms the above findings. IMPRESSION: 1. No pulmonary emboli. 2. Small to moderate-sized pericardial effusion. 3. Small left pleural effusion. 4. Cardiomegaly and pulmonary vascular congestion. Electronically Signed   By: Claudie Revering M.D.   On: 05/12/2016 16:03   Eloise Levels, MD 05/19/2016, 7:19 AM PGY-1, [place] Intern pager: [phone], text pages welcome

## 2016-05-20 DIAGNOSIS — I161 Hypertensive emergency: Secondary | ICD-10-CM

## 2016-05-20 LAB — CBC
HCT: 32.3 % — ABNORMAL LOW (ref 39.0–52.0)
Hemoglobin: 9.9 g/dL — ABNORMAL LOW (ref 13.0–17.0)
MCH: 28.7 pg (ref 26.0–34.0)
MCHC: 30.7 g/dL (ref 30.0–36.0)
MCV: 93.6 fL (ref 78.0–100.0)
Platelets: 183 10*3/uL (ref 150–400)
RBC: 3.45 MIL/uL — ABNORMAL LOW (ref 4.22–5.81)
RDW: 19.1 % — ABNORMAL HIGH (ref 11.5–15.5)
WBC: 8.1 10*3/uL (ref 4.0–10.5)

## 2016-05-20 LAB — RENAL FUNCTION PANEL
Albumin: 2.7 g/dL — ABNORMAL LOW (ref 3.5–5.0)
Anion gap: 13 (ref 5–15)
BUN: 25 mg/dL — ABNORMAL HIGH (ref 6–20)
CO2: 27 mmol/L (ref 22–32)
Calcium: 9.8 mg/dL (ref 8.9–10.3)
Chloride: 98 mmol/L — ABNORMAL LOW (ref 101–111)
Creatinine, Ser: 6.98 mg/dL — ABNORMAL HIGH (ref 0.61–1.24)
GFR calc Af Amer: 11 mL/min — ABNORMAL LOW (ref >60)
GFR calc non Af Amer: 9 mL/min — ABNORMAL LOW (ref >60)
Glucose, Bld: 79 mg/dL (ref 65–99)
Phosphorus: 5.6 mg/dL — ABNORMAL HIGH (ref 2.5–4.6)
Potassium: 5.2 mmol/L — ABNORMAL HIGH (ref 3.5–5.1)
Sodium: 138 mmol/L (ref 135–145)

## 2016-05-20 LAB — CSF CULTURE: Culture: NO GROWTH

## 2016-05-20 LAB — CSF CULTURE W GRAM STAIN: Gram Stain: NONE SEEN

## 2016-05-20 MED ORDER — LABETALOL HCL 200 MG PO TABS
300.0000 mg | ORAL_TABLET | Freq: Two times a day (BID) | ORAL | Status: DC
  Filled 2016-05-20: qty 1

## 2016-05-20 MED ORDER — DOXERCALCIFEROL 4 MCG/2ML IV SOLN
INTRAVENOUS | Status: AC
  Administered 2016-05-20: 6 ug via INTRAVENOUS
  Filled 2016-05-20: qty 4

## 2016-05-20 MED ORDER — DIPHENHYDRAMINE HCL 25 MG PO CAPS
ORAL_CAPSULE | ORAL | Status: AC
  Filled 2016-05-20: qty 1

## 2016-05-20 MED ORDER — HYDRALAZINE HCL 50 MG PO TABS
75.0000 mg | ORAL_TABLET | Freq: Three times a day (TID) | ORAL | Status: DC
  Administered 2016-05-20 – 2016-05-21 (×2): 75 mg via ORAL
  Filled 2016-05-20 (×3): qty 1

## 2016-05-20 MED ORDER — METOPROLOL SUCCINATE ER 100 MG PO TB24
100.0000 mg | ORAL_TABLET | Freq: Every day | ORAL | Status: DC
  Administered 2016-05-20: 100 mg via ORAL
  Filled 2016-05-20: qty 1

## 2016-05-20 MED ORDER — HYDRALAZINE HCL 20 MG/ML IJ SOLN
INTRAMUSCULAR | Status: AC
  Administered 2016-05-20: 20 mg
  Filled 2016-05-20: qty 1

## 2016-05-20 MED ORDER — DARBEPOETIN ALFA 200 MCG/0.4ML IJ SOSY
PREFILLED_SYRINGE | INTRAMUSCULAR | Status: AC
  Administered 2016-05-20: 200 ug via INTRAVENOUS
  Filled 2016-05-20: qty 0.4

## 2016-05-20 NOTE — Progress Notes (Signed)
Progress Note  Patient Name: Jim Wyatt Date of Encounter: 05/20/2016  Primary Cardiologist: Dr. Harrington Challenger (new)  Subjective   No pain.  Breathing he says is back to baseline.   Inpatient Medications    Scheduled Meds: . abacavir  600 mg Oral Q supper  . azithromycin  1,200 mg Oral Weekly  . dapsone  100 mg Oral Daily  . darbepoetin (ARANESP) injection - DIALYSIS  200 mcg Intravenous Q Sat-HD  . darunavir-cobicistat  1 tablet Oral QPM  . dolutegravir  50 mg Oral QPM  . doxercalciferol  6 mcg Intravenous Q T,Th,Sa-HD  . ferric gluconate (FERRLECIT/NULECIT) IV  125 mg Intravenous Q Thu-HD  . heparin  5,000 Units Subcutaneous Q8H  . hydrALAZINE  50 mg Oral TID  . hydrocerin   Topical BID  . hydrocortisone cream   Topical BID  . levETIRAcetam  250 mg Oral Once per day on Tue Thu Sat  . levETIRAcetam  500 mg Oral Daily  . losartan  100 mg Oral QHS  . metoprolol tartrate  25 mg Oral QID  . multivitamin  1 tablet Oral QHS  . NIFEdipine  60 mg Oral Daily  . nitroGLYCERIN  1 inch Topical Q6H  . sodium chloride flush  3 mL Intravenous Q12H  . sucroferric oxyhydroxide  500 mg Oral TID WC  . topiramate  50 mg Oral BID  . topiramate  50 mg Oral Once per day on Tue Thu Sat  . vitamin C  500 mg Oral Daily   Continuous Infusions:  PRN Meds: acetaminophen **OR** acetaminophen, calcium carbonate, camphor-menthol, diphenhydrAMINE, hydrALAZINE, levalbuterol, promethazine, simethicone   Vital Signs    Vitals:   05/20/16 0504 05/20/16 0930 05/20/16 0932 05/20/16 0933  BP: (!) 162/101 (!) 169/98 (!) 171/106 (!) 185/106  Pulse: (!) 121 (!) 118 (!) 124 (!) 127  Resp: 18     Temp: 99.1 F (37.3 C)     TempSrc: Oral     SpO2: 94%     Weight: 143 lb 8.3 oz (65.1 kg)   146 lb 3.2 oz (66.3 kg)  Height:        Intake/Output Summary (Last 24 hours) at 05/20/16 0946 Last data filed at 05/20/16 1093  Gross per 24 hour  Intake              930 ml  Output             2500 ml  Net             -1570 ml   Filed Weights   05/19/16 1858 05/20/16 0504 05/20/16 0933  Weight: 139 lb 5.3 oz (63.2 kg) 143 lb 8.3 oz (65.1 kg) 146 lb 3.2 oz (66.3 kg)    Telemetry    NSR - Personally Reviewed  ECG    NA - Personally Reviewed  Physical Exam   GEN: No acute distress.   Neck: No  JVD Cardiac: RRR, no murmurs, rubs, or gallops.  Respiratory: Clear  to auscultation bilaterally. GI: Soft, nontender, non-distended  MS: No  edema; No deformity. Neuro:  Nonfocal  Psych: Normal affect   Labs    Chemistry Recent Labs Lab 05/18/16 1342 05/19/16 0718 05/20/16 0506  NA 134* 137 138  K 4.2 5.2* 5.2*  CL 99* 100* 98*  CO2 27 27 27   GLUCOSE 115* 77 79  BUN 12 21* 25*  CREATININE 5.20* 8.19* 6.98*  CALCIUM 8.5* 9.7 9.8  ALBUMIN 2.7* 2.8* 2.7*  GFRNONAA 13*  8* 9*  GFRAA 15* 9* 11*  ANIONGAP 8 10 13      Hematology Recent Labs Lab 05/18/16 0424 05/19/16 0253 05/20/16 0506  WBC 7.3 10.9* 8.1  RBC 3.42* 3.62* 3.45*  HGB 10.2* 10.5* 9.9*  HCT 31.6* 33.9* 32.3*  MCV 92.4 93.6 93.6  MCH 29.8 29.0 28.7  MCHC 32.3 31.0 30.7  RDW 20.3* 19.8* 19.1*  PLT 189 176 183    Cardiac Enzymes Recent Labs Lab 05/18/16 1342 05/18/16 1618 05/19/16 0253  TROPONINI 0.50* 0.48* 0.40*   No results for input(s): TROPIPOC in the last 168 hours.   BNPNo results for input(s): BNP, PROBNP in the last 168 hours.   DDimer No results for input(s): DDIMER in the last 168 hours.   Radiology    Dg Chest 2 View  Result Date: 05/18/2016 CLINICAL DATA:  Shortness of breath. EXAM: CHEST  2 VIEW COMPARISON:  05/14/2016 . FINDINGS: Cardiomegaly with mild bilateral interstitial prominence. Findings suggest mild CHF. No pleural effusion or pneumothorax. No acute bony abnormality identified. IMPRESSION: Congestive heart failure with bilateral pulmonary interstitial edema . Electronically Signed   By: Marcello Moores  Register   On: 05/18/2016 15:09    Cardiac Studies   ECHO (05/19/16) Study  Conclusions  - Left ventricle: The cavity size was normal. There was severe   concentric hypertrophy. Systolic function was normal. The   estimated ejection fraction was in the range of 55% to 60%. Wall   motion was normal; there were no regional wall motion   abnormalities. Doppler parameters are consistent with abnormal   left ventricular relaxation (grade 1 diastolic dysfunction).   There was no evidence of elevated ventricular filling pressure by   Doppler parameters. - Aortic valve: There was trivial regurgitation. - Mitral valve: There was mild regurgitation. - Left atrium: The atrium was mildly dilated. - Right ventricle: Systolic function was normal. - Right atrium: The atrium was mildly dilated. - Tricuspid valve: There was mild regurgitation. - Inferior vena cava: The vessel was normal in size. - Pericardium, extracardiac: There was no pericardial effusion.  Patient Profile     36 y.o. male with a history of ESRD on HD, DVT in 2015, uncontrolled HTN, HIV, GID and seizure  who came to  [facility] 05/12/16 for progressive worsening of shortness of breath, chest pain and elevated blood pressure.  Assessment & Plan    ELEVATED TROPONIN/CHEST PAIN:  Related to volume, HTN.  No plan for ischemia work up.   Troponin trend is flat and in this situation is not diagnostic of ACS.    ACUTE ON CHRONIC DIASTOLIC HF:  1962 CC removed with dialysis yesterday.  Volume management per renal.    ABNORMAL ECHO:  There was a question of a pericardial effusion. However, there was no evidence of this on the echo above.  No further evaluation.    HTN:  BPs still elevated.  I will change to Toprol XL and increase hydralazine.    Signed, Minus Breeding, MD  05/20/2016, 9:46 AM

## 2016-05-20 NOTE — Progress Notes (Signed)
[facility] Progress Note   Subjective: HD yest again with 2.5L off, 64kg standing wt we did just now.  Did orthostatics, no drop in BP on standing. BP's remain high.   Vitals:   05/20/16 0504 05/20/16 0930 05/20/16 0932 05/20/16 0933  BP: (!) 162/101 (!) 169/98 (!) 171/106 (!) 185/106  Pulse: (!) 121 (!) 118 (!) 124 (!) 127  Resp: 18     Temp: 99.1 F (37.3 C)     TempSrc: Oral     SpO2: 94%     Weight: 65.1 kg (143 lb 8.3 oz)   66.3 kg (146 lb 3.2 oz)  Height:        Inpatient medications: . abacavir  600 mg Oral Q supper  . azithromycin  1,200 mg Oral Weekly  . dapsone  100 mg Oral Daily  . darbepoetin (ARANESP) injection - DIALYSIS  200 mcg Intravenous Q Sat-HD  . darunavir-cobicistat  1 tablet Oral QPM  . dolutegravir  50 mg Oral QPM  . doxercalciferol  6 mcg Intravenous Q T,Th,Sa-HD  . ferric gluconate (FERRLECIT/NULECIT) IV  125 mg Intravenous Q Thu-HD  . heparin  5,000 Units Subcutaneous Q8H  . hydrALAZINE  75 mg Oral TID  . hydrocerin   Topical BID  . hydrocortisone cream   Topical BID  . levETIRAcetam  250 mg Oral Once per day on Tue Thu Sat  . levETIRAcetam  500 mg Oral Daily  . losartan  100 mg Oral QHS  . metoprolol succinate  100 mg Oral Daily  . multivitamin  1 tablet Oral QHS  . NIFEdipine  60 mg Oral Daily  . nitroGLYCERIN  1 inch Topical Q6H  . sodium chloride flush  3 mL Intravenous Q12H  . sucroferric oxyhydroxide  500 mg Oral TID WC  . topiramate  50 mg Oral BID  . topiramate  50 mg Oral Once per day on Tue Thu Sat  . vitamin C  500 mg Oral Daily    acetaminophen **OR** acetaminophen, calcium carbonate, camphor-menthol, diphenhydrAMINE, hydrALAZINE, levalbuterol, promethazine, simethicone  Exam: Alert, no distress NO jvd Chest dec'd L base, R clear RRR  Abd soft ntnd Ext no edema  LUE AVF+bruit  Dialysis: [id number]  3h 51mins   450/800  2/2   EDW 65.5kg  L AVF  Heparin none  -Hectorol 6 mcg IV q HD -Mircera 200 mcg IV 2  weeks (last dose 3/10)  -Venofer 50 mg IV q week (to start 3/22)      Assessment: 1. Headaches - sp LP today, results negative. Has chronic vol overload and HTN that may be contributing. Pt asked about doing HD 4 times per week for 3 hrs instead of 4hrs three days per week.  Spoke with Dr Lorrene Reid, will look into this possibility but not optimistic that [facility] will approve.   2. ESRD - cont HD TTS 3. CP/ SOB - resolved, due to #4 4. Vol overload / abnl CXR - lowering dry wt w HD again today 5. Nonadherence - an issue w HD, pt says it's because of late HD headaches 6. HIV/ AIDS- counts are low 7. HTN - losart/ hydral at home and here.  BB and Procardia added here. BP's still up.  Will change metoprolol to labetalol, see if this helps.  8. Hist of DVT 9. Seizure d/o - pm [place][[ra 10. 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   Plan - HD again today, lowering dry wt  Kelly Splinter MD [facility] pager [phone]   05/20/2016, 11:58 AM    Recent Labs Lab 05/18/16 1342 05/19/16 0718 05/20/16 0506  NA 134* 137 138  K 4.2 5.2* 5.2*  CL 99* 100* 98*  CO2 27 27 27   GLUCOSE 115* 77 79  BUN 12 21* 25*  CREATININE 5.20* 8.19* 6.98*  CALCIUM 8.5* 9.7 9.8  PHOS 2.7 4.8* 5.6*    Recent Labs Lab 05/18/16 1342 05/19/16 0718 05/20/16 0506  ALBUMIN 2.7* 2.8* 2.7*    Recent Labs Lab 05/18/16 0424 05/19/16 0253 05/20/16 0506  WBC 7.3 10.9* 8.1  HGB 10.2* 10.5* 9.9*  HCT 31.6* 33.9* 32.3*  MCV 92.4 93.6 93.6  PLT 189 176 183   Iron/TIBC/Ferritin/ %Sat    Component Value Date/Time   IRON 59 01/12/2015 1617   TIBC 248 (L) 01/12/2015 1617   FERRITIN 1,050 (H) 02/01/2016 1836   IRONPCTSAT 24 01/12/2015 1617

## 2016-05-20 NOTE — Progress Notes (Signed)
Family Medicine Teaching Service Daily Progress Note Intern Pager: [phone]  Patient name: Jim Wyatt Medical record number: [id number] Date of birth: 1980-12-13 Age: 36 y.o. Gender: male  Primary Care Provider: [name], MD Consultants: Nephro  Code Status: FULL   Pt Overview and Major Events to Date:  3/16: Admitted to [facility]  3/17: HD   Assessment and Plan: Jim Wyatt is a 36 y.o. male presenting with one day of worsening shortness of breath with changes in vision. PMH is significant for poorly controlled hypertension, HIV, end-stage renal disease on dialysis (T/TH/S) and history of DVT x 1 in 2015 (not on any anti-coagulation).  Headache with changes in vision, resolved:   LP showing normal CSF cell count and negative cryptococcal ag.   - f/u CSF culture (no growth x 2 day) and toxoplasmosis ag pending - ID consulted; appreciate assistance (signed off): unlikely infectious cause. Await viral load, HIV genotype and resistance assays. Follow up in clinic in early April  - tylenol PRN headache - neuro checks q4hrs - cont topamax  Shortness of breath with chest pain, resolved: On room air now.  Likely due to volume overload in the setting of shortened HD and this is resolved with HD. Patient declined nitropaste per cards recommendations.  - repeat echo with normal EF and G1DD and slightly improved pericardial effusion has slightly improved now mild circumferential without evidence of tamponade - cardiology consulted, appreciate assistance - oxpenex PRN, given tachycardia - telemetry and continuous pulse ox  - HIV RNA quant - continue home azithromycin (once per week on Monday) and dapsone for HIV opportunistic infection prophylaxis  Renovascular hypertension with hypertensive urgency, improving: Extra HD 3/23. HD today  - cont hydralazine 50mg  TID - cont losartan 100 mg daily - cont nifedipine 60mg  daily  - metoprolol XR 100mg  started today  - hydralazine 5mg   IV PRN for SBP >180 or DBP >110 - continuous cardiac monitoring  End-stage renal disease: Extra HD session 3/23. Will likely have HD today as TTHS.  - nephrology consulted;appreciate assistance - daily renal function panel - palliative c/s- patient wishes to continue receiving dialysis, but will stop if becomes too painful  Anemia of Chronic Disease:  - Aranesp - Ferric Gluconate  HIV disease/advanced AIDS: RNA quant and integrase pending. NGTD CSF cultures. Scheduled follow up with Dr. Graylon Good 05/30/16  - f/u CSF culture - continue abicavir 300mg  BID, dapsone 100mg  and tivicay 50mg  daily, Prezbobix 800-150mg  nightly  - f/u genosure integrase and RNA ultraquant reflex to genotype and RNA quant  Abd pain, stable:  Continues with appropriate appetite.  -continue to monitor  Seizure disorder, stable: Last seizure in January. Has only had 2 seizures in his lifetime. Taking keppra every day.  - continue keppra 500mg  daily and an additional 250mg  on dialysis days (T/TH/S) - discontinued tramadol  Patient concern for OSA: Supposed to have sleep study at the end of this month. History of obesity, but has lost >50 pounds d/t renal disease and HIV/advanced AIDS. Does not currently use CPAP.  - sleep study as an outpatient  Asthma: Takes albuterol. Has reportedly been using it around four times daily recently for shortness of breath. Denies tobacco.  - duonebs PRN - consider controller med - outpatient PFTs   FEN/GI: renal diet with fluid restriction Prophylaxis: subcutaneous heparin  Disposition: pending improvement with headache and infectious r/o  Subjective:  Reports of no concerns today and no pain today. Asked when he can go home.   Objective:  Temp:  [98 F (36.7 C)-99.1 F (37.3 C)] 99.1 F (37.3 C) (03/24 0504) Pulse Rate:  [75-121] 121 (03/24 0504) Resp:  [18-21] 18 (03/24 0504) BP: (161-203)/(100-169) 162/101 (03/24 0504) SpO2:  [94 %-100 %] 94 % (03/24  0504) Weight:  [63.2 kg (139 lb 5.3 oz)-65.7 kg (144 lb 13.5 oz)] 65.1 kg (143 lb 8.3 oz) (03/24 0504) Physical Exam: General: lying in bed, NAD, [place] on his forehead  Cardiovascular: tachycardic, RR, 2+ palpable pulses Respiratory: NWOB, CTAB Abdomen: +BS, Soft, NTND  Extremities: No LE edema Neuro:no focal neurological deficits.  Normal speech and though content.   Laboratory:  Recent Labs Lab 05/18/16 0424 05/19/16 0253 05/20/16 0506  WBC 7.3 10.9* 8.1  HGB 10.2* 10.5* 9.9*  HCT 31.6* 33.9* 32.3*  PLT 189 176 183    Recent Labs Lab 05/18/16 1342 05/19/16 0718 05/20/16 0506  NA 134* 137 138  K 4.2 5.2* 5.2*  CL 99* 100* 98*  CO2 27 27 27   BUN 12 21* 25*  CREATININE 5.20* 8.19* 6.98*  CALCIUM 8.5* 9.7 9.8  GLUCOSE 115* 77 79     Imaging/Diagnostic Tests: Dg Chest 2 View  Result Date: 05/12/2016 CLINICAL DATA:  Shortness of breath and chest pain EXAM: CHEST  2 VIEW COMPARISON:  04/27/2016 FINDINGS: Cardiac shadow remains enlarged. Mild increased vascular congestion is noted without significant edema or effusion. No focal infiltrate is identified. No bony abnormality is seen. IMPRESSION: Changes consistent with mild central vascular congestion. Electronically Signed   By: Inez Catalina M.D.   On: 05/12/2016 08:11   Ct Head Wo Contrast  Result Date: 05/09/2016 CLINICAL DATA:  Headaches EXAM: CT HEAD WITHOUT CONTRAST TECHNIQUE: Contiguous axial images were obtained from the base of the skull through the vertex without intravenous contrast. COMPARISON:  01/17/2016 FINDINGS: Brain: No evidence of acute infarction, hemorrhage, hydrocephalus, extra-axial collection or mass lesion/mass effect. Vascular: No hyperdense vessel or unexpected calcification. Skull: Normal. Negative for fracture or focal lesion. Sinuses/Orbits: Mucosal thickening is noted in the maxillary antra bilaterally stable from the previous exam. Other: None IMPRESSION: No acute intracranial abnormality noted.  Mucosal thickening in the maxillary antra bilaterally. Electronically Signed   By: Inez Catalina M.D.   On: 05/09/2016 14:25   Ct Angio Chest Pe W Or Wo Contrast  Result Date: 05/12/2016 CLINICAL DATA:  Chest pain and shortness of breath since yesterday. EXAM: CT ANGIOGRAPHY CHEST WITH CONTRAST TECHNIQUE: Multidetector CT imaging of the chest was performed using the standard protocol during bolus administration of intravenous contrast. Multiplanar CT image reconstructions and MIPs were obtained to evaluate the vascular anatomy. CONTRAST:  180 cc of Isovue 370 COMPARISON:  Chest radiographs dated 05/12/2016. FINDINGS: Cardiovascular: Initial images obtained with 100 cc of Isovue 370 did not demonstrate adequate opacification of the pulmonary arteries. Therefore, repeat images were obtained with an additional 80 cc of Isovue 370. These demonstrate normally opacified pulmonary arteries with no pulmonary arterial filling defects. The heart is enlarged. The pericardial effusion is demonstrated, measuring 1.9 cm in maximum thickness. Mediastinum/Nodes: No enlarged mediastinal, hilar, or axillary lymph nodes. Thyroid gland, trachea, and esophagus demonstrate no significant findings. Lungs/Pleura: Prominent pulmonary vasculature. Small left pleural effusion. Upper Abdomen: No acute abnormality. Musculoskeletal: Minimal thoracic spine degenerative changes. Review of the MIP images confirms the above findings. IMPRESSION: 1. No pulmonary emboli. 2. Small to moderate-sized pericardial effusion. 3. Small left pleural effusion. 4. Cardiomegaly and pulmonary vascular congestion. Electronically Signed   By: Claudie Revering M.D.   On: 05/12/2016  16:03   ECHO 05/19/16: EF 55-60%,normal wall motion, G1DD, pericardial effusion slightly improved now mild circumferential and no evidence of tamponade.   Smiley Houseman, MD 05/20/2016, 6:36 AM PGY-2, [place] Intern pager: [phone], text pages welcome

## 2016-05-20 NOTE — Progress Notes (Addendum)
Patient refused majority of his evening medication. Stated that his blood pressure is ok and he want to sleep and be left alone. Educated patient about importance of medication however he still continues to refuse. No complaints of pain, VS stable, will continue to monitor.  Mali Eppard, RN

## 2016-05-21 ENCOUNTER — Inpatient Hospital Stay (HOSPITAL_COMMUNITY): Payer: Medicare Other

## 2016-05-21 LAB — CBC
HCT: 32.2 % — ABNORMAL LOW (ref 39.0–52.0)
Hemoglobin: 10.1 g/dL — ABNORMAL LOW (ref 13.0–17.0)
MCH: 29 pg (ref 26.0–34.0)
MCHC: 31.4 g/dL (ref 30.0–36.0)
MCV: 92.5 fL (ref 78.0–100.0)
Platelets: 194 10*3/uL (ref 150–400)
RBC: 3.48 MIL/uL — ABNORMAL LOW (ref 4.22–5.81)
RDW: 18.6 % — ABNORMAL HIGH (ref 11.5–15.5)
WBC: 6 10*3/uL (ref 4.0–10.5)

## 2016-05-21 LAB — RENAL FUNCTION PANEL
Albumin: 2.9 g/dL — ABNORMAL LOW (ref 3.5–5.0)
Anion gap: 12 (ref 5–15)
BUN: 34 mg/dL — ABNORMAL HIGH (ref 6–20)
CO2: 27 mmol/L (ref 22–32)
Calcium: 9.9 mg/dL (ref 8.9–10.3)
Chloride: 97 mmol/L — ABNORMAL LOW (ref 101–111)
Creatinine, Ser: 6.74 mg/dL — ABNORMAL HIGH (ref 0.61–1.24)
GFR calc Af Amer: 11 mL/min — ABNORMAL LOW (ref >60)
GFR calc non Af Amer: 10 mL/min — ABNORMAL LOW (ref >60)
Glucose, Bld: 134 mg/dL — ABNORMAL HIGH (ref 65–99)
Phosphorus: 5.6 mg/dL — ABNORMAL HIGH (ref 2.5–4.6)
Potassium: 3.8 mmol/L (ref 3.5–5.1)
Sodium: 136 mmol/L (ref 135–145)

## 2016-05-21 MED ORDER — METOPROLOL SUCCINATE ER 100 MG PO TB24: 100.0000 mg | ORAL_TABLET | Freq: Every day | ORAL | 0 refills | Status: DC

## 2016-05-21 MED ORDER — HYDRALAZINE HCL 25 MG PO TABS: 75.0000 mg | ORAL_TABLET | Freq: Three times a day (TID) | ORAL | 0 refills | Status: DC

## 2016-05-21 MED ORDER — NIFEDIPINE ER 60 MG PO TB24: 60.0000 mg | ORAL_TABLET | Freq: Every day | ORAL | 0 refills | Status: DC

## 2016-05-21 MED ORDER — METOPROLOL SUCCINATE ER 100 MG PO TB24
100.0000 mg | ORAL_TABLET | Freq: Every day | ORAL | Status: DC
  Administered 2016-05-21: 100 mg via ORAL
  Filled 2016-05-21: qty 1

## 2016-05-21 MED ORDER — TOPIRAMATE 50 MG PO TABS: 50.0000 mg | ORAL_TABLET | Freq: Two times a day (BID) | ORAL | 0 refills | Status: DC

## 2016-05-21 NOTE — Progress Notes (Signed)
Ambulated in the hallway. SPO2 96%  at the start drop to 94% during ambulation.

## 2016-05-21 NOTE — Progress Notes (Signed)
Pt discharged home. Questions answered. IV and tele removed.

## 2016-05-21 NOTE — Progress Notes (Addendum)
[facility] Progress Note   Subjective: HD yest with 2.5 L off.  BP's were normal this am.  Then BP went back up later in the day .  He refused 2 doses of hydralazine and the one daily dose of losartan yesterday when his BP's were controlled.     Vitals:   05/20/16 2044 05/20/16 2242 05/21/16 0246 05/21/16 0535  BP: (!) 146/96 112/71 122/61 121/76  Pulse: (!) 114 (!) 112 (!) 108 100  Resp: 19 18 16 16   Temp: 98.9 F (37.2 C)   98.4 F (36.9 C)  TempSrc: Oral   Oral  SpO2: 95% 95% 97% 97%  Weight:    65 kg (143 lb 4.8 oz)  Height:        Inpatient medications: . abacavir  600 mg Oral Q supper  . azithromycin  1,200 mg Oral Weekly  . dapsone  100 mg Oral Daily  . darbepoetin (ARANESP) injection - DIALYSIS  200 mcg Intravenous Q Sat-HD  . darunavir-cobicistat  1 tablet Oral QPM  . dolutegravir  50 mg Oral QPM  . doxercalciferol  6 mcg Intravenous Q T,Th,Sa-HD  . ferric gluconate (FERRLECIT/NULECIT) IV  125 mg Intravenous Q Thu-HD  . heparin  5,000 Units Subcutaneous Q8H  . hydrALAZINE  75 mg Oral TID  . hydrocerin   Topical BID  . hydrocortisone cream   Topical BID  . levETIRAcetam  250 mg Oral Once per day on Tue Thu Sat  . levETIRAcetam  500 mg Oral Daily  . losartan  100 mg Oral QHS  . metoprolol succinate  100 mg Oral Daily  . multivitamin  1 tablet Oral QHS  . NIFEdipine  60 mg Oral Daily  . sodium chloride flush  3 mL Intravenous Q12H  . sucroferric oxyhydroxide  500 mg Oral TID WC  . topiramate  50 mg Oral BID  . topiramate  50 mg Oral Once per day on Tue Thu Sat  . vitamin C  500 mg Oral Daily    acetaminophen **OR** acetaminophen, calcium carbonate, camphor-menthol, diphenhydrAMINE, hydrALAZINE, levalbuterol, promethazine, simethicone  Exam: Alert, no distress NO jvd Chest dec'd L base, R clear RRR  Abd soft ntnd Ext no edema  LUE AVF+bruit  Dialysis: [id number]  3h 14mins   450/800  2/2   EDW 65.5kg  L AVF  Heparin none  -Hectorol 6  mcg IV q HD -Mircera 200 mcg IV 2 weeks (last dose 3/10)  -Venofer 50 mg IV q week (to start 3/22)      Assessment: 1. Headaches - sp LP, results negative. On Topomax. Has chronic vol overload and HTN that may be contributing. Pt asked about doing HD 4 times per week for 3 hrs instead of 4hrs three days per week.  Spoke with his primary nephrologist, Dr Lorrene Reid, she will look into this possibility but not optimistic that [facility] will approve.  2. HTN - labile BP control in this pt.  BP much better today, may be somewhat due to volume as he was 6-7kg up on admission and is now at dry wt.  Was on 2 BP meds at home, now on 4.   He refused 2 of 4 bp meds yesterday due to normal BP's which may be why it is high this afternoon.  3. ESRD - cont HD TTS 4. CP/ SOB - resolved, +flat ^trop, no indication for cath per cards 5. Vol overload / vasc congestion - repeat CXR today other than CM looks normal.  6. Nonadherence - an issue w HD, pt says it's because of late HD headaches 7. HIV/ AIDS- counts are low 8. Hist of DVT 9. Seizure d/o - on Keppra 10. 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 Dispo - per primary team. OK for dc from renal standpoint.    Plan - as above.  Kelly Splinter MD [facility] pager [phone]   05/21/2016, 9:34 AM    Recent Labs Lab 05/18/16 1342 05/19/16 0718 05/20/16 0506  NA 134* 137 138  K 4.2 5.2* 5.2*  CL 99* 100* 98*  CO2 27 27 27   GLUCOSE 115* 77 79  BUN 12 21* 25*  CREATININE 5.20* 8.19* 6.98*  CALCIUM 8.5* 9.7 9.8  PHOS 2.7 4.8* 5.6*    Recent Labs Lab 05/18/16 1342 05/19/16 0718 05/20/16 0506  ALBUMIN 2.7* 2.8* 2.7*    Recent Labs Lab 05/19/16 0253 05/20/16 0506 05/21/16 0913  WBC 10.9* 8.1 6.0  HGB 10.5* 9.9* 10.1*  HCT 33.9* 32.3* 32.2*  MCV 93.6 93.6 92.5  PLT 176 183 194   Iron/TIBC/Ferritin/ %Sat    Component Value Date/Time   IRON 59 01/12/2015 1617   TIBC 248 (L) 01/12/2015  1617   FERRITIN 1,050 (H) 02/01/2016 1836   IRONPCTSAT 24 01/12/2015 1617

## 2016-05-21 NOTE — Progress Notes (Signed)
Family Medicine Teaching Service Daily Progress Note Intern Pager: [phone]  Patient name: Jim Wyatt Medical record number: [id number] Date of birth: [birth year] Age: 36 y.o. Gender: male  Primary Care Provider: [name], MD Consultants: Nephro  Code Status: FULL   Pt Overview and Major Events to Date:  3/16: Admitted to [facility]  3/17: HD   Assessment and Plan: Jim Wyatt is a 36 y.o. male presenting with one day of worsening shortness of breath with changes in vision. PMH is significant for poorly controlled hypertension, HIV, end-stage renal disease on dialysis (T/TH/S) and history of DVT x 1 in 2015 (not on any anti-coagulation).  Headache with changes in vision, resolved:   LP culture with no growth, normal cell count and negative cyptococcal ag. Headache resolved this AM.  -CSF toxoplasmosis ag pending - ID consulted; appreciate assistance (signed off): unlikely infectious cause. Await viral load, HIV genotype and resistance assays. Follow up in clinic in early April  - tylenol PRN headache - neuro checks q4hrs - cont topamax  Shortness of breath with chest pain, resolved: On room air now.  Likely due to volume overload in the setting of shortened HD and this is resolved with HD. Lungs clear, denying symptoms aside from congestion.  - repeat echo with normal EF and G1DD and slightly improved pericardial effusion has slightly improved now mild circumferential without evidence of tamponade - cardiology consulted, appreciate assistance (signed off) - oxpenex PRN, given tachycardia - telemetry and continuous pulse ox  - HIV RNA quant - continue home azithromycin (once per week on Monday) and dapsone for HIV opportunistic infection prophylaxis  Renovascular hypertension with hypertensive urgency, improving: Last HD on 2/24.  Yesterday Nephrology changed metoprolol to labetalol and Cardiology changed to toprol XL and increased hydralazine. Received one dose of  labetalol yesterday.  Blood pressure controlled with nifedipine 60mg , toprol XL 100mg , hydralazine 75mg  TID and losartan 100mg .   - increased hydralazine to 75mg  TID - cont losartan 100 mg daily - cont nifedipine 60mg  daily  - changed to metoprolol XL 100mg  started yesterday - hydralazine 5mg  IV PRN for SBP >180 or DBP >110 - continuous cardiac monitoring  End-stage renal disease: Received HD yesterday.  Pt asked about 4x weekly instead of 3x. Nephrology not optimistic about this happening.  Spoke with Dr. Melvia Heaps this AM who feels patient is appropriate for DC.  - nephrology consulted;appreciate assistance - daily renal function panel - palliative c/s- patient wishes to continue receiving dialysis, but will stop if becomes too painful  Anemia of Chronic Disease:  - Aranesp - Ferric Gluconate  HIV disease/advanced AIDS: RNA quant and integrase pending. NGTD CSF cultures. Scheduled follow up with Dr. Graylon Good 05/30/16  - f/u CSF culture - continue abicavir 300mg  BID, dapsone 100mg  and tivicay 50mg  daily, Prezbobix 800-150mg  nightly  - f/u genosure integrase and RNA ultraquant reflex to genotype and RNA quant  Abd pain, stable:  Continues with appropriate appetite.  -continue to monitor  Seizure disorder, stable: Last seizure in January. Has only had 2 seizures in his lifetime. Taking keppra every day.  - continue keppra 500mg  daily and an additional 250mg  on dialysis days (T/TH/S) - discontinued tramadol  Patient concern for OSA: Supposed to have sleep study at the end of this month. History of obesity, but has lost >50 pounds d/t renal disease and HIV/advanced AIDS. Does not currently use CPAP.  - sleep study as an outpatient  Asthma: Takes albuterol. Has reportedly been using it around four times  daily recently for shortness of breath. Denies tobacco.  - duonebs PRN - consider controller med - outpatient PFTs   FEN/GI: renal diet with fluid restriction Prophylaxis:  subcutaneous heparin  Disposition: pending improvement with headache and infectious r/o  Subjective:  Feels well this morning. Denying any pain. Asking about when he can go home and when he can go back to work.  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   Objective: Temp:  [98.4 F (36.9 C)-98.9 F (37.2 C)] 98.4 F (36.9 C) (03/25 0535) Pulse Rate:  [100-127] 100 (03/25 0535) Resp:  [14-20] 16 (03/25 0535) BP: (112-201)/(61-126) 121/76 (03/25 0535) SpO2:  [95 %-100 %] 97 % (03/25 0535) Weight:  [142 lb 10.2 oz (64.7 kg)-148 lb 9.4 oz (67.4 kg)] 143 lb 4.8 oz (65 kg) (03/25 0535) Physical Exam: General: lying in bed in NAD with [place] in place (oxygen off) Cardiovascular: tachycardic, RR, S4 and 2+ palpable pulses Respiratory: NWOB, CTABL Abdomen: +BS, Soft, NTND  Extremities: No LE edema Neuro:no focal neurological deficits.  Normal speech and though content.   Laboratory:  Recent Labs Lab 05/18/16 0424 05/19/16 0253 05/20/16 0506  WBC 7.3 10.9* 8.1  HGB 10.2* 10.5* 9.9*  HCT 31.6* 33.9* 32.3*  PLT 189 176 183    Recent Labs Lab 05/18/16 1342 05/19/16 0718 05/20/16 0506  NA 134* 137 138  K 4.2 5.2* 5.2*  CL 99* 100* 98*  CO2 27 27 27   BUN 12 21* 25*  CREATININE 5.20* 8.19* 6.98*  CALCIUM 8.5* 9.7 9.8  GLUCOSE 115* 77 79     Imaging/Diagnostic Tests: Dg Chest 2 View  Result Date: 05/12/2016 CLINICAL DATA:  Shortness of breath and chest pain EXAM: CHEST  2 VIEW COMPARISON:  04/27/2016 FINDINGS: Cardiac shadow remains enlarged. Mild increased vascular congestion is noted without significant edema or effusion. No focal infiltrate is identified. No bony abnormality is seen. IMPRESSION: Changes consistent with mild central vascular congestion. Electronically Signed   By: Inez Catalina M.D.   On: 05/12/2016 08:11   Ct Head Wo Contrast  Result Date: 05/09/2016 CLINICAL DATA:  Headaches EXAM: CT HEAD WITHOUT CONTRAST TECHNIQUE: Contiguous axial images  were obtained from the base of the skull through the vertex without intravenous contrast. COMPARISON:  01/17/2016 FINDINGS: Brain: No evidence of acute infarction, hemorrhage, hydrocephalus, extra-axial collection or mass lesion/mass effect. Vascular: No hyperdense vessel or unexpected calcification. Skull: Normal. Negative for fracture or focal lesion. Sinuses/Orbits: Mucosal thickening is noted in the maxillary antra bilaterally stable from the previous exam. Other: None IMPRESSION: No acute intracranial abnormality noted. Mucosal thickening in the maxillary antra bilaterally. Electronically Signed   By: Inez Catalina M.D.   On: 05/09/2016 14:25   Ct Angio Chest Pe W Or Wo Contrast  Result Date: 05/12/2016 CLINICAL DATA:  Chest pain and shortness of breath since yesterday. EXAM: CT ANGIOGRAPHY CHEST WITH CONTRAST TECHNIQUE: Multidetector CT imaging of the chest was performed using the standard protocol during bolus administration of intravenous contrast. Multiplanar CT image reconstructions and MIPs were obtained to evaluate the vascular anatomy. CONTRAST:  180 cc of Isovue 370 COMPARISON:  Chest radiographs dated 05/12/2016. FINDINGS: Cardiovascular: Initial images obtained with 100 cc of Isovue 370 did not demonstrate adequate opacification of the pulmonary arteries. Therefore, repeat images were obtained with an additional 80 cc of Isovue 370. These demonstrate normally opacified pulmonary arteries with no pulmonary arterial filling defects. The heart is enlarged. The pericardial effusion is demonstrated, measuring 1.9 cm  in maximum thickness. Mediastinum/Nodes: No enlarged mediastinal, hilar, or axillary lymph nodes. Thyroid gland, trachea, and esophagus demonstrate no significant findings. Lungs/Pleura: Prominent pulmonary vasculature. Small left pleural effusion. Upper Abdomen: No acute abnormality. Musculoskeletal: Minimal thoracic spine degenerative changes. Review of the MIP images confirms the above  findings. IMPRESSION: 1. No pulmonary emboli. 2. Small to moderate-sized pericardial effusion. 3. Small left pleural effusion. 4. Cardiomegaly and pulmonary vascular congestion. Electronically Signed   By: Claudie Revering M.D.   On: 05/12/2016 16:03   ECHO 05/19/16: EF 55-60%,normal wall motion, G1DD, pericardial effusion slightly improved now mild circumferential and no evidence of tamponade.   Eloise Levels, MD 05/21/2016, 8:35 AM PGY-2, [place] Intern pager: [phone], text pages welcome

## 2016-05-21 NOTE — Progress Notes (Signed)
Pt Bed elevated refused to lower explained to the patient the safety concerns in having bed elevated.

## 2016-05-21 NOTE — Progress Notes (Signed)
Patient slept majority of the night. Denies headache or any other pain. HR on the monitor around 100 bpm. VS signs stable during the night. Patient informed about his morning VS and scheduled morning medication. Patient refused his morning medication again, stating that his blood pressure and other vitals are normal and there is no need for medication. Patient educated prior about every medication that is scheduled. Patient currently resting comfortably in the bed. Will continue to monitor.  Yamina Lenis, RN

## 2016-05-21 NOTE — Discharge Instructions (Signed)
You were admitted to the hospital for headaches with vision changes and shortness of breath.  We did a lumbar puncture that fortunately did not show any infections.  We made some changes to your blood pressure medication. Please see the medication list. We highly recommend that you continue to follow up with the infectious disease clinic for your viral infection and please continue going to dialysis and finish your sessions.   Please schedule a follow up appointment with your primary doctor.

## 2016-05-21 NOTE — Progress Notes (Signed)
Progress Note  Patient Name: Jim Wyatt Date of Encounter: 05/21/2016  Primary Cardiologist: Dr. Harrington Challenger (new)  Subjective   No pain.  Breathing he says is back to baseline.  He wants to go home.  Inpatient Medications    Scheduled Meds: . abacavir  600 mg Oral Q supper  . azithromycin  1,200 mg Oral Weekly  . dapsone  100 mg Oral Daily  . darbepoetin (ARANESP) injection - DIALYSIS  200 mcg Intravenous Q Sat-HD  . darunavir-cobicistat  1 tablet Oral QPM  . dolutegravir  50 mg Oral QPM  . doxercalciferol  6 mcg Intravenous Q T,Th,Sa-HD  . ferric gluconate (FERRLECIT/NULECIT) IV  125 mg Intravenous Q Thu-HD  . heparin  5,000 Units Subcutaneous Q8H  . hydrALAZINE  75 mg Oral TID  . hydrocerin   Topical BID  . hydrocortisone cream   Topical BID  . levETIRAcetam  250 mg Oral Once per day on Tue Thu Sat  . levETIRAcetam  500 mg Oral Daily  . losartan  100 mg Oral QHS  . metoprolol succinate  100 mg Oral Daily  . multivitamin  1 tablet Oral QHS  . NIFEdipine  60 mg Oral Daily  . nitroGLYCERIN  1 inch Topical Q6H  . sodium chloride flush  3 mL Intravenous Q12H  . sucroferric oxyhydroxide  500 mg Oral TID WC  . topiramate  50 mg Oral BID  . topiramate  50 mg Oral Once per day on Tue Thu Sat  . vitamin C  500 mg Oral Daily   Continuous Infusions:  PRN Meds: acetaminophen **OR** acetaminophen, calcium carbonate, camphor-menthol, diphenhydrAMINE, hydrALAZINE, levalbuterol, promethazine, simethicone   Vital Signs    Vitals:   05/20/16 2044 05/20/16 2242 05/21/16 0246 05/21/16 0535  BP: (!) 146/96 112/71 122/61 121/76  Pulse: (!) 114 (!) 112 (!) 108 100  Resp: 19 18 16 16   Temp: 98.9 F (37.2 C)   98.4 F (36.9 C)  TempSrc: Oral   Oral  SpO2: 95% 95% 97% 97%  Weight:    143 lb 4.8 oz (65 kg)  Height:        Intake/Output Summary (Last 24 hours) at 05/21/16 0839 Last data filed at 05/21/16 0555  Gross per 24 hour  Intake              720 ml  Output              2500 ml  Net            -1780 ml   Filed Weights   05/20/16 1155 05/20/16 1506 05/21/16 0535  Weight: 148 lb 9.4 oz (67.4 kg) 142 lb 10.2 oz (64.7 kg) 143 lb 4.8 oz (65 kg)    Telemetry    NSR - Personally Reviewed  ECG    NA - Personally Reviewed  Physical Exam   GEN: No acute distress.   Neck: No  JVD Cardiac: RRR, no murmurs, rubs, or gallops.  Respiratory: Clear  to auscultation bilaterally. GI: Soft, nontender, non-distended  MS: No  Edema. Neuro:  Nonfocal  Psych: Angry demeanor.  Labs    Chemistry  Recent Labs Lab 05/18/16 1342 05/19/16 0718 05/20/16 0506  NA 134* 137 138  K 4.2 5.2* 5.2*  CL 99* 100* 98*  CO2 27 27 27   GLUCOSE 115* 77 79  BUN 12 21* 25*  CREATININE 5.20* 8.19* 6.98*  CALCIUM 8.5* 9.7 9.8  ALBUMIN 2.7* 2.8* 2.7*  GFRNONAA 13* 8* 9*  GFRAA 15* 9* 11*  ANIONGAP 8 10 13      Hematology  Recent Labs Lab 05/18/16 0424 05/19/16 0253 05/20/16 0506  WBC 7.3 10.9* 8.1  RBC 3.42* 3.62* 3.45*  HGB 10.2* 10.5* 9.9*  HCT 31.6* 33.9* 32.3*  MCV 92.4 93.6 93.6  MCH 29.8 29.0 28.7  MCHC 32.3 31.0 30.7  RDW 20.3* 19.8* 19.1*  PLT 189 176 183    Cardiac Enzymes  Recent Labs Lab 05/18/16 1342 05/18/16 1618 05/19/16 0253  TROPONINI 0.50* 0.48* 0.40*   No results for input(s): TROPIPOC in the last 168 hours.   BNPNo results for input(s): BNP, PROBNP in the last 168 hours.   DDimer No results for input(s): DDIMER in the last 168 hours.   Radiology    Dg Chest Port 1 View  Result Date: 05/21/2016 CLINICAL DATA:  Pulmonary edema. EXAM: PORTABLE CHEST 1 VIEW COMPARISON:  05/18/2016. FINDINGS: This cardiomediastinal silhouette is increased, which could be due to pericardial effusion or increasing cardiomegaly with low lung volumes. Mild edema for cysts without significant worsening or improvement. No effusion or pneumothorax. Bones unremarkable. IMPRESSION: Increased cardiomediastinal silhouette.  No change edema. Electronically  Signed   By: Staci Righter M.D.   On: 05/21/2016 07:09    Cardiac Studies   ECHO (05/19/16) Study Conclusions  - Left ventricle: The cavity size was normal. There was severe   concentric hypertrophy. Systolic function was normal. The   estimated ejection fraction was in the range of 55% to 60%. Wall   motion was normal; there were no regional wall motion   abnormalities. Doppler parameters are consistent with abnormal   left ventricular relaxation (grade 1 diastolic dysfunction).   There was no evidence of elevated ventricular filling pressure by   Doppler parameters. - Aortic valve: There was trivial regurgitation. - Mitral valve: There was mild regurgitation. - Left atrium: The atrium was mildly dilated. - Right ventricle: Systolic function was normal. - Right atrium: The atrium was mildly dilated. - Tricuspid valve: There was mild regurgitation. - Inferior vena cava: The vessel was normal in size. - Pericardium, extracardiac: There was no pericardial effusion.  Patient Profile     36 y.o. male with a history of ESRD on HD, DVT in 2015, uncontrolled HTN, HIV, GID and seizure  who came to  ER 05/12/16 for progressive worsening of shortness of breath, chest pain and elevated blood pressure.  Assessment & Plan    ELEVATED TROPONIN/CHEST PAIN:  Related to volume, HTN.  No plan for ischemia work up.   Troponin trend is flat and in this situation is not diagnostic of ACS.    ACUTE ON CHRONIC DIASTOLIC HF:  2671 CC removed with dialysis yesterday.  Volume management per renal.    HTN:  Changed to Toprol XL and increased hydralazine yesterday.  BP is improved.  I discontinued the NTG paste.  He refused some of his medications because his BP was normal.  I suspect that BP control will be difficult secondary to non compliance.   We will sign off.  Signed, Minus Breeding, MD  05/21/2016, 8:39 AM

## 2016-05-22 ENCOUNTER — Ambulatory Visit (HOSPITAL_BASED_OUTPATIENT_CLINIC_OR_DEPARTMENT_OTHER): Payer: Medicare Other | Attending: Internal Medicine | Admitting: Internal Medicine

## 2016-05-22 VITALS — Ht 67.0 in | Wt 150.0 lb

## 2016-05-22 DIAGNOSIS — R000 Tachycardia, unspecified: Secondary | ICD-10-CM | POA: Diagnosis not present

## 2016-05-22 DIAGNOSIS — R0683 Snoring: Secondary | ICD-10-CM | POA: Insufficient documentation

## 2016-05-22 DIAGNOSIS — G4733 Obstructive sleep apnea (adult) (pediatric): Secondary | ICD-10-CM | POA: Insufficient documentation

## 2016-05-22 LAB — MISC LABCORP TEST (SEND OUT): Labcorp test code: 138602

## 2016-05-23 DIAGNOSIS — D631 Anemia in chronic kidney disease: Secondary | ICD-10-CM | POA: Diagnosis not present

## 2016-05-23 DIAGNOSIS — N2581 Secondary hyperparathyroidism of renal origin: Secondary | ICD-10-CM | POA: Diagnosis not present

## 2016-05-23 DIAGNOSIS — N186 End stage renal disease: Secondary | ICD-10-CM | POA: Diagnosis not present

## 2016-05-25 DIAGNOSIS — N186 End stage renal disease: Secondary | ICD-10-CM | POA: Diagnosis not present

## 2016-05-25 DIAGNOSIS — N2581 Secondary hyperparathyroidism of renal origin: Secondary | ICD-10-CM | POA: Diagnosis not present

## 2016-05-25 DIAGNOSIS — D631 Anemia in chronic kidney disease: Secondary | ICD-10-CM | POA: Diagnosis not present

## 2016-05-27 DIAGNOSIS — N2581 Secondary hyperparathyroidism of renal origin: Secondary | ICD-10-CM | POA: Diagnosis not present

## 2016-05-27 DIAGNOSIS — D631 Anemia in chronic kidney disease: Secondary | ICD-10-CM | POA: Diagnosis not present

## 2016-05-27 DIAGNOSIS — N186 End stage renal disease: Secondary | ICD-10-CM | POA: Diagnosis not present

## 2016-05-27 DIAGNOSIS — N049 Nephrotic syndrome with unspecified morphologic changes: Secondary | ICD-10-CM | POA: Diagnosis not present

## 2016-05-27 DIAGNOSIS — Z992 Dependence on renal dialysis: Secondary | ICD-10-CM | POA: Diagnosis not present

## 2016-05-29 ENCOUNTER — Encounter (HOSPITAL_COMMUNITY): Payer: Self-pay

## 2016-05-29 ENCOUNTER — Inpatient Hospital Stay (HOSPITAL_COMMUNITY)
Admission: EM | Admit: 2016-05-29 | Discharge: 2016-06-02 | DRG: 974 | Disposition: A | Discharge status: Home / Self Care | Payer: Medicare Other | Attending: Family Medicine | Admitting: Family Medicine

## 2016-05-29 DIAGNOSIS — E873 Alkalosis: Secondary | ICD-10-CM | POA: Diagnosis present

## 2016-05-29 DIAGNOSIS — J9600 Acute respiratory failure, unspecified whether with hypoxia or hypercapnia: Secondary | ICD-10-CM | POA: Diagnosis present

## 2016-05-29 DIAGNOSIS — Z9119 Patient's noncompliance with other medical treatment and regimen: Secondary | ICD-10-CM

## 2016-05-29 DIAGNOSIS — J45909 Unspecified asthma, uncomplicated: Secondary | ICD-10-CM | POA: Diagnosis present

## 2016-05-29 DIAGNOSIS — Z8249 Family history of ischemic heart disease and other diseases of the circulatory system: Secondary | ICD-10-CM

## 2016-05-29 DIAGNOSIS — Z8619 Personal history of other infectious and parasitic diseases: Secondary | ICD-10-CM

## 2016-05-29 DIAGNOSIS — I160 Hypertensive urgency: Secondary | ICD-10-CM

## 2016-05-29 DIAGNOSIS — E8889 Other specified metabolic disorders: Secondary | ICD-10-CM | POA: Diagnosis present

## 2016-05-29 DIAGNOSIS — A419 Sepsis, unspecified organism: Secondary | ICD-10-CM | POA: Diagnosis not present

## 2016-05-29 DIAGNOSIS — I313 Pericardial effusion (noninflammatory): Secondary | ICD-10-CM | POA: Diagnosis present

## 2016-05-29 DIAGNOSIS — G4733 Obstructive sleep apnea (adult) (pediatric): Secondary | ICD-10-CM | POA: Diagnosis present

## 2016-05-29 DIAGNOSIS — J9601 Acute respiratory failure with hypoxia: Secondary | ICD-10-CM | POA: Diagnosis present

## 2016-05-29 DIAGNOSIS — Z86718 Personal history of other venous thrombosis and embolism: Secondary | ICD-10-CM

## 2016-05-29 DIAGNOSIS — B20 Human immunodeficiency virus [HIV] disease: Secondary | ICD-10-CM | POA: Diagnosis not present

## 2016-05-29 DIAGNOSIS — B59 Pneumocystosis: Secondary | ICD-10-CM | POA: Diagnosis not present

## 2016-05-29 DIAGNOSIS — I517 Cardiomegaly: Secondary | ICD-10-CM | POA: Diagnosis present

## 2016-05-29 DIAGNOSIS — G40909 Epilepsy, unspecified, not intractable, without status epilepticus: Secondary | ICD-10-CM | POA: Diagnosis present

## 2016-05-29 DIAGNOSIS — Z8661 Personal history of infections of the central nervous system: Secondary | ICD-10-CM

## 2016-05-29 DIAGNOSIS — D631 Anemia in chronic kidney disease: Secondary | ICD-10-CM | POA: Diagnosis present

## 2016-05-29 DIAGNOSIS — R000 Tachycardia, unspecified: Secondary | ICD-10-CM | POA: Diagnosis present

## 2016-05-29 DIAGNOSIS — N186 End stage renal disease: Secondary | ICD-10-CM | POA: Diagnosis present

## 2016-05-29 DIAGNOSIS — R6510 Systemic inflammatory response syndrome (SIRS) of non-infectious origin without acute organ dysfunction: Secondary | ICD-10-CM | POA: Diagnosis present

## 2016-05-29 DIAGNOSIS — E8770 Fluid overload, unspecified: Secondary | ICD-10-CM | POA: Diagnosis present

## 2016-05-29 DIAGNOSIS — Z9889 Other specified postprocedural states: Secondary | ICD-10-CM

## 2016-05-29 DIAGNOSIS — I120 Hypertensive chronic kidney disease with stage 5 chronic kidney disease or end stage renal disease: Secondary | ICD-10-CM | POA: Diagnosis present

## 2016-05-29 DIAGNOSIS — N2581 Secondary hyperparathyroidism of renal origin: Secondary | ICD-10-CM | POA: Diagnosis present

## 2016-05-29 DIAGNOSIS — J810 Acute pulmonary edema: Secondary | ICD-10-CM | POA: Diagnosis not present

## 2016-05-29 DIAGNOSIS — Z833 Family history of diabetes mellitus: Secondary | ICD-10-CM

## 2016-05-29 DIAGNOSIS — Z87441 Personal history of nephrotic syndrome: Secondary | ICD-10-CM

## 2016-05-29 DIAGNOSIS — Z91199 Patient's noncompliance with other medical treatment and regimen due to unspecified reason: Secondary | ICD-10-CM

## 2016-05-29 DIAGNOSIS — Z992 Dependence on renal dialysis: Secondary | ICD-10-CM

## 2016-05-29 DIAGNOSIS — Z79899 Other long term (current) drug therapy: Secondary | ICD-10-CM

## 2016-05-29 DIAGNOSIS — I10 Essential (primary) hypertension: Secondary | ICD-10-CM | POA: Diagnosis not present

## 2016-05-29 DIAGNOSIS — R069 Unspecified abnormalities of breathing: Secondary | ICD-10-CM | POA: Diagnosis not present

## 2016-05-29 DIAGNOSIS — Z841 Family history of disorders of kidney and ureter: Secondary | ICD-10-CM

## 2016-05-29 DIAGNOSIS — J811 Chronic pulmonary edema: Secondary | ICD-10-CM | POA: Diagnosis present

## 2016-05-29 LAB — HIV-1 RNA ULTRAQUANT REFLEX TO GENTYP+
HIV-1 RNA BY PCR: 1620 copies/mL
HIV-1 RNA Quant, Log: 3.21 log10copy/mL

## 2016-05-29 LAB — REFLEX TO GENOSURE(R) MG

## 2016-05-29 LAB — GENOSURE INTEGRASE HIV EDI

## 2016-05-29 NOTE — ED Triage Notes (Signed)
Pt from home c/o shortness of breath. Pt gets dialysis t/th/sat. Last Dialysis on Saturday with full session. Pt became increasingly SOB throughout the day.  RR 36 100% on 4L [place] labored breathing. BP 200/140 HR120. Sinus rythym. Lung sounds clear in upper lobes and fluid in lower lobes. Dry cough. 5 albuterol with no relief,  Pt denies pain. [name]/ox4.

## 2016-05-30 ENCOUNTER — Emergency Department (HOSPITAL_COMMUNITY): Payer: Medicare Other

## 2016-05-30 ENCOUNTER — Encounter (HOSPITAL_COMMUNITY): Payer: Self-pay | Admitting: Radiology

## 2016-05-30 ENCOUNTER — Ambulatory Visit: Payer: Self-pay | Admitting: Internal Medicine

## 2016-05-30 DIAGNOSIS — Z888 Allergy status to other drugs, medicaments and biological substances status: Secondary | ICD-10-CM

## 2016-05-30 DIAGNOSIS — B20 Human immunodeficiency virus [HIV] disease: Secondary | ICD-10-CM

## 2016-05-30 DIAGNOSIS — J810 Acute pulmonary edema: Secondary | ICD-10-CM | POA: Diagnosis present

## 2016-05-30 DIAGNOSIS — N2581 Secondary hyperparathyroidism of renal origin: Secondary | ICD-10-CM | POA: Diagnosis present

## 2016-05-30 DIAGNOSIS — I517 Cardiomegaly: Secondary | ICD-10-CM | POA: Diagnosis present

## 2016-05-30 DIAGNOSIS — B59 Pneumocystosis: Secondary | ICD-10-CM | POA: Diagnosis not present

## 2016-05-30 DIAGNOSIS — Z9119 Patient's noncompliance with other medical treatment and regimen: Secondary | ICD-10-CM

## 2016-05-30 DIAGNOSIS — Z8249 Family history of ischemic heart disease and other diseases of the circulatory system: Secondary | ICD-10-CM

## 2016-05-30 DIAGNOSIS — I313 Pericardial effusion (noninflammatory): Secondary | ICD-10-CM | POA: Diagnosis present

## 2016-05-30 DIAGNOSIS — J9601 Acute respiratory failure with hypoxia: Secondary | ICD-10-CM

## 2016-05-30 DIAGNOSIS — Z992 Dependence on renal dialysis: Secondary | ICD-10-CM | POA: Diagnosis not present

## 2016-05-30 DIAGNOSIS — N186 End stage renal disease: Secondary | ICD-10-CM | POA: Diagnosis not present

## 2016-05-30 DIAGNOSIS — G4733 Obstructive sleep apnea (adult) (pediatric): Secondary | ICD-10-CM | POA: Diagnosis not present

## 2016-05-30 DIAGNOSIS — I120 Hypertensive chronic kidney disease with stage 5 chronic kidney disease or end stage renal disease: Secondary | ICD-10-CM | POA: Diagnosis present

## 2016-05-30 DIAGNOSIS — J45909 Unspecified asthma, uncomplicated: Secondary | ICD-10-CM | POA: Diagnosis present

## 2016-05-30 DIAGNOSIS — Z86718 Personal history of other venous thrombosis and embolism: Secondary | ICD-10-CM | POA: Diagnosis not present

## 2016-05-30 DIAGNOSIS — R6510 Systemic inflammatory response syndrome (SIRS) of non-infectious origin without acute organ dysfunction: Secondary | ICD-10-CM | POA: Diagnosis present

## 2016-05-30 DIAGNOSIS — Z9114 Patient's other noncompliance with medication regimen: Secondary | ICD-10-CM

## 2016-05-30 DIAGNOSIS — Z9889 Other specified postprocedural states: Secondary | ICD-10-CM | POA: Diagnosis not present

## 2016-05-30 DIAGNOSIS — Z841 Family history of disorders of kidney and ureter: Secondary | ICD-10-CM | POA: Diagnosis not present

## 2016-05-30 DIAGNOSIS — J811 Chronic pulmonary edema: Secondary | ICD-10-CM | POA: Diagnosis present

## 2016-05-30 DIAGNOSIS — Z91199 Patient's noncompliance with other medical treatment and regimen due to unspecified reason: Secondary | ICD-10-CM

## 2016-05-30 DIAGNOSIS — Z79899 Other long term (current) drug therapy: Secondary | ICD-10-CM | POA: Diagnosis not present

## 2016-05-30 DIAGNOSIS — Z833 Family history of diabetes mellitus: Secondary | ICD-10-CM | POA: Diagnosis not present

## 2016-05-30 DIAGNOSIS — A419 Sepsis, unspecified organism: Secondary | ICD-10-CM | POA: Diagnosis present

## 2016-05-30 DIAGNOSIS — E873 Alkalosis: Secondary | ICD-10-CM | POA: Diagnosis present

## 2016-05-30 DIAGNOSIS — J9600 Acute respiratory failure, unspecified whether with hypoxia or hypercapnia: Secondary | ICD-10-CM | POA: Diagnosis present

## 2016-05-30 DIAGNOSIS — Z881 Allergy status to other antibiotic agents status: Secondary | ICD-10-CM | POA: Diagnosis not present

## 2016-05-30 DIAGNOSIS — G40909 Epilepsy, unspecified, not intractable, without status epilepticus: Secondary | ICD-10-CM | POA: Diagnosis present

## 2016-05-30 DIAGNOSIS — E8770 Fluid overload, unspecified: Secondary | ICD-10-CM | POA: Diagnosis present

## 2016-05-30 DIAGNOSIS — R000 Tachycardia, unspecified: Secondary | ICD-10-CM | POA: Diagnosis present

## 2016-05-30 LAB — RENAL FUNCTION PANEL
Albumin: 3 g/dL — ABNORMAL LOW (ref 3.5–5.0)
Anion gap: 10 (ref 5–15)
BUN: 29 mg/dL — ABNORMAL HIGH (ref 6–20)
CO2: 27 mmol/L (ref 22–32)
Calcium: 8.9 mg/dL (ref 8.9–10.3)
Chloride: 97 mmol/L — ABNORMAL LOW (ref 101–111)
Creatinine, Ser: 6.96 mg/dL — ABNORMAL HIGH (ref 0.61–1.24)
GFR calc Af Amer: 11 mL/min — ABNORMAL LOW (ref >60)
GFR calc non Af Amer: 9 mL/min — ABNORMAL LOW (ref >60)
Glucose, Bld: 81 mg/dL (ref 65–99)
Phosphorus: 4.9 mg/dL — ABNORMAL HIGH (ref 2.5–4.6)
Potassium: 5.8 mmol/L — ABNORMAL HIGH (ref 3.5–5.1)
Sodium: 134 mmol/L — ABNORMAL LOW (ref 135–145)

## 2016-05-30 LAB — I-STAT ARTERIAL BLOOD GAS, ED
Acid-Base Excess: 4 mmol/L — ABNORMAL HIGH (ref 0.0–2.0)
Bicarbonate: 27 mmol/L (ref 20.0–28.0)
O2 Saturation: 96 %
Patient temperature: 101.8
TCO2: 28 mmol/L (ref 0–100)
pCO2 arterial: 36.1 mmHg (ref 32.0–48.0)
pH, Arterial: 7.487 — ABNORMAL HIGH (ref 7.350–7.450)
pO2, Arterial: 85 mmHg (ref 83.0–108.0)

## 2016-05-30 LAB — CBC WITH DIFFERENTIAL/PLATELET
Basophils Absolute: 0 10*3/uL (ref 0.0–0.1)
Basophils Relative: 1 %
Eosinophils Absolute: 0.2 10*3/uL (ref 0.0–0.7)
Eosinophils Relative: 4 %
HCT: 27.5 % — ABNORMAL LOW (ref 39.0–52.0)
Hemoglobin: 8.9 g/dL — ABNORMAL LOW (ref 13.0–17.0)
Lymphocytes Relative: 14 %
Lymphs Abs: 0.9 10*3/uL (ref 0.7–4.0)
MCH: 31.1 pg (ref 26.0–34.0)
MCHC: 32.4 g/dL (ref 30.0–36.0)
MCV: 96.2 fL (ref 78.0–100.0)
Monocytes Absolute: 0.3 10*3/uL (ref 0.1–1.0)
Monocytes Relative: 6 %
Neutro Abs: 4.7 10*3/uL (ref 1.7–7.7)
Neutrophils Relative %: 75 %
Platelets: 167 10*3/uL (ref 150–400)
RBC: 2.86 MIL/uL — ABNORMAL LOW (ref 4.22–5.81)
RDW: 21 % — ABNORMAL HIGH (ref 11.5–15.5)
WBC: 6.2 10*3/uL (ref 4.0–10.5)

## 2016-05-30 LAB — COMPREHENSIVE METABOLIC PANEL
ALT: 12 U/L — ABNORMAL LOW (ref 17–63)
AST: 22 U/L (ref 15–41)
Albumin: 3.2 g/dL — ABNORMAL LOW (ref 3.5–5.0)
Alkaline Phosphatase: 47 U/L (ref 38–126)
Anion gap: 16 — ABNORMAL HIGH (ref 5–15)
BUN: 64 mg/dL — ABNORMAL HIGH (ref 6–20)
CO2: 25 mmol/L (ref 22–32)
Calcium: 9.5 mg/dL (ref 8.9–10.3)
Chloride: 98 mmol/L — ABNORMAL LOW (ref 101–111)
Creatinine, Ser: 11.75 mg/dL — ABNORMAL HIGH (ref 0.61–1.24)
GFR calc Af Amer: 6 mL/min — ABNORMAL LOW (ref >60)
GFR calc non Af Amer: 5 mL/min — ABNORMAL LOW (ref >60)
Glucose, Bld: 81 mg/dL (ref 65–99)
Potassium: 6.2 mmol/L — ABNORMAL HIGH (ref 3.5–5.1)
Sodium: 139 mmol/L (ref 135–145)
Total Bilirubin: 1 mg/dL (ref 0.3–1.2)
Total Protein: 7.4 g/dL (ref 6.5–8.1)

## 2016-05-30 LAB — INFLUENZA PANEL BY PCR (TYPE A & B)
Influenza A By PCR: NEGATIVE
Influenza B By PCR: NEGATIVE

## 2016-05-30 LAB — I-STAT CG4 LACTIC ACID, ED
Lactic Acid, Venous: 1.22 mmol/L (ref 0.5–1.9)
Lactic Acid, Venous: 1.39 mmol/L (ref 0.5–1.9)

## 2016-05-30 LAB — LIPASE, BLOOD: Lipase: 36 U/L (ref 11–51)

## 2016-05-30 MED ORDER — VANCOMYCIN HCL IN DEXTROSE 1-5 GM/200ML-% IV SOLN: 1000.0000 mg | Freq: Once | INTRAVENOUS | Status: DC

## 2016-05-30 MED ORDER — ALBUTEROL SULFATE (2.5 MG/3ML) 0.083% IN NEBU: 2.5000 mg | INHALATION_SOLUTION | Freq: Four times a day (QID) | RESPIRATORY_TRACT | Status: DC | PRN

## 2016-05-30 MED ORDER — DOLUTEGRAVIR SODIUM 50 MG PO TABS
50.0000 mg | ORAL_TABLET | Freq: Every day | ORAL | Status: DC
  Administered 2016-05-30: 50 mg via ORAL
  Filled 2016-05-30: qty 1

## 2016-05-30 MED ORDER — METOPROLOL SUCCINATE ER 100 MG PO TB24
100.0000 mg | ORAL_TABLET | Freq: Every day | ORAL | Status: DC
  Filled 2016-05-30: qty 1

## 2016-05-30 MED ORDER — PREDNISONE 20 MG PO TABS: 40.0000 mg | ORAL_TABLET | Freq: Every day | ORAL | Status: DC

## 2016-05-30 MED ORDER — NIFEDIPINE ER 60 MG PO TB24
60.0000 mg | ORAL_TABLET | Freq: Every day | ORAL | Status: DC
  Filled 2016-05-30 (×3): qty 1

## 2016-05-30 MED ORDER — IPRATROPIUM-ALBUTEROL 0.5-2.5 (3) MG/3ML IN SOLN: 3.0000 mL | RESPIRATORY_TRACT | Status: DC | PRN

## 2016-05-30 MED ORDER — VITAMIN C 500 MG PO TABS
500.0000 mg | ORAL_TABLET | Freq: Every day | ORAL | Status: DC
  Filled 2016-05-30 (×2): qty 1

## 2016-05-30 MED ORDER — HYDRALAZINE HCL 20 MG/ML IJ SOLN
5.0000 mg | Freq: Four times a day (QID) | INTRAMUSCULAR | Status: DC | PRN
  Administered 2016-05-31: 5 mg via INTRAVENOUS
  Filled 2016-05-30: qty 1

## 2016-05-30 MED ORDER — LEVETIRACETAM 250 MG PO TABS
250.0000 mg | ORAL_TABLET | ORAL | Status: DC
  Administered 2016-05-30 – 2016-06-01 (×2): 250 mg via ORAL
  Filled 2016-05-30 (×3): qty 1

## 2016-05-30 MED ORDER — DEXTROSE 5 % IV SOLN
2.0000 g | Freq: Once | INTRAVENOUS | Status: AC
  Administered 2016-05-30: 2 g via INTRAVENOUS
  Filled 2016-05-30: qty 2

## 2016-05-30 MED ORDER — ACETAMINOPHEN 325 MG PO TABS
650.0000 mg | ORAL_TABLET | Freq: Four times a day (QID) | ORAL | Status: DC | PRN
  Administered 2016-05-30 (×3): 650 mg via ORAL
  Filled 2016-05-30 (×4): qty 2

## 2016-05-30 MED ORDER — EMTRICITABINE-TENOFOVIR AF 200-25 MG PO TABS
1.0000 | ORAL_TABLET | Freq: Every day | ORAL | Status: DC
  Administered 2016-05-30: 1 via ORAL
  Filled 2016-05-30 (×2): qty 1

## 2016-05-30 MED ORDER — ABACAVIR SULFATE 300 MG PO TABS
600.0000 mg | ORAL_TABLET | Freq: Every day | ORAL | Status: DC
  Filled 2016-05-30: qty 2

## 2016-05-30 MED ORDER — LIDOCAINE-PRILOCAINE 2.5-2.5 % EX CREA
1.0000 "application " | TOPICAL_CREAM | CUTANEOUS | Status: DC | PRN
  Filled 2016-05-30: qty 5

## 2016-05-30 MED ORDER — HEPARIN SODIUM (PORCINE) 1000 UNIT/ML DIALYSIS
1000.0000 [IU] | INTRAMUSCULAR | Status: DC | PRN
  Filled 2016-05-30: qty 1

## 2016-05-30 MED ORDER — DARBEPOETIN ALFA 200 MCG/0.4ML IJ SOSY: 200.0000 ug | PREFILLED_SYRINGE | INTRAMUSCULAR | Status: DC

## 2016-05-30 MED ORDER — LEVETIRACETAM 500 MG PO TABS
500.0000 mg | ORAL_TABLET | Freq: Every day | ORAL | Status: DC
  Administered 2016-05-30 – 2016-06-02 (×4): 500 mg via ORAL
  Filled 2016-05-30 (×3): qty 1

## 2016-05-30 MED ORDER — ONDANSETRON HCL 4 MG PO TABS
8.0000 mg | ORAL_TABLET | Freq: Three times a day (TID) | ORAL | Status: DC | PRN
  Administered 2016-05-30 – 2016-06-01 (×3): 8 mg via ORAL
  Filled 2016-05-30 (×3): qty 2

## 2016-05-30 MED ORDER — ALTEPLASE 2 MG IJ SOLR
2.0000 mg | Freq: Once | INTRAMUSCULAR | Status: DC | PRN
Start: 2016-05-30 — End: 2016-05-30

## 2016-05-30 MED ORDER — HYDROMORPHONE HCL 1 MG/ML IJ SOLN
INTRAMUSCULAR | Status: AC
  Filled 2016-05-30: qty 1

## 2016-05-30 MED ORDER — IOPAMIDOL (ISOVUE-370) INJECTION 76%
INTRAVENOUS | Status: AC
  Filled 2016-05-30: qty 100

## 2016-05-30 MED ORDER — LIDOCAINE HCL (PF) 1 % IJ SOLN: 5.0000 mL | INTRAMUSCULAR | Status: DC | PRN

## 2016-05-30 MED ORDER — VANCOMYCIN HCL 10 G IV SOLR
1250.0000 mg | Freq: Once | INTRAVENOUS | Status: DC
  Filled 2016-05-30: qty 1250

## 2016-05-30 MED ORDER — SULFAMETHOXAZOLE-TRIMETHOPRIM 400-80 MG/5ML IV SOLN
325.0000 mg | INTRAVENOUS | Status: DC
  Filled 2016-05-30: qty 20.3

## 2016-05-30 MED ORDER — DEXTROSE 5 % IV SOLN: 1.0000 g | Freq: Every day | INTRAVENOUS | Status: DC

## 2016-05-30 MED ORDER — HYDROMORPHONE HCL 1 MG/ML IJ SOLN
1.0000 mg | Freq: Once | INTRAMUSCULAR | Status: AC
  Administered 2016-05-30: 1 mg via INTRAVENOUS

## 2016-05-30 MED ORDER — ACETAMINOPHEN 650 MG RE SUPP: 650.0000 mg | Freq: Four times a day (QID) | RECTAL | Status: DC | PRN

## 2016-05-30 MED ORDER — HEPARIN SODIUM (PORCINE) 5000 UNIT/ML IJ SOLN
5000.0000 [IU] | Freq: Three times a day (TID) | INTRAMUSCULAR | Status: DC
  Administered 2016-05-30 – 2016-05-31 (×3): 5000 [IU] via SUBCUTANEOUS
  Filled 2016-05-30 (×3): qty 1

## 2016-05-30 MED ORDER — PRIMAQUINE PHOSPHATE 26.3 MG PO TABS
30.0000 mg | ORAL_TABLET | Freq: Every day | ORAL | Status: DC
  Administered 2016-05-30 – 2016-06-01 (×3): 30 mg via ORAL
  Filled 2016-05-30 (×5): qty 2

## 2016-05-30 MED ORDER — HEPARIN SODIUM (PORCINE) 1000 UNIT/ML DIALYSIS
40.0000 [IU]/kg | INTRAMUSCULAR | Status: DC | PRN
  Filled 2016-05-30: qty 3

## 2016-05-30 MED ORDER — DEXTROSE 5 % IV SOLN
1.0000 g | Freq: Once | INTRAVENOUS | Status: AC
  Administered 2016-05-30: 1 g via INTRAVENOUS
  Filled 2016-05-30: qty 1

## 2016-05-30 MED ORDER — TOPIRAMATE 100 MG PO TABS
50.0000 mg | ORAL_TABLET | Freq: Two times a day (BID) | ORAL | Status: DC
  Administered 2016-05-30 – 2016-06-02 (×6): 50 mg via ORAL
  Filled 2016-05-30 (×6): qty 1

## 2016-05-30 MED ORDER — DARUNAVIR-COBICISTAT 800-150 MG PO TABS
1.0000 | ORAL_TABLET | Freq: Every evening | ORAL | Status: DC
  Filled 2016-05-30: qty 1

## 2016-05-30 MED ORDER — LOSARTAN POTASSIUM 50 MG PO TABS
100.0000 mg | ORAL_TABLET | Freq: Every day | ORAL | Status: DC
Start: 2016-05-30 — End: 2016-06-02
  Administered 2016-05-30 – 2016-06-01 (×3): 100 mg via ORAL
  Filled 2016-05-30 (×3): qty 2

## 2016-05-30 MED ORDER — HYDRALAZINE HCL 50 MG PO TABS
75.0000 mg | ORAL_TABLET | Freq: Three times a day (TID) | ORAL | Status: DC
  Administered 2016-05-30 – 2016-06-02 (×8): 75 mg via ORAL
  Filled 2016-05-30 (×8): qty 1

## 2016-05-30 MED ORDER — PREDNISONE 20 MG PO TABS: 60.0000 mg | ORAL_TABLET | Freq: Two times a day (BID) | ORAL | Status: DC

## 2016-05-30 MED ORDER — PENTAFLUOROPROP-TETRAFLUOROETH EX AERO
1.0000 "application " | INHALATION_SPRAY | CUTANEOUS | Status: DC | PRN
  Filled 2016-05-30: qty 30

## 2016-05-30 MED ORDER — CLINDAMYCIN PHOSPHATE 900 MG/50ML IV SOLN
900.0000 mg | Freq: Three times a day (TID) | INTRAVENOUS | Status: DC
  Administered 2016-05-30 – 2016-05-31 (×4): 900 mg via INTRAVENOUS
  Filled 2016-05-30 (×8): qty 50

## 2016-05-30 MED ORDER — PREDNISONE 20 MG PO TABS: 20.0000 mg | ORAL_TABLET | Freq: Every day | ORAL | Status: DC

## 2016-05-30 MED ORDER — SODIUM CHLORIDE 0.9 % IV SOLN: 100.0000 mL | INTRAVENOUS | Status: DC | PRN

## 2016-05-30 MED ORDER — DOLUTEGRAVIR SODIUM 50 MG PO TABS
50.0000 mg | ORAL_TABLET | Freq: Every evening | ORAL | Status: DC
  Filled 2016-05-30: qty 1

## 2016-05-30 MED ORDER — PREDNISONE 20 MG PO TABS
40.0000 mg | ORAL_TABLET | Freq: Two times a day (BID) | ORAL | Status: DC
  Administered 2016-05-30 – 2016-06-02 (×6): 40 mg via ORAL
  Filled 2016-05-30 (×6): qty 2

## 2016-05-30 NOTE — Progress Notes (Signed)
MD and RN aware of NIV refusal

## 2016-05-30 NOTE — Procedures (Signed)
  I was present at this dialysis session, have reviewed the session itself and made  appropriate changes Kelly Splinter MD [place] pager [phone]   05/30/2016, 1:23 PM

## 2016-05-30 NOTE — ED Notes (Signed)
Pt refusing Vancomycin, pt sts he is allergic and does not want to receive the medication, Dr Betsey Holiday aware.

## 2016-05-30 NOTE — Consult Note (Signed)
[name] Renal Consultation Note    Indication for Consultation:  Management of ESRD/hemodialysis; anemia, hypertension/volume and secondary hyperparathyroidism Referring physician IM Teaching Service  HPI: Jim Wyatt is a 36 y.o. 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  Since his last discharge - he presented with a pre dialysis weight of 71.6 (EDW 64.5) on Tuesday, pre weight 73.7 Thursday and pre weight 76.4 Saturday.  He has done better staying on treatments but has not been getting enough volume off. His pre HD BP was 185/114 sitting on Saturday with post HD BP 174/104 sitting and 189/117 P 106 standing and net UF 4.5  His last  post HD weight was 71.9 3/31.  High IDWG and limitations on fluid removal due to patient intolerance during HD treatments continue to result in massive weight gains and high BP.  OF note BP were normal with dramatically better weights during his last hospitalized day 3/25 on hydralazine 75 tid, losartan MTP succinate 100 and nifedipine 60 with volume near his EDW - last recorded wt in computer 65 kg.Jim Wyatt  He presented to the ED today with severe SOB and ability to only speak in 1-2 word sentences.  Work up showed pulmonary edema in CXR and CT angio - no PE.  He is febrile with temp to 101.8 and BP is elevated WBC 6.2 He denies any diarrhea at present but has had. but has N, V, cough and severe HA.   Past Medical History:  Diagnosis Date  . Asthma   . CAP (community acquired pneumonia) 10/2013  . Cellulitis 11/2013   LLE  . DVT (deep venous thrombosis) ([place]) 10/2013   BLE  . 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 Went on HD Dec 2016.   Jim Wyatt Fever   . Headache   . HIV infection ([place]) dx'd 10/2013  . Hypertension   . Leg pain   . Multiple  environmental allergies    "trees, dogs, cats"  . Multiple food allergies   . Shingles < 2010  . 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 AV FISTULA PLACEMENT Left 03/03/2015   Procedure: Creation of Left Arm RADIOCEPHALIC ARTERIOVENOUS FISTULA ;  Surgeon: Mal Misty, MD;  Location: [facility];  Service: Vascular;  Laterality: Left;  . COLONOSCOPY N/A 03/18/2016   Procedure: COLONOSCOPY;  Surgeon: Manus Gunning, MD;  Location: [facility];  Service: Gastroenterology;  Laterality: N/A;  . FEMUR FRACTURE SURGERY Right 09/2001  . FRACTURE SURGERY    . I&D EXTREMITY Right 12/18/2014   Procedure: IRRIGATION AND DEBRIDEMENT RIGHT LOWER EXTREMITY;  Surgeon: Ralene Ok, MD;  Location: [facility];  Service: General;  Laterality: Right;  . INSERTION OF DIALYSIS CATHETER  2016  . LAPAROTOMY N/A 12/28/2015   Procedure: EXPLORATORY LAPAROTOMY BOWEL RESECTION ILEOCECECTOMY;  Surgeon: Clovis Riley, MD;  Location: [facility];  Service: General;  Laterality: N/A;   Family History  Problem Relation Age of Onset  . Hypertension Mother   . Diabetes Maternal Grandmother   . Kidney disease Maternal Grandmother    Social History:  reports that he has never smoked. He has never used smokeless tobacco. He reports that he does not drink alcohol or use drugs. Allergies  Allergen Reactions  . Bactrim [Sulfamethoxazole-Trimethoprim] Other (  See Comments)    Renal failure  . Reglan [Metoclopramide] Other (See Comments)    Causes ticks/ jerks  . Vancomycin Other (See Comments)    Patient states vancomycin caused kidney injury  . Amlodipine Palpitations and Other (See Comments)    fatigue  . Augmentin [Amoxicillin-Pot Clavulanate] Hives   Prior to Admission medications   Medication Sig Start Date End Date Taking? Authorizing Provider  abacavir (ZIAGEN) 300 MG tablet Take 2 tablets (600 mg total) by mouth daily with supper. 12/14/15  Yes Carlyle Basques, MD  acetaminophen (TYLENOL) 500 MG tablet Take 1,000 mg by mouth every 6 (six) hours as needed (pain).   Yes Historical Provider, MD  albuterol (PROVENTIL HFA;VENTOLIN HFA) 108 (90 Base) MCG/ACT inhaler Inhale 2 puffs into the lungs every 6 (six) hours as needed for wheezing or shortness of breath. 04/19/16  Yes [name], MD  Ascorbic Acid (VITAMIN C) 250 MG CHEW Chew 500 mg by mouth daily.    Yes Historical Provider, MD  azithromycin (ZITHROMAX) 600 MG tablet Take 1,200 mg by mouth once a week.    Yes Historical Provider, MD  dapsone 100 MG tablet Take 1 tablet (100 mg total) by mouth daily. 11/24/15  Yes Alexa Angela Burke, MD  darunavir-cobicistat (PREZCOBIX) 800-150 MG tablet Take 1 tablet by mouth every evening. Swallow whole. Do NOT crush, break or chew tablets. Take with food. 12/14/15  Yes Carlyle Basques, MD  dolutegravir (TIVICAY) 50 MG tablet Take 1 tablet (50 mg total) by mouth every evening. 12/14/15  Yes Carlyle Basques, MD  hydrALAZINE (APRESOLINE) 25 MG tablet Take 3 tablets (75 mg total) by mouth 3 (three) times daily. 05/21/16  Yes Eloise Levels, MD  levETIRAcetam (KEPPRA) 250 MG tablet Take 1 tablet (250 mg) by mouth on Tuesday, Thursday, Saturday at 7pm Patient taking differently: Take 250-500 mg by mouth See admin instructions. takes extra 250mg  on dialysis days Wallie Char, Sunday 03/13/16  Yes Cameron Sprang, MD  levETIRAcetam (KEPPRA) 500 MG tablet Take 1 tablet (500 mg total) by mouth daily. 03/21/16  Yes Burgess Estelle, MD  losartan (COZAAR) 100 MG tablet Take 100 mg by mouth at bedtime.   Yes Historical Provider, MD  metoprolol succinate (TOPROL-XL) 100 MG 24 hr tablet Take 1 tablet (100 mg total) by mouth daily. Take with or immediately following a meal. 05/22/16  Yes Eloise Levels, MD  topiramate (TOPAMAX) 50 MG tablet Take 1 tablet (50 mg total) by mouth 2 (two) times daily. 05/21/16  Yes Eloise Levels, MD  NIFEdipine (PROCARDIA-XL/ADALAT CC) 60 MG 24 hr tablet  Take 1 tablet (60 mg total) by mouth daily. 05/22/16   Eloise Levels, MD  venlafaxine XR (EFFEXOR-XR) 150 MG 24 hr capsule TAKE 1 CAPSULE(150 MG) BY MOUTH DAILY Patient not taking: Reported on 05/12/2016 04/19/16   Holley Raring, MD   Current Facility-Administered Medications  Medication Dose Route Frequency Provider Last Rate Last Dose  . 0.9 %  sodium chloride infusion  100 mL Intravenous PRN Elmarie Shiley, MD      . 0.9 %  sodium chloride infusion  100 mL Intravenous PRN Elmarie Shiley, MD      . acetaminophen (TYLENOL) tablet 650 mg  650 mg Oral Q6H PRN Carlyle Dolly, MD   650 mg at 05/30/16 0606   Or  . acetaminophen (TYLENOL) suppository 650 mg  650 mg Rectal Q6H PRN Carlyle Dolly, MD      . cefTAZidime (FORTAZ) 1 g  in dextrose 5 % 50 mL IVPB  1 g Intravenous QHS Carlyle Dolly, MD      . Darbepoetin Alfa (ARANESP) injection 200 mcg  200 mcg Intravenous Q Mon-HD Alric Seton, PA-C      . heparin injection 1,000 Units  1,000 Units Dialysis PRN Elmarie Shiley, MD      . heparin injection 2,700 Units  40 Units/kg Dialysis PRN Elmarie Shiley, MD      . HYDROmorphone (DILAUDID) 1 MG/ML injection           . iopamidol (ISOVUE-370) 76 % injection           . [START ON 06/09/2016] predniSONE (DELTASONE) tablet 20 mg  20 mg Oral Q breakfast [place] Bing, DO      . [START ON 06/04/2016] predniSONE (DELTASONE) tablet 40 mg  40 mg Oral Q breakfast [place] Bing, DO      . predniSONE (DELTASONE) tablet 60 mg  60 mg Oral BID WC [place] Bing, DO      . vancomycin (VANCOCIN) 1,250 mg in sodium chloride 0.9 % 250 mL IVPB  1,250 mg Intravenous Once [name], MD       Current Outpatient Prescriptions  Medication Sig Dispense Refill  . abacavir (ZIAGEN) 300 MG tablet Take 2 tablets (600 mg total) by mouth daily with supper. 60 tablet 11  . acetaminophen (TYLENOL) 500 MG tablet Take 1,000 mg by mouth every 6 (six) hours as needed (pain).    Jim Wyatt albuterol (PROVENTIL HFA;VENTOLIN HFA) 108  (90 Base) MCG/ACT inhaler Inhale 2 puffs into the lungs every 6 (six) hours as needed for wheezing or shortness of breath. 3 Inhaler 3  . Ascorbic Acid (VITAMIN C) 250 MG CHEW Chew 500 mg by mouth daily.     Jim Wyatt azithromycin (ZITHROMAX) 600 MG tablet Take 1,200 mg by mouth once a week.     . dapsone 100 MG tablet Take 1 tablet (100 mg total) by mouth daily. 30 tablet 11  . darunavir-cobicistat (PREZCOBIX) 800-150 MG tablet Take 1 tablet by mouth every evening. Swallow whole. Do NOT crush, break or chew tablets. Take with food. 30 tablet 11  . dolutegravir (TIVICAY) 50 MG tablet Take 1 tablet (50 mg total) by mouth every evening. 30 tablet 11  . hydrALAZINE (APRESOLINE) 25 MG tablet Take 3 tablets (75 mg total) by mouth 3 (three) times daily. 90 tablet 0  . levETIRAcetam (KEPPRA) 250 MG tablet Take 1 tablet (250 mg) by mouth on Tuesday, Thursday, Saturday at 7pm (Patient taking differently: Take 250-500 mg by mouth See admin instructions. takes extra 250mg  on dialysis days Tu, Thurs, Sunday) 60 tablet 11  . levETIRAcetam (KEPPRA) 500 MG tablet Take 1 tablet (500 mg total) by mouth daily. 60 tablet 11  . losartan (COZAAR) 100 MG tablet Take 100 mg by mouth at bedtime.    . metoprolol succinate (TOPROL-XL) 100 MG 24 hr tablet Take 1 tablet (100 mg total) by mouth daily. Take with or immediately following a meal. 30 tablet 0  . topiramate (TOPAMAX) 50 MG tablet Take 1 tablet (50 mg total) by mouth 2 (two) times daily. 30 tablet 0  . NIFEdipine (PROCARDIA-XL/ADALAT CC) 60 MG 24 hr tablet Take 1 tablet (60 mg total) by mouth daily. 30 tablet 0  . venlafaxine XR (EFFEXOR-XR) 150 MG 24 hr capsule TAKE 1 CAPSULE(150 MG) BY MOUTH DAILY (Patient not taking: Reported on 05/12/2016) 30 capsule 0   Labs: Basic Metabolic Panel:  Recent  Labs Lab 05/30/16 0030  NA 139  K 6.2*  CL 98*  CO2 25  GLUCOSE 81  BUN 64*  CREATININE 11.75*  CALCIUM 9.5   Liver Function Tests:  Recent Labs Lab 05/30/16 0030   AST 22  ALT 12*  ALKPHOS 47  BILITOT 1.0  PROT 7.4  ALBUMIN 3.2*    Recent Labs Lab 05/30/16 0732  LIPASE 36     Recent Labs Lab 05/30/16 0030  WBC 6.2  NEUTROABS 4.7  HGB 8.9*  HCT 27.5*  MCV 96.2  PLT 167   Studies/Results: Ct Angio Chest Pe W Or Wo Contrast  Result Date: 05/30/2016 CLINICAL DATA:  Dyspnea for 1 day. EXAM: CT ANGIOGRAPHY CHEST WITH CONTRAST TECHNIQUE: Multidetector CT imaging of the chest was performed using the standard protocol during bolus administration of intravenous contrast. Multiplanar CT image reconstructions and MIPs were obtained to evaluate the vascular anatomy. CONTRAST:  80 mL Isovue 370 intravenous COMPARISON:  05/12/2016 FINDINGS: Cardiovascular: No large or central pulmonary emboli. Reduced sensitivity for detection of small peripheral emboli. Small pericardial effusion, reduced from 05/12/2016. Mediastinum/Nodes: No enlarged mediastinal, hilar, or axillary lymph nodes. Thyroid gland, trachea, and esophagus demonstrate no significant findings. Lungs/Pleura: Diffuse central airspace opacities and interlobular septal thickening, likely interstitial and alveolar edema. Infectious infiltrates less likely but not excluded. Airways are patent and normal in caliber. Small pleural effusions bilaterally. Upper Abdomen: No acute abnormality. Musculoskeletal: No chest wall abnormality. No acute or significant osseous findings. Review of the MIP images confirms the above findings. IMPRESSION: 1. No large or central pulmonary emboli 2. Diffuse interstitial and alveolar opacity. This may represent congestive failure or fluid overload. Infectious infiltrates are less likely. 3. Small pleural effusions. Electronically Signed   By: Andreas Newport M.D.   On: 05/30/2016 03:41   Dg Chest Portable 1 View  Result Date: 05/30/2016 CLINICAL DATA:  Dyspnea, worsening today. EXAM: PORTABLE CHEST 1 VIEW COMPARISON:  05/21/2016 FINDINGS: Stable cardiomegaly. Mild hazy  central and basilar opacities, likely alveolar edema. Moderate interstitial fluid or thickening. No large pleural effusions. IMPRESSION: Stable cardiomegaly.  Interstitial and alveolar edema. Electronically Signed   By: Andreas Newport M.D.   On: 05/30/2016 00:27    ROS: As per HPI otherwise negative.  Physical Exam: Vitals:   05/30/16 0830 05/30/16 0900 05/30/16 0930 05/30/16 1000  BP: (!) 172/106 (!) 179/111 (!) 186/116 (!) (P) 186/115  Pulse: (!) 113 (!) 115 (!) 115 (!) (P) 113  Resp: (!) 24 (!) 23 (!) 26 (!) (P) 26  Temp:      TempSrc:      SpO2:      Weight:      Height:         General: acutely ill AAM curled up on stretcher hot to touch Head: NCAT MMM periorbital and facial edema Neck: Supple.  Lungs: bilateral rhonchi Heart: tachy rate ~115- 120 reg Abdomen: slightly distendedNT + BS Lower extremities: tr LE edema or ischemic changes, no open wounds  Neuro: A & O  X 3. Moves all extremities spontaneously. Psych:  Responds to questions appropriately with a normal affect. Dialysis Access: left lower AVF + bruit  Dialysis Orders:  [facility] TTS 3.75 hr 450/800 EDW 64.5 no heparin (lowered 1 kg from last admission) 2K 2 Ca no profile no var Na left lower AVF Hectorol 6 venofer 100 - through 4/11 Mircera 225 - last dose 3/10  Recent labs: hgb 9.8 28% sat ferritin 500s Corr Ca 10.4 iPTH 250 P  6  Assessment/Plan: 1. Acute respiratory failure with SIRS - volume is highly contributory as per CT and HPI- plus  underlying infectious component ? Pulmonary + cough/AIDs; cultures and abtx per primary 2. Uncontrolled HTN - due to volume and I suspect non adherence to meds - had net UF 3.5 L today - without improvement in BP - 3. ESRD -  TTS - HD today- serial HD to lower volume- next HD in am 4. Anemia  - resume ESA -hold on Fe for now due to infectious w/u tsat ok 5. Metabolic bone disease -  Hold Hectorol due to Jim Wyatt Corr Ca/ cont binders 6. Nutrition - renal diet 7. HIV/AIDs - CD4 3/16 was  1- per primary 8. Hx DVT - net for PE CT angio 9. Abdominal swelling/diarrhea- trend diarrhea -  10. Sz d/o - 11. Ashtma  12. Headaches - likely associated with HTN/fever/excess volume 13. Medical noncompliance-contributory to problems   Myriam Jacobson, PA-C [facility] [phone] 05/30/2016, 12:02 PM   Pt seen, examined and agree w A/P as above. ESRD pt having recurrent fevers, hx HIV with very low CD4.  Also having persistent problems with chronic volume overload.  Took a week last admission to get him down to his dry wt and he looked much better overall at his dry wt.   Kelly Splinter MD [employer] pager [phone]   05/30/2016, 1:21 PM

## 2016-05-30 NOTE — ED Notes (Signed)
Bedside commode set up for pt. to use ,  Pt. refused to use  bedside commode and prefers using toilet , assisted to use bathroom wit o2 4 lpm/[place] .

## 2016-05-30 NOTE — Discharge Summary (Signed)
[facility] Discharge Summary  Patient name: Jim Wyatt Medical record number: [id number] Date of birth: 04-23-1980 Age: 36 y.o. Gender: male Date of Admission: 05/29/2016  Date of Discharge: 06/02/2016 Admitting Physician: [place] Bing, DO  Primary Care Provider: [name], MD Consultants: Nephrology, Infectious Disease  Indication for Hospitalization: Acute respiratory failure  Discharge Diagnoses/Problem List:  Hypoxic acute respiratory failure meeting sepsis Suspected PCP pneumonia Renovascular hypertension Tachycardia End-stage renal disease AIDS Abdominal swelling Diarrhea History of DVT Seizure disorder Possible OSA Asthma  Disposition: Home  Discharge Condition: Stable, improved  Discharge Exam:  General: pleasent sitting upright eating breakfast at bedside, well developed, non-toxic appearance HEENT: normocephalic, atraumatic, moist mucous membranes CV: regular rate and rhythm without murmurs, rubs, or gallops Lungs: bibasilar crackles, no wheezing, normal work of breathing on room air Abdomen: soft, non-tender, normoactive bowel sounds Skin: warm, dry, no rashes or lesions, cap refill < 2 seconds Extremities: warm and well perfused, normal tone  Brief Hospital Course:  Jim Canning [place] a 36 y.o.malepresenting with dyspnea. PMH is significant for poorly controlled hypertension, headache, HIV with AIDS, end-stage renal disease on dialysis (T/TH/S) and history of DVT x 1 in 2015 (not on any anti-coagulation).  Patient experienced progressive SOB on 4/2 with associated pleuritic CP prompting him to call EMS. On presentation, VS febrile (101.73F) with tachycardia and tachypnea with severe range BP. Patient required 4 L [place] in ED (refused BiPAP). ABG showed slight alkalosis pH of 7.487 with normal bicarbonate and CO2. Without lactic acidosis. EKG sinus tachycardia. CXR concerning for moderate interstitial and alveolar  edema. CTA negative for PE. Patient initially treated for a HAP purse fluid overload secondary to ESRD. Patient received HD with improvement of SOB. Infectious disease consulted concerning AIDS status and possible opportunistic infection. Patient was started on treatment for PCP pneumonia with clindamycin and primaquine along with steroid taper. Patient's respiratory condition significantly improved over the course of 24 hours after onset of treatment. Treatment was continued with transition of HIV medications prior to discharge. New medications were sent to patient's pharmacy. Patient was stable and cleared for discharge with infectious disease and PCP follow-up.  Issues for Follow Up:  1. Patient is to complete a steroid taper for Pneumocystis Pneumonia. Prednisone taper provided upon discharge with instructions to complete a single dose of 40 mg the evening of discharge, followed by 40 mg twice a day for 1 day, followed by 40 mg daily for 5 days, followed by 20 mg daily or 11 days until completion. 2. Antibiotics are to be completed for Pneumocystis Pneumonia. Patient to complete 18 more days of primaquine 30 mg daily and clindamycin X 100 mg every 8 hours. Patient is to hold azithromycin and dapsone until antibiotics are completed. 3. HIV medications were discussed with infectious disease during admission along with pharmacy. Patient was to discontinue current HIV medications and start Tivicay and Descovy which were sent to pharmacy prior to discharge. 4. Patient instructed to follow-up with infectious disease and PCP. 5. Blood pressures remained hypertensive throughout admission, however better control prior to discharge. Patient instructed to restart losartan and hydralazine. Procardia and metoprolol were discontinued and placed on patient's allergy list given side effects and refusal to continue medications.  Significant Procedures: HD daily 4/3-6  Significant Labs and Imaging:   Recent Labs Lab  05/31/16 0718 06/01/16 0532 06/02/16 0513  WBC 6.2 4.5 4.1  HGB 8.5* 8.9* 9.1*  HCT 27.1* 28.2* 27.7*  PLT 144* 140* 154  Recent Labs Lab 05/30/16 0030 05/30/16 1442 05/31/16 0718 06/01/16 0532 06/02/16 0513  NA 139 134* 132* 132* 137  K 6.2* 5.8* 6.1* 4.8 4.1  CL 98* 97* 95* 96* 98*  CO2 25 27 24 24 26   GLUCOSE 81 81 99 117* 104*  BUN 64* 29* 51* 47* 44*  CREATININE 11.75* 6.96* 9.16* 7.57* 6.69*  CALCIUM 9.5 8.9 8.8* 8.3* 8.4*  PHOS  --  4.9* 7.9* 7.6* 5.6*  ALKPHOS 47  --   --   --   --   AST 22  --   --   --   --   ALT 12*  --   --   --   --   ALBUMIN 3.2* 3.0* 2.7* 2.8* 2.7*   Urine culture: NGTD Blood culture: NGTD Lactic acid: 1.39 > 1.22 Lactic dehydrogenase: 414 (H) Lipase: 36 (WNL) HIV RNA Quant: Pending Integrase HIV: Pending CD4/CD8: CD4 5% Influenza panel: Negative I-STAT arterial blood gas: pH 7.487: PCO2 36.1: PCO2 85.0: Bicarbonate 27.0: O2 sat 96%  Imaging/Diagnostic Tests: CT ANGIO CHEST PE W OR WO CONTRAST (05/30/2016) FINDINGS: Cardiovascular: No large or central pulmonary emboli. Reduced sensitivity for detection of small peripheral emboli. Small pericardial effusion, reduced from 05/12/2016. Mediastinum/Nodes: No enlarged mediastinal, hilar, or axillary lymph nodes. Thyroid gland, trachea, and esophagus demonstrate no significant findings. Lungs/Pleura: Diffuse central airspace opacities and interlobular septal thickening, likely interstitial and alveolar edema. Infectious infiltrates less likely but not excluded. Airways are patent and normal in caliber. Small pleural effusions bilaterally. Upper Abdomen: No acute abnormality. Musculoskeletal: No chest wall abnormality. No acute or significant osseous findings. Review of the MIP images confirms the above findings.  IMPRESSION: 1. No large or central pulmonary emboli 2. Diffuse interstitial and alveolar opacity. This may represent congestive failure or fluid overload. Infectious  infiltrates are less likely. 3. Small pleural effusions.  DG Chest Portable 1 View (05/30/2016) FINDINGS: Stable cardiomegaly. Mild hazy central and basilar opacities, likely alveolar edema. Moderate interstitial fluid or thickening. No large pleural effusions.  IMPRESSION: Stable cardiomegaly. Interstitial and alveolar edema.  Results/Tests Pending at Time of Discharge: HIV-1 RNA ultra Quant reflux to genotype, GenoSure Integrase HIV  Discharge Medications:  Allergies as of 06/02/2016      Reactions   Bactrim [sulfamethoxazole-trimethoprim] Other (See Comments)   Renal failure   Reglan [metoclopramide] Other (See Comments)   Causes ticks/ jerks   Vancomycin Other (See Comments)   Patient states vancomycin caused kidney injury   Metoprolol Hives   Pt reports hives   Amlodipine Palpitations, Other (See Comments)   fatigue   Augmentin [amoxicillin-pot Clavulanate] Hives   Procardia [nifedipine] Hives   Pt reports hives, itching, SOB      Medication List    STOP taking these medications   azithromycin 600 MG tablet Commonly known as:  ZITHROMAX   dapsone 100 MG tablet   metoprolol succinate 100 MG 24 hr tablet Commonly known as:  TOPROL-XL   NIFEdipine 60 MG 24 hr tablet Commonly known as:  PROCARDIA-XL/ADALAT CC     TAKE these medications   acetaminophen 500 MG tablet Commonly known as:  TYLENOL Take 1,000 mg by mouth every 6 (six) hours as needed (pain).   albuterol 108 (90 Base) MCG/ACT inhaler Commonly known as:  PROVENTIL HFA;VENTOLIN HFA Inhale 2 puffs into the lungs every 6 (six) hours as needed for wheezing or shortness of breath.   clindamycin 300 MG capsule Commonly known as:  CLEOCIN Take 2 capsules (600 mg total)  by mouth every 8 (eight) hours.   dolutegravir 50 MG tablet Commonly known as:  TIVICAY Take 1 tablet (50 mg total) by mouth daily.   emtricitabine-tenofovir AF 200-25 MG tablet Commonly known as:  DESCOVY Take 1 tablet by mouth  daily.   hydrALAZINE 25 MG tablet Commonly known as:  APRESOLINE Take 3 tablets (75 mg total) by mouth 3 (three) times daily.   levETIRAcetam 250 MG tablet Commonly known as:  KEPPRA Take 1 tablet (250 mg) by mouth on Tuesday, Thursday, Saturday at 7pm What changed:  how much to take  how to take this  when to take this  additional instructions   levETIRAcetam 500 MG tablet Commonly known as:  KEPPRA Take 1 tablet (500 mg total) by mouth daily. What changed:  Another medication with the same name was changed. Make sure you understand how and when to take each.   losartan 100 MG tablet Commonly known as:  COZAAR Take 100 mg by mouth at bedtime.   predniSONE 20 MG tablet Commonly known as:  DELTASONE Take 2 tab before bedtime on 4/6, then 2 tabs twice daily for 1 day, then 2 tabs daily for 5 days, then 1 tab daily for 11 days   primaquine 26.3 MG tablet Take 2 tablets (30 mg total) by mouth daily. Start taking on:  06/03/2016   topiramate 50 MG tablet Commonly known as:  TOPAMAX Take 1 tablet (50 mg total) by mouth 2 (two) times daily.   venlafaxine XR 150 MG 24 hr capsule Commonly known as:  EFFEXOR-XR TAKE 1 CAPSULE(150 MG) BY MOUTH DAILY   Vitamin C 250 MG Chew Chew 500 mg by mouth daily.       Discharge Instructions: Please refer to Patient Instructions section of EMR for full details.  Patient was counseled important signs and symptoms that should prompt return to medical care, changes in medications, dietary instructions, activity restrictions, and follow up appointments.   Follow-Up Appointments: Follow-up Information    [name], MD. Go on 06/02/2016.   Specialty:  Internal Medicine Why:  Use call and schedule a hospital follow-up within 1 week. Contact information: [place] [address] [phone]        Cornelius Van Dam, MD. Schedule an appointment as soon as possible for a visit on 06/02/2016.   Specialty:  Infectious  Diseases Why:  Please follow-up with scheduled appointment after discharge. Office should call and schedule for you. Contact information: [address] [address] [phone]           David J McMullen, DO 06/02/2016, 1:40 PM PGY-1, [place]

## 2016-05-30 NOTE — ED Provider Notes (Signed)
[facility] DEPT Provider Note   CSN: [id number] Arrival date & time: 05/29/16  2347    By signing my name below, I, Jim Wyatt, attest that this documentation has been prepared under the direction and in the presence of Jim Greek, MD. Electronically Signed: Macon Wyatt, ED Scribe. 05/30/16. 12:17 AM.  History   Chief Complaint Chief Complaint  Patient presents with  . Shortness of Breath   The history is provided by the patient and the EMS personnel. No language interpreter was used.   HPI Comments: Jim Wyatt is a 36 y.o. male with PMHx of HTN, HIV, ESRD on HD, DVT brought in by ambulance who presents to the Emergency Department complaining of gradually worsening, constant, SOB that occurred yesterday at ~4pm. Per EMS, pt was administered albuterol with no relief. Pt notes he had a full dialysis session at [facility] three days ago without complications. Pt describes his SOB "like I have fluids in my lungs." He reports associated persistent dry coughing, subjective fever and episodic diarrhea. Pt's temperature in the ED today was 100.3. He notes having ~7 episodes of diarrhea throughout the day yesterday. No alleviating factors noted for diarrhea and fever. Pt denies congestion and pain. He also denies recent sick contact.   Past Medical History:  Diagnosis Date  . Asthma   . CAP (community acquired pneumonia) 10/2013  . Cellulitis 11/2013   LLE  . DVT (deep venous thrombosis) ([place]) 10/2013   BLE  . 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 Went on HD Dec 2016.   [name] Fever   . Headache   . HIV infection ([place]) dx'd 10/2013  . Hypertension   . Leg pain   . Multiple environmental allergies    "trees, dogs, cats"  . Multiple food allergies   . Shingles < 2010  . 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 SIRS (systemic inflammatory response  syndrome) ([place]) 05/30/2016  . Chest pressure   . Palliative care encounter   . Goals of care, counseling/discussion   . Shortness of breath 05/12/2016  . Dyspnea   . Hypertensive emergency   . Pericardial effusion   . Tachycardia   . HIV disease ([place])   . OSA (obstructive sleep apnea) 04/19/2016  . Hospital discharge follow-up 04/19/2016  . Depression 03/24/2016  . Breast lump or mass   . Rectal bleeding   . GI bleed 03/15/2016  . Hyperkalemia   . New onset seizure ([place]) 03/13/2016  . Symptomatic anemia 02/01/2016  . Dysuria 01/18/2016  . Hematochezia 01/09/2016  . Postoperative anemia due to acute blood loss 01/09/2016  . Seizure disorder ([place]) 12/29/2015  . Intussusception ([place]) 12/28/2015  . Hypertension 12/28/2015  . Otitis externa of both ears 11/21/2015  . AIDS (acquired immunodeficiency syndrome), CD4 <=200 ([place])   . Meningitis   . Cephalalgia   . Blurry vision, bilateral   . Headache   . Hypertensive urgency   . New onset of headache in immunocompromised patient ([place]) 07/06/2015  . ESRD on dialysis ([place]) 04/27/2015  . CAP (community acquired pneumonia) 02/25/2015  . ESRD (end stage renal disease) ([place]) 02/16/2015  . History of DVT (deep vein thrombosis) 01/12/2015  . Candidal esophagitis ([place])   . Constipation   . Nausea without vomiting 12/20/2014  . Abscess of right thigh 12/19/2014  . Chronic kidney disease (CKD), stage IV (severe) ([place]) 12/19/2014  .  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 Anemia due to end stage renal disease ([place]) 12/19/2014  . Hyponatremia 12/19/2014  . Community acquired pneumonia 07/30/2014  . Anemia of chronic disease 07/30/2014  . Acute on chronic renal failure ([place])   . 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 CMV (cytomegalovirus infection) ([place]) 12/07/2013  . Protein-calorie malnutrition, severe ([place]) 12/06/2013  . 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 DVT (deep venous  thrombosis) ([place]) 11/24/2013  . Asthma, chronic 11/24/2013  . Acquired immune deficiency syndrome ([place]) 11/14/2013  . AKI (acute kidney injury) ([place]) 11/11/2013  . Elevated liver enzymes 11/09/2013  . History of shingles 11/09/2013  . Obesity 11/09/2013  . Abdominal pain 11/07/2013  . Asthma     Past Surgical History:  Procedure Laterality Date  . AV FISTULA PLACEMENT Left 03/03/2015   Procedure: Creation of Left Arm RADIOCEPHALIC ARTERIOVENOUS FISTULA ;  Surgeon: Mal Misty, MD;  Location: [facility];  Service: Vascular;  Laterality: Left;  . COLONOSCOPY N/A 03/18/2016   Procedure: COLONOSCOPY;  Surgeon: Manus Gunning, MD;  Location: [facility];  Service: Gastroenterology;  Laterality: N/A;  . FEMUR FRACTURE SURGERY Right 09/2001  . FRACTURE SURGERY    . I&D EXTREMITY Right 12/18/2014   Procedure: IRRIGATION AND DEBRIDEMENT RIGHT LOWER EXTREMITY;  Surgeon: Ralene Ok, MD;  Location: [facility];  Service: General;  Laterality: Right;  . INSERTION OF DIALYSIS CATHETER  2016  . LAPAROTOMY N/A 12/28/2015   Procedure: EXPLORATORY LAPAROTOMY BOWEL RESECTION ILEOCECECTOMY;  Surgeon: Clovis Riley, MD;  Location: [facility];  Service: General;  Laterality: N/A;       Home Medications    Prior to Admission medications   Medication Sig Start Date End Date Taking? Authorizing Provider  abacavir (ZIAGEN) 300 MG tablet Take 2 tablets (600 mg total) by mouth daily with supper. 12/14/15  Yes Carlyle Basques, MD  acetaminophen (TYLENOL) 500 MG tablet Take 1,000 mg by mouth every 6 (six) hours as needed (pain).   Yes Historical Provider, MD  albuterol (PROVENTIL HFA;VENTOLIN HFA) 108 (90 Base) MCG/ACT inhaler Inhale 2 puffs into the lungs every 6 (six) hours as needed for wheezing or shortness of breath. 04/19/16  Yes [name], MD  Ascorbic Acid (VITAMIN C) 250 MG CHEW Chew 500 mg by mouth daily.    Yes Historical Provider, MD  azithromycin (ZITHROMAX) 600 MG tablet Take 1,200 mg by  mouth once a week.    Yes Historical Provider, MD  dapsone 100 MG tablet Take 1 tablet (100 mg total) by mouth daily. 11/24/15  Yes Jim Angela Burke, MD  darunavir-cobicistat (PREZCOBIX) 800-150 MG tablet Take 1 tablet by mouth every evening. Swallow whole. Do NOT crush, break or chew tablets. Take with food. 12/14/15  Yes Carlyle Basques, MD  dolutegravir (TIVICAY) 50 MG tablet Take 1 tablet (50 mg total) by mouth every evening. 12/14/15  Yes Carlyle Basques, MD  hydrALAZINE (APRESOLINE) 25 MG tablet Take 3 tablets (75 mg total) by mouth 3 (three) times daily. 05/21/16  Yes Eloise Levels, MD  levETIRAcetam (KEPPRA) 250 MG tablet Take 1 tablet (250 mg) by mouth on Tuesday, Thursday, Saturday at 7pm Patient taking differently: Take 250-500 mg by mouth See admin instructions. takes extra 250mg  on dialysis days Wallie Char, Sunday 03/13/16  Yes Cameron Sprang, MD  levETIRAcetam (KEPPRA) 500 MG tablet Take 1 tablet (500 mg total) by mouth daily. 03/21/16  Yes Burgess Estelle, MD  losartan (COZAAR) 100 MG tablet Take 100  mg by mouth at bedtime.   Yes Historical Provider, MD  metoprolol succinate (TOPROL-XL) 100 MG 24 hr tablet Take 1 tablet (100 mg total) by mouth daily. Take with or immediately following a meal. 05/22/16  Yes Eloise Levels, MD  topiramate (TOPAMAX) 50 MG tablet Take 1 tablet (50 mg total) by mouth 2 (two) times daily. 05/21/16  Yes Eloise Levels, MD  NIFEdipine (PROCARDIA-XL/ADALAT CC) 60 MG 24 hr tablet Take 1 tablet (60 mg total) by mouth daily. 05/22/16   Eloise Levels, MD  venlafaxine XR (EFFEXOR-XR) 150 MG 24 hr capsule TAKE 1 CAPSULE(150 MG) BY MOUTH DAILY Patient not taking: Reported on 05/12/2016 04/19/16   Holley Raring, MD    Family History Family History  Problem Relation Age of Onset  . Hypertension Mother   . Diabetes Maternal Grandmother   . Kidney disease Maternal Grandmother     Social History Social History  Substance Use Topics  . Smoking status: Never Smoker  .  Smokeless tobacco: Never Used  . Alcohol use No     Comment: I stopped drinking along time ago "     Allergies   Bactrim [sulfamethoxazole-trimethoprim]; Reglan [metoclopramide]; Vancomycin; Amlodipine; and Augmentin [amoxicillin-pot clavulanate]   Review of Systems Review of Systems  Constitutional: Positive for fever.  HENT: Negative for congestion.   Respiratory: Positive for cough and shortness of breath.   Gastrointestinal: Positive for diarrhea.  Musculoskeletal: Negative for myalgias.  All other systems reviewed and are negative.    Physical Exam Updated Vital Signs BP (!) 167/112   Pulse (!) 124   Temp (!) 101.8 F (38.8 C) (Rectal)   Resp (!) 38   Ht 5\' 7"  (1.702 m)   Wt 150 lb (68 kg)   SpO2 96%   BMI 23.49 kg/m   Physical Exam  Constitutional: He is oriented to person, place, and time. He appears well-developed and well-nourished. No distress.  HENT:  Head: Normocephalic and atraumatic.  Right Ear: Hearing normal.  Left Ear: Hearing normal.  Nose: Nose normal.  Mouth/Throat: Oropharynx is clear and moist and mucous membranes are normal.  Eyes: Conjunctivae and EOM are normal. Pupils are equal, round, and reactive to light.  Neck: Normal range of motion. Neck supple.  Cardiovascular: S1 normal and S2 normal.  Exam reveals no gallop and no friction rub.   No murmur heard. Tachypnea.    Pulmonary/Chest: Effort normal. No respiratory distress. He exhibits no tenderness.  Diffused rhonchi. Difficulty breathing.  Abdominal: Soft. Normal appearance and bowel sounds are normal. There is no hepatosplenomegaly. There is no tenderness. There is no rebound, no guarding, no tenderness at McBurney's point and negative Murphy's sign. No hernia.  Musculoskeletal: Normal range of motion.  Neurological: He is alert and oriented to person, place, and time. He has normal strength. No cranial nerve deficit or sensory deficit. Coordination normal. GCS eye subscore is 4. GCS  verbal subscore is 5. GCS motor subscore is 6.  Skin: Skin is warm, dry and intact. No rash noted. No cyanosis.  Psychiatric: He has a normal mood and affect. His speech is normal and behavior is normal. Thought content normal.  Nursing note and vitals reviewed.    ED Treatments / Results   DIAGNOSTIC STUDIES: Oxygen Saturation is 100% on RA, normal by my interpretation.    COORDINATION OF CARE: 12:03 AM Discussed treatment plan with pt at bedside which includes labs, chest imaging, EKG and pt agreed to plan.   Labs (all labs  ordered are listed, but only abnormal results are displayed) Labs Reviewed  COMPREHENSIVE METABOLIC PANEL - Abnormal; Notable for the following:       Result Value   Potassium 6.2 (*)    Chloride 98 (*)    BUN 64 (*)    Creatinine, Ser 11.75 (*)    Albumin 3.2 (*)    ALT 12 (*)    GFR calc non Af Amer 5 (*)    GFR calc Af Amer 6 (*)    Anion gap 16 (*)    All other components within normal limits  CBC WITH DIFFERENTIAL/PLATELET - Abnormal; Notable for the following:    RBC 2.86 (*)    Hemoglobin 8.9 (*)    HCT 27.5 (*)    RDW 21.0 (*)    All other components within normal limits  I-STAT ARTERIAL BLOOD GAS, ED - Abnormal; Notable for the following:    pH, Arterial 7.487 (*)    Acid-Base Excess 4.0 (*)    All other components within normal limits  CULTURE, BLOOD (ROUTINE X 2)  CULTURE, BLOOD (ROUTINE X 2)  URINE CULTURE  INFLUENZA PANEL BY PCR (TYPE A & B)  URINALYSIS, ROUTINE W REFLEX MICROSCOPIC  BLOOD GAS, ARTERIAL  I-STAT CG4 LACTIC ACID, ED  I-STAT CG4 LACTIC ACID, ED  I-STAT BETA HCG BLOOD, ED ([facility], [facility], [facility] ONLY)    EKG  EKG Interpretation  Date/Time:  Monday May 29 2016 23:54:18 EDT Ventricular Rate:  120 PR Interval:    QRS Duration: 94 QT Interval:  321 QTC Calculation: 454 R Axis:   -8 Text Interpretation:  Sinus tachycardia Probable left atrial enlargement ST elev, probable normal early repol pattern No significant change  since last tracing Confirmed by Sweetie Giebler  MD, Chavela Justiniano [phone]) on 05/30/2016 12:18:59 AM       Radiology Ct Angio Chest Pe W Or Wo Contrast  Result Date: 05/30/2016 CLINICAL DATA:  Dyspnea for 1 day. EXAM: CT ANGIOGRAPHY CHEST WITH CONTRAST TECHNIQUE: Multidetector CT imaging of the chest was performed using the standard protocol during bolus administration of intravenous contrast. Multiplanar CT image reconstructions and MIPs were obtained to evaluate the vascular anatomy. CONTRAST:  80 mL Isovue 370 intravenous COMPARISON:  05/12/2016 FINDINGS: Cardiovascular: No Wyatt or central pulmonary emboli. Reduced sensitivity for detection of small peripheral emboli. Small pericardial effusion, reduced from 05/12/2016. Mediastinum/Nodes: No enlarged mediastinal, hilar, or axillary lymph nodes. Thyroid gland, trachea, and esophagus demonstrate no significant findings. Lungs/Pleura: Diffuse central airspace opacities and interlobular septal thickening, likely interstitial and alveolar edema. Infectious infiltrates less likely but not excluded. Airways are patent and normal in caliber. Small pleural effusions bilaterally. Upper Abdomen: No acute abnormality. Musculoskeletal: No chest wall abnormality. No acute or significant osseous findings. Review of the MIP images confirms the above findings. IMPRESSION: 1. No Wyatt or central pulmonary emboli 2. Diffuse interstitial and alveolar opacity. This may represent congestive failure or fluid overload. Infectious infiltrates are less likely. 3. Small pleural effusions. Electronically Signed   By: Andreas Newport M.D.   On: 05/30/2016 03:41   Dg Chest Portable 1 View  Result Date: 05/30/2016 CLINICAL DATA:  Dyspnea, worsening today. EXAM: PORTABLE CHEST 1 VIEW COMPARISON:  05/21/2016 FINDINGS: Stable cardiomegaly. Mild hazy central and basilar opacities, likely alveolar edema. Moderate interstitial fluid or thickening. No Wyatt pleural effusions. IMPRESSION: Stable  cardiomegaly.  Interstitial and alveolar edema. Electronically Signed   By: Andreas Newport M.D.   On: 05/30/2016 00:27    Procedures Procedures (including critical  care time)  Medications Ordered in ED Medications  vancomycin (VANCOCIN) 1,250 mg in sodium chloride 0.9 % 250 mL IVPB (1,250 mg Intravenous Not Given 05/30/16 0341)  iopamidol (ISOVUE-370) 76 % injection (100 mLs  Incomplete 05/30/16 0253)  acetaminophen (TYLENOL) tablet 650 mg (650 mg Oral Given 05/30/16 0606)    Or  acetaminophen (TYLENOL) suppository 650 mg ( Rectal See Alternative 05/30/16 0606)  cefTAZidime (FORTAZ) 1 g in dextrose 5 % 50 mL IVPB (not administered)  cefTAZidime (FORTAZ) 1 g in dextrose 5 % 50 mL IVPB (not administered)  pentafluoroprop-tetrafluoroeth (GEBAUERS) aerosol 1 application (not administered)  lidocaine (PF) (XYLOCAINE) 1 % injection 5 mL (not administered)  lidocaine-prilocaine (EMLA) cream 1 application (not administered)  0.9 %  sodium chloride infusion (not administered)  0.9 %  sodium chloride infusion (not administered)  heparin injection 1,000 Units (not administered)  alteplase (CATHFLO ACTIVASE) injection 2 mg (not administered)  heparin injection 2,700 Units (not administered)  aztreonam (AZACTAM) 2 g in dextrose 5 % 50 mL IVPB (0 g Intravenous Stopped 05/30/16 0204)     Initial Impression / Assessment and Plan / ED Course  I have reviewed the triage vital signs and the nursing notes.  Pertinent labs & imaging results that were available during my care of the patient were reviewed by me and considered in my medical decision making (see chart for details).     00:55 - Refusing antibiotics. Patient ordered empiric antibiotic coverage for presumed possible sepsis secondary to fever, tachycardia and HIV status. Patient refusing antibiotics because he does not want any more fluid given to him.Patient not given IV fluid bolus, despite possibility of sepsis, because she is already clearly  volume overloaded and a dialysis patient.  Patient also refusing treatment for his hypertension. He reports that if his blood pressure is treated before dialysis than his dialysis cannot be performed because of hypotension.  Patient has essentially refused all the treatment that he needs at arrival.  0127 - discussed patient with Dr. Posey Pronto, on call for nephrology. Patient will be seen for dialysis.  Patient discussed with family practice for admission. They did request BiPAP. Patient has refused this, however. He did, however, consent to CT angiography. This was performed and did not show any evidence of PE. Patient will be admitted to step down unit for further management.  CRITICAL CARE Performed by: Jim Wyatt   Total critical care time: 30 minutes  Critical care time was exclusive of separately billable procedures and treating other patients.  Critical care was necessary to treat or prevent imminent or life-threatening deterioration.  Critical care was time spent personally by me on the following activities: development of treatment plan with patient and/or surrogate as well as nursing, discussions with consultants, evaluation of patient's response to treatment, examination of patient, obtaining history from patient or surrogate, ordering and performing treatments and interventions, ordering and review of laboratory studies, ordering and review of radiographic studies, pulse oximetry and re-evaluation of patient's condition.   Final Clinical Impressions(s) / ED Diagnoses   Final diagnoses:  Acute pulmonary edema ([place])  Hypertensive urgency    New Prescriptions New Prescriptions   No medications on file    I personally performed the services described in this documentation, which was scribed in my presence. The recorded information has been reviewed and is accurate.     Jim Greek, MD 05/30/16 [phone]

## 2016-05-30 NOTE — Progress Notes (Signed)
Pt is refusing NIV at this time. Pt states that it makes it worst placing a mask over his face to breath. Pt states that he wants to go to dialysis. Pt is stable at this time. SATs are acceptable

## 2016-05-30 NOTE — ED Notes (Signed)
Pt. transported to hemodialysis unit.

## 2016-05-30 NOTE — Progress Notes (Signed)
Family Medicine Teaching Service Daily Progress Note Intern Pager: [phone]  Patient name: Jim Wyatt Medical record number: [id number] Date of birth: [birth year] Age: 36 y.o. Gender: male  Primary Care Provider: [name], MD Consultants: Nephrology Code Status: Full (confirmed on admission)  Pt Overview and Major Events to Date:  04/03: Admit to SDU for acute respiratory failure meeting SIRS, received HD  Assessment and Plan: Jim Wyatt is a 36 y.o. male presenting with dyspnea. PMH is significant for poorly controlled hypertension, headache, HIV with AIDS, end-stage renal disease on dialysis (T/TH/S) and history of DVT x 1 in 2015 (not on any anti-coagulation).  #Hypoxic Acute Respiratory Failure  Sepsis  PCP Pneumonia: Stable. Remains on 4 L [place] (no O2 requirement at home). Febrile at 101.8 at midnight. Tachycardic in 120s and hypertensive and 170/110s. ABG c/w alkalosis and normal bicarbonate. CXR concerning for mod interstitial and alveolar edema. DDx glutes HAP, fluid overload secondary to ESRD. Opportunistic infections are in consideration given AIDS status. PE r/o by CTA. --Clindamycin 800 mg IV q8h + Primaquine 30 mg PO QD (day 1, 4/3-), s/p fortaz+aztreonam (4/3), patient refused vancomycin --Prednisone 40 mg BID --Nephrology consulted as he will need hemodialysis, appreciate their assistance --Continuous pulse ox with supplemental O2, goal at or >92%, pt refusing BiPAP --Blood and urine cultures pending  #Renovascular Hypertension  Tachycardia:Hypertensive in 170/110s. Tachycardic in 120s. Likely needs HD. Nephrology following. On ARB, BB, and hydralazine at home. Compliant with HTN outpatient. --Continue hydralazine 100 mg QD, metoprolol 100 mg QD, and losartan 100 mg QD --Patient refusing any additional antihypertensive medications at this time --Continuous cardiac monitoring  #End-stage renal disease: Receives dialysis on a T/TH/S. Nephrology  consulted in the ED. --Nephrology consulted;appreciate assistance --AM renal function panel  #HIV disease/advanced AIDS: Last CD4 count on 3/16 was 1 and HIV RNA Quant's, log 3.210. Infectious disease specialists saw patient while he was hospitalized last and recommended continuing Prezcobix, abacavir, Tivicay and scheduled outpatient follow-up per patient. He was scheduled for follow-up 4/3. --ID consulted, appreciate recommendations --Continue Prezcobix, Abacavir, Tivicay --Hold home dapsone and azithromycin while he is on IV antibiotics  #Abdominal swelling and diarrhea: Noted to have this in the past when fluid overloaded. Does have some mild tenderness to deep palpation in perimbilical region. Likely from fluid overload vs gastroenteritis. --Can consider abdominal ultrasound if patient continues to have abdominal swelling and pain after HD, there is always a chance that he could have a peritoneal infection   #History of DVT: Bilateral 2015 9/15. Due to immobility. Completed course of anticoagulation. No LE tenderness, erythema or edema. 2+ DP pulses bilaterally.  --F/u CTA chest to r/o PE --Avoiding SCDs, will get subcutaneous heparin for prophylaxis  #Seizure disorder: Last seizure in January. Has only had 2 seizures in his lifetime.Taking keppra every day.  --Continue keppra 500 mg daily and an additional 250 mg on dialysis days (T/TH/S)  #Possible OSA: Supposed to have sleep study. Does not use CPAP.  --F/u sleep study as an outpatient  #Asthma: Takes albuterol when necessary at home. Denies tobacco.  --Duonebs PRN --Outpatient PFTs  #Medical noncompliance: Patient refusing to take recommended antibiotics, antihypertensives, and BiPAP. Explained to patient the risks of refusing, he expresses understanding.  #Hx of Headache: Denies headache currently. Was recently admitted last month for headache with visual changes. Started on Topamax and hypertensive medications were  adjusted --Continue Topamax  --Continue HTN meds as above  FEN/GI: Renal diet with fluid restriction Prophylaxis: Heparin SQ  Disposition:  Pending improvement of respiratory status.  Subjective:  Patient complaining of left leg pain near thigh as of today with cramping sensation. Continues to have shortness of breath but improved. Denies chest pain. States he has not missed dialysis and was supposed to follow-up with ID today.  Objective: Temp:  [100.3 F (37.9 C)-101.8 F (38.8 C)] 101.8 F (38.8 C) (04/03 0029) Pulse Rate:  [116-124] 123 (04/03 0630) Resp:  [18-38] 29 (04/03 0630) BP: (167-197)/(107-136) 173/115 (04/03 0630) SpO2:  [96 %-100 %] 96 % (04/03 0630) Weight:  [150 lb (68 kg)] 150 lb (68 kg) (04/02 2351) Physical Exam: General: drowsy receiving HD, well developed, non-toxic appearance HEENT: normocephalic, atraumatic, moist mucous membranes CV: tachycardic with regular rhythm without murmurs, rubs, or gallops Lungs: coarse rhonchi bilaterally more on left lung fields, no wheezing, increased work of breathing on 4L [place], able to speak in short sentences Abdomen: soft, non-tender, normoactive bowel sounds Skin: warm, dry, no rashes or lesions, cap refill < 2 seconds Extremities: warm and well perfused, normal tone, symmetrical calves without erythema or palpable cords, negative Homans sign  Laboratory:  Recent Labs Lab 05/30/16 0030  WBC 6.2  HGB 8.9*  HCT 27.5*  PLT 167    Recent Labs Lab 05/30/16 0030  NA 139  K 6.2*  CL 98*  CO2 25  BUN 64*  CREATININE 11.75*  CALCIUM 9.5  PROT 7.4  BILITOT 1.0  ALKPHOS 47  ALT 12*  AST 22  GLUCOSE 81   Urinalysis    Component Value Date/Time   COLORURINE YELLOW 12/28/2015 [place] 12/28/2015 2303   LABSPEC 1.019 12/28/2015 2303   PHURINE 8.0 12/28/2015 2303   GLUCOSEU 100 (A) 12/28/2015 2303   HGBUR MODERATE (A) 12/28/2015 2303   BILIRUBINUR Negative 01/19/2016 [place] 12/28/2015 2303   PROTEINUR >=300 01/19/2016 0934   PROTEINUR >300 (A) 12/28/2015 2303   UROBILINOGEN 0.2 01/19/2016 0934   UROBILINOGEN 0.2 12/18/2014 1925   NITRITE Negative 01/19/2016 0934   NITRITE NEGATIVE 12/28/2015 2303   LEUKOCYTESUR Negative 01/19/2016 0934   Urine culture: Pending Blood culture: Pending  Lactic acid: 1.39 > 1.22 Influenza panel: Negative I-STAT arterial blood gas: pH 7.487: PCO2 36.1: PCO2 85.0: Bicarbonate 27.0: O2 sat 96%  Imaging/Diagnostic Tests: CT ANGIO CHEST PE W OR WO CONTRAST (05/30/2016) FINDINGS: Cardiovascular: No large or central pulmonary emboli. Reduced sensitivity for detection of small peripheral emboli. Small pericardial effusion, reduced from 05/12/2016. Mediastinum/Nodes: No enlarged mediastinal, hilar, or axillary lymph nodes. Thyroid gland, trachea, and esophagus demonstrate no significant findings. Lungs/Pleura: Diffuse central airspace opacities and interlobular septal thickening, likely interstitial and alveolar edema. Infectious infiltrates less likely but not excluded. Airways are patent and normal in caliber. Small pleural effusions bilaterally. Upper Abdomen: No acute abnormality. Musculoskeletal: No chest wall abnormality. No acute or significant osseous findings. Review of the MIP images confirms the above findings.  IMPRESSION: 1. No large or central pulmonary emboli 2. Diffuse interstitial and alveolar opacity. This may represent congestive failure or fluid overload. Infectious infiltrates are less likely. 3. Small pleural effusions.  DG Chest Portable 1 View (05/30/2016) FINDINGS: Stable cardiomegaly. Mild hazy central and basilar opacities, likely alveolar edema. Moderate interstitial fluid or thickening. No large pleural effusions.  IMPRESSION: Stable cardiomegaly.  Interstitial and alveolar edema.  [place] Bing, DO 05/30/2016, 7:08 AM PGY-1, [place] Intern pager: [phone],  text pages welcome

## 2016-05-30 NOTE — Progress Notes (Addendum)
Pharmacy Antibiotic Note  Jim Wyatt is a 36 y.o. male with h/o ESRD and HIV admitted on 05/29/2016 with SOB/sepsis.  Pharmacy has been consulted for Vancomycin and Aztreonam dosing.  Noted patient is currently refusing Vancomycin.    Plan: Vancomycin 1250 mg IV now, then 750 mg IV after each HD Aztreonam 500 mg IV q12h  Height: 5\' 7"  (170.2 cm) Weight: 150 lb (68 kg) IBW/kg (Calculated) : 66.1  Temp (24hrs), Avg:101.1 F (38.4 C), Min:100.3 F (37.9 C), Max:101.8 F (38.8 C)   Recent Labs Lab 05/30/16 0030 05/30/16 0054 05/30/16 0354  WBC 6.2  --   --   CREATININE 11.75*  --   --   LATICACIDVEN  --  1.39 1.22    Estimated Creatinine Clearance: 8.4 mL/min (A) (by C-G formula based on SCr of 11.75 mg/dL (H)).    Allergies  Allergen Reactions  . Bactrim [Sulfamethoxazole-Trimethoprim] Other (See Comments)    Renal failure  . Reglan [Metoclopramide] Other (See Comments)    Causes ticks/ jerks  . Vancomycin Other (See Comments)    Patient states vancomycin caused kidney injury  . Amlodipine Palpitations and Other (See Comments)    fatigue  . Augmentin [Amoxicillin-Pot [place] 05/30/2016 4:48 AM   Addendum Since pt is refusing Vancomycin at this time, spoke with Dr. Juanito Doom and will change Aztreonam to Fortaz 1 g IV q24h--has received cephalosporins on multiple occasions in the past-- for Gram positive coverage   Phillis Knack, PharmD, BCPS  05/30/2016 5:33 AM

## 2016-05-30 NOTE — Progress Notes (Signed)
ABG collected  

## 2016-05-30 NOTE — H&P (Signed)
[facility] Admission History and Physical Service Pager: [phone]  Patient name: Jim Wyatt Medical record number: [id number] Date of birth: 01-20-1981 Age: 36 y.o. Gender: male  Primary Care Provider: [name], MD Consultants: Nephro Code Status: Full (per discussion on admission)  Chief Complaint: Dyspnea  Assessment and Plan: Jim Wyatt is a 36 y.o. male presenting with dyspnea. PMH is significant for poorly controlled hypertension, headache, HIV with AIDS, end-stage renal disease on dialysis (T/TH/S) and history of DVT x 1 in 2015 (not on any anti-coagulation).  Acute respiratory failure associated with SIRS: Vital signs unstable with temperature of 101.8, heart rates in the 120s, respiratory rate in the 30s and blood pressures ranging from 945-038U systolically and 828 to 003K diastolically. On 4 Jim nasal cannula in the ED, no O2 requirement at home. Physical exam showing tachypnea, slightly decreased lung sounds bilaterally with occasional rhonchi, no crackles or wheezes. Abdomen is slightly distended with tenderness to deep palpation near umbilicus. Abnormal labs include a potassium of 6.2, chloride of 98, creatinine of 11.75. Lactic acid normal at 1.39. No leukocytosis with a white blood cell count of 6.2. Hemoglobin stable at 8.9. Platelets 167. ABG showing very slight alkalosis with a pH of 7.487 with normal bicarbonate and CO2. EKG showing sinus tachycardia with slight ST elevation likely from repolarization. Chest x-ray concerning for moderate interstitial and alveolar edema. Differentials include hospital-acquired pneumonia versus fluid overload secondary to ESRD versus pulmonary embolism. With history of AIDS, there are also concerns for opportunistic infections. -Admit to family practice teaching service, stepdown unit, attending Jim Wyatt  -vital signs per floor protocol -Recommending vancomycin and aztreonam, patient refusing  vancomycin at this time but agreeable to aztreonam. Pharmacy called about patient and we will add fortaz at this time for gram + coverage -Nephrology consulted as he will need hemodialysis, appreciate their assistance -Continuous pulse ox -O2 supplementation, goal O2 above 92% -Recommending BiPAP at this time due to tachypnea and increased work of breathing, patient refusing -Blood cultures pending -Obtain urinalysis and urine culture if patient is able to provide a sample -CTA ordered to rule out PE  -If patient continues to be febrile while on IV antibiotics may consider adding IV antifungals  Renovascular hypertension and tachycardia: Blood pressures ranging from 917-915A systolically and 569 to 794I diastolically. heart rates in the 120s. Likely needs hemodialysis. Takes losartan 100 mg daily, hydralazine 100 mg 3 times a day and apparently takes cartia xt, but this is not on his medication list.  - Continue hydralazine 100 mg daily, metoprolol 100 mg daily, and losartan 100 mg daily - Patient refusing any additional antihypertensive medications at this time - continuous cardiac monitoring  End-stage renal disease: Receives dialysis on a T/TH/S. Nephrology consulted in the ED. - nephrology consulted;appreciate assistance - AM renal function panel  HIV disease/advanced AIDS: Last CD4 count on 3/16 was 1 and HIV RNA Quant's, log 3.210. Infectious disease specialists saw patient while he was hospitalized last and recommended continuing Prezcobix, abacavir, Tivicay and scheduled outpatient follow-up per patient. He was scheduled for follow-up in early April however there are no notes in Epic to show that patient had an infectious disease visit. - continue Prezcobix, abacavir, Tivicay -Hold home dapsone and azithromycin while he is on IV antibiotics - Consult ID in the morning for recommendations while inpatient  Abdominal swelling and diarrhea: Noted to have this in the past when fluid  overloaded. Does have some mild tenderness to deep palpation in  perimbilical region. Likely from fluid overload vs gastroenteritis -Can consider abdominal ultrasound if patient continues to have abdominal swelling and pain after hemodialysis, there is always a chance that he could have a peritoneal infection   History of DVT: Bilateral 2015 9/15. Due to immobility. Completed course of anticoagulation. No LE tenderness, erythema or edema. 2+ DP pulses bilaterally.  - f/u CTA chest to r/o PE - avoiding SCDs, will get subcutaneous heparin for prophylaxis  Seizure disorder: Last seizure in January. Has only had 2 seizures in his lifetime. Taking keppra every day.  - continue keppra 500mg  daily and an additional 250mg  on dialysis days (T/TH/S)  Possible OSA: Supposed to have sleep study. Does not use CPAP.  - f/u sleep study as an outpatient  Asthma: Takes albuterol when necessary at home. Denies tobacco.  - duonebs PRN - outpatient PFTs  Medical noncompliance: Patient refusing to take recommended antibiotics, antihypertensives, and BiPAP. Explained to patient the risks of refusing, he expresses understanding.  Hx of Headache: Denies headache currently. Was recently admitted last month for headache with visual changes. Started on Topamax and hypertensive medications were adjusted - Continue Topamax  - Continue HTN meds as above  FEN/GI: renal diet with fluid restriction Prophylaxis: subcutaneous heparin  Disposition: Admit to stepdown unit, attending Jim Wyatt  History of Present Illness:  Jim Wyatt is a 36 y.o. male with past medical history of poorly controlled hypertension, HIV, end-stage renal disease on dialysis (T/TH/S) and history of DVT x 1 in 2015 (not on any anti-coagulation), headache presenting with shortness of breath.   History Limited as patient can only speak in 1-2 word sentences as he has very dyspneic.   He notes that around around 4 AM on April 2 he  suddenly became short of breath and it has been getting progressively worse. His shortness of breath is associated with diffuse pleuritic chest pain. He is very tired from Jim Wyatt is breathing and notes that he "needs dialysis now ". He called EMS and he received an albuterol treatment on his way to the hospital but that provided no relief for him. He notes that he has been compliant with all of his dialysis sessions. Usually gets dialysis Tuesday, Thursday, and Saturday. He was able to go on Saturday. Admits to intermittent nonproductive coughing with subjective fever and intermittent diarrhea. No nausea or vomiting. He has not eaten or drank anything today due to the shortness of breath. He was recently in the hospital from March 16 to March 25 and was treated for headache with vision changes as well as shortness of breath.   In the ED patient's was refusing vancomycin and continues to refuse vancomycin as he states that it will cause him to have too much fluid and additionally he thinks that it will make his kidneys worse.  Review Of Systems: Per HPI with the following additions: See history of present illness  ROS  Patient Active Problem List   Diagnosis Date Noted  . Chest pressure   . Palliative care encounter   . Goals of care, counseling/discussion   . Shortness of breath 05/12/2016  . Dyspnea   . Hypertensive emergency   . Pericardial effusion   . Tachycardia   . HIV disease ([place])   . OSA (obstructive sleep apnea) 04/19/2016  . Hospital discharge follow-up 04/19/2016  . Depression 03/24/2016  . Breast lump or mass   . Rectal bleeding   . GI bleed 03/15/2016  . Hyperkalemia   . New  onset seizure ([place]) 03/13/2016  . Symptomatic anemia 02/01/2016  . Dysuria 01/18/2016  . Hematochezia 01/09/2016  . Postoperative anemia due to acute blood loss 01/09/2016  . Seizure disorder ([place]) 12/29/2015  . Intussusception ([place]) 12/28/2015  . Hypertension 12/28/2015  . Otitis externa of both  ears 11/21/2015  . AIDS (acquired immunodeficiency syndrome), CD4 <=200 ([place])   . Meningitis   . Cephalalgia   . Blurry vision, bilateral   . Headache   . Hypertensive urgency   . New onset of headache in immunocompromised patient ([place]) 07/06/2015  . ESRD on dialysis ([place]) 04/27/2015  . CAP (community acquired pneumonia) 02/25/2015  . ESRD (end stage renal disease) ([place]) 02/16/2015  . History of DVT (deep vein thrombosis) 01/12/2015  . Candidal esophagitis ([place])   . Constipation   . Nausea without vomiting 12/20/2014  . Abscess of right thigh 12/19/2014  . Chronic kidney disease (CKD), stage IV (severe) ([place]) 12/19/2014  . 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 Anemia due to end stage renal disease ([place]) 12/19/2014  . Hyponatremia 12/19/2014  . Community acquired pneumonia 07/30/2014  . Anemia of chronic disease 07/30/2014  . Acute on chronic renal failure ([place])   . 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 CMV (cytomegalovirus infection) ([place]) 12/07/2013  . Protein-calorie malnutrition, severe ([place]) 12/06/2013  . 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 DVT (deep venous thrombosis) ([place]) 11/24/2013  . Asthma, chronic 11/24/2013  . Acquired immune deficiency syndrome ([place]) 11/14/2013  . AKI (acute kidney injury) ([place]) 11/11/2013  . Elevated liver enzymes 11/09/2013  . History of shingles 11/09/2013  . Obesity 11/09/2013  . Abdominal pain 11/07/2013  . Asthma     Past Medical History: Past Medical History:  Diagnosis Date  . Asthma   . CAP (community acquired pneumonia) 10/2013  . Cellulitis 11/2013   LLE  . DVT (deep venous thrombosis) ([place]) 10/2013   BLE  . ESRD on hemodialysis ([place]) 01/2015   biopsy proven FSGS with ATN on renal bx from Sept 2015. Went on HD Dec 2016.   [name] Fever   . Headache   . HIV infection ([place]) dx'd 10/2013  . Hypertension   . Leg pain   . Multiple environmental allergies    "trees, dogs,  cats"  . Multiple food allergies   . Shingles < 2010  . 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 AV FISTULA PLACEMENT Left 03/03/2015   Procedure: Creation of Left Arm RADIOCEPHALIC ARTERIOVENOUS FISTULA ;  Surgeon: Mal Misty, MD;  Location: [facility];  Service: Vascular;  Laterality: Left;  . COLONOSCOPY N/A 03/18/2016   Procedure: COLONOSCOPY;  Surgeon: Manus Gunning, MD;  Location: [facility];  Service: Gastroenterology;  Laterality: N/A;  . FEMUR FRACTURE SURGERY Right 09/2001  . FRACTURE SURGERY    . I&D EXTREMITY Right 12/18/2014   Procedure: IRRIGATION AND DEBRIDEMENT RIGHT LOWER EXTREMITY;  Surgeon: Ralene Ok, MD;  Location: [facility];  Service: General;  Laterality: Right;  . INSERTION OF DIALYSIS CATHETER  2016  . LAPAROTOMY N/A 12/28/2015   Procedure: EXPLORATORY LAPAROTOMY BOWEL RESECTION ILEOCECECTOMY;  Surgeon: Clovis Riley, MD;  Location: [facility];  Service: General;  Laterality: N/A;    Social History: Social History  Substance Use Topics  . Smoking status: Never Smoker  . Smokeless tobacco: Never Used  . Alcohol use No     Comment: I stopped drinking along time ago "  Please also refer to relevant sections of EMR.  Family History: Family History  Problem Relation Age of Onset  . Hypertension Mother   . Diabetes Maternal Grandmother   . Kidney disease Maternal Grandmother     Allergies and Medications: Allergies  Allergen Reactions  . Bactrim [Sulfamethoxazole-Trimethoprim] Other (See Comments)    Renal failure  . Reglan [Metoclopramide] Other (See Comments)    Causes ticks/ jerks  . Vancomycin Other (See Comments)    Patient states vancomycin caused kidney injury  . Amlodipine Palpitations and Other (See Comments)    fatigue  . Augmentin [Amoxicillin-Pot Clavulanate] Hives   No current facility-administered medications on file prior to encounter.     Current Outpatient Prescriptions on File Prior to Encounter  Medication Sig Dispense Refill  . abacavir (ZIAGEN) 300 MG tablet Take 2 tablets (600 mg total) by mouth daily with supper. 60 tablet 11  . acetaminophen (TYLENOL) 500 MG tablet Take 1,000 mg by mouth every 6 (six) hours as needed (pain).    [name] albuterol (PROVENTIL HFA;VENTOLIN HFA) 108 (90 Base) MCG/ACT inhaler Inhale 2 puffs into the lungs every 6 (six) hours as needed for wheezing or shortness of breath. 3 Inhaler 3  . Ascorbic Acid (VITAMIN C) 250 MG CHEW Chew 500 mg by mouth daily.     [name] azithromycin (ZITHROMAX) 600 MG tablet Take 1,200 mg by mouth once a week.     . dapsone 100 MG tablet Take 1 tablet (100 mg total) by mouth daily. 30 tablet 11  . darunavir-cobicistat (PREZCOBIX) 800-150 MG tablet Take 1 tablet by mouth every evening. Swallow whole. Do NOT crush, break or chew tablets. Take with food. 30 tablet 11  . dolutegravir (TIVICAY) 50 MG tablet Take 1 tablet (50 mg total) by mouth every evening. 30 tablet 11  . hydrALAZINE (APRESOLINE) 25 MG tablet Take 3 tablets (75 mg total) by mouth 3 (three) times daily. 90 tablet 0  . levETIRAcetam (KEPPRA) 250 MG tablet Take 1 tablet (250 mg) by mouth on Tuesday, Thursday, Saturday at 7pm (Patient taking differently: Take 250-500 mg by mouth See admin instructions. takes extra 250mg  on dialysis days Tu, Thurs, Sunday) 60 tablet 11  . levETIRAcetam (KEPPRA) 500 MG tablet Take 1 tablet (500 mg total) by mouth daily. 60 tablet 11  . losartan (COZAAR) 100 MG tablet Take 100 mg by mouth at bedtime.    . metoprolol succinate (TOPROL-XL) 100 MG 24 hr tablet Take 1 tablet (100 mg total) by mouth daily. Take with or immediately following a meal. 30 tablet 0  . topiramate (TOPAMAX) 50 MG tablet Take 1 tablet (50 mg total) by mouth 2 (two) times daily. 30 tablet 0  . NIFEdipine (PROCARDIA-XL/ADALAT CC) 60 MG 24 hr tablet Take 1 tablet (60 mg total) by mouth daily. 30 tablet 0  . venlafaxine XR  (EFFEXOR-XR) 150 MG 24 hr capsule TAKE 1 CAPSULE(150 MG) BY MOUTH DAILY (Patient not taking: Reported on 05/12/2016) 30 capsule 0    Objective: BP (!) 180/112   Pulse (!) 121   Temp (!) 101.8 F (38.8 C) (Rectal)   Resp (!) 32   Ht 5\' 7"  (1.702 m)   Wt 150 lb (68 kg)   SpO2 99%   BMI 23.49 kg/m  Exam: General: African-American gentleman sitting up in bed With tachypnea Eyes: Pupils equal and reactive to light, nonicteric sclera ENTM: Slightly dry mucous membranes, no lymphadenopathy appreciated, no oropharyngeal erythema or thrush noted Neck: Supple Cardiovascular: Tachycardic rate, regular rhythm,  no peripheral edema, 2+ DP pulses bilaterally, left upper extremity AV fistula with palpable thrill Respiratory: Nasal cannula in place, Increased work of breathing with tachypnea, slightly decreased lung sounds bilaterally with occasional rhonchi, no crackles or wheezes. Able to speak in 1-2 word sentences Gastrointestinal: Soft, slightly distended, well-healed scar over her umbilicus, slightly tender to deep palpation over her umbilicus, normal bowel sounds MSK: Able to move all extremities spontaneously Derm: Diffuse hyperpigmented dry patches Neuro: Alert and oriented 3, normal sensation throughout, 5 out of 5 strength in bilateral upper and lower extremities Psych: Normal mood and affect  Labs and Imaging: CBC BMET   Recent Labs Lab 05/30/16 0030  WBC 6.2  HGB 8.9*  HCT 27.5*  PLT 167    Recent Labs Lab 05/30/16 0030  NA 139  K 6.2*  CL 98*  CO2 25  BUN 64*  CREATININE 11.75*  GLUCOSE 81  CALCIUM 9.5     Dg Chest Portable 1 View  Result Date: 05/30/2016 CLINICAL DATA:  Dyspnea, worsening today. EXAM: PORTABLE CHEST 1 VIEW COMPARISON:  05/21/2016 FINDINGS: Stable cardiomegaly. Mild hazy central and basilar opacities, likely alveolar edema. Moderate interstitial fluid or thickening. No large pleural effusions. IMPRESSION: Stable cardiomegaly.  Interstitial and  alveolar edema. Electronically Signed   By: Andreas Newport M.D.   On: 05/30/2016 00:27    Carlyle Dolly, MD 05/30/2016, 1:36 AM PGY-2, [facility] pager: [phone], text pages welcome

## 2016-05-30 NOTE — Consult Note (Addendum)
Date of Admission:  05/29/2016  Date of Consult:  05/30/2016  Reason for Consult: bilateral PNA in patient with HIV/AIDS Referring Physician: Dr. Wendy Poet   HPI: Jim Wyatt is an 36 y.o. 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 He was initially diagnosed in September of 2015 with VL into 67k. His ARV was initially with held I believe due to concerns for CNS crypto, then pt developed ARF and meds still held before he was diagnosed with HIVAN. CD4 was 40 at diagnosis. He did start Isentress BID , renally dosed 3tc and ABC and later achieved perfect Viral suppression w VL <20in June of 2016. He went on to HD. His compliance has been very sporadic since then with high VL in December at 455k. HIs regimen has been  Changed several times since then. His most recent regimen is TIVICAY, PREZCOBIX and ABACAVIR. Our pharmacy student checked with [facility] on [address] and patient had not filled meds there for 4-5 months. I would like to check with [facility] to see if we may have been having his meds filled here or elsewhere. His CD4 has remained low most recently at 4 due to his inconsistent compliance. He is now admitted with fevers, respiratory distress, wheezing and concern for bilateral pneumonia with infiltrates seen bilaterally but best on CT of the lungs.    He was placed on vanco and ceftazidime but refused the former. He also refused IV bactrim saying it makes him sick. He is on steroids though not higher dose we typically use for PCP (he was also not hypoxemic on ABG)  I am concerned he may indeed have PCP and am starting him on clindamycin and primaquine.        Past Medical History:  Diagnosis Date  . Asthma   . CAP (community acquired pneumonia) 10/2013  . Cellulitis 11/2013   LLE  . DVT (deep venous thrombosis) ([place]) 10/2013   BLE  . ESRD on hemodialysis ([place]) 01/2015   AKI with nephrotic syndrome (16gm proteinuria at time of biopsy, from collapsing FSG). 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  Went on HD Dec 2016.   [name] Fever   . Headache   . HIV infection ([place]) dx'd 10/2013  . Hypertension   . Leg pain   . Multiple environmental allergies    "trees, dogs, cats"  . Multiple food allergies   . Shingles < 2010    Past Surgical History:  Procedure Laterality Date  . AV FISTULA PLACEMENT Left 03/03/2015   Procedure: Creation of Left Arm RADIOCEPHALIC ARTERIOVENOUS FISTULA ;  Surgeon: Mal Misty, MD;  Location: [facility];  Service: Vascular;  Laterality: Left;  . COLONOSCOPY N/A 03/18/2016   Procedure: COLONOSCOPY;  Surgeon: Manus Gunning, MD;  Location: [facility];  Service: Gastroenterology;  Laterality: N/A;  . FEMUR FRACTURE SURGERY Right 09/2001  . FRACTURE SURGERY    . I&D EXTREMITY Right 12/18/2014   Procedure: IRRIGATION AND DEBRIDEMENT RIGHT LOWER EXTREMITY;  Surgeon: Ralene Ok, MD;  Location: [place];  Service: General;  Laterality: Right;  . INSERTION OF DIALYSIS CATHETER  2016  . LAPAROTOMY N/A 12/28/2015   Procedure: EXPLORATORY LAPAROTOMY BOWEL RESECTION ILEOCECECTOMY;  Surgeon: Clovis Riley, MD;  Location: [facility];  Service: General;  Laterality: N/A;    Social History:  reports that he has never smoked. He has never used smokeless tobacco. He reports that he does not drink alcohol or use drugs.   Family History  Problem Relation Age of Onset  . Hypertension Mother   . Diabetes Maternal Grandmother   . Kidney disease Maternal Grandmother     Allergies  Allergen Reactions  . Bactrim [Sulfamethoxazole-Trimethoprim] Other (See Comments)     Renal failure  . Reglan [Metoclopramide] Other (See Comments)    Causes ticks/ jerks  . Vancomycin Other (See Comments)    Patient states vancomycin caused kidney injury  . Amlodipine Palpitations and Other (See Comments)    fatigue  . Augmentin [Amoxicillin-Pot Clavulanate] Hives     Medications: I have reviewed patients current medications as documented in Epic Anti-infectives    Start     Dose/Rate Route Frequency Ordered Stop   05/30/16 2200  cefTAZidime (FORTAZ) 1 g in dextrose 5 % 50 mL IVPB  Status:  Discontinued     1 g 100 mL/hr over 30 Minutes Intravenous Daily at bedtime 05/30/16 0536 05/30/16 1322   05/30/16 1800  darunavir-cobicistat (PREZCOBIX) 800-150 MG per tablet 1 tablet     1 tablet Oral Every evening 05/30/16 1401     05/30/16 1800  dolutegravir (TIVICAY) tablet 50 mg     50 mg Oral Every evening 05/30/16 1401     05/30/16 1700  abacavir (ZIAGEN) tablet 600 mg     600 mg Oral Daily with supper 05/30/16 1401     05/30/16 1400  sulfamethoxazole-trimethoprim (BACTRIM) 325 mg in dextrose 5 % 500 mL IVPB  Status:  Discontinued     325 mg 346.9 mL/hr over 90 Minutes Intravenous Every 24 hours 05/30/16 1335 05/30/16 1338   05/30/16 1400  primaquine tablet 30 mg     30 mg Oral Daily 05/30/16 1344     05/30/16 1400  clindamycin (CLEOCIN) IVPB 900 mg     900 mg 100 mL/hr over 30 Minutes Intravenous Every 8 hours 05/30/16 1344     05/30/16 0600  cefTAZidime (FORTAZ) 1 g in dextrose 5 % 50 mL IVPB     1 g 100 mL/hr over 30 Minutes Intravenous  Once 05/30/16 0536 05/30/16 0656   05/30/16 0015  aztreonam (AZACTAM) 2 g in dextrose 5 % 50 mL IVPB     2 g 100 mL/hr over 30 Minutes Intravenous  Once 05/30/16 0009 05/30/16 0204   05/30/16 0015  vancomycin (VANCOCIN) IVPB 1000 mg/200 mL premix  Status:  Discontinued     1,000 mg 200 mL/hr over 60 Minutes Intravenous  Once 05/30/16 0009 05/30/16 0010   05/30/16 0015  vancomycin (VANCOCIN) 1,250 mg in sodium chloride 0.9 % 250  mL IVPB  Status:  Discontinued     1,250 mg 166.7 mL/hr over 90 Minutes Intravenous  Once 05/30/16 0010 05/30/16 1322         ROS:  as in HPI otherwise remainder of 12 point Review of Systems is negative  Blood pressure (!) 176/124, pulse (!) 125, temperature (!) 102.9 F (39.4 C), temperature source Oral, resp. rate (!) 21, height _0  (1.702 m), weight 143 lb 4.8 oz (65 kg), SpO2 97 %. General: Alert and awake,  oriented x3, not in any acute distress. HEENT: anicteric sclera,  EOMI, oropharynx slight coating to tongue Cardiovascular: tachy rate, normal r,  no murmur rubs or gallops Pulmonary: wheezes and rhonchi throughout Gastrointestinal: soft nontender, nondistended, normal bowel sounds, Musculoskeletal: 2+ edema Skin, diffuse hyperpigmented rash on chest and other areas Neuro: nonfocal, strength and sensation intact   Results for orders placed or performed during the hospital encounter of 05/29/16 (from the past 48 hour(s))  Comprehensive metabolic panel     Status: Abnormal   Collection Time: 05/30/16 12:30 AM  Result Value Ref Range   Sodium 139 135 - 145 mmol/L   Potassium 6.2 (H) 3.5 - 5.1 mmol/L   Chloride 98 (L) 101 - 111 mmol/L   CO2 25 22 - 32 mmol/L   Glucose, Bld 81 65 - 99 mg/dL   BUN 64 (H) 6 - 20 mg/dL   Creatinine, Ser 11.75 (H) 0.61 - 1.24 mg/dL   Calcium 9.5 8.9 - 10.3 mg/dL   Total Protein 7.4 6.5 - 8.1 g/dL   Albumin 3.2 (L) 3.5 - 5.0 g/dL   AST 22 15 - 41 U/L   ALT 12 (L) 17 - 63 U/L   Alkaline Phosphatase 47 38 - 126 U/L   Total Bilirubin 1.0 0.3 - 1.2 mg/dL   GFR calc non Af Amer 5 (L) >60 mL/min   GFR calc Af Amer 6 (L) >60 mL/min    Comment: (NOTE) The eGFR has been calculated using the CKD EPI equation. This calculation has not been validated in all clinical situations. eGFR's persistently <60 mL/min signify possible Chronic Kidney Disease.    Anion gap 16 (H) 5 - 15  CBC WITH DIFFERENTIAL     Status: Abnormal   Collection Time:  05/30/16 12:30 AM  Result Value Ref Range   WBC 6.2 4.0 - 10.5 K/uL   RBC 2.86 (L) 4.22 - 5.81 MIL/uL   Hemoglobin 8.9 (L) 13.0 - 17.0 g/dL   HCT 27.5 (L) 39.0 - 52.0 %   MCV 96.2 78.0 - 100.0 fL   MCH 31.1 26.0 - 34.0 pg   MCHC 32.4 30.0 - 36.0 g/dL   RDW 21.0 (H) 11.5 - 15.5 %   Platelets 167 150 - 400 K/uL   Neutrophils Relative % 75 %   Neutro Abs 4.7 1.7 - 7.7 K/uL   Lymphocytes Relative 14 %   Lymphs Abs 0.9 0.7 - 4.0 K/uL   Monocytes Relative 6 %   Monocytes Absolute 0.3 0.1 - 1.0 K/uL   Eosinophils Relative 4 %   Eosinophils Absolute 0.2 0.0 - 0.7 K/uL   Basophils Relative 1 %   Basophils Absolute 0.0 0.0 - 0.1 K/uL  I-Stat CG4 Lactic Acid, ED  (not at  [facility])     Status: None   Collection Time: 05/30/16 12:54 AM  Result Value Ref Range   Lactic Acid, Venous 1.39 0.5 - 1.9 mmol/L  Influenza panel by PCR (type A & B)     Status: None   Collection Time: 05/30/16  1:27 AM  Result Value Ref Range   Influenza A By PCR NEGATIVE NEGATIVE   Influenza B By PCR NEGATIVE NEGATIVE    Comment: (NOTE) The Xpert Xpress Flu assay is intended as an aid in the diagnosis of  influenza and should not be used as a sole basis for treatment.  This  assay is FDA approved for nasopharyngeal swab specimens only. Nasal  washings and aspirates are unacceptable for Xpert Xpress Flu  testing.   I-Stat arterial blood gas, ED     Status: Abnormal   Collection Time: 05/30/16  2:26 AM  Result Value Ref Range   pH, Arterial 7.487 (H) 7.350 - 7.450   pCO2 arterial 36.1 32.0 - 48.0 mmHg   pO2, Arterial 85.0 83.0 - 108.0 mmHg   Bicarbonate 27.0 20.0 - 28.0 mmol/L   TCO2 28 0 - 100 mmol/L   O2 Saturation 96.0 %   Acid-Base Excess 4.0 (H) 0.0 - 2.0 mmol/L   Patient temperature 101.8 F    Sample type ARTERIAL   I-Stat CG4 Lactic Acid, ED  (not at  [facility])     Status: None   Collection Time: 05/30/16  3:54 AM  Result Value Ref Range   Lactic Acid, Venous 1.22 0.5 - 1.9 mmol/L  Lipase, blood     Status:  None   Collection Time: 05/30/16  7:32 AM  Result Value Ref Range   Lipase 36 11 - 51 U/L  Renal function panel     Status: Abnormal   Collection Time: 05/30/16  2:42 PM  Result Value Ref Range   Sodium 134 (L) 135 - 145 mmol/L   Potassium 5.8 (H) 3.5 - 5.1 mmol/L   Chloride 97 (L) 101 - 111 mmol/L   CO2 27 22 - 32 mmol/L   Glucose, Bld 81 65 - 99 mg/dL   BUN 29 (H) 6 - 20 mg/dL   Creatinine, Ser 6.96 (H) 0.61 - 1.24 mg/dL    Comment: DELTA CHECK NOTED   Calcium 8.9 8.9 - 10.3 mg/dL   Phosphorus 4.9 (H) 2.5 - 4.6 mg/dL   Albumin 3.0 (L) 3.5 - 5.0 g/dL   GFR calc non Af Amer 9 (L) >60 mL/min   GFR calc Af Amer 11 (L) >60 mL/min    Comment: (NOTE) The eGFR has been calculated using the CKD EPI equation. This calculation has not been validated in all clinical situations. eGFR's persistently <60 mL/min signify possible Chronic Kidney Disease.    Anion gap 10 5 - 15   _0 (sdes,specrequest,cult,reptstatus)   ) Recent Results (from the past 720 hour(s))  MRSA PCR Screening     Status: Abnormal   Collection Time: 05/12/16  6:13 PM  Result Value Ref Range Status   MRSA by PCR POSITIVE (A) NEGATIVE Final    Comment:        The GeneXpert MRSA Assay (FDA approved for NASAL specimens only), is one component of a comprehensive MRSA colonization surveillance program. It is not intended to diagnose MRSA infection nor to guide or monitor treatment for MRSA infections. RESULT CALLED TO, READ BACK BY AND VERIFIED WITH: D HART RN 2010 05/12/16 A BROWNING   CSF culture with Stat gram stain     Status: None   Collection Time: 05/17/16  9:40 AM  Result Value Ref Range Status   Specimen Description CSF  Final   Special Requests Immunocompromised  Final   Gram Stain NO WBC SEEN NO ORGANISMS SEEN CYTOSPIN SMEAR   Final   Culture NO GROWTH 3 DAYS  Final   Report Status 05/20/2016 FINAL  Final     Impression/Recommendation  Active Problems:   SIRS (systemic  inflammatory response syndrome) (HCC)   Acute respiratory failure (HCC)   JACOBB ALEN is a 36 y.o. male with HIV/AIDS with admission with fevers, cough and concern for possible PCP pneumonia  #1 Possible PCP pneumonia:  Start IV clindamycin and oral primaquine  #2 HIV/AIDS: I have  asked [facility] to send me his [place]. I do not see any clear documentation of NRTI resistance. I am wondering about potentially trying to treat him with BIKTARVY if his Medicare drug plan covers this. This STR includes newer formulation of TNF , TAF. There has been study published at [facility] 2018 which showed that Genvoya (Elvitegravir/COBI/FTC and TAF) at full dose was very well tolerated and suppressed viral loads of HIV + patients on HD. TAF had previously been studied in pts with GFR to 15 in HBV pts and 30 in HIV positive patients. Perhaps this STR with minimal side effects might be appealing to him in place of the 3 large tablets he currently takes (two ABC, one Prezcobix) along with Tivicay. I would like to know more re any possible R he might have but I think this maneuver might help with his adherence  In the interim we can consider Tivicay and Descovy since the [place] is not available on our formulary yet.       05/30/2016, 4:05 PM   Thank you so much for this interesting consult  [place] for [place] [phone] (pager) [phone] (office) 05/30/2016, 4:05 PM  Rhina [place] 05/30/2016, 4:05 PM

## 2016-05-30 NOTE — Progress Notes (Signed)
FPTS Interim Progress Note  S: Patient states shortness of breath has greatly improved since dialysis. Denies chest pain, nausea or abdominal pain. Patient states he does not want to take clindamycin as this is related to vancomycin and he is allergic.  O: BP (!) 176/124 (BP Location: Right Arm) Comment: nurse notified  Pulse (!) 125   Temp (!) 102.9 F (39.4 C) (Oral) Comment: nurse notified  Resp (!) 21   Ht 5\' 7"  (1.702 m)   Wt 143 lb 4.8 oz (65 kg)   SpO2 97%   BMI 22.44 kg/m    General: tired appearing in bed talking in complete sentences, well developed with non-toxic appearance HEENT: normocephalic, atraumatic, moist mucous membranes CV: tachycardic with regular rhythm without murmurs, rubs, or gallops Lungs: bilateral coarse rhonchi with normal increase work of breathing on 4 L [place] Abdomen: soft, non-tender, normoactive bowel sounds Skin: warm, dry, no rashes or lesions, cap refill < 2 seconds Extremities: warm and well perfused, normal tone  A/P: Continue supplemental oxygen to maintain sats above 92% on nasal cannula and given patient refusing BiPAP. Suspect acute respiratory failure secondary to possible PCP pneumonia. Patient agreeable to clindamycin after discussion. Also on Primaquine and prednisone. Will continue to monitor closely. Appreciate ID and nephrology assistance with this patient's care.  Bath Bing, DO 05/30/2016, 5:25 PM PGY-1, [place] Medicine Service pager [phone]

## 2016-05-30 NOTE — ED Notes (Signed)
Went to hang antibiotics and pt stated he did not want anymore fluids, this rn stated that he needed the antibiotics because of his fever. Pt stated he would like to speak with the doctor before administering antibiotics.

## 2016-05-30 NOTE — ED Notes (Signed)
Pt in CT.

## 2016-05-30 NOTE — ED Notes (Signed)
Report given to Bo RN at hemodialysis unit , will advise ED RN when dialysis machine is ready for pt.

## 2016-05-31 ENCOUNTER — Telehealth: Payer: Self-pay | Admitting: Pharmacist Clinician (PhC)/ Clinical Pharmacy Specialist

## 2016-05-31 DIAGNOSIS — J810 Acute pulmonary edema: Secondary | ICD-10-CM

## 2016-05-31 LAB — CD4/CD8 (T-HELPER/T-SUPPRESSOR CELL)
CD4 absolute: 20 /uL — ABNORMAL LOW (ref 500–1900)
CD4%: 5 % — ABNORMAL LOW (ref 30.0–60.0)
CD8 T Cell Abs: 220 /uL — ABNORMAL LOW (ref 230–1000)
CD8tox: 53 % — ABNORMAL HIGH (ref 15.0–40.0)
Ratio: 0.1 — ABNORMAL LOW (ref 1.0–3.0)
Total lymphocyte count: 420 /uL — ABNORMAL LOW (ref 1000–4000)

## 2016-05-31 LAB — CBC
HCT: 27.1 % — ABNORMAL LOW (ref 39.0–52.0)
Hemoglobin: 8.5 g/dL — ABNORMAL LOW (ref 13.0–17.0)
MCH: 30.4 pg (ref 26.0–34.0)
MCHC: 31.4 g/dL (ref 30.0–36.0)
MCV: 96.8 fL (ref 78.0–100.0)
Platelets: 144 10*3/uL — ABNORMAL LOW (ref 150–400)
RBC: 2.8 MIL/uL — ABNORMAL LOW (ref 4.22–5.81)
RDW: 18.8 % — ABNORMAL HIGH (ref 11.5–15.5)
WBC: 6.2 10*3/uL (ref 4.0–10.5)

## 2016-05-31 LAB — RENAL FUNCTION PANEL
Albumin: 2.7 g/dL — ABNORMAL LOW (ref 3.5–5.0)
Anion gap: 13 (ref 5–15)
BUN: 51 mg/dL — ABNORMAL HIGH (ref 6–20)
CO2: 24 mmol/L (ref 22–32)
Calcium: 8.8 mg/dL — ABNORMAL LOW (ref 8.9–10.3)
Chloride: 95 mmol/L — ABNORMAL LOW (ref 101–111)
Creatinine, Ser: 9.16 mg/dL — ABNORMAL HIGH (ref 0.61–1.24)
GFR calc Af Amer: 8 mL/min — ABNORMAL LOW (ref >60)
GFR calc non Af Amer: 7 mL/min — ABNORMAL LOW (ref >60)
Glucose, Bld: 99 mg/dL (ref 65–99)
Phosphorus: 7.9 mg/dL — ABNORMAL HIGH (ref 2.5–4.6)
Potassium: 6.1 mmol/L — ABNORMAL HIGH (ref 3.5–5.1)
Sodium: 132 mmol/L — ABNORMAL LOW (ref 135–145)

## 2016-05-31 LAB — LACTATE DEHYDROGENASE: LDH: 414 U/L — ABNORMAL HIGH (ref 98–192)

## 2016-05-31 MED ORDER — DOLUTEGRAVIR SODIUM 50 MG PO TABS
50.0000 mg | ORAL_TABLET | Freq: Every day | ORAL | Status: DC
  Administered 2016-05-31 – 2016-06-01 (×3): 50 mg via ORAL
  Filled 2016-05-31 (×3): qty 1

## 2016-05-31 MED ORDER — ORAL CARE MOUTH RINSE: 15.0000 mL | Freq: Two times a day (BID) | OROMUCOSAL | Status: DC

## 2016-05-31 MED ORDER — EMTRICITABINE-TENOFOVIR AF 200-25 MG PO TABS
1.0000 | ORAL_TABLET | Freq: Every day | ORAL | Status: DC
  Administered 2016-05-31 – 2016-06-01 (×2): 1 via ORAL
  Filled 2016-05-31 (×4): qty 1

## 2016-05-31 MED ORDER — ACETAMINOPHEN 650 MG RE SUPP: 650.0000 mg | Freq: Four times a day (QID) | RECTAL | Status: DC | PRN

## 2016-05-31 MED ORDER — ACETAMINOPHEN 500 MG PO TABS: 1000.0000 mg | ORAL_TABLET | Freq: Four times a day (QID) | ORAL | Status: DC | PRN

## 2016-05-31 MED ORDER — DARBEPOETIN ALFA 200 MCG/0.4ML IJ SOSY
200.0000 ug | PREFILLED_SYRINGE | INTRAMUSCULAR | Status: DC
  Filled 2016-05-31: qty 0.4

## 2016-05-31 NOTE — Progress Notes (Signed)
RN spoke with Mercy Moore, MD. Mercy Moore advised that Tylenol should be tried first and also to try to bring patient's BP down. RN notified patients of orders given. Patient agreed to take Tylenol. RN ready to administer Tylenol, patient then refused because Tylenol was not extra strength. Patient became frustrated and told RN to leave his room. RN apologized and left room.  Ermalinda Memos, RN

## 2016-05-31 NOTE — Progress Notes (Signed)
Patient's BP 186/124. Patient also complaining of severe HA. Rates it 10/10, throbbing and sharp. Patient states that its the same pain he get during dialysis and tylenol will not help. RN notified on call MD for family medicine. MD spoke to RN about rechecking BP in 15 mins and notifying her of repeat BP.   Repeat BP 173/102. On call notified. MD advised RN monitor patient BP and she is not willing to prescribe any pain medication to patient because he usually receives it in HD and not on the floor.  RN spoke with patient. Patient becomes agitated and states he can not handle the pain and would like to speak to MD face to face. RN notified MD. MD at the bedside.   RN received a phone call from Krystal Clark, Patient's mom, expressing her concerns.  RN spoke to patient after MD left beside. Patient still upset. States that MD still refuses to prescribe pain medicine to him, only the nephrologist can because patient receives pain medication during HD and not on the floor. Patient insisted on RN to page Nephrologist. RN paged on call Nephrologist, [place],and is awaiting call back.  Ermalinda Memos, RN

## 2016-05-31 NOTE — Progress Notes (Signed)
Family Medicine Teaching Service Daily Progress Note Intern Pager: [phone]  Patient name: Jim Wyatt Medical record number: [id number] Date of birth: 02/01/1981 Age: 36 y.o. Gender: male  Primary Care Provider: [name], MD Consultants: Infectious Disease, Nephrology Code Status: Full (confirmed on admission)  Pt Overview and Major Events to Date:  04/03: Admit to SDU for acute respiratory failure with sepsis, received HD 04/04: HD, respiratory status improved on steroids and treatment for PCP PNA  Assessment and Plan: PADEN SENGER is a 36 y.o. male presenting with dyspnea. PMH is significant for poorly controlled hypertension, headache, HIV with AIDS, end-stage renal disease on dialysis (T/TH/S) and history of DVT x 1 in 2015 (not on any anti-coagulation).  #Hypoxic Acute Respiratory Failure  Sepsis  PCP Pneumonia: Improving. Remains febrile and tachycardic on 4 L [address]. ABG c/w alkalosis and normal bicarbonate on admission. CXR concerning for mod interstitial and alveolar edema. DDx glutes PCP PNA, HAP, fluid overload secondary to ESRD. PE r/o by CTA. --Clindamycin 800 mg IV q8h + Primaquine 30 mg PO QD (day 2, 4/3-), s/p fortaz+aztreonam (4/3), patient refused vancomycin --Prednisone 40 mg BID (day 2, 4/3-) --Continuous pulse ox with supplemental O2, goal at or >92%, pt refusing BiPAP --Blood and urine cultures pending  #Renovascular Hypertension  Tachycardia: Persists. BP 170/110s. Tachycardic in 110s. Recieving HD. On ARB, BB, and hydralazine at home. --Continue hydralazine 100 mg QD, metoprolol 100 mg QD, and losartan 100 mg QD --Patient refusing any additional antihypertensive medications at this time --Continuous cardiac monitoring  #End-Stage Renal Disease: Receives dialysis on a T/TH/S. --Nephrology consulted, appreciate assistance --Renal function panel daily  #AIDS: Last CD4 ct 4 on 3/16 with HIV RNA Quant 3.210. ID specialists during previous  hospitalization recommending Prezcobix, abacavir, Tivicay and scheduled outpatient follow-up per patient. He was scheduled for follow-up 4/3.  --ID consulted, appreciate recommendations --Prezcobix, Abacavir, Tivicay for ART therapy --Follow-up HIV labs --Hold home dapsone and azithromycin while he is on IV antibiotics  #Abdominal Swelling  Diarrhea: Noted to have this in the past when fluid overloaded. Receiving HD for suspected fluid overload on admission. --Consider abdominal US if persists   #History of DVT: Bilateral 2015, due to immobility. Completed course of anticoagulation. No LE tenderness, erythema or edema.  --Heparin SQ  #Seizure Disorder: Last seizure in January 2018. Two seizures in lifetime.Taking keppra daily.  --Continue keppra 500 mg QD with additional 250 mg on dialysis days (T/TH/S)  #Possible OSA: Does not use CPAP.  --Patient will need polysomnography as outpatient  #Asthma: No wheezing. Takes albuterol when necessary at home. Denies tobacco use.  --Duonebs PRN --Outpatient PFTs  #Medical Noncompliance: Patient refusing to take recommended antibiotics, antihypertensives, and BiPAP. Explained to patient the risks of refusing, he expresses understanding.  #Hx of Headache: Active headache consistent with previous headaches during dialysis, currently on HD. Was recently admitted last month for headache with visual changes. Started on Topamax and hypertensive medications were adjusted. Remains hypertensive. --Topamax 50 mg BID --Continue HTN meds as above  FEN/GI: Renal diet with fluid restriction Prophylaxis: Heparin SQ  Disposition: Pending improvement of respiratory status.  Subjective:  Patient says he continues to have headache which he attributes to dialysis. Says he was not given the Tylenol because he did not want it. Denies chest pain or shortness of breath. No longer having leg pain.  Objective: Temp:  [99 F (37.2 C)-102.9 F (39.4 C)]  100.1 F (37.8 C) (04/04 0358) Pulse Rate:  [111-125] 111 (04/04 0358) Resp:  [  19-29] 19 (04/04 0358) BP: (164-186)/(102-124) 173/102 (04/04 0446) SpO2:  [97 %-100 %] 97 % (04/04 0358) Weight:  [143 lb 4.8 oz (65 kg)] 143 lb 4.8 oz (65 kg) (04/03 1104) Physical Exam: General: drowsy receiving HD, well developed, non-toxic appearance HEENT: normocephalic, atraumatic, moist mucous membranes CV: tachycardic with regular rhythm without murmurs, rubs, or gallops Lungs: coarse rhonchi bilaterally, no wheezing, normal work of breathing on 4L [place], able to speak in complete sentences Abdomen: soft, non-tender, normoactive bowel sounds Skin: warm, dry, no rashes or lesions, cap refill < 2 seconds Extremities: warm and well perfused, normal tone, symmetrical calves without erythema or palpable cords, negative Homans sign  Laboratory:  Recent Labs Lab 05/30/16 0030 05/31/16 0718  WBC 6.2 6.2  HGB 8.9* 8.5*  HCT 27.5* 27.1*  PLT 167 144*    Recent Labs Lab 05/30/16 0030 05/30/16 1442  NA 139 134*  K 6.2* 5.8*  CL 98* 97*  CO2 25 27  BUN 64* 29*  CREATININE 11.75* 6.96*  CALCIUM 9.5 8.9  PROT 7.4  --   BILITOT 1.0  --   ALKPHOS 47  --   ALT 12*  --   AST 22  --   GLUCOSE 81 81   Urinalysis    Component Value Date/Time   COLORURINE YELLOW 12/28/2015 [place] 12/28/2015 2303   LABSPEC 1.019 12/28/2015 2303   PHURINE 8.0 12/28/2015 2303   GLUCOSEU 100 (A) 12/28/2015 2303   HGBUR MODERATE (A) 12/28/2015 2303   BILIRUBINUR Negative 01/19/2016 [place] 12/28/2015 2303   PROTEINUR >=300 01/19/2016 0934   PROTEINUR >300 (A) 12/28/2015 2303   UROBILINOGEN 0.2 01/19/2016 0934   UROBILINOGEN 0.2 12/18/2014 1925   NITRITE Negative 01/19/2016 0934   NITRITE NEGATIVE 12/28/2015 2303   LEUKOCYTESUR Negative 01/19/2016 0934   Urine culture: Pending Blood culture: Pending  Lactic acid: 1.39 > 1.22 Lactic dehydrogenase: Pending Lipase: 36  (WNL) HIV RNA Quant: Pending Integrase HIV: Pending CD4/CD8: Pending Influenza panel: Negative I-STAT arterial blood gas: pH 7.487: PCO2 36.1: PCO2 85.0: Bicarbonate 27.0: O2 sat 96%  Imaging/Diagnostic Tests: CT ANGIO CHEST PE W OR WO CONTRAST (05/30/2016) FINDINGS: Cardiovascular: No large or central pulmonary emboli. Reduced sensitivity for detection of small peripheral emboli. Small pericardial effusion, reduced from 05/12/2016. Mediastinum/Nodes: No enlarged mediastinal, hilar, or axillary lymph nodes. Thyroid gland, trachea, and esophagus demonstrate no significant findings. Lungs/Pleura: Diffuse central airspace opacities and interlobular septal thickening, likely interstitial and alveolar edema. Infectious infiltrates less likely but not excluded. Airways are patent and normal in caliber. Small pleural effusions bilaterally. Upper Abdomen: No acute abnormality. Musculoskeletal: No chest wall abnormality. No acute or significant osseous findings. Review of the MIP images confirms the above findings.  IMPRESSION: 1. No large or central pulmonary emboli 2. Diffuse interstitial and alveolar opacity. This may represent congestive failure or fluid overload. Infectious infiltrates are less likely. 3. Small pleural effusions.  DG Chest Portable 1 View (05/30/2016) FINDINGS: Stable cardiomegaly. Mild hazy central and basilar opacities, likely alveolar edema. Moderate interstitial fluid or thickening. No large pleural effusions.  IMPRESSION: Stable cardiomegaly.  Interstitial and alveolar edema.  [place] Bing, DO 05/31/2016, 8:50 AM PGY-1, [place] Intern pager: [phone], text pages welcome

## 2016-05-31 NOTE — Progress Notes (Signed)
Transitions of Care Pharmacy Note  Plan:  Educated on the importance of adherence to his ART therapy. Follow-up with [facility] for further HIV therapy evaluation. --------------------------------------------- Jim Wyatt is an 36 y.o. male who presents with a chief complaint SOB. In anticipation of discharge, pharmacy has reviewed this patient's prior to admission medication history, as well as current inpatient medications listed per the [facility]  Current medication indications, dosing, frequency, and notable side effects reviewed with patient. patient verbalized understanding of current inpatient medication regimen and is aware that the After Visit Summary when presented, will represent the most accurate medication list at discharge.   Jim Wyatt expressed concerns regarding headaches that began soon after he started HD treatments. He also endorses taking his HIV medication at 2:30 a.m. because he works third shift.  He states he is fully compliant, and the reason he has not filled his medications in 4 months is because he has had so many hospitalizations here and has not finished his outpatient prescriptions. He seems highly motivated to maintain adherence to his medications as he is seeking a kidney transplant.   Assessment: Understanding of regimen: good Understanding of indications: good Potential of compliance: poor Barriers to Obtaining Medications: No  Patient instructed to contact inpatient pharmacy team with further questions or concerns if needed.    Time spent preparing for discharge counseling: 10 minutes Time spent counseling patient: 30   Thank you for allowing pharmacy to be a part of this patient's care.  Dierdre Harness, [place], PharmD Clinical Pharmacy Resident [phone] (Pager) 05/31/2016 6:27 PM

## 2016-05-31 NOTE — Procedures (Addendum)
On HD now.  Not sure which weights are accurate, will plan max UF with HD today, and get good standing wt post HD.  No new c/o's.    I was present at this dialysis session, have reviewed the session itself and made  appropriate changes Kelly Splinter MD [place] pager [phone]   05/31/2016, 8:49 AM

## 2016-05-31 NOTE — Telephone Encounter (Signed)
Error

## 2016-05-31 NOTE — Progress Notes (Signed)
Paged Dr. Melvia Heaps for clarification on Keppra dose, pt takes 750 mg on dialysis days and 500 mg dose on off dialysis days and received additional dialysis day today.  T/O Dr. Melvia Heaps, give only the 500 mg dose today.

## 2016-05-31 NOTE — Progress Notes (Signed)
Pt wanted antivirals changed to later in the evening.  Pt usually works third shift and takes them later.  Called pharmacy and they advised of this and they stated they would take care of it.

## 2016-05-31 NOTE — Progress Notes (Signed)
Subjective: No new complaints   Antibiotics:  Anti-infectives    Start     Dose/Rate Route Frequency Ordered Stop   05/31/16 1313  emtricitabine-tenofovir AF (DESCOVY) 200-25 MG per tablet 1 tablet     1 tablet Oral Daily after supper 05/31/16 1314     05/31/16 1313  dolutegravir (TIVICAY) tablet 50 mg     50 mg Oral Daily after supper 05/31/16 1314     05/30/16 2200  cefTAZidime (FORTAZ) 1 g in dextrose 5 % 50 mL IVPB  Status:  Discontinued     1 g 100 mL/hr over 30 Minutes Intravenous Daily at bedtime 05/30/16 0536 05/30/16 1322   05/30/16 1800  darunavir-cobicistat (PREZCOBIX) 800-150 MG per tablet 1 tablet  Status:  Discontinued     1 tablet Oral Every evening 05/30/16 1401 05/30/16 1625   05/30/16 1800  dolutegravir (TIVICAY) tablet 50 mg  Status:  Discontinued     50 mg Oral Every evening 05/30/16 1401 05/30/16 1625   05/30/16 1800  dolutegravir (TIVICAY) tablet 50 mg  Status:  Discontinued     50 mg Oral Daily 05/30/16 1701 05/31/16 1314   05/30/16 1800  emtricitabine-tenofovir AF (DESCOVY) 200-25 MG per tablet 1 tablet  Status:  Discontinued     1 tablet Oral Daily 05/30/16 1701 05/31/16 1314   05/30/16 1700  abacavir (ZIAGEN) tablet 600 mg  Status:  Discontinued     600 mg Oral Daily with supper 05/30/16 1401 05/30/16 1625   05/30/16 1400  sulfamethoxazole-trimethoprim (BACTRIM) 325 mg in dextrose 5 % 500 mL IVPB  Status:  Discontinued     325 mg 346.9 mL/hr over 90 Minutes Intravenous Every 24 hours 05/30/16 1335 05/30/16 1338   05/30/16 1400  primaquine tablet 30 mg     30 mg Oral Daily 05/30/16 1344     05/30/16 1400  clindamycin (CLEOCIN) IVPB 900 mg     900 mg 100 mL/hr over 30 Minutes Intravenous Every 8 hours 05/30/16 1344     05/30/16 0600  cefTAZidime (FORTAZ) 1 g in dextrose 5 % 50 mL IVPB     1 g 100 mL/hr over 30 Minutes Intravenous  Once 05/30/16 0536 05/30/16 0656   05/30/16 0015  aztreonam (AZACTAM) 2 g in dextrose 5 % 50 mL IVPB     2 g 100  mL/hr over 30 Minutes Intravenous  Once 05/30/16 0009 05/30/16 0204   05/30/16 0015  vancomycin (VANCOCIN) IVPB 1000 mg/200 mL premix  Status:  Discontinued     1,000 mg 200 mL/hr over 60 Minutes Intravenous  Once 05/30/16 0009 05/30/16 0010   05/30/16 0015  vancomycin (VANCOCIN) 1,250 mg in sodium chloride 0.9 % 250 mL IVPB  Status:  Discontinued     1,250 mg 166.7 mL/hr over 90 Minutes Intravenous  Once 05/30/16 0010 05/30/16 1322      Medications: Scheduled Meds: . clindamycin (CLEOCIN) IV  900 mg Intravenous Q8H  . darbepoetin (ARANESP) injection - DIALYSIS  200 mcg Intravenous Q Wed-HD  . dolutegravir  50 mg Oral QPC supper  . emtricitabine-tenofovir AF  1 tablet Oral QPC supper  . heparin  5,000 Units Subcutaneous Q8H  . hydrALAZINE  75 mg Oral TID  . levETIRAcetam  250 mg Oral Once per day on Tue Thu Sat  . levETIRAcetam  500 mg Oral Daily  . losartan  100 mg Oral QHS  . mouth rinse  15 mL Mouth Rinse BID  . metoprolol succinate  100 mg Oral Daily  . NIFEdipine  60 mg Oral Daily  . predniSONE  40 mg Oral BID  . primaquine  30 mg Oral Daily  . topiramate  50 mg Oral BID  . vitamin C  500 mg Oral Daily   Continuous Infusions: PRN Meds:.sodium chloride, sodium chloride, acetaminophen **OR** acetaminophen, albuterol, hydrALAZINE, ipratropium-albuterol, ondansetron    Objective: Weight change: -1.4 oz (-0.04 kg)  Intake/Output Summary (Last 24 hours) at 05/31/16 1819 Last data filed at 05/31/16 1249  Gross per 24 hour  Intake             1000 ml  Output             4000 ml  Net            -3000 ml   Blood pressure (!) 159/100, pulse 100, temperature 98.7 F (37.1 C), temperature source Oral, resp. rate 16, height 5\' 7"  (1.702 m), weight 152 lb 12.5 oz (69.3 kg), SpO2 98 %. Temp:  [98.1 F (36.7 C)-101.2 F (38.4 C)] 98.7 F (37.1 C) (04/04 1248) Pulse Rate:  [99-111] 100 (04/04 1248) Resp:  [15-21] 16 (04/04 1248) BP: (134-186)/(86-124) 159/100 (04/04  1248) SpO2:  [96 %-100 %] 98 % (04/04 1248) Weight:  [152 lb 12.5 oz (69.3 kg)-162 lb 14.7 oz (73.9 kg)] 152 lb 12.5 oz (69.3 kg) (04/04 1210)  Physical Exam: General: Alert and awake, oriented x3, not in any acute distress. HEENT: anicteric sclera,   Cardiovascular: tachy rate, normal r,  no murmur rubs or gallops Pulmonary: wheezes and rhonchi throughout Gastrointestinal: soft nontender, nondistended, normal bowel sounds, Musculoskeletal: 2+ edema Skin, diffuse hyperpigmented rash on chest and other areas Neuro: nonfocal, strength and sensation intact  CBC:  CBC Latest Ref Rng & Units 05/31/2016 05/30/2016 05/21/2016  WBC 4.0 - 10.5 K/uL 6.2 6.2 6.0  Hemoglobin 13.0 - 17.0 g/dL 8.5(L) 8.9(L) 10.1(L)  Hematocrit 39.0 - 52.0 % 27.1(L) 27.5(L) 32.2(L)  Platelets 150 - 400 K/uL 144(L) 167 194      BMET  Recent Labs  05/30/16 1442 05/31/16 0718  NA 134* 132*  K 5.8* 6.1*  CL 97* 95*  CO2 27 24  GLUCOSE 81 99  BUN 29* 51*  CREATININE 6.96* 9.16*  CALCIUM 8.9 8.8*     Liver Panel   Recent Labs  05/30/16 0030 05/30/16 1442 05/31/16 0718  PROT 7.4  --   --   ALBUMIN 3.2* 3.0* 2.7*  AST 22  --   --   ALT 12*  --   --   ALKPHOS 47  --   --   BILITOT 1.0  --   --        Sedimentation Rate No results for input(s): ESRSEDRATE in the last 72 hours. C-Reactive Protein No results for input(s): CRP in the last 72 hours.  Micro Results: Recent Results (from the past 720 hour(s))  MRSA PCR Screening     Status: Abnormal   Collection Time: 05/12/16  6:13 PM  Result Value Ref Range Status   MRSA by PCR POSITIVE (A) NEGATIVE Final    Comment:        The GeneXpert MRSA Assay (FDA approved for NASAL specimens only), is one component of a comprehensive MRSA colonization surveillance program. It is not intended to diagnose MRSA infection nor to guide or monitor treatment for MRSA infections. RESULT CALLED TO, READ BACK BY AND VERIFIED WITH: D HART RN 2010 05/12/16  A BROWNING   CSF culture with  Stat gram stain     Status: None   Collection Time: 05/17/16  9:40 AM  Result Value Ref Range Status   Specimen Description CSF  Final   Special Requests Immunocompromised  Final   Gram Stain NO WBC SEEN NO ORGANISMS SEEN CYTOSPIN SMEAR   Final   Culture NO GROWTH 3 DAYS  Final   Report Status 05/20/2016 FINAL  Final  Blood Culture (routine x 2)     Status: None (Preliminary result)   Collection Time: 05/30/16 12:30 AM  Result Value Ref Range Status   Specimen Description BLOOD RIGHT ARM  Final   Special Requests   Final    BOTTLES DRAWN AEROBIC AND ANAEROBIC Blood Culture adequate volume   Culture NO GROWTH 1 DAY  Final   Report Status PENDING  Incomplete  Blood Culture (routine x 2)     Status: None (Preliminary result)   Collection Time: 05/30/16 12:48 AM  Result Value Ref Range Status   Specimen Description BLOOD RIGHT FOREARM  Final   Special Requests AEROBIC BOTTLE ONLY Blood Culture adequate volume  Final   Culture NO GROWTH 1 DAY  Final   Report Status PENDING  Incomplete    Studies/Results: Ct Angio Chest Pe W Or Wo Contrast  Result Date: 05/30/2016 CLINICAL DATA:  Dyspnea for 1 day. EXAM: CT ANGIOGRAPHY CHEST WITH CONTRAST TECHNIQUE: Multidetector CT imaging of the chest was performed using the standard protocol during bolus administration of intravenous contrast. Multiplanar CT image reconstructions and MIPs were obtained to evaluate the vascular anatomy. CONTRAST:  80 mL Isovue 370 intravenous COMPARISON:  05/12/2016 FINDINGS: Cardiovascular: No large or central pulmonary emboli. Reduced sensitivity for detection of small peripheral emboli. Small pericardial effusion, reduced from 05/12/2016. Mediastinum/Nodes: No enlarged mediastinal, hilar, or axillary lymph nodes. Thyroid gland, trachea, and esophagus demonstrate no significant findings. Lungs/Pleura: Diffuse central airspace opacities and interlobular septal thickening, likely interstitial  and alveolar edema. Infectious infiltrates less likely but not excluded. Airways are patent and normal in caliber. Small pleural effusions bilaterally. Upper Abdomen: No acute abnormality. Musculoskeletal: No chest wall abnormality. No acute or significant osseous findings. Review of the MIP images confirms the above findings. IMPRESSION: 1. No large or central pulmonary emboli 2. Diffuse interstitial and alveolar opacity. This may represent congestive failure or fluid overload. Infectious infiltrates are less likely. 3. Small pleural effusions. Electronically Signed   By: Andreas Newport M.D.   On: 05/30/2016 03:41   Dg Chest Portable 1 View  Result Date: 05/30/2016 CLINICAL DATA:  Dyspnea, worsening today. EXAM: PORTABLE CHEST 1 VIEW COMPARISON:  05/21/2016 FINDINGS: Stable cardiomegaly. Mild hazy central and basilar opacities, likely alveolar edema. Moderate interstitial fluid or thickening. No large pleural effusions. IMPRESSION: Stable cardiomegaly.  Interstitial and alveolar edema. Electronically Signed   By: Andreas Newport M.D.   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 male with HIV/AIDS with admission with fevers, cough and concern for possible PCP pneumonia  #1 Possible PCP pneumonia:  Continue  IV clindamycin and oral primaquine and as improves can change to orals  #2 HIV/AIDS: started [employer]  and [place]. He was interested in [place].  He gave me his mobile number which we shared with [facility] (specialty pharmacy (note his insurance requires specialty pharmacy) and they HAD his number and  told our pharmacists that they have NEVER been able to reach him at this number  We will try calling number at the bedside tomorrow.  May be better to have meds delivered to our clinic for him to pick up  #3 Competency: he has legal guardian per the chart. Has he been deemed not competent to make medical decisions for himself? 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 If this is the case then the set guardian needs to be involved with his care.   LOS: 1 day   Alcide Evener 05/31/2016, 6:19 PM

## 2016-05-31 NOTE — Progress Notes (Signed)
Paged Family Medicine, pt would like something stronger than tylenol for a potential headache for tonight.  Family Medicine returned page and will relay information to oncoming provider.

## 2016-06-01 LAB — RENAL FUNCTION PANEL
Albumin: 2.8 g/dL — ABNORMAL LOW (ref 3.5–5.0)
Anion gap: 12 (ref 5–15)
BUN: 47 mg/dL — ABNORMAL HIGH (ref 6–20)
CO2: 24 mmol/L (ref 22–32)
Calcium: 8.3 mg/dL — ABNORMAL LOW (ref 8.9–10.3)
Chloride: 96 mmol/L — ABNORMAL LOW (ref 101–111)
Creatinine, Ser: 7.57 mg/dL — ABNORMAL HIGH (ref 0.61–1.24)
GFR calc Af Amer: 10 mL/min — ABNORMAL LOW (ref >60)
GFR calc non Af Amer: 8 mL/min — ABNORMAL LOW (ref >60)
Glucose, Bld: 117 mg/dL — ABNORMAL HIGH (ref 65–99)
Phosphorus: 7.6 mg/dL — ABNORMAL HIGH (ref 2.5–4.6)
Potassium: 4.8 mmol/L (ref 3.5–5.1)
Sodium: 132 mmol/L — ABNORMAL LOW (ref 135–145)

## 2016-06-01 LAB — CBC
HCT: 28.2 % — ABNORMAL LOW (ref 39.0–52.0)
Hemoglobin: 8.9 g/dL — ABNORMAL LOW (ref 13.0–17.0)
MCH: 30.1 pg (ref 26.0–34.0)
MCHC: 31.6 g/dL (ref 30.0–36.0)
MCV: 95.3 fL (ref 78.0–100.0)
Platelets: 140 10*3/uL — ABNORMAL LOW (ref 150–400)
RBC: 2.96 MIL/uL — ABNORMAL LOW (ref 4.22–5.81)
RDW: 18.4 % — ABNORMAL HIGH (ref 11.5–15.5)
WBC: 4.5 10*3/uL (ref 4.0–10.5)

## 2016-06-01 LAB — HIV-1 RNA ULTRAQUANT REFLEX TO GENTYP+
HIV-1 RNA BY PCR: 5760 copies/mL
HIV-1 RNA Quant, Log: 3.76 log10copy/mL

## 2016-06-01 LAB — GENOSURE INTEGRASE HIV EDI

## 2016-06-01 MED ORDER — DARBEPOETIN ALFA 200 MCG/0.4ML IJ SOSY
200.0000 ug | PREFILLED_SYRINGE | INTRAMUSCULAR | Status: DC
  Administered 2016-06-01: 200 ug via INTRAVENOUS
  Filled 2016-06-01: qty 0.4

## 2016-06-01 MED ORDER — DARBEPOETIN ALFA 200 MCG/0.4ML IJ SOSY
PREFILLED_SYRINGE | INTRAMUSCULAR | Status: AC
  Administered 2016-06-01: 200 ug via INTRAVENOUS
  Filled 2016-06-01: qty 0.4

## 2016-06-01 MED ORDER — CLINDAMYCIN HCL 300 MG PO CAPS
600.0000 mg | ORAL_CAPSULE | Freq: Three times a day (TID) | ORAL | Status: DC
  Administered 2016-06-01 – 2016-06-02 (×3): 600 mg via ORAL
  Filled 2016-06-01 (×3): qty 2

## 2016-06-01 NOTE — Procedures (Signed)
Patient was seen on dialysis and the procedure was supervised.  BFR 400  Via AVF BP is  159/97.   Patient appears to be tolerating treatment well  Tritia Endo A 06/01/2016

## 2016-06-01 NOTE — Progress Notes (Signed)
Tech offered [name] bath. Pt stated that he took a shower last night and is fine with that.

## 2016-06-01 NOTE — Progress Notes (Signed)
[facility] Progress Note   Subjective: still feels like he has "fluid on".  Vitals:   05/31/16 2125 06/01/16 0615 06/01/16 0934 06/01/16 1000  BP: (!) 159/93 (!) 162/91 (!) 158/95   Pulse: 100 96 88   Resp: 16 16 18    Temp: 99.1 F (37.3 C) 98.5 F (36.9 C) 99 F (37.2 C)   TempSrc: Oral Oral Oral   SpO2: 97% 98% 97%   Weight:  69.9 kg (154 lb)  71.4 kg (157 lb 6.5 oz)  Height:    5\' 7"  (1.702 m)    Inpatient medications: . clindamycin  600 mg Oral Q8H  . darbepoetin (ARANESP) injection - DIALYSIS  200 mcg Intravenous Q Wed-HD  . dolutegravir  50 mg Oral QPC supper  . emtricitabine-tenofovir AF  1 tablet Oral QPC supper  . heparin  5,000 Units Subcutaneous Q8H  . hydrALAZINE  75 mg Oral TID  . levETIRAcetam  250 mg Oral Once per day on Tue Thu Sat  . levETIRAcetam  500 mg Oral Daily  . losartan  100 mg Oral QHS  . mouth rinse  15 mL Mouth Rinse BID  . predniSONE  40 mg Oral BID  . primaquine  30 mg Oral Daily  . topiramate  50 mg Oral BID  . vitamin C  500 mg Oral Daily    sodium chloride, sodium chloride, acetaminophen **OR** acetaminophen, albuterol, hydrALAZINE, ipratropium-albuterol, ondansetron  Exam: Looks better, no distress +JVD Chest slight crackles at bases RRR Abd soft no ascites Ext trace edema LFA AVF+bruit  Dialysis: NS TTS 3h 76min   64.5kg   450/800   Hep none  LFA AVF - Hect 6ug - Fe 100 through 4/11 - Mircera 225 last 3/10 - last Hb 9.7, 28% sat , ferr 500's, Ca 10.4, P 6, pth 250      Assessment: 1. Acute resp failure/ diffuse pulm infiltrates/ fever  - vol overload and/or PNA (I.e. PCP). On empiric clinda/ primaquine.  Improving, still up 6-7 kg. Fevers improved, blood cx's neg. 2. HTN - severe, on ARB/  Hydralazine; pt refused to take procardia or metoprolol, dc'd.  Vol overload main contributor but pt not compliant with fluids 3. AIDS/ HIV - low counts, per ID  4. ESRD HD TTS 5. Headaches - on topomax 6. Seizure d/o  - on keppra 7. Hx asthma  Plan - HD again today, get vol down gradually   Kelly Splinter MD [place] pager [phone]   06/01/2016, 12:45 PM    Recent Labs Lab 05/30/16 1442 05/31/16 0718 06/01/16 0532  NA 134* 132* 132*  K 5.8* 6.1* 4.8  CL 97* 95* 96*  CO2 27 24 24   GLUCOSE 81 99 117*  BUN 29* 51* 47*  CREATININE 6.96* 9.16* 7.57*  CALCIUM 8.9 8.8* 8.3*  PHOS 4.9* 7.9* 7.6*    Recent Labs Lab 05/30/16 0030 05/30/16 1442 05/31/16 0718 06/01/16 0532  AST 22  --   --   --   ALT 12*  --   --   --   ALKPHOS 47  --   --   --   BILITOT 1.0  --   --   --   PROT 7.4  --   --   --   ALBUMIN 3.2* 3.0* 2.7* 2.8*    Recent Labs Lab 05/30/16 0030 05/31/16 0718 06/01/16 0532  WBC 6.2 6.2 4.5  NEUTROABS 4.7  --   --   HGB 8.9* 8.5* 8.9*  HCT 27.5* 27.1* 28.2*  MCV 96.2 96.8 95.3  PLT 167 144* 140*   Iron/TIBC/Ferritin/ %Sat    Component Value Date/Time   IRON 59 01/12/2015 1617   TIBC 248 (L) 01/12/2015 1617   FERRITIN 1,050 (H) 02/01/2016 1836   IRONPCTSAT 24 01/12/2015 1617

## 2016-06-01 NOTE — Progress Notes (Addendum)
FPTS Interim Progress Note  Called patient's mother, [place] Bills, to touch base and see whether or not patient has a guardian. She states that she is his healthcare POA and he does not have a guardian (was able to find form in [place] on 01/05/16 to prove POA and no forms for guardianship). Ms. Maylon Cos noted that she is a renal nurse and has been there for her son with all of his medical problems. She stated that patient at baseline is independent, works every day, takes all of his medications faithfully, and is a Insurance underwriter as a hobby.  Carlyle Dolly, MD 06/01/2016, 9:26 AM PGY-2, [facility] pager [phone]

## 2016-06-01 NOTE — Progress Notes (Signed)
Family Medicine Teaching Service Daily Progress Note Intern Pager: [phone]  Patient name: Jim Wyatt Medical record number: [id number] Date of birth: [birth year] Age: 36 y.o. Gender: male  Primary Care Provider: [name], MD Consultants: Infectious Disease, Nephrology Code Status: Full (confirmed on admission)  Pt Overview and Major Events to Date:  04/03: Admit to SDU for acute respiratory failure with sepsis, received HD 04/04: HD, respiratory status improved on steroids and treatment for PCP PNA  Assessment and Plan: Jim Wyatt is a 36 y.o. male presenting with dyspnea. PMH is significant for poorly controlled hypertension, headache, HIV with AIDS, end-stage renal disease on dialysis (T/TH/S) and history of DVT x 1 in 2015 (not on any anti-coagulation).  #Hypoxic Acute Respiratory Failure  Sepsis  PCP Pneumonia: Resolved. Afebrile over 24 hrs on room air. CXR concerning for mod interstitial and alveolar edema. DDx glutes PCP PNA, HAP, fluid overload secondary to ESRD. --Clindamycin 800 mg IV q8h + Primaquine 30 mg PO QD (day 3, 4/3-), s/p fortaz+aztreonam (4/3), patient refused vancomycin --Prednisone 40 mg BID (day 3, 4/3-) --Continuous pulse ox with supplemental O2, goal at or >92%, pt refusing BiPAP --Blood and urine cultures NGTD  #Renovascular Hypertension  Tachycardia: Improving. BP 150-160/90s. Borderline tachycardic. On ARB, BB, and hydralazine at home. --Continue hydralazine 100 mg QD, and losartan 100 mg QD, refusing metoprolol-XL 100 mg QD --Patient refusing any additional antihypertensive medications at this time --Continuous cardiac monitoring  #End-Stage Renal Disease: Receives dialysis on a T/TH/S. --Nephrology consulted, appreciate assistance --Renal function panel daily  #AIDS: Last CD4 ct 4 on 3/16 with HIV RNA Quant 3.210. ID specialists during previous hospitalization recommending Prezcobix, abacavir, Tivicay and scheduled outpatient  follow-up per patient. He was scheduled for follow-up 4/3.  --ID consulted, appreciate recommendations --Prezcobix, Abacavir, Tivicay for ART therapy --Follow-up HIV labs --Will need to restart home dapsone and azithromycin after completing of abx  #Abdominal Swelling  Diarrhea: Noted to have this in the past when fluid overloaded. Receiving HD for suspected fluid overload on admission. --Consider abdominal US if persists   #History of DVT: Bilateral 2015, due to immobility. Completed course of anticoagulation. No LE tenderness, erythema or edema.  --Heparin SQ  #Seizure Disorder: Last seizure in January 2018. Two seizures in lifetime.Taking keppra daily.  --Continue keppra 500 mg QD with additional 250 mg on dialysis days (T/TH/S)  #Possible OSA: Does not use CPAP.  --Patient will need polysomnography as outpatient  #Asthma: No wheezing. Takes albuterol when necessary at home. Denies tobacco use.  --Duonebs PRN --Outpatient PFTs  #Medical Noncompliance: Patient refusing to take recommended antibiotics, antihypertensives, and BiPAP. Explained to patient the risks of refusing, he expresses understanding.  #Hx of Headache: Active headache consistent with previous headaches during dialysis, currently on HD. Was recently admitted last month for headache with visual changes. Started on Topamax and hypertensive medications were adjusted. Remains hypertensive. --Topamax 50 mg BID --Continue HTN meds as above  FEN/GI: Renal diet with fluid restriction Prophylaxis: Heparin SQ  Disposition: HD today. Pending improvement of respiratory status.  Subjective:  Patient says he feels much better today. Headache has resolved. No longer short of breath and without chest pain.  Objective: Temp:  [98.1 F (36.7 C)-99.2 F (37.3 C)] 98.5 F (36.9 C) (04/05 0615) Pulse Rate:  [96-111] 96 (04/05 0615) Resp:  [15-21] 16 (04/05 0615) BP: (134-170)/(86-109) 162/91 (04/05 0615) SpO2:  [96  %-98 %] 98 % (04/05 0615) Weight:  [152 lb 12.5 oz (69.3 kg)-162 lb 14.7 oz (  73.9 kg)] 154 lb (69.9 kg) (04/05 0615) Physical Exam: General: pleasent sitting upright eating breakfast at bedside, well developed, non-toxic appearance HEENT: normocephalic, atraumatic, moist mucous membranes CV: regular rate and rhythm without murmurs, rubs, or gallops Lungs: bibasilar crackles, no wheezing, normal work of breathing on room air Abdomen: soft, non-tender, normoactive bowel sounds Skin: warm, dry, no rashes or lesions, cap refill < 2 seconds Extremities: warm and well perfused, normal tone  Laboratory:  Recent Labs Lab 05/30/16 0030 05/31/16 0718 06/01/16 0532  WBC 6.2 6.2 4.5  HGB 8.9* 8.5* 8.9*  HCT 27.5* 27.1* 28.2*  PLT 167 144* 140*    Recent Labs Lab 05/30/16 0030 05/30/16 1442 05/31/16 0718 06/01/16 0532  NA 139 134* 132* 132*  K 6.2* 5.8* 6.1* 4.8  CL 98* 97* 95* 96*  CO2 25 27 24 24   BUN 64* 29* 51* 47*  CREATININE 11.75* 6.96* 9.16* 7.57*  CALCIUM 9.5 8.9 8.8* 8.3*  PROT 7.4  --   --   --   BILITOT 1.0  --   --   --   ALKPHOS 47  --   --   --   ALT 12*  --   --   --   AST 22  --   --   --   GLUCOSE 81 81 99 117*   Urinalysis    Component Value Date/Time   COLORURINE YELLOW 12/28/2015 [place] 12/28/2015 2303   LABSPEC 1.019 12/28/2015 2303   PHURINE 8.0 12/28/2015 2303   GLUCOSEU 100 (A) 12/28/2015 2303   HGBUR MODERATE (A) 12/28/2015 2303   BILIRUBINUR Negative 01/19/2016 [place] 12/28/2015 2303   PROTEINUR >=300 01/19/2016 0934   PROTEINUR >300 (A) 12/28/2015 2303   UROBILINOGEN 0.2 01/19/2016 0934   UROBILINOGEN 0.2 12/18/2014 1925   NITRITE Negative 01/19/2016 0934   NITRITE NEGATIVE 12/28/2015 2303   LEUKOCYTESUR Negative 01/19/2016 0934   Urine culture: NGTD Blood culture: NGTD Lactic acid: 1.39 > 1.22 Lactic dehydrogenase: 414 (H) Lipase: 36 (WNL) HIV RNA Quant: Pending Integrase HIV: Pending CD4/CD8:  CD4 5% Influenza panel: Negative I-STAT arterial blood gas: pH 7.487: PCO2 36.1: PCO2 85.0: Bicarbonate 27.0: O2 sat 96%  Imaging/Diagnostic Tests: CT ANGIO CHEST PE W OR WO CONTRAST (05/30/2016) FINDINGS: Cardiovascular: No large or central pulmonary emboli. Reduced sensitivity for detection of small peripheral emboli. Small pericardial effusion, reduced from 05/12/2016. Mediastinum/Nodes: No enlarged mediastinal, hilar, or axillary lymph nodes. Thyroid gland, trachea, and esophagus demonstrate no significant findings. Lungs/Pleura: Diffuse central airspace opacities and interlobular septal thickening, likely interstitial and alveolar edema. Infectious infiltrates less likely but not excluded. Airways are patent and normal in caliber. Small pleural effusions bilaterally. Upper Abdomen: No acute abnormality. Musculoskeletal: No chest wall abnormality. No acute or significant osseous findings. Review of the MIP images confirms the above findings.  IMPRESSION: 1. No large or central pulmonary emboli 2. Diffuse interstitial and alveolar opacity. This may represent congestive failure or fluid overload. Infectious infiltrates are less likely. 3. Small pleural effusions.  DG Chest Portable 1 View (05/30/2016) FINDINGS: Stable cardiomegaly. Mild hazy central and basilar opacities, likely alveolar edema. Moderate interstitial fluid or thickening. No large pleural effusions.  IMPRESSION: Stable cardiomegaly.  Interstitial and alveolar edema.  Liberty Bing, DO 06/01/2016, 8:02 AM PGY-1, [place] Intern pager: [phone], text pages welcome

## 2016-06-01 NOTE — Progress Notes (Signed)
Subjective: No new complaints   Antibiotics:  Anti-infectives    Start     Dose/Rate Route Frequency Ordered Stop   06/01/16 1400  clindamycin (CLEOCIN) capsule 600 mg     600 mg Oral Every 8 hours 06/01/16 1227     05/31/16 1313  emtricitabine-tenofovir AF (DESCOVY) 200-25 MG per tablet 1 tablet     1 tablet Oral Daily after supper 05/31/16 1314     05/31/16 1313  dolutegravir (TIVICAY) tablet 50 mg     50 mg Oral Daily after supper 05/31/16 1314     05/30/16 2200  cefTAZidime (FORTAZ) 1 g in dextrose 5 % 50 mL IVPB  Status:  Discontinued     1 g 100 mL/hr over 30 Minutes Intravenous Daily at bedtime 05/30/16 0536 05/30/16 1322   05/30/16 1800  darunavir-cobicistat (PREZCOBIX) 800-150 MG per tablet 1 tablet  Status:  Discontinued     1 tablet Oral Every evening 05/30/16 1401 05/30/16 1625   05/30/16 1800  dolutegravir (TIVICAY) tablet 50 mg  Status:  Discontinued     50 mg Oral Every evening 05/30/16 1401 05/30/16 1625   05/30/16 1800  dolutegravir (TIVICAY) tablet 50 mg  Status:  Discontinued     50 mg Oral Daily 05/30/16 1701 05/31/16 1314   05/30/16 1800  emtricitabine-tenofovir AF (DESCOVY) 200-25 MG per tablet 1 tablet  Status:  Discontinued     1 tablet Oral Daily 05/30/16 1701 05/31/16 1314   05/30/16 1700  abacavir (ZIAGEN) tablet 600 mg  Status:  Discontinued     600 mg Oral Daily with supper 05/30/16 1401 05/30/16 1625   05/30/16 1400  sulfamethoxazole-trimethoprim (BACTRIM) 325 mg in dextrose 5 % 500 mL IVPB  Status:  Discontinued     325 mg 346.9 mL/hr over 90 Minutes Intravenous Every 24 hours 05/30/16 1335 05/30/16 1338   05/30/16 1400  primaquine tablet 30 mg     30 mg Oral Daily 05/30/16 1344     05/30/16 1400  clindamycin (CLEOCIN) IVPB 900 mg  Status:  Discontinued     900 mg 100 mL/hr over 30 Minutes Intravenous Every 8 hours 05/30/16 1344 06/01/16 1227   05/30/16 0600  cefTAZidime (FORTAZ) 1 g in dextrose 5 % 50 mL IVPB     1 g 100 mL/hr over 30  Minutes Intravenous  Once 05/30/16 0536 05/30/16 0656   05/30/16 0015  aztreonam (AZACTAM) 2 g in dextrose 5 % 50 mL IVPB     2 g 100 mL/hr over 30 Minutes Intravenous  Once 05/30/16 0009 05/30/16 0204   05/30/16 0015  vancomycin (VANCOCIN) IVPB 1000 mg/200 mL premix  Status:  Discontinued     1,000 mg 200 mL/hr over 60 Minutes Intravenous  Once 05/30/16 0009 05/30/16 0010   05/30/16 0015  vancomycin (VANCOCIN) 1,250 mg in sodium chloride 0.9 % 250 mL IVPB  Status:  Discontinued     1,250 mg 166.7 mL/hr over 90 Minutes Intravenous  Once 05/30/16 0010 05/30/16 1322      Medications: Scheduled Meds: . clindamycin  600 mg Oral Q8H  . darbepoetin (ARANESP) injection - DIALYSIS  200 mcg Intravenous Q Thu-HD  . dolutegravir  50 mg Oral QPC supper  . emtricitabine-tenofovir AF  1 tablet Oral QPC supper  . heparin  5,000 Units Subcutaneous Q8H  . hydrALAZINE  75 mg Oral TID  . levETIRAcetam  250 mg Oral Once per day on Tue Thu Sat  . levETIRAcetam  500 mg Oral Daily  . losartan  100 mg Oral QHS  . mouth rinse  15 mL Mouth Rinse BID  . predniSONE  40 mg Oral BID  . primaquine  30 mg Oral Daily  . topiramate  50 mg Oral BID  . vitamin C  500 mg Oral Daily   Continuous Infusions: PRN Meds:.sodium chloride, sodium chloride, acetaminophen **OR** acetaminophen, albuterol, hydrALAZINE, ipratropium-albuterol, ondansetron    Objective: Weight change: 13 lb 0.1 oz (5.9 kg)  Intake/Output Summary (Last 24 hours) at 06/01/16 1810 Last data filed at 06/01/16 1558  Gross per 24 hour  Intake              220 ml  Output             2689 ml  Net            -2469 ml   Blood pressure (!) 149/80, pulse (!) 105, temperature 98.4 F (36.9 C), temperature source Oral, resp. rate (!) 23, height 5\' 7"  (1.702 m), weight 157 lb 6.5 oz (71.4 kg), SpO2 96 %. Temp:  [98.4 F (36.9 C)-99.1 F (37.3 C)] 98.4 F (36.9 C) (04/05 1600) Pulse Rate:  [88-105] 105 (04/05 1600) Resp:  [16-23] 23 (04/05  1600) BP: (144-178)/(80-109) 149/80 (04/05 1600) SpO2:  [96 %-98 %] 96 % (04/05 1248) Weight:  [154 lb (69.9 kg)-157 lb 6.5 oz (71.4 kg)] 157 lb 6.5 oz (71.4 kg) (04/05 1248)  Physical Exam: General: Alert and awake, oriented x3, not in any acute distress. HEENT: anicteric sclera,   Cardiovascular: tachy rate, normal r,  no murmur rubs or gallops Pulmonary: rhonchi throughout Gastrointestinal: soft nontender, nondistended, normal bowel sounds, Musculoskeletal: 2+ edema Skin, diffuse hyperpigmented rash on chest and other areas Neuro: nonfocal, strength and sensation intact  CBC:  CBC Latest Ref Rng & Units 06/01/2016 05/31/2016 05/30/2016  WBC 4.0 - 10.5 K/uL 4.5 6.2 6.2  Hemoglobin 13.0 - 17.0 g/dL 8.9(L) 8.5(L) 8.9(L)  Hematocrit 39.0 - 52.0 % 28.2(L) 27.1(L) 27.5(L)  Platelets 150 - 400 K/uL 140(L) 144(L) 167      BMET  Recent Labs  05/31/16 0718 06/01/16 0532  NA 132* 132*  K 6.1* 4.8  CL 95* 96*  CO2 24 24  GLUCOSE 99 117*  BUN 51* 47*  CREATININE 9.16* 7.57*  CALCIUM 8.8* 8.3*     Liver Panel   Recent Labs  05/30/16 0030  05/31/16 0718 06/01/16 0532  PROT 7.4  --   --   --   ALBUMIN 3.2*  < > 2.7* 2.8*  AST 22  --   --   --   ALT 12*  --   --   --   ALKPHOS 47  --   --   --   BILITOT 1.0  --   --   --   < > = values in this interval not displayed.     Sedimentation Rate No results for input(s): ESRSEDRATE in the last 72 hours. C-Reactive Protein No results for input(s): CRP in the last 72 hours.  Micro Results: Recent Results (from the past 720 hour(s))  MRSA PCR Screening     Status: Abnormal   Collection Time: 05/12/16  6:13 PM  Result Value Ref Range Status   MRSA by PCR POSITIVE (A) NEGATIVE Final    Comment:        The GeneXpert MRSA Assay (FDA approved for NASAL specimens only), is one component of a comprehensive MRSA colonization surveillance program. It  is not intended to diagnose MRSA infection nor to guide or monitor  treatment for MRSA infections. RESULT CALLED TO, READ BACK BY AND VERIFIED WITH: D HART RN 2010 05/12/16 A BROWNING   CSF culture with Stat gram stain     Status: None   Collection Time: 05/17/16  9:40 AM  Result Value Ref Range Status   Specimen Description CSF  Final   Special Requests Immunocompromised  Final   Gram Stain NO WBC SEEN NO ORGANISMS SEEN CYTOSPIN SMEAR   Final   Culture NO GROWTH 3 DAYS  Final   Report Status 05/20/2016 FINAL  Final  Blood Culture (routine x 2)     Status: None (Preliminary result)   Collection Time: 05/30/16 12:30 AM  Result Value Ref Range Status   Specimen Description BLOOD RIGHT ARM  Final   Special Requests   Final    BOTTLES DRAWN AEROBIC AND ANAEROBIC Blood Culture adequate volume   Culture NO GROWTH 2 DAYS  Final   Report Status PENDING  Incomplete  Blood Culture (routine x 2)     Status: None (Preliminary result)   Collection Time: 05/30/16 12:48 AM  Result Value Ref Range Status   Specimen Description BLOOD RIGHT FOREARM  Final   Special Requests AEROBIC BOTTLE ONLY Blood Culture adequate volume  Final   Culture NO GROWTH 2 DAYS  Final   Report Status PENDING  Incomplete    Studies/Results: No results found.    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 male with HIV/AIDS with admission with fevers, cough and concern for possible PCP pneumonia  #1 Possible PCP pneumonia:  Continue  clindamycin and oral primaquine x 21 d total  and  Prednisone (which needs to be tapered)  40mg  bid x 5 days total 40mg  daily x 5 days 20mg  x 11 days    #2 HIV/AIDS: started Tivicay and Descovy. He was  interested in [place].  Discussed with patient and his mother.   He claimed That he has not been able to answer the phone when [facility] calls in part because he has been in the hospital so much and he also does claim he has been getting meds from [facility] and had so many left over that he did not need to call for refill  I asked if we could have [facility] call his number while he was here but his phone was not charged when I visited him.  His mom wanted to see if [facility] could call her number and Dimitri Ped was investigating this with [facility]  (while she is HCPOA she cannot excecute that role until and unless her son has no capacity) but perhaps the pharmacy will allow her to help arrange this  We will try to get BIKTARVY approved as the [facility] pt and so it can be delivered by [facility] otherwise would like ot change to Tivicay and Descovy  Explained that we are happy that he has made some progress with his virological suppression but that we need to have continued suppression and that his virus should come down to levels below 200 and below 20. I  did tell him and his mother that the viral load measurement is more reliable in our clinic when it is actually low because the specimens are processed more promptly.  I also explained that he really does need to come in and be seen frequently not just on visits with his primary ID physician Dr. Baxter Flattery but separately and more frequently he needs to be seen by ID pharmacy [facility] and Mil[facility] Glazier)  ONLY with SUSTAINED ADHERENCE and VIROLOGICAL SUPPRESSION is he going to be able to reconstitute his immune system and his CD4 count specifically.  In the interim after he finishes his treatment for PCP he will need to go back to being on PCP prophylaxis    #3 ESRD: his mother wants to donate her kidney for him to get a renal rinse plan. I told both of them that this is certainly possible but only if he can maintain proper  virological suppression. It is also possible that the transplant team would require that he achieved some level of immune reconstitution which will take some time given his profound CD4 lymphopenia. I am optimistic that this can be done if he does maintain virological suppression.  I would like to have him kept overnight so that we can check into the status of his antiretroviral prescriptions and ensure that he has close follow-up in the clinic.  I spent greater than 40 minutes with the patient including greater than 50% of time in face to face counsel of the patient and his mother with regards to counseling regarding his HIV disease and AIDS antiretroviral therapies including new therapies getting now proposed therapy of single tablet regimen to go home on need for sustained virological suppression through continued high level of adherence to his anti-retroviral medications, possibility of renal transplantation in the future  and in coordination of his care.    LOS: 2 days   Jim Wyatt 06/01/2016, 6:10 PM

## 2016-06-02 ENCOUNTER — Telehealth: Payer: Self-pay | Admitting: Pharmacist Clinician (PhC)/ Clinical Pharmacy Specialist

## 2016-06-02 ENCOUNTER — Other Ambulatory Visit: Payer: Self-pay | Admitting: Pharmacist Clinician (PhC)/ Clinical Pharmacy Specialist

## 2016-06-02 DIAGNOSIS — Z992 Dependence on renal dialysis: Secondary | ICD-10-CM

## 2016-06-02 DIAGNOSIS — N186 End stage renal disease: Secondary | ICD-10-CM

## 2016-06-02 DIAGNOSIS — G4733 Obstructive sleep apnea (adult) (pediatric): Secondary | ICD-10-CM

## 2016-06-02 LAB — URINALYSIS, ROUTINE W REFLEX MICROSCOPIC
Bilirubin Urine: NEGATIVE
Glucose, UA: >=500 mg/dL — AB
Hgb urine dipstick: NEGATIVE
Ketones, ur: NEGATIVE mg/dL
Leukocytes, UA: NEGATIVE
Nitrite: NEGATIVE
Protein, ur: >=300 mg/dL — AB
Specific Gravity, Urine: 1.014 (ref 1.005–1.030)
pH: 9 — ABNORMAL HIGH (ref 5.0–8.0)

## 2016-06-02 LAB — RENAL FUNCTION PANEL
Albumin: 2.7 g/dL — ABNORMAL LOW (ref 3.5–5.0)
Anion gap: 13 (ref 5–15)
BUN: 44 mg/dL — ABNORMAL HIGH (ref 6–20)
CO2: 26 mmol/L (ref 22–32)
Calcium: 8.4 mg/dL — ABNORMAL LOW (ref 8.9–10.3)
Chloride: 98 mmol/L — ABNORMAL LOW (ref 101–111)
Creatinine, Ser: 6.69 mg/dL — ABNORMAL HIGH (ref 0.61–1.24)
GFR calc Af Amer: 11 mL/min — ABNORMAL LOW (ref >60)
GFR calc non Af Amer: 10 mL/min — ABNORMAL LOW (ref >60)
Glucose, Bld: 104 mg/dL — ABNORMAL HIGH (ref 65–99)
Phosphorus: 5.6 mg/dL — ABNORMAL HIGH (ref 2.5–4.6)
Potassium: 4.1 mmol/L (ref 3.5–5.1)
Sodium: 137 mmol/L (ref 135–145)

## 2016-06-02 LAB — CBC
HCT: 27.7 % — ABNORMAL LOW (ref 39.0–52.0)
Hemoglobin: 9.1 g/dL — ABNORMAL LOW (ref 13.0–17.0)
MCH: 31.2 pg (ref 26.0–34.0)
MCHC: 32.9 g/dL (ref 30.0–36.0)
MCV: 94.9 fL (ref 78.0–100.0)
Platelets: 154 10*3/uL (ref 150–400)
RBC: 2.92 MIL/uL — ABNORMAL LOW (ref 4.22–5.81)
RDW: 18.5 % — ABNORMAL HIGH (ref 11.5–15.5)
WBC: 4.1 10*3/uL (ref 4.0–10.5)

## 2016-06-02 MED ORDER — PREDNISONE 20 MG PO TABS: 20.0000 mg | ORAL_TABLET | Freq: Every day | ORAL | Status: DC

## 2016-06-02 MED ORDER — DOLUTEGRAVIR SODIUM 50 MG PO TABS: 50.0000 mg | ORAL_TABLET | Freq: Every day | ORAL | 6 refills | Status: DC

## 2016-06-02 MED ORDER — ALTEPLASE 2 MG IJ SOLR: 2.0000 mg | Freq: Once | INTRAMUSCULAR | Status: DC | PRN

## 2016-06-02 MED ORDER — LIDOCAINE-PRILOCAINE 2.5-2.5 % EX CREA: 1.0000 "application " | TOPICAL_CREAM | CUTANEOUS | Status: DC | PRN

## 2016-06-02 MED ORDER — PRIMAQUINE PHOSPHATE 26.3 MG PO TABS: 30.0000 mg | ORAL_TABLET | Freq: Every day | ORAL | 0 refills | Status: DC

## 2016-06-02 MED ORDER — CLINDAMYCIN HCL 300 MG PO CAPS: 600.0000 mg | ORAL_CAPSULE | Freq: Three times a day (TID) | ORAL | 0 refills | Status: DC

## 2016-06-02 MED ORDER — PREDNISONE 20 MG PO TABS: 40.0000 mg | ORAL_TABLET | Freq: Two times a day (BID) | ORAL | Status: DC

## 2016-06-02 MED ORDER — LIDOCAINE HCL (PF) 1 % IJ SOLN: 5.0000 mL | INTRAMUSCULAR | Status: DC | PRN

## 2016-06-02 MED ORDER — EMTRICITABINE-TENOFOVIR AF 200-25 MG PO TABS: 1.0000 | ORAL_TABLET | Freq: Every day | ORAL | 6 refills | Status: DC

## 2016-06-02 MED ORDER — PENTAFLUOROPROP-TETRAFLUOROETH EX AERO: 1.0000 "application " | INHALATION_SPRAY | CUTANEOUS | Status: DC | PRN

## 2016-06-02 MED ORDER — SODIUM CHLORIDE 0.9 % IV SOLN: 100.0000 mL | INTRAVENOUS | Status: DC | PRN

## 2016-06-02 MED ORDER — HEPARIN SODIUM (PORCINE) 1000 UNIT/ML DIALYSIS: 1000.0000 [IU] | INTRAMUSCULAR | Status: DC | PRN

## 2016-06-02 MED ORDER — PREDNISONE 20 MG PO TABS: 40.0000 mg | ORAL_TABLET | Freq: Every day | ORAL | Status: DC

## 2016-06-02 MED ORDER — SODIUM CHLORIDE 0.9 % IV SOLN
100.0000 mL | INTRAVENOUS | Status: DC | PRN
Start: 2016-06-02 — End: 2016-06-02

## 2016-06-02 MED ORDER — PREDNISONE 20 MG PO TABS: ORAL_TABLET | ORAL | 0 refills | Status: DC

## 2016-06-02 NOTE — Progress Notes (Signed)
Transitions of Care Pharmacy Note  Plan:  Educated on clindamycin, primaquine, and prednisone. Appeared to be hearing my education but would give limited responses to my questions. Follow-up adherence in outpatient setting --------------------------------------------- Jim Wyatt is an 36 y.o. male who presents with a chief complaint PCP Pneumonia. In anticipation of discharge, pharmacy has reviewed this patient's prior to admission medication history, as well as current inpatient medications listed per the [facility].  Current medication indications, dosing, frequency, and notable side effects reviewed with patient. patient verbalized understanding of current inpatient medication regimen and is aware that the After Visit Summary when presented, will represent the most accurate medication list at discharge.    Assessment: Understanding of regimen: fair Understanding of indications: fair Potential of compliance: poor Barriers to Obtaining Medications: No  Patient instructed to contact inpatient pharmacy team with further questions or concerns if needed.    Time spent preparing for discharge counseling: 15 minutes Time spent counseling patient: 10 minutes   Thank you for allowing pharmacy to be a part of this patient's care.  Demetrius Charity, PharmD Acute Care Pharmacy Resident  Pager: [phone] 06/02/2016

## 2016-06-02 NOTE — Progress Notes (Signed)
Family Medicine Teaching Service Daily Progress Note Intern Pager: [phone]  Patient name: Jim Wyatt Medical record number: [id number] Date of birth: 10/26/1980 Age: 36 y.o. Gender: male  Primary Care Provider: [name], MD Consultants: Infectious Disease, Nephrology Code Status: Full (confirmed on admission)  Pt Overview and Major Events to Date:  04/03: Admit to SDU for acute respiratory failure with sepsis, received HD 04/04: HD, respiratory status improved on steroids and treatment for PCP PNA 04/05: Normal work of breathing on RA, HD  Assessment and Plan: Jim Wyatt is a 36 y.o. male presenting with dyspnea. PMH is significant for poorly controlled hypertension, headache, HIV with AIDS, end-stage renal disease on dialysis (T/TH/S) and history of DVT x 1 in 2015 (not on any anti-coagulation).  #Hypoxic Acute Respiratory Failure  Sepsis  PCP Pneumonia: Resolved. Afebrile over 24 hrs on room air. CXR concerning for mod interstitial and alveolar edema. DDx glutes PCP PNA, HAP, fluid overload secondary to ESRD. --Jim Wyatt 800 mg IV q8h + Jim Wyatt 30 mg PO QD (day 4, 4/3-), s/p fortaz+aztreonam (4/3), patient refused vancomycin --Prednisone 40 mg BID (day 4, 4/3-) --Continuous pulse ox with supplemental O2, goal at or >92%, pt refusing Jim Wyatt --Blood and urine cultures NGTD  #Renovascular Hypertension  Tachycardia: Improving. BP 150-160/90s. Tachycardia persisted. On ARB, BB, and hydralazine at home. --Continue hydralazine 100 mg QD, and losartan 100 mg QD, refusing metoprolol-XL 100 mg QD --Patient refusing any additional antihypertensive medications at this time --Continuous cardiac monitoring  #End-Stage Renal Disease: Receives dialysis on a T/TH/S. --Nephrology consulted, appreciate assistance --Renal function panel daily  #AIDS: Last CD4 ct 4 on 3/16 with HIV RNA Quant 3.210. ID specialists during previous hospitalization recommending Prezcobix,  abacavir, Jim Wyatt and scheduled outpatient follow-up per patient. He was scheduled for follow-up 4/3.  --ID consulted, appreciate recommendations --Prezcobix, Abacavir, Jim Wyatt for ART therapy with intentions to switch to Jim Wyatt and Jim Wyatt, pharmacy aware --Follow-up HIV labs --Will need to restart home dapsone and azithromycin after completing of abx  #Abdominal Swelling  Diarrhea: Noted to have this in the past when fluid overloaded. Receiving HD for suspected fluid overload on admission. --Consider abdominal US if persists   #History of DVT: Bilateral 2015, due to immobility. Completed course of anticoagulation. No LE tenderness, erythema or edema.  --Heparin SQ  #Seizure Disorder: Last seizure in January 2018. Two seizures in lifetime.Taking Jim Wyatt daily.  --Continue Jim Wyatt 500 mg QD with additional 250 mg on dialysis days (T/TH/S)  #Possible OSA: Does not use CPAP.  --Patient will need polysomnography as outpatient  #Asthma: No wheezing. Takes albuterol when necessary at home. Denies tobacco use.  --Jim Wyatt PRN --Outpatient PFTs  #Medical Noncompliance: Patient refusing to take recommended antibiotics, antihypertensives, and Jim Wyatt. Explained to patient the risks of refusing, he expresses understanding.  #Hx of Headache: Active headache consistent with previous headaches during dialysis, currently on HD. Was recently admitted last month for headache with visual changes. Started on Jim Wyatt and hypertensive medications were adjusted. Remains hypertensive. --Jim Wyatt 50 mg BID --Continue HTN meds as above  FEN/GI: Renal diet with fluid restriction Prophylaxis: Heparin SQ  Disposition: Pending follow-up respiratory status and HIV med transition.  Subjective:  Patient says he is ready to leave. Denies shortness of breath or chest pain.  Objective: Temp:  [97.9 F (36.6 C)-99 F (37.2 C)] 98.6 F (37 C) (04/05 2143) Pulse Rate:  [88-106] 106 (04/05 2143) Resp:   [17-23] 18 (04/05 2143) BP: (138-178)/(79-109) 138/79 (04/05 2143) SpO2:  [96 %-98 %] 98 % (  04/05 2143) Weight:  [157 lb 6.5 oz (71.4 kg)] 157 lb 6.5 oz (71.4 kg) (04/05 1248) Physical Exam: General: pleasent sitting upright eating breakfast at bedside, well developed, non-toxic appearance HEENT: normocephalic, atraumatic, moist mucous membranes CV: regular rate and rhythm without murmurs, rubs, or gallops Lungs: bibasilar crackles, no wheezing, normal work of breathing on room air Abdomen: soft, non-tender, normoactive bowel sounds Skin: warm, dry, no rashes or lesions, cap refill < 2 seconds Extremities: warm and well perfused, normal tone  Laboratory:  Recent Labs Lab 05/31/16 0718 06/01/16 0532 06/02/16 0513  WBC 6.2 4.5 4.1  HGB 8.5* 8.9* 9.1*  HCT 27.1* 28.2* 27.7*  PLT 144* 140* 154    Recent Labs Lab 05/30/16 0030  05/31/16 0718 06/01/16 0532 06/02/16 0513  NA 139  < > 132* 132* 137  K 6.2*  < > 6.1* 4.8 4.1  CL 98*  < > 95* 96* 98*  CO2 25  < > 24 24 26   BUN 64*  < > 51* 47* 44*  CREATININE 11.75*  < > 9.16* 7.57* 6.69*  CALCIUM 9.5  < > 8.8* 8.3* 8.4*  PROT 7.4  --   --   --   --   BILITOT 1.0  --   --   --   --   ALKPHOS 47  --   --   --   --   ALT 12*  --   --   --   --   AST 22  --   --   --   --   GLUCOSE 81  < > 99 117* 104*  < > = values in this interval not displayed. Urinalysis    Component Value Date/Time   COLORURINE YELLOW 12/28/2015 2303   APPEARANCEUR CLEAR 12/28/2015 2303   LABSPEC 1.019 12/28/2015 2303   PHURINE 8.0 12/28/2015 2303   GLUCOSEU 100 (A) 12/28/2015 2303   HGBUR MODERATE (A) 12/28/2015 2303   BILIRUBINUR Negative 01/19/2016 [place] 12/28/2015 2303   PROTEINUR >=300 01/19/2016 0934   PROTEINUR >300 (A) 12/28/2015 2303   UROBILINOGEN 0.2 01/19/2016 0934   UROBILINOGEN 0.2 12/18/2014 1925   NITRITE Negative 01/19/2016 0934   NITRITE NEGATIVE 12/28/2015 2303   LEUKOCYTESUR Negative 01/19/2016 0934    Urine culture: NGTD Blood culture: NGTD Lactic acid: 1.39 > 1.22 Lactic dehydrogenase: 414 (H) Lipase: 36 (WNL) HIV RNA Quant: Pending Integrase HIV: Pending CD4/CD8: CD4 5% Influenza panel: Negative I-STAT arterial blood gas: pH 7.487: PCO2 36.1: PCO2 85.0: Bicarbonate 27.0: O2 sat 96%  Imaging/Diagnostic Tests: CT ANGIO CHEST PE W OR WO CONTRAST (05/30/2016) FINDINGS: Cardiovascular: No large or central pulmonary emboli. Reduced sensitivity for detection of small peripheral emboli. Small pericardial effusion, reduced from 05/12/2016. Mediastinum/Nodes: No enlarged mediastinal, hilar, or axillary lymph nodes. Thyroid gland, trachea, and esophagus demonstrate no significant findings. Lungs/Pleura: Diffuse central airspace opacities and interlobular septal thickening, likely interstitial and alveolar edema. Infectious infiltrates less likely but not excluded. Airways are patent and normal in caliber. Small pleural effusions bilaterally. Upper Abdomen: No acute abnormality. Musculoskeletal: No chest wall abnormality. No acute or significant osseous findings. Review of the MIP images confirms the above findings.  IMPRESSION: 1. No large or central pulmonary emboli 2. Diffuse interstitial and alveolar opacity. This may represent congestive failure or fluid overload. Infectious infiltrates are less likely. 3. Small pleural effusions.  DG Chest Portable 1 View (05/30/2016) FINDINGS: Stable cardiomegaly. Mild hazy central and basilar opacities, likely alveolar edema. Moderate interstitial  fluid or thickening. No large pleural effusions.  IMPRESSION: Stable cardiomegaly.  Interstitial and alveolar edema.  [place] Bing, DO 06/02/2016, 8:12 AM PGY-1, [facility] Intern pager: [phone], text pages welcome

## 2016-06-02 NOTE — Discharge Instructions (Signed)
Your admitted for acute respiratory failure. We have treated you for a pneumonia called PCP (see below for information) which was thought to have been the source. You will continue your clindamycin and primaquine for 18 more days. In addition, you will continue a prednisone taper as follows: Take 2 tabs this evening (4/6) followed by 2 tabs twice tomorrow (4/7), followed by 2 tab daily for 5 days (4/8-4/12), followed by 1 tab daily for 11 days (4/13-4/23).  Infectious disease followed your care with recommendations for you to switch your HIV medications to Tivicay and Descovy (see below for information). You will need to pick up your prescription at your pharmacy. Please stop your former HIV medications after starting these.    Pneumocystis Pneumonia Pneumocystis pneumonia is an infection that affects the lungs. The infection is extremely rare in healthy people. It most frequently occurs in people whose natural disease-fighting system (immune system) is weak. What are the causes? This condition is caused by breathing in a fungus called Pneumocystis jiroveci. What are the signs or symptoms? Common symptoms of this condition include:  Fever.  Mild or dry cough.  Shortness of breath, especially with any physical activity.  Rapid breathing. How is this diagnosed? This condition may be diagnosed with tests, such as:  A test in which a sample of fluid or tissue from your lungs is looked at under a microscope (immunologic stain). To get the sample, you may be asked to cough into a tissue. Or, a small, flexible tube will be guided to your lungs through your nose or mouth to get the sample (bronchoscopy).  A chest X-ray.  Blood tests. How is this treated? This condition is treated with antibiotic medicine. Sometimes it is also treated with a medicine to reduce inflammation (steroid). Treatment may begin before test results confirm your diagnosis. This is because the condition is very serious.  Treatment may take several weeks. Follow these instructions at home:  Take over-the-counter and prescription medicines only as told by your health care provider.  Take your antibiotic medicine as told by your health care provider. Do not stop taking the antibiotic even if you start to feel better.  Do not smoke. Smoking can make your condition worse.  Keep all follow-up visits as told by your health care provider. This is important. How is this prevented? If you are at high risk for getting this condition again, your health care provider may prescribe an antibiotic to prevent this from happening. Contact a health care provider if:  Any of your symptoms are getting worse instead of better.  You have nausea or vomiting.  You have diarrhea.  You have a skin rash. This information is not intended to replace advice given to you by your health care provider. Make sure you discuss any questions you have with your health care provider. Document Released: 05/06/2002 Document Revised: 11/26/2015 Document Reviewed: 11/26/2015 Elsevier Interactive Patient Education  2017 [place] Oral tablet What is this medicine? DOLUTEGRAVIR (doe loo teg ra veer) is an antiretroviral medicine. It is used with other medicines to treat HIV. This medicine is not a cure for HIV. It will not stop the spread of HIV to others. This medicine may be used for other purposes; ask your health care provider or pharmacist if you have questions. COMMON BRAND NAME(S): TIVICAY What should I tell my health care provider before I take this medicine? They need to know if you have any of these conditions: -liver disease -an unusual or  allergic reaction to dolutegravir, other medicines, lactose, foods, dyes, or preservatives -pregnant or trying to get pregnant -breast-feeding How should I use this medicine? Take this medicine by mouth with a glass of water. Follow the directions on the prescription label.  You can take it with or without food. Take your medicine at regular intervals. Do not take your medicine more often than directed. For your anti-HIV therapy to work as well as possible, take each dose exactly as prescribed. Do not skip doses or stop your medicine even if you feel better. Skipping doses may make the HIV virus resistant to this medicine and other medicines. Do not stop taking except on your doctor's advice. Talk to your pediatrician regarding the use of this medicine in children. While this drug may be prescribed for children as young as 6 years for selected conditions, precautions do apply. Overdosage: If you think you have taken too much of this medicine contact a poison control center or emergency room at once. NOTE: This medicine is only for you. Do not share this medicine with others. What if I miss a dose? If you miss a dose, take it as soon as you can. If your next dose is to be taken in less than 4 hours, then do not take the missed dose. Take the next dose at your regular time. Do not take double or extra doses. What may interact with this medicine? Do not take this medicine with any of the following medications: -dofetilide This medicine may also interact with the following medications: -antiviral medicines for HIV or AIDS like efavirenz, etravirine, nevirapine, fosamprenavir/ritonavir, tipranavir/ritonavir -buffered medicines, like buffered aspirin -calcium supplements -certain antacids -certain laxatives -certain medicines for seizures like carbamazepine, oxcarbazepine, phenytoin, phenobarbital -iron supplements -metformin -rifampin -St. John's Wort; Hypericum perforatum -sucralfate This list may not describe all possible interactions. Give your health care provider a list of all the medicines, herbs, non-prescription drugs, or dietary supplements you use. Also tell them if you smoke, drink alcohol, or use illegal drugs. Some items may interact with your  medicine. What should I watch for while using this medicine? Visit your doctor or health care professional for regular check ups. Discuss any new symptoms with your doctor. You will need to have important blood work done while on this medicine. HIV is spread to others through sexual or blood contact. Talk to your doctor about how to stop the spread of HIV. What side effects may I notice from receiving this medicine? Side effects that you should report to your doctor or health care professional as soon as possible: -allergic reactions like skin rash, itching or hives, swelling of the face, lips, or tongue -breathing problems -fever -muscle or joint pain -redness, blistering, peeling or loosening of the skin, including inside the mouth -unusually weak or tired -weight gain around waist, back, or thinning of face, arms, legs Side effects that usually do not require medical attention (report to your doctor or health care professional if they continue or are bothersome): -diarrhea -dizziness -headache -nausea, vomiting -stomach pain -trouble sleeping This list may not describe all possible side effects. Call your doctor for medical advice about side effects. You may report side effects to FDA at 1-800-FDA-1088. Where should I keep my medicine? Keep out of the reach of children. Store at room temperature between 20 and 25 degrees C (68 and 77 degrees F). Throw away any unused medicine after the expiration date. NOTE: This sheet is a summary. It may not cover all possible information.  If you have questions about this medicine, talk to your doctor, pharmacist, or health care provider.  2018 Elsevier/Gold Standard (2015-03-20 07:57:53)  Emtricitabine; tenofovir alafenamide oral tablet What is this medicine? EMTRICITABINE; TENOFOVIR ALAFENAMIDE (em tri SIT uh bean; te NOE fo veer) is two antiretroviral medicines in one tablet. It is used to treat HIV. This medicine is not a cure for HIV. It will  not stop the spread of HIV to others. This medicine may be used for other purposes; ask your health care provider or pharmacist if you have questions. COMMON BRAND NAME(S): Descovy What should I tell my health care provider before I take this medicine? They need to know if you have any of these conditions: -kidney disease -liver disease -an unusual or allergic reaction to emtricitabine, tenofovir, other medicines, foods, dyes, or preservative -pregnant or trying to get pregnant -breast-feeding How should I use this medicine? Take this medicine by mouth with a glass of water. Follow the directions on the prescription label. You can take it with or without food. If it upsets your stomach, take it with food. Take your medicine at regular intervals. Do not take your medicine more often than directed. For your anti-HIV therapy to work as well as possible, take each dose exactly as prescribed. Do not skip doses or stop your medicine even if you feel better. Skipping doses may make the HIV virus resistant to this medicine and other medicines. Do not stop taking except on your doctor's advice. Talk to your pediatrician regarding the use of this medicine in children. While this drug may be prescribed for selected conditions, precautions do apply. Overdosage: If you think you have taken too much of this medicine contact a poison control center or emergency room at once. NOTE: This medicine is only for you. Do not share this medicine with others. What if I miss a dose? If you miss a dose, take it as soon as you can. If it is almost time for your next dose, take only that dose. Do not take double or extra doses. What may interact with this medicine? Do not take this medicine with any of the following medications: -adefovir -any medicine that contains lamivudine -any medicine that contains emtricitabine or tenofovir This medicine may also interact with the following medications: -certain antibiotics like  rifabutin, rifampin, rifapentine, aminoglycosides -certain medicines for seizures like carbamazepine, oxcarbazepine, phenobarbital, phenytoin -medicines for viral infections like cidofovir, acyclovir, valacyclovir, ganciclovir, valganciclovir -non-steroidal antiinflammatory drugs (NSAIDs) -St. John's Wort -tipranavir with ritonavir This list may not describe all possible interactions. Give your health care provider a list of all the medicines, herbs, non-prescription drugs, or dietary supplements you use. Also tell them if you smoke, drink alcohol, or use illegal drugs. Some items may interact with your medicine. What should I watch for while using this medicine? Visit your doctor or health care professional for regular check ups. Discuss any new symptoms with your doctor. You will need to have important blood work done while on this medicine. HIV is spread to others through sexual or blood contact. Talk to your doctor about how to stop the spread of HIV. If you have hepatitis B, talk to your doctor if you plan to stop this medicine. The symptoms of hepatitis B may get worse if you stop this medicine. What side effects may I notice from receiving this medicine? Side effects that you should report to your doctor or health care professional as soon as possible: -allergic reactions like skin rash, itching or  hives, swelling of the face, lips, or tongue -breathing problems -fast, irregular heartbeat -muscle pain or weakness -signs and symptoms of kidney injury like trouble passing urine or change in the amount of urine -signs and symptoms of liver injury like dark yellow or brown urine; general ill feeling or flu-like symptoms; light-colored stools; loss of appetite; right upper belly pain; unusually weak or tired; yellowing of the eyes or skin Side effects that usually do not require medical attention (report to your doctor or health care professional if they continue or are  bothersome): -nausea This list may not describe all possible side effects. Call your doctor for medical advice about side effects. You may report side effects to FDA at 1-800-FDA-1088. Where should I keep my medicine? Keep out of the reach of children. Store at room temperature below 30 degrees C (86 degrees F). Throw away any unused medicine after the expiration date. NOTE: This sheet is a summary. It may not cover all possible information. If you have questions about this medicine, talk to your doctor, pharmacist, or health care provider.  2018 Elsevier/Gold Standard (2015-11-29 13:17:45)

## 2016-06-02 NOTE — Progress Notes (Addendum)
Subjective: No new complaints, feeling better   Antibiotics:  Anti-infectives    Start     Dose/Rate Route Frequency Ordered Stop   06/03/16 0000  primaquine 26.3 MG tablet     30 mg Oral Daily 06/02/16 1324 06/21/16 2359   06/02/16 0000  clindamycin (CLEOCIN) 300 MG capsule     600 mg Oral Every 8 hours 06/02/16 1324 06/21/16 2359   06/01/16 1400  clindamycin (CLEOCIN) capsule 600 mg     600 mg Oral Every 8 hours 06/01/16 1227     05/31/16 1313  emtricitabine-tenofovir AF (DESCOVY) 200-25 MG per tablet 1 tablet     1 tablet Oral Daily after supper 05/31/16 1314     05/31/16 1313  dolutegravir (TIVICAY) tablet 50 mg     50 mg Oral Daily after supper 05/31/16 1314     05/30/16 2200  cefTAZidime (FORTAZ) 1 g in dextrose 5 % 50 mL IVPB  Status:  Discontinued     1 g 100 mL/hr over 30 Minutes Intravenous Daily at bedtime 05/30/16 0536 05/30/16 1322   05/30/16 1800  darunavir-cobicistat (PREZCOBIX) 800-150 MG per tablet 1 tablet  Status:  Discontinued     1 tablet Oral Every evening 05/30/16 1401 05/30/16 1625   05/30/16 1800  dolutegravir (TIVICAY) tablet 50 mg  Status:  Discontinued     50 mg Oral Every evening 05/30/16 1401 05/30/16 1625   05/30/16 1800  dolutegravir (TIVICAY) tablet 50 mg  Status:  Discontinued     50 mg Oral Daily 05/30/16 1701 05/31/16 1314   05/30/16 1800  emtricitabine-tenofovir AF (DESCOVY) 200-25 MG per tablet 1 tablet  Status:  Discontinued     1 tablet Oral Daily 05/30/16 1701 05/31/16 1314   05/30/16 1700  abacavir (ZIAGEN) tablet 600 mg  Status:  Discontinued     600 mg Oral Daily with supper 05/30/16 1401 05/30/16 1625   05/30/16 1400  sulfamethoxazole-trimethoprim (BACTRIM) 325 mg in dextrose 5 % 500 mL IVPB  Status:  Discontinued     325 mg 346.9 mL/hr over 90 Minutes Intravenous Every 24 hours 05/30/16 1335 05/30/16 1338   05/30/16 1400  primaquine tablet 30 mg     30 mg Oral Daily 05/30/16 1344     05/30/16 1400  clindamycin (CLEOCIN)  IVPB 900 mg  Status:  Discontinued     900 mg 100 mL/hr over 30 Minutes Intravenous Every 8 hours 05/30/16 1344 06/01/16 1227   05/30/16 0600  cefTAZidime (FORTAZ) 1 g in dextrose 5 % 50 mL IVPB     1 g 100 mL/hr over 30 Minutes Intravenous  Once 05/30/16 0536 05/30/16 0656   05/30/16 0015  aztreonam (AZACTAM) 2 g in dextrose 5 % 50 mL IVPB     2 g 100 mL/hr over 30 Minutes Intravenous  Once 05/30/16 0009 05/30/16 0204   05/30/16 0015  vancomycin (VANCOCIN) IVPB 1000 mg/200 mL premix  Status:  Discontinued     1,000 mg 200 mL/hr over 60 Minutes Intravenous  Once 05/30/16 0009 05/30/16 0010   05/30/16 0015  vancomycin (VANCOCIN) 1,250 mg in sodium chloride 0.9 % 250 mL IVPB  Status:  Discontinued     1,250 mg 166.7 mL/hr over 90 Minutes Intravenous  Once 05/30/16 0010 05/30/16 1322      Medications: Scheduled Meds: . clindamycin  600 mg Oral Q8H  . darbepoetin (ARANESP) injection - DIALYSIS  200 mcg Intravenous Q Thu-HD  . dolutegravir  50 mg  Oral QPC supper  . emtricitabine-tenofovir AF  1 tablet Oral QPC supper  . heparin  5,000 Units Subcutaneous Q8H  . hydrALAZINE  75 mg Oral TID  . levETIRAcetam  250 mg Oral Once per day on Tue Thu Sat  . levETIRAcetam  500 mg Oral Daily  . losartan  100 mg Oral QHS  . mouth rinse  15 mL Mouth Rinse BID  . predniSONE  40 mg Oral BID WC   Followed by  . [START ON 06/04/2016] predniSONE  40 mg Oral Q breakfast   Followed by  . [START ON 06/09/2016] predniSONE  20 mg Oral Q breakfast  . primaquine  30 mg Oral Daily  . topiramate  50 mg Oral BID  . vitamin C  500 mg Oral Daily   Continuous Infusions: PRN Meds:.sodium chloride, sodium chloride, acetaminophen **OR** acetaminophen, albuterol, alteplase, heparin, hydrALAZINE, ipratropium-albuterol, lidocaine (PF), lidocaine-prilocaine, ondansetron, pentafluoroprop-tetrafluoroeth    Objective: Weight change: -5 lb 8.2 oz (-2.5 kg)  Intake/Output Summary (Last 24 hours) at 06/02/16 1333 Last  data filed at 06/02/16 0400  Gross per 24 hour  Intake              360 ml  Output             2689 ml  Net            -2329 ml   Blood pressure (!) 167/110, pulse 94, temperature 98.3 F (36.8 C), temperature source Oral, resp. rate 16, height 5\' 7"  (1.702 m), weight 157 lb 6.5 oz (71.4 kg), SpO2 98 %. Temp:  [98.3 F (36.8 C)-98.6 F (37 C)] 98.3 F (36.8 C) (04/06 0854) Pulse Rate:  [94-106] 94 (04/06 0854) Resp:  [16-23] 16 (04/06 0854) BP: (138-178)/(79-110) 167/110 (04/06 0854) SpO2:  [98 %] 98 % (04/06 0854)  Physical Exam: General: Alert and awake, oriented x3, not in any acute distress. HEENT: anicteric sclera,   Cardiovascular: tachy rate, normal r,  no murmur rubs or gallops Pulmonary: less wheezing Gastrointestinal: soft nontender, nondistended, normal bowel sounds, Musculoskeletal: 2+ edema Skin, diffuse hyperpigmented rash on chest and other areas Neuro: nonfocal, strength and sensation intact  CBC:  CBC Latest Ref Rng & Units 06/02/2016 06/01/2016 05/31/2016  WBC 4.0 - 10.5 K/uL 4.1 4.5 6.2  Hemoglobin 13.0 - 17.0 g/dL 9.1(L) 8.9(L) 8.5(L)  Hematocrit 39.0 - 52.0 % 27.7(L) 28.2(L) 27.1(L)  Platelets 150 - 400 K/uL 154 140(L) 144(L)      BMET  Recent Labs  06/01/16 0532 06/02/16 0513  NA 132* 137  K 4.8 4.1  CL 96* 98*  CO2 24 26  GLUCOSE 117* 104*  BUN 47* 44*  CREATININE 7.57* 6.69*  CALCIUM 8.3* 8.4*     Liver Panel   Recent Labs  06/01/16 0532 06/02/16 0513  ALBUMIN 2.8* 2.7*       Sedimentation Rate No results for input(s): ESRSEDRATE in the last 72 hours. C-Reactive Protein No results for input(s): CRP in the last 72 hours.  Micro Results: Recent Results (from the past 720 hour(s))  MRSA PCR Screening     Status: Abnormal   Collection Time: 05/12/16  6:13 PM  Result Value Ref Range Status   MRSA by PCR POSITIVE (A) NEGATIVE Final    Comment:        The GeneXpert MRSA Assay (FDA approved for NASAL specimens only), is one  component of a comprehensive MRSA colonization surveillance program. It is not intended to diagnose MRSA infection nor to guide  or monitor treatment for MRSA infections. RESULT CALLED TO, READ BACK BY AND VERIFIED WITH: D HART RN 2010 05/12/16 A BROWNING   CSF culture with Stat gram stain     Status: None   Collection Time: 05/17/16  9:40 AM  Result Value Ref Range Status   Specimen Description CSF  Final   Special Requests Immunocompromised  Final   Gram Stain NO WBC SEEN NO ORGANISMS SEEN CYTOSPIN SMEAR   Final   Culture NO GROWTH 3 DAYS  Final   Report Status 05/20/2016 FINAL  Final  Blood Culture (routine x 2)     Status: None (Preliminary result)   Collection Time: 05/30/16 12:30 AM  Result Value Ref Range Status   Specimen Description BLOOD RIGHT ARM  Final   Special Requests   Final    BOTTLES DRAWN AEROBIC AND ANAEROBIC Blood Culture adequate volume   Culture NO GROWTH 2 DAYS  Final   Report Status PENDING  Incomplete  Blood Culture (routine x 2)     Status: None (Preliminary result)   Collection Time: 05/30/16 12:48 AM  Result Value Ref Range Status   Specimen Description BLOOD RIGHT FOREARM  Final   Special Requests AEROBIC BOTTLE ONLY Blood Culture adequate volume  Final   Culture NO GROWTH 2 DAYS  Final   Report Status PENDING  Incomplete    Studies/Results: No results found.    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 male with HIV/AIDS with admission with fevers, cough and concern for possible PCP pneumonia  #1 Possible PCP pneumonia:  Continue  clindamycin and oral primaquine x 21 d total  and  Prednisone (which  needs to be tapered)  40mg  bid x 5 days total 40mg  daily x 5 days 20mg  x 11 days    #2 HIV/AIDS: started T[place]ay and Descovy. He was interested in [place].   His plan does NOT YET cover [place] without prior auth  Therefore will continue wth Tivicay and Descovy  Would ensure he gets dose today  [facility] are mailing drug to his house tomorrow and both he and his mother are aware oft his.  He is perfectly happy with this 2 pill regimen in interim. in our clinic when it is actually low because the specimens are processed more promptly.   In the interim after he finishes his treatment for PCP he will need to go back to being on PCP prophylaxis    #3 ESRD: his mother wants to donate her kidney for him to get a renal transplant: see yesterdays discussion  #4 OSA: he sleeps better with O2 via [place]. He has trouble with compliance with CPAP. I would recommend home O2 at night as reasonable alternative to CPAP until he can find better fitting mask.   I spent greater than 40 minutes with the patient including greater than 50% of time in face to face counsel of the patient and  regards to counseling regarding his HIV disease and AIDS antiretroviral therapies including new therapies getting now proposed therapy of single tablet regimen to go home on need for sustained virological suppression through continued high level of adherence  to his anti-retroviral medications, possibility of renal transplantation in the future  and in coordination of his care.  I am fine with DC to home today after HD  I will arrange HSFU in our clinic with ID pharmacy and with Dr. Baxter Flattery.   LOS: 3 days   Alcide Evener 06/02/2016, 1:33 PM

## 2016-06-02 NOTE — Progress Notes (Signed)
[facility] Progress Note   Subjective: still 5-6kg above his dry wt.   Vitals:   06/01/16 1558 06/01/16 1600 06/01/16 2143 06/02/16 0854  BP: (!) 158/101 (!) 149/80 138/79 (!) 167/110  Pulse: (!) 103 (!) 105 (!) 106 94  Resp: 18 (!) 23 18 16   Temp:  98.4 F (36.9 C) 98.6 F (37 C) 98.3 F (36.8 C)  TempSrc:  Oral Oral Oral  SpO2:   98% 98%  Weight:      Height:        Inpatient medications: . clindamycin  600 mg Oral Q8H  . darbepoetin (ARANESP) injection - DIALYSIS  200 mcg Intravenous Q Thu-HD  . dolutegravir  50 mg Oral QPC supper  . emtricitabine-tenofovir AF  1 tablet Oral QPC supper  . heparin  5,000 Units Subcutaneous Q8H  . hydrALAZINE  75 mg Oral TID  . levETIRAcetam  250 mg Oral Once per day on Tue Thu Sat  . levETIRAcetam  500 mg Oral Daily  . losartan  100 mg Oral QHS  . mouth rinse  15 mL Mouth Rinse BID  . predniSONE  40 mg Oral BID WC   Followed by  . [START ON 06/04/2016] predniSONE  40 mg Oral Q breakfast   Followed by  . [START ON 06/09/2016] predniSONE  20 mg Oral Q breakfast  . primaquine  30 mg Oral Daily  . topiramate  50 mg Oral BID  . vitamin C  500 mg Oral Daily    sodium chloride, sodium chloride, acetaminophen **OR** acetaminophen, albuterol, alteplase, heparin, hydrALAZINE, ipratropium-albuterol, lidocaine (PF), lidocaine-prilocaine, ondansetron, pentafluoroprop-tetrafluoroeth  Exam: Looks better, no distress +JVD Chest slight crackles at bases RRR Abd soft no ascites Ext trace edema LFA AVF+bruit  Dialysis: NS TTS 3h 79min   64.5kg   450/800   Hep none  LFA AVF - Hect 6ug - Fe 100 through 4/11 - Mircera 225 last 3/10 - last Hb 9.7, 28% sat , ferr 500's, Ca 10.4, P 6, pth 250      Assessment: 1. Acute resp failure/ diffuse pulm infiltrates/ fever  - vol overload and/or PNA (I.e. PCP). On empiric clinda/ primaquine, fevers better.  Has had daily HD x 3 with total 10 kg off, but still 5-6kg over dry wt.  Doesn't  follow fluid restriction as he should, we discussed goal of 1kg per day wt gains between treatments. Plan HD today. May be going home today.  2. HTN - severe, on ARB/  Hydralazine; pt refused to take procardia or metoprolol as felt he had skin itching and hives from the medications, they have been dc'd. 3. AIDS/ HIV - low counts, per ID  4. ESRD HD TTS 5. Headaches - on topomax 6. Seizure d/o - on keppra 7. Hx asthma  Plan - short HD again, restrict fluid intake severely , instruced pt.  If [place] home will go to OP HD tomrrow.    Kelly Splinter MD [facility] pager [phone]   06/02/2016, 1:55 PM    Recent Labs Lab 05/31/16 0718 06/01/16 0532 06/02/16 0513  NA 132* 132* 137  K 6.1* 4.8 4.1  CL 95* 96* 98*  CO2 24 24 26   GLUCOSE 99 117* 104*  BUN 51* 47* 44*  CREATININE 9.16* 7.57* 6.69*  CALCIUM 8.8* 8.3* 8.4*  PHOS 7.9* 7.6* 5.6*    Recent Labs Lab 05/30/16 0030  05/31/16 0718 06/01/16 0532 06/02/16 0513  AST 22  --   --   --   --  ALT 12*  --   --   --   --   ALKPHOS 47  --   --   --   --   BILITOT 1.0  --   --   --   --   PROT 7.4  --   --   --   --   ALBUMIN 3.2*  < > 2.7* 2.8* 2.7*  < > = values in this interval not displayed.  Recent Labs Lab 05/30/16 0030 05/31/16 0718 06/01/16 0532 06/02/16 0513  WBC 6.2 6.2 4.5 4.1  NEUTROABS 4.7  --   --   --   HGB 8.9* 8.5* 8.9* 9.1*  HCT 27.5* 27.1* 28.2* 27.7*  MCV 96.2 96.8 95.3 94.9  PLT 167 144* 140* 154   Iron/TIBC/Ferritin/ %Sat    Component Value Date/Time   IRON 59 01/12/2015 1617   TIBC 248 (L) 01/12/2015 1617   FERRITIN 1,050 (H) 02/01/2016 1836   IRONPCTSAT 24 01/12/2015 1617

## 2016-06-02 NOTE — Progress Notes (Signed)
Sending Rx to [facility] in [place].

## 2016-06-02 NOTE — Telephone Encounter (Signed)
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 [facility] will fill.

## 2016-06-02 NOTE — Care Management Important Message (Signed)
Important Message  Patient Details  Name: Jim Wyatt MRN: [id number] Date of Birth: Jun 14, 1980   Medicare Important Message Given:  Yes    Ac Colan, Rory Percy, RN 06/02/2016, 1:54 PM

## 2016-06-04 LAB — URINE CULTURE: Culture: >=100000 — AB

## 2016-06-04 LAB — CULTURE, BLOOD (ROUTINE X 2)
Culture: NO GROWTH
Culture: NO GROWTH
Special Requests: ADEQUATE
Special Requests: ADEQUATE

## 2016-06-07 DIAGNOSIS — G4733 Obstructive sleep apnea (adult) (pediatric): Secondary | ICD-10-CM

## 2016-06-07 NOTE — Procedures (Signed)
Patient Name: Jim Wyatt, Jim Wyatt Date: 05/22/2016 Gender: Male D.O.B: July 30, 1980 Age (years): 35 Referring Provider: [name] Height (inches): 25 Interpreting Physician: Baird Lyons MD, ABSM Weight (lbs): 150 RPSGT: Jacolyn Reedy BMI: 23 MRN: [id number] Neck Size: 14.00 CLINICAL INFORMATION The patient is referred for a split night study with BPAP. MEDICATIONS Medications self-administered by patient taken the night of the study : none reported  SLEEP STUDY TECHNIQUE As per the AASM Manual for the Scoring of Sleep and Associated Events v2.3 (April 2016) with a hypopnea requiring 4% desaturations.  The channels recorded and monitored were frontal, central and occipital EEG, electrooculogram (EOG), submentalis EMG (chin), nasal and oral airflow, thoracic and abdominal wall motion, anterior tibialis EMG, snore microphone, electrocardiogram, and pulse oximetry. Bi-level positive airway pressure (BiPAP) was initiated when the patient met split night criteria and was titrated according to treat sleep-disordered breathing.  RESPIRATORY PARAMETERS Diagnostic  Total AHI (/hr): 96.3 RDI (/hr): 101.7 OA Index (/hr): 71 CA Index (/hr): 5.9 REM AHI (/hr): 86.8 NREM AHI (/hr): 100.3 Supine AHI (/hr): 104.5 Non-supine AHI (/hr): 100.24 Min O2 Sat (%): 52.00 Mean O2 (%): 86.98 Time below 88% (min): 51.8   Titration  Optimal IPAP Pressure (cm): 20 Optimal EPAP Pressure (cm): 16 AHI at Optimal Pressure (/hr): 0.0 Min O2 at Optimal Pressure (%): 92.0 Sleep % at Optimal (%): 100 Supine % at Optimal (%): 100      SLEEP ARCHITECTURE The study was initiated at 8:37:31 AM and terminated at 2:49:21 PM. The total recorded time was 371.8 minutes. EEG confirmed total sleep time was 248.3 minutes yielding a sleep efficiency of 66.8%. Sleep onset after lights out was 0.0 minutes with a REM latency of 35.5 minutes. The patient spent 18.53% of the night in stage N1 sleep, 57.11% in stage N2  sleep, 0.20% in stage N3 and 24.16% in REM. Wake after sleep onset (WASO) was 123.5 minutes. The Arousal Index was 40.6/hour.  LEG MOVEMENT DATA The total Periodic Limb Movements of Sleep (PLMS) were 10. The PLMS index was 2.42 .  CARDIAC DATA The 2 lead EKG demonstrated sinus rhythm. The mean heart rate was 112.69 beats per minute. Other EKG findings include: None.  IMPRESSIONS - Severe obstructive sleep apnea occurred during the diagnostic portion of the study (AHI = 96.3 /hour). - CPAP gave inadequate control, with appearance of Central Apneas. An optimal BiPAP pressure was selected for this patient ( 20 /16  cm of water) - Mild central sleep apnea occurred during the diagnostic portion of the study (CAI = 5.9/hour). - Moderate oxygen desaturation was noted during the diagnostic portion of the study (Min O2 = 52.00%). - The patient snored with Loud snoring volume during the diagnostic portion of the study. - No cardiac abnormalities were noted during this study. - Clinically significant periodic limb movements of sleep did not occur during the study.  DIAGNOSIS - Obstructive Sleep Apnea (327.23 [G47.33 ICD-10])  RECOMMENDATIONS - Trial of BiPAP therapy on 20/16 cm H2O with a Medium size Fisher&Paykel Full Face Mask Simplus mask and heated humidification. - Avoid alcohol, sedatives and other CNS depressants that may worsen sleep apnea and disrupt normal sleep architecture. - Sleep hygiene should be reviewed to assess factors that may improve sleep quality. - Weight management and regular exercise should be initiated or continued.  [Electronically signed] 06/07/2016 02:39 PM  Baird Lyons MD, [place], American Board of Sleep Medicine   NPI: [id number]  [place], American Board of Sleep Medicine  ELECTRONICALLY  SIGNED ON:  06/07/2016, 2:36 PM [place] PH: [phone]   FX: [phone] [place]

## 2016-06-08 LAB — HIV-1 RNA ULTRAQUANT REFLEX TO GENTYP+
HIV-1 RNA BY PCR: 175000 copies/mL
HIV-1 RNA Quant, Log: 5.243 log10copy/mL

## 2016-06-12 ENCOUNTER — Emergency Department (HOSPITAL_COMMUNITY): Payer: Medicare Other

## 2016-06-12 ENCOUNTER — Non-Acute Institutional Stay (HOSPITAL_COMMUNITY)
Admission: EM | Admit: 2016-06-12 | Discharge: 2016-06-12 | Disposition: A | Discharge status: Home / Self Care | Payer: Medicare Other | Attending: Emergency Medicine | Admitting: Emergency Medicine

## 2016-06-12 ENCOUNTER — Encounter (HOSPITAL_COMMUNITY): Payer: Self-pay | Admitting: Emergency Medicine

## 2016-06-12 DIAGNOSIS — I509 Heart failure, unspecified: Secondary | ICD-10-CM | POA: Insufficient documentation

## 2016-06-12 DIAGNOSIS — N186 End stage renal disease: Secondary | ICD-10-CM | POA: Insufficient documentation

## 2016-06-12 DIAGNOSIS — G4733 Obstructive sleep apnea (adult) (pediatric): Secondary | ICD-10-CM | POA: Insufficient documentation

## 2016-06-12 DIAGNOSIS — G40909 Epilepsy, unspecified, not intractable, without status epilepticus: Secondary | ICD-10-CM | POA: Insufficient documentation

## 2016-06-12 DIAGNOSIS — J810 Acute pulmonary edema: Secondary | ICD-10-CM | POA: Diagnosis not present

## 2016-06-12 DIAGNOSIS — Z79899 Other long term (current) drug therapy: Secondary | ICD-10-CM | POA: Insufficient documentation

## 2016-06-12 DIAGNOSIS — I132 Hypertensive heart and chronic kidney disease with heart failure and with stage 5 chronic kidney disease, or end stage renal disease: Secondary | ICD-10-CM | POA: Insufficient documentation

## 2016-06-12 DIAGNOSIS — B20 Human immunodeficiency virus [HIV] disease: Secondary | ICD-10-CM | POA: Insufficient documentation

## 2016-06-12 DIAGNOSIS — F329 Major depressive disorder, single episode, unspecified: Secondary | ICD-10-CM | POA: Insufficient documentation

## 2016-06-12 DIAGNOSIS — E669 Obesity, unspecified: Secondary | ICD-10-CM | POA: Insufficient documentation

## 2016-06-12 DIAGNOSIS — Z6826 Body mass index (BMI) 26.0-26.9, adult: Secondary | ICD-10-CM | POA: Insufficient documentation

## 2016-06-12 DIAGNOSIS — Z86718 Personal history of other venous thrombosis and embolism: Secondary | ICD-10-CM | POA: Insufficient documentation

## 2016-06-12 DIAGNOSIS — J45909 Unspecified asthma, uncomplicated: Secondary | ICD-10-CM | POA: Insufficient documentation

## 2016-06-12 DIAGNOSIS — R0602 Shortness of breath: Secondary | ICD-10-CM | POA: Insufficient documentation

## 2016-06-12 DIAGNOSIS — R0600 Dyspnea, unspecified: Secondary | ICD-10-CM | POA: Diagnosis present

## 2016-06-12 LAB — COMPREHENSIVE METABOLIC PANEL
ALT: 18 U/L (ref 17–63)
AST: 39 U/L (ref 15–41)
Albumin: 3.2 g/dL — ABNORMAL LOW (ref 3.5–5.0)
Alkaline Phosphatase: 46 U/L (ref 38–126)
Anion gap: 17 — ABNORMAL HIGH (ref 5–15)
BUN: 70 mg/dL — ABNORMAL HIGH (ref 6–20)
CO2: 23 mmol/L (ref 22–32)
Calcium: 8.6 mg/dL — ABNORMAL LOW (ref 8.9–10.3)
Chloride: 93 mmol/L — ABNORMAL LOW (ref 101–111)
Creatinine, Ser: 11.25 mg/dL — ABNORMAL HIGH (ref 0.61–1.24)
GFR calc Af Amer: 6 mL/min — ABNORMAL LOW (ref >60)
GFR calc non Af Amer: 5 mL/min — ABNORMAL LOW (ref >60)
Glucose, Bld: 116 mg/dL — ABNORMAL HIGH (ref 65–99)
Potassium: 5.6 mmol/L — ABNORMAL HIGH (ref 3.5–5.1)
Sodium: 133 mmol/L — ABNORMAL LOW (ref 135–145)
Total Bilirubin: 0.8 mg/dL (ref 0.3–1.2)
Total Protein: 7.5 g/dL (ref 6.5–8.1)

## 2016-06-12 LAB — I-STAT CHEM 8, ED
BUN: 59 mg/dL — ABNORMAL HIGH (ref 6–20)
Calcium, Ion: 1.02 mmol/L — ABNORMAL LOW (ref 1.15–1.40)
Chloride: 98 mmol/L — ABNORMAL LOW (ref 101–111)
Creatinine, Ser: 12.7 mg/dL — ABNORMAL HIGH (ref 0.61–1.24)
Glucose, Bld: 121 mg/dL — ABNORMAL HIGH (ref 65–99)
HCT: 25 % — ABNORMAL LOW (ref 39.0–52.0)
Hemoglobin: 8.5 g/dL — ABNORMAL LOW (ref 13.0–17.0)
Potassium: 5.6 mmol/L — ABNORMAL HIGH (ref 3.5–5.1)
Sodium: 135 mmol/L (ref 135–145)
TCO2: 26 mmol/L (ref 0–100)

## 2016-06-12 LAB — CBC WITH DIFFERENTIAL/PLATELET
Basophils Absolute: 0 10*3/uL (ref 0.0–0.1)
Basophils Relative: 0 %
Eosinophils Absolute: 0 10*3/uL (ref 0.0–0.7)
Eosinophils Relative: 0 %
HCT: 25.4 % — ABNORMAL LOW (ref 39.0–52.0)
Hemoglobin: 8.5 g/dL — ABNORMAL LOW (ref 13.0–17.0)
Lymphocytes Relative: 21 %
Lymphs Abs: 0.6 10*3/uL — ABNORMAL LOW (ref 0.7–4.0)
MCH: 31.5 pg (ref 26.0–34.0)
MCHC: 33.5 g/dL (ref 30.0–36.0)
MCV: 94.1 fL (ref 78.0–100.0)
Monocytes Absolute: 0.1 10*3/uL (ref 0.1–1.0)
Monocytes Relative: 2 %
Neutro Abs: 2.1 10*3/uL (ref 1.7–7.7)
Neutrophils Relative %: 77 %
Platelets: 134 10*3/uL — ABNORMAL LOW (ref 150–400)
RBC: 2.7 MIL/uL — ABNORMAL LOW (ref 4.22–5.81)
RDW: 19.7 % — ABNORMAL HIGH (ref 11.5–15.5)
WBC: 2.7 10*3/uL — ABNORMAL LOW (ref 4.0–10.5)

## 2016-06-12 LAB — I-STAT TROPONIN, ED: Troponin i, poc: 0.13 ng/mL (ref 0.00–0.08)

## 2016-06-12 LAB — LACTATE DEHYDROGENASE: LDH: 362 U/L — ABNORMAL HIGH (ref 98–192)

## 2016-06-12 MED ORDER — LIDOCAINE HCL (PF) 1 % IJ SOLN: 5.0000 mL | INTRAMUSCULAR | Status: DC | PRN

## 2016-06-12 MED ORDER — ALBUTEROL SULFATE (2.5 MG/3ML) 0.083% IN NEBU
5.0000 mg | INHALATION_SOLUTION | Freq: Once | RESPIRATORY_TRACT | Status: AC
  Administered 2016-06-12: 5 mg via RESPIRATORY_TRACT
  Filled 2016-06-12: qty 6

## 2016-06-12 MED ORDER — ACETAMINOPHEN 325 MG PO TABS
650.0000 mg | ORAL_TABLET | Freq: Four times a day (QID) | ORAL | Status: DC | PRN
  Administered 2016-06-12: 650 mg via ORAL

## 2016-06-12 MED ORDER — SODIUM CHLORIDE 0.9 % IV SOLN: 100.0000 mL | INTRAVENOUS | Status: DC | PRN

## 2016-06-12 MED ORDER — LIDOCAINE-PRILOCAINE 2.5-2.5 % EX CREA: 1.0000 "application " | TOPICAL_CREAM | CUTANEOUS | Status: DC | PRN

## 2016-06-12 MED ORDER — PENTAFLUOROPROP-TETRAFLUOROETH EX AERO: 1.0000 "application " | INHALATION_SPRAY | CUTANEOUS | Status: DC | PRN

## 2016-06-12 MED ORDER — HEPARIN SODIUM (PORCINE) 1000 UNIT/ML DIALYSIS: 1000.0000 [IU] | INTRAMUSCULAR | Status: DC | PRN

## 2016-06-12 MED ORDER — ALTEPLASE 2 MG IJ SOLR: 2.0000 mg | Freq: Once | INTRAMUSCULAR | Status: DC | PRN

## 2016-06-12 MED ORDER — HYDRALAZINE HCL 20 MG/ML IJ SOLN
10.0000 mg | INTRAMUSCULAR | Status: DC | PRN
  Administered 2016-06-12: 10 mg via INTRAVENOUS

## 2016-06-12 MED ORDER — ACETAMINOPHEN 325 MG PO TABS
ORAL_TABLET | ORAL | Status: AC
  Administered 2016-06-12: 650 mg via ORAL
  Filled 2016-06-12: qty 2

## 2016-06-12 MED ORDER — HYDRALAZINE HCL 20 MG/ML IJ SOLN
INTRAMUSCULAR | Status: AC
Start: 2016-06-12 — End: 2016-06-12
  Administered 2016-06-12: 10 mg via INTRAVENOUS
  Filled 2016-06-12: qty 1

## 2016-06-12 NOTE — Progress Notes (Signed)
Pt dc'd after HD tx complete.  Tolerated tx well until cramping with 35mins left.  Pt signed off machine early, however did remove 4146ml of fluid and cramping resolved after rinseback.  Pt taken to dc area per WC.  Stable to home.  Instructed to go to regular HD tx tomorrow at his outpt center.

## 2016-06-12 NOTE — ED Provider Notes (Signed)
[facility] DEPT Provider Note   CSN: [id number] Arrival date & time: 06/12/16  6160     History   Chief Complaint Chief Complaint  Patient presents with  . Shortness of Breath    HPI Jim Wyatt is a 36 y.o. male.  36 yo M with a chief complaint of shortness of breath. The patient will go with this this morning. Feels like he is having significant issues with it. Was just admitted for the same. He thinks it's most likely fluid. At the time of his admission is also thought that maybe he had PCP pneumonia. He is having a cough denies production. Is only able to make it if he steps at a time without becoming significantly short of breath. He is about 10 pounds over his dry weight. Unsure of fevers or chills.   The history is provided by the patient.  Shortness of Breath  This is a recurrent problem. The average episode lasts 3 hours. The problem occurs frequently.The current episode started 3 to 5 hours ago. The problem has not changed since onset.Associated symptoms include cough (dry). Pertinent negatives include no fever, no headaches, no chest pain, no vomiting, no abdominal pain and no rash. He has tried nothing for the symptoms. The treatment provided no relief. He has had prior hospitalizations. He has had prior ED visits. He has had no prior ICU admissions. Associated medical issues include heart failure.    Past Medical History:  Diagnosis Date  . Asthma   . CAP (community acquired pneumonia) 10/2013  . Cellulitis 11/2013   LLE  . DVT (deep venous thrombosis) ([place]) 10/2013   BLE  . 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  Went on HD Dec 2016.   [name] Fever   . Headache   . HIV infection ([place]) dx'd 10/2013  . Hypertension   . Leg pain   . Multiple environmental allergies    "trees, dogs,  cats"  . Multiple food allergies   . Shingles < 2010    Patient Active Problem List   Diagnosis Date Noted  . ESRD on hemodialysis ([place])   . Acute pulmonary edema (HCC)   . SIRS (systemic inflammatory response syndrome) ([place]) 05/30/2016  . Acute respiratory failure ([place]) 05/30/2016  . 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 Noncompliance   . End stage renal disease ([place])   . Chest pressure   . Palliative care encounter   . Goals of care, counseling/discussion   . Shortness of breath 05/12/2016  . Dyspnea   . Hypertensive emergency   . Pericardial effusion   . Tachycardia   . HIV disease ([place])   . OSA (obstructive sleep apnea) 04/19/2016  . Hospital discharge follow-up 04/19/2016  . Depression 03/24/2016  . Breast lump or mass   . Rectal bleeding   . GI bleed 03/15/2016  . Hyperkalemia   . New onset seizure ([place]) 03/13/2016  . Symptomatic anemia 02/01/2016  . Dysuria 01/18/2016  . Hematochezia 01/09/2016  . Postoperative anemia due to acute blood loss 01/09/2016  . Seizure disorder ([place]) 12/29/2015  . Intussusception ([place]) 12/28/2015  . Hypertension 12/28/2015  . Otitis externa of both ears 11/21/2015  . AIDS (acquired immune deficiency syndrome) ([place])   . Meningitis   . Cephalalgia   . Blurry  vision, bilateral   . Headache   . Hypertensive urgency   . New onset of headache in immunocompromised patient ([place]) 07/06/2015  . ESRD on dialysis ([place]) 04/27/2015  . CAP (community acquired pneumonia) 02/25/2015  . ESRD (end stage renal disease) ([name]) 02/16/2015  . History of DVT (deep vein thrombosis) 01/12/2015  . Candidal esophagitis ([place])   . Constipation   . Nausea without vomiting 12/20/2014  . Abscess of right thigh 12/19/2014  . Chronic kidney disease (CKD), stage IV (severe) ([place]) 12/19/2014  . 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 Anemia due to end stage renal disease ([place]) 12/19/2014  . Hyponatremia 12/19/2014  . AIDS ([place])     . Community acquired pneumonia 07/30/2014  . Anemia of chronic disease 07/30/2014  . Acute on chronic renal failure ([place])   . 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 CMV (cytomegalovirus infection) ([place]) 12/07/2013  . Protein-calorie malnutrition, severe ([place]) 12/06/2013  . 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 DVT (deep venous thrombosis) ([place]) 11/24/2013  . Asthma, chronic 11/24/2013  . Acquired immune deficiency syndrome ([place]) 11/14/2013  . AKI (acute kidney injury) ([place]) 11/11/2013  . Elevated liver enzymes 11/09/2013  . History of shingles 11/09/2013  . Obesity 11/09/2013  . Abdominal pain 11/07/2013  . Asthma     Past Surgical History:  Procedure Laterality Date  . AV FISTULA PLACEMENT Left 03/03/2015   Procedure: Creation of Left Arm RADIOCEPHALIC ARTERIOVENOUS FISTULA ;  Surgeon: Mal Misty, MD;  Location: [facility];  Service: Vascular;  Laterality: Left;  . COLONOSCOPY N/A 03/18/2016   Procedure: COLONOSCOPY;  Surgeon: Manus Gunning, MD;  Location: [facility];  Service: Gastroenterology;  Laterality: N/A;  . FEMUR FRACTURE SURGERY Right 09/2001  . FRACTURE SURGERY    . I&D EXTREMITY Right 12/18/2014   Procedure: IRRIGATION AND DEBRIDEMENT RIGHT LOWER EXTREMITY;  Surgeon: Ralene Ok, MD;  Location: [facility];  Service: General;  Laterality: Right;  . INSERTION OF DIALYSIS CATHETER  2016  . LAPAROTOMY N/A 12/28/2015   Procedure: EXPLORATORY LAPAROTOMY BOWEL RESECTION ILEOCECECTOMY;  Surgeon: Clovis Riley, MD;  Location: [facility];  Service: General;  Laterality: N/A;       Home Medications    Prior to Admission medications   Medication Sig Start Date End Date Taking? Authorizing Provider  acetaminophen (TYLENOL) 500 MG tablet Take 1,000 mg by mouth every 6 (six) hours as needed (pain).    Historical Provider, MD  albuterol (PROVENTIL HFA;VENTOLIN HFA) 108 (90 Base) MCG/ACT inhaler Inhale 2 puffs into the  lungs every 6 (six) hours as needed for wheezing or shortness of breath. 04/19/16   [name], MD  Ascorbic Acid (VITAMIN C) 250 MG CHEW Chew 500 mg by mouth daily.     Historical Provider, MD  clindamycin (CLEOCIN) 300 MG capsule Take 2 capsules (600 mg total) by mouth every 8 (eight) hours. 06/02/16 06/21/16  [place] Bing, DO  dolutegravir (TIVICAY) 50 MG tablet Take 1 tablet (50 mg total) by mouth daily. 06/02/16   Truman Hayward, MD  emtricitabine-tenofovir AF (DESCOVY) 200-25 MG tablet Take 1 tablet by mouth daily. 06/02/16   Truman Hayward, MD  hydrALAZINE (APRESOLINE) 25 MG tablet Take 3 tablets (75 mg total) by mouth 3 (three) times daily. 05/21/16   Eloise Levels, MD  levETIRAcetam (KEPPRA) 250 MG tablet Take 1 tablet (250 mg) by mouth on Tuesday, Thursday, Saturday at 7pm Patient taking differently: Take 250-500 mg by mouth  See admin instructions. takes extra 250mg  on dialysis days Wallie Char, Sunday 03/13/16   Cameron Sprang, MD  levETIRAcetam (KEPPRA) 500 MG tablet Take 1 tablet (500 mg total) by mouth daily. 03/21/16   Burgess Estelle, MD  losartan (COZAAR) 100 MG tablet Take 100 mg by mouth at bedtime.    Historical Provider, MD  predniSONE (DELTASONE) 20 MG tablet Take 2 tab before bedtime on 4/6, then 2 tabs twice daily for 1 day, then 2 tabs daily for 5 days, then 1 tab daily for 11 days 06/02/16   Craig Bing, DO  primaquine 26.3 MG tablet Take 2 tablets (30 mg total) by mouth daily. 06/03/16 06/21/16  Heber Bing, DO  topiramate (TOPAMAX) 50 MG tablet Take 1 tablet (50 mg total) by mouth 2 (two) times daily. 05/21/16   Eloise Levels, MD  venlafaxine XR (EFFEXOR-XR) 150 MG 24 hr capsule TAKE 1 CAPSULE(150 MG) BY MOUTH DAILY Patient not taking: Reported on 05/12/2016 04/19/16   Holley Raring, MD    Family History Family History  Problem Relation Age of Onset  . Hypertension Mother   . Diabetes Maternal Grandmother   . Kidney disease Maternal Grandmother      Social History Social History  Substance Use Topics  . Smoking status: Never Smoker  . Smokeless tobacco: Never Used  . Alcohol use No     Comment: I stopped drinking along time ago "     Allergies   Bactrim [sulfamethoxazole-trimethoprim]; Reglan [metoclopramide]; Vancomycin; Metoprolol; Amlodipine; Augmentin [amoxicillin-pot clavulanate]; and Procardia [nifedipine]   Review of Systems Review of Systems  Constitutional: Negative for chills and fever.  HENT: Negative for congestion and facial swelling.   Eyes: Negative for discharge and visual disturbance.  Respiratory: Positive for cough (dry) and shortness of breath.   Cardiovascular: Negative for chest pain and palpitations.  Gastrointestinal: Negative for abdominal pain, diarrhea and vomiting.  Musculoskeletal: Negative for arthralgias and myalgias.  Skin: Negative for color change and rash.  Neurological: Negative for tremors, syncope and headaches.  Psychiatric/Behavioral: Negative for confusion and dysphoric mood.     Physical Exam Updated Vital Signs BP (!) 202/126   Pulse (!) 106   Temp 97.9 F (36.6 C) (Oral)   Resp 20   Ht 5\' 7"  (1.702 m)   Wt 158 lb 1.1 oz (71.7 kg)   SpO2 98%   BMI 24.76 kg/m   Physical Exam  Constitutional: He is oriented to person, place, and time. He appears well-developed and well-nourished.  HENT:  Head: Normocephalic and atraumatic.  Eyes: EOM are normal. Pupils are equal, round, and reactive to light.  Neck: Normal range of motion. Neck supple. JVD (to mid neck) present.  Cardiovascular: Normal rate and regular rhythm.  Exam reveals no gallop and no friction rub.   No murmur heard. Pulmonary/Chest: No respiratory distress. He has no wheezes. He has rales (just below tip of scapula).  Abdominal: He exhibits no distension and no mass. There is no tenderness. There is no rebound and no guarding.  Musculoskeletal: Normal range of motion.  Neurological: He is alert and  oriented to person, place, and time.  Skin: No rash noted. No pallor.  Psychiatric: He has a normal mood and affect. His behavior is normal.  Nursing note and vitals reviewed.    ED Treatments / Results  Labs (all labs ordered are listed, but only abnormal results are displayed) Labs Reviewed  CBC WITH DIFFERENTIAL/PLATELET - Abnormal; Notable for the following:  Result Value   WBC 2.7 (*)    RBC 2.70 (*)    Hemoglobin 8.5 (*)    HCT 25.4 (*)    RDW 19.7 (*)    Platelets 134 (*)    Lymphs Abs 0.6 (*)    All other components within normal limits  COMPREHENSIVE METABOLIC PANEL - Abnormal; Notable for the following:    Sodium 133 (*)    Potassium 5.6 (*)    Chloride 93 (*)    Glucose, Bld 116 (*)    BUN 70 (*)    Creatinine, Ser 11.25 (*)    Calcium 8.6 (*)    Albumin 3.2 (*)    GFR calc non Af Amer 5 (*)    GFR calc Af Amer 6 (*)    Anion gap 17 (*)    All other components within normal limits  LACTATE DEHYDROGENASE - Abnormal; Notable for the following:    LDH 362 (*)    All other components within normal limits  I-STAT CHEM 8, ED - Abnormal; Notable for the following:    Potassium 5.6 (*)    Chloride 98 (*)    BUN 59 (*)    Creatinine, Ser 12.70 (*)    Glucose, Bld 121 (*)    Calcium, Ion 1.02 (*)    Hemoglobin 8.5 (*)    HCT 25.0 (*)    All other components within normal limits  I-STAT TROPOININ, ED - Abnormal; Notable for the following:    Troponin i, poc 0.13 (*)    All other components within normal limits    EKG  EKG Interpretation  Date/Time:  Monday June 12 2016 06:36:14 EDT Ventricular Rate:  107 PR Interval:    QRS Duration: 90 QT Interval:  373 QTC Calculation: 498 R Axis:   42 Text Interpretation:  Sinus tachycardia Probable left atrial enlargement Borderline repolarization abnormality Confirmed by [facility]  MD, Emmaline Kluver ([id number]) on 06/12/2016 6:38:38 AM Also confirmed by [facility]  MD, APRIL ([id number]), editor Drema Pry  [phone])  on 06/12/2016 8:31:53 AM       Radiology Dg Chest 2 View  Result Date: 06/12/2016 CLINICAL DATA:  36 year old male with shortness breath. Dialysis. Recent diagnosis of pneumonia. HIV infection. Subsequent encounter. EXAM: CHEST  2 VIEW COMPARISON:  05/30/2016 chest CT.  05/30/2016 chest x-ray. FINDINGS: Cardiomegaly Pulmonary vascular congestion/ mild pulmonary edema. Suggestion of very small pleural effusions. Interstitial infiltrates felt to be less likely consideration. No pneumothorax. Cardiomegaly. IMPRESSION: Cardiomegaly. Pulmonary vascular congestion/mild pulmonary edema. Interstitial infiltrates felt to be less likely consideration (to be considered in proper clinical setting as patient may be immunocompromised). Electronically Signed   By: Genia Del M.D.   On: 06/12/2016 07:12    Procedures Procedures (including critical care time)  Medications Ordered in ED Medications  albuterol (PROVENTIL) (2.5 MG/3ML) 0.083% nebulizer solution 5 mg (5 mg Nebulization Given 06/12/16 0640)     Initial Impression / Assessment and Plan / ED Course  I have reviewed the triage vital signs and the nursing notes.  Pertinent labs & imaging results that were available during my care of the patient were reviewed by me and considered in my medical decision making (see chart for details).     36 yo M With a chief complaint of shortness breath. This started this morning. Patient thinks it's fluid overload. He was just discharged from the hospital with the concern for PCP pneumonia. With his HIV status since potentially hard to differentiate between the 2. I will discuss  with nephrology.  Nephrology to eval at bedside. Will dialyze and dc home.   The patients results and plan were reviewed and discussed.   Any x-rays performed were independently reviewed by myself.   Differential diagnosis were considered with the presenting HPI.  Medications  albuterol (PROVENTIL) (2.5 MG/3ML) 0.083%  nebulizer solution 5 mg (5 mg Nebulization Given 06/12/16 0640)    Vitals:   06/12/16 1200 06/12/16 1230 06/12/16 1300 06/12/16 1336  BP: (!) 202/113 (!) 208/129 (!) 193/101 (!) 202/126  Pulse: (!) 111 (!) 116 (!) 101 (!) 106  Resp:  20 20 20   Temp:    97.9 F (36.6 C)  TempSrc:    Oral  SpO2: 98% 98% 100% 98%  Weight:    158 lb 1.1 oz (71.7 kg)  Height:        Final diagnoses:  Acute pulmonary edema (HCC)    Final Clinical Impressions(s) / ED Diagnoses   Final diagnoses:  Acute pulmonary edema [facility])    New Prescriptions Discharge Medication List as of 06/12/2016  2:50 PM       Deno Etienne, DO 06/12/16 2017

## 2016-06-12 NOTE — ED Notes (Signed)
Stopped breathing treatment.  Patient noted to be more SOB with it going and restless.

## 2016-06-12 NOTE — ED Notes (Signed)
Taken to xray.

## 2016-06-12 NOTE — ED Notes (Signed)
Patient given ice pack for comfort after patient stated he felt uncomfortably warm.

## 2016-06-12 NOTE — Progress Notes (Signed)
Asked to see patient for SOB and need for HD.  Pt presented with SOB x 24 hrs, next OP HD would be Tuesday, this is MOnday.  CXR vasc congestion, exam benign, no rales or wheezing.  RR 30's > improved to 18 / min in ED with O2.  They did standing wt and he is 76 kg, which is 11.5kg over dry wt.  Suspect this is primarily vol overload related.  Recommend HD asap upstairs and ok to dc home from my standpoint after HD completed.  He knows he is to resume usual TTS schedule w HD tomorrow and strictly limit fluid intake to the best of his ability.    Kelly Splinter MD [employer] pgr [phone]   06/12/2016, 9:08 AM

## 2016-06-12 NOTE — ED Notes (Signed)
Applied 2L of O2

## 2016-06-12 NOTE — ED Triage Notes (Signed)
Patient reports being in hospital two weeks ago with pneumonia.  Pt feels like SOB is worse and that he has too much fluid on board.  Weighed in triage and patient has gained weight.  Visibly SOB in triage.  Tues, Thurs, Sat.  Went on Saturday and completed full treatment.  Also c/o headache.

## 2016-06-12 NOTE — ED Notes (Signed)
Breakfast tray at bedside 

## 2016-06-15 LAB — GENOSURE INTEGRASE HIV EDI

## 2016-06-16 LAB — REFLEX TO GENOSURE(R) MG

## 2016-06-17 ENCOUNTER — Encounter (HOSPITAL_COMMUNITY): Payer: Self-pay | Admitting: *Deleted

## 2016-06-17 ENCOUNTER — Emergency Department (HOSPITAL_COMMUNITY): Payer: Medicare Other

## 2016-06-17 ENCOUNTER — Inpatient Hospital Stay (HOSPITAL_COMMUNITY)
Admission: EM | Admit: 2016-06-17 | Discharge: 2016-06-22 | DRG: 974 | Disposition: A | Discharge status: Home / Self Care | Payer: Medicare Other | Attending: Family Medicine | Admitting: Family Medicine

## 2016-06-17 DIAGNOSIS — I160 Hypertensive urgency: Secondary | ICD-10-CM | POA: Diagnosis not present

## 2016-06-17 DIAGNOSIS — I150 Renovascular hypertension: Secondary | ICD-10-CM | POA: Diagnosis present

## 2016-06-17 DIAGNOSIS — N039 Chronic nephritic syndrome with unspecified morphologic changes: Secondary | ICD-10-CM | POA: Diagnosis present

## 2016-06-17 DIAGNOSIS — Z8619 Personal history of other infectious and parasitic diseases: Secondary | ICD-10-CM

## 2016-06-17 DIAGNOSIS — Z86718 Personal history of other venous thrombosis and embolism: Secondary | ICD-10-CM

## 2016-06-17 DIAGNOSIS — E8779 Other fluid overload: Secondary | ICD-10-CM | POA: Diagnosis not present

## 2016-06-17 DIAGNOSIS — R51 Headache: Secondary | ICD-10-CM | POA: Diagnosis present

## 2016-06-17 DIAGNOSIS — N2581 Secondary hyperparathyroidism of renal origin: Secondary | ICD-10-CM | POA: Diagnosis present

## 2016-06-17 DIAGNOSIS — R109 Unspecified abdominal pain: Secondary | ICD-10-CM | POA: Diagnosis present

## 2016-06-17 DIAGNOSIS — K529 Noninfective gastroenteritis and colitis, unspecified: Secondary | ICD-10-CM

## 2016-06-17 DIAGNOSIS — B59 Pneumocystosis: Secondary | ICD-10-CM | POA: Diagnosis present

## 2016-06-17 DIAGNOSIS — J45909 Unspecified asthma, uncomplicated: Secondary | ICD-10-CM | POA: Diagnosis present

## 2016-06-17 DIAGNOSIS — Z9119 Patient's noncompliance with other medical treatment and regimen: Secondary | ICD-10-CM | POA: Diagnosis not present

## 2016-06-17 DIAGNOSIS — J9600 Acute respiratory failure, unspecified whether with hypoxia or hypercapnia: Secondary | ICD-10-CM | POA: Diagnosis present

## 2016-06-17 DIAGNOSIS — J811 Chronic pulmonary edema: Secondary | ICD-10-CM | POA: Diagnosis present

## 2016-06-17 DIAGNOSIS — Z8661 Personal history of infections of the central nervous system: Secondary | ICD-10-CM

## 2016-06-17 DIAGNOSIS — G4733 Obstructive sleep apnea (adult) (pediatric): Secondary | ICD-10-CM | POA: Diagnosis present

## 2016-06-17 DIAGNOSIS — Z992 Dependence on renal dialysis: Secondary | ICD-10-CM | POA: Diagnosis not present

## 2016-06-17 DIAGNOSIS — E8770 Fluid overload, unspecified: Secondary | ICD-10-CM | POA: Diagnosis not present

## 2016-06-17 DIAGNOSIS — D631 Anemia in chronic kidney disease: Secondary | ICD-10-CM | POA: Diagnosis present

## 2016-06-17 DIAGNOSIS — R000 Tachycardia, unspecified: Secondary | ICD-10-CM | POA: Diagnosis present

## 2016-06-17 DIAGNOSIS — B20 Human immunodeficiency virus [HIV] disease: Secondary | ICD-10-CM | POA: Diagnosis not present

## 2016-06-17 DIAGNOSIS — Z79899 Other long term (current) drug therapy: Secondary | ICD-10-CM

## 2016-06-17 DIAGNOSIS — I313 Pericardial effusion (noninflammatory): Secondary | ICD-10-CM | POA: Diagnosis present

## 2016-06-17 DIAGNOSIS — R601 Generalized edema: Secondary | ICD-10-CM | POA: Diagnosis not present

## 2016-06-17 DIAGNOSIS — N186 End stage renal disease: Secondary | ICD-10-CM | POA: Diagnosis not present

## 2016-06-17 DIAGNOSIS — F329 Major depressive disorder, single episode, unspecified: Secondary | ICD-10-CM | POA: Diagnosis present

## 2016-06-17 DIAGNOSIS — G40909 Epilepsy, unspecified, not intractable, without status epilepticus: Secondary | ICD-10-CM | POA: Diagnosis present

## 2016-06-17 DIAGNOSIS — R0789 Other chest pain: Secondary | ICD-10-CM | POA: Diagnosis present

## 2016-06-17 LAB — COMPREHENSIVE METABOLIC PANEL
ALT: 17 U/L (ref 17–63)
AST: 33 U/L (ref 15–41)
Albumin: 3.4 g/dL — ABNORMAL LOW (ref 3.5–5.0)
Alkaline Phosphatase: 44 U/L (ref 38–126)
Anion gap: 17 — ABNORMAL HIGH (ref 5–15)
BUN: 87 mg/dL — ABNORMAL HIGH (ref 6–20)
CO2: 21 mmol/L — ABNORMAL LOW (ref 22–32)
Calcium: 11 mg/dL — ABNORMAL HIGH (ref 8.9–10.3)
Chloride: 95 mmol/L — ABNORMAL LOW (ref 101–111)
Creatinine, Ser: 11.34 mg/dL — ABNORMAL HIGH (ref 0.61–1.24)
GFR calc Af Amer: 6 mL/min — ABNORMAL LOW (ref >60)
GFR calc non Af Amer: 5 mL/min — ABNORMAL LOW (ref >60)
Glucose, Bld: 100 mg/dL — ABNORMAL HIGH (ref 65–99)
Potassium: 5.1 mmol/L (ref 3.5–5.1)
Sodium: 133 mmol/L — ABNORMAL LOW (ref 135–145)
Total Bilirubin: 1.1 mg/dL (ref 0.3–1.2)
Total Protein: 8 g/dL (ref 6.5–8.1)

## 2016-06-17 LAB — I-STAT TROPONIN, ED: Troponin i, poc: 0.12 ng/mL (ref 0.00–0.08)

## 2016-06-17 LAB — LIPASE, BLOOD: Lipase: 40 U/L (ref 11–51)

## 2016-06-17 LAB — CBC
HCT: 28.6 % — ABNORMAL LOW (ref 39.0–52.0)
Hemoglobin: 9.2 g/dL — ABNORMAL LOW (ref 13.0–17.0)
MCH: 30.3 pg (ref 26.0–34.0)
MCHC: 32.2 g/dL (ref 30.0–36.0)
MCV: 94.1 fL (ref 78.0–100.0)
Platelets: 253 10*3/uL (ref 150–400)
RBC: 3.04 MIL/uL — ABNORMAL LOW (ref 4.22–5.81)
RDW: 19.2 % — ABNORMAL HIGH (ref 11.5–15.5)
WBC: 8.8 10*3/uL (ref 4.0–10.5)

## 2016-06-17 LAB — I-STAT CG4 LACTIC ACID, ED: Lactic Acid, Venous: 1.7 mmol/L (ref 0.5–1.9)

## 2016-06-17 MED ORDER — LOSARTAN POTASSIUM 50 MG PO TABS
100.0000 mg | ORAL_TABLET | Freq: Every day | ORAL | Status: DC
  Administered 2016-06-17 – 2016-06-22 (×5): 100 mg via ORAL
  Filled 2016-06-17 (×5): qty 2

## 2016-06-17 MED ORDER — ACETAMINOPHEN 500 MG PO TABS
1000.0000 mg | ORAL_TABLET | Freq: Three times a day (TID) | ORAL | Status: DC | PRN
  Administered 2016-06-17 – 2016-06-18 (×2): 1000 mg via ORAL
  Filled 2016-06-17 (×2): qty 2

## 2016-06-17 MED ORDER — TOPIRAMATE 100 MG PO TABS
50.0000 mg | ORAL_TABLET | Freq: Two times a day (BID) | ORAL | Status: DC
  Administered 2016-06-17 – 2016-06-22 (×8): 50 mg via ORAL
  Filled 2016-06-17: qty 0.5
  Filled 2016-06-17 (×7): qty 1

## 2016-06-17 MED ORDER — ONDANSETRON HCL 4 MG/2ML IJ SOLN
4.0000 mg | Freq: Once | INTRAMUSCULAR | Status: AC
  Administered 2016-06-17: 4 mg via INTRAVENOUS
  Filled 2016-06-17: qty 2

## 2016-06-17 MED ORDER — PREDNISONE 20 MG PO TABS
20.0000 mg | ORAL_TABLET | Freq: Every day | ORAL | Status: DC
  Administered 2016-06-18 – 2016-06-22 (×5): 20 mg via ORAL
  Filled 2016-06-17 (×5): qty 1

## 2016-06-17 MED ORDER — LEVETIRACETAM 500 MG PO TABS
500.0000 mg | ORAL_TABLET | Freq: Every day | ORAL | Status: DC
  Administered 2016-06-18 – 2016-06-22 (×5): 500 mg via ORAL
  Filled 2016-06-17 (×6): qty 1

## 2016-06-17 MED ORDER — LEVETIRACETAM 250 MG PO TABS: 250.0000 mg | ORAL_TABLET | ORAL | Status: DC

## 2016-06-17 MED ORDER — HYDRALAZINE HCL 20 MG/ML IJ SOLN: 10.0000 mg | Freq: Once | INTRAMUSCULAR | Status: DC

## 2016-06-17 MED ORDER — HEPARIN SODIUM (PORCINE) 5000 UNIT/ML IJ SOLN
5000.0000 [IU] | Freq: Three times a day (TID) | INTRAMUSCULAR | Status: DC
  Filled 2016-06-17 (×5): qty 1

## 2016-06-17 MED ORDER — HYDRALAZINE HCL 20 MG/ML IJ SOLN
INTRAMUSCULAR | Status: AC
  Administered 2016-06-17: 20 mg via INTRAVENOUS
  Filled 2016-06-17: qty 1

## 2016-06-17 MED ORDER — PRIMAQUINE PHOSPHATE 26.3 MG PO TABS
30.0000 mg | ORAL_TABLET | Freq: Every day | ORAL | Status: DC
  Administered 2016-06-18 – 2016-06-21 (×2): 30 mg via ORAL
  Filled 2016-06-17 (×5): qty 2

## 2016-06-17 MED ORDER — HYDROMORPHONE HCL 1 MG/ML IJ SOLN
0.5000 mg | Freq: Four times a day (QID) | INTRAMUSCULAR | Status: DC | PRN
  Administered 2016-06-17 – 2016-06-19 (×3): 0.5 mg via INTRAVENOUS
  Filled 2016-06-17 (×2): qty 1

## 2016-06-17 MED ORDER — CLINDAMYCIN HCL 300 MG PO CAPS
600.0000 mg | ORAL_CAPSULE | Freq: Three times a day (TID) | ORAL | Status: DC
  Administered 2016-06-17 – 2016-06-22 (×12): 600 mg via ORAL
  Filled 2016-06-17 (×3): qty 2
  Filled 2016-06-17 (×2): qty 4
  Filled 2016-06-17: qty 2
  Filled 2016-06-17 (×3): qty 4
  Filled 2016-06-17 (×2): qty 2
  Filled 2016-06-17: qty 4
  Filled 2016-06-17 (×4): qty 2
  Filled 2016-06-17: qty 4

## 2016-06-17 MED ORDER — HYDRALAZINE HCL 50 MG PO TABS
75.0000 mg | ORAL_TABLET | Freq: Three times a day (TID) | ORAL | Status: DC
  Administered 2016-06-17: 75 mg via ORAL
  Filled 2016-06-17: qty 2

## 2016-06-17 MED ORDER — EMTRICITABINE-TENOFOVIR AF 200-25 MG PO TABS
1.0000 | ORAL_TABLET | Freq: Every day | ORAL | Status: DC
  Administered 2016-06-17 – 2016-06-21 (×5): 1 via ORAL
  Filled 2016-06-17 (×7): qty 1

## 2016-06-17 MED ORDER — IOPAMIDOL (ISOVUE-370) INJECTION 76%
INTRAVENOUS | Status: AC
  Administered 2016-06-17: 100 mL
  Filled 2016-06-17: qty 100

## 2016-06-17 MED ORDER — HYDRALAZINE HCL 20 MG/ML IJ SOLN
10.0000 mg | INTRAMUSCULAR | Status: DC | PRN
  Administered 2016-06-17 – 2016-06-21 (×7): 20 mg via INTRAVENOUS
  Filled 2016-06-17 (×6): qty 1

## 2016-06-17 MED ORDER — HYDROMORPHONE HCL 1 MG/ML IJ SOLN
1.0000 mg | Freq: Once | INTRAMUSCULAR | Status: AC
  Administered 2016-06-17: 1 mg via INTRAVENOUS
  Filled 2016-06-17: qty 1

## 2016-06-17 MED ORDER — LEVETIRACETAM 250 MG PO TABS
250.0000 mg | ORAL_TABLET | ORAL | Status: DC
  Administered 2016-06-17 – 2016-06-20 (×2): 250 mg via ORAL
  Filled 2016-06-17 (×2): qty 1

## 2016-06-17 MED ORDER — DOLUTEGRAVIR SODIUM 50 MG PO TABS
50.0000 mg | ORAL_TABLET | Freq: Every day | ORAL | Status: DC
  Administered 2016-06-17 – 2016-06-21 (×5): 50 mg via ORAL
  Filled 2016-06-17 (×8): qty 1

## 2016-06-17 NOTE — ED Notes (Signed)
Pt's family left contact number. Mother [phone]), cousin [phone])

## 2016-06-17 NOTE — H&P (Signed)
[facility] Admission History and Physical Service Pager: [phone]  Patient name: Jim Wyatt Medical record number: [id number] Date of birth: Dec 14, 1980 Age: 36 y.o. Gender: male  Primary Care Provider: [name], MD Consultants: Nephrology  Code Status: FULL  Jim Complaint: shortness of breath and abdominal pain  Assessment and Plan: Jim Wyatt is a 36 y.o. male presenting with dyspnea. PMH is significant for poorly controlled hypertension, headache, HIV with AIDS, end-stage renal disease on dialysis (T/TH/S) and history of DVT x 1 in 2015 (not on any anti-coagulation), recent diagnosis of PCP PNA, hx of ileocecal intussusception 11/2015.  Dyspnea: Acute onset of symptoms per patient. Vital signs unstable with HR 120s up to 147, respiratory rate in the 25 improved to 18, and blood pressures ranging from 217-186/155-128. On 4 L nasal cannula in HD, no O2 requirement at home but no documented desaturation. Physical exam with no significant tachypnea, slightly decreased lung sounds bilaterally with occasional rhonchi, no crackles or wheezes anteriorly. Likely due to volume overload. PE ruled out with CTA chest, but does show moderate pericardial effusion without evidence of tamponade. Reports of central chest pain. istat trop 0.12 which is lower than prior (and also in the setting of ESRD). Is currently being treated for PCP PNA (recent admission earlier this month). I do not think another infection is causing his symptoms.  Lactic acid normal.  - admit to stepdown, attending Dr. McDiarmid - currently in HD. Nephrology consulted - EKG, trend trop - will continue treatment for PCP PNA as noted below  PCP PNA: diagnosed at hospitalization early this month. Per outpatient pharamacy, patient filled medication on 4/15. - continue Prednisone taper: currently Prednisone 20mg  daily for 11 days starting 4/22 - continue Clindamycin 300mg  q 8hr and  Primaquine 30mg  daily (day 7/18 today)  Poorly Controlled HTN I renovascular HTN and tachycardia: blood pressures ranging from 217-186/155-128 in the setting of volume overload. HR 120s up to 147. Current home medications include Hydralazine 75 TID, Losartan 100mg  daily.  - getting HD - restart home medications Hydralazine 75 TID and Losartan 100mg  daily - Hydralazine 10mg  x1 and  Hydralazine 10-20mg  q 2 hr PRN ordered by nephrology - EKG - troponin - telemetry  Pericardial Effusion: in the setting of volume overload. No sign of cardiac tamponade - close monitoring - likely improve with HD  Abdominal pain and diarrhea: Noted to have this in the past when fluid overloaded. Is TTP around the periumbilical region the most but somewhat diffuse tender. Reports symptoms were similar to prior intussusception episode. LFTs and lipase unremarkable.  CT abdomen and pelvis: There are a few prominent loops of small bowel within the central abdomen measuring up to 3.5 cm. Remainder of the small bowel isdifficult to fully assess given lack of intraperitoneal fat. No definite abnormal bowel wall thickening. No free fluid or free intraperitoneal air. Abdominal pain possibly from fluid overload; considered peritoneal infection but no free fluid noted. Chronic diarrhea unclear in etiology - fecal lactoferin and GI pathogen panel and FOBT - Dilaudid 0.5mg  Q 6 PRN   End-stage renal disease: Receives dialysis on a T/TH/S. Patient reports he had extra HD this past week (Monday to Thursday). Nephrology consulted in the ED. - nephrology consulted;appreciate recommendations - AM renal function panel  Anemia of Chronic Disease: hgb stable today - monitor with labs  HIV disease/advanced AIDS: Last CD4 count 05/30/16 to 20 and HIV RNA Quant's, log 5.243 - continue home meds: Descovy and Tivicay  Seizure disorder: Taking keppra every day.  - continue keppra 500mg  daily and an additional 250mg  on dialysis days  (T/TH/S)  Possible OSA: Supposed to have sleep study. Does not use CPAP.  - f/u sleep study as an outpatient  Asthma: Takes albuterol when necessary at home. Denies tobacco.  - outpatient PFTs  Medical noncompliance: Patient refusing to take recommended antibiotics, antihypertensives, and BiPAP. Explained to patient the risks of refusing, he expresses understanding.  Hx of Headache: reports of mild HA currently without visual changes. Was recently admitted last month for headache with visual changes.  - home Topamax 50 BID - monitor - Tylenol PRN for now.   History of DVT: Bilateral 2015 9/15. Due to immobility. Completed course of anticoagulation. No LE tenderness or edema. CTA chest negative for PE FEN/GI: renal diet with fluid restriction Prophylaxis: subcutaneous heparin  History of Present Illness:  Jim Wyatt is a 36 y.o. male presenting with shortness of breath and abdominal pain. Patient reports that he started having shortness of breath, abdominal pain, and central chest pressure around 3AM which worsened around 7AM. Reports his abdominal pain is similar to his previous intussusception. Reports of orthopnea, no radiation of chest pain, and no associated diaphoresis or nausea. Reports he flew back from [place] this morning. He thought his CP was heart burn; he took Tums which helped a little. Reports he started coughing after coming to the ED; reports it is blood tinged. His last dialysis session was on Thursday; reports he had daily dialysis this past week (Monday to Thursday). Reports of chronic watery non-bloody diarrhea for the past 6 months; reports he has episodes about 6 times a day on average.   In the ED, patient noted to be tachycardic with elevated blood pressure. CXR with pulmonary vascular congestion. CTA chest showed no PE but showed patchy ground glass opacities throughout the lungs bilaterally, and moderate pericardial effusion. CT abdomen and pelvis showed  mild anasarca but overall unremarkable. He was directly taken for HD.   Review Of Systems:   Review of Systems  Constitutional: Negative for chills and fever.  HENT: Negative for congestion and sore throat.   Eyes: Negative for blurred vision and double vision.  Respiratory: Positive for cough and shortness of breath.   Cardiovascular: Positive for chest pain and orthopnea. Negative for palpitations.  Gastrointestinal: Positive for abdominal pain and diarrhea. Negative for blood in stool, constipation, melena, nausea and vomiting.  Musculoskeletal: Negative for falls.  Neurological: Positive for headaches. Negative for dizziness and focal weakness.  Psychiatric/Behavioral: Negative for depression. The patient is not nervous/anxious.     Patient Active Problem List   Diagnosis Date Noted  . ESRD on hemodialysis ([place])   . Acute pulmonary edema (H[facility])   . SIRS (systemic inflammatory response syndrome) ([place]) 05/30/2016  . Acute respiratory failure ([place]) 05/30/2016  . 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 Noncompliance   . End stage renal disease ([place])   . Chest pressure   . Palliative care encounter   . Goals of care, counseling/discussion   . Shortness of breath 05/12/2016  . Dyspnea   . Hypertensive emergency   . Pericardial effusion   . Tachycardia   . HIV disease ([place])   . OSA (obstructive sleep apnea) 04/19/2016  . Hospital discharge follow-up 04/19/2016  . Depression 03/24/2016  . Breast lump or mass   . Rectal bleeding   . GI bleed 03/15/2016  . Hyperkalemia   .  New onset seizure ([place]) 03/13/2016  . Symptomatic anemia 02/01/2016  . Dysuria 01/18/2016  . Hematochezia 01/09/2016  . Postoperative anemia due to acute blood loss 01/09/2016  . Seizure disorder ([place]) 12/29/2015  . Intussusception ([place]) 12/28/2015  . Hypertension 12/28/2015  . Otitis externa of both ears 11/21/2015  . AIDS (acquired immune deficiency syndrome) ([place])   .  Meningitis   . Cephalalgia   . Blurry vision, bilateral   . Headache   . Hypertensive urgency   . New onset of headache in immunocompromised patient ([place]) 07/06/2015  . ESRD on dialysis ([place]) 04/27/2015  . CAP (community acquired pneumonia) 02/25/2015  . ESRD (end stage renal disease) ([place]) 02/16/2015  . History of DVT (deep vein thrombosis) 01/12/2015  . Candidal esophagitis ([place])   . Constipation   . Nausea without vomiting 12/20/2014  . Abscess of right thigh 12/19/2014  . Chronic kidney disease (CKD), stage IV (severe) ([place]) 12/19/2014  . 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 Anemia due to end stage renal disease ([place]) 12/19/2014  . Hyponatremia 12/19/2014  . AIDS ([place])   . Community acquired pneumonia 07/30/2014  . Anemia of chronic disease 07/30/2014  . Acute on chronic renal failure ([place])   . 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 CMV (cytomegalovirus infection) ([place]) 12/07/2013  . Protein-calorie malnutrition, severe ([place]) 12/06/2013  . 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 DVT (deep venous thrombosis) ([place]) 11/24/2013  . Asthma, chronic 11/24/2013  . Acquired immune deficiency syndrome ([place]) 11/14/2013  . AKI (acute kidney injury) ([place]) 11/11/2013  . Elevated liver enzymes 11/09/2013  . History of shingles 11/09/2013  . Obesity 11/09/2013  . Abdominal pain 11/07/2013  . Asthma     Past Medical History: Past Medical History:  Diagnosis Date  . Asthma   . CAP (community acquired pneumonia) 10/2013  . Cellulitis 11/2013   LLE  . DVT (deep venous thrombosis) ([place]) 10/2013   BLE  . ESRD on hemodialysis ([place]) 01/2015   AKI with nephrotic syndrome (16gm proteinuria at time of biopsy, from collapsing FSG). 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  Went on HD Dec 2016.   [name] Fever   . Headache   . HIV infection ([place]) dx'd 10/2013  . Hypertension   . Leg pain    . Multiple environmental allergies    "trees, dogs, cats"  . Multiple food allergies   . Shingles < 2010    Past Surgical History: Past Surgical History:  Procedure Laterality Date  . AV FISTULA PLACEMENT Left 03/03/2015   Procedure: Creation of Left Arm RADIOCEPHALIC ARTERIOVENOUS FISTULA ;  Surgeon: Mal Misty, MD;  Location: [facility];  Service: Vascular;  Laterality: Left;  . COLONOSCOPY N/A 03/18/2016   Procedure: COLONOSCOPY;  Surgeon: Manus Gunning, MD;  Location: [facility];  Service: Gastroenterology;  Laterality: N/A;  . FEMUR FRACTURE SURGERY Right 09/2001  . FRACTURE SURGERY    . I&D EXTREMITY Right 12/18/2014   Procedure: IRRIGATION AND DEBRIDEMENT RIGHT LOWER EXTREMITY;  Surgeon: Ralene Ok, MD;  Location: [facility];  Service: General;  Laterality: Right;  . INSERTION OF DIALYSIS CATHETER  2016  . LAPAROTOMY N/A 12/28/2015   Procedure: EXPLORATORY LAPAROTOMY BOWEL RESECTION ILEOCECECTOMY;  Surgeon: Clovis Riley, MD;  Location: [facility];  Service: General;  Laterality: N/A;    Social History: Social History  Substance Use Topics  . Smoking status: Never Smoker  . Smokeless tobacco: Never  Used  . Alcohol use No     Comment: I stopped drinking along time ago "   Please also refer to relevant sections of EMR.  Family History: Family History  Problem Relation Age of Onset  . Hypertension Mother   . Diabetes Maternal Grandmother   . Kidney disease Maternal Grandmother     Allergies and Medications: Allergies  Allergen Reactions  . Bactrim [Sulfamethoxazole-Trimethoprim] Other (See Comments)    Renal failure  . Reglan [Metoclopramide] Other (See Comments)    Causes ticks/ jerks  . Vancomycin Other (See Comments)    Patient states vancomycin caused kidney injury  . Metoprolol Hives    Pt reports hives  . Amlodipine Palpitations and Other (See Comments)    fatigue  . Augmentin [Amoxicillin-Pot Clavulanate] Hives  . Procardia [Nifedipine] Hives     Pt reports hives, itching, SOB   No current facility-administered medications on file prior to encounter.    Current Outpatient Prescriptions on File Prior to Encounter  Medication Sig Dispense Refill  . acetaminophen (TYLENOL) 500 MG tablet Take 1,000 mg by mouth every 6 (six) hours as needed (pain).    [name] albuterol (PROVENTIL HFA;VENTOLIN HFA) 108 (90 Base) MCG/ACT inhaler Inhale 2 puffs into the lungs every 6 (six) hours as needed for wheezing or shortness of breath. 3 Inhaler 3  . Ascorbic Acid (VITAMIN C) 250 MG CHEW Chew 500 mg by mouth daily.     . clindamycin (CLEOCIN) 300 MG capsule Take 2 capsules (600 mg total) by mouth every 8 (eight) hours. 110 capsule 0  . dolutegravir (TIVICAY) 50 MG tablet Take 1 tablet (50 mg total) by mouth daily. 30 tablet 6  . emtricitabine-tenofovir AF (DESCOVY) 200-25 MG tablet Take 1 tablet by mouth daily. 30 tablet 6  . hydrALAZINE (APRESOLINE) 25 MG tablet Take 3 tablets (75 mg total) by mouth 3 (three) times daily. 90 tablet 0  . levETIRAcetam (KEPPRA) 250 MG tablet Take 1 tablet (250 mg) by mouth on Tuesday, Thursday, Saturday at 7pm (Patient taking differently: Take 250-500 mg by mouth See admin instructions. takes extra 250mg  on dialysis days Tu, Thurs, Sunday) 60 tablet 11  . levETIRAcetam (KEPPRA) 500 MG tablet Take 1 tablet (500 mg total) by mouth daily. 60 tablet 11  . losartan (COZAAR) 100 MG tablet Take 100 mg by mouth at bedtime.    . predniSONE (DELTASONE) 20 MG tablet Take 2 tab before bedtime on 4/6, then 2 tabs twice daily for 1 day, then 2 tabs daily for 5 days, then 1 tab daily for 11 days 25 tablet 0  . primaquine 26.3 MG tablet Take 2 tablets (30 mg total) by mouth daily. 36 tablet 0  . topiramate (TOPAMAX) 50 MG tablet Take 1 tablet (50 mg total) by mouth 2 (two) times daily. 30 tablet 0  . venlafaxine XR (EFFEXOR-XR) 150 MG 24 hr capsule TAKE 1 CAPSULE(150 MG) BY MOUTH DAILY (Patient not taking: Reported on 05/12/2016) 30 capsule 0     Objective: BP (!) 209/145   Pulse (!) 147   Temp 98.1 F (36.7 C) (Oral)   Resp (!) 25   SpO2 90%  Exam: GEN: NAD, in HD HEENT: Atraumatic, normocephalic, neck supple, EOMI, sclera clear, oropharynx normal Neck: + JVD CV: tachycardia, regular rhythm, S3 noted, no murmurs or rubs Respiratory: overall normal effort bit shallow breathing. Anteriorly no significant crackles or wheezing. Unable to auscultate posteriorly as in HD.  Gastrointestinal: Soft, diffusely tender but mainly in the periumbilical region  but no guarding or rebound, NABS, no organomegaly MSK: moves all extremities Derm: no rash  Neuro: awake, alert, no focal deficits grossly, normal speech Psych: Mood and affect euthymic, normal rate and volume of speech   Labs and Imaging: CBC BMET   Recent Labs Lab 06/17/16 1213  WBC 8.8  HGB 9.2*  HCT 28.6*  PLT 253    Recent Labs Lab 06/17/16 1213  NA 133*  K 5.1  CL 95*  CO2 21*  BUN 87*  CREATININE 11.34*  GLUCOSE 100*  CALCIUM 11.0*    istat trop 0.12 Lactic acid 1.70 Lipase 40 LFTs normal  CXR IMPRESSION: 1. New pulmonary vascular congestion with interstitial opacities bilaterally but more confluent in the lung bases. Differential considerations in interstitial pulmonary edema and an atypical or viral respiratory infection. 2. Stable massive cardiomegaly.  CTA Chest/CT abdomen and pelvis: IMPRESSION: No evidence for pulmonary embolus.  Extensive patchy ground-glass opacities throughout the lungs bilaterally which may represent edema or atypical infectious process. PCP pneumonia is not excluded.  Note somewhat difficult evaluation of the small bowel given lack of oral contrast material and intraperitoneal fat. No definite evidence for bowel obstruction.  Mild anasarca.  Moderate pericardial effusion.  Smiley Houseman, MD 06/17/2016, 4:31 PM PGY-2, [place] Intern pager: [phone], text pages  welcome

## 2016-06-17 NOTE — ED Notes (Signed)
Spoke with family over phone, gave update for plan of care

## 2016-06-17 NOTE — ED Notes (Signed)
Pt taken to CT.

## 2016-06-17 NOTE — ED Triage Notes (Signed)
Pt reports onset this am of severe abd pain near his incision site. Denies n/v/d. Dialysis pt, last treatment was Thursday. BP 217/145 at triage and appears very uncomfortable.

## 2016-06-17 NOTE — ED Notes (Signed)
Attempted to collect urine. Pt stated he does not produce urine.

## 2016-06-17 NOTE — Procedures (Signed)
  I was present at this dialysis session, have reviewed the session itself and made  appropriate changes Kelly Splinter MD [place] pager [phone]   06/17/2016, 4:01 PM

## 2016-06-17 NOTE — ED Provider Notes (Signed)
[name]   CSN: [id number] Arrival date & time: 06/17/16  1137     History   Chief Complaint Chief Complaint  Patient presents with  . Abdominal Pain    HPI Jim Wyatt is a 36 y.o. male . With a past medical history of HIV, end-stage renal disease, DVT, recent ileocecal intussusception in October 2017 who presents emergency Department with chief complaint of severe abdominal pain and severe shortness of breath. The patient is a high limit. He states that he flew back from [place] this morning. Chest pain. During the plane flight he started having worsening abdominal pain and shortness of breath. On his was on his way to dialysis and became too severe and he came to the emergency department. The patient denies fevers. He states that his pain feels similar to his previous intussusception. He is compliant with his HIV medications and did have a recent change in medicines. He denies any fevers, chills, nausea, vomiting. He is not on anticoagulants.  HPI  Past Medical History:  Diagnosis Date  . Asthma   . CAP (community acquired pneumonia) 10/2013  . Cellulitis 11/2013   LLE  . DVT (deep venous thrombosis) ([place]) 10/2013   BLE  . ESRD on hemodialysis ([place]) 01/2015   AKI with nephrotic syndrome (16gm proteinuria at time of biopsy, from collapsing FSG). 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  Went on HD Dec 2016.   [name] Fever   . Headache   . HIV infection ([place]) dx'd 10/2013  . Hypertension   . Leg pain   . Multiple environmental allergies    "trees, dogs, cats"  . Multiple food allergies   . Shingles < 2010    Patient Active Problem List   Diagnosis Date Noted  . ESRD on hemodialysis ([place])   . Acute pulmonary edema (HCC)   . SIRS (systemic inflammatory response syndrome) ([place]) 05/30/2016  . Acute respiratory failure ([place]) 05/30/2016  . 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 Noncompliance   . End stage renal disease ([place])   . Chest pressure   . Palliative care encounter   . Goals of care, counseling/discussion   . Shortness of breath 05/12/2016  . Dyspnea   . Hypertensive emergency   . Pericardial effusion   . Tachycardia   . HIV disease ([place])   . OSA (obstructive sleep apnea) 04/19/2016  . Hospital discharge follow-up 04/19/2016  . Depression 03/24/2016  . Breast lump or mass   . Rectal bleeding   . GI bleed 03/15/2016  . Hyperkalemia   . New onset seizure ([place]) 03/13/2016  . Symptomatic anemia 02/01/2016  . Dysuria 01/18/2016  . Hematochezia 01/09/2016  . Postoperative anemia due to acute blood loss 01/09/2016  . Seizure disorder ([place]) 12/29/2015  . Intussusception ([place]) 12/28/2015  . Hypertension 12/28/2015  . Otitis externa of both ears 11/21/2015  . AIDS (acquired immune deficiency syndrome) ([place])   . Meningitis   . Cephalalgia   . Blurry vision, bilateral   . Headache   . Hypertensive urgency   . New onset of headache in immunocompromised patient ([place]) 07/06/2015  . ESRD on dialysis ([place]) 04/27/2015  . CAP (community acquired pneumonia) 02/25/2015  . ESRD (end stage renal disease) ([place]) 02/16/2015  . History of DVT (deep vein thrombosis) 01/12/2015  . Candidal esophagitis ([place])   . Constipation   . Nausea without vomiting 12/20/2014  .  Abscess of right thigh 12/19/2014  . Chronic kidney disease (CKD), stage IV (severe) ([place]) 12/19/2014  . 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 Anemia due to end stage renal disease ([place]) 12/19/2014  . Hyponatremia 12/19/2014  . AIDS ([place])   . Community acquired pneumonia 07/30/2014  . Anemia of chronic disease 07/30/2014  . Acute on chronic renal failure ([place])   . 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 CMV (cytomegalovirus infection) ([place]) 12/07/2013  . Protein-calorie malnutrition, severe ([place]) 12/06/2013  . 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 DVT (deep venous thrombosis) ([place]) 11/24/2013  . Asthma, chronic 11/24/2013  . Acquired immune deficiency syndrome ([place]) 11/14/2013  . AKI (acute kidney injury) ([place]) 11/11/2013  . Elevated liver enzymes 11/09/2013  . History of shingles 11/09/2013  . Obesity 11/09/2013  . Abdominal pain 11/07/2013  . Asthma     Past Surgical History:  Procedure Laterality Date  . AV FISTULA PLACEMENT Left 03/03/2015   Procedure: Creation of Left Arm RADIOCEPHALIC ARTERIOVENOUS FISTULA ;  Surgeon: Mal Misty, MD;  Location: [facility];  Service: Vascular;  Laterality: Left;  . COLONOSCOPY N/A 03/18/2016   Procedure: COLONOSCOPY;  Surgeon: Manus Gunning, MD;  Location: [facility];  Service: Gastroenterology;  Laterality: N/A;  . FEMUR FRACTURE SURGERY Right 09/2001  . FRACTURE SURGERY    . I&D EXTREMITY Right 12/18/2014   Procedure: IRRIGATION AND DEBRIDEMENT RIGHT LOWER EXTREMITY;  Surgeon: Ralene Ok, MD;  Location: [facility];  Service: General;  Laterality: Right;  . INSERTION OF DIALYSIS CATHETER  2016  . LAPAROTOMY N/A 12/28/2015   Procedure: EXPLORATORY LAPAROTOMY BOWEL RESECTION ILEOCECECTOMY;  Surgeon: Clovis Riley, MD;  Location: [facility];  Service: General;  Laterality: N/A;       Home Medications    Prior to Admission medications   Medication Sig Start Date End Date Taking? Authorizing Provider  acetaminophen (TYLENOL) 500 MG tablet Take 1,000 mg by mouth every 6 (six) hours as needed (pain).    Historical Provider, MD  albuterol (PROVENTIL HFA;VENTOLIN HFA) 108 (90 Base) MCG/ACT inhaler Inhale 2 puffs into the lungs every 6 (six) hours as needed for wheezing or shortness of breath. 04/19/16   [name], MD  Ascorbic Acid (VITAMIN C) 250 MG CHEW Chew 500 mg by mouth daily.     Historical Provider, MD  clindamycin (CLEOCIN) 300 MG capsule Take 2 capsules (600 mg total) by mouth every 8 (eight) hours. 06/02/16 06/21/16  Ludlow Bing, DO    dolutegravir (TIVICAY) 50 MG tablet Take 1 tablet (50 mg total) by mouth daily. 06/02/16   Truman Hayward, MD  emtricitabine-tenofovir AF (DESCOVY) 200-25 MG tablet Take 1 tablet by mouth daily. 06/02/16   Truman Hayward, MD  hydrALAZINE (APRESOLINE) 25 MG tablet Take 3 tablets (75 mg total) by mouth 3 (three) times daily. 05/21/16   Eloise Levels, MD  levETIRAcetam (KEPPRA) 250 MG tablet Take 1 tablet (250 mg) by mouth on Tuesday, Thursday, Saturday at 7pm Patient taking differently: Take 250-500 mg by mouth See admin instructions. takes extra 250mg  on dialysis days Wallie Char, Sunday 03/13/16   Cameron Sprang, MD  levETIRAcetam (KEPPRA) 500 MG tablet Take 1 tablet (500 mg total) by mouth daily. 03/21/16   Burgess Estelle, MD  losartan (COZAAR) 100 MG tablet Take 100 mg by mouth at bedtime.    Historical Provider, MD  predniSONE (DELTASONE) 20 MG tablet Take 2 tab before bedtime on 4/6, then  2 tabs twice daily for 1 day, then 2 tabs daily for 5 days, then 1 tab daily for 11 days 06/02/16   [place] Bing, DO  primaquine 26.3 MG tablet Take 2 tablets (30 mg total) by mouth daily. 06/03/16 06/21/16  Texanna Bing, DO  topiramate (TOPAMAX) 50 MG tablet Take 1 tablet (50 mg total) by mouth 2 (two) times daily. 05/21/16   Eloise Levels, MD  venlafaxine XR (EFFEXOR-XR) 150 MG 24 hr capsule TAKE 1 CAPSULE(150 MG) BY MOUTH DAILY Patient not taking: Reported on 05/12/2016 04/19/16   Holley Raring, MD    Family History Family History  Problem Relation Age of Onset  . Hypertension Mother   . Diabetes Maternal Grandmother   . Kidney disease Maternal Grandmother     Social History Social History  Substance Use Topics  . Smoking status: Never Smoker  . Smokeless tobacco: Never Used  . Alcohol use No     Comment: I stopped drinking along time ago "     Allergies   Bactrim [sulfamethoxazole-trimethoprim]; Reglan [metoclopramide]; Vancomycin; Metoprolol; Amlodipine; Augmentin [amoxicillin-pot  clavulanate]; and Procardia [nifedipine]   Review of Systems Review of Systems  Ten systems reviewed and are negative for acute change, except as noted in the HPI.   Physical Exam Updated Vital Signs BP (!) 208/145   Pulse (!) 122   Temp 98.1 F (36.7 C) (Oral)   Resp 20   SpO2 92%   Physical Exam  Constitutional: He appears well-developed and well-nourished. No distress.  Patient appears very uncomfortable. He is is sitting on the edge of the bed stating: "I just can't breathe." He is also clutching his stomach  HENT:  Head: Normocephalic and atraumatic.  Eyes: Conjunctivae are normal. No scleral icterus.  Neck: Normal range of motion. Neck supple.  Cardiovascular: Normal rate, regular rhythm and normal heart sounds.   Pulmonary/Chest: Effort normal and breath sounds normal. No respiratory distress.  Shallow breathing Crackles  Abdominal: Soft. There is no tenderness.  Musculoskeletal: He exhibits no edema.  Neurological: He is alert.  Skin: Skin is warm and dry. He is not diaphoretic.  Psychiatric: His behavior is normal.  Nursing note and vitals reviewed.    ED Treatments / Results  Labs (all labs ordered are listed, but only abnormal results are displayed) Labs Reviewed  COMPREHENSIVE METABOLIC PANEL - Abnormal; Notable for the following:       Result Value   Sodium 133 (*)    Chloride 95 (*)    CO2 21 (*)    Glucose, Bld 100 (*)    BUN 87 (*)    Creatinine, Ser 11.34 (*)    Calcium 11.0 (*)    Albumin 3.4 (*)    GFR calc non Af Amer 5 (*)    GFR calc Af Amer 6 (*)    Anion gap 17 (*)    All other components within normal limits  CBC - Abnormal; Notable for the following:    RBC 3.04 (*)    Hemoglobin 9.2 (*)    HCT 28.6 (*)    RDW 19.2 (*)    All other components within normal limits  I-STAT TROPOININ, ED - Abnormal; Notable for the following:    Troponin i, poc 0.12 (*)    All other components within normal limits  LIPASE, BLOOD  URINALYSIS,  ROUTINE W REFLEX MICROSCOPIC  I-STAT CG4 LACTIC ACID, ED    EKG  EKG Interpretation None  Radiology Dg Chest Port 1 View  Result Date: 06/17/2016 CLINICAL DATA:  36 year old male with shortness of breath and hypoxia EXAM: PORTABLE CHEST 1 VIEW COMPARISON:  Prior chest x-ray 06/12/2016; prior CT scan of the chest 05/30/2016 FINDINGS: Stable marked cardiomegaly. Interval development of increased pulmonary vascular congestion now with mild diffuse interstitial prominence slightly more confluent in the lung bases. No pleural effusion or pneumothorax. IMPRESSION: 1. New pulmonary vascular congestion with interstitial opacities bilaterally but more confluent in the lung bases. Differential considerations in interstitial pulmonary edema and an atypical or viral respiratory infection. 2. Stable massive cardiomegaly. Electronically Signed   By: Jacqulynn Cadet M.D.   On: 06/17/2016 13:04    Procedures Procedures (including critical care time)  Medications Ordered in ED Medications  HYDROmorphone (DILAUDID) injection 1 mg (1 mg Intravenous Given 06/17/16 1311)  ondansetron (ZOFRAN) injection 4 mg (4 mg Intravenous Given 06/17/16 1311)  iopamidol (ISOVUE-370) 76 % injection (100 mLs  Contrast Given 06/17/16 1338)     Initial Impression / Assessment and Plan / ED Course  I have reviewed the triage vital signs and the nursing notes.  Pertinent labs & imaging results that were available during my care of the patient were reviewed by me and considered in my medical decision making (see chart for details).     patient with volume overload. I did discuss the case with dr. Jonnie Finner, who recommends admission. He needs  Final Clinical Impressions(s) / ED Diagnoses   Final diagnoses:  Anasarca    New Prescriptions New Prescriptions   No medications on file     Margarita Mail, PA-C 06/18/16 [place], MD 06/21/16 [phone]

## 2016-06-17 NOTE — Progress Notes (Addendum)
  [facility] Progress Note   Subjective: ESRD pt well known to our service, back in ED with SOB, was here 2 weeks ago with "PNA" which was really mostly vol overload.  Now SOB worse. +HA. BP's very high in ED , 210/ 145. We are asked to see for ESRD.  CXR +pulm edema, CT chest diffuse ground glass   Vitals:   06/17/16 1415 06/17/16 1421 06/17/16 1430 06/17/16 1500  BP:  (!) 206/143 (!) 190/141 (!) 209/145  Pulse: (!) 128 (!) 129 (!) 133 (!) 147  Resp: 19 20 20  (!) 25  Temp:      TempSrc:      SpO2: 92% 94% 92% 90%    Inpatient medications: . hydrALAZINE  10 mg Intravenous Once    hydrALAZINE  Exam: Somnolent, nasal O2, +jvd Chest bilat rales 1/2 up post, coughing up blood-tinged sputum RRR +S3 gallop, tachy, no sig M Abd soft no ascites Ext 1+ LE edema LFA AVF+bruit  Dialysis: NS TTS 3h 2min   67kg (just ^'d from 64.5kg)   450/800   Hep none  LFA AVF - Hect 5ug - Fe 100 x 10 - Mircera 225 last 3/10 - last Hb 9.7, 28% sat , ferr 500's, Ca 10.4, P 6, pth 250      Assessment: 1. SOB/ pulm edema  - due to vol overload which is a recurrent issue for this patient.  Severe HTN also related to chron vol overload.  +S3/ rales, etc.  Dry wt recently raised 64 > 67kg.   2. HTN'sive crisis/ urgency - long-term BP problems related to chronic vol overload and inability to restrict Na/ fluid intake. Pt in denial about his part of responsibility for these issues.   3. AIDS/ HIV - low counts, per ID  4. ESRD HD TTS, has been coming to OP HD sessions, usually leaves 15- 45 min early 5. Headaches - on topomax 6. Seizure d/o - on keppra 7. Hx asthma  Plan - HD today, max UF 5 L  Kelly Splinter MD [facility] pager [phone]   06/17/2016, 3:48 PM    Recent Labs Lab 06/12/16 0703 06/12/16 0716 06/17/16 1213  NA 133* 135 133*  K 5.6* 5.6* 5.1  CL 93* 98* 95*  CO2 23  --  21*  GLUCOSE 116* 121* 100*  BUN 70* 59* 87*  CREATININE 11.25* 12.70*  11.34*  CALCIUM 8.6*  --  11.0*    Recent Labs Lab 06/12/16 0703 06/17/16 1213  AST 39 33  ALT 18 17  ALKPHOS 46 44  BILITOT 0.8 1.1  PROT 7.5 8.0  ALBUMIN 3.2* 3.4*    Recent Labs Lab 06/12/16 0703 06/12/16 0716 06/17/16 1213  WBC 2.7*  --  8.8  NEUTROABS 2.1  --   --   HGB 8.5* 8.5* 9.2*  HCT 25.4* 25.0* 28.6*  MCV 94.1  --  94.1  PLT 134*  --  253   Iron/TIBC/Ferritin/ %Sat    Component Value Date/Time   IRON 59 01/12/2015 1617   TIBC 248 (L) 01/12/2015 1617   FERRITIN 1,050 (H) 02/01/2016 1836   IRONPCTSAT 24 01/12/2015 1617

## 2016-06-18 DIAGNOSIS — E8779 Other fluid overload: Secondary | ICD-10-CM

## 2016-06-18 LAB — CBC
HCT: 30.8 % — ABNORMAL LOW (ref 39.0–52.0)
Hemoglobin: 9.7 g/dL — ABNORMAL LOW (ref 13.0–17.0)
MCH: 30.4 pg (ref 26.0–34.0)
MCHC: 31.5 g/dL (ref 30.0–36.0)
MCV: 96.6 fL (ref 78.0–100.0)
Platelets: 300 10*3/uL (ref 150–400)
RBC: 3.19 MIL/uL — ABNORMAL LOW (ref 4.22–5.81)
RDW: 20.2 % — ABNORMAL HIGH (ref 11.5–15.5)
WBC: 9.7 10*3/uL (ref 4.0–10.5)

## 2016-06-18 LAB — RENAL FUNCTION PANEL
Albumin: 3.3 g/dL — ABNORMAL LOW (ref 3.5–5.0)
Anion gap: 13 (ref 5–15)
BUN: 39 mg/dL — ABNORMAL HIGH (ref 6–20)
CO2: 28 mmol/L (ref 22–32)
Calcium: 9.5 mg/dL (ref 8.9–10.3)
Chloride: 95 mmol/L — ABNORMAL LOW (ref 101–111)
Creatinine, Ser: 7.27 mg/dL — ABNORMAL HIGH (ref 0.61–1.24)
GFR calc Af Amer: 10 mL/min — ABNORMAL LOW (ref >60)
GFR calc non Af Amer: 9 mL/min — ABNORMAL LOW (ref >60)
Glucose, Bld: 107 mg/dL — ABNORMAL HIGH (ref 65–99)
Phosphorus: 4.5 mg/dL (ref 2.5–4.6)
Potassium: 4 mmol/L (ref 3.5–5.1)
Sodium: 136 mmol/L (ref 135–145)

## 2016-06-18 LAB — TROPONIN I
Troponin I: 0.19 ng/mL (ref <0.03)
Troponin I: 0.18 ng/mL (ref <0.03)
Troponin I: 0.16 ng/mL (ref <0.03)

## 2016-06-18 LAB — MRSA PCR SCREENING: MRSA by PCR: NEGATIVE

## 2016-06-18 MED ORDER — ONDANSETRON HCL 4 MG/2ML IJ SOLN
4.0000 mg | Freq: Four times a day (QID) | INTRAMUSCULAR | Status: DC | PRN
  Administered 2016-06-18: 4 mg via INTRAVENOUS
  Filled 2016-06-18: qty 2

## 2016-06-18 MED ORDER — PROCHLORPERAZINE EDISYLATE 5 MG/ML IJ SOLN
5.0000 mg | INTRAMUSCULAR | Status: DC | PRN
  Administered 2016-06-18: 5 mg via INTRAVENOUS
  Filled 2016-06-18: qty 2

## 2016-06-18 MED ORDER — HYDROMORPHONE HCL 1 MG/ML IJ SOLN
1.0000 mg | Freq: Once | INTRAMUSCULAR | Status: AC
  Administered 2016-06-18: 1 mg via INTRAVENOUS
  Filled 2016-06-18: qty 1

## 2016-06-18 MED ORDER — ACETAMINOPHEN 500 MG PO TABS
1000.0000 mg | ORAL_TABLET | Freq: Four times a day (QID) | ORAL | Status: DC | PRN
Start: 2016-06-18 — End: 2016-06-22
  Administered 2016-06-18 – 2016-06-22 (×8): 1000 mg via ORAL
  Filled 2016-06-18 (×9): qty 2

## 2016-06-18 MED ORDER — HYDRALAZINE HCL 50 MG PO TABS
100.0000 mg | ORAL_TABLET | Freq: Three times a day (TID) | ORAL | Status: DC
  Administered 2016-06-18 – 2016-06-22 (×12): 100 mg via ORAL
  Filled 2016-06-18 (×12): qty 2

## 2016-06-18 NOTE — Progress Notes (Signed)
CRITICAL VALUE ALERT  Critical value received:  Troponin 0.19  Date of notification:  06/18/16  Time of notification:  0052  Critical value read back: YES  Nurse who received alert:  Johnnye Sima, RN, BSN  MD notified (1st page):  FMTS  Time of first page:  0054  MD notified (2nd page):  Time of second page:  Responding MD:    Time MD responded:

## 2016-06-18 NOTE — Progress Notes (Signed)
Family Medicine Teaching Service Daily Progress Note Intern Pager: [phone]  Patient name: Jim Wyatt Medical record number: [id number] Date of birth: [birth year] Age: 36 y.o. Gender: male  Primary Care Provider: [name], MD Consultants: Nephrology Code Status: Full   Pt Overview and Major Events to Date:  4/21: Admit for dyspnea. HD w/ 3.8 L out  Assessment and Plan: Jim Wyatt a 36 y.o.malepresenting with dyspnea. PMH is significant for poorly controlled hypertension, headache, HIV with AIDS, end-stage renal disease on dialysis (T/TH/S) and history of DVT x 1 in 2015 (not on any anti-coagulation), recent diagnosis of PCP PNA, hx of ileocecal intussusception 11/2015.  Dyspnea: Suspect this is likely from fluid overload in the setting of ESRD vs combination of insufficient treatment for PCP pneumonia (see below A/P). PE and MI have have been ruled out. Continues to have tachypnea with RR in the teens to low 20's and on 4 L [place] (No supplemental O2 requirement at home). CTA chest showed no PE, but does show moderate pericardial effusion without evidence of tamponade.  - Continue to monitor in SDU - Vitals per floor protocol - Supplemental O2 if needed, goal O2 >90% - Wean O2 as tolerated - Nephrology consulted, appreciate their assistance - will continue treatment for PCP PNA as noted below  PCP PNA: diagnosed at hospitalization early this month. Per outpatient pharamacy, patient filled medication on 4/15. - continue Prednisone taper: currently Prednisone 20mg  daily for 11 days starting 4/22 - continue Clindamycin 300mg  q 8hr and Primaquine 30mg  daily (day 7/18 today)  Poorly Controlled HTN I renovascular HTN and tachycardia: Current home medications include Hydralazine 75 TID, Losartan 100mg  daily. Continues to have tachycardia to the 120's and hypertension to 140-170's/90-120's overnight and this morning. Vital are better than admission though. EKG showing  sinus tachycardia - Nephrology consulted above - Continue home medications Hydralazine 75 TID and Losartan 100mg  daily -  Hydralazine 10-20mg  q 2 hr PRN ordered by nephrology (has needed 40 mg total of PRN overnight and this morning) - telemetry  Pericardial Effusion: in the setting of volume overload. No sign of cardiac tamponade - close monitoring - likely improve with HD - Can consider getting an echo either today or tomorrow to ensure resolution  Abdominal pain and diarrhea: Improved this morning. Noted to have this in the past when fluid overloaded. Is TTP around the periumbilical region the most but somewhat diffuse tender. Reports symptoms were similar to prior intussusception episode. LFTs and lipase unremarkable.  CT abdomen and pelvis: There are a few prominent loops of small bowel within the central abdomen measuring up to 3.5 cm. Remainder of the small bowel isdifficult to fully assess given lack of intraperitoneal fat. No definite abnormal bowel wall thickening. No free fluid or free intraperitoneal air. Abdominal pain possibly from fluid overload; considered peritoneal infection but no free fluid noted. Chronic diarrhea unclear in etiology - fecal lactoferin and GI pathogen panel and FOBT - Dilaudid 0.5mg  Q 6 PRN   End-stage renal disease: Receives dialysis on a T/TH/S. Patient reports he had extra HD this past week (Monday to Thursday).  - nephrology consulted;appreciate recommendations - AM renal function panels  Anemia of Chronic Disease: hgb stable today - monitor with labs  HIV disease/advanced AIDS: Last CD4 count 05/30/16 to 20 and HIV RNA Quant's, log 5.243 - continue home meds: Descovy and Tivicay  Seizure disorder: Taking keppra every day.  - continue keppra 500mg  daily and an additional 250mg  on dialysis days (T/TH/S)  Possible OSA: Supposed to have sleep study. Does notuse CPAP.  - f/u sleep study as an outpatient  Asthma: Takes albuterol when  necessary at home. Denies tobacco.  - outpatient PFTs  Medical noncompliance: Patient refusing to take recommended antibiotics, antihypertensives, and BiPAP. Explained to patient the risks of refusing, he expresses understanding.  Hx of Headache: reports of mild HA currently without visual changes. Was recently admitted last month for headache with visual changes.  - home Topamax 50 BID - monitor - Tylenol PRN for now.   History of DVT: Bilateral 2015 9/15. Due to immobility. Completed course of anticoagulation. No LE tenderness or edema. CTA chest negative for PE  FEN/GI: renal diet with fluid restriction Prophylaxis: subcutaneous heparin  Disposition: Likely home pending improvement in diuresis and dyspnea  Subjective:  No acute events overnight. Patient complaining of headache. States that his headache is chronic (he gets it after dialysis). No change in vision, weakness, numbness. Besides headache denies any pain. Eating breakfast.   Objective: Temp:  [98.1 F (36.7 C)-98.5 F (36.9 C)] 98.4 F (36.9 C) (04/22 0354) Pulse Rate:  [112-147] 126 (04/22 0354) Resp:  [16-27] 20 (04/22 0354) BP: (145-217)/(84-155) 178/128 (04/22 0653) SpO2:  [90 %-100 %] 100 % (04/22 0354) Weight:  [156 lb 8.4 oz (71 kg)] 156 lb 8.4 oz (71 kg) (04/21 1919) Physical Exam: General: Sitting up on the side of the bed eating breakfast Cardiovascular: Tachycardic rate, regular rhythm. No murmurs appreciated Respiratory: Normal work of breathing. Good air movement in all lung fields bilaterally, possible crackles at the bases Abdomen: slightly distended Extremities: no edema  Laboratory:  Recent Labs Lab 06/12/16 0703 06/12/16 0716 06/17/16 1213 06/18/16 0335  WBC 2.7*  --  8.8 9.7  HGB 8.5* 8.5* 9.2* 9.7*  HCT 25.4* 25.0* 28.6* 30.8*  PLT 134*  --  253 300    Recent Labs Lab 06/12/16 0703 06/12/16 0716 06/17/16 1213 06/18/16 0335  NA 133* 135 133* 136  K 5.6* 5.6* 5.1 4.0  CL  93* 98* 95* 95*  CO2 23  --  21* 28  BUN 70* 59* 87* 39*  CREATININE 11.25* 12.70* 11.34* 7.27*  CALCIUM 8.6*  --  11.0* 9.5  PROT 7.5  --  8.0  --   BILITOT 0.8  --  1.1  --   ALKPHOS 46  --  44  --   ALT 18  --  17  --   AST 39  --  33  --   GLUCOSE 116* 121* 100* 107*    Troponin (Point of Care Test)  Recent Labs  06/17/16 1239  TROPIPOC 0.12*    Imaging/Diagnostic Tests: Ct Angio Chest Pe W And/or Wo Contrast  Result Date: 06/17/2016 CLINICAL DATA:  Evaluate for pulmonary embolus. Recent flight. Severe abdominal pain. EXAM: CT ANGIOGRAPHY CHEST CT ABDOMEN AND PELVIS WITH CONTRAST TECHNIQUE: Multidetector CT imaging of the chest was performed using the standard protocol during bolus administration of intravenous contrast. Multiplanar CT image reconstructions and MIPs were obtained to evaluate the vascular anatomy. Multidetector CT imaging of the abdomen and pelvis was performed using the standard protocol during bolus administration of intravenous contrast. CONTRAST:  100 cc Isovue 370 COMPARISON:  PE CT 05/30/2016; abdomen pelvis CT 05/16/2016 FINDINGS: CTA CHEST FINDINGS Cardiovascular: Cardiomegaly. Moderate pericardial effusion. Aorta and main pulmonary artery are normal in caliber. Limited exam due to contrast bolus. Within the above limitation, no evidence for pulmonary embolus. Mediastinum/Nodes: Stable prominent right hilar lymph node measuring 1.5  cm. No additional mediastinal or hilar adenopathy. The esophagus is normal in appearance. Lungs/Pleura: Central airways are patent. Re- demonstrated extensive bilateral ground-glass pulmonary opacities. Small bilateral pleural effusions. No pneumothorax. Musculoskeletal: Grossly unchanged lucent and sclerotic lesion within the posterior right third rib (image 17; series 7), nonspecific in etiology. No acute process. Review of the MIP images confirms the above findings. CT ABDOMEN and PELVIS FINDINGS Hepatobiliary: Liver is normal in size  and contour. Gallbladder is unremarkable. Pancreas: Unremarkable Spleen: Unremarkable Adrenals/Urinary Tract: Adrenal glands are normal. Kidneys are atrophic bilaterally. Urinary bladder is decompressed. Stomach/Bowel: Postsurgical changes within the right hemiabdomen. There are a few prominent loops of small bowel within the central abdomen measuring up to 3.5 cm. Remainder of the small bowel is difficult to fully assess given lack of intraperitoneal fat. No definite abnormal bowel wall thickening. No free fluid or free intraperitoneal air. Vascular/Lymphatic: Normal caliber abdominal aorta. No retroperitoneal lymphadenopathy. Reproductive: Prostate is unremarkable. Other: Diffuse anasarca. Musculoskeletal: No aggressive or acute appearing osseous lesions. Review of the MIP images confirms the above findings. IMPRESSION: No evidence for pulmonary embolus. Extensive patchy ground-glass opacities throughout the lungs bilaterally which may represent edema or atypical infectious process. PCP pneumonia is not excluded. Note somewhat difficult evaluation of the small bowel given lack of oral contrast material and intraperitoneal fat. No definite evidence for bowel obstruction. Mild anasarca. Moderate pericardial effusion. Electronically Signed   By: Lovey Newcomer M.D.   On: 06/17/2016 14:14   Ct Abdomen Pelvis W Contrast  Result Date: 06/17/2016 CLINICAL DATA:  Evaluate for pulmonary embolus. Recent flight. Severe abdominal pain. EXAM: CT ANGIOGRAPHY CHEST CT ABDOMEN AND PELVIS WITH CONTRAST TECHNIQUE: Multidetector CT imaging of the chest was performed using the standard protocol during bolus administration of intravenous contrast. Multiplanar CT image reconstructions and MIPs were obtained to evaluate the vascular anatomy. Multidetector CT imaging of the abdomen and pelvis was performed using the standard protocol during bolus administration of intravenous contrast. CONTRAST:  100 cc Isovue 370 COMPARISON:  PE CT  05/30/2016; abdomen pelvis CT 05/16/2016 FINDINGS: CTA CHEST FINDINGS Cardiovascular: Cardiomegaly. Moderate pericardial effusion. Aorta and main pulmonary artery are normal in caliber. Limited exam due to contrast bolus. Within the above limitation, no evidence for pulmonary embolus. Mediastinum/Nodes: Stable prominent right hilar lymph node measuring 1.5 cm. No additional mediastinal or hilar adenopathy. The esophagus is normal in appearance. Lungs/Pleura: Central airways are patent. Re- demonstrated extensive bilateral ground-glass pulmonary opacities. Small bilateral pleural effusions. No pneumothorax. Musculoskeletal: Grossly unchanged lucent and sclerotic lesion within the posterior right third rib (image 17; series 7), nonspecific in etiology. No acute process. Review of the MIP images confirms the above findings. CT ABDOMEN and PELVIS FINDINGS Hepatobiliary: Liver is normal in size and contour. Gallbladder is unremarkable. Pancreas: Unremarkable Spleen: Unremarkable Adrenals/Urinary Tract: Adrenal glands are normal. Kidneys are atrophic bilaterally. Urinary bladder is decompressed. Stomach/Bowel: Postsurgical changes within the right hemiabdomen. There are a few prominent loops of small bowel within the central abdomen measuring up to 3.5 cm. Remainder of the small bowel is difficult to fully assess given lack of intraperitoneal fat. No definite abnormal bowel wall thickening. No free fluid or free intraperitoneal air. Vascular/Lymphatic: Normal caliber abdominal aorta. No retroperitoneal lymphadenopathy. Reproductive: Prostate is unremarkable. Other: Diffuse anasarca. Musculoskeletal: No aggressive or acute appearing osseous lesions. Review of the MIP images confirms the above findings. IMPRESSION: No evidence for pulmonary embolus. Extensive patchy ground-glass opacities throughout the lungs bilaterally which may represent edema or atypical infectious process. PCP  pneumonia is not excluded. Note somewhat  difficult evaluation of the small bowel given lack of oral contrast material and intraperitoneal fat. No definite evidence for bowel obstruction. Mild anasarca. Moderate pericardial effusion. Electronically Signed   By: Lovey Newcomer M.D.   On: 06/17/2016 14:14   Dg Chest Port 1 View  Result Date: 06/17/2016 CLINICAL DATA:  36 year old male with shortness of breath and hypoxia EXAM: PORTABLE CHEST 1 VIEW COMPARISON:  Prior chest x-ray 06/12/2016; prior CT scan of the chest 05/30/2016 FINDINGS: Stable marked cardiomegaly. Interval development of increased pulmonary vascular congestion now with mild diffuse interstitial prominence slightly more confluent in the lung bases. No pleural effusion or pneumothorax. IMPRESSION: 1. New pulmonary vascular congestion with interstitial opacities bilaterally but more confluent in the lung bases. Differential considerations in interstitial pulmonary edema and an atypical or viral respiratory infection. 2. Stable massive cardiomegaly. Electronically Signed   By: Jacqulynn Cadet M.D.   On: 06/17/2016 13:04     Carlyle Dolly, MD 06/18/2016, 9:01 AM PGY-2, [place] Intern pager: [phone], text pages welcome\

## 2016-06-18 NOTE — Progress Notes (Addendum)
[facility] Progress Note   Subjective: looks much better, breathing easy and scalp/ head veins not bulging out like yest   Vitals:   06/18/16 0354 06/18/16 0653 06/18/16 0800 06/18/16 1200  BP: (!) 145/94 (!) 178/128 (!) 165/117 (!) 165/115  Pulse: (!) 126  (!) 120   Resp: 20  17 16   Temp: 98.4 F (36.9 C)  98.4 F (36.9 C) 98.8 F (37.1 C)  TempSrc: Oral  Oral Oral  SpO2: 100%  100%   Weight:        Inpatient medications: . clindamycin  600 mg Oral Q8H  . dolutegravir  50 mg Oral Daily  . emtricitabine-tenofovir AF  1 tablet Oral Daily  . heparin  5,000 Units Subcutaneous Q8H  . hydrALAZINE  100 mg Oral TID  . levETIRAcetam  250 mg Oral Q T,Th,Sat-1800  . levETIRAcetam  500 mg Oral Daily  . losartan  100 mg Oral Daily  . predniSONE  20 mg Oral Q breakfast  . primaquine  30 mg Oral Daily  . topiramate  50 mg Oral BID    acetaminophen, hydrALAZINE, HYDROmorphone (DILAUDID) injection, ondansetron (ZOFRAN) IV, prochlorperazine  Exam: Sleeping , awakens and fully Ox 3 Chest clear w/o rales or wheezing RRR soft SEM, no gallop Abd soft no ascites Ext trace LE edema LFA AVF+bruit  Dialysis: NS TTS 3h 47min   67kg (just ^'d from 64.5kg)   450/800   Hep none  LFA AVF - Hect 5ug - Fe 100 x 10 - Mircera 225 last 3/10 - last Hb 9.7, 28% sat , ferr 500's, Ca 10.4, P 6, pth 250      Assessment: 1. SOB/ pulm edema/ volume overload  - I haven't seen anything in the last 3-4 hosp admissions for this patient that was not directly attributable to the simple complications of severe volume overload in a dialysis patient.  He is not willing to admit that this is the problem, but it is.   2. HTN'sive crisis - BP's coming down w/ vol removal. On hydralazine/ losartan as at home.  3. AIDS/ HIV - low counts, per ID  4. ESRD HD TTS, has been coming to OP HD sessions, usually leaves 15- 45 min early 5. Headaches - on topomax 6. Seizure d/o - on keppra 7. Hx  asthma  Plan - extra HD tomorrow, he is still up 4-7kg depending on what we use as his dry wt (64.5 or 67).  He agrees to extra HD.  Changed am labs to be drawn on HD.   Kelly Splinter MD [facility] pager [phone]   06/18/2016, 3:33 PM    Recent Labs Lab 06/12/16 0703 06/12/16 0716 06/17/16 1213 06/18/16 0335  NA 133* 135 133* 136  K 5.6* 5.6* 5.1 4.0  CL 93* 98* 95* 95*  CO2 23  --  21* 28  GLUCOSE 116* 121* 100* 107*  BUN 70* 59* 87* 39*  CREATININE 11.25* 12.70* 11.34* 7.27*  CALCIUM 8.6*  --  11.0* 9.5  PHOS  --   --   --  4.5    Recent Labs Lab 06/12/16 0703 06/17/16 1213 06/18/16 0335  AST 39 33  --   ALT 18 17  --   ALKPHOS 46 44  --   BILITOT 0.8 1.1  --   PROT 7.5 8.0  --   ALBUMIN 3.2* 3.4* 3.3*    Recent Labs Lab 06/12/16 0703 06/12/16 0716 06/17/16 1213 06/18/16 0335  WBC 2.7*  --  8.8 9.7  NEUTROABS 2.1  --   --   --   HGB 8.5* 8.5* 9.2* 9.7*  HCT 25.4* 25.0* 28.6* 30.8*  MCV 94.1  --  94.1 96.6  PLT 134*  --  253 300   Iron/TIBC/Ferritin/ %Sat    Component Value Date/Time   IRON 59 01/12/2015 1617   TIBC 248 (L) 01/12/2015 1617   FERRITIN 1,050 (H) 02/01/2016 1836   IRONPCTSAT 24 01/12/2015 1617

## 2016-06-19 LAB — CBC
HCT: 27.4 % — ABNORMAL LOW (ref 39.0–52.0)
Hemoglobin: 8.7 g/dL — ABNORMAL LOW (ref 13.0–17.0)
MCH: 30.4 pg (ref 26.0–34.0)
MCHC: 31.8 g/dL (ref 30.0–36.0)
MCV: 95.8 fL (ref 78.0–100.0)
Platelets: 228 10*3/uL (ref 150–400)
RBC: 2.86 MIL/uL — ABNORMAL LOW (ref 4.22–5.81)
RDW: 20.4 % — ABNORMAL HIGH (ref 11.5–15.5)
WBC: 7.5 10*3/uL (ref 4.0–10.5)

## 2016-06-19 LAB — RENAL FUNCTION PANEL
Albumin: 2.8 g/dL — ABNORMAL LOW (ref 3.5–5.0)
Anion gap: 13 (ref 5–15)
BUN: 64 mg/dL — ABNORMAL HIGH (ref 6–20)
CO2: 27 mmol/L (ref 22–32)
Calcium: 9 mg/dL (ref 8.9–10.3)
Chloride: 93 mmol/L — ABNORMAL LOW (ref 101–111)
Creatinine, Ser: 11.13 mg/dL — ABNORMAL HIGH (ref 0.61–1.24)
GFR calc Af Amer: 6 mL/min — ABNORMAL LOW (ref >60)
GFR calc non Af Amer: 5 mL/min — ABNORMAL LOW (ref >60)
Glucose, Bld: 113 mg/dL — ABNORMAL HIGH (ref 65–99)
Phosphorus: 5.9 mg/dL — ABNORMAL HIGH (ref 2.5–4.6)
Potassium: 4 mmol/L (ref 3.5–5.1)
Sodium: 133 mmol/L — ABNORMAL LOW (ref 135–145)

## 2016-06-19 MED ORDER — PENTAFLUOROPROP-TETRAFLUOROETH EX AERO: 1.0000 "application " | INHALATION_SPRAY | CUTANEOUS | Status: DC | PRN

## 2016-06-19 MED ORDER — LIDOCAINE HCL (PF) 1 % IJ SOLN: 5.0000 mL | INTRAMUSCULAR | Status: DC | PRN

## 2016-06-19 MED ORDER — HEPARIN SODIUM (PORCINE) 1000 UNIT/ML DIALYSIS: 40.0000 [IU]/kg | INTRAMUSCULAR | Status: DC | PRN

## 2016-06-19 MED ORDER — SODIUM CHLORIDE 0.9 % IV SOLN: 100.0000 mL | INTRAVENOUS | Status: DC | PRN

## 2016-06-19 MED ORDER — HEPARIN SODIUM (PORCINE) 1000 UNIT/ML DIALYSIS: 1000.0000 [IU] | INTRAMUSCULAR | Status: DC | PRN

## 2016-06-19 MED ORDER — LIDOCAINE-PRILOCAINE 2.5-2.5 % EX CREA: 1.0000 "application " | TOPICAL_CREAM | CUTANEOUS | Status: DC | PRN

## 2016-06-19 MED ORDER — HYDROMORPHONE HCL 1 MG/ML IJ SOLN
INTRAMUSCULAR | Status: AC
  Filled 2016-06-19: qty 1

## 2016-06-19 MED ORDER — ALTEPLASE 2 MG IJ SOLR: 2.0000 mg | Freq: Once | INTRAMUSCULAR | Status: DC | PRN

## 2016-06-19 MED ORDER — ALTEPLASE 2 MG IJ SOLR
2.0000 mg | Freq: Once | INTRAMUSCULAR | Status: DC | PRN
Start: 2016-06-19 — End: 2016-06-19

## 2016-06-19 NOTE — Progress Notes (Signed)
Patient ID: Jim Wyatt, male   DOB: 12-Jan-1981, 36 y.o.   MRN: [id number]  [facility] Progress Note   Assessment/ Plan:   1. Volume overload/pulmonary edema: Unfortunately, with pattern of poor compliance with restriction of intradialytic weight control. Discussed compliance yet again (much to his annoyance) 2. ESRD: Usually on a Tuesday/Thursday/Saturday hemodialysis schedule with unfortunate early sign of/truncation of treatment. Extra hemodialysis to be undertaken today to try and challenge his dry weight. 3. Anemia: Hemoglobin levels borderline low, restart ESA now that blood pressures are better controlled. 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 5. HIV/AIDS: Continue antiretroviral therapy, will further follow-up and management per ID. 6. Hypertension: We are presented with hypertensive crisis associated likely with his volume overload and unclear if fully compliant with outpatient oral antibiotic therapy.  Subjective:   Reports some abdominal pain over incision site-denies any chest pain and feels the shortness of breath is better.    Objective:   BP (!) 161/112 (BP Location: Right Arm)   Pulse (!) 118   Temp 97.9 F (36.6 C) (Oral)   Resp 17   Ht 5\' 7"  (1.702 m)   Wt 71.8 kg (158 lb 4.6 oz)   SpO2 100%   BMI 24.79 kg/m   Physical Exam: [id number]:[id number] to be somewhat uncomfortable resting in bed-facial swelling noted CVS: Pulse regular tachycardia, normal rhythm Resp: Clear to auscultation bilaterally, no distinct rales or rhonchi Abd: Soft, tenderness over right lower quadrant, guarding noted Ext: Trace lower extremity edema, left radiocephalic fistula with thrill and bruit  Labs: BMET  Recent Labs Lab 06/17/16 1213 06/18/16 0335  NA 133* 136  K 5.1 4.0  CL 95* 95*  CO2 21* 28  GLUCOSE 100* 107*  BUN 87* 39*  CREATININE 11.34* 7.27*  CALCIUM 11.0* 9.5  PHOS  --  4.5   CBC  Recent Labs Lab 06/17/16 1213  06/18/16 0335  WBC 8.8 9.7  HGB 9.2* 9.7*  HCT 28.6* 30.8*  MCV 94.1 96.6  PLT 253 300   Medications:    . clindamycin  600 mg Oral Q8H  . dolutegravir  50 mg Oral Daily  . emtricitabine-tenofovir AF  1 tablet Oral Daily  . heparin  5,000 Units Subcutaneous Q8H  . hydrALAZINE  100 mg Oral TID  . levETIRAcetam  250 mg Oral Q T,Th,Sat-1800  . levETIRAcetam  500 mg Oral Daily  . losartan  100 mg Oral Daily  . predniSONE  20 mg Oral Q breakfast  . primaquine  30 mg Oral Daily  . topiramate  50 mg Oral BID   Elmarie Shiley, MD 06/19/2016, 9:01 AM

## 2016-06-19 NOTE — Progress Notes (Signed)
Family Medicine Teaching Service Daily Progress Note Intern Pager: [phone]  Patient name: Jim Wyatt Medical record number: [id number] Date of birth: 1980-10-15 Age: 36 y.o. Gender: male  Primary Care Provider: [name], MD Consultants: Nephrology Code Status: Full   Pt Overview and Major Events to Date:  4/21: Admit for dyspnea. HD w/ 3.8 L out  Assessment and Plan: Jim Wyatt a 36 y.o.malepresenting with dyspnea. PMH is significant for poorly controlled hypertension, headache, HIV with AIDS, end-stage renal disease on dialysis (T/TH/S) and history of DVT x 1 in 2015 (not on any anti-coagulation), recent diagnosis of PCP PNA, hx of ileocecal intussusception 11/2015.  Dyspnea 2/2 severe fluid overload in the setting of ESRD, improving:  Weight 158# today, dry weight of 143#. Intermittently tachypneic low 20's but now off O2 and on room air. CTA chest showed no PE, but does show moderate pericardial effusion without evidence of tamponade.  - Continue to monitor in SDU - Vitals per floor protocol - Supplemental O2 if needed, goal O2 >90% - Nephrology following, extra HD today. Appreciate recommendations.  PCP PNA: diagnosed at hospitalization early this month. Per outpatient pharamacy, patient filled medication on 4/15. - continue Prednisone taper: currently Prednisone 20mg  daily for 11 days starting 4/22, last dose 5/3 - continue Clindamycin 300mg  q 8hr and Primaquine 30mg  daily (day 8/18 today)  Poorly Controlled HTN I renovascular HTN and tachycardia: Continues to have HR 120's and BP 140-160's/110-120's, improved from admission. Current home medications include Hydralazine 75 TID, Losartan 100mg  daily.  - Nephrology consulted above - Continue home medications Hydralazine 75 TID and Losartan 100mg  daily -  Hydralazine 10-20mg  q 2 hr PRN ordered by nephrology (has needed 20 mg total in the last 24hrs) - Monitor on telemetry  Pericardial Effusion: in the  setting of volume overload. No sign of cardiac tamponade - close monitoring - will likely improve with HD - Can consider getting an echo when euvolemic to ensure resolution  Abdominal pain, improving: Noted to have this in the past when fluid overloaded. TTP around the periumbilical scar. Chronic diarrhea unclear in etiology but no diarrhea this am. - fecal lactoferin and GI pathogen panel and FOBT - tylenol 1000mg  q6prn - discontinue dilaudid 0.5mg  Q 6 PRN for breakthrough  End-stage renal disease: Receives dialysis on a T/TH/S.  - Nephrology following, extra HD today. Appreciate recommendations - daily renal function panel  Anemia of Chronic Disease: hgb stable, baseline ~9.5 - monitor CBC  HIV disease/advanced AIDS: Last CD4 count 05/30/16 to 20 and HIV RNA Quant's, log 5.243 - continue home meds: Descovy and Tivicay  Seizure disorder: Taking keppra every day.  - continue keppra 500mg  daily and an additional 250mg  on dialysis days (T/TH/S)  Possible OSA: Supposed to have sleep study. Does notuse CPAP.  - f/u sleep study as an outpatient  Asthma: Takes albuterol when necessary at home. Denies tobacco.  - outpatient PFTs  Medical noncompliance: Patient refusing to take recommended antibiotics, antihypertensives, and BiPAP. Explained to patient the risks of refusing, he expresses understanding.  Hx of Headache: reports of mild HA currently without visual changes. Was recently admitted last month for headache with visual changes.  - home Topamax 50 BID - monitor - Tylenol PRN for now.   History of DVT: Bilateral 2015 9/15. Due to immobility. Completed course of anticoagulation. No LE tenderness or edema. CTA chest negative for PE  FEN/GI: renal diet with fluid restriction Prophylaxis: subcutaneous heparin  Disposition: Likely home pending improvement in diuresis  and dyspnea  Subjective:  No acute events overnight. Patient states both HA and abdominal pain  improved from yesterday. No nausea/vomiting. No diarrhea this morning but just woke up.  No change in vision, weakness, numbness. Abdominal pain is just around his old incision site. States breathing is much improved but not quite back to baseline.   Objective: Temp:  [98.4 F (36.9 C)-98.9 F (37.2 C)] 98.6 F (37 C) (04/23 0431) Pulse Rate:  [114-124] 118 (04/23 0431) Resp:  [15-22] 17 (04/23 0431) BP: (149-168)/(103-131) 161/112 (04/23 0431) SpO2:  [97 %-100 %] 100 % (04/23 0431) Weight:  [158 lb 4.6 oz (71.8 kg)-163 lb 9.3 oz (74.2 kg)] 158 lb 4.6 oz (71.8 kg) (04/23 3267) Physical Exam: General: Laying in bed comfortably, in NAD Cardiovascular: Tachycardic rate, regular rhythm. Systolic murmur appreciated.  Respiratory: Normal work of breathing on room air. Good air movement in all lung fields bilaterally. No crackles or wheezes Abdomen: mildly TTP over incision in periumbilical area with some mild distension and guarding but soft. + bowel sounds. No rebound. Extremities: no edema  Laboratory:  Recent Labs Lab 06/12/16 0716 06/17/16 1213 06/18/16 0335  WBC  --  8.8 9.7  HGB 8.5* 9.2* 9.7*  HCT 25.0* 28.6* 30.8*  PLT  --  253 300    Recent Labs Lab 06/12/16 0716 06/17/16 1213 06/18/16 0335  NA 135 133* 136  K 5.6* 5.1 4.0  CL 98* 95* 95*  CO2  --  21* 28  BUN 59* 87* 39*  CREATININE 12.70* 11.34* 7.27*  CALCIUM  --  11.0* 9.5  PROT  --  8.0  --   BILITOT  --  1.1  --   ALKPHOS  --  44  --   ALT  --  17  --   AST  --  33  --   GLUCOSE 121* 100* 107*    Imaging/Diagnostic Tests: No results found.   Bufford Lope, DO 06/19/2016, 7:09 AM PGY-1, [place] Intern pager: [phone], text pages welcome\

## 2016-06-19 NOTE — Care Management Important Message (Signed)
Important Message  Patient Details  Name: LORIE MELICHAR MRN: [id number] Date of Birth: 1980/02/28   Medicare Important Message Given:  Yes    Zenon Mayo, RN 06/19/2016, 12:22 [place] Message  Patient Details  Name: LUKA STOHR MRN: [id number] Date of Birth: 03-01-1980   Medicare Important Message Given:  Yes    Zenon Mayo, RN 06/19/2016, 12:21 PM

## 2016-06-19 NOTE — Care Management Note (Signed)
Case Management Note  Patient Details  Name: Jim Wyatt MRN: [id number] Date of Birth: 1980-08-13  Subjective/Objective:    From home with a room mate,pta indep,  Hx ESRD, presents with acute respiratory failure,  pcp pna, pericardial effusion, abd pain, anemia of chronic dz, 042, seizure d/o.  He has medication coverage and transport at dc.  He has a PCP, Dr. Doreene Burke at [facility] clinic.                  Action/Plan: NCM will follow for dc needs.  Expected Discharge Date:  06/19/16               Expected Discharge Plan:  Home/Self Care  In-House Referral:     Discharge planning Services  CM Consult  Post Acute Care Choice:    Choice offered to:     DME Arranged:    DME Agency:     HH Arranged:    HH Agency:     Status of Service:  In process, will continue to follow  If discussed at Long Length of Stay Meetings, dates discussed:    Additional Comments:  Zenon Mayo, RN 06/19/2016, 12:22 PM

## 2016-06-19 NOTE — Procedures (Signed)
Patient seen on Hemodialysis. QB 400, UF goal 4L Treatment adjusted as needed.  Elmarie Shiley MD [facility]. Office # [phone] Pager # [phone] 10:54 AM

## 2016-06-20 LAB — RENAL FUNCTION PANEL
Albumin: 2.9 g/dL — ABNORMAL LOW (ref 3.5–5.0)
Anion gap: 11 (ref 5–15)
BUN: 49 mg/dL — ABNORMAL HIGH (ref 6–20)
CO2: 27 mmol/L (ref 22–32)
Calcium: 9.1 mg/dL (ref 8.9–10.3)
Chloride: 97 mmol/L — ABNORMAL LOW (ref 101–111)
Creatinine, Ser: 8.46 mg/dL — ABNORMAL HIGH (ref 0.61–1.24)
GFR calc Af Amer: 9 mL/min — ABNORMAL LOW (ref >60)
GFR calc non Af Amer: 7 mL/min — ABNORMAL LOW (ref >60)
Glucose, Bld: 119 mg/dL — ABNORMAL HIGH (ref 65–99)
Phosphorus: 5.7 mg/dL — ABNORMAL HIGH (ref 2.5–4.6)
Potassium: 3.6 mmol/L (ref 3.5–5.1)
Sodium: 135 mmol/L (ref 135–145)

## 2016-06-20 LAB — GASTROINTESTINAL PANEL BY PCR, STOOL (REPLACES STOOL CULTURE)

## 2016-06-20 LAB — LACTOFERRIN, FECAL, QUALITATIVE: Lactoferrin, Fecal, Qual: NEGATIVE

## 2016-06-20 LAB — CBC
HCT: 28.1 % — ABNORMAL LOW (ref 39.0–52.0)
Hemoglobin: 9.1 g/dL — ABNORMAL LOW (ref 13.0–17.0)
MCH: 31.5 pg (ref 26.0–34.0)
MCHC: 32.4 g/dL (ref 30.0–36.0)
MCV: 97.2 fL (ref 78.0–100.0)
Platelets: 197 10*3/uL (ref 150–400)
RBC: 2.89 MIL/uL — ABNORMAL LOW (ref 4.22–5.81)
RDW: 21.2 % — ABNORMAL HIGH (ref 11.5–15.5)
WBC: 5.6 10*3/uL (ref 4.0–10.5)

## 2016-06-20 LAB — OCCULT BLOOD X 1 CARD TO LAB, STOOL: Fecal Occult Bld: POSITIVE — AB

## 2016-06-20 MED ORDER — HYDROMORPHONE HCL 1 MG/ML IJ SOLN
1.0000 mg | Freq: Once | INTRAMUSCULAR | Status: AC
  Administered 2016-06-20: 1 mg via INTRAVENOUS

## 2016-06-20 MED ORDER — DIPHENHYDRAMINE HCL 25 MG PO CAPS
25.0000 mg | ORAL_CAPSULE | Freq: Three times a day (TID) | ORAL | Status: DC | PRN
Start: 2016-06-20 — End: 2016-06-22
  Administered 2016-06-20 – 2016-06-22 (×2): 25 mg via ORAL
  Filled 2016-06-20: qty 1

## 2016-06-20 MED ORDER — HYDROMORPHONE HCL 1 MG/ML IJ SOLN
INTRAMUSCULAR | Status: AC
  Filled 2016-06-20: qty 1

## 2016-06-20 NOTE — Procedures (Signed)
Patient seen on Hemodialysis. QB 400, UF goal 3L Treatment adjusted as needed.  Elmarie Shiley MD [facility]. Office # [phone] Pager # [phone] 9:43 AM

## 2016-06-20 NOTE — Discharge Summary (Signed)
[facility] Discharge Summary  Patient name: Jim Wyatt Medical record number: [id number] Date of birth: 1980-08-03 Age: 36 y.o. Gender: male Date of Admission: 06/17/2016  Date of Discharge: 06/22/2016 Admitting Physician: Blane Ohara McDiarmid, MD  Primary Care Provider: [name], MD Consultants: Nephrology  Indication for Hospitalization: Chest pain with shortness of breath  Discharge Diagnoses/Problem List:  ESRD Pericardial effusion Pneumocystitis UroVysion pneumonia Hypertensive urgency with tachycardia Renovascular hypertension Abdominal pain Chronic diarrhea Anemia of chronic disease HIV with AIDS status Seizure disorder Asthma with possible OSA Medical noncompliance History of headaches History of DVT  Disposition: Home  Discharge Condition: Stable, improved  Discharge Exam:  General: sitting up in bed comfortably, well developed, in no acute distress with non-toxic appearance HEENT: normocephalic, atraumatic, moist mucous membranes CV: regular rate and rhythm without murmurs, rubs, or gallops Lungs: clear to auscultation bilaterally with normal work of breathing Abdomen: soft, non-tender, no masses or organomegaly palpable, normoactive bowel sounds Skin: warm, dry, no rashes or lesions, cap refill < 2 seconds Extremities: warm and well perfused, normal tone  Brief Hospital Course:  Jim Timberman Scottonis a 36 y.o.malepresenting with dyspnea. PMH is significant for poorly controlled hypertension, headache, HIV with AIDS, end-stage renal disease on dialysis (T/TH/S) and history of DVT x 1 in 2015 (not on any anti-coagulation), recent diagnosis of PCP PNA, hx of ileocecal intussusception 11/2015.  Pt presented with burning chest pain, shortness of breath, and abdominal pain after flying back from [place]. Pt came to the [facility] ED and began experiencing nonproductive cough. Patient stated last dialysis session was 2 days  prior.  Upon arrival, patient tachycardic with hypertensive urgency. CXR pulmonary vascular congestion. 2 TA negative for PE however consistent with patchy groundglass opacities bilaterally throughout with moderate pericardial effusion without tampon. CT of abdomen/pelvis unremarkable. Patient was taken directly to HD for concern of fluid overload. Nephrology follow patient's care during admission. Chest pain resolved with Tums and Carafate. Troponin negative with unremarkable EKG findings. This was thought to be secondary to GI irritation. Symptoms resolved after several rounds of HD over the course of his hospital stay. Patient was stable for discharge with improved blood pressures on clonidine patch.  Issues for Follow Up:  1. Needs outpatient GI followup for positive FOBT. Chronic diarrhea resolved prior to discharge area and hemoglobin remained stable on discharge, recheck CBC. Chest pain seems to be related to GI irritation. Discharged on Carafate and PPI. May need an EGD to rule out metaplasia. 2. Consider echocardiogram given stable pericardial effusion. 3. Initiated clonidine patch prior to discharge with improved blood pressures while on hydralazine. Please titrate as appropriate at follow-up. 4. Patient to complete prednisone taper on 5/3 and clindamycin for PCP pneumonia from prior admission.  Significant Procedures: HD (4/21, 4/23, 4/24, 425)  Significant Labs and Imaging:   Recent Labs Lab 06/20/16 1010 06/21/16 0938 06/22/16 1313  WBC 5.6 8.1 5.9  HGB 9.1* 10.3* 9.5*  HCT 28.1* 31.0* 28.6*  PLT 197 206 174    Recent Labs Lab 06/19/16 1013 06/20/16 1009 06/21/16 0938 06/22/16 1313  NA 133* 135 134* 130*  K 4.0 3.6 3.9 4.0  CL 93* 97* 97* 94*  CO2 27 27 26 23   GLUCOSE 113* 119* 115* 77  BUN 64* 49* 34* 55*  CREATININE 11.13* 8.46* 6.93* 10.51*  CALCIUM 9.0 9.1 9.4 9.2  PHOS 5.9* 5.7* 5.9* 5.8*  ALBUMIN 2.8* 2.9* 3.0* 3.0*   Lipase: 40 (WNL)  Troponin:  0.19 > 0.18 > 0.16 Lactic acid: 1.70 (WNL) Lactoferrin: Negative FOBT: Positive GI panel: Negative  Imaging/Diagnostic Tests: CT Angio Chest/Abdomen/Pelvis PE W and/or Wo Contrast (06/17/2016) FINDINGS: CTA CHEST FINDINGS Cardiovascular: Cardiomegaly. Moderate pericardial effusion. Aorta and main pulmonary artery are normal in caliber. Limited exam due to contrast bolus. Within the above limitation, no evidence for pulmonary embolus. Mediastinum/Nodes: Stable prominent right hilar lymph node measuring 1.5 cm. No additional mediastinal or hilar adenopathy. The esophagus is normal in appearance. Lungs/Pleura: Central airways are patent. Re- demonstrated extensive bilateral ground-glass pulmonary opacities. Small bilateral pleural effusions. No pneumothorax. Musculoskeletal: Grossly unchanged lucent and sclerotic lesion within the posterior right third rib (image 17; series 7), nonspecific in etiology. No acute process. Review of the MIP images confirms the above findings.  CT ABDOMEN and PELVIS FINDINGS Hepatobiliary: Liver is normal in size and contour. Gallbladder is unremarkable. Pancreas: Unremarkable Spleen: Unremarkable Adrenals/Urinary Tract: Adrenal glands are normal. Kidneys are atrophic bilaterally. Urinary bladder is decompressed. Stomach/Bowel: Postsurgical changes within the right hemiabdomen. There are a few prominent loops of small bowel within the central abdomen measuring up to 3.5 cm. Remainder of the small bowel is difficult to fully assess given lack of intraperitoneal fat. No definite abnormal bowel wall thickening. No free fluid or free intraperitoneal air. Vascular/Lymphatic: Normal caliber abdominal aorta. No retroperitoneal lymphadenopathy. Reproductive: Prostate is unremarkable. Other: Diffuse anasarca. Musculoskeletal: No aggressive or acute appearing osseous lesions. Review of the MIP images confirms the above findings.  IMPRESSION: No evidence for pulmonary  embolus. Extensive patchy ground-glass opacities throughout the lungs bilaterally which may represent edema or atypical infectious process. PCP pneumonia is not excluded. Note somewhat difficult evaluation of the small bowel given lack of oral contrast material and intraperitoneal fat. No definite evidence for bowel obstruction. Mild anasarca. Moderate pericardial effusion.  DG Chest Port 1 View (06/17/2016) FINDINGS: Stable marked cardiomegaly. Interval development of increased pulmonary vascular congestion now with mild diffuse interstitial prominence slightly more confluent in the lung bases. No pleural effusion or pneumothorax.  IMPRESSION: 1. New pulmonary vascular congestion with interstitial opacities bilaterally but more confluent in the lung bases. Differential considerations in interstitial pulmonary edema and an atypical or viral respiratory infection. 2. Stable massive cardiomegaly.  Results/Tests Pending at Time of Discharge: None  Discharge Medications:  Allergies as of 06/22/2016      Reactions   Bactrim [sulfamethoxazole-trimethoprim] Other (See Comments)   Renal failure   Reglan [metoclopramide] Other (See Comments)   Causes ticks/ jerks   Vancomycin Other (See Comments)   Patient states vancomycin caused kidney injury   Metoprolol Hives   Pt reports hives   Amlodipine Palpitations, Other (See Comments)   fatigue   Augmentin [amoxicillin-pot Clavulanate] Hives   Procardia [nifedipine] Hives   Pt reports hives, itching, SOB      Medication List    STOP taking these medications   venlafaxine XR 150 MG 24 hr capsule Commonly known as:  EFFEXOR-XR     TAKE these medications   acetaminophen 500 MG tablet Commonly known as:  TYLENOL Take 1,000 mg by mouth every 6 (six) hours as needed (pain).   albuterol 108 (90 Base) MCG/ACT inhaler Commonly known as:  PROVENTIL HFA;VENTOLIN HFA Inhale 2 puffs into the lungs every 6 (six) hours as needed for wheezing or  shortness of breath.   clindamycin 300 MG capsule Commonly known as:  CLEOCIN Take 2 capsules (600 mg total) by mouth every 8 (eight) hours.   cloNIDine 0.1 mg/24hr patch  Commonly known as:  CATAPRES - Dosed in mg/24 hr Place 1 patch (0.1 mg total) onto the skin once a week. Start taking on:  06/28/2016   dolutegravir 50 MG tablet Commonly known as:  TIVICAY Take 1 tablet (50 mg total) by mouth daily.   emtricitabine-tenofovir AF 200-25 MG tablet Commonly known as:  DESCOVY Take 1 tablet by mouth daily.   hydrALAZINE 50 MG tablet Commonly known as:  APRESOLINE Take 2 tablets (100 mg total) by mouth 3 (three) times daily. What changed:  medication strength  how much to take   levETIRAcetam 250 MG tablet Commonly known as:  KEPPRA Take 1 tablet (250 mg) by mouth on Tuesday, Thursday, Saturday at 7pm What changed:  how much to take  how to take this  when to take this  additional instructions   losartan 100 MG tablet Commonly known as:  COZAAR Take 100 mg by mouth at bedtime.   pantoprazole 40 MG tablet Commonly known as:  PROTONIX Take 1 tablet (40 mg total) by mouth daily.   predniSONE 20 MG tablet Commonly known as:  DELTASONE Take 2 tab before bedtime on 4/6, then 2 tabs twice daily for 1 day, then 2 tabs daily for 5 days, then 1 tab daily for 11 days   primaquine 26.3 MG tablet Take 2 tablets (30 mg total) by mouth daily.   sucralfate 1 GM/10ML suspension Commonly known as:  CARAFATE Take 10 mLs (1 g total) by mouth 4 (four) times daily -  with meals and at bedtime.   topiramate 50 MG tablet Commonly known as:  TOPAMAX Take 1 tablet (50 mg total) by mouth 2 (two) times daily.       Discharge Instructions: Please refer to Patient Instructions section of EMR for full details.  Patient was counseled important signs and symptoms that should prompt return to medical care, changes in medications, dietary instructions, activity restrictions, and follow up  appointments.   Follow-Up Appointments: Follow-up [facility] Follow up on 07/05/2016.   Why:  3 pm for hospital follow up Contact information: [place] [phone] [place], DO 06/25/2016, 2:33 PM PGY-1, [place]

## 2016-06-20 NOTE — Progress Notes (Signed)
Patient ID: Jim Wyatt, male   DOB: Jun 17, 1980, 36 y.o.   MRN: [id number]  [facility] Progress Note   Assessment/ Plan:   1. Volume overload/pulmonary edema: Unfortunately, with pattern of poor compliance with restriction of intradialytic weight control. Addressed compliance again. 2. ESRD: Resumed on a Tuesday/Thursday/Saturday hemodialysis schedule with frequent hemodialysis done during hospitalization to challenge his dry weight. 3. Anemia: Hemoglobin levels borderline low, restart ESA now that blood pressures are better controlled. 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 5. HIV/AIDS: Continue antiretroviral therapy, will further follow-up and management per ID. 6. Hypertension: BP elevated but improving with UF/medications  Subjective:   Reports to be feeling better with improved breathing- now at baseline per him.    Objective:   BP (!) 174/124 (BP Location: Right Arm)   Pulse (!) 112   Temp 98.1 F (36.7 C) (Oral)   Resp 16   Ht 5\' 7"  (1.702 m)   Wt 71.6 kg (157 lb 13.6 oz)   SpO2 100%   BMI 24.72 kg/m   Physical Exam: [id number] to be somewhat comfortable resting in dialysis CVS: Pulse regular tachycardia, normal rhythm Resp: Clear to auscultation bilaterally, no distinct rales or rhonchi Abd: Soft, tenderness over right lower quadrant, guarding noted Ext: Trace lower extremity edema, left radiocephalic fistula with thrill and bruit  Labs: BMET  Recent Labs Lab 06/17/16 1213 06/18/16 0335 06/19/16 1013  NA 133* 136 133*  K 5.1 4.0 4.0  CL 95* 95* 93*  CO2 21* 28 27  GLUCOSE 100* 107* 113*  BUN 87* 39* 64*  CREATININE 11.34* 7.27* 11.13*  CALCIUM 11.0* 9.5 9.0  PHOS  --  4.5 5.9*   CBC  Recent Labs Lab 06/17/16 1213 06/18/16 0335 06/19/16 1008  WBC 8.8 9.7 7.5  HGB 9.2* 9.7* 8.7*  HCT 28.6* 30.8* 27.4*  MCV 94.1 96.6 95.8  PLT 253 300 228   Medications:    . clindamycin  600 mg Oral Q8H   . dolutegravir  50 mg Oral Daily  . emtricitabine-tenofovir AF  1 tablet Oral Daily  . heparin  5,000 Units Subcutaneous Q8H  . hydrALAZINE  100 mg Oral TID  . levETIRAcetam  250 mg Oral Q T,Th,Sat-1800  . levETIRAcetam  500 mg Oral Daily  . losartan  100 mg Oral Daily  . predniSONE  20 mg Oral Q breakfast  . primaquine  30 mg Oral Daily  . topiramate  50 mg Oral BID   Elmarie Shiley, MD 06/20/2016, 9:37 AM

## 2016-06-20 NOTE — Progress Notes (Signed)
Family Medicine Teaching Service Daily Progress Note Intern Pager: [phone]  Patient name: Jim Wyatt Medical record number: [id number] Date of birth: 01-16-1981 Age: 36 y.o. Gender: male  Primary Care Provider: [name], MD Consultants: Nephrology Code Status: Full   Pt Overview and Major Events to Date:  04/21: Admit for dyspnea, HD 04/22: HD 04/23: Extra HD 04/24: HD  Assessment and Plan: Jim Aline Scottonis a 36 y.o.malepresenting with dyspnea. PMH is significant for poorly controlled hypertension, headache, HIV with AIDS, end-stage renal disease on dialysis (T/TH/S) and history of DVT x 1 in 2015 (not on any anti-coagulation), recent diagnosis of PCP PNA, hx of ileocecal intussusception 11/2015.  #ESRD  Dyspnea  Pericardial effusion: Acute. Improved. HD T/TH/S. Weight 157 lbs (dry weight 143 lbs) s/p HD x3. Remains tachycardic 110s and hypertensive 170s/110s. Kidney resolved on 2 L [place]. CTA neg for PE and CXR mod pericardial effusion without tamponade. --Nephrology consulted, appreciate recommendations --HD today --Supplemental O2 if needed, goal O2 >90% --Consider echocardiogram when euvolemic  #Pneumocystis jiroveci pneumonia: Subacute. Resolved. Diagnosed at hospitalization early this month. Per outpatient pharamacy, patient filled medication on 4/15. --Prednisone taper: currently Prednisone 20mg  daily for 11 days starting 4/22, last dose 5/3 --Clindamycin 300 mg q8h and Primaquine 30 mg QD (day 9/18 today)  #Hypertensive urgency I Renovascular hypertension  Tachycardia: Acute on chronic. HR 110's and BP 170s/110s. Current home medications include Hydralazine 75 mg TID, Losartan 100 mg QD. Would add on additional antihypertensive however patient not adherent to options along with history of allergies to amlodipine, Procardia, and metoprolol. --Plan per above --Continue home medications Hydralazine 75 mg TID and Losartan 100 mg QD --Hydralazine 10-20 mg  q2h PRN ordered by nephrology (recieved 40 mg last 24hrs) --Cardiac monitoring  #Abdominal pain  Diarrhea: Acute. Resolved. History of chronic diarrhea with unclear etiology. Last BM last night, well formed. FOBT positive, hgb stable. --Fecal lactoferin and GI pathogen panel pending --Tylenol 1000 mg q6h PRN  #Anemia of Chronic Disease: Hgb stable, baseline ~9.5. FOBT positive, --Daily CBC  #HIV with AIDS status: Chronic. Stable. Last CD4 count 05/30/16 to 20 and HIV RNA Quant's, log 5.243. --Descovy and [place]  #Seizure disorder: Chronic. Stable. Taking keppra daily.  --Keppra 500 mg QD and an additional 250 mg on dialysis days (T/TH/S)  #Asthma  Possible OSA: Takes albuterol when necessary at home. Denies tobacco. Supposed to have sleep study. Does notuse CPAP.  --F/u sleep study and PFTs as an outpatient  #Medical noncompliance: Patient refusing to take recommended antibiotics, antihypertensives, and BiPAP. Explained to patient the risks of refusing, he expresses understanding.  #Hx of Headache: reports of mild HA currently without visual changes. Was recently admitted last month for headache with visual changes.  --Topamax 50 BID --Tylenol PRN  #History of DVT: Bilateral 2015 9/15. Due to immobility. Completed course of anticoagulation. No LE tenderness or edema. CTA chest negative for PE  FEN/GI: renal diet with fluid restriction Prophylaxis: subcutaneous heparin  Disposition: Likely home pending improvement in diuresis and dyspnea.  Subjective:  Patient overall feels better today with resolved headache and abdominal pain. Able to tolerate diet overnight without diarrhea. Asking when he will be discharged.  Objective: Temp:  [97.9 F (36.6 C)-98.8 F (37.1 C)] 98.7 F (37.1 C) (04/24 0408) Pulse Rate:  [107-118] 117 (04/24 0408) Resp:  [11-24] 15 (04/24 0408) BP: (164-199)/(92-128) 171/119 (04/24 0408) SpO2:  [98 %-100 %] 100 % (04/24 0408) Weight:  [152  lb 5.4 oz (69.1 kg)-160 lb 0.9  oz (72.6 kg)] 157 lb 13.6 oz (71.6 kg) (04/24 0500) Physical Exam: General: laying in bed comfortably, well developed, in no acute distress with non-toxic appearance HEENT: normocephalic, atraumatic, moist mucous membranes CV: regular rate and rhythm without murmurs, rubs, or gallops Lungs: clear to auscultation bilaterally with normal work of breathing Abdomen: soft, non-tender, no masses or organomegaly palpable, normoactive bowel sounds Skin: warm, dry, no rashes or lesions, cap refill < 2 seconds Extremities: warm and well perfused, normal tone  Laboratory:  Recent Labs Lab 06/17/16 1213 06/18/16 0335 06/19/16 1008  WBC 8.8 9.7 7.5  HGB 9.2* 9.7* 8.7*  HCT 28.6* 30.8* 27.4*  PLT 253 300 228    Recent Labs Lab 06/17/16 1213 06/18/16 0335 06/19/16 1013  NA 133* 136 133*  K 5.1 4.0 4.0  CL 95* 95* 93*  CO2 21* 28 27  BUN 87* 39* 64*  CREATININE 11.34* 7.27* 11.13*  CALCIUM 11.0* 9.5 9.0  PROT 8.0  --   --   BILITOT 1.1  --   --   ALKPHOS 44  --   --   ALT 17  --   --   AST 33  --   --   GLUCOSE 100* 107* 113*   Lipase: 40 (WNL)  Troponin: 0.19 > 0.18 > 0.16 Lactic acid: 1.70 (WNL) Lactoferrin: Pending FOBT: Positive GI panel: Pending  Imaging/Diagnostic Tests: CT Angio Chest/Abdomen/Pelvis PE W and/or Wo Contrast (06/17/2016) FINDINGS: CTA CHEST FINDINGS Cardiovascular: Cardiomegaly. Moderate pericardial effusion. Aorta and main pulmonary artery are normal in caliber. Limited exam due to contrast bolus. Within the above limitation, no evidence for pulmonary embolus. Mediastinum/Nodes: Stable prominent right hilar lymph node measuring 1.5 cm. No additional mediastinal or hilar adenopathy. The esophagus is normal in appearance. Lungs/Pleura: Central airways are patent. Re- demonstrated extensive bilateral ground-glass pulmonary opacities. Small bilateral pleural effusions. No pneumothorax. Musculoskeletal: Grossly unchanged lucent  and sclerotic lesion within the posterior right third rib (image 17; series 7), nonspecific in etiology. No acute process. Review of the MIP images confirms the above findings.  CT ABDOMEN and PELVIS FINDINGS Hepatobiliary: Liver is normal in size and contour. Gallbladder is unremarkable. Pancreas: Unremarkable Spleen: Unremarkable Adrenals/Urinary Tract: Adrenal glands are normal. Kidneys are atrophic bilaterally. Urinary bladder is decompressed. Stomach/Bowel: Postsurgical changes within the right hemiabdomen. There are a few prominent loops of small bowel within the central abdomen measuring up to 3.5 cm. Remainder of the small bowel is difficult to fully assess given lack of intraperitoneal fat. No definite abnormal bowel wall thickening. No free fluid or free intraperitoneal air. Vascular/Lymphatic: Normal caliber abdominal aorta. No retroperitoneal lymphadenopathy. Reproductive: Prostate is unremarkable. Other: Diffuse anasarca. Musculoskeletal: No aggressive or acute appearing osseous lesions. Review of the MIP images confirms the above findings.  IMPRESSION: No evidence for pulmonary embolus. Extensive patchy ground-glass opacities throughout the lungs bilaterally which may represent edema or atypical infectious process. PCP pneumonia is not excluded. Note somewhat difficult evaluation of the small bowel given lack of oral contrast material and intraperitoneal fat. No definite evidence for bowel obstruction. Mild anasarca. Moderate pericardial effusion.  DG Chest Port 1 View (06/17/2016) FINDINGS: Stable marked cardiomegaly. Interval development of increased pulmonary vascular congestion now with mild diffuse interstitial prominence slightly more confluent in the lung bases. No pleural effusion or pneumothorax.  IMPRESSION: 1. New pulmonary vascular congestion with interstitial opacities bilaterally but more confluent in the lung bases. Differential considerations in interstitial  pulmonary edema and an atypical or viral respiratory infection. 2. Stable massive  cardiomegaly.  Miramar Bing, DO 06/20/2016, 7:58 AM PGY-1, [place] Intern pager: [phone], text pages welcome

## 2016-06-21 ENCOUNTER — Other Ambulatory Visit: Payer: Self-pay

## 2016-06-21 LAB — CBC
HCT: 31 % — ABNORMAL LOW (ref 39.0–52.0)
Hemoglobin: 10.3 g/dL — ABNORMAL LOW (ref 13.0–17.0)
MCH: 32.1 pg (ref 26.0–34.0)
MCHC: 33.2 g/dL (ref 30.0–36.0)
MCV: 96.6 fL (ref 78.0–100.0)
Platelets: 206 10*3/uL (ref 150–400)
RBC: 3.21 MIL/uL — ABNORMAL LOW (ref 4.22–5.81)
RDW: 20.9 % — ABNORMAL HIGH (ref 11.5–15.5)
WBC: 8.1 10*3/uL (ref 4.0–10.5)

## 2016-06-21 LAB — RENAL FUNCTION PANEL
Albumin: 3 g/dL — ABNORMAL LOW (ref 3.5–5.0)
Anion gap: 11 (ref 5–15)
BUN: 34 mg/dL — ABNORMAL HIGH (ref 6–20)
CO2: 26 mmol/L (ref 22–32)
Calcium: 9.4 mg/dL (ref 8.9–10.3)
Chloride: 97 mmol/L — ABNORMAL LOW (ref 101–111)
Creatinine, Ser: 6.93 mg/dL — ABNORMAL HIGH (ref 0.61–1.24)
GFR calc Af Amer: 11 mL/min — ABNORMAL LOW (ref >60)
GFR calc non Af Amer: 9 mL/min — ABNORMAL LOW (ref >60)
Glucose, Bld: 115 mg/dL — ABNORMAL HIGH (ref 65–99)
Phosphorus: 5.9 mg/dL — ABNORMAL HIGH (ref 2.5–4.6)
Potassium: 3.9 mmol/L (ref 3.5–5.1)
Sodium: 134 mmol/L — ABNORMAL LOW (ref 135–145)

## 2016-06-21 MED ORDER — SEVELAMER CARBONATE 0.8 G PO PACK
1.6000 g | PACK | Freq: Three times a day (TID) | ORAL | Status: DC
  Administered 2016-06-21 – 2016-06-22 (×2): 1.6 g via ORAL
  Filled 2016-06-21 (×5): qty 2

## 2016-06-21 MED ORDER — PANTOPRAZOLE SODIUM 40 MG PO TBEC
40.0000 mg | DELAYED_RELEASE_TABLET | Freq: Every day | ORAL | Status: DC
  Administered 2016-06-21 – 2016-06-22 (×2): 40 mg via ORAL
  Filled 2016-06-21 (×2): qty 1

## 2016-06-21 MED ORDER — CLONIDINE HCL 0.1 MG/24HR TD PTWK
0.1000 mg | MEDICATED_PATCH | TRANSDERMAL | Status: DC
  Administered 2016-06-21: 0.1 mg via TRANSDERMAL
  Filled 2016-06-21: qty 1

## 2016-06-21 MED ORDER — NITROGLYCERIN 0.4 MG SL SUBL
SUBLINGUAL_TABLET | SUBLINGUAL | Status: AC
  Filled 2016-06-21: qty 1

## 2016-06-21 MED ORDER — DOXAZOSIN MESYLATE 2 MG PO TABS
4.0000 mg | ORAL_TABLET | Freq: Every day | ORAL | Status: DC
  Administered 2016-06-21: 4 mg via ORAL
  Filled 2016-06-21: qty 1

## 2016-06-21 MED ORDER — SUCRALFATE 1 GM/10ML PO SUSP
1.0000 g | Freq: Three times a day (TID) | ORAL | Status: DC
  Administered 2016-06-21 – 2016-06-22 (×4): 1 g via ORAL
  Filled 2016-06-21 (×4): qty 10

## 2016-06-21 MED ORDER — GI COCKTAIL ~~LOC~~
30.0000 mL | Freq: Once | ORAL | Status: AC
  Administered 2016-06-21: 30 mL via ORAL
  Filled 2016-06-21: qty 30

## 2016-06-21 NOTE — Progress Notes (Signed)
Patient complained of pain in mid-right chest, unaffected by breathing, not reproducible, described as burning sensation 8/10 on pain scale.  EKG complete, patient refused nitro and opted to take available Tylenol.  On call Yisroel Ramming has been made aware. Will continue to monitor.

## 2016-06-21 NOTE — Care Management Note (Signed)
Case Management Note  Patient Details  Name: Jim Wyatt MRN: [id number] Date of Birth: 1981-02-20  Subjective/Objective:  From home with a room mate,pta indep,  Hx ESRD, presents with acute respiratory failure,  pcp pna, pericardial effusion, abd pain, anemia of chronic dz, 042, seizure d/o.  He has medication coverage and transport at dc.  He has a PCP, Dr. Doreene Burke at [facility] clinic.   4/25 [place], BSN - c/o chest pain, and bp elevated, received iv hydralazine. hopeful for dc tomorrow.                  Action/Plan: NCM will follow for dc needs.  Expected Discharge Date:  06/19/16               Expected Discharge Plan:  Home/Self Care  In-House Referral:     Discharge planning Services  CM Consult  Post Acute Care Choice:    Choice offered to:     DME Arranged:    DME Agency:     HH Arranged:    HH Agency:     Status of Service:  Completed, signed off  If discussed at [employer] of Stay Meetings, dates discussed:    Additional Comments:  Zenon Mayo, RN 06/21/2016, 3:22 PM

## 2016-06-21 NOTE — Progress Notes (Signed)
 [facility] Progress Note   Subjective: "My Blood pressure is up..I don't know why!" No C/Os. Appears sl lethargic, denies SOB. Cooperative at present. C/O epigastric burning/pain. Given cocktail without results.   Objective Vitals:   06/21/16 0406 06/21/16 0625 06/21/16 0813 06/21/16 1005  BP: (!) 153/118 (!) 175/124  (!) 182/126  Pulse:      Resp: (!) 21 18    Temp: 98.8 F (37.1 C)  98.4 F (36.9 C)   TempSrc: Oral  Oral   SpO2:      Weight:      Height:       Physical Exam General:WNWD NAD Heart: S1,S2, + S4 ST on montior Lungs: CTAB A/P Abdomen: active BS Extremities: No LE Edema Dialysis Access: LFA AVF + Bruit   Additional Objective Labs: Basic Metabolic Panel:  Recent Labs Lab 06/18/16 0335 06/19/16 1013 06/20/16 1009  NA 136 133* 135  K 4.0 4.0 3.6  CL 95* 93* 97*  CO2 28 27 27   GLUCOSE 107* 113* 119*  BUN 39* 64* 49*  CREATININE 7.27* 11.13* 8.46*  CALCIUM 9.5 9.0 9.1  PHOS 4.5 5.9* 5.7*   Liver Function Tests:  Recent Labs Lab 06/17/16 1213 06/18/16 0335 06/19/16 1013 06/20/16 1009  AST 33  --   --   --   ALT 17  --   --   --   ALKPHOS 44  --   --   --   BILITOT 1.1  --   --   --   PROT 8.0  --   --   --   ALBUMIN 3.4* 3.3* 2.8* 2.9*    Recent Labs Lab 06/17/16 1213  LIPASE 40   CBC:  Recent Labs Lab 06/17/16 1213 06/18/16 0335 06/19/16 1008 06/20/16 1010 06/21/16 0938  WBC 8.8 9.7 7.5 5.6 8.1  HGB 9.2* 9.7* 8.7* 9.1* 10.3*  HCT 28.6* 30.8* 27.4* 28.1* 31.0*  MCV 94.1 96.6 95.8 97.2 96.6  PLT 253 300 228 197 PENDING   Blood Culture    Component Value Date/Time   SDES URINE, RANDOM 06/02/2016 0753   SPECREQUEST NONE 06/02/2016 0753   CULT >=100,000 COLONIES/mL ENTEROCOCCUS FAECALIS (A) 06/02/2016 0753   REPTSTATUS 06/04/2016 FINAL 06/02/2016 0753    Cardiac Enzymes:  Recent Labs Lab 06/17/16 2302 06/18/16 0335 06/18/16 0904  TROPONINI 0.19* 0.18* 0.16*   CBG: No results for input(s):  GLUCAP in the last 168 hours. Iron Studies: No results for input(s): IRON, TIBC, TRANSFERRIN, FERRITIN in the last 72 hours. @lablastinr3 @ Studies/Results: No results found. Medications:  . clindamycin  600 mg Oral Q8H  . dolutegravir  50 mg Oral Daily  . emtricitabine-tenofovir AF  1 tablet Oral Daily  . heparin  5,000 Units Subcutaneous Q8H  . hydrALAZINE  100 mg Oral TID  . levETIRAcetam  250 mg Oral Q T,Th,Sat-1800  . levETIRAcetam  500 mg Oral Daily  . losartan  100 mg Oral Daily  . pantoprazole  40 mg Oral Daily  . predniSONE  20 mg Oral Q breakfast  . primaquine  30 mg Oral Daily  . topiramate  50 mg Oral BID    HD orders: [place] T,TH,S  3.75 Hr 64.5 kg  2.0 K/ 2.0 Ca 450/800 No heparin Mircera 225 mcg IV q 2 weeks Last dose 03/10 Hectoral 6 mcg IV TIW  Assessment/Plan: 1. Volume overload/Pulmonary Edema: Non-adherent to fluid restrictions. Continue to reinforce. 2. ESRD -TTS, Next HD tomorrow.  3. Anemia - HGB 10.3 No ESA needed.  Follow HGB 4. Secondary hyperparathyroidism - Phos 5.7 CA 9. Takes Renvela binders-phos creeping up-will restart, Cont VDRA 5. HTN/volume - BP elevated. 186/126. Give PRN IV hydralazine now. Cont losartan, hydralazine PO,has catapres transderm patch. Add doxazosin 4 mg PO daily. Continue to lower volume with HD. Last wt 69.5 kg still 2.5 kg above OP EDW.  6. Nutrition - Albumin 2.9. Refuses Nepro/Prostat. Ask dietician for assistance.   Yilia Sacca H. Andreal Vultaggio NP-C 06/21/2016, 10:49 AM  [employer] [phone]

## 2016-06-21 NOTE — Progress Notes (Signed)
New Admission Note:  Arrival Method: Bed Mental Orientation: Alert and oriented X 4 Telemetry: Box 22 Assessment: Completed Skin: Dry and warm. Bumps to chest  IV: NSL  Pain: Denies  Tubes: N/A Safety Measures: Safety Fall Prevention Plan was given, discussed and signed. Admission: Completed [facility] [place] Orientation: Patient has been orientated to the room, unit and the staff. Family: None   Orders have been reviewed and implemented. Will continue to monitor the patient. Call light has been placed within reach and bed alarm has been activated.   Sima Matas BSN, RN  Phone Number: [phone]

## 2016-06-21 NOTE — Progress Notes (Signed)
Patient continues to complain of burning sensation in his chest.  FMTS resident notified and stated that medication will be ordered for the patient but no IV pain medication will be ordered. Will await orders and continue to monitor the patient.

## 2016-06-21 NOTE — Progress Notes (Signed)
FPTS Interim Progress Note  S: Called by nursing - patient still with intermittent complaints of burning chest pain, particularly when family was visiting this evening. Patient seen and examined at bedside. He was sleeping comfortably. Vitals stable on room air though tachycardic (has been tachy since admission). BP improved.  Woke patient for examination. When specifically asked he endorses mild chest pain (4/10), burning in nature and does not radiate, no dyspnea. Patient wants to go back to sleep.   O: BP (!) 126/55   Pulse 98   Temp 98.3 F (36.8 C) (Oral)   Resp 13   Ht 5\' 7"  (1.702 m)   Wt 153 lb 3.5 oz (69.5 kg)   SpO2 99%   BMI 24.00 kg/m   GEN: 36 yo rests comfortably in bed, no apparent distress, vitals stable CARD: +mildly tachycardic, regular rhythm, no m/r/g, no costochondral tenderness PULM: CTA bil, dec lung sounds at bil bases, comfortable work of breathing, speaks in full sentances ABD: soft and nontender Neuro: alert and oriented x3  A/P: - patient is sleeping, appears very comfortable, will continue to monitor pain at this point - Pain is atypical and does not seem cardiac in nature, EKG  that was completed at time of onset of pain unchanged from previous - PE considered given tachycardia and CP, however less likely given CTA from 4/21 negative for PE - overnight if patient has vitals instability or more severe pain suggestive of a new acute process, can consider CXR - considering GERD, can continue PPI trial and PRN GI cocktail, consider outpatient GI referral for EGD   Everrett Coombe, MD 06/21/2016, 7:29 PM PGY-1, [place] Medicine Service pager [phone]

## 2016-06-21 NOTE — Progress Notes (Signed)
Family Medicine Teaching Service Daily Progress Note Intern Pager: [phone]  Patient name: Jim Wyatt Medical record number: [id number] Date of birth: 06-22-1980 Age: 36 y.o. Gender: male  Primary Care Provider: [name], MD Consultants: Nephrology Code Status: Full   Pt Overview and Major Events to Date:  04/21: Admit for dyspnea, HD 04/23: Extra HD 04/24: HD  Assessment and Plan: Jim Wyatt a 36 y.o.malepresenting with dyspnea. PMH is significant for poorly controlled hypertension, headache, HIV with AIDS, end-stage renal disease on dialysis (T/TH/S) and history of DVT x 1 in 2015 (not on any anti-coagulation), recent diagnosis of PCP PNA, hx of ileocecal intussusception 11/2015.  #ESRD  Dyspnea  Pericardial effusion: Acute. Improved. HD T/TH/S. Weight 153 lbs (dry weight 143 lbs) s/p HD x3. Remains tachycardic 110s and hypertensive 170s/110s. No increased WOB on RA. CTA neg for PE and CXR mod pericardial effusion without tamponade. --Nephrology consulted, appreciate recommendations --Supplemental O2 if needed, goal O2 >90%  #Pneumocystis jiroveci pneumonia: Subacute. Resolved. Diagnosed at hospitalization early this month. Per outpatient pharamacy, patient filled medication on 4/15. --Prednisone taper: currently Prednisone 20mg  daily for 11 days starting 4/22, last dose 5/3 --Clindamycin 300 mg q8h and Primaquine 30 mg QD (day 10/18 today)  #Hypertensive urgency I Renovascular hypertension  Tachycardia: Acute on chronic. HR 110's and BP 170s/110s. Current home medications include Hydralazine 75 mg TID, Losartan 100 mg QD. Would add on additional antihypertensive however patient not adherent to options along with history of allergies to amlodipine, Procardia, and metoprolol. --Plan per above --Continue home medications Hydralazine 75 mg TID and Losartan 100 mg QD --Hydralazine 10-20 mg q2h PRN ordered by nephrology (recieved 40 mg last 24hrs) --Cardiac  monitoring  #Abdominal pain  Diarrhea: Acute. Resolved. History of chronic diarrhea with unclear etiology. Last BM last night, well formed. FOBT positive, hgb stable. GI panel and lactoferrin negative. --Tylenol 1000 mg q6h PRN  #Anemia of Chronic Disease: Hgb stable, baseline ~9.5. FOBT positive, --Daily CBC  #HIV with AIDS status: Chronic. Stable. Last CD4 count 05/30/16 to 20 and HIV RNA Quant's, log 5.243. --Descovy and Tivicay  #Seizure disorder: Chronic. Stable. Taking keppra daily.  --Keppra 500 mg QD and an additional 250 mg on dialysis days (T/TH/S)  #Asthma  Possible OSA: Takes albuterol when necessary at home. Denies tobacco. Supposed to have sleep study. Does notuse CPAP.  --F/u sleep study and PFTs as an outpatient  #Medical noncompliance: Patient refusing to take recommended antibiotics, antihypertensives, and BiPAP. Explained to patient the risks of refusing, he expresses understanding.  #Hx of Headache: reports of mild HA currently without visual changes. Was recently admitted last month for headache with visual changes.  --Topamax 50 BID --Tylenol PRN  #History of DVT: Bilateral 2015 9/15. Due to immobility. Completed course of anticoagulation. No LE tenderness or edema. CTA chest negative for PE  FEN/GI: Renal diet with fluid restriction Prophylaxis: Heparin SQ  Disposition: Likely home pending improvement in diuresis.  Subjective:  Patient states he has having burning sensation in his epigastric region radiating to his stomach. Pain is nonreproducible and he is not short of breath or nauseous. Tylenol without effect.   Objective: Temp:  [97.9 F (36.6 C)-98.9 F (37.2 C)] 98.4 F (36.9 C) (04/25 0813) Pulse Rate:  [100-118] 110 (04/24 1601) Resp:  [12-21] 18 (04/25 0625) BP: (142-197)/(71-126) 175/124 (04/25 0625) SpO2:  [98 %-100 %] 99 % (04/24 2346) Weight:  [151 lb 0.2 oz (68.5 kg)-157 lb 10.1 oz (71.5 kg)] 153 lb 3.5 oz (  69.5 kg) (04/25  0400) Physical Exam: General: sitting up in bed comfortably, well developed, in no acute distress with non-toxic appearance HEENT: normocephalic, atraumatic, moist mucous membranes CV: regular rate and rhythm without murmurs, rubs, or gallops Lungs: clear to auscultation bilaterally with normal work of breathing Abdomen: soft, non-tender, no masses or organomegaly palpable, normoactive bowel sounds Skin: warm, dry, no rashes or lesions, cap refill < 2 seconds Extremities: warm and well perfused, normal tone  Laboratory:  Recent Labs Lab 06/18/16 0335 06/19/16 1008 06/20/16 1010  WBC 9.7 7.5 5.6  HGB 9.7* 8.7* 9.1*  HCT 30.8* 27.4* 28.1*  PLT 300 228 197    Recent Labs Lab 06/17/16 1213 06/18/16 0335 06/19/16 1013 06/20/16 1009  NA 133* 136 133* 135  K 5.1 4.0 4.0 3.6  CL 95* 95* 93* 97*  CO2 21* 28 27 27   BUN 87* 39* 64* 49*  CREATININE 11.34* 7.27* 11.13* 8.46*  CALCIUM 11.0* 9.5 9.0 9.1  PROT 8.0  --   --   --   BILITOT 1.1  --   --   --   ALKPHOS 44  --   --   --   ALT 17  --   --   --   AST 33  --   --   --   GLUCOSE 100* 107* 113* 119*   Lipase: 40 (WNL)  Troponin: 0.19 > 0.18 > 0.16 Lactic acid: 1.70 (WNL) Lactoferrin: Negative FOBT: Positive GI panel: Negative  Imaging/Diagnostic Tests: CT Angio Chest/Abdomen/Pelvis PE W and/or Wo Contrast (06/17/2016) FINDINGS: CTA CHEST FINDINGS Cardiovascular: Cardiomegaly. Moderate pericardial effusion. Aorta and main pulmonary artery are normal in caliber. Limited exam due to contrast bolus. Within the above limitation, no evidence for pulmonary embolus. Mediastinum/Nodes: Stable prominent right hilar lymph node measuring 1.5 cm. No additional mediastinal or hilar adenopathy. The esophagus is normal in appearance. Lungs/Pleura: Central airways are patent. Re- demonstrated extensive bilateral ground-glass pulmonary opacities. Small bilateral pleural effusions. No pneumothorax. Musculoskeletal: Grossly unchanged  lucent and sclerotic lesion within the posterior right third rib (image 17; series 7), nonspecific in etiology. No acute process. Review of the MIP images confirms the above findings.  CT ABDOMEN and PELVIS FINDINGS Hepatobiliary: Liver is normal in size and contour. Gallbladder is unremarkable. Pancreas: Unremarkable Spleen: Unremarkable Adrenals/Urinary Tract: Adrenal glands are normal. Kidneys are atrophic bilaterally. Urinary bladder is decompressed. Stomach/Bowel: Postsurgical changes within the right hemiabdomen. There are a few prominent loops of small bowel within the central abdomen measuring up to 3.5 cm. Remainder of the small bowel is difficult to fully assess given lack of intraperitoneal fat. No definite abnormal bowel wall thickening. No free fluid or free intraperitoneal air. Vascular/Lymphatic: Normal caliber abdominal aorta. No retroperitoneal lymphadenopathy. Reproductive: Prostate is unremarkable. Other: Diffuse anasarca. Musculoskeletal: No aggressive or acute appearing osseous lesions. Review of the MIP images confirms the above findings.  IMPRESSION: No evidence for pulmonary embolus. Extensive patchy ground-glass opacities throughout the lungs bilaterally which may represent edema or atypical infectious process. PCP pneumonia is not excluded. Note somewhat difficult evaluation of the small bowel given lack of oral contrast material and intraperitoneal fat. No definite evidence for bowel obstruction. Mild anasarca. Moderate pericardial effusion.  DG Chest Port 1 View (06/17/2016) FINDINGS: Stable marked cardiomegaly. Interval development of increased pulmonary vascular congestion now with mild diffuse interstitial prominence slightly more confluent in the lung bases. No pleural effusion or pneumothorax.  IMPRESSION: 1. New pulmonary vascular congestion with interstitial opacities bilaterally but more confluent in  the lung bases. Differential considerations in  interstitial pulmonary edema and an atypical or viral respiratory infection. 2. Stable massive cardiomegaly.  Savanna Bing, DO 06/21/2016, 8:33 AM PGY-1, [place] Intern pager: [phone], text pages welcome

## 2016-06-22 LAB — CBC
HCT: 28.6 % — ABNORMAL LOW (ref 39.0–52.0)
Hemoglobin: 9.5 g/dL — ABNORMAL LOW (ref 13.0–17.0)
MCH: 31.6 pg (ref 26.0–34.0)
MCHC: 33.2 g/dL (ref 30.0–36.0)
MCV: 95 fL (ref 78.0–100.0)
Platelets: 174 10*3/uL (ref 150–400)
RBC: 3.01 MIL/uL — ABNORMAL LOW (ref 4.22–5.81)
RDW: 20.5 % — ABNORMAL HIGH (ref 11.5–15.5)
WBC: 5.9 10*3/uL (ref 4.0–10.5)

## 2016-06-22 LAB — RENAL FUNCTION PANEL
Albumin: 3 g/dL — ABNORMAL LOW (ref 3.5–5.0)
Anion gap: 13 (ref 5–15)
BUN: 55 mg/dL — ABNORMAL HIGH (ref 6–20)
CO2: 23 mmol/L (ref 22–32)
Calcium: 9.2 mg/dL (ref 8.9–10.3)
Chloride: 94 mmol/L — ABNORMAL LOW (ref 101–111)
Creatinine, Ser: 10.51 mg/dL — ABNORMAL HIGH (ref 0.61–1.24)
GFR calc Af Amer: 7 mL/min — ABNORMAL LOW (ref >60)
GFR calc non Af Amer: 6 mL/min — ABNORMAL LOW (ref >60)
Glucose, Bld: 77 mg/dL (ref 65–99)
Phosphorus: 5.8 mg/dL — ABNORMAL HIGH (ref 2.5–4.6)
Potassium: 4 mmol/L (ref 3.5–5.1)
Sodium: 130 mmol/L — ABNORMAL LOW (ref 135–145)

## 2016-06-22 MED ORDER — PANTOPRAZOLE SODIUM 40 MG PO TBEC: 40.0000 mg | DELAYED_RELEASE_TABLET | Freq: Every day | ORAL | 0 refills | Status: DC

## 2016-06-22 MED ORDER — ALBUTEROL SULFATE HFA 108 (90 BASE) MCG/ACT IN AERS: 2.0000 | INHALATION_SPRAY | Freq: Four times a day (QID) | RESPIRATORY_TRACT | 0 refills | Status: DC | PRN

## 2016-06-22 MED ORDER — CLONIDINE HCL 0.1 MG/24HR TD PTWK: 0.1000 mg | MEDICATED_PATCH | TRANSDERMAL | 0 refills | Status: DC

## 2016-06-22 MED ORDER — SUCRALFATE 1 GM/10ML PO SUSP: 1.0000 g | Freq: Three times a day (TID) | ORAL | 0 refills | Status: DC

## 2016-06-22 MED ORDER — CLINDAMYCIN HCL 300 MG PO CAPS: 600.0000 mg | ORAL_CAPSULE | Freq: Three times a day (TID) | ORAL | 0 refills | Status: DC

## 2016-06-22 MED ORDER — TOPIRAMATE 50 MG PO TABS: 50.0000 mg | ORAL_TABLET | Freq: Two times a day (BID) | ORAL | 0 refills | Status: DC

## 2016-06-22 MED ORDER — HYDRALAZINE HCL 50 MG PO TABS: 100.0000 mg | ORAL_TABLET | Freq: Three times a day (TID) | ORAL | 0 refills | Status: DC

## 2016-06-22 MED ORDER — HYDRALAZINE HCL 25 MG PO TABS: 100.0000 mg | ORAL_TABLET | Freq: Three times a day (TID) | ORAL | 0 refills | Status: DC

## 2016-06-22 MED ORDER — BOOST / RESOURCE BREEZE PO LIQD: 1.0000 | Freq: Three times a day (TID) | ORAL | Status: DC

## 2016-06-22 MED ORDER — HYDROMORPHONE HCL 1 MG/ML IJ SOLN
1.0000 mg | Freq: Once | INTRAMUSCULAR | Status: AC
  Administered 2016-06-22: 1 mg via INTRAVENOUS

## 2016-06-22 MED ORDER — PRIMAQUINE PHOSPHATE 26.3 MG PO TABS: 30.0000 mg | ORAL_TABLET | Freq: Every day | ORAL | 0 refills | Status: DC

## 2016-06-22 MED ORDER — HYDROMORPHONE HCL 1 MG/ML IJ SOLN
INTRAMUSCULAR | Status: AC
  Filled 2016-06-22: qty 1

## 2016-06-22 MED ORDER — DIPHENHYDRAMINE HCL 25 MG PO CAPS
ORAL_CAPSULE | ORAL | Status: AC
  Administered 2016-06-22: 25 mg via ORAL
  Filled 2016-06-22: qty 1

## 2016-06-22 NOTE — Progress Notes (Signed)
Patient being charged to home with family. All Scripts reviewed. All discharge instructions. Work note given. IV removed. Telemetry removed. Patient left unit in stable condition with all belongings in tow.   Sheliah Plane RN

## 2016-06-22 NOTE — Progress Notes (Signed)
Initial Nutrition Assessment  DOCUMENTATION CODES:   Not applicable  INTERVENTION:    Boost Breeze po TID, each supplement provides 250 kcal and 9 grams of protein  NUTRITION DIAGNOSIS:   Increased nutrient needs related to chronic illness as evidenced by estimated needs  GOAL:   Patient will meet greater than or equal to 90% of their needs  MONITOR:   PO intake, Supplement acceptance, Labs, Weight trends, I & O's  REASON FOR ASSESSMENT:   Consult Assessment of nutrition requirement/status  ASSESSMENT:   36 y.o.Malepresenting with dyspnea. PMH is significant for poorly controlled hypertension, headache, HIV with AIDS, end-stage renal disease on dialysis (T/TH/S) and history of DVT x 1 in 2015 (not on any anti-coagulation), recent diagnosis of PCP PNA, hx of ileocecal intussusception 11/2015.  Pt minimally conversant with RD.  Resting in bed with all lights out. PO intake variable at 0-100% per flowsheet records. Not taking Nepro Shakes or Prostat liquid protein. Labs and medications reviewed. Likely home today after HD.  Unable to complete Nutrition-Focused physical exam at this time.   Diet Order:  Diet renal with fluid restriction Fluid restriction: Other (see comments); Room service appropriate? Yes; Fluid consistency: Thin  Skin:  Reviewed, no issues  Last BM:  4/25  Height:   Ht Readings from Last 1 Encounters:  06/21/16 5\' 7"  (1.702 m)    Weight:   Wt Readings from Last 1 Encounters:  06/21/16 154 lb 8.7 oz (70.1 kg)    Ideal Body Weight:  67.2 kg  BMI:  Body mass index is 24.2 kg/m.  Estimated Nutritional Needs:   Kcal:  2100-2300  Protein:  105-115 gm  Fluid:  1200 ml  EDUCATION NEEDS:   No education needs identified at this time  Arthur Holms, RD, LDN Pager #: [phone] After-Hours Pager #: [phone]

## 2016-06-22 NOTE — Progress Notes (Signed)
Family Medicine Teaching Service Daily Progress Note Intern Pager: [phone]  Patient name: Jim Wyatt Medical record number: [id number] Date of birth: 08/25/1980 Age: 36 y.o. Gender: male  Primary Care Provider: [name], MD Consultants: Nephrology Code Status: Full   Pt Overview and Major Events to Date:  04/21: Admit for dyspnea, HD 04/23: Extra HD 04/24: HD  Assessment and Plan: Jim Wyatt a 36 y.o.malepresenting with dyspnea. PMH is significant for poorly controlled hypertension, headache, HIV with AIDS, end-stage renal disease on dialysis (T/TH/S) and history of DVT x 1 in 2015 (not on any anti-coagulation), recent diagnosis of PCP PNA, hx of ileocecal intussusception 11/2015.  #ESRD  Dyspnea  Pericardial effusion: Acute. Improved. HD T/TH/S. Weight 154 lbs (dry weight 143 lbs) s/p HD x3. Remains tachycardic but improved at low 100s, BP 110/80s. No increased WOB on RA. CTA neg for PE and CXR mod pericardial effusion without tamponade. --Nephrology consulted, appreciate recommendations --Supplemental O2 if needed, goal O2 >90%  #Pneumocystis jiroveci pneumonia: Subacute. Resolved. Diagnosed at hospitalization early this month. Per outpatient pharamacy, patient filled medication on 4/15. --Prednisone taper: currently Prednisone 20 mg daily for 11 days starting 4/22, last dose 5/3 --Clindamycin 300 mg q8h and Primaquine 30 mg QD (day 11/18 today)  #Hypertensive urgency I Renovascular hypertension  Tachycardia: Acute on chronic. Improved. Current home medications include Hydralazine 75 mg TID, Losartan 100 mg QD. Stopping doxazosin to help prevent hypotension since starting clonidine. --Plan per above --Hydralazine 75 mg TID and Losartan 100 mg QD, and clonidine patch 0.1 mg, d/c doxazosin 4 mg QD --Cardiac monitoring  #Abdominal pain  Diarrhea: Acute. Resolved. History of chronic diarrhea with unclear etiology. Last BM last night, well formed. FOBT  positive, hgb stable. GI panel and lactoferrin negative.  #Anemia of Chronic Disease: Hgb stable, baseline ~9.5. FOBT positive, --Daily CBC  #HIV with AIDS status: Chronic. Stable. Last CD4 count 05/30/16 to 20 and HIV RNA Quant's, log 5.243. --Descovy and Tivicay  #Seizure disorder: Chronic. Stable. Taking keppra daily.  --Keppra 500 mg QD and an additional 250 mg on dialysis days (T/TH/S)  #Asthma  Possible OSA: Takes albuterol when necessary at home. Denies tobacco. Supposed to have sleep study. Does notuse CPAP.  --F/u sleep study and PFTs as an outpatient  #Medical noncompliance: Patient refusing to take recommended antibiotics, antihypertensives, and BiPAP. Explained to patient the risks of refusing, he expresses understanding.  #Hx of Headache: reports of mild HA currently without visual changes. Was recently admitted last month for headache with visual changes.  --Topamax 50 BID --Tylenol PRN  #History of DVT: Bilateral 2015 9/15. Due to immobility. Completed course of anticoagulation. No LE tenderness or edema. CTA chest negative for PE  FEN/GI: Renal diet with fluid restriction Prophylaxis: Heparin SQ  Disposition: Likely home later today after HD.  Subjective:  Patient states chest burning has improved with Carafate. Says he would like to leave after HD today. Prefers to stay on clonidine patch over doxazosin. No other complaints.  Objective: Temp:  [98.1 F (36.7 C)-98.6 F (37 C)] 98.5 F (36.9 C) (04/26 0436) Pulse Rate:  [98-118] 106 (04/26 0436) Resp:  [10-23] 18 (04/26 0436) BP: (106-184)/(46-130) 106/86 (04/26 0436) SpO2:  [99 %-100 %] 100 % (04/26 0436) Weight:  [154 lb 8.7 oz (70.1 kg)] 154 lb 8.7 oz (70.1 kg) (04/25 2049) Physical Exam: General: sitting up in bed comfortably, well developed, in no acute distress with non-toxic appearance HEENT: normocephalic, atraumatic, moist mucous membranes CV: regular rate and  rhythm without murmurs, rubs,  or gallops Lungs: clear to auscultation bilaterally with normal work of breathing Abdomen: soft, non-tender, no masses or organomegaly palpable, normoactive bowel sounds Skin: warm, dry, no rashes or lesions, cap refill < 2 seconds Extremities: warm and well perfused, normal tone  Laboratory:  Recent Labs Lab 06/19/16 1008 06/20/16 1010 06/21/16 0938  WBC 7.5 5.6 8.1  HGB 8.7* 9.1* 10.3*  HCT 27.4* 28.1* 31.0*  PLT 228 197 206    Recent Labs Lab 06/17/16 1213  06/19/16 1013 06/20/16 1009 06/21/16 0938  NA 133*  < > 133* 135 134*  K 5.1  < > 4.0 3.6 3.9  CL 95*  < > 93* 97* 97*  CO2 21*  < > 27 27 26   BUN 87*  < > 64* 49* 34*  CREATININE 11.34*  < > 11.13* 8.46* 6.93*  CALCIUM 11.0*  < > 9.0 9.1 9.4  PROT 8.0  --   --   --   --   BILITOT 1.1  --   --   --   --   ALKPHOS 44  --   --   --   --   ALT 17  --   --   --   --   AST 33  --   --   --   --   GLUCOSE 100*  < > 113* 119* 115*  < > = values in this interval not displayed. Lipase: 40 (WNL)  Troponin: 0.19 > 0.18 > 0.16 Lactic acid: 1.70 (WNL) Lactoferrin: Negative FOBT: Positive GI panel: Negative  Imaging/Diagnostic Tests: CT Angio Chest/Abdomen/Pelvis PE W and/or Wo Contrast (06/17/2016) FINDINGS: CTA CHEST FINDINGS Cardiovascular: Cardiomegaly. Moderate pericardial effusion. Aorta and main pulmonary artery are normal in caliber. Limited exam due to contrast bolus. Within the above limitation, no evidence for pulmonary embolus. Mediastinum/Nodes: Stable prominent right hilar lymph node measuring 1.5 cm. No additional mediastinal or hilar adenopathy. The esophagus is normal in appearance. Lungs/Pleura: Central airways are patent. Re- demonstrated extensive bilateral ground-glass pulmonary opacities. Small bilateral pleural effusions. No pneumothorax. Musculoskeletal: Grossly unchanged lucent and sclerotic lesion within the posterior right third rib (image 17; series 7), nonspecific in etiology. No acute  process. Review of the MIP images confirms the above findings.  CT ABDOMEN and PELVIS FINDINGS Hepatobiliary: Liver is normal in size and contour. Gallbladder is unremarkable. Pancreas: Unremarkable Spleen: Unremarkable Adrenals/Urinary Tract: Adrenal glands are normal. Kidneys are atrophic bilaterally. Urinary bladder is decompressed. Stomach/Bowel: Postsurgical changes within the right hemiabdomen. There are a few prominent loops of small bowel within the central abdomen measuring up to 3.5 cm. Remainder of the small bowel is difficult to fully assess given lack of intraperitoneal fat. No definite abnormal bowel wall thickening. No free fluid or free intraperitoneal air. Vascular/Lymphatic: Normal caliber abdominal aorta. No retroperitoneal lymphadenopathy. Reproductive: Prostate is unremarkable. Other: Diffuse anasarca. Musculoskeletal: No aggressive or acute appearing osseous lesions. Review of the MIP images confirms the above findings.  IMPRESSION: No evidence for pulmonary embolus. Extensive patchy ground-glass opacities throughout the lungs bilaterally which may represent edema or atypical infectious process. PCP pneumonia is not excluded. Note somewhat difficult evaluation of the small bowel given lack of oral contrast material and intraperitoneal fat. No definite evidence for bowel obstruction. Mild anasarca. Moderate pericardial effusion.  DG Chest Port 1 View (06/17/2016) FINDINGS: Stable marked cardiomegaly. Interval development of increased pulmonary vascular congestion now with mild diffuse interstitial prominence slightly more confluent in the lung bases. No pleural effusion  or pneumothorax.  IMPRESSION: 1. New pulmonary vascular congestion with interstitial opacities bilaterally but more confluent in the lung bases. Differential considerations in interstitial pulmonary edema and an atypical or viral respiratory infection. 2. Stable massive cardiomegaly.  Menominee Bing, DO 06/22/2016, 7:22 AM PGY-1, [place] Intern pager: [phone], text pages welcome

## 2016-06-22 NOTE — Discharge Instructions (Signed)
You were admitted for shortness of breath with elevated blood pressures. He received several rounds of hemodialysis during her hospitalization with improvement of your shortness of breath. Your burning chest sensation seems to be related to stomach irritation. I will provide you medication to help with this on discharge. We started you on a clonidine patch to help bring her blood pressures down. Please continue this medication once weekly.   Please complete your PCP pneumonia medications. Take your prednisone 20 mg daily until completion on 5/3. Continue clindamycin 300 mg every 8 hours Primaquine 30 mg daily until completed.  He will follow up with your PCP on 5/9 at 3 PM for hospital follow-up. Please arrive 30 minutes early to appointment.

## 2016-06-22 NOTE — Progress Notes (Signed)
Patient ID: Jim Wyatt, male   DOB: [birth year], 36 y.o.   MRN: [id number]  [facility] Progress Note   Assessment/ Plan:   1. Volume overload/pulmonary edema: We discussed continued efforts at low sodium diet and restriction of interdialytic weight gain along with regular attendance and completing entire treatment duration of hemodialysis. 2. ESRD: On a Tuesday/Thursday/Saturday hemodialysis schedule with frequent hemodialysis done during this hospitalization to challenge his dry weight/volume unload. Plan for scheduled hemodialysis today 3. Anemia: Hemoglobin levels borderline low, restart ESA now that blood pressures are better controlled. 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 5. HIV/AIDS: Continue antiretroviral therapy, will further follow-up and management per ID. 6. Hypertension: Intermittent blood pressure elevations noted, started yesterday on doxazosin to augment current blood pressure control-improvement in trend noted. 7. PCP pneumonia: With tapering doses of corticosteroids (which may likely also exacerbate his blood pressures) and antimicrobial therapy with clindamycin/primaquine  Subjective:   Reports continued and significant improvement of his shortness of breath-also noted better blood pressure control overnight with elevation this morning.    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 clindamycin  600 mg Oral Q8H  . cloNIDine  0.1 mg Transdermal Weekly  . dolutegravir  50 mg Oral Daily  . emtricitabine-tenofovir AF  1 tablet Oral Daily  . heparin  5,000 Units Subcutaneous Q8H  . hydrALAZINE  100 mg Oral TID  . levETIRAcetam  250 mg Oral Q T,Th,Sat-1800  . levETIRAcetam  500 mg Oral Daily  . losartan  100 mg Oral Daily  . pantoprazole  40 mg Oral Daily  . predniSONE  20 mg Oral Q breakfast  . primaquine  30 mg Oral Daily  . sevelamer carbonate  1.6 g Oral TID WC  . sucralfate  1 g Oral TID WC & HS  . topiramate  50 mg Oral BID   Elmarie Shiley, MD 06/22/2016, 9:08 AM

## 2016-06-23 ENCOUNTER — Ambulatory Visit (INDEPENDENT_AMBULATORY_CARE_PROVIDER_SITE_OTHER): Payer: BLUE CROSS/BLUE SHIELD | Admitting: Pharmacist Clinician (PhC)/ Clinical Pharmacy Specialist

## 2016-06-23 DIAGNOSIS — B20 Human immunodeficiency virus [HIV] disease: Secondary | ICD-10-CM

## 2016-06-23 LAB — T-HELPER CELL (CD4) - (RCID CLINIC ONLY)
CD4 % Helper T Cell: 5 % — ABNORMAL LOW (ref 33–55)
CD4 T Cell Abs: 60 /uL — ABNORMAL LOW (ref 400–2700)

## 2016-06-23 NOTE — Patient Instructions (Signed)
Come back and see me on 6/1 at AM Continue Tivicay and Descovy

## 2016-06-23 NOTE — Progress Notes (Signed)
HPI: Jim Wyatt is a 36 y.o. male who was recently hospitalized for PCP PNA.   Allergies: Allergies  Allergen Reactions  . Bactrim [Sulfamethoxazole-Trimethoprim] Other (See Comments)    Renal failure  . Reglan [Metoclopramide] Other (See Comments)    Causes ticks/ jerks  . Vancomycin Other (See Comments)    Patient states vancomycin caused kidney injury  . Metoprolol Hives    Pt reports hives  . Amlodipine Palpitations and Other (See Comments)    fatigue  . Augmentin [Amoxicillin-Pot Clavulanate] Hives  . Procardia [Nifedipine] Hives    Pt reports hives, itching, SOB    Vitals:    Past Medical History: Past Medical History:  Diagnosis Date  . Asthma   . CAP (community acquired pneumonia) 10/2013  . Cellulitis 11/2013   LLE  . DVT (deep venous thrombosis) ([place]) 10/2013   BLE  . 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  Went on HD Dec 2016.   [name] Fever   . Headache   . HIV infection ([place]) dx'd 10/2013  . Hypertension   . Leg pain   . Multiple environmental allergies    "trees, dogs, cats"  . Multiple food allergies   . Shingles < 2010    Social History: Social History   Social History  . Marital status: Single    Spouse name: N/A  . Number of children: N/A  . Years of education: N/A   Social History Main Topics  . Smoking status: Never Smoker  . Smokeless tobacco: Never Used  . Alcohol use No     Comment: I stopped drinking along time ago "  . Drug use: No  . Sexual activity: Not on file   Other Topics Concern  . Not on file   Social History Narrative  . No narrative on file    Previous Regimen: ABC/DTG/Prez  Current Regimen: DTG/Descovy  Labs: HIV 1 RNA Quant (copies/mL)  Date Value  02/01/2016 455,000  11/05/2015 125,000  07/06/2015 86,000   CD4 T Cell Abs  (/uL)  Date Value  05/12/2016 4 (L)  03/15/2016 20 (L)  02/01/2016 20 (L)   Hep B S Ab (no units)  Date Value  12/30/2015 Non Reactive   Hepatitis B Surface Ag (no units)  Date Value  03/16/2016 Negative   HCV Ab (no units)  Date Value  11/10/2013 NEGATIVE    CrCl: Estimated Creatinine Clearance: 9.3 mL/min (A) (by C-G formula based on SCr of 10.51 mg/dL (H)).  Lipids:    Component Value Date/Time   CHOL 100 11/15/2013 0820   TRIG 327 (H) 11/15/2013 0820   HDL 8 (L) 11/15/2013 0820   CHOLHDL 12.5 11/15/2013 0820   VLDL 65 (H) 11/15/2013 0820   LDLCALC 27 11/15/2013 0820    Assessment:  was recently admitted to the hospital several times with a multitude of problems including PCP PNA. He is still on primaquin/clinda for the 21 days course. He has a hx of horrible compliance. He claimed to have been taking his ART. He likes this regimen much more (DTG/Descovy). Descovy should be ok based on the recent data at [facility]. He takes them at 2 AM due to his work shift. I showed him both his VL and CD4. Explained to him that this is usually what happens when someone doesn't take their HIV  meds. He said that he is more motivated now in hope of getting a transplant because he is tired of HD. Encourage him that he must get his HIV under control for that chance. He blamed his ESRD on [facility] for causing that with vanc/abx, etc. Explained to him that uncontrolled HIV can lead to this also.   Apparently, he is in the process of moving back into his mom's house in [place] but he will cont to be follow up here. He will come back and see me in a couple of weeks. The last VL that we have was on 4/4 and it was 175k, CD4 20.   Recommendations:  Cont Tivicay and Descovy Finish out Prim/Clinda/Pred HIV VL/CD4 today F/u in 4 wks  Onnie Boer, PharmD, BCPS, AAHIVP, CPP [facility] [place] [facility] 06/23/2016, 10:16 AM

## 2016-06-26 LAB — HIV-1 RNA QUANT-NO REFLEX-BLD
HIV 1 RNA Quant: 149 copies/mL — ABNORMAL HIGH
HIV-1 RNA Quant, Log: 2.17 Log copies/mL — ABNORMAL HIGH

## 2016-07-05 ENCOUNTER — Encounter: Payer: Self-pay | Admitting: Internal Medicine

## 2016-07-05 ENCOUNTER — Ambulatory Visit: Payer: Self-pay | Attending: Internal Medicine | Admitting: Internal Medicine

## 2016-07-05 VITALS — BP 175/92 | HR 122 | Temp 98.1°F | Resp 18 | Ht 67.0 in | Wt 161.0 lb

## 2016-07-05 DIAGNOSIS — N186 End stage renal disease: Secondary | ICD-10-CM | POA: Insufficient documentation

## 2016-07-05 DIAGNOSIS — B20 Human immunodeficiency virus [HIV] disease: Secondary | ICD-10-CM | POA: Insufficient documentation

## 2016-07-05 DIAGNOSIS — R569 Unspecified convulsions: Secondary | ICD-10-CM | POA: Insufficient documentation

## 2016-07-05 DIAGNOSIS — Z882 Allergy status to sulfonamides status: Secondary | ICD-10-CM | POA: Insufficient documentation

## 2016-07-05 DIAGNOSIS — G4733 Obstructive sleep apnea (adult) (pediatric): Secondary | ICD-10-CM | POA: Insufficient documentation

## 2016-07-05 DIAGNOSIS — M792 Neuralgia and neuritis, unspecified: Secondary | ICD-10-CM | POA: Insufficient documentation

## 2016-07-05 DIAGNOSIS — Z992 Dependence on renal dialysis: Secondary | ICD-10-CM | POA: Insufficient documentation

## 2016-07-05 DIAGNOSIS — Z79899 Other long term (current) drug therapy: Secondary | ICD-10-CM | POA: Insufficient documentation

## 2016-07-05 DIAGNOSIS — Z09 Encounter for follow-up examination after completed treatment for conditions other than malignant neoplasm: Secondary | ICD-10-CM

## 2016-07-05 DIAGNOSIS — Z881 Allergy status to other antibiotic agents status: Secondary | ICD-10-CM | POA: Insufficient documentation

## 2016-07-05 DIAGNOSIS — Z880 Allergy status to penicillin: Secondary | ICD-10-CM | POA: Insufficient documentation

## 2016-07-05 DIAGNOSIS — I120 Hypertensive chronic kidney disease with stage 5 chronic kidney disease or end stage renal disease: Secondary | ICD-10-CM | POA: Insufficient documentation

## 2016-07-05 DIAGNOSIS — L299 Pruritus, unspecified: Secondary | ICD-10-CM | POA: Insufficient documentation

## 2016-07-05 MED ORDER — GABAPENTIN 100 MG PO CAPS: 100.0000 mg | ORAL_CAPSULE | Freq: Three times a day (TID) | ORAL | 3 refills | Status: DC

## 2016-07-05 MED ORDER — HYDROXYZINE HCL 25 MG PO TABS: 25.0000 mg | ORAL_TABLET | Freq: Three times a day (TID) | ORAL | 3 refills | Status: DC | PRN

## 2016-07-05 NOTE — Progress Notes (Signed)
Jim Wyatt, is a 36 y.o. male  TML:[id number]  KCL:[id number]  DOB - November 13, 1980  No chief complaint on file.      Subjective:   Jim Wyatt is a 36 y.o. 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 Patient was recently admitted to the hospital for chest pain, shortness of breath, and abdominal pain after flying back from [place]. Pt presented to the [facility] ED and began experiencing nonproductive cough. Patient stated last dialysis session was 2 days prior. Upon arrival, patient tachycardic with hypertensive urgency. CXR pulmonary vascular congestion. 2 TA negative for PE however consistent with patchy groundglass opacities bilaterally throughout with moderate pericardial effusion without tampon. CT of abdomen/pelvis unremarkable. Patient was taken directly to HD for concern of fluid overload. Nephrology follow patient's care during admission. Chest pain resolved with Tums and Carafate. Troponin negative with unremarkable EKG findings. This was thought to be secondary to GI irritation. Symptoms resolved after several rounds of HD over the course of his hospital stay. Patient was discharged home to follow up with [place] today. He continues to suffer from neuropathic pain especially after each dialysis session. He is due for another dialysis tomorrow. He also still has generalized itching.   Problem  Neuropathic Pain  Pruritus    ALLERGIES: Allergies  Allergen Reactions  . Bactrim [Sulfamethoxazole-Trimethoprim] Other (See Comments)    Renal failure  . Reglan [Metoclopramide] Other (See Comments)    Causes ticks/ jerks  . Vancomycin Other (See Comments)    Patient states vancomycin caused kidney injury  . Metoprolol Hives    Pt reports hives  . Amlodipine Palpitations and Other (See Comments)    fatigue  . Augmentin [Amoxicillin-Pot  Clavulanate] Hives  . Procardia [Nifedipine] Hives    Pt reports hives, itching, SOB    PAST MEDICAL HISTORY: Past Medical History:  Diagnosis Date  . Asthma   . CAP (community acquired pneumonia) 10/2013  . Cellulitis 11/2013   LLE  . DVT (deep venous thrombosis) ([place]) 10/2013   BLE  . 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  Went on HD Dec 2016.   [name] Fever   . Headache   . HIV infection ([place]) dx'd 10/2013  . Hypertension   . Leg pain   . Multiple environmental allergies    "trees, dogs, cats"  . Multiple food allergies   . Shingles < 2010    MEDICATIONS AT HOME: Prior to Admission medications   Medication Sig Start Date End Date Taking? Authorizing Provider  acetaminophen (TYLENOL) 500 MG tablet Take 1,000 mg by mouth every 6 (six) hours as needed (pain).    [provider]  albuterol (PROVENTIL HFA;VENTOLIN HFA) 108 (90 Base) MCG/ACT inhaler Inhale 2 puffs into the lungs every 6 (six) hours as needed for wheezing or shortness of breath. 06/22/16   Smiley Houseman, MD  clindamycin (CLEOCIN) 300 MG capsule Take 2 capsules (600 mg total) by mouth every 8 (eight) hours. 06/22/16 07/11/16  [place] Bing, DO  cloNIDine (CATAPRES - DOSED IN MG/24 HR) 0.1 mg/24hr patch Place 1 patch (0.1 mg total) onto the skin once a week. 06/28/16   [place] Bing, DO  dolutegravir (TIVICAY) 50 MG tablet Take 1 tablet (  50 mg total) by mouth daily. 06/02/16   Truman Hayward, MD  emtricitabine-tenofovir AF (DESCOVY) 200-25 MG tablet Take 1 tablet by mouth daily. 06/02/16   Truman Hayward, MD  gabapentin (NEURONTIN) 100 MG capsule Take 1 capsule (100 mg total) by mouth 3 (three) times daily. 07/05/16   [name], MD  hydrALAZINE (APRESOLINE) 50 MG tablet Take 2 tablets (100 mg total) by mouth 3  (three) times daily. 06/22/16   Smiley Houseman, MD  hydrOXYzine (ATARAX/VISTARIL) 25 MG tablet Take 1 tablet (25 mg total) by mouth 3 (three) times daily as needed for itching. 07/05/16   [name], MD  levETIRAcetam (KEPPRA) 250 MG tablet Take 1 tablet (250 mg) by mouth on Tuesday, Thursday, Saturday at 7pm Patient taking differently: Take 500-750 mg by mouth See admin instructions. 500mg  daily takes extra 250mg  on dialysis days Wallie Char, Sat 03/13/16   Cameron Sprang, MD  losartan (COZAAR) 100 MG tablet Take 100 mg by mouth at bedtime.    [provider]  pantoprazole (PROTONIX) 40 MG tablet Take 1 tablet (40 mg total) by mouth daily. 06/22/16   [place] Bing, DO  predniSONE (DELTASONE) 20 MG tablet Take 2 tab before bedtime on 4/6, then 2 tabs twice daily for 1 day, then 2 tabs daily for 5 days, then 1 tab daily for 11 days 06/02/16   [place] Bing, DO  primaquine 26.3 MG tablet Take 2 tablets (30 mg total) by mouth daily. 06/22/16 07/10/16  [place] Bing, DO  sucralfate (CARAFATE) 1 GM/10ML suspension Take 10 mLs (1 g total) by mouth 4 (four) times daily -  with meals and at bedtime. 06/22/16   [place] Bing, DO  topiramate (TOPAMAX) 50 MG tablet Take 1 tablet (50 mg total) by mouth 2 (two) times daily. 06/22/16   Smiley Houseman, MD  venlafaxine XR (EFFEXOR-XR) 150 MG 24 hr capsule  04/18/16   [provider]    Objective:   Vitals:   07/05/16 1553  BP: (!) 175/92  Pulse: (!) 122  Resp: 18  Temp: 98.1 F (36.7 C)  TempSrc: Oral  SpO2: 92%  Weight: 161 lb (73 kg)  Height: 5\' 7"  (1.702 m)   Exam General appearance : Awake, alert, not in any distress. Speech Clear. Chronically ill-looking looking HEENT: Atraumatic and Normocephalic, pupils equally reactive to light and accomodation Neck: Supple, no JVD. No cervical lymphadenopathy.  Chest: Good air entry bilaterally, no added sounds  CVS: S1 S2 regular, ++ murmurs.  Abdomen: Bowel  sounds present, Non tender and not distended with no gaurding, rigidity or rebound. Extremities: Multiple Fistilae bulging on upper limbs L>>R. B/L Lower Ext shows no edema, both legs are warm to touch Neurology: Awake alert, and oriented X 3, CN II-XII intact, Non focal Skin: No Rash  Data Review Lab Results  Component Value Date   HGBA1C <4.2 (L) 05/12/2016    Assessment & Plan   1. Neuropathic pain  - gabapentin (NEURONTIN) 100 MG capsule; Take 1 capsule (100 mg total) by mouth 3 (three) times daily.  Dispense: 90 capsule; Refill: 3  2. Pruritus  - hydrOXYzine (ATARAX/VISTARIL) 25 MG tablet; Take 1 tablet (25 mg total) by mouth 3 (three) times daily as needed for itching.  Dispense: 90 tablet; Refill: 3  3. Hospital discharge follow-up - Patient is hemodynamically stable - Continue current treatment - Follow up with specialists as scheduled  4. ESRD on dialysis [facility]) -  Due for Dialysis tomorrow - Continue follow up with Nephrologist  5. OSA (obstructive sleep apnea)  Patient prescribed CPAP machine today   Patient have been counseled extensively about nutrition and exercise. Other issues discussed during this visit include: low cholesterol diet, weight control and daily exercise, importance of adherence with medications and regular follow-up. We also discussed long term complications of uncontrolled hypertension.   Return in about 3 months (around 10/05/2016) for CKD/ESRD, Follow up HTN, Follow up Pain and comorbidities.  The patient was given clear instructions to go to ER or return to medical center if symptoms don't improve, worsen or new problems develop. The patient verbalized understanding. The patient was told to call to get lab results if they haven't heard anything in the next week.   This note has been created with Surveyor, quantity. Any transcriptional errors are unintentional.    [name], MD, [place], [place],  [place], [place] and [place], [place]   07/05/2016, 4:24 PM

## 2016-07-05 NOTE — Patient Instructions (Signed)
Neuropathic Pain Neuropathic pain is pain caused by damage to the nerves that are responsible for certain sensations in your body (sensory nerves). The pain can be caused by damage to:  The sensory nerves that send signals to your spinal cord and brain (peripheral nervous system).  The sensory nerves in your brain or spinal cord (central nervous system). Neuropathic pain can make you more sensitive to pain. What would be a minor sensation for most people may feel very painful if you have neuropathic pain. This is usually a long-term condition that can be difficult to treat. The type of pain can differ from person to person. It may start suddenly (acute), or it may develop slowly and last for a long time (chronic). Neuropathic pain may come and go as damaged nerves heal or may stay at the same level for years. It often causes emotional distress, loss of sleep, and a lower quality of life. What are the causes? The most common cause of damage to a sensory nerve is diabetes. Many other diseases and conditions can also cause neuropathic pain. Causes of neuropathic pain can be classified as:  Toxic. Many drugs and chemicals can cause toxic damage. The most common cause of toxic neuropathic pain is damage from drug treatment for cancer (chemotherapy).  Metabolic. This type of pain can happen when a disease causes imbalances that damage nerves. Diabetes is the most common of these diseases. Vitamin B deficiency caused by long-term alcohol abuse is another common cause.  Traumatic. Any injury that cuts, crushes, or stretches a nerve can cause damage and pain. A common example is feeling pain after losing an arm or leg (phantom limb pain).  Compression-related. If a sensory nerve gets trapped or compressed for a long period of time, the blood supply to the nerve can be cut off.  Vascular. Many blood vessel diseases can cause neuropathic pain by decreasing blood supply and oxygen to nerves.  Autoimmune.  This type of pain results from diseases in which the body's defense system mistakenly attacks sensory nerves. Examples of autoimmune diseases that can cause neuropathic pain include lupus and multiple sclerosis.  Infectious. Many types of viral infections can damage sensory nerves and cause pain. Shingles infection is a common cause of this type of pain.  Inherited. Neuropathic pain can be a symptom of many diseases that are passed down through families (genetic). What are the signs or symptoms? The main symptom is pain. Neuropathic pain is often described as:  Burning.  Shock-like.  Stinging.  Hot or cold.  Itching. How is this diagnosed? No single test can diagnose neuropathic pain. Your health care provider will do a physical exam and ask you about your pain. You may use a pain scale to describe how bad your pain is. You may also have tests to see if you have a high sensitivity to pain and to help find the cause and location of any sensory nerve damage. These tests may include:  Imaging studies, such as:  X-rays.  CT scan.  MRI.  Nerve conduction studies to test how well nerve signals travel through your sensory nerves (electrodiagnostic testing).  Stimulating your sensory nerves through electrodes on your skin and measuring the response in your spinal cord and brain (somatosensory evoked potentials). How is this treated? Treatment for neuropathic pain may change over time. You may need to try different treatment options or a combination of treatments. Some options include:  Over-the-counter pain relievers.  Prescription medicines. Some medicines used to treat other  conditions may also help neuropathic pain. These include medicines to:  Control seizures (anticonvulsants).  Relieve depression (antidepressants).  Prescription-strength pain relievers (narcotics). These are usually used when other pain relievers do not help.  Transcutaneous nerve stimulation (TENS). This  uses electrical currents to block painful nerve signals. The treatment is painless.  Topical and local anesthetics. These are medicines that numb the nerves. They can be injected as a nerve block or applied to the skin.  Alternative treatments, such as:  Acupuncture.  Meditation.  Massage.  Physical therapy.  Pain management programs.  Counseling. Follow these instructions at home:  Learn as much as you can about your condition.  Take medicines only as directed by your health care provider.  Work closely with all your health care providers to find what works best for you.  Have a good support system at home.  Consider joining a chronic pain support group. Contact a health care provider if:  Your pain treatments are not helping.  You are having side effects from your medicines.  You are struggling with fatigue, mood changes, depression, or anxiety. This information is not intended to replace advice given to you by your health care provider. Make sure you discuss any questions you have with your health care provider. Document Released: 11/11/2003 Document Revised: 09/03/2015 Document Reviewed: 07/24/2013 Elsevier Interactive Patient Education  2017 [employer]. Pruritus Pruritus is an itching feeling. There are many different conditions and factors that can make your skin itchy. Dry skin is one of the most common causes of itching. Most cases of itching do not require medical attention. Itchy skin can turn into a rash. Follow these instructions at home: Watch your pruritus for any changes. Take these steps to help with your condition: Skin Care   Moisturize your skin as needed. A moisturizer that contains petroleum jelly is best for keeping moisture in your skin.  Take or apply medicines only as directed by your health care provider. This may include:  Corticosteroid cream.  Anti-itch lotions.  Oral anti-histamines.  Apply cool compresses to the affected  areas.  Try taking a bath with:  Epsom salts. Follow the instructions on the packaging. You can get these at your local pharmacy or grocery store.  Baking soda. Pour a small amount into the bath as directed by your health care provider.  Colloidal oatmeal. Follow the instructions on the packaging. You can get this at your local pharmacy or grocery store.  Try applying baking soda paste to your skin. Stir water into baking soda until it reaches a paste-like consistency.  Do not scratch your skin.  Avoid hot showers or baths, which can make itching worse. A cold shower may help with itching as long as you use a moisturizer after.  Avoid scented soaps, detergents, and perfumes. Use gentle soaps, detergents, perfumes, and other cosmetic products. General instructions   Avoid wearing tight clothes.  Keep a journal to help track what causes your itch. Write down:  What you eat.  What cosmetic products you use.  What you drink.  What you wear. This includes jewelry.  Use a humidifier. This keeps the air moist, which helps to prevent dry skin. Contact a health care provider if:  The itching does not go away after several days.  You sweat at night.  You have weight loss.  You are unusually thirsty.  You urinate more than normal.  You are more tired than normal.  You have abdominal pain.  Your skin tingles.  You  feel weak.  Your skin or the whites of your eyes look yellow (jaundice).  Your skin feels numb. This information is not intended to replace advice given to you by your health care provider. Make sure you discuss any questions you have with your health care provider. Document Released: 10/26/2010 Document Revised: 07/22/2015 Document Reviewed: 02/09/2014 Elsevier Interactive Patient Education  2017 [employer].

## 2016-07-06 ENCOUNTER — Telehealth: Payer: Self-pay | Admitting: Internal Medicine

## 2016-07-06 NOTE — Telephone Encounter (Signed)
Patient's mother called the office asking to speak with nurse regarding the paperwork that patient needs provider to fill out. Please follow up.  Thank you.

## 2016-07-10 ENCOUNTER — Ambulatory Visit (INDEPENDENT_AMBULATORY_CARE_PROVIDER_SITE_OTHER): Payer: BLUE CROSS/BLUE SHIELD | Admitting: Infectious Disease

## 2016-07-10 ENCOUNTER — Encounter: Payer: Self-pay | Admitting: Infectious Disease

## 2016-07-10 VITALS — BP 184/128 | HR 130 | Temp 99.4°F | Ht 67.0 in | Wt 162.0 lb

## 2016-07-10 DIAGNOSIS — N08 Glomerular disorders in diseases classified elsewhere: Secondary | ICD-10-CM

## 2016-07-10 DIAGNOSIS — N186 End stage renal disease: Secondary | ICD-10-CM

## 2016-07-10 DIAGNOSIS — Z23 Encounter for immunization: Secondary | ICD-10-CM

## 2016-07-10 DIAGNOSIS — Z992 Dependence on renal dialysis: Secondary | ICD-10-CM

## 2016-07-10 DIAGNOSIS — B20 Human immunodeficiency virus [HIV] disease: Secondary | ICD-10-CM

## 2016-07-10 NOTE — Progress Notes (Signed)
HPI: Jim Wyatt is a 36 y.o. male who presents to the [facility] clinic today for follow-up with Dr. Tommy Medal.   Allergies: Allergies  Allergen Reactions  . Bactrim [Sulfamethoxazole-Trimethoprim] Other (See Comments)    Renal failure  . Reglan [Metoclopramide] Other (See Comments)    Causes ticks/ jerks  . Vancomycin Other (See Comments)    Patient states vancomycin caused kidney injury  . Metoprolol Hives    Pt reports hives  . Amlodipine Palpitations and Other (See Comments)    fatigue  . Augmentin [Amoxicillin-Pot Clavulanate] Hives  . Procardia [Nifedipine] Hives    Pt reports hives, itching, SOB    Past Medical History: Past Medical History:  Diagnosis Date  . Asthma   . CAP (community acquired pneumonia) 10/2013  . Cellulitis 11/2013   LLE  . DVT (deep venous thrombosis) ([place]) 10/2013   BLE  . 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  Went on HD Dec 2016.   [name] Fever   . Headache   . HIV infection ([place]) dx'd 10/2013  . Hypertension   . Leg pain   . Multiple environmental allergies    "trees, dogs, cats"  . Multiple food allergies   . Shingles < 2010    Social History: Social History   Social History  . Marital status: Single    Spouse name: N/A  . Number of children: N/A  . Years of education: N/A   Social History Main Topics  . Smoking status: Never Smoker  . Smokeless tobacco: Never Used  . Alcohol use No     Comment: I stopped drinking along time ago "  . Drug use: No  . Sexual activity: Not Asked   Other Topics Concern  . None   Social History Narrative  . None    Current Regimen: Tivicay + Descovy  Labs: HIV 1 RNA Quant (copies/mL)  Date Value  06/23/2016 149 (H)  02/01/2016 455,000  11/05/2015 125,000   CD4 T Cell Abs (/uL)  Date Value  06/23/2016 60 (L)   05/12/2016 4 (L)  03/15/2016 20 (L)   Hep B S Ab (no units)  Date Value  12/30/2015 Non Reactive   Hepatitis B Surface Ag (no units)  Date Value  03/16/2016 Negative   HCV Ab (no units)  Date Value  11/10/2013 NEGATIVE    CrCl: Estimated Creatinine Clearance: 9.3 mL/min (A) (by C-G formula based on SCr of 10.51 mg/dL (H)).  Lipids:    Component Value Date/Time   CHOL 100 11/15/2013 0820   TRIG 327 (H) 11/15/2013 0820   HDL 8 (L) 11/15/2013 0820   CHOLHDL 12.5 11/15/2013 0820   VLDL 65 (H) 11/15/2013 0820   LDLCALC 27 11/15/2013 0820    Assessment: Jim Wyatt is here today for HIV follow-up with Dr. Tommy Medal. He has had many problems with compliance in the past and was recently admitted with PCP pneumonia. He tells me today that he has been consistently taking his Tivicay + Descovy and it is working well for him.  He takes it every morning now at 7am he says.  He has HD on TThS and is hoping for a kidney transplant.  He states he hasn't missed any doses since he was discharged from the hospital.  He will follow back up with [place] in 2 weeks.  Plans: - Continue Tivicay + Descovy - F/u with pharmacy 6/1 at [facility]. Geniyah Eischeid, PharmD, [place] [facility] 07/10/2016, 9:53 AM

## 2016-07-10 NOTE — Addendum Note (Signed)
Addended by: Roma Kayser on: 07/10/2016 01:41 PM   Modules accepted: Orders

## 2016-07-10 NOTE — Patient Instructions (Signed)
Make appt with [facility] or [facility] next 2 weeks  Appt with Me in [place]

## 2016-07-10 NOTE — Progress Notes (Signed)
Subjective:    Patient ID: Jim Wyatt, male    DOB: 1981-02-24, 36 y.o.   MRN: [id number]  HPI  36 y.o. 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  He has been treated with > 21 days of clinda, primaquin and prednisone (was still taking prednisone and clindamycin with him).  We simplified his prior regimen to Tivicay and Descovy and he dropped his VL nicely from over 150K to 149 copies, CD4 from 20 to 60.  Lab Results  Component Value Date   HIV1RNAQUANT 149 (H) 06/23/2016   HIV1RNAQUANT 455,000 02/01/2016   HIV1RNAQUANT 125,000 11/05/2015   Lab Results  Component Value Date   CD4TABS 60 (L) 06/23/2016   CD4TABS 4 (L) 05/12/2016   CD4TABS 20 (L) 03/15/2016   He is eager to get kidney transplant (and Mom will donate her kidney if he qualifies)  Past Medical History:  Diagnosis Date  . Asthma   . CAP (community acquired pneumonia) 10/2013  . Cellulitis 11/2013   LLE  . DVT (deep venous thrombosis) ([place]) 10/2013   BLE  . ESRD on hemodialysis ([place]) 01/2015   AKI with nephrotic syndrome (16gm proteinuria at time of biopsy, from collapsing FSG). 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  Went on HD Dec 2016.   [name] Fever   . Headache   . HIV infection ([place]) dx'd 10/2013  . Hypertension   . Leg pain   . Multiple environmental allergies    "trees, dogs, cats"  . Multiple food allergies   . Shingles < 2010    Past Surgical History:  Procedure Laterality Date  . AV FISTULA PLACEMENT Left 03/03/2015   Procedure: Creation of Left Arm RADIOCEPHALIC ARTERIOVENOUS FISTULA ;  Surgeon: Mal Misty, MD;  Location: [facility];  Service: Vascular;  Laterality: Left;  . COLONOSCOPY N/A 03/18/2016   Procedure: COLONOSCOPY;  Surgeon: Manus Gunning, MD;  Location: [facility];  Service: Gastroenterology;  Laterality: N/A;  .  FEMUR FRACTURE SURGERY Right 09/2001  . FRACTURE SURGERY    . I&D EXTREMITY Right 12/18/2014   Procedure: IRRIGATION AND DEBRIDEMENT RIGHT LOWER EXTREMITY;  Surgeon: Ralene Ok, MD;  Location: [facility];  Service: General;  Laterality: Right;  . INSERTION OF DIALYSIS CATHETER  2016  . LAPAROTOMY N/A 12/28/2015   Procedure: EXPLORATORY LAPAROTOMY BOWEL RESECTION ILEOCECECTOMY;  Surgeon: Clovis Riley, MD;  Location: [facility];  Service: General;  Laterality: N/A;    Family History  Problem Relation Age of Onset  . Hypertension Mother   . Diabetes Maternal Grandmother   . Kidney disease Maternal Grandmother       Social History   Social History  . Marital status: Single    Spouse name: N/A  . Number of children: N/A  . Years of education: N/A   Social History Main Topics  . Smoking status: Never Smoker  . Smokeless tobacco: Never Used  . Alcohol use No     Comment: I stopped drinking along time ago "  . Drug use: No  . Sexual activity: Not Asked   Other Topics Concern  . None   Social History Narrative  . None    Allergies  Allergen Reactions  . Bactrim [Sulfamethoxazole-Trimethoprim] Other (See Comments)    Renal failure  . Reglan [Metoclopramide] Other (See Comments)  Causes ticks/ jerks  . Vancomycin Other (See Comments)    Patient states vancomycin caused kidney injury  . Metoprolol Hives    Pt reports hives  . Amlodipine Palpitations and Other (See Comments)    fatigue  . Augmentin [Amoxicillin-Pot Clavulanate] Hives  . Procardia [Nifedipine] Hives    Pt reports hives, itching, SOB     Current Outpatient Prescriptions:  .  acetaminophen (TYLENOL) 500 MG tablet, Take 1,000 mg by mouth every 6 (six) hours as needed (pain)., Disp: , Rfl:  .  albuterol (PROVENTIL HFA;VENTOLIN HFA) 108 (90 Base) MCG/ACT inhaler, Inhale 2 puffs into the lungs every 6 (six) hours as needed for wheezing or shortness of breath., Disp: 1 Inhaler, Rfl: 0 .  cloNIDine (CATAPRES -  DOSED IN MG/24 HR) 0.1 mg/24hr patch, Place 1 patch (0.1 mg total) onto the skin once a week., Disp: 4 patch, Rfl: 0 .  dolutegravir (TIVICAY) 50 MG tablet, Take 1 tablet (50 mg total) by mouth daily., Disp: 30 tablet, Rfl: 6 .  emtricitabine-tenofovir AF (DESCOVY) 200-25 MG tablet, Take 1 tablet by mouth daily., Disp: 30 tablet, Rfl: 6 .  gabapentin (NEURONTIN) 100 MG capsule, Take 1 capsule (100 mg total) by mouth 3 (three) times daily., Disp: 90 capsule, Rfl: 3 .  hydrALAZINE (APRESOLINE) 50 MG tablet, Take 2 tablets (100 mg total) by mouth 3 (three) times daily., Disp: 180 tablet, Rfl: 0 .  hydrOXYzine (ATARAX/VISTARIL) 25 MG tablet, Take 1 tablet (25 mg total) by mouth 3 (three) times daily as needed for itching., Disp: 90 tablet, Rfl: 3 .  levETIRAcetam (KEPPRA) 250 MG tablet, Take 1 tablet (250 mg) by mouth on Tuesday, Thursday, Saturday at 7pm (Patient taking differently: Take 500-750 mg by mouth See admin instructions. 500mg  daily takes extra 250mg  on dialysis days Tu, Thurs, Sat), Disp: 60 tablet, Rfl: 11 .  losartan (COZAAR) 100 MG tablet, Take 100 mg by mouth at bedtime., Disp: , Rfl:  .  topiramate (TOPAMAX) 50 MG tablet, Take 1 tablet (50 mg total) by mouth 2 (two) times daily., Disp: 60 tablet, Rfl: 0 .  pantoprazole (PROTONIX) 40 MG tablet, Take 1 tablet (40 mg total) by mouth daily., Disp: 30 tablet, Rfl: 0 .  sucralfate (CARAFATE) 1 GM/10ML suspension, Take 10 mLs (1 g total) by mouth 4 (four) times daily -  with meals and at bedtime. (Patient not taking: Reported on 07/10/2016), Disp: 420 mL, Rfl: 0 .  venlafaxine XR (EFFEXOR-XR) 150 MG 24 hr capsule, , Disp: , Rfl: 1   Review of Systems  Constitutional: Negative for chills and fever.  HENT: Negative for congestion and sore throat.   Eyes: Negative for photophobia.  Respiratory: Negative for cough, shortness of breath and wheezing.   Cardiovascular: Negative for chest pain, palpitations and leg swelling.  Gastrointestinal:  Negative for abdominal pain, blood in stool, constipation, diarrhea, nausea and vomiting.  Genitourinary: Negative for dysuria, flank pain and hematuria.  Musculoskeletal: Negative for back pain and myalgias.  Skin: Positive for rash.  Neurological: Negative for dizziness, weakness and headaches.  Hematological: Does not bruise/bleed easily.  Psychiatric/Behavioral: Negative for suicidal ideas.       Objective:   Physical Exam  Constitutional: He is oriented to person, place, and time. No distress.  HENT:  Head: Normocephalic and atraumatic.  Mouth/Throat: No oropharyngeal exudate.  Eyes: Conjunctivae and EOM are normal. No scleral icterus.  Neck: Normal range of motion. Neck supple.  Cardiovascular: Normal rate and regular rhythm.   Pulmonary/Chest: Effort  normal. No respiratory distress. He has no wheezes.  Abdominal: He exhibits no distension.  Musculoskeletal: He exhibits no edema or tenderness.  Neurological: He is alert and oriented to person, place, and time. He exhibits normal muscle tone. Coordination normal.  Skin: Skin is warm and dry. No rash noted. He is not diaphoretic. No erythema. No pallor.  Psychiatric: He has a normal mood and affect. His behavior is normal. Judgment and thought content normal.          Assessment & Plan:   HIV/AIDS: continue Tivicay, Descovy, Dapsone for PCP prevention. Give  Meningococcal vaccine. Get labs today and see Pharmacy ina  Few weeks and me shortly thereafter. Will try to keep seeing him every 2-4 weeks with ID pharmacy help  PCP: finished treatment, change to dapsone   ESRD on HD: he wants transplant and was going to [facility] to [facility] but not sure they are yet transplanting HIV +   I spent greater than 40 minutes with the patient including greater than 50% of time in face to face counsel of the patient re his HIV, prasiing him for his adherence, re his PCP ESRD and in coordination of his care.

## 2016-07-11 LAB — T-HELPER CELL (CD4) - (RCID CLINIC ONLY)
CD4 % Helper T Cell: 6 % — ABNORMAL LOW (ref 33–55)
CD4 T Cell Abs: 50 /uL — ABNORMAL LOW (ref 400–2700)

## 2016-07-14 LAB — HIV RNA, RTPCR W/R GT (RTI, PI,INT)
HIV-1 RNA, QN PCR: 1.93 Log copies/mL — ABNORMAL HIGH
HIV-1 RNA, QN PCR: 85 copies/mL — ABNORMAL HIGH

## 2016-07-19 ENCOUNTER — Encounter: Payer: Self-pay | Admitting: Internal Medicine

## 2016-07-19 ENCOUNTER — Ambulatory Visit: Payer: Self-pay | Attending: Internal Medicine | Admitting: Internal Medicine

## 2016-07-19 VITALS — BP 153/87 | HR 124 | Temp 98.3°F | Resp 18 | Ht 66.0 in | Wt 177.0 lb

## 2016-07-19 DIAGNOSIS — B20 Human immunodeficiency virus [HIV] disease: Secondary | ICD-10-CM | POA: Insufficient documentation

## 2016-07-19 DIAGNOSIS — Z992 Dependence on renal dialysis: Secondary | ICD-10-CM | POA: Insufficient documentation

## 2016-07-19 DIAGNOSIS — L0390 Cellulitis, unspecified: Secondary | ICD-10-CM | POA: Insufficient documentation

## 2016-07-19 DIAGNOSIS — I10 Essential (primary) hypertension: Secondary | ICD-10-CM | POA: Insufficient documentation

## 2016-07-19 DIAGNOSIS — N186 End stage renal disease: Secondary | ICD-10-CM | POA: Insufficient documentation

## 2016-07-19 DIAGNOSIS — L708 Other acne: Secondary | ICD-10-CM | POA: Insufficient documentation

## 2016-07-19 DIAGNOSIS — Z86718 Personal history of other venous thrombosis and embolism: Secondary | ICD-10-CM | POA: Insufficient documentation

## 2016-07-19 MED ORDER — TRIAMCINOLONE ACETONIDE 0.5 % EX OINT: 1.0000 "application " | TOPICAL_OINTMENT | Freq: Two times a day (BID) | CUTANEOUS | 2 refills | Status: DC

## 2016-07-19 NOTE — Progress Notes (Signed)
Jim Wyatt, is a 36 y.o. male  WUJ:[id number]  [id number]:[id number]  DOB - 1981-01-29  Chief Complaint  Patient presents with  . Hypertension       Subjective:   Jim Wyatt is a 36 y.o. 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 BP remains uncontrolled but better than previous readings. He claims adherence with his medications, follows up regularly with multiple specialists including nephrologist and ID. Patient has no new complaint but concerned about the "bumps" on his face and scalp which all started like pimples but getting bigger and darker. He is due for dialysis tomorrow. He still works third shift. Patient has No headache, No chest pain, No abdominal pain - No Nausea, No new weakness tingling or numbness, No Cough - SOB. He continues to suffer from neuropathic pain especially after each dialysis session.   Problem  Acne-Like Skin Bumps  Human immunodeficiency virus (HIV) disease (H[facility])    ALLERGIES: Allergies  Allergen Reactions  . Bactrim [Sulfamethoxazole-Trimethoprim] Other (See Comments)    Renal failure  . Reglan [Metoclopramide] Other (See Comments)    Causes ticks/ jerks  . Vancomycin Other (See Comments)    Patient states vancomycin caused kidney injury  . Metoprolol Hives    Pt reports hives  . Amlodipine Palpitations and Other (See Comments)    fatigue  . Augmentin [Amoxicillin-Pot Clavulanate] Hives  . Procardia [Nifedipine] Hives    Pt reports hives, itching, SOB    PAST MEDICAL HISTORY: Past Medical History:  Diagnosis Date  . Asthma   . CAP (community acquired pneumonia) 10/2013  . Cellulitis 11/2013   LLE  . DVT (deep venous thrombosis) ([place]) 10/2013   BLE  . ESRD on hemodialysis ([place]) 01/2015   AKI with nephrotic syndrome (16gm proteinuria at time of biopsy, from collapsing FSG). Renal Bx (11/17/13) with  collapsing FSGS (HIVAN) + ATN (suspected vanc toxic at time of biopsy).  Went on HD Dec 2016.   [name] Fever   . Headache   . HIV infection ([place]) dx'd 10/2013  . Hypertension   . Leg pain   . Multiple environmental allergies    "trees, dogs, cats"  . Multiple food allergies   . Shingles < 2010    MEDICATIONS AT HOME: Prior to Admission medications   Medication Sig Start Date End Date Taking? Authorizing Provider  acetaminophen (TYLENOL) 500 MG tablet Take 1,000 mg by mouth every 6 (six) hours as needed (pain).   Yes [provider]  albuterol (PROVENTIL HFA;VENTOLIN HFA) 108 (90 Base) MCG/ACT inhaler Inhale 2 puffs into the lungs every 6 (six) hours as needed for wheezing or shortness of breath. 06/22/16  Yes Smiley Houseman, MD  cloNIDine (CATAPRES - DOSED IN MG/24 HR) 0.1 mg/24hr patch Place 1 patch (0.1 mg total) onto the skin once a week. 06/28/16  Yes [place] Bing, DO  dolutegravir (TIVICAY) 50 MG tablet Take 1 tablet (50 mg total) by mouth daily. 06/02/16  Yes Tommy Medal, Lavell Islam, MD  emtricitabine-tenofovir AF (DESCOVY) 200-25 MG tablet Take 1 tablet by mouth daily. 06/02/16  Yes Tommy Medal, Lavell Islam, MD  gabapentin (NEURONTIN) 100 MG capsule Take 1 capsule (100 mg total) by mouth 3 (three) times daily. 07/05/16  Yes [name], MD  hydrALAZINE (APRESOLINE) 50 MG tablet Take 2 tablets (100 mg total) by mouth 3 (three) times  daily. 06/22/16  Yes Smiley Houseman, MD  hydrOXYzine (ATARAX/VISTARIL) 25 MG tablet Take 1 tablet (25 mg total) by mouth 3 (three) times daily as needed for itching. 07/05/16  Yes [name], MD  levETIRAcetam (KEPPRA) 250 MG tablet Take 1 tablet (250 mg) by mouth on Tuesday, Thursday, Saturday at 7pm Patient taking differently: Take 500-750 mg by mouth See admin instructions. 500mg  daily takes extra 250mg  on dialysis days Wallie Char, Sat 03/13/16  Yes Cameron Sprang, MD  losartan (COZAAR) 100 MG tablet Take 100 mg by mouth at bedtime.    Yes [provider]  pantoprazole (PROTONIX) 40 MG tablet Take 1 tablet (40 mg total) by mouth daily. 06/22/16  Yes Byron Bing, DO  sucralfate (CARAFATE) 1 GM/10ML suspension Take 10 mLs (1 g total) by mouth 4 (four) times daily -  with meals and at bedtime. 06/22/16  Yes [place] Bing, DO  topiramate (TOPAMAX) 50 MG tablet Take 1 tablet (50 mg total) by mouth 2 (two) times daily. 06/22/16  Yes Smiley Houseman, MD  venlafaxine XR (EFFEXOR-XR) 150 MG 24 hr capsule  04/18/16  Yes [provider]  triamcinolone ointment (KENALOG) 0.5 % Apply 1 application topically 2 (two) times daily. 07/19/16   [name], MD    Objective:   Vitals:   07/19/16 0907  BP: (!) 153/87  Pulse: (!) 124  Resp: 18  Temp: 98.3 F (36.8 C)  TempSrc: Oral  SpO2: 95%  Weight: 177 lb (80.3 kg)  Height: 5\' 6"  (1.676 m)   Exam General appearance : Awake, alert, not in any distress. Speech Clear. Chronically ill-looking HEENT: Atraumatic and Normocephalic, pupils equally reactive to light and accomodation Neck: Supple, no JVD. No cervical lymphadenopathy.  Chest: Good air entry bilaterally, no added sounds  CVS: S1 S2 regular, no murmurs.  Abdomen: Bowel sounds present, Non tender and not distended with no gaurding, rigidity or rebound. Extremities: B/L Lower Ext shows no edema, both legs are warm to touch Neurology: Awake alert, and oriented X 3, CN II-XII intact, Non focal Skin: Multiple acne-like bumps on skin mostly head and neck area, darkened, hyperpigmented  Data Review Lab Results  Component Value Date   HGBA1C <4.2 (L) 05/12/2016    Assessment & Plan   1. Acne-like skin bumps  - Ambulatory referral to Dermatology - triamcinolone ointment (KENALOG) 0.5 %; Apply 1 application topically 2 (two) times daily.  Dispense: 30 g; Refill: 2  2. ESRD on dialysis [facility])  - Continue Hemodialysis - Follow up with Nephrologist  3. Human immunodeficiency virus (HIV)  disease ([place])  - Continue current medications - Follow up with Infectious disease as scheduled  Patient have been counseled extensively about nutrition and exercise. Other issues discussed during this visit include: low cholesterol diet, weight control and daily exercise, importance of adherence with medications and regular follow-up. We also discussed long term complications of uncontrolled hypertension.   Return in about 3 months (around 10/19/2016) for CKD/ESRD.  The patient was given clear instructions to go to ER or return to medical center if symptoms don't improve, worsen or new problems develop. The patient verbalized understanding. The patient was told to call to get lab results if they haven't heard anything in the next week.   This note has been created with Surveyor, quantity. Any transcriptional errors are unintentional.    Caydence Enck, MD, MHA, [place], [place], [place] and [facility]  [place], [place]   07/19/2016, 9:37 AM

## 2016-07-19 NOTE — Progress Notes (Signed)
Patient is here for FU HTN  Patient denies pain at this time.  Patient has taken medication today. Patient has eaten today.

## 2016-07-19 NOTE — Patient Instructions (Signed)
End-Stage Kidney Disease °End-stage kidney disease occurs when the kidneys are so damaged that they cannot do their job. The kidneys are two organs that do many important jobs in the body, which include: °· Removing wastes and extra fluids from the blood. °· Making hormones that maintain the amount of fluid in your tissues and blood vessels. °· Maintaining the right amount of fluids and chemicals in the body. ° °When the kidneys are damaged and cannot do their job, life-threatening problems occur. Without the help of the kidneys, toxins build up in the blood. In end-stage kidney disease, the kidneys cannot get better. °What are the causes? °End-stage kidney disease usually occurs when a long-lasting (chronic) kidney disease gets worse. It may also occur after the kidneys are suddenly damaged (acute kidney injury). °What increases the risk? °This condition is more likely to develop in people who are: °· Older than age 60. °· Male. °· Of African-American descent. °· Current smokers or former smokers. °· Obese. ° °You may also have an increased risk for end-stage kidney disease if you: °· Have a family history of chronic kidney disease (CKD). °· Have had kidney disease for many years. °· Have other longstanding medical conditions that affect the kidneys, such as: °? Cardiovascular disease including high blood pressure. °? Diabetes. °? Certain diseases that affect the immune system. ° °What are the signs or symptoms? °· Swelling (edema) of the face, legs, ankles, or feet. °· Numbness, tingling, or loss of feeling (sensation) in your hands or feet. °· Tiredness (lethargy). °· Nausea or vomiting. °· Confusion, trouble concentrating, or loss of consciousness. °· Chest pain. °· Shortness of breath. °· Little to no urine production. °· Muscle twitches and cramps, especially in the legs. °· Constant itchiness. °· Loss of appetite. °· Pale skin and tissue lining your eyelids (conjunctiva). °· Headaches. °· Abnormally dark or  light skin. °· Decrease in muscle size (muscle wasting). °· Easy bruising. °· Frequent hiccups. °· Stopping of menstruation in women. °· Seizures. °How is this diagnosed? °Your health care provider will measure your blood pressure and do some tests. These may include: °· Urine tests. °· Blood tests. °· Imaging tests. °· A test in which a sample of tissue is removed from the kidneys to be looked at under a microscope (kidney biopsy). ° °How is this treated? °There are two treatments for end-stage kidney disease: °· A procedure that removes toxic wastes from the body (dialysis). Depending on the type of dialysis you choose, it may be performed more than one time a day (peritoneal dialysis) or several times a week (hemodialysis). °· Surgery to receive a new kidney (kidney transplant). ° °In addition to having dialysis or a kidney transplant, you may need to take medicines: °· To control high blood pressure (hypertension). °· To control cholesterol. °· To maintain healthy electrolyte levels in your blood. ° °You may also be given a specific diet to follow that includes requirements or limits for: °· Salt (sodium). °· Protein. °· Phosphorous. °· Potassium. °· Calcium. ° °Follow these instructions at home: °· Follow your prescribed diet. °· Take over-the-counter and prescription medicines only as told by your health care provider. °? Do not take any new medicines unless approved by your health care provider. Many medicines can worsen your kidney damage. °? Do not take any vitamin and mineral supplements unless approved by your health care provider. Many nutritional supplements can worsen your kidney damage. °? The dose of some medicines that you take may need to be   products, such as cigarettes, chewing tobacco, and e-cigarettes. If you need help quitting, ask your health care provider.  Keep all follow-up visits as told by your health care provider. This is important.  Keep track of your blood  pressure. Report changes in your blood pressure as told by your health care provider.  Achieve and maintain a healthy weight. If you need help with this, ask your health care provider.  Start or continue an exercise plan. Try to exercise at least 30 minutes a day, 5 days a week.  Stay current with immunizations as told by your health care provider. Where to find more information:  American Association of Kidney Patients: BombTimer.gl  National Kidney Foundation: www.kidney.[place]: https://mathis.com/  [facility]: www.lifeoptions.org and www.kidneyschool.org Contact a health care provider if:  Your symptoms get worse.  You develop new symptoms. Get help right away if:  You have weakness in an arm or leg on one side of your body.  You have difficulty speaking or you are slurring your speech.  You have a sudden change in your vision.  You have a sudden, severe headache.  You have a sudden weight increase.  You have difficulty breathing.  Your symptoms suddenly get worse. This information is not intended to replace advice given to you by your health care provider. Make sure you discuss any questions you have with your health care provider. Document Released: 05/06/2003 Document Revised: 07/22/2015 Document Reviewed: 10/13/2011 Elsevier Interactive Patient Education  2017 [employer]. Acne Acne is a skin problem that causes pimples. Acne occurs when the pores in the skin get blocked. The pores may become infected with bacteria, or they may become red, sore, and swollen. Acne is a common skin problem, especially for teenagers. Acne usually goes away over time. What are the causes? Each pore contains an oil gland. Oil glands make an oily substance that is called sebum. Acne happens when these glands get plugged with sebum, dead skin cells, and dirt. Then, the bacteria that are normally found in the oil glands multiply and cause  inflammation. Acne is commonly triggered by changes in your hormones. These hormonal changes can cause the oil glands to get bigger and to make more sebum. Factors that can make acne worse include:  Hormone changes during:  Adolescence.  Women's menstrual cycles.  Pregnancy.  Oil-based cosmetics and hair products.  Harshly scrubbing the skin.  Strong soaps.  Stress.  Hormone problems that are due to certain diseases.  Long or oily hair rubbing against the skin.  Certain medicines.  Pressure from headbands, backpacks, or shoulder pads.  Exposure to certain oils and chemicals. What increases the risk? This condition is more likely to develop in:  Teenagers.  People who have a family history of acne. What are the signs or symptoms? Acne often occurs on the face, neck, chest, and upper back. Symptoms include:  Small, red bumps (pimples or papules).  Whiteheads.  Blackheads.  Small, pus-filled pimples (pustules).  Big, red pimples or pustules that feel tender. More severe acne can cause:  An infected area that contains a collection of pus (abscess).  Hard, painful, fluid-filled sacs (cysts).  Scars. How is this diagnosed? This condition is diagnosed with a medical history and physical exam. Blood tests may also be done. How is this treated? Treatment for this condition can vary depending on the severity of your acne. Treatment may include:  Creams and lotions that prevent oil glands from clogging.  Creams  and lotions that treat or prevent infections and inflammation.  Antibiotic medicines that are applied to the skin or taken as a pill.  Pills that decrease sebum production.  Birth control pills.  Light or laser treatments.  Surgery.  Injections of medicine into the affected areas.  Chemicals that cause peeling of the skin. Your health care provider will also recommend the best way to take care of your skin. Good skin care is the most important  part of treatment. Follow these instructions at home: Skin care  Take care of your skin as told by your health care provider. You may be told to do these things:  Wash your skin gently at least two times each day, as well as:  After you exercise.  Before you go to bed.  Use mild soap.  Apply a water-based skin moisturizer after you wash your skin.  Use a sunscreen or sunblock with SPF 30 or greater. This is especially important if you are using acne medicines.  Choose cosmetics that will not plug your oil glands (are noncomedogenic). Medicines   Take over-the-counter and prescription medicines only as told by your health care provider.  If you were prescribed an antibiotic medicine, apply or take it as told by your health care provider. Do not stop taking the antibiotic even if your condition improves. General instructions   Keep your hair clean and off of your face. If you have oily hair, shampoo your hair regularly or daily.  Avoid leaning your chin or forehead against your hands.  Avoid wearing tight headbands or hats.  Avoid picking or squeezing your pimples. That can make your acne worse and cause scarring.  Keep all follow-up visits as told by your health care provider. This is important.  Shave gently and only when necessary.  Keep a food journal to figure out if any foods are linked with your acne. Contact a health care provider if:  Your acne is not better after eight weeks.  Your acne gets worse.  You have a large area of skin that is red or tender.  You think that you are having side effects from any acne medicine. This information is not intended to replace advice given to you by your health care provider. Make sure you discuss any questions you have with your health care provider. Document Released: 02/11/2000 Document Revised: 10/15/2015 Document Reviewed: 04/22/2014 Elsevier Interactive Patient Education  2017 [employer].

## 2016-07-21 ENCOUNTER — Encounter: Payer: Self-pay | Admitting: Internal Medicine

## 2016-07-28 ENCOUNTER — Ambulatory Visit (INDEPENDENT_AMBULATORY_CARE_PROVIDER_SITE_OTHER): Payer: Self-pay | Admitting: Pharmacist

## 2016-07-28 DIAGNOSIS — B20 Human immunodeficiency virus [HIV] disease: Secondary | ICD-10-CM

## 2016-07-28 LAB — T-HELPER CELL (CD4) - (RCID CLINIC ONLY)
CD4 % Helper T Cell: 7 % — ABNORMAL LOW (ref 33–55)
CD4 T Cell Abs: 70 /uL — ABNORMAL LOW (ref 400–2700)

## 2016-07-28 MED ORDER — DAPSONE 100 MG PO TABS: 100.0000 mg | ORAL_TABLET | Freq: Every day | ORAL | 11 refills | Status: DC

## 2016-07-28 NOTE — Progress Notes (Signed)
HPI: Jim Wyatt is a 36 y.o. male who presents to the [facility] for HIV adherence follow-up.   Allergies: Allergies  Allergen Reactions  . Bactrim [Sulfamethoxazole-Trimethoprim] Other (See Comments)    Renal failure  . Reglan [Metoclopramide] Other (See Comments)    Causes ticks/ jerks  . Vancomycin Other (See Comments)    Patient states vancomycin caused kidney injury  . Metoprolol Hives    Pt reports hives  . Amlodipine Palpitations and Other (See Comments)    fatigue  . Augmentin [Amoxicillin-Pot Clavulanate] Hives  . Procardia [Nifedipine] Hives    Pt reports hives, itching, SOB    Past Medical History: Past Medical History:  Diagnosis Date  . Asthma   . CAP (community acquired pneumonia) 10/2013  . Cellulitis 11/2013   LLE  . DVT (deep venous thrombosis) ([place]) 10/2013   BLE  . 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  Went on HD Dec 2016.   [name] Fever   . Headache   . HIV infection ([place]) dx'd 10/2013  . Hypertension   . Leg pain   . Multiple environmental allergies    "trees, dogs, cats"  . Multiple food allergies   . Shingles < 2010    Social History: Social History   Social History  . Marital status: Single    Spouse name: N/A  . Number of children: N/A  . Years of education: N/A   Social History Main Topics  . Smoking status: Never Smoker  . Smokeless tobacco: Never Used  . Alcohol use No     Comment: I stopped drinking along time ago "  . Drug use: No  . Sexual activity: Not on file   Other Topics Concern  . Not on file   Social History Narrative  . No narrative on file    Current Regimen: Tivicay + Descovy  Labs: HIV 1 RNA Quant (copies/mL)  Date Value  06/23/2016 149 (H)  02/01/2016 455,000  11/05/2015 125,000   CD4 T Cell Abs (/uL)  Date  Value  07/10/2016 50 (L)  06/23/2016 60 (L)  05/12/2016 4 (L)   Hep B S Ab (no units)  Date Value  12/30/2015 Non Reactive   Hepatitis B Surface Ag (no units)  Date Value  03/16/2016 Negative   HCV Ab (no units)  Date Value  11/10/2013 NEGATIVE    CrCl: CrCl cannot be calculated (Patient's most recent lab result is older than the maximum 21 days allowed.).  Lipids:    Component Value Date/Time   CHOL 100 11/15/2013 0820   TRIG 327 (H) 11/15/2013 0820   HDL 8 (L) 11/15/2013 0820   CHOLHDL 12.5 11/15/2013 0820   VLDL 65 (H) 11/15/2013 0820   LDLCALC 27 11/15/2013 0820    Assessment: Jim Wyatt is here today for HIV follow-up.  He has a long history of issues with compliance and recently restarted Tivicay and Descovy.  He comes in today just getting off work at [employer]. He states he has religiously taken his medications since being discharged from the hospital a few months ago.  He states he has missed no doses.  He went from taking it at night before going to work to in the morning before he heads to bed after getting off of work and it seems to work better for  him.  He states he is very motivated to get undetectable so that he can get a kidney transplant.  He is actually asking for a more potent and aggressive HIV regimen.  I explained to him that Tivicay and Decovy are excellent medications and should be potent enough to get him undetectable if he takes them consistently.  When his HIV viral load was last checked in April, it went from 175,000 to 149 in just 3 weeks.  I told him that he would probably be undetectable by now.  He is asking for a refill on his dapsone, so I will send that in for him.  He will get labs today and come back and see [place] in 2 weeks, then f/u with Dr. Tommy Wyatt in a little over a month. Encouraged him to stay on track.   Plans: - Continue Tivicay and Descovy - HIV VL and CD4 today - Dapsone 100 mg PO daily sent to [facility] for him - F/u with pharmacy again  6/18 at 9am - F/u with Dr. Tommy Wyatt 7/9 at 3:30pm  Shiori Adcox L. Jafar Poffenberger, PharmD, [place] [facility] 07/28/2016, 1:49 PM

## 2016-08-01 ENCOUNTER — Emergency Department (HOSPITAL_COMMUNITY)
Admission: EM | Admit: 2016-08-01 | Discharge: 2016-08-02 | Disposition: A | Discharge status: Home / Self Care | Payer: BLUE CROSS/BLUE SHIELD | Attending: Emergency Medicine | Admitting: Emergency Medicine

## 2016-08-01 ENCOUNTER — Encounter (HOSPITAL_COMMUNITY): Payer: Self-pay

## 2016-08-01 DIAGNOSIS — H9201 Otalgia, right ear: Secondary | ICD-10-CM | POA: Insufficient documentation

## 2016-08-01 DIAGNOSIS — R519 Headache, unspecified: Secondary | ICD-10-CM

## 2016-08-01 DIAGNOSIS — Z992 Dependence on renal dialysis: Secondary | ICD-10-CM | POA: Insufficient documentation

## 2016-08-01 DIAGNOSIS — Z79899 Other long term (current) drug therapy: Secondary | ICD-10-CM | POA: Insufficient documentation

## 2016-08-01 DIAGNOSIS — R51 Headache: Secondary | ICD-10-CM | POA: Insufficient documentation

## 2016-08-01 DIAGNOSIS — J45909 Unspecified asthma, uncomplicated: Secondary | ICD-10-CM | POA: Insufficient documentation

## 2016-08-01 DIAGNOSIS — N186 End stage renal disease: Secondary | ICD-10-CM | POA: Insufficient documentation

## 2016-08-01 DIAGNOSIS — I120 Hypertensive chronic kidney disease with stage 5 chronic kidney disease or end stage renal disease: Secondary | ICD-10-CM | POA: Insufficient documentation

## 2016-08-01 NOTE — ED Triage Notes (Signed)
Pt presents with 2 day h/o R temporal headache.  Pt reports pain has been constant, reports pain in R ear.  +nausea and vomiting, denies photophobia.  Pt received full treatment of HD today.

## 2016-08-01 NOTE — ED Provider Notes (Signed)
[facility] DEPT Provider Note   CSN: [id number] Arrival date & time: 08/01/16  2053  By signing my name below, I, Jaquelyn Bitter., attest that this documentation has been prepared under the direction and in the presence of Layanna Charo, Barbette Hair, MD. Electronically signed: Jaquelyn Bitter., ED Scribe. 08/02/16. 2:01 AM.   History   Chief Complaint Chief Complaint  Patient presents with  . Headache    HPI Jim Wyatt is a 36 y.o. male with hx of seizures, renal disease, dialysis, HIV who presents to the Emergency Department complaining of constant headache with onset x2 days. Pt states that he has had a constant headache for the past x2 days that he describes as throbbing. Pt states that the pain is localized to the R side. Pt rates the pain "one million." Pt reports nausea and right ear pain. He has taken Tylenol for the past x1 day with no relief. He denies photophobia, weakness, numbness, tingling, neck stiffness, SOB. Denies fevers. Of note, pt's last dialysis was this morning which he reports went well. He states that the sometimes has headaches during dialysis but denies a hx of these headaches.  The history is provided by the patient. No language interpreter was used.    Past Medical History:  Diagnosis Date  . Asthma   . CAP (community acquired pneumonia) 10/2013  . Cellulitis 11/2013   LLE  . DVT (deep venous thrombosis) ([place]) 10/2013   BLE  . 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  Went on HD Dec 2016.   [name] Fever   . Headache   . HIV infection ([place]) dx'd 10/2013  . Hypertension   . Leg pain   . Multiple environmental allergies    "trees, dogs, cats"  . Multiple food allergies   . Shingles < 2010    Patient Active Problem List   Diagnosis Date Noted  . Acne-like skin  bumps 07/19/2016  . Neuropathic pain 07/05/2016  . Pruritus 07/05/2016  . Anasarca   . Hypervolemia   . Chronic diarrhea   . ESRD on hemodialysis ([place])   . Acute pulmonary edema (HCC)   . SIRS (systemic inflammatory response syndrome) ([place]) 05/30/2016  . Acute respiratory failure ([place]) 05/30/2016  . 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 Noncompliance   . End stage renal disease ([place])   . Chest pressure   . Palliative care encounter   . Goals of care, counseling/discussion   . Shortness of breath 05/12/2016  . Dyspnea   . Hypertensive emergency   . Pericardial effusion   . Tachycardia   . HIV disease ([place])   . OSA (obstructive sleep apnea) 04/19/2016  . Hospital discharge follow-up 04/19/2016  . Depression 03/24/2016  . Breast lump or mass   . Rectal bleeding   . GI bleed 03/15/2016  . Hyperkalemia   . New onset seizure ([place]) 03/13/2016  . Symptomatic anemia 02/01/2016  . Dysuria 01/18/2016  . Hematochezia 01/09/2016  . Postoperative anemia due to acute blood loss 01/09/2016  . Seizure disorder ([place]) 12/29/2015  . Intussusception ([place]) 12/28/2015  . Hypertension 12/28/2015  . Otitis externa of both ears 11/21/2015  . AIDS (acquired immune deficiency syndrome) ([place])   . Meningitis   . Cephalalgia   . Blurry vision, bilateral   .  Headache   . Hypertensive urgency   . New onset of headache in immunocompromised patient ([place]) 07/06/2015  . ESRD on dialysis ([place]) 04/27/2015  . CAP (community acquired pneumonia) 02/25/2015  . ESRD (end stage renal disease) ([place]) 02/16/2015  . History of DVT (deep vein thrombosis) 01/12/2015  . Candidal esophagitis ([place])   . Constipation   . Nausea without vomiting 12/20/2014  . Abscess of right thigh 12/19/2014  . Chronic kidney disease (CKD), stage IV (severe) ([place]) 12/19/2014  . 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 Anemia due to end stage renal disease ([place]) 12/19/2014  . Hyponatremia 12/19/2014  .  AIDS ([place])   . Community acquired pneumonia 07/30/2014  . Anemia of chronic disease 07/30/2014  . Acute on chronic renal failure ([place])   . 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 CMV (cytomegalovirus infection) ([place]) 12/07/2013  . Protein-calorie malnutrition, severe ([place]) 12/06/2013  . Human immunodeficiency virus (HIV) disease ([place]) 11/27/2013  . 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 DVT (deep venous thrombosis) ([place]) 11/24/2013  . Asthma, chronic 11/24/2013  . Acquired immune deficiency syndrome ([place]) 11/14/2013  . AKI (acute kidney injury) ([place]) 11/11/2013  . Elevated liver enzymes 11/09/2013  . History of shingles 11/09/2013  . Obesity 11/09/2013  . Abdominal pain 11/07/2013  . Asthma     Past Surgical History:  Procedure Laterality Date  . AV FISTULA PLACEMENT Left 03/03/2015   Procedure: Creation of Left Arm RADIOCEPHALIC ARTERIOVENOUS FISTULA ;  Surgeon: Mal Misty, MD;  Location: [facility];  Service: Vascular;  Laterality: Left;  . COLONOSCOPY N/A 03/18/2016   Procedure: COLONOSCOPY;  Surgeon: Manus Gunning, MD;  Location: [facility];  Service: Gastroenterology;  Laterality: N/A;  . FEMUR FRACTURE SURGERY Right 09/2001  . FRACTURE SURGERY    . I&D EXTREMITY Right 12/18/2014   Procedure: IRRIGATION AND DEBRIDEMENT RIGHT LOWER EXTREMITY;  Surgeon: Ralene Ok, MD;  Location: [facility];  Service: General;  Laterality: Right;  . INSERTION OF DIALYSIS CATHETER  2016  . LAPAROTOMY N/A 12/28/2015   Procedure: EXPLORATORY LAPAROTOMY BOWEL RESECTION ILEOCECECTOMY;  Surgeon: Clovis Riley, MD;  Location: [facility];  Service: General;  Laterality: N/A;       Home Medications    Prior to Admission medications   Medication Sig Start Date End Date Taking? Authorizing Provider  acetaminophen (TYLENOL) 500 MG tablet Take 1,000 mg by mouth every 6 (six) hours as needed (pain).   Yes [provider]  albuterol  (PROVENTIL HFA;VENTOLIN HFA) 108 (90 Base) MCG/ACT inhaler Inhale 2 puffs into the lungs every 6 (six) hours as needed for wheezing or shortness of breath. 06/22/16  Yes Smiley Houseman, MD  cloNIDine (CATAPRES - DOSED IN MG/24 HR) 0.1 mg/24hr patch Place 1 patch (0.1 mg total) onto the skin once a week. 06/28/16  Yes [place] Bing, DO  dapsone 100 MG tablet Take 1 tablet (100 mg total) by mouth daily. 07/28/16  Yes Tommy Medal, Lavell Islam, MD  dolutegravir (TIVICAY) 50 MG tablet Take 1 tablet (50 mg total) by mouth daily. 06/02/16  Yes Tommy Medal, Lavell Islam, MD  emtricitabine-tenofovir AF (DESCOVY) 200-25 MG tablet Take 1 tablet by mouth daily. 06/02/16  Yes Tommy Medal, Lavell Islam, MD  gabapentin (NEURONTIN) 100 MG capsule Take 1 capsule (100 mg total) by mouth 3 (three) times daily. 07/05/16  Yes [name], MD  hydrALAZINE (APRESOLINE) 50 MG tablet Take 2 tablets (100 mg total) by mouth 3 (three) times daily.  06/22/16  Yes Smiley Houseman, MD  hydrOXYzine (ATARAX/VISTARIL) 25 MG tablet Take 1 tablet (25 mg total) by mouth 3 (three) times daily as needed for itching. 07/05/16  Yes [name], MD  levETIRAcetam (KEPPRA) 250 MG tablet Take 1 tablet (250 mg) by mouth on Tuesday, Thursday, Saturday at 7pm Patient taking differently: Take 500-750 mg by mouth See admin instructions. 500mg  daily takes extra 250mg  on dialysis days Wallie Char, Sat 03/13/16  Yes Cameron Sprang, MD  losartan (COZAAR) 100 MG tablet Take 100 mg by mouth at bedtime.   Yes [provider]  pantoprazole (PROTONIX) 40 MG tablet Take 1 tablet (40 mg total) by mouth daily. 06/22/16  Yes [place] Bing, DO  sucralfate (CARAFATE) 1 GM/10ML suspension Take 10 mLs (1 g total) by mouth 4 (four) times daily -  with meals and at bedtime. 06/22/16  Yes [place] Bing, DO  topiramate (TOPAMAX) 50 MG tablet Take 1 tablet (50 mg total) by mouth 2 (two) times daily. 06/22/16  Yes Smiley Houseman, MD  triamcinolone  ointment (KENALOG) 0.5 % Apply 1 application topically 2 (two) times daily. 07/19/16  Yes [name], MD  venlafaxine XR (EFFEXOR-XR) 150 MG 24 hr capsule Take 150 mg by mouth daily with breakfast.  04/18/16  Yes [provider]  fluticasone (FLONASE) 50 MCG/ACT nasal spray Place 2 sprays into both nostrils daily. 08/02/16   Dorris Pierre, Barbette Hair, MD  predniSONE (DELTASONE) 50 MG tablet Take 1 tablet (50 mg total) by mouth daily. 08/02/16   Finnbar Cedillos, Barbette Hair, MD  sodium chloride (OCEAN) 0.65 % SOLN nasal spray Place 1 spray into both nostrils as needed for congestion. 08/02/16   Bridget Westbrooks, Barbette Hair, MD    Family History Family History  Problem Relation Age of Onset  . Hypertension Mother   . Diabetes Maternal Grandmother   . Kidney disease Maternal Grandmother     Social History Social History  Substance Use Topics  . Smoking status: Never Smoker  . Smokeless tobacco: Never Used  . Alcohol use No     Comment: I stopped drinking along time ago "     Allergies   Bactrim [sulfamethoxazole-trimethoprim]; Reglan [metoclopramide]; Vancomycin; Metoprolol; Amlodipine; Augmentin [amoxicillin-pot clavulanate]; and Procardia [nifedipine]   Review of Systems Review of Systems  Constitutional: Negative for fever.  HENT: Positive for ear pain. Negative for congestion.   Eyes: Positive for pain. Negative for photophobia.  Respiratory: Negative for cough and shortness of breath.   Cardiovascular: Negative for chest pain.  Gastrointestinal: Positive for nausea. Negative for abdominal pain and vomiting.  Musculoskeletal: Negative for neck stiffness.  Neurological: Positive for headaches. Negative for weakness and numbness.  All other systems reviewed and are negative.    Physical Exam Updated Vital Signs BP (!) 147/89   Pulse (!) 119   Temp 98.5 F (36.9 C) (Oral)   Resp 18   SpO2 92%   Physical Exam  Constitutional: He is oriented to person, place, and time. No distress.    Chronically ill-appearing, no acute distress  HENT:  Head: Normocephalic and atraumatic.  Fluid noted behind right TM, intact light reflex, no significant erythema  Eyes: Pupils are equal, round, and reactive to light.  Neck: Normal range of motion. Neck supple.  No meningismus  Cardiovascular: Normal rate, regular rhythm and normal heart sounds.   No murmur heard. Pulmonary/Chest: Effort normal and breath sounds normal. No respiratory distress. He has no wheezes.  Abdominal: Soft. There is  no tenderness.  Musculoskeletal: He exhibits no edema.  Neurological: He is alert and oriented to person, place, and time.  Cranial nerves II through XII intact, 5 out of 5 strength in all 4 extremities, no dysmetria to finger-nose-finger  Skin: Skin is warm and dry.  Psychiatric: He has a normal mood and affect.  Nursing note and vitals reviewed.    ED Treatments / Results   DIAGNOSTIC STUDIES: Oxygen Saturation is 96% on RA, adequate by my interpretation.   COORDINATION OF CARE: 2:01 AM-Discussed next steps with pt. Pt verbalized understanding and is agreeable with the plan.    Labs (all labs ordered are listed, but only abnormal results are displayed) Labs Reviewed - No data to display  EKG  EKG Interpretation None       Radiology Ct Head Wo Contrast  Result Date: 08/02/2016 CLINICAL DATA:  Headache. EXAM: CT HEAD WITHOUT CONTRAST TECHNIQUE: Contiguous axial images were obtained from the base of the skull through the vertex without intravenous contrast. COMPARISON:  Most recent head CT 05/09/2016. FINDINGS: Brain: No evidence of acute infarction, hemorrhage, hydrocephalus, extra-axial collection or mass lesion/mass effect. Vascular: No hyperdense vessel or unexpected calcification. Skull: Normal. Negative for fracture or focal lesion. Sinuses/Orbits: Chronic mucosal thickening of right side of sphenoid sinus, right greater than left maxillary sinuses. Mild mucosal thickening of the  right ethmoid air cells anteriorly has increased from prior exam. Mastoid air cells are well-aerated. Other: None. IMPRESSION: 1.  No acute intracranial abnormality. 2. Chronic sinus disease, mild increase in mucosal thickening in right ethmoid air cells from prior may reflect acute inflammation. Electronically Signed   By: Jeb Levering M.D.   On: 08/02/2016 00:59    Procedures Procedures (including critical care time)  Medications Ordered in ED Medications  prochlorperazine (COMPAZINE) injection 10 mg (10 mg Intravenous Given 08/02/16 0110)  diphenhydrAMINE (BENADRYL) injection 25 mg (25 mg Intravenous Given 08/02/16 0107)     Initial Impression / Assessment and Plan / ED Course  I have reviewed the triage vital signs and the nursing notes.  Pertinent labs & imaging results that were available during my care of the patient were reviewed by me and considered in my medical decision making (see chart for details).     Patient presents with headache. Right-sided. Also reports right ear pain. Does have a history of HIV but is nontoxic and vital signs are reassuring. He is afebrile. Neurologic exam is reassuring. Patient was given Compazine and Benadryl. He had a full dialysis session today. CT head obtained and shows chronic right sided sinusitis with some acute inflammation.  He has some fluid behind his right ear as well. Given that his headache is right-sided this could be the cause of his symptoms. On recheck he is resting comfortably. He remains nonfocal. Recommend nasal steroid, nasal saline, and a burst dose of steroids. He is afebrile and I do not feel that antibiotics at this time are warranted. Follow-up with primary physician.  After history, exam, and medical workup I feel the patient has been appropriately medically screened and is safe for discharge home. Pertinent diagnoses were discussed with the patient. Patient was given return precautions.   Final Clinical Impressions(s) / ED  Diagnoses   Final diagnoses:  Sinus headache  Right ear pain    New Prescriptions New Prescriptions   FLUTICASONE (FLONASE) 50 MCG/ACT NASAL SPRAY    Place 2 sprays into both nostrils daily.   PREDNISONE (DELTASONE) 50 MG TABLET    Take 1  tablet (50 mg total) by mouth daily.   SODIUM CHLORIDE (OCEAN) 0.65 % SOLN NASAL SPRAY    Place 1 spray into both nostrils as needed for congestion.   I personally performed the services described in this documentation, which was scribed in my presence. The recorded information has been reviewed and is accurate.     Merryl Hacker, MD 08/02/16 [phone]

## 2016-08-02 ENCOUNTER — Emergency Department (HOSPITAL_COMMUNITY): Payer: BLUE CROSS/BLUE SHIELD

## 2016-08-02 LAB — HIV-1 RNA QUANT-NO REFLEX-BLD
HIV 1 RNA Quant: 42 copies/mL — ABNORMAL HIGH
HIV-1 RNA Quant, Log: 1.62 Log copies/mL — ABNORMAL HIGH

## 2016-08-02 MED ORDER — PROCHLORPERAZINE EDISYLATE 5 MG/ML IJ SOLN
10.0000 mg | Freq: Once | INTRAMUSCULAR | Status: AC
  Administered 2016-08-02: 10 mg via INTRAVENOUS
  Filled 2016-08-02: qty 2

## 2016-08-02 MED ORDER — FLUTICASONE PROPIONATE 50 MCG/ACT NA SUSP: 2.0000 | Freq: Every day | NASAL | 0 refills | Status: DC

## 2016-08-02 MED ORDER — DIPHENHYDRAMINE HCL 50 MG/ML IJ SOLN
25.0000 mg | Freq: Once | INTRAMUSCULAR | Status: AC
  Administered 2016-08-02: 25 mg via INTRAVENOUS
  Filled 2016-08-02: qty 1

## 2016-08-02 MED ORDER — PREDNISONE 50 MG PO TABS: 50.0000 mg | ORAL_TABLET | Freq: Every day | ORAL | 0 refills | Status: DC

## 2016-08-02 MED ORDER — SALINE SPRAY 0.65 % NA SOLN: 1.0000 | NASAL | 0 refills | Status: DC | PRN

## 2016-08-02 NOTE — ED Notes (Signed)
Patient transported to CT 

## 2016-08-02 NOTE — Discharge Instructions (Signed)
You were seen today for a headache and ear pain. Her headache may be related to inflammation of your sinuses. This was seen on CT scan. Given that you do not have any fevers, I do not feel you need antibiotics at this time; however, nasal saline, nasal steroid, and a systemic steroid may be helpful.

## 2016-08-03 ENCOUNTER — Emergency Department (HOSPITAL_COMMUNITY)
Admission: EM | Admit: 2016-08-03 | Discharge: 2016-08-03 | Disposition: A | Discharge status: Home / Self Care | Payer: Self-pay | Attending: Emergency Medicine | Admitting: Emergency Medicine

## 2016-08-03 ENCOUNTER — Encounter (HOSPITAL_COMMUNITY): Payer: Self-pay | Admitting: Vascular Surgery

## 2016-08-03 DIAGNOSIS — R21 Rash and other nonspecific skin eruption: Secondary | ICD-10-CM | POA: Insufficient documentation

## 2016-08-03 DIAGNOSIS — N186 End stage renal disease: Secondary | ICD-10-CM | POA: Insufficient documentation

## 2016-08-03 DIAGNOSIS — T7840XA Allergy, unspecified, initial encounter: Secondary | ICD-10-CM | POA: Diagnosis not present

## 2016-08-03 DIAGNOSIS — Z992 Dependence on renal dialysis: Secondary | ICD-10-CM | POA: Insufficient documentation

## 2016-08-03 DIAGNOSIS — Z79899 Other long term (current) drug therapy: Secondary | ICD-10-CM | POA: Insufficient documentation

## 2016-08-03 DIAGNOSIS — J45909 Unspecified asthma, uncomplicated: Secondary | ICD-10-CM | POA: Insufficient documentation

## 2016-08-03 DIAGNOSIS — I120 Hypertensive chronic kidney disease with stage 5 chronic kidney disease or end stage renal disease: Secondary | ICD-10-CM | POA: Insufficient documentation

## 2016-08-03 MED ORDER — FAMOTIDINE 20 MG PO TABS: 20.0000 mg | ORAL_TABLET | Freq: Two times a day (BID) | ORAL | 0 refills | Status: DC

## 2016-08-03 MED ORDER — DIPHENHYDRAMINE HCL 25 MG PO CAPS: 25.0000 mg | ORAL_CAPSULE | Freq: Four times a day (QID) | ORAL | 0 refills | Status: DC | PRN

## 2016-08-03 MED ORDER — FAMOTIDINE 20 MG PO TABS
20.0000 mg | ORAL_TABLET | Freq: Once | ORAL | Status: AC
  Administered 2016-08-03: 20 mg via ORAL
  Filled 2016-08-03: qty 1

## 2016-08-03 MED ORDER — PREDNISONE 20 MG PO TABS
60.0000 mg | ORAL_TABLET | Freq: Once | ORAL | Status: AC
  Administered 2016-08-03: 60 mg via ORAL
  Filled 2016-08-03: qty 3

## 2016-08-03 NOTE — ED Triage Notes (Signed)
Pt reports to the ED for eval of generalized itching and irritation. He states that he was given Hectorol, Mirecera, and Venofer during dialysis and states afterwards he started having severe itching. Denies any facial or lip swelling or SOB. He was given 50 mg of Benadryl at dialysis without relief in his symptoms.

## 2016-08-03 NOTE — ED Provider Notes (Signed)
[facility] DEPT Provider Note   CSN: [id number] Arrival date & time: 08/03/16  1151  By signing my name below, I, Theresia Bough, attest that this documentation has been prepared under the direction and in the presence of non-physician practitioner, Jeannett Senior, PA-C. Electronically Signed: Theresia Bough, ED Scribe. 08/03/16. 4:02 PM.  History   Chief Complaint Chief Complaint  Patient presents with  . Allergic Reaction   The history is provided by the patient. No language interpreter was used.   HPI Comments: Jim Wyatt is a 36 y.o. male with a PMHx of HIV, who presents to the Emergency Department complaining of moderate, persistent itchiness to his head, face, neck and chest onset this morning. Pt describes itchiness as a burning sensation. Pt was given Hectorol, Mirecera and Venofer this morning at Dialysis and states that his symptoms began then. No hx of similar symptoms. Pt has been on Dialysis for over 2 years. Pt has tried Benadryl with minimal relief of his symptoms. Pt was seen in the ED 2 days ago and diagnosed with a sinus infection and was prescribed prednisone that he has not started yet. Pt denies throat swelling, SOB, sensation to the tongue or any other complaints at this time.  Past Medical History:  Diagnosis Date  . Asthma   . CAP (community acquired pneumonia) 10/2013  . Cellulitis 11/2013   LLE  . DVT (deep venous thrombosis) ([place]) 10/2013   BLE  . 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  Went on HD Dec 2016.   [name] Fever   . Headache   . HIV infection ([place]) dx'd 10/2013  . Hypertension   . Leg pain   . Multiple environmental allergies    "trees, dogs, cats"  . Multiple food allergies   . Shingles < 2010    Patient Active Problem List   Diagnosis Date Noted  .  Acne-like skin bumps 07/19/2016  . Neuropathic pain 07/05/2016  . Pruritus 07/05/2016  . Anasarca   . Hypervolemia   . Chronic diarrhea   . ESRD on hemodialysis ([place])   . Acute pulmonary edema (HCC)   . SIRS (systemic inflammatory response syndrome) ([place]) 05/30/2016  . Acute respiratory failure ([place]) 05/30/2016  . 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 Noncompliance   . End stage renal disease ([place])   . Chest pressure   . Palliative care encounter   . Goals of care, counseling/discussion   . Shortness of breath 05/12/2016  . Dyspnea   . Hypertensive emergency   . Pericardial effusion   . Tachycardia   . HIV disease ([place])   . OSA (obstructive sleep apnea) 04/19/2016  . Hospital discharge follow-up 04/19/2016  . Depression 03/24/2016  . Breast lump or mass   . Rectal bleeding   . GI bleed 03/15/2016  . Hyperkalemia   . New onset seizure ([place]) 03/13/2016  . Symptomatic anemia 02/01/2016  . Dysuria 01/18/2016  . Hematochezia 01/09/2016  . Postoperative anemia due to acute blood loss 01/09/2016  . Seizure disorder ([place]) 12/29/2015  . Intussusception ([place]) 12/28/2015  . Hypertension 12/28/2015  . Otitis externa of both ears 11/21/2015  . AIDS (acquired immune deficiency syndrome) ([place])   . Meningitis   . Cephalalgia   . Blurry vision, bilateral   . Headache   .  Hypertensive urgency   . New onset of headache in immunocompromised patient ([place]) 07/06/2015  . ESRD on dialysis ([place]) 04/27/2015  . CAP (community acquired pneumonia) 02/25/2015  . ESRD (end stage renal disease) ([place]) 02/16/2015  . History of DVT (deep vein thrombosis) 01/12/2015  . Candidal esophagitis ([place])   . Constipation   . Nausea without vomiting 12/20/2014  . Abscess of right thigh 12/19/2014  . Chronic kidney disease (CKD), stage IV (severe) ([place]) 12/19/2014  . 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 Anemia due to end stage renal disease ([place]) 12/19/2014  . Hyponatremia  12/19/2014  . AIDS ([place])   . Community acquired pneumonia 07/30/2014  . Anemia of chronic disease 07/30/2014  . Acute on chronic renal failure ([place])   . 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 CMV (cytomegalovirus infection) ([place]) 12/07/2013  . Protein-calorie malnutrition, severe ([place]) 12/06/2013  . Human immunodeficiency virus (HIV) disease ([place]) 11/27/2013  . 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 DVT (deep venous thrombosis) ([place]) 11/24/2013  . Asthma, chronic 11/24/2013  . Acquired immune deficiency syndrome ([place]) 11/14/2013  . AKI (acute kidney injury) ([place]) 11/11/2013  . Elevated liver enzymes 11/09/2013  . History of shingles 11/09/2013  . Obesity 11/09/2013  . Abdominal pain 11/07/2013  . Asthma     Past Surgical History:  Procedure Laterality Date  . AV FISTULA PLACEMENT Left 03/03/2015   Procedure: Creation of Left Arm RADIOCEPHALIC ARTERIOVENOUS FISTULA ;  Surgeon: Mal Misty, MD;  Location: [facility];  Service: Vascular;  Laterality: Left;  . COLONOSCOPY N/A 03/18/2016   Procedure: COLONOSCOPY;  Surgeon: Manus Gunning, MD;  Location: [facility];  Service: Gastroenterology;  Laterality: N/A;  . FEMUR FRACTURE SURGERY Right 09/2001  . FRACTURE SURGERY    . I&D EXTREMITY Right 12/18/2014   Procedure: IRRIGATION AND DEBRIDEMENT RIGHT LOWER EXTREMITY;  Surgeon: Ralene Ok, MD;  Location: [facility];  Service: General;  Laterality: Right;  . INSERTION OF DIALYSIS CATHETER  2016  . LAPAROTOMY N/A 12/28/2015   Procedure: EXPLORATORY LAPAROTOMY BOWEL RESECTION ILEOCECECTOMY;  Surgeon: Clovis Riley, MD;  Location: [facility];  Service: General;  Laterality: N/A;       Home Medications    Prior to Admission medications   Medication Sig Start Date End Date Taking? Authorizing Provider  acetaminophen (TYLENOL) 500 MG tablet Take 1,000 mg by mouth every 6 (six) hours as needed (pain).    [provider]    albuterol (PROVENTIL HFA;VENTOLIN HFA) 108 (90 Base) MCG/ACT inhaler Inhale 2 puffs into the lungs every 6 (six) hours as needed for wheezing or shortness of breath. 06/22/16   Smiley Houseman, MD  cloNIDine (CATAPRES - DOSED IN MG/24 HR) 0.1 mg/24hr patch Place 1 patch (0.1 mg total) onto the skin once a week. 06/28/16    Bing, DO  dapsone 100 MG tablet Take 1 tablet (100 mg total) by mouth daily. 07/28/16   Truman Hayward, MD  dolutegravir (TIVICAY) 50 MG tablet Take 1 tablet (50 mg total) by mouth daily. 06/02/16   Truman Hayward, MD  emtricitabine-tenofovir AF (DESCOVY) 200-25 MG tablet Take 1 tablet by mouth daily. 06/02/16   Truman Hayward, MD  fluticasone [facility]) 50 MCG/ACT nasal spray Place 2 sprays into both nostrils daily. 08/02/16   Horton, Barbette Hair, MD  gabapentin (NEURONTIN) 100 MG capsule Take 1 capsule (100 mg total) by mouth 3 (three) times daily. 07/05/16   [name], MD  hydrALAZINE (APRESOLINE) 50 MG tablet Take 2 tablets (100 mg total) by mouth 3 (three) times daily. 06/22/16   Smiley Houseman, MD  hydrOXYzine (ATARAX/VISTARIL) 25 MG tablet Take 1 tablet (25 mg total) by mouth 3 (three) times daily as needed for itching. 07/05/16   [name], MD  levETIRAcetam (KEPPRA) 250 MG tablet Take 1 tablet (250 mg) by mouth on Tuesday, Thursday, Saturday at 7pm Patient taking differently: Take 500-750 mg by mouth See admin instructions. 500mg  daily takes extra 250mg  on dialysis days Wallie Char, Sat 03/13/16   Cameron Sprang, MD  losartan (COZAAR) 100 MG tablet Take 100 mg by mouth at bedtime.    [provider]  pantoprazole (PROTONIX) 40 MG tablet Take 1 tablet (40 mg total) by mouth daily. 06/22/16   [place] Bing, DO  predniSONE (DELTASONE) 50 MG tablet Take 1 tablet (50 mg total) by mouth daily. 08/02/16   Horton, Barbette Hair, MD  sodium chloride (OCEAN) 0.65 % SOLN nasal spray Place 1 spray into both nostrils as needed for  congestion. 08/02/16   Horton, Barbette Hair, MD  sucralfate (CARAFATE) 1 GM/10ML suspension Take 10 mLs (1 g total) by mouth 4 (four) times daily -  with meals and at bedtime. 06/22/16   [place] Bing, DO  topiramate (TOPAMAX) 50 MG tablet Take 1 tablet (50 mg total) by mouth 2 (two) times daily. 06/22/16   Smiley Houseman, MD  triamcinolone ointment (KENALOG) 0.5 % Apply 1 application topically 2 (two) times daily. 07/19/16   [name], MD  venlafaxine XR (EFFEXOR-XR) 150 MG 24 hr capsule Take 150 mg by mouth daily with breakfast.  04/18/16   [provider]    Family History Family History  Problem Relation Age of Onset  . Hypertension Mother   . Diabetes Maternal Grandmother   . Kidney disease Maternal Grandmother     Social History Social History  Substance Use Topics  . Smoking status: Never Smoker  . Smokeless tobacco: Never Used  . Alcohol use No     Comment: I stopped drinking along time ago "     Allergies   Bactrim [sulfamethoxazole-trimethoprim]; Reglan [metoclopramide]; Vancomycin; Metoprolol; Amlodipine; Augmentin [amoxicillin-pot clavulanate]; and Procardia [nifedipine]   Review of Systems Review of Systems  Constitutional: Negative for chills and fever.  HENT: Negative for congestion, mouth sores, sore throat and trouble swallowing.   Respiratory: Negative for shortness of breath.   Skin: Positive for rash.       Ichiness  All other systems reviewed and are negative.    Physical Exam Updated Vital Signs BP (!) 141/102 (BP Location: Right Arm)   Pulse (!) 128   Temp 98.6 F (37 C) (Oral)   Resp 16   SpO2 98%   Physical Exam  Constitutional: He is oriented to person, place, and time. He appears well-developed and well-nourished.  HENT:  Head: Normocephalic and atraumatic.  Normal lips, tongue, oropharynx. No edema noted. No oral lesions.  Cardiovascular: Regular rhythm and normal heart sounds.   Tachycardic  Pulmonary/Chest:  Effort normal and breath sounds normal. No respiratory distress. He has no wheezes.  Neurological: He is alert and oriented to person, place, and time.  Skin: Skin is warm and dry.  Papular rash to the face, neck, bilateral arms. No hives. Skin is dry. Excoriations noted.  Psychiatric: He has a normal mood and affect.  Nursing note and vitals reviewed.    ED Treatments / Results  DIAGNOSTIC STUDIES:  Oxygen Saturation is 98% on RA, normal by my interpretation.   COORDINATION OF CARE: 4:00 PM-Discussed next steps with pt including a dose of Benadryl, prednisone and Pepsid. Pt verbalized understanding and is agreeable with the plan.   Labs (all labs ordered are listed, but only abnormal results are displayed) Labs Reviewed - No data to display  EKG  EKG Interpretation None       Radiology Ct Head Wo Contrast  Result Date: 08/02/2016 CLINICAL DATA:  Headache. EXAM: CT HEAD WITHOUT CONTRAST TECHNIQUE: Contiguous axial images were obtained from the base of the skull through the vertex without intravenous contrast. COMPARISON:  Most recent head CT 05/09/2016. FINDINGS: Brain: No evidence of acute infarction, hemorrhage, hydrocephalus, extra-axial collection or mass lesion/mass effect. Vascular: No hyperdense vessel or unexpected calcification. Skull: Normal. Negative for fracture or focal lesion. Sinuses/Orbits: Chronic mucosal thickening of right side of sphenoid sinus, right greater than left maxillary sinuses. Mild mucosal thickening of the right ethmoid air cells anteriorly has increased from prior exam. Mastoid air cells are well-aerated. Other: None. IMPRESSION: 1.  No acute intracranial abnormality. 2. Chronic sinus disease, mild increase in mucosal thickening in right ethmoid air cells from prior may reflect acute inflammation. Electronically Signed   By: Jeb Levering M.D.   On: 08/02/2016 00:59    Procedures Procedures (including critical care time)  Medications Ordered in  ED Medications - No data to display   Initial Impression / Assessment and Plan / ED Course  I have reviewed the triage vital signs and the nursing notes.  Pertinent labs & imaging results that were available during my care of the patient were reviewed by me and considered in my medical decision making (see chart for details).     Patient did emergency department with itchy rash to the face, arms, neck after receiving some medications and dialysis today. He has been in the waiting room for 4 hours. He received Benadryl at dialysis Center approximately 6 hours ago. He states the itching is improved but the skin is still burning and the rash is still there. He denies any similar prior reactions. On exam no swelling to the oropharynx, tongue, lips. He is not having any difficulty with breathing or swallowing. He is tachycardic, but chart review revealing that patient is chronically tachycardic, he is aware. He is afebrile here. He was prescribed prednisone for possible sinusitis that he has not started yet. He has 5 doses of 50 mg that he just picked up prior to coming to the ED. Advised him to start taking prednisone, one dose given in ED. We will also add Benadryl and Pepcid. Follow-up closely with family doctor. Return if worsening symptoms. Patient agreeable to the plan.  Vitals:   08/03/16 1222  BP: (!) 141/102  Pulse: (!) 128  Resp: 16  Temp: 98.6 F (37 C)  TempSrc: Oral  SpO2: 98%     Final Clinical Impressions(s) / ED Diagnoses   Final diagnoses:  Allergic reaction, initial encounter    New Prescriptions New Prescriptions   DIPHENHYDRAMINE (BENADRYL) 25 MG CAPSULE    Take 1 capsule (25 mg total) by mouth every 6 (six) hours as needed.   FAMOTIDINE (PEPCID) 20 MG TABLET    Take 1 tablet (20 mg total) by mouth 2 (two) times daily.   I personally performed the services described in this documentation, which was scribed in my presence. The recorded information has been reviewed  and is accurate.     Jeannett Senior, PA-C  08/03/16 1624    Quintella Reichert, MD 08/04/16 0900

## 2016-08-03 NOTE — Discharge Instructions (Signed)
Make sure to take your prednisone. Take Benadryl every 6 hours. Pepcid twice a day for the next 3-4 days. Follow-up with family doctor not improving. Return if worsening symptoms

## 2016-08-13 ENCOUNTER — Emergency Department (HOSPITAL_COMMUNITY): Payer: Medicare Other

## 2016-08-13 ENCOUNTER — Emergency Department (HOSPITAL_COMMUNITY)
Admission: EM | Admit: 2016-08-13 | Discharge: 2016-08-13 | Disposition: A | Discharge status: Home / Self Care | Payer: Medicare Other | Attending: Emergency Medicine | Admitting: Emergency Medicine

## 2016-08-13 ENCOUNTER — Encounter (HOSPITAL_COMMUNITY): Payer: Self-pay | Admitting: Emergency Medicine

## 2016-08-13 DIAGNOSIS — Z79899 Other long term (current) drug therapy: Secondary | ICD-10-CM | POA: Insufficient documentation

## 2016-08-13 DIAGNOSIS — I10 Essential (primary) hypertension: Secondary | ICD-10-CM | POA: Diagnosis not present

## 2016-08-13 DIAGNOSIS — I120 Hypertensive chronic kidney disease with stage 5 chronic kidney disease or end stage renal disease: Secondary | ICD-10-CM | POA: Insufficient documentation

## 2016-08-13 DIAGNOSIS — Z992 Dependence on renal dialysis: Secondary | ICD-10-CM | POA: Insufficient documentation

## 2016-08-13 DIAGNOSIS — N186 End stage renal disease: Secondary | ICD-10-CM | POA: Insufficient documentation

## 2016-08-13 DIAGNOSIS — R51 Headache: Secondary | ICD-10-CM | POA: Insufficient documentation

## 2016-08-13 DIAGNOSIS — R0602 Shortness of breath: Secondary | ICD-10-CM | POA: Diagnosis not present

## 2016-08-13 DIAGNOSIS — I160 Hypertensive urgency: Secondary | ICD-10-CM | POA: Diagnosis not present

## 2016-08-13 DIAGNOSIS — R05 Cough: Secondary | ICD-10-CM | POA: Diagnosis not present

## 2016-08-13 LAB — BASIC METABOLIC PANEL
Anion gap: 12 (ref 5–15)
BUN: 53 mg/dL — ABNORMAL HIGH (ref 6–20)
CO2: 28 mmol/L (ref 22–32)
Calcium: 9.1 mg/dL (ref 8.9–10.3)
Chloride: 95 mmol/L — ABNORMAL LOW (ref 101–111)
Creatinine, Ser: 8.66 mg/dL — ABNORMAL HIGH (ref 0.61–1.24)
GFR calc Af Amer: 8 mL/min — ABNORMAL LOW (ref >60)
GFR calc non Af Amer: 7 mL/min — ABNORMAL LOW (ref >60)
Glucose, Bld: 84 mg/dL (ref 65–99)
Potassium: 4.1 mmol/L (ref 3.5–5.1)
Sodium: 135 mmol/L (ref 135–145)

## 2016-08-13 LAB — CBC WITH DIFFERENTIAL/PLATELET
Basophils Absolute: 0 10*3/uL (ref 0.0–0.1)
Basophils Relative: 0 %
Eosinophils Absolute: 0.4 10*3/uL (ref 0.0–0.7)
Eosinophils Relative: 5 %
HCT: 26.3 % — ABNORMAL LOW (ref 39.0–52.0)
Hemoglobin: 8.1 g/dL — ABNORMAL LOW (ref 13.0–17.0)
Lymphocytes Relative: 16 %
Lymphs Abs: 1.1 10*3/uL (ref 0.7–4.0)
MCH: 30 pg (ref 26.0–34.0)
MCHC: 30.8 g/dL (ref 30.0–36.0)
MCV: 97.4 fL (ref 78.0–100.0)
Monocytes Absolute: 0.6 10*3/uL (ref 0.1–1.0)
Monocytes Relative: 9 %
Neutro Abs: 4.9 10*3/uL (ref 1.7–7.7)
Neutrophils Relative %: 70 %
Platelets: 188 10*3/uL (ref 150–400)
RBC: 2.7 MIL/uL — ABNORMAL LOW (ref 4.22–5.81)
RDW: 18.5 % — ABNORMAL HIGH (ref 11.5–15.5)
WBC: 7 10*3/uL (ref 4.0–10.5)

## 2016-08-13 LAB — I-STAT TROPONIN, ED: Troponin i, poc: 0.12 ng/mL (ref 0.00–0.08)

## 2016-08-13 MED ORDER — ENALAPRILAT 1.25 MG/ML IV SOLN
0.6250 mg | Freq: Once | INTRAVENOUS | Status: AC
  Administered 2016-08-13: 0.625 mg via INTRAVENOUS
  Filled 2016-08-13: qty 1

## 2016-08-13 MED ORDER — CLONIDINE HCL 0.2 MG PO TABS
0.2000 mg | ORAL_TABLET | Freq: Once | ORAL | Status: AC
  Administered 2016-08-13: 0.2 mg via ORAL
  Filled 2016-08-13: qty 1

## 2016-08-13 MED ORDER — HYDRALAZINE HCL 20 MG/ML IJ SOLN
20.0000 mg | Freq: Once | INTRAMUSCULAR | Status: AC
  Administered 2016-08-13: 20 mg via INTRAVENOUS
  Filled 2016-08-13: qty 1

## 2016-08-13 MED ORDER — ISOSORBIDE MONONITRATE ER 30 MG PO TB24
60.0000 mg | ORAL_TABLET | Freq: Every day | ORAL | Status: DC
  Administered 2016-08-13: 60 mg via ORAL
  Filled 2016-08-13: qty 2

## 2016-08-13 MED ORDER — LOSARTAN POTASSIUM 50 MG PO TABS
100.0000 mg | ORAL_TABLET | Freq: Once | ORAL | Status: AC
  Administered 2016-08-13: 100 mg via ORAL
  Filled 2016-08-13: qty 2

## 2016-08-13 MED ORDER — NITROGLYCERIN IN D5W 200-5 MCG/ML-% IV SOLN
50.0000 ug/min | Freq: Once | INTRAVENOUS | Status: DC
  Filled 2016-08-13: qty 250

## 2016-08-13 MED ORDER — ISOSORBIDE MONONITRATE ER 60 MG PO TB24: 60.0000 mg | ORAL_TABLET | Freq: Every day | ORAL | 1 refills | Status: DC

## 2016-08-13 MED ORDER — CLONIDINE HCL 0.2 MG/24HR TD PTWK: 0.2000 mg | MEDICATED_PATCH | TRANSDERMAL | 12 refills | Status: DC

## 2016-08-13 MED ORDER — CLONIDINE HCL 0.1 MG PO TABS
0.1000 mg | ORAL_TABLET | Freq: Once | ORAL | Status: AC
  Administered 2016-08-13: 0.1 mg via ORAL
  Filled 2016-08-13: qty 1

## 2016-08-13 MED ORDER — TOPIRAMATE 25 MG PO TABS
50.0000 mg | ORAL_TABLET | Freq: Once | ORAL | Status: AC
  Administered 2016-08-13: 50 mg via ORAL
  Filled 2016-08-13: qty 2

## 2016-08-13 MED ORDER — PROCHLORPERAZINE EDISYLATE 5 MG/ML IJ SOLN
10.0000 mg | Freq: Once | INTRAMUSCULAR | Status: AC
  Administered 2016-08-13: 10 mg via INTRAVENOUS
  Filled 2016-08-13: qty 2

## 2016-08-13 MED ORDER — LEVETIRACETAM 500 MG PO TABS
500.0000 mg | ORAL_TABLET | Freq: Once | ORAL | Status: AC
  Administered 2016-08-13: 500 mg via ORAL
  Filled 2016-08-13: qty 1

## 2016-08-13 MED ORDER — HYDRALAZINE HCL 50 MG PO TABS
100.0000 mg | ORAL_TABLET | Freq: Once | ORAL | Status: AC
  Administered 2016-08-13: 100 mg via ORAL
  Filled 2016-08-13: qty 2

## 2016-08-13 MED ORDER — DIPHENHYDRAMINE HCL 50 MG/ML IJ SOLN
25.0000 mg | Freq: Once | INTRAMUSCULAR | Status: AC
  Administered 2016-08-13: 25 mg via INTRAVENOUS
  Filled 2016-08-13: qty 1

## 2016-08-13 NOTE — ED Notes (Signed)
Patient transported to CT 

## 2016-08-13 NOTE — ED Provider Notes (Addendum)
[facility] DEPT Provider Note   CSN: [id number] Arrival date & time: 08/13/16  0737     History   Chief Complaint Chief Complaint  Patient presents with  . Shortness of Breath    HPI Jim Wyatt is a 36 y.o. male.  HPI Patient reports he has had a headache since he finished dialysis yesterday. It started at the end of dialysis. He reports he does intermittently get headaches either during or after dialysis. Headache is generalized and throbbing. No nausea no vomiting. No weakness numbness tingling. No incoordination. Denies blurred vision, double vision or loss of vision. He has been trying Tylenol for pain without relief. No fever. Denies sinus symptoms. Reports he frequently has hot cold spells. Patient works night shift. He states he was dialyzed on schedule yesterday. He reports he started feeling somewhat short of breath today as well. He has had about 2 weeks of intermittent dry cough. No associated chest pain. No associated lower extremity pain or swelling.  Past Medical History:  Diagnosis Date  . Asthma   . CAP (community acquired pneumonia) 10/2013  . Cellulitis 11/2013   LLE  . DVT (deep venous thrombosis) ([place]) 10/2013   BLE  . ESRD on hemodialysis ([place]) 01/2015   AKI with nephrotic syndrome (16gm proteinuria at time of biopsy, from collapsing FSG). 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  Went on HD Dec 2016.   [name] Fever   . Headache   . HIV infection ([place]) dx'd 10/2013  . Hypertension   . Leg pain   . Multiple environmental allergies    "trees, dogs, cats"  . Multiple food allergies   . Shingles < 2010    Patient Active Problem List   Diagnosis Date Noted  . Acne-like skin bumps 07/19/2016  . Neuropathic pain 07/05/2016  . Pruritus 07/05/2016  . Anasarca   . Hypervolemia   . Chronic diarrhea   . ESRD on hemodialysis ([place])   . Acute pulmonary edema (HCC)   . SIRS (systemic inflammatory  response syndrome) ([place]) 05/30/2016  . Acute respiratory failure ([place]) 05/30/2016  . 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 Noncompliance   . End stage renal disease ([place])   . Chest pressure   . Palliative care encounter   . Goals of care, counseling/discussion   . Shortness of breath 05/12/2016  . Dyspnea   . Hypertensive emergency   . Pericardial effusion   . Tachycardia   . HIV disease ([place])   . OSA (obstructive sleep apnea) 04/19/2016  . Hospital discharge follow-up 04/19/2016  . Depression 03/24/2016  . Breast lump or mass   . Rectal bleeding   . GI bleed 03/15/2016  . Hyperkalemia   . New onset seizure ([place]) 03/13/2016  . Symptomatic anemia 02/01/2016  . Dysuria 01/18/2016  . Hematochezia 01/09/2016  . Postoperative anemia due to acute blood loss 01/09/2016  . Seizure disorder ([place]) 12/29/2015  . Intussusception ([place]) 12/28/2015  . Hypertension 12/28/2015  . Otitis externa of both ears 11/21/2015  . AIDS (acquired immune deficiency syndrome) ([place])   . Meningitis   . Cephalalgia   . Blurry vision, bilateral   . Headache   . Hypertensive urgency   . New onset of headache in immunocompromised patient ([place]) 07/06/2015  . ESRD on dialysis ([place]) 04/27/2015  . CAP (community acquired pneumonia) 02/25/2015  . ESRD (end stage renal disease) ([place]) 02/16/2015  .  History of DVT (deep vein thrombosis) 01/12/2015  . Candidal esophagitis ([place])   . Constipation   . Nausea without vomiting 12/20/2014  . Abscess of right thigh 12/19/2014  . Chronic kidney disease (CKD), stage IV (severe) ([place]) 12/19/2014  . 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 Anemia due to end stage renal disease ([place]) 12/19/2014  . Hyponatremia 12/19/2014  . AIDS ([place])   . Community acquired pneumonia 07/30/2014  . Anemia of chronic disease 07/30/2014  . Acute on chronic renal failure ([place])   . 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 CMV (cytomegalovirus infection)  ([place]) 12/07/2013  . Protein-calorie malnutrition, severe ([place]) 12/06/2013  . Human immunodeficiency virus (HIV) disease ([place]) 11/27/2013  . 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 DVT (deep venous thrombosis) ([place]) 11/24/2013  . Asthma, chronic 11/24/2013  . Acquired immune deficiency syndrome ([place]) 11/14/2013  . AKI (acute kidney injury) ([place]) 11/11/2013  . Elevated liver enzymes 11/09/2013  . History of shingles 11/09/2013  . Obesity 11/09/2013  . Abdominal pain 11/07/2013  . Asthma     Past Surgical History:  Procedure Laterality Date  . AV FISTULA PLACEMENT Left 03/03/2015   Procedure: Creation of Left Arm RADIOCEPHALIC ARTERIOVENOUS FISTULA ;  Surgeon: Mal Misty, MD;  Location: [facility];  Service: Vascular;  Laterality: Left;  . COLONOSCOPY N/A 03/18/2016   Procedure: COLONOSCOPY;  Surgeon: Manus Gunning, MD;  Location: [facility];  Service: Gastroenterology;  Laterality: N/A;  . FEMUR FRACTURE SURGERY Right 09/2001  . FRACTURE SURGERY    . I&D EXTREMITY Right 12/18/2014   Procedure: IRRIGATION AND DEBRIDEMENT RIGHT LOWER EXTREMITY;  Surgeon: Ralene Ok, MD;  Location: [facility];  Service: General;  Laterality: Right;  . INSERTION OF DIALYSIS CATHETER  2016  . LAPAROTOMY N/A 12/28/2015   Procedure: EXPLORATORY LAPAROTOMY BOWEL RESECTION ILEOCECECTOMY;  Surgeon: Clovis Riley, MD;  Location: [facility];  Service: General;  Laterality: N/A;       Home Medications    Prior to Admission medications   Medication Sig Start Date End Date Taking? Authorizing Provider  acetaminophen (TYLENOL) 500 MG tablet Take 1,000 mg by mouth every 6 (six) hours as needed (pain).    [provider]  albuterol (PROVENTIL HFA;VENTOLIN HFA) 108 (90 Base) MCG/ACT inhaler Inhale 2 puffs into the lungs every 6 (six) hours as needed for wheezing or shortness of breath. 06/22/16   Smiley Houseman, MD  cloNIDine (CATAPRES - DOSED IN  MG/24 HR) 0.1 mg/24hr patch Place 1 patch (0.1 mg total) onto the skin once a week. 06/28/16   [place] Bing, DO  cloNIDine (CATAPRES - DOSED IN MG/24 HR) 0.2 mg/24hr patch Place 1 patch (0.2 mg total) onto the skin once a week. 08/13/16   Charlesetta Shanks, MD  dapsone 100 MG tablet Take 1 tablet (100 mg total) by mouth daily. 07/28/16   Truman Hayward, MD  diphenhydrAMINE (BENADRYL) 25 mg capsule Take 1 capsule (25 mg total) by mouth every 6 (six) hours as needed. 08/03/16   Kirichenko, Tatyana, PA-C  dolutegravir (TIVICAY) 50 MG tablet Take 1 tablet (50 mg total) by mouth daily. 06/02/16   Truman Hayward, MD  emtricitabine-tenofovir AF (DESCOVY) 200-25 MG tablet Take 1 tablet by mouth daily. 06/02/16   Truman Hayward, MD  famotidine (PEPCID) 20 MG tablet Take 1 tablet (20 mg total) by mouth 2 (two) times daily. 08/03/16   Kirichenko, Tatyana, PA-C  fluticasone (FLONASE) 50 MCG/ACT nasal spray  Place 2 sprays into both nostrils daily. 08/02/16   Horton, Barbette Hair, MD  gabapentin (NEURONTIN) 100 MG capsule Take 1 capsule (100 mg total) by mouth 3 (three) times daily. 07/05/16   [name], MD  hydrALAZINE (APRESOLINE) 50 MG tablet Take 2 tablets (100 mg total) by mouth 3 (three) times daily. 06/22/16   Smiley Houseman, MD  hydrOXYzine (ATARAX/VISTARIL) 25 MG tablet Take 1 tablet (25 mg total) by mouth 3 (three) times daily as needed for itching. 07/05/16   [name], MD  isosorbide mononitrate (IMDUR) 60 MG 24 hr tablet Take 1 tablet (60 mg total) by mouth daily. 08/13/16   Charlesetta Shanks, MD  levETIRAcetam (KEPPRA) 250 MG tablet Take 1 tablet (250 mg) by mouth on Tuesday, Thursday, Saturday at 7pm Patient taking differently: Take 500-750 mg by mouth See admin instructions. 500mg  daily takes extra 250mg  on dialysis days Wallie Char, Sat 03/13/16   Cameron Sprang, MD  losartan (COZAAR) 100 MG tablet Take 100 mg by mouth at bedtime.    [provider]  pantoprazole  (PROTONIX) 40 MG tablet Take 1 tablet (40 mg total) by mouth daily. 06/22/16   [place] Bing, DO  predniSONE (DELTASONE) 50 MG tablet Take 1 tablet (50 mg total) by mouth daily. 08/02/16   Horton, Barbette Hair, MD  sodium chloride (OCEAN) 0.65 % SOLN nasal spray Place 1 spray into both nostrils as needed for congestion. 08/02/16   Horton, Barbette Hair, MD  sucralfate (CARAFATE) 1 GM/10ML suspension Take 10 mLs (1 g total) by mouth 4 (four) times daily -  with meals and at bedtime. 06/22/16   [place] Bing, DO  topiramate (TOPAMAX) 50 MG tablet Take 1 tablet (50 mg total) by mouth 2 (two) times daily. 06/22/16   Smiley Houseman, MD  triamcinolone ointment (KENALOG) 0.5 % Apply 1 application topically 2 (two) times daily. 07/19/16   [name], MD  venlafaxine XR (EFFEXOR-XR) 150 MG 24 hr capsule Take 150 mg by mouth daily with breakfast.  04/18/16   [provider]    Family History Family History  Problem Relation Age of Onset  . Hypertension Mother   . Diabetes Maternal Grandmother   . Kidney disease Maternal Grandmother     Social History Social History  Substance Use Topics  . Smoking status: Never Smoker  . Smokeless tobacco: Never Used  . Alcohol use No     Comment: I stopped drinking along time ago "     Allergies   Bactrim [sulfamethoxazole-trimethoprim]; Reglan [metoclopramide]; Vancomycin; Metoprolol; Amlodipine; Augmentin [amoxicillin-pot clavulanate]; and Procardia [nifedipine]   Review of Systems Review of Systems 10 Systems reviewed and are negative for acute change except as noted in the HPI.   Physical Exam Updated Vital Signs BP (!) 143/105   Pulse (!) 110   Temp 98.4 F (36.9 C) (Oral)   Resp (!) 22   SpO2 97%   Physical Exam  Constitutional: He is oriented to person, place, and time. He appears well-developed and well-nourished.  Appears uncomfortable. No respiratory distress or confusion.  HENT:  Head: Normocephalic and  atraumatic.  Nose: Nose normal.  Mouth/Throat: Oropharynx is clear and moist.  Eyes: EOM are normal. Pupils are equal, round, and reactive to light.  Neck: Neck supple.  Cardiovascular:  Tachycardia. Gallop with 2\6 systolic ejection murmur.  Pulmonary/Chest: Effort normal.  CTA  Abdominal: Soft. He exhibits no distension. There is no tenderness. There is no guarding.  Musculoskeletal: Normal  range of motion. He exhibits no edema or tenderness.  Neurological: He is alert and oriented to person, place, and time.  Skin: Skin is warm and dry.  Psychiatric: He has a normal mood and affect.     ED Treatments / Results  Labs (all labs ordered are listed, but only abnormal results are displayed) Labs Reviewed  BASIC METABOLIC PANEL - Abnormal; Notable for the following:       Result Value   Chloride 95 (*)    BUN 53 (*)    Creatinine, Ser 8.66 (*)    GFR calc non Af Amer 7 (*)    GFR calc Af Amer 8 (*)    All other components within normal limits  CBC WITH DIFFERENTIAL/PLATELET - Abnormal; Notable for the following:    RBC 2.70 (*)    Hemoglobin 8.1 (*)    HCT 26.3 (*)    RDW 18.5 (*)    All other components within normal limits  I-STAT TROPOININ, ED - Abnormal; Notable for the following:    Troponin i, poc 0.12 (*)    All other components within normal limits    EKG  EKG Interpretation  Date/Time:  Sunday August 13 2016 07:53:15 EDT Ventricular Rate:  113 PR Interval:    QRS Duration: 96 QT Interval:  357 QTC Calculation: 490 R Axis:   76 Text Interpretation:  Sinus tachycardia LAE, consider biatrial enlargement Left ventricular hypertrophy Anterior Q waves, possibly due to LVH Prolonged QT interval agree. compared to previous, dynamic lateral t wave flattening,variable on previous. Confirmed by Charlesetta Shanks [phone]) on 08/13/2016 8:11:01 AM       Radiology Dg Chest 2 View  Result Date: 08/13/2016 CLINICAL DATA:  36 year old male with a history of headache and  shortness of breath. Renal failure EXAM: CHEST  2 VIEW COMPARISON:  Multiple prior chest x-ray, and dating to May 2017. Most recent 06/17/2016. CT chest 06/17/2016 FINDINGS: Interval enlargement of the cardiac silhouette dated 04/04/2015, compatible with pericardial effusion which was evident on recent chest CT. Relatively unchanged cardiac diameter compared to the chest x-ray 06/17/2016 at the time of the CT. Current study demonstrates no pneumothorax. No large pleural effusion. Diffuse interlobular septal thickening with no confluent airspace disease. Peribronchial cuffing. Overall, aeration appears better than the recent chest x-ray. Hazy airspace opacities at the inferior lungs. No displaced fracture. IMPRESSION: Re- demonstration of cardiomegaly/increase cardiac diameter in this patient with a prior pericardial effusion. Persisting pericardial effusion not excluded. Overall aeration is improved compared to April 2018, with evidence on the current chest x-ray of either persisting or recurrent pulmonary edema. Electronically Signed   By: Corrie Mckusick D.O.   On: 08/13/2016 08:42   Ct Head Wo Contrast  Result Date: 08/13/2016 CLINICAL DATA:  Headache since yesterday. EXAM: CT HEAD WITHOUT CONTRAST TECHNIQUE: Contiguous axial images were obtained from the base of the skull through the vertex without intravenous contrast. COMPARISON:  08/02/2016 FINDINGS: Brain: The ventricles are normal in size and configuration. No extra-axial fluid collections are identified. The gray-white differentiation is normal. No CT findings for acute intracranial process such as hemorrhage or infarction. No mass lesions. The brainstem and cerebellum are grossly normal. Vascular:  No definite aneurysm or hyperdense vessels. Skull: Normal. Negative for fracture or focal lesion. Sinuses/Orbits: Chronic appearing sinus disease with complete opacification of the right maxillary sinus and mucoperiosteal thickening involving the left  maxillary sinus. Scattered bilateral ethmoid sinus disease, right greater than left. Chronic left half sphenoid sinus disease with  marked sinus wall thickening. Patient does not have final sinuses. The mastoid air cells and middle ear cavities are clear. Other: No scalp lesion or hematoma. IMPRESSION: No acute intracranial findings or mass lesion. Chronic sinus disease. Electronically Signed   By: Marijo Sanes M.D.   On: 08/13/2016 13:24    Procedures Procedures (including critical care time)  Medications Ordered in ED Medications  nitroGLYCERIN 50 mg in dextrose 5 % 250 mL (0.2 mg/mL) infusion (50 mcg/min Intravenous Refused 08/13/16 1249)  isosorbide mononitrate (IMDUR) 24 hr tablet 60 mg (60 mg Oral Given 08/13/16 1256)  hydrALAZINE (APRESOLINE) injection 20 mg (20 mg Intravenous Given 08/13/16 0755)  cloNIDine (CATAPRES) tablet 0.1 mg (0.1 mg Oral Given 08/13/16 0752)  losartan (COZAAR) tablet 100 mg (100 mg Oral Given 08/13/16 0830)  topiramate (TOPAMAX) tablet 50 mg (50 mg Oral Given 08/13/16 0751)  prochlorperazine (COMPAZINE) injection 10 mg (10 mg Intravenous Given 08/13/16 0758)  diphenhydrAMINE (BENADRYL) injection 25 mg (25 mg Intravenous Given 08/13/16 0756)  hydrALAZINE (APRESOLINE) injection 20 mg (20 mg Intravenous Given 08/13/16 0907)  enalaprilat (VASOTEC) injection 0.625 mg (0.625 mg Intravenous Given 08/13/16 1119)  enalaprilat (VASOTEC) injection 0.625 mg (0.625 mg Intravenous Given 08/13/16 1249)  cloNIDine (CATAPRES) tablet 0.2 mg (0.2 mg Oral Given 08/13/16 1248)  hydrALAZINE (APRESOLINE) tablet 100 mg (100 mg Oral Given 08/13/16 1424)  levETIRAcetam (KEPPRA) tablet 500 mg (500 mg Oral Given 08/13/16 1424)     Initial Impression / Assessment and Plan / ED Course  I have reviewed the triage vital signs and the nursing notes.  Pertinent labs & imaging results that were available during my care of the patient were reviewed by me and considered in my medical decision making (see  chart for details).    Consult:Dr. Paschal recommends at this time initiating nitroglycerin drip at 50 mcg/m fixed rate, add oral dose of Imdur. Consult hospitalist for admission to stepdown.  Final Clinical Impressions(s) / ED Diagnoses   Final diagnoses:  Hypertensive urgency  ESRD (end stage renal disease) on dialysis [facility])   Patient presents with complaint of headache that started after dialysis yesterday. He reports he does get headaches sometimes either during dialysis or after. He did not have other associated symptoms to suggest infectious etiology. He denies any generally ill with fevers, chills, photophobia, neck stiffness, nausea vomiting. Patient has diastolic blood pressure significantly elevated. He reports he has been compliant with his medications and dialysis. Treatment was initiated with a combination of oral medications and hydralazine and enalapril. Diastolic pressure remained 120s to 130s despite treatment. Patient did report improved headache and rested much of his visit (he works night shift). I discussed admission with the intensivist for hypertensive urgency. Recommendation was to initiate nitroglycerin drip based on patient's other allergies and adverse reactions (patient reported a calcium channel blockers and beta blockers gave him significant amount of hives). At the time the drip was about to be started, patient advised that he would not allow a nitro drip because he does not do well with nitroglycerin. At that time, pressures were showing some response to medications and now his diastolic blood pressure is around 105. He has consistently stayed with diastolic around 585-277 range for the past hour. Patient reports that is his normal diastolic reading and  his heart rate typically runs about 115. I did advise the patient I still wished to proceed with admission to the hospital with observation due to concerns for his significantly elevated blood pressure with risk factors for  complications of hypertension and additional observation to make sure this is stabilized and  Continues to improve. Patient however advised that he had to go to work tonight and he could not, and would not stay in the hospital so long as his numbers were close to his baseline. He could only be persuaded to stay, despite warnings and counseling on hypertensive emergency with stroke and MI, if his numbers remained persistently elevated.  New Prescriptions New Prescriptions   CLONIDINE (CATAPRES - DOSED IN MG/24 HR) 0.2 MG/24HR PATCH    Place 1 patch (0.2 mg total) onto the skin once a week.   ISOSORBIDE MONONITRATE (IMDUR) 60 MG 24 HR TABLET    Take 1 tablet (60 mg total) by mouth daily.     Charlesetta Shanks, MD 08/13/16 1448    Charlesetta Shanks, MD 08/13/16 [phone]

## 2016-08-13 NOTE — ED Notes (Signed)
Md at bedside

## 2016-08-13 NOTE — ED Triage Notes (Signed)
Per [facility]  Pt coming home with Chief complaint of SOB. Last dialyzed yesterday. Took a little extra fluid off. Pt had a little headache that got worse with time yesterday ands then pt began to have SOB. Pt was just here recently. HTN. No N/V/D

## 2016-08-13 NOTE — Discharge Instructions (Signed)
Your blood pressure needs to be better controlled on a regular basis.  With severely elevated blood pressure, you are at risk for heart attack and stroke.  You must follow-up with your doctor this week for monitoring of blood pressure to determine if it is being controlled by your medications.  Continue your regular blood pressure medications. Increase your clonidine dose to 0.2 mg weekly patch. Start Imdur as prescribed.

## 2016-08-13 NOTE — ED Notes (Signed)
Pt returns from ct remains on tele.

## 2016-08-14 ENCOUNTER — Ambulatory Visit (INDEPENDENT_AMBULATORY_CARE_PROVIDER_SITE_OTHER): Payer: Self-pay | Admitting: Pharmacist Clinician (PhC)/ Clinical Pharmacy Specialist

## 2016-08-14 DIAGNOSIS — B20 Human immunodeficiency virus [HIV] disease: Secondary | ICD-10-CM

## 2016-08-14 NOTE — Progress Notes (Signed)
HPI: Jim Wyatt is a 36 y.o. male who is here for his HIV follow up visit.   Allergies: Allergies  Allergen Reactions  . Bactrim [Sulfamethoxazole-Trimethoprim] Other (See Comments)    Renal failure  . Reglan [Metoclopramide] Other (See Comments)    Causes ticks/ jerks  . Vancomycin Other (See Comments)    Patient states vancomycin caused kidney injury  . Metoprolol Hives    Pt reports hives  . Amlodipine Palpitations and Other (See Comments)    fatigue  . Augmentin [Amoxicillin-Pot Clavulanate] Hives  . Procardia [Nifedipine] Hives    Pt reports hives, itching, SOB    Vitals:    Past Medical History: Past Medical History:  Diagnosis Date  . Asthma   . CAP (community acquired pneumonia) 10/2013  . Cellulitis 11/2013   LLE  . DVT (deep venous thrombosis) ([place]) 10/2013   BLE  . ESRD on hemodialysis ([place]) 01/2015   AKI with nephrotic syndrome (16gm proteinuria at time of biopsy, from collapsing FSG). 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  Went on HD Dec 2016.   [name] Fever   . Headache   . HIV infection ([place]) dx'd 10/2013  . Hypertension   . Leg pain   . Multiple environmental allergies    "trees, dogs, cats"  . Multiple food allergies   . Shingles < 2010    Social History: Social History   Social History  . Marital status: Single    Spouse name: N/A  . Number of children: N/A  . Years of education: N/A   Social History Main Topics  . Smoking status: Never Smoker  . Smokeless tobacco: Never Used  . Alcohol use No     Comment: I stopped drinking along time ago "  . Drug use: No  . Sexual activity: Not on file   Other Topics Concern  . Not on file   Social History Narrative  . No narrative on file    Previous Regimen: ABC/DTG/Prez  Current Regimen: Descovy/DTG  Labs: HIV 1 RNA Quant (copies/mL)  Date Value  07/28/2016 42 (H)  06/23/2016 149 (H)  02/01/2016 455,000   CD4 T Cell Abs  (/uL)  Date Value  07/28/2016 70 (L)  07/10/2016 50 (L)  06/23/2016 60 (L)   Hep B S Ab (no units)  Date Value  12/30/2015 Non Reactive   Hepatitis B Surface Ag (no units)  Date Value  03/16/2016 Negative   HCV Ab (no units)  Date Value  11/10/2013 NEGATIVE    CrCl: CrCl cannot be calculated (Unknown ideal weight.).  Lipids:    Component Value Date/Time   CHOL 100 11/15/2013 0820   TRIG 327 (H) 11/15/2013 0820   HDL 8 (L) 11/15/2013 0820   CHOLHDL 12.5 11/15/2013 0820   VLDL 65 (H) 11/15/2013 0820   LDLCALC 27 11/15/2013 0820    Assessment: Jim Wyatt just got of work this AM from [employer]. He is doing very well on his current regimen. He has been very motivated lately to get his VL undetectable for be considered for a kidney transplant. His VL has dropped to 42 about 3 wks ago. I stated that we could wait until the visit with Dr. Tommy Wyatt in July to recheck the labs but he would like to do it today to see if it's undetectable yet. Otherwise, I have never seen him this motivated before. His mom will accompany him to the next visit.  Recommendations:  HIV labs today Cont DTG/Descovy F/u with Dr. Tommy Wyatt in July  Onnie Boer, PharmD, BCPS, AAHIVP, [place] [facility] Infectious [facility] 08/14/2016, 9:31 AM

## 2016-08-15 LAB — T-HELPER CELL (CD4) - (RCID CLINIC ONLY)
CD4 % Helper T Cell: 8 % — ABNORMAL LOW (ref 33–55)
CD4 T Cell Abs: 90 /uL — ABNORMAL LOW (ref 400–2700)

## 2016-08-17 LAB — HIV-1 RNA QUANT-NO REFLEX-BLD
HIV 1 RNA Quant: 79 copies/mL — ABNORMAL HIGH
HIV-1 RNA Quant, Log: 1.9 Log copies/mL — ABNORMAL HIGH

## 2016-08-19 ENCOUNTER — Emergency Department (HOSPITAL_COMMUNITY): Payer: Medicare Other

## 2016-08-19 ENCOUNTER — Inpatient Hospital Stay (HOSPITAL_COMMUNITY)
Admission: EM | Admit: 2016-08-19 | Discharge: 2016-08-23 | DRG: 640 | Disposition: A | Discharge status: Home / Self Care | Payer: Medicare Other | Attending: Oncology | Admitting: Oncology

## 2016-08-19 DIAGNOSIS — G40909 Epilepsy, unspecified, not intractable, without status epilepticus: Secondary | ICD-10-CM | POA: Diagnosis present

## 2016-08-19 DIAGNOSIS — Z79899 Other long term (current) drug therapy: Secondary | ICD-10-CM | POA: Diagnosis not present

## 2016-08-19 DIAGNOSIS — H5509 Other forms of nystagmus: Secondary | ICD-10-CM | POA: Diagnosis present

## 2016-08-19 DIAGNOSIS — Z881 Allergy status to other antibiotic agents status: Secondary | ICD-10-CM | POA: Diagnosis not present

## 2016-08-19 DIAGNOSIS — I071 Rheumatic tricuspid insufficiency: Secondary | ICD-10-CM | POA: Diagnosis present

## 2016-08-19 DIAGNOSIS — I120 Hypertensive chronic kidney disease with stage 5 chronic kidney disease or end stage renal disease: Secondary | ICD-10-CM | POA: Diagnosis not present

## 2016-08-19 DIAGNOSIS — Z87441 Personal history of nephrotic syndrome: Secondary | ICD-10-CM

## 2016-08-19 DIAGNOSIS — Z8701 Personal history of pneumonia (recurrent): Secondary | ICD-10-CM | POA: Diagnosis not present

## 2016-08-19 DIAGNOSIS — I10 Essential (primary) hypertension: Secondary | ICD-10-CM | POA: Diagnosis present

## 2016-08-19 DIAGNOSIS — Z86718 Personal history of other venous thrombosis and embolism: Secondary | ICD-10-CM

## 2016-08-19 DIAGNOSIS — B20 Human immunodeficiency virus [HIV] disease: Secondary | ICD-10-CM | POA: Diagnosis present

## 2016-08-19 DIAGNOSIS — N186 End stage renal disease: Secondary | ICD-10-CM | POA: Diagnosis not present

## 2016-08-19 DIAGNOSIS — D631 Anemia in chronic kidney disease: Secondary | ICD-10-CM | POA: Diagnosis present

## 2016-08-19 DIAGNOSIS — Z91018 Allergy to other foods: Secondary | ICD-10-CM | POA: Diagnosis not present

## 2016-08-19 DIAGNOSIS — J810 Acute pulmonary edema: Secondary | ICD-10-CM | POA: Diagnosis not present

## 2016-08-19 DIAGNOSIS — Z888 Allergy status to other drugs, medicaments and biological substances status: Secondary | ICD-10-CM | POA: Diagnosis not present

## 2016-08-19 DIAGNOSIS — G4733 Obstructive sleep apnea (adult) (pediatric): Secondary | ICD-10-CM | POA: Diagnosis present

## 2016-08-19 DIAGNOSIS — R51 Headache: Secondary | ICD-10-CM | POA: Diagnosis present

## 2016-08-19 DIAGNOSIS — Z992 Dependence on renal dialysis: Secondary | ICD-10-CM | POA: Diagnosis not present

## 2016-08-19 DIAGNOSIS — E8770 Fluid overload, unspecified: Secondary | ICD-10-CM | POA: Diagnosis not present

## 2016-08-19 DIAGNOSIS — I110 Hypertensive heart disease with heart failure: Secondary | ICD-10-CM | POA: Diagnosis present

## 2016-08-19 DIAGNOSIS — I5032 Chronic diastolic (congestive) heart failure: Secondary | ICD-10-CM | POA: Diagnosis present

## 2016-08-19 DIAGNOSIS — N2581 Secondary hyperparathyroidism of renal origin: Secondary | ICD-10-CM | POA: Diagnosis not present

## 2016-08-19 DIAGNOSIS — Z8249 Family history of ischemic heart disease and other diseases of the circulatory system: Secondary | ICD-10-CM

## 2016-08-19 DIAGNOSIS — Z8042 Family history of malignant neoplasm of prostate: Secondary | ICD-10-CM | POA: Diagnosis not present

## 2016-08-19 DIAGNOSIS — M898X9 Other specified disorders of bone, unspecified site: Secondary | ICD-10-CM | POA: Diagnosis present

## 2016-08-19 DIAGNOSIS — R000 Tachycardia, unspecified: Secondary | ICD-10-CM | POA: Diagnosis present

## 2016-08-19 DIAGNOSIS — R0602 Shortness of breath: Secondary | ICD-10-CM | POA: Diagnosis not present

## 2016-08-19 LAB — COMPREHENSIVE METABOLIC PANEL
ALT: 10 U/L — ABNORMAL LOW (ref 17–63)
AST: 22 U/L (ref 15–41)
Albumin: 3 g/dL — ABNORMAL LOW (ref 3.5–5.0)
Alkaline Phosphatase: 52 U/L (ref 38–126)
Anion gap: 9 (ref 5–15)
BUN: 22 mg/dL — ABNORMAL HIGH (ref 6–20)
CO2: 32 mmol/L (ref 22–32)
Calcium: 9 mg/dL (ref 8.9–10.3)
Chloride: 97 mmol/L — ABNORMAL LOW (ref 101–111)
Creatinine, Ser: 6.85 mg/dL — ABNORMAL HIGH (ref 0.61–1.24)
GFR calc Af Amer: 11 mL/min — ABNORMAL LOW (ref >60)
GFR calc non Af Amer: 10 mL/min — ABNORMAL LOW (ref >60)
Glucose, Bld: 85 mg/dL (ref 65–99)
Potassium: 4 mmol/L (ref 3.5–5.1)
Sodium: 138 mmol/L (ref 135–145)
Total Bilirubin: 1 mg/dL (ref 0.3–1.2)
Total Protein: 7.6 g/dL (ref 6.5–8.1)

## 2016-08-19 LAB — CBC WITH DIFFERENTIAL/PLATELET
Basophils Absolute: 0.1 10*3/uL (ref 0.0–0.1)
Basophils Relative: 1 %
Eosinophils Absolute: 0.3 10*3/uL (ref 0.0–0.7)
Eosinophils Relative: 5 %
HCT: 25 % — ABNORMAL LOW (ref 39.0–52.0)
Hemoglobin: 7.7 g/dL — ABNORMAL LOW (ref 13.0–17.0)
Lymphocytes Relative: 16 %
Lymphs Abs: 0.9 10*3/uL (ref 0.7–4.0)
MCH: 29.5 pg (ref 26.0–34.0)
MCHC: 30.8 g/dL (ref 30.0–36.0)
MCV: 95.8 fL (ref 78.0–100.0)
Monocytes Absolute: 0.4 10*3/uL (ref 0.1–1.0)
Monocytes Relative: 8 %
Neutro Abs: 3.8 10*3/uL (ref 1.7–7.7)
Neutrophils Relative %: 70 %
Platelets: 174 10*3/uL (ref 150–400)
RBC: 2.61 MIL/uL — ABNORMAL LOW (ref 4.22–5.81)
RDW: 17 % — ABNORMAL HIGH (ref 11.5–15.5)
WBC: 5.4 10*3/uL (ref 4.0–10.5)

## 2016-08-19 LAB — MAGNESIUM: Magnesium: 2.1 mg/dL (ref 1.7–2.4)

## 2016-08-19 LAB — I-STAT TROPONIN, ED: Troponin i, poc: 0.09 ng/mL (ref 0.00–0.08)

## 2016-08-19 LAB — I-STAT CG4 LACTIC ACID, ED
Lactic Acid, Venous: 0.68 mmol/L (ref 0.5–1.9)
Lactic Acid, Venous: 0.63 mmol/L (ref 0.5–1.9)

## 2016-08-19 LAB — BRAIN NATRIURETIC PEPTIDE: B Natriuretic Peptide: 2378.2 pg/mL — ABNORMAL HIGH (ref 0.0–100.0)

## 2016-08-19 MED ORDER — DIPHENHYDRAMINE HCL 25 MG PO CAPS: 25.0000 mg | ORAL_CAPSULE | Freq: Four times a day (QID) | ORAL | Status: DC | PRN

## 2016-08-19 MED ORDER — LIDOCAINE-PRILOCAINE 2.5-2.5 % EX CREA: 1.0000 "application " | TOPICAL_CREAM | CUTANEOUS | Status: DC | PRN

## 2016-08-19 MED ORDER — PENTAFLUOROPROP-TETRAFLUOROETH EX AERO: 1.0000 "application " | INHALATION_SPRAY | CUTANEOUS | Status: DC | PRN

## 2016-08-19 MED ORDER — LABETALOL HCL 5 MG/ML IV SOLN
20.0000 mg | Freq: Once | INTRAVENOUS | Status: DC
  Filled 2016-08-19: qty 4

## 2016-08-19 MED ORDER — EMTRICITABINE-TENOFOVIR AF 200-25 MG PO TABS
1.0000 | ORAL_TABLET | Freq: Every day | ORAL | Status: DC
  Administered 2016-08-20 – 2016-08-23 (×4): 1 via ORAL
  Filled 2016-08-19 (×5): qty 1

## 2016-08-19 MED ORDER — SODIUM CHLORIDE 0.9% FLUSH: 3.0000 mL | INTRAVENOUS | Status: DC | PRN

## 2016-08-19 MED ORDER — ACETAMINOPHEN 325 MG PO TABS
650.0000 mg | ORAL_TABLET | Freq: Four times a day (QID) | ORAL | Status: DC | PRN
  Administered 2016-08-20 (×2): 650 mg via ORAL
  Filled 2016-08-19 (×3): qty 2

## 2016-08-19 MED ORDER — HEPARIN SODIUM (PORCINE) 5000 UNIT/ML IJ SOLN
5000.0000 [IU] | Freq: Three times a day (TID) | INTRAMUSCULAR | Status: DC
Start: 2016-08-19 — End: 2016-08-23
  Filled 2016-08-19 (×4): qty 1

## 2016-08-19 MED ORDER — SODIUM CHLORIDE 0.9% FLUSH
3.0000 mL | Freq: Two times a day (BID) | INTRAVENOUS | Status: DC
  Administered 2016-08-19 – 2016-08-23 (×7): 3 mL via INTRAVENOUS

## 2016-08-19 MED ORDER — SODIUM CHLORIDE 0.9% FLUSH
3.0000 mL | Freq: Two times a day (BID) | INTRAVENOUS | Status: DC
  Administered 2016-08-20 – 2016-08-21 (×2): 3 mL via INTRAVENOUS

## 2016-08-19 MED ORDER — TOPIRAMATE 100 MG PO TABS
50.0000 mg | ORAL_TABLET | Freq: Two times a day (BID) | ORAL | Status: DC
  Administered 2016-08-20 – 2016-08-23 (×6): 50 mg via ORAL
  Filled 2016-08-19: qty 2
  Filled 2016-08-19: qty 1
  Filled 2016-08-19 (×3): qty 2
  Filled 2016-08-19: qty 1
  Filled 2016-08-19: qty 2
  Filled 2016-08-19: qty 1

## 2016-08-19 MED ORDER — TRIAMCINOLONE ACETONIDE 0.5 % EX OINT
1.0000 "application " | TOPICAL_OINTMENT | Freq: Two times a day (BID) | CUTANEOUS | Status: DC
  Administered 2016-08-20: 1 via TOPICAL
  Filled 2016-08-19: qty 15

## 2016-08-19 MED ORDER — SODIUM CHLORIDE 0.9 % IV SOLN: 100.0000 mL | INTRAVENOUS | Status: DC | PRN

## 2016-08-19 MED ORDER — FAMOTIDINE 20 MG PO TABS
20.0000 mg | ORAL_TABLET | Freq: Every day | ORAL | Status: DC
  Filled 2016-08-19 (×4): qty 1

## 2016-08-19 MED ORDER — HYDRALAZINE HCL 20 MG/ML IJ SOLN
10.0000 mg | Freq: Once | INTRAMUSCULAR | Status: AC
  Administered 2016-08-19: 10 mg via INTRAVENOUS
  Filled 2016-08-19: qty 1

## 2016-08-19 MED ORDER — SODIUM CHLORIDE 0.9 % IV SOLN: 250.0000 mL | INTRAVENOUS | Status: DC | PRN

## 2016-08-19 MED ORDER — LEVETIRACETAM 250 MG PO TABS
250.0000 mg | ORAL_TABLET | ORAL | Status: DC
  Administered 2016-08-22: 250 mg via ORAL
  Filled 2016-08-19: qty 1

## 2016-08-19 MED ORDER — HYDROXYZINE HCL 25 MG PO TABS: 25.0000 mg | ORAL_TABLET | Freq: Three times a day (TID) | ORAL | Status: DC | PRN

## 2016-08-19 MED ORDER — HYDRALAZINE HCL 50 MG PO TABS
100.0000 mg | ORAL_TABLET | Freq: Three times a day (TID) | ORAL | Status: DC
  Administered 2016-08-19 – 2016-08-22 (×7): 100 mg via ORAL
  Filled 2016-08-19 (×8): qty 2

## 2016-08-19 MED ORDER — ISOSORBIDE MONONITRATE ER 60 MG PO TB24
60.0000 mg | ORAL_TABLET | Freq: Every day | ORAL | Status: DC
  Administered 2016-08-20 – 2016-08-23 (×3): 60 mg via ORAL
  Filled 2016-08-19: qty 2
  Filled 2016-08-19 (×2): qty 1
  Filled 2016-08-19: qty 2

## 2016-08-19 MED ORDER — DAPSONE 100 MG PO TABS
100.0000 mg | ORAL_TABLET | Freq: Every day | ORAL | Status: DC
  Administered 2016-08-20 – 2016-08-23 (×4): 100 mg via ORAL
  Filled 2016-08-19 (×5): qty 1

## 2016-08-19 MED ORDER — HEPARIN SODIUM (PORCINE) 1000 UNIT/ML DIALYSIS: 1000.0000 [IU] | INTRAMUSCULAR | Status: DC | PRN

## 2016-08-19 MED ORDER — CLONIDINE HCL 0.2 MG/24HR TD PTWK
0.2000 mg | MEDICATED_PATCH | TRANSDERMAL | Status: DC
  Administered 2016-08-20: 0.2 mg via TRANSDERMAL
  Filled 2016-08-19: qty 1

## 2016-08-19 MED ORDER — CLONIDINE HCL 0.2 MG/24HR TD PTWK
0.2000 mg | MEDICATED_PATCH | TRANSDERMAL | Status: DC
  Filled 2016-08-19: qty 1

## 2016-08-19 MED ORDER — LOSARTAN POTASSIUM 50 MG PO TABS
100.0000 mg | ORAL_TABLET | Freq: Every day | ORAL | Status: DC
  Administered 2016-08-20 – 2016-08-22 (×3): 100 mg via ORAL
  Filled 2016-08-19 (×3): qty 2

## 2016-08-19 MED ORDER — ALTEPLASE 2 MG IJ SOLR: 2.0000 mg | Freq: Once | INTRAMUSCULAR | Status: DC | PRN

## 2016-08-19 MED ORDER — LEVETIRACETAM 250 MG PO TABS: 250.0000 mg | ORAL_TABLET | ORAL | Status: DC

## 2016-08-19 MED ORDER — GABAPENTIN 100 MG PO CAPS
100.0000 mg | ORAL_CAPSULE | Freq: Three times a day (TID) | ORAL | Status: DC
  Administered 2016-08-20 – 2016-08-23 (×6): 100 mg via ORAL
  Filled 2016-08-19 (×9): qty 1

## 2016-08-19 MED ORDER — ACETAMINOPHEN 650 MG RE SUPP
650.0000 mg | Freq: Four times a day (QID) | RECTAL | Status: DC | PRN
Start: 2016-08-19 — End: 2016-08-23

## 2016-08-19 MED ORDER — VENLAFAXINE HCL ER 150 MG PO CP24
150.0000 mg | ORAL_CAPSULE | Freq: Every day | ORAL | Status: DC
  Filled 2016-08-19 (×4): qty 1

## 2016-08-19 MED ORDER — LEVETIRACETAM 500 MG PO TABS
500.0000 mg | ORAL_TABLET | Freq: Every day | ORAL | Status: DC
  Administered 2016-08-20 – 2016-08-23 (×4): 500 mg via ORAL
  Filled 2016-08-19 (×4): qty 1

## 2016-08-19 MED ORDER — LIDOCAINE HCL (PF) 1 % IJ SOLN: 5.0000 mL | INTRAMUSCULAR | Status: DC | PRN

## 2016-08-19 MED ORDER — SALINE SPRAY 0.65 % NA SOLN
1.0000 | NASAL | Status: DC | PRN
  Administered 2016-08-20: 1 via NASAL
  Filled 2016-08-19: qty 44

## 2016-08-19 MED ORDER — DOLUTEGRAVIR SODIUM 50 MG PO TABS
50.0000 mg | ORAL_TABLET | Freq: Every day | ORAL | Status: DC
  Administered 2016-08-20 – 2016-08-23 (×4): 50 mg via ORAL
  Filled 2016-08-19 (×4): qty 1

## 2016-08-19 MED ORDER — FLUTICASONE PROPIONATE 50 MCG/ACT NA SUSP
2.0000 | Freq: Every day | NASAL | Status: DC
  Filled 2016-08-19: qty 16

## 2016-08-19 NOTE — ED Provider Notes (Signed)
[facility] DEPT Provider Note   CSN: [id number] Arrival date & time: 08/19/16  1703     History   Chief Complaint Chief Complaint  Patient presents with  . Shortness of Breath    HPI Jim Wyatt is a 36 y.o. male.  HPI  36 yo m with extensive PMHx including ESRD, HTN, h/o Jim Wyatt here with SOB. Pt states that he has felt progressively more SOB x 1 week. He went to dialysis today and this worsened, rather than improved, his SOB. He has felt hot with chills and has been coughing with occasional sputum production. He has had to sleep with multiple pillows due to worsening SOB lying flat as well, but declines any facial, abdomen, or LE swelling. Denies any chest pain. Denies any nausea, vomiting, or diarrhea. He reports a history of Jim Wyatt with similar sx. He is at or below his dry weight.  Past Medical History:  Diagnosis Date  . Asthma   . CAP (community acquired pneumonia) 10/2013  . Cellulitis 11/2013   LLE  . Jim Wyatt (deep venous thrombosis) ([place]) 10/2013   BLE  . ESRD on hemodialysis ([place]) 01/2015   AKI with nephrotic syndrome (16gm proteinuria at time of biopsy, from collapsing FSG). 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  Went on HD Dec 2016.   [name] Fever   . Headache   . 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 Hypertension   . Leg pain   . Multiple environmental allergies    "trees, dogs, cats"  . Multiple food allergies   . Shingles < 2010    Patient Active Problem List   Diagnosis Date Noted  . Volume overload 08/19/2016  . Acne-like skin bumps 07/19/2016  . Neuropathic pain 07/05/2016  . Pruritus 07/05/2016  . Anasarca   . Hypervolemia   . Chronic diarrhea   . ESRD on hemodialysis ([place])   . Acute pulmonary edema (HCC)   . SIRS (systemic inflammatory response syndrome) ([place]) 05/30/2016  . Acute respiratory failure ([place]) 05/30/2016  . 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  Noncompliance   . End stage renal disease ([place])   . Chest pressure   . Palliative care encounter   . Goals of care, counseling/discussion   . Shortness of breath 05/12/2016  . Dyspnea   . Hypertensive emergency   . Pericardial effusion   . Tachycardia   . Jim Wyatt disease ([place])   . OSA (obstructive sleep apnea) 04/19/2016  . Hospital discharge follow-up 04/19/2016  . Depression 03/24/2016  . Breast lump or mass   . Rectal bleeding   . GI bleed 03/15/2016  . Hyperkalemia   . New onset seizure ([place]) 03/13/2016  . Symptomatic anemia 02/01/2016  . Dysuria 01/18/2016  . Hematochezia 01/09/2016  . Postoperative anemia due to acute blood loss 01/09/2016  . Seizure disorder ([place]) 12/29/2015  . Intussusception (Jim Wy[place]) 12/28/2015  . Hypertension 12/28/2015  . Otitis externa of both ears 11/21/2015  . AIDS (acquired immune deficiency syndrome) ([place])   . Meningitis   . Cephalalgia   . Blurry vision, bilateral   . Headache   . Hypertensive urgency   . New onset of headache in immunocompromised patient ([place]) 07/06/2015  . ESRD on dialysis (Jim Wyatt) 04/27/2015  . CAP (community acquired pneumonia) 02/25/2015  . ESRD (end stage renal disease) (Jim Wyatt) 02/16/2015  . History of Jim Wyatt (deep vein thrombosis) 01/12/2015  . Candidal esophagitis (  HCC)   . Constipation   . Nausea without vomiting 12/20/2014  . Abscess of right thigh 12/19/2014  . Chronic kidney disease (CKD), stage IV (severe) ([place]) 12/19/2014  . 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 Anemia due to end stage renal disease ([place]) 12/19/2014  . Hyponatremia 12/19/2014  . AIDS (Jim Wyatt)   . Community acquired pneumonia 07/30/2014  . Anemia of chronic disease 07/30/2014  . Acute on chronic renal failure ([place])   . 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 CMV (cytomegalovirus infection) ([place]) 12/07/2013  . Protein-calorie malnutrition, severe (Jim Wyatt) 12/06/2013  . Human immunodeficiency virus (Jim Wyatt) disease (Jim Wyatt) 11/27/2013  . 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 Jim Wyatt (deep venous thrombosis) ([place]) 11/24/2013  . Asthma, chronic 11/24/2013  . Acquired immune deficiency syndrome (Jim Wyatt) 11/14/2013  . AKI (acute kidney injury) (Jim Wyatt) 11/11/2013  . Elevated liver enzymes 11/09/2013  . History of shingles 11/09/2013  . Obesity 11/09/2013  . Abdominal pain 11/07/2013  . Asthma     Past Surgical History:  Procedure Laterality Date  . AV FISTULA PLACEMENT Left 03/03/2015   Procedure: Creation of Left Arm RADIOCEPHALIC ARTERIOVENOUS FISTULA ;  Surgeon: Mal Misty, MD;  Location: [facility];  Service: Vascular;  Laterality: Left;  . COLONOSCOPY N/A 03/18/2016   Procedure: COLONOSCOPY;  Surgeon: Manus Gunning, MD;  Location: [facility];  Service: Gastroenterology;  Laterality: N/A;  . FEMUR FRACTURE SURGERY Right 09/2001  . FRACTURE SURGERY    . I&D EXTREMITY Right 12/18/2014   Procedure: IRRIGATION AND DEBRIDEMENT RIGHT LOWER EXTREMITY;  Surgeon: Ralene Ok, MD;  Location: [facility];  Service: General;  Laterality: Right;  . INSERTION OF DIALYSIS CATHETER  2016  . LAPAROTOMY N/A 12/28/2015   Procedure: EXPLORATORY LAPAROTOMY BOWEL RESECTION ILEOCECECTOMY;  Surgeon: Clovis Riley, MD;  Location: [facility];  Service: General;  Laterality: N/A;       Home Medications    Prior to Admission medications   Medication Sig Start Date End Date Taking? Authorizing Provider  acetaminophen (TYLENOL) 500 MG tablet Take 1,000 mg by mouth every 6 (six) hours as needed (pain).   Yes [provider]  albuterol (PROVENTIL HFA;VENTOLIN HFA) 108 (90 Base) MCG/ACT inhaler Inhale 2 puffs into the lungs every 6 (six) hours as needed for wheezing or shortness of breath. 06/22/16  Yes Smiley Houseman, MD  cloNIDine (CATAPRES - DOSED IN MG/24 HR) 0.1 mg/24hr patch Place 0.2 mg onto the skin every Sunday.   Yes [provider]  dapsone 100 MG tablet Take 1 tablet (100 mg total)  by mouth daily. 07/28/16  Yes Tommy Medal, Lavell Islam, MD  diphenhydrAMINE (BENADRYL) 25 mg capsule Take 1 capsule (25 mg total) by mouth every 6 (six) hours as needed. Patient taking differently: Take 50 mg by mouth daily.  08/03/16  Yes Kirichenko, Tatyana, PA-C  dolutegravir (TIVICAY) 50 MG tablet Take 1 tablet (50 mg total) by mouth daily. 06/02/16  Yes Tommy Medal, Lavell Islam, MD  emtricitabine-tenofovir AF (DESCOVY) 200-25 MG tablet Take 1 tablet by mouth daily. 06/02/16  Yes Tommy Medal, Lavell Islam, MD  famotidine (PEPCID) 20 MG tablet Take 1 tablet (20 mg total) by mouth 2 (two) times daily. Patient taking differently: Take 20 mg by mouth 2 (two) times daily as needed for heartburn (acid reflux).  08/03/16  Yes Kirichenko, Tatyana, PA-C  gabapentin (NEURONTIN) 100 MG capsule Take 1 capsule (100 mg total) by mouth 3 (three) times daily. Patient taking differently: Take 100  mg by mouth daily.  07/05/16  Yes [name], MD  hydrALAZINE (APRESOLINE) 100 MG tablet Take 100-200 mg by mouth See admin instructions. Take 1 tablet (100 mg) by mouth three times daily, take 2 tablets (200 mg) for SBP >190   Yes [provider]  hydrOXYzine (ATARAX/VISTARIL) 25 MG tablet Take 1 tablet (25 mg total) by mouth 3 (three) times daily as needed for itching. Patient taking differently: Take 25 mg by mouth 2 (two) times daily.  07/05/16  Yes [name], MD  levETIRAcetam (KEPPRA) 250 MG tablet Take 1 tablet (250 mg) by mouth on Tuesday, Thursday, Saturday at 7pm Patient taking differently: Take 250 mg by mouth See admin instructions. Take one tablet (250 mg) by mouth every Tuesday, Thursday and Saturday at 7 pm 03/13/16  Yes  Sprang, MD  levETIRAcetam (KEPPRA) 500 MG tablet Take 500 mg by mouth daily.   Yes [provider]  losartan (COZAAR) 100 MG tablet Take 100 mg by mouth daily.    Yes [provider]  topiramate (TOPAMAX) 50 MG tablet Take 1 tablet (50 mg total) by mouth 2  (two) times daily. 06/22/16  Yes Smiley Houseman, MD  triamcinolone ointment (KENALOG) 0.5 % Apply 1 application topically 2 (two) times daily. Patient taking differently: Apply 1 application topically 2 (two) times daily as needed (rash/ itching).  07/19/16  Yes [name], MD  cloNIDine (CATAPRES - DOSED IN MG/24 HR) 0.2 mg/24hr patch Place 1 patch (0.2 mg total) onto the skin once a week. Patient taking differently: Place 0.2 mg onto the skin every Sunday.  08/13/16   Charlesetta Shanks, MD  fluticasone (FLONASE) 50 MCG/ACT nasal spray Place 2 sprays into both nostrils daily. Patient not taking: Reported on 08/19/2016 08/02/16   Horton, Barbette Hair, MD  hydrALAZINE (APRESOLINE) 50 MG tablet Take 2 tablets (100 mg total) by mouth 3 (three) times daily. Patient not taking: Reported on 08/19/2016 06/22/16   Smiley Houseman, MD  isosorbide mononitrate (IMDUR) 60 MG 24 hr tablet Take 1 tablet (60 mg total) by mouth daily. Patient not taking: Reported on 08/14/2016 08/13/16   Charlesetta Shanks, MD  pantoprazole (PROTONIX) 40 MG tablet Take 1 tablet (40 mg total) by mouth daily. Patient not taking: Reported on 08/19/2016 06/22/16   [place] Bing, DO  sodium chloride (OCEAN) 0.65 % SOLN nasal spray Place 1 spray into both nostrils as needed for congestion. Patient not taking: Reported on 08/19/2016 08/02/16   Horton, Barbette Hair, MD  sucralfate (CARAFATE) 1 GM/10ML suspension Take 10 mLs (1 g total) by mouth 4 (four) times daily -  with meals and at bedtime. Patient not taking: Reported on 08/14/2016 06/22/16   [place] Bing, DO    Family History Family History  Problem Relation Age of Onset  . Hypertension Mother   . Diabetes Maternal Grandmother   . Kidney disease Maternal Grandmother     Social History Social History  Substance Use Topics  . Smoking status: Never Smoker  . Smokeless tobacco: Never Used  . Alcohol use No     Comment: I stopped drinking along time ago "      Allergies   Bactrim [sulfamethoxazole-trimethoprim]; Onion; Procardia [nifedipine]; Reglan [metoclopramide]; Vancomycin; Metoprolol; Amlodipine; and Augmentin [amoxicillin-pot clavulanate]   Review of Systems Review of Systems  Constitutional: Positive for chills and fatigue.  Respiratory: Positive for cough, shortness of breath and wheezing.   All other systems reviewed and are negative.  Physical Exam Updated Vital Signs BP (!) 182/115 (BP Location: Right Arm)   Pulse (!) 114   Temp 98.4 F (36.9 C) (Oral)   Resp 16   Ht 5\' 10"  (1.778 m)   Wt 73.2 kg (161 lb 4.8 oz)   SpO2 98%   BMI 23.14 kg/m   Physical Exam  Constitutional: He is oriented to person, place, and time. He appears well-developed and well-nourished. He appears distressed (tachypneic, speaking in 1-2 word sentences).  HENT:  Head: Normocephalic and atraumatic.  Eyes: Conjunctivae are normal.  Neck: Neck supple.  Cardiovascular: Normal rate, regular rhythm and normal heart sounds.  Exam reveals no friction rub.   No murmur heard. Pulmonary/Chest: Tachypnea noted. He is in respiratory distress. He has decreased breath sounds. He has no wheezes. He has rhonchi in the right lower field and the left lower field. He has rales in the left lower field.  Abdominal: He exhibits no distension.  Musculoskeletal: He exhibits no edema.  Neurological: He is alert and oriented to person, place, and time. He exhibits normal muscle tone.  Skin: Skin is warm. Capillary refill takes less than 2 seconds.  Psychiatric: He has a normal mood and affect.  Nursing note and vitals reviewed.    ED Treatments / Results  Labs (all labs ordered are listed, but only abnormal results are displayed) Labs Reviewed  CBC WITH DIFFERENTIAL/PLATELET - Abnormal; Notable for the following:       Result Value   RBC 2.61 (*)    Hemoglobin 7.7 (*)    HCT 25.0 (*)    RDW 17.0 (*)    All other components within normal limits   COMPREHENSIVE METABOLIC PANEL - Abnormal; Notable for the following:    Chloride 97 (*)    BUN 22 (*)    Creatinine, Ser 6.85 (*)    Albumin 3.0 (*)    ALT 10 (*)    GFR calc non Af Amer 10 (*)    GFR calc Af Amer 11 (*)    All other components within normal limits  BRAIN NATRIURETIC PEPTIDE - Abnormal; Notable for the following:    B Natriuretic Peptide 2,378.2 (*)    All other components within normal limits  I-STAT TROPOININ, ED - Abnormal; Notable for the following:    Troponin i, poc 0.09 (*)    All other components within normal limits  MRSA PCR SCREENING  MAGNESIUM  CBC  RENAL FUNCTION PANEL  TSH  I-STAT CG4 LACTIC ACID, ED  I-STAT CG4 LACTIC ACID, ED    EKG  EKG Interpretation  Date/Time:  Saturday August 19 2016 17:40:26 EDT Ventricular Rate:  113 PR Interval:    QRS Duration: 97 QT Interval:  359 QTC Calculation: 493 R Axis:   36 Text Interpretation:  Sinus tachycardia Biatrial enlargement LVH with secondary repolarization abnormality Anterior Q waves, possibly due to LVH Prolonged QT interval Baseline wander in lead(s) V6 Since last EKG, TWI is more evidence in lateral precordial leads Confirmed by Duffy Bruce [phone]) on 08/19/2016 5:45:40 PM       Radiology Dg Chest 2 View  Result Date: 08/19/2016 CLINICAL DATA:  Shortness of breath. EXAM: CHEST  2 VIEW COMPARISON:  August 13, 2016 FINDINGS: Cardiomegaly and mild edema.  No other interval changes. IMPRESSION: Cardiomegaly and mild edema. Electronically Signed   By: Dorise Bullion III M.D   On: 08/19/2016 18:42    Procedures Procedures (including critical care time)  Medications Ordered in ED Medications  heparin  injection 5,000 Units (5,000 Units Subcutaneous Not Given 08/19/16 2253)  sodium chloride flush (NS) 0.9 % injection 3 mL (0 mLs Intravenous Duplicate 7/85/88 5027)  sodium chloride flush (NS) 0.9 % injection 3 mL (3 mLs Intravenous Given 08/19/16 2252)  sodium chloride flush (NS) 0.9 %  injection 3 mL (not administered)  0.9 %  sodium chloride infusion (not administered)  acetaminophen (TYLENOL) tablet 650 mg (not administered)    Or  acetaminophen (TYLENOL) suppository 650 mg (not administered)  dapsone tablet 100 mg (100 mg Oral Not Given 08/19/16 2254)  diphenhydrAMINE (BENADRYL) capsule 25 mg (not administered)  dolutegravir (TIVICAY) tablet 50 mg (50 mg Oral Not Given 08/19/16 2254)  emtricitabine-tenofovir AF (DESCOVY) 200-25 MG per tablet 1 tablet (1 tablet Oral Not Given 08/19/16 2254)  famotidine (PEPCID) tablet 20 mg (not administered)  fluticasone (FLONASE) 50 MCG/ACT nasal spray 2 spray (not administered)  gabapentin (NEURONTIN) capsule 100 mg (100 mg Oral Not Given 08/19/16 2253)  hydrALAZINE (APRESOLINE) tablet 100 mg (100 mg Oral Given 08/19/16 2251)  isosorbide mononitrate (IMDUR) 24 hr tablet 60 mg (60 mg Oral Not Given 08/19/16 2253)  losartan (COZAAR) tablet 100 mg (100 mg Oral Not Given 08/19/16 2253)  sodium chloride (OCEAN) 0.65 % nasal spray 1 spray (not administered)  topiramate (TOPAMAX) tablet 50 mg (not administered)  triamcinolone ointment (KENALOG) 0.5 % 1 application (not administered)  venlafaxine XR (EFFEXOR-XR) 24 hr capsule 150 mg (not administered)  pentafluoroprop-tetrafluoroeth (GEBAUERS) aerosol 1 application (not administered)  lidocaine (PF) (XYLOCAINE) 1 % injection 5 mL (not administered)  lidocaine-prilocaine (EMLA) cream 1 application (not administered)  0.9 %  sodium chloride infusion (not administered)  0.9 %  sodium chloride infusion (not administered)  heparin injection 1,000 Units (not administered)  alteplase (CATHFLO ACTIVASE) injection 2 mg (not administered)  cloNIDine (CATAPRES - Dosed in mg/24 hr) patch 0.2 mg (not administered)  levETIRAcetam (KEPPRA) tablet 500 mg (not administered)  levETIRAcetam (KEPPRA) tablet 250 mg (not administered)  hydrALAZINE (APRESOLINE) injection 10 mg (10 mg Intravenous Given 08/19/16 2016)      Initial Impression / Assessment and Plan / ED Course  I have reviewed the triage vital signs and the nursing notes.  Pertinent labs & imaging results that were available during my care of the patient were reviewed by me and considered in my medical decision making (see chart for details).     36 yo M here with acute on chronic SOB, despite reported adherence with outpt dialysis. On arrival, pt hypertensive, tachypneic, with diffuse rales. CXR shows pulm edema. BNP markedly elevated form baseline. Concern for hypervolemia 2/2 ESRD, c/b pulmonary edema. He is o/w stable and at this time, does not appear to require BIPAP with WOB improved with sitting upright. Pt also has acute on chronic anemia, likely 2/2 ESRD. D/w Nephrology. Will admit for serial dialysis, montioring. IV Hydral for HTN.  Final Clinical Impressions(s) / ED Diagnoses   Final diagnoses:  ESRD on dialysis [facility])  Acute pulmonary edema [facility])    New Prescriptions Current Discharge Medication List       Duffy Bruce, MD 08/19/16 2352

## 2016-08-19 NOTE — ED Triage Notes (Signed)
Per ems pt c/o shortness of breath starting today complete dialysis and went home and shortness of breath worsened. Lungs clear bilaterally throughout, denies pain. Pt endorses persistent cough for 1 month endorses chronic weakness and fever today.

## 2016-08-19 NOTE — H&P (Signed)
Date: 08/19/2016               Patient Name:  Jim Wyatt MRN: [id number]  DOB: 1980-03-26 Age / Sex: 36 y.o., male   PCP: [name], MD         Medical Service: Internal Medicine Teaching Service         Attending Physician: Dr. Sid Falcon, MD    First Contact: Dr. Heber Plush Pager: [phone]  Second Contact: Dr. Posey Pronto Pager: [phone]       After Hours (After 5p/  First Contact Pager: [phone]  weekends / holidays): Second Contact Pager: [phone]   Chief Complaint: Shortness of breath.  History of Present Illness: Jim Wyatt is a 36 y.o. 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  According to patient he started becoming short of breath on Wednesday, which mildly improved after Thursday dialysis, get worse again the same night, he had his dialysis done today with no improvement in his shortness of breath. This shortness of breath worsened with lying down, patient normally takes 3 pillows but for past 2 nights he is sitting upright as he cannot breathe while lying down. He denies any exertional component. She denies any chest pain,PND, fever or chills. He does endorse some cough for the last 2 month, mostly dry and no recent change, some sinus congestion which he attributes towards his allergies, and headache. Denies any change in his vision, or nausea or vomiting.  He denies any recent change in his appetite, bowel habits. He denies any bleeding especially in the palm of hemoptysis, hematochezia, melena or hematuria.  According to patient he was having some stinging pain bilaterally In his feet bilaterally, his PCP started him on gabapentin 100 mg 3 times a day which helped.  IN ED, he was found to be tachycardic with heart rate around 110, hypertensive with 188/114, chest x-ray without any significant pulmonary  congestion, bilateral basal crackles and JVD on exam. He was being admitted as volume overload/CHF. Nephrology was consulted in the ED, we will do serial dialysis starting from tomorrow.  Meds:  No outpatient prescriptions have been marked as taking for the 08/19/16 encounter [facility] ([place] Encounter).     Allergies: Allergies as of 08/19/2016 - Review Complete 08/19/2016  Allergen Reaction Noted  . Bactrim [sulfamethoxazole-trimethoprim] Other (See Comments) 11/14/2013  . Procardia [nifedipine] Hives, Shortness Of Breath, and Itching 06/01/2016  . Reglan [metoclopramide] Other (See Comments) 11/21/2015  . Vancomycin Other (See Comments) 12/04/2013  . Metoprolol Hives 06/01/2016  . Amlodipine Palpitations and Other (See Comments) 01/12/2015  . Augmentin [amoxicillin-pot clavulanate] Hives 09/03/2013   Past Medical History:  Diagnosis Date  . Asthma   . CAP (community acquired pneumonia) 10/2013  . Cellulitis 11/2013   LLE  . DVT (deep venous thrombosis) ([place]) 10/2013   BLE  . ESRD on hemodialysis ([place]) 01/2015   AKI with nephrotic syndrome (16gm proteinuria at time of biopsy, from collapsing FSG). 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  Went on HD Dec 2016.   [name] Fever   . Headache   . HIV infection ([place]) dx'd 10/2013  . Hypertension   . Leg pain   . Multiple environmental allergies    "trees, dogs, cats"  . Multiple food allergies   .  Shingles < 2010    Family History: Multiple family members with hypertension, maternal grandmother with diabetes and maternal grandfather with prostate cancer.  Social History: Worked as an Media planner during night shift. Nonsmoker, denies any alcohol or illicit drug use.  Review of Systems: A complete ROS was negative except as per HPI.   Physical Exam: Blood pressure (!) 175/120, pulse (!) 108, temperature 98.6 F (37 C), temperature source Rectal, resp. rate 15, SpO2 98  %. Vitals:   08/19/16 1900 08/19/16 2000 08/19/16 2045 08/19/16 2100  BP: (!) 179/124 (!) 188/114 (!) 180/120 (!) 175/120  Pulse: (!) 116 (!) 112 (!) 110 (!) 108  Resp: (!) 23 (!) 30 (!) 23 15  Temp:      TempSrc:      SpO2: 95% 96% 98% 98%   General: Vital signs reviewed.  Patient is well-developed and well-nourished, in no acute distress and cooperative with exam.  Head: Normocephalic and atraumatic. Eyes: EOMI, conjunctivae normal, no scleral icterus.  Neck: Supple, trachea midline, normal ROM, JVD Up to the lower jaws, no masses, thyromegaly, or carotid bruit present.  Cardiovascular: Sinus tachycardia, S1 normal, S2 normal, no murmurs, gallops, or rubs. Pulmonary/Chest: Bilateral basal crackles extending up to mid zone. Abdominal: Soft, non-tender, mildly distended, BS +, no masses, organomegaly, or guarding present.  Extremities: No lower extremity edema bilaterally,  pulses symmetric and intact bilaterally.No calf tenderness. No cyanosis or clubbing. Neurological: A&O x3, Strength is normal and symmetric bilaterally, cranial nerve II-XII are grossly intact, no focal motor deficit, sensory intact to light touch bilaterally.  Skin: Warm, dry and intact. multiple excoriation marks on his shoulders and upper chest. Psychiatric: Normal mood and affect. speech and behavior is normal. Cognition and memory are normal.  Labs. CBC    Component Value Date/Time   WBC 5.4 08/19/2016 1727   RBC 2.61 (L) 08/19/2016 1727   HGB 7.7 (L) 08/19/2016 1727   HGB 9.5 (L) 03/24/2016 1517   HCT 25.0 (L) 08/19/2016 1727   HCT 30.0 (L) 03/24/2016 1517   PLT 174 08/19/2016 1727   PLT 201 03/24/2016 1517   MCV 95.8 08/19/2016 1727   MCV 96 03/24/2016 1517   MCH 29.5 08/19/2016 1727   MCHC 30.8 08/19/2016 1727   RDW 17.0 (H) 08/19/2016 1727   RDW 16.2 (H) 03/24/2016 1517   LYMPHSABS 0.9 08/19/2016 1727   MONOABS 0.4 08/19/2016 1727   EOSABS 0.3 08/19/2016 1727   BASOSABS 0.1 08/19/2016 1727    CMP Latest Ref Rng & Units 08/19/2016 08/13/2016 06/22/2016  Glucose 65 - 99 mg/dL 85 84 77  BUN 6 - 20 mg/dL 22(H) 53(H) 55(H)  Creatinine 0.61 - 1.24 mg/dL 6.85(H) 8.66(H) 10.51(H)  Sodium 135 - 145 mmol/L 138 135 130(L)  Potassium 3.5 - 5.1 mmol/L 4.0 4.1 4.0  Chloride 101 - 111 mmol/L 97(L) 95(L) 94(L)  CO2 22 - 32 mmol/L 32 28 23  Calcium 8.9 - 10.3 mg/dL 9.0 9.1 9.2  Total Protein 6.5 - 8.1 g/dL 7.6 - -  Total Bilirubin 0.3 - 1.2 mg/dL 1.0 - -  Alkaline Phos 38 - 126 U/L 52 - -  AST 15 - 41 U/L 22 - -  ALT 17 - 63 U/L 10(L) - -   BNP. 2378.2 Troponin. 0.09 Lactic acid.[name] 0.68>>0.63  EKG: Sinus tachycardia with LVH, T-wave inversion in lead V5 and 6 ,QTC at 493. No acute change as compared to previous tracing.  CXR: FINDINGS: Cardiomegaly and mild edema.  No other interval  changes.  IMPRESSION: Cardiomegaly and mild edema.   Assessment & Plan by Problem: SINA LUCCHESI is a 36 y.o. 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  Volume overloaded/HFpEF. Clinically patient does not appear volume overload, he is little below his dry weight of 74 KG, his current weight is 73 KG. An echo done in March 2018 shows normal systolic function with grade 1 diastolic dysfunction and moderate amount of pericardial effusion. EKG shows left ventricular hypertrophy, and chest x-ray shows cardiomegaly. Mildly elevated troponin with BNP above 2000.  His well's score for PE was 3.0 because of tachycardia and previous history of DVT, which makes him at moderate risk. CTA done in March and April 2018 was negative for any PE been admitted with similar complaints and his symptoms improved with serial dialysis.  Nephrology is following and they will start serial dialysis from tomorrow. -Admit to telemetry. -Repeat  echocardiogram. -Serial dialysis-as advised by nephrology. -Heart failure consultation-can be decided by the primary team in the morning.  Uncontrolled hypertension. Patient has a long-standing history of uncontrolled hypertension, according to patient whenever he checked his blood pressure at home his systolic is in 476L and diastolic is in the low 465K. He has an history of being noncompliant in the past, according to him he is religiously taking all of his medications. He has history of allergies to beta blockers and calcium channel blockers, had bad hives with them. He was on clonidine 0.2 mg patch, losartan 100 mg daily and hydralazine 100 mg 3 times a day. -Continue his current management.  Possible Seizure like activity. He has an history of 1  witnessed tonic-clonic seizure last year in September 2017, EEG done in November 2017 was normal. CT head was normal. 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 His CSF culture shows no growth and was negative for crypto antigen, HSV PCR, JC virus. According to neurology note in January 2018, they advised him to continue Keppra, patient was very resistant and stating that he do not want to take Donald as it makes him mean and he also refused any alternative. He was also advised against driving, which patient also declined. According to him he is taking his Keppra regularly, did not had any more seizure-like activity. -Continue Keppra 500 mg daily with an additional dose of 250 mg on his dialysis days.  Obstructive sleep apnea. According to a sleep study done in March 2018, patient is having severe obstructive sleep apnea with central apnea and was recommended BiPAP. According to patient he was never told or prescribed BiPAP. -BiPAP at night. -We will try to arrange  BiPAP at home on discharge.  HIV. Initially patient was noncompliant with his HIV medication, now for the past year he is taking it religiously and follow-up with HIV clinic. His last CD count done on 08/14/2016 was 90 with a viral load of 79. -Continue TIVICAY and DESCOVY. -Continue dapsone.  Anemia. His hemoglobin decreased from 8.1-7.7 in 1 week. In the past patient is thus refusing EPO and iron supplement. He had positive FOBT in April 2018, and was recommended for outpatient GI workup. According to patient he was never told or  given a follow-up appointment. He denies any bleeding currently. -Nephrology is now recommending an Aranesp. -He will need a GI workup as an outpatient. -Monitor CBC. -Can repeat FOBT.  History of DVT. He has an history of DVT in 2015 after prolonged hospitalization, during that time he refused DVT prophylaxis, later developed bleeding with heparin. He was discharged on Lovenox with bridge Coumadin. Patient used Coumadin for a few months. Not on any anticoagulation since then. He denies any more episode of DVT or PE.  CODE STATUS. Full Diet. Renal DVT prophylaxis. Heparin   Dispo: Admit patient to Inpatient with expected length of stay greater than 2 midnights.  SignedLorella Nimrod, MD 08/19/2016, 9:48 PM  Pager: [phone]

## 2016-08-19 NOTE — Consult Note (Signed)
Reason for Consult: To manage dialysis and dialysis related needs Referring Physician: ED/ Dr. Jaquita Rector is an 37 y.o. male.   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 Ellender Hose for evaluation and recommendations surrounding ESRD and provision of HD.  Pt presents with SOB.  He reports that he has been running full dialysis treatments and has been getting at or below his EDW.  He noticed some increased SOB Wednesday especially after he laid down which he thought would resolve after dialysis.  However, he noted that he was SOB even after running his full treatment today so he came to the ED.  Review of OP dialysis records indicate that pt has been staying his full treatment time recently. He left 0.7 kg below his EDW today.  His SBP was in the 200s when he arrived and was in the 190s when he left.  Hgb was 9.2 on 6/14 and was 7.8 6/21.  He denies overt GI losses.   Dialyzes at [facility] TTS 3 hr 45 min   EDW 74 kg. HD Bath 2K 2 Ca  Dialyzer F180, Heparin none. Access L forearm AVF Mircera 150 mcg q 2 weeks to start 6/21, ? Looks like pt refused it due to allergic reaction on 6/6; notes from HD unit say pt will accept Aranesp No venofer or hectorol  Past Medical History:  Diagnosis Date  . Asthma   . CAP (community acquired pneumonia) 10/2013  . Cellulitis 11/2013   LLE  . DVT (deep venous thrombosis) ([place]) 10/2013   BLE  . ESRD on hemodialysis ([place]) 01/2015   AKI with nephrotic syndrome (16gm proteinuria at time of biopsy, from collapsing FSG). 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  Went on HD Dec 2016.   [name] Fever   . Headache   . HIV infection ([place]) dx'd 10/2013  . Hypertension   . Leg pain   . Multiple environmental allergies    "trees, dogs, cats"  . Multiple food allergies   . Shingles < 2010    Past Surgical History:  Procedure Laterality Date  . AV FISTULA PLACEMENT Left 03/03/2015   Procedure: Creation of Left Arm RADIOCEPHALIC ARTERIOVENOUS FISTULA ;  Surgeon: Mal Misty, MD;  Location: [facility];  Service: Vascular;  Laterality: Left;  . COLONOSCOPY N/A 03/18/2016   Procedure: COLONOSCOPY;  Surgeon: Manus Gunning, MD;  Location: [facility];  Service: Gastroenterology;  Laterality: N/A;  . FEMUR FRACTURE SURGERY Right 09/2001  . FRACTURE SURGERY    . I&D EXTREMITY Right 12/18/2014   Procedure: IRRIGATION AND DEBRIDEMENT RIGHT LOWER EXTREMITY;  Surgeon: Ralene Ok, MD;  Location: [facility];  Service: General;  Laterality: Right;  . INSERTION OF DIALYSIS CATHETER  2016  . LAPAROTOMY N/A 12/28/2015   Procedure: EXPLORATORY LAPAROTOMY BOWEL RESECTION ILEOCECECTOMY;  Surgeon: Clovis Riley, MD;  Location: [place];  Service: General;  Laterality: N/A;    Family History  Problem Relation Age of Onset  . Hypertension Mother   . Diabetes Maternal Grandmother   . Kidney disease Maternal Grandmother     Social History:  reports that he has never smoked. He has never used smokeless tobacco. He reports that he does not drink alcohol or use drugs.  Allergies:  Allergies  Allergen Reactions  . Bactrim [Sulfamethoxazole-Trimethoprim] Other (See Comments)    Renal failure  . Procardia [Nifedipine] Hives, Shortness Of Breath and Itching  . Reglan [Metoclopramide] Other (See Comments)    Causes ticks/ jerks  . Vancomycin Other (See Comments)    Patient states vancomycin caused kidney injury  . Metoprolol Hives  . Amlodipine Palpitations and Other (See Comments)    Causes fatigue  . Augmentin [Amoxicillin-Pot Clavulanate] Hives    Medications: I have reviewed the patient's current medications.   Results for orders placed or performed during the hospital encounter of 08/19/16 (from the past 48  hour(s))  CBC with Differential     Status: Abnormal   Collection Time: 08/19/16  5:27 PM  Result Value Ref Range   WBC 5.4 4.0 - 10.5 K/uL   RBC 2.61 (L) 4.22 - 5.81 MIL/uL   Hemoglobin 7.7 (L) 13.0 - 17.0 g/dL   HCT 25.0 (L) 39.0 - 52.0 %   MCV 95.8 78.0 - 100.0 fL   MCH 29.5 26.0 - 34.0 pg   MCHC 30.8 30.0 - 36.0 g/dL   RDW 17.0 (H) 11.5 - 15.5 %   Platelets 174 150 - 400 K/uL   Neutrophils Relative % 70 %   Neutro Abs 3.8 1.7 - 7.7 K/uL   Lymphocytes Relative 16 %   Lymphs Abs 0.9 0.7 - 4.0 K/uL   Monocytes Relative 8 %   Monocytes Absolute 0.4 0.1 - 1.0 K/uL   Eosinophils Relative 5 %   Eosinophils Absolute 0.3 0.0 - 0.7 K/uL   Basophils Relative 1 %   Basophils Absolute 0.1 0.0 - 0.1 K/uL  Comprehensive metabolic panel     Status: Abnormal   Collection Time: 08/19/16  5:27 PM  Result Value Ref Range   Sodium 138 135 - 145 mmol/L   Potassium 4.0 3.5 - 5.1 mmol/L   Chloride 97 (L) 101 - 111 mmol/L   CO2 32 22 - 32 mmol/L   Glucose, Bld 85 65 - 99 mg/dL   BUN 22 (H) 6 - 20 mg/dL   Creatinine, Ser 6.85 (H) 0.61 - 1.24 mg/dL   Calcium 9.0 8.9 - 10.3 mg/dL   Total Protein 7.6 6.5 - 8.1 g/dL   Albumin 3.0 (L) 3.5 - 5.0 g/dL   AST 22 15 - 41 U/L   ALT 10 (L) 17 - 63 U/L   Alkaline Phosphatase 52 38 - 126 U/L   Total Bilirubin 1.0 0.3 - 1.2 mg/dL   GFR calc non Af Amer 10 (L) >60 mL/min   GFR calc Af Amer 11 (L) >60 mL/min    Comment: (NOTE) The eGFR has been calculated using the CKD EPI equation. This calculation has not been validated in all clinical situations. eGFR's persistently <60 mL/min signify possible Chronic Kidney Disease.    Anion gap 9 5 - 15  Brain natriuretic peptide     Status: Abnormal   Collection Time: 08/19/16  5:27 PM  Result Value Ref Range   B Natriuretic Peptide 2,378.2 (H) 0.0 - 100.0 pg/mL  I-Stat Troponin, ED (not at [facility])     Status: Abnormal   Collection Time: 08/19/16  5:37 PM  Result Value Ref Range   Troponin i, poc 0.09 (HH) 0.00 -  0.08 ng/mL   Comment NOTIFIED PHYSICIAN    Comment 3            Comment: Due to the release kinetics of cTnI, a negative result within the  first hours of the onset of symptoms does not rule out myocardial infarction with certainty. If myocardial infarction is still suspected, repeat the test at appropriate intervals.   I-Stat CG4 Lactic Acid, ED     Status: None   Collection Time: 08/19/16  5:40 PM  Result Value Ref Range   Lactic Acid, Venous 0.68 0.5 - 1.9 mmol/L  Magnesium     Status: None   Collection Time: 08/19/16  7:42 PM  Result Value Ref Range   Magnesium 2.1 1.7 - 2.4 mg/dL  I-Stat CG4 Lactic Acid, ED     Status: None   Collection Time: 08/19/16  8:08 PM  Result Value Ref Range   Lactic Acid, Venous 0.63 0.5 - 1.9 mmol/L    Dg Chest 2 View  Result Date: 08/19/2016 CLINICAL DATA:  Shortness of breath. EXAM: CHEST  2 VIEW COMPARISON:  August 13, 2016 FINDINGS: Cardiomegaly and mild edema.  No other interval changes. IMPRESSION: Cardiomegaly and mild edema. Electronically Signed   By: Dorise Bullion III M.D   On: 08/19/2016 18:42    ROS: all other systems reviewed and are negative except as per HPI Blood pressure (!) 175/120, pulse (!) 108, temperature 98.6 F (37 C), temperature source Rectal, resp. rate 15, SpO2 98 %. .  GEN NAD, sitting in up in bed,nad HEENT eomi, perrl sclera non-injected NECK JVD to angle of the mandible PULM mildly increased WOB, bilateral inspiratory crackles 2/3 up lung fields CV tachycardic, + S3 ABD nontender EXT no LE edema NEURO nonfocal ACCESS: L forearm AVF with + bruit and thrill SKIN: nodular lesions back and some on chest  Assessment/Plan: 1 SOB: CXR with increased vascular congestion and pleural effusions.  Likely primary driver.  Dont' suspect sepsis at this point.  He needs an EDW challenge and serial HD treatments. 2 ESRD: TTS at [facility].  He has been attending treatments recently.  We will plan for serial treatments; will  perform sequential tomorrow and perhaps an extra session of HD on Monday before going back to normal TTS schedule.   3 Hypertension/ Volume: expect some BP issues to improve with better volume status.  He says that he wants to come off all BP meds and serially add them back.  I'm not sure that's the best idea given that his BP is so high already.  But- he potentially could come off some meds if vol status gets better. 4. Anemia of ESRD: Has been refusing ESA and iron.  ? Perhaps he will take aranesp as noted in OP records.  Will need at least 200 mcg q weekly 5. Metabolic Bone Disease: Phos 5.9 on 6/23; no hectorol, sensipar.  Takes renvela packets 6.  Nutrition: albumin 3.0, needs to increase protein 7.  HIV/AIDS: recently had PJP pneumonia.  On Descovy and Tivicay. Per primary 8.  Seizure disorder: on Keppra.  Per primary.  Madelon Lips 08/19/2016, 9:28 PM

## 2016-08-20 ENCOUNTER — Encounter (HOSPITAL_COMMUNITY): Payer: Self-pay | Admitting: *Deleted

## 2016-08-20 ENCOUNTER — Inpatient Hospital Stay (HOSPITAL_COMMUNITY): Payer: Medicare Other

## 2016-08-20 DIAGNOSIS — I360 Nonrheumatic tricuspid (valve) stenosis: Secondary | ICD-10-CM

## 2016-08-20 DIAGNOSIS — I5032 Chronic diastolic (congestive) heart failure: Secondary | ICD-10-CM

## 2016-08-20 DIAGNOSIS — I132 Hypertensive heart and chronic kidney disease with heart failure and with stage 5 chronic kidney disease, or end stage renal disease: Secondary | ICD-10-CM

## 2016-08-20 DIAGNOSIS — N186 End stage renal disease: Secondary | ICD-10-CM

## 2016-08-20 LAB — RENAL FUNCTION PANEL
Albumin: 3.1 g/dL — ABNORMAL LOW (ref 3.5–5.0)
Anion gap: 10 (ref 5–15)
BUN: 28 mg/dL — ABNORMAL HIGH (ref 6–20)
CO2: 30 mmol/L (ref 22–32)
Calcium: 9.5 mg/dL (ref 8.9–10.3)
Chloride: 96 mmol/L — ABNORMAL LOW (ref 101–111)
Creatinine, Ser: 8.56 mg/dL — ABNORMAL HIGH (ref 0.61–1.24)
GFR calc Af Amer: 8 mL/min — ABNORMAL LOW (ref >60)
GFR calc non Af Amer: 7 mL/min — ABNORMAL LOW (ref >60)
Glucose, Bld: 91 mg/dL (ref 65–99)
Phosphorus: 5.8 mg/dL — ABNORMAL HIGH (ref 2.5–4.6)
Potassium: 3.8 mmol/L (ref 3.5–5.1)
Sodium: 136 mmol/L (ref 135–145)

## 2016-08-20 LAB — CBC
HCT: 26.9 % — ABNORMAL LOW (ref 39.0–52.0)
Hemoglobin: 8.1 g/dL — ABNORMAL LOW (ref 13.0–17.0)
MCH: 29.3 pg (ref 26.0–34.0)
MCHC: 30.1 g/dL (ref 30.0–36.0)
MCV: 97.5 fL (ref 78.0–100.0)
Platelets: 170 10*3/uL (ref 150–400)
RBC: 2.76 MIL/uL — ABNORMAL LOW (ref 4.22–5.81)
RDW: 16.9 % — ABNORMAL HIGH (ref 11.5–15.5)
WBC: 6 10*3/uL (ref 4.0–10.5)

## 2016-08-20 LAB — TSH: TSH: 1.344 u[IU]/mL (ref 0.350–4.500)

## 2016-08-20 LAB — ECHOCARDIOGRAM COMPLETE
Height: 70 in
Weight: 2580.8 oz

## 2016-08-20 LAB — MRSA PCR SCREENING: MRSA by PCR: NEGATIVE

## 2016-08-20 MED ORDER — DIPHENHYDRAMINE HCL 50 MG/ML IJ SOLN
12.5000 mg | Freq: Once | INTRAMUSCULAR | Status: AC
  Administered 2016-08-20: 12.5 mg via INTRAVENOUS
  Filled 2016-08-20: qty 1

## 2016-08-20 MED ORDER — HYDRALAZINE HCL 20 MG/ML IJ SOLN
10.0000 mg | Freq: Once | INTRAMUSCULAR | Status: AC
  Administered 2016-08-20: 10 mg via INTRAVENOUS
  Filled 2016-08-20: qty 1

## 2016-08-20 MED ORDER — PROCHLORPERAZINE EDISYLATE 5 MG/ML IJ SOLN
5.0000 mg | Freq: Once | INTRAMUSCULAR | Status: AC
  Administered 2016-08-20: 5 mg via INTRAVENOUS
  Filled 2016-08-20: qty 2

## 2016-08-20 MED ORDER — SEVELAMER CARBONATE 2.4 G PO PACK
2.4000 g | PACK | Freq: Three times a day (TID) | ORAL | Status: DC
  Filled 2016-08-20 (×3): qty 1

## 2016-08-20 NOTE — Progress Notes (Signed)
Subjective:  BP still very high- SOB and headache- getting ready to start him on HD Objective Vital signs in last 24 hours: Vitals:   08/20/16 0433 08/20/16 0611 08/20/16 0827 08/20/16 0957  BP: (!) 196/108 (!) 181/109 (!) 189/119 (!) 176/118  Pulse: (!) 111 (!) 111 (!) 117 (!) 115  Resp: 18   18  Temp: 98.2 F (36.8 C)   98.7 F (37.1 C)  TempSrc: Oral   Oral  SpO2: 95%  99% 100%  Weight:      Height:       Weight change:   Intake/Output Summary (Last 24 hours) at 08/20/16 1031 Last data filed at 08/20/16 1028  Gross per 24 hour  Intake              303 ml  Output                0 ml  Net              303 ml   Dialyzes at [facility] 3 hr 45 min   EDW 74 kg. HD Bath 2K 2 Ca  Dialyzer F180, Heparin none. Access L forearm AVF Mircera 150 mcg q 2 weeks to start 6/21, ? Looks like pt refused it due to allergic reaction on 6/6; notes from HD unit say pt will accept Aranesp No venofer or hectorol  Assessment/Plan: 1 SOB: CXR with increased vascular congestion and pleural effusions.  Likely primary driver.  Dont' suspect sepsis at this point.  He needs an EDW challenge and serial HD treatments. 2 ESRD: TTS at [facility] via AVF.  He has been attending treatments recently.  We will plan for serial treatments; will perform sequential today goal of 3 liters ; perhaps an extra session of HD on Monday if still symptomatic before going back to normal TTS schedule.   3 Hypertension/ Volume: expect some BP issues to improve with better volume status.  He says that he wants to come off all BP meds and serially add them back.  I'm not sure that's the best idea given that his BP is so high already.  Cozaar/hydralazine and clonidine patch.  But- he potentially could come off some meds if vol status gets better. 4. Anemia of ESRD: Has been refusing ESA and iron- anemia is not helping his SOB.  ? Perhaps he will take aranesp as noted in OP records.  Will need at least 200 mcg q weekly, will wait until BP is  better 5. Metabolic Bone Disease: Phos 5.9 on 6/23; no hectorol, sensipar.  Takes renvela packets- will order 6.  Nutrition: albumin 3.0, needs to increase protein 7.  HIV/AIDS: recently had PCP pneumonia.  On Descovy and Tivicay. Per primary 8.  Seizure disorder: on Keppra.  Per primary.  Jim Wyatt    Labs: Basic Metabolic Panel:  Recent Labs Lab 08/19/16 1727 08/20/16 0454  NA 138 136  K 4.0 3.8  CL 97* 96*  CO2 32 30  GLUCOSE 85 91  BUN 22* 28*  CREATININE 6.85* 8.56*  CALCIUM 9.0 9.5  PHOS  --  5.8*   Liver Function Tests:  Recent Labs Lab 08/19/16 1727 08/20/16 0454  AST 22  --   ALT 10*  --   ALKPHOS 52  --   BILITOT 1.0  --   PROT 7.6  --   ALBUMIN 3.0* 3.1*   No results for input(s): LIPASE, AMYLASE in the last 168 hours. No results for input(s):  AMMONIA in the last 168 hours. CBC:  Recent Labs Lab 08/19/16 1727 08/20/16 0454  WBC 5.4 6.0  NEUTROABS 3.8  --   HGB 7.7* 8.1*  HCT 25.0* 26.9*  MCV 95.8 97.5  PLT 174 170   Cardiac Enzymes: No results for input(s): CKTOTAL, CKMB, CKMBINDEX, TROPONINI in the last 168 hours. CBG: No results for input(s): GLUCAP in the last 168 hours.  Iron Studies: No results for input(s): IRON, TIBC, TRANSFERRIN, FERRITIN in the last 72 hours. Studies/Results: Dg Chest 2 View  Result Date: 08/19/2016 CLINICAL DATA:  Shortness of breath. EXAM: CHEST  2 VIEW COMPARISON:  August 13, 2016 FINDINGS: Cardiomegaly and mild edema.  No other interval changes. IMPRESSION: Cardiomegaly and mild edema. Electronically Signed   By: Dorise Bullion III M.D   On: 08/19/2016 18:42   Medications: Infusions: . sodium chloride    . sodium chloride    . sodium chloride      Scheduled Medications: . cloNIDine  0.2 mg Transdermal Weekly  . dapsone  100 mg Oral Daily  . dolutegravir  50 mg Oral Daily  . emtricitabine-tenofovir AF  1 tablet Oral Daily  . famotidine  20 mg Oral Daily  . fluticasone  2 spray Each Nare  Daily  . gabapentin  100 mg Oral TID  . heparin  5,000 Units Subcutaneous Q8H  . hydrALAZINE  100 mg Oral TID  . isosorbide mononitrate  60 mg Oral Daily  . [START ON 08/22/2016] levETIRAcetam  250 mg Oral Q T,Th,Sat-1800  . levETIRAcetam  500 mg Oral Daily  . losartan  100 mg Oral QHS  . sodium chloride flush  3 mL Intravenous Q12H  . sodium chloride flush  3 mL Intravenous Q12H  . topiramate  50 mg Oral BID  . triamcinolone ointment  1 application Topical BID  . venlafaxine XR  150 mg Oral Q breakfast    have reviewed scheduled and prn medications.  Physical Exam: General: sitting up in bed- seems SOB- reports H/Wyatt Heart: RRR Lungs: dec BS at bases, rhonchi Abdomen: soft,non tender Extremities: no edema Dialysis Access: left AVF     08/20/2016,10:31 AM  LOS: 1 day

## 2016-08-20 NOTE — Progress Notes (Signed)
Patient's BP 196/108 HR 111. Patient denies any chest pain,has a slight headache but states " I think it's more of my sinus than my blood pressure". MD on call made aware. Order received. Will continue to monitor. Jim Wyatt, Wonda Cheng, Therapist, sports

## 2016-08-20 NOTE — Progress Notes (Signed)
IMTS Cross Cover Progress Note  Evaluated patient at bedside post-dialysis per primary team request. Patient states his breathing has improved with dialysis and he is able to lay down more comfortably without getting short of breath. He does complain of a headache.  Physical Exam: Constitutional: NAD CV: Tachycardia, regular rhythm, no murmurs, rubs or gallops appreciated Resp: CTAB, no increased work of breathing; no rales  Assessment & Plan: Patient's respiratory status seems to have improved with dialysis which is reassuring. No change to primary team's plan.  Acetaminophen for headache.  Alphonzo Grieve, MD IMTS - PGY1 Pager [phone]

## 2016-08-20 NOTE — Progress Notes (Signed)
Patient's BP 185/123 HR 113,patient asymptomatic. MD on call made aware. Will continue to monitor. Xayne Brumbaugh, Wonda Cheng, Therapist, sports

## 2016-08-20 NOTE — Progress Notes (Signed)
  Echocardiogram 2D Echocardiogram has been performed.  Jim Wyatt 08/20/2016, 9:05 AM

## 2016-08-20 NOTE — Procedures (Signed)
Patient was seen on dialysis and the procedure was supervised.  BFR 400  Via AVF BP is  195/119.   Patient appears to be tolerating treatment well- needs seq UF today   Jim Wyatt A 08/20/2016

## 2016-08-20 NOTE — Progress Notes (Signed)
   Subjective:  Patient was resting and awakened from sleep on rounds this morning.  Patient reports continued difficulty breathing.  He denies chest pain or leg swelling.  He reports increased abdominal swelling and a headache.  Objective:  Vital signs in last 24 hours: Vitals:   08/20/16 0155 08/20/16 0433 08/20/16 0611 08/20/16 0827  BP: (!) 185/123 (!) 196/108 (!) 181/109 (!) 189/119  Pulse: (!) 113 (!) 111 (!) 111 (!) 117  Resp: 18 18    Temp:  98.2 F (36.8 C)    TempSrc:  Oral    SpO2: 96% 95%  99%  Weight:      Height:       Physical Exam  Constitutional:  somnolent  Cardiovascular:  Tachycardic with prominent S4 heart sound  Pulmonary/Chest: Effort normal. No respiratory distress. He has no wheezes.  Faint bibasilar crackles L>R  Abdominal: Soft. He exhibits no mass. There is no tenderness. There is no rebound and no guarding.  Mild distension  Musculoskeletal:  No lower extremity edema bilaterally   Skin: Skin is warm and dry.     Assessment/Plan:  Active Problems:   Volume overload  ESRD on hemodialysis T/Th/Sat Patient reports compliance with dialysis.  He attended dialysis last week with last session yesterday.  He reports his shortness of breath started last week and did not improve despite dialysis.  He denies chest pain. Nephrology was consulted overnight and plan is for dialysis - appreciate nephrology's recommendations  HFpEF Patient has faint bibasilar crackles on lung exam with some mild swelling in abdomen.  No lower extremity edema.  Last echo in 3/23/ 2018 showed LV EF 55-60% with grade 1 diastolic dysfunction.  There was mild tricuspid regurgitation.  When compared to prior study from 05/13/2016 which showed a moderate pericardial effusion, the pericardial effusion has slightly improved.  On physical exam with now new S4 heart sound and worsening shortness of breath when laying flat, there was concern for pericardial effusion and decline in  structural heart disease.  Echo this morning did not show change in LV EF.  There was a small to moderate pericardial effusion and no tamponade noted.  No right heart strain noted.  IVC was dilated.  Plan is for dialysis today with challenge of dry weight  - Hemodialysis     Uncontrolled essential Hypertension At home patient is on clonidine 0.2 mg patch, losartan 100 mg daily and hydralazine 100 mg 3 times a day. He has history of allergies to beta blockers and calcium channel blockers, had bad hives with them. -continue home blood pressure medications  History of seizure Takes keppra 500mg  daily with additional dose of 250mg  on dialysis days -Keppra   Obstructive sleep apnea Sleep study done in March 2018.  Does not use recommended BiPAP -BiPAP at night  Anemia of chronic disease Hemoglobin stable. - continue to monitor with CBC  History of provoked DVT in 2015 [place] puts patient in moderate risk group for PE based on hx of DVT and tachycardia.  Will need to consider CT scan if patient does not show improvement in respiratory status.  He is not currently on anticoagulation.  Will do heparin for DVT prophylaxis.   - Heparin for DVT prophylaxis     Dispo: Anticipated discharge pending clinical improvement.   Kalman Shan [place], DO 08/20/2016, 9:20 AM Pager: [phone]

## 2016-08-21 DIAGNOSIS — B20 Human immunodeficiency virus [HIV] disease: Secondary | ICD-10-CM

## 2016-08-21 DIAGNOSIS — R51 Headache: Secondary | ICD-10-CM

## 2016-08-21 LAB — CBC
HCT: 26.9 % — ABNORMAL LOW (ref 39.0–52.0)
Hemoglobin: 8.3 g/dL — ABNORMAL LOW (ref 13.0–17.0)
MCH: 29.4 pg (ref 26.0–34.0)
MCHC: 30.9 g/dL (ref 30.0–36.0)
MCV: 95.4 fL (ref 78.0–100.0)
Platelets: 196 10*3/uL (ref 150–400)
RBC: 2.82 MIL/uL — ABNORMAL LOW (ref 4.22–5.81)
RDW: 16.6 % — ABNORMAL HIGH (ref 11.5–15.5)
WBC: 7.8 10*3/uL (ref 4.0–10.5)

## 2016-08-21 LAB — RENAL FUNCTION PANEL
Albumin: 3.2 g/dL — ABNORMAL LOW (ref 3.5–5.0)
Anion gap: 14 (ref 5–15)
BUN: 44 mg/dL — ABNORMAL HIGH (ref 6–20)
CO2: 28 mmol/L (ref 22–32)
Calcium: 9.6 mg/dL (ref 8.9–10.3)
Chloride: 94 mmol/L — ABNORMAL LOW (ref 101–111)
Creatinine, Ser: 11.86 mg/dL — ABNORMAL HIGH (ref 0.61–1.24)
GFR calc Af Amer: 6 mL/min — ABNORMAL LOW (ref >60)
GFR calc non Af Amer: 5 mL/min — ABNORMAL LOW (ref >60)
Glucose, Bld: 86 mg/dL (ref 65–99)
Phosphorus: 6.8 mg/dL — ABNORMAL HIGH (ref 2.5–4.6)
Potassium: 4 mmol/L (ref 3.5–5.1)
Sodium: 136 mmol/L (ref 135–145)

## 2016-08-21 MED ORDER — DOXAZOSIN MESYLATE 2 MG PO TABS
4.0000 mg | ORAL_TABLET | Freq: Every day | ORAL | Status: DC
  Administered 2016-08-22 (×2): 4 mg via ORAL
  Filled 2016-08-21 (×2): qty 2

## 2016-08-21 MED ORDER — KETOROLAC TROMETHAMINE 30 MG/ML IJ SOLN
30.0000 mg | Freq: Three times a day (TID) | INTRAMUSCULAR | Status: DC | PRN
  Administered 2016-08-21 (×2): 30 mg via INTRAVENOUS
  Filled 2016-08-21 (×2): qty 1

## 2016-08-21 MED ORDER — HYDROCODONE-ACETAMINOPHEN 5-325 MG PO TABS
1.0000 | ORAL_TABLET | Freq: Once | ORAL | Status: AC
  Administered 2016-08-21: 1 via ORAL

## 2016-08-21 MED ORDER — OXYCODONE-ACETAMINOPHEN 5-325 MG PO TABS
1.0000 | ORAL_TABLET | Freq: Once | ORAL | Status: AC | PRN
  Administered 2016-08-21: 1 via ORAL
  Filled 2016-08-21: qty 1

## 2016-08-21 MED ORDER — HYDROCODONE-ACETAMINOPHEN 5-325 MG PO TABS: 1.0000 | ORAL_TABLET | Freq: Once | ORAL | Status: DC | PRN

## 2016-08-21 MED ORDER — HYDROCODONE-ACETAMINOPHEN 5-325 MG PO TABS
1.0000 | ORAL_TABLET | Freq: Once | ORAL | Status: DC | PRN
  Filled 2016-08-21: qty 1

## 2016-08-21 NOTE — Progress Notes (Signed)
[facility] Progress Note   Subjective: "I think we need to stop all my BP meds and start over again. I think I am allergic to something I am taking. And I need to dilaudid IV for headaches while I'm on hemodialysis!". Actually patient has never been worked up for etiology of headaches. Will talk to primary. Agrees to serial HD. Reinforced that he needs to adhere to fluid restrictions.   Objective Vitals:   08/20/16 2047 08/21/16 0011 08/21/16 0504 08/21/16 0900  BP: (!) 173/114 (!) 166/111 (!) 157/103 (!) 165/104  Pulse: (!) 110 (!) 111 (!) 106 (!) 112  Resp: 18  16 17   Temp: 98.5 F (36.9 C)  98.5 F (36.9 C) 98.4 F (36.9 C)  TempSrc:    Oral  SpO2: 100%  100% 95%  Weight: 70.8 kg (156 lb)     Height:       Physical Exam General: Awake, alert, NAD Heart: S1,S2, + S4. No JVD @ 30 degrees.  Lungs: CTAB, decreased in bases bilaterally.  Abdomen: active BS, non-distended, non-tender Extremities: No LE edema Dialysis Access: LUA AVF + bruit   Additional Objective Labs: Basic Metabolic Panel:  Recent Labs Lab 08/19/16 1727 08/20/16 0454 08/21/16 0536  NA 138 136 136  K 4.0 3.8 4.0  CL 97* 96* 94*  CO2 32 30 28  GLUCOSE 85 91 86  BUN 22* 28* 44*  CREATININE 6.85* 8.56* 11.86*  CALCIUM 9.0 9.5 9.6  PHOS  --  5.8* 6.8*   Liver Function Tests:  Recent Labs Lab 08/19/16 1727 08/20/16 0454 08/21/16 0536  AST 22  --   --   ALT 10*  --   --   ALKPHOS 52  --   --   BILITOT 1.0  --   --   PROT 7.6  --   --   ALBUMIN 3.0* 3.1* 3.2*   No results for input(s): LIPASE, AMYLASE in the last 168 hours. CBC:  Recent Labs Lab 08/19/16 1727 08/20/16 0454 08/21/16 0536  WBC 5.4 6.0 7.8  NEUTROABS 3.8  --   --   HGB 7.7* 8.1* 8.3*  HCT 25.0* 26.9* 26.9*  MCV 95.8 97.5 95.4  PLT 174 170 196   Blood Culture    Component Value Date/Time   SDES URINE, RANDOM 06/02/2016 0753   SPECREQUEST NONE 06/02/2016 0753   CULT >=100,000 COLONIES/mL  ENTEROCOCCUS FAECALIS (A) 06/02/2016 0753   REPTSTATUS 06/04/2016 FINAL 06/02/2016 0753    Cardiac Enzymes: No results for input(s): CKTOTAL, CKMB, CKMBINDEX, TROPONINI in the last 168 hours. CBG: No results for input(s): GLUCAP in the last 168 hours. Iron Studies: No results for input(s): IRON, TIBC, TRANSFERRIN, FERRITIN in the last 72 hours. @lablastinr3 @ Studies/Results: Dg Chest 2 View  Result Date: 08/19/2016 CLINICAL DATA:  Shortness of breath. EXAM: CHEST  2 VIEW COMPARISON:  August 13, 2016 FINDINGS: Cardiomegaly and mild edema.  No other interval changes. IMPRESSION: Cardiomegaly and mild edema. Electronically Signed   By: Dorise Bullion III M.D   On: 08/19/2016 18:42   Medications: . sodium chloride    . sodium chloride    . sodium chloride     . cloNIDine  0.2 mg Transdermal Weekly  . dapsone  100 mg Oral Daily  . dolutegravir  50 mg Oral Daily  . emtricitabine-tenofovir AF  1 tablet Oral Daily  . famotidine  20 mg Oral Daily  . fluticasone  2 spray Each Nare Daily  . gabapentin  100 mg  Oral TID  . heparin  5,000 Units Subcutaneous Q8H  . hydrALAZINE  100 mg Oral TID  . isosorbide mononitrate  60 mg Oral Daily  . [START ON 08/22/2016] levETIRAcetam  250 mg Oral Q T,Th,Sat-1800  . levETIRAcetam  500 mg Oral Daily  . losartan  100 mg Oral QHS  . sevelamer carbonate  2.4 g Oral TID WC  . sodium chloride flush  3 mL Intravenous Q12H  . sodium chloride flush  3 mL Intravenous Q12H  . topiramate  50 mg Oral BID  . triamcinolone ointment  1 application Topical BID  . venlafaxine XR  150 mg Oral Q breakfast   Dialysis Orders: T,TH,S [place] 3 hrs 45 min 180 NRe 450/800 74 kg 2.0 K/2.0 Ca UF profile 4 -Heparin: NONE -Mircera 150 mcg IV q 2 weeks (last dose 08/03/16 Last HGB 7.8 08/17/16)   Assessment/Plan: 1. SOB/Volume overload: Cardiomegaly/mild edema on CXR 08/19/16. Urgent HD off schedule yesterday, will have HD again today. Continue lowering EDW.  2. Uncontrolled  HTN: Had started cardura 4 mg PO daily, hydralazine 100 mg PO TID and losartan 100 mg PO q hs on OP med list. Cont lowering EDW. Add cardura.  2. ESRD -T,Th,S HD off schedule today, K+ 4.0 3. Anemia - HGB 8.3. Give ESA tomorrow with HD. Aranesp 100 mcg IV.  4. Secondary hyperparathyroidism - Phos 6.8 Ca 9.6 C Ca 10.24. Cont renvela binders.  5. HTN/volume - as noted above. Serial HD for volume removal. High gains as OP.  6. Nutrition - Albumin 3.2 Renal diet w/fld restrictions. Renal vit/nepro. 7. H/O Seizure disorder: cont Keppra. Per primary 8. H/O HFpef: Lower volume. 9. H/O OSA: Bipap ordered per primary 10. Headaches: requesting IV dilaudid. Per primary 11. HIV/AIDS: On Descovy and Tivicay. Per primary  Jim Wyatt. Brown NP-C 08/21/2016, 11:04 AM  [facility] [phone]  Pt seen, examined and agree w A/P as above. Cont to lower vol see if this controls BP, lowering dry wt as well. Serial HD.  Kelly Splinter MD [employer] pager [phone]   08/21/2016, 1:31 PM

## 2016-08-21 NOTE — Progress Notes (Signed)
   Subjective:  Patient was resting this morning on rounds.  Patient reports improvement in his breathing.  He denies chest pain or leg swelling.  He reports continued headache without benefit from percocet.  Objective:  Vital signs in last 24 hours: Vitals:   08/20/16 1700 08/20/16 2047 08/21/16 0011 08/21/16 0504  BP: (!) 165/107 (!) 173/114 (!) 166/111 (!) 157/103  Pulse:  (!) 110 (!) 111 (!) 106  Resp: 16 18  16   Temp:  98.5 F (36.9 C)  98.5 F (36.9 C)  TempSrc:      SpO2: 99% 100%  100%  Weight:  156 lb (70.8 kg)    Height:       Physical Exam  Cardiovascular:  Tachycardic  Pulmonary/Chest: Effort normal. No respiratory distress. He has no wheezes.  Faint bibasilar crackles L>R  Abdominal: Soft. He exhibits no distension and no mass. There is no tenderness. There is no rebound and no guarding.  Musculoskeletal:  No lower extremity edema bilaterally   Neurological: No cranial nerve deficit.  Skin: Skin is warm and dry.     Assessment/Plan:  Active Problems:   Human immunodeficiency virus (HIV) disease (HCC)   Hypertension   OSA (obstructive sleep apnea)   ESRD on hemodialysis (HCC)   Volume overload  ESRD on hemodialysis T/Th/Sat Patient had dialysis yesterday with improvement in breathing.  Plan is for serial hemodialysis sessions inpatient. - appreciate nephrology's recommendations  HFpEF Faint bibasilar crackles mainly on the left.  No S4 heart sound noted toay. Plan is to continue dialysis today with challenge of dry weight  - Hemodialysis     Uncontrolled essential Hypertension Stable.  Restarted home blood pressure medications yesterday -continue home blood pressure medications  Headache Patient reports headache after dialysis yesterday.  He has tried benadryl and compazine with some benefit and this was given during admission.  He got percocet at night with little benefit.  Will add IV toradol.  Discussed with patient that narcotic medication does  not help with headaches. - IV toradol - tylenol  History of seizure Takes keppra 500mg  daily with additional dose of 250mg  on dialysis days -Keppra   Obstructive sleep apnea Sleep study done in March 2018.  Does not use recommended BiPAP -BiPAP at night  Anemia of chronic disease Hemoglobin stable. - continue to monitor with CBC  History of provoked DVT in 2015 He is not currently on anticoagulation at home.  Will do heparin for DVT prophylaxis while in the hospital.   - Heparin for DVT prophylaxis     Dispo: Anticipated discharge 1-2 days.   Kalman Shan [place], DO 08/21/2016, 10:11 AM Pager: [phone]

## 2016-08-21 NOTE — Progress Notes (Signed)
Medicine attending: I examined this patient today and I concur with the evaluation and management plan which will be subsequently detailed in the daily progress note by resident physician Dr. Romelle Starcher Molt. 36 year old man with end-stage renal disease on dialysis for the last 2 years.  He was variously told his renal failure was either due exclusively to vancomycin or a combination of vancomycin and untreated HIV disease.  I would venture that there is also a component of hypertensive nephropathy.  He was admitted on June 23 with increasing dyspnea felt secondary to volume overload despite compliance with his dialysis treatments.  He had no signs of an acute infection.  No pulmonary infiltrates.  Clinically he appeared to be in heart failure with jugular venous distention and bibasilar rales.  Cardiomegaly and mild congestive changes on chest x-ray but little change compared with a June 17 study. He is breathing better today after a dialysis session yesterday.  His main complaint is a persistent headache for which she is requesting parenteral narcotics which we do not feel are appropriate.  He had an unremarkable lumbar puncture on May 17, 2016.  Negative LP in May 2017 as well. On neurologic exam he is alert, awake, oriented 3, cranial nerves all grossly normal, pupils equal round reactive to light, full extraocular movements, there is mild lateral nystagmus and he is on anticonvulsant therapy with Keppra, no facial asymmetry, tongue midline, palate elevates symmetrically, motor strength 5/5 upper and lower extremities, reflexes 1+ symmetric at the knees and the biceps.  Upper body coordination normal. Impression: 1.  Volume overload related to end-stage renal disease-continue dialysis down to dry weight 2.  Headache.  No focal neurologic deficits.  LP negative 2 within the last year.  In view of the fact that he is already dialysis dependent, I do not think that we can cause any damage by giving him a  trial of nonsteroidal anti-inflammatories for symptomatic treatment of his headache.  We will try Toradol. 3.  HIV positive.  Now on antiretroviral therapy. 4.  Recent treatment of pneumocystis pneumonia May 2018

## 2016-08-21 NOTE — Progress Notes (Signed)
Paged by RN for headache uncontrolled by Tylenol followed by benadryl & compazine cocktail (all given prior to MD night shift). Patient requesting IV dilaudid. I initially gave IV hydralazine 10 mg x 1 for BP 173/114 to see if BP lowering therapy would alleviate HA. RN paged back that BP improved 166/111 but headache remained uncontrolled, again requesting IV dilaudid. Unfortunately treatment options are limited due to ESRD on HD, still makes urine so some residual renal function intact, cannot do NSAIDs. Gave Norco 5-325 x 1. RN again paged that HA uncontrolled, patient still requesting IV pain medicine. Went to bedside to evaluate and patient was sleeping comfortably. Upon waking, he immediately complained of severe HA.  Exam was unremarkable without neurological deficits. Reported that his nephrologist gives him IV dilaudid during dialysis for his headaches. Checked [place] controlled substance database, no recent prescriptions filled. I discussed with patient that IV dilaudid is no appropriate for headaches. He stated that, "it is unacceptable to be in pain in a hospital" and demanded that I treat his pain. Agreed to give him an additional 1 x percocet 5-325, but again informed patient that we would not be treating his headache with IV pain medication.   Velna Ochs, M.D. Pager: [phone] 08/21/2016, 3:15 AM

## 2016-08-22 LAB — CBC
HCT: 27.6 % — ABNORMAL LOW (ref 39.0–52.0)
Hemoglobin: 8.7 g/dL — ABNORMAL LOW (ref 13.0–17.0)
MCH: 29.8 pg (ref 26.0–34.0)
MCHC: 31.5 g/dL (ref 30.0–36.0)
MCV: 94.5 fL (ref 78.0–100.0)
Platelets: 192 10*3/uL (ref 150–400)
RBC: 2.92 MIL/uL — ABNORMAL LOW (ref 4.22–5.81)
RDW: 16.2 % — ABNORMAL HIGH (ref 11.5–15.5)
WBC: 5.9 10*3/uL (ref 4.0–10.5)

## 2016-08-22 LAB — RENAL FUNCTION PANEL
Albumin: 3.1 g/dL — ABNORMAL LOW (ref 3.5–5.0)
Anion gap: 9 (ref 5–15)
BUN: 26 mg/dL — ABNORMAL HIGH (ref 6–20)
CO2: 28 mmol/L (ref 22–32)
Calcium: 9.1 mg/dL (ref 8.9–10.3)
Chloride: 95 mmol/L — ABNORMAL LOW (ref 101–111)
Creatinine, Ser: 8.22 mg/dL — ABNORMAL HIGH (ref 0.61–1.24)
GFR calc Af Amer: 9 mL/min — ABNORMAL LOW (ref >60)
GFR calc non Af Amer: 8 mL/min — ABNORMAL LOW (ref >60)
Glucose, Bld: 78 mg/dL (ref 65–99)
Phosphorus: 4.9 mg/dL — ABNORMAL HIGH (ref 2.5–4.6)
Potassium: 4.1 mmol/L (ref 3.5–5.1)
Sodium: 132 mmol/L — ABNORMAL LOW (ref 135–145)

## 2016-08-22 MED ORDER — LIDOCAINE HCL (PF) 1 % IJ SOLN: 5.0000 mL | INTRAMUSCULAR | Status: DC | PRN

## 2016-08-22 MED ORDER — DARBEPOETIN ALFA 100 MCG/0.5ML IJ SOSY: 100.0000 ug | PREFILLED_SYRINGE | INTRAMUSCULAR | Status: DC

## 2016-08-22 MED ORDER — SODIUM CHLORIDE 0.9 % IV SOLN: 100.0000 mL | INTRAVENOUS | Status: DC | PRN

## 2016-08-22 MED ORDER — DARBEPOETIN ALFA 100 MCG/0.5ML IJ SOSY
100.0000 ug | PREFILLED_SYRINGE | INTRAMUSCULAR | Status: DC
Start: 2016-08-22 — End: 2016-08-23
  Administered 2016-08-23: 100 ug via INTRAVENOUS
  Filled 2016-08-22: qty 0.5

## 2016-08-22 MED ORDER — HEPARIN SODIUM (PORCINE) 1000 UNIT/ML DIALYSIS: 1000.0000 [IU] | INTRAMUSCULAR | Status: DC | PRN

## 2016-08-22 MED ORDER — ALTEPLASE 2 MG IJ SOLR: 2.0000 mg | Freq: Once | INTRAMUSCULAR | Status: DC | PRN

## 2016-08-22 MED ORDER — LIDOCAINE-PRILOCAINE 2.5-2.5 % EX CREA: 1.0000 "application " | TOPICAL_CREAM | CUTANEOUS | Status: DC | PRN

## 2016-08-22 MED ORDER — PENTAFLUOROPROP-TETRAFLUOROETH EX AERO: 1.0000 "application " | INHALATION_SPRAY | CUTANEOUS | Status: DC | PRN

## 2016-08-22 NOTE — Progress Notes (Signed)
[facility] Progress Note   Subjective: "I'm OK." Says he didn't get out of HD until around 0300 this AM. Feels better, looks better.   Objective Vitals:   08/22/16 0330 08/22/16 0355 08/22/16 0451 08/22/16 0915  BP: (!) 162/99 (!) 148/84 (!) 160/109 139/76  Pulse: (!) 18 (!) 105 (!) 113 (!) 111  Resp: 18 18 18 17   Temp:  98.5 F (36.9 C) 98.3 F (36.8 C) 98.2 F (36.8 C)  TempSrc:  Oral Oral Oral  SpO2:  96% 95% 98%  Weight:  69.1 kg (152 lb 5.4 oz)    Height:       Physical Exam General: Awake, NAD Heart: S1,S2, No S4 today. No JVD. Lungs: CTAB A/P Abdomen: Soft, non-tender Extremities: No LE edema Dialysis Access: LFA AVF + bruit   Additional Objective Labs: Basic Metabolic Panel:  Recent Labs Lab 08/20/16 0454 08/21/16 0536 08/22/16 0726  NA 136 136 132*  K 3.8 4.0 4.1  CL 96* 94* 95*  CO2 30 28 28   GLUCOSE 91 86 78  BUN 28* 44* 26*  CREATININE 8.56* 11.86* 8.22*  CALCIUM 9.5 9.6 9.1  PHOS 5.8* 6.8* 4.9*   Liver Function Tests:  Recent Labs Lab 08/19/16 1727 08/20/16 0454 08/21/16 0536 08/22/16 0726  AST 22  --   --   --   ALT 10*  --   --   --   ALKPHOS 52  --   --   --   BILITOT 1.0  --   --   --   PROT 7.6  --   --   --   ALBUMIN 3.0* 3.1* 3.2* 3.1*   No results for input(s): LIPASE, AMYLASE in the last 168 hours. CBC:  Recent Labs Lab 08/19/16 1727 08/20/16 0454 08/21/16 0536 08/22/16 0726  WBC 5.4 6.0 7.8 5.9  NEUTROABS 3.8  --   --   --   HGB 7.7* 8.1* 8.3* 8.7*  HCT 25.0* 26.9* 26.9* 27.6*  MCV 95.8 97.5 95.4 94.5  PLT 174 170 196 192   Blood Culture    Component Value Date/Time   SDES URINE, RANDOM 06/02/2016 0753   SPECREQUEST NONE 06/02/2016 0753   CULT >=100,000 COLONIES/mL ENTEROCOCCUS FAECALIS (A) 06/02/2016 0753   REPTSTATUS 06/04/2016 FINAL 06/02/2016 0753    Cardiac Enzymes: No results for input(s): CKTOTAL, CKMB, CKMBINDEX, TROPONINI in the last 168 hours. CBG: No results for input(s):  GLUCAP in the last 168 hours. Iron Studies: No results for input(s): IRON, TIBC, TRANSFERRIN, FERRITIN in the last 72 hours. @lablastinr3 @ Studies/Results: No results found. Medications: . sodium chloride    . sodium chloride    . sodium chloride     . cloNIDine  0.2 mg Transdermal Weekly  . dapsone  100 mg Oral Daily  . dolutegravir  50 mg Oral Daily  . doxazosin  4 mg Oral QHS  . emtricitabine-tenofovir AF  1 tablet Oral Daily  . famotidine  20 mg Oral Daily  . fluticasone  2 spray Each Nare Daily  . gabapentin  100 mg Oral TID  . heparin  5,000 Units Subcutaneous Q8H  . hydrALAZINE  100 mg Oral TID  . isosorbide mononitrate  60 mg Oral Daily  . levETIRAcetam  250 mg Oral Q T,Th,Sat-1800  . levETIRAcetam  500 mg Oral Daily  . losartan  100 mg Oral QHS  . sevelamer carbonate  2.4 g Oral TID WC  . sodium chloride flush  3 mL Intravenous Q12H  .  sodium chloride flush  3 mL Intravenous Q12H  . topiramate  50 mg Oral BID  . triamcinolone ointment  1 application Topical BID  . venlafaxine XR  150 mg Oral Q breakfast     Dialysis Orders: T,TH,S [place] 3 hrs 45 min 180 NRe 450/800 74 kg 2.0 K/2.0 Ca UF profile 4 -Heparin: NONE -Mircera 150 mcg IV q 2 weeks (last dose 08/03/16 Last HGB 7.8 08/17/16)   Assessment/Plan: 1. SOB/Volume overload: Cardiomegaly/mild edema on CXR 08/19/16. Urgent HD off schedule yesterday,HD off schedule 08/21/16. Seems much improved. HD today to get back on schedule.  2. Uncontrolled HTN: Had started cardura 4 mg PO daily, hydralazine 100 mg PO TID and losartan 100 mg PO q hs on OP med list. Cont lowering EDW. Add cardura. Better control of BP this AM.  2. ESRD -T,Th,S Short HD today to get back on schedule. Needs to be done this afternoon as he was did not get out of HD until early this AM.  3. Anemia - HGB 8.3. Give ESA tomorrow with HD. Aranesp 100 mcg IV.  4. Secondary hyperparathyroidism - Phos 4.9 Ca 9.1 C Ca 9.8 today. Cont renvela binders.   5. HTN/volume - as noted above. Serial HD for volume removal. Wt now down 4.1 kg from adm wt. Attempt 1.5-2 liters today.   6. Nutrition - Albumin 3.1 Renal diet w/fld restrictions. Renal vit/nepro. 7. H/O Seizure disorder: cont Keppra. Per primary 8. H/O HFpef: Lower volume. 9. H/O OSA: Bipap ordered per primary 10. Headaches: Better control with toradol. Per primary 11. HIV/AIDS: On Descovy and Tivicay. Per primary  Jimmye Norman. Brown NP-C 08/22/2016, 10:47 AM  [facility] [phone]  Pt seen, examined and agree w A/P as above. HD again today, hopefully BP's should improve as we get all the excess fluid off and find a new lower dry wt.  Pt has lost body wt.   Kelly Splinter MD [employer] pager [phone]   08/22/2016, 3:38 PM

## 2016-08-22 NOTE — Discharge Summary (Signed)
Name: Jim Wyatt MRN: [id number] DOB: March 30, 1980 36 y.o. PCP: [name], MD  Date of Admission: 08/19/2016  5:03 PM Date of Discharge: 08/23/2016 Attending Physician: Annia Belt, MD  Discharge Diagnosis: 1. Shortness of breath from Volume overload secondary to ESRD on hemodialysis  Active Problems:   Human immunodeficiency virus (HIV) disease (HCC)   Hypertension   OSA (obstructive sleep apnea)   ESRD on hemodialysis (HCC)   Volume overload   Discharge Medications: Allergies as of 08/23/2016      Reactions   Bactrim [sulfamethoxazole-trimethoprim] Other (See Comments)   Renal failure   Onion Hives, Other (See Comments)   Per allergy test   Procardia [nifedipine] Hives, Shortness Of Breath, Itching   Reglan [metoclopramide] Other (See Comments)   Causes ticks/ jerks   Vancomycin Other (See Comments)   Patient states vancomycin caused kidney injury   Metoprolol Hives   Amlodipine Palpitations, Other (See Comments)   Causes fatigue   Augmentin [amoxicillin-pot Clavulanate] Hives      Medication List    TAKE these medications   acetaminophen 500 MG tablet Commonly known as:  TYLENOL Take 1,000 mg by mouth every 6 (six) hours as needed (pain).   albuterol 108 (90 Base) MCG/ACT inhaler Commonly known as:  PROVENTIL HFA;VENTOLIN HFA Inhale 2 puffs into the lungs every 6 (six) hours as needed for wheezing or shortness of breath.   cloNIDine 0.1 mg/24hr patch Commonly known as:  CATAPRES - Dosed in mg/24 hr Place 0.2 mg onto the skin every Sunday. What changed:  Another medication with the same name was changed. Make sure you understand how and when to take each.   cloNIDine 0.2 mg/24hr patch Commonly known as:  CATAPRES - Dosed in mg/24 hr Place 1 patch (0.2 mg total) onto the skin once a week. What changed:  when to take this   dapsone 100 MG tablet Take 1 tablet (100 mg total) by mouth daily.   diphenhydrAMINE 25 mg capsule Commonly  known as:  BENADRYL Take 1 capsule (25 mg total) by mouth every 6 (six) hours as needed. What changed:  how much to take  when to take this   dolutegravir 50 MG tablet Commonly known as:  TIVICAY Take 1 tablet (50 mg total) by mouth daily.   emtricitabine-tenofovir AF 200-25 MG tablet Commonly known as:  DESCOVY Take 1 tablet by mouth daily.   famotidine 20 MG tablet Commonly known as:  PEPCID Take 1 tablet (20 mg total) by mouth 2 (two) times daily. What changed:  when to take this  reasons to take this   fluticasone 50 MCG/ACT nasal spray Commonly known as:  FLONASE Place 2 sprays into both nostrils daily.   gabapentin 100 MG capsule Commonly known as:  NEURONTIN Take 1 capsule (100 mg total) by mouth 3 (three) times daily. What changed:  when to take this   hydrALAZINE 100 MG tablet Commonly known as:  APRESOLINE Take 100-200 mg by mouth See admin instructions. Take 1 tablet (100 mg) by mouth three times daily, take 2 tablets (200 mg) for SBP >190   hydrALAZINE 50 MG tablet Commonly known as:  APRESOLINE Take 2 tablets (100 mg total) by mouth 3 (three) times daily.   hydrOXYzine 25 MG tablet Commonly known as:  ATARAX/VISTARIL Take 1 tablet (25 mg total) by mouth 3 (three) times daily as needed for itching. What changed:  when to take this   isosorbide mononitrate 60 MG 24 hr tablet Commonly  known as:  IMDUR Take 1 tablet (60 mg total) by mouth daily.   levETIRAcetam 500 MG tablet Commonly known as:  KEPPRA Take 500 mg by mouth daily. What changed:  Another medication with the same name was changed. Make sure you understand how and when to take each.   levETIRAcetam 250 MG tablet Commonly known as:  KEPPRA Take 1 tablet (250 mg) by mouth on Tuesday, Thursday, Saturday at 7pm What changed:  how much to take  how to take this  when to take this  additional instructions   losartan 100 MG tablet Commonly known as:  COZAAR Take 100 mg by mouth  daily.   pantoprazole 40 MG tablet Commonly known as:  PROTONIX Take 1 tablet (40 mg total) by mouth daily.   sodium chloride 0.65 % Soln nasal spray Commonly known as:  OCEAN Place 1 spray into both nostrils as needed for congestion.   sucralfate 1 GM/10ML suspension Commonly known as:  CARAFATE Take 10 mLs (1 g total) by mouth 4 (four) times daily -  with meals and at bedtime.   topiramate 50 MG tablet Commonly known as:  TOPAMAX Take 1 tablet (50 mg total) by mouth 2 (two) times daily.   triamcinolone ointment 0.5 % Commonly known as:  KENALOG Apply 1 application topically 2 (two) times daily. What changed:  when to take this  reasons to take this       Disposition and follow-up:   [name] was discharged from [facility] in good condition.  At the hospital follow up visit please address:  1.  Hypertension and current blood pressure regimen  2.  Labs / imaging needed at time of follow-up: none  3.  Pending labs/ test needing follow-up: none  Follow-up Appointments: Follow-up Information    [name], MD Follow up.   Specialty:  Internal Medicine Contact information: [address]  [place] [address] [phone]           Hospital Course by problem list: Active Problems:   Human immunodeficiency virus (HIV) disease ([place])   Hypertension   OSA (obstructive sleep apnea)   ESRD on hemodialysis (HCC)   Volume overload   Shortness of breath from volume overload secondary to ESRD on hemodialysis t/th/sat Patient presented with shortness of breath. Patient had faint bibasilar crackles on lung exam with mild abdominal swelling. Chest x-ray showed mild pulmonary edema. Patient had serial dialysis sessions while inpatient. After these dialysis sessions patient's symptoms improved greatly. Patient reported his dry weight to be 162Ib prior to admission. This was challenged during admission and patient's new dry weight is  150lb.   Chronic Diastolic Heart Failure Repeat Echo during this admission showed LV EF 55-60%, small to moderate pericardial effusion with no tamponade although IVC was dilated.  Patient's shortness of breath resolved with serial dialysis sessions.  Likely overload from elevated dry weight and not heart failure.  Uncontrolled essential Hypertension At home patient is on clonidine 0.2 mg patch,losartan 100 mg daily and hydralazine 100 mg 3 times a day. Home medications were continued on admission.   History of seizure Takes keppra 500mg  daily with additional dose of 250mg  on dialysis days at home.  This was continued on admission.   Obstructive sleep apnea Sleep study done in March 2018.  Does not use recommended BiPAP.  Ordered BiPAP during admission  Anemia of chronic disease Hemoglobin was stable during admission.  History of provoked DVT in 2015 Heparin for DVT  prophylaxis during admission   Discharge Vitals:   BP (!) 107/53 (BP Location: Right Arm)   Pulse 100   Temp 98.5 F (36.9 C) (Oral)   Resp 16   Ht 5\' 7"  (1.702 m)   Wt 150 lb 9.2 oz (68.3 kg)   SpO2 100%   BMI 23.58 kg/m    Discharge Instructions: Discharge Instructions    Diet - low sodium heart healthy    Complete by:  As directed    Increase activity slowly    Complete by:  As directed       Signed: Valinda Party, DO 08/25/2016, 1:02 PM   Pager: [phone]

## 2016-08-22 NOTE — Progress Notes (Signed)
   Subjective:  Patient was resting this morning on rounds.  Patient reports continued improvement in his breathing.  He denies chest pain or leg swelling.  He reports improvement in headache with toradol.  Objective:  Vital signs in last 24 hours: Vitals:   08/22/16 0300 08/22/16 0330 08/22/16 0355 08/22/16 0451  BP: (!) 166/88 (!) 162/99 (!) 148/84 (!) 160/109  Pulse: (!) 110 (!) 18 (!) 105 (!) 113  Resp: 18 18 18 18   Temp:   98.5 F (36.9 C) 98.3 F (36.8 C)  TempSrc:   Oral Oral  SpO2:   96% 95%  Weight:   152 lb 5.4 oz (69.1 kg)   Height:       Physical Exam  Cardiovascular:  Tachycardic  Pulmonary/Chest: Effort normal and breath sounds normal. No respiratory distress. He has no wheezes. He has no rales.  Abdominal: Soft. He exhibits no distension and no mass. There is no tenderness. There is no rebound and no guarding.  Musculoskeletal:  No lower extremity edema bilaterally   Neurological: No cranial nerve deficit.  Skin: Skin is warm and dry.     Assessment/Plan:  Active Problems:   Human immunodeficiency virus (HIV) disease (HCC)   Hypertension   OSA (obstructive sleep apnea)   ESRD on hemodialysis (HCC)   Volume overload  ESRD on hemodialysis T/Th/Sat Denies shortness of breath and satting 98% on room air.  Patient had hemodialysis this morning.  Will await further recommendations from nephrology. - appreciate nephrology's recommendations  HFpEF No bibasilar crackles appreciated.  No S4 heart sound noted toay. Patient dry weight has been challenged with hemodialysis while inpatient.  Patient was admitted with weight of 161lb and currently 152lbs.     Uncontrolled essential Hypertension Stable.  Restarted home blood pressure medications on admission -continue home blood pressure medications  Headache Patient reports improvement with IV toradol.   - IV toradol - tylenol  History of seizure Takes keppra 500mg  daily with additional dose of 250mg  on  dialysis days -Keppra   Obstructive sleep apnea Sleep study done in March 2018.  Does not use recommended BiPAP -BiPAP at night  Anemia of chronic disease Hemoglobin stable. - continue to monitor with CBC  History of provoked DVT in 2015 He is not currently on anticoagulation at home.  Will do heparin for DVT prophylaxis while in the hospital.   - Heparin for DVT prophylaxis     Dispo: Anticipated discharge pending nephrology recommendations.   Kalman Shan Ta[place], DO 08/22/2016, 8:48 AM Pager: [phone]

## 2016-08-22 NOTE — Discharge Summary (Signed)
Medicine attending discharge note: I personally examined this patient on the day of discharge and I attest to the accuracy of the discharge evaluation and plan as recorded in the final progress note by resident physician Dr. Kalman Shan.  36 year old man with end-stage renal disease on dialysis.  Etiology of his renal failure likely multifactorial and including previously untreated HIV disease, hypertensive nephropathy, and an element of vancomycin toxicity.  He presented with increasing dyspnea despite compliance with his dialysis treatments.  No signs or symptoms of acute infection.  Mild vascular congestion on chest x-ray.  BNP elevated at 2378 compare with 28.7 on June 2. He was seen in consultation by nephrology.  He was dialyzed daily 4 days.  Respiratory symptoms nearly completely resolved.  Weight down from 161 on admission to 152 prior to fourth dialysis on day of discharge June 26. He had a recurrent, persistent, headache which has been a chronic problem.  While he was under evaluation for his HIV disease, he had 2 lumbar punctures within the last 13 months most recently in March of this year which did not show any infectious etiology for his headache.  A recent August 13, 2016 CT of the brain without contrast showed no major abnormalities. He had good partial relief with Toradol.  Given the end-stage nature of his renal disease we did not feel there was any current contraindication to receive nonsteroidals.  Disposition:  Condition stable at time of discharge There were no complications

## 2016-08-23 ENCOUNTER — Ambulatory Visit: Payer: Self-pay | Admitting: Internal Medicine

## 2016-08-23 LAB — CBC
HCT: 25.4 % — ABNORMAL LOW (ref 39.0–52.0)
Hemoglobin: 8 g/dL — ABNORMAL LOW (ref 13.0–17.0)
MCH: 29.5 pg (ref 26.0–34.0)
MCHC: 31.5 g/dL (ref 30.0–36.0)
MCV: 93.7 fL (ref 78.0–100.0)
Platelets: 183 10*3/uL (ref 150–400)
RBC: 2.71 MIL/uL — ABNORMAL LOW (ref 4.22–5.81)
RDW: 16.1 % — ABNORMAL HIGH (ref 11.5–15.5)
WBC: 6.1 10*3/uL (ref 4.0–10.5)

## 2016-08-23 LAB — RENAL FUNCTION PANEL
Albumin: 3 g/dL — ABNORMAL LOW (ref 3.5–5.0)
Anion gap: 12 (ref 5–15)
BUN: 46 mg/dL — ABNORMAL HIGH (ref 6–20)
CO2: 25 mmol/L (ref 22–32)
Calcium: 9.2 mg/dL (ref 8.9–10.3)
Chloride: 93 mmol/L — ABNORMAL LOW (ref 101–111)
Creatinine, Ser: 11.48 mg/dL — ABNORMAL HIGH (ref 0.61–1.24)
GFR calc Af Amer: 6 mL/min — ABNORMAL LOW (ref >60)
GFR calc non Af Amer: 5 mL/min — ABNORMAL LOW (ref >60)
Glucose, Bld: 93 mg/dL (ref 65–99)
Phosphorus: 7.2 mg/dL — ABNORMAL HIGH (ref 2.5–4.6)
Potassium: 4.5 mmol/L (ref 3.5–5.1)
Sodium: 130 mmol/L — ABNORMAL LOW (ref 135–145)

## 2016-08-23 MED ORDER — DARBEPOETIN ALFA 100 MCG/0.5ML IJ SOSY
PREFILLED_SYRINGE | INTRAMUSCULAR | Status: AC
  Filled 2016-08-23: qty 0.5

## 2016-08-23 MED ORDER — ISOSORBIDE MONONITRATE ER 60 MG PO TB24: 60.0000 mg | ORAL_TABLET | Freq: Every day | ORAL | 0 refills | Status: AC

## 2016-08-23 MED ORDER — SODIUM CHLORIDE 0.9 % IV SOLN: 100.0000 mL | INTRAVENOUS | Status: DC | PRN

## 2016-08-23 NOTE — Progress Notes (Signed)
[facility] Progress Note   Subjective: "I'm ready to get out of here". Feel betters, looks great. No new c/os. Discussed long term plan for OP-watching fluid restrictions is pivotal for this pt. Also needs better adherence to BP meds. 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 Hoping to get on transplant list.   Objective Vitals:   08/23/16 0530 08/23/16 0555 08/23/16 0622 08/23/16 0647  BP: (!) 97/45 (!) 103/38 (!) 96/35 (!) 107/53  Pulse: (!) 105 (!) 103 98 100  Resp: 17 18 12 16   Temp:   98.1 F (36.7 C) 98.5 F (36.9 C)  TempSrc:   Oral Oral  SpO2: 99% 97% 97% 100%  Weight:   68.3 kg (150 lb 9.2 oz)   Height:       Physical Exam General: Pleasant, NAD Heart: S1,S2, RRR Lungs: CTAB A/P Abdomen: soft, non-distended Extremities: No LE edema Dialysis Access: LFA AVF + bruit   Additional Objective Labs: Basic Metabolic Panel:  Recent Labs Lab 08/21/16 0536 08/22/16 0726 08/23/16 0230  NA 136 132* 130*  K 4.0 4.1 4.5  CL 94* 95* 93*  CO2 28 28 25   GLUCOSE 86 78 93  BUN 44* 26* 46*  CREATININE 11.86* 8.22* 11.48*  CALCIUM 9.6 9.1 9.2  PHOS 6.8* 4.9* 7.2*   Liver Function Tests:  Recent Labs Lab 08/19/16 1727  08/21/16 0536 08/22/16 0726 08/23/16 0230  AST 22  --   --   --   --   ALT 10*  --   --   --   --   ALKPHOS 52  --   --   --   --   BILITOT 1.0  --   --   --   --   PROT 7.6  --   --   --   --   ALBUMIN 3.0*  < > 3.2* 3.1* 3.0*  < > = values in this interval not displayed. No results for input(s): LIPASE, AMYLASE in the last 168 hours. CBC:  Recent Labs Lab 08/19/16 1727 08/20/16 0454 08/21/16 0536 08/22/16 0726 08/23/16 0230  WBC 5.4 6.0 7.8 5.9 6.1  NEUTROABS 3.8  --   --   --   --   HGB 7.7* 8.1* 8.3* 8.7* 8.0*  HCT 25.0* 26.9* 26.9* 27.6* 25.4*  MCV 95.8 97.5 95.4 94.5 93.7  PLT 174 170 196 192 183   Blood Culture    Component Value Date/Time   SDES URINE, RANDOM 06/02/2016 0753   SPECREQUEST NONE 06/02/2016  0753   CULT >=100,000 COLONIES/mL ENTEROCOCCUS FAECALIS (A) 06/02/2016 0753   REPTSTATUS 06/04/2016 FINAL 06/02/2016 0753    Cardiac Enzymes: No results for input(s): CKTOTAL, CKMB, CKMBINDEX, TROPONINI in the last 168 hours. CBG: No results for input(s): GLUCAP in the last 168 hours. Iron Studies: No results for input(s): IRON, TIBC, TRANSFERRIN, FERRITIN in the last 72 hours. @lablastinr3 @ Studies/Results: No results found. Medications: . sodium chloride    . sodium chloride    . sodium chloride    . sodium chloride    . sodium chloride     . cloNIDine  0.2 mg Transdermal Weekly  . dapsone  100 mg Oral Daily  . Darbepoetin Alfa      . darbepoetin (ARANESP) injection - DIALYSIS  100 mcg Intravenous Q Tue-HD  . dolutegravir  50 mg Oral Daily  . doxazosin  4 mg Oral QHS  . emtricitabine-tenofovir AF  1 tablet Oral Daily  . famotidine  20 mg Oral  Daily  . fluticasone  2 spray Each Nare Daily  . gabapentin  100 mg Oral TID  . heparin  5,000 Units Subcutaneous Q8H  . hydrALAZINE  100 mg Oral TID  . isosorbide mononitrate  60 mg Oral Daily  . levETIRAcetam  250 mg Oral Q T,Th,Sat-1800  . levETIRAcetam  500 mg Oral Daily  . losartan  100 mg Oral QHS  . sevelamer carbonate  2.4 g Oral TID WC  . sodium chloride flush  3 mL Intravenous Q12H  . sodium chloride flush  3 mL Intravenous Q12H  . topiramate  50 mg Oral BID  . triamcinolone ointment  1 application Topical BID  . venlafaxine XR  150 mg Oral Q breakfast     Dialysis Orders: T,TH,S [place] 3 hrs 45 min 180 NRe 450/800 74 kg 2.0 K/2.0 Ca UF profile 4 -Heparin: NONE -Mircera 150 mcg IV q 2 weeks (last dose 08/03/16 Last HGB 7.8 08/17/16)   Assessment/Plan: 1. SOB/Volume overload: Cardiomegaly/mild edema on CXR 08/19/16. Urgent HD off schedule yesterday,HD off schedule 08/21/16. Resolved with serial HD.  BP's finally controlled with new dry wt which is 5 KG LOWER than his prior weight.  Will adjust edw at OP center.   2. Uncontrolled HTN: Had started cardura 4 mg PO daily, hydralazine 100 mg PO TID and losartan 100 mg PO q hs on OP med list. Cont lowering EDW. Added cardura. BP actually soft today however he is at nadir EDW and is not compliant with fluid restrictions as OP. Discussed compliance with medications.  2. ESRD -T,Th,S will have HD as OP at center. Cautioned OP fluid restrictions.  3. Anemia - HGB 8.0. Rec'd Aranesp 100 mcg IV this AM.  4. Secondary hyperparathyroidism - Phos 7.2-obviously refusing binders while in hospital. Ca 9.2 C Ca 9.8 today. Cont renvela binders.  5. HTN/volume - as noted above. New OP EDW 68.5 kg.  6. Nutrition - Albumin 3.1 Renal diet w/fld restrictions. Renal vit/nepro. 7. H/O Seizure disorder: cont Keppra. Per primary 8. H/O HFpef: Lower volume. 9. H/O OSA: Bipap ordered per primary 10. Headaches: Better control with toradol. Per primary 11. HIV/AIDS: On Descovy and Tivicay. Per primaryy. Has upcoming appt with Dr. Drucilla Schmidt. Needs to have non-detectable CD4 for transplant.  Disposition: Stable for DC. Anxious to go home.   Rita H. Brown NP-C 08/23/2016, 9:47 AM  [facility] [phone]  Pt seen, examined, agree w assess/plan as above with additions as indicated.  Kelly Splinter MD [employer] pager [phone]    cell [phone] 08/23/2016, 12:30 PM

## 2016-08-23 NOTE — Care Management Important Message (Signed)
Important Message  Patient Details  Name: Jim Wyatt MRN: [id number] Date of Birth: [birth year]   Medicare Important Message Given:       Orbie Pyo 08/23/2016, 2:23 PM

## 2016-08-23 NOTE — Progress Notes (Signed)
Patient already asleep when checking to see if needing CPAP/BIPAP. Pt refused.

## 2016-08-23 NOTE — Progress Notes (Addendum)
Discharge instructions, medications and follow up appointments discussed and reviewed with pt, verbalized understanding. Telemetry dc'ed, CCMD notified. IV dc'ed, pressure applied to site X1 minute. Pt waiting for his ride at this time.   Pt was escorted out of the unit in wheelchair, took all belongings with him.

## 2016-08-23 NOTE — Progress Notes (Signed)
HD tx completed @ 6067 w/o problem, UF goal met, blood rinsed back, report called to Tawni Carnes, RN

## 2016-08-23 NOTE — Care Management Note (Signed)
Case Management Note  Patient Details  Name: Jim Wyatt MRN: [id number] Date of Birth: Mar 26, 1980  Subjective/Objective:                 Spoke with patient at the bedside. Patient is independent from home admitted for volume overload, CKD, HD, HIV. Patient compliant w HD. Several admissions in 6 months. Attempt to identify barriers to self care/ readmission prevention assistance. Patient resistant to open up. Patient stated that he goes to dr Tommy Medal for HIV, and [facility] for PCP. He states his last [facility] visit was last month. He states he drives himself to office visits. During our conversation he denied any difficulties obtaining or paying for medication. He states he gets his meds at [facility]. Discussed [facility] pharmacy with patient as well. Patient stated that he could not identify anything that he needs assistance with and is very independent. No further CM needs identified.    Action/Plan:   Expected Discharge Date:  08/23/16               Expected Discharge Plan:  Home/Self Care  In-House Referral:     Discharge planning Services  CM Consult  Post Acute Care Choice:    Choice offered to:     DME Arranged:    DME Agency:     HH Arranged:    HH Agency:     Status of Service:  Completed, signed off  If discussed at [employer] of Stay Meetings, dates discussed:    Additional Comments:  Carles Collet, RN 08/23/2016, 10:43 AM

## 2016-08-23 NOTE — Progress Notes (Signed)
Medicine attending: Due to busy dialysis schedule yesterday, patient did not get dialyzed until 4 AM. He feels well.  Lungs are clear.  Weight down to 150.9 pounds. He is stable for discharge today.

## 2016-08-23 NOTE — Progress Notes (Signed)
HD tx initiated via 15G x2 w/o problem, VSS, will cont to monitor while on HD tx

## 2016-08-23 NOTE — Progress Notes (Signed)
   Subjective:  Patient was resting this morning on rounds.  Patient states he had dialysis this morning without issues.  Patient reports continued improvement in his breathing.  He denies chest pain or leg swelling.    Objective:  Vital signs in last 24 hours: Vitals:   08/23/16 0530 08/23/16 0555 08/23/16 0622 08/23/16 0647  BP: (!) 97/45 (!) 103/38 (!) 96/35 (!) 107/53  Pulse: (!) 105 (!) 103 98 100  Resp: 17 18 12 16   Temp:   98.1 F (36.7 C) 98.5 F (36.9 C)  TempSrc:   Oral Oral  SpO2: 99% 97% 97% 100%  Weight:   150 lb 9.2 oz (68.3 kg)   Height:       Physical Exam  Cardiovascular: Normal rate, regular rhythm and normal heart sounds.  Exam reveals no gallop and no friction rub.   No murmur heard. Pulmonary/Chest: Effort normal and breath sounds normal. No respiratory distress. He has no wheezes. He has no rales.  Abdominal: Soft. He exhibits no distension and no mass. There is no tenderness. There is no rebound and no guarding.  Musculoskeletal:  No lower extremity edema bilaterally   Neurological: No cranial nerve deficit.  Skin: Skin is warm and dry.    Assessment/Plan:  Active Problems:   Human immunodeficiency virus (HIV) disease (HCC)   Hypertension   OSA (obstructive sleep apnea)   ESRD on hemodialysis (HCC)   Volume overload  ESRD on hemodialysis T/Th/Sat Denies shortness of breath and satting 100% on room air.  Patient had hemodialysis this morning. Patient's dry weight has been challenged with hemodialysis while inpatient. Previous dry weight was 162lbs.  Patient was admitted with weight of 161lb and currently 150lbs. -discharge today      HFpEF No bibasilar crackles appreciated.  No S4 heart sound noted toay. - monitor   Uncontrolled essential Hypertension Stable.  Restarted home blood pressure medications on admission -continue home blood pressure medications  Headache Patient reports improvement with IV toradol.   - IV toradol -  tylenol  History of seizure Takes keppra 500mg  daily with additional dose of 250mg  on dialysis days -Keppra   Obstructive sleep apnea Sleep study done in March 2018.  Does not use recommended BiPAP -BiPAP at night  Anemia of chronic disease Hemoglobin stable. - continue to monitor with CBC  History of provoked DVT in 2015 He is not currently on anticoagulation at home.  Will do heparin for DVT prophylaxis while in the hospital.   - Heparin for DVT prophylaxis     Dispo: Anticipated discharge today.   Kalman Shan [place], DO 08/23/2016, 7:38 AM Pager: [phone]

## 2016-09-04 ENCOUNTER — Encounter: Payer: Self-pay | Admitting: Infectious Disease

## 2016-09-04 ENCOUNTER — Ambulatory Visit (INDEPENDENT_AMBULATORY_CARE_PROVIDER_SITE_OTHER): Payer: Self-pay | Admitting: Infectious Disease

## 2016-09-04 VITALS — BP 194/106 | HR 108 | Temp 98.3°F | Ht 67.0 in | Wt 163.0 lb

## 2016-09-04 DIAGNOSIS — Z205 Contact with and (suspected) exposure to viral hepatitis: Secondary | ICD-10-CM

## 2016-09-04 DIAGNOSIS — Z7252 High risk homosexual behavior: Secondary | ICD-10-CM

## 2016-09-04 DIAGNOSIS — N186 End stage renal disease: Secondary | ICD-10-CM

## 2016-09-04 DIAGNOSIS — N032 Chronic nephritic syndrome with diffuse membranous glomerulonephritis: Secondary | ICD-10-CM

## 2016-09-04 DIAGNOSIS — M60009 Infective myositis, unspecified site: Secondary | ICD-10-CM

## 2016-09-04 DIAGNOSIS — B59 Pneumocystosis: Secondary | ICD-10-CM

## 2016-09-04 DIAGNOSIS — Z23 Encounter for immunization: Secondary | ICD-10-CM

## 2016-09-04 DIAGNOSIS — B20 Human immunodeficiency virus [HIV] disease: Secondary | ICD-10-CM

## 2016-09-04 NOTE — Progress Notes (Signed)
Subjective:    Chief complaint: Stress related to some relatives have been showing disrespectful to his mother.   Patient ID: Jim Wyatt, male    DOB: [birth year], 36 y.o.   MRN: [id number]  HPI  36 y.o. 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  He has been treated with > 21 days of clinda, primaquin and prednisone (was still taking prednisone and clindamycin with him).  We simplified his prior regimen to Tivicay and Descovy and he dropped his VL nicely from over 150K to 149 copies and downwards. He is bothered by the fact that his viral load was not undetectable when checked in the hospital but this could be an artifact of how the blood was processed. We will recheck a viral load and CD4 count today. He is also anxious to be evaluated for kidney transplantation. I explained to him that the transplant physicians when they evaluated him may wish for him to have better immune reconstitution prior to giving him immunosuppressive therapy. He thought this was reasonable and likely for my speculative answer. His blood pressure was quite high today but he stated he had not taken his afternoon antihypertensives.    Lab Results  Component Value Date   HIV1RNAQUANT 79 (H) 08/14/2016   HIV1RNAQUANT 42 (H) 07/28/2016   HIV1RNAQUANT 149 (H) 06/23/2016   Lab Results  Component Value Date   CD4TABS 90 (L) 08/14/2016   CD4TABS 70 (L) 07/28/2016   CD4TABS 50 (L) 07/10/2016   He is eager to get kidney transplant (and Mom will donate her kidney if he qualifies)  Past Medical History:  Diagnosis Date  . Asthma   . CAP (community acquired pneumonia) 10/2013  . Cellulitis 11/2013   LLE  . DVT (deep venous thrombosis) ([place]) 10/2013   BLE  . 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  Went on HD Dec 2016.   [name] Fever   . Headache   . HIV infection ([place]) dx'd 10/2013  . Hypertension   . Leg pain   . Multiple environmental allergies    "trees, dogs, cats"  . Multiple food allergies   . Shingles < 2010    Past Surgical History:  Procedure Laterality Date  . AV FISTULA PLACEMENT Left 03/03/2015   Procedure: Creation of Left Arm RADIOCEPHALIC ARTERIOVENOUS FISTULA ;  Surgeon: Mal Misty, MD;  Location: [facility];  Service: Vascular;  Laterality: Left;  . COLONOSCOPY N/A 03/18/2016   Procedure: COLONOSCOPY;  Surgeon: Manus Gunning, MD;  Location: [facility];  Service: Gastroenterology;  Laterality: N/A;  . FEMUR FRACTURE SURGERY Right 09/2001  . FRACTURE SURGERY    . I&D EXTREMITY Right 12/18/2014   Procedure: IRRIGATION AND DEBRIDEMENT RIGHT LOWER EXTREMITY;  Surgeon: Ralene Ok, MD;  Location: [facility];  Service: General;  Laterality: Right;  . INSERTION OF DIALYSIS CATHETER  2016  . LAPAROTOMY N/A 12/28/2015   Procedure: EXPLORATORY LAPAROTOMY BOWEL RESECTION ILEOCECECTOMY;  Surgeon: Clovis Riley, MD;  Location: [facility];  Service: General;  Laterality: N/A;    Family History  Problem Relation Age of Onset  . Hypertension Mother   . Diabetes Maternal Grandmother   . Kidney disease Maternal Grandmother  Social History   Social History  . Marital status: Single    Spouse name: N/A  . Number of children: N/A  . Years of education: N/A   Social History Main Topics  . Smoking status: Never Smoker  . Smokeless tobacco: Never Used  . Alcohol use No     Comment: I stopped drinking along time ago "  . Drug use: No  . Sexual activity: Not on file   Other Topics Concern  . Not on file   Social History Narrative  . No narrative on file    Allergies  Allergen Reactions  . Bactrim [Sulfamethoxazole-Trimethoprim] Other (See Comments)    Renal failure  . Onion Hives and  Other (See Comments)    Per allergy test  . Procardia [Nifedipine] Hives, Shortness Of Breath and Itching  . Reglan [Metoclopramide] Other (See Comments)    Causes ticks/ jerks  . Vancomycin Other (See Comments)    Patient states vancomycin caused kidney injury  . Metoprolol Hives  . Amlodipine Palpitations and Other (See Comments)    Causes fatigue  . Augmentin [Amoxicillin-Pot Clavulanate] Hives     Current Outpatient Prescriptions:  .  acetaminophen (TYLENOL) 500 MG tablet, Take 1,000 mg by mouth every 6 (six) hours as needed (pain)., Disp: , Rfl:  .  albuterol (PROVENTIL HFA;VENTOLIN HFA) 108 (90 Base) MCG/ACT inhaler, Inhale 2 puffs into the lungs every 6 (six) hours as needed for wheezing or shortness of breath., Disp: 1 Inhaler, Rfl: 0 .  cloNIDine (CATAPRES - DOSED IN MG/24 HR) 0.1 mg/24hr patch, Place 0.2 mg onto the skin every Sunday., Disp: , Rfl:  .  cloNIDine (CATAPRES - DOSED IN MG/24 HR) 0.2 mg/24hr patch, Place 1 patch (0.2 mg total) onto the skin once a week. (Patient taking differently: Place 0.2 mg onto the skin every Sunday. ), Disp: 4 patch, Rfl: 12 .  dapsone 100 MG tablet, Take 1 tablet (100 mg total) by mouth daily., Disp: 30 tablet, Rfl: 11 .  diphenhydrAMINE (BENADRYL) 25 mg capsule, Take 1 capsule (25 mg total) by mouth every 6 (six) hours as needed. (Patient taking differently: Take 50 mg by mouth daily. ), Disp: 30 capsule, Rfl: 0 .  dolutegravir (TIVICAY) 50 MG tablet, Take 1 tablet (50 mg total) by mouth daily., Disp: 30 tablet, Rfl: 6 .  emtricitabine-tenofovir AF (DESCOVY) 200-25 MG tablet, Take 1 tablet by mouth daily., Disp: 30 tablet, Rfl: 6 .  famotidine (PEPCID) 20 MG tablet, Take 1 tablet (20 mg total) by mouth 2 (two) times daily. (Patient taking differently: Take 20 mg by mouth 2 (two) times daily as needed for heartburn (acid reflux). ), Disp: 30 tablet, Rfl: 0 .  fluticasone (FLONASE) 50 MCG/ACT nasal spray, Place 2 sprays into both nostrils daily.  (Patient not taking: Reported on 08/19/2016), Disp: 16 g, Rfl: 0 .  gabapentin (NEURONTIN) 100 MG capsule, Take 1 capsule (100 mg total) by mouth 3 (three) times daily. (Patient taking differently: Take 100 mg by mouth daily. ), Disp: 90 capsule, Rfl: 3 .  hydrALAZINE (APRESOLINE) 100 MG tablet, Take 100-200 mg by mouth See admin instructions. Take 1 tablet (100 mg) by mouth three times daily, take 2 tablets (200 mg) for SBP >190, Disp: , Rfl:  .  hydrALAZINE (APRESOLINE) 50 MG tablet, Take 2 tablets (100 mg total) by mouth 3 (three) times daily. (Patient not taking: Reported on 08/19/2016), Disp: 180 tablet, Rfl: 0 .  hydrOXYzine (ATARAX/VISTARIL) 25 MG tablet, Take 1 tablet (25  mg total) by mouth 3 (three) times daily as needed for itching. (Patient taking differently: Take 25 mg by mouth 2 (two) times daily. ), Disp: 90 tablet, Rfl: 3 .  isosorbide mononitrate (IMDUR) 60 MG 24 hr tablet, Take 1 tablet (60 mg total) by mouth daily., Disp: 30 tablet, Rfl: 0 .  levETIRAcetam (KEPPRA) 250 MG tablet, Take 1 tablet (250 mg) by mouth on Tuesday, Thursday, Saturday at 7pm (Patient taking differently: Take 250 mg by mouth See admin instructions. Take one tablet (250 mg) by mouth every Tuesday, Thursday and Saturday at 7 pm), Disp: 60 tablet, Rfl: 11 .  levETIRAcetam (KEPPRA) 500 MG tablet, Take 500 mg by mouth daily., Disp: , Rfl:  .  losartan (COZAAR) 100 MG tablet, Take 100 mg by mouth daily. , Disp: , Rfl:  .  pantoprazole (PROTONIX) 40 MG tablet, Take 1 tablet (40 mg total) by mouth daily. (Patient not taking: Reported on 08/19/2016), Disp: 30 tablet, Rfl: 0 .  sodium chloride (OCEAN) 0.65 % SOLN nasal spray, Place 1 spray into both nostrils as needed for congestion. (Patient not taking: Reported on 08/19/2016), Disp: 480 mL, Rfl: 0 .  sucralfate (CARAFATE) 1 GM/10ML suspension, Take 10 mLs (1 g total) by mouth 4 (four) times daily -  with meals and at bedtime. (Patient not taking: Reported on 08/14/2016), Disp:  420 mL, Rfl: 0 .  topiramate (TOPAMAX) 50 MG tablet, Take 1 tablet (50 mg total) by mouth 2 (two) times daily., Disp: 60 tablet, Rfl: 0 .  triamcinolone ointment (KENALOG) 0.5 %, Apply 1 application topically 2 (two) times daily. (Patient taking differently: Apply 1 application topically 2 (two) times daily as needed (rash/ itching). ), Disp: 30 g, Rfl: 2   Review of Systems  Constitutional: Negative for chills and fever.  HENT: Negative for congestion and sore throat.   Eyes: Negative for photophobia.  Respiratory: Negative for cough, shortness of breath and wheezing.   Cardiovascular: Negative for chest pain, palpitations and leg swelling.  Gastrointestinal: Negative for abdominal pain, blood in stool, constipation, diarrhea, nausea and vomiting.  Genitourinary: Negative for dysuria, flank pain and hematuria.  Musculoskeletal: Negative for back pain and myalgias.  Neurological: Negative for dizziness, weakness and headaches.  Hematological: Does not bruise/bleed easily.  Psychiatric/Behavioral: Negative for suicidal ideas.       Objective:   Physical Exam  Constitutional: He is oriented to person, place, and time. No distress.  HENT:  Head: Normocephalic and atraumatic.  Mouth/Throat: No oropharyngeal exudate.  Eyes: Conjunctivae and EOM are normal. No scleral icterus.  Neck: Normal range of motion. Neck supple.  Cardiovascular: Normal rate and regular rhythm.   Pulmonary/Chest: Effort normal. No respiratory distress. He has no wheezes.  Abdominal: He exhibits no distension.  Musculoskeletal: He exhibits no edema or tenderness.  Neurological: He is alert and oriented to person, place, and time. He exhibits normal muscle tone. Coordination normal.  Skin: Skin is warm and dry. No rash noted. He is not diaphoretic. No erythema. No pallor.  Psychiatric: He has a normal mood and affect. His behavior is normal. Judgment and thought content normal.          Assessment & Plan:    HIV/AIDS: continue Tivicay, Descovy, Dapsone for PCP prevention.  Give hep A vaccine today and in 6 months check with HD if he has received Hep B vaccination  Recheck viral load and CD4 count today and he wants to me with pharmacy the next few weeks prior to his  leaving for vacation.  Apparently his mother came into contact with someone who is suffering from a viral infections or hand-foot-and-mouth disease. He is one to make sure that he does not put himself in danger. I'm not particularly worried about them at this point time but if he wants to exercise caution I'm okay with this. I think the main thing is to make sure he ensures proper hand hygiene, and avoids those who might have obvious upper respiratory tract infections. He may consider staying away from his mother's is weakened but I will let that be up to him.   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 He states he was unaware of the different ways he was supposed to take his hydralazine depending on how on how high his BP was  Stress and anxiety: related to his family he feels better now.  I spent greater than 25 minutes with the patient including greater than 50% of time in face to face counsel of the patient re his HIV,re his PCP ESRD, his HTN and in coordination of his care.

## 2016-09-04 NOTE — Patient Instructions (Signed)
Make appt with [facility] pharmacy to review your labs in next 2 weeks or so  Make appt with me in next 2-3 months  GREAT job taking your meds!

## 2016-09-06 LAB — HIV-1 RNA QUANT-NO REFLEX-BLD
HIV 1 RNA Quant: 25 copies/mL — ABNORMAL HIGH
HIV-1 RNA Quant, Log: 1.4 Log copies/mL — ABNORMAL HIGH

## 2016-09-06 LAB — T-HELPER CELL (CD4) - (RCID CLINIC ONLY)
CD4 % Helper T Cell: 9 % — ABNORMAL LOW (ref 33–55)
CD4 T Cell Abs: 140 /uL — ABNORMAL LOW (ref 400–2700)

## 2016-09-15 NOTE — Addendum Note (Signed)
Addended by: [name] on: 09/15/2016 04:53 PM   Modules accepted: Orders

## 2016-09-18 ENCOUNTER — Ambulatory Visit: Payer: Self-pay

## 2016-09-27 ENCOUNTER — Observation Stay (HOSPITAL_COMMUNITY)
Admission: EM | Admit: 2016-09-27 | Discharge: 2016-09-28 | Disposition: A | Discharge status: Home / Self Care | Payer: Medicare Other | Attending: Internal Medicine | Admitting: Internal Medicine

## 2016-09-27 ENCOUNTER — Encounter (HOSPITAL_COMMUNITY): Payer: Self-pay | Admitting: Emergency Medicine

## 2016-09-27 ENCOUNTER — Emergency Department (HOSPITAL_COMMUNITY): Payer: Medicare Other

## 2016-09-27 ENCOUNTER — Ambulatory Visit: Payer: Self-pay

## 2016-09-27 DIAGNOSIS — R519 Headache, unspecified: Secondary | ICD-10-CM | POA: Diagnosis present

## 2016-09-27 DIAGNOSIS — Z86718 Personal history of other venous thrombosis and embolism: Secondary | ICD-10-CM | POA: Insufficient documentation

## 2016-09-27 DIAGNOSIS — J810 Acute pulmonary edema: Secondary | ICD-10-CM

## 2016-09-27 DIAGNOSIS — N2581 Secondary hyperparathyroidism of renal origin: Secondary | ICD-10-CM | POA: Insufficient documentation

## 2016-09-27 DIAGNOSIS — B20 Human immunodeficiency virus [HIV] disease: Secondary | ICD-10-CM | POA: Diagnosis present

## 2016-09-27 DIAGNOSIS — Z9119 Patient's noncompliance with other medical treatment and regimen: Secondary | ICD-10-CM | POA: Insufficient documentation

## 2016-09-27 DIAGNOSIS — J811 Chronic pulmonary edema: Secondary | ICD-10-CM | POA: Insufficient documentation

## 2016-09-27 DIAGNOSIS — Z880 Allergy status to penicillin: Secondary | ICD-10-CM | POA: Insufficient documentation

## 2016-09-27 DIAGNOSIS — N186 End stage renal disease: Secondary | ICD-10-CM | POA: Insufficient documentation

## 2016-09-27 DIAGNOSIS — I517 Cardiomegaly: Secondary | ICD-10-CM | POA: Insufficient documentation

## 2016-09-27 DIAGNOSIS — R0602 Shortness of breath: Secondary | ICD-10-CM | POA: Diagnosis not present

## 2016-09-27 DIAGNOSIS — J45909 Unspecified asthma, uncomplicated: Secondary | ICD-10-CM | POA: Insufficient documentation

## 2016-09-27 DIAGNOSIS — Z881 Allergy status to other antibiotic agents status: Secondary | ICD-10-CM | POA: Insufficient documentation

## 2016-09-27 DIAGNOSIS — Z91018 Allergy to other foods: Secondary | ICD-10-CM | POA: Insufficient documentation

## 2016-09-27 DIAGNOSIS — Z888 Allergy status to other drugs, medicaments and biological substances status: Secondary | ICD-10-CM | POA: Insufficient documentation

## 2016-09-27 DIAGNOSIS — D631 Anemia in chronic kidney disease: Secondary | ICD-10-CM | POA: Insufficient documentation

## 2016-09-27 DIAGNOSIS — I120 Hypertensive chronic kidney disease with stage 5 chronic kidney disease or end stage renal disease: Secondary | ICD-10-CM | POA: Diagnosis not present

## 2016-09-27 DIAGNOSIS — G40909 Epilepsy, unspecified, not intractable, without status epilepticus: Secondary | ICD-10-CM | POA: Insufficient documentation

## 2016-09-27 DIAGNOSIS — R0902 Hypoxemia: Secondary | ICD-10-CM | POA: Insufficient documentation

## 2016-09-27 DIAGNOSIS — E8770 Fluid overload, unspecified: Principal | ICD-10-CM | POA: Insufficient documentation

## 2016-09-27 DIAGNOSIS — R51 Headache: Secondary | ICD-10-CM | POA: Insufficient documentation

## 2016-09-27 DIAGNOSIS — Z992 Dependence on renal dialysis: Secondary | ICD-10-CM | POA: Insufficient documentation

## 2016-09-27 DIAGNOSIS — Z882 Allergy status to sulfonamides status: Secondary | ICD-10-CM | POA: Insufficient documentation

## 2016-09-27 DIAGNOSIS — I161 Hypertensive emergency: Secondary | ICD-10-CM | POA: Diagnosis not present

## 2016-09-27 DIAGNOSIS — I160 Hypertensive urgency: Secondary | ICD-10-CM

## 2016-09-27 DIAGNOSIS — R000 Tachycardia, unspecified: Secondary | ICD-10-CM | POA: Insufficient documentation

## 2016-09-27 DIAGNOSIS — Z8619 Personal history of other infectious and parasitic diseases: Secondary | ICD-10-CM | POA: Insufficient documentation

## 2016-09-27 HISTORY — DX: Unspecified convulsions: R56.9

## 2016-09-27 LAB — CBC WITH DIFFERENTIAL/PLATELET
Basophils Absolute: 0 10*3/uL (ref 0.0–0.1)
Basophils Relative: 1 %
Eosinophils Absolute: 0.3 10*3/uL (ref 0.0–0.7)
Eosinophils Relative: 5 %
HCT: 29.7 % — ABNORMAL LOW (ref 39.0–52.0)
Hemoglobin: 9.8 g/dL — ABNORMAL LOW (ref 13.0–17.0)
Lymphocytes Relative: 10 %
Lymphs Abs: 0.6 10*3/uL — ABNORMAL LOW (ref 0.7–4.0)
MCH: 31 pg (ref 26.0–34.0)
MCHC: 33 g/dL (ref 30.0–36.0)
MCV: 94 fL (ref 78.0–100.0)
Monocytes Absolute: 0.4 10*3/uL (ref 0.1–1.0)
Monocytes Relative: 7 %
Neutro Abs: 4.5 10*3/uL (ref 1.7–7.7)
Neutrophils Relative %: 77 %
Platelets: 173 10*3/uL (ref 150–400)
RBC: 3.16 MIL/uL — ABNORMAL LOW (ref 4.22–5.81)
RDW: 16.9 % — ABNORMAL HIGH (ref 11.5–15.5)
WBC: 5.8 10*3/uL (ref 4.0–10.5)

## 2016-09-27 LAB — BASIC METABOLIC PANEL
Anion gap: 12 (ref 5–15)
BUN: 34 mg/dL — ABNORMAL HIGH (ref 6–20)
CO2: 28 mmol/L (ref 22–32)
Calcium: 9.7 mg/dL (ref 8.9–10.3)
Chloride: 93 mmol/L — ABNORMAL LOW (ref 101–111)
Creatinine, Ser: 9.32 mg/dL — ABNORMAL HIGH (ref 0.61–1.24)
GFR calc Af Amer: 8 mL/min — ABNORMAL LOW (ref >60)
GFR calc non Af Amer: 7 mL/min — ABNORMAL LOW (ref >60)
Glucose, Bld: 106 mg/dL — ABNORMAL HIGH (ref 65–99)
Potassium: 4.4 mmol/L (ref 3.5–5.1)
Sodium: 133 mmol/L — ABNORMAL LOW (ref 135–145)

## 2016-09-27 LAB — BRAIN NATRIURETIC PEPTIDE: B Natriuretic Peptide: 3744 pg/mL — ABNORMAL HIGH (ref 0.0–100.0)

## 2016-09-27 MED ORDER — LEVETIRACETAM 250 MG PO TABS
250.0000 mg | ORAL_TABLET | ORAL | Status: DC
  Filled 2016-09-27: qty 1

## 2016-09-27 MED ORDER — HYDRALAZINE HCL 20 MG/ML IJ SOLN
5.0000 mg | Freq: Once | INTRAMUSCULAR | Status: AC
  Administered 2016-09-27: 5 mg via INTRAVENOUS
  Filled 2016-09-27: qty 1

## 2016-09-27 MED ORDER — LABETALOL HCL 5 MG/ML IV SOLN: 10.0000 mg | Freq: Once | INTRAVENOUS | Status: DC

## 2016-09-27 MED ORDER — PROCHLORPERAZINE EDISYLATE 5 MG/ML IJ SOLN
10.0000 mg | Freq: Once | INTRAMUSCULAR | Status: AC
  Administered 2016-09-27: 10 mg via INTRAVENOUS
  Filled 2016-09-27: qty 2

## 2016-09-27 MED ORDER — EMTRICITABINE-TENOFOVIR AF 200-25 MG PO TABS
1.0000 | ORAL_TABLET | Freq: Every day | ORAL | Status: DC
  Administered 2016-09-27: 1 via ORAL
  Filled 2016-09-27 (×2): qty 1

## 2016-09-27 MED ORDER — HYDRALAZINE HCL 100 MG PO TABS: 100.0000 mg | ORAL_TABLET | ORAL | Status: DC

## 2016-09-27 MED ORDER — ONDANSETRON HCL 4 MG/2ML IJ SOLN: 4.0000 mg | Freq: Four times a day (QID) | INTRAMUSCULAR | Status: DC | PRN

## 2016-09-27 MED ORDER — ONDANSETRON HCL 4 MG PO TABS: 4.0000 mg | ORAL_TABLET | Freq: Four times a day (QID) | ORAL | Status: DC | PRN

## 2016-09-27 MED ORDER — DIPHENHYDRAMINE HCL 50 MG/ML IJ SOLN
25.0000 mg | Freq: Once | INTRAMUSCULAR | Status: AC
  Administered 2016-09-27: 25 mg via INTRAVENOUS
  Filled 2016-09-27: qty 1

## 2016-09-27 MED ORDER — DIPHENHYDRAMINE HCL 25 MG PO CAPS: 25.0000 mg | ORAL_CAPSULE | Freq: Four times a day (QID) | ORAL | Status: DC | PRN

## 2016-09-27 MED ORDER — LEVETIRACETAM 500 MG PO TABS
500.0000 mg | ORAL_TABLET | Freq: Every day | ORAL | Status: DC
  Administered 2016-09-27: 500 mg via ORAL
  Filled 2016-09-27 (×2): qty 1

## 2016-09-27 MED ORDER — FAMOTIDINE 20 MG PO TABS: 20.0000 mg | ORAL_TABLET | Freq: Two times a day (BID) | ORAL | Status: DC | PRN

## 2016-09-27 MED ORDER — CLONIDINE HCL 0.2 MG PO TABS
0.2000 mg | ORAL_TABLET | Freq: Once | ORAL | Status: AC
  Administered 2016-09-27: 0.2 mg via ORAL
  Filled 2016-09-27: qty 1

## 2016-09-27 MED ORDER — ISOSORBIDE MONONITRATE ER 60 MG PO TB24
60.0000 mg | ORAL_TABLET | Freq: Every day | ORAL | Status: DC
  Administered 2016-09-27: 60 mg via ORAL
  Filled 2016-09-27: qty 1
  Filled 2016-09-27: qty 2

## 2016-09-27 MED ORDER — HEPARIN SODIUM (PORCINE) 5000 UNIT/ML IJ SOLN
5000.0000 [IU] | Freq: Three times a day (TID) | INTRAMUSCULAR | Status: DC
  Filled 2016-09-27: qty 1

## 2016-09-27 MED ORDER — LOSARTAN POTASSIUM 50 MG PO TABS
100.0000 mg | ORAL_TABLET | Freq: Every day | ORAL | Status: DC
  Administered 2016-09-27: 100 mg via ORAL
  Filled 2016-09-27 (×2): qty 2

## 2016-09-27 MED ORDER — ALBUTEROL SULFATE (2.5 MG/3ML) 0.083% IN NEBU
5.0000 mg | INHALATION_SOLUTION | Freq: Once | RESPIRATORY_TRACT | Status: AC
  Administered 2016-09-27: 5 mg via RESPIRATORY_TRACT
  Filled 2016-09-27: qty 6

## 2016-09-27 MED ORDER — HYDROXYZINE HCL 25 MG PO TABS: 25.0000 mg | ORAL_TABLET | Freq: Three times a day (TID) | ORAL | Status: DC | PRN

## 2016-09-27 MED ORDER — CLONIDINE HCL 0.2 MG/24HR TD PTWK: 0.2000 mg | MEDICATED_PATCH | TRANSDERMAL | Status: DC

## 2016-09-27 MED ORDER — GABAPENTIN 100 MG PO CAPS
100.0000 mg | ORAL_CAPSULE | Freq: Every day | ORAL | Status: DC
  Administered 2016-09-27: 100 mg via ORAL
  Filled 2016-09-27 (×2): qty 1

## 2016-09-27 MED ORDER — DARBEPOETIN ALFA 100 MCG/0.5ML IJ SOSY
100.0000 ug | PREFILLED_SYRINGE | INTRAMUSCULAR | Status: DC
  Administered 2016-09-28: 100 ug via INTRAVENOUS
  Filled 2016-09-27: qty 0.5

## 2016-09-27 MED ORDER — DOLUTEGRAVIR SODIUM 50 MG PO TABS
50.0000 mg | ORAL_TABLET | Freq: Every day | ORAL | Status: DC
  Administered 2016-09-27: 50 mg via ORAL
  Filled 2016-09-27 (×2): qty 1

## 2016-09-27 MED ORDER — GUAIFENESIN ER 600 MG PO TB12
600.0000 mg | ORAL_TABLET | Freq: Two times a day (BID) | ORAL | Status: DC
  Administered 2016-09-27 (×2): 600 mg via ORAL
  Filled 2016-09-27 (×3): qty 1

## 2016-09-27 MED ORDER — ACETAMINOPHEN 650 MG RE SUPP: 650.0000 mg | Freq: Four times a day (QID) | RECTAL | Status: DC | PRN

## 2016-09-27 MED ORDER — TOPIRAMATE 25 MG PO TABS
50.0000 mg | ORAL_TABLET | Freq: Two times a day (BID) | ORAL | Status: DC
Start: 2016-09-27 — End: 2016-09-28
  Administered 2016-09-27 (×2): 50 mg via ORAL
  Filled 2016-09-27 (×3): qty 2

## 2016-09-27 MED ORDER — TRIAMCINOLONE ACETONIDE 0.5 % EX OINT: 1.0000 "application " | TOPICAL_OINTMENT | Freq: Two times a day (BID) | CUTANEOUS | Status: DC | PRN

## 2016-09-27 MED ORDER — IPRATROPIUM-ALBUTEROL 0.5-2.5 (3) MG/3ML IN SOLN: 3.0000 mL | RESPIRATORY_TRACT | Status: DC | PRN

## 2016-09-27 MED ORDER — HYDRALAZINE HCL 50 MG PO TABS
100.0000 mg | ORAL_TABLET | Freq: Three times a day (TID) | ORAL | Status: DC
  Administered 2016-09-27: 100 mg via ORAL
  Filled 2016-09-27 (×2): qty 2

## 2016-09-27 MED ORDER — ACETAMINOPHEN 325 MG PO TABS: 650.0000 mg | ORAL_TABLET | Freq: Four times a day (QID) | ORAL | Status: DC | PRN

## 2016-09-27 MED ORDER — NITROGLYCERIN 2 % TD OINT
1.0000 [in_us] | TOPICAL_OINTMENT | Freq: Once | TRANSDERMAL | Status: AC
  Administered 2016-09-27: 1 [in_us] via TOPICAL
  Filled 2016-09-27: qty 1

## 2016-09-27 MED ORDER — MAGNESIUM SULFATE 2 GM/50ML IV SOLN
2.0000 g | Freq: Once | INTRAVENOUS | Status: AC
  Administered 2016-09-27: 2 g via INTRAVENOUS
  Filled 2016-09-27: qty 50

## 2016-09-27 NOTE — H&P (Signed)
History and Physical    Jim Wyatt TLX:[id number] DOB: August 25, 1980 DOA: 09/27/2016  Referring MD/NP/PA: Dr. Thayer Jew PCP: [name], MD  Patient coming from:  home  Chief Complaint: Shortness breath  HPI: Jim Wyatt is a 36 y.o. male with medical history significant of  ESRD on HD(T/T/Sat), HIV, uncontrolled HTN, DVT in 2015 not on anticoagulation, seizure disorder, and asthma; who presents with complaints of shortness of breath starting 2 days ago. Symptoms seem to be worsened with the patient lying down. Denies having any significant chest pain, fever, chills, nausea, or vomiting. He reports going to hemodialysis yesterday and had a complete session. Other associated symptoms include a mildly productive cough and reports of a headache. Patient also reports not being on his medications of clonidine for at least 1 week.  ED Course: Upon admission into the emergency department patient was noted to be afebrile, pulse 112-128, respirations 11-22, blood pressure elevated up to 209/119, and O2 saturations as low as 89% requiring 2 L of nasal cannula oxygen to maintain O2 sats. Labs revealed will go numb 0.8, sodium 133, potassium 4.4, BUN 34, creatinine 9.32. Chest x-ray showing cardiomegaly with moderate diffuse pulmonary edema.   Review of Systems: Review of Systems  Constitutional: Positive for malaise/fatigue. Negative for chills, fever and weight loss.  HENT: Negative for ear discharge and ear pain.   Eyes: Negative for double vision and photophobia.  Respiratory: Positive for cough and shortness of breath.   Cardiovascular: Negative for chest pain and leg swelling.  Gastrointestinal: Negative for abdominal pain, nausea and vomiting.  Genitourinary: Negative for frequency and hematuria.  Musculoskeletal: Negative for falls.  Skin: Positive for itching.  Neurological: Positive for headaches. Negative for seizures.  Psychiatric/Behavioral: Negative for substance  abuse.  All other systems reviewed and are negative.   Past Medical History:  Diagnosis Date  . Asthma   . CAP (community acquired pneumonia) 10/2013  . Cellulitis 11/2013   LLE  . DVT (deep venous thrombosis) ([place]) 10/2013   BLE  . 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  Went on HD Dec 2016.   [name] Fever   . Headache   . HIV infection ([place]) dx'd 10/2013  . Hypertension   . Leg pain   . Multiple environmental allergies    "trees, dogs, cats"  . Multiple food allergies   . Seizures ([place])    last seizure per pt in Jan  . Shingles < 2010    Past Surgical History:  Procedure Laterality Date  . AV FISTULA PLACEMENT Left 03/03/2015   Procedure: Creation of Left Arm RADIOCEPHALIC ARTERIOVENOUS FISTULA ;  Surgeon: Mal Misty, MD;  Location: [place];  Service: Vascular;  Laterality: Left;  . COLONOSCOPY N/A 03/18/2016   Procedure: COLONOSCOPY;  Surgeon: Manus Gunning, MD;  Location: [place];  Service: Gastroenterology;  Laterality: N/A;  . FEMUR FRACTURE SURGERY Right 09/2001  . FRACTURE SURGERY    . I&D EXTREMITY Right 12/18/2014   Procedure: IRRIGATION AND DEBRIDEMENT RIGHT LOWER EXTREMITY;  Surgeon: Ralene Ok, MD;  Location: [place];  Service: General;  Laterality: Right;  . INSERTION OF DIALYSIS CATHETER  2016  . LAPAROTOMY N/A 12/28/2015   Procedure: EXPLORATORY LAPAROTOMY BOWEL RESECTION ILEOCECECTOMY;  Surgeon: Clovis Riley, MD;  Location: [place];  Service: General;  Laterality: N/A;     reports that he has never smoked. He has never used smokeless tobacco. He reports that he does not drink alcohol or use drugs.  Allergies  Allergen Reactions  . Bactrim [Sulfamethoxazole-Trimethoprim] Other (See Comments)    Renal failure  . Onion Hives and Other (See Comments)    Per allergy  test  . Procardia [Nifedipine] Hives, Shortness Of Breath and Itching  . Reglan [Metoclopramide] Other (See Comments)    Causes ticks/ jerks  . Vancomycin Other (See Comments)    Patient states vancomycin caused kidney injury  . Metoprolol Hives  . Amlodipine Palpitations and Other (See Comments)    Causes fatigue  . Augmentin [Amoxicillin-Pot Clavulanate] Hives    Family History  Problem Relation Age of Onset  . Hypertension Mother   . Diabetes Maternal Grandmother   . Kidney disease Maternal Grandmother     Prior to Admission medications   Medication Sig Start Date End Date Taking? Authorizing Provider  acetaminophen (TYLENOL) 500 MG tablet Take 1,000 mg by mouth every 6 (six) hours as needed (pain).   Yes [provider]  albuterol (PROVENTIL HFA;VENTOLIN HFA) 108 (90 Base) MCG/ACT inhaler Inhale 2 puffs into the lungs every 6 (six) hours as needed for wheezing or shortness of breath. 06/22/16  Yes Smiley Houseman, MD  cloNIDine (CATAPRES - DOSED IN MG/24 HR) 0.2 mg/24hr patch Place 1 patch (0.2 mg total) onto the skin once a week. Patient taking differently: Place 0.2 mg onto the skin every Sunday.  08/13/16  Yes Charlesetta Shanks, MD  dapsone 100 MG tablet Take 1 tablet (100 mg total) by mouth daily. 07/28/16  Yes Tommy Medal, Lavell Islam, MD  diphenhydrAMINE (BENADRYL) 25 mg capsule Take 1 capsule (25 mg total) by mouth every 6 (six) hours as needed. Patient taking differently: Take 50 mg by mouth daily.  08/03/16  Yes Kirichenko, Tatyana, PA-C  dolutegravir (TIVICAY) 50 MG tablet Take 1 tablet (50 mg total) by mouth daily. 06/02/16  Yes Tommy Medal, Lavell Islam, MD  emtricitabine-tenofovir AF (DESCOVY) 200-25 MG tablet Take 1 tablet by mouth daily. 06/02/16  Yes Tommy Medal, Lavell Islam, MD  famotidine (PEPCID) 20 MG tablet Take 1 tablet (20 mg total) by mouth 2 (two) times daily. Patient taking differently: Take 20 mg by mouth 2 (two) times daily as needed for heartburn (acid reflux).   08/03/16  Yes Kirichenko, Tatyana, PA-C  gabapentin (NEURONTIN) 100 MG capsule Take 1 capsule (100 mg total) by mouth 3 (three) times daily. Patient taking differently: Take 100 mg by mouth daily.  07/05/16  Yes [name], MD  hydrALAZINE (APRESOLINE) 100 MG tablet Take 100-200 mg by mouth See admin instructions. Take 1 tablet (100 mg) by mouth three times daily, take 2 tablets (200 mg) for SBP >190   Yes [provider]  hydrALAZINE (APRESOLINE) 50 MG tablet Take 2 tablets (100 mg total) by mouth 3 (three) times daily. 06/22/16  Yes Smiley Houseman, MD  hydrOXYzine (ATARAX/VISTARIL) 25 MG tablet Take 1 tablet (25 mg total) by mouth 3 (three) times daily as needed for itching. Patient taking differently: Take 25 mg by mouth 2 (two) times daily.  07/05/16  Yes [name], MD  isosorbide mononitrate (IMDUR) 60 MG 24 hr tablet Take 1 tablet (60 mg total) by mouth daily. 08/23/16  Yes Valinda Party, DO  levETIRAcetam (KEPPRA) 250 MG tablet Take 1 tablet (250 mg) by mouth on Tuesday, Thursday, Saturday at 7pm Patient  taking differently: Take 250 mg by mouth See admin instructions. Take one tablet (250 mg) by mouth every Tuesday, Thursday and Saturday at 7 pm 03/13/16  Yes Cameron Sprang, MD  levETIRAcetam (KEPPRA) 500 MG tablet Take 500 mg by mouth daily.   Yes [provider]  losartan (COZAAR) 100 MG tablet Take 100 mg by mouth daily.    Yes [provider]  topiramate (TOPAMAX) 50 MG tablet Take 1 tablet (50 mg total) by mouth 2 (two) times daily. 06/22/16  Yes Smiley Houseman, MD  triamcinolone ointment (KENALOG) 0.5 % Apply 1 application topically 2 (two) times daily. Patient taking differently: Apply 1 application topically 2 (two) times daily as needed (rash/ itching).  07/19/16  Yes [name], MD  fluticasone (FLONASE) 50 MCG/ACT nasal spray Place 2 sprays into both nostrils daily. Patient not taking: Reported on 08/19/2016  08/02/16   Horton, Barbette Hair, MD  pantoprazole (PROTONIX) 40 MG tablet Take 1 tablet (40 mg total) by mouth daily. Patient not taking: Reported on 08/19/2016 06/22/16   [place] Bing, DO  sodium chloride (OCEAN) 0.65 % SOLN nasal spray Place 1 spray into both nostrils as needed for congestion. Patient not taking: Reported on 08/19/2016 08/02/16   Horton, Barbette Hair, MD  sucralfate (CARAFATE) 1 GM/10ML suspension Take 10 mLs (1 g total) by mouth 4 (four) times daily -  with meals and at bedtime. Patient not taking: Reported on 08/14/2016 06/22/16   [place] Bing, DO    Physical Exam:  Constitutional: Young male who appears to be chronically ill in mild respiratory distress. Vitals:   09/27/16 0429 09/27/16 0430 09/27/16 0522 09/27/16 0523  BP:  (!) 187/128 (!) 189/32   Pulse: (!) 119 (!) 119 (!) 116   Resp: 20 11 (!) 22   Temp:      TempSrc:      SpO2:  98% (!) 89% 94%  Weight:      Height:       Eyes: PERRL, lids and conjunctivae normal ENMT: Mucous membranes are moist. Posterior pharynx clear of any exudate or lesions.   Neck: normal, supple, no masses, no thyromegaly Respiratory: Mildly tachypneic with positive crackles appreciated throughout both lung fields no significant wheezes or rhonchi appreciated. Cardiovascular: Tachycardic, no murmurs / rubs / gallops. No extremity edema. 2+ pedal pulses. No carotid bruits.  Abdomen: no tenderness, no masses palpated. No hepatosplenomegaly. Bowel sounds positive.  Musculoskeletal: no clubbing / cyanosis. No joint deformity upper and lower extremities. Good ROM, no contractures. Normal muscle tone.  Skin: no rashes, lesions, ulcers. No induration Neurologic: CN 2-12 grossly intact. Sensation intact, DTR normal. Strength 5/5 in all 4.  Psychiatric: Normal judgment and insight. Alert and oriented x 3. Normal mood.     Labs on Admission: I have personally reviewed following labs and imaging studies  CBC:  Recent Labs Lab  09/27/16 0227  WBC 5.8  NEUTROABS 4.5  HGB 9.8*  HCT 29.7*  MCV 94.0  PLT 413   Basic Metabolic Panel:  Recent Labs Lab 09/27/16 0227  NA 133*  K 4.4  CL 93*  CO2 28  GLUCOSE 106*  BUN 34*  CREATININE 9.32*  CALCIUM 9.7   GFR: Estimated Creatinine Clearance: 10.5 mL/min (A) (by C-G formula based on SCr of 9.32 mg/dL (H)). Liver Function Tests: No results for input(s): AST, ALT, ALKPHOS, BILITOT, PROT, ALBUMIN in the last 168 hours. No results for input(s): LIPASE, AMYLASE in the last 168 hours. No  results for input(s): AMMONIA in the last 168 hours. Coagulation Profile: No results for input(s): INR, PROTIME in the last 168 hours. Cardiac Enzymes: No results for input(s): CKTOTAL, CKMB, CKMBINDEX, TROPONINI in the last 168 hours. BNP (last 3 results) No results for input(s): PROBNP in the last 8760 hours. HbA1C: No results for input(s): HGBA1C in the last 72 hours. CBG: No results for input(s): GLUCAP in the last 168 hours. Lipid Profile: No results for input(s): CHOL, HDL, LDLCALC, TRIG, CHOLHDL, LDLDIRECT in the last 72 hours. Thyroid Function Tests: No results for input(s): TSH, T4TOTAL, FREET4, T3FREE, THYROIDAB in the last 72 hours. Anemia Panel: No results for input(s): VITAMINB12, FOLATE, FERRITIN, TIBC, IRON, RETICCTPCT in the last 72 hours. Urine analysis:    Component Value Date/Time   COLORURINE YELLOW 06/02/2016 0753   APPEARANCEUR HAZY (A) 06/02/2016 0753   LABSPEC 1.014 06/02/2016 0753   PHURINE 9.0 (H) 06/02/2016 0753   GLUCOSEU >=500 (A) 06/02/2016 0753   HGBUR NEGATIVE 06/02/2016 0753   BILIRUBINUR NEGATIVE 06/02/2016 0753   BILIRUBINUR Negative 01/19/2016 0934   KETONESUR NEGATIVE 06/02/2016 0753   PROTEINUR >=300 (A) 06/02/2016 0753   UROBILINOGEN 0.2 01/19/2016 0934   UROBILINOGEN 0.2 12/18/2014 1925   NITRITE NEGATIVE 06/02/2016 0753   LEUKOCYTESUR NEGATIVE 06/02/2016 0753   Sepsis Labs: No results found for this or any previous  visit (from the past 240 hour(s)).   Radiological Exams on Admission: Dg Chest 2 View  Result Date: 09/27/2016 CLINICAL DATA:  Initial evaluation for acute shortness of breath, history of asthma. EXAM: CHEST  2 VIEW COMPARISON:  Prior radiograph from 08/19/2016. FINDINGS: Prominent cardiomegaly, similar to previous. Mediastinal silhouette within normal limits. Lungs normally inflated. Diffuse vascular congestion with interstitial prominence, most consistent with pulmonary edema. No pleural effusion. No definite focal infiltrates, although these would be somewhat difficult to exclude. No pneumothorax. No acute osseus abnormality. IMPRESSION: Cardiomegaly with moderate diffuse pulmonary interstitial edema. Electronically Signed   By: Jeannine Boga M.D.   On: 09/27/2016 02:56    EKG: Independently reviewed. Sinus tachycardia  Assessment/Plan Fluid overload/HF with hypoxia: Acute. Patient presents with progressive worsening of shortness of breath. Chest x-ray showing diffuse pulmonary edema and O2 saturations as low as 89% on room air. Last echocardiogram showing EF of 55-60% on 08/20/2016. Lower suspicion for pulmonary embolus at this time. Patient seen have chronically elevated d-dimer in the past with negative CT angiograms. - Admit to stepdown bed - Strict I&O's and daily weights  - Continuous pulse oximetry with nasal cannula oxygen to keep O2 sats greater than 92% - Left voicemail on nephrology line for need of dialysis  Hypertensive urgency: Blood pressures noted to be elevated as 209/119 on admission. Patient given nitroglycerin place as well as home blood pressure medications. - Continue clonidine, losartan,isosorbide mononitrate, and hydralazine  Headache: Acute. question if secondary to elevated blood. pressure -may warrant CT scan if symptoms persist  Anemia of chronic kidney disease: Hemoglobin 9.8 on admission which appears near patient's baseline - Continue to  monitor  Seizure disorder - Continue Keppra  H/O DVT not currently on anticoagulation.  HIV: Patient's last CD4 count last was done 140 on 09/04/16. - Continue HARRT    DVT prophylaxis: Heparin Code Status:full  Family Communication: No family present at bedside Disposition Plan:  discharge home in 1-2  days Consults called: none  Admission status: Observation  Norval Morton MD [facility] Pager [phone]   If 7PM-7AM, please contact night-coverage [email or web address] Password [facility]  09/27/2016,  5:48 AM

## 2016-09-27 NOTE — ED Notes (Signed)
Patient was on room air c/o increased sob sat decreased to 89% o2 increased to 2l/[place] sat up ot 94%

## 2016-09-27 NOTE — Procedures (Signed)
Patient seen on Hemodialysis. QB 400, UF goal 4L Treatment adjusted as needed.  Elmarie Shiley MD [facility]. Office # [phone] Pager # [phone] 3:19 PM

## 2016-09-27 NOTE — ED Notes (Signed)
Patient is lying on stretcher much calmer , appears to be sleeping

## 2016-09-27 NOTE — ED Notes (Signed)
Burnis Medin, MD notified of patient's bp 143/91 and heart rate 108. MD still advising labetalol at this time.

## 2016-09-27 NOTE — ED Notes (Signed)
Went to put ntg paste on and patient refused, states he doesn't take or use NTG makes him feel to bad.

## 2016-09-27 NOTE — ED Notes (Signed)
Patient states he is feeling worse , getting anxious. C/o feeling hot

## 2016-09-27 NOTE — Progress Notes (Signed)
Jim Wyatt has been seen and evaluated by me , he is a 36 year old male with a history of end-stage renal disease on hemodialysis who presents with a shortness of breath was found to be in pulmonary edema requires urgent hemodialysis, nephrology has been alerted and he will be dialyzed today currently is chest pain-free with a shortness of breath with minimal exertion no other complaints.

## 2016-09-27 NOTE — ED Notes (Signed)
Patient refused heparin injection.

## 2016-09-27 NOTE — ED Notes (Signed)
Pt back in room. No distress observed. 

## 2016-09-27 NOTE — ED Notes (Signed)
Per [place], PA hydralazine to be held before dialysis.

## 2016-09-27 NOTE — ED Notes (Signed)
Patient transported to X-ray 

## 2016-09-27 NOTE — ED Provider Notes (Signed)
[facility] DEPT Provider Note   CSN: [id number] Arrival date & time: 09/27/16  0200  By signing my name below, I, Ny'Kea Lewis, attest that this documentation has been prepared under the direction and in the presence of Horton, Barbette Hair, MD. Electronically Signed: Lise Auer, ED Scribe. 09/27/16. 3:13 AM.  History   Chief Complaint Chief Complaint  Patient presents with  . Shortness of Breath   The history is provided by the patient. No language interpreter was used.    HPI HPI Comments: IZEK CORVINO is a 36 y.o. male with a PMHx of asthma, CAP, DVT, ESRD, HTN, and HIV, brought in by ambulance, who presents to the Emergency Department complaining of sudden onset, gradually worsening, shortness of breath that began yesterday evening. He notes associated cough with mucus production, persistent "pressure" headache and elevated blood pressures that last few days. Pt has dialysis TTS and reports he received his full treatment yesterday. He states he had some changes in his medication schedule to refills and states he has not had his Clonidine patch in over one week. Denies fever, visual disturbance, leg swelling, diarrhea, nausea, vomiting, numbness, or weakness of extremities. Patient reports that his "old" dry weight was 68.5 kg. Weight again dialysis today was 68.6 kg. Patient reports that he has previously been told "you might need oxygen" but is not on home oxygen.  Past Medical History:  Diagnosis Date  . Asthma   . CAP (community acquired pneumonia) 10/2013  . Cellulitis 11/2013   LLE  . DVT (deep venous thrombosis) ([place]) 10/2013   BLE  . ESRD on hemodialysis ([place]) 01/2015   AKI with nephrotic syndrome (16gm proteinuria at time of biopsy, from collapsing FSG). 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  Went on HD Dec 2016.   [name] Fever   . Headache   . HIV infection ([place]) dx'd 10/2013  . Hypertension   . Leg pain   .  Multiple environmental allergies    "trees, dogs, cats"  . Multiple food allergies   . Seizures ([place])    last seizure per pt in Jan  . Shingles < 2010    Patient Active Problem List   Diagnosis Date Noted  . Volume overload 08/19/2016  . Acne-like skin bumps 07/19/2016  . Neuropathic pain 07/05/2016  . Pruritus 07/05/2016  . Anasarca   . Hypervolemia   . Chronic diarrhea   . ESRD on hemodialysis ([place])   . Acute pulmonary edema (HCC)   . SIRS (systemic inflammatory response syndrome) ([place]) 05/30/2016  . Acute respiratory failure ([place]) 05/30/2016  . 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 Noncompliance   . End stage renal disease ([place])   . Chest pressure   . Palliative care encounter   . Goals of care, counseling/discussion   . Shortness of breath 05/12/2016  . Dyspnea   . Hypertensive emergency   . Pericardial effusion   . Tachycardia   . HIV disease ([place])   . OSA (obstructive sleep apnea) 04/19/2016  . Hospital discharge follow-up 04/19/2016  . Depression 03/24/2016  . Breast lump or mass   . Rectal bleeding   . GI bleed 03/15/2016  . Hyperkalemia   . New onset seizure ([place]) 03/13/2016  . Symptomatic anemia 02/01/2016  . Dysuria 01/18/2016  . Hematochezia 01/09/2016  . Postoperative anemia due to acute blood loss 01/09/2016  . Seizure disorder ([place]) 12/29/2015  .  Intussusception ([place]) 12/28/2015  . Hypertension 12/28/2015  . Otitis externa of both ears 11/21/2015  . AIDS (acquired immune deficiency syndrome) ([place])   . Meningitis   . Cephalalgia   . Blurry vision, bilateral   . Headache   . Hypertensive urgency   . New onset of headache in immunocompromised patient ([place]) 07/06/2015  . ESRD on dialysis ([place]) 04/27/2015  . CAP (community acquired pneumonia) 02/25/2015  . ESRD (end stage renal disease) ([place]) 02/16/2015  . History of DVT (deep vein thrombosis) 01/12/2015  . Candidal esophagitis ([place])   . Constipation   . Nausea  without vomiting 12/20/2014  . Abscess of right thigh 12/19/2014  . Chronic kidney disease (CKD), stage IV (severe) ([place]) 12/19/2014  . 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 Anemia due to end stage renal disease ([place]) 12/19/2014  . Hyponatremia 12/19/2014  . AIDS ([place])   . Community acquired pneumonia 07/30/2014  . Anemia of chronic disease 07/30/2014  . Acute on chronic renal failure ([place])   . 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 CMV (cytomegalovirus infection) ([place]) 12/07/2013  . Protein-calorie malnutrition, severe ([place]) 12/06/2013  . Human immunodeficiency virus (HIV) disease ([place]) 11/27/2013  . 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 DVT (deep venous thrombosis) ([place]) 11/24/2013  . Asthma, chronic 11/24/2013  . Acquired immune deficiency syndrome ([place]) 11/14/2013  . AKI (acute kidney injury) ([place]) 11/11/2013  . Elevated liver enzymes 11/09/2013  . History of shingles 11/09/2013  . Obesity 11/09/2013  . Abdominal pain 11/07/2013  . Asthma    Past Surgical History:  Procedure Laterality Date  . AV FISTULA PLACEMENT Left 03/03/2015   Procedure: Creation of Left Arm RADIOCEPHALIC ARTERIOVENOUS FISTULA ;  Surgeon: Mal Misty, MD;  Location: [facility];  Service: Vascular;  Laterality: Left;  . COLONOSCOPY N/A 03/18/2016   Procedure: COLONOSCOPY;  Surgeon: Manus Gunning, MD;  Location: [facility];  Service: Gastroenterology;  Laterality: N/A;  . FEMUR FRACTURE SURGERY Right 09/2001  . FRACTURE SURGERY    . I&D EXTREMITY Right 12/18/2014   Procedure: IRRIGATION AND DEBRIDEMENT RIGHT LOWER EXTREMITY;  Surgeon: Ralene Ok, MD;  Location: [facility];  Service: General;  Laterality: Right;  . INSERTION OF DIALYSIS CATHETER  2016  . LAPAROTOMY N/A 12/28/2015   Procedure: EXPLORATORY LAPAROTOMY BOWEL RESECTION ILEOCECECTOMY;  Surgeon: Clovis Riley, MD;  Location: [facility];  Service: General;  Laterality: N/A;    Home  Medications    Prior to Admission medications   Medication Sig Start Date End Date Taking? Authorizing Provider  acetaminophen (TYLENOL) 500 MG tablet Take 1,000 mg by mouth every 6 (six) hours as needed (pain).    [provider]  albuterol (PROVENTIL HFA;VENTOLIN HFA) 108 (90 Base) MCG/ACT inhaler Inhale 2 puffs into the lungs every 6 (six) hours as needed for wheezing or shortness of breath. 06/22/16   Smiley Houseman, MD  cloNIDine (CATAPRES - DOSED IN MG/24 HR) 0.1 mg/24hr patch Place 0.2 mg onto the skin every Sunday.    [provider]  cloNIDine (CATAPRES - DOSED IN MG/24 HR) 0.2 mg/24hr patch Place 1 patch (0.2 mg total) onto the skin once a week. Patient taking differently: Place 0.2 mg onto the skin every Sunday.  08/13/16   Charlesetta Shanks, MD  dapsone 100 MG tablet Take 1 tablet (100 mg total) by mouth daily. 07/28/16   Truman Hayward, MD  diphenhydrAMINE (BENADRYL) 25 mg capsule Take 1 capsule (25 mg total) by mouth every  6 (six) hours as needed. Patient taking differently: Take 50 mg by mouth daily.  08/03/16   Kirichenko, Tatyana, PA-C  dolutegravir (TIVICAY) 50 MG tablet Take 1 tablet (50 mg total) by mouth daily. 06/02/16   Truman Hayward, MD  emtricitabine-tenofovir AF (DESCOVY) 200-25 MG tablet Take 1 tablet by mouth daily. 06/02/16   Truman Hayward, MD  famotidine (PEPCID) 20 MG tablet Take 1 tablet (20 mg total) by mouth 2 (two) times daily. Patient taking differently: Take 20 mg by mouth 2 (two) times daily as needed for heartburn (acid reflux).  08/03/16   Kirichenko, Tatyana, PA-C  fluticasone (FLONASE) 50 MCG/ACT nasal spray Place 2 sprays into both nostrils daily. Patient not taking: Reported on 08/19/2016 08/02/16   Horton, Barbette Hair, MD  gabapentin (NEURONTIN) 100 MG capsule Take 1 capsule (100 mg total) by mouth 3 (three) times daily. Patient taking differently: Take 100 mg by mouth daily.  07/05/16   [name], MD  hydrALAZINE  (APRESOLINE) 100 MG tablet Take 100-200 mg by mouth See admin instructions. Take 1 tablet (100 mg) by mouth three times daily, take 2 tablets (200 mg) for SBP >190    [provider]  hydrALAZINE (APRESOLINE) 50 MG tablet Take 2 tablets (100 mg total) by mouth 3 (three) times daily. 06/22/16   Smiley Houseman, MD  hydrOXYzine (ATARAX/VISTARIL) 25 MG tablet Take 1 tablet (25 mg total) by mouth 3 (three) times daily as needed for itching. Patient taking differently: Take 25 mg by mouth 2 (two) times daily.  07/05/16   [name], MD  isosorbide mononitrate (IMDUR) 60 MG 24 hr tablet Take 1 tablet (60 mg total) by mouth daily. 08/23/16   Valinda Party, DO  levETIRAcetam (KEPPRA) 250 MG tablet Take 1 tablet (250 mg) by mouth on Tuesday, Thursday, Saturday at 7pm Patient taking differently: Take 250 mg by mouth See admin instructions. Take one tablet (250 mg) by mouth every Tuesday, Thursday and Saturday at 7 pm 03/13/16   Cameron Sprang, MD  levETIRAcetam (KEPPRA) 500 MG tablet Take 500 mg by mouth daily.    [provider]  losartan (COZAAR) 100 MG tablet Take 100 mg by mouth daily.     [provider]  pantoprazole (PROTONIX) 40 MG tablet Take 1 tablet (40 mg total) by mouth daily. Patient not taking: Reported on 08/19/2016 06/22/16   [place] Bing, DO  sodium chloride (OCEAN) 0.65 % SOLN nasal spray Place 1 spray into both nostrils as needed for congestion. Patient not taking: Reported on 08/19/2016 08/02/16   Horton, Barbette Hair, MD  sucralfate (CARAFATE) 1 GM/10ML suspension Take 10 mLs (1 g total) by mouth 4 (four) times daily -  with meals and at bedtime. Patient not taking: Reported on 08/14/2016 06/22/16   [place] Bing, DO  topiramate (TOPAMAX) 50 MG tablet Take 1 tablet (50 mg total) by mouth 2 (two) times daily. 06/22/16   Smiley Houseman, MD  triamcinolone ointment (KENALOG) 0.5 % Apply 1 application topically 2 (two) times daily. Patient  taking differently: Apply 1 application topically 2 (two) times daily as needed (rash/ itching).  07/19/16   [name], MD    Family History Family History  Problem Relation Age of Onset  . Hypertension Mother   . Diabetes Maternal Grandmother   . Kidney disease Maternal Grandmother    Social History Social History  Substance Use Topics  . Smoking status: Never Smoker  .  Smokeless tobacco: Never Used  . Alcohol use No     Comment: I stopped drinking along time ago "   Allergies   Bactrim [sulfamethoxazole-trimethoprim]; Onion; Procardia [nifedipine]; Reglan [metoclopramide]; Vancomycin; Metoprolol; Amlodipine; and Augmentin [amoxicillin-pot clavulanate]  Review of Systems Review of Systems  Constitutional: Negative for fever.  Eyes: Negative for visual disturbance.  Respiratory: Positive for cough and shortness of breath.   Cardiovascular: Negative for chest pain and leg swelling.  Gastrointestinal: Negative for diarrhea, nausea and vomiting.  Neurological: Positive for headaches. Negative for weakness and numbness.  All other systems reviewed and are negative.   Physical Exam Updated Vital Signs BP (!) 187/133 (BP Location: Right Arm)   Pulse (!) 119   Temp 98.4 F (36.9 C) (Oral)   Resp 20   Ht 5\' 7"  (1.702 m)   Wt 68.6 kg (151 lb 3.8 oz)   SpO2 98%   BMI 23.69 kg/m   Physical Exam  Constitutional: He is oriented to person, place, and time. No distress.  Chronically ill-appearing, no acute distress  HENT:  Head: Normocephalic and atraumatic.  Cardiovascular: Regular rhythm and normal heart sounds.   No murmur heard. Tachycardia  Pulmonary/Chest: Effort normal. No respiratory distress. He has no wheezes.  Crackles bilateral bases  Abdominal: Soft. Bowel sounds are normal. There is no tenderness. There is no rebound.  Musculoskeletal:  Trace lower extremity edema  Neurological: He is alert and oriented to person, place, and time. No cranial nerve  deficit.  5 out of 5 strength, all 4 extremities  Skin: Skin is warm and dry.  Psychiatric: He has a normal mood and affect.  Nursing note and vitals reviewed.    ED Treatments / Results  DIAGNOSTIC STUDIES: Oxygen Saturation is 100% on RA, normal by my interpretation.   COORDINATION OF CARE: 3:02 AM-Discussed next steps with pt. Pt verbalized understanding and is agreeable with the plan.   Labs (all labs ordered are listed, but only abnormal results are displayed) Labs Reviewed  CBC WITH DIFFERENTIAL/PLATELET - Abnormal; Notable for the following:       Result Value   RBC 3.16 (*)    Hemoglobin 9.8 (*)    HCT 29.7 (*)    RDW 16.9 (*)    Lymphs Abs 0.6 (*)    All other components within normal limits  BASIC METABOLIC PANEL - Abnormal; Notable for the following:    Sodium 133 (*)    Chloride 93 (*)    Glucose, Bld 106 (*)    BUN 34 (*)    Creatinine, Ser 9.32 (*)    GFR calc non Af Amer 7 (*)    GFR calc Af Amer 8 (*)    All other components within normal limits    EKG  EKG Interpretation  Date/Time:  Wednesday September 27 2016 02:10:23 EDT Ventricular Rate:  114 PR Interval:    QRS Duration: 97 QT Interval:  333 QTC Calculation: 459 R Axis:   94 Text Interpretation:  Sinus tachycardia Biatrial enlargement Borderline right axis deviation Left ventricular hypertrophy No significant change since last tracing Confirmed by Thayer Jew [phone]) on 09/27/2016 2:13:05 AM       Radiology Dg Chest 2 View  Result Date: 09/27/2016 CLINICAL DATA:  Initial evaluation for acute shortness of breath, history of asthma. EXAM: CHEST  2 VIEW COMPARISON:  Prior radiograph from 08/19/2016. FINDINGS: Prominent cardiomegaly, similar to previous. Mediastinal silhouette within normal limits. Lungs normally inflated. Diffuse vascular congestion with interstitial prominence, most consistent  with pulmonary edema. No pleural effusion. No definite focal infiltrates, although these would be  somewhat difficult to exclude. No pneumothorax. No acute osseus abnormality. IMPRESSION: Cardiomegaly with moderate diffuse pulmonary interstitial edema. Electronically Signed   By: Jeannine Boga M.D.   On: 09/27/2016 02:56    Procedures Procedures (including critical care time)  CRITICAL CARE Performed by: Merryl Hacker   Total critical care time: 30 minutes  Critical care time was exclusive of separately billable procedures and treating other patients.  Critical care was necessary to treat or prevent imminent or life-threatening deterioration.  Critical care was time spent personally by me on the following activities: development of treatment plan with patient and/or surrogate as well as nursing, discussions with consultants, evaluation of patient's response to treatment, examination of patient, obtaining history from patient or surrogate, ordering and performing treatments and interventions, ordering and review of laboratory studies, ordering and review of radiographic studies, pulse oximetry and re-evaluation of patient's condition.   Medications Ordered in ED Medications  albuterol (PROVENTIL) (2.5 MG/3ML) 0.083% nebulizer solution 5 mg (5 mg Nebulization Given 09/27/16 0231)  cloNIDine (CATAPRES) tablet 0.2 mg (0.2 mg Oral Given 09/27/16 0349)  hydrALAZINE (APRESOLINE) injection 5 mg (5 mg Intravenous Given 09/27/16 0351)  magnesium sulfate IVPB 2 g 50 mL (0 g Intravenous Stopped 09/27/16 0430)  prochlorperazine (COMPAZINE) injection 10 mg (10 mg Intravenous Given 09/27/16 0351)  diphenhydrAMINE (BENADRYL) injection 25 mg (25 mg Intravenous Given 09/27/16 0351)     Initial Impression / Assessment and Plan / ED Course  I have reviewed the triage vital signs and the nursing notes.  Pertinent labs & imaging results that were available during my care of the patient were reviewed by me and considered in my medical decision making (see chart for details).     Patient presents  with shortness of breath. He is tachycardic and hypertensive. He has not taken his clonidine in one week. He does not appear overtly volume overloaded and reports full dialysis session yesterday with close to dry weight. Basic labwork including CBC and BMP obtained. EKG shows sinus tachycardia. Patient is without chest pain. No troponin was drawn. I have reviewed the patient's chart. He has been here several times with similar symptoms. He has had multiple evaluations. Chronically elevated d-dimer and multiple CT scans the last of which was in April that was negative for PE. Initially oxygen saturations mid 90s on oxygen; however, upon removal of oxygen, patient became more tachypnea And O2 sats dropped into the high 80s. Baseline anemia. Chest x-ray shows cardiomegaly with diffuse pulmonary interstitial edema. Patient was given hydralazine and clonidine for his blood pressure. Remains fairly hypertensive. Feel shortness of breath is likely related to some volume overload as well as hypertensive urgency. Will admit patient for further workup and management.  Discussed with the admitting hospitalist. Morning blood pressure medication order. Additionally, nitroglycerin ointment applied to attempt to offload lungs and decrease pressure.    Final Clinical Impressions(s) / ED Diagnoses   Final diagnoses:  Shortness of breath  Acute pulmonary edema ([place])  Hypertensive urgency    New Prescriptions New Prescriptions   No medications on file   I personally performed the services described in this documentation, which was scribed in my presence. The recorded information has been reviewed and is accurate.     Merryl Hacker, MD 09/27/16 [phone]

## 2016-09-27 NOTE — ED Notes (Signed)
Breakfast tray given. °

## 2016-09-27 NOTE — ED Triage Notes (Signed)
Pt arrived to Ed via EMS. C/o increasing SOB post dialysis treatment Tuesday morning. Use of homoe inhaler ineffective. C/o headache.

## 2016-09-27 NOTE — Consult Note (Signed)
[facility] Renal Consultation Note    Indication for Consultation:  Management of ESRD/hemodialysis, anemia, hypertension/volume, and secondary hyperparathyroidism. PCP:  HPI: Jim Wyatt is a 36 y.o. male with ESRD, HIV, Hx remote DVT, HTN, and Hx seizure disorder who is being admitted with HTN urgency and dyspnea/volume overload.  Mr. Mcwhirter usually dialyzes on TTS schedule at [facility]. He traveled to [place] last weekend. On Monday evening, he developed acute shortness of breath. Went to his usual HD on 7/31 with this complaint, his UF goal was increased with plan to lower his EDW down. Unfortunately, despite staying his entire treatment, he reports that he developed cramping and was unable to reach the new goal (only tolerated 2L UF). Last night, symptoms worsened which prompted ED evaluation. Denies chest or abdominal pain. + R sided headache which is recurrent issue for him and worsened with HD. Denies fever or chills. No N/V/D. He does endorse productive cough x 2 weeks, says sputum is "all the colors of the rainbow."  In the ED, found to be hypoxic and required nasal oxygen. BP was sky high, but came down significantly with meds. Labs showed high BNP and CXR + for pulmonary edema. WBC normal, Hgb 9.8, K/CO2 WNL.  Past Medical History:  Diagnosis Date  . Asthma   . CAP (community acquired pneumonia) 10/2013  . Cellulitis 11/2013   LLE  . DVT (deep venous thrombosis) ([place]) 10/2013   BLE  . ESRD on hemodialysis ([place]) 01/2015   AKI with nephrotic syndrome (16gm proteinuria at time of biopsy, from collapsing FSG). 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  Went on HD Dec 2016.   [name] Fever   . Headache   . HIV infection ([place]) dx'd 10/2013  . Hypertension   . Leg pain   . Multiple environmental allergies    "trees, dogs, cats"  . Multiple food allergies   . Seizures ([place])    last seizure per pt in  Jan  . Shingles < 2010   Past Surgical History:  Procedure Laterality Date  . AV FISTULA PLACEMENT Left 03/03/2015   Procedure: Creation of Left Arm RADIOCEPHALIC ARTERIOVENOUS FISTULA ;  Surgeon: Mal Misty, MD;  Location: [place];  Service: Vascular;  Laterality: Left;  . COLONOSCOPY N/A 03/18/2016   Procedure: COLONOSCOPY;  Surgeon: Manus Gunning, MD;  Location: [place];  Service: Gastroenterology;  Laterality: N/A;  . FEMUR FRACTURE SURGERY Right 09/2001  . FRACTURE SURGERY    . I&D EXTREMITY Right 12/18/2014   Procedure: IRRIGATION AND DEBRIDEMENT RIGHT LOWER EXTREMITY;  Surgeon: Ralene Ok, MD;  Location: [place];  Service: General;  Laterality: Right;  . INSERTION OF DIALYSIS CATHETER  2016  . LAPAROTOMY N/A 12/28/2015   Procedure: EXPLORATORY LAPAROTOMY BOWEL RESECTION ILEOCECECTOMY;  Surgeon: Clovis Riley, MD;  Location: [place];  Service: General;  Laterality: N/A;   Family History  Problem Relation Age of Onset  . Hypertension Mother   . Diabetes Maternal Grandmother   . Kidney disease Maternal Grandmother    Social History:  reports that he has never smoked. He has never used smokeless tobacco. He reports that he does not drink alcohol or use drugs.  ROS: As per HPI otherwise negative.  Physical Exam: Vitals:   09/27/16 0945 09/27/16 1000 09/27/16 1015 09/27/16 1030  BP: (!) 164/103 (!) 151/85 (!) 142/97 (!) 143/91  Pulse: (!) 111 (!) 114 (!) 107 (!) 108  Resp: 16 20 19 18   Temp:      TempSrc:      SpO2: 97% 99% 95% 96%  Weight:      Height:         General: Well developed, well nourished, in no acute distress. On nasal oxygen. Head: Normocephalic, atraumatic, sclera non-icteric, mucus membranes are moist. Neck: Supple without lymphadenopathy/masses.  Lungs: + bibasilar rales present, clear in upper lobes Heart: RRR; 2/6 systolic murmur Abdomen: Soft, non-tender, non-distended with normoactive bowel sounds.  Musculoskeletal:  Strength and  tone appear normal for age. Lower extremities: No LE edema or ischemic changes, no open wounds. Neuro: Alert and oriented X 3. Moves all extremities spontaneously. Psych:  Responds to questions appropriately with a normal affect. Dialysis Access: AVF + thrill  Allergies  Allergen Reactions  . Bactrim [Sulfamethoxazole-Trimethoprim] Other (See Comments)    Renal failure  . Onion Hives and Other (See Comments)    Per allergy test  . Procardia [Nifedipine] Hives, Shortness Of Breath and Itching  . Reglan [Metoclopramide] Other (See Comments)    Causes ticks/ jerks  . Vancomycin Other (See Comments)    Patient states vancomycin caused kidney injury  . Metoprolol Hives  . Amlodipine Palpitations and Other (See Comments)    Causes fatigue  . Augmentin [Amoxicillin-Pot Clavulanate] Hives   Prior to Admission medications   Medication Sig Start Date End Date Taking? Authorizing Provider  acetaminophen (TYLENOL) 500 MG tablet Take 1,000 mg by mouth every 6 (six) hours as needed (pain).   Yes [provider]  albuterol (PROVENTIL HFA;VENTOLIN HFA) 108 (90 Base) MCG/ACT inhaler Inhale 2 puffs into the lungs every 6 (six) hours as needed for wheezing or shortness of breath. 06/22/16  Yes Smiley Houseman, MD  cloNIDine (CATAPRES - DOSED IN MG/24 HR) 0.2 mg/24hr patch Place 1 patch (0.2 mg total) onto the skin once a week. Patient taking differently: Place 0.2 mg onto the skin every Sunday.  08/13/16  Yes Charlesetta Shanks, MD  dapsone 100 MG tablet Take 1 tablet (100 mg total) by mouth daily. 07/28/16  Yes Tommy Medal, Lavell Islam, MD  diphenhydrAMINE (BENADRYL) 25 mg capsule Take 1 capsule (25 mg total) by mouth every 6 (six) hours as needed. Patient taking differently: Take 50 mg by mouth daily.  08/03/16  Yes Kirichenko, Tatyana, PA-C  dolutegravir (TIVICAY) 50 MG tablet Take 1 tablet (50 mg total) by mouth daily. 06/02/16  Yes Tommy Medal, Lavell Islam, MD  emtricitabine-tenofovir AF (DESCOVY)  200-25 MG tablet Take 1 tablet by mouth daily. 06/02/16  Yes Tommy Medal, Lavell Islam, MD  famotidine (PEPCID) 20 MG tablet Take 1 tablet (20 mg total) by mouth 2 (two) times daily. Patient taking differently: Take 20 mg by mouth 2 (two) times daily as needed for heartburn (acid reflux).  08/03/16  Yes Kirichenko, Tatyana, PA-C  gabapentin (NEURONTIN) 100 MG capsule Take 1 capsule (100 mg total) by mouth 3 (three) times daily. Patient taking differently: Take 100 mg by mouth daily.  07/05/16  Yes [name], MD  hydrALAZINE (APRESOLINE) 100 MG tablet Take 100-200 mg by mouth See admin instructions. Take 1 tablet (100 mg) by mouth three times daily, take 2 tablets (200 mg) for SBP >190   Yes [provider]  hydrALAZINE (APRESOLINE) 50 MG tablet Take 2 tablets (100 mg total) by mouth 3 (three) times daily. 06/22/16  Yes Smiley Houseman, MD  hydrOXYzine (ATARAX/VISTARIL) 25 MG tablet Take 1 tablet (25 mg total) by  mouth 3 (three) times daily as needed for itching. Patient taking differently: Take 25 mg by mouth 2 (two) times daily.  07/05/16  Yes [name], MD  isosorbide mononitrate (IMDUR) 60 MG 24 hr tablet Take 1 tablet (60 mg total) by mouth daily. 08/23/16  Yes Valinda Party, DO  levETIRAcetam (KEPPRA) 250 MG tablet Take 1 tablet (250 mg) by mouth on Tuesday, Thursday, Saturday at 7pm Patient taking differently: Take 250 mg by mouth See admin instructions. Take one tablet (250 mg) by mouth every Tuesday, Thursday and Saturday at 7 pm 03/13/16  Yes Cameron Sprang, MD  levETIRAcetam (KEPPRA) 500 MG tablet Take 500 mg by mouth daily.   Yes [provider]  losartan (COZAAR) 100 MG tablet Take 100 mg by mouth daily.    Yes [provider]  topiramate (TOPAMAX) 50 MG tablet Take 1 tablet (50 mg total) by mouth 2 (two) times daily. 06/22/16  Yes Smiley Houseman, MD  triamcinolone ointment (KENALOG) 0.5 % Apply 1 application topically 2 (two) times  daily. Patient taking differently: Apply 1 application topically 2 (two) times daily as needed (rash/ itching).  07/19/16  Yes [name], MD  fluticasone (FLONASE) 50 MCG/ACT nasal spray Place 2 sprays into both nostrils daily. Patient not taking: Reported on 08/19/2016 08/02/16   Horton, Barbette Hair, MD  pantoprazole (PROTONIX) 40 MG tablet Take 1 tablet (40 mg total) by mouth daily. Patient not taking: Reported on 08/19/2016 06/22/16   Greenbrier Bing, DO  sodium chloride (OCEAN) 0.65 % SOLN nasal spray Place 1 spray into both nostrils as needed for congestion. Patient not taking: Reported on 08/19/2016 08/02/16   Horton, Barbette Hair, MD  sucralfate (CARAFATE) 1 GM/10ML suspension Take 10 mLs (1 g total) by mouth 4 (four) times daily -  with meals and at bedtime. Patient not taking: Reported on 08/14/2016 06/22/16   Mosby Bing, DO   Current Facility-Administered Medications  Medication Dose Route Frequency Provider Last Rate Last Dose  . acetaminophen (TYLENOL) tablet 650 mg  650 mg Oral Q6H PRN Norval Morton, MD       Or  . acetaminophen (TYLENOL) suppository 650 mg  650 mg Rectal Q6H PRN Norval Morton, MD      . Derrill Memo ON 10/01/2016] cloNIDine (CATAPRES - Dosed in mg/24 hr) patch 0.2 mg  0.2 mg Transdermal Q Sun Smith, Rondell A, MD      . diphenhydrAMINE (BENADRYL) capsule 25 mg  25 mg Oral Q6H PRN Fuller Plan A, MD      . dolutegravir (TIVICAY) tablet 50 mg  50 mg Oral Daily Smith, Rondell A, MD      . emtricitabine-tenofovir AF (DESCOVY) 200-25 MG per tablet 1 tablet  1 tablet Oral Daily Smith, Rondell A, MD      . famotidine (PEPCID) tablet 20 mg  20 mg Oral BID PRN Fuller Plan A, MD      . gabapentin (NEURONTIN) capsule 100 mg  100 mg Oral Daily Tamala Julian, Rondell A, MD   100 mg at 09/27/16 0940  . guaiFENesin (MUCINEX) 12 hr tablet 600 mg  600 mg Oral BID Fuller Plan A, MD   600 mg at 09/27/16 0940  . heparin injection 5,000 Units  5,000 Units Subcutaneous Q8H Smith,  Rondell A, MD      . hydrALAZINE (APRESOLINE) tablet 100 mg  100 mg Oral TID Tamala Julian, Rondell A, MD      . hydrALAZINE (APRESOLINE) tablet 100-200  mg  100-200 mg Oral See admin instructions Fuller Plan A, MD      . hydrOXYzine (ATARAX/VISTARIL) tablet 25 mg  25 mg Oral TID PRN Tamala Julian, Rondell A, MD      . ipratropium-albuterol (DUONEB) 0.5-2.5 (3) MG/3ML nebulizer solution 3 mL  3 mL Nebulization Q4H PRN Smith, Rondell A, MD      . isosorbide mononitrate (IMDUR) 24 hr tablet 60 mg  60 mg Oral Daily Horton, Barbette Hair, MD   60 mg at 09/27/16 0816  . labetalol (NORMODYNE,TRANDATE) injection 10 mg  10 mg Intravenous Once Waldron Session, MD      . Derrill Memo ON 09/28/2016] levETIRAcetam (KEPPRA) tablet 250 mg  250 mg Oral Q T,Th,Sat-1800 Smith, Rondell A, MD      . levETIRAcetam (KEPPRA) tablet 500 mg  500 mg Oral Daily Tamala Julian, Rondell A, MD   500 mg at 09/27/16 0941  . losartan (COZAAR) tablet 100 mg  100 mg Oral Daily Horton, Barbette Hair, MD   100 mg at 09/27/16 0724  . ondansetron (ZOFRAN) tablet 4 mg  4 mg Oral Q6H PRN Fuller Plan A, MD       Or  . ondansetron (ZOFRAN) injection 4 mg  4 mg Intravenous Q6H PRN Smith, Rondell A, MD      . topiramate (TOPAMAX) tablet 50 mg  50 mg Oral BID Fuller Plan A, MD   50 mg at 09/27/16 0940  . triamcinolone ointment (KENALOG) 0.5 % 1 application  1 application Topical BID PRN Norval Morton, MD       Current Outpatient Prescriptions  Medication Sig Dispense Refill  . acetaminophen (TYLENOL) 500 MG tablet Take 1,000 mg by mouth every 6 (six) hours as needed (pain).    [name] albuterol (PROVENTIL HFA;VENTOLIN HFA) 108 (90 Base) MCG/ACT inhaler Inhale 2 puffs into the lungs every 6 (six) hours as needed for wheezing or shortness of breath. 1 Inhaler 0  . cloNIDine (CATAPRES - DOSED IN MG/24 HR) 0.2 mg/24hr patch Place 1 patch (0.2 mg total) onto the skin once a week. (Patient taking differently: Place 0.2 mg onto the skin every Sunday. ) 4 patch 12  . dapsone 100 MG  tablet Take 1 tablet (100 mg total) by mouth daily. 30 tablet 11  . diphenhydrAMINE (BENADRYL) 25 mg capsule Take 1 capsule (25 mg total) by mouth every 6 (six) hours as needed. (Patient taking differently: Take 50 mg by mouth daily. ) 30 capsule 0  . dolutegravir (TIVICAY) 50 MG tablet Take 1 tablet (50 mg total) by mouth daily. 30 tablet 6  . emtricitabine-tenofovir AF (DESCOVY) 200-25 MG tablet Take 1 tablet by mouth daily. 30 tablet 6  . famotidine (PEPCID) 20 MG tablet Take 1 tablet (20 mg total) by mouth 2 (two) times daily. (Patient taking differently: Take 20 mg by mouth 2 (two) times daily as needed for heartburn (acid reflux). ) 30 tablet 0  . gabapentin (NEURONTIN) 100 MG capsule Take 1 capsule (100 mg total) by mouth 3 (three) times daily. (Patient taking differently: Take 100 mg by mouth daily. ) 90 capsule 3  . hydrALAZINE (APRESOLINE) 100 MG tablet Take 100-200 mg by mouth See admin instructions. Take 1 tablet (100 mg) by mouth three times daily, take 2 tablets (200 mg) for SBP >190    . hydrALAZINE (APRESOLINE) 50 MG tablet Take 2 tablets (100 mg total) by mouth 3 (three) times daily. 180 tablet 0  . hydrOXYzine (ATARAX/VISTARIL) 25 MG tablet Take 1 tablet (25  mg total) by mouth 3 (three) times daily as needed for itching. (Patient taking differently: Take 25 mg by mouth 2 (two) times daily. ) 90 tablet 3  . isosorbide mononitrate (IMDUR) 60 MG 24 hr tablet Take 1 tablet (60 mg total) by mouth daily. 30 tablet 0  . levETIRAcetam (KEPPRA) 250 MG tablet Take 1 tablet (250 mg) by mouth on Tuesday, Thursday, Saturday at 7pm (Patient taking differently: Take 250 mg by mouth See admin instructions. Take one tablet (250 mg) by mouth every Tuesday, Thursday and Saturday at 7 pm) 60 tablet 11  . levETIRAcetam (KEPPRA) 500 MG tablet Take 500 mg by mouth daily.    [name] losartan (COZAAR) 100 MG tablet Take 100 mg by mouth daily.     [name] topiramate (TOPAMAX) 50 MG tablet Take 1 tablet (50 mg total) by  mouth 2 (two) times daily. 60 tablet 0  . triamcinolone ointment (KENALOG) 0.5 % Apply 1 application topically 2 (two) times daily. (Patient taking differently: Apply 1 application topically 2 (two) times daily as needed (rash/ itching). ) 30 g 2  . fluticasone (FLONASE) 50 MCG/ACT nasal spray Place 2 sprays into both nostrils daily. (Patient not taking: Reported on 08/19/2016) 16 g 0  . pantoprazole (PROTONIX) 40 MG tablet Take 1 tablet (40 mg total) by mouth daily. (Patient not taking: Reported on 08/19/2016) 30 tablet 0  . sodium chloride (OCEAN) 0.65 % SOLN nasal spray Place 1 spray into both nostrils as needed for congestion. (Patient not taking: Reported on 08/19/2016) 480 mL 0  . sucralfate (CARAFATE) 1 GM/10ML suspension Take 10 mLs (1 g total) by mouth 4 (four) times daily -  with meals and at bedtime. (Patient not taking: Reported on 08/14/2016) 420 mL 0   Labs: Basic Metabolic Panel:  Recent Labs Lab 09/27/16 0227  NA 133*  K 4.4  CL 93*  CO2 28  GLUCOSE 106*  BUN 34*  CREATININE 9.32*  CALCIUM 9.7   CBC:  Recent Labs Lab 09/27/16 0227  WBC 5.8  NEUTROABS 4.5  HGB 9.8*  HCT 29.7*  MCV 94.0  PLT 173   Studies/Results: Dg Chest 2 View  Result Date: 09/27/2016 CLINICAL DATA:  Initial evaluation for acute shortness of breath, history of asthma. EXAM: CHEST  2 VIEW COMPARISON:  Prior radiograph from 08/19/2016. FINDINGS: Prominent cardiomegaly, similar to previous. Mediastinal silhouette within normal limits. Lungs normally inflated. Diffuse vascular congestion with interstitial prominence, most consistent with pulmonary edema. No pleural effusion. No definite focal infiltrates, although these would be somewhat difficult to exclude. No pneumothorax. No acute osseus abnormality. IMPRESSION: Cardiomegaly with moderate diffuse pulmonary interstitial edema. Electronically Signed   By: Jeannine Boga M.D.   On: 09/27/2016 02:56    Dialysis Orders:  TTS at [facility] 3:45hr, BFR 450, DFR 800, EDW 68.5kg, 2K/2Ca bath, UF profile 4, AVF, No heparin - Mircera 235mcg IV q 2 weeks (last given 7/19)  Assessment/Plan: 1.  Dyspnea/volume overload: CXR with pulm edema, needs further UF with dialysis. Will plan to dialyze today for volume. 2.  ESRD:  Usually MWF schedule, for extra HD today (as above). 4L UF goal, will try to get EDW closer to 65-66kg. 3.  Hypertension/volume: BP high with fluid overload, should improve with HD. 4.  Anemia: Hgb 9.8, due for ESA dosing today. Will give Aranesp 148mcg. 5.  Metabolic bone disease: Ca ok, phos pending here, but historically high (often refusing binders). 6.  HIV/AIDS: Last CD4 140, VL 25. Continue  ART meds. 7. Chronic headaches: He says these are exacerbated by HD, wants to know how we can treat them. Will need to follow BP/symptoms on HD today to look for triggers. CT head 07/2016 showed chronic sinusitis only.  Veneta Penton, PA-C 09/27/2016, 10:56 AM  [facility] Pager: [phone]

## 2016-09-27 NOTE — ED Notes (Signed)
Getting restless Dr.Horton aware coming to see

## 2016-09-28 DIAGNOSIS — Z992 Dependence on renal dialysis: Secondary | ICD-10-CM

## 2016-09-28 DIAGNOSIS — E8770 Fluid overload, unspecified: Secondary | ICD-10-CM

## 2016-09-28 DIAGNOSIS — B20 Human immunodeficiency virus [HIV] disease: Secondary | ICD-10-CM

## 2016-09-28 DIAGNOSIS — N186 End stage renal disease: Secondary | ICD-10-CM

## 2016-09-28 DIAGNOSIS — R51 Headache: Secondary | ICD-10-CM

## 2016-09-28 DIAGNOSIS — Z86718 Personal history of other venous thrombosis and embolism: Secondary | ICD-10-CM

## 2016-09-28 LAB — BASIC METABOLIC PANEL
Anion gap: 10 (ref 5–15)
BUN: 23 mg/dL — ABNORMAL HIGH (ref 6–20)
CO2: 28 mmol/L (ref 22–32)
Calcium: 8.9 mg/dL (ref 8.9–10.3)
Chloride: 93 mmol/L — ABNORMAL LOW (ref 101–111)
Creatinine, Ser: 6.67 mg/dL — ABNORMAL HIGH (ref 0.61–1.24)
GFR calc Af Amer: 11 mL/min — ABNORMAL LOW (ref >60)
GFR calc non Af Amer: 10 mL/min — ABNORMAL LOW (ref >60)
Glucose, Bld: 116 mg/dL — ABNORMAL HIGH (ref 65–99)
Potassium: 3.9 mmol/L (ref 3.5–5.1)
Sodium: 131 mmol/L — ABNORMAL LOW (ref 135–145)

## 2016-09-28 LAB — CBC
HCT: 30.9 % — ABNORMAL LOW (ref 39.0–52.0)
Hemoglobin: 9.8 g/dL — ABNORMAL LOW (ref 13.0–17.0)
MCH: 30.3 pg (ref 26.0–34.0)
MCHC: 31.7 g/dL (ref 30.0–36.0)
MCV: 95.7 fL (ref 78.0–100.0)
Platelets: 161 10*3/uL (ref 150–400)
RBC: 3.23 MIL/uL — ABNORMAL LOW (ref 4.22–5.81)
RDW: 16.6 % — ABNORMAL HIGH (ref 11.5–15.5)
WBC: 4.8 10*3/uL (ref 4.0–10.5)

## 2016-09-28 MED ORDER — SODIUM CHLORIDE 0.9 % IV SOLN: 100.0000 mL | INTRAVENOUS | Status: DC | PRN

## 2016-09-28 MED ORDER — PENTAFLUOROPROP-TETRAFLUOROETH EX AERO: 1.0000 "application " | INHALATION_SPRAY | CUTANEOUS | Status: DC | PRN

## 2016-09-28 MED ORDER — CLONIDINE HCL 0.3 MG/24HR TD PTWK
0.3000 mg | MEDICATED_PATCH | TRANSDERMAL | Status: DC
  Filled 2016-09-28: qty 1

## 2016-09-28 MED ORDER — LIDOCAINE-PRILOCAINE 2.5-2.5 % EX CREA
1.0000 "application " | TOPICAL_CREAM | CUTANEOUS | Status: DC | PRN
  Filled 2016-09-28: qty 5

## 2016-09-28 MED ORDER — CLONIDINE HCL 0.3 MG/24HR TD PTWK: 0.3000 mg | MEDICATED_PATCH | TRANSDERMAL | 12 refills | Status: DC

## 2016-09-28 MED ORDER — LIDOCAINE HCL (PF) 1 % IJ SOLN: 5.0000 mL | INTRAMUSCULAR | Status: DC | PRN

## 2016-09-28 MED ORDER — DARBEPOETIN ALFA 100 MCG/0.5ML IJ SOSY
PREFILLED_SYRINGE | INTRAMUSCULAR | Status: AC
  Filled 2016-09-28: qty 0.5

## 2016-09-28 NOTE — Plan of Care (Signed)
Problem: Education: Goal: Knowledge of [facility] Education information/materials will improve Outcome: Progressing Discussed with patient about plan of care and talking to his mother about his admission with permission from him with teach back displayed.

## 2016-09-28 NOTE — Progress Notes (Signed)
Patient being discharged home. Patient given discharge instructions. Patient educated on changes to medication. Patient verbalized understanding.

## 2016-09-28 NOTE — Discharge Summary (Signed)
Physician Discharge Summary  Jim Wyatt YNW:[id number] DOB: 03-29-1980 DOA: 09/27/2016  PCP: [name], MD  Admit date: 09/27/2016 Discharge date: 09/28/2016  Admitted From: (Home) Disposition:  (Home)  Recommendations for Outpatient Follow-up:  1. Follow up with PCP in 1-2 weeks 2. FOLLOW UP WITH YOUR HD CLINIC .  Home Health: (/NO) Equipment/Devices:NONE    Discharge Condition:(Stable)  CODE STATUS: (FULL) Diet recommendation: Heart Healthy   Brief/Interim Summary:  36 y.o. male with medical history significant of  ESRD on HD(T/T/Sat), HIV, uncontrolled HTN, DVT in 2015 not anticoagulation, seizure disorder,HIV and asthma; who presents with complaints of shortness of breath , was found to be in volume overload required HD urgently back to back 2 days in row with subsequent improvement in his SOB . Patient was noted to have elevated blood pressures with this volume overloaded status that has improved partially with dialysis, I increased his clonidine patch dose to 0.3 , and continue his Cozaar and hydralazine , patient has complaints issues and needs to have close follow-up appointment with his nephrology clinic.  Discharge Diagnoses:  Principal Problem:   Fluid overload Active Problems:   Human immunodeficiency virus (HIV) disease (HCC)   History of DVT (deep vein thrombosis)   Headache   Seizure disorder (HCC)   ESRD on hemodialysis (HCC)  Fluid overload: in the setting of ESRD .  Poorly controlled HTN : compliance issues . Increase the dose of clonidine, continue losartan,isosorbide mononitrate, and hydralazine  Anemia of chronic kidney disease: Hemoglobin 9.8 on admission which appears near patient's baseline  Seizure disorder - Continue Keppra   HIV: Patient's last CD4 count last was done 140 on 09/04/16. - Continue HARRT  .   Discharge Instructions  Discharge Instructions    Diet - low sodium heart healthy    Complete by:  As directed     Increase activity slowly    Complete by:  As directed      Allergies as of 09/28/2016      Reactions   Bactrim [sulfamethoxazole-trimethoprim] Other (See Comments)   Renal failure   Onion Hives, Other (See Comments)   Per allergy test   Procardia [nifedipine] Hives, Shortness Of Breath, Itching   Reglan [metoclopramide] Other (See Comments)   Causes ticks/ jerks   Vancomycin Other (See Comments)   Patient states vancomycin caused kidney injury   Metoprolol Hives   Amlodipine Palpitations, Other (See Comments)   Causes fatigue   Augmentin [amoxicillin-pot Clavulanate] Hives      Medication List    STOP taking these medications   cloNIDine 0.2 mg/24hr patch Commonly known as:  CATAPRES - Dosed in mg/24 hr Replaced by:  cloNIDine 0.3 mg/24hr patch   diphenhydrAMINE 25 mg capsule Commonly known as:  BENADRYL     TAKE these medications   acetaminophen 500 MG tablet Commonly known as:  TYLENOL Take 1,000 mg by mouth every 6 (six) hours as needed (pain).   albuterol 108 (90 Base) MCG/ACT inhaler Commonly known as:  PROVENTIL HFA;VENTOLIN HFA Inhale 2 puffs into the lungs every 6 (six) hours as needed for wheezing or shortness of breath.   cloNIDine 0.3 mg/24hr patch Commonly known as:  CATAPRES - Dosed in mg/24 hr Place 1 patch (0.3 mg total) onto the skin every Sunday. Replaces:  cloNIDine 0.2 mg/24hr patch   dapsone 100 MG tablet Take 1 tablet (100 mg total) by mouth daily.   dolutegravir 50 MG tablet Commonly known as:  TIVICAY Take 1 tablet (50 mg  total) by mouth daily.   emtricitabine-tenofovir AF 200-25 MG tablet Commonly known as:  DESCOVY Take 1 tablet by mouth daily.   famotidine 20 MG tablet Commonly known as:  PEPCID Take 1 tablet (20 mg total) by mouth 2 (two) times daily. What changed:  when to take this  reasons to take this   fluticasone 50 MCG/ACT nasal spray Commonly known as:  FLONASE Place 2 sprays into both nostrils daily.   gabapentin  100 MG capsule Commonly known as:  NEURONTIN Take 1 capsule (100 mg total) by mouth 3 (three) times daily. What changed:  when to take this   hydrALAZINE 100 MG tablet Commonly known as:  APRESOLINE Take 100-200 mg by mouth See admin instructions. Take 1 tablet (100 mg) by mouth three times daily, take 2 tablets (200 mg) for SBP >190   hydrALAZINE 50 MG tablet Commonly known as:  APRESOLINE Take 2 tablets (100 mg total) by mouth 3 (three) times daily.   hydrOXYzine 25 MG tablet Commonly known as:  ATARAX/VISTARIL Take 1 tablet (25 mg total) by mouth 3 (three) times daily as needed for itching. What changed:  when to take this   isosorbide mononitrate 60 MG 24 hr tablet Commonly known as:  IMDUR Take 1 tablet (60 mg total) by mouth daily.   levETIRAcetam 500 MG tablet Commonly known as:  KEPPRA Take 500 mg by mouth daily. What changed:  Another medication with the same name was changed. Make sure you understand how and when to take each.   levETIRAcetam 250 MG tablet Commonly known as:  KEPPRA Take 1 tablet (250 mg) by mouth on Tuesday, Thursday, Saturday at 7pm What changed:  how much to take  how to take this  when to take this  additional instructions   losartan 100 MG tablet Commonly known as:  COZAAR Take 100 mg by mouth daily.   pantoprazole 40 MG tablet Commonly known as:  PROTONIX Take 1 tablet (40 mg total) by mouth daily.   sodium chloride 0.65 % Soln nasal spray Commonly known as:  OCEAN Place 1 spray into both nostrils as needed for congestion.   sucralfate 1 GM/10ML suspension Commonly known as:  CARAFATE Take 10 mLs (1 g total) by mouth 4 (four) times daily -  with meals and at bedtime.   topiramate 50 MG tablet Commonly known as:  TOPAMAX Take 1 tablet (50 mg total) by mouth 2 (two) times daily.   triamcinolone ointment 0.5 % Commonly known as:  KENALOG Apply 1 application topically 2 (two) times daily. What changed:  when to take  this  reasons to take this       Allergies  Allergen Reactions  . Bactrim [Sulfamethoxazole-Trimethoprim] Other (See Comments)    Renal failure  . Onion Hives and Other (See Comments)    Per allergy test  . Procardia [Nifedipine] Hives, Shortness Of Breath and Itching  . Reglan [Metoclopramide] Other (See Comments)    Causes ticks/ jerks  . Vancomycin Other (See Comments)    Patient states vancomycin caused kidney injury  . Metoprolol Hives  . Amlodipine Palpitations and Other (See Comments)    Causes fatigue  . Augmentin [Amoxicillin-Pot Clavulanate] Hives    Consultations:  Nephrology .   Procedures/Studies: Dg Chest 2 View  Result Date: 09/27/2016 CLINICAL DATA:  Initial evaluation for acute shortness of breath, history of asthma. EXAM: CHEST  2 VIEW COMPARISON:  Prior radiograph from 08/19/2016. FINDINGS: Prominent cardiomegaly, similar to previous. Mediastinal silhouette within  normal limits. Lungs normally inflated. Diffuse vascular congestion with interstitial prominence, most consistent with pulmonary edema. No pleural effusion. No definite focal infiltrates, although these would be somewhat difficult to exclude. No pneumothorax. No acute osseus abnormality. IMPRESSION: Cardiomegaly with moderate diffuse pulmonary interstitial edema. Electronically Signed   By: Jeannine Boga M.D.   On: 09/27/2016 02:56    (Echo, Carotid, EGD, Colonoscopy, ERCP)     Subjective:   Discharge Exam: Vitals:   09/28/16 1330 09/28/16 1400  BP: (!) 155/89 (!) 156/100  Pulse: (!) 112 (!) 108  Resp: (!) 23 19  Temp:     Vitals:   09/28/16 1230 09/28/16 1300 09/28/16 1330 09/28/16 1400  BP: (!) 155/94 (!) 160/98 (!) 155/89 (!) 156/100  Pulse: (!) 108 (!) 108 (!) 112 (!) 108  Resp: (!) 22 (!) 21 (!) 23 19  Temp:      TempSrc:      SpO2: 100% 96% 100% 99%  Weight:      Height:        General: Pt is alert, awake, not in acute distress Cardiovascular: RRR, S1S2, No [place]  . Respiratory: CTA bilaterally, no wheezing, no rhonchi Abdominal: Soft, NT, ND, bowel sounds + Extremities: no edema, no cyanosis    The results of significant diagnostics from this hospitalization (including imaging, microbiology, ancillary and laboratory) are listed below for reference.     Microbiology: No results found for this or any previous visit (from the past 240 hour(s)).   Labs: BNP (last 3 results)  Recent Labs  08/19/16 1727 09/27/16 0227  BNP 2,378.2* 4,259.5*   Basic Metabolic Panel:  Recent Labs Lab 09/27/16 0227 09/28/16 0254  NA 133* 131*  K 4.4 3.9  CL 93* 93*  CO2 28 28  GLUCOSE 106* 116*  BUN 34* 23*  CREATININE 9.32* 6.67*  CALCIUM 9.7 8.9   Liver Function Tests: No results for input(s): AST, ALT, ALKPHOS, BILITOT, PROT, ALBUMIN in the last 168 hours. No results for input(s): LIPASE, AMYLASE in the last 168 hours. No results for input(s): AMMONIA in the last 168 hours. CBC:  Recent Labs Lab 09/27/16 0227 09/28/16 0254  WBC 5.8 4.8  NEUTROABS 4.5  --   HGB 9.8* 9.8*  HCT 29.7* 30.9*  MCV 94.0 95.7  PLT 173 161   Cardiac Enzymes: No results for input(s): CKTOTAL, CKMB, CKMBINDEX, TROPONINI in the last 168 hours. BNP: Invalid input(s): POCBNP CBG: No results for input(s): GLUCAP in the last 168 hours. D-Dimer No results for input(s): DDIMER in the last 72 hours. Hgb A1c No results for input(s): HGBA1C in the last 72 hours. Lipid Profile No results for input(s): CHOL, HDL, LDLCALC, TRIG, CHOLHDL, LDLDIRECT in the last 72 hours. Thyroid function studies No results for input(s): TSH, T4TOTAL, T3FREE, THYROIDAB in the last 72 hours.  Invalid input(s): FREET3 Anemia work up No results for input(s): VITAMINB12, FOLATE, FERRITIN, TIBC, IRON, RETICCTPCT in the last 72 hours. Urinalysis    Component Value Date/Time   COLORURINE YELLOW 06/02/2016 0753   APPEARANCEUR HAZY (A) 06/02/2016 0753   LABSPEC 1.014 06/02/2016 0753    PHURINE 9.0 (H) 06/02/2016 0753   GLUCOSEU >=500 (A) 06/02/2016 0753   HGBUR NEGATIVE 06/02/2016 0753   BILIRUBINUR NEGATIVE 06/02/2016 0753   BILIRUBINUR Negative 01/19/2016 0934   KETONESUR NEGATIVE 06/02/2016 0753   PROTEINUR >=300 (A) 06/02/2016 0753   UROBILINOGEN 0.2 01/19/2016 0934   UROBILINOGEN 0.2 12/18/2014 1925   NITRITE NEGATIVE 06/02/2016 0753   LEUKOCYTESUR NEGATIVE 06/02/2016  7011   Sepsis Labs Invalid input(s): PROCALCITONIN,  WBC,  LACTICIDVEN Microbiology No results found for this or any previous visit (from the past 240 hour(s)).   Time coordinating discharge: Over 30 minutes  SIGNED:   Waldron Session, MD  [facility] 09/28/2016, 2:41 PM Pager   If 7PM-7AM, please contact night-coverage [email or web address] Password [email or web address]

## 2016-09-28 NOTE — Progress Notes (Addendum)
Given report to dialysis nurse at [phone] Amber.

## 2016-09-28 NOTE — Procedures (Signed)
Patient seen on Hemodialysis. QB 400, UF goal 3L Treatment adjusted as needed.  Elmarie Shiley MD [facility]. Office # [phone] Pager # [phone] 11:27 AM

## 2016-09-28 NOTE — Care Management Note (Addendum)
Case Management Note  Patient Details  Name: Jim Wyatt MRN: [id number] Date of Birth: 01-14-1981  Subjective/Objective:    From home, has hx of ESRD 042,  presents with sob, htn urgency, pul edema, requiring urgent HD .                Action/Plan: NCM will follow for dc needs.  Expected Discharge Date:  09/30/16               Expected Discharge Plan:  Home/Self Care  In-House Referral:     Discharge planning Services  CM Consult  Post Acute Care Choice:    Choice offered to:     DME Arranged:    DME Agency:     HH Arranged:    HH Agency:     Status of Service:  In process, will continue to follow  If discussed at Long Length of Stay Meetings, dates discussed:    Additional Comments:  Zenon Mayo, RN 09/28/2016, 2:21 PM

## 2016-09-28 NOTE — Progress Notes (Signed)
Patient ID: Jim Wyatt, male   DOB: 1980-07-09, 36 y.o.   MRN: [id number]  [facility] Progress Note   Assessment/ Plan:   1.  Dyspnea/volume overload: Consistent with missed dialysis/increased intradialytic weight gain-plan for hemodialysis again today per his usual outpatient schedule with efforts at ultrafiltration. 2.  ESRD:  Usually on a Tuesday/Thursday/Saturday dialysis schedule -underwent an additional treatment of hemodialysis yesterday for volume unloading and plan for dialysis again today prior to discharge. 3.  Hypertension/volume: Low pressure remains elevated-monitor with ultrafiltration and hemodialysis for improvement 4.  Anemia: Hgb 9.8, due for ESA dosing today. Will give Aranesp 139mcg. 5.  Metabolic bone disease: Ca ok, phos pending here, but historically high (often refusing binders). 6.  HIV/AIDS: Last CD4 140, VL 25. Continue ART meds. 7. Chronic headaches:  suspect likely related to blood pressure-monitor with hemodialysis  Subjective:   Reports to be feeling much better after dialysis-concerned about cramping at dialysis    Objective:   BP (!) 159/112   Pulse (!) 103   Temp 98.7 F (37.1 C) (Oral)   Resp 17   Ht 5\' 7"  (1.702 m)   Wt 66.6 kg (146 lb 12.8 oz)   SpO2 100%   BMI 22.99 kg/m   Physical Exam: DJT:[id number] resting in bed CVS: Pulse regular tachycardia, S1 and S2 normal without any murmurs Resp: Fine rales right base otherwise clear to auscultation, no rhonchi Abd: Soft, flat, nontender Ext: Trace lower extremity edema  Labs: BMET  Recent Labs Lab 09/27/16 0227 09/28/16 0254  NA 133* 131*  K 4.4 3.9  CL 93* 93*  CO2 28 28  GLUCOSE 106* 116*  BUN 34* 23*  CREATININE 9.32* 6.67*  CALCIUM 9.7 8.9   CBC  Recent Labs Lab 09/27/16 0227 09/28/16 0254  WBC 5.8 4.8  NEUTROABS 4.5  --   HGB 9.8* 9.8*  HCT 29.7* 30.9*  MCV 94.0 95.7  PLT 173 161   Medications:    . [START ON 10/01/2016] cloNIDine  0.2 mg  Transdermal Q Sun  . darbepoetin (ARANESP) injection - DIALYSIS  100 mcg Intravenous Q Thu-HD  . dolutegravir  50 mg Oral Daily  . emtricitabine-tenofovir AF  1 tablet Oral Daily  . gabapentin  100 mg Oral Daily  . guaiFENesin  600 mg Oral BID  . heparin  5,000 Units Subcutaneous Q8H  . hydrALAZINE  100 mg Oral TID  . isosorbide mononitrate  60 mg Oral Daily  . labetalol  10 mg Intravenous Once  . levETIRAcetam  250 mg Oral Q T,Th,Sat-1800  . levETIRAcetam  500 mg Oral Daily  . losartan  100 mg Oral Daily  . topiramate  50 mg Oral BID   Elmarie Shiley, MD 09/28/2016, 7:49 AM

## 2016-10-06 ENCOUNTER — Inpatient Hospital Stay (HOSPITAL_COMMUNITY)
Admission: EM | Admit: 2016-10-06 | Discharge: 2016-10-08 | DRG: 291 | Disposition: A | Discharge status: Home / Self Care | Payer: Medicare Other | Attending: Internal Medicine | Admitting: Internal Medicine

## 2016-10-06 ENCOUNTER — Encounter (HOSPITAL_COMMUNITY): Payer: Self-pay | Admitting: *Deleted

## 2016-10-06 ENCOUNTER — Emergency Department (HOSPITAL_COMMUNITY): Payer: Medicare Other

## 2016-10-06 DIAGNOSIS — N031 Chronic nephritic syndrome with focal and segmental glomerular lesions: Secondary | ICD-10-CM | POA: Diagnosis present

## 2016-10-06 DIAGNOSIS — N186 End stage renal disease: Secondary | ICD-10-CM | POA: Diagnosis not present

## 2016-10-06 DIAGNOSIS — M792 Neuralgia and neuritis, unspecified: Secondary | ICD-10-CM

## 2016-10-06 DIAGNOSIS — B20 Human immunodeficiency virus [HIV] disease: Secondary | ICD-10-CM | POA: Diagnosis not present

## 2016-10-06 DIAGNOSIS — I160 Hypertensive urgency: Secondary | ICD-10-CM | POA: Diagnosis not present

## 2016-10-06 DIAGNOSIS — N2581 Secondary hyperparathyroidism of renal origin: Secondary | ICD-10-CM | POA: Diagnosis present

## 2016-10-06 DIAGNOSIS — D638 Anemia in other chronic diseases classified elsewhere: Secondary | ICD-10-CM | POA: Diagnosis present

## 2016-10-06 DIAGNOSIS — I5033 Acute on chronic diastolic (congestive) heart failure: Secondary | ICD-10-CM | POA: Diagnosis present

## 2016-10-06 DIAGNOSIS — D631 Anemia in chronic kidney disease: Secondary | ICD-10-CM | POA: Diagnosis present

## 2016-10-06 DIAGNOSIS — Z992 Dependence on renal dialysis: Secondary | ICD-10-CM | POA: Diagnosis not present

## 2016-10-06 DIAGNOSIS — R0602 Shortness of breath: Secondary | ICD-10-CM | POA: Diagnosis present

## 2016-10-06 DIAGNOSIS — M542 Cervicalgia: Secondary | ICD-10-CM | POA: Diagnosis present

## 2016-10-06 DIAGNOSIS — Z86718 Personal history of other venous thrombosis and embolism: Secondary | ICD-10-CM | POA: Diagnosis not present

## 2016-10-06 DIAGNOSIS — J45909 Unspecified asthma, uncomplicated: Secondary | ICD-10-CM | POA: Diagnosis present

## 2016-10-06 DIAGNOSIS — I132 Hypertensive heart and chronic kidney disease with heart failure and with stage 5 chronic kidney disease, or end stage renal disease: Principal | ICD-10-CM | POA: Diagnosis present

## 2016-10-06 DIAGNOSIS — E8770 Fluid overload, unspecified: Secondary | ICD-10-CM | POA: Diagnosis present

## 2016-10-06 DIAGNOSIS — I82409 Acute embolism and thrombosis of unspecified deep veins of unspecified lower extremity: Secondary | ICD-10-CM | POA: Diagnosis present

## 2016-10-06 DIAGNOSIS — N032 Chronic nephritic syndrome with diffuse membranous glomerulonephritis: Secondary | ICD-10-CM | POA: Diagnosis present

## 2016-10-06 DIAGNOSIS — K529 Noninfective gastroenteritis and colitis, unspecified: Secondary | ICD-10-CM | POA: Diagnosis present

## 2016-10-06 DIAGNOSIS — L708 Other acne: Secondary | ICD-10-CM

## 2016-10-06 DIAGNOSIS — J810 Acute pulmonary edema: Secondary | ICD-10-CM | POA: Diagnosis not present

## 2016-10-06 DIAGNOSIS — R0902 Hypoxemia: Secondary | ICD-10-CM

## 2016-10-06 DIAGNOSIS — Z79899 Other long term (current) drug therapy: Secondary | ICD-10-CM

## 2016-10-06 DIAGNOSIS — B259 Cytomegaloviral disease, unspecified: Secondary | ICD-10-CM | POA: Diagnosis present

## 2016-10-06 DIAGNOSIS — G40909 Epilepsy, unspecified, not intractable, without status epilepticus: Secondary | ICD-10-CM | POA: Diagnosis not present

## 2016-10-06 DIAGNOSIS — E8779 Other fluid overload: Secondary | ICD-10-CM

## 2016-10-06 DIAGNOSIS — G4733 Obstructive sleep apnea (adult) (pediatric): Secondary | ICD-10-CM | POA: Diagnosis present

## 2016-10-06 DIAGNOSIS — I10 Essential (primary) hypertension: Secondary | ICD-10-CM | POA: Diagnosis present

## 2016-10-06 DIAGNOSIS — N08 Glomerular disorders in diseases classified elsewhere: Secondary | ICD-10-CM | POA: Diagnosis present

## 2016-10-06 LAB — CBC
HCT: 32.1 % — ABNORMAL LOW (ref 39.0–52.0)
Hemoglobin: 10.2 g/dL — ABNORMAL LOW (ref 13.0–17.0)
MCH: 30.1 pg (ref 26.0–34.0)
MCHC: 31.8 g/dL (ref 30.0–36.0)
MCV: 94.7 fL (ref 78.0–100.0)
Platelets: 178 10*3/uL (ref 150–400)
RBC: 3.39 MIL/uL — ABNORMAL LOW (ref 4.22–5.81)
RDW: 16.9 % — ABNORMAL HIGH (ref 11.5–15.5)
WBC: 7.1 10*3/uL (ref 4.0–10.5)

## 2016-10-06 LAB — BASIC METABOLIC PANEL
Anion gap: 13 (ref 5–15)
BUN: 46 mg/dL — ABNORMAL HIGH (ref 6–20)
CO2: 29 mmol/L (ref 22–32)
Calcium: 9.9 mg/dL (ref 8.9–10.3)
Chloride: 93 mmol/L — ABNORMAL LOW (ref 101–111)
Creatinine, Ser: 9.89 mg/dL — ABNORMAL HIGH (ref 0.61–1.24)
GFR calc Af Amer: 7 mL/min — ABNORMAL LOW (ref >60)
GFR calc non Af Amer: 6 mL/min — ABNORMAL LOW (ref >60)
Glucose, Bld: 96 mg/dL (ref 65–99)
Potassium: 3.7 mmol/L (ref 3.5–5.1)
Sodium: 135 mmol/L (ref 135–145)

## 2016-10-06 LAB — I-STAT TROPONIN, ED: Troponin i, poc: 0.15 ng/mL (ref 0.00–0.08)

## 2016-10-06 LAB — TROPONIN I
Troponin I: 0.16 ng/mL (ref <0.03)
Troponin I: 0.14 ng/mL (ref <0.03)

## 2016-10-06 LAB — MRSA PCR SCREENING: MRSA by PCR: NEGATIVE

## 2016-10-06 LAB — BRAIN NATRIURETIC PEPTIDE: B Natriuretic Peptide: 3118.3 pg/mL — ABNORMAL HIGH (ref 0.0–100.0)

## 2016-10-06 MED ORDER — ALBUTEROL SULFATE (2.5 MG/3ML) 0.083% IN NEBU: 2.5000 mg | INHALATION_SOLUTION | Freq: Four times a day (QID) | RESPIRATORY_TRACT | Status: DC | PRN

## 2016-10-06 MED ORDER — HYDROXYZINE HCL 25 MG PO TABS: 25.0000 mg | ORAL_TABLET | Freq: Three times a day (TID) | ORAL | Status: DC | PRN

## 2016-10-06 MED ORDER — CLONIDINE HCL 0.3 MG/24HR TD PTWK: 0.3000 mg | MEDICATED_PATCH | TRANSDERMAL | Status: DC

## 2016-10-06 MED ORDER — ACETAMINOPHEN 650 MG RE SUPP: 650.0000 mg | Freq: Four times a day (QID) | RECTAL | Status: DC | PRN

## 2016-10-06 MED ORDER — ONDANSETRON HCL 4 MG/2ML IJ SOLN: 4.0000 mg | Freq: Four times a day (QID) | INTRAMUSCULAR | Status: DC | PRN

## 2016-10-06 MED ORDER — LEVETIRACETAM 500 MG PO TABS
500.0000 mg | ORAL_TABLET | Freq: Every day | ORAL | Status: DC
  Administered 2016-10-07 – 2016-10-08 (×2): 500 mg via ORAL
  Filled 2016-10-06 (×2): qty 1

## 2016-10-06 MED ORDER — HYDRALAZINE HCL 50 MG PO TABS
100.0000 mg | ORAL_TABLET | Freq: Three times a day (TID) | ORAL | Status: DC
  Administered 2016-10-07 – 2016-10-08 (×4): 100 mg via ORAL
  Filled 2016-10-06 (×2): qty 1
  Filled 2016-10-06: qty 2
  Filled 2016-10-06: qty 1
  Filled 2016-10-06 (×3): qty 2

## 2016-10-06 MED ORDER — NA FERRIC GLUC CPLX IN SUCROSE 12.5 MG/ML IV SOLN
125.0000 mg | INTRAVENOUS | Status: DC
  Filled 2016-10-06: qty 10

## 2016-10-06 MED ORDER — HYDRALAZINE HCL 20 MG/ML IJ SOLN
5.0000 mg | Freq: Four times a day (QID) | INTRAMUSCULAR | Status: DC | PRN
  Administered 2016-10-06 – 2016-10-07 (×2): 5 mg via INTRAVENOUS
  Filled 2016-10-06: qty 1

## 2016-10-06 MED ORDER — HEPARIN SODIUM (PORCINE) 5000 UNIT/ML IJ SOLN: 5000.0000 [IU] | Freq: Three times a day (TID) | INTRAMUSCULAR | Status: DC

## 2016-10-06 MED ORDER — HYDRALAZINE HCL 20 MG/ML IJ SOLN: 5.0000 mg | Freq: Three times a day (TID) | INTRAMUSCULAR | Status: DC | PRN

## 2016-10-06 MED ORDER — HYDRALAZINE HCL 20 MG/ML IJ SOLN
INTRAMUSCULAR | Status: AC
  Administered 2016-10-06: 5 mg via INTRAVENOUS
  Filled 2016-10-06: qty 1

## 2016-10-06 MED ORDER — TOPIRAMATE 25 MG PO TABS
50.0000 mg | ORAL_TABLET | Freq: Two times a day (BID) | ORAL | Status: DC
  Administered 2016-10-07 – 2016-10-08 (×4): 50 mg via ORAL
  Filled 2016-10-06 (×4): qty 2

## 2016-10-06 MED ORDER — DOLUTEGRAVIR SODIUM 50 MG PO TABS: 50.0000 mg | ORAL_TABLET | Freq: Every day | ORAL | Status: DC

## 2016-10-06 MED ORDER — ISOSORBIDE MONONITRATE ER 60 MG PO TB24
60.0000 mg | ORAL_TABLET | Freq: Every day | ORAL | Status: DC
  Administered 2016-10-07 – 2016-10-08 (×2): 60 mg via ORAL
  Filled 2016-10-06 (×2): qty 1

## 2016-10-06 MED ORDER — LOSARTAN POTASSIUM 50 MG PO TABS
100.0000 mg | ORAL_TABLET | Freq: Every day | ORAL | Status: DC
  Administered 2016-10-07 – 2016-10-08 (×2): 100 mg via ORAL
  Filled 2016-10-06 (×2): qty 2

## 2016-10-06 MED ORDER — DOLUTEGRAVIR SODIUM 50 MG PO TABS
50.0000 mg | ORAL_TABLET | Freq: Every day | ORAL | Status: DC
  Administered 2016-10-07: 50 mg via ORAL
  Filled 2016-10-06: qty 1

## 2016-10-06 MED ORDER — EMTRICITABINE-TENOFOVIR AF 200-25 MG PO TABS
1.0000 | ORAL_TABLET | Freq: Every day | ORAL | Status: DC
  Administered 2016-10-07: 1 via ORAL
  Filled 2016-10-06: qty 1

## 2016-10-06 MED ORDER — GABAPENTIN 100 MG PO CAPS
100.0000 mg | ORAL_CAPSULE | Freq: Every day | ORAL | Status: DC
  Administered 2016-10-07 – 2016-10-08 (×2): 100 mg via ORAL
  Filled 2016-10-06 (×2): qty 1

## 2016-10-06 MED ORDER — FAMOTIDINE 20 MG PO TABS: 20.0000 mg | ORAL_TABLET | Freq: Two times a day (BID) | ORAL | Status: DC | PRN

## 2016-10-06 MED ORDER — LEVETIRACETAM 250 MG PO TABS
250.0000 mg | ORAL_TABLET | ORAL | Status: DC
  Administered 2016-10-07: 250 mg via ORAL
  Filled 2016-10-06: qty 1

## 2016-10-06 MED ORDER — ACETAMINOPHEN 500 MG PO TABS: 1000.0000 mg | ORAL_TABLET | Freq: Four times a day (QID) | ORAL | Status: DC | PRN

## 2016-10-06 MED ORDER — ONDANSETRON HCL 4 MG PO TABS: 4.0000 mg | ORAL_TABLET | Freq: Four times a day (QID) | ORAL | Status: DC | PRN

## 2016-10-06 MED ORDER — ACETAMINOPHEN 325 MG PO TABS: 650.0000 mg | ORAL_TABLET | Freq: Four times a day (QID) | ORAL | Status: DC | PRN

## 2016-10-06 MED ORDER — EMTRICITABINE-TENOFOVIR AF 200-25 MG PO TABS: 1.0000 | ORAL_TABLET | Freq: Every day | ORAL | Status: DC

## 2016-10-06 MED ORDER — DAPSONE 100 MG PO TABS
100.0000 mg | ORAL_TABLET | Freq: Every day | ORAL | Status: DC
  Administered 2016-10-07 – 2016-10-08 (×2): 100 mg via ORAL
  Filled 2016-10-06 (×2): qty 1

## 2016-10-06 MED ORDER — DARBEPOETIN ALFA 200 MCG/0.4ML IJ SOSY
200.0000 ug | PREFILLED_SYRINGE | INTRAMUSCULAR | Status: DC
  Administered 2016-10-07: 200 ug via INTRAVENOUS
  Filled 2016-10-06: qty 0.4

## 2016-10-06 MED ORDER — LEVETIRACETAM 500 MG PO TABS
500.0000 mg | ORAL_TABLET | Freq: Every day | ORAL | Status: DC
Start: 2016-10-07 — End: 2016-10-06

## 2016-10-06 MED ORDER — HYDROCODONE-ACETAMINOPHEN 5-325 MG PO TABS
1.0000 | ORAL_TABLET | ORAL | Status: DC | PRN
  Administered 2016-10-06: 2 via ORAL
  Administered 2016-10-06: 1 via ORAL
  Administered 2016-10-07 – 2016-10-08 (×5): 2 via ORAL
  Filled 2016-10-06 (×3): qty 2
  Filled 2016-10-06: qty 1
  Filled 2016-10-06: qty 2

## 2016-10-06 NOTE — Progress Notes (Signed)
CRITICAL VALUE ALERT  Critical Value:  Troponin 0.16  Date & Time Notied:  10/06/16 5:52 PM  Provider Notified: Page to [place], [place] and Shawn Route, PA  Orders Received/Actions taken: none. Pt in dialysis at this time.

## 2016-10-06 NOTE — ED Notes (Signed)
Pt c/o increasing shortness of breath-- went to dialysis yesterday-

## 2016-10-06 NOTE — Progress Notes (Signed)
Patient refused CPAP unit for the night

## 2016-10-06 NOTE — ED Triage Notes (Signed)
Pt arrived by [facility] for sob. Dialysis pt t/th/sat. Has not missed any treatments. Reports recent change in dry weight. Hypertensive pta, spo2 98%.

## 2016-10-06 NOTE — Progress Notes (Signed)
ANTICOAGULATION CONSULT NOTE - Initial Consult  Pharmacy Consult for heparin  Indication: VTE prophylaxis  Allergies  Allergen Reactions  . Bactrim [Sulfamethoxazole-Trimethoprim] Other (See Comments)    Renal failure  . Onion Hives and Other (See Comments)    Per allergy test  . Procardia [Nifedipine] Hives, Shortness Of Breath and Itching  . Reglan [Metoclopramide] Other (See Comments)    Causes ticks/ jerks  . Vancomycin Other (See Comments)    Patient states vancomycin caused kidney injury  . Metoprolol Hives  . Amlodipine Palpitations and Other (See Comments)    Causes fatigue  . Augmentin [Amoxicillin-Pot Clavulanate] Hives    Patient Measurements:   Vital Signs: Temp: 98.2 F (36.8 C) (08/10 1112) Temp Source: Oral (08/10 1112) BP: 201/104 (08/10 1245) Pulse Rate: 114 (08/10 1245)  Labs:  Recent Labs  10/06/16 1119  HGB 10.2*  HCT 32.1*  PLT 178  CREATININE 9.89*    Estimated Creatinine Clearance: 9.7 mL/min (A) (by C-G formula based on SCr of 9.89 mg/dL (H)).   Medical History: Past Medical History:  Diagnosis Date  . Asthma   . CAP (community acquired pneumonia) 10/2013  . Cellulitis 11/2013   LLE  . DVT (deep venous thrombosis) ([place]) 10/2013   BLE  . 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  Went on HD Dec 2016.   [name] Fever   . Headache   . HIV infection ([place]) dx'd 10/2013  . Hypertension   . Leg pain   . Multiple environmental allergies    "trees, dogs, cats"  . Multiple food allergies   . Seizures ([place])    last seizure per pt in Jan  . Shingles < 2010    Assessment: 36 yo male to start heparin for VTE prophylaxis in setting ESRD.   Goal of Therapy:  Monitor platelets by anticoagulation protocol: Yes   Plan:  1. Heparin 5000 units subq every 8  hours 2. Follow peripherally  Vincenza Hews, PharmD, BCPS 10/06/2016, 2:09 PM

## 2016-10-06 NOTE — Progress Notes (Signed)
Patient report from Hemodialysis nurse, pulled of 2.2L until patient started cramping, BP was up and RN gave hydralazine.

## 2016-10-06 NOTE — ED Notes (Signed)
Report called to rn on 3e 
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
## 2016-10-06 NOTE — ED Provider Notes (Signed)
[facility] DEPT Provider Note   CSN: [id number] Arrival date & time: 10/06/16  1106     History   Chief Complaint Chief Complaint  Patient presents with  . Shortness of Breath    HPI Jim Wyatt is a 36 y.o. male.  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 He went to his hemodialysis session yesterday and stayed for 3.75 hours, which is normal for him. The shortness of breath began last night and is worse with laying down. He also has been experiencing 4/10 chest pain since this morning. He is drwosy upon interview, but easily rousable and able to answer questions appropriately. He has been admitted 2 times in 2 months for similar symptoms; most recently on 09/27/2016. At his last admission he required 2 days back to back of dialysis.       Past Medical History:  Diagnosis Date  . Asthma   . CAP (community acquired pneumonia) 10/2013  . Cellulitis 11/2013   LLE  . DVT (deep venous thrombosis) ([place]) 10/2013   BLE  . 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  Went on HD Dec 2016.   [name] Fever   . Headache   . HIV infection ([place]) dx'd 10/2013  . Hypertension   . Leg pain   . Multiple environmental allergies    "trees, dogs, cats"  . Multiple food allergies   . Seizures ([place])    last seizure per pt in Jan  . Shingles < 2010    Patient Active Problem List   Diagnosis Date Noted  . Fluid overload 09/27/2016  . Volume overload 08/19/2016  . Acne-like skin bumps 07/19/2016  . Neuropathic pain 07/05/2016  . Pruritus 07/05/2016  . Anasarca   . Hypervolemia   . Chronic diarrhea   . ESRD on hemodialysis ([place])   . Acute pulmonary edema (HCC)   . SIRS (systemic inflammatory response syndrome)  ([place]) 05/30/2016  . Acute respiratory failure ([place]) 05/30/2016  . 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 Noncompliance   . End stage renal disease ([place])   . Chest pressure   . Palliative care encounter   . Goals of care, counseling/discussion   . Shortness of breath 05/12/2016  . Dyspnea   . Hypertensive emergency   . Pericardial effusion   . Tachycardia   . HIV disease ([place])   . OSA (obstructive sleep apnea) 04/19/2016  . Hospital discharge follow-up 04/19/2016  . Depression 03/24/2016  . Breast lump or mass   . Rectal bleeding   . GI bleed 03/15/2016  . Hyperkalemia   . New onset seizure ([place]) 03/13/2016  . Symptomatic anemia 02/01/2016  . Dysuria 01/18/2016  . Hematochezia 01/09/2016  . Postoperative anemia due to acute blood loss 01/09/2016  . Seizure disorder ([place]) 12/29/2015  . Intussusception ([place]) 12/28/2015  . Hypertension 12/28/2015  . Otitis externa of both ears 11/21/2015  . AIDS (acquired immune deficiency syndrome) ([place])   . Meningitis   . Cephalalgia   . Blurry vision, bilateral   . Headache   . Hypertensive urgency   . New onset of headache in immunocompromised patient ([place]) 07/06/2015  . ESRD on dialysis ([place]) 04/27/2015  .  CAP (community acquired pneumonia) 02/25/2015  . ESRD (end stage renal disease) ([place]) 02/16/2015  . History of DVT (deep vein thrombosis) 01/12/2015  . Candidal esophagitis ([place])   . Constipation   . Nausea without vomiting 12/20/2014  . Abscess of right thigh 12/19/2014  . Chronic kidney disease (CKD), stage IV (severe) ([place]) 12/19/2014  . 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 Anemia due to end stage renal disease ([place]) 12/19/2014  . Hyponatremia 12/19/2014  . AIDS ([place])   . Community acquired pneumonia 07/30/2014  . Anemia of chronic disease 07/30/2014  . Acute on chronic renal failure ([place])   . 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 CMV (cytomegalovirus infection) ([place]) 12/07/2013  .  Protein-calorie malnutrition, severe ([place]) 12/06/2013  . Human immunodeficiency virus (HIV) disease ([place]) 11/27/2013  . 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 DVT (deep venous thrombosis) ([place]) 11/24/2013  . Asthma, chronic 11/24/2013  . Acquired immune deficiency syndrome ([place]) 11/14/2013  . AKI (acute kidney injury) ([place]) 11/11/2013  . Elevated liver enzymes 11/09/2013  . History of shingles 11/09/2013  . Obesity 11/09/2013  . Abdominal pain 11/07/2013  . Asthma     Past Surgical History:  Procedure Laterality Date  . AV FISTULA PLACEMENT Left 03/03/2015   Procedure: Creation of Left Arm RADIOCEPHALIC ARTERIOVENOUS FISTULA ;  Surgeon: Mal Misty, MD;  Location: [facility];  Service: Vascular;  Laterality: Left;  . COLONOSCOPY N/A 03/18/2016   Procedure: COLONOSCOPY;  Surgeon: Manus Gunning, MD;  Location: [facility];  Service: Gastroenterology;  Laterality: N/A;  . FEMUR FRACTURE SURGERY Right 09/2001  . FRACTURE SURGERY    . I&D EXTREMITY Right 12/18/2014   Procedure: IRRIGATION AND DEBRIDEMENT RIGHT LOWER EXTREMITY;  Surgeon: Ralene Ok, MD;  Location: [facility];  Service: General;  Laterality: Right;  . INSERTION OF DIALYSIS CATHETER  2016  . LAPAROTOMY N/A 12/28/2015   Procedure: EXPLORATORY LAPAROTOMY BOWEL RESECTION ILEOCECECTOMY;  Surgeon: Clovis Riley, MD;  Location: [facility];  Service: General;  Laterality: N/A;       Home Medications    Prior to Admission medications   Medication Sig Start Date End Date Taking? Authorizing Provider  acetaminophen (TYLENOL) 500 MG tablet Take 1,000 mg by mouth every 6 (six) hours as needed (pain).    [provider]  albuterol (PROVENTIL HFA;VENTOLIN HFA) 108 (90 Base) MCG/ACT inhaler Inhale 2 puffs into the lungs every 6 (six) hours as needed for wheezing or shortness of breath. 06/22/16   Smiley Houseman, MD  cloNIDine (CATAPRES - DOSED IN MG/24 HR) 0.3 mg/24hr  patch Place 1 patch (0.3 mg total) onto the skin every Sunday. 10/01/16   Waldron Session, MD  dapsone 100 MG tablet Take 1 tablet (100 mg total) by mouth daily. 07/28/16   Truman Hayward, MD  dolutegravir (TIVICAY) 50 MG tablet Take 1 tablet (50 mg total) by mouth daily. 06/02/16   Truman Hayward, MD  emtricitabine-tenofovir AF (DESCOVY) 200-25 MG tablet Take 1 tablet by mouth daily. 06/02/16   Truman Hayward, MD  famotidine (PEPCID) 20 MG tablet Take 1 tablet (20 mg total) by mouth 2 (two) times daily. Patient taking differently: Take 20 mg by mouth 2 (two) times daily as needed for heartburn (acid reflux).  08/03/16   Kirichenko, Tatyana, PA-C  fluticasone (FLONASE) 50 MCG/ACT nasal spray Place 2 sprays into both nostrils daily. Patient not taking: Reported on 08/19/2016 08/02/16   Horton, Barbette Hair, MD  gabapentin (  NEURONTIN) 100 MG capsule Take 1 capsule (100 mg total) by mouth 3 (three) times daily. Patient taking differently: Take 100 mg by mouth daily.  07/05/16   [name], MD  hydrALAZINE (APRESOLINE) 100 MG tablet Take 100-200 mg by mouth See admin instructions. Take 1 tablet (100 mg) by mouth three times daily, take 2 tablets (200 mg) for SBP >190    [provider]  hydrALAZINE (APRESOLINE) 50 MG tablet Take 2 tablets (100 mg total) by mouth 3 (three) times daily. 06/22/16   Smiley Houseman, MD  hydrOXYzine (ATARAX/VISTARIL) 25 MG tablet Take 1 tablet (25 mg total) by mouth 3 (three) times daily as needed for itching. Patient taking differently: Take 25 mg by mouth 2 (two) times daily.  07/05/16   [name], MD  isosorbide mononitrate (IMDUR) 60 MG 24 hr tablet Take 1 tablet (60 mg total) by mouth daily. 08/23/16   Valinda Party, DO  levETIRAcetam (KEPPRA) 250 MG tablet Take 1 tablet (250 mg) by mouth on Tuesday, Thursday, Saturday at 7pm Patient taking differently: Take 250 mg by mouth See admin instructions. Take one tablet (250 mg) by mouth  every Tuesday, Thursday and Saturday at 7 pm 03/13/16   Cameron Sprang, MD  levETIRAcetam (KEPPRA) 500 MG tablet Take 500 mg by mouth daily.    [provider]  losartan (COZAAR) 100 MG tablet Take 100 mg by mouth daily.     [provider]  pantoprazole (PROTONIX) 40 MG tablet Take 1 tablet (40 mg total) by mouth daily. Patient not taking: Reported on 08/19/2016 06/22/16   [place] Bing, DO  sodium chloride (OCEAN) 0.65 % SOLN nasal spray Place 1 spray into both nostrils as needed for congestion. Patient not taking: Reported on 08/19/2016 08/02/16   Horton, Barbette Hair, MD  sucralfate (CARAFATE) 1 GM/10ML suspension Take 10 mLs (1 g total) by mouth 4 (four) times daily -  with meals and at bedtime. Patient not taking: Reported on 08/14/2016 06/22/16   [place] Bing, DO  topiramate (TOPAMAX) 50 MG tablet Take 1 tablet (50 mg total) by mouth 2 (two) times daily. 06/22/16   Smiley Houseman, MD  triamcinolone ointment (KENALOG) 0.5 % Apply 1 application topically 2 (two) times daily. Patient taking differently: Apply 1 application topically 2 (two) times daily as needed (rash/ itching).  07/19/16   [name], MD    Family History Family History  Problem Relation Age of Onset  . Hypertension Mother   . Diabetes Maternal Grandmother   . Kidney disease Maternal Grandmother     Social History Social History  Substance Use Topics  . Smoking status: Never Smoker  . Smokeless tobacco: Never Used  . Alcohol use No     Comment: I stopped drinking along time ago "     Allergies   Bactrim [sulfamethoxazole-trimethoprim]; Onion; Procardia [nifedipine]; Reglan [metoclopramide]; Vancomycin; Metoprolol; Amlodipine; and Augmentin [amoxicillin-pot clavulanate]   Review of Systems Review of Systems  Constitutional: Negative for chills and fever.  HENT: Negative for ear pain and sore throat.   Eyes: Negative for pain and visual disturbance.  Respiratory: Positive  for shortness of breath. Negative for wheezing.   Cardiovascular: Positive for chest pain. Negative for palpitations.  Gastrointestinal: Negative for abdominal pain and vomiting.  Genitourinary: Negative for dysuria and hematuria.  Musculoskeletal: Positive for neck pain. Negative for arthralgias and back pain.  Skin: Negative for color change and rash.  Neurological: Negative for seizures and  syncope.  All other systems reviewed and are negative.    Physical Exam Updated Vital Signs BP (!) 201/104   Pulse (!) 114   Temp 98.2 F (36.8 C) (Oral)   Resp 19   SpO2 97%   Physical Exam  Constitutional: He is oriented to person, place, and time. He appears well-developed and well-nourished.  HENT:  Head: Normocephalic and atraumatic.  Eyes: Conjunctivae are normal.  Neck: Neck supple.  Cardiovascular: Regular rhythm.   No murmur heard. Tachcardic  Pulmonary/Chest: Effort normal. No respiratory distress.  Bibasilar Rales  Abdominal: Soft. There is no tenderness.  Musculoskeletal: He exhibits no edema.  Neurological: He is alert and oriented to person, place, and time.  Skin: Skin is warm and dry.  Psychiatric: He has a normal mood and affect.  Nursing note and vitals reviewed.    ED Treatments / Results  Labs (all labs ordered are listed, but only abnormal results are displayed) Labs Reviewed  BASIC METABOLIC PANEL - Abnormal; Notable for the following:       Result Value   Chloride 93 (*)    BUN 46 (*)    Creatinine, Ser 9.89 (*)    GFR calc non Af Amer 6 (*)    GFR calc Af Amer 7 (*)    All other components within normal limits  CBC - Abnormal; Notable for the following:    RBC 3.39 (*)    Hemoglobin 10.2 (*)    HCT 32.1 (*)    RDW 16.9 (*)    All other components within normal limits  I-STAT TROPONIN, ED - Abnormal; Notable for the following:    Troponin i, poc 0.15 (*)    All other components within normal limits  BRAIN NATRIURETIC PEPTIDE    EKG  EKG  Interpretation None       Radiology Dg Chest 2 View  Result Date: 10/06/2016 CLINICAL DATA:  Shortness of breath.  End-stage renal disease. EXAM: CHEST  2 VIEW COMPARISON:  09/27/2016 FINDINGS: Chronic cardiomegaly. There is interstitial coarsening and fissural thickening. No effusion or air bronchogram. No pneumothorax. IMPRESSION: Cardiomegaly and mild edema. Electronically Signed   By: Monte Fantasia M.D.   On: 10/06/2016 11:30    Procedures Procedures (including critical care time)  Medications Ordered in ED Medications - No data to display   Initial Impression / Assessment and Plan / ED Course  I have reviewed the triage vital signs and the nursing notes.  Pertinent labs & imaging results that were available during my care of the patient were reviewed by me and considered in my medical decision making (see chart for details).    Increasing shortness of breath in a patient on dialysis with 2 recent admissions for volume overload requiring urgent dialysis in the pat 2 months. EKG shows sinus tachycardia. CXR shows cardiomegaly and mild edema. - CBC: Hgb 10.2; no significant abnormalities - BMP: consistent with ESRD - Troponin: 0.15  - BNP: 3,118  Troponin and BNP consistent with strain and similar to results from prior admissions. Nephrology consulted. Hospitalist consulted, patient to be admitted for urgent hemodialysis.   Final Clinical Impressions(s) / ED Diagnoses   Final diagnoses:  Other hypervolemia  Acute pulmonary edema (HCC)  SOB (shortness of breath)    New Prescriptions New Prescriptions   No medications on file     Neva Seat, MD 10/06/16 1359    Mesner, Corene Cornea, MD 10/06/16 1450

## 2016-10-06 NOTE — H&P (Signed)
History and Physical    Jim Wyatt YSA:[id number] DOB: [birth year] DOA: 10/06/2016   PCP: [name], MD   Patient coming from:  Home    Chief Complaint: Shortness of breath   HPI: Jim Wyatt is a 36 y.o. 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 The patient had his last hemodialysis session yesterday, stating that he had 3. and three quarters hours which is normal for him. Shortness of breath began after dialysis, and it was worse when lying down. The patient may have been experiencing chest pain on presentation, but he does not report chest pain to me. The patient is seek appearing, and is not cooperative with information, easily browsable, and able to answer these questions appropriately. He is not confused. Of note, the patient has been admitted to times over the last 2 months with similar symptoms, most recently on 09/27/2016. And the last admission, he required 2 days back to back of dialysis. Today, his symptoms appeared to be similar. He denies fever chills or night sweats. He denies any sick contacts. He is compliant with his medications. He denies any nausea or vomiting. He does have chronic diarrhea, but this is not worsened. The patient denies any increase lower extremity swelling. He denies any calf pain.   ED Course:  BP (!) 201/104   Pulse (!) 114   Temp 98.2 F (36.8 C) (Oral)   Resp 19   SpO2 97%    creatinine 9.89 with GFR of 7 hemoglobin 10.2 hematocrit 32.1 troponin 0.15 BNP 3118, in the setting of ESRB for dialysis. Of note, the values are similar to those of prior admissions.  EKG shows sinus tachycardia. Chest x-ray shows cardiomegaly with mild edema. He is being admitted for urgent dialysis.  Review of Systems:  As per HPI otherwise all other  systems reviewed and are negative  Past Medical History:  Diagnosis Date  . Asthma   . CAP (community acquired pneumonia) 10/2013  . Cellulitis 11/2013   LLE  . DVT (deep venous thrombosis) ([place]) 10/2013   BLE  . ESRD on hemodialysis ([place]) 01/2015   AKI with nephrotic syndrome (16gm proteinuria at time of biopsy, from collapsing FSG). 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  Went on HD Dec 2016.   [name] Fever   . Headache   . HIV infection ([place]) dx'd 10/2013  . Hypertension   . Leg pain   . Multiple environmental allergies    "trees, dogs, cats"  . Multiple food allergies   . Seizures ([place])    last seizure per pt in Jan  . Shingles < 2010    Past Surgical History:  Procedure Laterality Date  . AV FISTULA PLACEMENT Left 03/03/2015   Procedure: Creation of Left Arm RADIOCEPHALIC ARTERIOVENOUS FISTULA ;  Surgeon: Mal Misty, MD;  Location: [facility];  Service: Vascular;  Laterality: Left;  . COLONOSCOPY N/A 03/18/2016   Procedure: COLONOSCOPY;  Surgeon: Manus Gunning, MD;  Location: [facility];  Service: Gastroenterology;  Laterality: N/A;  . FEMUR FRACTURE SURGERY Right 09/2001  . FRACTURE SURGERY    . I&D EXTREMITY Right 12/18/2014   Procedure: IRRIGATION AND DEBRIDEMENT RIGHT LOWER EXTREMITY;  Surgeon: Ralene Ok, MD;  Location: [facility];  Service:  General;  Laterality: Right;  . INSERTION OF DIALYSIS CATHETER  2016  . LAPAROTOMY N/A 12/28/2015   Procedure: EXPLORATORY LAPAROTOMY BOWEL RESECTION ILEOCECECTOMY;  Surgeon: Clovis Riley, MD;  Location: [facility];  Service: General;  Laterality: N/A;    Social History Social History   Social History  . Marital status: Single    Spouse name: N/A  . Number of children: N/A  . Years of education: N/A   Occupational History  . Not on file.   Social History Main Topics  . Smoking status: Never Smoker  . Smokeless tobacco: Never Used  . Alcohol use No     Comment:  I stopped drinking along time ago "  . Drug use: No  . Sexual activity: Not Currently    Partners: Female    Birth control/ protection: Condom   Other Topics Concern  . Not on file   Social History Narrative  . No narrative on file     Allergies  Allergen Reactions  . Bactrim [Sulfamethoxazole-Trimethoprim] Other (See Comments)    Renal failure  . Onion Hives and Other (See Comments)    Per allergy test  . Procardia [Nifedipine] Hives, Shortness Of Breath and Itching  . Reglan [Metoclopramide] Other (See Comments)    Causes ticks/ jerks  . Vancomycin Other (See Comments)    Patient states vancomycin caused kidney injury  . Metoprolol Hives  . Amlodipine Palpitations and Other (See Comments)    Causes fatigue  . Augmentin [Amoxicillin-Pot Clavulanate] Hives    Family History  Problem Relation Age of Onset  . Hypertension Mother   . Diabetes Maternal Grandmother   . Kidney disease Maternal Grandmother       Prior to Admission medications   Medication Sig Start Date End Date Taking? Authorizing Provider  acetaminophen (TYLENOL) 500 MG tablet Take 1,000 mg by mouth every 6 (six) hours as needed (pain).    [provider]  albuterol (PROVENTIL HFA;VENTOLIN HFA) 108 (90 Base) MCG/ACT inhaler Inhale 2 puffs into the lungs every 6 (six) hours as needed for wheezing or shortness of breath. 06/22/16   Smiley Houseman, MD  cloNIDine (CATAPRES - DOSED IN MG/24 HR) 0.3 mg/24hr patch Place 1 patch (0.3 mg total) onto the skin every Sunday. 10/01/16   Waldron Session, MD  dapsone 100 MG tablet Take 1 tablet (100 mg total) by mouth daily. 07/28/16   Truman Hayward, MD  dolutegravir (TIVICAY) 50 MG tablet Take 1 tablet (50 mg total) by mouth daily. 06/02/16   Truman Hayward, MD  emtricitabine-tenofovir AF (DESCOVY) 200-25 MG tablet Take 1 tablet by mouth daily. 06/02/16   Truman Hayward, MD  famotidine (PEPCID) 20 MG tablet Take 1 tablet (20 mg total) by mouth 2 (two)  times daily. Patient taking differently: Take 20 mg by mouth 2 (two) times daily as needed for heartburn (acid reflux).  08/03/16   Kirichenko, Tatyana, PA-C  fluticasone (FLONASE) 50 MCG/ACT nasal spray Place 2 sprays into both nostrils daily. Patient not taking: Reported on 08/19/2016 08/02/16   Horton, Barbette Hair, MD  gabapentin (NEURONTIN) 100 MG capsule Take 1 capsule (100 mg total) by mouth 3 (three) times daily. Patient taking differently: Take 100 mg by mouth daily.  07/05/16   [name], MD  hydrALAZINE (APRESOLINE) 100 MG tablet Take 100-200 mg by mouth See admin instructions. Take 1 tablet (100 mg) by mouth three times daily, take 2 tablets (200 mg) for SBP >190  [provider]  hydrALAZINE (APRESOLINE) 50 MG tablet Take 2 tablets (100 mg total) by mouth 3 (three) times daily. 06/22/16   Smiley Houseman, MD  hydrOXYzine (ATARAX/VISTARIL) 25 MG tablet Take 1 tablet (25 mg total) by mouth 3 (three) times daily as needed for itching. Patient taking differently: Take 25 mg by mouth 2 (two) times daily.  07/05/16   [name], MD  isosorbide mononitrate (IMDUR) 60 MG 24 hr tablet Take 1 tablet (60 mg total) by mouth daily. 08/23/16   Valinda Party, DO  levETIRAcetam (KEPPRA) 250 MG tablet Take 1 tablet (250 mg) by mouth on Tuesday, Thursday, Saturday at 7pm Patient taking differently: Take 250 mg by mouth See admin instructions. Take one tablet (250 mg) by mouth every Tuesday, Thursday and Saturday at 7 pm 03/13/16   Cameron Sprang, MD  levETIRAcetam (KEPPRA) 500 MG tablet Take 500 mg by mouth daily.    [provider]  losartan (COZAAR) 100 MG tablet Take 100 mg by mouth daily.     [provider]  pantoprazole (PROTONIX) 40 MG tablet Take 1 tablet (40 mg total) by mouth daily. Patient not taking: Reported on 08/19/2016 06/22/16   Burien Bing, DO  sodium chloride (OCEAN) 0.65 % SOLN nasal spray Place 1 spray into both nostrils as  needed for congestion. Patient not taking: Reported on 08/19/2016 08/02/16   Horton, Barbette Hair, MD  sucralfate (CARAFATE) 1 GM/10ML suspension Take 10 mLs (1 g total) by mouth 4 (four) times daily -  with meals and at bedtime. Patient not taking: Reported on 08/14/2016 06/22/16   [place] Bing, DO  topiramate (TOPAMAX) 50 MG tablet Take 1 tablet (50 mg total) by mouth 2 (two) times daily. 06/22/16   Smiley Houseman, MD  triamcinolone ointment (KENALOG) 0.5 % Apply 1 application topically 2 (two) times daily. Patient taking differently: Apply 1 application topically 2 (two) times daily as needed (rash/ itching).  07/19/16   [name], MD    Physical Exam:  Vitals:   10/06/16 1215 10/06/16 1230 10/06/16 1245 10/06/16 1258  BP: (!) 201/110 (!) 194/91 (!) 201/104   Pulse: (!) 108 (!) 107 (!) 114   Resp: 15 (!) 25 19   Temp:      TempSrc:      SpO2: 91% 94% 95% 97%   Constitutional: Ill appearing, uncomfortable. Eyes: PERRL, lids and conjunctivae normal ENMT: Mucous membranes are moist, without exudate or lesions  Neck: normal, supple, no masses, no thyromegaly Respiratory: bite basilar crackles, no wheezing or rhonchi. Normal respiratory effort  Cardiovascular: tachycardic,no murmurs, rubs or gallops. Trace all bilateral lower extremity edema. 2+ pedal pulses. No carotid bruits.  Abdomen: Soft, non tender, No hepatosplenomegaly. Bowel sounds positive.  Musculoskeletal: no clubbing / cyanosis. Moves all extremities Skin: no jaundice, No lesions.  Neurologic: Sensation intact  Strength equal in all extremities Psychiatric:   Alert and oriented x 3. Somewhat lethargic    Labs on Admission: I have personally reviewed following labs and imaging studies  CBC:  Recent Labs Lab 10/06/16 1119  WBC 7.1  HGB 10.2*  HCT 32.1*  MCV 94.7  PLT 147    Basic Metabolic Panel:  Recent Labs Lab 10/06/16 1119  NA 135  K 3.7  CL 93*  CO2 29  GLUCOSE 96  BUN 46*    CREATININE 9.89*  CALCIUM 9.9    GFR: Estimated Creatinine Clearance: 9.7 mL/min (A) (by C-G formula based on SCr  of 9.89 mg/dL (H)).  Liver Function Tests: No results for input(s): AST, ALT, ALKPHOS, BILITOT, PROT, ALBUMIN in the last 168 hours. No results for input(s): LIPASE, AMYLASE in the last 168 hours. No results for input(s): AMMONIA in the last 168 hours.  Coagulation Profile: No results for input(s): INR, PROTIME in the last 168 hours.  Cardiac Enzymes: No results for input(s): CKTOTAL, CKMB, CKMBINDEX, TROPONINI in the last 168 hours.  BNP (last 3 results) No results for input(s): PROBNP in the last 8760 hours.  HbA1C: No results for input(s): HGBA1C in the last 72 hours.  CBG: No results for input(s): GLUCAP in the last 168 hours.  Lipid Profile: No results for input(s): CHOL, HDL, LDLCALC, TRIG, CHOLHDL, LDLDIRECT in the last 72 hours.  Thyroid Function Tests: No results for input(s): TSH, T4TOTAL, FREET4, T3FREE, THYROIDAB in the last 72 hours.  Anemia Panel: No results for input(s): VITAMINB12, FOLATE, FERRITIN, TIBC, IRON, RETICCTPCT in the last 72 hours.  Urine analysis:    Component Value Date/Time   COLORURINE YELLOW 06/02/2016 0753   APPEARANCEUR HAZY (A) 06/02/2016 0753   LABSPEC 1.014 06/02/2016 0753   PHURINE 9.0 (H) 06/02/2016 0753   GLUCOSEU >=500 (A) 06/02/2016 0753   HGBUR NEGATIVE 06/02/2016 0753   BILIRUBINUR NEGATIVE 06/02/2016 0753   BILIRUBINUR Negative 01/19/2016 0934   KETONESUR NEGATIVE 06/02/2016 0753   PROTEINUR >=300 (A) 06/02/2016 0753   UROBILINOGEN 0.2 01/19/2016 0934   UROBILINOGEN 0.2 12/18/2014 1925   NITRITE NEGATIVE 06/02/2016 0753   LEUKOCYTESUR NEGATIVE 06/02/2016 0753    Sepsis Labs: @LABRCNTIP (procalcitonin:4,lacticidven:4) )No results found for this or any previous visit (from the past 240 hour(s)).   Radiological Exams on Admission: Dg Chest 2 View  Result Date: 10/06/2016 CLINICAL DATA:  Shortness of  breath.  End-stage renal disease. EXAM: CHEST  2 VIEW COMPARISON:  09/27/2016 FINDINGS: Chronic cardiomegaly. There is interstitial coarsening and fissural thickening. No effusion or air bronchogram. No pneumothorax. IMPRESSION: Cardiomegaly and mild edema. Electronically Signed   By: Monte Fantasia M.D.   On: 10/06/2016 11:30    EKG: Independently reviewed.  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 Patient presenting with progressive worsening shortness of breath, in the setting of ESRDon dialysis, on Tuesday Thursday Saturday (see below). Chest x-ray shows cardiomegaly with mild edema.Last echocardiogram showing EF of 55-60% on 08/20/2016. Low suspicion for pulmonary embolus at this time, the symptoms are believed to be due to fluid accumulation. Of note, the patient had chronically elevated the diamond in the past, with negative CT angiograms BNP 3118, in the setting of ESRD on  dialysis. Of note, the values are similar to those of prior admissions. troponin 0.15. Chest x-ray shows cardiomegaly with mild edema.The patient does not require NRB at this time admit to step down telemetry cycle troponin continues oxygen nasal cannula to  keep 02 greater than 92 EKG in a.m. Strict IO and weights daily  BNP in am MAy benefit of Cards evaluation in am if symptoms do not  improve    ESRD on HD TThS, last on Thu 8/9, 3.5 hduration, Cr greater than 9  , for urgent dialysis today.Lately increasing his need for frequent dialysis  Nephrology involved, Dr. Jonnie Finner notified. Renal Diet. Other plans as per Nephrology Check CMET in am   Hypertensive urgency  BP 201/104   Pulse 114 with intermittent headaches   anti-hypertensive medications are currently on hold, the patient is to undergo dialysis Add Hydralazine Q6 hours as needed for BP 200/100  Anemia of chronic disease Hemoglobin on admission 10.2   Repeat CBC in am  No transfusion is indicated at this time    History of seizures, on Keppra, he is compliant to the med  Continue Keppra  Seizure precaution    History of DVT not on AC at home as above Heparin prophylaxis per Pharmacy   AIDS CD4 count last was done 140 on 09/04/16 Continue HAART   DVT prophylaxis: Heparin    Code Status:   Full   Family Communication:  Discussed with patient Disposition Plan: Expect patient to be discharged to home after condition improves Consults called:   Nephrology  Admission status:  SDU    [facility] E, PA-C [facility]   10/06/2016, 1:28 PM

## 2016-10-06 NOTE — Procedures (Signed)
   I was present at this dialysis session, have reviewed the session itself and made  appropriate changes Kelly Splinter MD [place] pager [phone]   10/06/2016, 5:13 PM

## 2016-10-06 NOTE — Progress Notes (Signed)
Pt cramping in his left leg; UF of for th rest of tx; 100NS bolus given.

## 2016-10-07 ENCOUNTER — Other Ambulatory Visit: Payer: Self-pay

## 2016-10-07 ENCOUNTER — Encounter (HOSPITAL_COMMUNITY): Payer: Self-pay | Admitting: *Deleted

## 2016-10-07 ENCOUNTER — Inpatient Hospital Stay (HOSPITAL_COMMUNITY): Payer: Medicare Other

## 2016-10-07 DIAGNOSIS — I160 Hypertensive urgency: Secondary | ICD-10-CM

## 2016-10-07 DIAGNOSIS — B20 Human immunodeficiency virus [HIV] disease: Secondary | ICD-10-CM

## 2016-10-07 DIAGNOSIS — I5033 Acute on chronic diastolic (congestive) heart failure: Secondary | ICD-10-CM

## 2016-10-07 DIAGNOSIS — Z992 Dependence on renal dialysis: Secondary | ICD-10-CM

## 2016-10-07 DIAGNOSIS — E8770 Fluid overload, unspecified: Secondary | ICD-10-CM

## 2016-10-07 DIAGNOSIS — N186 End stage renal disease: Secondary | ICD-10-CM

## 2016-10-07 LAB — CBC
HCT: 31.1 % — ABNORMAL LOW (ref 39.0–52.0)
Hemoglobin: 10.2 g/dL — ABNORMAL LOW (ref 13.0–17.0)
MCH: 30.8 pg (ref 26.0–34.0)
MCHC: 32.8 g/dL (ref 30.0–36.0)
MCV: 94 fL (ref 78.0–100.0)
Platelets: 189 10*3/uL (ref 150–400)
RBC: 3.31 MIL/uL — ABNORMAL LOW (ref 4.22–5.81)
RDW: 16.6 % — ABNORMAL HIGH (ref 11.5–15.5)
WBC: 7.6 10*3/uL (ref 4.0–10.5)

## 2016-10-07 LAB — COMPREHENSIVE METABOLIC PANEL
ALT: 7 U/L — ABNORMAL LOW (ref 17–63)
AST: 17 U/L (ref 15–41)
Albumin: 3.2 g/dL — ABNORMAL LOW (ref 3.5–5.0)
Alkaline Phosphatase: 69 U/L (ref 38–126)
Anion gap: 12 (ref 5–15)
BUN: 19 mg/dL (ref 6–20)
CO2: 28 mmol/L (ref 22–32)
Calcium: 9.5 mg/dL (ref 8.9–10.3)
Chloride: 94 mmol/L — ABNORMAL LOW (ref 101–111)
Creatinine, Ser: 6.01 mg/dL — ABNORMAL HIGH (ref 0.61–1.24)
GFR calc Af Amer: 13 mL/min — ABNORMAL LOW (ref >60)
GFR calc non Af Amer: 11 mL/min — ABNORMAL LOW (ref >60)
Glucose, Bld: 95 mg/dL (ref 65–99)
Potassium: 3.6 mmol/L (ref 3.5–5.1)
Sodium: 134 mmol/L — ABNORMAL LOW (ref 135–145)
Total Bilirubin: 1.4 mg/dL — ABNORMAL HIGH (ref 0.3–1.2)
Total Protein: 8.3 g/dL — ABNORMAL HIGH (ref 6.5–8.1)

## 2016-10-07 LAB — BRAIN NATRIURETIC PEPTIDE: B Natriuretic Peptide: 3574.9 pg/mL — ABNORMAL HIGH (ref 0.0–100.0)

## 2016-10-07 LAB — PROTIME-INR
INR: 1.04
Prothrombin Time: 13.6 seconds (ref 11.4–15.2)

## 2016-10-07 LAB — TROPONIN I: Troponin I: 0.14 ng/mL (ref <0.03)

## 2016-10-07 MED ORDER — HYDRALAZINE HCL 20 MG/ML IJ SOLN
10.0000 mg | Freq: Four times a day (QID) | INTRAMUSCULAR | Status: DC | PRN
Start: 2016-10-07 — End: 2016-10-08
  Administered 2016-10-07: 10 mg via INTRAVENOUS
  Filled 2016-10-07: qty 1

## 2016-10-07 MED ORDER — DOLUTEGRAVIR SODIUM 50 MG PO TABS
50.0000 mg | ORAL_TABLET | Freq: Every day | ORAL | Status: DC
  Administered 2016-10-07 – 2016-10-08 (×2): 50 mg via ORAL
  Filled 2016-10-07 (×2): qty 1

## 2016-10-07 MED ORDER — CYCLOBENZAPRINE HCL 10 MG PO TABS: 10.0000 mg | ORAL_TABLET | Freq: Three times a day (TID) | ORAL | Status: DC | PRN

## 2016-10-07 MED ORDER — HYDROCODONE-ACETAMINOPHEN 5-325 MG PO TABS
ORAL_TABLET | ORAL | Status: AC
  Administered 2016-10-07: 2 via ORAL
  Filled 2016-10-07: qty 2

## 2016-10-07 MED ORDER — CLONIDINE HCL 0.3 MG/24HR TD PTWK
0.3000 mg | MEDICATED_PATCH | TRANSDERMAL | Status: DC
  Administered 2016-10-07: 0.3 mg via TRANSDERMAL
  Filled 2016-10-07: qty 1

## 2016-10-07 MED ORDER — CYCLOBENZAPRINE HCL 10 MG PO TABS
10.0000 mg | ORAL_TABLET | Freq: Once | ORAL | Status: AC
  Administered 2016-10-07: 10 mg via ORAL
  Filled 2016-10-07: qty 1

## 2016-10-07 MED ORDER — DICLOFENAC SODIUM 1 % TD GEL
2.0000 g | Freq: Four times a day (QID) | TRANSDERMAL | Status: DC
  Administered 2016-10-07 – 2016-10-08 (×4): 2 g via TOPICAL
  Filled 2016-10-07: qty 100

## 2016-10-07 MED ORDER — DARBEPOETIN ALFA 200 MCG/0.4ML IJ SOSY
PREFILLED_SYRINGE | INTRAMUSCULAR | Status: AC
  Administered 2016-10-07: 200 ug via INTRAVENOUS
  Filled 2016-10-07: qty 0.4

## 2016-10-07 MED ORDER — EMTRICITABINE-TENOFOVIR AF 200-25 MG PO TABS
1.0000 | ORAL_TABLET | Freq: Every day | ORAL | Status: DC
  Administered 2016-10-07 – 2016-10-08 (×2): 1 via ORAL
  Filled 2016-10-07 (×2): qty 1

## 2016-10-07 NOTE — Progress Notes (Signed)
Pt is out of the Unit, in HD. RN is unable to give him his evening meds this time, will handoff to the next shift.

## 2016-10-07 NOTE — Progress Notes (Signed)
Patient arrived to unit per bed.  Reviewed treatment plan and this RN agrees.  Report received from bedside RN, Palma Holter.  Consent verified.  Patient A & O X 4. Lung sounds diminished to ausculation in all fields. No edema. Cardiac: ST.  Prepped LLAVF with alcohol and cannulated with two 15 gauge needles.  Pulsation of blood noted.  Flushed access well with saline per protocol.  Connected and secured lines and initiated tx at 1607.  UF goal of 3000 mL and net fluid removal of 2500 mL.  Will continue to monitor.
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
## 2016-10-07 NOTE — Progress Notes (Signed)
PT Cancellation Note  Patient Details Name: Jim Wyatt MRN: [id number] DOB: Nov 18, 1980   Cancelled Treatment:    Reason Eval/Treat Not Completed: Patient at procedure or test/unavailable. Pt off of the floor for an X-ray. PT will continue to f/u with pt as available.    [place] 10/07/2016, 9:22 AM

## 2016-10-07 NOTE — Progress Notes (Signed)
[facility] Progress Note  Subjective: L neck hurts "since I came to the hospital".  SOB better, 2.2 L off w HD yest.  BP's still up.  Doesn't have clon patch on here, was on it at home.   Vitals:   10/06/16 2146 10/07/16 0018 10/07/16 0402 10/07/16 0813  BP: (!) 179/118 (!) 179/112 (!) 175/113 (!) 180/126  Pulse: (!) 112 (!) 111 (!) 113   Resp: (!) 22 20 20    Temp: 98.2 F (36.8 C) 98.9 F (37.2 C) 98.6 F (37 C)   TempSrc: Oral Oral Oral   SpO2: 98% 95% 100%   Weight:   65.9 kg (145 lb 3.2 oz)   Height:        Inpatient medications: . cloNIDine  0.3 mg Transdermal Q Sun  . dapsone  100 mg Oral Daily  . darbepoetin (ARANESP) injection - DIALYSIS  200 mcg Intravenous Q Sat-HD  . dolutegravir  50 mg Oral Q1200  . emtricitabine-tenofovir AF  1 tablet Oral Q1200  . gabapentin  100 mg Oral Daily  . heparin subcutaneous  5,000 Units Subcutaneous Q8H  . hydrALAZINE  100 mg Oral TID  . isosorbide mononitrate  60 mg Oral Daily  . levETIRAcetam  250 mg Oral Q T,Th,Sat-1800  . levETIRAcetam  500 mg Oral Daily  . losartan  100 mg Oral Daily  . topiramate  50 mg Oral BID   . ferric gluconate (FERRLECIT/NULECIT) IV     acetaminophen **OR** acetaminophen, albuterol, famotidine, hydrALAZINE, HYDROcodone-acetaminophen, hydrOXYzine, ondansetron **OR** ondansetron (ZOFRAN) IV  Exam: General: alert thin chronically ill AAM. NAD   HEENT: [place] , MMM, N[place]ter[place] Neck: pos jvd Heart: Reg Tachy  1/6 sem , no rub / gallop  Lungs:  bilat basilar crackles , non labored breathing currently  Abdomen: bs pos ,soft ,ND Extremities: no pedal edema  Skin: no overt rash, warm dry Neuro: alert , No acute focal deficits   appreciated  Dialysis Access: R FA AVF   Dialysis: [facility]  On TueThuSat, 3 hrs 45 min, EDW 65 (kg), Dialysate 2.0 K, 2.0 Ca, R FA AV Fistula- .Heparin none     Venofer 100mg  q tx last on 10/19/16    Mircera 200 q 2 wks (last on 09/14/16)   Assessment/Plan 1. Volume  overload- recurrent issue. Better today after HD. May have lost some body wt, will challenge on HD today, possibly lower edw.   2. ESRD -  HD  Normal  TTS  HD = needs HD today 2/2 volume overloaded/ Hd in am to keep on schedule  3. HTN urgency= hd and meds. Have resumed catapres patch to start now. Cont losartan and hydralazine as well.  He is very reluctant to try and new other antihypertensives , says he has side effects to them.   4.  HIV/ AIDS- meds per admit 5. Anemia  - hgb 10.2  Iron load continue  ESA   Tomorrow  6. Metabolic bone disease -  On Renvela powder listed as binder from [facility], needs Vit D  With PTH [phone]  / CA 9.9  Restart Hectorol 5 mcg  iv q HD (held as op 2/2 ^ ca) fu labs pre hd in am   7. History of seizures,- on Keppra, he is compliant to the med Continue [place] - HD again today to keep on sched   Kelly Splinter MD [employer] pager [phone]   10/07/2016, 9:20 AM    Recent  Labs Lab 10/06/16 1119 10/07/16 0202  NA 135 134*  K 3.7 3.6  CL 93* 94*  CO2 29 28  GLUCOSE 96 95  BUN 46* 19  CREATININE 9.89* 6.01*  CALCIUM 9.9 9.5    Recent Labs Lab 10/07/16 0202  AST 17  ALT 7*  ALKPHOS 69  BILITOT 1.4*  PROT 8.3*  ALBUMIN 3.2*    Recent Labs Lab 10/06/16 1119 10/07/16 0202  WBC 7.1 7.6  HGB 10.2* 10.2*  HCT 32.1* 31.1*  MCV 94.7 94.0  PLT 178 189   Iron/TIBC/Ferritin/ %Sat    Component Value Date/Time   IRON 59 01/12/2015 1617   TIBC 248 (L) 01/12/2015 1617   FERRITIN 1,050 (H) 02/01/2016 1836   IRONPCTSAT 24 01/12/2015 1617

## 2016-10-07 NOTE — Progress Notes (Signed)
Pt's recent BP is 128/64, double check with dialysis nurse and hold the hydralazine since pt is going to have HD anytime now  Voltaren Gel applied to pt's neck, pt felt some relief  12 lead EKG done and MD informed  Will continue to monitor

## 2016-10-07 NOTE — Progress Notes (Signed)
Patient back from MRI, report received from HD nurse- 1L pulled of, didn't reach goal of 2L d/t cramping. Time was 3hr 26min.
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
## 2016-10-07 NOTE — Progress Notes (Signed)
IV hydralazine 10mg  given per having BP 180/126, will recheck BP shortly again to reassess

## 2016-10-07 NOTE — Progress Notes (Signed)
Patient wants neck xray done due to neck pain.

## 2016-10-07 NOTE — Progress Notes (Signed)
Patient says he didn't take his antivirals today, RN called pharmacy and dose for tomorrow will be rescheduled for later so that patient can take doses tonight. Apparently patient told pharmacy earlier that he took all of his meds.

## 2016-10-07 NOTE — Progress Notes (Signed)
Dialysis treatment completed.  1650 mL ultrafiltrated and net fluid removal 1000 mL.  Unable to achieve 2L prescribed goal d/t cramping.  Patient status unchanged. Lung sounds diminished to ausculation in all fields. No edema. Cardiac: ST.  Disconnected lines and removed needles.  Pressure held for 10 minutes and band aid/gauze dressing applied.  Report given to bedside RN, Burman Riis.

## 2016-10-08 DIAGNOSIS — I5031 Acute diastolic (congestive) heart failure: Secondary | ICD-10-CM

## 2016-10-08 DIAGNOSIS — M542 Cervicalgia: Secondary | ICD-10-CM

## 2016-10-08 MED ORDER — FAMOTIDINE 20 MG PO TABS: 20.0000 mg | ORAL_TABLET | Freq: Two times a day (BID) | ORAL | Status: DC | PRN

## 2016-10-08 MED ORDER — CYCLOBENZAPRINE HCL 10 MG PO TABS: 10.0000 mg | ORAL_TABLET | Freq: Three times a day (TID) | ORAL | 0 refills | Status: DC | PRN

## 2016-10-08 MED ORDER — TRIAMCINOLONE ACETONIDE 0.5 % EX OINT: 1.0000 "application " | TOPICAL_OINTMENT | Freq: Two times a day (BID) | CUTANEOUS | Status: DC | PRN

## 2016-10-08 MED ORDER — DICLOFENAC SODIUM 1 % TD GEL: 2.0000 g | Freq: Four times a day (QID) | TRANSDERMAL | 0 refills | Status: DC | PRN

## 2016-10-08 MED ORDER — GABAPENTIN 100 MG PO CAPS: 100.0000 mg | ORAL_CAPSULE | Freq: Every day | ORAL | Status: DC

## 2016-10-08 NOTE — Discharge Summary (Addendum)
Pt got discharged to home, discharge instructions provided and patient showed understanding to it, IV taken out,Telemonitor DC,pt left unit in wheelchair with all of the belongings.pt's  blood pressure stable during discharge.

## 2016-10-08 NOTE — Discharge Instructions (Signed)
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
## 2016-10-08 NOTE — Progress Notes (Signed)
Pt's recent BP is 95/36, but pt is symptomatic, double checked with MD regarding pt's DC order, MD said it is ok to discharge as long as pt is asymptomatic, But RN will still check pt's BP before he leave.
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
## 2016-10-08 NOTE — Progress Notes (Signed)
PT Cancellation Note  Patient Details Name: Jim Wyatt MRN: [id number] DOB: [birth year]   Cancelled Treatment:    Reason Eval/Treat Not Completed: Other (comment) pt is asleep when PT arrives and easily aroused, but easily falling back to sleep. PT will check back as time allows when pt is more able to participate.    Scheryl Marten PT, DPT  [phone]  10/08/2016, 11:37 AM

## 2016-10-13 ENCOUNTER — Other Ambulatory Visit: Payer: Self-pay | Admitting: Internal Medicine

## 2016-10-13 ENCOUNTER — Telehealth: Payer: Self-pay | Admitting: Internal Medicine

## 2016-10-13 NOTE — Telephone Encounter (Signed)
MA spoke with patients mother regarding her FMLA paperwork. MA contacted Kara Mead at [phone] and verified our office name and fax number. She will refax the paperwork from [facility] to my attention. Patient will be contacted once received and faxed. Patient expressed her appreciation and had no further concerns.

## 2016-10-18 ENCOUNTER — Other Ambulatory Visit: Payer: Self-pay | Admitting: Pharmacist Clinician (PhC)/ Clinical Pharmacy Specialist

## 2016-10-18 ENCOUNTER — Ambulatory Visit: Payer: Self-pay | Admitting: Internal Medicine

## 2016-10-18 ENCOUNTER — Other Ambulatory Visit: Payer: Self-pay | Admitting: Pharmacist

## 2016-10-18 ENCOUNTER — Other Ambulatory Visit: Payer: Self-pay | Admitting: *Deleted

## 2016-10-18 MED ORDER — BICTEGRAVIR-EMTRICITAB-TENOFOV 50-200-25 MG PO TABS: 1.0000 | ORAL_TABLET | Freq: Every day | ORAL | 5 refills | Status: DC

## 2016-10-18 MED ORDER — BICTEGRAVIR-EMTRICITAB-TENOFOV 50-200-25 MG PO TABS: 1.0000 | ORAL_TABLET | Freq: Every day | ORAL | 3 refills | Status: DC

## 2016-10-18 MED FILL — BIKTARVY 50-200-25 MG TABS: 50-200-25 | 30 days supply | Qty: 30 | Fill #0

## 2016-10-18 NOTE — Progress Notes (Signed)
Javarie has lost insurance with [employer] now, therefore, Juliann Pulse will do his ADAP until he can get his insurance again. We will change his therapy to [facility] for now also because bictegravir is not renally cleared. This way we just have to go through 1 company instead of 2. He will pick up the [place] at [facility] today to continue his therapy. She will also submit an app for him also just in case he needs it longer.

## 2016-10-19 ENCOUNTER — Encounter: Payer: Self-pay | Admitting: Infectious Disease

## 2016-11-01 ENCOUNTER — Ambulatory Visit: Payer: Self-pay | Attending: Internal Medicine | Admitting: Internal Medicine

## 2016-11-01 ENCOUNTER — Encounter: Payer: Self-pay | Admitting: Internal Medicine

## 2016-11-01 VITALS — BP 155/84 | HR 100 | Temp 98.4°F | Resp 16 | Wt 154.8 lb

## 2016-11-01 DIAGNOSIS — Z09 Encounter for follow-up examination after completed treatment for conditions other than malignant neoplasm: Secondary | ICD-10-CM

## 2016-11-01 DIAGNOSIS — I132 Hypertensive heart and chronic kidney disease with heart failure and with stage 5 chronic kidney disease, or end stage renal disease: Secondary | ICD-10-CM | POA: Insufficient documentation

## 2016-11-01 DIAGNOSIS — Z23 Encounter for immunization: Secondary | ICD-10-CM

## 2016-11-01 DIAGNOSIS — Z992 Dependence on renal dialysis: Secondary | ICD-10-CM

## 2016-11-01 DIAGNOSIS — G40909 Epilepsy, unspecified, not intractable, without status epilepticus: Secondary | ICD-10-CM | POA: Insufficient documentation

## 2016-11-01 DIAGNOSIS — J45909 Unspecified asthma, uncomplicated: Secondary | ICD-10-CM | POA: Insufficient documentation

## 2016-11-01 DIAGNOSIS — Z882 Allergy status to sulfonamides status: Secondary | ICD-10-CM | POA: Insufficient documentation

## 2016-11-01 DIAGNOSIS — Z881 Allergy status to other antibiotic agents status: Secondary | ICD-10-CM | POA: Insufficient documentation

## 2016-11-01 DIAGNOSIS — Z79899 Other long term (current) drug therapy: Secondary | ICD-10-CM | POA: Insufficient documentation

## 2016-11-01 DIAGNOSIS — N186 End stage renal disease: Secondary | ICD-10-CM

## 2016-11-01 DIAGNOSIS — B20 Human immunodeficiency virus [HIV] disease: Secondary | ICD-10-CM

## 2016-11-01 DIAGNOSIS — Z880 Allergy status to penicillin: Secondary | ICD-10-CM | POA: Insufficient documentation

## 2016-11-01 NOTE — Patient Instructions (Signed)
Dialysis Diet Dialysis is a treatment that cleans your blood. It is used when the kidneys are damaged. When you need dialysis, you should watch your diet. This is because some nutrients can build up in your blood between treatments and make you sick. These nutrients are:  Potassium.  Phosphorus.  Sodium.  Your doctor or dietitian will:  Tell you how much of these you can have.  Tell you if you need to look out for other nutrients too.  Help you plan meals.  Tell you how much to drink each day.  What do I need to know about this diet?  Limit potassium. Potassium is in milk, fruits, and vegetables.  Limit phosphorus. Phosphorus is in milk, cheese, beans, nuts, and carbonated beverages.  Limit salt (sodium). Foods that have a lot sodium include processed and cured meats, ready-made frozen meals, canned vegetables, and salty snack foods.  Do not use salt substitutes.  Try not to eat whole-grain foods and foods that have a lot of fiber.  Follow your doctor's instructions about how much to drink. You may be told to: ? Write down what you drink. ? Write down foods you eat that are made mostly from water, such as gelatin and soups. ? Drink from small cups.  Ask your doctor if you should take a medicine that binds phosphorus.  Take vitamin and mineral supplements only as told by your doctor.  Eat meat, poultry, fish, and eggs. Limit nuts and beans.  Before you cook potatoes, cut them into small pieces. Then boil them in unsalted water.  Drain all fluid from cooked vegetables and canned fruits before you eat them. What foods can I eat? Grains White bread. White rice. Cooked cereal. Unsalted popcorn. Tortillas. Pasta. Vegetables Fresh or frozen broccoli, carrots, and green beans. Cabbage. Cauliflower. Celery. Cucumbers. Eggplant. Radishes. Zucchini. Fruits Apples. Fresh or frozen berries. Fresh or canned pears, peaches, and pineapple. Grapes. Plums. Meats and Other Protein  Sources Fresh or frozen beef, pork, chicken, and fish. Eggs. Low-sodium canned tuna or salmon. Dairy Cream cheese. Heavy cream. Ricotta cheese. Beverages Apple cider. Cranberry juice. Grape juice. Lemonade. Black coffee. Condiments Herbs. Spices. Jam and jelly. Honey. Sweets and Desserts Sherbet. Cakes. Cookies. Fats and Oils Olive oil, canola oil, and safflower oil. Other Non-dairy creamer. Non-dairy whipped topping. Homemade broth without salt. The items listed above may not be a complete list of recommended foods or beverages. Contact your dietitian for more options. What foods are not recommended? Grains Whole-grain bread. Whole-grain pasta. High-fiber cereal. Vegetables Potatoes. Beets. Tomatoes. Winter squash and pumpkin. Asparagus. Spinach. Parsnips. Fruits Star fruit. Bananas. Oranges. Kiwi. Nectarines. Prunes. Melon. Dried fruit. Avocado. Meats and Other Protein Sources Canned, smoked, and cured meats. Soil scientist. Sardines. Nuts and seeds. Peanut butter. Beans and legumes. Dairy Milk. Buttermilk. Yogurt. Cheese and cottage cheese. Processed cheese spreads. Beverages Orange juice. Prune juice. Carbonated soft drinks. Condiments Salt. Salt substitutes. Soy sauce. Sweets and Desserts Ice cream. Chocolate. Candied nuts. Fats and Oils Butter. Margarine. Other Ready-made frozen meals. Canned soups. The items listed above may not be a complete list of foods and beverages to avoid. Contact your dietitian for more information. This information is not intended to replace advice given to you by your health care provider. Make sure you discuss any questions you have with your health care provider. Document Released: 08/15/2011 Document Revised: 07/22/2015 Document Reviewed: 09/16/2013 Elsevier Interactive Patient Education  2018 [employer] Kidney Disease End-stage kidney disease occurs when the kidneys  are so damaged that they cannot do their job. The  kidneys are two organs that do many important jobs in the body, which include:  Removing wastes and extra fluids from the blood.  Making hormones that maintain the amount of fluid in your tissues and blood vessels.  Maintaining the right amount of fluids and chemicals in the body.  When the kidneys are damaged and cannot do their job, life-threatening problems occur. Without the help of the kidneys, toxins build up in the blood. In end-stage kidney disease, the kidneys cannot get better. What are the causes? End-stage kidney disease usually occurs when a long-lasting (chronic) kidney disease gets worse. It may also occur after the kidneys are suddenly damaged (acute kidney injury). What increases the risk? This condition is more likely to develop in people who are:  Older than age 24.  Male.  Of African-American descent.  Current smokers or former smokers.  Obese.  You may also have an increased risk for end-stage kidney disease if you:  Have a family history of chronic kidney disease (CKD).  Have had kidney disease for many years.  Have other longstanding medical conditions that affect the kidneys, such as: ? Cardiovascular disease including high blood pressure. ? Diabetes. ? Certain diseases that affect the immune system.  What are the signs or symptoms?  Swelling (edema) of the face, legs, ankles, or feet.  Numbness, tingling, or loss of feeling (sensation) in your hands or feet.  Tiredness (lethargy).  Nausea or vomiting.  Confusion, trouble concentrating, or loss of consciousness.  Chest pain.  Shortness of breath.  Little to no urine production.  Muscle twitches and cramps, especially in the legs.  Constant itchiness.  Loss of appetite.  Pale skin and tissue lining your eyelids (conjunctiva).  Headaches.  Abnormally dark or light skin.  Decrease in muscle size (muscle wasting).  Easy bruising.  Frequent hiccups.  Stopping of menstruation in  women.  Seizures. How is this diagnosed? Your health care provider will measure your blood pressure and do some tests. These may include:  Urine tests.  Blood tests.  Imaging tests.  A test in which a sample of tissue is removed from the kidneys to be looked at under a microscope (kidney biopsy).  How is this treated? There are two treatments for end-stage kidney disease:  A procedure that removes toxic wastes from the body (dialysis). Depending on the type of dialysis you choose, it may be performed more than one time a day (peritoneal dialysis) or several times a week (hemodialysis).  Surgery toreceive a new kidney (kidney transplant).  In addition to having dialysis or a kidney transplant, you may need to take medicines:  To control high blood pressure (hypertension).  To control cholesterol.  To maintain healthy electrolyte levels in your blood.  You may also be given a specific diet to follow that includes requirements or limits for:  Salt (sodium).  Protein.  Phosphorous.  Potassium.  Calcium.  Follow these instructions at home:  Follow your prescribed diet.  Take over-the-counter and prescription medicines only as told by your health care provider. ? Do not take any new medicines unless approved by your health care provider. Many medicines can worsen your kidney damage. ? Do not take any vitamin and mineral supplements unless approved by your health care provider. Many nutritional supplements can worsen your kidney damage. ? The dose of some medicines that you take may need to be adjusted.  Do not use any tobacco products, such  as cigarettes, chewing tobacco, and e-cigarettes. If you need help quitting, ask your health care provider.  Keep all follow-up visits as told by your health care provider. This is important.  Keep track of your blood pressure. Report changes in your blood pressure as told by your health care provider.  Achieve and maintain a  healthy weight. If you need help with this, ask your health care provider.  Start or continue an exercise plan. Try to exercise at least 30 minutes a day, 5 days a week.  Stay current with immunizations as told by your health care provider. Where to find more information:  American Association of Kidney Patients: BombTimer.gl  National Kidney Foundation: www.kidney.[place]: https://mathis.com/  Life Options Rehabilitation Program: www.lifeoptions.org and www.kidneyschool.org Contact a health care provider if:  Your symptoms get worse.  You develop new symptoms. Get help right away if:  You have weakness in an arm or leg on one side of your body.  You have difficulty speaking or you are slurring your speech.  You have a sudden change in your vision.  You have a sudden, severe headache.  You have a sudden weight increase.  You have difficulty breathing.  Your symptoms suddenly get worse. This information is not intended to replace advice given to you by your health care provider. Make sure you discuss any questions you have with your health care provider. Document Released: 05/06/2003 Document Revised: 07/22/2015 Document Reviewed: 10/13/2011 Elsevier Interactive Patient Education  2017 [employer].

## 2016-11-01 NOTE — Progress Notes (Signed)
Jim Wyatt, is a 36 y.o. male  ZOX:[id number]  [id number]  DOB - April 28, 1980  Chief Complaint  Patient presents with  . Chronic Kidney Disease      Subjective:   Jim Wyatt is a 36 y.o. 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 He was recently admitted to the hospital for volume overload and acute on chronic diastolic heart failure precipitated by hypertensive urgency. There was a question of adherence with medications and HD follow up. He has no new complaint today, he said he feels fine, already returned to work. He is very anxious to be evaluated for kidney transplantation, expectant. Patient said he is adherent with medications since leaving hospital. His BP is not as high today. Patient has No headache, No chest pain, No abdominal pain - No Nausea, No new weakness tingling or numbness, No Cough - SOB.  ALLERGIES: Allergies  Allergen Reactions  . Bactrim [Sulfamethoxazole-Trimethoprim] Other (See Comments)    Renal failure  . Onion Hives and Other (See Comments)    Per allergy test  . Procardia [Nifedipine] Hives, Shortness Of Breath and Itching  . Reglan [Metoclopramide] Other (See Comments)    Causes ticks/ jerks  . Vancomycin Other (See Comments)    Patient states vancomycin caused kidney injury  . Metoprolol Hives  . Amlodipine Palpitations and Other (See Comments)    Causes fatigue  . Augmentin [Amoxicillin-Pot Clavulanate] Hives   PAST MEDICAL HISTORY: Past Medical History:  Diagnosis Date  . Asthma   . CAP (community acquired pneumonia) 10/2013  . Cellulitis 11/2013   LLE  . DVT (deep venous thrombosis) ([place]) 10/2013   BLE  . 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 Renal Bx (11/17/13)  with collapsing FSGS (HIVAN) + ATN (suspected vanc toxic at time of biopsy).  Went on HD Dec 2016.   [name] Fever   . Headache   . HIV infection ([place]) dx'd 10/2013  . Hypertension   . Leg pain   . Multiple environmental allergies    "trees, dogs, cats"  . Multiple food allergies   . Seizures ([place])    last seizure per pt in Jan  . Shingles < 2010   MEDICATIONS AT HOME: Prior to Admission medications   Medication Sig Start Date End Date Taking? Authorizing Provider  acetaminophen (TYLENOL) 500 MG tablet Take 1,000 mg by mouth every 6 (six) hours as needed (pain).    [provider]  albuterol (PROVENTIL HFA;VENTOLIN HFA) 108 (90 Base) MCG/ACT inhaler Inhale 2 puffs into the lungs every 6 (six) hours as needed for wheezing or shortness of breath. 06/22/16   Smiley Houseman, MD  bictegravir-emtricitabine-tenofovir AF (BIKTARVY) 50-200-25 MG TABS tablet Take 1 tablet by mouth daily. 10/18/16   Carlyle Basques, MD  cloNIDine (CATAPRES - DOSED IN MG/24 HR) 0.3 mg/24hr patch Place 1 patch (0.3 mg total) onto the skin every Sunday. 10/01/16   Waldron Session, MD  cyclobenzaprine (FLEXERIL) 10 MG tablet Take 1 tablet (10 mg total) by mouth 3 (three) times daily as needed for muscle spasms. 10/08/16   Hongalgi, Lenis Dickinson, MD  dapsone 100 MG tablet Take 1 tablet (100 mg total) by mouth daily. 07/28/16   Truman Hayward, MD  diclofenac sodium (  VOLTAREN) 1 % GEL Apply 2 g topically 4 (four) times daily as needed. For painful area on left side of back of neck. 10/08/16   Hongalgi, Lenis Dickinson, MD  famotidine (PEPCID) 20 MG tablet Take 1 tablet (20 mg total) by mouth 2 (two) times daily as needed for heartburn (acid reflux). 10/08/16   Hongalgi, Lenis Dickinson, MD  gabapentin (NEURONTIN) 100 MG capsule Take 1 capsule (100 mg total) by mouth daily. 10/08/16   Hongalgi, Lenis Dickinson, MD  hydrALAZINE (APRESOLINE) 100 MG tablet Take 100-200 mg by mouth See admin instructions. Take 1 tablet (100 mg) by mouth three times daily,  take 2 tablets (200 mg) for SBP >190    [provider]  hydrOXYzine (ATARAX/VISTARIL) 25 MG tablet Take 1 tablet (25 mg total) by mouth 3 (three) times daily as needed for itching. Patient taking differently: Take 25 mg by mouth 2 (two) times daily.  07/05/16   [name], MD  isosorbide mononitrate (IMDUR) 60 MG 24 hr tablet Take 1 tablet (60 mg total) by mouth daily. 08/23/16   Valinda Party, DO  levETIRAcetam (KEPPRA) 250 MG tablet Take 1 tablet (250 mg) by mouth on Tuesday, Thursday, Saturday at 7pm Patient taking differently: Take 250 mg by mouth See admin instructions. Take one tablet (250 mg) by mouth every Tuesday, Thursday and Saturday at 7 pm 03/13/16   Cameron Sprang, MD  levETIRAcetam (KEPPRA) 500 MG tablet Take 500 mg by mouth daily.    [provider]  losartan (COZAAR) 100 MG tablet Take 100 mg by mouth daily.     [provider]  topiramate (TOPAMAX) 50 MG tablet Take 1 tablet (50 mg total) by mouth 2 (two) times daily. 06/22/16   Smiley Houseman, MD  triamcinolone ointment (KENALOG) 0.5 % Apply 1 application topically 2 (two) times daily as needed (rash/ itching). 10/08/16   Modena Jansky, MD    Objective:   Vitals:   11/01/16 1054  BP: (!) 155/84  Pulse: 100  Resp: 16  Temp: 98.4 F (36.9 C)  TempSrc: Oral  SpO2: 95%  Weight: 154 lb 12.8 oz (70.2 kg)   Exam General appearance : Awake, alert, not in any distress. Speech Clear. Not toxic looking HEENT: Atraumatic and Normocephalic, pupils equally reactive to light and accomodation Neck: Supple, no JVD. No cervical lymphadenopathy.  Chest: Good air entry bilaterally, no added sounds  CVS: S1 S2 regular, no murmurs.  Abdomen: Bowel sounds present, Non tender and not distended with no gaurding, rigidity or rebound. Extremities: B/L Lower Ext shows no edema, both legs are warm to touch Neurology: Awake alert, and oriented X 3, CN II-XII intact, Non focal Skin: No  Rash  Data Review Lab Results  Component Value Date   HGBA1C <4.2 (L) 05/12/2016   Assessment & Plan   1. Hospital discharge follow-up  - Patient is stable - Continue current medications  2. ESRD on dialysis [facility])  - Continue to follow up with HD  3. Human immunodeficiency virus (HIV) disease ([place])  - Continue follow up with [facility] - Continue current medications   Patient have been counseled extensively about nutrition and exercise. Other issues discussed during this visit include: low cholesterol diet, weight control and daily exercise  Return in about 3 months (around 01/31/2017) for CKD/ESRD, Follow up HTN.  The patient was given clear instructions to go to ER or return to medical center if symptoms don't improve, worsen or new problems develop. The patient  verbalized understanding. The patient was told to call to get lab results if they haven't heard anything in the next week.   This note has been created with Surveyor, quantity. Any transcriptional errors are unintentional.    [name], MD, [place], [place], [place], [place] and Rodney [place], [place]   11/01/2016, 11:22 AM

## 2016-12-12 LAB — REFLEX TO GENOSURE(R) MG

## 2016-12-18 ENCOUNTER — Encounter: Payer: Self-pay | Admitting: Infectious Disease

## 2016-12-18 ENCOUNTER — Ambulatory Visit (INDEPENDENT_AMBULATORY_CARE_PROVIDER_SITE_OTHER): Payer: Self-pay | Admitting: Infectious Disease

## 2016-12-18 VITALS — BP 197/116 | HR 112 | Temp 98.1°F | Ht 67.0 in | Wt 159.0 lb

## 2016-12-18 DIAGNOSIS — B20 Human immunodeficiency virus [HIV] disease: Secondary | ICD-10-CM | POA: Diagnosis not present

## 2016-12-18 DIAGNOSIS — N032 Chronic nephritic syndrome with diffuse membranous glomerulonephritis: Secondary | ICD-10-CM

## 2016-12-18 DIAGNOSIS — Z992 Dependence on renal dialysis: Secondary | ICD-10-CM

## 2016-12-18 DIAGNOSIS — N08 Glomerular disorders in diseases classified elsewhere: Secondary | ICD-10-CM

## 2016-12-18 DIAGNOSIS — I10 Essential (primary) hypertension: Secondary | ICD-10-CM

## 2016-12-18 DIAGNOSIS — N186 End stage renal disease: Secondary | ICD-10-CM

## 2016-12-18 MED ORDER — BICTEGRAVIR-EMTRICITAB-TENOFOV 50-200-25 MG PO TABS: 1.0000 | ORAL_TABLET | Freq: Every day | ORAL | 11 refills | Status: DC

## 2016-12-18 MED ORDER — DAPSONE 100 MG PO TABS: 100.0000 mg | ORAL_TABLET | Freq: Every day | ORAL | 11 refills | Status: DC

## 2016-12-18 NOTE — Progress Notes (Signed)
Subjective:    Chief complaint: here for followup for HIV now without meds since Thursday   Patient ID: Jim Wyatt, male    DOB: 1980/12/04, 36 y.o.   MRN: [id number]  HPI  36 y.o. 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  He has been treated with > 21 days of clinda, primaquin and prednisone (was still taking prednisone and clindamycin with him).  We simplified his prior regimen to Tiv[place]ay and Descovy and he dropped his VL nicely from over 150K to 149 copies and downwards and more recently to 25 in July. We have swtiched him to [facility]. 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 He has now purchased separate insurance which he believes will work for meds now.   Lab Results  Component Value Date   HIV1RNAQUANT 25 (H) 09/04/2016   HIV1RNAQUANT 79 (H) 08/14/2016   HIV1RNAQUANT 42 (H) 07/28/2016   Lab Results  Component Value Date   CD4TABS 140 (L) 09/04/2016   CD4TABS 90 (L) 08/14/2016   CD4TABS 70 (L) 07/28/2016   His BP is much more POORLY controlled but that is because he is out of MOST of his BP meds as well and just coming to fill them.    Past Medical History:  Diagnosis Date  . Asthma   . CAP (community acquired pneumonia) 10/2013  . Cellulitis 11/2013   LLE  . DVT (deep venous thrombosis) ([place]) 10/2013   BLE  . 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  Went on HD Dec 2016.   [name] Fever   . Headache   . HIV infection ([place]) dx'd 10/2013  . Hypertension   . Leg pain   . Multiple environmental allergies    "trees, dogs, cats"  . Multiple food allergies   . Seizures ([place])    last seizure per pt in Jan  . Shingles < 2010    Past Surgical History:  Procedure Laterality  Date  . AV FISTULA PLACEMENT Left 03/03/2015   Procedure: Creation of Left Arm RADIOCEPHALIC ARTERIOVENOUS FISTULA ;  Surgeon: Mal Misty, MD;  Location: [facility];  Service: Vascular;  Laterality: Left;  . COLONOSCOPY N/A 03/18/2016   Procedure: COLONOSCOPY;  Surgeon: Manus Gunning, MD;  Location: [place];  Service: Gastroenterology;  Laterality: N/A;  . FEMUR FRACTURE SURGERY Right 09/2001  . FRACTURE SURGERY    . I&D EXTREMITY Right 12/18/2014   Procedure: IRRIGATION AND DEBRIDEMENT RIGHT LOWER EXTREMITY;  Surgeon: Ralene Ok, MD;  Location: [facility];  Service: General;  Laterality: Right;  . INSERTION OF DIALYSIS CATHETER  2016  . LAPAROTOMY N/A 12/28/2015   Procedure: EXPLORATORY LAPAROTOMY BOWEL RESECTION ILEOCECECTOMY;  Surgeon: Clovis Riley, MD;  Location: [facility];  Service: General;  Laterality: N/A;    Family History  Problem Relation Age of Onset  . Hypertension Mother   . Diabetes Maternal Grandmother   . Kidney disease Maternal Grandmother       Social History   Social History  . Marital status: Single    Spouse name: N/A  . Number of children: N/A  . Years of education: N/A   Social History Main Topics  . Smoking status: Never  Smoker  . Smokeless tobacco: Never Used  . Alcohol use No     Comment: I stopped drinking along time ago "  . Drug use: No  . Sexual activity: Not Currently    Partners: Female    Birth control/ protection: Condom   Other Topics Concern  . Not on file   Social History Narrative  . No narrative on file    Allergies  Allergen Reactions  . Bactrim [Sulfamethoxazole-Trimethoprim] Other (See Comments)    Renal failure  . Onion Hives and Other (See Comments)    Per allergy test  . Procardia [Nifedipine] Hives, Shortness Of Breath and Itching  . Reglan [Metoclopramide] Other (See Comments)    Causes ticks/ jerks  . Vancomycin Other (See Comments)    Patient states vancomycin caused kidney injury  . Metoprolol Hives    . Amlodipine Palpitations and Other (See Comments)    Causes fatigue  . Augmentin [Amoxicillin-Pot Clavulanate] Hives     Current Outpatient Prescriptions:  .  acetaminophen (TYLENOL) 500 MG tablet, Take 1,000 mg by mouth every 6 (six) hours as needed (pain)., Disp: , Rfl:  .  albuterol (PROVENTIL HFA;VENTOLIN HFA) 108 (90 Base) MCG/ACT inhaler, Inhale 2 puffs into the lungs every 6 (six) hours as needed for wheezing or shortness of breath., Disp: 1 Inhaler, Rfl: 0 .  bictegravir-emtricitabine-tenofovir AF (BIKTARVY) 50-200-25 MG TABS tablet, Take 1 tablet by mouth daily., Disp: 30 tablet, Rfl: 5 .  cloNIDine (CATAPRES - DOSED IN MG/24 HR) 0.3 mg/24hr patch, Place 1 patch (0.3 mg total) onto the skin every Sunday., Disp: 4 patch, Rfl: 12 .  cyclobenzaprine (FLEXERIL) 10 MG tablet, Take 1 tablet (10 mg total) by mouth 3 (three) times daily as needed for muscle spasms., Disp: 10 tablet, Rfl: 0 .  dapsone 100 MG tablet, Take 1 tablet (100 mg total) by mouth daily., Disp: 30 tablet, Rfl: 11 .  diclofenac sodium (VOLTAREN) 1 % GEL, Apply 2 g topically 4 (four) times daily as needed. For painful area on left side of back of neck., Disp: 100 g, Rfl: 0 .  famotidine (PEPCID) 20 MG tablet, Take 1 tablet (20 mg total) by mouth 2 (two) times daily as needed for heartburn (acid reflux)., Disp: , Rfl:  .  gabapentin (NEURONTIN) 100 MG capsule, Take 1 capsule (100 mg total) by mouth daily., Disp: , Rfl:  .  hydrALAZINE (APRESOLINE) 100 MG tablet, Take 100-200 mg by mouth See admin instructions. Take 1 tablet (100 mg) by mouth three times daily, take 2 tablets (200 mg) for SBP >190, Disp: , Rfl:  .  hydrOXYzine (ATARAX/VISTARIL) 25 MG tablet, Take 1 tablet (25 mg total) by mouth 3 (three) times daily as needed for itching. (Patient taking differently: Take 25 mg by mouth 2 (two) times daily. ), Disp: 90 tablet, Rfl: 3 .  isosorbide mononitrate (IMDUR) 60 MG 24 hr tablet, Take 1 tablet (60 mg total) by mouth  daily., Disp: 30 tablet, Rfl: 0 .  levETIRAcetam (KEPPRA) 250 MG tablet, Take 1 tablet (250 mg) by mouth on Tuesday, Thursday, Saturday at 7pm (Patient taking differently: Take 250 mg by mouth See admin instructions. Take one tablet (250 mg) by mouth every Tuesday, Thursday and Saturday at 7 pm), Disp: 60 tablet, Rfl: 11 .  levETIRAcetam (KEPPRA) 500 MG tablet, Take 500 mg by mouth daily., Disp: , Rfl:  .  losartan (COZAAR) 100 MG tablet, Take 100 mg by mouth daily. , Disp: , Rfl:  .  topiramate (  TOPAMAX) 50 MG tablet, Take 1 tablet (50 mg total) by mouth 2 (two) times daily., Disp: 60 tablet, Rfl: 0 .  triamcinolone ointment (KENALOG) 0.5 %, Apply 1 application topically 2 (two) times daily as needed (rash/ itching)., Disp: , Rfl:    Review of Systems  Constitutional: Negative for chills and fever.  HENT: Negative for congestion and sore throat.   Eyes: Negative for photophobia.  Respiratory: Negative for cough, shortness of breath and wheezing.   Cardiovascular: Negative for chest pain, palpitations and leg swelling.  Gastrointestinal: Negative for abdominal pain, blood in stool, constipation, diarrhea, nausea and vomiting.  Genitourinary: Negative for dysuria, flank pain and hematuria.  Musculoskeletal: Negative for back pain and myalgias.  Neurological: Negative for dizziness, weakness and headaches.  Hematological: Does not bruise/bleed easily.  Psychiatric/Behavioral: Negative for suicidal ideas.       Objective:   Physical Exam  Constitutional: He is oriented to person, place, and time. No distress.  HENT:  Head: Normocephalic and atraumatic.  Mouth/Throat: No oropharyngeal exudate.  Eyes: Conjunctivae and EOM are normal. No scleral icterus.  Neck: Normal range of motion. Neck supple.  Cardiovascular: Normal rate and regular rhythm.   Pulmonary/Chest: Effort normal. No respiratory distress. He has no wheezes.  Abdominal: He exhibits no distension.  Musculoskeletal: He  exhibits no edema or tenderness.  Neurological: He is alert and oriented to person, place, and time. He exhibits normal muscle tone. Coordination normal.  Skin: Skin is warm and dry. No rash noted. He is not diaphoretic. No erythema. No pallor.  Psychiatric: He has a normal mood and affect. His behavior is normal. Judgment and thought content normal.          Assessment & Plan:   HIV/AIDS: Continue BIKTARVY and dapsone for pCP prevention. 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

## 2016-12-19 LAB — T-HELPER CELL (CD4) - (RCID CLINIC ONLY)
CD4 % Helper T Cell: 13 % — ABNORMAL LOW (ref 33–55)
CD4 T Cell Abs: 130 /uL — ABNORMAL LOW (ref 400–2700)

## 2016-12-22 ENCOUNTER — Telehealth: Payer: Self-pay | Admitting: *Deleted

## 2016-12-22 NOTE — Telephone Encounter (Signed)
-----   Message from Truman Hayward, MD sent at 12/22/2016  3:30 PM EDT ----- Jim Wyatt needs to come back and be seen in next 2-3 weeks I am worried he may have failed with resistance but hopefully this is just due to lapse in his meds

## 2016-12-22 NOTE — Telephone Encounter (Signed)
Left message asking patient to call for an appointment with Dr Tommy Medal or pharmacy. [name], RN

## 2016-12-25 ENCOUNTER — Telehealth: Payer: Self-pay | Admitting: Pharmacist Clinician (PhC)/ Clinical Pharmacy Specialist

## 2016-12-25 NOTE — Telephone Encounter (Signed)
The reason that his VL has rebound is that he is off of therapy due to insurance. He couldn't qualify for ADAP due to income. His insurance will kick in 2 days. He should have let us know so we can use [employer].

## 2016-12-25 NOTE — Telephone Encounter (Signed)
Thanks Michelle

## 2016-12-25 NOTE — Telephone Encounter (Signed)
He was IMO minimizing how long he had been off ARV

## 2016-12-25 NOTE — Telephone Encounter (Signed)
Left another VM to call back to check on adherence. VL up again.

## 2016-12-27 LAB — HIV-1 INTEGRASE GENOTYPE

## 2016-12-27 LAB — HIV-1 GENOTYPE: HIV-1 Genotype: DETECTED — AB

## 2016-12-27 LAB — HIV RNA, RTPCR W/R GT (RTI, PI,INT)
HIV 1 RNA Quant: 9400 copies/mL — ABNORMAL HIGH
HIV-1 RNA Quant, Log: 3.97 Log copies/mL — ABNORMAL HIGH

## 2017-01-12 DIAGNOSIS — N5082 Scrotal pain: Secondary | ICD-10-CM | POA: Diagnosis not present

## 2017-01-12 DIAGNOSIS — N19 Unspecified kidney failure: Secondary | ICD-10-CM | POA: Diagnosis not present

## 2017-01-12 DIAGNOSIS — R109 Unspecified abdominal pain: Secondary | ICD-10-CM | POA: Diagnosis not present

## 2017-01-12 DIAGNOSIS — R1011 Right upper quadrant pain: Secondary | ICD-10-CM | POA: Diagnosis not present

## 2017-01-20 DIAGNOSIS — R079 Chest pain, unspecified: Secondary | ICD-10-CM | POA: Diagnosis not present

## 2017-01-22 ENCOUNTER — Other Ambulatory Visit: Payer: Self-pay | Admitting: Pharmacist Clinician (PhC)/ Clinical Pharmacy Specialist

## 2017-01-22 MED ORDER — DICYCLOMINE HCL 20 MG PO TABS: 20.0000 mg | ORAL_TABLET | Freq: Four times a day (QID) | ORAL | 0 refills | Status: DC | PRN

## 2017-01-22 NOTE — Progress Notes (Signed)
Jim Wyatt called to stated that he started on [place] and is having multiple episodes of diarrhea and stomach cramping. Advised him that it shouldn't last long and it's very surprising that this has caused that. We will send some PRN Bentyl to help with both and he should call back if he has more issue.

## 2017-01-23 NOTE — Progress Notes (Signed)
I am surprised as well. Hopefully she can power through this

## 2017-01-26 DIAGNOSIS — N049 Nephrotic syndrome with unspecified morphologic changes: Secondary | ICD-10-CM | POA: Diagnosis not present

## 2017-01-26 DIAGNOSIS — Z992 Dependence on renal dialysis: Secondary | ICD-10-CM | POA: Diagnosis not present

## 2017-01-26 DIAGNOSIS — N186 End stage renal disease: Secondary | ICD-10-CM | POA: Diagnosis not present

## 2017-01-31 ENCOUNTER — Encounter: Payer: Self-pay | Admitting: Internal Medicine

## 2017-01-31 ENCOUNTER — Ambulatory Visit: Payer: BLUE CROSS/BLUE SHIELD | Attending: Internal Medicine | Admitting: Internal Medicine

## 2017-01-31 VITALS — BP 156/88 | HR 104 | Temp 98.2°F | Resp 18 | Ht 67.0 in | Wt 163.0 lb

## 2017-01-31 DIAGNOSIS — Z992 Dependence on renal dialysis: Secondary | ICD-10-CM | POA: Diagnosis not present

## 2017-01-31 DIAGNOSIS — N186 End stage renal disease: Secondary | ICD-10-CM | POA: Insufficient documentation

## 2017-01-31 DIAGNOSIS — I120 Hypertensive chronic kidney disease with stage 5 chronic kidney disease or end stage renal disease: Secondary | ICD-10-CM | POA: Insufficient documentation

## 2017-01-31 DIAGNOSIS — Z888 Allergy status to other drugs, medicaments and biological substances status: Secondary | ICD-10-CM | POA: Insufficient documentation

## 2017-01-31 DIAGNOSIS — Z881 Allergy status to other antibiotic agents status: Secondary | ICD-10-CM | POA: Diagnosis not present

## 2017-01-31 DIAGNOSIS — L02234 Carbuncle of groin: Secondary | ICD-10-CM | POA: Diagnosis not present

## 2017-01-31 DIAGNOSIS — B20 Human immunodeficiency virus [HIV] disease: Secondary | ICD-10-CM | POA: Insufficient documentation

## 2017-01-31 DIAGNOSIS — J45909 Unspecified asthma, uncomplicated: Secondary | ICD-10-CM | POA: Diagnosis not present

## 2017-01-31 DIAGNOSIS — R569 Unspecified convulsions: Secondary | ICD-10-CM | POA: Diagnosis not present

## 2017-01-31 DIAGNOSIS — Z882 Allergy status to sulfonamides status: Secondary | ICD-10-CM | POA: Insufficient documentation

## 2017-01-31 DIAGNOSIS — I10 Essential (primary) hypertension: Secondary | ICD-10-CM | POA: Diagnosis not present

## 2017-01-31 DIAGNOSIS — L0293 Carbuncle, unspecified: Secondary | ICD-10-CM | POA: Insufficient documentation

## 2017-01-31 DIAGNOSIS — Z79899 Other long term (current) drug therapy: Secondary | ICD-10-CM | POA: Insufficient documentation

## 2017-01-31 DIAGNOSIS — Z880 Allergy status to penicillin: Secondary | ICD-10-CM | POA: Insufficient documentation

## 2017-01-31 DIAGNOSIS — R197 Diarrhea, unspecified: Secondary | ICD-10-CM | POA: Insufficient documentation

## 2017-01-31 MED ORDER — DOXYCYCLINE HYCLATE 100 MG PO TABS: 100.0000 mg | ORAL_TABLET | Freq: Two times a day (BID) | ORAL | 0 refills | Status: AC

## 2017-01-31 MED ORDER — LOSARTAN POTASSIUM 100 MG PO TABS: 100.0000 mg | ORAL_TABLET | Freq: Every day | ORAL | 3 refills | Status: AC

## 2017-01-31 MED ORDER — ALBUTEROL SULFATE HFA 108 (90 BASE) MCG/ACT IN AERS: 2.0000 | INHALATION_SPRAY | Freq: Four times a day (QID) | RESPIRATORY_TRACT | 3 refills | Status: AC | PRN

## 2017-01-31 NOTE — Patient Instructions (Signed)
End-Stage Kidney Disease End-stage kidney disease occurs when the kidneys are so damaged that they cannot do their job. The kidneys are two organs that do many important jobs in the body, which include: Removing wastes and extra fluids from the blood. Making hormones that maintain the amount of fluid in your tissues and blood vessels. Maintaining the right amount of fluids and chemicals in the body.  When the kidneys are damaged and cannot do their job, life-threatening problems occur. Without the help of the kidneys, toxins build up in the blood. In end-stage kidney disease, the kidneys cannot get better. What are the causes? End-stage kidney disease usually occurs when a long-lasting (chronic) kidney disease gets worse. It may also occur after the kidneys are suddenly damaged (acute kidney injury). What increases the risk? This condition is more likely to develop in people who are: Older than age 109. Male. Of African-American descent. Current smokers or former smokers. Obese.  You may also have an increased risk for end-stage kidney disease if you: Have a family history of chronic kidney disease (CKD). Have had kidney disease for many years. Have other longstanding medical conditions that affect the kidneys, such as: Cardiovascular disease including high blood pressure. Diabetes. Certain diseases that affect the immune system.  What are the signs or symptoms? Swelling (edema) of the face, legs, ankles, or feet. Numbness, tingling, or loss of feeling (sensation) in your hands or feet. Tiredness (lethargy). Nausea or vomiting. Confusion, trouble concentrating, or loss of consciousness. Chest pain. Shortness of breath. Little to no urine production. Muscle twitches and cramps, especially in the legs. Constant itchiness. Loss of appetite. Pale skin and tissue lining your eyelids (conjunctiva). Headaches. Abnormally dark or light skin. Decrease in muscle size (muscle  wasting). Easy bruising. Frequent hiccups. Stopping of menstruation in women. Seizures. How is this diagnosed? Your health care provider will measure your blood pressure and do some tests. These may include: Urine tests. Blood tests. Imaging tests. A test in which a sample of tissue is removed from the kidneys to be looked at under a microscope (kidney biopsy).  How is this treated? There are two treatments for end-stage kidney disease: A procedure that removes toxic wastes from the body (dialysis). Depending on the type of dialysis you choose, it may be performed more than one time a day (peritoneal dialysis) or several times a week (hemodialysis). Surgery toreceive a new kidney (kidney transplant).  In addition to having dialysis or a kidney transplant, you may need to take medicines: To control high blood pressure (hypertension). To control cholesterol. To maintain healthy electrolyte levels in your blood.  You may also be given a specific diet to follow that includes requirements or limits for: Salt (sodium). Protein. Phosphorous. Potassium. Calcium.  Follow these instructions at home: Follow your prescribed diet. Take over-the-counter and prescription medicines only as told by your health care provider. Do not take any new medicines unless approved by your health care provider. Many medicines can worsen your kidney damage. Do not take any vitamin and mineral supplements unless approved by your health care provider. Many nutritional supplements can worsen your kidney damage. The dose of some medicines that you take may need to be adjusted. Do not use any tobacco products, such as cigarettes, chewing tobacco, and e-cigarettes. If you need help quitting, ask your health care provider. Keep all follow-up visits as told by your health care provider. This is important. Keep track of your blood pressure. Report changes in your blood pressure as told  by your health care  provider. Achieve and maintain a healthy weight. If you need help with this, ask your health care provider. Start or continue an exercise plan. Try to exercise at least 30 minutes a day, 5 days a week. Stay current with immunizations as told by your health care provider. Where to find more information: American Association of Kidney Patients: BombTimer.gl National Kidney Foundation: www.kidney.[place]: https://mathis.com/ [facility]: www.lifeoptions.org and www.kidneyschool.org Contact a health care provider if: Your symptoms get worse. You develop new symptoms. Get help right away if: You have weakness in an arm or leg on one side of your body. You have difficulty speaking or you are slurring your speech. You have a sudden change in your vision. You have a sudden, severe headache. You have a sudden weight increase. You have difficulty breathing. Your symptoms suddenly get worse. This information is not intended to replace advice given to you by your health care provider. Make sure you discuss any questions you have with your health care provider. Document Released: 05/06/2003 Document Revised: 07/22/2015 Document Reviewed: 10/13/2011 Elsevier Interactive Patient Education  2017 Elsevier Inc. Skin Abscess A skin abscess is an infected area on or under your skin that contains pus and other material. An abscess can happen almost anywhere on your body. Some abscesses break open (rupture) on their own. Most continue to get worse unless they are treated. The infection can spread deeper into the body and into your blood, which can make you feel sick. Treatment usually involves draining the abscess. Follow these instructions at home: Abscess Care  If you have an abscess that has not drained, place a warm, clean, wet washcloth over the abscess several times a day. Do this as told by your doctor.  Follow instructions from your doctor about how to take care  of your abscess. Make sure you: ? Cover the abscess with a bandage (dressing). ? Change your bandage or gauze as told by your doctor. ? Wash your hands with soap and water before you change the bandage or gauze. If you cannot use soap and water, use hand sanitizer.  Check your abscess every day for signs that the infection is getting worse. Check for: ? More redness, swelling, or pain. ? More fluid or blood. ? Warmth. ? More pus or a bad smell. Medicines   Take over-the-counter and prescription medicines only as told by your doctor.  If you were prescribed an antibiotic medicine, take it as told by your doctor. Do not stop taking the antibiotic even if you start to feel better. General instructions  To avoid spreading the infection: ? Do not share personal care items, towels, or hot tubs with others. ? Avoid making skin-to-skin contact with other people.  Keep all follow-up visits as told by your doctor. This is important. Contact a doctor if:  You have more redness, swelling, or pain around your abscess.  You have more fluid or blood coming from your abscess.  Your abscess feels warm when you touch it.  You have more pus or a bad smell coming from your abscess.  You have a fever.  Your muscles ache.  You have chills.  You feel sick. Get help right away if:  You have very bad (severe) pain.  You see red streaks on your skin spreading away from the abscess. This information is not intended to replace advice given to you by your health care provider. Make sure you discuss any questions you have  with your health care provider. Document Released: 08/02/2007 Document Revised: 10/10/2015 Document Reviewed: 12/23/2014 Elsevier Interactive Patient Education  [employer].

## 2017-01-31 NOTE — Progress Notes (Signed)
Jim Wyatt, is a 36 y.o. male  KDX:[id number]  ZJQ:[id number]  DOB - 1980/04/23  Chief Complaint  Patient presents with  . Hypertension       Subjective:   Jim Wyatt is a 36 y.o. 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 He complained of a groin swelling and pain for about a month, initially very big and hard, but now soft and painful. No pus discharge. No fever. No redness. Also complained of ongoing diarrhea for close to one month. He went to a clinic in [place] where he was prescribed antibiotics, completed but diarrhea persists. He has close to 6 BM daily, initially watery but now a little formed. No mucous or blood. He attends his dialysis sessions regularly. He feels better today otherwise. He works. He denies suicidal ideation or thoughts. He follows up regularly with [facility] for HIV/AIDs, medications recently changed, VL is up and there is a concern for resistance, but also patient has not been adherent partly due to insurance issues. Being sorted out. Patient has No headache, No chest pain, No Nausea, No new weakness tingling or numbness, No Cough - SOB.  Problem  Diarrhea  Carbuncle, Groin    ALLERGIES: Allergies  Allergen Reactions  . Bactrim [Sulfamethoxazole-Trimethoprim] Other (See Comments)    Renal failure  . Onion Hives and Other (See Comments)    Per allergy test  . Procardia [Nifedipine] Hives, Shortness Of Breath and Itching  . Reglan [Metoclopramide] Other (See Comments)    Causes ticks/ jerks  . Vancomycin Other (See Comments)    Patient states vancomycin caused kidney injury  . Metoprolol Hives  . Amlodipine Palpitations and Other (See Comments)    Causes fatigue  . Augmentin [Amoxicillin-Pot Clavulanate] Hives    PAST MEDICAL HISTORY: Past Medical History:  Diagnosis Date  . Asthma   . CAP (community  acquired pneumonia) 10/2013  . Cellulitis 11/2013   LLE  . DVT (deep venous thrombosis) ([place]) 10/2013   BLE  . ESRD on hemodialysis ([place]) 01/2015   AKI with nephrotic syndrome (16gm proteinuria at time of biopsy, from collapsing FSG). 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  Went on HD Dec 2016.   [name] Fever   . Headache   . HIV infection ([place]) dx'd 10/2013  . Hypertension   . Leg pain   . Multiple environmental allergies    "trees, dogs, cats"  . Multiple food allergies   . Seizures ([place])    last seizure per pt in Jan  . Shingles < 2010    MEDICATIONS AT HOME: Prior to Admission medications   Medication Sig Start Date End Date Taking? Authorizing Provider  acetaminophen (TYLENOL) 500 MG tablet Take 1,000 mg by mouth every 6 (six) hours as needed (pain).   Yes [provider]  albuterol (PROVENTIL HFA;VENTOLIN HFA) 108 (90 Base) MCG/ACT inhaler Inhale 2 puffs into the lungs every 6 (six) hours as needed for wheezing or shortness of breath. 01/31/17  Yes [name], MD  bictegravir-emtricitabine-tenofovir AF (BIKTARVY) 50-200-25 MG TABS tablet Take 1 tablet by mouth daily. 12/18/16  Yes Tommy Medal, Lavell Islam, MD  cloNIDine (CATAPRES - DOSED IN MG/24 HR) 0.3 mg/24hr patch Place 1 patch (0.3 mg total) onto the skin every Sunday. 10/01/16  Yes Waldron Session, MD  dapsone 100 MG tablet Take  1 tablet (100 mg total) by mouth daily. 12/18/16  Yes Tommy Medal, Lavell Islam, MD  dicyclomine (BENTYL) 20 MG tablet Take 1 tablet (20 mg total) by mouth every 6 (six) hours as needed for spasms. 01/22/17  Yes Tommy Medal, Lavell Islam, MD  isosorbide mononitrate (IMDUR) 60 MG 24 hr tablet Take 1 tablet (60 mg total) by mouth daily. 08/23/16  Yes Valinda Party, DO  levETIRAcetam (KEPPRA) 250 MG tablet Take 1 tablet (250 mg) by mouth on Tuesday, Thursday, Saturday at 7pm Patient taking differently: Take 250 mg by mouth See admin instructions.  Take one tablet (250 mg) by mouth every Tuesday, Thursday and Saturday at 7 pm 03/13/16  Yes Cameron Sprang, MD  losartan (COZAAR) 100 MG tablet Take 1 tablet (100 mg total) by mouth daily. 01/31/17  Yes [name], MD  diclofenac sodium (VOLTAREN) 1 % GEL Apply 2 g topically 4 (four) times daily as needed. For painful area on left side of back of neck. Patient not taking: Reported on 12/18/2016 10/08/16   Modena Jansky, MD  doxycycline (VIBRA-TABS) 100 MG tablet Take 1 tablet (100 mg total) by mouth 2 (two) times daily for 7 days. 01/31/17 02/07/17  [name], MD  famotidine (PEPCID) 20 MG tablet Take 1 tablet (20 mg total) by mouth 2 (two) times daily as needed for heartburn (acid reflux). Patient not taking: Reported on 12/18/2016 10/08/16   Modena Jansky, MD  gabapentin (NEURONTIN) 100 MG capsule Take 1 capsule (100 mg total) by mouth daily. Patient not taking: Reported on 12/18/2016 10/08/16   Modena Jansky, MD  hydrOXYzine (ATARAX/VISTARIL) 25 MG tablet Take 1 tablet (25 mg total) by mouth 3 (three) times daily as needed for itching. Patient not taking: Reported on 12/18/2016 07/05/16   [name], MD  topiramate (TOPAMAX) 50 MG tablet Take 1 tablet (50 mg total) by mouth 2 (two) times daily. Patient not taking: Reported on 12/18/2016 06/22/16   Smiley Houseman, MD    Objective:   Vitals:   01/31/17 1058  BP: (!) 156/88  Pulse: (!) 104  Resp: 18  Temp: 98.2 F (36.8 C)  TempSrc: Oral  SpO2: 97%  Weight: 163 lb (73.9 kg)  Height: 5\' 7"  (1.702 m)   Exam General appearance : Awake, alert, not in any distress. Speech Clear. Chronically ill-looking HEENT: Atraumatic and Normocephalic, pupils equally reactive to light and accomodation Neck: Supple, no JVD. No cervical lymphadenopathy.  Chest: Good air entry bilaterally, no added sounds  CVS: S1 S2 regular, no murmurs.  Abdomen: Bowel sounds present, Non tender and not distended with no  gaurding, rigidity or rebound. Extremities: Upper limbs AV fistulae. B/L Lower Ext shows no edema, both legs are warm to touch Neurology: Awake alert, and oriented X 3, CN II-XII intact, Non focal Skin: No Rash  Data Review Lab Results  Component Value Date   HGBA1C <4.2 (L) 05/12/2016    Assessment & Plan   1. Carbuncle, groin  - doxycycline (VIBRA-TABS) 100 MG tablet; Take 1 tablet (100 mg total) by mouth 2 (two) times daily for 7 days.  Dispense: 14 tablet; Refill: 0  2. ESRD on dialysis [facility])  - Continue Hemodialysis as scheduled  3. Human immunodeficiency virus (HIV) disease ([place])  - Continue follow up with [facility] - Continue current medications  4. Diarrhea, unspecified type  - Fecal leukocytes - Clostridium difficile EIA - Ova and parasite examination  5. Essential hypertension  - losartan (  COZAAR) 100 MG tablet; Take 1 tablet (100 mg total) by mouth daily.  Dispense: 90 tablet; Refill: 3  Patient have been counseled extensively about nutrition and exercise. Other issues discussed during this visit include: low cholesterol diet, weight control and daily exercise, importance of adherence with medications and regular follow-up. We also discussed long term complications of uncontrolled hypertension.   Return in about 3 months (around 05/01/2017) for CKD/ESRD.  The patient was given clear instructions to go to ER or return to medical center if symptoms don't improve, worsen or new problems develop. The patient verbalized understanding. The patient was told to call to get lab results if they haven't heard anything in the next week.   This note has been created with Surveyor, quantity. Any transcriptional errors are unintentional.    [name], MD, MHA, Karilyn Cota, [place] and [place], [place]   01/31/2017, 11:36 AM

## 2017-02-01 ENCOUNTER — Telehealth: Payer: Self-pay | Admitting: Pharmacist Clinician (PhC)/ Clinical Pharmacy Specialist

## 2017-02-01 NOTE — Telephone Encounter (Signed)
Thanks Minh! 

## 2017-02-01 NOTE — Telephone Encounter (Signed)
I believe that he has restarted therapy after new insurance. Left a VM to call back for an appt so we can do labs.

## 2017-02-08 ENCOUNTER — Telehealth: Payer: Self-pay | Admitting: Internal Medicine

## 2017-02-08 NOTE — Telephone Encounter (Signed)
Pt called to request feedback on a fax sent by [facility]. Fax was sent November 19,2018

## 2017-03-01 ENCOUNTER — Telehealth: Payer: Self-pay | Admitting: Pharmacist Clinician (PhC)/ Clinical Pharmacy Specialist

## 2017-03-01 NOTE — Telephone Encounter (Signed)
Left him another VM to call back for an appt so we can do labs and schedule a f/u with Dr. Tommy Medal

## 2017-03-01 NOTE — Telephone Encounter (Signed)
Thanks Minh! 

## 2017-03-01 NOTE — Telephone Encounter (Signed)
Rudolfo called back to schedule his appt. Made him a visit to see pharmacy so we can do labs. Apparently, he has moved to [place] now but still works here a couple of days a week. Told him that if future appt will be an issue, he should tx his care to a closer clinic.

## 2017-03-07 ENCOUNTER — Telehealth: Payer: Self-pay | Admitting: *Deleted

## 2017-03-07 NOTE — Telephone Encounter (Signed)
Patient verified DOB Patient states his job is considering his hospital stay from october

## 2017-03-13 ENCOUNTER — Telehealth: Payer: Self-pay | Admitting: Internal Medicine

## 2017-03-13 NOTE — Telephone Encounter (Signed)
Pt says the nurse was supposed to give him a call back Please follow up

## 2017-03-14 ENCOUNTER — Telehealth: Payer: Self-pay | Admitting: Internal Medicine

## 2017-03-14 NOTE — Telephone Encounter (Signed)
Pt called in regards to his insurance not wanting to cover his most recent surgery, due to "pre-existing conditions". He states the nurse was supposed to write him a letter to counterclaim that decision, for the surgery had nothing to do with a pre-existing condition. Please follow up

## 2017-03-14 NOTE — Telephone Encounter (Signed)
Pt called in regards to a letter he was supposed to receive for his insurance stating that the reason for his surgery had nothing to do with his pre-existing medical conditions

## 2017-03-15 NOTE — Telephone Encounter (Signed)
Patient verified DOB Patient is aware of needing to contact general surgeon Romero Belling in regards to constructing a letter verifying the reason for the surgery in OCT of 2017.

## 2017-03-22 ENCOUNTER — Ambulatory Visit (INDEPENDENT_AMBULATORY_CARE_PROVIDER_SITE_OTHER): Payer: BLUE CROSS/BLUE SHIELD | Admitting: Pharmacist Clinician (PhC)/ Clinical Pharmacy Specialist

## 2017-03-22 DIAGNOSIS — Z23 Encounter for immunization: Secondary | ICD-10-CM | POA: Diagnosis not present

## 2017-03-22 DIAGNOSIS — B20 Human immunodeficiency virus [HIV] disease: Secondary | ICD-10-CM | POA: Diagnosis not present

## 2017-03-22 NOTE — Progress Notes (Signed)
HPI: Jim Wyatt is a 37 y.o. male who is here to see pharmacy so we can do labs and set up appt for him.  Allergies: Allergies  Allergen Reactions  . Bactrim [Sulfamethoxazole-Trimethoprim] Other (See Comments)    Renal failure  . Onion Hives and Other (See Comments)    Per allergy test  . Procardia [Nifedipine] Hives, Shortness Of Breath and Itching  . Reglan [Metoclopramide] Other (See Comments)    Causes ticks/ jerks  . Vancomycin Other (See Comments)    Patient states vancomycin caused kidney injury  . Metoprolol Hives  . Amlodipine Palpitations and Other (See Comments)    Causes fatigue  . Augmentin [Amoxicillin-Pot Clavulanate] Hives    Vitals:    Past Medical History: Past Medical History:  Diagnosis Date  . Asthma   . CAP (community acquired pneumonia) 10/2013  . Cellulitis 11/2013   LLE  . DVT (deep venous thrombosis) ([place]) 10/2013   BLE  . ESRD on hemodialysis ([place]) 01/2015   AKI with nephrotic syndrome (16gm proteinuria at time of biopsy, from collapsing FSG). 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  Went on HD Dec 2016.   [name] Fever   . Headache   . HIV infection ([place]) dx'd 10/2013  . Hypertension   . Leg pain   . Multiple environmental allergies    "trees, dogs, cats"  . Multiple food allergies   . Seizures ([place])    last seizure per pt in Jan  . Shingles < 2010    Social History: Social History   Socioeconomic History  . Marital status: Single    Spouse name: Not on file  . Number of children: Not on file  . Years of education: Not on file  . Highest education level: Not on file  Social Needs  . Financial resource strain: Not on file  . Food insecurity - worry: Not on file  . Food insecurity - inability: Not on file  . Transportation needs - medical: Not on file  . Transportation needs - non-medical: Not on file  Occupational History  . Not on file  Tobacco Use  . Smoking status:  Never Smoker  . Smokeless tobacco: Never Used  Substance and Sexual Activity  . Alcohol use: No    Comment: I stopped drinking along time ago "  . Drug use: No  . Sexual activity: Not Currently    Partners: Female    Birth control/protection: Condom  Other Topics Concern  . Not on file  Social History Narrative  . Not on file    Previous Regimen: ABC/DTG/Prez, Descovy/Tivicay  Current Regimen:  Biktarvy  Labs: HIV 1 RNA Quant (copies/mL)  Date Value  12/18/2016 9,400 (H)  09/04/2016 25 (H)  08/14/2016 79 (H)   CD4 T Cell Abs (/uL)  Date Value  12/18/2016 130 (L)  09/04/2016 140 (L)  08/14/2016 90 (L)   Hep B S Ab (no units)  Date Value  12/30/2015 Non Reactive   Hepatitis B Surface Ag (no units)  Date Value  03/16/2016 Negative   HCV Ab (no units)  Date Value  11/10/2013 NEGATIVE    CrCl: CrCl cannot be calculated (Patient's most recent lab result is older than the maximum 21 days allowed.).  Lipids:    Component Value Date/Time   CHOL 100 11/15/2013 0820   TRIG 327 (H) 11/15/2013 0820   HDL 8 (L) 11/15/2013 0820   CHOLHDL 12.5 11/15/2013 0820  VLDL 65 (H) 11/15/2013 0820   LDLCALC 27 11/15/2013 0820    Assessment: Jim Wyatt has not seen [place] Oct. He is an ESRD HIV patient. His management has been rocky at times due to jobs, insurance, travel, Social research officer, government. He still works at [employer] on certain days. Those are the days that he can make the appt because he lives in [place]. He had a lapse in therapy in Oct because of insurance. He restarted therapy in early Nov. He claimed to be adherent to his ART. If that is the case, his VL should be good by now. We are going to get all labs today and set up a f/u appt with Dr. Tommy Medal in Feb.   He complains of bumps on his penis. I was able to take a picture of it so Dr. Tommy Medal can suggest treatment for it. HPV? We can call him next week with the treatment recommendation.       Recommendations:  HIV VL with  reflex, CD4, RPR Continue Biktarvy 1 PO qday F/u with Dr. Tommy Medal in Feb Will determine HPV treatment Second Menveo today   Jim Wyatt, PharmD, BCPS, AAHIVP, CPP Clinical Infectious [facility] 03/22/2017, 3:09 PM

## 2017-03-23 ENCOUNTER — Telehealth: Payer: Self-pay | Admitting: Pharmacist Clinician (PhC)/ Clinical Pharmacy Specialist

## 2017-03-23 LAB — RPR: RPR Ser Ql: NONREACTIVE

## 2017-03-23 LAB — T-HELPER CELL (CD4) - (RCID CLINIC ONLY)
CD4 % Helper T Cell: 16 % — ABNORMAL LOW (ref 33–55)
CD4 T Cell Abs: 150 /uL — ABNORMAL LOW (ref 400–2700)

## 2017-03-23 MED ORDER — IMIQUIMOD 5 % EX CREA: TOPICAL_CREAM | CUTANEOUS | 0 refills | Status: DC

## 2017-03-23 NOTE — Telephone Encounter (Signed)
Left Marcello Moores a VM to say that we sent some Aldara Cream to Jose[facility]'s pharmacy for his suspected HPV.

## 2017-03-23 NOTE — Addendum Note (Signed)
Addended by: Onnie Boer Q on: 03/23/2017 11:11 AM   Modules accepted: Orders

## 2017-03-26 ENCOUNTER — Telehealth: Payer: Self-pay

## 2017-03-26 NOTE — Telephone Encounter (Signed)
Received a PA for Jim Wyatt for Imiquimod 5% EX CREAM through cover my meds was able to initiate the PA request today and just waiting on a response. [place]

## 2017-03-27 LAB — HIV RNA, RTPCR W/R GT (RTI, PI,INT)
HIV 1 RNA Quant: <20 copies/mL
HIV-1 RNA Quant, Log: <1.3 Log copies/mL

## 2017-03-29 ENCOUNTER — Telehealth: Payer: Self-pay | Admitting: Pharmacist Clinician (PhC)/ Clinical Pharmacy Specialist

## 2017-03-29 NOTE — Telephone Encounter (Signed)
Jim Wyatt is now approved for the Aldara cream now for 16 weeks. Let Jim Wyatt's pharmacy knows to deliver to him.

## 2017-03-29 NOTE — Telephone Encounter (Signed)
Thanks so much Minh! 

## 2017-04-26 ENCOUNTER — Encounter: Payer: Self-pay | Admitting: Infectious Disease

## 2017-04-26 ENCOUNTER — Ambulatory Visit (INDEPENDENT_AMBULATORY_CARE_PROVIDER_SITE_OTHER): Payer: BLUE CROSS/BLUE SHIELD | Admitting: Infectious Disease

## 2017-04-26 VITALS — BP 169/104 | HR 103 | Temp 98.4°F | Ht 67.0 in | Wt 158.0 lb

## 2017-04-26 DIAGNOSIS — N186 End stage renal disease: Secondary | ICD-10-CM

## 2017-04-26 DIAGNOSIS — Z23 Encounter for immunization: Secondary | ICD-10-CM

## 2017-04-26 DIAGNOSIS — R51 Headache: Secondary | ICD-10-CM

## 2017-04-26 DIAGNOSIS — I10 Essential (primary) hypertension: Secondary | ICD-10-CM | POA: Diagnosis not present

## 2017-04-26 DIAGNOSIS — R519 Headache, unspecified: Secondary | ICD-10-CM

## 2017-04-26 DIAGNOSIS — N08 Glomerular disorders in diseases classified elsewhere: Secondary | ICD-10-CM

## 2017-04-26 DIAGNOSIS — B20 Human immunodeficiency virus [HIV] disease: Secondary | ICD-10-CM | POA: Diagnosis not present

## 2017-04-26 MED ORDER — DAPSONE 100 MG PO TABS: 100.0000 mg | ORAL_TABLET | Freq: Every day | ORAL | 11 refills | Status: DC

## 2017-04-26 MED ORDER — BICTEGRAVIR-EMTRICITAB-TENOFOV 50-200-25 MG PO TABS: 1.0000 | ORAL_TABLET | Freq: Every day | ORAL | 11 refills | Status: DC

## 2017-04-26 NOTE — Progress Notes (Signed)
Subjective:    Chief complaint: headache   Patient ID: Jim Wyatt, male    DOB: 1980-10-28, 37 y.o.   MRN: [id number]  HPI  37 y.o. 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  He has been treated with > 21 days of clinda, primaquin and prednisone (was still taking prednisone and clindamycin with him).  We simplified his prior regimen to Tivicay and Descovy and he dropped his VL nicely from over 150K to 149 copies and downwards and more recently to 25 in July. We have swtiched him to [facility]. 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 He has now purchased separate insurance which he believes will work for meds now. He is also with Medicare for his HD but has Surveyor, quantity for meds.   He is suppressed again in January, 2019.   Lab Results  Component Value Date   HIV1RNAQUANT <20 DETECTED 03/22/2017   HIV1RNAQUANT 9,400 (H) 12/18/2016   HIV1RNAQUANT 25 (H) 09/04/2016   Lab Results  Component Value Date   CD4TABS 150 (L) 03/22/2017   CD4TABS 130 (L) 12/18/2016   CD4TABS 140 (L) 09/04/2016   He came in with HA and states it is time for him to take his afternoon meds. He states that his BP is "actually low for me" in 932T systolic.   He is trying to get a renal transplant.     Past Medical History:  Diagnosis Date  . Asthma   . CAP (community acquired pneumonia) 10/2013  . Cellulitis 11/2013   LLE  . DVT (deep venous thrombosis) ([place]) 10/2013   BLE  . ESRD on hemodialysis ([place]) 01/2015   AKI with nephrotic syndrome (16gm proteinuria at time of biopsy, from collapsing FSG). 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  Went on HD Dec 2016.   [name] Fever   . Headache   . HIV infection ([place]) dx'd 10/2013  . Hypertension   . Leg pain   . Multiple environmental allergies    "trees, dogs, cats"    . Multiple food allergies   . Seizures ([place])    last seizure per pt in Jan  . Shingles < 2010    Past Surgical History:  Procedure Laterality Date  . AV FISTULA PLACEMENT Left 03/03/2015   Procedure: Creation of Left Arm RADIOCEPHALIC ARTERIOVENOUS FISTULA ;  Surgeon: Mal Misty, MD;  Location: [facility];  Service: Vascular;  Laterality: Left;  . COLONOSCOPY N/A 03/18/2016   Procedure: COLONOSCOPY;  Surgeon: Manus Gunning, MD;  Location: [facility];  Service: Gastroenterology;  Laterality: N/A;  . FEMUR FRACTURE SURGERY Right 09/2001  . FRACTURE SURGERY    . I&D EXTREMITY Right 12/18/2014   Procedure: IRRIGATION AND DEBRIDEMENT RIGHT LOWER EXTREMITY;  Surgeon: Ralene Ok, MD;  Location: [facility];  Service: General;  Laterality: Right;  . INSERTION OF DIALYSIS CATHETER  2016  . LAPAROTOMY N/A 12/28/2015   Procedure: EXPLORATORY LAPAROTOMY BOWEL RESECTION ILEOCECECTOMY;  Surgeon: Clovis Riley, MD;  Location: [facility];  Service: General;  Laterality: N/A;    Family History  Problem Relation Age of Onset  . Hypertension Mother   . Diabetes Maternal Grandmother   . Kidney disease Maternal Grandmother       Social History   Socioeconomic History  . Marital status: Single  Spouse name: None  . Number of children: None  . Years of education: None  . Highest education level: None  Social Needs  . Financial resource strain: None  . Food insecurity - worry: None  . Food insecurity - inability: None  . Transportation needs - medical: None  . Transportation needs - non-medical: None  Occupational History  . None  Tobacco Use  . Smoking status: Never Smoker  . Smokeless tobacco: Never Used  Substance and Sexual Activity  . Alcohol use: No    Comment: I stopped drinking along time ago "  . Drug use: No  . Sexual activity: Not Currently    Partners: Female    Birth control/protection: Condom  Other Topics Concern  . None  Social History Narrative  . None     Allergies  Allergen Reactions  . Bactrim [Sulfamethoxazole-Trimethoprim] Other (See Comments)    Renal failure  . Onion Hives and Other (See Comments)    Per allergy test  . Procardia [Nifedipine] Hives, Shortness Of Breath and Itching  . Reglan [Metoclopramide] Other (See Comments)    Causes ticks/ jerks  . Vancomycin Other (See Comments)    Patient states vancomycin caused kidney injury  . Metoprolol Hives  . Amlodipine Palpitations and Other (See Comments)    Causes fatigue  . Augmentin [Amoxicillin-Pot Clavulanate] Hives     Current Outpatient Medications:  .  bictegravir-emtricitabine-tenofovir AF (BIKTARVY) 50-200-25 MG TABS tablet, Take 1 tablet by mouth daily., Disp: 30 tablet, Rfl: 11 .  dapsone 100 MG tablet, Take 1 tablet (100 mg total) by mouth daily., Disp: 30 tablet, Rfl: 11 .  imiquimod (ALDARA) 5 % cream, Apply topically 3 (three) times a week. Apply a thin layer prior to bedtime 3x week and leave on for 6-10 hrs. Wash off with soap and water., Disp: 24 each, Rfl: 0 .  losartan (COZAAR) 100 MG tablet, Take 1 tablet (100 mg total) by mouth daily., Disp: 90 tablet, Rfl: 3 .  acetaminophen (TYLENOL) 500 MG tablet, Take 1,000 mg by mouth every 6 (six) hours as needed (pain)., Disp: , Rfl:  .  albuterol (PROVENTIL HFA;VENTOLIN HFA) 108 (90 Base) MCG/ACT inhaler, Inhale 2 puffs into the lungs every 6 (six) hours as needed for wheezing or shortness of breath., Disp: 1 Inhaler, Rfl: 3 .  cloNIDine (CATAPRES - DOSED IN MG/24 HR) 0.3 mg/24hr patch, Place 1 patch (0.3 mg total) onto the skin every Sunday. (Patient not taking: Reported on 04/26/2017), Disp: 4 patch, Rfl: 12 .  hydrALAZINE (APRESOLINE) 25 MG tablet, TAKE 1 TABLET BY MOUTH THREE TIMES A DAY AS DIRECTED, Disp: , Rfl: 0 .  isosorbide mononitrate (IMDUR) 60 MG 24 hr tablet, Take 1 tablet (60 mg total) by mouth daily. (Patient not taking: Reported on 04/26/2017), Disp: 30 tablet, Rfl: 0   Review of Systems   Constitutional: Negative for chills and fever.  HENT: Negative for congestion and sore throat.   Eyes: Negative for photophobia.  Respiratory: Negative for cough, shortness of breath and wheezing.   Cardiovascular: Negative for chest pain, palpitations and leg swelling.  Gastrointestinal: Negative for abdominal pain, blood in stool, constipation, diarrhea, nausea and vomiting.  Genitourinary: Negative for dysuria, flank pain and hematuria.  Musculoskeletal: Negative for back pain and myalgias.  Neurological: Positive for headaches. Negative for dizziness and weakness.  Hematological: Does not bruise/bleed easily.  Psychiatric/Behavioral: Negative for suicidal ideas.       Objective:   Physical Exam  Constitutional: He is oriented  to person, place, and time. No distress.  HENT:  Head: Normocephalic and atraumatic.  Mouth/Throat: No oropharyngeal exudate.  Eyes: Conjunctivae and EOM are normal. No scleral icterus.  Neck: Normal range of motion. Neck supple.  Cardiovascular: Normal rate and regular rhythm.  Pulmonary/Chest: Effort normal. No respiratory distress. He has no wheezes.  Abdominal: He exhibits no distension.  Musculoskeletal: He exhibits no edema or tenderness.  Neurological: He is alert and oriented to person, place, and time. Coordination normal.  Skin: Skin is warm and dry. No rash noted. He is not diaphoretic. No erythema. No pallor.  Psychiatric: He has a normal mood and affect. His behavior is normal. Judgment and thought content normal.  Nursing note and vitals reviewed.         Assessment & Plan:   HIV/AIDS: Continue BIKTARVY and dapsone for pCP prevention. 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 I suspect it is due to his BP being too high  ESRD on HD: his fistula is functional now  Hypertension: Poorly controlled again but will defer to PCP

## 2017-05-02 ENCOUNTER — Ambulatory Visit: Payer: Self-pay | Admitting: Internal Medicine

## 2017-05-04 DIAGNOSIS — R079 Chest pain, unspecified: Secondary | ICD-10-CM | POA: Diagnosis not present

## 2017-05-04 DIAGNOSIS — I161 Hypertensive emergency: Secondary | ICD-10-CM | POA: Diagnosis not present

## 2017-05-04 DIAGNOSIS — R1084 Generalized abdominal pain: Secondary | ICD-10-CM | POA: Diagnosis not present

## 2017-05-05 DIAGNOSIS — R079 Chest pain, unspecified: Secondary | ICD-10-CM | POA: Diagnosis not present

## 2017-05-05 DIAGNOSIS — J45909 Unspecified asthma, uncomplicated: Secondary | ICD-10-CM | POA: Diagnosis present

## 2017-05-05 DIAGNOSIS — I161 Hypertensive emergency: Secondary | ICD-10-CM | POA: Diagnosis present

## 2017-05-05 DIAGNOSIS — Z79899 Other long term (current) drug therapy: Secondary | ICD-10-CM | POA: Diagnosis not present

## 2017-05-05 DIAGNOSIS — T465X6A Underdosing of other antihypertensive drugs, initial encounter: Secondary | ICD-10-CM | POA: Diagnosis present

## 2017-05-05 DIAGNOSIS — Z992 Dependence on renal dialysis: Secondary | ICD-10-CM | POA: Diagnosis not present

## 2017-05-05 DIAGNOSIS — Z87891 Personal history of nicotine dependence: Secondary | ICD-10-CM | POA: Diagnosis not present

## 2017-05-05 DIAGNOSIS — D638 Anemia in other chronic diseases classified elsewhere: Secondary | ICD-10-CM | POA: Diagnosis present

## 2017-05-05 DIAGNOSIS — R1084 Generalized abdominal pain: Secondary | ICD-10-CM | POA: Diagnosis not present

## 2017-05-05 DIAGNOSIS — R109 Unspecified abdominal pain: Secondary | ICD-10-CM | POA: Diagnosis not present

## 2017-05-05 DIAGNOSIS — I120 Hypertensive chronic kidney disease with stage 5 chronic kidney disease or end stage renal disease: Secondary | ICD-10-CM | POA: Diagnosis present

## 2017-05-05 DIAGNOSIS — Z9115 Patient's noncompliance with renal dialysis: Secondary | ICD-10-CM | POA: Diagnosis not present

## 2017-05-05 DIAGNOSIS — N2581 Secondary hyperparathyroidism of renal origin: Secondary | ICD-10-CM | POA: Diagnosis present

## 2017-05-05 DIAGNOSIS — R51 Headache: Secondary | ICD-10-CM | POA: Diagnosis not present

## 2017-05-05 DIAGNOSIS — N186 End stage renal disease: Secondary | ICD-10-CM | POA: Diagnosis present

## 2017-05-05 DIAGNOSIS — Z91128 Patient's intentional underdosing of medication regimen for other reason: Secondary | ICD-10-CM | POA: Diagnosis not present

## 2017-05-05 DIAGNOSIS — B20 Human immunodeficiency virus [HIV] disease: Secondary | ICD-10-CM | POA: Diagnosis present

## 2017-05-05 DIAGNOSIS — I248 Other forms of acute ischemic heart disease: Secondary | ICD-10-CM | POA: Diagnosis present

## 2017-05-07 ENCOUNTER — Telehealth: Payer: Self-pay | Admitting: Internal Medicine

## 2017-05-07 NOTE — Telephone Encounter (Signed)
Received a fax from [facility] note, fax will on the pcp in-box

## 2017-05-08 ENCOUNTER — Telehealth: Payer: Self-pay | Admitting: Internal Medicine

## 2017-05-08 NOTE — Telephone Encounter (Signed)
Received fax from [facility] discharge note, fax will on the pcp in-box

## 2017-05-14 DIAGNOSIS — Z992 Dependence on renal dialysis: Secondary | ICD-10-CM | POA: Diagnosis not present

## 2017-05-14 DIAGNOSIS — I160 Hypertensive urgency: Secondary | ICD-10-CM | POA: Diagnosis not present

## 2017-05-14 DIAGNOSIS — D649 Anemia, unspecified: Secondary | ICD-10-CM | POA: Diagnosis not present

## 2017-05-14 DIAGNOSIS — R109 Unspecified abdominal pain: Secondary | ICD-10-CM | POA: Diagnosis not present

## 2017-05-14 DIAGNOSIS — K439 Ventral hernia without obstruction or gangrene: Secondary | ICD-10-CM | POA: Diagnosis present

## 2017-05-14 DIAGNOSIS — Z79899 Other long term (current) drug therapy: Secondary | ICD-10-CM | POA: Diagnosis not present

## 2017-05-14 DIAGNOSIS — D631 Anemia in chronic kidney disease: Secondary | ICD-10-CM | POA: Diagnosis not present

## 2017-05-14 DIAGNOSIS — K2950 Unspecified chronic gastritis without bleeding: Secondary | ICD-10-CM | POA: Diagnosis not present

## 2017-05-14 DIAGNOSIS — N186 End stage renal disease: Secondary | ICD-10-CM | POA: Diagnosis not present

## 2017-05-14 DIAGNOSIS — Z87891 Personal history of nicotine dependence: Secondary | ICD-10-CM | POA: Diagnosis not present

## 2017-05-14 DIAGNOSIS — K449 Diaphragmatic hernia without obstruction or gangrene: Secondary | ICD-10-CM | POA: Diagnosis present

## 2017-05-14 DIAGNOSIS — G40909 Epilepsy, unspecified, not intractable, without status epilepticus: Secondary | ICD-10-CM | POA: Diagnosis present

## 2017-05-14 DIAGNOSIS — R1084 Generalized abdominal pain: Secondary | ICD-10-CM | POA: Diagnosis not present

## 2017-05-14 DIAGNOSIS — J45909 Unspecified asthma, uncomplicated: Secondary | ICD-10-CM | POA: Diagnosis not present

## 2017-05-14 DIAGNOSIS — B20 Human immunodeficiency virus [HIV] disease: Secondary | ICD-10-CM | POA: Diagnosis not present

## 2017-05-14 DIAGNOSIS — K922 Gastrointestinal hemorrhage, unspecified: Secondary | ICD-10-CM | POA: Diagnosis not present

## 2017-05-14 DIAGNOSIS — I120 Hypertensive chronic kidney disease with stage 5 chronic kidney disease or end stage renal disease: Secondary | ICD-10-CM | POA: Diagnosis not present

## 2017-05-28 DIAGNOSIS — N186 End stage renal disease: Secondary | ICD-10-CM | POA: Diagnosis not present

## 2017-05-28 DIAGNOSIS — R0602 Shortness of breath: Secondary | ICD-10-CM | POA: Diagnosis not present

## 2017-05-28 DIAGNOSIS — D638 Anemia in other chronic diseases classified elsewhere: Secondary | ICD-10-CM | POA: Diagnosis not present

## 2017-06-28 DIAGNOSIS — N261 Atrophy of kidney (terminal): Secondary | ICD-10-CM | POA: Diagnosis not present

## 2017-06-28 DIAGNOSIS — R109 Unspecified abdominal pain: Secondary | ICD-10-CM | POA: Diagnosis not present

## 2017-06-28 DIAGNOSIS — Z87448 Personal history of other diseases of urinary system: Secondary | ICD-10-CM | POA: Diagnosis not present

## 2017-06-28 DIAGNOSIS — R110 Nausea: Secondary | ICD-10-CM | POA: Diagnosis not present

## 2017-06-28 DIAGNOSIS — D61818 Other pancytopenia: Secondary | ICD-10-CM | POA: Diagnosis not present

## 2017-06-29 DIAGNOSIS — M7918 Myalgia, other site: Secondary | ICD-10-CM | POA: Diagnosis not present

## 2017-06-29 DIAGNOSIS — R109 Unspecified abdominal pain: Secondary | ICD-10-CM | POA: Diagnosis not present

## 2017-06-29 DIAGNOSIS — B20 Human immunodeficiency virus [HIV] disease: Secondary | ICD-10-CM | POA: Diagnosis not present

## 2017-06-29 DIAGNOSIS — N186 End stage renal disease: Secondary | ICD-10-CM | POA: Diagnosis not present

## 2017-07-30 ENCOUNTER — Other Ambulatory Visit: Payer: Self-pay

## 2017-07-30 DIAGNOSIS — B20 Human immunodeficiency virus [HIV] disease: Secondary | ICD-10-CM

## 2017-07-31 LAB — CBC WITH DIFFERENTIAL/PLATELET
Basophils Absolute: 40 cells/uL (ref 0–200)
Basophils Relative: 1 %
Eosinophils Absolute: 220 cells/uL (ref 15–500)
Eosinophils Relative: 5.5 %
HCT: 27.9 % — ABNORMAL LOW (ref 38.5–50.0)
Hemoglobin: 9.3 g/dL — ABNORMAL LOW (ref 13.2–17.1)
Lymphs Abs: 728 cells/uL — ABNORMAL LOW (ref 850–3900)
MCH: 31.4 pg (ref 27.0–33.0)
MCHC: 33.3 g/dL (ref 32.0–36.0)
MCV: 94.3 fL (ref 80.0–100.0)
MPV: 11.9 fL (ref 7.5–12.5)
Monocytes Relative: 8.7 %
Neutro Abs: 2664 cells/uL (ref 1500–7800)
Neutrophils Relative %: 66.6 %
Platelets: 113 10*3/uL — ABNORMAL LOW (ref 140–400)
RBC: 2.96 10*6/uL — ABNORMAL LOW (ref 4.20–5.80)
RDW: 15.7 % — ABNORMAL HIGH (ref 11.0–15.0)
Total Lymphocyte: 18.2 %
WBC mixed population: 348 cells/uL (ref 200–950)
WBC: 4 10*3/uL (ref 3.8–10.8)

## 2017-07-31 LAB — T-HELPER CELL (CD4) - (RCID CLINIC ONLY)
CD4 % Helper T Cell: 20 % — ABNORMAL LOW (ref 33–55)
CD4 T Cell Abs: 150 /uL — ABNORMAL LOW (ref 400–2700)

## 2017-07-31 LAB — RPR: RPR Ser Ql: NONREACTIVE

## 2017-08-01 DIAGNOSIS — R29818 Other symptoms and signs involving the nervous system: Secondary | ICD-10-CM | POA: Diagnosis not present

## 2017-08-01 DIAGNOSIS — H5462 Unqualified visual loss, left eye, normal vision right eye: Secondary | ICD-10-CM | POA: Diagnosis not present

## 2017-08-01 DIAGNOSIS — I10 Essential (primary) hypertension: Secondary | ICD-10-CM | POA: Diagnosis not present

## 2017-08-01 DIAGNOSIS — R009 Unspecified abnormalities of heart beat: Secondary | ICD-10-CM | POA: Diagnosis not present

## 2017-08-01 LAB — HIV-1 RNA QUANT-NO REFLEX-BLD
HIV 1 RNA Quant: 41 copies/mL — ABNORMAL HIGH
HIV-1 RNA Quant, Log: 1.61 Log copies/mL — ABNORMAL HIGH

## 2017-08-02 DIAGNOSIS — R29818 Other symptoms and signs involving the nervous system: Secondary | ICD-10-CM | POA: Diagnosis not present

## 2017-08-14 ENCOUNTER — Other Ambulatory Visit: Payer: Self-pay

## 2017-08-28 ENCOUNTER — Ambulatory Visit (INDEPENDENT_AMBULATORY_CARE_PROVIDER_SITE_OTHER): Payer: Self-pay | Admitting: Infectious Disease

## 2017-08-28 ENCOUNTER — Encounter: Payer: Self-pay | Admitting: Infectious Disease

## 2017-08-28 VITALS — BP 172/106 | HR 66 | Temp 97.9°F | Wt 157.4 lb

## 2017-08-28 DIAGNOSIS — Z79899 Other long term (current) drug therapy: Secondary | ICD-10-CM

## 2017-08-28 DIAGNOSIS — Z113 Encounter for screening for infections with a predominantly sexual mode of transmission: Secondary | ICD-10-CM

## 2017-08-28 DIAGNOSIS — I150 Renovascular hypertension: Secondary | ICD-10-CM

## 2017-08-28 DIAGNOSIS — B20 Human immunodeficiency virus [HIV] disease: Secondary | ICD-10-CM

## 2017-08-28 DIAGNOSIS — N186 End stage renal disease: Secondary | ICD-10-CM

## 2017-08-28 DIAGNOSIS — Z992 Dependence on renal dialysis: Secondary | ICD-10-CM

## 2017-08-28 DIAGNOSIS — Z23 Encounter for immunization: Secondary | ICD-10-CM | POA: Insufficient documentation

## 2017-08-28 HISTORY — DX: Encounter for immunization: Z23

## 2017-08-28 NOTE — Progress Notes (Signed)
Subjective:    Chief complaint: followup for HIV   Patient ID: Jim Wyatt, male    DOB: 28-May-1980, 37 y.o.   MRN: [id number]  HPI  37 y.o. 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  He has been treated with > 21 days of clinda, primaquin and prednisone (was still taking prednisone and clindamycin with him).  We simplified his prior regimen to Tivicay and Descovy and he dropped his VL nicely from over 150K to 149 copies and downwards and more recently to 25 in July. We have swtiched him to [facility]. 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 He has now purchased separate insurance which he believes will work for meds now. 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  He has sufficient BIKTARVY for now though is going to purchase insurance through work.    Past Medical History:  Diagnosis Date  . Asthma   . CAP (community acquired pneumonia) 10/2013  . Cellulitis 11/2013   LLE  . DVT (deep venous thrombosis) ([place]) 10/2013   BLE  . ESRD on hemodialysis ([place]) 01/2015   AKI with nephrotic syndrome (16gm proteinuria at time of biopsy, from collapsing FSG). 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  Went on HD Dec 2016.   [name] Fever   . Headache   . HIV infection ([place]) dx'd 10/2013  . Hypertension   . Leg pain   . Multiple environmental allergies    "trees, dogs, cats"  . Multiple food allergies   . Seizures ([place])      last seizure per pt in Jan  . Shingles < 2010    Past Surgical History:  Procedure Laterality Date  . AV FISTULA PLACEMENT Left 03/03/2015   Procedure: Creation of Left Arm RADIOCEPHALIC ARTERIOVENOUS FISTULA ;  Surgeon: Mal Misty, MD;  Location: [facility];  Service: Vascular;  Laterality: Left;  . COLONOSCOPY N/A 03/18/2016   Procedure: COLONOSCOPY;  Surgeon: Manus Gunning, MD;  Location: [place];  Service: Gastroenterology;  Laterality: N/A;  . FEMUR FRACTURE SURGERY Right 09/2001  . FRACTURE SURGERY    . I&D EXTREMITY Right 12/18/2014   Procedure: IRRIGATION AND DEBRIDEMENT RIGHT LOWER EXTREMITY;  Surgeon: Ralene Ok, MD;  Location: [facility];  Service: General;  Laterality: Right;  . INSERTION OF DIALYSIS CATHETER  2016  . LAPAROTOMY N/A 12/28/2015   Procedure: EXPLORATORY LAPAROTOMY BOWEL RESECTION ILEOCECECTOMY;  Surgeon: Clovis Riley, MD;  Location: [facility];  Service: General;  Laterality: N/A;    Family History  Problem Relation Age of Onset  . Hypertension Mother   . Diabetes Maternal Grandmother   . Kidney disease Maternal Grandmother       Social History   Socioeconomic History  . Marital status: Single    Spouse name: Not on file  . Number of children:  Not on file  . Years of education: Not on file  . Highest education level: Not on file  Occupational History  . Not on file  Social Needs  . Financial resource strain: Not on file  . Food insecurity:    Worry: Not on file    Inability: Not on file  . Transportation needs:    Medical: Not on file    Non-medical: Not on file  Tobacco Use  . Smoking status: Never Smoker  . Smokeless tobacco: Never Used  Substance and Sexual Activity  . Alcohol use: No    Comment: I stopped drinking along time ago "  . Drug use: No  . Sexual activity: Not Currently    Partners: Female    Birth control/protection: Condom  Lifestyle  . Physical activity:    Days per week: Not on file    Minutes per  session: Not on file  . Stress: Not on file  Relationships  . Social connections:    Talks on phone: Not on file    Gets together: Not on file    Attends religious service: Not on file    Active member of club or organization: Not on file    Attends meetings of clubs or organizations: Not on file    Relationship status: Not on file  Other Topics Concern  . Not on file  Social History Narrative  . Not on file    Allergies  Allergen Reactions  . Bactrim [Sulfamethoxazole-Trimethoprim] Other (See Comments)    Renal failure  . Onion Hives and Other (See Comments)    Per allergy test  . Procardia [Nifedipine] Hives, Shortness Of Breath and Itching  . Reglan [Metoclopramide] Other (See Comments)    Causes ticks/ jerks  . Vancomycin Other (See Comments)    Patient states vancomycin caused kidney injury  . Metoprolol Hives  . Amlodipine Palpitations and Other (See Comments)    Causes fatigue  . Augmentin [Amoxicillin-Pot Clavulanate] Hives     Current Outpatient Medications:  .  acetaminophen (TYLENOL) 500 MG tablet, Take 1,000 mg by mouth every 6 (six) hours as needed (pain)., Disp: , Rfl:  .  albuterol (PROVENTIL HFA;VENTOLIN HFA) 108 (90 Base) MCG/ACT inhaler, Inhale 2 puffs into the lungs every 6 (six) hours as needed for wheezing or shortness of breath., Disp: 1 Inhaler, Rfl: 3 .  bictegravir-emtricitabine-tenofovir AF (BIKTARVY) 50-200-25 MG TABS tablet, Take 1 tablet by mouth daily., Disp: 30 tablet, Rfl: 11 .  cloNIDine (CATAPRES - DOSED IN MG/24 HR) 0.3 mg/24hr patch, Place 1 patch (0.3 mg total) onto the skin every Sunday. (Patient not taking: Reported on 04/26/2017), Disp: 4 patch, Rfl: 12 .  dapsone 100 MG tablet, Take 1 tablet (100 mg total) by mouth daily., Disp: 30 tablet, Rfl: 11 .  hydrALAZINE (APRESOLINE) 25 MG tablet, TAKE 1 TABLET BY MOUTH THREE TIMES A DAY AS DIRECTED, Disp: , Rfl: 0 .  imiquimod (ALDARA) 5 % cream, Apply topically 3 (three) times a week. Apply a  thin layer prior to bedtime 3x week and leave on for 6-10 hrs. Wash off with soap and water., Disp: 24 each, Rfl: 0 .  isosorbide mononitrate (IMDUR) 60 MG 24 hr tablet, Take 1 tablet (60 mg total) by mouth daily. (Patient not taking: Reported on 04/26/2017), Disp: 30 tablet, Rfl: 0 .  losartan (COZAAR) 100 MG tablet, Take 1 tablet (100 mg total) by mouth daily., Disp: 90 tablet, Rfl: 3   Review of Systems  Constitutional: Negative  for chills and fever.  HENT: Negative for congestion and sore throat.   Eyes: Negative for photophobia.  Respiratory: Negative for cough, shortness of breath and wheezing.   Cardiovascular: Negative for chest pain, palpitations and leg swelling.  Gastrointestinal: Positive for nausea. Negative for abdominal pain, blood in stool, constipation, diarrhea and vomiting.  Genitourinary: Negative for dysuria, flank pain and hematuria.  Musculoskeletal: Negative for back pain and myalgias.  Neurological: Negative for dizziness and weakness.  Hematological: Does not bruise/bleed easily.  Psychiatric/Behavioral: Negative for suicidal ideas.       Objective:   Physical Exam  Constitutional: He is oriented to person, place, and time. No distress.  HENT:  Head: Normocephalic and atraumatic.  Mouth/Throat: No oropharyngeal exudate.  Eyes: Conjunctivae and EOM are normal. No scleral icterus.  Neck: Normal range of motion. Neck supple.  Cardiovascular: Normal rate and regular rhythm.  Pulmonary/Chest: Effort normal. No respiratory distress. He has no wheezes.  Abdominal: He exhibits no distension.  Musculoskeletal: He exhibits no edema or tenderness.  Neurological: He is alert and oriented to person, place, and time. Coordination normal.  Skin: Skin is warm and dry. No rash noted. He is not diaphoretic. No erythema. No pallor.  Psychiatric: He has a normal mood and affect. His behavior is normal. Judgment and thought content normal.  Nursing note and vitals  reviewed.         Assessment & Plan:   HIV/AIDS: Continue BIKTARVY and dapsone for pCP prevention. He was interested in role of TNF from TAF and we had extensive discussions re this. Esp if he has renal transplantation. 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

## 2017-12-03 ENCOUNTER — Other Ambulatory Visit: Payer: Self-pay | Admitting: Infectious Disease

## 2017-12-25 ENCOUNTER — Other Ambulatory Visit: Payer: Self-pay

## 2017-12-25 DIAGNOSIS — Z23 Encounter for immunization: Secondary | ICD-10-CM

## 2017-12-25 DIAGNOSIS — Z113 Encounter for screening for infections with a predominantly sexual mode of transmission: Secondary | ICD-10-CM

## 2017-12-25 DIAGNOSIS — Z79899 Other long term (current) drug therapy: Secondary | ICD-10-CM

## 2017-12-25 DIAGNOSIS — B20 Human immunodeficiency virus [HIV] disease: Secondary | ICD-10-CM

## 2017-12-26 LAB — T-HELPER CELL (CD4) - (RCID CLINIC ONLY)
CD4 % Helper T Cell: 23 % — ABNORMAL LOW (ref 33–55)
CD4 T Cell Abs: 130 /uL — ABNORMAL LOW (ref 400–2700)

## 2017-12-27 LAB — CBC WITH DIFFERENTIAL/PLATELET
Basophils Absolute: 41 cells/uL (ref 0–200)
Basophils Relative: 0.9 %
Eosinophils Absolute: 234 cells/uL (ref 15–500)
Eosinophils Relative: 5.2 %
HCT: 34.8 % — ABNORMAL LOW (ref 38.5–50.0)
Hemoglobin: 11.4 g/dL — ABNORMAL LOW (ref 13.2–17.1)
Lymphs Abs: 545 cells/uL — ABNORMAL LOW (ref 850–3900)
MCH: 29.3 pg (ref 27.0–33.0)
MCHC: 32.8 g/dL (ref 32.0–36.0)
MCV: 89.5 fL (ref 80.0–100.0)
MPV: 11 fL (ref 7.5–12.5)
Monocytes Relative: 6.1 %
Neutro Abs: 3407 cells/uL (ref 1500–7800)
Neutrophils Relative %: 75.7 %
Platelets: 160 10*3/uL (ref 140–400)
RBC: 3.89 10*6/uL — ABNORMAL LOW (ref 4.20–5.80)
RDW: 15.7 % — ABNORMAL HIGH (ref 11.0–15.0)
Total Lymphocyte: 12.1 %
WBC mixed population: 275 cells/uL (ref 200–950)
WBC: 4.5 10*3/uL (ref 3.8–10.8)

## 2017-12-27 LAB — LIPID PANEL
Cholesterol: 128 mg/dL (ref <200)
HDL: 51 mg/dL (ref >40)
LDL Cholesterol (Calc): 56 mg/dL (calc)
Non-HDL Cholesterol (Calc): 77 mg/dL (calc) (ref <130)
Total CHOL/HDL Ratio: 2.5 (calc) (ref <5.0)
Triglycerides: 128 mg/dL (ref <150)

## 2017-12-27 LAB — HIV-1 RNA QUANT-NO REFLEX-BLD
HIV 1 RNA Quant: 29 copies/mL — ABNORMAL HIGH
HIV-1 RNA Quant, Log: 1.46 Log copies/mL — ABNORMAL HIGH

## 2017-12-27 LAB — RPR: RPR Ser Ql: NONREACTIVE

## 2018-01-17 DIAGNOSIS — E875 Hyperkalemia: Secondary | ICD-10-CM | POA: Diagnosis not present

## 2018-01-17 DIAGNOSIS — J019 Acute sinusitis, unspecified: Secondary | ICD-10-CM | POA: Diagnosis not present

## 2018-01-29 ENCOUNTER — Encounter: Payer: Self-pay | Admitting: Infectious Disease

## 2018-01-29 DIAGNOSIS — Z992 Dependence on renal dialysis: Secondary | ICD-10-CM | POA: Diagnosis not present

## 2018-01-29 DIAGNOSIS — N186 End stage renal disease: Secondary | ICD-10-CM | POA: Diagnosis not present

## 2018-01-29 DIAGNOSIS — J320 Chronic maxillary sinusitis: Secondary | ICD-10-CM | POA: Diagnosis present

## 2018-01-29 DIAGNOSIS — B20 Human immunodeficiency virus [HIV] disease: Secondary | ICD-10-CM | POA: Diagnosis not present

## 2018-01-29 DIAGNOSIS — Z21 Asymptomatic human immunodeficiency virus [HIV] infection status: Secondary | ICD-10-CM | POA: Diagnosis present

## 2018-01-29 DIAGNOSIS — Z87891 Personal history of nicotine dependence: Secondary | ICD-10-CM | POA: Diagnosis not present

## 2018-01-29 DIAGNOSIS — I160 Hypertensive urgency: Secondary | ICD-10-CM | POA: Diagnosis present

## 2018-01-29 DIAGNOSIS — D631 Anemia in chronic kidney disease: Secondary | ICD-10-CM | POA: Diagnosis not present

## 2018-01-29 DIAGNOSIS — I10 Essential (primary) hypertension: Secondary | ICD-10-CM | POA: Diagnosis not present

## 2018-01-29 DIAGNOSIS — I120 Hypertensive chronic kidney disease with stage 5 chronic kidney disease or end stage renal disease: Secondary | ICD-10-CM | POA: Diagnosis not present

## 2018-01-29 DIAGNOSIS — R51 Headache: Secondary | ICD-10-CM | POA: Diagnosis not present

## 2018-01-29 DIAGNOSIS — Z8249 Family history of ischemic heart disease and other diseases of the circulatory system: Secondary | ICD-10-CM | POA: Diagnosis not present

## 2018-01-29 DIAGNOSIS — Z79899 Other long term (current) drug therapy: Secondary | ICD-10-CM | POA: Diagnosis not present

## 2018-01-29 DIAGNOSIS — G40909 Epilepsy, unspecified, not intractable, without status epilepticus: Secondary | ICD-10-CM | POA: Diagnosis not present

## 2018-01-29 DIAGNOSIS — J45909 Unspecified asthma, uncomplicated: Secondary | ICD-10-CM | POA: Diagnosis present

## 2018-02-11 ENCOUNTER — Telehealth: Payer: Self-pay | Admitting: Behavioral Health

## 2018-02-11 NOTE — Telephone Encounter (Signed)
Patient called stating he has been using Aldara cream as prescribed and he has not seen any results.  Patient has an upcoming appointment with Dr. Tommy Medal and advised patient to discuss this with him at his next visit.  Patient verbalized understanding. Pricilla Riffle RN

## 2018-02-11 NOTE — Telephone Encounter (Signed)
He likely needs to see Dr. Marcello Moores with General Surgery ensured otherwise would refer him to [facility]

## 2018-03-08 ENCOUNTER — Encounter: Payer: Self-pay | Admitting: Infectious Disease

## 2018-03-08 ENCOUNTER — Ambulatory Visit (INDEPENDENT_AMBULATORY_CARE_PROVIDER_SITE_OTHER): Payer: Medicaid Other | Admitting: Infectious Disease

## 2018-03-08 VITALS — BP 173/100 | HR 108 | Temp 99.3°F | Wt 177.0 lb

## 2018-03-08 DIAGNOSIS — N186 End stage renal disease: Secondary | ICD-10-CM

## 2018-03-08 DIAGNOSIS — B59 Pneumocystosis: Secondary | ICD-10-CM

## 2018-03-08 DIAGNOSIS — N032 Chronic nephritic syndrome with diffuse membranous glomerulonephritis: Secondary | ICD-10-CM

## 2018-03-08 DIAGNOSIS — I120 Hypertensive chronic kidney disease with stage 5 chronic kidney disease or end stage renal disease: Secondary | ICD-10-CM | POA: Diagnosis not present

## 2018-03-08 DIAGNOSIS — Z23 Encounter for immunization: Secondary | ICD-10-CM

## 2018-03-08 DIAGNOSIS — Z992 Dependence on renal dialysis: Secondary | ICD-10-CM

## 2018-03-08 DIAGNOSIS — N08 Glomerular disorders in diseases classified elsewhere: Secondary | ICD-10-CM

## 2018-03-08 DIAGNOSIS — A630 Anogenital (venereal) warts: Secondary | ICD-10-CM | POA: Diagnosis not present

## 2018-03-08 DIAGNOSIS — Z79899 Other long term (current) drug therapy: Secondary | ICD-10-CM

## 2018-03-08 DIAGNOSIS — B20 Human immunodeficiency virus [HIV] disease: Secondary | ICD-10-CM

## 2018-03-08 DIAGNOSIS — R05 Cough: Secondary | ICD-10-CM

## 2018-03-08 HISTORY — DX: Anogenital (venereal) warts: A63.0

## 2018-03-08 LAB — T-HELPER CELL (CD4) - (RCID CLINIC ONLY)
CD4 % Helper T Cell: 23 % — ABNORMAL LOW (ref 33–55)
CD4 T Cell Abs: 130 /uL — ABNORMAL LOW (ref 400–2700)

## 2018-03-08 MED ORDER — BICTEGRAVIR-EMTRICITAB-TENOFOV 50-200-25 MG PO TABS: 1.0000 | ORAL_TABLET | Freq: Every day | ORAL | 11 refills | Status: DC

## 2018-03-08 MED ORDER — DAPSONE 100 MG PO TABS: 100.0000 mg | ORAL_TABLET | Freq: Every day | ORAL | 11 refills | Status: DC

## 2018-03-08 NOTE — Progress Notes (Signed)
Subjective:    Chief complaint: followup for HIV with also now productive cough with phlegm   Patient ID: Jim Wyatt, male    DOB: [birth year], 38 y.o.   MRN: [id number]  HPI  38y.o. 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  He has been treated with > 21 days of clinda, primaquin and prednisone (was still taking prednisone and clindamycin with him).  We simplified his prior regimen to Tivicay and Descovy and he dropped his VL nicely from over 150K to 149 copies and downwards and more recently to 25 in July. We have swtiched him to [facility]. 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  He is now on dialysis and has both Medicare and Medicaid.  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  He is also quite happy to have it changed over to [employer] now.  He has noticed worsened cough of the last 3 days with sputum production but no fevers chills malaise nausea or other systemic symptoms.  He is going to dialysis regularly  Bothered by his persistent genital warts that have not responded to Aldara cream.   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  He has sufficient BIKTARVY for now though is going to purchase insurance through work.    Past Medical History:  Diagnosis Date  . Asthma   . CAP (community acquired pneumonia) 10/2013  . Cellulitis 11/2013   LLE  . DVT (deep venous thrombosis) ([place]) 10/2013   BLE  . ESRD on hemodialysis ([place]) 01/2015   AKI with nephrotic  syndrome (16gm proteinuria at time of biopsy, from collapsing FSG). 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  Went on HD Dec 2016.   [name] Fever   . Headache   . HIV infection ([place]) dx'd 10/2013  . Hypertension   . Leg pain   . Multiple environmental allergies    "trees, dogs, cats"  . Multiple food allergies   . Need for prophylactic vaccination against Streptococcus pneumoniae (pneumococcus) 08/28/2017  . Seizures ([place])    last seizure per pt in Jan  . Shingles < 2010    Past Surgical History:  Procedure Laterality Date  . AV FISTULA PLACEMENT Left 03/03/2015   Procedure: Creation of Left Arm RADIOCEPHALIC ARTERIOVENOUS FISTULA ;  Surgeon: Mal Misty, MD;  Location: [facility];  Service: Vascular;  Laterality: Left;  . COLONOSCOPY N/A 03/18/2016   Procedure: COLONOSCOPY;  Surgeon: Manus Gunning, MD;  Location: [facility];  Service: Gastroenterology;  Laterality: N/A;  . FEMUR FRACTURE SURGERY Right 09/2001  . FRACTURE SURGERY    . I&D EXTREMITY Right 12/18/2014   Procedure: IRRIGATION AND DEBRIDEMENT RIGHT LOWER EXTREMITY;  Surgeon: Ralene Ok, MD;  Location: [facility];  Service: General;  Laterality: Right;  . INSERTION OF DIALYSIS CATHETER  2016  . LAPAROTOMY N/A 12/28/2015   Procedure: EXPLORATORY LAPAROTOMY BOWEL  RESECTION ILEOCECECTOMY;  Surgeon: Clovis Riley, MD;  Location: [facility];  Service: General;  Laterality: N/A;    Family History  Problem Relation Age of Onset  . Hypertension Mother   . Diabetes Maternal Grandmother   . Kidney disease Maternal Grandmother       Social History   Socioeconomic History  . Marital status: Single    Spouse name: Not on file  . Number of children: Not on file  . Years of education: Not on file  . Highest education level: Not on file  Occupational History  . Not on file  Social Needs  . Financial resource strain: Not on file  . Food insecurity:    Worry: Not on file     Inability: Not on file  . Transportation needs:    Medical: Not on file    Non-medical: Not on file  Tobacco Use  . Smoking status: Never Smoker  . Smokeless tobacco: Never Used  Substance and Sexual Activity  . Alcohol use: No    Comment: I stopped drinking along time ago "  . Drug use: No  . Sexual activity: Not Currently    Partners: Female    Birth control/protection: Condom  Lifestyle  . Physical activity:    Days per week: Not on file    Minutes per session: Not on file  . Stress: Not on file  Relationships  . Social connections:    Talks on phone: Not on file    Gets together: Not on file    Attends religious service: Not on file    Active member of club or organization: Not on file    Attends meetings of clubs or organizations: Not on file    Relationship status: Not on file  Other Topics Concern  . Not on file  Social History Narrative  . Not on file    Allergies  Allergen Reactions  . Bactrim [Sulfamethoxazole-Trimethoprim] Other (See Comments)    Renal failure  . Onion Hives and Other (See Comments)    Per allergy test  . Procardia [Nifedipine] Hives, Shortness Of Breath and Itching  . Reglan [Metoclopramide] Other (See Comments)    Causes ticks/ jerks  . Vancomycin Other (See Comments)    Patient states vancomycin caused kidney injury  . Metoprolol Hives  . Amlodipine Palpitations and Other (See Comments)    Causes fatigue  . Augmentin [Amoxicillin-Pot Clavulanate] Hives     Current Outpatient Medications:  .  acetaminophen (TYLENOL) 500 MG tablet, Take 1,000 mg by mouth every 6 (six) hours as needed (pain)., Disp: , Rfl:  .  albuterol (PROVENTIL HFA;VENTOLIN HFA) 108 (90 Base) MCG/ACT inhaler, Inhale 2 puffs into the lungs every 6 (six) hours as needed for wheezing or shortness of breath., Disp: 1 Inhaler, Rfl: 3 .  bictegravir-emtricitabine-tenofovir AF (BIKTARVY) 50-200-25 MG TABS tablet, Take 1 tablet by mouth daily., Disp: 30 tablet, Rfl: 11 .   carvedilol (COREG) 25 MG tablet, Take 2 tablet by mouth twice a day, Disp: , Rfl:  .  dapsone 100 MG tablet, Take 1 tablet (100 mg total) by mouth daily., Disp: 30 tablet, Rfl: 11 .  hydrALAZINE (APRESOLINE) 25 MG tablet, TAKE 1 TABLET BY MOUTH THREE TIMES A DAY AS DIRECTED, Disp: , Rfl: 0 .  imiquimod (ALDARA) 5 % cream, Apply a thin layer prior TO bedtime 3x week AND LEAVE ON FOR 6-10 HOURS. Wash off with soap AND WATER.], Disp: 24 each, Rfl: 0 .  isosorbide mononitrate (IMDUR)  60 MG 24 hr tablet, Take 1 tablet (60 mg total) by mouth daily. (Patient not taking: Reported on 04/26/2017), Disp: 30 tablet, Rfl: 0 .  losartan (COZAAR) 100 MG tablet, Take 1 tablet (100 mg total) by mouth daily., Disp: 90 tablet, Rfl: 3   Review of Systems  Constitutional: Negative for chills, fatigue, fever and unexpected weight change.  HENT: Negative for congestion and sore throat.   Eyes: Negative for photophobia.  Respiratory: Positive for cough. Negative for chest tightness, shortness of breath and wheezing.   Cardiovascular: Negative for chest pain, palpitations and leg swelling.  Gastrointestinal: Negative for abdominal pain, blood in stool, constipation, diarrhea and vomiting.  Genitourinary: Negative for dysuria, flank pain and hematuria.  Musculoskeletal: Negative for back pain and myalgias.  Neurological: Negative for dizziness and weakness.  Hematological: Does not bruise/bleed easily.  Psychiatric/Behavioral: Negative for suicidal ideas.   Genital warts seen on penis March 08, 2018:         Objective:   Physical Exam  Constitutional: He is oriented to person, place, and time. No distress.  HENT:  Head: Normocephalic and atraumatic.  Mouth/Throat: No oropharyngeal exudate.  Eyes: Conjunctivae and EOM are normal. No scleral icterus.  Neck: Normal range of motion. Neck supple.  Cardiovascular: Normal rate, regular rhythm and normal heart sounds. Exam reveals no gallop.  No murmur  heard. Pulmonary/Chest: Effort normal and breath sounds normal. No respiratory distress. He has no wheezes. He has no rales. He exhibits no tenderness.  Abdominal: Soft. He exhibits no distension.  Genitourinary:     Musculoskeletal:        General: No tenderness or edema.  Neurological: He is alert and oriented to person, place, and time. Coordination normal.  Skin: Skin is warm and dry. No rash noted. He is not diaphoretic. No erythema. No pallor.  Psychiatric: He has a normal mood and affect. His behavior is normal. Judgment and thought content normal.  Nursing note and vitals reviewed.         Assessment & Plan:   HIV/AIDS: Continue BIKTARVY and dapsone for pCP prevention. 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

## 2018-03-15 LAB — CBC WITH DIFFERENTIAL/PLATELET
Absolute Monocytes: 560 cells/uL (ref 200–950)
Basophils Absolute: 42 cells/uL (ref 0–200)
Basophils Relative: 0.6 %
Eosinophils Absolute: 203 cells/uL (ref 15–500)
Eosinophils Relative: 2.9 %
HCT: 30.7 % — ABNORMAL LOW (ref 38.5–50.0)
Hemoglobin: 10.1 g/dL — ABNORMAL LOW (ref 13.2–17.1)
Lymphs Abs: 609 cells/uL — ABNORMAL LOW (ref 850–3900)
MCH: 28.8 pg (ref 27.0–33.0)
MCHC: 32.9 g/dL (ref 32.0–36.0)
MCV: 87.5 fL (ref 80.0–100.0)
MPV: 9.8 fL (ref 7.5–12.5)
Monocytes Relative: 8 %
Neutro Abs: 5586 cells/uL (ref 1500–7800)
Neutrophils Relative %: 79.8 %
Platelets: 190 10*3/uL (ref 140–400)
RBC: 3.51 10*6/uL — ABNORMAL LOW (ref 4.20–5.80)
RDW: 16 % — ABNORMAL HIGH (ref 11.0–15.0)
Total Lymphocyte: 8.7 %
WBC: 7 10*3/uL (ref 3.8–10.8)

## 2018-03-15 LAB — HIV RNA, RTPCR W/R GT (RTI, PI,INT)
HIV 1 RNA Quant: <20 copies/mL
HIV-1 RNA Quant, Log: <1.3 Log copies/mL

## 2018-03-15 LAB — LIPID PANEL
Cholesterol: 110 mg/dL (ref <200)
HDL: 50 mg/dL (ref >40)
LDL Cholesterol (Calc): 45 mg/dL (calc)
Non-HDL Cholesterol (Calc): 60 mg/dL (calc) (ref <130)
Total CHOL/HDL Ratio: 2.2 (calc) (ref <5.0)
Triglycerides: 72 mg/dL (ref <150)

## 2018-03-15 LAB — RPR: RPR Ser Ql: NONREACTIVE

## 2018-04-11 ENCOUNTER — Telehealth: Payer: Self-pay | Admitting: Pharmacy Technician

## 2018-04-11 NOTE — Telephone Encounter (Signed)
Thanks much Inez Catalina!

## 2018-04-11 NOTE — Telephone Encounter (Signed)
I reached out to Jim Wyatt to see if he is receiving his Biktarvy medication and from where.  He confirmed he is receiving it monthly from [facility].    He is not having any side effects from [name] and had no questions for the pharmacist.  [name]. Nadara Mustard [place] Patient [facility] Phone: [phone] Fax:  [phone]

## 2018-04-30 ENCOUNTER — Other Ambulatory Visit: Payer: Self-pay | Admitting: Infectious Disease

## 2018-04-30 DIAGNOSIS — B20 Human immunodeficiency virus [HIV] disease: Secondary | ICD-10-CM

## 2018-05-31 IMAGING — DX DG FEMUR 2+V*L*
4 series · 4 of 4 positions shown · non-contrast
Comparison: None.

CLINICAL DATA: Leg pain

EXAM:
LEFT FEMUR 2 VIEWS

[femur ap (1 of 2)]
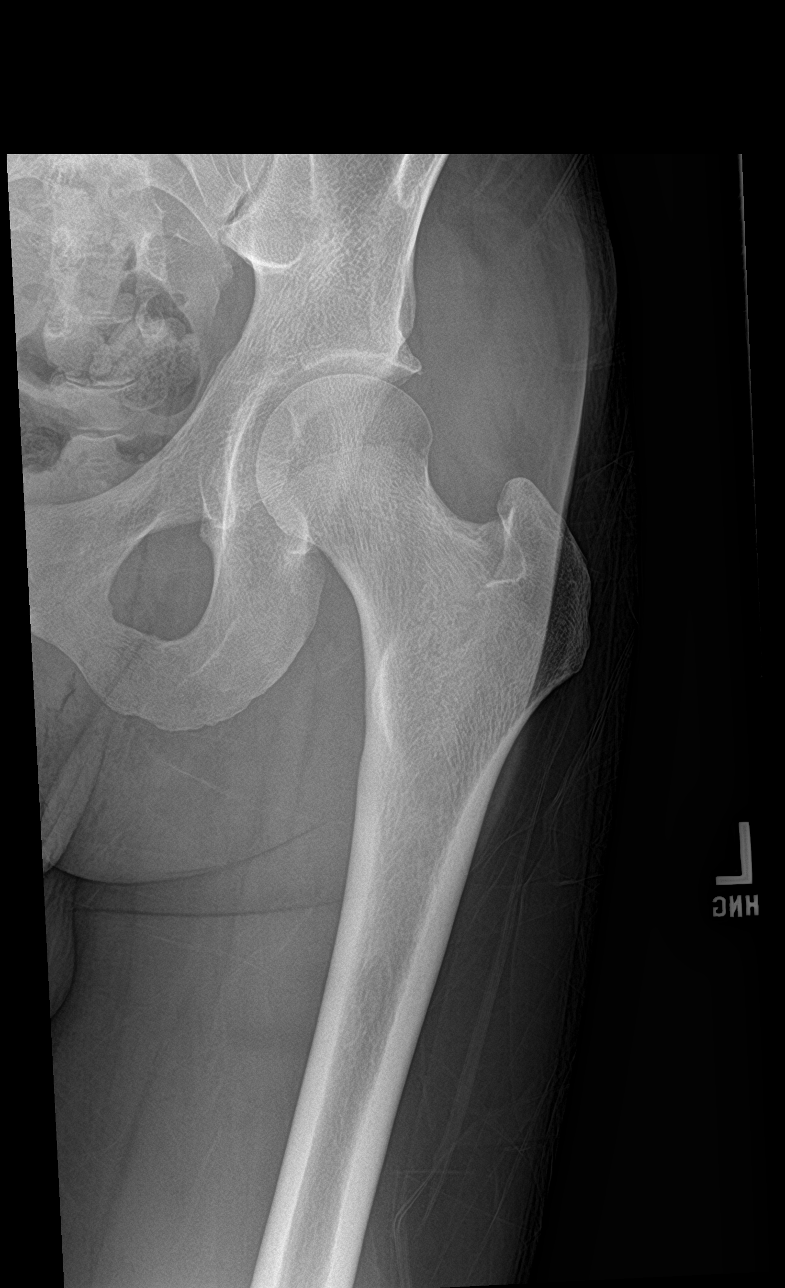

[femur ap (2 of 2)]
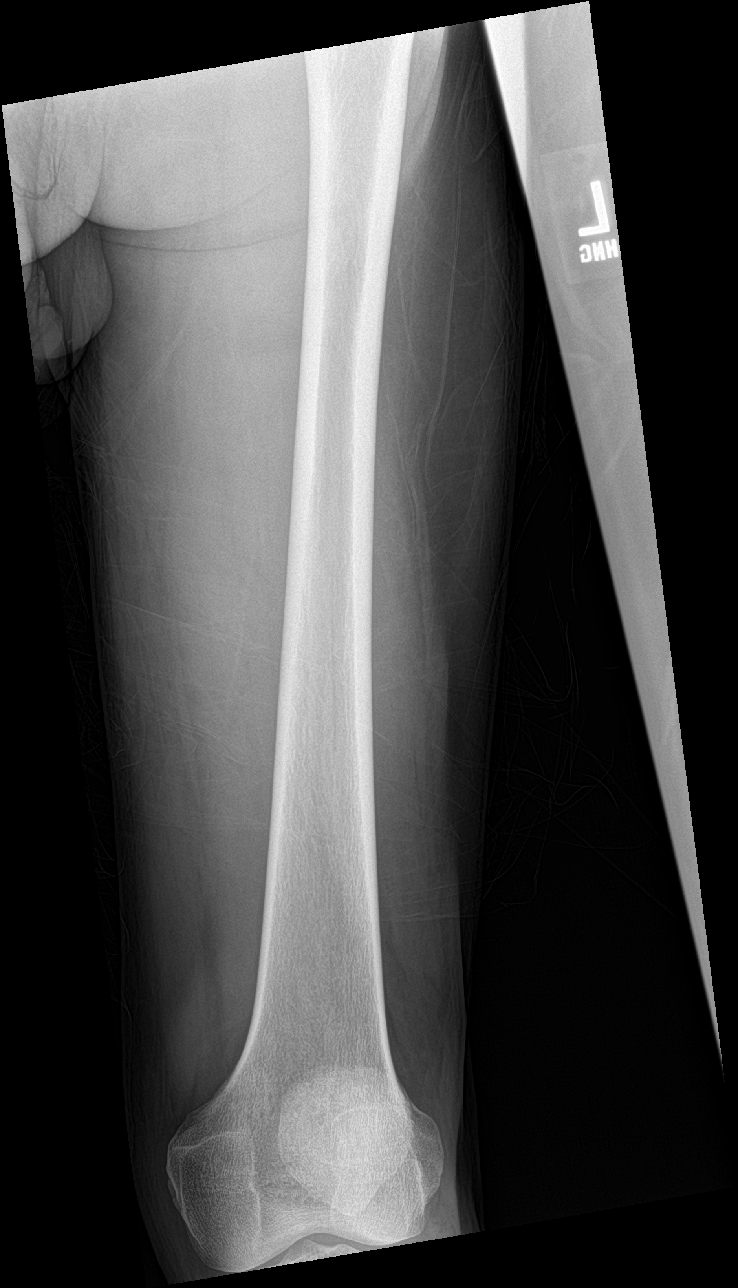

[femur lat (1 of 2)]
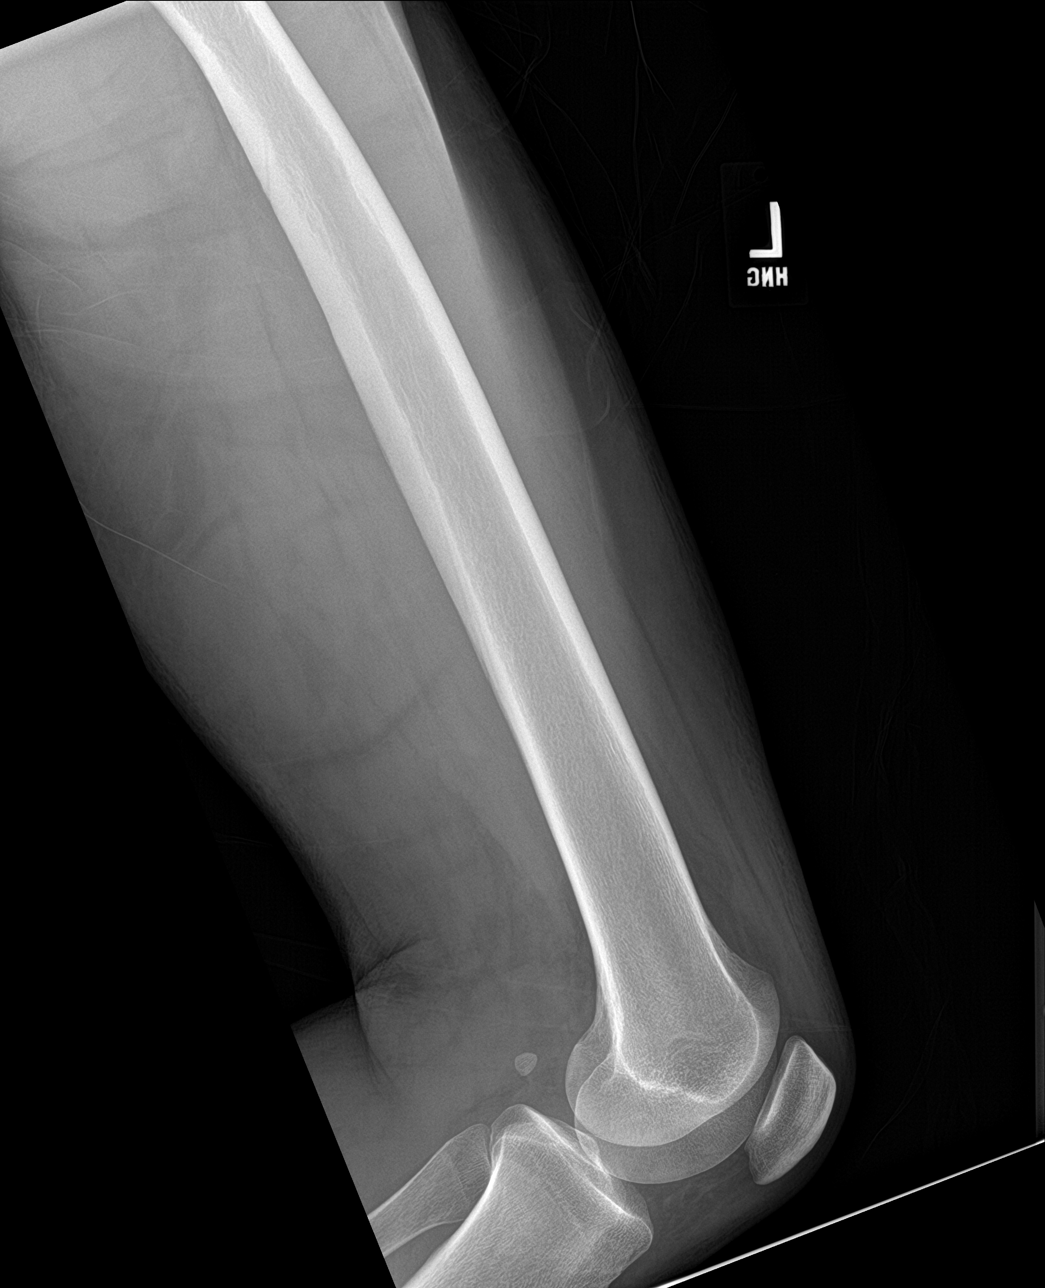

[femur lat (2 of 2)]
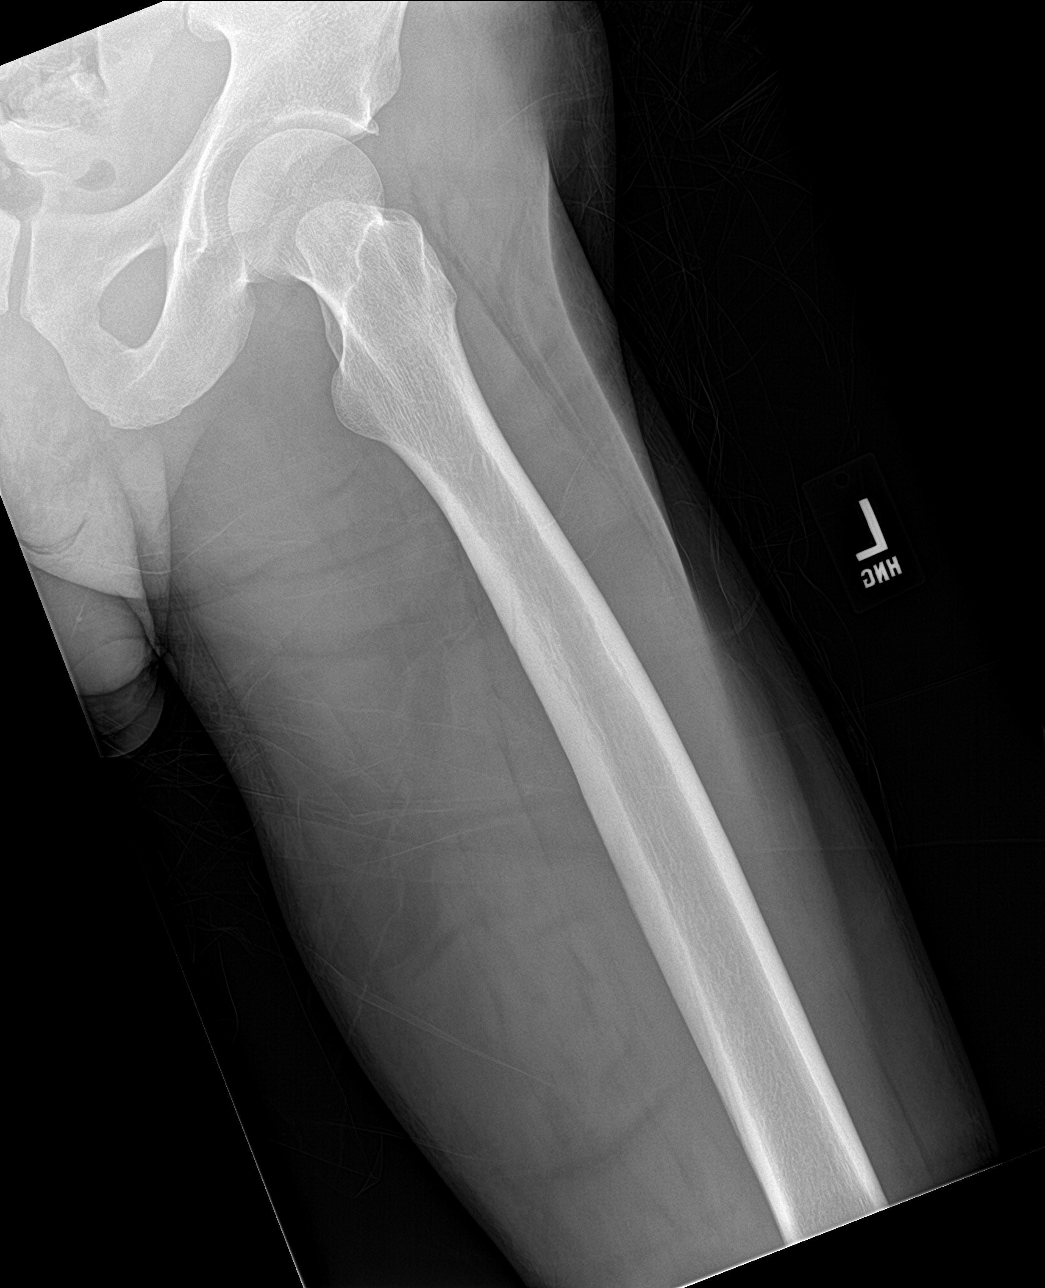

[4 of 4 positions shown; findings below may reference images not displayed]

FINDINGS: There is no evidence of fracture or other focal bone lesions. Soft
tissues are unremarkable.
IMPRESSION: Negative.

## 2018-05-31 IMAGING — DX DG TIBIA/FIBULA 2V*L*
2 series · 2 of 2 positions shown · non-contrast
Comparison: CT 12/06/2013

CLINICAL DATA: Left leg pain

EXAM:
LEFT TIBIA AND FIBULA - 2 VIEW

[tibia ap (1 of 2)]
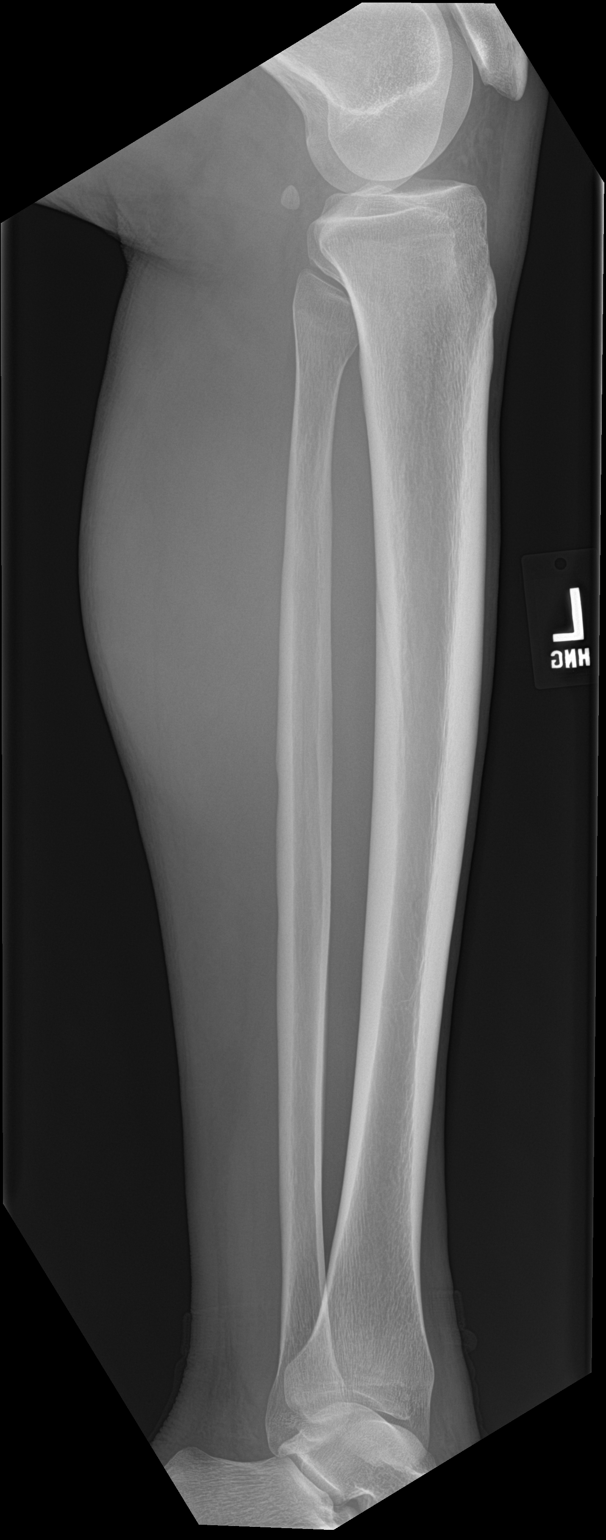

[tibia ap (2 of 2)]
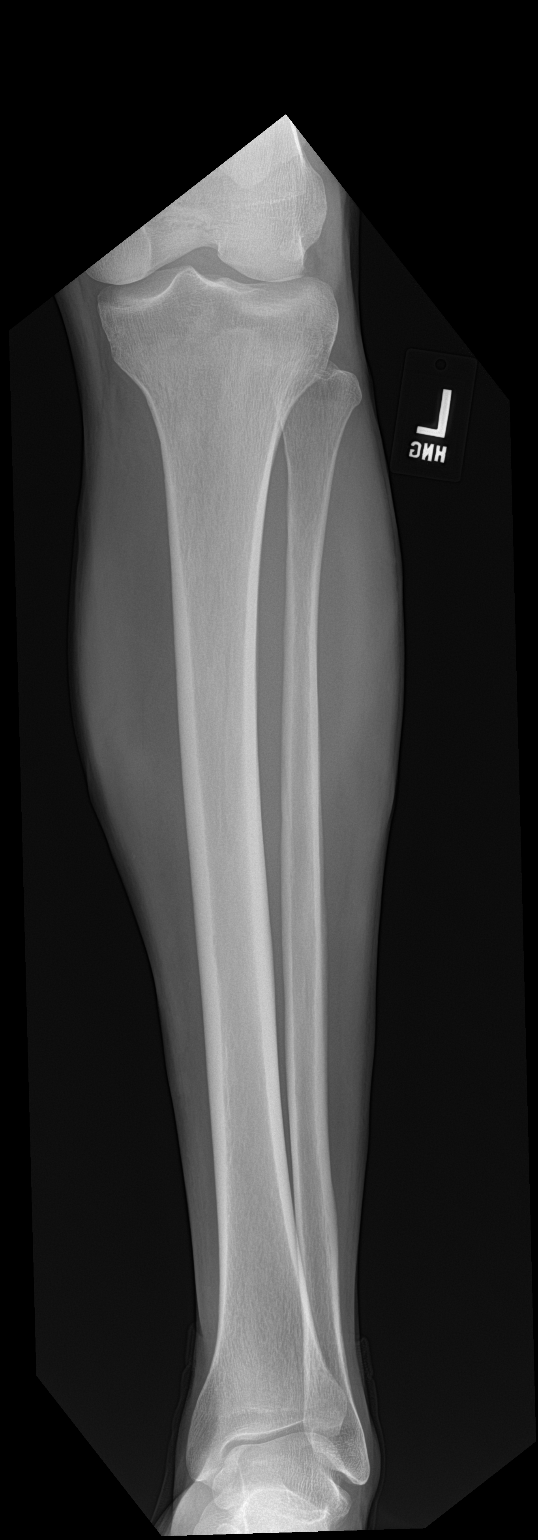

[2 of 2 positions shown; findings below may reference images not displayed]

FINDINGS: There is no evidence of fracture or other focal bone lesions. Soft
tissues are unremarkable.
IMPRESSION: Negative.

## 2018-06-08 NOTE — Telephone Encounter (Signed)
error 

## 2018-06-24 ENCOUNTER — Telehealth: Payer: Self-pay | Admitting: Infectious Disease

## 2018-06-24 NOTE — Telephone Encounter (Signed)
[id number] Pre-Screening Questions: 06/24/18  Do you currently have a fever (>100 F), chills or unexplained body aches? NO   Are you currently experiencing new cough, shortness of breath, sore throat, runny nose? NO    Have you recently travelled outside the state of [place] in the last 14 days? NO   Have you been in contact with someone that is currently pending confirmation of Covid19 testing or has been confirmed to have the [place] virus? NO  **If the patient answers NO to ALL questions -  advise the patient to please call the clinic before coming to the office should any symptoms develop.

## 2018-06-25 ENCOUNTER — Other Ambulatory Visit: Payer: Medicaid Other

## 2018-06-25 ENCOUNTER — Other Ambulatory Visit: Payer: Self-pay

## 2018-06-25 ENCOUNTER — Other Ambulatory Visit: Payer: Self-pay | Admitting: Behavioral Health

## 2018-06-25 DIAGNOSIS — B20 Human immunodeficiency virus [HIV] disease: Secondary | ICD-10-CM

## 2018-06-25 DIAGNOSIS — A630 Anogenital (venereal) warts: Secondary | ICD-10-CM

## 2018-06-25 NOTE — Progress Notes (Signed)
error 

## 2018-06-26 LAB — T-HELPER CELL (CD4) - (RCID CLINIC ONLY)
CD4 % Helper T Cell: 26 % — ABNORMAL LOW (ref 33–65)
CD4 T Cell Abs: 199 /uL — ABNORMAL LOW (ref 400–1790)

## 2018-06-30 LAB — CBC WITH DIFFERENTIAL/PLATELET
Absolute Monocytes: 338 cells/uL (ref 200–950)
Basophils Absolute: 38 cells/uL (ref 0–200)
Basophils Relative: 0.8 %
Eosinophils Absolute: 338 cells/uL (ref 15–500)
Eosinophils Relative: 7.2 %
HCT: 30.3 % — ABNORMAL LOW (ref 38.5–50.0)
Hemoglobin: 9.6 g/dL — ABNORMAL LOW (ref 13.2–17.1)
Lymphs Abs: 705 cells/uL — ABNORMAL LOW (ref 850–3900)
MCH: 24.9 pg — ABNORMAL LOW (ref 27.0–33.0)
MCHC: 31.7 g/dL — ABNORMAL LOW (ref 32.0–36.0)
MCV: 78.7 fL — ABNORMAL LOW (ref 80.0–100.0)
MPV: 9.9 fL (ref 7.5–12.5)
Monocytes Relative: 7.2 %
Neutro Abs: 3281 cells/uL (ref 1500–7800)
Neutrophils Relative %: 69.8 %
Platelets: 196 10*3/uL (ref 140–400)
RBC: 3.85 10*6/uL — ABNORMAL LOW (ref 4.20–5.80)
RDW: 16.9 % — ABNORMAL HIGH (ref 11.0–15.0)
Total Lymphocyte: 15 %
WBC: 4.7 10*3/uL (ref 3.8–10.8)

## 2018-06-30 LAB — RPR: RPR Ser Ql: NONREACTIVE

## 2018-06-30 LAB — HIV-1 RNA QUANT-NO REFLEX-BLD
HIV 1 RNA Quant: <20 copies/mL — AB
HIV-1 RNA Quant, Log: <1.3 Log copies/mL — AB

## 2018-07-08 ENCOUNTER — Other Ambulatory Visit: Payer: Self-pay | Admitting: Infectious Disease

## 2018-07-08 DIAGNOSIS — B20 Human immunodeficiency virus [HIV] disease: Secondary | ICD-10-CM

## 2018-07-10 ENCOUNTER — Ambulatory Visit (INDEPENDENT_AMBULATORY_CARE_PROVIDER_SITE_OTHER): Payer: Medicaid Other | Admitting: Infectious Disease

## 2018-07-10 ENCOUNTER — Other Ambulatory Visit: Payer: Self-pay

## 2018-07-10 DIAGNOSIS — B259 Cytomegaloviral disease, unspecified: Secondary | ICD-10-CM

## 2018-07-10 DIAGNOSIS — H3090 Unspecified chorioretinal inflammation, unspecified eye: Secondary | ICD-10-CM

## 2018-07-10 DIAGNOSIS — B20 Human immunodeficiency virus [HIV] disease: Secondary | ICD-10-CM

## 2018-07-10 DIAGNOSIS — N08 Glomerular disorders in diseases classified elsewhere: Secondary | ICD-10-CM

## 2018-07-10 DIAGNOSIS — N184 Chronic kidney disease, stage 4 (severe): Secondary | ICD-10-CM | POA: Diagnosis not present

## 2018-07-10 MED ORDER — BICTEGRAVIR-EMTRICITAB-TENOFOV 50-200-25 MG PO TABS: 1.0000 | ORAL_TABLET | Freq: Every day | ORAL | 11 refills | Status: DC

## 2018-07-10 NOTE — Progress Notes (Signed)
Virtual Visit via Telephone Note  I connected with CAROLE DEERE on 07/10/18 at  9:00 AM EDT by telephone and verified that I am speaking with the correct person using two identifiers.  Location: Patient:  Home Provider: [facility]   I discussed the limitations, risks, security and privacy concerns of performing an evaluation and management service by telephone and the availability of in person appointments. I also discussed with the patient that there may be a patient responsible charge related to this service. The patient expressed understanding and agreed to proceed.   History of Present Illness:  38 year old African-American man living with HIV, AIDS with prior PCP pneumonia.  He has comorbid chronic kidney disease requiring hemodialysis which she receives on Mondays and Wednesdays and Fridays.  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  He currently lives in [place].  He has been highly adherent to his Biktarvy and his viral load remains undetectable.  He had questions today about whether or not he and his wife could have a child and I informed him that he certainly could and that the data from multiple studies including the partner studies indicate that a patient was undetectable has a virus that is on transmissible to their sexual partners.  He is having his genital warts treated by dermatology through combinations of medication and freezing.  Otherwise he is doing quite well.  He is largely sheltering in place.  Currently lives with his mother who is in her 3s.  He has a girlfriend but has not seen her for several weeks.  She works in [employer].      Observations/Objective:  HIV disease  Chronic kidney disease  Hypertension  Genital warts  Assessment and Plan:  HIV disease continue Biktarvy have him come back in September for same day labs I have counseled him that he  should not be able to transmit this virus to his sexual partner if he wants to become a conceiving a baby provided he continues take his medications remained undetectable  Chronic kidney disease continue hemodialysis  Hypertension and surely taking his antihypertensives  Genital warts continue follow dermatology  19 prevention: Advised him to shelter in place as much as possible.  Follow Up Instructions:    I discussed the assessment and treatment plan with the patient. The patient was provided an opportunity to ask questions and all were answered. The patient agreed with the plan and demonstrated an understanding of the instructions.   The patient was advised to call back or seek an in-person evaluation if the symptoms worsen or if the condition fails to improve as anticipated.  I provided 22 minutes of non-face-to-face time during this encounter.   Alcide Evener, MD

## 2018-11-13 ENCOUNTER — Other Ambulatory Visit: Payer: Self-pay | Admitting: Infectious Disease

## 2018-11-13 DIAGNOSIS — B20 Human immunodeficiency virus [HIV] disease: Secondary | ICD-10-CM

## 2018-11-19 ENCOUNTER — Ambulatory Visit: Payer: Medicaid Other | Admitting: Infectious Disease

## 2018-11-25 ENCOUNTER — Telehealth: Payer: Self-pay

## 2018-11-25 NOTE — Telephone Encounter (Signed)
COVID-19 Pre-Screening Questions:11/25/18  Do you currently have a fever (>100 F), chills or unexplained body aches?NO  Are you currently experiencing new cough, shortness of breath, sore throat, runny nose?NO .  Have you recently travelled outside the state of [place] in the last 14 days? NO .  Have you been in contact with someone that is currently pending confirmation of Covid19 testing or has been confirmed to have the [place] virus?  NO  **If the patient answers NO to ALL questions -  advise the patient to please call the clinic before coming to the office should any symptoms develop.

## 2018-11-26 ENCOUNTER — Other Ambulatory Visit: Payer: Self-pay

## 2018-11-26 ENCOUNTER — Ambulatory Visit (INDEPENDENT_AMBULATORY_CARE_PROVIDER_SITE_OTHER): Payer: Medicaid Other | Admitting: Infectious Disease

## 2018-11-26 ENCOUNTER — Encounter: Payer: Self-pay | Admitting: Infectious Disease

## 2018-11-26 VITALS — BP 116/66 | HR 137 | Temp 98.5°F

## 2018-11-26 DIAGNOSIS — N186 End stage renal disease: Secondary | ICD-10-CM

## 2018-11-26 DIAGNOSIS — Z23 Encounter for immunization: Secondary | ICD-10-CM

## 2018-11-26 DIAGNOSIS — N08 Glomerular disorders in diseases classified elsewhere: Secondary | ICD-10-CM | POA: Diagnosis not present

## 2018-11-26 DIAGNOSIS — Z992 Dependence on renal dialysis: Secondary | ICD-10-CM

## 2018-11-26 DIAGNOSIS — B20 Human immunodeficiency virus [HIV] disease: Secondary | ICD-10-CM | POA: Diagnosis not present

## 2018-11-26 MED ORDER — BIKTARVY 50-200-25 MG PO TABS: 1.0000 | ORAL_TABLET | Freq: Every day | ORAL | 5 refills | Status: DC

## 2018-11-26 MED ORDER — DAPSONE 100 MG PO TABS: 100.0000 mg | ORAL_TABLET | Freq: Every day | ORAL | 11 refills | Status: DC

## 2018-11-26 NOTE — Addendum Note (Signed)
Addended by: Lenore Cordia on: 11/26/2018 11:43 AM   Modules accepted: Orders

## 2018-11-26 NOTE — Progress Notes (Signed)
Subjective:    Chief complaint: followup for HIV disease  Patient ID: Jim Wyatt, male    DOB: 1980-10-04, 38 y.o.   MRN: [id number]  HPI  38y.o. 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  We had treated him with TIVICAY and DESCOVY and then changed over to [facility].  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  He has remained suppressed on 2 fully active drugs in the form of bictegravir and tenofovir alafenamide.  He is continuing on hemodialysis at present and has dialysis on Monday Wednesday Friday.  His antiretrovirals are mailed to him through [facility] in [place]    Past Medical History:  Diagnosis Date  . Asthma   . CAP (community acquired pneumonia) 10/2013  . Cellulitis 11/2013   LLE  . DVT (deep venous thrombosis) ([place]) 10/2013   BLE  . ESRD on hemodialysis ([place]) 01/2015   AKI with nephrotic syndrome (16gm proteinuria at time of biopsy, from collapsing FSG). 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  Went on HD Dec 2016.   [name] Fever   . Genital warts 03/08/2018  . Headache   . HIV infection ([place]) dx'd 10/2013  . Hypertension   . Leg pain   . Multiple environmental allergies    "trees, dogs, cats"  . Multiple food allergies   . Need for prophylactic vaccination against Streptococcus pneumoniae (pneumococcus) 08/28/2017  . Seizures ([place])    last seizure per pt in Jan  . Shingles < 2010    Past Surgical History:  Procedure Laterality Date  . AV FISTULA PLACEMENT Left 03/03/2015   Procedure: Creation of Left Arm RADIOCEPHALIC ARTERIOVENOUS FISTULA ;  Surgeon: Mal Misty, MD;  Location: [place];  Service: Vascular;  Laterality: Left;  . COLONOSCOPY N/A 03/18/2016   Procedure: COLONOSCOPY;  Surgeon: Manus Gunning, MD;  Location: [place];  Service: Gastroenterology;  Laterality: N/A;  . FEMUR FRACTURE SURGERY Right 09/2001  . FRACTURE SURGERY    . I&D EXTREMITY Right 12/18/2014   Procedure: IRRIGATION AND DEBRIDEMENT RIGHT LOWER EXTREMITY;  Surgeon: Ralene Ok, MD;  Location: [place];  Service: General;  Laterality: Right;  . INSERTION OF DIALYSIS CATHETER  2016  . LAPAROTOMY N/A 12/28/2015   Procedure: EXPLORATORY LAPAROTOMY BOWEL RESECTION ILEOCECECTOMY;  Surgeon: Clovis Riley, MD;  Location: [place];  Service: General;  Laterality: N/A;    Family History  Problem Relation Age of Onset  . Hypertension Mother   . Diabetes Maternal Grandmother   . Kidney disease Maternal Grandmother       Social History   Socioeconomic History  . Marital status: Single    Spouse name: Not on file  . Number of children: Not on file  . Years of education: Not on file  . Highest education level: Not on file  Occupational History  . Not on file  Social Needs  . Financial resource strain: Not on file  . Food insecurity    Worry: Not on file    Inability: Not on file  . Transportation needs  Medical: Not on file    Non-medical: Not on file  Tobacco Use  . Smoking status: Never Smoker  . Smokeless tobacco: Never Used  Substance and Sexual Activity  . Alcohol use: No    Comment: I stopped drinking along time ago "  . Drug use: No  . Sexual activity: Not Currently    Partners: Female    Birth control/protection: Condom  Lifestyle  . Physical activity    Days per week: Not on file    Minutes per session: Not on file  . Stress: Not on file  Relationships  . Social Herbalist on phone: Not on file    Gets together: Not on file    Attends religious service: Not on file    Active member of club or organization: Not on file     Attends meetings of clubs or organizations: Not on file    Relationship status: Not on file  Other Topics Concern  . Not on file  Social History Narrative  . Not on file    Allergies  Allergen Reactions  . Bactrim [Sulfamethoxazole-Trimethoprim] Other (See Comments)    Renal failure  . Onion Hives and Other (See Comments)    Per allergy test  . Procardia [Nifedipine] Hives, Shortness Of Breath and Itching  . Reglan [Metoclopramide] Other (See Comments)    Causes ticks/ jerks  . Vancomycin Other (See Comments)    Patient states vancomycin caused kidney injury  . Metoprolol Hives  . Amlodipine Palpitations and Other (See Comments)    Causes fatigue  . Augmentin [Amoxicillin-Pot Clavulanate] Hives     Current Outpatient Medications:  .  acetaminophen (TYLENOL) 500 MG tablet, Take 1,000 mg by mouth every 6 (six) hours as needed (pain)., Disp: , Rfl:  .  albuterol (PROVENTIL HFA;VENTOLIN HFA) 108 (90 Base) MCG/ACT inhaler, Inhale 2 puffs into the lungs every 6 (six) hours as needed for wheezing or shortness of breath., Disp: 1 Inhaler, Rfl: 3 .  bictegravir-emtricitabine-tenofovir AF (BIKTARVY) 50-200-25 MG TABS tablet, Take 1 tablet by mouth daily., Disp: 30 tablet, Rfl: 5 .  carvedilol (COREG) 25 MG tablet, Take 2 tablet by mouth twice a day, Disp: , Rfl:  .  dapsone 100 MG tablet, Take 1 tablet (100 mg total) by mouth daily., Disp: 30 tablet, Rfl: 11 .  hydrALAZINE (APRESOLINE) 25 MG tablet, TAKE 1 TABLET BY MOUTH THREE TIMES A DAY AS DIRECTED, Disp: , Rfl: 0 .  imiquimod (ALDARA) 5 % cream, Apply a thin layer prior TO bedtime 3x week AND LEAVE ON FOR 6-10 HOURS. Wash off with soap AND WATER.], Disp: 24 each, Rfl: 0 .  losartan (COZAAR) 100 MG tablet, Take 1 tablet (100 mg total) by mouth daily., Disp: 90 tablet, Rfl: 3 .  minoxidil (LONITEN) 10 MG tablet, Take by mouth., Disp: , Rfl:  .  isosorbide mononitrate (IMDUR) 60 MG 24 hr tablet, Take 1 tablet (60 mg total) by mouth  daily., Disp: 30 tablet, Rfl: 0   Review of Systems  Constitutional: Negative for chills, fatigue, fever and unexpected weight change.  HENT: Negative for congestion and sore throat.   Eyes: Negative for photophobia.  Respiratory: Negative for cough, chest tightness, shortness of breath and wheezing.   Cardiovascular: Negative for chest pain, palpitations and leg swelling.  Gastrointestinal: Negative for abdominal pain, blood in stool, constipation, diarrhea and vomiting.  Genitourinary: Negative for dysuria, flank pain and hematuria.  Musculoskeletal: Negative for back pain and myalgias.  Neurological:  Negative for dizziness and weakness.  Hematological: Does not bruise/bleed easily.  Psychiatric/Behavioral: Negative for suicidal ideas.        Objective:   Physical Exam  Constitutional: He is oriented to person, place, and time. No distress.  HENT:  Head: Normocephalic and atraumatic.  Mouth/Throat: No oropharyngeal exudate.  Eyes: Conjunctivae and EOM are normal. No scleral icterus.  Neck: Normal range of motion. Neck supple.  Cardiovascular: Normal rate, regular rhythm and normal heart sounds. Exam reveals no gallop.  No murmur heard. Pulmonary/Chest: Effort normal and breath sounds normal. No respiratory distress. He has no wheezes. He has no rales. He exhibits no tenderness.  Abdominal: Soft. He exhibits no distension.  Musculoskeletal:        General: No tenderness or edema.  Neurological: He is alert and oriented to person, place, and time. Coordination normal.  Skin: Skin is warm and dry. No rash noted. He is not diaphoretic. No erythema. No pallor.  Psychiatric: He has a normal mood and affect. His behavior is normal. Judgment and thought content normal.  Nursing note and vitals reviewed.         Assessment & Plan:   HIV/AIDS: Continue BIKTARVY and dapsone for pCP prevention.  Refill sent to [facility] and I have checked labs today.  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

## 2018-11-26 NOTE — Addendum Note (Signed)
Addended by: Dolan Amen D on: 11/26/2018 12:12 PM   Modules accepted: Orders

## 2018-11-27 LAB — T-HELPER CELL (CD4) - (RCID CLINIC ONLY)
CD4 % Helper T Cell: 29 % — ABNORMAL LOW (ref 33–65)
CD4 T Cell Abs: 279 /uL — ABNORMAL LOW (ref 400–1790)

## 2018-11-29 LAB — CBC WITH DIFFERENTIAL/PLATELET
Absolute Monocytes: 414 cells/uL (ref 200–950)
Basophils Absolute: 48 cells/uL (ref 0–200)
Basophils Relative: 1.1 %
Eosinophils Absolute: 290 cells/uL (ref 15–500)
Eosinophils Relative: 6.6 %
HCT: 27.1 % — ABNORMAL LOW (ref 38.5–50.0)
Hemoglobin: 8.8 g/dL — ABNORMAL LOW (ref 13.2–17.1)
Lymphs Abs: 933 cells/uL (ref 850–3900)
MCH: 26.8 pg — ABNORMAL LOW (ref 27.0–33.0)
MCHC: 32.5 g/dL (ref 32.0–36.0)
MCV: 82.6 fL (ref 80.0–100.0)
MPV: 9.3 fL (ref 7.5–12.5)
Monocytes Relative: 9.4 %
Neutro Abs: 2715 cells/uL (ref 1500–7800)
Neutrophils Relative %: 61.7 %
Platelets: 203 10*3/uL (ref 140–400)
RBC: 3.28 10*6/uL — ABNORMAL LOW (ref 4.20–5.80)
RDW: 15.5 % — ABNORMAL HIGH (ref 11.0–15.0)
Total Lymphocyte: 21.2 %
WBC: 4.4 10*3/uL (ref 3.8–10.8)

## 2018-11-29 LAB — LIPID PANEL
Cholesterol: 121 mg/dL (ref <200)
HDL: 57 mg/dL (ref >=40)
LDL Cholesterol (Calc): 50 mg/dL (calc)
Non-HDL Cholesterol (Calc): 64 mg/dL (calc) (ref <130)
Total CHOL/HDL Ratio: 2.1 (calc) (ref <5.0)
Triglycerides: 67 mg/dL (ref <150)

## 2018-11-29 LAB — RPR: RPR Ser Ql: NONREACTIVE

## 2018-11-29 LAB — HIV-1 RNA QUANT-NO REFLEX-BLD
HIV 1 RNA Quant: <20 copies/mL
HIV-1 RNA Quant, Log: <1.3 Log copies/mL

## 2019-02-05 DIAGNOSIS — I82432 Acute embolism and thrombosis of left popliteal vein: Secondary | ICD-10-CM | POA: Insufficient documentation

## 2019-02-07 DIAGNOSIS — J4520 Mild intermittent asthma, uncomplicated: Secondary | ICD-10-CM | POA: Insufficient documentation

## 2019-03-18 DIAGNOSIS — N2581 Secondary hyperparathyroidism of renal origin: Secondary | ICD-10-CM | POA: Insufficient documentation

## 2019-04-11 DIAGNOSIS — R000 Tachycardia, unspecified: Principal | ICD-10-CM

## 2019-04-11 DIAGNOSIS — E215 Disorder of parathyroid gland, unspecified: Principal | ICD-10-CM

## 2019-04-15 DIAGNOSIS — I82512 Chronic embolism and thrombosis of left femoral vein: Secondary | ICD-10-CM | POA: Insufficient documentation

## 2019-04-22 ENCOUNTER — Ambulatory Visit: Admit: 2019-04-22 | Discharge: 2019-04-23 | Payer: MEDICARE | Attending: Otolaryngology | Primary: Otolaryngology

## 2019-04-22 DIAGNOSIS — N2581 Secondary hyperparathyroidism of renal origin: Principal | ICD-10-CM

## 2019-05-02 ENCOUNTER — Ambulatory Visit: Admit: 2019-05-02 | Discharge: 2019-05-15 | Payer: MEDICARE

## 2019-05-02 ENCOUNTER — Ambulatory Visit: Admit: 2019-05-02 | Discharge: 2019-05-04 | Payer: MEDICARE

## 2019-06-03 ENCOUNTER — Ambulatory Visit: Admit: 2019-06-03 | Discharge: 2019-06-04 | Payer: MEDICARE

## 2019-06-03 DIAGNOSIS — R000 Tachycardia, unspecified: Principal | ICD-10-CM

## 2019-06-09 ENCOUNTER — Ambulatory Visit: Admit: 2019-06-09 | Discharge: 2019-06-10 | Payer: MEDICARE

## 2019-06-11 ENCOUNTER — Telehealth: Payer: Self-pay | Admitting: *Deleted

## 2019-06-11 NOTE — Telephone Encounter (Signed)
Received notice from [facility] that they have been unable to contact patient for refill x 2 months.  Last fill was February. Per [facility], patient has been hospitalized multiple times across the state over the last 5 months.  RN confirmed contact information with pharmacy, they will try to reach him again as it appears he was discharged yesterday. [name], RN

## 2019-06-17 ENCOUNTER — Ambulatory Visit
Admit: 2019-06-17 | Discharge: 2019-06-26 | Disposition: A | Discharge status: Home / Self Care | Payer: MEDICARE | Admitting: Otolaryngology

## 2019-06-17 ENCOUNTER — Encounter
Admit: 2019-06-17 | Discharge: 2019-06-26 | Disposition: A | Discharge status: Home / Self Care | Payer: MEDICARE | Attending: Certified Registered" | Admitting: Otolaryngology

## 2019-06-25 MED ORDER — CALCIUM CARBONATE 600 MG CALCIUM (1,500 MG) TABLET
ORAL_TABLET | Freq: Three times a day (TID) | ORAL | 0 refills | 30.00000 days | Status: CP
Start: 2019-06-25 — End: 2019-07-25
  Filled 2019-06-25: qty 180, 30d supply, fill #0

## 2019-06-25 MED FILL — CALCIUM CARBONATE 600 MG CALCIUM (1,500 MG) TABLET: 30 days supply | Qty: 180 | Fill #0 | Status: AC

## 2019-06-25 MED FILL — CALCITRIOL 0.5 MCG CAPSULE: 30 days supply | Qty: 30 | Fill #0 | Status: AC

## 2019-06-26 MED ORDER — CALCITRIOL 0.5 MCG CAPSULE
ORAL_CAPSULE | Freq: Every day | ORAL | 0 refills | 30.00 days | Status: CP
Start: 2019-06-26 — End: 2020-06-25
  Filled 2019-06-25: qty 30, 30d supply, fill #0

## 2019-08-12 DIAGNOSIS — Z8669 Personal history of other diseases of the nervous system and sense organs: Secondary | ICD-10-CM | POA: Insufficient documentation

## 2019-10-07 ENCOUNTER — Ambulatory Visit: Admit: 2019-10-07 | Discharge: 2019-10-08 | Payer: MEDICARE | Attending: Otolaryngology | Primary: Otolaryngology

## 2019-10-07 DIAGNOSIS — N2581 Secondary hyperparathyroidism of renal origin: Principal | ICD-10-CM

## 2019-11-12 ENCOUNTER — Other Ambulatory Visit: Payer: Medicaid Other

## 2019-11-26 ENCOUNTER — Encounter: Payer: Medicaid Other | Admitting: Infectious Disease

## 2019-12-01 ENCOUNTER — Other Ambulatory Visit: Payer: Self-pay | Admitting: Infectious Disease

## 2019-12-01 DIAGNOSIS — B20 Human immunodeficiency virus [HIV] disease: Secondary | ICD-10-CM

## 2019-12-02 ENCOUNTER — Other Ambulatory Visit: Payer: Medicare Other

## 2019-12-02 ENCOUNTER — Other Ambulatory Visit: Payer: Self-pay

## 2019-12-02 DIAGNOSIS — B20 Human immunodeficiency virus [HIV] disease: Secondary | ICD-10-CM

## 2019-12-03 ENCOUNTER — Telehealth: Payer: Self-pay

## 2019-12-03 LAB — T-HELPER CELL (CD4) - (RCID CLINIC ONLY)
CD4 % Helper T Cell: 34 % (ref 33–65)
CD4 T Cell Abs: 282 /uL — ABNORMAL LOW (ref 400–1790)

## 2019-12-03 NOTE — Progress Notes (Signed)
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

## 2019-12-03 NOTE — Telephone Encounter (Signed)
Patient called back to report that he has a penicillin allergy. Will cancel upcoming bicillin appointment and notify provider of need for alternate treatment. Patient would like medication sent to the [facility] [address] in [place], [place].   Beryle Flock, RN

## 2019-12-03 NOTE — Telephone Encounter (Addendum)
Called the patient to relay the below results per Dr. West Bali. First bicillin appointment scheduled for 12/09/19 at 11:30 am. Patient will need next two injection appointments scheduled at his upcoming appointment. RN advised patient that his partners should also be tested and treated. Patient verbalized understanding.   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

## 2019-12-04 ENCOUNTER — Telehealth: Payer: Self-pay

## 2019-12-04 ENCOUNTER — Other Ambulatory Visit: Payer: Self-pay | Admitting: Infectious Diseases

## 2019-12-04 LAB — CBC WITH DIFFERENTIAL/PLATELET
Absolute Monocytes: 395 cells/uL (ref 200–950)
Basophils Absolute: 29 cells/uL (ref 0–200)
Basophils Relative: 0.7 %
Eosinophils Absolute: 277 cells/uL (ref 15–500)
Eosinophils Relative: 6.6 %
HCT: 27.7 % — ABNORMAL LOW (ref 38.5–50.0)
Hemoglobin: 8.4 g/dL — ABNORMAL LOW (ref 13.2–17.1)
Lymphs Abs: 861 cells/uL (ref 850–3900)
MCH: 22.6 pg — ABNORMAL LOW (ref 27.0–33.0)
MCHC: 30.3 g/dL — ABNORMAL LOW (ref 32.0–36.0)
MCV: 74.7 fL — ABNORMAL LOW (ref 80.0–100.0)
MPV: 9.1 fL (ref 7.5–12.5)
Monocytes Relative: 9.4 %
Neutro Abs: 2638 cells/uL (ref 1500–7800)
Neutrophils Relative %: 62.8 %
Platelets: 219 10*3/uL (ref 140–400)
RBC: 3.71 10*6/uL — ABNORMAL LOW (ref 4.20–5.80)
RDW: 16.3 % — ABNORMAL HIGH (ref 11.0–15.0)
Total Lymphocyte: 20.5 %
WBC: 4.2 10*3/uL (ref 3.8–10.8)

## 2019-12-04 LAB — FLUORESCENT TREPONEMAL AB(FTA)-IGG-BLD: Fluorescent Treponemal ABS: REACTIVE — AB

## 2019-12-04 LAB — LIPID PANEL
Cholesterol: 101 mg/dL (ref <200)
HDL: 32 mg/dL — ABNORMAL LOW (ref >=40)
LDL Cholesterol (Calc): 48 mg/dL (calc)
Non-HDL Cholesterol (Calc): 69 mg/dL (calc) (ref <130)
Total CHOL/HDL Ratio: 3.2 (calc) (ref <5.0)
Triglycerides: 125 mg/dL (ref <150)

## 2019-12-04 LAB — RPR: RPR Ser Ql: REACTIVE — AB

## 2019-12-04 LAB — RPR TITER: RPR Titer: 1:8 {titer} — ABNORMAL HIGH

## 2019-12-04 LAB — HIV-1 RNA QUANT-NO REFLEX-BLD
HIV 1 RNA Quant: <20 Copies/mL — ABNORMAL HIGH
HIV-1 RNA Quant, Log: <1.3 Log cps/mL — ABNORMAL HIGH

## 2019-12-04 MED ORDER — DOXYCYCLINE HYCLATE 100 MG PO TABS: 100.0000 mg | ORAL_TABLET | Freq: Two times a day (BID) | ORAL | 0 refills | Status: AC

## 2019-12-04 NOTE — Progress Notes (Unsigned)
doxyc 

## 2019-12-04 NOTE — Telephone Encounter (Signed)
Spoke with the patient to inform him that Dr. West Bali has sent in his doxycycline prescription to his preferred pharmacy. Patient verbalizes understanding.   Beryle Flock, RN

## 2019-12-05 NOTE — Progress Notes (Signed)
Patient notified later that patient has allergy to penicillin later. Will do Doxy 100mg  PO BID for 28 days instead.

## 2019-12-09 ENCOUNTER — Ambulatory Visit: Payer: Medicare Other

## 2019-12-17 ENCOUNTER — Other Ambulatory Visit: Payer: Self-pay | Admitting: Infectious Disease

## 2019-12-17 DIAGNOSIS — B20 Human immunodeficiency virus [HIV] disease: Secondary | ICD-10-CM

## 2019-12-18 ENCOUNTER — Other Ambulatory Visit: Payer: Self-pay | Admitting: Infectious Disease

## 2019-12-18 DIAGNOSIS — B20 Human immunodeficiency virus [HIV] disease: Secondary | ICD-10-CM

## 2019-12-22 DIAGNOSIS — N186 End stage renal disease: Principal | ICD-10-CM

## 2019-12-30 ENCOUNTER — Other Ambulatory Visit: Payer: Self-pay

## 2019-12-30 ENCOUNTER — Encounter: Payer: Self-pay | Admitting: Infectious Disease

## 2019-12-30 ENCOUNTER — Telehealth (INDEPENDENT_AMBULATORY_CARE_PROVIDER_SITE_OTHER): Payer: Medicare Other | Admitting: Infectious Disease

## 2019-12-30 DIAGNOSIS — I10 Essential (primary) hypertension: Secondary | ICD-10-CM

## 2019-12-30 DIAGNOSIS — N08 Glomerular disorders in diseases classified elsewhere: Secondary | ICD-10-CM

## 2019-12-30 DIAGNOSIS — N186 End stage renal disease: Secondary | ICD-10-CM

## 2019-12-30 DIAGNOSIS — B20 Human immunodeficiency virus [HIV] disease: Secondary | ICD-10-CM | POA: Diagnosis not present

## 2019-12-30 DIAGNOSIS — Z992 Dependence on renal dialysis: Secondary | ICD-10-CM

## 2019-12-30 NOTE — Progress Notes (Signed)
Virtual Visit via Telephone Note  I connected with Jim Wyatt on 12/30/19 at 11:00 AM EDT by telephone and verified that I am speaking with the correct person using two identifiers.  Location: Patient: Home Provider: [facility]   I discussed the limitations, risks, security and privacy concerns of performing an evaluation and management service by telephone and the availability of in person appointments. I also discussed with the patient that there may be a patient responsible charge related to this service. The patient expressed understanding and agreed to proceed.   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  We had treated him with TIVICAY and DESCOVY and then changed over to [facility].  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 He is being evaluated for kidney transplant at [facility].  IF transplant team does not want TAF we can look into a different regimen though likely not a STR  Past Medical History:  Diagnosis Date  . Asthma   . CAP (community acquired pneumonia) 10/2013  . Cellulitis 11/2013   LLE  . DVT (deep venous thrombosis) ([place]) 10/2013   BLE  . ESRD on hemodialysis ([place]) 01/2015   AKI with nephrotic syndrome (16gm proteinuria at time of biopsy, from collapsing FSG). 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  Went on HD Dec 2016.   [name] Fever   . Genital warts 03/08/2018  . Headache   . HIV infection ([place]) dx'd 10/2013  . Hypertension   . Leg pain   . Multiple environmental allergies    "trees, dogs, cats"  . Multiple food allergies   . Need for prophylactic vaccination against Streptococcus pneumoniae (pneumococcus) 08/28/2017  .  Seizures ([place])    last seizure per pt in Jan  . Shingles < 2010    Past Surgical History:  Procedure Laterality Date  . AV FISTULA PLACEMENT Left 03/03/2015   Procedure: Creation of Left Arm RADIOCEPHALIC ARTERIOVENOUS FISTULA ;  Surgeon: Mal Misty, MD;  Location: [place];  Service: Vascular;  Laterality: Left;  . COLONOSCOPY N/A 03/18/2016   Procedure: COLONOSCOPY;  Surgeon: Manus Gunning, MD;  Location: [place];  Service: Gastroenterology;  Laterality: N/A;  . FEMUR FRACTURE SURGERY Right 09/2001  . FRACTURE SURGERY    . I & D EXTREMITY Right 12/18/2014   Procedure: IRRIGATION AND DEBRIDEMENT RIGHT LOWER EXTREMITY;  Surgeon: Ralene Ok, MD;  Location: [place];  Service: General;  Laterality: Right;  . INSERTION OF DIALYSIS CATHETER  2016  . LAPAROTOMY N/A 12/28/2015   Procedure: EXPLORATORY LAPAROTOMY BOWEL RESECTION ILEOCECECTOMY;  Surgeon: Clovis Riley, MD;  Location: [place];  Service: General;  Laterality: N/A;    Family History  Problem Relation Age of Onset  . Hypertension Mother   . Diabetes Maternal Grandmother   . Kidney disease Maternal Grandmother       Social History   Socioeconomic History  . Marital status: Single    Spouse name: Not on file  . Number of children: Not on file  . Years of education: Not on file  . Highest education level: Not on file  Occupational History  . Not  on file  Tobacco Use  . Smoking status: Never Smoker  . Smokeless tobacco: Never Used  Substance and Sexual Activity  . Alcohol use: No    Comment: I stopped drinking along time ago "  . Drug use: No  . Sexual activity: Not Currently    Partners: Female    Birth control/protection: Condom  Other Topics Concern  . Not on file  Social History Narrative  . Not on file   Social Determinants of Health   Financial Resource Strain:   . Difficulty of Paying Living Expenses: Not on file  Food Insecurity:   . Worried About Charity fundraiser in the Last Year:  Not on file  . Ran Out of Food in the Last Year: Not on file  Transportation Needs:   . Lack of Transportation (Medical): Not on file  . Lack of Transportation (Non-Medical): Not on file  Physical Activity:   . Days of Exercise per Week: Not on file  . Minutes of Exercise per Session: Not on file  Stress:   . Feeling of Stress : Not on file  Social Connections:   . Frequency of Communication with Friends and Family: Not on file  . Frequency of Social Gatherings with Friends and Family: Not on file  . Attends Religious Services: Not on file  . Active Member of Clubs or Organizations: Not on file  . Attends Archivist Meetings: Not on file  . Marital Status: Not on file    Allergies  Allergen Reactions  . Bactrim [Sulfamethoxazole-Trimethoprim] Other (See Comments)    Renal failure  . Doxercalciferol Hives  . Fish Allergy Hives and Shortness Of Breath    Throat closes up Please do not serve fish for meals. Highly allergic  Please do not serve fish for meals. Highly allergic  Please do not serve fish for meals. Highly allergic  Please do not serve fish for meals. Highly allergic    . Iron Sucrose Hives  . Onion Hives and Other (See Comments)    Per allergy test  . Penicillins Hives  . Procardia [Nifedipine] Hives, Shortness Of Breath and Itching  . Reglan [Metoclopramide] Other (See Comments)    Causes ticks/ jerks  . Vancomycin Other (See Comments)    Patient states vancomycin caused kidney injury  . Metoprolol Hives  . Amlodipine Palpitations and Other (See Comments)    Causes fatigue  . Augmentin [Amoxicillin-Pot Clavulanate] Hives  . Calcitriol Hives     Current Outpatient Medications:  .  acetaminophen (TYLENOL) 500 MG tablet, Take 1,000 mg by mouth every 6 (six) hours as needed (pain)., Disp: , Rfl:  .  albuterol (PROVENTIL HFA;VENTOLIN HFA) 108 (90 Base) MCG/ACT inhaler, Inhale 2 puffs into the lungs every 6 (six) hours as needed for wheezing or  shortness of breath., Disp: 1 Inhaler, Rfl: 3 .  BIKTARVY 50-200-25 MG TABS tablet, Take 1 tablet by mouth daily., Disp: 30 tablet, Rfl: 0 .  calcitRIOL (ROCALTROL) 0.5 MCG capsule, Take by mouth., Disp: , Rfl:  .  carvedilol (COREG) 25 MG tablet, Take 2 tablet by mouth twice a day, Disp: , Rfl:  .  doxycycline (VIBRA-TABS) 100 MG tablet, Take 1 tablet (100 mg total) by mouth 2 (two) times daily., Disp: 56 tablet, Rfl: 0 .  HYDROcodone-acetaminophen (NORCO/VICODIN) 5-325 MG tablet, , Disp: , Rfl:  .  imiquimod (ALDARA) 5 % cream, Apply a thin layer prior TO bedtime 3x week AND LEAVE ON FOR 6-10 HOURS. Wash off  with soap AND WATER.], Disp: 24 each, Rfl: 0 .  minoxidil (LONITEN) 10 MG tablet, Take by mouth., Disp: , Rfl:  .  valsartan (DIOVAN) 160 MG tablet, , Disp: , Rfl:  .  hydrALAZINE (APRESOLINE) 25 MG tablet, TAKE 1 TABLET BY MOUTH THREE TIMES A DAY AS DIRECTED (Patient not taking: Reported on 12/30/2019), Disp: , Rfl: 0 .  isosorbide mononitrate (IMDUR) 60 MG 24 hr tablet, Take 1 tablet (60 mg total) by mouth daily., Disp: 30 tablet, Rfl: 0 .  losartan (COZAAR) 100 MG tablet, Take 1 tablet (100 mg total) by mouth daily. (Patient not taking: Reported on 12/30/2019), Disp: 90 tablet, Rfl: 3   Observations/Objective:  Patient seems to be doing very well  Assessment and Plan:  HIV disease: To new Biktarvy.  His dapsone was discontinued by nephrologist due to an adverse effect of the drug I am not sure what this was but a conceivably could be hemolytic anemia.  Praised him on his adherence and the fact that he now is gotten a CD4 count above 200.  If he has transplantation of his kidney and kidney transplant team does not want him to be on TAF as mentioned we can look at another regimen but I need to review all of his genotypes first.  I have therefore scheduled him to see me through video chat on December 1.  End-stage disease on hemodialysis: As mentioned above he is going for  transplantation.  Hypertension imperative the maintains good blood control now and certainly if he gets a transplant.   Follow Up Instructions:    I discussed the assessment and treatment plan with the patient. The patient was provided an opportunity to ask questions and all were answered. The patient agreed with the plan and demonstrated an understanding of the instructions.   The patient was advised to call back or seek an in-person evaluation if the symptoms worsen or if the condition fails to improve as anticipated.  I provided 21 minutes of non-face-to-face time during this encounter.   Alcide Evener, MD

## 2020-01-15 ENCOUNTER — Other Ambulatory Visit: Payer: Self-pay | Admitting: Infectious Disease

## 2020-01-15 DIAGNOSIS — B20 Human immunodeficiency virus [HIV] disease: Secondary | ICD-10-CM

## 2020-01-16 ENCOUNTER — Other Ambulatory Visit: Payer: Self-pay | Admitting: Infectious Disease

## 2020-01-16 DIAGNOSIS — B20 Human immunodeficiency virus [HIV] disease: Secondary | ICD-10-CM

## 2020-01-28 ENCOUNTER — Other Ambulatory Visit: Payer: Self-pay

## 2020-01-28 ENCOUNTER — Telehealth (INDEPENDENT_AMBULATORY_CARE_PROVIDER_SITE_OTHER): Payer: Medicare Other | Admitting: Infectious Disease

## 2020-01-28 DIAGNOSIS — N186 End stage renal disease: Secondary | ICD-10-CM

## 2020-01-28 DIAGNOSIS — I10 Essential (primary) hypertension: Secondary | ICD-10-CM | POA: Diagnosis not present

## 2020-01-28 DIAGNOSIS — B20 Human immunodeficiency virus [HIV] disease: Secondary | ICD-10-CM

## 2020-01-28 DIAGNOSIS — N08 Glomerular disorders in diseases classified elsewhere: Secondary | ICD-10-CM | POA: Diagnosis not present

## 2020-01-28 DIAGNOSIS — Z992 Dependence on renal dialysis: Secondary | ICD-10-CM

## 2020-01-28 NOTE — Progress Notes (Signed)
Virtual Visit via Telephone Note  I connected with Jim Wyatt on 01/28/20 at  9:45 AM EST by telephone and verified that I am speaking with the correct person using two identifiers.  Location: Patient: Home Provider: [facility]   I discussed the limitations, risks, security and privacy concerns of performing an evaluation and management service by telephone and the availability of in person appointments. I also discussed with the patient that there may be a patient responsible charge related to this service. The patient expressed understanding and agreed to proceed.   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  We had treated him with TIVICAY and DESCOVY and then changed over to [facility].  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 He is being evaluated for kidney [facility] at [facility]  IF transplant team does not want TAF I would consider Dolutegravir and Doravirine as two pills once a day. Juluca possible but he has had need of antacids in past and I am distrustful of hospitals, other MDs who might put him on PPI for SUP for example.   Past Medical History:  Diagnosis Date  . Asthma   . CAP (community acquired pneumonia) 10/2013  . Cellulitis 11/2013   LLE  . DVT (deep venous thrombosis) ([place]) 10/2013   BLE  . ESRD on hemodialysis ([place]) 01/2015   AKI with nephrotic syndrome (16gm proteinuria at time of biopsy, from collapsing FSG). 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  Went on HD Dec 2016.   [name] Fever   . Genital warts 03/08/2018  . Headache   . HIV infection ([place]) dx'd 10/2013  . Hypertension   . Leg pain   . Multiple environmental  allergies    "trees, dogs, cats"  . Multiple food allergies   . Need for prophylactic vaccination against Streptococcus pneumoniae (pneumococcus) 08/28/2017  . Seizures ([place])    last seizure per pt in Jan  . Shingles < 2010    Past Surgical History:  Procedure Laterality Date  . AV FISTULA PLACEMENT Left 03/03/2015   Procedure: Creation of Left Arm RADIOCEPHALIC ARTERIOVENOUS FISTULA ;  Surgeon: Mal Misty, MD;  Location: [place];  Service: Vascular;  Laterality: Left;  . COLONOSCOPY N/A 03/18/2016   Procedure: COLONOSCOPY;  Surgeon: Manus Gunning, MD;  Location: [place];  Service: Gastroenterology;  Laterality: N/A;  . FEMUR FRACTURE SURGERY Right 09/2001  . FRACTURE SURGERY    . I & D EXTREMITY Right 12/18/2014   Procedure: IRRIGATION AND DEBRIDEMENT RIGHT LOWER EXTREMITY;  Surgeon: Ralene Ok, MD;  Location: [place];  Service: General;  Laterality: Right;  . INSERTION OF DIALYSIS CATHETER  2016  . LAPAROTOMY N/A 12/28/2015   Procedure: EXPLORATORY LAPAROTOMY BOWEL RESECTION ILEOCECECTOMY;  Surgeon: Clovis Riley, MD;  Location: [place];  Service: General;  Laterality: N/A;    Family History  Problem Relation Age of Onset  . Hypertension Mother   . Diabetes Maternal Grandmother   . Kidney disease Maternal Grandmother       Social History   Socioeconomic History  . Marital status: Single    Spouse name: Not on  file  . Number of children: Not on file  . Years of education: Not on file  . Highest education level: Not on file  Occupational History  . Not on file  Tobacco Use  . Smoking status: Never Smoker  . Smokeless tobacco: Never Used  Substance and Sexual Activity  . Alcohol use: No    Comment: I stopped drinking along time ago "  . Drug use: No  . Sexual activity: Not Currently    Partners: Female    Birth control/protection: Condom  Other Topics Concern  . Not on file  Social History Narrative  . Not on file   Social Determinants of Health    Financial Resource Strain:   . Difficulty of Paying Living Expenses: Not on file  Food Insecurity:   . Worried About Charity fundraiser in the Last Year: Not on file  . Ran Out of Food in the Last Year: Not on file  Transportation Needs:   . Lack of Transportation (Medical): Not on file  . Lack of Transportation (Non-Medical): Not on file  Physical Activity:   . Days of Exercise per Week: Not on file  . Minutes of Exercise per Session: Not on file  Stress:   . Feeling of Stress : Not on file  Social Connections:   . Frequency of Communication with Friends and Family: Not on file  . Frequency of Social Gatherings with Friends and Family: Not on file  . Attends Religious Services: Not on file  . Active Member of Clubs or Organizations: Not on file  . Attends Archivist Meetings: Not on file  . Marital Status: Not on file    Allergies  Allergen Reactions  . Bactrim [Sulfamethoxazole-Trimethoprim] Other (See Comments)    Renal failure  . Doxercalciferol Hives  . Fish Allergy Hives and Shortness Of Breath    Throat closes up Please do not serve fish for meals. Highly allergic  Please do not serve fish for meals. Highly allergic  Please do not serve fish for meals. Highly allergic  Please do not serve fish for meals. Highly allergic    . Iron Sucrose Hives  . Onion Hives and Other (See Comments)    Per allergy test  . Penicillins Hives  . Procardia [Nifedipine] Hives, Shortness Of Breath and Itching  . Reglan [Metoclopramide] Other (See Comments)    Causes ticks/ jerks  . Vancomycin Other (See Comments)    Patient states vancomycin caused kidney injury  . Metoprolol Hives  . Amlodipine Palpitations and Other (See Comments)    Causes fatigue  . Augmentin [Amoxicillin-Pot Clavulanate] Hives  . Calcitriol Hives     Current Outpatient Medications:  .  acetaminophen (TYLENOL) 500 MG tablet, Take 1,000 mg by mouth every 6 (six) hours as needed (pain)., Disp: ,  Rfl:  .  albuterol (PROVENTIL HFA;VENTOLIN HFA) 108 (90 Base) MCG/ACT inhaler, Inhale 2 puffs into the lungs every 6 (six) hours as needed for wheezing or shortness of breath., Disp: 1 Inhaler, Rfl: 3 .  BIKTARVY 50-200-25 MG TABS tablet, TAKE 1 TABLET BY MOUTH ONCE A DAY, Disp: 30 tablet, Rfl: 0 .  calcitRIOL (ROCALTROL) 0.5 MCG capsule, Take by mouth., Disp: , Rfl:  .  carvedilol (COREG) 25 MG tablet, Take 2 tablet by mouth twice a day, Disp: , Rfl:  .  HYDROcodone-acetaminophen (NORCO/VICODIN) 5-325 MG tablet, , Disp: , Rfl:  .  imiquimod (ALDARA) 5 % cream, Apply a thin layer prior TO bedtime 3x  week AND LEAVE ON FOR 6-10 HOURS. Wash off with soap AND WATER.], Disp: 24 each, Rfl: 0 .  minoxidil (LONITEN) 10 MG tablet, Take by mouth. , Disp: , Rfl:  .  valsartan (DIOVAN) 160 MG tablet, , Disp: , Rfl:  .  doxycycline (VIBRA-TABS) 100 MG tablet, Take 1 tablet (100 mg total) by mouth 2 (two) times daily. (Patient not taking: Reported on 01/28/2020), Disp: 56 tablet, Rfl: 0 .  hydrALAZINE (APRESOLINE) 25 MG tablet, TAKE 1 TABLET BY MOUTH THREE TIMES A DAY AS DIRECTED (Patient not taking: Reported on 12/30/2019), Disp: , Rfl: 0 .  isosorbide mononitrate (IMDUR) 60 MG 24 hr tablet, Take 1 tablet (60 mg total) by mouth daily., Disp: 30 tablet, Rfl: 0 .  losartan (COZAAR) 100 MG tablet, Take 1 tablet (100 mg total) by mouth daily. (Patient not taking: Reported on 12/30/2019), Disp: 90 tablet, Rfl: 3   Observations/Objective:  Patient seems to be doing very well over the phone  Assessment and Plan:  HIV disease: continue  Biktarvy.  If he ends up having renal transplantation would likely switch him over to [facility]  For now we will schedule him to come see me in May in person have labs same day   End-stage disease on hemodialysis: As mentioned above he is going for transplantation.  Hypertension imperative the maintains good blood control now and certainly if he gets a  transplant.   Follow Up Instructions:    I discussed the assessment and treatment plan with the patient. The patient was provided an opportunity to ask questions and all were answered. The patient agreed with the plan and demonstrated an understanding of the instructions.   The patient was advised to call back or seek an in-person evaluation if the symptoms worsen or if the condition fails to improve as anticipated.  I provided 22 minutes of non-face-to-face time during this encounter.   Alcide Evener, MD

## 2020-02-02 DIAGNOSIS — Z9889 Other specified postprocedural states: Secondary | ICD-10-CM | POA: Insufficient documentation

## 2020-02-02 DIAGNOSIS — I272 Pulmonary hypertension, unspecified: Secondary | ICD-10-CM | POA: Insufficient documentation

## 2020-02-25 ENCOUNTER — Other Ambulatory Visit: Payer: Self-pay

## 2020-02-25 DIAGNOSIS — B20 Human immunodeficiency virus [HIV] disease: Secondary | ICD-10-CM

## 2020-02-25 MED ORDER — BIKTARVY 50-200-25 MG PO TABS: 1.0000 | ORAL_TABLET | Freq: Every day | ORAL | 5 refills | Status: DC

## 2020-03-25 ENCOUNTER — Ambulatory Visit: Admit: 2020-03-25 | Discharge: 2020-03-25 | Payer: MEDICARE

## 2020-03-25 ENCOUNTER — Institutional Professional Consult (permissible substitution): Admit: 2020-03-25 | Discharge: 2020-03-25 | Payer: MEDICARE

## 2020-03-25 ENCOUNTER — Ambulatory Visit: Admit: 2020-03-25 | Discharge: 2020-03-25 | Payer: MEDICARE | Attending: Nephrology | Primary: Nephrology

## 2020-03-25 DIAGNOSIS — I120 Hypertensive chronic kidney disease with stage 5 chronic kidney disease or end stage renal disease: Principal | ICD-10-CM

## 2020-03-25 DIAGNOSIS — Z87438 Personal history of other diseases of male genital organs: Principal | ICD-10-CM

## 2020-03-25 DIAGNOSIS — N08 Glomerular disorders in diseases classified elsewhere: Principal | ICD-10-CM

## 2020-03-25 DIAGNOSIS — Z7289 Other problems related to lifestyle: Principal | ICD-10-CM

## 2020-03-25 DIAGNOSIS — Z8616 Personal history of COVID-19: Principal | ICD-10-CM

## 2020-03-25 DIAGNOSIS — Z992 Dependence on renal dialysis: Principal | ICD-10-CM

## 2020-03-25 DIAGNOSIS — B20 Human immunodeficiency virus [HIV] disease: Principal | ICD-10-CM

## 2020-03-25 DIAGNOSIS — I050 Rheumatic mitral stenosis: Principal | ICD-10-CM

## 2020-03-25 DIAGNOSIS — J45909 Unspecified asthma, uncomplicated: Principal | ICD-10-CM

## 2020-03-25 DIAGNOSIS — N186 End stage renal disease: Principal | ICD-10-CM

## 2020-03-25 DIAGNOSIS — Z86718 Personal history of other venous thrombosis and embolism: Principal | ICD-10-CM

## 2020-03-25 DIAGNOSIS — G4730 Sleep apnea, unspecified: Principal | ICD-10-CM

## 2020-03-25 DIAGNOSIS — Z21 Asymptomatic human immunodeficiency virus [HIV] infection status: Principal | ICD-10-CM

## 2020-03-25 DIAGNOSIS — E213 Hyperparathyroidism, unspecified: Principal | ICD-10-CM

## 2020-03-25 DIAGNOSIS — Z79899 Other long term (current) drug therapy: Principal | ICD-10-CM

## 2020-03-25 DIAGNOSIS — N051 Unspecified nephritic syndrome with focal and segmental glomerular lesions: Principal | ICD-10-CM

## 2020-03-25 DIAGNOSIS — Z5181 Encounter for therapeutic drug level monitoring: Principal | ICD-10-CM

## 2020-03-25 DIAGNOSIS — Z01818 Encounter for other preprocedural examination: Principal | ICD-10-CM

## 2020-03-29 ENCOUNTER — Ambulatory Visit: Admit: 2020-03-29 | Discharge: 2020-03-30 | Payer: MEDICARE

## 2020-03-29 DIAGNOSIS — J45909 Unspecified asthma, uncomplicated: Principal | ICD-10-CM

## 2020-03-29 DIAGNOSIS — Z01818 Encounter for other preprocedural examination: Principal | ICD-10-CM

## 2020-03-29 DIAGNOSIS — N186 End stage renal disease: Principal | ICD-10-CM

## 2020-04-06 ENCOUNTER — Ambulatory Visit: Admit: 2020-04-06 | Discharge: 2020-04-07 | Payer: MEDICARE

## 2020-04-06 DIAGNOSIS — G4733 Obstructive sleep apnea (adult) (pediatric): Principal | ICD-10-CM

## 2020-04-06 DIAGNOSIS — I10 Essential (primary) hypertension: Principal | ICD-10-CM

## 2020-04-06 DIAGNOSIS — N186 End stage renal disease: Principal | ICD-10-CM

## 2020-04-06 DIAGNOSIS — R0602 Shortness of breath: Principal | ICD-10-CM

## 2020-04-06 DIAGNOSIS — I82432 Acute embolism and thrombosis of left popliteal vein: Principal | ICD-10-CM

## 2020-04-29 ENCOUNTER — Ambulatory Visit: Admit: 2020-04-29 | Discharge: 2020-04-30 | Payer: MEDICARE

## 2020-06-22 ENCOUNTER — Ambulatory Visit: Admit: 2020-06-22 | Discharge: 2020-06-23 | Payer: MEDICARE | Attending: Pulmonary Disease | Primary: Pulmonary Disease

## 2020-06-22 DIAGNOSIS — R0602 Shortness of breath: Principal | ICD-10-CM

## 2020-06-22 DIAGNOSIS — J45909 Unspecified asthma, uncomplicated: Principal | ICD-10-CM

## 2020-06-22 DIAGNOSIS — R0600 Dyspnea, unspecified: Principal | ICD-10-CM

## 2020-06-22 DIAGNOSIS — N186 End stage renal disease: Principal | ICD-10-CM

## 2020-06-22 DIAGNOSIS — Z01818 Encounter for other preprocedural examination: Principal | ICD-10-CM

## 2020-06-22 MED ORDER — BUDESONIDE-FORMOTEROL HFA 160 MCG-4.5 MCG/ACTUATION AEROSOL INHALER
Freq: Two times a day (BID) | RESPIRATORY_TRACT | 2 refills | 18.00000 days | Status: CP
Start: 2020-06-22 — End: 2021-06-22

## 2020-07-08 ENCOUNTER — Encounter: Payer: Self-pay | Admitting: Infectious Disease

## 2020-07-08 ENCOUNTER — Other Ambulatory Visit: Payer: Self-pay

## 2020-07-08 ENCOUNTER — Ambulatory Visit (INDEPENDENT_AMBULATORY_CARE_PROVIDER_SITE_OTHER): Payer: Medicare Other | Admitting: Infectious Disease

## 2020-07-08 VITALS — BP 133/73 | HR 86 | Temp 97.3°F | Wt 175.0 lb

## 2020-07-08 DIAGNOSIS — B20 Human immunodeficiency virus [HIV] disease: Secondary | ICD-10-CM

## 2020-07-08 DIAGNOSIS — Z8669 Personal history of other diseases of the nervous system and sense organs: Secondary | ICD-10-CM

## 2020-07-08 DIAGNOSIS — I272 Pulmonary hypertension, unspecified: Secondary | ICD-10-CM | POA: Diagnosis not present

## 2020-07-08 DIAGNOSIS — D638 Anemia in other chronic diseases classified elsewhere: Secondary | ICD-10-CM

## 2020-07-08 DIAGNOSIS — N08 Glomerular disorders in diseases classified elsewhere: Secondary | ICD-10-CM | POA: Diagnosis not present

## 2020-07-08 DIAGNOSIS — E8770 Fluid overload, unspecified: Secondary | ICD-10-CM

## 2020-07-08 DIAGNOSIS — Z992 Dependence on renal dialysis: Secondary | ICD-10-CM

## 2020-07-08 DIAGNOSIS — N184 Chronic kidney disease, stage 4 (severe): Secondary | ICD-10-CM

## 2020-07-08 DIAGNOSIS — N185 Chronic kidney disease, stage 5: Secondary | ICD-10-CM | POA: Insufficient documentation

## 2020-07-08 DIAGNOSIS — Z86718 Personal history of other venous thrombosis and embolism: Secondary | ICD-10-CM | POA: Diagnosis not present

## 2020-07-08 DIAGNOSIS — I150 Renovascular hypertension: Secondary | ICD-10-CM | POA: Diagnosis not present

## 2020-07-08 DIAGNOSIS — N186 End stage renal disease: Secondary | ICD-10-CM

## 2020-07-08 DIAGNOSIS — I059 Rheumatic mitral valve disease, unspecified: Secondary | ICD-10-CM | POA: Diagnosis not present

## 2020-07-08 DIAGNOSIS — Z9889 Other specified postprocedural states: Secondary | ICD-10-CM

## 2020-07-08 DIAGNOSIS — I1311 Hypertensive heart and chronic kidney disease without heart failure, with stage 5 chronic kidney disease, or end stage renal disease: Secondary | ICD-10-CM | POA: Insufficient documentation

## 2020-07-08 DIAGNOSIS — R0602 Shortness of breath: Secondary | ICD-10-CM | POA: Diagnosis not present

## 2020-07-08 NOTE — Progress Notes (Signed)
Subjective:  Chief complaint follow-up for HIV disease having some difficulty with a pericardial effusion and pulmonary edema   Patient ID: Jim Wyatt, male    DOB: February 02, 1981, 40 y.o.   MRN: [id number]  HPI  40 year old black man living with HIV, with end-stage renal disease on hemodialysis.  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  Being evaluated for possible renal transplantation both at [facility].  At [facility] they have been working up his pulmonary hypertension he also has a pericardial effusion in the part passes had a pericardial window.  He is going to undergo a VQ scan a CT angiogram and right and left heart catheterization this month at [facility].  Has had some dyspnea when he has fluid buildup that dialysis has made him feel better.  He does still have pericardial effusion on echocardiogram.  He is otherwise doing well and in good spirits highly adherent to his HIV medications as well as antihypertensives   Past Medical History:  Diagnosis Date  . Asthma   . CAP (community acquired pneumonia) 10/2013  . Cellulitis 11/2013   LLE  . DVT (deep venous thrombosis) ([place]) 10/2013   BLE  . ESRD on hemodialysis ([place]) 01/2015   AKI with nephrotic syndrome (16gm proteinuria at time of biopsy, from collapsing FSG). 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  Went on HD Dec 2016.   [name] Fever   . Genital warts 03/08/2018  . Headache   . HIV infection ([place]) dx'd 10/2013  . Hypertension   . Leg pain   . Multiple environmental allergies    "trees, dogs, cats"  . Multiple food allergies   . Need for prophylactic vaccination against Streptococcus pneumoniae (pneumococcus) 08/28/2017  . Seizures ([place])    last seizure per pt in Jan  . Shingles < 2010    Past Surgical History:  Procedure Laterality Date  . AV FISTULA PLACEMENT Left 03/03/2015   Procedure: Creation of Left  Arm RADIOCEPHALIC ARTERIOVENOUS FISTULA ;  Surgeon: Mal Misty, MD;  Location: [place];  Service: Vascular;  Laterality: Left;  . COLONOSCOPY N/A 03/18/2016   Procedure: COLONOSCOPY;  Surgeon: Manus Gunning, MD;  Location: [place];  Service: Gastroenterology;  Laterality: N/A;  . FEMUR FRACTURE SURGERY Right 09/2001  . FRACTURE SURGERY    . I & D EXTREMITY Right 12/18/2014   Procedure: IRRIGATION AND DEBRIDEMENT RIGHT LOWER EXTREMITY;  Surgeon: Ralene Ok, MD;  Location: [place];  Service: General;  Laterality: Right;  . INSERTION OF DIALYSIS CATHETER  2016  . LAPAROTOMY N/A 12/28/2015   Procedure: EXPLORATORY LAPAROTOMY BOWEL RESECTION ILEOCECECTOMY;  Surgeon: Clovis Riley, MD;  Location: [place];  Service: General;  Laterality: N/A;    Family History  Problem Relation Age of Onset  . Hypertension Mother   . Diabetes Maternal Grandmother   . Kidney disease Maternal Grandmother       Social History   Socioeconomic History  . Marital status: Single    Spouse name: Not on file  . Number of children: Not on file  . Years of education: Not on file  . Highest education level: Not on file  Occupational History  . Not on file  Tobacco Use  . Smoking status: Never Smoker  . Smokeless tobacco: Never Used  Substance and Sexual Activity  . Alcohol use: No  Comment: I stopped drinking along time ago "  . Drug use: No  . Sexual activity: Not Currently    Partners: Female    Birth control/protection: Condom    Comment: declined condoms 06/2020  Other Topics Concern  . Not on file  Social History Narrative  . Not on file   Social Determinants of Health   Financial Resource Strain: Not on file  Food Insecurity: Not on file  Transportation Needs: Not on file  Physical Activity: Not on file  Stress: Not on file  Social Connections: Not on file    Allergies  Allergen Reactions  . Bactrim [Sulfamethoxazole-Trimethoprim] Other (See Comments)    Renal failure   . Doxercalciferol Hives  . Fish Allergy Hives and Shortness Of Breath    Throat closes up Please do not serve fish for meals. Highly allergic  Please do not serve fish for meals. Highly allergic  Please do not serve fish for meals. Highly allergic  Please do not serve fish for meals. Highly allergic    . Iron Sucrose Hives  . Onion Hives and Other (See Comments)    Per allergy test  . Penicillins Hives  . Procardia [Nifedipine] Hives, Shortness Of Breath and Itching  . Reglan [Metoclopramide] Other (See Comments)    Causes ticks/ jerks  . Vancomycin Other (See Comments)    Patient states vancomycin caused kidney injury  . Metoprolol Hives  . Amlodipine Palpitations and Other (See Comments)    Causes fatigue  . Augmentin [Amoxicillin-Pot Clavulanate] Hives  . Calcitriol Hives     Current Outpatient Medications:  .  acetaminophen (TYLENOL) 500 MG tablet, Take 1,000 mg by mouth every 6 (six) hours as needed (pain)., Disp: , Rfl:  .  albuterol (PROVENTIL HFA;VENTOLIN HFA) 108 (90 Base) MCG/ACT inhaler, Inhale 2 puffs into the lungs every 6 (six) hours as needed for wheezing or shortness of breath., Disp: 1 Inhaler, Rfl: 3 .  bictegravir-emtricitabine-tenofovir AF (BIKTARVY) 50-200-25 MG TABS tablet, Take 1 tablet by mouth daily., Disp: 30 tablet, Rfl: 5 .  carvedilol (COREG) 25 MG tablet, Take 2 tablet by mouth twice a day, Disp: , Rfl:  .  hydrALAZINE (APRESOLINE) 25 MG tablet, TAKE 1 TABLET BY MOUTH THREE TIMES A DAY AS DIRECTED, Disp: , Rfl: 0 .  HYDROcodone-acetaminophen (NORCO/VICODIN) 5-325 MG tablet, , Disp: , Rfl:  .  imiquimod (ALDARA) 5 % cream, Apply a thin layer prior TO bedtime 3x week AND LEAVE ON FOR 6-10 HOURS. Wash off with soap AND WATER.], Disp: 24 each, Rfl: 0 .  valsartan (DIOVAN) 160 MG tablet, , Disp: , Rfl:  .  doxycycline (VIBRA-TABS) 100 MG tablet, Take 1 tablet (100 mg total) by mouth 2 (two) times daily. (Patient not taking: Reported on 07/08/2020), Disp: 56  tablet, Rfl: 0 .  isosorbide mononitrate (IMDUR) 60 MG 24 hr tablet, Take 1 tablet (60 mg total) by mouth daily., Disp: 30 tablet, Rfl: 0 .  losartan (COZAAR) 100 MG tablet, Take 1 tablet (100 mg total) by mouth daily. (Patient not taking: Reported on 07/08/2020), Disp: 90 tablet, Rfl: 3 .  minoxidil (LONITEN) 10 MG tablet, Take by mouth.  (Patient not taking: Reported on 07/08/2020), Disp: , Rfl:    Review of Systems  Constitutional: Negative for activity change, appetite change, chills, diaphoresis, fatigue, fever and unexpected weight change.  HENT: Negative for congestion, rhinorrhea, sinus pressure, sneezing, sore throat and trouble swallowing.   Eyes: Negative for photophobia and visual disturbance.  Respiratory: Positive for  shortness of breath. Negative for cough, chest tightness, wheezing and stridor.   Cardiovascular: Negative for chest pain, palpitations and leg swelling.  Gastrointestinal: Negative for abdominal distention, abdominal pain, anal bleeding, blood in stool, constipation, diarrhea, nausea and vomiting.  Genitourinary: Negative for difficulty urinating, dysuria, flank pain and hematuria.  Musculoskeletal: Negative for arthralgias, back pain, gait problem, joint swelling and myalgias.  Skin: Negative for color change, pallor, rash and wound.  Neurological: Negative for dizziness, tremors, weakness and light-headedness.  Hematological: Negative for adenopathy. Does not bruise/bleed easily.  Psychiatric/Behavioral: Negative for agitation, behavioral problems, confusion, decreased concentration, dysphoric mood and sleep disturbance.       Objective:   Physical Exam Constitutional:      General: He is not in acute distress.    Appearance: Normal appearance. He is well-developed. He is not ill-appearing or diaphoretic.  HENT:     Head: Normocephalic and atraumatic.     Right Ear: Hearing and external ear normal.     Left Ear: Hearing and external ear normal.     Nose: No  nasal deformity or rhinorrhea.  Eyes:     General: No scleral icterus.    Conjunctiva/sclera: Conjunctivae normal.     Right eye: Right conjunctiva is not injected.     Left eye: Left conjunctiva is not injected.     Pupils: Pupils are equal, round, and reactive to light.  Neck:     Vascular: No JVD.  Cardiovascular:     Rate and Rhythm: Normal rate and regular rhythm.     Heart sounds: S1 normal and S2 normal.  Abdominal:     General: Bowel sounds are normal. There is no distension.     Palpations: Abdomen is soft.     Tenderness: There is no abdominal tenderness.  Musculoskeletal:        General: Normal range of motion.     Right shoulder: Normal.     Left shoulder: Normal.     Cervical back: Normal range of motion and neck supple.     Right hip: Normal.     Left hip: Normal.     Right knee: Normal.     Left knee: Normal.  Lymphadenopathy:     Head:     Right side of head: No submandibular, preauricular or posterior auricular adenopathy.     Left side of head: No submandibular, preauricular or posterior auricular adenopathy.     Cervical: No cervical adenopathy.     Right cervical: No superficial or deep cervical adenopathy.    Left cervical: No superficial or deep cervical adenopathy.  Skin:    General: Skin is warm and dry.     Coloration: Skin is not pale.     Findings: No abrasion, bruising, ecchymosis, erythema, lesion or rash.     Nails: There is no clubbing.  Neurological:     General: No focal deficit present.     Mental Status: He is alert and oriented to person, place, and time.     Sensory: No sensory deficit.     Coordination: Coordination normal.     Gait: Gait normal.  Psychiatric:        Attention and Perception: He is attentive.        Mood and Affect: Mood normal.        Speech: Speech normal.        Behavior: Behavior normal. Behavior is cooperative.        Thought Content: Thought content normal.  Judgment: Judgment normal.      Fistula  present on left arm     Assessment & Plan:  HIV disease continue Biktarvy for now.  If we need to put him on TAF sparing consider Dolutegravir and Doravirine Check labs today.  End-stage renal disease on hemodialysis and being worked up for renal transplantation  Hypertension on 3 different medications and much better controlled.  Pericardial effusion: Passive pericardial window of this is being evaluated by team at [facility] as well understanding is currently attempts are being made to dialyze off fluid.  Pulmonary hypertension and history of thromboembolic disease: Going to have VQ scan CT angiogram and right left heart catheterization at [facility].  I spent greater than 40 minutes with the patient including greater than 50% of time in face to face counsel of the patient and reviewing his pertinent radiographic and laboratory data and in coordination of his care.

## 2020-07-09 LAB — T-HELPER CELL (CD4) - (RCID CLINIC ONLY)
CD4 % Helper T Cell: 31 % — ABNORMAL LOW (ref 33–65)
CD4 T Cell Abs: 233 /uL — ABNORMAL LOW (ref 400–1790)

## 2020-07-12 LAB — CBC WITH DIFFERENTIAL/PLATELET
Absolute Monocytes: 412 cells/uL (ref 200–950)
Basophils Absolute: 50 cells/uL (ref 0–200)
Basophils Relative: 1.2 %
Eosinophils Absolute: 181 cells/uL (ref 15–500)
Eosinophils Relative: 4.3 %
HCT: 29.9 % — ABNORMAL LOW (ref 38.5–50.0)
Hemoglobin: 9.2 g/dL — ABNORMAL LOW (ref 13.2–17.1)
Lymphs Abs: 773 cells/uL — ABNORMAL LOW (ref 850–3900)
MCH: 23.5 pg — ABNORMAL LOW (ref 27.0–33.0)
MCHC: 30.8 g/dL — ABNORMAL LOW (ref 32.0–36.0)
MCV: 76.3 fL — ABNORMAL LOW (ref 80.0–100.0)
MPV: 9.6 fL (ref 7.5–12.5)
Monocytes Relative: 9.8 %
Neutro Abs: 2785 cells/uL (ref 1500–7800)
Neutrophils Relative %: 66.3 %
Platelets: 206 10*3/uL (ref 140–400)
RBC: 3.92 10*6/uL — ABNORMAL LOW (ref 4.20–5.80)
RDW: 16.2 % — ABNORMAL HIGH (ref 11.0–15.0)
Total Lymphocyte: 18.4 %
WBC: 4.2 10*3/uL (ref 3.8–10.8)

## 2020-07-12 LAB — HIV-1 RNA QUANT-NO REFLEX-BLD
HIV 1 RNA Quant: <20 Copies/mL — ABNORMAL HIGH
HIV-1 RNA Quant, Log: <1.3 Log cps/mL — ABNORMAL HIGH

## 2020-07-12 LAB — HEPATITIS B SURFACE ANTIBODY, QUANTITATIVE: Hep B S AB Quant (Post): 350 m[IU]/mL (ref >=10)

## 2020-07-12 LAB — RPR TITER: RPR Titer: 1:4 {titer} — ABNORMAL HIGH

## 2020-07-12 LAB — FLUORESCENT TREPONEMAL AB(FTA)-IGG-BLD: Fluorescent Treponemal ABS: REACTIVE — AB

## 2020-07-12 LAB — RPR: RPR Ser Ql: REACTIVE — AB

## 2020-07-20 ENCOUNTER — Ambulatory Visit: Admit: 2020-07-20 | Discharge: 2020-08-02 | Payer: MEDICARE

## 2020-07-21 ENCOUNTER — Other Ambulatory Visit: Payer: Self-pay | Admitting: Infectious Disease

## 2020-07-21 DIAGNOSIS — B20 Human immunodeficiency virus [HIV] disease: Secondary | ICD-10-CM

## 2020-08-10 ENCOUNTER — Ambulatory Visit: Admit: 2020-08-10 | Discharge: 2020-08-11 | Payer: MEDICARE

## 2020-08-10 DIAGNOSIS — R0602 Shortness of breath: Principal | ICD-10-CM

## 2020-08-10 DIAGNOSIS — I82432 Acute embolism and thrombosis of left popliteal vein: Principal | ICD-10-CM

## 2020-08-10 DIAGNOSIS — R000 Tachycardia, unspecified: Principal | ICD-10-CM

## 2020-08-10 DIAGNOSIS — N186 End stage renal disease: Principal | ICD-10-CM

## 2020-08-10 DIAGNOSIS — G4733 Obstructive sleep apnea (adult) (pediatric): Principal | ICD-10-CM

## 2020-08-10 DIAGNOSIS — J45909 Unspecified asthma, uncomplicated: Principal | ICD-10-CM

## 2020-08-10 DIAGNOSIS — G40909 Epilepsy, unspecified, not intractable, without status epilepticus: Principal | ICD-10-CM

## 2020-08-10 DIAGNOSIS — E214 Other specified disorders of parathyroid gland: Principal | ICD-10-CM

## 2020-08-10 DIAGNOSIS — B20 Human immunodeficiency virus [HIV] disease: Principal | ICD-10-CM

## 2020-08-10 DIAGNOSIS — R9431 Abnormal electrocardiogram [ECG] [EKG]: Principal | ICD-10-CM

## 2020-08-10 DIAGNOSIS — I10 Essential (primary) hypertension: Principal | ICD-10-CM

## 2020-11-16 ENCOUNTER — Ambulatory Visit
Admit: 2020-11-16 | Discharge: 2020-11-17 | Payer: MEDICARE | Attending: Student in an Organized Health Care Education/Training Program | Primary: Student in an Organized Health Care Education/Training Program

## 2020-11-16 DIAGNOSIS — I10 Essential (primary) hypertension: Principal | ICD-10-CM

## 2021-01-06 ENCOUNTER — Ambulatory Visit: Payer: Medicare Other | Admitting: Infectious Disease

## 2021-01-08 ENCOUNTER — Other Ambulatory Visit: Payer: Self-pay | Admitting: Infectious Disease

## 2021-01-08 DIAGNOSIS — B20 Human immunodeficiency virus [HIV] disease: Secondary | ICD-10-CM

## 2021-01-10 ENCOUNTER — Other Ambulatory Visit: Payer: Self-pay | Admitting: Infectious Disease

## 2021-01-10 DIAGNOSIS — B20 Human immunodeficiency virus [HIV] disease: Secondary | ICD-10-CM

## 2021-01-11 ENCOUNTER — Ambulatory Visit: Admit: 2021-01-11 | Discharge: 2021-01-12 | Payer: MEDICARE | Attending: Pulmonary Disease | Primary: Pulmonary Disease

## 2021-01-11 ENCOUNTER — Ambulatory Visit: Admit: 2021-01-11 | Discharge: 2021-01-12 | Payer: MEDICARE

## 2021-01-11 DIAGNOSIS — I5083 High output heart failure: Principal | ICD-10-CM

## 2021-02-01 ENCOUNTER — Encounter: Payer: Self-pay | Admitting: Infectious Disease

## 2021-02-01 ENCOUNTER — Other Ambulatory Visit (HOSPITAL_COMMUNITY): Payer: Self-pay

## 2021-02-01 ENCOUNTER — Ambulatory Visit (INDEPENDENT_AMBULATORY_CARE_PROVIDER_SITE_OTHER): Payer: Medicare Other | Admitting: Infectious Disease

## 2021-02-01 ENCOUNTER — Other Ambulatory Visit: Payer: Self-pay

## 2021-02-01 VITALS — BP 163/97 | HR 89 | Temp 98.2°F | Wt 184.0 lb

## 2021-02-01 DIAGNOSIS — Z7185 Encounter for immunization safety counseling: Secondary | ICD-10-CM

## 2021-02-01 DIAGNOSIS — N186 End stage renal disease: Secondary | ICD-10-CM

## 2021-02-01 DIAGNOSIS — D649 Anemia, unspecified: Secondary | ICD-10-CM

## 2021-02-01 DIAGNOSIS — N032 Chronic nephritic syndrome with diffuse membranous glomerulonephritis: Secondary | ICD-10-CM | POA: Diagnosis not present

## 2021-02-01 DIAGNOSIS — N08 Glomerular disorders in diseases classified elsewhere: Secondary | ICD-10-CM | POA: Diagnosis not present

## 2021-02-01 DIAGNOSIS — B20 Human immunodeficiency virus [HIV] disease: Secondary | ICD-10-CM

## 2021-02-01 DIAGNOSIS — Z992 Dependence on renal dialysis: Secondary | ICD-10-CM

## 2021-02-01 DIAGNOSIS — G4733 Obstructive sleep apnea (adult) (pediatric): Secondary | ICD-10-CM

## 2021-02-01 HISTORY — DX: Encounter for immunization safety counseling: Z71.85

## 2021-02-01 MED ORDER — BIKTARVY 50-200-25 MG PO TABS
1.0000 | ORAL_TABLET | Freq: Every day | ORAL | 11 refills | Status: DC
  Filled 2021-02-01 – 2021-02-08 (×2): qty 30, 30d supply, fill #0
  Filled 2021-03-04: qty 30, 30d supply, fill #1
  Filled 2021-03-31: qty 30, 30d supply, fill #2
  Filled 2021-04-26: qty 30, 30d supply, fill #3
  Filled 2021-05-24: qty 30, 30d supply, fill #4
  Filled 2021-06-21: qty 30, 30d supply, fill #5
  Filled 2021-07-18: qty 30, 30d supply, fill #6
  Filled 2021-08-16: qty 30, 30d supply, fill #7
  Filled 2021-10-03: qty 30, 30d supply, fill #8

## 2021-02-01 NOTE — Progress Notes (Signed)
Subjective:  Chief complaint follow-up for HIV disease having some difficulty with a pericardial effusion and pulmonary edema   Patient ID: Jim Wyatt, male    DOB: [birth year], 40 y.o.   MRN: [id number]  HPI  40 year old black man living with HIV, with end-stage renal disease on hemodialysis.  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  He is being evaluated for possible renal transplantation both at [facility] and [facility].  At [name] they have been working up his pulmonary hypertension   Per Dr.  Sink note from [facility] patient does have pulmonary hypertension based on echocardiogram and his right heart catheterization.  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  He recommended optimizing volume status and left heart disease investigating other causes of high cardiac output as well as discussion with nephrology idea of ligating AV fistula and switching back to her dialysis catheter.  He is also been diagnosed with obstructive sleep apnea encouraged to use BiPAP   Past Medical History:  Diagnosis Date   Asthma    CAP (community acquired pneumonia) 10/2013   Cellulitis 11/2013   LLE   DVT (deep venous thrombosis) ([place]) 10/2013   BLE   ESRD on hemodialysis ([place]) 01/2015   AKI with nephrotic syndrome (16gm proteinuria at time of biopsy, from collapsing FSG). 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  Went on HD Dec 2016.    Fever    Genital warts 03/08/2018   Headache    HIV infection ([place]) dx'd 10/2013   Hypertension    Leg pain    Multiple environmental allergies    "trees, dogs, cats"   Multiple food allergies    Need for prophylactic vaccination against  Streptococcus pneumoniae (pneumococcus) 08/28/2017   Seizures ([place])    last seizure per pt in Jan   Shingles < 2010    Past Surgical History:  Procedure Laterality Date   AV FISTULA PLACEMENT Left 03/03/2015   Procedure: Creation of Left Arm RADIOCEPHALIC ARTERIOVENOUS FISTULA ;  Surgeon: Mal Misty, MD;  Location: [place];  Service: Vascular;  Laterality: Left;   COLONOSCOPY N/A 03/18/2016   Procedure: COLONOSCOPY;  Surgeon: Manus Gunning, MD;  Location: [place];  Service: Gastroenterology;  Laterality: N/A;   FEMUR FRACTURE SURGERY Right 09/2001   FRACTURE SURGERY     I & D EXTREMITY Right 12/18/2014   Procedure: IRRIGATION AND DEBRIDEMENT RIGHT LOWER EXTREMITY;  Surgeon: Ralene Ok, MD;  Location: [place];  Service: General;  Laterality: Right;   INSERTION OF DIALYSIS CATHETER  2016   LAPAROTOMY N/A 12/28/2015   Procedure: EXPLORATORY LAPAROTOMY BOWEL RESECTION ILEOCECECTOMY;  Surgeon: Clovis Riley, MD;  Location: [place];  Service: General;  Laterality: N/A;    Family History  Problem Relation Age of Onset   Hypertension Mother    Diabetes Maternal Grandmother    Kidney disease Maternal Grandmother       Social History   Socioeconomic History   Marital status: Single    Spouse name: Not on file   Number of children: Not on file   Years of education: Not on file   Highest education level: Not on file  Occupational History   Not  on file  Tobacco Use   Smoking status: Never   Smokeless tobacco: Never  Substance and Sexual Activity   Alcohol use: No    Comment: I stopped drinking along time ago "   Drug use: No   Sexual activity: Not Currently    Partners: Female    Birth control/protection: Condom    Comment: declined condoms 06/2020  Other Topics Concern   Not on file  Social History Narrative   Not on file   Social Determinants of Health   Financial Resource Strain: Not on file  Food Insecurity: Not on file  Transportation Needs: Not on file   Physical Activity: Not on file  Stress: Not on file  Social Connections: Not on file    Allergies  Allergen Reactions   Bactrim [Sulfamethoxazole-Trimethoprim] Other (See Comments)    Renal failure   Doxercalciferol Hives   Fish Allergy Hives and Shortness Of Breath    Throat closes up Please do not serve fish for meals. Highly allergic  Please do not serve fish for meals. Highly allergic  Please do not serve fish for meals. Highly allergic  Please do not serve fish for meals. Highly allergic     Iron Sucrose Hives   Onion Hives and Other (See Comments)    Per allergy test   Penicillins Hives   Procardia [Nifedipine] Hives, Shortness Of Breath and Itching   Reglan [Metoclopramide] Other (See Comments)    Causes ticks/ jerks   Vancomycin Other (See Comments)    Patient states vancomycin caused kidney injury   Metoprolol Hives   Amlodipine Palpitations and Other (See Comments)    Causes fatigue   Augmentin [Amoxicillin-Pot Clavulanate] Hives   Calcitriol Hives     Current Outpatient Medications:    acetaminophen (TYLENOL) 500 MG tablet, Take 1,000 mg by mouth every 6 (six) hours as needed (pain)., Disp: , Rfl:    albuterol (PROVENTIL HFA;VENTOLIN HFA) 108 (90 Base) MCG/ACT inhaler, Inhale 2 puffs into the lungs every 6 (six) hours as needed for wheezing or shortness of breath., Disp: 1 Inhaler, Rfl: 3   carvedilol (COREG) 25 MG tablet, Take 2 tablet by mouth twice a day, Disp: , Rfl:    hydrALAZINE (APRESOLINE) 25 MG tablet, TAKE 1 TABLET BY MOUTH THREE TIMES A DAY AS DIRECTED, Disp: , Rfl: 0   HYDROcodone-acetaminophen (NORCO/VICODIN) 5-325 MG tablet, , Disp: , Rfl:    imiquimod (ALDARA) 5 % cream, Apply a thin layer prior TO bedtime 3x week AND LEAVE ON FOR 6-10 HOURS. Wash off with soap AND WATER.], Disp: 24 each, Rfl: 0   valsartan (DIOVAN) 160 MG tablet, , Disp: , Rfl:    bictegravir-emtricitabine-tenofovir AF (BIKTARVY) 50-200-25 MG TABS tablet, Take 1 tablet by mouth  daily., Disp: 30 tablet, Rfl: 11   doxycycline (VIBRA-TABS) 100 MG tablet, Take 1 tablet (100 mg total) by mouth 2 (two) times daily. (Patient not taking: Reported on 07/08/2020), Disp: 56 tablet, Rfl: 0   isosorbide mononitrate (IMDUR) 60 MG 24 hr tablet, Take 1 tablet (60 mg total) by mouth daily., Disp: 30 tablet, Rfl: 0   losartan (COZAAR) 100 MG tablet, Take 1 tablet (100 mg total) by mouth daily. (Patient not taking: Reported on 07/08/2020), Disp: 90 tablet, Rfl: 3   minoxidil (LONITEN) 10 MG tablet, Take by mouth.  (Patient not taking: Reported on 07/08/2020), Disp: , Rfl:    Review of Systems  Constitutional:  Negative for activity change, appetite change, chills, diaphoresis, fatigue, fever and unexpected  weight change.  HENT:  Negative for congestion, rhinorrhea, sinus pressure, sneezing, sore throat and trouble swallowing.   Eyes:  Negative for photophobia and visual disturbance.  Respiratory:  Negative for cough, chest tightness, shortness of breath, wheezing and stridor.   Cardiovascular:  Negative for chest pain, palpitations and leg swelling.  Gastrointestinal:  Negative for abdominal distention, abdominal pain, anal bleeding, blood in stool, constipation, diarrhea, nausea and vomiting.  Genitourinary:  Negative for difficulty urinating, dysuria, flank pain and hematuria.  Musculoskeletal:  Negative for arthralgias, back pain, gait problem, joint swelling and myalgias.  Skin:  Negative for color change, pallor, rash and wound.  Neurological:  Negative for dizziness, tremors, weakness and light-headedness.  Hematological:  Negative for adenopathy. Does not bruise/bleed easily.  Psychiatric/Behavioral:  Negative for agitation, behavioral problems, confusion, decreased concentration, dysphoric mood and sleep disturbance.       Objective:   Physical Exam Constitutional:      Appearance: He is well-developed.  HENT:     Head: Normocephalic and atraumatic.  Eyes:      Conjunctiva/sclera: Conjunctivae normal.  Cardiovascular:     Rate and Rhythm: Normal rate and regular rhythm.  Pulmonary:     Effort: Pulmonary effort is normal. No respiratory distress.     Breath sounds: No wheezing.  Abdominal:     General: There is no distension.     Palpations: Abdomen is soft.  Musculoskeletal:        General: No tenderness. Normal range of motion.     Cervical back: Normal range of motion and neck supple.  Skin:    General: Skin is warm and dry.     Coloration: Skin is not pale.     Findings: No erythema or rash.  Neurological:     General: No focal deficit present.     Mental Status: He is alert and oriented to person, place, and time.  Psychiatric:        Mood and Affect: Mood normal.        Behavior: Behavior normal.        Thought Content: Thought content normal.        Judgment: Judgment normal.     Fistula present in left arm     Assessment & Plan:  HIV disease:  I am checking a viral load and CD4 count  I am continue Biktarvy but like to switch the prescription over to was a long pharmacy which can mail the medication to him.  As mentioned previously if he goes on to have transplant I would think that his nephrologist to be happy with him not having tenofovir in his regimen even if it is tenofovir alafenamide and I had though of Tivicay and Pifeltro though curiously Davarious claims he did not  All right the [facility] well though this is curious since the drug is nearly identical to the bictegravir in [employer].  As he is going to switch regimens to optimize his ARV in the setting of transplantation.  End-stage renal disease on hemodialysis: He is still working on getting listed for transplant.  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  OSA should use BIPAP End-stage renal disease on hemodialysis and being worked up for renal transplantation  Hypertension on 3 different medications and much better controlled.  Pericardial effusion: Passive pericardial window of this is being evaluated by team at [facility] as well understanding is currently attempts are being made to dialyze off fluid.  Pulmonary hypertension and history of thromboembolic disease: Going to have VQ scan CT angiogram and right left heart catheterization at [facility].  I spent greater than 40 minutes with the patient including greater than 50% of time in face to face counsel of the patient and reviewing his pertinent radiographic and laboratory data and in coordination of his care.

## 2021-02-01 NOTE — Addendum Note (Signed)
Addended by: Caffie Pinto on: 02/01/2021 03:07 PM   Modules accepted: Orders

## 2021-02-02 ENCOUNTER — Telehealth: Payer: Self-pay

## 2021-02-02 LAB — T-HELPER CELL (CD4) - (RCID CLINIC ONLY)
CD4 % Helper T Cell: 31 % — ABNORMAL LOW (ref 33–65)
CD4 T Cell Abs: 260 /uL — ABNORMAL LOW (ref 400–1790)

## 2021-02-02 NOTE — Telephone Encounter (Signed)
-----   Message from Truman Hayward, MD sent at 02/02/2021  8:34 AM EST ----- Marcello Moores hgb down to 7 range. He is near need for transfusion, if he has symptoms he DOES need one. Otherwise should be rechecked. Maybe with HD? ----- Message ----- From: Interface, [facility] Lab Results In Sent: 02/01/2021   3:48 PM EST To: Truman Hayward, MD

## 2021-02-02 NOTE — Telephone Encounter (Signed)
Patient stated that HD has been checking HGB and that it has been in the 7 range for a few weeks now. Patient reported he isn't having any sx's and has dialysis today. I advised him of Dr.Van Dam message to have hgb rechecked. He verbalized his understanding.      [place], CMA

## 2021-02-04 LAB — CBC WITH DIFFERENTIAL/PLATELET
Absolute Monocytes: 328 cells/uL (ref 200–950)
Basophils Absolute: 51 cells/uL (ref 0–200)
Basophils Relative: 1.3 %
Eosinophils Absolute: 250 cells/uL (ref 15–500)
Eosinophils Relative: 6.4 %
HCT: 23.2 % — ABNORMAL LOW (ref 38.5–50.0)
Hemoglobin: 7.2 g/dL — ABNORMAL LOW (ref 13.2–17.1)
Lymphs Abs: 858 cells/uL (ref 850–3900)
MCH: 24.6 pg — ABNORMAL LOW (ref 27.0–33.0)
MCHC: 31 g/dL — ABNORMAL LOW (ref 32.0–36.0)
MCV: 79.2 fL — ABNORMAL LOW (ref 80.0–100.0)
MPV: 10.1 fL (ref 7.5–12.5)
Monocytes Relative: 8.4 %
Neutro Abs: 2414 cells/uL (ref 1500–7800)
Neutrophils Relative %: 61.9 %
Platelets: 159 10*3/uL (ref 140–400)
RBC: 2.93 10*6/uL — ABNORMAL LOW (ref 4.20–5.80)
RDW: 16.6 % — ABNORMAL HIGH (ref 11.0–15.0)
Total Lymphocyte: 22 %
WBC: 3.9 10*3/uL (ref 3.8–10.8)

## 2021-02-04 LAB — HIV-1 RNA QUANT-NO REFLEX-BLD
HIV 1 RNA Quant: <20 Copies/mL — ABNORMAL HIGH
HIV-1 RNA Quant, Log: <1.3 Log cps/mL — ABNORMAL HIGH

## 2021-02-04 LAB — RPR TITER: RPR Titer: 1:2 {titer} — ABNORMAL HIGH

## 2021-02-04 LAB — FLUORESCENT TREPONEMAL AB(FTA)-IGG-BLD: Fluorescent Treponemal ABS: REACTIVE — AB

## 2021-02-04 LAB — RPR: RPR Ser Ql: REACTIVE — AB

## 2021-02-08 ENCOUNTER — Other Ambulatory Visit (HOSPITAL_COMMUNITY): Payer: Self-pay

## 2021-02-10 DIAGNOSIS — N186 End stage renal disease: Principal | ICD-10-CM

## 2021-03-03 ENCOUNTER — Other Ambulatory Visit (HOSPITAL_COMMUNITY): Payer: Self-pay

## 2021-03-04 ENCOUNTER — Other Ambulatory Visit (HOSPITAL_COMMUNITY): Payer: Self-pay

## 2021-03-04 DIAGNOSIS — R399 Unspecified symptoms and signs involving the genitourinary system: Principal | ICD-10-CM

## 2021-03-04 DIAGNOSIS — N186 End stage renal disease: Principal | ICD-10-CM

## 2021-03-04 DIAGNOSIS — Z01818 Encounter for other preprocedural examination: Principal | ICD-10-CM

## 2021-03-28 ENCOUNTER — Other Ambulatory Visit (HOSPITAL_COMMUNITY): Payer: Self-pay

## 2021-03-31 ENCOUNTER — Other Ambulatory Visit (HOSPITAL_COMMUNITY): Payer: Self-pay

## 2021-04-06 ENCOUNTER — Other Ambulatory Visit (HOSPITAL_COMMUNITY): Payer: Self-pay

## 2021-04-26 ENCOUNTER — Other Ambulatory Visit (HOSPITAL_COMMUNITY): Payer: Self-pay

## 2021-05-03 ENCOUNTER — Other Ambulatory Visit (HOSPITAL_COMMUNITY): Payer: Self-pay

## 2021-05-17 ENCOUNTER — Ambulatory Visit: Admit: 2021-05-17 | Discharge: 2021-05-18 | Payer: MEDICARE | Attending: Pulmonary Disease | Primary: Pulmonary Disease

## 2021-05-17 DIAGNOSIS — R0609 Other forms of dyspnea: Principal | ICD-10-CM

## 2021-05-17 DIAGNOSIS — N186 End stage renal disease: Principal | ICD-10-CM

## 2021-05-19 ENCOUNTER — Ambulatory Visit: Admit: 2021-05-19 | Discharge: 2021-05-20 | Payer: MEDICARE | Attending: Specialist | Primary: Specialist

## 2021-05-19 DIAGNOSIS — I770 Arteriovenous fistula, acquired: Principal | ICD-10-CM

## 2021-05-24 ENCOUNTER — Other Ambulatory Visit (HOSPITAL_COMMUNITY): Payer: Self-pay

## 2021-05-30 ENCOUNTER — Other Ambulatory Visit (HOSPITAL_COMMUNITY): Payer: Self-pay

## 2021-06-07 ENCOUNTER — Other Ambulatory Visit (HOSPITAL_COMMUNITY): Payer: Self-pay

## 2021-06-08 ENCOUNTER — Other Ambulatory Visit (HOSPITAL_COMMUNITY): Payer: Self-pay

## 2021-06-21 ENCOUNTER — Other Ambulatory Visit (HOSPITAL_COMMUNITY): Payer: Self-pay

## 2021-06-23 ENCOUNTER — Other Ambulatory Visit (HOSPITAL_COMMUNITY): Payer: Self-pay

## 2021-07-07 ENCOUNTER — Ambulatory Visit: Admit: 2021-07-07 | Discharge: 2021-07-08 | Payer: MEDICARE

## 2021-07-07 ENCOUNTER — Ambulatory Visit: Admit: 2021-07-07 | Payer: MEDICARE | Attending: Specialist | Primary: Specialist

## 2021-07-07 ENCOUNTER — Ambulatory Visit: Admit: 2021-07-07 | Discharge: 2021-07-08 | Payer: MEDICARE | Attending: Surgery | Primary: Surgery

## 2021-07-12 DIAGNOSIS — N281 Cyst of kidney, acquired: Principal | ICD-10-CM

## 2021-07-12 DIAGNOSIS — Z01818 Encounter for other preprocedural examination: Principal | ICD-10-CM

## 2021-07-13 DIAGNOSIS — B20 Human immunodeficiency virus [HIV] disease: Principal | ICD-10-CM

## 2021-07-13 DIAGNOSIS — Z01810 Encounter for preprocedural cardiovascular examination: Principal | ICD-10-CM

## 2021-07-13 DIAGNOSIS — Z01818 Encounter for other preprocedural examination: Principal | ICD-10-CM

## 2021-07-13 DIAGNOSIS — Z7289 Other problems related to lifestyle: Principal | ICD-10-CM

## 2021-07-13 DIAGNOSIS — N186 End stage renal disease: Principal | ICD-10-CM

## 2021-07-13 DIAGNOSIS — I150 Renovascular hypertension: Principal | ICD-10-CM

## 2021-07-18 ENCOUNTER — Other Ambulatory Visit (HOSPITAL_COMMUNITY): Payer: Self-pay

## 2021-07-20 ENCOUNTER — Other Ambulatory Visit (HOSPITAL_COMMUNITY): Payer: Self-pay

## 2021-07-20 MED ORDER — IPRATROPIUM-ALBUTEROL 0.5-2.5 (3) MG/3ML IN SOLN
RESPIRATORY_TRACT | 1 refills | Status: AC
  Filled 2021-07-20: qty 180, 15d supply, fill #0
  Filled 2022-01-24: qty 180, 15d supply, fill #1

## 2021-07-26 ENCOUNTER — Other Ambulatory Visit (HOSPITAL_COMMUNITY): Payer: Self-pay

## 2021-08-10 DIAGNOSIS — Z01818 Encounter for other preprocedural examination: Principal | ICD-10-CM

## 2021-08-10 DIAGNOSIS — B20 Human immunodeficiency virus [HIV] disease: Principal | ICD-10-CM

## 2021-08-16 ENCOUNTER — Other Ambulatory Visit (HOSPITAL_COMMUNITY): Payer: Self-pay

## 2021-08-16 ENCOUNTER — Ambulatory Visit: Payer: Medicare Other | Admitting: Infectious Disease

## 2021-08-18 ENCOUNTER — Ambulatory Visit: Admit: 2021-08-18 | Discharge: 2021-08-18 | Payer: MEDICARE

## 2021-08-18 ENCOUNTER — Ambulatory Visit: Admit: 2021-08-18 | Discharge: 2021-08-19 | Payer: MEDICARE | Attending: Specialist | Primary: Specialist

## 2021-08-18 ENCOUNTER — Ambulatory Visit: Admit: 2021-08-18 | Discharge: 2021-08-19 | Payer: MEDICARE

## 2021-08-18 DIAGNOSIS — L988 Other specified disorders of the skin and subcutaneous tissue: Principal | ICD-10-CM

## 2021-08-23 ENCOUNTER — Other Ambulatory Visit (HOSPITAL_COMMUNITY): Payer: Self-pay

## 2021-10-03 ENCOUNTER — Other Ambulatory Visit (HOSPITAL_COMMUNITY): Payer: Self-pay

## 2021-10-04 ENCOUNTER — Other Ambulatory Visit (HOSPITAL_COMMUNITY): Payer: Self-pay

## 2021-10-10 ENCOUNTER — Ambulatory Visit
Admit: 2021-10-10 | Discharge: 2021-10-11 | Payer: MEDICARE | Attending: Infectious Disease | Primary: Infectious Disease

## 2021-10-10 DIAGNOSIS — Z23 Encounter for immunization: Principal | ICD-10-CM

## 2021-10-10 DIAGNOSIS — Z01818 Encounter for other preprocedural examination: Principal | ICD-10-CM

## 2021-10-10 DIAGNOSIS — B20 Human immunodeficiency virus [HIV] disease: Principal | ICD-10-CM

## 2021-10-10 DIAGNOSIS — Z880 Allergy status to penicillin: Principal | ICD-10-CM

## 2021-10-11 ENCOUNTER — Other Ambulatory Visit (HOSPITAL_COMMUNITY): Payer: Self-pay

## 2021-10-11 ENCOUNTER — Other Ambulatory Visit: Payer: Self-pay

## 2021-10-11 ENCOUNTER — Ambulatory Visit (INDEPENDENT_AMBULATORY_CARE_PROVIDER_SITE_OTHER): Payer: Medicare Other | Admitting: Infectious Disease

## 2021-10-11 ENCOUNTER — Telehealth: Payer: Self-pay

## 2021-10-11 ENCOUNTER — Encounter: Payer: Self-pay | Admitting: Infectious Disease

## 2021-10-11 VITALS — BP 122/71 | HR 95 | Temp 98.1°F | Wt 180.0 lb

## 2021-10-11 DIAGNOSIS — I272 Pulmonary hypertension, unspecified: Secondary | ICD-10-CM | POA: Diagnosis not present

## 2021-10-11 DIAGNOSIS — Z880 Allergy status to penicillin: Secondary | ICD-10-CM | POA: Diagnosis not present

## 2021-10-11 DIAGNOSIS — N186 End stage renal disease: Secondary | ICD-10-CM | POA: Diagnosis not present

## 2021-10-11 DIAGNOSIS — Z992 Dependence on renal dialysis: Secondary | ICD-10-CM | POA: Diagnosis not present

## 2021-10-11 DIAGNOSIS — B20 Human immunodeficiency virus [HIV] disease: Secondary | ICD-10-CM | POA: Diagnosis present

## 2021-10-11 DIAGNOSIS — N08 Glomerular disorders in diseases classified elsewhere: Secondary | ICD-10-CM | POA: Diagnosis not present

## 2021-10-11 DIAGNOSIS — N032 Chronic nephritic syndrome with diffuse membranous glomerulonephritis: Secondary | ICD-10-CM | POA: Diagnosis not present

## 2021-10-11 DIAGNOSIS — I3139 Other pericardial effusion (noninflammatory): Secondary | ICD-10-CM

## 2021-10-11 MED ORDER — BIKTARVY 50-200-25 MG PO TABS
1.0000 | ORAL_TABLET | Freq: Every day | ORAL | 11 refills | Status: DC
  Filled 2021-10-11 – 2021-11-21 (×3): qty 30, 30d supply, fill #0
  Filled 2021-12-08: qty 30, 30d supply, fill #1
  Filled 2022-01-05: qty 30, 30d supply, fill #2
  Filled 2022-02-01: qty 30, 30d supply, fill #3
  Filled 2022-02-28: qty 30, 30d supply, fill #4
  Filled 2022-03-27: qty 30, 30d supply, fill #5
  Filled 2022-05-25: qty 30, 30d supply, fill #6
  Filled 2022-06-16: qty 30, 30d supply, fill #7
  Filled 2022-07-11: qty 30, 30d supply, fill #8

## 2021-10-11 NOTE — Progress Notes (Signed)
Subjective:  Chief complaint: Follow-up for HIV disease on medications   Patient ID: Jim Wyatt, male    DOB: Jun 08, 1980, 41 y.o.   MRN: [id number]  HPI  41 year old black man living with HIV, with end-stage renal disease on hemodialysis.  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  He is being evaluated for possible renal transplantation both at [facility] and [facility].  At [facility] they have been working up his pulmonary hypertension   Per Dr. [place] Sink note from [facility] patient does have pulmonary hypertension based on echocardiogram and his right heart catheterization.  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  He recommended optimizing volume status and left heart disease investigating other causes of high cardiac output as well as discussion with nephrology idea of ligating AV fistula and switching back to dialysis catheter.  He is also been diagnosed with obstructive sleep apnea encouraged to use BiPAP  The idea of ligating the AV fistula was felt to be not trivial and not particularly a great idea in the context of him also seeking renal transplantation.  He is moving along in the process and is been seen by Truman Hayward, MD at [facility] with ID and she is happy with him continuing on [place] for his HIV in context of his upcoming renal transplant. He is also undergoing PCN allergy testing.She has been insuring he is indeed up to date with Hep A, COVID 19, shingles vaccines.   We did talk again about idea of switch to DTG + DOR vs DTG/RPV but Davey would really prefer to be on [place].     Past Medical History:  Diagnosis Date   Asthma    CAP (community acquired pneumonia) 10/2013   Cellulitis 11/2013   LLE    DVT (deep venous thrombosis) ([place]) 10/2013   BLE   ESRD on hemodialysis ([place]) 01/2015   AKI with nephrotic syndrome (16gm proteinuria at time of biopsy, from collapsing FSG). 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  Went on HD Dec 2016.    Fever    Genital warts 03/08/2018   Headache    HIV infection ([place]) dx'd 10/2013   Hypertension    Leg pain    Multiple environmental allergies    "trees, dogs, cats"   Multiple food allergies    Need for prophylactic vaccination against Streptococcus pneumoniae (pneumococcus) 08/28/2017   Seizures ([place])    last seizure per pt in Jan   Shingles < 2010   Vaccine counseling 02/01/2021    Past Surgical History:  Procedure Laterality Date   AV FISTULA PLACEMENT Left 03/03/2015   Procedure: Creation of Left Arm RADIOCEPHALIC ARTERIOVENOUS FISTULA ;  Surgeon: Mal Misty, MD;  Location: [place];  Service: Vascular;  Laterality: Left;   COLONOSCOPY N/A 03/18/2016   Procedure: COLONOSCOPY;  Surgeon: Manus Gunning, MD;  Location: [place];  Service: Gastroenterology;  Laterality: N/A;   FEMUR FRACTURE SURGERY Right 09/2001   FRACTURE SURGERY     I & D EXTREMITY Right 12/18/2014   Procedure: IRRIGATION AND DEBRIDEMENT RIGHT LOWER EXTREMITY;  Surgeon: Ralene Ok, MD;  Location: [place];  Service: General;  Laterality: Right;   INSERTION OF  DIALYSIS CATHETER  2016   LAPAROTOMY N/A 12/28/2015   Procedure: EXPLORATORY LAPAROTOMY BOWEL RESECTION ILEOCECECTOMY;  Surgeon: Clovis Riley, MD;  Location: [place];  Service: General;  Laterality: N/A;    Family History  Problem Relation Age of Onset   Hypertension Mother    Diabetes Maternal Grandmother    Kidney disease Maternal Grandmother       Social History   Socioeconomic History   Marital status: Single    Spouse name: Not on file   Number of children: Not on file   Years of education: Not on file   Highest education level: Not on  file  Occupational History   Not on file  Tobacco Use   Smoking status: Never   Smokeless tobacco: Never  Substance and Sexual Activity   Alcohol use: No    Comment: I stopped drinking along time ago "   Drug use: No   Sexual activity: Not Currently    Partners: Female    Birth control/protection: Condom    Comment: declined condoms 06/2020  Other Topics Concern   Not on file  Social History Narrative   Not on file   Social Determinants of Health   Financial Resource Strain: Not on file  Food Insecurity: Not on file  Transportation Needs: Not on file  Physical Activity: Not on file  Stress: Not on file  Social Connections: Not on file    Allergies  Allergen Reactions   Bactrim [Sulfamethoxazole-Trimethoprim] Other (See Comments)    Renal failure   Doxercalciferol Hives   Fish Allergy Hives and Shortness Of Breath    Throat closes up Please do not serve fish for meals. Highly allergic  Please do not serve fish for meals. Highly allergic  Please do not serve fish for meals. Highly allergic  Please do not serve fish for meals. Highly allergic     Iron Sucrose Hives   Onion Hives and Other (See Comments)    Per allergy test   Penicillins Hives   Procardia [Nifedipine] Hives, Shortness Of Breath and Itching   Reglan [Metoclopramide] Other (See Comments)    Causes ticks/ jerks   Vancomycin Other (See Comments)    Patient states vancomycin caused kidney injury   Metoprolol Hives   Amlodipine Palpitations and Other (See Comments)    Causes fatigue   Augmentin [Amoxicillin-Pot Clavulanate] Hives   Calcitriol Hives     Current Outpatient Medications:    acetaminophen (TYLENOL) 500 MG tablet, Take 1,000 mg by mouth every 6 (six) hours as needed (pain)., Disp: , Rfl:    albuterol (PROVENTIL HFA;VENTOLIN HFA) 108 (90 Base) MCG/ACT inhaler, Inhale 2 puffs into the lungs every 6 (six) hours as needed for wheezing or shortness of breath., Disp: 1 Inhaler, Rfl: 3    bictegravir-emtricitabine-tenofovir AF (BIKTARVY) 50-200-25 MG TABS tablet, Take 1 tablet by mouth daily., Disp: 30 tablet, Rfl: 11   carvedilol (COREG) 25 MG tablet, Take 2 tablet by mouth twice a day, Disp: , Rfl:    doxycycline (VIBRA-TABS) 100 MG tablet, Take 1 tablet (100 mg total) by mouth 2 (two) times daily. (Patient not taking: Reported on 07/08/2020), Disp: 56 tablet, Rfl: 0   hydrALAZINE (APRESOLINE) 25 MG tablet, TAKE 1 TABLET BY MOUTH THREE TIMES A DAY AS DIRECTED, Disp: , Rfl: 0   HYDROcodone-acetaminophen (NORCO/VICODIN) 5-325 MG tablet, , Disp: , Rfl:    imiquimod (ALDARA) 5 % cream, Apply a thin layer prior TO bedtime 3x week AND LEAVE ON FOR 6-10  HOURS. Wash off with soap AND WATER.], Disp: 24 each, Rfl: 0   ipratropium-albuterol (DUONEB) 0.5-2.5 (3) MG/3ML SOLN, Inhale 1 vial by nebulizer every 6 hours, Disp: 180 mL, Rfl: 1   isosorbide mononitrate (IMDUR) 60 MG 24 hr tablet, Take 1 tablet (60 mg total) by mouth daily., Disp: 30 tablet, Rfl: 0   losartan (COZAAR) 100 MG tablet, Take 1 tablet (100 mg total) by mouth daily. (Patient not taking: Reported on 07/08/2020), Disp: 90 tablet, Rfl: 3   minoxidil (LONITEN) 10 MG tablet, Take by mouth.  (Patient not taking: Reported on 07/08/2020), Disp: , Rfl:    valsartan (DIOVAN) 160 MG tablet, , Disp: , Rfl:    Review of Systems  Constitutional:  Negative for activity change, appetite change, chills, diaphoresis, fatigue, fever and unexpected weight change.  HENT:  Negative for congestion, rhinorrhea, sinus pressure, sneezing, sore throat and trouble swallowing.   Eyes:  Negative for photophobia and visual disturbance.  Respiratory:  Negative for cough, chest tightness, shortness of breath, wheezing and stridor.   Cardiovascular:  Negative for chest pain, palpitations and leg swelling.  Gastrointestinal:  Negative for abdominal distention, abdominal pain, anal bleeding, blood in stool, constipation, diarrhea, nausea and vomiting.   Genitourinary:  Negative for difficulty urinating, dysuria, flank pain and hematuria.  Musculoskeletal:  Negative for arthralgias, back pain, gait problem, joint swelling and myalgias.  Skin:  Negative for color change, pallor, rash and wound.  Neurological:  Negative for dizziness, tremors, weakness and light-headedness.  Hematological:  Negative for adenopathy. Does not bruise/bleed easily.  Psychiatric/Behavioral:  Negative for agitation, behavioral problems, confusion, decreased concentration, dysphoric mood and sleep disturbance.        Objective:   Physical Exam Constitutional:      Appearance: He is well-developed.  HENT:     Head: Normocephalic and atraumatic.  Eyes:     Conjunctiva/sclera: Conjunctivae normal.  Cardiovascular:     Rate and Rhythm: Normal rate and regular rhythm.  Pulmonary:     Effort: Pulmonary effort is normal. No respiratory distress.     Breath sounds: No wheezing.  Abdominal:     General: There is no distension.     Palpations: Abdomen is soft.  Musculoskeletal:        General: No tenderness. Normal range of motion.     Cervical back: Normal range of motion and neck supple.  Skin:    General: Skin is warm and dry.     Coloration: Skin is not pale.     Findings: No erythema or rash.  Neurological:     General: No focal deficit present.     Mental Status: He is alert and oriented to person, place, and time.  Psychiatric:        Mood and Affect: Mood normal.        Behavior: Behavior normal.        Thought Content: Thought content normal.        Judgment: Judgment normal.      Fistula present in left arm     Assessment & Plan:  HIV disease:  Rechecking a viral load and CD4 count today.  I will continue Biktarvy.  The prior discussions about TAF sparing regimen such as TIVICAY and Pifeltro or JULUCA  End-stage renal disease on hemodialysis is going to proceed with transplantation  Pulmonary hypertension: See above  discussion   Allergy getting testing for this  Attention: Continue on 3 different medications I have a size upmost importance of maintaining  aggressive blood pressure control in particular after his transplant  Hypertension on 3 different medications and much better controlled.

## 2021-10-11 NOTE — Telephone Encounter (Signed)
-----   Message from Jim Hayward, MD sent at 10/11/2021  4:37 PM EDT ----- Regarding: FW: Neo was fairly anemic but not far off last value in our system. He should let his nephrologist know. I don't know if he is receiving epogen and iron (need to time oral iron w biktarvy ) ----- Message ----- From: Interface, [facility] Lab Results In Sent: 10/11/2021   4:34 PM EDT To: Jim Hayward, MD

## 2021-10-11 NOTE — Telephone Encounter (Signed)
Called patient regarding provider's message. Will follow up with his nephrologist. Did not have any questions at this time. Leatrice Jewels, RMA

## 2021-10-12 LAB — T-HELPER CELLS (CD4) COUNT (NOT AT ARMC)
CD4 % Helper T Cell: 31 % — ABNORMAL LOW (ref 33–65)
CD4 T Cell Abs: 177 /uL — ABNORMAL LOW (ref 400–1790)

## 2021-10-14 LAB — LIPID PANEL
Cholesterol: 119 mg/dL (ref <200)
HDL: 58 mg/dL (ref >=40)
LDL Cholesterol (Calc): 47 mg/dL (calc)
Non-HDL Cholesterol (Calc): 61 mg/dL (calc) (ref <130)
Total CHOL/HDL Ratio: 2.1 (calc) (ref <5.0)
Triglycerides: 63 mg/dL (ref <150)

## 2021-10-14 LAB — CBC WITH DIFFERENTIAL/PLATELET
Absolute Monocytes: 340 cells/uL (ref 200–950)
Basophils Absolute: 41 cells/uL (ref 0–200)
Basophils Relative: 0.9 %
Eosinophils Absolute: 290 cells/uL (ref 15–500)
Eosinophils Relative: 6.3 %
HCT: 24.6 % — ABNORMAL LOW (ref 38.5–50.0)
Hemoglobin: 7.6 g/dL — ABNORMAL LOW (ref 13.2–17.1)
Lymphs Abs: 612 cells/uL — ABNORMAL LOW (ref 850–3900)
MCH: 23.4 pg — ABNORMAL LOW (ref 27.0–33.0)
MCHC: 30.9 g/dL — ABNORMAL LOW (ref 32.0–36.0)
MCV: 75.7 fL — ABNORMAL LOW (ref 80.0–100.0)
MPV: 9.7 fL (ref 7.5–12.5)
Monocytes Relative: 7.4 %
Neutro Abs: 3317 cells/uL (ref 1500–7800)
Neutrophils Relative %: 72.1 %
Platelets: 158 10*3/uL (ref 140–400)
RBC: 3.25 10*6/uL — ABNORMAL LOW (ref 4.20–5.80)
RDW: 18.4 % — ABNORMAL HIGH (ref 11.0–15.0)
Total Lymphocyte: 13.3 %
WBC: 4.6 10*3/uL (ref 3.8–10.8)

## 2021-10-14 LAB — RPR: RPR Ser Ql: REACTIVE — AB

## 2021-10-14 LAB — FLUORESCENT TREPONEMAL AB(FTA)-IGG-BLD: Fluorescent Treponemal ABS: REACTIVE — AB

## 2021-10-14 LAB — HIV-1 RNA QUANT-NO REFLEX-BLD
HIV 1 RNA Quant: <20 Copies/mL — ABNORMAL HIGH
HIV-1 RNA Quant, Log: <1.3 Log cps/mL — ABNORMAL HIGH

## 2021-10-14 LAB — RPR TITER: RPR Titer: 1:1 {titer} — ABNORMAL HIGH

## 2021-10-28 ENCOUNTER — Other Ambulatory Visit (HOSPITAL_COMMUNITY): Payer: Self-pay

## 2021-11-01 ENCOUNTER — Other Ambulatory Visit (HOSPITAL_COMMUNITY): Payer: Self-pay

## 2021-11-03 ENCOUNTER — Other Ambulatory Visit (HOSPITAL_COMMUNITY): Payer: Self-pay

## 2021-11-19 ENCOUNTER — Other Ambulatory Visit (HOSPITAL_COMMUNITY): Payer: Self-pay

## 2021-11-21 ENCOUNTER — Other Ambulatory Visit (HOSPITAL_COMMUNITY): Payer: Self-pay

## 2021-11-22 ENCOUNTER — Ambulatory Visit: Admit: 2021-11-22 | Discharge: 2021-11-23 | Payer: MEDICARE

## 2021-11-22 ENCOUNTER — Ambulatory Visit: Admit: 2021-11-22 | Discharge: 2021-11-24 | Payer: MEDICARE

## 2021-11-22 ENCOUNTER — Encounter: Admit: 2021-11-22 | Discharge: 2021-11-23 | Payer: MEDICARE | Attending: Nephrology | Primary: Nephrology

## 2021-11-23 ENCOUNTER — Encounter: Admit: 2021-11-23 | Discharge: 2021-11-23 | Payer: MEDICARE | Attending: Nephrology | Primary: Nephrology

## 2021-11-29 ENCOUNTER — Ambulatory Visit: Admit: 2021-11-29 | Discharge: 2021-11-30 | Payer: MEDICARE

## 2021-11-29 DIAGNOSIS — Z889 Allergy status to unspecified drugs, medicaments and biological substances status: Principal | ICD-10-CM

## 2021-11-29 DIAGNOSIS — Z880 Allergy status to penicillin: Principal | ICD-10-CM

## 2021-12-02 DIAGNOSIS — E041 Nontoxic single thyroid nodule: Principal | ICD-10-CM

## 2021-12-02 DIAGNOSIS — Z01818 Encounter for other preprocedural examination: Principal | ICD-10-CM

## 2021-12-02 DIAGNOSIS — R911 Solitary pulmonary nodule: Principal | ICD-10-CM

## 2021-12-08 ENCOUNTER — Other Ambulatory Visit (HOSPITAL_COMMUNITY): Payer: Self-pay

## 2021-12-15 ENCOUNTER — Other Ambulatory Visit (HOSPITAL_COMMUNITY): Payer: Self-pay

## 2022-01-05 ENCOUNTER — Other Ambulatory Visit (HOSPITAL_COMMUNITY): Payer: Self-pay

## 2022-01-11 ENCOUNTER — Other Ambulatory Visit (HOSPITAL_COMMUNITY): Payer: Self-pay

## 2022-01-17 ENCOUNTER — Ambulatory Visit: Admit: 2022-01-17 | Discharge: 2022-01-18 | Payer: MEDICARE

## 2022-01-17 DIAGNOSIS — Z7289 Other problems related to lifestyle: Principal | ICD-10-CM

## 2022-01-17 DIAGNOSIS — N186 End stage renal disease: Principal | ICD-10-CM

## 2022-01-17 DIAGNOSIS — Z0000 Encounter for general adult medical examination without abnormal findings: Principal | ICD-10-CM

## 2022-01-17 DIAGNOSIS — Z01818 Encounter for other preprocedural examination: Principal | ICD-10-CM

## 2022-01-17 DIAGNOSIS — B20 Human immunodeficiency virus [HIV] disease: Principal | ICD-10-CM

## 2022-01-17 DIAGNOSIS — I10 Essential (primary) hypertension: Principal | ICD-10-CM

## 2022-01-24 ENCOUNTER — Other Ambulatory Visit (HOSPITAL_COMMUNITY): Payer: Self-pay

## 2022-01-25 ENCOUNTER — Other Ambulatory Visit (HOSPITAL_COMMUNITY): Payer: Self-pay

## 2022-01-31 ENCOUNTER — Other Ambulatory Visit (HOSPITAL_COMMUNITY): Payer: Self-pay

## 2022-01-31 ENCOUNTER — Encounter (HOSPITAL_COMMUNITY): Payer: Self-pay

## 2022-02-01 ENCOUNTER — Other Ambulatory Visit (HOSPITAL_COMMUNITY): Payer: Self-pay

## 2022-02-02 ENCOUNTER — Other Ambulatory Visit (HOSPITAL_COMMUNITY): Payer: Self-pay

## 2022-02-03 ENCOUNTER — Other Ambulatory Visit (HOSPITAL_COMMUNITY): Payer: Self-pay

## 2022-02-07 ENCOUNTER — Other Ambulatory Visit: Payer: Self-pay

## 2022-02-08 ENCOUNTER — Other Ambulatory Visit: Payer: Self-pay

## 2022-02-24 DIAGNOSIS — Z01818 Encounter for other preprocedural examination: Principal | ICD-10-CM

## 2022-02-24 DIAGNOSIS — E041 Nontoxic single thyroid nodule: Principal | ICD-10-CM

## 2022-02-28 ENCOUNTER — Other Ambulatory Visit (HOSPITAL_COMMUNITY): Payer: Self-pay

## 2022-03-06 ENCOUNTER — Other Ambulatory Visit: Payer: Self-pay

## 2022-03-07 ENCOUNTER — Other Ambulatory Visit (HOSPITAL_COMMUNITY): Payer: Self-pay

## 2022-03-10 ENCOUNTER — Ambulatory Visit: Admit: 2022-03-10 | Discharge: 2022-03-12 | Payer: MEDICARE

## 2022-03-27 ENCOUNTER — Other Ambulatory Visit (HOSPITAL_COMMUNITY): Payer: Self-pay

## 2022-03-27 DIAGNOSIS — B20 Human immunodeficiency virus [HIV] disease: Principal | ICD-10-CM

## 2022-03-27 DIAGNOSIS — N186 End stage renal disease: Principal | ICD-10-CM

## 2022-03-27 DIAGNOSIS — Z01818 Encounter for other preprocedural examination: Principal | ICD-10-CM

## 2022-03-27 DIAGNOSIS — R946 Abnormal results of thyroid function studies: Principal | ICD-10-CM

## 2022-04-03 ENCOUNTER — Other Ambulatory Visit: Payer: Self-pay

## 2022-04-12 ENCOUNTER — Ambulatory Visit: Admit: 2022-04-12 | Discharge: 2022-04-12 | Payer: MEDICARE

## 2022-04-12 ENCOUNTER — Ambulatory Visit
Admit: 2022-04-12 | Discharge: 2022-04-12 | Payer: MEDICARE | Attending: Student in an Organized Health Care Education/Training Program | Primary: Student in an Organized Health Care Education/Training Program

## 2022-04-12 DIAGNOSIS — Z992 Dependence on renal dialysis: Principal | ICD-10-CM

## 2022-04-12 DIAGNOSIS — Z01818 Encounter for other preprocedural examination: Principal | ICD-10-CM

## 2022-04-12 DIAGNOSIS — E559 Vitamin D deficiency, unspecified: Principal | ICD-10-CM

## 2022-04-12 DIAGNOSIS — E212 Other hyperparathyroidism: Principal | ICD-10-CM

## 2022-04-12 DIAGNOSIS — R946 Abnormal results of thyroid function studies: Principal | ICD-10-CM

## 2022-04-12 DIAGNOSIS — R7989 Other specified abnormal findings of blood chemistry: Principal | ICD-10-CM

## 2022-04-12 DIAGNOSIS — E041 Nontoxic single thyroid nodule: Principal | ICD-10-CM

## 2022-04-12 DIAGNOSIS — N2581 Secondary hyperparathyroidism of renal origin: Principal | ICD-10-CM

## 2022-04-12 DIAGNOSIS — N186 End stage renal disease: Principal | ICD-10-CM

## 2022-04-18 ENCOUNTER — Ambulatory Visit: Payer: Medicare Other | Admitting: Infectious Disease

## 2022-04-20 ENCOUNTER — Other Ambulatory Visit (HOSPITAL_COMMUNITY): Payer: Self-pay

## 2022-04-24 ENCOUNTER — Other Ambulatory Visit (HOSPITAL_COMMUNITY): Payer: Self-pay

## 2022-04-26 ENCOUNTER — Other Ambulatory Visit (HOSPITAL_COMMUNITY): Payer: Self-pay

## 2022-05-11 ENCOUNTER — Ambulatory Visit: Admit: 2022-05-11 | Discharge: 2022-05-12 | Payer: MEDICARE | Attending: Surgery | Primary: Surgery

## 2022-05-12 DIAGNOSIS — N186 End stage renal disease: Principal | ICD-10-CM

## 2022-05-25 ENCOUNTER — Ambulatory Visit (INDEPENDENT_AMBULATORY_CARE_PROVIDER_SITE_OTHER): Payer: Medicare Other | Admitting: Infectious Disease

## 2022-05-25 ENCOUNTER — Encounter: Payer: Self-pay | Admitting: Infectious Disease

## 2022-05-25 ENCOUNTER — Other Ambulatory Visit (HOSPITAL_COMMUNITY): Payer: Self-pay

## 2022-05-25 ENCOUNTER — Other Ambulatory Visit: Payer: Self-pay

## 2022-05-25 VITALS — BP 122/76 | HR 89 | Temp 98.2°F | Ht 67.0 in | Wt 169.0 lb

## 2022-05-25 DIAGNOSIS — B20 Human immunodeficiency virus [HIV] disease: Secondary | ICD-10-CM

## 2022-05-25 DIAGNOSIS — Z992 Dependence on renal dialysis: Secondary | ICD-10-CM

## 2022-05-25 DIAGNOSIS — E785 Hyperlipidemia, unspecified: Secondary | ICD-10-CM | POA: Diagnosis not present

## 2022-05-25 DIAGNOSIS — G40909 Epilepsy, unspecified, not intractable, without status epilepticus: Secondary | ICD-10-CM

## 2022-05-25 DIAGNOSIS — N186 End stage renal disease: Secondary | ICD-10-CM

## 2022-05-25 HISTORY — DX: Hyperlipidemia, unspecified: E78.5

## 2022-05-25 NOTE — Progress Notes (Addendum)
Subjective:  Chief complaint: apparently there was some anxiety generated when his viral load was checked at [facility] and found to be 20 and "detected" (this is of no consequence see below)   Patient ID: Jim Wyatt, male    DOB: 1981/02/19, 42 y.o.   MRN: [id number]  HPI  42 year old black man living with HIV, with end-stage renal disease on hemodialysis.  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  He is being evaluated for possible renal transplantation both at [facility] and [facility].  At [facility] they have been working up his pulmonary hypertension   Per Dr. [place] Sink note from [facility] patient does have pulmonary hypertension based on echocardiogram and his right heart catheterization.  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  He recommended optimizing volume status and left heart disease investigating other causes of high cardiac output as well as discussion with nephrology idea of ligating AV fistula and switching back to dialysis catheter.  He is also been diagnosed with obstructive sleep apnea encouraged to use BiPAP  The idea of ligating the AV fistula was felt to be not trivial and not particularly a great idea in the context of him also seeking renal transplantation.  He is moving along in the process and is been seen by Jim Hayward, MD at [facility] with ID and she is happy with him continuing on [place] for his HIV in context of his upcoming renal transplant.   We did talk again about idea of switch to DTG + DOR vs DTG/RPV but Jim Wyatt would really prefer to be on [place].  He has since been seen at [facility] and he relayed to me concern re his viral load being 20 and "detected" and that Transplant Team were concerned by  this.     Past Medical History:  Diagnosis Date   Asthma    CAP (community acquired pneumonia) 10/2013   Cellulitis 11/2013   LLE   DVT (deep venous thrombosis) ([place]) 10/2013   BLE   ESRD on hemodialysis ([place]) 01/2015   AKI with nephrotic syndrome (16gm proteinuria at time of biopsy, from collapsing FSG). 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  Went on HD Dec 2016.    Fever    Genital warts 03/08/2018   Headache    HIV infection ([place]) dx'd 10/2013   Hyperlipidemia 05/25/2022   Hypertension    Leg pain    Multiple environmental allergies    "trees, dogs, cats"   Multiple food allergies    Need for prophylactic vaccination against Streptococcus pneumoniae (pneumococcus) 08/28/2017   Seizures ([place])    last seizure per pt in Jan   Shingles < 2010   Vaccine counseling 02/01/2021    Past Surgical History:  Procedure Laterality Date   AV FISTULA PLACEMENT Left 03/03/2015   Procedure: Creation of Left Arm RADIOCEPHALIC ARTERIOVENOUS FISTULA ;  Surgeon: Mal Misty, MD;  Location: [place];  Service: Vascular;  Laterality: Left;   COLONOSCOPY N/A 03/18/2016   Procedure: COLONOSCOPY;  Surgeon: Manus Gunning, MD;  Location: [place];  Service: Gastroenterology;  Laterality: N/A;   FEMUR FRACTURE SURGERY Right 09/2001   FRACTURE SURGERY     I &  D EXTREMITY Right 12/18/2014   Procedure: IRRIGATION AND DEBRIDEMENT RIGHT LOWER EXTREMITY;  Surgeon: Ralene Ok, MD;  Location: [place] OR;  Service: General;  Laterality: Right;   INSERTION OF DIALYSIS CATHETER  2016   LAPAROTOMY N/A 12/28/2015   Procedure: EXPLORATORY LAPAROTOMY BOWEL RESECTION ILEOCECECTOMY;  Surgeon: Clovis Riley, MD;  Location: [place];  Service: General;  Laterality: N/A;    Family History  Problem Relation Age of Onset   Hypertension Mother    Diabetes Maternal Grandmother    Kidney disease Maternal Grandmother       Social History    Socioeconomic History   Marital status: Single    Spouse name: Not on file   Number of children: Not on file   Years of education: Not on file   Highest education level: Not on file  Occupational History   Not on file  Tobacco Use   Smoking status: Never   Smokeless tobacco: Never  Substance and Sexual Activity   Alcohol use: No    Comment: I stopped drinking along time ago "   Drug use: No   Sexual activity: Not Currently    Partners: Female    Birth control/protection: Condom    Comment: declined condoms 06/2020  Other Topics Concern   Not on file  Social History Narrative   Not on file   Social Determinants of Health   Financial Resource Strain: Not on file  Food Insecurity: Not on file  Transportation Needs: Not on file  Physical Activity: Not on file  Stress: Not on file  Social Connections: Not on file    Allergies  Allergen Reactions   Bactrim [Sulfamethoxazole-Trimethoprim] Other (See Comments)    Renal failure   Doxercalciferol Hives   Fish Allergy Hives and Shortness Of Breath    Throat closes up Please do not serve fish for meals. Highly allergic  Please do not serve fish for meals. Highly allergic  Please do not serve fish for meals. Highly allergic  Please do not serve fish for meals. Highly allergic     Iron Sucrose Hives   Onion Hives and Other (See Comments)    Per allergy test   Procardia [Nifedipine] Hives, Shortness Of Breath and Itching   Reglan [Metoclopramide] Other (See Comments)    Causes ticks/ jerks   Vancomycin Other (See Comments)    Patient states vancomycin caused kidney injury   Metoprolol Hives   Amlodipine Palpitations and Other (See Comments)    Causes fatigue   Augmentin [Amoxicillin-Pot Clavulanate] Hives   Calcitriol Hives     Current Outpatient Medications:    acetaminophen (TYLENOL) 500 MG tablet, Take 1,000 mg by mouth every 6 (six) hours as needed (pain)., Disp: , Rfl:    albuterol (PROVENTIL HFA;VENTOLIN HFA)  108 (90 Base) MCG/ACT inhaler, Inhale 2 puffs into the lungs every 6 (six) hours as needed for wheezing or shortness of breath., Disp: 1 Inhaler, Rfl: 3   bictegravir-emtricitabine-tenofovir AF (BIKTARVY) 50-200-25 MG TABS tablet, Take 1 tablet by mouth daily., Disp: 30 tablet, Rfl: 11   carvedilol (COREG) 25 MG tablet, Take 2 tablet by mouth twice a day, Disp: , Rfl:    HYDROcodone-acetaminophen (NORCO/VICODIN) 5-325 MG tablet, , Disp: , Rfl:    imiquimod (ALDARA) 5 % cream, Apply a thin layer prior TO bedtime 3x week AND LEAVE ON FOR 6-10 HOURS. Wash off with soap AND WATER.], Disp: 24 each, Rfl: 0   ipratropium-albuterol (DUONEB) 0.5-2.5 (3) MG/3ML SOLN, Inhale 1 vial by  nebulizer every 6 hours, Disp: 180 mL, Rfl: 1   minoxidil (LONITEN) 10 MG tablet, Take by mouth., Disp: , Rfl:    valsartan (DIOVAN) 160 MG tablet, , Disp: , Rfl:    doxycycline (VIBRA-TABS) 100 MG tablet, Take 1 tablet (100 mg total) by mouth 2 (two) times daily. (Patient not taking: Reported on 07/08/2020), Disp: 56 tablet, Rfl: 0   hydrALAZINE (APRESOLINE) 25 MG tablet, TAKE 1 TABLET BY MOUTH THREE TIMES A DAY AS DIRECTED (Patient not taking: Reported on 05/25/2022), Disp: , Rfl: 0   isosorbide mononitrate (IMDUR) 60 MG 24 hr tablet, Take 1 tablet (60 mg total) by mouth daily., Disp: 30 tablet, Rfl: 0   losartan (COZAAR) 100 MG tablet, Take 1 tablet (100 mg total) by mouth daily. (Patient not taking: Reported on 07/08/2020), Disp: 90 tablet, Rfl: 3   Review of Systems  Constitutional:  Negative for activity change, appetite change, chills, diaphoresis, fatigue, fever and unexpected weight change.  HENT:  Negative for congestion, rhinorrhea, sinus pressure, sneezing, sore throat and trouble swallowing.   Eyes:  Negative for photophobia and visual disturbance.  Respiratory:  Negative for cough, chest tightness, shortness of breath, wheezing and stridor.   Cardiovascular:  Negative for chest pain, palpitations and leg swelling.   Gastrointestinal:  Negative for abdominal distention, abdominal pain, anal bleeding, blood in stool, constipation, diarrhea, nausea and vomiting.  Genitourinary:  Negative for difficulty urinating, dysuria, flank pain and hematuria.  Musculoskeletal:  Negative for arthralgias, back pain, gait problem, joint swelling and myalgias.  Skin:  Negative for color change, pallor, rash and wound.  Neurological:  Negative for dizziness, tremors, weakness and light-headedness.  Hematological:  Negative for adenopathy. Does not bruise/bleed easily.  Psychiatric/Behavioral:  Negative for agitation, behavioral problems, confusion, decreased concentration, dysphoric mood and sleep disturbance.        Objective:   Physical Exam Constitutional:      Appearance: He is well-developed.  HENT:     Head: Normocephalic and atraumatic.  Eyes:     Conjunctiva/sclera: Conjunctivae normal.  Cardiovascular:     Rate and Rhythm: Normal rate and regular rhythm.  Pulmonary:     Effort: Pulmonary effort is normal. No respiratory distress.     Breath sounds: No wheezing.  Abdominal:     General: There is no distension.     Palpations: Abdomen is soft.  Musculoskeletal:        General: No tenderness. Normal range of motion.     Cervical back: Normal range of motion and neck supple.  Skin:    General: Skin is warm and dry.     Coloration: Skin is not pale.     Findings: No erythema or rash.  Neurological:     General: No focal deficit present.     Mental Status: He is alert and oriented to person, place, and time.  Psychiatric:        Mood and Affect: Mood normal.        Behavior: Behavior normal.        Thought Content: Thought content normal.        Judgment: Judgment normal.      Fistula present in left arm     Assessment & Plan:   HIV disease:  The viral load of 20 is completely inconsequential and represents a "blip" in context of virus being detected at lower range of the assay. It can also  be reported as <20 and "detected" a similarly inconsequential report made by labs  that run the assay rather than patients and physicians who rely on them.  The FDA actually uses a cut-off of <50, >50 in terms of "virological success, viremia" with VL >200 only considered "failure with TWO consecutive VL >200  I will recheck it again but this is a blip. It can represent virus being released from cells undergoing apoptosis for example.  Certainly the assay itself can have tremendous variability which has greater impact on this end of the spectrum versus when we are talking about viruses in the thousands of copies I have for example had a patient have 2 viral loads checked in the same day by accident with viral loads reported at 75 and 150 and was told that this variation was well within the variation of the assay by the lab director  The larger question to me was whether the transplant team had anxiety re TAF, newer formulation of tenofovir. I personally feel quite comfortable in its safety profile and I have never seen it in clinical trials or in my hands cause renal toxicity as I have on numerous occasion with less efficient pro-drug TDF.  IF getting rid of TAF was desired I would want him on two fully active drugs and we could look at [place] or Dolutegravir and Doravirine (two pills). Former would require meds taken with chewable meal and avoidance of PPI, H2 blockers, latter would be two pills  One could even consider going with Dovato DESPITE the 184V and there is literature and experience to support this approach of technicallly only using one fully active drug + the 3tc to which the virus bears R. That being said I have not begun doing this in my practice and I think more data should accumulate before we consider this  I have called and left VM with his transplant coordinator who is returning to town on Monday.  End-stage renal disease on hemodialysis is going to proceed with  transplantation  Pulmonary hypertension: See above discussion   HTN: He is continuing on carvedilol minoxidil and valsartan.  Hyperlipidemia: based on REPRIEVE study Arnaz should be on a statin when he turns 59 and I was ready to start one for him today but he wants to make sure that the [place] does not have issues with pilots being on it  I spent 44 minutes with the patient including than 50% of the time in face to face counseling of the patient regarding how we talk about undetectable with a viral load being less than 50 for purposes of virological success, virological failure above 200, U=UI reviwing his resistance history and drug options personally  along with review of medical records in preparation for the visit and during the visit and in coordination of his care.

## 2022-05-25 NOTE — Addendum Note (Signed)
Addended by: Truman Hayward on: 05/25/2022 05:05 PM   Modules accepted: Level of Service

## 2022-05-26 ENCOUNTER — Telehealth: Admit: 2022-05-26 | Discharge: 2022-05-27 | Payer: MEDICARE

## 2022-05-26 DIAGNOSIS — G40909 Epilepsy, unspecified, not intractable, without status epilepticus: Principal | ICD-10-CM

## 2022-05-26 DIAGNOSIS — B20 Human immunodeficiency virus [HIV] disease: Principal | ICD-10-CM

## 2022-05-26 DIAGNOSIS — I10 Essential (primary) hypertension: Principal | ICD-10-CM

## 2022-05-26 DIAGNOSIS — N186 End stage renal disease: Principal | ICD-10-CM

## 2022-05-26 DIAGNOSIS — I82432 Acute embolism and thrombosis of left popliteal vein: Principal | ICD-10-CM

## 2022-05-26 DIAGNOSIS — Z889 Allergy status to unspecified drugs, medicaments and biological substances status: Principal | ICD-10-CM

## 2022-05-26 DIAGNOSIS — G4733 Obstructive sleep apnea (adult) (pediatric): Principal | ICD-10-CM

## 2022-05-26 DIAGNOSIS — Z01818 Encounter for other preprocedural examination: Principal | ICD-10-CM

## 2022-05-26 DIAGNOSIS — I272 Pulmonary hypertension, unspecified: Principal | ICD-10-CM

## 2022-05-27 LAB — T-HELPER CELLS (CD4) COUNT (NOT AT ARMC)
Absolute CD4: 437 cells/uL — ABNORMAL LOW (ref 490–1740)
CD4 T Helper %: 32 % (ref 30–61)
Total lymphocyte count: 1366 cells/uL (ref 850–3900)

## 2022-05-27 LAB — HIV-1 RNA QUANT-NO REFLEX-BLD
HIV 1 RNA Quant: 74 Copies/mL — ABNORMAL HIGH
HIV-1 RNA Quant, Log: 1.87 Log cps/mL — ABNORMAL HIGH

## 2022-05-27 LAB — RPR: RPR Ser Ql: NONREACTIVE

## 2022-05-29 ENCOUNTER — Telehealth: Payer: Self-pay

## 2022-05-29 ENCOUNTER — Ambulatory Visit: Payer: Medicare Other | Admitting: Infectious Disease

## 2022-05-29 NOTE — Telephone Encounter (Signed)
-----   Message from Truman Hayward, MD sent at 05/28/2022 10:13 AM EDT ----- Regarding: FW: [place] VL came back in 47s. I myself still do not consider this to be clinically significant. His transplant team reportedly were worried about his VL of 20 before which I really think makes no sense. His transplant team should get back in touch w me later this week. If they need further reassurance would be good for them to talk to Truman Hayward as we who is my former fellow and ID MD who saw him at [facility] recently ----- Message ----- From: Interface, [facility] Lab Results In Sent: 05/26/2022  12:24 PM EDT To: Truman Hayward, MD

## 2022-06-06 ENCOUNTER — Encounter
Admit: 2022-06-06 | Discharge: 2022-06-06 | Payer: MEDICARE | Attending: Student in an Organized Health Care Education/Training Program | Primary: Student in an Organized Health Care Education/Training Program

## 2022-06-06 ENCOUNTER — Ambulatory Visit: Admit: 2022-06-06 | Discharge: 2022-06-06 | Payer: MEDICARE

## 2022-06-06 MED ORDER — OXYCODONE 5 MG TABLET
ORAL_TABLET | Freq: Three times a day (TID) | ORAL | 0 refills | 4.00000 days | Status: CP | PRN
Start: 2022-06-06 — End: ?
  Filled 2022-06-06: qty 10, 4d supply, fill #0

## 2022-06-07 DIAGNOSIS — N186 End stage renal disease: Principal | ICD-10-CM

## 2022-06-07 DIAGNOSIS — Z789 Other specified health status: Principal | ICD-10-CM

## 2022-06-12 DIAGNOSIS — Z01818 Encounter for other preprocedural examination: Principal | ICD-10-CM

## 2022-06-12 DIAGNOSIS — B20 Human immunodeficiency virus [HIV] disease: Principal | ICD-10-CM

## 2022-06-16 ENCOUNTER — Other Ambulatory Visit (HOSPITAL_COMMUNITY): Payer: Self-pay

## 2022-06-19 ENCOUNTER — Other Ambulatory Visit: Payer: Self-pay

## 2022-06-22 ENCOUNTER — Encounter: Admit: 2022-06-22 | Discharge: 2022-06-23 | Payer: MEDICARE | Attending: Surgery | Primary: Surgery

## 2022-06-22 ENCOUNTER — Ambulatory Visit: Admit: 2022-06-22 | Discharge: 2022-06-23 | Payer: MEDICARE | Attending: Surgery | Primary: Surgery

## 2022-06-22 DIAGNOSIS — B20 Human immunodeficiency virus [HIV] disease: Principal | ICD-10-CM

## 2022-06-22 DIAGNOSIS — Z01818 Encounter for other preprocedural examination: Principal | ICD-10-CM

## 2022-06-23 ENCOUNTER — Encounter: Admit: 2022-06-23 | Payer: MEDICARE | Attending: Student in an Organized Health Care Education/Training Program

## 2022-06-23 ENCOUNTER — Ambulatory Visit: Admit: 2022-06-23 | Discharge: 2022-07-16 | Disposition: A | Discharge status: Home / Self Care | Payer: MEDICARE

## 2022-06-23 ENCOUNTER — Ambulatory Visit: Admit: 2022-06-23 | Payer: MEDICARE

## 2022-06-23 ENCOUNTER — Ambulatory Visit: Admit: 2022-06-23 | Discharge: 2022-07-16 | Payer: MEDICARE

## 2022-07-11 ENCOUNTER — Other Ambulatory Visit (HOSPITAL_COMMUNITY): Payer: Self-pay

## 2022-07-11 DIAGNOSIS — Z7682 Awaiting organ transplant status: Principal | ICD-10-CM

## 2022-07-16 MED ORDER — ALBUTEROL SULFATE HFA 90 MCG/ACTUATION AEROSOL INHALER
Freq: Four times a day (QID) | RESPIRATORY_TRACT | 0 refills | 0 days | Status: CP | PRN
Start: 2022-07-16 — End: ?

## 2022-07-17 ENCOUNTER — Other Ambulatory Visit (HOSPITAL_COMMUNITY): Payer: Self-pay

## 2022-07-17 MED ORDER — ASPIRIN 81 MG CHEWABLE TABLET
ORAL_TABLET | Freq: Every day | ORAL | 0 refills | 30 days | Status: CP
Start: 2022-07-17 — End: 2022-08-16

## 2022-07-29 ENCOUNTER — Encounter
Admit: 2022-07-29 | Discharge: 2022-08-02 | Disposition: A | Discharge status: Home / Self Care | Payer: MEDICARE | Attending: Student in an Organized Health Care Education/Training Program

## 2022-07-29 ENCOUNTER — Encounter: Admit: 2022-07-29 | Discharge: 2022-08-02 | Disposition: A | Discharge status: Home / Self Care | Payer: MEDICARE

## 2022-07-29 ENCOUNTER — Ambulatory Visit: Admit: 2022-07-29 | Discharge: 2022-08-02 | Disposition: A | Discharge status: Home / Self Care | Payer: MEDICARE

## 2022-07-31 DIAGNOSIS — K921 Melena: Principal | ICD-10-CM

## 2022-08-02 MED ORDER — LIDOCAINE 4 % TOPICAL PATCH
MEDICATED_PATCH | Freq: Two times a day (BID) | TRANSDERMAL | 0 refills | 30 days | Status: CP
Start: 2022-08-02 — End: 2022-09-01

## 2022-08-02 MED ORDER — DICLOFENAC 1 % TOPICAL GEL
Freq: Four times a day (QID) | TOPICAL | 0 refills | 25 days | Status: CP
Start: 2022-08-02 — End: 2022-08-27
  Filled 2022-08-02: qty 200, 25d supply, fill #0

## 2022-08-02 MED ORDER — PANTOPRAZOLE 40 MG TABLET,DELAYED RELEASE
ORAL_TABLET | Freq: Two times a day (BID) | ORAL | 1 refills | 30 days | Status: CP
Start: 2022-08-02 — End: 2022-09-27
  Filled 2022-08-02: qty 60, 30d supply, fill #0

## 2022-08-02 MED ORDER — OXYCODONE 10 MG TABLET
ORAL_TABLET | Freq: Four times a day (QID) | ORAL | 0 refills | 3 days | Status: CP | PRN
Start: 2022-08-02 — End: 2022-08-05
  Filled 2022-08-02: qty 12, 3d supply, fill #0

## 2022-08-03 ENCOUNTER — Ambulatory Visit: Admit: 2022-08-03 | Discharge: 2022-08-04 | Payer: MEDICARE

## 2022-08-03 ENCOUNTER — Ambulatory Visit: Admit: 2022-08-03 | Discharge: 2022-08-04 | Payer: MEDICARE | Attending: Surgery | Primary: Surgery

## 2022-08-03 ENCOUNTER — Telehealth: Payer: Self-pay

## 2022-08-03 NOTE — Transitions of Care (Post Inpatient/ED Visit) (Signed)
   08/03/2022  Name: Jim Wyatt MRN: [id number] DOB: 1980-08-27  Pt no longer a pt- opened in error    Woodfin Ganja LPN [facility] Nurse Health Advisor Direct Dial [phone]

## 2022-08-09 ENCOUNTER — Other Ambulatory Visit (HOSPITAL_COMMUNITY): Payer: Self-pay

## 2022-08-17 ENCOUNTER — Ambulatory Visit
Admit: 2022-08-17 | Discharge: 2022-08-18 | Payer: MEDICARE | Attending: Student in an Organized Health Care Education/Training Program | Primary: Student in an Organized Health Care Education/Training Program

## 2022-08-17 DIAGNOSIS — K922 Gastrointestinal hemorrhage, unspecified: Principal | ICD-10-CM

## 2022-08-24 ENCOUNTER — Encounter: Admit: 2022-08-24 | Discharge: 2022-08-24 | Payer: MEDICARE | Attending: Surgery | Primary: Surgery

## 2022-08-24 ENCOUNTER — Encounter: Admit: 2022-08-24 | Discharge: 2022-08-24 | Payer: MEDICARE

## 2022-08-24 DIAGNOSIS — N186 End stage renal disease: Principal | ICD-10-CM

## 2022-08-24 DIAGNOSIS — B20 Human immunodeficiency virus [HIV] disease: Principal | ICD-10-CM

## 2022-08-24 DIAGNOSIS — Z01818 Encounter for other preprocedural examination: Principal | ICD-10-CM

## 2022-08-25 ENCOUNTER — Ambulatory Visit: Admit: 2022-08-25 | Discharge: 2022-08-25 | Disposition: A | Discharge status: Home / Self Care | Payer: MEDICARE

## 2022-08-25 ENCOUNTER — Telehealth: Payer: Self-pay

## 2022-08-25 ENCOUNTER — Other Ambulatory Visit: Payer: Self-pay

## 2022-08-25 DIAGNOSIS — Z7682 Awaiting organ transplant status: Principal | ICD-10-CM

## 2022-08-25 DIAGNOSIS — Z789 Other specified health status: Principal | ICD-10-CM

## 2022-08-25 DIAGNOSIS — N186 End stage renal disease: Principal | ICD-10-CM

## 2022-08-25 NOTE — Transitions of Care (Post Inpatient/ED Visit) (Signed)
   08/25/2022  Name: Jim Wyatt MRN: [id number] DOB: Dec 26, 1980  Today's TOC FU Call Status:    Attempted to reach the patient regarding the most recent Inpatient/ED visit.  Follow Up Plan: No further outreach attempts will be made at this time. We have been unable to contact the patient. Pt has advised he was no longer a pt of [facility] and does not live in [place] any longer. PCP was removed.  Signature Renelda Loma RMA

## 2022-08-29 ENCOUNTER — Ambulatory Visit: Admit: 2022-08-29 | Discharge: 2022-08-30 | Payer: MEDICARE

## 2022-09-01 ENCOUNTER — Other Ambulatory Visit (HOSPITAL_COMMUNITY): Payer: Self-pay

## 2022-09-02 DIAGNOSIS — Z7682 Awaiting organ transplant status: Principal | ICD-10-CM

## 2022-09-04 ENCOUNTER — Encounter: Admit: 2022-09-04 | Discharge: 2022-09-04 | Payer: MEDICARE

## 2022-09-04 ENCOUNTER — Ambulatory Visit: Admit: 2022-09-04 | Discharge: 2022-09-04 | Payer: MEDICARE

## 2022-09-04 ENCOUNTER — Encounter (HOSPITAL_COMMUNITY): Payer: Self-pay

## 2022-09-04 ENCOUNTER — Other Ambulatory Visit (HOSPITAL_COMMUNITY): Payer: Self-pay

## 2022-09-04 DIAGNOSIS — Z7682 Awaiting organ transplant status: Principal | ICD-10-CM

## 2022-09-05 ENCOUNTER — Ambulatory Visit: Admit: 2022-09-05 | Payer: MEDICARE

## 2022-09-05 ENCOUNTER — Encounter: Admit: 2022-09-05 | Payer: MEDICARE

## 2022-09-05 ENCOUNTER — Encounter: Admit: 2022-09-05 | Payer: MEDICARE | Attending: Certified Registered"

## 2022-09-05 ENCOUNTER — Encounter: Admit: 2022-09-05 | Payer: MEDICARE | Attending: Surgery | Primary: Surgery

## 2022-09-05 ENCOUNTER — Ambulatory Visit
Admit: 2022-09-05 | Discharge: 2022-09-21 | Disposition: A | Discharge status: Home / Self Care | Payer: MEDICARE | Admitting: Surgery

## 2022-09-05 ENCOUNTER — Other Ambulatory Visit: Payer: Self-pay

## 2022-09-06 DIAGNOSIS — Z940 Kidney transplant status: Principal | ICD-10-CM

## 2022-09-06 MED ORDER — ATOVAQUONE 750 MG/5 ML ORAL SUSPENSION
Freq: Every day | ORAL | 0 refills | 30 days
Start: 2022-09-06 — End: 2022-09-06

## 2022-09-06 MED ORDER — MYCOPHENOLATE MOFETIL 250 MG CAPSULE
ORAL_CAPSULE | Freq: Two times a day (BID) | ORAL | 11 refills | 30 days | Status: CP
Start: 2022-09-06 — End: 2023-09-06

## 2022-09-06 MED ORDER — TACROLIMUS 1 MG CAPSULE, IMMEDIATE-RELEASE
ORAL_CAPSULE | Freq: Two times a day (BID) | ORAL | 11 refills | 30 days | Status: CP
Start: 2022-09-06 — End: 2023-09-06

## 2022-09-06 MED ORDER — VALGANCICLOVIR 450 MG TABLET
ORAL_TABLET | Freq: Every day | ORAL | 2 refills | 30 days | Status: CP
Start: 2022-09-06 — End: 2022-12-06

## 2022-09-06 NOTE — Unmapped (Addendum)
Christian Brown is a 42 y.o. 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 Norma Fredrickson.        The patient was taken to the OR on 09/05/2022 for left DDKT with ureterocystostomy . He tolerated the procedure well, was extubated in the OR, and was taken to the PACU where He received routine postoperative care. He was then transferred to the Surgical Stepdown unit for close observation and cardiorespiratory monitoring. The allograft was evaluated with ultrasonography and found to have Perirenal free fluid, favor postoperative.  Due to patient condition and respiratory motion evaluation of the main renal artery anastomosis is extremely limited. Within these constraints, peak systolic velocity at the anastomosis was slightly elevated, likely reflect postoperative edema. Transplant kidney demonstrates satisfactory perfusion. Recommend short interval follow-up to reevaluate anastomosis.   --   . He received routine IV fluid replacement based on urine output.     He was clinically stable postoperatively, maintaining adequate urine output, and was transferred to the floor on POD# 1 . He did well thereafter and diet was slowly advanced. At the time of discharge patient was tolerating a regular diet, had pain controlled with PO pain medication, and able to return to the preoperative ambulatory status. [name] Post transplant course notable for hypertensive urgency requiring ICU transfer, development of esophagitis and gastritis. Serum HSV, CMV negative.     On 09/12/2022 He went under EGD evalusation and severe esophagitis and gasteritis were diagnosed for him. Patient describes burning chest/epigastric pain since transplant surgery significantly affecting PO intake. EGD 7/16 showed severe esophagitis in the middle and distal third of the esophagus. Biopsies show non-specific severe esophagitis and gastropathy without evidence of viral or fungal infection. Given the time course of onset after renal transplant and severity of inflammation, query if esophagitis was recommended to be due to mycophenolate. Therefore Cellcept was substituted to another brand.IV Protonix BID and Carafate and Pepsid were started for him but non were successful in controlling his Heart burn.   During his hospital course he experienced intermittent atypical chest pain with similar ECG pattern as his prior ECG and prior elevated Troponin with decreasing pattern.   Anti-rejection medication levels were monitored and dosages adjusted to maintain a therapeutic regimen with the input of Pharmacy. The patient was seen and assessed by Physical Therapy and deemed suitable for discharge. He will be discharged home on POD#16 in stable condition without home healthcare.    JP drain was removed.    Incision: staples CDI x 4-6 weeks post op  UOP: ***ml at dc  -Passed TOV on POD 4 7/13  Graft Function: s/p DDKT (DCD, KDPI 18%,)with Dr. Norma Fredrickson,  CIT ***, cPRA 7%, CMV positive, EBV positive, HCV Ab negative/ RNA negative, HBsAg negative, cAb negative/sAb positive, HIV positive, RPR negative, HSV-1 positive, HSV-2 positive, VZV IgG positive, Rubella IgG positive, Toxo IgG negative   -post op course c/b DGF? Immediate graft function  Immunosuppression:  -Induction: {Immunosuppression Induction:96661}  -Tac at dc: ***mg BID  -Cellcept at dc: ***mg BID  -Pred at dc: ***  Prophylaxis/ID:  -PJP: {PJP ppx:96663}  -CMV: {CMV ppx:96662}  -ASA 81 at dc: ***      [ ]  L. Kim referral at DC for parathyroid    His diet was slowly advanced and at the time of discharge he was tolerating a regular diet. He was able to void spontaneously and post op pain was well-controlled with P.O. pain medication.  He was assessed by Physical Therapy and Occupational Therapy and found to be suitable for discharge to ***. He was discharged *** in stable condition with a prescription for oral pain medications and scheduled to follow up with [place] in *** weeks.    At the time of discharge the final surgical pathology was still pending.***          An echocardiogram on 09/08/22 showed LVEF 55% from 65%, new moderate mitral stenosis, and elevated R atrial pressure. Cardiology was consulted and recommended outpatient follow-up in the setting of evident long-term compensation.    09/06/2022- POD1: The patient ws HDS. Complained about 8-9/10 pain over the sabdominal surgical incision. The Drain had 70 ml  output, UOP: 700, intraoperative Blood loss: 150, intake: 589 piggyback, po 120 ml.    7/10 RUS: Trace free fluid in the RUQ, No perinephric collection, no hydronephrosis  He received one dose of Thymoglubulin as the induction immune suppressor medication in addition to methylprednisolone and is currently on 125 mg Solumedrol, 150 Thymoglobulin.     09/07/2022- POD2: The patient was HDS and tolerated regular diet this morning. Over last night he had  8/10 incisional site pain and two episodes of high blood pressuer 164/95, HR 103. Intake: 1130, UOP: 1020, Drain output: 105. He had normal 1x BM.     Infectious disease  CONSULT 09/07/2022:  Check HIV VL and CD4 labs as follows:  At first post-hospital visit (~4 weeks)  Within 30 days of any med changes expected to alter ART levels  With any major change in health status (i.e. hospitalizations)  At 60-90 day intervals for 12 months after transplant  At 90-120 day intervals afterwards if fully suppressed and transplant condition is stable (per HIV guidelines)

## 2022-09-07 ENCOUNTER — Other Ambulatory Visit (HOSPITAL_COMMUNITY): Payer: Self-pay

## 2022-09-08 DIAGNOSIS — Z940 Kidney transplant status: Principal | ICD-10-CM

## 2022-09-08 MED ORDER — ATOVAQUONE 750 MG/5 ML ORAL SUSPENSION
Freq: Every day | ORAL | 0 refills | 30 days
Start: 2022-09-08 — End: 2022-09-08

## 2022-09-09 NOTE — Unmapped (Signed)
Transplant plan of care note:    - patient seen at bedside today. Reporting heartburn  - patient hypertensive at bedside; remains of nifedipine GTT  - patient does have evident LE on exam    - patient received IV Lasix 80mg  prior to evaluation with good UOP noted  - recommendations:   - would monitor UOP & BP with IV Lasix and reassess for an additional dose of IV Lasix 80mg  this afternoon/evening   - can also consider increasing his Minoxidil    Arlys John C. Mort Sawyers, D.O.  PGY-IV  Nephrology and Hypertension

## 2022-09-11 DIAGNOSIS — Z940 Kidney transplant status: Principal | ICD-10-CM

## 2022-09-12 ENCOUNTER — Other Ambulatory Visit (HOSPITAL_COMMUNITY): Payer: Self-pay

## 2022-09-12 DIAGNOSIS — Z940 Kidney transplant status: Principal | ICD-10-CM

## 2022-09-13 NOTE — Unmapped (Addendum)
Luminal Gastroenterology Consult Service   Treatment Plan         Assessment and Recommendations:   Christian Brown is a 42 y.o. 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 The patient is seen in consultation at the request of Leona Carry, MD (Surg Transplant [facility])) for odynophagia.     Patient describes burning chest/epigastric pain since transplant surgery significantly affecting PO intake. EGD 7/16 showed severe esophagitis in the middle and distal third of the esophagus. Biopsies show non-specific severe esophagitis and gastropathy without evidence of viral or fungal infection. Given the time course of onset after renal transplant and severity of inflammation, query if esophagitis may be due to mycophenolate which has been reported in literature ( Mycophenolate Mofetil-Induced Esophagitis (https://wilkins.com/) ). Recommend transitioning from mycophenolate to another agent with plan for repeat EGD in about 2 months. Recommend symptomatic treatment with viscous lidocaine in addition to PPI in the meantime.   - continue BID PPI   - viscous lidocaine for symptomatic relief   - recommend transitioning off mycophenolate with plan for repeat EGD in 2 months to assess mucosal healing  - follow up in GI clinic (we will arrange)     Recommendations discussed with the patient's primary team. We will sign-off at this time, please re-contact if additional questions or a new need for consultation arises.

## 2022-09-14 DIAGNOSIS — Z940 Kidney transplant status: Principal | ICD-10-CM

## 2022-09-15 LAB — GLUCOSE 6 PHOSPHATE DEHYDROGENASE: GLUCOSE-6-PHOSPHATE DEHYDROGENASE QUAL: 12.3 U/g{Hb} — ABNORMAL HIGH (ref 5.3–10.3)

## 2022-09-15 LAB — MAGNESIUM: MAGNESIUM: 2.1 mg/dL (ref 1.6–2.6)

## 2022-09-15 LAB — BASIC METABOLIC PANEL
ANION GAP: 6 mmol/L (ref 5–14)
BLOOD UREA NITROGEN: 17 mg/dL (ref 9–23)
BUN / CREAT RATIO: 9
CALCIUM: 11.1 mg/dL — ABNORMAL HIGH (ref 8.7–10.4)
CHLORIDE: 108 mmol/L — ABNORMAL HIGH (ref 98–107)
CO2: 21 mmol/L (ref 20.0–31.0)
CREATININE: 1.91 mg/dL — ABNORMAL HIGH
EGFR CKD-EPI (2021) MALE: 45 mL/min/{1.73_m2} — ABNORMAL LOW (ref >=60)
GLUCOSE RANDOM: 86 mg/dL (ref 70–179)
POTASSIUM: 4.5 mmol/L (ref 3.4–4.8)
SODIUM: 135 mmol/L (ref 135–145)

## 2022-09-15 LAB — CBC
HEMATOCRIT: 36.3 % — ABNORMAL LOW (ref 39.0–48.0)
HEMOGLOBIN: 11.6 g/dL — ABNORMAL LOW (ref 12.9–16.5)
MEAN CORPUSCULAR HEMOGLOBIN CONC: 31.9 g/dL — ABNORMAL LOW (ref 32.0–36.0)
MEAN CORPUSCULAR HEMOGLOBIN: 30.4 pg (ref 25.9–32.4)
MEAN CORPUSCULAR VOLUME: 95.6 fL (ref 77.6–95.7)
MEAN PLATELET VOLUME: 8.3 fL (ref 6.8–10.7)
PLATELET COUNT: 140 10*9/L — ABNORMAL LOW (ref 150–450)
RED BLOOD CELL COUNT: 3.8 10*12/L — ABNORMAL LOW (ref 4.26–5.60)
RED CELL DISTRIBUTION WIDTH: 19.2 % — ABNORMAL HIGH (ref 12.2–15.2)
WBC ADJUSTED: 4.1 10*9/L (ref 3.6–11.2)

## 2022-09-15 LAB — PHOSPHORUS: PHOSPHORUS: 2.3 mg/dL — ABNORMAL LOW (ref 2.4–5.1)

## 2022-09-15 LAB — HIGH SENSITIVITY TROPONIN I - SINGLE: HIGH SENSITIVITY TROPONIN I: 72 ng/L (ref <=53)

## 2022-09-15 LAB — TACROLIMUS LEVEL, TROUGH: TACROLIMUS, TROUGH: 4.7 ng/mL — ABNORMAL LOW (ref 5.0–15.0)

## 2022-09-15 LAB — IONIZED CALCIUM VENOUS: CALCIUM IONIZED VENOUS (MG/DL): 6.5 mg/dL — ABNORMAL HIGH (ref 4.40–5.40)

## 2022-09-15 MED ORDER — BICTEGRAVIR 50 MG-EMTRICITABINE 200 MG-TENOFOVIR ALAFENAM 25 MG TABLET
ORAL_TABLET | Freq: Every morning | ORAL | 11 refills | 30 days
Start: 2022-09-15 — End: ?

## 2022-09-15 MED ORDER — CARVEDILOL 25 MG TABLET
ORAL_TABLET | Freq: Two times a day (BID) | ORAL | 11 refills | 30 days
Start: 2022-09-15 — End: ?

## 2022-09-15 MED ORDER — ASPIRIN 81 MG CHEWABLE TABLET
Freq: Every day | ORAL | 0 refills | 0 days
Start: 2022-09-15 — End: ?

## 2022-09-15 MED ORDER — PANTOPRAZOLE 40 MG TABLET,DELAYED RELEASE
ORAL_TABLET | Freq: Two times a day (BID) | ORAL | 11 refills | 30 days
Start: 2022-09-15 — End: ?

## 2022-09-15 MED ORDER — ALBUTEROL SULFATE HFA 90 MCG/ACTUATION AEROSOL INHALER
Freq: Four times a day (QID) | RESPIRATORY_TRACT | 11 refills | 0 days | PRN
Start: 2022-09-15 — End: ?

## 2022-09-15 MED ORDER — GABAPENTIN 300 MG CAPSULE
ORAL_CAPSULE | Freq: Three times a day (TID) | ORAL | 11 refills | 30 days
Start: 2022-09-15 — End: ?

## 2022-09-15 MED ORDER — DAPSONE 100 MG TABLET
ORAL_TABLET | Freq: Every day | ORAL | 0 refills | 30 days
Start: 2022-09-15 — End: 2022-09-15

## 2022-09-15 MED ORDER — SUCRALFATE 100 MG/ML ORAL SUSPENSION
Freq: Two times a day (BID) | ORAL | 0 refills | 30 days
Start: 2022-09-15 — End: ?

## 2022-09-15 MED ORDER — MYCOPHENOLATE SODIUM 180 MG TABLET,DELAYED RELEASE
ORAL_TABLET | Freq: Two times a day (BID) | ORAL | 11 refills | 30 days | Status: CP
Start: 2022-09-15 — End: 2023-09-15

## 2022-09-15 MED ORDER — TACROLIMUS 1 MG CAPSULE, IMMEDIATE-RELEASE
ORAL_CAPSULE | Freq: Two times a day (BID) | ORAL | 11 refills | 30 days
Start: 2022-09-15 — End: ?

## 2022-09-15 MED ORDER — HYDRALAZINE 50 MG TABLET
ORAL_TABLET | Freq: Three times a day (TID) | ORAL | 11 refills | 30 days | Status: CN
Start: 2022-09-15 — End: ?

## 2022-09-15 MED ORDER — DOCUSATE SODIUM 100 MG TABLET
ORAL_TABLET | Freq: Two times a day (BID) | ORAL | 11 refills | 30 days | PRN
Start: 2022-09-15 — End: ?

## 2022-09-15 MED ORDER — MINOXIDIL 2.5 MG TABLET
ORAL_TABLET | Freq: Every morning | ORAL | 0 refills | 30 days
Start: 2022-09-15 — End: 2022-10-15

## 2022-09-15 MED ORDER — METHOCARBAMOL 500 MG TABLET
ORAL_TABLET | Freq: Four times a day (QID) | ORAL | 3 refills | 30 days
Start: 2022-09-15 — End: ?

## 2022-09-15 MED ORDER — ACETAMINOPHEN 500 MG TABLET
ORAL_TABLET | Freq: Three times a day (TID) | ORAL | 11 refills | 15 days | PRN
Start: 2022-09-15 — End: ?

## 2022-09-15 MED ORDER — PREDNISONE 5 MG TABLET
ORAL_TABLET | Freq: Every day | ORAL | 11 refills | 30 days
Start: 2022-09-15 — End: ?

## 2022-09-15 MED ORDER — ONDANSETRON 4 MG DISINTEGRATING TABLET
ORAL_TABLET | Freq: Three times a day (TID) | 1 refills | 10 days | PRN
Start: 2022-09-15 — End: ?

## 2022-09-15 MED ORDER — LIDOCAINE 4 % TOPICAL PATCH
MEDICATED_PATCH | Freq: Every day | TRANSDERMAL | 11 refills | 30 days
Start: 2022-09-15 — End: ?

## 2022-09-15 MED ORDER — HYDRALAZINE 25 MG TABLET
ORAL_TABLET | Freq: Three times a day (TID) | ORAL | 11 refills | 30 days
Start: 2022-09-15 — End: ?

## 2022-09-15 MED ADMIN — oxyCODONE (ROXICODONE) immediate release tablet 10 mg: 10 mg | ORAL | @ 10:00:00 | Stop: 2022-09-20 | NDC 71335142204

## 2022-09-15 MED ADMIN — albuterol 2.5 mg /3 mL (0.083 %) nebulizer solution 2.5 mg: 2.5 mg | RESPIRATORY_TRACT | @ 17:00:00 | NDC 76204020060

## 2022-09-15 MED ADMIN — minoxidil (LONITEN) tablet 5 mg: 5 mg | ORAL | @ 14:00:00 | NDC 72789027901

## 2022-09-15 MED ADMIN — hydrALAZINE (APRESOLINE) tablet 50 mg: 50 mg | ORAL | @ 19:00:00 | Stop: 2022-09-15 | NDC 77771018410

## 2022-09-15 MED ADMIN — HYDROmorphone (PF) injection Syrg 0.5 mg: .5 mg | INTRAVENOUS | @ 05:00:00 | Stop: 2022-09-15 | NDC 00409426411

## 2022-09-15 MED ADMIN — bictegrav-emtricit-tenofov ala (BIKTARVY) 50-200-25 mg tablet 1 tablet: 1 | ORAL | @ 14:00:00 | NDC 61958250103

## 2022-09-15 MED ADMIN — furosemide (LASIX) injection 40 mg: 40 mg | INTRAVENOUS | @ 17:00:00 | Stop: 2022-09-15 | NDC 72789015701

## 2022-09-15 MED ADMIN — oxyCODONE (ROXICODONE) immediate release tablet 10 mg: 10 mg | ORAL | @ 16:00:00 | Stop: 2022-09-20 | NDC 71335142204

## 2022-09-15 MED ADMIN — pantoprazole (Protonix) injection 40 mg: 40 mg | INTRAVENOUS | @ 14:00:00 | NDC 82009001190

## 2022-09-15 MED ADMIN — heparin (porcine) 5,000 unit/mL injection 5,000 Units: 5000 [IU] | SUBCUTANEOUS | @ 19:00:00 | NDC 00009029101

## 2022-09-15 MED ADMIN — mycophenolate (MYFORTIC) EC tablet 720 mg: 720 mg | ORAL | @ 23:00:00 | NDC 72819015508

## 2022-09-15 MED ADMIN — hydrALAZINE (APRESOLINE) tablet 50 mg: 50 mg | ORAL | @ 10:00:00 | Stop: 2022-09-15 | NDC 77771018410

## 2022-09-15 MED ADMIN — tacrolimus (PROGRAF) capsule 4 mg: 4 mg | SUBLINGUAL | @ 10:00:00 | Stop: 2022-09-15 | NDC 68992304003

## 2022-09-15 MED ADMIN — acetaminophen (TYLENOL) tablet 1,000 mg: 1000 mg | ORAL | @ 10:00:00 | Stop: 2022-09-19 | NDC 53191040901

## 2022-09-15 MED ADMIN — heparin (porcine) 5,000 unit/mL injection 5,000 Units: 5000 [IU] | SUBCUTANEOUS | @ 10:00:00 | NDC 00009029101

## 2022-09-15 MED ADMIN — sucralfate (CARAFATE) oral suspension: 1 g | ORAL | @ 16:00:00 | NDC 72578008105

## 2022-09-15 MED ADMIN — acetaminophen (TYLENOL) tablet 1,000 mg: 1000 mg | ORAL | @ 18:00:00 | Stop: 2022-09-19 | NDC 53191040901

## 2022-09-15 MED ADMIN — sucralfate (CARAFATE) oral suspension: 1 g | ORAL | @ 04:00:00 | NDC 72578008105

## 2022-09-15 MED ADMIN — mycophenolate (CELLCEPT) capsule 1,000 mg: 1000 mg | ORAL | @ 10:00:00 | Stop: 2022-09-15 | NDC 53191040901

## 2022-09-15 MED ADMIN — carvedilol (COREG) tablet 37.5 mg: 37.5 mg | ORAL | @ 14:00:00 | Stop: 2022-09-15 | NDC 82009012805

## 2022-09-15 MED ADMIN — lidocaine 4 % patch 1 patch: 1 | TRANSDERMAL | @ 14:00:00 | NDC 54868389400

## 2022-09-15 MED ADMIN — predniSONE (DELTASONE) tablet 5 mg: 5 mg | ORAL | @ 14:00:00 | NDC 55289085902

## 2022-09-15 MED ADMIN — valGANciclovir (VALCYTE) tablet 450 mg: 450 mg | ORAL | @ 14:00:00 | NDC 69097027703

## 2022-09-15 MED ADMIN — tacrolimus (PROGRAF) capsule 5 mg: 5 mg | SUBLINGUAL | @ 23:00:00 | NDC 00469301601

## 2022-09-15 NOTE — Unmapped (Signed)
Abdominal Transplant Inpatient Pharmacy Education Note    Date: 09/15/2022    Patient: Christian Brown    s/p kidney transplant 2/2 FSGS/HIVAN    Discharge Education Provided: ?    Provided patient a medication card with list of all discharge medications. Discussed anti-rejection and anti-infective medication regimens in depth. The patient's full medication schedule was discussed in detail with the patient and included drug name, indication, dose, appropriate timing of self-administration, drug monitoring, drug/drug interaction drug/food interaction, herbal/over-the-counter medication use, side effect profile, and generic versus brand formulation.    Adverse effects of tacrolimus discussed: Nephrotoxicity, neurotoxicity including headache, tremor and possible seizures and metabolic disturbances including hypertension, hyperglycemia and hyperlipidemia. The patient verbalized understanding regarding not to take their calcineurin inhibitor prior to lab draws to ensure accurate trough levels.    Adverse effects of mycophenolic acid: Decreased blood counts, infection and most commonly gastrointestinal distress consisting of diarrhea, nausea and abdominal pain.     Adverse effects of prednisone discussed: Hypertension, hyperlipidemia, hyperglycemia, mood swings, insomnia, gastric ulcers.      Stressed the importance of medication adherence to transplant outcomes    Educated on avoidance of live vaccines    Educated on contacting outpatient nurse coordinator before starting any herbal supplements and for any medication changes made by providers outside of the transplant team      The patient demonstrated a good understanding of the discharge medications. Patient's mother present for education. Patient and family member were engaged and asked insightful questions. The patient was instructed to bring the card and pill bottles to their first follow up visit. All questions and concerns were answered.    >60 minutes of discharge education was completed with this patient.  To promote optimum therapeutic outcomes and prevent medication errors, the patient???s medication profile was reviewed daily by pharmacy and discussed with the multidisciplinary transplant team.     Rance Muir, PharmD,   BCPS

## 2022-09-15 NOTE — Unmapped (Cosign Needed)
According to the patient's increasing hypercalcemia , based on the Nephrology team recommendations, several treatment strategies like Cinacalcet, Normal saline infusion and Lasix were recommended to the patients. However he refused to receive Cinacalcet referring to a hx of allergy. He also was reluctant to the NS and Lasix to be administered to him regardless of the comprehensive discussion about the scientific reason behind each medication and the indications.      Bernestine Amass (MD)  General Surgery Resident  PGY1

## 2022-09-15 NOTE — Unmapped (Signed)
Plan of care reviewed with patient. VSS. Chest and throat pain remained pt's main concern overnight to the point where he only had bites of a sandwich for dinner. PRN oxycodone and magic mouth wash were given, but neither seemed to ease pt's discomfort. MD made aware and a one-time dose of 0.5 ml IV dilaudid was ordered and given. Abdominal incision C/D/I. 1 BM. Voiding adequately. Ambulating independently. Protective precautions maintained. No falls or injuries this shift. Will continue to monitor.    Problem: Wound  Goal: Optimal Coping  Outcome: Progressing  Goal: Optimal Functional Ability  Outcome: Progressing  Intervention: Optimize Functional Ability  Recent Flowsheet Documentation  Taken 09/14/2022 2000 by Lucienne Minks, RN  Activity Management: up ad lib  Goal: Absence of Infection Signs and Symptoms  Outcome: Progressing  Intervention: Prevent or Manage Infection  Recent Flowsheet Documentation  Taken 09/15/2022 0400 by Lucienne Minks, RN  Isolation Precautions: protective precautions maintained  Taken 09/15/2022 0200 by Lucienne Minks, RN  Isolation Precautions: protective precautions maintained  Taken 09/15/2022 0000 by Lucienne Minks, RN  Isolation Precautions: protective precautions maintained  Taken 09/14/2022 2200 by Lucienne Minks, RN  Isolation Precautions: protective precautions maintained  Taken 09/14/2022 2000 by Lucienne Minks, RN  Isolation Precautions: protective precautions maintained  Goal: Improved Oral Intake  Outcome: Progressing  Goal: Optimal Pain Control and Function  Outcome: Progressing  Goal: Skin Health and Integrity  Outcome: Progressing  Intervention: Optimize Skin Protection  Recent Flowsheet Documentation  Taken 09/14/2022 2000 by Lucienne Minks, RN  Activity Management: up ad lib  Pressure Reduction Techniques: frequent weight shift encouraged  Goal: Optimal Wound Healing  Outcome: Progressing     Problem: Adult Inpatient Plan of Care  Goal: Plan of Care Review  Outcome: Progressing  Goal: Patient-Specific Goal (Individualized)  Outcome: Progressing  Goal: Absence of Hospital-Acquired Illness or Injury  Outcome: Progressing  Intervention: Prevent Skin Injury  Recent Flowsheet Documentation  Taken 09/14/2022 2000 by Lucienne Minks, RN  Positioning for Skin: Supine/Back  Intervention: Prevent and Manage VTE (Venous Thromboembolism) Risk  Recent Flowsheet Documentation  Taken 09/15/2022 0400 by Lucienne Minks, RN  Anti-Embolism Device Type: SCD, Knee  Anti-Embolism Intervention: Refused  Anti-Embolism Device Location: BLE  Taken 09/15/2022 0200 by Lucienne Minks, RN  Anti-Embolism Device Type: SCD, Knee  Anti-Embolism Intervention: Refused  Anti-Embolism Device Location: BLE  Taken 09/15/2022 0000 by Lucienne Minks, RN  Anti-Embolism Device Type: SCD, Knee  Anti-Embolism Intervention: Refused  Anti-Embolism Device Location: BLE  Taken 09/14/2022 2200 by Lucienne Minks, RN  Anti-Embolism Device Type: SCD, Knee  Anti-Embolism Intervention: Refused  Anti-Embolism Device Location: BLE  Taken 09/14/2022 2000 by Lucienne Minks, RN  Anti-Embolism Device Type: SCD, Knee  Anti-Embolism Intervention: Refused  Anti-Embolism Device Location: BLE  Goal: Optimal Comfort and Wellbeing  Outcome: Progressing  Goal: Readiness for Transition of Care  Outcome: Progressing  Goal: Rounds/Family Conference  Outcome: Progressing     Problem: Fall Injury Risk  Goal: Absence of Fall and Fall-Related Injury  Outcome: Progressing     Problem: Infection  Goal: Absence of Infection Signs and Symptoms  Outcome: Progressing  Intervention: Prevent or Manage Infection  Recent Flowsheet Documentation  Taken 09/15/2022 0400 by Lucienne Minks, RN  Isolation Precautions: protective precautions maintained  Taken 09/15/2022 0200 by Lucienne Minks, RN  Isolation Precautions: protective precautions maintained  Taken 09/15/2022 0000 by Lucienne Minks, RN  Isolation Precautions: protective precautions maintained  Taken 09/14/2022 2200 by Lucienne Minks, RN  Isolation Precautions: protective precautions maintained  Taken 09/14/2022 2000 by Lucienne Minks, RN  Isolation Precautions: protective precautions maintained     Problem: Self-Care Deficit  Goal: Improved Ability to Complete Activities of Daily Living  Outcome: Progressing

## 2022-09-15 NOTE — Unmapped (Incomplete)
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   He appears to be doing well. States he is back to 90% except for his leg swelling.    Will change his epivir to 150 mg daily when he completes his liquid rx. Will see him back in 1 month and repeat his labs th    Acute deep vein thrombosis (DVT) of popliteal vein of left lower extremity (CMS-HCC) 02/05/2019    Last Assessment & Plan:   Formatting of this note might be different from the original.  Moderate effusion left knee new the some limitation of motion.  No evidence of sepsis the white count the normal  will     Proceed on with an aspiration for cell count, culture and crystal analysis.  New x-rays ordered and currently pending will follow.  Further therapeutic recommendations contingent on the re    Anemia     Asthma     Candidal esophagitis (CMS-HCC) 07/30/2022    Community acquired pneumonia 07/30/2014    DVT (deep venous thrombosis) (CMS-HCC) 11/24/2013    Last Assessment & Plan:    Formatting of this note might be different from the original.   Appears to be doing better (swelling improved). He will f/u with PCP regarding coumadin dosing (INR is low).  Formatting of this note might be different from the original.   Last Assessment & Plan:    Formatting of this note might be different from the original.   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   greatly appreciate renal f/u.  Formatting of this note might be different from the original.   Last Assessment & Plan:    greatly appreciate renal f/u.   Last Assessment & Plan:    greatly appreciate renal f/u.  Formatting of this note might be different from the original.   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    Rectal bleeding 04/23/2019    Sleep  apnea        Immunosuppression regimen:  Tacrolimus 11 mg BID (dose as of morning of 7/25); goal 8-11 ng/mL (higher goal level in setting of HIV); also discharging with 1 mg capsules at this time    Mycophenolate mofetil 1000 mg BID-> converted to aza 150 mg daily on 7/22 due to esophagitis/gastritis (TPMT pending)    Prednisone 5 mg daily    Antimicrobials during admission:   CMV:  D-/R+   Discharge dose:  Valcyte 450 mg daily  Goal dose: Valcyte 450 mg daily  End date: 12/06/22  PJP:    Pentamidine x 1 on 7/11 and then monthly or transition to atovaquone outpatient . Anticipate 12 months of prophylaxis per transplant protocol. End date:  1 year post-transplant (new update): 09/05/23   *Atovaquone to start on 8/8  *Note patient with severe reaction following pentamidine dose administration inpatient - did receive full dose; unable to take bactrim due previous intolerance and not amenable to re-trial at this time  Anti-fungal:    Nystatin while inpatient  HIV: home biktarvy continued    Medication changes to be instituted upon discharge:  Pertinent new medications:  Heme: aspirin 81 mg daily  GI: docusate and Miralax PRN, pantoprazole 40 mg BID, carafate BID  Pain: gabapentin 300 mg TID, apap PRN   Lytes: Mag plus protein - on hold  CV: hydralazine 50 mg TID    Continued home medications:  Pulm: albuterol inhaler prn  ID: biktarvy as above    Changes in home medications:  CV: carvedilol increased from 25 mg BID to 50 mg BID, minoxidil 2.5 mg daily increased to 5 mg daily    Home medications stopped:  Endo: vitamin D supplements  CV: losartan   GI: calcium carbonate (tums)     Medication related barriers:  - 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 Per patient and patient's family the following medications are on FAA restriction list and are not be initiated: clonidine, guanabenz, gunafacine/Tenex, methyldopa, Nitrates (ex. Nitroglycerin, isosorbide), reserpine     Potential adverse effects during admission   YES NO   1 Neurotoxicity (tremor, paresthesias, tingling, seizures, or headache) []  [x]    2 Hypertension [x]  []    3 Hyperglycemia []  [x]        Suggested monitoring for outpatient follow-up:   - GI: EGD with severe esophagitis and severe stomach inflammation; sucralfate BID started per GI - patient counseled on separating sucralfate doses at least 4 hours apart from any other medications. Please follow-up on efficacy/utility of sucralfate on patient's symptoms and discontinue if/when able.     -CV: persistently hypertensive throughout admission with medication options limited due to allergies/intolerances and FAA restriction list. Carvedilol and minoxidil increased, hydralazine started.Patient with continuous request to transition off hydralazine when able due to reported intolerance.    Ca: Ca increasing throughout admission, patient history of hyperparathyroidism s/p parathyroidectomy; unable to take cinacalcet due to patient reported allergy. Calcium and vitamin D supplements discontinued, continue to monitor.    - Psych: consider psych follow-up in outpatient setting if patient amenable - numerous stressors expressed while inpatient and may benefit from additional community resources    - Allergies: consider allergy consult in outpatient setting to assess true allergies vs intolerances to medications - patient with a significant amount of medication allergies listed limiting medication options for numerous indications    Rance Muir, PharmD,  {Blank single:[id number]::RPh, PharmD, PharmD Candidate}

## 2022-09-15 NOTE — Unmapped (Signed)
Tacrolimus Therapeutic Monitoring Pharmacy Note    Christian Brown is a 42 y.o. male continuing tacrolimus.     Indication: Kidney transplant     Date of Transplant:  09/05/2022       Prior Dosing Information: Current regimen tacrolimus SL 5 mg BID.      Goals:  Therapeutic Drug Levels  Tacrolimus trough goal: 8-10 ng/mL    Additional Clinical Monitoring/Outcomes  Monitor renal function (SCr and urine output) and liver function (LFTs)  Monitor for signs/symptoms of adverse events (e.g., hyperglycemia, hyperkalemia, hypomagnesemia, hypertension, headache, tremor)    Results:   Tacrolimus level:  4.7 ng/mL, drawn appropriately    Pharmacokinetic Considerations and Significant Drug Interactions:  Concurrent hepatotoxic medications: None identified  Concurrent CYP3A4 substrates/inhibitors: None identified  Concurrent nephrotoxic medications: None identified    Assessment/Plan:  Recommendedation(s)  Increase to tacrolimus SL 5 mg BID    Follow-up  Daily levels currently ordered .   A pharmacist will continue to monitor and recommend levels as appropriate    Please page service pharmacist with questions/clarifications.    Rance Muir, PharmD

## 2022-09-15 NOTE — Unmapped (Signed)
Transplant Surgery Progress Note    Assessment:     Christian Brown is a 42 y.o. 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 Induction with Solumedrol, Thymoglobulin    Interval Events:   Again he reported an atypical chest pain, non compressive pain last night and the follow up Troponin was 86 and 72  (similar to his baseline troponin values) with some ECG changes similar to previous ECG.   Reported 8/10 abdominal pain. Appropriate UOP, 2x BM. The patient refuses taking some of his medication regardless of comprehensive explanation of the logic behind each medication's prescription including NS bolus and frusemide for his hypercalcemia.    Plan:     Neuro:   - Pain well controlled   - Tylenol (Sch/ PRN), Robaxin, Neurontin     CV:   - HDS, Maintain SBP < 180   - Coreg to 50 mg, home midoxinil 5 mg , hydralazine 25 mg TID and 50 stat    Pulm:   - Stable on room air  - Continue incentive spirometry, pulmonary toilet, out of bed as tolerated    GI:   - F: ML   - E: replace as needed    - N: regular diet   - pepcid/Protonix 40 BID , zofran, miralax   - GI consult  7/14: No EGD at this point Protonix BID, GI recommendation on their follow up visit : 7/19 was to have a repeated EGD in two months in an outpatient settings   -MMF was substituted by Myfortic for possible pill induced esophagitis 7/19   -GI pathology  09/14/2022 was followed up today and mentioned:   A: Esophagus, biopsy  - Acute ulcerative esophagitis with granulation tissue and extensive fibrinopurulent debris with intermixed polarizable and non-polarizable foreign material, cannot exclude pill esophagitis  - No viral cytopathic effect identified by H&E, CMV, or HSV I+II stains  - No fungal organisms identified by H&E or GMS stain  - No dysplasia identified     B: Stomach, biopsy  - Oxyntic and antral-type mucosa with acute erosive gastropathy and associated mild acute and chronic inflammation  - No viral cytopathic effect identified by H&E or CMV immunostain  - No Helicobacter organisms identified by H&E or immunostain  - No intestinal metaplasia or dysplasia identified  - Iron stain is negative     - KUB 7/14: moderate colonic stool burden, no obstruction   -EGD ON 7/16:  was performed for him and the results revealed severe esophagitis and gastritis with no active bleeding lesion. So Protonix IV was started for him in addition to Carafate. A 10mm non-bleeding linear gastric ulcer.     GU:   - Passed TOV, voiding spontaneously.    -Drain was removed, had 55 cc discharge: serosanguinous       Endo:   - no acute issues    -has primary  parathyroidism-- Sensipar was started     Heme/ID:   - Afebrile, WBC and Hgb stable   - ppx with nystatin, valcyte, PENTAM    Immuno:   - tac, cellcept, Solumedrol  - pharmacy recommendations for dosing   - HIV+ on BYCTARVY regiment     Dispo   - Floor status        Objective:        Vital Signs:  BP 142/78  - Pulse 86  - Temp 37 ??C (98.6 ??F) (Oral)  - Resp 18  -  Ht 170.2 cm (5' 7.01)  - Wt 75.7 kg (166 lb 14.2 oz)  - SpO2 100%  - BMI 26.13 kg/m??     Input/Output:  I/O last 3 completed shifts:  In: 395 [P.O.:395]  Out: 0       Intake/Output Summary (Last 24 hours) at 09/15/2022 1351  Last data filed at 09/14/2022 1500  Gross per 24 hour   Intake --   Output 0 ml   Net 0 ml        BM: 1X   URINE :  non documented ( 7 x urination)      Physical Exam:    General: Cooperative, no distress, well appearing male  Pulmonary: Normal work of breathing,   Cardiovascular: Regular rate, NSR  Abdomen: Soft, non-tender, non-distended. Surgical incisions and drain sites without signs of infection. Drain with ss output. Well healing.   Musculoskeletal: Moves extremities spontaneously   Neurologic: Alert and interactive, grossly intact    Labs:  Lab Results   Component Value Date    WBC 4.1 09/15/2022    HGB 11.6 (L) 09/15/2022    HCT 36.3 (L) 09/15/2022    PLT 140 (L) 09/15/2022 Lab Results   Component Value Date    NA 135 09/15/2022    K 4.5 09/15/2022    CL 108 (H) 09/15/2022    CO2 21.0 09/15/2022    BUN 17 09/15/2022    CREATININE 1.91 (H) 09/15/2022    GLU 86 09/15/2022    CALCIUM 11.1 (H) 09/15/2022    MG 2.1 09/15/2022    PHOS 2.3 (L) 09/15/2022       Lab Results   Component Value Date    BILITOT 0.8 09/05/2022    BILIDIR 0.20 01/17/2022    PROT 8.0 09/05/2022    ALBUMIN 3.6 09/05/2022    ALT <7 (L) 09/05/2022    AST 14 09/05/2022    ALKPHOS 249 (H) 09/05/2022    GGT 15 09/05/2022       Lab Results   Component Value Date    PT 10.9 09/05/2022    INR 0.97 09/05/2022    APTT 39.9 (H) 09/05/2022      Tac level: 4.6-- 4.6 today  Ionized ca: 5.8-- 6.15 today    Total calcium: 7.4      Microbiology Results (last day)       Procedure Component Value Date/Time Date/Time    COVID-19 PCR [[id number]]  (Normal) Collected: 09/05/22 1145    Lab Status: Final result Specimen: Nasopharyngeal Swab Updated: 09/05/22 1258     SARS-CoV-2 PCR Negative    Narrative:      This test was performed using the Cepheid Xpert Xpress SARS-CoV-2 assay which has been validated by the CLIA-certified, CAP-inspected [facility]. FDA has granted Emergency Use Authorization for this test. This real-time RT-PCR test detects SARS-CoV-2 by targeting the N2 and E genes. Negative results do not preclude SARS-CoV-2 infection and should not be used as the sole basis for patient management decisions. Negative results must be combined with clinical observations, patient history, and epidemiological information. Information for providers and patients can be found here: [email or web address]      Urine Culture [[id number]]     Lab Status: No result Specimen: Urine from Clean Catch             Imaging:  All pertinent imaging personally reviewed.      Original note written by Chalmers Cater. I attest that I have reviewed the  student note and that the components of the HPI, PE, and A/P documented were performed by me or were performed in my presence by the student where I verified the documentation and performed (or reperformed) the exam and medical decision making.      Bernestine Amass (MD)  General Surgery Resident  PGY1

## 2022-09-15 NOTE — Unmapped (Signed)
Set up for welcome/onboarding to [facility] next week.

## 2022-09-16 LAB — CBC
HEMATOCRIT: 30.2 % — ABNORMAL LOW (ref 39.0–48.0)
HEMOGLOBIN: 9.8 g/dL — ABNORMAL LOW (ref 12.9–16.5)
MEAN CORPUSCULAR HEMOGLOBIN CONC: 32.5 g/dL (ref 32.0–36.0)
MEAN CORPUSCULAR HEMOGLOBIN: 30.1 pg (ref 25.9–32.4)
MEAN CORPUSCULAR VOLUME: 92.7 fL (ref 77.6–95.7)
MEAN PLATELET VOLUME: 8.4 fL (ref 6.8–10.7)
PLATELET COUNT: 179 10*9/L (ref 150–450)
RED BLOOD CELL COUNT: 3.26 10*12/L — ABNORMAL LOW (ref 4.26–5.60)
RED CELL DISTRIBUTION WIDTH: 18.9 % — ABNORMAL HIGH (ref 12.2–15.2)
WBC ADJUSTED: 4.3 10*9/L (ref 3.6–11.2)

## 2022-09-16 LAB — PHOSPHORUS: PHOSPHORUS: 2.3 mg/dL — ABNORMAL LOW (ref 2.4–5.1)

## 2022-09-16 LAB — BASIC METABOLIC PANEL
ANION GAP: 6 mmol/L (ref 5–14)
BLOOD UREA NITROGEN: 17 mg/dL (ref 9–23)
BUN / CREAT RATIO: 8
CALCIUM: 10.9 mg/dL — ABNORMAL HIGH (ref 8.7–10.4)
CHLORIDE: 109 mmol/L — ABNORMAL HIGH (ref 98–107)
CO2: 22 mmol/L (ref 20.0–31.0)
CREATININE: 2.05 mg/dL — ABNORMAL HIGH
EGFR CKD-EPI (2021) MALE: 41 mL/min/{1.73_m2} — ABNORMAL LOW (ref >=60)
GLUCOSE RANDOM: 83 mg/dL (ref 70–179)
POTASSIUM: 4.3 mmol/L (ref 3.4–4.8)
SODIUM: 137 mmol/L (ref 135–145)

## 2022-09-16 LAB — MAGNESIUM: MAGNESIUM: 1.9 mg/dL (ref 1.6–2.6)

## 2022-09-16 LAB — TACROLIMUS LEVEL, TROUGH: TACROLIMUS, TROUGH: 5.8 ng/mL (ref 5.0–15.0)

## 2022-09-16 LAB — IONIZED CALCIUM VENOUS: CALCIUM IONIZED VENOUS (MG/DL): 6.38 mg/dL — ABNORMAL HIGH (ref 4.40–5.40)

## 2022-09-16 MED ADMIN — minoxidil (LONITEN) tablet 5 mg: 5 mg | ORAL | @ 12:00:00 | NDC 72789027901

## 2022-09-16 MED ADMIN — pantoprazole (Protonix) injection 40 mg: 40 mg | INTRAVENOUS | @ 01:00:00 | NDC 82009001190

## 2022-09-16 MED ADMIN — diphenhydrAMINE (BENADRYL) injection 25 mg: 25 mg | INTRAVENOUS | @ 02:00:00 | NDC 97807007030

## 2022-09-16 MED ADMIN — HYDROmorphone (PF) injection Syrg 0.5 mg: .5 mg | INTRAVENOUS | @ 04:00:00 | Stop: 2022-09-15 | NDC 00409426411

## 2022-09-16 MED ADMIN — oxyCODONE (ROXICODONE) immediate release tablet 10 mg: 10 mg | ORAL | @ 17:00:00 | Stop: 2022-09-20 | NDC 71335142204

## 2022-09-16 MED ADMIN — acetaminophen (TYLENOL) tablet 1,000 mg: 1000 mg | ORAL | @ 11:00:00 | Stop: 2022-09-19 | NDC 53191040901

## 2022-09-16 MED ADMIN — acetaminophen (TYLENOL) tablet 1,000 mg: 1000 mg | ORAL | @ 17:00:00 | Stop: 2022-09-19 | NDC 53191040901

## 2022-09-16 MED ADMIN — hydrALAZINE (APRESOLINE) tablet 25 mg: 25 mg | ORAL | @ 01:00:00 | NDC 77771018310

## 2022-09-16 MED ADMIN — pantoprazole (Protonix) injection 40 mg: 40 mg | INTRAVENOUS | @ 12:00:00 | NDC 82009001190

## 2022-09-16 MED ADMIN — bictegrav-emtricit-tenofov ala (BIKTARVY) 50-200-25 mg tablet 1 tablet: 1 | ORAL | @ 12:00:00 | NDC 61958250103

## 2022-09-16 MED ADMIN — valGANciclovir (VALCYTE) tablet 450 mg: 450 mg | ORAL | @ 12:00:00 | NDC 69097027703

## 2022-09-16 MED ADMIN — hydrALAZINE (APRESOLINE) tablet 25 mg: 25 mg | ORAL | @ 17:00:00 | NDC 77771018310

## 2022-09-16 MED ADMIN — lidocaine 4 % patch 1 patch: 1 | TRANSDERMAL | @ 12:00:00 | NDC 54868389400

## 2022-09-16 MED ADMIN — diphenhydrAMINE (BENADRYL) injection: 25 mg | INTRAVENOUS | @ 12:00:00 | NDC 57866728801

## 2022-09-16 MED ADMIN — carvedilol (COREG) tablet 50 mg: 50 mg | ORAL | @ 01:00:00 | NDC 82009012805

## 2022-09-16 MED ADMIN — mycophenolate (MYFORTIC) EC tablet 720 mg: 720 mg | ORAL | @ 23:00:00 | NDC 72819015508

## 2022-09-16 MED ADMIN — hydrALAZINE (APRESOLINE) tablet 25 mg: 25 mg | ORAL | @ 11:00:00 | NDC 77771018310

## 2022-09-16 MED ADMIN — predniSONE (DELTASONE) tablet 5 mg: 5 mg | ORAL | @ 12:00:00 | NDC 55289085902

## 2022-09-16 MED ADMIN — oxyCODONE (ROXICODONE) immediate release tablet 10 mg: 10 mg | ORAL | @ 01:00:00 | Stop: 2022-09-20 | NDC 71335142204

## 2022-09-16 MED ADMIN — sucralfate (CARAFATE) oral suspension: 1 g | ORAL | @ 20:00:00 | NDC 72578008105

## 2022-09-16 MED ADMIN — tacrolimus (PROGRAF) capsule 5 mg: 5 mg | SUBLINGUAL | @ 23:00:00 | NDC 00469301601

## 2022-09-16 MED ADMIN — oxyCODONE (ROXICODONE) immediate release tablet 10 mg: 10 mg | ORAL | @ 07:00:00 | Stop: 2022-09-20 | NDC 71335142204

## 2022-09-16 MED ADMIN — mycophenolate (MYFORTIC) EC tablet 720 mg: 720 mg | ORAL | @ 11:00:00 | NDC 72819015508

## 2022-09-16 MED ADMIN — diphenhydrAMINE (BENADRYL) injection 25 mg: 25 mg | INTRAVENOUS | @ 07:00:00 | NDC 97807007030

## 2022-09-16 MED ADMIN — diphenhydrAMINE (BENADRYL) capsule/tablet 25 mg: 25 mg | ORAL | @ 17:00:00 | NDC 97807007030

## 2022-09-16 MED ADMIN — heparin (porcine) 5,000 unit/mL injection 5,000 Units: 5000 [IU] | SUBCUTANEOUS | @ 17:00:00 | NDC 00009029101

## 2022-09-16 MED ADMIN — tacrolimus (PROGRAF) capsule 5 mg: 5 mg | SUBLINGUAL | @ 11:00:00 | NDC 00469301601

## 2022-09-16 MED ADMIN — tacrolimus (PROGRAF) capsule 5 mg: 5 mg | ORAL | @ 17:00:00 | Stop: 2022-09-16 | NDC 00469301601

## 2022-09-16 MED ADMIN — carvedilol (COREG) tablet 50 mg: 50 mg | ORAL | @ 12:00:00 | NDC 82009012805

## 2022-09-16 MED ADMIN — sucralfate (CARAFATE) oral suspension: 1 g | ORAL | @ 01:00:00 | NDC 72578008105

## 2022-09-16 MED ADMIN — acetaminophen (TYLENOL) tablet 1,000 mg: 1000 mg | ORAL | @ 01:00:00 | Stop: 2022-09-19 | NDC 53191040901

## 2022-09-16 NOTE — Unmapped (Signed)
Transplant Surgery Progress Note    Assessment:     Christian Brown is a 42 y.o. 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 Induction with Solumedrol, Thymoglobulin    Interval Events:   Pain over incision overnight, 0.5 HM given. However unwilling to allow [place] to examine incision site this AM. Itching ON, given benadryl. HDS, afebrile. UOP appropriate, though difficult to measure as patient does not save urine for nursing to measure. Received PO tac 5 this Am instead of sublingual tac, patient refused extra 2.5 SL tac but agreed to have another 5 PO to ensure proper dosing.     Plan:     Neuro:   - Pain well controlled   - Tylenol (Sch/ PRN), Robaxin, Neurontin     CV:   - HDS, Maintain SBP < 180   - Coreg to 50 mg, home midoxinil 5 mg , hydralazine 25 mg TID     Pulm:   - Stable on room air  - Continue incentive spirometry, pulmonary toilet, out of bed as tolerated    GI:   - F: ML   - E: replace as needed    - N: regular diet   - Protonix 40 BID , zofran, miralax, pepcid   - GI consult  7/14: No EGD at this point Protonix BID, GI recommendation on their follow up visit : 7/19 was to have a repeated EGD in two months in an outpatient settings   -MMF was substituted by Myfortic for possible pill induced esophagitis 7/19   -GI pathology  09/14/2022 was followed up today and mentioned:     - Negative for CMV and candida esophagitis    - Thought to be pill esophagitis per GI   - KUB 7/14: moderate colonic stool burden, no obstruction   - EGD ON 7/16:  was performed for him and the results revealed severe esophagitis and gastritis with no active bleeding lesion. So Protonix IV was started for him in addition to Carafate. A 10mm non-bleeding linear gastric ulcer.     GU:   - Passed TOV, voiding spontaneously.    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     Input/Output:  I/O last 3 completed shifts:  In: 240 [P.O.:240]  Out: -       Intake/Output Summary (Last 24 hours) at 09/16/2022 8119  Last data filed at 09/15/2022 1442  Gross per 24 hour   Intake 240 ml   Output --   Net 240 ml  BM: 1X   URINE :  non documented ( 7 x urination)      Physical Exam:    General: Cooperative, no distress, well appearing male  Pulmonary: Normal work of breathing  Cardiovascular: Regular rate, NSR  Abdomen: Unable to examine today, patient refused  Musculoskeletal: Moves extremities spontaneously   Neurologic: Alert and interactive, grossly intact    Labs:  Lab Results   Component Value Date    WBC 4.3 09/16/2022    HGB 9.8 (L) 09/16/2022    HCT 30.2 (L) 09/16/2022    PLT 179 09/16/2022       Lab Results   Component Value Date    NA 137 09/16/2022    K 4.3 09/16/2022    CL 109 (H) 09/16/2022    CO2 22.0 09/16/2022    BUN 17 09/16/2022    CREATININE 2.05 (H) 09/16/2022    GLU 83 09/16/2022    CALCIUM 10.9 (H) 09/16/2022    MG 1.9 09/16/2022    PHOS 2.3 (L) 09/16/2022       Lab Results   Component Value Date    BILITOT 0.8 09/05/2022    BILIDIR 0.20 01/17/2022    PROT 8.0 09/05/2022    ALBUMIN 3.6 09/05/2022    ALT <7 (L) 09/05/2022    AST 14 09/05/2022    ALKPHOS 249 (H) 09/05/2022    GGT 15 09/05/2022       Lab Results   Component Value Date    PT 10.9 09/05/2022    INR 0.97 09/05/2022    APTT 39.9 (H) 09/05/2022      Tac level: 4.6-- 4.6 today  Ionized ca: 5.8-- 6.15 today    Total calcium: 7.4      Microbiology Results (last day)       Procedure Component Value Date/Time Date/Time    COVID-19 PCR [[id number]]  (Normal) Collected: 09/05/22 1145    Lab Status: Final result Specimen: Nasopharyngeal Swab Updated: 09/05/22 1258     SARS-CoV-2 PCR Negative    Narrative:      This test was performed using the Cepheid Xpert Xpress SARS-CoV-2 assay which has been validated by the CLIA-certified, CAP-inspected [facility] Clinical Molecular Microbiology Laboratory. FDA has granted Emergency Use Authorization for this test. This real-time RT-PCR test detects SARS-CoV-2 by targeting the N2 and E genes. Negative results do not preclude SARS-CoV-2 infection and should not be used as the sole basis for patient management decisions. Negative results must be combined with clinical observations, patient history, and epidemiological information. Information for providers and patients can be found here: [email or web address]      Urine Culture [[id number]]     Lab Status: No result Specimen: Urine from Clean Catch             Imaging:  All pertinent imaging personally reviewed.

## 2022-09-16 NOTE — Unmapped (Signed)
Pt reports increased pain to RLQ incision as well a small lump underneath incision. MD up to view. One time dose of diluadid given for pain not controlled by oxycodone and tylenol. Also given 2 doses of benedryl for itching from unknown cause. No rash noted. No new medications given. Out of bed independently to bathroom to void. Reports frequent small amounts of urine. Does not save urine for measuring. Reports BM yesterday and did not want laxatives.   Problem: Wound  Goal: Optimal Coping  Outcome: Progressing  Goal: Optimal Functional Ability  Outcome: Progressing  Intervention: Optimize Functional Ability  Recent Flowsheet Documentation  Taken 09/16/2022 0000 by Sherril Cong, RN  Activity Management:   up ad lib   ambulated to bathroom  Taken 09/15/2022 2200 by Sherril Cong, RN  Activity Management: up ad lib  Taken 09/15/2022 2000 by Sherril Cong, RN  Activity Management: ambulated to bathroom  Goal: Absence of Infection Signs and Symptoms  Outcome: Progressing  Intervention: Prevent or Manage Infection  Recent Flowsheet Documentation  Taken 09/16/2022 0000 by Sherril Cong, RN  Isolation Precautions: protective precautions maintained  Taken 09/15/2022 2200 by Sherril Cong, RN  Isolation Precautions: protective precautions maintained  Taken 09/15/2022 2000 by Sherril Cong, RN  Isolation Precautions: protective precautions maintained  Goal: Improved Oral Intake  Outcome: Progressing  Goal: Optimal Pain Control and Function  Outcome: Progressing  Goal: Skin Health and Integrity  Outcome: Progressing  Intervention: Optimize Skin Protection  Recent Flowsheet Documentation  Taken 09/16/2022 0000 by Sherril Cong, RN  Activity Management:   up ad lib   ambulated to bathroom  Pressure Reduction Techniques: frequent weight shift encouraged  Pressure Reduction Devices: pressure-redistributing mattress utilized  Taken 09/15/2022 2200 by Sherril Cong, RN  Activity Management: up ad lib  Taken 09/15/2022 2000 by Sherril Cong, RN  Activity Management: ambulated to bathroom  Pressure Reduction Techniques: frequent weight shift encouraged  Pressure Reduction Devices: pressure-redistributing mattress utilized  Goal: Optimal Wound Healing  Outcome: Progressing     Problem: Adult Inpatient Plan of Care  Goal: Plan of Care Review  Outcome: Progressing  Goal: Patient-Specific Goal (Individualized)  Outcome: Progressing  Goal: Absence of Hospital-Acquired Illness or Injury  Outcome: Progressing  Intervention: Identify and Manage Fall Risk  Recent Flowsheet Documentation  Taken 09/16/2022 0000 by Sherril Cong, RN  Safety Interventions:   low bed   family at bedside   isolation precautions   neutropenic precautions   no IV/BP/blood draw left arm   nonskid shoes/slippers when out of bed  Taken 09/15/2022 2200 by Sherril Cong, RN  Safety Interventions:   low bed   fall reduction program maintained   isolation precautions   neutropenic precautions   no IV/BP/blood draw left arm   nonskid shoes/slippers when out of bed  Taken 09/15/2022 2000 by Sherril Cong, RN  Safety Interventions:   low bed   fall reduction program maintained   family at bedside   isolation precautions   neutropenic precautions   no IV/BP/blood draw left arm   nonskid shoes/slippers when out of bed  Intervention: Prevent Skin Injury  Recent Flowsheet Documentation  Taken 09/16/2022 0000 by Sherril Cong, RN  Positioning for Skin: Left  Taken 09/15/2022 2200 by Sherril Cong, RN  Positioning for Skin: Standing  Taken 09/15/2022 2000 by Sherril Cong, RN  Positioning for Skin: Left  Intervention: Prevent and Manage VTE (Venous Thromboembolism) Risk  Recent  Flowsheet Documentation  Taken 09/15/2022 2045 by Sherril Cong, RN  VTE Prevention/Management:   anticoagulant therapy   ambulation promoted  Goal: Optimal Comfort and Wellbeing  Outcome: Progressing  Goal: Readiness for Transition of Care  Outcome: Progressing  Goal: Rounds/Family Conference  Outcome: Progressing     Problem: Fall Injury Risk  Goal: Absence of Fall and Fall-Related Injury  Outcome: Progressing  Intervention: Promote Scientist, clinical (histocompatibility and immunogenetics) Documentation  Taken 09/16/2022 0000 by Sherril Cong, RN  Safety Interventions:   low bed   family at bedside   isolation precautions   neutropenic precautions   no IV/BP/blood draw left arm   nonskid shoes/slippers when out of bed  Taken 09/15/2022 2200 by Sherril Cong, RN  Safety Interventions:   low bed   fall reduction program maintained   isolation precautions   neutropenic precautions   no IV/BP/blood draw left arm   nonskid shoes/slippers when out of bed  Taken 09/15/2022 2000 by Sherril Cong, RN  Safety Interventions:   low bed   fall reduction program maintained   family at bedside   isolation precautions   neutropenic precautions   no IV/BP/blood draw left arm   nonskid shoes/slippers when out of bed     Problem: Infection  Goal: Absence of Infection Signs and Symptoms  Outcome: Progressing  Intervention: Prevent or Manage Infection  Recent Flowsheet Documentation  Taken 09/16/2022 0000 by Sherril Cong, RN  Isolation Precautions: protective precautions maintained  Taken 09/15/2022 2200 by Sherril Cong, RN  Isolation Precautions: protective precautions maintained  Taken 09/15/2022 2000 by Sherril Cong, RN  Isolation Precautions: protective precautions maintained     Problem: Self-Care Deficit  Goal: Improved Ability to Complete Activities of Daily Living  Outcome: Progressing

## 2022-09-17 LAB — CBC
HEMATOCRIT: 33.5 % — ABNORMAL LOW (ref 39.0–48.0)
HEMOGLOBIN: 10.7 g/dL — ABNORMAL LOW (ref 12.9–16.5)
MEAN CORPUSCULAR HEMOGLOBIN CONC: 32 g/dL (ref 32.0–36.0)
MEAN CORPUSCULAR HEMOGLOBIN: 29.8 pg (ref 25.9–32.4)
MEAN CORPUSCULAR VOLUME: 93.2 fL (ref 77.6–95.7)
MEAN PLATELET VOLUME: 7.9 fL (ref 6.8–10.7)
PLATELET COUNT: 212 10*9/L (ref 150–450)
RED BLOOD CELL COUNT: 3.6 10*12/L — ABNORMAL LOW (ref 4.26–5.60)
RED CELL DISTRIBUTION WIDTH: 19.3 % — ABNORMAL HIGH (ref 12.2–15.2)
WBC ADJUSTED: 5.9 10*9/L (ref 3.6–11.2)

## 2022-09-17 LAB — BASIC METABOLIC PANEL
ANION GAP: 5 mmol/L (ref 5–14)
BLOOD UREA NITROGEN: 13 mg/dL (ref 9–23)
BUN / CREAT RATIO: 7
CALCIUM: 11.4 mg/dL — ABNORMAL HIGH (ref 8.7–10.4)
CHLORIDE: 111 mmol/L — ABNORMAL HIGH (ref 98–107)
CO2: 22 mmol/L (ref 20.0–31.0)
CREATININE: 1.96 mg/dL — ABNORMAL HIGH
EGFR CKD-EPI (2021) MALE: 44 mL/min/{1.73_m2} — ABNORMAL LOW (ref >=60)
GLUCOSE RANDOM: 89 mg/dL (ref 70–179)
POTASSIUM: 4.7 mmol/L (ref 3.4–4.8)
SODIUM: 138 mmol/L (ref 135–145)

## 2022-09-17 LAB — PROTEIN / CREATININE RATIO, URINE
CREATININE, URINE: 182 mg/dL
PROTEIN URINE: 64.8 mg/dL
PROTEIN/CREAT RATIO, URINE: 0.356

## 2022-09-17 LAB — MAGNESIUM: MAGNESIUM: 1.9 mg/dL (ref 1.6–2.6)

## 2022-09-17 LAB — IONIZED CALCIUM VENOUS: CALCIUM IONIZED VENOUS (MG/DL): 6.38 mg/dL — ABNORMAL HIGH (ref 4.40–5.40)

## 2022-09-17 LAB — PHOSPHORUS: PHOSPHORUS: 2.7 mg/dL (ref 2.4–5.1)

## 2022-09-17 LAB — TACROLIMUS LEVEL, TROUGH: TACROLIMUS, TROUGH: 6.5 ng/mL (ref 5.0–15.0)

## 2022-09-17 MED ADMIN — oxyCODONE (ROXICODONE) immediate release tablet 10 mg: 10 mg | ORAL | @ 14:00:00 | Stop: 2022-09-20 | NDC 71335142204

## 2022-09-17 MED ADMIN — lidocaine 4 % patch 1 patch: 1 | TRANSDERMAL | @ 13:00:00 | NDC 54868389400

## 2022-09-17 MED ADMIN — acetaminophen (TYLENOL) tablet 1,000 mg: 1000 mg | ORAL | @ 11:00:00 | Stop: 2022-09-19 | NDC 53191040901

## 2022-09-17 MED ADMIN — carvedilol (COREG) tablet 50 mg: 50 mg | ORAL | @ 01:00:00 | NDC 82009012805

## 2022-09-17 MED ADMIN — mycophenolate (MYFORTIC) EC tablet 720 mg: 720 mg | ORAL | @ 11:00:00 | Stop: 2022-09-17 | NDC 72819015508

## 2022-09-17 MED ADMIN — acetaminophen (TYLENOL) tablet 1,000 mg: 1000 mg | ORAL | @ 01:00:00 | Stop: 2022-09-19 | NDC 53191040901

## 2022-09-17 MED ADMIN — minoxidil (LONITEN) tablet 5 mg: 5 mg | ORAL | @ 13:00:00 | NDC 72789027901

## 2022-09-17 MED ADMIN — hydrALAZINE (APRESOLINE) tablet 25 mg: 25 mg | ORAL | @ 01:00:00 | NDC 77771018310

## 2022-09-17 MED ADMIN — tacrolimus (PROGRAF) capsule 5 mg: 5 mg | SUBLINGUAL | @ 11:00:00 | Stop: 2022-09-17 | NDC 00469301601

## 2022-09-17 MED ADMIN — carvedilol (COREG) tablet 50 mg: 50 mg | ORAL | @ 13:00:00 | NDC 82009012805

## 2022-09-17 MED ADMIN — mycophenolate (CELLCEPT) tablet 1,000 mg: 1000 mg | ORAL | @ 22:00:00 | NDC 53191040901

## 2022-09-17 MED ADMIN — pantoprazole (Protonix) injection 40 mg: 40 mg | INTRAVENOUS | @ 13:00:00 | NDC 82009001190

## 2022-09-17 MED ADMIN — diphenhydrAMINE (BENADRYL) injection: 25 mg | INTRAVENOUS | @ 02:00:00 | NDC 57866728801

## 2022-09-17 MED ADMIN — acetaminophen (TYLENOL) tablet 1,000 mg: 1000 mg | ORAL | @ 19:00:00 | Stop: 2022-09-19 | NDC 53191040901

## 2022-09-17 MED ADMIN — hydrALAZINE (APRESOLINE) tablet 25 mg: 25 mg | ORAL | @ 19:00:00 | NDC 77771018310

## 2022-09-17 MED ADMIN — predniSONE (DELTASONE) tablet 5 mg: 5 mg | ORAL | @ 13:00:00 | NDC 55289085902

## 2022-09-17 MED ADMIN — sucralfate (CARAFATE) oral suspension: 1 g | ORAL | @ 01:00:00 | NDC 72578008105

## 2022-09-17 MED ADMIN — pantoprazole (Protonix) injection 40 mg: 40 mg | INTRAVENOUS | @ 01:00:00 | NDC 82009001190

## 2022-09-17 MED ADMIN — oxyCODONE (ROXICODONE) immediate release tablet 10 mg: 10 mg | ORAL | @ 11:00:00 | Stop: 2022-09-20 | NDC 71335142204

## 2022-09-17 MED ADMIN — diphenhydrAMINE (BENADRYL) injection 25 mg: 25 mg | INTRAVENOUS | @ 01:00:00 | NDC 97807007030

## 2022-09-17 MED ADMIN — valGANciclovir (VALCYTE) tablet 450 mg: 450 mg | ORAL | @ 13:00:00 | NDC 69097027703

## 2022-09-17 MED ADMIN — HYDROmorphone (PF) injection Syrg 0.5 mg: .5 mg | INTRAVENOUS | @ 04:00:00 | Stop: 2022-09-17 | NDC 00409426411

## 2022-09-17 MED ADMIN — hydrALAZINE (APRESOLINE) tablet 25 mg: 25 mg | ORAL | @ 11:00:00 | NDC 77771018310

## 2022-09-17 MED ADMIN — oxyCODONE (ROXICODONE) immediate release tablet 10 mg: 10 mg | ORAL | @ 19:00:00 | Stop: 2022-09-20 | NDC 71335142204

## 2022-09-17 MED ADMIN — tacrolimus (PROGRAF) capsule 10 mg: 10 mg | ORAL | @ 22:00:00 | NDC 00469301601

## 2022-09-17 MED ADMIN — bictegrav-emtricit-tenofov ala (BIKTARVY) 50-200-25 mg tablet 1 tablet: 1 | ORAL | @ 13:00:00 | NDC 61958250103

## 2022-09-17 MED ADMIN — oxyCODONE (ROXICODONE) immediate release tablet 10 mg: 10 mg | ORAL | @ 01:00:00 | Stop: 2022-09-20 | NDC 71335142204

## 2022-09-17 MED ADMIN — diphenhydrAMINE (BENADRYL) injection: 25 mg | INTRAVENOUS | @ 08:00:00 | NDC 57866728801

## 2022-09-17 NOTE — Unmapped (Signed)
Transplant Surgery Progress Note    Assessment:     Christian Brown is a 42 y.o. 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 Induction with Solumedrol, Thymoglobulin    Interval Events:   Patient complained of pain overnight, rejected all multimodal pain control offered and requested dilaudid. Screamed in room when not given and called his transplant coordinator who then called the nursing station about patient's pain. Of note, patient did not allow team to check his incision 7/20, see progress note from transplant surgery. He did receive 0.5 dilaudid overnight after the events mentioned above. Otherwise HDS. UOP difficult to assess as patient voids urine without altering his nurse. We discussed giving urine sample to nursing staff for [facility] and patient mentioned that it not an issue.  Patient mentions that myfortic gives him hives and makes him itchy and wants to change back to Cellcept. We discussed that we changed to myfortic due to his esophagitis since Cellcept can contribute to his heartburn sxs but patient still wants to change back to CellCept. He also wants to change his tacrolimus back to a pill/PO instead of sublingual since he says that the sublingual is difficult to tolerate d/t taste. We again discussed that the change was done to help with his heartburn sxs and again the patient confirmed he prefers the PO/pill version of tacrolimus regardless.     Plan:     Neuro:   - Sch Tylenol Robaxin, Neurontin   - PRN Oxy   - PRN Benadryl for itching     CV:   - HDS, Maintain SBP < 180   - Coreg to 50 mg, home midoxinil 5 mg , hydralazine 25 mg TID     Pulm:   - Stable on room air  - Continue incentive spirometry, pulmonary toilet, out of bed as tolerated    GI:   - F: ML   - E: replace as needed    - N: regular diet   - Protonix 40 BID , zofran, miralax, senna   - GI consult  7/14: No EGD at this point Protonix BID, GI recommendation on their follow up visit : 7/19 was to have a repeated EGD in two months in an outpatient settings   -MMF was substituted by Myfortic for possible pill induced esophagitis 7/19   -GI pathology  09/14/2022 was followed up today and mentioned:     - Negative for CMV and candida esophagitis    - Thought to be pill esophagitis per GI   - KUB 7/14: moderate colonic stool burden, no obstruction   - EGD ON 7/16:  was performed for him and the results revealed severe esophagitis and gastritis with no active bleeding lesion. So Protonix IV was started for him in addition to Carafate. A 10mm non-bleeding linear gastric ulcer.     GU:   - Passed TOV, voiding spontaneously.    - UPC today, follow    Endo:   - no acute issues    - has primary  parathyroidism   - Considered Sensipar but patient reports allergy   - Considered NS bolus and IV lasix but patient refused   - iCal 6.38 today     Heme/ID:   - Afebrile, WBC and Hgb stable   - ppx with nystatin, valcyte    Immuno:   - tac, cellcept, prednisone    - Switched from Myfortic to Cellcept today per patient  preference    - Switched from sublingual tacrolimus to PO/pill per patient preference  - pharmacy recommendations for dosing   - HIV+ on BYCTARVY regiment     Dispo   - Floor status        Objective:        Vital Signs:  BP 151/92  - Pulse 76  - Temp 36.7 ??C (98.1 ??F) (Oral)  - Resp 18  - Ht 170.2 cm (5' 7.01)  - Wt 75.7 kg (166 lb 14.2 oz)  - SpO2 100%  - BMI 26.13 kg/m??     Input/Output:  I/O last 3 completed shifts:  In: 1020 [P.O.:1020]  Out: -       Intake/Output Summary (Last 24 hours) at 09/17/2022 1610  Last data filed at 09/16/2022 2348  Gross per 24 hour   Intake 1020 ml   Output --   Net 1020 ml        BM: 1X   URINE :  non documented ( 7 x urination)      Physical Exam:     General: Intermittently cooperative, no distress, well appearing male  Pulmonary: Normal work of breathing  Cardiovascular: Regular rate, NSR  Abdomen: Non-tender, not distended, incision dry and clean, well healing. Some swelling around incision but no signs of fluid collection or infection.  Musculoskeletal: Moves extremities spontaneously   Neurologic: Alert, grossly intact    Labs:  Lab Results   Component Value Date    WBC 5.9 09/17/2022    HGB 10.7 (L) 09/17/2022    HCT 33.5 (L) 09/17/2022    PLT 212 09/17/2022       Lab Results   Component Value Date    NA 137 09/16/2022    K 4.3 09/16/2022    CL 109 (H) 09/16/2022    CO2 22.0 09/16/2022    BUN 17 09/16/2022    CREATININE 2.05 (H) 09/16/2022    GLU 83 09/16/2022    CALCIUM 10.9 (H) 09/16/2022    MG 1.9 09/16/2022    PHOS 2.3 (L) 09/16/2022       Lab Results   Component Value Date    BILITOT 0.8 09/05/2022    BILIDIR 0.20 01/17/2022    PROT 8.0 09/05/2022    ALBUMIN 3.6 09/05/2022    ALT <7 (L) 09/05/2022    AST 14 09/05/2022    ALKPHOS 249 (H) 09/05/2022    GGT 15 09/05/2022       Lab Results   Component Value Date    PT 10.9 09/05/2022    INR 0.97 09/05/2022    APTT 39.9 (H) 09/05/2022      Tac level: 4.6-- 4.6 today  Ionized ca: 5.8-- 6.15 today    Total calcium: 7.4      Microbiology Results (last day)       Procedure Component Value Date/Time Date/Time    COVID-19 PCR [[id number]]  (Normal) Collected: 09/05/22 1145    Lab Status: Final result Specimen: Nasopharyngeal Swab Updated: 09/05/22 1258     SARS-CoV-2 PCR Negative    Narrative:      This test was performed using the Cepheid Xpert Xpress SARS-CoV-2 assay which has been validated by the CLIA-certified, CAP-inspected [facility] Clinical Molecular Microbiology Laboratory. FDA has granted Emergency Use Authorization for this test. This real-time RT-PCR test detects SARS-CoV-2 by targeting the N2 and E genes. Negative results do not preclude SARS-CoV-2 infection and should not be used as the sole basis for patient management decisions.  Negative results must be combined with clinical observations, patient history, and epidemiological information. Information for providers and patients can be found here: [email or web address]      Urine Culture [[id number]]     Lab Status: No result Specimen: Urine from Clean Catch             Imaging:  All pertinent imaging personally reviewed.

## 2022-09-17 NOTE — Unmapped (Addendum)
VENOUS ACCESS TEAM PROCEDURE    Nurse request was placed for a PIV by Venous Access Team (VAT). This VAT RN assessed pt in room for PIV placement. After tourniquet applied,this RN was trying to feel the vein. Pt said  you are too tough. Don't touch that vein.  Pt has visible veins for PIV with tourniquet on. Explained the importance of keeping tourniquet on for PIV placement.  But pt refused PIV placement. Care RN reported that pt also refused her assessment for PIV. Pt has no medication due at this time per care RN. Care RN will place new order when pt is agreeable for PIV placement. Will send another VAT RN ( They are doing PICC in another pt's room at this time).      Workup / Procedure Time:  15 minutes       Care RN was notified.       Thank you,     Gillie Manners, RN Venous Access Team

## 2022-09-17 NOTE — Unmapped (Signed)
Patient paged on-call TNC while still admitted complaining of pain. Advised patient to inform his current nurse of pain level to further assist. Epic chat sent to Azalia Bilis, RN to further assist and page on-call nephrology if needed.

## 2022-09-17 NOTE — Unmapped (Signed)
Pt AO x4, VSS, NAD   Pt c/o 4/10 pain, given PRN oxy. Pt expressed pain was uncontrolled. MD notified. 1 time dose dilaudid ordered and administered    unknown UOP overnight--multiple episodes   C/o itching--given IV benadryl x2   Abd incision closed with staples, OTA, c/d/i   Ambulated in room independently, no assistive device, tolerated well   No falls/injuries this shift     Problem: Adult Inpatient Plan of Care  Goal: Plan of Care Review  Outcome: Ongoing - Unchanged  Goal: Patient-Specific Goal (Individualized)  Outcome: Ongoing - Unchanged  Goal: Absence of Hospital-Acquired Illness or Injury  Outcome: Ongoing - Unchanged  Intervention: Identify and Manage Fall Risk  Recent Flowsheet Documentation  Taken 09/16/2022 2000 by Raechel Ache, RN  Safety Interventions:   fall reduction program maintained   low bed   nonskid shoes/slippers when out of bed  Intervention: Prevent Skin Injury  Recent Flowsheet Documentation  Taken 09/16/2022 2000 by Raechel Ache, RN  Positioning for Skin: Supine/Back  Device Skin Pressure Protection: absorbent pad utilized/changed  Skin Protection: adhesive use limited  Intervention: Prevent and Manage VTE (Venous Thromboembolism) Risk  Recent Flowsheet Documentation  Taken 09/16/2022 2000 by Raechel Ache, RN  Anti-Embolism Device Type: SCD, Knee  Anti-Embolism Intervention: Refused  Intervention: Prevent Infection  Recent Flowsheet Documentation  Taken 09/16/2022 2000 by Raechel Ache, RN  Infection Prevention: cohorting utilized  Goal: Optimal Comfort and Wellbeing  Outcome: Ongoing - Unchanged  Goal: Readiness for Transition of Care  Outcome: Ongoing - Unchanged  Goal: Rounds/Family Conference  Outcome: Ongoing - Unchanged     Problem: Fall Injury Risk  Goal: Absence of Fall and Fall-Related Injury  Outcome: Ongoing - Unchanged  Intervention: Promote Injury-Free Environment  Recent Flowsheet Documentation  Taken 09/16/2022 2000 by Raechel Ache, RN  Safety Interventions:   fall reduction program maintained   low bed   nonskid shoes/slippers when out of bed

## 2022-09-18 DIAGNOSIS — Z01818 Encounter for other preprocedural examination: Principal | ICD-10-CM

## 2022-09-18 DIAGNOSIS — Z79899 Other long term (current) drug therapy: Principal | ICD-10-CM

## 2022-09-18 DIAGNOSIS — Z940 Kidney transplant status: Principal | ICD-10-CM

## 2022-09-18 LAB — BASIC METABOLIC PANEL
ANION GAP: 3 mmol/L — ABNORMAL LOW (ref 5–14)
BLOOD UREA NITROGEN: 14 mg/dL (ref 9–23)
BUN / CREAT RATIO: 7
CALCIUM: 11.3 mg/dL — ABNORMAL HIGH (ref 8.7–10.4)
CHLORIDE: 113 mmol/L — ABNORMAL HIGH (ref 98–107)
CO2: 23 mmol/L (ref 20.0–31.0)
CREATININE: 2.11 mg/dL — ABNORMAL HIGH
EGFR CKD-EPI (2021) MALE: 40 mL/min/{1.73_m2} — ABNORMAL LOW (ref >=60)
GLUCOSE RANDOM: 98 mg/dL (ref 70–179)
POTASSIUM: 5.2 mmol/L — ABNORMAL HIGH (ref 3.4–4.8)
SODIUM: 139 mmol/L (ref 135–145)

## 2022-09-18 LAB — CBC
HEMATOCRIT: 31.2 % — ABNORMAL LOW (ref 39.0–48.0)
HEMOGLOBIN: 10.1 g/dL — ABNORMAL LOW (ref 12.9–16.5)
MEAN CORPUSCULAR HEMOGLOBIN CONC: 32.5 g/dL (ref 32.0–36.0)
MEAN CORPUSCULAR HEMOGLOBIN: 30.3 pg (ref 25.9–32.4)
MEAN CORPUSCULAR VOLUME: 93.1 fL (ref 77.6–95.7)
MEAN PLATELET VOLUME: 8.5 fL (ref 6.8–10.7)
PLATELET COUNT: 194 10*9/L (ref 150–450)
RED BLOOD CELL COUNT: 3.35 10*12/L — ABNORMAL LOW (ref 4.26–5.60)
RED CELL DISTRIBUTION WIDTH: 19 % — ABNORMAL HIGH (ref 12.2–15.2)
WBC ADJUSTED: 6.1 10*9/L (ref 3.6–11.2)

## 2022-09-18 LAB — IONIZED CALCIUM VENOUS: CALCIUM IONIZED VENOUS (MG/DL): 6.99 mg/dL (ref 4.40–5.40)

## 2022-09-18 LAB — TACROLIMUS LEVEL, TROUGH: TACROLIMUS, TROUGH: 7.6 ng/mL (ref 5.0–15.0)

## 2022-09-18 LAB — VITAMIN D 25 HYDROXY: VITAMIN D, TOTAL (25OH): 31.8 ng/mL (ref 20.0–80.0)

## 2022-09-18 LAB — PHOSPHORUS: PHOSPHORUS: 2.3 mg/dL — ABNORMAL LOW (ref 2.4–5.1)

## 2022-09-18 LAB — MAGNESIUM: MAGNESIUM: 1.8 mg/dL (ref 1.6–2.6)

## 2022-09-18 MED ADMIN — mycophenolate (CELLCEPT) tablet 1,000 mg: 1000 mg | ORAL | @ 10:00:00 | Stop: 2022-09-18 | NDC 53191040901

## 2022-09-18 MED ADMIN — diphenhydrAMINE (BENADRYL) injection: 25 mg | INTRAVENOUS | @ 17:00:00 | Stop: 2022-09-18 | NDC 57866728801

## 2022-09-18 MED ADMIN — valGANciclovir (VALCYTE) tablet 450 mg: 450 mg | ORAL | @ 12:00:00 | NDC 69097027703

## 2022-09-18 MED ADMIN — pantoprazole (Protonix) injection 40 mg: 40 mg | INTRAVENOUS | @ 12:00:00 | NDC 82009001190

## 2022-09-18 MED ADMIN — pantoprazole (Protonix) injection 40 mg: 40 mg | INTRAVENOUS | @ 01:00:00 | NDC 82009001190

## 2022-09-18 MED ADMIN — hydrALAZINE (APRESOLINE) tablet 25 mg: 25 mg | ORAL | @ 17:00:00 | NDC 77771018310

## 2022-09-18 MED ADMIN — acetaminophen (TYLENOL) tablet 1,000 mg: 1000 mg | ORAL | @ 01:00:00 | Stop: 2022-09-19 | NDC 53191040901

## 2022-09-18 MED ADMIN — predniSONE (DELTASONE) tablet 5 mg: 5 mg | ORAL | @ 12:00:00 | NDC 55289085902

## 2022-09-18 MED ADMIN — oxyCODONE (ROXICODONE) immediate release tablet 10 mg: 10 mg | ORAL | @ 22:00:00 | Stop: 2022-09-20 | NDC 71335142204

## 2022-09-18 MED ADMIN — heparin (porcine) 5,000 unit/mL injection 5,000 Units: 5000 [IU] | SUBCUTANEOUS | @ 01:00:00 | NDC 00009029101

## 2022-09-18 MED ADMIN — lidocaine 4 % patch 1 patch: 1 | TRANSDERMAL | @ 12:00:00 | NDC 54868389400

## 2022-09-18 MED ADMIN — mucositis mixture (with lidocaine): 10 mL | OROMUCOSAL | @ 17:00:00 | NDC 72934540402

## 2022-09-18 MED ADMIN — bictegrav-emtricit-tenofov ala (BIKTARVY) 50-200-25 mg tablet 1 tablet: 1 | ORAL | @ 12:00:00 | NDC 61958250103

## 2022-09-18 MED ADMIN — acetaminophen (TYLENOL) tablet 1,000 mg: 1000 mg | ORAL | @ 17:00:00 | NDC 53191040901

## 2022-09-18 MED ADMIN — hydrOXYzine (ATARAX) tablet 25 mg: 25 mg | ORAL | @ 05:00:00 | NDC 00641637515

## 2022-09-18 MED ADMIN — diphenhydrAMINE (BENADRYL) injection: 25 mg | INTRAVENOUS | @ 10:00:00 | Stop: 2022-09-18 | NDC 57866728801

## 2022-09-18 MED ADMIN — hydrALAZINE (APRESOLINE) tablet 25 mg: 25 mg | ORAL | @ 10:00:00 | NDC 77771018310

## 2022-09-18 MED ADMIN — oxyCODONE (ROXICODONE) immediate release tablet 10 mg: 10 mg | ORAL | @ 05:00:00 | Stop: 2022-09-20 | NDC 71335142204

## 2022-09-18 MED ADMIN — hydrOXYzine (ATARAX) tablet 25 mg: 25 mg | ORAL | @ 21:00:00 | NDC 00641637515

## 2022-09-18 MED ADMIN — azathioprine (IMURAN) tablet 150 mg: 150 mg | ORAL | @ 17:00:00 | NDC 72789012930

## 2022-09-18 MED ADMIN — oxyCODONE (ROXICODONE) immediate release tablet 10 mg: 10 mg | ORAL | @ 10:00:00 | Stop: 2022-09-20 | NDC 71335142204

## 2022-09-18 MED ADMIN — potassium phosphate (monobasic) (K-PHOS) tablet 500 mg: 500 mg | ORAL | @ 17:00:00 | Stop: 2022-09-18 | NDC 79854050060

## 2022-09-18 MED ADMIN — hydrALAZINE (APRESOLINE) tablet 25 mg: 25 mg | ORAL | @ 01:00:00 | NDC 77771018310

## 2022-09-18 MED ADMIN — acetaminophen (TYLENOL) tablet 1,000 mg: 1000 mg | ORAL | @ 10:00:00 | Stop: 2022-09-18 | NDC 53191040901

## 2022-09-18 MED ADMIN — minoxidil (LONITEN) tablet 5 mg: 5 mg | ORAL | @ 12:00:00 | NDC 72789027901

## 2022-09-18 MED ADMIN — oxyCODONE (ROXICODONE) immediate release tablet 10 mg: 10 mg | ORAL | Stop: 2022-09-20 | NDC 71335142204

## 2022-09-18 MED ADMIN — carvedilol (COREG) tablet 50 mg: 50 mg | ORAL | @ 01:00:00 | NDC 82009012805

## 2022-09-18 MED ADMIN — potassium phosphate (monobasic) (K-PHOS) tablet 500 mg: 500 mg | ORAL | @ 22:00:00 | Stop: 2022-09-18 | NDC 79854050060

## 2022-09-18 MED ADMIN — calamine-zinc oxide 8-8 % lotion: TOPICAL | @ 21:00:00

## 2022-09-18 MED ADMIN — tacrolimus (PROGRAF) capsule 10 mg: 10 mg | ORAL | @ 22:00:00 | NDC 00469301601

## 2022-09-18 MED ADMIN — carvedilol (COREG) tablet 50 mg: 50 mg | ORAL | @ 12:00:00 | NDC 82009012805

## 2022-09-18 MED ADMIN — hydrOXYzine (ATARAX) tablet 25 mg: 25 mg | ORAL | @ 12:00:00 | NDC 00641637515

## 2022-09-18 MED ADMIN — diphenhydrAMINE (BENADRYL) capsule/tablet 25 mg: 25 mg | ORAL | @ 03:00:00 | NDC 97807007030

## 2022-09-18 MED ADMIN — oxyCODONE (ROXICODONE) immediate release tablet 10 mg: 10 mg | ORAL | @ 17:00:00 | Stop: 2022-09-20 | NDC 71335142204

## 2022-09-18 MED ADMIN — tacrolimus (PROGRAF) capsule 10 mg: 10 mg | ORAL | @ 10:00:00 | NDC 00469301601

## 2022-09-18 NOTE — Unmapped (Signed)
Transplant Surgery Progress Note    Assessment:     Christian Brown is a 42 y.o. 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 Induction with Solumedrol, Thymoglobulin    Interval Events:   Patients pain was controlled last night without IV pain medications. Today complaining of itching which he states is unbearable. Continues to state his esophagitis is so severe he is unable to swallow anything, including water. Over the last 48 hours has refused to adjust his medications to assist with the esophagitis and possible contributors. More willing to make changes today.     Plan:     Neuro:   - Sch Tylenol, STOP Robaxin, Neurontin   - PRN Oxy   - PRN PO Benadryl    *Non-compliance:    - consult transplant psychologists today     CV:   - HDS, Maintain SBP < 180   - Coreg to 50 mg, home midoxinil 5 mg , hydralazine 25 mg TID     Pulm:   - Stable on room air  - Continue incentive spirometry, pulmonary toilet, out of bed as tolerated    GI:   - F: ML   - E: replace as needed    - N: regular diet   - Protonix 40 BID , zofran, miralax, senna   - GI consult  7/14: No EGD at this point Protonix BID, GI recommendation on their follow up visit : 7/19 was to have a repeated EGD in two months in an outpatient settings   -MMF was substituted by Myfortic for possible pill induced esophagitis 7/19   -GI pathology  09/14/2022 was followed up today and mentioned:     - Negative for CMV and candida esophagitis    - Thought to be pill esophagitis per GI   - KUB 7/14: moderate colonic stool burden, no obstruction   - EGD ON 7/16:  was performed for him and the results revealed severe esophagitis and gastritis with no active bleeding lesion. So Protonix IV was started for him in addition to Carafate. A 10mm non-bleeding linear gastric ulcer.    - Reconsult GI     GU:   - Passed TOV, voiding spontaneously.     Endo:   - no acute issues    - has primary  parathyroidism   - Considered Sensipar but patient reports allergy   - Considered NS bolus and IV lasix but patient refused   - iCal 6.38 today     Heme/ID:   - Afebrile, WBC and Hgb stable   - ppx with nystatin, valcyte    Immuno:   - tac, cellcept, prednisone    - patient states myfortic caused severe itching     - refused sublingual tac    -plan to transition to azothioprine, TPMT ordered   - pharmacy recommendations for dosing   - HIV+ on BYCTARVY regiment     Dispo   - Floor status        Objective:        Vital Signs:  BP 135/76  - Pulse 82  - Temp 36.9 ??C (98.4 ??F) (Oral)  - Resp 18  - Ht 170.2 cm (5' 7.01)  - Wt 74 kg (163 lb 1.6 oz)  - SpO2 100%  - BMI 25.54 kg/m??     Input/Output:  I/O last 3 completed shifts:  In: 710 [P.O.:700; I.V.:10]  Out: 0  Intake/Output Summary (Last 24 hours) at 09/18/2022 1034  Last data filed at 09/17/2022 2131  Gross per 24 hour   Intake 410 ml   Output 0 ml   Net 410 ml        BM: 1X   URINE :  non documented ( 7 x urination)      Physical Exam:     General: Intermittently cooperative, no distress, well appearing male  Pulmonary: Normal work of breathing  Cardiovascular: Regular rate, NSR  Abdomen: Non-tender, not distended, incision dry and clean, well healing. Some swelling around incision but no signs of fluid collection or infection.  Musculoskeletal: Moves extremities spontaneously   Neurologic: Alert, grossly intact    Labs:  Lab Results   Component Value Date    WBC 6.1 09/18/2022    HGB 10.1 (L) 09/18/2022    HCT 31.2 (L) 09/18/2022    PLT 194 09/18/2022       Lab Results   Component Value Date    NA 139 09/18/2022    K 5.2 (H) 09/18/2022    CL 113 (H) 09/18/2022    CO2 23.0 09/18/2022    BUN 14 09/18/2022    CREATININE 2.11 (H) 09/18/2022    GLU 98 09/18/2022    CALCIUM 11.3 (H) 09/18/2022    MG 1.8 09/18/2022    PHOS 2.3 (L) 09/18/2022       Lab Results   Component Value Date    BILITOT 0.8 09/05/2022    BILIDIR 0.20 01/17/2022    PROT 8.0 09/05/2022    ALBUMIN 3.6 09/05/2022 ALT <7 (L) 09/05/2022    AST 14 09/05/2022    ALKPHOS 249 (H) 09/05/2022    GGT 15 09/05/2022       Lab Results   Component Value Date    PT 10.9 09/05/2022    INR 0.97 09/05/2022    APTT 39.9 (H) 09/05/2022      Tac level: 4.6-- 4.6 today  Ionized ca: 5.8-- 6.15 today    Total calcium: 7.4      Microbiology Results (last day)       Procedure Component Value Date/Time Date/Time    COVID-19 PCR [[id number]]  (Normal) Collected: 09/05/22 1145    Lab Status: Final result Specimen: Nasopharyngeal Swab Updated: 09/05/22 1258     SARS-CoV-2 PCR Negative    Narrative:      This test was performed using the Cepheid Xpert Xpress SARS-CoV-2 assay which has been validated by the CLIA-certified, CAP-inspected [facility]. FDA has granted Emergency Use Authorization for this test. This real-time RT-PCR test detects SARS-CoV-2 by targeting the N2 and E genes. Negative results do not preclude SARS-CoV-2 infection and should not be used as the sole basis for patient management decisions. Negative results must be combined with clinical observations, patient history, and epidemiological information. Information for providers and patients can be found here: [email or web address]      Urine Culture [[id number]]     Lab Status: No result Specimen: Urine from Clean Catch             Imaging:  All pertinent imaging personally reviewed.

## 2022-09-18 NOTE — Unmapped (Addendum)
Alert & oriented x4, vital signs stable on room air with the exception of one run of v tach (per telemetry, 7 beats)  Pt without s/sx    Pain managed with scheduled pain medications and use of PRN oxycodone   Patient up ad lib, ambulating independently, voiding spontaneously  Unable to measure urine, pt declining use of urinal   LBM 7/21, pt tolerating regular diet without nausea/vomiting   Abdominal staples clean;dry;intact   Pt received multiple prn IV medications for itching   No questions or concerns at this time    Problem: Wound  Goal: Optimal Coping  Outcome: Progressing  Goal: Optimal Functional Ability  Outcome: Progressing  Intervention: Optimize Functional Ability  Recent Flowsheet Documentation  Taken 09/17/2022 2000 by Cynda Acres, RN  Activity Management: up ad lib  Goal: Absence of Infection Signs and Symptoms  Outcome: Progressing  Intervention: Prevent or Manage Infection  Recent Flowsheet Documentation  Taken 09/17/2022 2000 by Cynda Acres, RN  Infection Management: aseptic technique maintained  Isolation Precautions: protective precautions maintained  Goal: Improved Oral Intake  Outcome: Progressing  Goal: Optimal Pain Control and Function  Outcome: Progressing  Goal: Skin Health and Integrity  Outcome: Progressing  Intervention: Optimize Skin Protection  Recent Flowsheet Documentation  Taken 09/17/2022 2000 by Cynda Acres, RN  Activity Management: up ad lib  Pressure Reduction Techniques:   frequent weight shift encouraged   heels elevated off bed  Head of Bed (HOB) Positioning: HOB at 20-30 degrees  Pressure Reduction Devices: pressure-redistributing mattress utilized  Skin Protection: adhesive use limited  Goal: Optimal Wound Healing  Outcome: Progressing     Problem: Adult Inpatient Plan of Care  Goal: Plan of Care Review  Outcome: Progressing  Goal: Patient-Specific Goal (Individualized)  Outcome: Progressing  Goal: Absence of Hospital-Acquired Illness or Injury  Outcome: Progressing  Intervention: Identify and Manage Fall Risk  Recent Flowsheet Documentation  Taken 09/17/2022 2000 by Cynda Acres, RN  Safety Interventions:   fall reduction program maintained   lighting adjusted for tasks/safety   low bed   nonskid shoes/slippers when out of bed  Intervention: Prevent Skin Injury  Recent Flowsheet Documentation  Taken 09/17/2022 2000 by Cynda Acres, RN  Positioning for Skin: Supine/Back  Device Skin Pressure Protection:   absorbent pad utilized/changed   adhesive use limited  Skin Protection: adhesive use limited  Intervention: Prevent and Manage VTE (Venous Thromboembolism) Risk  Recent Flowsheet Documentation  Taken 09/18/2022 0600 by Cynda Acres, RN  Anti-Embolism Device Type: SCD, Knee  Anti-Embolism Intervention: Refused  Anti-Embolism Device Location: BLE  Taken 09/18/2022 0400 by Cynda Acres, RN  Anti-Embolism Device Type: SCD, Knee  Anti-Embolism Intervention: Refused  Anti-Embolism Device Location: BLE  Taken 09/18/2022 0200 by Cynda Acres, RN  Anti-Embolism Device Type: SCD, Knee  Anti-Embolism Intervention: Refused  Anti-Embolism Device Location: BLE  Taken 09/18/2022 0000 by Cynda Acres, RN  Anti-Embolism Device Type: SCD, Knee  Anti-Embolism Intervention: Refused  Anti-Embolism Device Location: BLE  Taken 09/17/2022 2200 by Cynda Acres, RN  Anti-Embolism Device Type: SCD, Knee  Anti-Embolism Intervention: Refused  Anti-Embolism Device Location: BLE  Taken 09/17/2022 2131 by Cynda Acres, RN  VTE Prevention/Management: anticoagulant therapy  Anti-Embolism Device Type: SCD, Knee  Anti-Embolism Intervention: Refused  Anti-Embolism Device Location: BLE  Taken 09/17/2022 2000 by Cynda Acres, RN  Anti-Embolism Device Type: SCD, Knee  Anti-Embolism Intervention: Refused  Anti-Embolism Device Location: BLE  Intervention: Prevent Infection  Recent  Flowsheet Documentation  Taken 09/17/2022 2000 by Cynda Acres, RN  Infection Prevention:   cohorting utilized   environmental surveillance performed   equipment surfaces disinfected   hand hygiene promoted   personal protective equipment utilized   rest/sleep promoted   single patient room provided  Goal: Optimal Comfort and Wellbeing  Outcome: Progressing  Goal: Readiness for Transition of Care  Outcome: Progressing  Goal: Rounds/Family Conference  Outcome: Progressing     Problem: Fall Injury Risk  Goal: Absence of Fall and Fall-Related Injury  Outcome: Progressing  Intervention: Promote Scientist, clinical (histocompatibility and immunogenetics) Documentation  Taken 09/17/2022 2000 by Cynda Acres, RN  Safety Interventions:   fall reduction program maintained   lighting adjusted for tasks/safety   low bed   nonskid shoes/slippers when out of bed     Problem: Infection  Goal: Absence of Infection Signs and Symptoms  Outcome: Progressing  Intervention: Prevent or Manage Infection  Recent Flowsheet Documentation  Taken 09/17/2022 2000 by Cynda Acres, RN  Infection Management: aseptic technique maintained  Isolation Precautions: protective precautions maintained     Problem: Self-Care Deficit  Goal: Improved Ability to Complete Activities of Daily Living  Outcome: Progressing

## 2022-09-18 NOTE — Unmapped (Signed)
Confidential Psychological Therapy Session  [facility]    Patient Name: Christian Brown  Medical Record Number: [id number]  Date of Service: September 18, 2022  Clinical Psychologist: Artemio Aly, PhD  Intern: Barbette Or, MA  Time Spent:  20 minutes  of face-to-face counseling  CPT Procedure Code: 60630 (30 min psychotherapy with patient and/or family)  Therapy Type: Behavior Modifying/Cognitive Behavioral Therapy (CBT)  Purpose of Treatment: assess current MH needs, reduce depression symptoms, reduce anxiety symptoms, improve quality of life, improve coping, adjustment to chronic medical illness    The limits of confidentiality and the purpose of the evaluation were reviewed. The patient was provided with a verbal description of the nature and purpose of the psychological evaluation. I also reviewed the referral source, specific referral question for this evaluation, foreseeable risks/discomforts, benefits, limits of confidentiality, and mandatory reporting requirements of this provider. The patient was given the opportunity to ask questions and receive answers about the present evaluation. Oral consent was provided by the patient.     This evaluation note may contain sensitive and confidential information regarding the patient???s psychosocial adjustment to living with a chronic medical condition. DO NOT share this information outside [facility] without written consent from the patient explicitly stating that mental health records may be released.     Referral/Relevant History:  Mr.  Christian Brown is a very pleasant 42 y.o.  male who presents for cognitive behavioral therapy to address several concerns while inpatient, including poor sleep, difficulties with eating, and potential compliance concerns about taking medications. For instance, pt was referred to transplant psychology earlier today by transplant surgery, who notes: Patients pain was controlled last night without IV pain medications. Today complaining of itching which he states is unbearable. Continues to state his esophagitis is so severe he is unable to swallow anything, including water. Over the last 48 hours has refused to adjust his medications to assist with the esophagitis and possible contributors. More willing to make changes today. Previous notes also suggest pt is refusing procedures like PIV placement, recording urine output, refusing exams despite reporting severe pain, and refusing treatments for hypercalcemia due to a history of allergic reaction to Cinacalcet.     Christian Brown was previously evaluated by TSW Evelena Asa, LCSW, CCTSW on 03/25/20, with no significant MH or adherence concerns noted at that time.     He was seen today at bedside with his mother Cala Bradford present per his consent.     Review of Symptoms/ROS: Deferred    Subjective:   Christian Brown reported that he is ready to go home after he received his kidney transplant about two weeks ago. He described his main barrier prior to discharge as not being able to eat enough food, which he attributes to severe gastritis which began a day or two after surgery. He described this as feeling like his throat gets blocked when he tries to eat or drink, and he has tried many different foods and liquids without improvement (though noted that oatmeal was going down okay today). He stated that his medical team has tried several different treatments without success, and added that he is allergic to everything which makes finding treatments he can tolerate difficult. Pt reported that his upper body has been extremely itchy and was seen itching his arms at the beginning of today's visit, though this decreased by the end (reported taking Benadryl just prior to writer arriving).     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 He stated that he primarily sleeps during the day, but is unable to sleep during the day due to providers needing to see him. We briefly discussed some sleep hygiene skills, though his history of shift work and flipped circadian rhythm will likely take some time to reverse.     With regards to mood, Christian Brown denied any worries about his health or current situation, or symptoms of depression. Pt's mother asked him if he was worried about the gastritis impacting discharge or if he felt odd about having someone else's kidney in his body, and he denied worries about either of these. He is looking forward to not needing to do dialysis, but was pessimistic about how he will use his free time (e.g. 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     Christian Brown mother also reported that this process has been stressful for her as she is currently working in [place] and has had to communicate to [facility] several times from there. Writer inquired if she has been offered the [employer] and she was unaware of this, so Clinical research associate reached out to pt's team about potentially putting in this referral. She was appreciative of this.       Objective:   Mental Status Exam:  Appearance: Appears stated age, Clean/Neat, and was scratching arm initially during visit, but this stopped at the end of the visit  Motor: No abnormal movements  Speech/Language:  Somewhat slow, monotone  Mood:  Flat  Affect: Flat  Thought Process: Logical, linear, clear, coherent, goal directed  Thought Content: Denies SI, HI, self harm, delusions, obsessions, paranoid ideation, or ideas of reference  Perceptual Disturbances: Denies auditory and visual hallucinations, behavior not concerning for response to internal stimuli  Orientation: Oriented to person, place, time, and general circumstances  Attention: Able to fully attend without fluctuations in consciousness  Concentration: Able to fully concentrate and attend  Memory: Immediate, short-term, long-term, and recall grossly intact  Fund of Knowledge: Consistent with level of education and development  Insight: Fair  Judgment: Fair  Impulse Control: Fair      Assessment:  Mr.  Lindskog participated marginally in this CBT session, but it appears that rapport was slowly built after initially being somewhat slow to warm. Mr. Schleeter denied any current MH concerns today, though his flat affect and pessimistic view of how he will spend his free time off of dialysis may suggest symptoms that he is unwilling to identify with. He primarily complained about somatic symptoms, including insomnia (most likely attributed to work night shifts and typically sleeping during the day) and inability to eat, which he related to gastritis. It is possible that a degree of somatization is contributing to these symptoms, though Mr. Mccarville denied that this was a cause of his symptoms; psychoeducation was provided today about the mind-body connection and how anticipating challenges (e.g. I know I won't sleep tonight) can be a contributing factor for them being maintained. Mr. Rhein did not specifically address why he was refusing certain aspects of his care, but did state that he feels like he knows his body well and that he is allergic to most things, which makes treating him challenging. Pt's mother stated that she always taught him that he is valuable and that just because someone has a higher education, that does not make them better; Clinical research associate agreed  with them, but encouraged them to continue trying to work collaboratively with the team as we each have our own areas of expertise and are working towards the same goal of helping Mr. Mccastle feel well and succeed post-transplant. It does not appear that Mr. Yokley would be open to engagement in [facility] care at this time, but was agreeable to writer checking in with him every couple of days, if he is not discharged prior to this.    Focus on current treatment is assessing current MH needs, building rapport.     Focus on future sessions will include Motivational Interviewing for engaging with treatment team, providing psychoeducation and CBT-based coping strategies.    Adherence concerns: Yes, as noted above, pt has been refusing certain aspects of his care while inpatient.    Diagnostic Impression: R/O Psychological Factors Affecting Other Medical Conditions      Risk Assessment:  A suicide and violence risk assessment was performed as part of this evaluation. The patient is deemed to be at chronic elevated risk for self-harm/suicide given the following factors: recent onset of serious medical condition, current treatment non-compliance, and chronic severe medical condition. The patient is deemed to be at chronic elevated risk for violence given the following factors: male gender. These risk factors are mitigated by the following factors:lack of active SI/HI, no history of previous suicide attempts , supportive family, presence of an available support system, employment or functioning in a structured work/academic setting, and safe housing. There is no acute risk for suicide or violence at this time. The patient was educated about relevant modifiable risk factors including following recommendations for treatment of psychiatric illness and abstaining from substance abuse.    While future psychiatric events cannot be accurately predicted, the patient does not currently require  acute inpatient psychiatric care and does not currently meet [facility] involuntary commitment criteria.        Psychometric Testing: None administered today.        Plan:  Mr.  Twiddy will continue meeting with me for CBT while inpatient approximately once/week or as needed. Given denial of current symptoms, additional MH care may not be warranted at this time, but his mood and adherence should be closely monitored.    Mr.  Bergara was given this writer's business card with confidential voice mail number and instructed to call 911 for emergencies.

## 2022-09-18 NOTE — Unmapped (Signed)
Pt stable at present.  Up ad lib.  C/o left incisional pain to groin area.  Oxy given q4hr as pt request;  no c/o itchiness, incision intact - OTA.  Fistula intact.  Remote tele - NSR.  Pt tolerating po intake well.  Pt refused to record urine output.  Pt states peeing a lot.  Vss, afebrile.  Will continue to monitor.    Problem: Wound  Goal: Optimal Coping  Outcome: Progressing  Goal: Optimal Functional Ability  Outcome: Progressing  Intervention: Optimize Functional Ability  Recent Flowsheet Documentation  Taken 09/17/2022 0800 by Meriel Flavors D, RN  Activity Management: up ad lib  Goal: Absence of Infection Signs and Symptoms  Outcome: Progressing  Intervention: Prevent or Manage Infection  Recent Flowsheet Documentation  Taken 09/17/2022 0800 by Meriel Flavors D, RN  Infection Management: aseptic technique maintained  Isolation Precautions: protective precautions maintained  Goal: Improved Oral Intake  Outcome: Progressing  Goal: Optimal Pain Control and Function  Outcome: Progressing  Goal: Skin Health and Integrity  Outcome: Progressing  Intervention: Optimize Skin Protection  Recent Flowsheet Documentation  Taken 09/17/2022 0945 by Brooke Dare, RN  Pressure Reduction Techniques: frequent weight shift encouraged  Pressure Reduction Devices: pressure-redistributing mattress utilized  Skin Protection: tubing/devices free from skin contact  Taken 09/17/2022 0800 by Meriel Flavors D, RN  Activity Management: up ad lib  Pressure Reduction Techniques: frequent weight shift encouraged  Pressure Reduction Devices: pressure-redistributing mattress utilized  Skin Protection: tubing/devices free from skin contact  Goal: Optimal Wound Healing  Outcome: Progressing     Problem: Adult Inpatient Plan of Care  Goal: Plan of Care Review  Outcome: Progressing  Flowsheets (Taken 09/17/2022 1746)  Progress: improving  Plan of Care Reviewed With: patient  Goal: Patient-Specific Goal (Individualized)  Outcome: Progressing  Goal: Absence of Hospital-Acquired Illness or Injury  Outcome: Progressing  Intervention: Identify and Manage Fall Risk  Recent Flowsheet Documentation  Taken 09/17/2022 1230 by Meriel Flavors D, RN  Safety Interventions:   low bed   lighting adjusted for tasks/safety  Taken 09/17/2022 0800 by Meriel Flavors D, RN  Safety Interventions:   low bed   lighting adjusted for tasks/safety  Intervention: Prevent Skin Injury  Recent Flowsheet Documentation  Taken 09/17/2022 0945 by Meriel Flavors D, RN  Positioning for Skin: Supine/Back  Device Skin Pressure Protection: absorbent pad utilized/changed  Skin Protection: tubing/devices free from skin contact  Taken 09/17/2022 0800 by Meriel Flavors D, RN  Positioning for Skin: Supine/Back  Device Skin Pressure Protection: absorbent pad utilized/changed  Skin Protection: tubing/devices free from skin contact  Intervention: Prevent and Manage VTE (Venous Thromboembolism) Risk  Recent Flowsheet Documentation  Taken 09/17/2022 0945 by Meriel Flavors D, RN  Anti-Embolism Device Type: SCD, Knee  Anti-Embolism Intervention: Refused  Anti-Embolism Device Location: BLE  Goal: Optimal Comfort and Wellbeing  Outcome: Progressing  Goal: Readiness for Transition of Care  Outcome: Progressing  Goal: Rounds/Family Conference  Outcome: Progressing     Problem: Fall Injury Risk  Goal: Absence of Fall and Fall-Related Injury  Outcome: Progressing  Intervention: Promote Injury-Free Environment  Recent Flowsheet Documentation  Taken 09/17/2022 1230 by Meriel Flavors D, RN  Safety Interventions:   low bed   lighting adjusted for tasks/safety  Taken 09/17/2022 0800 by Meriel Flavors D, RN  Safety Interventions:   low bed   lighting adjusted for tasks/safety     Problem: Infection  Goal: Absence of Infection Signs and Symptoms  Outcome: Progressing  Intervention: Prevent or Manage  Infection  Recent Flowsheet Documentation  Taken 09/17/2022 0800 by Meriel Flavors D, RN  Infection Management: aseptic technique maintained  Isolation Precautions: protective precautions maintained     Problem: Self-Care Deficit  Goal: Improved Ability to Complete Activities of Daily Living  Outcome: Progressing

## 2022-09-18 NOTE — Unmapped (Signed)
Transplant Nephrology Follow-Up Consult note      Assessment/Recommendations: [name] is a 42 y.o. 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      # Status post kidney transplant, Allograft Function (stable):  KDPI 19, CPRA 7%, HLA 2/6, DCD  DDKT 7/9 in the setting of bx proven FSGS and HIV nephropathy  Induction thymoglobulin, steroids  Serum creatinine 2.11 mg/dL today    #Esophagitis   -EGD 09/12/2022: EGD showed severe esophagitis in the middle and distal third of the esophagus. Biopsies were taken for histology and viral PCR. Diffuse severe inflammation was also seen in the stomach which was biopsied. A 10mm non-bleeding linear gastric ulcer was also seen. Pathology with acute ulcerative esophagitis with foreign material and acute erosive gastropathy.    - BID PPI and liquid carafate  - Will stop mycophenolate today    #Normocytic anemia  Hgb stable at 10.1    # Immunosuppression [High risk medical decision making for drug therapy requiring intensive monitoring for toxicity]  Intolerant of MMF.   Will start azathioprine 150 mg daily.  Continue tacrolimus (trough target 8-10)    # Infectious Prophylaxis and Monitoring:   HIV - biktarvy  Nystatin (thrush ppx), valgangciclovir (CMV ppx), famotidine    # Blood Pressure / Volume:  Controlled on carvedilol 50 bid, hydarlazine 25 mg qid, minoxidil 5 mg daily     Transplant info  Recipient Labs: cPRA 7%, CMV positive, EBV positive, HCV Ab negative/ RNA negative, HBsAg negative, cAb negative/sAb positive, HIV positive, RPR negative, HSV-1 positive, HSV-2 positive, VZV IgG positive, Rubella IgG positive, Toxo IgG negative     DONOR INFORMATION:   ABO: O  HLA: A: 2, 26  B: 8, 62  DR: 17, 11  KDPI: 19  Cr: initial 0.95, peak: 1.92, terminal: 1.38  Status: DCD    **Transplant patients with an open wound require wound care with sterile water only. The patient should be counseled on this at the time of discharge if they have not already been doing this.**     Patient was seen and discussed with attending nephrologist Dr.Kleman. Recommendations were discussed with the primary team. Nephrology will continue to follow.    Thank you for this consult.   Please page the renal fellow on call with questions/concerns.    Kerrin Mo, MD  Division of Nephrology and Hypertension  [facility]  09/18/2022    ___________________________________________________________    Subjective/Interval History:   - continues to have burning and pain in the epigastric region     Medications:   Current Facility-Administered Medications   Medication Dose Route Frequency Provider Last Rate Last Admin    acetaminophen (TYLENOL) tablet 1,000 mg  1,000 mg Oral Q8H [facility] Kapoor, Kunal, MD   1,000 mg at 09/18/22 1328    albuterol 2.5 mg /3 mL (0.083 %) nebulizer solution 2.5 mg  2.5 mg Nebulization Q6H PRN Norlene Duel, MD   2.5 mg at 09/15/22 1240    azathioprine (IMURAN) tablet 150 mg  150 mg Oral Daily Mathis Dad, MD   150 mg at 09/18/22 1252    bictegrav-emtricit-tenofov ala (BIKTARVY) 50-200-25 mg tablet 1 tablet  1 tablet Oral Daily Norlene Duel, MD   1 tablet at 09/18/22 0827    bisacodyl (DULCOLAX) suppository 10 mg  10 mg Rectal Daily PRN Norlene Duel, MD   10 mg at 09/11/22 2103    bismuth subsalicylate (PEPTO-BISMOL) oral suspension  30 mL Oral Q6H PRN Norlene Duel, MD   30 mL at 09/09/22 2136    calamine-zinc oxide 8-8 % lotion   Topical QID PRN Zappas, Barbie Haggis, MD        carvedilol (COREG) tablet 50 mg  50 mg Oral BID Trevor Iha, DO   50 mg at 09/18/22 0827    diphenhydrAMINE (BENADRYL) capsule/tablet 25 mg  25 mg Oral Q6H PRN Ermelinda Das, Aidin, MD   25 mg at 09/17/22 2322    diphenhydrAMINE (BENADRYL) injection  12.5 mg Intravenous Q6H PRN Zappas, Barbie Haggis, MD        heparin (porcine) 5,000 unit/mL injection 5,000 Units  5,000 Units Subcutaneous Q8H [facility] Norlene Duel, MD   5,000 Units at 09/17/22 2122    hydrALAZINE (APRESOLINE) injection 10 mg  10 mg Intravenous Q6H PRN Norlene Duel, MD        hydrALAZINE (APRESOLINE) tablet 25 mg  25 mg Oral Q8H [facility] Trevor Iha, DO   25 mg at 09/18/22 1327    hydrOXYzine (ATARAX) tablet 25 mg  25 mg Oral Q8H Ermelinda Das, Aidin, MD   25 mg at 09/18/22 2025    lactated Ringers infusion  10 mL/hr Intravenous Continuous Norlene Duel, MD   Held at 09/08/22 0240    lidocaine 4 % patch 1 patch  1 patch Transdermal Daily Norlene Duel, MD   1 patch at 09/18/22 0827    minoxidil (LONITEN) tablet 5 mg  5 mg Oral Q AM Norlene Duel, MD   5 mg at 09/18/22 0826    mucositis mixture (with lidocaine)  10 mL Mouth QID Mathis Dad, MD   10 mL at 09/18/22 1251    nystatin (MYCOSTATIN) oral suspension  10 mL Oral [facility] Norlene Duel, MD   1,000,000 Units at 09/11/22 0504    ondansetron (ZOFRAN) injection 4 mg  4 mg Intravenous Q8H PRN Norlene Duel, MD   4 mg at 09/09/22 0250    oxyCODONE (ROXICODONE) immediate release tablet 5 mg  5 mg Oral Q4H PRN Norlene Duel, MD        Or    oxyCODONE (ROXICODONE) immediate release tablet 10 mg  10 mg Oral Q4H PRN Norlene Duel, MD   10 mg at 09/18/22 1255    pantoprazole (Protonix) injection 40 mg  40 mg Intravenous BID Norlene Duel, MD   40 mg at 09/18/22 0826    potassium phosphate (monobasic) (K-PHOS) tablet 500 mg  500 mg Oral 4xd [facility]als [facility] Mathis Dad, MD   500 mg at 09/18/22 1254    predniSONE (DELTASONE) tablet 5 mg  5 mg Oral Daily Norlene Duel, MD   5 mg at 09/18/22 4270    sucralfate (CARAFATE) oral suspension  1 g Oral BID Edrick Kins, MD   1 g at 09/16/22 2122    tacrolimus (PROGRAF) capsule 10 mg  10 mg Oral BID Ermelinda Das, Aidin, MD   10 mg at 09/18/22 0602    valGANciclovir (VALCYTE) tablet 450 mg  450 mg Oral Daily Billey Chang, MD   450 mg at 09/18/22 0827        Physical Exam:   Vitals:    09/18/22 1327  BP: 128/69   Pulse:    Resp:    Temp:    SpO2:      I/O this shift:  In: 240 [P.O.:240]  Out: -     Intake/Output Summary (Last 24 hours) at 09/18/2022 1512  Last data filed at 09/18/2022 1215  Gross per 24 hour   Intake 650 ml   Output 0 ml   Net 650 ml     General: well-appearing, NAD  HEENT: conjunctival anicteric  CV: RRR nl s1/s2 no s3/s4 no m/r, no JVD, no LE edema  Lungs: nl WOB, CTAB w/o adventitious sounds  Abd: incision with bandage on right lower quadrant  Skin: Left upper extremity with fistula  Psych: alert, engaged, appropriate mood and affect  Neuro: normal speech, no gross focal deficits     LABS/IMAGING: Lab and imaging results from last 24 hrs noted and reviewed in EMR.

## 2022-09-18 NOTE — Unmapped (Signed)
Pt stable at present.  However, c/o itchiness and rash to back and arms.  Bendryl and atarax given for some relief.  MD ordered some calamine cream.  Will give to pt .  Oxy given x 1, vss, afebrile.  Remote telemetry - NSR.  Staples to abdomen dry and intact.  OTA.  Mother at bedside.  Tolerating po intake.  Will continue to monitor.    Problem: Wound  Goal: Optimal Coping  Outcome: Progressing  Goal: Optimal Functional Ability  Outcome: Progressing  Goal: Absence of Infection Signs and Symptoms  Outcome: Progressing  Intervention: Prevent or Manage Infection  Recent Flowsheet Documentation  Taken 09/18/2022 1020 by Meriel Flavors D, RN  Isolation Precautions: protective precautions maintained  Goal: Improved Oral Intake  Outcome: Progressing  Goal: Optimal Pain Control and Function  Outcome: Progressing  Goal: Skin Health and Integrity  Outcome: Progressing  Goal: Optimal Wound Healing  Outcome: Progressing     Problem: Adult Inpatient Plan of Care  Goal: Plan of Care Review  Outcome: Progressing  Flowsheets (Taken 09/18/2022 1631)  Progress: improving  Plan of Care Reviewed With: patient  Goal: Patient-Specific Goal (Individualized)  Outcome: Progressing  Goal: Absence of Hospital-Acquired Illness or Injury  Outcome: Progressing  Intervention: Identify and Manage Fall Risk  Recent Flowsheet Documentation  Taken 09/18/2022 1616 by Meriel Flavors D, RN  Safety Interventions:   low bed   lighting adjusted for tasks/safety   family at bedside  Taken 09/18/2022 1020 by Meriel Flavors D, RN  Safety Interventions:   low bed   lighting adjusted for tasks/safety   fall reduction program maintained  Taken 09/18/2022 0815 by Meriel Flavors D, RN  Safety Interventions:   low bed   lighting adjusted for tasks/safety  Intervention: Prevent Skin Injury  Recent Flowsheet Documentation  Taken 09/18/2022 0845 by Meriel Flavors D, RN  Positioning for Skin: (ad lib) Other (Comment)  Intervention: Prevent and Manage VTE (Venous Thromboembolism) Risk  Recent Flowsheet Documentation  Taken 09/18/2022 0845 by Meriel Flavors D, RN  Anti-Embolism Device Type: SCD, Knee  Anti-Embolism Intervention: Refused  Anti-Embolism Device Location: BLE  Goal: Optimal Comfort and Wellbeing  Outcome: Progressing  Goal: Readiness for Transition of Care  Outcome: Progressing  Goal: Rounds/Family Conference  Outcome: Progressing     Problem: Fall Injury Risk  Goal: Absence of Fall and Fall-Related Injury  Outcome: Progressing  Intervention: Promote Injury-Free Environment  Recent Flowsheet Documentation  Taken 09/18/2022 1616 by Meriel Flavors D, RN  Safety Interventions:   low bed   lighting adjusted for tasks/safety   family at bedside  Taken 09/18/2022 1020 by Meriel Flavors D, RN  Safety Interventions:   low bed   lighting adjusted for tasks/safety   fall reduction program maintained  Taken 09/18/2022 0815 by Meriel Flavors D, RN  Safety Interventions:   low bed   lighting adjusted for tasks/safety     Problem: Infection  Goal: Absence of Infection Signs and Symptoms  Outcome: Progressing  Intervention: Prevent or Manage Infection  Recent Flowsheet Documentation  Taken 09/18/2022 1020 by Meriel Flavors D, RN  Isolation Precautions: protective precautions maintained     Problem: Self-Care Deficit  Goal: Improved Ability to Complete Activities of Daily Living  Outcome: Progressing

## 2022-09-19 DIAGNOSIS — Z940 Kidney transplant status: Principal | ICD-10-CM

## 2022-09-19 LAB — PHOSPHORUS: PHOSPHORUS: 2.4 mg/dL (ref 2.4–5.1)

## 2022-09-19 LAB — BASIC METABOLIC PANEL
ANION GAP: 3 mmol/L — ABNORMAL LOW (ref 5–14)
BLOOD UREA NITROGEN: 15 mg/dL (ref 9–23)
BUN / CREAT RATIO: 7
CALCIUM: 11.3 mg/dL — ABNORMAL HIGH (ref 8.7–10.4)
CHLORIDE: 113 mmol/L — ABNORMAL HIGH (ref 98–107)
CO2: 22 mmol/L (ref 20.0–31.0)
CREATININE: 2.13 mg/dL — ABNORMAL HIGH
EGFR CKD-EPI (2021) MALE: 40 mL/min/{1.73_m2} — ABNORMAL LOW (ref >=60)
GLUCOSE RANDOM: 84 mg/dL (ref 70–179)
POTASSIUM: 5.4 mmol/L — ABNORMAL HIGH (ref 3.4–4.8)
SODIUM: 138 mmol/L (ref 135–145)

## 2022-09-19 LAB — CBC
HEMATOCRIT: 30.3 % — ABNORMAL LOW (ref 39.0–48.0)
HEMOGLOBIN: 9.8 g/dL — ABNORMAL LOW (ref 12.9–16.5)
MEAN CORPUSCULAR HEMOGLOBIN CONC: 32.5 g/dL (ref 32.0–36.0)
MEAN CORPUSCULAR HEMOGLOBIN: 30.3 pg (ref 25.9–32.4)
MEAN CORPUSCULAR VOLUME: 93.2 fL (ref 77.6–95.7)
MEAN PLATELET VOLUME: 7.9 fL (ref 6.8–10.7)
PLATELET COUNT: 197 10*9/L (ref 150–450)
RED BLOOD CELL COUNT: 3.25 10*12/L — ABNORMAL LOW (ref 4.26–5.60)
RED CELL DISTRIBUTION WIDTH: 19.1 % — ABNORMAL HIGH (ref 12.2–15.2)
WBC ADJUSTED: 5.9 10*9/L (ref 3.6–11.2)

## 2022-09-19 LAB — MAGNESIUM: MAGNESIUM: 1.7 mg/dL (ref 1.6–2.6)

## 2022-09-19 LAB — VITAMIN D 1,25 DIHYDROXY: VITAMIN D 1,25-DIHYDROXY: 25 pg/mL

## 2022-09-19 LAB — TACROLIMUS LEVEL, TROUGH: TACROLIMUS, TROUGH: 7.4 ng/mL (ref 5.0–15.0)

## 2022-09-19 MED ORDER — LETERMOVIR 480 MG TABLET
ORAL_TABLET | Freq: Every day | ORAL | 0 refills | 30 days
Start: 2022-09-19 — End: 2022-09-19

## 2022-09-19 MED ADMIN — sodium chloride 0.9% (NS) bolus 500 mL: 500 mL | INTRAVENOUS | @ 14:00:00 | Stop: 2022-09-19 | NDC 05928011001

## 2022-09-19 MED ADMIN — acetaminophen (TYLENOL) tablet 1,000 mg: 1000 mg | ORAL | @ 02:00:00 | NDC 53191040901

## 2022-09-19 MED ADMIN — hydrocortisone 1 % cream: TOPICAL | @ 13:00:00 | NDC 71863011050

## 2022-09-19 MED ADMIN — hydrALAZINE (APRESOLINE) tablet 25 mg: 25 mg | ORAL | @ 02:00:00 | NDC 77771018310

## 2022-09-19 MED ADMIN — hydrOXYzine (ATARAX) tablet 25 mg: 25 mg | ORAL | @ 04:00:00 | NDC 00641637515

## 2022-09-19 MED ADMIN — diphenhydrAMINE (BENADRYL) capsule/tablet 25 mg: 25 mg | ORAL | @ 21:00:00 | NDC 97807007030

## 2022-09-19 MED ADMIN — hydrOXYzine (ATARAX) tablet 25 mg: 25 mg | ORAL | @ 14:00:00 | NDC 00641637515

## 2022-09-19 MED ADMIN — minoxidil (LONITEN) tablet 5 mg: 5 mg | ORAL | @ 14:00:00 | NDC 72789027901

## 2022-09-19 MED ADMIN — diphenhydrAMINE (BENADRYL) injection: 12.5 mg | INTRAVENOUS | @ 23:00:00 | NDC 57866728801

## 2022-09-19 MED ADMIN — tacrolimus (PROGRAF) 11 mg combo product: 11 mg | ORAL | @ 21:00:00 | NDC 00469301601

## 2022-09-19 MED ADMIN — tacrolimus (PROGRAF) capsule 10 mg: 10 mg | ORAL | @ 10:00:00 | Stop: 2022-09-19 | NDC 00469301601

## 2022-09-19 MED ADMIN — hydrOXYzine (ATARAX) tablet 25 mg: 25 mg | ORAL | @ 21:00:00 | NDC 00641637515

## 2022-09-19 MED ADMIN — pantoprazole (Protonix) EC tablet 40 mg: 40 mg | ORAL | @ 14:00:00 | NDC 82009001190

## 2022-09-19 MED ADMIN — carvedilol (COREG) tablet 50 mg: 50 mg | ORAL | @ 02:00:00 | NDC 82009012805

## 2022-09-19 MED ADMIN — magnesium sulfate 2gm/50mL IVPB: 2 g | INTRAVENOUS | @ 18:00:00 | Stop: 2022-09-19

## 2022-09-19 MED ADMIN — acetaminophen (TYLENOL) tablet 1,000 mg: 1000 mg | ORAL | @ 10:00:00 | Stop: 2022-09-19 | NDC 53191040901

## 2022-09-19 MED ADMIN — oxyCODONE (ROXICODONE) immediate release tablet 10 mg: 10 mg | ORAL | @ 03:00:00 | Stop: 2022-09-20 | NDC 71335142204

## 2022-09-19 MED ADMIN — bictegrav-emtricit-tenofov ala (BIKTARVY) 50-200-25 mg tablet 1 tablet: 1 | ORAL | @ 15:00:00 | NDC 61958250103

## 2022-09-19 MED ADMIN — lidocaine 4 % patch 1 patch: 1 | TRANSDERMAL | @ 14:00:00 | NDC 54868389400

## 2022-09-19 MED ADMIN — predniSONE (DELTASONE) tablet 5 mg: 5 mg | ORAL | @ 14:00:00 | NDC 55289085902

## 2022-09-19 MED ADMIN — azathioprine (IMURAN) tablet 150 mg: 150 mg | ORAL | @ 15:00:00 | NDC 72789012930

## 2022-09-19 MED ADMIN — heparin (porcine) 5,000 unit/mL injection 5,000 Units: 5000 [IU] | SUBCUTANEOUS | @ 02:00:00 | NDC 00009029101

## 2022-09-19 MED ADMIN — carvedilol (COREG) tablet 50 mg: 50 mg | ORAL | @ 13:00:00 | NDC 82009012805

## 2022-09-19 MED ADMIN — calamine-zinc oxide 8-8 % lotion: TOPICAL | @ 10:00:00

## 2022-09-19 MED ADMIN — diphenhydrAMINE (BENADRYL) injection: 12.5 mg | INTRAVENOUS | @ 05:00:00 | NDC 57866728801

## 2022-09-19 MED ADMIN — pantoprazole (Protonix) injection 40 mg: 40 mg | INTRAVENOUS | @ 02:00:00 | NDC 82009001190

## 2022-09-19 MED ADMIN — hydrALAZINE (APRESOLINE) tablet 25 mg: 25 mg | ORAL | @ 10:00:00 | Stop: 2022-09-19 | NDC 77771018310

## 2022-09-19 MED ADMIN — potassium phosphate (monobasic) (K-PHOS) tablet 500 mg: 500 mg | ORAL | @ 02:00:00 | Stop: 2022-09-18 | NDC 79854050060

## 2022-09-19 MED ADMIN — valGANciclovir (VALCYTE) tablet 450 mg: 450 mg | ORAL | @ 14:00:00 | NDC 69097027703

## 2022-09-19 MED ADMIN — oxyCODONE (ROXICODONE) immediate release tablet 10 mg: 10 mg | ORAL | @ 10:00:00 | Stop: 2022-09-20 | NDC 71335142204

## 2022-09-19 MED ADMIN — diphenhydrAMINE (BENADRYL) capsule/tablet 25 mg: 25 mg | ORAL | @ 03:00:00 | NDC 97807007030

## 2022-09-19 MED ADMIN — diphenhydrAMINE (BENADRYL) capsule/tablet 25 mg: 25 mg | ORAL | @ 07:00:00 | Stop: 2022-09-19 | NDC 97807007030

## 2022-09-19 NOTE — Unmapped (Signed)
VENOUS ACCESS TEAM PROCEDURE    Nurse request was placed for a PIV by Venous Access Team (VAT).  Patient was assessed at bedside for placement of a PIV. PPE were donned per protocol.  Access was obtained. Blood return noted.  Dressing intact and device well secured.  Flushed with normal saline.  See LDA for details.  Pt advised to inform RN of any s/s of discomfort at the PIV site.    Workup / Procedure Time:  30 minutes        RN was notified.       Thank you,     Iantha Fallen, RN Venous Access Team

## 2022-09-19 NOTE — Unmapped (Signed)
VENOUS ACCESS TEAM PROCEDURE    Nurse request was placed for a PIV by Venous Access Team (VAT).  Patient was assessed at bedside for placement of a PIV. PPE were donned per protocol.  Access was obtained. Blood return noted.  Dressing intact and device well secured.  Flushed with normal saline.  See LDA for details.  Pt advised to inform RN of any s/s of discomfort at the PIV site.    Workup / Procedure Time:  15 minutes        RN was notified.       Thank you,     Jamella Grayer G Elle Vezina, RN Venous Access Team

## 2022-09-19 NOTE — Unmapped (Addendum)
Transplant Nephrology Follow-Up Consult note      Assessment/Recommendations: [name] is a 42 y.o. 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      # Status post kidney transplant, Allograft Function (stable):  KDPI 19, CPRA 7%, HLA 2/6, DCD  DDKT 7/9 in the setting of bx proven FSGS and HIV nephropathy  Induction thymoglobulin, steroids  Serum creatinine stable today at around 2.1    #Esophagitis   -EGD 09/12/2022: EGD showed severe esophagitis in the middle and distal third of the esophagus. Biopsies were taken for histology and viral PCR. Diffuse severe inflammation was also seen in the stomach which was biopsied. A 10mm non-bleeding linear gastric ulcer was also seen. Pathology with acute ulcerative esophagitis with foreign material and acute erosive gastropathy, concern for pill esophagitis    - BID PPI and liquid carafate  - mycophenolate stopped on 7/22, and transitioned to imuran  - still symptomatic, but improved from prior    #Normocytic anemia  Hgb stable at around 10    # Immunosuppression [High risk medical decision making for drug therapy requiring intensive monitoring for toxicity]  Intolerant of MMF.   Continue azathioprine 150 mg daily.  Continue tacrolimus (trough target 8-10)    # Infectious Prophylaxis and Monitoring:   HIV - biktarvy  Nystatin (thrush ppx), valgangciclovir (CMV ppx), famotidine    # Blood Pressure / Volume:  Controlled on carvedilol 50 bid, hydarlazine 25 mg qid, minoxidil 5 mg daily  Complaining of diffuse itching which may be due to hydralazine. Will discontinue hydralazine and increase minoxidil to 5 mg bid     Transplant info  Recipient Labs: cPRA 7%, CMV positive, EBV positive, HCV Ab negative/ RNA negative, HBsAg negative, cAb negative/sAb positive, HIV positive, RPR negative, HSV-1 positive, HSV-2 positive, VZV IgG positive, Rubella IgG positive, Toxo IgG negative     DONOR INFORMATION:   ABO: O  HLA: A: 2, 26  B: 8, 62  DR: 17, 11  KDPI: 19  Cr: initial 0.95, peak: 1.92, terminal: 1.38  Status: DCD    **Transplant patients with an open wound require wound care with sterile water only. The patient should be counseled on this at the time of discharge if they have not already been doing this.**     Patient was seen and discussed with attending nephrologist Dr. Margaretmary Bayley. Recommendations were discussed with the primary team. Nephrology will continue to follow.    Thank you for this consult.   Please page the renal fellow on call with questions/concerns.    Worthy Keeler, DO  Division of Nephrology and Hypertension  [facility]  09/19/2022    ___________________________________________________________    Subjective/Interval History:   - continues to have burning and pain in the epigastric region     Medications:   Current Facility-Administered Medications   Medication Dose Route Frequency Provider Last Rate Last Admin    acetaminophen (TYLENOL) oral liquid  1,000 mg Oral Q8H Kapoor, Kunal, MD        albuterol 2.5 mg /3 mL (0.083 %) nebulizer solution 2.5 mg  2.5 mg Nebulization Q6H PRN Norlene Duel, MD   2.5 mg at  09/15/22 1240    [START ON 10/05/2022] atovaquone (MEPRON) oral suspension  1,500 mg Oral Daily Kapoor, Pershing Proud, MD        azathioprine (IMURAN) tablet 150 mg  150 mg Oral Daily Mathis Dad, MD   150 mg at 09/19/22 1039    bictegrav-emtricit-tenofov ala (BIKTARVY) 50-200-25 mg tablet 1 tablet  1 tablet Oral Daily Norlene Duel, MD   1 tablet at 09/19/22 1039    bisacodyl (DULCOLAX) suppository 10 mg  10 mg Rectal Daily PRN Norlene Duel, MD   10 mg at 09/11/22 2103 bismuth subsalicylate (PEPTO-BISMOL) oral suspension  30 mL Oral Q6H PRN Norlene Duel, MD   30 mL at 09/09/22 2136    calamine-zinc oxide 8-8 % lotion   Topical QID Norlene Duel, MD   Given at 09/19/22 0626    carvedilol (COREG) tablet 50 mg  50 mg Oral BID Trevor Iha, DO   50 mg at 09/19/22 2956    diphenhydrAMINE (BENADRYL) capsule/tablet 25 mg  25 mg Oral Q6H PRN Ermelinda Das, Aidin, MD   25 mg at 09/18/22 2300    diphenhydrAMINE (BENADRYL) injection  12.5 mg Intravenous Q6H PRN Zappas, Barbie Haggis, MD   12.5 mg at 09/19/22 0054    heparin (porcine) 5,000 unit/mL injection 5,000 Units  5,000 Units Subcutaneous [facility] Norlene Duel, MD   5,000 Units at 09/18/22 2134    hydrALAZINE (APRESOLINE) injection 10 mg  10 mg Intravenous Q6H PRN Norlene Duel, MD        hydrocortisone 1 % cream   Topical BID Mathis Dad, MD   Given at 09/19/22 0929    hydrOXYzine (ATARAX) tablet 25 mg  25 mg Oral Q8H Ermelinda Das, Aidin, MD   25 mg at 09/19/22 0930    lactated Ringers infusion  10 mL/hr Intravenous Continuous Norlene Duel, MD   Held at 09/08/22 0240    lidocaine 4 % patch 1 patch  1 patch Transdermal Daily Norlene Duel, MD   1 patch at 09/19/22 0930    magnesium sulfate 2gm/66mL IVPB  2 g Intravenous Once Zappas, Barbie Haggis, MD        minoxidil (LONITEN) tablet 5 mg  5 mg Oral BID Mathis Dad, MD   5 mg at 09/19/22 0930    mucositis mixture (with lidocaine)  10 mL Mouth QID Mathis Dad, MD   10 mL at 09/18/22 1251    nystatin (MYCOSTATIN) oral suspension  10 mL Oral [facility] Norlene Duel, MD   1,000,000 Units at 09/11/22 0504    ondansetron (ZOFRAN) injection 4 mg  4 mg Intravenous Q8H PRN Norlene Duel, MD   4 mg at 09/09/22 0250    oxyCODONE (ROXICODONE) immediate release tablet 5 mg  5 mg Oral Q4H PRN Norlene Duel, MD        Or    oxyCODONE (ROXICODONE) immediate release tablet 10 mg  10 mg Oral Q4H PRN Norlene Duel, MD   10 mg at 09/19/22 2130 pantoprazole (Protonix) EC tablet 40 mg  40 mg Oral BID Mathis Dad, MD   40 mg at 09/19/22 0930    predniSONE (DELTASONE) tablet 5 mg  5 mg Oral Daily Norlene Duel, MD   5 mg at 09/19/22 0930    sucralfate (CARAFATE) oral suspension  1 g Oral BID Edrick Kins, MD   1 g at 09/16/22 2122    tacrolimus (PROGRAF) capsule 10 mg  10 mg Oral BID Ermelinda Das, Aidin, MD   10 mg at  09/19/22 1610    valGANciclovir (VALCYTE) tablet 450 mg  450 mg Oral Daily Zappas, Barbie Haggis, MD   450 mg at 09/19/22 0930        Physical Exam:   Vitals:    09/19/22 1115   BP: 103/51   Pulse: 74   Resp: 16   Temp: 36.8 ??C (98.2 ??F)   SpO2: 100%     I/O this shift:  In: 60 [P.O.:60]  Out: 0     Intake/Output Summary (Last 24 hours) at 09/19/2022 1352  Last data filed at 09/19/2022 1115  Gross per 24 hour   Intake 310 ml   Output 0 ml   Net 310 ml     General: well-appearing, NAD  HEENT: conjunctival anicteric  CV: RRR nl s1/s2 no s3/s4 no m/r, no JVD, no LE edema  Lungs: nl WOB, CTAB w/o adventitious sounds  Abd: incision with bandage on right lower quadrant  Skin: dry  Psych: alert, engaged, appropriate mood and affect  Neuro: normal speech, no gross focal deficits   HD access: Left upper extremity AVF    LABS/IMAGING: Lab and imaging results from last 24 hrs noted and reviewed in EMR.

## 2022-09-19 NOTE — Unmapped (Addendum)
Tacrolimus Therapeutic Monitoring Pharmacy Note    Christian Brown is a 42 y.o. male continuing tacrolimus.     Indication: Kidney transplant     Date of Transplant:  09/05/2022       Prior Dosing Information: Current regimen tacrolimus 10 mg PO BID.      Goals:  Therapeutic Drug Levels  Tacrolimus trough goal: 8-11 ng/mL    Additional Clinical Monitoring/Outcomes  Monitor renal function (SCr and urine output) and liver function (LFTs)  Monitor for signs/symptoms of adverse events (e.g., hyperglycemia, hyperkalemia, hypomagnesemia, hypertension, headache, tremor)    Results:   Tacrolimus level:  7.4 ng/mL, drawn appropriately    Pharmacokinetic Considerations and Significant Drug Interactions:  Concurrent hepatotoxic medications: None identified  Concurrent CYP3A4 substrates/inhibitors: None identified  Concurrent nephrotoxic medications: None identified    Assessment/Plan:  Recommendedation(s)  Increase to tacrolimus 11 mg PO BID    Follow-up  Daily levels currently ordered .   A pharmacist will continue to monitor and recommend levels as appropriate    Please page service pharmacist with questions/clarifications.    Vertis Kelch, PharmD

## 2022-09-19 NOTE — Unmapped (Signed)
Alert & oriented x4, vital signs stable on room air, without calls from tele   Pain managed with scheduled pain medications and use of PRN oxycodone   Patient up ad lib, ambulating independently, voiding spontaneously  Unable to measure urine, pt declining use of urinal   LBM 7/21, pt tolerating regular diet without nausea/vomiting   Abdominal staples clean;dry;intact   Pt received multiple prn medications for itching, MD notified due to use of all 3 PRN medications  One-time additional 25 mg of benadryl administered, pt endorsing some improvement this AM  Per conversation with pt, itching is worse in evening, exacerbated by shower, and this admission is their first time having this itch/rash  Pt independently showered, IV dressing changed   No questions or concerns at this time    Problem: Wound  Goal: Optimal Coping  Outcome: Progressing  Goal: Optimal Functional Ability  Outcome: Progressing  Intervention: Optimize Functional Ability  Recent Flowsheet Documentation  Taken 09/18/2022 2000 by Cynda Acres, RN  Activity Management: up ad lib  Goal: Absence of Infection Signs and Symptoms  Outcome: Progressing  Intervention: Prevent or Manage Infection  Recent Flowsheet Documentation  Taken 09/18/2022 2000 by Cynda Acres, RN  Infection Management: aseptic technique maintained  Isolation Precautions: protective precautions maintained  Goal: Improved Oral Intake  Outcome: Progressing  Goal: Optimal Pain Control and Function  Outcome: Progressing  Goal: Skin Health and Integrity  Outcome: Progressing  Intervention: Optimize Skin Protection  Recent Flowsheet Documentation  Taken 09/18/2022 2000 by Cynda Acres, RN  Activity Management: up ad lib  Pressure Reduction Techniques:   frequent weight shift encouraged   heels elevated off bed  Head of Bed (HOB) Positioning: HOB at 20-30 degrees  Pressure Reduction Devices: pressure-redistributing mattress utilized  Skin Protection: adhesive use limited  Goal: Optimal Wound Healing  Outcome: Progressing     Problem: Adult Inpatient Plan of Care  Goal: Plan of Care Review  Outcome: Progressing  Goal: Patient-Specific Goal (Individualized)  Outcome: Progressing  Goal: Absence of Hospital-Acquired Illness or Injury  Outcome: Progressing  Intervention: Identify and Manage Fall Risk  Recent Flowsheet Documentation  Taken 09/18/2022 2000 by Cynda Acres, RN  Safety Interventions:   fall reduction program maintained   lighting adjusted for tasks/safety   low bed   nonskid shoes/slippers when out of bed  Intervention: Prevent Skin Injury  Recent Flowsheet Documentation  Taken 09/18/2022 2000 by Cynda Acres, RN  Positioning for Skin: Supine/Back  Device Skin Pressure Protection:   absorbent pad utilized/changed   adhesive use limited  Skin Protection: adhesive use limited  Intervention: Prevent and Manage VTE (Venous Thromboembolism) Risk  Recent Flowsheet Documentation  Taken 09/19/2022 0600 by Cynda Acres, RN  Anti-Embolism Device Type: SCD, Knee  Anti-Embolism Intervention: Refused  Anti-Embolism Device Location: BLE  Taken 09/19/2022 0400 by Cynda Acres, RN  Anti-Embolism Device Type: SCD, Knee  Anti-Embolism Intervention: Refused  Anti-Embolism Device Location: BLE  Taken 09/19/2022 0200 by Cynda Acres, RN  Anti-Embolism Device Type: SCD, Knee  Anti-Embolism Intervention: Refused  Anti-Embolism Device Location: BLE  Taken 09/19/2022 0000 by Cynda Acres, RN  Anti-Embolism Device Type: SCD, Knee  Anti-Embolism Intervention: Refused  Anti-Embolism Device Location: BLE  Taken 09/18/2022 2200 by Cynda Acres, RN  Anti-Embolism Device Type: SCD, Knee  Anti-Embolism Intervention: Refused  Anti-Embolism Device Location: BLE  Taken 09/18/2022 2133 by Cynda Acres, RN  VTE Prevention/Management: anticoagulant therapy  Anti-Embolism Device Type: SCD, Knee  Anti-Embolism Intervention: Refused  Anti-Embolism Device Location: BLE  Taken 09/18/2022 2000 by Cynda Acres, RN  Anti-Embolism Device Type: SCD, Knee  Anti-Embolism Intervention: Refused  Anti-Embolism Device Location: BLE  Intervention: Prevent Infection  Recent Flowsheet Documentation  Taken 09/18/2022 2000 by Cynda Acres, RN  Infection Prevention:   cohorting utilized   environmental surveillance performed   equipment surfaces disinfected   hand hygiene promoted   personal protective equipment utilized   rest/sleep promoted   single patient room provided  Goal: Optimal Comfort and Wellbeing  Outcome: Progressing  Goal: Readiness for Transition of Care  Outcome: Progressing  Goal: Rounds/Family Conference  Outcome: Progressing     Problem: Fall Injury Risk  Goal: Absence of Fall and Fall-Related Injury  Outcome: Progressing  Intervention: Promote Scientist, clinical (histocompatibility and immunogenetics) Documentation  Taken 09/18/2022 2000 by Cynda Acres, RN  Safety Interventions:   fall reduction program maintained   lighting adjusted for tasks/safety   low bed   nonskid shoes/slippers when out of bed     Problem: Infection  Goal: Absence of Infection Signs and Symptoms  Outcome: Progressing  Intervention: Prevent or Manage Infection  Recent Flowsheet Documentation  Taken 09/18/2022 2000 by Cynda Acres, RN  Infection Management: aseptic technique maintained  Isolation Precautions: protective precautions maintained     Problem: Self-Care Deficit  Goal: Improved Ability to Complete Activities of Daily Living  Outcome: Progressing

## 2022-09-20 LAB — DECEASED DONOR CL I&II, LOW RES
DONOR LOW RES DRW #1: 52
DONOR LOW RES DRW #2: 52
DONOR LOW RES HLA A #1: 2
DONOR LOW RES HLA A #2: 26
DONOR LOW RES HLA B #1: 8
DONOR LOW RES HLA B #2: 62
DONOR LOW RES HLA BW #1: 6
DONOR LOW RES HLA BW #2: 6
DONOR LOW RES HLA C #1: 9
DONOR LOW RES HLA C #2: 7
DONOR LOW RES HLA DQ #1: 2
DONOR LOW RES HLA DQ #2: 7
DONOR LOW RES HLA DR #1: 17
DONOR LOW RES HLA DR #2: 11

## 2022-09-20 LAB — MAGNESIUM: MAGNESIUM: 1.9 mg/dL (ref 1.6–2.6)

## 2022-09-20 LAB — CBC
HEMATOCRIT: 31.9 % — ABNORMAL LOW (ref 39.0–48.0)
HEMOGLOBIN: 10.1 g/dL — ABNORMAL LOW (ref 12.9–16.5)
MEAN CORPUSCULAR HEMOGLOBIN CONC: 31.8 g/dL — ABNORMAL LOW (ref 32.0–36.0)
MEAN CORPUSCULAR HEMOGLOBIN: 29.8 pg (ref 25.9–32.4)
MEAN CORPUSCULAR VOLUME: 93.6 fL (ref 77.6–95.7)
MEAN PLATELET VOLUME: 7.9 fL (ref 6.8–10.7)
PLATELET COUNT: 209 10*9/L (ref 150–450)
RED BLOOD CELL COUNT: 3.41 10*12/L — ABNORMAL LOW (ref 4.26–5.60)
RED CELL DISTRIBUTION WIDTH: 18.7 % — ABNORMAL HIGH (ref 12.2–15.2)
WBC ADJUSTED: 8 10*9/L (ref 3.6–11.2)

## 2022-09-20 LAB — BASIC METABOLIC PANEL
ANION GAP: 3 mmol/L — ABNORMAL LOW (ref 5–14)
BLOOD UREA NITROGEN: 16 mg/dL (ref 9–23)
BUN / CREAT RATIO: 8
CALCIUM: 11.2 mg/dL — ABNORMAL HIGH (ref 8.7–10.4)
CHLORIDE: 113 mmol/L — ABNORMAL HIGH (ref 98–107)
CO2: 20 mmol/L (ref 20.0–31.0)
CREATININE: 2.08 mg/dL — ABNORMAL HIGH
EGFR CKD-EPI (2021) MALE: 41 mL/min/{1.73_m2} — ABNORMAL LOW (ref >=60)
GLUCOSE RANDOM: 85 mg/dL (ref 70–179)
POTASSIUM: 5.6 mmol/L — ABNORMAL HIGH (ref 3.4–4.8)
SODIUM: 136 mmol/L (ref 135–145)

## 2022-09-20 LAB — PHOSPHORUS: PHOSPHORUS: 2.6 mg/dL (ref 2.4–5.1)

## 2022-09-20 LAB — TACROLIMUS LEVEL, TROUGH: TACROLIMUS, TROUGH: 9.2 ng/mL (ref 5.0–15.0)

## 2022-09-20 MED ORDER — DOCUSATE SODIUM 100 MG TABLET
ORAL_TABLET | Freq: Two times a day (BID) | ORAL | 11 refills | 30 days | PRN
Start: 2022-09-20 — End: ?

## 2022-09-20 MED ORDER — PANTOPRAZOLE 40 MG TABLET,DELAYED RELEASE
ORAL_TABLET | Freq: Two times a day (BID) | ORAL | 11 refills | 30 days
Start: 2022-09-20 — End: ?

## 2022-09-20 MED ORDER — DIPHENHYDRAMINE 25 MG TABLET
Freq: Four times a day (QID) | ORAL | 0 refills | 8 days | PRN
Start: 2022-09-20 — End: ?

## 2022-09-20 MED ORDER — ASPIRIN 81 MG CHEWABLE TABLET
ORAL_TABLET | Freq: Every day | ORAL | 11 refills | 30 days
Start: 2022-09-20 — End: ?

## 2022-09-20 MED ORDER — MINOXIDIL 2.5 MG TABLET
ORAL_TABLET | Freq: Two times a day (BID) | ORAL | 11 refills | 15 days
Start: 2022-09-20 — End: ?

## 2022-09-20 MED ORDER — ALBUTEROL SULFATE HFA 90 MCG/ACTUATION AEROSOL INHALER
Freq: Four times a day (QID) | RESPIRATORY_TRACT | 11 refills | 0 days | PRN
Start: 2022-09-20 — End: ?

## 2022-09-20 MED ORDER — ACETAMINOPHEN 160 MG/5 ML (5 ML) ORAL SUSPENSION
Freq: Four times a day (QID) | ORAL | 0 refills | 2 days | PRN
Start: 2022-09-20 — End: ?

## 2022-09-20 MED ORDER — SUCRALFATE 100 MG/ML ORAL SUSPENSION
Freq: Two times a day (BID) | ORAL | 0 refills | 30 days
Start: 2022-09-20 — End: ?

## 2022-09-20 MED ORDER — PREDNISONE 5 MG TABLET
ORAL_TABLET | Freq: Every day | ORAL | 11 refills | 30 days
Start: 2022-09-20 — End: ?

## 2022-09-20 MED ORDER — CARVEDILOL 25 MG TABLET
ORAL_TABLET | Freq: Two times a day (BID) | ORAL | 11 refills | 30 days
Start: 2022-09-20 — End: ?

## 2022-09-20 MED ORDER — BICTEGRAVIR 50 MG-EMTRICITABINE 200 MG-TENOFOVIR ALAFENAM 25 MG TABLET
ORAL_TABLET | Freq: Every morning | ORAL | 11 refills | 30 days
Start: 2022-09-20 — End: ?

## 2022-09-20 MED ORDER — ONDANSETRON 4 MG DISINTEGRATING TABLET
ORAL_TABLET | Freq: Three times a day (TID) | 1 refills | 10 days | PRN
Start: 2022-09-20 — End: ?

## 2022-09-20 MED ORDER — TACROLIMUS 1 MG CAPSULE, IMMEDIATE-RELEASE
ORAL_CAPSULE | Freq: Two times a day (BID) | ORAL | 11 refills | 30.00000 days
Start: 2022-09-20 — End: ?

## 2022-09-20 MED ADMIN — hydrOXYzine (ATARAX) tablet 25 mg: 25 mg | ORAL | @ 14:00:00 | NDC 00641637515

## 2022-09-20 MED ADMIN — lidocaine 4 % patch 1 patch: 1 | TRANSDERMAL | @ 14:00:00 | NDC 54868389400

## 2022-09-20 MED ADMIN — azathioprine (IMURAN) tablet 150 mg: 150 mg | ORAL | @ 14:00:00 | NDC 72789012930

## 2022-09-20 MED ADMIN — diphenhydrAMINE (BENADRYL) capsule/tablet 25 mg: 25 mg | ORAL | @ 04:00:00 | NDC 97807007030

## 2022-09-20 MED ADMIN — valGANciclovir (VALCYTE) tablet 450 mg: 450 mg | ORAL | @ 14:00:00 | NDC 69097027703

## 2022-09-20 MED ADMIN — calamine-zinc oxide 8-8 % lotion: TOPICAL | @ 15:00:00

## 2022-09-20 MED ADMIN — bictegrav-emtricit-tenofov ala (BIKTARVY) 50-200-25 mg tablet 1 tablet: 1 | ORAL | @ 14:00:00 | NDC 61958250103

## 2022-09-20 MED ADMIN — carvedilol (COREG) tablet 50 mg: 50 mg | ORAL | @ 14:00:00 | NDC 82009012805

## 2022-09-20 MED ADMIN — diphenhydrAMINE (BENADRYL) injection: 12.5 mg | INTRAVENOUS | @ 15:00:00 | NDC 57866728801

## 2022-09-20 MED ADMIN — diphenhydrAMINE (BENADRYL) capsule/tablet 25 mg: 25 mg | ORAL | @ 11:00:00 | NDC 97807007030

## 2022-09-20 MED ADMIN — oxyCODONE (ROXICODONE) immediate release tablet 10 mg: 10 mg | ORAL | @ 01:00:00 | Stop: 2022-09-20 | NDC 71335142204

## 2022-09-20 MED ADMIN — hydrOXYzine (ATARAX) tablet 25 mg: 25 mg | ORAL | @ 04:00:00 | NDC 00641637515

## 2022-09-20 MED ADMIN — oxyCODONE (ROXICODONE) immediate release tablet 10 mg: 10 mg | ORAL | @ 20:00:00 | Stop: 2022-09-24 | NDC 71335142204

## 2022-09-20 MED ADMIN — diphenhydrAMINE (BENADRYL) injection: 12.5 mg | INTRAVENOUS | @ 22:00:00 | NDC 57866728801

## 2022-09-20 MED ADMIN — hydrOXYzine (ATARAX) tablet 25 mg: 25 mg | ORAL | @ 20:00:00 | NDC 00641637515

## 2022-09-20 MED ADMIN — oxyCODONE (ROXICODONE) immediate release tablet 10 mg: 10 mg | ORAL | @ 07:00:00 | Stop: 2022-09-24 | NDC 71335142204

## 2022-09-20 MED ADMIN — hydrocortisone 1 % cream: TOPICAL | @ 14:00:00 | NDC 71863011050

## 2022-09-20 MED ADMIN — diphenhydrAMINE (BENADRYL) injection: 12.5 mg | INTRAVENOUS | @ 09:00:00 | NDC 57866728801

## 2022-09-20 MED ADMIN — tacrolimus (PROGRAF) 11 mg combo product: 11 mg | ORAL | @ 22:00:00 | NDC 00469301601

## 2022-09-20 MED ADMIN — carvedilol (COREG) tablet 50 mg: 50 mg | ORAL | @ 01:00:00 | NDC 82009012805

## 2022-09-20 MED ADMIN — minoxidil (LONITEN) tablet 5 mg: 5 mg | ORAL | @ 01:00:00 | NDC 72789027901

## 2022-09-20 MED ADMIN — minoxidil (LONITEN) tablet 5 mg: 5 mg | ORAL | @ 14:00:00 | NDC 72789027901

## 2022-09-20 MED ADMIN — tacrolimus (PROGRAF) 11 mg combo product: 11 mg | ORAL | @ 11:00:00 | NDC 00469301601

## 2022-09-20 MED ADMIN — oxyCODONE (ROXICODONE) immediate release tablet 5 mg: 5 mg | ORAL | @ 04:00:00 | Stop: 2022-09-20 | NDC 72162174902

## 2022-09-20 MED ADMIN — pantoprazole (Protonix) EC tablet 40 mg: 40 mg | ORAL | @ 14:00:00 | NDC 82009001190

## 2022-09-20 MED ADMIN — pantoprazole (Protonix) EC tablet 40 mg: 40 mg | ORAL | @ 01:00:00 | NDC 82009001190

## 2022-09-20 MED ADMIN — predniSONE (DELTASONE) tablet 5 mg: 5 mg | ORAL | @ 14:00:00 | NDC 55289085902

## 2022-09-20 NOTE — Unmapped (Signed)
Transplant Surgery Progress Note    Assessment:     Christian Brown is a 42 y.o. 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 Induction with Solumedrol, Thymoglobulin    Interval Events:   NAEON except the Rt groin pain 8-9/10 which responded to stat oxy.. itching on the upper extremities was significantly improved. He had 13 beats of Vtach early in the morning in the telemetry. BP; 142/83. His RUO was satisfactory by the patient's report however not charted; had 5x urination.  Plan:     Neuro:   - Sch Tylenol, STOP Robaxin, Neurontin   - PRN Oxy   - PRN PO Benadryl    *Non-compliance:    - transplant psychologists visited him 7/22 and will continue CBT with him     CV:   - HDS, Maintain SBP < 180   - Coreg to 50 mg, home midoxinil 5 mg BID , (hydralazine 25 mg TID was discontinued as the possible cause of itching)    Pulm:   - Stable on room air  - Continue incentive spirometry, pulmonary toilet, out of bed as tolerated    GI:   - F: ML   - E: replace as needed, Hyperkalemic 5.6    - N: regular diet   - Protonix 40 BID , zofran, miralax, senna   - GI consult  7/14: No EGD at this point Protonix BID, GI recommendation on their follow up visit : 7/19 was to have a repeated EGD in two months in an outpatient settings   -MMF was substituted by Myfortic for possible pill induced esophagitis 7/19 and discontinued 7/22   -GI pathology  09/14/2022 was followed up today and mentioned:     - Negative for CMV and candida esophagitis    - Thought to be pill esophagitis per GI   - KUB 7/14: moderate colonic stool burden, no obstruction   - EGD ON 7/16:  was performed for him and the results revealed severe esophagitis and gastritis with no active bleeding lesion. So Protonix IV was started for him in addition to Carafate. A 10mm non-bleeding linear gastric ulcer.        GU:   - Passed TOV, voiding spontaneously.    -Satisfactory RUO (Not measured according to not reporting to the nurse after voiding)    Endo:   - no acute issues    - has primary  parathyroidism   - Considered Sensipar but patient reports allergy   - Considered NS bolus and IV lasix but patient refused   - iCal 6.38 7/22     Heme/ID:   - Afebrile, WBC was increased 8 from 5.9 yesterday.  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     Input/Output:  I/O last 24 hr:  In:  70\\P.O.:60 I.V.:10]  Out: 5x not documented      BM: 0 X     Physical Exam:     General: Intermittently cooperative, no distress, well appearing male  Pulmonary: Normal work of breathing  Cardiovascular: Regular rate, NSR  Abdomen: Non-tender, not distended, incision dry and clean, well healing. Some swelling around incision but no signs of fluid collection or infection.  Musculoskeletal: Moves extremities spontaneously   Neurologic: Alert, grossly intact    Labs:  Lab Results   Component Value Date    WBC 8.0 09/20/2022    HGB 10.1 (L) 09/20/2022    HCT 31.9 (L) 09/20/2022    PLT 209 09/20/2022       Lab Results   Component Value Date    NA 136 09/20/2022    K 5.6 (H) 09/20/2022    CL 113 (H) 09/20/2022    CO2 20.0 09/20/2022    BUN 16 09/20/2022    CREATININE 2.08 (H) 09/20/2022    GLU 85 09/20/2022    CALCIUM 11.2 (H) 09/20/2022    MG 1.9 09/20/2022    PHOS 2.6 09/20/2022       Lab Results   Component Value Date    BILITOT 0.8 09/05/2022    BILIDIR 0.20 01/17/2022    PROT 8.0 09/05/2022    ALBUMIN 3.6 09/05/2022    ALT <7 (L) 09/05/2022    AST 14 09/05/2022    ALKPHOS 249 (H) 09/05/2022    GGT 15 09/05/2022       Lab Results   Component Value Date    PT 10.9 09/05/2022    INR 0.97 09/05/2022    APTT 39.9 (H) 09/05/2022      Tac level: 4.6-- 4.6--7.6 ---today; not released yet  Ionized ca: 5.8-- 6.15 ; 7/22    Total calcium: 11.3--11.2 today  Mg: 1.7--1.9 today  P: 2.4--2.6 today        Microbiology Results (last day)       Procedure Component Value Date/Time Date/Time    COVID-19 PCR [[id number]]  (Normal) Collected: 09/05/22 1145    Lab Status: Final result Specimen: Nasopharyngeal Swab Updated: 09/05/22 1258     SARS-CoV-2 PCR Negative    Narrative:      This test was performed using the Cepheid Xpert Xpress SARS-CoV-2 assay which has been validated by the CLIA-certified, CAP-inspected [facility] Clinical Molecular Micro[facility]. FDA has granted Emergency Use Authorization for this test. This real-time RT-PCR test detects SARS-CoV-2 by targeting the N2 and E genes. Negative results do not preclude SARS-CoV-2 infection and should not be used as the sole basis for patient management decisions. Negative results must be combined with clinical observations, patient history, and epidemiological information. Information for providers and patients can be found here: [email or web address]      Urine Culture [[id number]]     Lab Status: No result Specimen: Urine from Clean Catch             Imaging:  All pertinent imaging personally reviewed.    Bernestine Amass (MD)  General Surgery resident  PGY1

## 2022-09-20 NOTE — Unmapped (Signed)
Transplant Surgery Progress Note    Assessment:     Christian Brown is a 42 y.o. 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 Induction with Solumedrol, Thymoglobulin    Interval Events:   NAEON except the continuous itching on the upper extremities. Reported 8/10 Rt groin pain which was responsive to the MMPC. His RUO was satisfactory by the patient's report however not charted.   Plan:     Neuro:   - Sch Tylenol, STOP Robaxin, Neurontin   - PRN Oxy   - PRN PO Benadryl    *Non-compliance:    - transplant psychologists visited him 7/22 and will continue CBT with him     CV:   - HDS, Maintain SBP < 180   - Coreg to 50 mg, home midoxinil 5 mg BID , (hydralazine 25 mg TID was discontinued as the possible cause of itching)    Pulm:   - Stable on room air  - Continue incentive spirometry, pulmonary toilet, out of bed as tolerated    GI:   - F: ML   - E: replace as needed, 500 ml bolus NS stat   - N: regular diet   - Protonix 40 BID , zofran, miralax, senna   - GI consult  7/14: No EGD at this point Protonix BID, GI recommendation on their follow up visit : 7/19 was to have a repeated EGD in two months in an outpatient settings   -MMF was substituted by Myfortic for possible pill induced esophagitis 7/19 and discontinued 7/22   -GI pathology  09/14/2022 was followed up today and mentioned:     - Negative for CMV and candida esophagitis    - Thought to be pill esophagitis per GI   - KUB 7/14: moderate colonic stool burden, no obstruction   - EGD ON 7/16:  was performed for him and the results revealed severe esophagitis and gastritis with no active bleeding lesion. So Protonix IV was started for him in addition to Carafate. A 10mm non-bleeding linear gastric ulcer.        GU:   - Passed TOV, voiding spontaneously.    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     Input/Output:  I/O last 24 hr:  In:  490[P.O.:480 I.V.:10]  Out: 1 x not documented      BM: 1X     Physical Exam:     General: Intermittently cooperative, no distress, well appearing male  Pulmonary: Normal work of breathing  Cardiovascular: Regular rate, NSR  Abdomen: Non-tender, not distended, incision dry and clean, well healing. Some swelling around incision but no signs of fluid collection or infection.  Musculoskeletal: Moves extremities spontaneously   Neurologic: Alert, grossly intact    Labs:  Lab Results   Component Value Date    WBC 5.9 09/19/2022    HGB 9.8 (L) 09/19/2022    HCT 30.3 (L) 09/19/2022    PLT 197 09/19/2022       Lab Results   Component Value Date    NA 138 09/19/2022    K 5.4 (H) 09/19/2022    CL 113 (H) 09/19/2022    CO2 22.0 09/19/2022    BUN 15 09/19/2022    CREATININE 2.13 (H) 09/19/2022    GLU 84 09/19/2022    CALCIUM 11.3 (H) 09/19/2022    MG 1.7 09/19/2022    PHOS 2.4 09/19/2022       Lab Results   Component Value Date    BILITOT 0.8 09/05/2022    BILIDIR 0.20 01/17/2022    PROT 8.0 09/05/2022    ALBUMIN 3.6 09/05/2022    ALT <7 (L) 09/05/2022    AST 14 09/05/2022    ALKPHOS 249 (H) 09/05/2022    GGT 15 09/05/2022       Lab Results   Component Value Date    PT 10.9 09/05/2022 INR 0.97 09/05/2022    APTT 39.9 (H) 09/05/2022      Tac level: 4.6-- 4.6--7.6 today  Ionized ca: 5.8-- 6.15 ; 7/22    Total calcium: 11.3  Mg: 1.7  P: 2.4        Microbiology Results (last day)       Procedure Component Value Date/Time Date/Time    COVID-19 PCR [[id number]]  (Normal) Collected: 09/05/22 1145    Lab Status: Final result Specimen: Nasopharyngeal Swab Updated: 09/05/22 1258     SARS-CoV-2 PCR Negative    Narrative:      This test was performed using the Cepheid Xpert Xpress SARS-CoV-2 assay which has been validated by the CLIA-certified, CAP-inspected [facility] Clinical Molecular Microbiology [facility]. FDA has granted Emergency Use Authorization for this test. This real-time RT-PCR test detects SARS-CoV-2 by targeting the N2 and E genes. Negative results do not preclude SARS-CoV-2 infection and should not be used as the sole basis for patient management decisions. Negative results must be combined with clinical observations, patient history, and epidemiological information. Information for providers and patients can be found here: [email or web address]      Urine Culture [[id number]]     Lab Status: No result Specimen: Urine from Clean Catch             Imaging:  All pertinent imaging personally reviewed.

## 2022-09-20 NOTE — Unmapped (Shared)
Physician Discharge Summary    Admit date: 09/05/2022    Discharge date and time: ***    Discharge to: ***    Discharge Service: Surg Transplant [facility])    Discharge Attending Physician: Leona Carry, MD    Discharge Diagnoses: Post Renal Transplant    Procedures: DDKT    Pertinent Test Results:   labs:   Lab Results   Component Value Date    WBC 8.0 09/20/2022    HGB 10.1 (L) 09/20/2022    HCT 31.9 (L) 09/20/2022    PLT 209 09/20/2022       Lab Results   Component Value Date    NA 136 09/20/2022    K 5.6 (H) 09/20/2022    CL 113 (H) 09/20/2022    CO2 20.0 09/20/2022    BUN 16 09/20/2022    CREATININE 2.08 (H) 09/20/2022    GLU 85 09/20/2022    CALCIUM 11.2 (H) 09/20/2022    MG 1.9 09/20/2022    PHOS 2.6 09/20/2022       Lab Results   Component Value Date    BILITOT 0.8 09/05/2022    BILIDIR 0.20 01/17/2022    PROT 8.0 09/05/2022    ALBUMIN 3.6 09/05/2022    ALT <7 (L) 09/05/2022    AST 14 09/05/2022    ALKPHOS 249 (H) 09/05/2022    GGT 15 09/05/2022       Lab Results   Component Value Date    PT 10.9 09/05/2022    INR 0.97 09/05/2022    APTT 39.9 (H) 09/05/2022        UA:     Latest Reference Range & Units 09/17/22 10:27   Creatinine, Urine Undefined mg/dL 161.0   Protein Urine Random Undefined mg/dL 96.0   Protein/Creatinine Ratio, Urine Undefined  0.356      Latest Reference Range & Units 09/05/22 11:44   RPR Nonreactive  Nonreactive   CMV IgG Negative  Positive !   EBV IgG Negative  Positive !   HSV 1 IgG Negative  Positive !   HSV 2 IgG Negative  Positive !   Varicella IgG  Positive   Hep B Surface Ag Nonreactive  Nonreactive   Hep B S Ab Nonreactive, Grayzone  Reactive !   Hep B Surf Ab Quant <8.00 m(IU)/mL 40.18 (H)   Hep B Core Total Ab Nonreactive  Nonreactive   Hepatitis C Ab Nonreactive  Nonreactive   HCV RNA Not Detected  Not Detected   HIV Antigen/Antibody Combo Nonreactive  See Comment for Test Results !   HIV AB Supplemental Interpretation Nonreactive  Reactive for HIV-1 antibodies !!   HIV RNA <0 copies/mL <20 (H)   HIV RNA Quant Result Not Detected  Detected !   HIV Result Interpretation  See results in comment field      Latest Reference Range & Units 07/29/22 20:35 07/30/22 01:48 09/05/22 15:03 09/13/22 23:37 09/14/22 04:48 09/15/22 06:42   hsTroponin I <=53 ng/L 65 (HH) 69 (HH) 135 (HH) 88 (HH) 87 (HH)  86 (HH) 72 (HH)       EGD Pathology: 09/12/2022  A: Esophagus, biopsy  - Acute ulcerative esophagitis with granulation tissue and extensive fibrinopurulent debris with intermixed polarizable and non-polarizable foreign material, cannot exclude pill esophagitis  - No viral cytopathic effect identified by H&E, CMV, or HSV I+II stains  - No fungal organisms identified by H&E or GMS stain  - No dysplasia identified     B: Stomach, biopsy  -  Oxyntic and antral-type mucosa with acute erosive gastropathy and associated mild acute and chronic inflammation  - No viral cytopathic effect identified by H&E or CMV immunostain  - No Helicobacter organisms identified by H&E or immunostain  - No intestinal metaplasia or dysplasia identified  - Iron stain is negative      Hospital Course:  Christian Brown is a 42 y.o. 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 Norma Fredrickson.        The patient was taken to the OR on 09/05/2022 for left DDKT with ureterocystostomy . He tolerated the procedure well, was extubated in the OR, and was taken to the PACU where He received routine postoperative care. He was then transferred to the Surgical Stepdown unit for close observation and cardiorespiratory monitoring. The allograft was evaluated with ultrasonography and found to have Perirenal free fluid, favor postoperative.  Due to patient condition and respiratory motion evaluation of the main renal artery anastomosis is extremely limited. Within these constraints, peak systolic velocity at the anastomosis was slightly elevated, likely reflect postoperative edema. Transplant kidney demonstrates satisfactory perfusion. Recommend short interval follow-up to reevaluate anastomosis.   --   . He received routine IV fluid replacement based on urine output.     He was clinically stable postoperatively, maintaining adequate urine output, and was transferred to the floor on POD# 1 . He did well thereafter and diet was slowly advanced. At the time of discharge patient was tolerating a regular diet, had pain controlled with PO pain medication, and able to return to the preoperative ambulatory status. [name] Post transplant course notable for hypertensive urgency requiring ICU transfer, development of esophagitis and gastritis. Serum HSV, CMV negative.     On 09/12/2022 He went under EGD evalusation and severe esophagitis and gasteritis were diagnosed for him. Patient describes burning chest/epigastric pain since transplant surgery significantly affecting PO intake. EGD 7/16 showed severe esophagitis in the middle and distal third of the esophagus. Biopsies show non-specific severe esophagitis and gastropathy without evidence of viral or fungal infection. Given the time course of onset after renal transplant and severity of inflammation, query if esophagitis was recommended to be due to mycophenolate. Therefore Cellcept was substituted to another brand.IV Protonix BID and Carafate and Pepsid were started for him but non were successful in controlling his Heart burn.   During his hospital course he experienced intermittent atypical chest pain with similar ECG pattern as his prior ECG and prior elevated Troponin with decreasing pattern.   Anti-rejection medication levels were monitored and dosages adjusted to maintain a therapeutic regimen with the input of Pharmacy. The patient was seen and assessed by Physical Therapy and deemed suitable for discharge. He will be discharged home on POD#16 in stable condition without home healthcare.    JP drain was removed.    Incision: staples CDI x 4-6 weeks post op  UOP: ***ml at dc  -Passed TOV on POD 4 7/13  Graft Function: s/p DDKT (DCD, KDPI 18%,)with Dr. Norma Fredrickson,  CIT ***, cPRA 7%, CMV positive, EBV positive, HCV Ab negative/ RNA negative, HBsAg negative, cAb negative/sAb positive, HIV positive, RPR negative, HSV-1 positive, HSV-2 positive, VZV IgG positive, Rubella IgG positive, Toxo IgG negative   -post op course c/b DGF? Immediate graft function  Immunosuppression:  -Induction: {Immunosuppression Induction:96661}  -Tac at dc: ***mg BID  -Cellcept at dc: ***mg BID  -Pred at dc: ***  Prophylaxis/ID:  -PJP: {PJP ppx:96663}  -  CMV: {CMV ppx:96662}  -ASA 81 at dc: ***      [ ]  L. Kim referral at DC for parathyroid    His diet was slowly advanced and at the time of discharge he was tolerating a regular diet. He was able to void spontaneously and post op pain was well-controlled with P.O. pain medication. He was assessed by Physical Therapy and Occupational Therapy and found to be suitable for discharge to ***. He was discharged *** in stable condition with a prescription for oral pain medications and scheduled to follow up with [place] in *** weeks.    At the time of discharge the final surgical pathology was still pending.***          An echocardiogram on 09/08/22 showed LVEF 55% from 65%, new moderate mitral stenosis, and elevated R atrial pressure. Cardiology was consulted and recommended outpatient follow-up in the setting of evident long-term compensation.    09/06/2022- POD1: The patient ws HDS. Complained about 8-9/10 pain over the sabdominal surgical incision. The Drain had 70 ml  output, UOP: 700, intraoperative Blood loss: 150, intake: 589 piggyback, po 120 ml.    7/10 RUS: Trace free fluid in the RUQ, No perinephric collection, no hydronephrosis  He received one dose of Thymoglubulin as the induction immune suppressor medication in addition to methylprednisolone and is currently on 125 mg Solumedrol, 150 Thymoglobulin.     09/07/2022- POD2: The patient was HDS and tolerated regular diet this morning. Over last night he had  8/10 incisional site pain and two episodes of high blood pressuer 164/95, HR 103. Intake: 1130, UOP: 1020, Drain output: 105. He had normal 1x BM.     Infectious disease  CONSULT 09/07/2022:  Check HIV VL and CD4 labs as follows:  At first post-hospital visit (~4 weeks)  Within 30 days of any med changes expected to alter ART levels  With any major change in health status (i.e. hospitalizations)  At 60-90 day intervals for 12 months after transplant  At 90-120 day intervals afterwards if fully suppressed and transplant condition is stable (per HIV guidelines)            Condition at Discharge: good  Discharge Medications:      Your Medication List        START taking these medications      mycophenolate 180 MG EC tablet  Commonly known as: MYFORTIC  Take 4 tablets (720 mg total) by mouth two (2) times a day.     tacrolimus 1 MG capsule  Commonly known as: PROGRAF  Take 5 capsules (5 mg total) by mouth two (2) times a day.     valGANciclovir 450 mg tablet  Commonly known as: VALCYTE  Take 1 tablet (450 mg total) by mouth daily.            ASK your doctor about these medications      acetaminophen 500 MG tablet  Commonly known as: TYLENOL  Take 2 tablets (1,000 mg total) by mouth every eight (8) hours for 5 days. Take 1000 mg every 8 hours for 5 days scheduled, and then you can change to as needed  Ask about: Should I take this medication?     albuterol 90 mcg/actuation inhaler  Commonly known as: PROVENTIL HFA;VENTOLIN HFA  Inhale 2 puffs every six (6) hours as needed.     aspirin 81 MG chewable tablet  Chew 1 tablet (81 mg total) daily.     bictegrav-emtricit-tenofov ala 50-200-25 mg tablet  Commonly known as: BIKTARVY  Take 1 tablet by mouth every morning.     calcium carbonate 200 mg calcium (500 mg) chewable tablet  Commonly known as: TUMS  Chew 2 tablets (1,000 mg total) daily as needed. Takes prn per patient     carvedilol 25 MG tablet  Commonly known as: COREG  Take 1 tablet (25 mg total) by mouth two (2) times a day.     cholecalciferol (vitamin D3-50 mcg (2,000 unit)) 50 mcg (2,000 unit) tablet  Take 1 tablet (50 mcg total) by mouth daily.     diclofenac sodium 1 % gel  Commonly known as: VOLTAREN  Apply 2 g topically four (4) times a day     ergocalciferol-1,250 mcg (50,000 unit) 1,250 mcg (50,000 unit) capsule  Commonly known as: DRISDOL  Take 1 capsule (1,250 mcg total) by mouth Every Monday, Wednesday, and Friday.     HYDROcodone-acetaminophen 5-325 mg per tablet  Commonly known as: NORCO  Take 1 tablet by mouth every six (6) hours as needed for pain.     ipratropium-albuterol 0.5-2.5 mg/3 mL nebulizer  Commonly known as: DUO-NEB  Inhale 3 mL by nebulization every six (6) hours as needed (SOB or wheeze).     minoxidil 2.5 MG tablet  Commonly known as: LONITEN  Take 1 tablet (2.5 mg total) by mouth every morning.     naloxone 4 mg/actuation nasal spray  Commonly known as: NARCAN  SPRAY TO 1 NOSTRIL IN CASE OF OPIOID OVERDOSE, MAY REPEAT IN 2-3 MINUTES UNTIL EMS ARRIVES OR RESPONSIVE     pantoprazole 40 MG tablet  Commonly known as: Protonix  Take 1 tablet (40 mg total) by mouth two (2) times a day.     valsartan 160 MG tablet  Commonly known as: DIOVAN  Take 1 tablet (160 mg total) by mouth every morning.              Pending Test Results:     Pending Labs       Order Current Status    Thiopurine Methyltransferase (TPMT) In process            Discharge Instructions:     Activity: Do not lift > 10-15 lbs for 1st 6 weeks, then gradually increase to 25 lbs over the following 6 weeks. Resume heavy lifting only after being cleared to do so at follow-up appointment in Transplant Surgery clinic.    Diet: Regular    Other Instructions: Follow up as an outpatient with ENT clinic for your hyperparathyroidism status and with the Gastroenterology clinic for your severe gastritis and esophagitis. They previously recommended repeating endoscopy (EGD) two months post discharge.     Your Building services engineer is [employer]. Contact your transplant coordinator or the Transplant Surgery Office [phone]) during business hours or page the transplant coordinator on call [phone])/ SRF resident (transplant surgery resident) on call [phone]) (choose one- all non-transplant recipients should page the [facility] resident) after business hours for:    - fever >100.5 degrees F by mouth, any fever with shaking chills, or other signs or symptoms of infection   - uncontrolled nausea, vomiting, or diarrhea; inability to have a bowel movement for > 3 days.   - any problem that prevents taking medications as scheduled.   - pain uncontrolled with prescribed medication or new pain or tenderness at the surgical site   - sudden weight gain or increase in blood pressure (greater than 140/85)   - shortness of breath, chest pain /  discomfort   - new or increasing jaundice   - urinary symptoms including pain / difficulty / burning or tea-colored urine   - any other new or concerning symptoms   - questions regarding your medications or continuing care      1. Patient may shower, but should not immerse wounds in bath or pool for 2-3 weeks. Wash the surgical site with mild soap and water, but do not scrub vigorously.    2. You may dress wounds with dry gauze and tape to avoid soilage.    3. Do not drive or operate heavy machinery prior to MD clearance, or at any time while taking narcotics.    4. Inspect surgical sites at least twice daily, contact Transplant Coordinator for spreading redness, purulent discharge, or increasing bleeding or drainage, or for separation of wounds.     5. Maintain a written record of daily vital signs, per Handbook instructions.     6. Maintain a written record of medications taken and review against the discharge medications sheet (orange paper). Periodically review your Transplant Handbook for important information regarding postoperative care and required precautions.      Labs and Other Follow-ups after Discharge:     Kidney  Labs 3x week: CBC, BMP, Mg, Phos and ***Tacrolimus/Cyclosporine (pick one) trough level***  Labs every 3 months: Hepatic function panel    Transplant Coordinator: Celene Squibb   [phone].       Activity: Do not lift > 10-15 lbs for 1st 6 weeks, then gradually increase to 25 lbs over the following 6 weeks. Resume heavy lifting only after being cleared to do so at follow-up appointment in Transplant Surgery clinic.        Appointments which have been scheduled for you      Sep 22, 2022 9:30 AM  (Arrive by 9:20 AM)  RETURN HCP TELEPHONE with Eliezer Bottom, LCSW  [facility] KIDNEY TRANSPLANT [place] [facility] REGION) [address]  [place] [address]  [phone]        Sep 29, 2022 12:00 PM  (Arrive by 11:45 AM)  LAB ONLY with [facility] ONLY  LAB [place] [facility] REGION) [address]  [facility] 1 through 4  [place] [address]        Sep 29, 2022 2:00 PM  (Arrive by 1:45 PM)  RETURN PHARMD with KIDNEY TRANSPLANT PHARMACY  [facility] KIDNEY TRANSPLANT [place] [facility]ION) [address]  [facility] 1 through 4  [place] [address]  [phone]        Sep 29, 2022 2:30 PM  (Arrive by 2:15 PM)  RETURN NEPHROLOGY POST with Leafy Half, MD  [facility] KIDNEY TRANSPLANT [place] [facility] REGION) [address]  [facility] 1 through 4  [place] [address]  [phone]        Oct 06, 2022 12:30 PM  (Arrive by 12:15 PM)  HOSPITAL FOLLOW UP with Pamalee Leyden, MD  [facility] GI MEDICINE [place] [facility] REGION) [address]  [facility] 1 through 4  [place] [address]  [phone]        Oct 10, 2022 11:15 AM  (Arrive by 10:45 AM)  CYSTO STENT REMOVAL with [facility] PROCEDURES  [facility] UROLOGY PROCEDURES [place] [facility] REGION) [address]  [place] [address]  [phone]        Nov 02, 2022 12:30  PM  (Arrive by 12:15 PM)  RETURN ICID/TRANSPLANT with Audley Hose, MD  [facility] INFECTIOUS DISEASES [place] [facility] REGION) [address]  [facility] 1 through 4  [place] [address]  [phone]        Nov 30, 2022 10:00 AM  (Arrive by 9:45 AM)  RETURN ICID/TRANSPLANT with Audley Hose, MD  [facility] INFECTIOUS DISEASES [place] [facility] REGION) [address]  [facility] 1 through 4  [place] [address]  [phone]                I spent greater than 30 minutes in the discharge of this patient.

## 2022-09-20 NOTE — Unmapped (Signed)
Alert & oriented x4, vital signs stable on room air, with the exception of one run of vtach   13 beats of v tach per tele, pt without s/sx  Pain managed with use of PRN oxycodone   MD notified once for uncontrolled pain, one-time oxycodone administered  Patient up ad lib, ambulating independently, voiding spontaneously  Unable to measure urine, pt declining use of urinal   LBM 7/23, pt tolerating regular diet without nausea/vomiting   Abdominal staples clean;dry;intact   Pt received multiple prn medications for itching  No questions or concerns at this time    Problem: Wound  Goal: Optimal Coping  Outcome: Progressing  Goal: Optimal Functional Ability  Outcome: Progressing  Intervention: Optimize Functional Ability  Recent Flowsheet Documentation  Taken 09/19/2022 2000 by Cynda Acres, RN  Activity Management: up ad lib  Goal: Absence of Infection Signs and Symptoms  Outcome: Progressing  Intervention: Prevent or Manage Infection  Recent Flowsheet Documentation  Taken 09/19/2022 2000 by Cynda Acres, RN  Infection Management: aseptic technique maintained  Isolation Precautions: protective precautions maintained  Goal: Improved Oral Intake  Outcome: Progressing  Goal: Optimal Pain Control and Function  Outcome: Progressing  Goal: Skin Health and Integrity  Outcome: Progressing  Intervention: Optimize Skin Protection  Recent Flowsheet Documentation  Taken 09/19/2022 2000 by Cynda Acres, RN  Activity Management: up ad lib  Pressure Reduction Techniques:   frequent weight shift encouraged   heels elevated off bed  Head of Bed (HOB) Positioning: HOB at 20-30 degrees  Pressure Reduction Devices: pressure-redistributing mattress utilized  Skin Protection: adhesive use limited  Goal: Optimal Wound Healing  Outcome: Progressing     Problem: Adult Inpatient Plan of Care  Goal: Plan of Care Review  Outcome: Progressing  Goal: Patient-Specific Goal (Individualized)  Outcome: Progressing  Goal: Absence of Hospital-Acquired Illness or Injury  Outcome: Progressing  Intervention: Identify and Manage Fall Risk  Recent Flowsheet Documentation  Taken 09/19/2022 2000 by Cynda Acres, RN  Safety Interventions:   fall reduction program maintained   lighting adjusted for tasks/safety   low bed   nonskid shoes/slippers when out of bed  Intervention: Prevent Skin Injury  Recent Flowsheet Documentation  Taken 09/19/2022 2000 by Cynda Acres, RN  Positioning for Skin: Supine/Back  Device Skin Pressure Protection:   absorbent pad utilized/changed   adhesive use limited  Skin Protection: adhesive use limited  Intervention: Prevent and Manage VTE (Venous Thromboembolism) Risk  Recent Flowsheet Documentation  Taken 09/20/2022 0400 by Cynda Acres, RN  Anti-Embolism Device Type: SCD, Knee  Anti-Embolism Intervention: Refused  Anti-Embolism Device Location: BLE  Taken 09/20/2022 0200 by Cynda Acres, RN  Anti-Embolism Device Type: SCD, Knee  Anti-Embolism Intervention: Refused  Anti-Embolism Device Location: BLE  Taken 09/20/2022 0000 by Cynda Acres, RN  Anti-Embolism Device Type: SCD, Knee  Anti-Embolism Intervention: Refused  Anti-Embolism Device Location: BLE  Taken 09/19/2022 2200 by Cynda Acres, RN  Anti-Embolism Device Type: SCD, Knee  Anti-Embolism Intervention: Refused  Anti-Embolism Device Location: BLE  Taken 09/19/2022 2130 by Cynda Acres, RN  VTE Prevention/Management: ambulation promoted  Anti-Embolism Device Type: SCD, Knee  Anti-Embolism Intervention: Refused  Anti-Embolism Device Location: BLE  Taken 09/19/2022 2000 by Cynda Acres, RN  Anti-Embolism Device Type: SCD, Knee  Anti-Embolism Intervention: Refused  Anti-Embolism Device Location: BLE  Intervention: Prevent Infection  Recent Flowsheet Documentation  Taken 09/19/2022 2000 by Cynda Acres, RN  Infection Prevention:   cohorting utilized  environmental surveillance performed   equipment surfaces disinfected   hand hygiene promoted   personal protective equipment utilized rest/sleep promoted   single patient room provided  Goal: Optimal Comfort and Wellbeing  Outcome: Progressing  Goal: Readiness for Transition of Care  Outcome: Progressing  Goal: Rounds/Family Conference  Outcome: Progressing     Problem: Fall Injury Risk  Goal: Absence of Fall and Fall-Related Injury  Outcome: Progressing  Intervention: Promote Scientist, clinical (histocompatibility and immunogenetics) Documentation  Taken 09/19/2022 2000 by Cynda Acres, RN  Safety Interventions:   fall reduction program maintained   lighting adjusted for tasks/safety   low bed   nonskid shoes/slippers when out of bed     Problem: Infection  Goal: Absence of Infection Signs and Symptoms  Outcome: Progressing  Intervention: Prevent or Manage Infection  Recent Flowsheet Documentation  Taken 09/19/2022 2000 by Cynda Acres, RN  Infection Management: aseptic technique maintained  Isolation Precautions: protective precautions maintained     Problem: Self-Care Deficit  Goal: Improved Ability to Complete Activities of Daily Living  Outcome: Progressing

## 2022-09-20 NOTE — Unmapped (Signed)
CONFIDENTIAL PSYCHOLOGICAL DOCUMENTATION    Writer stopped by to check in with Christian Brown at the request of Dr. Norlene Duel. He is s/p DDKT on 09/05/22 and was seen by writer initially on 09/18/22 (see that report for more details). Given some ongoing concerns about adherence and declining procedures (e.g. not using urinal to track urine output), writer was asked to check in with pt again. He was seen at bedside alone.    Christian Brown reported that he is hopeful to be discharged tomorrow, and feeling better overall. He continues to experience gastritis and pain when swallowing, with mild improvements; he has been able to eat oatmeal with limited distress. 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     Christian Brown was asked specifically about concerns with declining requests from his medical team, like not using the urinal to measure urine output. He reported that he has received mixed messages about the need for this, with some providers stating that if he is urinating every hour it is not necessary to measure exactly. He feels like overall he is working better with his medical team now than he was initially. He is also aware that after discharge, measuring I/O at home is essential, and he reported that he will do this at home.     Christian Brown was reminded that he may reach out to transplant psychology at any time in the future if he would like additional support.     Theda Sers, PhD  Clinical Psychologist

## 2022-09-20 NOTE — Unmapped (Signed)
Transplant Nephrology Follow-Up Consult note      Assessment/Recommendations: Christian Brown is a 42 y.o. 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   - Continue azathioprine 150 mg daily.  - Continue tacrolimus (trough target 8-10)    # Hyperkalemia  - Potassium of 5.6 today, unlikely to tolerate lokelma given GI symptoms  - Recommend sodium bicarbonate 650 mg bid     # Hypercalcemia  # Secondary hyperparathyroidism s/p 3-gland parathyroidectomy in 2021  - PTH elevated to 2000-3000 with calcium now around 11 after transplant (previously normal)  - Previously evaluated by endocrinology who did not feel the patient would benefit from further parathyroidectomy  - Has a documented allergy to cinacalcet    # Esophagitis   - EGD 09/12/2022: EGD showed severe esophagitis in the middle and distal third of the esophagus. Biopsies were taken for histology and viral PCR. Diffuse severe inflammation was also seen in the stomach which was biopsied. A 10mm non-bleeding linear gastric ulcer was also seen. Pathology with acute ulcerative esophagitis with foreign material and acute erosive gastropathy, concern for pill esophagitis    - BID PPI and liquid carafate  - mycophenolate stopped on 7/22, and transitioned to imuran  - still symptomatic, but improved from prior    # Normocytic anemia  - Hgb stable at around 10    # Infectious Prophylaxis and Monitoring:   - HIV - biktarvy  - Nystatin (thrush ppx), valgangciclovir (CMV ppx), famotidine    # Blood Pressure / Volume:  - Controlled on carvedilol 50 bid and minoxidil 5 mg bid  - Hydralazine discontinued due to itching     Transplant info  Recipient Labs: cPRA 7%, CMV positive, EBV positive, HCV Ab negative/ RNA negative, HBsAg negative, cAb negative/sAb positive, HIV positive, RPR negative, HSV-1 positive, HSV-2 positive, VZV IgG positive, Rubella IgG positive, Toxo IgG negative     DONOR INFORMATION:   ABO: O  HLA: A: 2, 26  B: 8, 62  DR: 17, 11  KDPI: 19  Cr: initial 0.95, peak: 1.92, terminal: 1.38  Status: DCD    **Transplant patients with an open wound require wound care with sterile water only. The patient should be counseled on this at the time of discharge if they have not already been doing this.**     Patient was seen and discussed with attending nephrologist Dr. Margaretmary Bayley. Recommendations were discussed with the primary team. Nephrology will continue to follow.    Thank you for this consult.   Please page the renal fellow on call with questions/concerns.    Worthy Keeler, DO  Division of Nephrology and Hypertension  [facility]  09/20/2022    ___________________________________________________________    Subjective/Interval History:   - continues to have burning and  pain in the epigastric region     Medications:   Current Facility-Administered Medications   Medication Dose Route Frequency Provider Last Rate Last Admin    acetaminophen (TYLENOL) oral liquid  1,000 mg Oral Q8H Kapoor, Kunal, MD        albuterol 2.5 mg /3 mL (0.083 %) nebulizer solution 2.5 mg  2.5 mg Nebulization Q6H PRN Norlene Duel, MD   2.5 mg at 09/15/22 1240    [START ON 10/05/2022] atovaquone (MEPRON) oral suspension  1,500 mg Oral Daily Kapoor, Pershing Proud, MD        azathioprine (IMURAN) tablet 150 mg  150 mg Oral Daily Mathis Dad, MD   150 mg at 09/20/22 0939    bictegrav-emtricit-tenofov ala (BIKTARVY) 50-200-25 mg tablet 1 tablet  1 tablet Oral Daily Norlene Duel, MD   1 tablet at 09/20/22 0940    bisacodyl (DULCOLAX) suppository 10 mg  10 mg Rectal Daily PRN Norlene Duel, MD   10 mg at 09/11/22 2103    bismuth subsalicylate (PEPTO-BISMOL) oral suspension  30 mL Oral Q6H PRN Norlene Duel, MD   30 mL at 09/09/22 2136    calamine-zinc oxide 8-8 % lotion   Topical QID Norlene Duel, MD   Given at 09/20/22 1113    carvedilol (COREG) tablet 50 mg  50 mg Oral BID Trevor Iha, DO   50 mg at 09/20/22 9518    diphenhydrAMINE (BENADRYL) capsule/tablet 25 mg  25 mg Oral Q6H PRN Ermelinda Das, Aidin, MD   25 mg at 09/20/22 8416    diphenhydrAMINE (BENADRYL) injection  12.5 mg Intravenous Q6H PRN Zappas, Barbie Haggis, MD   12.5 mg at 09/20/22 1048    heparin (porcine) 5,000 unit/mL injection 5,000 Units  5,000 Units Subcutaneous [facility] Norlene Duel, MD   5,000 Units at 09/18/22 2134    hydrALAZINE (APRESOLINE) injection 10 mg  10 mg Intravenous Q6H PRN Norlene Duel, MD        hydrocortisone 1 % cream   Topical BID Mathis Dad, MD   Given at 09/20/22 0946    hydrOXYzine (ATARAX) tablet 25 mg  25 mg Oral Q8H Ermelinda Das, Aidin, MD   25 mg at 09/20/22 6063    lactated Ringers infusion  10 mL/hr Intravenous Continuous Norlene Duel, MD   Held at 09/08/22 0240    lidocaine 4 % patch 1 patch  1 patch Transdermal Daily Norlene Duel, MD   1 patch at 09/20/22 0939    minoxidil (LONITEN) tablet 5 mg  5 mg Oral BID Mathis Dad, MD   5 mg at 09/20/22 0939    mucositis mixture (with lidocaine)  10 mL Mouth QID Mathis Dad, MD   10 mL at 09/18/22 1251    nystatin (MYCOSTATIN) oral suspension  10 mL Oral [facility] Norlene Duel, MD   1,000,000 Units at 09/11/22 0504    ondansetron (ZOFRAN) injection 4 mg  4 mg Intravenous Q8H PRN Norlene Duel, MD   4 mg at 09/09/22 0250    oxyCODONE (ROXICODONE) immediate release tablet 5 mg  5 mg Oral Q4H PRN Ermelinda Das, Aidin, MD        Or    oxyCODONE (ROXICODONE) immediate release tablet 10 mg  10 mg Oral Q4H PRN Ermelinda Das, Aidin, MD   10 mg at 09/20/22 0307    pantoprazole (Protonix) EC tablet 40 mg  40 mg Oral BID Mathis Dad, MD   40 mg at 09/20/22 0939    predniSONE (DELTASONE) tablet 5  mg  5 mg Oral Daily Norlene Duel, MD   5 mg at 09/20/22 0946    sucralfate (CARAFATE) oral suspension  1 g Oral BID Edrick Kins, MD   1 g at 09/16/22 2122    tacrolimus (PROGRAF) 11 mg combo product  11 mg Oral BID Mathis Dad, MD   11 mg at 09/20/22 1610    valGANciclovir (VALCYTE) tablet 450 mg  450 mg Oral Daily Zappas, Barbie Haggis, MD   450 mg at 09/20/22 0940        Physical Exam:   Vitals:    09/20/22 0808   BP: 142/84   Pulse: 81   Resp: 18   Temp: 36.9 ??C (98.4 ??F)   SpO2: 100%     No intake/output data recorded.    Intake/Output Summary (Last 24 hours) at 09/20/2022 1318  Last data filed at 09/19/2022 2130  Gross per 24 hour   Intake 10 ml   Output 0 ml   Net 10 ml     General: well-appearing, NAD  HEENT: conjunctival anicteric  CV: RRR nl s1/s2 no s3/s4 no m/r, no JVD, no LE edema  Lungs: nl WOB, CTAB w/o adventitious sounds  Abd: incision with bandage on right lower quadrant  Skin: dry  Psych: alert, engaged, appropriate mood and affect  Neuro: normal speech, no gross focal deficits   HD access: Left upper extremity AVF    LABS/IMAGING: Lab and imaging results from last 24 hrs noted and reviewed in EMR.

## 2022-09-21 DIAGNOSIS — Z940 Kidney transplant status: Principal | ICD-10-CM

## 2022-09-21 LAB — CBC
HEMATOCRIT: 29.9 % — ABNORMAL LOW (ref 39.0–48.0)
HEMOGLOBIN: 9.7 g/dL — ABNORMAL LOW (ref 12.9–16.5)
MEAN CORPUSCULAR HEMOGLOBIN CONC: 32.5 g/dL (ref 32.0–36.0)
MEAN CORPUSCULAR HEMOGLOBIN: 30.5 pg (ref 25.9–32.4)
MEAN CORPUSCULAR VOLUME: 93.7 fL (ref 77.6–95.7)
MEAN PLATELET VOLUME: 8 fL (ref 6.8–10.7)
PLATELET COUNT: 175 10*9/L (ref 150–450)
RED BLOOD CELL COUNT: 3.19 10*12/L — ABNORMAL LOW (ref 4.26–5.60)
RED CELL DISTRIBUTION WIDTH: 18.6 % — ABNORMAL HIGH (ref 12.2–15.2)
WBC ADJUSTED: 6.4 10*9/L (ref 3.6–11.2)

## 2022-09-21 LAB — BASIC METABOLIC PANEL
ANION GAP: 3 mmol/L — ABNORMAL LOW (ref 5–14)
BLOOD UREA NITROGEN: 15 mg/dL (ref 9–23)
BUN / CREAT RATIO: 7
CALCIUM: 11.3 mg/dL — ABNORMAL HIGH (ref 8.7–10.4)
CHLORIDE: 113 mmol/L — ABNORMAL HIGH (ref 98–107)
CO2: 21 mmol/L (ref 20.0–31.0)
CREATININE: 2.29 mg/dL — ABNORMAL HIGH
EGFR CKD-EPI (2021) MALE: 36 mL/min/{1.73_m2} — ABNORMAL LOW (ref >=60)
GLUCOSE RANDOM: 91 mg/dL (ref 70–179)
POTASSIUM: 5.5 mmol/L — ABNORMAL HIGH (ref 3.4–4.8)
SODIUM: 137 mmol/L (ref 135–145)

## 2022-09-21 LAB — IONIZED CALCIUM VENOUS: CALCIUM IONIZED VENOUS (MG/DL): 7.07 mg/dL (ref 4.40–5.40)

## 2022-09-21 LAB — MAGNESIUM: MAGNESIUM: 1.7 mg/dL (ref 1.6–2.6)

## 2022-09-21 LAB — PHOSPHORUS: PHOSPHORUS: 2.5 mg/dL (ref 2.4–5.1)

## 2022-09-21 LAB — TACROLIMUS LEVEL, TROUGH: TACROLIMUS, TROUGH: 9.6 ng/mL (ref 5.0–15.0)

## 2022-09-21 MED ORDER — BICTEGRAVIR 50 MG-EMTRICITABINE 200 MG-TENOFOVIR ALAFENAM 25 MG TABLET
ORAL_TABLET | Freq: Every morning | ORAL | 11 refills | 30 days | Status: CP
Start: 2022-09-21 — End: ?
  Filled 2022-09-21: qty 30, 30d supply, fill #0

## 2022-09-21 MED ORDER — DIPHENHYDRAMINE 25 MG CAPSULE
ORAL_CAPSULE | Freq: Four times a day (QID) | ORAL | 0 refills | 25 days | Status: CP | PRN
Start: 2022-09-21 — End: ?
  Filled 2022-09-21: qty 100, 25d supply, fill #0

## 2022-09-21 MED ORDER — DAPSONE 100 MG TABLET
ORAL_TABLET | Freq: Every day | ORAL | 11 refills | 30 days | Status: CP
Start: 2022-09-21 — End: ?
  Filled 2022-09-21: qty 30, 30d supply, fill #0

## 2022-09-21 MED ORDER — DOCUSATE SODIUM 100 MG CAPSULE
ORAL_CAPSULE | Freq: Two times a day (BID) | ORAL | 11 refills | 30 days | Status: CP | PRN
Start: 2022-09-21 — End: 2022-10-21
  Filled 2022-09-21: qty 60, 30d supply, fill #0

## 2022-09-21 MED ORDER — AZATHIOPRINE 50 MG TABLET
ORAL_TABLET | Freq: Every day | ORAL | 11 refills | 30 days | Status: CP
Start: 2022-09-21 — End: ?
  Filled 2022-09-21: qty 90, 30d supply, fill #0

## 2022-09-21 MED ORDER — ASPIRIN 81 MG CHEWABLE TABLET
ORAL_TABLET | Freq: Every day | ORAL | 11 refills | 30 days | Status: CP
Start: 2022-09-21 — End: 2022-09-21

## 2022-09-21 MED ORDER — OXYCODONE 5 MG TABLET
ORAL_TABLET | ORAL | 0 refills | 2 days | Status: CP | PRN
Start: 2022-09-21 — End: 2022-09-26
  Filled 2022-09-21: qty 10, 2d supply, fill #0

## 2022-09-21 MED ORDER — SODIUM BICARBONATE 650 MG TABLET
ORAL_TABLET | Freq: Two times a day (BID) | ORAL | 2 refills | 30 days | Status: CP
Start: 2022-09-21 — End: ?
  Filled 2022-09-21: qty 100, 50d supply, fill #0

## 2022-09-21 MED ORDER — ONDANSETRON 4 MG DISINTEGRATING TABLET
ORAL_TABLET | Freq: Three times a day (TID) | 1 refills | 10 days | Status: CP | PRN
Start: 2022-09-21 — End: ?
  Filled 2022-09-21: qty 30, 10d supply, fill #0

## 2022-09-21 MED ORDER — ACETAMINOPHEN 160 MG/5 ML ORAL LIQUID
Freq: Four times a day (QID) | ORAL | 0 refills | 2 days | Status: CP | PRN
Start: 2022-09-21 — End: ?
  Filled 2022-09-21: qty 118, 2d supply, fill #0

## 2022-09-21 MED ORDER — MINOXIDIL 2.5 MG TABLET
ORAL_TABLET | Freq: Two times a day (BID) | ORAL | 11 refills | 15 days | Status: CP
Start: 2022-09-21 — End: ?
  Filled 2022-09-21: qty 60, 15d supply, fill #0

## 2022-09-21 MED ORDER — PANTOPRAZOLE 40 MG TABLET,DELAYED RELEASE
ORAL_TABLET | Freq: Two times a day (BID) | ORAL | 11 refills | 30 days | Status: CP
Start: 2022-09-21 — End: ?
  Filled 2022-09-21: qty 60, 30d supply, fill #0

## 2022-09-21 MED ORDER — TACROLIMUS 1 MG CAPSULE, IMMEDIATE-RELEASE
ORAL_CAPSULE | Freq: Two times a day (BID) | ORAL | 11 refills | 30.00000 days | Status: CP
Start: 2022-09-21 — End: 2022-09-21
  Filled 2022-09-21: qty 60, 30d supply, fill #0
  Filled 2022-09-21: qty 120, 30d supply, fill #0

## 2022-09-21 MED ORDER — MG-PLUS-PROTEIN 133 MG TABLET
ORAL_TABLET | Freq: Two times a day (BID) | ORAL | 11 refills | 30 days | Status: CP
Start: 2022-09-21 — End: ?
  Filled 2022-09-21: qty 100, 50d supply, fill #0

## 2022-09-21 MED ORDER — HYDROCORTISONE 1 % TOPICAL CREAM
Freq: Two times a day (BID) | TOPICAL | 11 refills | 0 days | Status: CP
Start: 2022-09-21 — End: 2023-09-21
  Filled 2022-09-21: qty 30, 10d supply, fill #0

## 2022-09-21 MED ORDER — TACROLIMUS 5 MG CAPSULE, IMMEDIATE-RELEASE
ORAL_CAPSULE | Freq: Two times a day (BID) | ORAL | 11 refills | 30 days | Status: CP
Start: 2022-09-21 — End: ?

## 2022-09-21 MED ORDER — PREDNISONE 5 MG TABLET
ORAL_TABLET | Freq: Every day | ORAL | 11 refills | 30 days | Status: CP
Start: 2022-09-21 — End: ?
  Filled 2022-09-21: qty 30, 30d supply, fill #0

## 2022-09-21 MED ORDER — CALAMINE 8 %-ZINC OXIDE 8 % LOTION
Freq: Four times a day (QID) | TOPICAL | 0 refills | 0 days | Status: CP
Start: 2022-09-21 — End: ?

## 2022-09-21 MED ORDER — CARVEDILOL 25 MG TABLET
ORAL_TABLET | Freq: Two times a day (BID) | ORAL | 11 refills | 30 days | Status: CP
Start: 2022-09-21 — End: ?
  Filled 2022-09-21: qty 120, 30d supply, fill #0

## 2022-09-21 MED ORDER — AZATHIOPRINE 75 MG TABLET
ORAL_TABLET | Freq: Every day | ORAL | 11 refills | 30 days
Start: 2022-09-21 — End: ?

## 2022-09-21 MED ORDER — ALBUTEROL SULFATE HFA 90 MCG/ACTUATION AEROSOL INHALER
Freq: Four times a day (QID) | RESPIRATORY_TRACT | 11 refills | 0 days | Status: CP | PRN
Start: 2022-09-21 — End: ?
  Filled 2022-09-21: qty 18, 17d supply, fill #0

## 2022-09-21 MED ADMIN — azathioprine (IMURAN) tablet 150 mg: 150 mg | ORAL | @ 12:00:00 | Stop: 2022-09-21 | NDC 72789012930

## 2022-09-21 MED ADMIN — carvedilol (COREG) tablet 50 mg: 50 mg | ORAL | @ 02:00:00 | NDC 82009012805

## 2022-09-21 MED ADMIN — hydrOXYzine (ATARAX) tablet 25 mg: 25 mg | ORAL | @ 12:00:00 | Stop: 2022-09-21 | NDC 00641637515

## 2022-09-21 MED ADMIN — oxyCODONE (ROXICODONE) immediate release tablet 10 mg: 10 mg | ORAL | @ 09:00:00 | Stop: 2022-09-21 | NDC 71335142204

## 2022-09-21 MED ADMIN — tacrolimus (PROGRAF) 11 mg combo product: 11 mg | ORAL | @ 09:00:00 | Stop: 2022-09-21 | NDC 00469301601

## 2022-09-21 MED ADMIN — hydrocortisone 1 % cream: TOPICAL | @ 12:00:00 | Stop: 2022-09-21 | NDC 71863011050

## 2022-09-21 MED ADMIN — minoxidil (LONITEN) tablet 5 mg: 5 mg | ORAL | @ 02:00:00 | NDC 72789027901

## 2022-09-21 MED ADMIN — calamine-zinc oxide 8-8 % lotion: TOPICAL | @ 02:00:00

## 2022-09-21 MED ADMIN — predniSONE (DELTASONE) tablet 5 mg: 5 mg | ORAL | @ 12:00:00 | Stop: 2022-09-21 | NDC 55289085902

## 2022-09-21 MED ADMIN — diphenhydrAMINE (BENADRYL) capsule/tablet 25 mg: 25 mg | ORAL | @ 17:00:00 | Stop: 2022-09-21 | NDC 97807007030

## 2022-09-21 MED ADMIN — pantoprazole (Protonix) EC tablet 40 mg: 40 mg | ORAL | @ 02:00:00 | NDC 82009001190

## 2022-09-21 MED ADMIN — lidocaine 4 % patch 1 patch: 1 | TRANSDERMAL | @ 12:00:00 | Stop: 2022-09-21 | NDC 54868389400

## 2022-09-21 MED ADMIN — minoxidil (LONITEN) tablet 5 mg: 5 mg | ORAL | @ 12:00:00 | Stop: 2022-09-21 | NDC 72789027901

## 2022-09-21 MED ADMIN — oxyCODONE (ROXICODONE) immediate release tablet 10 mg: 10 mg | ORAL | @ 02:00:00 | Stop: 2022-09-24 | NDC 71335142204

## 2022-09-21 MED ADMIN — diphenhydrAMINE (BENADRYL) capsule/tablet 25 mg: 25 mg | ORAL | @ 02:00:00 | NDC 97807007030

## 2022-09-21 MED ADMIN — carvedilol (COREG) tablet 50 mg: 50 mg | ORAL | @ 12:00:00 | Stop: 2022-09-21 | NDC 82009012805

## 2022-09-21 MED ADMIN — diphenhydrAMINE (BENADRYL) capsule/tablet 25 mg: 25 mg | ORAL | @ 09:00:00 | Stop: 2022-09-21 | NDC 97807007030

## 2022-09-21 MED ADMIN — bictegrav-emtricit-tenofov ala (BIKTARVY) 50-200-25 mg tablet 1 tablet: 1 | ORAL | @ 12:00:00 | Stop: 2022-09-21 | NDC 61958250103

## 2022-09-21 MED ADMIN — valGANciclovir (VALCYTE) tablet 450 mg: 450 mg | ORAL | @ 12:00:00 | Stop: 2022-09-21 | NDC 69097027703

## 2022-09-21 MED ADMIN — pantoprazole (Protonix) EC tablet 40 mg: 40 mg | ORAL | @ 12:00:00 | Stop: 2022-09-21 | NDC 82009001190

## 2022-09-21 MED ADMIN — diphenhydrAMINE (BENADRYL) injection: 12.5 mg | INTRAVENOUS | @ 06:00:00 | Stop: 2022-09-21 | NDC 57866728801

## 2022-09-21 MED ADMIN — hydrOXYzine (ATARAX) tablet 25 mg: 25 mg | ORAL | @ 04:00:00 | Stop: 2022-09-21 | NDC 00641637515

## 2022-09-21 MED ADMIN — sodium bicarbonate tablet 650 mg: 650 mg | ORAL | @ 15:00:00 | Stop: 2022-09-21 | NDC 77333084425

## 2022-09-21 MED ADMIN — sodium chloride 0.9% (NS) bolus 1,000 mL: 1000 mL | INTRAVENOUS | @ 02:00:00 | Stop: 2022-09-21 | NDC 53191040901

## 2022-09-21 NOTE — Unmapped (Signed)
[name]&O X 4, on RA, VSS and afebrile.  Pt up ad lib without issue.  C/O pain and itching controlled with prn's.  NAD noted.    Problem: Wound  Goal: Optimal Coping  Outcome: Ongoing - Unchanged  Goal: Optimal Functional Ability  Outcome: Ongoing - Unchanged  Intervention: Optimize Functional Ability  Recent Flowsheet Documentation  Taken 09/20/2022 2000 by Amilya Haver, [place] A, RN  Activity Management: up ad lib  Goal: Absence of Infection Signs and Symptoms  Outcome: Ongoing - Unchanged  Intervention: Prevent or Manage Infection  Recent Flowsheet Documentation  Taken 09/20/2022 2000 by Zakarie Sturdivant, [place] A, RN  Infection Management: aseptic technique maintained  Goal: Improved Oral Intake  Outcome: Ongoing - Unchanged  Goal: Optimal Pain Control and Function  Outcome: Ongoing - Unchanged  Goal: Skin Health and Integrity  Outcome: Ongoing - Unchanged  Intervention: Optimize Skin Protection  Recent Flowsheet Documentation  Taken 09/20/2022 2000 by Luria Rosario, [place] A, RN  Activity Management: up ad lib  Pressure Reduction Techniques: frequent weight shift encouraged  Head of Bed (HOB) Positioning: HOB at 30-45 degrees  Goal: Optimal Wound Healing  Outcome: Ongoing - Unchanged     Problem: Adult Inpatient Plan of Care  Goal: Plan of Care Review  Outcome: Ongoing - Unchanged  Goal: Patient-Specific Goal (Individualized)  Outcome: Ongoing - Unchanged  Goal: Absence of Hospital-Acquired Illness or Injury  Outcome: Ongoing - Unchanged  Intervention: Identify and Manage Fall Risk  Recent Flowsheet Documentation  Taken 09/20/2022 2000 by Catherina Pates, [place] A, RN  Safety Interventions:   environmental modification   fall reduction program maintained   lighting adjusted for tasks/safety   low bed  Intervention: Prevent Skin Injury  Recent Flowsheet Documentation  Taken 09/20/2022 2200 by Emannuel Vise, [place] A, RN  Positioning for Skin: Supine/Back  Taken 09/20/2022 2000 by Keeva Reisen, [place] A, RN  Positioning for Skin: Supine/Back  Intervention: Prevent and Manage VTE (Venous Thromboembolism) Risk  Recent Flowsheet Documentation  Taken 09/21/2022 0200 by Larry Alcock, [place] A, RN  Anti-Embolism Device Type: SCD, Knee  Anti-Embolism Intervention: Refused  Anti-Embolism Device Location: BLE  Taken 09/21/2022 0000 by Bronx Brogden, [place] A, RN  Anti-Embolism Device Type: SCD, Knee  Anti-Embolism Intervention: Refused  Anti-Embolism Device Location: BLE  Taken 09/20/2022 2200 by Hurshel Bouillon, [place] A, RN  Anti-Embolism Device Type: SCD, Knee  Anti-Embolism Intervention: Refused  Anti-Embolism Device Location: BLE  Taken 09/20/2022 2000 by Virgina Deakins, [place] A, RN  Anti-Embolism Device Type: SCD, Knee  Anti-Embolism Intervention: Refused  Anti-Embolism Device Location: BLE  Intervention: Prevent Infection  Recent Flowsheet Documentation  Taken 09/20/2022 2000 by Conception Doebler, [place] A, RN  Infection Prevention:   personal protective equipment utilized   hand hygiene promoted   rest/sleep promoted   equipment surfaces disinfected   environmental surveillance performed   cohorting utilized   single patient room provided   visitors restricted/screened  Goal: Optimal Comfort and Wellbeing  Outcome: Ongoing - Unchanged  Goal: Readiness for Transition of Care  Outcome: Ongoing - Unchanged  Goal: Rounds/Family Conference  Outcome: Ongoing - Unchanged     Problem: Fall Injury Risk  Goal: Absence of Fall and Fall-Related Injury  Outcome: Ongoing - Unchanged  Intervention: Promote Scientist, clinical (histocompatibility and immunogenetics) Documentation  Taken 09/20/2022 2000 by Lemmie Vanlanen, [place] A, RN  Safety Interventions:   environmental modification   fall reduction program maintained   lighting adjusted for tasks/safety   low bed     Problem: Infection  Goal: Absence of Infection Signs and Symptoms  Outcome:  Ongoing - Unchanged  Intervention: Prevent or Manage Infection  Recent Flowsheet Documentation  Taken 09/20/2022 2000 by Leaf Kernodle, [place] A, RN  Infection Management: aseptic technique maintained     Problem: Self-Care Deficit  Goal: Improved Ability to Complete Activities of Daily Living  Outcome: Ongoing - Unchanged

## 2022-09-21 NOTE — Unmapped (Signed)
Discharge Summary    Admit date: 09/05/2022    Discharge date and time: 09/21/22    Discharge to:  Home    Discharge Service: Surg Transplant [facility])    Discharge Attending Physician: No att. providers found    Discharge  Diagnoses: Renal allotransplantation    Secondary Diagnosis: Active Problems:    Kidney replaced by transplant (POA: Not Applicable)  Resolved Problems:    * No resolved hospital problems. *      OR Procedures:    RENAL ALLOTRANSPLANTATION, IMPLANTATION OF GRAFT; WITHOUT RECIPIENT NEPHRECTOMY  BACKBENCH RECONSTRUCTION CADAVER/LIVING DONOR RENAL ALLOGRAFT PRIOR TO TRANSPLANT; ARTERIAL ANASTOMOSIS EAC  Date  09/05/2022  -------------------    UGI ENDOSCOPY; WITH BIOPSY, SINGLE OR MULTIPLE  Date  09/12/2022  -------------------     Ancillary Procedures: EGD    Discharge Day Services:     Subjective   No acute events overnight. Pain Controlled. No fever or chills. Heartburn improving and patient able to have appropriate PO intake.     Objective   Patient Vitals for the past 8 hrs:   BP Temp Temp src Pulse Resp SpO2 Weight   09/21/22 1200 107/82 36.8 ??C (98.2 ??F) Oral 81 -- 99 % 73.3 kg (161 lb 9.6 oz)   09/21/22 0800 131/82 36.7 ??C (98.1 ??F) Oral 86 16 100 % --   09/21/22 0700 -- -- -- 88 -- -- --     No intake/output data recorded.    General Appearance:   No acute distress  Lungs:                Clear to auscultation bilaterally  Heart:                           Regular rate and rhythm  Abdomen:                Soft, non-tender, non-distended  Extremities:              Warm and well perfused    Hospital Course:  The patient was taken to the OR on 09/05/22 for kidney transplantation. He tolerated the procedure well, was extubated in the OR, and was taken to the PACU where He received routine postoperative care. He was then transferred to the Surgical Stepdown unit for close observation and cardiorespiratory monitoring. The allograft was evaluated with ultrasonography and found to have patent vasculature and mildly elevated resistive indices. He received routine IV fluid replacement based on urine output.     On POD2 patient had significant reaction to pentamidine given for ppx, thought to be iso respiratory irritation and needing multiple doses of albuterol. On POD3 he was transferred to the SICU due to uncontrolled BP over 200 and patient unwilling to take any BP medications and was placed on nicardipine drip. He was clinically stable, off nicardipine drip and was transferred to the floor on POD6. Patient then had significant hearburn and on EGD 7/16 was found to have gastritis and esophagitis, thought to be 2/2 medications iso negative infcetious workup. Medications were adjusted to alleviate effects on his upper Gi tract. He did well thereafter and diet was slowly advanced. At the time of discharge patient was tolerating a regular diet, had pain controlled with PO pain medication, and able to return to the preoperative ambulatory status. Anti-rejection medication levels were monitored and dosages adjusted to maintain a therapeutic regimen with the input of Pharmacy. The patient was seen and assessed by  Physical Therapy and deemed suitable for discharge. He will be discharged home on POD#16 in stable condition without home healthcare.    JPs:  None at discharge  Incision: staples CDI x 4-6 weeks post op  UOP: 5x unmeasured  -Passed TOV on 7/15  Graft Function: s/p DDKT on 09/05/22 with Dr. Norma Fredrickson, KDPI 18, CIT 0, cPRA 7%  -post op course c/b DGF? No  Immunosuppression:  -Induction: Thymoglobulin   -Tac at dc: 11mg  BID  -Aza at dc: 150mg  daily  -Pred at dc: 5mg  daily  Prophylaxis/ID:  -PJP: Allergic to bactrim and had reaction to pentamidine, needs plan  -CMV: CMV (D-/R+ or D+/R+): Moderate Risk, 450mg  Valcyte daily x 3 months, renally dose adjusted  -ASA 81 at dc: No     Outpatient referral:  [ ]  Surgical oncology for continued elevated Ca iso parathyroidectomy           Condition at Discharge: Improved  Discharge Medications:      Medication List      START taking these medications     ALLERGY RELIEF(DIPHENHYDRAMIN) 25 mg capsule; Generic drug:   diphenhydrAMINE; Take 1 capsule (25 mg total) by mouth every six (6) hours   as needed for itching.   azathioprine 50 mg tablet; Commonly known as: IMURAN; Take 3 tablets   (150 mg total) by mouth daily.   calamine-zinc oxide 8-8 % Lotn; Apply topically four (4) times a day.   Children's acetaminophen 160 mg/5 mL solution; Generic drug:   acetaminophen; Take 20.3 mL (650 mg total) by mouth every six (6) hours as   needed for fever or pain.; Replaces: acetaminophen 500 MG tablet   dapsone 100 MG tablet; Take 1 tablet (100 mg total) by mouth daily.   START taking on 10/05/22   docusate sodium 100 MG capsule; Commonly known as: COLACE; Take 1   capsule (100 mg total) by mouth two (2) times a day as needed for   constipation.   hydrocortisone 1 % cream; Apply topically two (2) times a day.   magnesium (amino acid chelate) 133 mg tablet; Generic drug: magnesium   oxide-Mg AA chelate; Take 1 tablet by mouth two (2) times a day. Hold   until directed to take by nurse coordinator   ondansetron 4 MG disintegrating tablet; Commonly known as: ZOFRAN-ODT;   Dissolve 1 tablet (4 mg total) in the mouth every eight (8) hours as   needed for nausea.   oxyCODONE 5 MG immediate release tablet; Commonly known as: ROXICODONE;   Take 1 tablet (5 mg total) by mouth every four (4) hours as needed for   pain for up to 5 days.   predniSONE 5 MG tablet; Commonly known as: DELTASONE; Take 1 tablet (5   mg total) by mouth in the morning.   sodium bicarbonate 650 mg tablet; Take 1 tablet (650 mg total) by mouth   two (2) times a day.   * tacrolimus 5 MG capsule; Commonly known as: PROGRAF; Take 2 capsules   (10 mg total) by mouth two (2) times a day. Take with one, 1 mg capsule,   twice a day for total of 11 mg twice daily   * tacrolimus 1 MG capsule; Commonly known as: PROGRAF; Take 1 capsule (1   mg total) by mouth two (2) times a day. Take with 5 mg capsules   valGANciclovir 450 mg tablet; Commonly known as: VALCYTE; Take 1 tablet   (450 mg total) by mouth daily.  *  This list has 2 medication(s) that are the same as other medications   prescribed for you. Read the directions carefully, and ask your doctor or   other care provider to review them with you.     CHANGE how you take these medications     carvedilol 25 MG tablet; Commonly known as: COREG; Take 2 tablets (50 mg   total) by mouth two (2) times a day.; What changed: how much to take   minoxidil 2.5 MG tablet; Commonly known as: LONITEN; Take 2 tablets (5   mg total) by mouth two (2) times a day.; What changed: how much to take,   when to take this   VENTOLIN HFA 90 mcg/actuation inhaler; Generic drug: albuterol; Inhale   1-2 puffs every four (4) to six (6) hours as needed for wheezing or   shortness of breath.; What changed: how much to take, reasons to take this     CONTINUE taking these medications     BIKTARVY 50-200-25 mg tablet; Generic drug: bictegrav-emtricit-tenofov   ala; Take 1 tablet by mouth every morning.   naloxone 4 mg/actuation nasal spray; Commonly known as: NARCAN   pantoprazole 40 MG tablet; Commonly known as: Protonix; Take 1 tablet   (40 mg total) by mouth two (2) times a day.     STOP taking these medications     acetaminophen 500 MG tablet; Commonly known as: TYLENOL; Replaced by:   Children's acetaminophen 160 mg/5 mL solution   aspirin 81 MG chewable tablet   calcium carbonate 200 mg calcium (500 mg) chewable tablet; Commonly   known as: TUMS   cholecalciferol (vitamin D3-50 mcg (2,000 unit)) 50 mcg (2,000 unit)   tablet   diclofenac sodium 1 % gel; Commonly known as: VOLTAREN   ergocalciferol-1,250 mcg (50,000 unit) 1,250 mcg (50,000 unit) capsule;   Commonly known as: DRISDOL   HYDROcodone-acetaminophen 5-325 mg per tablet; Commonly known as: NORCO   ipratropium-albuterol 0.5-2.5 mg/3 mL nebulizer; Commonly known as:   DUO-NEB valsartan 160 MG tablet; Commonly known as: DIOVAN       Pending Test Results:     Discharge Instructions:    Labs and Other Follow-ups after Discharge:  Follow Up instructions and Outpatient Referrals     Ambulatory Referral to Surgical Oncology          Future Appointments:  Appointments which have been scheduled for you      Sep 22, 2022 9:30 AM  (Arrive by 9:20 AM)  RETURN HCP TELEPHONE with Eliezer Bottom, LCSW  [facility] KIDNEY TRANSPLANT [facility] REGION) [address]  [place] [address]  [phone]        Sep 29, 2022 12:00 PM  (Arrive by 11:45 AM)  LAB ONLY with [facility] ONLY  [facility] [place] [facility] REGION) [address]  [facility] 1 through 4  [place] [address]        Sep 29, 2022 2:00 PM  (Arrive by 1:45 PM)  RETURN PHARMD with KIDNEY TRANSPLANT PHARMACY  [facility] KIDNEY TRANSPLANT [place] [facility] REGION) [address]  [facility] 1 through 4  [place] [address]  [phone]        Sep 29, 2022 2:30 PM  (Arrive by 2:15 PM)  RETURN NEPHROLOGY POST with Leafy Half, MD  [facility] KIDNEY TRANSPLANT [place]) [address]  [facility] 1 through 4  [place]  [address]  [phone]        Oct 05, 2022 9:00 AM  (Arrive by 8:30 AM)  LAB ONLY with LAB [facility]  LAB [facility] REGION) [address]  [place] [address]  [phone]        Oct 05, 2022 11:30 AM  (Arrive by 11:00 AM)  RETURN PHARMD with TRANSPLANT SURGERY PHARMACY  [facility] TRANSPLANT SURGERY [place] [facility] REGION) [address]  [place] [address]  [phone]        Oct 05, 2022 1:00 PM  (Arrive by 12:30 PM)  RETURN 15 with Gemma Payor, MD  [facility] TRANSPLANT SURGERY [facility] REGION) [address]  [place] [address]  [phone]        Oct 06, 2022 12:30 PM  (Arrive by 12:15 PM)  HOSPITAL FOLLOW UP with Pamalee Leyden, MD  [facility] GI MEDICINE [place] [facility] REGION) [address]  [facility] 1 through 4  [place] [address]  [phone]        Oct 10, 2022 11:15 AM  (Arrive by 10:45 AM)  CYSTO STENT REMOVAL with [facility] PROCEDURES  [facility] UROLOGY PROCEDURES [place] [facility] REGION) [address]  [place] [address]  [phone]        Nov 02, 2022 12:30 PM  (Arrive by 12:15 PM)  RETURN ICID/TRANSPLANT with Audley Hose, MD  [facility] INFECTIOUS DISEASES [place] [facility] REGION) [address]  [facility] 1 through 4  [place] [address]  [phone]        Nov 30, 2022 10:00 AM  (Arrive by 9:45 AM)  RETURN ICID/TRANSPLANT with Audley Hose, MD  [facility] INFECTIOUS DISEASES [place]  [facility] REGION) [address]  [facility] 1 through 4  [place] [address]  [phone]

## 2022-09-21 NOTE — Unmapped (Signed)
Transplant Nephrology Follow-Up Consult note      Assessment/Recommendations: Mr.Mizael Allee is a 42 y.o. 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   - Continue azathioprine 150 mg daily.  - Continue tacrolimus (trough target 8-10)    # Hyperkalemia  - Persistently elevated potassium, unlikely to tolerate lokelma given GI symptoms  - Recommend sodium bicarbonate 650 mg bid     # Hypercalcemia  # Secondary hyperparathyroidism s/p 3-gland parathyroidectomy in 2021  - PTH elevated to 2000-3000 with calcium now around 11 after transplant (previously normal)  - Previously evaluated by endocrinology who did not feel the patient would benefit from further parathyroidectomy, but that was prior to transplant. Had recommended cinacalcet for hypercalemia post-transplant however has a documented allergy to cinacalcet. Confirmed that it was hives and allergic reaction that almost required him to go to the ED. He should have a follow up with endocrinology to discuss options.    # Esophagitis   - EGD 09/12/2022: EGD showed severe esophagitis in the middle and distal third of the esophagus. Biopsies were taken for histology and viral PCR. Diffuse severe inflammation was also seen in the stomach which was biopsied. A 10mm non-bleeding linear gastric ulcer was also seen. Pathology with acute ulcerative esophagitis with foreign material and acute erosive gastropathy, concern for pill esophagitis    - BID PPI and liquid carafate  - mycophenolate stopped on 7/22, and transitioned to imuran  - still symptomatic, but improved from prior    # Normocytic anemia  - Hgb stable at around 10    # Infectious Prophylaxis and Monitoring:   - HIV - biktarvy  - Nystatin (thrush ppx), valgangciclovir (CMV ppx), famotidine    # Blood Pressure / Volume:  - Controlled on carvedilol 50 bid and minoxidil 5 mg bid  - Hydralazine discontinued due to itching     Transplant info  Recipient Labs: cPRA 7%, CMV positive, EBV positive, HCV Ab negative/ RNA negative, HBsAg negative, cAb negative/sAb positive, HIV positive, RPR negative, HSV-1 positive, HSV-2 positive, VZV IgG positive, Rubella IgG positive, Toxo IgG negative     DONOR INFORMATION:   ABO: O  HLA: A: 2, 26  B: 8, 62  DR: 17, 11  KDPI: 19  Cr: initial 0.95, peak: 1.92, terminal: 1.38  Status: DCD    **Transplant patients with an open wound require wound care with sterile water only. The patient should be counseled on this at the time of discharge if they have not already been doing this.**     Patient was seen and discussed with attending nephrologist Dr. Margaretmary Bayley. Recommendations were discussed with the primary team. Nephrology will continue to follow.    Thank you for this consult.   Please page the renal fellow on call with  questions/concerns.    Worthy Keeler, DO  Division of Nephrology and Hypertension  [facility]  09/21/2022    ___________________________________________________________    Subjective/Interval History:   - continues to have burning and pain in the epigastric region when eating, but is able to intake fluids okay    Medications:   Current Facility-Administered Medications   Medication Dose Route Frequency Provider Last Rate Last Admin acetaminophen (TYLENOL) oral liquid  1,000 mg Oral Q8H Kapoor, Kunal, MD        albuterol 2.5 mg /3 mL (0.083 %) nebulizer solution 2.5 mg  2.5 mg Nebulization Q6H PRN Norlene Duel, MD   2.5 mg at 09/15/22 1240    [START ON 10/05/2022] atovaquone (MEPRON) oral suspension  1,500 mg Oral Daily Mathis Dad, MD        azathioprine (IMURAN) tablet 150 mg  150 mg Oral Daily Mathis Dad, MD   150 mg at 09/21/22 0820    bictegrav-emtricit-tenofov ala (BIKTARVY) 50-200-25 mg tablet 1 tablet  1 tablet Oral Daily Norlene Duel, MD   1 tablet at 09/21/22 0820    bisacodyl (DULCOLAX) suppository 10 mg  10 mg Rectal Daily PRN Norlene Duel, MD   10 mg at 09/11/22 2103    bismuth subsalicylate (PEPTO-BISMOL) oral suspension  30 mL Oral Q6H PRN Norlene Duel, MD   30 mL at 09/09/22 2136    calamine-zinc oxide 8-8 % lotion   Topical QID Norlene Duel, MD   Given at 09/20/22 2203    carvedilol (COREG) tablet 50 mg  50 mg Oral BID Trevor Iha, DO   50 mg at 09/21/22 0820    diphenhydrAMINE (BENADRYL) capsule/tablet 25 mg  25 mg Oral Q6H PRN Ermelinda Das, Aidin, MD   25 mg at 09/21/22 1239    heparin (porcine) 5,000 unit/mL injection 5,000 Units  5,000 Units Subcutaneous [facility] Norlene Duel, MD   5,000 Units at 09/18/22 2134    hydrALAZINE (APRESOLINE) injection 10 mg  10 mg Intravenous Q6H PRN Norlene Duel, MD        hydrocortisone 1 % cream   Topical BID Mathis Dad, MD   Given at 09/21/22 0826    hydrOXYzine (ATARAX) tablet 25 mg  25 mg Oral Q8H Ermelinda Das, Aidin, MD   25 mg at 09/21/22 0820    lactated Ringers infusion  10 mL/hr Intravenous Continuous Norlene Duel, MD   Held at 09/08/22 0240    lidocaine 4 % patch 1 patch  1 patch Transdermal Daily Norlene Duel, MD   1 patch at 09/21/22 0826    minoxidil (LONITEN) tablet 5 mg  5 mg Oral BID Mathis Dad, MD   5 mg at 09/21/22 0820    mucositis mixture (with lidocaine)  10 mL Mouth QID Mathis Dad, MD 10 mL at 09/18/22 1251    nystatin (MYCOSTATIN) oral suspension  10 mL Oral [facility] Norlene Duel, MD   1,000,000 Units at 09/11/22 0504    ondansetron (ZOFRAN) injection 4 mg  4 mg Intravenous Q8H PRN Norlene Duel, MD   4 mg at 09/09/22 0250    oxyCODONE (ROXICODONE) immediate release tablet 5 mg  5 mg Oral Q4H PRN Ermelinda Das, Aidin, MD        Or    oxyCODONE (ROXICODONE) immediate release tablet 10 mg  10 mg Oral Q4H PRN Ermelinda Das, Aidin, MD   10 mg at 09/21/22 0529    pantoprazole (Protonix) EC tablet 40 mg  40 mg Oral  BID Mathis Dad, MD   40 mg at 09/21/22 0820    predniSONE (DELTASONE) tablet 5 mg  5 mg Oral Daily Norlene Duel, MD   5 mg at 09/21/22 0820    sodium bicarbonate tablet 650 mg  650 mg Oral BID Mathis Dad, MD   650 mg at 09/21/22 1121    sucralfate (CARAFATE) oral suspension  1 g Oral BID Edrick Kins, MD   1 g at 09/16/22 2122    tacrolimus (PROGRAF) 11 mg combo product  11 mg Oral BID Mathis Dad, MD   11 mg at 09/21/22 1610    valGANciclovir (VALCYTE) tablet 450 mg  450 mg Oral Daily Zappas, Barbie Haggis, MD   450 mg at 09/21/22 0820     Current Outpatient Medications   Medication Sig Dispense Refill    naloxone (NARCAN) 4 mg nasal spray SPRAY TO 1 NOSTRIL IN CASE OF OPIOID OVERDOSE, MAY REPEAT IN 2-3 MINUTES UNTIL EMS ARRIVES OR RESPONSIVE      acetaminophen (TYLENOL) 160 mg/5 mL solution Take 20.3 mL (650 mg total) by mouth every six (6) hours as needed for fever or pain. 118 mL 0    albuterol HFA 90 mcg/actuation inhaler Inhale 1-2 puffs every four (4) to six (6) hours as needed for wheezing or shortness of breath. 18 g 11    azathioprine (IMURAN) 50 mg tablet Take 3 tablets (150 mg total) by mouth daily. 90 tablet 11    bictegrav-emtricit-tenofov ala (BIKTARVY) 50-200-25 mg tablet Take 1 tablet by mouth every morning. 30 tablet 11    calamine-zinc oxide 8-8 % Lotn Apply topically four (4) times a day. 177 mL 0    carvedilol (COREG) 25 MG tablet Take 2 tablets (50 mg total) by mouth two (2) times a day. 120 tablet 11    dapsone 100 MG tablet Take 1 tablet (100 mg total) by mouth daily. START taking on 10/05/22 30 tablet 11    diphenhydrAMINE (BENADRYL) 25 mg capsule Take 1 capsule (25 mg total) by mouth every six (6) hours as needed for itching. 100 capsule 0    docusate sodium (COLACE) 100 MG capsule Take 1 capsule (100 mg total) by mouth two (2) times a day as needed for constipation. 60 capsule 11    hydrocortisone 1 % cream Apply topically two (2) times a day. 30 g 11    magnesium oxide-Mg AA chelate (MAGNESIUM, AMINO ACID CHELATE,) 133 mg Take 1 tablet by mouth two (2) times a day. Hold until directed to take by nurse coordinator 60 tablet 11    minoxidil (LONITEN) 2.5 MG tablet Take 2 tablets (5 mg total) by mouth two (2) times a day. 60 tablet 11    ondansetron (ZOFRAN-ODT) 4 MG disintegrating tablet Dissolve 1 tablet (4 mg total) in the mouth every eight (8) hours as needed for nausea. 30 tablet 1    oxyCODONE (ROXICODONE) 5 MG immediate release tablet Take 1 tablet (5 mg total) by mouth every four (4) hours as needed for pain for up to 5 days. 10 tablet 0    pantoprazole (PROTONIX) 40 MG tablet Take 1 tablet (40 mg total) by mouth two (2) times a day. 60 tablet 11    predniSONE (DELTASONE) 5 MG tablet Take 1 tablet (5 mg total) by mouth in the morning. 30 tablet 11    sodium bicarbonate 650 mg tablet Take 1 tablet (650 mg total) by mouth two (2) times a day. 60 tablet 2  tacrolimus (PROGRAF) 1 MG capsule Take 1 capsule (1 mg total) by mouth two (2) times a day. Take with 5 mg capsules 60 capsule 11    tacrolimus (PROGRAF) 5 MG capsule Take 2 capsules (10 mg total) by mouth two (2) times a day. Take with one, 1 mg capsule, twice a day for total of 11 mg twice daily 120 capsule 11    valGANciclovir (VALCYTE) 450 mg tablet Take 1 tablet (450 mg total) by mouth daily. 30 tablet 2        Physical Exam:   Vitals:    09/21/22 1200   BP: 107/82   Pulse: 81 Resp:    Temp: 36.8 ??C (98.2 ??F)   SpO2: 99%     No intake/output data recorded.  No intake or output data in the 24 hours ending 09/21/22 1512    General: well-appearing, NAD  HEENT: conjunctival anicteric  CV: RRR nl s1/s2 no s3/s4 no m/r, no JVD, no LE edema  Lungs: nl WOB, CTAB w/o adventitious sounds  Abd: incision with bandage on right lower quadrant  Skin: dry  Psych: alert, engaged, appropriate mood and affect  Neuro: normal speech, no gross focal deficits   HD access: Left upper extremity AVF    LABS/IMAGING: Lab and imaging results from last 24 hrs noted and reviewed in EMR.

## 2022-09-22 ENCOUNTER — Ambulatory Visit: Admit: 2022-09-22 | Discharge: 2022-09-23 | Payer: MEDICARE

## 2022-09-22 DIAGNOSIS — Z940 Kidney transplant status: Principal | ICD-10-CM

## 2022-09-22 DIAGNOSIS — B259 Cytomegaloviral disease, unspecified: Principal | ICD-10-CM

## 2022-09-22 LAB — CBC W/ DIFFERENTIAL
BANDED NEUTROPHILS ABSOLUTE COUNT: 0 10*3/uL (ref 0.0–0.1)
BASOPHILS ABSOLUTE COUNT: 0.1 10*3/uL (ref 0.0–0.2)
BASOPHILS RELATIVE PERCENT: 1 %
EOSINOPHILS ABSOLUTE COUNT: 0.1 10*3/uL (ref 0.0–0.4)
EOSINOPHILS RELATIVE PERCENT: 1 %
HEMATOCRIT: 32.1 % — ABNORMAL LOW (ref 37.5–51.0)
HEMOGLOBIN: 10.3 g/dL — ABNORMAL LOW (ref 13.0–17.7)
IMMATURE GRANULOCYTES: 0 %
LYMPHOCYTES ABSOLUTE COUNT: 0.4 10*3/uL — ABNORMAL LOW (ref 0.7–3.1)
LYMPHOCYTES RELATIVE PERCENT: 5 %
MEAN CORPUSCULAR HEMOGLOBIN CONC: 32.1 g/dL (ref 31.5–35.7)
MEAN CORPUSCULAR HEMOGLOBIN: 29.9 pg (ref 26.6–33.0)
MEAN CORPUSCULAR VOLUME: 93 fL (ref 79–97)
MONOCYTES ABSOLUTE COUNT: 0.3 10*3/uL (ref 0.1–0.9)
MONOCYTES RELATIVE PERCENT: 5 %
NEUTROPHILS ABSOLUTE COUNT: 6.1 10*3/uL (ref 1.4–7.0)
NEUTROPHILS RELATIVE PERCENT: 88 %
PLATELET COUNT: 186 10*3/uL (ref 150–450)
RED BLOOD CELL COUNT: 3.45 x10E6/uL — ABNORMAL LOW (ref 4.14–5.80)
RED CELL DISTRIBUTION WIDTH: 15.3 % (ref 11.6–15.4)
WHITE BLOOD CELL COUNT: 7 10*3/uL (ref 3.4–10.8)

## 2022-09-22 MED ORDER — VALGANCICLOVIR 450 MG TABLET
ORAL_TABLET | Freq: Every day | ORAL | 2 refills | 30 days | Status: CP
Start: 2022-09-22 — End: 2022-12-22
  Filled 2022-09-22: qty 30, 30d supply, fill #0

## 2022-09-22 NOTE — Unmapped (Unsigned)
The patient reports they are physically located in [place] and is currently: at home. I conducted a phone visit.  I spent 30 minutes on the phone call with the patient on the date of service .       **THIS PATIENT WAS NOT SEEN IN PERSON TO MINIMIZE POTENTIAL SPREAD OF COVID-19, PROTECT PATIENTS/PROVIDERS, AND REDUCE PPE UTILIZATION.**    PATIENT NAME: Christian Brown     MR#: [id number]    DOB: 12/26/1980    [facility]  CONFIDENTIAL SOCIAL WORK   KIDNEY POST TRANSPLANT FOLLOW UP      DATE OF EVALUATION: 09/22/2022    INFORMANTS: Patient/Christian Brown; w/ mom/ Christian Brown    PREFERRED LANGUAGE: English     INTERPRETER UTILIZED: N/A    TXP CARE TEAM:   Post Transplant RN CoordinatorCelene Squibb [phone]  Primary Transplant Provider: Montine Circle Procopio, Velora Heckler, [facility] True, Leafy Half, Erin M Fisher, Thomasene Mohair, Animesh Joellyn Haff, Dianna Limbo I Ruud    REFERRAL INFORMATION:    Mr.  Christian Brown is a 42 y.o. African-American male is s/p transplant for kidney transplantation . CSW follows up to assess recovery since DC.    TRANSPLANT DATE:   09/05/2022 (Kidney)    MOST RECENT HOSPITAL ADMISSION (@ [place]):   Previous admit date: 09/05/2022 to 09/21/22    FUTURE APPOINTMENTS (@ ):   Future Appointments   Date Time Provider Department Center   09/29/2022 12:00 PM [facility] LAB ONLY [facility]   09/29/2022  2:00 PM KIDNEY TRANSPLANT PHARMACY [facility]   09/29/2022  2:30 PM Kotzen, Francine Graven, MD [facility]   10/05/2022  9:00 AM LAB PHLEB GRND [facility]   10/05/2022 11:30 AM TRANSPLANT SURGERY PHARMACY [facility]   10/05/2022  1:00 PM Gemma Payor, MD [facility]   10/06/2022 12:30 PM Lupu, Delmar Landau, MD [facility]   10/10/2022 11:15 AM [facility] PROCEDURES UROPROCUM[facility] 11/02/2022 12:30 PM Arant, Dorise Hiss, MD [facility]   11/30/2022 10:00 AM Arant, Dorise Hiss, MD [facility]       HOME HEALTH/DME NEEDS AT LAST DC:   HH: N/A   Services: N/A   Contact: n/a  DME: N/A  Other: N/A  Other Remaining:   Ureter stent: Yes  Staples: Yes  Surgical drains x0: No  PD Cath: N/A    LIFESTYLE:  Physical activity:  Good  Nutrition/Appetite:  Good  Sleep: Fair; up and down, lots of bathroom trips    SOCIAL HISTORY:  Citizenship Status: [place] Citizen   Marital Status: single  Lives with: lives with mom part-time (has same address as mom, but she is a traveling Scientist, forensic, a Environmental consultant)  Children/Dependents:  none   Social support: mom, aunt, local friends  Housing: no concerns with housing, reports housing stability      CAREGIVING PLAN:  Location:  at home w/ mother  Support/Caregivers:  mother  Household tasks/errands:  mother  Medication:  mother  Transportation: mother    COMPLIANCE HISTORY:  Medication Adherence: Good  Medication Concerns: denied problems taking medications, concerns about side effects, affordability, problems obtaining medications, and difficulty remembering medications  Other Adherence: Good     Side Effects: none    INSURANCE:  [employer] assistance: N/A  Payer/Plan Subscriber Name Rel Member # Group #  MEDICARE - MEDICARE P* Zeimet,Jaishaun Self [id number]       [address]   MEDICAID [place] - MEDICAIKORDE, HOPKIN Self [id number]       [address] [address]     Dialysis Start Date:  02/11/2015     SSDI eligibility: 2/2 ESRD per patient report    ESRD Medicare eligibility: 09/05/22; 08/28/22-08/26/25    65th BD: 01/22/2048    30 mon COB period: N/A; expired    Access to other insurance plans?: unknown    INCOME:   Current income sources: SSDI 2/2 ESRD; $1800  Monthly expenses: rent, utilities, car payment x1 (reliable x1), medication co-pays ($5/month), other customary bills  Financial Risks and Debts: hospital bills   Current income meets basic needs: yes, but managing with limited resources, shares expenses with mom     Highest completed grade level: some college   Currently employed: none   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, music History: not discussed today     ATTITUDE ABOUT TRANSPLANT:  Expectations: was not prepared for gastritis after txp; was not prepared for itching  Fears/Concerns: denies  What would you change?:  denies  Rec'd Info on Ltr to Donor Family: yes    MENTAL HEALTH HISTORY: reflective of current   Current issues/mood: denies  Medications: denies  Therapy: denies  SI/HI: denies    SUBSTANCE HISTORY: reflective of current   Tobacco: denies  Alcohol: denies  Illicit Substances: denies  OTC/Supplements: denies    PAIN HISTORY: reflective of current  Current : some pain still  Current use of pain medication/pain control: took oxycotin today before getting in car to ride to labs    SUMMARY:  Spoke w/ pt and mother via phone today at appt time.  No specific questions/concerns at this time.  Pt reports feeling well overall.        Anticipatory guidance/education provided on the following:  --Possibility of readmissions, unplanned clinic visits, extra lab work  --Possibility of mood changes, including when to call providers  --Gradual decline in frequency of clinic visits and lab work over time  --Importance of communication with txp team  --Caution to talk w/ txp team before taking any other prescriptions or OTC medications (incl vitamins, supplements. herbal teas)  --Additional infection control precautions during acute recovery phase (and ongoing)  --Additional physical & lifting restrictions (including driving) during acute recovery phase  --Normalization of complexity of care, frequently changing medications, and other details to keep organized  --Normalization of need for support/assistance post transplant for ALL patients  --Normalization of frequent urge/need to urinate, including possibility of incontinent accidents   --Encouragement to drink fluids as directed; normalization of difficulty at times with sudden increase in recommendations  --Verified pt/family has correct contact information for this CSW        RECOMMENDATIONS:   1. Review ESRD benefits s/p transplant  2. F/up 2-3 weeks        Lowella Petties, LCSW, CCTSW  Transplant Case Manager/Social Worker  [facility] for Transplant Care  Completed: 09/22/22 benefits            ***, LCSW, CCTSW  Transplant Case Manager/Social [facility] for Transplant Care  Completed: 09/22/22

## 2022-09-22 NOTE — Unmapped (Signed)
[facility] Specialty Medication Onboarding    Specialty Medication: Tacrolimus 1mg capsule  Prior Authorization: Not Required   Financial Assistance: No - copay  <$25  Final Copay/Day Supply: $0 / 30 days    Insurance Restrictions: Yes - max 1 month supply     Notes to Pharmacist: N/A  Credit Card on File: no    The triage team has completed the benefits investigation and has determined that the patient is able to fill this medication at [facility]. Please contact the patient to complete the onboarding or follow up with the prescribing physician as needed.

## 2022-09-22 NOTE — Unmapped (Signed)
Call to patient today to check on status since discharge. The following questions were asked:        Do you have all of your medications? no, missing Valcyte - script sent to [facility] pharmacy for delivery to Caregiver location - [facility] [address] [place], [place] [address] [address]. Per PharmD Christena Deem - moderate risk CMV - ok to miss doses until shipment arrives.     Do you have any new swelling? no     Is your swelling the same?...worse?[name]...or better? than when you left the hospital?  nothing new     Are you eating and drinking Ok? How much water are you drinking? yes - so far today. Encouraged at least 2-3L/day     Any nausea, vomiting or diarrhea? No. Passing gas, no BM today. Taking stool softener     Are you passing your urine OK? yes     Is your pain controlled? yes     Are you taking your pain medication? yes     Did you go home with your bladder (foley) catheter? no     Or JP drain? no     Any issues with your wound?.....drainage?.....have you looked at your incision? clean, dry, intact     Any other questions or concerns? No      Confirmed labs were drawn at [facility] today.

## 2022-09-22 NOTE — Unmapped (Signed)
[facility]   Patient Onboarding/Medication Counseling    Christian Brown is a 42 y.o. male with a kidney transplant who I am counseling today on continuation of therapy.  I am speaking to the patient.    Was a Nurse, learning disability used for this call? No    Verified patient's date of birth / HIPAA.    Specialty medication(s) to be sent: Transplant: valgancyclovir 450mg       Non-specialty medications/supplies to be sent: none      Medications not needed at this time: none.         Valcyte (valganciclovir)    Medication & Administration     Dosage:   Take one tablet daily.    Administration:   Take with food  Swallow the pills whole, do not break, crush, or chew    Adherence/Missed dose instructions:  Take a missed dose as soon as you think about it with food  If it is close to your next dose, skip the missed dose and go back to your normal time.  Do not take 2 doses at the same time or extra doses.  Report any missed doses to coordinator    Goals of Therapy     To prevent or treat CMV infection in setting of solid organ transplant    Side Effects & Monitoring Parameters   Common side effects  Headache  Diarrhea or constipation  Appetite or sleep disturbances  Back, muscle, joint, or belly pain  Weight loss  Dizziness  Muscle spasm  Upset stomach or vomiting    The following side effects should be reported to the provider:  Allergic reaction  (rash, hives, swelling, blistered or peeling skin, shortness of breath)  Infection (fever, chills, sore throat, ear/sinus pain, cough, sputum change, urinary pain, mouth sores, non-healing wounds)  Bleeding (cough ground vomit, blood in urine, black/red/tarry stools, unexplained bruising or bleeding)  Electrolyte problems (mood changes, confusion, weakness, abnormal heartbeat, seizures)  Kidney problems (urine changes, weight gain)  Yellowing skin or eyes  Swelling in arms, legs, stomach  Severe dizziness or passing out  Eye issues (eyesight changes, pain, or irritation)  Night sweats    Monitoring parameters  Have eye exam as directed by doctor  CMV counts  CBC  Renal function  Pregnancy test prior to initiation    Contraindications, Warnings, & Precautions   BBW: severe leukopenia, neutropenia, anemia, thrombocytopenia, pancytopenia, and bone marrow failure, including aplastic anemia have been reported  BBW: may cause temporary or permanent inhibition of spermatogenesis and suppression of fertilty; has the potential to cause birth defects and cancers in humans  Male patients should have pregnancy test prior to initiation and use birth control for at least 30 days after discontinuation  Male patients should use a barrier contraceptive while on therapy and for 90 days after discontinuation  Acute renal failure  Not indicated for use in liver transplant recipients  Breastfeeding is not recommended    Drug/Food Interactions   Medication list reviewed in Epic. The patient was instructed to inform the care team before taking any new medications or supplements. No drug interactions identified.   Check with your doctor before getting any vaccinations (live or inactivated)    Storage, Handling Precautions, & Disposal   Store at room temperature  Keep away from children and pets      Current Medications (including OTC/herbals), Comorbidities and Allergies     Current Outpatient Medications   Medication Sig Dispense Refill    acetaminophen (TYLENOL)  160 mg/5 mL solution Take 20.3 mL (650 mg total) by mouth every six (6) hours as needed for fever or pain. 118 mL 0    albuterol HFA 90 mcg/actuation inhaler Inhale 1-2 puffs every four (4) to six (6) hours as needed for wheezing or shortness of breath. 18 g 11    azathioprine (IMURAN) 50 mg tablet Take 3 tablets (150 mg total) by mouth daily. 90 tablet 11    bictegrav-emtricit-tenofov ala (BIKTARVY) 50-200-25 mg tablet Take 1 tablet by mouth every morning. 30 tablet 11    calamine-zinc oxide 8-8 % Lotn Apply topically four (4) times a day. 177 mL 0    carvedilol (COREG) 25 MG tablet Take 2 tablets (50 mg total) by mouth two (2) times a day. 120 tablet 11    dapsone 100 MG tablet Take 1 tablet (100 mg total) by mouth daily. START taking on 10/05/22 30 tablet 11    diphenhydrAMINE (BENADRYL) 25 mg capsule Take 1 capsule (25 mg total) by mouth every six (6) hours as needed for itching. 100 capsule 0    docusate sodium (COLACE) 100 MG capsule Take 1 capsule (100 mg total) by mouth two (2) times a day as needed for constipation. 60 capsule 11    hydrocortisone 1 % cream Apply topically two (2) times a day. 30 g 11    magnesium oxide-Mg AA chelate (MAGNESIUM, AMINO ACID CHELATE,) 133 mg Take 1 tablet by mouth two (2) times a day. Hold until directed to take by nurse coordinator 60 tablet 11    minoxidil (LONITEN) 2.5 MG tablet Take 2 tablets (5 mg total) by mouth two (2) times a day. 60 tablet 11    naloxone (NARCAN) 4 mg nasal spray SPRAY TO 1 NOSTRIL IN CASE OF OPIOID OVERDOSE, MAY REPEAT IN 2-3 MINUTES UNTIL EMS ARRIVES OR RESPONSIVE      ondansetron (ZOFRAN-ODT) 4 MG disintegrating tablet Dissolve 1 tablet (4 mg total) in the mouth every eight (8) hours as needed for nausea. 30 tablet 1    oxyCODONE (ROXICODONE) 5 MG immediate release tablet Take 1 tablet (5 mg total) by mouth every four (4) hours as needed for pain for up to 5 days. 10 tablet 0    pantoprazole (PROTONIX) 40 MG tablet Take 1 tablet (40 mg total) by mouth two (2) times a day. 60 tablet 11    predniSONE (DELTASONE) 5 MG tablet Take 1 tablet (5 mg total) by mouth in the morning. 30 tablet 11    sodium bicarbonate 650 mg tablet Take 1 tablet (650 mg total) by mouth two (2) times a day. 60 tablet 2    tacrolimus (PROGRAF) 1 MG capsule Take 3 capsules (3 mg total) by mouth two (2) times a day. Take with 5 mg capsules (for dose adjustments) 180 capsule 11    tacrolimus (PROGRAF) 5 MG capsule Take 2 capsules (10 mg total) by mouth two (2) times a day. Take with one, 1 mg capsule, twice a day for total of 11 mg twice daily 120 capsule 11    valGANciclovir (VALCYTE) 450 mg tablet Take 1 tablet (450 mg total) by mouth daily. 30 tablet 2     No current facility-administered medications for this visit.       Allergies   Allergen Reactions    Amlodipine Hives, Palpitations, Rash and Other (See Comments)     Causes fatigue      Doxercalciferol Hives    Fish Containing Products Hives and Shortness Of  Breath     Please do not serve fish for meals. Highly allergic         Iron Sucrose Hives    Labetalol Itching and Rash    Metoclopramide Hives and Other (See Comments)     Causes ticks/ jerks    Metoprolol Hives    Nifedipine Hives, Itching and Shortness Of Breath    Other Swelling     Suture material, localized swelling at closure site    Sulfamethoxazole-Trimethoprim Rash and Other (See Comments)     Renal failure      Vancomycin Hives and Other (See Comments)     Patient states vancomycin caused kidney injury    Calcitonin     Peanut Oil Other (See Comments)    Potassium Clavulanate     Clindamycin Other (See Comments) and Palpitations     Chest pain    Hydralazine Itching and Dizziness           Sensipar [Cinacalcet] Hives       Patient Active Problem List   Diagnosis    Asthma    CMV (cytomegalovirus infection) (CMS-HCC)    Essential hypertension    HIV infection (CMS-HCC)    OSA (obstructive sleep apnea)    Other specified disorders of parathyroid gland (CMS-HCC)    Multiple drug allergies    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)    Dialysis AV fistula malfunction (CMS-HCC)    Acute blood loss anemia    Melena    Abdominal pain    Nephropathy associated with AIDS (CMS-HCC)    Anemia of chronic disease    GERD (gastroesophageal reflux disease)    Chest pressure    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    Appropriateness of Therapy     Acute infections noted within Epic:  No active infections  Patient reported infection: None    Is medication and dose appropriate based on diagnosis and infection status? Yes    Prescription has been clinically reviewed: Yes      Baseline Quality of Life Assessment      How many days over the past month did your kidney transplant  keep you from your normal activities? For example, brushing your teeth or getting up in the morning. 0    Financial Information     Medication Assistance provided: None Required    Anticipated copay of $0 reviewed with patient. Verified delivery address.    Delivery Information     Scheduled delivery date: 09/25/22    Expected start date: 09/25/22      Medication will be delivered via UPS to the temporary address in [facility].  This shipment will not require a signature.      Explained the services we provide at [facility] and that each month we would call  to set up refills.  Stressed importance of returning phone calls so that we could ensure they receive their medications in time each month.  Informed patient that we should be setting up refills 7-10 days prior to when they will run out of medication.  A pharmacist will reach out to perform a clinical assessment periodically.  Informed patient that a welcome packet, containing information about our pharmacy and other support services, a Notice of Privacy Practices, and a drug information handout will be sent.      The patient or caregiver noted above participated in the development of this care plan and knows that they can request review of or adjustments to the care plan at any time.      Patient or caregiver verbalized understanding of the above information as well as how to contact the pharmacy at [phone] option 4 with any questions/concerns.  The pharmacy is open Monday through Friday 8:30am-4:30pm.  A pharmacist is available 24/7 via pager to answer any clinical questions they may have.    Patient Specific Needs     Does the patient have any physical, cognitive, or cultural barriers? No    Does the patient have adequate living arrangements? (i.e. the ability to store and take their medication appropriately) Yes    Did you identify any home environmental safety or security hazards? No    Patient prefers to have medications discussed with  Patient     Is the patient or caregiver able to read and understand education materials at a high school level or above? Yes    Patient's primary language is  English     Is the patient high risk? Yes, patient is taking a REMS drug. Medication is dispensed in compliance with REMS program    SOCIAL DETERMINANTS OF HEALTH     At the [facility], we have learned that life circumstances - like trouble affording food, housing, utilities, or transportation can affect the health of many of our patients.   That is why we wanted to ask: are you currently experiencing any life circumstances that are negatively impacting your health and/or quality of life? Patient declined to answer    Social Determinants of Health     Financial Resource Strain: Low Risk  (09/06/2022)    Overall Financial Resource Strain (CARDIA)     Difficulty of Paying Living Expenses: Not hard at all   Internet Connectivity: Not on file   Food Insecurity: No Food Insecurity (09/06/2022)    Hunger Vital Sign     Worried About Running Out of Food in the Last Year: Never true     Ran Out of Food in the Last Year: Never true   Tobacco Use: Low Risk  (09/06/2022)    Patient History     Smoking Tobacco Use: Never Smokeless Tobacco Use: Never     Passive Exposure: Not on file   Housing/Utilities: Low Risk  (09/06/2022)    Housing/Utilities     Within the past 12 months, have you ever stayed: outside, in a car, in a tent, in an overnight shelter, or temporarily in someone else's home (i.e. couch-surfing)?: No     Are you worried about losing your housing?: No     Within the past 12 months, have you been unable to get utilities (heat, electricity) when it was really needed?: No   Alcohol Use: Not At Risk (09/05/2018)    Received from [facility] [place], [facility]    AUDIT-C  Frequency of Alcohol Consumption: Never     Average Number of Drinks: Not on file     Frequency of Binge Drinking: Not on file   Transportation Needs: No Transportation Needs (09/06/2022)    PRAPARE - Transportation     Lack of Transportation (Medical): No     Lack of Transportation (Non-Medical): No   Substance Use: Not on file   Health Literacy: Not on file   Physical Activity: Not on file   Interpersonal Safety: Unknown (09/22/2022)    Interpersonal Safety     Unsafe Where You Currently Live: Not on file     Physically Hurt by Anyone: Not on file     Abused by Anyone: Not on file   Stress: Not on file   Intimate Partner Violence: Patient Declined (03/05/2022)    Received from [facility] [place], [facility] [place], Afraid, Rape, and Kick questionnaire     Fear of Current or Ex-Partner: Patient declined     Emotionally Abused: Patient declined     Physically Abused: Patient declined     Sexually Abused: Patient declined   Depression: Not at risk (11/08/2021)    Received from [facility], [facility]    PHQ-2     Depression Risk: 0   Social Connections: Unknown (07/07/2021)    Received from [facility]    Social Network     Social Network: Not on file       Would you be willing to receive help with any of the needs that you have identified today? Not applicable Tera Helper, [facility]  [facility] Specialty Pharmacist

## 2022-09-22 NOTE — Unmapped (Signed)
[facility] Specialty Medication Onboarding    Specialty Medication: valGANciclovir 450 mg tablet (VALCYTE)  Prior Authorization: Not Required   Financial Assistance: No - copay  <$25  Final Copay/Day Supply: $0 / 30    Insurance Restrictions: None     Notes to Pharmacist:   Credit Card on File: not applicable    The triage team has completed the benefits investigation and has determined that the patient is able to fill this medication at [facility]. Please contact the patient to complete the onboarding or follow up with the prescribing physician as needed.

## 2022-09-23 LAB — RENAL FUNCTION PANEL
ALBUMIN: 3.5 g/dL — ABNORMAL LOW (ref 4.1–5.1)
BLOOD UREA NITROGEN: 15 mg/dL (ref 6–20)
BUN / CREAT RATIO: 7 — ABNORMAL LOW (ref 9–20)
CALCIUM: 11.3 mg/dL — ABNORMAL HIGH (ref 8.7–10.2)
CHLORIDE: 111 mmol/L — ABNORMAL HIGH (ref 96–106)
CO2: 18 mmol/L — ABNORMAL LOW (ref 20–29)
CREATININE: 2.08 mg/dL — ABNORMAL HIGH (ref 0.76–1.27)
EGFR: 41 mL/min/{1.73_m2} — ABNORMAL LOW
GLUCOSE: 96 mg/dL (ref 70–99)
PHOSPHORUS, SERUM: 2.5 mg/dL — ABNORMAL LOW (ref 2.8–4.1)
POTASSIUM: 5.6 mmol/L — ABNORMAL HIGH (ref 3.5–5.2)
SODIUM: 138 mmol/L (ref 134–144)

## 2022-09-23 LAB — MAGNESIUM: MAGNESIUM: 1.5 mg/dL — ABNORMAL LOW (ref 1.6–2.3)

## 2022-09-25 DIAGNOSIS — Z940 Kidney transplant status: Principal | ICD-10-CM

## 2022-09-25 LAB — TACROLIMUS LEVEL: TACROLIMUS BLOOD: 8.4 ng/mL (ref 2.0–20.0)

## 2022-09-26 DIAGNOSIS — Z79899 Other long term (current) drug therapy: Principal | ICD-10-CM

## 2022-09-26 DIAGNOSIS — N186 End stage renal disease: Principal | ICD-10-CM

## 2022-09-26 DIAGNOSIS — Z940 Kidney transplant status: Principal | ICD-10-CM

## 2022-09-26 LAB — MICROSCOPIC EXAMINATION
BACTERIA: NONE SEEN
CASTS: NONE SEEN /LPF
EPITHELIAL CELLS (NON RENAL): NONE SEEN /HPF (ref 0–10)
RBC URINE: NONE SEEN /HPF (ref 0–2)

## 2022-09-26 LAB — URINALYSIS WITH MICROSCOPY
BILIRUBIN UA: NEGATIVE
GLUCOSE UA: NEGATIVE
KETONES UA: NEGATIVE
LEUKOCYTE ESTERASE UA: NEGATIVE
NITRITE UA: NEGATIVE
PH UA: 6.5 (ref 5.0–7.5)
SPECIFIC GRAVITY UA: 1.018 (ref 1.005–1.030)
UROBILINOGEN UA: 0.2 mg/dL (ref 0.2–1.0)

## 2022-09-26 LAB — CBC W/ DIFFERENTIAL
BANDED NEUTROPHILS ABSOLUTE COUNT: 0 10*3/uL (ref 0.0–0.1)
BASOPHILS ABSOLUTE COUNT: 0.1 10*3/uL (ref 0.0–0.2)
BASOPHILS RELATIVE PERCENT: 1 %
EOSINOPHILS ABSOLUTE COUNT: 0.1 10*3/uL (ref 0.0–0.4)
EOSINOPHILS RELATIVE PERCENT: 1 %
HEMATOCRIT: 33.2 % — ABNORMAL LOW (ref 37.5–51.0)
HEMOGLOBIN: 10.5 g/dL — ABNORMAL LOW (ref 13.0–17.7)
IMMATURE GRANULOCYTES: 0 %
LYMPHOCYTES ABSOLUTE COUNT: 0.4 10*3/uL — ABNORMAL LOW (ref 0.7–3.1)
LYMPHOCYTES RELATIVE PERCENT: 7 %
MEAN CORPUSCULAR HEMOGLOBIN CONC: 31.6 g/dL (ref 31.5–35.7)
MEAN CORPUSCULAR HEMOGLOBIN: 29.2 pg (ref 26.6–33.0)
MEAN CORPUSCULAR VOLUME: 92 fL (ref 79–97)
MONOCYTES ABSOLUTE COUNT: 0.2 10*3/uL (ref 0.1–0.9)
MONOCYTES RELATIVE PERCENT: 3 %
NEUTROPHILS ABSOLUTE COUNT: 5.7 10*3/uL (ref 1.4–7.0)
NEUTROPHILS RELATIVE PERCENT: 88 %
PLATELET COUNT: 166 10*3/uL (ref 150–450)
RED BLOOD CELL COUNT: 3.6 x10E6/uL — ABNORMAL LOW (ref 4.14–5.80)
RED CELL DISTRIBUTION WIDTH: 15 % (ref 11.6–15.4)
WHITE BLOOD CELL COUNT: 6.5 10*3/uL (ref 3.4–10.8)

## 2022-09-26 LAB — BASIC METABOLIC PANEL
BLOOD UREA NITROGEN: 18 mg/dL (ref 6–20)
BUN / CREAT RATIO: 10 (ref 9–20)
CALCIUM: 11.8 mg/dL — ABNORMAL HIGH (ref 8.7–10.2)
CHLORIDE: 110 mmol/L — ABNORMAL HIGH (ref 96–106)
CO2: 17 mmol/L — ABNORMAL LOW (ref 20–29)
CREATININE: 1.78 mg/dL — ABNORMAL HIGH (ref 0.76–1.27)
EGFR: 49 mL/min/{1.73_m2} — ABNORMAL LOW
GLUCOSE: 90 mg/dL (ref 70–99)
POTASSIUM: 5.7 mmol/L — ABNORMAL HIGH (ref 3.5–5.2)
SODIUM: 137 mmol/L (ref 134–144)

## 2022-09-26 LAB — SPECIMEN STATUS REPORT

## 2022-09-27 ENCOUNTER — Ambulatory Visit
Admit: 2022-09-27 | Discharge: 2022-09-29 | Disposition: A | Discharge status: Home / Self Care | Payer: MEDICARE | Admitting: Surgery

## 2022-09-27 LAB — CBC W/ AUTO DIFF
BASOPHILS ABSOLUTE COUNT: 0.1 10*9/L (ref 0.0–0.1)
BASOPHILS RELATIVE PERCENT: 1.2 %
EOSINOPHILS ABSOLUTE COUNT: 0 10*9/L (ref 0.0–0.5)
EOSINOPHILS RELATIVE PERCENT: 0.4 %
HEMATOCRIT: 33 % — ABNORMAL LOW (ref 39.0–48.0)
HEMOGLOBIN: 10.7 g/dL — ABNORMAL LOW (ref 12.9–16.5)
LYMPHOCYTES ABSOLUTE COUNT: 0.4 10*9/L — ABNORMAL LOW (ref 1.1–3.6)
LYMPHOCYTES RELATIVE PERCENT: 7 %
MEAN CORPUSCULAR HEMOGLOBIN CONC: 32.5 g/dL (ref 32.0–36.0)
MEAN CORPUSCULAR HEMOGLOBIN: 30.3 pg (ref 25.9–32.4)
MEAN CORPUSCULAR VOLUME: 93.4 fL (ref 77.6–95.7)
MEAN PLATELET VOLUME: 8.1 fL (ref 6.8–10.7)
MONOCYTES ABSOLUTE COUNT: 0.3 10*9/L (ref 0.3–0.8)
MONOCYTES RELATIVE PERCENT: 4.4 %
NEUTROPHILS ABSOLUTE COUNT: 5.2 10*9/L (ref 1.8–7.8)
NEUTROPHILS RELATIVE PERCENT: 87 %
PLATELET COUNT: 161 10*9/L (ref 150–450)
RED BLOOD CELL COUNT: 3.54 10*12/L — ABNORMAL LOW (ref 4.26–5.60)
RED CELL DISTRIBUTION WIDTH: 17.6 % — ABNORMAL HIGH (ref 12.2–15.2)
WBC ADJUSTED: 6 10*9/L (ref 3.6–11.2)

## 2022-09-27 LAB — COMPREHENSIVE METABOLIC PANEL
ALBUMIN: 3.5 g/dL (ref 3.4–5.0)
ALKALINE PHOSPHATASE: 348 U/L — ABNORMAL HIGH (ref 46–116)
ALT (SGPT): <7 U/L — ABNORMAL LOW (ref 10–49)
ANION GAP: 5 mmol/L (ref 5–14)
AST (SGOT): 19 U/L (ref <=34)
BILIRUBIN TOTAL: 0.6 mg/dL (ref 0.3–1.2)
BLOOD UREA NITROGEN: 14 mg/dL (ref 9–23)
BUN / CREAT RATIO: 8
CALCIUM: 11.9 mg/dL — ABNORMAL HIGH (ref 8.7–10.4)
CHLORIDE: 115 mmol/L — ABNORMAL HIGH (ref 98–107)
CO2: 18 mmol/L — ABNORMAL LOW (ref 20.0–31.0)
CREATININE: 1.84 mg/dL — ABNORMAL HIGH
EGFR CKD-EPI (2021) MALE: 47 mL/min/{1.73_m2} — ABNORMAL LOW (ref >=60)
GLUCOSE RANDOM: 99 mg/dL (ref 70–179)
POTASSIUM: 5.9 mmol/L — ABNORMAL HIGH (ref 3.4–4.8)
PROTEIN TOTAL: 7 g/dL (ref 5.7–8.2)
SODIUM: 138 mmol/L (ref 135–145)

## 2022-09-27 LAB — URINALYSIS WITH MICROSCOPY WITH CULTURE REFLEX PERFORMABLE
BACTERIA: NONE SEEN /HPF
BILIRUBIN UA: NEGATIVE
GLUCOSE UA: NEGATIVE
KETONES UA: NEGATIVE
NITRITE UA: NEGATIVE
PH UA: 6.5 (ref 5.0–9.0)
RBC UA: 44 /HPF — ABNORMAL HIGH (ref <=3)
SPECIFIC GRAVITY UA: 1.013 (ref 1.003–1.030)
SQUAMOUS EPITHELIAL: <1 /HPF (ref 0–5)
UROBILINOGEN UA: <2
WBC UA: 6 /HPF — ABNORMAL HIGH (ref <=2)

## 2022-09-27 LAB — TACROLIMUS LEVEL: TACROLIMUS BLOOD: 9.3 ng/mL (ref 2.0–20.0)

## 2022-09-27 LAB — SEDIMENTATION RATE: ERYTHROCYTE SEDIMENTATION RATE: 38 mm/h — ABNORMAL HIGH (ref 0–15)

## 2022-09-27 LAB — C-REACTIVE PROTEIN: C-REACTIVE PROTEIN: 16 mg/L — ABNORMAL HIGH (ref <=10.0)

## 2022-09-27 MED ADMIN — ondansetron (ZOFRAN) injection 4 mg: 4 mg | INTRAVENOUS | @ 12:00:00 | Stop: 2022-09-27 | NDC 71930001752

## 2022-09-27 MED ADMIN — calcium gluconate in sodium chloride (NS) 0.9% 2 gram/100 mL IVPB 2 g: 2 g | INTRAVENOUS | @ 18:00:00 | Stop: 2022-09-27

## 2022-09-27 MED ADMIN — acetaminophen (TYLENOL) tablet 650 mg: 650 mg | ORAL | @ 13:00:00 | Stop: 2022-09-27 | NDC 97807001074

## 2022-09-27 MED ADMIN — dextrose (D10W) 10% bolus 250 mL: 25 g | INTRAVENOUS | @ 18:00:00 | Stop: 2022-09-27 | NDC 00087022944

## 2022-09-27 MED ADMIN — morphine injection 2 mg: 2 mg | INTRAVENOUS | @ 12:00:00 | Stop: 2022-09-27 | NDC 76045000411

## 2022-09-27 MED ADMIN — fentaNYL (PF) (SUBLIMAZE) injection 50 mcg: 50 ug | INTRAVENOUS | @ 18:00:00 | Stop: 2022-09-27 | NDC 68258301001

## 2022-09-27 MED ADMIN — ondansetron (ZOFRAN) injection 4 mg: 4 mg | INTRAVENOUS | @ 21:00:00 | Stop: 2022-09-27 | NDC 71930001752

## 2022-09-27 MED ADMIN — sodium chloride 0.9% (NS) bolus 1,000 mL: 1000 mL | INTRAVENOUS | @ 21:00:00 | Stop: 2022-09-27 | NDC 53191040901

## 2022-09-27 MED ADMIN — fentaNYL (PF) (SUBLIMAZE) injection 50 mcg: 50 ug | INTRAVENOUS | @ 22:00:00 | Stop: 2022-09-27 | NDC 68258301001

## 2022-09-27 MED ADMIN — sodium zirconium cyclosilicate (LOKELMA) packet 10 g: 10 g | ORAL | @ 18:00:00 | Stop: 2022-09-27 | NDC 50090650101

## 2022-09-27 MED ADMIN — insulin regular (HumuLIN,NovoLIN) injection 5 Units: 5 [IU] | INTRAVENOUS | @ 18:00:00 | Stop: 2022-09-27 | NDC 68115072810

## 2022-09-27 NOTE — Unmapped (Signed)
Pt coming in POV for post-op incision pain in RUQ and also emesis.

## 2022-09-27 NOTE — Unmapped (Signed)
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  Denies fever, chills, decreased urinary output, hematuria.  On exam, patient appears in no significant distress, resting comfortably at bedside.  Patient is afebrile temp 37.0, without tachycardia heart rate 87 bpm.  Physical examination shows evidence of well-healing right lower abdominal incision site without overt wound dehiscence.  No induration, fluctuance, mass, swelling, erythema, warmth, tracking noted.  No drainage able to be expressed from incision site at this time.  Otherwise, abdominal examinations benign, without rebound, guarding, rigidity in all 4 quadrants. Laboratory studies show no evidence of leukocytosis, WBC 6.0.  Hemoglobin improved from baseline at 10.7.  Mild hyperkalemia noted at 5.9, creatinine stable 1.84.  CRP mild elevated at 16, ESR 38.  Discussed with transplant surgery team who will evaluate the patient. Patient signed out to the incoming emergency medicine team pending CT imaging, transplant surgery evaluation.    Discussion of Management with other Physicians, QHP or Appropriate Source: Transplant surgery services  Independent Interpretation of Studies: Labs  External Records Reviewed: Patient's most recent discharge summary  Escalation of Care, Consideration of Admission/Observation/Transfer: N/A  Social determinants that significantly affected care: None applicable  Prescription drug(s) considered but not prescribed: N/A  Diagnostic tests considered but not performed: N/A  History obtained from other sources: Family       I have reviewed the vital signs and the nursing notes. Labs and radiology results that were available during my care of the patient were independently reviewed by me and considered in my medical decision making.     I reviewed the patient's prior medical records.  I independently visualized the EKG tracing.  I independently visualized the radiology images.      Disposition:   The patient was signed out to the incoming emergency medicine team in stable condition.       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  Patient reports a 1 day history of seropurulent drainage from right lower abdominal incision site associated with pain, nausea/vomiting.  Otherwise, denies fever, chills, CP, loss consciousness, head chest pain, shortness of breath, hematemesis, melena, hematuria, decreased urinary output, urgency/frequency.    Past Medical/Past Surgical History:   Reviewed in EHR including nursing documentation as outlined. Pertinent PMH/PSH also noted above in HPI.   Past Medical History:   Diagnosis Date    Acquired immunodeficiency syndrome (CMS-HCC) 11/14/2013    Formatting of this note might be different from the original.   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   He appears to be doing well. States he is back to 90% except for his leg swelling.  Will change his epivir to 150 mg daily when he completes his liquid rx. Will see him back in 1 month and repeat his labs th    Acute deep vein thrombosis (DVT) of popliteal vein of left lower extremity (CMS-HCC) 02/05/2019    Last Assessment & Plan:   Formatting of this note might be different from the original.  Moderate effusion left knee new the some limitation of motion.  No evidence of sepsis the white count the normal  will     Proceed on with an aspiration for cell count, culture and crystal analysis.  New x-rays ordered and currently pending will follow.  Further therapeutic recommendations contingent on the re    Anemia     Asthma     Candidal esophagitis (CMS-HCC) 07/30/2022    Community acquired pneumonia 07/30/2014    DVT (deep venous thrombosis) (CMS-HCC) 11/24/2013    Last Assessment & Plan:    Formatting of this note might be different from the original.   Appears to be doing better (swelling improved). He will f/u with PCP regarding coumadin dosing (INR is low).  Formatting of this note might be different from the original.   Last Assessment & Plan:    Formatting of this note might be different from the original.   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   greatly appreciate renal f/u.  Formatting of this note might be different from the original.   Last Assessment & Plan:    greatly appreciate renal f/u.   Last Assessment & Plan:    greatly appreciate renal f/u.  Formatting of this note might be different from the original.   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 Rectal bleeding 04/23/2019    Sleep apnea      Patient Active Problem List   Diagnosis    Asthma    CMV (cytomegalovirus infection) (CMS-HCC)    Essential hypertension    HIV infection (CMS-HCC)    OSA (obstructive sleep apnea)    Other specified disorders of parathyroid gland (CMS-HCC)    Multiple drug allergies    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)    Dialysis AV fistula malfunction (CMS-HCC)    Acute blood loss anemia    Melena    Abdominal pain    Nephropathy associated with AIDS (CMS-HCC)    Anemia of chronic disease    GERD (gastroesophageal reflux disease)    Chest pressure    Chronic deep vein thrombosis (DVT) of femoral vein of left lower extremity (CMS-HCC)    Chronic diarrhea    Dependence on renal dialysis (CMS-HCC)    Left ventricular hypertrophy    Mild intermittent asthma without complication    Mitral valve disorder    Neuropathy involving both lower extremities  Nonrheumatic mitral valve stenosis    Pulmonary hypertension (CMS-HCC)    Renovascular hypertension    Iron deficiency anemia    End stage renal failure on dialysis (CMS-HCC)    History of DVT (deep vein thrombosis)    Kidney replaced by transplant     Past Surgical History:   Procedure Laterality Date    INTUSSUSCEPTION REPAIR      PERICARDIAL WINDOW      PR AUTOTRANSPLANT, PARATHYROID Bilateral 06/17/2019    Procedure: PARATHYROID AUTOTRANSPLANTATION;  Surgeon: Karren Burly, MD;  Location: MAIN OR [facility];  Service: ENT    PR CREAT AV FISTULA,NON-AUTOGENOUS GRAFT Left 07/05/2022    Procedure: CREATE AV FISTULA (SEPARATE PROC); NONAUTOGENOUS GRAFT (EG, BIOLOGICAL COLLAGEN, THERMOPLASTIC GRAFT), UPPER EXTREMITY;  Surgeon: Toledo, Lilyan Punt, MD;  Location: MAIN OR [facility];  Service: Transplant    PR EXCISION TURBINATE,SUBMUCOUS Bilateral 10/22/2020    Procedure: SUBMUCOUS RESECTION INFERIOR TURBINATE, PARTIAL OR COMPLETE, ANY METHOD;  Surgeon: Maeola Sarah, MD;  Location: OR [facility]; Service: ENT    PR EXPLORE PARATHYROID GLANDS Bilateral 06/17/2019    Procedure: PARATHYROIDECTOMY OR EXPLORATION OF PARATHYROID(S);  Surgeon: Karren Burly, MD;  Location: MAIN OR [facility];  Service: ENT    PR REPAIR OF NASAL SEPTUM N/A 10/22/2020    Procedure: SEPTOPLASTY OR SUBMUCOUS RESECTION, WITH OR WITHOUT CARTILAGE SCORING, CONTOURING OR REPLACEMENT WITH GRAFT;  Surgeon: Maeola Sarah, MD;  Location: OR [facility];  Service: ENT    PR REVISE AV FISTULA,W/O THROMBECTOMY Left 06/06/2022    Procedure: REVISON, OPEN, ARTERIOVENOUS FISTULA; WITHOUT THROMBECTOMY, AUTOGENOUS OR NONAUTOGENOUS DIALYSIS GRAFT;  Surgeon: Toledo, Lilyan Punt, MD;  Location: MAIN OR [facility];  Service: Transplant    PR REVISE AV FISTULA,W/O THROMBECTOMY Left 07/05/2022    Procedure: REVISON, OPEN, ARTERIOVENOUS FISTULA; WITHOUT THROMBECTOMY, AUTOGENOUS OR NONAUTOGENOUS DIALYSIS GRAFT;  Surgeon: Toledo, Lilyan Punt, MD;  Location: MAIN OR [facility];  Service: Transplant    PR RIGHT HEART CATH O2 SATURATION & CARDIAC OUTPUT N/A 07/20/2020    Procedure: Right Heart Catheterization with PULMONARY ANGIOGRAM;  Surgeon: Marlaine Hind, MD;  Location: [facility] CATH;  Service: Cardiology    PR TRANSPLANT,PREP RENAL GRAFT/ARTERIAL N/A 09/05/2022    Procedure: [facility] RECONSTRUCTION CADAVER/LIVING DONOR RENAL ALLOGRAFT PRIOR TO TRANSPLANT; ARTERIAL ANASTOMOSIS EAC;  Surgeon: Toledo, Lilyan Punt, MD;  Location: MAIN OR [facility];  Service: Transplant    PR TRANSPLANTATION OF KIDNEY N/A 09/05/2022    Procedure: RENAL ALLOTRANSPLANTATION, IMPLANTATION OF GRAFT; WITHOUT RECIPIENT NEPHRECTOMY;  Surgeon: Toledo, Lilyan Punt, MD;  Location: MAIN OR [facility];  Service: Transplant    PR UPPER GI ENDOSCOPY,BIOPSY N/A 07/31/2022    Procedure: UGI ENDOSCOPY; WITH BIOPSY, SINGLE OR MULTIPLE;  Surgeon: Janace Aris, MD;  Location: GI PROCEDURES [facility];  Service: Gastroenterology    PR UPPER GI ENDOSCOPY,BIOPSY N/A 09/12/2022 Procedure: UGI ENDOSCOPY; WITH BIOPSY, SINGLE OR MULTIPLE;  Surgeon: Zetta Bills, MD;  Location: GI [facility];  Service: Gastroenterology    UMBILICAL HERNIA REPAIR         Social History/Family History:   Reviewed in EMR, I agree with nursing documentation. Additional pertinent social and family history noted in HPI.   Social History     Tobacco Use    Smoking status: Never    Smokeless tobacco: Never   Vaping Use    Vaping status: Never Used   Substance Use Topics    Alcohol use: Not Currently    Drug use: Never     Family History  Problem Relation Age of Onset    Hypertension Mother     Arthritis Mother     Hypertension Father        Home Medications:    Current Facility-Administered Medications:     acetaminophen (TYLENOL) tablet 650 mg, 650 mg, Oral, Q6H PRN, Ermelinda Das, Aidin, MD    albuterol (PROVENTIL HFA;VENTOLIN HFA) 90 mcg/actuation inhaler 1-2 puff, 1-2 puff, Inhalation, Q6H PRN, Ermelinda Das, Aidin, MD    azathioprine (IMURAN) tablet 150 mg, 150 mg, Oral, Daily, Ermelinda Das, Aidin, MD, 150 mg at 09/28/22 0203    bictegrav-emtricit-tenofov ala (BIKTARVY) 50-200-25 mg tablet 1 tablet, 1 tablet, Oral, Q AM, Ermelinda Das, Aidin, MD, 1 tablet at 09/28/22 0813    calamine-zinc oxide 8-8 % lotion, , Topical, QID, Ermelinda Das, Aidin, MD, Given at 09/28/22 1113    carvedilol (COREG) tablet 50 mg, 50 mg, Oral, BID, Ermelinda Das, Aidin, MD, 50 mg at 09/28/22 0818    diphenhydrAMINE (BENADRYL) capsule/tablet 25 mg, 25 mg, Oral, Q6H PRN, Ermelinda Das, Aidin, MD, 25 mg at 09/28/22 0200    docusate sodium (COLACE) capsule 100 mg, 100 mg, Oral, BID PRN, Ermelinda Das, Aidin, MD    gabapentin (NEURONTIN) capsule 100 mg, 100 mg, Oral, TID, Ermelinda Das, Aidin, MD    glucose chewable tablet 16 g, 16 g, Oral, Continuous PRN, Gross, Christopher L, MD    magnesium oxide (MAG-OX) tablet 400 mg, 400 mg, Oral, BID, Gross, Tanna Savoy, MD    methocarbamol (ROBAXIN) tablet 500 mg, 500 mg, Oral, QID, Ermelinda Das, Aidin, MD, 500 mg at 09/28/22 1610    minoxidil (LONITEN) tablet 5 mg, 5 mg, Oral, BID, Ermelinda Das, Aidin, MD, 5 mg at 09/28/22 0201    ondansetron (ZOFRAN-ODT) disintegrating tablet 4 mg, 4 mg, Oral, Q8H PRN, Ermelinda Das, Aidin, MD, 4 mg at 09/27/22 2131    oxyCODONE (ROXICODONE) immediate release tablet 5 mg, 5 mg, Oral, Q4H PRN, 5 mg at 09/28/22 1533 **OR** oxyCODONE (ROXICODONE) immediate release tablet 10 mg, 10 mg, Oral, Q4H PRN, Ermelinda Das, Aidin, MD    pantoprazole (Protonix) EC tablet 40 mg, 40 mg, Oral, BID, Ermelinda Das, Aidin, MD, 40 mg at 09/28/22 0818    predniSONE (DELTASONE) tablet 5 mg, 5 mg, Oral, Daily, Ermelinda Das, Aidin, MD    prochlorperazine (COMPAZINE) injection 2.5 mg, 2.5 mg, Intravenous, Q6H PRN, Ermelinda Das, Aidin, MD, 2.5 mg at 09/28/22 1641    sodium bicarbonate tablet 650 mg, 650 mg, Oral, BID, Ermelinda Das, Aidin, MD    sodium chloride (NS) 0.9 % infusion, 75 mL/hr, Intravenous, Continuous, Ermelinda Das, Aidin, MD, Last Rate: 75 mL/hr at 09/28/22 1005, 75 mL/hr at 09/28/22 1005    tacrolimus (PROGRAF) capsule 11 mg, 11 mg, Oral, BID, Kapoor, Kunal, MD    valGANciclovir (VALCYTE) tablet 450 mg, 450 mg, Oral, Daily, Ermelinda Das, Aidin, MD    ALLERGIES:   Amlodipine, Doxercalciferol, Fish containing products, Iron sucrose, Labetalol, Metoclopramide, Metoprolol, Nifedipine, Other, Sulfamethoxazole-trimethoprim, Vancomycin, Calcitonin, Peanut oil, Potassium clavulanate, Clindamycin, Hydralazine, and Sensipar [cinacalcet]       Physical Exam     ED Triage Vitals   Enc Vitals Group      BP 09/27/22 0627 160/102      Heart Rate 09/27/22 0618 100      SpO2 Pulse --       Resp 09/27/22 0618 18      Temp 09/27/22 0627 37 ??C (98.6 ??F)      Temp Source 09/27/22 9604 Oral  SpO2 09/27/22 0618 100 %      Weight --       Height --       Head Circumference --       Peak Flow --       Pain Score --       Pain Loc --       Pain Education --       Exclude from Growth Chart -- Vital signs reviewed.  GEN: AOx3. Well-nourished, well-developed. No apparent distress. Speaking in full sentences.  EYES: EOM grossly intact. Pupils round and reactive. No scleral icterus. Clear conjunctiva.  HEAD/NECK /MOUTH: [place]/AT. No pharyngeal erythema. Moist mucous membranes.  CARDIO: RRR. No peripheral edema. Normal capillary refill.  LUNG: CTAB. No retractions or accessory muscle use  GI: Non-tender. Non-distended. No masses, rigidity, guarding or rebound.  MSK: Full range of motion. Moving all extremities. No swelling or deformity.  SKIN: No wound dehiscence. No rashes. No lesions. No hematomas, no ecchymosis, no petechiae.  NEURO: AOx3. No facial asymmetry; no focal motor or sensory deficits.  PSYCH: Awake and alert. Appropriate mood and affect.      LABS AND ED TREATMENT:   Lab reviewed and interpreted by me as above in MDM.   Medications given in the ED:   Medications   sodium chloride (NS) 0.9 % infusion (75 mL/hr Intravenous Rate/Dose Verify 09/28/22 1005)   ondansetron (ZOFRAN-ODT) disintegrating tablet 4 mg (4 mg Oral Given 09/27/22 2131)   albuterol (PROVENTIL HFA;VENTOLIN HFA) 90 mcg/actuation inhaler 1-2 puff (has no administration in time range)   docusate sodium (COLACE) capsule 100 mg (has no administration in time range)   bictegrav-emtricit-tenofov ala (BIKTARVY) 50-200-25 mg tablet 1 tablet (1 tablet Oral Given 09/28/22 0813)   pantoprazole (Protonix) EC tablet 40 mg (40 mg Oral Given 09/28/22 0818)   predniSONE (DELTASONE) tablet 5 mg (5 mg Oral Refused 09/28/22 0200)   carvedilol (COREG) tablet 50 mg (50 mg Oral Given 09/28/22 0818)   minoxidil (LONITEN) tablet 5 mg (5 mg Oral Refused 09/28/22 0817)   azathioprine (IMURAN) tablet 150 mg (150 mg Oral Given 09/28/22 0203)   diphenhydrAMINE (BENADRYL) capsule/tablet 25 mg (25 mg Oral Given 09/28/22 0200)   calamine-zinc oxide 8-8 % lotion ( Topical Given 09/28/22 1113)   sodium bicarbonate tablet 650 mg (650 mg Oral Refused 09/28/22 0817)   valGANciclovir (VALCYTE) tablet 450 mg (450 mg Oral Refused 09/28/22 0816)   oxyCODONE (ROXICODONE) immediate release tablet 5 mg (5 mg Oral Given 09/28/22 1533)     Or   oxyCODONE (ROXICODONE) immediate release tablet 10 mg ( Oral See Alternative 09/28/22 1533)   gabapentin (NEURONTIN) capsule 100 mg (100 mg Oral Refused 09/28/22 1309)   methocarbamol (ROBAXIN) tablet 500 mg (500 mg Oral Refused 09/28/22 1113)   acetaminophen (TYLENOL) tablet 650 mg (has no administration in time range)   prochlorperazine (COMPAZINE) injection 2.5 mg (2.5 mg Intravenous Given 09/28/22 1641)   tacrolimus (PROGRAF) capsule 11 mg (has no administration in time range)   glucose chewable tablet 16 g (has no administration in time range)   magnesium oxide (MAG-OX) tablet 400 mg (has no administration in time range)   morphine injection 2 mg (2 mg Intravenous Given 09/27/22 0811)   ondansetron (ZOFRAN) injection 4 mg (4 mg Intravenous Given 09/27/22 0811)   acetaminophen (TYLENOL) tablet 650 mg (650 mg Oral Given 09/27/22 0921)   calcium gluconate in sodium chloride (NS) 0.9% 2 gram/100 mL IVPB 2 g (0 g Intravenous Stopped  09/27/22 1441)   sodium zirconium cyclosilicate (LOKELMA) packet 10 g (10 g Oral Given 09/27/22 1349)   dextrose (D10W) 10% bolus 250 mL (0 mL Intravenous Stopped 09/27/22 1409)     Followed by   insulin regular (HumuLIN,NovoLIN) injection 5 Units (5 Units Intravenous Given 09/27/22 1410)   fentaNYL (PF) (SUBLIMAZE) injection 50 mcg (50 mcg Intravenous Given 09/27/22 1403)   sodium chloride 0.9% (NS) bolus 1,000 mL (0 mL Intravenous Stopped 09/27/22 1755)   ondansetron (ZOFRAN) injection 4 mg (4 mg Intravenous Given 09/27/22 1709)   fentaNYL (PF) (SUBLIMAZE) injection 50 mcg (50 mcg Intravenous Given 09/27/22 1815)   HYDROmorphone (PF) injection Syrg 0.5 mg (0.5 mg Intravenous Given 09/28/22 0208)   sodium zirconium cyclosilicate (LOKELMA) packet 10 g (10 g Oral Given 09/28/22 1131)   dextrose (D10W) 10% bolus 250 mL (250 mL Intravenous New Bag 09/28/22 1132) Followed by   insulin regular (HumuLIN,NovoLIN) injection 5 Units (5 Units Intravenous Given 09/28/22 1131)          Radiology     CT Abdomen Pelvis Wo Contrast   Final Result      1. Status post right lower quadrant transplant kidney without significant hydronephrosis. Nonspecific 2.2 cm subcutaneous collection along the inferior margin of the soft tissue staples. Clinical correlation is recommended to exclude infection.      2. Thickening of the urinary bladder which can be seen with cystitis.      3. Additional findings, as described above.                   I independently visualized the radiology images.     Portions of this record have been created using Scientist, clinical (histocompatibility and immunogenetics). Dictation errors have been sought, but may not have been identified and corrected.      Sabino Gasser, MD  09/28/22 [phone]

## 2022-09-27 NOTE — Unmapped (Signed)
ED Progress Note    Received sign out from previous provider.    Patient Summary: Christian Brown is a 42 y.o. 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   Action List:   Transplant surgery recs    Updates  ED Course as of 09/27/22 1906   Wed Sep 27, 2022   1335 EKG independently viewed myself and shows normal sinus rhythm rate of 86 bpm.  QTc 421.  Does show peaked T waves.  Given his potassium of 5.9, will plan to give calcium, Lokelma as well as insulin and dextrose.   1416 UA with small amounts of leukocyte esterase, negative nitrite, small and some blood and 6 WBC with no bacteria.  Does not appear consistent with infection.  Will wait for urine cultures.   1522 Spoke with surgery team who is recommending CT without contrast for further evaluation.   1755 CT Abdomen Pelvis Wo Contrast    IMPRESSION:     1. Status post right lower quadrant transplant kidney without significant hydronephrosis. Nonspecific 2.2 cm subcutaneous collection along the inferior margin of the soft tissue staples. Clinical correlation is recommended to exclude infection.     2. Thickening of the urinary bladder which can be seen with cystitis.     3. Additional findings, as described above.        [id number] Spoke with surgery who is at the bedside and will remove a few staples.   54 Spoke with transplant surgery who will admit the patient for observation.

## 2022-09-27 NOTE — Unmapped (Signed)
Pt presented to the ED from home with c/o post op problem. Pt had gotten a right kidney transplant on 09/05/22. Pt states he has yellow drainage coming from his incision site. Pt denies fevers. Pt also reports that he has diffuse itchiness and is unsure if that is from a medication that he is taking or from the transplant.

## 2022-09-27 NOTE — Unmapped (Signed)
Transplant Surgery H&P Note    Admitting Service: Transplant Surgery, SRF  Admitting Attending: Dr. Norma Fredrickson  Chief complaint: Post-op incisional pain and drainage associated with nausea    Assessment:  Patient is a 42 y.o. 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 He presented to the ED On 09/27/22 with incisional pain and drainage. No fevers or chills at home, no leukocytosis. CT AP with small superficial fluid collection. 3 staples removed from the inferior aspect of the wound and probed with ~5 mL of serosanguinous output expressed, and packed with plain packing strip. Due to nausea, the patient will be admitted for obs to ensure ability to take immunosuppressive medication and workup cause of nausea.    Plan:   - Admit to SRF  - Daily labs  - Resume home immunosuppressive medication  - Anti-emetics as needed  - AM GI and ID c/s if patient does not improve considering hx of gastritis/esophagitis as well as HIV  - IVF overnight    This patient and plan was discussed with Dr. Norma Fredrickson    Please page [facility] consult pager [phone]) with any questions or concerns.    Vicenta Aly, M.D.  General Surgery, PGY3    History of Present Illness:   Christian Brown is seen in consultation for post-op incisional pain and drainage at the request of Sabino Gasser, MD on the Emergency Medicine service.     Patient is a 42 y.o. 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 He presents to the ED On 09/27/22 with incisional pain and drainage.    Patient states he has been having drainage from the inferior aspect of the incision that he describes as mostly serous. He developed nausea over the last 2 days and has not taken his medications last night and this morning. He states that the symptoms feel different than his previous esophagitis episode. Endorses good UOP at home.     Past Medical History:   Diagnosis Date    Acquired immunodeficiency syndrome (CMS-HCC) 11/14/2013 Formatting of this note might be different from the original.   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   He appears to be doing well. States he is back to 90% except for his leg swelling.    Will change his epivir to 150 mg daily when he completes his liquid rx. Will see him back in 1 month and repeat his labs th    Acute deep vein thrombosis (DVT) of popliteal vein of left lower extremity (CMS-HCC) 02/05/2019    Last Assessment & Plan:   Formatting of this note might be different from the original.  Moderate effusion left knee new the some limitation of motion.  No evidence of sepsis the white count the normal  will     Proceed on with an aspiration for cell count, culture and crystal analysis.  New x-rays ordered and currently pending will follow.  Further therapeutic recommendations contingent on the re    Anemia     Asthma     Candidal esophagitis (CMS-HCC) 07/30/2022    Community acquired pneumonia 07/30/2014    DVT (deep venous thrombosis) (CMS-HCC) 11/24/2013    Last Assessment & Plan:    Formatting of this note might be different from the original.   Appears to be doing better (swelling improved). He will f/u with  PCP regarding coumadin dosing (INR is low).  Formatting of this note might be different from the original.   Last Assessment & Plan:    Formatting of this note might be different from the original.   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   greatly appreciate renal f/u.  Formatting of this note might be different from the original.   Last Assessment & Plan:    greatly appreciate renal f/u.   Last Assessment & Plan:    greatly appreciate renal f/u.  Formatting of this note might be different from the original.   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    Rectal bleeding 04/23/2019    Sleep apnea        Past Surgical History:   Procedure Laterality Date    INTUSSUSCEPTION REPAIR      PERICARDIAL WINDOW      PR AUTOTRANSPLANT, PARATHYROID Bilateral 06/17/2019    Procedure: PARATHYROID AUTOTRANSPLANTATION;  Surgeon: Karren Burly, MD;  Location: MAIN OR [facility];  Service: ENT    PR CREAT AV FISTULA,NON-AUTOGENOUS GRAFT Left 07/05/2022    Procedure: CREATE AV FISTULA (SEPARATE PROC); NONAUTOGENOUS GRAFT (EG, BIOLOGICAL COLLAGEN, THERMOPLASTIC GRAFT), UPPER EXTREMITY;  Surgeon: Toledo, Lilyan Punt, MD;  Location: MAIN OR [facility];  Service: Transplant    PR EXCISION TURBINATE,SUBMUCOUS Bilateral 10/22/2020    Procedure: SUBMUCOUS RESECTION INFERIOR TURBINATE, PARTIAL OR COMPLETE, ANY METHOD;  Surgeon: Maeola Sarah, MD;  Location: OR [facility];  Service: ENT    PR EXPLORE PARATHYROID GLANDS Bilateral 06/17/2019    Procedure: PARATHYROIDECTOMY OR EXPLORATION OF PARATHYROID(S);  Surgeon: Karren Burly, MD;  Location: MAIN OR [facility];  Service: ENT    PR REPAIR OF NASAL SEPTUM N/A 10/22/2020    Procedure: SEPTOPLASTY OR SUBMUCOUS RESECTION, WITH OR WITHOUT CARTILAGE SCORING, CONTOURING OR REPLACEMENT WITH GRAFT;  Surgeon: Maeola Sarah, MD;  Location: OR [facility];  Service: ENT    PR REVISE AV FISTULA,W/O THROMBECTOMY Left 06/06/2022    Procedure: REVISON, OPEN, ARTERIOVENOUS FISTULA; WITHOUT THROMBECTOMY, AUTOGENOUS OR NONAUTOGENOUS DIALYSIS GRAFT;  Surgeon: Toledo, Lilyan Punt, MD;  Location: MAIN OR [facility];  Service: Transplant    PR REVISE AV FISTULA,W/O THROMBECTOMY Left 07/05/2022    Procedure: REVISON, OPEN, ARTERIOVENOUS FISTULA; WITHOUT THROMBECTOMY, AUTOGENOUS OR NONAUTOGENOUS DIALYSIS GRAFT;  Surgeon: Toledo, Lilyan Punt, MD;  Location: MAIN OR [facility];  Service: Transplant    PR RIGHT HEART CATH O2 SATURATION & CARDIAC OUTPUT N/A 07/20/2020    Procedure: Right Heart Catheterization with PULMONARY ANGIOGRAM;  Surgeon: Marlaine Hind, MD;  Location: [facility] CATH;  Service: Cardiology    PR TRANSPLANT,PREP RENAL GRAFT/ARTERIAL N/A 09/05/2022    Procedure: [employer] RECONSTRUCTION CADAVER/LIVING DONOR RENAL ALLOGRAFT PRIOR TO TRANSPLANT; ARTERIAL ANASTOMOSIS EAC;  Surgeon: Toledo, Lilyan Punt, MD;  Location: MAIN OR [facility]  Service: Transplant    PR TRANSPLANTATION OF KIDNEY N/A 09/05/2022    Procedure: RENAL ALLOTRANSPLANTATION, IMPLANTATION OF GRAFT; WITHOUT RECIPIENT NEPHRECTOMY;  Surgeon: Toledo, Lilyan Punt, MD;  Location: MAIN OR [facility];  Service: Transplant    PR UPPER GI ENDOSCOPY,BIOPSY N/A 07/31/2022    Procedure: UGI ENDOSCOPY; WITH BIOPSY, SINGLE OR MULTIPLE;  Surgeon: Janace Aris, MD;  [facility];  Service: Gastroenterology    PR UPPER GI ENDOSCOPY,BIOPSY N/A 09/12/2022    Procedure: UGI ENDOSCOPY; WITH BIOPSY, SINGLE OR MULTIPLE;  Surgeon: Zetta Bills, MD;  [facility]: [facility];  Service: Gastroenterology    UMBILICAL HERNIA REPAIR         Medication:  No current facility-administered medications for this encounter.     Current Outpatient Medications   Medication Sig Dispense Refill    acetaminophen (TYLENOL) 160 mg/5 mL solution Take 20.3 mL (650 mg total) by mouth every six (6) hours as needed for fever or pain. 118 mL 0    albuterol HFA 90 mcg/actuation inhaler Inhale 1-2 puffs every four (4) to six (6) hours as needed for wheezing or shortness of breath. 18 g 11    azathioprine (IMURAN) 50 mg tablet Take 3 tablets (150 mg total) by mouth daily. 90 tablet 11    bictegrav-emtricit-tenofov ala (BIKTARVY) 50-200-25 mg tablet Take 1 tablet by mouth every morning. 30 tablet 11    calamine-zinc oxide 8-8 % Lotn Apply topically four (4) times a day. 177 mL 0    carvedilol (COREG) 25 MG tablet Take 2 tablets (50 mg total) by mouth two (2) times a day. 120 tablet 11    dapsone 100 MG tablet Take 1 tablet (100 mg total) by mouth daily. START taking on 10/05/22 30 tablet 11    diphenhydrAMINE (BENADRYL) 25 mg capsule Take 1 capsule (25 mg total) by mouth every six (6) hours as needed for itching. 100 capsule 0    docusate sodium (COLACE) 100 MG capsule Take 1 capsule (100 mg total) by mouth two (2) times a day as needed for constipation. 60 capsule 11    hydrocortisone 1 % cream Apply topically two (2) times a day. 30 g 11    magnesium oxide-Mg AA chelate (MAGNESIUM, AMINO ACID CHELATE,) 133 mg Take 1 tablet by mouth two (2) times a day. Hold until directed to take by nurse coordinator 60 tablet 11    minoxidil (LONITEN) 2.5 MG tablet Take 2 tablets (5 mg total) by mouth two (2) times a day. 60 tablet 11    naloxone (NARCAN) 4 mg nasal spray SPRAY TO 1 NOSTRIL IN CASE OF OPIOID OVERDOSE, MAY REPEAT IN 2-3 MINUTES UNTIL EMS ARRIVES OR RESPONSIVE      ondansetron (ZOFRAN-ODT) 4 MG disintegrating tablet Dissolve 1 tablet (4 mg total) in the mouth every eight (8) hours as needed for nausea. 30 tablet 1    pantoprazole (PROTONIX) 40 MG tablet Take 1 tablet (40 mg total) by mouth two (2) times a day. 60 tablet 11    predniSONE (DELTASONE) 5 MG tablet Take 1 tablet (5 mg total) by mouth in the morning. 30 tablet 11    sodium bicarbonate 650 mg tablet Take 1 tablet (650 mg total) by mouth two (2) times a day. 60 tablet 2    tacrolimus (PROGRAF) 1 MG capsule Take 3 capsules (3 mg total) by mouth two (2) times a day. Take with 5 mg capsules (for dose adjustments) 180 capsule 11  tacrolimus (PROGRAF) 5 MG capsule Take 2 capsules (10 mg total) by mouth two (2) times a day. Take with one, 1 mg capsule, twice a day for total of 11 mg twice daily 120 capsule 11    valGANciclovir (VALCYTE) 450 mg tablet Take 1 tablet (450 mg total) by mouth daily. 30 tablet 2       Allergies   Allergen Reactions    Amlodipine Hives, Palpitations, Rash and Other (See Comments)     Causes fatigue      Doxercalciferol Hives    Fish Containing Products Hives and Shortness Of Breath     Please do not serve fish for meals. Highly allergic         Iron Sucrose Hives    Labetalol Itching and Rash    Metoclopramide Hives and Other (See Comments)     Causes ticks/ jerks    Metoprolol Hives    Nifedipine Hives, Itching and Shortness Of Breath    Other Swelling     Suture material, localized swelling at closure site    Sulfamethoxazole-Trimethoprim Rash and Other (See Comments)     Renal failure      Vancomycin Hives and Other (See Comments)     Patient states vancomycin caused kidney injury    Calcitonin     Peanut Oil Other (See Comments)    Potassium Clavulanate     Clindamycin Other (See Comments) and Palpitations     Chest pain    Hydralazine Itching and Dizziness           Sensipar [Cinacalcet] Hives       Social History:  Social History     Tobacco Use    Smoking status: Never    Smokeless tobacco: Never   Vaping Use    Vaping status: Never Used   Substance Use Topics    Alcohol use: Not Currently    Drug use: Never       Family History   Problem Relation Age of Onset    Hypertension Mother     Arthritis Mother     Hypertension Father        Review of Systems  10 systems were reviewed and are negative except as noted specifically in the HPI.    Objective:   BP 140/88  - Pulse 87  - Temp 37 ??C (98.6 ??F) (Oral)  - Resp 16  - SpO2 100%     Intake/Output last 3 shifts:  No intake/output data recorded.    Physical Exam:  Vitals:    09/27/22 0636   BP: 140/88   Pulse: 87   Resp: 16   Temp:    SpO2: 100%      General: well appearing, no acute distress   Neuro: AAO x 3, answers questions appropriately  Head: normocephalic, atraumatic, tongue midline  Neck: Supple without masses  Cardiac: regular rate and rhythm  Lungs: Normal work of breathing on room air  Abdomen: Soft, non-distended, non tender. Incision is clean, staples in place, no active drainage. Inferior aspect probed and 3 staples removed with serosanguinous output expressed. Packed with packing strip.  MSK: negative joint tenderness, no deformities      Most Recent Labs:  Lab Results   Component Value Date    WBC 6.0 09/27/2022    HGB 10.7 (L) 09/27/2022    HCT 33.0 (L) 09/27/2022    PLT 161 09/27/2022       Lab Results   Component Value Date  NA 138 09/27/2022    K 5.9 (H) 09/27/2022    CL 115 (H) 09/27/2022    CO2 18.0 (L) 09/27/2022    BUN 14 09/27/2022    CREATININE 1.84 (H) 09/27/2022    CALCIUM 11.9 (H) 09/27/2022    MG 1.5 (L) 09/22/2022    PHOS 2.5 09/21/2022       IMAGING:  No results found.

## 2022-09-28 ENCOUNTER — Ambulatory Visit: Payer: Medicare Other | Admitting: Infectious Disease

## 2022-09-28 DIAGNOSIS — Z940 Kidney transplant status: Principal | ICD-10-CM

## 2022-09-28 LAB — BLOOD GAS, VENOUS
BASE EXCESS VENOUS: -7.2 — ABNORMAL LOW (ref -2.0–2.0)
HCO3 VENOUS: 19 mmol/L — ABNORMAL LOW (ref 22–27)
O2 SATURATION VENOUS: 72.1 % (ref 40.0–85.0)
PCO2 VENOUS: 42 mmHg (ref 40–60)
PH VENOUS: 7.27 — ABNORMAL LOW (ref 7.32–7.43)
PO2 VENOUS: 38 mmHg (ref 30–55)

## 2022-09-28 LAB — COMPREHENSIVE METABOLIC PANEL
ALBUMIN: 3.1 g/dL — ABNORMAL LOW (ref 3.4–5.0)
ALKALINE PHOSPHATASE: 352 U/L — ABNORMAL HIGH (ref 46–116)
ALT (SGPT): <7 U/L — ABNORMAL LOW (ref 10–49)
ANION GAP: 5 mmol/L (ref 5–14)
AST (SGOT): 15 U/L (ref <=34)
BILIRUBIN TOTAL: 0.8 mg/dL (ref 0.3–1.2)
BLOOD UREA NITROGEN: 12 mg/dL (ref 9–23)
BUN / CREAT RATIO: 7
CALCIUM: 11.5 mg/dL — ABNORMAL HIGH (ref 8.7–10.4)
CHLORIDE: 113 mmol/L — ABNORMAL HIGH (ref 98–107)
CO2: 16 mmol/L — ABNORMAL LOW (ref 20.0–31.0)
CREATININE: 1.7 mg/dL — ABNORMAL HIGH
EGFR CKD-EPI (2021) MALE: 52 mL/min/{1.73_m2} — ABNORMAL LOW (ref >=60)
GLUCOSE RANDOM: 83 mg/dL (ref 70–179)
POTASSIUM: 6.1 mmol/L — ABNORMAL HIGH (ref 3.4–4.8)
PROTEIN TOTAL: 6.8 g/dL (ref 5.7–8.2)
SODIUM: 134 mmol/L — ABNORMAL LOW (ref 135–145)

## 2022-09-28 LAB — THIOPURINE METHYLTRANFERASE
6-METHYLMERCAPTOPURINE RIBOSIDE: 8.95 nmol/mL/h
6-METHYLMERCAPTOPURINE: 5.25 nmol/mL/h
6-METHYLTHIOGUANINE RIBOSIDE: 4.17 nmol/mL/h

## 2022-09-28 LAB — CBC
HEMATOCRIT: 34.3 % — ABNORMAL LOW (ref 39.0–48.0)
HEMOGLOBIN: 11.5 g/dL — ABNORMAL LOW (ref 12.9–16.5)
MEAN CORPUSCULAR HEMOGLOBIN CONC: 33.4 g/dL (ref 32.0–36.0)
MEAN CORPUSCULAR HEMOGLOBIN: 31 pg (ref 25.9–32.4)
MEAN CORPUSCULAR VOLUME: 92.8 fL (ref 77.6–95.7)
MEAN PLATELET VOLUME: 8.1 fL (ref 6.8–10.7)
PLATELET COUNT: 159 10*9/L (ref 150–450)
RED BLOOD CELL COUNT: 3.7 10*12/L — ABNORMAL LOW (ref 4.26–5.60)
RED CELL DISTRIBUTION WIDTH: 17.7 % — ABNORMAL HIGH (ref 12.2–15.2)
WBC ADJUSTED: 8.6 10*9/L (ref 3.6–11.2)

## 2022-09-28 LAB — TACROLIMUS LEVEL, TROUGH: TACROLIMUS, TROUGH: 8 ng/mL (ref 5.0–15.0)

## 2022-09-28 LAB — BASIC METABOLIC PANEL
ANION GAP: 5 mmol/L (ref 5–14)
BLOOD UREA NITROGEN: 12 mg/dL (ref 9–23)
BUN / CREAT RATIO: 7
CALCIUM: 11.5 mg/dL — ABNORMAL HIGH (ref 8.7–10.4)
CHLORIDE: 113 mmol/L — ABNORMAL HIGH (ref 98–107)
CO2: 16 mmol/L — ABNORMAL LOW (ref 20.0–31.0)
CREATININE: 1.7 mg/dL — ABNORMAL HIGH
EGFR CKD-EPI (2021) MALE: 52 mL/min/{1.73_m2} — ABNORMAL LOW (ref >=60)
GLUCOSE RANDOM: 83 mg/dL (ref 70–179)
POTASSIUM: 6.1 mmol/L — ABNORMAL HIGH (ref 3.4–4.8)
SODIUM: 134 mmol/L — ABNORMAL LOW (ref 135–145)

## 2022-09-28 LAB — AMYLASE: AMYLASE: 74 U/L (ref 30–118)

## 2022-09-28 LAB — GLUCOSE, RANDOM: GLUCOSE RANDOM: 104 mg/dL (ref 70–179)

## 2022-09-28 LAB — MAGNESIUM
MAGNESIUM: 1.3 mg/dL — ABNORMAL LOW (ref 1.6–2.6)
MAGNESIUM: 1.3 mg/dL — ABNORMAL LOW (ref 1.6–2.6)

## 2022-09-28 LAB — LIPASE: LIPASE: 41 U/L (ref 12–53)

## 2022-09-28 LAB — PHOSPHORUS
PHOSPHORUS: 2 mg/dL — ABNORMAL LOW (ref 2.4–5.1)
PHOSPHORUS: 1.9 mg/dL — ABNORMAL LOW (ref 2.4–5.1)

## 2022-09-28 LAB — POTASSIUM
POTASSIUM: 5.3 mmol/L — ABNORMAL HIGH (ref 3.4–4.8)
POTASSIUM: 5.8 mmol/L — ABNORMAL HIGH (ref 3.5–5.1)

## 2022-09-28 MED ADMIN — insulin regular (HumuLIN,NovoLIN) injection 5 Units: 5 [IU] | INTRAVENOUS | @ 16:00:00 | Stop: 2022-09-28

## 2022-09-28 MED ADMIN — tacrolimus (PROGRAF) capsule 10 mg: 10 mg | ORAL | @ 06:00:00 | Stop: 2022-09-28

## 2022-09-28 MED ADMIN — sodium chloride (NS) 0.9 % infusion: 75 mL/h | INTRAVENOUS | @ 11:00:00

## 2022-09-28 MED ADMIN — oxyCODONE (ROXICODONE) immediate release tablet 5 mg: 5 mg | ORAL | @ 14:00:00 | Stop: 2022-10-11

## 2022-09-28 MED ADMIN — pantoprazole (Protonix) EC tablet 40 mg: 40 mg | ORAL | @ 12:00:00

## 2022-09-28 MED ADMIN — ondansetron (ZOFRAN-ODT) disintegrating tablet 4 mg: 4 mg | ORAL | @ 02:00:00 | NDC 71930001752

## 2022-09-28 MED ADMIN — minoxidil (LONITEN) tablet 5 mg: 5 mg | ORAL | @ 06:00:00

## 2022-09-28 MED ADMIN — methocarbamol (ROBAXIN) tablet 500 mg: 500 mg | ORAL | @ 12:00:00

## 2022-09-28 MED ADMIN — oxyCODONE (ROXICODONE) immediate release tablet 10 mg: 10 mg | ORAL | Stop: 2022-10-11

## 2022-09-28 MED ADMIN — prochlorperazine (COMPAZINE) injection 2.5 mg: 2.5 mg | INTRAVENOUS | @ 21:00:00

## 2022-09-28 MED ADMIN — bictegrav-emtricit-tenofov ala (BIKTARVY) 50-200-25 mg tablet 1 tablet: 1 | ORAL | @ 12:00:00

## 2022-09-28 MED ADMIN — calamine-zinc oxide 8-8 % lotion: TOPICAL | @ 15:00:00

## 2022-09-28 MED ADMIN — HYDROmorphone (PF) injection Syrg 0.5 mg: .5 mg | INTRAVENOUS | @ 06:00:00 | Stop: 2022-09-28

## 2022-09-28 MED ADMIN — diphenhydrAMINE (BENADRYL) capsule/tablet 25 mg: 25 mg | ORAL | @ 06:00:00

## 2022-09-28 MED ADMIN — calamine-zinc oxide 8-8 % lotion: TOPICAL | @ 12:00:00

## 2022-09-28 MED ADMIN — carvedilol (COREG) tablet 50 mg: 50 mg | ORAL | @ 06:00:00

## 2022-09-28 MED ADMIN — prochlorperazine (COMPAZINE) injection 2.5 mg: 2.5 mg | INTRAVENOUS | @ 06:00:00

## 2022-09-28 MED ADMIN — tacrolimus (PROGRAF) capsule 11 mg: 11 mg | ORAL | @ 23:00:00

## 2022-09-28 MED ADMIN — dextrose (D10W) 10% bolus 250 mL: 25 g | INTRAVENOUS | @ 16:00:00 | Stop: 2022-09-28

## 2022-09-28 MED ADMIN — oxyCODONE (ROXICODONE) immediate release tablet 5 mg: 5 mg | ORAL | @ 20:00:00 | Stop: 2022-10-11

## 2022-09-28 MED ADMIN — carvedilol (COREG) tablet 50 mg: 50 mg | ORAL | @ 12:00:00

## 2022-09-28 MED ADMIN — tacrolimus (PROGRAF) capsule 1 mg: 1 mg | ORAL | @ 06:00:00 | Stop: 2022-09-28

## 2022-09-28 MED ADMIN — sodium zirconium cyclosilicate (LOKELMA) packet 10 g: 10 g | ORAL | @ 16:00:00 | Stop: 2022-09-28

## 2022-09-28 MED ADMIN — azathioprine (IMURAN) tablet 150 mg: 150 mg | ORAL | @ 06:00:00

## 2022-09-28 NOTE — Progress Notes (Deleted)
Subjective:  Chief complaint: apparently there was some anxiety generated when his viral load was checked at [facility] and found to be 20 and "detected" (this is of no consequence see below)   Patient ID: Jim Wyatt, male    DOB: 11-28-1980, 42 y.o.   MRN: [id number]  HPI  42 year old black man living with HIV, with end-stage renal disease on hemodialysis.  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  He is being evaluated for possible renal transplantation both at [facility] and [facility].  At [facility] they have been working up his pulmonary hypertension   Per Dr. Wynona Dove note from [facility] patient does have pulmonary hypertension based on echocardiogram and his right heart catheterization.  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  He recommended optimizing volume status and left heart disease investigating other causes of high cardiac output as well as discussion with nephrology idea of ligating AV fistula and switching back to dialysis catheter.  He is also been diagnosed with obstructive sleep apnea encouraged to use BiPAP  The idea of ligating the AV fistula was felt to be not trivial and not particularly a great idea in the context of him also seeking renal transplantation.  He is moving along in the process and is been seen by Tommie Raymond, MD at [facility] with ID and she is happy with him continuing on Biktarvy for his HIV in context of his upcoming renal transplant.   We did talk again about idea of switch to DTG + DOR vs DTG/RPV but Taisean would really prefer to be on Biktarvy.  He has since been seen at [facility] and he relayed to me concern re his viral load being 20 and "detected" and that Transplant Team were concerned by  this.     Past Medical History:  Diagnosis Date  . Asthma   . CAP (community acquired pneumonia) 10/2013  . Cellulitis 11/2013   LLE  . DVT (deep venous thrombosis) (HCC) 10/2013   BLE  . ESRD on hemodialysis (HCC) 01/2015   AKI with nephrotic syndrome (16gm proteinuria at time of biopsy, from collapsing FSG). 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  Went on HD Dec 2016.   [name] Fever   . Genital warts 03/08/2018  . Headache   . HIV infection (HCC) dx'd 10/2013  . Hyperlipidemia 05/25/2022  . Hypertension   . Leg pain   . Multiple environmental allergies    "trees, dogs, cats"  . Multiple food allergies   . Need for prophylactic vaccination against Streptococcus pneumoniae (pneumococcus) 08/28/2017  . Seizures (HCC)    last seizure per pt in Jan  . Shingles < 2010  . Vaccine counseling 02/01/2021    Past Surgical History:  Procedure Laterality Date  . AV FISTULA PLACEMENT Left 03/03/2015   Procedure: Creation of Left Arm RADIOCEPHALIC ARTERIOVENOUS FISTULA ;  Surgeon: Pryor Ochoa, MD;  Location[facility] OR;  Service: Vascular;  Laterality: Left;  . COLONOSCOPY N/A 03/18/2016   Procedure: COLONOSCOPY;  Surgeon: Ruffin Frederick, MD;  Location: [facility] ENDOSCOPY;  Service: Gastroenterology;  Laterality: N/A;  . FEMUR FRACTURE SURGERY Right 09/2001  . FRACTURE SURGERY    . I &  D EXTREMITY Right 12/18/2014   Procedure: IRRIGATION AND DEBRIDEMENT RIGHT LOWER EXTREMITY;  Surgeon: Axel Filler, MD;  Location: [facility] OR;  Service: General;  Laterality: Right;  . INSERTION OF DIALYSIS CATHETER  2016  . LAPAROTOMY N/A 12/28/2015   Procedure: EXPLORATORY LAPAROTOMY BOWEL RESECTION ILEOCECECTOMY;  Surgeon: Berna Bue, MD;  Location: [facility] OR;  Service: General;  Laterality: N/A;    Family History  Problem Relation Age of Onset  . Hypertension Mother   . Diabetes Maternal Grandmother   . Kidney disease Maternal Grandmother        Social History   Socioeconomic History  . Marital status: Single    Spouse name: Not on file  . Number of children: Not on file  . Years of education: Not on file  . Highest education level: Not on file  Occupational History  . Not on file  Tobacco Use  . Smoking status: Never  . Smokeless tobacco: Never  Substance and Sexual Activity  . Alcohol use: No    Comment: I stopped drinking along time ago "  . Drug use: No  . Sexual activity: Not Currently    Partners: Female    Birth control/protection: Condom    Comment: declined condoms 06/2020  Other Topics Concern  . Not on file  Social History Narrative  . Not on file   Social Determinants of Health   Financial Resource Strain: Low Risk  (09/06/2022)   Received from [facility]   Overall Financial Resource Strain (CARDIA)   . Difficulty of Paying Living Expenses: Not hard at all  Food Insecurity: No Food Insecurity (09/06/2022)   Received from [facility]   Hunger Vital Sign   . Worried About Programme researcher, broadcasting/film/video in the Last Year: Never true   . Ran Out of Food in the Last Year: Never true  Transportation Needs: No Transportation Needs (09/06/2022)   Received from [facility]   [facility] - Transportation   . Lack of Transportation (Medical): No   . Lack of Transportation (Non-Medical): No  Physical Activity: Not on file  Stress: Not on file  Social Connections: Unknown (07/07/2021)   Received from [facility]   Social Network   . Social Network: Not on file    Allergies  Allergen Reactions  . Bactrim [Sulfamethoxazole-Trimethoprim] Other (See Comments)    Renal failure  . Doxercalciferol Hives  . Fish Allergy Hives and Shortness Of Breath    Throat closes up Please do not serve fish for meals. Highly allergic  Please do not serve fish for meals. Highly allergic  Please do not serve fish for meals. Highly allergic  Please do not serve fish for meals. Highly allergic    . Iron Sucrose Hives  . Onion  Hives and Other (See Comments)    Per allergy test  . Procardia [Nifedipine] Hives, Shortness Of Breath and Itching  . Reglan [Metoclopramide] Other (See Comments)    Causes ticks/ jerks  . Vancomycin Other (See Comments)    Patient states vancomycin caused kidney injury  . Metoprolol Hives  . Amlodipine Palpitations and Other (See Comments)    Causes fatigue  . Augmentin [Amoxicillin-Pot Clavulanate] Hives  . Calcitriol Hives     Current Outpatient Medications:  .  acetaminophen (TYLENOL) 500 MG tablet, Take 1,000 mg by mouth every 6 (six) hours as needed (pain)., Disp: , Rfl:  .  albuterol (PROVENTIL HFA;VENTOLIN HFA) 108 (90 Base) MCG/ACT inhaler, Inhale 2 puffs into the  lungs every 6 (six) hours as needed for wheezing or shortness of breath., Disp: 1 Inhaler, Rfl: 3 .  bictegravir-emtricitabine-tenofovir AF (BIKTARVY) 50-200-25 MG TABS tablet, Take 1 tablet by mouth daily., Disp: 30 tablet, Rfl: 11 .  carvedilol (COREG) 25 MG tablet, Take 2 tablet by mouth twice a day, Disp: , Rfl:  .  doxycycline (VIBRA-TABS) 100 MG tablet, Take 1 tablet (100 mg total) by mouth 2 (two) times daily. (Patient not taking: Reported on 07/08/2020), Disp: 56 tablet, Rfl: 0 .  hydrALAZINE (APRESOLINE) 25 MG tablet, TAKE 1 TABLET BY MOUTH THREE TIMES A DAY AS DIRECTED (Patient not taking: Reported on 05/25/2022), Disp: , Rfl: 0 .  HYDROcodone-acetaminophen (NORCO/VICODIN) 5-325 MG tablet, , Disp: , Rfl:  .  imiquimod (ALDARA) 5 % cream, Apply a thin layer prior TO bedtime 3x week AND LEAVE ON FOR 6-10 HOURS. Wash off with soap AND WATER.], Disp: 24 each, Rfl: 0 .  ipratropium-albuterol (DUONEB) 0.5-2.5 (3) MG/3ML SOLN, Inhale 1 vial by nebulizer every 6 hours, Disp: 180 mL, Rfl: 1 .  isosorbide mononitrate (IMDUR) 60 MG 24 hr tablet, Take 1 tablet (60 mg total) by mouth daily., Disp: 30 tablet, Rfl: 0 .  losartan (COZAAR) 100 MG tablet, Take 1 tablet (100 mg total) by mouth daily. (Patient not taking: Reported  on 07/08/2020), Disp: 90 tablet, Rfl: 3 .  minoxidil (LONITEN) 10 MG tablet, Take by mouth., Disp: , Rfl:  .  valsartan (DIOVAN) 160 MG tablet, , Disp: , Rfl:    Review of Systems      Objective:   Physical Exam    Fistula present in left arm     Assessment & Plan:   HIV disease:  The viral load of 20 is completely inconsequential and represents a "blip" in context of virus being detected at lower range of the assay. It can also be reported as <20 and "detected" a similarly inconsequential report made by labs that run the assay rather than patients and physicians who rely on them.  The FDA actually uses a cut-off of <50, >50 in terms of "virological success, viremia" with VL >200 only considered "failure with TWO consecutive VL >200  I will recheck it again but this is a blip. It can represent virus being released from cells undergoing apoptosis for example.  Certainly the assay itself can have tremendous variability which has greater impact on this end of the spectrum versus when we are talking about viruses in the thousands of copies I have for example had a patient have 2 viral loads checked in the same day by accident with viral loads reported at 75 and 150 and was told that this variation was well within the variation of the assay by the lab director  The larger question to me was whether the transplant team had anxiety re TAF, newer formulation of tenofovir. I personally feel quite comfortable in its safety profile and I have never seen it in clinical trials or in my hands cause renal toxicity as I have on numerous occasion with less efficient pro-drug TDF.  IF getting rid of TAF was desired I would want him on two fully active drugs and we could look at [place] or Dolutegravir and Doravirine (two pills). Former would require meds taken with chewable meal and avoidance of PPI, H2 blockers, latter would be two pills  One could even consider going with Dovato DESPITE the 184V and  there is literature and experience to support this approach of technicallly only  using one fully active drug + the 3tc to which the virus bears R. That being said I have not begun doing this in my practice and I think more data should accumulate before we consider this  I have called and left VM with his transplant coordinator who is returning to town on Monday.  End-stage renal disease on hemodialysis is going to proceed with transplantation  Pulmonary hypertension: See above discussion   HTN: He is continuing on carvedilol minoxidil and valsartan.  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

## 2022-09-28 NOTE — Unmapped (Signed)
Met with patient briefly to discuss this admission. He has continued stabbing pains in his abdomen and the compazine has helped a little with his nausea. I encouraged him to continue taking the compazine and ask for some for discharge.     He is being treated with multiple medicines for hyperkalemia.He really wants to leave and feel more like himself. I let him know that sometimes time is the only cure and wished that time would be kind to him. I will touch base with him tomorrow about his discharge plan. Loma Boston, RN Inpatient Transplant Nurse Coordinator 09/28/2022 12:15 PM

## 2022-09-28 NOTE — Unmapped (Incomplete)
Transplant Surgery Progress Note    Hospital Day: 3    HPI:  Christian Brown is seen in consultation for post-op incisional pain and drainage at the request of Sabino Gasser, MD on the Emergency Medicine service.      Patient is a 42 y.o. 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 He presents to the ED On 09/27/22 with incisional pain and drainage.     Patient states he has been having drainage from the inferior aspect of the incision that he describes as mostly serous. He developed nausea over the last 2 days and has not taken his medications last night and this morning. He states that the symptoms feel different than his previous esophagitis episode. Endorses good UOP at home.     Assessment:     Christian Brown  a 42 y.o. 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 He presented to the ED On 09/27/22 with incisional pain and drainage. No fevers or chills at home, no leukocytosis. CT AP with small superficial fluid collection. 3 staples removed from the inferior aspect of the wound and probed with ~5 mL of serosanguinous output expressed, and packed with plain packing strip. Due to nausea, the patient will be admitted for obs to ensure ability to take immunosuppressive medication and workup cause of nausea.     Interval Events:     No acute events overnight. Pain is well controlled.  Vital signs are stable. Urine output is appropriate.         On azathioprine, bikatrvy,prednison    NAEO,  50 fentanyl  HR 80-90s  BP 174/104, Map 122  UOP - not recorded  BM - not recorded    WBC 6.0    Hb 10.7 (baseline -10)  Plt 161    ESR 68    Na 138  K 5.9 - High, sevalemer  Bun/Cr 14/1.84 (13-16/1.96-2.0)  Mg1.5    AST/ALT 19/7  ALP 348      Urine Cx pending  UA 6 WBC, 44 RBC  COVID 19 Negtive    EKG read pending, qtc 241    CT AP, pending      AVF  lef arm, left upper arm  PIV rightarm  SS in rightabodmen    Plan:     Neuro:   - Pain well controlled    CV:   - HDS, Maintain SBP < 180    Pulm:   - {JAS RESP ZOXWR:60454}    GI:   - F: {JAS UJWJXB:14782}   - E: Replete as needed   - N: {JAS NFAO:13086}   - pepcid/pantoprazole, zofran, colace, miralax    GU:   - *** day foley catheter   - Post-op RUS with ***    Endo:   -glucose checks ACHS    -SSI***    Heme/ID:   - Afebrile, WBC and Hgb stable   - ppx with nystatin, valcyte, bactrim    Immuno:   - tac, cellcept, steroids  - pharmacy recommendations for dosing      Dispo   - {DISPO:28085}      Objective:        Vital Signs:  BP 115/63  - Pulse 84  - Temp 36.9 ??C (98.4 ??F) (Oral)  - Resp 18  - Ht 170.2 cm (5' 7)  - Wt 73.7 kg (162 lb 7.7 oz)  - SpO2 100%  - BMI 25.45 kg/m??  Input/Output:  I/O last 3 completed shifts:  In: -   Out: 1325 [Urine:1325]    Physical Exam:    General: Cooperative, no distress, well appearing male  Pulmonary: Normal work of breathing, on room air***  Cardiovascular: Regular rate, ***  Abdomen: Soft, non-tender, non-distended. Surgical incisions and drain sites without signs of infection. Well healing.   Musculoskeletal: Moves extremities spontaneously   Neurologic: Alert and interactive, grossly intact    Labs:  Lab Results   Component Value Date    WBC 8.6 09/28/2022    HGB 11.5 (L) 09/28/2022    HCT 34.3 (L) 09/28/2022    PLT 159 09/28/2022       Lab Results   Component Value Date    NA 134 (L) 09/28/2022    NA 134 (L) 09/28/2022    K 5.8 (H) 09/28/2022    CL 113 (H) 09/28/2022    CL 113 (H) 09/28/2022    CO2 16.0 (L) 09/28/2022    CO2 16.0 (L) 09/28/2022    BUN 12 09/28/2022    BUN 12 09/28/2022    CREATININE 1.70 (H) 09/28/2022    CREATININE 1.70 (H) 09/28/2022    CALCIUM 11.5 (H) 09/28/2022    CALCIUM 11.5 (H) 09/28/2022    MG 1.3 (L) 09/28/2022    MG 1.3 (L) 09/28/2022    PHOS 2.0 (L) 09/28/2022    PHOS 1.9 (L) 09/28/2022       Microbiology Results (last day)       Procedure Component Value Date/Time Date/Time    Urine Culture [[id number]]  (Normal) Collected: 09/27/22 1322    Lab Status: Preliminary result Specimen: Urine from Clean Catch Updated: 09/28/22 1115     Urine Culture, Comprehensive TOO YOUNG TO READ    Narrative:      Specimen Source: Clean Catch            Imaging:  All pertinent imaging personally reviewed.        Surgery History and Physical Note      Attending Physician:  Leona Carry, MD  Inpatient Service:  Surg Transplant [facility])  Date: 09/28/2022            Assessment :  Christian Brown  a 42 y.o. 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 He presented to the ED On 09/27/22 with incisional pain and drainage. No fevers or chills at home, no leukocytosis. CT AP with small superficial fluid collection. 3 staples removed from the inferior aspect of the wound and probed with ~5 mL of serosanguinous output expressed, and packed with plain packing strip. Due to nausea, the patient will be admitted for obs to ensure ability to take immunosuppressive medication and workup cause of nausea.           Plan:   - Admit to SRF  - Daily labs  - Resume home immunosuppressive medication  - Anti-emetics as needed  - AM GI and ID c/s if patient does not improve considering hx of gastritis/esophagitis as well as HIV  - IVF overnight     This patient and plan was discussed with Dr. Norma Fredrickson     Please page [facility] consult pager [phone]) with any questions or concerns.        Plan:  - Admission to [facility]  - Pre-transplant laboratory workup is in progress  - Additional imaging: ***  - Planned OR time: ***  - Planned induction therapy: ***    History of Present  Illness:   Chief Complaint:      Christian Brown is seen in consultation for post-op incisional pain and drainage at the request of Sabino Gasser, MD on the Emergency Medicine service.      Patient is a 42 y.o. 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 He presents to the ED On 09/27/22 with incisional pain and drainage.     Patient states he has been having drainage from the inferior aspect of the incision that he describes as mostly serous. He developed nausea over the last 2 days and has not taken his medications last night and this morning. He states that the symptoms feel different than his previous esophagitis episode. Endorses good UOP at home.         ***Liver Transplant***  Christian Brown is a 42 y.o. male with history of ESLD secondary to *** who presents for evaluation for orthotopic liver transplant. They were initially diagnosed with liver disease in ***. They have *** ascites, which is managed with ***. They deny a history of bleeding varices***. They do not *** have a TIPS in place. They have *** encephalopathy, and take *** to manage this. They do *** have portal vein thrombosis. They are not *** on therapeutic anticoagulation. Their most recent MELD-Na was *** on ***. They are HCV ***. They do *** have HCC.    ***Renal Transplant***  Christian Brown is a 42 y.o. male with history of ESRD secondary to *** who presents for evaluation for deceased donor renal transplant. They were initially diagnosed with CKD in *** and have been on ***Hemodialysis/peritoneal dialysis since ***. The receive iHD on *** through a *** AV-***. They are *** anuric.  They have *** history of peripheral vascular disease.    Today, they feel well. They deny any recent illnesses or sick contacts***. They chest pain, shortness of breath, cough, or wheezing ***.    Allergies  Allergies   Allergen Reactions    Amlodipine Hives, Palpitations, Rash and Other (See Comments)     Causes fatigue      Doxercalciferol Hives    Fish Containing Products Hives and Shortness Of Breath     Please do not serve fish for meals. Highly allergic         Iron Sucrose Hives    Labetalol Itching and Rash    Metoclopramide Hives and Other (See Comments)     Causes ticks/ jerks    Metoprolol Hives    Nifedipine Hives, Itching and Shortness Of Breath    Other Swelling     Suture material, localized swelling at closure site Sulfamethoxazole-Trimethoprim Rash and Other (See Comments)     Renal failure      Vancomycin Hives and Other (See Comments)     Patient states vancomycin caused kidney injury    Calcitonin     Peanut Oil Other (See Comments)    Potassium Clavulanate     Clindamycin Other (See Comments) and Palpitations     Chest pain    Hydralazine Itching and Dizziness           Sensipar [Cinacalcet] Hives         Medications    No current facility-administered medications on file prior to encounter.     Current Outpatient Medications on File Prior to Encounter   Medication Sig Dispense Refill    acetaminophen (TYLENOL) 160 mg/5 mL solution Take 20.3 mL (650 mg total) by mouth every six (6) hours as  needed for fever or pain. 118 mL 0    albuterol HFA 90 mcg/actuation inhaler Inhale 1-2 puffs every four (4) to six (6) hours as needed for wheezing or shortness of breath. 18 g 11    azathioprine (IMURAN) 50 mg tablet Take 3 tablets (150 mg total) by mouth daily. 90 tablet 11    bictegrav-emtricit-tenofov ala (BIKTARVY) 50-200-25 mg tablet Take 1 tablet by mouth every morning. 30 tablet 11    calamine-zinc oxide 8-8 % Lotn Apply topically four (4) times a day. 177 mL 0    carvedilol (COREG) 25 MG tablet Take 2 tablets (50 mg total) by mouth two (2) times a day. 120 tablet 11    dapsone 100 MG tablet Take 1 tablet (100 mg total) by mouth daily. START taking on 10/05/22 30 tablet 11    diclofenac sodium (VOLTAREN) 1 % gel Apply 2 g topically four (4) times a day.      diphenhydrAMINE (BENADRYL) 25 mg capsule Take 1 capsule (25 mg total) by mouth every six (6) hours as needed for itching. 100 capsule 0    docusate sodium (COLACE) 100 MG capsule Take 1 capsule (100 mg total) by mouth two (2) times a day as needed for constipation. 60 capsule 11    HYDROcodone-acetaminophen (NORCO) 5-325 mg per tablet Take 1 tablet by mouth as needed for pain. For moderate to severe chronic pain      hydrocortisone 1 % cream Apply topically two (2) times a day. 30 g 11    magnesium oxide-Mg AA chelate (MAGNESIUM, AMINO ACID CHELATE,) 133 mg Take 1 tablet by mouth two (2) times a day. Hold until directed to take by nurse coordinator 60 tablet 11    minoxidil (LONITEN) 2.5 MG tablet Take 2 tablets (5 mg total) by mouth two (2) times a day. 60 tablet 11    naloxone (NARCAN) 4 mg nasal spray SPRAY TO 1 NOSTRIL IN CASE OF OPIOID OVERDOSE, MAY REPEAT IN 2-3 MINUTES UNTIL EMS ARRIVES OR RESPONSIVE      ondansetron (ZOFRAN-ODT) 4 MG disintegrating tablet Dissolve 1 tablet (4 mg total) in the mouth every eight (8) hours as needed for nausea. 30 tablet 1    [EXPIRED] oxyCODONE (ROXICODONE) 5 MG immediate release tablet Take 1 tablet (5 mg total) by mouth every four (4) hours as needed for pain for up to 5 days. 10 tablet 0    pantoprazole (PROTONIX) 40 MG tablet Take 1 tablet (40 mg total) by mouth two (2) times a day. 60 tablet 11    predniSONE (DELTASONE) 5 MG tablet Take 1 tablet (5 mg total) by mouth in the morning. 30 tablet 11    sodium bicarbonate 650 mg tablet Take 1 tablet (650 mg total) by mouth two (2) times a day. 60 tablet 2    tacrolimus (PROGRAF) 1 MG capsule Take 3 capsules (3 mg total) by mouth two (2) times a day. Take with 5 mg capsules (for dose adjustments) 180 capsule 11    tacrolimus (PROGRAF) 5 MG capsule Take 2 capsules (10 mg total) by mouth two (2) times a day. Take with one, 1 mg capsule, twice a day for total of 11 mg twice daily 120 capsule 11    valGANciclovir (VALCYTE) 450 mg tablet Take 1 tablet (450 mg total) by mouth daily. 30 tablet 2         Past Medical History  Past Medical History:   Diagnosis Date    Acquired immunodeficiency syndrome (CMS-HCC)  11/14/2013    Formatting of this note might be different from the original.   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   He appears to be doing well. States he is back to 90% except for his leg swelling.    Will change his epivir to 150 mg daily when he completes his liquid rx. Will see him back in 1 month and repeat his labs th    Acute deep vein thrombosis (DVT) of popliteal vein of left lower extremity (CMS-HCC) 02/05/2019    Last Assessment & Plan:   Formatting of this note might be different from the original.  Moderate effusion left knee new the some limitation of motion.  No evidence of sepsis the white count the normal  will     Proceed on with an aspiration for cell count, culture and crystal analysis.  New x-rays ordered and currently pending will follow.  Further therapeutic recommendations contingent on the re    Anemia     Asthma     Candidal esophagitis (CMS-HCC) 07/30/2022    Community acquired pneumonia 07/30/2014    DVT (deep venous thrombosis) (CMS-HCC) 11/24/2013    Last Assessment & Plan:    Formatting of this note might be different from the original.   Appears to be doing better (swelling improved). He will f/u with PCP regarding coumadin dosing (INR is low).  Formatting of this note might be different from the original.   Last Assessment & Plan:    Formatting of this note might be different from the original.   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   greatly appreciate renal f/u.  Formatting of this note might be different from the original.   Last Assessment & Plan:    greatly appreciate renal f/u.   Last Assessment & Plan:    greatly appreciate renal f/u.  Formatting of this note might be different from the original.   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    Rectal bleeding 04/23/2019    Sleep apnea          Past Surgical History  Past Surgical History:   Procedure Laterality Date    INTUSSUSCEPTION REPAIR      PERICARDIAL WINDOW      PR AUTOTRANSPLANT, PARATHYROID Bilateral 06/17/2019    Procedure: PARATHYROID AUTOTRANSPLANTATION;  Surgeon: Karren Burly, MD;  Location: MAIN OR [place];  Service: ENT    PR CREAT AV FISTULA,NON-AUTOGENOUS GRAFT Left 07/05/2022    Procedure: CREATE AV FISTULA (SEPARATE PROC); NONAUTOGENOUS GRAFT (EG, BIOLOGICAL COLLAGEN, THERMOPLASTIC GRAFT), UPPER EXTREMITY;  Surgeon: Toledo, Lilyan Punt, MD;  Location: MAIN OR [facility];  Service: Transplant    PR EXCISION TURBINATE,SUBMUCOUS  Bilateral 10/22/2020    Procedure: SUBMUCOUS RESECTION INFERIOR TURBINATE, PARTIAL OR COMPLETE, ANY METHOD;  Surgeon: Maeola Sarah, MD;  Location: OR [facility];  Service: ENT    PR EXPLORE PARATHYROID GLANDS Bilateral 06/17/2019    Procedure: PARATHYROIDECTOMY OR EXPLORATION OF PARATHYROID(S);  Surgeon: Karren Burly, MD;  Location: MAIN OR [facility];  Service: ENT    PR REPAIR OF NASAL SEPTUM N/A 10/22/2020    Procedure: SEPTOPLASTY OR SUBMUCOUS RESECTION, WITH OR WITHOUT CARTILAGE SCORING, CONTOURING OR REPLACEMENT WITH GRAFT;  Surgeon: Maeola Sarah, MD;  Location: OR [facility];  Service: ENT    PR REVISE AV FISTULA,W/O THROMBECTOMY Left 06/06/2022    Procedure: REVISON, OPEN, ARTERIOVENOUS FISTULA; WITHOUT THROMBECTOMY, AUTOGENOUS OR NONAUTOGENOUS DIALYSIS GRAFT;  Surgeon: Toledo, Lilyan Punt, MD;  Location: MAIN OR [facility];  Service: Transplant    PR REVISE AV FISTULA,W/O THROMBECTOMY Left 07/05/2022    Procedure: REVISON, OPEN, ARTERIOVENOUS FISTULA; WITHOUT THROMBECTOMY, AUTOGENOUS OR NONAUTOGENOUS DIALYSIS GRAFT;  Surgeon: Toledo, Lilyan Punt, MD;  Location: MAIN OR [facility];  Service: Transplant    PR RIGHT HEART CATH O2 SATURATION & CARDIAC OUTPUT N/A 07/20/2020    Procedure: Right Heart Catheterization with PULMONARY ANGIOGRAM;  Surgeon: Marlaine Hind, MD;  Location: [facility] CATH;  Service: Cardiology    PR TRANSPLANT,PREP RENAL GRAFT/ARTERIAL N/A 09/05/2022    Procedure: [facility] RECONSTRUCTION CADAVER/LIVING DONOR RENAL ALLOGRAFT PRIOR TO TRANSPLANT; ARTERIAL ANASTOMOSIS EAC;  Surgeon: Toledo, Lilyan Punt, MD;  Location: MAIN OR [facility];  Service: Transplant    PR TRANSPLANTATION OF KIDNEY N/A 09/05/2022    Procedure: RENAL ALLOTRANSPLANTATION, IMPLANTATION OF GRAFT; WITHOUT RECIPIENT NEPHRECTOMY;  Surgeon: Toledo, Lilyan Punt, MD;  Location: MAIN OR [facility];  Service: Transplant    PR UPPER GI ENDOSCOPY,BIOPSY N/A 07/31/2022    Procedure: UGI ENDOSCOPY; WITH BIOPSY, SINGLE OR MULTIPLE;  Surgeon: Janace Aris, MD;  Location: GI PROCEDURES [facility];  Service: Gastroenterology    PR UPPER GI ENDOSCOPY,BIOPSY N/A 09/12/2022    Procedure: UGI ENDOSCOPY; WITH BIOPSY, SINGLE OR MULTIPLE;  Surgeon: Zetta Bills, MD;  Location: GI PROC[facility]URES [facility];  Service: Gastroenterology    UMBILICAL HERNIA REPAIR           Family History  Family History   Problem Relation Age of Onset    Hypertension Mother     Arthritis Mother     Hypertension Father          Social History:  Social History     Tobacco Use    Smoking status: Never    Smokeless tobacco: Never   Vaping Use    Vaping status: Never Used   Substance Use Topics    Alcohol use: Not Currently    Drug use: Never         Review of Systems  {GSC ZOX:[id number]}      Vital Signs    Patient Vitals for the past 8 hrs:   BP Temp Temp src Pulse Resp   09/28/22 0329 -- 36.6 ??C (97.8 ??F) Oral -- --   09/27/22 2357 174/104 -- -- 90 17       Physical Exam  General Appearance: ***male in no acute distress. Alert and oriented x 3.   Head:  Normocephalic, atraumatic.  Eyes: Conjunctiva and lids appear normal. Pupils equal, round, and reactive to light. Sclera anicteric.  Nose: Nares grossly normal, no drainage.  Neck: Supple, symmetrical. No appreciable thyromegaly or nodules.   Pulmonary: Normal respiratory effort. Lungs clear to ausculation bilaterally. No rhonchi. No  wheezes.  Cardiovascular: Regular rate and rhythm. No murmurs, rubs or gallops appreciated. Palpable radial, femoral***, and DP pulses  Abdomen: Soft, non-tender, without masses. No hepatosplenomegaly. No hernias. No signs of prior incisional scars  Musculoskeletal: Extremities without clubbing, cyanosis or edema.  Neurologic:  No motor abnormalities noted. Sensation grossly intact.  Lymphatic: No cervical or supraclavicular lymphadenopathy.   Skin:  Skin color normal. No rashes or lesions. No Jaundice  Psychiatric: Judgement and insight seem appropriate. Oriented to person, place and time.    Labs and Studies  Labs:    Na 138  K 5.9 - High    Bun/Cr  Recent Results (from the past 24 hour(s))   Comprehensive Metabolic Panel    Collection Time: 09/27/22  6:36 AM   Result Value Ref Range    Sodium 138 135 - 145 mmol/L    Potassium 5.9 (H) 3.4 - 4.8 mmol/L    Chloride 115 (H) 98 - 107 mmol/L    CO2 18.0 (L) 20.0 - 31.0 mmol/L    Anion Gap 5 5 - 14 mmol/L    BUN 14 9 - 23 mg/dL    Creatinine 0.98 (H) 0.73 - 1.18 mg/dL    BUN/Creatinine Ratio 8     eGFR CKD-EPI (2021) Male 47 (L) >=60 mL/min/1.69m2    Glucose 99 70 - 179 mg/dL    Calcium 11.9 (H) 8.7 - 10.4 mg/dL    Albumin 3.5 3.4 - 5.0 g/dL    Total Protein 7.0 5.7 - 8.2 g/dL    Total Bilirubin 0.6 0.3 - 1.2 mg/dL    AST 19 <=14 U/L    ALT <7 (L) 10 - 49 U/L    Alkaline Phosphatase 348 (H) 46 - 116 U/L   COVID PCR    Collection Time: 09/27/22  6:36 AM    Specimen: Nasopharyngeal Swab   Result Value Ref Range    SARS-CoV-2 PCR Negative Negative   CBC w/ Differential    Collection Time: 09/27/22  6:36 AM   Result Value Ref Range    WBC 6.0 3.6 - 11.2 10*9/L    RBC 3.54 (L) 4.26 - 5.60 10*12/L    HGB 10.7 (L) 12.9 - 16.5 g/dL    HCT 78.2 (L) 95.6 - 48.0 %    MCV 93.4 77.6 - 95.7 fL    MCH 30.3 25.9 - 32.4 pg    MCHC 32.5 32.0 - 36.0 g/dL    RDW 21.3 (H) 08.6 - 15.2 %    MPV 8.1 6.8 - 10.7 fL    Platelet 161 150 - 450 10*9/L    Neutrophils % 87.0 %    Lymphocytes % 7.0 %    Monocytes % 4.4 %    Eosinophils % 0.4 %    Basophils % 1.2 %    Absolute Neutrophils 5.2 1.8 - 7.8 10*9/L    Absolute Lymphocytes 0.4 (L) 1.1 - 3.6 10*9/L    Absolute Monocytes 0.3 0.3 - 0.8 10*9/L    Absolute Eosinophils 0.0 0.0 - 0.5 10*9/L    Absolute Basophils 0.1 0.0 - 0.1 10*9/L    Anisocytosis Slight (A) Not Present   Sedimentation rate, manual    Collection Time: 09/27/22  6:36 AM   Result Value Ref Range    Sed Rate 38 (H) 0 - 15 mm/h   C-reactive protein    Collection Time: 09/27/22  6:36 AM   Result Value Ref Range    CRP 16.0 (H) <=10.0 mg/L   Urinalysis with  Microscopy with Culture Reflex    Collection Time: 09/27/22  1:22 PM   Result Value Ref Range    Color, UA Light Yellow     Clarity, UA Clear     Specific Gravity, UA 1.013 1.003 - 1.030    pH, UA 6.5 5.0 - 9.0    Leukocyte Esterase, UA Small (A) Negative    Nitrite, UA Negative Negative    Protein, UA Trace (A) Negative    Glucose, UA Negative Negative    Ketones, UA Negative Negative    Urobilinogen, UA <2.0 mg/dL <9.0 mg/dL    Bilirubin, UA Negative Negative    Blood, UA Small (A) Negative    RBC, UA 44 (H) <=3 /HPF    WBC, UA 6 (H) <=2 /HPF    Squam Epithel, UA <1 0 - 5 /HPF    Bacteria, UA None Seen None Seen /HPF   ECG 12 Lead    Collection Time: 09/27/22  1:26 PM   Result Value Ref Range    EKG Systolic BP  mmHg    EKG Diastolic BP  mmHg    EKG Ventricular Rate 86 BPM    EKG Atrial Rate 86 BPM    EKG P-R Interval 206 ms    EKG QRS Duration 108 ms    EKG Q-T Interval 352 ms    EKG QTC Calculation 421 ms    EKG Calculated P Axis 73 degrees    EKG Calculated R Axis -6 degrees    EKG Calculated T Axis 81 degrees    QTC Fredericia 397 ms Imaging:   ECHO ***:     Stress Test: ***    CT A/P: ***    MR Liver: ***

## 2022-09-28 NOTE — Unmapped (Signed)
Bed: 64-D  Expected date:   Expected time:   Means of arrival:   Comments:  Team B patient

## 2022-09-28 NOTE — Unmapped (Addendum)
Tacrolimus Therapeutic Monitoring Pharmacy Note    Christian Brown is a 42 y.o. male continuing tacrolimus.     Indication: Kidney transplant     Date of Transplant:  09/05/22       Prior Dosing Information: Home regimen 11 mg BID      Source(s) of information used to determine prior to admission dosing:  Pharmacy Plan    Goals:  Therapeutic Drug Levels  Tacrolimus trough goal: 8-11 ng/ml    Additional Clinical Monitoring/Outcomes  Monitor renal function (SCr and urine output) and liver function (LFTs)  Monitor for signs/symptoms of adverse events (e.g., hyperglycemia, hyperkalemia, hypomagnesemia, hypertension, headache, tremor)    Results:   Tacrolimus level: Not applicable    Pharmacokinetic Considerations and Significant Drug Interactions:  Concurrent hepatotoxic medications: None identified  Concurrent CYP3A4 substrates/inhibitors: None identified  Concurrent nephrotoxic medications: None identified    Assessment/Plan:  Recommendedation(s)  Continue current regimen of 11 mg po BID    Follow-up  Next level to be determined by primary team.   A pharmacist will continue to monitor and recommend levels as appropriate    Please page service pharmacist with questions/clarifications.    Lilyan Punt, PharmD

## 2022-09-28 NOTE — Unmapped (Signed)
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 No    Legal Next of Kin / Guardian / POA / Advance Directives    HCDM (patient stated preference): [name] - Mother - [phone]                   Discharge Planning  Discharge Planning Information:   Type of Residence   Mailing Address:  [address]  [place] [address]    Medical Information   Past Medical History:   Diagnosis Date    Acquired immunodeficiency syndrome (CMS-HCC) 11/14/2013    Formatting of this note might be different from the original.   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   He appears to be doing well. States he is back to 90% except for his leg swelling.    Will change his epivir to 150 mg daily when he completes his liquid rx. Will see him back in 1 month and repeat his labs th    Acute deep vein thrombosis (DVT) of popliteal vein of left lower extremity (CMS-HCC) 02/05/2019    Last Assessment & Plan:   Formatting of this note might be different from the original.  Moderate effusion left knee new the some limitation of motion.  No evidence of sepsis the white count the normal  will     Proceed on with an aspiration for cell count, culture and crystal analysis.  New x-rays ordered and currently pending will follow.  Further therapeutic recommendations contingent on the re    Anemia     Asthma Candidal esophagitis (CMS-HCC) 07/30/2022    Community acquired pneumonia 07/30/2014    DVT (deep venous thrombosis) (CMS-HCC) 11/24/2013    Last Assessment & Plan:    Formatting of this note might be different from the original.   Appears to be doing better (swelling improved). He will f/u with PCP regarding coumadin dosing (INR is low).  Formatting of this note might be different from the original.   Last Assessment & Plan:    Formatting of this note might be different from the original.   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   greatly appreciate renal f/u.  Formatting of this note might be different from the original.   Last Assessment & Plan:    greatly appreciate renal f/u.   Last Assessment & Plan:    greatly appreciate renal f/u.  Formatting of this note might be different from the original.   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    Rectal bleeding 04/23/2019    Sleep apnea        Past Surgical History:   Procedure Laterality Date    INTUSSUSCEPTION REPAIR      PERICARDIAL WINDOW      PR AUTOTRANSPLANT, PARATHYROID Bilateral 06/17/2019    Procedure: PARATHYROID AUTOTRANSPLANTATION;  Surgeon: Karren Burly, MD;  Location: MAIN OR [place];  Service: ENT    PR CREAT AV FISTULA,NON-AUTOGENOUS GRAFT Left 07/05/2022    Procedure: CREATE AV FISTULA (SEPARATE PROC); NONAUTOGENOUS GRAFT (EG, BIOLOGICAL COLLAGEN, THERMOPLASTIC GRAFT), UPPER EXTREMITY;  Surgeon: Toledo, Lilyan Punt, MD;  Location: MAIN OR [facility];  Service: Transplant    PR EXCISION TURBINATE,SUBMUCOUS Bilateral 10/22/2020    Procedure: SUBMUCOUS RESECTION INFERIOR TURBINATE, PARTIAL OR COMPLETE, ANY METHOD;  Surgeon: Maeola Sarah, MD;  Location: OR [facility];  Service: ENT    PR EXPLORE PARATHYROID GLANDS Bilateral 06/17/2019    Procedure: PARATHYROIDECTOMY OR EXPLORATION OF PARATHYROID(S);  Surgeon: Karren Burly, MD;  Location: MAIN OR [facility];  Service: ENT    PR REPAIR OF NASAL SEPTUM N/A 10/22/2020    Procedure: SEPTOPLASTY OR SUBMUCOUS RESECTION, WITH OR WITHOUT CARTILAGE SCORING, CONTOURING OR REPLACEMENT WITH GRAFT;  Surgeon: Maeola Sarah, MD;  Location: OR [facility];  Service: ENT    PR REVISE AV FISTULA,W/O THROMBECTOMY Left 06/06/2022    Procedure: REVISON, OPEN, ARTERIOVENOUS FISTULA; WITHOUT THROMBECTOMY, AUTOGENOUS OR NONAUTOGENOUS DIALYSIS GRAFT;  Surgeon: Toledo, Lilyan Punt, MD;  Location: MAIN OR [facility];  Service: Transplant    PR REVISE AV FISTULA,W/O THROMBECTOMY Left 07/05/2022    Procedure: REVISON, OPEN, ARTERIOVENOUS FISTULA; WITHOUT THROMBECTOMY, AUTOGENOUS OR NONAUTOGENOUS DIALYSIS GRAFT;  Surgeon: Toledo, Lilyan Punt, MD;  Location: MAIN OR [facility];  Service: Transplant    PR RIGHT HEART CATH O2 SATURATION & CARDIAC OUTPUT N/A 07/20/2020    Procedure: Right Heart Catheterization with PULMONARY ANGIOGRAM;  Surgeon: Marlaine Hind, MD;  Location: [facility];  Service: Cardiology    PR TRANSPLANT,PREP RENAL GRAFT/ARTERIAL N/A 09/05/2022    Procedure: [facility] RECONSTRUCTION CADAVER/LIVING DONOR RENAL ALLOGRAFT PRIOR TO TRANSPLANT; ARTERIAL ANASTOMOSIS EAC;  Surgeon: Toledo, Lilyan Punt, MD;  Location: MAIN OR [facility];  Service: Transplant    PR TRANSPLANTATION OF KIDNEY N/A 09/05/2022    Procedure: RENAL ALLOTRANSPLANTATION, IMPLANTATION OF GRAFT; WITHOUT RECIPIENT NEPHRECTOMY;  Surgeon: Toledo, Lilyan Punt, MD;  Location: MAIN OR [facility];  Service: Transplant    PR UPPER GI ENDOSCOPY,BIOPSY N/A 07/31/2022    Procedure: UGI ENDOSCOPY; WITH BIOPSY, SINGLE OR MULTIPLE;  Surgeon: Janace Aris, MD;  [facility]: [facility];  Service: Gastroenterology    PR UPPER GI ENDOSCOPY,BIOPSY N/A 09/12/2022    Procedure: UGI ENDOSCOPY; WITH BIOPSY, SINGLE OR MULTIPLE;  Surgeon: Zetta Bills, MD;  Location: [facility];  Service: Gastroenterology    UMBILICAL HERNIA REPAIR         Family History   Problem Relation Age of Onset    Hypertension Mother     Arthritis Mother     Hypertension Father        Network engineer Insurance: Payor: MEDICARE /  Plan: MEDICARE PART A AND PART B / Product Type: *No Product type* /    Secondary Insurance: Secondary Insurance  MEDICAID [place]   Prescription Coverage: Nurse, learning disability (listed above)   Preferred Pharmacy: [facility] - [place], [place] - [address]  [facility]  [facility]    Barriers to taking medication: No    Transition Home   Transportation at time of discharge: Family/Friend's Private Vehicle    Anticipated changes related to Illness:  TBD   Services in place prior to admission: N/A   Services anticipated for DC:  TBD   Hemodialysis Prior to Admission: No    Readmission  Risk of Unplanned Readmission Score: UNPLANNED READMISSION SCORE: 37.79%  Readmitted Within the Last 30 Days? Yes  Readmission Factors include: unable to assess    Social Determinants of Health  Social Determinants of Health     Financial Resource Strain: Low Risk  (09/06/2022)    Overall Financial Resource Strain (CARDIA)     Difficulty of Paying Living Expenses: Not hard at all   Internet Connectivity: Not on file   Food Insecurity: No Food Insecurity (09/06/2022)    Hunger Vital Sign     Worried About Running Out of Food in the Last Year: Never true     Ran Out of Food in the Last Year: Never true   Tobacco Use: Low Risk  (09/27/2022)    Patient History     Smoking Tobacco Use: Never     Smokeless Tobacco Use: Never     Passive Exposure: Not on file   Housing/Utilities: Low Risk  (09/06/2022)    Housing/Utilities     Within the past 12 months, have you ever stayed: outside, in a car, in a tent, in an overnight shelter, or temporarily in someone else's home (i.e. couch-surfing)?: No     Are you worried about losing your housing?: No     Within the past 12 months, have you been unable to get utilities (heat, electricity) when it was really needed?: No   Alcohol Use: Not At Risk (09/05/2018)    [facility], [facility]    AUDIT-C     Frequency of Alcohol Consumption: Never     Average Number of Drinks: Not on file     Frequency of Binge Drinking: Not on file   Transportation Needs: No Transportation Needs (09/06/2022)    PRAPARE - Transportation     Lack of Transportation (Medical): No     Lack of Transportation (Non-Medical): No   Substance Use: Not on file   Health Literacy: Not on file   Physical Activity: Not on file   Interpersonal Safety: Unknown (09/28/2022)    Interpersonal Safety     Unsafe Where You Currently Live: Not on file     Physically Hurt by Anyone: Not on file     Abused by Anyone: Not on file   Stress: Not on file   Intimate Partner Violence: Patient Declined (03/05/2022)    Received from [facility], [facility], Afraid, Rape, and Kick questionnaire     Fear of Current or Ex-Partner: Patient declined     Emotionally Abused: Patient declined     Physically Abused: Patient declined     Sexually Abused: Patient declined   Depression: Not at risk (11/08/2021)    Received from [facility], [facility]  [facility]    PHQ-2     Depression Risk: 0   Social Connections: Unknown (07/07/2021)    Received from [facility]    Social Network Social Network: Not on file       Social History  Support Systems/Concerns: Barrister's clerk Service: No Field seismologist Affecting Healthcare: none    Medical and Psychiatric History  Psychosocial Stressors: Coping with health challenges/recent hospitalization      Psychological Issues/Information: No issues              Chemical Dependency: None                     Legal: No legal issues      Ability to [employer]: No issues accessing community services    Checked in with patient at bedside in ER with Cranston Neighbor, RN, TNC. Patient feels he was overall doing well recovering under his mom's care in [place], [place], until before this admission when he began having pain/nausea. He expressed wanting to feel better and to be able to go home (notes stabbing abdominal pain and continued nausea). He was amenable to trying to eat, wanting oatmeal, and we brought him menu to encourage him to order. Offered encouragement/support and CM will continue to follow for discharge needs.

## 2022-09-29 DIAGNOSIS — Z940 Kidney transplant status: Principal | ICD-10-CM

## 2022-09-29 LAB — COMPREHENSIVE METABOLIC PANEL
ALBUMIN: 2.9 g/dL — ABNORMAL LOW (ref 3.4–5.0)
ALKALINE PHOSPHATASE: 329 U/L — ABNORMAL HIGH (ref 46–116)
ALT (SGPT): <7 U/L — ABNORMAL LOW (ref 10–49)
ANION GAP: 4 mmol/L — ABNORMAL LOW (ref 5–14)
AST (SGOT): 10 U/L (ref <=34)
BILIRUBIN TOTAL: 0.5 mg/dL (ref 0.3–1.2)
BLOOD UREA NITROGEN: 13 mg/dL (ref 9–23)
BUN / CREAT RATIO: 7
CALCIUM: 10.8 mg/dL — ABNORMAL HIGH (ref 8.7–10.4)
CHLORIDE: 112 mmol/L — ABNORMAL HIGH (ref 98–107)
CO2: 18 mmol/L — ABNORMAL LOW (ref 20.0–31.0)
CREATININE: 2 mg/dL — ABNORMAL HIGH
EGFR CKD-EPI (2021) MALE: 43 mL/min/{1.73_m2} — ABNORMAL LOW (ref >=60)
GLUCOSE RANDOM: 101 mg/dL (ref 70–179)
POTASSIUM: 4.9 mmol/L — ABNORMAL HIGH (ref 3.4–4.8)
PROTEIN TOTAL: 6.1 g/dL (ref 5.7–8.2)
SODIUM: 134 mmol/L — ABNORMAL LOW (ref 135–145)

## 2022-09-29 LAB — CBC
HEMATOCRIT: 31.2 % — ABNORMAL LOW (ref 39.0–48.0)
HEMOGLOBIN: 10.2 g/dL — ABNORMAL LOW (ref 12.9–16.5)
MEAN CORPUSCULAR HEMOGLOBIN CONC: 32.7 g/dL (ref 32.0–36.0)
MEAN CORPUSCULAR HEMOGLOBIN: 30.4 pg (ref 25.9–32.4)
MEAN CORPUSCULAR VOLUME: 93.1 fL (ref 77.6–95.7)
MEAN PLATELET VOLUME: 7.9 fL (ref 6.8–10.7)
PLATELET COUNT: 135 10*9/L — ABNORMAL LOW (ref 150–450)
RED BLOOD CELL COUNT: 3.35 10*12/L — ABNORMAL LOW (ref 4.26–5.60)
RED CELL DISTRIBUTION WIDTH: 16.9 % — ABNORMAL HIGH (ref 12.2–15.2)
WBC ADJUSTED: 5.4 10*9/L (ref 3.6–11.2)

## 2022-09-29 LAB — MAGNESIUM: MAGNESIUM: 1.9 mg/dL (ref 1.6–2.6)

## 2022-09-29 LAB — PHOSPHORUS: PHOSPHORUS: 3.6 mg/dL (ref 2.4–5.1)

## 2022-09-29 MED ORDER — TACROLIMUS 1 MG CAPSULE, IMMEDIATE-RELEASE
ORAL_CAPSULE | Freq: Two times a day (BID) | ORAL | 11 refills | 30 days | Status: CP
Start: 2022-09-29 — End: 2023-09-29

## 2022-09-29 MED ORDER — OXYCODONE 5 MG TABLET
ORAL_TABLET | ORAL | 0 refills | 3 days | Status: CP | PRN
Start: 2022-09-29 — End: 2022-10-02
  Filled 2022-09-29: qty 18, 3d supply, fill #0

## 2022-09-29 MED ORDER — PROCHLORPERAZINE MALEATE 5 MG TABLET
ORAL_TABLET | Freq: Three times a day (TID) | ORAL | 0 refills | 10 days | Status: CP | PRN
Start: 2022-09-29 — End: ?
  Filled 2022-09-29: qty 30, 10d supply, fill #0

## 2022-09-29 MED ORDER — VALGANCICLOVIR 450 MG TABLET
ORAL_TABLET | Freq: Every day | ORAL | 2 refills | 30 days | Status: CP
Start: 2022-09-29 — End: 2022-12-29

## 2022-09-29 MED ADMIN — magnesium sulfate 2gm/50mL IVPB: 2 g | INTRAVENOUS | @ 01:00:00 | Stop: 2022-09-28

## 2022-09-29 MED ADMIN — bictegrav-emtricit-tenofov ala (BIKTARVY) 50-200-25 mg tablet 1 tablet: 1 | ORAL | @ 15:00:00 | Stop: 2022-09-29

## 2022-09-29 MED ADMIN — calamine-zinc oxide 8-8 % lotion: TOPICAL | @ 01:00:00

## 2022-09-29 MED ADMIN — pantoprazole (Protonix) EC tablet 40 mg: 40 mg | ORAL | @ 15:00:00 | Stop: 2022-09-29

## 2022-09-29 MED ADMIN — tacrolimus (PROGRAF) capsule 11 mg: 11 mg | ORAL | @ 09:00:00 | Stop: 2022-09-29

## 2022-09-29 MED ADMIN — gabapentin (NEURONTIN) capsule 100 mg: 100 mg | ORAL | @ 01:00:00

## 2022-09-29 MED ADMIN — azathioprine (IMURAN) tablet 150 mg: 150 mg | ORAL | @ 15:00:00 | Stop: 2022-09-29

## 2022-09-29 MED ADMIN — oxyCODONE (ROXICODONE) immediate release tablet 5 mg: 5 mg | ORAL | @ 19:00:00 | Stop: 2022-09-29

## 2022-09-29 MED ADMIN — lactated ringers bolus 1,000 mL: 1000 mL | INTRAVENOUS | @ 15:00:00 | Stop: 2022-09-29

## 2022-09-29 MED ADMIN — pantoprazole (Protonix) EC tablet 40 mg: 40 mg | ORAL | @ 01:00:00

## 2022-09-29 MED ADMIN — carvedilol (COREG) tablet 50 mg: 50 mg | ORAL | @ 15:00:00 | Stop: 2022-09-29

## 2022-09-29 MED ADMIN — minoxidil (LONITEN) tablet 5 mg: 5 mg | ORAL | @ 15:00:00 | Stop: 2022-09-29

## 2022-09-29 MED ADMIN — sodium chloride (NS) 0.9 % infusion: 75 mL/h | INTRAVENOUS | @ 01:00:00

## 2022-09-29 MED ADMIN — carvedilol (COREG) tablet 50 mg: 50 mg | ORAL | @ 01:00:00

## 2022-09-29 MED ADMIN — sodium phosphate 30 mmol in dextrose 5 % 250 mL IVPB: 30 mmol | INTRAVENOUS | @ 06:00:00 | Stop: 2022-09-29

## 2022-09-29 MED ADMIN — magnesium sulfate 2gm/50mL IVPB: 2 g | INTRAVENOUS | @ 04:00:00

## 2022-09-29 MED ADMIN — diphenhydrAMINE (BENADRYL) capsule/tablet 25 mg: 25 mg | ORAL | @ 09:00:00 | Stop: 2022-09-29

## 2022-09-29 MED ADMIN — minoxidil (LONITEN) tablet 5 mg: 5 mg | ORAL | @ 01:00:00

## 2022-09-29 MED ADMIN — predniSONE (DELTASONE) tablet 5 mg: 5 mg | ORAL | @ 15:00:00 | Stop: 2022-09-29

## 2022-09-29 MED ADMIN — sodium bicarbonate tablet 650 mg: 650 mg | ORAL | @ 18:00:00 | Stop: 2022-09-29

## 2022-09-29 MED ADMIN — albuterol (PROVENTIL HFA;VENTOLIN HFA) 90 mcg/actuation inhaler 1-2 puff: 1-2 | RESPIRATORY_TRACT | @ 01:00:00

## 2022-09-29 MED ADMIN — methocarbamol (ROBAXIN) tablet 500 mg: 500 mg | ORAL | @ 01:00:00

## 2022-09-29 MED ADMIN — diphenhydrAMINE (BENADRYL) capsule/tablet 25 mg: 25 mg | ORAL | @ 15:00:00 | Stop: 2022-09-29

## 2022-09-29 NOTE — Unmapped (Addendum)
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   He appears to be doing well. States he is back to 90% except for his leg swelling.    Will change his epivir to 150 mg daily when he completes his liquid rx. Will see him back in 1 month and repeat his labs th    Acute deep vein thrombosis (DVT) of popliteal vein of left lower extremity (CMS-HCC) 02/05/2019    Last Assessment & Plan:   Formatting of this note might be different from the original.  Moderate effusion left knee new the some limitation of motion.  No evidence of sepsis the white count the normal  will     Proceed on with an aspiration for cell count, culture and crystal analysis.  New x-rays ordered and currently pending will follow.  Further therapeutic recommendations contingent on the re    Anemia     Asthma     Candidal esophagitis (CMS-HCC) 07/30/2022    Community acquired pneumonia 07/30/2014    DVT (deep venous thrombosis) (CMS-HCC) 11/24/2013    Last Assessment & Plan:    Formatting of this note might be different from the original.   Appears to be doing better (swelling improved). He will f/u with PCP regarding coumadin dosing (INR is low).  Formatting of this note might be different from the original.   Last Assessment & Plan:    Formatting of this note might be different from the original.   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   greatly appreciate renal f/u.  Formatting of this note might be different from the original.   Last Assessment & Plan:    greatly appreciate renal f/u.   Last Assessment & Plan:    greatly appreciate renal f/u.  Formatting of this note might be different from the original.   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 Plan to do pre-emptive monitoring with weekly CMV PCRs. Discussed this with ID and nephrology.   End date:  PJP:  Dapsone to start 8/8 (received pentamidine x1 during initial admission); duration for 1 year    Medication changes to be instituted upon discharge:  Pertinent new medications:  Prochlorperazine PRN (pt request)    Discontinued Zofran PRN    Other: Patient has been refusing sodium bicarbonate and Valcyte in particular. Med card updated by Denny Peon, nurse coordinator, to space out medications throughout the day based on conversation with patient preference. Would follow-up outpatient if patient is more adherent with his medications.    Vertis Kelch,  PharmD

## 2022-09-29 NOTE — Unmapped (Signed)
Patient arrived to floor from ER at 1435. Per report, patient refusing lab draws, blood sugar checks, and some medications. Whitman Hero, MD, aware. Magnesium replacement ordered and patient refusing. Patient agreeable to get potassium drawn this pm. Glucose level added onto labwork. Patient given oxycodone for pain and compazine for nausea. Patient sleeping this shift. Dressing to right lower abdomen reinforced.       Problem: Adult Inpatient Plan of Care  Goal: Plan of Care Review  Outcome: Progressing  Goal: Absence of Hospital-Acquired Illness or Injury  Outcome: Progressing  Intervention: Identify and Manage Fall Risk  Recent Flowsheet Documentation  Taken 09/28/2022 1811 by Lattie Haw, RN  Safety Interventions:   low bed   nonskid shoes/slippers when out of bed   room near unit station  Taken 09/28/2022 1600 by Lattie Haw, RN  Safety Interventions:   lighting adjusted for tasks/safety   low bed   nonskid shoes/slippers when out of bed   room near unit station  Intervention: Prevent Skin Injury  Recent Flowsheet Documentation  Taken 09/28/2022 1811 by Lattie Haw, RN  Positioning for Skin: Supine/Back  Skin Protection: adhesive use limited  Taken 09/28/2022 1600 by Lattie Haw, RN  Positioning for Skin: Supine/Back  Device Skin Pressure Protection: tubing/devices free from skin contact  Skin Protection: adhesive use limited     Problem: Wound  Goal: Absence of Infection Signs and Symptoms  Outcome: Progressing  Goal: Optimal Pain Control and Function  Outcome: Progressing  Goal: Optimal Wound Healing  Outcome: Progressing

## 2022-09-29 NOTE — Unmapped (Signed)
Transplant Surgery Progress Note    Hospital Day: 3       Assessment:     Christian Brown  a 42 y.o. 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 He presented to the ED On 09/27/22 with incisional pain and drainage. No fevers or chills at home, no leukocytosis. CT AP with small superficial fluid collection. 3 staples removed from the inferior aspect of the wound and probed with ~5 mL of serosanguinous output expressed, and packed with plain packing strip. Due to nausea, the patient will be admitted for obs to ensure ability to take immunosuppressive medication and workup cause of nausea.     Interval Events:     NAEO. Patient had improved pain with oral oxycodone. Patient HDS (SBP 100s-130s), making urine (550 ml) and BMx2. Hyperkalemia improved from yesterday (5.3 to 4.9). EKG final read still pending. UA unremarkable given history of stent, Ucx pending. Afebrile with normal WBC. Patient safe for discharge and followup in clinic next week. Can use dry gauze over wound site.     Plan:     Neuro:   - On gabapentin, 5-10 oxy prn for pain    CV:   - HDS, Maintain SBP < 180  - 7/31 EKG read pending, qtc 241    Pulm:   - Stable on room air  Continue incentive spirometry, pulmonary toilet, out of bed as tolerated    GI:   - F: 0.9% NS at 50mL/hr,    - E: Replete as needed   - N: regular diet   - pepcid/pantoprazole, zofran, colace, miralax   - 7/31 CT AP, rads impression    1. Status post right lower quadrant transplant kidney without significant hydronephrosis. Nonspecific 2.2 cm subcutaneous collection along the inferior margin of the soft tissue staples. Clinical correlation is recommended to exclude infection.    Bun/Cr 13/2.00 (12/1.70)   - given 1LR bolus    GU:   - No foley    Endo:   -glucose checks ACHS, pt refusing    Heme/ID:   - Afebrile, WBC and Hgb stable   - ppx with nystatin, valcyte, bactrim  - 7/31 Urine Cx - pending  - 7/31 UA small LE, 6 WBC, 44 RBC  - 7/31 COVID 19 Negative    WBC 5.4  Hb 10.2    Immuno:   - tac, biktarvy, steroids  - pharmacy recommendations for dosing    Dispo   - Floor      Objective:        Vital Signs:  BP 115/63  - Pulse 84  - Temp 36.9 ??C (98.4 ??F) (Oral)  - Resp 18  - Ht 170.2 cm (5' 7)  - Wt 73.7 kg (162 lb 7.7 oz)  - SpO2 100%  - BMI 25.45 kg/m??     Input/Output:  I/O last 3 completed shifts:  In: -   Out: 1550 [Urine:1550]    Physical Exam:    General: Cooperative, no distress, well appearing male  Pulmonary: Normal work of breathing, on room air  Cardiovascular: Regular rate,   Abdomen: Soft, non-tender, non-distended. Draining surgical site.    Musculoskeletal: Moves extremities spontaneously   Neurologic: Alert and interactive, grossly intact    Labs:  Lab Results   Component Value Date    WBC 8.6 09/28/2022    HGB 11.5 (L) 09/28/2022    HCT 34.3 (L) 09/28/2022    PLT 159 09/28/2022  Lab Results   Component Value Date    NA 134 (L) 09/28/2022    NA 134 (L) 09/28/2022    K 5.8 (H) 09/28/2022    CL 113 (H) 09/28/2022    CL 113 (H) 09/28/2022    CO2 16.0 (L) 09/28/2022    CO2 16.0 (L) 09/28/2022    BUN 12 09/28/2022    BUN 12 09/28/2022    CREATININE 1.70 (H) 09/28/2022    CREATININE 1.70 (H) 09/28/2022    CALCIUM 11.5 (H) 09/28/2022    CALCIUM 11.5 (H) 09/28/2022    MG 1.3 (L) 09/28/2022    MG 1.3 (L) 09/28/2022    PHOS 2.0 (L) 09/28/2022    PHOS 1.9 (L) 09/28/2022       Microbiology Results (last day)       Procedure Component Value Date/Time Date/Time    Urine Culture [[id number]]  (Normal) Collected: 09/27/22 1322    Lab Status: Preliminary result Specimen: Urine from Clean Catch Updated: 09/28/22 1115     Urine Culture, Comprehensive TOO YOUNG TO READ    Narrative:      Specimen Source: Clean Catch            Imaging:  All pertinent imaging personally reviewed.

## 2022-09-29 NOTE — Unmapped (Signed)
Transplant Nephrology Consult     Requesting provider: Katherine Roan, MD  Service requesting consult: SRF  Reason for consult: Co-management of renal transplant patient      Assessment/Recommendations: Christian Brown is a 42 y.o. 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    # Kidney allograft function: (stable)  Lab Results   Component Value Date    CREATININE 1.70 (H) 09/28/2022    CREATININE 1.70 (H) 09/28/2022     - His graft function is stable/improved from previous discharge.    # Immunosuppression [High risk medical decision making for drug therapy requiring intensive monitoring for toxicity]  - Induction: Thymoglobulin.  - Maintenance immunosuppression: Tacrolimus/azathioprine (MPA deferred due to GI intolerance)/prednisone  - Please obtain trough levels prior to the morning dose of the medication. The tacrolimus goal level is 8-10 ng/mL.    # Nausea and abdominal pain  - Agree with symptomatic management of nausea.  - Amylase and lipase added to labs, can see pancreatitis with azathioprine.  - CT abd/pelvis without contrast is reassuring.    # Wound drainage  - Some staples removed by SRF, defer management to them.    # BP management:  - Blood pressure reasonable on Coreg and minoxidil.  - Multiple medication allergies, including to antihypertensives.    # Hypercalcemia  - Likely from secondary hyperparathyroidism.  - Allergies to both cinacalcet and calcitonin.  - Agree with IVF as you are.    # Hyperkalemia  - Agree with Lokelma.  - Would not give IV calcium unless he has EKG changes, given existing hypercalcemia.    # Infectious Prophylaxis and Monitoring:   - CMV: D-/R+ EBV: D+/R+  - Dapsone 100 mg daily starting 10/05/22.  - Valcyte x 3 months.      **Transplant patients with an open wound require wound care with sterile water only. The patient should be counseled on this at the time of discharge if they have not already been doing this.    Recommendations communicated to the primary team.      Drinda Butts, MD  Division of Nephrology and Hypertension  [facility]  09/28/2022  7:12 PM      _____________________________________________________________________________________    Kidney Transplant History:   Date of Transplant: 09/05/2022 (Kidney)  Type of Transplant: Deceased  KDPI: 18%  Cold ischemic time: 972 minutes   Warm ischemic time: 40 minutes  cPRA: 7%  Blood type: Donor O, Recipient O POS  ID: CMV: D-/R+ EBV: D+/R+   Zero-Hour Biopsy: Not done  Native Kidney Disease: cFSGS/HIVAN (biopsy proven)  Pre-transplant dialysis course: Was on hemodialysis since 02/11/2015  Prior Transplants: None    Post-Transplant Course:   Delayed graft function requiring dialysis: No  Other complications: Prolonged initial transplant hospitalization from 7/9-7/25/24. Had pulmonary reaction to pentamidine requiring albuterol, transferred to SICU for nicardipine gtt due to uncontrolled hypertension in the setting of declining oral BP meds. Significant GERD with EGD done 09/12/22 that showed gastritis and esophagitis, thought to be medication related. Improved with adjustment of medications.  Induction: Thymoglobulin  Early steroid withdrawal: No, on prednisone 5 mg daily  Rejection Episodes: None to date        History of Present Illness: Christian Brown is a/an 42 y.o. 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    He states that following discharge from his initial transplant hospitalization, he was doing well without any abdominal pain.  About 2 days ago, he developed diffuse abdominal pain.  He has not been able to eat or drink normally.  He did not take any of his immunosuppression yesterday, due to concerns that he would vomit it immediately.  He thinks he was prescribed Zofran at some point, but he does not have any available at home.  He had a normal bowel movement today, no diarrhea.  No fever.  No respiratory symptoms.  He has not tried to eat today, but just ordered some oatmeal and is going to see how that goes. He has also had some drainage from his incision and associated discomfort with that.      Medications:   Current Facility-Administered Medications   Medication Dose Route Frequency Provider Last Rate Last Admin    acetaminophen (TYLENOL) tablet 650 mg  650 mg Oral Q6H PRN Ermelinda Das, Aidin, MD        albuterol (PROVENTIL HFA;VENTOLIN HFA) 90 mcg/actuation inhaler 1-2 puff  1-2 puff Inhalation Q6H PRN Ermelinda Das, Aidin, MD        azathioprine (IMURAN) tablet 150 mg  150 mg Oral Daily Ermelinda Das, Aidin, MD   150 mg at 09/28/22 0203    bictegrav-emtricit-tenofov ala (BIKTARVY) 50-200-25 mg tablet 1 tablet  1 tablet Oral Q AM Ermelinda Das, Aidin, MD   1 tablet at 09/28/22 0813    calamine-zinc oxide 8-8 % lotion   Topical QID Ermelinda Das, Aidin, MD   Given at 09/28/22 1113    carvedilol (COREG) tablet 50 mg  50 mg Oral BID Ermelinda Das, Aidin, MD   50 mg at 09/28/22 0818    diphenhydrAMINE (BENADRYL) capsule/tablet 25 mg  25 mg Oral Q6H PRN Ermelinda Das, Aidin, MD   25 mg at 09/28/22 0200    docusate sodium (COLACE) capsule 100 mg  100 mg Oral BID PRN Ermelinda Das, Aidin, MD        gabapentin (NEURONTIN) capsule 100 mg  100 mg Oral TID Ermelinda Das, Aidin, MD        glucose chewable tablet 16 g  16 g Oral Continuous PRN Gross, Tanna Savoy, MD        magnesium oxide (MAG-OX) tablet 400 mg  400 mg Oral BID Gross, Tanna Savoy, MD        methocarbamol (ROBAXIN) tablet 500 mg  500 mg Oral QID Ermelinda Das, Aidin, MD   500 mg at 09/28/22 0814    minoxidil (LONITEN) tablet 5 mg  5 mg Oral BID Ermelinda Das, Aidin, MD   5 mg at 09/28/22 0201    ondansetron (ZOFRAN-ODT) disintegrating tablet 4 mg  4 mg Oral Q8H PRN Ermelinda Das, Aidin, MD   4 mg at 09/27/22 2131    oxyCODONE (ROXICODONE) immediate release tablet 5 mg  5 mg Oral Q4H PRN Ermelinda Das, Aidin, MD   5 mg at 09/28/22 1533    Or    oxyCODONE (ROXICODONE) immediate release tablet 10 mg  10 mg Oral Q4H PRN Ermelinda Das, Aidin, MD        pantoprazole (Protonix) EC tablet 40 mg  40 mg Oral BID Ermelinda Das, Aidin, MD   40 mg at 09/28/22 0818    predniSONE (DELTASONE) tablet 5 mg  5 mg Oral Daily Ermelinda Das, Aidin, MD        prochlorperazine (COMPAZINE) injection 2.5 mg  2.5 mg Intravenous Q6H PRN Ermelinda Das, Aidin, MD   2.5 mg at 09/28/22 1641    sodium bicarbonate tablet 650 mg  650 mg Oral BID Ermelinda Das, Aidin, MD        sodium chloride (NS) 0.9 % infusion  75 mL/hr Intravenous Continuous Ermelinda Das, Aidin, MD 75 mL/hr at 09/28/22 1005 75 mL/hr at 09/28/22 1005    tacrolimus (PROGRAF) capsule 11 mg  11 mg Oral BID Mathis Dad, MD   11 mg at 09/28/22 1835    valGANciclovir (VALCYTE) tablet 450 mg  450 mg Oral Daily Ermelinda Das, Aidin, MD            ALLERGIES  Amlodipine, Doxercalciferol, Fish containing products, Iron sucrose, Labetalol, Metoclopramide, Metoprolol, Nifedipine, Other, Sulfamethoxazole-trimethoprim, Vancomycin, Calcitonin, Peanut oil, Potassium clavulanate, Clindamycin, Hydralazine, and Sensipar [cinacalcet]    MEDICAL HISTORY  Past Medical History:   Diagnosis Date    Acquired immunodeficiency syndrome (CMS-HCC) 11/14/2013    Formatting of this note might be different from the original.   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   He appears to be doing well. States he is back to 90% except for his leg swelling.    Will change his epivir to 150 mg daily when he completes his liquid rx. Will see him back in 1 month and repeat his labs th    Acute deep vein thrombosis (DVT) of popliteal vein of left lower extremity (CMS-HCC) 02/05/2019    Last Assessment & Plan:   Formatting of this note might be different from the original.  Moderate effusion left knee new the some limitation of motion.  No evidence of sepsis the white count the normal  will     Proceed on with an aspiration for cell count, culture and crystal analysis.  New x-rays ordered and currently pending will follow.  Further therapeutic recommendations contingent on the re    Anemia     Asthma     Candidal esophagitis (CMS-HCC) 07/30/2022    Community acquired pneumonia 07/30/2014    DVT (deep venous thrombosis) (CMS-HCC) 11/24/2013    Last Assessment & Plan:    Formatting of this note might be different from the original.   Appears to be doing better (swelling improved). He will f/u with PCP regarding coumadin dosing (INR is low).  Formatting of this note might be different from the original.   Last Assessment & Plan:    Formatting of this note might be different from the original.   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   greatly appreciate renal f/u.  Formatting of this note might be different from the original.   Last Assessment & Plan:    greatly appreciate renal f/u.   Last Assessment & Plan:    greatly appreciate renal f/u.  Formatting of this note might be different from the original.   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    Rectal bleeding 04/23/2019    Sleep apnea         SOCIAL HISTORY  Social History     Socioeconomic History    Marital status: Single   Tobacco Use    Smoking status: Never    Smokeless tobacco: Never   Vaping Use    Vaping status: Never Used   Substance and Sexual Activity    Alcohol use: Not Currently    Drug use: Never    Sexual activity: Not Currently     Partners: Female     Birth control/protection: Condom     Social Determinants of Health     Financial Resource Strain: Low Risk  (09/06/2022)    Overall Financial Resource Strain (CARDIA)     Difficulty of Paying Living Expenses: Not hard at all   Food Insecurity: No Food Insecurity (09/06/2022)    Hunger Vital Sign     Worried About Running Out of Food in the Last Year: Never Christian Brown     Ran Out of Food in the Last Year: Never Christian Brown   Transportation Needs: No Transportation Needs (09/06/2022)    PRAPARE - Therapist, art (Medical): No     Lack of Transportation (Non-Medical): No    [facility]    Social Network        FAMILY HISTORY  Family History   Problem Relation Age of Onset    Hypertension Mother     Arthritis Mother     Hypertension Father           Review of Systems:  As per HPI, all other systems reviewed and negative.    Physical Exam:  Vitals:    09/28/22 1437   BP: 129/73   Pulse: 85   Resp: 19   Temp: 37 ??C (98.6 ??F)   SpO2: 100%     No intake/output data recorded.    Intake/Output Summary (Last 24 hours) at 09/28/2022 1912  Last data filed at 09/28/2022 1830  Gross per 24 hour   Intake --   Output 1325 ml   Net -1325 ml       General: no acute distress  HEENT: anicteric sclera  CV: no peripheral edema  Lungs: normal work of breathing  Abdomen: non-distended  Skin: no visible lesions or rashes      Test Results  Reviewed  Lab Results   Component Value Date    NA 134 (L) 09/28/2022    NA 134 (L) 09/28/2022    K 5.3 (H) 09/28/2022    CL 113 (H) 09/28/2022    CL 113 (H) 09/28/2022    CO2 16.0 (L) 09/28/2022    CO2 16.0 (L) 09/28/2022    BUN 12 09/28/2022    BUN 12 09/28/2022    CREATININE 1.70 (H) 09/28/2022    CREATININE 1.70 (H) 09/28/2022    GLU 104 09/28/2022    CALCIUM 11.5 (H) 09/28/2022    CALCIUM 11.5 (H) 09/28/2022    ALBUMIN 3.1 (L) 09/28/2022    PHOS 2.0 (L) 09/28/2022    PHOS 1.9 (L) 09/28/2022

## 2022-09-29 NOTE — Unmapped (Signed)
Transplant Nephrology Consult     Requesting provider: Katherine Roan, MD  Service requesting consult: SRF  Reason for consult: Co-management of renal transplant patient      Assessment/Recommendations: Christian Brown is a 42 y.o. 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    # Kidney allograft function: (stable)  Lab Results   Component Value Date    CREATININE 2.00 (H) 09/29/2022     - Creatinine up a little from yesterday, still within most recent baseline. Will get labs early next week.    # Immunosuppression [High risk medical decision making for drug therapy requiring intensive monitoring for toxicity]  - Induction: Thymoglobulin.  - Maintenance immunosuppression: Tacrolimus/azathioprine (MPA deferred due to GI intolerance)/prednisone  - Please obtain trough levels prior to the morning dose of the medication. The tacrolimus goal level is 8-10 ng/mL.    # Nausea and abdominal pain  - Workup unremarkable, symptoms somewhat improved.  - Would recommend discharging with Zofran.    # Wound drainage  - Some staples removed by SRF, defer management to them.    # BP management:  - Blood pressure much better, continue Coreg and minoxidil.  - Multiple medication allergies, including to antihypertensives.    # Hypercalcemia  - Likely from secondary hyperparathyroidism.  - Allergies to both cinacalcet and calcitonin.  - Ca down to 10.8 with IVF.    # Hyperkalemia  - Down to 4.9 this am.    # Infectious Prophylaxis and Monitoring:   - CMV: D-/R+ EBV: D+/R+  - Dapsone 100 mg daily starting 10/05/22.  - He would like to defer Valcyte for now and do monitoring instead of prophylaxis. Will hold and check weekly CMV PCRs.      **Transplant patients with an open wound require wound care with sterile water only. The patient should be counseled on this at the time of discharge if they have not already been doing this.    Recommendations communicated to the primary team.      Drinda Butts, MD  Division of Nephrology and Hypertension  [facility]  09/29/2022  1:35 PM      _____________________________________________________________________________________    Kidney Transplant History:   Date of Transplant: 09/05/2022 (Kidney)  Type of Transplant: Deceased  KDPI: 18%  Cold ischemic time: 972 minutes   Warm ischemic time: 40 minutes  cPRA: 7%  Blood type: Donor O, Recipient O POS  ID: CMV: D-/R+ EBV: D+/R+   Zero-Hour Biopsy: Not done  Native Kidney Disease: cFSGS/HIVAN (biopsy proven)  Pre-transplant dialysis course: Was on hemodialysis since 02/11/2015  Prior Transplants: None    Post-Transplant Course:   Delayed graft function requiring dialysis: No  Other complications: Prolonged initial transplant hospitalization from 7/9-7/25/24. Had pulmonary reaction to pentamidine requiring albuterol, transferred to SICU for nicardipine gtt due to uncontrolled hypertension in the setting of declining oral BP meds. Significant GERD with EGD done 09/12/22 that showed gastritis and esophagitis, thought to be medication related. Improved with adjustment of medications.  Induction: Thymoglobulin  Early steroid withdrawal: No, on prednisone 5 mg daily  Rejection Episodes: None to date        Subjective/Interval events:  Feeling a little better this am, did eat some oatmeal for breakfast. Still having some abdominal pain.        Review of Systems:  As per HPI, all other systems  reviewed and negative.    Physical Exam:  Vitals:    09/29/22 1035   BP: 122/73   Pulse: 88   Resp: 18   Temp: 37.2 ??C (98.9 ??F)   SpO2: 100%     I/O this shift:  In: -   Out: 825 [Urine:825]    Intake/Output Summary (Last 24 hours) at 09/29/2022 1335  Last data filed at 09/29/2022 1315  Gross per 24 hour   Intake --   Output 1375 ml   Net -1375 ml       General: no acute distress  HEENT: anicteric sclera  CV: no peripheral edema  Lungs: normal work of breathing  Abdomen: non-distended  Skin: no visible lesions or rashes      Test Results  Reviewed  Lab Results   Component Value Date    NA 134 (L) 09/29/2022    K 4.9 (H) 09/29/2022    CL 112 (H) 09/29/2022    CO2 18.0 (L) 09/29/2022    BUN 13 09/29/2022    CREATININE 2.00 (H) 09/29/2022    GLU 101 09/29/2022    CALCIUM 10.8 (H) 09/29/2022    ALBUMIN 2.9 (L) 09/29/2022    PHOS 3.6 09/29/2022

## 2022-09-29 NOTE — Unmapped (Signed)
Transplant Surgery Progress Note    Hospital Day: 3    HPI:  Christian Brown is seen in consultation for post-op incisional pain and drainage at the request of Sabino Gasser, MD on the Emergency Medicine service.      Patient is a 42 y.o. 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 He presents to the ED On 09/27/22 with incisional pain and drainage.     Patient states he has been having drainage from the inferior aspect of the incision that he describes as mostly serous. He developed nausea over the last 2 days and has not taken his medications last night and this morning. He states that the symptoms feel different than his previous esophagitis episode. Endorses good UOP at home.     Assessment:     Christian Brown  a 42 y.o. 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 He presented to the ED On 09/27/22 with incisional pain and drainage. No fevers or chills at home, no leukocytosis. CT AP with small superficial fluid collection. 3 staples removed from the inferior aspect of the wound and probed with ~5 mL of serosanguinous output expressed, and packed with plain packing strip. Due to nausea, the patient will be admitted for obs to ensure ability to take immunosuppressive medication and workup cause of nausea.     Interval Events:     NAEO. Patient reporting some pain (9/10) which improved with oral oxycodone. Patient HDS (SBP 130s-150s), satting well on room air and making urine (1000 ml). Patient did have high potassium (5.3) and was given insulin. Patient refused POC glucose checks. EKG final read still pending. UA did show small amount of LE, and WBC (6) but patient is afebrile and WBC is 6.0, pending Ucx. Patient transferred to floor.        Plan:     Neuro:   - On gabapentin, 5-10 oxy prn for pain    CV:   - HDS, Maintain SBP < 180  - 7/31 EKG read pending, qtc 241    Pulm:   - Stable on room air  Continue incentive spirometry, pulmonary toilet, out of bed as tolerated    GI:   - F: 0.9% NS at 77mL/hr   - E: Replete as needed   - N: regular diet   - pepcid/pantoprazole, zofran, colace, miralax   - 7/31 CT AP, rads impression    1. Status post right lower quadrant transplant kidney without significant hydronephrosis. Nonspecific 2.2 cm subcutaneous collection along the inferior margin of the soft tissue staples. Clinical correlation is recommended to exclude infection.    Na 138  K 5.9  - Given insulin, loklema, repeat BMP  Bun/Cr 14/1.84 (13-16/1.96-2.0)  Mg 1.5  AST/ALT 19/7  ALP 348      GU:   - No foley    Endo:   -glucose checks ACHS, pt refusing    Heme/ID:   - Afebrile, WBC and Hgb stable   - ppx with nystatin, valcyte, bactrim  - 7/31 Urine Cx - pending  - 7/31 UA small LE, 6 WBC, 44 RBC  - 7/31 COVID 19 Negative    WBC 6.0  Hb 10.7  Plt 161    Immuno:   - tac, biktarvy, steroids  - pharmacy recommendations for dosing    Dispo   - Floor      Objective:        Vital Signs:  BP  115/63  - Pulse 84  - Temp 36.9 ??C (98.4 ??F) (Oral)  - Resp 18  - Ht 170.2 cm (5' 7)  - Wt 73.7 kg (162 lb 7.7 oz)  - SpO2 100%  - BMI 25.45 kg/m??     Input/Output:  I/O last 3 completed shifts:  In: -   Out: 1325 [Urine:1325]    Physical Exam:    General: Cooperative, no distress, well appearing male  Pulmonary: Normal work of breathing, on room air  Cardiovascular: Regular rate,   Abdomen: Soft, non-tender, non-distended. Draining surgical site.    Musculoskeletal: Moves extremities spontaneously   Neurologic: Alert and interactive, grossly intact    Labs:  Lab Results   Component Value Date    WBC 8.6 09/28/2022    HGB 11.5 (L) 09/28/2022    HCT 34.3 (L) 09/28/2022    PLT 159 09/28/2022       Lab Results   Component Value Date    NA 134 (L) 09/28/2022    NA 134 (L) 09/28/2022    K 5.8 (H) 09/28/2022    CL 113 (H) 09/28/2022    CL 113 (H) 09/28/2022    CO2 16.0 (L) 09/28/2022    CO2 16.0 (L) 09/28/2022    BUN 12 09/28/2022    BUN 12 09/28/2022    CREATININE 1.70 (H) 09/28/2022 CREATININE 1.70 (H) 09/28/2022    CALCIUM 11.5 (H) 09/28/2022    CALCIUM 11.5 (H) 09/28/2022    MG 1.3 (L) 09/28/2022    MG 1.3 (L) 09/28/2022    PHOS 2.0 (L) 09/28/2022    PHOS 1.9 (L) 09/28/2022       Microbiology Results (last day)       Procedure Component Value Date/Time Date/Time    Urine Culture [[id number]]  (Normal) Collected: 09/27/22 1322    Lab Status: Preliminary result Specimen: Urine from Clean Catch Updated: 09/28/22 1115     Urine Culture, Comprehensive TOO YOUNG TO READ    Narrative:      Specimen Source: Clean Catch            Imaging:  All pertinent imaging personally reviewed.

## 2022-09-29 NOTE — Unmapped (Addendum)
Christian Brown is a 42 y.o. 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 He presented to the ED On 09/27/22 with incisional pain and serous drainage from the inferior aspect of the incision site. Patient had been having good UOP at home and had been taking medications. He admitted for observation and management of his symptoms.     Patient found to have mild hyperkalemia (5.8) which was treated with insulin and lokelma, which improved his potassium levels. Urine analysis and urine culture were ordered*** and were unremarkable. A CT abdomen/pelvic was unremarkable for any intraabdominal fluid collection or abscess. His wounds were packed with gauze and recommended to use dry gauze at discharge.    On day of discharge, his pain was under control and educated on proper dressing changes, as well as the need for nutritional supplementation.     Followup in clinic was scheduled for early next week.

## 2022-09-29 NOTE — Unmapped (Signed)
Discharge Summary    Admit date: 09/27/2022    Discharge date and time: 09/29/2022 at 1240p    Discharge to:  Home    Discharge Service: Surg Transplant [facility])    Discharge Attending Physician: Leona Carry, MD    Discharge  Diagnoses: Incisional pain    Secondary Diagnosis: Active Problems:    * No active hospital problems. *  Resolved Problems:    * No resolved hospital problems. *    OR Procedures:  None.     Ancillary Procedures: no procedures    Discharge Day Services:     Subjective   NAEO. Patient had improved pain with oral oxycodone. Patient HDS (SBP 100s-130s), making urine (550 ml) and BMx2. Hyperkalemia improved from yesterday (5.3 to 4.9). EKG final read still pending. UA unremarkable given history of stent, Ucx pending. Afebrile with normal WBC. Patient safe for discharge and followup in clinic next week. Can use dry gauze over wound site.     Objective   Patient Vitals for the past 8 hrs:   BP Temp Temp src Pulse Resp SpO2 Weight   09/29/22 1035 122/73 37.2 ??C (98.9 ??F) Oral 88 18 100 % --   09/29/22 0745 137/76 37 ??C (98.6 ??F) Oral 85 18 100 % --   09/29/22 0532 -- -- -- -- -- -- 73.7 kg (162 lb 7.7 oz)     I/O this shift:  In: -   Out: 425 [Urine:425]    General Appearance:    No acute distress  Lungs:                 Clear to auscultation bilaterally  Heart:                            Regular rate and rhythm  Abdomen:                 Non distended. Dressings C/D/I.  Extremities:               Warm and well perfused    Hospital Course:  Christian Brown is a 42 y.o. 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 He presented to the ED On 09/27/22 with incisional pain and serous drainage from the inferior aspect of the incision site. Patient had been having good UOP at home and had been taking medications. He admitted for observation and management of his symptoms.     Patient found to have mild hyperkalemia (5.8) which was treated with insulin and lokelma, which improved his potassium levels. Urine analysis ws unremarkable. A CT abdomen/pelvic was unremarkable for any intraabdominal fluid collection or abscess. His wounds were packed with gauze and recommended to use dry gauze at discharge.    On day of discharge, his pain was under control and educated on proper dressing changes, as well as the need for nutritional supplementation.     Followup in clinic was scheduled for early next week.      Condition at Discharge: Improved  Discharge Medications:      Medication List      START taking these medications     prochlorperazine 5 MG tablet; Commonly known as: COMPAZINE; Take 1   tablet (5 mg total) by mouth every eight (8) hours as needed for nausea.     CHANGE how you take these medications     oxyCODONE 5 MG immediate release tablet; Commonly known as:  ROXICODONE;   Take 1 tablet (5 mg total) by mouth every four (4) hours as needed; What   changed: reasons to take this   * tacrolimus 5 MG capsule; Commonly known as: PROGRAF; Take 2 capsules   (10 mg total) by mouth two (2) times a day. Take with one, 1 mg capsule,   twice a day for total of 11 mg twice daily; What changed: Another   medication with the same name was changed. Make sure you understand how   and when to take each.   * tacrolimus 1 MG capsule; Commonly known as: PROGRAF; Take 1 capsule (1   mg total) by mouth two (2) times a day. Take with 5 mg capsules (for dose   adjustments); on tac 11 mg twice daily; What changed: how much to take,   additional instructions  * This list has 2 medication(s) that are the same as other medications   prescribed for you. Read the directions carefully, and ask your doctor or   other care provider to review them with you.     CONTINUE taking these medications     ALLERGY RELIEF(DIPHENHYDRAMIN) 25 mg capsule; Generic drug:   diphenhydrAMINE; Take 1 capsule (25 mg total) by mouth every six (6) hours   as needed for itching.   azathioprine 50 mg tablet; Commonly known as: IMURAN; Take 3 tablets   (150 mg total) by mouth daily.   BIKTARVY 50-200-25 mg tablet; Generic drug: bictegrav-emtricit-tenofov   ala; Take 1 tablet by mouth every morning.   calamine-zinc oxide 8-8 % Lotn; Apply topically four (4) times a day.   carvedilol 25 MG tablet; Commonly known as: COREG; Take 2 tablets (50 mg   total) by mouth two (2) times a day.   Children's acetaminophen 160 mg/5 mL solution; Generic drug:   acetaminophen; Take 20.3 mL (650 mg total) by mouth every six (6) hours as   needed for fever or pain.   dapsone 100 MG tablet; Take 1 tablet (100 mg total) by mouth daily.   START taking on 10/05/22   docusate sodium 100 MG capsule; Commonly known as: COLACE; Take 1   capsule (100 mg total) by mouth two (2) times a day as needed for   constipation.   hydrocortisone 1 % cream; Apply topically two (2) times a day.   magnesium (amino acid chelate) 133 mg tablet; Generic drug: magnesium   oxide-Mg AA chelate; Take 1 tablet by mouth two (2) times a day. Hold   until directed to take by nurse coordinator   minoxidil 2.5 MG tablet; Commonly known as: LONITEN; Take 2 tablets (5   mg total) by mouth two (2) times a day.   naloxone 4 mg/actuation nasal spray; Commonly known as: NARCAN   pantoprazole 40 MG tablet; Commonly known as: Protonix; Take 1 tablet   (40 mg total) by mouth two (2) times a day.   predniSONE 5 MG tablet; Commonly known as: DELTASONE; Take 1 tablet (5   mg total) by mouth in the morning.   sodium bicarbonate 650 mg tablet; Take 1 tablet (650 mg total) by mouth   two (2) times a day.   valGANciclovir 450 mg tablet; Commonly known as: VALCYTE; Take 1 tablet   (450 mg total) by mouth daily.   VENTOLIN HFA 90 mcg/actuation inhaler; Generic drug: albuterol; Inhale   1-2 puffs every four (4) to six (6) hours as needed for wheezing or   shortness of breath.  STOP taking these medications     diclofenac sodium 1 % gel; Commonly known as: VOLTAREN   HYDROcodone-acetaminophen 5-325 mg per tablet; Commonly known as: NORCO   ondansetron 4 MG disintegrating tablet; Commonly known as: ZOFRAN-ODT       Pending Test Results: Urine Culture    Discharge Instructions:  Activity:  No restriction.    Diet: Regular    Other Instructions: Can use dry gauze for wound site. Ensure proper nutrition, supplement with protein shakes.    Labs and Other Follow-ups after Discharge:      Future Appointments:  Appointments which have been scheduled for you      Oct 06, 2022 10:30 AM  (Arrive by 10:15 AM)  RETURN NEPHROLOGY POST with Leafy Half, MD  [facility] KIDNEY TRANSPLANT [place] [facility] REGION) [address]  [facility] 1 through 4  [place] [address]  [phone]        Oct 06, 2022 11:20 AM  (Arrive by 11:05 AM)  Nila Nephew W MD with KIDNEY TRANSPLANT PHARMACY  [facility] KIDNEY TRANSPLANT [place] [facility] REGION) [address]  [facility] 1 through 4  [place] [address]  [phone]        Oct 06, 2022 12:30 PM  (Arrive by 12:15 PM)  HOSPITAL FOLLOW UP with Pamalee Leyden, MD  [facility] GI MEDICINE [place] [facility] REGION) [address]  [facility] 1 through 4  [place] [address]  [phone]        Oct 10, 2022 11:15 AM  (Arrive by 10:45 AM)  CYSTO STENT REMOVAL with BJURLIN PROCEDURES  [facility] UROLOGY PROCEDURES [place] [facility] REGION) [address]  [place] [address]  [phone]        Oct 12, 2022 10:00 AM  (Arrive by 9:30 AM)  LAB ONLY with SURTRA LAB ONLY  [facility] TRANSPLANT SURGERY [place] [facility] REGION) [address]  [place] [address]  [phone]        Oct 12, 2022 11:30 AM  (Arrive by 11:00 AM)  RETURN PHARMD with TRANSPLANT SURGERY PHARMACY  [facility] TRANSPLANT SURGERY [place] [facility] REGION) [address]  [place] [address]  [phone]        Oct 17, 2022 10:00 AM  (Arrive by 9:50 AM)  RETURN HCP TELEPHONE with Eliezer Bottom, LCSW  [facility] KIDNEY TRANSPLANT [place] [facility] REGION) [address]  [place] [address]  [phone]        Nov 02, 2022 12:30 PM  (Arrive by 12:15 PM)  RETURN ICID/TRANSPLANT with Audley Hose, MD  [facility] INFECTIOUS DISEASES [place] [facility] REGION) [address]  [facility] 1 through 4  [place] [address]  [phone]        Nov 30, 2022 10:00 AM  (Arrive by 9:45 AM)  RETURN ICID/TRANSPLANT with Audley Hose, MD  [facility] INFECTIOUS DISEASES [place]  [facility] REGION) [address]  [facility] 1 through 4  [place] [address]  [phone]

## 2022-09-29 NOTE — Unmapped (Signed)
Transplant Nutrition Note    Team requested patient check in re: protein intake with wound vac. Patient and mother present during visit. Mother was engaged in discussion, patient was semi-engaged. Discussed need for high protein supplements along with high protein foods ([place], chicken, eggs, fish, milk, nuts etc). Encouraged patient to either consume 1 Fairlife protein shake or 2 Carnation Instant breakfast shakes daily a long with solid foods.   Patient stated that he thought this was achievable and mother also in agreement.     Lanelle Bal, RD, LDN, CCTD  Abdominal Transplant Dietitian   Pager: [phone]

## 2022-09-29 NOTE — Unmapped (Signed)
Writer assumed care at 0700. Vital signs stable. Patient voiding. Pain tolerable with current regimen. Patient tolerating regular diet. Patient agreeable to discharge plan.

## 2022-09-29 NOTE — Unmapped (Signed)
A&Ox4, SBA. VSS on RA. Continent x2. Pt dressing on abd clean, dry and intact. Pt received dose of pain medication at beginning of shift with improvement of pain. Pt received electrolyte replacement throughout shift and tolerated well. Labs ordered for this AM to recheck levels.     Problem: Adult Inpatient Plan of Care  Goal: Plan of Care Review  Outcome: Ongoing - Unchanged  Goal: Patient-Specific Goal (Individualized)  Outcome: Ongoing - Unchanged  Goal: Absence of Hospital-Acquired Illness or Injury  Outcome: Ongoing - Unchanged  Intervention: Identify and Manage Fall Risk  Recent Flowsheet Documentation  Taken 09/29/2022 0200 by Josue Hector, RN  Safety Interventions:   lighting adjusted for tasks/safety   low bed   fall reduction program maintained  Taken 09/29/2022 0000 by Josue Hector, RN  Safety Interventions:   lighting adjusted for tasks/safety   low bed   fall reduction program maintained  Taken 09/28/2022 2200 by Josue Hector, RN  Safety Interventions:   lighting adjusted for tasks/safety   low bed   fall reduction program maintained  Goal: Optimal Comfort and Wellbeing  Outcome: Ongoing - Unchanged  Goal: Readiness for Transition of Care  Outcome: Ongoing - Unchanged  Goal: Rounds/Family Conference  Outcome: Ongoing - Unchanged     Problem: Wound  Goal: Optimal Coping  Outcome: Ongoing - Unchanged  Goal: Optimal Functional Ability  Outcome: Ongoing - Unchanged  Goal: Absence of Infection Signs and Symptoms  Outcome: Ongoing - Unchanged  Goal: Improved Oral Intake  Outcome: Ongoing - Unchanged  Goal: Optimal Pain Control and Function  Outcome: Ongoing - Unchanged  Goal: Skin Health and Integrity  Outcome: Ongoing - Unchanged  Intervention: Optimize Skin Protection  Recent Flowsheet Documentation  Taken 09/29/2022 0200 by Josue Hector, RN  Head of Bed [facility]) Positioning: HOB elevated  Taken 09/29/2022 0000 by Josue Hector, RN  Head of Bed [facility]) Positioning: [facility] elevated  Taken 09/28/2022 2200 by Josue Hector, RN  Head of Bed [facility]) Positioning: HOB elevated  Goal: Optimal Wound Healing  Outcome: Ongoing - Unchanged

## 2022-10-02 DIAGNOSIS — Z940 Kidney transplant status: Principal | ICD-10-CM

## 2022-10-03 DIAGNOSIS — Z940 Kidney transplant status: Principal | ICD-10-CM

## 2022-10-03 DIAGNOSIS — B9735 Human immunodeficiency virus, type 2 [HIV 2] as the cause of diseases classified elsewhere: Principal | ICD-10-CM

## 2022-10-03 LAB — RENAL FUNCTION PANEL
ALBUMIN: 3.5 g/dL — ABNORMAL LOW (ref 4.1–5.1)
BLOOD UREA NITROGEN: 15 mg/dL (ref 6–20)
BUN / CREAT RATIO: 7 — ABNORMAL LOW (ref 9–20)
CALCIUM: 12.1 mg/dL — ABNORMAL HIGH (ref 8.7–10.2)
CHLORIDE: 112 mmol/L — ABNORMAL HIGH (ref 96–106)
CO2: 19 mmol/L — ABNORMAL LOW (ref 20–29)
CREATININE: 2.1 mg/dL — ABNORMAL HIGH (ref 0.76–1.27)
EGFR: 40 mL/min/{1.73_m2} — ABNORMAL LOW
GLUCOSE: 106 mg/dL — ABNORMAL HIGH (ref 70–99)
PHOSPHORUS, SERUM: 2 mg/dL — ABNORMAL LOW (ref 2.8–4.1)
POTASSIUM: 5 mmol/L (ref 3.5–5.2)
SODIUM: 138 mmol/L (ref 134–144)

## 2022-10-03 LAB — CBC W/ DIFFERENTIAL
BANDED NEUTROPHILS ABSOLUTE COUNT: 0 10*3/uL (ref 0.0–0.1)
BASOPHILS ABSOLUTE COUNT: 0 10*3/uL (ref 0.0–0.2)
BASOPHILS RELATIVE PERCENT: 1 %
EOSINOPHILS ABSOLUTE COUNT: 0.1 10*3/uL (ref 0.0–0.4)
EOSINOPHILS RELATIVE PERCENT: 1 %
HEMATOCRIT: 29.9 % — ABNORMAL LOW (ref 37.5–51.0)
HEMOGLOBIN: 9.5 g/dL — ABNORMAL LOW (ref 13.0–17.7)
IMMATURE GRANULOCYTES: 0 %
LYMPHOCYTES ABSOLUTE COUNT: 0.5 10*3/uL — ABNORMAL LOW (ref 0.7–3.1)
LYMPHOCYTES RELATIVE PERCENT: 10 %
MEAN CORPUSCULAR HEMOGLOBIN CONC: 31.8 g/dL (ref 31.5–35.7)
MEAN CORPUSCULAR HEMOGLOBIN: 29.3 pg (ref 26.6–33.0)
MEAN CORPUSCULAR VOLUME: 92 fL (ref 79–97)
MONOCYTES ABSOLUTE COUNT: 0.4 10*3/uL (ref 0.1–0.9)
MONOCYTES RELATIVE PERCENT: 7 %
NEUTROPHILS ABSOLUTE COUNT: 4.1 10*3/uL (ref 1.4–7.0)
NEUTROPHILS RELATIVE PERCENT: 81 %
PLATELET COUNT: 193 10*3/uL (ref 150–450)
RED BLOOD CELL COUNT: 3.24 x10E6/uL — ABNORMAL LOW (ref 4.14–5.80)
RED CELL DISTRIBUTION WIDTH: 14.7 % (ref 11.6–15.4)
WHITE BLOOD CELL COUNT: 5.1 10*3/uL (ref 3.4–10.8)

## 2022-10-03 LAB — MAGNESIUM: MAGNESIUM: 1.4 mg/dL — ABNORMAL LOW (ref 1.6–2.3)

## 2022-10-03 NOTE — Unmapped (Signed)
Called to check in after recent admission and review labs.  Pt reports he is drinking about 2 liters of fluid daily.  Pt advised to try to increase fluid intske to 3 liters daily as his calcium level is now 12.1.  Pt advised that we will plan to give him 1 liter NS bolus thsi Friday in clinic.  Pt reports he will not be albe to get to clinic until 12 pm on Friday and that he will see the GI team first since nephrology appt will take longer.  Pt declined to re-schedule the GI appts.    Pt also states he is unable to make his stent removal appt on 8/13 - offered an alternate appt on 8/30 at 8:15 am ( although not recommended as it is beyond 6 weeks post-op) .  Pt reports he has to check with his mother who is CG as she works and cannot make that early of an appt since they are traveling from [place].  Pt offered a referral to [facility] house but he declined.  Pt provided the number to urology clinic to re-schedule appt and also strongly encouraged to keep the appt on 8/13 as it is important to have stent removed to prevent infections.      Pt also reminded of 8/15 appt at [facility]  with surgery team.  He will see if he is able to get to that appt.      Pt abruptly ended call and siad he had to go

## 2022-10-03 NOTE — Unmapped (Signed)
Multidisciplinary Oncology Program Intake Form    Referral Receive Date:  09/21/22    Rapid Access Appointment was offered and declined  No -  Patient not in scope for Rapid Access    Reason for Referral:   Initial Consultation: No treatment started and no confirmed diagnosis   Disease Group: Endocrine  Specialty:  Surgery    Referral Method:  Epic    Insurance  Primary Insurance: Medicare    Authorization Obtained: Yes      Record Collection:     RECORDS -  INTERNAL REFERRAL    Screening Questions:     If <43, is the patient interested in learning about [facility] Fertility Preservation? No    Close the Loop Communication:  Referring Provider Contacted: No  Patient Contacted: Yes    Welcome Packet   Date Sent: 10/03/22  Sent: Email

## 2022-10-04 DIAGNOSIS — Z940 Kidney transplant status: Principal | ICD-10-CM

## 2022-10-04 LAB — CBC W/ DIFFERENTIAL
BANDED NEUTROPHILS ABSOLUTE COUNT: 0 10*3/uL (ref 0.0–0.1)
BASOPHILS ABSOLUTE COUNT: 0 10*3/uL (ref 0.0–0.2)
BASOPHILS RELATIVE PERCENT: 1 %
EOSINOPHILS ABSOLUTE COUNT: 0.1 10*3/uL (ref 0.0–0.4)
EOSINOPHILS RELATIVE PERCENT: 2 %
HEMATOCRIT: 26.3 % — ABNORMAL LOW (ref 37.5–51.0)
HEMOGLOBIN: 8.4 g/dL — ABNORMAL LOW (ref 13.0–17.7)
IMMATURE GRANULOCYTES: 1 %
LYMPHOCYTES ABSOLUTE COUNT: 0.6 10*3/uL — ABNORMAL LOW (ref 0.7–3.1)
LYMPHOCYTES RELATIVE PERCENT: 17 %
MEAN CORPUSCULAR HEMOGLOBIN CONC: 31.9 g/dL (ref 31.5–35.7)
MEAN CORPUSCULAR HEMOGLOBIN: 29.8 pg (ref 26.6–33.0)
MEAN CORPUSCULAR VOLUME: 93 fL (ref 79–97)
MONOCYTES ABSOLUTE COUNT: 0.2 10*3/uL (ref 0.1–0.9)
MONOCYTES RELATIVE PERCENT: 6 %
NEUTROPHILS ABSOLUTE COUNT: 2.6 10*3/uL (ref 1.4–7.0)
NEUTROPHILS RELATIVE PERCENT: 73 %
PLATELET COUNT: 143 10*3/uL — ABNORMAL LOW (ref 150–450)
RED BLOOD CELL COUNT: 2.82 x10E6/uL — ABNORMAL LOW (ref 4.14–5.80)
RED CELL DISTRIBUTION WIDTH: 14.8 % (ref 11.6–15.4)
WHITE BLOOD CELL COUNT: 3.6 10*3/uL (ref 3.4–10.8)

## 2022-10-04 LAB — TACROLIMUS LEVEL: TACROLIMUS BLOOD: 14.6 ng/mL (ref 2.0–20.0)

## 2022-10-04 LAB — CMV DNA, QUANTITATIVE, PCR: CMV QUANT: NEGATIVE [IU]/mL

## 2022-10-04 MED ORDER — TACROLIMUS 1 MG CAPSULE, IMMEDIATE-RELEASE
ORAL_CAPSULE | Freq: Two times a day (BID) | ORAL | 11 refills | 30 days | Status: CP
Start: 2022-10-04 — End: 2023-10-04
  Filled 2022-10-06: qty 180, 30d supply, fill #0

## 2022-10-04 MED ORDER — ATOVAQUONE 750 MG/5 ML ORAL SUSPENSION
Freq: Every day | ORAL | 11 refills | 30.00000 days | Status: CP
Start: 2022-10-04 — End: 2022-09-21

## 2022-10-04 MED ORDER — TACROLIMUS 5 MG CAPSULE, IMMEDIATE-RELEASE
ORAL_CAPSULE | Freq: Two times a day (BID) | ORAL | 11 refills | 60 days | Status: CP
Start: 2022-10-04 — End: ?

## 2022-10-04 NOTE — Unmapped (Signed)
Reviewed recent labs and tac level 14.6 with Cecilie Lowers, pt currently taking 11 mg bid and level is a 12 hour level.  Pt advised to HOLD his dose this am and then reduce dose to 8 mg bid.  Pt verbalized understanding.  Pt denies any tremors or headaches.

## 2022-10-04 NOTE — Unmapped (Signed)
This onboarding is for the following medications:  1) Prograf    [facility]   Patient Onboarding/Medication Counseling    [name] is a 42 y.o. male with a kidney transplant who I am counseling today on continuation of therapy.  I am speaking to the patient.    Was a Nurse, learning disability used for this call? No    Verified patient's date of birth / HIPAA.    Specialty medication(s) to be sent: Transplant: tacrolimus 1mg       Non-specialty medications/supplies to be sent: minoxidil      Medications not needed at this time: none       The patient declined counseling on missed dose instructions, goals of therapy, side effects and monitoring parameters, warnings and precautions, drug/food interactions, and storage, handling precautions, and disposal because they have taken the medication previously. The information in the declined sections below are for informational purposes only and was not discussed with patient.     Prograf (tacrolimus)    Medication & Administration     Dosage: Take 1-5mg  capsules with 3-1mg  capsules for a total of 8mg  two times a day.     Administration:   May take with or without food  Take 12 hours apart    Adherence/Missed dose instructions:  Take a missed dose as soon as you think about it.  If it is close to the time for your next dose, skip the missed dose and go back to your normal time.  Do not take 2 doses at the same time or extra doses.    Goals of Therapy     To prevent organ rejection    Side Effects & Monitoring Parameters     Common side effects  Dizziness  Fatigue  Headache  Stuffy nose or sore throat  Nausea, vomiting, stomach pain, diarrhea, constipation  Heartburn  Back or joint pain  Increased risk of infection    The following side effects should be reported to the provider:  Allergic reaction  Kidney issues (change in quantity or urine passed, blood in urine, or weight gain)  High blood pressure (dizziness, change in eyesight, headache)  Electrolyte issues (change in mood, confusion, muscle pain, or weakness)  Abnormal breathing  Shakiness  Unexplained bleeding or bruising (gums bleeding, blood in urine, nosebleeds, any abnormal bleeding)  Signs of infection (fever, cough, wounds that will not heal)  Skin changes (sores, paleness, new or changed bumps or moles)    Monitoring Parameters  Renal function  Liver function  Glucose levels  Blood pressure  Tacrolimus trough levels  Cardiac monitoring (for QT prolongation)      Contraindications, Warnings, & Precautions     Black Box Warning: Infections - immunosuppressant agents increase the risk of infection that may lead to hospitalization or death  Black Box Warning: Malignancy - immunosuppressant agents may be associated with the development of malignancies that may lead to hospitalization or death  Limit or avoid sun and ultraviolet light exposure, use appropriate sun protection  Myocardial hypertrophy -avoid use in patients with congenital long QT syndrome  Diabetes mellitus - the risk for new-onset diabetes and insulin-dependent post-transplant diabetes mellitus is increased with tacrolimus use after transplantation  GI perforation  Hyperkalemia  Hypertension  Nephrotoxicity  Neurotoxicity  This is a narrow therapeutic index drug. Do not switch manufacturers without first talking to the provider.    Drug/Food Interactions     Medication list reviewed in Epic. The patient was instructed to inform the care  team before taking any new medications or supplements. No drug interactions identified.   Avoid alcohol  Avoid grapefruit or grapefruit juice  Avoid live vaccines    Storage, Handling Precautions, & Disposal     Store at room temperature  Keep away from children and pets      Current Medications (including OTC/herbals), Comorbidities and Allergies     Current Outpatient Medications   Medication Sig Dispense Refill    acetaminophen (TYLENOL) 160 mg/5 mL solution Take 20.3 mL (650 mg total) by mouth every six (6) hours as needed for fever or pain. 118 mL 0    albuterol HFA 90 mcg/actuation inhaler Inhale 1-2 puffs every four (4) to six (6) hours as needed for wheezing or shortness of breath. 18 g 11    azathioprine (IMURAN) 50 mg tablet Take 3 tablets (150 mg total) by mouth daily. 90 tablet 11    bictegrav-emtricit-tenofov ala (BIKTARVY) 50-200-25 mg tablet Take 1 tablet by mouth every morning. 30 tablet 11    calamine-zinc oxide 8-8 % Lotn Apply topically four (4) times a day. 177 mL 0    carvedilol (COREG) 25 MG tablet Take 2 tablets (50 mg total) by mouth two (2) times a day. 120 tablet 11    dapsone 100 MG tablet Take 1 tablet (100 mg total) by mouth daily. START taking on 10/05/22 30 tablet 11    diphenhydrAMINE (BENADRYL) 25 mg capsule Take 1 capsule (25 mg total) by mouth every six (6) hours as needed for itching. 100 capsule 0    docusate sodium (COLACE) 100 MG capsule Take 1 capsule (100 mg total) by mouth two (2) times a day as needed for constipation. 60 capsule 11    hydrocortisone 1 % cream Apply topically two (2) times a day. 30 g 11    magnesium oxide-Mg AA chelate (MAGNESIUM, AMINO ACID CHELATE,) 133 mg Take 1 tablet by mouth two (2) times a day. Hold until directed to take by nurse coordinator 60 tablet 11    minoxidil (LONITEN) 2.5 MG tablet Take 2 tablets (5 mg total) by mouth two (2) times a day. 60 tablet 11    naloxone (NARCAN) 4 mg nasal spray SPRAY TO 1 NOSTRIL IN CASE OF OPIOID OVERDOSE, MAY REPEAT IN 2-3 MINUTES UNTIL EMS ARRIVES OR RESPONSIVE      pantoprazole (PROTONIX) 40 MG tablet Take 1 tablet (40 mg total) by mouth two (2) times a day. 60 tablet 11    predniSONE (DELTASONE) 5 MG tablet Take 1 tablet (5 mg total) by mouth in the morning. 30 tablet 11    prochlorperazine (COMPAZINE) 5 MG tablet Take 1 tablet (5 mg total) by mouth every eight (8) hours as needed for nausea. 30 tablet 0    sodium bicarbonate 650 mg tablet Take 1 tablet (650 mg total) by mouth two (2) times a day. 60 tablet 2 tacrolimus (PROGRAF) 1 MG capsule Take 3 capsules (3 mg total) by mouth two (2) times a day. Take with 5 mg capsules (for dose adjustments); on tac 8 mg twice daily 180 capsule 11    tacrolimus (PROGRAF) 5 MG capsule Take 1 capsule (5 mg total) by mouth two (2) times a day. Take with three, 1 mg capsule, twice a day for total of 8 mg twice daily 120 capsule 11    valGANciclovir (VALCYTE) 450 mg tablet Take 1 tablet (450 mg total) by mouth daily. Hold until directed to take by nurse coordinator 30 tablet 2  No current facility-administered medications for this visit.       Allergies   Allergen Reactions    Amlodipine Hives, Palpitations, Rash and Other (See Comments)     Causes fatigue      Doxercalciferol Hives    Fish Containing Products Hives and Shortness Of Breath     Please do not serve fish for meals. Highly allergic         Iron Sucrose Hives    Labetalol Itching and Rash    Metoclopramide Hives and Other (See Comments)     Causes ticks/ jerks    Metoprolol Hives    Nifedipine Hives, Itching and Shortness Of Breath    Other Swelling     Suture material, localized swelling at closure site    Sulfamethoxazole-Trimethoprim Rash and Other (See Comments)     Renal failure      Vancomycin Hives and Other (See Comments)     Patient states vancomycin caused kidney injury    Calcitonin     Peanut Oil Other (See Comments)    Potassium Clavulanate     Clindamycin Other (See Comments) and Palpitations     Chest pain    Hydralazine Itching and Dizziness           Sensipar [Cinacalcet] Hives       Patient Active Problem List   Diagnosis    Asthma    CMV (cytomegalovirus infection) (CMS-HCC)    Essential hypertension    HIV infection (CMS-HCC)    OSA (obstructive sleep apnea)    Other specified disorders of parathyroid gland (CMS-HCC)    Multiple drug allergies    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)    Dialysis AV fistula malfunction (CMS-HCC)    Acute blood loss anemia    Melena Abdominal pain    Nephropathy associated with AIDS (CMS-HCC)    Anemia of chronic disease    GERD (gastroesophageal reflux disease)    Chest pressure    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    Appropriateness of Therapy     Acute infections noted within Epic:  No active infections  Patient reported infection: None    Is the medication and dose appropriate based on diagnosis, medication list, comorbidities, allergies, medical history, patient???s ability to self-administer the medication, and therapeutic goals? Yes    Prescription has been clinically reviewed: Yes      Baseline Quality of Life Assessment      How many days over the past month did your kidney transplant  keep you from your normal activities? For example, brushing your teeth or getting up in the morning. 0    Financial Information     Medication Assistance provided: None Required    Anticipated copay of $0 reviewed with patient. Verified delivery address.    Delivery Information  Scheduled delivery date: 10/09/22    Expected start date: 10/09/22      Medication will be delivered via UPS to the prescription address in [facility]  This shipment will require a signature.      Explained the services we provide at [facility] and that each month we would call to set up refills.  Stressed importance of returning phone calls so that we could ensure they receive their medications in time each month.  Informed patient that we should be setting up refills 7-10 days prior to when they will run out of medication.  A pharmacist will reach out to perform a clinical assessment periodically.  Informed patient that a welcome packet, containing information about our pharmacy and other support services, a Notice of Privacy Practices, and a drug information handout will be sent.      The patient or caregiver noted above participated in the development of this care plan and knows that they can request review of or adjustments to the care plan at any time.      Patient or caregiver verbalized understanding of the above information as well as how to contact the pharmacy at [phone] option 4 with any questions/concerns.  The pharmacy is open Monday through Friday 8:30am-4:30pm.  A pharmacist is available 24/7 via pager to answer any clinical questions they may have.    Patient Specific Needs     Does the patient have any physical, cognitive, or cultural barriers? No    Does the patient have adequate living arrangements? (i.e. the ability to store and take their medication appropriately) Yes    Did you identify any home environmental safety or security hazards? No    Patient prefers to have medications discussed with  Patient     Is the patient or caregiver able to read and understand education materials at a high school level or above? Yes    Patient's primary language is  English     Is the patient high risk? No    SOCIAL DETERMINANTS OF HEALTH     At the [facility], we have learned that life circumstances - like trouble affording food, housing, utilities, or transportation can affect the health of many of our patients.   That is why we wanted to ask: are you currently experiencing any life circumstances that are negatively impacting your health and/or quality of life? No    Social Determinants of Health     Financial Resource Strain: Low Risk  (09/06/2022)    Overall Financial Resource Strain (CARDIA) Difficulty of Paying Living Expenses: Not hard at all   Internet Connectivity: Not on file   Food Insecurity: No Food Insecurity (09/06/2022)    Hunger Vital Sign     Worried About Running Out of Food in the Last Year: Never true     Ran Out of Food in the Last Year: Never true   Tobacco Use: Low Risk  (09/27/2022)    Patient History     Smoking Tobacco Use: Never     Smokeless Tobacco Use: Never     Passive Exposure: Not on file   Housing/Utilities: Low Risk  (09/06/2022)    Housing/Utilities     Within the past 12 months, have you ever stayed: outside, in a car, in a tent, in an overnight shelter, or temporarily in someone else's home (i.e. couch-surfing)?: No     Are you worried about losing your housing?: No     Within the past  12 months, have you been unable to get utilities (heat, electricity) when it was really needed?: No   Alcohol Use: Not At Risk (09/05/2018)    Received from [facility], [facility]    AUDIT-C     Frequency of Alcohol Consumption: Never     Average Number of Drinks: Not on file     Frequency of Binge Drinking: Not on file   Transportation Needs: No Transportation Needs (09/06/2022)    PRAPARE - Transportation     Lack of Transportation (Medical): No     Lack of Transportation (Non-Medical): No   Substance Use: Not on file   Health Literacy: Not on file   Physical Activity: Not on file   Interpersonal Safety: Unknown (10/04/2022)    Interpersonal Safety     Unsafe Where You Currently Live: Not on file     Physically Hurt by Anyone: Not on file     Abused by Anyone: Not on file   Stress: Not on file   Intimate Partner Violence: Patient Declined (03/05/2022)    Received from [facility] [place], [facility] [place], Afraid, Rape, and Kick questionnaire     Fear of Current or Ex-Partner: Patient declined     Emotionally Abused: Patient declined     Physically Abused: Patient declined     Sexually Abused: Patient declined   Depression: Not at risk (11/08/2021)    Received from [facility], [facility]    PHQ-2     Depression Risk: 0   Social Connections: Unknown (07/07/2021)    Received from [facility]    Social Network     Social Network: Not on file       Would you be willing to receive help with any of the needs that you have identified today? Not applicable       Tera Helper, [facility]  [facility] Specialty Pharmacist

## 2022-10-04 NOTE — Unmapped (Addendum)
[facility] Pharmacist has reviewed a new prescription for tacrolimus that indicates a dose decrease.  Patient was counseled on this dosage change by CC- see epic note from 10/04/22.  Next refill call date adjusted if necessary.          Clinical Assessment Needed For: Dose Change  Medication: Tacrolimus 1mg  capsule  Last Fill Date/Day Supply: 09/21/2022 / 30 days  Copay $0  Was previous dose already scheduled to fill: No    Notes to Pharmacist: N/A      Clinical Assessment Needed For: Dose Change  Medication: Tacrolimus 5mg  capsule  Last Fill Date/Day Supply: 09/21/2022 / 30 days  Copay $0  Was previous dose already scheduled to fill: No    Notes to Pharmacist: N/A

## 2022-10-05 DIAGNOSIS — Z862 Personal history of diseases of the blood and blood-forming organs and certain disorders involving the immune mechanism: Principal | ICD-10-CM

## 2022-10-05 DIAGNOSIS — N189 Chronic kidney disease, unspecified: Principal | ICD-10-CM

## 2022-10-05 DIAGNOSIS — D631 Anemia in chronic kidney disease: Principal | ICD-10-CM

## 2022-10-05 DIAGNOSIS — N184 Chronic kidney disease, stage 4 (severe): Principal | ICD-10-CM

## 2022-10-05 DIAGNOSIS — Z940 Kidney transplant status: Principal | ICD-10-CM

## 2022-10-05 LAB — RENAL FUNCTION PANEL
ALBUMIN: 3.6 g/dL — ABNORMAL LOW (ref 4.1–5.1)
BLOOD UREA NITROGEN: 20 mg/dL (ref 6–20)
BUN / CREAT RATIO: 9 (ref 9–20)
CALCIUM: 12.1 mg/dL — ABNORMAL HIGH (ref 8.7–10.2)
CHLORIDE: 111 mmol/L — ABNORMAL HIGH (ref 96–106)
CO2: 17 mmol/L — ABNORMAL LOW (ref 20–29)
CREATININE: 2.13 mg/dL — ABNORMAL HIGH (ref 0.76–1.27)
EGFR: 40 mL/min/{1.73_m2} — ABNORMAL LOW
GLUCOSE: 92 mg/dL (ref 70–99)
PHOSPHORUS, SERUM: 2.7 mg/dL — ABNORMAL LOW (ref 2.8–4.1)
POTASSIUM: 5.5 mmol/L — ABNORMAL HIGH (ref 3.5–5.2)
SODIUM: 138 mmol/L (ref 134–144)

## 2022-10-05 LAB — MAGNESIUM: MAGNESIUM: 1.5 mg/dL — ABNORMAL LOW (ref 1.6–2.3)

## 2022-10-05 MED ORDER — ATOVAQUONE 750 MG/5 ML ORAL SUSPENSION
Freq: Every day | ORAL | 11 refills | 21 days
Start: 2022-10-05 — End: ?

## 2022-10-05 NOTE — Unmapped (Signed)
[facility]  Hospital Follow-up Clinic       Reason for Visit: Follow-up of odynophagia and severe esophagitis  Referring Provider: Belva Bertin   Primary Care Provider: Belva Bertin, RTL    Assessment & Plan: Christian Brown is a 42 y.o. 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  Patient underwent EGD 07/31/2022 with findings of clean based gastric ulcer and moderate duodenitis. Was discharged on PPI. HP negative. He represented to the hospital with consultation for heart burn. He described burning chest/epigasric pain immediately after transplant surgery that progressed in severity. He underwent repeat EGD 09/12/2022 which showed severe esophagitis in the middle and distal third of the esophagus. Pahtology was nonspecific esophagitis with negative viral and fugal results. Thought she may have had mycophenolate induced esophagitis. Plan was for repeat EGD in 2 months from discharge, PO PPI BID, viscous lidocaine for relief, and alternative therapy to mycophenolate. Stopped the mycophenolate in the hospital. At the follow up visit he feels significantly better and only has some mild reflux symptoms with spicy foods. While PPI may be helping, the improvement can most likely be attributed to switching cellcept to azathioprine.    Recommendations  - Pursue EGD, provided information to schedule, messaged schedulers  - Continue BID PPI for minimum 8 weeks  - Agree with stopping cellcept if able    The patient has been seen as a one-time follow-up after their recent hospitalization. They do not require ongoing care from gastroenterology in the outpatient setting. Will follow up repeat EGD, otherwise patient can call for worsening symptoms or be referred by PCP, nephrology for ongoing GI needs.       Subjective   HPI:     Patient presents in hospital follow-up clinic after being recently admitted to the hospital in the setting of severe dysphagia and burning down aphasia all the way down his esophagus.  His symptoms started acutely following initially starting CellCept in the setting of his kidney transplant.  He received an upper endoscopy in the hospital with results detailed above.  Since stopping his CellCept medication he has had significant improvement in his symptoms and now is only experiencing some mild reflux with very greasy foods.  He denies any significant nausea, vomiting, abdominal pain, diarrhea, constipation.  He continues to have routine follow-up with his nephrologist.  When his symptoms were really severe with his esophagus he had lost about 15 to 20 pounds. He has not needed any of his prescribed compazine for nausea.        Objective   Vital Signs:  Vitals:    10/06/22 1332   BP: 144/83   Pulse: 80   Temp: 36.7 ??C (98.1 ??F)   TempSrc: Temporal   Weight: 70.4 kg (155 lb 3.2 oz)   Height: 170.2 cm (5' 7)      Body mass index is 24.31 kg/m??.    Physical Exam:   Gen: WDWN male in NAD, answers questions appropriately  Abdomen: Soft, ND, tenderness over his abdominal incision but appeared c/d/i no rebound/guarding, no hepatosplenomegaly  Extremities: No edema in the BLEs

## 2022-10-05 NOTE — Unmapped (Signed)
[facility] CLINIC PHARMACY NOTE  Christian Brown  [id number]    Medication changes today:   1. Aranesp 100 mcg given in clinic today  2. V fluids given in clinic  3. Start dapsone 100 mg daily  4. Start Valcyte 450 mg daily  5. Start cetirizine 10 mg daily PRN for itching    Education/Adherence tools provided today:  - Provided updated medication list  - Provided additional education on immunosuppression and transplant related medications including reviewing indications of medications, dosing and side effects  - Discussed adherence reminder tools such as cell phone alarms    Follow up items:  1. Goal of understanding indications and dosing of immunosuppression medications  2. CD4/HIV viral load  3. Calcium level  4. BK virus pending  5. Hemoglobin     Next visit with pharmacy in 1-2 weeks  ____________________________________________________________________    Christian Brown is a 42 y.o. 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 POD3 transferred to SICU for hypertensive emergency and placed on nicardipine gtt. Transferred back to floor on POD6 and developed gastritis and esophagitis.   Readmitted to [facility] 09/27/22-09/29/22 for incisional pain and drainage. Found to be hyperkalemic (5.8) and treated with Lokelma and insulin.    Rejection History: NTD  Infection History: NTD  ___________________________________________________________________    First visit with transplant pharmacy    Interval History: N/A    Seen by pharmacy today for: medication management and pill box fill and adherence education    CC:  Patient complains of  itching and pain around incision site    General: Fatigue  Neuro: Tremors  CV: No issues  Resp: No issues  GI: Nausea  GU: No issues  Derm: Itching and Rash  Psych: No issues.    Fluid Status:   Edema no, SOB no  Intake: ~2 L   Output: ~2.2 L   Plan: Continue to monitor      Vitals:    10/06/22 1146   BP: 137/72   Pulse: 84   Temp: 36.4 ??C (97.6 ??F)     ___________________________________________________________________    Allergies   Allergen Reactions    Amlodipine Hives, Palpitations, Rash and Other (See Comments)     Causes fatigue      Doxercalciferol Hives    Fish Containing Products Hives and Shortness Of Breath     Please do not serve fish for meals. Highly allergic         Iron Sucrose Hives    Labetalol Itching and Rash    Metoclopramide Hives and Other (See Comments)     Causes ticks/ jerks    Metoprolol Hives    Nifedipine Hives, Itching and Shortness Of Breath    Other Swelling     Suture material, localized swelling at closure site    Sulfamethoxazole-Trimethoprim Rash and Other (See Comments)     Renal failure      Vancomycin Hives and Other (See Comments)     Patient states vancomycin caused kidney injury    Calcitonin     Peanut Oil Other (See Comments)    Potassium  Clavulanate     Clindamycin Other (See Comments) and Palpitations     Chest pain    Hydralazine Itching and Dizziness           Sensipar [Cinacalcet] Hives       Medications reviewed in EPIC medication station and updated today by the clinical pharmacist practitioner.    Current Outpatient Medications   Medication Instructions    albuterol HFA 90 mcg/actuation inhaler Inhale 1-2 puffs every four (4) to six (6) hours as needed for wheezing or shortness of breath.    ALLERGY RELIEF(DIPHENHYDRAMIN) 25 mg, Oral, Every 6 hours PRN    azathioprine (IMURAN) 150 mg, Oral, Daily (standard)    bictegrav-emtricit-tenofov ala (BIKTARVY) 50-200-25 mg tablet 1 tablet, Oral, Every morning    calamine-zinc oxide 8-8 % Lotn Topical, 4 times a day    carvedilol (COREG) 50 mg, Oral, 2 times a day (standard)    Children's acetaminophen 650 mg, Oral, Every 6 hours PRN    dapsone 100 mg, Oral, Daily (standard), START taking on 10/05/22    docusate sodium (COLACE) 100 mg, Oral, 2 times a day PRN    hydrocortisone 1 % cream Topical, 2 times a day (standard)    magnesium oxide-Mg AA chelate (MAGNESIUM, AMINO ACID CHELATE,) 133 mg 1 tablet, Oral, 2 times a day (standard), Hold until directed to take by nurse coordinator    minoxidil (LONITEN) 5 mg, Oral, 2 times a day (standard)    naloxone (NARCAN) 4 mg nasal spray SPRAY TO 1 NOSTRIL IN CASE OF OPIOID OVERDOSE, MAY REPEAT IN 2-3 MINUTES UNTIL EMS ARRIVES OR RESPONSIVE    pantoprazole (PROTONIX) 40 mg, Oral, 2 times a day (standard)    predniSONE (DELTASONE) 5 mg, Oral, Daily    prochlorperazine (COMPAZINE) 5 mg, Oral, Every 8 hours PRN    sodium bicarbonate 650 mg, Oral, 2 times a day (standard)    tacrolimus (PROGRAF) 5 mg, Oral, 2 times a day, Take with three, 1 mg capsule, twice a day for total of 8 mg twice daily    tacrolimus (PROGRAF) 3 mg, Oral, 2 times a day, Take with 5 mg capsules (for dose adjustments); on tac 8 mg twice daily    valGANciclovir (VALCYTE) 450 mg, Oral, Daily (standard), Hold until directed to take by nurse coordinator       GRAFT FUNCTION: stable  Lab Results   Component Value Date    Creatinine 2.13 (H) 10/04/2022    Creatinine 2.10 (H) 10/02/2022    Creatinine 2.00 (H) 09/29/2022    Creatinine 1.70 (H) 09/28/2022    Creatinine 1.70 (H) 09/28/2022    Creatinine 1.84 (H) 09/27/2022    Creatinine 1.78 (H) 09/25/2022    Creatinine 2.08 (H) 09/22/2022       Baseline Scr: TBD  Scr nadir: TBD  Estimated Creatinine Clearance: 43.5 mL/min (A) (based on SCr of 2.13 mg/dL (H)).    Proteinuria/UPC: Yes: below.  Lab Results   Component Value Date    Protein/Creatinine Ratio, Urine 0.288 10/06/2022    Protein/Creatinine Ratio, Urine 0.356 09/17/2022       DSA: ntd  No results found for: DSAPT, DSACM, DSA1C    Zero hour biopsy: Not preformed  Biopsies to date: NTD      CURRENT IMMUNOSUPPRESSION:    Tacrolimus Goal: 8-11 (HIV+)  Tacrolimus (Prograf) 5 mg BID  Azathioprine 150 mg daily  Prednisone 5 mg daily  *converted to aza from MMF due to esophagitis/gastritis found on EGD 09/12/22  IMMUNOSUPPRESSION DRUG LEVELS:  Lab Results   Component Value Date    Tacrolimus, Trough 8.0 09/28/2022    Tacrolimus, Trough 9.6 09/21/2022    Tacrolimus, Trough 9.2 09/20/2022    Tacrolimus Lvl 14.6 10/02/2022    Tacrolimus Lvl 9.3 09/25/2022    Tacrolimus Lvl 8.4 09/22/2022     No results found for: CYCLO  No results found for: EVEROLIMUS  No results found for: SIROLIMUS    Prograf level not drawn today due. Would not be an accurate trough    Patient complains of tremor    WBC/ANC:   low  Lab Results   Component Value Date    WBC 3.6 10/04/2022       Plan: Will maintain current immunosuppression Consider retrial of mycophenolate (Myfortic) in the future pending repeat EGD. Continue to monitor.      OI Prophylaxis:   CMV Status: D-/ R+, moderate risk.   CMV prophylaxis: valganciclovir 450 mg daily x 3 months per protocol. (Not taking, patient prefers preemptive monitoring)  CMV Prophylaxis End Date: 12/06/22  Lab Results   Component Value Date    CMV Quant Negative 10/02/2022       PCP Prophylaxis: dapsone 100 mg daily x 6 months. (Start 10/05/22)  PCP Prophylaxis End Date: 09/05/23  G6PD 12.3 on 09/15/22, patient reports tolerating dapsone in the past   *Received inhaled pentamidine x1 on 09/07/22 (bronchospasms given albuterol/IV Benadryl), hx of Bactrim allergy (vomiting on treatment dose Bactrim for PJP)  Patient refusing atovaquone at this time.    Thrush: completed in hospital    Patient is  off prophylaxis    Plan:  Start dapsone 100 mg daily, start Valcyte 450 mg daily. Patient is amenable to restart CMV prophylaxis outpatient . If doesn't tolerate dapsone may consider retrial of Bactrim.  Continue to monitor.      CV Prophylaxis:  aspirin 81 mg (refused during initial discharge)  The ASCVD Risk score (Arnett DK, et al., 2019) failed to calculate.  No results found for: LIPID    Statin therapy: Not indicated; currently on none  Plan:  Recommend updated lipid panel post transplant . Continue to monitor     BP: Goal < 140/90. Clinic vitals reported above  Home BP ranges: 130s/70s-80s    Current meds include: carvedilol 25 mg BID, minoxidil 5 mg BID  *allergies to hydralazine, amlodipine, nifedipine, labetalol, metoprolol  Plan: within goal May consider ARB in the future if needed. Continue to monitor    Anemia of CKD:  H/H:   Lab Results   Component Value Date    HGB 8.4 (L) 10/04/2022     Lab Results   Component Value Date    HCT 26.3 (L) 10/04/2022     Iron panel:  Lab Results   Component Value Date    IRON 24 (L) 06/20/2019    TIBC 252.1 06/20/2019    FERRITIN 80.5 06/20/2019     Lab Results   Component Value Date    Iron Saturation (%) 10 (L) 06/20/2019       Prior ESA use: Aranesp therapy plan entered    Plan: out of goal. Start Aranesp 100 mcg x 1 in clinic today. Continue to monitor.     DM:   Lab Results   Component Value Date    A1C 4.1 (L) 09/05/2022   . Goal A1c < 7  History of Dm? No  Established with endocrinologist/PCP for BG managment? No  Currently on: None  Diet: Not assessed  Exercise: Not yet  Hypoglycemia: no  Plan: No change.   Continue to monitor      HIV: Diagnosed in 2015; pre-ART VL 98,300 (10/2013). VL last checked 09/05/22: <20  Current meds: Biktarvy 50-200-25 mg daily  Plan: Monitor CD4/HIV viral load every 60-90 days for 12 months post transplant.  Has ICID follow up 9/5.    Electrolytes: Mag is low, K elevated  Lab Results   Component Value Date    Potassium 5.5 (H) 10/04/2022    Sodium 138 10/04/2022    Magnesium 1.5 (L) 10/04/2022    CO2 17 (L) 10/04/2022    Phosphorus 3.6 09/29/2022       Meds currently on: sodium bicarb 650 mg BID, magnesium plus 133 mg BID (not taking)  Plan: No change.  Continue to monitor     GI/BM: pt reports 1-2BM  Meds currently on: docusate PRN (not taking), pantoprazole 40 mg BID, prochlorperazine 5 mg q8h prn (not taking)  Plan: No change.  Continue BID PPI x8 weeks with repeat EGD per GI. Continue to monitor    Pain: pt reports mild pain  Meds currently on: oxycodone 5 mg Q4H PRN (taking nightly), APAP PRN (not taking  Plan: No change.  Continue to monitor    Bone health:   Vitamin D Level: last level is wnl . Goal > 30.   Lab Results   Component Value Date    Vitamin D Total (25OH) 31.8 09/14/2022       Lab Results   Component Value Date    Calcium 12.1 (H) 10/04/2022    Calcium 12.1 (H) 10/02/2022       Last DEXA results:  none available  Current meds include: none *allergic to calcitonin and cincalcet  Plan: Vitamin D level  within goal,not on supplementation at this time. Continue to monitor.    Women's/Men's Health:  Christian Brown is a 42 y.o. male. Patient reports no men's/women's health issues  Plan: Continue to monitor    Immunizations:  Influenza [Annual]: Received 12/09/21    PCV13: Received 12/12/19  PPSV23: Received 11/26/18  PCV20: Never received    Shingrix Zoster [2 doses, 2 - 6 months apart]: Never received    COVID-19 [3 primary doses, 2 boosters]: 1st dose given 09/28/19, 2nd dose given 10/26/19, and Booster given 10/10/21    Immunization status: up to date and documented.    Pharmacy preference:  [facility]  Medication Refills:  N/A  Medication Access:  N/A    Adherence: Patient has average understanding of medications; was able to independently identify names/doses of immunosuppressants and OI meds.  Patient  does not fill their own pill box on a regular basis at home. Not using a pill box at home.  Patient brought medication card:yes  Pill box:did not bring  Plan: Discussed use of pill box at home, at this time patient prefers to not use a pill box; provided moderate adherence counseling/intervention    Patient was reviewed with [name] who was agreement with the stated plan:     During this visit, the following was completed:   BG log data assessment  BP log data assessment  Labs ordered and evaluated  complex treatment plan >1 DS   I spent a total of 20 minutes face to face with the patient delivering clinical care and providing education/counseling.    All questions/concerns were addressed to the patient's satisfaction.  __________________________________________  Beryle Lathe, PharmD  PGY2 Solid Organ  Transplant Pharmacy Resident     Hazeline Junker, PharmD, CPP  Abdominal Transplant Clinical Pharmacist Practitioner

## 2022-10-05 NOTE — Unmapped (Signed)
Therapy plan entered for Aranesp

## 2022-10-05 NOTE — Unmapped (Signed)
Patient called yesterday to ask about the air service for transportation of transplant patients from [facility] to [facility] area. I sent an email to [employer] [facility] and he will call them today to determine if he can use their services. Loma Boston, RN Inpatient Transplant Nurse Coordinator 10/05/2022 1:06 PM

## 2022-10-06 ENCOUNTER — Ambulatory Visit: Admit: 2022-10-06 | Discharge: 2022-10-06 | Payer: MEDICARE

## 2022-10-06 ENCOUNTER — Encounter: Admit: 2022-10-06 | Discharge: 2022-10-06 | Payer: MEDICARE

## 2022-10-06 ENCOUNTER — Institutional Professional Consult (permissible substitution): Admit: 2022-10-06 | Discharge: 2022-10-06 | Payer: MEDICARE

## 2022-10-06 DIAGNOSIS — Z940 Kidney transplant status: Principal | ICD-10-CM

## 2022-10-06 DIAGNOSIS — B20 Human immunodeficiency virus [HIV] disease: Principal | ICD-10-CM

## 2022-10-06 DIAGNOSIS — I151 Hypertension secondary to other renal disorders: Principal | ICD-10-CM

## 2022-10-06 DIAGNOSIS — K209 Esophagitis: Principal | ICD-10-CM

## 2022-10-06 DIAGNOSIS — Z79899 Other long term (current) drug therapy: Principal | ICD-10-CM

## 2022-10-06 DIAGNOSIS — D631 Anemia in chronic kidney disease: Principal | ICD-10-CM

## 2022-10-06 DIAGNOSIS — Z889 Allergy status to unspecified drugs, medicaments and biological substances status: Principal | ICD-10-CM

## 2022-10-06 DIAGNOSIS — B9735 Human immunodeficiency virus, type 2 [HIV 2] as the cause of diseases classified elsewhere: Principal | ICD-10-CM

## 2022-10-06 DIAGNOSIS — N184 Chronic kidney disease, stage 4 (severe): Principal | ICD-10-CM

## 2022-10-06 DIAGNOSIS — N186 End stage renal disease: Principal | ICD-10-CM

## 2022-10-06 DIAGNOSIS — N189 Chronic kidney disease, unspecified: Principal | ICD-10-CM

## 2022-10-06 DIAGNOSIS — E213 Hyperparathyroidism, unspecified: Principal | ICD-10-CM

## 2022-10-06 LAB — RENAL FUNCTION PANEL
ALBUMIN: 3.4 g/dL (ref 3.4–5.0)
ANION GAP: 4 mmol/L — ABNORMAL LOW (ref 5–14)
BLOOD UREA NITROGEN: 21 mg/dL (ref 9–23)
BUN / CREAT RATIO: 10
CALCIUM: 12.4 mg/dL (ref 8.7–10.4)
CHLORIDE: 115 mmol/L — ABNORMAL HIGH (ref 98–107)
CO2: 20.9 mmol/L (ref 20.0–31.0)
CREATININE: 2.02 mg/dL — ABNORMAL HIGH
EGFR CKD-EPI (2021) MALE: 42 mL/min/{1.73_m2} — ABNORMAL LOW (ref >=60)
GLUCOSE RANDOM: 92 mg/dL (ref 70–179)
PHOSPHORUS: 1.9 mg/dL — ABNORMAL LOW (ref 2.4–5.1)
POTASSIUM: 5.2 mmol/L — ABNORMAL HIGH (ref 3.4–4.8)
SODIUM: 140 mmol/L (ref 135–145)

## 2022-10-06 LAB — URINALYSIS WITH MICROSCOPY
BILIRUBIN UA: NEGATIVE
GLUCOSE UA: NEGATIVE
KETONES UA: NEGATIVE
NITRITE UA: NEGATIVE
PH UA: 7 (ref 5.0–9.0)
PROTEIN UA: NEGATIVE
RBC UA: 30 /HPF — ABNORMAL HIGH (ref <3)
SPECIFIC GRAVITY UA: 1.025 (ref 1.005–1.030)
SQUAMOUS EPITHELIAL: 3 /HPF (ref 0–5)
UROBILINOGEN UA: 0.2
WBC UA: 3 /HPF — ABNORMAL HIGH (ref <2)

## 2022-10-06 LAB — PROTEIN / CREATININE RATIO, URINE
CREATININE, URINE: 99.7 mg/dL
PROTEIN URINE: 28.7 mg/dL
PROTEIN/CREAT RATIO, URINE: 0.288

## 2022-10-06 LAB — CBC W/ AUTO DIFF
BASOPHILS ABSOLUTE COUNT: 0.1 10*9/L (ref 0.0–0.1)
BASOPHILS RELATIVE PERCENT: 1.8 %
EOSINOPHILS ABSOLUTE COUNT: 0.1 10*9/L (ref 0.0–0.5)
EOSINOPHILS RELATIVE PERCENT: 1.4 %
HEMATOCRIT: 33.1 % — ABNORMAL LOW (ref 39.0–48.0)
HEMOGLOBIN: 10.4 g/dL — ABNORMAL LOW (ref 12.9–16.5)
LYMPHOCYTES ABSOLUTE COUNT: 0.5 10*9/L — ABNORMAL LOW (ref 1.1–3.6)
LYMPHOCYTES RELATIVE PERCENT: 10.8 %
MEAN CORPUSCULAR HEMOGLOBIN CONC: 31.4 g/dL — ABNORMAL LOW (ref 32.0–36.0)
MEAN CORPUSCULAR HEMOGLOBIN: 29.1 pg (ref 25.9–32.4)
MEAN CORPUSCULAR VOLUME: 92.8 fL (ref 77.6–95.7)
MEAN PLATELET VOLUME: 8 fL (ref 6.8–10.7)
MONOCYTES ABSOLUTE COUNT: 0.3 10*9/L (ref 0.3–0.8)
MONOCYTES RELATIVE PERCENT: 6.2 %
NEUTROPHILS ABSOLUTE COUNT: 3.4 10*9/L (ref 1.8–7.8)
NEUTROPHILS RELATIVE PERCENT: 79.8 %
PLATELET COUNT: 226 10*9/L (ref 150–450)
RED BLOOD CELL COUNT: 3.57 10*12/L — ABNORMAL LOW (ref 4.26–5.60)
RED CELL DISTRIBUTION WIDTH: 16.7 % — ABNORMAL HIGH (ref 12.2–15.2)
WBC ADJUSTED: 4.3 10*9/L (ref 3.6–11.2)

## 2022-10-06 LAB — IRON & TIBC
IRON SATURATION: 31 % (ref 20–55)
IRON: 82 ug/dL
TOTAL IRON BINDING CAPACITY: 265 ug/dL (ref 250–425)

## 2022-10-06 LAB — MAGNESIUM: MAGNESIUM: 1.4 mg/dL — ABNORMAL LOW (ref 1.6–2.6)

## 2022-10-06 LAB — PARATHYROID HORMONE (PTH): PARATHYROID HORMONE INTACT: 1259.7 pg/mL — ABNORMAL HIGH (ref 18.5–88.1)

## 2022-10-06 LAB — TACROLIMUS LEVEL: TACROLIMUS BLOOD: 20 ng/mL (ref 2.0–20.0)

## 2022-10-06 LAB — FERRITIN: FERRITIN: 161.8 ng/mL

## 2022-10-06 MED ORDER — CETIRIZINE 10 MG TABLET
ORAL_TABLET | Freq: Every day | ORAL | 11 refills | 30 days | Status: CP | PRN
Start: 2022-10-06 — End: 2023-10-06

## 2022-10-06 MED ADMIN — darbepoetin alfa-polysorbate (ARANESP) injection 100 mcg: 100 ug | SUBCUTANEOUS | @ 17:00:00 | Stop: 2022-10-06

## 2022-10-06 MED FILL — MINOXIDIL 2.5 MG TABLET: ORAL | 15 days supply | Qty: 60 | Fill #0

## 2022-10-06 NOTE — Unmapped (Signed)
1530 PIV started without problem.  Labs obtained. NS bolus started.

## 2022-10-06 NOTE — Unmapped (Addendum)
[place] [facility] at [facility]  Phone: [phone]         After Visit Instructions:  -- Please pursue a repeat upper endoscopy in late September, early October to assess healing of your esophagus  -- Continue twice daily Protonix for 8 weeks minimum and then can cut back to once daily if you have baseline reflux symptoms    -- You have an order in your record to get you scheduled for a upper endoscopy. Call [phone], Option 2 in order to get your procedure scheduled.     -- I will see you back in clinic in as needed for worsening symptoms. If you need to contact me, you may either send me a MyChart message or call the office and ask to speak with Melissa Noon (the nurse that works with me). MyChart messages are for non-urgent questions and may take up to 5 days to receive a response.     -------------------------------------------------------------------------------------------------------  Thank you for visiting our Gastroenterology Clinic!    Please fill out this survey to help [place] improve our physician services. This survey is about your experience with your clinic doctor today. All answers are confidential. Your physician will not know who completed this survey. You can access the survey using the QR code, below, or by visiting: [email or web address]         APPOINTMENT SCHEDULING FOR GI CLINIC AND GI PROCEDURES:  GI MEDICINE CLINIC  [phone] option 1     GI PROCEDURES         [phone] option 2   *To schedule, reschedule, or cancel your GI appointment, please call [phone]. If you are unable to come to an appointment, please notify us as soon as possible, preferably 24 hours in advance. Doing so may allow other patients with urgent needs to be scheduled in a cancelled appointment slot.     RADIOLOGY - to schedule your imaging procedure, please call [phone] opt 1     TEST RESULTS   If you have a MyChart account, your new results will be sent to you through your MyChart account at [email or web address]. For results that require follow-up, a member of your healthcare team will also contact you directly.    PRESCRIPTION REFILL REQUESTS  To request prescription refills, please contact your pharmacy or send your healthcare team a message through your MyChart account at [email or web address]    RECORD REQUESTS  For questions related to medical records, please call Medical Records Release of Information at [phone]

## 2022-10-06 NOTE — Unmapped (Signed)
NS bolus completed after patient received therapeutic amount of IV fluids. Per MD ok to stop fluids and PIV removed. Pt discharged home.

## 2022-10-06 NOTE — Unmapped (Signed)
[facility]   TRANSPLANT FOLLOW UP     PCP: Belva Bertin, RTL   Kidney transplant coordinator: Celene Squibb    Date of Visit at Transplant clinic: 10/06/2022     Assessment/Recommendations:     # s/p deceased donor kidney transplant 09/05/2022   Native kidney disease collapsing FSGS/HIVAN  Graft function: creatinine 1.7-2.1, suspect higher today in setting of high tac level  DSAs: to draw per protocol  Post-surgical issues:  - aspirin for 1 year post-op  - ureteral stent removal 8/30  - incision healing well    # Immunosuppression  Tacrolimus (Prograf) 8 mg BID, goal trough 8-10 ng/mL  Azathioprine 150 mg daily  Prednisone 5 mg daily (indicated due to HIVAN)  Initially MMF but did not tolerate due to gastritis; he may benefit from trial of Myfortic in future due to his elevated rejection risk    # Acute issues today  none    # BP management   Goal 130/80, as tolerated.  - carvedilol 50 mg BID  - minoxidil 5 mg BID  Future role for ARB?  Intolerances to amlodipine/nifedipine and hydralazine, hives to metoprolol and labetalol\.    # Infectious disease  CMV: D-/R+ EBV: D+/R+   CMV ppx:  Valcyte x3 mo  PJP ppx: s/p pentamadine x1 (c/b acute resp distress), start dapsone today. Duration 6 months. Hx Bactrim allergy; he prefers to avoid atovaquone due to liquid med.  Follows with ID for his HIV, on Biktarvy    # Anemia screening  - gave Aranesp 100 mcg in clinic today 8/9    # Cardiovascular: primary prevention  The ASCVD Risk score (Arnett DK, et al., 2019) failed to calculate.    # CKD-BMD  # Hyperparathyroidism s/p parathyroidetomy April 2021  Hypercalcemia: taking 2L daily. Allergic to cinacaclet and there is not a good alternative.  - IVF today  - adding on PTH. He may need parathyroidectomy depending on pattern.  - referral to re-establish care with ENT (s/p parathyroidectomy April 2021 without re-implant; has one overactive gland remaining)    # Electrolytes  Mg: not yet needing supp  K acceptable    # Immunizations  Immunization History   Administered Date(s) Administered    COVID-19 VAC,BIVALENT,MODERNA(BLUE CAP) 10/10/2021    COVID-19 VACCINE,MRNA(MODERNA)(PF) 09/28/2019, 10/26/2019    HEPATITIS B VACCINE ADULT, ADJUVANTED, IM(HEPLISAV B) 01/16/2020, 03/18/2020, 05/12/2020    HEPATITIS B VACCINE ADULT,IM(ENERGIX B, RECOMBIVAX) 03/30/2015, 01/27/2016    Hepatitis A (Adult) 09/04/2016, 04/26/2017, 10/10/2021    Hepatitis A Vaccine - Unspecified Formulation 09/04/2016, 04/26/2017    Hepatitis B Vaccine, Unspecified Formulation 12/04/1994, 01/09/1995, 06/12/1995    INFLUENZA TIV (TRI) 44MO+ W/ PRESERV (IM) 11/20/2013, 11/11/2014, 03/15/2016, 11/27/2017, 12/11/2018    INFLUENZA TIV (TRI) PF (IM) 03/15/2016    Influenza Vaccine PF(Quad)(Egg Free)18+(Flublok) 12/09/2021    Influenza Vaccine Quad (IIV4 W/PRESERV) 44MO+ 11/01/2016, 11/26/2018    Influenza Vaccine Quad(IM)6 MO-Adult(PF) 11/20/2013, 11/11/2014, 11/29/2020    Influenza Virus Vaccine, unspecified formulation 11/27/2017, 12/11/2018, 12/12/2019, 11/27/2020    MENINGOCOCCAL VACCINE, A,C,Y, W-135(IM)(MENVEO) 07/10/2016, 03/22/2017    MMR 01/09/1995    Meningococcal Conjugate MCV4P 07/10/2016, 03/22/2017    PNEUMOCOCCAL POLYSACCHARIDE 23-VALENT 11/20/2013, 11/20/2013, 11/26/2018, 11/26/2018    Pneumococcal Conjugate 13-Valent 08/28/2017, 08/28/2017, 12/12/2019    TD(TDVAX),ADSORBED,2LF(IM)(PF) 06/12/1995    TdaP 03/08/2018, 03/08/2018       # Cancer screening  Colonoscopy: start age 56  Skin: recommend yearly dermatology evaluation    # Follow up:  Labs 2x/week  Visits RTC  phone visit 2 weeks, in person ~9/12      Kidney Transplant History:   Date of Transplant: 09/05/2022 (Kidney)  Type of Transplant: DDKT  KDPI: 18%  Cold ischemic time: 972 minutes  Warm ischemic time: 40 minutes  cPRA: 0-7%  HLA match: 4/6  Blood type: Donor O, Recipient O POS  ID: CMV: D-/R+ EBV: D+/R+, HCV donor serologies not immediately available (requesting this info)  Native Kidney Disease: collapsing FSGS/HIVAN (biopsy proven)    Native kidney biopsy: yes   Pre-transplant dialysis course: hemodialysis since 02/11/2015   Post-Transplant Course:    Delayed graft function requiring dialysis: no   Other complications: readmitted 7/31-8/2 for nausea/vomiting  Prior Transplants: None  Induction: thymoglobulin  Early steroid withdrawal: No (due to HIVAN)    Biopsies:   Zero-Hour Biopsy: none    History of Presenting Illness:     Since the last visit:  - BP 130s/70s-80s  - somewhat low energy     Concerns about nonadherence: No    Social: lives in [place]. Staying with his mother in [place] during recovery. On medical leave, he works as a Occupational hygienist.     Review of Systems:   A 12-system review was negative except as documented in the HPI.    Physical Exam:     BP 137/72 (BP Site: R Arm, BP Position: Sitting, BP Cuff Size: Medium)  - Pulse 84  - Temp 36.4 ??C (97.6 ??F) (Temporal)  - Ht 170.2 cm (5' 7)  - Wt 70.7 kg (155 lb 12.8 oz)  - BMI 24.40 kg/m??   Constitutional:  Well-appearing in NAD  Eyes:  anicteric sclerae  ENT:  MMM  CV:  RRR, no m/r/g, no JVD, extremities WWP with no edema  Resp:  Good air movement, CTAB  GI:  Abdomen soft, NTND, +bs  MSK:  Grossly normal, exam is limited  Skin:  Normal turgor, no rash  Neuro:  Grossly normal, exam is limited  Psych:  Normal affect  Dialysis access: LUE AVF forearm      Allergies:   Allergies   Allergen Reactions    Amlodipine Hives, Palpitations, Rash and Other (See Comments)     Causes fatigue      Doxercalciferol Hives    Fish Containing Products Hives and Shortness Of Breath     Please do not serve fish for meals. Highly allergic         Iron Sucrose Hives    Labetalol Itching and Rash    Metoclopramide Hives and Other (See Comments)     Causes ticks/ jerks    Metoprolol Hives    Nifedipine Hives, Itching and Shortness Of Breath    Other Swelling     Suture material, localized swelling at closure site    Sulfamethoxazole-Trimethoprim Rash and Other (See Comments)     Renal failure      Vancomycin Hives and Other (See Comments)     Patient states vancomycin caused kidney injury    Calcitonin     Peanut Oil Other (See Comments)    Potassium Clavulanate     Clindamycin Other (See Comments) and Palpitations     Chest pain    Hydralazine Itching and Dizziness           Sensipar [Cinacalcet] Hives        Current Medications:   Current Outpatient Medications   Medication Sig Dispense Refill    acetaminophen (TYLENOL) 160 mg/5 mL solution Take 20.3 mL (650 mg total) by mouth  every six (6) hours as needed for fever or pain. 118 mL 0    albuterol HFA 90 mcg/actuation inhaler Inhale 1-2 puffs every four (4) to six (6) hours as needed for wheezing or shortness of breath. 18 g 11    azathioprine (IMURAN) 50 mg tablet Take 3 tablets (150 mg total) by mouth daily. 90 tablet 11    bictegrav-emtricit-tenofov ala (BIKTARVY) 50-200-25 mg tablet Take 1 tablet by mouth every morning. 30 tablet 11    calamine-zinc oxide 8-8 % Lotn Apply topically four (4) times a day. 177 mL 0    carvedilol (COREG) 25 MG tablet Take 2 tablets (50 mg total) by mouth two (2) times a day. 120 tablet 11    dapsone 100 MG tablet Take 1 tablet (100 mg total) by mouth daily. START taking on 10/05/22 30 tablet 11    diphenhydrAMINE (BENADRYL) 25 mg capsule Take 1 capsule (25 mg total) by mouth every six (6) hours as needed for itching. 100 capsule 0    docusate sodium (COLACE) 100 MG capsule Take 1 capsule (100 mg total) by mouth two (2) times a day as needed for constipation. 60 capsule 11    hydrocortisone 1 % cream Apply topically two (2) times a day. 30 g 11    magnesium oxide-Mg AA chelate (MAGNESIUM, AMINO ACID CHELATE,) 133 mg Take 1 tablet by mouth two (2) times a day. Hold until directed to take by nurse coordinator 60 tablet 11    minoxidil (LONITEN) 2.5 MG tablet Take 2 tablets (5 mg total) by mouth two (2) times a day. 60 tablet 11    naloxone (NARCAN) 4 mg nasal spray SPRAY TO 1 NOSTRIL IN CASE OF OPIOID OVERDOSE, MAY REPEAT IN 2-3 MINUTES UNTIL EMS ARRIVES OR RESPONSIVE      pantoprazole (PROTONIX) 40 MG tablet Take 1 tablet (40 mg total) by mouth two (2) times a day. 60 tablet 11    predniSONE (DELTASONE) 5 MG tablet Take 1 tablet (5 mg total) by mouth in the morning. 30 tablet 11    prochlorperazine (COMPAZINE) 5 MG tablet Take 1 tablet (5 mg total) by mouth every eight (8) hours as needed for nausea. 30 tablet 0    sodium bicarbonate 650 mg tablet Take 1 tablet (650 mg total) by mouth two (2) times a day. 60 tablet 2    tacrolimus (PROGRAF) 1 MG capsule Take 3 capsules (3 mg total) by mouth two (2) times a day. Take with 5 mg capsules (for dose adjustments); on tac 8 mg twice daily 180 capsule 11    tacrolimus (PROGRAF) 5 MG capsule Take 1 capsule (5 mg total) by mouth two (2) times a day. Take with three, 1 mg capsule, twice a day for total of 8 mg twice daily 120 capsule 11    valGANciclovir (VALCYTE) 450 mg tablet Take 1 tablet (450 mg total) by mouth daily. Hold until directed to take by nurse coordinator 30 tablet 2     No current facility-administered medications for this visit.       Past Medical History:   Past Medical History:   Diagnosis Date    Acquired immunodeficiency syndrome (CMS-HCC) 11/14/2013    Formatting of this note might be different from the original.   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   He appears to be doing well. States he is  back to 90% except for his leg swelling.    Will change his epivir to 150 mg daily when he completes his liquid rx. Will see him back in 1 month and repeat his labs th    Acute deep vein thrombosis (DVT) of popliteal vein of left lower extremity (CMS-HCC) 02/05/2019    Last Assessment & Plan:   Formatting of this note might be different from the original.  Moderate effusion left knee new the some limitation of motion.  No evidence of sepsis the white count the normal  will     Proceed on with an aspiration for cell count, culture and crystal analysis.  New x-rays ordered and currently pending will follow.  Further therapeutic recommendations contingent on the re    Anemia     Asthma     Candidal esophagitis (CMS-HCC) 07/30/2022    Community acquired pneumonia 07/30/2014    DVT (deep venous thrombosis) (CMS-HCC) 11/24/2013    Last Assessment & Plan:    Formatting of this note might be different from the original.   Appears to be doing better (swelling improved). He will f/u with PCP regarding coumadin dosing (INR is low).  Formatting of this note might be different from the original.   Last Assessment & Plan:    Formatting of this note might be different from the original.   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   greatly appreciate renal f/u.  Formatting of this note might be different from the original.   Last Assessment & Plan:    greatly appreciate renal f/u.   Last Assessment & Plan:    greatly appreciate renal f/u.  Formatting of this note might be different from the original.   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        Electronically signed by:   Leafy Half, MD  [facility]

## 2022-10-06 NOTE — Unmapped (Signed)
Met w/ patient in [facility] today. Reviewed meds/symptoms. Any new medications?                 Fever/cold/flu symptoms denies  BP: 137/72 today/ Home BP reported 130/70-80s  BG: not checking  Headache/Dizziness/Lightheaded: denies  Hand tremors: reports mild tremors  Numbness/tingling: denies  Fevers/chills/sweats: denies  CP/SOB/palpatations: denies  Nausea/vomiting/heartburn: reported nausea but resolved without compazine  Diarrhea/constipation: denies  UTI symptoms (burn/pain/itch/frequency/urgency/odor/color/foam): denies  No visible or palpable edema     Appetite good; reports adequate hydration. 2 liters fluid per day.     Pt reports being well rested and getting adequate exercise.     Continues to follow Covid/health safety precautions by taking care to mask, perform frequent hand hygeine and minimal public activity. Offered support and guidance for this process given their immune-suppressed state. We discussed reduced covid vaccine coverage for transplant patients and importance of continuing to mask and practice safe distancing. Commented that booster vaccines will likely be advised as an ongoing process.     Pain: reports mild incision pain - taking oxycodone nightly  Incision: CDI with staples     Last tacrolimus taken 2100; held for this morning's labs. Current dose 8 mg daily; Azathioprine 150 mg daily, Pred 5 mg daily     Other complaints or concerns: reports itching with azathioprine, did not start dapsone yet - will start tomorrow    Referrals needed: ENT for parathyroid evaluation     Pt Follow up: GI med today and will return to clinic for IVF bolus  Surgery follow up next week  Stent removal 8/30      Immunization status: none in clinic today     Functional Score: 100     Employment/work status:  works full time - disability    Plan - zyrtec for itching  Start dapsone and valcyte  Aranesp and IVF bolus today

## 2022-10-06 NOTE — Unmapped (Addendum)
Option for itching:  - you can add cetirizine (Zyrtec) 10 mg once daily. It is a non-sedating antihistamine.     Placed referrals to:  - ENT (to resume discussion about your parathyroid  hormone; we are checking PTH today)  - Allergy (to establish baseline care, in case you have a need for desenstization to any future meds)     PJP prevention is dapsone  CMV prevention is Valcyte. If you do not tolerate this med we will evaluate for letermovir as a second option.

## 2022-10-07 LAB — BK VIRUS QUANTITATIVE PCR, BLOOD: BK BLOOD RESULT: NOT DETECTED

## 2022-10-07 LAB — CMV DNA, QUANTITATIVE, PCR: CMV VIRAL LD: NOT DETECTED

## 2022-10-08 NOTE — Unmapped (Signed)
I saw and evaluated the patient, participating in the key portions of the service.  I reviewed the fellow???s note.  I agree with the fellow???s findings and plan. 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 Scarlett Presto, MD

## 2022-10-09 DIAGNOSIS — Z7682 Awaiting organ transplant status: Principal | ICD-10-CM

## 2022-10-09 DIAGNOSIS — B20 Human immunodeficiency virus [HIV] disease: Principal | ICD-10-CM

## 2022-10-09 DIAGNOSIS — Z940 Kidney transplant status: Principal | ICD-10-CM

## 2022-10-09 LAB — CBC W/ DIFFERENTIAL
BANDED NEUTROPHILS ABSOLUTE COUNT: 0 10*3/uL (ref 0.0–0.1)
BASOPHILS ABSOLUTE COUNT: 0.1 10*3/uL (ref 0.0–0.2)
BASOPHILS RELATIVE PERCENT: 1 %
EOSINOPHILS ABSOLUTE COUNT: 0 10*3/uL (ref 0.0–0.4)
EOSINOPHILS RELATIVE PERCENT: 1 %
HEMATOCRIT: 29.4 % — ABNORMAL LOW (ref 37.5–51.0)
HEMOGLOBIN: 9.6 g/dL — ABNORMAL LOW (ref 13.0–17.7)
IMMATURE GRANULOCYTES: 1 %
LYMPHOCYTES ABSOLUTE COUNT: 0.6 10*3/uL — ABNORMAL LOW (ref 0.7–3.1)
LYMPHOCYTES RELATIVE PERCENT: 12 %
MEAN CORPUSCULAR HEMOGLOBIN CONC: 32.7 g/dL (ref 31.5–35.7)
MEAN CORPUSCULAR HEMOGLOBIN: 29.6 pg (ref 26.6–33.0)
MEAN CORPUSCULAR VOLUME: 91 fL (ref 79–97)
MONOCYTES ABSOLUTE COUNT: 0.3 10*3/uL (ref 0.1–0.9)
MONOCYTES RELATIVE PERCENT: 7 %
NEUTROPHILS ABSOLUTE COUNT: 3.9 10*3/uL (ref 1.4–7.0)
NEUTROPHILS RELATIVE PERCENT: 78 %
PLATELET COUNT: 216 10*3/uL (ref 150–450)
RED BLOOD CELL COUNT: 3.24 x10E6/uL — ABNORMAL LOW (ref 4.14–5.80)
RED CELL DISTRIBUTION WIDTH: 15.1 % (ref 11.6–15.4)
WHITE BLOOD CELL COUNT: 4.9 10*3/uL (ref 3.4–10.8)

## 2022-10-09 MED ORDER — TACROLIMUS 1 MG CAPSULE, IMMEDIATE-RELEASE
ORAL_CAPSULE | Freq: Two times a day (BID) | ORAL | 3 refills | 90 days | Status: CP
Start: 2022-10-09 — End: 2023-10-09

## 2022-10-09 MED ORDER — DAPSONE 100 MG TABLET
ORAL_TABLET | Freq: Every day | ORAL | 4 refills | 30 days | Status: CP
Start: 2022-10-09 — End: ?
  Filled 2022-10-24: qty 30, 30d supply, fill #0

## 2022-10-09 MED ORDER — MINOXIDIL 2.5 MG TABLET
ORAL_TABLET | Freq: Two times a day (BID) | ORAL | 3 refills | 45 days | Status: CP
Start: 2022-10-09 — End: ?
  Filled 2022-10-24: qty 120, 30d supply, fill #0

## 2022-10-09 MED ORDER — ACETAMINOPHEN 160 MG/5 ML ORAL LIQUID
Freq: Four times a day (QID) | ORAL | 0 refills | 2 days | Status: CP | PRN
Start: 2022-10-09 — End: ?

## 2022-10-09 MED ORDER — PANTOPRAZOLE 40 MG TABLET,DELAYED RELEASE
ORAL_TABLET | Freq: Two times a day (BID) | ORAL | 3 refills | 90 days | Status: CP
Start: 2022-10-09 — End: ?
  Filled 2022-11-06: qty 180, 90d supply, fill #0

## 2022-10-09 MED ORDER — PREDNISONE 5 MG TABLET
ORAL_TABLET | Freq: Every day | ORAL | 3 refills | 90 days | Status: CP
Start: 2022-10-09 — End: ?
  Filled 2022-10-24: qty 30, 30d supply, fill #0

## 2022-10-09 MED ORDER — DIPHENHYDRAMINE 25 MG CAPSULE
ORAL_CAPSULE | Freq: Four times a day (QID) | ORAL | 0 refills | 25 days | Status: CP | PRN
Start: 2022-10-09 — End: ?

## 2022-10-09 MED ORDER — VALGANCICLOVIR 450 MG TABLET
ORAL_TABLET | Freq: Every day | ORAL | 1 refills | 30 days | Status: CP
Start: 2022-10-09 — End: 2023-01-08

## 2022-10-09 MED ORDER — DOCUSATE SODIUM 100 MG CAPSULE
ORAL_CAPSULE | Freq: Two times a day (BID) | ORAL | 11 refills | 30 days | Status: CP | PRN
Start: 2022-10-09 — End: ?

## 2022-10-09 MED ORDER — SODIUM BICARBONATE 650 MG TABLET
ORAL_TABLET | Freq: Two times a day (BID) | ORAL | 3 refills | 90 days | Status: CP
Start: 2022-10-09 — End: ?

## 2022-10-09 MED ORDER — TACROLIMUS 5 MG CAPSULE, IMMEDIATE-RELEASE
ORAL_CAPSULE | Freq: Two times a day (BID) | ORAL | 3 refills | 180 days | Status: CP
Start: 2022-10-09 — End: ?

## 2022-10-09 MED ORDER — ALBUTEROL SULFATE HFA 90 MCG/ACTUATION AEROSOL INHALER
Freq: Four times a day (QID) | RESPIRATORY_TRACT | 11 refills | 17 days | Status: CP | PRN
Start: 2022-10-09 — End: ?

## 2022-10-09 MED ORDER — AZATHIOPRINE 50 MG TABLET
ORAL_TABLET | Freq: Every day | ORAL | 3 refills | 90 days | Status: CP
Start: 2022-10-09 — End: ?

## 2022-10-09 MED ORDER — CETIRIZINE 10 MG TABLET
ORAL_TABLET | Freq: Every day | ORAL | 11 refills | 30 days | Status: CP | PRN
Start: 2022-10-09 — End: 2023-10-09

## 2022-10-09 MED ORDER — CARVEDILOL 25 MG TABLET
ORAL_TABLET | Freq: Two times a day (BID) | ORAL | 3 refills | 90 days | Status: CP
Start: 2022-10-09 — End: ?
  Filled 2022-10-24: qty 360, 90d supply, fill #0

## 2022-10-10 DIAGNOSIS — Z940 Kidney transplant status: Principal | ICD-10-CM

## 2022-10-10 LAB — RENAL FUNCTION PANEL
ALBUMIN: 3.7 g/dL — ABNORMAL LOW (ref 4.1–5.1)
BLOOD UREA NITROGEN: 16 mg/dL (ref 6–20)
BUN / CREAT RATIO: 7 — ABNORMAL LOW (ref 9–20)
CALCIUM: 12.1 mg/dL — ABNORMAL HIGH (ref 8.7–10.2)
CHLORIDE: 114 mmol/L — ABNORMAL HIGH (ref 96–106)
CO2: 17 mmol/L — ABNORMAL LOW (ref 20–29)
CREATININE: 2.14 mg/dL — ABNORMAL HIGH (ref 0.76–1.27)
EGFR: 39 mL/min/{1.73_m2} — ABNORMAL LOW
GLUCOSE: 96 mg/dL (ref 70–99)
PHOSPHORUS, SERUM: 2 mg/dL — ABNORMAL LOW (ref 2.8–4.1)
POTASSIUM: 5 mmol/L (ref 3.5–5.2)
SODIUM: 141 mmol/L (ref 134–144)

## 2022-10-10 LAB — VITAMIN D 25 HYDROXY: VITAMIN D, TOTAL (25OH): 32.9 ng/mL (ref 20.0–80.0)

## 2022-10-10 LAB — MAGNESIUM: MAGNESIUM: 1.5 mg/dL — ABNORMAL LOW (ref 1.6–2.3)

## 2022-10-11 DIAGNOSIS — Z940 Kidney transplant status: Principal | ICD-10-CM

## 2022-10-11 LAB — CBC W/ DIFFERENTIAL
BANDED NEUTROPHILS ABSOLUTE COUNT: 0.1 10*3/uL (ref 0.0–0.1)
BASOPHILS ABSOLUTE COUNT: 0.1 10*3/uL (ref 0.0–0.2)
BASOPHILS RELATIVE PERCENT: 1 %
EOSINOPHILS ABSOLUTE COUNT: 0.1 10*3/uL (ref 0.0–0.4)
EOSINOPHILS RELATIVE PERCENT: 2 %
HEMATOCRIT: 30.1 % — ABNORMAL LOW (ref 37.5–51.0)
HEMOGLOBIN: 9.9 g/dL — ABNORMAL LOW (ref 13.0–17.7)
IMMATURE GRANULOCYTES: 2 %
LYMPHOCYTES ABSOLUTE COUNT: 0.6 10*3/uL — ABNORMAL LOW (ref 0.7–3.1)
LYMPHOCYTES RELATIVE PERCENT: 13 %
MEAN CORPUSCULAR HEMOGLOBIN CONC: 32.9 g/dL (ref 31.5–35.7)
MEAN CORPUSCULAR HEMOGLOBIN: 29.7 pg (ref 26.6–33.0)
MEAN CORPUSCULAR VOLUME: 90 fL (ref 79–97)
MONOCYTES ABSOLUTE COUNT: 0.3 10*3/uL (ref 0.1–0.9)
MONOCYTES RELATIVE PERCENT: 6 %
NEUTROPHILS ABSOLUTE COUNT: 3.7 10*3/uL (ref 1.4–7.0)
NEUTROPHILS RELATIVE PERCENT: 76 %
NUCLEATED RED BLOOD CELLS: 1 % — ABNORMAL HIGH (ref 0–0)
PLATELET COUNT: 204 10*3/uL (ref 150–450)
RED BLOOD CELL COUNT: 3.33 x10E6/uL — ABNORMAL LOW (ref 4.14–5.80)
RED CELL DISTRIBUTION WIDTH: 14.8 % (ref 11.6–15.4)
WHITE BLOOD CELL COUNT: 4.8 10*3/uL (ref 3.4–10.8)

## 2022-10-11 LAB — CMV DNA, QUANTITATIVE, PCR: CMV QUANT: NEGATIVE [IU]/mL

## 2022-10-11 LAB — TACROLIMUS LEVEL: TACROLIMUS BLOOD: 3.3 ng/mL (ref 2.0–20.0)

## 2022-10-11 NOTE — Unmapped (Signed)
Urgent referral placed for ENT r/t elevated Ca to 12.4. Left VM for Nakia at ENT scheduling to hopefully get appt ASAP.

## 2022-10-12 ENCOUNTER — Ambulatory Visit
Admit: 2022-10-12 | Discharge: 2022-10-13 | Payer: MEDICARE | Attending: Student in an Organized Health Care Education/Training Program | Primary: Student in an Organized Health Care Education/Training Program

## 2022-10-12 ENCOUNTER — Ambulatory Visit: Admit: 2022-10-12 | Discharge: 2022-10-13 | Payer: MEDICARE

## 2022-10-12 ENCOUNTER — Institutional Professional Consult (permissible substitution): Admit: 2022-10-12 | Discharge: 2022-10-13 | Payer: MEDICARE

## 2022-10-12 DIAGNOSIS — Z940 Kidney transplant status: Principal | ICD-10-CM

## 2022-10-12 LAB — RENAL FUNCTION PANEL
ALBUMIN: 3.5 g/dL — ABNORMAL LOW (ref 4.1–5.1)
BLOOD UREA NITROGEN: 13 mg/dL (ref 6–20)
BUN / CREAT RATIO: 6 — ABNORMAL LOW (ref 9–20)
CALCIUM: 12.1 mg/dL — ABNORMAL HIGH (ref 8.7–10.2)
CHLORIDE: 114 mmol/L — ABNORMAL HIGH (ref 96–106)
CO2: 18 mmol/L — ABNORMAL LOW (ref 20–29)
CREATININE: 2.33 mg/dL — ABNORMAL HIGH (ref 0.76–1.27)
EGFR: 36 mL/min/{1.73_m2} — ABNORMAL LOW
GLUCOSE: 89 mg/dL (ref 70–99)
PHOSPHORUS, SERUM: 1.9 mg/dL — ABNORMAL LOW (ref 2.8–4.1)
POTASSIUM: 5.1 mmol/L (ref 3.5–5.2)
SODIUM: 140 mmol/L (ref 134–144)

## 2022-10-12 LAB — VITAMIN D 1,25 DIHYDROXY: VITAMIN D 1,25-DIHYDROXY: 32 pg/mL

## 2022-10-12 LAB — MAGNESIUM: MAGNESIUM: 1.4 mg/dL — ABNORMAL LOW (ref 1.6–2.3)

## 2022-10-12 MED ORDER — TACROLIMUS 5 MG CAPSULE, IMMEDIATE-RELEASE
ORAL_CAPSULE | Freq: Two times a day (BID) | ORAL | 3 refills | 90 days | Status: CP
Start: 2022-10-12 — End: ?

## 2022-10-12 NOTE — Unmapped (Signed)
Patient reports ongoing rash and itching since starting azathioprine.     Please see pharmacy note for documentation.

## 2022-10-12 NOTE — Unmapped (Signed)
8/15: sched. followup per/with [name] on 9/5 or 9/13. It;s ok to use the 12:30 virtual slot if he likes that time. I called & spoke to patient per Dr. Elna Breslow inbasket request.He states he will call us back once he has his calendar. Recall created.

## 2022-10-12 NOTE — Unmapped (Signed)
Transplant Surgery post kidney transplant follow-up    Assessment/Recommendations:  He is now close to 5 weeks posttransplant, and his graft function has been stable with a creatinine of 2-2.2.  He has been having persistent hypercalcemia ever since his transplant.  He is allergic to Cinacalcet and hence that has not been a treatment option.  Pharmacy suggested trying bisphosphonates.  We would defer the decision to endocrinology/nephrology  He is also being referred to ENT for potential need for parathyroidectomy  His tac levels have been fluctuating, with the last 2 levels being 3.3 and 20.  His diet is being increased from 8 mg twice daily to 10 mg twice daily for level of 3.3.  He is scheduled for ureteric stent removal on 8/30 with urology  His staples have been removed and his wound has healed nicely.  He is under the follow-up of gastroenterology for gastritis, and they will schedule an endoscopy as part of their follow-up.    Overall he is recovering well from the kidney transplant and has good appetite and is maintaining a fluid intake of around 1.5 to 2 L.  He should not need to follow-up with transplant surgery unless there are any surgical issues.    HPI:  Christian Brown is a 42 y.o. male who presents for post renal transplant follow-up.       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 473 mL 0    albuterol HFA 90 mcg/actuation inhaler Inhale 1-2 puffs every four (4) to six (6) hours as needed for wheezing or shortness of breath. 18 g 11    azathioprine (IMURAN) 50 mg tablet Take 3 tablets (150 mg total) by mouth daily. 270 tablet 3    bictegrav-emtricit-tenofov ala (BIKTARVY) 50-200-25 mg tablet Take 1 tablet by mouth every morning. 30 tablet 11    calamine-zinc oxide 8-8 % Lotn Apply topically four (4) times a day. 177 mL 0    carvedilol (COREG) 25 MG tablet Take 2 tablets (50 mg total) by mouth two (2) times a day. 360 tablet 3    cetirizine (ZYRTEC) 10 MG tablet Take 1 tablet (10 mg total) by mouth daily as needed (Itching). 30 tablet 11    dapsone 100 MG tablet Take 1 tablet (100 mg total) by mouth daily. START taking on 10/05/22 30 tablet 4    diphenhydrAMINE (BENADRYL) 25 mg capsule Take 1 capsule (25 mg total) by mouth every six (6) hours as needed for itching. 96 capsule 0    docusate sodium (COLACE) 100 MG capsule Take 1 capsule (100 mg total) by mouth two (2) times a day as needed for constipation. 60 capsule 11    hydrocortisone 1 % cream Apply topically two (2)  times a day. 30 g 11    magnesium oxide-Mg AA chelate (MAGNESIUM, AMINO ACID CHELATE,) 133 mg Take 1 tablet by mouth two (2) times a day. Hold until directed to take by nurse coordinator 60 tablet 11    minoxidil (LONITEN) 2.5 MG tablet Take 2 tablets (5 mg total) by mouth two (2) times a day. 180 tablet 3    naloxone (NARCAN) 4 mg nasal spray SPRAY TO 1 NOSTRIL IN CASE OF OPIOID OVERDOSE, MAY REPEAT IN 2-3 MINUTES UNTIL EMS ARRIVES OR RESPONSIVE      pantoprazole (PROTONIX) 40 MG tablet Take 1 tablet (40 mg total) by mouth two (2) times a day. 180 tablet 3    predniSONE (DELTASONE) 5 MG tablet Take 1 tablet (5 mg total) by mouth in the morning. 90 tablet 3    prochlorperazine (COMPAZINE) 5 MG tablet Take 1 tablet (5 mg total) by mouth every eight (8) hours as needed for nausea. 30 tablet 0    sodium bicarbonate 650 mg tablet Take 1 tablet (650 mg total) by mouth two (2) times a day. 180 tablet 3    tacrolimus (PROGRAF) 1 MG capsule Take 3 capsules (3 mg total) by mouth two (2) times a day. Take with 5 mg capsules (for dose adjustments); on tac 8 mg twice daily 540 capsule 3    tacrolimus (PROGRAF) 5 MG capsule Take 1 capsule (5 mg total) by mouth two (2) times a day. Take with three, 1 mg capsule, twice a day for total of 8 mg twice daily 360 capsule 3    valGANciclovir (VALCYTE) 450 mg tablet Take 1 tablet (450 mg total) by mouth daily. 30 tablet 1     Current Facility-Administered Medications   Medication Dose Route Frequency Provider Last Rate Last Admin    sodium chloride 0.9 % (NS) bag 1,000 mL  1,000 mL Intravenous Once Leafy Half, MD   Stopped at 10/06/22 1550         Past Medical History  Past Medical History:   Diagnosis Date    Acquired immunodeficiency syndrome (CMS-HCC) 11/14/2013    Formatting of this note might be different from the original.   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   He appears to be doing well. States he is back to 90% except for his leg swelling.    Will change his epivir to 150 mg daily when he completes his liquid rx. Will see him back in 1 month and repeat his labs th    Acute deep vein thrombosis (DVT) of popliteal vein of left lower extremity (CMS-HCC) 02/05/2019    Last Assessment & Plan:   Formatting of this note might be different from the original.  Moderate effusion left knee new the some limitation of motion.  No evidence of sepsis the white count the normal  will     Proceed on with an aspiration for cell count, culture and crystal analysis.  New x-rays ordered and currently pending will follow.  Further therapeutic recommendations contingent on the re    Anemia     Asthma     Candidal esophagitis (CMS-HCC) 07/30/2022    Community acquired pneumonia 07/30/2014    DVT (deep venous thrombosis) (CMS-HCC) 11/24/2013    Last Assessment & Plan:    Formatting of this note might be different from the original.   Appears to be  doing better (swelling improved). He will f/u with PCP regarding coumadin dosing (INR is low).  Formatting of this note might be different from the original.   Last Assessment & Plan:    Formatting of this note might be different from the original.   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   greatly appreciate renal f/u.  Formatting of this note might be different from the original.   Last Assessment & Plan:    greatly appreciate renal f/u.   Last Assessment & Plan:    greatly appreciate renal f/u.  Formatting of this note might be different from the original.   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    Rectal bleeding 04/23/2019    Sleep apnea          Past Surgical History  Past Surgical History:   Procedure Laterality Date    INTUSSUSCEPTION REPAIR      PERICARDIAL WINDOW      PR AUTOTRANSPLANT, PARATHYROID Bilateral 06/17/2019    Procedure: PARATHYROID AUTOTRANSPLANTATION;  Surgeon: Karren Burly, MD;  Location: MAIN OR [place];  Service: ENT    PR CREAT AV FISTULA,NON-AUTOGENOUS GRAFT Left 07/05/2022    Procedure: CREATE AV FISTULA (SEPARATE PROC); NONAUTOGENOUS GRAFT (EG, BIOLOGICAL COLLAGEN, THERMOPLASTIC GRAFT), UPPER EXTREMITY;  Surgeon: Toledo, Lilyan Punt, MD;  Location: MAIN OR [facility];  Service: Transplant    PR EXCISION TURBINATE,SUBMUCOUS Bilateral 10/22/2020    Procedure: SUBMUCOUS RESECTION INFERIOR TURBINATE, PARTIAL OR COMPLETE, ANY METHOD;  Surgeon: Maeola Sarah, MD;  Location: OR [facility];  Service: ENT    PR EXPLORE PARATHYROID GLANDS Bilateral 06/17/2019    Procedure: PARATHYROIDECTOMY OR EXPLORATION OF PARATHYROID(S);  Surgeon: Karren Burly, MD;  Location: MAIN OR [facility];  Service: ENT    PR REPAIR OF NASAL SEPTUM N/A 10/22/2020    Procedure: SEPTOPLASTY OR SUBMUCOUS RESECTION, WITH OR WITHOUT CARTILAGE SCORING, CONTOURING OR REPLACEMENT WITH GRAFT;  Surgeon: Maeola Sarah, MD;  Location: OR [facility];  Service: ENT    PR REVISE AV FISTULA,W/O THROMBECTOMY Left 06/06/2022    Procedure: REVISON, OPEN, ARTERIOVENOUS FISTULA; WITHOUT THROMBECTOMY, AUTOGENOUS OR NONAUTOGENOUS DIALYSIS GRAFT;  Surgeon: Toledo, Lilyan Punt, MD;  Location: MAIN OR [facility];  Service: Transplant    PR REVISE AV FISTULA,W/O THROMBECTOMY Left 07/05/2022    Procedure: REVISON, OPEN, ARTERIOVENOUS FISTULA; WITHOUT THROMBECTOMY, AUTOGENOUS OR NONAUTOGENOUS DIALYSIS GRAFT;  Surgeon: Toledo, Lilyan Punt, MD;  Location: MAIN OR [facility];  Service: Transplant    PR RIGHT HEART CATH O2 SATURATION & CARDIAC OUTPUT N/A 07/20/2020    Procedure: Right Heart Catheterization with PULMONARY ANGIOGRAM;  Surgeon: Marlaine Hind, MD;  [facility];  Service: Cardiology    PR TRANSPLANT,PREP RENAL GRAFT/ARTERIAL N/A 09/05/2022    Procedure: [facility] RECONSTRUCTION CADAVER/LIVING DONOR RENAL ALLOGRAFT PRIOR TO TRANSPLANT; ARTERIAL  ANASTOMOSIS EAC;  Surgeon: Toledo, Lilyan Punt, MD;  Location: MAIN OR [facility]; Service: Transplant    PR TRANSPLANTATION OF KIDNEY N/A 09/05/2022    Procedure: RENAL ALLOTRANSPLANTATION, IMPLANTATION OF GRAFT; WITHOUT RECIPIENT NEPHRECTOMY;  Surgeon: Toledo, Lilyan Punt, MD;  Location: MAIN OR [facility];  Service: Transplant    PR UPPER GI ENDOSCOPY,BIOPSY N/A 07/31/2022    Procedure: UGI ENDOSCOPY; WITH BIOPSY, SINGLE OR MULTIPLE;  Surgeon: Janace Aris, MD;  [facility]: GI [facility];  Service: Gastroenterology    PR UPPER GI ENDOSCOPY,BIOPSY N/A 09/12/2022    Procedure: UGI ENDOSCOPY; WITH BIOPSY, SINGLE OR MULTIPLE;  Surgeon: Zetta Bills, MD;  Location: GI [facility]URES [facility];  Service: Gastroenterology    UMBILICAL HERNIA REPAIR           Review of Systems  A 12 system review of systems was negative except as noted in HPI    PE: Blood pressure 133/85, pulse 89, temperature 36.7 ??C (98.1 ??F), temperature source Tympanic, height 170.2 cm (5' 7.01), weight 72.8 kg (160 lb 8 oz). Body mass index is 25.13 kg/m??.    Constitutional:  Well-appearing in NAD  Eyes:  anicteric sclerae  ENT:  MMM  CV:  RRR, no m/r/g, no JVD, extremities WWP with no edema  Resp:  Good air movement, CTAB  GI:  Abdomen soft, NTND, +bs  MSK:  Grossly normal, exam is limited  Skin:  Normal turgor, no rash  Neuro:  Grossly normal, exam is limited  Psych:  Normal affect  Dialysis access: LUE AVF forearm    Test Results  Lab Results   Component Value Date    WBC 4.8 10/11/2022    HGB 9.9 (L) 10/11/2022    HCT 30.1 (L) 10/11/2022    PLT 204 10/11/2022     Lab Results   Component Value Date    NA 140 10/11/2022    K 5.1 10/11/2022    CL 114 (H) 10/11/2022    CO2 18 (L) 10/11/2022    BUN 13 10/11/2022    CREATININE 2.33 (H) 10/11/2022    CALCIUM 12.1 (H) 10/11/2022    MG 1.4 (L) 10/11/2022    PHOS 1.9 (L) 10/06/2022     Lab Results   Component Value Date    ALKPHOS 329 (H) 09/29/2022    BILITOT 0.5 09/29/2022    BILIDIR 0.20 01/17/2022    PROT 6.1 09/29/2022    ALBUMIN 3.4 10/06/2022    ALT <7 (L) 09/29/2022    AST 10 09/29/2022    GGT 15 09/05/2022     Lab Results   Component Value Date    INR 0.97 09/05/2022    APTT 39.9 (H) 09/05/2022       Dr Mathis Dad  Fellow Transplant Division  Department of Surgery  [facility] [place]  Page - [phone]

## 2022-10-12 NOTE — Unmapped (Signed)
Transplant Coordinator, Clinic Visit   Pt seen today by transplant nephrology for follow up, reviewed medications and symptoms.   Met with pt and mother today during clinic visit. PT doing well. PT staples removed in clinic.   Per [name]- increase tac to 10 mg BID- pt agrees to this.  Pt was asked to start taking alendronate once per week for calcium- he refuses to do this. He is concerned that he may be allergic to this medication and doesn't think it has been studied enough. He states that he will wait until he sees ENT to decide how to treat calcium. He states that he will make an appt with ENT when he knows his schedule better.     Assessment  BP: 133/85  Lightheaded: denies  Headache: denies  Hand tremors: denies  Numbness/tingling: denies  Fevers: denies  Chills/sweats: denies  Shortness of breath: denies  Chest pain or pressure: denies  Palpitations: denies  Abdominal pain: denies  Heart burn: denies  Nausea/vomiting: denies  Diarrhea/constipation: denies  UTI symptoms: denies  Swelling: denies  Incision: staples removed, healing well, no signs of infection    Good appetite; reports adequate hydration.     Intake: 1.5-2 liters water per day    Functional Score: 100   Normal no complaints; no evidence of  disease.     I spent a total of 24 minutes with Christian Brown reviewing medications and symptoms.

## 2022-10-13 LAB — CBC W/ DIFFERENTIAL
BANDED NEUTROPHILS ABSOLUTE COUNT: 0.1 10*3/uL (ref 0.0–0.1)
BASOPHILS ABSOLUTE COUNT: 0.1 10*3/uL (ref 0.0–0.2)
BASOPHILS RELATIVE PERCENT: 1 %
EOSINOPHILS ABSOLUTE COUNT: 0 10*3/uL (ref 0.0–0.4)
EOSINOPHILS RELATIVE PERCENT: 1 %
HEMATOCRIT: 30.6 % — ABNORMAL LOW (ref 37.5–51.0)
HEMOGLOBIN: 10.1 g/dL — ABNORMAL LOW (ref 13.0–17.7)
IMMATURE GRANULOCYTES: 1 %
LYMPHOCYTES ABSOLUTE COUNT: 0.5 10*3/uL — ABNORMAL LOW (ref 0.7–3.1)
LYMPHOCYTES RELATIVE PERCENT: 10 %
MEAN CORPUSCULAR HEMOGLOBIN CONC: 33 g/dL (ref 31.5–35.7)
MEAN CORPUSCULAR HEMOGLOBIN: 30.4 pg (ref 26.6–33.0)
MEAN CORPUSCULAR VOLUME: 92 fL (ref 79–97)
MONOCYTES ABSOLUTE COUNT: 0.4 10*3/uL (ref 0.1–0.9)
MONOCYTES RELATIVE PERCENT: 9 %
NEUTROPHILS ABSOLUTE COUNT: 3.6 10*3/uL (ref 1.4–7.0)
NEUTROPHILS RELATIVE PERCENT: 78 %
NUCLEATED RED BLOOD CELLS: 1 % — ABNORMAL HIGH (ref 0–0)
PLATELET COUNT: 194 10*3/uL (ref 150–450)
RED BLOOD CELL COUNT: 3.32 x10E6/uL — ABNORMAL LOW (ref 4.14–5.80)
RED CELL DISTRIBUTION WIDTH: 15.4 % (ref 11.6–15.4)
WHITE BLOOD CELL COUNT: 4.7 10*3/uL (ref 3.4–10.8)

## 2022-10-13 LAB — HLA DS POST TRANSPLANT
ANTI-DONOR DRW #1 MFI: 83 MFI
ANTI-DONOR DRW #2 MFI: 70 MFI
ANTI-DONOR HLA-A #1 MFI: 63 MFI
ANTI-DONOR HLA-A #2 MFI: 72 MFI
ANTI-DONOR HLA-B #1 MFI: 920 MFI
ANTI-DONOR HLA-B #2 MFI: 79 MFI
ANTI-DONOR HLA-C #1 MFI: 137 MFI
ANTI-DONOR HLA-DP AG #1 MFI: 36 MFI
ANTI-DONOR HLA-DQB #1 MFI: 7 MFI
ANTI-DONOR HLA-DQB #2 MFI: 8511 MFI — ABNORMAL HIGH
ANTI-DONOR HLA-DR #1 MFI: 38 MFI
ANTI-DONOR HLA-DR #2 MFI: 86 MFI

## 2022-10-13 LAB — FSAB CLASS 2 ANTIBODY SPECIFICITY: HLA CL2 AB RESULT: POSITIVE

## 2022-10-13 LAB — FSAB CLASS 1 ANTIBODY SPECIFICITY: HLA CLASS 1 ANTIBODY RESULT: POSITIVE

## 2022-10-13 LAB — TACROLIMUS LEVEL: TACROLIMUS BLOOD: 6.6 ng/mL (ref 2.0–20.0)

## 2022-10-14 LAB — RENAL FUNCTION PANEL
ALBUMIN: 3.8 g/dL — ABNORMAL LOW (ref 4.1–5.1)
BLOOD UREA NITROGEN: 15 mg/dL (ref 6–20)
BUN / CREAT RATIO: 6 — ABNORMAL LOW (ref 9–20)
CALCIUM: 12.3 mg/dL — ABNORMAL HIGH (ref 8.7–10.2)
CHLORIDE: 113 mmol/L — ABNORMAL HIGH (ref 96–106)
CO2: 17 mmol/L — ABNORMAL LOW (ref 20–29)
CREATININE: 2.34 mg/dL — ABNORMAL HIGH (ref 0.76–1.27)
EGFR: 35 mL/min/{1.73_m2} — ABNORMAL LOW
GLUCOSE: 94 mg/dL (ref 70–99)
PHOSPHORUS, SERUM: 1.9 mg/dL — ABNORMAL LOW (ref 2.8–4.1)
POTASSIUM: 4.9 mmol/L (ref 3.5–5.2)
SODIUM: 141 mmol/L (ref 134–144)

## 2022-10-14 LAB — MAGNESIUM: MAGNESIUM: 1.5 mg/dL — ABNORMAL LOW (ref 1.6–2.3)

## 2022-10-15 DIAGNOSIS — Z940 Kidney transplant status: Principal | ICD-10-CM

## 2022-10-15 DIAGNOSIS — B9735 Human immunodeficiency virus, type 2 [HIV 2] as the cause of diseases classified elsewhere: Principal | ICD-10-CM

## 2022-10-15 DIAGNOSIS — N186 End stage renal disease: Principal | ICD-10-CM

## 2022-10-15 LAB — TACROLIMUS LEVEL: TACROLIMUS BLOOD: 5.8 ng/mL (ref 2.0–20.0)

## 2022-10-16 DIAGNOSIS — Z940 Kidney transplant status: Principal | ICD-10-CM

## 2022-10-16 LAB — CBC W/ DIFFERENTIAL
BANDED NEUTROPHILS ABSOLUTE COUNT: 0 10*3/uL (ref 0.0–0.1)
BASOPHILS ABSOLUTE COUNT: 0 10*3/uL (ref 0.0–0.2)
BASOPHILS RELATIVE PERCENT: 1 %
EOSINOPHILS ABSOLUTE COUNT: 0.1 10*3/uL (ref 0.0–0.4)
EOSINOPHILS RELATIVE PERCENT: 2 %
HEMATOCRIT: 31.2 % — ABNORMAL LOW (ref 37.5–51.0)
HEMOGLOBIN: 10.3 g/dL — ABNORMAL LOW (ref 13.0–17.7)
IMMATURE GRANULOCYTES: 1 %
LYMPHOCYTES ABSOLUTE COUNT: 0.6 10*3/uL — ABNORMAL LOW (ref 0.7–3.1)
LYMPHOCYTES RELATIVE PERCENT: 14 %
MEAN CORPUSCULAR HEMOGLOBIN CONC: 33 g/dL (ref 31.5–35.7)
MEAN CORPUSCULAR HEMOGLOBIN: 29.9 pg (ref 26.6–33.0)
MEAN CORPUSCULAR VOLUME: 90 fL (ref 79–97)
MONOCYTES ABSOLUTE COUNT: 0.4 10*3/uL (ref 0.1–0.9)
MONOCYTES RELATIVE PERCENT: 8 %
NEUTROPHILS ABSOLUTE COUNT: 3.6 10*3/uL (ref 1.4–7.0)
NEUTROPHILS RELATIVE PERCENT: 74 %
NUCLEATED RED BLOOD CELLS: 1 % — ABNORMAL HIGH (ref 0–0)
PLATELET COUNT: 161 10*3/uL (ref 150–450)
RED BLOOD CELL COUNT: 3.45 x10E6/uL — ABNORMAL LOW (ref 4.14–5.80)
RED CELL DISTRIBUTION WIDTH: 15.4 % (ref 11.6–15.4)
WHITE BLOOD CELL COUNT: 4.7 10*3/uL (ref 3.4–10.8)

## 2022-10-17 ENCOUNTER — Ambulatory Visit: Admit: 2022-10-17 | Discharge: 2022-10-18 | Payer: MEDICARE

## 2022-10-17 DIAGNOSIS — Z79899 Other long term (current) drug therapy: Principal | ICD-10-CM

## 2022-10-17 DIAGNOSIS — Z940 Kidney transplant status: Principal | ICD-10-CM

## 2022-10-17 LAB — RENAL FUNCTION PANEL
ALBUMIN: 3.8 g/dL — ABNORMAL LOW (ref 4.1–5.1)
BLOOD UREA NITROGEN: 18 mg/dL (ref 6–20)
BUN / CREAT RATIO: 8 — ABNORMAL LOW (ref 9–20)
CALCIUM: 11.8 mg/dL — ABNORMAL HIGH (ref 8.7–10.2)
CHLORIDE: 112 mmol/L — ABNORMAL HIGH (ref 96–106)
CO2: 18 mmol/L — ABNORMAL LOW (ref 20–29)
CREATININE: 2.39 mg/dL — ABNORMAL HIGH (ref 0.76–1.27)
EGFR: 35 mL/min/{1.73_m2} — ABNORMAL LOW
GLUCOSE: 87 mg/dL (ref 70–99)
PHOSPHORUS, SERUM: 2.4 mg/dL — ABNORMAL LOW (ref 2.8–4.1)
POTASSIUM: 5.2 mmol/L (ref 3.5–5.2)
SODIUM: 138 mmol/L (ref 134–144)

## 2022-10-17 LAB — MAGNESIUM: MAGNESIUM: 1.4 mg/dL — ABNORMAL LOW (ref 1.6–2.3)

## 2022-10-17 LAB — TACROLIMUS LEVEL: TACROLIMUS BLOOD: 8.9 ng/mL (ref 2.0–20.0)

## 2022-10-17 MED ORDER — AZATHIOPRINE 100 MG TABLET
ORAL_TABLET | Freq: Every day | ORAL | 11 refills | 30 days | Status: CP
Start: 2022-10-17 — End: 2023-10-17
  Filled 2022-10-24: qty 60, 30d supply, fill #0

## 2022-10-17 NOTE — Unmapped (Signed)
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 We will need to adjust immunosuppression. He is at high rejection risk based on several factors (background of HIV, unable to tolerate standard of care MMF, and now with de novo DSA).     3 options:  - recommended: start IVIG 0.5 g/kg once monthly (outpatient). Patient declines due to wish to avoid a new medication  - not recommended: switch back from azathioprine to MMF. He is on azathioprine due to MMF was a possible but not confirmable contributor to his severe reflux with nausea/vomiting. EGD showed esophagitis, path was nonspecific esophagitis with negative viral/fungal/H pylori/dysplasia findings.   - patient preferred and is reasonable: increase azathioprine to 200 mg daily and follow up the DSA and other clinical findings.     Plan:  - azathioprine increase to 200 mg daily  - labs to 2x/week (Monday/Thursday)  - need to identify ID followup plan (pt has an established outpatient ID physician, Dr. Daiva Eves in [place])  - nephro followup will be Sept 13 at 1:30 pm

## 2022-10-17 NOTE — Unmapped (Unsigned)
The patient reports they are physically located in [place] and is currently: at home. I conducted a phone visit.  I spent 30 minutes on the phone call with the patient on the date of service .       **THIS PATIENT WAS NOT SEEN IN PERSON TO MINIMIZE POTENTIAL SPREAD OF COVID-19, PROTECT PATIENTS/PROVIDERS, AND REDUCE PPE UTILIZATION.**    PATIENT NAME: Christian Brown     MR#: [id number]    DOB: [birth year]    [facility]  CONFIDENTIAL SOCIAL WORK   KIDNEY POST TRANSPLANT FOLLOW UP      DATE OF EVALUATION: 10/17/2022    INFORMANTS: Patient/Christian Brown    PREFERRED LANGUAGE: English     INTERPRETER UTILIZED: N/A    TXP CARE TEAM:   Post Transplant RN Coordinator: Celene Squibb [phone]  Primary Transplant Provider: Montine Circle Brown, Christian Brown, Christian Brown, Christian Brown, Christian Brown, Christian Brown, Christian Brown, Christian Brown    REFERRAL INFORMATION:    Mr.  Brown is a 42 y.o. African-American male is s/p transplant for kidney transplantation . CSW follows up to assess recovery since DC.    TRANSPLANT DATE:   09/05/2022 (Kidney)    MOST RECENT HOSPITAL ADMISSION (@ [facility]):   Previous admit date: 09/27/2022 to 09/29/22    FUTURE APPOINTMENTS (@ [place]):   Future Appointments   Date Time Provider Department Center   11/02/2022 12:30 PM Arant, Dorise Hiss, MD [facility]   11/02/2022  3:00 PM Christian Maria Francine Graven, MD [facility]   11/28/2022  1:00 PM Christian Chess, MD [facility]   11/30/2022 10:00 AM Arant, Dorise Hiss, MD [facility]       HOME HEALTH/DME NEEDS AT LAST DC:   HH: N/A   Services: N/A   Contact: n/a  DME: N/A  Other: N/A  Other Remaining:   Ureter stent: Yes, 8/30  Staples: Yes  Surgical drains x0: No  PD Cath: N/A    LIFESTYLE:  Physical activity:  Good  Nutrition/Appetite:  Good  Sleep: Fair; up and down, lots of bathroom trips    SOCIAL HISTORY:  Citizenship Status: [place] Citizen   Marital Status: single  Lives with: lives with mom part-time (has same address as mom, but she is a traveling Scientist, forensic, a Environmental consultant)  Children/Dependents:  none   Social support: mom, aunt, local friends  Housing: no concerns with housing, reports housing stability      CAREGIVING PLAN:  Location:  at home w/ mother  Support/Caregivers:  mother  Household tasks/errands:  mother  Medication:  mother  Transportation: mother    COMPLIANCE HISTORY:  Medication Adherence: Good  Medication Concerns: denied problems taking medications, concerns about side effects, affordability, problems obtaining medications, and difficulty remembering medications  Other Adherence: Good     Side Effects: none    INSURANCE:  [employer] assistance: N/A  Payer/Plan Subscriber Name Rel Member # Group #   MEDICARE - MEDICARE P* Christian Brown Self [id number]       [address]   MEDICAID [place], Brown Self [id number]       [address] [address]     Dialysis Start Date:  02/11/2015     SSDI eligibility: 2/2 ESRD per patient report    ESRD Medicare eligibility: 09/05/22; 08/28/22-08/26/25    65th BD:  01/22/2048    30 mon COB period: N/A; expired    Access to other insurance plans?: unknown    INCOME:   Current income sources: SSDI 2/2 ESRD; $1800  Monthly expenses: rent, utilities, car payment x1 (reliable x1), medication co-pays ($5/month), other customary bills  Financial Risks and Debts: hospital bills   Current income meets basic needs: yes, but managing with limited resources, shares expenses with mom     Highest completed grade level: some college   Currently employed: none   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, music History: not discussed today     ATTITUDE ABOUT TRANSPLANT:  Expectations: was not prepared for gastritis after txp; was not prepared for itching  Fears/Concerns: denies  What would you change?:  denies  Rec'd Info on Ltr to Donor Family: yes    MENTAL HEALTH HISTORY: reflective of current   Current issues/mood: denies  Medications: denies  Therapy: denies  SI/HI: denies    SUBSTANCE HISTORY: reflective of current   Tobacco: denies  Alcohol: denies  Illicit Substances: denies  OTC/Supplements: denies    PAIN HISTORY: reflective of current  Current : some pain still  Current use of pain medication/pain control: took oxycotin today before getting in car to ride to labs    SUMMARY:  Spoke w/ pt at appt time.  Pt states that his staples and stent remain, but he is hopeful that they will be out soon.  We discussed his current benefits at length and we discussed the impact of txp on those benefits.      No specific questions at this time.            RECOMMENDATIONS:   1. General monitoring  2. F/up 2-3 weeks        Christian Petties, LCSW, CCTSW  Transplant Case Manager/Social Worker  [facility] for Transplant Care  Completed: 11/02/22 II:65551::denies}  Alcohol: {jenn denies admits wild II:65551::denies}  Illicit Substances: {jenn denies admits wild II:65551::denies}  OTC/Supplements: {jenn denies admits wild II:65551::denies}    Denies         PAIN HISTORY: reflective of current  Current : ***  Current use of pain medication/pain control: ***    Took before getting in car; oxy...      SUMMARY:  ***      Anticipatory guidance/education provided on the following:  --Possibility of readmissions, unplanned clinic visits, extra lab work  --Possibility of mood changes, including when to call providers  --Gradual decline in frequency of clinic visits and lab work over time  --Importance of communication with txp team  --Caution to talk w/ txp team before taking any other prescriptions or OTC medications (incl vitamins, supplements. herbal teas)  --Additional infection control precautions during acute recovery phase (and ongoing)  --Additional physical & lifting restrictions (including driving) during acute recovery phase  --Normalization of complexity of care, frequently changing medications, and other details to keep organized  --Normalization of need for support/assistance post transplant for ALL patients  --Normalization of frequent urge/need to urinate, including possibility of incontinent accidents   --Encouragement to drink fluids as directed; normalization of difficulty at times with sudden increase in recommendations  --Verified pt/family has correct contact information for this CSW        RECOMMENDATIONS:   1. ***  2. F/up *** weeks    ________________      Q 2-3 weeks..    Need to go over ESRD Medicare benefits            ***,  LCSW, CCTSW  Transplant Case Manager/Social Worker  [facility] for Transplant Care  Completed: 10/17/22        Cherlynn Polo still in  Stent coming out 8/30....  Stomach better...    Still drinking water...    Explained Medicare rules...       No questions...    F/up q 2 weeks.Marland KitchenMarland Kitchen

## 2022-10-18 LAB — CMV DNA, QUANTITATIVE, PCR: CMV QUANT: NEGATIVE [IU]/mL

## 2022-10-19 DIAGNOSIS — Z940 Kidney transplant status: Principal | ICD-10-CM

## 2022-10-19 LAB — CBC W/ DIFFERENTIAL
BANDED NEUTROPHILS ABSOLUTE COUNT: 0 10*3/uL (ref 0.0–0.1)
BASOPHILS ABSOLUTE COUNT: 0 10*3/uL (ref 0.0–0.2)
BASOPHILS RELATIVE PERCENT: 1 %
EOSINOPHILS ABSOLUTE COUNT: 0.1 10*3/uL (ref 0.0–0.4)
EOSINOPHILS RELATIVE PERCENT: 4 %
HEMATOCRIT: 31.6 % — ABNORMAL LOW (ref 37.5–51.0)
HEMOGLOBIN: 10.2 g/dL — ABNORMAL LOW (ref 13.0–17.7)
IMMATURE GRANULOCYTES: 1 %
LYMPHOCYTES ABSOLUTE COUNT: 0.5 10*3/uL — ABNORMAL LOW (ref 0.7–3.1)
LYMPHOCYTES RELATIVE PERCENT: 14 %
MEAN CORPUSCULAR HEMOGLOBIN CONC: 32.3 g/dL (ref 31.5–35.7)
MEAN CORPUSCULAR HEMOGLOBIN: 30 pg (ref 26.6–33.0)
MEAN CORPUSCULAR VOLUME: 93 fL (ref 79–97)
MONOCYTES ABSOLUTE COUNT: 0.3 10*3/uL (ref 0.1–0.9)
MONOCYTES RELATIVE PERCENT: 8 %
NEUTROPHILS ABSOLUTE COUNT: 2.4 10*3/uL (ref 1.4–7.0)
NEUTROPHILS RELATIVE PERCENT: 72 %
PLATELET COUNT: 117 10*3/uL — ABNORMAL LOW (ref 150–450)
RED BLOOD CELL COUNT: 3.4 x10E6/uL — ABNORMAL LOW (ref 4.14–5.80)
RED CELL DISTRIBUTION WIDTH: 15.9 % — ABNORMAL HIGH (ref 11.6–15.4)
WHITE BLOOD CELL COUNT: 3.4 10*3/uL (ref 3.4–10.8)

## 2022-10-20 DIAGNOSIS — N179 Acute kidney failure, unspecified: Principal | ICD-10-CM

## 2022-10-20 DIAGNOSIS — Z940 Kidney transplant status: Principal | ICD-10-CM

## 2022-10-20 LAB — RENAL FUNCTION PANEL
ALBUMIN: 3.7 g/dL — ABNORMAL LOW (ref 4.1–5.1)
BLOOD UREA NITROGEN: 19 mg/dL (ref 6–20)
BUN / CREAT RATIO: 8 — ABNORMAL LOW (ref 9–20)
CALCIUM: 11.2 mg/dL — ABNORMAL HIGH (ref 8.7–10.2)
CHLORIDE: 114 mmol/L — ABNORMAL HIGH (ref 96–106)
CO2: 18 mmol/L — ABNORMAL LOW (ref 20–29)
CREATININE: 2.48 mg/dL — ABNORMAL HIGH (ref 0.76–1.27)
EGFR: 33 mL/min/{1.73_m2} — ABNORMAL LOW
GLUCOSE: 91 mg/dL (ref 70–99)
PHOSPHORUS, SERUM: 2.9 mg/dL (ref 2.8–4.1)
POTASSIUM: 5.1 mmol/L (ref 3.5–5.2)
SODIUM: 142 mmol/L (ref 134–144)

## 2022-10-20 LAB — MAGNESIUM: MAGNESIUM: 1.5 mg/dL — ABNORMAL LOW (ref 1.6–2.3)

## 2022-10-20 NOTE — Unmapped (Signed)
Reviewed recent labs and increased creatinine with [name].  Pt agreed to scheduling a kidney biopsy for next Friday 8/30.  He will repeat labs on Monday.

## 2022-10-21 LAB — TACROLIMUS LEVEL: TACROLIMUS BLOOD: 11.4 ng/mL (ref 2.0–20.0)

## 2022-10-23 DIAGNOSIS — Z940 Kidney transplant status: Principal | ICD-10-CM

## 2022-10-23 LAB — CBC W/ DIFFERENTIAL
BANDED NEUTROPHILS ABSOLUTE COUNT: 0 10*3/uL (ref 0.0–0.1)
BASOPHILS ABSOLUTE COUNT: 0 10*3/uL (ref 0.0–0.2)
BASOPHILS RELATIVE PERCENT: 1 %
EOSINOPHILS ABSOLUTE COUNT: 0.1 10*3/uL (ref 0.0–0.4)
EOSINOPHILS RELATIVE PERCENT: 4 %
HEMATOCRIT: 31.9 % — ABNORMAL LOW (ref 37.5–51.0)
HEMOGLOBIN: 10.4 g/dL — ABNORMAL LOW (ref 13.0–17.7)
IMMATURE GRANULOCYTES: 0 %
LYMPHOCYTES ABSOLUTE COUNT: 0.3 10*3/uL — ABNORMAL LOW (ref 0.7–3.1)
LYMPHOCYTES RELATIVE PERCENT: 10 %
MEAN CORPUSCULAR HEMOGLOBIN CONC: 32.6 g/dL (ref 31.5–35.7)
MEAN CORPUSCULAR HEMOGLOBIN: 30.4 pg (ref 26.6–33.0)
MEAN CORPUSCULAR VOLUME: 93 fL (ref 79–97)
MONOCYTES ABSOLUTE COUNT: 0.3 10*3/uL (ref 0.1–0.9)
MONOCYTES RELATIVE PERCENT: 11 %
NEUTROPHILS ABSOLUTE COUNT: 2.3 10*3/uL (ref 1.4–7.0)
NEUTROPHILS RELATIVE PERCENT: 74 %
PLATELET COUNT: 121 10*3/uL — ABNORMAL LOW (ref 150–450)
RED BLOOD CELL COUNT: 3.42 x10E6/uL — ABNORMAL LOW (ref 4.14–5.80)
RED CELL DISTRIBUTION WIDTH: 16.1 % — ABNORMAL HIGH (ref 11.6–15.4)
WHITE BLOOD CELL COUNT: 3.1 10*3/uL — ABNORMAL LOW (ref 3.4–10.8)

## 2022-10-23 MED ORDER — TACROLIMUS 5 MG CAPSULE, IMMEDIATE-RELEASE
ORAL_CAPSULE | Freq: Two times a day (BID) | ORAL | 3 refills | 180.00000 days | Status: CP
Start: 2022-10-23 — End: ?
  Filled 2022-10-24: qty 120, 30d supply, fill #0

## 2022-10-23 NOTE — Unmapped (Signed)
[facility] Clinical Assessment & Refill Coordination Note    Christian Brown, DOB: 1980/08/22  Phone: [phone] (home)     All above HIPAA information was verified with patient.     Was a Nurse, learning disability used for this call? No    Specialty Medication(s):   Infectious Disease: Biktarvy and Transplant: tacrolimus 5mg , valgancyclovir 450mg , Prednisone 5mg , and Azathioprine 100mg      Current Outpatient Medications   Medication Sig Dispense Refill    acetaminophen (TYLENOL) 160 mg/5 mL solution Take 20.3 mL (650 mg total) by mouth every six (6) hours as needed for fever or pain. 473 mL 0    albuterol HFA 90 mcg/actuation inhaler Inhale 1-2 puffs every four (4) to six (6) hours as needed for wheezing or shortness of breath. 18 g 11    azathioprine (AZASAN) 100 mg tablet Take 2 tablets (200 mg total) by mouth daily. 60 tablet 11    bictegrav-emtricit-tenofov ala (BIKTARVY) 50-200-25 mg tablet Take 1 tablet by mouth every morning. 30 tablet 11    calamine-zinc oxide 8-8 % Lotn Apply topically four (4) times a day. 177 mL 0    carvedilol (COREG) 25 MG tablet Take 2 tablets (50 mg total) by mouth two (2) times a day. 360 tablet 3    cetirizine (ZYRTEC) 10 MG tablet Take 1 tablet (10 mg total) by mouth daily as needed (Itching). 30 tablet 11    dapsone 100 MG tablet Take 1 tablet (100 mg total) by mouth daily. START taking on 10/05/22 30 tablet 4    diphenhydrAMINE (BENADRYL) 25 mg capsule Take 1 capsule (25 mg total) by mouth every six (6) hours as needed for itching. 96 capsule 0    docusate sodium (COLACE) 100 MG capsule Take 1 capsule (100 mg total) by mouth two (2) times a day as needed for constipation. 60 capsule 11    hydrocortisone 1 % cream Apply topically two (2) times a day. 30 g 11    magnesium oxide-Mg AA chelate (MAGNESIUM, AMINO ACID CHELATE,) 133 mg Take 1 tablet by mouth two (2) times a day. Hold until directed to take by nurse coordinator 60 tablet 11    minoxidil (LONITEN) 2.5 MG tablet Take 2 tablets (5 mg total) by mouth two (2) times a day. 180 tablet 3    naloxone (NARCAN) 4 mg nasal spray SPRAY TO 1 NOSTRIL IN CASE OF OPIOID OVERDOSE, MAY REPEAT IN 2-3 MINUTES UNTIL EMS ARRIVES OR RESPONSIVE      pantoprazole (PROTONIX) 40 MG tablet Take 1 tablet (40 mg total) by mouth two (2) times a day. 180 tablet 3    predniSONE (DELTASONE) 5 MG tablet Take 1 tablet (5 mg total) by mouth in the morning. 90 tablet 3    prochlorperazine (COMPAZINE) 5 MG tablet Take 1 tablet (5 mg total) by mouth every eight (8) hours as needed for nausea. 30 tablet 0    sodium bicarbonate 650 mg tablet Take 1 tablet (650 mg total) by mouth two (2) times a day. 180 tablet 3    tacrolimus (PROGRAF) 1 MG capsule Take 3 capsules (3 mg total) by mouth two (2) times a day. Take with 5 mg capsules (for dose adjustments); on tac 8 mg twice daily 540 capsule 3    tacrolimus (PROGRAF) 5 MG capsule Take 1 capsule (5 mg total) by mouth two (2) times a day. Take with three, 1 mg capsule, twice a day for total of 8 mg twice daily  360 capsule 3    valGANciclovir (VALCYTE) 450 mg tablet Take 1 tablet (450 mg total) by mouth daily. 30 tablet 1     Current Facility-Administered Medications   Medication Dose Route Frequency Provider Last Rate Last Admin    sodium chloride 0.9 % (NS) bag 1,000 mL  1,000 mL Intravenous Once Leafy Half, MD   Stopped at 10/06/22 1550        Changes to medications: Basel reports no changes at this time.    Allergies   Allergen Reactions    Amlodipine Hives, Palpitations, Rash and Other (See Comments)     Causes fatigue      Doxercalciferol Hives    Fish Containing Products Hives and Shortness Of Breath     Please do not serve fish for meals. Highly allergic         Iron Sucrose Hives    Labetalol Itching and Rash    Metoclopramide Hives and Other (See Comments)     Causes ticks/ jerks    Metoprolol Hives    Nifedipine Hives, Itching and Shortness Of Breath    Other Swelling     Suture material, localized swelling at closure site    Sulfamethoxazole-Trimethoprim Rash and Other (See Comments)     Renal failure      Vancomycin Hives and Other (See Comments)     Patient states vancomycin caused kidney injury    Calcitonin     Peanut Oil Other (See Comments)    Potassium Clavulanate     Azathioprine Itching and Rash    Clindamycin Other (See Comments) and Palpitations     Chest pain    Hydralazine Itching and Dizziness           Sensipar [Cinacalcet] Hives       Changes to allergies: No    SPECIALTY MEDICATION ADHERENCE     Valganciclovir 450 mg: 15 days of medicine on hand   Tacrolimus 5 mg: 3 days of medicine on hand   Biktarvy 50-200-25 mg: 1 days of medicine on hand   Azathioprine 100 mg: 3 days of medicine on hand   Prednisone 5 mg: 3 days of medicine on hand     Medication Adherence    Patient reported X missed doses in the last month: 0  Specialty Medication: Azathioprine 100mg   Patient is on additional specialty medications: Yes  Additional Specialty Medications: Tacrolimus 5mg   Patient Reported Additional Medication X Missed Doses in the Last Month: 0  Patient is on more than two specialty medications: Yes  Specialty Medication: Biktarvy 50-200-25mg   Patient Reported Additional Medication X Missed Doses in the Last Month: 0  Specialty Medication: Prednisone 5mg   Patient Reported Additional Medication X Missed Doses in the Last Month: 0  Specialty Medication: Valganciclovir 450mg   Patient Reported Additional Medication X Missed Doses in the Last Month: 0          Specialty medication(s) dose(s) confirmed: Regimen is correct and unchanged.     Are there any concerns with adherence? No    Adherence counseling provided? Not needed    CLINICAL MANAGEMENT AND INTERVENTION      Clinical Benefit Assessment:    Do you feel the medicine is effective or helping your condition? Yes    Clinical Benefit counseling provided? Not needed    Adverse Effects Assessment:    Are you experiencing any side effects? No    Are you experiencing difficulty administering your medicine? No    Quality of Life Assessment:  Quality of Life    Rheumatology  Oncology  Dermatology  Cystic Fibrosis          How many days over the past month did your kidney transplant  keep you from your normal activities? For example, brushing your teeth or getting up in the morning. 0    Have you discussed this with your provider? Not needed    Acute Infection Status:    Acute infections noted within Epic:  No active infections  Patient reported infection: None    Therapy Appropriateness:    Is therapy appropriate based on current medication list, adverse reactions, adherence, clinical benefit and progress toward achieving therapeutic goals? Yes, therapy is appropriate and should be continued     DISEASE/MEDICATION-SPECIFIC INFORMATION      N/A    Solid Organ Transplant: Not Applicable    PATIENT SPECIFIC NEEDS     Does the patient have any physical, cognitive, or cultural barriers? No    Is the patient high risk? No    Did the patient require a clinical intervention? No    Does the patient require physician intervention or other additional services (i.e., nutrition, smoking cessation, social work)? No    SOCIAL DETERMINANTS OF HEALTH     At the [facility], we have learned that life circumstances - like trouble affording food, housing, utilities, or transportation can affect the health of many of our patients.   That is why we wanted to ask: are you currently experiencing any life circumstances that are negatively impacting your health and/or quality of life? Patient declined to answer    Social Determinants of Health     Financial Resource Strain: Low Risk  (09/06/2022)    Overall Financial Resource Strain (CARDIA)     Difficulty of Paying Living Expenses: Not hard at all   Internet Connectivity: Not on file   Food Insecurity: No Food Insecurity (09/06/2022)    Hunger Vital Sign     Worried About Running Out of Food in the Last Year: Never true     Ran Out of Food in the Last Year: Never true   Tobacco Use: Low Risk  (10/12/2022)    Patient History     Smoking Tobacco Use: Never     Smokeless Tobacco Use: Never     Passive Exposure: Not on file   Housing/Utilities: Low Risk  (09/06/2022)    Housing/Utilities     Within the past 12 months, have you ever stayed: outside, in a car, in a tent, in an overnight shelter, or temporarily in someone else's home (i.e. couch-surfing)?: No     Are you worried about losing your housing?: No     Within the past 12 months, have you been unable to get utilities (heat, electricity) when it was really needed?: No   Alcohol Use: Not At Risk (09/05/2018)    Received from [facility]    AUDIT-C     Frequency of Alcohol Consumption: Never     Average Number of Drinks: Not on file     Frequency of Binge Drinking: Not on file   Transportation Needs: No Transportation Needs (09/06/2022)    PRAPARE - Transportation     Lack of Transportation (Medical): No     Lack of Transportation (Non-Medical): No   Substance Use: Not on file   Health Literacy: Not on file   Physical Activity: Not on file   Interpersonal Safety: Unknown (10/23/2022)  Interpersonal Safety     Unsafe Where You Currently Live: Not on file     Physically Hurt by Anyone: Not on file     Abused by Anyone: Not on file   Stress: Not on file   Intimate Partner Violence: Patient Declined (03/05/2022)    Received from [facility] [place], [facility] [place], Afraid, Rape, and Kick questionnaire     Fear of Current or Ex-Partner: Patient declined     Emotionally Abused: Patient declined     Physically Abused: Patient declined     Sexually Abused: Patient declined   Depression: Not at risk (11/08/2021)    Received from [employer], [place] [facility]    PHQ-2     Depression Risk: 0   Social Connections: Unknown (07/07/2021)    Received from [facility]    Social Network     Social Network: Not on file       Would you be willing to receive help with any of the needs that you have identified today? Not applicable       SHIPPING     Specialty Medication(s) to be Shipped:   Infectious Disease: Biktarvy and Transplant: tacrolimus 5mg , Prednisone 5mg , and Azathioprine 100mg   Patient declined valganciclovir today.    Other medication(s) to be shipped:  carvedilol, dapsone, minoxidil     Changes to insurance: No    Delivery Scheduled: Yes, Expected medication delivery date: 10/25/22.     Medication will be delivered via UPS to the confirmed prescription address in [facility].    The patient will receive a drug information handout for each medication shipped and additional FDA Medication Guides as required.  Verified that patient has previously received a Conservation officer, historic buildings and a Surveyor, mining.    The patient or caregiver noted above participated in the development of this care plan and knows that they can request review of or adjustments to the care plan at any time.      All of the patient's questions and concerns have been addressed.    Tera Helper, [facility]   [facility] Shared [facility] Specialty Pharmacist

## 2022-10-23 NOTE — Unmapped (Signed)
[facility] Specialty Medication Onboarding    Specialty Medication: Azathioprine 100mg  tablet  Prior Authorization: Not Required   Financial Assistance: No - copay  <$25  Final Copay/Day Supply: $0 / 30 days    Insurance Restrictions: Yes - max 1 month supply     Notes to Pharmacist: Part B billed  Credit Card on File: no    The triage team has completed the benefits investigation and has determined that the patient is able to fill this medication at [facility]. Please contact the patient to complete the onboarding or follow up with the prescribing physician as needed.

## 2022-10-24 DIAGNOSIS — Z940 Kidney transplant status: Principal | ICD-10-CM

## 2022-10-24 LAB — RENAL FUNCTION PANEL
ALBUMIN: 3.9 g/dL — ABNORMAL LOW (ref 4.1–5.1)
BLOOD UREA NITROGEN: 15 mg/dL (ref 6–20)
BUN / CREAT RATIO: 7 — ABNORMAL LOW (ref 9–20)
CALCIUM: 11.6 mg/dL — ABNORMAL HIGH (ref 8.7–10.2)
CHLORIDE: 111 mmol/L — ABNORMAL HIGH (ref 96–106)
CO2: 17 mmol/L — ABNORMAL LOW (ref 20–29)
CREATININE: 2.16 mg/dL — ABNORMAL HIGH (ref 0.76–1.27)
EGFR: 39 mL/min/{1.73_m2} — ABNORMAL LOW
GLUCOSE: 99 mg/dL (ref 70–99)
PHOSPHORUS, SERUM: 2 mg/dL — ABNORMAL LOW (ref 2.8–4.1)
POTASSIUM: 4.6 mmol/L (ref 3.5–5.2)
SODIUM: 139 mmol/L (ref 134–144)

## 2022-10-24 LAB — MAGNESIUM: MAGNESIUM: 1.4 mg/dL — ABNORMAL LOW (ref 1.6–2.3)

## 2022-10-24 LAB — TACROLIMUS LEVEL: TACROLIMUS BLOOD: 8.9 ng/mL (ref 2.0–20.0)

## 2022-10-24 MED FILL — BIKTARVY 50 MG-200 MG-25 MG TABLET: ORAL | 30 days supply | Qty: 30 | Fill #0

## 2022-10-24 NOTE — Unmapped (Addendum)
[facility] Pharmacist has reviewed a new prescription for tacrolimus that indicates a dose increase.  Patient was counseled on this dosage change by EK- see epic note from 10/12/22.  Next refill call date adjusted if necessary.        Clinical Assessment Needed For: Dose Change  Medication: Tacrolimus 5mg  capsule  Last Fill Date/Day Supply: 09/21/2022 / 30 days  Copay $0  Was previous dose already scheduled to fill: Yes    Notes to Pharmacist: Scheduled to fill 08/27

## 2022-10-24 NOTE — Unmapped (Addendum)
[facility] Pharmacist has reviewed a new prescription for tacrolimus that indicates a dose decrease.  Patient was counseled on this dosage change by EK- see epic note from 10/12/22.  Next refill call date adjusted if necessary.          Clinical Assessment Needed For: Dose Change  Medication: tacrolimus 5 MG capsule (PROGRAF)  Last Fill Date/Day Supply: 7/25 / 30  Copay $0  Was previous dose already scheduled to fill: Yes    Notes to Pharmacist: Dose decrease

## 2022-10-25 DIAGNOSIS — Z79899 Other long term (current) drug therapy: Principal | ICD-10-CM

## 2022-10-25 DIAGNOSIS — Z940 Kidney transplant status: Principal | ICD-10-CM

## 2022-10-25 DIAGNOSIS — B20 Human immunodeficiency virus [HIV] disease: Principal | ICD-10-CM

## 2022-10-25 LAB — CMV DNA, QUANTITATIVE, PCR: CMV QUANT: NEGATIVE [IU]/mL

## 2022-10-25 NOTE — Unmapped (Signed)
Reviewed recent labs with [name],  ok to cancel biopsy as patient's creatinine has trended down and pt asked to re-schedule or next week due to transportation.  Pt advised we will cancel biopsy for this wee and he will get labs Friday prior to his stent removal appt.  He will see [name] in clinic next Thursday and we will repeat DSAs and discuss biopsy at that appt.   Pt in agreement with plan

## 2022-10-26 DIAGNOSIS — Z940 Kidney transplant status: Principal | ICD-10-CM

## 2022-10-27 ENCOUNTER — Ambulatory Visit: Admit: 2022-10-27 | Discharge: 2022-10-27 | Payer: MEDICARE

## 2022-10-27 ENCOUNTER — Ambulatory Visit: Admit: 2022-10-27 | Discharge: 2022-10-27 | Payer: MEDICARE | Attending: Urology | Primary: Urology

## 2022-10-27 DIAGNOSIS — Z940 Kidney transplant status: Principal | ICD-10-CM

## 2022-10-27 DIAGNOSIS — Z79899 Other long term (current) drug therapy: Principal | ICD-10-CM

## 2022-10-27 LAB — RENAL FUNCTION PANEL
ALBUMIN: 4 g/dL (ref 3.4–5.0)
ANION GAP: 5 mmol/L (ref 5–14)
BLOOD UREA NITROGEN: 15 mg/dL (ref 9–23)
BUN / CREAT RATIO: 7
CALCIUM: 12.3 mg/dL (ref 8.7–10.4)
CHLORIDE: 115 mmol/L — ABNORMAL HIGH (ref 98–107)
CO2: 21 mmol/L (ref 20.0–31.0)
CREATININE: 2.06 mg/dL — ABNORMAL HIGH
EGFR CKD-EPI (2021) MALE: 41 mL/min/{1.73_m2} — ABNORMAL LOW (ref >=60)
GLUCOSE RANDOM: 90 mg/dL (ref 70–179)
PHOSPHORUS: 2.1 mg/dL — ABNORMAL LOW (ref 2.4–5.1)
POTASSIUM: 4.9 mmol/L — ABNORMAL HIGH (ref 3.4–4.8)
SODIUM: 141 mmol/L (ref 135–145)

## 2022-10-27 LAB — CBC W/ AUTO DIFF
BASOPHILS ABSOLUTE COUNT: 0 10*9/L (ref 0.0–0.1)
BASOPHILS RELATIVE PERCENT: 0.8 %
EOSINOPHILS ABSOLUTE COUNT: 0.1 10*9/L (ref 0.0–0.5)
EOSINOPHILS RELATIVE PERCENT: 3.5 %
HEMATOCRIT: 32.6 % — ABNORMAL LOW (ref 39.0–48.0)
HEMOGLOBIN: 10.7 g/dL — ABNORMAL LOW (ref 12.9–16.5)
LYMPHOCYTES ABSOLUTE COUNT: 0.3 10*9/L — ABNORMAL LOW (ref 1.1–3.6)
LYMPHOCYTES RELATIVE PERCENT: 10.4 %
MEAN CORPUSCULAR HEMOGLOBIN CONC: 32.8 g/dL (ref 32.0–36.0)
MEAN CORPUSCULAR HEMOGLOBIN: 30.2 pg (ref 25.9–32.4)
MEAN CORPUSCULAR VOLUME: 92.2 fL (ref 77.6–95.7)
MEAN PLATELET VOLUME: 8.3 fL (ref 6.8–10.7)
MONOCYTES ABSOLUTE COUNT: 0.2 10*9/L — ABNORMAL LOW (ref 0.3–0.8)
MONOCYTES RELATIVE PERCENT: 7.4 %
NEUTROPHILS ABSOLUTE COUNT: 2.3 10*9/L (ref 1.8–7.8)
NEUTROPHILS RELATIVE PERCENT: 77.9 %
PLATELET COUNT: 153 10*9/L (ref 150–450)
RED BLOOD CELL COUNT: 3.54 10*12/L — ABNORMAL LOW (ref 4.26–5.60)
RED CELL DISTRIBUTION WIDTH: 18.7 % — ABNORMAL HIGH (ref 12.2–15.2)
WBC ADJUSTED: 3 10*9/L — ABNORMAL LOW (ref 3.6–11.2)

## 2022-10-27 LAB — TRANSPLANT HEPATITIS C RNA, QUANTITATIVE, PCR: HCV RNA: NOT DETECTED

## 2022-10-27 LAB — TACROLIMUS LEVEL, TROUGH: TACROLIMUS, TROUGH: 8.1 ng/mL (ref 5.0–15.0)

## 2022-10-27 LAB — MAGNESIUM: MAGNESIUM: 1.3 mg/dL — ABNORMAL LOW (ref 1.6–2.6)

## 2022-10-27 LAB — HIV RNA, QUANTITATIVE, PCR: HIV RNA QNT RSLT: NOT DETECTED

## 2022-10-27 LAB — HEPATITIS B DNA, QUANTITATIVE, PCR: HBV DNA QUANT: NOT DETECTED

## 2022-10-27 MED ADMIN — lidocaine 2% gel (XYLOCAINE) jelly urojet 20 mL: 20 mL | URETHRAL | @ 12:00:00 | Stop: 2022-10-27

## 2022-10-27 MED ADMIN — sodium chloride irrigation (NS) 0.9 % irrigation solution 1 mL: 1 mL | @ 12:00:00 | Stop: 2022-10-27

## 2022-10-27 NOTE — Unmapped (Signed)
[facility]:  Taking Care of Yourself After Cystoscopy Procedures    *Drink plenty of water for a day or two following your procedure.  Try to have about 8 ounces (one cup) at a time, and do this 6 times or more per day.  (If you have fluid restrictions, please ask the nurse or doctor for advice).    *AVOID alcoholic, carbonated and caffeinated drinks for a day or two, as they may cause uncomfortable symptoms.    *For the first 8 hours after the procedure, your urine may be pink or red in color.  Small clots or a few drops of blood can be a normal side effect of the instruments.  Large amounts of bleeding or difficulty urinating are not normal.  Call your doctor if this happens.    *You may experience some mild discomfort of a burning sensation with urination after having this procedure.  If it does not improve, or if other symptoms appear (fever, chills, or difficulty emptying), call your doctor.    *You may return to normal daily activities such as work, school, driving, exercising and housework.    *If your doctor gave you a prescription, take it as ordered.    *If you need a return appointment, the secretary will make it for you when you check out. To contact the Urology Clinic during business hours, call [phone].    *[facility] Operator can be reached at [phone] if you need to get in contact with your doctor.  After the hours, the operator can page the doctor on call for urgent concerns:    Urology Patients should ask for the Urology resident “on call”.    You can get more immediate assistance at the Emergency Room or Urgent Care if necessary.

## 2022-10-27 NOTE — Unmapped (Signed)
Ambu scope BJY#[id number]  exp 02/25/2025  Flexible grasper [id number]

## 2022-10-27 NOTE — Unmapped (Signed)
Procedure:  Local cystourethroscopy    Code: Cystoscopy, CPT 52000 with ureteral stent removal      Indication: Kidney transplant     Symptoms: No hematuria, bone pain, weight loss or somatic complaints.     Description:   Time out was performed immediately prior to the procedure.     The patient was prepped and draped in the usual sterile fashion in the relaxed dorsal lithotomy position. Flexible cystoscopy was performed. The urethral meatus and urethra were normal. The visualized bladder urothelium was normal. The ureteral stent was visualized, secured with the flexible grasper, and removed in its entirety without complication.  Patient tolerated the procedure well. he was given one dose of antibiotics for prophylaxis..    Impression and Plan:   He will follow-up with his transplant team as previously scheduled.

## 2022-10-28 LAB — CMV DNA, QUANTITATIVE, PCR
CMV QUANT: <35 [IU]/mL — ABNORMAL HIGH (ref <0)
CMV VIRAL LD: DETECTED — AB

## 2022-10-28 LAB — BK VIRUS QUANTITATIVE PCR, BLOOD: BK BLOOD RESULT: NOT DETECTED

## 2022-10-30 DIAGNOSIS — Z940 Kidney transplant status: Principal | ICD-10-CM

## 2022-10-31 DIAGNOSIS — Z940 Kidney transplant status: Principal | ICD-10-CM

## 2022-10-31 LAB — CBC W/ DIFFERENTIAL
BANDED NEUTROPHILS ABSOLUTE COUNT: 0 10*3/uL (ref 0.0–0.1)
BASOPHILS ABSOLUTE COUNT: 0 10*3/uL (ref 0.0–0.2)
BASOPHILS RELATIVE PERCENT: 1 %
EOSINOPHILS ABSOLUTE COUNT: 0.2 10*3/uL (ref 0.0–0.4)
EOSINOPHILS RELATIVE PERCENT: 7 %
HEMATOCRIT: 30.2 % — ABNORMAL LOW (ref 37.5–51.0)
HEMOGLOBIN: 10 g/dL — ABNORMAL LOW (ref 13.0–17.7)
IMMATURE GRANULOCYTES: 1 %
LYMPHOCYTES ABSOLUTE COUNT: 0.3 10*3/uL — ABNORMAL LOW (ref 0.7–3.1)
LYMPHOCYTES RELATIVE PERCENT: 13 %
MEAN CORPUSCULAR HEMOGLOBIN CONC: 33.1 g/dL (ref 31.5–35.7)
MEAN CORPUSCULAR HEMOGLOBIN: 30.5 pg (ref 26.6–33.0)
MEAN CORPUSCULAR VOLUME: 92 fL (ref 79–97)
MONOCYTES ABSOLUTE COUNT: 0.2 10*3/uL (ref 0.1–0.9)
MONOCYTES RELATIVE PERCENT: 8 %
NEUTROPHILS ABSOLUTE COUNT: 1.8 10*3/uL (ref 1.4–7.0)
NEUTROPHILS RELATIVE PERCENT: 70 %
PLATELET COUNT: 160 10*3/uL (ref 150–450)
RED BLOOD CELL COUNT: 3.28 x10E6/uL — ABNORMAL LOW (ref 4.14–5.80)
RED CELL DISTRIBUTION WIDTH: 16.2 % — ABNORMAL HIGH (ref 11.6–15.4)
WHITE BLOOD CELL COUNT: 2.6 10*3/uL — ABNORMAL LOW (ref 3.4–10.8)

## 2022-11-01 ENCOUNTER — Ambulatory Visit: Admit: 2022-11-01 | Discharge: 2022-11-02 | Payer: MEDICARE

## 2022-11-01 LAB — RENAL FUNCTION PANEL
ALBUMIN: 3.9 g/dL — ABNORMAL LOW (ref 4.1–5.1)
BLOOD UREA NITROGEN: 13 mg/dL (ref 6–20)
BUN / CREAT RATIO: 6 — ABNORMAL LOW (ref 9–20)
CALCIUM: 12.1 mg/dL — ABNORMAL HIGH (ref 8.7–10.2)
CHLORIDE: 111 mmol/L — ABNORMAL HIGH (ref 96–106)
CO2: 18 mmol/L — ABNORMAL LOW (ref 20–29)
CREATININE: 2.01 mg/dL — ABNORMAL HIGH (ref 0.76–1.27)
EGFR: 42 mL/min/{1.73_m2} — ABNORMAL LOW
GLUCOSE: 90 mg/dL (ref 70–99)
PHOSPHORUS, SERUM: 2.2 mg/dL — ABNORMAL LOW (ref 2.8–4.1)
POTASSIUM: 4.8 mmol/L (ref 3.5–5.2)
SODIUM: 136 mmol/L (ref 134–144)

## 2022-11-01 LAB — MAGNESIUM: MAGNESIUM: 1.2 mg/dL — ABNORMAL LOW (ref 1.6–2.3)

## 2022-11-01 LAB — TACROLIMUS LEVEL: TACROLIMUS BLOOD: 11 ng/mL (ref 2.0–20.0)

## 2022-11-01 NOTE — Unmapped (Addendum)
IMMUNOCOMPROMISED HOST INFECTIOUS DISEASE PROGRESS NOTE    Assessment/Plan:     [name] is a 42 y.o. 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)  - Serologies: CMV D-/R+, EBV D+/R+; Toxo D/R  - Induction/donor history/surgical complications (if <1 yr): thymo  - Prophylaxis prior to admission: currently doing pre-emptive CMV monitoring per patient preference,  dapsone (TMP+SMX allergy, had respiratory issues with inhaled pentamidine, prefers to avoid atovaquone due to liquid formulation), patient prefers no HSV ppx  - immunosuppression: tacrolimus (goal 8-10), azathioprine, prednisone 5 mg daily (initially on MMF but did not tolerated due to concern for pill esophagitis/gastritis)  - History of rejection if any:  - s/p stent removal 10/27/22       HIV  - HIV Provider: Daiva Eves, Lisette Grinder, MD, [place], [place] [address], [phone] (Work)   - Diagnosed 10/2013; pre-ART VL 98,300 (10/2013)  - 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  - CD4 nadir 10 (06/2015)  - HIV genotype: 184V (per epic note; full genotype reports not available, last in 05/2016)  - Currently on Biktarvy (previously on DTG + TAF/FTC)  - VL mostly <20 since 2018 (01/2021, 06/2020, 11/2019, 10/2018, 05/2018 with some low level viremia 05/2022)  - CD4 260 (31%) (01/2021)  - does not use a pillbox  - Great adherence; last time missed dose in 2019  - G6PD wnl 10/2013      Prior infections  - Right otitis media treated w cefdinir/ ofloxacin otic gtts 05/2020  - E faecalis UTI 05/2016; vanc and ampicillin sensitive  - MRSA right ear culture/otitis, 10/2015  - Cellulitis 11/2013  - Right thigh abscess w/ Group A Strep; I&D right lower extremity 2016; treated with primaxin, clinda, dapto; discharged on Keflex  - Shingles before 2015  -Buttock abscess 04/2021 and prior frequent SSTI/skin boils  - remote HSV reactivation (mucocutaneous disease)     Hx Syphilis 11/2019  - RPR 1:8 (11/2019); treated (nonreactive 10/2018)  - per CE, re-treated with 14 days of doxycycline  - No other stds  - RPR 1:8 (02/2020), RPR 1:4 (06/2020) -> RPR 1:2 (01/2021)     Relevant comorbidities  Hx pericardial effusion s/p pericardial window  Mitral valve stenosis  Pulmonary HTN with RV dysfunction  OSA on bipap  DVT 2015,  Hx genital warts  Hx seizures, per report 2/2 reglan; last seizure >10 years ago  Intussusception s/p bowel resection, ileocecectomy   Multiple seasonal/environmental allergies  Gastric ulcer with moderate duodenitis (07/2022) with repeat EGD 09/12/22 with severe esophagitis. Infectious stains negative, leading etiology mycophenolate induced esophagitis with plans for fu EGD in 2 months   Post transplant hyperparathyroidism and hypercalcemia       Caregivers: Mom Cala Bradford) and Aunt Wilkie Aye) - Only Mom is aware of HIV status         Antimicrobial allergies/intolerances  Penicillin - hives  Bactrim - nausea, significant emesis  Vancomycin - kidney injury, nausea  MMF:       RECOMMENDATIONS  He prefers to return to Dr. Daiva Eves, his long-term HIV doctor. I called his office to coordinate care and have sent him a page at [phone]. Patient does need to make an appt there in the coming months for routine HIV care and STI screenings, which we did not do in clinic today    Diagnosis  Noted plan for repeat EGD in 2 months (around 01/06/23);  his symptoms hae resolved  Needs CMV VL today and then weekly for pre-emptive monitoring per patient preference (wants to avoid pills)  Recommend yearly anal pap per guidelines. Patient has never had one and has never had anal sex, so low risk for HPV-mediated anal cancer. He does have oral sex, so needs at least yearly head and neck exam (he and I discussed this today)  Check HIV VL and CD4 labs as follows:  At first post-hospital visit (~4 weeks)--this was done on 8/30 and was undetectable  Within 30 days of any med changes expected to alter ART levels  With any major change in health status (i.e. hospitalizations)  At 60-90 day intervals for 12 months after transplant  At 90-120 day intervals afterwards if fully suppressed and transplant condition is stable (per HIV guidelines)      Management  Continue Biktarvy   If CMV VL remains detectable will need to start treatment with valganciclovir, patient aware.     Antimicrobial prophylaxis required for transplant immunosuppression   Per patient preference we are doing pre-emptive monitoring with weekly CMV PCR for first 3 months (he's moderate risk so this is a reasonable approach is he's able to come in for weekly labs). 10/27/22 level was detected but <35. If detectable on next check we will need to discuss starting treatment with valganciclovir renally dosed equiv of 900 mg BID until weekly VL undetectable x 2 consecutive checks. While RCT looking at preemptive therapy in high-risk liver recipients started treatment with any viremia, RCT from 2023 looking at ppx vs pre-emptive therapy in kidney transplants did not start treatment until CMV VL GE 1000. Given he already has some cytopenias, is pill averse, and is moderate risk (not high risk), probably reasonable to allow some very low level viremia probably up to 400 or so but would be a discussion between me, patient, transplant nephrology  He does have history of mucocutaneous HSV reactivation but hasn't had a lesion in a while. We discussed risk/benefit of valacyclovir for HSV ppx but he elected to wait. Asked him to call if he develops any sign/symptoms of mucocutaneous HSV    Intensive toxicity monitoring for prescription antimicrobials   CBC with diff, BMP monthly, would also get CMV weekly while doing pre-emptive monitoring during first 3 months post-tx  clinical assessments for rashes or other skin changes    Follow up prn    ADDENDUM 11/06/22: CMV VL came back at 47/log 1.67. Patient asymptomatic. Will continue pre-emptive monitoring per his preference. Continue weekly CMV VL until 3 months post transplant. Called him to discuss, he is aware.           Recommendations were communicated via shared medical record.    Audley Hose, MD  [facility] Division of Infectious Diseases    Subjective     External record(s): Consultant note(s): saw GI to fu for likely pill esophagitis, plan for repeat EGD in 12/2022. Saw transplant surgery 10/12/22. Having persistent hypercalcemia, ongoing work-up plan for that. Had rise in de novo DSA, plan for adjustment in immunosuppression-->increased azathioprine to 200 mg daily  .    Independent historian(s): no independent historian required.       Interval History:   Per above  Additionally, swallowing has resolved  Taking Biktarvy without issues  Still prefers to avoid valganciclovir if possible  Developed rash on chest and back with azathioprine, managing with benadryl    Medications:    Current Outpatient Medications:     acetaminophen (TYLENOL)  160 mg/5 mL solution, Take 20.3 mL (650 mg total) by mouth every six (6) hours as needed for fever or pain., Disp: 473 mL, Rfl: 0    albuterol HFA 90 mcg/actuation inhaler, Inhale 1-2 puffs every four (4) to six (6) hours as needed for wheezing or shortness of breath., Disp: 18 g, Rfl: 11    azathioprine (AZASAN) 100 mg tablet, Take 2 tablets (200 mg total) by mouth daily., Disp: 60 tablet, Rfl: 11    bictegrav-emtricit-tenofov ala (BIKTARVY) 50-200-25 mg tablet, Take 1 tablet by mouth every morning., Disp: 30 tablet, Rfl: 11    calamine-zinc oxide 8-8 % Lotn, Apply topically four (4) times a day., Disp: 177 mL, Rfl: 0    carvedilol (COREG) 25 MG tablet, Take 2 tablets (50 mg total) by mouth two (2) times a day., Disp: 360 tablet, Rfl: 3    cetirizine (ZYRTEC) 10 MG tablet, Take 1 tablet (10 mg total) by mouth daily as needed (Itching). (Patient not taking: Reported on 10/27/2022), Disp: 30 tablet, Rfl: 11    dapsone 100 MG tablet, Take 1 tablet (100 mg total) by mouth daily. START taking on 10/05/22, Disp: 30 tablet, Rfl: 4    diphenhydrAMINE (BENADRYL) 25 mg capsule, Take 1 capsule (25 mg total) by mouth every six (6) hours as needed for itching., Disp: 96 capsule, Rfl: 0    docusate sodium (COLACE) 100 MG capsule, Take 1 capsule (100 mg total) by mouth two (2) times a day as needed for constipation. (Patient not taking: Reported on 10/27/2022), Disp: 60 capsule, Rfl: 11    hydrocortisone 1 % cream, Apply topically two (2) times a day. (Patient not taking: Reported on 10/27/2022), Disp: 30 g, Rfl: 11    magnesium oxide-Mg AA chelate (MAGNESIUM, AMINO ACID CHELATE,) 133 mg, Take 1 tablet by mouth two (2) times a day. Hold until directed to take by nurse coordinator (Patient not taking: Reported on 10/27/2022), Disp: 60 tablet, Rfl: 11    minoxidil (LONITEN) 2.5 MG tablet, Take 2 tablets (5 mg total) by mouth two (2) times a day., Disp: 180 tablet, Rfl: 3    naloxone (NARCAN) 4 mg nasal spray, SPRAY TO 1 NOSTRIL IN CASE OF OPIOID OVERDOSE, MAY REPEAT IN 2-3 MINUTES UNTIL EMS ARRIVES OR RESPONSIVE (Patient not taking: Reported on 10/27/2022), Disp: , Rfl:     pantoprazole (PROTONIX) 40 MG tablet, Take 1 tablet (40 mg total) by mouth two (2) times a day., Disp: 180 tablet, Rfl: 3    predniSONE (DELTASONE) 5 MG tablet, Take 1 tablet (5 mg total) by mouth in the morning., Disp: 90 tablet, Rfl: 3    prochlorperazine (COMPAZINE) 5 MG tablet, Take 1 tablet (5 mg total) by mouth every eight (8) hours as needed for nausea. (Patient not taking: Reported on 10/27/2022), Disp: 30 tablet, Rfl: 0    sodium bicarbonate 650 mg tablet, Take 1 tablet (650 mg total) by mouth two (2) times a day. (Patient not taking: Reported on 10/27/2022), Disp: 180 tablet, Rfl: 3    tacrolimus (PROGRAF) 1 MG capsule, Hold (Patient not taking: Reported on 10/27/2022), Disp: , Rfl:     tacrolimus (PROGRAF) 5 MG capsule, Take 2 capsules (10 mg total) by mouth two (2) times a day., Disp: 360 capsule, Rfl: 3    valGANciclovir (VALCYTE) 450 mg tablet, Take 1 tablet (450 mg total) by mouth daily. (Patient not taking: Reported on 10/27/2022), Disp: 30 tablet, Rfl: 1    Current Facility-Administered Medications:  cephalexin (KEFLEX) capsule 500 mg, 500 mg, Oral, Once, Lewie Chamber, MD    sodium chloride 0.9 % (NS) bag 1,000 mL, 1,000 mL, Intravenous, Once, Kotzen, Francine Graven, MD, Stopped at 10/06/22 1550    Objective     Vital signs:  There were no vitals taken for this visit.    Physical Exam:  Const [x]  vital signs above    []  NAD, non-toxic appearance []  Chronically ill-appearing, non-distressed        Eyes []  Lids normal bilaterally, conjunctiva anicteric and noninjected OU     [] PERRL  [] EOMI        ENMT []  Normal appearance of external nose and ears, no nasal discharge        []  MMM, no lesions on lips or gums [x]  No thrush, leukoplakia, oral lesions  []  Dentition good []  Edentulous []  Dental caries present  []  Hearing normal  []  TMs with good light reflexes bilaterally         Neck []  Neck of normal appearance and trachea midline        []  No thyromegaly, nodules, or tenderness   []  Full neck ROM        Lymph []  No LAD in neck     []  No LAD in supraclavicular area     []  No LAD in axillae   []  No LAD in epitrochlear chains     []  No LAD in inguinal areas        CV []  RRR            []  No peripheral edema     []  Pedal pulses intact   []  No abnormal heart sounds appreciated   []  Extremities WWP   Systolic murmur      Resp [x]  Normal WOB at rest    []  No breathlessness with speaking, no coughing  []  CTA anteriorly    []  CTA posteriorly          GI []  Normal inspection, NTND   []  NABS     []  No umbilical hernia on exam       []  No hepatosplenomegaly     []  Inspection of perineal and perianal areas normal        GU []  Normal external genitalia     [] No urinary catheter present in urethra   []  No CVA tenderness    [x]  No tenderness over renal allograft        MSK []  No clubbing or cyanosis of hands       []  No vertebral point tenderness  []  No focal tenderness or abnormalities on palpation of joints in RUE, LUE, RLE, or LLE        Skin []  No rashes, lesions, or ulcers of visualized skin     []  Skin warm and dry to palpation   Hyperpigmented flat rash on chest and back  Left arm fistula hard, non-tender      Neuro [x]  Face expression symmetric  []  Sensation to light touch grossly intact throughout    []  Moves extremities equally    []  No tremor noted        []  CNs II-XII grossly intact     []  DTRs normal and symmetric throughout []  Gait unremarkable        Psych [x]  Appropriate affect       []  Fluent speech         []  Attentive, good eye contact  []  Oriented to person, place, time          []   Judgment and insight are appropriate           Data for Medical Decision Making       I discussed mgm't w/qualified health care professional(s) involved in case: recommendations communicated with renal transplant team .    I reviewed CBC results (WBC low at 2.6), chemistry results (Cr remains elevated around 2 but stable), and micro result(s) (HIV VL not detected 10/27/22).    I independently visualized/interpreted not done.       Results in Past 30 Days  Result Component Current Result Ref Range Previous Result Ref Range   Absolute Eosinophils 0.2 (10/31/2022) 0.0 - 0.4 x10E3/uL 0.1 (10/27/2022) 0.0 - 0.5 10*9/L   Absolute Lymphocytes 0.3 (L) (10/31/2022) 0.7 - 3.1 x10E3/uL 0.3 (L) (10/27/2022) 1.1 - 3.6 10*9/L   Absolute Neutrophils 1.8 (10/31/2022) 1.4 - 7.0 x10E3/uL 2.3 (10/27/2022) 1.8 - 7.8 10*9/L   BUN 13 (10/31/2022) 6 - 20 mg/dL 15 (1/61/0960) 9 - 23 mg/dL   Calcium 45.4 (H) (0/10/8117) 8.7 - 10.2 mg/dL 14.7 (HH) (10/26/5619) 8.7 - 10.4 mg/dL   Creatinine 3.08 (H) (10/31/2022) 0.76 - 1.27 mg/dL 6.57 (H) (8/46/9629) 5.28 - 1.18 mg/dL   HGB 41.3 (L) (04/02/4008) 13.0 - 17.7 g/dL 27.2 (L) (5/36/6440) 34.7 - 16.5 g/dL   Magnesium 1.2 (L) (05/30/5954) 1.6 - 2.3 mg/dL 1.3 (L) (3/87/5643) 1.6 - 2.6 mg/dL   Phosphorus 2.1 (L) (05/26/5186) 2.4 - 5.1 mg/dL 1.9 (L) (05/28/6604) 2.4 - 5.1 mg/dL   Platelet 301 (6/0/1093) 150 - 450 x10E3/uL 153 (10/27/2022) 150 - 450 10*9/L   Potassium 4.8 (10/31/2022) 3.5 - 5.2 mmol/L 4.9 (H) (10/27/2022) 3.4 - 4.8 mmol/L   WBC 2.6 (L) (10/31/2022) 3.4 - 10.8 x10E3/uL 3.0 (L) (10/27/2022) 3.6 - 11.2 10*9/L

## 2022-11-01 NOTE — Unmapped (Unsigned)
The patient reports they are physically located in [place] and is currently: at home. I conducted a phone visit.  I spent 30 minutes on the phone call with the patient on the date of service .       **THIS PATIENT WAS NOT SEEN IN PERSON TO MINIMIZE POTENTIAL SPREAD OF COVID-19, PROTECT PATIENTS/PROVIDERS, AND REDUCE PPE UTILIZATION.**    PATIENT NAME: Christian Brown     MR#: [id number]    DOB: February 03, 1981    [facility]  CONFIDENTIAL SOCIAL WORK   KIDNEY POST TRANSPLANT FOLLOW UP      DATE OF EVALUATION: 11/01/2022    INFORMANTS: Patient/Christian Brown    PREFERRED LANGUAGE: English     INTERPRETER UTILIZED: N/A    TXP CARE TEAM:   Post Transplant RN Coordinator: Celene Squibb [phone]  Primary Transplant Provider: Montine Circle Procopio, Velora Heckler, [facility], Leafy Half, Erin M Fisher, Thomasene Mohair, Animesh Joellyn Haff, Dianna Limbo I Ruud    REFERRAL INFORMATION:    Mr.  Christian Brown is a 42 y.o. African-American male is s/p transplant for kidney transplantation . CSW follows up to assess recovery since DC.    TRANSPLANT DATE:   09/05/2022 (Kidney)    MOST RECENT HOSPITAL ADMISSION (@ [place]):   Previous admit date: 09/27/2022 to 09/29/22    FUTURE APPOINTMENTS (@ [place]):   Future Appointments   Date Time Provider Department Center   11/28/2022  1:00 PM Johny Chess, MD [facility]   12/14/2022  2:00 PM Kotzen, Francine Graven, MD [facility]       HOME HEALTH/DME NEEDS AT LAST DC:   HH: N/A   Services: N/A   Contact: n/a  DME: N/A  Other: N/A  Other Remaining:   Ureter stent: Yes, 8/30  Staples: Yes  Surgical drains x0: No  PD Cath: N/A    LIFESTYLE:  Physical activity:  Good  Nutrition/Appetite:  Good  Sleep: Fair; up and down, lots of bathroom trips    SOCIAL HISTORY:  Citizenship Status: [place] Citizen   Marital Status: single  Lives with: lives with mom part-time (has same address as mom, but she is a traveling Scientist, forensic, a Environmental consultant)  Children/Dependents:  none   Social support: mom, aunt, local friends  Housing: no concerns with housing, reports housing stability      CAREGIVING PLAN:  Location:  at home w/ mother  Support/Caregivers:  mother  Household tasks/errands:  mother  Medication:  mother  Transportation: mother    COMPLIANCE HISTORY:  Medication Adherence: Good  Medication Concerns: denied problems taking medications, concerns about side effects, affordability, problems obtaining medications, and difficulty remembering medications  Other Adherence: Good     Side Effects: none    INSURANCE:  [employer] assistance: N/A  Payer/Plan Subscriber Name Rel Member # Group #   MEDICARE - MEDICARE P* Boden,Mcclain Self [id number]       [address]   MEDICAID [place] - MEDICAIKERT, SHACKETT Self [id number]       [address] [address]     Dialysis Start Date:  02/11/2015     SSDI eligibility: 2/2 ESRD per patient report    ESRD Medicare eligibility: 09/05/22; 08/28/22-08/26/25    [address]th BD: 01/22/2048    30 mon COB period: N/A; expired    Access to other insurance plans?: unknown    INCOME:  Current income sources: SSDI 2/2 ESRD; $1800  Monthly expenses: rent, utilities, car payment x1 (reliable x1), medication co-pays ($5/month), other customary bills  Financial Risks and Debts: hospital bills   Current income meets basic needs: yes, but managing with limited resources, shares expenses with mom     Highest completed grade level: some college   Currently employed: none   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, music History: not discussed today     ATTITUDE ABOUT TRANSPLANT:  Expectations: was not prepared for gastritis after txp; was not prepared for itching  Fears/Concerns: denies  What would you change?:  denies  Rec'd Info on Ltr to Donor Family: yes    MENTAL HEALTH HISTORY: reflective of current   Current issues/mood: denies  Medications: denies  Therapy: denies  SI/HI: denies    SUBSTANCE HISTORY: reflective of current   Tobacco: denies  Alcohol: denies  Illicit Substances: denies  OTC/Supplements: denies    PAIN HISTORY: reflective of current  Current : some pain still  Current use of pain medication/pain control: took oxycotin today before getting in car to ride to labs    SUMMARY:  Spoke w/ pt at appt time. Pt states that everything is out and that he has been feeling good.  Cleared to drive again.        No specific questions at this time.  Confirmed contact information for this CSW.          RECOMMENDATIONS:   1. Pt agreed to call w/ any future issues/questions.  2. No need for specific f/up at this time.        Lowella Petties, LCSW, CCTSW  Transplant Case Manager/Social Worker  [facility] for Transplant Care  Completed: 11/06/22 II:65551::denies}  Illicit Substances: {jenn denies admits wild II:65551::denies}  OTC/Supplements: {jenn denies admits wild II:65551::denies}    Denies         PAIN HISTORY: reflective of current  Current : ***  Current use of pain medication/pain control: ***    Took before getting in car; oxy...      SUMMARY:  ***      Anticipatory guidance/education provided on the following:  --Possibility of readmissions, unplanned clinic visits, extra lab work  --Possibility of mood changes, including when to call providers  --Gradual decline in frequency of clinic visits and lab work over time  --Importance of communication with txp team  --Caution to talk w/ txp team before taking any other prescriptions or OTC medications (incl vitamins, supplements. herbal teas)  --Additional infection control precautions during acute recovery phase (and ongoing)  --Additional physical & lifting restrictions (including driving) during acute recovery phase  --Normalization of complexity of care, frequently changing medications, and other details to keep organized  --Normalization of need for support/assistance post transplant for ALL patients  --Normalization of frequent urge/need to urinate, including possibility of incontinent accidents   --Encouragement to drink fluids as directed; normalization of difficulty at times with sudden increase in recommendations  --Verified pt/family has correct contact information for this CSW        RECOMMENDATIONS:   1. ***  2. F/up *** weeks    ________________      Q 2-3 weeks..    Need to go over ESRD Medicare benefits            ***, LCSW, CCTSW  Transplant Case Manager/Social Worker  [facility] for Transplant Care  Completed: 11/01/22        Cherlynn Polo  still in  Stent coming out 8/30....  Stomach better...    Still drinking water...    Explained Medicare rules...       No questions...    F/up q 2 weeks...    ______________________    Everything out, feeling good...  Cleared to drive!

## 2022-11-01 NOTE — Unmapped (Signed)
[facility]   TRANSPLANT FOLLOW UP     PCP: Belva Bertin, RTL   Kidney transplant coordinator: Celene Squibb    Date of Visit at Transplant clinic: 11/02/2022     Assessment/Recommendations:     # s/p deceased donor kidney transplant 09/05/2022   Native kidney disease collapsing FSGS/HIVAN  Graft function: creatinine 1.7-2.1, suspect higher today in setting of high tac level  DSAs: to draw per protocol  Post-surgical issues:  - aspirin for 1 year post-op  - ureteral stent removal 8/30  - incision healing well    # Immunosuppression  Tacrolimus (Prograf) 10 mg BID, goal trough 8-10 ng/mL   - the level on 9/3 was an 8 hr level, not changing dose today  Azathioprine 200 mg daily  Prednisone 5 mg daily (indicated due to HIVAN)  Initially MMF but did not tolerate due to gastritis; he may benefit from trial of Myfortic in future due to his elevated rejection risk    # Acute issues today  none    # BP management   Goal 130/80, as tolerated.  - carvedilol 50 mg BID  - minoxidil 5 mg BID  Future role for ARB?  Intolerances to amlodipine/nifedipine and hydralazine, hives to metoprolol and labetalol\.    # Infectious disease  CMV: D-/R+ EBV: D+/R+   CMV ppx:  Valcyte x3 mo  PJP ppx: s/p pentamadine x1 (c/b acute resp distress), start dapsone today. Duration 6 months. Hx Bactrim allergy; he prefers to avoid atovaquone due to liquid med.  Follows with ID for his HIV, on Biktarvy    # Anemia screening  - gave Aranesp 100 mcg in clinic today 8/9    # Cardiovascular: primary prevention  The ASCVD Risk score (Arnett DK, et al., 2019) failed to calculate.    # CKD-BMD  # Hyperparathyroidism s/p parathyroidetomy April 2021  Hypercalcemia: taking 2L fluid daily. Allergic to cinacaclet and there is not a good alternative.  - offering IVF at every clinic visit; patient typically declines  - has appt with Dr. Selena Batten on 10/1 to discuss parathyroidectomy (s/p parathyroidectomy April 2021 without re-implant; has one overactive gland remaining)    # Electrolytes  Mg: not yet needing supp  K acceptable    # Immunizations  Immunization History   Administered Date(s) Administered    COVID-19 VAC,BIVALENT,MODERNA(BLUE CAP) 10/10/2021    COVID-19 VACCINE,MRNA(MODERNA)(PF) 09/28/2019, 10/26/2019    HEPATITIS B VACCINE ADULT, ADJUVANTED, IM(HEPLISAV B) 01/16/2020, 03/18/2020, 05/12/2020    HEPATITIS B VACCINE ADULT,IM(ENERGIX B, RECOMBIVAX) 03/30/2015, 01/27/2016    Hepatitis A (Adult) 09/04/2016, 04/26/2017, 10/10/2021    Hepatitis A Vaccine - Unspecified Formulation 09/04/2016, 04/26/2017    Hepatitis B Vaccine, Unspecified Formulation 12/04/1994, 01/09/1995, 06/12/1995    INFLUENZA TIV (TRI) 64MO+ W/ PRESERV (IM) 11/20/2013, 11/11/2014, 03/15/2016, 11/27/2017, 12/11/2018    INFLUENZA TIV (TRI) PF (IM) 03/15/2016    Influenza Vaccine PF(Quad)(Egg Free)18+(Flublok) 12/09/2021    Influenza Vaccine Quad (IIV4 W/PRESERV) 64MO+ 11/01/2016, 11/26/2018    Influenza Vaccine Quad(IM)6 MO-Adult(PF) 11/20/2013, 11/11/2014, 11/29/2020    Influenza Virus Vaccine, unspecified formulation 11/27/2017, 12/11/2018, 12/12/2019, 11/27/2020    MENINGOCOCCAL VACCINE, A,C,Y, W-135(IM)(MENVEO) 07/10/2016, 03/22/2017    MMR 01/09/1995    Meningococcal Conjugate MCV4P 07/10/2016, 03/22/2017    PNEUMOCOCCAL POLYSACCHARIDE 23-VALENT 11/20/2013, 11/20/2013, 11/26/2018, 11/26/2018    Pneumococcal Conjugate 13-Valent 08/28/2017, 08/28/2017, 12/12/2019    TD(TDVAX),ADSORBED,2LF(IM)(PF) 06/12/1995    TdaP 03/08/2018, 03/08/2018       # Cancer screening  Colonoscopy: start age 49  Skin:  recommend yearly dermatology evaluation    # Follow up:  Labs 2x/week  Visits RTC phone visit 2 weeks, in person ~9/12      Kidney Transplant History:   Date of Transplant: 09/05/2022 (Kidney)  Type of Transplant: DDKT  KDPI: 18%  Cold ischemic time: 972 minutes  Warm ischemic time: 40 minutes  cPRA: 0-7%  HLA match: 4/6  Blood type: Donor O, Recipient O POS  ID: CMV: D-/R+ EBV: D+/R+, HCV donor serologies not immediately available (requesting this info)  Native Kidney Disease: collapsing FSGS/HIVAN (biopsy proven)    Native kidney biopsy: yes   Pre-transplant dialysis course: hemodialysis since 02/11/2015   Post-Transplant Course:    Delayed graft function requiring dialysis: no   Other complications: readmitted 7/31-8/2 for nausea/vomiting  Prior Transplants: None  Induction: thymoglobulin  Early steroid withdrawal: No (due to HIVAN)    Biopsies:   Zero-Hour Biopsy: none    History of Presenting Illness:     Since the last visit:  - BP 130/70 at home      Concerns about nonadherence: No    Social: lives in [place]. Staying with his mother in [place] during recovery. On medical leave, he works as a Occupational hygienist.     Review of Systems:   A 12-system review was negative except as documented in the HPI.    Physical Exam:     BP 128/77 (BP Site: R Arm, BP Position: Sitting, BP Cuff Size: Medium)  - Pulse 84  - Temp 36.6 ??C (97.8 ??F) (Temporal)  - Resp 18  - Ht 170.2 cm (5' 7) Comment: reported - Wt 71.8 kg (158 lb 4.6 oz) Comment: reported - BMI 24.79 kg/m??   Constitutional:  Well-appearing in NAD  Eyes:  anicteric sclerae  ENT:  MMM  CV:  RRR, no m/r/g, no JVD, extremities WWP with no edema  Resp:  Good air movement, CTAB  GI:  Abdomen soft, NTND, +bs  MSK:  Grossly normal, exam is limited  Skin:  Normal turgor, no rash  Neuro:  Grossly normal, exam is limited  Psych:  Normal affect  Dialysis access: LUE AVF forearm      Allergies:   Allergies   Allergen Reactions    Amlodipine Hives, Palpitations, Rash and Other (See Comments)     Causes fatigue      Doxercalciferol Hives    Fish Containing Products Hives and Shortness Of Breath     Please do not serve fish for meals. Highly allergic         Iron Sucrose Hives    Labetalol Itching and Rash    Metoclopramide Hives and Other (See Comments)     Causes ticks/ jerks    Metoprolol Hives    Nifedipine Hives, Itching and Shortness Of Breath    Other Swelling     Suture material, localized swelling at closure site    Sulfamethoxazole-Trimethoprim Rash and Other (See Comments)     Renal failure      Vancomycin Hives and Other (See Comments)     Patient states vancomycin caused kidney injury    Calcitonin     Peanut Oil Other (See Comments)    Potassium Clavulanate     Azathioprine Itching and Rash    Clindamycin Other (See Comments) and Palpitations     Chest pain    Hydralazine Itching and Dizziness           Sensipar [Cinacalcet] Hives        Current Medications:  Current Outpatient Medications   Medication Sig Dispense Refill    acetaminophen (TYLENOL) 160 mg/5 mL solution Take 20.3 mL (650 mg total) by mouth every six (6) hours as needed for fever or pain. 473 mL 0    albuterol HFA 90 mcg/actuation inhaler Inhale 1-2 puffs every four (4) to six (6) hours as needed for wheezing or shortness of breath. 18 g 11    azathioprine (AZASAN) 100 mg tablet Take 2 tablets (200 mg total) by mouth daily. 60 tablet 11    bictegrav-emtricit-tenofov ala (BIKTARVY) 50-200-25 mg tablet Take 1 tablet by mouth every morning. 30 tablet 11    calamine-zinc oxide 8-8 % Lotn Apply topically four (4) times a day. 177 mL 0    carvedilol (COREG) 25 MG tablet Take 2 tablets (50 mg total) by mouth two (2) times a day. 360 tablet 3    cetirizine (ZYRTEC) 10 MG tablet Take 1 tablet (10 mg total) by mouth daily as needed (Itching). (Patient not taking: Reported on 10/27/2022) 30 tablet 11    dapsone 100 MG tablet Take 1 tablet (100 mg total) by mouth daily. START taking on 10/05/22 30 tablet 4    diphenhydrAMINE (BENADRYL) 25 mg capsule Take 1 capsule (25 mg total) by mouth every six (6) hours as needed for itching. 96 capsule 0    docusate sodium (COLACE) 100 MG capsule Take 1 capsule (100 mg total) by mouth two (2) times a day as needed for constipation. (Patient not taking: Reported on 10/27/2022) 60 capsule 11    hydrocortisone 1 % cream Apply topically two (2) times a day. (Patient not taking: Reported on 10/27/2022) 30 g 11    magnesium oxide-Mg AA chelate (MAGNESIUM, AMINO ACID CHELATE,) 133 mg Take 1 tablet by mouth two (2) times a day. Hold until directed to take by nurse coordinator (Patient not taking: Reported on 10/27/2022) 60 tablet 11    minoxidil (LONITEN) 2.5 MG tablet Take 2 tablets (5 mg total) by mouth two (2) times a day. 180 tablet 3    naloxone (NARCAN) 4 mg nasal spray SPRAY TO 1 NOSTRIL IN CASE OF OPIOID OVERDOSE, MAY REPEAT IN 2-3 MINUTES UNTIL EMS ARRIVES OR RESPONSIVE (Patient not taking: Reported on 10/27/2022)      pantoprazole (PROTONIX) 40 MG tablet Take 1 tablet (40 mg total) by mouth two (2) times a day. 180 tablet 3    predniSONE (DELTASONE) 5 MG tablet Take 1 tablet (5 mg total) by mouth in the morning. 90 tablet 3    prochlorperazine (COMPAZINE) 5 MG tablet Take 1 tablet (5 mg total) by mouth every eight (8) hours as needed for nausea. (Patient not taking: Reported on 10/27/2022) 30 tablet 0    sodium bicarbonate 650 mg tablet Take 1 tablet (650 mg total) by mouth two (2) times a day. (Patient not taking: Reported on 10/27/2022) 180 tablet 3    tacrolimus (PROGRAF) 1 MG capsule Hold (Patient not taking: Reported on 10/27/2022)      tacrolimus (PROGRAF) 5 MG capsule Take 2 capsules (10 mg total) by mouth two (2) times a day. 360 capsule 3    valGANciclovir (VALCYTE) 450 mg tablet Take 1 tablet (450 mg total) by mouth daily. (Patient not taking: Reported on 10/27/2022) 30 tablet 1     Current Facility-Administered Medications   Medication Dose Route Frequency Provider Last Rate Last Admin    cephalexin (KEFLEX) capsule 500 mg  500 mg Oral Once Lewie Chamber, MD  sodium chloride 0.9 % (NS) bag 1,000 mL  1,000 mL Intravenous Once Leafy Half, MD   Stopped at 10/06/22 1550       Past Medical History:   Past Medical History:   Diagnosis Date    Acquired immunodeficiency syndrome (CMS-HCC) 11/14/2013    Formatting of this note might be different from the original.   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   He appears to be doing well. States he is back to 90% except for his leg swelling.    Will change his epivir to 150 mg daily when he completes his liquid rx. Will see him back in 1 month and repeat his labs th    Acute deep vein thrombosis (DVT) of popliteal vein of left lower extremity (CMS-HCC) 02/05/2019    Last Assessment & Plan:   Formatting of this note might be different from the original.  Moderate effusion left knee new the some limitation of motion.  No evidence of sepsis the white count the normal  will     Proceed on with an aspiration for cell count, culture and crystal analysis.  New x-rays ordered and currently pending will follow.  Further therapeutic recommendations contingent on the re    Anemia     Asthma     Candidal esophagitis (CMS-HCC) 07/30/2022    Community acquired pneumonia 07/30/2014    DVT (deep venous thrombosis) (CMS-HCC) 11/24/2013    Last Assessment & Plan:    Formatting of this note might be different from the original.   Appears to be doing better (swelling improved). He will f/u with PCP regarding coumadin dosing (INR is low).  Formatting of this note might be different from the original.   Last Assessment & Plan:    Formatting of this note might be different from the original.   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   greatly appreciate renal f/u.  Formatting of this note might be different from the original.   Last Assessment & Plan:    greatly appreciate renal f/u.   Last Assessment & Plan:    greatly appreciate renal f/u.  Formatting of this note might be different from the original.   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        Electronically signed by:   Leafy Half, MD  [facility]

## 2022-11-02 ENCOUNTER — Encounter: Admit: 2022-11-02 | Discharge: 2022-11-02 | Payer: MEDICARE

## 2022-11-02 ENCOUNTER — Ambulatory Visit: Admit: 2022-11-02 | Discharge: 2022-11-02 | Payer: MEDICARE

## 2022-11-02 ENCOUNTER — Ambulatory Visit
Admit: 2022-11-02 | Discharge: 2022-11-02 | Payer: MEDICARE | Attending: Student in an Organized Health Care Education/Training Program | Primary: Student in an Organized Health Care Education/Training Program

## 2022-11-02 DIAGNOSIS — B20 Human immunodeficiency virus [HIV] disease: Principal | ICD-10-CM

## 2022-11-02 DIAGNOSIS — Z940 Kidney transplant status: Principal | ICD-10-CM

## 2022-11-02 DIAGNOSIS — Z79899 Other long term (current) drug therapy: Principal | ICD-10-CM

## 2022-11-02 LAB — URINALYSIS WITH MICROSCOPY
BACTERIA: NONE SEEN /HPF
BILIRUBIN UA: NEGATIVE
BLOOD UA: NEGATIVE
HYALINE CASTS: 1 /LPF (ref 0–1)
KETONES UA: NEGATIVE
LEUKOCYTE ESTERASE UA: NEGATIVE
NITRITE UA: NEGATIVE
PH UA: 6 (ref 5.0–9.0)
RBC UA: 1 /HPF (ref <=3)
SPECIFIC GRAVITY UA: 1.017 (ref 1.003–1.030)
SQUAMOUS EPITHELIAL: <1 /HPF (ref 0–5)
UROBILINOGEN UA: <2
WBC UA: 4 /HPF — ABNORMAL HIGH (ref <=2)

## 2022-11-02 LAB — RENAL FUNCTION PANEL
ALBUMIN: 3.9 g/dL (ref 3.4–5.0)
ANION GAP: 6 mmol/L (ref 5–14)
BLOOD UREA NITROGEN: 16 mg/dL (ref 9–23)
BUN / CREAT RATIO: 8
CALCIUM: 12.6 mg/dL (ref 8.7–10.4)
CHLORIDE: 111 mmol/L — ABNORMAL HIGH (ref 98–107)
CO2: 22.1 mmol/L (ref 20.0–31.0)
CREATININE: 1.9 mg/dL — ABNORMAL HIGH
EGFR CKD-EPI (2021) MALE: 45 mL/min/{1.73_m2} — ABNORMAL LOW (ref >=60)
GLUCOSE RANDOM: 111 mg/dL — ABNORMAL HIGH (ref 70–99)
PHOSPHORUS: 1.5 mg/dL — ABNORMAL LOW (ref 2.4–5.1)
POTASSIUM: 4.6 mmol/L (ref 3.4–4.8)
SODIUM: 139 mmol/L (ref 135–145)

## 2022-11-02 LAB — PROTEIN / CREATININE RATIO, URINE
CREATININE, URINE: 193 mg/dL
PROTEIN URINE: 55.8 mg/dL
PROTEIN/CREAT RATIO, URINE: 0.289

## 2022-11-02 LAB — CBC W/ AUTO DIFF
BASOPHILS ABSOLUTE COUNT: 0 10*9/L (ref 0.0–0.1)
BASOPHILS RELATIVE PERCENT: 1.2 %
EOSINOPHILS ABSOLUTE COUNT: 0.1 10*9/L (ref 0.0–0.5)
EOSINOPHILS RELATIVE PERCENT: 5.1 %
HEMATOCRIT: 31.6 % — ABNORMAL LOW (ref 39.0–48.0)
HEMOGLOBIN: 10.3 g/dL — ABNORMAL LOW (ref 12.9–16.5)
LYMPHOCYTES ABSOLUTE COUNT: 0.2 10*9/L — ABNORMAL LOW (ref 1.1–3.6)
LYMPHOCYTES RELATIVE PERCENT: 8.5 %
MEAN CORPUSCULAR HEMOGLOBIN CONC: 32.6 g/dL (ref 32.0–36.0)
MEAN CORPUSCULAR HEMOGLOBIN: 30.2 pg (ref 25.9–32.4)
MEAN CORPUSCULAR VOLUME: 92.5 fL (ref 77.6–95.7)
MEAN PLATELET VOLUME: 7.7 fL (ref 6.8–10.7)
MONOCYTES ABSOLUTE COUNT: 0.2 10*9/L — ABNORMAL LOW (ref 0.3–0.8)
MONOCYTES RELATIVE PERCENT: 7.4 %
NEUTROPHILS ABSOLUTE COUNT: 1.8 10*9/L (ref 1.8–7.8)
NEUTROPHILS RELATIVE PERCENT: 77.8 %
PLATELET COUNT: 169 10*9/L (ref 150–450)
RED BLOOD CELL COUNT: 3.41 10*12/L — ABNORMAL LOW (ref 4.26–5.60)
RED CELL DISTRIBUTION WIDTH: 18.4 % — ABNORMAL HIGH (ref 12.2–15.2)
WBC ADJUSTED: 2.4 10*9/L — ABNORMAL LOW (ref 3.6–11.2)

## 2022-11-02 LAB — MAGNESIUM: MAGNESIUM: 1.2 mg/dL — ABNORMAL LOW (ref 1.6–2.6)

## 2022-11-02 NOTE — Unmapped (Addendum)
Please add over the counter magnesium (or let me know if you want a prescription). Choose one that does not contain calcium (for example, Slow-Mg has a small amount of calcium in it).    We discussed using alendronate to help with high calcium levels. It is a once weekly medicine. It is not a long-term solution but can help with high calciums for next few weeks/months (I think you will need a surgical procedure for your overactive parathyroid hormone; to discuss with Dr. Truitt Merle at the appt 11/28/22). If you do have a surgery with Dr. Selena Batten please let Coleen know so that we can watch your labs very closely.

## 2022-11-02 NOTE — Unmapped (Signed)
[facility] w/ patient in [facility] today. Reviewed meds/symptoms. Any new medications?                 Fever/cold/flu symptoms denies  BP: 128/77 today/ Home BP reported 130/70  BG: not checking  Headache/Dizziness/Lightheaded: reports headaches - tac level high 11  Hand tremors: denies  Numbness/tingling: denies  Fevers/chills/sweats: denies  CP/SOB/palpatations: denies  Nausea/vomiting/heartburn: denies  Diarrhea/constipation: denies  UTI symptoms (burn/pain/itch/frequency/urgency/odor/color/foam): denies  No visible or palpable edema     Appetite good; reports adequate hydration. 2-2.5 liters fluid per day.     Pt reports being well rested and getting adequate exercise.     Continues to follow Covid/health safety precautions by taking care to mask, perform frequent hand hygeine and minimal public activity. Offered support and guidance for this process given their immune-suppressed state. We discussed reduced covid vaccine coverage for transplant patients and importance of continuing to mask and practice safe distancing. Commented that booster vaccines will likely be advised as an ongoing process.     Pain: denies  Last 0900 taken 2100;  NOT held for this morning's labs. Current dose 10 mg bid; Imuran 200 mg daily, Pred 5 mg daily     Other complaints or concerns: n/a    Referrals needed: sees surg Onc ENT 10/1     Pt Follow up: labs next week - CMV,Tac level     Immunization status: none in clinic today     Functional Score: 100     Employment/work status:  works full time - on disability.

## 2022-11-03 LAB — CMV DNA, QUANTITATIVE, PCR
CMV QUANT LOG(10): 1.67 {Log_IU}/mL — ABNORMAL HIGH (ref <0.00)
CMV QUANT: 47 [IU]/mL — ABNORMAL HIGH (ref <0)
CMV VIRAL LD: DETECTED — AB

## 2022-11-03 NOTE — Unmapped (Signed)
[facility] has been contacted in regards to their refill of valganciclovir. At this time, they have declined refill due to medication being on hold. Refill assessment call date has been updated per the patient's request.

## 2022-11-06 DIAGNOSIS — Z940 Kidney transplant status: Principal | ICD-10-CM

## 2022-11-06 LAB — FSAB CLASS 1 ANTIBODY SPECIFICITY: HLA CLASS 1 ANTIBODY RESULT: POSITIVE

## 2022-11-06 LAB — HLA DS POST TRANSPLANT
ANTI-DONOR DRW #1 MFI: 64 MFI
ANTI-DONOR DRW #2 MFI: 324 MFI
ANTI-DONOR HLA-A #1 MFI: 58 MFI
ANTI-DONOR HLA-A #2 MFI: 24 MFI
ANTI-DONOR HLA-B #1 MFI: 197 MFI
ANTI-DONOR HLA-B #2 MFI: 56 MFI
ANTI-DONOR HLA-C #1 MFI: 50 MFI
ANTI-DONOR HLA-DQB #1 MFI: 0 MFI
ANTI-DONOR HLA-DQB #2 MFI: 3299 MFI — ABNORMAL HIGH
ANTI-DONOR HLA-DR #1 MFI: 25 MFI
ANTI-DONOR HLA-DR #2 MFI: 60 MFI

## 2022-11-06 LAB — FSAB CLASS 2 ANTIBODY SPECIFICITY: HLA CL2 AB RESULT: POSITIVE

## 2022-11-07 DIAGNOSIS — D709 Neutropenia, unspecified: Principal | ICD-10-CM

## 2022-11-07 DIAGNOSIS — Z940 Kidney transplant status: Principal | ICD-10-CM

## 2022-11-07 LAB — CBC W/ DIFFERENTIAL
BANDED NEUTROPHILS ABSOLUTE COUNT: 0 10*3/uL (ref 0.0–0.1)
BASOPHILS ABSOLUTE COUNT: 0 10*3/uL (ref 0.0–0.2)
BASOPHILS RELATIVE PERCENT: 1 %
EOSINOPHILS ABSOLUTE COUNT: 0 10*3/uL (ref 0.0–0.4)
EOSINOPHILS RELATIVE PERCENT: 2 %
HEMATOCRIT: 29.6 % — ABNORMAL LOW (ref 37.5–51.0)
HEMOGLOBIN: 9.5 g/dL — ABNORMAL LOW (ref 13.0–17.7)
IMMATURE GRANULOCYTES: 2 %
LYMPHOCYTES ABSOLUTE COUNT: 0.3 10*3/uL — ABNORMAL LOW (ref 0.7–3.1)
LYMPHOCYTES RELATIVE PERCENT: 22 %
MEAN CORPUSCULAR HEMOGLOBIN CONC: 32.1 g/dL (ref 31.5–35.7)
MEAN CORPUSCULAR HEMOGLOBIN: 30.3 pg (ref 26.6–33.0)
MEAN CORPUSCULAR VOLUME: 94 fL (ref 79–97)
MONOCYTES ABSOLUTE COUNT: 0.2 10*3/uL (ref 0.1–0.9)
MONOCYTES RELATIVE PERCENT: 12 %
NEUTROPHILS ABSOLUTE COUNT: 0.8 10*3/uL — ABNORMAL LOW (ref 1.4–7.0)
NEUTROPHILS RELATIVE PERCENT: 61 %
PLATELET COUNT: 153 10*3/uL (ref 150–450)
RED BLOOD CELL COUNT: 3.14 x10E6/uL — ABNORMAL LOW (ref 4.14–5.80)
RED CELL DISTRIBUTION WIDTH: 16.6 % — ABNORMAL HIGH (ref 11.6–15.4)
WHITE BLOOD CELL COUNT: 1.3 10*3/uL — CL (ref 3.4–10.8)

## 2022-11-07 LAB — RENAL FUNCTION PANEL
ALBUMIN: 3.9 g/dL — ABNORMAL LOW (ref 4.1–5.1)
BLOOD UREA NITROGEN: 17 mg/dL (ref 6–20)
BUN / CREAT RATIO: 8 — ABNORMAL LOW (ref 9–20)
CALCIUM: 12.2 mg/dL — ABNORMAL HIGH (ref 8.7–10.2)
CHLORIDE: 108 mmol/L — ABNORMAL HIGH (ref 96–106)
CO2: 18 mmol/L — ABNORMAL LOW (ref 20–29)
CREATININE: 2.11 mg/dL — ABNORMAL HIGH (ref 0.76–1.27)
EGFR: 40 mL/min/{1.73_m2} — ABNORMAL LOW
GLUCOSE: 95 mg/dL (ref 70–99)
PHOSPHORUS, SERUM: 1.8 mg/dL — ABNORMAL LOW (ref 2.8–4.1)
POTASSIUM: 5.1 mmol/L (ref 3.5–5.2)
SODIUM: 138 mmol/L (ref 134–144)

## 2022-11-07 LAB — MAGNESIUM: MAGNESIUM: 1.3 mg/dL — ABNORMAL LOW (ref 1.6–2.3)

## 2022-11-07 MED ORDER — FILGRASTIM-AAFI 480 MCG/0.8 ML SUBCUTANEOUS SYRINGE
SUBCUTANEOUS | 1 refills | 28 days | Status: CP
Start: 2022-11-07 — End: ?

## 2022-11-08 LAB — CMV DNA, QUANTITATIVE, PCR: CMV QUANT: POSITIVE [IU]/mL

## 2022-11-08 LAB — TACROLIMUS LEVEL: TACROLIMUS BLOOD: 8.4 ng/mL (ref 2.0–20.0)

## 2022-11-09 DIAGNOSIS — D708 Other neutropenia: Principal | ICD-10-CM

## 2022-11-09 DIAGNOSIS — D702 Other drug-induced agranulocytosis: Principal | ICD-10-CM

## 2022-11-09 DIAGNOSIS — Z940 Kidney transplant status: Principal | ICD-10-CM

## 2022-11-09 LAB — CBC W/ DIFFERENTIAL
BANDED NEUTROPHILS ABSOLUTE COUNT: 0 10*3/uL (ref 0.0–0.1)
BASOPHILS ABSOLUTE COUNT: 0 10*3/uL (ref 0.0–0.2)
BASOPHILS RELATIVE PERCENT: 1 %
EOSINOPHILS ABSOLUTE COUNT: 0.1 10*3/uL (ref 0.0–0.4)
EOSINOPHILS RELATIVE PERCENT: 4 %
HEMATOCRIT: 27.9 % — ABNORMAL LOW (ref 37.5–51.0)
HEMOGLOBIN: 9 g/dL — ABNORMAL LOW (ref 13.0–17.7)
IMMATURE GRANULOCYTES: 1 %
LYMPHOCYTES ABSOLUTE COUNT: 0.3 10*3/uL — ABNORMAL LOW (ref 0.7–3.1)
LYMPHOCYTES RELATIVE PERCENT: 21 %
MEAN CORPUSCULAR HEMOGLOBIN CONC: 32.3 g/dL (ref 31.5–35.7)
MEAN CORPUSCULAR HEMOGLOBIN: 29.8 pg (ref 26.6–33.0)
MEAN CORPUSCULAR VOLUME: 92 fL (ref 79–97)
MONOCYTES ABSOLUTE COUNT: 0.2 10*3/uL (ref 0.1–0.9)
MONOCYTES RELATIVE PERCENT: 11 %
NEUTROPHILS ABSOLUTE COUNT: 1 10*3/uL — ABNORMAL LOW (ref 1.4–7.0)
NEUTROPHILS RELATIVE PERCENT: 62 %
PLATELET COUNT: 124 10*3/uL — ABNORMAL LOW (ref 150–450)
RED BLOOD CELL COUNT: 3.02 x10E6/uL — ABNORMAL LOW (ref 4.14–5.80)
RED CELL DISTRIBUTION WIDTH: 16.4 % — ABNORMAL HIGH (ref 11.6–15.4)
WHITE BLOOD CELL COUNT: 1.6 10*3/uL — CL (ref 3.4–10.8)

## 2022-11-09 MED ORDER — ZARXIO 480 MCG/0.8 ML INJECTION SYRINGE
SUBCUTANEOUS | 1 refills | 28 days | Status: CP
Start: 2022-11-09 — End: ?
  Filled 2022-11-21: qty 3.2, 28d supply, fill #0

## 2022-11-10 LAB — RENAL FUNCTION PANEL
ALBUMIN: 4.1 g/dL (ref 4.1–5.1)
BLOOD UREA NITROGEN: 25 mg/dL — ABNORMAL HIGH (ref 6–20)
BUN / CREAT RATIO: 11 (ref 9–20)
CALCIUM: 12 mg/dL — ABNORMAL HIGH (ref 8.7–10.2)
CHLORIDE: 111 mmol/L — ABNORMAL HIGH (ref 96–106)
CO2: 16 mmol/L — ABNORMAL LOW (ref 20–29)
CREATININE: 2.25 mg/dL — ABNORMAL HIGH (ref 0.76–1.27)
EGFR: 37 mL/min/{1.73_m2} — ABNORMAL LOW
GLUCOSE: 98 mg/dL (ref 70–99)
PHOSPHORUS, SERUM: 1.7 mg/dL — ABNORMAL LOW (ref 2.8–4.1)
POTASSIUM: 5.1 mmol/L (ref 3.5–5.2)
SODIUM: 138 mmol/L (ref 134–144)

## 2022-11-10 LAB — MAGNESIUM: MAGNESIUM: 1.2 mg/dL — ABNORMAL LOW (ref 1.6–2.3)

## 2022-11-10 NOTE — Unmapped (Addendum)
 This onboarding is for the following medication:  1) Zarxio      Psychologist, prison and probation services and Home Delivery Pharmacy    Patient Onboarding/Medication Counseling    Christian Brown is a 42 y.o. male with a kidney transplant who I am counseling today on initiation of therapy.  I am speaking to the patient.    Was a Nurse, learning disability used for this call? No    Verified patient's date of birth / HIPAA.    Specialty medication(s) to be sent: Hematology/Oncology: Zarxio      Non-specialty medications/supplies to be sent: empty container      Medications not needed at this time: none         Zarxio (filgrastim)    Medication & Administration     Dosage: Inject 0.63ml ( total) under the skin once a week.  Administer only as directed by transplant team.    Administration: Inject under the skin of the thigh, abdomen, buttocks or upper arm. Rotate sites with each injection.  Injection instructions   Take 1 syringe out of the refrigerator and allow to stand at room temperature for at least 30 minutes  Wash hands and remove syringe from the tray  Check the syringe for the following   Expiration date  Medication is clear and colorless to slightly yellow and free from particles. You may see a small air bubble in the liquid but this is normal.  It appears unused or damaged and the needle cap is securely attached and the needle guard has not been activated  Choose your injection site (abdomen but not within 2 inches of navel, thigh or if someone else is injecting may also use upper arms or upper outer area of buttocks)  Clean the injection site with an alcohol wipe using a circular motion and allow to air dry completely  Hold the prefilled syringe by the body (the clear plastic needle guard) with the needle pointing up.  Carefully pull the needle cap straight off and discard.  Check the syringe for an air bubble.  Gently tap the syringe with your fingers until the air bubble rises to the top of the syringe. Slowly push the plunger up to push any air out of the syringe and stop when you see a small drop start to appear at the needle tip.  Continue to hold the syringe upright and slowly press the plunger to push out the excess medicine until the edge of the plunger stopper lines up with the syringe markings for your prescribed dose (0.3, 0.4 or 0.5)  With one hand gently pinch the skin at the injection site.  With your other hand insert the needle into your skin at a 45-90 degree angle (keep skin pinched while injecting)  Push the plunger head down to deliver dose using a slow and constant pressure until the plunger head reaches the bottom and hold syringe in place for 5 seconds  Keep the plunger fully pressed down while you carefully pull the needle straight out from the injection site.  Slowly release the plunger and allow the needle guard to automatically cover the exposed needle   If there is blood at the injection site gently press a cotton ball or gauze to the site. Do not rub the injection site.  Dispose of the used prefilled syringe into a sharps container or hard plastic bottle.    Adherence/Missed dose instruction: If a dose is missed, call your doctor.    Goals of Therapy     Stimulate the  growth of neutrophils (a type of white blood important to fight against infection) used after chemotherapy.    Side Effects & Monitoring Parameters   Injection site irritation  Pain/aching in the bones, arms and legs    The following side effects should be reported to the provider:  Signs of an allergic reaction    Contraindications, Warnings, & Precautions     Hypersensitivity  Hypersensitivity to latex (needle cap contains latex)    Drug/Food Interactions     Medication list reviewed in Epic. The patient was instructed to inform the care team before taking any new medications or supplements. No drug interactions identified.     Storage, Handling Precautions, & Disposal     Zarxio should be stored in the refrigerator.   Avoid freezing syringe but if frozen may be thawed one time  Throw away Zarxio syringe that has been left at room temperature for more than 24 hours or frozen more than 1 time  Do not shake the prefilled syringe  Keep out of the reach of children  Place used devices into a sharps container for disposal (which we can supply along with band-aids and alcohol pads) or hard plastic container       Current Medications (including OTC/herbals), Comorbidities and Allergies     Current Outpatient Medications   Medication Sig Dispense Refill    acetaminophen (TYLENOL) 160 mg/5 mL solution Take 20.3 mL (650 mg total) by mouth every six (6) hours as needed for fever or pain. 473 mL 0    albuterol HFA 90 mcg/actuation inhaler Inhale 1-2 puffs every four (4) to six (6) hours as needed for wheezing or shortness of breath. 18 g 11    azathioprine (AZASAN) 100 mg tablet Take 2 tablets (200 mg total) by mouth daily. 60 tablet 11    bictegrav-emtricit-tenofov ala (BIKTARVY) 50-200-25 mg tablet Take 1 tablet by mouth every morning. 30 tablet 11    calamine-zinc oxide 8-8 % Lotn Apply topically four (4) times a day. 177 mL 0    carvedilol (COREG) 25 MG tablet Take 2 tablets (50 mg total) by mouth two (2) times a day. 360 tablet 3    cetirizine (ZYRTEC) 10 MG tablet Take 1 tablet (10 mg total) by mouth daily as needed (Itching). (Patient not taking: Reported on 10/27/2022) 30 tablet 11    dapsone 100 MG tablet Take 1 tablet (100 mg total) by mouth daily. START taking on 10/05/22 30 tablet 4    diphenhydrAMINE (BENADRYL) 25 mg capsule Take 1 capsule (25 mg total) by mouth every six (6) hours as needed for itching. 96 capsule 0    docusate sodium (COLACE) 100 MG capsule Take 1 capsule (100 mg total) by mouth two (2) times a day as needed for constipation. (Patient not taking: Reported on 10/27/2022) 60 capsule 11    filgrastim-sndz (ZARXIO) 480 mcg/0.8 mL Syrg Inject 0.8 mL (480 mcg total) under the skin once a week. Administer only as directed by transplant team. 3.2 mL 1 hydrocortisone 1 % cream Apply topically two (2) times a day. (Patient not taking: Reported on 10/27/2022) 30 g 11    magnesium oxide-Mg AA chelate (MAGNESIUM, AMINO ACID CHELATE,) 133 mg Take 1 tablet by mouth two (2) times a day. Hold until directed to take by nurse coordinator (Patient not taking: Reported on 10/27/2022) 60 tablet 11    minoxidil (LONITEN) 2.5 MG tablet Take 2 tablets (5 mg total) by mouth two (2) times a day. 180 tablet 3  naloxone (NARCAN) 4 mg nasal spray SPRAY TO 1 NOSTRIL IN CASE OF OPIOID OVERDOSE, MAY REPEAT IN 2-3 MINUTES UNTIL EMS ARRIVES OR RESPONSIVE (Patient not taking: Reported on 10/27/2022)      pantoprazole (PROTONIX) 40 MG tablet Take 1 tablet (40 mg total) by mouth two (2) times a day. 180 tablet 3    predniSONE (DELTASONE) 5 MG tablet Take 1 tablet (5 mg total) by mouth in the morning. 90 tablet 3    prochlorperazine (COMPAZINE) 5 MG tablet Take 1 tablet (5 mg total) by mouth every eight (8) hours as needed for nausea. (Patient not taking: Reported on 10/27/2022) 30 tablet 0    sodium bicarbonate 650 mg tablet Take 1 tablet (650 mg total) by mouth two (2) times a day. (Patient not taking: Reported on 10/27/2022) 180 tablet 3    tacrolimus (PROGRAF) 1 MG capsule Hold (Patient not taking: Reported on 10/27/2022)      tacrolimus (PROGRAF) 5 MG capsule Take 2 capsules (10 mg total) by mouth two (2) times a day. 360 capsule 3    valGANciclovir (VALCYTE) 450 mg tablet Take 1 tablet (450 mg total) by mouth daily. (Patient not taking: Reported on 10/27/2022) 30 tablet 1     Current Facility-Administered Medications   Medication Dose Route Frequency Provider Last Rate Last Admin    cephalexin (KEFLEX) capsule 500 mg  500 mg Oral Once Lewie Chamber, MD        sodium chloride 0.9 % (NS) bag 1,000 mL  1,000 mL Intravenous Once Leafy Half, MD   Stopped at 10/06/22 1550       Allergies   Allergen Reactions    Amlodipine Hives, Palpitations, Rash and Other (See Comments) Causes fatigue      Doxercalciferol Hives    Fish Containing Products Hives and Shortness Of Breath     Please do not serve fish for meals. Highly allergic         Iron Sucrose Hives    Labetalol Itching and Rash    Metoclopramide Hives and Other (See Comments)     Causes ticks/ jerks    Metoprolol Hives    Nifedipine Hives, Itching and Shortness Of Breath    Other Swelling     Suture material, localized swelling at closure site    Sulfamethoxazole-Trimethoprim Rash and Other (See Comments)     Renal failure      Vancomycin Hives and Other (See Comments)     Patient states vancomycin caused kidney injury    Calcitonin     Peanut Oil Other (See Comments)    Potassium Clavulanate     Azathioprine Itching and Rash    Clindamycin Other (See Comments) and Palpitations     Chest pain    Hydralazine Itching and Dizziness           Sensipar [Cinacalcet] Hives       Patient Active Problem List   Diagnosis    Asthma    CMV (cytomegalovirus infection) (CMS-HCC)    Essential hypertension    HIV infection (CMS-HCC)    OSA (obstructive sleep apnea)    Other specified disorders of parathyroid gland (CMS-HCC)    Multiple drug allergies    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)    Dialysis AV fistula malfunction (CMS-HCC)    Acute blood loss anemia    Melena    Abdominal pain    Nephropathy associated with AIDS (CMS-HCC)    Anemia of chronic  disease    GERD (gastroesophageal reflux disease)    Chest pressure    Chronic deep vein thrombosis (DVT) of femoral vein of left lower extremity (CMS-HCC)    Chronic diarrhea    Dependence on renal dialysis (CMS-HCC)    Left ventricular hypertrophy    Mild intermittent asthma without complication    Mitral valve disorder    Neuropathy involving both lower extremities    Nonrheumatic mitral valve stenosis    Pulmonary hypertension (CMS-HCC)    Renovascular hypertension    Iron deficiency anemia    End stage renal failure on dialysis (CMS-HCC)    History of DVT (deep vein thrombosis)    Transplanted kidney    History of anemia due to CKD    Anemia due to stage 4 chronic kidney disease (CMS-HCC)    Other neutropenia (CMS-HCC)       Reviewed and up to date in Epic.    Appropriateness of Therapy     Acute infections noted within Epic:  No active infections  Patient reported infection: None    Is the medication and dose appropriate based on diagnosis, medication list, comorbidities, allergies, medical history, patient???s ability to self-administer the medication, and therapeutic goals? Yes    Prescription has been clinically reviewed: Yes      Baseline Quality of Life Assessment      How many days over the past month did your kidney transplant  keep you from your normal activities? For example, brushing your teeth or getting up in the morning. 0    Financial Information     Medication Assistance provided: Prior Authorization    Anticipated copay of $0 reviewed with patient. Verified delivery address.    Delivery Information     Scheduled delivery date: 11/21/22    Expected start date: Will administer per transplant team direction.      Medication will be delivered via UPS to the prescription address in [facility].  This shipment will not require a signature.      Explained the services we provide at [facility] and that each month we would call to set up refills.  Stressed importance of returning phone calls so that we could ensure they receive their medications in time each month.  Informed patient that we should be setting up refills 7-10 days prior to when they will run out of medication.  A pharmacist will reach out to perform a clinical assessment periodically.  Informed patient that a welcome packet, containing information about our pharmacy and other support services, a Notice of Privacy Practices, and a drug information handout will be sent.      The patient or caregiver noted above participated in the development of this care plan and knows that they can request review of or adjustments to the care plan at any time.      Patient or caregiver verbalized understanding of the above information as well as how to contact the pharmacy at [phone] option 4 with any questions/concerns.  The pharmacy is open Monday through Friday 8:30am-4:30pm.  A pharmacist is available 24/7 via pager to answer any clinical questions they may have.    Patient Specific Needs     Does the patient have any physical, cognitive, or cultural barriers? No    Does the patient have adequate living arrangements? (i.e. the ability to store and take their medication appropriately) Yes    Did you identify any home environmental safety or security hazards? No  Patient prefers to have medications discussed with  Patient     Is the patient or caregiver able to read and understand education materials at a high school level or above? Yes    Patient's primary language is  English     Is the patient high risk? Yes, patient is taking a REMS drug. Medication is dispensed in compliance with REMS program    SOCIAL DETERMINANTS OF HEALTH     At the [facility], we have learned that life circumstances - like trouble affording food, housing, utilities, or transportation can affect the health of many of our patients.   That is why we wanted to ask: are you currently experiencing any life circumstances that are negatively impacting your health and/or quality of life? Patient declined to answer    Social Determinants of Health     Financial Resource Strain: Low Risk  (09/06/2022)    Overall Financial Resource Strain (CARDIA)     Difficulty of Paying Living Expenses: Not hard at all   Internet Connectivity: Not on file   Food Insecurity: No Food Insecurity (09/06/2022)    Hunger Vital Sign     Worried About Running Out of Food in the Last Year: Never true     Ran Out of Food in the Last Year: Never true   Tobacco Use: Low Risk  (11/02/2022)    Patient History     Smoking Tobacco Use: Never Smokeless Tobacco Use: Never     Passive Exposure: Not on file   Housing/Utilities: Low Risk  (09/06/2022)    Housing/Utilities     Within the past 12 months, have you ever stayed: outside, in a car, in a tent, in an overnight shelter, or temporarily in someone else's home (i.e. couch-surfing)?: No     Are you worried about losing your housing?: No     Within the past 12 months, have you been unable to get utilities (heat, electricity) when it was really needed?: No   Alcohol Use: Not At Risk (09/05/2018)    [facility] [place]    AUDIT-C     Frequency of Alcohol Consumption: Never     Average Number of Drinks: Not on file     Frequency of Binge Drinking: Not on file   Transportation Needs: No Transportation Needs (09/06/2022)    PRAPARE - Transportation     Lack of Transportation (Medical): No     Lack of Transportation (Non-Medical): No   Substance Use: Not on file   Health Literacy: Not on file   Physical Activity: Not on file   Interpersonal Safety: Unknown (11/10/2022)    Interpersonal Safety     Unsafe Where You Currently Live: Not on file     Physically Hurt by Anyone: Not on file     Abused by Anyone: Not on file   Stress: Not on file   Intimate Partner Violence: Patient Declined (03/05/2022)    Received from [facility] [place], [facility] [place], Afraid, Rape, and Kick questionnaire     Fear of Current or Ex-Partner: Patient declined     Emotionally Abused: Patient declined     Physically Abused: Patient declined     Sexually Abused: Patient declined   Depression: Not at risk (11/08/2021)    Received from [employer], [facility]    PHQ-2     Depression Risk: 0   Social  Connections: Unknown (07/07/2021)    Received from [facility]    Social Network     Social Network: Not on file       Would you be willing to receive help with any of the needs that you have identified today? Not applicable       Tera Helper, [facility]  [facility] Delivery [facility] Specialty Pharmacist

## 2022-11-10 NOTE — Unmapped (Signed)
[facility] Specialty Medication Onboarding    Specialty Medication: Zarxio  Prior Authorization: Approved   Financial Assistance: No - copay  <$25  Final Copay/Day Supply: $0 / 28    Insurance Restrictions: None     Notes to Pharmacist:   Credit Card on File: not applicable    The triage team has completed the benefits investigation and has determined that the patient is able to fill this medication at [facility]. Please contact the patient to complete the onboarding or follow up with the prescribing physician as needed.

## 2022-11-11 LAB — TACROLIMUS LEVEL: TACROLIMUS BLOOD: 10.1 ng/mL (ref 2.0–20.0)

## 2022-11-13 DIAGNOSIS — Z940 Kidney transplant status: Principal | ICD-10-CM

## 2022-11-14 DIAGNOSIS — Z940 Kidney transplant status: Principal | ICD-10-CM

## 2022-11-14 LAB — CBC W/ DIFFERENTIAL
BANDED NEUTROPHILS ABSOLUTE COUNT: 0 10*3/uL (ref 0.0–0.1)
BASOPHILS ABSOLUTE COUNT: 0 10*3/uL (ref 0.0–0.2)
BASOPHILS RELATIVE PERCENT: 1 %
EOSINOPHILS ABSOLUTE COUNT: 0.1 10*3/uL (ref 0.0–0.4)
EOSINOPHILS RELATIVE PERCENT: 6 %
HEMATOCRIT: 26.3 % — ABNORMAL LOW (ref 37.5–51.0)
HEMOGLOBIN: 8.4 g/dL — ABNORMAL LOW (ref 13.0–17.7)
IMMATURE GRANULOCYTES: 1 %
LYMPHOCYTES ABSOLUTE COUNT: 0.2 10*3/uL — ABNORMAL LOW (ref 0.7–3.1)
LYMPHOCYTES RELATIVE PERCENT: 17 %
MEAN CORPUSCULAR HEMOGLOBIN CONC: 31.9 g/dL (ref 31.5–35.7)
MEAN CORPUSCULAR HEMOGLOBIN: 29.2 pg (ref 26.6–33.0)
MEAN CORPUSCULAR VOLUME: 91 fL (ref 79–97)
MONOCYTES ABSOLUTE COUNT: 0.2 10*3/uL (ref 0.1–0.9)
MONOCYTES RELATIVE PERCENT: 16 %
NEUTROPHILS ABSOLUTE COUNT: 0.6 10*3/uL — ABNORMAL LOW (ref 1.4–7.0)
NEUTROPHILS RELATIVE PERCENT: 59 %
NUCLEATED RED BLOOD CELLS: 2 % — ABNORMAL HIGH (ref 0–0)
PLATELET COUNT: 105 10*3/uL — ABNORMAL LOW (ref 150–450)
RED BLOOD CELL COUNT: 2.88 x10E6/uL — ABNORMAL LOW (ref 4.14–5.80)
RED CELL DISTRIBUTION WIDTH: 17.8 % — ABNORMAL HIGH (ref 11.6–15.4)
WHITE BLOOD CELL COUNT: 1.1 10*3/uL — CL (ref 3.4–10.8)

## 2022-11-14 LAB — RENAL FUNCTION PANEL
ALBUMIN: 4.1 g/dL (ref 4.1–5.1)
BLOOD UREA NITROGEN: 17 mg/dL (ref 6–20)
BUN / CREAT RATIO: 9 (ref 9–20)
CALCIUM: 12.4 mg/dL — ABNORMAL HIGH (ref 8.7–10.2)
CHLORIDE: 108 mmol/L — ABNORMAL HIGH (ref 96–106)
CO2: 18 mmol/L — ABNORMAL LOW (ref 20–29)
CREATININE: 1.92 mg/dL — ABNORMAL HIGH (ref 0.76–1.27)
EGFR: 45 mL/min/{1.73_m2} — ABNORMAL LOW
GLUCOSE: 96 mg/dL (ref 70–99)
PHOSPHORUS, SERUM: 1.6 mg/dL — ABNORMAL LOW (ref 2.8–4.1)
POTASSIUM: 5 mmol/L (ref 3.5–5.2)
SODIUM: 137 mmol/L (ref 134–144)

## 2022-11-14 LAB — MAGNESIUM: MAGNESIUM: 1.3 mg/dL — ABNORMAL LOW (ref 1.6–2.3)

## 2022-11-14 NOTE — Unmapped (Signed)
Called patient to discuss recent labs and [facility] 1.1 - pt advised to please call the [facility] to arrange delivery of zarxio for home use.  Pt urged to either call this [facility] or pharmacy today, numbers provided.

## 2022-11-15 LAB — CMV DNA, QUANTITATIVE, PCR: CMV QUANT: POSITIVE [IU]/mL

## 2022-11-15 LAB — TACROLIMUS LEVEL: TACROLIMUS BLOOD: 6.2 ng/mL (ref 2.0–20.0)

## 2022-11-15 NOTE — Unmapped (Signed)
Pt returned call and advised to call the [facility] pharmacy to arrange delivery of Zarxio.  Pt advised that his mother could give him an injection and she will be home until Tuesday next week.  Pt will call today to schedule.  Pt reports he has been taking magnesium gummys bid.  He also reports increased fatigue.  He did recently start back to work but also has hgb trending down with most recent 8.44.  he will repeat labs  tomorrow and will discuss if aranesp is needed.  Pt in agreement with plan.

## 2022-11-16 MED ORDER — EMPTY CONTAINER
0 refills | 0 days
Start: 2022-11-16 — End: ?

## 2022-11-17 LAB — RENAL FUNCTION PANEL
ALBUMIN: 4.1 g/dL (ref 4.1–5.1)
BLOOD UREA NITROGEN: 15 mg/dL (ref 6–20)
BUN / CREAT RATIO: 8 — ABNORMAL LOW (ref 9–20)
CALCIUM: 12.2 mg/dL — ABNORMAL HIGH (ref 8.7–10.2)
CHLORIDE: 108 mmol/L — ABNORMAL HIGH (ref 96–106)
CO2: 17 mmol/L — ABNORMAL LOW (ref 20–29)
CREATININE: 1.8 mg/dL — ABNORMAL HIGH (ref 0.76–1.27)
EGFR: 48 mL/min/{1.73_m2} — ABNORMAL LOW
GLUCOSE: 101 mg/dL — ABNORMAL HIGH (ref 70–99)
PHOSPHORUS, SERUM: 1.7 mg/dL — ABNORMAL LOW (ref 2.8–4.1)
POTASSIUM: 4.8 mmol/L (ref 3.5–5.2)
SODIUM: 139 mmol/L (ref 134–144)

## 2022-11-17 LAB — MAGNESIUM: MAGNESIUM: 1.4 mg/dL — ABNORMAL LOW (ref 1.6–2.3)

## 2022-11-18 LAB — CBC W/ DIFFERENTIAL
BANDED NEUTROPHILS ABSOLUTE COUNT: 0 10*3/uL (ref 0.0–0.1)
BASOPHILS ABSOLUTE COUNT: 0 10*3/uL (ref 0.0–0.2)
BASOPHILS RELATIVE PERCENT: 1 %
EOSINOPHILS ABSOLUTE COUNT: 0.1 10*3/uL (ref 0.0–0.4)
EOSINOPHILS RELATIVE PERCENT: 7 %
HEMATOCRIT: 26.1 % — ABNORMAL LOW (ref 37.5–51.0)
HEMOGLOBIN: 8.6 g/dL — ABNORMAL LOW (ref 13.0–17.7)
IMMATURE GRANULOCYTES: 2 %
LYMPHOCYTES ABSOLUTE COUNT: 0.3 10*3/uL — ABNORMAL LOW (ref 0.7–3.1)
LYMPHOCYTES RELATIVE PERCENT: 25 %
MEAN CORPUSCULAR HEMOGLOBIN CONC: 33 g/dL (ref 31.5–35.7)
MEAN CORPUSCULAR HEMOGLOBIN: 30.4 pg (ref 26.6–33.0)
MEAN CORPUSCULAR VOLUME: 92 fL (ref 79–97)
MONOCYTES ABSOLUTE COUNT: 0.2 10*3/uL (ref 0.1–0.9)
MONOCYTES RELATIVE PERCENT: 20 %
NEUTROPHILS ABSOLUTE COUNT: 0.5 10*3/uL — ABNORMAL LOW (ref 1.4–7.0)
NEUTROPHILS RELATIVE PERCENT: 45 %
PLATELET COUNT: 146 10*3/uL — ABNORMAL LOW (ref 150–450)
RED BLOOD CELL COUNT: 2.83 x10E6/uL — ABNORMAL LOW (ref 4.14–5.80)
RED CELL DISTRIBUTION WIDTH: 18 % — ABNORMAL HIGH (ref 11.6–15.4)
WHITE BLOOD CELL COUNT: 1.1 10*3/uL — CL (ref 3.4–10.8)

## 2022-11-18 LAB — TACROLIMUS LEVEL: TACROLIMUS BLOOD: 2.9 ng/mL (ref 2.0–20.0)

## 2022-11-20 DIAGNOSIS — Z940 Kidney transplant status: Principal | ICD-10-CM

## 2022-11-20 DIAGNOSIS — Z79899 Other long term (current) drug therapy: Principal | ICD-10-CM

## 2022-11-20 MED ORDER — PREDNISONE 5 MG TABLET
ORAL_TABLET | Freq: Every day | ORAL | 3 refills | 90 days | Status: CP
Start: 2022-11-20 — End: 2023-11-20

## 2022-11-20 MED ORDER — ALBUTEROL SULFATE HFA 90 MCG/ACTUATION AEROSOL INHALER
Freq: Four times a day (QID) | RESPIRATORY_TRACT | 11 refills | 17 days | Status: CP | PRN
Start: 2022-11-20 — End: ?

## 2022-11-20 MED ORDER — AZATHIOPRINE 100 MG TABLET
ORAL_TABLET | Freq: Every day | ORAL | 3 refills | 90 days | Status: CP
Start: 2022-11-20 — End: 2023-11-20

## 2022-11-21 DIAGNOSIS — N189 Chronic kidney disease, unspecified: Principal | ICD-10-CM

## 2022-11-21 DIAGNOSIS — Z940 Kidney transplant status: Principal | ICD-10-CM

## 2022-11-21 DIAGNOSIS — D631 Anemia in chronic kidney disease: Principal | ICD-10-CM

## 2022-11-21 DIAGNOSIS — Z79899 Other long term (current) drug therapy: Principal | ICD-10-CM

## 2022-11-21 LAB — CBC W/ DIFFERENTIAL
BASOPHILS ABSOLUTE COUNT: 0 10*3/uL (ref 0.0–0.2)
BASOPHILS RELATIVE PERCENT: 2 %
EOSINOPHILS ABSOLUTE COUNT: 0 10*3/uL (ref 0.0–0.4)
EOSINOPHILS RELATIVE PERCENT: 3 %
HEMATOCRIT: 21.2 % — ABNORMAL LOW (ref 37.5–51.0)
HEMOGLOBIN: 7 g/dL — CL (ref 13.0–17.7)
LYMPHOCYTES ABSOLUTE COUNT: 0.4 10*3/uL — ABNORMAL LOW (ref 0.7–3.1)
LYMPHOCYTES RELATIVE PERCENT: 38 %
MEAN CORPUSCULAR HEMOGLOBIN CONC: 33 g/dL (ref 31.5–35.7)
MEAN CORPUSCULAR HEMOGLOBIN: 31 pg (ref 26.6–33.0)
MEAN CORPUSCULAR VOLUME: 94 fL (ref 79–97)
MONOCYTES ABSOLUTE COUNT: 0.1 10*3/uL (ref 0.1–0.9)
MONOCYTES RELATIVE PERCENT: 12 %
NEUTROPHILS ABSOLUTE COUNT: 0.5 10*3/uL — ABNORMAL LOW (ref 1.4–7.0)
NEUTROPHILS RELATIVE PERCENT: 45 %
NUCLEATED RED BLOOD CELLS: 1 % — ABNORMAL HIGH (ref 0–0)
PLATELET COUNT: 214 10*3/uL (ref 150–450)
RED BLOOD CELL COUNT: 2.26 x10E6/uL — CL (ref 4.14–5.80)
RED CELL DISTRIBUTION WIDTH: 19.2 % — ABNORMAL HIGH (ref 11.6–15.4)
WHITE BLOOD CELL COUNT: 1.1 10*3/uL — CL (ref 3.4–10.8)

## 2022-11-21 LAB — RENAL FUNCTION PANEL
ALBUMIN: 3.9 g/dL — ABNORMAL LOW (ref 4.1–5.1)
BLOOD UREA NITROGEN: 17 mg/dL (ref 6–20)
BUN / CREAT RATIO: 9 (ref 9–20)
CALCIUM: 11.6 mg/dL — ABNORMAL HIGH (ref 8.7–10.2)
CHLORIDE: 111 mmol/L — ABNORMAL HIGH (ref 96–106)
CO2: 19 mmol/L — ABNORMAL LOW (ref 20–29)
CREATININE: 1.85 mg/dL — ABNORMAL HIGH (ref 0.76–1.27)
EGFR: 47 mL/min/{1.73_m2} — ABNORMAL LOW
GLUCOSE: 105 mg/dL — ABNORMAL HIGH (ref 70–99)
PHOSPHORUS, SERUM: 1.9 mg/dL — ABNORMAL LOW (ref 2.8–4.1)
POTASSIUM: 4.6 mmol/L (ref 3.5–5.2)
SODIUM: 140 mmol/L (ref 134–144)

## 2022-11-21 LAB — TACROLIMUS LEVEL: TACROLIMUS BLOOD: 9 ng/mL (ref 2.0–20.0)

## 2022-11-21 LAB — MAGNESIUM: MAGNESIUM: 1.4 mg/dL — ABNORMAL LOW (ref 1.6–2.3)

## 2022-11-21 MED ORDER — TACROLIMUS 5 MG CAPSULE, IMMEDIATE-RELEASE
ORAL_CAPSULE | Freq: Two times a day (BID) | ORAL | 3 refills | 90 days | Status: CP
Start: 2022-11-21 — End: ?

## 2022-11-21 MED ORDER — AZATHIOPRINE 50 MG TABLET
ORAL_TABLET | Freq: Every day | ORAL | 11 refills | 30 days | Status: CP
Start: 2022-11-21 — End: 2023-11-21
  Filled 2022-11-22: qty 30, 30d supply, fill #0

## 2022-11-21 MED ORDER — AZATHIOPRINE 100 MG TABLET
ORAL_TABLET | Freq: Every day | ORAL | 3 refills | 90.00000 days | Status: CP
Start: 2022-11-21 — End: 2022-11-21

## 2022-11-21 MED FILL — EMPTY CONTAINER: 30 days supply | Qty: 1 | Fill #0

## 2022-11-21 NOTE — Unmapped (Signed)
Reviewed recent labs with [name], pt agreed to come to [facility] tomorrow for aranesp injection or hgb 7.0 and will get lab draw as well, additional anemia lans ordered per [name].  Pt reports he is feeling better, denies CP or SOB, reports fatigue has improved.  He is expecting zarxio delivery today and will administer a dose on arrival.      Pt also advised per [name] to reduce Imuran dose to 150 mg daily, will order 50 mg tablets to [facility] pharmacy for pick up tomorrow.  Pt verbalized understanding.

## 2022-11-22 ENCOUNTER — Institutional Professional Consult (permissible substitution): Admit: 2022-11-22 | Discharge: 2022-11-23 | Payer: MEDICARE

## 2022-11-22 DIAGNOSIS — N189 Chronic kidney disease, unspecified: Principal | ICD-10-CM

## 2022-11-22 DIAGNOSIS — Z940 Kidney transplant status: Principal | ICD-10-CM

## 2022-11-22 DIAGNOSIS — N184 Chronic kidney disease, stage 4 (severe): Principal | ICD-10-CM

## 2022-11-22 DIAGNOSIS — D708 Other neutropenia: Principal | ICD-10-CM

## 2022-11-22 DIAGNOSIS — Z79899 Other long term (current) drug therapy: Principal | ICD-10-CM

## 2022-11-22 DIAGNOSIS — D631 Anemia in chronic kidney disease: Principal | ICD-10-CM

## 2022-11-22 LAB — CBC W/ AUTO DIFF
BASOPHILS ABSOLUTE COUNT: 0 10*9/L (ref 0.0–0.1)
BASOPHILS RELATIVE PERCENT: 0.9 %
EOSINOPHILS ABSOLUTE COUNT: 0 10*9/L (ref 0.0–0.5)
EOSINOPHILS RELATIVE PERCENT: 3.3 %
HEMATOCRIT: 21.1 % — ABNORMAL LOW (ref 39.0–48.0)
HEMOGLOBIN: 7 g/dL — ABNORMAL LOW (ref 12.9–16.5)
LYMPHOCYTES ABSOLUTE COUNT: 0.3 10*9/L — ABNORMAL LOW (ref 1.1–3.6)
LYMPHOCYTES RELATIVE PERCENT: 25.1 %
MEAN CORPUSCULAR HEMOGLOBIN CONC: 33.3 g/dL (ref 32.0–36.0)
MEAN CORPUSCULAR HEMOGLOBIN: 31.6 pg (ref 25.9–32.4)
MEAN CORPUSCULAR VOLUME: 94.8 fL (ref 77.6–95.7)
MEAN PLATELET VOLUME: 6.9 fL (ref 6.8–10.7)
MONOCYTES ABSOLUTE COUNT: 0.2 10*9/L — ABNORMAL LOW (ref 0.3–0.8)
MONOCYTES RELATIVE PERCENT: 22.3 %
NEUTROPHILS ABSOLUTE COUNT: 0.5 10*9/L — ABNORMAL LOW (ref 1.8–7.8)
NEUTROPHILS RELATIVE PERCENT: 48.4 %
PLATELET COUNT: 245 10*9/L (ref 150–450)
RED BLOOD CELL COUNT: 2.23 10*12/L — ABNORMAL LOW (ref 4.26–5.60)
RED CELL DISTRIBUTION WIDTH: 21.7 % — ABNORMAL HIGH (ref 12.2–15.2)
WBC ADJUSTED: 1.1 10*9/L — CL (ref 3.6–11.2)

## 2022-11-22 LAB — BASIC METABOLIC PANEL
ANION GAP: 5 mmol/L (ref 5–14)
BLOOD UREA NITROGEN: 18 mg/dL (ref 9–23)
BUN / CREAT RATIO: 10
CALCIUM: 12.5 mg/dL (ref 8.7–10.4)
CHLORIDE: 112 mmol/L — ABNORMAL HIGH (ref 98–107)
CO2: 22.5 mmol/L (ref 20.0–31.0)
CREATININE: 1.84 mg/dL — ABNORMAL HIGH
EGFR CKD-EPI (2021) MALE: 47 mL/min/{1.73_m2} — ABNORMAL LOW (ref >=60)
GLUCOSE RANDOM: 102 mg/dL (ref 70–179)
POTASSIUM: 5 mmol/L — ABNORMAL HIGH (ref 3.4–4.8)
SODIUM: 139 mmol/L (ref 135–145)

## 2022-11-22 LAB — HEPATIC FUNCTION PANEL
ALBUMIN: 3.6 g/dL (ref 3.4–5.0)
ALKALINE PHOSPHATASE: 316 U/L — ABNORMAL HIGH (ref 46–116)
ALT (SGPT): <7 U/L — ABNORMAL LOW (ref 10–49)
AST (SGOT): 10 U/L (ref <=34)
BILIRUBIN DIRECT: 0.9 mg/dL — ABNORMAL HIGH (ref 0.00–0.30)
BILIRUBIN TOTAL: 2.3 mg/dL — ABNORMAL HIGH (ref 0.3–1.2)
PROTEIN TOTAL: 7.4 g/dL (ref 5.7–8.2)

## 2022-11-22 LAB — CMV DNA, QUANTITATIVE, PCR: CMV QUANT: POSITIVE [IU]/mL

## 2022-11-22 LAB — RETICULOCYTES
RETICULOCYTE ABSOLUTE COUNT: 80.7 10*9/L (ref 23.0–100.0)
RETICULOCYTE COUNT PCT: 3.62 % — ABNORMAL HIGH (ref 0.50–2.17)

## 2022-11-22 LAB — LACTATE DEHYDROGENASE: LACTATE DEHYDROGENASE: 217 U/L (ref 120–246)

## 2022-11-22 LAB — FOLATE: FOLATE: 5.9 ng/mL (ref >=5.4)

## 2022-11-22 LAB — MAGNESIUM: MAGNESIUM: 1.3 mg/dL — ABNORMAL LOW (ref 1.6–2.6)

## 2022-11-22 LAB — VITAMIN B12: VITAMIN B-12: 320 pg/mL (ref 211–911)

## 2022-11-22 LAB — PHOSPHORUS: PHOSPHORUS: 1.8 mg/dL — ABNORMAL LOW (ref 2.4–5.1)

## 2022-11-22 LAB — HAPTOGLOBIN: HAPTOGLOBIN: 58 mg/dL (ref 30–200)

## 2022-11-22 LAB — TSH: THYROID STIMULATING HORMONE: 1.981 u[IU]/mL (ref 0.550–4.780)

## 2022-11-22 MED ORDER — TACROLIMUS 5 MG CAPSULE, IMMEDIATE-RELEASE
ORAL_CAPSULE | Freq: Two times a day (BID) | ORAL | 3 refills | 90 days | Status: CP
Start: 2022-11-22 — End: ?

## 2022-11-22 MED ORDER — TACROLIMUS 1 MG CAPSULE, IMMEDIATE-RELEASE
ORAL_CAPSULE | Freq: Two times a day (BID) | ORAL | 11 refills | 30 days | Status: CP
Start: 2022-11-22 — End: 2023-11-22

## 2022-11-22 MED ADMIN — filgrastim-aafi (NIVESTYM) injection syringe 480 mcg: 480 ug | SUBCUTANEOUS | @ 14:00:00 | Stop: 2022-11-22

## 2022-11-22 MED ADMIN — darbepoetin alfa-polysorbate (ARANESP) injection 100 mcg: 100 ug | SUBCUTANEOUS | @ 14:00:00 | Stop: 2022-11-22

## 2022-11-22 NOTE — Unmapped (Addendum)
[facility] Pharmacist has reviewed a new prescription for tacrolimus that indicates a dose increase.  Patient was counseled on this dosage change by coordinator C C- see epic note from 9/24.  Next refill call date adjusted if necessary.        Clinical Assessment Needed For: Dose Change  Medication: Tacrolimus 5mg  capsule  Last Fill Date/Day Supply: 10/24/2022 / 30 days  Copay $0  Was previous dose already scheduled to fill: No    Notes to Pharmacist: No Rx received for 1mg  capsules required for 11mg  BID dose.

## 2022-11-23 LAB — PARVOVIRUS PCR, BLOOD: PARVO PCR, BLD: NEGATIVE

## 2022-11-23 LAB — TACROLIMUS LEVEL, TROUGH: TACROLIMUS, TROUGH: 4.6 ng/mL — ABNORMAL LOW (ref 5.0–15.0)

## 2022-11-23 NOTE — Unmapped (Signed)
Patient advised to stop dapsone due to low hgb 7.0.  Pt advised he will  need to re-start atovaquone daily for PJP prophy.  Detailed message sent for where to send script if patient is willing to take.  He has previously declined due to N/V with atovaquone.

## 2022-11-25 LAB — CMV T-CELL IMMUNITY PANEL

## 2022-11-27 DIAGNOSIS — Z940 Kidney transplant status: Principal | ICD-10-CM

## 2022-11-27 DIAGNOSIS — Z79899 Other long term (current) drug therapy: Principal | ICD-10-CM

## 2022-11-27 MED ORDER — AZATHIOPRINE 100 MG TABLET
ORAL_TABLET | Freq: Every day | ORAL | 3 refills | 90 days | Status: CP
Start: 2022-11-27 — End: 2023-11-27

## 2022-11-27 MED ORDER — TACROLIMUS 1 MG CAPSULE, IMMEDIATE-RELEASE
ORAL_CAPSULE | Freq: Two times a day (BID) | ORAL | 3 refills | 90 days | Status: CP
Start: 2022-11-27 — End: 2023-11-27
  Filled 2022-11-28: qty 60, 30d supply, fill #0
  Filled 2022-11-28: qty 120, 30d supply, fill #0

## 2022-11-27 MED ORDER — AZATHIOPRINE 50 MG TABLET
ORAL_TABLET | Freq: Every day | ORAL | 11 refills | 30 days | Status: CP
Start: 2022-11-27 — End: 2023-11-27
  Filled 2022-11-28: qty 30, 30d supply, fill #0

## 2022-11-27 MED ORDER — MINOXIDIL 2.5 MG TABLET
ORAL_TABLET | Freq: Two times a day (BID) | ORAL | 3 refills | 90 days | Status: CP
Start: 2022-11-27 — End: ?
  Filled 2022-11-30: qty 180, 90d supply, fill #0

## 2022-11-27 MED ORDER — PREDNISONE 5 MG TABLET
ORAL_TABLET | Freq: Every day | ORAL | 3 refills | 90 days | Status: CP
Start: 2022-11-27 — End: 2023-11-27
  Filled 2022-11-28: qty 30, 30d supply, fill #0

## 2022-11-27 MED ORDER — ATOVAQUONE 750 MG/5 ML ORAL SUSPENSION
Freq: Every day | ORAL | 2 refills | 30 days | Status: CP
Start: 2022-11-27 — End: 2023-01-28
  Filled 2022-11-28: qty 300, 30d supply, fill #0

## 2022-11-27 MED ORDER — TACROLIMUS 5 MG CAPSULE, IMMEDIATE-RELEASE
ORAL_CAPSULE | Freq: Two times a day (BID) | ORAL | 3 refills | 90 days | Status: CP
Start: 2022-11-27 — End: 2023-11-27

## 2022-11-27 MED ORDER — ALBUTEROL SULFATE HFA 90 MCG/ACTUATION AEROSOL INHALER
Freq: Four times a day (QID) | RESPIRATORY_TRACT | 11 refills | 17 days | Status: CP | PRN
Start: 2022-11-27 — End: ?
  Filled 2022-12-04: qty 18, 17d supply, fill #0

## 2022-11-27 NOTE — Unmapped (Addendum)
[facility] Specialty and Home Delivery Pharmacy Refill Coordination Note    Specialty Medication(s) to be Shipped:   Infectious Disease: Biktarvy and Transplant: tacrolimus 1mg , tacrolimus 5mg , Prednisone 5mg , and Azathioprine 50mg  and Azathioprine 100mg     Other medication(s) to be shipped: No additional medications requested for fill at this time     Christian Brown, DOB: Jun 28, 1980  Phone: [phone] (home)       All above HIPAA information was verified with patient.     Was a Nurse, learning disability used for this call? No    Completed refill call assessment today to schedule patient's medication shipment from the [facility] and Home Delivery Pharmacy  [phone]).  All relevant notes have been reviewed.     Specialty medication(s) and dose(s) confirmed: Regimen is correct and unchanged.   Changes to medications: [name] reports no changes at this time.  Changes to insurance: No  New side effects reported not previously addressed with a pharmacist or physician: None reported  Questions for the pharmacist: No    Confirmed patient received a Conservation officer, historic buildings and a Surveyor, mining with first shipment. The patient will receive a drug information handout for each medication shipped and additional FDA Medication Guides as required.       DISEASE/MEDICATION-SPECIFIC INFORMATION        N/A    SPECIALTY MEDICATION ADHERENCE     Medication Adherence    Patient reported X missed doses in the last month: 0  Specialty Medication: Biktarvy  Patient is on additional specialty medications: Yes  Additional Specialty Medications: Tacrolimus 1mg   Patient Reported Additional Medication X Missed Doses in the Last Month: 0  Patient is on more than two specialty medications: Yes  Specialty Medication: Tacrolimus 5mg   Patient Reported Additional Medication X Missed Doses in the Last Month: 0  Specialty Medication: Prednisone 5mg   Patient Reported Additional Medication X Missed Doses in the Last Month: 0  Specialty Medication: Azathioprine 100mg   Patient Reported Additional Medication X Missed Doses in the Last Month: 0  Specialty Medication: Azathioprine 50mg   Patient Reported Additional Medication X Missed Doses in the Last Month: 0              Were doses missed due to medication being on hold? No    Tacrolimus 1 mg: 0 days of medicine on hand   Tacrolimus 5 mg: 5 days of medicine on hand   Biktarvy 50-200-25 mg: 10 days of medicine on hand   Prednisone 5 mg: 0 days of medicine on hand   Azathioprine 50mg  : 2 days of medicine on hand.  Azathioprine 100mg : 2 days of medicine on hand.      REFERRAL TO PHARMACIST     Referral to the pharmacist: Not needed      [name]     Shipping address confirmed in Epic.       Delivery Scheduled: Yes, Expected medication delivery date: 11/29/22.     Medication will be delivered via UPS to the prescription address in Epic WAM.    Tera Helper, [facility]   [facility] Home Delivery Pharm[facility]  Specialty Pharmacist

## 2022-11-27 NOTE — Unmapped (Signed)
Pt reports BP 115-120/70-80 preior to taking his medications and as low as 90/54 after.  Hr 96-105.  Reviewed with [name], pt advised to decrease minoxidil to 2.5 mg bid and follow BPs.  If continues to be low, pt will reduce the coreg to 25 mg bid.  Will check in later this week.

## 2022-11-27 NOTE — Unmapped (Signed)
[facility] Specialty Medication Onboarding    Specialty Medication: atovaquone 750 mg/5 mL suspension [name])  Prior Authorization: Not Required   Financial Assistance: No - copay  <$25  Final Copay/Day Supply: $0 / 30    Insurance Restrictions: None     Notes to Pharmacist:   Credit Card on File: not applicable    The triage team has completed the benefits investigation and has determined that the patient is able to fill this medication at [facility]. Please contact the patient to complete the onboarding or follow up with the prescribing physician as needed.

## 2022-11-27 NOTE — Unmapped (Signed)
This onboarding is for the following medication:  1) Conservation officer, [facility] and [facility]    Patient Onboarding/Medication Counseling    Christian Brown is a 42 y.o. male with a kidney transplant who I am counseling today on initiation of therapy.  I am speaking to the patient.    Was a Nurse, learning disability used for this call? No    Verified patient's date of birth / HIPAA.    Specialty medication(s) to be sent: Transplant: Atovaquone suspension      Non-specialty medications/supplies to be sent: none      Medications not needed at this time: none           The patient declined counseling on missed dose instructions, goals of therapy, side effects and monitoring parameters, warnings and precautions, drug/food interactions, and storage, handling precautions, and disposal because they have taken the medication previously. The information in the declined sections below are for informational purposes only and was not discussed with patient.     Mepron (atovaquone)    Medication & Administration     Dosage: Take 10mg  by mouth once daily.    Administration:   Shake gently before use  Take with food  Measure liquid carefully using measuring device provided with drug    Adherence/Missed dose instructions:  Take a missed dose as soon as you think about it  If it is close to the time for next dose, skip missed dose and resume normal schedule  Do not take 2 doses at the same time or extra doses    Goals of Therapy     To prevent Pneumocystis jirovecii pneumonia (PJP)    Side Effects & Monitoring Parameters     Common side effects  Headache  Nausea, vomiting, diarrhea, abdominal pain  Skin rash  Trouble sleeping  Muscle pain or ache  Flu-like symptoms- Stuffy or runny nose, cough, fever    The following side effects should be reported to the provider:  Allergic reaction  Signs of infection  Depression  Thrush  Dark urine, fatigue, lack of appetite, abdominal pain, light colored stool, vomiting, yellow skin/eyes    Monitoring Parameters  Hepatic function tests     Contraindications, Warnings, & Precautions     Use caution in patients with severe hepatic impairment  Use caution in elderly patients    Drug/Food Interactions     Medication list reviewed in Epic. The patient was instructed to inform the care team before taking any new medications or supplements. No drug interactions identified.    Storage, Handling Precautions, & Disposal     Store at room temperature in dry location  Keep away from children and pets      Current Medications (including OTC/herbals), Comorbidities and Allergies     Current Outpatient Medications   Medication Sig Dispense Refill    acetaminophen (TYLENOL) 160 mg/5 mL solution Take 20.3 mL (650 mg total) by mouth every six (6) hours as needed for fever or pain. 473 mL 0    albuterol HFA 90 mcg/actuation inhaler Inhale 1-2 puffs every four (4) to six (6) hours as needed for wheezing or shortness of breath. 18 g 11    atovaquone (MEPRON) 750 mg/5 mL suspension Take 10 mL (1,500 mg total) by mouth daily. 300 mL 2    azathioprine (AZASAN) 100 mg tablet Take 1 tablet (100 mg total) by mouth daily. Take in addition to 50 mg tablet for a total daily dose of 150 mg daily  90 tablet 3    azathioprine (IMURAN) 50 mg tablet Take 1 tablet (50 mg total) by mouth daily. Take in addition to 100 mg tablet for a total daily dose to of 150 mg daily 30 tablet 11    bictegrav-emtricit-tenofov ala (BIKTARVY) 50-200-25 mg tablet Take 1 tablet by mouth every morning. 30 tablet 11    calamine-zinc oxide 8-8 % Lotn Apply topically four (4) times a day. 177 mL 0    carvedilol (COREG) 25 MG tablet Take 2 tablets (50 mg total) by mouth two (2) times a day. 360 tablet 3    cetirizine (ZYRTEC) 10 MG tablet Take 1 tablet (10 mg total) by mouth daily as needed (Itching). (Patient not taking: Reported on 10/27/2022) 30 tablet 11    diphenhydrAMINE (BENADRYL) 25 mg capsule Take 1 capsule (25 mg total) by mouth every six (6) hours as needed for itching. 96 capsule 0    docusate sodium (COLACE) 100 MG capsule Take 1 capsule (100 mg total) by mouth two (2) times a day as needed for constipation. (Patient not taking: Reported on 10/27/2022) 60 capsule 11    empty container Misc Use as directed with Zarxio 1 each 0    filgrastim-sndz (ZARXIO) 480 mcg/0.8 mL Syrg Inject 0.8 mL (480 mcg total) under the skin once a week. Administer only as directed by transplant team. 3.2 mL 1    hydrocortisone 1 % cream Apply topically two (2) times a day. (Patient not taking: Reported on 10/27/2022) 30 g 11    magnesium oxide-Mg AA chelate (MAGNESIUM, AMINO ACID CHELATE,) 133 mg Take 1 tablet by mouth two (2) times a day. Hold until directed to take by nurse coordinator (Patient not taking: Reported on 10/27/2022) 60 tablet 11    minoxidil (LONITEN) 2.5 MG tablet Take 2 tablets (5 mg total) by mouth two (2) times a day. 180 tablet 3    naloxone (NARCAN) 4 mg nasal spray SPRAY TO 1 NOSTRIL IN CASE OF OPIOID OVERDOSE, MAY REPEAT IN 2-3 MINUTES UNTIL EMS ARRIVES OR RESPONSIVE (Patient not taking: Reported on 10/27/2022)      pantoprazole (PROTONIX) 40 MG tablet Take 1 tablet (40 mg total) by mouth two (2) times a day. 180 tablet 3    predniSONE (DELTASONE) 5 MG tablet Take 1 tablet (5 mg total) by mouth in the morning. 90 tablet 3    prochlorperazine (COMPAZINE) 5 MG tablet Take 1 tablet (5 mg total) by mouth every eight (8) hours as needed for nausea. (Patient not taking: Reported on 10/27/2022) 30 tablet 0    sodium bicarbonate 650 mg tablet Take 1 tablet (650 mg total) by mouth two (2) times a day. (Patient not taking: Reported on 10/27/2022) 180 tablet 3    tacrolimus (PROGRAF) 1 MG capsule Take 1 capsule (1 mg total) by mouth two (2) times a day. Take in addition to 5 mg tablets for a total dose of 11 mg bid 60 capsule 11    tacrolimus (PROGRAF) 5 MG capsule Take 2 capsules (10 mg total) by mouth two (2) times a day. Take in addition to 1 mg tablets for a total dose of 11 mg bid 360 capsule 3    valGANciclovir (VALCYTE) 450 mg tablet Take 1 tablet (450 mg total) by mouth daily. (Patient not taking: Reported on 10/27/2022) 30 tablet 1     Current Facility-Administered Medications   Medication Dose Route Frequency Provider Last Rate Last Admin    cephalexin (KEFLEX) capsule 500 mg  500 mg Oral Once Lewie Chamber, MD        sodium chloride 0.9 % (NS) bag 1,000 mL  1,000 mL Intravenous Once Leafy Half, MD   Stopped at 10/06/22 1550       Allergies   Allergen Reactions    Amlodipine Hives, Palpitations, Rash and Other (See Comments)     Causes fatigue      Doxercalciferol Hives    Fish Containing Products Hives and Shortness Of Breath     Please do not serve fish for meals. Highly allergic         Iron Sucrose Hives    Labetalol Itching and Rash    Metoclopramide Hives and Other (See Comments)     Causes ticks/ jerks    Metoprolol Hives    Nifedipine Hives, Itching and Shortness Of Breath    Other Swelling     Suture material, localized swelling at closure site    Sulfamethoxazole-Trimethoprim Rash and Other (See Comments)     Renal failure      Vancomycin Hives and Other (See Comments)     Patient states vancomycin caused kidney injury    Calcitonin     Peanut Oil Other (See Comments)    Potassium Clavulanate     Azathioprine Itching and Rash    Clindamycin Other (See Comments) and Palpitations     Chest pain    Hydralazine Itching and Dizziness           Sensipar [Cinacalcet] Hives       Patient Active Problem List   Diagnosis    Asthma    CMV (cytomegalovirus infection) (CMS-HCC)    Essential hypertension    HIV infection (CMS-HCC)    OSA (obstructive sleep apnea)    Other specified disorders of parathyroid gland (CMS-HCC)    Multiple drug allergies    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)    Dialysis AV fistula malfunction (CMS-HCC)    Acute blood loss anemia    Melena    Abdominal pain    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    GERD (gastroesophageal reflux disease)    Chest pressure    Chronic deep vein thrombosis (DVT) of femoral vein of left lower extremity (CMS-HCC)    Chronic diarrhea    Dependence on renal dialysis (CMS-HCC)    Left ventricular hypertrophy    Mild intermittent asthma without complication    Mitral valve disorder    Neuropathy involving both lower extremities    Nonrheumatic mitral valve stenosis    Pulmonary hypertension (CMS-HCC)    Renovascular hypertension    Iron deficiency anemia    End stage renal failure on dialysis (CMS-HCC)    History of DVT (deep vein thrombosis)    Transplanted kidney    History of anemia due to CKD    Anemia due to stage 4 chronic kidney disease (CMS-HCC)    Other neutropenia (CMS-HCC)       Reviewed and up to date in Epic.    Appropriateness of Therapy     Acute infections noted within Epic:  No active infections  Patient reported infection: None    Is the medication and dose appropriate based on diagnosis, medication list, comorbidities, allergies, medical history, patient???s ability to self-administer the medication, and therapeutic goals? Yes    Prescription has been clinically reviewed: Yes      Baseline Quality of Life Assessment      How  many days over the past month did your kidney transplant  keep you from your normal activities? For example, brushing your teeth or getting up in the morning. 0    Financial Information     Medication Assistance provided: None Required    Anticipated copay of $0 reviewed with patient. Verified delivery address.    Delivery Information     Scheduled delivery date: 11/29/22    Expected start date: 11/29/22      Medication will be delivered via UPS to the prescription address in [facility].  This shipment will not require a signature.      Explained the services we provide at [facility] and that each month we would call to set up refills.  Stressed importance of returning phone calls so that we could ensure they receive their medications in time each month.  Informed patient that we should be setting up refills 7-10 days prior to when they will run out of medication.  A pharmacist will reach out to perform a clinical assessment periodically.  Informed patient that a welcome packet, containing information about our pharmacy and other support services, a Notice of Privacy Practices, and a drug information handout will be sent.      The patient or caregiver noted above participated in the development of this care plan and knows that they can request review of or adjustments to the care plan at any time.      Patient or caregiver verbalized understanding of the above information as well as how to contact the pharmacy at [phone] option 4 with any questions/concerns.  The pharmacy is open Monday through Friday 8:30am-4:30pm.  A pharmacist is available 24/7 via pager to answer any clinical questions they may have.    Patient Specific Needs     Does the patient have any physical, cognitive, or cultural barriers? No    Does the patient have adequate living arrangements? (i.e. the ability to store and take their medication appropriately) Yes    Did you identify any home environmental safety or security hazards? No    Patient prefers to have medications discussed with  Patient     Is the patient or caregiver able to read and understand education materials at a high school level or above? No    Patient's primary language is  English     Is the patient high risk? No    SOCIAL DETERMINANTS OF HEALTH     At the [facility], we have learned that life circumstances - like trouble affording food, housing, utilities, or transportation can affect the health of many of our patients.   That is why we wanted to ask: are you currently experiencing any life circumstances that are negatively impacting your health and/or quality of life? Patient declined to answer    Social Determinants of Health     Food Insecurity: No Food Insecurity (09/06/2022)    Hunger Vital Sign     Worried About Running Out of Food in the Last Year: Never true     Ran Out of Food in the Last Year: Never true   Internet Connectivity: Not on file   Housing/Utilities: Low Risk  (09/06/2022)    Housing/Utilities     Within the past 12 months, have you ever stayed: outside, in a car, in a tent, in an overnight shelter, or temporarily in someone else's home (i.e. couch-surfing)?: No     Are you worried about losing your housing?: No  Within the past 12 months, have you been unable to get utilities (heat, electricity) when it was really needed?: No   Tobacco Use: Low Risk  (11/02/2022)    Patient History     Smoking Tobacco Use: Never     Smokeless Tobacco Use: Never     Passive Exposure: Not on file   Transportation Needs: No Transportation Needs (09/06/2022)    PRAPARE - Transportation     Lack of Transportation (Medical): No     Lack of Transportation (Non-Medical): No   Alcohol Use: Not At Risk (09/05/2018)    Received from [facility], [facility]    AUDIT-C     Frequency of Alcohol Consumption: Never     Average Number of Drinks: Not on file     Frequency of Binge Drinking: Not on file   Interpersonal Safety: Unknown (11/27/2022)    Interpersonal Safety     Unsafe Where You Currently Live: Not on file     Physically Hurt by Anyone: Not on file     Abused by Anyone: Not on file   Physical Activity: Not on file   Intimate Partner Violence: Patient Declined (03/05/2022)    Received from [facility] [place], [facility] [place], Afraid, Rape, and Kick questionnaire     Fear of Current or Ex-Partner: Patient declined     Emotionally Abused: Patient declined     Physically Abused: Patient declined     Sexually Abused: Patient declined   Stress: Not on file   Substance Use: Not on file   Social Connections: Unknown (07/07/2021)    Received from [facility], [facility]    Social Network     Social Network: Not on file   Financial Resource Strain: Low Risk  (09/06/2022)    Overall Financial Resource Strain (CARDIA)     Difficulty of Paying Living Expenses: Not hard at all   Depression: Not at risk (11/08/2021)    Received from [employer], [facility]    PHQ-2     Depression Risk: 0   Health Literacy: Not on file       Would you be willing to receive help with any of the needs that you have identified today? Not applicable       Tera Helper, [facility]  [facility] Specialty Pharmacist

## 2022-11-28 ENCOUNTER — Ambulatory Visit: Admit: 2022-11-28 | Discharge: 2022-11-29 | Payer: MEDICARE

## 2022-11-28 LAB — METHYLMALONIC ACID, SERUM: METHYLMALONIC ACID: 0.18 nmol/mL

## 2022-11-28 LAB — RENAL FUNCTION PANEL
ALBUMIN: 4 g/dL — ABNORMAL LOW (ref 4.1–5.1)
BLOOD UREA NITROGEN: 18 mg/dL (ref 6–20)
BUN / CREAT RATIO: 10 (ref 9–20)
CALCIUM: 12 mg/dL — ABNORMAL HIGH (ref 8.7–10.2)
CHLORIDE: 108 mmol/L — ABNORMAL HIGH (ref 96–106)
CO2: 17 mmol/L — ABNORMAL LOW (ref 20–29)
CREATININE: 1.8 mg/dL — ABNORMAL HIGH (ref 0.76–1.27)
EGFR: 48 mL/min/{1.73_m2} — ABNORMAL LOW
GLUCOSE: 102 mg/dL — ABNORMAL HIGH (ref 70–99)
PHOSPHORUS, SERUM: 2.2 mg/dL — ABNORMAL LOW (ref 2.8–4.1)
POTASSIUM: 5.4 mmol/L — ABNORMAL HIGH (ref 3.5–5.2)
SODIUM: 136 mmol/L (ref 134–144)

## 2022-11-28 LAB — CBC W/ DIFFERENTIAL
BASOPHILS ABSOLUTE COUNT: 0.1 10*3/uL (ref 0.0–0.2)
BASOPHILS RELATIVE PERCENT: 3 %
EOSINOPHILS ABSOLUTE COUNT: 0.1 10*3/uL (ref 0.0–0.4)
EOSINOPHILS RELATIVE PERCENT: 2 %
HEMATOCRIT: 23.1 % — ABNORMAL LOW (ref 37.5–51.0)
HEMOGLOBIN: 7.2 g/dL — ABNORMAL LOW (ref 13.0–17.7)
LYMPHOCYTES ABSOLUTE COUNT: 0.6 10*3/uL — ABNORMAL LOW (ref 0.7–3.1)
LYMPHOCYTES RELATIVE PERCENT: 20 %
MEAN CORPUSCULAR HEMOGLOBIN CONC: 31.2 g/dL — ABNORMAL LOW (ref 31.5–35.7)
MEAN CORPUSCULAR HEMOGLOBIN: 31.2 pg (ref 26.6–33.0)
MEAN CORPUSCULAR VOLUME: 100 fL — ABNORMAL HIGH (ref 79–97)
MONOCYTES ABSOLUTE COUNT: 0.7 10*3/uL (ref 0.1–0.9)
MONOCYTES RELATIVE PERCENT: 25 %
NEUTROPHILS ABSOLUTE COUNT: 1.4 10*3/uL (ref 1.4–7.0)
NEUTROPHILS RELATIVE PERCENT: 50 %
NUCLEATED RED BLOOD CELLS: 4 % — ABNORMAL HIGH (ref 0–0)
PLATELET COUNT: 211 10*3/uL (ref 150–450)
RED BLOOD CELL COUNT: 2.31 x10E6/uL — CL (ref 4.14–5.80)
RED CELL DISTRIBUTION WIDTH: 21.5 % — ABNORMAL HIGH (ref 11.6–15.4)
WHITE BLOOD CELL COUNT: 2.8 10*3/uL — ABNORMAL LOW (ref 3.4–10.8)

## 2022-11-28 LAB — MAGNESIUM: MAGNESIUM: 1.4 mg/dL — ABNORMAL LOW (ref 1.6–2.3)

## 2022-11-28 LAB — TACROLIMUS LEVEL: TACROLIMUS BLOOD: 6 ng/mL (ref 2.0–20.0)

## 2022-11-28 MED FILL — BIKTARVY 50 MG-200 MG-25 MG TABLET: ORAL | 30 days supply | Qty: 30 | Fill #1

## 2022-11-28 NOTE — Unmapped (Signed)
Christian Brown , a patient of [place], Camelia Phenes*, was seen in conjunction with the resident, Dr. Trisha Mangle.  Please see her note for other details.   The patient is referred to me for recurrent or tertiary hyperparathyroidism.  He has a history of chronic kidney disease.  He underwent renal transplantation in July 2024.  He was found to have hypercalcemia shortly after that.    He has a history of a subtotal parathyroidectomy by Dr. Allena Katz in 2021.  At that time, a right inferior parathyroid was not located.  A portion of the left inferior parathyroid was left in situ and marked with a clip.    Ultrasound:    An ultrasound exam was performed in clinic to look for an abnormal parathyroid.  On the right, there is a large hypoechoic nodule that appears to be almost entirely within the right thyroid lobe.  It measures 1.9 x 1.6 x 2.2 cm.  This has the appearance of a large abnormal parathyroid.  On the left, there is a hyperechoic linear density that is consistent with a clip.  No clear parathyroid tissue could be seen on the left.  The thyroid is otherwise normal in appearance.    Impression: Recurrent secondary hyperparathyroidism.  The large right sided inferior gland is likely the primary driver.    Plan: I would plan to remove only the right sided gland at this time.  I would also recommend cryopreservation.    I offered to return the patient to the ENT service for this operation.  At this time the patient is undecided.  He does not wish to schedule any intervention at this time and will let us know if he wishes to return to discuss that.    I did urge him to get this problem taken care of as the hypercalcemia will damage his kidney allograft if left unattended to.

## 2022-11-29 LAB — CMV DNA, QUANTITATIVE, PCR: CMV QUANT: POSITIVE [IU]/mL

## 2022-11-29 NOTE — Unmapped (Signed)
REFERRING PHYSICIAN:  [place], Lilyan Punt, MD  [address]  Surgery  [id number] [address] [place],  [place] [address]    CONSULTING PHYSICIANS:  Patient Care Team:  Belva Bertin, [place] as PCP - General (Internal Medicine)  Josetta Huddle Sandria Bales, MD as Referring Physician  Norma Fredrickson Lilyan Punt, MD (General Surgery)  Lourdes Sledge, MD as Consulting Physician (Cardiology)  [name] Francine Graven, MD as Transplant Nephrologist (Nephrology)  Celene Squibb, RN as Transplant Coordinator  Chargualaf, Lorel Monaco, CPP as Pharmacist (Pharmacist)  Sherrilyn Rist Lorel Monaco, CPP as Pharmacist (Pharmacist)  Johny Chess, MD as Attending Provider (Surgical Oncology)  Hepper, Berline Chough, NP as Nurse Practitioner (Surgical Oncology)  Alan Mulder, RN as Registered Nurse (Oncology Navigator)    PRIMARY CARE PROVIDER:  Belva Bertin, RTL        History and Physical    Assessment/Plan:     Impression:  Elmin Wiederholt is a  42 y.o. 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 Patient has a history of subtotal parathyroidectomy in 2021 with resection of right and left superior and partial left inferior. On US imaging, a large right inferior parathyroid adenoma was seen. Given post-transplant hypercalcemia and visualized right inferior parathyroid adenoma, patient is appropriate for parathyroidectomy of right inferior gland.     Plan:    - Recommend parathyroidectomy. However, patient requested additional time to review his schedule.  - Will return to clinic to schedule surgery       History of Present Illness:     Chief Complaint:  Tertiary hyperparathyroidism    Jahlon Baines is a  42 y.o. 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 Patient has a known history of secondary hyperparathyroidism s/p subtotal parathyroidectomy on 05/2019 with Karren Burly, MD (Otolaryngology). In that case, right inferior gland was not visualized and portion of left inferior gland preserved and marked with clip. Following this resection, patient developed pronounced right parathyroid adenoma with persistently elevated PTH and high normal calcium ranging (9-10). Following DDKT, he developed hypercalcemia ranging >12, most recent PTH 1259.7.     Patient reports some fatigues since transplant. Otherwise denies fevers, chills, mood changes, nausea, vomiting, abdominal pain, diarrhea, constipation. Denies prior neck trauma or surgery.       Allergies    Allergies   Allergen Reactions    Amlodipine Hives, Palpitations, Rash and Other (See Comments)     Causes fatigue      Doxercalciferol Hives    Fish Containing Products Hives and Shortness Of Breath     Please do not serve fish for meals. Highly allergic         Iron Sucrose Hives    Labetalol Itching and Rash    Metoclopramide Hives and Other (See Comments)     Causes ticks/ jerks    Metoprolol Hives    Nifedipine Hives, Itching and Shortness Of Breath    Other Swelling     Suture material, localized swelling at closure site    Sulfamethoxazole-Trimethoprim Rash and Other (See Comments)     Renal failure      Vancomycin Hives and Other (See Comments)     Patient states vancomycin caused kidney injury    Calcitonin     Peanut Oil Other (See Comments)    Potassium Clavulanate     Azathioprine Itching and Rash  Clindamycin Other (See Comments) and Palpitations     Chest pain    Hydralazine Itching and Dizziness           Sensipar [Cinacalcet] Hives         Medications        Current Outpatient Medications:     acetaminophen (TYLENOL) 160 mg/5 mL solution, Take 20.3 mL (650 mg total) by mouth every six (6) hours as needed for fever or pain., Disp: 473 mL, Rfl: 0    albuterol HFA 90 mcg/actuation inhaler, Inhale 1-2 puffs every four (4) to six (6) hours as needed for wheezing or shortness of breath., Disp: 18 g, Rfl: 11    atovaquone (MEPRON) 750 mg/5 mL suspension, Take 10 mL (1,500 mg total) by mouth daily., Disp: 300 mL, Rfl: 2    azathioprine (AZASAN) 100 mg tablet, Take 1 tablet (100 mg total) by mouth daily. Take in addition to 50 mg tablet for a total daily dose of 150 mg daily, Disp: 90 tablet, Rfl: 3    azathioprine (IMURAN) 50 mg tablet, Take 1 tablet (50 mg total) by mouth daily. Take in addition to 100 mg tablet for a total daily dose to of 150 mg daily, Disp: 30 tablet, Rfl: 11    bictegrav-emtricit-tenofov ala (BIKTARVY) 50-200-25 mg tablet, Take 1 tablet by mouth every morning., Disp: 30 tablet, Rfl: 11    calamine-zinc oxide 8-8 % Lotn, Apply topically four (4) times a day., Disp: 177 mL, Rfl: 0    carvedilol (COREG) 25 MG tablet, Take 2 tablets (50 mg total) by mouth two (2) times a day., Disp: 360 tablet, Rfl: 3    cetirizine (ZYRTEC) 10 MG tablet, Take 1 tablet (10 mg total) by mouth daily as needed (Itching). (Patient not taking: Reported on 10/27/2022), Disp: 30 tablet, Rfl: 11    diphenhydrAMINE (BENADRYL) 25 mg capsule, Take 1 capsule (25 mg total) by mouth every six (6) hours as needed for itching., Disp: 96 capsule, Rfl: 0    docusate sodium (COLACE) 100 MG capsule, Take 1 capsule (100 mg total) by mouth two (2) times a day as needed for constipation. (Patient not taking: Reported on 10/27/2022), Disp: 60 capsule, Rfl: 11    empty container Misc, Use as directed with Zarxio, Disp: 1 each, Rfl: 0    filgrastim-sndz (ZARXIO) 480 mcg/0.8 mL Syrg, Inject 0.8 mL (480 mcg total) under the skin once a week. Administer only as directed by transplant team., Disp: 3.2 mL, Rfl: 1    hydrocortisone 1 % cream, Apply topically two (2) times a day. (Patient not taking: Reported on 10/27/2022), Disp: 30 g, Rfl: 11    magnesium oxide-Mg AA chelate (MAGNESIUM, AMINO ACID CHELATE,) 133 mg, Take 1 tablet by mouth two (2) times a day. Hold until directed to take by nurse coordinator (Patient not taking: Reported on 10/27/2022), Disp: 60 tablet, Rfl: 11    minoxidil (LONITEN) 2.5 MG tablet, Take 1 tablet (2.5 mg total) by mouth two (2) times a day. (Patient not taking: Reported on 11/28/2022), Disp: 180 tablet, Rfl: 3    naloxone (NARCAN) 4 mg nasal spray, SPRAY TO 1 NOSTRIL IN CASE OF OPIOID OVERDOSE, MAY REPEAT IN 2-3 MINUTES UNTIL EMS ARRIVES OR RESPONSIVE (Patient not taking: Reported on 10/27/2022), Disp: , Rfl:     pantoprazole (PROTONIX) 40 MG tablet, Take 1 tablet (40 mg total) by mouth two (2) times a day., Disp: 180 tablet, Rfl: 3    predniSONE (DELTASONE) 5 MG tablet, Take  1 tablet (5 mg total) by mouth in the morning., Disp: 90 tablet, Rfl: 3    prochlorperazine (COMPAZINE) 5 MG tablet, Take 1 tablet (5 mg total) by mouth every eight (8) hours as needed for nausea. (Patient not taking: Reported on 10/27/2022), Disp: 30 tablet, Rfl: 0    sodium bicarbonate 650 mg tablet, Take 1 tablet (650 mg total) by mouth two (2) times a day. (Patient not taking: Reported on 10/27/2022), Disp: 180 tablet, Rfl: 3    tacrolimus (PROGRAF) 1 MG capsule, Take 1 capsule (1 mg total) by mouth two (2) times a day. Take in addition to 5 mg tablets for a total dose of 11 mg twice daily., Disp: 180 capsule, Rfl: 3    tacrolimus (PROGRAF) 5 MG capsule, Take 2 capsules (10 mg total) by mouth two (2) times a day. Take in addition to 1 mg tablets for a total dose of 11 mg twice daily., Disp: 360 capsule, Rfl: 3    valGANciclovir (VALCYTE) 450 mg tablet, Take 1 tablet (450 mg total) by mouth daily. (Patient not taking: Reported on 10/27/2022), Disp: 30 tablet, Rfl: 1    Current Facility-Administered Medications:     cephalexin (KEFLEX) capsule 500 mg, 500 mg, Oral, Once, Lewie Chamber, MD    sodium chloride 0.9 % (NS) bag 1,000 mL, 1,000 mL, Intravenous, Once, Kotzen, Francine Graven, MD, Stopped at 10/06/22 1550      Past Medical History    Past Medical History:   Diagnosis Date    Acquired immunodeficiency syndrome (CMS-HCC) 11/14/2013 Formatting of this note might be different from the original.   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   He appears to be doing well. States he is back to 90% except for his leg swelling.    Will change his epivir to 150 mg daily when he completes his liquid rx. Will see him back in 1 month and repeat his labs th    Acute deep vein thrombosis (DVT) of popliteal vein of left lower extremity (CMS-HCC) 02/05/2019    Last Assessment & Plan:   Formatting of this note might be different from the original.  Moderate effusion left knee new the some limitation of motion.  No evidence of sepsis the white count the normal  will     Proceed on with an aspiration for cell count, culture and crystal analysis.  New x-rays ordered and currently pending will follow.  Further therapeutic recommendations contingent on the re    Anemia     Asthma     Candidal esophagitis (CMS-HCC) 07/30/2022    Community acquired pneumonia 07/30/2014    DVT (deep venous thrombosis) (CMS-HCC) 11/24/2013    Last Assessment & Plan:    Formatting of this note might be different from the original.   Appears to be doing better (swelling improved). He will f/u with PCP regarding coumadin dosing (INR is low).  Formatting of this note might be different from the original.   Last Assessment & Plan:    Formatting of this note might be different from the original.   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  greatly appreciate renal f/u.  Formatting of this note might be different from the original.   Last Assessment & Plan:    greatly appreciate renal f/u.   Last Assessment & Plan:    greatly appreciate renal f/u.  Formatting of this note might be different from the original.   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    Rectal bleeding 04/23/2019    Sleep apnea          Past Surgical History    Past Surgical History:   Procedure Laterality Date    INTUSSUSCEPTION REPAIR      PERICARDIAL WINDOW      PR AUTOTRANSPLANT, PARATHYROID Bilateral 06/17/2019    Procedure: PARATHYROID AUTOTRANSPLANTATION;  Surgeon: Karren Burly, MD;  Location: MAIN OR [facility];  Service: ENT    PR CREAT AV FISTULA,NON-AUTOGENOUS GRAFT Left 07/05/2022    Procedure: CREATE AV FISTULA (SEPARATE PROC); NONAUTOGENOUS GRAFT (EG, BIOLOGICAL COLLAGEN, THERMOPLASTIC GRAFT), UPPER EXTREMITY;  Surgeon: Toledo, Lilyan Punt, MD;  Location: MAIN OR [facility];  Service: Transplant    PR EXCISION TURBINATE,SUBMUCOUS Bilateral 10/22/2020    Procedure: SUBMUCOUS RESECTION INFERIOR TURBINATE, PARTIAL OR COMPLETE, ANY METHOD;  Surgeon: Maeola Sarah, MD;  Location: OR [facility];  Service: ENT    PR EXPLORE PARATHYROID GLANDS Bilateral 06/17/2019    Procedure: PARATHYROIDECTOMY OR EXPLORATION OF PARATHYROID(S);  Surgeon: Karren Burly, MD;  Location: MAIN OR [facility];  Service: ENT    PR REPAIR OF NASAL SEPTUM N/A 10/22/2020    Procedure: SEPTOPLASTY OR SUBMUCOUS RESECTION, WITH OR WITHOUT CARTILAGE SCORING, CONTOURING OR REPLACEMENT WITH GRAFT;  Surgeon: Maeola Sarah, MD;  Location: OR [facility];  Service: ENT    PR REVISE AV FISTULA,W/O THROMBECTOMY Left 06/06/2022    Procedure: REVISON, OPEN, ARTERIOVENOUS FISTULA; WITHOUT THROMBECTOMY, AUTOGENOUS OR NONAUTOGENOUS DIALYSIS GRAFT;  Surgeon: Toledo, Lilyan Punt, MD;  Location: MAIN OR [facility];  Service: Transplant    PR REVISE AV FISTULA,W/O THROMBECTOMY Left 07/05/2022    Procedure: REVISON, OPEN, ARTERIOVENOUS FISTULA; WITHOUT THROMBECTOMY, AUTOGENOUS OR NONAUTOGENOUS DIALYSIS GRAFT;  Surgeon: Toledo, Lilyan Punt, MD;  Location: MAIN OR [facility];  Service: Transplant    PR RIGHT HEART CATH O2 SATURATION & CARDIAC OUTPUT N/A 07/20/2020    Procedure: Right Heart Catheterization with PULMONARY ANGIOGRAM;  Surgeon: Marlaine Hind, MD;  Location: [facility] C[facility];  Service: Cardiology    PR TRANSPLANT,PREP RENAL GRAFT/ARTERIAL N/A 09/05/2022    Procedure: [facility] RECONSTRUCTION CADAVER/LIVING DONOR RENAL ALLOGRAFT PRIOR TO TRANSPLANT; ARTERIAL ANASTOMOSIS EAC;  Surgeon: Toledo, Lilyan Punt, MD;  Location: MAIN OR [facility];  Service: Transplant    PR TRANSPLANTATION OF KIDNEY N/A 09/05/2022    Procedure: RENAL ALLOTRANSPLANTATION, IMPLANTATION OF GRAFT; WITHOUT RECIPIENT NEPHRECTOMY;  Surgeon: Toledo, Lilyan Punt, MD;  Location: MAIN OR [facility];  Service: Transplant    PR UPPER GI ENDOSCOPY,BIOPSY N/A 07/31/2022    Procedure: UGI ENDOSCOPY; WITH BIOPSY, SINGLE OR MULTIPLE;  Surgeon: Janace Aris, MD;  [facility];  Service: Gastroenterology    PR UPPER GI ENDOSCOPY,BIOPSY N/A 09/12/2022  Procedure: UGI ENDOSCOPY; WITH BIOPSY, SINGLE OR MULTIPLE;  Surgeon: Zetta Bills, MD;  [facility]: [facility];  Service: Gastroenterology    UMBILICAL HERNIA REPAIR           Family History    Family History   Problem Relation Age of Onset    Hypertension Mother     Arthritis Mother     Hypertension Father        Social History:    Tobacco use:   Social History     Tobacco Use   Smoking Status Never   Smokeless Tobacco Never     Alcohol use:   Social History     Substance and Sexual Activity   Alcohol Use Not Currently     Drug use:   Social History     Substance and Sexual Activity   Drug Use Never         Objective      Vital Signs    Vitals:    11/28/22 1313   BP: 111/77   Pulse: 90   Resp: 18   Temp: 36.9 ??C (98.4 ??F)   SpO2: 100%       Physical Exam    General Appearance:  No acute distress, appears stated age. Hair and skin normal in texture and character for age.  Voice normal in character.   Eyes:  Conjuctiva and lids appear normal.   There is no proptosis   Neck: The neck is supple with no palpable masses or thyromegaly.    Pulmonary:    Normal respiratory effort.  Lungs clear to auscultation  bilaterally.   Cardiovascular:  Regular rate and rhythm, no murmur noted.     Extremities: Grossly normal neuromuscular function. No pedal edema.   Neurologic: Muscle strength within normal limits.  No visible tremor.    Psychiatric: Judgement and insight appropriate.  Oriented to person, place, and time. Normal affect.

## 2022-11-30 LAB — CBC W/ DIFFERENTIAL
BANDED NEUTROPHILS ABSOLUTE COUNT: 0 10*3/uL (ref 0.0–0.1)
BASOPHILS ABSOLUTE COUNT: 0 10*3/uL (ref 0.0–0.2)
BASOPHILS RELATIVE PERCENT: 1 %
EOSINOPHILS ABSOLUTE COUNT: 0 10*3/uL (ref 0.0–0.4)
EOSINOPHILS RELATIVE PERCENT: 2 %
HEMATOCRIT: 23.7 % — ABNORMAL LOW (ref 37.5–51.0)
HEMOGLOBIN: 7.7 g/dL — ABNORMAL LOW (ref 13.0–17.7)
IMMATURE GRANULOCYTES: 0 %
LYMPHOCYTES ABSOLUTE COUNT: 0.7 10*3/uL (ref 0.7–3.1)
LYMPHOCYTES RELATIVE PERCENT: 30 %
MEAN CORPUSCULAR HEMOGLOBIN CONC: 32.5 g/dL (ref 31.5–35.7)
MEAN CORPUSCULAR HEMOGLOBIN: 33.3 pg — ABNORMAL HIGH (ref 26.6–33.0)
MEAN CORPUSCULAR VOLUME: 103 fL — ABNORMAL HIGH (ref 79–97)
MONOCYTES ABSOLUTE COUNT: 0.3 10*3/uL (ref 0.1–0.9)
MONOCYTES RELATIVE PERCENT: 14 %
NEUTROPHILS ABSOLUTE COUNT: 1.2 10*3/uL — ABNORMAL LOW (ref 1.4–7.0)
NEUTROPHILS RELATIVE PERCENT: 53 %
NUCLEATED RED BLOOD CELLS: 2 % — ABNORMAL HIGH (ref 0–0)
PLATELET COUNT: 195 10*3/uL (ref 150–450)
RED BLOOD CELL COUNT: 2.31 x10E6/uL — CL (ref 4.14–5.80)
RED CELL DISTRIBUTION WIDTH: 21.6 % — ABNORMAL HIGH (ref 11.6–15.4)
WHITE BLOOD CELL COUNT: 2.2 10*3/uL — CL (ref 3.4–10.8)

## 2022-11-30 MED FILL — AZATHIOPRINE 100 MG TABLET: ORAL | 30 days supply | Qty: 30 | Fill #0

## 2022-12-01 LAB — RENAL FUNCTION PANEL
ALBUMIN: 3.9 g/dL — ABNORMAL LOW (ref 4.1–5.1)
BLOOD UREA NITROGEN: 19 mg/dL (ref 6–20)
BUN / CREAT RATIO: 10 (ref 9–20)
CALCIUM: 12.1 mg/dL — ABNORMAL HIGH (ref 8.7–10.2)
CHLORIDE: 110 mmol/L — ABNORMAL HIGH (ref 96–106)
CO2: 17 mmol/L — ABNORMAL LOW (ref 20–29)
CREATININE: 1.9 mg/dL — ABNORMAL HIGH (ref 0.76–1.27)
EGFR: 45 mL/min/{1.73_m2} — ABNORMAL LOW
GLUCOSE: 109 mg/dL — ABNORMAL HIGH (ref 70–99)
PHOSPHORUS, SERUM: 2.3 mg/dL — ABNORMAL LOW (ref 2.8–4.1)
POTASSIUM: 5.4 mmol/L — ABNORMAL HIGH (ref 3.5–5.2)
SODIUM: 137 mmol/L (ref 134–144)

## 2022-12-01 LAB — MAGNESIUM: MAGNESIUM: 1.4 mg/dL — ABNORMAL LOW (ref 1.6–2.3)

## 2022-12-02 LAB — CMV DNA, QUANTITATIVE, PCR: CMV QUANT: POSITIVE [IU]/mL

## 2022-12-02 LAB — TACROLIMUS LEVEL: TACROLIMUS BLOOD: 8.9 ng/mL (ref 2.0–20.0)

## 2022-12-04 DIAGNOSIS — Z940 Kidney transplant status: Principal | ICD-10-CM

## 2022-12-04 DIAGNOSIS — Z79899 Other long term (current) drug therapy: Principal | ICD-10-CM

## 2022-12-04 DIAGNOSIS — N184 Chronic kidney disease, stage 4 (severe): Principal | ICD-10-CM

## 2022-12-04 DIAGNOSIS — E119 Type 2 diabetes mellitus without complications: Principal | ICD-10-CM

## 2022-12-04 LAB — CBC W/ DIFFERENTIAL
BANDED NEUTROPHILS ABSOLUTE COUNT: 0 10*3/uL (ref 0.0–0.1)
BASOPHILS ABSOLUTE COUNT: 0 10*3/uL (ref 0.0–0.2)
BASOPHILS RELATIVE PERCENT: 1 %
EOSINOPHILS ABSOLUTE COUNT: 0 10*3/uL (ref 0.0–0.4)
EOSINOPHILS RELATIVE PERCENT: 1 %
HEMATOCRIT: 25.2 % — ABNORMAL LOW (ref 37.5–51.0)
HEMOGLOBIN: 8.3 g/dL — ABNORMAL LOW (ref 13.0–17.7)
IMMATURE GRANULOCYTES: 1 %
LYMPHOCYTES ABSOLUTE COUNT: 0.5 10*3/uL — ABNORMAL LOW (ref 0.7–3.1)
LYMPHOCYTES RELATIVE PERCENT: 23 %
MEAN CORPUSCULAR HEMOGLOBIN CONC: 32.9 g/dL (ref 31.5–35.7)
MEAN CORPUSCULAR HEMOGLOBIN: 34.3 pg — ABNORMAL HIGH (ref 26.6–33.0)
MEAN CORPUSCULAR VOLUME: 104 fL — ABNORMAL HIGH (ref 79–97)
MONOCYTES ABSOLUTE COUNT: 0.3 10*3/uL (ref 0.1–0.9)
MONOCYTES RELATIVE PERCENT: 11 %
NEUTROPHILS ABSOLUTE COUNT: 1.4 10*3/uL (ref 1.4–7.0)
NEUTROPHILS RELATIVE PERCENT: 63 %
NUCLEATED RED BLOOD CELLS: 1 % — ABNORMAL HIGH (ref 0–0)
PLATELET COUNT: 178 10*3/uL (ref 150–450)
RED BLOOD CELL COUNT: 2.42 x10E6/uL — CL (ref 4.14–5.80)
RED CELL DISTRIBUTION WIDTH: 21.5 % — ABNORMAL HIGH (ref 11.6–15.4)
WHITE BLOOD CELL COUNT: 2.2 10*3/uL — CL (ref 3.4–10.8)

## 2022-12-05 LAB — RENAL FUNCTION PANEL
ALBUMIN: 4 g/dL — ABNORMAL LOW (ref 4.1–5.1)
BLOOD UREA NITROGEN: 15 mg/dL (ref 6–20)
BUN / CREAT RATIO: 9 (ref 9–20)
CALCIUM: 11.9 mg/dL — ABNORMAL HIGH (ref 8.7–10.2)
CHLORIDE: 110 mmol/L — ABNORMAL HIGH (ref 96–106)
CO2: 18 mmol/L — ABNORMAL LOW (ref 20–29)
CREATININE: 1.68 mg/dL — ABNORMAL HIGH (ref 0.76–1.27)
EGFR: 53 mL/min/{1.73_m2} — ABNORMAL LOW
GLUCOSE: 96 mg/dL (ref 70–99)
PHOSPHORUS, SERUM: 2.1 mg/dL — ABNORMAL LOW (ref 2.8–4.1)
POTASSIUM: 5.1 mmol/L (ref 3.5–5.2)
SODIUM: 137 mmol/L (ref 134–144)

## 2022-12-05 LAB — TACROLIMUS LEVEL: TACROLIMUS BLOOD: 6.9 ng/mL (ref 2.0–20.0)

## 2022-12-05 LAB — MAGNESIUM: MAGNESIUM: 1.4 mg/dL — ABNORMAL LOW (ref 1.6–2.3)

## 2022-12-06 LAB — CMV DNA, QUANTITATIVE, PCR: CMV QUANT: POSITIVE [IU]/mL

## 2022-12-06 NOTE — Unmapped (Addendum)
[facility] Clinical Assessment & Refill Coordination Note    Christian Brown, DOB: 09-30-1980  Phone: [phone] (home)     All above HIPAA information was verified with patient.     Was a Nurse, learning disability used for this call? No    Specialty Medication(s):   Transplant: Zarxio     Current Outpatient Medications   Medication Sig Dispense Refill    acetaminophen (TYLENOL) 160 mg/5 mL solution Take 20.3 mL (650 mg total) by mouth every six (6) hours as needed for fever or pain. 473 mL 0    albuterol HFA 90 mcg/actuation inhaler Inhale 1-2 puffs every four (4) to six (6) hours as needed for wheezing or shortness of breath. 18 g 11    atovaquone (MEPRON) 750 mg/5 mL suspension Take 10 mL (1,500 mg total) by mouth daily. 300 mL 2    azathioprine (AZASAN) 100 mg tablet Take 1 tablet (100 mg total) by mouth daily. Take in addition to 50 mg tablet for a total daily dose of 150 mg daily 90 tablet 3    azathioprine (IMURAN) 50 mg tablet Take 1 tablet (50 mg total) by mouth daily. Take in addition to 100 mg tablet for a total daily dose to of 150 mg daily 30 tablet 11    bictegrav-emtricit-tenofov ala (BIKTARVY) 50-200-25 mg tablet Take 1 tablet by mouth every morning. 30 tablet 11    calamine-zinc oxide 8-8 % Lotn Apply topically four (4) times a day. 177 mL 0    carvedilol (COREG) 25 MG tablet Take 2 tablets (50 mg total) by mouth two (2) times a day. 360 tablet 3    cetirizine (ZYRTEC) 10 MG tablet Take 1 tablet (10 mg total) by mouth daily as needed (Itching). (Patient not taking: Reported on 10/27/2022) 30 tablet 11    diphenhydrAMINE (BENADRYL) 25 mg capsule Take 1 capsule (25 mg total) by mouth every six (6) hours as needed for itching. 96 capsule 0    docusate sodium (COLACE) 100 MG capsule Take 1 capsule (100 mg total) by mouth two (2) times a day as needed for constipation. (Patient not taking: Reported on 10/27/2022) 60 capsule 11    empty container Misc Use as directed with Zarxio 1 each 0    filgrastim-sndz (ZARXIO) 480 mcg/0.8 mL Syrg Inject 0.8 mL (480 mcg total) under the skin once a week. Administer only as directed by transplant team. 3.2 mL 1    hydrocortisone 1 % cream Apply topically two (2) times a day. (Patient not taking: Reported on 10/27/2022) 30 g 11    magnesium oxide-Mg AA chelate (MAGNESIUM, AMINO ACID CHELATE,) 133 mg Take 1 tablet by mouth two (2) times a day. Hold until directed to take by nurse coordinator (Patient not taking: Reported on 10/27/2022) 60 tablet 11    minoxidil (LONITEN) 2.5 MG tablet Take 1 tablet (2.5 mg total) by mouth two (2) times a day. (Patient not taking: Reported on 11/28/2022) 180 tablet 3    naloxone (NARCAN) 4 mg nasal spray SPRAY TO 1 NOSTRIL IN CASE OF OPIOID OVERDOSE, MAY REPEAT IN 2-3 MINUTES UNTIL EMS ARRIVES OR RESPONSIVE (Patient not taking: Reported on 10/27/2022)      pantoprazole (PROTONIX) 40 MG tablet Take 1 tablet (40 mg total) by mouth two (2) times a day. 180 tablet 3    predniSONE (DELTASONE) 5 MG tablet Take 1 tablet (5 mg total) by mouth in the morning. 90 tablet 3    prochlorperazine (COMPAZINE)  5 MG tablet Take 1 tablet (5 mg total) by mouth every eight (8) hours as needed for nausea. (Patient not taking: Reported on 10/27/2022) 30 tablet 0    sodium bicarbonate 650 mg tablet Take 1 tablet (650 mg total) by mouth two (2) times a day. (Patient not taking: Reported on 10/27/2022) 180 tablet 3    tacrolimus (PROGRAF) 1 MG capsule Take 1 capsule (1 mg total) by mouth two (2) times a day. Take in addition to 5 mg tablets for a total dose of 11 mg twice daily. 180 capsule 3    tacrolimus (PROGRAF) 5 MG capsule Take 2 capsules (10 mg total) by mouth two (2) times a day. Take in addition to 1 mg tablets for a total dose of 11 mg twice daily. 360 capsule 3    valGANciclovir (VALCYTE) 450 mg tablet Take 1 tablet (450 mg total) by mouth daily. (Patient not taking: Reported on 10/27/2022) 30 tablet 1     Current Facility-Administered Medications Medication Dose Route Frequency Provider Last Rate Last Admin    cephalexin (KEFLEX) capsule 500 mg  500 mg Oral Once Lewie Chamber, MD        sodium chloride 0.9 % (NS) bag 1,000 mL  1,000 mL Intravenous Once Leafy Half, MD   Stopped at 10/06/22 1550        Changes to medications: Herbie reports no changes at this time.    Allergies   Allergen Reactions    Amlodipine Hives, Palpitations, Rash and Other (See Comments)     Causes fatigue      Doxercalciferol Hives    Fish Containing Products Hives and Shortness Of Breath     Please do not serve fish for meals. Highly allergic         Iron Sucrose Hives    Labetalol Itching and Rash    Metoclopramide Hives and Other (See Comments)     Causes ticks/ jerks    Metoprolol Hives    Nifedipine Hives, Itching and Shortness Of Breath    Other Swelling     Suture material, localized swelling at closure site    Sulfamethoxazole-Trimethoprim Rash and Other (See Comments)     Renal failure      Vancomycin Hives and Other (See Comments)     Patient states vancomycin caused kidney injury    Calcitonin     Peanut Oil Other (See Comments)    Potassium Clavulanate     Azathioprine Itching and Rash    Clindamycin Other (See Comments) and Palpitations     Chest pain    Hydralazine Itching and Dizziness           Sensipar [Cinacalcet] Hives       Changes to allergies: No    SPECIALTY MEDICATION ADHERENCE     Zarxio 480  mcg/0.52ml : 4 doses of medicine on hand     Medication Adherence    Patient reported X missed doses in the last month: 0  Specialty Medication: Zarxio  Patient is on additional specialty medications: No          Specialty medication(s) dose(s) confirmed: Regimen is correct and unchanged.     Are there any concerns with adherence? No    Adherence counseling provided? Not needed    CLINICAL MANAGEMENT AND INTERVENTION      Clinical Benefit Assessment:    Do you feel the medicine is effective or helping your condition? Yes    Clinical Benefit counseling provided? Not  needed    Adverse Effects Assessment:    Are you experiencing any side effects? No    Are you experiencing difficulty administering your medicine? No    Quality of Life Assessment:    Quality of Life    Rheumatology  Oncology  Dermatology  Cystic Fibrosis          How many days over the past month did your kidney transplant  keep you from your normal activities? For example, brushing your teeth or getting up in the morning. 0    Have you discussed this with your provider? Not needed    Acute Infection Status:    Acute infections noted within Epic:  No active infections  Patient reported infection: None    Therapy Appropriateness:    Is therapy appropriate based on current medication list, adverse reactions, adherence, clinical benefit and progress toward achieving therapeutic goals? Yes, therapy is appropriate and should be continued     DISEASE/MEDICATION-SPECIFIC INFORMATION      For patients on injectable medications: Patient currently has 4 doses left.  Next injection is scheduled for to be determined by transplant team.    Solid Organ Transplant: Not Applicable    PATIENT SPECIFIC NEEDS     Does the patient have any physical, cognitive, or cultural barriers? No    Is the patient high risk? Yes, patient is taking a REMS drug. Medication is dispensed in compliance with REMS program    Did the patient require a clinical intervention? No    Does the patient require physician intervention or other additional services (i.e., nutrition, smoking cessation, social work)? No    SOCIAL DETERMINANTS OF HEALTH     At the [facility], we have learned that life circumstances - like trouble affording food, housing, utilities, or transportation can affect the health of many of our patients.   That is why we wanted to ask: are you currently experiencing any life circumstances that are negatively impacting your health and/or quality of life? Patient declined to answer    Social Determinants of Health Food Insecurity: No Food Insecurity (09/06/2022)    Hunger Vital Sign     Worried About Running Out of Food in the Last Year: Never true     Ran Out of Food in the Last Year: Never true   Internet Connectivity: Not on file   Housing/Utilities: Low Risk  (09/06/2022)    Housing/Utilities     Within the past 12 months, have you ever stayed: outside, in a car, in a tent, in an overnight shelter, or temporarily in someone else's home (i.e. couch-surfing)?: No     Are you worried about losing your housing?: No     Within the past 12 months, have you been unable to get utilities (heat, electricity) when it was really needed?: No   Tobacco Use: Low Risk  (11/28/2022)    Patient History     Smoking Tobacco Use: Never     Smokeless Tobacco Use: Never     Passive Exposure: Not on file   Transportation Needs: No Transportation Needs (09/06/2022)    PRAPARE - Transportation     Lack of Transportation (Medical): No     Lack of Transportation (Non-Medical): No   Alcohol Use: Not At Risk (09/05/2018)    [facility]    AUDIT-C     Frequency of Alcohol Consumption: Never     Average Number of Drinks: Not on file  Frequency of Binge Drinking: Not on file   Interpersonal Safety: Unknown (12/07/2022)    Interpersonal Safety     Unsafe Where You Currently Live: Not on file     Physically Hurt by Anyone: Not on file     Abused by Anyone: Not on file   Physical Activity: Not on file   Intimate Partner Violence: Patient Declined (03/05/2022)    Received from [facility] [place], [facility] [place], Afraid, Rape, and Kick questionnaire     Fear of Current or Ex-Partner: Patient declined     Emotionally Abused: Patient declined     Physically Abused: Patient declined     Sexually Abused: Patient declined   Stress: Not on file   Substance Use: Not on file   Social Connections: Unknown (07/07/2021)    Received from [facility], [facility]     Social Network: Not on file   Financial Resource Strain: Low Risk  (09/06/2022)    Overall Financial Resource Strain (CARDIA)     Difficulty of Paying Living Expenses: Not hard at all   Depression: Not at risk (11/08/2021)    Received from [employer], [facility]    PHQ-2     Depression Risk: 0   Health Literacy: Not on file       Would you be willing to receive help with any of the needs that you have identified today? Not applicable       SHIPPING     Specialty Medication(s) to be Shipped:   Transplant: Patient declined Zarxio and Valganciclovir today.    Other medication(s) to be shipped: No additional medications requested for fill at this time     Changes to insurance: No    Delivery Scheduled: Patient declined refill at this time due to has more than 2 weeks on hand..     Medication will be delivered via UPS to the confirmed prescription address in [facility].    The patient will receive a drug information handout for each medication shipped and additional FDA Medication Guides as required.  Verified that patient has previously received a Conservation officer, historic buildings and a Surveyor, mining.    The patient or caregiver noted above participated in the development of this care plan and knows that they can request review of or adjustments to the care plan at any time.      All of the patient's questions and concerns have been addressed.    Tera Helper, [facility]   [facility] Specialty Pharmacist

## 2022-12-07 LAB — CBC W/ DIFFERENTIAL
BANDED NEUTROPHILS ABSOLUTE COUNT: 0 10*3/uL (ref 0.0–0.1)
BASOPHILS ABSOLUTE COUNT: 0 10*3/uL (ref 0.0–0.2)
BASOPHILS RELATIVE PERCENT: 1 %
EOSINOPHILS ABSOLUTE COUNT: 0 10*3/uL (ref 0.0–0.4)
EOSINOPHILS RELATIVE PERCENT: 1 %
HEMATOCRIT: 26 % — ABNORMAL LOW (ref 37.5–51.0)
HEMOGLOBIN: 8.5 g/dL — ABNORMAL LOW (ref 13.0–17.7)
IMMATURE GRANULOCYTES: 1 %
LYMPHOCYTES ABSOLUTE COUNT: 0.5 10*3/uL — ABNORMAL LOW (ref 0.7–3.1)
LYMPHOCYTES RELATIVE PERCENT: 26 %
MEAN CORPUSCULAR HEMOGLOBIN CONC: 32.7 g/dL (ref 31.5–35.7)
MEAN CORPUSCULAR HEMOGLOBIN: 35.7 pg — ABNORMAL HIGH (ref 26.6–33.0)
MEAN CORPUSCULAR VOLUME: 109 fL — ABNORMAL HIGH (ref 79–97)
MONOCYTES ABSOLUTE COUNT: 0.3 10*3/uL (ref 0.1–0.9)
MONOCYTES RELATIVE PERCENT: 14 %
NEUTROPHILS ABSOLUTE COUNT: 1.2 10*3/uL — ABNORMAL LOW (ref 1.4–7.0)
NEUTROPHILS RELATIVE PERCENT: 57 %
NUCLEATED RED BLOOD CELLS: 2 % — ABNORMAL HIGH (ref 0–0)
PLATELET COUNT: 169 10*3/uL (ref 150–450)
RED BLOOD CELL COUNT: 2.38 x10E6/uL — CL (ref 4.14–5.80)
RED CELL DISTRIBUTION WIDTH: 22 % — ABNORMAL HIGH (ref 11.6–15.4)
WHITE BLOOD CELL COUNT: 2.1 10*3/uL — CL (ref 3.4–10.8)

## 2022-12-08 LAB — RENAL FUNCTION PANEL
ALBUMIN: 4.1 g/dL (ref 4.1–5.1)
BLOOD UREA NITROGEN: 15 mg/dL (ref 6–20)
BUN / CREAT RATIO: 9 (ref 9–20)
CALCIUM: 12.1 mg/dL — ABNORMAL HIGH (ref 8.7–10.2)
CHLORIDE: 110 mmol/L — ABNORMAL HIGH (ref 96–106)
CO2: 18 mmol/L — ABNORMAL LOW (ref 20–29)
CREATININE: 1.73 mg/dL — ABNORMAL HIGH (ref 0.76–1.27)
EGFR: 51 mL/min/{1.73_m2} — ABNORMAL LOW
GLUCOSE: 105 mg/dL — ABNORMAL HIGH (ref 70–99)
PHOSPHORUS, SERUM: 2.1 mg/dL — ABNORMAL LOW (ref 2.8–4.1)
POTASSIUM: 5.3 mmol/L — ABNORMAL HIGH (ref 3.5–5.2)
SODIUM: 138 mmol/L (ref 134–144)

## 2022-12-08 LAB — MAGNESIUM: MAGNESIUM: 1.4 mg/dL — ABNORMAL LOW (ref 1.6–2.3)

## 2022-12-08 NOTE — Unmapped (Signed)
Sent text to reschedule with Dr. Michiel Cowboy

## 2022-12-09 LAB — TACROLIMUS LEVEL: TACROLIMUS BLOOD: 6.3 ng/mL (ref 2.0–20.0)

## 2022-12-09 LAB — CMV DNA, QUANTITATIVE, PCR: CMV QUANT: POSITIVE [IU]/mL

## 2022-12-11 DIAGNOSIS — Z940 Kidney transplant status: Principal | ICD-10-CM

## 2022-12-11 LAB — CBC W/ DIFFERENTIAL
BANDED NEUTROPHILS ABSOLUTE COUNT: 0 10*3/uL (ref 0.0–0.1)
BASOPHILS ABSOLUTE COUNT: 0 10*3/uL (ref 0.0–0.2)
BASOPHILS RELATIVE PERCENT: 1 %
EOSINOPHILS ABSOLUTE COUNT: 0 10*3/uL (ref 0.0–0.4)
EOSINOPHILS RELATIVE PERCENT: 1 %
HEMATOCRIT: 27.4 % — ABNORMAL LOW (ref 37.5–51.0)
HEMOGLOBIN: 9 g/dL — ABNORMAL LOW (ref 13.0–17.7)
IMMATURE GRANULOCYTES: 1 %
LYMPHOCYTES ABSOLUTE COUNT: 0.6 10*3/uL — ABNORMAL LOW (ref 0.7–3.1)
LYMPHOCYTES RELATIVE PERCENT: 32 %
MEAN CORPUSCULAR HEMOGLOBIN CONC: 32.8 g/dL (ref 31.5–35.7)
MEAN CORPUSCULAR HEMOGLOBIN: 36 pg — ABNORMAL HIGH (ref 26.6–33.0)
MEAN CORPUSCULAR VOLUME: 110 fL — ABNORMAL HIGH (ref 79–97)
MONOCYTES ABSOLUTE COUNT: 0.3 10*3/uL (ref 0.1–0.9)
MONOCYTES RELATIVE PERCENT: 16 %
NEUTROPHILS ABSOLUTE COUNT: 0.9 10*3/uL — ABNORMAL LOW (ref 1.4–7.0)
NEUTROPHILS RELATIVE PERCENT: 49 %
NUCLEATED RED BLOOD CELLS: 1 % — ABNORMAL HIGH (ref 0–0)
PLATELET COUNT: 156 10*3/uL (ref 150–450)
RED BLOOD CELL COUNT: 2.5 x10E6/uL — CL (ref 4.14–5.80)
RED CELL DISTRIBUTION WIDTH: 21.2 % — ABNORMAL HIGH (ref 11.6–15.4)
WHITE BLOOD CELL COUNT: 1.7 10*3/uL — CL (ref 3.4–10.8)

## 2022-12-12 LAB — RENAL FUNCTION PANEL
ALBUMIN: 3.9 g/dL — ABNORMAL LOW (ref 4.1–5.1)
BLOOD UREA NITROGEN: 17 mg/dL (ref 6–20)
BUN / CREAT RATIO: 9 (ref 9–20)
CALCIUM: 11.7 mg/dL — ABNORMAL HIGH (ref 8.7–10.2)
CHLORIDE: 112 mmol/L — ABNORMAL HIGH (ref 96–106)
CO2: 18 mmol/L — ABNORMAL LOW (ref 20–29)
CREATININE: 1.82 mg/dL — ABNORMAL HIGH (ref 0.76–1.27)
EGFR: 48 mL/min/{1.73_m2} — ABNORMAL LOW
GLUCOSE: 110 mg/dL — ABNORMAL HIGH (ref 70–99)
PHOSPHORUS, SERUM: 2.5 mg/dL — ABNORMAL LOW (ref 2.8–4.1)
POTASSIUM: 4.8 mmol/L (ref 3.5–5.2)
SODIUM: 139 mmol/L (ref 134–144)

## 2022-12-12 LAB — MAGNESIUM: MAGNESIUM: 1.5 mg/dL — ABNORMAL LOW (ref 1.6–2.3)

## 2022-12-12 LAB — TACROLIMUS LEVEL: TACROLIMUS BLOOD: 7.5 ng/mL (ref 2.0–20.0)

## 2022-12-13 LAB — CMV DNA, QUANTITATIVE, PCR: CMV QUANT: POSITIVE [IU]/mL

## 2022-12-14 ENCOUNTER — Encounter: Admit: 2022-12-14 | Discharge: 2022-12-14 | Payer: MEDICARE

## 2022-12-14 ENCOUNTER — Institutional Professional Consult (permissible substitution): Admit: 2022-12-14 | Discharge: 2022-12-14 | Payer: MEDICARE

## 2022-12-14 ENCOUNTER — Ambulatory Visit: Admit: 2022-12-14 | Discharge: 2022-12-14 | Payer: MEDICARE

## 2022-12-14 DIAGNOSIS — N184 Chronic kidney disease, stage 4 (severe): Principal | ICD-10-CM

## 2022-12-14 DIAGNOSIS — I158 Other secondary hypertension: Principal | ICD-10-CM

## 2022-12-14 DIAGNOSIS — D708 Other neutropenia: Principal | ICD-10-CM

## 2022-12-14 DIAGNOSIS — E119 Type 2 diabetes mellitus without complications: Principal | ICD-10-CM

## 2022-12-14 DIAGNOSIS — D631 Anemia in chronic kidney disease: Principal | ICD-10-CM

## 2022-12-14 DIAGNOSIS — N189 Chronic kidney disease, unspecified: Principal | ICD-10-CM

## 2022-12-14 DIAGNOSIS — E213 Hyperparathyroidism, unspecified: Principal | ICD-10-CM

## 2022-12-14 DIAGNOSIS — Z79899 Other long term (current) drug therapy: Principal | ICD-10-CM

## 2022-12-14 DIAGNOSIS — B9735 Human immunodeficiency virus, type 2 [HIV 2] as the cause of diseases classified elsewhere: Principal | ICD-10-CM

## 2022-12-14 DIAGNOSIS — Z949 Transplanted organ and tissue status, unspecified: Principal | ICD-10-CM

## 2022-12-14 DIAGNOSIS — Z940 Kidney transplant status: Principal | ICD-10-CM

## 2022-12-14 DIAGNOSIS — I151 Hypertension secondary to other renal disorders: Principal | ICD-10-CM

## 2022-12-14 LAB — HEPATIC FUNCTION PANEL
ALBUMIN: 3.7 g/dL (ref 3.4–5.0)
ALKALINE PHOSPHATASE: 320 U/L — ABNORMAL HIGH (ref 46–116)
ALT (SGPT): <7 U/L — ABNORMAL LOW (ref 10–49)
AST (SGOT): 14 U/L (ref <=34)
BILIRUBIN DIRECT: 0.6 mg/dL — ABNORMAL HIGH (ref 0.00–0.30)
BILIRUBIN TOTAL: 1.8 mg/dL — ABNORMAL HIGH (ref 0.3–1.2)
PROTEIN TOTAL: 7.5 g/dL (ref 5.7–8.2)

## 2022-12-14 LAB — CBC W/ AUTO DIFF
BASOPHILS ABSOLUTE COUNT: 0 10*9/L (ref 0.0–0.1)
BASOPHILS RELATIVE PERCENT: 1 %
EOSINOPHILS ABSOLUTE COUNT: 0 10*9/L (ref 0.0–0.5)
EOSINOPHILS RELATIVE PERCENT: 1.4 %
HEMATOCRIT: 29.7 % — ABNORMAL LOW (ref 39.0–48.0)
HEMOGLOBIN: 10 g/dL — ABNORMAL LOW (ref 12.9–16.5)
LYMPHOCYTES ABSOLUTE COUNT: 0.5 10*9/L — ABNORMAL LOW (ref 1.1–3.6)
LYMPHOCYTES RELATIVE PERCENT: 28.2 %
MEAN CORPUSCULAR HEMOGLOBIN CONC: 33.7 g/dL (ref 32.0–36.0)
MEAN CORPUSCULAR HEMOGLOBIN: 37.2 pg — ABNORMAL HIGH (ref 25.9–32.4)
MEAN CORPUSCULAR VOLUME: 110.3 fL — ABNORMAL HIGH (ref 77.6–95.7)
MEAN PLATELET VOLUME: 8.4 fL (ref 6.8–10.7)
MONOCYTES ABSOLUTE COUNT: 0.2 10*9/L — ABNORMAL LOW (ref 0.3–0.8)
MONOCYTES RELATIVE PERCENT: 12.4 %
NEUTROPHILS ABSOLUTE COUNT: 1 10*9/L — ABNORMAL LOW (ref 1.8–7.8)
NEUTROPHILS RELATIVE PERCENT: 57 %
PLATELET COUNT: 204 10*9/L (ref 150–450)
RED BLOOD CELL COUNT: 2.69 10*12/L — ABNORMAL LOW (ref 4.26–5.60)
RED CELL DISTRIBUTION WIDTH: 24.2 % — ABNORMAL HIGH (ref 12.2–15.2)
WBC ADJUSTED: 1.7 10*9/L — ABNORMAL LOW (ref 3.6–11.2)

## 2022-12-14 LAB — BASIC METABOLIC PANEL
ANION GAP: 5 mmol/L (ref 5–14)
BLOOD UREA NITROGEN: 13 mg/dL (ref 9–23)
BUN / CREAT RATIO: 7
CALCIUM: 11.9 mg/dL — ABNORMAL HIGH (ref 8.7–10.4)
CHLORIDE: 113 mmol/L — ABNORMAL HIGH (ref 98–107)
CO2: 21.6 mmol/L (ref 20.0–31.0)
CREATININE: 1.83 mg/dL — ABNORMAL HIGH
EGFR CKD-EPI (2021) MALE: 48 mL/min/{1.73_m2} — ABNORMAL LOW (ref >=60)
GLUCOSE RANDOM: 102 mg/dL (ref 70–179)
POTASSIUM: 4.4 mmol/L (ref 3.4–4.8)
SODIUM: 140 mmol/L (ref 135–145)

## 2022-12-14 LAB — LIPID PANEL
CHOLESTEROL/HDL RATIO SCREEN: 3.5 (ref 1.0–4.5)
CHOLESTEROL: 115 mg/dL (ref <=200)
HDL CHOLESTEROL: 33 mg/dL — ABNORMAL LOW (ref 40–60)
LDL CHOLESTEROL CALCULATED: 52 mg/dL (ref 40–99)
NON-HDL CHOLESTEROL: 82 mg/dL (ref 70–130)
TRIGLYCERIDES: 149 mg/dL (ref 0–150)
VLDL CHOLESTEROL CAL: 29.8 mg/dL (ref 10–50)

## 2022-12-14 LAB — FERRITIN: FERRITIN: 204.8 ng/mL

## 2022-12-14 LAB — IRON PANEL
IRON SATURATION: 38 % (ref 20–55)
IRON: 106 ug/dL
TOTAL IRON BINDING CAPACITY: 279 ug/dL (ref 250–425)

## 2022-12-14 LAB — URINALYSIS WITH MICROSCOPY
BACTERIA: NONE SEEN /HPF
BILIRUBIN UA: NEGATIVE
BLOOD UA: NEGATIVE
GLUCOSE UA: NEGATIVE
KETONES UA: NEGATIVE
LEUKOCYTE ESTERASE UA: NEGATIVE
NITRITE UA: NEGATIVE
PH UA: 6 (ref 5.0–9.0)
RBC UA: 3 /HPF (ref <=3)
SPECIFIC GRAVITY UA: 1.02 (ref 1.003–1.030)
SQUAMOUS EPITHELIAL: <1 /HPF (ref 0–5)
UROBILINOGEN UA: <2
WBC UA: 3 /HPF — ABNORMAL HIGH (ref <=2)

## 2022-12-14 LAB — PARATHYROID HORMONE (PTH): PARATHYROID HORMONE INTACT: 2182.1 pg/mL — ABNORMAL HIGH (ref 18.5–88.1)

## 2022-12-14 LAB — PROTEIN / CREATININE RATIO, URINE
CREATININE, URINE: 185.1 mg/dL
PROTEIN URINE: 61.4 mg/dL
PROTEIN/CREAT RATIO, URINE: 0.332

## 2022-12-14 LAB — PHOSPHORUS: PHOSPHORUS: 1.8 mg/dL — ABNORMAL LOW (ref 2.4–5.1)

## 2022-12-14 LAB — MAGNESIUM: MAGNESIUM: 1.4 mg/dL — ABNORMAL LOW (ref 1.6–2.6)

## 2022-12-14 LAB — SLIDE REVIEW

## 2022-12-14 MED ORDER — CARVEDILOL 12.5 MG TABLET
ORAL_TABLET | Freq: Two times a day (BID) | ORAL | 0 refills | 30 days | Status: CP
Start: 2022-12-14 — End: 2023-01-13

## 2022-12-14 MED ADMIN — darbepoetin alfa-polysorbate (ARANESP) injection 100 mcg: 100 ug | SUBCUTANEOUS | @ 19:00:00 | Stop: 2022-12-14

## 2022-12-14 MED ADMIN — filgrastim-aafi (NIVESTYM) injection syringe 480 mcg: 480 ug | SUBCUTANEOUS | @ 19:00:00 | Stop: 2022-12-14

## 2022-12-14 NOTE — Unmapped (Signed)
[facility] w/ patient in [facility] today. Reviewed meds/symptoms. Any new medications?                 Fever/cold/flu symptoms denies  BP: 114/66 today/ Home BP reported 120/80  BG: not checking  Headache/Dizziness/Lightheaded: denies  Hand tremors: denies  Numbness/tingling: denies  Fevers/chills/sweats: denies  CP/SOB/palpatations: denies  Nausea/vomiting/heartburn: denies  Diarrhea/constipation: denies  UTI symptoms (burn/pain/itch/frequency/urgency/odor/color/foam): denies  No visible or palpable edema     Appetite good; reports adequate hydration. 4-5 oz/bottles/fluid per day.     Pt reports being well rested and getting adequate exercise.     Continues to follow Covid/health safety precautions by taking care to mask, perform frequent hand hygeine and minimal public activity. Offered support and guidance for this process given their immune-suppressed state. We discussed reduced covid vaccine coverage for transplant patients and importance of continuing to mask and practice safe distancing. Commented that booster vaccines will likely be advised as an ongoing process.        Last tacrolimus taken 0900; held for this morning's labs. Current dose 10 mg bid; Imuran 150 mg   daily, Pred 5 mg daily     Other complaints or concerns: reports continued fatigue - hgb 9.0    Referrals needed: n/a     Pt Follow up: labs today, EGD, parathyroidectomy     Immunization status:      Functional Score: 100     Employment/work status:  works full time' - on [employer]

## 2022-12-14 NOTE — Unmapped (Signed)
[facility]   TRANSPLANT FOLLOW UP     PCP: Belva Bertin, RTL   Kidney transplant coordinator: Celene Squibb    Date of Visit at Transplant clinic: 12/14/2022     Assessment/Recommendations:     # s/p deceased donor kidney transplant 09/05/2022   Native kidney disease collapsing FSGS/HIVAN  Graft function: creatinine 1.7-2.1  DSAs: Has an anti-DQ with MFI 3299  Biomarkers: plan for Allosure q3 months due to high risk for rejection and modified immunosuppression related to medication intolerances and cytopenias  Post-surgical issues:  - aspirin for 1 year post-op    # Immunosuppression  Tacrolimus (Prograf) 10 mg BID, goal trough 6-8 ng/mL  Azathioprine 150 mg daily, goal to boost to 200 mg in future when cytopenias improve  Prednisone 5 mg daily (indicated due to HIVAN)  Initially MMF but did not tolerate due to gastritis; he may benefit from trial of Myfortic in future due to his elevated rejection risk    # Acute issues today  None  Hx gastritis+PUD, tx PPI, is due for followup endoscopy, pt plans to schedule it in November    # BP management   Goal 130/80, as tolerated.  - carvedilol to 12.5 mg BID  Intolerances to amlodipine/nifedipine and hydralazine, hives to metoprolol and labetalol.    # Infectious disease  CMV: D-/R+ EBV: D+/R+   CMV ppx:  Patient chose to do preemptive monitoring, never took Valcyte  PJP ppx: Strongly recommend atovoquone, he his high risk for PJP.   Bactrim allergy. Had acute resp distress to pentamadine. Had cytopenias to dapsone.  Follows with ID for his HIV, on Biktarvy    # Heme  - gave Aranesp 100 mcg in clinic today 10/17  - gave filgrastim 480 mcg in clinic today 10/17    # Cardiovascular: primary prevention  The ASCVD Risk score (Arnett DK, et al., 2019) failed to calculate.    # CKD-BMD  # Hyperparathyroidism s/p parathyroidetomy April 2021, with recurrent hyperparathyroidism   Hypercalcemia: drinking 2L fluid daily. Allergic to cinacaclet and there is not a good alternative.  - had appt with Dr. Selena Batten on 10/1 to discuss parathyroidectomy, he will call to schedule the surgery for Dec-Jan based on his caregiver's work schedule.    # Electrolytes  Mg: taking Mg gummy OTC  K acceptable, slightly high but safe range    # Immunizations  Immunization History   Administered Date(s) Administered    COVID-19 VAC,BIVALENT,MODERNA(BLUE CAP) 10/10/2021    COVID-19 VACCINE,MRNA(MODERNA)(PF) 09/28/2019, 10/26/2019    HEPATITIS B VACCINE ADULT, ADJUVANTED, IM(HEPLISAV B) 01/16/2020, 03/18/2020, 05/12/2020    HEPATITIS B VACCINE ADULT,IM(ENERGIX B, RECOMBIVAX) 03/30/2015, 01/27/2016    Hepatitis A (Adult) 09/04/2016, 04/26/2017, 10/10/2021    Hepatitis A Vaccine - Unspecified Formulation 09/04/2016, 04/26/2017    Hepatitis B Vaccine, Unspecified Formulation 12/04/1994, 01/09/1995, 06/12/1995    INFLUENZA TIV (TRI) 53MO+ W/ PRESERV (IM) 11/20/2013, 11/11/2014, 03/15/2016, 11/27/2017, 12/11/2018    INFLUENZA TIV (TRI) PF (IM) 03/15/2016    Influenza Vaccine PF(Quad)(Egg Free)18+(Flublok) 12/09/2021    Influenza Vaccine Quad (IIV4 W/PRESERV) 53MO+ 11/01/2016, 11/26/2018    Influenza Vaccine Quad(IM)6 MO-Adult(PF) 11/20/2013, 11/11/2014, 11/29/2020    Influenza Virus Vaccine, unspecified formulation 11/27/2017, 12/11/2018, 12/12/2019, 11/27/2020    MENINGOCOCCAL VACCINE, A,C,Y, W-135(IM)(MENVEO) 07/10/2016, 03/22/2017    MMR 01/09/1995    Meningococcal Conjugate MCV4P 07/10/2016, 03/22/2017    PNEUMOCOCCAL POLYSACCHARIDE 23-VALENT 11/20/2013, 11/20/2013, 11/26/2018, 11/26/2018    Pneumococcal Conjugate 13-Valent 08/28/2017, 08/28/2017, 12/12/2019    TD(TDVAX),ADSORBED,2LF(IM)(PF) 06/12/1995  TdaP 03/08/2018, 03/08/2018       # Cancer screening  Colonoscopy: start age 53  Skin: recommend yearly dermatology evaluation    # Follow up:  Labs weekly  Visits RTC 2 months      Kidney Transplant History:   Date of Transplant: 09/05/2022 (Kidney)  Type of Transplant: DDKT  KDPI: 18%  Cold ischemic time: 972 minutes  Warm ischemic time: 40 minutes  cPRA: 0-7%  HLA match: 4/6  Blood type: Donor O, Recipient O POS  ID: CMV: D-/R+ EBV: D+/R+, HCV donor serologies not immediately available (requesting this info)  Native Kidney Disease: collapsing FSGS/HIVAN (biopsy proven)    Native kidney biopsy: yes   Pre-transplant dialysis course: hemodialysis since 02/11/2015   Post-Transplant Course:    Delayed graft function requiring dialysis: no   Other complications: readmitted 7/31-8/2 for nausea/vomiting  Prior Transplants: None  Induction: thymoglobulin  Early steroid withdrawal: No (due to HIVAN)    Biopsies:   Zero-Hour Biopsy: none    History of Presenting Illness:     Since the last visit:  - BP 120s/80s at home (since last visit he needed to stop minoxidil)  - has fatigue as a top concern today    Concerns about nonadherence: No    Social: lives in [place]. Staying with his mother in [place] during recovery. 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     Review of Systems:   A 12-system review was negative except as documented in the HPI.    Physical Exam:     BP 114/66 (BP Site: R Arm, BP Position: Sitting, BP Cuff Size: Medium)  - Pulse 95  - Temp 36.6 ??C (97.9 ??F) (Temporal)  - Ht 170.2 cm (5' 7)  - Wt 69 kg (152 lb 3.2 oz)  - BMI 23.84 kg/m??   Constitutional:  Well-appearing in NAD  Eyes:  anicteric sclerae  ENT:  MMM  CV:  RRR, no m/r/g, no JVD, extremities WWP with no edema  Resp:  Good air movement, CTAB  GI:  Abdomen soft, NTND, +bs  MSK:  Grossly normal, exam is limited  Skin:  Normal turgor, no rash  Neuro:  Grossly normal, exam is limited  Psych:  Normal affect  Dialysis access: LUE AVF forearm      Allergies:   Allergies   Allergen Reactions    Amlodipine Hives, Palpitations, Rash and Other (See Comments)     Causes fatigue      Doxercalciferol Hives    Fish Containing Products Hives and Shortness Of Breath     Please do not serve fish for meals. Highly allergic         Iron Sucrose Hives    Labetalol Itching and Rash    Metoclopramide Hives and Other (See Comments)     Causes ticks/ jerks    Metoprolol Hives    Nifedipine Hives, Itching and Shortness Of Breath    Other Swelling     Suture material, localized swelling at closure site    Sulfamethoxazole-Trimethoprim Rash and Other (See Comments)     Renal failure      Vancomycin Hives and Other (See Comments)     Patient states vancomycin caused kidney injury    Calcitonin     Peanut Oil Other (See Comments)    Potassium Clavulanate     Azathioprine Itching and Rash    Clindamycin Other (See Comments) and Palpitations     Chest pain  Hydralazine Itching and Dizziness           Sensipar [Cinacalcet] Hives        Current Medications:   Current Outpatient Medications   Medication Sig Dispense Refill    acetaminophen (TYLENOL) 160 mg/5 mL solution Take 20.3 mL (650 mg total) by mouth every six (6) hours as needed for fever or pain. 473 mL 0    albuterol HFA 90 mcg/actuation inhaler Inhale 1-2 puffs every four (4) to six (6) hours as needed for wheezing or shortness of breath. 18 g 11    atovaquone (MEPRON) 750 mg/5 mL suspension Take 10 mL (1,500 mg total) by mouth daily. 300 mL 2    azathioprine (AZASAN) 100 mg tablet Take 1 tablet (100 mg total) by mouth daily. Take in addition to 50 mg tablet for a total daily dose of 150 mg daily 90 tablet 3    azathioprine (IMURAN) 50 mg tablet Take 1 tablet (50 mg total) by mouth daily. Take in addition to 100 mg tablet for a total daily dose to of 150 mg daily 30 tablet 11    bictegrav-emtricit-tenofov ala (BIKTARVY) 50-200-25 mg tablet Take 1 tablet by mouth every morning. 30 tablet 11    calamine-zinc oxide 8-8 % Lotn Apply topically four (4) times a day. 177 mL 0    carvedilol (COREG) 25 MG tablet Take 2 tablets (50 mg total) by mouth two (2) times a day. 360 tablet 3    cetirizine (ZYRTEC) 10 MG tablet Take 1 tablet (10 mg total) by mouth daily as needed (Itching). (Patient not taking: Reported on 10/27/2022) 30 tablet 11    diphenhydrAMINE (BENADRYL) 25 mg capsule Take 1 capsule (25 mg total) by mouth every six (6) hours as needed for itching. 96 capsule 0    docusate sodium (COLACE) 100 MG capsule Take 1 capsule (100 mg total) by mouth two (2) times a day as needed for constipation. (Patient not taking: Reported on 10/27/2022) 60 capsule 11    empty container Misc Use as directed with Zarxio 1 each 0    filgrastim-sndz (ZARXIO) 480 mcg/0.8 mL Syrg Inject 0.8 mL (480 mcg total) under the skin once a week. Administer only as directed by transplant team. 3.2 mL 1    hydrocortisone 1 % cream Apply topically two (2) times a day. (Patient not taking: Reported on 10/27/2022) 30 g 11    magnesium oxide-Mg AA chelate (MAGNESIUM, AMINO ACID CHELATE,) 133 mg Take 1 tablet by mouth two (2) times a day. Hold until directed to take by nurse coordinator (Patient not taking: Reported on 10/27/2022) 60 tablet 11    minoxidil (LONITEN) 2.5 MG tablet Take 1 tablet (2.5 mg total) by mouth two (2) times a day. (Patient not taking: Reported on 11/28/2022) 180 tablet 3    naloxone (NARCAN) 4 mg nasal spray SPRAY TO 1 NOSTRIL IN CASE OF OPIOID OVERDOSE, MAY REPEAT IN 2-3 MINUTES UNTIL EMS ARRIVES OR RESPONSIVE (Patient not taking: Reported on 10/27/2022)      pantoprazole (PROTONIX) 40 MG tablet Take 1 tablet (40 mg total) by mouth two (2) times a day. 180 tablet 3    predniSONE (DELTASONE) 5 MG tablet Take 1 tablet (5 mg total) by mouth in the morning. 90 tablet 3    prochlorperazine (COMPAZINE) 5 MG tablet Take 1 tablet (5 mg total) by mouth every eight (8) hours as needed for nausea. (Patient not taking: Reported on 10/27/2022) 30 tablet 0    sodium bicarbonate  650 mg tablet Take 1 tablet (650 mg total) by mouth two (2) times a day. (Patient not taking: Reported on 10/27/2022) 180 tablet 3    tacrolimus (PROGRAF) 1 MG capsule Take 1 capsule (1 mg total) by mouth two (2) times a day. Take in addition to 5 mg tablets for a total dose of 11 mg twice daily. 180 capsule 3    tacrolimus (PROGRAF) 5 MG capsule Take 2 capsules (10 mg total) by mouth two (2) times a day. Take in addition to 1 mg tablets for a total dose of 11 mg twice daily. 360 capsule 3    valGANciclovir (VALCYTE) 450 mg tablet Take 1 tablet (450 mg total) by mouth daily. (Patient not taking: Reported on 10/27/2022) 30 tablet 1     Current Facility-Administered Medications   Medication Dose Route Frequency Provider Last Rate Last Admin    cephalexin (KEFLEX) capsule 500 mg  500 mg Oral Once Lewie Chamber, MD        sodium chloride 0.9 % (NS) bag 1,000 mL  1,000 mL Intravenous Once Leafy Half, MD   Stopped at 10/06/22 1550       Past Medical History:   Past Medical History:   Diagnosis Date    Acquired immunodeficiency syndrome (CMS-HCC) 11/14/2013    Formatting of this note might be different from the original.   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   He appears to be doing well. States he is back to 90% except for his leg swelling.    Will change his epivir to 150 mg daily when he completes his liquid rx. Will see him back in 1 month and repeat his labs th    Acute deep vein thrombosis (DVT) of popliteal vein of left lower extremity (CMS-HCC) 02/05/2019    Last Assessment & Plan:   Formatting of this note might be different from the original.  Moderate effusion left knee new the some limitation of motion.  No evidence of sepsis the white count the normal  will     Proceed on with an aspiration for cell count, culture and crystal analysis.  New x-rays ordered and currently pending will follow.  Further therapeutic recommendations contingent on the re    Anemia     Asthma     Candidal esophagitis (CMS-HCC) 07/30/2022    Community acquired pneumonia 07/30/2014    DVT (deep venous thrombosis) (CMS-HCC) 11/24/2013    Last Assessment & Plan:    Formatting of this note might be different from the original.   Appears to be doing better (swelling improved). He will f/u with PCP regarding coumadin dosing (INR is low).  Formatting of this note might be different from the original.   Last Assessment & Plan:    Formatting of this note might be different from the original.   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   greatly appreciate renal f/u.  Formatting of this note might be different from the original.   Last Assessment & Plan:    greatly appreciate renal f/u.   Last Assessment & Plan:    greatly appreciate  renal f/u.  Formatting of this note might be different from the original.   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        Electronically signed by:   Leafy Half, MD  [facility]

## 2022-12-14 NOTE — Unmapped (Signed)
[facility] TRANSPLANT [facility]INIC PHARMACY NOTE  Christian Brown  [id number]    Medication changes today:   Decrease carvedilol to 12.5 mg BID   Stop minoxidil   Filgastrim 480 mcg X1 and Aranesp 100 mcg X1 at clinic today   Stop sodium bicarbonate 650 mg BID     Education/Adherence tools provided today:  - Provided updated medication list  - Provided additional education on immunosuppression and transplant related medications including reviewing indications of medications, dosing and side effects  - Discussed adherence reminder tools such as cell phone alarms    Follow up items:  1. Goal of understanding indications and dosing of immunosuppression medications  2. Leukopenia and anemia   3. CMV monitoring & DSAs   5. CD4 & HIV VL   6. Ca, plans to have parathyroidectomy in December  7. PPI BID duration pending rpt EGD     Next visit with pharmacy in 1-3 months  ____________________________________________________________________    Christian Brown is a 42 y.o. 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 POD3 transferred to SICU for hypertensive emergency and placed on nicardipine gtt. Transferred back to floor on POD6 and developed gastritis and esophagitis.   Readmitted to [facility] 09/27/22-09/29/22 for incisional pain and drainage. Found to be hyperkalemic (5.8) and treated with Lokelma and insulin.    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  Plan: Rpt EGD 2 mo from discharge, dc Cellcept, PPI BID X min 8 wks   8/20 de novo DSA- high rejection risk (HIV, unable to be on MMF)   IVIG 0.5 g/kg - declined; azathioprine inc'd to 200 mg daily & fu DSA   9/12 Zarzio started   9/25: Dec'd Imuran to150 mg daily, Aranesp 2nd dose    9/26 dapsone dc'd 2/2 anemia Hgb 7  9/30: Minodixil dec'd to 2.5 mg BID   10/1 Surg Onc visit: rec'd parathyroidectomy for 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    Fluid Status:   Edema no, SOB no  I/Os: reports not drinking much water   Plan: Encouraged pt to increase fluid intake. Continue to monitor      Vitals:    12/14/22 1407   BP: 114/66   Pulse: 95   Temp: 36.6 ??C (97.9 ??F)   Reflective before BP med     ___________________________________________________________________    Allergies   Allergen Reactions    Amlodipine Hives, Palpitations, Rash and Other (See Comments)     Causes fatigue      Doxercalciferol Hives    Fish Containing Products Hives and Shortness  Of Breath     Please do not serve fish for meals. Highly allergic         Iron Sucrose Hives    Labetalol Itching and Rash    Metoclopramide Hives and Other (See Comments)     Causes ticks/ jerks    Metoprolol Hives    Nifedipine Hives, Itching and Shortness Of Breath    Other Swelling     Suture material, localized swelling at closure site    Sulfamethoxazole-Trimethoprim Rash and Other (See Comments)     Renal failure      Vancomycin Hives and Other (See Comments)     Patient states vancomycin caused kidney injury    Calcitonin     Peanut Oil Other (See Comments)    Potassium Clavulanate     Azathioprine Itching and Rash    Clindamycin Other (See Comments) and Palpitations     Chest pain    Hydralazine Itching and Dizziness           Sensipar [Cinacalcet] Hives       Medications reviewed in EPIC medication station and updated today by the clinical pharmacist practitioner.    Current Outpatient Medications   Medication Instructions    acetaminophen (TYLENOL) 650 mg, Oral, Every 6 hours PRN    albuterol HFA 90 mcg/actuation inhaler Inhale 1-2 puffs every four (4) to six (6) hours as needed for wheezing or shortness of breath.    atovaquone (MEPRON) 1,500 mg, Oral, Daily (standard)    azathioprine (AZASAN) 100 mg, Oral, Daily (standard), Take in addition to 50 mg tablet for a total daily dose of 150 mg daily    azathioprine (IMURAN) 50 mg, Oral, Daily (standard), Take in addition to 100 mg tablet for a total daily dose to of 150 mg daily    bictegrav-emtricit-tenofov ala (BIKTARVY) 50-200-25 mg tablet 1 tablet, Oral, Every morning    calamine-zinc oxide 8-8 % Lotn Topical, 4 times a day    carvedilol (COREG) 50 mg, Oral, 2 times a day (standard)    cetirizine (ZYRTEC) 10 mg, Oral, Daily PRN    diphenhydrAMINE (BENADRYL) 25 mg, Oral, Every 6 hours PRN    docusate sodium (COLACE) 100 mg, Oral, 2 times a day PRN    empty container Misc Use as directed with Zarxio    hydrocortisone 1 % cream Topical, 2 times a day (standard)    magnesium oxide-Mg AA chelate (MAGNESIUM, AMINO ACID CHELATE,) 133 mg 1 tablet, Oral, 2 times a day (standard), Hold until directed to take by nurse coordinator    minoxidil (LONITEN) 2.5 mg, Oral, 2 times a day (standard)    naloxone (NARCAN) 4 mg nasal spray SPRAY TO 1 NOSTRIL IN CASE OF OPIOID OVERDOSE, MAY REPEAT IN 2-3 MINUTES UNTIL EMS ARRIVES OR RESPONSIVE    pantoprazole (PROTONIX) 40 mg, Oral, 2 times a day (standard)    predniSONE (DELTASONE) 5 mg, Oral, Daily    prochlorperazine (COMPAZINE) 5 mg, Oral, Every 8 hours PRN    sodium bicarbonate 650 mg, Oral, 2 times a day (standard)    tacrolimus (PROGRAF) 10 mg, Oral, 2 times a day, Take in addition to 1 mg tablets for a total dose of 11 mg twice daily.    tacrolimus (PROGRAF) 1 mg, Oral, 2 times a day, Take in addition to 5 mg tablets for a total dose of 11 mg twice daily.    valGANciclovir (VALCYTE) 450 mg, Oral, Daily (standard)    ZARXIO 480 mcg, Subcutaneous, Weekly, Administer  only as directed by transplant team.       GRAFT FUNCTION: stable  Lab Results   Component Value Date    Creatinine 1.82 (H) 12/11/2022    Creatinine 1.73 (H) 12/07/2022    Creatinine 1.68 (H) 12/04/2022    Creatinine 1.90 (H) 11/30/2022       Baseline Scr: TBD  Scr nadir: TBD  Estimated Creatinine Clearance: 50.9 mL/min (A) (based on SCr of 1.82 mg/dL (H)).    Proteinuria/UPC: Yes: below.  Lab Results   Component Value Date    Protein/Creatinine Ratio, Urine 0.289 11/02/2022    Protein/Creatinine Ratio, Urine 0.288 10/06/2022    Protein/Creatinine Ratio, Urine 0.356 09/17/2022       DSA: positive improved 10/06/22 DQB #2 MFI 8511 (10/06/22) > 3299 (11/02/22)   Lab Results   Component Value Date    DSA Comment  11/02/2022      Comment:      The HLA antigens listed are donor antigens and the corresponding MFI value.  MFI values >= 1000 are considered positive.  A negative result does not exclude the presence of low level DSA.  Blank donor antigen and MFI fields indicate unavailable donor HLA   type or lack of appropriate single antigen bead to determine DSA.  As such, the presence or absence of DSA to the locus cannot be confirmed.     Donor specific antibody (DSA) testing is performed with a  solid phase multiplex single antigen bead array method.  All procedures and reagents have been validated and performance characteristics determined by the [facility].    Certain of these tests have not been cleared/approved by the U.S. Food and Drug Administration (FDA).  The FDA has determined that such approval/clearance is not necessary because this laboratory is certified under the Clinical Laboratory Improvements Amendments to perform high complexity testing.  This test is used for clinical purposes.  It should not be regarded as investigational or for research.  All HLA typings are performed using molecular methodologies.  HLA-A, B, C, DR, and DQ results are   serological equivalents of the molecular type.           Zero hour biopsy: Not preformed  Biopsies to date: NTD  Tacrolimus (Prograf) 10 mg BID  Azathioprine 150 mg daily  Prednisone 5 mg daily (HIV)    *converted to aza from MMF due to esophagitis/gastritis found on EGD 09/12/22      IMMUNOSUPPRESSION DRUG LEVELS:  10/3: 8.9   10/7: 6.9   10/10: 6.3  10/14: 7.5 (doesn't remember when took last dose before Monday's lvl)   Lab Results   Component Value Date    Tacrolimus, Trough 4.6 (L) 11/22/2022    Tacrolimus, Trough 8.1 10/27/2022    Tacrolimus, Trough 8.0 09/28/2022    Tacrolimus Lvl 7.5 12/11/2022    Tacrolimus Lvl 6.3 12/07/2022    Tacrolimus Lvl 6.9 12/04/2022     No results found for: CYCLO  No results found for: EVEROLIMUS  No results found for: SIROLIMUS      Patient is leukopenic, denies tremors, nerve pain, or headaches. Reports being on tac 10 mg BID, not 11 mg BID.     WBC/ANC:   low  Lab Results   Component Value Date    WBC 1.7 (LL) 12/11/2022   WBC: 1.7 12/11/22   Dec began 9/9, nadir of 1.1 9/16-9/25   Other previous values: 2s but most recently dipped 1.7   ANC: 0.9 12/11/22   Dec began  9/9, nadir of 0.5 9/19-9/25   Other previous values 1.2s-- most recently 0.9   Azathioprine:   s7/22-8/20 (150 mg) > 8/20-9/24 (200 mg) > d9/25 (150 mg daily)   Zarzio 480 mcg X1 at last clinic visit     Plan: Now 3 months out of transplant, goal 6-8. Will maintain current immunosuppression and give Zarzio 480 mcg X1 today. Counseled pt on keeping the tac 1 mg capsules on hand in case regimen changes. Continue to monitor.      OI Prophylaxis:   CMV Status: D-/ R+, moderate risk.   CMV prophylaxis: valganciclovir 450 mg daily x 3 months per protocol (pt never started taking)   Start Date: 10/06/22   CMV Prophylaxis End Date: 12/06/22  CMV T cell Immunity (11/22/22): had low lymphocyte count at this time   Lab Results   Component Value Date    CMV Quant Positive < 200 12/11/2022    CMV Quant Positive < 200 12/07/2022    CMV Quant Positive < 200 12/04/2022    CMV Quant Positive < 200 11/30/2022    CMV Quant Positive < 200 11/27/2022    CMV Quant Positive < 200 11/20/2022       PCP Prophylaxis:  none currently  x 6 months   PCP Prophylaxis End Date: 09/05/23   G6PD 12.3 on 09/15/22   *Received inhaled pentamidine x1 on 09/07/22 (bronchospasms given albuterol/IV Benadryl), hx of Bactrim allergy (vomiting on treatment dose Bactrim for PJP)  *Dapsone dc'd for anemia   Patient refusing atovaquone at this time, may be open to trying it if returns back to work     Thrush: completed in hospital    Patient is  off prophylaxis    Plan:  Once weekly CMV monitoring for at least 8 weeks .Consider pre-meds w/atovaquone and counsel pt to take atovaquone with food if willing to try.Continue to monitor.    CV Prophylaxis:  aspirin 81 mg (refused during initial discharge)  The ASCVD Risk score (Arnett DK, et al., 2019) failed to calculate.  No results found for: LIPID    Statin therapy: Not indicated; currently on none  Plan:  Recommend updated lipid panel post transplant - repeat lipid panel today. Continue to monitor     BP: Goal < 140/90. Clinic vitals reported above  Home BP ranges: 120s/80s, had some low SBPs <90     Current meds include: carvedilol 25 mg BID (now taking 12.5 mg hs), minoxidil 2.5 mg BID (stopped taking)   *allergies and/or adverse rxns to hydralazine (itching, dizziness), amlodipine (palpitations, hives, rash), nifedipine (hives, itching, sob), labetalol (?,refused in past), metoprolol (hives), clonidine (dizzy spells, not allowed at job with [employer])   Plan: within goal. Stop minoxidil and decrease carvedilol to 12.5 mg BID. Pt prefer to stay on carvedilol for now and open to being on valsartan. Consider switching to ARB at future visit. Continue to monitor    Anemia of CKD:  H/H: Still having fatigue, but sx & Hgb improved from being in 7s off dapsone   Hgb: 9.0 12/11/22    Dec began 9/16, nadir 7.0 9/23-9/25   Dapsone:   8/8-9/26    Aranesp 10/06/22, 11/22/22   Lab Results   Component Value Date    HGB 9.0 (L) 12/11/2022     Lab Results   Component Value Date    HCT 27.4 (L) 12/11/2022     Iron panel:  Lab Results   Component Value Date    IRON  82 10/06/2022    TIBC 265 10/06/2022    FERRITIN 161.8 10/06/2022     Lab Results   Component Value Date    Iron Saturation (%) 31 10/06/2022       Prior ESA use: Aranesp 100 mcg X1 on 10/06/22, 11/22/22     Plan: out of goal. Give Aranesp 100 mcg X1 today. Continue to monitor.     DM:   Lab Results   Component Value Date    A1C 4.1 (L) 09/05/2022   . Goal A1c < 7  History of Dm? No  Established with endocrinologist/PCP for BG managment? No  Currently on: None    Diet: Not fully assessed, drinking 2 large bottles of soda a day   Hypoglycemia: no  Plan: No change.   Continue to monitor - repeat A1c today.       HIV: Diagnosed in 2015; pre-ART VL 98,300 (10/2013)  VL UD (10/27/22)   CD4 count: 437, CD4% 32 (05/25/22)   Current meds: Biktarvy 50-200-25 mg daily    Plan: Repeat CD4 count + VL today. Monitor CD4/HIV viral load every 60-90 days for 12 months post transplant. Needs to schedule ICID follow up.    Electrolytes: Mag is low, Ca high   Lab Results   Component Value Date    Potassium 4.8 12/11/2022    Sodium 139 12/11/2022    Magnesium 1.5 (L) 12/11/2022    CO2 18 (L) 12/11/2022    Phosphorus 1.8 (L) 11/22/2022   Phos 2.5 12/11/22     Meds currently on: sodium bicarb 650 mg BID (stopped taking in August), magnesium plus 133 mg BID (not taking, taking Mg gummy once daily)   Plan: Stop sodium bicarbonate. Continue to monitor     GI/BM: pt reports  regular Bms, no diarrhea nor constipation . No acid reflux or itching.   Has been on PPI BID therapy since ~ 07/30/22. Per GI, min of 8 wks of PPI BID therapy.   Meds currently on: docusate PRN (not taking), pantoprazole 40 mg BID, compazine PRN (not taking), cetirizine PRN (not taking), benadryl PRN (taking once hs)   Plan: No change.  Continue PPI BID until repeat EGD results. Continue to monitor      Pain: pt reports no pain  Meds currently on: oxycodone 5 mg Q4H PRN (not taking), APAP PRN (not taking)   Plan: No change.  Continue to monitor    Bone health:   Vitamin D Level: last level is wnl . Goal > 30.   Lab Results   Component Value Date    Vitamin D Total (25OH) 32.9 10/06/2022       Lab Results   Component Value Date    Calcium 11.7 (H) 12/11/2022    Calcium 12.1 (H) 12/07/2022       Last DEXA results:  none available  Current meds include: none *allergic to calcitonin and cincalcet  Plan: Vitamin D level  within goal,not on supplementation at this time. Continue to monitor. Plan to get parathyroidectomy w/surg onc for R inferior parathyroid adenoma.     Women's/Men's Health:  Christian Brown is a 42 y.o. male. Patient reports no men's/women's health issues  Plan: Continue to monitor    Immunizations:  Influenza [Annual]: Received 12/09/21    PCV13: Received 12/12/19  PPSV23: Received 11/26/18  PCV20: Never received     Menningoccal conjugate: 07/10/16, 03/22/17    Shingrix Zoster [2 doses, 2 - 6 months apart]: Never received  COVID-19 [3 primary doses, 2 boosters]: 1st dose given 09/28/19, 2nd dose given 10/26/19, and Booster given 10/10/21    Immunization status: Needs flu and COVID-19 vaccine. Declined today. Pt may be amenable if he returns back to work.     Pharmacy preference:  [facility]  Medication Refills:  N/A  Medication Access:  N/A    Adherence: Patient has average understanding of medications; was able to independently identify names/doses of immunosuppressants and OI meds.  Patient  does not fill their own pill box on a regular basis at home. Not using a pill box at home.  Patient brought medication card:no  Pill box:did not bring  Plan: Discussed use of pill box at home, at this time patient prefers to not use a pill box; provided moderate adherence counseling/intervention    Patient was reviewed with [name] who was agreement with the stated plan:     During this visit, the following was completed:   BG log data assessment  BP log data assessment  Labs ordered and evaluated  complex treatment plan >1 DS   I spent a total of 30 minutes face to face with the patient delivering clinical care and providing education/counseling.    All questions/concerns were addressed to the patient's satisfaction.  __________________________________________  Karie Georges, PharmD  PGY-2 Pharmacotherapy Resident

## 2022-12-15 LAB — CMV DNA, QUANTITATIVE, PCR: CMV VIRAL LD: NOT DETECTED

## 2022-12-15 LAB — BK VIRUS QUANTITATIVE PCR, BLOOD: BK BLOOD RESULT: NOT DETECTED

## 2022-12-18 DIAGNOSIS — I158 Other secondary hypertension: Principal | ICD-10-CM

## 2022-12-18 DIAGNOSIS — Z949 Transplanted organ and tissue status, unspecified: Principal | ICD-10-CM

## 2022-12-18 LAB — HEMOGLOBIN A1C
ESTIMATED AVERAGE GLUCOSE: 82 mg/dL
HEMOGLOBIN A1C: 4.5 % — ABNORMAL LOW (ref 4.8–5.6)

## 2022-12-18 LAB — VITAMIN D 25 HYDROXY: VITAMIN D, TOTAL (25OH): 19.1 ng/mL — ABNORMAL LOW (ref 20.0–80.0)

## 2022-12-18 MED ORDER — CARVEDILOL 12.5 MG TABLET
ORAL_TABLET | Freq: Two times a day (BID) | ORAL | 11 refills | 30 days | Status: CP
Start: 2022-12-18 — End: 2023-12-18
  Filled 2022-12-21: qty 60, 30d supply, fill #0

## 2022-12-19 LAB — CBC W/ DIFFERENTIAL
BANDED NEUTROPHILS ABSOLUTE COUNT: 0.1 10*3/uL (ref 0.0–0.1)
BASOPHILS ABSOLUTE COUNT: 0 10*3/uL (ref 0.0–0.2)
BASOPHILS RELATIVE PERCENT: 1 %
EOSINOPHILS ABSOLUTE COUNT: 0 10*3/uL (ref 0.0–0.4)
EOSINOPHILS RELATIVE PERCENT: 2 %
HEMATOCRIT: 31.9 % — ABNORMAL LOW (ref 37.5–51.0)
HEMOGLOBIN: 10.5 g/dL — ABNORMAL LOW (ref 13.0–17.7)
IMMATURE GRANULOCYTES: 2 %
LYMPHOCYTES ABSOLUTE COUNT: 0.6 10*3/uL — ABNORMAL LOW (ref 0.7–3.1)
LYMPHOCYTES RELATIVE PERCENT: 25 %
MEAN CORPUSCULAR HEMOGLOBIN CONC: 32.9 g/dL (ref 31.5–35.7)
MEAN CORPUSCULAR HEMOGLOBIN: 37 pg — ABNORMAL HIGH (ref 26.6–33.0)
MEAN CORPUSCULAR VOLUME: 112 fL — ABNORMAL HIGH (ref 79–97)
MONOCYTES ABSOLUTE COUNT: 0.4 10*3/uL (ref 0.1–0.9)
MONOCYTES RELATIVE PERCENT: 16 %
NEUTROPHILS ABSOLUTE COUNT: 1.3 10*3/uL — ABNORMAL LOW (ref 1.4–7.0)
NEUTROPHILS RELATIVE PERCENT: 54 %
NUCLEATED RED BLOOD CELLS: 2 % — ABNORMAL HIGH (ref 0–0)
PLATELET COUNT: 208 10*3/uL (ref 150–450)
RED BLOOD CELL COUNT: 2.84 x10E6/uL — ABNORMAL LOW (ref 4.14–5.80)
RED CELL DISTRIBUTION WIDTH: 19.2 % — ABNORMAL HIGH (ref 11.6–15.4)
WHITE BLOOD CELL COUNT: 2.5 10*3/uL — CL (ref 3.4–10.8)

## 2022-12-19 LAB — MAGNESIUM: MAGNESIUM: 1.4 mg/dL — ABNORMAL LOW (ref 1.6–2.3)

## 2022-12-19 LAB — RENAL FUNCTION PANEL
ALBUMIN: 3.9 g/dL — ABNORMAL LOW (ref 4.1–5.1)
BLOOD UREA NITROGEN: 12 mg/dL (ref 6–20)
BUN / CREAT RATIO: 7 — ABNORMAL LOW (ref 9–20)
CALCIUM: 12.1 mg/dL — ABNORMAL HIGH (ref 8.7–10.2)
CHLORIDE: 110 mmol/L — ABNORMAL HIGH (ref 96–106)
CO2: 17 mmol/L — ABNORMAL LOW (ref 20–29)
CREATININE: 1.69 mg/dL — ABNORMAL HIGH (ref 0.76–1.27)
EGFR: 52 mL/min/{1.73_m2} — ABNORMAL LOW
GLUCOSE: 102 mg/dL — ABNORMAL HIGH (ref 70–99)
PHOSPHORUS, SERUM: 2.4 mg/dL — ABNORMAL LOW (ref 2.8–4.1)
POTASSIUM: 4.6 mmol/L (ref 3.5–5.2)
SODIUM: 141 mmol/L (ref 134–144)

## 2022-12-19 NOTE — Unmapped (Signed)
[facility] and Home Delivery Pharmacy Refill Coordination Note    Specialty Medication(s) to be Shipped:   Transplant: tacrolimus 5mg , Prednisone 5mg , BIKTARVY 50-200-25 mg tablet (bictegrav-emtricit-tenofov ala), and Azathioprine 50 and 100    Other medication(s) to be shipped:  Ventolin HFA, carvedilol     Christian Brown, DOB: 05-23-1980  Phone: [phone] (home)       All above HIPAA information was verified with patient.     Was a Nurse, learning disability used for this call? No    Completed refill call assessment today to schedule patient's medication shipment from the [facility] Delivery Pharmacy  [phone]).  All relevant notes have been reviewed.     Specialty medication(s) and dose(s) confirmed: Regimen is correct and unchanged.   Changes to medications: Aaditya reports no changes at this time.  Changes to insurance: No  New side effects reported not previously addressed with a pharmacist or physician: None reported  Questions for the pharmacist: No    Confirmed patient received a Conservation officer, historic buildings and a Surveyor, mining with first shipment. The patient will receive a drug information handout for each medication shipped and additional FDA Medication Guides as required.       DISEASE/MEDICATION-SPECIFIC INFORMATION        N/A    SPECIALTY MEDICATION ADHERENCE     Medication Adherence    Patient reported X missed doses in the last month: 0  Specialty Medication: BIKTARVY 50-200-25 mg tablet (bictegrav-emtricit-tenofov ala)  Patient is on additional specialty medications: Yes  Additional Specialty Medications: predniSONE 5 MG tablet (DELTASONE)  Patient Reported Additional Medication X Missed Doses in the Last Month: 0  Patient is on more than two specialty medications: Yes  Specialty Medication: tacrolimus 5 MG capsule (PROGRAF)  Patient Reported Additional Medication X Missed Doses in the Last Month: 0  Specialty Medication: azathioprine 50 mg tablet (IMURAN)  Patient Reported Additional Medication X Missed Doses in the Last Month: 0  Specialty Medication: azathioprine 100 mg tablet (AZASAN)  Patient Reported Additional Medication X Missed Doses in the Last Month: 0  Any gaps in refill history greater than 2 weeks in the last 3 months: no  Demonstrates understanding of importance of adherence: yes  Informant: patient  Reliability of informant: reliable  Provider-estimated medication adherence level: good  Patient is at risk for Non-Adherence: No  Reasons for non-adherence: no problems identified              Were doses missed due to medication being on hold? No    BIKTARVY 50-200-25 mg tablet (bictegrav-emtricit-tenofov ala)  : 8 days of medicine on hand   predniSONE 5 MG tablet (DELTASONE)  : 8 days of medicine on hand   tacrolimus 5 MG capsule (PROGRAF)  : 8 days of medicine on hand   azathioprine 100 mg tablet (AZASAN)  : 9 days of medicine on hand   azathioprine 50 mg tablet (IMURAN)  : 8 days of medicine on hand     REFERRAL TO PHARMACIST     Referral to the pharmacist: Not needed      SHIPPING     Shipping address confirmed in Epic.       Delivery Scheduled: Yes, Expected medication delivery date: 12/22/22.     Medication will be delivered via UPS to the prescription address in Epic WAM.    [facility]  Specialty Technician

## 2022-12-20 LAB — CMV DNA, QUANTITATIVE, PCR: CMV QUANT: NEGATIVE [IU]/mL

## 2022-12-20 LAB — TACROLIMUS LEVEL: TACROLIMUS BLOOD: 7.1 ng/mL (ref 2.0–20.0)

## 2022-12-20 NOTE — Unmapped (Signed)
Khingston Oetken 's entire shipment will be rescheduled as a result of the medication is too soon to refill until 12/21/2022 [facility]).     I have spoken with the patient  at [phone]  and communicated the delivery change. We will reschedule the medication for the delivery date that the patient agreed upon.  We have confirmed the delivery date as 12/22/2022, via ups.

## 2022-12-21 MED FILL — TACROLIMUS 5 MG CAPSULE, IMMEDIATE-RELEASE: ORAL | 30 days supply | Qty: 120 | Fill #1

## 2022-12-21 MED FILL — BIKTARVY 50 MG-200 MG-25 MG TABLET: ORAL | 30 days supply | Qty: 30 | Fill #2

## 2022-12-21 MED FILL — AZATHIOPRINE 50 MG TABLET: ORAL | 30 days supply | Qty: 30 | Fill #1

## 2022-12-21 MED FILL — VENTOLIN HFA 90 MCG/ACTUATION AEROSOL INHALER: RESPIRATORY_TRACT | 17 days supply | Qty: 18 | Fill #1

## 2022-12-21 MED FILL — PREDNISONE 5 MG TABLET: ORAL | 30 days supply | Qty: 30 | Fill #1

## 2022-12-21 MED FILL — AZATHIOPRINE 100 MG TABLET: ORAL | 30 days supply | Qty: 30 | Fill #1

## 2022-12-22 LAB — HLA DS POST TRANSPLANT
ANTI-DONOR DRW #1 MFI: 17 MFI
ANTI-DONOR DRW #2 MFI: 29 MFI
ANTI-DONOR HLA-A #1 MFI: 0 MFI
ANTI-DONOR HLA-A #2 MFI: 0 MFI
ANTI-DONOR HLA-B #1 MFI: 22 MFI
ANTI-DONOR HLA-B #2 MFI: 3 MFI
ANTI-DONOR HLA-C #1 MFI: 18 MFI
ANTI-DONOR HLA-DQB #1 MFI: 0 MFI
ANTI-DONOR HLA-DQB #2 MFI: 515 MFI
ANTI-DONOR HLA-DR #1 MFI: 25 MFI
ANTI-DONOR HLA-DR #2 MFI: 25 MFI

## 2022-12-22 LAB — FSAB CLASS 1 ANTIBODY SPECIFICITY: HLA CLASS 1 ANTIBODY RESULT: POSITIVE

## 2022-12-22 LAB — FSAB CLASS 2 ANTIBODY SPECIFICITY: HLA CL2 AB RESULT: POSITIVE

## 2022-12-22 NOTE — Unmapped (Signed)
Olis, Swanner contacted the [employer] regarding the following:    - states that he is returning a call about if he wants to do his surgery in December, Casmir states that he does want to do surgery in December but he needs it any day after 02/13/23, requests a VM be left he doesn't answer    Please contact Jaquaylon at [phone].    Thanks in advance,    Rosary Lively  [facility]   [phone]

## 2022-12-25 LAB — VITAMIN D 1,25 DIHYDROXY: VITAMIN D 1,25-DIHYDROXY: 48 pg/mL

## 2022-12-25 LAB — CBC W/ DIFFERENTIAL
BANDED NEUTROPHILS ABSOLUTE COUNT: 0 10*3/uL (ref 0.0–0.1)
BASOPHILS ABSOLUTE COUNT: 0 10*3/uL (ref 0.0–0.2)
BASOPHILS RELATIVE PERCENT: 1 %
EOSINOPHILS ABSOLUTE COUNT: 0 10*3/uL (ref 0.0–0.4)
EOSINOPHILS RELATIVE PERCENT: 1 %
HEMATOCRIT: 36.2 % — ABNORMAL LOW (ref 37.5–51.0)
HEMOGLOBIN: 12 g/dL — ABNORMAL LOW (ref 13.0–17.7)
IMMATURE GRANULOCYTES: 1 %
LYMPHOCYTES ABSOLUTE COUNT: 0.6 10*3/uL — ABNORMAL LOW (ref 0.7–3.1)
LYMPHOCYTES RELATIVE PERCENT: 27 %
MEAN CORPUSCULAR HEMOGLOBIN CONC: 33.1 g/dL (ref 31.5–35.7)
MEAN CORPUSCULAR HEMOGLOBIN: 36.8 pg — ABNORMAL HIGH (ref 26.6–33.0)
MEAN CORPUSCULAR VOLUME: 111 fL — ABNORMAL HIGH (ref 79–97)
MONOCYTES ABSOLUTE COUNT: 0.3 10*3/uL (ref 0.1–0.9)
MONOCYTES RELATIVE PERCENT: 11 %
NEUTROPHILS ABSOLUTE COUNT: 1.4 10*3/uL (ref 1.4–7.0)
NEUTROPHILS RELATIVE PERCENT: 59 %
NUCLEATED RED BLOOD CELLS: 1 % — ABNORMAL HIGH (ref 0–0)
PLATELET COUNT: 188 10*3/uL (ref 150–450)
RED BLOOD CELL COUNT: 3.26 x10E6/uL — ABNORMAL LOW (ref 4.14–5.80)
RED CELL DISTRIBUTION WIDTH: 17 % — ABNORMAL HIGH (ref 11.6–15.4)
WHITE BLOOD CELL COUNT: 2.3 10*3/uL — CL (ref 3.4–10.8)

## 2022-12-26 LAB — RENAL FUNCTION PANEL
ALBUMIN: 3.8 g/dL — ABNORMAL LOW (ref 4.1–5.1)
BLOOD UREA NITROGEN: 13 mg/dL (ref 6–20)
BUN / CREAT RATIO: 8 — ABNORMAL LOW (ref 9–20)
CALCIUM: 12.2 mg/dL — ABNORMAL HIGH (ref 8.7–10.2)
CHLORIDE: 110 mmol/L — ABNORMAL HIGH (ref 96–106)
CO2: 20 mmol/L (ref 20–29)
CREATININE: 1.65 mg/dL — ABNORMAL HIGH (ref 0.76–1.27)
EGFR: 54 mL/min/{1.73_m2} — ABNORMAL LOW
GLUCOSE: 94 mg/dL (ref 70–99)
PHOSPHORUS, SERUM: 2.4 mg/dL — ABNORMAL LOW (ref 2.8–4.1)
POTASSIUM: 4.6 mmol/L (ref 3.5–5.2)
SODIUM: 140 mmol/L (ref 134–144)

## 2022-12-26 LAB — MAGNESIUM: MAGNESIUM: 1.5 mg/dL — ABNORMAL LOW (ref 1.6–2.3)

## 2022-12-26 NOTE — Unmapped (Signed)
Patient called and says that 12/18 would work for him  Please call him back to confirm  He can be reached at [phone]     Thanks  Yehuda Mao

## 2022-12-27 LAB — CMV DNA, QUANTITATIVE, PCR: CMV QUANT: NEGATIVE [IU]/mL

## 2022-12-27 LAB — TACROLIMUS LEVEL: TACROLIMUS BLOOD: 5.5 ng/mL (ref 2.0–20.0)

## 2022-12-28 DIAGNOSIS — N2581 Secondary hyperparathyroidism of renal origin: Principal | ICD-10-CM

## 2023-01-01 LAB — CBC W/ DIFFERENTIAL
BANDED NEUTROPHILS ABSOLUTE COUNT: 0.1 10*3/uL (ref 0.0–0.1)
BASOPHILS ABSOLUTE COUNT: 0 10*3/uL (ref 0.0–0.2)
BASOPHILS RELATIVE PERCENT: 1 %
EOSINOPHILS ABSOLUTE COUNT: 0 10*3/uL (ref 0.0–0.4)
EOSINOPHILS RELATIVE PERCENT: 1 %
HEMATOCRIT: 35.8 % — ABNORMAL LOW (ref 37.5–51.0)
HEMOGLOBIN: 12.1 g/dL — ABNORMAL LOW (ref 13.0–17.7)
IMMATURE GRANULOCYTES: 2 %
LYMPHOCYTES ABSOLUTE COUNT: 0.7 10*3/uL (ref 0.7–3.1)
LYMPHOCYTES RELATIVE PERCENT: 25 %
MEAN CORPUSCULAR HEMOGLOBIN CONC: 33.8 g/dL (ref 31.5–35.7)
MEAN CORPUSCULAR HEMOGLOBIN: 37.3 pg — ABNORMAL HIGH (ref 26.6–33.0)
MEAN CORPUSCULAR VOLUME: 111 fL — ABNORMAL HIGH (ref 79–97)
MONOCYTES ABSOLUTE COUNT: 0.4 10*3/uL (ref 0.1–0.9)
MONOCYTES RELATIVE PERCENT: 14 %
NEUTROPHILS ABSOLUTE COUNT: 1.5 10*3/uL (ref 1.4–7.0)
NEUTROPHILS RELATIVE PERCENT: 57 %
NUCLEATED RED BLOOD CELLS: 1 % — ABNORMAL HIGH (ref 0–0)
PLATELET COUNT: 164 10*3/uL (ref 150–450)
RED BLOOD CELL COUNT: 3.24 x10E6/uL — ABNORMAL LOW (ref 4.14–5.80)
RED CELL DISTRIBUTION WIDTH: 16 % — ABNORMAL HIGH (ref 11.6–15.4)
WHITE BLOOD CELL COUNT: 2.6 10*3/uL — ABNORMAL LOW (ref 3.4–10.8)

## 2023-01-02 LAB — RENAL FUNCTION PANEL
ALBUMIN: 3.9 g/dL — ABNORMAL LOW (ref 4.1–5.1)
BLOOD UREA NITROGEN: 15 mg/dL (ref 6–20)
BUN / CREAT RATIO: 8 — ABNORMAL LOW (ref 9–20)
CALCIUM: 12.3 mg/dL — ABNORMAL HIGH (ref 8.7–10.2)
CHLORIDE: 112 mmol/L — ABNORMAL HIGH (ref 96–106)
CO2: 21 mmol/L (ref 20–29)
CREATININE: 1.85 mg/dL — ABNORMAL HIGH (ref 0.76–1.27)
EGFR: 47 mL/min/{1.73_m2} — ABNORMAL LOW
GLUCOSE: 101 mg/dL — ABNORMAL HIGH (ref 70–99)
PHOSPHORUS, SERUM: 2.5 mg/dL — ABNORMAL LOW (ref 2.8–4.1)
POTASSIUM: 4.9 mmol/L (ref 3.5–5.2)
SODIUM: 142 mmol/L (ref 134–144)

## 2023-01-02 LAB — MAGNESIUM: MAGNESIUM: 1.4 mg/dL — ABNORMAL LOW (ref 1.6–2.3)

## 2023-01-02 LAB — TACROLIMUS LEVEL: TACROLIMUS BLOOD: 6.7 ng/mL (ref 2.0–20.0)

## 2023-01-03 LAB — CMV DNA, QUANTITATIVE, PCR: CMV QUANT: NEGATIVE [IU]/mL

## 2023-01-08 LAB — CBC W/ DIFFERENTIAL
BANDED NEUTROPHILS ABSOLUTE COUNT: 0.1 10*3/uL (ref 0.0–0.1)
BASOPHILS ABSOLUTE COUNT: 0 10*3/uL (ref 0.0–0.2)
BASOPHILS RELATIVE PERCENT: 1 %
EOSINOPHILS ABSOLUTE COUNT: 0.1 10*3/uL (ref 0.0–0.4)
EOSINOPHILS RELATIVE PERCENT: 3 %
HEMATOCRIT: 36.9 % — ABNORMAL LOW (ref 37.5–51.0)
HEMOGLOBIN: 12.8 g/dL — ABNORMAL LOW (ref 13.0–17.7)
IMMATURE GRANULOCYTES: 4 %
LYMPHOCYTES ABSOLUTE COUNT: 0.7 10*3/uL (ref 0.7–3.1)
LYMPHOCYTES RELATIVE PERCENT: 23 %
MEAN CORPUSCULAR HEMOGLOBIN CONC: 34.7 g/dL (ref 31.5–35.7)
MEAN CORPUSCULAR HEMOGLOBIN: 37.4 pg — ABNORMAL HIGH (ref 26.6–33.0)
MEAN CORPUSCULAR VOLUME: 108 fL — ABNORMAL HIGH (ref 79–97)
MONOCYTES ABSOLUTE COUNT: 0.4 10*3/uL (ref 0.1–0.9)
MONOCYTES RELATIVE PERCENT: 13 %
NEUTROPHILS ABSOLUTE COUNT: 1.7 10*3/uL (ref 1.4–7.0)
NEUTROPHILS RELATIVE PERCENT: 56 %
PLATELET COUNT: 162 10*3/uL (ref 150–450)
RED BLOOD CELL COUNT: 3.42 x10E6/uL — ABNORMAL LOW (ref 4.14–5.80)
RED CELL DISTRIBUTION WIDTH: 14.8 % (ref 11.6–15.4)
WHITE BLOOD CELL COUNT: 3 10*3/uL — ABNORMAL LOW (ref 3.4–10.8)

## 2023-01-09 LAB — RENAL FUNCTION PANEL
ALBUMIN: 3.8 g/dL — ABNORMAL LOW (ref 4.1–5.1)
BLOOD UREA NITROGEN: 14 mg/dL (ref 6–20)
BUN / CREAT RATIO: 9 (ref 9–20)
CALCIUM: 12.1 mg/dL — ABNORMAL HIGH (ref 8.7–10.2)
CHLORIDE: 112 mmol/L — ABNORMAL HIGH (ref 96–106)
CO2: 19 mmol/L — ABNORMAL LOW (ref 20–29)
CREATININE: 1.52 mg/dL — ABNORMAL HIGH (ref 0.76–1.27)
EGFR: 59 mL/min/{1.73_m2} — ABNORMAL LOW
GLUCOSE: 94 mg/dL (ref 70–99)
PHOSPHORUS, SERUM: 2.6 mg/dL — ABNORMAL LOW (ref 2.8–4.1)
POTASSIUM: 4.7 mmol/L (ref 3.5–5.2)
SODIUM: 142 mmol/L (ref 134–144)

## 2023-01-09 LAB — TACROLIMUS LEVEL: TACROLIMUS BLOOD: 4.9 ng/mL (ref 2.0–20.0)

## 2023-01-09 LAB — MAGNESIUM: MAGNESIUM: 1.3 mg/dL — ABNORMAL LOW (ref 1.6–2.3)

## 2023-01-10 DIAGNOSIS — Z940 Kidney transplant status: Principal | ICD-10-CM

## 2023-01-10 LAB — CMV DNA, QUANTITATIVE, PCR: CMV QUANT: POSITIVE [IU]/mL

## 2023-01-10 MED ORDER — TACROLIMUS 1 MG CAPSULE, IMMEDIATE-RELEASE
ORAL_CAPSULE | Freq: Two times a day (BID) | ORAL | 2 refills | 15 days | Status: CP
Start: 2023-01-10 — End: 2024-01-10

## 2023-01-10 MED ORDER — TACROLIMUS 5 MG CAPSULE, IMMEDIATE-RELEASE
ORAL_CAPSULE | Freq: Two times a day (BID) | ORAL | 3 refills | 90 days | Status: CP
Start: 2023-01-10 — End: 2024-01-10

## 2023-01-11 MED ORDER — CARVEDILOL 12.5 MG TABLET
ORAL_TABLET | Freq: Two times a day (BID) | ORAL | 11 refills | 30 days | Status: CP
Start: 2023-01-11 — End: 2024-01-11
  Filled 2023-01-23: qty 60, 30d supply, fill #1

## 2023-01-15 LAB — CBC W/ DIFFERENTIAL
BANDED NEUTROPHILS ABSOLUTE COUNT: 0.1 10*3/uL (ref 0.0–0.1)
BASOPHILS ABSOLUTE COUNT: 0 10*3/uL (ref 0.0–0.2)
BASOPHILS RELATIVE PERCENT: 1 %
EOSINOPHILS ABSOLUTE COUNT: 0 10*3/uL (ref 0.0–0.4)
EOSINOPHILS RELATIVE PERCENT: 1 %
HEMATOCRIT: 38.4 % (ref 37.5–51.0)
HEMOGLOBIN: 13.3 g/dL (ref 13.0–17.7)
IMMATURE GRANULOCYTES: 3 %
LYMPHOCYTES ABSOLUTE COUNT: 0.6 10*3/uL — ABNORMAL LOW (ref 0.7–3.1)
LYMPHOCYTES RELATIVE PERCENT: 21 %
MEAN CORPUSCULAR HEMOGLOBIN CONC: 34.6 g/dL (ref 31.5–35.7)
MEAN CORPUSCULAR HEMOGLOBIN: 37.2 pg — ABNORMAL HIGH (ref 26.6–33.0)
MEAN CORPUSCULAR VOLUME: 107 fL — ABNORMAL HIGH (ref 79–97)
MONOCYTES ABSOLUTE COUNT: 0.3 10*3/uL (ref 0.1–0.9)
MONOCYTES RELATIVE PERCENT: 13 %
NEUTROPHILS ABSOLUTE COUNT: 1.7 10*3/uL (ref 1.4–7.0)
NEUTROPHILS RELATIVE PERCENT: 61 %
NUCLEATED RED BLOOD CELLS: 1 % — ABNORMAL HIGH (ref 0–0)
PLATELET COUNT: 160 10*3/uL (ref 150–450)
RED BLOOD CELL COUNT: 3.58 x10E6/uL — ABNORMAL LOW (ref 4.14–5.80)
RED CELL DISTRIBUTION WIDTH: 13.9 % (ref 11.6–15.4)
WHITE BLOOD CELL COUNT: 2.7 10*3/uL — ABNORMAL LOW (ref 3.4–10.8)

## 2023-01-16 LAB — RENAL FUNCTION PANEL
ALBUMIN: 4 g/dL — ABNORMAL LOW (ref 4.1–5.1)
BLOOD UREA NITROGEN: 19 mg/dL (ref 6–20)
BUN / CREAT RATIO: 12 (ref 9–20)
CALCIUM: 12.7 mg/dL — ABNORMAL HIGH (ref 8.7–10.2)
CHLORIDE: 109 mmol/L — ABNORMAL HIGH (ref 96–106)
CO2: 18 mmol/L — ABNORMAL LOW (ref 20–29)
CREATININE: 1.63 mg/dL — ABNORMAL HIGH (ref 0.76–1.27)
EGFR: 55 mL/min/{1.73_m2} — ABNORMAL LOW
GLUCOSE: 89 mg/dL (ref 70–99)
PHOSPHORUS, SERUM: 2.5 mg/dL — ABNORMAL LOW (ref 2.8–4.1)
POTASSIUM: 5 mmol/L (ref 3.5–5.2)
SODIUM: 139 mmol/L (ref 134–144)

## 2023-01-16 LAB — MAGNESIUM: MAGNESIUM: 1.4 mg/dL — ABNORMAL LOW (ref 1.6–2.3)

## 2023-01-16 LAB — TACROLIMUS LEVEL: TACROLIMUS BLOOD: 7.1 ng/mL (ref 2.0–20.0)

## 2023-01-17 LAB — CMV DNA, QUANTITATIVE, PCR: CMV QUANT: NEGATIVE [IU]/mL

## 2023-01-19 DIAGNOSIS — Z940 Kidney transplant status: Principal | ICD-10-CM

## 2023-01-19 NOTE — Unmapped (Signed)
[facility] Specialty and Home Delivery Pharmacy Refill Coordination Note    Specialty Medication(s) to be Shipped:   Infectious Disease: Biktarvy and Transplant: Prednisone 5mg , azathioprine 50 mg tablet (IMURAN), and azathioprine 100 mg tablet (AZASAN)    Other medication(s) to be shipped:  albuterol, carvedilol, and pantoprazole     Christian Brown, DOB: 06-27-1980  Phone: [phone] (home)       All above HIPAA information was verified with patient.     Was a Nurse, learning disability used for this call? No    Completed refill call assessment today to schedule patient's medication shipment from the [facility] and Home Delivery Pharmacy  [phone]).  All relevant notes have been reviewed.     Specialty medication(s) and dose(s) confirmed: Regimen is correct and unchanged.   Changes to medications: Brysen reports no changes at this time.  Changes to insurance: No  New side effects reported not previously addressed with a pharmacist or physician: None reported  Questions for the pharmacist: No    Confirmed patient received a Conservation officer, historic buildings and a Surveyor, mining with first shipment. The patient will receive a drug information handout for each medication shipped and additional FDA Medication Guides as required.       DISEASE/MEDICATION-SPECIFIC INFORMATION        N/A    SPECIALTY MEDICATION ADHERENCE     Medication Adherence    Patient reported X missed doses in the last month: 0  Specialty Medication: azathioprine 100 mg tablet (AZASAN)  Patient is on additional specialty medications: Yes  Additional Specialty Medications: azathioprine 50 mg tablet (IMURAN)  Patient Reported Additional Medication X Missed Doses in the Last Month: 0  Patient is on more than two specialty medications: Yes  Specialty Medication: predniSONE 5 MG tablet (DELTASONE)  Patient Reported Additional Medication X Missed Doses in the Last Month: 0  Specialty Medication: atovaquone 750 mg/5 mL suspension (MEPRON)  Patient Reported Additional Medication X Missed Doses in the Last Month: 0  Specialty Medication: ZARXIO 480 mcg/0.8 mL Syrg (filgrastim-sndz)  Patient Reported Additional Medication X Missed Doses in the Last Month: 0              Were doses missed due to medication being on hold? No     azathioprine 100 mg tablet (AZASAN): unknown days of medicine on hand    azathioprine 50 mg tablet (IMURAN): unknown days of medicine on hand    predniSONE 5 MG tablet (DELTASONE): unknown days of medicine on hand     REFERRAL TO PHARMACIST     Referral to the pharmacist: Not needed      [facility]     Shipping address confirmed in Epic.       Delivery Scheduled: Yes, Expected medication delivery date: 01/24/23.     Medication will be delivered via UPS to the prescription address in Epic WAM.    Craige Cotta   [facility] Special[facility] and Home Delivery Pharm[facility]  Specialty Technician

## 2023-01-19 NOTE — Unmapped (Signed)
Christian Brown has been contacted in regards to their refill of filgrastim-sndz: ZARXIO 480 mcg/0.8 mL Syrg and atovaquone 750 mg/5 mL suspension (MEPRON). At this time, they have declined refill due to  has enough medication on hand . Refill assessment call date has been updated per the patient's request.

## 2023-01-23 DIAGNOSIS — Z940 Kidney transplant status: Principal | ICD-10-CM

## 2023-01-23 DIAGNOSIS — Z79899 Other long term (current) drug therapy: Principal | ICD-10-CM

## 2023-01-23 MED FILL — BIKTARVY 50 MG-200 MG-25 MG TABLET: ORAL | 30 days supply | Qty: 30 | Fill #3

## 2023-01-23 MED FILL — AZATHIOPRINE 100 MG TABLET: ORAL | 30 days supply | Qty: 30 | Fill #2

## 2023-01-23 MED FILL — AZATHIOPRINE 50 MG TABLET: ORAL | 30 days supply | Qty: 30 | Fill #2

## 2023-01-23 MED FILL — PREDNISONE 5 MG TABLET: ORAL | 30 days supply | Qty: 30 | Fill #2

## 2023-01-23 MED FILL — PANTOPRAZOLE 40 MG TABLET,DELAYED RELEASE: ORAL | 90 days supply | Qty: 180 | Fill #1

## 2023-01-23 MED FILL — VENTOLIN HFA 90 MCG/ACTUATION AEROSOL INHALER: RESPIRATORY_TRACT | 17 days supply | Qty: 18 | Fill #2

## 2023-01-24 LAB — CBC W/ DIFFERENTIAL
BANDED NEUTROPHILS ABSOLUTE COUNT: 0 10*3/uL (ref 0.0–0.1)
BASOPHILS ABSOLUTE COUNT: 0 10*3/uL (ref 0.0–0.2)
BASOPHILS RELATIVE PERCENT: 1 %
EOSINOPHILS ABSOLUTE COUNT: 0.1 10*3/uL (ref 0.0–0.4)
EOSINOPHILS RELATIVE PERCENT: 3 %
HEMATOCRIT: 38.6 % (ref 37.5–51.0)
HEMOGLOBIN: 13.3 g/dL (ref 13.0–17.7)
IMMATURE GRANULOCYTES: 2 %
LYMPHOCYTES ABSOLUTE COUNT: 0.6 10*3/uL — ABNORMAL LOW (ref 0.7–3.1)
LYMPHOCYTES RELATIVE PERCENT: 27 %
MEAN CORPUSCULAR HEMOGLOBIN CONC: 34.5 g/dL (ref 31.5–35.7)
MEAN CORPUSCULAR HEMOGLOBIN: 38.2 pg — ABNORMAL HIGH (ref 26.6–33.0)
MEAN CORPUSCULAR VOLUME: 111 fL — ABNORMAL HIGH (ref 79–97)
MONOCYTES ABSOLUTE COUNT: 0.2 10*3/uL (ref 0.1–0.9)
MONOCYTES RELATIVE PERCENT: 12 %
NEUTROPHILS ABSOLUTE COUNT: 1.1 10*3/uL — ABNORMAL LOW (ref 1.4–7.0)
NEUTROPHILS RELATIVE PERCENT: 55 %
NUCLEATED RED BLOOD CELLS: 1 % — ABNORMAL HIGH (ref 0–0)
PLATELET COUNT: 153 10*3/uL (ref 150–450)
RED BLOOD CELL COUNT: 3.48 x10E6/uL — ABNORMAL LOW (ref 4.14–5.80)
RED CELL DISTRIBUTION WIDTH: 13.9 % (ref 11.6–15.4)
WHITE BLOOD CELL COUNT: 2.1 10*3/uL — CL (ref 3.4–10.8)

## 2023-01-24 LAB — RENAL FUNCTION PANEL
ALBUMIN: 4 g/dL — ABNORMAL LOW (ref 4.1–5.1)
BLOOD UREA NITROGEN: 16 mg/dL (ref 6–24)
BUN / CREAT RATIO: 10 (ref 9–20)
CALCIUM: 12.1 mg/dL — ABNORMAL HIGH (ref 8.7–10.2)
CHLORIDE: 110 mmol/L — ABNORMAL HIGH (ref 96–106)
CO2: 20 mmol/L (ref 20–29)
CREATININE: 1.65 mg/dL — ABNORMAL HIGH (ref 0.76–1.27)
EGFR: 54 mL/min/{1.73_m2} — ABNORMAL LOW
GLUCOSE: 105 mg/dL — ABNORMAL HIGH (ref 70–99)
PHOSPHORUS, SERUM: 2.4 mg/dL — ABNORMAL LOW (ref 2.8–4.1)
POTASSIUM: 4.6 mmol/L (ref 3.5–5.2)
SODIUM: 140 mmol/L (ref 134–144)

## 2023-01-24 LAB — MAGNESIUM: MAGNESIUM: 1.3 mg/dL — ABNORMAL LOW (ref 1.6–2.3)

## 2023-01-25 LAB — CMV DNA, QUANTITATIVE, PCR: CMV QUANT: NEGATIVE [IU]/mL

## 2023-01-26 LAB — TACROLIMUS LEVEL: TACROLIMUS BLOOD: 7.1 ng/mL (ref 2.0–20.0)

## 2023-01-29 DIAGNOSIS — Z79899 Other long term (current) drug therapy: Principal | ICD-10-CM

## 2023-01-29 DIAGNOSIS — Z940 Kidney transplant status: Principal | ICD-10-CM

## 2023-01-29 LAB — CBC W/ DIFFERENTIAL
BANDED NEUTROPHILS ABSOLUTE COUNT: 0 10*3/uL (ref 0.0–0.1)
BASOPHILS ABSOLUTE COUNT: 0 10*3/uL (ref 0.0–0.2)
BASOPHILS RELATIVE PERCENT: 1 %
EOSINOPHILS ABSOLUTE COUNT: 0 10*3/uL (ref 0.0–0.4)
EOSINOPHILS RELATIVE PERCENT: 1 %
HEMATOCRIT: 39.8 % (ref 37.5–51.0)
HEMOGLOBIN: 13.6 g/dL (ref 13.0–17.7)
IMMATURE GRANULOCYTES: 2 %
LYMPHOCYTES ABSOLUTE COUNT: 0.3 10*3/uL — ABNORMAL LOW (ref 0.7–3.1)
LYMPHOCYTES RELATIVE PERCENT: 15 %
MEAN CORPUSCULAR HEMOGLOBIN CONC: 34.2 g/dL (ref 31.5–35.7)
MEAN CORPUSCULAR HEMOGLOBIN: 36.4 pg — ABNORMAL HIGH (ref 26.6–33.0)
MEAN CORPUSCULAR VOLUME: 106 fL — ABNORMAL HIGH (ref 79–97)
MONOCYTES ABSOLUTE COUNT: 0.2 10*3/uL (ref 0.1–0.9)
MONOCYTES RELATIVE PERCENT: 14 %
NEUTROPHILS ABSOLUTE COUNT: 1.1 10*3/uL — ABNORMAL LOW (ref 1.4–7.0)
NEUTROPHILS RELATIVE PERCENT: 67 %
PLATELET COUNT: 152 10*3/uL (ref 150–450)
RED BLOOD CELL COUNT: 3.74 x10E6/uL — ABNORMAL LOW (ref 4.14–5.80)
RED CELL DISTRIBUTION WIDTH: 13.1 % (ref 11.6–15.4)
WHITE BLOOD CELL COUNT: 1.7 10*3/uL — CL (ref 3.4–10.8)

## 2023-01-30 DIAGNOSIS — Z940 Kidney transplant status: Principal | ICD-10-CM

## 2023-01-30 LAB — URINALYSIS WITH MICROSCOPY
BILIRUBIN UA: NEGATIVE
BLOOD UA: NEGATIVE
GLUCOSE UA: NEGATIVE
KETONES UA: NEGATIVE
LEUKOCYTE ESTERASE UA: NEGATIVE
NITRITE UA: NEGATIVE
PH UA: 7 (ref 5.0–7.5)
SPECIFIC GRAVITY UA: 1.017 (ref 1.005–1.030)
UROBILINOGEN UA: 1 mg/dL (ref 0.2–1.0)

## 2023-01-30 LAB — RENAL FUNCTION PANEL
ALBUMIN: 3.9 g/dL — ABNORMAL LOW (ref 4.1–5.1)
BLOOD UREA NITROGEN: 14 mg/dL (ref 6–24)
BUN / CREAT RATIO: 8 — ABNORMAL LOW (ref 9–20)
CALCIUM: 12.1 mg/dL — ABNORMAL HIGH (ref 8.7–10.2)
CHLORIDE: 109 mmol/L — ABNORMAL HIGH (ref 96–106)
CO2: 19 mmol/L — ABNORMAL LOW (ref 20–29)
CREATININE: 1.7 mg/dL — ABNORMAL HIGH (ref 0.76–1.27)
EGFR: 52 mL/min/{1.73_m2} — ABNORMAL LOW
GLUCOSE: 93 mg/dL (ref 70–99)
PHOSPHORUS, SERUM: 2.7 mg/dL — ABNORMAL LOW (ref 2.8–4.1)
POTASSIUM: 4.7 mmol/L (ref 3.5–5.2)
SODIUM: 140 mmol/L (ref 134–144)

## 2023-01-30 LAB — MICROSCOPIC EXAMINATION
BACTERIA: NONE SEEN
CASTS: NONE SEEN /LPF
EPITHELIAL CELLS (NON RENAL): NONE SEEN /HPF (ref 0–10)

## 2023-01-30 LAB — MAGNESIUM: MAGNESIUM: 1.3 mg/dL — ABNORMAL LOW (ref 1.6–2.3)

## 2023-01-30 NOTE — Unmapped (Signed)
Called and spoke with pt.  Pt denies taking coumadin.  States was on Xarelto 'a long time ago and was discontinued.'

## 2023-01-31 LAB — CMV DNA, QUANTITATIVE, PCR: CMV QUANT: POSITIVE [IU]/mL

## 2023-01-31 LAB — TACROLIMUS LEVEL: TACROLIMUS BLOOD: 7.4 ng/mL (ref 2.0–20.0)

## 2023-02-02 ENCOUNTER — Ambulatory Visit: Admit: 2023-02-02 | Discharge: 2023-02-03 | Payer: MEDICARE

## 2023-02-02 ENCOUNTER — Institutional Professional Consult (permissible substitution): Admit: 2023-02-02 | Discharge: 2023-02-03 | Payer: MEDICARE

## 2023-02-02 DIAGNOSIS — Z940 Kidney transplant status: Principal | ICD-10-CM

## 2023-02-02 DIAGNOSIS — Z79899 Other long term (current) drug therapy: Principal | ICD-10-CM

## 2023-02-02 LAB — RENAL FUNCTION PANEL
ALBUMIN: 3.3 g/dL — ABNORMAL LOW (ref 3.4–5.0)
ANION GAP: 8 mmol/L (ref 5–14)
BLOOD UREA NITROGEN: 16 mg/dL (ref 9–23)
BUN / CREAT RATIO: 9
CALCIUM: 12.3 mg/dL (ref 8.7–10.4)
CHLORIDE: 108 mmol/L — ABNORMAL HIGH (ref 98–107)
CO2: 23.1 mmol/L (ref 20.0–31.0)
CREATININE: 1.69 mg/dL — ABNORMAL HIGH (ref 0.73–1.18)
EGFR CKD-EPI (2021) MALE: 52 mL/min/{1.73_m2} — ABNORMAL LOW (ref >=60)
GLUCOSE RANDOM: 110 mg/dL (ref 70–179)
PHOSPHORUS: 2.1 mg/dL — ABNORMAL LOW (ref 2.4–5.1)
POTASSIUM: 4.5 mmol/L (ref 3.4–4.8)
SODIUM: 139 mmol/L (ref 135–145)

## 2023-02-02 LAB — CBC W/ AUTO DIFF
BASOPHILS ABSOLUTE COUNT: 0 10*9/L (ref 0.0–0.1)
BASOPHILS RELATIVE PERCENT: 0.9 %
EOSINOPHILS ABSOLUTE COUNT: 0.1 10*9/L (ref 0.0–0.5)
EOSINOPHILS RELATIVE PERCENT: 4 %
HEMATOCRIT: 39.8 % (ref 39.0–48.0)
HEMOGLOBIN: 13.6 g/dL (ref 12.9–16.5)
LYMPHOCYTES ABSOLUTE COUNT: 0.6 10*9/L — ABNORMAL LOW (ref 1.1–3.6)
LYMPHOCYTES RELATIVE PERCENT: 25.9 %
MEAN CORPUSCULAR HEMOGLOBIN CONC: 34.3 g/dL (ref 32.0–36.0)
MEAN CORPUSCULAR HEMOGLOBIN: 37.8 pg — ABNORMAL HIGH (ref 25.9–32.4)
MEAN CORPUSCULAR VOLUME: 110.4 fL — ABNORMAL HIGH (ref 77.6–95.7)
MEAN PLATELET VOLUME: 9.1 fL (ref 6.8–10.7)
MONOCYTES ABSOLUTE COUNT: 0.2 10*9/L — ABNORMAL LOW (ref 0.3–0.8)
MONOCYTES RELATIVE PERCENT: 11.5 %
NEUTROPHILS ABSOLUTE COUNT: 1.2 10*9/L — ABNORMAL LOW (ref 1.8–7.8)
NEUTROPHILS RELATIVE PERCENT: 57.7 %
PLATELET COUNT: 151 10*9/L (ref 150–450)
RED BLOOD CELL COUNT: 3.61 10*12/L — ABNORMAL LOW (ref 4.26–5.60)
RED CELL DISTRIBUTION WIDTH: 14.6 % (ref 12.2–15.2)
WBC ADJUSTED: 2.1 10*9/L — ABNORMAL LOW (ref 3.6–11.2)

## 2023-02-02 LAB — MAGNESIUM: MAGNESIUM: 1.4 mg/dL — ABNORMAL LOW (ref 1.6–2.6)

## 2023-02-02 LAB — SLIDE REVIEW

## 2023-02-02 NOTE — Unmapped (Addendum)
Labs to every 2 weeks for now. Increased frequency after parathyroid surgery.    For AFTER your parathyroid surgery: you will be at risk early on for low calcium. Symptoms of low calcium are numbness around the lips and twitching in the face. If you experience these symptpoms chew 4 tums immediately and call the on call transplant coordinator.    We will stay in close touch in the weeks after parathyroid surgery.      [facility] scheduling:  [phone]

## 2023-02-02 NOTE — Unmapped (Unsigned)
[facility] CLINIC PHARMACY NOTE  [name]  [id number]    Medication changes today:   1. Aranesp 100 mcg given in clinic today  2. V fluids given in clinic  3. Start dapsone 100 mg daily  4. Start Valcyte 450 mg daily  5. Start cetirizine 10 mg daily PRN for itching    Education/Adherence tools provided today:  - Provided updated medication list  - Provided additional education on immunosuppression and transplant related medications including reviewing indications of medications, dosing and side effects  - Discussed adherence reminder tools such as cell phone alarms    Follow up items:  1. Goal of understanding indications and dosing of immunosuppression medications  2. CD4/HIV viral load  3. Calcium level  4. BK virus pending  5. Hemoglobin     Next visit with pharmacy in 1-2 weeks  ____________________________________________________________________    Christian Brown is a 42 y.o. 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 POD3 transferred to SICU for hypertensive emergency and placed on nicardipine gtt. Transferred back to floor on POD6 and developed gastritis and esophagitis.   Readmitted to [facility] 09/27/22-09/29/22 for incisional pain and drainage. Found to be hyperkalemic (5.8) and treated with Lokelma and insulin.    Rejection History: NTD  Infection History: NTD  ___________________________________________________________________    Last seen by pharmacy 4 months ago    Interval History: N/A    Seen by pharmacy today for: medication management and pill box fill and adherence education    CC:  Patient complains of  itching and pain around incision site    General: Fatigue  Neuro: Tremors  CV: No issues  Resp: No issues  GI: Nausea  GU: No issues  Derm: Itching and Rash  Psych: No issues.    Fluid Status:   Edema no, SOB no  Intake: ~2 L   Output: ~2.2 L   Plan: Continue to monitor      There were no vitals filed for this visit.    ___________________________________________________________________    Allergies   Allergen Reactions    Amlodipine Hives, Palpitations, Rash and Other (See Comments)     Causes fatigue      Doxercalciferol Hives    Fish Containing Products Hives and Shortness Of Breath     Please do not serve fish for meals. Highly allergic         Iron Sucrose Hives    Labetalol Itching and Rash    Metoclopramide Hives and Other (See Comments)     Causes ticks/ jerks    Metoprolol Hives    Nifedipine Hives, Itching and Shortness Of Breath    Other Swelling     Suture material, localized swelling at closure site    Sulfamethoxazole-Trimethoprim Rash and Other (See Comments)     Renal failure      Vancomycin Hives and Other (See Comments)     Patient states vancomycin caused kidney injury    Calcitonin     Peanut Oil Other (See Comments)    Potassium Clavulanate     Azathioprine Itching and Rash  Clindamycin Other (See Comments) and Palpitations     Chest pain    Hydralazine Itching and Dizziness           Sensipar [Cinacalcet] Hives       Medications reviewed in EPIC medication station and updated today by the clinical pharmacist practitioner.    Current Outpatient Medications   Medication Instructions    acetaminophen (TYLENOL) 650 mg, Oral, Every 6 hours PRN    albuterol HFA 90 mcg/actuation inhaler Inhale 1-2 puffs every four (4) to six (6) hours as needed for wheezing or shortness of breath.    azathioprine (AZASAN) 100 mg, Oral, Daily (standard), Take in addition to 50 mg tablet for a total daily dose of 150 mg daily    azathioprine (IMURAN) 50 mg, Oral, Daily (standard), Take in addition to 100 mg tablet for a total daily dose to of 150 mg daily    bictegrav-emtricit-tenofov ala (BIKTARVY) 50-200-25 mg tablet 1 tablet, Oral, Every morning    calamine-zinc oxide 8-8 % Lotn Topical, 4 times a day    carvedilol (COREG) 12.5 mg, Oral, 2 times a day with meals    carvedilol (COREG) 12.5 mg, Oral, 2 times a day (standard)    DIETARY SUPPLEMENT,MISC COMB14 ORAL Taking magnesium gummy supplement once daily. Dosage strength & product unknown     diphenhydrAMINE (BENADRYL) 25 mg, Oral, Every 6 hours PRN    empty container Misc Use as directed with Zarxio    hydrocortisone 1 % cream Topical, 2 times a day (standard)    pantoprazole (PROTONIX) 40 mg, Oral, 2 times a day (standard)    predniSONE (DELTASONE) 5 mg, Oral, Daily    tacrolimus (PROGRAF) 10 mg, Oral, 2 times a day, Take in addition to 1 mg tablets for a total dose of 12 mg twice daily.    tacrolimus (PROGRAF) 2 mg, Oral, 2 times a day, Take in addition to 5 mg capsules for a total daily dose of 12 mg bid.    ZARXIO 480 mcg, Subcutaneous, Weekly, Administer only as directed by transplant team.       GRAFT FUNCTION: stable  Lab Results   Component Value Date    Creatinine 1.70 (H) 01/29/2023    Creatinine 1.65 (H) 01/23/2023    Creatinine 1.63 (H) 01/15/2023    Creatinine 1.52 (H) 01/08/2023       Baseline Scr: TBD  Scr nadir: TBD  CrCl cannot be calculated (Unknown ideal weight.).    Proteinuria/UPC: Yes: below.  Lab Results   Component Value Date    Protein/Creatinine Ratio, Urine 0.332 12/14/2022    Protein/Creatinine Ratio, Urine 0.289 11/02/2022    Protein/Creatinine Ratio, Urine 0.288 10/06/2022    Protein/Creatinine Ratio, Urine 0.356 09/17/2022       DSA: ntd  Lab Results   Component Value Date    DSA Comment  12/14/2022      Comment:      The HLA antigens listed are donor antigens and the corresponding MFI value.  MFI values >= 1000 are considered positive.  A negative result does not exclude the presence of low level DSA.  Blank donor antigen and MFI fields indicate unavailable donor HLA   type or lack of appropriate single antigen bead to determine DSA.  As such, the presence or absence of DSA to the locus cannot be confirmed.     Donor specific antibody (DSA) testing is performed with a  solid phase multiplex single antigen bead array method.  All procedures and reagents  have been validated and performance characteristics determined by the [facility].    Certain of these tests have not been cleared/approved by the U.S. Food and Drug Administration (FDA).  The FDA has determined that such approval/clearance is not necessary because this laboratory is certified under the Clinical Laboratory Improvements   Amendments to perform high complexity testing.  This test is used for clinical purposes.  It should not be regarded as investigational or for research.  All HLA typings are performed using molecular methodologies.  HLA-A, B, C, DR, and DQ results are   serological equivalents of the molecular type.           Zero hour biopsy: Not preformed  Biopsies to date: NTD      CURRENT IMMUNOSUPPRESSION:    Tacrolimus Goal: 8-11 (HIV+)  Tacrolimus (Prograf) 5 mg BID  Azathioprine 150 mg daily  Prednisone 5 mg daily  *converted to aza from MMF due to esophagitis/gastritis found on EGD 09/12/22    IMMUNOSUPPRESSION DRUG LEVELS:  Lab Results   Component Value Date    Tacrolimus, Trough 4.6 (L) 11/22/2022    Tacrolimus, Trough 8.1 10/27/2022    Tacrolimus, Trough 8.0 09/28/2022    Tacrolimus Lvl 7.4 01/29/2023    Tacrolimus Lvl 7.1 01/23/2023    Tacrolimus Lvl 7.1 01/15/2023     No results found for: CYCLO  No results found for: EVEROLIMUS  No results found for: SIROLIMUS    Prograf level not drawn today due. Would not be an accurate trough    Patient complains of tremor    WBC/ANC:   low  Lab Results   Component Value Date    WBC 1.7 (LL) 01/29/2023       Plan: Will maintain current immunosuppression Consider retrial of mycophenolate (Myfortic) in the future pending repeat EGD. Continue to monitor.      OI Prophylaxis:   CMV Status: D-/ R+, moderate risk.   CMV prophylaxis: valganciclovir 450 mg daily x 3 months per protocol. (Not taking, patient prefers preemptive monitoring)  CMV Prophylaxis End Date: 12/06/22  Lab Results   Component Value Date    CMV Quant Positive < 200 01/29/2023    CMV Quant Negative 01/23/2023    CMV Quant Negative 01/15/2023    CMV Quant Positive < 200 01/08/2023    CMV Quant Negative 01/01/2023    CMV Quant Negative 12/25/2022       PCP Prophylaxis: dapsone 100 mg daily x 6 months. (Start 10/05/22)  PCP Prophylaxis End Date: 09/05/23  G6PD 12.3 on 09/15/22, patient reports tolerating dapsone in the past   *Received inhaled pentamidine x1 on 09/07/22 (bronchospasms given albuterol/IV Benadryl), hx of Bactrim allergy (vomiting on treatment dose Bactrim for PJP)  Patient refusing atovaquone at this time.    Thrush: completed in hospital    Patient is  off prophylaxis    Plan:  Start dapsone 100 mg daily, start Valcyte 450 mg daily. Patient is amenable to restart CMV prophylaxis outpatient . If doesn't tolerate dapsone may consider retrial of Bactrim.  Continue to monitor.      CV Prophylaxis:  aspirin 81 mg (refused during initial discharge)  The 10-year ASCVD risk score (Arnett DK, et al., 2019) is: 0.6%  No results found for: LIPID    Statin therapy: Not indicated; currently on none  Plan:  Recommend updated lipid panel post transplant . Continue to monitor     BP: Goal < 140/90. Clinic vitals reported above  Home BP ranges: 130s/70s-80s  Current meds include: carvedilol 25 mg BID, minoxidil 5 mg BID  *allergies to hydralazine, amlodipine, nifedipine, labetalol, metoprolol  Plan: within goal May consider ARB in the future if needed. Continue to monitor    Anemia of CKD:  H/H:   Lab Results   Component Value Date    HGB 13.6 01/29/2023     Lab Results   Component Value Date    HCT 39.8 01/29/2023     Iron panel:  Lab Results   Component Value Date    IRON 106 12/14/2022    TIBC 279 12/14/2022    FERRITIN 204.8 12/14/2022     Lab Results   Component Value Date    Iron Saturation (%) 38 12/14/2022       Prior ESA use: Aranesp therapy plan entered    Plan: out of goal. Start Aranesp 100 mcg x 1 in clinic today. Continue to monitor.     DM:   Lab Results   Component Value Date    A1C 4.5 (L) 12/14/2022   . Goal A1c < 7  History of Dm? No  Established with endocrinologist/PCP for BG managment? No  Currently on: None    Diet: Not assessed  Exercise: Not yet  Hypoglycemia: no  Plan: No change.   Continue to monitor      HIV: Diagnosed in 2015; pre-ART VL 98,300 (10/2013). VL last checked 09/05/22: <20  Current meds: Biktarvy 50-200-25 mg daily  Plan: Monitor CD4/HIV viral load every 60-90 days for 12 months post transplant.  Has ICID follow up 9/5.    Electrolytes: Mag is low, K elevated  Lab Results   Component Value Date    Potassium 4.7 01/29/2023    Sodium 140 01/29/2023    Magnesium 1.3 (L) 01/29/2023    CO2 19 (L) 01/29/2023    Phosphorus 1.8 (L) 12/14/2022       Meds currently on: sodium bicarb 650 mg BID, magnesium plus 133 mg BID (not taking)  Plan: No change.  Continue to monitor     GI/BM: pt reports 1-2BM  Meds currently on: docusate PRN (not taking), pantoprazole 40 mg BID, prochlorperazine 5 mg q8h prn (not taking)  Plan: No change.  Continue BID PPI x8 weeks with repeat EGD per GI. Continue to monitor    Pain: pt reports mild pain  Meds currently on: oxycodone 5 mg Q4H PRN (taking nightly), APAP PRN (not taking  Plan: No change.  Continue to monitor    Bone health:   Vitamin D Level: last level is wnl . Goal > 30.   Lab Results   Component Value Date    Vitamin D Total (25OH) 19.1 (L) 12/14/2022       Lab Results   Component Value Date    Calcium 12.1 (H) 01/29/2023    Calcium 12.1 (H) 01/23/2023       Last DEXA results:  none available  Current meds include: none *allergic to calcitonin and cincalcet  Plan: Vitamin D level  within goal,not on supplementation at this time. Continue to monitor.    Women's/Men's Health:  Delores Alder is a 42 y.o. male. Patient reports no men's/women's health issues  Plan: Continue to monitor    Immunizations:  Influenza [Annual]: Received 12/09/21    PCV13: Received 12/12/19  PPSV23: Received 11/26/18  PCV20: Never received    Shingrix Zoster [2 doses, 2 - 6 months apart]: Never received    COVID-19 [3 primary doses, 2 boosters]: 1st dose given 09/28/19,  2nd dose given 10/26/19, and Booster given 10/10/21    Immunization status: up to date and documented.    Pharmacy preference:  [facility]  Medication Refills:  N/A  Medication Access:  N/A    Adherence: Patient has average understanding of medications; was able to independently identify names/doses of immunosuppressants and OI meds.  Patient  does not fill their own pill box on a regular basis at home. Not using a pill box at home.  Patient brought medication card:yes  Pill box:did not bring  Plan: Discussed use of pill box at home, at this time patient prefers to not use a pill box; provided moderate adherence counseling/intervention    Patient was reviewed with [name] who was agreement with the stated plan:     During this visit, the following was completed:   BG log data assessment  BP log data assessment  Labs ordered and evaluated  complex treatment plan >1 DS   I spent a total of 20 minutes face to face with the patient delivering clinical care and providing education/counseling.    All questions/concerns were addressed to the patient's satisfaction.  __________________________________________  Hazeline Junker, PharmD, CPP  Abdominal Transplant Clinical Pharmacist Practitioner

## 2023-02-02 NOTE — Unmapped (Signed)
[facility]   TRANSPLANT FOLLOW UP     PCP: Belva Bertin, RTL   Kidney transplant coordinator: Celene Squibb    Date of Visit at Transplant clinic: 02/02/2023     Assessment/Recommendations:     # s/p deceased donor kidney transplant 09/05/2022   Native kidney disease collapsing FSGS/HIVAN  Graft function: creatinine 1.7-2.1  DSAs: Had a Class 2 DSA (DQ) but MFI improved  Biomarkers: plan for Allosure q3 months (drawn 12/6) due to high risk for rejection and modified immunosuppression related to medication intolerances and cytopenias  Post-surgical issues:  - aspirin for 1 year post-op    # Immunosuppression  Tacrolimus (Prograf) 10 mg BID, goal trough 6-8 ng/mL   - the level 12/2 was a 13.5 hr level  Azathioprine 150 mg daily, goal to boost to 200 mg in future when cytopenias improve  Prednisone 5 mg daily (indicated due to HIVAN)  Initially MMF but did not tolerate due to gastritis; he may benefit from trial of Myfortic in future due to his elevated rejection risk    # Acute issues today  Had a URI (cough, phlegm) earlier this week which resolved      Hx gastritis+PUD, tx PPI, is due for followup endoscopy, not yet scheduled    # BP management   Goal 130/80, as tolerated.  - carvedilol to 12.5 mg BID  Intolerances to amlodipine/nifedipine and hydralazine, hives to metoprolol and labetalol.    # Infectious disease  CMV: D-/R+ EBV: D+/R+   CMV ppx:  Patient chose to do preemptive monitoring, never took Valcyte. Most recent CMV 12/2 was positive <200 and this is the first detectable; will monitor  PJP ppx: Strongly recommend atovoquone, he his high risk for PJP.   Bactrim allergy. Had acute resp distress to pentamadine. Had cytopenias to dapsone.  Follows with ID for his HIV, on Biktarvy    # Heme  - Aranesp resolved  - ANC improved to 1.2    # Cardiovascular: primary prevention  The 10-year ASCVD risk score (Arnett DK, et al., 2019) is: 0.6%    # CKD-BMD  # Hyperparathyroidism s/p parathyroidetomy April 2021, with recurrent hyperparathyroidism   Hypercalcemia: drinking 2L fluid daily. Allergic to cinacaclet and there is not a good alternative.  - parathyroidectomy schedouled with Dr. Selena Batten on 03/14/23    # Electrolytes  Mg: taking Mg gummy OTC  K acceptable, slightly high but safe range    # Immunizations  Immunization History   Administered Date(s) Administered    COVID-19 VAC,BIVALENT,MODERNA(BLUE CAP) 10/10/2021    COVID-19 VACCINE,MRNA(MODERNA)(PF) 09/28/2019, 10/26/2019    HEPATITIS B VACCINE ADULT, ADJUVANTED, IM(HEPLISAV B) 01/16/2020, 03/18/2020, 05/12/2020    HEPATITIS B VACCINE ADULT,IM(ENERGIX B, RECOMBIVAX) 03/30/2015, 01/27/2016    Hepatitis A (Adult) 09/04/2016, 04/26/2017, 10/10/2021    Hepatitis A Vaccine - Unspecified Formulation 09/04/2016, 04/26/2017    Hepatitis B Vaccine, Unspecified Formulation 12/04/1994, 01/09/1995, 06/12/1995    INFLUENZA TIV (TRI) 60MO+ W/ PRESERV (IM) 11/20/2013, 11/11/2014, 03/15/2016, 11/27/2017, 12/11/2018    INFLUENZA TIV (TRI) PF (IM)(HISTORICAL) 03/15/2016    INFLUENZA VACCINE IIV3(IM)(PF)6 MOS UP 12/14/2022    Influenza Vaccine PF(Quad)(Egg Free)18+(Flublok) 12/09/2021    Influenza Vaccine Quad (IIV4 W/PRESERV) 60MO+ 11/01/2016, 11/26/2018    Influenza Vaccine Quad(IM)6 MO-Adult(PF) 11/20/2013, 11/11/2014, 11/29/2020    Influenza Virus Vaccine, unspecified formulation 11/27/2017, 12/11/2018, 12/12/2019, 11/27/2020    MENINGOCOCCAL VACCINE, A,C,Y, W-135(IM)(MENVEO) 07/10/2016, 03/22/2017    MMR 01/09/1995    Meningococcal Conjugate MCV4P 07/10/2016, 03/22/2017    PNEUMOCOCCAL  POLYSACCHARIDE 23-VALENT 11/20/2013, 11/20/2013, 11/26/2018, 11/26/2018    Pneumococcal Conjugate 13-Valent 08/28/2017, 08/28/2017, 12/12/2019    TD(TDVAX),ADSORBED,2LF(IM)(PF) 06/12/1995    TdaP 03/08/2018, 03/08/2018       # Cancer screening  Colonoscopy: start age 89  Skin: recommend yearly dermatology evaluation    # Follow up:  Labs weekly  Visits RTC 2 months      Kidney Transplant History:   Date of Transplant: 09/05/2022 (Kidney)  Type of Transplant: DDKT  KDPI: 18%  Cold ischemic time: 972 minutes  Warm ischemic time: 40 minutes  cPRA: 0-7%  HLA match: 4/6  Blood type: Donor O, Recipient O POS  ID: CMV: D-/R+ EBV: D+/R+, HCV donor serologies not immediately available (requesting this info)  Native Kidney Disease: collapsing FSGS/HIVAN (biopsy proven)    Native kidney biopsy: yes   Pre-transplant dialysis course: hemodialysis since 02/11/2015   Post-Transplant Course:    Delayed graft function requiring dialysis: no   Other complications: readmitted 7/31-8/2 for nausea/vomiting  Prior Transplants: None  Induction: thymoglobulin  Early steroid withdrawal: No (due to HIVAN)    Biopsies:   Zero-Hour Biopsy: none    History of Presenting Illness:     Since the last visit:  - home BP 120-125/80-85  - he is back to work since Nov 11    Concerns about nonadherence: No    Social: lives in [place]. Staying with his mother in [place] during recovery. 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     Review of Systems:   A 12-system review was negative except as documented in the HPI.    Physical Exam:     There were no vitals taken for this visit.  Constitutional:  Well-appearing in NAD  Eyes:  anicteric sclerae  ENT:  MMM  CV:  RRR, no m/r/g, no JVD, extremities WWP with no edema  Resp:  Good air movement, CTAB  GI:  Abdomen soft, NTND, +bs  MSK:  Grossly normal, exam is limited  Skin:  Normal turgor, no rash  Neuro:  Grossly normal, exam is limited  Psych:  Normal affect  Dialysis access: LUE AVF forearm      Allergies:   Allergies   Allergen Reactions    Amlodipine Hives, Palpitations, Rash and Other (See Comments)     Causes fatigue      Doxercalciferol Hives    Fish Containing Products Hives and Shortness Of Breath     Please do not serve fish for meals. Highly allergic         Iron Sucrose Hives    Labetalol Itching and Rash    Metoclopramide Hives and Other (See Comments)     Causes ticks/ jerks    Metoprolol Hives    Nifedipine Hives, Itching and Shortness Of Breath    Other Swelling     Suture material, localized swelling at closure site    Sulfamethoxazole-Trimethoprim Rash and Other (See Comments)     Renal failure      Vancomycin Hives and Other (See Comments)     Patient states vancomycin caused kidney injury    Calcitonin     Peanut Oil Other (See Comments)    Potassium Clavulanate     Azathioprine Itching and Rash    Clindamycin Other (See Comments) and Palpitations     Chest pain    Hydralazine Itching and Dizziness           Sensipar [Cinacalcet] Hives        Current  Medications:   Current Outpatient Medications   Medication Sig Dispense Refill    acetaminophen (TYLENOL) 160 mg/5 mL solution Take 20.3 mL (650 mg total) by mouth every six (6) hours as needed for fever or pain. 473 mL 0    albuterol HFA 90 mcg/actuation inhaler Inhale 1-2 puffs every four (4) to six (6) hours as needed for wheezing or shortness of breath. 18 g 11    azathioprine (AZASAN) 100 mg tablet Take 1 tablet (100 mg total) by mouth daily. Take in addition to 50 mg tablet for a total daily dose of 150 mg daily 90 tablet 3    azathioprine (IMURAN) 50 mg tablet Take 1 tablet (50 mg total) by mouth daily. Take in addition to 100 mg tablet for a total daily dose to of 150 mg daily 30 tablet 11    bictegrav-emtricit-tenofov ala (BIKTARVY) 50-200-25 mg tablet Take 1 tablet by mouth every morning. 30 tablet 11    calamine-zinc oxide 8-8 % Lotn Apply topically four (4) times a day. 177 mL 0    carvedilol (COREG) 12.5 MG tablet Take 1 tablet (12.5 mg total) by mouth in the morning and 1 tablet (12.5 mg total) in the evening. Take with meals. 60 tablet 11    carvedilol (COREG) 12.5 MG tablet Take 1 tablet (12.5 mg total) by mouth two (2) times a day. 60 tablet 11    DIETARY SUPPLEMENT,MISC COMB14 ORAL Taking magnesium gummy supplement once daily. Dosage strength & product unknown diphenhydrAMINE (BENADRYL) 25 mg capsule Take 1 capsule (25 mg total) by mouth every six (6) hours as needed for itching. 96 capsule 0    empty container Misc Use as directed with Zarxio 1 each 0    filgrastim-sndz (ZARXIO) 480 mcg/0.8 mL Syrg Inject 0.8 mL (480 mcg total) under the skin once a week. Administer only as directed by transplant team. 3.2 mL 1    hydrocortisone 1 % cream Apply topically two (2) times a day. (Patient not taking: Reported on 10/27/2022) 30 g 11    pantoprazole (PROTONIX) 40 MG tablet Take 1 tablet (40 mg total) by mouth two (2) times a day. 180 tablet 3    predniSONE (DELTASONE) 5 MG tablet Take 1 tablet (5 mg total) by mouth in the morning. 90 tablet 3    tacrolimus (PROGRAF) 1 MG capsule Take 2 capsules (2 mg total) by mouth two (2) times a day. Take in addition to 5 mg capsules for a total daily dose of 12 mg bid. 60 capsule 2    tacrolimus (PROGRAF) 5 MG capsule Take 2 capsules (10 mg total) by mouth two (2) times a day. Take in addition to 1 mg tablets for a total dose of 12 mg twice daily. 360 capsule 3     Current Facility-Administered Medications   Medication Dose Route Frequency Provider Last Rate Last Admin    sodium chloride 0.9 % (NS) bag 1,000 mL  1,000 mL Intravenous Once Leafy Half, MD   Stopped at 10/06/22 1550       Past Medical History:   Past Medical History:   Diagnosis Date    Acquired immunodeficiency syndrome (CMS-HCC) 11/14/2013    Formatting of this note might be different from the original.   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   He appears to be doing well. States he is back to  90% except for his leg swelling.    Will change his epivir to 150 mg daily when he completes his liquid rx. Will see him back in 1 month and repeat his labs th    Acute deep vein thrombosis (DVT) of popliteal vein of left lower extremity (CMS-HCC) 02/05/2019    Last Assessment & Plan: Formatting of this note might be different from the original.  Moderate effusion left knee new the some limitation of motion.  No evidence of sepsis the white count the normal  will     Proceed on with an aspiration for cell count, culture and crystal analysis.  New x-rays ordered and currently pending will follow.  Further therapeutic recommendations contingent on the re    Anemia     Asthma     Candidal esophagitis (CMS-HCC) 07/30/2022    Community acquired pneumonia 07/30/2014    DVT (deep venous thrombosis) (CMS-HCC) 11/24/2013    Last Assessment & Plan:    Formatting of this note might be different from the original.   Appears to be doing better (swelling improved). He will f/u with PCP regarding coumadin dosing (INR is low).  Formatting of this note might be different from the original.   Last Assessment & Plan:    Formatting of this note might be different from the original.   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   greatly appreciate renal f/u.  Formatting of this note might be different from the original.   Last Assessment & Plan:    greatly appreciate renal f/u.   Last Assessment & Plan:    greatly appreciate renal f/u.  Formatting of this note might be different from the original.   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        Electronically signed by:   Leafy Half, MD  [facility]

## 2023-02-03 LAB — TACROLIMUS LEVEL, TROUGH: TACROLIMUS, TROUGH: 5.5 ng/mL (ref 5.0–15.0)

## 2023-02-05 DIAGNOSIS — Z940 Kidney transplant status: Principal | ICD-10-CM

## 2023-02-05 DIAGNOSIS — Z79899 Other long term (current) drug therapy: Principal | ICD-10-CM

## 2023-02-05 LAB — ALLOSURE KIDNEY: ALLOSURE: 0.19 %

## 2023-02-05 MED ORDER — TACROLIMUS 5 MG CAPSULE, IMMEDIATE-RELEASE
ORAL_CAPSULE | Freq: Two times a day (BID) | ORAL | 3 refills | 90.00 days | Status: CP
Start: 2023-02-05 — End: 2024-02-05
  Filled 2023-02-06: qty 120, 30d supply, fill #0

## 2023-02-05 NOTE — Unmapped (Signed)
[facility] Delivery Pharm[facility] Refill Coordination Note    Specialty Medication(s) to be Shipped:   Transplant: tacrolimus 5mg     Other medication(s) to be shipped: No additional medications requested for fill at this time     Christian Brown, DOB: November 13, 1980  Phone: [phone] (home)       All above HIPAA information was verified with patient.     Was a Nurse, learning disability used for this call? No    Completed refill call assessment today to schedule patient's medication shipment from the [facility] and [facility] Delivery Pharm[facility]  [phone]).  All relevant notes have been reviewed.     Specialty medication(s) and dose(s) confirmed: Regimen is correct and unchanged.   Changes to medications: Devinn reports no changes at this time.  Changes to insurance: No  New side effects reported not previously addressed with a pharmacist or physician: None reported  Questions for the pharmacist: No    Confirmed patient received a Conservation officer, historic buildings and a Surveyor, mining with first shipment. The patient will receive a drug information handout for each medication shipped and additional FDA Medication Guides as required.       DISEASE/MEDICATION-SPECIFIC INFORMATION        N/A    SPECIALTY MEDICATION ADHERENCE     Medication Adherence    Patient reported X missed doses in the last month: 0  Specialty Medication: tacrolimus 5 MG capsule (PROGRAF)  Patient is on additional specialty medications: No              Were doses missed due to medication being on hold? No     tacrolimus 5 MG capsule (PROGRAF): 0 days of medicine on hand       REFERRAL TO PHARMACIST     Referral to the pharmacist: Not needed      [facility]     Shipping address confirmed in Epic.       Delivery Scheduled: Yes, Expected medication delivery date: 12/11.  However, Rx request for refills was sent to the provider as there are none remaining.     Medication will be delivered via UPS to the prescription address in Epic WAM.    [facility]  Specialty Technician

## 2023-02-12 DIAGNOSIS — Z79899 Other long term (current) drug therapy: Principal | ICD-10-CM

## 2023-02-12 DIAGNOSIS — Z940 Kidney transplant status: Principal | ICD-10-CM

## 2023-02-15 DIAGNOSIS — Z940 Kidney transplant status: Principal | ICD-10-CM

## 2023-02-16 LAB — CBC W/ DIFFERENTIAL
BANDED NEUTROPHILS ABSOLUTE COUNT: 0 10*3/uL (ref 0.0–0.1)
BASOPHILS ABSOLUTE COUNT: 0 10*3/uL (ref 0.0–0.2)
BASOPHILS RELATIVE PERCENT: 1 %
EOSINOPHILS ABSOLUTE COUNT: 0 10*3/uL (ref 0.0–0.4)
EOSINOPHILS RELATIVE PERCENT: 1 %
HEMATOCRIT: 35.5 % — ABNORMAL LOW (ref 37.5–51.0)
HEMOGLOBIN: 12.5 g/dL — ABNORMAL LOW (ref 13.0–17.7)
IMMATURE GRANULOCYTES: 1 %
LYMPHOCYTES ABSOLUTE COUNT: 0.4 10*3/uL — ABNORMAL LOW (ref 0.7–3.1)
LYMPHOCYTES RELATIVE PERCENT: 20 %
MEAN CORPUSCULAR HEMOGLOBIN CONC: 35.2 g/dL (ref 31.5–35.7)
MEAN CORPUSCULAR HEMOGLOBIN: 36.3 pg — ABNORMAL HIGH (ref 26.6–33.0)
MEAN CORPUSCULAR VOLUME: 103 fL — ABNORMAL HIGH (ref 79–97)
MONOCYTES ABSOLUTE COUNT: 0.3 10*3/uL (ref 0.1–0.9)
MONOCYTES RELATIVE PERCENT: 14 %
NEUTROPHILS ABSOLUTE COUNT: 1.4 10*3/uL (ref 1.4–7.0)
NEUTROPHILS RELATIVE PERCENT: 63 %
PLATELET COUNT: 133 10*3/uL — ABNORMAL LOW (ref 150–450)
RED BLOOD CELL COUNT: 3.44 x10E6/uL — ABNORMAL LOW (ref 4.14–5.80)
RED CELL DISTRIBUTION WIDTH: 12.7 % (ref 11.6–15.4)
WHITE BLOOD CELL COUNT: 2.1 10*3/uL — CL (ref 3.4–10.8)

## 2023-02-17 LAB — MAGNESIUM: MAGNESIUM: 1.4 mg/dL — ABNORMAL LOW (ref 1.6–2.3)

## 2023-02-17 LAB — RENAL FUNCTION PANEL
ALBUMIN: 3.7 g/dL — ABNORMAL LOW (ref 4.1–5.1)
BLOOD UREA NITROGEN: 15 mg/dL (ref 6–24)
BUN / CREAT RATIO: 10 (ref 9–20)
CALCIUM: 12 mg/dL — ABNORMAL HIGH (ref 8.7–10.2)
CHLORIDE: 106 mmol/L (ref 96–106)
CO2: 20 mmol/L (ref 20–29)
CREATININE: 1.56 mg/dL — ABNORMAL HIGH (ref 0.76–1.27)
EGFR: 57 mL/min/{1.73_m2} — ABNORMAL LOW
GLUCOSE: 94 mg/dL (ref 70–99)
PHOSPHORUS, SERUM: 2.8 mg/dL (ref 2.8–4.1)
POTASSIUM: 4.6 mmol/L (ref 3.5–5.2)
SODIUM: 137 mmol/L (ref 134–144)

## 2023-02-19 DIAGNOSIS — Z940 Kidney transplant status: Principal | ICD-10-CM

## 2023-02-19 DIAGNOSIS — Z79899 Other long term (current) drug therapy: Principal | ICD-10-CM

## 2023-02-19 LAB — TACROLIMUS LEVEL: TACROLIMUS BLOOD: 5.9 ng/mL (ref 2.0–20.0)

## 2023-02-20 DIAGNOSIS — Z940 Kidney transplant status: Principal | ICD-10-CM

## 2023-02-20 NOTE — Unmapped (Signed)
02/20/2023 10:36am Brynda Rim at [employer] called and left VM asking for verification of employee doctor's note. Note for absence stated the patient is out of work total of 7 days 12/23-12/30 and she's required to verify authenticity of note. Phone number provided [phone]. Transplant coordinator notified.

## 2023-02-26 DIAGNOSIS — Z940 Kidney transplant status: Principal | ICD-10-CM

## 2023-02-26 DIAGNOSIS — Z79899 Other long term (current) drug therapy: Principal | ICD-10-CM

## 2023-02-26 NOTE — Unmapped (Signed)
[facility] Specialty and Home Delivery Pharmacy Refill Coordination Note    Specialty Medication(s) to be Shipped:   Infectious Disease: Biktarvy and Transplant: tacrolimus 5mg  and Prednisone 5mg     Other medication(s) to be shipped:  carvedilol,azathioprine 50 &100mg ,ventolin     Christian Brown, DOB: March 09, 1980  Phone: [phone] (home)       All above HIPAA information was verified with patient.     Was a Nurse, learning disability used for this call? No    Completed refill call assessment today to schedule patient's medication shipment from the [facility] and [facility] Delivery Pharmacy  [phone]).  All relevant notes have been reviewed.     Specialty medication(s) and dose(s) confirmed: Regimen is correct and unchanged.   Changes to medications: Spencer reports no changes at this time.  Changes to insurance: No  New side effects reported not previously addressed with a pharmacist or physician: None reported  Questions for the pharmacist: No    Confirmed patient received a Conservation officer, historic buildings and a Surveyor, mining with first shipment. The patient will receive a drug information handout for each medication shipped and additional FDA Medication Guides as required.       DISEASE/MEDICATION-SPECIFIC INFORMATION        N/A    SPECIALTY MEDICATION ADHERENCE     Medication Adherence    Patient reported X missed doses in the last month: 0  Specialty Medication: biktarvy  Patient is on additional specialty medications: No  Patient is on more than two specialty medications: No  Any gaps in refill history greater than 2 weeks in the last 3 months: no  Demonstrates understanding of importance of adherence: yes  Informant: patient  Reliability of informant: reliable  Provider-estimated medication adherence level: good  Patient is at risk for Non-Adherence: No  Reasons for non-adherence: no problems identified  Confirmed plan for next specialty medication refill: delivery by pharmacy  Refills needed for supportive medications: not needed          Refill Coordination    Has the Patients' Contact Information Changed: No  Is the Shipping Address Different: No         Were doses missed due to medication being on hold? No    Biktarvy 50-200-25   mg: 6 days of medicine on hand   Prednisone  5 mg: 6 days of medicine on hand   Azathioprine 50mg   6 days of medication on hand   Azathioprine 100mg   6 days of medication on hand   Tacrolimus 5mg  6 days of medication on hand  REFERRAL TO PHARMACIST     Referral to the pharmacist: Not needed      [facility]     Shipping address confirmed in Epic.       Delivery Scheduled: Yes, Expected medication delivery date: 01/03.     Medication will be delivered via UPS to the prescription address in Epic WAM.    [facility]  Specialty Technician

## 2023-02-27 DIAGNOSIS — Z940 Kidney transplant status: Principal | ICD-10-CM

## 2023-02-27 MED FILL — AZATHIOPRINE 100 MG TABLET: ORAL | 30 days supply | Qty: 30 | Fill #3

## 2023-02-27 MED FILL — PREDNISONE 5 MG TABLET: ORAL | 30 days supply | Qty: 30 | Fill #3

## 2023-02-27 MED FILL — CARVEDILOL 12.5 MG TABLET: ORAL | 30 days supply | Qty: 60 | Fill #2

## 2023-02-27 MED FILL — AZATHIOPRINE 50 MG TABLET: ORAL | 30 days supply | Qty: 30 | Fill #3

## 2023-02-27 MED FILL — BIKTARVY 50 MG-200 MG-25 MG TABLET: ORAL | 30 days supply | Qty: 30 | Fill #4

## 2023-02-27 MED FILL — TACROLIMUS 5 MG CAPSULE, IMMEDIATE-RELEASE: ORAL | 30 days supply | Qty: 120 | Fill #1

## 2023-02-28 NOTE — Unmapped (Signed)
Transfer Center Request Note    Requesting Physician: [facility] [phone])    Requesting Service: ED    Reason for Transfer: AKI in renal transplant patient ;     Brief Hospital Course: Presented to ED there with SOB and fever, found to have concern for pneumonia.  Fever to 101.2.  No O2 requirement presently.  Has not yet received antibiotics.  Noted also to have AKI with creatinine up to 2.88.  White blood cell count is stable at 3.0 (has had leukopenia for some time now).  Recommended empiric treatment with vancomycin and cefepime for now.  Provider was unsure if hospital stay would be willing to admit patient given transplant status.  Presently no beds available here at [facility].  Will place on waiting list here.    Reviewed medicine capacity with [facility] including ED boarders, and [facility] is currently unable to accept the patient at this time secondary to: medicine capacity issues    Plan Upon Arrival:   -Continue empiric antibiotic therapy.    -Would need likely ICD at consultation possibly pulmonary if not improving for bronchoscopy.  -Additional workup would include CMV serologies, respiratory pathogen panel and blood cultures if not completed outside.      Bed Type: Floor    Accepting Service: MDB or Appropriate for medicine services as determined by [facility] for regionalization.    Patient accepted from an emergency departement and therefore back transfer was not discussed

## 2023-03-01 ENCOUNTER — Ambulatory Visit: Admit: 2023-03-01 | Payer: MEDICARE

## 2023-03-01 ENCOUNTER — Inpatient Hospital Stay: Admit: 2023-03-01 | Discharge: 2023-03-16 | Disposition: A | Discharge status: Home / Self Care | Payer: MEDICARE

## 2023-03-01 ENCOUNTER — Encounter: Admit: 2023-03-01 | Discharge: 2023-03-16 | Payer: MEDICARE | Attending: Anesthesiology

## 2023-03-01 ENCOUNTER — Encounter
Admit: 2023-03-01 | Discharge: 2023-03-16 | Disposition: A | Discharge status: Home / Self Care | Payer: MEDICARE | Attending: Anesthesiology

## 2023-03-01 ENCOUNTER — Encounter: Admit: 2023-03-01 | Payer: MEDICARE

## 2023-03-01 DIAGNOSIS — Z940 Kidney transplant status: Principal | ICD-10-CM

## 2023-03-01 LAB — CBC W/ AUTO DIFF
BASOPHILS ABSOLUTE COUNT: 0 10*9/L (ref 0.0–0.1)
BASOPHILS RELATIVE PERCENT: 0.3 %
EOSINOPHILS ABSOLUTE COUNT: 0 10*9/L (ref 0.0–0.5)
EOSINOPHILS RELATIVE PERCENT: 0.3 %
HEMATOCRIT: 34.6 % — ABNORMAL LOW (ref 39.0–48.0)
HEMOGLOBIN: 11.9 g/dL — ABNORMAL LOW (ref 12.9–16.5)
LYMPHOCYTES ABSOLUTE COUNT: 0.3 10*9/L — ABNORMAL LOW (ref 1.1–3.6)
LYMPHOCYTES RELATIVE PERCENT: 10.4 %
MEAN CORPUSCULAR HEMOGLOBIN CONC: 34.2 g/dL (ref 32.0–36.0)
MEAN CORPUSCULAR HEMOGLOBIN: 36 pg — ABNORMAL HIGH (ref 25.9–32.4)
MEAN CORPUSCULAR VOLUME: 105 fL — ABNORMAL HIGH (ref 77.6–95.7)
MEAN PLATELET VOLUME: 8.1 fL (ref 6.8–10.7)
MONOCYTES ABSOLUTE COUNT: 0.3 10*9/L (ref 0.3–0.8)
MONOCYTES RELATIVE PERCENT: 9.9 %
NEUTROPHILS ABSOLUTE COUNT: 2.6 10*9/L (ref 1.8–7.8)
NEUTROPHILS RELATIVE PERCENT: 79.1 %
PLATELET COUNT: 228 10*9/L (ref 150–450)
RED BLOOD CELL COUNT: 3.3 10*12/L — ABNORMAL LOW (ref 4.26–5.60)
RED CELL DISTRIBUTION WIDTH: 14.7 % (ref 12.2–15.2)
WBC ADJUSTED: 3.3 10*9/L — ABNORMAL LOW (ref 3.6–11.2)

## 2023-03-01 LAB — URINALYSIS WITH MICROSCOPY WITH CULTURE REFLEX PERFORMABLE
BILIRUBIN UA: NEGATIVE
BLOOD UA: NEGATIVE
GLUCOSE UA: NEGATIVE
KETONES UA: NEGATIVE
LEUKOCYTE ESTERASE UA: NEGATIVE
NITRITE UA: NEGATIVE
PH UA: 6.5 (ref 5.0–9.0)
PROTEIN UA: NEGATIVE
RBC UA: 2 /HPF (ref <=3)
SPECIFIC GRAVITY UA: 1.009 (ref 1.003–1.030)
SQUAMOUS EPITHELIAL: <1 /HPF (ref 0–5)
UROBILINOGEN UA: <2
WBC UA: 2 /HPF (ref <=2)

## 2023-03-01 LAB — COMPREHENSIVE METABOLIC PANEL
ALBUMIN: 3.1 g/dL — ABNORMAL LOW (ref 3.4–5.0)
ALKALINE PHOSPHATASE: 399 U/L — ABNORMAL HIGH (ref 46–116)
ALT (SGPT): <7 U/L — ABNORMAL LOW (ref 10–49)
ANION GAP: 9 mmol/L (ref 5–14)
AST (SGOT): 11 U/L (ref <=34)
BILIRUBIN TOTAL: 0.4 mg/dL (ref 0.3–1.2)
BLOOD UREA NITROGEN: 22 mg/dL (ref 9–23)
BUN / CREAT RATIO: 9
CALCIUM: 11.8 mg/dL — ABNORMAL HIGH (ref 8.7–10.4)
CHLORIDE: 111 mmol/L — ABNORMAL HIGH (ref 98–107)
CO2: 21 mmol/L (ref 20.0–31.0)
CREATININE: 2.47 mg/dL — ABNORMAL HIGH (ref 0.73–1.18)
EGFR CKD-EPI (2021) MALE: 33 mL/min/{1.73_m2} — ABNORMAL LOW (ref >=60)
GLUCOSE RANDOM: 121 mg/dL (ref 70–179)
POTASSIUM: 4.2 mmol/L (ref 3.4–4.8)
PROTEIN TOTAL: 7.6 g/dL (ref 5.7–8.2)
SODIUM: 141 mmol/L (ref 135–145)

## 2023-03-01 LAB — PRO-BNP: PRO-BNP: 4342 pg/mL — ABNORMAL HIGH (ref <=300.0)

## 2023-03-01 LAB — C-REACTIVE PROTEIN: C-REACTIVE PROTEIN: 16 mg/L — ABNORMAL HIGH (ref <=10.0)

## 2023-03-01 LAB — SEDIMENTATION RATE: ERYTHROCYTE SEDIMENTATION RATE: 70 mm/h — ABNORMAL HIGH (ref 0–15)

## 2023-03-01 LAB — LACTATE, VENOUS, WHOLE BLOOD: LACTATE BLOOD VENOUS: 1.2 mmol/L (ref 0.5–1.8)

## 2023-03-01 LAB — SLIDE REVIEW

## 2023-03-01 MED ADMIN — lactated ringers bolus 1,000 mL: 1000 mL | INTRAVENOUS | @ 23:00:00 | Stop: 2023-03-01

## 2023-03-01 MED ADMIN — cefTRIAXone (ROCEPHIN) 2 g in sodium chloride 0.9 % (NS) 100 mL IVPB-MBP: 2 g | INTRAVENOUS | @ 20:00:00 | Stop: 2023-03-01

## 2023-03-01 NOTE — Unmapped (Signed)
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 The tac goal level is 6-8 ng/mL.    # Blood Pressure / Volume:  - BP goal <130/80 as tolerated  - Does not tolerate amlodipine, hydralazine, metoprolol, labetalol  - carvedilol to 12.5 mg BID    # Infectious Prophylaxis and Monitoring:   # HIV  - CMV: D-/R+ EBV: D+/R+   - CMV ppx: patient chose to do preemptive monitoring and has never taken Valcyte. Last CMV pcr was detectable at <200  - PJP ppx: High risk for PJP given immunosuppression and HIV. Does not tolerate Bactrim, pentamadine, dapsone. Atovaquone recommended but not taking.    # Pneumonia   - Prior CT chest with patchy reticulonodular opacity in both lungs and bronchiectasis  - productive cough for the past month    RECOMMENDATIONS:   - recommend some IV fluids, monitor intake/ output  - abx and further workup per ICID recommendations, may need evaluation for PJP pneumonia as he is at high risk  - agree with transplant kidney ultrasound with doppler  - Transplant patients with an open wound require wound care with sterile water only. The patient should be counseled on this at the time of discharge if they have not already been doing this.  - We will continue to follow.     Worthy Keeler, DO  03/01/2023 12:45 PM     Medical decision-making for 03/01/23  Findings / Data     Patient has: []  acute illness w/systemic sxs  [mod]  []  two or more stable chronic illnesses [mod]  []  one chronic illness with acute exacerbation [mod]  []  acute complicated illness  [mod]  []  Undiagnosed new problem with uncertain prognosis  [mod] [x]  illness posing risk to life or bodily function (ex. AKI)  [high]  []  chronic illness with severe exacerbation/progression  [high]  []  chronic illness with severe side effects of treatment  [high] AKI, pneumonia Probs At least 2:  Probs, Data, Risk   I reviewed: [x]  primary team note  []  consultant note(s)  [x]  external records [x]  chemistry results  [x]  CBC results  []  blood gas results  []  Other []  procedure/op note(s)   []  radiology report(s)  []  micro result(s)  []  w/ independent historian(s) Elevated scr >=3 Data Review (2 of 3)    I independently interpreted: []  Urine Sediment  []  Renal US [x]  CXR Images  [x]  CT Images  []  Other []  EKG Tracing Ct and cxr reviewed Any     I discussed: []  Pathology results w/ QHPs(s) from other specialties  []  Procedural findings w/ QHPs(s) from other specialties []  Imaging w/ QHP(s) from other specialties  [x]  Treatment plan w/ QHP(s) from other specialties Plan discussed with primary team Any     Mgm't requires: []  Prescription drug(s)  [  mod]  []  Kidney biopsy  [mod]  []  Central line placement  [mod] [x]  High risk medication use and/or intensive toxicity monitoring [high]  []  Renal replacement therapy [high]  []  High risk kidney biopsy  [high]  []  Escalation of care  [high]  []  High risk central line placement  [high] Immunosuppression: high risk for infection Risk      _______________________________________________________    Transplant Background  Date of Transplant: 09/05/2022 (Kidney)  Type of Transplant: DDKT  KDPI: 18%  Cold ischemic time: 972 minutes  Warm ischemic time: 40 minutes  cPRA: 0-7%  HLA match: 4/6  Blood type: Donor O, Recipient O POS  ID: CMV: D-/R+ EBV: D+/R+, HCV donor serologies not immediately available (requesting this info)  Native Kidney Disease: collapsing FSGS/HIVAN (biopsy proven)               Native kidney biopsy: yes              Pre-transplant dialysis course: hemodialysis since 02/11/2015   Post-Transplant Course:               Delayed graft function requiring dialysis: no              Other complications: readmitted 7/31-8/2 for nausea/vomiting  Prior Transplants: None  Induction: thymoglobulin  Early steroid withdrawal: No (due to HIVAN)     Biopsies:   Zero-Hour Biopsy: none    History of Present Illness:  Christian Brown is a/an 43 y.o. 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 Nephrology has been consulted for immunosuppression management, AKI.     Has been having a cough for the past month, that started out unproductive but became productive a few weeks ago. Also with shortness of breath. Went to urgent care and was sent home with an inhaler that he was not able to obtain. Went back to the ED and was diagnosed with pneumonia, then left AMA and came here. Still complains of cough and shortness of breath.    Medications:  Current Facility-Administered Medications:     cefTRIAXone (ROCEPHIN) 1 g in sodium chloride 0.9 % (NS) 100 mL IVPB-MBP, Intravenous, Once    sodium chloride 0.9 % (NS) bag 1,000 mL, Intravenous, Once    Prior to Admission medications    Medication Dose, Route, Frequency   acetaminophen (TYLENOL) 160 mg/5 mL solution 650 mg, Oral, Every 6 hours PRN   albuterol HFA 90 mcg/actuation inhaler Inhale 1-2 puffs every four (4) to six (6) hours as needed for wheezing or shortness of breath.   azathioprine (AZASAN) 100 mg tablet 100 mg, Oral, Daily (standard), Take in addition to 50 mg tablet for a total daily dose of 150 mg daily azathioprine (IMURAN) 50 mg tablet 50 mg, Oral, Daily (standard), Take in addition to 100 mg tablet for a total daily dose to of 150 mg daily   bictegrav-emtricit-tenofov ala (BIKTARVY) 50-200-25 mg tablet 1 tablet, Oral, Every morning   calamine-zinc oxide 8-8 % Lotn Topical, 4 times a day   carvedilol (COREG) 12.5 MG tablet 12.5 mg, Oral, 2 times a day with meals   carvedilol (COREG) 12.5 MG tablet 12.5 mg, Oral, 2 times a day (standard)   DIETARY SUPPLEMENT,MISC COMB14 ORAL Taking magnesium gummy supplement once daily. Dosage strength & product unknown    diphenhydrAMINE (BENADRYL) 25 mg  capsule 25 mg, Oral, Every 6 hours PRN   empty container Misc Use as directed with Zarxio   filgrastim-sndz (ZARXIO) 480 mcg/0.8 mL Syrg 480 mcg, Subcutaneous, Weekly, Administer only as directed by transplant team.   hydrocortisone 1 % cream Topical, 2 times a day (standard)  Patient not taking: Reported on 10/27/2022   pantoprazole (PROTONIX) 40 MG tablet 40 mg, Oral, 2 times a day (standard)   predniSONE (DELTASONE) 5 MG tablet 5 mg, Oral, Daily   tacrolimus (PROGRAF) 1 MG capsule HOLD   tacrolimus (PROGRAF) 5 MG capsule 10 mg, Oral, 2 times a day      Allergies:  Amlodipine, Doxercalciferol, Fish containing products, Iron sucrose, Labetalol, Metoclopramide, Metoprolol, Nifedipine, Other, Sulfamethoxazole-trimethoprim, Vancomycin, Calcitonin, Peanut oil, Potassium clavulanate, Azathioprine, Clindamycin, Hydralazine, and Sensipar [cinacalcet]    Medical History:  Past Medical History:   Diagnosis Date    Acquired immunodeficiency syndrome (CMS-HCC) 11/14/2013    Formatting of this note might be different from the original.   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   He appears to be doing well. States he is back to 90% except for his leg swelling.    Will change his epivir to 150 mg daily when he completes his liquid rx. Will see him back in 1 month and repeat his labs th    Acute deep vein thrombosis (DVT) of popliteal vein of left lower extremity (CMS-HCC) 02/05/2019    Last Assessment & Plan:   Formatting of this note might be different from the original.  Moderate effusion left knee new the some limitation of motion.  No evidence of sepsis the white count the normal  will     Proceed on with an aspiration for cell count, culture and crystal analysis.  New x-rays ordered and currently pending will follow.  Further therapeutic recommendations contingent on the re    Anemia     Asthma     Candidal esophagitis (CMS-HCC) 07/30/2022    Community acquired pneumonia 07/30/2014    DVT (deep venous thrombosis) (CMS-HCC) 11/24/2013    Last Assessment & Plan:    Formatting of this note might be different from the original.   Appears to be doing better (swelling improved). He will f/u with PCP regarding coumadin dosing (INR is low).  Formatting of this note might be different from the original.   Last Assessment & Plan:    Formatting of this note might be different from the original.   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   greatly appreciate renal f/u.  Formatting of this note might be different from the original.   Last Assessment & Plan:    greatly appreciate renal f/u.   Last Assessment & Plan:    greatly appreciate renal f/u.  Formatting of this note might be different from the original.   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    Rectal bleeding 04/23/2019    Sleep apnea      Past Surgical History:   Procedure Laterality Date    INTUSSUSCEPTION REPAIR      PERICARDIAL WINDOW      PR AUTOTRANSPLANT, PARATHYROID Bilateral 06/17/2019    Procedure: PARATHYROID AUTOTRANSPLANTATION;  Surgeon: Karren Burly, MD;  Location: MAIN OR [facility];  Service: ENT    PR CREAT AV FISTULA,NON-AUTOGENOUS GRAFT Left 07/05/2022    Procedure: CREATE AV FISTULA (SEPARATE PROC); NONAUTOGENOUS GRAFT (EG, BIOLOGICAL COLLAGEN, THERMOPLASTIC GRAFT), UPPER EXTREMITY;  Surgeon: Toledo, Lilyan Punt, MD;  Location: MAIN OR [facility];  Service: Transplant    PR EXCISION TURBINATE,SUBMUCOUS Bilateral 10/22/2020    Procedure: SUBMUCOUS RESECTION INFERIOR TURBINATE, PARTIAL OR COMPLETE, ANY METHOD;  Surgeon: Maeola Sarah, MD;  Location: OR [facility];  Service: ENT    PR EXPLORE PARATHYROID GLANDS Bilateral 06/17/2019    Procedure: PARATHYROIDECTOMY OR EXPLORATION OF PARATHYROID(S);  Surgeon: Karren Burly, MD;  Location: MAIN OR [facility];  Service: ENT    PR REPAIR OF NASAL SEPTUM N/A 10/22/2020    Procedure: SEPTOPLASTY OR SUBMUCOUS RESECTION, WITH OR WITHOUT CARTILAGE SCORING, CONTOURING OR REPLACEMENT WITH GRAFT;  Surgeon: Maeola Sarah, MD;  Location: OR [facility];  Service: ENT    PR REVISE AV FISTULA,W/O THROMBECTOMY Left 06/06/2022    Procedure: REVISON, OPEN, ARTERIOVENOUS FISTULA; WITHOUT THROMBECTOMY, AUTOGENOUS OR NONAUTOGENOUS DIALYSIS GRAFT;  Surgeon: Toledo, Lilyan Punt, MD;  Location: MAIN OR [facility];  Service: Transplant    PR REVISE AV FISTULA,W/O THROMBECTOMY Left 07/05/2022    Procedure: REVISON, OPEN, ARTERIOVENOUS FISTULA; WITHOUT THROMBECTOMY, AUTOGENOUS OR NONAUTOGENOUS DIALYSIS GRAFT;  Surgeon: Toledo, Lilyan Punt, MD;  Location: MAIN OR [facility];  Service: Transplant    PR RIGHT HEART CATH O2 SATURATION & CARDIAC OUTPUT N/A 07/20/2020    Procedure: Right Heart Catheterization with PULMONARY ANGIOGRAM;  Surgeon: Marlaine Hind, MD;  Location: [facility] CATH;  Service: Cardiology    PR TRANSPLANT,PREP RENAL GRAFT/ARTERIAL N/A 09/05/2022    Procedure: [facility] RECONSTRUCTION CADAVER/LIVING DONOR RENAL ALLOGRAFT PRIOR TO TRANSPLANT; ARTERIAL ANASTOMOSIS EAC;  Surgeon: Toledo, Lilyan Punt, MD;  Location: MAIN OR [facility];  Service: Transplant    PR TRANSPLANTATION OF KIDNEY N/A 09/05/2022    Procedure: RENAL ALLOTRANSPLANTATION, IMPLANTATION OF GRAFT; WITHOUT RECIPIENT NEPHRECTOMY;  Surgeon: Toledo, Lilyan Punt, MD;  Location: MAIN OR [facility];  Service: Transplant    PR UPPER GI ENDOSCOPY,BIOPSY N/A 07/31/2022    Procedure: UGI ENDOSCOPY; WITH BIOPSY, SINGLE OR MULTIPLE;  Surgeon: Janace Aris, MD;  Location: [facility];  Service: Gastroenterology    PR UPPER GI ENDOSCOPY,BIOPSY N/A 09/12/2022    Procedure: UGI ENDOSCOPY; WITH BIOPSY, SINGLE OR MULTIPLE;  Surgeon: Zetta Bills, MD;  [facility]: [facility];  Service: Gastroenterology    UMBILICAL HERNIA REPAIR       Social History:  Social History     Social History Narrative    Not on file      reports that he has never smoked. He has never used smokeless tobacco. He reports that he does not currently use alcohol. He reports that he does not use drugs.     Family History:  Family History   Problem Relation Age of Onset    Hypertension Mother     Arthritis Mother  Hypertension Father       Physical Exam:   Vitals:    03/01/23 1038 03/01/23 1043   BP:  146/94   Pulse: 67 91   Resp:  18   Temp:  37.1 ??C (98.8 ??F)   TempSrc:  Oral   SpO2: 100% 100%   Weight:  68.9 kg (152 lb)     No intake/output data recorded.  No intake or output data in the 24 hours ending 03/01/23 1245    Constitutional: well-appearing, no acute distress  Heart: regular rate and rhythm, no murmurs, rubs, or gallops  Lungs: +wheezes  Abd: soft, non-tender, non-distended  Ext: no edema

## 2023-03-01 NOTE — Unmapped (Signed)
Pt coming in POV for abnormal labs. Pt states he is a kidney transplant pt and was recently diagnosed with Pneumonia. Pt says creatine levels are elevated and sent over for further treatment.

## 2023-03-01 NOTE — Unmapped (Signed)
[facility]  Emergency Department Provider Note      ED Clinical Impression       Diagnosis ICD-10-CM Associated Orders   1. Pneumonia of both lungs due to infectious organism, unspecified part of lung  J18.9       2. Transplanted kidney  Z94.0       3. AKI (acute kidney injury) (CMS-HCC)  N17.9                Impression, Medical Decision Making, Progress Notes and Critical Care      Christian Brown is a 43 y.o. 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    Exam he is overall well-appearing, afebrile with reassuring vital signs.  Scattered wheezes on lung exam.    Plan to repeat chest x-ray and labs since I cannot see them from his other recent visit.  If creatinine is elevated will need to consult transplant nephrology.        ED Course as of 03/01/23 1443   Thu Mar 01, 2023   1105 My independent interpretation of the EKG is   Sinus rhythm rate of 94 with occasional PVCs, normal axis, narrow QRS, left ventricular hypertrophy with repolarization abnormality, ST segment depression with inverted T waves in leads V5, V6 and I.  Was seen on prior EKG but looks somewhat more pronounced today.   04/10/35 Creatinine(!): 2.47   1237 Creatinine up from baseline of 1.5-1.7.  Transplant nephrology paged.   04/10/1235 Sed Rate(!): 70   1237 PRO-BNP(!): 4,342.0  No prior BNP for comparison   1237 CRP(!): 16.0   1237 WBC(!): 3.3   1237 HGB(!): 11.9   1237 Hemoglobin decreased from 12.5   1443 Discussed with transplant nephrology Dr. Lucienne Minks True who recommends admission to medicine for further management.  Kidney ultrasound with Doppler pending at time of note. Care signed out to oncoming team who will follow-up results and reassess the patient.             Additional MDM Elements             I have reviewed recent and relavant previous record, including: Outpatient notes - CT from [facility] showes patchy reticulonodular opacity in both lungs compatible wih pneumonia.               Portions of this record have been created using Scientist, clinical (histocompatibility and immunogenetics). Dictation errors have been sought, but may not have been identified and corrected.    See chart and nursing documentation for additional ED course details.           History        Reason for Visit  Abnormal Lab      HPI   Christian Brown is a 43 y.o. 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  He reports for the past month he has had a productive cough and shortness of breath.  He was seen yesterday at [facility] and was diagnosed with pneumonia.  He was told his creatinine was 2.7.  They were planning to transfer him but he ultimately left due to delays and came here on his own.  He got 1 dose of an IV antibiotic while in the ED.    He  states he has been urinating normally for the past few days but there was a time earlier on that he had decreased urine output.  He states that at that point his creatinine was checked and was normal.  He also had a fever for 3 days at the beginning of this illness but none recently.    Kidney transplant was performed in July 2024 at [facility].    Outside Historian(s):        Past Medical History:   Diagnosis Date    Acquired immunodeficiency syndrome (CMS-HCC) 11/14/2013    Formatting of this note might be different from the original.   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   He appears to be doing well. States he is back to 90% except for his leg swelling.    Will change his epivir to 150 mg daily when he completes his liquid rx. Will see him back in 1 month and repeat his labs th    Acute deep vein thrombosis (DVT) of popliteal vein of left lower extremity (CMS-HCC) 02/05/2019    Last Assessment & Plan:   Formatting of this note might be different from the original.  Moderate effusion left knee new the some limitation of motion.  No evidence of sepsis the white count the normal  will     Proceed on with an aspiration for cell count, culture and crystal analysis.  New x-rays ordered and currently pending will follow.  Further therapeutic recommendations contingent on the re    Anemia     Asthma     Candidal esophagitis (CMS-HCC) 07/30/2022    Community acquired pneumonia 07/30/2014    DVT (deep venous thrombosis) (CMS-HCC) 11/24/2013    Last Assessment & Plan:    Formatting of this note might be different from the original.   Appears to be doing better (swelling improved). He will f/u with PCP regarding coumadin dosing (INR is low).  Formatting of this note might be different from the original.   Last Assessment & Plan:    Formatting of this note might be different from the original.   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   greatly appreciate renal f/u.  Formatting of this note might be different from the original.   Last Assessment & Plan:    greatly appreciate renal f/u.   Last Assessment & Plan:    greatly appreciate renal f/u.  Formatting of this note might be different from the original.   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;  Service: Transplant    PR EXCISION TURBINATE,SUBMUCOUS Bilateral 10/22/2020    Procedure: SUBMUCOUS RESECTION INFERIOR TURBINATE, PARTIAL OR COMPLETE, ANY METHOD;  Surgeon: Maeola Sarah, MD;  Location: OR [facility];  Service: ENT    PR EXPLORE PARATHYROID GLANDS Bilateral 06/17/2019    Procedure: PARATHYROIDECTOMY OR EXPLORATION OF PARATHYROID(S);  Surgeon: Karren Burly, MD;  Location: MAIN OR [facility];  Service: ENT    PR REPAIR OF NASAL SEPTUM N/A 10/22/2020    Procedure: SEPTOPLASTY OR SUBMUCOUS RESECTION, WITH OR WITHOUT CARTILAGE SCORING, CONTOURING OR REPLACEMENT WITH GRAFT;  Surgeon: Maeola Sarah, MD;  Location: OR [facility];  Service: ENT    PR REVISE AV FISTULA,W/O THROMBECTOMY Left 06/06/2022    Procedure: REVISON, OPEN, ARTERIOVENOUS FISTULA; WITHOUT THROMBECTOMY, AUTOGENOUS OR NONAUTOGENOUS DIALYSIS GRAFT;  Surgeon: Toledo, Lilyan Punt, MD;  Location: MAIN OR [facility];  Service: Transplant    PR REVISE AV FISTULA,W/O THROMBECTOMY Left 07/05/2022    Procedure: REVISON, OPEN, ARTERIOVENOUS FISTULA; WITHOUT THROMBECTOMY, AUTOGENOUS OR NONAUTOGENOUS DIALYSIS GRAFT;  Surgeon: Toledo, Lilyan Punt, MD;  Location: MAIN OR [facility];  Service: Transplant    PR RIGHT HEART CATH O2 SATURATION & CARDIAC OUTPUT N/A 07/20/2020    Procedure: Right Heart Catheterization with PULMONARY ANGIOGRAM;  Surgeon: Marlaine Hind, MD; Location: [facility] CATH;  Service: Cardiology    PR TRANSPLANT,PREP RENAL GRAFT/ARTERIAL N/A 09/05/2022    Procedure: [facility] RECONSTRUCTION CADAVER/LIVING DONOR RENAL ALLOGRAFT PRIOR TO TRANSPLANT; ARTERIAL ANASTOMOSIS EAC;  Surgeon: Toledo, Lilyan Punt, MD;  [facility];  Service: Transplant    PR TRANSPLANTATION OF KIDNEY N/A 09/05/2022    Procedure: RENAL ALLOTRANSPLANTATION, IMPLANTATION OF GRAFT; WITHOUT RECIPIENT NEPHRECTOMY;  Surgeon: Toledo, Lilyan Punt, MD;  Location: MAIN OR [facility];  Service: Transplant    PR UPPER GI ENDOSCOPY,BIOPSY N/A 07/31/2022    Procedure: UGI ENDOSCOPY; WITH BIOPSY, SINGLE OR MULTIPLE;  Surgeon: Janace Aris, MD;  [facility]: [facility];  Service: Gastroenterology    PR UPPER GI ENDOSCOPY,BIOPSY N/A 09/12/2022    Procedure: UGI ENDOSCOPY; WITH BIOPSY, SINGLE OR MULTIPLE;  Surgeon: Zetta Bills, MD;  Location: [facility];  Service: Gastroenterology    UMBILICAL HERNIA REPAIR           Current Facility-Administered Medications:     cefTRIAXone (ROCEPHIN) 2 g in sodium chloride 0.9 % (NS) 100 mL IVPB-MBP, 2 g, Intravenous, Once, Matilde Haymaker, Marlyne Beards, MD    sodium chloride 0.9 % (NS) bag 1,000 mL, 1,000 mL, Intravenous, Once, Kotzen, Francine Graven, MD, Stopped at 10/06/22 1550    Current Outpatient Medications:     acetaminophen (TYLENOL) 160 mg/5 mL solution, Take 20.3 mL (650 mg total) by mouth every six (6) hours as needed for fever or pain., Disp: 473 mL, Rfl: 0    albuterol HFA 90 mcg/actuation inhaler, Inhale 1-2 puffs every four (4) to six (6) hours as needed for wheezing or shortness of breath., Disp: 18 g, Rfl: 11    azathioprine (AZASAN) 100 mg tablet, Take 1 tablet (100 mg total) by mouth  daily. Take in addition to 50 mg tablet for a total daily dose of 150 mg daily, Disp: 90 tablet, Rfl: 3    azathioprine (IMURAN) 50 mg tablet, Take 1 tablet (50 mg total) by mouth daily. Take in addition to 100 mg tablet for a total daily dose to of 150 mg daily, Disp: 30 tablet, Rfl: 11    bictegrav-emtricit-tenofov ala (BIKTARVY) 50-200-25 mg tablet, Take 1 tablet by mouth every morning., Disp: 30 tablet, Rfl: 11    calamine-zinc oxide 8-8 % Lotn, Apply topically four (4) times a day., Disp: 177 mL, Rfl: 0    carvedilol (COREG) 12.5 MG tablet, Take 1 tablet (12.5 mg total) by mouth in the morning and 1 tablet (12.5 mg total) in the evening. Take with meals., Disp: 60 tablet, Rfl: 11    carvedilol (COREG) 12.5 MG tablet, Take 1 tablet (12.5 mg total) by mouth two (2) times a day., Disp: 60 tablet, Rfl: 11    DIETARY SUPPLEMENT,MISC COMB14 ORAL, Taking magnesium gummy supplement once daily. Dosage strength & product unknown, Disp: , Rfl:     diphenhydrAMINE (BENADRYL) 25 mg capsule, Take 1 capsule (25 mg total) by mouth every six (6) hours as needed for itching., Disp: 96 capsule, Rfl: 0    empty container Misc, Use as directed with Zarxio, Disp: 1 each, Rfl: 0    filgrastim-sndz (ZARXIO) 480 mcg/0.8 mL Syrg, Inject 0.8 mL (480 mcg total) under the skin once a week. Administer only as directed by transplant team., Disp: 3.2 mL, Rfl: 1    hydrocortisone 1 % cream, Apply topically two (2) times a day. (Patient not taking: Reported on 10/27/2022), Disp: 30 g, Rfl: 11    pantoprazole (PROTONIX) 40 MG tablet, Take 1 tablet (40 mg total) by mouth two (2) times a day., Disp: 180 tablet, Rfl: 3    predniSONE (DELTASONE) 5 MG tablet, Take 1 tablet (5 mg total) by mouth in the morning., Disp: 90 tablet, Rfl: 3    tacrolimus (PROGRAF) 1 MG capsule, HOLD, Disp: , Rfl:     tacrolimus (PROGRAF) 5 MG capsule, Take 2 capsules (10 mg total) by mouth two (2) times a day., Disp: 360 capsule, Rfl: 3    Allergies  Amlodipine, Doxercalciferol, Fish containing products, Iron sucrose, Labetalol, Metoclopramide, Metoprolol, Nifedipine, Other, Sulfamethoxazole-trimethoprim, Vancomycin, Calcitonin, Peanut oil, Potassium clavulanate, Azathioprine, Clindamycin, Hydralazine, and Sensipar [cinacalcet]    Family History   Problem Relation Age of Onset    Hypertension Mother     Arthritis Mother     Hypertension Father        Social History     Tobacco Use    Smoking status: Never    Smokeless tobacco: Never   Vaping Use    Vaping status: Never Used   Substance Use Topics    Alcohol use: Not Currently    Drug use: Never          Physical Exam     ED Triage Vitals   Enc Vitals Group      BP 03/01/23 1043 146/94      Heart Rate 03/01/23 1038 67      SpO2 Pulse --       Resp 03/01/23 1043 18      Temp 03/01/23 1043 37.1 ??C (98.8 ??F)      Temp Source 03/01/23 1043 Oral      SpO2 03/01/23 1038 100 %      Weight 03/01/23 1043 68.9 kg (152 lb)  Height --       Head Circumference --       Peak Flow --       Pain Score --       Pain Loc --       Pain Education --       Exclude from Growth Chart --        Constitutional: Alert and oriented. Well appearing and in no distress.  Eyes: Conjunctivae are normal.  ENT       Head: Normocephalic and atraumatic.       Nose: No congestion.       Mouth/Throat: Mucous membranes are moist.       Neck: No stridor.  Cardiovascular: Normal rate, regular rhythm.   Respiratory: Normal respiratory effort.  No crackles but he has scattered mild wheezes particularly on the right lung and left lung base   Gastrointestinal: Abdomen nondistended.  Musculoskeletal: Normal range of motion in all extremities.       Right lower leg: No tenderness or edema.       Left lower leg: No tenderness or edema.  Neurologic: Normal speech and language. No gross focal neurologic deficits are appreciated.  Skin: Skin is warm, dry and intact. No rash noted.  Psychiatric: Mood and affect are normal. Speech and behavior are normal.       Radiology     XR Chest 2 views   Final Result      No acute findings.      US Renal Transplant W Doppler    (Results Pending)           March 01, 2023 10:51 AM. Documentation assistance provided by the scribe. I was present during the time the encounter was recorded. The information recorded by the scribe was done at my direction and has been reviewed and validated by me.          Mauri Pole, MD  03/01/23 [phone]

## 2023-03-01 NOTE — Unmapped (Signed)
Returned call to patient this am who reports he left the [facility] Emergency dept AMA last evening.  He reprts he did not feel comfortable staying there and left.  He was not given and oral antibiotic when he left.  Pt advised he needs to come to [facility] ED as he was awaiting a bed here for transfer.  Pt reports he will come to [facility] ED this am.   He is unsure if he will drive or if his mom will bring him.  Pt denies any fevers or SOB this am.

## 2023-03-01 NOTE — Unmapped (Signed)
Patient contacted his transplant coordinator. He was seen at [facility] ER on 12/31 for pneumonia and AKI, and left AMA. He now plans to come to the [facility] ED.    Please note this patient is at higher risk for PJP pneumonia; he is prescribed PJP prophylaxis (atovaquone) but as of his last visit with me 02/02/23 he was not taking it despite strong advice to take it. Please ask whether he is taking it, and consult to both transplant nephrology and ICID upon arrival to [facility] ER.

## 2023-03-01 NOTE — Unmapped (Signed)
Pt c/o elevated creatine levels. Hx of kidney transplant.

## 2023-03-02 LAB — BASIC METABOLIC PANEL
ANION GAP: 12 mmol/L (ref 5–14)
ANION GAP: 12 mmol/L (ref 5–14)
BLOOD UREA NITROGEN: 23 mg/dL (ref 9–23)
BLOOD UREA NITROGEN: 22 mg/dL (ref 9–23)
BUN / CREAT RATIO: 10
BUN / CREAT RATIO: 9
CALCIUM: 12 mg/dL — ABNORMAL HIGH (ref 8.7–10.4)
CALCIUM: 11 mg/dL — ABNORMAL HIGH (ref 8.7–10.4)
CHLORIDE: 110 mmol/L — ABNORMAL HIGH (ref 98–107)
CHLORIDE: 111 mmol/L — ABNORMAL HIGH (ref 98–107)
CO2: 22 mmol/L (ref 20.0–31.0)
CO2: 20 mmol/L (ref 20.0–31.0)
CREATININE: 2.41 mg/dL — ABNORMAL HIGH (ref 0.73–1.18)
CREATININE: 2.4 mg/dL — ABNORMAL HIGH (ref 0.73–1.18)
EGFR CKD-EPI (2021) MALE: 34 mL/min/{1.73_m2} — ABNORMAL LOW (ref >=60)
EGFR CKD-EPI (2021) MALE: 34 mL/min/{1.73_m2} — ABNORMAL LOW (ref >=60)
GLUCOSE RANDOM: 103 mg/dL (ref 70–179)
GLUCOSE RANDOM: 158 mg/dL (ref 70–179)
POTASSIUM: 4.3 mmol/L (ref 3.4–4.8)
POTASSIUM: 4.1 mmol/L (ref 3.4–4.8)
SODIUM: 144 mmol/L (ref 135–145)
SODIUM: 143 mmol/L (ref 135–145)

## 2023-03-02 LAB — LACTATE DEHYDROGENASE: LACTATE DEHYDROGENASE: 153 U/L (ref 120–246)

## 2023-03-02 LAB — CBC
HEMATOCRIT: 33.7 % — ABNORMAL LOW (ref 39.0–48.0)
HEMOGLOBIN: 11.5 g/dL — ABNORMAL LOW (ref 12.9–16.5)
MEAN CORPUSCULAR HEMOGLOBIN CONC: 34 g/dL (ref 32.0–36.0)
MEAN CORPUSCULAR HEMOGLOBIN: 35.9 pg — ABNORMAL HIGH (ref 25.9–32.4)
MEAN CORPUSCULAR VOLUME: 105.5 fL — ABNORMAL HIGH (ref 77.6–95.7)
MEAN PLATELET VOLUME: 8 fL (ref 6.8–10.7)
PLATELET COUNT: 233 10*9/L (ref 150–450)
RED BLOOD CELL COUNT: 3.2 10*12/L — ABNORMAL LOW (ref 4.26–5.60)
RED CELL DISTRIBUTION WIDTH: 14.8 % (ref 12.2–15.2)
WBC ADJUSTED: 2.9 10*9/L — ABNORMAL LOW (ref 3.6–11.2)

## 2023-03-02 LAB — LYMPH MARKER LIMITED,FLOW
ABSOLUTE CD3 CNT: 246 {cells}/uL — ABNORMAL LOW (ref 915–3400)
ABSOLUTE CD4 CNT: 75 {cells}/uL — ABNORMAL LOW (ref 510–2320)
ABSOLUTE CD8 CNT: 165 {cells}/uL — ABNORMAL LOW (ref 180–1520)
CD3% (T CELLS): 82 % (ref 61–86)
CD4% (T HELPER): 25 % — ABNORMAL LOW (ref 34–58)
CD4:CD8 RATIO: 0.5 — ABNORMAL LOW (ref 0.9–4.8)
CD8% T SUPPRESR: 55 % — ABNORMAL HIGH (ref 12–38)

## 2023-03-02 LAB — TSH: THYROID STIMULATING HORMONE: 1.143 u[IU]/mL (ref 0.550–4.780)

## 2023-03-02 LAB — PHOSPHORUS: PHOSPHORUS: 2.7 mg/dL (ref 2.4–5.1)

## 2023-03-02 LAB — HIGH SENSITIVITY TROPONIN I - SINGLE
HIGH SENSITIVITY TROPONIN I: 60 ng/L (ref <=53)
HIGH SENSITIVITY TROPONIN I: 62 ng/L (ref <=53)
HIGH SENSITIVITY TROPONIN I: 44 ng/L (ref <=53)

## 2023-03-02 LAB — SYPHILIS SCREEN: SYPHILIS RPR SCREEN: NONREACTIVE

## 2023-03-02 LAB — TACROLIMUS LEVEL, TROUGH: TACROLIMUS, TROUGH: 4.8 ng/mL — ABNORMAL LOW (ref 5.0–15.0)

## 2023-03-02 LAB — IONIZED CALCIUM VENOUS
CALCIUM IONIZED VENOUS (MG/DL): 6.97 mg/dL (ref 4.40–5.40)
CALCIUM IONIZED VENOUS (MG/DL): 6.25 mg/dL — ABNORMAL HIGH (ref 4.40–5.40)

## 2023-03-02 LAB — CMV DNA, QUANTITATIVE, PCR: CMV VIRAL LD: NOT DETECTED

## 2023-03-02 LAB — CA 125: CA 125: 14 U/mL (ref 0–35)

## 2023-03-02 LAB — PARATHYROID HORMONE (PTH): PARATHYROID HORMONE INTACT: 1795.7 pg/mL — ABNORMAL HIGH (ref 18.4–80.1)

## 2023-03-02 LAB — MAGNESIUM: MAGNESIUM: 1.4 mg/dL — ABNORMAL LOW (ref 1.6–2.6)

## 2023-03-02 MED ADMIN — sodium chloride (NS) 0.9 % infusion: 150 mL/h | INTRAVENOUS | @ 16:00:00 | Stop: 2023-03-02

## 2023-03-02 MED ADMIN — piperacillin-tazobactam (ZOSYN) 3.375 g in sodium chloride 0.9 % (NS) 100 mL IVPB-MBP: 3.375 g | INTRAVENOUS | @ 18:00:00 | Stop: 2023-03-02

## 2023-03-02 MED ADMIN — bictegrav-emtricit-tenofov ala (BIKTARVY) 50-200-25 mg tablet 1 tablet: 1 | ORAL | @ 14:00:00

## 2023-03-02 MED ADMIN — azithromycin (ZITHROMAX) tablet 500 mg: 500 mg | ORAL | @ 18:00:00 | Stop: 2023-03-02

## 2023-03-02 MED ADMIN — pantoprazole (Protonix) EC tablet 40 mg: 40 mg | ORAL | @ 06:00:00

## 2023-03-02 MED ADMIN — tacrolimus (PROGRAF) capsule 10 mg: 10 mg | ORAL | @ 02:00:00

## 2023-03-02 MED ADMIN — azathioprine (IMURAN) tablet 150 mg: 150 mg | ORAL | @ 02:00:00

## 2023-03-02 MED ADMIN — levoFLOXacin (LEVAQUIN) tablet 500 mg: 500 mg | ORAL | @ 23:00:00 | Stop: 2023-03-16

## 2023-03-02 MED ADMIN — predniSONE (DELTASONE) tablet 5 mg: 5 mg | ORAL | @ 14:00:00

## 2023-03-02 MED ADMIN — pantoprazole (Protonix) EC tablet 40 mg: 40 mg | ORAL | @ 14:00:00

## 2023-03-02 MED ADMIN — carvedilol (COREG) tablet 12.5 mg: 12.5 mg | ORAL | @ 14:00:00

## 2023-03-02 MED ADMIN — tacrolimus (PROGRAF) capsule 10 mg: 10 mg | ORAL | @ 14:00:00 | Stop: 2023-03-02

## 2023-03-02 MED ADMIN — carvedilol (COREG) tablet 12.5 mg: 12.5 mg | ORAL | @ 06:00:00

## 2023-03-02 NOTE — Unmapped (Signed)
Tacrolimus Therapeutic Monitoring Pharmacy Note    Christian Brown is a 43 y.o. male continuing tacrolimus.     Indication: Kidney transplant     Date of Transplant:  09/05/22       Prior Dosing Information: Home regimen 10 mg BID      Source(s) of information used to determine prior to admission dosing: Clinic Note    Goals:  Therapeutic Drug Levels  Tacrolimus trough goal:  6-8 ng/ml    Additional Clinical Monitoring/Outcomes  Monitor renal function (SCr and urine output) and liver function (LFTs)  Monitor for signs/symptoms of adverse events (e.g., hyperglycemia, hyperkalemia, hypomagnesemia, hypertension, headache, tremor)    Results:   Tacrolimus level: Not applicable    Pharmacokinetic Considerations and Significant Drug Interactions:  Concurrent hepatotoxic medications: None identified  Concurrent CYP3A4 substrates/inhibitors: None identified  Concurrent nephrotoxic medications: None identified    Assessment/Plan:  Recommendedation(s)  Start tacrolimus 10 mg BID    Follow-up  Next level to be determined by primary team.   A pharmacist will continue to monitor and recommend levels as appropriate    Please page service pharmacist with questions/clarifications.    Meridee Score, PharmD

## 2023-03-02 NOTE — Unmapped (Signed)
IMMUNOCOMPROMISED HOST INFECTIOUS DISEASE CONSULT NOTE    Christian Brown is being seen in consultation at the request of Elmer Picker, MD for evaluation of cough and possible bilateral lower lobe pneumonia.    Assessment/Recommendations:    Christian Brown is a 43 y.o. 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  - Immunosuppression: Tacrolimus (Prograf) goal 6-8ng/mL, azathioprine and Prednisone 5mg  daily. Initially was on MMF but did not tolerate due to gastritis  - History of rejection if any: none but noted with AKI and c/f acute rejection  - s/p stent removal 10/27/22     Pertinent comorbidities:  Hx pericardial effusion s/p pericardial window  Mitral valve stenosis  Pulmonary HTN with RV dysfunction  OSA on bipap  DVT 2015,  Hx genital warts  Hx seizures, per report 2/2 reglan; last seizure >10 years ago  Intussusception s/p bowel resection, ileocecectomy   Multiple seasonal/environmental allergies  Gastric ulcer with moderate duodenitis (07/2022) with repeat EGD 09/12/22 with severe esophagitis. Infectious stains negative, leading etiology mycophenolate induced esophagitis with plans for fu EGD in 2 months   Post transplant hyperparathyroidism and hypercalcemia - pending surgical removal 03/14/2023  AKI Estimated Creatinine Clearance: 38.1 mL/min (A) (based on SCr of 2.41 mg/dL (H)).   - 03/01/2023: Transplant Renal Doppler US: The renal transplant was located in the right lower quadrant. Normal size and echogenicity. Mild dilatation of the renal pelvis and central calyces. 0.4 cm echogenic focus with associated twinkle artifact, which may represent a nonobstructive renal calculus. No solid masses. No perinephric collections identified.   - 1/2 CMV negative, EBV and BK virus pending    Pertinent exposures:  Travel: [place] 2023, [place] 2024    Summary of pertinent prior infections:  - Right otitis media treated w cefdinir/ ofloxacin otic gtts 05/2020  - E faecalis UTI 05/2016; vanc and ampicillin sensitive  - MRSA right ear culture/otitis, 10/2015  - Cellulitis 11/2013  - Right thigh abscess w/ Group A Strep; I&D right lower extremity 2016; treated with primaxin, clinda, dapto; discharged on Keflex  - Shingles before 2015  -Buttock abscess 04/2021 and prior frequent SSTI/skin boils  - remote HSV reactivation (mucocutaneous disease)    Hx Syphilis 11/2019  - RPR 1:8 (11/2019); treated (nonreactive 10/2018)  - per CE, re-treated with 14 days of doxycycline  - No other stds  - RPR 1:8 (02/2020), RPR 1:4 (06/2020) -> RPR 1:2 (01/2021)    Hx of PJP. 2015     Active infection:  # Cough with c/f bilateral lower lobe PnA vs. Acute bronchiolitis on CT Chest, 12/31  - 12/31 COVID, FLU, RSV negative  - 12/31: CT Chest: Patchy reticulonodular opacity in both lungs is compatible with pneumonia. Bronchiectasis in both lower lobes probably reflects sequela of chronic infection or aspiration.    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 There is concern that patient was not on his PJP ppx post transplant iso immunosuppression; however, without treatment he does look quite stable, has not been hypoxemic and states that he is improving on bacterial pneumonia coverage. We will still like to evaluate for PJP based on his history and immune status. His CT chest also is not grossly alarming for PJP at this time either. If patient is willing we do have HIV pulmonology outpatient to assist with his asthma care.    Diagnosis  Please obtain induced sputum for PJP  Please obtain LRCx if possible  Please obtain CD4 count  Will follow fungitell assay in the serum  Discuss asthma control as patient states that he was unable to get inhaler medication due to insurance issues    Management  Please stop Pip+Tazo and Azithromycin  Please start Levofloxacin 750mg  orally daily for to complete 5-7 day course pending how patient does clinically  Continue his home Biktarvy    Antimicrobial prophylaxis required for host deficiency: transplant immunosuppression  Please start Atovaquone 1500mg  orally daily for PJP ppx    Intensive toxicity monitoring for prescription antimicrobials   CBC w/diff at least once per week  CMP at least once per week  clinical assessments for rashes or other skin changes    The [facility] ID service will continue to follow.     Care for a suspected or confirmed infection was provided by an ID specialist in this encounter. ([id number])          Please page the ID Transplant/Liquid Oncology Fellow consult at [phone] with questions.  Patient discussed with Dr. Drue Second.    Neita Garnet, MBBS  [place] Division of Infectious Diseases    History of Present Illness:      External record(s): Primary team note: Differential diagnosis for his AKI: acute renal transplant rejection, prerenal AKI due to dehydration/hypercalcemi, contrast vs. Medication-induced nephropathy (noted that his creatinine was elevated prior to CT scan and Vanc administration) .    Independent historian(s): no independent historian required.       43 year old male with past medical of HIV (CD4 - 437 on 05/25/2022 and HIV RNA - Not detected on 10/27/2022), s/p kidney transplant on 08/2022 who presents with a one month history of intermittent productive yellow cough, shortness of breathe that started on Dec 20/21st and presented to [facility] ED on 12/31 with worsening of his shortness of breathe.     Upon arrival to [facility] he was afebrile and his SpO2 was 94% on RA. Of note he did have elevation of his creatinine to 2.88 and K elevated to 5.4. CT Chest was concerning for patchy reticulonodular opacity in both lunch and bronchiectasis in the bilateral lower lobes.   He had minimal improvement with his kidney function on day 2 of admission and was transferred to [facility] for evaluation for acute rejection and for further evaluation of his lung findings.     Upon arrival to [facility] he has been afebrile and WBC was 3.3 with ALC of 0.3. Some improvement in AKI with creatinine of 2.47-->2.41. Transplant renal ultrasound: New mild right hydronephrosis and nonobstructing subcentimeter stone within renal transplant. CMV negative, EBV, BK virus in process.    ICID consulted for recs on further evaluation and treatment.     Allergies:  Allergies   Allergen Reactions    Amlodipine Hives, Palpitations, Rash and Other (See Comments)     Causes fatigue      Doxercalciferol Hives    Fish Containing Products Hives and Shortness Of Breath     Please do not serve fish for meals. Highly allergic         Iron Sucrose Hives    Labetalol Itching and Rash    Metoclopramide Hives and Other (See Comments)     Causes ticks/ jerks    Metoprolol Hives    Nifedipine Hives, Itching and Shortness Of Breath    Other Swelling     Suture material, localized swelling at closure site    Sulfamethoxazole-Trimethoprim Rash and Other (See Comments)     Renal failure      Vancomycin Hives and Other (See Comments)     Patient states vancomycin caused kidney injury    Calcitonin     Peanut Oil Other (See Comments)    Potassium Clavulanate     Azathioprine Itching and Rash    Clindamycin Other (See Comments) and Palpitations     Chest pain    Hydralazine Itching and Dizziness           Sensipar [Cinacalcet] Hives       Medications:   Antimicrobials:  Anti-infectives (From admission, onward)      Start     Dose/Rate Route Frequency Ordered Stop    03/02/23 1100  piperacillin-tazobactam (ZOSYN) 3.375 g in sodium chloride 0.9 % (NS) 100 mL IVPB-MBP         3.375 g  200 mL/hr over 30 Minutes Intravenous Every 6 hours 03/02/23 0717 03/09/23 1059    03/02/23 0800  bictegrav-emtricit-tenofov ala (BIKTARVY) 50-200-25 mg tablet 1 tablet         1 tablet Oral Every morning 03/01/23 2353              Current/Prior immunomodulators per problem list. No change since admission.    Other medications reviewed.     Past Medical History:   Diagnosis Date    Acquired immunodeficiency syndrome (CMS-HCC) 11/14/2013    Formatting of this note might be different from the original.   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   He appears to be doing well. States he is back to 90% except for his leg swelling.    Will change his epivir to 150 mg daily when he completes his liquid rx. Will see him back in 1 month and repeat his labs th    Acute deep vein thrombosis (DVT) of popliteal vein of left lower extremity (CMS-HCC) 02/05/2019    Last Assessment & Plan:   Formatting of this note might be different from the original.  Moderate effusion left knee new the some limitation of motion.  No evidence of sepsis the white count the normal  will     Proceed on with an aspiration for cell count, culture and crystal analysis.  New x-rays ordered and currently pending will follow.  Further therapeutic recommendations contingent on the re    Anemia     Asthma     Candidal esophagitis (CMS-HCC) 07/30/2022    Community acquired pneumonia 07/30/2014    DVT (deep venous thrombosis) (CMS-HCC) 11/24/2013    Last Assessment & Plan:    Formatting of this note might be different from the original.  Appears to be doing better (swelling improved). He will f/u with PCP regarding coumadin dosing (INR is low).  Formatting of this note might be different from the original.   Last Assessment & Plan:    Formatting of this note might be different from the original.   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   greatly appreciate renal f/u.  Formatting of this note might be different from the original.   Last Assessment & Plan:    greatly appreciate renal f/u.   Last Assessment & Plan:    greatly appreciate renal f/u.  Formatting of this note might be different from the original.   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    Past Surgical History:   Procedure Laterality Date    INTUSSUSCEPTION REPAIR      PERICARDIAL WINDOW      PR AUTOTRANSPLANT, PARATHYROID Bilateral 06/17/2019    Procedure: PARATHYROID AUTOTRANSPLANTATION;  Surgeon: Karren Burly, MD;  Location: MAIN OR [place];  Service: ENT    PR CREAT AV FISTULA,NON-AUTOGENOUS GRAFT Left 07/05/2022    Procedure: CREATE AV FISTULA (SEPARATE PROC); NONAUTOGENOUS GRAFT (EG, BIOLOGICAL COLLAGEN, THERMOPLASTIC GRAFT), UPPER EXTREMITY;  Surgeon: Toledo, Lilyan Punt, MD;  Location: MAIN OR [facility];  Service: Transplant    PR EXCISION TURBINATE,SUBMUCOUS Bilateral 10/22/2020    Procedure: SUBMUCOUS RESECTION INFERIOR TURBINATE, PARTIAL OR COMPLETE, ANY METHOD;  Surgeon: Maeola Sarah, MD;  Location: OR [facility];  Service: ENT    PR EXPLORE PARATHYROID GLANDS Bilateral 06/17/2019    Procedure: PARATHYROIDECTOMY OR EXPLORATION OF PARATHYROID(S);  Surgeon: Karren Burly, MD;  Location: MAIN OR [facility];  Service: ENT    PR REPAIR OF NASAL SEPTUM N/A 10/22/2020    Procedure: SEPTOPLASTY OR SUBMUCOUS RESECTION, WITH OR WITHOUT CARTILAGE SCORING, CONTOURING OR REPLACEMENT WITH GRAFT;  Surgeon: Maeola Sarah, MD;  Location: OR [facility];  Service: ENT    PR REVISE AV FISTULA,W/O THROMBECTOMY Left 06/06/2022    Procedure: REVISON, OPEN, ARTERIOVENOUS FISTULA; WITHOUT THROMBECTOMY, AUTOGENOUS OR NONAUTOGENOUS DIALYSIS GRAFT;  Surgeon: Toledo, Lilyan Punt, MD;  Location: MAIN OR [facility];  Service: Transplant    PR REVISE AV FISTULA,W/O THROMBECTOMY Left 07/05/2022    Procedure: REVISON, OPEN, ARTERIOVENOUS FISTULA; WITHOUT THROMBECTOMY, AUTOGENOUS OR NONAUTOGENOUS DIALYSIS GRAFT;  Surgeon: Toledo, Lilyan Punt, MD;  Location: MAIN OR [facility];  Service: Transplant    PR RIGHT HEART CATH O2 SATURATION & CARDIAC OUTPUT N/A 07/20/2020    Procedure: Right Heart Catheterization with PULMONARY ANGIOGRAM;  Surgeon: Marlaine Hind, MD;  Location: [facility] CATH;  Service: Cardiology    PR TRANSPLANT,PREP RENAL GRAFT/ARTERIAL N/A 09/05/2022    Procedure: [facility] RECONSTRUCTION CADAVER/LIVING DONOR RENAL  ALLOGRAFT PRIOR TO TRANSPLANT; ARTERIAL ANASTOMOSIS EAC;  Surgeon: Toledo, Lilyan Punt, MD;  Location: MAIN OR [facility];  Service: Transplant    PR TRANSPLANTATION OF KIDNEY N/A 09/05/2022    Procedure: RENAL ALLOTRANSPLANTATION, IMPLANTATION OF GRAFT; WITHOUT RECIPIENT NEPHRECTOMY;  Surgeon: Toledo, Lilyan Punt, MD;  Location: MAIN OR [facility];  Service: Transplant    PR UPPER GI ENDOSCOPY,BIOPSY N/A 07/31/2022    Procedure: UGI ENDOSCOPY; WITH BIOPSY, SINGLE OR MULTIPLE;  Surgeon: Janace Aris, MD;  Location: GI PROCEDURES [facility];  Service: Gastroenterology    PR UPPER GI ENDOSCOPY,BIOPSY N/A 09/12/2022    Procedure: UGI ENDOSCOPY; WITH BIOPSY, SINGLE OR MULTIPLE;  Surgeon: Zetta Bills, MD;  Location: GI PROC[facility]URES [facility]IAL [facility];  Service: Gastroenterology    UMBILICAL HERNIA REPAIR       Denies prior surgeries with retained hardware.    Social History:  Tobacco use:   reports that he has never smoked. He has never used smokeless tobacco.   Alcohol use:    reports that he does not currently use alcohol.   Drug use:    reports no history of drug use.   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:  No animal exposure   Insect exposure:  No tick exposure   Hobbies:  Denies unusual environmental exposures   TB exposures:     Sexual history:     Other significant exposures:  No exposure to young children     Family History:  Family History   Problem Relation Age of Onset    Hypertension Mother     Arthritis Mother     Hypertension Father             Vital Signs last 24 hours:  Temp:  [36.5 ??C (97.7 ??F)-37.1 ??C (98.8 ??F)] 36.5 ??C (97.7 ??F)  Heart Rate:  [43-91] 43  SpO2 Pulse:  [89] 89  Resp:  [18-21] 20  BP: (128-173)/(86-125) 128/99  MAP (mmHg):  [99-137] 108  SpO2:  [97 %-100 %] 97 %    Physical Exam:  Patient Lines/Drains/Airways Status       Active Active Lines, Drains, & Airways       Name Placement date Placement time Site Days    Ureteral Drain/Stent 16 Fr. 09/05/22  1925  --  177    Peripheral IV 03/01/23 Right Antecubital 03/01/23  1115  Antecubital  less than 1    Arteriovenous Fistula - Vein Graft  Access  Arteriovenous fistula Left Forearm -- --  Forearm  --    Arteriovenous Fistula - Vein Graft  Access 06/06/22 Left Forearm 06/06/22  --  Forearm  269    Arteriovenous Fistula - Vein Graft  Access 07/06/22 0219 Arteriovenous fistula Left;Upper Arm 07/06/22  0219  Arm  239                  Const [x]  vital signs above    []  NAD, non-toxic appearance [x]  Chronically ill-appearing, non-distressed        Eyes [x]  Lids normal bilaterally, conjunctiva anicteric and noninjected OU     [] PERRL  [] EOMI  ENMT [x]  Normal appearance of external nose and ears, no nasal discharge        []  MMM, no lesions on lips or gums [x]  No thrush, leukoplakia, oral lesions  [x]  Dentition good []  Edentulous []  Dental caries present  []  Hearing normal  []  TMs with good light reflexes bilaterally         Neck [x]  Neck of normal appearance and trachea midline        []  No thyromegaly, nodules, or tenderness   []  Full neck ROM        Lymph [x]  No LAD in neck     []  No LAD in supraclavicular area     []  No LAD in axillae   []  No LAD in epitrochlear chains     []  No LAD in inguinal areas        CV [x]  RRR            [x]  No peripheral edema     []  Pedal pulses intact   []  No abnormal heart sounds appreciated   []  Extremities WWP   ESM murmur      Resp [x]  Normal WOB at rest    []  No breathlessness with speaking, no coughing  [x]  CTA anteriorly    [x]  CTA posteriorly          GI []  Normal inspection, NTND   []  NABS     []  No umbilical hernia on exam       []  No hepatosplenomegaly     []  Inspection of perineal and perianal areas normal  Transplant surgical site - c/d/I. Soft, nontender, BS - heard      GU []  Normal external genitalia     [] No urinary catheter present in urethra   []  No CVA tenderness    []  No tenderness over renal allograft  Deferred      MSK [x]  No clubbing or cyanosis of hands       []  No vertebral point tenderness  []  No focal tenderness or abnormalities on palpation of joints in RUE, LUE, RLE, or LLE        Skin [x]  No rashes, lesions, or ulcers of visualized skin [x]  Skin warm and dry to palpation         Neuro [x]  Face expression symmetric  []  Sensation to light touch grossly intact throughout    [x]  Moves extremities equally    []  No tremor noted        []  CNs II-XII grossly intact     []  DTRs normal and symmetric throughout [x]  Gait unremarkable        Psych [x]  Appropriate affect       [x]  Fluent speech         [x]  Attentive, good eye contact  []  Oriented to person, place, time          [x]  Judgment and insight are appropriate           Data for Medical Decision Making     (1/2) EKG QTcF 420    I discussed mgm't w/qualified health care professional(s) involved in case: recs with team .    I reviewed CBC results (WBC downtrending.), chemistry results (Creatinine downtrending), micro result(s) (1/02 RPP negative, Bcx still in process), and radiology report(s) (Diffuse airway thickening throughout both lungs with new areas of clustered micronodules in the lower lungs suggesting acute bronchiolitis. Some of the airway inflammation is likely chronic. Large pulmonary artery and cardiac chambers with no  other changes.).    I independently visualized/interpreted CT images (Chest: with bilateral thickening of the airways with few nodular opacities seen in the bilateral lower lobes.).       Recent Labs     Units 03/01/23  1114 03/02/23  0825   WBC 10*9/L 3.3* 2.9*   HGB g/dL 16.0* 10.9*   PLT 32*3/F 228 233   NEUTROABS 10*9/L 2.6  --    LYMPHSABS 10*9/L 0.3*  --    EOSABS 10*9/L 0.0  --    NA mmol/L 141 144   K mmol/L 4.2 4.3   BUN mg/dL 22 23   CREATININE mg/dL 5.73* 2.20*   GLU mg/dL 254 270   CALCIUM mg/dL 62.3* 76.2*   MG mg/dL  --  1.4*   PHOS mg/dL  --  2.7   BILITOT mg/dL 0.4  --    AST U/L 11  --    ALT U/L <7*  --    CRP mg/L 16.0*  --        Lab Results   Component Value Date    CRP 16.0 (H) 03/01/2023    Tacrolimus, Trough 5.5 02/02/2023    Tacrolimus Lvl 5.9 02/16/2023       Microbiology:  Microbiology Results (last day)       Procedure Component Value Date/Time Date/Time    Lower Respiratory Culture [[id number]]     Lab Status: No result Specimen: SPUTUM EXPECTORATED     MRSA Screen [[id number]]     Lab Status: No result Specimen: Nares from Nose     Blood Culture [[id number]] Collected: 03/02/23 0056    Lab Status: In process Specimen: Blood from 1 Peripheral Draw Updated: 03/02/23 0130    Blood Culture [[id number]] Collected: 03/02/23 0057    Lab Status: In process Specimen: Blood from 1 Peripheral Draw Updated: 03/02/23 0130    Respiratory Pathogen Panel [[id number]]  (Normal) Collected: 03/01/23 1048    Lab Status: Final result Specimen: Nasopharyngeal Swab Updated: 03/01/23 1339     Adenovirus Not Detected     Coronavirus HKU1 Not Detected     Coronavirus NL63 Not Detected     Coronavirus 229E Not Detected     Coronavirus OC43 PCR Not Detected     Metapneumovirus Not Detected     Rhinovirus/Enterovirus Not Detected     Influenza A Not Detected     Influenza B Not Detected     Parainfluenza 1 Not Detected     Parainfluenza 2 Not Detected     Parainfluenza 3 Not Detected     Parainfluenza 4 Not Detected     RSV Not Detected     Bordetella pertussis Not Detected     Comment: If B. pertussis/parapertussis infection is suspected, the Bordetella pertussis/parapertussis Qualitative PCR test should be ordered.        Bordetella parapertussis Not Detected     Chlamydophila (Chlamydia) pneumoniae Not Detected     Mycoplasma pneumoniae Not Detected     SARS-CoV-2 PCR Not Detected    Narrative:      This result was obtained using the FDA-cleared [employer] Respiratory 2.1 Panel. Performance characteristics have been established and verified by the Clinical Molecular Microbiology Laboratory, [facility]. This assay does not distinguish between rhinovirus and enterovirus. Lower respiratory specimens will not be tested for Bordetella pertussis/parapertussis. For nasopharyngeal swabs, cross-reactivity may occur between B. pertussis and non-pertussis Bordetella species. All positive B. pertussis results will be automatically confirmed using our in-house PCR assay.  Imaging:  US Renal Transplant W Doppler  Result Date: 03/01/2023  EXAM: US RENAL Glenna Durand ACCESSION: [id number] UN REPORT DATE: 03/01/2023 2:54 PM     CLINICAL INDICATION: 43 years old with elevated creatinine, concern for rejection     COMPARISON: CT abdomen pelvis 09/27/2022, renal transplant ultrasound with Doppler 09/06/2022     TECHNIQUE:  Ultrasound views of the renal transplant were obtained using gray scale and color and spectral Doppler imaging. Views of the urinary bladder were obtained using gray scale and limited color Doppler imaging.     FINDINGS:     TRANSPLANTED KIDNEY: The renal transplant was located in the right lower quadrant. Normal size and echogenicity. Mild dilatation of the renal pelvis and central calyces. 0.4 cm echogenic focus with associated twinkle artifact, which may represent a nonobstructive renal calculus. No solid masses. No perinephric collections identified.     VESSELS: - Perfusion: Using power Doppler, normal perfusion was seen throughout the renal parenchyma. - Resistive indices in the renal transplant are decreased compared with prior examination, however, the resistive indices of the main renal artery remain above normal limits and the resistive indices of the segmental artery are mildly elevated beyond normal limits. - Main renal artery/iliac artery: Patent - Main renal vein/iliac vein: Patent     BLADDER: Unremarkable.         Patent vasculature with mildly elevated resistive indices at the main renal artery anastomosis, improved from prior. Velocities and resistive indices are otherwise unremarkable, and improved from prior.     New mild right hydronephrosis.     Nonobstructing subcentimeter stone within the renal transplant.             Please see below for data measurements:     Transplant location:     Renal Transplant: Sagittal 11.2 cm; AP 4.85 cm; Transverse 8.20 cm     Segmental artery superior resistive index: 0.74 Segmental artery mid resistive index: 0.71 Segmental artery inferior resistive index: 0.69     Previous resistive indices range of segmental arteries: 0.72-0.86     Main renal artery peak systolic velocity at anastomosis: 1.35 m/s Main renal artery hilum resistive index: 0.79 Main renal artery mid resistive index: 0.78 Main renal artery anastomosis resistive index: 0.85     Previous resistive indices range of main renal artery: 0.83-0.9     Main renal vein: patent     Iliac artery: Patent Iliac vein: Patent     Bladder volume prevoid: 147.9  mL                     XR Chest 2 views  Result Date: 03/01/2023  EXAM: XR CHEST 2 VIEWS ACCESSION: [id number] UN REPORT DATE: 03/01/2023 12:22 PM     CLINICAL INDICATION: Cough.     TECHNIQUE: PA and Lateral Chest Radiographs.     COMPARISON: September 07, 2022.     FINDINGS:     The lungs are clear. No pleural effusion or pneumothorax.     Normal cardiomediastinal contour. Renal osteodystrophy.             No acute findings.    ECG 12 Lead  Result Date: 03/01/2023  SINUS RHYTHM WITH PREMATURE ATRIAL BEATS WITH ABERRANT CONDUCTION BIATRIAL ENLARGEMENT PATTERN LEFT VENTRICULAR HYPERTROPHY ( Sokolow-Lyon , Cornell product ) ST & T WAVE ABNORMALITY, CONSIDER INFEROLATERAL ISCHEMIA ABNORMAL ECG WHEN COMPARED WITH ECG OF 28-Sep-2022 10:18, INFERIOR ST & T WAVE ABNORMALITY NOW EVIDENT IN INFERIOR LEADS Confirmed by Lavonna Monarch (  [id number]) on 03/01/2023 11:55:19 AM    CT Chest Wo Contrast  Result Date: 02/27/2023  CT chest     COMPARISON: Chest radiograph 02/27/2023 and chest CT 04/04/2019.     INDICATION: Shortness of breath and cough. History of end-stage renal disease and HIV.     TECHNIQUE: Thin axial noncontrast CT imaging was obtained through the chest. Coronal and sagittal reformatted imaging is reviewed. This CT exam was performed using one or more of the following dose reduction techniques: Automated exposure control, adjustment of the mA and/or kV according to patient size, or use of iterative reconstruction technique.     FINDINGS: There is patchy reticular nodular opacity in both lungs, compatible with pneumonia. There is bronchiectasis in both lower lobes which may reflect sequela of chronic infection or aspiration. No pneumothorax or effusion. Central airways are patent. Heart is mildly enlarged. There are mitral annular calcifications. No pericardial effusion. No thoracic aortic aneurysm. No evidence of lymphadenopathy. 1.6 cm hypodense nodule right thyroid lobe. The visualized kidneys are atrophic. Otherwise unremarkable as are the upper abdomen. Diffusely sclerotic bony structures, probably due to renal osteodystrophy.    1. Patchy reticulonodular opacity in both lungs is compatible with pneumonia. 2. Bronchiectasis in both lower lobes probably reflects sequela of chronic infection or aspiration. 3. No pneumothorax or effusion. 4. No lymphadenopathy. 5. Mild cardiomegaly. Extensive mitral annular calcifications. 6. Renal osteodystrophy. 7. 1.6 cm hypodense nodule visualized right thyroid lobe. Follow-up nonemergent thyroid ultrasound is suggested.     Champ Mungo, MD 02/27/2023 5:08 PM    XR Chest 2 views  Result Date: 02/27/2023  XR CHEST PA AND LATERAL     INDICATION: SOB     COMPARISON: 02/19/2023     TECHNIQUE: Frontal and lateral views of the chest were obtained.     FINDINGS:       LUNGS/PLEURA: Increased perihilar airspace opacities compared to prior study. No pleural effusion or pneumothorax. HEART/MEDIASTINUM: No cardiomegaly. Mediastinal contours are preserved. TUBES/LINES/DEVICES: Surgical clips overlying the neck.. OSSEOUS STRUCTURES: No acute abnormality.        Interval increase in perihilar faint airspace opacities. Recommend correlation for developing pneumonia or edema.     JOSHUA CHO, MD 02/27/2023 3:57 PM      Serologies:  Lab Results   Component Value Date    CMV IGG Positive (A) 09/05/2022    EBV VCA IgG Antibody Positive (A) 09/05/2022    HIV Antigen/Antibody Combo See Comment for Test Results (A) 09/05/2022    Hep A IgG Reactive (A) 01/17/2022    Hep B Surface Ag Nonreactive 09/05/2022    Hep B S Ab Reactive (A) 09/05/2022    Hep B Surf Ab Quant 40.18 (H) 09/05/2022    Hep B Core Total Ab Nonreactive 09/05/2022    Hepatitis C Ab Nonreactive 09/05/2022    RPR Titer 1:8 03/25/2020    RPR Nonreactive 09/05/2022    HSV 1 IgG Positive (A) 09/05/2022    HSV 2 IgG Positive (A) 09/05/2022    Varicella IgG Positive 09/05/2022    Rubella IgG Scr Positive 01/17/2022    Toxoplasma Gondii IgG Negative 01/17/2022    Quantiferon TB Gold Plus Interpretation Negative 01/17/2022    Quantiferon Mitogen Minus Nil 10.00 01/17/2022    Quantiferon Antigen 1 minus Nil 0.00 01/17/2022       Immunizations:  Immunization History   Administered Date(s) Administered    COVID-19 VAC,BIVALENT,MODERNA(BLUE CAP) 10/10/2021    COVID-19 VACCINE,MRNA(MODERNA)(PF) 09/28/2019, 10/26/2019    HEPATITIS B  VACCINE ADULT, ADJUVANTED, IM(HEPLISAV B) 01/16/2020, 03/18/2020, 05/12/2020    HEPATITIS B VACCINE ADULT,IM(ENERGIX B, RECOMBIVAX) 03/30/2015, 01/27/2016    Hepatitis A (Adult) 09/04/2016, 04/26/2017, 10/10/2021    Hepatitis A Vaccine - Unspecified Formulation 09/04/2016, 04/26/2017    Hepatitis B Vaccine, Unspecified Formulation 12/04/1994, 01/09/1995, 06/12/1995    INFLUENZA TIV (TRI) 59MO+ W/ PRESERV (IM) 11/20/2013, 11/11/2014, 03/15/2016, 11/27/2017, 12/11/2018    INFLUENZA TIV (TRI) PF (IM)(HISTORICAL) 03/15/2016    INFLUENZA VACCINE IIV3(IM)(PF)6 MOS UP 12/14/2022    Influenza Vaccine PF(Quad)(Egg Free)18+(Flublok) 12/09/2021    Influenza Vaccine Quad (IIV4 W/PRESERV) 59MO+ 11/01/2016, 11/26/2018    Influenza Vaccine Quad(IM)6 MO-Adult(PF) 11/20/2013, 11/11/2014, 11/29/2020    Influenza Virus Vaccine, unspecified formulation 11/27/2017, 12/11/2018, 12/12/2019, 11/27/2020    MENINGOCOCCAL VACCINE, A,C,Y, W-135(IM)(MENVEO) 07/10/2016, 03/22/2017    MMR 01/09/1995    Meningococcal Conjugate MCV4P 07/10/2016, 03/22/2017    PNEUMOCOCCAL POLYSACCHARIDE 23-VALENT 11/20/2013, 11/20/2013, 11/26/2018, 11/26/2018    Pneumococcal Conjugate 13-Valent 08/28/2017, 08/28/2017, 12/12/2019    TD(TDVAX),ADSORBED,2LF(IM)(PF) 06/12/1995    TdaP 03/08/2018, 03/08/2018

## 2023-03-02 NOTE — Unmapped (Signed)
Nephrology ([facility]) History & Physical    Assessment & Plan:   Christian Brown is a 43 y.o. male whose presentation is complicated by history of HIV, HIV nephropathy, FSGS, DDKT in 08/2022, HTN, OSA, hyperparathyroidism, chronic DVT of LLE femoral vein, asthma, and anemia that presented to [facility] with pneumonia and AKI.     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)    HIV nephropathy (CMS-HCC)    Chronic deep vein thrombosis (DVT) of femoral vein of left lower extremity (CMS-HCC)    CAP (community acquired pneumonia)    Mild intermittent asthma without complication    Pulmonary hypertension (CMS-HCC)    Iron deficiency anemia    History of DVT (deep vein thrombosis)    Anemia due to stage 4 chronic kidney disease (CMS-HCC)    Secondary renal hyperparathyroidism (CMS-HCC)    Hypercalcemia due to chronic kidney disease      Active Problems    AKI - 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 Follows outpatient with transplant nephrologist [name] (most recently seen in clinic on 02/02/2023). On current immunosuppression with tacrolimus, azathioprine, and prednisone. Initially on MMF, but did not tolerate this due to pill esophagitis/gastitis. Baseline creatinine is 1.5-1.7, elevated on admission to 2.47. Patient was seen on 02/27/2023 at [facility] ED, with creatinine at the time elevated to 2.88. He then left AMA to present to the [facility] ED for ongoing evaluation and care. Here in the ED, workup was significant for UA showing rare bacteria & mucus. Creatinine is 2.47 on 03/01/2023. Renal transplant ultrasound showed new mild right hydronephrosis along with mildly elevated resistive indices at the main renal artery anastamosis, improved from previous study. Received 1L of LR. Differential diagnosis of his AKI includes acute renal transplant rejection, prerenal AKI due to dehydration/hypercalcemia, contrast vs medication-induced nephropathy (though creatinine was already elevated at OSH prior to CT scan and ceftriaxone/vancomycin administration). Given immunocompromised status and history of HIV, will also evaluate for additional infectious causes of renal dysfunction including CMV, EBV, BK Virus, and Syphilis.   - avoid nephrotoxic agents   - daily BMP+Mg+Phos  - strict I/Os  - follow up 03/01/2023 Urine culture   - follow up CMV  - follow up EBV  - follow up BK virus   - follow up RPR  - follow up HLA DSA  - consider additional IV fluid administration pending AM BMP if creatinine worsens or fails to improve   - continue tacrolimus 10mg  PO BID  - continue azathioprine 105mg  PO daily   - continue prednisone 5mg  PO daily   - AM tacrolimus trough (goal level is 6-8 ng/mL)  - hypercalcemia management as detailed below     Presumed Community Acquired Pneumonia - At Risk for PJP Pneumonia  Patient presents with one month of shortness of breath along with productive cough. 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 Presented for care at [facility] on 02/26/2022 and was treated with vancomycin & cefepime. He then presented to care at [facility] for ongoing management of his pneumonia, along with AKI.  Chest CT at OSH was significant for bilateral patchy reticular opacities consistent with pneumonia. In the [facility] ED on 03/01/2023, workup was significant for stable leukopenia (WBC 3.3), elevated inflammatory markers (ESR 70, CRP 16). Negative RPP. Normal lactate.Afebrile here and stable on room air.  No acute findings on CXR.  Ddx community-acquired vs PJP pneumonia. S/p one dose of CTX in the ED for CAP coverage. Antimicrobial agent of choice for PJP pneumonia coverage would be SMX-TMP, but is listed as a medication allergy for this patient (rash). Patient is also unable to tolerate pentamidine (caused bronchospasms in July 2024, requiring multiple doses of albuterol) or dapsone (cytopenia).  - in AM, discuss ongoing antimicrobial coverage recommendations with ICID   - consider bronchoscopy to obtain sample for definitive diagnosis of PJP pneumonia   - follow up 03/02/2023 blood cultures  - daily CBC  - daily BMP    HIV (on ARVT) - Immunosuppression - Hx CMV, Syphilis, PJP  Diagnosed with HIV in September 2015. Follows with outpatient ID Dr. [name] in [place]. Has been treated with Biktarvy. HIV undetected when checked on 10/27/2022. Viral load has been <20 since 2018. For CMV prophylaxis, patient chose to do preemptive monitoring and has never taken Valcyte. Last CMV PCR was detectable at <200 on 01/29/2023. For PJP prophylaxis, he previously has not tolerated Bactrim, pentamadine, or dapsone. Has been prescribed Atovaquone with strong recommendation for compliance with this medication, but was not taking it as of his last visit with his transplant nephrologist ([name]) on 02/02/2023. Given immunocompromised status along with history of 1 month of productive cough, patient is at risk for PJP pneumonia and will need further evaluation for this during his admission.    - ICID consult (page in AM), appreciate recs  - follow up CMV PCR  - continue home Biktarvy 1 tablet PO daily     Hypercalcemia - Secondary Renal Hyperparathyroidism  In setting of acute kidney injury, hyperparathyroidism. PTH was elevated to 2182 on 12/14/2022. Received 1L of LR in the ED.   - daily BMP+Mg+Phos  - check iCal  - monitor I/Os  - consider additional IVF bolus    HTN  Goal BP is <130/80. In the past, patient has not tolerated amlodipine, hydralazine, metoprolol, labetalol. Losartan held at OSH due to hyperkalemia (K 5.4). Potassium wnl in ED. BP on admission has averaged 128-173 / 86-125. No hypertensive symptoms at this time.  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 Fistula thrombosis is s/p excision with Transplant Surgery in May 2024. With history of shortness of breath, cough, and chronic DVT, will need to rule out PE. EKG in ED shows sinus rhythm with PACs and LVH, as well as ST&T wave abnormality. Reassuringly, he denies any current chest pain. Troponin at OSH was 29 on 02/27/2023, increased to 60 on 03/02/2023 recheck. Pro-BNP from 03/01/2023 was elevated at 4342 (no previous comparison). Given patient's AKI currently concerning for acute renal transplant rejection, will defer CTA chest to rule out PE at this time. However, if he does develop a new oxygen requirement, tachycardia, pleuritic chest pain, or any other signs/symptoms concerning for pulmonary  embolism, would need to consider empiric heparin vs CTA chest or V/Q scan at that time.   - recheck Troponin with AM labs   - dvt ppx with heparin 5000u q8h  - Bilateral LE PVLs    Chronic Problems    Anemia of Chronic Kidney Disease   Baseline Hgb 12-13, 11.9 on admission.   - daily CBC     Mild Intermittent Asthma - OSA  Uses albuterol inhaler as needed at home. Diagnosed with severe obstructive sleep apnea following 10/21/2019 sleep study. Does not use a CPAP at night. Stable on room air since admission.   - home albuterol PRN     Issues Impacting Complexity of Management:  -Intensive monitoring of drug toxicity from Tacrolimus with scheduled levels and Azathioprine with scheduled BMP and levels  -The patient is at high risk for the development of complications of volume overload due to the need to provide IV hydration for suspected hypovolemia in the setting of: AKI s/p renal transplant  -The patient is at high risk of complications from immunosuppression    Medical Decision Making: Reviewed records from the following unique sources  ED & OSH records.    Checklist:  Diet: Regular Diet  DVT PPx: Heparin 5000units q8h  Code Status: Full Code  Dispo: Patient appropriate for Inpatient based on expectation of ongoing need for hospitalization greater than two midnights based on severity of presentation/services including management for pneumonia and AKI in immunocompromised patient.    Team Contact Information:   Primary Team: Nephrology (MEDB)  Primary Resident: Alvino Chapel, MD  Resident's Pager: [phone] (Nephrology Senior Resident)    Chief Concern:   Acute kidney injury superimposed on chronic kidney disease (CMS-HCC)    Subjective:   Christian Brown is a 43 y.o. male with pertinent PMHx of HIV, HIV nephropathy, FSGS, DDKT in 08/2022, HTN, OSA, hyperparathyroidism, chronic DVT of LLE femoral vein, asthma, and anemia presenting with one month of productive cough an shortness of breath, as well as AKI found during recent OSH ED visit.     History obtained by patient.     HPI:  Christian Brown presents with elevated creatinine levels concerning for acute rejection of his kidney transplant which will require inpatient management. He has a history of focal segmental glomerulosclerosis, for which he ultimately received a deceased donor kidney transplant at [facility] on 09/05/2022. Current immunosuppressions regimen at home includes tacrolimus, azathioprine, and prednisone.     Urine output was decreased compared to his usual amount a few days ago, but he reports being back to his normal baseline urine output at this time. No back pain or burning with urination.     Over the past month, he has been experiencing shortness of breath with a productive cough. Had a fever at illness onset (tmax 101), but has been afebrile for the past two weeks. No chest pain, nausea, vomiting, diarrhea, new rashes.    Was seen at Urgent Care in [place] on 02/18/2022- CXR at this visit showed no acute airspace process & he was treated with a DuoNeb treatment. He uses his albuterol inhaler daily. Has never used a CPAP at home.   He presented for care at [facility] on 02/27/2023. While there:   - febrile to 101.2   - did not require supplemental O2   - found to have AKI with Cr elevated to 2.88   - stable leukopenia with WBC 3.0  - received empiric abx treatment with vancomycin &  cefepime  - eft AMA to transition his care to [facility]  Chest CT from [facility] on 02/27/2023:   1. Patchy reticulonodular opacity in both lungs is compatible with pneumonia.   2. Bronchiectasis in both lower lobes probably reflects sequela of chronic infection or aspiration.   3. No pneumothorax or effusion.   4. No lymphadenopathy.   5. Mild cardiomegaly. Extensive mitral annular calcifications.   6. Renal osteodystrophy.   7. 1.6 cm hypodense nodule visualized right thyroid lobe. Follow-up nonemergent thyroid ultrasound is suggested.     Reports that he takes all his medications as prescribed.     In the ED:  Vitals:   - Temp afebrile  - HR 67-91  - BP 128-146 / 86-94  - RR 18, nl  - SpO2 of 100% on RA  Labs:  - CBC: leukopenia (WBC 3.3), anemia (hgb 11.9), nl plt; neutrophil left shift   - elevated inflammatory markers (ESR 70, CRP 16)  - CMP: elevated Cr (2.47), hypercalcemia (Ca 11.8), elevated alk phos (399)  - normal lactate 1.2  - negative RPP   - UA with rare bacteria & rare mucus   Cultures:   - N/A  EKG:   - Sinus rhythm   - PACs, aberrant conduction, LV hypertrophy, bi-atrial enlargement   - ST & T wave abnormality, consider inferolateral ischemia   Imaging:   - CXR: no acute findings   - renal transplant [place]: Patent vasculature with mildly elevated resistive indices at the main renal artery anastomosis, improved from prior. Velocities and resistive indices are otherwise unremarkable, and improved from prior. - New mild right hydronephrosis. - Nonobstructing subcentimeter stone within the renal transplant.  - chest CT (from 02/27/2023):   Interventions:   - transplant nephrology consult   - ceftriaxone 2g IV once   - LR  - azathioprine 150mg  PO (ordered, not yet given)   - prednisone 5mg  PO (ordered, not yet given)   - tacrolimus 10mg  PO BID (ordered, not yet given)     Pertinent Surgical Hx   has a past surgical history that includes Pericardial window; Intussusception repair; Umbilical hernia repair; pr explore parathyroid glands (Bilateral, 06/17/2019); pr autotransplant, parathyroid (Bilateral, 06/17/2019); pr right heart cath o2 saturation & cardiac output (N/A, 07/20/2020); pr repair of nasal septum (N/A, 10/22/2020); pr excision turbinate,submucous (Bilateral, 10/22/2020); pr revise av fistula,w/o thrombectomy (Left, 06/06/2022); pr revise av fistula,w/o thrombectomy (Left, 07/05/2022); pr creat av fistula,non-autogenous graft (Left, 07/05/2022); pr upper gi endoscopy,biopsy (N/A, 07/31/2022); pr transplantation of kidney (N/A, 09/05/2022); pr transplant,prep renal graft/arterial (N/A, 09/05/2022); and pr upper gi endoscopy,biopsy (N/A, 09/12/2022).     Pertinent Family Hx  family history includes Arthritis in his mother; Hypertension in his father and mother.     Pertinent Social Hx    reports that he has never smoked. He has never used smokeless tobacco. He reports that he does not currently use alcohol. He reports that he does not use drugs.     Allergies  Amlodipine, Doxercalciferol, Fish containing products, Iron sucrose, Labetalol, Metoclopramide, Metoprolol, Nifedipine, Other, Sulfamethoxazole-trimethoprim, Vancomycin, Calcitonin, Peanut oil, Potassium clavulanate, Azathioprine, Clindamycin, Hydralazine, and Sensipar [cinacalcet]    I reviewed the Medication List. The current list is Accurate  Prior to Admission medications    Medication Dose, Route, Frequency   albuterol HFA 90 mcg/actuation inhaler Inhale 1-2 puffs every four (4) to six (6) hours as needed for wheezing or shortness of breath.   azathioprine (AZASAN) 100 mg tablet  100 mg, Oral, Daily (standard), Take in addition to 50 mg tablet for a total daily dose of 150 mg daily   azathioprine (IMURAN) 50 mg tablet 50 mg, Oral, Daily (standard), Take in addition to 100 mg tablet for a total daily dose to of 150 mg daily   bictegrav-emtricit-tenofov ala (BIKTARVY) 50-200-25 mg tablet 1 tablet, Oral, Every morning   carvedilol (COREG) 12.5 MG tablet 12.5 mg, Oral, 2 times a day (standard)   DIETARY SUPPLEMENT,MISC COMB14 ORAL Taking magnesium gummy supplement once daily. Dosage strength & product unknown    diphenhydrAMINE (BENADRYL) 25 mg capsule 25 mg, Oral, Every 6 hours PRN   empty container Misc Use as directed with Zarxio   filgrastim-sndz (ZARXIO) 480 mcg/0.8 mL Syrg 480 mcg, Subcutaneous, Weekly, Administer only as directed by transplant team.   HYDROcodone-acetaminophen (NORCO) 5-325 mg per tablet 1 tablet, Oral, Daily PRN   pantoprazole (PROTONIX) 40 MG tablet 40 mg, Oral, 2 times a day (standard)   predniSONE (DELTASONE) 5 MG tablet 5 mg, Oral, Daily   tacrolimus (PROGRAF) 5 MG capsule 10 mg, Oral, 2 times a day       Librarian, academic:  Christian Brown currently has decisional capacity for healthcare decision-making and is able to designate a surrogate healthcare decision maker. Christian Brown designated healthcare decision maker(s) is [name] (the patient's parent) as denoted by stated patient preference.    Objective:   Physical Exam:  Temp:  [36.5 ??C (97.7 ??F)-37.1 ??C (98.8 ??F)] 36.5 ??C (97.7 ??F)  Heart Rate:  [67-91] 85  SpO2 Pulse:  [89] 89  Resp:  [18] 18  BP: (128-173)/(86-124) 173/99  SpO2:  [97 %-100 %] 97 %    Gen: NAD, converses easily   Eyes: Sclera anicteric, EOMI grossly normal   HENT: Atraumatic, normocephalic  Neck: Trachea midline  Heart: RRR  Lungs: CTAB, no crackles or wheezes. Normal work of breathing on room air  Abdomen: Soft, NTND  Extremities: No peripheral extremity edema  Neuro: Grossly symmetric, non-focal    Skin:  No rashes, lesions on clothed exam  Psych: Alert, oriented to person, place, time, & situation   ________________________________  Alvino Chapel, MD   March 01, 2023 7:56 PM

## 2023-03-02 NOTE — Unmapped (Signed)
 VENOUS ACCESS TEAM PROCEDURE    Nurse request was placed for a PIV by Venous Access Team (VAT).  Patient was assessed at bedside for placement of a PIV. PPE were donned per protocol.  Access was obtained. Blood return noted.  Dressing intact and device well secured.  Flushed with normal saline.  See LDA for details.  Pt advised to inform RN of any s/s of discomfort at the PIV site.    Workup / Procedure Time:  30 minutes       Care RN was notified.       Thank you,     Suezanne Jacquet, RN Venous Access Team

## 2023-03-02 NOTE — Unmapped (Addendum)
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 Follows with transplant nephrologist [name] (last seen 02/02/2023). Baseline creatinine is 1.5-1.7, elevated on admission to 2.47. Seen 02/27/2023 at [facility] ED, with creatinine at  2.88 and left to come sooner to [facility] ED for ongoing evaluation and care. UA with rare bacteria and mucus, no protein. Renal transplant ultrasound with new mild right hydronephrosis w/ mildly elevated resistive indices improved from previous study. With unknown cause for AKI, a renal biopsy of transplant kidney was completed on 1/7. HLA DSA reassuringly negative. 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 A few non scared area have mild tubulitis not associated with CaPO4 deposits so cannot excluded concurrent TCMR. Thus decision was made to treat as acute transplant rejection with high dose steroids followed by prednisone taper. Also had parathyroidectomy done on 03/14/23 with improvements in Ca levels. Cr improved on discharge. Continued home tacrolimus 10mg  PO BID & azathioprine 150 mg PO daily.     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 PTH was elevated to 2182 on 12/14/2022. PTH from 03/02/23 of 1795. Received 1L of LR in the ED. Considered granulomatous disease, tertiary hyperparathyroidism, underlying worsening dehydration, malignancy. Considered cinacalet for him as well, but endorsed having an adverse reaction to it. Ca-125 WNL. ACE WNL. Thus symptoms likely related to known parathyroid disease. Patient with persistently elevated, but relatively stable serum Ca & ionized Ca. Underwent parathyroidectomy for final parathyroid gland with Dr. Selena Batten on 1/15 without complication.     C/f Multifocal Pneumonia - Hx of PJP Pneumonia - Productive Cough   One month of shortness of breath along with productive cough. 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 Presented [facility] on 02/27/2023 and was treated with vancomycin & cefepime. He then presented to [facility]. Chest CT at OSH was significant for bilateral patchy reticular opacities consistent with pneumonia. Found to have an elevated inflammatory markers (ESR 70, CRP 16). Negative RPP. Normal lactate. Afebrile here, but did endorse subjective fevers at home, and was having productive cough with yellow sputum. Reassuring stable on room air.  No acute findings on CXR. Considering HCAP, fungal vs PJP pneumonia, vs another abnormal granulomatous disease. ICID consulted and seemingly more consistent with underlying pneumonia and improved with levofloxacin. Induced sputum for PJP negative. Symptoms resolved on discharge.       HIV (on ARVT) - Immunosuppression - Hx CMV, Syphilis, PJP  Diagnosed w/ HIV 10/2013. Follows with outpatient ID Dr. [name] in [place]. On Biktarvy. HIV undetected when checked on 10/27/2022. Viral load has been <20 since 2018. For CMV prophylaxis, patient chose to do preemptive monitoring and has never taken Valcyte. Last CMV PCR was detectable at <200 on 01/29/2023. For PJP prophylaxis, he previously has not tolerated Bactrim, pentamadine, or dapsone. Has been prescribed Atovaquone with strong recommendation for compliance with this medication, but was not taking it as of his last visit with his transplant nephrologist ([name]) on 02/02/2023. CD4 of 75 thought to be due thymoglobulin +  immunosuppression. Continued home Biktarvy 1 tablet PO daily.     Chest Pain - C/f Demand Ischemia   Patient complained of constant chest pain on left side of chest that didn't radiate to jaw/left arm. Stated it was worse when he lays down. Previously occurred when patient was hypotensive. BP 111/65 at time of evaluation. Discussed obtaining EKG, however patient refused. He stated he understood that we could be missing potential cardiac dysfunction, including STEMI, if he refused the EKG. Troponin 182 - 164 and thus likely consistent with demand ischemia. Patient's pain resolved without additional intervention.     HTN Urgency  Noted that Goal BP is <130/80. In the past, patient has not tolerated amlodipine, hydralazine, metoprolol, labetalol. Losartan held at OSH due to hyperkalemia (K 5.4). Did have one pressure reading with a SBP >200 ,and increased home carvedilol 12.5mg  PO BID to 25 mg BID. Patient still with elevated BP and thus minoxidil 2.5 mg daily as patient previously tolerated well.      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 Fistula thrombosis is s/p excision with Transplant Surgery in May 2024. With history can consider PE. No chest pain, however held on CTA with AKI. Hematology has been consulted in the past. PVL's with no acute clot, but left thigh showing signs of chronic obstruction.        Chronic Problems  Anemia of Chronic Kidney Disease : Baseline Hgb 12-13, 11.9 on admission. B12, folate w/n limits. Monitored with Daily CBC.   Mild Intermittent Asthma - OSA: Uses albuterol inhaler as needed at home. Diagnosed with severe obstructive sleep apnea following 10/21/2019 sleep study. Does not use a CPAP at night. Stable on room air since admission. Continued albuterol PRN

## 2023-03-02 NOTE — Unmapped (Addendum)
Tacrolimus Therapeutic Monitoring Pharmacy Note    Christian Brown is a 43 y.o. male continuing tacrolimus.     Indication: Kidney transplant     Date of Transplant:  09/05/22       Prior Dosing Information: Home regimen tacrolimus 10 mg BID      Source(s) of information used to determine prior to admission dosing: Clinic Note    Goals:  Therapeutic Drug Levels  Tacrolimus trough goal:  6-8 ng/ml    Additional Clinical Monitoring/Outcomes  Monitor renal function (SCr and urine output) and liver function (LFTs)  Monitor for signs/symptoms of adverse events (e.g., hyperglycemia, hyperkalemia, hypomagnesemia, hypertension, headache, tremor)    Results:   Tacrolimus level:  4.8 ng/mL, drawn appropriately    Pharmacokinetic Considerations and Significant Drug Interactions:  Concurrent hepatotoxic medications: None identified  Concurrent CYP3A4 substrates/inhibitors: None identified  Concurrent nephrotoxic medications: None identified    Assessment/Plan:  Recommendedation(s)  Increase to tacrolimus 11 mg BID    Follow-up  Daily levels with AM labs .   A pharmacist will continue to monitor and recommend levels as appropriate    Please page service pharmacist with questions/clarifications.    Lurline Del, PharmD

## 2023-03-02 NOTE — Unmapped (Signed)
Bed: 04-P  Expected date:   Expected time:   Means of arrival:   Comments:  TEAM C PT

## 2023-03-02 NOTE — Unmapped (Signed)
Problem: Adult Inpatient Plan of Care  Goal: Plan of Care Review  03/02/2023 0512 by Raoul Pitch, RN  Outcome: Progressing  03/02/2023 0510 by Raoul Pitch, RN  Outcome: Progressing  Goal: Patient-Specific Goal (Individualized)  Outcome: Progressing  Goal: Absence of Hospital-Acquired Illness or Injury  Outcome: Progressing  Goal: Optimal Comfort and Wellbeing  Outcome: Progressing  Goal: Readiness for Transition of Care  Outcome: Progressing  Intervention: Mutually Develop Transition Plan  Recent Flowsheet Documentation  Taken 03/02/2023 0342 by Raoul Pitch, RN  Transportation Anticipated: family or friend will provide  Goal: Rounds/Family Conference  Outcome: Progressing     Problem: Infection  Goal: Absence of Infection Signs and Symptoms  Outcome: Progressing     Problem: HIV/AIDS Infection  Goal: HIV/AIDS Symptom Control  Outcome: Progressing

## 2023-03-02 NOTE — Unmapped (Signed)
Nephrology ([facility]B) Progress Note    Assessment & Plan:   Christian Brown is a 43 y.o. male whose presentation is complicated by HIV w/associated nephropathy, FSGS, DDKT 08/2022, HTN, OSA, hyperparathyroidism, chronic DVT of LLE femoral vein, asthma, and anemia that presented to [facility] with pneumonia and AKI.      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)    HIV nephropathy (CMS-HCC)    Chronic deep vein thrombosis (DVT) of femoral vein of left lower extremity (CMS-HCC)    CAP (community acquired pneumonia)    Mild intermittent asthma without complication    Pulmonary hypertension (CMS-HCC)    Iron deficiency anemia    History of DVT (deep vein thrombosis)    Anemia due to stage 4 chronic kidney disease (CMS-HCC)    Secondary renal hyperparathyroidism (CMS-HCC)    Hypercalcemia due to chronic kidney disease    Hemodialysis AV fistula thrombosis (CMS-HCC)      Active Problems    Acute Kidney Injury - DDKT (09/05/2022) - Hx FSGS & HIV Nephropathy  Previously on dialysis, now s/p 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 Follows with transplant nephrologist Dr. Elvera Brown (last seen 02/02/2023). Baseline creatinine is 1.5-1.7, elevated on admission to 2.47. Seen 02/27/2023 at [name], with creatinine at  2.88 and left to come sooner to [name] ED for ongoing evaluation and care. UA with rare bacteria and mucus, no protein. Renal transplant ultrasound with new mild right hydronephrosis w/ mildly elevated resistive indices improved from previous study. Received 1L of LR. AKI differential including acute renal transplant rejection, prerenal AKI due to dehydration/hypercalcemia, contrast vs medication-induced nephropathy (Cr already elevated at OSH prior to CT scan and ceftriaxone/vancomycin administration). Seemingly hypovolemia and hypercalemia given clinical status. With immunocompromised state HIV, CMV, EBV, BK Virus, and Syphilis on differential as well.   - avoid nephrotoxic agents   - daily renal function panel   - follow up 03/01/2023 Urine culture   - follow up CMV, EBV, BK virus, RPR  - follow up HLA DSA  - SPEP/IFE, bladder scan   - giving more NS to combat hypercalcemia belelow  - continue home immunosuppression with tacrolimus 10mg  PO BID, azathioprine 105mg  PO daily, and prednisone 5mg  PO daily  (initially on MMF, but stopped 2/2 pill esophagitis/gastitis)  - AM tacrolimus trough (goal level is 6-8 ng/mL)    C/f Multifocal Pneumonia - Hx of PJP Pneumonia - Productive Cough   One month of shortness of breath along with productive cough. 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 Presented [name] on 02/27/2023 and was treated with vancomycin & cefepime. He then presented to [name]. Chest CT at OSH was significant for bilateral patchy reticular opacities consistent with pneumonia. Found to have an elevated inflammatory markers (ESR 70, CRP 16). Negative RPP. Normal lactate. Afebrile here, but did endorse subjective fevers at home, and is having productive cough with yellow sputum. Reassuring stable on room air.  No acute findings on CXR. Considering HCAP, fungal vs PJP pneumonia, vs another abnormal granulomatous disease. S/p one dose of CTX in the ED for CAP coverage.  - ICID consulted appreciate recs  - treated or HCAP w/ zosyn + azithromycin --> levofloxacin 03/02/23  -  starting atovaquone for PJP prophyalxis (has allergy to bactrim w/ rash, and pentamidine cause bronchospasm, and dapsone/cytopenias)  - LRCX  - induced sputum production for PJP PCR  - fungitell assay,galactomannan ordered     HIV (on ARVT) - Immunosuppression - Hx CMV, Syphilis, PJP  Diagnosed w/ HIV 10/2013. Follows with outpatient ID Christian Brown in [place]. On Biktarvy. HIV undetected when checked on 10/27/2022. Viral load has been <20 since 2018. For CMV prophylaxis, patient chose to do preemptive monitoring and has never taken Valcyte. Last CMV PCR was detectable at <200 on 01/29/2023. For PJP prophylaxis, he previously has not tolerated Bactrim, pentamadine, or dapsone. Has been prescribed Atovaquone with strong recommendation for compliance with this medication, but was not taking it as of his last visit with his transplant nephrologist (Christian Brown) on 02/02/2023.   - continue home Biktarvy 1 tablet PO daily      Hypercalcemia - Secondary Renal Hyperparathyroidism  In setting of acute kidney injury, hyperparathyroidism. PTH was elevated to 2182 on 12/14/2022. PTH from 03/02/23 of 1795. Received 1L of LR in the ED. States he is supposed to have surgery upcoming for possible hyperactive parathyroid gland. Can consider granulomatous disease, tertiary hyperparathyroidism, underlying worsening dehydration, malignancy.   - daily BMP+Mg+Phos  - giving normal saline 150 ml/hr  - BID BMP, ionized calcium   - serum ACE, CA125, workup of infection above, vitamin D levels, SPEP/IFE      HTN: noted that Goal BP is <130/80. In the past, patient has not tolerated amlodipine, hydralazine, metoprolol, labetalol. Losartan held at OSH due to hyperkalemia (K 5.4).   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 Fistula thrombosis is s/p excision with Transplant Surgery in May 2024. With history can consider PE. No chest pain, however held on CTA with AKI.   - Bilateral LE PVLs to assess for possible clot  - echocardiogram for RV strain?    Troponin elevation - Elevated ProBNP  Noted to have a troponin at OSH of 29 from 02/27/2023. Has a hstrop 60 on 03/02/2023 recheck. Pro-BNP from 03/01/2023 was elevated at 4342 (no previous comparison). Denies any chest pain. EKG not consistent with acute ischemic changes. Likely some demand from dehydration. Of note these levels are lower than they have been in the past.   - trending troponin to peak  - echocardiogram ordered    Chronic Problems  Anemia of Chronic Kidney Disease : Baseline Hgb 12-13, 11.9 on admission. Monitoring with daily CBC   Mild Intermittent Asthma - OSA: Uses albuterol inhaler as needed at home. Diagnosed with severe obstructive sleep apnea following 10/21/2019 sleep study. Does not use a CPAP at night. Stable on room air since admission. Continue albuterol PRN    Issues Impacting Complexity of Management:  -High risk of complications from pain and/or analgesia likely to result in delirium  -The patient is at high risk for the development of complications of volume overload due to the need to provide IV hydration for suspected hypovolemia in the setting of: stage IV or greater CKD  -The patient is at high risk of complications from hypercalcemia, infection in immunocompromised state    Medical Decision Making: Reviewed  records from the following unique sources  EKG, echo, previous outpatient documentation, H&P.    Daily Checklist:  Diet: Regular Diet  DVT PPx: Heparin 5000units q8h  Electrolytes: Replete Potassium to >/= 3.6 and Magnesium to >/= 1.8  Code Status: Full Code  Dispo:  Continue plan of care on floor    Team Contact Information:   Primary Team: Nephrology (MEDB)  Primary Resident: Julious Payer, MD, MD  Resident's Pager: [phone] (Nephrology Senior Resident)    Interval History:   No acute events overnight.    Feels okay, not short of breath but does endorse cough that can be productive. No change in how he is feeling for right now. Denies any chest pain or fevers/chills for right now. No diarrhea. No abdominal pain.     Objective:   Temp:  [36.5 ??C (97.7 ??F)] 36.5 ??C (97.7 ??F)  Heart Rate:  [43-85] 81  SpO2 Pulse:  [89] 89  Resp:  [18-21] 20  BP: (128-173)/(99-125) 128/99  SpO2:  [97 %-99 %] 97 %    Gen: NAD, converses appropriately  HENT: atraumatic, normocephalic currently.   Heart: RRR  Lungs: CTAB, no crackles or wheezes  Abdomen: soft, NTND  Extremities: No edema, fistula clotted from LUE, thrill felt from fistula of left lower forearm

## 2023-03-03 LAB — CBC W/ AUTO DIFF
BASOPHILS ABSOLUTE COUNT: 0 10*9/L (ref 0.0–0.1)
BASOPHILS RELATIVE PERCENT: 0.4 %
EOSINOPHILS ABSOLUTE COUNT: 0 10*9/L (ref 0.0–0.5)
EOSINOPHILS RELATIVE PERCENT: 0.4 %
HEMATOCRIT: 29 % — ABNORMAL LOW (ref 39.0–48.0)
HEMOGLOBIN: 10 g/dL — ABNORMAL LOW (ref 12.9–16.5)
LYMPHOCYTES ABSOLUTE COUNT: 0.5 10*9/L — ABNORMAL LOW (ref 1.1–3.6)
LYMPHOCYTES RELATIVE PERCENT: 19.4 %
MEAN CORPUSCULAR HEMOGLOBIN CONC: 34.7 g/dL (ref 32.0–36.0)
MEAN CORPUSCULAR HEMOGLOBIN: 36.4 pg — ABNORMAL HIGH (ref 25.9–32.4)
MEAN CORPUSCULAR VOLUME: 104.9 fL — ABNORMAL HIGH (ref 77.6–95.7)
MEAN PLATELET VOLUME: 7.8 fL (ref 6.8–10.7)
MONOCYTES ABSOLUTE COUNT: 0.3 10*9/L (ref 0.3–0.8)
MONOCYTES RELATIVE PERCENT: 12.6 %
NEUTROPHILS ABSOLUTE COUNT: 1.6 10*9/L — ABNORMAL LOW (ref 1.8–7.8)
NEUTROPHILS RELATIVE PERCENT: 67.2 %
PLATELET COUNT: 204 10*9/L (ref 150–450)
RED BLOOD CELL COUNT: 2.76 10*12/L — ABNORMAL LOW (ref 4.26–5.60)
RED CELL DISTRIBUTION WIDTH: 14.6 % (ref 12.2–15.2)
WBC ADJUSTED: 2.4 10*9/L — ABNORMAL LOW (ref 3.6–11.2)

## 2023-03-03 LAB — BASIC METABOLIC PANEL
ANION GAP: 11 mmol/L (ref 5–14)
ANION GAP: 11 mmol/L (ref 5–14)
BLOOD UREA NITROGEN: 23 mg/dL (ref 9–23)
BLOOD UREA NITROGEN: 22 mg/dL (ref 9–23)
BUN / CREAT RATIO: 9
BUN / CREAT RATIO: 8
CALCIUM: 12 mg/dL — ABNORMAL HIGH (ref 8.7–10.4)
CALCIUM: 11.7 mg/dL — ABNORMAL HIGH (ref 8.7–10.4)
CHLORIDE: 110 mmol/L — ABNORMAL HIGH (ref 98–107)
CHLORIDE: 113 mmol/L — ABNORMAL HIGH (ref 98–107)
CO2: 23 mmol/L (ref 20.0–31.0)
CO2: 22 mmol/L (ref 20.0–31.0)
CREATININE: 2.44 mg/dL — ABNORMAL HIGH (ref 0.73–1.18)
CREATININE: 2.6 mg/dL — ABNORMAL HIGH (ref 0.73–1.18)
EGFR CKD-EPI (2021) MALE: 33 mL/min/{1.73_m2} — ABNORMAL LOW (ref >=60)
EGFR CKD-EPI (2021) MALE: 31 mL/min/{1.73_m2} — ABNORMAL LOW (ref >=60)
GLUCOSE RANDOM: 85 mg/dL (ref 70–179)
GLUCOSE RANDOM: 121 mg/dL (ref 70–179)
POTASSIUM: 4.1 mmol/L (ref 3.4–4.8)
POTASSIUM: 4 mmol/L (ref 3.4–4.8)
SODIUM: 144 mmol/L (ref 135–145)
SODIUM: 146 mmol/L — ABNORMAL HIGH (ref 135–145)

## 2023-03-03 LAB — TACROLIMUS LEVEL, TROUGH: TACROLIMUS, TROUGH: 5 ng/mL (ref 5.0–15.0)

## 2023-03-03 LAB — CBC
HEMATOCRIT: 32.3 % — ABNORMAL LOW (ref 39.0–48.0)
HEMOGLOBIN: 10.9 g/dL — ABNORMAL LOW (ref 12.9–16.5)
MEAN CORPUSCULAR HEMOGLOBIN CONC: 33.6 g/dL (ref 32.0–36.0)
MEAN CORPUSCULAR HEMOGLOBIN: 35.5 pg — ABNORMAL HIGH (ref 25.9–32.4)
MEAN CORPUSCULAR VOLUME: 105.6 fL — ABNORMAL HIGH (ref 77.6–95.7)
MEAN PLATELET VOLUME: 8.3 fL (ref 6.8–10.7)
PLATELET COUNT: 240 10*9/L (ref 150–450)
RED BLOOD CELL COUNT: 3.06 10*12/L — ABNORMAL LOW (ref 4.26–5.60)
RED CELL DISTRIBUTION WIDTH: 14.7 % (ref 12.2–15.2)
WBC ADJUSTED: 2.7 10*9/L — ABNORMAL LOW (ref 3.6–11.2)

## 2023-03-03 LAB — MAGNESIUM: MAGNESIUM: 1.6 mg/dL (ref 1.6–2.6)

## 2023-03-03 LAB — IONIZED CALCIUM VENOUS
CALCIUM IONIZED VENOUS (MG/DL): 6.76 mg/dL (ref 4.40–5.40)
CALCIUM IONIZED VENOUS (MG/DL): 6.46 mg/dL — ABNORMAL HIGH (ref 4.40–5.40)

## 2023-03-03 LAB — EBV QUANTITATIVE PCR, BLOOD: EBV VIRAL LOAD RESULT: NOT DETECTED

## 2023-03-03 LAB — PHOSPHORUS: PHOSPHORUS: 2.5 mg/dL (ref 2.4–5.1)

## 2023-03-03 LAB — FOLATE: FOLATE: 7.5 ng/mL (ref >=5.4)

## 2023-03-03 LAB — BK VIRUS QUANTITATIVE PCR, BLOOD: BK BLOOD RESULT: NOT DETECTED

## 2023-03-03 LAB — VITAMIN B12: VITAMIN B-12: 695 pg/mL (ref 211–911)

## 2023-03-03 MED ADMIN — carvedilol (COREG) tablet 12.5 mg: 12.5 mg | ORAL | @ 13:00:00 | Stop: 2023-03-03

## 2023-03-03 MED ADMIN — tacrolimus (PROGRAF) 11 mg combo product: 11 mg | ORAL | @ 13:00:00

## 2023-03-03 MED ADMIN — sodium chloride (NS) 0.9 % infusion: 150 mL/h | INTRAVENOUS | @ 01:00:00 | Stop: 2023-03-02

## 2023-03-03 MED ADMIN — pantoprazole (Protonix) EC tablet 40 mg: 40 mg | ORAL | @ 02:00:00

## 2023-03-03 MED ADMIN — azathioprine (IMURAN) tablet 150 mg: 150 mg | ORAL | @ 02:00:00

## 2023-03-03 MED ADMIN — carvedilol (COREG) tablet 6.25 mg: 6.25 mg | ORAL | @ 19:00:00 | Stop: 2023-03-03

## 2023-03-03 MED ADMIN — tamsulosin (FLOMAX) 24 hr capsule 0.4 mg: .4 mg | ORAL | @ 19:00:00 | Stop: 2023-03-03

## 2023-03-03 MED ADMIN — tacrolimus (PROGRAF) 11 mg combo product: 11 mg | ORAL | @ 02:00:00

## 2023-03-03 MED ADMIN — carvedilol (COREG) tablet 12.5 mg: 12.5 mg | ORAL | @ 02:00:00

## 2023-03-03 MED ADMIN — predniSONE (DELTASONE) tablet 5 mg: 5 mg | ORAL | @ 13:00:00

## 2023-03-03 MED ADMIN — sodium chloride 3 % NEBULIZER solution 4 mL: 4 mL | RESPIRATORY_TRACT | @ 03:00:00 | Stop: 2023-03-02

## 2023-03-03 MED ADMIN — sodium chloride (NS) 0.9 % infusion: 125 mL/h | INTRAVENOUS | @ 04:00:00 | Stop: 2023-03-03

## 2023-03-03 MED ADMIN — magnesium sulfate in D5W 1 gram/100 mL infusion 1 g: 1 g | INTRAVENOUS | @ 04:00:00 | Stop: 2023-03-02

## 2023-03-03 MED ADMIN — pantoprazole (Protonix) EC tablet 40 mg: 40 mg | ORAL | @ 13:00:00

## 2023-03-03 MED ADMIN — sodium chloride (NS) 0.9 % infusion: 150 mL/h | INTRAVENOUS | @ 17:00:00 | Stop: 2023-03-03

## 2023-03-03 MED ADMIN — acetaminophen (TYLENOL) tablet 650 mg: 650 mg | ORAL | @ 19:00:00

## 2023-03-03 MED ADMIN — ipratropium-albuterol (DUO-NEB) 0.5-2.5 mg/3 mL nebulizer solution 3 mL: 3 mL | RESPIRATORY_TRACT | @ 03:00:00 | Stop: 2023-03-02

## 2023-03-03 MED ADMIN — bictegrav-emtricit-tenofov ala (BIKTARVY) 50-200-25 mg tablet 1 tablet: 1 | ORAL | @ 13:00:00

## 2023-03-03 MED ADMIN — sodium chloride (NS) 0.9 % infusion: 125 mL/h | INTRAVENOUS | @ 13:00:00 | Stop: 2023-03-03

## 2023-03-03 NOTE — Unmapped (Signed)
Atovaquone not given, per pt prefers pill/IV, med B informed, per pharmacy, no other form but liquid, pt aware. Denies pain. IV Mg given. Continuous NS at 125 ml/hr. Telemetry monitoring. Independent with ADLs. Bed in low locked position. Call light within reach.      Problem: Adult Inpatient Plan of Care  Goal: Plan of Care Review  Outcome: Ongoing - Unchanged  Goal: Patient-Specific Goal (Individualized)  Outcome: Ongoing - Unchanged  Goal: Absence of Hospital-Acquired Illness or Injury  Outcome: Ongoing - Unchanged  Intervention: Identify and Manage Fall Risk  Recent Flowsheet Documentation  Taken 03/02/2023 2030 by Vertis Scheib, RN  Safety Interventions:   fall reduction program maintained   commode/urinal/bedpan at bedside   environmental modification   lighting adjusted for tasks/safety   low bed   no IV/BP/blood draw left arm   nonskid shoes/slippers when out of bed  Goal: Optimal Comfort and Wellbeing  Outcome: Ongoing - Unchanged  Goal: Readiness for Transition of Care  Outcome: Ongoing - Unchanged  Goal: Rounds/Family Conference  Outcome: Ongoing - Unchanged     Problem: HIV/AIDS Infection  Goal: HIV/AIDS Symptom Control  Outcome: Ongoing - Unchanged     Problem: Acute Kidney Injury/Impairment  Goal: Fluid and Electrolyte Balance  Outcome: Ongoing - Unchanged  Goal: Improved Oral Intake  Outcome: Ongoing - Unchanged  Goal: Effective Renal Function  Outcome: Ongoing - Unchanged     Problem: Chronic Kidney Disease  Goal: Optimal Coping with Chronic Illness  Outcome: Ongoing - Unchanged  Goal: Electrolyte Balance  Outcome: Ongoing - Unchanged  Goal: Fluid Balance  Outcome: Ongoing - Unchanged  Goal: Optimal Functional Ability  Outcome: Ongoing - Unchanged  Goal: Absence of Anemia Signs and Symptoms  Outcome: Ongoing - Unchanged  Goal: Optimal Oral Intake  Outcome: Ongoing - Unchanged  Goal: Acceptable Pain Control  Outcome: Ongoing - Unchanged  Goal: Minimize Renal Failure Effects  Outcome: Ongoing - Unchanged

## 2023-03-03 NOTE — Unmapped (Signed)
Nephrology (MEDB) Progress Note    Assessment & Plan:   [facility] is a 43 y.o. 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)    HIV nephropathy (CMS-HCC)    Chronic deep vein thrombosis (DVT) of femoral vein of left lower extremity (CMS-HCC)    CAP (community acquired pneumonia)    Mild intermittent asthma without complication    Pulmonary hypertension (CMS-HCC)    Iron deficiency anemia    History of DVT (deep vein thrombosis)    Anemia due to stage 4 chronic kidney disease (CMS-HCC)    Secondary renal hyperparathyroidism (CMS-HCC)    Hypercalcemia due to chronic kidney disease    Hemodialysis AV fistula thrombosis (CMS-HCC)      Active Problems    Acute Kidney Injury on CKD - 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 Follows with transplant nephrologist [name] (last seen 02/02/2023). Baseline creatinine is 1.5-1.7, elevated on admission to 2.47. Seen 02/27/2023 at [facility] ED, with creatinine at  2.88 and left to come sooner to [facility] ED for ongoing evaluation and care. UA with rare bacteria and mucus, no protein. Renal transplant ultrasound with new mild right hydronephrosis w/ mildly elevated resistive indices improved from previous study. Received 1L of LR. AKI differential including acute renal transplant rejection, prerenal AKI due to dehydration/hypercalcemia (but not more elevated than it has been in the past), contrast vs medication-induced nephropathy (Cr already elevated at OSH prior to CT scan and ceftriaxone/vancomycin administration). Initially seemingly hypovolemia and hypercalemia given clinical status but continued to remain stable despite IV fluids. With immunocompromised state HIV, CMV, EBV, BK Virus, and Syphilis, but CMV, EBV, BK, RPR negative. May be seeing results of contrast of previous study vs possible rejection.   - avoid nephrotoxic agents   - daily renal function panel   - follow up 03/01/2023 Urine culture   - follow up HLA DSA  - SPEP/IFE pending  - NS to combat hypercalcemia (held with c/f for higher pressures)  - continue home immunosuppression with tacrolimus 10mg  PO BID, azathioprine 105mg  PO daily, and prednisone 5mg  PO daily  (initially on MMF, but stopped 2/2 pill esophagitis/gastitis)  - daily tacrolimus trough (goal level is 6-8 ng/mL)    C/f Multifocal Pneumonia - Hx of PJP Pneumonia - Productive Cough   One month of shortness of breath along with productive cough. 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 Presented [facility] on 02/27/2023 and was treated with vancomycin & cefepime. He then presented to [facility]. Chest CT at OSH was significant for bilateral patchy reticular opacities consistent with pneumonia. Found to have an elevated inflammatory markers (ESR 70, CRP 16). Negative RPP. Normal lactate. Afebrile here, but did endorse subjective fevers at home, and is having productive cough with yellow sputum. Reassuring stable on room air.  No acute findings on CXR. Considering HCAP, fungal vs PJP pneumonia, vs another abnormal granulomatous  disease. S/p one dose of CTX in the ED for CAP coverage.  - ICID consulted appreciate recs  - treated For HCAP w/ zosyn + azithromycin --> levofloxacin 03/02/23  - trying to start atovaquone for PJP prophyalxis (has allergy to bactrim w/ rash, and pentamidine causeD bronchospasm, and dapsone caused cytopenias) but hates taste  - LRCX pending  - induced sputum production for PJP PCR  - fungitell assay,galactomannan ordered     HIV (on ARVT) - Immunosuppression - Hx CMV, Syphilis, PJP  Diagnosed w/ HIV 10/2013. Follows with outpatient ID Dr. [name] in [place]. On Biktarvy. HIV undetected when checked on 10/27/2022. Viral load has been <20 since 2018. For CMV prophylaxis, patient chose to do preemptive monitoring and has never taken Valcyte. Last CMV PCR was detectable at <200 on 01/29/2023. For PJP prophylaxis, he previously has not tolerated Bactrim, pentamadine, or dapsone. Has been prescribed Atovaquone with strong recommendation for compliance with this medication, but was not taking it as of his last visit with his transplant nephrologist ([name]) on 02/02/2023. CD4 of 75 thought to be due thymoglobulin + immunosuppression. Can discuss with ID about need for histo prophylaxis?   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 PTH was elevated to 2182 on 12/14/2022. PTH from 03/02/23 of 1795. Received 1L of LR in the ED. States he is supposed to have surgery upcoming for possible hyperactive parathyroid gland (has already had 3 removed). Can consider granulomatous disease (consider PJP association), tertiary hyperparathyroidism, underlying worsening dehydration, malignancy. Considered cinacalet for him as well, but endorsed having an adverse reaction to it.   - daily BMP+Mg+Phos  - normal saline 150 ml/hr --> held 1/4 PM due to elevated BP's   - BID BMP, ionized calcium   - serum ACE, CA125, workup of infection above, vitamin D levels, SPEP/IFE      HTN Urgency  Noted that Goal BP is <130/80. In the past, patient has not tolerated amlodipine, hydralazine, metoprolol, labetalol. Losartan held at OSH due to hyperkalemia (K 5.4). Unfortunately became more hypertensive PM of 1/4 with systolic in 200's with mild headache. Declined hydralazine. Going to be careful with beta blockade as has had HR's in 60's. Given a dose of flomax and coreg 6.25 mg.   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 Fistula thrombosis is s/p excision with Transplant Surgery in May 2024. With history can consider PE. No chest pain, however held on CTA with AKI. Hematology has been consulted in the past. PVL's with no acute clot, but left thigh showing signs of chronic obstruction.   - DVT Prophylaxis for now    Troponin elevation (improved) - Elevated ProBNP   Noted to have a troponin at OSH of 29 from 02/27/2023. Has a hstrop 60 on 03/02/2023 recheck that downtrended. Pro-BNP from 03/01/2023 was elevated at 4342 (no previous comparison). Denies any chest pain. EKG not consistent with  acute ischemic changes. Likely some demand from dehydration. Of note these levels are lower than they have been in the past. Echocardiogram with normal LVEF, mild mitral stenosis, and moderate thickened mitral valve leaflets.    Chronic Problems  Anemia of Chronic Kidney Disease : Baseline Hgb 12-13, 11.9 on admission. Monitoring with daily CBC. B12, folate w/n limits. Monitoring with Daily CBC.     Mild Intermittent Asthma - OSA: Uses albuterol inhaler as needed at home. Diagnosed with severe obstructive sleep apnea following 10/21/2019 sleep study. Does not use a CPAP at night. Stable on room air since admission. Continue albuterol PRN    Issues Impacting Complexity of Management:  -High risk of complications from pain and/or analgesia likely to result in delirium  -The patient is at high risk for the development of complications of volume overload due to the need to provide IV hydration for suspected hypovolemia in the setting of: stage IV or greater CKD  -The patient is at high risk of complications from hypercalcemia, infection in immunocompromised state    Medical Decision Making: Reviewed records from the following unique sources  EKG, echo, previous outpatient documentation, H&P.    Daily Checklist:  Diet: Regular Diet  DVT PPx: Heparin 5000units q8h  Electrolytes: Replete Potassium to >/= 3.6 and Magnesium to >/= 1.8  Code Status: Full Code  Dispo:  Continue plan of care on floor    Team Contact Information:   Primary Team: Nephrology (MEDB)  Primary Resident: Julious Payer, MD, MD  Resident's Pager: [phone] (Nephrology Senior Resident)    Interval History:   No acute events overnight.    Feels okay. Still feeling well not short of breath. Feeling like his breathing is getting better just with antibiotics. No fevers or chills.      Objective:   Temp:  [36.5 ??C (97.7 ??F)-36.8 ??C (98.2 ??F)] 36.8 ??C (98.2 ??F)  Heart Rate:  [43-86] 83  Resp:  [18-20] 18  BP: (128-160)/(98-99) 160/98  SpO2:  [95 %-97 %] 95 %    Gen: NAD, converses appropriately  HENT: atraumatic, normocephalic currently.   Heart: RRR  Lungs: CTAB, no crackles or wheezes  Abdomen: soft, NTND  Extremities: No edema, fistula clotted but present in LUE, thrill felt from previous fistula of left lower forearm present

## 2023-03-03 NOTE — Unmapped (Signed)
Patient denies any pain, vs are stable. He had PVLs and echo done today. MRSA screen done, pending RT induced lower respiratory collection. Patient ambulates in the room with no issue, call bell within reach.    Problem: Adult Inpatient Plan of Care  Goal: Plan of Care Review  Outcome: Ongoing - Unchanged  Goal: Patient-Specific Goal (Individualized)  Outcome: Ongoing - Unchanged  Goal: Absence of Hospital-Acquired Illness or Injury  Outcome: Ongoing - Unchanged  Intervention: Identify and Manage Fall Risk  Recent Flowsheet Documentation  Taken 03/02/2023 0745 by Valentino Nose, RN  Safety Interventions:   fall reduction program maintained   low bed   no IV/BP/blood draw left arm  Goal: Optimal Comfort and Wellbeing  Outcome: Ongoing - Unchanged  Goal: Readiness for Transition of Care  Outcome: Ongoing - Unchanged  Goal: Rounds/Family Conference  Outcome: Ongoing - Unchanged

## 2023-03-04 LAB — BASIC METABOLIC PANEL
ANION GAP: 12 mmol/L (ref 5–14)
BLOOD UREA NITROGEN: 24 mg/dL — ABNORMAL HIGH (ref 9–23)
BUN / CREAT RATIO: 9
CALCIUM: 11.7 mg/dL — ABNORMAL HIGH (ref 8.7–10.4)
CHLORIDE: 113 mmol/L — ABNORMAL HIGH (ref 98–107)
CO2: 22 mmol/L (ref 20.0–31.0)
CREATININE: 2.69 mg/dL — ABNORMAL HIGH (ref 0.73–1.18)
EGFR CKD-EPI (2021) MALE: 30 mL/min/{1.73_m2} — ABNORMAL LOW (ref >=60)
GLUCOSE RANDOM: 107 mg/dL (ref 70–179)
POTASSIUM: 4.1 mmol/L (ref 3.4–4.8)
SODIUM: 147 mmol/L — ABNORMAL HIGH (ref 135–145)

## 2023-03-04 LAB — CBC
HEMATOCRIT: 31.4 % — ABNORMAL LOW (ref 39.0–48.0)
HEMOGLOBIN: 10.7 g/dL — ABNORMAL LOW (ref 12.9–16.5)
MEAN CORPUSCULAR HEMOGLOBIN CONC: 34.3 g/dL (ref 32.0–36.0)
MEAN CORPUSCULAR HEMOGLOBIN: 36.2 pg — ABNORMAL HIGH (ref 25.9–32.4)
MEAN CORPUSCULAR VOLUME: 105.8 fL — ABNORMAL HIGH (ref 77.6–95.7)
MEAN PLATELET VOLUME: 8.1 fL (ref 6.8–10.7)
PLATELET COUNT: 206 10*9/L (ref 150–450)
RED BLOOD CELL COUNT: 2.96 10*12/L — ABNORMAL LOW (ref 4.26–5.60)
RED CELL DISTRIBUTION WIDTH: 14.5 % (ref 12.2–15.2)
WBC ADJUSTED: 2.5 10*9/L — ABNORMAL LOW (ref 3.6–11.2)

## 2023-03-04 LAB — IONIZED CALCIUM VENOUS: CALCIUM IONIZED VENOUS (MG/DL): 6.52 mg/dL (ref 4.40–5.40)

## 2023-03-04 LAB — MAGNESIUM: MAGNESIUM: 1.4 mg/dL — ABNORMAL LOW (ref 1.6–2.6)

## 2023-03-04 LAB — TACROLIMUS LEVEL, TROUGH: TACROLIMUS, TROUGH: 7.9 ng/mL (ref 5.0–15.0)

## 2023-03-04 LAB — PHOSPHORUS: PHOSPHORUS: 3 mg/dL (ref 2.4–5.1)

## 2023-03-04 MED ADMIN — levoFLOXacin (LEVAQUIN) tablet 750 mg: 750 mg | ORAL | @ 14:00:00 | Stop: 2023-03-16

## 2023-03-04 MED ADMIN — carvedilol (COREG) tablet 25 mg: 25 mg | ORAL | @ 02:00:00

## 2023-03-04 MED ADMIN — tacrolimus (PROGRAF) 11 mg combo product: 11 mg | ORAL | @ 14:00:00

## 2023-03-04 MED ADMIN — atovaquone (MEPRON) oral suspension: 1500 mg | ORAL | @ 14:00:00

## 2023-03-04 MED ADMIN — pantoprazole (Protonix) EC tablet 40 mg: 40 mg | ORAL | @ 14:00:00

## 2023-03-04 MED ADMIN — pantoprazole (Protonix) EC tablet 40 mg: 40 mg | ORAL | @ 02:00:00

## 2023-03-04 MED ADMIN — bictegrav-emtricit-tenofov ala (BIKTARVY) 50-200-25 mg tablet 1 tablet: 1 | ORAL | @ 14:00:00

## 2023-03-04 MED ADMIN — tacrolimus (PROGRAF) 11 mg combo product: 11 mg | ORAL | @ 02:00:00

## 2023-03-04 MED ADMIN — carvedilol (COREG) tablet 25 mg: 25 mg | ORAL | @ 14:00:00

## 2023-03-04 MED ADMIN — azathioprine (IMURAN) tablet 150 mg: 150 mg | ORAL | @ 02:00:00

## 2023-03-04 MED ADMIN — predniSONE (DELTASONE) tablet 5 mg: 5 mg | ORAL | @ 14:00:00

## 2023-03-04 NOTE — Unmapped (Signed)
Patient's bp was elevated today, MD notified, cont IVF at 162ml/hr was stopped. He received a stat dose of Carvedilol and Flomax (pt has  multiple bp med allergies), bp down to 148/108. He c/o headache, Tylenol given and was effective. Patient ambulates in the room with no issue, call bell within reach.  Problem: Adult Inpatient Plan of Care  Goal: Plan of Care Review  Outcome: Ongoing - Unchanged  Goal: Patient-Specific Goal (Individualized)  Outcome: Ongoing - Unchanged  Goal: Absence of Hospital-Acquired Illness or Injury  Outcome: Ongoing - Unchanged  Intervention: Identify and Manage Fall Risk  Recent Flowsheet Documentation  Taken 03/03/2023 0800 by Valentino Nose, RN  Safety Interventions:   fall reduction program maintained   low bed   no IV/BP/blood draw left arm   nonskid shoes/slippers when out of bed  Goal: Optimal Comfort and Wellbeing  Outcome: Ongoing - Unchanged  Goal: Readiness for Transition of Care  Outcome: Ongoing - Unchanged  Goal: Rounds/Family Conference  Outcome: Ongoing - Unchanged     Problem: Infection  Goal: Absence of Infection Signs and Symptoms  Outcome: Ongoing - Unchanged     Problem: HIV/AIDS Infection  Goal: HIV/AIDS Symptom Control  Outcome: Ongoing - Unchanged

## 2023-03-04 NOTE — Unmapped (Signed)
Alert and oriented.  Non compliant with prophylaxis medication,  education given about risk of infections when one is on anti-organ rejection medication.  Coughing with minimal sputum output.  Lungs sounds diminished with crackles in all lobes-encouraged to seat in chair for meals and watching TV.   Ambulates around room independently.  Problem: Infection  Goal: Absence of Infection Signs and Symptoms  Outcome: Ongoing - Unchanged     Problem: HIV/AIDS Infection  Goal: HIV/AIDS Symptom Control  Outcome: Ongoing - Unchanged     Problem: Acute Kidney Injury/Impairment  Goal: Fluid and Electrolyte Balance  Outcome: Ongoing - Unchanged  Goal: Improved Oral Intake  Outcome: Ongoing - Unchanged  Goal: Effective Renal Function  Outcome: Ongoing - Unchanged     Problem: Airway Clearance Ineffective  Goal: Effective Airway Clearance  Outcome: Ongoing - Unchanged

## 2023-03-04 NOTE — Unmapped (Signed)
Denies pain when asked. Input/ output monitored. Telemetry monitoring. Independent with ADLs. Bed in low locked position. Call light within reach.     Problem: Adult Inpatient Plan of Care  Goal: Plan of Care Review  Outcome: Ongoing - Unchanged  Goal: Patient-Specific Goal (Individualized)  Outcome: Ongoing - Unchanged  Goal: Absence of Hospital-Acquired Illness or Injury  Outcome: Ongoing - Unchanged  Intervention: Identify and Manage Fall Risk  Recent Flowsheet Documentation  Taken 03/03/2023 2040 by Paisyn Guercio, RN  Safety Interventions:   commode/urinal/bedpan at bedside   fall reduction program maintained   environmental modification   lighting adjusted for tasks/safety   low bed   no IV/BP/blood draw left arm   nonskid shoes/slippers when out of bed  Goal: Optimal Comfort and Wellbeing  Outcome: Ongoing - Unchanged  Goal: Readiness for Transition of Care  Outcome: Ongoing - Unchanged  Goal: Rounds/Family Conference  Outcome: Ongoing - Unchanged     Problem: Infection  Goal: Absence of Infection Signs and Symptoms  Outcome: Ongoing - Unchanged     Problem: HIV/AIDS Infection  Goal: HIV/AIDS Symptom Control  Outcome: Ongoing - Unchanged     Problem: Acute Kidney Injury/Impairment  Goal: Fluid and Electrolyte Balance  Outcome: Ongoing - Unchanged  Goal: Improved Oral Intake  Outcome: Ongoing - Unchanged  Goal: Effective Renal Function  Outcome: Ongoing - Unchanged     Problem: Chronic Kidney Disease  Goal: Optimal Coping with Chronic Illness  Outcome: Ongoing - Unchanged  Goal: Electrolyte Balance  Outcome: Ongoing - Unchanged  Goal: Fluid Balance  Outcome: Ongoing - Unchanged  Goal: Optimal Functional Ability  Outcome: Ongoing - Unchanged  Goal: Absence of Anemia Signs and Symptoms  Outcome: Ongoing - Unchanged  Goal: Optimal Oral Intake  Outcome: Ongoing - Unchanged  Goal: Acceptable Pain Control  Outcome: Ongoing - Unchanged  Goal: Minimize Renal Failure Effects  Outcome: Ongoing - Unchanged

## 2023-03-04 NOTE — Unmapped (Signed)
Nephrology (MEDB) Progress Note    Assessment & Plan:   Christian Brown is a 43 y.o. 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)    HIV nephropathy (CMS-HCC)    Chronic deep vein thrombosis (DVT) of femoral vein of left lower extremity (CMS-HCC)    CAP (community acquired pneumonia)    Mild intermittent asthma without complication    Pulmonary hypertension (CMS-HCC)    Iron deficiency anemia    History of DVT (deep vein thrombosis)    Anemia due to stage 4 chronic kidney disease (CMS-HCC)    Secondary renal hyperparathyroidism (CMS-HCC)    Hypercalcemia due to chronic kidney disease    Hemodialysis AV fistula thrombosis (CMS-HCC)      Active Problems    Acute Kidney Injury on CKD - 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 Follows with transplant nephrologist Dr. Elvera Brown (last seen 02/02/2023). Baseline creatinine is 1.5-1.7, elevated on admission to 2.47. UA with rare bacteria and mucus, no protein. Renal transplant ultrasound with new mild right hydronephrosis w/ mildly elevated resistive indices improved from previous study and nonobstructing subcentimeter stone. AKI differential including acute renal transplant rejection, prerenal AKI due to dehydration/hypercalcemia (but not more elevated than it has been in the past). With immunocompromised state HIV, CMV, EBV, BK Virus, and Syphilis, but CMV, EBV, BK, RPR negative. Urine Sediment 1/5 with 4-10 RBC/HPF, Burr cells present, sporadic WBC. This could be related to kidney stone noted on [place]. Following up with transplant nephrology on further management.   - avoid nephrotoxic agents   - daily renal function panel   - HLA DSA, SPEP/IFE pending  - continue home immunosuppression with tacrolimus 10mg  PO BID, azathioprine 105mg  PO daily, and prednisone 5mg  PO daily  (initially on MMF, but stopped 2/2 pill esophagitis/gastitis)    C/f Multifocal Pneumonia - Hx of PJP Pneumonia - Productive Cough   One month of shortness of breath along with productive cough. 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 Presented [facility] on 02/27/2023 and was treated with vancomycin & cefepime. He then presented to [facility]. Chest CT at OSH was significant for bilateral patchy reticular opacities consistent with pneumonia. Found to have an elevated inflammatory markers (ESR 70, CRP 16). Negative RPP. Normal lactate. Reassuring stable on room air.  No acute findings on CXR. Considering HCAP, fungal vs PJP pneumonia, vs another abnormal granulomatous disease. S/p one dose of CTX in the ED for CAP coverage.  - ICID consulted appreciate recs  - levofloxacin 750 mg (1/5 - 1/15)   - atovaquone for PJP prophyalxis (has allergy to bactrim w/ rash, and pentamidine causeD bronchospasm, and dapsone caused cytopenias) but hates taste  - LRCX pending  - induced sputum production for PJP PCR  - fungitell assay,galactomannan ordered     HIV (on ARVT) - Immunosuppression - Hx CMV, Syphilis, PJP  Diagnosed w/ HIV 10/2013. Follows with outpatient ID Dr.  Cornelius Vernon Brown in [place]. On Biktarvy. HIV undetected when checked on 10/27/2022. Viral load has been <20 since 2018. For CMV prophylaxis, patient chose to do preemptive monitoring and has never taken Valcyte. Last CMV PCR was detectable at <200 on 01/29/2023. For PJP prophylaxis, he previously has not tolerated Bactrim, pentamadine, or dapsone. Has been prescribed Atovaquone with strong recommendation for compliance with this medication, but was not taking it as of his last visit with his transplant nephrologist (Dr. Dewitt Brown) on 02/02/2023. CD4 of 75 thought to be due thymoglobulin + immunosuppression. 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 PTH was elevated to 2182 on 12/14/2022. PTH from 03/02/23 of 1795. Received 1L of LR in the ED. States he is supposed to have surgery upcoming for possible hyperactive parathyroid gland (has already had 3 removed). Can consider granulomatous disease (consider PJP association), tertiary hyperparathyroidism, underlying worsening dehydration, malignancy. Considered cinacalet for him as well, but endorsed having an adverse reaction to it. Ionized Ca, Ca appears to be at patient's baseline, although elevated. Thus will hold off on additional IV fluids, but could consider resuming if begins to rise  - daily BMP+Mg+Phos  - BID BMP, ionized calcium   - serum ACE, CA125, workup of infection above, vitamin D levels, SPEP/IFE      HTN Urgency (improved)   Noted that Goal BP is <130/80. In the past, patient has not tolerated amlodipine, hydralazine, metoprolol, labetalol. Losartan held at OSH due to hyperkalemia (K 5.4). Unfortunately became more hypertensive PM of 1/4 with systolic in 200's with mild headache. Declined hydralazine. Going to be careful with beta blockade as has had HR's in 60's. Given a dose of flomax and coreg 6.25 mg. BP WNL and without additional headache.   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 Fistula thrombosis is s/p excision with Transplant Surgery in May 2024. With history can consider PE. No chest pain, however held on CTA with AKI. Hematology has been consulted in the past. PVL's with no acute clot, but left thigh showing signs of chronic obstruction.   - DVT Prophylaxis for now    Troponin elevation (improved) - Elevated ProBNP   Noted to have a troponin at OSH of 29 from 02/27/2023. Has a hstrop 60 on 03/02/2023 recheck that downtrended. Pro-BNP from 03/01/2023 was elevated at 4342 (no previous comparison). Denies any chest pain. EKG not consistent with acute ischemic changes. Likely some demand from dehydration. Of note these levels are lower than they have been in the past. Echocardiogram with normal LVEF, mild mitral stenosis, and moderate thickened mitral valve leaflets.    Chronic Problems  Anemia of Chronic Kidney Disease : Baseline Hgb 12-13, 11.9 on admission. Monitoring with daily CBC. B12, folate w/n limits. Monitoring with Daily CBC.     Mild Intermittent Asthma - OSA: Uses albuterol inhaler as needed at home. Diagnosed with severe obstructive sleep apnea following 10/21/2019 sleep study. Does not use a CPAP at night. Stable on room air since admission.  Continue albuterol PRN    Issues Impacting Complexity of Management:  -High risk of complications from pain and/or analgesia likely to result in delirium  -The patient is at high risk for the development of complications of volume overload due to the need to provide IV hydration for suspected hypovolemia in the setting of: stage IV or greater CKD  -The patient is at high risk of complications from hypercalcemia, infection in immunocompromised state    Medical Decision Making: Reviewed records from the following unique sources  EKG, echo, previous outpatient documentation, H&P.    Daily Checklist:  Diet: Regular Diet  DVT PPx: Heparin 5000units q8h  Electrolytes: Replete Potassium to >/= 3.6 and Magnesium to >/= 1.8  Code Status: Full Code  Dispo:  Continue plan of care on floor    Team Contact Information:   Primary Team: Nephrology (MEDB)  Primary Resident: Seleta Rhymes, MD  Resident's Pager: [phone] (Nephrology Intern - Tower)    Interval History:   No acute events overnight.    Drinking more fluids and concerned this is due to renal failure. Feeling like his breathing is getting better just with antibiotics. No fevers or chills.      Objective:   Temp:  [36.4 ??C (97.5 ??F)-36.8 ??C (98.2 ??F)] 36.8 ??C (98.2 ??F)  Heart Rate:  [67-91] 79  Resp:  [18] 18  BP: (141-209)/(97-125) 153/117  SpO2:  [99 %-100 %] 99 %    Gen: NAD, converses appropriately  HENT: atraumatic, normocephalic currently.   Heart: RRR  Lungs: CTAB, no crackles or wheezes  Abdomen: soft, NTND  Extremities: No edema, fistula clotted but present in LUE, thrill felt from previous fistula of left lower forearm present     Seleta Rhymes, MD  Internal Medicine PGY 1

## 2023-03-05 DIAGNOSIS — Z940 Kidney transplant status: Principal | ICD-10-CM

## 2023-03-05 DIAGNOSIS — Z79899 Other long term (current) drug therapy: Principal | ICD-10-CM

## 2023-03-05 LAB — BASIC METABOLIC PANEL
ANION GAP: 11 mmol/L (ref 5–14)
BLOOD UREA NITROGEN: 25 mg/dL — ABNORMAL HIGH (ref 9–23)
BUN / CREAT RATIO: 9
CALCIUM: 11.9 mg/dL — ABNORMAL HIGH (ref 8.7–10.4)
CHLORIDE: 110 mmol/L — ABNORMAL HIGH (ref 98–107)
CO2: 22 mmol/L (ref 20.0–31.0)
CREATININE: 2.69 mg/dL — ABNORMAL HIGH (ref 0.73–1.18)
EGFR CKD-EPI (2021) MALE: 30 mL/min/{1.73_m2} — ABNORMAL LOW (ref >=60)
GLUCOSE RANDOM: 101 mg/dL (ref 70–179)
POTASSIUM: 4 mmol/L (ref 3.4–4.8)
SODIUM: 143 mmol/L (ref 135–145)

## 2023-03-05 LAB — HLA DS POST TRANSPLANT
ANTI-DONOR DRW #1 MFI: 1 MFI
ANTI-DONOR DRW #2 MFI: 3 MFI
ANTI-DONOR HLA-A #1 MFI: 0 MFI
ANTI-DONOR HLA-A #2 MFI: 0 MFI
ANTI-DONOR HLA-B #1 MFI: 0 MFI
ANTI-DONOR HLA-B #2 MFI: 0 MFI
ANTI-DONOR HLA-C #1 MFI: 0 MFI
ANTI-DONOR HLA-DQB #1 MFI: 5 MFI
ANTI-DONOR HLA-DQB #2 MFI: 176 MFI
ANTI-DONOR HLA-DR #1 MFI: 2 MFI
ANTI-DONOR HLA-DR #2 MFI: 16 MFI

## 2023-03-05 LAB — SERUM PROTEIN ELECTROPHORESIS AND IMMUNOFIXATION
ALBUMIN (SPE): 3 g/dL — ABNORMAL LOW (ref 3.5–5.0)
ALPHA-1 GLOBULIN: 0.5 g/dL (ref 0.2–0.5)
ALPHA-2 GLOBULIN: 0.9 g/dL (ref 0.5–1.1)
BETA-1 GLOBULIN: 0.3 g/dL (ref 0.3–0.6)
BETA-2 GLOBULIN: 0.4 g/dL (ref 0.2–0.6)
GAMMAGLOBULIN: 1.8 g/dL — ABNORMAL HIGH (ref 0.5–1.5)
M SPIKE: 0.4 g/dL — ABNORMAL HIGH (ref <=0.0)
PROTEIN TOTAL (SPECIAL CHEM): 6.9 g/dL

## 2023-03-05 LAB — IONIZED CALCIUM VENOUS: CALCIUM IONIZED VENOUS (MG/DL): 6.55 mg/dL (ref 4.40–5.40)

## 2023-03-05 LAB — CBC
HEMATOCRIT: 33 % — ABNORMAL LOW (ref 39.0–48.0)
HEMOGLOBIN: 11.3 g/dL — ABNORMAL LOW (ref 12.9–16.5)
MEAN CORPUSCULAR HEMOGLOBIN CONC: 34.4 g/dL (ref 32.0–36.0)
MEAN CORPUSCULAR HEMOGLOBIN: 35.9 pg — ABNORMAL HIGH (ref 25.9–32.4)
MEAN CORPUSCULAR VOLUME: 104.3 fL — ABNORMAL HIGH (ref 77.6–95.7)
MEAN PLATELET VOLUME: 7.4 fL (ref 6.8–10.7)
PLATELET COUNT: 197 10*9/L (ref 150–450)
RED BLOOD CELL COUNT: 3.16 10*12/L — ABNORMAL LOW (ref 4.26–5.60)
RED CELL DISTRIBUTION WIDTH: 14.5 % (ref 12.2–15.2)
WBC ADJUSTED: 2 10*9/L — ABNORMAL LOW (ref 3.6–11.2)

## 2023-03-05 LAB — FSAB CLASS 2 ANTIBODY SPECIFICITY: HLA CL2 AB RESULT: POSITIVE

## 2023-03-05 LAB — FSAB CLASS 1 ANTIBODY SPECIFICITY: HLA CLASS 1 ANTIBODY RESULT: NEGATIVE

## 2023-03-05 LAB — MAGNESIUM: MAGNESIUM: 1.3 mg/dL — ABNORMAL LOW (ref 1.6–2.6)

## 2023-03-05 LAB — VITAMIN D 25 HYDROXY: VITAMIN D, TOTAL (25OH): 11.9 ng/mL — ABNORMAL LOW (ref 20.0–80.0)

## 2023-03-05 LAB — TACROLIMUS LEVEL, TROUGH: TACROLIMUS, TROUGH: 6.3 ng/mL (ref 5.0–15.0)

## 2023-03-05 LAB — PHOSPHORUS: PHOSPHORUS: 3.1 mg/dL (ref 2.4–5.1)

## 2023-03-05 MED ADMIN — magnesium sulfate 2gm/50mL IVPB: 2 g | INTRAVENOUS | @ 17:00:00 | Stop: 2023-03-05

## 2023-03-05 MED ADMIN — carvedilol (COREG) tablet 25 mg: 25 mg | ORAL | @ 02:00:00

## 2023-03-05 MED ADMIN — predniSONE (DELTASONE) tablet 5 mg: 5 mg | ORAL | @ 13:00:00

## 2023-03-05 MED ADMIN — carvedilol (COREG) tablet 25 mg: 25 mg | ORAL | @ 13:00:00

## 2023-03-05 MED ADMIN — pantoprazole (Protonix) EC tablet 40 mg: 40 mg | ORAL | @ 02:00:00

## 2023-03-05 MED ADMIN — bictegrav-emtricit-tenofov ala (BIKTARVY) 50-200-25 mg tablet 1 tablet: 1 | ORAL | @ 13:00:00

## 2023-03-05 MED ADMIN — azathioprine (IMURAN) tablet 150 mg: 150 mg | ORAL | @ 02:00:00

## 2023-03-05 MED ADMIN — acetaminophen (TYLENOL) tablet 650 mg: 650 mg | ORAL | @ 09:00:00

## 2023-03-05 MED ADMIN — acetaminophen (TYLENOL) tablet 650 mg: 650 mg | ORAL | @ 13:00:00

## 2023-03-05 MED ADMIN — tacrolimus (PROGRAF) 11 mg combo product: 11 mg | ORAL | @ 02:00:00

## 2023-03-05 MED ADMIN — acetaminophen (TYLENOL) tablet 650 mg: 650 mg | ORAL | @ 02:00:00

## 2023-03-05 MED ADMIN — minoxidil (LONITEN) tablet 2.5 mg: 2.5 mg | ORAL | @ 17:00:00

## 2023-03-05 MED ADMIN — pantoprazole (Protonix) EC tablet 40 mg: 40 mg | ORAL | @ 13:00:00

## 2023-03-05 MED ADMIN — oxyCODONE (ROXICODONE) immediate release tablet 5 mg: 5 mg | ORAL | @ 11:00:00 | Stop: 2023-03-05

## 2023-03-05 MED ADMIN — tacrolimus (PROGRAF) 11 mg combo product: 11 mg | ORAL | @ 15:00:00

## 2023-03-05 NOTE — Unmapped (Signed)
Pt alert & oriented X4. No falls this shift. In bed. Pt reported pain this shift and interventions given. Call bell and table within reach.Bed low and locked. I & O's monitored. Pt encouraged to use call bell this shift for assistance. Dressing clean dry and intact. Pt express no other needs at this time. Plan of care ongoing.        Problem: Adult Inpatient Plan of Care  Goal: Plan of Care Review  Outcome: Progressing  Goal: Patient-Specific Goal (Individualized)  Outcome: Progressing  Goal: Absence of Hospital-Acquired Illness or Injury  Outcome: Progressing  Intervention: Identify and Manage Fall Risk  Recent Flowsheet Documentation  Taken 03/04/2023 1930 by Hermelinda Dellen, RN  Safety Interventions:   fall reduction program maintained   low bed   nonskid shoes/slippers when out of bed  Goal: Optimal Comfort and Wellbeing  Outcome: Progressing  Goal: Readiness for Transition of Care  Outcome: Progressing  Goal: Rounds/Family Conference  Outcome: Progressing     Problem: Infection  Goal: Absence of Infection Signs and Symptoms  Outcome: Progressing     Problem: HIV/AIDS Infection  Goal: HIV/AIDS Symptom Control  Outcome: Progressing     Problem: Wound  Goal: Optimal Coping  Outcome: Progressing  Goal: Optimal Functional Ability  Outcome: Progressing  Goal: Absence of Infection Signs and Symptoms  Outcome: Progressing  Goal: Improved Oral Intake  Outcome: Progressing  Goal: Optimal Pain Control and Function  Outcome: Progressing  Goal: Skin Health and Integrity  Outcome: Progressing  Intervention: Optimize Skin Protection  Recent Flowsheet Documentation  Taken 03/04/2023 1930 by Hermelinda Dellen, RN  Head of Bed [facility]) Positioning: HOB elevated  Goal: Optimal Wound Healing  Outcome: Progressing     Problem: Acute Kidney Injury/Impairment  Goal: Fluid and Electrolyte Balance  Outcome: Progressing  Goal: Improved Oral Intake  Outcome: Progressing  Goal: Effective Renal Function  Outcome: Progressing     Problem: Chronic Kidney Disease  Goal: Optimal Coping with Chronic Illness  Outcome: Progressing  Goal: Electrolyte Balance  Outcome: Progressing  Goal: Fluid Balance  Outcome: Progressing  Goal: Optimal Functional Ability  Outcome: Progressing  Goal: Absence of Anemia Signs and Symptoms  Outcome: Progressing  Goal: Optimal Oral Intake  Outcome: Progressing  Goal: Acceptable Pain Control  Outcome: Progressing  Goal: Minimize Renal Failure Effects  Outcome: Progressing     Problem: Airway Clearance Ineffective  Goal: Effective Airway Clearance  Outcome: Progressing

## 2023-03-05 NOTE — Unmapped (Signed)
Nephrology ([facility]) Progress Note    Assessment & Plan:   Christian Brown is a 43 y.o. 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)    HIV nephropathy (CMS-HCC)    Chronic deep vein thrombosis (DVT) of femoral vein of left lower extremity (CMS-HCC)    CAP (community acquired pneumonia)    Mild intermittent asthma without complication    Pulmonary hypertension (CMS-HCC)    Iron deficiency anemia    History of DVT (deep vein thrombosis)    Anemia due to stage 4 chronic kidney disease (CMS-HCC)    Secondary renal hyperparathyroidism (CMS-HCC)    Hypercalcemia due to chronic kidney disease    Hemodialysis AV fistula thrombosis (CMS-HCC)      Active Problems    Acute Kidney Injury on CKD - 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 Follows with transplant nephrologist [name] (last seen 02/02/2023). Baseline creatinine is 1.5-1.7, elevated on admission to 2.47. UA with rare bacteria and mucus, no protein. Renal transplant ultrasound with new mild right hydronephrosis w/ mildly elevated resistive indices improved from previous study and nonobstructing subcentimeter stone. AKI differential including acute renal transplant rejection, prerenal AKI due to dehydration/hypercalcemia (but not more elevated than it has been in the past). With immunocompromised state HIV, CMV, EBV, BK Virus, and Syphilis, but CMV, EBV, BK, RPR negative. Microscopic evaluation on 1/5 of urine with up to 10 RBC/HPF, spurred but no acanthocytes, occasional WBC and RTE's. HLA DSA reassuringly negative. Cr elevated but stable on 1/6. However, with unclear cause of AKI, will plan for kidney biopsy on 1/7.   - avoid nephrotoxic agents   - daily renal function panel   - HLA DSA negative   - SPEP/IFE pending  - continue home immunosuppression with tacrolimus 10mg  PO BID, azathioprine 105mg  PO daily, and prednisone 5mg  PO daily  (initially on MMF, but stopped 2/2 pill esophagitis/gastitis)    C/f Multifocal Pneumonia - Hx of PJP Pneumonia - Productive Cough (improving)   One month of shortness of breath along with productive cough. 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 Presented [facility] on 02/27/2023 and was treated with vancomycin & cefepime. He then presented to [facility]. Chest CT at OSH was significant for bilateral patchy reticular opacities consistent with pneumonia. Found to have an elevated inflammatory markers (ESR 70, CRP 16). Negative RPP. Normal lactate. Reassuring stable on room air.  No acute findings on CXR. Considering HCAP, fungal vs PJP pneumonia, vs another abnormal granulomatous disease. Patient refused atovaquone this AM due to previous reaction, however per chart review, patient took medication on 1/5 without reaction. Patient clarifying with family regarding previous reactions. Reassuringly patient without additional symptoms with improvement in cough.   - ICID consulted appreciate recs  - levofloxacin 750 mg (1/5 - 1/9)   - atovaquone for PJP prophyalxis (has allergy to bactrim w/ rash, and pentamidine causeD bronchospasm, and dapsone caused cytopenias)   -  induced sputum production for PJP PCR  - fungitell assay,galactomannan ordered     HIV (on ARVT) - Immunosuppression - Hx CMV, Syphilis, PJP  Diagnosed w/ HIV 10/2013. Follows with outpatient ID Dr. [name] in [place]. On Biktarvy. HIV undetected when checked on 10/27/2022. Viral load has been <20 since 2018. For CMV prophylaxis, patient chose to do preemptive monitoring and has never taken Valcyte. Last CMV PCR was detectable at <200 on 01/29/2023. For PJP prophylaxis, he previously has not tolerated Bactrim, pentamadine, or dapsone. Has been prescribed Atovaquone with strong recommendation for compliance with this medication, but was not taking it as of his last visit with his transplant nephrologist ([name]) on 02/02/2023. CD4 of 75 thought to be due thymoglobulin + immunosuppression. Per discussion with ID, will hold on further antifungal ppx.   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 PTH was elevated to 2182 on 12/14/2022. PTH from 03/02/23 of 1795. Received 1L of LR in the ED. States he is supposed to have surgery upcoming for possible hyperactive parathyroid gland (has already had 3 removed). Can consider granulomatous disease (consider PJP association), tertiary hyperparathyroidism, underlying worsening dehydration, malignancy. Considered cinacalet for him as well, but endorsed having an adverse reaction to it. Ionized Ca, Ca appears to be at patient's baseline, although elevated. Thus will hold off on additional IV fluids, but could consider resuming if begins to rise.   - daily BMP+Mg+Phos  - BID BMP, ionized calcium   - serum ACE, CA125, vitamin D levels, SPEP/IFE      HTN - HTN Urgency (resolved)   Noted that Goal BP is <130/80. In the past, patient has not tolerated amlodipine, hydralazine, metoprolol, labetalol. Losartan held at OSH due to hyperkalemia (K 5.4). Unfortunately became more hypertensive PM of 1/4 with systolic in 200's with mild headache. Declined hydralazine. BP WNL although slightly elevated thus will restart home minoxidil 2.5mg  daily.   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 Fistula thrombosis is s/p excision with Transplant Surgery in May 2024. With history can consider PE. No chest pain, however held on CTA with AKI. Hematology has been consulted in the past. PVL's with no acute clot, but left thigh showing signs of chronic obstruction.   - DVT Prophylaxis for now    Chronic Problems  Anemia of Chronic Kidney Disease : Baseline Hgb 12-13, 11.9 on admission. Monitoring with daily CBC. B12, folate w/n limits. Monitoring with Daily CBC.     Mild Intermittent Asthma - OSA: Uses albuterol inhaler as needed at home. Diagnosed with severe obstructive sleep apnea following 10/21/2019 sleep study. Does not use a CPAP at night. Stable on room air since admission. Continue albuterol PRN    Issues Impacting Complexity of Management:  -High risk of complications from pain and/or analgesia likely to result in delirium  -The patient is at high risk for the development of complications of volume overload due to the need to provide IV hydration for suspected hypovolemia in the setting of: stage  IV or greater CKD  -The patient is at high risk of complications from hypercalcemia, infection in immunocompromised state    Medical Decision Making: Reviewed records from the following unique sources  EKG, echo, previous outpatient documentation, H&P.    Daily Checklist:  Diet: Regular Diet  DVT PPx: Heparin 5000units q8h  Electrolytes: Replete Potassium to >/= 3.6 and Magnesium to >/= 1.8  Code Status: Full Code  Dispo:  Continue plan of care on floor    Team Contact Information:   Primary Team: Nephrology ([facility])  Primary Resident: Seleta Rhymes, MD  Resident's Pager: [phone] (Nephrology Intern - Tower)    Interval History:   No acute events overnight.    Patient with headache this AM 8/10 without appreciable neurological deficits. Feels like BP elevated and requesting additional medication.     Objective:   Temp:  [36.8 ??C (98.2 ??F)] 36.8 ??C (98.2 ??F)  Heart Rate:  [79-84] 82  Resp:  [18-22] 22  BP: (153-169)/(103-119) 158/104  SpO2:  [98 %-99 %] 98 %    Gen: NAD, converses appropriately  HENT: atraumatic, normocephalic currently.   Heart: RRR  Lungs: CTAB, no crackles or wheezes  Abdomen: soft, NTND  Extremities: No edema, fistula clotted but present in LUE, thrill felt from previous fistula of left lower forearm present     Seleta Rhymes, MD  Internal Medicine PGY 1

## 2023-03-05 NOTE — Unmapped (Signed)
IMMUNOCOMPROMISED HOST INFECTIOUS DISEASE PROGRESS NOTE    Assessment/Plan:     Juandiego Arntzen is a 43 y.o. 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  - Immunosuppression: Tacrolimus (Prograf) goal 6-8ng/mL, azathioprine and Prednisone 5mg  daily. Initially was on MMF but did not tolerate due to gastritis  - History of rejection if any: none but noted with AKI and c/f acute rejection  - s/p stent removal 10/27/22      Pertinent comorbidities:  Hx pericardial effusion s/p pericardial window  Mitral valve stenosis  Pulmonary HTN with RV dysfunction  OSA on bipap  DVT 2015,  Hx genital warts  Hx seizures, per report 2/2 reglan; last seizure >10 years ago  Intussusception s/p bowel resection, ileocecectomy   Multiple seasonal/environmental allergies  Gastric ulcer with moderate duodenitis (07/2022) with repeat EGD 09/12/22 with severe esophagitis. Infectious stains negative, leading etiology mycophenolate induced esophagitis with plans for fu EGD in 2 months   Post transplant hyperparathyroidism and hypercalcemia - pending surgical removal 03/14/2023  AKI Estimated Creatinine Clearance: 38.1 mL/min (A) (based on SCr of 2.41 mg/dL (H)).   - 03/01/2023: Transplant Renal Doppler US: The renal transplant was located in the right lower quadrant. Normal size and echogenicity. Mild dilatation of the renal pelvis and central calyces. 0.4 cm echogenic focus with associated twinkle artifact, which may represent a nonobstructive renal calculus. No solid masses. No perinephric collections identified.   - 1/2 CMV negative, EBV and BK virus pending     Pertinent exposures:  Travel: [place] 2023, [facility] [place] 2024     Summary of pertinent prior infections:  - Right otitis media treated w cefdinir/ ofloxacin otic gtts 05/2020  - E faecalis UTI 05/2016; vanc and ampicillin sensitive  - MRSA right ear culture/otitis, 10/2015  - Cellulitis 11/2013  - Right thigh abscess w/ Group A Strep; I&D right lower extremity 2016; treated with primaxin, clinda, dapto; discharged on Keflex  - Shingles before 2015  -Buttock abscess 04/2021 and prior frequent SSTI/skin boils  - remote HSV reactivation (mucocutaneous disease)     Hx Syphilis 11/2019  - RPR 1:8 (11/2019); treated (nonreactive 10/2018)  - per CE, re-treated with 14 days of doxycycline  - No other stds  - RPR 1:8 (02/2020), RPR 1:4 (06/2020) -> RPR 1:2 (01/2021)     Hx of PJP. 2015      Active infection:  # Cough with c/f bilateral lower lobe PnA vs. Acute bronchiolitis on CT Chest, 12/31  - 12/31 COVID, FLU, RSV negative  - 12/31: CT Chest: Patchy reticulonodular opacity in both lungs is compatible with pneumonia. Bronchiectasis in both lower lobes probably reflects sequela of chronic infection or aspiration.    1/2 RPP: negative  1/3 LRCx: OPF;  MRSA Nares screen negative     12/31 Vanc/Cefepime--> 1/03 Levofloxacin    #HIV, CD4 decline s/p thymo but previous VL undetectable  - 1/3 Abs CD4 <75  - 1/3 Syphilis screen: NR  - 1/4 HIV RNA Quant, PCR still in process <--undetectable 10/27/2022     Tx continue Biktarvy     Antimicrobial allergy/intolerance:   TMP+SMX - nausea, emesis  Vancomycin - kidney injury,  nausea  Penicillin - hives  Pentamidine - severe bronchospasm  Dapsone - cytopenias         RECOMMENDATIONS    Diagnosis  Follow-up induced sputum for PJP  Follow-up HIV Viral RNA  Follow-up fungitell assay in the serum  Discuss asthma control as patient states that he was unable to get inhaler medication due to insurance issues    Management  Stop Levofloxacin - patient would have received 7 days of antibiotics that would adequately cover a CAP infection. Continue home Biktarvy     Antimicrobial prophylaxis required for transplant immunosuppression   Continue Atovaquone 1500mg  orally daily - patient strongly advised to take medication at this time but states he is not planning to take this medication. Explains that this was his only option as he had reactions to all other medications. Would encourage team to give supportive therapy if needed (anti-emetics) if needed to facilitate his taking treatment.   No need to start antifungal ppx at this time: serum crypto negative and patient on appropriate HAART with undetectable VL without occupational risk factors for invasive Histo infection    Intensive toxicity monitoring for prescription antimicrobials   CBC w/diff at least once per week  CMP at least once per week  clinical assessments for rashes or other skin changes    The [facility] ID service will sign off. No outpatient ID follow up anticipated.  If patient would like to continue care with [facility] please page ICID on call to arrange outpatient follow-up but for now he will continue with his HIV physician in [place].  Care for a suspected or confirmed infection was provided by an ID specialist in this encounter. ([id number])          Please page the ID Transplant/Liquid Oncology Fellow consult at [phone] with questions.  Patient discussed with Dr. Drue Second.    Neita Garnet, MBBS  [facility]    Subjective:     External record(s): Primary team note: NAEON, breathing appears to be improved. .    Independent historian(s): no independent historian required.       Interval History:   Afebrile and VSS.  Denies fever, chills, cough, chest pain, shortness of breathe.     Medications:  Current Medications as of 03/05/2023  Scheduled  PRN   atovaquone, 1,500 mg, Daily  azathioprine, 150 mg, Daily  bictegrav-emtricit-tenofov ala, 1 tablet, Q AM  carvedilol, 25 mg, BID  heparin (porcine) for subcutaneous use, 5,000 Units, Q8H SCH  levoFLOXacin, 750 mg, Every other day (0600)  pantoprazole, 40 mg, BID  predniSONE, 5 mg, Daily  tacrolimus, 11 mg, BID      acetaminophen, 650 mg, Q4H PRN  albuterol, 1-2 puff, Q6H PRN  senna, 1 tablet, BID PRN   And  docusate sodium, 100 mg, BID PRN  melatonin, 3 mg, Nightly PRN         Objective:     Vital Signs last 24 hours:  Temp:  [36.8 ??C (98.2 ??F)] 36.8 ??C (98.2 ??F)  Heart Rate:  [82-87] 87  Resp:  [20-22] 20  BP: (156-169)/(103-119) 156/118  MAP (mmHg):  [116-134] 129  SpO2:  [96 %-98 %] 96 %    Physical Exam:   Patient Lines/Drains/Airways Status       Active Active Lines, Drains, & Airways       Name Placement date Placement time Site Days    Ureteral Drain/Stent 16 Fr. 09/05/22  1925  --  180  Peripheral IV 03/02/23 Anterior;Right Forearm 03/02/23  1217  Forearm  2    Arteriovenous Fistula - Vein Graft  Access  Arteriovenous fistula Left Forearm --  --  Forearm  --    Arteriovenous Fistula - Vein Graft  Access 06/06/22 Left Forearm 06/06/22  --  Forearm  272    Arteriovenous Fistula - Vein Graft  Access 07/06/22 0219 Arteriovenous fistula Left;Upper Arm 07/06/22  0219  Arm  242                  Const [x]  vital signs above    []  NAD, non-toxic appearance [x]  Chronically ill-appearing, non-distressed        Eyes [x]  Lids normal bilaterally, conjunctiva anicteric and noninjected OU     [] PERRL  [] EOMI        ENMT [x]  Normal appearance of external nose and ears, no nasal discharge        []  MMM, no lesions on lips or gums [x]  No thrush, leukoplakia, oral lesions  []  Dentition good []  Edentulous []  Dental caries present  []  Hearing normal  []  TMs with good light reflexes bilaterally         Neck [x]  Neck of normal appearance and trachea midline        []  No thyromegaly, nodules, or tenderness   []  Full neck ROM        Lymph []  No LAD in neck     []  No LAD in supraclavicular area     []  No LAD in axillae   []  No LAD in epitrochlear chains     []  No LAD in inguinal areas        CV [x]  RRR            [x]  No peripheral edema     []  Pedal pulses intact   []  No abnormal heart sounds appreciated   []  Extremities WWP         Resp [x]  Normal WOB at rest    []  No breathlessness with speaking, no coughing  [x]  CTA anteriorly    [x]  CTA posteriorly          GI [x]  Normal inspection, NTND   []  NABS     []  No umbilical hernia on exam       []  No hepatosplenomegaly     []  Inspection of perineal and perianal areas normal        GU []  Normal external genitalia     [] No urinary catheter present in urethra   []  No CVA tenderness    []  No tenderness over renal allograft  Deferred      MSK []  No clubbing or cyanosis of hands       []  No vertebral point tenderness  []  No focal tenderness or abnormalities on palpation of joints in RUE, LUE, RLE, or LLE  LUE AV Fistula - no e/o infection      Skin [x]  No rashes, lesions, or ulcers of visualized skin     []  Skin warm and dry to palpation         Neuro [x]  Face expression symmetric  []  Sensation to light touch grossly intact throughout    []  Moves extremities equally    [x]  No tremor noted        [x]  CNs II-XII grossly intact     []  DTRs normal and symmetric throughout []  Gait unremarkable        Psych [x]  Appropriate affect       [  x] Fluent speech         [x]  Attentive, good eye contact  []  Oriented to person, place, time          [x]  Judgment and insight are appropriate           Data for Medical Decision Making     (1/02) EKG QTcF 420    I discussed mgm't w/qualified health care professional(s) involved in case: recs with team .    I reviewed CBC results (WBC downtrending), chemistry results (creatinine not improving), and micro result(s) (1/4 LRCX OPF, 1/3 BCX 2/2 NGTD, ).    I independently visualized/interpreted not done.       Recent Labs     Units 03/01/23  1114 03/02/23  0825 03/02/23  2237 03/03/23  0803 03/04/23  0700   WBC 10*9/L 3.3*   < > 2.4*   < > 2.5*   HGB g/dL 46.9*   < > 62.9*   < > 10.7*   PLT 10*9/L 228   < > 204   < > 206   NEUTROABS 10*9/L 2.6  --  1.6*  --   --    LYMPHSABS 10*9/L 0.3*  -- 0.5*  --   --    EOSABS 10*9/L 0.0  --  0.0  --   --    BUN mg/dL 22   < >  --    < > 24*   CREATININE mg/dL 5.28*   < >  --    < > 2.69*   AST U/L 11  --   --   --   --    ALT U/L <7*  --   --   --   --    BILITOT mg/dL 0.4  --   --   --   --    ALKPHOS U/L 399*  --   --   --   --    K mmol/L 4.2   < >  --    < > 4.1   MG mg/dL  --    < >  --    < > 1.4*   PHOS mg/dL  --    < >  --    < > 3.0   CALCIUM mg/dL 41.3*   < >  --    < > 11.7*   CRP mg/L 16.0*  --   --   --   --     < > = values in this interval not displayed.       Microbiology:  Microbiology Results (last day)       Procedure Component Value Date/Time Date/Time    Pneumocystis PCR [[id number]] Collected: 03/05/23 0409    Lab Status: No result Specimen: SPUTUM EXPECTORATED     Blood Culture [[id number]]  (Normal) Collected: 03/02/23 0057    Lab Status: Preliminary result Specimen: Blood from 1 Peripheral Draw Updated: 03/05/23 0145     Blood Culture, Routine No Growth at 72 hours    Blood Culture [[id number]]  (Normal) Collected: 03/02/23 0056    Lab Status: Preliminary result Specimen: Blood from 1 Peripheral Draw Updated: 03/05/23 0145     Blood Culture, Routine No Growth at 72 hours            Imaging:  Echocardiogram Follow Up/Limited Echo  Result Date: 03/04/2023  Patient Info Name:     Jaymz Maddaloni Age:     42 years DOB:     [birth year] Gender:  Male MRN:     [id number] Accession #:     [id number] [place] Account #:     [id number] Ht:     170 cm Wt:     73 kg BSA:     1.87 m2 BP:     128 /     99 mmHg Exam Date:     03/02/2023 2:15 PM Admit Date:     03/01/2023     Exam Type:     ECHOCARDIOGRAM FOLLOW UP/LIMITED ECHO     Technical Quality:     Fair     Staff Sonographer:     Otho Perl     Study Info Indications      - ELEVATED BNP,  evaluate for RV strain Procedure(s)   Limited color flow, 2D and Doppler transthoracic echocardiogram is performed.         Summary   1. Limited study to assess ventricular function.   2. The left ventricle is normal in size with severely increased wall thickness.   3. The left ventricular systolic function is hyperdynamic, LVEF is visually estimated at >70%.   4. The mitral valve leaflets are moderately thickened with normal leaflet mobility.   5. Mitral annular calcification is present (moderate).   6. There is likely moderate mitral stenosis, with interpretation limited in the setting of elevated heart rate and significant mitral annular calcification.   7. The right ventricle is normal in size, with normal systolic function.         Left Ventricle   The left ventricle is normal in size with severely increased wall thickness. The left ventricular systolic function is hyperdynamic, LVEF is visually estimated at >70%. There is a mild increase in the left ventricular intracavitary gradient at rest. Peak gradient 25 mm Hg on this study. Left ventricular diastolic function cannot be accurately assessed. Longitudinal strain of the left ventricle is severely decreased, -6.0 %. Strain imaging demonstrates preserved strain at the LV apex.     Right Ventricle   The right ventricle is normal in size, with normal systolic function.         Aortic Valve   The aortic valve is trileaflet with mildly thickened leaflets with normal excursion. There is no significant aortic regurgitation. There is no evidence of a significant transvalvular gradient.     Mitral Valve   The mitral valve leaflets are moderately thickened with normal leaflet mobility. Mitral annular calcification is present (moderate). There is no significant mitral valve regurgitation. There is likely moderate mitral stenosis, with interpretation limited in the setting of elevated heart rate and significant mitral annular calcification. Peak velocity:  2.3 m/sec. Mean diastolic gradient: 13 mmHg at a heart rate of 101 bpm.     Tricuspid Valve   The tricuspid valve leaflets are poorly visualized but probably normal, with normal leaflet mobility. There is no significant tricuspid regurgitation. The pulmonary systolic pressure cannot be estimated due to insufficient TR signal.     Pulmonic Valve   The pulmonic valve is thickened.         Inferior Vena Cava   IVC size and inspiratory change suggest normal right atrial pressure. (0-5 mmHg).     Other Findings   Rhythm: Sinus Rhythm.         Ventricles ---------------------------------------------------------------------- Name                                 Value  Normal ----------------------------------------------------------------------     LV Dimensions 2D/MM ----------------------------------------------------------------------  IVS Diastolic Thickness (2D)                                1.8 cm       0.6-1.0 LVID Diastole (2D)                  4.8 cm       4.2-5.8  LVPW Diastolic Thickness (2D)                                1.6 cm       0.6-1.0 LVID Systole (2D)                   2.8 cm       2.5-4.0 LV Mass Index (2D Cubed)          196 g/m2        49-115  Relative Wall Thickness (2D)                                  0.67        <=0.42     RV Dimensions 2D/MM ---------------------------------------------------------------------- TAPSE                               2.0 cm         >=1.7     Left Ventricular Outflow Tract ---------------------------------------------------------------------- Name                                 Value        Normal ----------------------------------------------------------------------     LVOT Doppler ---------------------------------------------------------------------- LVOT Peak Velocity                 2.5 m/s               LVOT VTI                             38 cm               MV VTI/LVOT VTI Ratio                 1.08     Aortic Valve ---------------------------------------------------------------------- Name                                 Value        Normal ----------------------------------------------------------------------     AV Doppler ---------------------------------------------------------------------- AV Peak Velocity                   2.1 m/s               AV Peak Gradient                   17 mmHg               AV Mean Gradient                    8 mmHg  AV VTI                               34 cm               AV DI (Vel)                           1.20               AV DI (VTI)                           1.12     Mitral Valve ---------------------------------------------------------------------- Name                                 Value        Normal ----------------------------------------------------------------------     MV Doppler ---------------------------------------------------------------------- MV Peak Velocity                   2.3 m/s               MV Mean Gradient                   13 mmHg                   MV Diastolic Function ---------------------------------------------------------------------- MV E Peak Velocity                148 cm/s               MV A Peak Velocity                245 cm/s               MV E/A                                 0.6                   MV Annular TDI ---------------------------------------------------------------------- MV Septal e' Velocity            10.9 cm/s         >=8.0 MV E/e' (Septal)                      13.6               MV Lateral e' Velocity            4.5 cm/s        >=10.0 MV E/e' (Lateral)                     33.2               MV e' Average                     7.7 cm/s               MV E/e' (Average)                     23.4     Tricuspid Valve ---------------------------------------------------------------------- Name  Value        Normal ----------------------------------------------------------------------     Estimated PAP/RSVP ---------------------------------------------------------------------- RA Pressure                         3 mmHg           <=5         Report Signatures Finalized by Hart Carwin  MD on 03/04/2023 07:30 AM Resident Cleaster Corin on 03/02/2023 03:45 PM    PVL Venous Duplex Lower Extremity Bilateral  Result Date: 03/02/2023   Peripheral Vascular Lab     [address]   [place], [place] [address]  PVL VENOUS DUPLEX LOWER EXTREMITY BILATERAL Patient Demographics Pt. Name: TARL MARKSBERRY Location: [facility] Inpatient Lab MRN:      [id number]        Sex:      M DOB:      05-Mar-1980     Age:      24 years  Study Information Authorizing         [id number] [facility]   Performed Time       03/02/2023 Provider Name       [facility]                                    11:37:37 AM Ordering Physician  Alvino Chapel Patient Location     [facility] Accession Number    [id number] [place]         Technologist         Marcy Siren                                                                 RVT Diagnosis:                                 Assisting                                            Technologist Ordered Reason For Exam: hx chronic DVT LLE femoral artery Indication: Risk Factors: DVT (Prior h/o LLE DVT ). Anticoagulation: (Heparin). Protocol The major deep veins from the inguinal ligament to the ankle are assessed for bilaterally for compressibility and color and spectral Doppler flow characteristics. The assessed veins include bilateral common femoral vein, femoral vein in the thigh, popliteal vein, and intramuscular calf veins. The iliac vein is assessed indirectly using Doppler waveform analysis. The great saphenous vein is assessed for compressibility at the saphenofemoral junction, and the small saphenous vein assessed for compressibility behind the knee.  Final Interpretation Right There is no evidence of DVT in the lower extremity. There is no evidence of obstruction proximal to the inguinal ligament or in the common femoral vein. Left Abnormalities consistent with the sequela of a prior venous obstructive process, with findings that appear chronic/long standing in nature are identified in the intramuscular calf veins, calf veins, and proximal veins.  Electronically signed by [id number] Jodell Cipro MD on 03/02/2023 at 4:14:54  PM.  -------------------------------------------------------------------------------- Right Duplex Findings All veins visualized appear fully compressible. Doppler flow signals demonstrate normal spontaneity, phasicity, and augmentation.  Left Duplex Findings The left soleal vein, peroneal vein, popliteal vein and femoral vein at distal thigh is partially compressible and appears rigid with compression, brightly echogenic and with striations. All other veins visualized appear fully compressible and demonstrate appropriate Doppler characteristics. Right Technical Summary No evidence of deep venous obstruction in the lower extremity. No indirect evidence of obstruction proximal to the inguinal ligament. Left Technical Summary Evidence of chronic obstruction in the left soleal vein, peroneal vein, popliteal vein and femoral vein at distal thigh. All other veins visualized appear fully compressible and demonstrate appropriate Doppler characteristics. Final

## 2023-03-05 NOTE — Unmapped (Signed)
[facility]  Kidney Transplant Biopsy Note    Date of Biopsy Referral: 03/05/23    Referring Transplant Provider: Denu-Ciocca  Pager: [phone]  Phone: [phone]    Kidney Transplant Coordinator: Celene Squibb     Biopsy Fellow  _____________________ at _______________________ otherwise contact referring provider.     (NAME)   (CONTACT - pager/cell phone #)    ---------------------------------------------------------------------------------------------------------------------  PLEASE INDICATED BIOPSY URGENCY: [facility] OR NEXT DAY READ       Patient Name: Christian Brown  MR: [id number]  Age: 43 y.o.  Sex: Male Race: Other Race Ethnicity: Not Hispanic, Latino/a, or Spanish origin   DOB:06-14-1980    Procedures: Ultrasound Guided Percutaneous Kidney Biopsy under Moderate Sedation  Tissue Submitted: Kidney  Special Studies Required: LM, IF, EM  ----------------------------------------------------------------------------------------------------------------------  /Date of allograft implantation: 09/05/2022  ABO Incompatible: No  Underlying native kidney disease: FSGS/HIVAN  Was the diagnosis established by biopsy? No  Previous transplant biopsies: No  If yes, what were the previous diagnoses?   Previous kidney transplants: No If yes, this is #:     Donor Information (if available)  Age of donor:   Gender:   Race:  Type of donor: Cadaveric  Ischemia time: 40  Delayed graft function: No  If yes, how many days on dialysis:     History/Clinical Diagnosis/Indication for Biopsy: Patient is a 43 year old man admitted with few weeks of cough and found to have multifocal pneumonia and AKI. Patient with tertiary hyperparathyroidism and hypercalcemia. Despite treatment of pneumonia creatinine has not improved.     ----------------------------------------------------------------------------------------------------------------------  Current Baseline Immunosuppression: prednisone, tacrolimus (Prograf or Envarsus), and azathioprine (please select ALL drugs used)  Specific anti-rejection treatment WITHIN 2 WEEKS before biopsy: No  If yes, what was the type of treatment?     Patient off immunosuppression?: No  Patient seems adherent to immunosuppression? Yes  Patient is currently back on dialysis? No  Has patient had any prior episodes of rejection? No   If yes, what was the treatment?   ----------------------------------------------------------------------------------------------------------------------  Donor Specific Antibody Results:  Lab Results   Component Value Date    Donor ID [id number] 03/02/2023    Donor HLA-A Antigen #1 A2 03/02/2023    Anti-Donor HLA-A #1 MFI 0 03/02/2023    Donor HLA-A Antigen #2 A26 03/02/2023    Donor HLA-B Antigen #1 B8 03/02/2023    Anti-Donor HLA-B #1 MFI 0 03/02/2023    Donor HLA-B Antigen #2 B62 03/02/2023    Anti-Donor HLA-B #2 MFI 0 03/02/2023    Donor HLA-C Antigen #1 C9 03/02/2023    Anti-Donor HLA-C #1 MFI 0 03/02/2023    Donor HLA-C Antigen #2 C7 03/02/2023    Donor HLA-DR Antigen #1 DR17 03/02/2023    Anti-Donor HLA-DR #1 MFI 2 03/02/2023    Donor HLA-DR Antigen #2 DR11 03/02/2023    Anti-Donor HLA-DR #2 MFI 16 03/02/2023    Donor DRw Antigen #1 DR52 03/02/2023    Anti-Donor DRw #1 MFI 1 03/02/2023    Donor HLA-DQB Antigen #1 DQ2 03/02/2023    Anti-Donor HLA-DQB #1 MFI 5 03/02/2023    Donor HLA-DQB Antigen #2 DQ7 03/02/2023    Anti-Donor HLA-DQB #2 MFI 176 03/02/2023    DSA Comment  03/02/2023      Comment:      The HLA antigens listed are donor antigens and the corresponding MFI value.  MFI values >= 1000 are considered positive.  A negative result does not exclude the presence of  low level DSA.  Blank donor antigen and MFI fields indicate unavailable donor HLA   type or lack of appropriate single antigen bead to determine DSA.  As such, the presence or absence of DSA to the locus cannot be confirmed.     Donor specific antibody (DSA) testing is performed with a  solid phase multiplex single antigen bead array method.  All procedures and reagents have been validated and performance characteristics determined by the [facility].    Certain of these tests have not been cleared/approved by the U.S. Food and Drug Administration (FDA).  The FDA has determined that such approval/clearance is not necessary because this laboratory is certified under the Clinical Laboratory Improvements   Amendments to perform high complexity testing.  This test is used for clinical purposes.  It should not be regarded as investigational or for research.  All HLA typings are performed using molecular methodologies.  HLA-A, B, C, DR, and DQ results are   serological equivalents of the molecular type.         If Outside DSA Results: Class 1 No Class 2 No (Delete if not performed at outside lab)    Blood Pressure (mmHg):   BP Readings from Last 3 Encounters:   03/05/23 156/118   02/02/23 135/90   02/02/23 135/90     On Anti-hypertensive Therapy: Yes    Urinalysis:  Lab Results   Component Value Date    Color, UA Colorless 03/01/2023    Color, UA Yellow 01/29/2023    Specific Gravity, UA 1.009 03/01/2023    Specific Gravity, UA 1.017 01/29/2023    pH, UA 6.5 03/01/2023    pH, UA 7.0 01/29/2023    Glucose, UA Negative 03/01/2023    Glucose, UA Negative 01/29/2023    Ketones, UA Negative 03/01/2023    Ketones, UA Negative 01/29/2023    Blood, UA Negative 03/01/2023    Blood, UA Negative 01/29/2023    Nitrite, UA Negative 03/01/2023    Nitrite, UA Negative 01/29/2023    Leukocyte Esterase, UA Negative 03/01/2023    Leukocyte Esterase, UA Negative 01/29/2023    Urobilinogen, UA <2.0 mg/dL 09/81/1914    Urobilinogen, UA 1.0 01/29/2023    Bilirubin, UA Negative 03/01/2023    Bilirubin, UA Negative 01/29/2023    WBC, UA 0-5 01/29/2023    RBC, UA 11-30 (A) 01/29/2023     Urine protein/creatinine ratio:   Lab Results   Component Value Date    Protein/Creatinine Ratio, Urine 0.332 12/14/2022    2.69(enter if UPCR from outside lab, otherwise delete)    Creatinine (present peak): 2.69  Creatinine (baseline): 1.5 - 1.7  Lab Results   Component Value Date    CREATININE 2.69 (H) 03/05/2023    CREATININE 2.69 (H) 03/04/2023    CREATININE 2.60 (H) 03/03/2023    CREATININE 2.44 (H) 03/03/2023    CREATININE 2.40 (H) 03/02/2023       Glucose:   Lab Results   Component Value Date    Glucose 101 03/05/2023     HGBA1C:   Lab Results   Component Value Date    Hemoglobin A1C 4.5 (L) 12/14/2022     ----------------------------------------------------------------------------------------------------------------------  Clinical signs of infections at time of current biopsy:  BK: No   BK blood viral load:   Lab Results   Component Value Date    BK Blood Result Not Detected 03/02/2023      Outside BK Blood Viral Load:  Urine decoy cells present:  CMV: No   CMV viral load:   Lab Results   Component Value Date    CMV Viral Ld Not Detected 03/02/2023    CMV Quant Positive < 200 01/29/2023    CMV Quant Log(10) 1.67 (H) 11/02/2022      Outside CMV Blood Viral Load:    EBV:No   EBV viral load:     HSV: No  Hepatitis B: No  Hepatitis C: No  Bacteria: No  Fungi: No  Urinary tract infection: No  Other: multifocal pneumonia, hypercalcemia   ----------------------------------------------------------------------------------------------------------------------  Stenosis of renal artery: No  Obstruction of ureter: No  Lymphocele: No

## 2023-03-06 DIAGNOSIS — Z940 Kidney transplant status: Principal | ICD-10-CM

## 2023-03-06 LAB — HEPATIC FUNCTION PANEL
ALBUMIN: 2.9 g/dL — ABNORMAL LOW (ref 3.4–5.0)
ALKALINE PHOSPHATASE: 431 U/L — ABNORMAL HIGH (ref 46–116)
ALT (SGPT): <7 U/L — ABNORMAL LOW (ref 10–49)
AST (SGOT): 13 U/L (ref <=34)
BILIRUBIN DIRECT: 0.1 mg/dL (ref 0.00–0.30)
BILIRUBIN TOTAL: 0.5 mg/dL (ref 0.3–1.2)
PROTEIN TOTAL: 7 g/dL (ref 5.7–8.2)

## 2023-03-06 LAB — CBC
HEMATOCRIT: 33.1 % — ABNORMAL LOW (ref 39.0–48.0)
HEMOGLOBIN: 11.2 g/dL — ABNORMAL LOW (ref 12.9–16.5)
MEAN CORPUSCULAR HEMOGLOBIN CONC: 34 g/dL (ref 32.0–36.0)
MEAN CORPUSCULAR HEMOGLOBIN: 35.6 pg — ABNORMAL HIGH (ref 25.9–32.4)
MEAN CORPUSCULAR VOLUME: 104.8 fL — ABNORMAL HIGH (ref 77.6–95.7)
MEAN PLATELET VOLUME: 7.7 fL (ref 6.8–10.7)
PLATELET COUNT: 187 10*9/L (ref 150–450)
RED BLOOD CELL COUNT: 3.16 10*12/L — ABNORMAL LOW (ref 4.26–5.60)
RED CELL DISTRIBUTION WIDTH: 14.4 % (ref 12.2–15.2)
WBC ADJUSTED: 2.4 10*9/L — ABNORMAL LOW (ref 3.6–11.2)

## 2023-03-06 LAB — APTT
APTT: 31.5 s (ref 24.8–38.4)
HEPARIN CORRELATION: 0.2

## 2023-03-06 LAB — BASIC METABOLIC PANEL
ANION GAP: 13 mmol/L (ref 5–14)
BLOOD UREA NITROGEN: 27 mg/dL — ABNORMAL HIGH (ref 9–23)
BUN / CREAT RATIO: 9
CALCIUM: 12.1 mg/dL (ref 8.7–10.4)
CHLORIDE: 110 mmol/L — ABNORMAL HIGH (ref 98–107)
CO2: 22 mmol/L (ref 20.0–31.0)
CREATININE: 2.92 mg/dL — ABNORMAL HIGH (ref 0.73–1.18)
EGFR CKD-EPI (2021) MALE: 27 mL/min/{1.73_m2} — ABNORMAL LOW (ref >=60)
GLUCOSE RANDOM: 101 mg/dL — ABNORMAL HIGH (ref 70–99)
POTASSIUM: 4.3 mmol/L (ref 3.4–4.8)
SODIUM: 145 mmol/L (ref 135–145)

## 2023-03-06 LAB — PHOSPHORUS: PHOSPHORUS: 3.2 mg/dL (ref 2.4–5.1)

## 2023-03-06 LAB — PROTIME-INR
INR: 1.11
PROTIME: 12.7 s — ABNORMAL HIGH (ref 9.9–12.6)

## 2023-03-06 LAB — MAGNESIUM: MAGNESIUM: 1.7 mg/dL (ref 1.6–2.6)

## 2023-03-06 LAB — ASPERGILLUS GALACTOMANNAN ANTIGEN, SERUM: ASPERGILLUS AG INTERP: NEGATIVE (ref <0.5)

## 2023-03-06 LAB — IONIZED CALCIUM VENOUS
CALCIUM IONIZED VENOUS (MG/DL): <0.9 mg/dL — CL (ref 4.40–5.40)
CALCIUM IONIZED VENOUS (MG/DL): 6.87 mg/dL (ref 4.40–5.40)

## 2023-03-06 LAB — HIV RNA, QUANTITATIVE, PCR
HIV RNA QNT RSLT: DETECTED — AB
HIV RNA: <20 {copies}/mL — ABNORMAL HIGH (ref <0)

## 2023-03-06 MED ADMIN — fentaNYL (PF) (SUBLIMAZE) injection: INTRAVENOUS | @ 21:00:00 | Stop: 2023-03-06

## 2023-03-06 MED ADMIN — midazolam (VERSED) injection: INTRAVENOUS | @ 21:00:00 | Stop: 2023-03-06

## 2023-03-06 MED ADMIN — predniSONE (DELTASONE) tablet 5 mg: 5 mg | ORAL | @ 17:00:00

## 2023-03-06 MED ADMIN — midazolam (VERSED) injection 1 mg: 1 mg | INTRAVENOUS | @ 21:00:00 | Stop: 2023-03-06

## 2023-03-06 MED ADMIN — acetaminophen (TYLENOL) tablet 650 mg: 650 mg | ORAL | @ 04:00:00

## 2023-03-06 MED ADMIN — carvedilol (COREG) tablet 25 mg: 25 mg | ORAL | @ 01:00:00

## 2023-03-06 MED ADMIN — azathioprine (IMURAN) tablet 150 mg: 150 mg | ORAL | @ 01:00:00

## 2023-03-06 MED ADMIN — minoxidil (LONITEN) tablet 2.5 mg: 2.5 mg | ORAL | @ 15:00:00

## 2023-03-06 MED ADMIN — bictegrav-emtricit-tenofov ala (BIKTARVY) 50-200-25 mg tablet 1 tablet: 1 | ORAL | @ 17:00:00

## 2023-03-06 MED ADMIN — tacrolimus (PROGRAF) 11 mg combo product: 11 mg | ORAL | @ 14:00:00

## 2023-03-06 MED ADMIN — acetaminophen (TYLENOL) tablet 650 mg: 650 mg | ORAL | @ 17:00:00

## 2023-03-06 MED ADMIN — levoFLOXacin (LEVAQUIN) tablet 750 mg: 750 mg | ORAL | @ 10:00:00 | Stop: 2023-03-06

## 2023-03-06 MED ADMIN — oxyCODONE (ROXICODONE) immediate release tablet 5 mg: 5 mg | ORAL | @ 05:00:00 | Stop: 2023-03-05

## 2023-03-06 MED ADMIN — carvedilol (COREG) tablet 25 mg: 25 mg | ORAL | @ 15:00:00

## 2023-03-06 MED ADMIN — sodium chloride 3 % NEBULIZER solution 4 mL: 4 mL | RESPIRATORY_TRACT | @ 01:00:00 | Stop: 2023-03-05

## 2023-03-06 MED ADMIN — lidocaine (XYLOCAINE) 10 mg/mL (1 %) injection: INTRADERMAL | @ 21:00:00 | Stop: 2023-03-06

## 2023-03-06 MED ADMIN — acetaminophen (TYLENOL) tablet 650 mg: 650 mg | ORAL | @ 13:00:00

## 2023-03-06 MED ADMIN — pantoprazole (Protonix) EC tablet 40 mg: 40 mg | ORAL | @ 01:00:00

## 2023-03-06 MED ADMIN — tacrolimus (PROGRAF) 11 mg combo product: 11 mg | ORAL | @ 01:00:00

## 2023-03-06 MED ADMIN — pantoprazole (Protonix) EC tablet 40 mg: 40 mg | ORAL | @ 17:00:00

## 2023-03-06 MED ADMIN — albuterol nebulizer solution 2.5 mg: 2.5 mg | RESPIRATORY_TRACT | @ 01:00:00 | Stop: 2023-03-05

## 2023-03-06 MED FILL — VENTOLIN HFA 90 MCG/ACTUATION AEROSOL INHALER: RESPIRATORY_TRACT | 17 days supply | Qty: 18 | Fill #3

## 2023-03-06 NOTE — Unmapped (Signed)
Patient c/o headache this morning. Tylenol given with some relief, bp is stable, no blurry vision noted, [facility] residents aware. Patient ambulates in the room with no issue, pending renal US. Patient's mom at the bedside, call bell within reach.    Problem: Adult Inpatient Plan of Care  Goal: Plan of Care Review  Outcome: Ongoing - Unchanged  Goal: Patient-Specific Goal (Individualized)  Outcome: Ongoing - Unchanged  Goal: Absence of Hospital-Acquired Illness or Injury  Outcome: Ongoing - Unchanged  Intervention: Identify and Manage Fall Risk  Recent Flowsheet Documentation  Taken 03/05/2023 0800 by Valentino Nose, RN  Safety Interventions:   fall reduction program maintained   low bed   no IV/BP/blood draw left arm   nonskid shoes/slippers when out of bed  Goal: Optimal Comfort and Wellbeing  Outcome: Ongoing - Unchanged  Goal: Readiness for Transition of Care  Outcome: Ongoing - Unchanged  Goal: Rounds/Family Conference  Outcome: Ongoing - Unchanged     Problem: Wound  Goal: Optimal Coping  Outcome: Ongoing - Unchanged  Goal: Optimal Functional Ability  Outcome: Ongoing - Unchanged  Goal: Absence of Infection Signs and Symptoms  Outcome: Ongoing - Unchanged  Goal: Improved Oral Intake  Outcome: Ongoing - Unchanged  Goal: Optimal Pain Control and Function  Outcome: Ongoing - Unchanged  Goal: Skin Health and Integrity  Outcome: Ongoing - Unchanged  Intervention: Optimize Skin Protection  Recent Flowsheet Documentation  Taken 03/05/2023 0800 by Valentino Nose, RN  Head of Bed (HOB) Positioning: HOB at 20 degrees  Goal: Optimal Wound Healing  Outcome: Ongoing - Unchanged

## 2023-03-06 NOTE — Unmapped (Signed)
VENOUS ACCESS ULTRASOUND PROCEDURE NOTE    Indications:   Poor venous access.    The Venous Access Team has assessed this patient for the placement of a PIV. Ultrasound guidance was necessary to obtain access.     Procedure Details:  Identity of the patient was confirmed via name, medical record number and date of birth. The availability of the correct equipment was verified.    The vein was identified for ultrasound catheter insertion.  Field was prepared with necessary supplies and equipment.  Probe cover and sterile gel utilized.  Insertion site was prepped with chlorhexidine solution and allowed to dry.  The catheter extension was primed with normal saline. A(n) 22 gauge 1.75 catheter was placed in the R Forearm with 2attempt(s). See LDA for additional details.    Catheter aspirated, 2 mL blood return present. The catheter was then flushed with 10 mL of normal saline. Insertion site cleansed, and dressing applied per manufacturer guidelines. The catheter was inserted without difficulty by Richardine Service, RN.    Primary RN was notified.     Thank you,     Richardine Service, RN Venous Access Team   [phone]     Workup / Procedure Time:  30 minutes    See images below:

## 2023-03-06 NOTE — Unmapped (Signed)
Assessment/Plan:    Mr. Jarret is a 43 y.o. male who will undergo Ultrasound-guided transplant kidney biopsy    1. Indications and risks/benefits of procedure reviewed with patient.    2. Consent signed and present on patient's charge.   3. No cardiopulmonary or other medical contraindications present therefore will proceed with biopsy.   4.  Surgical pathology has been consulted.  5. BUN is 27 today, so the patient will not receive DDAVP.      CC: [facility] Biopsy Indication: Elevated Creatinine/Acute Kidney Injury    HPI: Mr. Zola is a 43 y.o. male who will undergo Ultrasound-guided transplant kidney biopsy with moderate sedation.    Pt states he is doing well today and would like to proceed with the renal biopsy. Consented and all questions answered. Pt states last renal bx was of his native kidneys and he had no complications with sedation or procedure. He has been NPO and no AC or dvt ppx noted in chart since 1/2. Pt able to takie medications without issues but has been denying medications often for various preferred reasons.     Allergies:  Amlodipine, Doxercalciferol, Fish containing products, Iron sucrose, Labetalol, Metoclopramide, Metoprolol, Nifedipine, Other, Sulfamethoxazole-trimethoprim, Vancomycin, Calcitonin, Peanut oil, Potassium clavulanate, Azathioprine, Clindamycin, Hydralazine, and Sensipar [cinacalcet]    Medications:   Prior to Admission medications    Medication Sig Start Date End Date Taking? Authorizing Provider   albuterol HFA 90 mcg/actuation inhaler Inhale 1-2 puffs every four (4) to six (6) hours as needed for wheezing or shortness of breath. 11/27/22   Leafy Half, MD   azathioprine (AZASAN) 100 mg tablet Take 1 tablet (100 mg total) by mouth daily. Take in addition to 50 mg tablet for a total daily dose of 150 mg daily 11/27/22 11/27/23  Leafy Half, MD   azathioprine (IMURAN) 50 mg tablet Take 1 tablet (50 mg total) by mouth daily. Take in addition to 100 mg tablet for a total daily dose to of 150 mg daily 11/27/22 11/27/23  Leafy Half, MD   bictegrav-emtricit-tenofov ala (BIKTARVY) 50-200-25 mg tablet Take 1 tablet by mouth every morning. 09/21/22   Mathis Dad, MD   carvedilol (COREG) 12.5 MG tablet Take 1 tablet (12.5 mg total) by mouth two (2) times a day. 12/18/22 12/18/23  Leafy Half, MD   DIETARY SUPPLEMENT,MISC [id number] ORAL Taking magnesium gummy supplement once daily. Dosage strength & product unknown    [provider]   diphenhydrAMINE (BENADRYL) 25 mg capsule Take 1 capsule (25 mg total) by mouth every six (6) hours as needed for itching. 10/09/22   Leafy Half, MD   empty container Misc Use as directed with Zarxio 11/16/22      filgrastim-sndz (ZARXIO) 480 mcg/0.8 mL Syrg Inject 0.8 mL (480 mcg total) under the skin once a week. Administer only as directed by transplant team. 11/09/22   Chargualaf, Lorel Monaco, CPP   HYDROcodone-acetaminophen (NORCO) 5-325 mg per tablet Take 1 tablet by mouth daily as needed for pain.    [provider]   pantoprazole (PROTONIX) 40 MG tablet Take 1 tablet (40 mg total) by mouth two (2) times a day. 10/09/22   Kotzen, Francine Graven, MD   predniSONE (DELTASONE) 5 MG tablet Take 1 tablet (5 mg total) by mouth in the morning. 11/27/22 11/27/23  Leafy Half, MD   tacrolimus (PROGRAF) 5 MG capsule Take 2 capsules (10 mg total) by mouth two (2) times a day. 02/05/23 02/05/24  Kotzen,  Francine Graven, MD       Medical History:  Past Medical History:   Diagnosis Date    Acquired immunodeficiency syndrome (CMS-HCC) 11/14/2013    Formatting of this note might be different from the original.   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   He appears to be doing well. States he is back to 90% except for his leg swelling.    Will change his epivir to 150 mg daily when he completes his liquid rx. Will see him back in 1 month and repeat his labs th    Acute deep vein thrombosis (DVT) of popliteal vein of left lower extremity (CMS-HCC) 02/05/2019    Last Assessment & Plan:   Formatting of this note might be different from the original.  Moderate effusion left knee new the some limitation of motion.  No evidence of sepsis the white count the normal  will     Proceed on with an aspiration for cell count, culture and crystal analysis.  New x-rays ordered and currently pending will follow.  Further therapeutic recommendations contingent on the re    Anemia     Asthma     Candidal esophagitis (CMS-HCC) 07/30/2022    Community acquired pneumonia 07/30/2014    DVT (deep venous thrombosis) (CMS-HCC) 11/24/2013    Last Assessment & Plan:    Formatting of this note might be different from the original.   Appears to be doing better (swelling improved). He will f/u with PCP regarding coumadin dosing (INR is low).  Formatting of this note might be different from the original.   Last Assessment & Plan:    Formatting of this note might be different from the original.   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   greatly appreciate renal f/u.  Formatting of this note might be different from the original.   Last Assessment & Plan:    greatly appreciate renal f/u.   Last Assessment & Plan:    greatly appreciate renal f/u.  Formatting of this note might be different from the original.   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    Rectal bleeding 04/23/2019    Sleep apnea        Surgical History:  Past Surgical History:   Procedure Laterality Date    INTUSSUSCEPTION REPAIR      PERICARDIAL WINDOW      PR AUTOTRANSPLANT, PARATHYROID Bilateral 06/17/2019    Procedure: PARATHYROID AUTOTRANSPLANTATION;  Surgeon: Karren Burly, MD;  Location: MAIN OR [place];  Service: ENT    PR CREAT AV FISTULA,NON-AUTOGENOUS GRAFT Left 07/05/2022    Procedure: CREATE AV FISTULA (SEPARATE PROC); NONAUTOGENOUS GRAFT (EG, BIOLOGICAL COLLAGEN, THERMOPLASTIC  GRAFT), UPPER EXTREMITY;  Surgeon: Toledo, Lilyan Punt, MD;  Location: MAIN OR [facility];  Service: Transplant    PR EXCISION TURBINATE,SUBMUCOUS Bilateral 10/22/2020    Procedure: SUBMUCOUS RESECTION INFERIOR TURBINATE, PARTIAL OR COMPLETE, ANY METHOD;  Surgeon: Maeola Sarah, MD;  Location: OR [facility];  Service: ENT    PR EXPLORE PARATHYROID GLANDS Bilateral 06/17/2019    Procedure: PARATHYROIDECTOMY OR EXPLORATION OF PARATHYROID(S);  Surgeon: Karren Burly, MD;  Location: MAIN OR [facility];  Service: ENT    PR REPAIR OF NASAL SEPTUM N/A 10/22/2020    Procedure: SEPTOPLASTY OR SUBMUCOUS RESECTION, WITH OR WITHOUT CARTILAGE SCORING, CONTOURING OR REPLACEMENT WITH GRAFT;  Surgeon: Maeola Sarah, MD;  Location: OR [facility];  Service: ENT    PR REVISE AV FISTULA,W/O THROMBECTOMY Left 06/06/2022    Procedure: REVISON, OPEN, ARTERIOVENOUS FISTULA; WITHOUT THROMBECTOMY, AUTOGENOUS OR NONAUTOGENOUS DIALYSIS GRAFT;  Surgeon: Toledo, Lilyan Punt, MD;  Location: MAIN OR [facility];  Service: Transplant    PR REVISE AV FISTULA,W/O THROMBECTOMY Left 07/05/2022    Procedure: REVISON, OPEN, ARTERIOVENOUS FISTULA; WITHOUT THROMBECTOMY, AUTOGENOUS OR NONAUTOGENOUS DIALYSIS GRAFT;  Surgeon: Toledo, Lilyan Punt, MD;  Location: MAIN OR [facility];  Service: Transplant    PR RIGHT HEART CATH O2 SATURATION & CARDIAC OUTPUT N/A 07/20/2020    Procedure: Right Heart Catheterization with PULMONARY ANGIOGRAM;  Surgeon: Marlaine Hind, MD;  Location: [facility] CATH;  Service: Cardiology    PR TRANSPLANT,PREP RENAL GRAFT/ARTERIAL N/A 09/05/2022    Procedure: [facility] RECONSTRUCTION CADAVER/LIVING DONOR RENAL ALLOGRAFT PRIOR TO TRANSPLANT; ARTERIAL ANASTOMOSIS EAC;  Surgeon: Toledo, Lilyan Punt, MD;  Location: MAIN OR [facility];  Service: Transplant    PR TRANSPLANTATION OF KIDNEY N/A 09/05/2022    Procedure: RENAL ALLOTRANSPLANTATION, IMPLANTATION OF GRAFT; WITHOUT RECIPIENT NEPHRECTOMY;  Surgeon: Toledo, Lilyan Punt, MD;  Location: MAIN OR [facility];  Service: Transplant    PR UPPER GI ENDOSCOPY,BIOPSY N/A 07/31/2022    Procedure: UGI ENDOSCOPY; WITH BIOPSY, SINGLE OR MULTIPLE;  Surgeon: Janace Aris, MD;  [facility];  Service: Gastroenterology    PR UPPER GI ENDOSCOPY,BIOPSY N/A 09/12/2022    Procedure: UGI ENDOSCOPY; WITH BIOPSY, SINGLE OR MULTIPLE;  Surgeon: Zetta Bills, MD;  Location: [facility];  Service: Gastroenterology    UMBILICAL HERNIA REPAIR         Social History:  Tobacco use:   reports that he has never smoked. He has never used smokeless tobacco.  Alcohol use:   reports that he does not currently use alcohol.  Drug use:  reports no history of drug use.    Family History:  The patient's family history includes Arthritis in his mother; Hypertension in his father and mother..    ROS:  10 systems reviewed and are negative unless otherwise mentioned in HPI    ASA Grade: ASA 2 - Patient with mild systemic disease with no functional limitations      PE:    Vitals:    03/06/23 0000   BP:    Pulse: 88   Resp:    Temp:    SpO2:      General:  well appear male in NAD.  Airway assessment: Class 2 - Can visualize soft palate and fauces, tip of uvula is obscured  Cardiovascular:  Regular rate  Lungs: respirations nonlabored

## 2023-03-06 NOTE — Unmapped (Signed)
Nephrology (MEDB) Progress Note    Assessment & Plan:   Christian Brown is a 43 y.o. 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)    HIV nephropathy (CMS-HCC)    Chronic deep vein thrombosis (DVT) of femoral vein of left lower extremity (CMS-HCC)    CAP (community acquired pneumonia)    Mild intermittent asthma without complication    Pulmonary hypertension (CMS-HCC)    Iron deficiency anemia    History of DVT (deep vein thrombosis)    Anemia due to stage 4 chronic kidney disease (CMS-HCC)    Secondary renal hyperparathyroidism (CMS-HCC)    Hypercalcemia due to chronic kidney disease    Hemodialysis AV fistula thrombosis (CMS-HCC)      Active Problems    Acute Kidney Injury on CKD - 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 Follows with transplant nephrologist [name] (last seen 02/02/2023). Baseline creatinine is 1.5-1.7, elevated on admission to 2.47. UA with rare bacteria and mucus, no protein. Renal transplant ultrasound with new mild right hydronephrosis w/ mildly elevated resistive indices improved from previous study and nonobstructing subcentimeter stone. AKI differential including acute renal transplant rejection, prerenal AKI due to dehydration/hypercalcemia (but not more elevated than it has been in the past). With immunocompromised state HIV, CMV, EBV, BK Virus, and Syphilis, but CMV, EBV, BK, RPR negative. Microscopic evaluation on 1/5 of urine with up to 10 RBC/HPF, spurred but no acanthocytes, occasional WBC and RTE's. HLA DSA reassuringly negative. Cr elevated but stable on 1/6, however has continued to rise on 1/7. With unclear cause of AKI, planning for renal biopsy with VIR on 1/7.  - VIR consulted, appreciate recommendations.    - avoid nephrotoxic agents   - daily renal function panel   - SPEP/IFE pending  - continue home immunosuppression with tacrolimus 10mg  PO BID, azathioprine 105mg  PO daily, and prednisone 5mg  PO daily  (initially on MMF, but stopped 2/2 pill esophagitis/gastitis)    C/f Multifocal Pneumonia - Hx of PJP Pneumonia - Productive Cough (improving)   One month of shortness of breath along with productive cough. 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 Presented [facility] on 02/27/2023 and was treated with vancomycin & cefepime. He then presented to [facility]. Chest CT at OSH was significant for bilateral patchy reticular opacities consistent with pneumonia. Found to have an elevated inflammatory markers (ESR 70, CRP 16). Negative RPP. Normal lactate. Reassuring stable on room air.  No acute findings on CXR. Considering HCAP, fungal vs PJP pneumonia, vs another abnormal granulomatous disease. Patient continues to refuse atovaquone and understands the risk associated with refusing the medication.   - ICID consulted appreciate recs  - levofloxacin 750 mg (1/5 - 1/9)   - atovaquone for PJP prophyalxis (has allergy to bactrim w/ rash, and pentamidine causeD bronchospasm, and dapsone caused cytopenias)   - induced sputum production for PJP PCR  - fungitell assay,galactomannan ordered  HIV (on ARVT) - Immunosuppression - Hx CMV, Syphilis, PJP  Diagnosed w/ HIV 10/2013. Follows with outpatient ID Dr. [name] in [place]. On Biktarvy. HIV undetected when checked on 10/27/2022. Viral load has been <20 since 2018. For CMV prophylaxis, patient chose to do preemptive monitoring and has never taken Valcyte. Last CMV PCR was detectable at <200 on 01/29/2023. For PJP prophylaxis, he previously has not tolerated Bactrim, pentamadine, or dapsone. Has been prescribed Atovaquone with strong recommendation for compliance with this medication, but was not taking it as of his last visit with his transplant nephrologist ([name]) on 02/02/2023. CD4 of 75 thought to be due thymoglobulin + immunosuppression. Reassuringly, HIV viral load <20. Per discussion with ID, will hold on further antifungal ppx.   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 PTH was elevated to 2182 on 12/14/2022. PTH from 03/02/23 of 1795. Received 1L of LR in the ED. States he is supposed to have surgery upcoming for possible hyperactive parathyroid gland (has already had 3 removed). Can consider granulomatous disease (consider PJP association), tertiary hyperparathyroidism, underlying worsening dehydration, malignancy. Considered cinacalet for him as well, but endorsed having an adverse reaction to it. Ionized Ca, Ca appears to be at patient's baseline, although elevated. Thus will hold off on additional IV fluids, but could consider resuming if begins to rise.   - daily BMP+Mg+Phos  - BID BMP, ionized calcium   - serum ACE, CA125, vitamin D levels, SPEP/IFE      HTN  Noted that Goal BP is <130/80. BP WNL although slightly elevated thus will restart home minoxidil 2.5mg  daily. Could consider increasing to BID given persistently elevated diastolic pressures.   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 Fistula thrombosis is s/p excision with Transplant Surgery in May 2024. With history can consider PE. No chest pain, however held on CTA with AKI. Hematology has been consulted in the past. PVL's with no acute clot, but left thigh showing signs of chronic obstruction.   - DVT Prophylaxis for now    Chronic Problems  Anemia of Chronic Kidney Disease : Baseline Hgb 12-13, 11.9 on admission. Monitoring with daily CBC. B12, folate w/n limits. Monitoring with Daily CBC.     Mild Intermittent Asthma - OSA: Uses albuterol inhaler as needed at home. Diagnosed with severe obstructive sleep apnea following 10/21/2019 sleep study. Does not use a CPAP at night. Stable on room air since admission. Continue albuterol PRN    Issues Impacting Complexity of Management:  -High risk of complications from pain and/or analgesia likely to result in delirium  -The patient is at high risk for the development of complications of volume overload due to the need to provide IV hydration for suspected hypovolemia in the setting of: stage IV or greater CKD  -The patient is at high risk of complications from hypercalcemia, infection in immunocompromised state    Medical Decision Making: Reviewed records from the following unique sources  EKG, echo, previous outpatient documentation, H&P.    Daily Checklist:  Diet: Regular Diet  DVT PPx: Heparin 5000units q8h  Electrolytes: Replete Potassium to >/= 3.6 and Magnesium to >/= 1.8  Code Status: Full Code  Dispo:  Continue plan of care on floor    Team Contact Information:   Primary Team: Nephrology (MEDB)  Primary Resident: Seleta Rhymes, MD  Resident's Pager: [phone] (Nephrology Intern - Tower)    Interval History:   No acute events overnight.    Patient with mild headache this AM. Otherwise doing well and looking forward to getting biopsy completed.     Objective:   Temp:  [36.3 ??C (97.3 ??F)-37 ??C (98.6 ??F)] 36.3 ??C (97.3 ??F)  Heart Rate:  [1-88] 1  Resp:  [14-18] 14  BP: (139-162)/(100-116) 162/116  SpO2:  [98 %-100 %] 100 %    Gen: NAD, converses appropriately  HENT: atraumatic, normocephalic currently.   Heart: RRR  Lungs: CTAB, no crackles or wheezes  Abdomen: soft, NTND  Extremities: No edema, fistula clotted but present in LUE, thrill felt from previous fistula of left lower forearm present     Seleta Rhymes, MD  Internal Medicine PGY 1

## 2023-03-06 NOTE — Unmapped (Signed)
Patient free from falls and injury this shift. VSS. RA. A&O x4. Independent. Headache managed with one time dose of oxy with good effect. Good urinary output. Sputum collected and sent this shift, results in process. Save left arm for AVF. Tele monitoring. Pt has been NPO since midnight for possible kidney biopsy. Call bell and bedside table within reach. Low bed. No other needs verbalized at this time. Care ongoing.       Problem: Adult Inpatient Plan of Care  Goal: Plan of Care Review  Outcome: Progressing  Goal: Patient-Specific Goal (Individualized)  Outcome: Progressing  Goal: Absence of Hospital-Acquired Illness or Injury  Outcome: Progressing  Intervention: Identify and Manage Fall Risk  Recent Flowsheet Documentation  Taken 03/05/2023 2000 by Freda Jackson I, RN  Safety Interventions:   aspiration precautions   commode/urinal/bedpan at bedside   fall reduction program maintained   lighting adjusted for tasks/safety   low bed   nonskid shoes/slippers when out of bed  Intervention: Prevent Infection  Recent Flowsheet Documentation  Taken 03/05/2023 2000 by Freda Jackson I, RN  Infection Prevention:   cohorting utilized   environmental surveillance performed   equipment surfaces disinfected   hand hygiene promoted   personal protective equipment utilized   rest/sleep promoted   visitors restricted/screened  Goal: Optimal Comfort and Wellbeing  Outcome: Progressing  Goal: Readiness for Transition of Care  Outcome: Progressing  Goal: Rounds/Family Conference  Outcome: Progressing     Problem: Infection  Goal: Absence of Infection Signs and Symptoms  Outcome: Progressing  Intervention: Prevent or Manage Infection  Recent Flowsheet Documentation  Taken 03/05/2023 2000 by Freda Jackson I, RN  Infection Management: aseptic technique maintained     Problem: HIV/AIDS Infection  Goal: HIV/AIDS Symptom Control  Outcome: Progressing  Intervention: Prevent Infection and Maximize Resistance  Recent Flowsheet Documentation  Taken 03/05/2023 2000 by Freda Jackson I, RN  Infection Prevention:   cohorting utilized   environmental surveillance performed   equipment surfaces disinfected   hand hygiene promoted   personal protective equipment utilized   rest/sleep promoted   visitors restricted/screened     Problem: Wound  Goal: Optimal Coping  Outcome: Progressing  Goal: Optimal Functional Ability  Outcome: Progressing  Intervention: Optimize Functional Ability  Recent Flowsheet Documentation  Taken 03/05/2023 2000 by Freda Jackson I, RN  Activity Management: up ad lib  Goal: Absence of Infection Signs and Symptoms  Outcome: Progressing  Intervention: Prevent or Manage Infection  Recent Flowsheet Documentation  Taken 03/05/2023 2000 by Freda Jackson I, RN  Infection Management: aseptic technique maintained  Goal: Improved Oral Intake  Outcome: Progressing  Goal: Optimal Pain Control and Function  Outcome: Progressing  Goal: Skin Health and Integrity  Outcome: Progressing  Intervention: Optimize Skin Protection  Recent Flowsheet Documentation  Taken 03/05/2023 2000 by Freda Jackson I, RN  Activity Management: up ad lib  Head of Bed (HOB) Positioning: HOB elevated  Goal: Optimal Wound Healing  Outcome: Progressing     Problem: Acute Kidney Injury/Impairment  Goal: Fluid and Electrolyte Balance  Outcome: Progressing  Goal: Improved Oral Intake  Outcome: Progressing  Goal: Effective Renal Function  Outcome: Progressing     Problem: Chronic Kidney Disease  Goal: Optimal Coping with Chronic Illness  Outcome: Progressing  Goal: Electrolyte Balance  Outcome: Progressing  Goal: Fluid Balance  Outcome: Progressing  Goal: Optimal Functional Ability  Outcome: Progressing  Intervention: Optimize Functional Ability  Recent Flowsheet Documentation  Taken 03/05/2023 2000 by Emilio Aspen, RN  Activity  Management: up ad lib  Goal: Absence of Anemia Signs and Symptoms  Outcome: Progressing  Intervention: Manage Signs of Anemia and Bleeding  Recent Flowsheet Documentation  Taken 03/05/2023 2000 by Freda Jackson I, RN  Bleeding Precautions: blood pressure closely monitored  Goal: Optimal Oral Intake  Outcome: Progressing  Goal: Acceptable Pain Control  Outcome: Progressing  Goal: Minimize Renal Failure Effects  Outcome: Progressing     Problem: Airway Clearance Ineffective  Goal: Effective Airway Clearance  Outcome: Progressing  Intervention: Promote Airway Secretion Clearance  Recent Flowsheet Documentation  Taken 03/05/2023 2000 by Freda Jackson I, RN  Activity Management: up ad lib     Problem: Pain Acute  Goal: Optimal Pain Control and Function  Outcome: Progressing

## 2023-03-06 NOTE — Unmapped (Signed)
[place] [facility] - General Biopsy Consultation Note      Requesting Attending Physician: Lenore Manner Denu-Ciocca, MD  Service Requesting Consult: Nephrology (MDB)    Date of Service: 03/06/2023  Consulting Interventional Radiologist: Dr. Benjamine Mola    Subjective:       Biopsy Site:  RLQ transplant kidney    HPI:  [name] is a 43 y.o. male with RLQ transplant kidney with c/f acute rejection.     Patient seen in consultation at the request of primary care team for consideration for non-targeted RLQ transplant kidney biopsy.       Objective:      Pertinent Imaging: Limited RLQ transplant [place] (03/06/2023)    Pertinent Laboratory Values:  WBC   Date Value Ref Range Status   03/06/2023 2.4 (L) 3.6 - 11.2 10*9/L Final   02/16/2023 2.1 (LL) 3.4 - 10.8 x10E3/uL Final     HGB   Date Value Ref Range Status   03/06/2023 11.2 (L) 12.9 - 16.5 g/dL Final   47/82/9562 13.0 (L) 13.0 - 17.7 g/dL Final     Hemoglobin, POC   Date Value Ref Range Status   07/20/2020 9.1 (L) 13.5 - 17.5 g/dL Final     HCT   Date Value Ref Range Status   03/06/2023 33.1 (L) 39.0 - 48.0 % Final   02/16/2023 35.5 (L) 37.5 - 51.0 % Final     Platelet   Date Value Ref Range Status   03/06/2023 187 150 - 450 10*9/L Final   02/16/2023 133 (L) 150 - 450 x10E3/uL Final     INR   Date Value Ref Range Status   03/06/2023 1.11  Final     Creatinine   Date Value Ref Range Status   03/06/2023 2.92 (H) 0.73 - 1.18 mg/dL Final   86/57/8469 6.29 (H) 0.76 - 1.27 mg/dL Final       Allergies:     Allergies   Allergen Reactions    Amlodipine Hives, Palpitations, Rash and Other (See Comments)     Causes fatigue      Doxercalciferol Hives    Fish Containing Products Hives and Shortness Of Breath     Please do not serve fish for meals. Highly allergic         Iron Sucrose Hives    Labetalol Itching and Rash    Metoclopramide Hives and Other (See Comments)     Causes ticks/ jerks    Metoprolol Hives    Nifedipine Hives, Itching and Shortness Of Breath    Other Swelling     Suture material, localized swelling at closure site    Sulfamethoxazole-Trimethoprim Rash and Other (See Comments)     Renal failure      Vancomycin Hives and Other (See Comments)     Patient states vancomycin caused kidney injury    Calcitonin     Peanut Oil Other (See Comments)    Potassium Clavulanate     Azathioprine Itching and Rash    Clindamycin Other (See Comments) and Palpitations     Chest pain    Hydralazine Itching and Dizziness           Sensipar [Cinacalcet] Hives       Physical Exam:    Vitals:    03/06/23 1050   BP: 162/116   Pulse: (!) 1   Resp: 14   Temp:    SpO2: 100%       General: No apparent distress.  Lungs: Breathing comfortably on  RA.  Heart:  Regular rate and rhythm.  Neuro: No obvious focal deficits.    ASA Grade: ASA 2 - Patient with mild systemic disease with no functional limitations  Airway assessment: Class 2 - Can visualize soft palate and fauces, tip of uvula is obscured    Assessment:     Christian Brown is a 43 y.o. male with RLQ transplant kidney and c/f acute rejection.  Pertinent history, imaging and laboratory values in patient's medical record have been reviewed.     Plan/Recommendations:         - VIR recommends proceeding with biopsy of RLQ transplant kidney with Ultrasound.  - Anticipated procedure date: 03/06/2023  - Anticoagulation: No  - Please keep NPO  - Please ensure recent CBC, Creatinine, and INR are available    Informed Consent:  This procedure and sedation has been fully reviewed with the patient/patient???s authorized representative. The risks, benefits and alternatives including but not limited to bleeding, infection, damage to adjacent structures have been explained, and the patient/patient???s authorized representative has consented to the procedure. Consent obtained by Konrad Dolores, MD, witnessed and scanned into patient's medical record.   --The patient will accept blood products in an emergent situation.  --The patient does not have a Do Not Resuscitate order in effect.    The patient was discussed with Dr. Benjamine Mola.       Konrad Dolores, MD, March 06, 2023, 11:56 AM

## 2023-03-06 NOTE — Unmapped (Signed)
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 No    Legal Next of Kin / Guardian / POA / Advance Directives    HCDM (patient stated preference): [name] - Mother - [phone]    Advance Directive (Medical Treatment)  Does patient have an advance directive covering medical treatment?: Patient does not have advance directive covering medical treatment.  Reason patient does not have an advance directive covering medical treatment:: Patient does not wish to complete one at this time.    Health Care Decision Maker [HCDM] (Medical & Mental Health Treatment)  Healthcare Decision Maker: Patient does not wish to appoint a Health Care Decision Maker at this time  Information offered on HCDM, Medical & Mental Health advance directives:: Patient declined information.    Advance Directive (Mental Health Treatment)  Does patient have an advance directive covering mental health treatment?: Patient does not have advance directive covering mental health treatment.  Reason patient does not have an advance directive covering mental health treatment:: Patient does not wish to complete one at this time.    Discharge Planning  Discharge Planning Information:   Type of Residence   Mailing Address:  [address]  [place] [address]    Medical Information   Past Medical History:   Diagnosis Date    Acquired immunodeficiency syndrome (CMS-HCC) 11/14/2013    Formatting of this note might be different from the original.   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   He appears to be doing well. States he is back to 90% except for his leg swelling.    Will change his epivir to 150 mg daily when he completes his liquid rx. Will see him back in 1 month and repeat his labs th    Acute deep vein thrombosis (DVT) of popliteal vein of left lower extremity (CMS-HCC) 02/05/2019    Last Assessment & Plan:   Formatting of this note might be different from the original.  Moderate effusion left knee new the some limitation of motion.  No evidence of sepsis the white count the normal  will     Proceed on with an aspiration for cell count, culture and crystal analysis.  New x-rays ordered and currently pending will follow.  Further therapeutic recommendations contingent on the re    Anemia     Asthma     Candidal esophagitis (CMS-HCC) 07/30/2022    Community acquired pneumonia 07/30/2014    DVT (deep venous thrombosis) (CMS-HCC) 11/24/2013    Last Assessment & Plan:    Formatting of this note might be different from the original.   Appears to be doing better (swelling improved). He will f/u with PCP regarding coumadin dosing (INR is low).  Formatting of this note might be different from the original.   Last Assessment & Plan:    Formatting of this note might be different from the original.   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   greatly appreciate renal f/u.  Formatting of this note might be different from the original.   Last Assessment & Plan:    greatly appreciate renal f/u.   Last Assessment & Plan:    greatly appreciate renal f/u.  Formatting of this note might be different from the original.   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    Rectal bleeding 04/23/2019    Sleep apnea        Past Surgical History:   Procedure Laterality Date    INTUSSUSCEPTION REPAIR      PERICARDIAL WINDOW      PR AUTOTRANSPLANT, PARATHYROID Bilateral 06/17/2019    Procedure: PARATHYROID AUTOTRANSPLANTATION;  Surgeon: Karren Burly, MD;  Location: MAIN OR [place];  Service: ENT    PR CREAT AV FISTULA,NON-AUTOGENOUS GRAFT Left 07/05/2022    Procedure: CREATE AV FISTULA (SEPARATE PROC); NONAUTOGENOUS GRAFT (EG, BIOLOGICAL COLLAGEN, THERMOPLASTIC GRAFT), UPPER EXTREMITY;  Surgeon: Toledo, Lilyan Punt, MD;  Location: MAIN OR [facility];  Service: Transplant    PR EXCISION TURBINATE,SUBMUCOUS Bilateral 10/22/2020    Procedure: SUBMUCOUS RESECTION INFERIOR TURBINATE, PARTIAL OR COMPLETE, ANY METHOD;  Surgeon: Maeola Sarah, MD;  Location: OR [facility];  Service: ENT    PR EXPLORE PARATHYROID GLANDS Bilateral 06/17/2019    Procedure: PARATHYROIDECTOMY OR EXPLORATION OF PARATHYROID(S);  Surgeon: Karren Burly, MD;  Location: MAIN OR [facility];  Service: ENT    PR REPAIR OF NASAL SEPTUM N/A 10/22/2020    Procedure: SEPTOPLASTY OR SUBMUCOUS RESECTION, WITH OR WITHOUT CARTILAGE SCORING, CONTOURING OR REPLACEMENT WITH GRAFT;  Surgeon: Maeola Sarah, MD;  Location: OR [facility];  Service: ENT    PR REVISE AV FISTULA,W/O THROMBECTOMY Left 06/06/2022    Procedure: REVISON, OPEN, ARTERIOVENOUS FISTULA; WITHOUT THROMBECTOMY, AUTOGENOUS OR NONAUTOGENOUS DIALYSIS GRAFT;  Surgeon: Toledo, Lilyan Punt, MD;  Location: MAIN OR [facility];  Service: Transplant    PR REVISE AV FISTULA,W/O THROMBECTOMY Left 07/05/2022    Procedure: REVISON, OPEN, ARTERIOVENOUS FISTULA; WITHOUT THROMBECTOMY, AUTOGENOUS OR NONAUTOGENOUS DIALYSIS GRAFT;  Surgeon: Toledo, Lilyan Punt, MD;  Location: MAIN OR [facility];  Service: Transplant    PR RIGHT HEART CATH O2 SATURATION & CARDIAC OUTPUT N/A 07/20/2020    Procedure: Right Heart Catheterization with PULMONARY ANGIOGRAM;  Surgeon: Marlaine Hind, MD;  Location: [facility] CATH;  Service: Cardiology    PR TRANSPLANT,PREP RENAL GRAFT/ARTERIAL N/A 09/05/2022    Procedure: [facility] RECONSTRUCTION CADAVER/LIVING DONOR RENAL ALLOGRAFT PRIOR TO TRANSPLANT; ARTERIAL ANASTOMOSIS EAC;  Surgeon: Toledo, Lilyan Punt, MD;  Location: MAIN OR [facility];  Service: Transplant    PR TRANSPLANTATION OF KIDNEY N/A 09/05/2022    Procedure: RENAL ALLOTRANSPLANTATION, IMPLANTATION OF GRAFT; WITHOUT RECIPIENT NEPHRECTOMY;  Surgeon: Toledo, Lilyan Punt, MD;  Location: MAIN OR [facility];  Service: Transplant  PR UPPER GI ENDOSCOPY,BIOPSY N/A 07/31/2022    Procedure: UGI ENDOSCOPY; WITH BIOPSY, SINGLE OR MULTIPLE;  Surgeon: Janace Aris, MD;  [facility];  Service: Gastroenterology    PR UPPER GI ENDOSCOPY,BIOPSY N/A 09/12/2022    Procedure: UGI ENDOSCOPY; WITH BIOPSY, SINGLE OR MULTIPLE;  Surgeon: Zetta Bills, MD;  Location: [facility];  Service: Gastroenterology    UMBILICAL HERNIA REPAIR         Family History   Problem Relation Age of Onset    Hypertension Mother     Arthritis Mother     Hypertension Father        Network engineer Insurance: Payor: MEDICARE / Plan: MEDICARE PART A AND PART B / Product Type: *No Product type* /    Secondary Insurance: Secondary Insurance  MEDICAID [place]   Prescription Coverage: Medicare D     Preferred Pharmacy: [facility] - [place], [place] - [address]  [facility]  [facility]  [facility]    Barriers to taking medication: No    Transition Home   Transportation at time of discharge: Family/Friend's Private Vehicle    Anticipated changes related to Illness:  TBD   Services in place prior to admission: N/A   Services anticipated for DC:  TBD   Hemodialysis Prior to Admission: No    Readmission  Risk of Unplanned Readmission Score: UNPLANNED READMISSION SCORE: 29.95%  Readmitted Within the Last 30 Days?   Readmission Factors include: other: w/in 1 year of txp    Social Determinants of Health  Social Determinants of Health were addressed in provider documentation.  Please refer to patient history.    Social History  Support Systems/Concerns: Counselling psychologist, Family Members            Hotel manager Service: No Field seismologist Affecting Healthcare: none    Medical and Psychiatric History  Psychosocial Stressors: Denies      Psychological Issues/Information: No issues              Chemical Dependency: None              Outpatient Providers: Specialist   Name / Contact #: : [place] [facility]  Legal: No legal issues      Ability to [employer]: No issues accessing community services

## 2023-03-07 LAB — CBC
HEMATOCRIT: 35.6 % — ABNORMAL LOW (ref 39.0–48.0)
HEMOGLOBIN: 12 g/dL — ABNORMAL LOW (ref 12.9–16.5)
MEAN CORPUSCULAR HEMOGLOBIN CONC: 33.7 g/dL (ref 32.0–36.0)
MEAN CORPUSCULAR HEMOGLOBIN: 35.3 pg — ABNORMAL HIGH (ref 25.9–32.4)
MEAN CORPUSCULAR VOLUME: 104.8 fL — ABNORMAL HIGH (ref 77.6–95.7)
MEAN PLATELET VOLUME: 8 fL (ref 6.8–10.7)
PLATELET COUNT: 194 10*9/L (ref 150–450)
RED BLOOD CELL COUNT: 3.4 10*12/L — ABNORMAL LOW (ref 4.26–5.60)
RED CELL DISTRIBUTION WIDTH: 14.4 % (ref 12.2–15.2)
WBC ADJUSTED: 3.5 10*9/L — ABNORMAL LOW (ref 3.6–11.2)

## 2023-03-07 LAB — IONIZED CALCIUM VENOUS: CALCIUM IONIZED VENOUS (MG/DL): 6.95 mg/dL (ref 4.40–5.40)

## 2023-03-07 LAB — BASIC METABOLIC PANEL
ANION GAP: 14 mmol/L (ref 5–14)
BLOOD UREA NITROGEN: 28 mg/dL — ABNORMAL HIGH (ref 9–23)
BUN / CREAT RATIO: 9
CALCIUM: 12.2 mg/dL (ref 8.7–10.4)
CHLORIDE: 108 mmol/L — ABNORMAL HIGH (ref 98–107)
CO2: 20 mmol/L (ref 20.0–31.0)
CREATININE: 3.06 mg/dL — ABNORMAL HIGH (ref 0.73–1.18)
EGFR CKD-EPI (2021) MALE: 25 mL/min/{1.73_m2} — ABNORMAL LOW (ref >=60)
GLUCOSE RANDOM: 129 mg/dL (ref 70–179)
POTASSIUM: 4.2 mmol/L (ref 3.4–4.8)
SODIUM: 142 mmol/L (ref 135–145)

## 2023-03-07 LAB — ANGIOTENSIN CONVERTING ENZYME: ANGIOTENSIN CONVERTING ENZYME: 37 U/L

## 2023-03-07 LAB — FUNGITELL ASSAY
FUNGITELL QUALITATIVE: NEGATIVE
FUNGITELL: <31 pg/mL

## 2023-03-07 LAB — TACROLIMUS LEVEL, TROUGH: TACROLIMUS, TROUGH: 23.5 ng/mL (ref 5.0–15.0)

## 2023-03-07 LAB — MAGNESIUM: MAGNESIUM: 1.7 mg/dL (ref 1.6–2.6)

## 2023-03-07 LAB — PHOSPHORUS: PHOSPHORUS: 3.6 mg/dL (ref 2.4–5.1)

## 2023-03-07 MED ADMIN — bictegrav-emtricit-tenofov ala (BIKTARVY) 50-200-25 mg tablet 1 tablet: 1 | ORAL | @ 14:00:00

## 2023-03-07 MED ADMIN — acetaminophen (TYLENOL) tablet 650 mg: 650 mg | ORAL | @ 01:00:00

## 2023-03-07 MED ADMIN — oxyCODONE (ROXICODONE) immediate release tablet 5 mg: 5 mg | ORAL | @ 14:00:00 | Stop: 2023-03-07

## 2023-03-07 MED ADMIN — minoxidil (LONITEN) tablet 2.5 mg: 2.5 mg | ORAL | @ 14:00:00

## 2023-03-07 MED ADMIN — carvedilol (COREG) tablet 25 mg: 25 mg | ORAL | @ 14:00:00

## 2023-03-07 MED ADMIN — tacrolimus (PROGRAF) 11 mg combo product: 11 mg | ORAL | @ 01:00:00

## 2023-03-07 MED ADMIN — oxyCODONE (ROXICODONE) immediate release tablet 5 mg: 5 mg | ORAL | @ 05:00:00 | Stop: 2023-03-07

## 2023-03-07 MED ADMIN — azathioprine (IMURAN) tablet 150 mg: 150 mg | ORAL | @ 01:00:00

## 2023-03-07 MED ADMIN — sodium chloride 0.9% (NS) bolus 1,000 mL: 1000 mL | INTRAVENOUS | @ 17:00:00 | Stop: 2023-03-07

## 2023-03-07 MED ADMIN — tacrolimus (PROGRAF) 11 mg combo product: 11 mg | ORAL | @ 14:00:00

## 2023-03-07 MED ADMIN — carvedilol (COREG) tablet 25 mg: 25 mg | ORAL | @ 01:00:00

## 2023-03-07 MED ADMIN — predniSONE (DELTASONE) tablet 5 mg: 5 mg | ORAL | @ 14:00:00

## 2023-03-07 MED ADMIN — pantoprazole (Protonix) EC tablet 40 mg: 40 mg | ORAL | @ 01:00:00

## 2023-03-07 MED ADMIN — pantoprazole (Protonix) EC tablet 40 mg: 40 mg | ORAL | @ 14:00:00

## 2023-03-07 MED ADMIN — HYDROmorphone (PF) (DILAUDID) injection Syrg 0.5 mg: .5 mg | INTRAVENOUS | @ 10:00:00 | Stop: 2023-03-07

## 2023-03-07 NOTE — Unmapped (Signed)
Tacrolimus Therapeutic Monitoring Pharmacy Note    Oluwademilade Riccitelli is a 43 y.o. male continuing tacrolimus.     Indication: Kidney transplant     Date of Transplant:  09/05/22       Prior Dosing Information: Home regimen tacrolimus 10 mg BID, currently on 11 mg BID       Source(s) of information used to determine prior to admission dosing: Clinic Note    Goals:  Therapeutic Drug Levels  Tacrolimus trough goal:  6-8 ng/ml    Additional Clinical Monitoring/Outcomes  Monitor renal function (SCr and urine output) and liver function (LFTs)  Monitor for signs/symptoms of adverse events (e.g., hyperglycemia, hyperkalemia, hypomagnesemia, hypertension, headache, tremor)    Results:   Tacrolimus level:  23.5 ng/mL, drawn ~1 hour after dose given, inaccurate    Pharmacokinetic Considerations and Significant Drug Interactions:  Concurrent hepatotoxic medications: None identified  Concurrent CYP3A4 substrates/inhibitors: None identified  Concurrent nephrotoxic medications: None identified    Assessment/Plan:  Recommendedation(s)  Continue current regimen of tacrolimus 11 mg BID    Follow-up  Daily levels with AM labs .   A pharmacist will continue to monitor and recommend levels as appropriate    Please page service pharmacist with questions/clarifications.    [place] M Jevonte Clanton, PharmD

## 2023-03-07 NOTE — Unmapped (Signed)
Nephrology ([facility]) Progress Note    Assessment & Plan:   Christian Brown is a 43 y.o. 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 Cr has continued to rise since 1/3. HLA DSA reassuringly negative. However with unknown cause for AKI, a renal biopsy of transplant kidney was completed on 1/7. Patient with constant abdominal pain at site. Reassuringly patient with stable vital signs & Hb as well as lack of hematuria, however will get Renal Transplant [place] for further evaluation. Pending Renal Transplant pathology.   - avoid nephrotoxic agents   - daily RFP  - SPEP/IFE pending  - Renal Transplant surgical pathology pending   - continue home immunosuppression with tacrolimus 10mg  PO BID, azathioprine 105mg  PO daily, and prednisone 5mg  PO daily  (initially on MMF, but stopped 2/2 pill esophagitis/gastitis)    C/f Multifocal Pneumonia - Hx of PJP Pneumonia - Productive Cough (resolved)   One month of shortness of breath along with productive cough. 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 Completed therapy for HCAP with levofloxacin. Patient continues to refuse atovaquone and understands the risk associated with refusing the medication. Reassuringly without productive cough.   - ICID consulted appreciate recs  - atovaquone for PJP prophyalxis (has allergy to bactrim w/ rash, and pentamidine causeD bronchospasm, and dapsone caused cytopenias)   - induced sputum production for PJP PCR  - fungitell assay,galactomannan ordered    Hypercalcemia - Secondary vs Tertiary Renal Hyperparathyroidism  Patient with elevated PTH, s/p 3 parathyroid glands removed. Planning for upcoming parathyroid gland removal on 1/15 for possible hyperactive parathyroid gland. Patient with persistent, but relatively stable serum Ca & ionized Ca. This is likely related to patient's known parathyroid disease. Considered cinacalet for him as well, but endorsed having an adverse reaction to it. Ionized Ca 6.97 on 1/8 and thus will give 1L NS.   - daily BMP+Mg+Phos + ionized Ca  - Pending serum ACE, vitamin D levels, SPEP/IFE      HTN  Noted that Goal BP is <130/80. BP WNL although slightly elevated thus resumed home minoxidil 2.5mg  daily. Could consider increasing to BID given persistently elevated diastolic pressures.   - Carvidilol 25mg  BID  - Minoxidil  2.5mg  daily      Chronic Problems  Anemia of Chronic Kidney Disease : Baseline Hgb 12-13, 11.9 on admission. Monitoring with daily CBC. B12, folate w/n limits. Monitoring with Daily CBC.   HIV: CD4 of 75 thought to be due thymoglobulin + immunosuppression. Reassuringly, HIV viral load <20. Continue home Biktarvy 1 tablet PO daily     Mild Intermittent Asthma - OSA: Uses albuterol inhaler as needed at home. Diagnosed with severe obstructive sleep apnea following 10/21/2019 sleep study. Does not use a CPAP at night. Stable on room air since admission. Continue albuterol PRN    Issues Impacting Complexity of Management:  -High risk of complications from pain and/or analgesia likely to result in delirium  -The patient is at high risk for the development of complications of volume overload due to the need to provide IV hydration for suspected hypovolemia in the setting of: stage IV or greater CKD  -The patient is at high risk of complications from hypercalcemia, infection in immunocompromised state    Daily Checklist:  Diet: Regular Diet  DVT PPx: Heparin 5000units q8h  Electrolytes: Replete Potassium to >/= 3.6 and Magnesium to >/= 1.8  Code Status: Full Code  Dispo:  Continue plan of care on floor    Team Contact Information:   Primary Team: Nephrology (MEDB)  Primary Resident: Seleta Rhymes, MD  Resident's Pager: [phone] (Nephrology Intern - Tower)    Interval History:   Overnight, the patient had RLQ abdominal pain where renal biopsy was done. Overnight resident evaluated area and did not appreciate any significant findings. VSS and pt given pain medication.    Patient with abdominal pain in RLQ over site of renal transplant. Pain does not radiate and feels like its just related to his skin. Denies pain with urination or hematuria.     Objective:   Temp:  [36.5 ??C (97.7 ??F)-36.7 ??C (98.1 ??F)] 36.7 ??C (98.1 ??F)  Heart Rate:  [77-106] 82  Resp:  [15-21] 18  BP: (124-164)/(78-106) 124/89  SpO2:  [97 %-100 %] 99 %    Gen: NAD, converses appropriately  HENT: atraumatic, normocephalic.   Heart: RRR, no murmurs.   Lungs: CTAB, no crackles or wheezes  Abdomen: soft, NTND  Extremities: No edema, fistula clotted but present in LUE, thrill felt from previous fistula of left lower forearm present     Seleta Rhymes, MD  Internal Medicine PGY 1

## 2023-03-07 NOTE — Unmapped (Signed)
Patient has headache all day ( not new), bp is stable. Tylenol given 2x as ordered with some relief. US guided renal biopsy was not done, went to [facility] instead. Free from any injury this shift, call bell within reach.    Problem: Adult Inpatient Plan of Care  Goal: Plan of Care Review  03/06/2023 1739 by Valentino Nose, RN  Outcome: Not Progressing  03/06/2023 1654 by Valentino Nose, RN  Outcome: Ongoing - Unchanged  Goal: Patient-Specific Goal (Individualized)  03/06/2023 1739 by Valentino Nose, RN  Outcome: Not Progressing  03/06/2023 1654 by Valentino Nose, RN  Outcome: Ongoing - Unchanged  Goal: Absence of Hospital-Acquired Illness or Injury  03/06/2023 1739 by Valentino Nose, RN  Outcome: Not Progressing  03/06/2023 1654 by Valentino Nose, RN  Outcome: Ongoing - Unchanged  Intervention: Identify and Manage Fall Risk  Recent Flowsheet Documentation  Taken 03/06/2023 0746 by Valentino Nose, RN  Safety Interventions:   fall reduction program maintained   low bed   no IV/BP/blood draw left arm   nonskid shoes/slippers when out of bed  Goal: Optimal Comfort and Wellbeing  03/06/2023 1739 by Valentino Nose, RN  Outcome: Not Progressing  03/06/2023 1654 by Valentino Nose, RN  Outcome: Ongoing - Unchanged  Goal: Readiness for Transition of Care  03/06/2023 1739 by Valentino Nose, RN  Outcome: Not Progressing  03/06/2023 1654 by Valentino Nose, RN  Outcome: Ongoing - Unchanged  Goal: Rounds/Family Conference  03/06/2023 1739 by Valentino Nose, RN  Outcome: Not Progressing  03/06/2023 1654 by Valentino Nose, RN  Outcome: Ongoing - Unchanged

## 2023-03-07 NOTE — Unmapped (Cosign Needed)
[facility] Post-Procedure Note    Procedure Name:  Non-targeted liver biopsy    Pre-Op Diagnosis: RLQ transplant - concern for acute rejection.     Post-Op Diagnosis: Same as pre-operative diagnosis    [facility] Providers    Attending: Dr. Benjamine Mola  Fellow/Resident: Dr. Kendrick Ranch    Time out: Prior to the procedure, a time out was performed with all team members present. During the time out, the patient, procedure and procedure site when applicable were verbally verified by the team members and Dr. Kendrick Ranch and Dr. Benjamine Mola.     Description of procedure: Successful non-targeted biopsy of patient's RLQ transplant kidney. Gel foam tract embolization.     Sedation: Moderate    Estimated Blood Loss: Minimal  Specimens:  3 cores   Complications: None    Plan:  Follow up pathology analysis.     See detailed procedure note with images in PACS.    The patient tolerated the procedure well without incident or complication and was transported from [facility] in stable condition.    Kendrick Ranch, MD  [facility], PGY-5  03/06/2023 5:10 PM

## 2023-03-07 NOTE — Unmapped (Signed)
Pt admitted with SOB and cough. Pt is alert and orientated x4. VSS. Pt has right lower abdominal biopsy site which has an island dressing on it and no signs of bleeding. Its D & I. Pt has been complaining of pain in RLQ and pt given oxycodone prn with minimal effect. Hydromorphone one time order was given and its effective. Pt can independently walk. No other issues at this time  Problem: Adult Inpatient Plan of Care  Goal: Plan of Care Review  Outcome: Progressing  Flowsheets (Taken 03/07/2023 0617)  Progress: improving  Goal: Patient-Specific Goal (Individualized)  Outcome: Progressing  Goal: Absence of Hospital-Acquired Illness or Injury  Outcome: Progressing  Goal: Optimal Comfort and Wellbeing  Outcome: Progressing  Goal: Readiness for Transition of Care  Outcome: Progressing  Goal: Rounds/Family Conference  Outcome: Progressing     Problem: Infection  Goal: Absence of Infection Signs and Symptoms  Outcome: Progressing

## 2023-03-08 LAB — CBC
HEMATOCRIT: 33.9 % — ABNORMAL LOW (ref 39.0–48.0)
HEMOGLOBIN: 11.6 g/dL — ABNORMAL LOW (ref 12.9–16.5)
MEAN CORPUSCULAR HEMOGLOBIN CONC: 34.3 g/dL (ref 32.0–36.0)
MEAN CORPUSCULAR HEMOGLOBIN: 36.1 pg — ABNORMAL HIGH (ref 25.9–32.4)
MEAN CORPUSCULAR VOLUME: 105 fL — ABNORMAL HIGH (ref 77.6–95.7)
MEAN PLATELET VOLUME: 8.5 fL (ref 6.8–10.7)
PLATELET COUNT: 170 10*9/L (ref 150–450)
RED BLOOD CELL COUNT: 3.22 10*12/L — ABNORMAL LOW (ref 4.26–5.60)
RED CELL DISTRIBUTION WIDTH: 14.6 % (ref 12.2–15.2)
WBC ADJUSTED: 2.1 10*9/L — ABNORMAL LOW (ref 3.6–11.2)

## 2023-03-08 LAB — BASIC METABOLIC PANEL
ANION GAP: 12 mmol/L (ref 5–14)
BLOOD UREA NITROGEN: 23 mg/dL (ref 9–23)
BUN / CREAT RATIO: 8
CALCIUM: 12 mg/dL — ABNORMAL HIGH (ref 8.7–10.4)
CHLORIDE: 107 mmol/L (ref 98–107)
CO2: 24 mmol/L (ref 20.0–31.0)
CREATININE: 2.78 mg/dL — ABNORMAL HIGH (ref 0.73–1.18)
EGFR CKD-EPI (2021) MALE: 29 mL/min/{1.73_m2} — ABNORMAL LOW (ref >=60)
GLUCOSE RANDOM: 156 mg/dL (ref 70–179)
POTASSIUM: 3.5 mmol/L (ref 3.4–4.8)
SODIUM: 143 mmol/L (ref 135–145)

## 2023-03-08 LAB — HIGH SENSITIVITY TROPONIN I - SINGLE
HIGH SENSITIVITY TROPONIN I: 182 ng/L (ref <=53)
HIGH SENSITIVITY TROPONIN I: 164 ng/L (ref <=53)

## 2023-03-08 LAB — MAGNESIUM: MAGNESIUM: 1.3 mg/dL — ABNORMAL LOW (ref 1.6–2.6)

## 2023-03-08 LAB — IONIZED CALCIUM VENOUS: CALCIUM IONIZED VENOUS (MG/DL): 6.93 mg/dL (ref 4.40–5.40)

## 2023-03-08 LAB — PHOSPHORUS: PHOSPHORUS: 2.8 mg/dL (ref 2.4–5.1)

## 2023-03-08 LAB — TACROLIMUS LEVEL, TROUGH: TACROLIMUS, TROUGH: 21.8 ng/mL (ref 5.0–15.0)

## 2023-03-08 MED ADMIN — minoxidil (LONITEN) tablet 2.5 mg: 2.5 mg | ORAL | @ 13:00:00

## 2023-03-08 MED ADMIN — pantoprazole (Protonix) EC tablet 40 mg: 40 mg | ORAL | @ 02:00:00

## 2023-03-08 MED ADMIN — sodium chloride (NS) 0.9 % infusion: 125 mL/h | INTRAVENOUS | @ 17:00:00 | Stop: 2023-03-09

## 2023-03-08 MED ADMIN — tacrolimus (PROGRAF) 11 mg combo product: 11 mg | ORAL | @ 02:00:00

## 2023-03-08 MED ADMIN — oxyCODONE (ROXICODONE) immediate release tablet 5 mg: 5 mg | ORAL | @ 02:00:00 | Stop: 2023-03-07

## 2023-03-08 MED ADMIN — carvedilol (COREG) tablet 25 mg: 25 mg | ORAL | @ 13:00:00

## 2023-03-08 MED ADMIN — carvedilol (COREG) tablet 25 mg: 25 mg | ORAL | @ 02:00:00

## 2023-03-08 MED ADMIN — pantoprazole (Protonix) EC tablet 40 mg: 40 mg | ORAL | @ 13:00:00

## 2023-03-08 MED ADMIN — bictegrav-emtricit-tenofov ala (BIKTARVY) 50-200-25 mg tablet 1 tablet: 1 | ORAL | @ 13:00:00

## 2023-03-08 MED ADMIN — predniSONE (DELTASONE) tablet 5 mg: 5 mg | ORAL | @ 13:00:00

## 2023-03-08 MED ADMIN — tacrolimus (PROGRAF) 11 mg combo product: 11 mg | ORAL | @ 13:00:00

## 2023-03-08 MED ADMIN — azathioprine (IMURAN) tablet 150 mg: 150 mg | ORAL | @ 02:00:00

## 2023-03-08 NOTE — Unmapped (Signed)
Patient is alert and oriented and able to make his needs known. Patient CO dizzy and chest discomfort this morning, checked his vital sign which was ok but patient reporting that BP is low for him to cause these symptom, MD notified, patient refused to obtain EKG, Troponin was drawn which was elevated, later another Troponin drawn its trending down, Patient started feeling better after few hours. IV fluid infused @125ml /hr but paused per patient request,  restarted again after few hours. Self ambulatory. TELE monitoring in place. Call bell within reach.      Problem: Adult Inpatient Plan of Care  Goal: Plan of Care Review  Outcome: Progressing  Flowsheets (Taken 03/08/2023 1554)  Progress: improving  Plan of Care Reviewed With: patient  Goal: Patient-Specific Goal (Individualized)  Outcome: Progressing  Goal: Absence of Hospital-Acquired Illness or Injury  Outcome: Progressing  Intervention: Identify and Manage Fall Risk  Recent Flowsheet Documentation  Taken 03/08/2023 0747 by Milinda Pointer, RN  Safety Interventions:   fall reduction program maintained   lighting adjusted for tasks/safety   low bed   nonskid shoes/slippers when out of bed  Intervention: Prevent Skin Injury  Recent Flowsheet Documentation  Taken 03/08/2023 0747 by Milinda Pointer, RN  Positioning for Skin: Supine/Back  Intervention: Prevent and Manage VTE (Venous Thromboembolism) Risk  Recent Flowsheet Documentation  Taken 03/08/2023 0900 by Milinda Pointer, RN  VTE Prevention/Management:   anticoagulant therapy   ambulation promoted  Intervention: Prevent Infection  Recent Flowsheet Documentation  Taken 03/08/2023 0747 by Milinda Pointer, RN  Infection Prevention:   cohorting utilized   environmental surveillance performed   hand hygiene promoted   single patient room provided  Goal: Optimal Comfort and Wellbeing  Outcome: Progressing  Goal: Readiness for Transition of Care  Outcome: Progressing  Goal: Rounds/Family Conference  Outcome: Progressing     Problem: Infection  Goal: Absence of Infection Signs and Symptoms  Outcome: Progressing     Problem: HIV/AIDS Infection  Goal: HIV/AIDS Symptom Control  Outcome: Progressing  Intervention: Prevent Infection and Maximize Resistance  Recent Flowsheet Documentation  Taken 03/08/2023 0747 by Milinda Pointer, RN  Infection Prevention:   cohorting utilized   environmental surveillance performed   hand hygiene promoted   single patient room provided     Problem: Wound  Goal: Optimal Coping  Outcome: Progressing  Goal: Optimal Functional Ability  Outcome: Progressing  Intervention: Optimize Functional Ability  Recent Flowsheet Documentation  Taken 03/08/2023 0747 by Milinda Pointer, RN  Activity Management: back to bed  Goal: Absence of Infection Signs and Symptoms  Outcome: Progressing  Goal: Improved Oral Intake  Outcome: Progressing  Goal: Optimal Pain Control and Function  Outcome: Progressing  Goal: Skin Health and Integrity  Outcome: Progressing  Intervention: Optimize Skin Protection  Recent Flowsheet Documentation  Taken 03/08/2023 0747 by Milinda Pointer, RN  Activity Management: back to bed  Pressure Reduction Techniques:   frequent weight shift encouraged   heels elevated off bed  Head of Bed (HOB) Positioning: HOB elevated  Pressure Reduction Devices: pressure-redistributing mattress utilized  Goal: Optimal Wound Healing  Outcome: Progressing     Problem: Acute Kidney Injury/Impairment  Goal: Fluid and Electrolyte Balance  Outcome: Progressing  Goal: Improved Oral Intake  Outcome: Progressing  Goal: Effective Renal Function  Outcome: Progressing     Problem: Chronic Kidney Disease  Goal: Optimal Coping with Chronic Illness  Outcome: Progressing  Goal: Electrolyte Balance  Outcome: Progressing  Goal: Fluid Balance  Outcome:  Progressing  Goal: Optimal Functional Ability  Outcome: Progressing  Intervention: Optimize Functional Ability  Recent Flowsheet Documentation  Taken 03/08/2023 0747 by Milinda Pointer, RN  Activity Management: back to bed  Goal: Absence of Anemia Signs and Symptoms  Outcome: Progressing  Goal: Optimal Oral Intake  Outcome: Progressing  Goal: Acceptable Pain Control  Outcome: Progressing  Goal: Minimize Renal Failure Effects  Outcome: Progressing     Problem: Airway Clearance Ineffective  Goal: Effective Airway Clearance  Outcome: Progressing  Intervention: Promote Airway Secretion Clearance  Recent Flowsheet Documentation  Taken 03/08/2023 0747 by Milinda Pointer, RN  Activity Management: back to bed     Problem: Pain Acute  Goal: Optimal Pain Control and Function  Outcome: Progressing

## 2023-03-08 NOTE — Unmapped (Signed)
Patient is alert and oriented and able to make his needs known. Self ambulatory to bathroom. 1L NS infused this shift. US renal done today in his biopsy site. CO pain so PRN Oxy given with some relief. Waiting for renal biopsy results. Call bell within reach.      Problem: Adult Inpatient Plan of Care  Goal: Plan of Care Review  Outcome: Progressing  Flowsheets (Taken 03/07/2023 1613)  Progress: improving  Plan of Care Reviewed With: patient  Goal: Patient-Specific Goal (Individualized)  Outcome: Progressing  Goal: Absence of Hospital-Acquired Illness or Injury  Outcome: Progressing  Intervention: Identify and Manage Fall Risk  Recent Flowsheet Documentation  Taken 03/07/2023 0745 by Milinda Pointer, RN  Safety Interventions:   fall reduction program maintained   environmental modification   lighting adjusted for tasks/safety   low bed  Intervention: Prevent Skin Injury  Recent Flowsheet Documentation  Taken 03/07/2023 0900 by Milinda Pointer, RN  Positioning for Skin: Left  Taken 03/07/2023 0745 by Milinda Pointer, RN  Positioning for Skin: Supine/Back  Intervention: Prevent and Manage VTE (Venous Thromboembolism) Risk  Recent Flowsheet Documentation  Taken 03/07/2023 0900 by Milinda Pointer, RN  VTE Prevention/Management:   ambulation promoted   anticoagulant therapy  Intervention: Prevent Infection  Recent Flowsheet Documentation  Taken 03/07/2023 0745 by Milinda Pointer, RN  Infection Prevention:   cohorting utilized   environmental surveillance performed  Goal: Optimal Comfort and Wellbeing  Outcome: Progressing  Goal: Readiness for Transition of Care  Outcome: Progressing  Goal: Rounds/Family Conference  Outcome: Progressing     Problem: Infection  Goal: Absence of Infection Signs and Symptoms  Outcome: Progressing  Intervention: Prevent or Manage Infection  Recent Flowsheet Documentation  Taken 03/07/2023 0745 by Milinda Pointer, RN  Infection Management: aseptic technique maintained     Problem: HIV/AIDS Infection  Goal: HIV/AIDS Symptom Control  Outcome: Progressing  Intervention: Prevent Infection and Maximize Resistance  Recent Flowsheet Documentation  Taken 03/07/2023 0745 by Milinda Pointer, RN  Infection Prevention:   cohorting utilized   environmental surveillance performed     Problem: Wound  Goal: Optimal Coping  Outcome: Progressing  Goal: Optimal Functional Ability  Outcome: Progressing  Intervention: Optimize Functional Ability  Recent Flowsheet Documentation  Taken 03/07/2023 0745 by Milinda Pointer, RN  Activity Management:   back to bed   up ad lib  Goal: Absence of Infection Signs and Symptoms  Outcome: Progressing  Intervention: Prevent or Manage Infection  Recent Flowsheet Documentation  Taken 03/07/2023 0745 by Milinda Pointer, RN  Infection Management: aseptic technique maintained  Goal: Improved Oral Intake  Outcome: Progressing  Goal: Optimal Pain Control and Function  Outcome: Progressing  Goal: Skin Health and Integrity  Outcome: Progressing  Intervention: Optimize Skin Protection  Recent Flowsheet Documentation  Taken 03/07/2023 0745 by Milinda Pointer, RN  Activity Management:   back to bed   up ad lib  Pressure Reduction Techniques:   frequent weight shift encouraged   heels elevated off bed  Pressure Reduction Devices: pressure-redistributing mattress utilized  Goal: Optimal Wound Healing  Outcome: Progressing     Problem: Acute Kidney Injury/Impairment  Goal: Fluid and Electrolyte Balance  Outcome: Progressing  Goal: Improved Oral Intake  Outcome: Progressing  Goal: Effective Renal Function  Outcome: Progressing     Problem: Chronic Kidney Disease  Goal: Optimal Coping with Chronic Illness  Outcome: Progressing  Goal: Electrolyte Balance  Outcome: Progressing  Goal: Fluid Balance  Outcome: Progressing  Goal: Optimal Functional Ability  Outcome: Progressing  Intervention: Optimize Functional Ability  Recent Flowsheet Documentation  Taken 03/07/2023 0745 by Milinda Pointer, RN  Activity Management:   back to bed   up ad lib  Goal: Absence of Anemia Signs and Symptoms  Outcome: Progressing  Goal: Optimal Oral Intake  Outcome: Progressing  Goal: Acceptable Pain Control  Outcome: Progressing  Goal: Minimize Renal Failure Effects  Outcome: Progressing     Problem: Airway Clearance Ineffective  Goal: Effective Airway Clearance  Outcome: Progressing  Intervention: Promote Airway Secretion Clearance  Recent Flowsheet Documentation  Taken 03/07/2023 0745 by Milinda Pointer, RN  Activity Management:   back to bed   up ad lib     Problem: Pain Acute  Goal: Optimal Pain Control and Function  Outcome: Progressing

## 2023-03-08 NOTE — Unmapped (Signed)
Tacrolimus Therapeutic Monitoring Pharmacy Note    Christian Brown is a 43 y.o. male continuing tacrolimus.     Indication: Kidney transplant     Date of Transplant:  09/05/22       Prior Dosing Information: Home regimen tacrolimus 10 mg BID, currently on 11 mg BID       Source(s) of information used to determine prior to admission dosing: Clinic Note    Goals:  Therapeutic Drug Levels  Tacrolimus trough goal:  6-8 ng/ml    Additional Clinical Monitoring/Outcomes  Monitor renal function (SCr and urine output) and liver function (LFTs)  Monitor for signs/symptoms of adverse events (e.g., hyperglycemia, hyperkalemia, hypomagnesemia, hypertension, headache, tremor)    Results:   Tacrolimus level:  21.8 ng/mL, drawn ~1 hour after dose given, inaccurate    Pharmacokinetic Considerations and Significant Drug Interactions:  Concurrent hepatotoxic medications: None identified  Concurrent CYP3A4 substrates/inhibitors: None identified  Concurrent nephrotoxic medications: None identified    Assessment/Plan:  Recommendedation(s)  Continue current regimen of tacrolimus 11 mg BID    Follow-up  Daily levels with AM labs .   A pharmacist will continue to monitor and recommend levels as appropriate    Please page service pharmacist with questions/clarifications.    Lurline Del, PharmD

## 2023-03-08 NOTE — Unmapped (Signed)
Nephrology ([facility]) Progress Note    Assessment & Plan:   Christian Brown is a 43 y.o. 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 Cr has continued to rise since 1/3. HLA DSA reassuringly negative. However with unknown cause for AKI, a renal biopsy of transplant kidney was completed on 1/7. Patient with constant abdominal pain at site on 1/8 with stable vital signs and Hb. Renal US without evidence of hematoma.  Abdominal pain this AM resolving. Pending Renal Transplant pathology.   - avoid nephrotoxic agents   - daily RFP  - Renal Transplant surgical pathology pending   - continue home immunosuppression with tacrolimus 10mg  PO BID, azathioprine 105mg  PO daily, and prednisone 5mg  PO daily  (initially on MMF, but stopped 2/2 pill esophagitis/gastitis)    Chest Pain - C/f Demand Ischemia   Patient complaining of constant chest pain on left side of chest that does not radiate to jaw/left arm. States it worsens when he lays down. Previously occurred when patient hypotensive. BP 111/65 at time of evaluation. Discussed obtaining EKG, however patient refused. He stated he understood that we could be missing potietnial cardiac dysfunction, including STEMI, if he refused the EKG. Troponin 182 - 164 and thus likely consistent with demand ischemia. Patient's pain resolved without additional intervention.   - CTM    C/f Multifocal Pneumonia - Hx of PJP Pneumonia - Productive Cough (resolved)   One month of shortness of breath along with productive cough. 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 Completed therapy for HCAP with levofloxacin. Patient continues to refuse atovaquone and understands the risk associated with refusing the medication. Reassuringly without productive cough. Reassuringly, PJP PCR negative on 1/9.   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 Planning for upcoming parathyroid gland removal on 1/15 for possible hyperactive parathyroid gland. Patient with persistent, but relatively stable serum Ca & ionized Ca. This is likely related to patient's known parathyroid disease. Considered  cinacalet for him as well, but endorsed having an adverse reaction to it. Ionized Ca 6.93 on 1/9, stable from 1/8 despite 1L of NS, thus will begin IV NS 125 mL/hr.   - START IV NS 125 mL/hr   - daily BMP+Mg+Phos + ionized Ca  - Pending serum ACE, vitamin D levels      HTN  Noted that Goal BP is <130/80. BP WNL although slightly elevated thus resumed home minoxidil 2.5mg  daily. Could consider increasing to BID given persistently elevated diastolic pressures.   - Carvidilol 25mg  BID  - Minoxidil 2.5mg  daily      Chronic Problems  Anemia of Chronic Kidney Disease : Baseline Hgb 12-13, 11.9 on admission. Monitoring with daily CBC. B12, folate w/n limits. Monitoring with Daily CBC.   HIV: CD4 of 75 thought to be due thymoglobulin + immunosuppression. Reassuringly, HIV viral load <20. Continue home Biktarvy 1 tablet PO daily     Mild Intermittent Asthma - OSA: Uses albuterol inhaler as needed at home. Diagnosed with severe obstructive sleep apnea following 10/21/2019 sleep study. Does not use a CPAP at night. Stable on room air since admission. Continue albuterol PRN    Issues Impacting Complexity of Management:  -High risk of complications from pain and/or analgesia likely to result in delirium  -The patient is at high risk for the development of complications of volume overload due to the need to provide IV hydration for suspected hypovolemia in the setting of: stage IV or greater CKD  -The patient is at high risk of complications from hypercalcemia, infection in immunocompromised state    Daily Checklist:  Diet: Regular Diet  DVT PPx: Heparin 5000units q8h  Electrolytes: Replete Potassium to >/= 3.6 and Magnesium to >/= 1.8  Code Status: Full Code  Dispo:  Continue plan of care on floor    Team Contact Information:   Primary Team: Nephrology ([facility])  Primary Resident: Seleta Rhymes, MD  Resident's Pager: [phone] (Nephrology Intern - Tower)    Interval History:   No acute events overnight.    Patient overall feeling okay this AM. Sitting up on side of bed. No acute complaints or concerns. Looking forward to getting results of biopsy.     Objective:   Temp:  [36.5 ??C (97.7 ??F)-36.6 ??C (97.9 ??F)] 36.5 ??C (97.7 ??F)  Heart Rate:  [80-96] 96  Resp:  [16-18] 16  BP: (111-139)/(65-88) 117/72  SpO2:  [98 %-99 %] 99 %    Gen: NAD, converses appropriately  HENT: atraumatic, normocephalic.   Heart: RRR, no murmurs.   Lungs: CTAB, no crackles or wheezes  Abdomen: soft, NTND  Extremities: No edema, fistula clotted but present in LUE, thrill felt from previous fistula of left lower forearm present     Seleta Rhymes, MD  Internal Medicine PGY 1

## 2023-03-08 NOTE — Unmapped (Signed)
Pt has been alert and oriented throughout the shift. VSS. C/o biopsy site pain; treated with PRN oxycodone with good effect. 1800 ml urine output for the shift. Bed locked in the lowest position with two side rails up. Pt is free from fall/injury.  Problem: Adult Inpatient Plan of Care  Goal: Plan of Care Review  Outcome: Progressing  Goal: Patient-Specific Goal (Individualized)  Outcome: Progressing  Goal: Absence of Hospital-Acquired Illness or Injury  Outcome: Progressing  Intervention: Identify and Manage Fall Risk  Recent Flowsheet Documentation  Taken 03/07/2023 2000 by Leisa Lenz, RN  Safety Interventions:   low bed   fall reduction program maintained  Intervention: Prevent Infection  Recent Flowsheet Documentation  Taken 03/07/2023 2000 by Leisa Lenz, RN  Infection Prevention: hand hygiene promoted  Goal: Optimal Comfort and Wellbeing  Outcome: Progressing  Goal: Readiness for Transition of Care  Outcome: Progressing  Goal: Rounds/Family Conference  Outcome: Progressing     Problem: HIV/AIDS Infection  Goal: HIV/AIDS Symptom Control  Outcome: Progressing  Intervention: Prevent Infection and Maximize Resistance  Recent Flowsheet Documentation  Taken 03/07/2023 2000 by Alan Mulder A, RN  Infection Prevention: hand hygiene promoted     Problem: Wound  Goal: Optimal Coping  Outcome: Progressing  Goal: Optimal Functional Ability  Outcome: Progressing  Goal: Absence of Infection Signs and Symptoms  Outcome: Progressing  Intervention: Prevent or Manage Infection  Recent Flowsheet Documentation  Taken 03/07/2023 2000 by Alan Mulder A, RN  Infection Management: aseptic technique maintained  Goal: Improved Oral Intake  Outcome: Progressing  Goal: Optimal Pain Control and Function  Outcome: Progressing  Goal: Skin Health and Integrity  Outcome: Progressing  Intervention: Optimize Skin Protection  Recent Flowsheet Documentation  Taken 03/07/2023 2000 by Alan Mulder A, RN  Pressure Reduction Techniques: frequent weight shift encouraged  Head of Bed (HOB) Positioning: HOB at 20-30 degrees  Pressure Reduction Devices: pressure-redistributing mattress utilized  Goal: Optimal Wound Healing  Outcome: Progressing     Problem: Acute Kidney Injury/Impairment  Goal: Fluid and Electrolyte Balance  Outcome: Progressing  Goal: Improved Oral Intake  Outcome: Progressing  Goal: Effective Renal Function  Outcome: Progressing     Problem: Airway Clearance Ineffective  Goal: Effective Airway Clearance  Outcome: Progressing     Problem: Pain Acute  Goal: Optimal Pain Control and Function  Outcome: Progressing

## 2023-03-09 LAB — POTASSIUM: POTASSIUM: 4.3 mmol/L (ref 3.4–4.8)

## 2023-03-09 LAB — MAGNESIUM: MAGNESIUM: 1.3 mg/dL — ABNORMAL LOW (ref 1.6–2.6)

## 2023-03-09 LAB — IONIZED CALCIUM VENOUS: CALCIUM IONIZED VENOUS (MG/DL): 6.79 mg/dL (ref 4.40–5.40)

## 2023-03-09 LAB — BASIC METABOLIC PANEL
ANION GAP: 14 mmol/L (ref 5–14)
BLOOD UREA NITROGEN: 19 mg/dL (ref 9–23)
BUN / CREAT RATIO: 8
CALCIUM: 12 mg/dL — ABNORMAL HIGH (ref 8.7–10.4)
CHLORIDE: 110 mmol/L — ABNORMAL HIGH (ref 98–107)
CO2: 18 mmol/L — ABNORMAL LOW (ref 20.0–31.0)
CREATININE: 2.39 mg/dL — ABNORMAL HIGH (ref 0.73–1.18)
EGFR CKD-EPI (2021) MALE: 34 mL/min/{1.73_m2} — ABNORMAL LOW (ref >=60)
GLUCOSE RANDOM: 142 mg/dL (ref 70–179)
SODIUM: 142 mmol/L (ref 135–145)

## 2023-03-09 LAB — VITAMIN D 1,25 DIHYDROXY
VITAMIN D 1,25 D2: <8 pg/mL
VITAMIN D 1,25 D3: 40 pg/mL
VITAMIN D,1,25 (OH) 2, TOTAL: 40 pg/mL

## 2023-03-09 LAB — PHOSPHORUS: PHOSPHORUS: 2.9 mg/dL (ref 2.4–5.1)

## 2023-03-09 LAB — TACROLIMUS LEVEL, TROUGH: TACROLIMUS, TROUGH: 11.4 ng/mL (ref 5.0–15.0)

## 2023-03-09 MED ADMIN — methylPREDNISolone sodium succinate (PF) (SOLU-Medrol) 250 mg in sodium chloride (NS) 0.9 % 50 mL IVPB: 250 mg | INTRAVENOUS | @ 18:00:00 | Stop: 2023-03-12

## 2023-03-09 MED ADMIN — carvedilol (COREG) tablet 25 mg: 25 mg | ORAL | @ 02:00:00

## 2023-03-09 MED ADMIN — sodium chloride (NS) 0.9 % infusion: 125 mL/h | INTRAVENOUS | @ 06:00:00 | Stop: 2023-03-09

## 2023-03-09 MED ADMIN — carvedilol (COREG) tablet 25 mg: 25 mg | ORAL | @ 13:00:00

## 2023-03-09 MED ADMIN — predniSONE (DELTASONE) tablet 5 mg: 5 mg | ORAL | @ 13:00:00

## 2023-03-09 MED ADMIN — pantoprazole (Protonix) EC tablet 40 mg: 40 mg | ORAL | @ 13:00:00

## 2023-03-09 MED ADMIN — bictegrav-emtricit-tenofov ala (BIKTARVY) 50-200-25 mg tablet 1 tablet: 1 | ORAL | @ 13:00:00

## 2023-03-09 MED ADMIN — pantoprazole (Protonix) EC tablet 40 mg: 40 mg | ORAL | @ 02:00:00

## 2023-03-09 MED ADMIN — azathioprine (IMURAN) tablet 150 mg: 150 mg | ORAL | @ 02:00:00

## 2023-03-09 MED ADMIN — tacrolimus (PROGRAF) 11 mg combo product: 11 mg | ORAL | @ 02:00:00

## 2023-03-09 MED ADMIN — tacrolimus (PROGRAF) 11 mg combo product: 11 mg | ORAL | @ 13:00:00 | Stop: 2023-03-09

## 2023-03-09 MED ADMIN — minoxidil (LONITEN) tablet 2.5 mg: 2.5 mg | ORAL | @ 13:00:00

## 2023-03-09 NOTE — Unmapped (Signed)
VENOUS ACCESS TEAM PROCEDURE    Nurse request was placed for a PIV by Venous Access Team (VAT).  Patient was assessed at bedside for placement of a PIV. PPE were donned per protocol.  The patient is right arm only but his arm is swollen and did not want the IV at this time and wants to wait for the swelling to go down. Nurse was notified and she will report to the team.    Workup / Procedure Time:  15 minutes       Primary RN was notified.       Thank you,     Michel Santee, RN Venous Access Team

## 2023-03-09 NOTE — Unmapped (Signed)
Tacrolimus Therapeutic Monitoring Pharmacy Note    Christian Brown is a 43 y.o. male continuing tacrolimus.     Indication: Kidney transplant     Date of Transplant:  09/05/22       Prior Dosing Information: Home regimen tacrolimus 10 mg BID, currently on 11 mg BID       Source(s) of information used to determine prior to admission dosing: Clinic Note    Goals:  Therapeutic Drug Levels  Tacrolimus trough goal:  6-8 ng/ml    Additional Clinical Monitoring/Outcomes  Monitor renal function (SCr and urine output) and liver function (LFTs)  Monitor for signs/symptoms of adverse events (e.g., hyperglycemia, hyperkalemia, hypomagnesemia, hypertension, headache, tremor)    Results:   Tacrolimus level:  11.4 ng/mL, drawn ~1.5 hours early    Pharmacokinetic Considerations and Significant Drug Interactions:  Concurrent hepatotoxic medications: None identified  Concurrent CYP3A4 substrates/inhibitors: None identified  Concurrent nephrotoxic medications: None identified    Assessment/Plan:  Recommendedation(s)  Decrease to tacrolimus 11 mg AM and 10 mg PM    Follow-up  Daily levels with AM labs .   A pharmacist will continue to monitor and recommend levels as appropriate    Please page service pharmacist with questions/clarifications.    Lurline Del, PharmD

## 2023-03-09 NOTE — Unmapped (Signed)
VENOUS ACCESS TEAM PROCEDURE    Nurse request was placed for a PIV by Venous Access Team (VAT).  Patient was assessed at bedside for placement of a PIV. PPE were donned per protocol.  Access was obtained. Blood return noted.  Dressing intact and device well secured.  Flushed with normal saline.  See LDA for details.  Pt advised to inform RN of any s/s of discomfort at the PIV site.    Workup / Procedure Time:  30 minutes       Care RN was notified.      Thank you,     Taniqua Issa, RN Venous Access Team

## 2023-03-09 NOTE — Unmapped (Signed)
Nephrology ([facility]) Progress Note    Assessment & Plan:   Christian Brown is a 43 y.o. 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 Cr has continued to rise since 1/3. However with unknown cause for AKI, a renal biopsy of transplant kidney was completed on 1/7. HLA DSA reassuringly negative. Preliminary Renal pathology reports consistent with areas of calcium phosphate deposition with surrounding inflammation likely related to patient's known hyperparathyroid disease. However, there were also areas of inflammation not related to calcium deposition and thus transplant rejection cannot be excluded. In speaking with transplant nephrology, will begin 2-3 doses of Solumedrol 250mg . Will clarify with outpatient transplant team regarding length of dose.    - Solumedrol 250 mg x 2-3 doses  - daily RFP  - Final Renal Transplant surgical pathology pending   - continue home immunosuppression with tacrolimus 10mg  PO BID, azathioprine 105mg  PO daily, and prednisone 5mg  PO daily  (initially on MMF, but stopped 2/2 pill esophagitis/gastitis)    Chest Pain (resolved) - C/f Demand Ischemia   Patient complaining of constant chest pain on left side of chest that does not radiate to jaw/left arm. States it worsens when he lays down. Previously occurred when patient hypotensive. BP 111/65 at time of evaluation. Discussed obtaining EKG, however patient refused. He stated he understood that we could be missing potietnial cardiac dysfunction, including STEMI, if he refused the EKG. Troponin 182 - 164 and thus likely consistent with demand ischemia. Patient's pain resolved without additional intervention. On 1/10, patient without additional chest pain.   - CTM    C/f Multifocal Pneumonia - Hx of PJP Pneumonia - Productive Cough (resolved)   One month of shortness of breath along with productive cough. 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 Completed therapy for HCAP with levofloxacin. Patient continues to refuse atovaquone and understands the risk associated with refusing the medication. Reassuringly without productive cough. Reassuringly, PJP PCR negative on 1/9.   - ICID consulted appreciate recs  - atovaquone for PJP prophyalxis (has allergy to bactrim w/ rash, and pentamidine causeD bronchospasm, and dapsone caused cytopenias)   - fungitell assay,galactomannan ordered    Hypercalcemia (improving) - Secondary vs Tertiary Renal Hyperparathyroidism  Patient with elevated PTH, s/p 3 parathyroid glands removed. Planning for upcoming parathyroid gland removal on 1/15 for possible hyperactive parathyroid gland. Patient with persistent, but relatively stable serum Ca & ionized Ca. This is likely related to patient's known parathyroid disease. Considered cinacalet for him as well, but endorsed having an adverse reaction to it. Ionized Ca 6.79 this AM 1/9 after IV fluids on 1/8. Will hold off on additional fluids at this time given improvement.   - STOP IV NS 125 mL/hr   - daily BMP+Mg+Phos + ionized Ca  - Pending serum ACE, vitamin D levels      HTN  Noted that Goal BP is <130/80. BP WNL although slightly elevated thus resumed home minoxidil 2.5mg  daily. Could consider increasing to BID given persistently elevated diastolic pressures.   - Carvidilol 25mg  BID  - Minoxidil 2.5mg  daily      Chronic Problems  Anemia of Chronic Kidney Disease : Baseline Hgb 12-13, 11.9 on admission. Monitoring with daily CBC. B12, folate w/n limits. Monitoring with Daily CBC.   HIV: CD4 of 75 thought to be due thymoglobulin + immunosuppression. Reassuringly, HIV viral load <20. Continue home Biktarvy 1 tablet PO daily     Mild Intermittent Asthma - OSA: Uses albuterol inhaler as needed at home. Diagnosed with severe obstructive sleep apnea following 10/21/2019 sleep study. Does not use a CPAP at night. Stable on room air since admission. Continue albuterol PRN    Issues Impacting Complexity of Management:  -High risk of complications from pain and/or analgesia likely to result in delirium  -The patient is at high risk for the development of complications of volume overload due to the need to provide IV hydration for suspected hypovolemia in the setting of: stage IV or greater CKD  -The patient is at high risk of complications from hypercalcemia, infection in immunocompromised state    Daily Checklist:  Diet: Regular Diet  DVT PPx: Heparin 5000units q8h  Electrolytes: Replete Potassium to >/= 3.6 and Magnesium to >/= 1.8  Code Status: Full Code  Dispo:  Continue plan of care on floor    Team Contact Information:   Primary Team: Nephrology ([facility])  Primary Resident: Seleta Rhymes, MD  Resident's Pager: [phone] (Nephrology Intern - Tower)    Interval History:   No acute events overnight.    Patient overall feeling okay this AM. Some subjective swelling in right arm he feels is related to PIV. Chest pain has resolved.     Objective:   Temp:  [36.4 ??C (97.5 ??F)] 36.4 ??C (97.5 ??F)  Heart Rate:  [83-94] 83  Resp:  [16-18] 16  BP: (130-146)/(84-101) 146/101  SpO2:  [100 %] 100 %    Gen: NAD, converses appropriately  HENT: atraumatic, normocephalic.   Heart: RRR, no murmurs.   Lungs: CTAB, no crackles or wheezes  Abdomen: soft, NTND  Extremities: fistula clotted but present in LUE, thrill felt from previous fistula of left lower forearm present, mild swelling in right anterior arm around PIV site      Seleta Rhymes, MD  Internal Medicine PGY 1

## 2023-03-09 NOTE — Unmapped (Signed)
Patient identified as a Difficult Vascular Access patient due to left arm fistula and right arm infiltration.  Patient preferring not to have PIV placed overnight after an infiltration.  Patient wanting to wait until swelling subsides.  VAT received new order for PIV placement during day shift.  Patient again was reluctant to have IV placed, but did agree.  PIV was placed.  Dr. Madilyn Fireman was notified and is aware of difficulties with IV placement.  Reportedly patient only needs 2 more doses of IV medication.  VAT will attempt to maintain access as needed.  Team will be contacted if further difficulties arise.    PIV Workup / Procedure Time:  15 minutes    Thank you,     Iantha Fallen, RN Venous Access Team

## 2023-03-09 NOTE — Unmapped (Signed)
Patient is alert and oriented,denies pain,vital signs stable,am,independent in the room,fall precautions in place,call bell within reach.  Problem: Adult Inpatient Plan of Care  Goal: Plan of Care Review  Outcome: Ongoing - Unchanged  Goal: Patient-Specific Goal (Individualized)  Outcome: Ongoing - Unchanged  Goal: Absence of Hospital-Acquired Illness or Injury  Outcome: Ongoing - Unchanged  Intervention: Identify and Manage Fall Risk  Recent Flowsheet Documentation  Taken 03/08/2023 1920 by Hughes Better, RN  Safety Interventions:   fall reduction program maintained   low bed  Intervention: Prevent Skin Injury  Recent Flowsheet Documentation  Taken 03/08/2023 1920 by Hughes Better, RN  Positioning for Skin: (sitting in bed) Other (Comment)  Device Skin Pressure Protection: adhesive use limited  Skin Protection: adhesive use limited  Intervention: Prevent Infection  Recent Flowsheet Documentation  Taken 03/08/2023 1920 by Hughes Better, RN  Infection Prevention: hand hygiene promoted  Goal: Optimal Comfort and Wellbeing  Outcome: Ongoing - Unchanged  Goal: Readiness for Transition of Care  Outcome: Ongoing - Unchanged  Goal: Rounds/Family Conference  Outcome: Ongoing - Unchanged     Problem: Infection  Goal: Absence of Infection Signs and Symptoms  Outcome: Ongoing - Unchanged  Intervention: Prevent or Manage Infection  Recent Flowsheet Documentation  Taken 03/08/2023 1920 by Hughes Better, RN  Infection Management: aseptic technique maintained     Problem: HIV/AIDS Infection  Goal: HIV/AIDS Symptom Control  Outcome: Ongoing - Unchanged  Intervention: Prevent Infection and Maximize Resistance  Recent Flowsheet Documentation  Taken 03/08/2023 1920 by Hughes Better, RN  Infection Prevention: hand hygiene promoted     Problem: Acute Kidney Injury/Impairment  Goal: Fluid and Electrolyte Balance  Outcome: Ongoing - Unchanged  Goal: Improved Oral Intake  Outcome: Ongoing - Unchanged  Goal: Effective Renal Function  Outcome: Ongoing - Unchanged     Problem: Chronic Kidney Disease  Goal: Optimal Coping with Chronic Illness  Outcome: Ongoing - Unchanged  Goal: Electrolyte Balance  Outcome: Ongoing - Unchanged  Goal: Fluid Balance  Outcome: Ongoing - Unchanged  Intervention: Monitor and Manage Hypervolemia  Recent Flowsheet Documentation  Taken 03/08/2023 1920 by Hughes Better, RN  Skin Protection: adhesive use limited  Goal: Optimal Functional Ability  Outcome: Ongoing - Unchanged  Goal: Absence of Anemia Signs and Symptoms  Outcome: Ongoing - Unchanged  Intervention: Manage Signs of Anemia and Bleeding  Recent Flowsheet Documentation  Taken 03/08/2023 1920 by Hughes Better, RN  Bleeding Precautions: monitored for signs of bleeding  Goal: Optimal Oral Intake  Outcome: Ongoing - Unchanged  Goal: Acceptable Pain Control  Outcome: Ongoing - Unchanged  Goal: Minimize Renal Failure Effects  Outcome: Ongoing - Unchanged     Problem: Airway Clearance Ineffective  Goal: Effective Airway Clearance  Outcome: Ongoing - Unchanged     Problem: Pain Acute  Goal: Optimal Pain Control and Function  Outcome: Ongoing - Unchanged

## 2023-03-10 LAB — BASIC METABOLIC PANEL
ANION GAP: 11 mmol/L (ref 5–14)
BLOOD UREA NITROGEN: 22 mg/dL (ref 9–23)
BUN / CREAT RATIO: 10
CALCIUM: 12.3 mg/dL (ref 8.7–10.4)
CHLORIDE: 107 mmol/L (ref 98–107)
CO2: 22 mmol/L (ref 20.0–31.0)
CREATININE: 2.11 mg/dL — ABNORMAL HIGH (ref 0.73–1.18)
EGFR CKD-EPI (2021) MALE: 40 mL/min/{1.73_m2} — ABNORMAL LOW (ref >=60)
GLUCOSE RANDOM: 131 mg/dL (ref 70–179)
POTASSIUM: 4.2 mmol/L (ref 3.4–4.8)
SODIUM: 140 mmol/L (ref 135–145)

## 2023-03-10 LAB — CBC
HEMATOCRIT: 34.4 % — ABNORMAL LOW (ref 39.0–48.0)
HEMOGLOBIN: 11.8 g/dL — ABNORMAL LOW (ref 12.9–16.5)
MEAN CORPUSCULAR HEMOGLOBIN CONC: 34.4 g/dL (ref 32.0–36.0)
MEAN CORPUSCULAR HEMOGLOBIN: 35.9 pg — ABNORMAL HIGH (ref 25.9–32.4)
MEAN CORPUSCULAR VOLUME: 104.4 fL — ABNORMAL HIGH (ref 77.6–95.7)
MEAN PLATELET VOLUME: 8.4 fL (ref 6.8–10.7)
PLATELET COUNT: 163 10*9/L (ref 150–450)
RED BLOOD CELL COUNT: 3.29 10*12/L — ABNORMAL LOW (ref 4.26–5.60)
RED CELL DISTRIBUTION WIDTH: 14.7 % (ref 12.2–15.2)
WBC ADJUSTED: 4.5 10*9/L (ref 3.6–11.2)

## 2023-03-10 LAB — PHOSPHORUS: PHOSPHORUS: 2.4 mg/dL (ref 2.4–5.1)

## 2023-03-10 LAB — IONIZED CALCIUM VENOUS: CALCIUM IONIZED VENOUS (MG/DL): 6.8 mg/dL (ref 4.40–5.40)

## 2023-03-10 LAB — MAGNESIUM: MAGNESIUM: 1.3 mg/dL — ABNORMAL LOW (ref 1.6–2.6)

## 2023-03-10 LAB — TACROLIMUS LEVEL, TROUGH: TACROLIMUS, TROUGH: 7.8 ng/mL (ref 5.0–15.0)

## 2023-03-10 MED ORDER — CARVEDILOL 25 MG TABLET
ORAL_TABLET | Freq: Two times a day (BID) | ORAL | 0 refills | 30.00 days
Start: 2023-03-10 — End: 2023-04-09

## 2023-03-10 MED ADMIN — pantoprazole (Protonix) EC tablet 40 mg: 40 mg | ORAL | @ 02:00:00

## 2023-03-10 MED ADMIN — pantoprazole (Protonix) EC tablet 40 mg: 40 mg | ORAL | @ 14:00:00

## 2023-03-10 MED ADMIN — tacrolimus (PROGRAF) capsule 10 mg: 10 mg | ORAL | @ 02:00:00

## 2023-03-10 MED ADMIN — bictegrav-emtricit-tenofov ala (BIKTARVY) 50-200-25 mg tablet 1 tablet: 1 | ORAL | @ 14:00:00

## 2023-03-10 MED ADMIN — azathioprine (IMURAN) tablet 150 mg: 150 mg | ORAL | @ 02:00:00

## 2023-03-10 MED ADMIN — methylPREDNISolone sodium succinate (PF) (SOLU-Medrol) 250 mg in sodium chloride (NS) 0.9 % 50 mL IVPB: 250 mg | INTRAVENOUS | @ 15:00:00 | Stop: 2023-03-10

## 2023-03-10 MED ADMIN — carvedilol (COREG) tablet 25 mg: 25 mg | ORAL | @ 05:00:00

## 2023-03-10 MED ADMIN — tacrolimus (PROGRAF) 11 mg combo product: 11 mg | ORAL | @ 14:00:00

## 2023-03-10 NOTE — Unmapped (Signed)
Patient is alert and oriented,denies pain,vital signs stable,tele monitoring in place,fall precaution in place,call bell within reach.  Problem: Adult Inpatient Plan of Care  Goal: Plan of Care Review  Outcome: Ongoing - Unchanged  Goal: Patient-Specific Goal (Individualized)  Outcome: Ongoing - Unchanged  Goal: Absence of Hospital-Acquired Illness or Injury  Outcome: Ongoing - Unchanged  Intervention: Identify and Manage Fall Risk  Recent Flowsheet Documentation  Taken 03/09/2023 1925 by Hughes Better, RN  Safety Interventions:   fall reduction program maintained   low bed  Intervention: Prevent Skin Injury  Recent Flowsheet Documentation  Taken 03/09/2023 1925 by Hughes Better, RN  Positioning for Skin: Supine/Back  Device Skin Pressure Protection: adhesive use limited  Skin Protection: adhesive use limited  Intervention: Prevent Infection  Recent Flowsheet Documentation  Taken 03/09/2023 1925 by Hughes Better, RN  Infection Prevention: hand hygiene promoted  Goal: Optimal Comfort and Wellbeing  Outcome: Ongoing - Unchanged  Goal: Readiness for Transition of Care  Outcome: Ongoing - Unchanged  Goal: Rounds/Family Conference  Outcome: Ongoing - Unchanged     Problem: Infection  Goal: Absence of Infection Signs and Symptoms  Outcome: Ongoing - Unchanged  Intervention: Prevent or Manage Infection  Recent Flowsheet Documentation  Taken 03/09/2023 1925 by Hughes Better, RN  Infection Management: aseptic technique maintained     Problem: HIV/AIDS Infection  Goal: HIV/AIDS Symptom Control  Outcome: Ongoing - Unchanged  Intervention: Prevent Infection and Maximize Resistance  Recent Flowsheet Documentation  Taken 03/09/2023 1925 by Hughes Better, RN  Infection Prevention: hand hygiene promoted     Problem: Wound  Goal: Optimal Coping  Outcome: Ongoing - Unchanged  Goal: Optimal Functional Ability  Outcome: Ongoing - Unchanged  Goal: Absence of Infection Signs and Symptoms  Outcome: Ongoing - Unchanged  Intervention: Prevent or Manage Infection  Recent Flowsheet Documentation  Taken 03/09/2023 1925 by Hughes Better, RN  Infection Management: aseptic technique maintained  Goal: Improved Oral Intake  Outcome: Ongoing - Unchanged  Goal: Optimal Pain Control and Function  Outcome: Ongoing - Unchanged  Goal: Skin Health and Integrity  Outcome: Ongoing - Unchanged  Intervention: Optimize Skin Protection  Recent Flowsheet Documentation  Taken 03/09/2023 1925 by Hughes Better, RN  Pressure Reduction Techniques: frequent weight shift encouraged  Pressure Reduction Devices: positioning supports utilized  Skin Protection: adhesive use limited  Goal: Optimal Wound Healing  Outcome: Ongoing - Unchanged     Problem: Acute Kidney Injury/Impairment  Goal: Fluid and Electrolyte Balance  Outcome: Ongoing - Unchanged  Goal: Improved Oral Intake  Outcome: Ongoing - Unchanged  Goal: Effective Renal Function  Outcome: Ongoing - Unchanged

## 2023-03-10 NOTE — Unmapped (Signed)
Patient refusing some medications this shift but did accept a new IV. Patient had first dose of methylprednisone. Pt refused heparin subcutaneous, RN encouraged ambulation.       Problem: Adult Inpatient Plan of Care  Goal: Plan of Care Review  Outcome: Progressing  Goal: Patient-Specific Goal (Individualized)  Outcome: Progressing  Goal: Absence of Hospital-Acquired Illness or Injury  Outcome: Progressing  Intervention: Prevent Skin Injury  Recent Flowsheet Documentation  Taken 03/09/2023 1030 by Arcola Jansky, RN  Positioning for Skin: Supine/Back  Taken 03/09/2023 0830 by Arcola Jansky, RN  Positioning for Skin: Supine/Back  Device Skin Pressure Protection: adhesive use limited  Skin Protection: adhesive use limited  Intervention: Prevent and Manage VTE (Venous Thromboembolism) Risk  Recent Flowsheet Documentation  Taken 03/09/2023 0830 by Arcola Jansky, RN  VTE Prevention/Management: ambulation promoted  Goal: Optimal Comfort and Wellbeing  Outcome: Progressing  Goal: Readiness for Transition of Care  Outcome: Progressing  Goal: Rounds/Family Conference  Outcome: Progressing     Problem: Infection  Goal: Absence of Infection Signs and Symptoms  Outcome: Progressing     Problem: HIV/AIDS Infection  Goal: HIV/AIDS Symptom Control  Outcome: Progressing     Problem: Wound  Goal: Optimal Coping  Outcome: Progressing  Goal: Optimal Functional Ability  Outcome: Progressing  Intervention: Optimize Functional Ability  Recent Flowsheet Documentation  Taken 03/09/2023 1253 by Arcola Jansky, RN  Activity Management: up ad lib  Taken 03/09/2023 1030 by Arcola Jansky, RN  Activity Management: up ad lib  Taken 03/09/2023 0830 by Arcola Jansky, RN  Activity Management: up ad lib  Goal: Absence of Infection Signs and Symptoms  Outcome: Progressing  Goal: Improved Oral Intake  Outcome: Progressing  Goal: Optimal Pain Control and Function  Outcome: Progressing  Goal: Skin Health and Integrity  Outcome: Progressing  Intervention: Optimize Skin Protection  Recent Flowsheet Documentation  Taken 03/09/2023 1253 by Arcola Jansky, RN  Activity Management: up ad lib  Taken 03/09/2023 1030 by Arcola Jansky, RN  Activity Management: up ad lib  Taken 03/09/2023 0830 by Arcola Jansky, RN  Activity Management: up ad lib  Pressure Reduction Techniques: frequent weight shift encouraged  Pressure Reduction Devices: positioning supports utilized  Skin Protection: adhesive use limited  Goal: Optimal Wound Healing  Outcome: Progressing     Problem: Acute Kidney Injury/Impairment  Goal: Fluid and Electrolyte Balance  Outcome: Progressing  Goal: Improved Oral Intake  Outcome: Progressing  Goal: Effective Renal Function  Outcome: Progressing     Problem: Chronic Kidney Disease  Goal: Optimal Coping with Chronic Illness  Outcome: Progressing  Goal: Electrolyte Balance  Outcome: Progressing  Goal: Fluid Balance  Outcome: Progressing  Intervention: Monitor and Manage Hypervolemia  Recent Flowsheet Documentation  Taken 03/09/2023 0830 by Arcola Jansky, RN  Skin Protection: adhesive use limited  Goal: Optimal Functional Ability  Outcome: Progressing  Intervention: Optimize Functional Ability  Recent Flowsheet Documentation  Taken 03/09/2023 1253 by Arcola Jansky, RN  Activity Management: up ad lib  Taken 03/09/2023 1030 by Arcola Jansky, RN  Activity Management: up ad lib  Taken 03/09/2023 0830 by Arcola Jansky, RN  Activity Management: up ad lib  Goal: Absence of Anemia Signs and Symptoms  Outcome: Progressing  Goal: Optimal Oral Intake  Outcome: Progressing  Goal: Acceptable Pain Control  Outcome: Progressing  Goal: Minimize Renal Failure Effects  Outcome: Progressing     Problem: Airway Clearance Ineffective  Goal: Effective Airway Clearance  Outcome: Progressing  Intervention: Promote Airway Secretion Clearance  Recent Flowsheet Documentation  Taken  03/09/2023 1253 by Arcola Jansky, RN  Activity Management: up ad lib  Taken 03/09/2023 1030 by Arcola Jansky, RN  Activity Management: up ad lib  Taken 03/09/2023 0830 by Arcola Jansky, RN  Activity Management: up ad lib     Problem: Pain Acute  Goal: Optimal Pain Control and Function  Outcome: Progressing     Problem: Comorbidity Management  Goal: Blood Glucose Levels Within Targeted Range  Outcome: Progressing

## 2023-03-10 NOTE — Unmapped (Signed)
Goal date update for care plans

## 2023-03-11 LAB — IONIZED CALCIUM VENOUS: CALCIUM IONIZED VENOUS (MG/DL): 6.92 mg/dL (ref 4.40–5.40)

## 2023-03-11 LAB — CBC
HEMATOCRIT: 33.3 % — ABNORMAL LOW (ref 39.0–48.0)
HEMOGLOBIN: 11.6 g/dL — ABNORMAL LOW (ref 12.9–16.5)
MEAN CORPUSCULAR HEMOGLOBIN CONC: 34.8 g/dL (ref 32.0–36.0)
MEAN CORPUSCULAR HEMOGLOBIN: 36.2 pg — ABNORMAL HIGH (ref 25.9–32.4)
MEAN CORPUSCULAR VOLUME: 104.2 fL — ABNORMAL HIGH (ref 77.6–95.7)
MEAN PLATELET VOLUME: 8.7 fL (ref 6.8–10.7)
PLATELET COUNT: 148 10*9/L — ABNORMAL LOW (ref 150–450)
RED BLOOD CELL COUNT: 3.2 10*12/L — ABNORMAL LOW (ref 4.26–5.60)
RED CELL DISTRIBUTION WIDTH: 15.2 % (ref 12.2–15.2)
WBC ADJUSTED: 5.5 10*9/L (ref 3.6–11.2)

## 2023-03-11 LAB — BASIC METABOLIC PANEL
ANION GAP: 12 mmol/L (ref 5–14)
BLOOD UREA NITROGEN: 28 mg/dL — ABNORMAL HIGH (ref 9–23)
BUN / CREAT RATIO: 16
CALCIUM: 12 mg/dL — ABNORMAL HIGH (ref 8.7–10.4)
CHLORIDE: 109 mmol/L — ABNORMAL HIGH (ref 98–107)
CO2: 21 mmol/L (ref 20.0–31.0)
CREATININE: 1.8 mg/dL — ABNORMAL HIGH (ref 0.73–1.18)
EGFR CKD-EPI (2021) MALE: 48 mL/min/{1.73_m2} — ABNORMAL LOW (ref >=60)
GLUCOSE RANDOM: 133 mg/dL (ref 70–179)
POTASSIUM: 4.3 mmol/L (ref 3.4–4.8)
SODIUM: 142 mmol/L (ref 135–145)

## 2023-03-11 LAB — MAGNESIUM: MAGNESIUM: 1.3 mg/dL — ABNORMAL LOW (ref 1.6–2.6)

## 2023-03-11 LAB — TACROLIMUS LEVEL, TROUGH: TACROLIMUS, TROUGH: 10.1 ng/mL (ref 5.0–15.0)

## 2023-03-11 LAB — PHOSPHORUS: PHOSPHORUS: 2 mg/dL — ABNORMAL LOW (ref 2.4–5.1)

## 2023-03-11 MED ORDER — MINOXIDIL 2.5 MG TABLET
ORAL_TABLET | Freq: Every day | ORAL | 0 refills | 30.00 days
Start: 2023-03-11 — End: 2023-04-10

## 2023-03-11 MED ORDER — ATOVAQUONE 750 MG/5 ML ORAL SUSPENSION
Freq: Every day | ORAL | 0 refills | 21.00 days
Start: 2023-03-11 — End: 2023-04-10

## 2023-03-11 MED ADMIN — carvedilol (COREG) tablet 25 mg: 25 mg | ORAL | @ 02:00:00

## 2023-03-11 MED ADMIN — predniSONE (DELTASONE) tablet 60 mg: 60 mg | ORAL | @ 15:00:00 | Stop: 2023-03-11

## 2023-03-11 MED ADMIN — pantoprazole (Protonix) EC tablet 40 mg: 40 mg | ORAL | @ 02:00:00

## 2023-03-11 MED ADMIN — minoxidil (LONITEN) tablet 2.5 mg: 2.5 mg | ORAL | @ 15:00:00

## 2023-03-11 MED ADMIN — bictegrav-emtricit-tenofov ala (BIKTARVY) 50-200-25 mg tablet 1 tablet: 1 | ORAL | @ 15:00:00

## 2023-03-11 MED ADMIN — pantoprazole (Protonix) EC tablet 40 mg: 40 mg | ORAL | @ 15:00:00

## 2023-03-11 MED ADMIN — tacrolimus (PROGRAF) 11 mg combo product: 11 mg | ORAL | @ 15:00:00 | Stop: 2023-03-11

## 2023-03-11 MED ADMIN — azathioprine (IMURAN) tablet 150 mg: 150 mg | ORAL | @ 02:00:00

## 2023-03-11 MED ADMIN — tacrolimus (PROGRAF) capsule 10 mg: 10 mg | ORAL | @ 02:00:00

## 2023-03-11 NOTE — Unmapped (Signed)
Tacrolimus Therapeutic Monitoring Pharmacy Note    Christian Brown is a 43 y.o. male continuing tacrolimus.     Indication: Kidney transplant     Date of Transplant:  09/05/22       Prior Dosing Information: Home regimen tacrolimus 10 mg BID, currently on tac 11 mg AM and 10 mg PM       Source(s) of information used to determine prior to admission dosing: Clinic Note    Goals:  Therapeutic Drug Levels  Tacrolimus trough goal:  6-8 ng/ml    Additional Clinical Monitoring/Outcomes  Monitor renal function (SCr and urine output) and liver function (LFTs)  Monitor for signs/symptoms of adverse events (e.g., hyperglycemia, hyperkalemia, hypomagnesemia, hypertension, headache, tremor)    Results:   Tacrolimus level:  10.1 ng/mL, drawn ~2 hours early    Pharmacokinetic Considerations and Significant Drug Interactions:  Concurrent hepatotoxic medications: None identified  Concurrent CYP3A4 substrates/inhibitors: None identified  Concurrent nephrotoxic medications: None identified    Assessment/Plan:  Recommendedation(s)  Decrease to tacrolimus 10 mg BID    Follow-up  Daily levels with AM labs .   A pharmacist will continue to monitor and recommend levels as appropriate    Please page service pharmacist with questions/clarifications.    Beryle Lathe, PharmD

## 2023-03-11 NOTE — Unmapped (Signed)
Nephrology ([facility]) Progress Note    Assessment & Plan:   Almalik Weissberg is a 43 y.o. 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 However with unknown cause for AKI, a renal biopsy of transplant kidney was completed on 1/7. HLA DSA reassuringly negative. 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 A few non scared area have mild tubulitis not associated with CaPO4 deposits so cannot excluded concurrent TCMR. Thus decision was made to pursue Solumedrol x 2 doses. After completion, patient will begin prednisone taper as below.   - Solumedrol 250 mg x 2 doses (1/10 - 1/11)   - Prednisone taper starting on 1/12:    Prednisone 60 mg (1/12) -> Pred 40 mg (1/13, 1/14) - Pred 20 mg (1/15, 1/16) - Pred 10 mg (1/17) - Pred 5 mg (1/18)   - daily RFP  - Final Renal Transplant surgical pathology pending   - continue home immunosuppression with tacrolimus 10mg  PO BID, azathioprine 105mg  PO daily (initially on MMF, but stopped 2/2 pill esophagitis/gastitis)  - HOLD home prednisone 5 mg iso of Solumedrol and Pred taper     C/f Multifocal Pneumonia - Hx of PJP Pneumonia - Productive Cough (resolved)   One month of shortness of breath along with productive cough. 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 Completed therapy for HCAP with levofloxacin. Patient continues to refuse atovaquone and understands the risk associated with refusing the medication. Reassuringly without productive cough. Reassuringly, PJP PCR negative on 1/9.   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 Planning for upcoming parathyroid gland removal on 1/15 for possible hyperactive parathyroid gland. Patient with persistent, but relatively stable serum Ca & ionized Ca. This is likely related to patient's known parathyroid  disease. Considered cinacalet for him as well, but endorsed having an adverse reaction to it. Patient to remain inpatient prior to surgery with Dr. Selena Batten until 1/15 for planned parathyroidectomy.   - daily BMP+Mg+Phos + ionized Ca      HTN  Noted that Goal BP is <130/80. BP WNL although slightly elevated thus resumed home minoxidil 2.5mg  daily.   - Carvidilol 25mg  BID  - Minoxidil 2.5mg  daily Chronic Problems  Anemia of Chronic Kidney Disease : Baseline Hgb 12-13, 11.9 on admission. Monitoring with daily CBC. B12, folate w/n limits. Monitoring with Daily CBC.   HIV: CD4 of 75 thought to be due thymoglobulin + immunosuppression. Reassuringly, HIV viral load <20. Continue home Biktarvy 1 tablet PO daily     Mild Intermittent Asthma - OSA: Uses albuterol inhaler as needed at home. Diagnosed with severe obstructive sleep apnea following 10/21/2019 sleep study. Does not use a CPAP at night. Stable on room air since admission. Continue albuterol PRN    Issues Impacting Complexity of Management:  -High risk of complications from pain and/or analgesia likely to result in delirium  -The patient is at high risk for the development of complications of volume overload due to the need to provide IV hydration for suspected hypovolemia in the setting of: stage IV or greater CKD  -The patient is at high risk of complications from hypercalcemia, infection in immunocompromised state    Daily Checklist:  Diet: Regular Diet  DVT PPx: Heparin 5000units q8h  Electrolytes: Replete Potassium to >/= 3.6 and Magnesium to >/= 1.8  Code Status: Full Code  Dispo:  Continue plan of care on floor    Team Contact Information:   Primary Team: Nephrology ([facility])  Primary Resident: Seleta Rhymes, MD  Resident's Pager: [phone] (Nephrology Intern - Tower)    Interval History:   No acute events overnight.    Patient feeling tired this AM but no acute complaints today.     Objective:   Temp:  [36.6 ??C (97.9 ??F)] 36.6 ??C (97.9 ??F)  Heart Rate:  [91-109] 91  Resp:  [18] 18  BP: (120-147)/(82-89) 120/82  SpO2:  [97 %] 97 %    Gen: NAD, converses appropriately  HENT: atraumatic, normocephalic.   Heart: RRR, no murmurs.   Lungs: CTAB, no crackles or wheezes  Abdomen: soft, NTND  Extremities: fistula clotted but present in LUE, thrill felt from previous fistula of left lower forearm present, mild swelling in right anterior arm around PIV site Seleta Rhymes, MD  Internal Medicine PGY 1

## 2023-03-11 NOTE — Unmapped (Signed)
Alert and oriented. VSS.  Non compliant with care plan. Education given about the benefits of being compliant with hypertension medication orders-has wrong perception that should only take medication when BP is high than maintaining BP at within normal limits.  Denies pain nor discomfort during day shift.   Problem: Infection  Goal: Absence of Infection Signs and Symptoms  03/11/2023 1533 by Curlene Labrum, RN  Outcome: Ongoing - Unchanged  03/11/2023 1533 by Curlene Labrum, RN  Outcome: Ongoing - Unchanged  Intervention: Prevent or Manage Infection  Recent Flowsheet Documentation  Taken 03/11/2023 0800 by Curlene Labrum, RN  Infection Management: aseptic technique maintained     Problem: Acute Kidney Injury/Impairment  Goal: Fluid and Electrolyte Balance  03/11/2023 1533 by Curlene Labrum, RN  Outcome: Ongoing - Unchanged  03/11/2023 1533 by Curlene Labrum, RN  Outcome: Ongoing - Unchanged  Goal: Improved Oral Intake  03/11/2023 1533 by Curlene Labrum, RN  Outcome: Ongoing - Unchanged  03/11/2023 1533 by Curlene Labrum, RN  Outcome: Ongoing - Unchanged  Goal: Effective Renal Function  03/11/2023 1533 by Curlene Labrum, RN  Outcome: Ongoing - Unchanged  03/11/2023 1533 by Curlene Labrum, RN  Outcome: Ongoing - Unchanged     Problem: Chronic Kidney Disease  Goal: Optimal Coping with Chronic Illness  03/11/2023 1533 by Curlene Labrum, RN  Outcome: Ongoing - Unchanged  03/11/2023 1533 by Curlene Labrum, RN  Outcome: Ongoing - Unchanged  Goal: Electrolyte Balance  03/11/2023 1533 by Curlene Labrum, RN  Outcome: Ongoing - Unchanged  03/11/2023 1533 by Curlene Labrum, RN  Outcome: Ongoing - Unchanged  Goal: Fluid Balance  03/11/2023 1533 by Curlene Labrum, RN  Outcome: Ongoing - Unchanged  03/11/2023 1533 by Curlene Labrum, RN  Outcome: Ongoing - Unchanged  Intervention: Monitor and Manage Hypervolemia  Recent Flowsheet Documentation  Taken 03/11/2023 0800 by Curlene Labrum, RN  Skin Protection: adhesive use limited  Goal: Optimal Functional Ability  03/11/2023 1533 by Curlene Labrum, RN  Outcome: Ongoing - Unchanged  03/11/2023 1533 by Curlene Labrum, RN  Outcome: Ongoing - Unchanged  Goal: Absence of Anemia Signs and Symptoms  03/11/2023 1533 by Curlene Labrum, RN  Outcome: Ongoing - Unchanged  03/11/2023 1533 by Curlene Labrum, RN  Outcome: Ongoing - Unchanged  Intervention: Manage Signs of Anemia and Bleeding  Recent Flowsheet Documentation  Taken 03/11/2023 0800 by Curlene Labrum, RN  Bleeding Precautions:   blood pressure closely monitored   monitored for signs of bleeding  Goal: Optimal Oral Intake  03/11/2023 1533 by Curlene Labrum, RN  Outcome: Ongoing - Unchanged  03/11/2023 1533 by Curlene Labrum, RN  Outcome: Ongoing - Unchanged  Goal: Acceptable Pain Control  03/11/2023 1533 by Curlene Labrum, RN  Outcome: Ongoing - Unchanged  03/11/2023 1533 by Curlene Labrum, RN  Outcome: Ongoing - Unchanged  Goal: Minimize Renal Failure Effects  03/11/2023 1533 by Curlene Labrum, RN  Outcome: Ongoing - Unchanged  03/11/2023 1533 by Curlene Labrum, RN  Outcome: Ongoing - Unchanged     Problem: Comorbidity Management  Goal: Blood Glucose Levels Within Targeted Range  03/11/2023 1533 by Curlene Labrum, RN  Outcome: Ongoing - Unchanged  03/11/2023 1533 by Curlene Labrum, RN  Outcome: Ongoing - Unchanged

## 2023-03-11 NOTE — Unmapped (Signed)
Patient is alert and oriented,denies pain,input and output monitored ambulates independently in the room, fall precautions in place,call bell within reach.  Problem: Adult Inpatient Plan of Care  Goal: Plan of Care Review  Outcome: Progressing  Goal: Patient-Specific Goal (Individualized)  Outcome: Progressing  Goal: Absence of Hospital-Acquired Illness or Injury  Outcome: Progressing  Intervention: Identify and Manage Fall Risk  Recent Flowsheet Documentation  Taken 03/10/2023 1930 by Hughes Better, RN  Safety Interventions:   fall reduction program maintained   low bed  Intervention: Prevent Skin Injury  Recent Flowsheet Documentation  Taken 03/10/2023 1930 by Hughes Better, RN  Positioning for Skin: (sitting in bed) Other (Comment)  Device Skin Pressure Protection: adhesive use limited  Skin Protection: adhesive use limited  Intervention: Prevent Infection  Recent Flowsheet Documentation  Taken 03/10/2023 1930 by Hughes Better, RN  Infection Prevention: hand hygiene promoted  Goal: Optimal Comfort and Wellbeing  Outcome: Progressing  Goal: Readiness for Transition of Care  Outcome: Progressing  Goal: Rounds/Family Conference  Outcome: Progressing     Problem: Infection  Goal: Absence of Infection Signs and Symptoms  Outcome: Progressing  Intervention: Prevent or Manage Infection  Recent Flowsheet Documentation  Taken 03/10/2023 1930 by Hughes Better, RN  Infection Management: aseptic technique maintained     Problem: HIV/AIDS Infection  Goal: HIV/AIDS Symptom Control  Outcome: Progressing  Intervention: Prevent Infection and Maximize Resistance  Recent Flowsheet Documentation  Taken 03/10/2023 1930 by Hughes Better, RN  Infection Prevention: hand hygiene promoted     Problem: Acute Kidney Injury/Impairment  Goal: Fluid and Electrolyte Balance  Outcome: Progressing  Goal: Improved Oral Intake  Outcome: Progressing  Goal: Effective Renal Function  Outcome: Progressing     Problem: Airway Clearance Ineffective  Goal: Effective Airway Clearance  Outcome: Progressing     Problem: Pain Acute  Goal: Optimal Pain Control and Function  Outcome: Progressing     Problem: Comorbidity Management  Goal: Blood Glucose Levels Within Targeted Range  Outcome: Progressing

## 2023-03-12 DIAGNOSIS — Z940 Kidney transplant status: Principal | ICD-10-CM

## 2023-03-12 DIAGNOSIS — Z79899 Other long term (current) drug therapy: Principal | ICD-10-CM

## 2023-03-12 LAB — BASIC METABOLIC PANEL
ANION GAP: 12 mmol/L (ref 5–14)
BLOOD UREA NITROGEN: 24 mg/dL — ABNORMAL HIGH (ref 9–23)
BUN / CREAT RATIO: 15
CALCIUM: 11.5 mg/dL — ABNORMAL HIGH (ref 8.7–10.4)
CHLORIDE: 108 mmol/L — ABNORMAL HIGH (ref 98–107)
CO2: 21 mmol/L (ref 20.0–31.0)
CREATININE: 1.62 mg/dL — ABNORMAL HIGH (ref 0.73–1.18)
EGFR CKD-EPI (2021) MALE: 55 mL/min/{1.73_m2} — ABNORMAL LOW (ref >=60)
GLUCOSE RANDOM: 118 mg/dL (ref 70–179)
SODIUM: 141 mmol/L (ref 135–145)

## 2023-03-12 LAB — MAGNESIUM: MAGNESIUM: 1.1 mg/dL — ABNORMAL LOW (ref 1.6–2.6)

## 2023-03-12 LAB — CBC
HEMATOCRIT: 32.5 % — ABNORMAL LOW (ref 39.0–48.0)
HEMOGLOBIN: 11.4 g/dL — ABNORMAL LOW (ref 12.9–16.5)
MEAN CORPUSCULAR HEMOGLOBIN CONC: 35.1 g/dL (ref 32.0–36.0)
MEAN CORPUSCULAR HEMOGLOBIN: 35.9 pg — ABNORMAL HIGH (ref 25.9–32.4)
MEAN CORPUSCULAR VOLUME: 102.4 fL — ABNORMAL HIGH (ref 77.6–95.7)
MEAN PLATELET VOLUME: 8.2 fL (ref 6.8–10.7)
PLATELET COUNT: 133 10*9/L — ABNORMAL LOW (ref 150–450)
RED BLOOD CELL COUNT: 3.18 10*12/L — ABNORMAL LOW (ref 4.26–5.60)
RED CELL DISTRIBUTION WIDTH: 15.1 % (ref 12.2–15.2)
WBC ADJUSTED: 4.2 10*9/L (ref 3.6–11.2)

## 2023-03-12 LAB — POTASSIUM: POTASSIUM: 4.6 mmol/L (ref 3.5–5.1)

## 2023-03-12 LAB — TACROLIMUS LEVEL, TROUGH: TACROLIMUS, TROUGH: 5.9 ng/mL (ref 5.0–15.0)

## 2023-03-12 LAB — IONIZED CALCIUM VENOUS: CALCIUM IONIZED VENOUS (MG/DL): 6.68 mg/dL (ref 4.40–5.40)

## 2023-03-12 LAB — PHOSPHORUS: PHOSPHORUS: 1.8 mg/dL — ABNORMAL LOW (ref 2.4–5.1)

## 2023-03-12 MED ADMIN — pantoprazole (Protonix) EC tablet 40 mg: 40 mg | ORAL | @ 14:00:00

## 2023-03-12 MED ADMIN — predniSONE (DELTASONE) tablet 40 mg: 40 mg | ORAL | @ 14:00:00 | Stop: 2023-03-14

## 2023-03-12 MED ADMIN — tacrolimus (PROGRAF) capsule 10 mg: 10 mg | ORAL | @ 15:00:00

## 2023-03-12 MED ADMIN — tacrolimus (PROGRAF) capsule 10 mg: 10 mg | ORAL | @ 02:00:00

## 2023-03-12 MED ADMIN — carvedilol (COREG) tablet 25 mg: 25 mg | ORAL | @ 02:00:00

## 2023-03-12 MED ADMIN — azathioprine (IMURAN) tablet 150 mg: 150 mg | ORAL | @ 02:00:00

## 2023-03-12 MED ADMIN — minoxidil (LONITEN) tablet 2.5 mg: 2.5 mg | ORAL | @ 14:00:00

## 2023-03-12 MED ADMIN — bictegrav-emtricit-tenofov ala (BIKTARVY) 50-200-25 mg tablet 1 tablet: 1 | ORAL | @ 14:00:00

## 2023-03-12 MED ADMIN — pantoprazole (Protonix) EC tablet 40 mg: 40 mg | ORAL | @ 02:00:00

## 2023-03-12 NOTE — Unmapped (Signed)
 UNOS form

## 2023-03-12 NOTE — Unmapped (Signed)
Nephrology ([facility]) Progress Note    Assessment & Plan:   Christian Brown is a 43 y.o. 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 with pneumonia and AKI.      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)    HIV nephropathy (CMS-HCC)    Chronic deep vein thrombosis (DVT) of femoral vein of left lower extremity (CMS-HCC)    CAP (community acquired pneumonia)    Mild intermittent asthma without complication    Pulmonary hypertension (CMS-HCC)    Iron deficiency anemia    History of DVT (deep vein thrombosis)    Anemia due to stage 4 chronic kidney disease (CMS-HCC)    Secondary renal hyperparathyroidism (CMS-HCC)    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 However with unknown cause for AKI, a renal biopsy of transplant kidney was completed on 1/7. HLA DSA reassuringly negative. 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 A few non scared area have mild tubulitis not associated with CaPO4 deposits so cannot excluded concurrent TCMR. Thus decision was made to treat as acute transplant rejection with high dose steroids. Cr continues to downtrend likely due to resolving ATN iso acute on chronic tubulointerstitial injury as above.   - s/p Solumedrol 250 mg x 2 doses (1/10 - 1/11)   - Prednisone taper starting on 1/12:    Pred 40 mg (1/13, 1/14) - Pred 20 mg (1/15, 1/16) - Pred 10 mg (1/17) - Pred 5 mg (1/18)   - daily RFP  - Final Renal Transplant surgical pathology pending   - continue home immunosuppression with tacrolimus 10mg  PO BID, azathioprine 105mg  PO daily (initially on MMF, but stopped 2/2 pill esophagitis/gastitis)  - HOLD home prednisone 5 mg iso of Solumedrol and Pred taper     C/f Multifocal Pneumonia - Hx of PJP Pneumonia - Productive Cough (resolved)   One month of shortness of breath along with productive cough. 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 Completed therapy for HCAP with levofloxacin. Patient continues to refuse atovaquone and understands the risk associated with refusing the medication. Reassuringly without productive cough. Reassuringly, PJP PCR negative on 1/9.   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 Planning for upcoming parathyroid gland removal on 1/15 for possible hyperactive parathyroid gland. Patient with persistent, but relatively stable serum Ca & ionized  Ca. This is likely related to patient's known parathyroid disease. Considered cinacalet for him as well, but endorsed having an adverse reaction to it. Patient to remain inpatient prior to surgery with Dr. Selena Batten until 1/15 for planned parathyroidectomy.   - daily BMP+Mg+Phos + ionized Ca   - Has good PO fluid intake, will defer IV fluids at this time.      HTN  Noted that Goal BP is <130/80. BP WNL although slightly elevated thus resumed home minoxidil 2.5mg  daily.   - Carvidilol 25mg  BID  - Minoxidil 2.5mg  daily      Chronic Problems  Anemia of Chronic Kidney Disease : Baseline Hgb 12-13, 11.9 on admission. Monitoring with daily CBC. B12, folate w/n limits. Monitoring with Daily CBC.   HIV: CD4 of 75 thought to be due thymoglobulin + immunosuppression. Reassuringly, HIV viral load <20. Continue home Biktarvy 1 tablet PO daily   Mild Intermittent Asthma - OSA: Uses albuterol inhaler as needed at home. Diagnosed with severe obstructive sleep apnea following 10/21/2019 sleep study. Does not use a CPAP at night. Stable on room air since admission. Continue albuterol PRN    Issues Impacting Complexity of Management:  -High risk of complications from pain and/or analgesia likely to result in delirium  -The patient is at high risk for the development of complications of volume overload due to the need to provide IV hydration for suspected hypovolemia in the setting of: stage IV or greater CKD  -The patient is at high risk of complications from hypercalcemia, infection in immunocompromised state    Daily Checklist:  Diet: Regular Diet  DVT PPx: Heparin 5000units q8h  Electrolytes: Replete Potassium to >/= 3.6 and Magnesium to >/= 1.8  Code Status: Full Code  Dispo:  Continue plan of care on floor    Team Contact Information:   Primary Team: Nephrology ([facility])  Primary Resident: Christian Rhymes, MD  Resident's Pager: [phone] (Nephrology Intern - Tower)    Interval History:   No acute events overnight.    Was resting comfortably in bed when visting this AM, voices to complaints. Requesting work note.     Objective:   Temp:  [36.7 ??C (98.1 ??F)] 36.7 ??C (98.1 ??F)  Heart Rate:  [70-108] 108  Resp:  [18] 18  BP: (112-138)/(67-94) 117/90  SpO2:  [100 %] 100 %    Gen: NAD, converses appropriately  HENT: atraumatic, normocephalic.   Heart: RRR, no murmurs.   Lungs: CTAB, no crackles or wheezes  Abdomen: soft, NTND  Extremities: fistula clotted but present in LUE, thrill felt from previous fistula of left lower forearm present, mild swelling in right anterior arm around PIV site      Christian Rhymes, MD  Internal Medicine PGY 1

## 2023-03-12 NOTE — Unmapped (Signed)
Nephrology ([facility]) Progress Note    Assessment & Plan:   Christian Brown is a 43 y.o. 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)    HIV nephropathy (CMS-HCC)    Chronic deep vein thrombosis (DVT) of femoral vein of left lower extremity (CMS-HCC)    CAP (community acquired pneumonia)    Mild intermittent asthma without complication    Pulmonary hypertension (CMS-HCC)    Iron deficiency anemia    History of DVT (deep vein thrombosis)    Anemia due to stage 4 chronic kidney disease (CMS-HCC)    Secondary renal hyperparathyroidism (CMS-HCC)    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 However with unknown cause for AKI, a renal biopsy of transplant kidney was completed on 1/7. HLA DSA reassuringly negative. 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 A few non scared area have mild tubulitis not associated with CaPO4 deposits so cannot excluded concurrent TCMR. Thus decision was made to pursue Solumedrol x 2 doses. After completion, patient will begin prednisone taper as below.   - Solumedrol 250 mg x 2 doses (1/10 - 1/11)   - Prednisone taper starting on 1/12:    Prednisone 60 mg (1/12) -> Pred 40 mg (1/13, 1/14) - Pred 20 mg (1/15, 1/16) - Pred 10 mg (1/17) - Pred 5 mg (1/18)   - daily RFP  - Final Renal Transplant surgical pathology pending   - continue home immunosuppression with tacrolimus 10mg  PO BID, azathioprine 105mg  PO daily (initially on MMF, but stopped 2/2 pill esophagitis/gastitis)  - HOLD home prednisone 5 mg iso of Solumedrol and Pred taper     C/f Multifocal Pneumonia - Hx of PJP Pneumonia - Productive Cough (resolved)   One month of shortness of breath along with productive cough. 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 Completed therapy for HCAP with levofloxacin. Patient continues to refuse atovaquone and understands the risk associated with refusing the medication. Reassuringly without productive cough. Reassuringly, PJP PCR negative on 1/9.   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 Planning for upcoming parathyroid gland removal on 1/15 for possible hyperactive parathyroid gland. Patient with persistent, but relatively stable serum Ca & ionized Ca. This is likely related to patient's known  parathyroid disease. Considered cinacalet for him as well, but endorsed having an adverse reaction to it. Patient to remain inpatient prior to surgery with Dr. Selena Batten until 1/15 for planned parathyroidectomy.   - daily BMP+Mg+Phos + ionized Ca   - Has good PO fluid intake, will defer IV fluids on 1/12     HTN  Noted that Goal BP is <130/80. BP WNL although slightly elevated thus resumed home minoxidil 2.5mg  daily.   - Carvidilol 25mg  BID  - Minoxidil 2.5mg  daily      Chronic Problems  Anemia of Chronic Kidney Disease : Baseline Hgb 12-13, 11.9 on admission. Monitoring with daily CBC. B12, folate w/n limits. Monitoring with Daily CBC.   HIV: CD4 of 75 thought to be due thymoglobulin + immunosuppression. Reassuringly, HIV viral load <20. Continue home Biktarvy 1 tablet PO daily     Mild Intermittent Asthma - OSA: Uses albuterol inhaler as needed at home. Diagnosed with severe obstructive sleep apnea following 10/21/2019 sleep study. Does not use a CPAP at night. Stable on room air since admission. Continue albuterol PRN    Issues Impacting Complexity of Management:  -High risk of complications from pain and/or analgesia likely to result in delirium  -The patient is at high risk for the development of complications of volume overload due to the need to provide IV hydration for suspected hypovolemia in the setting of: stage IV or greater CKD  -The patient is at high risk of complications from hypercalcemia, infection in immunocompromised state    Daily Checklist:  Diet: Regular Diet  DVT PPx: Heparin 5000units q8h  Electrolytes: Replete Potassium to >/= 3.6 and Magnesium to >/= 1.8  Code Status: Full Code  Dispo:  Continue plan of care on floor    Team Contact Information:   Primary Team: Nephrology ([facility])  Primary Resident: Windle Guard, MD  Resident's Pager: [phone] (Nephrology Intern - Tower)    Interval History:   No acute events overnight.    Was resting comfortably in bed when visting this AM, voices to complaints.     Objective:   Temp:  [36.6 ??C (97.9 ??F)] 36.6 ??C (97.9 ??F)  Heart Rate:  [68-103] 96  Resp:  [17-18] 18  BP: (112-142)/(67-95) 112/67  SpO2:  [97 %-100 %] 97 %    Gen: NAD, converses appropriately  HENT: atraumatic, normocephalic.   Heart: RRR, no murmurs.   Lungs: CTAB, no crackles or wheezes  Abdomen: soft, NTND  Extremities: fistula clotted but present in LUE, thrill felt from previous fistula of left lower forearm present, mild swelling in right anterior arm around PIV site

## 2023-03-12 NOTE — Unmapped (Signed)
Pt alert and oriented. Denies pain. No prns given. Refused heparin. Pt on RA. Save left arm. Tolerated meds well. Free from falls. Bed low and locked. Call bell and side table within reach.     Problem: Adult Inpatient Plan of Care  Goal: Plan of Care Review  Outcome: Progressing  Goal: Patient-Specific Goal (Individualized)  Outcome: Progressing  Goal: Absence of Hospital-Acquired Illness or Injury  Outcome: Progressing  Intervention: Identify and Manage Fall Risk  Recent Flowsheet Documentation  Taken 03/11/2023 2102 by Barnett Applebaum' A, RN  Safety Interventions:   fall reduction program maintained   lighting adjusted for tasks/safety   low bed   nonskid shoes/slippers when out of bed  Intervention: Prevent Infection  Recent Flowsheet Documentation  Taken 03/11/2023 2102 by Barnett Applebaum' A, RN  Infection Prevention:   equipment surfaces disinfected   hand hygiene promoted   rest/sleep promoted   single patient room provided  Goal: Optimal Comfort and Wellbeing  Outcome: Progressing  Goal: Readiness for Transition of Care  Outcome: Progressing  Goal: Rounds/Family Conference  Outcome: Progressing     Problem: Infection  Goal: Absence of Infection Signs and Symptoms  Outcome: Progressing  Intervention: Prevent or Manage Infection  Recent Flowsheet Documentation  Taken 03/11/2023 2102 by Barnett Applebaum' A, RN  Infection Management: aseptic technique maintained     Problem: HIV/AIDS Infection  Goal: HIV/AIDS Symptom Control  Outcome: Progressing  Intervention: Prevent Infection and Maximize Resistance  Recent Flowsheet Documentation  Taken 03/11/2023 2102 by Barnett Applebaum' A, RN  Infection Prevention:   equipment surfaces disinfected   hand hygiene promoted   rest/sleep promoted   single patient room provided     Problem: Wound  Goal: Optimal Coping  Outcome: Progressing  Goal: Optimal Functional Ability  Outcome: Progressing  Intervention: Optimize Functional Ability  Recent Flowsheet Documentation  Taken 03/11/2023 2102 by Barnett Applebaum' A, RN  Activity Management: up ad lib  Goal: Absence of Infection Signs and Symptoms  Outcome: Progressing  Intervention: Prevent or Manage Infection  Recent Flowsheet Documentation  Taken 03/11/2023 2102 by Barnett Applebaum' A, RN  Infection Management: aseptic technique maintained  Goal: Improved Oral Intake  Outcome: Progressing  Goal: Optimal Pain Control and Function  Outcome: Progressing  Goal: Skin Health and Integrity  Outcome: Progressing  Intervention: Optimize Skin Protection  Recent Flowsheet Documentation  Taken 03/11/2023 2102 by Barnett Applebaum' A, RN  Activity Management: up ad lib  Head of Bed (HOB) Positioning: HOB lowered  Goal: Optimal Wound Healing  Outcome: Progressing     Problem: Acute Kidney Injury/Impairment  Goal: Fluid and Electrolyte Balance  Outcome: Progressing  Goal: Improved Oral Intake  Outcome: Progressing  Goal: Effective Renal Function  Outcome: Progressing     Problem: Chronic Kidney Disease  Goal: Optimal Coping with Chronic Illness  Outcome: Progressing  Goal: Electrolyte Balance  Outcome: Progressing  Goal: Fluid Balance  Outcome: Progressing  Goal: Optimal Functional Ability  Outcome: Progressing  Intervention: Optimize Functional Ability  Recent Flowsheet Documentation  Taken 03/11/2023 2102 by Barnett Applebaum' A, RN  Activity Management: up ad lib  Goal: Absence of Anemia Signs and Symptoms  Outcome: Progressing  Intervention: Manage Signs of Anemia and Bleeding  Recent Flowsheet Documentation  Taken 03/11/2023 2102 by Barnett Applebaum' A, RN  Bleeding Precautions:   blood pressure closely monitored   monitored for signs of bleeding  Goal: Optimal Oral Intake  Outcome: Progressing  Goal: Acceptable Pain Control  Outcome: Progressing  Goal: Minimize Renal Failure Effects  Outcome: Progressing     Problem: Airway Clearance Ineffective  Goal: Effective Airway Clearance  Outcome: Progressing  Intervention: Promote Airway Secretion Clearance  Recent Flowsheet Documentation  Taken 03/11/2023 2102 by Barnett Applebaum' A, RN  Activity Management: up ad lib     Problem: Pain Acute  Goal: Optimal Pain Control and Function  Outcome: Progressing     Problem: Comorbidity Management  Goal: Blood Glucose Levels Within Targeted Range  Outcome: Progressing

## 2023-03-13 LAB — CBC
HEMATOCRIT: 35.5 % — ABNORMAL LOW (ref 39.0–48.0)
HEMOGLOBIN: 12.2 g/dL — ABNORMAL LOW (ref 12.9–16.5)
MEAN CORPUSCULAR HEMOGLOBIN CONC: 34.3 g/dL (ref 32.0–36.0)
MEAN CORPUSCULAR HEMOGLOBIN: 35.9 pg — ABNORMAL HIGH (ref 25.9–32.4)
MEAN CORPUSCULAR VOLUME: 104.6 fL — ABNORMAL HIGH (ref 77.6–95.7)
MEAN PLATELET VOLUME: 8.8 fL (ref 6.8–10.7)
PLATELET COUNT: 132 10*9/L — ABNORMAL LOW (ref 150–450)
RED BLOOD CELL COUNT: 3.4 10*12/L — ABNORMAL LOW (ref 4.26–5.60)
RED CELL DISTRIBUTION WIDTH: 14.9 % (ref 12.2–15.2)
WBC ADJUSTED: 3.3 10*9/L — ABNORMAL LOW (ref 3.6–11.2)

## 2023-03-13 LAB — BASIC METABOLIC PANEL
ANION GAP: 10 mmol/L (ref 5–14)
BLOOD UREA NITROGEN: 26 mg/dL — ABNORMAL HIGH (ref 9–23)
BUN / CREAT RATIO: 16
CALCIUM: 12.1 mg/dL (ref 8.7–10.4)
CHLORIDE: 107 mmol/L (ref 98–107)
CO2: 22 mmol/L (ref 20.0–31.0)
CREATININE: 1.65 mg/dL — ABNORMAL HIGH (ref 0.73–1.18)
EGFR CKD-EPI (2021) MALE: 54 mL/min/{1.73_m2} — ABNORMAL LOW (ref >=60)
GLUCOSE RANDOM: 182 mg/dL — ABNORMAL HIGH (ref 70–179)
POTASSIUM: 4.3 mmol/L (ref 3.4–4.8)
SODIUM: 139 mmol/L (ref 135–145)

## 2023-03-13 LAB — MAGNESIUM: MAGNESIUM: 1.1 mg/dL — ABNORMAL LOW (ref 1.6–2.6)

## 2023-03-13 LAB — PHOSPHORUS: PHOSPHORUS: 1.6 mg/dL — ABNORMAL LOW (ref 2.4–5.1)

## 2023-03-13 LAB — IONIZED CALCIUM VENOUS: CALCIUM IONIZED VENOUS (MG/DL): 6.85 mg/dL (ref 4.40–5.40)

## 2023-03-13 LAB — TACROLIMUS LEVEL, TROUGH: TACROLIMUS, TROUGH: 9.7 ng/mL (ref 5.0–15.0)

## 2023-03-13 MED ADMIN — tacrolimus (PROGRAF) capsule 10 mg: 10 mg | ORAL | @ 14:00:00

## 2023-03-13 MED ADMIN — bictegrav-emtricit-tenofov ala (BIKTARVY) 50-200-25 mg tablet 1 tablet: 1 | ORAL | @ 14:00:00

## 2023-03-13 MED ADMIN — pantoprazole (Protonix) EC tablet 40 mg: 40 mg | ORAL | @ 14:00:00

## 2023-03-13 MED ADMIN — carvedilol (COREG) tablet 25 mg: 25 mg | ORAL | @ 14:00:00

## 2023-03-13 MED ADMIN — pantoprazole (Protonix) EC tablet 40 mg: 40 mg | ORAL | @ 03:00:00

## 2023-03-13 MED ADMIN — predniSONE (DELTASONE) tablet 40 mg: 40 mg | ORAL | @ 14:00:00 | Stop: 2023-03-13

## 2023-03-13 MED ADMIN — azathioprine (IMURAN) tablet 150 mg: 150 mg | ORAL | @ 03:00:00

## 2023-03-13 MED ADMIN — carvedilol (COREG) tablet 25 mg: 25 mg | ORAL | @ 03:00:00

## 2023-03-13 MED ADMIN — tacrolimus (PROGRAF) capsule 10 mg: 10 mg | ORAL | @ 03:00:00

## 2023-03-13 NOTE — Unmapped (Signed)
Nephrology ([facility]) Progress Note    Assessment & Plan:   Christian Brown is a 43 y.o. 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)    HIV nephropathy (CMS-HCC)    Chronic deep vein thrombosis (DVT) of femoral vein of left lower extremity (CMS-HCC)    CAP (community acquired pneumonia)    Mild intermittent asthma without complication    Pulmonary hypertension (CMS-HCC)    Iron deficiency anemia    History of DVT (deep vein thrombosis)    Anemia due to stage 4 chronic kidney disease (CMS-HCC)    Secondary renal hyperparathyroidism (CMS-HCC)    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 However with unknown cause for AKI, a renal biopsy of transplant kidney was completed on 1/7. HLA DSA reassuringly negative. 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 A few non scared area have mild tubulitis not associated with CaPO4 deposits so cannot excluded concurrent TCMR. Thus decision was made to treat as acute transplant rejection with high dose steroids. Cr continues to downtrend likely due to resolving ATN iso acute on chronic tubulointerstitial injury as above.   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 Planning for upcoming parathyroid gland removal on 1/15 for possible hyperactive parathyroid gland. Patient with persistent, but relatively stable serum Ca & ionized Ca. This is likely related to patient's known parathyroid disease. Considered cinacalet for him as well, but endorsed having an adverse reaction to it. Patient to remain inpatient prior to surgery with Dr. Selena Batten until 1/15 for planned parathyroidectomy.   - daily BMP+Mg+Phos + ionized Ca   - Has good PO fluid intake, will defer IV fluids at this time.      HTN  Noted that Goal BP is <130/80. BP WNL with current regimen as below.   - Carvidilol 25mg  BID  - Minoxidil 2.5mg  daily     C/f Multifocal  Pneumonia - Hx of PJP Pneumonia - Productive Cough (resolved)   One month of shortness of breath along with productive cough. 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 Completed therapy for HCAP with levofloxacin. Patient continues to refuse atovaquone and understands the risk associated with refusing the medication. Reassuringly without productive cough. Reassuringly, PJP PCR negative on 1/9.   - ICID consulted appreciate recs  - atovaquone for PJP prophyalxis (has allergy to bactrim w/ rash, and pentamidine causeD bronchospasm, and dapsone caused cytopenias)   - fungitell assay,galactomannan ordered     Chronic Problems  Anemia of Chronic Kidney Disease : Baseline Hgb 12-13, 11.9 on admission. Monitoring with daily CBC. B12, folate w/n limits. Monitoring with Daily CBC.   HIV: CD4 of 75 thought to be due thymoglobulin + immunosuppression. Reassuringly, HIV viral load <20. Continue home Biktarvy 1 tablet PO daily   Mild Intermittent Asthma - OSA: Uses albuterol inhaler as needed at home. Diagnosed with severe obstructive sleep apnea following 10/21/2019 sleep study. Does not use a CPAP at night. Stable on room air since admission. Continue albuterol PRN    Issues Impacting Complexity of Management:  -High risk of complications from pain and/or analgesia likely to result in delirium  -The patient is at high risk for the development of complications of volume overload due to the need to provide IV hydration for suspected hypovolemia in the setting of: stage IV or greater CKD  -The patient is at high risk of complications from hypercalcemia, infection in immunocompromised state    Daily Checklist:  Diet: Regular Diet  DVT PPx: Heparin 5000units q8h  Electrolytes: Replete Potassium to >/= 3.6 and Magnesium to >/= 1.8  Code Status: Full Code  Dispo:  Continue plan of care on floor    Team Contact Information:   Primary Team: Nephrology ([facility])  Primary Resident: Seleta Rhymes, MD  Resident's Pager: [phone] (Nephrology Intern - Tower)    Interval History:   No acute events overnight. Laying in bed. No acute complaints.       Objective:   Temp:  [36.2 ??C (97.2 ??F)-36.7 ??C (98.1 ??F)] 36.7 ??C (98.1 ??F)  Heart Rate:  [91-97] 91  Resp:  [18] 18  BP: (134-142)/(76-85) 142/76  SpO2:  [100 %] 100 %    Gen: NAD, converses appropriately  HENT: atraumatic, normocephalic.   Heart: RRR, no murmurs.   Lungs: CTAB, no crackles or wheezes  Abdomen: soft, NTND  Extremities: fistula clotted but present in LUE, thrill felt from previous fistula of left lower forearm present, mild swelling in right anterior arm around PIV site      Seleta Rhymes, MD  Internal Medicine PGY 1

## 2023-03-13 NOTE — Unmapped (Signed)
Patient is alert &oriented X4. Vital signs checked.  Due meds given.  Waiting for parathyroidectomy on 1/15. Bed in low position with call bell within reach.      Problem: Adult Inpatient Plan of Care  Goal: Plan of Care Review  Outcome: Progressing  Goal: Patient-Specific Goal (Individualized)  Outcome: Progressing  Goal: Absence of Hospital-Acquired Illness or Injury  Outcome: Progressing  Intervention: Identify and Manage Fall Risk  Recent Flowsheet Documentation  Taken 03/12/2023 0800 by Lyndee Leo, RN  Safety Interventions:   fall reduction program maintained   lighting adjusted for tasks/safety   low bed  Goal: Optimal Comfort and Wellbeing  Outcome: Progressing  Goal: Readiness for Transition of Care  Outcome: Progressing  Goal: Rounds/Family Conference  Outcome: Progressing     Problem: Infection  Goal: Absence of Infection Signs and Symptoms  Outcome: Progressing     Problem: HIV/AIDS Infection  Goal: HIV/AIDS Symptom Control  Outcome: Progressing     Problem: Wound  Goal: Optimal Coping  Outcome: Progressing  Goal: Optimal Functional Ability  Outcome: Progressing  Goal: Absence of Infection Signs and Symptoms  Outcome: Progressing  Goal: Improved Oral Intake  Outcome: Progressing  Goal: Optimal Pain Control and Function  Outcome: Progressing  Goal: Skin Health and Integrity  Outcome: Progressing  Goal: Optimal Wound Healing  Outcome: Progressing     Problem: Acute Kidney Injury/Impairment  Goal: Fluid and Electrolyte Balance  Outcome: Progressing  Goal: Improved Oral Intake  Outcome: Progressing  Goal: Effective Renal Function  Outcome: Progressing     Problem: Chronic Kidney Disease  Goal: Optimal Coping with Chronic Illness  Outcome: Progressing  Goal: Electrolyte Balance  Outcome: Progressing  Goal: Fluid Balance  Outcome: Progressing  Goal: Optimal Functional Ability  Outcome: Progressing  Goal: Absence of Anemia Signs and Symptoms  Outcome: Progressing  Goal: Optimal Oral Intake  Outcome: Progressing  Goal: Acceptable Pain Control  Outcome: Progressing  Goal: Minimize Renal Failure Effects  Outcome: Progressing     Problem: Airway Clearance Ineffective  Goal: Effective Airway Clearance  Outcome: Progressing     Problem: Pain Acute  Goal: Optimal Pain Control and Function  Outcome: Progressing     Problem: Comorbidity Management  Goal: Blood Glucose Levels Within Targeted Range  Outcome: Progressing

## 2023-03-13 NOTE — Unmapped (Signed)
Pt remained free from falls and injury this shift. VSS, on RA. No c/o pain. A&Ox4. I&O monitored. Ambulates independently. Plan for parathyroidectomy on 1/15. Bed in low locked position, call bell in reach.   Problem: Adult Inpatient Plan of Care  Goal: Plan of Care Review  Outcome: Progressing  Goal: Patient-Specific Goal (Individualized)  Outcome: Progressing  Goal: Absence of Hospital-Acquired Illness or Injury  Outcome: Progressing  Intervention: Identify and Manage Fall Risk  Recent Flowsheet Documentation  Taken 03/12/2023 1922 by Candie Chroman, RN  Safety Interventions:   low bed   lighting adjusted for tasks/safety   fall reduction program maintained  Intervention: Prevent Skin Injury  Recent Flowsheet Documentation  Taken 03/12/2023 1922 by Candie Chroman, RN  Positioning for Skin: Supine/Back  Device Skin Pressure Protection: absorbent pad utilized/changed  Skin Protection: adhesive use limited  Intervention: Prevent and Manage VTE (Venous Thromboembolism) Risk  Recent Flowsheet Documentation  Taken 03/12/2023 2141 by Candie Chroman, RN  VTE Prevention/Management: ambulation promoted  Intervention: Prevent Infection  Recent Flowsheet Documentation  Taken 03/12/2023 1922 by Candie Chroman, RN  Infection Prevention: cohorting utilized  Goal: Optimal Comfort and Wellbeing  Outcome: Progressing  Goal: Readiness for Transition of Care  Outcome: Progressing  Goal: Rounds/Family Conference  Outcome: Progressing     Problem: Infection  Goal: Absence of Infection Signs and Symptoms  Outcome: Progressing  Intervention: Prevent or Manage Infection  Recent Flowsheet Documentation  Taken 03/12/2023 1922 by Candie Chroman, RN  Infection Management: aseptic technique maintained     Problem: HIV/AIDS Infection  Goal: HIV/AIDS Symptom Control  Outcome: Progressing  Intervention: Prevent Infection and Maximize Resistance  Recent Flowsheet Documentation  Taken 03/12/2023 1922 by Candie Chroman, RN  Infection Prevention: cohorting utilized     Problem: Wound  Goal: Optimal Coping  Outcome: Progressing  Goal: Optimal Functional Ability  Outcome: Progressing  Goal: Absence of Infection Signs and Symptoms  Outcome: Progressing  Intervention: Prevent or Manage Infection  Recent Flowsheet Documentation  Taken 03/12/2023 1922 by Candie Chroman, RN  Infection Management: aseptic technique maintained  Goal: Improved Oral Intake  Outcome: Progressing  Goal: Optimal Pain Control and Function  Outcome: Progressing  Goal: Skin Health and Integrity  Outcome: Progressing  Intervention: Optimize Skin Protection  Recent Flowsheet Documentation  Taken 03/12/2023 1922 by Candie Chroman, RN  Pressure Reduction Techniques: frequent weight shift encouraged  Pressure Reduction Devices: positioning supports utilized  Skin Protection: adhesive use limited  Goal: Optimal Wound Healing  Outcome: Progressing     Problem: Acute Kidney Injury/Impairment  Goal: Fluid and Electrolyte Balance  Outcome: Progressing  Goal: Improved Oral Intake  Outcome: Progressing  Goal: Effective Renal Function  Outcome: Progressing     Problem: Chronic Kidney Disease  Goal: Optimal Coping with Chronic Illness  Outcome: Progressing  Goal: Electrolyte Balance  Outcome: Progressing  Goal: Fluid Balance  Outcome: Progressing  Intervention: Monitor and Manage Hypervolemia  Recent Flowsheet Documentation  Taken 03/12/2023 1922 by Candie Chroman, RN  Skin Protection: adhesive use limited  Goal: Optimal Functional Ability  Outcome: Progressing  Goal: Absence of Anemia Signs and Symptoms  Outcome: Progressing  Intervention: Manage Signs of Anemia and Bleeding  Recent Flowsheet Documentation  Taken 03/12/2023 1922 by Candie Chroman, RN  Bleeding Precautions: blood pressure closely monitored  Goal: Optimal Oral Intake  Outcome: Progressing  Goal: Acceptable Pain Control  Outcome: Progressing  Goal: Minimize Renal Failure Effects  Outcome: Progressing  Problem: Airway Clearance Ineffective  Goal: Effective Airway Clearance  Outcome: Progressing     Problem: Pain Acute  Goal: Optimal Pain Control and Function  Outcome: Progressing     Problem: Comorbidity Management  Goal: Blood Glucose Levels Within Targeted Range  Outcome: Progressing

## 2023-03-14 LAB — MAGNESIUM
MAGNESIUM: 1 mg/dL — ABNORMAL LOW (ref 1.6–2.6)
MAGNESIUM: 1 mg/dL — ABNORMAL LOW (ref 1.6–2.6)

## 2023-03-14 LAB — CBC
HEMATOCRIT: 30.1 % — ABNORMAL LOW (ref 39.0–48.0)
HEMOGLOBIN: 11.1 g/dL — ABNORMAL LOW (ref 12.9–16.5)
MEAN CORPUSCULAR HEMOGLOBIN CONC: 36.8 g/dL — ABNORMAL HIGH (ref 32.0–36.0)
MEAN CORPUSCULAR HEMOGLOBIN: 38.2 pg — ABNORMAL HIGH (ref 25.9–32.4)
MEAN CORPUSCULAR VOLUME: 103.8 fL — ABNORMAL HIGH (ref 77.6–95.7)
MEAN PLATELET VOLUME: 8.7 fL (ref 6.8–10.7)
PLATELET COUNT: 126 10*9/L — ABNORMAL LOW (ref 150–450)
RED BLOOD CELL COUNT: 2.9 10*12/L — ABNORMAL LOW (ref 4.26–5.60)
RED CELL DISTRIBUTION WIDTH: 14.9 % (ref 12.2–15.2)
WBC ADJUSTED: 3.1 10*9/L — ABNORMAL LOW (ref 3.6–11.2)

## 2023-03-14 LAB — PTH, INTRAOPERATIVE
INTRAOPERATIVE PTH: 2092.7 pg/mL — ABNORMAL HIGH (ref 18.4–80.1)
INTRAOPERATIVE PTH: 149.9 pg/mL — ABNORMAL HIGH (ref 18.4–80.1)
INTRAOPERATIVE PTH: 85.8 pg/mL — ABNORMAL HIGH (ref 18.4–80.1)

## 2023-03-14 LAB — BASIC METABOLIC PANEL
ANION GAP: 8 mmol/L (ref 5–14)
BLOOD UREA NITROGEN: 28 mg/dL — ABNORMAL HIGH (ref 9–23)
BUN / CREAT RATIO: 17
CALCIUM: 11.8 mg/dL — ABNORMAL HIGH (ref 8.7–10.4)
CHLORIDE: 108 mmol/L — ABNORMAL HIGH (ref 98–107)
CO2: 22 mmol/L (ref 20.0–31.0)
CREATININE: 1.61 mg/dL — ABNORMAL HIGH (ref 0.73–1.18)
EGFR CKD-EPI (2021) MALE: 55 mL/min/{1.73_m2} — ABNORMAL LOW (ref >=60)
GLUCOSE RANDOM: 119 mg/dL (ref 70–179)
SODIUM: 138 mmol/L (ref 135–145)

## 2023-03-14 LAB — RENAL FUNCTION PANEL
ALBUMIN: 3.4 g/dL (ref 3.4–5.0)
ANION GAP: 10 mmol/L (ref 5–14)
BLOOD UREA NITROGEN: 30 mg/dL — ABNORMAL HIGH (ref 9–23)
BUN / CREAT RATIO: 16
CALCIUM: 11 mg/dL — ABNORMAL HIGH (ref 8.7–10.4)
CHLORIDE: 108 mmol/L — ABNORMAL HIGH (ref 98–107)
CO2: 20 mmol/L (ref 20.0–31.0)
CREATININE: 1.87 mg/dL — ABNORMAL HIGH (ref 0.73–1.18)
EGFR CKD-EPI (2021) MALE: 46 mL/min/{1.73_m2} — ABNORMAL LOW (ref >=60)
GLUCOSE RANDOM: 125 mg/dL (ref 70–179)
PHOSPHORUS: 2.2 mg/dL — ABNORMAL LOW (ref 2.4–5.1)
POTASSIUM: 4.8 mmol/L (ref 3.4–4.8)
SODIUM: 138 mmol/L (ref 135–145)

## 2023-03-14 LAB — POTASSIUM: POTASSIUM: 4.7 mmol/L (ref 3.4–4.8)

## 2023-03-14 LAB — IONIZED CALCIUM VENOUS
CALCIUM IONIZED VENOUS (MG/DL): 7.16 mg/dL (ref 4.40–5.40)
CALCIUM IONIZED VENOUS (MG/DL): 6.16 mg/dL — ABNORMAL HIGH (ref 4.40–5.40)

## 2023-03-14 LAB — TACROLIMUS LEVEL, TROUGH: TACROLIMUS, TROUGH: 11.1 ng/mL (ref 5.0–15.0)

## 2023-03-14 LAB — PHOSPHORUS: PHOSPHORUS: 1.4 mg/dL — ABNORMAL LOW (ref 2.4–5.1)

## 2023-03-14 MED ADMIN — fentaNYL (PF) (SUBLIMAZE) injection: INTRAVENOUS | @ 19:00:00 | Stop: 2023-03-14

## 2023-03-14 MED ADMIN — ondansetron (ZOFRAN) injection: INTRAVENOUS | @ 21:00:00 | Stop: 2023-03-14

## 2023-03-14 MED ADMIN — bupivacaine-EPINEPHrine (PF) (MARCAINE-PF w/EPI) 0.5 %-1:200,000 injection (PF): @ 21:00:00 | Stop: 2023-03-14

## 2023-03-14 MED ADMIN — HYDROmorphone (PF) (DILAUDID) injection Syrg 0.2 mg: .2 mg | INTRAVENOUS | @ 23:00:00 | Stop: 2023-03-14

## 2023-03-14 MED ADMIN — fentaNYL (PF) (SUBLIMAZE) injection 25 mcg: 25 ug | INTRAVENOUS | @ 22:00:00 | Stop: 2023-03-14

## 2023-03-14 MED ADMIN — predniSONE (DELTASONE) tablet 20 mg: 20 mg | ORAL | @ 13:00:00 | Stop: 2023-03-16

## 2023-03-14 MED ADMIN — oxyCODONE (ROXICODONE) immediate release tablet 5 mg: 5 mg | ORAL | @ 22:00:00 | Stop: 2023-03-14

## 2023-03-14 MED ADMIN — midazolam (VERSED) injection: INTRAVENOUS | @ 19:00:00 | Stop: 2023-03-14

## 2023-03-14 MED ADMIN — remifentanil (ULTIVA) 1,000 mcg in sodium chloride (NS) 20 mL: INTRAVENOUS | @ 19:00:00 | Stop: 2023-03-14

## 2023-03-14 MED ADMIN — sodium chloride irrigation (NS) 0.9 % irrigation solution: @ 21:00:00 | Stop: 2023-03-14

## 2023-03-14 MED ADMIN — azathioprine (IMURAN) tablet 150 mg: 150 mg | ORAL | @ 04:00:00

## 2023-03-14 MED ADMIN — carvedilol (COREG) tablet 25 mg: 25 mg | ORAL | @ 13:00:00

## 2023-03-14 MED ADMIN — succinylcholine (ANECTINE) injection: INTRAVENOUS | @ 19:00:00 | Stop: 2023-03-14

## 2023-03-14 MED ADMIN — bictegrav-emtricit-tenofov ala (BIKTARVY) 50-200-25 mg tablet 1 tablet: 1 | ORAL | @ 13:00:00

## 2023-03-14 MED ADMIN — phenylephrine 1 mg/10 mL (100 mcg/mL) injection Syrg: INTRAVENOUS | @ 21:00:00 | Stop: 2023-03-14

## 2023-03-14 MED ADMIN — tacrolimus (PROGRAF) capsule 10 mg: 10 mg | ORAL | @ 13:00:00 | Stop: 2023-03-14

## 2023-03-14 MED ADMIN — pantoprazole (Protonix) EC tablet 40 mg: 40 mg | ORAL | @ 13:00:00

## 2023-03-14 MED ADMIN — phenylephrine 1 mg/10 mL (100 mcg/mL) injection Syrg: INTRAVENOUS | @ 20:00:00 | Stop: 2023-03-14

## 2023-03-14 MED ADMIN — pantoprazole (Protonix) EC tablet 40 mg: 40 mg | ORAL | @ 04:00:00

## 2023-03-14 MED ADMIN — Propofol (DIPRIVAN) injection: INTRAVENOUS | @ 19:00:00 | Stop: 2023-03-14

## 2023-03-14 MED ADMIN — phenylephrine 20 mg in sodium chloride 0.9% 250 mL (80 mcg/mL) infusion PMB: INTRAVENOUS | @ 20:00:00 | Stop: 2023-03-14

## 2023-03-14 MED ADMIN — lidocaine (PF) (XYLOCAINE-MPF) 20 mg/mL (2 %) injection: INTRAVENOUS | @ 19:00:00 | Stop: 2023-03-14

## 2023-03-14 MED ADMIN — HYDROmorphone (PF) (DILAUDID) injection Syrg 0.2 mg: .2 mg | INTRAVENOUS | @ 22:00:00 | Stop: 2023-03-14

## 2023-03-14 MED ADMIN — tacrolimus (PROGRAF) capsule 10 mg: 10 mg | ORAL | @ 04:00:00

## 2023-03-14 MED ADMIN — propofol (DIPRIVAN) infusion 10 mg/mL: INTRAVENOUS | @ 19:00:00 | Stop: 2023-03-14

## 2023-03-14 MED ADMIN — lactated Ringers infusion: INTRAVENOUS | @ 19:00:00 | Stop: 2023-03-14

## 2023-03-14 MED ADMIN — minoxidil (LONITEN) tablet 2.5 mg: 2.5 mg | ORAL | @ 13:00:00

## 2023-03-14 MED ADMIN — acetaminophen (TYLENOL) tablet 1,000 mg: 1000 mg | ORAL | @ 23:00:00 | Stop: 2023-03-14

## 2023-03-14 NOTE — Unmapped (Signed)
Nephrology ([facility]) Progress Note    Assessment & Plan:   Christian Brown is a 43 y.o. 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)    HIV nephropathy (CMS-HCC)    Chronic deep vein thrombosis (DVT) of femoral vein of left lower extremity (CMS-HCC)    CAP (community acquired pneumonia)    Mild intermittent asthma without complication    Pulmonary hypertension (CMS-HCC)    Iron deficiency anemia    History of DVT (deep vein thrombosis)    Anemia due to stage 4 chronic kidney disease (CMS-HCC)    Secondary renal hyperparathyroidism (CMS-HCC)    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 However with unknown cause for AKI, a renal biopsy of transplant kidney was completed on 1/7. HLA DSA reassuringly negative. 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 A few non scared area have mild tubulitis not associated with CaPO4 deposits so cannot excluded concurrent TCMR. Thus decision was made to treat as acute transplant rejection with high dose steroids. Cr continues to downtrend likely due to resolving ATN iso acute on chronic tubulointerstitial injury as above.   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 Planning for upcoming parathyroid gland removal on 1/15 for possible hyperactive parathyroid gland. Patient with persistent, but relatively stable serum Ca & ionized Ca. This is likely related to patient's known parathyroid disease. Considered cinacalet for him as well, but endorsed having an adverse reaction to it. Planning for parathyroidectomy with Dr. Selena Batten on 1/15.   - daily BMP+Mg+Phos + ionized Ca   - Has good PO fluid intake, will defer IV fluids at this time.      HTN  Noted that Goal BP is <130/80. BP WNL with current regimen as below.   - Carvidilol 25mg  BID  - Minoxidil 2.5mg  daily     C/f Multifocal Pneumonia - Hx of PJP Pneumonia - Productive Cough (resolved)  One month of shortness of breath along with productive cough. 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 Completed therapy for HCAP with levofloxacin. Patient continues to refuse atovaquone and understands the risk associated with refusing the medication. Reassuringly without productive cough. Reassuringly, PJP PCR negative on 1/9.   - ICID consulted appreciate recs  - atovaquone for PJP prophyalxis (has allergy to bactrim w/ rash, and pentamidine causeD bronchospasm, and dapsone caused cytopenias)   - fungitell assay,galactomannan ordered     Chronic Problems  Anemia of Chronic Kidney Disease : Baseline Hgb 12-13, 11.9 on admission. Monitoring with daily CBC. B12, folate w/n limits. Monitoring with Daily CBC.   HIV: CD4 of 75 thought to be due thymoglobulin + immunosuppression. Reassuringly, HIV viral load <20. Continue home Biktarvy 1 tablet PO daily   Mild Intermittent Asthma - OSA: Uses albuterol inhaler as needed at home. Diagnosed with severe obstructive sleep apnea following 10/21/2019 sleep study. Does not use a CPAP at night. Stable on room air since admission. Continue albuterol PRN    Issues Impacting Complexity of Management:  -High risk of complications from pain and/or analgesia likely to result in delirium  -The patient is at high risk for the development of complications of volume overload due to the need to provide IV hydration for suspected hypovolemia in the setting of: stage IV or greater CKD  -The patient is at high risk of complications from hypercalcemia, infection in immunocompromised state    Daily Checklist:  Diet: Regular Diet  DVT PPx: Heparin 5000units q8h  Electrolytes: Replete Potassium to >/= 3.6 and Magnesium to >/= 1.8  Code Status: Full Code  Dispo:  Continue plan of care on floor    Team Contact Information:   Primary Team: Nephrology ([facility])  Primary Resident: Seleta Rhymes, MD  Resident's Pager: [phone] (Nephrology Intern - Tower)    Interval History:   No acute events overnight. Laying in bed. No acute complaints this AM.       Objective:   Temp:  [36.5 ??C (97.7 ??F)-36.7 ??C (98.1 ??F)] 36.5 ??C (97.7 ??F)  Heart Rate:  [86-100] 86  Resp:  [18-19] 19  BP: (118-142)/(81-104) 118/81  SpO2:  [98 %-99 %] 98 %    Gen: NAD, converses appropriately  HENT: atraumatic, normocephalic.   Heart: RRR, no murmurs.   Lungs: CTAB, no crackles or wheezes  Abdomen: soft, NTND  Extremities: fistula clotted but present in LUE, thrill felt from previous fistula of left lower forearm present, mild swelling in right anterior arm around PIV site      Seleta Rhymes, MD  Internal Medicine PGY 1

## 2023-03-14 NOTE — Unmapped (Signed)
Tacrolimus Therapeutic Monitoring Pharmacy Note    Semisi Hardey is a 43 y.o. male continuing tacrolimus.     Indication: Kidney transplant     Date of Transplant:  09/05/22       Prior Dosing Information: Home regimen tacrolimus 10 mg BID      Source(s) of information used to determine prior to admission dosing: Clinic Note    Goals:  Therapeutic Drug Levels  Tacrolimus trough goal:  6-8 ng/ml    Additional Clinical Monitoring/Outcomes  Monitor renal function (SCr and urine output) and liver function (LFTs)  Monitor for signs/symptoms of adverse events (e.g., hyperglycemia, hyperkalemia, hypomagnesemia, hypertension, headache, tremor)    Results:   Tacrolimus level:  11.1 ng/mL, drawn ~2 hours early    Pharmacokinetic Considerations and Significant Drug Interactions:  Concurrent hepatotoxic medications: None identified  Concurrent CYP3A4 substrates/inhibitors: None identified  Concurrent nephrotoxic medications: None identified    Assessment/Plan:  Recommendedation(s)  Decrease to tacrolimus 10 mg QAM + 9 mg QPM    Follow-up  Daily levels with AM labs .   A pharmacist will continue to monitor and recommend levels as appropriate    Please page service pharmacist with questions/clarifications.    [place] M Sidni Fusco, PharmD

## 2023-03-14 NOTE — Unmapped (Signed)
Brief Pre-operative History & Physical    Patient name: Christian Brown  CSN: [id number]  MRN: [id number]  Admit Date: 03/01/2023  Date of Surgery: 03/14/2023  Performing Service: Surgical Oncology    Code Status: Full Code      Assessment/Plan:      Christian Brown is a 43 y.o. 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     Consent obtained in office is accurate. Risks, benefits, and alternatives to surgery were reviewed, and all questions were answered.    Proceed to the OR as planned.         History of Present Illness:    Christian Brown is a 43 y.o. 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 He was recently seen in clinic, where a detailed HPI can be found. He was noted to benefit from:  Procedure(s) (LRB):  PARATHYROIDECTOMY OR EXPLORATION OF PARATHYROID(S) (N/A).       Allergies  Amlodipine, Doxercalciferol, Fish containing products, Iron sucrose, Labetalol, Metoclopramide, Metoprolol, Nifedipine, Other, Sulfamethoxazole-trimethoprim, Vancomycin, Calcitonin, Peanut oil, Potassium clavulanate, Azathioprine, Clindamycin, Hydralazine, and Sensipar [cinacalcet]    Medications    Current Facility-Administered Medications   Medication Dose Route Frequency Provider Last Rate Last Admin    acetaminophen (TYLENOL) tablet 650 mg  650 mg Oral Q4H PRN Onokalah, Chinonyerem A, MD   650 mg at 03/06/23 2019    albuterol (PROVENTIL HFA;VENTOLIN HFA) 90 mcg/actuation inhaler 1-2 puff  1-2 puff Inhalation Q6H PRN [place], Chinonyerem A, MD        atovaquone (MEPRON) oral suspension  1,500 mg Oral Daily Julious Payer, MD   1,500 mg at 03/04/23 0901    azathioprine (IMURAN) tablet 150 mg  150 mg Oral Nightly Windle Guard, MD   150 mg at 03/13/23 2235    bictegrav-emtricit-tenofov ala (BIKTARVY) 50-200-25 mg tablet 1 tablet  1 tablet Oral Q AM On[place]alah, Chinonyerem A, MD   1 tablet at 03/14/23 0752    carvedilol (COREG) tablet 25 mg  25 mg Oral BID Julious Payer, MD   25 mg at 03/14/23 6213    senna (SENOKOT) tablet 1 tablet  1 tablet Oral BID PRN On[place]alah, Chinonyerem A, MD        And    docusate sodium (COLACE) capsule 100 mg  100 mg Oral BID PRN On[place]alah, Chinonyerem A, MD        lidocaine (XYLOCAINE) 10 mg/mL (1 %) injection 10 mL  10 mL Intradermal Once Denu-Ciocca, Lenore Manner, MD        melatonin tablet 3 mg  3 mg Oral Nightly PRN On[place]alah, Chinonyerem A, MD        minoxidil (LONITEN) tablet 2.5 mg  2.5 mg Oral Daily Seleta Rhymes, MD   2.5 mg at 03/14/23 0751    pantoprazole (Protonix) EC tablet 40 mg  40 mg Oral BID On[place]alah, Chinonyerem A, MD   40 mg at 03/14/23 0752    predniSONE (DELTASONE) tablet 20 mg  20 mg Oral Daily Seleta Rhymes, MD   20 mg at 03/14/23 0865    Followed by    Melene Muller ON 03/16/2023] predniSONE (DELTASONE) tablet 10 mg  10 mg Oral Daily Seleta Rhymes, MD        Followed by    Melene Muller ON 03/17/2023] predniSONE (DELTASONE) tablet 5 mg  5 mg Oral Daily Seleta Rhymes, MD        [Provider Hold] predniSONE (DELTASONE) tablet 5 mg  5  mg Oral Daily On[place]ah, Chinonyerem A, MD   5 mg at 03/09/23 0828    tacrolimus (PROGRAF) capsule 10 mg  10 mg Oral BID Windle Guard, MD   10 mg at 03/14/23 0753       Vital Signs  BP 142/104  - Pulse 89  - Temp 36.7 ??C (98.1 ??F) (Oral)  - Resp 18  - Ht 170.2 cm (5' 7.01)  - Wt 73.2 kg (161 lb 6 oz)  - SpO2 99%  - BMI 25.27 kg/m??   Facility age limit for growth %iles is 20 years.  Facility age limit for growth %iles is 20 years..     Physical Exam  General: Well developed, appears stated age, in no acute distress  Mental status: Alert and oriented x3  Cardiovascular: Normal  Pulmonary: Symmetric chest rise, unlabored breathing  Relevant System for Surgery: Surgical site exam deferred to the OR    Labs and Studies:  Lab Results   Component Value Date    WBC 3.1 (L) 03/14/2023    HGB 11.1 (L) 03/14/2023    HCT 30.1 (L) 03/14/2023    PLT 126 (L) 03/14/2023       Lab Results   Component Value Date    PT 12.7 (H) 03/06/2023    INR 1.11 03/06/2023    APTT 31.5 03/06/2023

## 2023-03-14 NOTE — Unmapped (Signed)
Pt remained free from falls and injury this shift. VSS, on RA. No c/o pain. Ambulates independently. A&Ox4. NPO for parathyroidectomy this shift. Bed in low locked position, call bell in reach.   Problem: Adult Inpatient Plan of Care  Goal: Plan of Care Review  Outcome: Progressing  Goal: Patient-Specific Goal (Individualized)  Outcome: Progressing  Goal: Absence of Hospital-Acquired Illness or Injury  Outcome: Progressing  Intervention: Identify and Manage Fall Risk  Recent Flowsheet Documentation  Taken 03/13/2023 1941 by Candie Chroman, RN  Safety Interventions:   low bed   lighting adjusted for tasks/safety   fall reduction program maintained  Intervention: Prevent Skin Injury  Recent Flowsheet Documentation  Taken 03/13/2023 1941 by Candie Chroman, RN  Positioning for Skin: Supine/Back  Device Skin Pressure Protection: absorbent pad utilized/changed  Skin Protection: adhesive use limited  Intervention: Prevent Infection  Recent Flowsheet Documentation  Taken 03/13/2023 1941 by Candie Chroman, RN  Infection Prevention: cohorting utilized  Goal: Optimal Comfort and Wellbeing  Outcome: Progressing  Goal: Readiness for Transition of Care  Outcome: Progressing  Goal: Rounds/Family Conference  Outcome: Progressing     Problem: Infection  Goal: Absence of Infection Signs and Symptoms  Outcome: Progressing  Intervention: Prevent or Manage Infection  Recent Flowsheet Documentation  Taken 03/13/2023 1941 by Candie Chroman, RN  Infection Management: aseptic technique maintained     Problem: HIV/AIDS Infection  Goal: HIV/AIDS Symptom Control  Outcome: Progressing  Intervention: Prevent Infection and Maximize Resistance  Recent Flowsheet Documentation  Taken 03/13/2023 1941 by Candie Chroman, RN  Infection Prevention: cohorting utilized     Problem: Wound  Goal: Optimal Coping  Outcome: Progressing  Goal: Optimal Functional Ability  Outcome: Progressing  Goal: Absence of Infection Signs and Symptoms  Outcome: Progressing  Intervention: Prevent or Manage Infection  Recent Flowsheet Documentation  Taken 03/13/2023 1941 by Candie Chroman, RN  Infection Management: aseptic technique maintained  Goal: Improved Oral Intake  Outcome: Progressing  Goal: Optimal Pain Control and Function  Outcome: Progressing  Goal: Skin Health and Integrity  Outcome: Progressing  Intervention: Optimize Skin Protection  Recent Flowsheet Documentation  Taken 03/13/2023 1941 by Candie Chroman, RN  Pressure Reduction Techniques: frequent weight shift encouraged  Pressure Reduction Devices: positioning supports utilized  Skin Protection: adhesive use limited  Goal: Optimal Wound Healing  Outcome: Progressing     Problem: Acute Kidney Injury/Impairment  Goal: Fluid and Electrolyte Balance  Outcome: Progressing  Goal: Improved Oral Intake  Outcome: Progressing  Goal: Effective Renal Function  Outcome: Progressing     Problem: Chronic Kidney Disease  Goal: Optimal Coping with Chronic Illness  Outcome: Progressing  Goal: Electrolyte Balance  Outcome: Progressing  Goal: Fluid Balance  Outcome: Progressing  Intervention: Monitor and Manage Hypervolemia  Recent Flowsheet Documentation  Taken 03/13/2023 1941 by Candie Chroman, RN  Skin Protection: adhesive use limited  Goal: Optimal Functional Ability  Outcome: Progressing  Goal: Absence of Anemia Signs and Symptoms  Outcome: Progressing  Intervention: Manage Signs of Anemia and Bleeding  Recent Flowsheet Documentation  Taken 03/13/2023 1941 by Candie Chroman, RN  Bleeding Precautions: blood pressure closely monitored  Goal: Optimal Oral Intake  Outcome: Progressing  Goal: Acceptable Pain Control  Outcome: Progressing  Goal: Minimize Renal Failure Effects  Outcome: Progressing     Problem: Airway Clearance Ineffective  Goal: Effective Airway Clearance  Outcome: Progressing

## 2023-03-14 NOTE — Unmapped (Signed)
Patient A&Ox4,VSS. Patient denied pain, shortness of breath, nausea and vomiting. Pt ambulates in room. He has been free from falls, call bell and bedside table within reach. He will be NPO at midnight for OR.  Problem: Adult Inpatient Plan of Care  Goal: Plan of Care Review  Outcome: Ongoing - Unchanged  Goal: Patient-Specific Goal (Individualized)  Outcome: Ongoing - Unchanged  Goal: Absence of Hospital-Acquired Illness or Injury  Outcome: Ongoing - Unchanged  Goal: Optimal Comfort and Wellbeing  Outcome: Ongoing - Unchanged  Goal: Readiness for Transition of Care  Outcome: Ongoing - Unchanged  Goal: Rounds/Family Conference  Outcome: Ongoing - Unchanged     Problem: Infection  Goal: Absence of Infection Signs and Symptoms  Outcome: Ongoing - Unchanged     Problem: HIV/AIDS Infection  Goal: HIV/AIDS Symptom Control  Outcome: Ongoing - Unchanged     Problem: Wound  Goal: Optimal Coping  Outcome: Ongoing - Unchanged  Goal: Optimal Functional Ability  Outcome: Ongoing - Unchanged  Goal: Absence of Infection Signs and Symptoms  Outcome: Ongoing - Unchanged  Goal: Improved Oral Intake  Outcome: Ongoing - Unchanged  Goal: Optimal Pain Control and Function  Outcome: Ongoing - Unchanged  Goal: Skin Health and Integrity  Outcome: Ongoing - Unchanged  Goal: Optimal Wound Healing  Outcome: Ongoing - Unchanged     Problem: Acute Kidney Injury/Impairment  Goal: Fluid and Electrolyte Balance  Outcome: Ongoing - Unchanged  Goal: Improved Oral Intake  Outcome: Ongoing - Unchanged  Goal: Effective Renal Function  Outcome: Ongoing - Unchanged     Problem: Chronic Kidney Disease  Goal: Optimal Coping with Chronic Illness  Outcome: Ongoing - Unchanged  Goal: Electrolyte Balance  Outcome: Ongoing - Unchanged  Goal: Fluid Balance  Outcome: Ongoing - Unchanged  Goal: Optimal Functional Ability  Outcome: Ongoing - Unchanged  Goal: Absence of Anemia Signs and Symptoms  Outcome: Ongoing - Unchanged  Goal: Optimal Oral Intake  Outcome: Ongoing - Unchanged  Goal: Acceptable Pain Control  Outcome: Ongoing - Unchanged  Goal: Minimize Renal Failure Effects  Outcome: Ongoing - Unchanged     Problem: Airway Clearance Ineffective  Goal: Effective Airway Clearance  Outcome: Ongoing - Unchanged

## 2023-03-15 LAB — CBC
HEMATOCRIT: 31 % — ABNORMAL LOW (ref 39.0–48.0)
HEMOGLOBIN: 10.6 g/dL — ABNORMAL LOW (ref 12.9–16.5)
MEAN CORPUSCULAR HEMOGLOBIN CONC: 34.1 g/dL (ref 32.0–36.0)
MEAN CORPUSCULAR HEMOGLOBIN: 35.8 pg — ABNORMAL HIGH (ref 25.9–32.4)
MEAN CORPUSCULAR VOLUME: 104.9 fL — ABNORMAL HIGH (ref 77.6–95.7)
MEAN PLATELET VOLUME: 9.1 fL (ref 6.8–10.7)
PLATELET COUNT: 121 10*9/L — ABNORMAL LOW (ref 150–450)
RED BLOOD CELL COUNT: 2.96 10*12/L — ABNORMAL LOW (ref 4.26–5.60)
RED CELL DISTRIBUTION WIDTH: 15.1 % (ref 12.2–15.2)
WBC ADJUSTED: 3.5 10*9/L — ABNORMAL LOW (ref 3.6–11.2)

## 2023-03-15 LAB — TACROLIMUS LEVEL, TROUGH: TACROLIMUS, TROUGH: 5.2 ng/mL (ref 5.0–15.0)

## 2023-03-15 LAB — RENAL FUNCTION PANEL
ALBUMIN: 3.1 g/dL — ABNORMAL LOW (ref 3.4–5.0)
ALBUMIN: 3.3 g/dL — ABNORMAL LOW (ref 3.4–5.0)
ANION GAP: 3 mmol/L — ABNORMAL LOW (ref 5–14)
ANION GAP: 6 mmol/L (ref 5–14)
BLOOD UREA NITROGEN: 38 mg/dL — ABNORMAL HIGH (ref 9–23)
BLOOD UREA NITROGEN: 35 mg/dL — ABNORMAL HIGH (ref 9–23)
BUN / CREAT RATIO: 16
BUN / CREAT RATIO: 15
CALCIUM: 8.9 mg/dL (ref 8.7–10.4)
CALCIUM: 8 mg/dL — ABNORMAL LOW (ref 8.7–10.4)
CHLORIDE: 110 mmol/L — ABNORMAL HIGH (ref 98–107)
CHLORIDE: 110 mmol/L — ABNORMAL HIGH (ref 98–107)
CO2: 21 mmol/L (ref 20.0–31.0)
CO2: 19 mmol/L — ABNORMAL LOW (ref 20.0–31.0)
CREATININE: 2.38 mg/dL — ABNORMAL HIGH (ref 0.73–1.18)
CREATININE: 2.4 mg/dL — ABNORMAL HIGH (ref 0.73–1.18)
EGFR CKD-EPI (2021) MALE: 34 mL/min/{1.73_m2} — ABNORMAL LOW (ref >=60)
EGFR CKD-EPI (2021) MALE: 34 mL/min/{1.73_m2} — ABNORMAL LOW (ref >=60)
GLUCOSE RANDOM: 179 mg/dL (ref 70–179)
GLUCOSE RANDOM: 164 mg/dL (ref 70–179)
PHOSPHORUS: 3.6 mg/dL (ref 2.4–5.1)
PHOSPHORUS: 2.2 mg/dL — ABNORMAL LOW (ref 2.4–5.1)
POTASSIUM: 4.8 mmol/L (ref 3.4–4.8)
POTASSIUM: 5 mmol/L — ABNORMAL HIGH (ref 3.4–4.8)
SODIUM: 134 mmol/L — ABNORMAL LOW (ref 135–145)
SODIUM: 135 mmol/L (ref 135–145)

## 2023-03-15 LAB — MAGNESIUM
MAGNESIUM: 2.4 mg/dL (ref 1.6–2.6)
MAGNESIUM: 1.7 mg/dL (ref 1.6–2.6)
MAGNESIUM: 1.4 mg/dL — ABNORMAL LOW (ref 1.6–2.6)

## 2023-03-15 LAB — IONIZED CALCIUM VENOUS
CALCIUM IONIZED VENOUS (MG/DL): 5.17 mg/dL (ref 4.40–5.40)
CALCIUM IONIZED VENOUS (MG/DL): 4.56 mg/dL (ref 4.40–5.40)
CALCIUM IONIZED VENOUS (MG/DL): 4.66 mg/dL (ref 4.40–5.40)

## 2023-03-15 LAB — PARATHYROID HORMONE (PTH): PARATHYROID HORMONE INTACT: 78.2 pg/mL (ref 18.4–80.1)

## 2023-03-15 MED ADMIN — oxyCODONE (ROXICODONE) immediate release tablet 15 mg: 15 mg | ORAL | @ 14:00:00 | Stop: 2023-03-28

## 2023-03-15 MED ADMIN — acetaminophen (TYLENOL) tablet 1,000 mg: 1000 mg | ORAL | @ 18:00:00

## 2023-03-15 MED ADMIN — sodium phosphate 18 mmol in dextrose 5 % 250 mL IVPB: 18 mmol | INTRAVENOUS | @ 09:00:00 | Stop: 2023-03-15

## 2023-03-15 MED ADMIN — HYDROmorphone (PF) (DILAUDID) injection 1 mg: 1 mg | INTRAVENOUS | @ 07:00:00 | Stop: 2023-03-15

## 2023-03-15 MED ADMIN — magnesium sulfate 2gm/50mL IVPB: 2 g | INTRAVENOUS | @ 05:00:00

## 2023-03-15 MED ADMIN — pantoprazole (Protonix) EC tablet 40 mg: 40 mg | ORAL | @ 14:00:00

## 2023-03-15 MED ADMIN — pantoprazole (Protonix) EC tablet 40 mg: 40 mg | ORAL | @ 02:00:00

## 2023-03-15 MED ADMIN — HYDROmorphone (PF) (DILAUDID) injection 1 mg: 1 mg | INTRAVENOUS | @ 11:00:00 | Stop: 2023-03-15

## 2023-03-15 MED ADMIN — oxyCODONE (ROXICODONE) immediate release tablet 10 mg: 10 mg | ORAL | @ 03:00:00 | Stop: 2023-03-28

## 2023-03-15 MED ADMIN — minoxidil (LONITEN) tablet 2.5 mg: 2.5 mg | ORAL | @ 14:00:00

## 2023-03-15 MED ADMIN — tacrolimus (PROGRAF) 9 mg combo product: 9 mg | ORAL | @ 02:00:00

## 2023-03-15 MED ADMIN — HYDROmorphone (PF) (DILAUDID) injection 1 mg: 1 mg | INTRAVENOUS | @ 16:00:00 | Stop: 2023-03-15

## 2023-03-15 MED ADMIN — azathioprine (IMURAN) tablet 150 mg: 150 mg | ORAL | @ 02:00:00

## 2023-03-15 MED ADMIN — predniSONE (DELTASONE) tablet 20 mg: 20 mg | ORAL | @ 14:00:00 | Stop: 2023-03-15

## 2023-03-15 MED ADMIN — oxyCODONE (ROXICODONE) immediate release tablet 15 mg: 15 mg | ORAL | @ 23:00:00 | Stop: 2023-03-28

## 2023-03-15 MED ADMIN — HYDROmorphone (PF) (DILAUDID) injection Syrg 0.5 mg: .5 mg | INTRAVENOUS | @ 05:00:00 | Stop: 2023-03-15

## 2023-03-15 MED ADMIN — oxyCODONE (ROXICODONE) immediate release tablet 5 mg: 5 mg | ORAL | @ 02:00:00 | Stop: 2023-03-14

## 2023-03-15 MED ADMIN — oxyCODONE (ROXICODONE) immediate release tablet 15 mg: 15 mg | ORAL | @ 18:00:00 | Stop: 2023-03-28

## 2023-03-15 MED ADMIN — magnesium sulfate 2gm/50mL IVPB: 2 g | INTRAVENOUS | @ 07:00:00 | Stop: 2023-03-15

## 2023-03-15 MED ADMIN — oxyCODONE (ROXICODONE) immediate release tablet 15 mg: 15 mg | ORAL | @ 09:00:00 | Stop: 2023-03-28

## 2023-03-15 MED ADMIN — bictegrav-emtricit-tenofov ala (BIKTARVY) 50-200-25 mg tablet 1 tablet: 1 | ORAL | @ 14:00:00

## 2023-03-15 MED ADMIN — tacrolimus (PROGRAF) capsule 10 mg: 10 mg | ORAL | @ 14:00:00

## 2023-03-15 NOTE — Unmapped (Signed)
Pt went to OR for parathyroidectomy. Report  called from OR and said there is no precaution regarding swallowing and food intake.

## 2023-03-15 NOTE — Unmapped (Signed)
Preoperative Diagnosis: Tertiary Hyperparathyroidism     Postoperative Diagnosis: Same    Procedure(s) Performed: Right Inferior Parathyroidectomy with parathyroid tissue cryopreservation     Attending Surgeon: Truitt Merle, MD     Resident Surgeon: Margart Sickles, MD     Anesthesia: General endotracheal.     Specimens:  Parathyroid, right inferior, parathyroid tissue minced and sent for cryopreservation     Estimated Blood Loss: <20 ml     Complications: None.     Indications for Surgery: This is a 44 yo male  with hx of ESRD s/p DDKT in 07/24 who underwent a right and left superior and partial left inferior parathyroidectomy without the ability of identifying the right inferior parathyroid who is presenting with recurrent/tertiary hyperparathyroidism with [place] of large right inferior parathyroid gland. All risk and benefits were discussed with the patient who provided written consent.    Operative Findings: Significant scarring encountered when trying to find the plan between the strap muscles and the thyroid gland. Right large parathyroid gland was identified and removed. A small kernel sized parathyroid tissue was cut into minced pieces and was sent to the blood bank for cryopreservation.     Procedure: The patient was identified in the holding area. He was brought back to the operating room and placed supine on the operating room table. A timeout was performed and general anesthesia was successfully achieved. 0.5% bupivicaine with epinephrine was placed in the dermis along the anticipated incision within a skin crease of the neck. The arms were tucked and the patient's neck was prepped and draped in the usual sterile fashion. A small transverse curvilinear Kocher incision was made in the skin and subcutaneous tissue using a scalpel. Dissection was carried down through the platysma muscle and superior and inferior flaps were then created. The strap muscles were then opened in the midline. When trying to elevate the strap muscles off the thyroid gland, we encountered significant scar tissue without clear planes separating the muscle from the thyroid gland which required significant and meticulous dissection and lysis of scar tissue. However, we were able to elevate the muscles down to the carotid artery without issues. We used intra-operative nerve monitoring to look for a nerve stimulus whenever we were concerned about a structure that resembled a nerve before any lysis as identifying the nerve should be deeper than where the identified inferior parathyroid gland in the setting of significant scarring. After further dissection the target right inferior parathyroid gland was identified.  The dissection around the gland was tedious but we were able to carefully separate the gland from the surrounding tissue. The vascular pedicle was identified and ligated with a clip and then divided. Some remaining loose areolar tissue was dissected away and the specimen was removed. The specimen measured 2.2 x 1.8 x 2.0 cm dimension. Post-operatively his parathyroid hormone levels fell appropriately.     The wound was carefully inspected for hemostasis.  A Valsalva maneuver was performed as we examined for bleeding. Hemostasis was deemed adequate.  The strap muscles were closed in the midline using 2-0 Vicryl suture and the platysma muscle was closed using 3-0 Vicryl. The skin was closed using intradermal absorbable suture. Dressings were then applied. The patient was awakened without complication and taken to the PACU in stable condition.     Attestation: Dr. Selena Batten was present and scrubbed for the entire procedure.

## 2023-03-15 NOTE — Unmapped (Addendum)
Pt remained free from falls and injury this shift. VSS, on RA. Pt returned from parathyroidectomy this shift. Pt experienced significant pain at surgical site, MD notified and PRNs and ice packs given w/ minimal effect. Magnesium replacement given and sodium phosphate infusing currently. Ambulates independently. Bed in low locked position, call bell in reach.   Problem: Adult Inpatient Plan of Care  Goal: Plan of Care Review  Outcome: Progressing  Goal: Patient-Specific Goal (Individualized)  Outcome: Progressing  Goal: Absence of Hospital-Acquired Illness or Injury  Outcome: Progressing  Intervention: Identify and Manage Fall Risk  Recent Flowsheet Documentation  Taken 03/14/2023 1943 by Candie Chroman, RN  Safety Interventions:   low bed   lighting adjusted for tasks/safety   fall reduction program maintained  Intervention: Prevent Skin Injury  Recent Flowsheet Documentation  Taken 03/14/2023 1943 by Candie Chroman, RN  Positioning for Skin: Supine/Back  Device Skin Pressure Protection: absorbent pad utilized/changed  Skin Protection: adhesive use limited  Intervention: Prevent Infection  Recent Flowsheet Documentation  Taken 03/14/2023 1943 by Candie Chroman, RN  Infection Prevention: cohorting utilized  Goal: Optimal Comfort and Wellbeing  Outcome: Progressing  Goal: Readiness for Transition of Care  Outcome: Progressing  Goal: Rounds/Family Conference  Outcome: Progressing     Problem: Infection  Goal: Absence of Infection Signs and Symptoms  Outcome: Progressing  Intervention: Prevent or Manage Infection  Recent Flowsheet Documentation  Taken 03/14/2023 1943 by Candie Chroman, RN  Infection Management: aseptic technique maintained     Problem: HIV/AIDS Infection  Goal: HIV/AIDS Symptom Control  Outcome: Progressing  Intervention: Prevent Infection and Maximize Resistance  Recent Flowsheet Documentation  Taken 03/14/2023 1943 by Candie Chroman, RN  Infection Prevention: cohorting utilized     Problem: Wound  Goal: Optimal Coping  Outcome: Progressing  Goal: Optimal Functional Ability  Outcome: Progressing  Goal: Absence of Infection Signs and Symptoms  Outcome: Progressing  Intervention: Prevent or Manage Infection  Recent Flowsheet Documentation  Taken 03/14/2023 1943 by Candie Chroman, RN  Infection Management: aseptic technique maintained  Goal: Improved Oral Intake  Outcome: Progressing  Goal: Optimal Pain Control and Function  Outcome: Progressing  Goal: Skin Health and Integrity  Outcome: Progressing  Intervention: Optimize Skin Protection  Recent Flowsheet Documentation  Taken 03/14/2023 1943 by Candie Chroman, RN  Pressure Reduction Techniques: frequent weight shift encouraged  Pressure Reduction Devices: positioning supports utilized  Skin Protection: adhesive use limited  Goal: Optimal Wound Healing  Outcome: Progressing     Problem: Acute Kidney Injury/Impairment  Goal: Fluid and Electrolyte Balance  Outcome: Progressing  Goal: Improved Oral Intake  Outcome: Progressing  Goal: Effective Renal Function  Outcome: Progressing     Problem: Chronic Kidney Disease  Goal: Optimal Coping with Chronic Illness  Outcome: Progressing  Goal: Electrolyte Balance  Outcome: Progressing  Goal: Fluid Balance  Outcome: Progressing  Intervention: Monitor and Manage Hypervolemia  Recent Flowsheet Documentation  Taken 03/14/2023 1943 by Candie Chroman, RN  Skin Protection: adhesive use limited  Goal: Optimal Functional Ability  Outcome: Progressing  Goal: Absence of Anemia Signs and Symptoms  Outcome: Progressing  Intervention: Manage Signs of Anemia and Bleeding  Recent Flowsheet Documentation  Taken 03/14/2023 1943 by Candie Chroman, RN  Bleeding Precautions: blood pressure closely monitored  Goal: Optimal Oral Intake  Outcome: Progressing  Goal: Acceptable Pain Control  Outcome: Progressing  Goal: Minimize Renal Failure Effects  Outcome: Progressing     Problem: Airway Clearance Ineffective  Goal:  Effective Airway Clearance  Outcome: Progressing     Problem: Fall Injury Risk  Goal: Absence of Fall and Fall-Related Injury  Outcome: Progressing  Intervention: Promote Injury-Free Environment  Recent Flowsheet Documentation  Taken 03/14/2023 1943 by Candie Chroman, RN  Safety Interventions:   low bed   lighting adjusted for tasks/safety   fall reduction program maintained

## 2023-03-15 NOTE — Unmapped (Signed)
Nephrology ([facility]) Progress Note    Assessment & Plan:   Christian Brown is a 43 y.o. 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)    HIV nephropathy (CMS-HCC)    Chronic deep vein thrombosis (DVT) of femoral vein of left lower extremity (CMS-HCC)    CAP (community acquired pneumonia)    Mild intermittent asthma without complication    Pulmonary hypertension (CMS-HCC)    Iron deficiency anemia    History of DVT (deep vein thrombosis)    Anemia due to stage 4 chronic kidney disease (CMS-HCC)    Secondary renal hyperparathyroidism (CMS-HCC)    Hypercalcemia due to chronic kidney disease    Hemodialysis AV fistula thrombosis (CMS-HCC)      Active Problems  Hypercalcemia - Secondary vs Tertiary Renal Hyperparathyroidism  Patient with elevated PTH, s/p 3 parathyroid glands removed. Patient with persistently elevated, but relatively stable serum Ca & ionized Ca. Underwent parathyroidectomy with Dr. Selena Batten on 1/15. Overnight with some dec Mg but was repleted and without appreciable electrolyte abnormalities this AM. PTH and ionized Ca WNL. Will continue to monitor.   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 However with unknown cause for AKI, a renal biopsy of transplant kidney was completed on 1/7. HLA DSA reassuringly negative. 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 A few non scared area have mild tubulitis not associated with CaPO4 deposits so cannot excluded concurrent TCMR. Thus decision was made to treat as acute transplant rejection with high dose steroids. Cr continues to downtrend likely due to resolving ATN iso acute on chronic tubulointerstitial injury as above.   - s/p Solumedrol 250 mg x 2 doses (1/10 - 1/11)   - Prednisone taper starting on 1/12:     Pred 20 mg (1/15, 1/16) - Pred 10 mg (1/17) - Pred 5 mg (1/18)   - daily RFP  - Final Renal Transplant surgical pathology pending   - continue home immunosuppression with tacrolimus 10mg  PO BID, azathioprine 150 mg PO daily (initially on MMF, but stopped 2/2 pill esophagitis/gastitis)  - HOLD home prednisone 5 mg iso of Solumedrol and Pred taper     HTN  Noted that Goal BP is <130/80. BP WNL with current regimen as below.   - Carvidilol 25mg  BID  - Minoxidil 2.5mg  daily     C/f Multifocal Pneumonia - Hx of PJP Pneumonia - Productive Cough (resolved)   One month of shortness of breath along with productive cough. History of HIV and PJP (diagnosed in September  2015). Completed therapy for HCAP with levofloxacin. Patient continues to refuse atovaquone and understands the risk associated with refusing the medication. Reassuringly without productive cough. Reassuringly, PJP PCR negative on 1/9.   - ICID consulted appreciate recs  - atovaquone for PJP prophyalxis (has allergy to bactrim w/ rash, and pentamidine causeD bronchospasm, and dapsone caused cytopenias)   - fungitell assay,galactomannan ordered     Chronic Problems  Anemia of Chronic Kidney Disease : Baseline Hgb 12-13, 11.9 on admission. Monitoring with daily CBC. B12, folate w/n limits. Monitoring with Daily CBC.   HIV: CD4 of 75 thought to be due thymoglobulin + immunosuppression. Reassuringly, HIV viral load <20. Continue home Biktarvy 1 tablet PO daily   Mild Intermittent Asthma - OSA: Uses albuterol inhaler as needed at home. Diagnosed with severe obstructive sleep apnea following 10/21/2019 sleep study. Does not use a CPAP at night. Stable on room air since admission. Continue albuterol PRN    Issues Impacting Complexity of Management:  -High risk of complications from pain and/or analgesia likely to result in delirium  -The patient is at high risk for the development of complications of volume overload due to the need to provide IV hydration for suspected hypovolemia in the setting of: stage IV or greater CKD  -The patient is at high risk of complications from hypercalcemia, infection in immunocompromised state    Daily Checklist:  Diet: Regular Diet  DVT PPx: Heparin 5000units q8h  Electrolytes: Replete Potassium to >/= 3.6 and Magnesium to >/= 1.8  Code Status: Full Code  Dispo:  Continue plan of care on floor    Team Contact Information:   Primary Team: Nephrology ([facility])  Primary Resident: Seleta Rhymes, MD  Resident's Pager: [phone] (Nephrology Intern - Tower)    Interval History:   No acute events overnight. Laying in bed with ice pack on neck. Only complaint is that he has pain in neck.     Objective:   Temp:  [36.5 ??C (97.7 ??F)-36.9 ??C (98.4 ??F)] 36.5 ??C (97.7 ??F)  Heart Rate:  [84-92] 85  SpO2 Pulse:  [84-92] 84  Resp:  [10-20] 20  BP: (96-141)/(65-96) 116/80  SpO2:  [98 %-100 %] 98 %    Gen: NAD, converses appropriately  HENT: atraumatic, normocephalic.  Heart: RRR, no murmurs.   Lungs: CTAB, no crackles or wheezes  Abdomen: soft, NTND  Extremities: fistula clotted but present in LUE, thrill felt from previous fistula of left lower forearm present, mild swelling in right anterior arm around PIV site      Seleta Rhymes, MD  Internal Medicine PGY 1

## 2023-03-16 DIAGNOSIS — Z940 Kidney transplant status: Principal | ICD-10-CM

## 2023-03-16 LAB — TACROLIMUS LEVEL, TROUGH: TACROLIMUS, TROUGH: 4.2 ng/mL — ABNORMAL LOW (ref 5.0–15.0)

## 2023-03-16 LAB — CBC
HEMATOCRIT: 28.8 % — ABNORMAL LOW (ref 39.0–48.0)
HEMOGLOBIN: 10.1 g/dL — ABNORMAL LOW (ref 12.9–16.5)
MEAN CORPUSCULAR HEMOGLOBIN CONC: 35 g/dL (ref 32.0–36.0)
MEAN CORPUSCULAR HEMOGLOBIN: 36.4 pg — ABNORMAL HIGH (ref 25.9–32.4)
MEAN CORPUSCULAR VOLUME: 103.9 fL — ABNORMAL HIGH (ref 77.6–95.7)
MEAN PLATELET VOLUME: 8.5 fL (ref 6.8–10.7)
PLATELET COUNT: 113 10*9/L — ABNORMAL LOW (ref 150–450)
RED BLOOD CELL COUNT: 2.77 10*12/L — ABNORMAL LOW (ref 4.26–5.60)
RED CELL DISTRIBUTION WIDTH: 15.3 % — ABNORMAL HIGH (ref 12.2–15.2)
WBC ADJUSTED: 2.2 10*9/L — ABNORMAL LOW (ref 3.6–11.2)

## 2023-03-16 LAB — RENAL FUNCTION PANEL
ALBUMIN: 2.9 g/dL — ABNORMAL LOW (ref 3.4–5.0)
ANION GAP: 12 mmol/L (ref 5–14)
BLOOD UREA NITROGEN: 40 mg/dL — ABNORMAL HIGH (ref 9–23)
BUN / CREAT RATIO: 17
CALCIUM: 7.5 mg/dL — ABNORMAL LOW (ref 8.7–10.4)
CHLORIDE: 107 mmol/L (ref 98–107)
CO2: 21 mmol/L (ref 20.0–31.0)
CREATININE: 2.37 mg/dL — ABNORMAL HIGH (ref 0.73–1.18)
EGFR CKD-EPI (2021) MALE: 35 mL/min/{1.73_m2} — ABNORMAL LOW (ref >=60)
GLUCOSE RANDOM: 161 mg/dL (ref 70–179)
PHOSPHORUS: 3.4 mg/dL (ref 2.4–5.1)
POTASSIUM: 4.3 mmol/L (ref 3.4–4.8)
SODIUM: 140 mmol/L (ref 135–145)

## 2023-03-16 LAB — MAGNESIUM: MAGNESIUM: 2.3 mg/dL (ref 1.6–2.6)

## 2023-03-16 LAB — IONIZED CALCIUM VENOUS
CALCIUM IONIZED VENOUS (MG/DL): 4.35 mg/dL — ABNORMAL LOW (ref 4.40–5.40)
CALCIUM IONIZED VENOUS (MG/DL): 4.33 mg/dL — ABNORMAL LOW (ref 4.40–5.40)

## 2023-03-16 MED ORDER — CARVEDILOL 12.5 MG TABLET
ORAL_TABLET | Freq: Two times a day (BID) | ORAL | 0 refills | 30.00 days | Status: CP
Start: 2023-03-16 — End: 2023-04-15
  Filled 2023-03-16: qty 120, 30d supply, fill #0

## 2023-03-16 MED ORDER — CALCITRIOL 0.5 MCG CAPSULE
ORAL_CAPSULE | Freq: Every day | ORAL | 2 refills | 15.00 days | Status: CP
Start: 2023-03-16 — End: 2024-03-15

## 2023-03-16 MED ORDER — CALCIUM 200 MG (AS CALCIUM CARBONATE 500 MG) CHEWABLE TABLET
ORAL_TABLET | Freq: Two times a day (BID) | ORAL | 3 refills | 30.00 days | Status: CP
Start: 2023-03-16 — End: 2023-07-14

## 2023-03-16 MED ORDER — OXYCODONE 10 MG TABLET
ORAL_TABLET | Freq: Four times a day (QID) | ORAL | 0 refills | 5.00 days | Status: CP | PRN
Start: 2023-03-16 — End: 2023-03-21
  Filled 2023-03-16: qty 20, 5d supply, fill #0

## 2023-03-16 MED ORDER — TACROLIMUS 1 MG CAPSULE, IMMEDIATE-RELEASE
ORAL_CAPSULE | 11 refills | 0.00 days | Status: CP
Start: 2023-03-16 — End: 2023-03-16
  Filled 2023-03-16: qty 300, 16d supply, fill #0

## 2023-03-16 MED ORDER — SENNOSIDES 8.6 MG TABLET
ORAL_TABLET | Freq: Every day | ORAL | 0 refills | 30.00 days | Status: CP
Start: 2023-03-16 — End: 2023-04-15

## 2023-03-16 MED ORDER — AZATHIOPRINE 50 MG TABLET
ORAL_TABLET | Freq: Every day | ORAL | 11 refills | 30.00 days | Status: CN
Start: 2023-03-16 — End: 2024-03-15

## 2023-03-16 MED ADMIN — bictegrav-emtricit-tenofov ala (BIKTARVY) 50-200-25 mg tablet 1 tablet: 1 | ORAL | @ 14:00:00 | Stop: 2023-03-16

## 2023-03-16 MED ADMIN — tacrolimus (PROGRAF) capsule 10 mg: 10 mg | ORAL | @ 14:00:00 | Stop: 2023-03-16

## 2023-03-16 MED ADMIN — predniSONE (DELTASONE) tablet 10 mg: 10 mg | ORAL | @ 14:00:00 | Stop: 2023-03-16

## 2023-03-16 MED ADMIN — carvedilol (COREG) tablet 25 mg: 25 mg | ORAL | @ 14:00:00 | Stop: 2023-03-16

## 2023-03-16 MED ADMIN — magnesium sulfate 2gm/50mL IVPB: 2 g | INTRAVENOUS | @ 08:00:00 | Stop: 2023-03-16

## 2023-03-16 MED ADMIN — acetaminophen (TYLENOL) tablet 1,000 mg: 1000 mg | ORAL | @ 02:00:00

## 2023-03-16 MED ADMIN — pantoprazole (Protonix) EC tablet 40 mg: 40 mg | ORAL | @ 14:00:00 | Stop: 2023-03-16

## 2023-03-16 MED ADMIN — tacrolimus (PROGRAF) 9 mg combo product: 9 mg | ORAL | @ 02:00:00

## 2023-03-16 MED ADMIN — magnesium sulfate 2gm/50mL IVPB: 2 g | INTRAVENOUS | @ 06:00:00 | Stop: 2023-03-16

## 2023-03-16 MED ADMIN — calcium carbonate (TUMS) chewable tablet 400 mg elem calcium: 400 mg | ORAL | @ 17:00:00 | Stop: 2023-03-16

## 2023-03-16 MED ADMIN — azathioprine (IMURAN) tablet 150 mg: 150 mg | ORAL | @ 02:00:00

## 2023-03-16 MED ADMIN — minoxidil (LONITEN) tablet 2.5 mg: 2.5 mg | ORAL | @ 14:00:00 | Stop: 2023-03-16

## 2023-03-16 MED ADMIN — pantoprazole (Protonix) EC tablet 40 mg: 40 mg | ORAL | @ 02:00:00

## 2023-03-16 MED ADMIN — sodium phosphate 18 mmol in dextrose 5 % 250 mL IVPB: 18 mmol | INTRAVENOUS | @ 10:00:00 | Stop: 2023-03-16

## 2023-03-16 NOTE — Unmapped (Signed)
Tacrolimus Therapeutic Monitoring Pharmacy Note    Viraj Liby is a 43 y.o. male continuing tacrolimus.     Indication: Kidney transplant     Date of Transplant:  09/05/22       Prior Dosing Information: Home regimen tacrolimus 10 mg BID, currently on 10 mg QAM + 9 mg QPM      Source(s) of information used to determine prior to admission dosing: Clinic Note    Goals:  Therapeutic Drug Levels  Tacrolimus trough goal:  6-8 ng/ml    Additional Clinical Monitoring/Outcomes  Monitor renal function (SCr and urine output) and liver function (LFTs)  Monitor for signs/symptoms of adverse events (e.g., hyperglycemia, hyperkalemia, hypomagnesemia, hypertension, headache, tremor)    Results:   Tacrolimus level:  4.2 ng/mL, drawn appropriately    Pharmacokinetic Considerations and Significant Drug Interactions:  Concurrent hepatotoxic medications: None identified  Concurrent CYP3A4 substrates/inhibitors: None identified  Concurrent nephrotoxic medications: None identified    Assessment/Plan:  Recommendedation(s)  Increase to tac 10 mg BID    Follow-up  Daily levels with AM labs .   A pharmacist will continue to monitor and recommend levels as appropriate    Please page service pharmacist with questions/clarifications.    [place] M Aleiyah Halpin, PharmD

## 2023-03-16 NOTE — Unmapped (Signed)
Replete IV mg x2. Started on IV sodium phosphate. Up independently. Oxygen on overnight. Dressing to mid neck, intact. Denies pain when asked. Bed in low locked position. Call light within reach.     Problem: Adult Inpatient Plan of Care  Goal: Plan of Care Review  Outcome: Progressing  Goal: Patient-Specific Goal (Individualized)  Outcome: Progressing  Goal: Absence of Hospital-Acquired Illness or Injury  Outcome: Progressing  Intervention: Identify and Manage Fall Risk  Recent Flowsheet Documentation  Taken 03/15/2023 2036 by Jahniya Duzan, RN  Safety Interventions:   fall reduction program maintained   environmental modification   commode/urinal/bedpan at bedside   lighting adjusted for tasks/safety   low bed   no IV/BP/blood draw left arm   nonskid shoes/slippers when out of bed  Goal: Optimal Comfort and Wellbeing  Outcome: Progressing  Goal: Readiness for Transition of Care  Outcome: Progressing  Goal: Rounds/Family Conference  Outcome: Progressing     Problem: Wound  Goal: Optimal Coping  Outcome: Progressing  Goal: Optimal Functional Ability  Outcome: Progressing  Goal: Absence of Infection Signs and Symptoms  Outcome: Progressing  Goal: Improved Oral Intake  Outcome: Progressing  Goal: Optimal Pain Control and Function  Outcome: Progressing  Goal: Skin Health and Integrity  Outcome: Progressing  Intervention: Optimize Skin Protection  Recent Flowsheet Documentation  Taken 03/15/2023 2036 by Maryon Kemnitz, RN  Head of Bed (HOB) Positioning: HOB elevated  Goal: Optimal Wound Healing  Outcome: Progressing     Problem: Acute Kidney Injury/Impairment  Goal: Fluid and Electrolyte Balance  Outcome: Progressing  Goal: Improved Oral Intake  Outcome: Progressing  Goal: Effective Renal Function  Outcome: Progressing     Problem: Chronic Kidney Disease  Goal: Optimal Coping with Chronic Illness  Outcome: Progressing  Goal: Electrolyte Balance  Outcome: Progressing  Goal: Fluid Balance  Outcome: Progressing  Goal: Optimal Functional Ability  Outcome: Progressing  Goal: Absence of Anemia Signs and Symptoms  Outcome: Progressing  Intervention: Manage Signs of Anemia and Bleeding  Recent Flowsheet Documentation  Taken 03/15/2023 2036 by Benjy Kana, RN  Bleeding Precautions: monitored for signs of bleeding  Goal: Optimal Oral Intake  Outcome: Progressing  Goal: Acceptable Pain Control  Outcome: Progressing  Goal: Minimize Renal Failure Effects  Outcome: Progressing     Problem: Airway Clearance Ineffective  Goal: Effective Airway Clearance  Outcome: Progressing

## 2023-03-16 NOTE — Unmapped (Signed)
[name]&Ox4, VSS, refused pain meds, calm and cooperative.  Parathyroidectomy dressing clean and intact. Pt ambulated in room. Bed in low and locked position. Save L arm. Discharge education/instructions done. Work note given to pt. Pt discharged home. Meds brought to bedside per pt request.             Problem: Adult Inpatient Plan of Care  Goal: Plan of Care Review  Outcome: Resolved  Goal: Patient-Specific Goal (Individualized)  Outcome: Resolved  Goal: Absence of Hospital-Acquired Illness or Injury  Outcome: Resolved  Goal: Optimal Comfort and Wellbeing  Outcome: Resolved  Goal: Readiness for Transition of Care  Outcome: Resolved  Goal: Rounds/Family Conference  Outcome: Resolved     Problem: Infection  Goal: Absence of Infection Signs and Symptoms  Outcome: Resolved     Problem: HIV/AIDS Infection  Goal: HIV/AIDS Symptom Control  Outcome: Resolved     Problem: Wound  Goal: Optimal Coping  Outcome: Resolved  Goal: Optimal Functional Ability  Outcome: Resolved  Goal: Absence of Infection Signs and Symptoms  Outcome: Resolved  Goal: Improved Oral Intake  Outcome: Resolved  Goal: Optimal Pain Control and Function  Outcome: Resolved  Goal: Skin Health and Integrity  Outcome: Resolved  Goal: Optimal Wound Healing  Outcome: Resolved     Problem: Acute Kidney Injury/Impairment  Goal: Fluid and Electrolyte Balance  Outcome: Resolved  Goal: Improved Oral Intake  Outcome: Resolved  Goal: Effective Renal Function  Outcome: Resolved     Problem: Chronic Kidney Disease  Goal: Optimal Coping with Chronic Illness  Outcome: Resolved  Goal: Electrolyte Balance  Outcome: Resolved  Goal: Fluid Balance  Outcome: Resolved  Goal: Optimal Functional Ability  Outcome: Resolved  Goal: Absence of Anemia Signs and Symptoms  Outcome: Resolved  Goal: Optimal Oral Intake  Outcome: Resolved  Goal: Acceptable Pain Control  Outcome: Resolved  Goal: Minimize Renal Failure Effects  Outcome: Resolved     Problem: Airway Clearance Ineffective  Goal: Effective Airway Clearance  Outcome: Resolved     Problem: Fall Injury Risk  Goal: Absence of Fall and Fall-Related Injury  Outcome: Resolved

## 2023-03-16 NOTE — Unmapped (Signed)
[name]&Ox4, VSS, calm and cooperative. PRN pain meds given throughout shift.  Parathyroidectomy dressing clean and intact. Pt ambulated in room. Bed in low and locked position. Save L arm.            Problem: Adult Inpatient Plan of Care  Goal: Plan of Care Review  Outcome: Progressing  Goal: Patient-Specific Goal (Individualized)  Outcome: Progressing  Goal: Absence of Hospital-Acquired Illness or Injury  Outcome: Progressing  Intervention: Identify and Manage Fall Risk  Recent Flowsheet Documentation  Taken 03/15/2023 0850 by Freda Jackson, RN  Safety Interventions:   fall reduction program maintained   low bed   nonskid shoes/slippers when out of bed  Intervention: Prevent and Manage VTE (Venous Thromboembolism) Risk  Recent Flowsheet Documentation  Taken 03/15/2023 0850 by Freda Jackson, RN  VTE Prevention/Management:   ambulation promoted   anticoagulant therapy  Goal: Optimal Comfort and Wellbeing  Outcome: Progressing  Goal: Readiness for Transition of Care  Outcome: Progressing  Goal: Rounds/Family Conference  Outcome: Progressing     Problem: Infection  Goal: Absence of Infection Signs and Symptoms  Outcome: Progressing     Problem: HIV/AIDS Infection  Goal: HIV/AIDS Symptom Control  Outcome: Progressing     Problem: Wound  Goal: Optimal Coping  Outcome: Progressing  Goal: Optimal Functional Ability  Outcome: Progressing  Goal: Absence of Infection Signs and Symptoms  Outcome: Progressing  Goal: Improved Oral Intake  Outcome: Progressing  Goal: Optimal Pain Control and Function  Outcome: Progressing  Goal: Skin Health and Integrity  Outcome: Progressing  Intervention: Optimize Skin Protection  Recent Flowsheet Documentation  Taken 03/15/2023 0850 by [place], [place] A, RN  Head of Bed [facility]) Positioning: HOB at 30-45 degrees  Goal: Optimal Wound Healing  Outcome: Progressing     Problem: Acute Kidney Injury/Impairment  Goal: Fluid and Electrolyte Balance  Outcome: Progressing  Goal: Improved Oral Intake  Outcome: Progressing  Goal: Effective Renal Function  Outcome: Progressing     Problem: Chronic Kidney Disease  Goal: Optimal Coping with Chronic Illness  Outcome: Progressing  Goal: Electrolyte Balance  Outcome: Progressing  Goal: Fluid Balance  Outcome: Progressing  Goal: Optimal Functional Ability  Outcome: Progressing  Goal: Absence of Anemia Signs and Symptoms  Outcome: Progressing  Goal: Optimal Oral Intake  Outcome: Progressing  Goal: Acceptable Pain Control  Outcome: Progressing  Goal: Minimize Renal Failure Effects  Outcome: Progressing     Problem: Airway Clearance Ineffective  Goal: Effective Airway Clearance  Outcome: Progressing     Problem: Fall Injury Risk  Goal: Absence of Fall and Fall-Related Injury  Outcome: Progressing  Intervention: Promote Injury-Free Environment  Recent Flowsheet Documentation  Taken 03/15/2023 0850 by [place], [place], RN  Safety Interventions:   fall reduction program maintained   low bed   nonskid shoes/slippers when out of bed

## 2023-03-16 NOTE — Unmapped (Signed)
Surgery Consult Follow-up Note    Assessment:  Christian Brown is a 43 y.o. male with hx of ESRD s/p DDKT in 7/24 and with recurrent tertiary hyperparathyroidism after right and left superior and left partial parathyroidectomy in 2021 who underwent a right inferior parathyroidectomy on 03/13/2022. Post-op PTH has dropped appropriately and his serum calcium levels are normalizing.     Interval History/Events:  Doing well. Pain around incision. Dressing is dry with no concerns for a hematoma. PTH has fallen to 73 and Calcium has normalized now.    Recommendations:  No indications to start calcitriol/ Calcium carbonate supplements  We will schedule follow-up in clinic    Thank you for involving [place] in this patient's care. Surgical oncology will sign off. Please page [phone] with any questions or concerns.      Objective:    Physical Exam:  Temp:  [36.5 ??C (97.7 ??F)] 36.5 ??C (97.7 ??F)  Heart Rate:  [85-90] 90  Resp:  [18-20] 18  BP: (106-137)/(68-89) 109/77  MAP (mmHg):  [81-102] 86  SpO2:  [98 %-100 %] 100 %    General: awake, alert and oriented x 3  Neck: incision is covered with dressing, appropriately tender to palpation without evidence of a hematoma.    Chest: no increased WOB on RA   CVS: RR  Ext: warm and well perfused     Data Review:  Pertinent labs/imaging reviewed x 24 hrs    [facility]  General Surgery, PGY3

## 2023-03-16 NOTE — Unmapped (Signed)
Physician Discharge Summary [facility]  3 [facility]  [address]  [place] [address]  Dept: [phone]  Loc: [phone]     Identifying Information:   Christian Brown  1981-01-26  [id number]    Primary Care Physician: Belva Bertin, RTL     Code Status: Full Code    Admit Date: 03/01/2023    Discharge Date: 03/16/2023     Discharge To: Home    Discharge Service: [facility] - Nephrology Floor Team (MED B - Tower)     Discharge Attending Physician: Lenore Manner Denu-Ciocca, MD    Discharge Diagnoses: The problem list should be reviewed and updated in Epic to reflect your hospital course, be sure to include malnutrition and mobility issue diagnoses (then delete this red text)  Principal Problem:    Acute kidney injury superimposed on chronic kidney disease (CMS-HCC) (POA: Unknown)  Active Problems:    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)    CMV (cytomegalovirus infection) (CMS-HCC) (POA: Yes)    Essential hypertension (POA: Yes)    HIV infection (CMS-HCC) (POA: Yes)    OSA (obstructive sleep apnea) (POA: Yes)    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) (POA: Not Applicable)    HIV nephropathy (CMS-HCC) (POA: Yes)    Chronic deep vein thrombosis (DVT) of femoral vein of left lower extremity (CMS-HCC) (POA: Yes)    CAP (community acquired pneumonia) (POA: Yes)    Mild intermittent asthma without complication (POA: Yes)    Pulmonary hypertension (CMS-HCC) (POA: Yes)    Iron deficiency anemia (POA: Yes)    History of DVT (deep vein thrombosis) (POA: Not Applicable)    Anemia due to stage 4 chronic kidney disease (CMS-HCC) (POA: Yes)    Secondary renal hyperparathyroidism (CMS-HCC) (POA: Yes)    Hypercalcemia due to chronic kidney disease (POA: Unknown)    Hemodialysis AV fistula thrombosis (CMS-HCC) (POA: Unknown)  Resolved Problems:    * No resolved hospital problems. 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 Follows with transplant nephrologist [name] (last seen 02/02/2023). Baseline creatinine is 1.5-1.7, elevated on admission to 2.47. Seen 02/27/2023 at [facility] ED, with creatinine at  2.88 and left to come sooner to [facility] ED for ongoing evaluation and care. UA with rare bacteria and mucus, no protein. Renal transplant ultrasound with new mild right hydronephrosis w/ mildly elevated resistive indices improved from previous study. With unknown cause for AKI, a renal biopsy of transplant kidney was completed on 1/7. HLA DSA reassuringly negative. 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 A few non scared area have mild tubulitis not associated with CaPO4 deposits so cannot excluded concurrent TCMR. Thus decision was made to treat as acute transplant rejection with high dose steroids followed by prednisone taper. Also had parathyroidectomy done on 03/14/23 with improvements in Ca levels. Cr improved on discharge. Continued home tacrolimus 10mg  PO BID & azathioprine 150 mg PO daily.     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 PTH was elevated to 2182 on 12/14/2022. PTH from 03/02/23 of 1795. Received 1L of LR in the ED. Considered granulomatous disease, tertiary hyperparathyroidism, underlying worsening dehydration, malignancy. Considered cinacalet for him as well, but endorsed having an adverse reaction to it. Ca-125 WNL. ACE WNL. Thus symptoms likely related to known parathyroid disease. Patient with persistently elevated, but relatively stable serum Ca & ionized Ca. Underwent parathyroidectomy for final parathyroid gland with Dr. Selena Batten on 1/15 without complication.     C/f Multifocal Pneumonia - Hx of PJP Pneumonia - Productive Cough   One month of shortness of breath along with productive cough. 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 Presented [facility] on 02/27/2023 and was treated with vancomycin & cefepime. He then presented to [facility]. Chest CT at OSH was significant for bilateral patchy reticular opacities consistent with pneumonia. Found to have an elevated inflammatory markers (ESR 70, CRP 16). Negative RPP. Normal lactate. Afebrile here, but did endorse subjective fevers at home, and was having productive cough with yellow sputum. Reassuring stable on room air.  No acute findings on CXR. Considering HCAP, fungal vs PJP pneumonia, vs another abnormal granulomatous disease. ICID consulted and seemingly more consistent with underlying pneumonia and improved with levofloxacin. Induced sputum for PJP negative. Symptoms resolved on discharge.       HIV (on ARVT) - Immunosuppression - Hx CMV, Syphilis, PJP  Diagnosed w/ HIV 10/2013. Follows with outpatient ID Dr. [name] in [place]. On Biktarvy. HIV undetected when checked on 10/27/2022. Viral load has been <20 since 2018. For CMV prophylaxis, patient chose to do preemptive monitoring and has never taken Valcyte. Last CMV PCR was detectable at <200 on 01/29/2023. For PJP prophylaxis, he previously has not tolerated Bactrim, pentamadine, or dapsone. Has been prescribed Atovaquone with strong recommendation for compliance with this medication, but was not taking it as of his last visit with his transplant nephrologist ([name]) on 02/02/2023. CD4 of 75 thought to be due thymoglobulin + immunosuppression. Continued home Biktarvy 1 tablet PO daily.     Chest Pain - C/f Demand Ischemia   Patient complained of constant chest pain on left side of chest that didn't radiate to jaw/left arm. Stated it was worse when he lays down. Previously occurred when patient was hypotensive. BP 111/65 at time of evaluation. Discussed obtaining EKG, however patient refused. He stated he understood that we could be missing potential cardiac dysfunction, including STEMI, if he refused the EKG. Troponin 182 - 164 and thus likely consistent with demand ischemia. Patient's pain resolved without additional intervention.     HTN Urgency  Noted that Goal BP is <130/80. In the past, patient has not tolerated amlodipine, hydralazine, metoprolol, labetalol. Losartan held at OSH due to hyperkalemia (K 5.4). Did have one pressure reading with a SBP >200 ,and increased home carvedilol 12.5mg  PO BID to 25 mg BID. Patient still with elevated BP and thus minoxidil 2.5 mg daily as patient previously tolerated well.      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 Fistula thrombosis is s/p excision with Transplant Surgery in May 2024. With history can consider PE. No chest pain, however held on CTA with AKI. Hematology has been consulted  in the past. PVL's with no acute clot, but left thigh showing signs of chronic obstruction.        Chronic Problems  Anemia of Chronic Kidney Disease : Baseline Hgb 12-13, 11.9 on admission. B12, folate w/n limits. Monitored with Daily CBC.   Mild Intermittent Asthma - OSA: Uses albuterol inhaler as needed at home. Diagnosed with severe obstructive sleep apnea following 10/21/2019 sleep study. Does not use a CPAP at night. Stable on room air since admission. Continued albuterol PRN      The patient's hospital stay has been complicated by the following clinically significant conditions requiring additional evaluation and treatment or having a significant effect of this patient's care: - Chronic kidney disease POA requiring further investigation, treatment, or monitoring  - Dehydration POA requiring further investigation, treatment, or monitoring      Outpatient Provider Follow Up Issues:   [ ]  Tx Nephrologist to follow Cr and Ca levels  [ ]  F/u with Surgical Onc for post-op care.    Touchbase with Outpatient Provider:  Warm Handoff: Completed on 03/16/23 by Windle Guard, MD  (Resident) via [facility] Message    Procedures:  Renal Biopsy and Parathyroidectomy  ______________________________________________________________________  Discharge Medications:      Your Medication List        START taking these medications      atovaquone 750 mg/5 mL suspension  Commonly known as: MEPRON  Take 10 mL (1,500 mg total) by mouth daily.  Start taking on: March 17, 2023     calcium carbonate 200 mg calcium (500 mg) chewable tablet  Commonly known as: TUMS  Chew 2 tablets (400 mg elem calcium total) two (2) times a day.     minoxidil 2.5 MG tablet  Commonly known as: LONITEN  Take 1 tablet (2.5 mg total) by mouth daily.  Start taking on: March 17, 2023     oxyCODONE 10 mg immediate release tablet  Commonly known as: ROXICODONE  Take 1 tablet (10 mg total) by mouth every six (6) hours as needed.     senna 8.6 mg tablet  Commonly known as: SENOKOT  Take 1 tablet by mouth daily.            CHANGE how you take these medications      carvedilol 12.5 MG tablet  Commonly known as: COREG  Take 2 tablets (25 mg total) by mouth two (2) times a day.  What changed: how much to take     tacrolimus 1 MG capsule  Commonly known as: PROGRAF  Take 10 capsules (10 mg total) by mouth in the morning and take 10 capsules (10 mg total) in the evenings.  What changed:   medication strength  how much to take  how to take this  when to take this  additional instructions            CONTINUE taking these medications      azathioprine 50 mg tablet  Commonly known as: IMURAN  Take 1 tablet (50 mg total) by mouth daily. Take in addition to 100 mg tablet for a total daily dose to of 150 mg daily     azathioprine 100 mg tablet  Commonly known as: AZASAN  Take 1 tablet (100 mg total) by mouth daily. Take in addition to 50 mg tablet for a total daily dose of 150 mg daily     BIKTARVY 50-200-25 mg tablet  Generic drug: bictegrav-emtricit-tenofov ala  Take 1 tablet by mouth every morning.  DIETARY SUPPLEMENT,MISC COMB14 ORAL  Taking magnesium gummy supplement once daily. Dosage strength & product unknown     diphenhydrAMINE 25 mg capsule  Commonly known as: BENADRYL  Take 1 capsule (25 mg total) by mouth every six (6) hours as needed for itching.     empty container Misc  Use as directed with Zarxio     HYDROcodone-acetaminophen 5-325 mg per tablet  Commonly known as: NORCO  Take 1 tablet by mouth daily as needed for pain.     pantoprazole 40 MG tablet  Commonly known as: Protonix  Take 1 tablet (40 mg total) by mouth two (2) times a day.     predniSONE 5 MG tablet  Commonly known as: DELTASONE  Take 1 tablet (5 mg total) by mouth in the morning.     VENTOLIN HFA 90 mcg/actuation inhaler  Generic drug: albuterol  Inhale 1-2 puffs every four (4) to six (6) hours as needed for wheezing or shortness of breath.     ZARXIO 480 mcg/0.8 mL Syrg  Generic drug: filgrastim-sndz  Inject 0.8 mL (480 mcg total) under the skin once a week. Administer only as directed by transplant team.              Allergies:  Amlodipine, Doxercalciferol, Fish containing products, Iron sucrose, Labetalol, Metoclopramide, Metoprolol, Nifedipine, Other, Sulfamethoxazole-trimethoprim, Vancomycin, Calcitonin, Peanut oil, Potassium clavulanate, Azathioprine, Chlorhexidine, Clindamycin, Hydralazine, and Sensipar [cinacalcet]  ______________________________________________________________________  Pending Test Results:  Pending Labs       Order Current Status    Surgical pathology exam In process            Most Recent Labs:  All lab results last 24 hours -   Recent Results (from the past 24 hours)   Renal Function Panel    Collection Time: 03/15/23  7:32 PM   Result Value Ref Range    Sodium 135 135 - 145 mmol/L    Potassium 5.0 (H) 3.4 - 4.8 mmol/L    Chloride 110 (H) 98 - 107 mmol/L    CO2 19.0 (L) 20.0 - 31.0 mmol/L    Anion Gap 6 5 - 14 mmol/L    BUN 35 (H) 9 - 23 mg/dL    Creatinine 6.57 (H) 0.73 - 1.18 mg/dL    BUN/Creatinine Ratio 15     eGFR CKD-EPI (2021) Male 34 (L) >=60 mL/min/1.24m2    Glucose 164 70 - 179 mg/dL    Calcium 8.0 (L) 8.7 - 10.4 mg/dL    Phosphorus 2.2 (L) 2.4 - 5.1 mg/dL    Albumin 3.3 (L) 3.4 - 5.0 g/dL   Ionized Calcium, Venous    Collection Time: 03/15/23  7:32 PM   Result Value Ref Range    Calcium, Ionized Venous 4.66 4.40 - 5.40 mg/dL   Magnesium Level    Collection Time: 03/15/23  7:32 PM   Result Value Ref Range    Magnesium 1.4 (L) 1.6 - 2.6 mg/dL   Ionized Calcium, Venous    Collection Time: 03/16/23  8:31 AM   Result Value Ref Range    Calcium, Ionized Venous 4.35 (L) 4.40 - 5.40 mg/dL   Tacrolimus Level, Trough    Collection Time: 03/16/23  8:33 AM   Result Value Ref Range    Tacrolimus, Trough 4.2 (L) 5.0 - 15.0 ng/mL   CBC    Collection Time: 03/16/23  8:33 AM   Result Value Ref Range    WBC 2.2 (L) 3.6 - 11.2 10*9/L    RBC  2.77 (L) 4.26 - 5.60 10*12/L    HGB 10.1 (L) 12.9 - 16.5 g/dL    HCT 16.1 (L) 09.6 - 48.0 %    MCV 103.9 (H) 77.6 - 95.7 fL    MCH 36.4 (H) 25.9 - 32.4 pg    MCHC 35.0 32.0 - 36.0 g/dL    RDW 04.5 (H) 40.9 - 15.2 %    MPV 8.5 6.8 - 10.7 fL    Platelet 113 (L) 150 - 450 10*9/L   Renal Function Panel    Collection Time: 03/16/23  8:33 AM   Result Value Ref Range    Sodium 140 135 - 145 mmol/L    Potassium 4.3 3.4 - 4.8 mmol/L    Chloride 107 98 - 107 mmol/L    CO2 21.0 20.0 - 31.0 mmol/L    Anion Gap 12 5 - 14 mmol/L    BUN 40 (H) 9 - 23 mg/dL    Creatinine 8.11 (H) 0.73 - 1.18 mg/dL    BUN/Creatinine Ratio 17     eGFR CKD-EPI (2021) Male 35 (L) >=60 mL/min/1.84m2    Glucose 161 70 - 179 mg/dL    Calcium 7.5 (L) 8.7 - 10.4 mg/dL    Phosphorus 3.4 2.4 - 5.1 mg/dL    Albumin 2.9 (L) 3.4 - 5.0 g/dL   Magnesium Level    Collection Time: 03/16/23  8:33 AM   Result Value Ref Range    Magnesium 2.3 1.6 - 2.6 mg/dL   Ionized Calcium, Venous    Collection Time: 03/16/23  1:27 PM   Result Value Ref Range    Calcium, Ionized Venous 4.33 (L) 4.40 - 5.40 mg/dL       Relevant Studies/Radiology:  US Renal Transplant Limited  Result Date: 03/07/2023  EXAM: US RENAL TRANSPLANT LIMITED ACCESSION: [id number] UN CLINICAL INDICATION: Pain in RLQ s/p renal biopsy COMPARISON: Limited renal transplant ultrasound from 03/06/2023 TECHNIQUE:  Static and cine images of the renal transplant and urinary bladder were obtained. FINDINGS: TRANSPLANTED KIDNEY: The renal transplant was located in the right lower quadrant. No perinephric collection is identified status post biopsy. Small region of twinkle artifact noted within the left lower pole in the region of recent biopsy, favored represent post embolization material versus vascular calcification. Normal size and echogenicity. No solid masses or calculi. No hydronephrosis.      BLADDER: Underdistention of the urinary bladder with circumferential urinary bladder wall thickening.      - No perinephric fluid collection/hematoma associated with the transplant kidney status post biopsy.     IR Biopsy Renal Percutaneous  Result Date: 03/07/2023  EXAM: IR BIOPSY RENAL PERCUTANEOUS DATE: 03/06/2023 5:02 PM ACCESSION: [id number] UN DICTATED: 03/06/2023 5:17 PM INTERPRETATION LOCATION: Main [facility] PROCEDURE: Ultrasound-guided biopsy Pre-procedure diagnosis: Status post right lower quadrant renal transplant, concerning for acute rejection. Post-procedure diagnosis: Same Indication: Histopathologic diagnosis, concern for acute rejection Additional clinical history: None Complications: No immediate complications.     Ultrasound-guided non-target biopsy of right lower quadrant transplant kidney.. Plan: Specimen(s) sent for evaluation. _______________________________________________________________ PROCEDURE SUMMARY: - Percutaneous US-guided coaxial core needle biopsy - Additional procedure(s): None PROCEDURE DETAILS: Procedure personnel: Attending physician(s): Dr. Benjamine Mola Fellow physician(s): Dr. Kendrick Ranch Advanced practice provider(s): None Pre-procedure Consent: Informed consent for the procedure including risks, benefits and alternatives was obtained and time-out was performed prior to the procedure. Preparation: The site was prepared and draped using maximal sterile barrier technique including cutaneous antisepsis. Anesthesia/sedation Level of anesthesia/sedation: Moderate sedation (conscious sedation) Anesthesia/sedation administered by: Independent trained observer  under attending supervision with continuous monitoring of the patient's level of consciousness and physiologic status Total intra-service sedation time (minutes): I personally spent 20 minutes, continuously monitoring the patient face-to-face during the administration of moderate sedation. Radiology nurse was present for the duration of the procedure to assist in patient monitoring. Pre and Post Sedation activities have been reviewed. Imaging prior to biopsy The patient was positioned supine. Initial ultrasound was performed. Biopsy target: - Maximal diameter (cm): Not applicable - Location: Right lower quadrant transplant kidney (nontargeted biopsy) Other findings: None Biopsy Local anesthesia was administered. Under US guidance, the biopsy needle was advanced to the target and biopsy was performed. Coaxial needle: 17 gauge Core needle biopsy device: CorVocet Core needle size: 18 gauge Number of core specimens: 3 On-site biopsy touch preparation: No  Additional sampling recommendations: Not applicable Preliminary assessment of sample adequacy: Not applicable Needle removal The biopsy needle was removed and a sterile dressing was applied. Tract embolization: Gelfoam slurry Imaging following biopsy Immediate post-biopsy ultrasound was performed. Post-biopsy imaging findings: No hematoma Additional Details Additional description of procedure: None Equipment details: None Specimens removed: None Estimated blood loss (mL): less than 10 mL Attestation Signer name: Benjamine Mola I attest that I supervised the procedure and was immediately available. I reviewed the stored images and agree with the report as written.    US Renal Transplant Limited  Result Date: 03/06/2023  EXAM: US RENAL TRANSPLANT LIMITED DATE: 03/06/2023 11:08 AM ACCESSION: [id number] UN DICTATED: 03/06/2023 11:10 AM INTERPRETATION LOCATION: MAIN CAMPUS CLINICAL INDICATION: 14 years year old Male: kidney transplant AKI  COMPARISON: Renal transplant ultrasound 03/01/2023 BIOPSY PROCEDURE:   Images of the right lower quadrant renal transplant were obtained in transverse and longitudinal planes. There was overlying bowel and vessels along the planned biopsy trajectory so the procedure was canceled. Mild hydronephrosis is again noted. Impression    Planned renal biopsy was canceled due to intervening bowel and vessels.     Echocardiogram Follow Up/Limited Echo  Result Date: 03/04/2023  Patient Info Name:     Desten Manor Age:     42 years DOB:     May 13, 1980 Gender:     Male MRN:     [id number] Accession #:     [id number] UN Account #:     [id number] Ht:     170 cm Wt:     73 kg BSA:     1.87 m2 BP:     128 /     99 mmHg Exam Date:     03/02/2023 2:15 PM Admit Date:     03/01/2023 Exam Type:     ECHOCARDIOGRAM FOLLOW UP/LIMITED ECHO Technical Quality:     Fair Staff Sonographer:     Otho Perl Study Info Indications      - ELEVATED BNP,  evaluate for RV strain Procedure(s)   Limited color flow, 2D and Doppler transthoracic echocardiogram is performed. Summary   1. Limited study to assess ventricular function.   2. The left ventricle is normal in size with severely increased wall thickness.   3. The left ventricular systolic function is hyperdynamic, LVEF is visually estimated at >70%.   4. The mitral valve leaflets are moderately thickened with normal leaflet mobility.   5. Mitral annular calcification is present (moderate).   6. There is likely moderate mitral stenosis, with interpretation limited in the setting of elevated heart rate and significant mitral annular calcification.   7. The right ventricle is normal in size, with normal systolic function. Left  Ventricle   The left ventricle is normal in size with severely increased wall thickness. The left ventricular systolic function is hyperdynamic, LVEF is visually estimated at >70%. There is a mild increase in the left ventricular intracavitary gradient at rest. Peak gradient 25 mm Hg on this study. Left ventricular diastolic function cannot be accurately assessed. Longitudinal strain of the left ventricle is severely decreased, -6.0 %. Strain imaging demonstrates preserved strain at the LV apex. Right Ventricle   The right ventricle is normal in size, with normal systolic function. Aortic Valve   The aortic valve is trileaflet with mildly thickened leaflets with normal excursion. There is no significant aortic regurgitation. There is no evidence of a significant transvalvular gradient. Mitral Valve   The mitral valve leaflets are moderately thickened with normal leaflet mobility. Mitral annular calcification is present (moderate). There is no significant mitral valve regurgitation. There is likely moderate mitral stenosis, with interpretation limited in the setting of elevated heart rate and significant mitral annular calcification. Peak velocity:  2.3 m/sec. Mean diastolic gradient: 13 mmHg at a heart rate of 101 bpm. Tricuspid Valve   The tricuspid valve leaflets are poorly visualized but probably normal, with normal leaflet mobility. There is no significant tricuspid regurgitation. The pulmonary systolic pressure cannot be estimated due to insufficient TR signal. Pulmonic Valve   The pulmonic valve is thickened. Inferior Vena Cava   IVC size and inspiratory change suggest normal right atrial pressure. (0-5 mmHg). Other Findings   Rhythm: Sinus Rhythm. Ventricles ---------------------------------------------------------------------- Name                                 Value        Normal ---------------------------------------------------------------------- LV Dimensions 2D/MM ----------------------------------------------------------------------  IVS Diastolic Thickness (2D)                                1.8 cm       0.6-1.0 LVID Diastole (2D)                  4.8 cm       4.2-5.8  LVPW Diastolic Thickness (2D)                                1.6 cm       0.6-1.0 LVID Systole (2D)                   2.8 cm       2.5-4.0 LV Mass Index (2D Cubed)          196 g/m2        49-115  Relative Wall Thickness (2D)                                  0.67        <=0.42 RV Dimensions 2D/MM ---------------------------------------------------------------------- TAPSE                               2.0 cm         >=1.7 Left Ventricular Outflow Tract ---------------------------------------------------------------------- Name  Value        Normal ---------------------------------------------------------------------- LVOT Doppler ---------------------------------------------------------------------- LVOT Peak Velocity                 2.5 m/s               LVOT VTI                             38 cm               MV VTI/LVOT VTI Ratio                 1.08 Aortic Valve ---------------------------------------------------------------------- Name                                 Value        Normal ---------------------------------------------------------------------- AV Doppler ---------------------------------------------------------------------- AV Peak Velocity                   2.1 m/s               AV Peak Gradient                   17 mmHg               AV Mean Gradient                    8 mmHg               AV VTI                               34 cm               AV DI (Vel)                           1.20               AV DI (VTI)                           1.12 Mitral Valve ---------------------------------------------------------------------- Name                                 Value        Normal ---------------------------------------------------------------------- MV Doppler ---------------------------------------------------------------------- MV Peak Velocity                   2.3 m/s               MV Mean Gradient                   13 mmHg               MV Diastolic Function ---------------------------------------------------------------------- MV E Peak Velocity                148 cm/s               MV A Peak Velocity                245 cm/s               MV E/A  0.6               MV Annular TDI ---------------------------------------------------------------------- MV Septal e' Velocity            10.9 cm/s         >=8.0 MV E/e' (Septal)                      13.6               MV Lateral e' Velocity            4.5 cm/s        >=10.0 MV E/e' (Lateral)                     33.2               MV e' Average                     7.7 cm/s               MV E/e' (Average)                     23.4 Tricuspid Valve ---------------------------------------------------------------------- Name                                 Value        Normal ---------------------------------------------------------------------- Estimated PAP/RSVP ---------------------------------------------------------------------- RA Pressure                         3 mmHg           <=5 Report Signatures Finalized by Hart Carwin  MD on 03/04/2023 07:30 AM Resident Cleaster Corin on 03/02/2023 03:45 PM    PVL Venous Duplex Lower Extremity Bilateral  Result Date: 03/02/2023   Peripheral Vascular Lab     [address]   [place], [place] [address]  PVL VENOUS DUPLEX LOWER EXTREMITY BILATERAL Patient Demographics Pt. Name: RASHOD GOUGEON Location: PVL Inpatient Lab MRN:      [id number]        Sex:      M DOB:      1980/06/08     Age:      4 years  Study Information Authorizing         [id number] [facility]   Performed Time       03/02/2023 Provider Name       [facility]                                    11:37:37 AM Ordering Physician  Alvino Chapel Patient Location     [facility] Accession Number    [id number] UN         Technologist         Marcy Siren                                                                 RVT Diagnosis:  Assisting                                            Technologist Ordered Reason For Exam: hx chronic DVT LLE femoral artery Indication: Risk Factors: DVT (Prior h/o LLE DVT ). Anticoagulation: (Heparin). Protocol The major deep veins from the inguinal ligament to the ankle are assessed for bilaterally for compressibility and color and spectral Doppler flow characteristics. The assessed veins include bilateral common femoral vein, femoral vein in the thigh, popliteal vein, and intramuscular calf veins. The iliac vein is assessed indirectly using Doppler waveform analysis. The great saphenous vein is assessed for compressibility at the saphenofemoral junction, and the small saphenous vein assessed for compressibility behind the knee.  Final Interpretation Right There is no evidence of DVT in the lower extremity. There is no evidence of obstruction proximal to the inguinal ligament or in the common femoral vein. Left Abnormalities consistent with the sequela of a prior venous obstructive process, with findings that appear chronic/long standing in nature are identified in the intramuscular calf veins, calf veins, and proximal veins.  Electronically signed by [id number] Jodell Cipro MD on 03/02/2023 at 4:14:54 PM.  -------------------------------------------------------------------------------- Right Duplex Findings All veins visualized appear fully compressible. Doppler flow signals demonstrate normal spontaneity, phasicity, and augmentation.  Left Duplex Findings The left soleal vein, peroneal vein, popliteal vein and femoral vein at distal thigh is partially compressible and appears rigid with compression, brightly echogenic and with striations. All other veins visualized appear fully compressible and demonstrate appropriate Doppler characteristics. Right Technical Summary No evidence of deep venous obstruction in the lower extremity. No indirect evidence of obstruction proximal to the inguinal ligament. Left Technical Summary Evidence of chronic obstruction in the left soleal vein, peroneal vein, popliteal vein and femoral vein at distal thigh. All other veins visualized appear fully compressible and demonstrate appropriate Doppler characteristics.  Final     CT Chest Interpretation of Outside film  Result Date: 03/02/2023  EXAM: CT CHEST INTERPRETATION OF OUTSIDE FILM ACCESSION: [id number] UN REPORT DATE: 03/02/2023 9:19 AM CLINICAL INDICATION: Not provided. TECHNIQUE: Outside CT chest performed without intravenous contrast February 27, 2023 submitted for review. Images were loaded into the [facility] PACS. COMPARISON: July 30, 2022. FINDINGS: Agree with outside report. Diffuse airway thickening throughout both lungs with new areas of clustered micronodules in the lower lungs suggesting acute bronchiolitis. Some of the airway inflammation is likely chronic. Large pulmonary artery and cardiac chambers with no other changes.     Agree with outside report as discussed. PLEASE NOTE:  Our interpretation of studies performed at an outside institution is limited by factors such as the absence of technical specifics of the image, undisclosed clinical information, and the unavailability of the original interpretation. Specialists at the institution that performed the study may have access to information not available to [place] that could make a difference in this interpretation. We suggest you obtain the original interpretation from the site where the study was performed.     US Renal Transplant W Doppler  Result Date: 03/01/2023  EXAM: US RENAL Glenna Durand ACCESSION: [id number] UN REPORT DATE: 03/01/2023 2:54 PM CLINICAL INDICATION: 43 years old with elevated creatinine, concern for rejection COMPARISON: CT abdomen pelvis 09/27/2022, renal transplant ultrasound with Doppler 09/06/2022 TECHNIQUE:  Ultrasound views of the renal transplant were obtained using gray scale and color and spectral Doppler imaging. Views of the urinary bladder  were obtained using gray scale and limited color Doppler imaging. FINDINGS: TRANSPLANTED KIDNEY: The renal transplant was located in the right lower quadrant. Normal size and echogenicity. Mild dilatation of the renal pelvis and central calyces. 0.4 cm echogenic focus with associated twinkle artifact, which may represent a nonobstructive renal calculus. No solid masses. No perinephric collections identified. VESSELS: - Perfusion: Using power Doppler, normal perfusion was seen throughout the renal parenchyma. - Resistive indices in the renal transplant are decreased compared with prior examination, however, the resistive indices of the main renal artery remain above normal limits and the resistive indices of the segmental artery are mildly elevated beyond normal limits. - Main renal artery/iliac artery: Patent - Main renal vein/iliac vein: Patent BLADDER: Unremarkable.     Patent vasculature with mildly elevated resistive indices at the main renal artery anastomosis, improved from prior. Velocities and resistive indices are otherwise unremarkable, and improved from prior. New mild right hydronephrosis. Nonobstructing subcentimeter stone within the renal transplant. Please see below for data measurements: Transplant location: Renal Transplant: Sagittal 11.2 cm; AP 4.85 cm; Transverse 8.20 cm Segmental artery superior resistive index: 0.74 Segmental artery mid resistive index: 0.71 Segmental artery inferior resistive index: 0.69 Previous resistive indices range of segmental arteries: 0.72-0.86 Main renal artery peak systolic velocity at anastomosis: 1.35 m/s Main renal artery hilum resistive index: 0.79 Main renal artery mid resistive index: 0.78 Main renal artery anastomosis resistive index: 0.85 Previous resistive indices range of main renal artery: 0.83-0.9 Main renal vein: patent Iliac artery: Patent Iliac vein: Patent Bladder volume prevoid: 147.9  mL     XR Chest 2 views  Result Date: 03/01/2023  EXAM: XR CHEST 2 VIEWS ACCESSION: [id number] UN REPORT DATE: 03/01/2023 12:22 PM CLINICAL INDICATION: Cough. TECHNIQUE: PA and Lateral Chest Radiographs. COMPARISON: September 07, 2022. FINDINGS: The lungs are clear. No pleural effusion or pneumothorax. Normal cardiomediastinal contour. Renal osteodystrophy.     No acute findings.    ECG 12 Lead  Result Date: 03/01/2023  SINUS RHYTHM WITH PREMATURE ATRIAL BEATS WITH ABERRANT CONDUCTION BIATRIAL ENLARGEMENT PATTERN LEFT VENTRICULAR HYPERTROPHY ( Sokolow-Lyon , Cornell product ) ST & T WAVE ABNORMALITY, CONSIDER INFEROLATERAL ISCHEMIA ABNORMAL ECG WHEN COMPARED WITH ECG OF 28-Sep-2022 10:18, INFERIOR ST & T WAVE ABNORMALITY NOW EVIDENT IN INFERIOR LEADS Confirmed by Lavonna Monarch [phone]) on 03/01/2023 11:55:19 AM   ______________________________________________________________________  Discharge Instructions:   Activity Instructions       Activity as tolerated                       Follow Up instructions and Outpatient Referrals     Call MD for:  difficulty breathing, headache or visual disturbances      Call MD for:  persistent nausea or vomiting      Call MD for:  severe uncontrolled pain      Call MD for:  temperature >38.5 Celsius      Discharge instructions          Appointments which have been scheduled for you      Mar 20, 2023 3:45 PM  (Arrive by 3:30 PM)  RETURN ACTIVE [place] with Johny Chess, MD  [facility] SURGICAL ONCOLOGY [facility] REGION) [address]  [address]  [place] [address]  [phone]        Jul 04, 2023 12:30 PM  (Arrive by 12:15 PM)  RETURN NEPHROLOGY POST with Leafy Half, MD  [facility] KIDNEY TRANSPLANT [place]  [facility]  REGION) [address]  [facility] [address]  [place] [address]  [phone]             ______________________________________________________________________  Discharge Day Services:  BP 144/99  - Pulse 101  - Temp 37 ??C (98.6 ??F) (Oral)  - Resp 20  - Ht 170.2 cm (5' 7.01)  - Wt 73.2 kg (161 lb 6 oz)  - SpO2 100%  - BMI 25.27 kg/m??     Pt seen on the day of discharge and determined appropriate for discharge.    Condition at Discharge: stable    Length of Discharge: I spent greater than 30 mins in the discharge of this patient.     Please excuse any typos in this documentation, it was written using Scientist, clinical (histocompatibility and immunogenetics).     Camelia Phenes MD  [facility] Internal Medicine PGY-3  [phone]

## 2023-03-17 MED ORDER — ATOVAQUONE 750 MG/5 ML ORAL SUSPENSION
Freq: Every day | ORAL | 0 refills | 30.00 days | Status: CP
Start: 2023-03-17 — End: 2023-04-16

## 2023-03-17 MED ORDER — MINOXIDIL 2.5 MG TABLET
ORAL_TABLET | Freq: Every day | ORAL | 0 refills | 30.00 days | Status: CP
Start: 2023-03-17 — End: 2023-04-16
  Filled 2023-03-16: qty 30, 30d supply, fill #0

## 2023-03-17 NOTE — Unmapped (Signed)
Per [name], pt advised to start calcitriol 1 mcg daily s/p parathyroidectomy.  Pt send [place] my chart message and script sent to [facility] in [place].

## 2023-03-19 DIAGNOSIS — Z79899 Other long term (current) drug therapy: Principal | ICD-10-CM

## 2023-03-19 DIAGNOSIS — Z940 Kidney transplant status: Principal | ICD-10-CM

## 2023-03-19 LAB — CBC W/ DIFFERENTIAL
BANDED NEUTROPHILS ABSOLUTE COUNT: 0 10*3/uL (ref 0.0–0.1)
BASOPHILS ABSOLUTE COUNT: 0 10*3/uL (ref 0.0–0.2)
BASOPHILS RELATIVE PERCENT: 1 %
EOSINOPHILS ABSOLUTE COUNT: 0 10*3/uL (ref 0.0–0.4)
EOSINOPHILS RELATIVE PERCENT: 2 %
HEMATOCRIT: 29.3 % — ABNORMAL LOW (ref 37.5–51.0)
HEMOGLOBIN: 9.6 g/dL — ABNORMAL LOW (ref 13.0–17.7)
IMMATURE GRANULOCYTES: 1 %
LYMPHOCYTES ABSOLUTE COUNT: 0.5 10*3/uL — ABNORMAL LOW (ref 0.7–3.1)
LYMPHOCYTES RELATIVE PERCENT: 32 %
MEAN CORPUSCULAR HEMOGLOBIN CONC: 32.8 g/dL (ref 31.5–35.7)
MEAN CORPUSCULAR HEMOGLOBIN: 34.7 pg — ABNORMAL HIGH (ref 26.6–33.0)
MEAN CORPUSCULAR VOLUME: 106 fL — ABNORMAL HIGH (ref 79–97)
MONOCYTES ABSOLUTE COUNT: 0.2 10*3/uL (ref 0.1–0.9)
MONOCYTES RELATIVE PERCENT: 16 %
NEUTROPHILS ABSOLUTE COUNT: 0.7 10*3/uL — ABNORMAL LOW (ref 1.4–7.0)
NEUTROPHILS RELATIVE PERCENT: 48 %
PLATELET COUNT: 111 10*3/uL — ABNORMAL LOW (ref 150–450)
RED BLOOD CELL COUNT: 2.77 x10E6/uL — ABNORMAL LOW (ref 4.14–5.80)
RED CELL DISTRIBUTION WIDTH: 15 % (ref 11.6–15.4)
WHITE BLOOD CELL COUNT: 1.5 10*3/uL — CL (ref 3.4–10.8)

## 2023-03-20 ENCOUNTER — Inpatient Hospital Stay
Admission: TF | Admit: 2023-03-20 | Discharge: 2023-03-22 | Disposition: A | Discharge status: Home / Self Care | Payer: MEDICARE | Source: Intra-hospital

## 2023-03-20 ENCOUNTER — Ambulatory Visit
Admission: TF | Admit: 2023-03-20 | Discharge: 2023-03-22 | Disposition: A | Discharge status: Home / Self Care | Payer: MEDICARE | Source: Intra-hospital

## 2023-03-20 DIAGNOSIS — Z940 Kidney transplant status: Principal | ICD-10-CM

## 2023-03-20 LAB — RENAL FUNCTION PANEL
ALBUMIN: 3.5 g/dL — ABNORMAL LOW (ref 4.1–5.1)
BLOOD UREA NITROGEN: 24 mg/dL (ref 6–24)
BUN / CREAT RATIO: 13 (ref 9–20)
CALCIUM: 5.7 mg/dL — CL (ref 8.7–10.2)
CHLORIDE: 112 mmol/L — ABNORMAL HIGH (ref 96–106)
CO2: 19 mmol/L — ABNORMAL LOW (ref 20–29)
CREATININE: 1.91 mg/dL — ABNORMAL HIGH (ref 0.76–1.27)
EGFR: 45 mL/min/{1.73_m2} — ABNORMAL LOW
GLUCOSE: 94 mg/dL (ref 70–99)
PHOSPHORUS, SERUM: 2 mg/dL — ABNORMAL LOW (ref 2.8–4.1)
POTASSIUM: 4 mmol/L (ref 3.5–5.2)
SODIUM: 142 mmol/L (ref 134–144)

## 2023-03-20 LAB — BASIC METABOLIC PANEL
ANION GAP: 14 mmol/L (ref 5–14)
BLOOD UREA NITROGEN: 20 mg/dL (ref 9–23)
BUN / CREAT RATIO: 12
CALCIUM: 6.8 mg/dL — ABNORMAL LOW (ref 8.7–10.4)
CHLORIDE: 109 mmol/L — ABNORMAL HIGH (ref 98–107)
CO2: 20.6 mmol/L (ref 20.0–31.0)
CREATININE: 1.63 mg/dL — ABNORMAL HIGH (ref 0.73–1.18)
EGFR CKD-EPI (2021) MALE: 54 mL/min/{1.73_m2} — ABNORMAL LOW (ref >=60)
GLUCOSE RANDOM: 180 mg/dL — ABNORMAL HIGH (ref 70–179)
POTASSIUM: 4.6 mmol/L (ref 3.4–4.8)
SODIUM: 144 mmol/L (ref 135–145)

## 2023-03-20 LAB — COMPREHENSIVE METABOLIC PANEL
ALBUMIN: 3.2 g/dL — ABNORMAL LOW (ref 3.4–5.0)
ALKALINE PHOSPHATASE: 932 U/L — ABNORMAL HIGH (ref 46–116)
ALT (SGPT): <7 U/L — ABNORMAL LOW (ref 10–49)
ANION GAP: 13 mmol/L (ref 5–14)
AST (SGOT): 17 U/L (ref <=34)
BILIRUBIN TOTAL: 1.1 mg/dL (ref 0.3–1.2)
BLOOD UREA NITROGEN: 21 mg/dL (ref 9–23)
BUN / CREAT RATIO: 12
CALCIUM: 6.6 mg/dL — ABNORMAL LOW (ref 8.7–10.4)
CHLORIDE: 108 mmol/L — ABNORMAL HIGH (ref 98–107)
CO2: 22.7 mmol/L (ref 20.0–31.0)
CREATININE: 1.79 mg/dL — ABNORMAL HIGH (ref 0.73–1.18)
EGFR CKD-EPI (2021) MALE: 49 mL/min/{1.73_m2} — ABNORMAL LOW (ref >=60)
GLUCOSE RANDOM: 107 mg/dL (ref 70–179)
POTASSIUM: 4.8 mmol/L (ref 3.4–4.8)
PROTEIN TOTAL: 6.9 g/dL (ref 5.7–8.2)
SODIUM: 144 mmol/L (ref 135–145)

## 2023-03-20 LAB — CBC W/ AUTO DIFF
BASOPHILS ABSOLUTE COUNT: 0 10*9/L (ref 0.0–0.1)
BASOPHILS RELATIVE PERCENT: 0.5 %
EOSINOPHILS ABSOLUTE COUNT: 0 10*9/L (ref 0.0–0.5)
EOSINOPHILS RELATIVE PERCENT: 1.4 %
HEMATOCRIT: 31.5 % — ABNORMAL LOW (ref 39.0–48.0)
HEMOGLOBIN: 10.6 g/dL — ABNORMAL LOW (ref 12.9–16.5)
LYMPHOCYTES ABSOLUTE COUNT: 0.3 10*9/L — ABNORMAL LOW (ref 1.1–3.6)
LYMPHOCYTES RELATIVE PERCENT: 19.8 %
MEAN CORPUSCULAR HEMOGLOBIN CONC: 33.8 g/dL (ref 32.0–36.0)
MEAN CORPUSCULAR HEMOGLOBIN: 35.5 pg — ABNORMAL HIGH (ref 25.9–32.4)
MEAN CORPUSCULAR VOLUME: 105 fL — ABNORMAL HIGH (ref 77.6–95.7)
MEAN PLATELET VOLUME: 8.3 fL (ref 6.8–10.7)
MONOCYTES ABSOLUTE COUNT: 0.2 10*9/L — ABNORMAL LOW (ref 0.3–0.8)
MONOCYTES RELATIVE PERCENT: 15.2 %
NEUTROPHILS ABSOLUTE COUNT: 1 10*9/L — ABNORMAL LOW (ref 1.8–7.8)
NEUTROPHILS RELATIVE PERCENT: 63.1 %
NUCLEATED RED BLOOD CELLS: 1 /100{WBCs} (ref <=4)
PLATELET COUNT: 139 10*9/L — ABNORMAL LOW (ref 150–450)
RED BLOOD CELL COUNT: 3 10*12/L — ABNORMAL LOW (ref 4.26–5.60)
RED CELL DISTRIBUTION WIDTH: 16.5 % — ABNORMAL HIGH (ref 12.2–15.2)
WBC ADJUSTED: 1.5 10*9/L — ABNORMAL LOW (ref 3.6–11.2)

## 2023-03-20 LAB — MAGNESIUM
MAGNESIUM: 1.2 mg/dL — ABNORMAL LOW (ref 1.6–2.3)
MAGNESIUM: 1.1 mg/dL — ABNORMAL LOW (ref 1.6–2.6)
MAGNESIUM: 1 mg/dL — ABNORMAL LOW (ref 1.6–2.6)

## 2023-03-20 LAB — TACROLIMUS LEVEL: TACROLIMUS BLOOD: 5.5 ng/mL (ref 2.0–20.0)

## 2023-03-20 LAB — IONIZED CALCIUM VENOUS
CALCIUM IONIZED VENOUS (MG/DL): 3.4 mg/dL — ABNORMAL LOW (ref 4.40–5.40)
CALCIUM IONIZED VENOUS (MG/DL): 3.79 mg/dL — ABNORMAL LOW (ref 4.40–5.40)

## 2023-03-20 LAB — PHOSPHORUS: PHOSPHORUS: 1.7 mg/dL — ABNORMAL LOW (ref 2.4–5.1)

## 2023-03-20 NOTE — Unmapped (Signed)
C/o everything is tingling and he feels numb. States since last night it feels like something is biting him on the back of his left shoulder. Still having some issues swallowing food.

## 2023-03-20 NOTE — Unmapped (Signed)
Reviewed recent labs with [name], pt advised to increase his calcitriol dose to 1 mcg bid and to take 4 TUMS now.  Pt advised due to low calcium and magnesium levels, he should go to the ED for evaluation.  Pt reports he has a follow up appt with Dr. Selena Batten today at the [facility] clinic and he would prefer to go to his appt and see Dr. Selena Batten.  Pt will get repeat labs at [facility] when he is there.  Pt will also increase his magnesium dose to 3 gummies daily.

## 2023-03-20 NOTE — Unmapped (Signed)
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 Most recently, patient underwent right inferior parathyroidectomy with parathyroid tissue cryopreservation on 03/14/23 who presents for post operative visit.  Patient states that he has had significant paresthesias in his upper and lower extremities since the time of surgery.  His pain is controlled and he denies incisional drainage or swelling.      Patient's most recent lab work on 03/19/2023 was significant for history of calcium 5.7, Mg 1.2.  Patient was recommended to present to the emergency department for calcium repletion, but he declined the recommendation.  Patient previously was taking 2 Tums and 2 calcitriol tablets per day. Prior to today's visit, he took 8 tums and 2 calcitriol capsules.     Objective:   Physical Exam   General: Well appearing, no acute distress  HEENT: Incision c/d/I with steri strips overlying.   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 Most recently, patient underwent right inferior parathyroidectomy with parathyroid tissue cryopreservation on 03/14/23 who presents for post operative visit.  Patient found to have significant hypocalcemia which is likely the source of his bilateral upper and lower extremity paresthesias.  We have made adjustments to patient's home calcitriol and Tums regimen.  IV calcium gluconate was administered at the clinic.  We will plan to see patient again in 2 weeks with repeat lab work after making changes to home regimen.    PLAN  -Increase calcitriol to 2 caps and BID   -Increase to 4 TUMs four times daily   -2g IV Ca gluc administered in clinic   -RTC in 2 weeks

## 2023-03-21 LAB — IONIZED CALCIUM VENOUS
CALCIUM IONIZED VENOUS (MG/DL): 3.64 mg/dL — ABNORMAL LOW (ref 4.40–5.40)
CALCIUM IONIZED VENOUS (MG/DL): 4.09 mg/dL — ABNORMAL LOW (ref 4.40–5.40)

## 2023-03-21 LAB — BASIC METABOLIC PANEL
ANION GAP: 13 mmol/L (ref 5–14)
BLOOD UREA NITROGEN: 23 mg/dL (ref 9–23)
BUN / CREAT RATIO: 13
CALCIUM: 6.4 mg/dL — ABNORMAL LOW (ref 8.7–10.4)
CHLORIDE: 108 mmol/L — ABNORMAL HIGH (ref 98–107)
CO2: 23 mmol/L (ref 20.0–31.0)
CREATININE: 1.79 mg/dL — ABNORMAL HIGH (ref 0.73–1.18)
EGFR CKD-EPI (2021) MALE: 49 mL/min/{1.73_m2} — ABNORMAL LOW (ref >=60)
GLUCOSE RANDOM: 92 mg/dL (ref 70–179)
POTASSIUM: 4.8 mmol/L (ref 3.5–5.1)
SODIUM: 144 mmol/L (ref 135–145)

## 2023-03-21 LAB — FOLATE: FOLATE: 7.1 ng/mL (ref >=5.4)

## 2023-03-21 LAB — VITAMIN B12: VITAMIN B-12: 485 pg/mL (ref 211–911)

## 2023-03-21 LAB — MAGNESIUM: MAGNESIUM: 1.8 mg/dL (ref 1.6–2.6)

## 2023-03-21 LAB — PHOSPHORUS: PHOSPHORUS: 3 mg/dL (ref 2.4–5.1)

## 2023-03-21 MED ADMIN — tacrolimus (PROGRAF) capsule 10 mg: 10 mg | ORAL | @ 05:00:00

## 2023-03-21 MED ADMIN — magnesium oxide (MAG-OX) tablet 400 mg: 400 mg | ORAL | Stop: 2023-03-20

## 2023-03-21 MED ADMIN — minoxidil (LONITEN) tablet 2.5 mg: 2.5 mg | ORAL | @ 15:00:00

## 2023-03-21 MED ADMIN — calcium carbonate (TUMS) chewable tablet 800 mg elem calcium: 800 mg | ORAL | @ 22:00:00

## 2023-03-21 MED ADMIN — carvedilol (COREG) tablet 12.5 mg: 12.5 mg | ORAL | @ 09:00:00 | Stop: 2023-03-21

## 2023-03-21 MED ADMIN — magnesium sulfate 2gm/50mL IVPB: 2 g | INTRAVENOUS | @ 05:00:00

## 2023-03-21 MED ADMIN — bictegrav-emtricit-tenofov ala (BIKTARVY) 50-200-25 mg tablet 1 tablet: 1 | ORAL | @ 11:00:00

## 2023-03-21 MED ADMIN — predniSONE (DELTASONE) tablet 5 mg: 5 mg | ORAL | @ 14:00:00

## 2023-03-21 MED ADMIN — calcitriol (ROCALTROL) capsule 0.5 mcg: .5 ug | ORAL | @ 14:00:00

## 2023-03-21 MED ADMIN — calcium carbonate (TUMS) chewable tablet 800 mg elem calcium: 800 mg | ORAL | @ 11:00:00

## 2023-03-21 MED ADMIN — magnesium sulfate 2gm/50mL IVPB: 2 g | INTRAVENOUS | @ 08:00:00 | Stop: 2023-03-21

## 2023-03-21 MED ADMIN — calcium gluconate in sodium chloride (NS) 0.9% 2 gram/100 mL IVPB 2 g: 2 g | INTRAVENOUS | Stop: 2023-03-20

## 2023-03-21 MED ADMIN — calcium carbonate (TUMS) chewable tablet 800 mg elem calcium: 800 mg | ORAL | @ 06:00:00

## 2023-03-21 MED ADMIN — calcitriol (ROCALTROL) capsule 0.5 mcg: .5 ug | ORAL | @ 06:00:00

## 2023-03-21 MED ADMIN — pantoprazole (Protonix) EC tablet 40 mg: 40 mg | ORAL | @ 14:00:00

## 2023-03-21 MED ADMIN — calcium carbonate (TUMS) chewable tablet 800 mg elem calcium: 800 mg | ORAL | @ 17:00:00

## 2023-03-21 MED ADMIN — carvedilol (COREG) tablet 12.5 mg: 12.5 mg | ORAL | @ 22:00:00

## 2023-03-21 MED ADMIN — calcium gluconate in sodium chloride (NS) 0.9% 2 gram/100 mL IVPB 2 g: 2 g | INTRAVENOUS | @ 17:00:00 | Stop: 2023-03-21

## 2023-03-21 MED ADMIN — tacrolimus (PROGRAF) capsule 10 mg: 10 mg | ORAL | @ 14:00:00

## 2023-03-21 NOTE — Unmapped (Signed)
Patient arrives to ED from onc clinic (kidney transplant patient) for abnormal labs, with calcium of 5.7.   Pt notes a recent parathyroidectomy on Wednesday.   States he's been having tingling in hands and legs since the surgery.

## 2023-03-21 NOTE — Unmapped (Signed)
Pt coming in from clinic for abnormal lab work that resulted today. Please see chart review.

## 2023-03-21 NOTE — Unmapped (Signed)
[facility]   History and Physical       Assessment and Plan     Christian Brown is a 43 y.o. male who is presenting to [facility] with Disorder of electrolytes, in the setting of the following pertinent/contributing co-morbidities: s/p parathyroidectomy (08/2022, 02/2023), ESRD on HD, kidney transplant.    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 Will need to correct magnesium and phos as well as calcium. We do not have endocrine at [facility] but endocrine surgery provided recs and nephrology will consult/assist with management. Symptoms include paresthesias, cramps. No other or severe symptoms.   PLAN  - Increase home calcitriol to 2 caps BID  - Increase tums to 4 QID  - Continue to replete calcium with IV calcium gluconate prn  - Correct hypomagnesemia and hypophosphetema  - Monitor for worsening symptoms of electrolyte derangements, telemetry      Hx hyperparathyroidism  S/p parathyroidectomy  Underwent partial parathyroidectomy 08/2022 and then remaining gland removal with tissue cryopreservation on 03/14/2023 in setting of persistently elevated PTH/hypercalcemia. Followed up with his surgical team 1/21 and sent to ED for electrolyte derangements but healing well.   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 Cr now stabilizing around 1.5-2 again. Renal aware of admission and approved him to stay at [facility]   - Renal consult in AM for assistance  - Continue tacrolimus and azathioprine    Secondary/Additional Active Problems:    HIV infection   - Continue biktarvy    Mild intermittent asthma without complication   - PRN albuterol    Renovascular hypertension   - Continue coreg (12.5 BID, not 25 BID)    Iron deficiency anemia  History of anemia due to CKD  Hgb baseline 10-11.   - CTM    GERD  Hx GI bleeding  - Continue home protonix    Pancytopenia, chronic  - Protective precautions ordered, monitor cbc with diff q48  - Receives filgrastim injections per renal transplant team      Prophylaxis  -Ambulation, SCDs    Diet  -Nutrition Therapy Regular/House    Code Status / HCDM   -Full Code, Unverified but presumed, based on previous history   -  HCDM (patient stated preference): Christian Brown - Mother - [phone]    Anticipated Medically Ready for Discharge: Anticipated in 2-4 Days    Significant Comorbid Conditions:     -Thrombocytopenia POA requiring further investigation or monitor  -Anemia POA requiring further investigation or monitoring  -Chronic kidney disease POA requiring further investigation, treatment, or monitoring  - Hypocalcemia, hypomagnesemia, hypophosphatemia requiring further investigation, treatment, or monitoring    Issues Impacting Complexity of Management:  -Need for the following intensive monitoring parameter(s) due to high risk of clinical decline: telemetry    Medical Decision Making: Reviewed records from the following unique sources: Care Everywhere, OP renal and endocrine surgery notes.    I personally spent greater than 90 minutes face-to-face and non-face-to-face in the care of this patient, which includes all pre, intra, and post visit time on the date of service.  All documented time was specific to the E/M visit and does not include  any procedures that may have been performed.    Christian Lean, MD  Hospital Medicine     HPI      Christian Brown is a 43 y.o. male who is presenting to [facility] with Disorder of electrolytes.    Tolerated surgery well and felt like he was recovering well at home. However has had paraesthesias in his hands/feet - feels like tingling, pins and needles. Some muscle cramps, fatigue. Moving bowels - looser stools but large volume or super frequent. Making urine, voiding without difficulty. Eating/drinking well overall. Labs checked 1/20 and meds adjusted by phone by providers. They recommended ED but he wanted to go to surgery follow up visit 1/21 and agreed to repeat labs at that time. He got repeat labs 1/22 as planned and then after follow up visit he came to ED for electrolyte management. He states his treatment teams were aware of [facility] ED/admission here and okay with it. He has been compliant with all his medications.      Med Rec Confidence   I reviewed the Medication List. The current list is Accurate other than coreg. Dose is 12.5 BID, no longer 25 BID.    Physical Exam   Temp:  [36.7 ??C (98.1 ??F)-37.6 ??C (99.7 ??F)] 36.7 ??C (98.1 ??F)  Heart Rate:  [77-100] 97  SpO2 Pulse:  [97] 97  Resp:  [14-20] 18  BP: (112-145)/(81-96) 138/88  SpO2:  [99 %-100 %] 99 %  There is no height or weight on file to calculate BMI.  Well appearing man sleeping, NAD  White sclera, MMM, normal hearing  Breathing is non-labored, on room air  Heart rate is normal, regular sinus rhythm on telemetry  A&Ox4, nonfocal  Calm, appropriate

## 2023-03-21 NOTE — Unmapped (Signed)
Transplant Nephrology Consult     Requesting Attending Physician :  Suzzanne Cloud, MD  Service Requesting Consult : Med Undesignated (MDX)  Reason for Consult: Hypocalcemia and Hx of DDKT    Assessment and Plan:   Patient is a 43y/o M with Past Medical history of ESRD s/p DDKT and Tertiary hyperthyroidism s/p resection here for hypocalcemia    # S/p Kidney Transplant, Kidney allograft function:  # Immunosuppression:  - Patient with stable renal function to 1.6-1.7 recently had allograft biopsy performed results unavailable has follow up with transplant next week.   - AZA 150mg  daily Prednisone 5mg  daily   - Tacrolimus 10/10 goal trough 6-8  - Please obtain Daily Tacrolimus trough levels prior to the morning dose of the medication.    # Tertieary Hyperparathyroidism s/p resection  #Hypocalcemia 2/2 Hungry bone syndrome  - improvement in Ca after Ca gluconate infusion in the ED  - Agree with Additional IV Ca today  - would recommend going up on Calcitriol to BID  - would recommend that he receives his oral Ca in between meals  - continue to monitor Ionized Ca  - Evaluation and management per primary team  - No changes to management from a nephrology standpoint at this time    RECOMMENDATIONS:   - increase Calcitriol to BID  - continue FK dose 10mg  BID  - obtain Daily tacrolimus trough levels  -- We will continue to follow.     Dudley Major, MD  03/21/2023 10:33 AM     Medical decision-making for 03/21/23  Findings / Data     Patient has: []  acute illness w/systemic sxs  [mod]  []  two or more stable chronic illnesses [mod]  []  one chronic illness with acute exacerbation [mod]  []  acute complicated illness  [mod]  []  Undiagnosed new problem with uncertain prognosis  [mod] [x]  illness posing risk to life or bodily function (ex. AKI)  [high]  []  chronic illness with severe exacerbation/progression  [high]  []  chronic illness with severe side effects of treatment  [high] Severe Hypocalcemia Probs At least 2:  Probs, Data, Risk   I reviewed: []  primary team note  []  consultant note(s)  []  external records []  chemistry results  []  CBC results  []  blood gas results  []  Other []  procedure/op note(s)   []  radiology report(s)  []  micro result(s)  []  w/ independent historian(s) Ca 5.7 Cr stable  >=3 Data Review (2 of 3)    I independently interpreted: []  Urine Sediment  []  Renal US []  CXR Images  []  CT Images  []  Other []  EKG Tracing  Any     I discussed: []  Pathology results w/ QHPs(s) from other specialties  []  Procedural findings w/ QHPs(s) from other specialties []  Imaging w/ QHP(s) from other specialties  [x]  Treatment plan w/ QHP(s) from other specialties Plan discussed with primary team Any     Mgm't requires: []  Prescription drug(s)  [mod]  []  Kidney biopsy  [mod]  []  Central line placement  [mod] [x]  High risk medication use and/or intensive toxicity monitoring [high]  []  Renal replacement therapy [high]  []  High risk kidney biopsy  [high]  []  Escalation of care  [high]  []  High risk central line placement  [high] Immunosuppression: high risk for infection Risk      _____________________________________________________________________________________    Transplant Background  Date of Transplant: 09/05/2022 (Kidney)  Type of Transplant: Deceased  KDPI: 18%  Cold ischemic time: 972 minutes  Warm ischemic time: 40 minutes  cPRA: 7%  Blood type: Donor O, Recipient O POS  ID: CMV: D-/R+ EBV: D+/R+   Zero-Hour Biopsy: Not done  Native Kidney Disease: cFSGS/HIVAN (biopsy proven)  Pre-transplant dialysis course: Was on hemodialysis since 02/11/2015  Prior Transplants: None    History of Present Illness:  Christian Brown is a/an 43 y.o. 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 Nephrology has been consulted for Hypocalcemia and Hx of renal Transplant.     Patient presenting to the hospital due to paresthesias. Patient was in clinic after parathyroidectomy for hypercalcemia and found to have significantly lower Ca of 5.7 Patient had been taking 2 tabs of tums and 2 calcitriol tabs a day. Patient was started on Ca gluconate with improvement in symptoms however still has numbness and tingling to his fingers. Calcium is still low    INPATIENT MEDICATIONS:    Current Facility-Administered Medications:     albuterol (PROVENTIL HFA;VENTOLIN HFA) 90 mcg/actuation inhaler 2 puff, Inhalation, Q6H PRN    azathioprine (IMURAN) tablet 150 mg, Oral, Daily    bictegrav-emtricit-tenofov ala (BIKTARVY) 50-200-25 mg tablet 1 tablet, Oral, Q AM    calcitriol (ROCALTROL) capsule 0.5 mcg, Oral, BID    calcium carbonate (TUMS) chewable tablet 800 mg elem calcium, Oral, QID    calcium gluconate in sodium chloride (NS) 0.9% 2 gram/100 mL IVPB 2 g, Intravenous, Once    carvedilol (COREG) tablet 12.5 mg, Oral, BID    HYDROcodone-acetaminophen (NORCO) 5-325 mg per tablet 1 tablet, Oral, Daily PRN    minoxidil (LONITEN) tablet 2.5 mg, Oral, Daily    pantoprazole (Protonix) EC tablet 40 mg, Oral, BID    polyethylene glycol (MIRALAX) packet 17 g, Oral, Daily    potassium & sodium phosphates 250mg  (PHOS-NAK/NEUTRA PHOS) packet 2 packet, Oral, Once    potassium phosphate (monobasic) (K-PHOS) tablet 1,000 mg, Oral, Once PRN    predniSONE (DELTASONE) tablet 5 mg, Oral, Daily    senna (SENOKOT) tablet 2 tablet, Oral, Nightly    tacrolimus (PROGRAF) capsule 10 mg, Oral, BID  OUTPATIENT MEDICATIONS:  Prior to Admission medications    Medication Dose, Route, Frequency   albuterol HFA 90 mcg/actuation inhaler Inhale 1-2 puffs every four (4) to six (6) hours as needed for wheezing or shortness of breath.   azathioprine (AZASAN) 100 mg tablet 100 mg, Oral, Daily (standard), Take in addition to 50 mg tablet for a total daily dose of 150 mg daily bictegrav-emtricit-tenofov ala (BIKTARVY) 50-200-25 mg tablet 1 tablet, Oral, Every morning   calcitriol (ROCALTROL) 0.5 MCG capsule 1 mcg, Oral, Daily (standard)   calcium carbonate (TUMS) 200 mg calcium (500 mg) chewable tablet 400 mg elem calcium, Oral, 2 times a day (standard)   carvedilol (COREG) 12.5 MG tablet 25 mg, Oral, 2 times a day (standard)   DIETARY SUPPLEMENT,MISC COMB14 ORAL Taking magnesium gummy supplement once daily. Dosage strength & product unknown   HYDROcodone-acetaminophen (NORCO) 5-325 mg per tablet 1 tablet, Daily PRN   minoxidil (LONITEN) 2.5 MG tablet 2.5 mg, Oral, Daily (standard)   oxyCODONE (ROXICODONE) 10 mg immediate release tablet Take 1 tablet (10 mg total) by mouth every six (6) hours as needed.   predniSONE (DELTASONE) 5 MG tablet 5 mg, Oral, Daily   tacrolimus (PROGRAF) 1 MG capsule Take 10 capsules (10 mg  total) by mouth in the morning and take 10 capsules (10 mg total) in the evenings.   atovaquone (MEPRON) 750 mg/5 mL suspension 1,500 mg, Oral, Daily (standard)  Patient not taking: Reported on 03/21/2023   azathioprine (IMURAN) 50 mg tablet 50 mg, Oral, Daily (standard), Take in addition to 100 mg tablet for a total daily dose to of 150 mg daily   diphenhydrAMINE (BENADRYL) 25 mg capsule 25 mg, Oral, Every 6 hours PRN  Patient not taking: Reported on 03/21/2023   empty container Misc Use as directed with Zarxio  Patient not taking: Reported on 03/21/2023   filgrastim-sndz (ZARXIO) 480 mcg/0.8 mL Syrg 480 mcg, Subcutaneous, Weekly, Administer only as directed by transplant team.  Patient not taking: Reported on 03/21/2023   pantoprazole (PROTONIX) 40 MG tablet 40 mg, Oral, 2 times a day (standard)   senna (SENOKOT) 8.6 mg tablet 1 tablet, Oral, Daily (standard)  Patient not taking: Reported on 03/21/2023      ALLERGIES:  Amlodipine, Doxercalciferol, Fish containing products, Iron sucrose, Labetalol, Metoclopramide, Metoprolol, Nifedipine, Other, Sulfamethoxazole-trimethoprim, Vancomycin, Calcitonin, Peanut oil, Potassium clavulanate, Azathioprine, Chlorhexidine, Clindamycin, Hydralazine, and Sensipar [cinacalcet]  MEDICAL HISTORY:  Past Medical History:   Diagnosis Date    Acquired immunodeficiency syndrome (CMS-HCC) 11/14/2013    Formatting of this note might be different from the original.   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   He appears to be doing well. States he is back to 90% except for his leg swelling.    Will change his epivir to 150 mg daily when he completes his liquid rx. Will see him back in 1 month and repeat his labs th    Acute deep vein thrombosis (DVT) of popliteal vein of left lower extremity (CMS-HCC) 02/05/2019    Last Assessment & Plan:   Formatting of this note might be different from the original.  Moderate effusion left knee new the some limitation of motion.  No evidence of sepsis the white count the normal  will     Proceed on with an aspiration for cell count, culture and crystal analysis.  New x-rays ordered and currently pending will follow.  Further therapeutic recommendations contingent on the re    Anemia     Asthma     Candidal esophagitis (CMS-HCC) 07/30/2022    Community acquired pneumonia 07/30/2014    DVT (deep venous thrombosis) (CMS-HCC) 11/24/2013    Last Assessment & Plan:    Formatting of this note might be different from the original.   Appears to be doing better (swelling improved). He will f/u with PCP regarding coumadin dosing (INR is low).  Formatting of this note might be different from the original.   Last Assessment & Plan:    Formatting of this note might be different from the original.   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   greatly appreciate renal f/u.  Formatting of this note might be different from the original.   Last Assessment & Plan:    greatly appreciate renal f/u.   Last Assessment & Plan:    greatly appreciate renal f/u.  Formatting of this note  might be different from the original.   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    Rectal bleeding 04/23/2019    Sleep apnea      Past Surgical History:   Procedure Laterality Date    INTUSSUSCEPTION REPAIR      PERICARDIAL WINDOW      PR AUTOTRANSPLANT, PARATHYROID Bilateral 06/17/2019    Procedure: PARATHYROID AUTOTRANSPLANTATION;  Surgeon: Karren Burly, MD;  Location: MAIN OR [place];  Service: ENT    PR CREAT AV FISTULA,NON-AUTOGENOUS GRAFT Left 07/05/2022    Procedure: CREATE AV FISTULA (SEPARATE PROC); NONAUTOGENOUS GRAFT (EG, BIOLOGICAL COLLAGEN, THERMOPLASTIC GRAFT), UPPER EXTREMITY;  Surgeon: Toledo, Lilyan Punt, MD;  Location: MAIN OR [facility];  Service: Transplant    PR EXCISION TURBINATE,SUBMUCOUS Bilateral 10/22/2020    Procedure: SUBMUCOUS RESECTION INFERIOR TURBINATE, PARTIAL OR COMPLETE, ANY METHOD;  Surgeon: Maeola Sarah, MD;  Location: OR [facility];  Service: ENT    PR EXPLORE PARATHYROID GLANDS Bilateral 06/17/2019    Procedure: PARATHYROIDECTOMY OR EXPLORATION OF PARATHYROID(S);  Surgeon: Karren Burly, MD;  Location: MAIN OR [facility];  Service: ENT    PR RE-EXPLORE PARATHYROIDS N/A 03/14/2023    Procedure: PARATHYROIDECTOMY OR EXPLORATION OF PARATHYROID(S); RE-EXPLORATION;  Surgeon: Johny Chess, MD;  Location: OR [facility];  Service: Surgical Oncology    PR REPAIR OF NASAL SEPTUM N/A 10/22/2020    Procedure: SEPTOPLASTY OR SUBMUCOUS RESECTION, WITH OR WITHOUT CARTILAGE SCORING, CONTOURING OR REPLACEMENT WITH GRAFT;  Surgeon: Maeola Sarah, MD;  Location: OR [facility];  Service: ENT    PR REVISE AV FISTULA,W/O THROMBECTOMY Left 06/06/2022    Procedure: REVISON, OPEN, ARTERIOVENOUS FISTULA; WITHOUT THROMBECTOMY, AUTOGENOUS OR NONAUTOGENOUS DIALYSIS GRAFT;  Surgeon: Toledo, Lilyan Punt, MD;  Location: MAIN OR [facility];  Service: Transplant    PR REVISE AV FISTULA,W/O THROMBECTOMY Left 07/05/2022    Procedure: REVISON, OPEN, ARTERIOVENOUS FISTULA; WITHOUT THROMBECTOMY, AUTOGENOUS OR NONAUTOGENOUS DIALYSIS GRAFT;  Surgeon: Toledo, Lilyan Punt, MD;  Location: MAIN OR [facility];  Service: Transplant    PR RIGHT HEART CATH O2 SATURATION & CARDIAC OUTPUT N/A 07/20/2020    Procedure: Right Heart Catheterization with PULMONARY ANGIOGRAM;  Surgeon: Marlaine Hind, MD;  Location: [facility] CATH;  Service: Cardiology    PR TRANSPLANT,PREP RENAL GRAFT/ARTERIAL N/A 09/05/2022    Procedure: [facility] RECONSTRUCTION CADAVER/LIVING DONOR RENAL ALLOGRAFT PRIOR TO TRANSPLANT; ARTERIAL ANASTOMOSIS EAC;  Surgeon: Toledo, Lilyan Punt, MD;  Location: MAIN OR [facility];  Service: Transplant    PR TRANSPLANTATION OF KIDNEY N/A 09/05/2022    Procedure: RENAL ALLOTRANSPLANTATION, IMPLANTATION OF GRAFT; WITHOUT RECIPIENT NEPHRECTOMY;  Surgeon: Toledo, Lilyan Punt, MD;  Location: MAIN OR [facility];  Service: Transplant    PR UPPER GI ENDOSCOPY,BIOPSY N/A 07/31/2022    Procedure: UGI ENDOSCOPY; WITH BIOPSY, SINGLE OR MULTIPLE;  Surgeon: Janace Aris, MD;  [facility]: [facility];  Service: Gastroenterology    PR UPPER GI ENDOSCOPY,BIOPSY N/A 09/12/2022    Procedure: UGI ENDOSCOPY; WITH BIOPSY, SINGLE OR MULTIPLE;  Surgeon: Zetta Bills, MD;  [facility]: [facility];  Service: Gastroenterology  UMBILICAL HERNIA REPAIR       SOCIAL HISTORY  Social History     Social History Narrative    Not on file      reports that he has never smoked. He has never used smokeless tobacco. He reports that he does not currently use alcohol. He reports that he does not use drugs.   FAMILY HISTORY  Family History   Problem Relation Age of Onset    Hypertension Mother     Arthritis Mother     Hypertension Father         Physical Exam:   Vitals:    03/21/23 0320 03/21/23 0328 03/21/23 0407 03/21/23 1015   BP:  161/107  144/88   Pulse:  78 67 86   Resp:  19  18   Temp:  36 ??C (96.8 ??F)     TempSrc:  Oral     SpO2:  99%  98%   Weight: 81.1 kg (178 lb 14.4 oz)      Height: 170.2 cm (5' 7)        No intake/output data recorded.    Intake/Output Summary (Last 24 hours) at 03/21/2023 1033  Last data filed at 03/21/2023 0630  Gross per 24 hour   Intake 50 ml   Output 480 ml   Net -430 ml       Constitutional: well-appearing, no acute distress  Heart: regular rate and rhythm, no murmurs, rubs, or gallops  Lungs: clear to auscultation bilaterally without adventitious sounds  Abd: soft, non-tender, non-distended  Ext: No edema

## 2023-03-21 NOTE — Unmapped (Signed)
Tacrolimus Therapeutic Monitoring Pharmacy Note    Christian Brown is a 43 y.o. male continuing tacrolimus.     Indication: Kidney transplant     Date of Transplant:  September 05, 2022       Prior Dosing Information: Home regimen 10 mg PO BID      Goals:  Therapeutic Drug Levels  Tacrolimus trough goal:  6-8 ng/mL    Additional Clinical Monitoring/Outcomes  Monitor renal function (SCr and urine output) and liver function (LFTs)  Monitor for signs/symptoms of adverse events (e.g., hyperglycemia, hyperkalemia, hypomagnesemia, hypertension, headache, tremor)    Results:   Tacrolimus level: Not applicable    Pharmacokinetic Considerations and Significant Drug Interactions:  Concurrent hepatotoxic medications: None identified  Concurrent CYP3A4 substrates/inhibitors: None identified  Concurrent nephrotoxic medications: None identified    Assessment/Plan:  Recommendedation(s)  Continue current regimen of 10 mg PO BID    Follow-up  Next level to be determined by primary team.   A pharmacist will continue to monitor and recommend levels as appropriate    Please page service pharmacist with questions/clarifications.    Thank you for allowing me to participate in the care of this patient.   Santa Genera, PharmD

## 2023-03-21 NOTE — Unmapped (Signed)
Pt came from ED and admitted here for abnormal electrolytes. A&Ox4, RA, afebrile and VSS during this shift. No called from tele for acute changes. Denies pain or discomfort. Falls and injuries free. Plan of care ongoing.    Problem: Adult Inpatient Plan of Care  Goal: Plan of Care Review  Outcome: Ongoing - Unchanged  Flowsheets (Taken 03/21/2023 0640)  Progress: no change  Plan of Care Reviewed With: patient  Goal: Patient-Specific Goal (Individualized)  Outcome: Ongoing - Unchanged  Flowsheets (Taken 03/21/2023 0640)  Patient/Family-Specific Goals (Include Timeframe): Pt's labs will be WNL prior to discharge 03/23/23  Individualized Care Needs: Monitor labs and VS  Anxieties, Fears or Concerns: No concerns  Goal: Absence of Hospital-Acquired Illness or Injury  Intervention: Identify and Manage Fall Risk  Recent Flowsheet Documentation  Taken 03/21/2023 0600 by Hillery Aldo, RN  Safety Interventions:   low bed   isolation precautions   nonskid shoes/slippers when out of bed  Taken 03/21/2023 0400 by Hillery Aldo, RN  Safety Interventions:   isolation precautions   low bed   nonskid shoes/slippers when out of bed  Taken 03/21/2023 0333 by Hillery Aldo, RN  Safety Interventions:   low bed   isolation precautions   nonskid shoes/slippers when out of bed  Intervention: Prevent Skin Injury  Recent Flowsheet Documentation  Taken 03/21/2023 0600 by Hillery Aldo, RN  Positioning for Skin: Standing  Taken 03/21/2023 0400 by Hillery Aldo, RN  Positioning for Skin: Supine/Back  Taken 03/21/2023 1610 by Hillery Aldo, RN  Positioning for Skin: Standing  Intervention: Prevent and Manage VTE (Venous Thromboembolism) Risk  Recent Flowsheet Documentation  Taken 03/21/2023 0600 by Hillery Aldo, RN  Anti-Embolism Device Status: Refused  Taken 03/21/2023 0400 by Hillery Aldo, RN  Anti-Embolism Device Status: Refused  Taken 03/21/2023 0333 by Hillery Aldo, RN  Anti-Embolism Device Status: Refused

## 2023-03-21 NOTE — Unmapped (Signed)
[facility]  Emergency Department Provider Note        ED Clinical Impression     Final diagnoses:   Hypocalcemia (Primary)   Hypomagnesemia   Hungry bone syndrome       ED Assessment/Plan   This is a 43 y.o. 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 Pt is asymptomatic.    On exam, the patient appears alert and in no acute distress. Hemodynamically stable and afebrile. Physical exam revealed heart rate and rhythm regular. Lungs clear to auscultation bilaterally. Abdomen soft and non tender. No guarding or rebound. Surgical incision site is clean, dry, and intact. Trace pedal edema.     Metabolic abnormalities likely due to recent parathyroid resection.    Plan for basic labs, magnesium, and ionized calcium. Will give calcium gluconate and magnesium supplementation per referring provider's recommendations.    938 PM  I received a message from the patient's transplant nephrologist [name] who recommends admission for further treatment and evaluation.  MAO paged for admission.  Patient updated with plan and is in agreement.  Calcium is uptrending slightly but is still not within normal limits.  Will defer to admitting team for further treatment.  Patient's home medications been ordered per his nephrologist recommendations.     Discussion of Management with other Physicians, QHP or Appropriate Source: Spoke to Surgical Oncology prior to patient transfer to ED for their recommendations.  Spoke with his transplant nephrologist as detailed above.  Independent Interpretation of Studies: EKG N/A; RAD N/A; POCUS N/A  External Records Reviewed: Patient's most recent outpatient clinic note - 03/20/2023 Surgical Oncology Office Visit: Reviewed patient's medical history and initial evaluation today, as detailed below in the HPI.   Escalation of Care, Consideration of Admission/Observation/Transfer: Plan for admission as detailed above.  Social determinants that significantly affected care: None applicable  Prescription drug(s) considered but not prescribed: N/A  Diagnostic tests considered but not performed: N/A  History obtained from other sources: None          History     Chief Complaint   Patient presents with    Abnormal Lab       Kosta Schnitzler is a 43 y.o. 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 The patient reports he was seen today for follow up regarding his recent parathyroidectomy (performed 03/14/2023), however his labs were remarkable for hypocalcemia, thus they recommended he present to the ED for calcium repletion. He does endorses some paraesthesias to his bilateral hands since the surgery, but has otherwise been feeling well. He denies headaches, nausea, emesis, chest pain, shortness of breath, diarrhea, constipation, dysuria, or hematuria.    Past Medical History:   Diagnosis Date    Acquired immunodeficiency syndrome (CMS-HCC) 11/14/2013    Formatting of this note might be different from the original.   HLA (-), genotype naive 10-2013      Last Assessment & Plan:  Formatting of this note might be different from the original.   He appears to be doing well. States he is back to 90% except for his leg swelling.    Will change his epivir to 150 mg daily when he completes his liquid rx. Will see him back in 1 month and repeat his labs th    Acute deep vein thrombosis (DVT) of popliteal vein of left lower extremity (CMS-HCC) 02/05/2019    Last Assessment & Plan:   Formatting of this note might be different from the original.  Moderate effusion left knee new the some limitation of motion.  No evidence of sepsis the white count the normal  will     Proceed on with an aspiration for cell count, culture and crystal analysis.  New x-rays ordered and currently pending will follow.  Further therapeutic recommendations contingent on the re    Anemia     Asthma     Candidal esophagitis (CMS-HCC) 07/30/2022    Community acquired pneumonia 07/30/2014    DVT (deep venous thrombosis) (CMS-HCC) 11/24/2013    Last Assessment & Plan:    Formatting of this note might be different from the original.   Appears to be doing better (swelling improved). He will f/u with PCP regarding coumadin dosing (INR is low).  Formatting of this note might be different from the original.   Last Assessment & Plan:    Formatting of this note might be different from the original.   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   greatly appreciate renal f/u.  Formatting of this note might be different from the original.   Last Assessment & Plan:    greatly appreciate renal f/u.   Last Assessment & Plan:    greatly appreciate renal f/u.  Formatting of this note might be different from the original.   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    Rectal bleeding 04/23/2019    Sleep apnea        Past Surgical History:   Procedure Laterality Date    INTUSSUSCEPTION REPAIR      PERICARDIAL WINDOW      PR AUTOTRANSPLANT, PARATHYROID Bilateral 06/17/2019    Procedure: PARATHYROID AUTOTRANSPLANTATION;  Surgeon: Karren Burly, MD;  Location: MAIN OR [place];  Service: ENT    PR CREAT AV FISTULA,NON-AUTOGENOUS GRAFT Left 07/05/2022    Procedure: CREATE AV FISTULA (SEPARATE PROC); NONAUTOGENOUS GRAFT (EG, BIOLOGICAL COLLAGEN, THERMOPLASTIC GRAFT), UPPER EXTREMITY;  Surgeon: Toledo, Lilyan Punt, MD;  Location: MAIN OR [facility];  Service: Transplant    PR EXCISION TURBINATE,SUBMUCOUS Bilateral 10/22/2020    Procedure: SUBMUCOUS RESECTION INFERIOR TURBINATE, PARTIAL OR COMPLETE, ANY METHOD;  Surgeon: Maeola Sarah, MD;  Location: OR [facility];  Service: ENT    PR EXPLORE PARATHYROID GLANDS Bilateral 06/17/2019  Procedure: PARATHYROIDECTOMY OR EXPLORATION OF PARATHYROID(S);  Surgeon: Karren Burly, MD;  Location: MAIN OR [facility];  Service: ENT    PR RE-EXPLORE PARATHYROIDS N/A 03/14/2023    Procedure: PARATHYROIDECTOMY OR EXPLORATION OF PARATHYROID(S); RE-EXPLORATION;  Surgeon: Johny Chess, MD;  Location: OR [facility];  Service: Surgical Oncology    PR REPAIR OF NASAL SEPTUM N/A 10/22/2020    Procedure: SEPTOPLASTY OR SUBMUCOUS RESECTION, WITH OR WITHOUT CARTILAGE SCORING, CONTOURING OR REPLACEMENT WITH GRAFT;  Surgeon: Maeola Sarah, MD;  Location: OR [facility];  Service: ENT    PR REVISE AV FISTULA,W/O THROMBECTOMY Left 06/06/2022    Procedure: REVISON, OPEN, ARTERIOVENOUS FISTULA; WITHOUT THROMBECTOMY, AUTOGENOUS OR NONAUTOGENOUS DIALYSIS GRAFT;  Surgeon: Toledo, Lilyan Punt, MD;  Location: MAIN OR [facility];  Service: Transplant    PR REVISE AV FISTULA,W/O THROMBECTOMY Left 07/05/2022 Procedure: REVISON, OPEN, ARTERIOVENOUS FISTULA; WITHOUT THROMBECTOMY, AUTOGENOUS OR NONAUTOGENOUS DIALYSIS GRAFT;  Surgeon: Toledo, Lilyan Punt, MD;  Location: MAIN OR [facility];  Service: Transplant    PR RIGHT HEART CATH O2 SATURATION & CARDIAC OUTPUT N/A 07/20/2020    Procedure: Right Heart Catheterization with PULMONARY ANGIOGRAM;  Surgeon: Marlaine Hind, MD;  Location: [facility] CATH;  Service: Cardiology    PR TRANSPLANT,PREP RENAL GRAFT/ARTERIAL N/A 09/05/2022    Procedure: [facility] RECONSTRUCTION CADAVER/LIVING DONOR RENAL ALLOGRAFT PRIOR TO TRANSPLANT; ARTERIAL ANASTOMOSIS EAC;  Surgeon: Toledo, Lilyan Punt, MD;  Location: MAIN OR [facility];  Service: Transplant    PR TRANSPLANTATION OF KIDNEY N/A 09/05/2022    Procedure: RENAL ALLOTRANSPLANTATION, IMPLANTATION OF GRAFT; WITHOUT RECIPIENT NEPHRECTOMY;  Surgeon: Toledo, Lilyan Punt, MD;  Location: MAIN OR [facility];  Service: Transplant    PR UPPER GI ENDOSCOPY,BIOPSY N/A 07/31/2022    Procedure: UGI ENDOSCOPY; WITH BIOPSY, SINGLE OR MULTIPLE;  Surgeon: Janace Aris, MD;  Location: [facility];  Service: Gastroenterology    PR UPPER GI ENDOSCOPY,BIOPSY N/A 09/12/2022    Procedure: UGI ENDOSCOPY; WITH BIOPSY, SINGLE OR MULTIPLE;  Surgeon: Zetta Bills, MD;  [facility]: [facility];  Service: Gastroenterology    UMBILICAL HERNIA REPAIR         Family History   Problem Relation Age of Onset    Hypertension Mother     Arthritis Mother     Hypertension Father        Social History     Socioeconomic History    Marital status: Single   Tobacco Use    Smoking status: Never    Smokeless tobacco: Never   Vaping Use    Vaping status: Never Used   Substance and Sexual Activity    Alcohol use: Not Currently    Drug use: Never    Sexual activity: Not Currently     Partners: Female     Birth control/protection: Condom     Social Drivers of Health     Financial Resource Strain: Low Risk  (09/06/2022)    Overall Financial Resource Strain (CARDIA)     Difficulty of Paying Living Expenses: Not hard at all   Food Insecurity: No Food Insecurity (09/06/2022)    Hunger Vital Sign     Worried About Running Out of Food in the Last Year: Never true     Ran Out of Food in the Last Year: Never true   Transportation Needs: No Transportation Needs (09/06/2022)    PRAPARE - Therapist, art (Medical): No     Lack of Transportation (Non-Medical): No    Received from [facility], [address]  [facility]    Social Network       No current facility-administered medications for this encounter.     Current Outpatient Medications   Medication Sig Dispense Refill    albuterol HFA 90 mcg/actuation inhaler Inhale 1-2 puffs every four (4) to six (6) hours as needed for wheezing or shortness of breath. 18 g 11    atovaquone (MEPRON) 750 mg/5 mL suspension Take 10 mL (1,500 mg total) by mouth daily. (Patient not taking: Reported on 03/17/2023) 300 mL 0    azathioprine (AZASAN) 100 mg tablet Take 1 tablet (100 mg total) by mouth daily. Take in addition to 50 mg tablet for a total daily dose of 150 mg daily 90 tablet 3    azathioprine (IMURAN) 50 mg tablet Take 1 tablet (50 mg total) by mouth daily. Take in addition to 100 mg tablet for a total daily dose to of 150 mg daily 30 tablet 11    bictegrav-emtricit-tenofov ala (BIKTARVY) 50-200-25 mg tablet Take 1 tablet by mouth every morning. 30 tablet 11    calcitriol (ROCALTROL) 0.5 MCG capsule Take 2 capsules (1 mcg total) by mouth daily. 30 capsule 2    calcium carbonate (TUMS) 200 mg calcium (500 mg) chewable tablet Chew 2 tablets (400 mg elem calcium total) two (2) times a day. 120 tablet 3    carvedilol (COREG) 12.5 MG tablet Take 2 tablets (25 mg total) by mouth two (2) times a day. 120 tablet 0    DIETARY SUPPLEMENT,MISC COMB14 ORAL Taking magnesium gummy supplement once daily. Dosage strength & product unknown      diphenhydrAMINE (BENADRYL) 25 mg capsule Take 1 capsule (25 mg total) by mouth every six (6) hours as needed for itching. 96 capsule 0    empty container Misc Use as directed with Zarxio 1 each 0    filgrastim-sndz (ZARXIO) 480 mcg/0.8 mL Syrg Inject 0.8 mL (480 mcg total) under the skin once a week. Administer only as directed by transplant team. 3.2 mL 1    HYDROcodone-acetaminophen (NORCO) 5-325 mg per tablet Take 1 tablet by mouth daily as needed for pain.      minoxidil (LONITEN) 2.5 MG tablet Take 1 tablet (2.5 mg total) by mouth daily. 30 tablet 0    oxyCODONE (ROXICODONE) 10 mg immediate release tablet Take 1 tablet (10 mg total) by mouth every six (6) hours as needed. 20 tablet 0    pantoprazole (PROTONIX) 40 MG tablet Take 1 tablet (40 mg total) by mouth two (2) times a day. 180 tablet 3    predniSONE (DELTASONE) 5 MG tablet Take 1 tablet (5 mg total) by mouth in the morning. 90 tablet 3    senna (SENOKOT) 8.6 mg tablet Take 1 tablet by mouth daily. (Patient not taking: Reported on 03/17/2023) 30 tablet 0    tacrolimus (PROGRAF) 1 MG capsule Take 10 capsules (10 mg total) by mouth in the morning and take 10 capsules (10 mg total) in the evenings.         Review of Systems  Constitutional: Negative for fever.  Eyes: Negative for visual changes.  ENT: Negative for sore throat.  Cardiovascular: Negative for chest pain.  Respiratory: Negative for shortness of breath.  Gastrointestinal: Negative for abdominal pain, vomiting or diarrhea.  Genitourinary: Negative for dysuria.  Musculoskeletal: Negative for back pain.  Skin: Negative for rash.  Neurological: Negative for headaches, weakness or numbness.    Physical Exam     BP 128/82  - Pulse 78  -  Temp 36.9 ??C (98.4 ??F) (Oral)  - Resp (S) 20  - SpO2 99%     Constitutional: Alert and oriented. Well appearing and in no distress.  Eyes: Conjunctivae are normal.  ENT       Head: Normocephalic and atraumatic.       Nose: No congestion.       Mouth/Throat: Mucous membranes are moist.       Neck: No stridor, neck is supple. Surgical incision site is clean, dry, and intact.   Hematological/Lymphatic/Immunilogical: No cervical lymphadenopathy.  Cardiovascular: Normal rate, regular rhythm. No m/g/r. Normal and symmetric distal pulses are present in all extremities.   Respiratory: Normal respiratory effort. Breath sounds are normal. No wheezes, rales, rhonchi.  Gastrointestinal: Soft and nontender. There is no CVA tenderness.  Musculoskeletal: Nontender with normal range of motion in all extremities. Trace pedal edema bilaterally.  Neurologic: Normal speech and language. No gross focal neurologic deficits are appreciated. Strength, sensation to light touch, reflexes, and cerebellar function intact.   Skin: Skin is warm, dry and intact. No rash noted.  Psychiatric: Mood and affect are normal. Speech and behavior are normal.         Documentation assistance was provided by Nadene Rubins, Scribe on March 20, 2023 at 6:50 PM for Idalia Needle, MD.    March 20, 2023 6:13 PM. Documentation assistance provided by the scribe. I was present during the time the encounter was recorded. The information recorded by the scribe was done at my direction and has been reviewed and validated by me.             Jamas Lav, MD  03/23/23 [id number]

## 2023-03-21 NOTE — Unmapped (Signed)
Hospital Medicine [facility]) Progress Note    Assessment & Plan:   Christian Brown is a 43 y.o. 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     Principal Problem:    Disorder of electrolytes  Active Problems:    Transplanted kidney    HIV infection (CMS-HCC)    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)    Mild intermittent asthma without complication    Pulmonary hypertension (CMS-HCC)    Renovascular hypertension    Iron deficiency anemia    End stage renal failure on dialysis (CMS-HCC)    History of anemia due to CKD    Other neutropenia (CMS-HCC)    Secondary renal hyperparathyroidism (CMS-HCC)    S/P parathyroidectomy    Hypocalcemia    Hypomagnesemia    Hungry bone syndrome      Active Problems    Hypocalcemia 2/2 Hungry Bone Syndrome  Hyperparathyroidism s/p parathyroidectomy (03/14/23)  Underwent partial parathyroidectomy 08/2022 and then remaining gland removal with tissue cryopreservation on 03/14/2023 in setting of persistently elevated PTH/hypercalcemia. Presented to surgical follow up appt on 1/21 with paresthesias, found to be hypocalcemic, hypophosphatemic and hypomagnesemic, sent to the ED for further management. Mg and Phos improved with supplementation. iCa remains low 1/22 at 3.64 and still having paresthesias. Will continue with IV and PO supplementation. Personally discussed with nephrology attending who stated there is room to increase calcitriol and tums if persistently low.  -IV Ca gluconate 2g 1/22  -Continue calcitriol 0.5 mcg BID  -Continue tums 800mg  QID  -PM iCa check  -Daily BMP, iCa  -Monitor on tele    HIV nephropathy, FSGS  Hx ESRD on iHD s/p DDKT (08/2022)  Recently treated for acute transplant rejection after biopsy 03/06/23 with steroids. Cr appears to have stabilized ~1.8. Personally discussed with nephrology attending who recommends trending tac levels as he is currently below goal trough 6-8.  -Nephrology following  -Azathioprine 150mg  daily  -Tac 10mg  BID, goal trough 6-8  -Pharmacy monitoring levels  -Tac levels BID  -Daily BMP    Chronic Problems  HIV- Last CD4 75 03/02/23. Last viral load <20 on 03/03/23. Home Biktarvy, restart home atovaquone PJP ppx.  HTN- home coreg 12.5mg  BID, minoxidil 2.5mg  daily  GERD- home PPI  Mild intermittent asthma without complication- albuterol PRN  Chronic pancytopenia, Anemia of CKD- CBC w diff q48h      Issues Impacting Complexity of Management:  -Intensive monitoring of drug toxicity from Tacrolimus with scheduled levels and Azathioprine with scheduled BMP and levels  -Need for the following intensive monitoring parameter(s) due to high risk of clinical decline: telemetry  -The patient is at high risk of complications from HIV with low CD4 count, hypocalcemia      Medical Decision Making: Reviewed records from the following unique sources  prior DC summary, gen surg office note. Discussed the patient's management and/or test interpretation with  nephrology  as summarized within this note iCa, BMP ordered to evaluate for persistent hypocalcemia.      Daily Checklist:  Diet: Regular Diet  DVT PPx: Heparin 5000units q8h  Electrolytes: Magnesium Repleted  Code Status: Full Code  Dispo:  home pending improvement in Ca, paresthesias    Team Contact Information:   Primary Team: Hospital Medicine [facility])  Primary Resident: Rogue Jury, MD, MD    Interval History:  No acute events overnight. Continues to have tingling in hands. No tongue tingling. Has some pain and mild swelling at surgical site.    All other systems were reviewed and are negative except as noted in the HPI    Objective:   Temp:  [36 ??C (96.8 ??F)-37.6 ??C (99.7 ??F)] 36 ??C (96.8 ??F)  Heart Rate:  [67-100] 86  SpO2 Pulse:  [97] 97  Resp:  [14-20] 18  BP: (112-161)/(81-107) 144/88  SpO2:  [98 %-100 %] 98 %    Gen: NAD, converses   HENT: atraumatic, normocephalic, Chovstek sign negative  Heart: RRR, no murmurs, warm extremities  Lungs: CTAB, no crackles or wheezes  Abdomen: soft, NTND  Extremities: No edema

## 2023-03-22 LAB — BASIC METABOLIC PANEL
ANION GAP: 11 mmol/L (ref 5–14)
BLOOD UREA NITROGEN: 22 mg/dL (ref 9–23)
BUN / CREAT RATIO: 11
CALCIUM: 6.6 mg/dL — ABNORMAL LOW (ref 8.7–10.4)
CHLORIDE: 106 mmol/L (ref 98–107)
CO2: 25 mmol/L (ref 20.0–31.0)
CREATININE: 1.99 mg/dL — ABNORMAL HIGH (ref 0.73–1.18)
EGFR CKD-EPI (2021) MALE: 43 mL/min/{1.73_m2} — ABNORMAL LOW (ref >=60)
GLUCOSE RANDOM: 101 mg/dL (ref 70–179)
POTASSIUM: 4.8 mmol/L (ref 3.5–5.1)
SODIUM: 142 mmol/L (ref 135–145)

## 2023-03-22 LAB — CBC W/ AUTO DIFF
BASOPHILS ABSOLUTE COUNT: 0 10*9/L (ref 0.0–0.1)
BASOPHILS RELATIVE PERCENT: 0.8 %
EOSINOPHILS ABSOLUTE COUNT: 0 10*9/L (ref 0.0–0.5)
EOSINOPHILS RELATIVE PERCENT: 2.5 %
HEMATOCRIT: 27.8 % — ABNORMAL LOW (ref 39.0–48.0)
HEMOGLOBIN: 9.4 g/dL — ABNORMAL LOW (ref 12.9–16.5)
LYMPHOCYTES ABSOLUTE COUNT: 0.4 10*9/L — ABNORMAL LOW (ref 1.1–3.6)
LYMPHOCYTES RELATIVE PERCENT: 31.3 %
MEAN CORPUSCULAR HEMOGLOBIN CONC: 33.8 g/dL (ref 32.0–36.0)
MEAN CORPUSCULAR HEMOGLOBIN: 35.7 pg — ABNORMAL HIGH (ref 25.9–32.4)
MEAN CORPUSCULAR VOLUME: 105.6 fL — ABNORMAL HIGH (ref 77.6–95.7)
MEAN PLATELET VOLUME: 8.1 fL (ref 6.8–10.7)
MONOCYTES ABSOLUTE COUNT: 0.3 10*9/L (ref 0.3–0.8)
MONOCYTES RELATIVE PERCENT: 19.6 %
NEUTROPHILS ABSOLUTE COUNT: 0.6 10*9/L — ABNORMAL LOW (ref 1.8–7.8)
NEUTROPHILS RELATIVE PERCENT: 45.8 %
NUCLEATED RED BLOOD CELLS: 0 /100{WBCs} (ref <=4)
PLATELET COUNT: 115 10*9/L — ABNORMAL LOW (ref 150–450)
RED BLOOD CELL COUNT: 2.63 10*12/L — ABNORMAL LOW (ref 4.26–5.60)
RED CELL DISTRIBUTION WIDTH: 16.3 % — ABNORMAL HIGH (ref 12.2–15.2)
WBC ADJUSTED: 1.4 10*9/L — ABNORMAL LOW (ref 3.6–11.2)

## 2023-03-22 LAB — MAGNESIUM: MAGNESIUM: 1.2 mg/dL — ABNORMAL LOW (ref 1.6–2.6)

## 2023-03-22 LAB — PHOSPHORUS: PHOSPHORUS: 3.4 mg/dL (ref 2.4–5.1)

## 2023-03-22 LAB — TACROLIMUS LEVEL, TROUGH: TACROLIMUS, TROUGH: 3.3 ng/mL — ABNORMAL LOW (ref 5.0–15.0)

## 2023-03-22 LAB — IONIZED CALCIUM VENOUS: CALCIUM IONIZED VENOUS (MG/DL): 3.68 mg/dL — ABNORMAL LOW (ref 4.40–5.40)

## 2023-03-22 MED ORDER — CALCIUM 200 MG (AS CALCIUM CARBONATE 500 MG) CHEWABLE TABLET
ORAL_TABLET | Freq: Four times a day (QID) | ORAL | 0 refills | 14.00 days | Status: CP
Start: 2023-03-22 — End: 2023-04-05

## 2023-03-22 MED ORDER — ATOVAQUONE 750 MG/5 ML ORAL SUSPENSION
Freq: Every day | ORAL | 0 refills | 30.00 days | Status: CP
Start: 2023-03-22 — End: 2023-04-21

## 2023-03-22 MED ORDER — CALCITRIOL 0.5 MCG CAPSULE
ORAL_CAPSULE | Freq: Two times a day (BID) | ORAL | 0 refills | 15.00 days | Status: CP
Start: 2023-03-22 — End: 2024-03-21
  Filled 2023-03-22: qty 60, 15d supply, fill #0

## 2023-03-22 MED ADMIN — magnesium sulfate in D5W 1 gram/100 mL infusion 1 g: 1 g | INTRAVENOUS | @ 15:00:00 | Stop: 2023-03-22

## 2023-03-22 MED ADMIN — calcium carbonate (TUMS) chewable tablet 800 mg elem calcium: 800 mg | ORAL | @ 02:00:00

## 2023-03-22 MED ADMIN — calcitriol (ROCALTROL) capsule 1 mcg: 1 ug | ORAL | @ 13:00:00 | Stop: 2023-03-22

## 2023-03-22 MED ADMIN — magnesium sulfate in D5W 1 gram/100 mL infusion 1 g: 1 g | INTRAVENOUS | @ 17:00:00 | Stop: 2023-03-22

## 2023-03-22 MED ADMIN — azathioprine (IMURAN) tablet 150 mg: 150 mg | ORAL | @ 02:00:00

## 2023-03-22 MED ADMIN — pantoprazole (Protonix) EC tablet 40 mg: 40 mg | ORAL | @ 02:00:00

## 2023-03-22 MED ADMIN — carvedilol (COREG) tablet 12.5 mg: 12.5 mg | ORAL | @ 13:00:00 | Stop: 2023-03-22

## 2023-03-22 MED ADMIN — tacrolimus (PROGRAF) capsule 10 mg: 10 mg | ORAL | @ 13:00:00 | Stop: 2023-03-22

## 2023-03-22 MED ADMIN — bictegrav-emtricit-tenofov ala (BIKTARVY) 50-200-25 mg tablet 1 tablet: 1 | ORAL | @ 12:00:00 | Stop: 2023-03-22

## 2023-03-22 MED ADMIN — calcium gluconate in sodium chloride (NS) 0.9% 2 gram/100 mL IVPB 2 g: 2 g | INTRAVENOUS | @ 13:00:00 | Stop: 2023-03-22

## 2023-03-22 MED ADMIN — calcium carbonate (TUMS) chewable tablet 800 mg elem calcium: 800 mg | ORAL | @ 13:00:00 | Stop: 2023-03-22

## 2023-03-22 MED ADMIN — predniSONE (DELTASONE) tablet 5 mg: 5 mg | ORAL | @ 13:00:00 | Stop: 2023-03-22

## 2023-03-22 MED ADMIN — tacrolimus (PROGRAF) capsule 10 mg: 10 mg | ORAL | @ 02:00:00

## 2023-03-22 MED ADMIN — minoxidil (LONITEN) tablet 2.5 mg: 2.5 mg | ORAL | @ 13:00:00 | Stop: 2023-03-22

## 2023-03-22 MED ADMIN — magnesium sulfate in D5W 1 gram/100 mL infusion 1 g: 1 g | INTRAVENOUS | @ 16:00:00 | Stop: 2023-03-22

## 2023-03-22 MED ADMIN — calcium carbonate (TUMS) chewable tablet 800 mg elem calcium: 800 mg | ORAL | @ 17:00:00 | Stop: 2023-03-22

## 2023-03-22 MED ADMIN — calcitriol (ROCALTROL) capsule 0.5 mcg: .5 ug | ORAL | @ 02:00:00

## 2023-03-22 MED ADMIN — pantoprazole (Protonix) EC tablet 40 mg: 40 mg | ORAL | @ 13:00:00 | Stop: 2023-03-22

## 2023-03-22 NOTE — Unmapped (Signed)
A&Ox4 and VSS during this shift. Denies pain or discomfort. No called from Tele for acute changes. Possible discharge today.     Problem: Adult Inpatient Plan of Care  Goal: Plan of Care Review  Outcome: Progressing  Flowsheets (Taken 03/22/2023 0554)  Progress: improving  Plan of Care Reviewed With: patient  Goal: Patient-Specific Goal (Individualized)  Outcome: Progressing  Goal: Absence of Hospital-Acquired Illness or Injury  Intervention: Identify and Manage Fall Risk  Recent Flowsheet Documentation  Taken 03/22/2023 0400 by Hillery Aldo, RN  Safety Interventions:   low bed   nonskid shoes/slippers when out of bed  Taken 03/22/2023 0200 by Hillery Aldo, RN  Safety Interventions:   low bed   nonskid shoes/slippers when out of bed  Taken 03/22/2023 0000 by Hillery Aldo, RN  Safety Interventions:   low bed   nonskid shoes/slippers when out of bed  Taken 03/21/2023 2200 by Hillery Aldo, RN  Safety Interventions:   low bed   nonskid shoes/slippers when out of bed  Taken 03/21/2023 2000 by Hillery Aldo, RN  Safety Interventions:   low bed   nonskid shoes/slippers when out of bed  Intervention: Prevent Skin Injury  Recent Flowsheet Documentation  Taken 03/22/2023 0400 by Hillery Aldo, RN  Positioning for Skin: Left  Taken 03/22/2023 0200 by Hillery Aldo, RN  Positioning for Skin: Supine/Back  Taken 03/22/2023 0000 by Hillery Aldo, RN  Positioning for Skin: Supine/Back  Taken 03/21/2023 2200 by Hillery Aldo, RN  Positioning for Skin: Supine/Back  Taken 03/21/2023 2000 by Hillery Aldo, RN  Positioning for Skin: Supine/Back  Intervention: Prevent and Manage VTE (Venous Thromboembolism) Risk  Recent Flowsheet Documentation  Taken 03/22/2023 0400 by Hillery Aldo, RN  Anti-Embolism Device Status: Refused  Taken 03/22/2023 0200 by Hillery Aldo, RN  Anti-Embolism Device Status: Refused  Taken 03/22/2023 0000 by Hillery Aldo, RN  Anti-Embolism Device Status: Refused  Taken 03/21/2023 2200 by Hillery Aldo, RN  Anti-Embolism Device Status: Refused  Taken 03/21/2023 2000 by Hillery Aldo, RN  Anti-Embolism Device Status: Refused

## 2023-03-22 NOTE — Unmapped (Addendum)
Christian Brown is a 43 y.o. 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 Presented to surgical follow up appt on 1/21 with paresthesias, found to be hypocalcemic, hypophosphatemic and hypomagnesemic, sent to the ED for further management. Ca, Mg and Phos improved with supplementation. He will continue on calcitriol, tums and Mg supplementation outpatient with nephrology follow up 1/29 and general surgery follow up in 2 weeks.    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 Cr appears to have stabilized ~1.8-2. Continued on home dose azathioprine 150mg  daily, prednisone 5mg  daily, tacrolimus 10mg  BID.    Chronic Problems:  HIV- Last CD4 75 03/02/23. Last viral load <20 on 03/03/23. Home Biktarvy, restarted home atovaquone PJP ppx.  HTN- home coreg 12.5mg  BID, minoxidil 2.5mg  daily  GERD- home PPI  Mild intermittent asthma without complication- albuterol PRN

## 2023-03-22 NOTE — Unmapped (Signed)
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)    HIV infection (CMS-HCC) (POA: Yes)    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) (POA: Not Applicable)    Mild intermittent asthma without complication (POA: Yes)    Pulmonary hypertension (CMS-HCC) (POA: Yes)    Renovascular hypertension (POA: Yes)    Iron deficiency anemia (POA: Yes)    End stage renal failure on dialysis (CMS-HCC) (POA: Not Applicable)    History of anemia due to CKD (POA: Not Applicable)    Other neutropenia (CMS-HCC) (POA: Yes)    Secondary renal hyperparathyroidism (CMS-HCC) (POA: Yes)    S/P parathyroidectomy (POA: Not Applicable)    Hypocalcemia (POA: Yes)    Hypomagnesemia (POA: Yes)    Hungry bone syndrome (POA: Yes)  Resolved Problems:    * No resolved hospital problems. *      Hospital Course:   Christian Brown is a 43 y.o. 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 Presented to surgical follow up appt on 1/21 with paresthesias, found to be hypocalcemic, hypophosphatemic and hypomagnesemic, sent to the ED for further management. Ca, Mg and Phos improved with supplementation. He will continue on calcitriol, tums and Mg supplementation outpatient with nephrology follow up 1/29 and general surgery follow up in 2 weeks.    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 Cr appears to have stabilized ~1.8-2. Continued on home dose azathioprine 150mg  daily, prednisone 5mg  daily, tacrolimus 10mg  BID.    Chronic Problems:  HIV- Last CD4 75 03/02/23. Last viral load <20 on 03/03/23. Home Biktarvy, restarted home atovaquone PJP ppx.  HTN- home coreg 12.5mg  BID, minoxidil 2.5mg  daily  GERD- home PPI  Mild intermittent asthma without complication- albuterol PRN  The patient's hospital stay has been complicated by the following clinically significant conditions requiring additional evaluation and treatment or having a significant effect of this patient's care: - Thrombocytopenia POA requiring further investigation or monitor  - Anemia POA requiring further investigation or monitoring  - Chronic kidney disease POA requiring further investigation, treatment, or monitoring     Outpatient Provider Follow Up Issues:   [ ]  repeat BMP, iCa at follow up nephrology and general surgery appts    Touchbase with Outpatient Provider:  Warm Handoff: Completed on 03/22/23 by Christian Jury, MD  (Resident) via [facility] Message    Procedures:  None  ______________________________________________________________________  Discharge Medications:      Your Medication List        CHANGE how you take these medications      atovaquone 750 mg/5 mL suspension  Commonly known as: MEPRON  Take 10 mL (1,500 mg total) by mouth daily. For prevention of pneumocystis pneumonia.  What changed: additional instructions     calcium carbonate 200 mg calcium (500 mg) chewable tablet  Commonly known as: TUMS  Chew 6 tablets (1,200 mg elem calcium total) four (4) times a day for 14 days.  What changed:   how much to take  when to take this     tacrolimus 1 MG capsule  Commonly known as: PROGRAF  Take 11 capsules (11 mg total) by mouth two (2) times a day.  What changed:   how much to take  how to take this  when to take this  additional instructions            CONTINUE taking these medications      azathioprine 50 mg tablet  Commonly known as: IMURAN  Take 1 tablet (50 mg total) by mouth daily. Take in addition to 100 mg tablet for a total daily dose to of 150 mg daily     azathioprine 100 mg tablet  Commonly known as: AZASAN  Take 1 tablet (100 mg total) by mouth daily. Take in addition to 50 mg tablet for a total daily dose of 150 mg daily     BIKTARVY 50-200-25 mg tablet  Generic drug: bictegrav-emtricit-tenofov ala  Take 1 tablet by mouth every morning.     calcitriol 0.5 MCG capsule  Commonly known as: ROCALTROL  Take 2 capsules (1 mcg total) by mouth two (2) times a day.     carvedilol 12.5 MG tablet  Commonly known as: COREG  Take 2 tablets (25 mg total) by mouth two (2) times a day.     DIETARY SUPPLEMENT,MISC COMB14 ORAL  Taking magnesium gummy supplement once daily. Dosage strength & product unknown     HYDROcodone-acetaminophen 5-325 mg per tablet  Commonly known as: NORCO  Take 1 tablet by mouth daily as needed for pain.     minoxidil 2.5 MG tablet  Commonly known as: LONITEN  Take 1 tablet (2.5 mg total) by mouth daily.     pantoprazole 40 MG tablet  Commonly known as: Protonix  Take 1 tablet (40 mg total) by mouth two (2) times a day.     predniSONE 5 MG tablet  Commonly known as: DELTASONE  Take 1 tablet (5 mg total) by mouth in the morning.     VENTOLIN HFA 90 mcg/actuation inhaler  Generic drug: albuterol  Inhale 1-2 puffs every four (4) to six (6) hours as needed for wheezing or shortness of breath. ASK your doctor about these medications      empty container Misc  Use as directed with Zarxio     oxyCODONE 10 mg immediate release tablet  Commonly known as: ROXICODONE  Take 1 tablet (10 mg total) by mouth every six (6) hours as needed.  Ask about: Should I take this medication?     ZARXIO 480 mcg/0.8 mL Syrg  Generic drug: filgrastim-sndz  Inject 0.8 mL (480 mcg total) under the skin once a week. Administer only as directed by transplant team.              Allergies:  Amlodipine, Doxercalciferol, Fish containing products, Iron sucrose, Labetalol, Metoclopramide, Metoprolol, Nifedipine, Other, Sulfamethoxazole-trimethoprim, Vancomycin,  Calcitonin, Peanut oil, Potassium clavulanate, Azathioprine, Chlorhexidine, Clindamycin, Hydralazine, and Sensipar [cinacalcet]  ______________________________________________________________________  Pending Test Results:        Most Recent Labs:  All lab results last 24 hours -   Recent Results (from the past 24 hours)   Ionized Calcium, Venous    Collection Time: 03/21/23  2:54 PM   Result Value Ref Range    Calcium, Ionized Venous 4.09 (L) 4.40 - 5.40 mg/dL   Ionized Calcium, Venous    Collection Time: 03/22/23  6:59 AM   Result Value Ref Range    Calcium, Ionized Venous 3.68 (L) 4.40 - 5.40 mg/dL   Basic Metabolic Panel    Collection Time: 03/22/23  6:59 AM   Result Value Ref Range    Sodium 142 135 - 145 mmol/L    Potassium 4.8 3.5 - 5.1 mmol/L    Chloride 106 98 - 107 mmol/L    CO2 25.0 20.0 - 31.0 mmol/L    Anion Gap 11 5 - 14 mmol/L    BUN 22 9 - 23 mg/dL    Creatinine 1.61 (H) 0.73 - 1.18 mg/dL    BUN/Creatinine Ratio 11     eGFR CKD-EPI (2021) Male 43 (L) >=60 mL/min/1.77m2    Glucose 101 70 - 179 mg/dL    Calcium 6.6 (L) 8.7 - 10.4 mg/dL   Magnesium Level    Collection Time: 03/22/23  6:59 AM   Result Value Ref Range    Magnesium 1.2 (L) 1.6 - 2.6 mg/dL   Tacrolimus Level, Trough    Collection Time: 03/22/23  6:59 AM   Result Value Ref Range    Tacrolimus, Trough 3.3 (L) 5.0 - 15.0 ng/mL   CBC w/ Differential    Collection Time: 03/22/23  6:59 AM   Result Value Ref Range    WBC 1.4 (L) 3.6 - 11.2 10*9/L    RBC 2.63 (L) 4.26 - 5.60 10*12/L    HGB 9.4 (L) 12.9 - 16.5 g/dL    HCT 09.6 (L) 04.5 - 48.0 %    MCV 105.6 (H) 77.6 - 95.7 fL    MCH 35.7 (H) 25.9 - 32.4 pg    MCHC 33.8 32.0 - 36.0 g/dL    RDW 40.9 (H) 81.1 - 15.2 %    MPV 8.1 6.8 - 10.7 fL    Platelet 115 (L) 150 - 450 10*9/L    nRBC 0 <=4 /100 WBCs    Neutrophils % 45.8 %    Lymphocytes % 31.3 %    Monocytes % 19.6 %    Eosinophils % 2.5 %    Basophils % 0.8 %    Absolute Neutrophils 0.6 (L) 1.8 - 7.8 10*9/L    Absolute Lymphocytes 0.4 (L) 1.1 - 3.6 10*9/L    Absolute Monocytes 0.3 0.3 - 0.8 10*9/L    Absolute Eosinophils 0.0 0.0 - 0.5 10*9/L    Absolute Basophils 0.0 0.0 - 0.1 10*9/L   Phosphorus Level    Collection Time: 03/22/23  6:59 AM   Result Value Ref Range    Phosphorus 3.4 2.4 - 5.1 mg/dL       Relevant Studies/Radiology:  No results found.  ______________________________________________________________________  Discharge Instructions:   Activity Instructions       Activity as tolerated                       Follow Up instructions and Outpatient Referrals     Call MD for:  difficulty breathing,  headache or visual disturbances      Call MD for:  persistent nausea or vomiting      Call MD for:  severe uncontrolled pain      Call MD for:  temperature >38.5 Celsius      Discharge instructions          Appointments which have been scheduled for you      Mar 28, 2023 12:30 PM  (Arrive by 12:15 PM)  RETURN NEPHROLOGY POST with Leafy Half, MD  [facility] [place] [facility]) [address]  [facility] 1 through [address]  [place] [address]  [phone]        Jul 04, 2023 12:30 PM  (Arrive by 12:15 PM)  RETURN NEPHROLOGY POST with Leafy Half, MD  [facility] [place]) [address]  [facility] 1 through [address]  [place] [address]  [phone]             ______________________________________________________________________  Discharge Day Services:  BP 159/81  - Pulse 76  - Temp 36.9 ??C (98.4 ??F) (Temporal)  - Resp 18  - Ht 170.2 cm (5' 7)  - Wt 81.1 kg (178 lb 14.4 oz)  - SpO2 97%  - BMI 28.02 kg/m??     Pt seen on the day of discharge and determined appropriate for discharge.    Condition at Discharge: good    Length of Discharge: I spent greater than 30 mins in the discharge of this patient.

## 2023-03-22 NOTE — Unmapped (Signed)
Per MD, pt is medically stable. Pt provided a printed copy of AVS. VRN reviewed discharge instructions, medications, and appointments with patient. All questions answered, pt verbalized understanding. CP and ED resolved. Pt transported to main entrance, will transport self home.     Problem: Adult Inpatient Plan of Care  Goal: Plan of Care Review  Outcome: Discharged to Home  Goal: Patient-Specific Goal (Individualized)  Outcome: Discharged to Home  Goal: Absence of Hospital-Acquired Illness or Injury  Outcome: Discharged to Home  Goal: Optimal Comfort and Wellbeing  Outcome: Discharged to Home  Goal: Readiness for Transition of Care  Outcome: Discharged to Home  Goal: Rounds/Family Conference  Outcome: Discharged to Home     Problem: Infection  Goal: Absence of Infection Signs and Symptoms  Outcome: Discharged to Home     Problem: Wound  Goal: Optimal Coping  Outcome: Discharged to Home  Goal: Optimal Functional Ability  Outcome: Discharged to Home  Goal: Absence of Infection Signs and Symptoms  Outcome: Discharged to Home  Goal: Improved Oral Intake  Outcome: Discharged to Home  Goal: Optimal Pain Control and Function  Outcome: Discharged to Home  Goal: Skin Health and Integrity  Outcome: Discharged to Home  Goal: Optimal Wound Healing  Outcome: Discharged to Home

## 2023-03-22 NOTE — Unmapped (Signed)
Problem: Adult Inpatient Plan of Care  Goal: Plan of Care Review  Outcome: Progressing  Goal: Patient-Specific Goal (Individualized)  Outcome: Progressing  Goal: Absence of Hospital-Acquired Illness or Injury  Outcome: Progressing  Intervention: Identify and Manage Fall Risk  Recent Flowsheet Documentation  Taken 03/22/2023 1400 by Skipper Cliche, RN  Safety Interventions:   low bed   fall reduction program maintained  Taken 03/22/2023 1200 by Skipper Cliche, RN  Safety Interventions:   low bed   fall reduction program maintained  Taken 03/22/2023 1000 by Skipper Cliche, RN  Safety Interventions:   low bed   fall reduction program maintained  Taken 03/22/2023 0800 by Skipper Cliche, RN  Safety Interventions:   low bed   fall reduction program maintained  Intervention: Prevent Skin Injury  Recent Flowsheet Documentation  Taken 03/22/2023 1400 by Skipper Cliche, RN  Positioning for Skin: Supine/Back  Taken 03/22/2023 1200 by Skipper Cliche, RN  Positioning for Skin: (sitting on edge of bed) Other (Comment)  Taken 03/22/2023 1000 by Skipper Cliche, RN  Positioning for Skin: Supine/Back  Taken 03/22/2023 0800 by Skipper Cliche, RN  Positioning for Skin: Supine/Back  Goal: Optimal Comfort and Wellbeing  Outcome: Progressing  Goal: Readiness for Transition of Care  Outcome: Progressing  Goal: Rounds/Family Conference  Outcome: Progressing   VSS. No reports of pain this shift. Ambulates independently. Tolerated PO diet. IV Magnesium and IV calcium tolerated and completed. No acute needs at this time. AVS printed and given to pt. Discharge education via virtual nurse.

## 2023-03-22 NOTE — Unmapped (Signed)
Problem: Adult Inpatient Plan of Care  Goal: Plan of Care Review  Outcome: Progressing  Goal: Patient-Specific Goal (Individualized)  Outcome: Progressing  Goal: Absence of Hospital-Acquired Illness or Injury  Outcome: Progressing  Intervention: Identify and Manage Fall Risk  Recent Flowsheet Documentation  Taken 03/21/2023 1600 by Skipper Cliche, RN  Safety Interventions:   low bed   fall reduction program maintained  Taken 03/21/2023 1400 by Skipper Cliche, RN  Safety Interventions:   low bed   fall reduction program maintained  Taken 03/21/2023 1200 by Skipper Cliche, RN  Safety Interventions:   low bed   fall reduction program maintained  Taken 03/21/2023 1000 by Skipper Cliche, RN  Safety Interventions:   fall reduction program maintained   low bed  Taken 03/21/2023 0800 by Skipper Cliche, RN  Safety Interventions:   low bed   fall reduction program maintained  Intervention: Prevent Skin Injury  Recent Flowsheet Documentation  Taken 03/21/2023 1600 by Skipper Cliche, RN  Positioning for Skin: Supine/Back  Taken 03/21/2023 1400 by Skipper Cliche, RN  Positioning for Skin: Left  Taken 03/21/2023 1200 by Skipper Cliche, RN  Positioning for Skin: Supine/Back  Taken 03/21/2023 1000 by Skipper Cliche, RN  Positioning for Skin: Bed in Chair  Taken 03/21/2023 0800 by Skipper Cliche, RN  Positioning for Skin: Supine/Back  Goal: Optimal Comfort and Wellbeing  Outcome: Progressing  Goal: Readiness for Transition of Care  Outcome: Progressing  Goal: Rounds/Family Conference  Outcome: Progressing   VSS. Reports of no pain. Ambulates independently. Tolerated PO diet. No acute needs at this time. Care ongoing.

## 2023-03-22 NOTE — Unmapped (Addendum)
Transplant Nephrology Consult     Requesting Attending Physician :  Christian Robinson, MD  Service Requesting Consult : Med Undesignated (MDX)  Reason for Consult: Hypocalcemia and Hx of DDKT    Assessment and Plan:   Patient is a 43y/o M with Past Medical history of ESRD s/p DDKT and Tertiary hyperthyroidism s/p resection here for hypocalcemia    # S/p Kidney Transplant, Kidney allograft function:  # Immunosuppression:  - Patient with stable renal function to 1.6-1.7 recently had allograft biopsy performed results unavailable has follow up with transplant next week.   - AZA 150mg  daily Prednisone 5mg  daily   - Tacrolimus 10/10 goal trough 6-8 below goal   - Please obtain Daily Tacrolimus trough levels prior to the morning dose of the medication.    # Tertieary Hyperparathyroidism s/p resection  #Hypocalcemia 2/2 Hungry bone syndrome  - improvement in Ca after Ca gluconate infusion in the ED  - Agree with Additional IV Ca today  - would recommend going up on Calcitriol to BID  - would recommend that he receives his oral Ca in between meals  - continue to monitor Ionized Ca  - Evaluation and management per primary team  - No changes to management from a nephrology standpoint at this time    RECOMMENDATIONS:   - Calcitriol to BID  - increase PO calcium to 1200 mg every 6 hrs  - would increase FK 11/11  - Encourage PO Hydration  - obtain Daily tacrolimus trough levels  -- We will continue to follow.     Dudley Major, MD  03/22/2023 12:03 PM     Medical decision-making for 03/22/23  Findings / Data     Patient has: []  acute illness w/systemic sxs  [mod]  []  two or more stable chronic illnesses [mod]  []  one chronic illness with acute exacerbation [mod]  []  acute complicated illness  [mod]  []  Undiagnosed new problem with uncertain prognosis  [mod] [x]  illness posing risk to life or bodily function (ex. AKI)  [high]  []  chronic illness with severe exacerbation/progression  [high]  []  chronic illness with severe side effects of treatment  [high] Severe Hypocalcemia, Hx of DDKT Probs At least 2:  Probs, Data, Risk   I reviewed: []  primary team note  []  consultant note(s)  []  external records []  chemistry results  []  CBC results  []  blood gas results  []  Other []  procedure/op note(s)   []  radiology report(s)  []  micro result(s)  []  w/ independent historian(s) Ca improving Cr increasing >=3 Data Review (2 of 3)    I independently interpreted: []  Urine Sediment  []  Renal US []  CXR Images  []  CT Images  []  Other []  EKG Tracing  Any     I discussed: []  Pathology results w/ QHPs(s) from other specialties  []  Procedural findings w/ QHPs(s) from other specialties []  Imaging w/ QHP(s) from other specialties  [x]  Treatment plan w/ QHP(s) from other specialties Plan discussed with primary team Any     Mgm't requires: []  Prescription drug(s)  [mod]  []  Kidney biopsy  [mod]  []  Central line placement  [mod] [x]  High risk medication use and/or intensive toxicity monitoring [high]  []  Renal replacement therapy [high]  []  High risk kidney biopsy  [high]  []  Escalation of care  [high]  []  High risk central line placement  [high] Immunosuppression: high risk for infection Risk      _____________________________________________________________________________________  Interval History/Subjective:  Patient doing well  still has paraesthesias to his finger tips Ca lower this morning getting IV calcium      Transplant Background  Date of Transplant: 09/05/2022 (Kidney)  Type of Transplant: Deceased  KDPI: 18%  Cold ischemic time: 972 minutes   Warm ischemic time: 40 minutes  cPRA: 7%  Blood type: Donor O, Recipient O POS  ID: CMV: D-/R+ EBV: D+/R+   Zero-Hour Biopsy: Not done  Native Kidney Disease: cFSGS/HIVAN (biopsy proven)  Pre-transplant dialysis course: Was on hemodialysis since 02/11/2015  Prior Transplants: None    History of Present Illness:  Christian Brown is a/an 43 y.o. 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 Nephrology has been consulted for Hypocalcemia and Hx of renal Transplant.     Patient presenting to the hospital due to paresthesias. Patient was in clinic after parathyroidectomy for hypercalcemia and found to have significantly lower Ca of 5.7 Patient had been taking 2 tabs of tums and 2 calcitriol tabs a day. Patient was started on Ca gluconate with improvement in symptoms however still has numbness and tingling to his fingers. Calcium is still low    INPATIENT MEDICATIONS:    Current Facility-Administered Medications:     albuterol (PROVENTIL HFA;VENTOLIN HFA) 90 mcg/actuation inhaler 2 puff, Inhalation, Q6H PRN    atovaquone (MEPRON) oral suspension, Oral, Daily    azathioprine (IMURAN) tablet 150 mg, Oral, Daily    bictegrav-emtricit-tenofov ala (BIKTARVY) 50-200-25 mg tablet 1 tablet, Oral, Q AM    calcitriol (ROCALTROL) capsule 1 mcg, Oral, BID    calcium carbonate (TUMS) chewable tablet 800 mg elem calcium, Oral, QID    carvedilol (COREG) tablet 12.5 mg, Oral, BID    heparin (porcine) 5,000 unit/mL injection 5,000 Units, Subcutaneous, Q8H SCH    HYDROcodone-acetaminophen (NORCO) 5-325 mg per tablet 1 tablet, Oral, Daily PRN    [COMPLETED] magnesium sulfate in D5W 1 gram/100 mL infusion 1 g, Intravenous, Once **FOLLOWED BY** [COMPLETED] magnesium sulfate in D5W 1 gram/100 mL infusion 1 g, Intravenous, Once **FOLLOWED BY** magnesium sulfate in D5W 1 gram/100 mL infusion 1 g, Intravenous, Once    minoxidil (LONITEN) tablet 2.5 mg, Oral, Daily    pantoprazole (Protonix) EC tablet 40 mg, Oral, BID    polyethylene glycol (MIRALAX) packet 17 g, Oral, Daily    potassium & sodium phosphates 250mg  (PHOS-NAK/NEUTRA PHOS) packet 2 packet, Oral, Once    potassium phosphate (monobasic) (K-PHOS) tablet 1,000 mg, Oral, Once PRN    predniSONE (DELTASONE) tablet 5 mg, Oral, Daily    senna (SENOKOT) tablet 2 tablet, Oral, Nightly    tacrolimus (PROGRAF) capsule 10 mg, Oral, BID  OUTPATIENT MEDICATIONS:  Prior to Admission medications    Medication Dose, Route, Frequency   albuterol HFA 90 mcg/actuation inhaler Inhale 1-2 puffs every four (4) to six (6) hours as needed for wheezing or shortness of breath.   azathioprine (AZASAN) 100 mg tablet 100 mg, Oral, Daily (standard), Take in addition to 50 mg tablet for a total daily dose of 150 mg daily   bictegrav-emtricit-tenofov ala (BIKTARVY) 50-200-25 mg tablet 1 tablet, Oral, Every morning   calcitriol (ROCALTROL) 0.5 MCG capsule 1 mcg, Oral, Daily (standard)  Patient taking differently: Take 2 capsules (1 mcg total) by mouth two (2) times a day.   calcium carbonate (TUMS) 200 mg calcium (500 mg) chewable tablet 400  mg elem calcium, Oral, 2 times a day (standard)  Patient taking differently: Chew 4 tablets (800 mg elem calcium total) four (4) times a day.   carvedilol (COREG) 12.5 MG tablet 25 mg, Oral, 2 times a day (standard)  Patient taking differently: Take 1 tablet (12.5 mg total) by mouth two (2) times a day.   DIETARY SUPPLEMENT,MISC COMB14 ORAL Taking magnesium gummy supplement once daily. Dosage strength & product unknown   HYDROcodone-acetaminophen (NORCO) 5-325 mg per tablet 1 tablet, Daily PRN   minoxidil (LONITEN) 2.5 MG tablet 2.5 mg, Oral, Daily (standard)   predniSONE (DELTASONE) 5 MG tablet 5 mg, Oral, Daily   tacrolimus (PROGRAF) 1 MG capsule Take 10 capsules (10 mg total) by mouth in the morning and take 10 capsules (10 mg total) in the evenings.   atovaquone (MEPRON) 750 mg/5 mL suspension 1,500 mg, Oral, Daily (standard)  Patient not taking: Reported on 03/21/2023   azathioprine (IMURAN) 50 mg tablet 50 mg, Oral, Daily (standard), Take in addition to 100 mg tablet for a total daily dose to of 150 mg daily   empty container Misc Use as directed with Zarxio  Patient not taking: Reported on 03/21/2023   filgrastim-sndz (ZARXIO) 480 mcg/0.8 mL Syrg 480 mcg, Subcutaneous, Weekly, Administer only as directed by transplant team.  Patient not taking: Reported on 03/21/2023   pantoprazole (PROTONIX) 40 MG tablet 40 mg, Oral, 2 times a day (standard)      ALLERGIES:  Amlodipine, Doxercalciferol, Fish containing products, Iron sucrose, Labetalol, Metoclopramide, Metoprolol, Nifedipine, Other, Sulfamethoxazole-trimethoprim, Vancomycin, Calcitonin, Peanut oil, Potassium clavulanate, Azathioprine, Chlorhexidine, Clindamycin, Hydralazine, and Sensipar [cinacalcet]  MEDICAL HISTORY:  Past Medical History:   Diagnosis Date    Acquired immunodeficiency syndrome (CMS-HCC) 11/14/2013    Formatting of this note might be different from the original.   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   He appears to be doing well. States he is back to 90% except for his leg swelling.    Will change his epivir to 150 mg daily when he completes his liquid rx. Will see him back in 1 month and repeat his labs th    Acute deep vein thrombosis (DVT) of popliteal vein of left lower extremity (CMS-HCC) 02/05/2019    Last Assessment & Plan:   Formatting of this note might be different from the original.  Moderate effusion left knee new the some limitation of motion.  No evidence of sepsis the white count the normal  will     Proceed on with an aspiration for cell count, culture and crystal analysis.  New x-rays ordered and currently pending will follow.  Further therapeutic recommendations contingent on the re    Anemia     Asthma     Candidal esophagitis (CMS-HCC) 07/30/2022    Community acquired pneumonia 07/30/2014    DVT (deep venous thrombosis) (CMS-HCC) 11/24/2013    Last Assessment & Plan:    Formatting of this note might be different from the original.   Appears to be doing better (swelling improved). He will f/u with PCP regarding coumadin dosing (INR is low).  Formatting of this note might be different from the original.   Last Assessment & Plan:    Formatting of this note might be different from the original.   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   greatly appreciate renal f/u.  Formatting of this note might be different from the original.   Last Assessment & Plan:    greatly appreciate renal f/u.   Last Assessment & Plan:    greatly appreciate renal f/u.  Formatting of this note might be different from the original.   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    Rectal bleeding 04/23/2019    Sleep apnea      Past Surgical History:   Procedure Laterality Date    INTUSSUSCEPTION REPAIR      PERICARDIAL WINDOW      PR AUTOTRANSPLANT, PARATHYROID Bilateral 06/17/2019    Procedure: PARATHYROID AUTOTRANSPLANTATION;  Surgeon: Karren Burly, MD;  Location: MAIN OR [place];  Service: ENT    PR CREAT AV FISTULA,NON-AUTOGENOUS GRAFT Left 07/05/2022    Procedure: CREATE AV FISTULA (SEPARATE PROC); NONAUTOGENOUS GRAFT (EG, BIOLOGICAL COLLAGEN, THERMOPLASTIC GRAFT), UPPER EXTREMITY;  Surgeon: Toledo, Lilyan Punt, MD;  Location: MAIN OR [facility];  Service: Transplant    PR EXCISION TURBINATE,SUBMUCOUS Bilateral 10/22/2020    Procedure: SUBMUCOUS RESECTION INFERIOR TURBINATE, PARTIAL OR COMPLETE, ANY METHOD;  Surgeon: Maeola Sarah, MD;  Location: OR [facility];  Service: ENT    PR EXPLORE PARATHYROID GLANDS Bilateral 06/17/2019    Procedure: PARATHYROIDECTOMY OR EXPLORATION OF PARATHYROID(S);  Surgeon: Karren Burly, MD;  Location: MAIN OR [facility];  Service: ENT    PR RE-EXPLORE PARATHYROIDS N/A 03/14/2023    Procedure: PARATHYROIDECTOMY OR EXPLORATION OF PARATHYROID(S); RE-EXPLORATION;  Surgeon: Johny Chess, MD;  Location: OR [facility];  Service: Surgical Oncology    PR REPAIR OF NASAL SEPTUM N/A 10/22/2020    Procedure: SEPTOPLASTY OR SUBMUCOUS RESECTION, WITH OR WITHOUT CARTILAGE SCORING, CONTOURING OR REPLACEMENT WITH GRAFT;  Surgeon: Maeola Sarah, MD;  Location: OR [facility];  Service: ENT    PR REVISE AV FISTULA,W/O THROMBECTOMY Left 06/06/2022    Procedure: REVISON, OPEN, ARTERIOVENOUS FISTULA; WITHOUT THROMBECTOMY, AUTOGENOUS OR NONAUTOGENOUS DIALYSIS GRAFT;  Surgeon: Toledo, Lilyan Punt, MD;  Location: MAIN OR [facility];  Service: Transplant    PR REVISE AV FISTULA,W/O THROMBECTOMY Left 07/05/2022    Procedure: REVISON, OPEN, ARTERIOVENOUS FISTULA; WITHOUT THROMBECTOMY, AUTOGENOUS OR NONAUTOGENOUS DIALYSIS GRAFT;  Surgeon: Toledo, Lilyan Punt, MD;  Location: MAIN OR [facility];  Service: Transplant    PR RIGHT HEART CATH O2 SATURATION & CARDIAC OUTPUT N/A 07/20/2020    Procedure: Right Heart Catheterization with PULMONARY ANGIOGRAM;  Surgeon: Marlaine Hind, MD;  Location: [facility] CATH;  Service: Cardiology    PR TRANSPLANT,PREP RENAL GRAFT/ARTERIAL N/A 09/05/2022    Procedure: [facility] RECONSTRUCTION CADAVER/LIVING DONOR RENAL ALLOGRAFT PRIOR TO TRANSPLANT; ARTERIAL ANASTOMOSIS EAC;  Surgeon: Toledo, Lilyan Punt, MD;  Location: MAIN OR [facility];  Service: Transplant    PR TRANSPLANTATION OF KIDNEY N/A 09/05/2022    Procedure: RENAL ALLOTRANSPLANTATION, IMPLANTATION OF GRAFT; WITHOUT RECIPIENT NEPHRECTOMY;  Surgeon:  Toledo, Lilyan Punt, MD;  Location: MAIN OR [facility]; Service: Transplant    PR UPPER GI ENDOSCOPY,BIOPSY N/A 07/31/2022    Procedure: UGI ENDOSCOPY; WITH BIOPSY, SINGLE OR MULTIPLE;  Surgeon: Janace Aris, MD;  [facility]: [facility];  Service: Gastroenterology    PR UPPER GI ENDOSCOPY,BIOPSY N/A 09/12/2022    Procedure: UGI ENDOSCOPY; WITH BIOPSY, SINGLE OR MULTIPLE;  Surgeon: Zetta Bills, MD;  Location: [facility];  Service: Gastroenterology    UMBILICAL HERNIA REPAIR       SOCIAL HISTORY  Social History     Social History Narrative    Not on file      reports that he has never smoked. He has never used smokeless tobacco. He reports that he does not currently use alcohol. He reports that he does not use drugs.   FAMILY HISTORY  Family History   Problem Relation Age of Onset    Hypertension Mother     Arthritis Mother     Hypertension Father         Physical Exam:   Vitals:    03/22/23 0000 03/22/23 0010 03/22/23 0705 03/22/23 0734   BP:  116/66  159/81   Pulse: 82 85 73 76   Resp:  18  18   Temp:  36.8 ??C (98.2 ??F)  36.9 ??C (98.4 ??F)   TempSrc:  Temporal  Temporal   SpO2:  99%  97%   Weight:       Height:         No intake/output data recorded.    Intake/Output Summary (Last 24 hours) at 03/22/2023 1203  Last data filed at 03/22/2023 0530  Gross per 24 hour   Intake 250 ml   Output 600 ml   Net -350 ml       Constitutional: well-appearing, no acute distress  Heart: regular rate and rhythm, no murmurs, rubs, or gallops  Lungs: clear to auscultation bilaterally without adventitious sounds  Abd: soft, non-tender, non-distended  Ext: No edema

## 2023-03-26 DIAGNOSIS — Z940 Kidney transplant status: Principal | ICD-10-CM

## 2023-03-26 DIAGNOSIS — Z79899 Other long term (current) drug therapy: Principal | ICD-10-CM

## 2023-03-27 DIAGNOSIS — Z940 Kidney transplant status: Principal | ICD-10-CM

## 2023-03-27 NOTE — Unmapped (Signed)
Called pt to check on hypocalcemia symptoms. Symptoms are resolved. He is taking calcitriol 1 mcg BID, Tums takes 6 tablets 4 times daily, and magnesium gummies 2 gummies once daily. He feels fine, and had [facility] labs drawn this morning (results not back).

## 2023-03-28 ENCOUNTER — Ambulatory Visit: Admit: 2023-03-28 | Discharge: 2023-03-28 | Payer: MEDICARE

## 2023-03-28 DIAGNOSIS — Z79899 Other long term (current) drug therapy: Principal | ICD-10-CM

## 2023-03-28 DIAGNOSIS — I158 Other secondary hypertension: Principal | ICD-10-CM

## 2023-03-28 DIAGNOSIS — Z940 Kidney transplant status: Principal | ICD-10-CM

## 2023-03-28 DIAGNOSIS — Z949 Transplanted organ and tissue status, unspecified: Principal | ICD-10-CM

## 2023-03-28 LAB — URINALYSIS WITH MICROSCOPY
BILIRUBIN UA: NEGATIVE
BLOOD UA: NEGATIVE
GLUCOSE UA: NEGATIVE
KETONES UA: NEGATIVE
LEUKOCYTE ESTERASE UA: NEGATIVE
NITRITE UA: NEGATIVE
PH UA: 6 (ref 5.0–7.5)
SPECIFIC GRAVITY UA: 1.015 (ref 1.005–1.030)
UROBILINOGEN UA: 0.2 mg/dL (ref 0.2–1.0)

## 2023-03-28 LAB — CBC W/ DIFFERENTIAL
BANDED NEUTROPHILS ABSOLUTE COUNT: 0 10*3/uL (ref 0.0–0.1)
BASOPHILS ABSOLUTE COUNT: 0 10*3/uL (ref 0.0–0.2)
BASOPHILS RELATIVE PERCENT: 1 %
EOSINOPHILS ABSOLUTE COUNT: 0 10*3/uL (ref 0.0–0.4)
EOSINOPHILS RELATIVE PERCENT: 2 %
HEMATOCRIT: 32.5 % — ABNORMAL LOW (ref 37.5–51.0)
HEMOGLOBIN: 11.1 g/dL — ABNORMAL LOW (ref 13.0–17.7)
IMMATURE GRANULOCYTES: 1 %
LYMPHOCYTES ABSOLUTE COUNT: 0.4 10*3/uL — ABNORMAL LOW (ref 0.7–3.1)
LYMPHOCYTES RELATIVE PERCENT: 27 %
MEAN CORPUSCULAR HEMOGLOBIN CONC: 34.2 g/dL (ref 31.5–35.7)
MEAN CORPUSCULAR HEMOGLOBIN: 36 pg — ABNORMAL HIGH (ref 26.6–33.0)
MEAN CORPUSCULAR VOLUME: 106 fL — ABNORMAL HIGH (ref 79–97)
MONOCYTES ABSOLUTE COUNT: 0.3 10*3/uL (ref 0.1–0.9)
MONOCYTES RELATIVE PERCENT: 18 %
NEUTROPHILS ABSOLUTE COUNT: 0.8 10*3/uL — ABNORMAL LOW (ref 1.4–7.0)
NEUTROPHILS RELATIVE PERCENT: 51 %
PLATELET COUNT: 158 10*3/uL (ref 150–450)
RED BLOOD CELL COUNT: 3.08 x10E6/uL — ABNORMAL LOW (ref 4.14–5.80)
RED CELL DISTRIBUTION WIDTH: 14.6 % (ref 11.6–15.4)
WHITE BLOOD CELL COUNT: 1.5 10*3/uL — CL (ref 3.4–10.8)

## 2023-03-28 LAB — RENAL FUNCTION PANEL
ALBUMIN: 3.8 g/dL — ABNORMAL LOW (ref 4.1–5.1)
BLOOD UREA NITROGEN: 29 mg/dL — ABNORMAL HIGH (ref 6–24)
BUN / CREAT RATIO: 14 (ref 9–20)
CALCIUM: 6.6 mg/dL — CL (ref 8.7–10.2)
CHLORIDE: 107 mmol/L — ABNORMAL HIGH (ref 96–106)
CO2: 18 mmol/L — ABNORMAL LOW (ref 20–29)
CREATININE: 2.13 mg/dL — ABNORMAL HIGH (ref 0.76–1.27)
EGFR: 39 mL/min/{1.73_m2} — ABNORMAL LOW
GLUCOSE: 84 mg/dL (ref 70–99)
PHOSPHORUS, SERUM: 4.1 mg/dL (ref 2.8–4.1)
POTASSIUM: 4.6 mmol/L (ref 3.5–5.2)
SODIUM: 143 mmol/L (ref 134–144)

## 2023-03-28 LAB — MICROSCOPIC EXAMINATION
BACTERIA: NONE SEEN
CASTS: NONE SEEN /LPF
EPITHELIAL CELLS (NON RENAL): NONE SEEN /HPF (ref 0–10)
RBC URINE: NONE SEEN /HPF (ref 0–2)
WBC URINE: NONE SEEN /HPF (ref 0–5)

## 2023-03-28 LAB — MAGNESIUM: MAGNESIUM: 1.3 mg/dL — ABNORMAL LOW (ref 1.6–2.3)

## 2023-03-28 MED ORDER — PANTOPRAZOLE 40 MG TABLET,DELAYED RELEASE
ORAL_TABLET | Freq: Every day | ORAL | 1 refills | 90.00 days | Status: CP | PRN
Start: 2023-03-28 — End: ?

## 2023-03-28 MED ORDER — TACROLIMUS 5 MG CAPSULE, IMMEDIATE-RELEASE
ORAL_CAPSULE | Freq: Two times a day (BID) | ORAL | 3 refills | 90.00 days | Status: CP
Start: 2023-03-28 — End: 2024-03-27

## 2023-03-28 MED ORDER — CALCITRIOL 0.5 MCG CAPSULE
ORAL_CAPSULE | Freq: Two times a day (BID) | ORAL | 11 refills | 30.00 days | Status: CP
Start: 2023-03-28 — End: 2024-03-27
  Filled 2023-04-05: qty 240, 30d supply, fill #0

## 2023-03-28 MED ORDER — TACROLIMUS 1 MG CAPSULE, IMMEDIATE-RELEASE
ORAL_CAPSULE | Freq: Two times a day (BID) | ORAL | 3 refills | 90.00 days | Status: CP
Start: 2023-03-28 — End: ?
  Filled 2023-04-05: qty 120, 30d supply, fill #0

## 2023-03-28 MED ORDER — CARVEDILOL 12.5 MG TABLET
ORAL_TABLET | Freq: Two times a day (BID) | ORAL | 3 refills | 90.00 days | Status: CP
Start: 2023-03-28 — End: 2023-04-27
  Filled 2023-04-10: qty 180, 90d supply, fill #0

## 2023-03-28 MED ORDER — AZATHIOPRINE 100 MG TABLET
ORAL_TABLET | Freq: Every day | ORAL | 3 refills | 90.00 days | Status: CP
Start: 2023-03-28 — End: 2024-03-27
  Filled 2023-04-05: qty 7, 7d supply, fill #0

## 2023-03-28 NOTE — Unmapped (Addendum)
[facility]   TRANSPLANT FOLLOW UP     PCP: Belva Bertin, RTL   Kidney transplant coordinator: Celene Squibb    Date of Visit at Transplant clinic: 03/28/2023     Assessment/Recommendations:     # s/p deceased donor kidney transplant 09/05/2022   Native kidney disease collapsing FSGS/HIVAN  Graft function: creatinine 1.7-2.1  DSAs: Had a Class 2 DSA (DQ) but MFI improved  Biomarkers: Allosure normal 02/02/23. 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 Treated with IV steroids.     # Immunosuppression  Tacrolimus (Prograf) increase to 11 mg BID, goal trough 6-8 ng/mL   - f/u level from 1/28 ([facility]) which represents his level on 10 mg BID  Azathioprine reduce to 100 mg daily, goal to boost dose in future when cytopenias improve  Prednisone 5 mg daily (indicated due to HIVAN)  Initially MMF but did not tolerate due to gastritis; he may benefit from trial of Myfortic in future due to his elevated rejection risk    # Acute issues today  Hx gastritis+PUD, tx PPI, is due for followup endoscopy, not scheduled. Pt states improvement in symptoms and may defer this for now    # BP management   Goal 130/80, as tolerated.  - carvedilol 12.5 mg BID  - minoxidil 2.5 mg daily  Intolerances to amlodipine/nifedipine and hydralazine, hives to metoprolol and labetalol.    # Infectious disease  CMV: D-/R+ EBV: D+/R+   CMV ppx:  Patient chose to do preemptive monitoring, never took Valcyte. Most recent CMV 12/2 was positive <200 and this is the first detectable; will monitor  PJP ppx: patient declined atovaquone. Bactrim allergy. Had acute resp distress to pentamadine. Had cytopenias to dapsone.  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 He is s/p repeat parathyroidectomy 03/14/23 (prior parathyroidetomy April 2021).   - calcitriol - increase to 2 mcg BID  - for VitD level 11: start ergocalciferol 50,000 units weekly x12 weeks  - Tums 6 tabs 4x/day - each tablet contains 400 mg elemental Ca  - Magnesium: increase to 2 gummies BID (from daily)  - phos level has improved since increasing calcitriol  - weekly labs  - pt knows to seek care for numbness (had that at total calcium 5.7)    # Immunizations  Immunization History   Administered Date(s) Administered    COVID-19 VAC,BIVALENT,MODERNA(BLUE CAP) 10/10/2021    COVID-19 VACCINE,MRNA(MODERNA)(PF) 09/28/2019, 10/26/2019    HEPATITIS B VACCINE ADULT, ADJUVANTED, IM(HEPLISAV B) 01/16/2020, 03/18/2020, 05/12/2020    HEPATITIS B VACCINE ADULT,IM(ENERGIX B, RECOMBIVAX) 03/30/2015, 01/27/2016    Hepatitis A (Adult) 09/04/2016, 04/26/2017, 10/10/2021    Hepatitis A Vaccine - Unspecified Formulation 09/04/2016, 04/26/2017    Hepatitis B Vaccine, Unspecified Formulation 12/04/1994, 01/09/1995, 06/12/1995    INFLUENZA TIV (TRI)  32MO+ W/ PRESERV (IM) 11/20/2013, 11/11/2014, 03/15/2016, 11/27/2017, 12/11/2018    INFLUENZA TIV (TRI) PF (IM)(HISTORICAL) 03/15/2016    INFLUENZA VACCINE IIV3(IM)(PF)6 MOS UP 12/14/2022    Influenza Vaccine PF(Quad)(Egg Free)18+(Flublok) 12/09/2021    Influenza Vaccine Quad (IIV4 W/PRESERV) 32MO+ 11/01/2016, 11/26/2018    Influenza Vaccine Quad(IM)6 MO-Adult(PF) 11/20/2013, 11/11/2014, 11/29/2020    Influenza Virus Vaccine, unspecified formulation 11/27/2017, 12/11/2018, 12/12/2019, 11/27/2020    MENINGOCOCCAL VACCINE, A,C,Y, W-135(IM)(MENVEO) 07/10/2016, 03/22/2017    MMR 01/09/1995    Meningococcal Conjugate MCV4P 07/10/2016, 03/22/2017    PNEUMOCOCCAL POLYSACCHARIDE 23-VALENT 11/20/2013, 11/20/2013, 11/26/2018, 11/26/2018    Pneumococcal Conjugate 13-Valent 08/28/2017, 08/28/2017, 12/12/2019    TD(TDVAX),ADSORBED,2LF(IM)(PF) 06/12/1995    TdaP 03/08/2018, 03/08/2018       # Cancer screening  Colonoscopy: start age 48  Skin: recommend yearly dermatology evaluation    # Follow up:  Labs weekly  Visits RTC 2 months      Kidney Transplant History:   Date of Transplant: 09/05/2022 (Kidney)  Type of Transplant: DDKT  KDPI: 18%  Cold ischemic time: 972 minutes  Warm ischemic time: 40 minutes  cPRA: 0-7%  HLA match: 4/6  Blood type: Donor O, Recipient O POS  ID: CMV: D-/R+ EBV: D+/R+, HCV donor serologies not immediately available (requesting this info)  Native Kidney Disease: collapsing FSGS/HIVAN (biopsy proven)    Native kidney biopsy: yes   Pre-transplant dialysis course: hemodialysis since 02/11/2015   Post-Transplant Course:    Delayed graft function requiring dialysis: no   Other complications: readmitted 7/31-8/2 for nausea/vomiting  Prior Transplants: None  Induction: thymoglobulin  Early steroid withdrawal: No (due to HIVAN)    Biopsies:   Zero-Hour Biopsy: none    History of Presenting Illness:     Since the last visit:  - home BP not checking  - s/p parathyroidectomy 1/15, readmitted to [facility] 1/21-1/22 for hypocalcemia due to hungry bone syndrome (he was symptomatic with numbness before readmission; no recurrence of numbness)    Concerns about nonadherence: No    Social: lives in [place]. 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     Review of Systems:   A 12-system review was negative except as documented in the HPI.    Physical Exam:     BP 105/64 (BP Site: R Arm, BP Position: Sitting, BP Cuff Size: Medium)  - Pulse 98  - Temp 37.7 ??C (99.8 ??F) (Temporal)  - Wt 78.9 kg (174 lb)  - BMI 27.25 kg/m??   Constitutional:  Well-appearing in NAD  Eyes:  anicteric sclerae  ENT:  MMM  CV:  RRR, no m/r/g, no JVD, extremities WWP with no edema  Resp:  Good air movement, CTAB  GI:  Abdomen soft, NTND, +bs  MSK:  Grossly normal, exam is limited  Skin:  Normal turgor, no rash  Neuro:  Grossly normal, exam is limited  Psych:  Normal affect  Dialysis access: LUE AVF forearm - no bruit or thrill as of the 03/28/23 visit      Allergies:   Allergies   Allergen Reactions    Amlodipine Hives, Palpitations, Rash and Other (See Comments)     Causes fatigue      Doxercalciferol Hives    Fish Containing Products Hives and Shortness Of Breath     Please do not serve fish for meals. Highly allergic         Iron Sucrose Hives    Labetalol Itching and Rash    Metoclopramide Hives and Other (See  Comments)     Causes ticks/ jerks    Metoprolol Hives    Nifedipine Hives, Itching and Shortness Of Breath    Other Swelling     Suture material, localized swelling at closure site    Sulfamethoxazole-Trimethoprim Rash and Other (See Comments)     Renal failure      Vancomycin Hives and Other (See Comments)     Patient states vancomycin caused kidney injury    Calcitonin     Peanut Oil Other (See Comments)    Potassium Clavulanate     Azathioprine Itching and Rash    Chlorhexidine Itching     Pre-op wipes only    Clindamycin Other (See Comments) and Palpitations     Chest pain    Hydralazine Itching and Dizziness           Sensipar [Cinacalcet] Hives        Current Medications:   Current Outpatient Medications   Medication Sig Dispense Refill    albuterol HFA 90 mcg/actuation inhaler Inhale 1-2 puffs every four (4) to six (6) hours as needed for wheezing or shortness of breath. 18 g 11    atovaquone (MEPRON) 750 mg/5 mL suspension Take 10 mL (1,500 mg total) by mouth daily for prevention of pneumocystis pneumonia. 300 mL 0    azathioprine (AZASAN) 100 mg tablet Take 1 tablet (100 mg total) by mouth daily. Take in addition to 50 mg tablet for a total daily dose of 150 mg daily 90 tablet 3    azathioprine (IMURAN) 50 mg tablet Take 1 tablet (50 mg total) by mouth daily. Take in addition to 100 mg tablet for a total daily dose to of 150 mg daily 30 tablet 11    bictegrav-emtricit-tenofov ala (BIKTARVY) 50-200-25 mg tablet Take 1 tablet by mouth every morning. 30 tablet 11    calcitriol (ROCALTROL) 0.5 MCG capsule Take 2 capsules (1 mcg total) by mouth two (2) times a day. 60 capsule 0    calcium carbonate (TUMS) 200 mg calcium (500 mg) chewable tablet Chew 6 tablets (1,200 mg elem calcium total) four (4) times a day for 14 days. 336 tablet 0    carvedilol (COREG) 12.5 MG tablet Take 2 tablets (25 mg total) by mouth two (2) times a day. (Patient taking differently: Take 1 tablet (12.5 mg total) by mouth two (2) times a day.) 120 tablet 0    DIETARY SUPPLEMENT,MISC COMB14 ORAL Taking magnesium gummy supplement once daily. Dosage strength & product unknown      empty container Misc Use as directed with Zarxio (Patient not taking: Reported on 03/21/2023) 1 each 0    filgrastim-sndz (ZARXIO) 480 mcg/0.8 mL Syrg Inject 0.8 mL (480 mcg total) under the skin once a week. Administer only as directed by transplant team. (Patient not taking: Reported on 03/21/2023) 3.2 mL 1    HYDROcodone-acetaminophen (NORCO) 5-325 mg per tablet Take 1 tablet by mouth daily as needed for pain.      minoxidil (LONITEN) 2.5 MG tablet Take 1 tablet (2.5 mg total) by mouth daily. 30 tablet 0    pantoprazole (PROTONIX) 40 MG tablet Take 1 tablet (40 mg total) by mouth two (2) times a day. 180 tablet 3    predniSONE (DELTASONE) 5 MG tablet Take 1 tablet (5 mg total) by mouth in the morning. 90 tablet 3    tacrolimus (PROGRAF) 1 MG capsule Take 11 capsules (11 mg total) by mouth two (2) times a day.  No current facility-administered medications for this visit.       Past Medical History:   Past Medical History:   Diagnosis Date    Acquired immunodeficiency syndrome (CMS-HCC) 11/14/2013    Formatting of this note might be different from the original.   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   He appears to be doing well. States he is back to 90% except for his leg swelling.    Will change his epivir to 150 mg daily when he completes his liquid rx. Will see him back in 1 month and repeat his labs th    Acute deep vein thrombosis (DVT) of popliteal vein of left lower extremity (CMS-HCC) 02/05/2019    Last Assessment & Plan:   Formatting of this note might be different from the original.  Moderate effusion left knee new the some limitation of motion.  No evidence of sepsis the white count the normal  will     Proceed on with an aspiration for cell count, culture and crystal analysis.  New x-rays ordered and currently pending will follow.  Further therapeutic recommendations contingent on the re    Anemia     Asthma     Candidal esophagitis (CMS-HCC) 07/30/2022    Community acquired pneumonia 07/30/2014    DVT (deep venous thrombosis) (CMS-HCC) 11/24/2013    Last Assessment & Plan:    Formatting of this note might be different from the original.   Appears to be doing better (swelling improved). He will f/u with PCP regarding coumadin dosing (INR is low).  Formatting of this note might be different from the original.   Last Assessment & Plan:    Formatting of this note might be different from the original.   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   greatly appreciate renal f/u.  Formatting of this note might be different from the original.   Last Assessment & Plan:    greatly appreciate renal f/u.   Last Assessment & Plan:    greatly appreciate renal f/u.  Formatting of this note might be different from the original.   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        Electronically signed by:   Leafy Half, MD  [facility]

## 2023-03-28 NOTE — Unmapped (Signed)
received critical lab values from [facility] on 03/28/2023 at 0823.      Labs drawn 03/26/2026 at 1017--WBC 1.5 and Calcium 6.6 (confirmed by repeat testing).  Primary nephrologist and nurse coordinator made aware via routed message.

## 2023-03-28 NOTE — Unmapped (Signed)
Fever: No  Chills: No    Pain: yes: reports chronic pain - has hydrocodone at home    Painful urination:No    Nausea: No  Vomiting: No  Diarrhea: No    Chest Pain: No    Tremors: No  Headaches: No  Numbness/Tingling: Yes - reports improvement with calcium level 6.6    Edema: No  SOB: No  Intake: 60 oz  Output: no s/s UTI    BP readings: 105/64 - not checking at home  Dizziness/Lightheadedness:No    Plan -   Decrease imuran to 100 mg daily  Increase calcitriol to 2 mcg bid  Increase magnesium to 2 gummies bid  Zarxio today in clinic  Cont lab 2 X weekly

## 2023-03-28 NOTE — Unmapped (Signed)
[facility] CLINIC PHARMACY NOTE  Christian Brown  [id number]    Medication changes today:   1. Decrease azathioprine to 100 mg daily  2. Increase tacrolimus to 11 mg BID  3. Increase calcitriol to 2 mcg BID  4. Increase magnesium to 2 gummies BID    Education/Adherence tools provided today:  - Provided updated medication list  - Provided additional education on immunosuppression and transplant related medications including reviewing indications of medications, dosing and side effects  - Discussed adherence reminder tools such as cell phone alarms    Follow up items:  1. Goal of understanding indications and dosing of immunosuppression medications  2. CD4/HIV viral load  3. Calcium level    Next visit with pharmacy in 1-3 months  ____________________________________________________________________    Christian Brown is a 43 y.o. 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 POD3 transferred to SICU for hypertensive emergency and placed on nicardipine gtt. Transferred back to floor on POD6 and developed gastritis and esophagitis.   Readmitted to [facility] 09/27/22-09/29/22 for incisional pain and drainage. Found to be hyperkalemic (5.8) and treated with Lokelma and insulin.    Rejection History: NTD  Infection History: NTD  ___________________________________________________________________    Last seen by pharmacy 4 months ago    Interval History:   1/2-1/17 admission for AKI.  Biopsy showed calcium phosphate deposition but concurrent TCMR could not be excluded.  He was treated with IV mpred.  He had a parathyroidectomy performed during admission  1/21-1/23/25: admission for hypercalcemia.  Calcitriol and TUMS were increased prior to DC    Seen by pharmacy today for: medication management and pill box fill and adherence education    CC:  Patient has no complaints today     General: Fatigue  Neuro: No issues  CV: No issues  Resp: No issues  GI: Nausea  GU: No issues  Derm: Itching and Rash  Psych: No issues.    Fluid Status:   Edema no, SOB no  Intake: did not review  Plan: Continue to monitor      There were no vitals filed for this visit.    ___________________________________________________________________    Allergies   Allergen Reactions    Amlodipine Hives, Palpitations, Rash and Other (See Comments)     Causes fatigue      Doxercalciferol Hives    Fish Containing Products Hives and Shortness Of Breath     Please do not serve fish for meals. Highly allergic         Iron Sucrose Hives    Labetalol Itching and Rash    Metoclopramide Hives and Other (See Comments)     Causes ticks/ jerks    Metoprolol Hives    Nifedipine Hives, Itching and Shortness Of Breath    Other Swelling     Suture material, localized swelling at closure site    Sulfamethoxazole-Trimethoprim Rash and Other (See Comments)     Renal failure      Vancomycin Hives and Other (See Comments)     Patient states vancomycin caused kidney injury    Calcitonin  Peanut Oil Other (See Comments)    Potassium Clavulanate     Azathioprine Itching and Rash    Chlorhexidine Itching     Pre-op wipes only    Clindamycin Other (See Comments) and Palpitations     Chest pain    Hydralazine Itching and Dizziness           Sensipar [Cinacalcet] Hives       Medications reviewed in EPIC medication station and updated today by the clinical pharmacist practitioner.    Current Outpatient Medications   Medication Instructions    albuterol HFA 90 mcg/actuation inhaler Inhale 1-2 puffs every four (4) to six (6) hours as needed for wheezing or shortness of breath.    atovaquone (MEPRON) 750 mg/5 mL suspension Take 10 mL (1,500 mg total) by mouth daily for prevention of pneumocystis pneumonia.    azathioprine (AZASAN) 100 mg, Oral, Daily (standard), Take in addition to 50 mg tablet for a total daily dose of 150 mg daily    azathioprine (IMURAN) 50 mg, Oral, Daily (standard), Take in addition to 100 mg tablet for a total daily dose to of 150 mg daily    bictegrav-emtricit-tenofov ala (BIKTARVY) 50-200-25 mg tablet 1 tablet, Oral, Every morning    calcitriol (ROCALTROL) 1 mcg, Oral, 2 times a day    calcium carbonate (TUMS) 200 mg calcium (500 mg) chewable tablet 1,200 mg elem calcium, Oral, 4 times a day    carvedilol (COREG) 25 mg, Oral, 2 times a day (standard)    DIETARY SUPPLEMENT,MISC COMB14 ORAL Taking magnesium gummy supplement once daily. Dosage strength & product unknown    empty container Misc Use as directed with Zarxio    HYDROcodone-acetaminophen (NORCO) 5-325 mg per tablet 1 tablet, Daily PRN    minoxidil (LONITEN) 2.5 mg, Oral, Daily (standard)    pantoprazole (PROTONIX) 40 mg, Oral, 2 times a day (standard)    predniSONE (DELTASONE) 5 mg, Oral, Daily    tacrolimus (PROGRAF) 11 mg, Oral, 2 times a day    ZARXIO 480 mcg, Subcutaneous, Weekly, Administer only as directed by transplant team.       GRAFT FUNCTION:  above baseline  Lab Results   Component Value Date    Creatinine 2.13 (H) 03/27/2023    Creatinine 1.99 (H) 03/22/2023    Creatinine 1.79 (H) 03/21/2023    Creatinine 1.63 (H) 03/20/2023    Creatinine 1.79 (H) 03/20/2023    Creatinine 1.91 (H) 03/19/2023    Creatinine 1.56 (H) 02/16/2023    Creatinine 1.70 (H) 01/29/2023       Baseline Scr:  1.6-1.8  Scr nadir:  1.6  Estimated Creatinine Clearance: 47 mL/min (A) (based on SCr of 2.13 mg/dL (H)).    Proteinuria/UPC: No/ none.  Lab Results   Component Value Date    Protein/Creatinine Ratio, Urine 0.332 12/14/2022    Protein/Creatinine Ratio, Urine 0.289 11/02/2022    Protein/Creatinine Ratio, Urine 0.288 10/06/2022 Protein/Creatinine Ratio, Urine 0.356 09/17/2022       DSA: ntd  Lab Results   Component Value Date    DSA Comment  03/02/2023      Comment:      The HLA antigens listed are donor antigens and the corresponding MFI value.  MFI values >= 1000 are considered positive.  A negative result does not exclude the presence of low level DSA.  Blank donor antigen and MFI fields indicate unavailable donor HLA   type or lack of appropriate single antigen bead to determine DSA.  As such,  the presence or absence of DSA to the locus cannot be confirmed.     Donor specific antibody (DSA) testing is performed with a  solid phase multiplex single antigen bead array method.  All procedures and reagents have been validated and performance characteristics determined by the [facility].    Certain of these tests have not been cleared/approved by the U.S. Food and Drug Administration (FDA).  The FDA has determined that such approval/clearance is not necessary because this laboratory is certified under the Clinical Laboratory Improvements   Amendments to perform high complexity testing.  This test is used for clinical purposes.  It should not be regarded as investigational or for research.  All HLA typings are performed using molecular methodologies.  HLA-A, B, C, DR, and DQ results are   serological equivalents of the molecular type.           Zero hour biopsy: Not preformed  Biopsies to date:   -03/06/23: acute on chronic tubulointerstitial injury, numerous calcium phosphate deposits c/w nephrocalcinosis.  A few scarred areas have mild tubulitis for which TCMR cannot be excluded      CURRENT IMMUNOSUPPRESSION:    Tacrolimus Goal: 7-9 (HIV+)  Tacrolimus (Prograf) 10 mg BID  Azathioprine 150 mg daily  Prednisone 5 mg daily  *converted to aza from MMF due to esophagitis/gastritis found on EGD 09/12/22    IMMUNOSUPPRESSION DRUG LEVELS:  Lab Results   Component Value Date    Tacrolimus, Trough 3.3 (L) 03/22/2023    Tacrolimus, Trough 4.2 (L) 03/16/2023    Tacrolimus, Trough 5.2 03/15/2023    Tacrolimus Lvl 5.5 03/19/2023     No results found for: CYCLO  No results found for: EVEROLIMUS  No results found for: SIROLIMUS    Did not have Prograf level drawn today    Patient is tolerating immunosuppression well    WBC/ANC:   low  Lab Results   Component Value Date    WBC 1.5 (LL) 03/27/2023       Plan: Will decrease azathioprine to 100 mg daily.  Increase tacrolimus to 11 mg BID. Consider retrial of mycophenolate (Myfortic) in the future pending repeat EGD. Continue to monitor.      OI Prophylaxis:   CMV Status: D-/ R+, moderate risk.   CMV prophylaxis: valganciclovir 450 mg daily x 3 months per protocol. (Not taking, patient prefers preemptive monitoring)  CMV Prophylaxis End Date: 12/06/22  Lab Results   Component Value Date    CMV Quant Positive < 200 01/29/2023    CMV Quant Negative 01/23/2023    CMV Quant Negative 01/15/2023    CMV Quant Positive < 200 01/08/2023    CMV Quant Negative 01/01/2023    CMV Quant Negative 12/25/2022       PCP Prophylaxis: none - anemia with dapsone and refused atovaquone  PCP Prophylaxis End Date: 09/05/23  G6PD 12.3 on 09/15/22, patient reports tolerating dapsone in the past   *Received inhaled pentamidine x1 on 09/07/22 (bronchospasms given albuterol/IV Benadryl), hx of Bactrim allergy (vomiting on treatment dose Bactrim for PJP)  Patient refusing atovaquone at this time.    Thrush: completed in hospital    Patient is  off prophylaxis    Plan:  Continue to monitor.      CV Prophylaxis:  aspirin 81 mg (refused during initial discharge)  The 10-year ASCVD risk score (Arnett DK, et al., 2019) is: 0.6%  No results found for: LIPID    Statin therapy: Not indicated; currently on none  Plan:  Recommend updated lipid panel post transplant . Continue to monitor     BP: Goal < 140/90. Clinic vitals reported above  Home BP ranges: 130s/70s-80s    Current meds include: carvedilol 12.5 mg BID, minoxidil 2.5 mg daily  *allergies to hydralazine, amlodipine, nifedipine, labetalol, metoprolol  Plan: within goal. Continue to monitor    Anemia of CKD:  H/H:   Lab Results   Component Value Date    HGB 11.1 (L) 03/27/2023     Lab Results   Component Value Date    HCT 32.5 (L) 03/27/2023     Iron panel:  Lab Results   Component Value Date    IRON 106 12/14/2022    TIBC 279 12/14/2022    FERRITIN 204.8 12/14/2022     Lab Results   Component Value Date    Iron Saturation (%) 38 12/14/2022       Prior ESA use: none recently    Plan: stable. Continue to monitor.     DM:   Lab Results   Component Value Date    A1C 4.5 (L) 12/14/2022   . Goal A1c < 7  History of Dm? No  Established with endocrinologist/PCP for BG managment? No  Currently on: None    Diet: Not assessed  Exercise: Not yet  Hypoglycemia: no  Plan: No change.   Continue to monitor      HIV: Diagnosed in 2015; pre-ART VL 98,300 (10/2013). VL last checked 09/05/22: <20  Current meds: Biktarvy 50-200-25 mg daily  HIV VL <20 on 03/03/23  Plan: Monitor CD4/HIV viral load every 60-90 days for 12 months post transplant.      Electrolytes: Mag is low, calcium is low  Lab Results   Component Value Date    Potassium 4.6 03/27/2023    Sodium 143 03/27/2023    Magnesium 1.3 (L) 03/27/2023    CO2 18 (L) 03/27/2023    Phosphorus 3.4 03/22/2023       Meds currently on: mag gummies - 2 once daily, TUMS 6 tabs (of 1000g mtab) 4 times per day, calcitriol 1 mcg BID  Plan: Increase calcitriol to 2 mcg BID. Continue to monitor     GI/BM: pt reports 1-2BM  Meds currently on: pantoprazole 40 mg BID (not always taking),   Plan: Decrease PPI to PRN. Needs to repeat EGD. Continue to monitor    Pain: pt reports mild pain  Meds currently on: APAP PRN  Plan: No change.  Continue to monitor    Bone health:   Vitamin D Level: last level is wnl . Goal > 30.   Lab Results   Component Value Date    Vitamin D Total (25OH) 11.9 (L) 03/02/2023       Lab Results   Component Value Date    Calcium 6.6 (<) 03/27/2023 Calcium 6.6 (L) 03/22/2023    Calcium 6.4 (L) 03/21/2023    Calcium 5.7 (<) 03/19/2023       Last DEXA results:  none available  Current meds include: none *allergic to calcitonin and cincalcet  Plan: Vitamin D level  out of goal,increase calcitriol as above. Continue to monitor.    Women's/Men's Health:  Christian Brown is a 43 y.o. male. Patient reports no men's/women's health issues  Plan: Continue to monitor    Immunizations:  Influenza [Annual]: Received 11/2022    PCV13: Received 12/12/19  PPSV23: Received 11/26/18  PCV20: Never received    Shingrix Zoster [2 doses, 2 - 6 months apart]: Never received  COVID-19 [3 primary doses, 2 boosters]: 1st dose given 09/28/19, 2nd dose given 10/26/19, and Booster given 10/10/21    Immunization status: up to date and documented.    Pharmacy preference:  [facility]  Medication Refills:  N/A  Medication Access:  N/A    Adherence: Patient has average understanding of medications; was able to independently identify names/doses of immunosuppressants and OI meds.  Patient  does not fill their own pill box on a regular basis at home. Not using a pill box at home.  Patient brought medication card:yes  Pill box:did not bring  Plan: Discussed use of pill box at home, at this time patient prefers to not use a pill box; provided moderate adherence counseling/intervention    Patient was reviewed with [name] who was agreement with the stated plan:     During this visit, the following was completed:   BG log data assessment  BP log data assessment  Labs ordered and evaluated  complex treatment plan >1 DS   I spent a total of 20 minutes face to face with the patient delivering clinical care and providing education/counseling.    All questions/concerns were addressed to the patient's satisfaction.  __________________________________________  Hazeline Junker, PharmD, CPP  Abdominal Transplant Clinical Pharmacist Practitioner

## 2023-03-29 DIAGNOSIS — Z940 Kidney transplant status: Principal | ICD-10-CM

## 2023-03-29 MED ORDER — ERGOCALCIFEROL (VITAMIN D2) 1,250 MCG (50,000 UNIT) CAPSULE
ORAL_CAPSULE | ORAL | 1 refills | 84.00 days | Status: CP
Start: 2023-03-29 — End: 2024-03-28

## 2023-03-29 NOTE — Unmapped (Signed)
Per [name], Pt advised to start ergocalciferol 50k weekly for 12 weeks.  Script sent to local pharmacy.  Pt sent [place] My chart message of the above.

## 2023-03-30 LAB — TACROLIMUS LEVEL: TACROLIMUS BLOOD: 3.1 ng/mL (ref 2.0–20.0)

## 2023-03-30 NOTE — Unmapped (Addendum)
[facility] Pharmacist has reviewed a new prescription for tacrolimus that indicates a dose increase.  Patient was counseled on this dosage change by [facility]- see epic note from 03/28/23.  Next refill call date adjusted if necessary.        Clinical Assessment Needed For: Dose Change  Medication: Tacrolimus 1mg  capsule  Last Fill Date/Day Supply: 03/16/2023 / 16 days  Copay $0  Was previous dose already scheduled to fill: No    Notes to Pharmacist: N/A      Clinical Assessment Needed For: Dose Change  Medication: Tacrolimus 5mg  capsule  Last Fill Date/Day Supply: 02/27/2023 / 30 days  Copay $0  Was previous dose already scheduled to fill: No    Notes to Pharmacist: N/A

## 2023-04-02 DIAGNOSIS — Z940 Kidney transplant status: Principal | ICD-10-CM

## 2023-04-02 DIAGNOSIS — Z79899 Other long term (current) drug therapy: Principal | ICD-10-CM

## 2023-04-03 DIAGNOSIS — Z940 Kidney transplant status: Principal | ICD-10-CM

## 2023-04-03 LAB — RENAL FUNCTION PANEL
ALBUMIN: 3.8 g/dL — ABNORMAL LOW (ref 4.1–5.1)
BLOOD UREA NITROGEN: 17 mg/dL (ref 6–24)
BUN / CREAT RATIO: 10 (ref 9–20)
CALCIUM: 5.4 mg/dL — CL (ref 8.7–10.2)
CHLORIDE: 107 mmol/L — ABNORMAL HIGH (ref 96–106)
CO2: 18 mmol/L — ABNORMAL LOW (ref 20–29)
CREATININE: 1.77 mg/dL — ABNORMAL HIGH (ref 0.76–1.27)
EGFR: 49 mL/min/{1.73_m2} — ABNORMAL LOW
GLUCOSE: 88 mg/dL (ref 70–99)
PHOSPHORUS, SERUM: 4.1 mg/dL (ref 2.8–4.1)
POTASSIUM: 4.5 mmol/L (ref 3.5–5.2)
SODIUM: 140 mmol/L (ref 134–144)

## 2023-04-03 LAB — TACROLIMUS LEVEL: TACROLIMUS BLOOD: 5.1 ng/mL (ref 5.0–20.0)

## 2023-04-03 LAB — MICROSCOPIC EXAMINATION
BACTERIA: NONE SEEN
CASTS: NONE SEEN /LPF
EPITHELIAL CELLS (NON RENAL): NONE SEEN /HPF (ref 0–10)
RBC URINE: NONE SEEN /HPF (ref 0–2)
WBC URINE: NONE SEEN /HPF (ref 0–5)

## 2023-04-03 LAB — URINALYSIS WITH MICROSCOPY
BILIRUBIN UA: NEGATIVE
BLOOD UA: NEGATIVE
GLUCOSE UA: NEGATIVE
KETONES UA: NEGATIVE
LEUKOCYTE ESTERASE UA: NEGATIVE
NITRITE UA: NEGATIVE
PH UA: 7 (ref 5.0–7.5)
PROTEIN UA: NEGATIVE
SPECIFIC GRAVITY UA: 1.013 (ref 1.005–1.030)
UROBILINOGEN UA: 0.2 mg/dL (ref 0.2–1.0)

## 2023-04-03 LAB — CBC W/ DIFFERENTIAL
BASOPHILS ABSOLUTE COUNT: 0 10*3/uL (ref 0.0–0.2)
BASOPHILS RELATIVE PERCENT: 1 %
EOSINOPHILS ABSOLUTE COUNT: 0 10*3/uL (ref 0.0–0.4)
EOSINOPHILS RELATIVE PERCENT: 3 %
HEMATOCRIT: 31.6 % — ABNORMAL LOW (ref 37.5–51.0)
HEMOGLOBIN: 10.7 g/dL — ABNORMAL LOW (ref 13.0–17.7)
LYMPHOCYTES ABSOLUTE COUNT: 0.5 10*3/uL — ABNORMAL LOW (ref 0.7–3.1)
LYMPHOCYTES RELATIVE PERCENT: 37 %
MEAN CORPUSCULAR HEMOGLOBIN CONC: 33.9 g/dL (ref 31.5–35.7)
MEAN CORPUSCULAR HEMOGLOBIN: 35 pg — ABNORMAL HIGH (ref 26.6–33.0)
MEAN CORPUSCULAR VOLUME: 103 fL — ABNORMAL HIGH (ref 79–97)
MONOCYTES ABSOLUTE COUNT: 0.2 10*3/uL (ref 0.1–0.9)
MONOCYTES RELATIVE PERCENT: 17 %
NEUTROPHILS ABSOLUTE COUNT: 0.6 10*3/uL — ABNORMAL LOW (ref 1.4–7.0)
NEUTROPHILS RELATIVE PERCENT: 42 %
NUCLEATED RED BLOOD CELLS: 1 % — ABNORMAL HIGH (ref 0–0)
PLATELET COUNT: 153 10*3/uL (ref 150–450)
RED BLOOD CELL COUNT: 3.06 x10E6/uL — ABNORMAL LOW (ref 4.14–5.80)
RED CELL DISTRIBUTION WIDTH: 14.3 % (ref 11.6–15.4)
WHITE BLOOD CELL COUNT: 1.4 10*3/uL — CL (ref 3.4–10.8)

## 2023-04-03 LAB — MAGNESIUM: MAGNESIUM: 1.4 mg/dL — ABNORMAL LOW (ref 1.6–2.3)

## 2023-04-03 NOTE — Unmapped (Signed)
Called patient to follow up on critical calcium level of 5.4.  Pt continued to deny any symptoms of numbness, tingling or muscle cramping.  Pt advised of complications of a critically low calcium level and that he should present to ED for treatment.  He has denied recommendation at this time.

## 2023-04-03 NOTE — Unmapped (Signed)
Received a page about a critical lab result from [facility] (Ref # [id number]) about a total calcium level of 5.4, Phosphorous WNL. No magnesium check. Cr ~1.7.    Called the patient & told him this finding & asked about any concerning features such as numbness, tingling, or weakness to which he denied. 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 I asked him when the last time he took his Tums was & it was around 23:00.    The lab was collected around 09:35 per [facility]. Since that time he would have taken 12 tabs of his Tums being 4,800 mg of Ca. However, with 40% being elemental this would be ~1920 mg.    In estimating what his serum calcium would be roughly after these doses would be in the range of ideally ~6.3. However, I did recommend him going to the ED to get evaluated & possibly receive IV calcium if needed. Although having no symptoms of hypocalcemia, I was worried about electrical activity of his heart. Despite my concerns, patient refused going to the hospital today. Thus, I recommended that he take an additional 2 tablets of Tums (800 mg of Ca with 320 mg being elemental). With the previous days Tums + this additional dose his total Ca should hopefully be around ~6.46.    Will notify clinic about urgent call back in the morning with repeat labs _ symptom check. Patient told to call with any concerns. He has also been instructed previously on when to go the ED for evaluation.    Laurette Schimke Mort Sawyers, D.O.  PGY-IV  Nephrology and Hypertension

## 2023-04-04 DIAGNOSIS — Z940 Kidney transplant status: Principal | ICD-10-CM

## 2023-04-04 NOTE — Unmapped (Signed)
VM left 04/03/23 from Brynda Rim at [facility], phone number provided: [phone]. Would like call back to discuss validity of doctors note regarding period of being out of work. Coordinator notified.

## 2023-04-04 NOTE — Unmapped (Signed)
Reviewed recent labs and tac level with [name], pt advised to increase tad dose to 14 mg bid.  Pt verbalized understanding and requested to repeat labs this week for calcium levels.

## 2023-04-05 DIAGNOSIS — Z940 Kidney transplant status: Principal | ICD-10-CM

## 2023-04-05 MED ORDER — MINOXIDIL 2.5 MG TABLET
ORAL_TABLET | Freq: Every day | ORAL | 0 refills | 30.00 days
Start: 2023-04-05 — End: 2023-05-05

## 2023-04-05 MED FILL — BIKTARVY 50 MG-200 MG-25 MG TABLET: ORAL | 30 days supply | Qty: 30 | Fill #5

## 2023-04-05 MED FILL — PREDNISONE 5 MG TABLET: ORAL | 30 days supply | Qty: 30 | Fill #4

## 2023-04-05 MED FILL — VENTOLIN HFA 90 MCG/ACTUATION AEROSOL INHALER: RESPIRATORY_TRACT | 17 days supply | Qty: 18 | Fill #4

## 2023-04-05 NOTE — Unmapped (Signed)
[facility] Refill Coordination Note    Specialty Medication(s) to be Shipped:   Infectious Disease: Biktarvy and Transplant: tacrolimus 5mg , Prednisone 5mg , and azathioprine 100 mg    Other medication(s) to be shipped:  calcitriol , carvedilol , minodoxil and ventolin     Christian Brown, DOB: 1981-01-01  Phone: [phone] (home)       All above HIPAA information was verified with patient.     Was a Nurse, learning disability used for this call? No    Completed refill call assessment today to schedule patient's medication shipment from the [facility]  [phone]).  All relevant notes have been reviewed.     Specialty medication(s) and dose(s) confirmed: Regimen is correct and unchanged.   Changes to medications: Veryl reports no changes at this time.  Changes to insurance: No  New side effects reported not previously addressed with a pharmacist or physician: None reported  Questions for the pharmacist: No    Confirmed patient received a Conservation officer, historic buildings and a Surveyor, mining with first shipment. The patient will receive a drug information handout for each medication shipped and additional FDA Medication Guides as required.       DISEASE/MEDICATION-SPECIFIC INFORMATION        N/A    SPECIALTY MEDICATION ADHERENCE     Medication Adherence    Patient reported X missed doses in the last month: 0  Specialty Medication: azathioprine 100 mg tablet (AZASAN)  Patient is on additional specialty medications: Yes  Additional Specialty Medications: BIKTARVY 50-200-25 mg tablet (bictegrav-emtricit-tenofov ala)  Patient Reported Additional Medication X Missed Doses in the Last Month: 0  Patient is on more than two specialty medications: Yes  Specialty Medication: predniSONE 5 MG tablet (DELTASONE)  Patient Reported Additional Medication X Missed Doses in the Last Month: 0  Specialty Medication: tacrolimus 5 MG capsule (PROGRAF)  Patient Reported Additional Medication X Missed Doses in the Last Month: 0              Were doses missed due to medication being on hold? No    Biktarvy 50-200-25 mg: 1 days of medicine on hand   tacrolimus 5 mg: 4 days of medicine on hand   azathioprine 100 mg: 7 days of medicine on hand   prednisone 5 mg: 7 days of medicine on hand     REFERRAL TO PHARMACIST     Referral to the pharmacist: Not needed      SHIPPING     Shipping address confirmed in Epic.       Delivery Scheduled: Yes, Expected medication delivery date: 04/06/2023.     Medication will be delivered via UPS to the prescription address in Epic WAM.    Quintella Reichert   [facility]  Specialty Technician

## 2023-04-06 LAB — RENAL FUNCTION PANEL
ALBUMIN: 3.9 g/dL — ABNORMAL LOW (ref 4.1–5.1)
BLOOD UREA NITROGEN: 29 mg/dL — ABNORMAL HIGH (ref 6–24)
BUN / CREAT RATIO: 17 (ref 9–20)
CALCIUM: 7.2 mg/dL — ABNORMAL LOW (ref 8.7–10.2)
CHLORIDE: 103 mmol/L (ref 96–106)
CO2: 20 mmol/L (ref 20–29)
CREATININE: 1.73 mg/dL — ABNORMAL HIGH (ref 0.76–1.27)
EGFR: 51 mL/min/{1.73_m2} — ABNORMAL LOW
GLUCOSE: 121 mg/dL — ABNORMAL HIGH (ref 70–99)
PHOSPHORUS, SERUM: 3.2 mg/dL (ref 2.8–4.1)
POTASSIUM: 4.7 mmol/L (ref 3.5–5.2)
SODIUM: 138 mmol/L (ref 134–144)

## 2023-04-06 LAB — CBC W/ DIFFERENTIAL
BASOPHILS ABSOLUTE COUNT: 0 10*3/uL (ref 0.0–0.2)
BASOPHILS RELATIVE PERCENT: 1 %
EOSINOPHILS ABSOLUTE COUNT: 0.1 10*3/uL (ref 0.0–0.4)
EOSINOPHILS RELATIVE PERCENT: 3 %
HEMATOCRIT: 35.1 % — ABNORMAL LOW (ref 37.5–51.0)
HEMOGLOBIN: 11.9 g/dL — ABNORMAL LOW (ref 13.0–17.7)
LYMPHOCYTES ABSOLUTE COUNT: 0.9 10*3/uL (ref 0.7–3.1)
LYMPHOCYTES RELATIVE PERCENT: 43 %
MEAN CORPUSCULAR HEMOGLOBIN CONC: 33.9 g/dL (ref 31.5–35.7)
MEAN CORPUSCULAR HEMOGLOBIN: 35.3 pg — ABNORMAL HIGH (ref 26.6–33.0)
MEAN CORPUSCULAR VOLUME: 104 fL — ABNORMAL HIGH (ref 79–97)
MONOCYTES ABSOLUTE COUNT: 0.3 10*3/uL (ref 0.1–0.9)
MONOCYTES RELATIVE PERCENT: 13 %
NEUTROPHILS ABSOLUTE COUNT: 0.8 10*3/uL — ABNORMAL LOW (ref 1.4–7.0)
NEUTROPHILS RELATIVE PERCENT: 40 %
NUCLEATED RED BLOOD CELLS: 2 % — ABNORMAL HIGH (ref 0–0)
PLATELET COUNT: 181 10*3/uL (ref 150–450)
RED BLOOD CELL COUNT: 3.37 x10E6/uL — ABNORMAL LOW (ref 4.14–5.80)
RED CELL DISTRIBUTION WIDTH: 14.2 % (ref 11.6–15.4)
WHITE BLOOD CELL COUNT: 2 10*3/uL — CL (ref 3.4–10.8)

## 2023-04-06 LAB — MAGNESIUM: MAGNESIUM: 1.3 mg/dL — ABNORMAL LOW (ref 1.6–2.3)

## 2023-04-06 LAB — TACROLIMUS LEVEL: TACROLIMUS BLOOD: 3.2 ng/mL — ABNORMAL LOW (ref 5.0–20.0)

## 2023-04-06 NOTE — Unmapped (Signed)
Med refill

## 2023-04-09 DIAGNOSIS — Z940 Kidney transplant status: Principal | ICD-10-CM

## 2023-04-09 DIAGNOSIS — Z79899 Other long term (current) drug therapy: Principal | ICD-10-CM

## 2023-04-09 LAB — CBC W/ DIFFERENTIAL
BANDED NEUTROPHILS ABSOLUTE COUNT: 0.1 10*3/uL (ref 0.0–0.1)
BASOPHILS ABSOLUTE COUNT: 0 10*3/uL (ref 0.0–0.2)
BASOPHILS RELATIVE PERCENT: 0 %
EOSINOPHILS ABSOLUTE COUNT: 0 10*3/uL (ref 0.0–0.4)
EOSINOPHILS RELATIVE PERCENT: 1 %
HEMATOCRIT: 34.1 % — ABNORMAL LOW (ref 37.5–51.0)
HEMOGLOBIN: 11.6 g/dL — ABNORMAL LOW (ref 13.0–17.7)
IMMATURE GRANULOCYTES: 4 %
LYMPHOCYTES ABSOLUTE COUNT: 0.7 10*3/uL (ref 0.7–3.1)
LYMPHOCYTES RELATIVE PERCENT: 32 %
MEAN CORPUSCULAR HEMOGLOBIN CONC: 34 g/dL (ref 31.5–35.7)
MEAN CORPUSCULAR HEMOGLOBIN: 34.7 pg — ABNORMAL HIGH (ref 26.6–33.0)
MEAN CORPUSCULAR VOLUME: 102 fL — ABNORMAL HIGH (ref 79–97)
MONOCYTES ABSOLUTE COUNT: 0.4 10*3/uL (ref 0.1–0.9)
MONOCYTES RELATIVE PERCENT: 19 %
NEUTROPHILS ABSOLUTE COUNT: 1 10*3/uL — ABNORMAL LOW (ref 1.4–7.0)
NEUTROPHILS RELATIVE PERCENT: 44 %
NUCLEATED RED BLOOD CELLS: 1 % — ABNORMAL HIGH (ref 0–0)
PLATELET COUNT: 174 10*3/uL (ref 150–450)
RED BLOOD CELL COUNT: 3.34 x10E6/uL — ABNORMAL LOW (ref 4.14–5.80)
RED CELL DISTRIBUTION WIDTH: 13.9 % (ref 11.6–15.4)
WHITE BLOOD CELL COUNT: 2.3 10*3/uL — CL (ref 3.4–10.8)

## 2023-04-09 MED ORDER — MINOXIDIL 2.5 MG TABLET
ORAL_TABLET | Freq: Every day | ORAL | 0 refills | 30.00 days
Start: 2023-04-09 — End: 2023-05-09

## 2023-04-09 NOTE — Unmapped (Signed)
No longer caring for this patient, saw him while he was admitted.

## 2023-04-10 ENCOUNTER — Ambulatory Visit: Admit: 2023-04-10 | Discharge: 2023-04-11 | Payer: MEDICARE

## 2023-04-10 DIAGNOSIS — Z9089 Acquired absence of other organs: Principal | ICD-10-CM

## 2023-04-10 DIAGNOSIS — Z9889 Other specified postprocedural states: Principal | ICD-10-CM

## 2023-04-10 LAB — RENAL FUNCTION PANEL
ALBUMIN: 3.9 g/dL — ABNORMAL LOW (ref 4.1–5.1)
BLOOD UREA NITROGEN: 26 mg/dL — ABNORMAL HIGH (ref 6–24)
BUN / CREAT RATIO: 15 (ref 9–20)
CALCIUM: 7.5 mg/dL — ABNORMAL LOW (ref 8.7–10.2)
CHLORIDE: 108 mmol/L — ABNORMAL HIGH (ref 96–106)
CO2: 21 mmol/L (ref 20–29)
CREATININE: 1.79 mg/dL — ABNORMAL HIGH (ref 0.76–1.27)
EGFR: 49 mL/min/{1.73_m2} — ABNORMAL LOW
GLUCOSE: 90 mg/dL (ref 70–99)
PHOSPHORUS, SERUM: 3.5 mg/dL (ref 2.8–4.1)
POTASSIUM: 4.6 mmol/L (ref 3.5–5.2)
SODIUM: 141 mmol/L (ref 134–144)

## 2023-04-10 LAB — TACROLIMUS LEVEL: TACROLIMUS BLOOD: 4.6 ng/mL — ABNORMAL LOW (ref 5.0–20.0)

## 2023-04-10 LAB — PARATHYROID HORMONE (PTH): PARATHYROID HORMONE INTACT: 182.3 pg/mL — ABNORMAL HIGH (ref 18.4–80.1)

## 2023-04-10 LAB — CALCIUM: CALCIUM: 7.7 mg/dL — ABNORMAL LOW (ref 8.7–10.4)

## 2023-04-10 LAB — MAGNESIUM: MAGNESIUM: 1.6 mg/dL (ref 1.6–2.3)

## 2023-04-10 MED FILL — AZATHIOPRINE 100 MG TABLET: ORAL | 30 days supply | Qty: 30 | Fill #1

## 2023-04-10 NOTE — Unmapped (Signed)
Pt would like a note stating he can go back to work on 04/15/23, if appropriate.
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
## 2023-04-11 DIAGNOSIS — Z940 Kidney transplant status: Principal | ICD-10-CM

## 2023-04-11 MED ORDER — TACROLIMUS XR 4 MG TABLET,EXTENDED RELEASE 24 HR
ORAL_TABLET | Freq: Two times a day (BID) | ORAL | 11 refills | 30.00 days | Status: CP
Start: 2023-04-11 — End: ?
  Filled 2023-04-17: qty 180, 30d supply, fill #0

## 2023-04-11 NOTE — Unmapped (Signed)
Reviewed recent labs and tac level 4.6 with [name], pt advised to increase tacrolimus dose to 9 mg TID and repeat labs next week.  Pt verbalized understanding.  Will send script for envarsus coverage and pt open to switching once approved.

## 2023-04-13 NOTE — Unmapped (Signed)
 Clinical Assessment Needed For: Dose Change  Medication: Envarsus XR 4mg  tablet  Last Fill Date/Day Supply: N/A / N/A  Copay $0  Was previous dose already scheduled to fill: No    Notes to Pharmacist: N/A

## 2023-04-16 DIAGNOSIS — Z940 Kidney transplant status: Principal | ICD-10-CM

## 2023-04-16 DIAGNOSIS — Z79899 Other long term (current) drug therapy: Principal | ICD-10-CM

## 2023-04-16 NOTE — Unmapped (Signed)
 This onboarding is for the following medication:  1) Envarsus      [facility]    Patient Onboarding/Medication Counseling    Christian Brown is a 43 y.o. male with a kidney transplant who I am counseling today on initiation of therapy.  I am speaking to the patient.    Was a Nurse, learning disability used for this call? No    Verified patient's date of birth / HIPAA.    Specialty medication(s) to be sent: Transplant: Envarsus 4mg       Non-specialty medications/supplies to be sent: none      Medications not needed at this time: none         Envarsus (tacrolimus XR)    Medication & Administration     Dosage: Take 3 tablets (12mg  total) two times  a day.     Administration:   Take in the morning on empty stomach - 1 hour before or 2 hours after breakfast  Take with drink of water  Do not crush, break, or chew tablets    Adherence/Missed dose instructions:  Take a missed dose as soon as you think about it.  If it has been more than 15 hours since missed dose, skip the missed dose and go back to your regular time  Report all missed doses to transplant coordinator    Goals of Therapy     To prevent organ rejection    Side Effects & Monitoring Parameters     Common side effects  Fatigue  Headache  Stuffy nose or sore throat  Nausea, vomiting, stomach pain, diarrhea, constipation  Heartburn  Back or joint pain  Increased risk of infection    The following side effects should be reported to the provider:  Allergic reaction  Kidney issues (change in quantity or urine passed, blood in urine, or weight gain)  High blood pressure (dizziness, change in eyesight, headache)  Electrolyte issues (change in mood, confusion, muscle pain, or weakness)  Abnormal breathing  Shakiness  Unexplained bleeding or bruising (gums bleeding, blood in urine, nosebleeds, any abnormal bleeding)  Signs of infection    Monitoring Parameters  Renal function  Liver function  Glucose levels  Blood pressure  Tacrolimus trough levels      Contraindications, Warnings, & Precautions     Black Box Warning: Infections - immunosuppressant agents increase the risk of infection that may lead to hospitalization or death  Black Box Warning: Malignancy - immunosuppressant agents may be associated with the development of malignancies that may lead to hospitalization or death.  Limit or avoid sun and ultraviolet light exposure, use appropriate sun protection  Myocardial hypertrophy -avoid use in patients with congenital long QT syndrome  Diabetes mellitus - the risk for new-onset diabetes and insulin-dependent post-transplant diabetes mellitus is increased with tacrolimus use after transplantation  GI perforation  Hyperkalemia  Hypertension  Nephrotoxicity  Neurotoxicity    Drug/Food Interactions     Medication list reviewed in Epic. No drug interactions identified.   Due to amount and severity of drug interactions, report ALL medications starts, discontinuations, and changes to transplant coordinator prior to making the change  Avoid alcohol  Avoid grapefruit or grapefruit juice  Avoid live vaccines    Storage, Handling Precautions, & Disposal     Store at room temperature  Keep away from children and pets      Current Medications (including OTC/herbals), Comorbidities and Allergies     Current Outpatient Medications   Medication Sig Dispense Refill  albuterol HFA 90 mcg/actuation inhaler Inhale 1-2 puffs every four (4) to six (6) hours as needed for wheezing or shortness of breath. 18 g 11    azathioprine (AZASAN) 100 mg tablet Take 1 tablet (100 mg total) by mouth daily. 90 tablet 3    bictegrav-emtricit-tenofov ala (BIKTARVY) 50-200-25 mg tablet Take 1 tablet by mouth every morning. 30 tablet 11    calcitriol (ROCALTROL) 0.5 MCG capsule Take 4 capsules (2 mcg total) by mouth two (2) times a day. 240 capsule 11    carvedilol (COREG) 12.5 MG tablet Take 1 tablet (12.5 mg total) by mouth two (2) times a day. 180 tablet 3    DIETARY SUPPLEMENT,MISC COMB14 ORAL Taking magnesium gummy supplement once daily. Dosage strength & product unknown      empty container Misc Use as directed with Zarxio (Patient not taking: Reported on 03/21/2023) 1 each 0    ergocalciferol-1,250 mcg, 50,000 unit, (DRISDOL) 1,250 mcg (50,000 unit) capsule Take 1 capsule (1,250 mcg total) by mouth once a week. 12 capsule 1    filgrastim-sndz (ZARXIO) 480 mcg/0.8 mL Syrg Inject 0.8 mL (480 mcg total) under the skin once a week. Administer only as directed by transplant team. (Patient not taking: Reported on 03/21/2023) 3.2 mL 1    HYDROcodone-acetaminophen (NORCO) 5-325 mg per tablet Take 1 tablet by mouth daily as needed for pain.      minoxidil (LONITEN) 2.5 MG tablet Take 1 tablet (2.5 mg total) by mouth daily. 30 tablet 0    pantoprazole (PROTONIX) 40 MG tablet Take 1 tablet (40 mg total) by mouth daily as needed. 90 tablet 1    predniSONE (DELTASONE) 5 MG tablet Take 1 tablet (5 mg total) by mouth in the morning. 90 tablet 3    tacrolimus (ENVARSUS XR) 4 mg Tb24 extended release tablet Take 3 tablets (12 mg total) by mouth two (2) times a day. 180 tablet 11    tacrolimus (PROGRAF) 1 MG capsule Take 1 capsule (1 mg total) by mouth two (2) times a day. Take in addition to 5 mg capsules for total daily dose of 11 mg twice daily 180 capsule 3    tacrolimus (PROGRAF) 5 MG capsule Take 2 capsules (10 mg total) by mouth two (2) times a day. Take in addition to 1 mg capsule for total daily dose of 11 mg twice daily 360 capsule 3     No current facility-administered medications for this visit.       Allergies   Allergen Reactions    Amlodipine Hives, Palpitations, Rash and Other (See Comments)     Causes fatigue      Doxercalciferol Hives    Fish Containing Products Hives and Shortness Of Breath     Please do not serve fish for meals. Highly allergic         Iron Sucrose Hives    Labetalol Itching and Rash    Metoclopramide Hives and Other (See Comments)     Causes ticks/ jerks    Metoprolol Hives    Nifedipine Hives, Itching and Shortness Of Breath    Other Swelling     Suture material, localized swelling at closure site    Sulfamethoxazole-Trimethoprim Rash and Other (See Comments)     Renal failure      Vancomycin Hives and Other (See Comments)     Patient states vancomycin caused kidney injury    Calcitonin     Peanut Oil Other (See Comments)    Potassium Clavulanate  Azathioprine Itching and Rash    Chlorhexidine Itching     Pre-op wipes only    Clindamycin Other (See Comments) and Palpitations     Chest pain    Hydralazine Itching and Dizziness           Sensipar [Cinacalcet] Hives       Patient Active Problem List   Diagnosis    Asthma    CMV (cytomegalovirus infection) (CMS-HCC)    Essential hypertension    HIV infection (CMS-HCC)    OSA (obstructive sleep apnea)    Other specified disorders of parathyroid gland (CMS-HCC)    Multiple drug allergies    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)    Dialysis AV fistula malfunction (CMS-HCC)    Acute blood loss anemia    Melena    Abdominal pain    HIV nephropathy (CMS-HCC)    Anemia of chronic disease    GERD (gastroesophageal reflux disease)    Chest pressure    Chronic deep vein thrombosis (DVT) of femoral vein of left lower extremity (CMS-HCC)    Chronic diarrhea    Dependence on renal dialysis (CMS-HCC)    Left ventricular hypertrophy    Mild intermittent asthma without complication    Mitral valve disorder    Neuropathy involving both lower extremities    Nonrheumatic mitral valve stenosis    Pulmonary hypertension (CMS-HCC)    Renovascular hypertension    Iron deficiency anemia    End stage renal failure on dialysis (CMS-HCC)    History of DVT (deep vein thrombosis)    Transplanted kidney    History of anemia due to CKD    Anemia due to stage 4 chronic kidney disease (CMS-HCC)    Other neutropenia (CMS-HCC)    Secondary renal hyperparathyroidism (CMS-HCC)    Acute kidney injury superimposed on chronic kidney disease (CMS-HCC)    Hemodialysis AV fistula thrombosis (CMS-HCC)    Disorder of electrolytes    S/P parathyroidectomy    Hypocalcemia    Hypomagnesemia    Hungry bone syndrome       Medication list has been reviewed and updated in Epic: Yes    Allergies have been reviewed and updated in Epic: Yes    Appropriateness of Therapy     Acute infections noted within Epic:  No active infections  Patient reported infection: None    Is the medication and dose appropriate based on diagnosis, medication list, comorbidities, allergies, medical history, patient???s ability to self-administer the medication, and therapeutic goals? Yes    Prescription has been clinically reviewed: Yes      Baseline Quality of Life Assessment      How many days over the past month did your kidney transplant  keep you from your normal activities? For example, brushing your teeth or getting up in the morning. 0    Financial Information     Medication Assistance provided: None Required    Anticipated copay of $0 reviewed with patient. Verified delivery address.    Delivery Information     Scheduled delivery date: 04/18/23    Expected start date: 04/19/23      Medication will be delivered via UPS to the prescription address in [facility].  This shipment will require a signature.      Explained the services we provide at [facility] and that each month we would call to set up refills.  Stressed importance of returning phone calls so that we could  ensure they receive their medications in time each month.  Informed patient that we should be setting up refills 7-10 days prior to when they will run out of medication.  A pharmacist will reach out to perform a clinical assessment periodically.  Informed patient that a welcome packet, containing information about our pharmacy and other support services, a Notice of Privacy Practices, and a drug information handout will be sent.      The patient or caregiver noted above participated in the development of this care plan and knows that they can request review of or adjustments to the care plan at any time.      Patient or caregiver verbalized understanding of the above information as well as how to contact the pharmacy at [phone] option 4 with any questions/concerns.  The pharmacy is open Monday through Friday 8:30am-4:30pm.  A pharmacist is available 24/7 via pager to answer any clinical questions they may have.    Patient Specific Needs     Does the patient have any physical, cognitive, or cultural barriers? No    Does the patient have adequate living arrangements? (i.e. the ability to store and take their medication appropriately) Yes    Did you identify any home environmental safety or security hazards? No    Patient prefers to have medications discussed with  Patient     Is the patient or caregiver able to read and understand education materials at a high school level or above? Yes    Patient's primary language is  English     Is the patient high risk? No    Does the patient have an additional or emergency contact listed in their chart? Yes    SOCIAL DETERMINANTS OF HEALTH     At the [facility], we have learned that life circumstances - like trouble affording food, housing, utilities, or transportation can affect the health of many of our patients.   That is why we wanted to ask: are you currently experiencing any life circumstances that are negatively impacting your health and/or quality of life? Patient declined to answer    Social Drivers of Health     Food Insecurity: No Food Insecurity (09/06/2022)    Hunger Vital Sign     Worried About Running Out of Food in the Last Year: Never true     Ran Out of Food in the Last Year: Never true   Internet Connectivity: Not on file   Housing/Utilities: Low Risk  (09/06/2022)    Housing/Utilities     Within the past 12 months, have you ever stayed: outside, in a car, in a tent, in an overnight shelter, or temporarily in someone else's home (i.e. couch-surfing)?: No     Are you worried about losing your housing?: No     Within the past 12 months, have you been unable to get utilities (heat, electricity) when it was really needed?: No   Tobacco Use: Low Risk  (04/10/2023)    Patient History     Smoking Tobacco Use: Never     Smokeless Tobacco Use: Never     Passive Exposure: Not on file   Transportation Needs: No Transportation Needs (09/06/2022)    PRAPARE - Transportation     Lack of Transportation (Medical): No     Lack of Transportation (Non-Medical): No   Alcohol Use: Not At Risk (09/05/2018)    Received from [facility], [facility]    AUDIT-C     Frequency of  Alcohol Consumption: Never     Average Number of Drinks: Not on file     Frequency of Binge Drinking: Not on file   Interpersonal Safety: Not on file   Physical Activity: Not on file   Intimate Partner Violence: Patient Declined (03/05/2022)    Received from [place] [facility] [place], [facility] [place] [facility], Afraid, Rape, and Kick questionnaire     Fear of Current or Ex-Partner: Patient declined     Emotionally Abused: Patient declined     Physically Abused: Patient declined     Sexually Abused: Patient declined   Stress: Not on file   Substance Use: Not on file (01/05/2023)   Social Connections: Unknown (07/07/2021)    Received [facility], [facility]    Social Network     Social Network: Not on file   Financial Resource Strain: Low Risk  (09/06/2022)    Overall Financial Resource Strain (CARDIA)     Difficulty of Paying Living Expenses: Not hard at all   Depression: Not at risk (11/08/2021)    Received from [employer], [facility]    PHQ-2     Depression Risk: 0   Health Literacy: Not on file       Would you be willing to receive help with any of the needs that you have identified today? Not applicable       Christian Brown, [facility]  [facility] Specialty Pharmacist

## 2023-04-17 DIAGNOSIS — Z940 Kidney transplant status: Principal | ICD-10-CM

## 2023-04-17 LAB — CBC W/ DIFFERENTIAL
BANDED NEUTROPHILS ABSOLUTE COUNT: 0.1 10*3/uL (ref 0.0–0.1)
BASOPHILS ABSOLUTE COUNT: 0 10*3/uL (ref 0.0–0.2)
BASOPHILS RELATIVE PERCENT: 0 %
EOSINOPHILS ABSOLUTE COUNT: 0 10*3/uL (ref 0.0–0.4)
EOSINOPHILS RELATIVE PERCENT: 0 %
HEMATOCRIT: 35.3 % — ABNORMAL LOW (ref 37.5–51.0)
HEMOGLOBIN: 11.8 g/dL — ABNORMAL LOW (ref 13.0–17.7)
IMMATURE GRANULOCYTES: 2 %
LYMPHOCYTES ABSOLUTE COUNT: 0.9 10*3/uL (ref 0.7–3.1)
LYMPHOCYTES RELATIVE PERCENT: 25 %
MEAN CORPUSCULAR HEMOGLOBIN CONC: 33.4 g/dL (ref 31.5–35.7)
MEAN CORPUSCULAR HEMOGLOBIN: 34.3 pg — ABNORMAL HIGH (ref 26.6–33.0)
MEAN CORPUSCULAR VOLUME: 103 fL — ABNORMAL HIGH (ref 79–97)
MONOCYTES ABSOLUTE COUNT: 0.5 10*3/uL (ref 0.1–0.9)
MONOCYTES RELATIVE PERCENT: 14 %
NEUTROPHILS ABSOLUTE COUNT: 2 10*3/uL (ref 1.4–7.0)
NEUTROPHILS RELATIVE PERCENT: 59 %
NUCLEATED RED BLOOD CELLS: 1 % — ABNORMAL HIGH (ref 0–0)
PLATELET COUNT: 160 10*3/uL (ref 150–450)
RED BLOOD CELL COUNT: 3.44 x10E6/uL — ABNORMAL LOW (ref 4.14–5.80)
RED CELL DISTRIBUTION WIDTH: 14 % (ref 11.6–15.4)
WHITE BLOOD CELL COUNT: 3.4 10*3/uL (ref 3.4–10.8)

## 2023-04-18 DIAGNOSIS — Z940 Kidney transplant status: Principal | ICD-10-CM

## 2023-04-18 LAB — RENAL FUNCTION PANEL
ALBUMIN: 4.2 g/dL (ref 4.1–5.1)
BLOOD UREA NITROGEN: 26 mg/dL — ABNORMAL HIGH (ref 6–24)
BUN / CREAT RATIO: 14 (ref 9–20)
CALCIUM: 7.3 mg/dL — ABNORMAL LOW (ref 8.7–10.2)
CHLORIDE: 107 mmol/L — ABNORMAL HIGH (ref 96–106)
CO2: 18 mmol/L — ABNORMAL LOW (ref 20–29)
CREATININE: 1.86 mg/dL — ABNORMAL HIGH (ref 0.76–1.27)
EGFR: 46 mL/min/{1.73_m2} — ABNORMAL LOW
GLUCOSE: 83 mg/dL (ref 70–99)
PHOSPHORUS, SERUM: 3.6 mg/dL (ref 2.8–4.1)
POTASSIUM: 4.8 mmol/L (ref 3.5–5.2)
SODIUM: 141 mmol/L (ref 134–144)

## 2023-04-18 LAB — URINALYSIS WITH MICROSCOPY
BILIRUBIN UA: NEGATIVE
BLOOD UA: NEGATIVE
GLUCOSE UA: NEGATIVE
KETONES UA: NEGATIVE
LEUKOCYTE ESTERASE UA: NEGATIVE
NITRITE UA: NEGATIVE
PH UA: 6 (ref 5.0–7.5)
SPECIFIC GRAVITY UA: 1.026 (ref 1.005–1.030)
UROBILINOGEN UA: 0.2 mg/dL (ref 0.2–1.0)

## 2023-04-18 LAB — MAGNESIUM: MAGNESIUM: 1.7 mg/dL (ref 1.6–2.3)

## 2023-04-18 LAB — TACROLIMUS LEVEL: TACROLIMUS BLOOD: 4.9 ng/mL — ABNORMAL LOW (ref 5.0–20.0)

## 2023-04-18 LAB — MICROSCOPIC EXAMINATION
BACTERIA: NONE SEEN
CASTS: NONE SEEN /LPF
EPITHELIAL CELLS (NON RENAL): NONE SEEN /HPF (ref 0–10)
RBC URINE: NONE SEEN /HPF (ref 0–2)
WBC URINE: NONE SEEN /HPF (ref 0–5)

## 2023-04-19 DIAGNOSIS — Z940 Kidney transplant status: Principal | ICD-10-CM

## 2023-04-19 MED ORDER — MINOXIDIL 2.5 MG TABLET
ORAL_TABLET | Freq: Every day | ORAL | 3 refills | 90.00 days | Status: CP
Start: 2023-04-19 — End: 2024-04-18
  Filled 2023-04-20: qty 90, 90d supply, fill #0

## 2023-04-23 DIAGNOSIS — Z79899 Other long term (current) drug therapy: Principal | ICD-10-CM

## 2023-04-23 DIAGNOSIS — Z940 Kidney transplant status: Principal | ICD-10-CM

## 2023-04-23 NOTE — Unmapped (Signed)
 [facility] Clinical Assessment & Refill Coordination Note    Christian Brown, DOB: 12-24-1980  Phone: [phone] (home)     All above HIPAA information was verified with patient.     Was a Nurse, learning disability used for this call? No    Specialty Medication(s):   Transplant: Envarsus 4mg  and Prednisone 5mg      Current Outpatient Medications   Medication Sig Dispense Refill    albuterol HFA 90 mcg/actuation inhaler Inhale 1-2 puffs every four (4) to six (6) hours as needed for wheezing or shortness of breath. 18 g 11    azathioprine (AZASAN) 100 mg tablet Take 1 tablet (100 mg total) by mouth daily. 90 tablet 3    bictegrav-emtricit-tenofov ala (BIKTARVY) 50-200-25 mg tablet Take 1 tablet by mouth every morning. 30 tablet 11    calcitriol (ROCALTROL) 0.5 MCG capsule Take 4 capsules (2 mcg total) by mouth two (2) times a day. 240 capsule 11    carvedilol (COREG) 12.5 MG tablet Take 1 tablet (12.5 mg total) by mouth two (2) times a day. 180 tablet 3    DIETARY SUPPLEMENT,MISC COMB14 ORAL Taking magnesium gummy supplement once daily. Dosage strength & product unknown      empty container Misc Use as directed with Zarxio (Patient not taking: Reported on 03/21/2023) 1 each 0    ergocalciferol-1,250 mcg, 50,000 unit, (DRISDOL) 1,250 mcg (50,000 unit) capsule Take 1 capsule (1,250 mcg total) by mouth once a week. 12 capsule 1    filgrastim-sndz (ZARXIO) 480 mcg/0.8 mL Syrg Inject 0.8 mL (480 mcg total) under the skin once a week. Administer only as directed by transplant team. (Patient not taking: Reported on 03/21/2023) 3.2 mL 1    HYDROcodone-acetaminophen (NORCO) 5-325 mg per tablet Take 1 tablet by mouth daily as needed for pain.      minoxidil (LONITEN) 2.5 MG tablet Take 1 tablet (2.5 mg total) by mouth daily. 90 tablet 3    pantoprazole (PROTONIX) 40 MG tablet Take 1 tablet (40 mg total) by mouth daily as needed. 90 tablet 1    predniSONE (DELTASONE) 5 MG tablet Take 1 tablet (5 mg total) by mouth in the morning. 90 tablet 3    tacrolimus (ENVARSUS XR) 4 mg Tb24 extended release tablet Take 3 tablets (12 mg total) by mouth two (2) times a day. 180 tablet 11    tacrolimus (PROGRAF) 1 MG capsule Take 1 capsule (1 mg total) by mouth two (2) times a day. Take in addition to 5 mg capsules for total daily dose of 11 mg twice daily 180 capsule 3    tacrolimus (PROGRAF) 5 MG capsule Take 2 capsules (10 mg total) by mouth two (2) times a day. Take in addition to 1 mg capsule for total daily dose of 11 mg twice daily 360 capsule 3     No current facility-administered medications for this visit.        Changes to medications: Gar reports no changes at this time.    Medication list has been reviewed and updated in Epic: Yes    Allergies   Allergen Reactions    Amlodipine Hives, Palpitations, Rash and Other (See Comments)     Causes fatigue      Doxercalciferol Hives    Fish Containing Products Hives and Shortness Of Breath     Please do not serve fish for meals. Highly allergic         Iron Sucrose Hives    Labetalol Itching and  Rash    Metoclopramide Hives and Other (See Comments)     Causes ticks/ jerks    Metoprolol Hives    Nifedipine Hives, Itching and Shortness Of Breath    Other Swelling     Suture material, localized swelling at closure site    Sulfamethoxazole-Trimethoprim Rash and Other (See Comments)     Renal failure      Vancomycin Hives and Other (See Comments)     Patient states vancomycin caused kidney injury    Calcitonin     Peanut Oil Other (See Comments)    Potassium Clavulanate     Azathioprine Itching and Rash    Chlorhexidine Itching     Pre-op wipes only    Clindamycin Other (See Comments) and Palpitations     Chest pain    Hydralazine Itching and Dizziness           Sensipar [Cinacalcet] Hives       Changes to allergies: No    Allergies have been reviewed and updated in Epic: Yes    SPECIALTY MEDICATION ADHERENCE     Envarsus Xr  4 mg: 25 days of medicine on hand   Prednisone 5 mg: 0 days of medicine on hand     Medication Adherence    Patient reported X missed doses in the last month: 0  Specialty Medication: Prednisone 5mg   Patient is on additional specialty medications: Yes  Additional Specialty Medications: Envarsus Xr 4mg   Patient Reported Additional Medication X Missed Doses in the Last Month: 0  Patient is on more than two specialty medications: No          Specialty medication(s) dose(s) confirmed: Regimen is correct and unchanged.     Are there any concerns with adherence? No    Adherence counseling provided? Not needed    CLINICAL MANAGEMENT AND INTERVENTION      Clinical Benefit Assessment:    Do you feel the medicine is effective or helping your condition? Yes    Clinical Benefit counseling provided? Not needed    Adverse Effects Assessment:    Are you experiencing any side effects? No    Are you experiencing difficulty administering your medicine? No    Quality of Life Assessment:    Quality of Life    Rheumatology  Oncology  Dermatology  Cystic Fibrosis          How many days over the past month did your kidney transplant  keep you from your normal activities? For example, brushing your teeth or getting up in the morning. 0    Have you discussed this with your provider? Not needed    Acute Infection Status:    Acute infections noted within Epic:  No active infections  Patient reported infection: None    Therapy Appropriateness:    Is therapy appropriate based on current medication list, adverse reactions, adherence, clinical benefit and progress toward achieving therapeutic goals? Yes, therapy is appropriate and should be continued     DISEASE/MEDICATION-SPECIFIC INFORMATION      N/A    Solid Organ Transplant: Not Applicable    PATIENT SPECIFIC NEEDS     Does the patient have any physical, cognitive, or cultural barriers? No    Is the patient high risk? No    Did the patient require a clinical intervention? No    Does the patient require physician intervention or other additional services (i.e., nutrition, smoking cessation, social work)? No    Does the patient have an additional or  emergency contact listed in their chart? Yes    SOCIAL DETERMINANTS OF HEALTH     At the [facility], we have learned that life circumstances - like trouble affording food, housing, utilities, or transportation can affect the health of many of our patients.   That is why we wanted to ask: are you currently experiencing any life circumstances that are negatively impacting your health and/or quality of life? Patient declined to answer    Social Drivers of Health     Food Insecurity: No Food Insecurity (09/06/2022)    Hunger Vital Sign     Worried About Running Out of Food in the Last Year: Never true     Ran Out of Food in the Last Year: Never true   Internet Connectivity: Not on file   Housing/Utilities: Low Risk  (09/06/2022)    Housing/Utilities     Within the past 12 months, have you ever stayed: outside, in a car, in a tent, in an overnight shelter, or temporarily in someone else's home (i.e. couch-surfing)?: No     Are you worried about losing your housing?: No     Within the past 12 months, have you been unable to get utilities (heat, electricity) when it was really needed?: No   Tobacco Use: Low Risk  (04/10/2023)    Patient History     Smoking Tobacco Use: Never     Smokeless Tobacco Use: Never     Passive Exposure: Not on file   Transportation Needs: No Transportation Needs (09/06/2022)    PRAPARE - Transportation     Lack of Transportation (Medical): No     Lack of Transportation (Non-Medical): No   Alcohol Use: Not At Risk (09/05/2018)    Received from [facility]    AUDIT-C     Frequency of Alcohol Consumption: Never     Average Number of Drinks: Not on file     Frequency of Binge Drinking: Not on file   Interpersonal Safety: Not on file   Physical Activity: Not on file   Intimate Partner Violence: Patient Declined (03/05/2022)    Received from [facility] [place], [facility], Afraid, Rape, and Kick questionnaire     Fear of Current or Ex-Partner: Patient declined     Emotionally Abused: Patient declined     Physically Abused: Patient declined     Sexually Abused: Patient declined   Stress: Not on file   Substance Use: Not on file (01/05/2023)   Social Connections: Unknown (07/07/2021)    Received from [facility], [facility]    Social Network     Social Network: Not on file   Financial Resource Strain: Low Risk  (09/06/2022)    Overall Financial Resource Strain (CARDIA)     Difficulty of Paying Living Expenses: Not hard at all   Depression: Not at risk (11/08/2021)    Received from [employer], [facility]    PHQ-2     Depression Risk: 0   Health Literacy: Not on file       Would you be willing to receive help with any of the needs that you have identified today? Not applicable       SHIPPING     Specialty Medication(s) to be Shipped:   Transplant: Prednisone 5mg     Other medication(s) to be shipped: No additional medications requested for fill at this time  Changes to insurance: No    Delivery Scheduled: Yes, Expected medication delivery date: 04/25/23.     Medication will be delivered via UPS to the confirmed prescription address in [facility].    The patient will receive a drug information handout for each medication shipped and additional FDA Medication Guides as required.  Verified that patient has previously received a Conservation officer, historic buildings and a Surveyor, mining.    The patient or caregiver noted above participated in the development of this care plan and knows that they can request review of or adjustments to the care plan at any time.      All of the patient's questions and concerns have been addressed.    Tera Helper, [facility]   [facility] Specialty Pharmacist

## 2023-04-25 LAB — CBC W/ DIFFERENTIAL
BANDED NEUTROPHILS ABSOLUTE COUNT: 0 10*3/uL (ref 0.0–0.1)
BASOPHILS ABSOLUTE COUNT: 0 10*3/uL (ref 0.0–0.2)
BASOPHILS RELATIVE PERCENT: 1 %
EOSINOPHILS ABSOLUTE COUNT: 0.1 10*3/uL (ref 0.0–0.4)
EOSINOPHILS RELATIVE PERCENT: 2 %
HEMATOCRIT: 37.6 % (ref 37.5–51.0)
HEMOGLOBIN: 12.6 g/dL — ABNORMAL LOW (ref 13.0–17.7)
IMMATURE GRANULOCYTES: 1 %
LYMPHOCYTES ABSOLUTE COUNT: 1 10*3/uL (ref 0.7–3.1)
LYMPHOCYTES RELATIVE PERCENT: 28 %
MEAN CORPUSCULAR HEMOGLOBIN CONC: 33.5 g/dL (ref 31.5–35.7)
MEAN CORPUSCULAR HEMOGLOBIN: 34.9 pg — ABNORMAL HIGH (ref 26.6–33.0)
MEAN CORPUSCULAR VOLUME: 104 fL — ABNORMAL HIGH (ref 79–97)
MONOCYTES ABSOLUTE COUNT: 0.5 10*3/uL (ref 0.1–0.9)
MONOCYTES RELATIVE PERCENT: 13 %
NEUTROPHILS ABSOLUTE COUNT: 2 10*3/uL (ref 1.4–7.0)
NEUTROPHILS RELATIVE PERCENT: 55 %
PLATELET COUNT: 163 10*3/uL (ref 150–450)
RED BLOOD CELL COUNT: 3.61 x10E6/uL — ABNORMAL LOW (ref 4.14–5.80)
RED CELL DISTRIBUTION WIDTH: 14 % (ref 11.6–15.4)
WHITE BLOOD CELL COUNT: 3.7 10*3/uL (ref 3.4–10.8)

## 2023-04-25 LAB — MAGNESIUM: MAGNESIUM: 1.6 mg/dL (ref 1.6–2.3)

## 2023-04-25 MED FILL — PREDNISONE 5 MG TABLET: ORAL | 30 days supply | Qty: 30 | Fill #5

## 2023-04-26 LAB — TACROLIMUS LEVEL: TACROLIMUS BLOOD: 13.4 ng/mL (ref 5.0–20.0)

## 2023-04-30 DIAGNOSIS — Z940 Kidney transplant status: Principal | ICD-10-CM

## 2023-04-30 DIAGNOSIS — Z79899 Other long term (current) drug therapy: Principal | ICD-10-CM

## 2023-04-30 LAB — MICROSCOPIC EXAMINATION
BACTERIA: NONE SEEN
CASTS: NONE SEEN /LPF
EPITHELIAL CELLS (NON RENAL): NONE SEEN /HPF (ref 0–10)
RBC URINE: NONE SEEN /HPF (ref 0–2)

## 2023-04-30 LAB — CBC W/ DIFFERENTIAL
BANDED NEUTROPHILS ABSOLUTE COUNT: 0.1 10*3/uL (ref 0.0–0.1)
BASOPHILS ABSOLUTE COUNT: 0 10*3/uL (ref 0.0–0.2)
BASOPHILS RELATIVE PERCENT: 1 %
EOSINOPHILS ABSOLUTE COUNT: 0.1 10*3/uL (ref 0.0–0.4)
EOSINOPHILS RELATIVE PERCENT: 3 %
HEMATOCRIT: 37.6 % (ref 37.5–51.0)
HEMOGLOBIN: 12.6 g/dL — ABNORMAL LOW (ref 13.0–17.7)
IMMATURE GRANULOCYTES: 2 %
LYMPHOCYTES ABSOLUTE COUNT: 0.8 10*3/uL (ref 0.7–3.1)
LYMPHOCYTES RELATIVE PERCENT: 26 %
MEAN CORPUSCULAR HEMOGLOBIN CONC: 33.5 g/dL (ref 31.5–35.7)
MEAN CORPUSCULAR HEMOGLOBIN: 33.5 pg — ABNORMAL HIGH (ref 26.6–33.0)
MEAN CORPUSCULAR VOLUME: 100 fL — ABNORMAL HIGH (ref 79–97)
MONOCYTES ABSOLUTE COUNT: 0.5 10*3/uL (ref 0.1–0.9)
MONOCYTES RELATIVE PERCENT: 15 %
NEUTROPHILS ABSOLUTE COUNT: 1.7 10*3/uL (ref 1.4–7.0)
NEUTROPHILS RELATIVE PERCENT: 53 %
PLATELET COUNT: 168 10*3/uL (ref 150–450)
RED BLOOD CELL COUNT: 3.76 x10E6/uL — ABNORMAL LOW (ref 4.14–5.80)
RED CELL DISTRIBUTION WIDTH: 13.7 % (ref 11.6–15.4)
WHITE BLOOD CELL COUNT: 3.1 10*3/uL — ABNORMAL LOW (ref 3.4–10.8)

## 2023-04-30 LAB — URINALYSIS WITH MICROSCOPY
BILIRUBIN UA: NEGATIVE
BLOOD UA: NEGATIVE
GLUCOSE UA: NEGATIVE
KETONES UA: NEGATIVE
LEUKOCYTE ESTERASE UA: NEGATIVE
NITRITE UA: NEGATIVE
PH UA: 6 (ref 5.0–7.5)
SPECIFIC GRAVITY UA: 1.021 (ref 1.005–1.030)
UROBILINOGEN UA: 0.2 mg/dL (ref 0.2–1.0)

## 2023-05-01 DIAGNOSIS — Z940 Kidney transplant status: Principal | ICD-10-CM

## 2023-05-01 LAB — RENAL FUNCTION PANEL
ALBUMIN: 4 g/dL — ABNORMAL LOW (ref 4.1–5.1)
BLOOD UREA NITROGEN: 24 mg/dL (ref 6–24)
BUN / CREAT RATIO: 13 (ref 9–20)
CALCIUM: 7.8 mg/dL — ABNORMAL LOW (ref 8.7–10.2)
CHLORIDE: 109 mmol/L — ABNORMAL HIGH (ref 96–106)
CO2: 18 mmol/L — ABNORMAL LOW (ref 20–29)
CREATININE: 1.87 mg/dL — ABNORMAL HIGH (ref 0.76–1.27)
EGFR: 46 mL/min/{1.73_m2} — ABNORMAL LOW
GLUCOSE: 81 mg/dL (ref 70–99)
PHOSPHORUS, SERUM: 4 mg/dL (ref 2.8–4.1)
POTASSIUM: 5.2 mmol/L (ref 3.5–5.2)
SODIUM: 140 mmol/L (ref 134–144)

## 2023-05-01 LAB — MAGNESIUM: MAGNESIUM: 1.5 mg/dL — ABNORMAL LOW (ref 1.6–2.3)

## 2023-05-01 LAB — TACROLIMUS LEVEL: TACROLIMUS BLOOD: 4.7 ng/mL — ABNORMAL LOW (ref 5.0–20.0)

## 2023-05-02 DIAGNOSIS — Z940 Kidney transplant status: Principal | ICD-10-CM

## 2023-05-03 DIAGNOSIS — Z940 Kidney transplant status: Principal | ICD-10-CM

## 2023-05-03 DIAGNOSIS — Z79899 Other long term (current) drug therapy: Principal | ICD-10-CM

## 2023-05-03 MED ORDER — TACROLIMUS XR 4 MG TABLET,EXTENDED RELEASE 24 HR
ORAL_TABLET | ORAL | 3 refills | 90.00 days | Status: CP
Start: 2023-05-03 — End: 2024-05-02

## 2023-05-03 NOTE — Unmapped (Signed)
 Called to discuss Envarsus levels being below goal. He is a rapid metabolizer. One option is to increase Envarsus. The other option is to use diltiazem as a booster (diltiazem would replace tacrolimus). 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 I will send the following via MyChart for reference:    Recapping our conversation:     You are a rapid metabolizer for tacrolimus.    For today:   Please increase the Envarsus to 4 tablets in the morning (16 mg AM dose) and 3 tablets in the evening (12 mg PM dose). Since it's afternoon now, please go ahead and take an extra tablet.    Option for your consideration:  - start diltiazem 120 mg daily (long acting formulation)  - STOP carvedilol 12.5 mg twice daily (the diltiazem will replace this for blood pressure purposes)  - go back to the Envarsus dose of 3 tablets (12 mg) twice daily  - repeat lab with morning tacrolimus trough level 5-7 days after making the change.     Goal tacrolimus level is 6-8 when checked before the morning tacrolimus (Envarsus) dose.     Please let me and Coleen know whether you prefer to try the diltiazem strategy. The main goal for this option is to reduce the total # of pills, and there may be an additional benefit to reduce off-target tacrolimus metabolites (this is a hard benefit to quantify).

## 2023-05-07 DIAGNOSIS — Z79899 Other long term (current) drug therapy: Principal | ICD-10-CM

## 2023-05-07 DIAGNOSIS — Z940 Kidney transplant status: Principal | ICD-10-CM

## 2023-05-08 LAB — CBC W/ DIFFERENTIAL
BANDED NEUTROPHILS ABSOLUTE COUNT: 0 10*3/uL (ref 0.0–0.1)
BASOPHILS ABSOLUTE COUNT: 0 10*3/uL (ref 0.0–0.2)
BASOPHILS RELATIVE PERCENT: 1 %
EOSINOPHILS ABSOLUTE COUNT: 0.1 10*3/uL (ref 0.0–0.4)
EOSINOPHILS RELATIVE PERCENT: 3 %
HEMATOCRIT: 42.3 % (ref 37.5–51.0)
HEMOGLOBIN: 14.2 g/dL (ref 13.0–17.7)
IMMATURE GRANULOCYTES: 1 %
LYMPHOCYTES ABSOLUTE COUNT: 1 10*3/uL (ref 0.7–3.1)
LYMPHOCYTES RELATIVE PERCENT: 25 %
MEAN CORPUSCULAR HEMOGLOBIN CONC: 33.6 g/dL (ref 31.5–35.7)
MEAN CORPUSCULAR HEMOGLOBIN: 33 pg (ref 26.6–33.0)
MEAN CORPUSCULAR VOLUME: 98 fL — ABNORMAL HIGH (ref 79–97)
MONOCYTES ABSOLUTE COUNT: 0.5 10*3/uL (ref 0.1–0.9)
MONOCYTES RELATIVE PERCENT: 12 %
NEUTROPHILS ABSOLUTE COUNT: 2.4 10*3/uL (ref 1.4–7.0)
NEUTROPHILS RELATIVE PERCENT: 58 %
NUCLEATED RED BLOOD CELLS: 1 % — ABNORMAL HIGH (ref 0–0)
PLATELET COUNT: 192 10*3/uL (ref 150–450)
RED BLOOD CELL COUNT: 4.3 x10E6/uL (ref 4.14–5.80)
RED CELL DISTRIBUTION WIDTH: 13.6 % (ref 11.6–15.4)
WHITE BLOOD CELL COUNT: 4.1 10*3/uL (ref 3.4–10.8)

## 2023-05-09 DIAGNOSIS — Z940 Kidney transplant status: Principal | ICD-10-CM

## 2023-05-09 LAB — RENAL FUNCTION PANEL
ALBUMIN: 4.1 g/dL (ref 4.1–5.1)
BLOOD UREA NITROGEN: 21 mg/dL (ref 6–24)
BUN / CREAT RATIO: 11 (ref 9–20)
CALCIUM: 7.7 mg/dL — ABNORMAL LOW (ref 8.7–10.2)
CHLORIDE: 108 mmol/L — ABNORMAL HIGH (ref 96–106)
CO2: 17 mmol/L — ABNORMAL LOW (ref 20–29)
CREATININE: 1.97 mg/dL — ABNORMAL HIGH (ref 0.76–1.27)
EGFR: 43 mL/min/{1.73_m2} — ABNORMAL LOW
GLUCOSE: 83 mg/dL (ref 70–99)
PHOSPHORUS, SERUM: 3.7 mg/dL (ref 2.8–4.1)
POTASSIUM: 4.8 mmol/L (ref 3.5–5.2)
SODIUM: 141 mmol/L (ref 134–144)

## 2023-05-09 LAB — URINALYSIS WITH MICROSCOPY

## 2023-05-09 LAB — TACROLIMUS LEVEL: TACROLIMUS BLOOD: 5.4 ng/mL (ref 5.0–20.0)

## 2023-05-09 LAB — MAGNESIUM: MAGNESIUM: 1.6 mg/dL (ref 1.6–2.3)

## 2023-05-09 NOTE — Unmapped (Signed)
 [facility] Specialty and Home Delivery Pharmacy Refill Coordination Note    Specialty Medication(s) to be Shipped:   Transplant: Prednisone 5mg , Calcitriol 0.5 mcg, and Biktarvy 50-200-25 mg    Other medication(s) to be shipped: VENTOLIN HFA 90 mcg/actuation inhaler (albuterol)     Christian Brown, DOB: 04-Nov-1980  Phone: [phone] (home)       All above HIPAA information was verified with patient.     Was a Nurse, learning disability used for this call? No    Completed refill call assessment today to schedule patient's medication shipment from the [facility] and Home Delivery Pharmacy  [phone]).  All relevant notes have been reviewed.     Specialty medication(s) and dose(s) confirmed: Regimen is correct and unchanged.   Changes to medications: Manoj reports no changes at this time.  Changes to insurance: No  New side effects reported not previously addressed with a pharmacist or physician: None reported  Questions for the pharmacist: No    Confirmed patient received a Conservation officer, historic buildings and a Surveyor, mining with first shipment. The patient will receive a drug information handout for each medication shipped and additional FDA Medication Guides as required.       DISEASE/MEDICATION-SPECIFIC INFORMATION        N/A    SPECIALTY MEDICATION ADHERENCE     Medication Adherence    Patient reported X missed doses in the last month: 0  Specialty Medication: bictegrav-emtricit-tenofov ala: BIKTARVY 50-200-25 mg tablet  Patient is on additional specialty medications: Yes  Additional Specialty Medications: predniSONE 5 MG tablet (DELTASONE)  Patient is on more than two specialty medications: Yes  Specialty Medication: calcitriol 0.5 MCG capsule (ROCALTROL)  Any gaps in refill history greater than 2 weeks in the last 3 months: no  Demonstrates understanding of importance of adherence: yes              Were doses missed due to medication being on hold? No     predniSONE 5  mg: 7 days of medicine on hand   BIKTARVY 50-200-25  mg: 7 days of medicine on hand   calcitriol 0.5   mg: 3 days of medicine on hand       REFERRAL TO PHARMACIST     Referral to the pharmacist: Not needed      [facility]     Shipping address confirmed in Epic.     Cost and Payment: Patient has a copay of $7.40. They are aware and have authorized the pharmacy to charge the credit card on file.    Delivery Scheduled: Yes, Expected medication delivery date: 05/11/23.     Medication will be delivered via UPS to the prescription address in Epic WAM.    Moshe Salisbury   [facility] Specialty and Home Delivery Pharmacy  Specialty Technician

## 2023-05-10 MED FILL — CALCITRIOL 0.5 MCG CAPSULE: ORAL | 30 days supply | Qty: 240 | Fill #1

## 2023-05-10 MED FILL — PREDNISONE 5 MG TABLET: ORAL | 30 days supply | Qty: 30 | Fill #6

## 2023-05-10 MED FILL — BIKTARVY 50 MG-200 MG-25 MG TABLET: ORAL | 30 days supply | Qty: 30 | Fill #6

## 2023-05-10 MED FILL — VENTOLIN HFA 90 MCG/ACTUATION AEROSOL INHALER: RESPIRATORY_TRACT | 17 days supply | Qty: 18 | Fill #5

## 2023-05-11 DIAGNOSIS — Z940 Kidney transplant status: Principal | ICD-10-CM

## 2023-05-11 NOTE — Unmapped (Signed)
 Called patient regarding adding a renal ultrasound to his April follow up with Dr.Kotzen. Left voice message.

## 2023-05-14 DIAGNOSIS — Z79899 Other long term (current) drug therapy: Principal | ICD-10-CM

## 2023-05-14 DIAGNOSIS — Z940 Kidney transplant status: Principal | ICD-10-CM

## 2023-05-14 DIAGNOSIS — B20 Human immunodeficiency virus [HIV] disease: Principal | ICD-10-CM

## 2023-05-14 DIAGNOSIS — I158 Other secondary hypertension: Principal | ICD-10-CM

## 2023-05-14 DIAGNOSIS — Z21 Asymptomatic human immunodeficiency virus [HIV] infection status: Principal | ICD-10-CM

## 2023-05-14 DIAGNOSIS — Z949 Transplanted organ and tissue status, unspecified: Principal | ICD-10-CM

## 2023-05-14 MED ORDER — CALCITRIOL 0.5 MCG CAPSULE
ORAL_CAPSULE | Freq: Two times a day (BID) | ORAL | 11 refills | 30.00 days | Status: CP
Start: 2023-05-14 — End: 2024-05-13
  Filled 2023-06-11: qty 240, 30d supply, fill #0

## 2023-05-14 MED ORDER — PREDNISONE 5 MG TABLET
ORAL_TABLET | Freq: Every day | ORAL | 3 refills | 90.00 days | Status: CP
Start: 2023-05-14 — End: 2024-05-13
  Filled 2023-06-22: qty 90, 90d supply, fill #0

## 2023-05-14 MED ORDER — BICTEGRAVIR 50 MG-EMTRICITABINE 200 MG-TENOFOVIR ALAFENAM 25 MG TABLET
ORAL_TABLET | Freq: Every morning | ORAL | 11 refills | 30.00 days | Status: CP
Start: 2023-05-14 — End: ?
  Filled 2023-06-11: qty 30, 30d supply, fill #0

## 2023-05-14 MED ORDER — CARVEDILOL 12.5 MG TABLET
ORAL_TABLET | Freq: Two times a day (BID) | ORAL | 3 refills | 90.00 days | Status: CP
Start: 2023-05-14 — End: 2023-06-13

## 2023-05-14 MED ORDER — TACROLIMUS XR 1 MG TABLET,EXTENDED RELEASE 24 HR
ORAL_TABLET | Freq: Two times a day (BID) | ORAL | 3 refills | 90.00 days | Status: CP
Start: 2023-05-14 — End: ?

## 2023-05-14 MED ORDER — MINOXIDIL 2.5 MG TABLET
ORAL_TABLET | Freq: Every day | ORAL | 3 refills | 90.00 days | Status: CP
Start: 2023-05-14 — End: 2024-05-13
  Filled 2023-07-13: qty 90, 90d supply, fill #0

## 2023-05-14 MED ORDER — ERGOCALCIFEROL (VITAMIN D2) 1,250 MCG (50,000 UNIT) CAPSULE
ORAL_CAPSULE | ORAL | 1 refills | 84.00 days | Status: CP
Start: 2023-05-14 — End: 2024-05-13
  Filled 2023-06-11: qty 12, 84d supply, fill #0

## 2023-05-14 MED ORDER — AZATHIOPRINE 100 MG TABLET
ORAL_TABLET | Freq: Every day | ORAL | 3 refills | 90.00 days | Status: CP
Start: 2023-05-14 — End: 2024-05-13
  Filled 2023-06-11: qty 30, 30d supply, fill #0

## 2023-05-14 MED ORDER — TACROLIMUS XR 4 MG TABLET,EXTENDED RELEASE 24 HR
ORAL_TABLET | Freq: Two times a day (BID) | ORAL | 3 refills | 90.00 days | Status: CP
Start: 2023-05-14 — End: 2024-05-13
  Filled 2023-05-17: qty 180, 30d supply, fill #0

## 2023-05-14 MED ORDER — PANTOPRAZOLE 40 MG TABLET,DELAYED RELEASE
ORAL_TABLET | Freq: Every day | ORAL | 1 refills | 90.00 days | Status: CP | PRN
Start: 2023-05-14 — End: ?

## 2023-05-14 NOTE — Unmapped (Signed)
 Reviewed recent labs and tac level with Cecilie Lowers PharmD, pt advised to increase envarsus dose to 15 mg bid.  Pt will need 1 mg tablets- scripts sent to [facility] to schedule delivery.  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

## 2023-05-15 NOTE — Unmapped (Signed)
 [facility] Specialty and Home Delivery Pharmacy Refill Coordination Note    Specialty Medication(s) to be Shipped:   Transplant: Envarsus XR 1mg     Other medication(s) to be shipped: No additional medications requested for fill at this time     Christian Brown, DOB: 06-02-1980  Phone: [phone] (home)       All above HIPAA information was verified with patient.     Was a Nurse, learning disability used for this call? No    Completed refill call assessment today to schedule patient's medication shipment from the [facility] and Home Delivery Pharmacy  [phone]).  All relevant notes have been reviewed.     Specialty medication(s) and dose(s) confirmed: Regimen is correct and unchanged.   Changes to medications: Zykeem reports no changes at this time.  Changes to insurance: No  New side effects reported not previously addressed with a pharmacist or physician: None reported  Questions for the pharmacist: No    Confirmed patient received a Conservation officer, historic buildings and a Surveyor, mining with first shipment. The patient will receive a drug information handout for each medication shipped and additional FDA Medication Guides as required.       DISEASE/MEDICATION-SPECIFIC INFORMATION        N/A    SPECIALTY MEDICATION ADHERENCE     Medication Adherence    Patient reported X missed doses in the last month: 0  Specialty Medication: tacrolimus 1 mg Tb24 extended release tablet (ENVARSUS XR)  Patient is on additional specialty medications: No  Patient is on more than two specialty medications: No  Any gaps in refill history greater than 2 weeks in the last 3 months: no  Demonstrates understanding of importance of adherence: yes              Were doses missed due to medication being on hold? No    tacrolimus 1  mg: 0 days of medicine on hand       REFERRAL TO PHARMACIST     Referral to the pharmacist: Not needed      [facility]     Shipping address confirmed in Epic.     Cost and Payment: Patient has a $0 copay, payment information is not required.    Delivery Scheduled: Yes, Expected medication delivery date: 05/18/23.     Medication will be delivered via UPS to the prescription address in Epic WAM.    Christian Brown   [facility] Specialty and Home Delivery Pharmacy  Specialty Technician

## 2023-05-15 NOTE — Unmapped (Addendum)
 [facility] Pharmacist has reviewed a new prescription for envarsus that indicates a dose increase.  Patient was counseled on this dosage change by CC- see epic note from 05/14/23.  Next refill call date adjusted if necessary.        Clinical Assessment Needed For: Dose Change  Medication: Envarsus XR 1mg  tablet  Last Fill Date/Day Supply: N/A / N/A  Copay $0  Was previous dose already scheduled to fill: No    Notes to Pharmacist: N/A      Clinical Assessment Needed For: Dose Change  Medication: Envarsus XR 4mg  tablet  Last Fill Date/Day Supply: 04/17/2023 / 30 days  Copay $0  Was previous dose already scheduled to fill: No    Notes to Pharmacist: N/A

## 2023-05-16 LAB — URINALYSIS WITH MICROSCOPY
BILIRUBIN UA: NEGATIVE
BLOOD UA: NEGATIVE
GLUCOSE UA: NEGATIVE
KETONES UA: NEGATIVE
NITRITE UA: NEGATIVE
PH UA: 6 (ref 5.0–7.5)
SPECIFIC GRAVITY UA: 1.025 (ref 1.005–1.030)
UROBILINOGEN UA: 0.2 mg/dL (ref 0.2–1.0)

## 2023-05-16 LAB — MICROSCOPIC EXAMINATION
BACTERIA: NONE SEEN
CASTS: NONE SEEN /LPF
EPITHELIAL CELLS (NON RENAL): NONE SEEN /HPF (ref 0–10)
RBC URINE: NONE SEEN /HPF (ref 0–2)

## 2023-05-16 LAB — CBC W/ DIFFERENTIAL
BANDED NEUTROPHILS ABSOLUTE COUNT: 0 10*3/uL (ref 0.0–0.1)
BASOPHILS ABSOLUTE COUNT: 0 10*3/uL (ref 0.0–0.2)
BASOPHILS RELATIVE PERCENT: 1 %
EOSINOPHILS ABSOLUTE COUNT: 0.1 10*3/uL (ref 0.0–0.4)
EOSINOPHILS RELATIVE PERCENT: 3 %
HEMATOCRIT: 44.6 % (ref 37.5–51.0)
HEMOGLOBIN: 14.5 g/dL (ref 13.0–17.7)
IMMATURE GRANULOCYTES: 1 %
LYMPHOCYTES ABSOLUTE COUNT: 0.9 10*3/uL (ref 0.7–3.1)
LYMPHOCYTES RELATIVE PERCENT: 20 %
MEAN CORPUSCULAR HEMOGLOBIN CONC: 32.5 g/dL (ref 31.5–35.7)
MEAN CORPUSCULAR HEMOGLOBIN: 32.8 pg (ref 26.6–33.0)
MEAN CORPUSCULAR VOLUME: 101 fL — ABNORMAL HIGH (ref 79–97)
MONOCYTES ABSOLUTE COUNT: 0.4 10*3/uL (ref 0.1–0.9)
MONOCYTES RELATIVE PERCENT: 10 %
NEUTROPHILS ABSOLUTE COUNT: 2.9 10*3/uL (ref 1.4–7.0)
NEUTROPHILS RELATIVE PERCENT: 65 %
PLATELET COUNT: 188 10*3/uL (ref 150–450)
RED BLOOD CELL COUNT: 4.42 x10E6/uL (ref 4.14–5.80)
RED CELL DISTRIBUTION WIDTH: 13.4 % (ref 11.6–15.4)
WHITE BLOOD CELL COUNT: 4.3 10*3/uL (ref 3.4–10.8)

## 2023-05-16 LAB — RENAL FUNCTION PANEL
ALBUMIN: 4.3 g/dL (ref 4.1–5.1)
BLOOD UREA NITROGEN: 23 mg/dL (ref 6–24)
BUN / CREAT RATIO: 13 (ref 9–20)
CALCIUM: 8.6 mg/dL — ABNORMAL LOW (ref 8.7–10.2)
CHLORIDE: 105 mmol/L (ref 96–106)
CO2: 22 mmol/L (ref 20–29)
CREATININE: 1.77 mg/dL — ABNORMAL HIGH (ref 0.76–1.27)
EGFR: 49 mL/min/{1.73_m2} — ABNORMAL LOW
GLUCOSE: 81 mg/dL (ref 70–99)
PHOSPHORUS, SERUM: 4.4 mg/dL — ABNORMAL HIGH (ref 2.8–4.1)
POTASSIUM: 4.4 mmol/L (ref 3.5–5.2)
SODIUM: 140 mmol/L (ref 134–144)

## 2023-05-16 LAB — MAGNESIUM: MAGNESIUM: 1.7 mg/dL (ref 1.6–2.3)

## 2023-05-17 LAB — TACROLIMUS LEVEL: TACROLIMUS BLOOD: 8.1 ng/mL (ref 5.0–20.0)

## 2023-05-21 DIAGNOSIS — Z79899 Other long term (current) drug therapy: Principal | ICD-10-CM

## 2023-05-21 DIAGNOSIS — B20 Human immunodeficiency virus [HIV] disease: Principal | ICD-10-CM

## 2023-05-21 DIAGNOSIS — Z21 Asymptomatic human immunodeficiency virus [HIV] infection status: Principal | ICD-10-CM

## 2023-05-21 DIAGNOSIS — Z940 Kidney transplant status: Principal | ICD-10-CM

## 2023-05-23 LAB — CBC W/ DIFFERENTIAL
BANDED NEUTROPHILS ABSOLUTE COUNT: 0 10*3/uL (ref 0.0–0.1)
BASOPHILS ABSOLUTE COUNT: 0 10*3/uL (ref 0.0–0.2)
BASOPHILS RELATIVE PERCENT: 0 %
EOSINOPHILS ABSOLUTE COUNT: 0.1 10*3/uL (ref 0.0–0.4)
EOSINOPHILS RELATIVE PERCENT: 2 %
HEMATOCRIT: 42.9 % (ref 37.5–51.0)
HEMOGLOBIN: 13.9 g/dL (ref 13.0–17.7)
IMMATURE GRANULOCYTES: 0 %
LYMPHOCYTES ABSOLUTE COUNT: 0.6 10*3/uL — ABNORMAL LOW (ref 0.7–3.1)
LYMPHOCYTES RELATIVE PERCENT: 13 %
MEAN CORPUSCULAR HEMOGLOBIN CONC: 32.4 g/dL (ref 31.5–35.7)
MEAN CORPUSCULAR HEMOGLOBIN: 32 pg (ref 26.6–33.0)
MEAN CORPUSCULAR VOLUME: 99 fL — ABNORMAL HIGH (ref 79–97)
MONOCYTES ABSOLUTE COUNT: 0.4 10*3/uL (ref 0.1–0.9)
MONOCYTES RELATIVE PERCENT: 9 %
NEUTROPHILS ABSOLUTE COUNT: 3.6 10*3/uL (ref 1.4–7.0)
NEUTROPHILS RELATIVE PERCENT: 76 %
PLATELET COUNT: 177 10*3/uL (ref 150–450)
RED BLOOD CELL COUNT: 4.34 x10E6/uL (ref 4.14–5.80)
RED CELL DISTRIBUTION WIDTH: 13.5 % (ref 11.6–15.4)
WHITE BLOOD CELL COUNT: 4.8 10*3/uL (ref 3.4–10.8)

## 2023-05-23 NOTE — Unmapped (Signed)
 [facility] Specialty and Home Delivery Pharmacy Refill Coordination Note    Specialty Medication(s) to be Shipped:   Transplant: Envarsus XR 4mg     Other medication(s) to be shipped: No additional medications requested for fill at this time     Christian Brown, DOB: [birth year]  Phone: [phone] (home)       All above HIPAA information was verified with patient.     Was a Nurse, learning disability used for this call? No    Completed refill call assessment today to schedule patient's medication shipment from the [facility] and Home Delivery Pharmacy  [phone]).  All relevant notes have been reviewed.     Specialty medication(s) and dose(s) confirmed: Regimen is correct and unchanged.   Changes to medications: Areeb reports no changes at this time.  Changes to insurance: No  New side effects reported not previously addressed with a pharmacist or physician: None reported  Questions for the pharmacist: No    Confirmed patient received a Conservation officer, historic buildings and a Surveyor, mining with first shipment. The patient will receive a drug information handout for each medication shipped and additional FDA Medication Guides as required.       DISEASE/MEDICATION-SPECIFIC INFORMATION        N/A    SPECIALTY MEDICATION ADHERENCE     Medication Adherence    Patient reported X missed doses in the last month: 0  Specialty Medication: ENVARSUS XR 4 mg Tb24 extended release tablet (tacrolimus)  Patient is on additional specialty medications: No  Patient is on more than two specialty medications: No  Any gaps in refill history greater than 2 weeks in the last 3 months: no  Demonstrates understanding of importance of adherence: yes              Were doses missed due to medication being on hold? No    ENVARSUS XR 4  mg: 3 days of medicine on hand       REFERRAL TO PHARMACIST     Referral to the pharmacist: Not needed      [facility]     Shipping address confirmed in Epic.     Cost and Payment: Patient has a $0 copay, payment information is not required.    Delivery Scheduled: Yes, Expected medication delivery date: 05/25/23.     Medication will be delivered via UPS to the prescription address in Epic WAM.    Moshe Salisbury   [facility] Specialty and Home Delivery Pharmacy  Specialty Technician

## 2023-05-24 LAB — RENAL FUNCTION PANEL
ALBUMIN: 4.1 g/dL (ref 4.1–5.1)
BLOOD UREA NITROGEN: 19 mg/dL (ref 6–24)
BUN / CREAT RATIO: 10 (ref 9–20)
CALCIUM: 8.8 mg/dL (ref 8.7–10.2)
CHLORIDE: 106 mmol/L (ref 96–106)
CO2: 19 mmol/L — ABNORMAL LOW (ref 20–29)
CREATININE: 1.86 mg/dL — ABNORMAL HIGH (ref 0.76–1.27)
EGFR: 46 mL/min/{1.73_m2} — ABNORMAL LOW
GLUCOSE: 93 mg/dL (ref 70–99)
PHOSPHORUS, SERUM: 3.7 mg/dL (ref 2.8–4.1)
POTASSIUM: 4.7 mmol/L (ref 3.5–5.2)
SODIUM: 139 mmol/L (ref 134–144)

## 2023-05-24 LAB — URINALYSIS WITH MICROSCOPY
BILIRUBIN UA: NEGATIVE
BLOOD UA: NEGATIVE
GLUCOSE UA: NEGATIVE
KETONES UA: NEGATIVE
LEUKOCYTE ESTERASE UA: NEGATIVE
NITRITE UA: NEGATIVE
PH UA: 6.5 (ref 5.0–7.5)
SPECIFIC GRAVITY UA: 1.023 (ref 1.005–1.030)
UROBILINOGEN UA: 0.2 mg/dL (ref 0.2–1.0)

## 2023-05-24 LAB — MICROSCOPIC EXAMINATION
BACTERIA: NONE SEEN
CASTS: NONE SEEN /LPF
EPITHELIAL CELLS (NON RENAL): NONE SEEN /HPF (ref 0–10)
RBC URINE: NONE SEEN /HPF (ref 0–2)

## 2023-05-24 LAB — TACROLIMUS LEVEL: TACROLIMUS BLOOD: 0.8 ng/mL — ABNORMAL LOW (ref 5.0–20.0)

## 2023-05-24 LAB — MAGNESIUM: MAGNESIUM: 1.7 mg/dL (ref 1.6–2.3)

## 2023-05-24 MED FILL — ENVARSUS XR 4 MG TABLET,EXTENDED RELEASE: ORAL | 30 days supply | Qty: 180 | Fill #0

## 2023-05-25 DIAGNOSIS — Z940 Kidney transplant status: Principal | ICD-10-CM

## 2023-05-25 DIAGNOSIS — B20 Human immunodeficiency virus [HIV] disease: Principal | ICD-10-CM

## 2023-05-25 DIAGNOSIS — Z21 Asymptomatic human immunodeficiency virus [HIV] infection status: Principal | ICD-10-CM

## 2023-05-25 NOTE — Unmapped (Signed)
 Called pt and sent Mychart message to see if he missed any doses of tac due to level being 0.8. No answer, VM left for pt to call back.

## 2023-05-28 DIAGNOSIS — Z21 Asymptomatic human immunodeficiency virus [HIV] infection status: Principal | ICD-10-CM

## 2023-05-28 DIAGNOSIS — Z79899 Other long term (current) drug therapy: Principal | ICD-10-CM

## 2023-05-28 DIAGNOSIS — Z940 Kidney transplant status: Principal | ICD-10-CM

## 2023-05-28 DIAGNOSIS — B20 Human immunodeficiency virus [HIV] disease: Principal | ICD-10-CM

## 2023-05-28 LAB — CBC W/ DIFFERENTIAL
BANDED NEUTROPHILS ABSOLUTE COUNT: 0 10*3/uL (ref 0.0–0.1)
BASOPHILS ABSOLUTE COUNT: 0 10*3/uL (ref 0.0–0.2)
BASOPHILS RELATIVE PERCENT: 0 %
EOSINOPHILS ABSOLUTE COUNT: 0.1 10*3/uL (ref 0.0–0.4)
EOSINOPHILS RELATIVE PERCENT: 2 %
HEMATOCRIT: 37.8 % (ref 37.5–51.0)
HEMOGLOBIN: 13 g/dL (ref 13.0–17.7)
IMMATURE GRANULOCYTES: 0 %
LYMPHOCYTES ABSOLUTE COUNT: 0.6 10*3/uL — ABNORMAL LOW (ref 0.7–3.1)
LYMPHOCYTES RELATIVE PERCENT: 13 %
MEAN CORPUSCULAR HEMOGLOBIN CONC: 34.4 g/dL (ref 31.5–35.7)
MEAN CORPUSCULAR HEMOGLOBIN: 32.7 pg (ref 26.6–33.0)
MEAN CORPUSCULAR VOLUME: 95 fL (ref 79–97)
MONOCYTES ABSOLUTE COUNT: 0.5 10*3/uL (ref 0.1–0.9)
MONOCYTES RELATIVE PERCENT: 12 %
NEUTROPHILS ABSOLUTE COUNT: 3.4 10*3/uL (ref 1.4–7.0)
NEUTROPHILS RELATIVE PERCENT: 73 %
PLATELET COUNT: 157 10*3/uL (ref 150–450)
RED BLOOD CELL COUNT: 3.97 x10E6/uL — ABNORMAL LOW (ref 4.14–5.80)
RED CELL DISTRIBUTION WIDTH: 12.6 % (ref 11.6–15.4)
WHITE BLOOD CELL COUNT: 4.6 10*3/uL (ref 3.4–10.8)

## 2023-05-28 LAB — URINALYSIS WITH MICROSCOPY
BILIRUBIN UA: NEGATIVE
BLOOD UA: NEGATIVE
GLUCOSE UA: NEGATIVE
KETONES UA: NEGATIVE
LEUKOCYTE ESTERASE UA: NEGATIVE
NITRITE UA: NEGATIVE
PH UA: 6.5 (ref 5.0–7.5)
SPECIFIC GRAVITY UA: 1.017 (ref 1.005–1.030)
UROBILINOGEN UA: 0.2 mg/dL (ref 0.2–1.0)

## 2023-05-28 LAB — MICROSCOPIC EXAMINATION
BACTERIA: NONE SEEN
CASTS: NONE SEEN /LPF
EPITHELIAL CELLS (NON RENAL): NONE SEEN /HPF (ref 0–10)
RBC URINE: NONE SEEN /HPF (ref 0–2)

## 2023-05-28 NOTE — Unmapped (Signed)
 Called to follow up on recent tac level 0.8 - pt reports no missed doses but does say he went to a different lab corp that his usual.  He got labs today and will be a good level.  Reports taking envarsus 15 mg bid.

## 2023-05-29 LAB — MAGNESIUM: MAGNESIUM: 1.6 mg/dL (ref 1.6–2.3)

## 2023-05-29 LAB — RENAL FUNCTION PANEL
ALBUMIN: 3.8 g/dL — ABNORMAL LOW (ref 4.1–5.1)
BLOOD UREA NITROGEN: 18 mg/dL (ref 6–24)
BUN / CREAT RATIO: 9 (ref 9–20)
CALCIUM: 9.1 mg/dL (ref 8.7–10.2)
CHLORIDE: 104 mmol/L (ref 96–106)
CO2: 19 mmol/L — ABNORMAL LOW (ref 20–29)
CREATININE: 1.93 mg/dL — ABNORMAL HIGH (ref 0.76–1.27)
EGFR: 44 mL/min/{1.73_m2} — ABNORMAL LOW
GLUCOSE: 86 mg/dL (ref 70–99)
PHOSPHORUS, SERUM: 3.4 mg/dL (ref 2.8–4.1)
POTASSIUM: 4.6 mmol/L (ref 3.5–5.2)
SODIUM: 138 mmol/L (ref 134–144)

## 2023-05-29 LAB — TACROLIMUS LEVEL: TACROLIMUS BLOOD: 6.5 ng/mL (ref 5.0–20.0)

## 2023-06-04 DIAGNOSIS — Z940 Kidney transplant status: Principal | ICD-10-CM

## 2023-06-04 DIAGNOSIS — Z796 Long term current use of immunosuppressive drug: Principal | ICD-10-CM

## 2023-06-04 DIAGNOSIS — Z21 Asymptomatic human immunodeficiency virus [HIV] infection status: Principal | ICD-10-CM

## 2023-06-04 DIAGNOSIS — B20 Human immunodeficiency virus [HIV] disease: Principal | ICD-10-CM

## 2023-06-04 LAB — URINALYSIS WITH MICROSCOPY
BILIRUBIN UA: NEGATIVE
BLOOD UA: NEGATIVE
GLUCOSE UA: NEGATIVE
KETONES UA: NEGATIVE
NITRITE UA: NEGATIVE
PH UA: 6 (ref 5.0–7.5)
SPECIFIC GRAVITY UA: 1.017 (ref 1.005–1.030)
UROBILINOGEN UA: 0.2 mg/dL (ref 0.2–1.0)

## 2023-06-04 LAB — CBC W/ DIFFERENTIAL
BANDED NEUTROPHILS ABSOLUTE COUNT: 0 10*3/uL (ref 0.0–0.1)
BASOPHILS ABSOLUTE COUNT: 0 10*3/uL (ref 0.0–0.2)
BASOPHILS RELATIVE PERCENT: 1 %
EOSINOPHILS ABSOLUTE COUNT: 0.1 10*3/uL (ref 0.0–0.4)
EOSINOPHILS RELATIVE PERCENT: 2 %
HEMATOCRIT: 38.7 % (ref 37.5–51.0)
HEMOGLOBIN: 12.7 g/dL — ABNORMAL LOW (ref 13.0–17.7)
IMMATURE GRANULOCYTES: 1 %
LYMPHOCYTES ABSOLUTE COUNT: 0.6 10*3/uL — ABNORMAL LOW (ref 0.7–3.1)
LYMPHOCYTES RELATIVE PERCENT: 18 %
MEAN CORPUSCULAR HEMOGLOBIN CONC: 32.8 g/dL (ref 31.5–35.7)
MEAN CORPUSCULAR HEMOGLOBIN: 31.3 pg (ref 26.6–33.0)
MEAN CORPUSCULAR VOLUME: 95 fL (ref 79–97)
MONOCYTES ABSOLUTE COUNT: 0.5 10*3/uL (ref 0.1–0.9)
MONOCYTES RELATIVE PERCENT: 15 %
NEUTROPHILS ABSOLUTE COUNT: 2.2 10*3/uL (ref 1.4–7.0)
NEUTROPHILS RELATIVE PERCENT: 63 %
PLATELET COUNT: 247 10*3/uL (ref 150–450)
RED BLOOD CELL COUNT: 4.06 x10E6/uL — ABNORMAL LOW (ref 4.14–5.80)
RED CELL DISTRIBUTION WIDTH: 13.2 % (ref 11.6–15.4)
WHITE BLOOD CELL COUNT: 3.5 10*3/uL (ref 3.4–10.8)

## 2023-06-04 LAB — MICROSCOPIC EXAMINATION
BACTERIA: NONE SEEN
CASTS: NONE SEEN /LPF
EPITHELIAL CELLS (NON RENAL): NONE SEEN /HPF (ref 0–10)
RBC URINE: NONE SEEN /HPF (ref 0–2)

## 2023-06-05 LAB — RENAL FUNCTION PANEL
ALBUMIN: 3.8 g/dL — ABNORMAL LOW (ref 4.1–5.1)
BLOOD UREA NITROGEN: 28 mg/dL — ABNORMAL HIGH (ref 6–24)
BUN / CREAT RATIO: 12 (ref 9–20)
CALCIUM: 9.8 mg/dL (ref 8.7–10.2)
CHLORIDE: 107 mmol/L — ABNORMAL HIGH (ref 96–106)
CO2: 18 mmol/L — ABNORMAL LOW (ref 20–29)
CREATININE: 2.3 mg/dL — ABNORMAL HIGH (ref 0.76–1.27)
EGFR: 36 mL/min/{1.73_m2} — ABNORMAL LOW
GLUCOSE: 79 mg/dL (ref 70–99)
PHOSPHORUS, SERUM: 3.4 mg/dL (ref 2.8–4.1)
POTASSIUM: 5 mmol/L (ref 3.5–5.2)
SODIUM: 139 mmol/L (ref 134–144)

## 2023-06-05 LAB — TACROLIMUS LEVEL: TACROLIMUS BLOOD: 7 ng/mL (ref 5.0–20.0)

## 2023-06-05 LAB — MAGNESIUM: MAGNESIUM: 1.5 mg/dL — ABNORMAL LOW (ref 1.6–2.3)

## 2023-06-08 NOTE — Unmapped (Signed)
 [facility] Specialty and Home Delivery Pharmacy Refill Coordination Note    Specialty Medication(s) to be Shipped:   Infectious Disease: Biktarvy     Other medication(s) to be shipped:  azathioprine , calcitriol , DRISDOL , ventolin  HFA inhaler     Christian Brown, DOB: 12-23-1980  Phone: [phone] (home)       All above HIPAA information was verified with patient.     Was a Nurse, learning disability used for this call? No    Completed refill call assessment today to schedule patient's medication shipment from the [facility] and Home Delivery Pharmacy  [phone]).  All relevant notes have been reviewed.     Specialty medication(s) and dose(s) confirmed: Regimen is correct and unchanged.   Changes to medications: Christian Brown reports no changes at this time.  Changes to insurance: No  New side effects reported not previously addressed with a pharmacist or physician: None reported  Questions for the pharmacist: No    Confirmed patient received a Conservation officer, historic buildings and a Surveyor, mining with first shipment. The patient will receive a drug information handout for each medication shipped and additional FDA Medication Guides as required.       DISEASE/MEDICATION-SPECIFIC INFORMATION        N/A    SPECIALTY MEDICATION ADHERENCE     Medication Adherence    Patient reported X missed doses in the last month: 0  Specialty Medication: bictegrav-emtricit-tenofov ala (BIKTARVY ) 50-200-25 mg tablet  Patient is on additional specialty medications: No  Informant: patient              Were doses missed due to medication being on hold? No    bictegrav-emtricit-tenofov ala (BIKTARVY ) 50-200-25 mg tablet : 10 days of medicine on hand       REFERRAL TO PHARMACIST     Referral to the pharmacist: Not needed      [facility]     Shipping address confirmed in Epic.     Cost and Payment: Patient has a copay of $4. They are aware and have authorized the pharmacy to charge the credit card on file.    Delivery Scheduled: Yes, Expected medication delivery date: 4/15.     Medication will be delivered via UPS to the prescription address in Epic WAM.    Christian Brown Specialty and [facility]

## 2023-06-11 DIAGNOSIS — Z940 Kidney transplant status: Principal | ICD-10-CM

## 2023-06-11 DIAGNOSIS — B20 Human immunodeficiency virus [HIV] disease: Principal | ICD-10-CM

## 2023-06-11 DIAGNOSIS — Z796 Long term current use of immunosuppressive drug: Principal | ICD-10-CM

## 2023-06-11 DIAGNOSIS — Z21 Asymptomatic human immunodeficiency virus [HIV] infection status: Principal | ICD-10-CM

## 2023-06-11 LAB — CBC W/ DIFFERENTIAL
BANDED NEUTROPHILS ABSOLUTE COUNT: 0 10*3/uL (ref 0.0–0.1)
BASOPHILS ABSOLUTE COUNT: 0 10*3/uL (ref 0.0–0.2)
BASOPHILS RELATIVE PERCENT: 1 %
EOSINOPHILS ABSOLUTE COUNT: 0.1 10*3/uL (ref 0.0–0.4)
EOSINOPHILS RELATIVE PERCENT: 4 %
HEMATOCRIT: 41.8 % (ref 37.5–51.0)
HEMOGLOBIN: 13.6 g/dL (ref 13.0–17.7)
IMMATURE GRANULOCYTES: 1 %
LYMPHOCYTES ABSOLUTE COUNT: 0.6 10*3/uL — ABNORMAL LOW (ref 0.7–3.1)
LYMPHOCYTES RELATIVE PERCENT: 19 %
MEAN CORPUSCULAR HEMOGLOBIN CONC: 32.5 g/dL (ref 31.5–35.7)
MEAN CORPUSCULAR HEMOGLOBIN: 31.6 pg (ref 26.6–33.0)
MEAN CORPUSCULAR VOLUME: 97 fL (ref 79–97)
MONOCYTES ABSOLUTE COUNT: 0.4 10*3/uL (ref 0.1–0.9)
MONOCYTES RELATIVE PERCENT: 13 %
NEUTROPHILS ABSOLUTE COUNT: 2 10*3/uL (ref 1.4–7.0)
NEUTROPHILS RELATIVE PERCENT: 62 %
PLATELET COUNT: 288 10*3/uL (ref 150–450)
RED BLOOD CELL COUNT: 4.31 x10E6/uL (ref 4.14–5.80)
RED CELL DISTRIBUTION WIDTH: 13.2 % (ref 11.6–15.4)
WHITE BLOOD CELL COUNT: 3.2 10*3/uL — ABNORMAL LOW (ref 3.4–10.8)

## 2023-06-11 MED FILL — VENTOLIN HFA 90 MCG/ACTUATION AEROSOL INHALER: RESPIRATORY_TRACT | 17 days supply | Qty: 18 | Fill #6

## 2023-06-12 LAB — RENAL FUNCTION PANEL
ALBUMIN: 3.7 g/dL — ABNORMAL LOW (ref 4.1–5.1)
BLOOD UREA NITROGEN: 20 mg/dL (ref 6–24)
BUN / CREAT RATIO: 10 (ref 9–20)
CALCIUM: 9.5 mg/dL (ref 8.7–10.2)
CHLORIDE: 107 mmol/L — ABNORMAL HIGH (ref 96–106)
CO2: 19 mmol/L — ABNORMAL LOW (ref 20–29)
CREATININE: 1.98 mg/dL — ABNORMAL HIGH (ref 0.76–1.27)
EGFR: 43 mL/min/{1.73_m2} — ABNORMAL LOW
GLUCOSE: 127 mg/dL — ABNORMAL HIGH (ref 70–99)
PHOSPHORUS, SERUM: 3.7 mg/dL (ref 2.8–4.1)
POTASSIUM: 4.7 mmol/L (ref 3.5–5.2)
SODIUM: 141 mmol/L (ref 134–144)

## 2023-06-12 LAB — URINALYSIS WITH MICROSCOPY
BILIRUBIN UA: NEGATIVE
BLOOD UA: NEGATIVE
GLUCOSE UA: NEGATIVE
KETONES UA: NEGATIVE
NITRITE UA: NEGATIVE
PH UA: 5.5 (ref 5.0–7.5)
SPECIFIC GRAVITY UA: 1.019 (ref 1.005–1.030)
UROBILINOGEN UA: 0.2 mg/dL (ref 0.2–1.0)

## 2023-06-12 LAB — TACROLIMUS LEVEL: TACROLIMUS BLOOD: 5.2 ng/mL (ref 5.0–20.0)

## 2023-06-12 LAB — MICROSCOPIC EXAMINATION
BACTERIA: NONE SEEN
CASTS: NONE SEEN /LPF
EPITHELIAL CELLS (NON RENAL): NONE SEEN /HPF (ref 0–10)

## 2023-06-12 LAB — MAGNESIUM: MAGNESIUM: 1.7 mg/dL (ref 1.6–2.3)

## 2023-06-17 NOTE — Progress Notes (Signed)
 Chief complaint: follow-up for HIV disease on medications  Subjective:    Patient ID: Jim Wyatt, male    DOB: Nov 22, 1980, 43 y.o.   MRN: [id number]  HPI   Discussed the use of AI scribe software for clinical note transcription with the patient, who gave verbal consent to proceed.  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 He  has been followed by [facility] since he transplant in July of the previous year. He reports that his creatinine levels have been fluctuating, with a recent reading of 1.98, which he notes is not bad. He mentions that his creatinine levels had spiked when he had pneumonia at the end of December, but have been steadily trending down since then. He also mentions that he had blood work done earlier in the day, which included a CBC, differential, magnesium , urinalysis, urine culture, renal function, and tacrolimus.  The patient is also on Biktarvy  for his HIV, which he reports no problems with. He is considering relocating to [place]  for work, but plans to maintain his residence in [place]. He expresses willingness to continue his current regimen and follow-up schedule, despite the potential move.       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  Went on HD Dec 2016.    Fever    Genital warts 03/08/2018   Headache    HIV infection (HCC) dx'd 10/2013   Hyperlipidemia 05/25/2022   Hypertension    Leg pain    Multiple environmental allergies    "trees, dogs, cats"   Multiple food allergies    Need for prophylactic vaccination against Streptococcus pneumoniae (pneumococcus)  08/28/2017   Seizures (HCC)    last seizure per pt in Jan   Shingles < 2010   Vaccine counseling 02/01/2021    Past Surgical History:  Procedure Laterality Date   AV FISTULA PLACEMENT Left 03/03/2015   Procedure: Creation of Left Arm RADIOCEPHALIC ARTERIOVENOUS FISTULA ;  Surgeon: Palma Bob, MD;  Location: [facility] OR;  Service: Vascular;  Laterality: Left;   COLONOSCOPY N/A 03/18/2016   Procedure: COLONOSCOPY;  Surgeon: Danette Duos, MD;  Location: [facility] ENDOSCOPY;  Service: Gastroenterology;  Laterality: N/A;   FEMUR FRACTURE SURGERY Right 09/2001   FRACTURE SURGERY     I & D EXTREMITY Right 12/18/2014   Procedure: IRRIGATION AND DEBRIDEMENT RIGHT LOWER EXTREMITY;  Surgeon: Shela Derby, MD;  Location: [facility] OR;  Service: General;  Laterality: Right;   INSERTION OF DIALYSIS CATHETER  2016   LAPAROTOMY N/A 12/28/2015   Procedure: EXPLORATORY LAPAROTOMY BOWEL RESECTION ILEOCECECTOMY;  Surgeon: Adalberto Acton, MD;  Location: [facility] OR;  Service: General;  Laterality: N/A;    Family History  Problem Relation Age of Onset   Hypertension Mother    Diabetes Maternal Grandmother    Kidney disease Maternal Grandmother       Social History   Socioeconomic History   Marital status: Single    Spouse name: Not on file   Number of children: Not on file   Years of education: Not on file   Highest education  level: Not on file  Occupational History   Not on file  Tobacco Use   Smoking status: Never   Smokeless tobacco: Never  Substance and Sexual Activity   Alcohol  use: No    Comment: I stopped drinking along time ago "   Drug use: No   Sexual activity: Not Currently    Partners: Female    Birth control/protection: Condom    Comment: declined condoms 06/2020  Other Topics Concern   Not on file  Social History Narrative   Not on file   Social Drivers of Health   Financial Resource Strain: Low Risk  (09/06/2022)   Received from [facility]   Overall Financial Resource  Strain (CARDIA)    Difficulty of Paying Living Expenses: Not hard at all  Food Insecurity: No Food Insecurity (09/06/2022)   Received from [facility]   Hunger Vital Sign    Worried About Running Out of Food in the Last Year: Never true    Ran Out of Food in the Last Year: Never true  Transportation Needs: No Transportation Needs (09/06/2022)   Received from [facility]   PRAPARE - Transportation    Lack of Transportation (Medical): No    Lack of Transportation (Non-Medical): No  Physical Activity: Not on file  Stress: Not on file  Social Connections: Unknown (07/07/2021)   Received from [facility]   Social Network    Social Network: Not on file    Allergies  Allergen Reactions   Bactrim  [Sulfamethoxazole -Trimethoprim ] Other (See Comments)    Renal failure   Doxercalciferol  Hives   Fish Allergy Hives and Shortness Of Breath    Throat closes up Please do not serve fish for meals. Highly allergic  Please do not serve fish for meals. Highly allergic  Please do not serve fish for meals. Highly allergic  Please do not serve fish for meals. Highly allergic     Iron Sucrose  Hives   Onion Hives and Other (See Comments)    Per allergy test   Procardia  [Nifedipine ] Hives, Shortness Of Breath and Itching   Reglan  [Metoclopramide ] Other (See Comments)    Causes ticks/ jerks   Vancomycin  Other (See Comments)    Patient states vancomycin  caused kidney injury   Metoprolol  Hives   Amlodipine  Palpitations and Other (See Comments)    Causes fatigue   Augmentin [Amoxicillin-Pot Clavulanate] Hives   Calcitriol  Hives     Current Outpatient Medications:    acetaminophen  (TYLENOL ) 500 MG tablet, Take 1,000 mg by mouth every 6 (six) hours as needed (pain)., Disp: , Rfl:    albuterol  (PROVENTIL  HFA;VENTOLIN  HFA) 108 (90 Base) MCG/ACT inhaler, Inhale 2 puffs into the lungs every 6 (six) hours as needed for wheezing or shortness of breath., Disp: 1 Inhaler, Rfl: 3    bictegravir-emtricitabine -tenofovir  AF (BIKTARVY ) 50-200-25 MG TABS tablet, Take 1 tablet by mouth daily., Disp: 30 tablet, Rfl: 11   carvedilol  (COREG ) 25 MG tablet, Take 2 tablet by mouth twice a day, Disp: , Rfl:    doxycycline  (VIBRA -TABS) 100 MG tablet, Take 1 tablet (100 mg total) by mouth 2 (two) times daily. (Patient not taking: Reported on 07/08/2020), Disp: 56 tablet, Rfl: 0   hydrALAZINE  (APRESOLINE ) 25 MG tablet, TAKE 1 TABLET BY MOUTH THREE TIMES A DAY AS DIRECTED (Patient not taking: Reported on 05/25/2022), Disp: , Rfl: 0   HYDROcodone -acetaminophen  (NORCO/VICODIN) 5-325 MG tablet, , Disp: , Rfl:    imiquimod  (ALDARA ) 5 % cream, Apply a  thin layer prior TO bedtime 3x week AND LEAVE ON FOR 6-10 HOURS. Wash off with soap AND WATER .], Disp: 24 each, Rfl: 0   ipratropium-albuterol  (DUONEB) 0.5-2.5 (3) MG/3ML SOLN, Inhale 1 vial by nebulizer every 6 hours, Disp: 180 mL, Rfl: 1   isosorbide  mononitrate (IMDUR ) 60 MG 24 hr tablet, Take 1 tablet (60 mg total) by mouth daily., Disp: 30 tablet, Rfl: 0   losartan  (COZAAR ) 100 MG tablet, Take 1 tablet (100 mg total) by mouth daily. (Patient not taking: Reported on 07/08/2020), Disp: 90 tablet, Rfl: 3   minoxidil (LONITEN) 10 MG tablet, Take by mouth., Disp: , Rfl:    valsartan (DIOVAN) 160 MG tablet, , Disp: , Rfl:     Review of Systems  Constitutional:  Negative for activity change, appetite change, chills, diaphoresis, fatigue, fever and unexpected weight change.  HENT:  Negative for congestion, rhinorrhea, sinus pressure, sneezing, sore throat and trouble swallowing.   Eyes:  Negative for photophobia and visual disturbance.  Respiratory:  Negative for cough, chest tightness, shortness of breath, wheezing and stridor.   Cardiovascular:  Negative for chest pain, palpitations and leg swelling.  Gastrointestinal:  Negative for abdominal distention, abdominal pain, anal bleeding, blood in stool, constipation, diarrhea, nausea and vomiting.   Genitourinary:  Negative for difficulty urinating, dysuria, flank pain and hematuria.  Musculoskeletal:  Negative for arthralgias, back pain, gait problem, joint swelling and myalgias.  Skin:  Negative for color change, pallor, rash and wound.  Neurological:  Negative for dizziness, tremors, weakness and light-headedness.  Hematological:  Negative for adenopathy. Does not bruise/bleed easily.  Psychiatric/Behavioral:  Negative for agitation, behavioral problems, confusion, decreased concentration, dysphoric mood and sleep disturbance.        Objective:   Physical Exam Constitutional:      Appearance: He is well-developed.  HENT:     Head: Normocephalic and atraumatic.  Eyes:     Conjunctiva/sclera: Conjunctivae normal.  Cardiovascular:     Rate and Rhythm: Normal rate and regular rhythm.  Pulmonary:     Effort: Pulmonary effort is normal. No respiratory distress.     Breath sounds: No wheezing.  Abdominal:     General: There is no distension.     Palpations: Abdomen is soft.  Musculoskeletal:        General: No tenderness. Normal range of motion.     Cervical back: Normal range of motion and neck supple.  Skin:    General: Skin is warm and dry.     Coloration: Skin is not pale.     Findings: No erythema or rash.  Neurological:     General: No focal deficit present.     Mental Status: He is alert and oriented to person, place, and time.  Psychiatric:        Mood and Affect: Mood normal.        Behavior: Behavior normal.        Thought Content: Thought content normal.        Judgment: Judgment normal.           Assessment & Plan:   Assessment and Plan    HIV infection Managed with Biktarvy . No renal impairment from tenofovir  alafenamide. Discussed increased cardiovascular risk in patients over 40 and [employer] study findings on statins. - Order HIV viral load , CD4 and STI testing.   CV prevention: now that he is 42 and HIV + he would benefit from a statin -  Consult transplant team regarding statin addition (atorvastatin or  rosuvastatin) to make sure no drug-drug interactions. I would doubt this but would want him to check to be sure.  Kidney transplant status Post-transplant with stable creatinine. Followed by [facility] for transplant care. - Continue follow-up with [facility] for transplant care. - Monitor creatinine and renal function per transplant team's guidance.  Chronic kidney disease, unspecified Stable post-transplant with no acute issues. - Monitor renal function per transplant team's guidance.

## 2023-06-18 ENCOUNTER — Encounter: Payer: Self-pay | Admitting: Infectious Disease

## 2023-06-18 ENCOUNTER — Ambulatory Visit (INDEPENDENT_AMBULATORY_CARE_PROVIDER_SITE_OTHER): Admitting: Infectious Disease

## 2023-06-18 ENCOUNTER — Other Ambulatory Visit: Payer: Self-pay

## 2023-06-18 VITALS — BP 138/91 | HR 96 | Resp 16 | Ht 67.0 in | Wt 194.0 lb

## 2023-06-18 DIAGNOSIS — Z113 Encounter for screening for infections with a predominantly sexual mode of transmission: Secondary | ICD-10-CM | POA: Diagnosis not present

## 2023-06-18 DIAGNOSIS — N186 End stage renal disease: Secondary | ICD-10-CM

## 2023-06-18 DIAGNOSIS — Z7185 Encounter for immunization safety counseling: Secondary | ICD-10-CM

## 2023-06-18 DIAGNOSIS — B20 Human immunodeficiency virus [HIV] disease: Secondary | ICD-10-CM | POA: Diagnosis present

## 2023-06-18 DIAGNOSIS — D638 Anemia in other chronic diseases classified elsewhere: Secondary | ICD-10-CM

## 2023-06-18 DIAGNOSIS — Z940 Kidney transplant status: Secondary | ICD-10-CM | POA: Diagnosis not present

## 2023-06-18 DIAGNOSIS — E785 Hyperlipidemia, unspecified: Secondary | ICD-10-CM

## 2023-06-18 DIAGNOSIS — N184 Chronic kidney disease, stage 4 (severe): Principal | ICD-10-CM

## 2023-06-18 DIAGNOSIS — Z796 Long term current use of immunosuppressive drug: Principal | ICD-10-CM

## 2023-06-18 DIAGNOSIS — E119 Type 2 diabetes mellitus without complications: Principal | ICD-10-CM

## 2023-06-18 DIAGNOSIS — Z21 Asymptomatic human immunodeficiency virus [HIV] infection status: Principal | ICD-10-CM

## 2023-06-18 LAB — CBC W/ DIFFERENTIAL
BANDED NEUTROPHILS ABSOLUTE COUNT: 0 10*3/uL (ref 0.0–0.1)
BASOPHILS ABSOLUTE COUNT: 0 10*3/uL (ref 0.0–0.2)
BASOPHILS RELATIVE PERCENT: 1 %
EOSINOPHILS ABSOLUTE COUNT: 0.2 10*3/uL (ref 0.0–0.4)
EOSINOPHILS RELATIVE PERCENT: 4 %
HEMATOCRIT: 40.8 % (ref 37.5–51.0)
HEMOGLOBIN: 13.6 g/dL (ref 13.0–17.7)
IMMATURE GRANULOCYTES: 1 %
LYMPHOCYTES ABSOLUTE COUNT: 0.8 10*3/uL (ref 0.7–3.1)
LYMPHOCYTES RELATIVE PERCENT: 19 %
MEAN CORPUSCULAR HEMOGLOBIN CONC: 33.3 g/dL (ref 31.5–35.7)
MEAN CORPUSCULAR HEMOGLOBIN: 31.8 pg (ref 26.6–33.0)
MEAN CORPUSCULAR VOLUME: 95 fL (ref 79–97)
MONOCYTES ABSOLUTE COUNT: 0.4 10*3/uL (ref 0.1–0.9)
MONOCYTES RELATIVE PERCENT: 10 %
NEUTROPHILS ABSOLUTE COUNT: 2.8 10*3/uL (ref 1.4–7.0)
NEUTROPHILS RELATIVE PERCENT: 65 %
PLATELET COUNT: 219 10*3/uL (ref 150–450)
RED BLOOD CELL COUNT: 4.28 x10E6/uL (ref 4.14–5.80)
RED CELL DISTRIBUTION WIDTH: 13.3 % (ref 11.6–15.4)
WHITE BLOOD CELL COUNT: 4.3 10*3/uL (ref 3.4–10.8)

## 2023-06-18 MED ORDER — BIKTARVY 50-200-25 MG PO TABS: 1.0000 | ORAL_TABLET | Freq: Every day | ORAL | 11 refills | Status: AC

## 2023-06-18 MED ORDER — BIKTARVY 50 MG-200 MG-25 MG TABLET
ORAL_TABLET | Freq: Every day | ORAL | 11 refills | 30.00 days
Start: 2023-06-18 — End: ?

## 2023-06-19 DIAGNOSIS — Z940 Kidney transplant status: Principal | ICD-10-CM

## 2023-06-19 DIAGNOSIS — B20 Human immunodeficiency virus [HIV] disease: Principal | ICD-10-CM

## 2023-06-19 DIAGNOSIS — Z21 Asymptomatic human immunodeficiency virus [HIV] infection status: Principal | ICD-10-CM

## 2023-06-19 LAB — GC/CHLAMYDIA PROBE, AMP (THROAT)
Chlamydia trachomatis RNA: NOT DETECTED
Neisseria gonorrhoeae RNA: NOT DETECTED

## 2023-06-19 LAB — C. TRACHOMATIS/N. GONORRHOEAE RNA
C. trachomatis RNA, TMA: NOT DETECTED
N. gonorrhoeae RNA, TMA: NOT DETECTED

## 2023-06-19 LAB — MICROSCOPIC EXAMINATION
BACTERIA: NONE SEEN
CASTS: NONE SEEN /LPF
EPITHELIAL CELLS (NON RENAL): NONE SEEN /HPF (ref 0–10)
WBC URINE: NONE SEEN /HPF (ref 0–5)

## 2023-06-19 LAB — RENAL FUNCTION PANEL
ALBUMIN: 3.8 g/dL — ABNORMAL LOW (ref 4.1–5.1)
BLOOD UREA NITROGEN: 31 mg/dL — ABNORMAL HIGH (ref 6–24)
BUN / CREAT RATIO: 14 (ref 9–20)
CALCIUM: 9.9 mg/dL (ref 8.7–10.2)
CHLORIDE: 107 mmol/L — ABNORMAL HIGH (ref 96–106)
CO2: 19 mmol/L — ABNORMAL LOW (ref 20–29)
CREATININE: 2.17 mg/dL — ABNORMAL HIGH (ref 0.76–1.27)
EGFR: 39 mL/min/{1.73_m2} — ABNORMAL LOW
GLUCOSE: 89 mg/dL (ref 70–99)
PHOSPHORUS, SERUM: 3.7 mg/dL (ref 2.8–4.1)
POTASSIUM: 4.6 mmol/L (ref 3.5–5.2)
SODIUM: 139 mmol/L (ref 134–144)

## 2023-06-19 LAB — URINALYSIS WITH MICROSCOPY
BILIRUBIN UA: NEGATIVE
BLOOD UA: NEGATIVE
GLUCOSE UA: NEGATIVE
KETONES UA: NEGATIVE
LEUKOCYTE ESTERASE UA: NEGATIVE
NITRITE UA: NEGATIVE
PH UA: 6 (ref 5.0–7.5)
PROTEIN UA: NEGATIVE
SPECIFIC GRAVITY UA: 1.02 (ref 1.005–1.030)
UROBILINOGEN UA: 0.2 mg/dL (ref 0.2–1.0)

## 2023-06-19 LAB — MAGNESIUM: MAGNESIUM: 1.6 mg/dL (ref 1.6–2.3)

## 2023-06-19 LAB — TACROLIMUS LEVEL: TACROLIMUS BLOOD: 10.4 ng/mL (ref 5.0–20.0)

## 2023-06-19 NOTE — Unmapped (Signed)
 [facility] Refill Coordination Note    Specialty Medication(s) to be Shipped:   Transplant: Envarsus  4mg , Envarsus  1mg , and Prednisone  5mg     Other medication(s) to be shipped: No additional medications requested for fill at this time     Christian Brown, DOB: Feb 27, 1981  Phone: [phone] (home)       All above HIPAA information was verified with patient.     Was a Nurse, learning disability used for this call? No    Completed refill call assessment today to schedule patient's medication shipment from the [facility]  [phone]).  All relevant notes have been reviewed.     Specialty medication(s) and dose(s) confirmed: Regimen is correct and unchanged.   Changes to medications: [name] reports no changes at this time.  Changes to insurance: No  New side effects reported not previously addressed with a pharmacist or physician: None reported  Questions for the pharmacist: No    Confirmed patient received a Conservation officer, historic buildings and a Surveyor, mining with first shipment. The patient will receive a drug information handout for each medication shipped and additional FDA Medication Guides as required.       DISEASE/MEDICATION-SPECIFIC INFORMATION        N/A    SPECIALTY MEDICATION ADHERENCE     Medication Adherence    Patient reported X missed doses in the last month: 0  Specialty Medication: tacrolimus : ENVARSUS  XR 4 mg Tb24 extended release tablet  Patient is on additional specialty medications: Yes  Additional Specialty Medications: tacrolimus : ENVARSUS  XR 1 mg Tb24 extended release tablet  Patient Reported Additional Medication X Missed Doses in the Last Month: 0  Patient is on more than two specialty medications: Yes  Specialty Medication: predniSONE  5 MG tablet (DELTASONE )  Patient Reported Additional Medication X Missed Doses in the Last Month: 0  Any gaps in refill history greater than 2 weeks in the last 3 months: no  Demonstrates understanding of importance of adherence: yes  Informant: patient  Confirmed plan for next specialty medication refill: delivery by pharmacy  Refills needed for supportive medications: not needed          Refill Coordination    Has the Patients' Contact Information Changed: No  Is the Shipping Address Different: No         Were doses missed due to medication being on hold? No    predniSONE  5 mg: 7 days of medicine on hand   tacrolimus : ENVARSUS  XR 4 mg: 7 days of medicine on hand   tacrolimus : ENVARSUS  XR 1 mg: 7 days of medicine on hand       REFERRAL TO PHARMACIST     Referral to the pharmacist: Not needed      SHIPPING     Shipping address confirmed in Epic.     Cost and Payment: Patient has a $0 copay, payment information is not required.    Delivery Scheduled: Yes, Expected medication delivery date: 06/22/23.     Medication will be delivered via UPS to the prescription address in Epic WAM.    Loretta Romp   [facility]  Specialty Technician

## 2023-06-19 NOTE — Unmapped (Signed)
 My chart message sent to patient as reminder to get labs prior to visit on Friday

## 2023-06-20 ENCOUNTER — Encounter: Payer: Self-pay | Admitting: Infectious Disease

## 2023-06-20 ENCOUNTER — Telehealth: Payer: Self-pay

## 2023-06-20 ENCOUNTER — Other Ambulatory Visit: Payer: Self-pay | Admitting: Infectious Disease

## 2023-06-20 ENCOUNTER — Other Ambulatory Visit: Payer: Self-pay

## 2023-06-20 MED ORDER — DAPSONE 100 MG PO TABS: 100.0000 mg | ORAL_TABLET | Freq: Every day | ORAL | 5 refills | Status: DC

## 2023-06-20 MED ORDER — DAPSONE 100 MG PO TABS: 100.0000 mg | ORAL_TABLET | Freq: Every day | ORAL | 5 refills | Status: AC

## 2023-06-20 MED ORDER — DAPSONE 100 MG TABLET
ORAL_TABLET | Freq: Every day | ORAL | 5 refills | 30.00000 days
Start: 2023-06-20 — End: ?

## 2023-06-20 NOTE — Telephone Encounter (Signed)
-----   Message from [facility] sent at 06/20/2023  9:10 AM EDT ----- Jim Wyatt had a CD4 count in the 160s I wanted to put him on dapsone  to help prophylax against PCP can you let him know I have sent a prescription for [facility] pharmacy

## 2023-06-20 NOTE — Telephone Encounter (Signed)
 Patient stated that he can't take dapsone . Kidney transplant team took him off of dapsone  due to it lowering his blood cell count.

## 2023-06-20 NOTE — Telephone Encounter (Signed)
 Patient spoke to Kidney Transplant team that stated that he could resume dapsone . Patient aware RX was sent to [facility] pharmacy.

## 2023-06-20 NOTE — Progress Notes (Signed)
 da

## 2023-06-21 LAB — T-HELPER CELLS (CD4) COUNT (NOT AT ARMC)
Absolute CD4: 169 {cells}/uL — ABNORMAL LOW (ref 490–1740)
CD4 T Helper %: 22 % — ABNORMAL LOW (ref 30–61)
Total lymphocyte count: 765 {cells}/uL — ABNORMAL LOW (ref 850–3900)

## 2023-06-21 LAB — HIV-1 RNA QUANT-NO REFLEX-BLD
HIV 1 RNA Quant: <20 {copies}/mL — AB
HIV-1 RNA Quant, Log: <1.3 {Log_copies}/mL — AB

## 2023-06-21 LAB — RPR: RPR Ser Ql: NONREACTIVE

## 2023-06-22 ENCOUNTER — Ambulatory Visit: Admit: 2023-06-22 | Discharge: 2023-06-22 | Payer: MEDICARE

## 2023-06-22 ENCOUNTER — Inpatient Hospital Stay: Admit: 2023-06-22 | Discharge: 2023-06-22 | Payer: MEDICARE

## 2023-06-22 DIAGNOSIS — Z796 Long term current use of immunosuppressive drug: Principal | ICD-10-CM

## 2023-06-22 DIAGNOSIS — Z949 Transplanted organ and tissue status, unspecified: Principal | ICD-10-CM

## 2023-06-22 DIAGNOSIS — N184 Chronic kidney disease, stage 4 (severe): Principal | ICD-10-CM

## 2023-06-22 DIAGNOSIS — Z940 Kidney transplant status: Principal | ICD-10-CM

## 2023-06-22 DIAGNOSIS — E119 Type 2 diabetes mellitus without complications: Principal | ICD-10-CM

## 2023-06-22 DIAGNOSIS — I158 Other secondary hypertension: Principal | ICD-10-CM

## 2023-06-22 LAB — HEPATIC FUNCTION PANEL
ALBUMIN: 3.6 g/dL (ref 3.4–5.0)
ALKALINE PHOSPHATASE: 179 U/L — ABNORMAL HIGH (ref 46–116)
ALT (SGPT): <7 U/L — ABNORMAL LOW (ref 10–49)
AST (SGOT): 13 U/L (ref <=34)
BILIRUBIN DIRECT: 0.2 mg/dL (ref 0.00–0.30)
BILIRUBIN TOTAL: 0.6 mg/dL (ref 0.3–1.2)
PROTEIN TOTAL: 8.2 g/dL (ref 5.7–8.2)

## 2023-06-22 LAB — URINALYSIS WITH MICROSCOPY
BACTERIA: NONE SEEN /HPF
BILIRUBIN UA: NEGATIVE
BLOOD UA: NEGATIVE
GLUCOSE UA: NEGATIVE
KETONES UA: NEGATIVE
LEUKOCYTE ESTERASE UA: NEGATIVE
NITRITE UA: NEGATIVE
PH UA: 5.5 (ref 5.0–9.0)
RBC UA: 2 /HPF (ref <=3)
SPECIFIC GRAVITY UA: 1.026 (ref 1.003–1.030)
SQUAMOUS EPITHELIAL: <1 /HPF (ref 0–5)
UROBILINOGEN UA: <2
WBC UA: 2 /HPF (ref <=2)

## 2023-06-22 LAB — CBC W/ AUTO DIFF
BASOPHILS ABSOLUTE COUNT: 0.1 10*9/L (ref 0.0–0.1)
BASOPHILS RELATIVE PERCENT: 0.9 %
EOSINOPHILS ABSOLUTE COUNT: 0.3 10*9/L (ref 0.0–0.5)
EOSINOPHILS RELATIVE PERCENT: 5.6 %
HEMATOCRIT: 41.2 % (ref 39.0–48.0)
HEMOGLOBIN: 14.2 g/dL (ref 12.9–16.5)
LYMPHOCYTES ABSOLUTE COUNT: 0.8 10*9/L — ABNORMAL LOW (ref 1.1–3.6)
LYMPHOCYTES RELATIVE PERCENT: 13.5 %
MEAN CORPUSCULAR HEMOGLOBIN CONC: 34.5 g/dL (ref 32.0–36.0)
MEAN CORPUSCULAR HEMOGLOBIN: 32.2 pg (ref 25.9–32.4)
MEAN CORPUSCULAR VOLUME: 93.2 fL (ref 77.6–95.7)
MEAN PLATELET VOLUME: 8.4 fL (ref 6.8–10.7)
MONOCYTES ABSOLUTE COUNT: 0.5 10*9/L (ref 0.3–0.8)
MONOCYTES RELATIVE PERCENT: 7.8 %
NEUTROPHILS ABSOLUTE COUNT: 4.5 10*9/L (ref 1.8–7.8)
NEUTROPHILS RELATIVE PERCENT: 72.2 %
PLATELET COUNT: 179 10*9/L (ref 150–450)
RED BLOOD CELL COUNT: 4.42 10*12/L (ref 4.26–5.60)
RED CELL DISTRIBUTION WIDTH: 14.8 % (ref 12.2–15.2)
WBC ADJUSTED: 6.2 10*9/L (ref 3.6–11.2)

## 2023-06-22 LAB — BASIC METABOLIC PANEL
ANION GAP: 11 mmol/L (ref 5–14)
BLOOD UREA NITROGEN: 25 mg/dL — ABNORMAL HIGH (ref 9–23)
BUN / CREAT RATIO: 13
CALCIUM: 10 mg/dL (ref 8.7–10.4)
CHLORIDE: 106 mmol/L (ref 98–107)
CO2: 24 mmol/L (ref 20.0–31.0)
CREATININE: 1.93 mg/dL — ABNORMAL HIGH (ref 0.73–1.18)
EGFR CKD-EPI (2021) MALE: 44 mL/min/1.73m2 — ABNORMAL LOW (ref >=60)
GLUCOSE RANDOM: 89 mg/dL (ref 70–99)
POTASSIUM: 4.3 mmol/L (ref 3.4–4.8)
SODIUM: 141 mmol/L (ref 135–145)

## 2023-06-22 LAB — HEMOGLOBIN A1C
ESTIMATED AVERAGE GLUCOSE: 105 mg/dL
HEMOGLOBIN A1C: 5.3 % (ref 4.8–5.6)

## 2023-06-22 LAB — PHOSPHORUS: PHOSPHORUS: 4.1 mg/dL (ref 2.4–5.1)

## 2023-06-22 LAB — CMV DNA, QUANTITATIVE, PCR: CMV VIRAL LD: NOT DETECTED

## 2023-06-22 LAB — PROTEIN / CREATININE RATIO, URINE
CREATININE, URINE: 227.6 mg/dL
PROTEIN URINE: 34.2 mg/dL
PROTEIN/CREAT RATIO, URINE: 0.15

## 2023-06-22 LAB — MAGNESIUM: MAGNESIUM: 1.6 mg/dL (ref 1.6–2.6)

## 2023-06-22 LAB — BK VIRUS QUANTITATIVE PCR, BLOOD: BK BLOOD RESULT: NOT DETECTED

## 2023-06-22 MED ORDER — HYDROCHLOROTHIAZIDE 12.5 MG TABLET
ORAL_TABLET | Freq: Every day | ORAL | 3 refills | 100.00000 days | Status: CN
Start: 2023-06-22 — End: 2024-07-26

## 2023-06-22 MED ORDER — CALCITRIOL 0.5 MCG CAPSULE
ORAL_CAPSULE | Freq: Two times a day (BID) | ORAL | 11 refills | 30.00000 days | Status: CP
Start: 2023-06-22 — End: 2024-06-21
  Filled 2023-07-13: qty 120, 30d supply, fill #0

## 2023-06-22 MED ORDER — DILTIAZEM CD 120 MG CAPSULE,EXTENDED RELEASE 24 HR
ORAL_CAPSULE | Freq: Every day | ORAL | 5 refills | 30.00000 days | Status: CP
Start: 2023-06-22 — End: 2023-12-19
  Filled 2023-06-26: qty 30, 30d supply, fill #0

## 2023-06-22 MED FILL — ENVARSUS XR 4 MG TABLET,EXTENDED RELEASE: ORAL | 30 days supply | Qty: 180 | Fill #1

## 2023-06-22 MED FILL — ENVARSUS XR 1 MG TABLET,EXTENDED RELEASE: ORAL | 30 days supply | Qty: 180 | Fill #1

## 2023-06-22 NOTE — Unmapped (Signed)
 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 Treated with IV steroids.     # Immunosuppression  Tacrolimus: Envarsus reduce to 12 mg BID, goal trough 6-8 ng/mL   - starting diltizem which will boost tacrolimus levels  Azathioprine previously reduced Jan 2025 to 100 mg daily, goal to boost dose in future when cytopenias improve, and/ or switch to mycophenolate.  Prednisone 5 mg daily (indicated due to HIVAN)  Initially MMF but did not tolerate due to gastritis; he may benefit from trial of Myfortic in future due to his elevated rejection risk    # Acute issues today  Hx gastritis+PUD, tx PPI, is due for followup endoscopy, not scheduled. Pt states improvement in symptoms and may defer this for now    # BP management   Goal 130/80, as tolerated.  - STOP carvedilol   - START diltiazem 120 mg daily (dual indication to boost tacrolimus)   - minoxidil 2.5 mg daily  For future:  We discussed whether to hydrochlorothiazide 12.5 mg daily, for lower extremity edema. I'd like to make one BP med change at at time, since we are adding diltiazem let's wait on this change.  Intolerances to amlodipine/nifedipine and hydralazine, hives to metoprolol and labetalol.    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 He is s/p repeat parathyroidectomy 03/14/23 (prior parathyroidetomy April 2021).   - calcitriol - reduce to 1 mcg BID  - for VitD level 11: on ergocalciferol 50,000 units weekly x12 weeks, started 05/14/23  - Tums 6 tabs 4x/day - each tablet contains 400 mg elemental Ca  - Magnesium: 2 gummies BID     # Immunizations  Immunization History   Administered Date(s) Administered    COVID-19 VAC,BIVALENT,MODERNA(BLUE CAP) 10/10/2021    COVID-19 VACCINE,MRNA(MODERNA)(PF) 09/28/2019, 10/26/2019    HEPATITIS B VACCINE ADULT, ADJUVANTED, IM(HEPLISAV B) 01/16/2020, 03/18/2020, 05/12/2020    HEPATITIS B VACCINE ADULT,IM(ENERGIX B, RECOMBIVAX) 03/30/2015, 01/27/2016    Hepatitis A (Adult) 09/04/2016, 04/26/2017, 10/10/2021    Hepatitis A Vaccine - Unspecified Formulation 09/04/2016, 04/26/2017    Hepatitis B Vaccine, Unspecified Formulation 12/04/1994, 01/09/1995, 06/12/1995    INFLUENZA TIV (TRI) 52MO+ W/ PRESERV (IM) 11/20/2013, 11/11/2014, 03/15/2016, 11/27/2017, 12/11/2018    INFLUENZA TIV (TRI) PF (IM)(HISTORICAL) 03/15/2016    INFLUENZA VACCINE IIV3(IM)(PF)6 MOS UP 12/14/2022    Influenza Vaccine PF(Quad)(Egg Free)18+(Flublok) 12/09/2021    Influenza Vaccine Quad (IIV4 W/PRESERV)  21MO+ 11/01/2016, 11/26/2018    Influenza Vaccine Quad(IM)6 MO-Adult(PF) 11/20/2013, 11/11/2014, 11/29/2020    Influenza Virus Vaccine, unspecified formulation 11/27/2017, 12/11/2018, 12/12/2019, 11/27/2020    MENINGOCOCCAL VACCINE, A,C,Y, W-135(IM)(MENVEO) 07/10/2016, 03/22/2017    MMR 01/09/1995    Meningococcal Conjugate MCV4P 07/10/2016, 03/22/2017    PNEUMOCOCCAL POLYSACCHARIDE 23-VALENT 11/20/2013, 11/20/2013, 11/26/2018, 11/26/2018    Pneumococcal Conjugate 13-Valent 08/28/2017, 08/28/2017, 12/12/2019    TD(TDVAX),ADSORBED,2LF(IM)(PF) 06/12/1995    TdaP 03/08/2018, 03/08/2018       # Cancer screening  Colonoscopy: start age 72  Skin: recommend yearly dermatology evaluation      Kidney Transplant History:   Date of Transplant: 09/05/2022 (Kidney)  Type of Transplant: DDKT  KDPI: 18%  Cold ischemic time: 972 minutes  Warm ischemic time: 40 minutes  cPRA: 0-7%  HLA match: 4/6  Blood type: Donor O, Recipient O POS  ID: CMV: D-/R+ EBV: D+/R+, HCV donor serologies not immediately available (requesting this info)  Native Kidney Disease: collapsing FSGS/HIVAN (biopsy proven)    Native kidney biopsy: yes   Pre-transplant dialysis course: hemodialysis since 02/11/2015   Post-Transplant Course:    Delayed graft function requiring dialysis: no   Other complications: readmitted 7/31-8/2 for nausea/vomiting  Prior Transplants: None  Induction: thymoglobulin  Early steroid withdrawal: No (due to HIVAN)    Biopsies:   Zero-Hour Biopsy: none    History of Presenting Illness:     Since the last visit:  - home BP not checking  - s/p parathyroidectomy 1/15, readmitted to [facility] 1/21-1/22 for hypocalcemia due to hungry bone syndrome (he was symptomatic with numbness before readmission; no recurrence of numbness)    Concerns about nonadherence: No    Social: lives in [place]. 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     Review of Systems:   A 12-system review was negative except as documented in the HPI.    Physical Exam:     BP 135/91 (BP Site: R Arm, BP Position: Sitting, BP Cuff Size: Large)  - Pulse 94  - Temp 36.8 ??C (98.3 ??F) (Temporal)  - Wt 85.5 kg (188 lb 6.4 oz)  - BMI 29.51 kg/m??   Constitutional:  Well-appearing in NAD  Eyes:  anicteric sclerae  ENT:  MMM  CV:  RRR, no m/r/g, no JVD, extremities WWP with no edema  Resp:  Good air movement, CTAB  GI:  Abdomen soft, NTND, +bs  MSK:  Grossly normal, exam is limited  Skin:  Normal turgor, no rash  Neuro:  Grossly normal, exam is limited  Psych:  Normal affect  Dialysis access: LUE AVF forearm - no bruit or thrill as of the 03/28/23 visit      Allergies:   Allergies   Allergen Reactions    Amlodipine Hives, Palpitations, Rash and Other (See Comments)     Causes fatigue      Doxercalciferol Hives    Fish Containing Products Hives and Shortness Of Breath     Please do not serve fish for meals. Highly allergic         Iron Sucrose Hives    Labetalol Itching and Rash    Metoclopramide Hives and Other (See Comments)     Causes ticks/ jerks    Metoprolol Hives    Nifedipine Hives, Itching and Shortness Of Breath    Other Swelling     Suture material, localized swelling at closure site    Sulfamethoxazole-Trimethoprim Rash and Other (See Comments)     Renal failure  Vancomycin Hives and Other (See Comments)     Patient states vancomycin caused kidney injury    Calcitonin     Peanut Oil Other (See Comments)    Potassium Clavulanate     Azathioprine Itching and Rash    Chlorhexidine Itching     Pre-op wipes only    Clindamycin Other (See Comments) and Palpitations     Chest pain    Hydralazine Itching and Dizziness           Sensipar [Cinacalcet] Hives        Current Medications:   Current Outpatient Medications   Medication Sig Dispense Refill    albuterol HFA 90 mcg/actuation inhaler Inhale 1-2 puffs every four (4) to six (6) hours as needed for wheezing or shortness of breath. 18 g 11    azathioprine (AZASAN) 100 mg tablet Take 1 tablet (100 mg total) by mouth daily. 90 tablet 3    bictegrav-emtricit-tenofov ala (BIKTARVY) 50-200-25 mg tablet Take 1 tablet by mouth every morning. 30 tablet 11    bictegrav-emtricit-tenofov ala (BIKTARVY) 50-200-25 mg tablet Take 1 tablet by mouth daily. 30 tablet 11    calcitriol (ROCALTROL) 0.5 MCG capsule Take 4 capsules (2 mcg total) by mouth two (2) times a day. 240 capsule 11    carvedilol (COREG) 12.5 MG tablet Take 1 tablet (12.5 mg total) by mouth two (2) times a day. 180 tablet 3    DIETARY SUPPLEMENT,MISC COMB14 ORAL Taking magnesium gummy supplement once daily. Dosage strength & product unknown      empty container Misc Use as directed with Zarxio (Patient not taking: Reported on 03/21/2023) 1 each 0    ergocalciferol-1,250 mcg, 50,000 unit, (DRISDOL) 1,250 mcg (50,000 unit) capsule Take 1 capsule (1,250 mcg total) by mouth once a week. 12 capsule 1    filgrastim-sndz (ZARXIO) 480 mcg/0.8 mL Syrg Inject 0.8 mL (480 mcg total) under the skin once a week. Administer only as directed by transplant team. (Patient not taking: Reported on 03/21/2023) 3.2 mL 1    HYDROcodone-acetaminophen (NORCO) 5-325 mg per tablet Take 1 tablet by mouth daily as needed for pain.      minoxidil (LONITEN) 2.5 MG tablet Take 1 tablet (2.5 mg total) by mouth daily. 90 tablet 3    pantoprazole (PROTONIX) 40 MG tablet Take 1 tablet (40 mg total) by mouth daily as needed. 90 tablet 1    predniSONE (DELTASONE) 5 MG tablet Take 1 tablet (5 mg total) by mouth in the morning. 90 tablet 3    tacrolimus (ENVARSUS XR) 1 mg Tb24 extended release tablet Take 3 tablets (3 mg total) by mouth two (2) times a day. Take in addition to 4 mg tablets for a total dose of 15 mg twice daily 540 tablet 3    tacrolimus (ENVARSUS XR) 4 mg Tb24 extended release tablet Take 3 tablets (12 mg total) by mouth two (2) times a day. Take in addition to 1 mg tablets for a total dose of 15 mg bid 540 tablet 3     No current facility-administered medications for this visit.       Past Medical History:   Past Medical History:   Diagnosis Date    Acquired immunodeficiency syndrome 11/14/2013    Formatting of this note might be different from the original.   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   He  appears to be doing well. States he is back to 90% except for his leg swelling.    Will change his epivir to 150 mg daily when he completes his liquid rx. Will see him back in 1 month and repeat his labs th    Acute deep vein thrombosis (DVT) of popliteal vein of left lower extremity 02/05/2019    Last Assessment & Plan:   Formatting of this note might be different from the original.  Moderate effusion left knee new the some limitation of motion.  No evidence of sepsis the white count the normal  will     Proceed on with an aspiration for cell count, culture and crystal analysis.  New x-rays ordered and currently pending will follow.  Further therapeutic recommendations contingent on the re    Anemia     Asthma     Candidal esophagitis 07/30/2022    Community acquired pneumonia 07/30/2014    DVT (deep venous thrombosis) 11/24/2013    Last Assessment & Plan:    Formatting of this note might be different from the original.   Appears to be doing better (swelling improved). He will f/u with PCP regarding coumadin dosing (INR is low).  Formatting of this note might be different from the original.   Last Assessment & Plan:    Formatting of this note might be different from the original.   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   greatly appreciate renal f/u.  Formatting of this note might be different from the original.   Last Assessment & Plan:    greatly appreciate renal f/u.   Last Assessment & Plan:    greatly appreciate renal f/u.  Formatting of this note might be different from the original.   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        Electronically signed by:   Selina Dale, MD  [facility]

## 2023-06-22 NOTE — Unmapped (Addendum)
 My email is [email or web address]  I am worried about dapsone because it caused neutropenia for you after we started it on 11/02/22 in combination with azathioprine. Please provide my email address to your ID doctor for coordination of care. A switch from azathioprine to mycophenolate may help allow you to tolerate the dapsone but hard to say for certain. If you start the dapsone will need CBC monitoring.    Today:  - med changes in your new med plan:   - start diltiazem 120 mg daily (long acting formulation)  - STOP carvedilol 12.5 mg twice daily (the diltiazem will replace this for BP)  - reduce the Envarsus dose to three 4 mg tablets (total dose 12 mg twice daily)   - hold onto your 1 mg Envarsus tablets  - repeat lab with morning tacrolimus trough level 5-7 days after making the change.   - goal tacrolimus level 6-8 when check in the morning before your meds  - labs once a week until tacrolimus level at goal, then reduce frequency    Other med changes:  - reduce calcitriol to 2 tabs twice daily  - continue ergocalciferol weekly    For future:  We discussed whether to hydrochlorothiazide 12.5 mg daily, for lower extremity edema. I'd like to make one BP med change at at time, since we are adding diltiazem let's wait on this change.    We will consider a future switch from azathioprine back to mycophenolate (Myfortic 360 mg twice daily, with option to change back if you have problematic side effects).

## 2023-06-23 LAB — TACROLIMUS LEVEL, TROUGH: TACROLIMUS, TROUGH: 7.3 ng/mL (ref 5.0–15.0)

## 2023-06-25 DIAGNOSIS — Z796 Long term current use of immunosuppressive drug: Principal | ICD-10-CM

## 2023-06-25 DIAGNOSIS — Z940 Kidney transplant status: Principal | ICD-10-CM

## 2023-06-26 MED FILL — DAPSONE 100 MG TABLET: ORAL | 30 days supply | Qty: 30 | Fill #0

## 2023-06-27 NOTE — Progress Notes (Signed)
 The ASCVD Risk score (Arnett DK, et al., 2019) failed to calculate for the following reasons:   The valid total cholesterol range is 130 to 320 mg/dL  Jim Wyatt, BSN, RN

## 2023-06-28 LAB — HLA DS POST TRANSPLANT
ANTI-DONOR DRW #1 MFI: 0 MFI
ANTI-DONOR DRW #2 MFI: 0 MFI
ANTI-DONOR HLA-A #1 MFI: 0 MFI
ANTI-DONOR HLA-A #2 MFI: 0 MFI
ANTI-DONOR HLA-B #1 MFI: 0 MFI
ANTI-DONOR HLA-B #2 MFI: 0 MFI
ANTI-DONOR HLA-C #1 MFI: 12 MFI
ANTI-DONOR HLA-DP #2 MFI: 25 MFI
ANTI-DONOR HLA-DQB #1 MFI: 0 MFI
ANTI-DONOR HLA-DQB #2 MFI: 133 MFI
ANTI-DONOR HLA-DR #1 MFI: 25 MFI
ANTI-DONOR HLA-DR #2 MFI: 2 MFI

## 2023-06-28 LAB — FSAB CLASS 2 ANTIBODY SPECIFICITY: HLA CL2 AB RESULT: NEGATIVE

## 2023-06-28 LAB — ALLOSURE KIDNEY
ALLOSURE: 0.11 %
ALLOSURE: 0.11 %

## 2023-06-28 LAB — FSAB CLASS 1 ANTIBODY SPECIFICITY: HLA CLASS 1 ANTIBODY RESULT: NEGATIVE

## 2023-07-02 DIAGNOSIS — Z796 Long term current use of immunosuppressive drug: Principal | ICD-10-CM

## 2023-07-02 DIAGNOSIS — Z940 Kidney transplant status: Principal | ICD-10-CM

## 2023-07-03 LAB — CBC W/ DIFFERENTIAL
BANDED NEUTROPHILS ABSOLUTE COUNT: 0 10*3/uL (ref 0.0–0.1)
BASOPHILS ABSOLUTE COUNT: 0 10*3/uL (ref 0.0–0.2)
BASOPHILS RELATIVE PERCENT: 1 %
EOSINOPHILS ABSOLUTE COUNT: 0.4 10*3/uL (ref 0.0–0.4)
EOSINOPHILS RELATIVE PERCENT: 9 %
HEMATOCRIT: 40.6 % (ref 37.5–51.0)
HEMOGLOBIN: 13.5 g/dL (ref 13.0–17.7)
IMMATURE GRANULOCYTES: 0 %
LYMPHOCYTES ABSOLUTE COUNT: 0.8 10*3/uL (ref 0.7–3.1)
LYMPHOCYTES RELATIVE PERCENT: 20 %
MEAN CORPUSCULAR HEMOGLOBIN CONC: 33.3 g/dL (ref 31.5–35.7)
MEAN CORPUSCULAR HEMOGLOBIN: 31.4 pg (ref 26.6–33.0)
MEAN CORPUSCULAR VOLUME: 94 fL (ref 79–97)
MONOCYTES ABSOLUTE COUNT: 0.4 10*3/uL (ref 0.1–0.9)
MONOCYTES RELATIVE PERCENT: 10 %
NEUTROPHILS ABSOLUTE COUNT: 2.4 10*3/uL (ref 1.4–7.0)
NEUTROPHILS RELATIVE PERCENT: 60 %
PLATELET COUNT: 177 10*3/uL (ref 150–450)
RED BLOOD CELL COUNT: 4.3 x10E6/uL (ref 4.14–5.80)
RED CELL DISTRIBUTION WIDTH: 13.5 % (ref 11.6–15.4)
WHITE BLOOD CELL COUNT: 4 10*3/uL (ref 3.4–10.8)

## 2023-07-04 LAB — URINALYSIS WITH MICROSCOPY
BILIRUBIN UA: NEGATIVE
BLOOD UA: NEGATIVE
GLUCOSE UA: NEGATIVE
KETONES UA: NEGATIVE
LEUKOCYTE ESTERASE UA: NEGATIVE
NITRITE UA: NEGATIVE
PH UA: 6 (ref 5.0–7.5)
SPECIFIC GRAVITY UA: 1.023 (ref 1.005–1.030)
UROBILINOGEN UA: 0.2 mg/dL (ref 0.2–1.0)

## 2023-07-04 LAB — MICROSCOPIC EXAMINATION
BACTERIA: NONE SEEN
CASTS: NONE SEEN /LPF
EPITHELIAL CELLS (NON RENAL): NONE SEEN /HPF (ref 0–10)
RBC URINE: NONE SEEN /HPF (ref 0–2)

## 2023-07-04 LAB — RENAL FUNCTION PANEL
ALBUMIN: 4.1 g/dL (ref 4.1–5.1)
BLOOD UREA NITROGEN: 31 mg/dL — ABNORMAL HIGH (ref 6–24)
BUN / CREAT RATIO: 15 (ref 9–20)
CALCIUM: 9.8 mg/dL (ref 8.7–10.2)
CHLORIDE: 105 mmol/L (ref 96–106)
CO2: 21 mmol/L (ref 20–29)
CREATININE: 2.08 mg/dL — ABNORMAL HIGH (ref 0.76–1.27)
EGFR: 41 mL/min/{1.73_m2} — ABNORMAL LOW
GLUCOSE: 96 mg/dL (ref 70–99)
PHOSPHORUS, SERUM: 4.1 mg/dL (ref 2.8–4.1)
POTASSIUM: 4.5 mmol/L (ref 3.5–5.2)
SODIUM: 139 mmol/L (ref 134–144)

## 2023-07-04 LAB — TACROLIMUS LEVEL: TACROLIMUS BLOOD: 7.1 ng/mL (ref 5.0–20.0)

## 2023-07-04 LAB — MAGNESIUM: MAGNESIUM: 1.4 mg/dL — ABNORMAL LOW (ref 1.6–2.3)

## 2023-07-09 DIAGNOSIS — Z796 Long term current use of immunosuppressive drug: Principal | ICD-10-CM

## 2023-07-09 DIAGNOSIS — Z940 Kidney transplant status: Principal | ICD-10-CM

## 2023-07-10 DIAGNOSIS — Z940 Kidney transplant status: Principal | ICD-10-CM

## 2023-07-10 NOTE — Unmapped (Signed)
 The [facility] has made a second and final attempt to reach this patient to refill the following medication:Envarsus 4mg  and 1mg , azathioprine, Biktarvy.      We have left voicemails on the following phone numbers: [phone], have sent a MyChart message, and have sent a text message to the following phone numbers: [phone].    Dates contacted: 07/05/23 and 07/10/23  Last scheduled delivery: 06/11/23 (azathioprine & Biktarvy) and 06/22/23 (Envarsus)    The patient may be at risk of non-compliance with this medication. The patient should call the [facility] at [phone]  Option 4, then Option 4: Infectious Disease, Transplant to refill medication.    Loretta Romp   [facility] Special[facility] and [facility]

## 2023-07-11 LAB — CBC W/ DIFFERENTIAL
BANDED NEUTROPHILS ABSOLUTE COUNT: 0 10*3/uL (ref 0.0–0.1)
BASOPHILS ABSOLUTE COUNT: 0 10*3/uL (ref 0.0–0.2)
BASOPHILS RELATIVE PERCENT: 1 %
EOSINOPHILS ABSOLUTE COUNT: 0.1 10*3/uL (ref 0.0–0.4)
EOSINOPHILS RELATIVE PERCENT: 2 %
HEMATOCRIT: 42 % (ref 37.5–51.0)
HEMOGLOBIN: 13.7 g/dL (ref 13.0–17.7)
IMMATURE GRANULOCYTES: 1 %
LYMPHOCYTES ABSOLUTE COUNT: 0.7 10*3/uL (ref 0.7–3.1)
LYMPHOCYTES RELATIVE PERCENT: 15 %
MEAN CORPUSCULAR HEMOGLOBIN CONC: 32.6 g/dL (ref 31.5–35.7)
MEAN CORPUSCULAR HEMOGLOBIN: 30.9 pg (ref 26.6–33.0)
MEAN CORPUSCULAR VOLUME: 95 fL (ref 79–97)
MONOCYTES ABSOLUTE COUNT: 0.4 10*3/uL (ref 0.1–0.9)
MONOCYTES RELATIVE PERCENT: 8 %
NEUTROPHILS ABSOLUTE COUNT: 3.5 10*3/uL (ref 1.4–7.0)
NEUTROPHILS RELATIVE PERCENT: 73 %
PLATELET COUNT: 212 10*3/uL (ref 150–450)
RED BLOOD CELL COUNT: 4.44 x10E6/uL (ref 4.14–5.80)
RED CELL DISTRIBUTION WIDTH: 13.6 % (ref 11.6–15.4)
WHITE BLOOD CELL COUNT: 4.8 10*3/uL (ref 3.4–10.8)

## 2023-07-11 LAB — RENAL FUNCTION PANEL
ALBUMIN: 4.2 g/dL (ref 4.1–5.1)
BLOOD UREA NITROGEN: 26 mg/dL — ABNORMAL HIGH (ref 6–24)
BUN / CREAT RATIO: 12 (ref 9–20)
CALCIUM: 10.3 mg/dL — ABNORMAL HIGH (ref 8.7–10.2)
CHLORIDE: 107 mmol/L — ABNORMAL HIGH (ref 96–106)
CO2: 22 mmol/L (ref 20–29)
CREATININE: 2.11 mg/dL — ABNORMAL HIGH (ref 0.76–1.27)
EGFR: 40 mL/min/{1.73_m2} — ABNORMAL LOW
GLUCOSE: 108 mg/dL — ABNORMAL HIGH (ref 70–99)
PHOSPHORUS, SERUM: 3.6 mg/dL (ref 2.8–4.1)
POTASSIUM: 5.1 mmol/L (ref 3.5–5.2)
SODIUM: 142 mmol/L (ref 134–144)

## 2023-07-11 LAB — MICROSCOPIC EXAMINATION
BACTERIA: NONE SEEN
CASTS: NONE SEEN /LPF
EPITHELIAL CELLS (NON RENAL): NONE SEEN /HPF (ref 0–10)
RBC URINE: NONE SEEN /HPF (ref 0–2)

## 2023-07-11 LAB — URINALYSIS WITH MICROSCOPY
BILIRUBIN UA: NEGATIVE
BLOOD UA: NEGATIVE
GLUCOSE UA: NEGATIVE
KETONES UA: NEGATIVE
LEUKOCYTE ESTERASE UA: NEGATIVE
NITRITE UA: NEGATIVE
PH UA: 6 (ref 5.0–7.5)
SPECIFIC GRAVITY UA: 1.024 (ref 1.005–1.030)
UROBILINOGEN UA: 0.2 mg/dL (ref 0.2–1.0)

## 2023-07-11 LAB — MAGNESIUM: MAGNESIUM: 1.5 mg/dL — ABNORMAL LOW (ref 1.6–2.3)

## 2023-07-12 LAB — TACROLIMUS LEVEL: TACROLIMUS BLOOD: 12.6 ng/mL (ref 5.0–20.0)

## 2023-07-13 MED FILL — AZATHIOPRINE 100 MG TABLET: ORAL | 90 days supply | Qty: 90 | Fill #1

## 2023-07-13 MED FILL — VENTOLIN HFA 90 MCG/ACTUATION AEROSOL INHALER: RESPIRATORY_TRACT | 17 days supply | Qty: 18 | Fill #7

## 2023-07-16 DIAGNOSIS — Z796 Long term current use of immunosuppressive drug: Principal | ICD-10-CM

## 2023-07-16 DIAGNOSIS — Z940 Kidney transplant status: Principal | ICD-10-CM

## 2023-07-19 DIAGNOSIS — Z940 Kidney transplant status: Principal | ICD-10-CM

## 2023-07-19 LAB — URINALYSIS WITH MICROSCOPY
BILIRUBIN UA: NEGATIVE
BLOOD UA: NEGATIVE
GLUCOSE UA: NEGATIVE
LEUKOCYTE ESTERASE UA: NEGATIVE
NITRITE UA: NEGATIVE
PH UA: 5.5 (ref 5.0–7.5)
SPECIFIC GRAVITY UA: 1.026 (ref 1.005–1.030)
UROBILINOGEN UA: 0.2 mg/dL (ref 0.2–1.0)

## 2023-07-19 LAB — CBC W/ DIFFERENTIAL
BANDED NEUTROPHILS ABSOLUTE COUNT: 0 10*3/uL (ref 0.0–0.1)
BASOPHILS ABSOLUTE COUNT: 0 10*3/uL (ref 0.0–0.2)
BASOPHILS RELATIVE PERCENT: 0 %
EOSINOPHILS ABSOLUTE COUNT: 0 10*3/uL (ref 0.0–0.4)
EOSINOPHILS RELATIVE PERCENT: 1 %
HEMATOCRIT: 42.3 % (ref 37.5–51.0)
HEMOGLOBIN: 14.4 g/dL (ref 13.0–17.7)
IMMATURE GRANULOCYTES: 0 %
LYMPHOCYTES ABSOLUTE COUNT: 0.8 10*3/uL (ref 0.7–3.1)
LYMPHOCYTES RELATIVE PERCENT: 12 %
MEAN CORPUSCULAR HEMOGLOBIN CONC: 34 g/dL (ref 31.5–35.7)
MEAN CORPUSCULAR HEMOGLOBIN: 32 pg (ref 26.6–33.0)
MEAN CORPUSCULAR VOLUME: 94 fL (ref 79–97)
MONOCYTES ABSOLUTE COUNT: 0.5 10*3/uL (ref 0.1–0.9)
MONOCYTES RELATIVE PERCENT: 8 %
NEUTROPHILS ABSOLUTE COUNT: 5 10*3/uL (ref 1.4–7.0)
NEUTROPHILS RELATIVE PERCENT: 79 %
PLATELET COUNT: 220 10*3/uL (ref 150–450)
RED BLOOD CELL COUNT: 4.5 x10E6/uL (ref 4.14–5.80)
RED CELL DISTRIBUTION WIDTH: 14.2 % (ref 11.6–15.4)
WHITE BLOOD CELL COUNT: 6.4 10*3/uL (ref 3.4–10.8)

## 2023-07-19 LAB — RENAL FUNCTION PANEL
ALBUMIN: 4 g/dL — ABNORMAL LOW (ref 4.1–5.1)
BLOOD UREA NITROGEN: 25 mg/dL — ABNORMAL HIGH (ref 6–24)
BUN / CREAT RATIO: 12 (ref 9–20)
CALCIUM: 10.1 mg/dL (ref 8.7–10.2)
CHLORIDE: 105 mmol/L (ref 96–106)
CO2: 22 mmol/L (ref 20–29)
CREATININE: 2.07 mg/dL — ABNORMAL HIGH (ref 0.76–1.27)
EGFR: 41 mL/min/{1.73_m2} — ABNORMAL LOW
GLUCOSE: 112 mg/dL — ABNORMAL HIGH (ref 70–99)
PHOSPHORUS, SERUM: 3.5 mg/dL (ref 2.8–4.1)
POTASSIUM: 4.6 mmol/L (ref 3.5–5.2)
SODIUM: 139 mmol/L (ref 134–144)

## 2023-07-19 LAB — MICROSCOPIC EXAMINATION
BACTERIA: NONE SEEN
CASTS: NONE SEEN /LPF
EPITHELIAL CELLS (NON RENAL): NONE SEEN /HPF (ref 0–10)
RBC URINE: NONE SEEN /HPF (ref 0–2)

## 2023-07-19 LAB — MAGNESIUM: MAGNESIUM: 1.5 mg/dL — ABNORMAL LOW (ref 1.6–2.3)

## 2023-07-19 MED FILL — DAPSONE 100 MG TABLET: ORAL | 30 days supply | Qty: 30 | Fill #0

## 2023-07-19 MED FILL — DILTIAZEM CD 120 MG CAPSULE,EXTENDED RELEASE 24 HR: ORAL | 30 days supply | Qty: 30 | Fill #1

## 2023-07-19 NOTE — Unmapped (Signed)
 Pt has 11-30 WBC in UA today, awaiting culture results. Called pt to determine if he has any UTI symptoms at this time. No answer so left detailed vm for him to return my call to discuss. Also sent mychart message.

## 2023-07-20 LAB — TACROLIMUS LEVEL: TACROLIMUS BLOOD: 8.2 ng/mL (ref 5.0–20.0)

## 2023-07-23 DIAGNOSIS — Z940 Kidney transplant status: Principal | ICD-10-CM

## 2023-07-23 DIAGNOSIS — Z796 Long term current use of immunosuppressive drug: Principal | ICD-10-CM

## 2023-07-24 NOTE — Unmapped (Signed)
 Christian Brown has been contacted in regards to their refill of ENVARSUS XR 1 mg Tb24 extended release tablet (tacrolimus). At this time, they have declined refill due to patient having 56 doses remaining. Refill assessment call date has been updated per the patient's request.

## 2023-07-24 NOTE — Unmapped (Signed)
 [facility] Pharm[facility] Refill Coordination Note    Specialty Medication(s) to be Shipped:   Infectious Disease: Biktarvy and Transplant: Envarsus 4mg     Other medication(s) to be shipped: No additional medications requested for fill at this time     Christian Brown, DOB: [birth year]  Phone: [phone] (home)       All above HIPAA information was verified with patient.     Was a Nurse, learning disability used for this call? No    Completed refill call assessment today to schedule patient's medication shipment from the [facility]  [phone]).  All relevant notes have been reviewed.     Specialty medication(s) and dose(s) confirmed: Regimen is correct and unchanged.   Changes to medications: Kairi reports no changes at this time.  Changes to insurance: No  New side effects reported not previously addressed with a pharmacist or physician: None reported  Questions for the pharmacist: No    Confirmed patient received a Conservation officer, historic buildings and a Surveyor, mining with first shipment. The patient will receive a drug information handout for each medication shipped and additional FDA Medication Guides as required.       DISEASE/MEDICATION-SPECIFIC INFORMATION        N/A    SPECIALTY MEDICATION ADHERENCE     Medication Adherence    Patient reported X missed doses in the last month: 0  Specialty Medication: BIKTARVY 50-200-25 mg tablet (bictegrav-emtricit-tenofov ala)  Patient is on additional specialty medications: Yes  Additional Specialty Medications: ENVARSUS XR 4 mg Tb24 extended release tablet (tacrolimus)  Patient Reported Additional Medication X Missed Doses in the Last Month: 0  Patient is on more than two specialty medications: No              Were doses missed due to medication being on hold? No    ENVARSUS XR 4 mg Tb24 extended release tablet (tacrolimus)  : 3 days of medicine on hand   BIKTARVY 50-200-25 mg tablet (bictegrav-emtricit-tenofov ala)  : 3 days of medicine on hand       REFERRAL TO PHARMACIST     Referral to the pharmacist: Not needed      [place]     Shipping address confirmed in Epic.     Cost and Payment: Patient has a $0 copay, payment information is not required.    Delivery Scheduled: Yes, Expected medication delivery date: 05/29.     Medication will be delivered via UPS to the prescription address in Epic WAM.    Enio Hornback   [facility]  Specialty Technician

## 2023-07-25 MED FILL — ENVARSUS XR 4 MG TABLET,EXTENDED RELEASE: ORAL | 30 days supply | Qty: 180 | Fill #2

## 2023-07-25 MED FILL — BIKTARVY 50 MG-200 MG-25 MG TABLET: ORAL | 30 days supply | Qty: 30 | Fill #1

## 2023-07-26 DIAGNOSIS — Z940 Kidney transplant status: Principal | ICD-10-CM

## 2023-07-26 LAB — MICROSCOPIC EXAMINATION
BACTERIA: NONE SEEN
CASTS: NONE SEEN /LPF
EPITHELIAL CELLS (NON RENAL): NONE SEEN /HPF (ref 0–10)
RBC URINE: NONE SEEN /HPF (ref 0–2)

## 2023-07-26 LAB — URINALYSIS WITH MICROSCOPY
BILIRUBIN UA: NEGATIVE
BLOOD UA: NEGATIVE
GLUCOSE UA: NEGATIVE
KETONES UA: NEGATIVE
NITRITE UA: NEGATIVE
PH UA: 6.5 (ref 5.0–7.5)
SPECIFIC GRAVITY UA: 1.02 (ref 1.005–1.030)
UROBILINOGEN UA: 0.2 mg/dL (ref 0.2–1.0)

## 2023-07-26 LAB — CBC W/ DIFFERENTIAL
BANDED NEUTROPHILS ABSOLUTE COUNT: 0 10*3/uL (ref 0.0–0.1)
BASOPHILS ABSOLUTE COUNT: 0 10*3/uL (ref 0.0–0.2)
BASOPHILS RELATIVE PERCENT: 1 %
EOSINOPHILS ABSOLUTE COUNT: 0.1 10*3/uL (ref 0.0–0.4)
EOSINOPHILS RELATIVE PERCENT: 1 %
HEMATOCRIT: 42.4 % (ref 37.5–51.0)
HEMOGLOBIN: 13.6 g/dL (ref 13.0–17.7)
IMMATURE GRANULOCYTES: 1 %
LYMPHOCYTES ABSOLUTE COUNT: 1 10*3/uL (ref 0.7–3.1)
LYMPHOCYTES RELATIVE PERCENT: 16 %
MEAN CORPUSCULAR HEMOGLOBIN CONC: 32.1 g/dL (ref 31.5–35.7)
MEAN CORPUSCULAR HEMOGLOBIN: 30.4 pg (ref 26.6–33.0)
MEAN CORPUSCULAR VOLUME: 95 fL (ref 79–97)
MONOCYTES ABSOLUTE COUNT: 0.7 10*3/uL (ref 0.1–0.9)
MONOCYTES RELATIVE PERCENT: 10 %
NEUTROPHILS ABSOLUTE COUNT: 4.7 10*3/uL (ref 1.4–7.0)
NEUTROPHILS RELATIVE PERCENT: 71 %
PLATELET COUNT: 209 10*3/uL (ref 150–450)
RED BLOOD CELL COUNT: 4.48 x10E6/uL (ref 4.14–5.80)
RED CELL DISTRIBUTION WIDTH: 14.4 % (ref 11.6–15.4)
WHITE BLOOD CELL COUNT: 6.6 10*3/uL (ref 3.4–10.8)

## 2023-07-27 DIAGNOSIS — Z940 Kidney transplant status: Principal | ICD-10-CM

## 2023-07-27 LAB — TACROLIMUS LEVEL: TACROLIMUS BLOOD: 15.7 ng/mL (ref 5.0–20.0)

## 2023-07-28 DIAGNOSIS — Z940 Kidney transplant status: Principal | ICD-10-CM

## 2023-07-28 LAB — MAGNESIUM: MAGNESIUM: 1.4 mg/dL — ABNORMAL LOW (ref 1.6–2.3)

## 2023-07-28 LAB — RENAL FUNCTION PANEL
ALBUMIN: 4.1 g/dL (ref 4.1–5.1)
BLOOD UREA NITROGEN: 37 mg/dL — ABNORMAL HIGH (ref 6–24)
BUN / CREAT RATIO: 12 (ref 9–20)
CALCIUM: 9.8 mg/dL (ref 8.7–10.2)
CHLORIDE: 101 mmol/L (ref 96–106)
CREATININE: 2.97 mg/dL — ABNORMAL HIGH (ref 0.76–1.27)
EGFR: 26 mL/min/{1.73_m2} — ABNORMAL LOW
GLUCOSE: 91 mg/dL (ref 70–99)
PHOSPHORUS, SERUM: 3.4 mg/dL (ref 2.8–4.1)
POTASSIUM: 4.2 mmol/L (ref 3.5–5.2)
SODIUM: 138 mmol/L (ref 134–144)

## 2023-07-30 DIAGNOSIS — Z940 Kidney transplant status: Principal | ICD-10-CM

## 2023-07-30 DIAGNOSIS — Z796 Long term current use of immunosuppressive drug: Principal | ICD-10-CM

## 2023-08-01 DIAGNOSIS — Z940 Kidney transplant status: Principal | ICD-10-CM

## 2023-08-01 LAB — CBC W/ DIFFERENTIAL
BANDED NEUTROPHILS ABSOLUTE COUNT: 0 10*3/uL (ref 0.0–0.1)
BASOPHILS ABSOLUTE COUNT: 0 10*3/uL (ref 0.0–0.2)
BASOPHILS RELATIVE PERCENT: 1 %
EOSINOPHILS ABSOLUTE COUNT: 0.1 10*3/uL (ref 0.0–0.4)
EOSINOPHILS RELATIVE PERCENT: 1 %
HEMATOCRIT: 39.9 % (ref 37.5–51.0)
HEMOGLOBIN: 12.9 g/dL — ABNORMAL LOW (ref 13.0–17.7)
IMMATURE GRANULOCYTES: 0 %
LYMPHOCYTES ABSOLUTE COUNT: 0.9 10*3/uL (ref 0.7–3.1)
LYMPHOCYTES RELATIVE PERCENT: 17 %
MEAN CORPUSCULAR HEMOGLOBIN CONC: 32.3 g/dL (ref 31.5–35.7)
MEAN CORPUSCULAR HEMOGLOBIN: 30.8 pg (ref 26.6–33.0)
MEAN CORPUSCULAR VOLUME: 95 fL (ref 79–97)
MONOCYTES ABSOLUTE COUNT: 0.5 10*3/uL (ref 0.1–0.9)
MONOCYTES RELATIVE PERCENT: 10 %
NEUTROPHILS ABSOLUTE COUNT: 3.6 10*3/uL (ref 1.4–7.0)
NEUTROPHILS RELATIVE PERCENT: 70 %
NUCLEATED RED BLOOD CELLS: 1 % — ABNORMAL HIGH (ref 0–0)
PLATELET COUNT: 204 10*3/uL (ref 150–450)
RED BLOOD CELL COUNT: 4.19 x10E6/uL (ref 4.14–5.80)
RED CELL DISTRIBUTION WIDTH: 15.3 % (ref 11.6–15.4)
WHITE BLOOD CELL COUNT: 5.1 10*3/uL (ref 3.4–10.8)

## 2023-08-01 LAB — RENAL FUNCTION PANEL
ALBUMIN: 3.9 g/dL — ABNORMAL LOW (ref 4.1–5.1)
BLOOD UREA NITROGEN: 30 mg/dL — ABNORMAL HIGH (ref 6–24)
BUN / CREAT RATIO: 13 (ref 9–20)
CALCIUM: 10.6 mg/dL — ABNORMAL HIGH (ref 8.7–10.2)
CHLORIDE: 106 mmol/L (ref 96–106)
CO2: 19 mmol/L — ABNORMAL LOW (ref 20–29)
CREATININE: 2.35 mg/dL — ABNORMAL HIGH (ref 0.76–1.27)
EGFR: 35 mL/min/{1.73_m2} — ABNORMAL LOW
GLUCOSE: 86 mg/dL (ref 70–99)
PHOSPHORUS, SERUM: 5 mg/dL — ABNORMAL HIGH (ref 2.8–4.1)
POTASSIUM: 4.6 mmol/L (ref 3.5–5.2)
SODIUM: 140 mmol/L (ref 134–144)

## 2023-08-01 LAB — URINALYSIS WITH MICROSCOPY
BILIRUBIN UA: NEGATIVE
BLOOD UA: NEGATIVE
GLUCOSE UA: NEGATIVE
KETONES UA: NEGATIVE
NITRITE UA: NEGATIVE
PH UA: 6 (ref 5.0–7.5)
SPECIFIC GRAVITY UA: 1.022 (ref 1.005–1.030)
UROBILINOGEN UA: 0.2 mg/dL (ref 0.2–1.0)

## 2023-08-01 LAB — MICROSCOPIC EXAMINATION
BACTERIA: NONE SEEN
CASTS: NONE SEEN /LPF
EPITHELIAL CELLS (NON RENAL): NONE SEEN /HPF (ref 0–10)
RBC URINE: NONE SEEN /HPF (ref 0–2)

## 2023-08-01 LAB — MAGNESIUM: MAGNESIUM: 1.4 mg/dL — ABNORMAL LOW (ref 1.6–2.3)

## 2023-08-01 NOTE — Unmapped (Signed)
 Reviewed recent Calcium level with Dr. Kotzen, pt advised to reduce his calcitriol to 0.5 mcg daily.  Pt sent MY chart message of the above and asked to please confirm if he is taking TUMS at all.   Pt requested to repeat labs next week.  Tac level still pending.

## 2023-08-02 LAB — TACROLIMUS LEVEL: TACROLIMUS BLOOD: 6.3 ng/mL (ref 5.0–20.0)

## 2023-08-06 DIAGNOSIS — Z940 Kidney transplant status: Principal | ICD-10-CM

## 2023-08-06 DIAGNOSIS — Z796 Long term current use of immunosuppressive drug: Principal | ICD-10-CM

## 2023-08-09 LAB — CBC W/ DIFFERENTIAL
BANDED NEUTROPHILS ABSOLUTE COUNT: 0 10*3/uL (ref 0.0–0.1)
BASOPHILS ABSOLUTE COUNT: 0 10*3/uL (ref 0.0–0.2)
BASOPHILS RELATIVE PERCENT: 0 %
EOSINOPHILS ABSOLUTE COUNT: 0 10*3/uL (ref 0.0–0.4)
EOSINOPHILS RELATIVE PERCENT: 1 %
HEMATOCRIT: 40.7 % (ref 37.5–51.0)
HEMOGLOBIN: 12.9 g/dL — ABNORMAL LOW (ref 13.0–17.7)
IMMATURE GRANULOCYTES: 0 %
LYMPHOCYTES ABSOLUTE COUNT: 0.8 10*3/uL (ref 0.7–3.1)
LYMPHOCYTES RELATIVE PERCENT: 17 %
MEAN CORPUSCULAR HEMOGLOBIN CONC: 31.7 g/dL (ref 31.5–35.7)
MEAN CORPUSCULAR HEMOGLOBIN: 30.4 pg (ref 26.6–33.0)
MEAN CORPUSCULAR VOLUME: 96 fL (ref 79–97)
MONOCYTES ABSOLUTE COUNT: 0.5 10*3/uL (ref 0.1–0.9)
MONOCYTES RELATIVE PERCENT: 11 %
NEUTROPHILS ABSOLUTE COUNT: 3.4 10*3/uL (ref 1.4–7.0)
NEUTROPHILS RELATIVE PERCENT: 70 %
NUCLEATED RED BLOOD CELLS: 1 % — ABNORMAL HIGH (ref 0–0)
PLATELET COUNT: 191 10*3/uL (ref 150–450)
RED BLOOD CELL COUNT: 4.25 x10E6/uL (ref 4.14–5.80)
RED CELL DISTRIBUTION WIDTH: 16 % — ABNORMAL HIGH (ref 11.6–15.4)
WHITE BLOOD CELL COUNT: 4.8 10*3/uL (ref 3.4–10.8)

## 2023-08-09 LAB — MICROSCOPIC EXAMINATION
BACTERIA: NONE SEEN
CASTS: NONE SEEN /LPF
EPITHELIAL CELLS (NON RENAL): NONE SEEN /HPF (ref 0–10)

## 2023-08-09 LAB — URINALYSIS WITH MICROSCOPY
BILIRUBIN UA: NEGATIVE
BLOOD UA: NEGATIVE
GLUCOSE UA: NEGATIVE
KETONES UA: NEGATIVE
LEUKOCYTE ESTERASE UA: NEGATIVE
NITRITE UA: NEGATIVE
PH UA: 6 (ref 5.0–7.5)
SPECIFIC GRAVITY UA: 1.024 (ref 1.005–1.030)
UROBILINOGEN UA: 1 mg/dL (ref 0.2–1.0)

## 2023-08-09 LAB — RENAL FUNCTION PANEL
ALBUMIN: 4 g/dL — ABNORMAL LOW (ref 4.1–5.1)
BLOOD UREA NITROGEN: 36 mg/dL — ABNORMAL HIGH (ref 6–24)
BUN / CREAT RATIO: 13 (ref 9–20)
CALCIUM: 9.7 mg/dL (ref 8.7–10.2)
CHLORIDE: 106 mmol/L (ref 96–106)
CO2: 19 mmol/L — ABNORMAL LOW (ref 20–29)
CREATININE: 2.7 mg/dL — ABNORMAL HIGH (ref 0.76–1.27)
EGFR: 30 mL/min/{1.73_m2} — ABNORMAL LOW
GLUCOSE: 94 mg/dL (ref 70–99)
PHOSPHORUS, SERUM: 3.8 mg/dL (ref 2.8–4.1)
POTASSIUM: 4.3 mmol/L (ref 3.5–5.2)
SODIUM: 139 mmol/L (ref 134–144)

## 2023-08-09 LAB — MAGNESIUM: MAGNESIUM: 1.5 mg/dL — ABNORMAL LOW (ref 1.6–2.3)

## 2023-08-10 LAB — TACROLIMUS LEVEL: TACROLIMUS BLOOD: 15.9 ng/mL (ref 5.0–20.0)

## 2023-08-13 DIAGNOSIS — Z940 Kidney transplant status: Principal | ICD-10-CM

## 2023-08-13 DIAGNOSIS — Z796 Long term current use of immunosuppressive drug: Principal | ICD-10-CM

## 2023-08-16 LAB — CBC W/ DIFFERENTIAL
BANDED NEUTROPHILS ABSOLUTE COUNT: 0 10*3/uL (ref 0.0–0.1)
BASOPHILS ABSOLUTE COUNT: 0 10*3/uL (ref 0.0–0.2)
BASOPHILS RELATIVE PERCENT: 0 %
EOSINOPHILS ABSOLUTE COUNT: 0.1 10*3/uL (ref 0.0–0.4)
EOSINOPHILS RELATIVE PERCENT: 3 %
HEMATOCRIT: 40.1 % (ref 37.5–51.0)
HEMOGLOBIN: 13 g/dL (ref 13.0–17.7)
IMMATURE GRANULOCYTES: 0 %
LYMPHOCYTES ABSOLUTE COUNT: 0.6 10*3/uL — ABNORMAL LOW (ref 0.7–3.1)
LYMPHOCYTES RELATIVE PERCENT: 15 %
MEAN CORPUSCULAR HEMOGLOBIN CONC: 32.4 g/dL (ref 31.5–35.7)
MEAN CORPUSCULAR HEMOGLOBIN: 31.1 pg (ref 26.6–33.0)
MEAN CORPUSCULAR VOLUME: 96 fL (ref 79–97)
MONOCYTES ABSOLUTE COUNT: 0.4 10*3/uL (ref 0.1–0.9)
MONOCYTES RELATIVE PERCENT: 11 %
NEUTROPHILS ABSOLUTE COUNT: 2.8 10*3/uL (ref 1.4–7.0)
NEUTROPHILS RELATIVE PERCENT: 70 %
NUCLEATED RED BLOOD CELLS: 2 % — ABNORMAL HIGH (ref 0–0)
PLATELET COUNT: 172 10*3/uL (ref 150–450)
RED BLOOD CELL COUNT: 4.18 x10E6/uL (ref 4.14–5.80)
RED CELL DISTRIBUTION WIDTH: 16.7 % — ABNORMAL HIGH (ref 11.6–15.4)
WHITE BLOOD CELL COUNT: 4 10*3/uL (ref 3.4–10.8)

## 2023-08-16 LAB — RENAL FUNCTION PANEL
ALBUMIN: 4.1 g/dL (ref 4.1–5.1)
BLOOD UREA NITROGEN: 28 mg/dL — ABNORMAL HIGH (ref 6–24)
BUN / CREAT RATIO: 12 (ref 9–20)
CALCIUM: 9 mg/dL (ref 8.7–10.2)
CHLORIDE: 103 mmol/L (ref 96–106)
CO2: 22 mmol/L (ref 20–29)
CREATININE: 2.39 mg/dL — ABNORMAL HIGH (ref 0.76–1.27)
EGFR: 34 mL/min/{1.73_m2} — ABNORMAL LOW
GLUCOSE: 100 mg/dL — ABNORMAL HIGH (ref 70–99)
PHOSPHORUS, SERUM: 3.3 mg/dL (ref 2.8–4.1)
POTASSIUM: 4.1 mmol/L (ref 3.5–5.2)
SODIUM: 140 mmol/L (ref 134–144)

## 2023-08-16 LAB — URINALYSIS WITH MICROSCOPY
BILIRUBIN UA: NEGATIVE
BLOOD UA: NEGATIVE
GLUCOSE UA: NEGATIVE
KETONES UA: NEGATIVE
LEUKOCYTE ESTERASE UA: NEGATIVE
NITRITE UA: NEGATIVE
PH UA: 6.5 (ref 5.0–7.5)
SPECIFIC GRAVITY UA: 1.023 (ref 1.005–1.030)
UROBILINOGEN UA: 1 mg/dL (ref 0.2–1.0)

## 2023-08-16 LAB — MICROSCOPIC EXAMINATION
BACTERIA: NONE SEEN
CASTS: NONE SEEN /LPF
EPITHELIAL CELLS (NON RENAL): NONE SEEN /HPF (ref 0–10)
RBC URINE: NONE SEEN /HPF (ref 0–2)

## 2023-08-16 LAB — MAGNESIUM: MAGNESIUM: 1.4 mg/dL — ABNORMAL LOW (ref 1.6–2.3)

## 2023-08-17 LAB — TACROLIMUS LEVEL: TACROLIMUS BLOOD: 4.8 ng/mL — ABNORMAL LOW (ref 5.0–20.0)

## 2023-08-20 DIAGNOSIS — Z940 Kidney transplant status: Principal | ICD-10-CM

## 2023-08-20 DIAGNOSIS — Z796 Long term current use of immunosuppressive drug: Principal | ICD-10-CM

## 2023-08-20 NOTE — Unmapped (Signed)
 [facility] Specialty and Home Delivery Pharmacy Refill Coordination Note    Specialty Medication(s) to be Shipped:   Infectious Disease: Biktarvy  and Transplant: Envarsus  4mg  and Prednisone  5mg     Other medication(s) to be shipped: dapsone  100 MG tablet,dilTIAZem  120 MG 24 hr capsule (CARDIZEM  CD),  VENTOLIN  HFA 90 mcg/actuation inhaler (albuterol )     Christian Brown, DOB: [birth year]  Phone: [phone] (home)       All above HIPAA information was verified with patient.     Was a Nurse, learning disability used for this call? No    Completed refill call assessment today to schedule patient's medication shipment from the [facility] and Home Delivery Pharmacy  [phone]).  All relevant notes have been reviewed.     Specialty medication(s) and dose(s) confirmed: Regimen is correct and unchanged.   Changes to medications: Christian Brown reports no changes at this time.  Changes to insurance: No  New side effects reported not previously addressed with a pharmacist or physician: None reported  Questions for the pharmacist: No    Confirmed patient received a Conservation officer, historic buildings and a Surveyor, mining with first shipment. The patient will receive a drug information handout for each medication shipped and additional FDA Medication Guides as required.       DISEASE/MEDICATION-SPECIFIC INFORMATION        N/A    SPECIALTY MEDICATION ADHERENCE     Medication Adherence    Patient reported X missed doses in the last month: 0  Specialty Medication: BIKTARVY  50-200-25 mg tablet (bictegrav-emtricit-tenofov ala)  Patient is on additional specialty medications: Yes  Additional Specialty Medications: ENVARSUS  XR 4 mg Tb24 extended release tablet (tacrolimus )  Patient Reported Additional Medication X Missed Doses in the Last Month: 0  Patient is on more than two specialty medications: Yes  Specialty Medication: predniSONE  5 MG tablet (DELTASONE )  Patient Reported Additional Medication X Missed Doses in the Last Month: 0              Were doses missed due to medication being on hold? No     BIKTARVY  50-200-25 mg tablet (bictegrav-emtricit-tenofov ala): 10 days of medicine on hand    ENVARSUS  XR 4 mg Tb24 extended release tablet (tacrolimus ): 10 days of medicine on hand    predniSONE  5 MG tablet (DELTASONE ): 10 days of medicine on hand       REFERRAL TO PHARMACIST     Referral to the pharmacist: Not needed      [facility]     Shipping address confirmed in Epic.     Cost and Payment: Patient has a $0 copay, payment information is not required.    Delivery Scheduled: Yes, Expected medication delivery date: 08/24/2023.    Medication will be delivered via UPS to the prescription address in Epic WAM.    Christian Brown   [facility] and Home Delivery Pharm[facility]  Specialty Technician

## 2023-08-23 LAB — RENAL FUNCTION PANEL
ALBUMIN: 4 g/dL — ABNORMAL LOW (ref 4.1–5.1)
BLOOD UREA NITROGEN: 32 mg/dL — ABNORMAL HIGH (ref 6–24)
BUN / CREAT RATIO: 14 (ref 9–20)
CALCIUM: 9.4 mg/dL (ref 8.7–10.2)
CHLORIDE: 103 mmol/L (ref 96–106)
CO2: 17 mmol/L — ABNORMAL LOW (ref 20–29)
CREATININE: 2.36 mg/dL — ABNORMAL HIGH (ref 0.76–1.27)
EGFR: 35 mL/min/{1.73_m2} — ABNORMAL LOW
GLUCOSE: 123 mg/dL — ABNORMAL HIGH (ref 70–99)
PHOSPHORUS, SERUM: 3.4 mg/dL (ref 2.8–4.1)
POTASSIUM: 4.6 mmol/L (ref 3.5–5.2)
SODIUM: 138 mmol/L (ref 134–144)

## 2023-08-23 LAB — URINALYSIS WITH MICROSCOPY
BILIRUBIN UA: NEGATIVE
BLOOD UA: NEGATIVE
GLUCOSE UA: NEGATIVE
KETONES UA: NEGATIVE
NITRITE UA: NEGATIVE
PH UA: 6 (ref 5.0–7.5)
SPECIFIC GRAVITY UA: >=1.03 — AB (ref 1.005–1.030)
UROBILINOGEN UA: 1 mg/dL (ref 0.2–1.0)

## 2023-08-23 LAB — MICROSCOPIC EXAMINATION
BACTERIA: NONE SEEN
CASTS: NONE SEEN /LPF
EPITHELIAL CELLS (NON RENAL): NONE SEEN /HPF (ref 0–10)
RBC URINE: NONE SEEN /HPF (ref 0–2)

## 2023-08-23 LAB — CBC W/ DIFFERENTIAL
BANDED NEUTROPHILS ABSOLUTE COUNT: 0 10*3/uL (ref 0.0–0.1)
BASOPHILS ABSOLUTE COUNT: 0 10*3/uL (ref 0.0–0.2)
BASOPHILS RELATIVE PERCENT: 0 %
EOSINOPHILS ABSOLUTE COUNT: 0 10*3/uL (ref 0.0–0.4)
EOSINOPHILS RELATIVE PERCENT: 1 %
HEMATOCRIT: 41.7 % (ref 37.5–51.0)
HEMOGLOBIN: 13.3 g/dL (ref 13.0–17.7)
IMMATURE GRANULOCYTES: 0 %
LYMPHOCYTES ABSOLUTE COUNT: 0.7 10*3/uL (ref 0.7–3.1)
LYMPHOCYTES RELATIVE PERCENT: 14 %
MEAN CORPUSCULAR HEMOGLOBIN CONC: 31.9 g/dL (ref 31.5–35.7)
MEAN CORPUSCULAR HEMOGLOBIN: 31.2 pg (ref 26.6–33.0)
MEAN CORPUSCULAR VOLUME: 98 fL — ABNORMAL HIGH (ref 79–97)
MONOCYTES ABSOLUTE COUNT: 0.4 10*3/uL (ref 0.1–0.9)
MONOCYTES RELATIVE PERCENT: 8 %
NEUTROPHILS ABSOLUTE COUNT: 3.9 10*3/uL (ref 1.4–7.0)
NEUTROPHILS RELATIVE PERCENT: 76 %
PLATELET COUNT: 201 10*3/uL (ref 150–450)
RED BLOOD CELL COUNT: 4.26 x10E6/uL (ref 4.14–5.80)
RED CELL DISTRIBUTION WIDTH: 17.2 % — ABNORMAL HIGH (ref 11.6–15.4)
WHITE BLOOD CELL COUNT: 5.2 10*3/uL (ref 3.4–10.8)

## 2023-08-23 LAB — MAGNESIUM: MAGNESIUM: 1.6 mg/dL (ref 1.6–2.3)

## 2023-08-23 MED FILL — BIKTARVY 50 MG-200 MG-25 MG TABLET: ORAL | 30 days supply | Qty: 30 | Fill #2

## 2023-08-23 MED FILL — ENVARSUS XR 4 MG TABLET,EXTENDED RELEASE: ORAL | 30 days supply | Qty: 180 | Fill #3

## 2023-08-23 MED FILL — DAPSONE 100 MG TABLET: ORAL | 30 days supply | Qty: 30 | Fill #1

## 2023-08-23 MED FILL — DILTIAZEM CD 120 MG CAPSULE,EXTENDED RELEASE 24 HR: ORAL | 30 days supply | Qty: 30 | Fill #2

## 2023-08-23 MED FILL — VENTOLIN HFA 90 MCG/ACTUATION AEROSOL INHALER: RESPIRATORY_TRACT | 17 days supply | Qty: 18 | Fill #8

## 2023-08-23 MED FILL — PREDNISONE 5 MG TABLET: ORAL | 90 days supply | Qty: 90 | Fill #1

## 2023-08-24 LAB — TACROLIMUS LEVEL: TACROLIMUS BLOOD: 17.7 ng/mL (ref 5.0–20.0)

## 2023-08-27 DIAGNOSIS — Z940 Kidney transplant status: Principal | ICD-10-CM

## 2023-08-27 DIAGNOSIS — Z796 Long term current use of immunosuppressive drug: Principal | ICD-10-CM

## 2023-09-03 DIAGNOSIS — Z796 Long term current use of immunosuppressive drug: Principal | ICD-10-CM

## 2023-09-03 DIAGNOSIS — Z940 Kidney transplant status: Principal | ICD-10-CM

## 2023-09-10 DIAGNOSIS — Z796 Long term current use of immunosuppressive drug: Principal | ICD-10-CM

## 2023-09-10 DIAGNOSIS — Z940 Kidney transplant status: Principal | ICD-10-CM

## 2023-09-13 LAB — CBC W/ DIFFERENTIAL
BANDED NEUTROPHILS ABSOLUTE COUNT: 0 x10E3/uL (ref 0.0–0.1)
BASOPHILS ABSOLUTE COUNT: 0 x10E3/uL (ref 0.0–0.2)
BASOPHILS RELATIVE PERCENT: 1 %
EOSINOPHILS ABSOLUTE COUNT: 0 x10E3/uL (ref 0.0–0.4)
EOSINOPHILS RELATIVE PERCENT: 1 %
HEMATOCRIT: 43.4 % (ref 37.5–51.0)
HEMOGLOBIN: 14 g/dL (ref 13.0–17.7)
IMMATURE GRANULOCYTES: 0 %
LYMPHOCYTES ABSOLUTE COUNT: 0.7 x10E3/uL (ref 0.7–3.1)
LYMPHOCYTES RELATIVE PERCENT: 21 %
MEAN CORPUSCULAR HEMOGLOBIN CONC: 32.3 g/dL (ref 31.5–35.7)
MEAN CORPUSCULAR HEMOGLOBIN: 32.7 pg (ref 26.6–33.0)
MEAN CORPUSCULAR VOLUME: 101 fL — ABNORMAL HIGH (ref 79–97)
MONOCYTES ABSOLUTE COUNT: 0.5 x10E3/uL (ref 0.1–0.9)
MONOCYTES RELATIVE PERCENT: 14 %
NEUTROPHILS ABSOLUTE COUNT: 2.2 x10E3/uL (ref 1.4–7.0)
NEUTROPHILS RELATIVE PERCENT: 63 %
PLATELET COUNT: 183 x10E3/uL (ref 150–450)
RED BLOOD CELL COUNT: 4.28 x10E6/uL (ref 4.14–5.80)
RED CELL DISTRIBUTION WIDTH: 16.2 % — ABNORMAL HIGH (ref 11.6–15.4)
WHITE BLOOD CELL COUNT: 3.5 x10E3/uL (ref 3.4–10.8)

## 2023-09-14 LAB — RENAL FUNCTION PANEL
ALBUMIN: 4.3 g/dL (ref 4.1–5.1)
BLOOD UREA NITROGEN: 24 mg/dL (ref 6–24)
BUN / CREAT RATIO: 11 (ref 9–20)
CALCIUM: 9.2 mg/dL (ref 8.7–10.2)
CHLORIDE: 103 mmol/L (ref 96–106)
CO2: 18 mmol/L — ABNORMAL LOW (ref 20–29)
CREATININE: 2.13 mg/dL — ABNORMAL HIGH (ref 0.76–1.27)
EGFR: 39 mL/min/1.73 — ABNORMAL LOW
GLUCOSE: 86 mg/dL (ref 70–99)
PHOSPHORUS, SERUM: 3.9 mg/dL (ref 2.8–4.1)
POTASSIUM: 5.6 mmol/L — ABNORMAL HIGH (ref 3.5–5.2)
SODIUM: 138 mmol/L (ref 134–144)

## 2023-09-14 LAB — URINALYSIS WITH MICROSCOPY
BILIRUBIN UA: NEGATIVE
BLOOD UA: NEGATIVE
GLUCOSE UA: NEGATIVE
KETONES UA: NEGATIVE
LEUKOCYTE ESTERASE UA: NEGATIVE
NITRITE UA: NEGATIVE
PH UA: 6 (ref 5.0–7.5)
SPECIFIC GRAVITY UA: 1.022 (ref 1.005–1.030)
UROBILINOGEN UA: 0.2 mg/dL (ref 0.2–1.0)

## 2023-09-14 LAB — MICROSCOPIC EXAMINATION
BACTERIA: NONE SEEN
CASTS: NONE SEEN /LPF
EPITHELIAL CELLS (NON RENAL): NONE SEEN /HPF (ref 0–10)
RBC URINE: NONE SEEN /HPF (ref 0–2)

## 2023-09-14 LAB — MAGNESIUM: MAGNESIUM: 1.6 mg/dL (ref 1.6–2.3)

## 2023-09-15 LAB — TACROLIMUS LEVEL: TACROLIMUS BLOOD: 8.8 ng/mL (ref 5.0–20.0)

## 2023-09-17 DIAGNOSIS — Z940 Kidney transplant status: Principal | ICD-10-CM

## 2023-09-17 DIAGNOSIS — Z796 Long term current use of immunosuppressive drug: Principal | ICD-10-CM

## 2023-09-18 NOTE — Unmapped (Signed)
 [name] Special[facility] and Home Delivery Pharmacy Refill Coordination Note    Specialty Medication(s) to be Shipped:   Infectious Disease: Biktarvy  and Transplant: tacrolimus  4mg  and tacrolimus  1mg     Other medication(s) to be shipped: dapsone , Ventolin , diltiazem , calcitriol , Vit D, azathioprine      Christian Brown, DOB: 1980-11-09  Phone: [phone] (home)       All above HIPAA information was verified with patient.     Was a Nurse, learning disability used for this call? No    Completed refill call assessment today to schedule patient's medication shipment from the [facility] and Home Delivery Pharmacy  [phone]).  All relevant notes have been reviewed.     Specialty medication(s) and dose(s) confirmed: Patient reports changes to the regimen as follows: take 12 mg twice daily   Changes to medications: Christian Brown reports no changes at this time.  Changes to insurance: No  New side effects reported not previously addressed with a pharmacist or physician: None reported  Questions for the pharmacist: No    Confirmed patient received a Conservation officer, historic buildings and a Surveyor, mining with first shipment. The patient will receive a drug information handout for each medication shipped and additional FDA Medication Guides as required.       DISEASE/MEDICATION-SPECIFIC INFORMATION        N/A    SPECIALTY MEDICATION ADHERENCE     Medication Adherence    Patient reported X missed doses in the last month: 0  Specialty Medication: bictegrav-emtricit-tenofov ala: BIKTARVY  50-200-25 mg tablet  Patient is on additional specialty medications: Yes  Additional Specialty Medications: tacrolimus : ENVARSUS  XR 4 mg Tb24 extended release tablet  Patient Reported Additional Medication X Missed Doses in the Last Month: 0  Patient is on more than two specialty medications: Yes  Specialty Medication: tacrolimus : ENVARSUS  XR 1 mg Tb24 extended release tablet  Any gaps in refill history greater than 2 weeks in the last 3 months: no  Demonstrates understanding of importance of adherence: yes  Informant: patient  Confirmed plan for next specialty medication refill: delivery by pharmacy  Refills needed for supportive medications: not needed          Refill Coordination    Has the Patients' Contact Information Changed: No  Is the Shipping Address Different: No         Were doses missed due to medication being on hold? No    tacrolimus : ENVARSUS  XR 1 mg: 3 days of medicine on hand   tacrolimus : ENVARSUS  XR 4  mg: 2 days of medicine on hand     REFERRAL TO PHARMACIST     Referral to the pharmacist: Not needed      [facility]     Shipping address confirmed in Epic.     Cost and Payment: Patient has a copay of $4. They are aware and have authorized the pharmacy to charge the credit card on file.    Delivery Scheduled: Yes, Expected medication delivery date: 09/21/23.     Medication will be delivered via UPS to the prescription address in Epic WAM.    [name]   [facility] Specialty and Home Delivery Pharmacy  Specialty Technician

## 2023-09-20 DIAGNOSIS — Z940 Kidney transplant status: Principal | ICD-10-CM

## 2023-09-20 MED FILL — DILTIAZEM CD 120 MG CAPSULE,EXTENDED RELEASE 24 HR: ORAL | 30 days supply | Qty: 30 | Fill #3

## 2023-09-20 MED FILL — CALCITRIOL 0.5 MCG CAPSULE: ORAL | 30 days supply | Qty: 120 | Fill #1

## 2023-09-20 MED FILL — DAPSONE 100 MG TABLET: ORAL | 30 days supply | Qty: 30 | Fill #1

## 2023-09-20 MED FILL — ERGOCALCIFEROL (VITAMIN D2) 1,250 MCG (50,000 UNIT) CAPSULE: ORAL | 84 days supply | Qty: 12 | Fill #1

## 2023-09-20 MED FILL — AZATHIOPRINE 100 MG TABLET: ORAL | 90 days supply | Qty: 90 | Fill #2

## 2023-09-20 MED FILL — BIKTARVY 50 MG-200 MG-25 MG TABLET: ORAL | 30 days supply | Qty: 30 | Fill #3

## 2023-09-20 MED FILL — ENVARSUS XR 1 MG TABLET,EXTENDED RELEASE: ORAL | 30 days supply | Qty: 180 | Fill #2

## 2023-09-20 MED FILL — VENTOLIN HFA 90 MCG/ACTUATION AEROSOL INHALER: RESPIRATORY_TRACT | 17 days supply | Qty: 18 | Fill #9

## 2023-09-20 MED FILL — ENVARSUS XR 4 MG TABLET,EXTENDED RELEASE: ORAL | 30 days supply | Qty: 180 | Fill #4

## 2023-09-20 NOTE — Unmapped (Signed)
 UNOS form

## 2023-09-21 DIAGNOSIS — Z940 Kidney transplant status: Principal | ICD-10-CM

## 2023-09-24 DIAGNOSIS — Z940 Kidney transplant status: Principal | ICD-10-CM

## 2023-09-24 DIAGNOSIS — Z796 Long term current use of immunosuppressive drug: Principal | ICD-10-CM

## 2023-09-27 DIAGNOSIS — E119 Type 2 diabetes mellitus without complications: Principal | ICD-10-CM

## 2023-09-27 DIAGNOSIS — Z940 Kidney transplant status: Principal | ICD-10-CM

## 2023-09-27 DIAGNOSIS — Z79899 Other long term (current) drug therapy: Principal | ICD-10-CM

## 2023-09-27 DIAGNOSIS — N184 Chronic kidney disease, stage 4 (severe): Principal | ICD-10-CM

## 2023-10-01 DIAGNOSIS — Z940 Kidney transplant status: Principal | ICD-10-CM

## 2023-10-01 DIAGNOSIS — Z796 Long term current use of immunosuppressive drug: Principal | ICD-10-CM

## 2023-10-04 DIAGNOSIS — Z940 Kidney transplant status: Principal | ICD-10-CM

## 2023-10-04 NOTE — Unmapped (Signed)
 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   Native kidney disease collapsing FSGS/HIVAN  Graft function: creatinine 1.7-2.1  DSAs: Had a Class 2 DSA (DQ) but MFI improved  Biomarkers for early detection of rejection:  Lab Results   Component Value Date    ALLOSURE 0.11 06/22/2023    ALLOSURE 0.11 06/22/2023   - plan Allosure q6 months due to modified immunosuppression  Post-surgical issues:  - aspirin  for 1 year post-op  Dialysis access: LUE AVF with no bruit/thrill as of the 03/28/23 visit (no plan for revascularization)    Biopsy 03/06/23 showed prominent calcium  phosphate deposits, with some possible borderline TCMR, IF 30%, TA 20-25%. Treated with IV steroids.     Nonobstructing small calculus on April 2025 US   - advised robust hydration  - 24-hr urine ([facility]) kit      # Immunosuppression  Tacrolimus : Envarsus  reduce to 12 mg AM, 8 mg PM, goal trough 6-8 ng/mL   - using diltiazem  to boost since he is a rapid metabolizer  Azathioprine  previously reduced Jan 2025 to 100 mg daily, potential to boost dose to 150 but currently everything reassuring  Prednisone  5 mg daily (indicated due to HIVAN)  Initially MMF but did not tolerate due to gastritis/esophagitis; he may benefit from trial of Myfortic  in future due to his elevated rejection risk    # Acute issues today  none    # BP management   Goal 130/80, as tolerated.  - diltiazem  120 mg daily (dual indication to boost tacrolimus )   - minoxidil  2.5 mg daily  For future:  Home BP monitoring; possibility to add hydrochlorothiazide  12.5 mg daily, for lower extremity edema.   Intolerances to amlodipine/nifedipine and hydralazine , hives to metoprolol and labetalol.    # Infectious disease  CMV: D-/R+ EBV: D+/R+   No viremias  Follows with ID for his HIV, on Biktarvy     # Cardiovascular: primary prevention  The 10-year ASCVD risk score (Arnett DK, et al., 2019) is: 1.7%  Pre-txp studies show no HF, but show calcification around mitral vavle, and severe LVS.  - will schedule a stress secho to follw     # CKD-BMD  He has hungry bone syndrome, much improved. He is s/p repeat parathyroidectomy 03/14/23 (prior parathyroidetomy April 2021).   - calcitriol  - reduce to 1 mcg BID  - for VitD level 11: s/p ergocalciferol  50,000 units weekly x12 weeks, started 05/14/23. Start cholecalciferol  50 mcg daily after finises ergo  - Tums - he is now OFF   - Magnesium : 2 gummies BID   Lab Results   Component Value Date    PTH 182.3 (H) 04/10/2023    VITDTOTAL 11.9 (L) 03/02/2023    CALCIUM  9.2 09/13/2023    PHOS 4.1 06/22/2023         # Immunizations  Immunization History   Administered Date(s) Administered    COVID-19 VAC,BIVALENT,MODERNA(BLUE CAP) 10/10/2021    COVID-19 VACCINE,MRNA(MODERNA)(PF) 09/28/2019, 10/26/2019    HEPATITIS B VACCINE ADULT, ADJUVANTED, IM(HEPLISAV B) 01/16/2020, 03/18/2020, 05/12/2020    HEPATITIS B VACCINE ADULT,IM(ENERGIX B, RECOMBIVAX) 03/30/2015, 01/27/2016    Hepatitis A (Adult) 09/04/2016, 04/26/2017, 10/10/2021    Hepatitis A Vaccine - Unspecified Formulation 09/04/2016, 04/26/2017    Hepatitis B Vaccine, Unspecified Formulation  12/04/1994, 01/09/1995, 06/12/1995    INFLUENZA TIV (TRI) 88MO+ W/ PRESERV (IM) 11/20/2013, 11/11/2014, 03/15/2016, 11/27/2017, 12/11/2018    INFLUENZA TIV (TRI) PF (IM)(HISTORICAL) 03/15/2016    INFLUENZA VACCINE IIV3(IM)(PF)6 MOS UP 12/14/2022    Influenza Vaccine PF(Quad)(Egg Free)18+(Flublok) 12/09/2021    Influenza Vaccine Quad (IIV4 W/PRESERV) 88MO+ 11/01/2016, 11/26/2018    Influenza Vaccine Quad(IM)6 MO-Adult(PF) 11/20/2013, 11/11/2014, 11/29/2020    Influenza Virus Vaccine, unspecified formulation 11/27/2017, 12/11/2018, 12/12/2019, 11/27/2020    MENINGOCOCCAL VACCINE, A,C,Y, W-135(IM)(MENVEO) 07/10/2016, 03/22/2017 MMR 01/09/1995    Meningococcal Conjugate MCV4P 07/10/2016, 03/22/2017    PNEUMOCOCCAL POLYSACCHARIDE 23-VALENT 11/20/2013, 11/20/2013, 11/26/2018, 11/26/2018    Pneumococcal Conjugate 13-Valent 08/28/2017, 08/28/2017, 12/12/2019    TD(TDVAX),ADSORBED,2LF(IM)(PF) 06/12/1995    TdaP 03/08/2018, 03/08/2018       # Cancer screening  Colonoscopy: start age 89  Skin: recommend yearly dermatology evaluation      Kidney Transplant History:   Date of Transplant: 09/05/2022 (Kidney)  Type of Transplant: DDKT  KDPI: 18%  Cold ischemic time: 972 minutes  Warm ischemic time: 40 minutes  cPRA: 0-7%  HLA match: 4/6  Blood type: Donor O, Recipient O POS  ID: CMV: D-/R+ EBV: D+/R+, HCV donor serologies not immediately available (requesting this info)  Native Kidney Disease: collapsing FSGS/HIVAN (biopsy proven)    Native kidney biopsy: yes   Pre-transplant dialysis course: hemodialysis since 02/11/2015   Post-Transplant Course:    Delayed graft function requiring dialysis: no   Other complications: readmitted 7/31-8/2 for nausea/vomiting  Prior Transplants: None  Induction: thymoglobulin  Early steroid withdrawal: No (due to HIVAN)    Biopsies:   Zero-Hour Biopsy: none    History of Presenting Illness:     Since the last visit:  History of Present Illness  Christian Brown is a 43 year old male with a history of kidney transplant who presents for a nephrology follow-up.    Renal allograft function  - Kidney transplant recipient with creatinine levels ranging from 1.9 to 2.1 mg/dL  - No current pain over the transplanted kidney  - Imaging in April revealed a small non-obstructing kidney stone in the transplanted kidney    Immunosuppressive therapy  - Tacrolimus  dosed at 12 mg in the morning and 8 mg in the evening  - Missed a morning dose due to travel but took it before his blood draw, potentially affecting tacrolimus  levels    Blood pressure management  - Blood pressure is slightly elevated  - Takes diltiazem  120 mg in the morning and minoxidil  2.5 mg  - Unable to monitor blood pressure at home due to a faulty machine; plans to obtain a new monitor    Bone mineral metabolism  - Completed ergocalciferol  and has a new supply  - Takes calcitriol  0.5 mcg twice daily, planning to reduce to once daily    Vascular access symptoms  - Past pain and numbness in the arm related to his fistula, which has improved        Concerns about nonadherence: No    Social: lives in [place]. 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     Review of Systems:   A 12-system review was negative except as documented in the HPI.    Physical Exam:     There were no vitals taken for this visit.  Constitutional:  Well-appearing in NAD  Dialysis access: LUE AVF forearm - no bruit or thrill as of the 03/28/23 visit  Physical Exam  CHEST: Clear to auscultation bilaterally.  CARDIOVASCULAR: Normal heart sounds.  ABDOMEN: Transplanted kidney non-tender.  EXTREMITIES: No ankle swelling.          Allergies:   Allergies   Allergen Reactions    Amlodipine Hives, Palpitations, Rash and Other (See Comments)     Causes fatigue      Doxercalciferol Hives    Fish Containing Products Hives and Shortness Of Breath     Please do not serve fish for meals. Highly allergic         Iron Sucrose Hives    Labetalol Itching and Rash    Metoclopramide Hives and Other (See Comments)     Causes ticks/ jerks    Metoprolol Hives    Nifedipine Hives, Itching and Shortness Of Breath    Other Swelling     Suture material, localized swelling at closure site    Sulfamethoxazole-Trimethoprim Rash and Other (See Comments)     Renal failure      Vancomycin Hives and Other (See Comments)     Patient states vancomycin caused kidney injury    Calcitonin     Peanut Oil Other (See Comments)    Potassium Clavulanate     Azathioprine  Itching and Rash    Chlorhexidine Itching     Pre-op wipes only    Clindamycin Other (See Comments) and Palpitations     Chest pain    Hydralazine  Itching and Dizziness           Sensipar [Cinacalcet] Hives        Current Medications:   Current Medications[1]      Past Medical History:   1. 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 HIV positive, diagnosed in 10/2013, has been treated and followed by Dr. Fleeta Rothman of [facility] in [place]. Last CD4 count was 282 with HIV RNA <20 on 12/02/2019. Past infections include a fulminant muscle abscess with surgical drainage and PCP pneumonia in 2016, denies any recent infections related to his HIV.   3.  Kidney biopsy in 2015 showed collapsing FSGS and HIV-associated nephropathy.   4.  Past history of DVT in 2015, was on Eliquis. No longer on anticoagulant therapy.   5.  Asthma with recent abnormal PFTs (in 01/2020) showing moderate to severe restrictive defects.   6. Cardiac testing: Echo in 04/2019 showed EF of 60-65%, moderate to severe left ventricular hypertrophy, and mitral stenosis with unclear severity. Stress test (PET CT myocardial perfusion study) in 05/2019 with no significant ischemia, EF of 55%, and no significant coronary calcifications noted.   7.  History of pericardial effusion, had pericardial window, 02/2019.   8.  Hypertension for past 5 years.  9.  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   10.  Sleep apnea diagnosed, never received CPAP equipment due to supply issue.  11. Hyperparathyroidism, S/P parathyroidectomy, 05/2019.  12. Hx of recent COVID-19 infection.   13. Hx of shingles.  14. Hx of genital warts.  15. Hx of exploratory lap with bowel resection, ileocectomy, 2017.  16. Hx of right femur fracture with surgery, 2003.      Laboratory studies:   Reviewed recent results.  Results  LABS  Creatinine: 2.1 (08/2023)  Tacrolimus : 8 (09/13/2023)    RADIOLOGY  Kidney Ultrasound: Normal transplanted kidney length, tiny non-obstructing kidney stone, no mass in native kidneys (06/22/2023)    DIAGNOSTIC  Echocardiogram: Left ventricular hypertrophy, hyperdynamic systolic function, moderate mitral stenosis (02/2023)             [  1]   Current Outpatient Medications   Medication Sig Dispense Refill    albuterol  HFA 90 mcg/actuation inhaler Inhale 1-2 puffs every four (4) to six (6) hours as needed for wheezing or shortness of breath. 18 g 11    azathioprine  (AZASAN ) 100 mg tablet Take 1 tablet (100 mg total) by mouth daily. 90 tablet 3    bictegrav-emtricit-tenofov ala (BIKTARVY ) 50-200-25 mg tablet Take 1 tablet by mouth every morning. 30 tablet 11    bictegrav-emtricit-tenofov ala (BIKTARVY ) 50-200-25 mg tablet Take 1 tablet by mouth daily. 30 tablet 11    calcitriol  (ROCALTROL ) 0.5 MCG capsule Take 2 capsules (1 mcg total) by mouth two (2) times a day. 120 capsule 11    dapsone  100 MG tablet Take 1 tablet (100 mg total) by mouth daily. 30 tablet 5    dapsone  100 MG tablet Take 1 tablet (100 mg total) by mouth daily. 30 tablet 5    DIETARY SUPPLEMENT,MISC COMB14 ORAL Taking magnesium  gummy supplement once daily. Dosage strength & product unknown      dilTIAZem  (CARDIZEM  CD) 120 MG 24 hr capsule Take 1 capsule (120 mg total) by mouth daily. 30 capsule 5    empty container Misc Use as directed with Zarxio  (Patient not taking: Reported on 03/21/2023) 1 each 0    ergocalciferol -1,250 mcg, 50,000 unit, (DRISDOL ) 1,250 mcg (50,000 unit) capsule Take 1 capsule (1,250 mcg total) by mouth once a week. 12 capsule 1    filgrastim -sndz (ZARXIO ) 480 mcg/0.8 mL Syrg Inject 0.8 mL (480 mcg total) under the skin once a week. Administer only as directed by transplant team. (Patient not taking: Reported on 03/21/2023) 3.2 mL 1    HYDROcodone-acetaminophen  (NORCO) 5-325 mg per tablet Take 1 tablet by mouth daily as needed for pain.      minoxidil  (LONITEN ) 2.5 MG tablet Take 1 tablet (2.5 mg total) by mouth daily. 90 tablet 3    pantoprazole  (PROTONIX ) 40 MG tablet Take 1 tablet (40 mg total) by mouth daily as needed. 90 tablet 1    predniSONE  (DELTASONE ) 5 MG tablet Take 1 tablet (5 mg total) by mouth in the morning. 90 tablet 3    tacrolimus  (ENVARSUS  XR) 1 mg Tb24 extended release tablet Take 3 tablets (3 mg total) by mouth two (2) times a day. Take in addition to 4 mg tablets for a total dose of 15 mg twice daily 540 tablet 3    tacrolimus  (ENVARSUS  XR) 4 mg Tb24 extended release tablet Take 3 tablets (12 mg total) by mouth two (2) times a day. Take in addition to 1 mg tablets for a total dose of 15 mg bid 540 tablet 3     No current facility-administered medications for this visit.

## 2023-10-05 ENCOUNTER — Other Ambulatory Visit: Admit: 2023-10-05 | Discharge: 2023-10-06 | Payer: MEDICARE

## 2023-10-05 ENCOUNTER — Ambulatory Visit: Admit: 2023-10-05 | Discharge: 2023-10-06 | Payer: MEDICARE

## 2023-10-05 DIAGNOSIS — I517 Cardiomegaly: Principal | ICD-10-CM

## 2023-10-05 DIAGNOSIS — Z940 Kidney transplant status: Principal | ICD-10-CM

## 2023-10-05 DIAGNOSIS — I151 Hypertension secondary to other renal disorders: Principal | ICD-10-CM

## 2023-10-05 DIAGNOSIS — I050 Rheumatic mitral stenosis: Principal | ICD-10-CM

## 2023-10-05 DIAGNOSIS — Z796 Long term current use of immunosuppressive drug: Principal | ICD-10-CM

## 2023-10-05 DIAGNOSIS — N184 Chronic kidney disease, stage 4 (severe): Principal | ICD-10-CM

## 2023-10-05 DIAGNOSIS — N200 Calculus of kidney: Principal | ICD-10-CM

## 2023-10-05 DIAGNOSIS — Z79899 Other long term (current) drug therapy: Principal | ICD-10-CM

## 2023-10-05 LAB — LIPID PANEL
CHOLESTEROL: 129 mg/dL (ref <200)
HDL CHOLESTEROL: 41 mg/dL (ref >40)
LDL CHOLESTEROL CALCULATED: 75 mg/dL (ref <100)
NON-HDL CHOLESTEROL: 88 mg/dL (ref <130)
TRIGLYCERIDES: 90 mg/dL (ref <150)

## 2023-10-05 LAB — FERRITIN: FERRITIN: 15.5 ng/mL (ref 10.5–307.3)

## 2023-10-05 LAB — RENAL FUNCTION PANEL
ALBUMIN: 3.5 g/dL (ref 3.4–5.0)
ANION GAP: 8 mmol/L (ref 5–14)
BLOOD UREA NITROGEN: 20 mg/dL (ref 9–23)
BUN / CREAT RATIO: 9
CALCIUM: 9.9 mg/dL (ref 8.7–10.4)
CHLORIDE: 106 mmol/L (ref 98–107)
CO2: 25.5 mmol/L (ref 20.0–31.0)
CREATININE: 2.14 mg/dL — ABNORMAL HIGH (ref 0.73–1.18)
EGFR CKD-EPI (2021) MALE: 39 mL/min/1.73m2 — ABNORMAL LOW (ref >=60)
GLUCOSE RANDOM: 109 mg/dL (ref 70–179)
PHOSPHORUS: 3.1 mg/dL (ref 2.4–5.1)
POTASSIUM: 4.5 mmol/L (ref 3.4–4.8)
SODIUM: 139 mmol/L (ref 135–145)

## 2023-10-05 LAB — CBC W/ AUTO DIFF
BASOPHILS ABSOLUTE COUNT: 0 10*9/L (ref 0.0–0.1)
BASOPHILS RELATIVE PERCENT: 0.5 %
EOSINOPHILS ABSOLUTE COUNT: 0 10*9/L (ref 0.0–0.5)
EOSINOPHILS RELATIVE PERCENT: 0.6 %
HEMATOCRIT: 41.5 % (ref 39.0–48.0)
HEMOGLOBIN: 13.9 g/dL (ref 12.9–16.5)
LYMPHOCYTES ABSOLUTE COUNT: 0.7 10*9/L — ABNORMAL LOW (ref 1.1–3.6)
LYMPHOCYTES RELATIVE PERCENT: 13.8 %
MEAN CORPUSCULAR HEMOGLOBIN CONC: 33.6 g/dL (ref 32.0–36.0)
MEAN CORPUSCULAR HEMOGLOBIN: 32.2 pg (ref 25.9–32.4)
MEAN CORPUSCULAR VOLUME: 95.8 fL — ABNORMAL HIGH (ref 77.6–95.7)
MEAN PLATELET VOLUME: 8.5 fL (ref 6.8–10.7)
MONOCYTES ABSOLUTE COUNT: 0.4 10*9/L (ref 0.3–0.8)
MONOCYTES RELATIVE PERCENT: 8.7 %
NEUTROPHILS ABSOLUTE COUNT: 3.7 10*9/L (ref 1.8–7.8)
NEUTROPHILS RELATIVE PERCENT: 76.4 %
PLATELET COUNT: 225 10*9/L (ref 150–450)
RED BLOOD CELL COUNT: 4.33 10*12/L (ref 4.26–5.60)
RED CELL DISTRIBUTION WIDTH: 17.4 % — ABNORMAL HIGH (ref 12.2–15.2)
WBC ADJUSTED: 4.9 10*9/L (ref 3.6–11.2)

## 2023-10-05 LAB — IRON & TIBC
IRON SATURATION: 14 % — ABNORMAL LOW (ref 20–55)
IRON: 45 ug/dL — ABNORMAL LOW (ref 65–175)
TOTAL IRON BINDING CAPACITY: 329 ug/dL (ref 250–425)

## 2023-10-05 LAB — BK VIRUS QUANTITATIVE PCR, BLOOD: BK BLOOD RESULT: NOT DETECTED

## 2023-10-05 LAB — CMV DNA, QUANTITATIVE, PCR: CMV VIRAL LD: NOT DETECTED

## 2023-10-05 LAB — MAGNESIUM: MAGNESIUM: 1.6 mg/dL (ref 1.6–2.6)

## 2023-10-05 LAB — PARATHYROID HORMONE (PTH): PARATHYROID HORMONE INTACT: 101.9 pg/mL — ABNORMAL HIGH (ref 18.5–88.1)

## 2023-10-05 MED ORDER — CALCITRIOL 0.5 MCG CAPSULE
ORAL_CAPSULE | Freq: Every day | ORAL | 3 refills | 90.00000 days | Status: CP
Start: 2023-10-05 — End: 2024-10-04

## 2023-10-05 NOTE — Unmapped (Signed)
 VISIT SUMMARY:  Today, you had a follow-up visit to check on your kidney transplant and overall health. Your kidney function is stable, and we discussed your medications, blood pressure, and other health concerns.    YOUR PLAN:  KIDNEY TRANSPLANT STATUS: Your kidney function is stable with creatinine levels in the low 2s. There are no signs of rejection.  -Reduce tacrolimus  to 12 mg in the morning and 8 mg in the evening.  -Continue taking azathioprine  100 mg daily and prednisone  5 mg daily.  -We will monitor your tacrolimus  levels at your next lab visit.    HYPERTENSION: Your blood pressure is elevated today  -Obtain a new home blood pressure monitor.  -Monitor and report your home blood pressure readings. Goal 130/90 at home when resting.  -We may adjust your diltiazem  dosage if your blood pressure remains high.    KIDNEY STONE: You have a small, non-obstructing kidney stone in your transplanted kidney.  -Increase your fluid intake to help prevent more stones.Minimum 70 oz daily water.  -We will order a 24-hour urine collection kit for you from [facility].  -Make sure you are well-hydrated before the 24-hour urine study.    DISCONTINUED ARTERIOVENOUS FISTULA: You have had intermittent pain and numbness in your hand related to your discontinued fistula.  -Monitor your symptoms. If they persist, we may refer you to Dr. Aundria.    LEFT VENTRICULAR HYPERTROPHY AND MITRAL VALVE CALCIFICATION: You have left ventricular hypertrophy and mitral valve calcification, but no symptoms.  -We will arrange for non-urgent stress echo     HUNGRY BONE SYNDROME: You have been managing low vitamin D  and high calcium  levels.  -Continue taking ergocalciferol  until it is finished, then switch to cholecalciferol  50 mcg daily.  -Reduce your calcitriol  to one capsule daily.

## 2023-10-05 NOTE — Unmapped (Signed)
 Transplant Coordinator, Clinic Visit   Pt seen today by transplant nephrology for follow up, reviewed medications and symptoms.          10/05/23 1442   BP: 143/101   Pulse: 96   Temp: 37.1 ??C (98.8 ??F)   Weight: 87.7 kg (193 lb 6.4 oz)   Height: 170.2 cm (5' 7)   PainSc: 0-No pain       Assessment  BP: not checking at home, encouraged to check  Headache: none  Hand tremors: mild tremors  Heart burn: no, not taking Protonix   Nausea/vomiting: none  Diarrhea/constipation: none  UTI symptoms: none, knows to reach out if any develop  Got Labs today  Plan to start Cholecalciferol  daily.    Living in [place] now      Good appetite; reports adequate hydration.       Any new medications? no  Immunosuppressant last taken: 9 am today, not a true trough, taking 12 mg BID Envarsus       Functional Score: 100   Normal no complaints; no evidence of  disease.       I spent a total of 20 minutes with Paxson Bronk reviewing medications and symptoms.

## 2023-10-06 LAB — TACROLIMUS LEVEL, TROUGH: TACROLIMUS, TROUGH: 15.3 ng/mL — ABNORMAL HIGH (ref 5.0–15.0)

## 2023-10-08 DIAGNOSIS — Z796 Long term current use of immunosuppressive drug: Principal | ICD-10-CM

## 2023-10-08 DIAGNOSIS — Z940 Kidney transplant status: Principal | ICD-10-CM

## 2023-10-09 DIAGNOSIS — R9431 Abnormal electrocardiogram [ECG] [EKG]: Principal | ICD-10-CM

## 2023-10-09 DIAGNOSIS — Z940 Kidney transplant status: Principal | ICD-10-CM

## 2023-10-09 LAB — VITAMIN D 25 HYDROXY: VITAMIN D, TOTAL (25OH): 50.7 ng/mL (ref 20.0–80.0)

## 2023-10-09 NOTE — Unmapped (Signed)
 Order placed for [facility].  Patient provided instructions to pick up kit.

## 2023-10-11 LAB — HLA DS POST TRANSPLANT
ANTI-DONOR DRW #1 MFI: 0 MFI
ANTI-DONOR DRW #2 MFI: 0 MFI
ANTI-DONOR HLA-A #1 MFI: 0 MFI
ANTI-DONOR HLA-A #2 MFI: 0 MFI
ANTI-DONOR HLA-B #1 MFI: 0 MFI
ANTI-DONOR HLA-B #2 MFI: 0 MFI
ANTI-DONOR HLA-C #1 MFI: 22 MFI
ANTI-DONOR HLA-DP #2 MFI: 0 MFI
ANTI-DONOR HLA-DQB #1 MFI: 0 MFI
ANTI-DONOR HLA-DQB #2 MFI: 125 MFI
ANTI-DONOR HLA-DR #1 MFI: 0 MFI
ANTI-DONOR HLA-DR #2 MFI: 0 MFI

## 2023-10-11 LAB — ALLOSURE KIDNEY: ALLOSURE: 0.1 %

## 2023-10-11 LAB — FSAB CLASS 2 ANTIBODY SPECIFICITY: HLA CL2 AB RESULT: POSITIVE

## 2023-10-11 LAB — FSAB CLASS 1 ANTIBODY SPECIFICITY: HLA CLASS 1 ANTIBODY RESULT: POSITIVE

## 2023-10-11 LAB — VITAMIN D 1,25 DIHYDROXY: VITAMIN D 1,25-DIHYDROXY: 66 pg/mL — ABNORMAL HIGH

## 2023-10-15 DIAGNOSIS — Z796 Long term current use of immunosuppressive drug: Principal | ICD-10-CM

## 2023-10-15 DIAGNOSIS — Z940 Kidney transplant status: Principal | ICD-10-CM

## 2023-10-22 DIAGNOSIS — Z796 Long term current use of immunosuppressive drug: Principal | ICD-10-CM

## 2023-10-22 DIAGNOSIS — Z940 Kidney transplant status: Principal | ICD-10-CM

## 2023-10-26 DIAGNOSIS — Z940 Kidney transplant status: Principal | ICD-10-CM

## 2023-10-26 LAB — CBC W/ DIFFERENTIAL
BANDED NEUTROPHILS ABSOLUTE COUNT: 0 x10E3/uL (ref 0.0–0.1)
BASOPHILS ABSOLUTE COUNT: 0 x10E3/uL (ref 0.0–0.2)
BASOPHILS RELATIVE PERCENT: 1 %
EOSINOPHILS ABSOLUTE COUNT: 0 x10E3/uL (ref 0.0–0.4)
EOSINOPHILS RELATIVE PERCENT: 0 %
HEMATOCRIT: 42.9 % (ref 37.5–51.0)
HEMOGLOBIN: 13.9 g/dL (ref 13.0–17.7)
IMMATURE GRANULOCYTES: 0 %
LYMPHOCYTES ABSOLUTE COUNT: 0.9 x10E3/uL (ref 0.7–3.1)
LYMPHOCYTES RELATIVE PERCENT: 14 %
MEAN CORPUSCULAR HEMOGLOBIN CONC: 32.4 g/dL (ref 31.5–35.7)
MEAN CORPUSCULAR HEMOGLOBIN: 32.2 pg (ref 26.6–33.0)
MEAN CORPUSCULAR VOLUME: 99 fL — ABNORMAL HIGH (ref 79–97)
MONOCYTES ABSOLUTE COUNT: 0.6 x10E3/uL (ref 0.1–0.9)
MONOCYTES RELATIVE PERCENT: 11 %
NEUTROPHILS ABSOLUTE COUNT: 4.4 x10E3/uL (ref 1.4–7.0)
NEUTROPHILS RELATIVE PERCENT: 73 %
PLATELET COUNT: 195 x10E3/uL (ref 150–450)
RED BLOOD CELL COUNT: 4.32 x10E6/uL (ref 4.14–5.80)
RED CELL DISTRIBUTION WIDTH: 15.2 % (ref 11.6–15.4)
WHITE BLOOD CELL COUNT: 6 x10E3/uL (ref 3.4–10.8)

## 2023-10-27 LAB — MICROSCOPIC EXAMINATION
BACTERIA: NONE SEEN
CASTS: NONE SEEN /LPF
EPITHELIAL CELLS (NON RENAL): NONE SEEN /HPF (ref 0–10)
RBC URINE: NONE SEEN /HPF (ref 0–2)

## 2023-10-27 LAB — RENAL FUNCTION PANEL
ALBUMIN: 4.3 g/dL (ref 4.1–5.1)
BLOOD UREA NITROGEN: 28 mg/dL — ABNORMAL HIGH (ref 6–24)
BUN / CREAT RATIO: 12 (ref 9–20)
CALCIUM: 9.6 mg/dL (ref 8.7–10.2)
CHLORIDE: 105 mmol/L (ref 96–106)
CO2: 19 mmol/L — ABNORMAL LOW (ref 20–29)
CREATININE: 2.25 mg/dL — ABNORMAL HIGH (ref 0.76–1.27)
EGFR: 37 mL/min/1.73 — ABNORMAL LOW
GLUCOSE: 121 mg/dL — ABNORMAL HIGH (ref 70–99)
PHOSPHORUS, SERUM: 3.9 mg/dL (ref 2.8–4.1)
POTASSIUM: 4.2 mmol/L (ref 3.5–5.2)
SODIUM: 139 mmol/L (ref 134–144)

## 2023-10-27 LAB — URINALYSIS WITH MICROSCOPY
BILIRUBIN UA: NEGATIVE
BLOOD UA: NEGATIVE
GLUCOSE UA: NEGATIVE
KETONES UA: NEGATIVE
NITRITE UA: NEGATIVE
PH UA: 5.5 (ref 5.0–7.5)
SPECIFIC GRAVITY UA: 1.028 (ref 1.005–1.030)
UROBILINOGEN UA: 0.2 mg/dL (ref 0.2–1.0)

## 2023-10-27 LAB — MAGNESIUM: MAGNESIUM: 1.5 mg/dL — ABNORMAL LOW (ref 1.6–2.3)

## 2023-10-29 DIAGNOSIS — Z940 Kidney transplant status: Principal | ICD-10-CM

## 2023-10-29 DIAGNOSIS — Z796 Long term current use of immunosuppressive drug: Principal | ICD-10-CM

## 2023-10-29 LAB — TACROLIMUS LEVEL: TACROLIMUS BLOOD: 14 ng/mL (ref 5.0–20.0)

## 2023-10-30 DIAGNOSIS — Z940 Kidney transplant status: Principal | ICD-10-CM

## 2023-10-30 NOTE — Unmapped (Addendum)
 9/3: new envarsus  rx sent in for 12mg  daily. I spoke with patient via phone today, he verified that 12mg  daily is the correct dose so only needs 4mg  tabs, no longer needs 1mg  tabs. 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      All above HIPAA information was verified with patient.     Was a Nurse, learning disability used for this call? No    Completed refill call assessment today to schedule patient's medication shipment from the [facility] and [facility] Delivery Pharm[facility]  [phone]).  All relevant notes have been reviewed.     Specialty medication(s) and dose(s) confirmed: Patient reports changes to the regimen as follows: Envarsus -pt takes 12 mg in the morning and 8mg  in the evening   Changes to medications: Jveon reports no changes at this time.  Changes to insurance: No  New side effects reported not previously addressed with a pharmacist or physician: None reported  Questions for the pharmacist: No    Confirmed patient received a Conservation officer, historic buildings and a Surveyor, mining with first shipment. The patient will receive a drug information handout for each medication shipped and additional FDA Medication Guides as required.       DISEASE/MEDICATION-SPECIFIC INFORMATION        N/A    SPECIALTY MEDICATION ADHERENCE     Medication Adherence    Patient reported X missed doses in the last month: 0  Specialty Medication: BIKTARVY  50-200-25 mg tablet (bictegrav-emtricit-tenofov ala)  Patient is on additional specialty medications: Yes  Additional Specialty Medications: ENVARSUS  XR 1 mg Tb24 extended release tablet (tacrolimus )  Patient Reported Additional Medication X Missed Doses in the Last Month: 0  Patient is on more than two specialty medications: Yes  Specialty Medication: ENVARSUS  XR 4 mg Tb24 extended release tablet (tacrolimus )  Patient Reported Additional Medication X Missed Doses in the Last Month: 0  Any gaps in refill history greater than 2 weeks in the last 3 months: no  Demonstrates understanding of importance of adherence: yes  Informant: patient  Confirmed plan for next specialty medication refill: delivery by pharmacy  Refills needed for supportive medications: not needed          Refill Coordination    Has the Patients' Contact Information Changed: No  Is the Shipping Address Different: No         Were doses missed due to medication being on hold? No    BIKTARVY  50-200-25 mg: 8 days of medicine on hand   ENVARSUS  XR 1  mg: 7 days of medicine on hand   ENVARSUS  XR 4  mg: 7 days of medicine on hand       REFERRAL TO PHARMACIST     Referral to the pharmacist: Not needed      SHIPPING     Shipping address confirmed in Epic.     Cost and Payment: Patient has a $0 copay, payment information is not required.    Delivery Scheduled: Yes, Expected medication delivery date: 11/02/23.     Medication  will be delivered via UPS to the prescription address in Epic WAM.    [name]   [facility] Specialty and Home Delivery Pharmacy  Specialty Technician

## 2023-10-31 DIAGNOSIS — Z940 Kidney transplant status: Principal | ICD-10-CM

## 2023-10-31 DIAGNOSIS — Z796 Long term current use of immunosuppressive drug: Principal | ICD-10-CM

## 2023-10-31 MED ORDER — TACROLIMUS XR 4 MG TABLET,EXTENDED RELEASE 24 HR
ORAL_TABLET | Freq: Every day | ORAL | 3 refills | 90.00000 days | Status: CP
Start: 2023-10-31 — End: 2024-10-30
  Filled 2023-11-01: qty 270, 90d supply, fill #0

## 2023-10-31 NOTE — Unmapped (Addendum)
 9/4: patient says currently taking envarsus  12mg  am and 8mg  pm as reflected in 8/8 provider EK note. However coordinator C C says dose was changed to 12mg  daily and she sent him a message on this yesterday. She said she will call him to followup and ensure he is aware of the 12mg  daily dose that he should be taking now. Updated wam order to reflect this -[facility]iological [facility] Specialty and Home Delivery Pharmacy - Clinical Pharmacist Intervention   Reason for Intervention Issue Observed Prescription clarification          Additional Comments envarsus . 8/8 note says 12mg  am and 8mg  pm, pt says this is what he is doing. however rx is 12mg  daily. coordinator Coleen C says 12mg  daily is correct, she has sent him a message in [name] and will also call him to followup on this. but to dispense the 12mg  daily rx as it is correct   Intervention Type of Intervention Provider contacted                  Recommendation provided, if applicable      Medication Medication(s) Involved envarsus    Outcome Expected Result/Outcome of Intervention Improved therapy effectiveness  Improved patient safety    Additional Comments      Follow-up Needed?      Additional Follow-up Comments     Supporting Information Approximate Time Spent on Intervention 11-20 minutes    Clinical Evidence Used Chart review or clinical coordination  Clinical guidelines or Drug information resource  Professional judgement           9/3: I also spoke with patient today and he verified he is only using the 4mg  tabs at this time for 12mg  daily dose. He is aware 1mg  rx has been discontinued by clinic -[facility] Pharmacist has reviewed a new prescription for envarsus  4mg  that indicates a dose decrease.  Patient was counseled on this dosage change by provider EK- see epic note from 8/8.  New rx already added into existing work order      Clinical Assessment Needed For: Dose Change  Medication: Envarsus  xr 4 mg   Last Fill Date/Day Supply: 07/13/2023 / 90  Copay $0.00/90  Was previous dose already scheduled to fill: Yes    Notes to Pharmacist: n/a

## 2023-11-01 MED FILL — PREDNISONE 5 MG TABLET: ORAL | 90 days supply | Qty: 90 | Fill #2

## 2023-11-01 MED FILL — DILTIAZEM CD 120 MG CAPSULE,EXTENDED RELEASE 24 HR: ORAL | 30 days supply | Qty: 30 | Fill #4

## 2023-11-01 MED FILL — DAPSONE 100 MG TABLET: ORAL | 30 days supply | Qty: 30 | Fill #2

## 2023-11-01 MED FILL — MINOXIDIL 2.5 MG TABLET: ORAL | 90 days supply | Qty: 90 | Fill #1

## 2023-11-01 MED FILL — BIKTARVY 50 MG-200 MG-25 MG TABLET: ORAL | 30 days supply | Qty: 30 | Fill #4

## 2023-11-01 MED FILL — VENTOLIN HFA 90 MCG/ACTUATION AEROSOL INHALER: RESPIRATORY_TRACT | 17 days supply | Qty: 18 | Fill #10

## 2023-11-05 DIAGNOSIS — Z940 Kidney transplant status: Principal | ICD-10-CM

## 2023-11-05 DIAGNOSIS — Z796 Long term current use of immunosuppressive drug: Principal | ICD-10-CM

## 2023-11-07 LAB — CBC W/ DIFFERENTIAL
BANDED NEUTROPHILS ABSOLUTE COUNT: 0 x10E3/uL (ref 0.0–0.1)
BASOPHILS ABSOLUTE COUNT: 0 x10E3/uL (ref 0.0–0.2)
BASOPHILS RELATIVE PERCENT: 0 %
EOSINOPHILS ABSOLUTE COUNT: 0.1 x10E3/uL (ref 0.0–0.4)
EOSINOPHILS RELATIVE PERCENT: 2 %
HEMATOCRIT: 42.6 % (ref 37.5–51.0)
HEMOGLOBIN: 13.6 g/dL (ref 13.0–17.7)
IMMATURE GRANULOCYTES: 0 %
LYMPHOCYTES ABSOLUTE COUNT: 0.9 x10E3/uL (ref 0.7–3.1)
LYMPHOCYTES RELATIVE PERCENT: 20 %
MEAN CORPUSCULAR HEMOGLOBIN CONC: 31.9 g/dL (ref 31.5–35.7)
MEAN CORPUSCULAR HEMOGLOBIN: 32.6 pg (ref 26.6–33.0)
MEAN CORPUSCULAR VOLUME: 102 fL — ABNORMAL HIGH (ref 79–97)
MONOCYTES ABSOLUTE COUNT: 0.4 x10E3/uL (ref 0.1–0.9)
MONOCYTES RELATIVE PERCENT: 9 %
NEUTROPHILS ABSOLUTE COUNT: 3.2 x10E3/uL (ref 1.4–7.0)
NEUTROPHILS RELATIVE PERCENT: 68 %
NUCLEATED RED BLOOD CELLS: 1 % — ABNORMAL HIGH (ref 0–0)
PLATELET COUNT: 193 x10E3/uL (ref 150–450)
RED BLOOD CELL COUNT: 4.17 x10E6/uL (ref 4.14–5.80)
RED CELL DISTRIBUTION WIDTH: 15.3 % (ref 11.6–15.4)
WHITE BLOOD CELL COUNT: 4.7 x10E3/uL (ref 3.4–10.8)

## 2023-11-08 LAB — RENAL FUNCTION PANEL
ALBUMIN: 4 g/dL — ABNORMAL LOW (ref 4.1–5.1)
BLOOD UREA NITROGEN: 19 mg/dL (ref 6–24)
BUN / CREAT RATIO: 8 — ABNORMAL LOW (ref 9–20)
CALCIUM: 9.5 mg/dL (ref 8.7–10.2)
CHLORIDE: 101 mmol/L (ref 96–106)
CO2: 19 mmol/L — ABNORMAL LOW (ref 20–29)
CREATININE: 2.24 mg/dL — ABNORMAL HIGH (ref 0.76–1.27)
EGFR: 37 mL/min/1.73 — ABNORMAL LOW
GLUCOSE: 104 mg/dL — ABNORMAL HIGH (ref 70–99)
PHOSPHORUS, SERUM: 3.3 mg/dL (ref 2.8–4.1)
POTASSIUM: 4.4 mmol/L (ref 3.5–5.2)
SODIUM: 136 mmol/L (ref 134–144)

## 2023-11-08 LAB — MAGNESIUM: MAGNESIUM: 1.5 mg/dL — ABNORMAL LOW (ref 1.6–2.3)

## 2023-11-09 DIAGNOSIS — Z940 Kidney transplant status: Principal | ICD-10-CM

## 2023-11-09 DIAGNOSIS — Z796 Long term current use of immunosuppressive drug: Principal | ICD-10-CM

## 2023-11-09 LAB — TACROLIMUS LEVEL: TACROLIMUS BLOOD: 4.7 ng/mL — ABNORMAL LOW (ref 5.0–20.0)

## 2023-11-09 MED ORDER — TACROLIMUS XR 4 MG TABLET,EXTENDED RELEASE 24 HR
ORAL_TABLET | Freq: Every day | ORAL | 3 refills | 90.00000 days | Status: CP
Start: 2023-11-09 — End: 2024-11-08
  Filled 2024-01-22: qty 360, 90d supply, fill #0

## 2023-11-09 NOTE — Unmapped (Addendum)
 [facility] Pharmacist has reviewed a new prescription for Envarsus  that indicates a dose increase.  Patient was counseled on this dosage change by CC- see epic encounter from 9/3.  Next refill call date adjusted if necessary.      Clinical Assessment Needed For: Dose Change  Medication: Envarsus  xr 4 mg  Last Fill Date/Day Supply: 11/01/2023 / 90  Copay $0.00/90   Was previous dose already scheduled to fill: No    Notes to Pharmacist: Part B

## 2023-11-12 DIAGNOSIS — Z796 Long term current use of immunosuppressive drug: Principal | ICD-10-CM

## 2023-11-12 DIAGNOSIS — Z940 Kidney transplant status: Principal | ICD-10-CM

## 2023-11-16 ENCOUNTER — Inpatient Hospital Stay: Admit: 2023-11-16 | Discharge: 2023-11-16 | Payer: MEDICARE

## 2023-11-16 DIAGNOSIS — Z940 Kidney transplant status: Principal | ICD-10-CM

## 2023-11-16 DIAGNOSIS — R9431 Abnormal electrocardiogram [ECG] [EKG]: Principal | ICD-10-CM

## 2023-11-19 DIAGNOSIS — Z940 Kidney transplant status: Principal | ICD-10-CM

## 2023-11-19 DIAGNOSIS — Z796 Long term current use of immunosuppressive drug: Principal | ICD-10-CM

## 2023-11-22 DIAGNOSIS — Z940 Kidney transplant status: Principal | ICD-10-CM

## 2023-11-22 LAB — CBC W/ DIFFERENTIAL
BANDED NEUTROPHILS ABSOLUTE COUNT: 0 x10E3/uL (ref 0.0–0.1)
BASOPHILS ABSOLUTE COUNT: 0 x10E3/uL (ref 0.0–0.2)
BASOPHILS RELATIVE PERCENT: 1 %
EOSINOPHILS ABSOLUTE COUNT: 0.1 x10E3/uL (ref 0.0–0.4)
EOSINOPHILS RELATIVE PERCENT: 3 %
HEMATOCRIT: 43.8 % (ref 37.5–51.0)
HEMOGLOBIN: 14 g/dL (ref 13.0–17.7)
IMMATURE GRANULOCYTES: 0 %
LYMPHOCYTES ABSOLUTE COUNT: 0.8 x10E3/uL (ref 0.7–3.1)
LYMPHOCYTES RELATIVE PERCENT: 23 %
MEAN CORPUSCULAR HEMOGLOBIN CONC: 32 g/dL (ref 31.5–35.7)
MEAN CORPUSCULAR HEMOGLOBIN: 32.3 pg (ref 26.6–33.0)
MEAN CORPUSCULAR VOLUME: 101 fL — ABNORMAL HIGH (ref 79–97)
MONOCYTES ABSOLUTE COUNT: 0.3 x10E3/uL (ref 0.1–0.9)
MONOCYTES RELATIVE PERCENT: 9 %
NEUTROPHILS ABSOLUTE COUNT: 2.3 x10E3/uL (ref 1.4–7.0)
NEUTROPHILS RELATIVE PERCENT: 63 %
NUCLEATED RED BLOOD CELLS: 1 % — ABNORMAL HIGH (ref 0–0)
PLATELET COUNT: 175 x10E3/uL (ref 150–450)
RED BLOOD CELL COUNT: 4.34 x10E6/uL (ref 4.14–5.80)
RED CELL DISTRIBUTION WIDTH: 15.5 % — ABNORMAL HIGH (ref 11.6–15.4)
WHITE BLOOD CELL COUNT: 3.5 x10E3/uL (ref 3.4–10.8)

## 2023-11-22 NOTE — Unmapped (Signed)
 The [facility] has made a second and final attempt to reach this patient to refill the following medication:Envarsus  and Biktarvy .      We have left voicemails on the following phone numbers: [phone], have sent a MyChart message, and have sent a text message to the following phone numbers: [phone].    Dates contacted: 11/16/23 and 11/22/23  Last scheduled delivery: 11/01/23    The patient may be at risk of non-compliance with this medication. The patient should call the [facility] at [phone]  Option 4, then Option 4: Infectious Disease, Transplant to refill medication.    Christian Brown   [facility] Specialty and [facility]

## 2023-11-23 LAB — RENAL FUNCTION PANEL
ALBUMIN: 4 g/dL — ABNORMAL LOW (ref 4.1–5.1)
BLOOD UREA NITROGEN: 20 mg/dL (ref 6–24)
BUN / CREAT RATIO: 9 (ref 9–20)
CALCIUM: 9.2 mg/dL (ref 8.7–10.2)
CHLORIDE: 104 mmol/L (ref 96–106)
CO2: 22 mmol/L (ref 20–29)
CREATININE: 2.16 mg/dL — ABNORMAL HIGH (ref 0.76–1.27)
EGFR: 39 mL/min/1.73 — ABNORMAL LOW
GLUCOSE: 92 mg/dL (ref 70–99)
PHOSPHORUS, SERUM: 3.3 mg/dL (ref 2.8–4.1)
POTASSIUM: 4.4 mmol/L (ref 3.5–5.2)
SODIUM: 139 mmol/L (ref 134–144)

## 2023-11-23 LAB — MAGNESIUM: MAGNESIUM: 1.5 mg/dL — ABNORMAL LOW (ref 1.6–2.3)

## 2023-11-24 LAB — URINALYSIS WITH MICROSCOPY

## 2023-11-26 DIAGNOSIS — Z796 Long term current use of immunosuppressive drug: Principal | ICD-10-CM

## 2023-11-26 DIAGNOSIS — Z940 Kidney transplant status: Principal | ICD-10-CM

## 2023-11-26 LAB — URINALYSIS WITH MICROSCOPY

## 2023-11-27 LAB — TACROLIMUS LEVEL: TACROLIMUS BLOOD: 5.2 ng/mL (ref 5.0–20.0)

## 2023-12-03 DIAGNOSIS — Z796 Long term current use of immunosuppressive drug: Principal | ICD-10-CM

## 2023-12-03 DIAGNOSIS — Z940 Kidney transplant status: Principal | ICD-10-CM

## 2023-12-03 MED ORDER — ALBUTEROL SULFATE HFA 90 MCG/ACTUATION AEROSOL INHALER
Freq: Four times a day (QID) | RESPIRATORY_TRACT | 11 refills | 17.00000 days | Status: CP | PRN
Start: 2023-12-03 — End: ?
  Filled 2023-12-05: qty 18, 17d supply, fill #0

## 2023-12-03 NOTE — Unmapped (Signed)
 [facility] Specialty and Home Delivery Pharmacy Refill Coordination Note    Specialty Medication(s) to be Shipped:   Infectious Disease: Biktarvy     Other medication(s) to be shipped: albuterol : VENTOLIN  HFA 90 mcg/actuation inhaler,dilTIAZem  120 MG 24 hr capsule (CARDIZEM  CD) and dapsone  100 MG tablet     Specialty Medications not needed at this time: N/A     Christian Brown, DOB: 13-Oct-1980  Phone: There are no phone numbers on file.      All above HIPAA information was verified with patient.     Was a Nurse, learning disability used for this call? No    Completed refill call assessment today to schedule patient's medication shipment from the [facility] and Home Delivery Pharmacy  [phone]).  All relevant notes have been reviewed.     Specialty medication(s) and dose(s) confirmed: Regimen is correct and unchanged.   Changes to medications: Olive reports no changes at this time.  Changes to insurance: No  New side effects reported not previously addressed with a pharmacist or physician: None reported  Questions for the pharmacist: No    Confirmed patient received a Conservation officer, historic buildings and a Surveyor, mining with first shipment. The patient will receive a drug information handout for each medication shipped and additional FDA Medication Guides as required.       DISEASE/MEDICATION-SPECIFIC INFORMATION        N/A    SPECIALTY MEDICATION ADHERENCE     Medication Adherence    Patient reported X missed doses in the last month: 0  Specialty Medication: bictegrav-emtricit-tenofov ala: BIKTARVY  50-200-25 mg tablet  Patient is on additional specialty medications: No              Were doses missed due to medication being on hold? No     bictegrav-emtricit-tenofov ala: BIKTARVY  50-200-25 mg tablet: 6 days of medicine on hand       REFERRAL TO PHARMACIST     Referral to the pharmacist: Not needed      [facility]     Shipping address confirmed in Epic.     Cost and Payment: Patient has a $0 copay, payment information is not required.    Delivery Scheduled: Yes, Expected medication delivery date: 12/06/2023.     Medication will be delivered via UPS to the prescription address in Epic WAM.    Lucie CHRISTELLA Forts   [facility] Specialty and Home Delivery Pharmacy  Specialty Technician

## 2023-12-05 MED ORDER — CHOLECALCIFEROL (VITAMIN D3) 50 MCG (2,000 UNIT) TABLET
ORAL_TABLET | Freq: Every day | ORAL | 3 refills | 90.00000 days | Status: CP
Start: 2023-12-05 — End: 2023-10-05

## 2023-12-05 MED FILL — BIKTARVY 50 MG-200 MG-25 MG TABLET: ORAL | 30 days supply | Qty: 30 | Fill #0

## 2023-12-05 MED FILL — DILTIAZEM CD 120 MG CAPSULE,EXTENDED RELEASE 24 HR: ORAL | 30 days supply | Qty: 30 | Fill #5

## 2023-12-05 MED FILL — DAPSONE 100 MG TABLET: ORAL | 30 days supply | Qty: 30 | Fill #3

## 2023-12-10 DIAGNOSIS — Z940 Kidney transplant status: Principal | ICD-10-CM

## 2023-12-10 DIAGNOSIS — Z796 Long term current use of immunosuppressive drug: Principal | ICD-10-CM

## 2023-12-17 DIAGNOSIS — Z796 Long term current use of immunosuppressive drug: Principal | ICD-10-CM

## 2023-12-17 DIAGNOSIS — Z940 Kidney transplant status: Principal | ICD-10-CM

## 2023-12-24 DIAGNOSIS — Z940 Kidney transplant status: Principal | ICD-10-CM

## 2023-12-24 DIAGNOSIS — Z796 Long term current use of immunosuppressive drug: Principal | ICD-10-CM

## 2023-12-31 DIAGNOSIS — Z940 Kidney transplant status: Principal | ICD-10-CM

## 2023-12-31 DIAGNOSIS — Z796 Long term current use of immunosuppressive drug: Principal | ICD-10-CM

## 2024-01-01 DIAGNOSIS — I158 Other secondary hypertension: Principal | ICD-10-CM

## 2024-01-01 DIAGNOSIS — Z949 Transplanted organ and tissue status, unspecified: Principal | ICD-10-CM

## 2024-01-01 DIAGNOSIS — Z940 Kidney transplant status: Principal | ICD-10-CM

## 2024-01-01 DIAGNOSIS — Z796 Long term current use of immunosuppressive drug: Principal | ICD-10-CM

## 2024-01-01 MED ORDER — DILTIAZEM CD 120 MG CAPSULE,EXTENDED RELEASE 24 HR
ORAL_CAPSULE | Freq: Every day | ORAL | 5 refills | 30.00000 days
Start: 2024-01-01 — End: 2024-06-29

## 2024-01-01 NOTE — Progress Notes (Signed)
 [facility] Specialty and Home Delivery Pharmacy Refill Coordination Note    Specialty Medication(s) to be Shipped:   Infectious Disease: bictegrav-emtricit-tenofov ala: BIKTARVY  50-200-25 mg tablet    Other medication(s) to be shipped: diltiazem  120 mg    Specialty Medications not needed at this time: Transplant: tacrolimus : ENVARSUS  XR 4 mg Tb24 extended release tablet     Christian Brown, DOB: 1980/09/25  Phone: There are no phone numbers on file.      All above HIPAA information was verified with patient.     Was a nurse, learning disability used for this call? No    Completed refill call assessment today to schedule patient's medication shipment from the [facility] and Home Delivery Pharmacy  [phone]).  All relevant notes have been reviewed.     Specialty medication(s) and dose(s) confirmed: Regimen is correct and unchanged.   Changes to medications: Kaylon reports no changes at this time.  Changes to insurance: No  New side effects reported not previously addressed with a pharmacist or physician: None reported  Questions for the pharmacist: No    Confirmed patient received a Conservation Officer, Historic Buildings and a Surveyor, Mining with first shipment. The patient will receive a drug information handout for each medication shipped and additional FDA Medication Guides as required.       DISEASE/MEDICATION-SPECIFIC INFORMATION        N/A    SPECIALTY MEDICATION ADHERENCE     Medication Adherence    Patient reported X missed doses in the last month: 0  Specialty Medication: bictegrav-emtricit-tenofov ala: BIKTARVY  50-200-25 mg tablet  Patient is on additional specialty medications: No  Informant: patient                Were doses missed due to medication being on hold? No    bictegrav-emtricit-tenofov ala: BIKTARVY  50-200-25 mg tablet : 13 days of medicine on hand       REFERRAL TO PHARMACIST     Referral to the pharmacist: Not needed      SHIPPING     Shipping address confirmed in Epic.     Cost and Payment: Patient has a $0 copay, payment information is not required.    Delivery Scheduled: Yes, Expected medication delivery date: 11/11.     Medication will be delivered via UPS to the prescription address in Epic WAM.    Jeoffrey JAYSON Sherra UNK Specialty and [facility]

## 2024-01-02 DIAGNOSIS — Z940 Kidney transplant status: Principal | ICD-10-CM

## 2024-01-02 MED ORDER — DILTIAZEM CD 120 MG CAPSULE,EXTENDED RELEASE 24 HR
ORAL_CAPSULE | Freq: Every day | ORAL | 5 refills | 30.00000 days | Status: CP
Start: 2024-01-02 — End: 2024-06-30
  Filled 2024-01-07: qty 30, 30d supply, fill #0

## 2024-01-07 ENCOUNTER — Ambulatory Visit: Admitting: Infectious Disease

## 2024-01-07 DIAGNOSIS — Z940 Kidney transplant status: Principal | ICD-10-CM

## 2024-01-07 DIAGNOSIS — Z796 Long term current use of immunosuppressive drug: Principal | ICD-10-CM

## 2024-01-07 MED FILL — BIKTARVY 50 MG-200 MG-25 MG TABLET: ORAL | 30 days supply | Qty: 30 | Fill #1

## 2024-01-14 DIAGNOSIS — Z940 Kidney transplant status: Principal | ICD-10-CM

## 2024-01-14 DIAGNOSIS — Z796 Long term current use of immunosuppressive drug: Principal | ICD-10-CM

## 2024-01-16 NOTE — Progress Notes (Signed)
 [facility] Specialty and Home Delivery Pharmacy Refill Coordination Note    Specialty Medication(s) to be Shipped:   Transplant: Envarsus  4mg     Other medication(s) to be shipped: Ventolin     Specialty Medications not needed at this time: N/A     Christian Brown, DOB: 1980/06/01  Phone: There are no phone numbers on file.      All above HIPAA information was verified with patient.     Was a nurse, learning disability used for this call? No    Completed refill call assessment today to schedule patient's medication shipment from the [facility] and Home Delivery Pharmacy  [phone]).  All relevant notes have been reviewed.     Specialty medication(s) and dose(s) confirmed: Regimen is correct and unchanged.   Changes to medications: Christian Brown reports no changes at this time.  Changes to insurance: No  New side effects reported not previously addressed with a pharmacist or physician: None reported  Questions for the pharmacist: No    Confirmed patient received a Conservation Officer, Historic Buildings and a Surveyor, Mining with first shipment. The patient will receive a drug information handout for each medication shipped and additional FDA Medication Guides as required.       DISEASE/MEDICATION-SPECIFIC INFORMATION        N/A    SPECIALTY MEDICATION ADHERENCE     Medication Adherence    Patient reported X missed doses in the last month: 0  Specialty Medication: tacrolimus : ENVARSUS  XR 4 mg Tb24 extended release tablet  Patient is on additional specialty medications: No  Patient is on more than two specialty medications: No  Any gaps in refill history greater than 2 weeks in the last 3 months: no  Demonstrates understanding of importance of adherence: yes  Informant: patient  Confirmed plan for next specialty medication refill: delivery by pharmacy  Refills needed for supportive medications: not needed          Refill Coordination    Has the Patients' Contact Information Changed: No  Is the Shipping Address Different: No         Were doses missed due to medication being on hold? No    tacrolimus : ENVARSUS  XR 4 mg: 15 days of medicine on hand     REFERRAL TO PHARMACIST     Referral to the pharmacist: Not needed      [facility]     Shipping address confirmed in Epic.     Cost and Payment: Patient has a $0 copay, payment information is not required.    Delivery Scheduled: Yes, Expected medication delivery date: 01/23/24.     Medication will be delivered via UPS to the prescription address in Epic WAM.    Christian Brown   [facility] Specialty and Home Delivery Pharmacy  Specialty Technician

## 2024-01-21 DIAGNOSIS — Z796 Long term current use of immunosuppressive drug: Principal | ICD-10-CM

## 2024-01-21 DIAGNOSIS — Z940 Kidney transplant status: Principal | ICD-10-CM

## 2024-01-22 MED FILL — VENTOLIN HFA 90 MCG/ACTUATION AEROSOL INHALER: RESPIRATORY_TRACT | 17 days supply | Qty: 18 | Fill #1

## 2024-01-28 DIAGNOSIS — Z940 Kidney transplant status: Principal | ICD-10-CM

## 2024-01-29 DIAGNOSIS — Z796 Long term current use of immunosuppressive drug: Principal | ICD-10-CM

## 2024-01-29 DIAGNOSIS — Z940 Kidney transplant status: Principal | ICD-10-CM

## 2024-01-29 DIAGNOSIS — N184 Chronic kidney disease, stage 4 (severe): Principal | ICD-10-CM

## 2024-01-29 DIAGNOSIS — E119 Type 2 diabetes mellitus without complications: Principal | ICD-10-CM

## 2024-01-31 NOTE — Progress Notes (Signed)
 [facility] Specialty and Home Delivery Pharmacy Refill Coordination Note    Specialty Medication(s) to be Shipped:   Infectious Disease: Biktarvy     Other medication(s) to be shipped: calcitriol , azathioprine , diltiazem , minoxidil     Specialty Medications not needed at this time: N/A     Christian Brown, DOB: 06-Sep-1980  Phone: There are no phone numbers on file.      All above HIPAA information was verified with patient.     Was a nurse, learning disability used for this call? No    Completed refill call assessment today to schedule patient's medication shipment from the [facility] and Home Delivery Pharmacy  [phone]).  All relevant notes have been reviewed.     Specialty medication(s) and dose(s) confirmed: Regimen is correct and unchanged.   Changes to medications: Jontue reports no changes at this time.  Changes to insurance: No  New side effects reported not previously addressed with a pharmacist or physician: None reported  Questions for the pharmacist: No    Confirmed patient received a Conservation Officer, Historic Buildings and a Surveyor, Mining with first shipment. The patient will receive a drug information handout for each medication shipped and additional FDA Medication Guides as required.       DISEASE/MEDICATION-SPECIFIC INFORMATION        N/A    SPECIALTY MEDICATION ADHERENCE     Medication Adherence    Patient reported X missed doses in the last month: 0  Specialty Medication: bictegrav-emtricit-tenofov ala: BIKTARVY  50-200-25 mg tablet  Patient is on additional specialty medications: No  Patient is on more than two specialty medications: No  Any gaps in refill history greater than 2 weeks in the last 3 months: no  Demonstrates understanding of importance of adherence: yes  Informant: patient  Confirmed plan for next specialty medication refill: delivery by pharmacy  Refills needed for supportive medications: not needed          Refill Coordination    Has the Patients' Contact Information Changed: No  Is the Shipping Address Different: No         Were doses missed due to medication being on hold? No    bictegrav-emtricit-tenofov ala: BIKTARVY  50-200-25   mg: 14 days of medicine on hand       REFERRAL TO PHARMACIST     Referral to the pharmacist: Not needed      [facility]     Shipping address confirmed in Epic.     Cost and Payment: Patient has a $0 copay, payment information is not required.    Delivery Scheduled: Yes, Expected medication delivery date: 02/07/24.     Medication will be delivered via UPS to the prescription address in Epic WAM.    [name]   [facility] Specialty and Home Delivery Pharmacy  Specialty Technician

## 2024-02-04 DIAGNOSIS — Z940 Kidney transplant status: Principal | ICD-10-CM

## 2024-02-06 MED FILL — DILTIAZEM CD 120 MG CAPSULE,EXTENDED RELEASE 24 HR: ORAL | 30 days supply | Qty: 30 | Fill #1

## 2024-02-06 MED FILL — MINOXIDIL 2.5 MG TABLET: ORAL | 90 days supply | Qty: 90 | Fill #2

## 2024-02-06 MED FILL — BIKTARVY 50 MG-200 MG-25 MG TABLET: ORAL | 30 days supply | Qty: 30 | Fill #2

## 2024-02-06 MED FILL — CALCITRIOL 0.5 MCG CAPSULE: ORAL | 90 days supply | Qty: 90 | Fill #0

## 2024-02-06 MED FILL — AZATHIOPRINE 100 MG TABLET: ORAL | 90 days supply | Qty: 90 | Fill #3

## 2024-02-10 NOTE — Progress Notes (Deleted)
 Subjective:   Chief complaint: follow-up for HIV disease on medications   Patient ID: Jim Wyatt, male    DOB: 03/21/1980, 43 y.o.   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  Went on HD Dec 2016.    Fever    Genital warts 03/08/2018   Headache    HIV infection (HCC) dx'd 10/2013   Hyperlipidemia 05/25/2022   Hypertension    Leg pain    Multiple environmental allergies    trees, dogs, cats   Multiple food allergies    Need for prophylactic vaccination against Streptococcus pneumoniae (pneumococcus) 08/28/2017   Seizures (HCC)    last seizure per pt in Jan   Shingles < 2010   Vaccine counseling 02/01/2021    Past Surgical History:  Procedure Laterality Date   AV FISTULA PLACEMENT Left 03/03/2015   Procedure: Creation of Left Arm RADIOCEPHALIC ARTERIOVENOUS FISTULA ;  Surgeon: Lynwood JONETTA Collum, MD;  Location: [facility] OR;  Service: Vascular;  Laterality: Left;   COLONOSCOPY N/A 03/18/2016   Procedure: COLONOSCOPY;  Surgeon: Elspeth Deward Naval, MD;  Location: [facility] ENDOSCOPY;  Service: Gastroenterology;  Laterality: N/A;   FEMUR FRACTURE SURGERY Right 09/2001   FRACTURE SURGERY     I & D EXTREMITY Right 12/18/2014   Procedure: IRRIGATION AND DEBRIDEMENT RIGHT LOWER EXTREMITY;  Surgeon: Lynda Leos, MD;  Location: [facility] OR;  Service: General;  Laterality: Right;   INSERTION OF DIALYSIS CATHETER  2016   LAPAROTOMY N/A 12/28/2015   Procedure: EXPLORATORY LAPAROTOMY BOWEL RESECTION ILEOCECECTOMY;  Surgeon: Mitzie DELENA Freund, MD;  Location: [facility] OR;  Service: General;  Laterality: N/A;    Family History   Problem Relation Age of Onset   Hypertension Mother    Diabetes Maternal Grandmother    Kidney disease Maternal Grandmother       Social History   Socioeconomic History   Marital status: Single    Spouse name: Not on file   Number of children: Not on file   Years of education: Not on file   Highest education level: Not on file  Occupational History   Not on file  Tobacco Use   Smoking status: Never   Smokeless tobacco: Never  Substance and Sexual Activity   Alcohol  use: No    Comment: I stopped drinking along time ago    Drug use: No   Sexual activity: Not Currently    Partners: Female    Birth control/protection: Condom    Comment: declined condoms 06/2020  Other Topics Concern   Not on file  Social History Narrative   Not on file   Social Drivers of Health   Tobacco Use: Low Risk (10/05/2023)   Received from [facility]   Patient History    Passive Exposure: Not on file    Smokeless Tobacco Use: Never    Smoking Tobacco Use: Never  Financial Resource Strain: Low Risk (09/06/2022)   Received from [facility]   Overall Financial Resource Strain (CARDIA)    Difficulty of Paying Living Expenses: Not hard at all  Food Insecurity: No Food  Insecurity (09/06/2022)   Received [facility]   Epic    Within the past 12 months, you worried that your food would run out before you got the money to buy more.: Never true    Within the past 12 months, the food you bought just didn't last and you didn't have money to get more.: Never true  Transportation Needs: No Transportation Needs (09/06/2022)   Received from [facility] - Transportation    Lack of Transportation (Medical): No    Lack of Transportation (Non-Medical): No  Physical Activity: Not on file  Stress: Not on file  Social Connections: Not on file  Depression (PHQ2-9): Low Risk (05/25/2022)   Depression (PHQ2-9)    PHQ-2 Score: 0  Alcohol  Screen: Not on file  Housing: Not on file   Utilities: Low Risk (09/06/2022)   Received from [facility]   Utilities    Within the past 12 months, have you been unable to get utilities(heat, electricity) when it was really needed?: No  Health Literacy: Not on file    Allergies[1]  Current Medications[2]   Review of Systems     Objective:   Physical Exam        Assessment & Plan:       [1]  Allergies Allergen Reactions   Bactrim  [Sulfamethoxazole -Trimethoprim ] Other (See Comments)    Renal failure   Doxercalciferol  Hives   Fish Allergy Hives and Shortness Of Breath    Throat closes up Please do not serve fish for meals. Highly allergic  Please do not serve fish for meals. Highly allergic  Please do not serve fish for meals. Highly allergic  Please do not serve fish for meals. Highly allergic     Iron Sucrose  Hives   Onion Hives and Other (See Comments)    Per allergy test   Procardia  [Nifedipine ] Hives, Shortness Of Breath and Itching   Reglan  [Metoclopramide ] Other (See Comments)    Causes ticks/ jerks   Vancomycin  Other (See Comments)    Patient states vancomycin  caused kidney injury   Metoprolol  Hives   Sulfamethoxazole     Amlodipine  Palpitations and Other (See Comments)    Causes fatigue   Augmentin [Amoxicillin-Pot Clavulanate] Hives   Calcitriol  Hives  [2]  Current Outpatient Medications:    acetaminophen  (TYLENOL ) 500 MG tablet, Take 1,000 mg by mouth every 6 (six) hours as needed (pain)., Disp: , Rfl:    albuterol  (PROVENTIL  HFA;VENTOLIN  HFA) 108 (90 Base) MCG/ACT inhaler, Inhale 2 puffs into the lungs every 6 (six) hours as needed for wheezing or shortness of breath., Disp: 1 Inhaler, Rfl: 3   azathioprine (IMURAN) 100 MG tablet, Take 100 mg by mouth daily., Disp: , Rfl:    bictegravir-emtricitabine -tenofovir  AF (BIKTARVY ) 50-200-25 MG TABS tablet, Take 1 tablet by mouth daily., Disp: 30 tablet, Rfl: 11   calcitRIOL  (ROCALTROL ) 0.5 MCG capsule, Take 0.5 mcg by mouth daily. 2 mcg BID.,  Disp: , Rfl:    carvedilol  (COREG ) 12.5 MG tablet, Take 12.5 mg by mouth 2 (two) times daily with a meal., Disp: , Rfl:    dapsone  100 MG tablet, Take 1 tablet (100 mg total) by mouth daily., Disp: 30 tablet, Rfl: 5   doxycycline  (VIBRA -TABS) 100 MG tablet, Take 1 tablet (100 mg total) by mouth 2 (two) times daily. (Patient not taking: Reported on 06/18/2023), Disp: 56 tablet, Rfl: 0   hydrALAZINE  (APRESOLINE ) 25 MG tablet, TAKE 1 TABLET BY MOUTH THREE TIMES A DAY AS  DIRECTED (Patient not taking: Reported on 06/18/2023), Disp: , Rfl: 0   HYDROcodone -acetaminophen  (NORCO/VICODIN) 5-325 MG tablet, 1 tablet every 4 (four) hours as needed., Disp: , Rfl:    imiquimod  (ALDARA ) 5 % cream, Apply a thin layer prior TO bedtime 3x week AND LEAVE ON FOR 6-10 HOURS. Wash off with soap AND WATER .] (Patient not taking: Reported on 06/18/2023), Disp: 24 each, Rfl: 0   ipratropium-albuterol  (DUONEB) 0.5-2.5 (3) MG/3ML SOLN, Inhale 1 vial by nebulizer every 6 hours (Patient not taking: Reported on 06/18/2023), Disp: 180 mL, Rfl: 1   isosorbide  mononitrate (IMDUR ) 60 MG 24 hr tablet, Take 1 tablet (60 mg total) by mouth daily. (Patient not taking: Reported on 06/18/2023), Disp: 30 tablet, Rfl: 0   losartan  (COZAAR ) 100 MG tablet, Take 1 tablet (100 mg total) by mouth daily. (Patient not taking: Reported on 06/18/2023), Disp: 90 tablet, Rfl: 3   minoxidil (LONITEN) 10 MG tablet, Take by mouth. (Patient not taking: Reported on 06/18/2023), Disp: , Rfl:    minoxidil (LONITEN) 2.5 MG tablet, Take by mouth daily., Disp: , Rfl:    predniSONE  (DELTASONE ) 5 MG tablet, Take 5 mg by mouth daily with breakfast., Disp: , Rfl:    tacrolimus ER (ENVARSUS XR) 1 MG TB24, Take 3 mg by mouth 2 (two) times daily with a meal., Disp: , Rfl:    tacrolimus ER (ENVARSUS XR) 4 MG TB24, Take 12 mg by mouth 2 (two) times daily., Disp: , Rfl:    valsartan (DIOVAN) 160 MG tablet, , Disp: , Rfl:    Vitamin D, Ergocalciferol, (DRISDOL) 1.25 MG (50000 UNIT)  CAPS capsule, Take 50,000 Units by mouth every 7 (seven) days., Disp: , Rfl:

## 2024-02-11 ENCOUNTER — Ambulatory Visit: Admitting: Infectious Disease

## 2024-02-11 DIAGNOSIS — N186 End stage renal disease: Secondary | ICD-10-CM

## 2024-02-11 DIAGNOSIS — D638 Anemia in other chronic diseases classified elsewhere: Secondary | ICD-10-CM

## 2024-02-11 DIAGNOSIS — B20 Human immunodeficiency virus [HIV] disease: Secondary | ICD-10-CM

## 2024-02-11 DIAGNOSIS — E785 Hyperlipidemia, unspecified: Secondary | ICD-10-CM

## 2024-02-11 DIAGNOSIS — Z940 Kidney transplant status: Principal | ICD-10-CM

## 2024-02-18 DIAGNOSIS — Z940 Kidney transplant status: Principal | ICD-10-CM

## 2024-03-06 NOTE — Progress Notes (Signed)
 [facility] Clinical Assessment & Refill Coordination Note    Christian Brown, DOB: Sep 06, 1980  Phone: There are no phone numbers on file.    All above HIPAA information was verified with patient.     Was a nurse, learning disability used for this call? 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    Medication list has been reviewed and updated in Epic: Yes    Allergies[2]    Changes to allergies: No    Allergies have been reviewed and updated in Epic: Yes    SPECIALTY MEDICATION ADHERENCE     Envarsus  4 mg: 40 days of medicine on hand   Biktarvy  50-200-25 mg: 7 days of medicine on hand       Specialty medication is an injection or given on a cycle: No    Medication Adherence    Patient reported X missed doses in the last month: 0  Specialty Medication: Biktavry 50-200-25mg   Patient is on additional specialty medications: Yes  Additional Specialty Medications: Envarsus  4mg   Patient Reported Additional Medication X Missed Doses in the Last Month: 0  Patient is on more than two specialty medications: No  Informant: patient          Specialty medication(s) dose(s) confirmed: Regimen is correct and unchanged.     Are there any concerns with adherence? No    Adherence counseling provided? Not needed    CLINICAL MANAGEMENT AND INTERVENTION      Clinical Benefit Assessment:    Do you feel the medicine is effective or helping your condition? Yes    Clinical Benefit counseling provided? Not needed    Adverse Effects Assessment:    Are you experiencing any side effects? No    Are you experiencing difficulty administering your medicine? No    Quality of Life Assessment:    Quality of Life    Rheumatology  Oncology  Dermatology  Cystic Fibrosis          How many days over the past month did your kidney transplant  keep you from your normal activities? For example, brushing your teeth or getting up in the morning. 0    Have you discussed this with your provider? Not needed    Acute Infection Status:    Acute infections noted within Epic:  No active infections    Patient reported infection: None    Therapy Appropriateness:    Is the medication and dose appropriate considering the patient???s diagnosis, treatment, and disease journey, comorbidities, medical history, current medications, allergies, therapeutic goals, self-administration ability, and access barriers? Yes, therapy is appropriate and should be continued     Clinical Intervention:    Was an intervention completed as part of this clinical assessment? No    DISEASE/MEDICATION-SPECIFIC INFORMATION      N/A    Solid Organ Transplant: Not Applicable    PATIENT SPECIFIC NEEDS     Does the patient have any physical, cognitive, or cultural barriers? No    Is the patient high risk? No    Does the patient require physician intervention or other additional services (i.e., nutrition, smoking cessation, social work)? No    Does the patient have an additional or emergency contact listed in their chart? Yes    SOCIAL DETERMINANTS OF HEALTH     At the [facility], we  have learned that life circumstances - like trouble affording food, housing, utilities, or transportation can affect the health of many of our patients.   That is why we wanted to ask: are you currently experiencing any life circumstances that are negatively impacting your health and/or quality of life? Patient declined to answer    Social Drivers of Health     Food Insecurity: No Food Insecurity (09/06/2022)    Hunger Vital Sign     Worried About Running Out of Food in the Last Year: Never true     Ran Out of Food in the Last Year: Never true   Tobacco Use: Low Risk (10/05/2023)    Patient History     Smoking Tobacco Use: Never     Smokeless Tobacco Use: Never     Passive Exposure: Not on file   Transportation Needs: No Transportation Needs (09/06/2022)    PRAPARE - Transportation     Lack of Transportation (Medical): No     Lack of Transportation (Non-Medical): No   Alcohol Use: Not on file   Housing: Low Risk (09/06/2022)    Housing     Within the past 12 months, have you ever stayed: outside, in a car, in a tent, in an overnight shelter, or temporarily in someone else's home (i.e. couch-surfing)?: No     Are you worried about losing your housing?: No   Physical Activity: Not on file   Utilities: Low Risk (09/06/2022)    Utilities     Within the past 12 months, have you been unable to get utilities (heat, electricity) when it was really needed?: No   Stress: Not on file   Interpersonal Safety: Not At Risk (06/22/2023)    Interpersonal Safety     Unsafe Where You Currently Live: No     Physically Hurt by Anyone: No     Abused by Anyone: No   Substance Use: Not on file (01/05/2023)   Intimate Partner Violence: Patient Declined (03/05/2022)    Received from [place] [facility] [employer], Afraid, Rape, and Kick questionnaire     Within the last year, have you been afraid of your partner or ex-partner?: Patient declined     Within the last year, have you been humiliated or emotionally abused in other ways by your partner or ex-partner?: Patient declined     Within the last year, have you been kicked, hit, slapped, or otherwise physically hurt by your partner or ex-partner?: Patient declined     Within the last year, have you been raped or forced to have any kind of sexual activity by your partner or ex-partner?: Patient declined   Social Connections: Not on file   Financial Resource Strain: Low Risk (09/06/2022)    Overall Financial Resource Strain (CARDIA)     Difficulty of Paying Living Expenses: Not hard at all   Health Literacy: Not on file   Internet Connectivity: Not on file       Would you be willing to receive help with any of the needs that you have identified today? 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 They are aware and have authorized  the pharmacy to charge the credit card on file.    Delivery Scheduled: Yes, Expected medication delivery date: 1/13.     Medication will be delivered via UPS to the confirmed single use address in [facility].    The patient will receive a drug information handout for each medication shipped and additional FDA Medication Guides as required.  Verified that patient has previously received a Conservation Officer, Historic Buildings and a Surveyor, Mining.    The patient or caregiver noted above participated in the development of this care plan and knows that they can request review of or adjustments to the care plan at any time.      All of the patient's questions and concerns have been addressed.    Harlene Gal, PharmD   [facility] Specialty Pharmacist       [1]   Current Outpatient Medications   Medication Sig Dispense Refill    albuterol  HFA 90 mcg/actuation inhaler Inhale 1-2 puffs every four (4) to six (6) hours as needed for wheezing or shortness of breath. 18 g 11    azathioprine  (AZASAN ) 100 mg tablet Take 1 tablet (100 mg total) by mouth daily. 90 tablet 3    bictegrav-emtricit-tenofov ala (BIKTARVY ) 50-200-25 mg tablet Take 1 tablet by mouth every morning. 30 tablet 11    bictegrav-emtricit-tenofov ala (BIKTARVY ) 50-200-25 mg tablet Take 1 tablet by mouth daily. 30 tablet 11    calcitriol  (ROCALTROL ) 0.5 MCG capsule Take 1 capsule (0.5 mcg total) by mouth daily. 90 capsule 3    cholecalciferol , vitamin D3-50 mcg, 2,000 unit,, 50 mcg (2,000 unit) tablet Take 1 tablet (50 mcg total) by mouth daily. 90 tablet 3    dapsone  100 MG tablet Take 1 tablet (100 mg total) by mouth daily. 30 tablet 5    dapsone  100 MG tablet Take 1 tablet (100 mg total) by mouth daily. 30 tablet 5    DIETARY SUPPLEMENT,MISC COMB14 ORAL Taking magnesium  gummy supplement once daily. Dosage strength & product unknown      dilTIAZem  (CARDIZEM  CD) 120 MG 24 hr capsule Take 1 capsule (120 mg total) by mouth daily. 30 capsule 5    empty container Misc Use as directed with Zarxio  (Patient not taking: Reported on 03/21/2023) 1 each 0    HYDROcodone-acetaminophen  (NORCO) 5-325 mg per tablet Take 1 tablet by mouth daily as needed for pain.      minoxidil  (LONITEN ) 2.5 MG tablet Take 1 tablet (2.5 mg total) by mouth daily. 90 tablet 3    pantoprazole  (PROTONIX ) 40 MG tablet Take 1 tablet (40 mg total) by mouth daily as needed. 90 tablet 1    predniSONE  (DELTASONE ) 5 MG tablet Take 1 tablet (5 mg total) by mouth in the morning. 90 tablet 3    tacrolimus  (ENVARSUS  XR) 4 mg Tb24 extended release tablet Take 4 tablets (16 mg total) by mouth in the morning. 360 tablet 3     No current facility-administered medications for this visit.   [2]   Allergies  Allergen Reactions    Amlodipine Hives, Palpitations, Rash and Other (See Comments)     Causes fatigue      Doxercalciferol Hives    Fish Containing Products Hives and Shortness Of Breath     Please do not serve fish for meals. Highly allergic         Iron Sucrose Hives    Labetalol Itching and Rash    Metoclopramide Hives and Other (See Comments)  Causes ticks/ jerks    Metoprolol Hives    Nifedipine Hives, Itching and Shortness Of Breath    Other Swelling     Suture material, localized swelling at closure site    Sulfamethoxazole-Trimethoprim Rash and Other (See Comments)     Renal failure      Vancomycin Hives and Other (See Comments)     Patient states vancomycin caused kidney injury    Calcitonin     Peanut Oil Other (See Comments)    Potassium Clavulanate     Azathioprine  Itching and Rash    Chlorhexidine Itching     Pre-op wipes only    Clindamycin Other (See Comments) and Palpitations     Chest pain    Hydralazine  Itching and Dizziness           Sensipar [Cinacalcet] Hives

## 2024-03-10 MED FILL — DILTIAZEM CD 120 MG CAPSULE,EXTENDED RELEASE 24 HR: ORAL | 30 days supply | Qty: 30 | Fill #2

## 2024-03-10 MED FILL — BIKTARVY 50 MG-200 MG-25 MG TABLET: ORAL | 30 days supply | Qty: 30 | Fill #3

## 2024-03-10 MED FILL — PREDNISONE 5 MG TABLET: ORAL | 90 days supply | Qty: 90 | Fill #3

## 2024-03-11 DIAGNOSIS — Z940 Kidney transplant status: Principal | ICD-10-CM
